# Patient Record
Sex: Female | Born: 1980 | Race: Black or African American | Hispanic: No | Marital: Single | Smoking: Never smoker
Health system: Southern US, Community
[De-identification: ages and names within clinical notes are randomized; demographics above are authoritative.]

## PROBLEM LIST (undated history)

## (undated) DIAGNOSIS — Z0389 Encounter for observation for other suspected diseases and conditions ruled out: Secondary | ICD-10-CM

## (undated) DIAGNOSIS — IMO0001 Reserved for inherently not codable concepts without codable children: Secondary | ICD-10-CM

## (undated) DIAGNOSIS — J42 Unspecified chronic bronchitis: Secondary | ICD-10-CM

## (undated) DIAGNOSIS — E785 Hyperlipidemia, unspecified: Secondary | ICD-10-CM

## (undated) DIAGNOSIS — J45909 Unspecified asthma, uncomplicated: Secondary | ICD-10-CM

## (undated) DIAGNOSIS — M1990 Unspecified osteoarthritis, unspecified site: Secondary | ICD-10-CM

## (undated) DIAGNOSIS — D649 Anemia, unspecified: Secondary | ICD-10-CM

## (undated) DIAGNOSIS — J449 Chronic obstructive pulmonary disease, unspecified: Secondary | ICD-10-CM

## (undated) DIAGNOSIS — G2581 Restless legs syndrome: Secondary | ICD-10-CM

## (undated) DIAGNOSIS — Z9889 Other specified postprocedural states: Secondary | ICD-10-CM

## (undated) DIAGNOSIS — I219 Acute myocardial infarction, unspecified: Secondary | ICD-10-CM

## (undated) DIAGNOSIS — R112 Nausea with vomiting, unspecified: Secondary | ICD-10-CM

## (undated) DIAGNOSIS — I10 Essential (primary) hypertension: Secondary | ICD-10-CM

## (undated) DIAGNOSIS — I739 Peripheral vascular disease, unspecified: Secondary | ICD-10-CM

## (undated) DIAGNOSIS — E109 Type 1 diabetes mellitus without complications: Secondary | ICD-10-CM

## (undated) DIAGNOSIS — G43909 Migraine, unspecified, not intractable, without status migrainosus: Secondary | ICD-10-CM

## (undated) DIAGNOSIS — I509 Heart failure, unspecified: Secondary | ICD-10-CM

## (undated) DIAGNOSIS — N189 Chronic kidney disease, unspecified: Secondary | ICD-10-CM

## (undated) DIAGNOSIS — I2510 Atherosclerotic heart disease of native coronary artery without angina pectoris: Secondary | ICD-10-CM

## (undated) DIAGNOSIS — J189 Pneumonia, unspecified organism: Secondary | ICD-10-CM

## (undated) DIAGNOSIS — R0602 Shortness of breath: Secondary | ICD-10-CM

## (undated) HISTORY — DX: Encounter for observation for other suspected diseases and conditions ruled out: Z03.89

## (undated) HISTORY — DX: Reserved for inherently not codable concepts without codable children: IMO0001

---
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
## 2004-09-29 HISTORY — PX: TUBAL LIGATION: SHX77

## 2013-05-25 ENCOUNTER — Emergency Department (HOSPITAL_COMMUNITY)
Admission: EM | Admit: 2013-05-25 | Discharge: 2013-05-25 | Disposition: A | Discharge status: Home / Self Care | Payer: Medicaid Other | Source: Home / Self Care

## 2013-05-25 ENCOUNTER — Encounter (HOSPITAL_COMMUNITY): Payer: Self-pay | Admitting: Emergency Medicine

## 2013-05-25 DIAGNOSIS — E119 Type 2 diabetes mellitus without complications: Secondary | ICD-10-CM

## 2013-05-25 DIAGNOSIS — L723 Sebaceous cyst: Secondary | ICD-10-CM

## 2013-05-25 DIAGNOSIS — L089 Local infection of the skin and subcutaneous tissue, unspecified: Secondary | ICD-10-CM

## 2013-05-25 DIAGNOSIS — I10 Essential (primary) hypertension: Secondary | ICD-10-CM

## 2013-05-25 HISTORY — DX: Unspecified asthma, uncomplicated: J45.909

## 2013-05-25 HISTORY — DX: Essential (primary) hypertension: I10

## 2013-05-25 LAB — GLUCOSE, CAPILLARY: Glucose-Capillary: 385 mg/dL — ABNORMAL HIGH (ref 70–99)

## 2013-05-25 MED ORDER — INSULIN ASPART 100 UNIT/ML FLEXPEN: 45.0000 [IU] | PEN_INJECTOR | Freq: Three times a day (TID) | SUBCUTANEOUS | Status: DC

## 2013-05-25 MED ORDER — LISINOPRIL 10 MG PO TABS: 20.0000 mg | ORAL_TABLET | Freq: Every day | ORAL | Status: DC

## 2013-05-25 MED ORDER — CLINDAMYCIN HCL 300 MG PO CAPS: 300.0000 mg | ORAL_CAPSULE | Freq: Three times a day (TID) | ORAL | Status: DC

## 2013-05-25 NOTE — ED Notes (Signed)
Visible abscess to left cheek.  White top on bump.  Bump painful.  Patient reports being new to area and out of medicine.  Out of insulin for over a month, out of lisinopril for 2 days.  New to [place], arrived in town this week.

## 2013-05-25 NOTE — ED Provider Notes (Addendum)
CSN: [id number]     Arrival date & time 05/25/13  1537 History   None    Chief Complaint  Patient presents with  . Abscess  . Medication Refill   (Consider location/radiation/quality/duration/timing/severity/associated sxs/prior Treatment) HPI Comments: 32 year old morbidly obese female recently moved to the area presents with a abscess-like structure to the left cheek. She complains of pain and tenderness locally. Denies systemic symptoms. She states that it drained some last night. She also has been out of her insulin and hypertension medications for approximately one month. She is requesting a refill on these. She does not have a physician yet and is waiting on Medicaid for the county to come through.   Past Medical History  Diagnosis Date  . Hypertension   . Diabetes mellitus without complication   . Asthma    Past Surgical History  Procedure Laterality Date  . Tonsillectomy    . Cesarean section    . Finger surgery     No family history on file. History  Substance Use Topics  . Smoking status: Never Smoker   . Smokeless tobacco: Not on file  . Alcohol Use: No   OB History   Grav Para Term Preterm Abortions TAB SAB Ect Mult Living                 Review of Systems  Constitutional: Negative.   Respiratory: Negative.   Cardiovascular: Negative.   Skin: Positive for wound.  Neurological: Negative.     Allergies  Aspirin; Sulfur; and Tramadol  Home Medications   Current Outpatient Rx  Name  Route  Sig  Dispense  Refill  . lisinopril (PRINIVIL,ZESTRIL) 20 MG tablet   Oral   Take 20 mg by mouth 2 (two) times daily.         . clindamycin (CLEOCIN) 300 MG capsule   Oral   Take 1 capsule (300 mg total) by mouth 3 (three) times daily.   21 capsule   0   . insulin aspart (NOVOLOG FLEXPEN) 100 UNIT/ML SOPN FlexPen   Subcutaneous   Inject 45 Units into the skin 3 (three) times daily with meals.   5 pen   0   . insulin aspart (NOVOLOG) 100 UNIT/ML  injection   Subcutaneous   Inject 45 Units into the skin 3 (three) times daily with meals.         Marland Kitchen lisinopril (PRINIVIL,ZESTRIL) 10 MG tablet   Oral   Take 2 tablets (20 mg total) by mouth daily.   60 tablet   0    BP 179/120  Pulse 134  Temp(Src) 98.1 F (36.7 C) (Oral)  Resp 20  SpO2 98%  LMP 04/30/2013 Physical Exam  Nursing note and vitals reviewed. Constitutional: She is oriented to person, place, and time. She appears well-developed and well-nourished. No distress.  HENT:  Head: Normocephalic and atraumatic.  Eyes: EOM are normal.  Neck: Normal range of motion. Neck supple.  Cardiovascular:  Sinus tachycardia  Pulmonary/Chest: Effort normal and breath sounds normal.  Musculoskeletal: She exhibits no edema.  Neurological: She is alert and oriented to person, place, and time. She exhibits normal muscle tone.  Skin: Skin is warm and dry.  There is a 2-1/2-3 cm raised red tender lesion to the left cheek. There is a central white annular crater approximately 3 mm in diameter; no current drainage.  Psychiatric: She has a normal mood and affect.    ED Course  INCISION AND DRAINAGE Date/Time: 05/25/2013 4:40 PM Performed by:  Tylon Kemmerling Authorized by: Phineas Real, Elvera Almario Consent: Verbal consent obtained. Risks and benefits: risks, benefits and alternatives were discussed Consent given by: patient Patient understanding: patient states understanding of the procedure being performed Patient identity confirmed: verbally with patient Type: cyst Body area: head/neck Location details: face Anesthesia: local infiltration Local anesthetic: lidocaine 2% with epinephrine Anesthetic total: 2 ml Patient sedated: no Scalpel size: 11 Incision type: single straight Complexity: simple Drainage: purulent and bloody Drainage amount: scant Wound treatment: wound left open Patient tolerance: Patient tolerated the procedure well with no immediate complications. Comments: A 0.5 cm  incision was made across central aspect of the raised lesion with a #11 blade. Manual expression allowed approximately 1/2 mm of purulent material with a small amount of blood.No further exploration made. The tissue expelled was consistent with cystic wall.   (including critical care time) Labs Review Labs Reviewed  GLUCOSE, CAPILLARY - Abnormal; Notable for the following:    Glucose-Capillary 385 (*)    All other components within normal limits  CULTURE, ROUTINE-ABSCESS   Imaging Review No results found.  MDM   1. Infected cyst of skin   2. T2DM (type 2 diabetes mellitus)   3. HTN (hypertension)    Refill insulin and lisinopril clinamycin for the facial infection Have the wound rechecked in 2 days for any problems or if not improving Warm compresses frequently  Hayden Rasmussen, NP 05/25/13 1703  Hayden Rasmussen, NP 05/25/13 1711  Hayden Rasmussen, NP 05/25/13 1713  Hayden Rasmussen, NP 05/29/13 1945

## 2013-05-28 LAB — CULTURE, ROUTINE-ABSCESS

## 2013-05-28 NOTE — ED Provider Notes (Signed)
Medical screening examination/treatment/procedure(s) were performed by a resident physician or non-physician practitioner and as the supervising physician I was immediately available for consultation/collaboration.  Clementeen Graham, MD   Rodolph Bong, MD 05/28/13 [phone]

## 2013-05-29 ENCOUNTER — Telehealth (HOSPITAL_COMMUNITY): Payer: Self-pay | Admitting: *Deleted

## 2013-05-29 MED ORDER — DOXYCYCLINE HYCLATE 100 MG PO CAPS: 100.0000 mg | ORAL_CAPSULE | Freq: Two times a day (BID) | ORAL | Status: DC

## 2013-05-29 NOTE — ED Notes (Signed)
Abscess culture L face: Mod. MRSA.  Pt. treated with Cleocin-resistant.  Lab shown to Hayden Rasmussen NP and order obtained for Doxycycline 100 mg. BID x 7 days. I called pt.  Pt. verified x 2 and given results. Pt. told to stop Cleocin and take all of Doxycycline. I reviewed the [facility] MRSA instructions with her. She voiced understanding.  Pt. wants Rx. called to [facility]. [place], [place].  I called Rx. to the pharmacist @ [phone]. Vassie Moselle 05/29/2013

## 2013-05-31 NOTE — ED Provider Notes (Signed)
Medical screening examination/treatment/procedure(s) were performed by a resident physician or non-physician practitioner and as the supervising physician I was immediately available for consultation/collaboration.  Meilyn Heindl, MD   Kendyl Bissonnette S Mariette Cowley, MD 05/31/13 0754 

## 2013-09-06 ENCOUNTER — Emergency Department (HOSPITAL_COMMUNITY): Payer: Medicaid Other

## 2013-09-06 ENCOUNTER — Emergency Department (HOSPITAL_COMMUNITY)
Admission: EM | Admit: 2013-09-06 | Discharge: 2013-09-06 | Disposition: A | Discharge status: Home / Self Care | Payer: Medicaid Other | Attending: Emergency Medicine | Admitting: Emergency Medicine

## 2013-09-06 ENCOUNTER — Encounter (HOSPITAL_COMMUNITY): Payer: Self-pay | Admitting: Emergency Medicine

## 2013-09-06 DIAGNOSIS — R6883 Chills (without fever): Secondary | ICD-10-CM | POA: Insufficient documentation

## 2013-09-06 DIAGNOSIS — Z79899 Other long term (current) drug therapy: Secondary | ICD-10-CM | POA: Insufficient documentation

## 2013-09-06 DIAGNOSIS — Z794 Long term (current) use of insulin: Secondary | ICD-10-CM | POA: Insufficient documentation

## 2013-09-06 DIAGNOSIS — D649 Anemia, unspecified: Secondary | ICD-10-CM

## 2013-09-06 DIAGNOSIS — J45901 Unspecified asthma with (acute) exacerbation: Secondary | ICD-10-CM | POA: Insufficient documentation

## 2013-09-06 DIAGNOSIS — J3489 Other specified disorders of nose and nasal sinuses: Secondary | ICD-10-CM | POA: Insufficient documentation

## 2013-09-06 DIAGNOSIS — I10 Essential (primary) hypertension: Secondary | ICD-10-CM | POA: Insufficient documentation

## 2013-09-06 DIAGNOSIS — Z792 Long term (current) use of antibiotics: Secondary | ICD-10-CM | POA: Insufficient documentation

## 2013-09-06 DIAGNOSIS — E119 Type 2 diabetes mellitus without complications: Secondary | ICD-10-CM | POA: Insufficient documentation

## 2013-09-06 DIAGNOSIS — R000 Tachycardia, unspecified: Secondary | ICD-10-CM

## 2013-09-06 DIAGNOSIS — N289 Disorder of kidney and ureter, unspecified: Secondary | ICD-10-CM

## 2013-09-06 DIAGNOSIS — J40 Bronchitis, not specified as acute or chronic: Secondary | ICD-10-CM

## 2013-09-06 LAB — BASIC METABOLIC PANEL
BUN: 15 mg/dL (ref 6–23)
Chloride: 102 mEq/L (ref 96–112)
Creatinine, Ser: 2.07 mg/dL — ABNORMAL HIGH (ref 0.50–1.10)
GFR calc Af Amer: 35 mL/min — ABNORMAL LOW (ref >90)
GFR calc non Af Amer: 30 mL/min — ABNORMAL LOW (ref >90)
Glucose, Bld: 165 mg/dL — ABNORMAL HIGH (ref 70–99)

## 2013-09-06 LAB — CBC
HCT: 30.7 % — ABNORMAL LOW (ref 36.0–46.0)
Hemoglobin: 9.9 g/dL — ABNORMAL LOW (ref 12.0–15.0)
MCHC: 32.2 g/dL (ref 30.0–36.0)
MCV: 85 fL (ref 78.0–100.0)
RDW: 13.4 % (ref 11.5–15.5)

## 2013-09-06 MED ORDER — ATORVASTATIN CALCIUM 40 MG PO TABS: 40.0000 mg | ORAL_TABLET | Freq: Every day | ORAL | Status: DC

## 2013-09-06 MED ORDER — INSULIN ASPART 100 UNIT/ML FLEXPEN: 45.0000 [IU] | PEN_INJECTOR | Freq: Three times a day (TID) | SUBCUTANEOUS | Status: DC

## 2013-09-06 MED ORDER — HYDROCHLOROTHIAZIDE 25 MG PO TABS: 25.0000 mg | ORAL_TABLET | Freq: Every day | ORAL | Status: DC

## 2013-09-06 MED ORDER — PREDNISONE 20 MG PO TABS: 40.0000 mg | ORAL_TABLET | Freq: Every day | ORAL | Status: DC

## 2013-09-06 MED ORDER — AMLODIPINE BESYLATE 10 MG PO TABS: 10.0000 mg | ORAL_TABLET | Freq: Every day | ORAL | Status: DC

## 2013-09-06 MED ORDER — ALBUTEROL SULFATE HFA 108 (90 BASE) MCG/ACT IN AERS
2.0000 | INHALATION_SPRAY | Freq: Once | RESPIRATORY_TRACT | Status: AC
  Administered 2013-09-06: 2 via RESPIRATORY_TRACT
  Filled 2013-09-06: qty 6.7

## 2013-09-06 MED ORDER — PREDNISONE 20 MG PO TABS
40.0000 mg | ORAL_TABLET | Freq: Once | ORAL | Status: AC
  Administered 2013-09-06: 40 mg via ORAL
  Filled 2013-09-06: qty 2

## 2013-09-06 MED ORDER — GUAIFENESIN-CODEINE 100-10 MG/5ML PO SOLN: 10.0000 mL | Freq: Four times a day (QID) | ORAL | Status: DC | PRN

## 2013-09-06 MED ORDER — SPIRONOLACTONE 25 MG PO TABS: 25.0000 mg | ORAL_TABLET | Freq: Every day | ORAL | Status: DC

## 2013-09-06 NOTE — ED Notes (Signed)
NOTIFIED DR. I. KNAPP OF PATIENTS LAB RESULTS OF CG4 LACTIC ACID = 1.90mmoI/L ,11:30 AM, 09/06/2013.

## 2013-09-06 NOTE — ED Provider Notes (Signed)
CSN: [id number]     Arrival date & time 09/06/13  0910 History   First MD Initiated Contact with Patient 09/06/13 1117     Chief Complaint  Patient presents with  . Cough   (Consider location/radiation/quality/duration/timing/severity/associated sxs/prior Treatment) HPI Yeva Bissette is a 32 y.o. female who presents to emergency department complaining of cough and congestion. States symptoms began 2 weeks ago. 5 days ago she went to an urgent care and was told she may have pneumonia. Patient did not have a chest x-ray done at that time. She was placed on levofloxacin which she has been taking for 4 days now. States that her cough is not improving. States that she has a cough attacks every few minutes and unable to stop. States this cough is making her uncomfortable and she's unable to sleep at night. Patient states today she has been feeling short of breath. Patient states that she has not been taking her blood pressure medications and is running out of her insulin is requesting refills. Patient denies smoking. She states that she has asthma. She states that she does nebulize treatments with her daughter's machine at home. States she had one treatment yesterday and none today. States her daughter is sick with the same. Patient admits to chills, had fever of 4 days ago but none at present. Patient denies any sore throat or chest pain. States she does have nasal congestion. Pt is not taking any other medications for her symptoms.   Past Medical History  Diagnosis Date  . Hypertension   . Diabetes mellitus without complication   . Asthma    Past Surgical History  Procedure Laterality Date  . Tonsillectomy    . Cesarean section    . Finger surgery     History reviewed. No pertinent family history. History  Substance Use Topics  . Smoking status: Never Smoker   . Smokeless tobacco: Not on file  . Alcohol Use: No   OB History   Grav Para Term Preterm Abortions TAB SAB Ect Mult Living                  Review of Systems  Constitutional: Positive for chills. Negative for fever.  HENT: Positive for congestion. Negative for ear pain, sinus pressure, sneezing, sore throat, trouble swallowing and voice change.   Respiratory: Positive for cough, chest tightness and shortness of breath. Negative for wheezing.   Cardiovascular: Negative for chest pain, palpitations and leg swelling.  Gastrointestinal: Negative for nausea, vomiting, abdominal pain and diarrhea.  Genitourinary: Negative for dysuria and flank pain.  Musculoskeletal: Negative for arthralgias, myalgias, neck pain and neck stiffness.  Skin: Negative for rash.  Neurological: Negative for dizziness, weakness and headaches.  All other systems reviewed and are negative.    Allergies  Aspirin; Sulfur; and Tramadol  Home Medications   Current Outpatient Rx  Name  Route  Sig  Dispense  Refill  . doxycycline (VIBRAMYCIN) 100 MG capsule   Oral   Take 1 capsule (100 mg total) by mouth 2 (two) times daily. Call pt when Rx ready   14 capsule   0   . doxycycline (VIBRAMYCIN) 100 MG capsule   Oral   Take 1 capsule (100 mg total) by mouth 2 (two) times daily.   20 capsule   0   . insulin aspart (NOVOLOG FLEXPEN) 100 UNIT/ML SOPN FlexPen   Subcutaneous   Inject 45 Units into the skin 3 (three) times daily with meals.   5 pen  0   . insulin aspart (NOVOLOG) 100 UNIT/ML injection   Subcutaneous   Inject 45 Units into the skin 3 (three) times daily with meals.         Marland Kitchen lisinopril (PRINIVIL,ZESTRIL) 10 MG tablet   Oral   Take 2 tablets (20 mg total) by mouth daily.   60 tablet   0   . lisinopril (PRINIVIL,ZESTRIL) 20 MG tablet   Oral   Take 20 mg by mouth 2 (two) times daily.          BP 190/105  Pulse 120  Temp(Src) 97.7 F (36.5 C) (Oral)  Resp 18  SpO2 100%  LMP 07/30/2013 Physical Exam  Nursing note and vitals reviewed. Constitutional: She appears well-developed and well-nourished. No distress.   HENT:  Head: Normocephalic.  Right Ear: External ear normal.  Left Ear: External ear normal.  Nose: Nose normal.  Mouth/Throat: Oropharynx is clear and moist. No oropharyngeal exudate.  Eyes: Conjunctivae are normal.  Neck: Neck supple.  Cardiovascular: Normal rate, regular rhythm and normal heart sounds.   Pulmonary/Chest: Effort normal and breath sounds normal. No respiratory distress. She has no rales.  Abdominal: Soft. Bowel sounds are normal. She exhibits no distension. There is no tenderness. There is no rebound.  Musculoskeletal: She exhibits no edema.  Neurological: She is alert.  Skin: Skin is warm and dry.  Psychiatric: She has a normal mood and affect. Her behavior is normal.    ED Course  Procedures (including critical care time) Labs Review Labs Reviewed  CBC - Abnormal; Notable for the following:    RBC 3.61 (*)    Hemoglobin 9.9 (*)    HCT 30.7 (*)    Platelets 494 (*)    All other components within normal limits  BASIC METABOLIC PANEL - Abnormal; Notable for the following:    Sodium 133 (*)    Glucose, Bld 165 (*)    Creatinine, Ser 2.07 (*)    GFR calc non Af Amer 30 (*)    GFR calc Af Amer 35 (*)    All other components within normal limits  CG4 I-STAT (LACTIC ACID)   Imaging Review Dg Chest 2 View  09/06/2013   CLINICAL DATA:  Cough, fever.  EXAM: CHEST  2 VIEW  COMPARISON:  None.  FINDINGS: The heart size and mediastinal contours are within normal limits. No pneumothorax or pleural effusion is noted. Bilateral peribronchial thickening is noted concerning for bronchitis or possibly perihilar pneumonitis. The visualized skeletal structures are unremarkable.  IMPRESSION: Findings consistent with either bilateral bronchitis or perihilar pneumonitis.   Electronically Signed   By: Roque Lias M.D.   On: 09/06/2013 11:07    EKG Interpretation   None       MDM   1. Bronchitis   2. Tachycardia   3. Anemia   4. Renal insufficiency     Patient with  persistent cough for 2 weeks, worsening. She is already on antibiotics, states cough is not improving. Patient persistently coughing in the room. Ordered an inhaler, 2 puffs. Patient's chest x-ray is as described above. Her lab work shows elevated creatinine and hemoglobin of 9.9. Patient is aware, states she has history of the same. Patient just moved to the area does not have a primary care Dr. She has not been taking any of her medications. Ran out of her blood pressure and insulin medications. Will refill. Will give numbers to try find a primary care Dr. At this time I do not think  patient needs admission. Her blood pressure came down with recheck, she will need to continue to take her blood pressure medications. Her HR still elevated, most likely due to dehydration and persistent coughing. I ordered her prednisone, explained may elevate her blood sugars. I also considered PE, given tachycardic and SOB, however, she has no risk factors, oxygen sat 100% on RA, and at this time, most likely diagnosis is bronchitis. She does have hx of asthma.    Lottie Mussel, PA-C 09/06/13 1310

## 2013-09-06 NOTE — ED Provider Notes (Signed)
Medical screening examination/treatment/procedure(s) were performed by non-physician practitioner and as supervising physician I was immediately available for consultation/collaboration.  EKG Interpretation   None       Devoria Albe, MD, Armando Gang   Ward Givens, MD 09/06/13 1320

## 2013-09-06 NOTE — ED Notes (Addendum)
Pt sts continued cough with pain with cough x 5 days; pt sts taking antibiotics x 4 days for PNA but not helping; pt sts some SOB today; pt sts not taking BP meds x months

## 2013-09-15 ENCOUNTER — Emergency Department (HOSPITAL_COMMUNITY): Payer: Medicaid Other

## 2013-09-15 ENCOUNTER — Encounter (HOSPITAL_COMMUNITY): Payer: Self-pay | Admitting: Emergency Medicine

## 2013-09-15 ENCOUNTER — Inpatient Hospital Stay (HOSPITAL_COMMUNITY)
Admission: EM | Admit: 2013-09-15 | Discharge: 2013-09-22 | DRG: 193 | Disposition: A | Discharge status: Home / Self Care | Payer: Medicaid Other | Attending: Internal Medicine | Admitting: Internal Medicine

## 2013-09-15 DIAGNOSIS — I5043 Acute on chronic combined systolic (congestive) and diastolic (congestive) heart failure: Secondary | ICD-10-CM

## 2013-09-15 DIAGNOSIS — E1129 Type 2 diabetes mellitus with other diabetic kidney complication: Secondary | ICD-10-CM | POA: Diagnosis present

## 2013-09-15 DIAGNOSIS — I509 Heart failure, unspecified: Secondary | ICD-10-CM | POA: Diagnosis not present

## 2013-09-15 DIAGNOSIS — T380X5A Adverse effect of glucocorticoids and synthetic analogues, initial encounter: Secondary | ICD-10-CM | POA: Diagnosis present

## 2013-09-15 DIAGNOSIS — D649 Anemia, unspecified: Secondary | ICD-10-CM | POA: Diagnosis present

## 2013-09-15 DIAGNOSIS — R600 Localized edema: Secondary | ICD-10-CM

## 2013-09-15 DIAGNOSIS — IMO0002 Reserved for concepts with insufficient information to code with codable children: Secondary | ICD-10-CM | POA: Diagnosis present

## 2013-09-15 DIAGNOSIS — R0602 Shortness of breath: Secondary | ICD-10-CM

## 2013-09-15 DIAGNOSIS — D638 Anemia in other chronic diseases classified elsewhere: Secondary | ICD-10-CM | POA: Diagnosis present

## 2013-09-15 DIAGNOSIS — I428 Other cardiomyopathies: Secondary | ICD-10-CM | POA: Diagnosis present

## 2013-09-15 DIAGNOSIS — I429 Cardiomyopathy, unspecified: Secondary | ICD-10-CM

## 2013-09-15 DIAGNOSIS — R9439 Abnormal result of other cardiovascular function study: Secondary | ICD-10-CM

## 2013-09-15 DIAGNOSIS — I129 Hypertensive chronic kidney disease with stage 1 through stage 4 chronic kidney disease, or unspecified chronic kidney disease: Secondary | ICD-10-CM | POA: Diagnosis present

## 2013-09-15 DIAGNOSIS — R059 Cough, unspecified: Secondary | ICD-10-CM

## 2013-09-15 DIAGNOSIS — J441 Chronic obstructive pulmonary disease with (acute) exacerbation: Secondary | ICD-10-CM | POA: Diagnosis present

## 2013-09-15 DIAGNOSIS — E860 Dehydration: Secondary | ICD-10-CM | POA: Diagnosis present

## 2013-09-15 DIAGNOSIS — R000 Tachycardia, unspecified: Secondary | ICD-10-CM | POA: Diagnosis present

## 2013-09-15 DIAGNOSIS — R112 Nausea with vomiting, unspecified: Secondary | ICD-10-CM | POA: Diagnosis present

## 2013-09-15 DIAGNOSIS — I10 Essential (primary) hypertension: Secondary | ICD-10-CM | POA: Diagnosis present

## 2013-09-15 DIAGNOSIS — Z6841 Body Mass Index (BMI) 40.0 and over, adult: Secondary | ICD-10-CM

## 2013-09-15 DIAGNOSIS — M19049 Primary osteoarthritis, unspecified hand: Secondary | ICD-10-CM | POA: Diagnosis present

## 2013-09-15 DIAGNOSIS — N189 Chronic kidney disease, unspecified: Secondary | ICD-10-CM

## 2013-09-15 DIAGNOSIS — E785 Hyperlipidemia, unspecified: Secondary | ICD-10-CM | POA: Diagnosis present

## 2013-09-15 DIAGNOSIS — E1029 Type 1 diabetes mellitus with other diabetic kidney complication: Secondary | ICD-10-CM | POA: Diagnosis present

## 2013-09-15 DIAGNOSIS — R609 Edema, unspecified: Secondary | ICD-10-CM

## 2013-09-15 DIAGNOSIS — I519 Heart disease, unspecified: Secondary | ICD-10-CM

## 2013-09-15 DIAGNOSIS — I5041 Acute combined systolic (congestive) and diastolic (congestive) heart failure: Secondary | ICD-10-CM | POA: Diagnosis present

## 2013-09-15 DIAGNOSIS — R0902 Hypoxemia: Secondary | ICD-10-CM

## 2013-09-15 DIAGNOSIS — N049 Nephrotic syndrome with unspecified morphologic changes: Secondary | ICD-10-CM | POA: Diagnosis present

## 2013-09-15 DIAGNOSIS — E119 Type 2 diabetes mellitus without complications: Secondary | ICD-10-CM

## 2013-09-15 DIAGNOSIS — E1065 Type 1 diabetes mellitus with hyperglycemia: Secondary | ICD-10-CM | POA: Diagnosis present

## 2013-09-15 DIAGNOSIS — N179 Acute kidney failure, unspecified: Secondary | ICD-10-CM

## 2013-09-15 DIAGNOSIS — I2510 Atherosclerotic heart disease of native coronary artery without angina pectoris: Secondary | ICD-10-CM | POA: Diagnosis present

## 2013-09-15 DIAGNOSIS — R05 Cough: Secondary | ICD-10-CM | POA: Insufficient documentation

## 2013-09-15 DIAGNOSIS — N184 Chronic kidney disease, stage 4 (severe): Secondary | ICD-10-CM | POA: Diagnosis present

## 2013-09-15 DIAGNOSIS — J189 Pneumonia, unspecified organism: Principal | ICD-10-CM

## 2013-09-15 DIAGNOSIS — J45901 Unspecified asthma with (acute) exacerbation: Secondary | ICD-10-CM

## 2013-09-15 DIAGNOSIS — N058 Unspecified nephritic syndrome with other morphologic changes: Secondary | ICD-10-CM | POA: Diagnosis present

## 2013-09-15 DIAGNOSIS — J9600 Acute respiratory failure, unspecified whether with hypoxia or hypercapnia: Secondary | ICD-10-CM | POA: Diagnosis present

## 2013-09-15 HISTORY — DX: Unspecified chronic bronchitis: J42

## 2013-09-15 HISTORY — DX: Chronic kidney disease, unspecified: N18.9

## 2013-09-15 HISTORY — DX: Anemia, unspecified: D64.9

## 2013-09-15 HISTORY — DX: Shortness of breath: R06.02

## 2013-09-15 HISTORY — DX: Hyperlipidemia, unspecified: E78.5

## 2013-09-15 HISTORY — DX: Unspecified osteoarthritis, unspecified site: M19.90

## 2013-09-15 HISTORY — DX: Pneumonia, unspecified organism: J18.9

## 2013-09-15 HISTORY — DX: Migraine, unspecified, not intractable, without status migrainosus: G43.909

## 2013-09-15 LAB — CBC WITH DIFFERENTIAL/PLATELET
Basophils Absolute: 0 K/uL (ref 0.0–0.1)
Basophils Relative: 1 % (ref 0–1)
Eosinophils Absolute: 0 10*3/uL (ref 0.0–0.7)
Eosinophils Relative: 0 % (ref 0–5)
HCT: 30 % — ABNORMAL LOW (ref 36.0–46.0)
Hemoglobin: 9.9 g/dL — ABNORMAL LOW (ref 12.0–15.0)
Lymphocytes Relative: 44 % (ref 12–46)
Lymphs Abs: 1.8 10*3/uL (ref 0.7–4.0)
MCH: 27.2 pg (ref 26.0–34.0)
MCHC: 33 g/dL (ref 30.0–36.0)
MCV: 82.4 fL (ref 78.0–100.0)
Monocytes Absolute: 0.3 K/uL (ref 0.1–1.0)
Monocytes Relative: 7 % (ref 3–12)
Neutro Abs: 2 K/uL (ref 1.7–7.7)
Neutrophils Relative %: 49 % (ref 43–77)
Platelets: 312 K/uL (ref 150–400)
RBC: 3.64 MIL/uL — ABNORMAL LOW (ref 3.87–5.11)
RDW: 13.6 % (ref 11.5–15.5)
WBC: 4 K/uL (ref 4.0–10.5)

## 2013-09-15 LAB — CBC
Hemoglobin: 9 g/dL — ABNORMAL LOW (ref 12.0–15.0)
MCH: 27.5 pg (ref 26.0–34.0)
MCV: 82 fL (ref 78.0–100.0)
Platelets: 263 10*3/uL (ref 150–400)
RBC: 3.27 MIL/uL — ABNORMAL LOW (ref 3.87–5.11)
RDW: 13.6 % (ref 11.5–15.5)
WBC: 7.7 10*3/uL (ref 4.0–10.5)

## 2013-09-15 LAB — URINE MICROSCOPIC-ADD ON

## 2013-09-15 LAB — URINALYSIS, ROUTINE W REFLEX MICROSCOPIC
Glucose, UA: 250 mg/dL — AB
Ketones, ur: NEGATIVE mg/dL
Leukocytes, UA: NEGATIVE
Nitrite: NEGATIVE
Specific Gravity, Urine: 1.015 (ref 1.005–1.030)
pH: 6 (ref 5.0–8.0)

## 2013-09-15 LAB — STREP PNEUMONIAE URINARY ANTIGEN: Strep Pneumo Urinary Antigen: NEGATIVE

## 2013-09-15 LAB — COMPREHENSIVE METABOLIC PANEL WITH GFR
ALT: 13 U/L (ref 0–35)
AST: 20 U/L (ref 0–37)
Alkaline Phosphatase: 76 U/L (ref 39–117)
CO2: 21 meq/L (ref 19–32)
Chloride: 99 meq/L (ref 96–112)
GFR calc non Af Amer: 22 mL/min — ABNORMAL LOW (ref >90)
Sodium: 131 meq/L — ABNORMAL LOW (ref 135–145)
Total Bilirubin: 0.2 mg/dL — ABNORMAL LOW (ref 0.3–1.2)

## 2013-09-15 LAB — COMPREHENSIVE METABOLIC PANEL
Albumin: 1.4 g/dL — ABNORMAL LOW (ref 3.5–5.2)
BUN: 39 mg/dL — ABNORMAL HIGH (ref 6–23)
Calcium: 8.1 mg/dL — ABNORMAL LOW (ref 8.4–10.5)
Creatinine, Ser: 2.72 mg/dL — ABNORMAL HIGH (ref 0.50–1.10)
GFR calc Af Amer: 25 mL/min — ABNORMAL LOW (ref >90)
Glucose, Bld: 184 mg/dL — ABNORMAL HIGH (ref 70–99)
Potassium: 3.6 mEq/L (ref 3.5–5.1)
Total Protein: 6.1 g/dL (ref 6.0–8.3)

## 2013-09-15 LAB — CREATININE, SERUM
Creatinine, Ser: 2.66 mg/dL — ABNORMAL HIGH (ref 0.50–1.10)
GFR calc Af Amer: 26 mL/min — ABNORMAL LOW (ref >90)
GFR calc non Af Amer: 22 mL/min — ABNORMAL LOW (ref >90)

## 2013-09-15 LAB — GLUCOSE, CAPILLARY: Glucose-Capillary: 179 mg/dL — ABNORMAL HIGH (ref 70–99)

## 2013-09-15 LAB — PRO B NATRIURETIC PEPTIDE: Pro B Natriuretic peptide (BNP): 7879 pg/mL — ABNORMAL HIGH (ref 0–125)

## 2013-09-15 MED ORDER — IPRATROPIUM BROMIDE 0.02 % IN SOLN
1.0000 mg | Freq: Once | RESPIRATORY_TRACT | Status: AC
  Administered 2013-09-15: 1 mg via RESPIRATORY_TRACT
  Filled 2013-09-15: qty 5

## 2013-09-15 MED ORDER — ALBUTEROL SULFATE (5 MG/ML) 0.5% IN NEBU
2.5000 mg | INHALATION_SOLUTION | RESPIRATORY_TRACT | Status: DC
  Administered 2013-09-15: 21:00:00 2.5 mg via RESPIRATORY_TRACT

## 2013-09-15 MED ORDER — ALBUTEROL SULFATE (5 MG/ML) 0.5% IN NEBU
5.0000 mg | INHALATION_SOLUTION | Freq: Once | RESPIRATORY_TRACT | Status: DC
  Filled 2013-09-15: qty 1

## 2013-09-15 MED ORDER — SODIUM CHLORIDE 0.9 % IV SOLN
INTRAVENOUS | Status: DC
  Administered 2013-09-16: 12:00:00 75 mL via INTRAVENOUS

## 2013-09-15 MED ORDER — ALBUTEROL SULFATE (5 MG/ML) 0.5% IN NEBU
2.5000 mg | INHALATION_SOLUTION | Freq: Three times a day (TID) | RESPIRATORY_TRACT | Status: DC
  Administered 2013-09-16: 09:00:00 2.5 mg via RESPIRATORY_TRACT
  Filled 2013-09-15: qty 0.5

## 2013-09-15 MED ORDER — IPRATROPIUM BROMIDE 0.02 % IN SOLN
0.5000 mg | Freq: Three times a day (TID) | RESPIRATORY_TRACT | Status: DC
  Administered 2013-09-16: 09:00:00 0.5 mg via RESPIRATORY_TRACT
  Filled 2013-09-15: qty 2.5

## 2013-09-15 MED ORDER — DEXTROSE 5 % IV SOLN: 1.0000 g | INTRAVENOUS | Status: DC

## 2013-09-15 MED ORDER — DEXTROSE 5 % IV SOLN
1.0000 g | Freq: Once | INTRAVENOUS | Status: AC
  Administered 2013-09-15: 1 g via INTRAVENOUS
  Filled 2013-09-15: qty 10

## 2013-09-15 MED ORDER — HYDROCOD POLST-CHLORPHEN POLST 10-8 MG/5ML PO LQCR
5.0000 mL | Freq: Two times a day (BID) | ORAL | Status: DC
  Administered 2013-09-15 – 2013-09-22 (×13): 5 mL via ORAL
  Filled 2013-09-15 (×14): qty 5

## 2013-09-15 MED ORDER — ATORVASTATIN CALCIUM 40 MG PO TABS
40.0000 mg | ORAL_TABLET | Freq: Every day | ORAL | Status: DC
  Administered 2013-09-15 – 2013-09-22 (×7): 40 mg via ORAL
  Filled 2013-09-15 (×8): qty 1

## 2013-09-15 MED ORDER — SODIUM CHLORIDE 0.9 % IV SOLN
1000.0000 mL | INTRAVENOUS | Status: DC
  Administered 2013-09-15: 1000 mL via INTRAVENOUS

## 2013-09-15 MED ORDER — INSULIN ASPART 100 UNIT/ML ~~LOC~~ SOLN
0.0000 [IU] | Freq: Every day | SUBCUTANEOUS | Status: DC
  Administered 2013-09-16 – 2013-09-18 (×2): 2 [IU] via SUBCUTANEOUS
  Administered 2013-09-19 – 2013-09-20 (×2): 4 [IU] via SUBCUTANEOUS

## 2013-09-15 MED ORDER — INSULIN ASPART 100 UNIT/ML ~~LOC~~ SOLN
6.0000 [IU] | Freq: Three times a day (TID) | SUBCUTANEOUS | Status: DC
  Administered 2013-09-16: 08:00:00 6 [IU] via SUBCUTANEOUS

## 2013-09-15 MED ORDER — DEXTROSE 5 % IV SOLN
1.0000 g | INTRAVENOUS | Status: DC
  Administered 2013-09-16 – 2013-09-19 (×4): 1 g via INTRAVENOUS
  Filled 2013-09-15 (×5): qty 10

## 2013-09-15 MED ORDER — ONDANSETRON HCL 4 MG/2ML IJ SOLN: 4.0000 mg | Freq: Four times a day (QID) | INTRAMUSCULAR | Status: DC | PRN

## 2013-09-15 MED ORDER — ENOXAPARIN SODIUM 40 MG/0.4ML ~~LOC~~ SOLN
40.0000 mg | SUBCUTANEOUS | Status: DC
  Administered 2013-09-15 – 2013-09-17 (×3): 40 mg via SUBCUTANEOUS
  Filled 2013-09-15 (×8): qty 0.4

## 2013-09-15 MED ORDER — DEXTROSE 5 % IV SOLN
500.0000 mg | INTRAVENOUS | Status: DC
  Filled 2013-09-15: qty 500

## 2013-09-15 MED ORDER — DEXTROSE 5 % IV SOLN: 500.0000 mg | Freq: Once | INTRAVENOUS | Status: DC

## 2013-09-15 MED ORDER — AMLODIPINE BESYLATE 10 MG PO TABS
10.0000 mg | ORAL_TABLET | Freq: Every day | ORAL | Status: DC
  Administered 2013-09-16 – 2013-09-17 (×2): 10 mg via ORAL
  Filled 2013-09-15 (×2): qty 1

## 2013-09-15 MED ORDER — ALBUTEROL SULFATE HFA 108 (90 BASE) MCG/ACT IN AERS: 2.0000 | INHALATION_SPRAY | RESPIRATORY_TRACT | Status: DC | PRN

## 2013-09-15 MED ORDER — ALBUTEROL SULFATE (5 MG/ML) 0.5% IN NEBU
2.5000 mg | INHALATION_SOLUTION | RESPIRATORY_TRACT | Status: DC
  Filled 2013-09-15: qty 0.5

## 2013-09-15 MED ORDER — INSULIN ASPART 100 UNIT/ML ~~LOC~~ SOLN
0.0000 [IU] | Freq: Three times a day (TID) | SUBCUTANEOUS | Status: DC
  Administered 2013-09-16: 18:00:00 15 [IU] via SUBCUTANEOUS
  Administered 2013-09-16: 20 [IU] via SUBCUTANEOUS
  Administered 2013-09-16: 15 [IU] via SUBCUTANEOUS
  Administered 2013-09-17: 14:00:00 7 [IU] via SUBCUTANEOUS
  Administered 2013-09-17: 11 [IU] via SUBCUTANEOUS
  Administered 2013-09-17: 4 [IU] via SUBCUTANEOUS
  Administered 2013-09-18 (×2): 7 [IU] via SUBCUTANEOUS
  Administered 2013-09-18: 11 [IU] via SUBCUTANEOUS
  Administered 2013-09-19: 09:00:00 4 [IU] via SUBCUTANEOUS
  Administered 2013-09-19: 11 [IU] via SUBCUTANEOUS
  Administered 2013-09-20 – 2013-09-21 (×3): 4 [IU] via SUBCUTANEOUS
  Administered 2013-09-22: 7 [IU] via SUBCUTANEOUS
  Administered 2013-09-22: 11:00:00 4 [IU] via SUBCUTANEOUS

## 2013-09-15 MED ORDER — SODIUM CHLORIDE 0.9 % IV SOLN
1000.0000 mL | Freq: Once | INTRAVENOUS | Status: AC
  Administered 2013-09-15: 1000 mL via INTRAVENOUS

## 2013-09-15 MED ORDER — METHYLPREDNISOLONE SODIUM SUCC 40 MG IJ SOLR
40.0000 mg | Freq: Two times a day (BID) | INTRAMUSCULAR | Status: DC
  Administered 2013-09-15 – 2013-09-17 (×4): 40 mg via INTRAVENOUS
  Filled 2013-09-15 (×6): qty 1

## 2013-09-15 MED ORDER — IPRATROPIUM BROMIDE 0.02 % IN SOLN
0.5000 mg | RESPIRATORY_TRACT | Status: DC
  Administered 2013-09-15: 0.5 mg via RESPIRATORY_TRACT
  Filled 2013-09-15: qty 2.5

## 2013-09-15 MED ORDER — ALBUTEROL (5 MG/ML) CONTINUOUS INHALATION SOLN
10.0000 mg/h | INHALATION_SOLUTION | RESPIRATORY_TRACT | Status: AC
  Administered 2013-09-15: 10 mg/h via RESPIRATORY_TRACT
  Filled 2013-09-15: qty 20

## 2013-09-15 NOTE — H&P (Signed)
[facility] History and Physical  [name] ZOX:[id number] DOB: October 21, 1980 DOA: 09/15/2013  Referring physician: Dr. Quita Skye PCP: Pcp Not In System   Chief Complaint: Cough, vomiting, dyspnea, wheezing   History of Present Illness: [name] is an 32 y.o. female with a PMH of asthma, HTN, DM who originally presented to [facility] on 09/02/13 with dyspnea and cough and then presented to the ER on 09/06/13 with persistent symptoms.  CXR done on that day showed bronchitis versus a perihilar pneumonitis.  She was started on prednisone and instructed to complete her previously prescribed course of Levaquin.  She now returns after completing her Levaquin with complaints of persistent symptoms including nausea, vomiting, diarrhea, lightheadedness, persistent shortness of breath and cough. She has used her home MDI and nebulizer with mild improvement in her shortness of breath, but no change in the cough. She is having posttussive emesis with coughing spells. She has had decreased oral intake. Upon evaluation in the ED, the patient was noted to have acute renal failure with creatinine of 2.72. Chest x-ray reveals mild worsening of the patchy bilateral airspace process, worse over the left base, likely due to infection.  She has not had a flu shot this year, and denies sick contacts.  Review of Systems: Constitutional: + fever, + chills;  Appetite decreased; No weight loss, no weight gain, + fatigue.  HEENT: No blurry vision, no diplopia, no pharyngitis, no dysphagia CV: No chest pain, no palpitations, no PND.  Resp: + SOB, + cough productive of thick brown sputum, + pleuritic pain. GI: + nausea, + vomiting, + diarrhea, no melena, no hematochezia, no constipation.  GU: No dysuria, no hematuria, no frequency, no urgency. MSK: no myalgias, no arthralgias.  Neuro:  No headache, no focal neurological deficits, no history of seizures.  Psych: No depression, no anxiety.  Endo: No heat intolerance, no cold  intolerance, no polyuria, no polydipsia, LMP 1 week ago Skin: No rashes, no skin lesions.  Heme: No easy bruising.  Travel history: No recent travel.  Past Medical History Past Medical History  Diagnosis Date  . Hypertension   . Diabetes mellitus without complication   . Asthma   . Hyperlipidemia   . Migraine      Past Surgical History Past Surgical History  Procedure Laterality Date  . Tonsillectomy    . Cesarean section    . Finger surgery       Social History: History   Social History  . Marital Status: Single    Spouse Name: N/A    Number of Children: 2  . Years of Education: N/A   Occupational History  . Student    Social History Main Topics  . Smoking status: Never Smoker   . Smokeless tobacco: Not on file  . Alcohol Use: Yes     Comment: Occasional wine.  . Drug Use: No  . Sexual Activity: Yes    Partners: Male    Birth Control/ Protection: Other-see comments     Comment: S/P tubal ligation   Other Topics Concern  . Not on file   Social History Narrative   Single.  Moved here from [place].  Lives with 2 children.      Family History:  Family History  Problem Relation Age of Onset  . Diabetes Mother   . Diabetes Maternal Grandmother   . Cancer Paternal Grandmother   . Hypertension Father   . Hyperlipidemia Father     Allergies: Aspirin; Sulfur; and Tramadol  Meds: Prior  to Admission medications   Medication Sig Start Date End Date Taking? Authorizing Provider  albuterol (PROVENTIL HFA;VENTOLIN HFA) 108 (90 BASE) MCG/ACT inhaler Inhale into the lungs every 6 (six) hours as needed for wheezing or shortness of breath.   Yes Historical Provider, MD  albuterol (PROVENTIL) (2.5 MG/3ML) 0.083% nebulizer solution Take 2.5 mg by nebulization every 6 (six) hours as needed for wheezing or shortness of breath.   Yes Historical Provider, MD  amLODipine (NORVASC) 10 MG tablet Take 1 tablet (10 mg total) by mouth daily. 09/06/13  Yes Tatyana A Kirichenko,  PA-C  atorvastatin (LIPITOR) 40 MG tablet Take 40 mg by mouth daily.   Yes Historical Provider, MD  DM-Doxylamine-Acetaminophen 15-6.25-325 MG/15ML LIQD Take 15 mLs by mouth every 6 (six) hours as needed (for cold symptoms).   Yes Historical Provider, MD  hydrochlorothiazide (HYDRODIURIL) 25 MG tablet Take 25 mg by mouth daily.   Yes Historical Provider, MD  insulin aspart (NOVOLOG FLEXPEN) 100 UNIT/ML SOPN FlexPen Inject 35 Units into the skin 3 (three) times daily with meals.    Yes Historical Provider, MD  spironolactone (ALDACTONE) 25 MG tablet Take 25 mg by mouth daily.   Yes Historical Provider, MD  predniSONE (DELTASONE) 20 MG tablet Take 2 tablets (40 mg total) by mouth daily. 09/06/13   Lottie Mussel, PA-C    Physical Exam: Filed Vitals:   09/15/13 1443 09/15/13 1445 09/15/13 1506 09/15/13 1530  BP:  123/74 118/78 124/76  Pulse:  93  100  Temp:      TempSrc:      Resp:   24 23  Weight:      SpO2: 93% 92% 97% 94%     Physical Exam: Blood pressure 124/76, pulse 100, temperature 97.3 F (36.3 C), temperature source Oral, resp. rate 23, weight 142.747 kg (314 lb 11.2 oz), last menstrual period 09/08/2013, SpO2 94.00%. Gen: Mild distress secondary to paroxysms of cough. Head: Normocephalic, atraumatic. Eyes: PERRL, EOMI, sclerae nonicteric. Mouth: Oropharynx shows white coating to the tongue, postnasal drainage noted. Neck: Supple, no thyromegaly, no lymphadenopathy, no jugular venous distention. Chest: Lungs diminished with a few scattered crackles/rhonchi. CV: Heart sounds are tachycardic but regular. No murmurs, rubs, or gallops. Abdomen: Soft, nontender, nondistended with normal active bowel sounds. Extremities: Extremities are with 2-3+ edema bilaterally. Skin: Warm and dry. Neuro: Alert and oriented times 3; cranial nerves II through XII grossly intact. Psych: Mood and affect normal.  Labs on Admission:  Basic Metabolic Panel:  Recent Labs Lab 09/15/13 1244   NA 131*  K 3.6  CL 99  CO2 21  GLUCOSE 184*  BUN 39*  CREATININE 2.72*  CALCIUM 8.1*   Liver Function Tests:  Recent Labs Lab 09/15/13 1244  AST 20  ALT 13  ALKPHOS 76  BILITOT 0.2*  PROT 6.1  ALBUMIN 1.4*   CBC:  Recent Labs Lab 09/15/13 1244  WBC 4.0  NEUTROABS 2.0  HGB 9.9*  HCT 30.0*  MCV 82.4  PLT 312   Radiological Exams on Admission: Dg Chest 2 View  09/15/2013   CLINICAL DATA:  Cough for 2-1/2 weeks with shortness of breath and fever at times.  EXAM: CHEST  2 VIEW  COMPARISON:  09/16/2013  FINDINGS: Lungs are adequately inflated with bilateral patchy airspace opacification most prominent over the left base with mild overall worsening. Findings are likely due to infection. No evidence of effusion. Mild stable cardiomegaly. Remainder of the exam is unchanged.  IMPRESSION: Mild worsening of a patchy bilateral  airspace process worse over the left base likely due to infection.  Mild stable cardiomegaly.   Electronically Signed   By: Elberta Fortis M.D.   On: 09/15/2013 15:58    EKG: Ordered.  Assessment/Plan Principal Problem:   Community acquired pneumonia The patient has failed outpatient therapy with Levaquin. She has been started on Rocephin/azithromycin, which we will continue for now. No evidence of healthcare associated pneumonia. Given recent bouts of nausea/vomiting, aspiration pneumonia is a possibility. We'll check HIV antibody, strep pneumonia antigen, urinary Legionella antigen, blood cultures, and sputum culture. Tussionex ordered for cough. Active Problems:   Lower extremity edema Check pro BNP and consider two-dimensional echocardiography given evidence of poorly controlled diabetes, hypertension and chronic kidney disease. Check 12-lead EKG to evaluate for hypertensive heart disease/LVH.   Nausea and vomiting Antinausea medication ordered as needed. Hydrate.   Diabetes mellitus Check hemoglobin A1c and start on insulin resistant sliding scale  with 6 units of NovoLog meal coverage.   Normocytic anemia Likely from menstrual losses.   Acute renal failure / dehydration Hold hydrochlorothiazide and spironolactone. Hydrate. Suspect underlying chronic kidney disease complicated by dehydration. Creatinine was 2.07 on 09/06/2013. Albumin levels low, followup urinalysis to evaluate for proteinuria, rule out nephrotic syndrome.   Hyperlipidemia LFTs okay, continue statin.   Hypertension Continue Norvasc but hold hydrochlorothiazide and spironolactone.   Cough Tussionex 5 cc every 12 hours ordered.   Asthma exacerbation Continue nebulized bronchodilator therapy. Has completed a course of prednisone. We'll start on Solu-Medrol 40 mg every 12 hours.  Code Status: Full. Family Communication: No family at bedside. Disposition Plan: Home when stable.   Time spent: 1 hour.  Dustie Brittle [facility] Pager [phone]  If 7PM-7AM, please contact night-coverage [email or web address] Password [facility] 09/15/2013, 5:36 PM

## 2013-09-15 NOTE — ED Notes (Signed)
Per pt sts she was recently treated with PNA and has gotten worse. sts that she is still coughing, and now is having vomiting and diarrhea. sts weak when she stands up.

## 2013-09-15 NOTE — ED Notes (Signed)
IV team unsuccessful at starting IV. 

## 2013-09-15 NOTE — ED Notes (Signed)
Spoke with Admitting MD, pt to be discontinued continouse neb treatment for admission.

## 2013-09-15 NOTE — ED Provider Notes (Signed)
CSN: [id number]     Arrival date & time 09/15/13  1158 History   First MD Initiated Contact with Patient 09/15/13 1504     Chief Complaint  Patient presents with  . Cough  . Vomiting   (Consider location/radiation/quality/duration/timing/severity/associated sxs/prior Treatment) Patient is a 32 y.o. female presenting with cough. The history is provided by the patient and medical records. No language interpreter was used.  Cough Associated symptoms: headaches, shortness of breath and wheezing   Associated symptoms: no chest pain, no diaphoresis, no fever and no rash     Teirra Carapia is a 32 y.o. female  with a hx of asthma, HTN, IDDM, migraine headaches presents to the Emergency Department complaining of gradual, persistent, progressively worsening N/V/D with associated lightheaded feeling and persistent SOB and cough onset yesterday morning.  She reports she finished her levaquin as directed without relief.  She reports home use of MDI and nebulizer with mild improvement in her SOB but no change in her cough.  She reports severe coughing with laying flat.  She reports post-tussive emesis after most of her coughing spells.  This is not the only time patient vomits.  She reports decreased PO intake with most of the vomiting containing mucus, but emesis is nonbloody and nonbilious.  Pt with headache directly associated with coughing.  Pt denies fever, neck pain, neck stiffness, abd pain, dysuria, hematuria, rash, syncope.  Pt with multiple bouts of diarrhea in the AM until treated with Immodium and only 2 bouts of emesis. Patient denies sick contacts.  Pt was seen on 09/02/13 at [facility] for SOB, coughing, chest pain with cough only.  She returned to the ED on 09/06/13 with the same symptoms including persistent SOB. CXR on that day showed bronchitis vs perihilar pneumonitis.  At that time with prednisone and instructed to finish her Levaquin.  Past Medical History  Diagnosis Date  . Hypertension   .  Diabetes mellitus without complication   . Asthma    Past Surgical History  Procedure Laterality Date  . Tonsillectomy    . Cesarean section    . Finger surgery     History reviewed. No pertinent family history. History  Substance Use Topics  . Smoking status: Never Smoker   . Smokeless tobacco: Not on file  . Alcohol Use: No   OB History   Grav Para Term Preterm Abortions TAB SAB Ect Mult Living                 Review of Systems  Constitutional: Positive for appetite change and fatigue. Negative for fever, diaphoresis and unexpected weight change.  HENT: Negative for mouth sores.   Eyes: Negative for visual disturbance.  Respiratory: Positive for cough, chest tightness, shortness of breath and wheezing.   Cardiovascular: Negative for chest pain.  Gastrointestinal: Positive for nausea, vomiting and diarrhea. Negative for abdominal pain and constipation.  Endocrine: Negative for polydipsia, polyphagia and polyuria.  Genitourinary: Negative for dysuria, urgency, frequency and hematuria.  Musculoskeletal: Negative for back pain and neck stiffness.  Skin: Negative for rash.  Allergic/Immunologic: Negative for immunocompromised state.  Neurological: Positive for headaches. Negative for syncope and light-headedness.  Hematological: Does not bruise/bleed easily.  Psychiatric/Behavioral: Negative for sleep disturbance. The patient is not nervous/anxious.     Allergies  Aspirin; Sulfur; and Tramadol  Home Medications   Current Outpatient Rx  Name  Route  Sig  Dispense  Refill  . albuterol (PROVENTIL HFA;VENTOLIN HFA) 108 (90 BASE) MCG/ACT inhaler   Inhalation  Inhale into the lungs every 6 (six) hours as needed for wheezing or shortness of breath.         Marland Kitchen albuterol (PROVENTIL) (2.5 MG/3ML) 0.083% nebulizer solution   Nebulization   Take 2.5 mg by nebulization every 6 (six) hours as needed for wheezing or shortness of breath.         Marland Kitchen amLODipine (NORVASC) 10 MG  tablet   Oral   Take 1 tablet (10 mg total) by mouth daily.   30 tablet   1   . atorvastatin (LIPITOR) 40 MG tablet   Oral   Take 40 mg by mouth daily.         Marland Kitchen DM-Doxylamine-Acetaminophen 15-6.25-325 MG/15ML LIQD   Oral   Take 15 mLs by mouth every 6 (six) hours as needed (for cold symptoms).         . hydrochlorothiazide (HYDRODIURIL) 25 MG tablet   Oral   Take 25 mg by mouth daily.         . insulin aspart (NOVOLOG FLEXPEN) 100 UNIT/ML SOPN FlexPen   Subcutaneous   Inject 35 Units into the skin 3 (three) times daily with meals.          Marland Kitchen spironolactone (ALDACTONE) 25 MG tablet   Oral   Take 25 mg by mouth daily.         . predniSONE (DELTASONE) 20 MG tablet   Oral   Take 2 tablets (40 mg total) by mouth daily.   10 tablet   0    BP 124/76  Pulse 100  Temp(Src) 97.3 F (36.3 C) (Oral)  Resp 23  Wt 314 lb 11.2 oz (142.747 kg)  SpO2 94%  LMP 09/08/2013 Physical Exam  Nursing note and vitals reviewed. Constitutional: She is oriented to person, place, and time. She appears well-developed and well-nourished. No distress.  Awake, alert, nontoxic appearance  HENT:  Head: Normocephalic and atraumatic.  Right Ear: Tympanic membrane, external ear and ear canal normal.  Left Ear: Tympanic membrane, external ear and ear canal normal.  Nose: Nose normal. No mucosal edema or rhinorrhea. No epistaxis. Right sinus exhibits no maxillary sinus tenderness and no frontal sinus tenderness. Left sinus exhibits no maxillary sinus tenderness and no frontal sinus tenderness.  Mouth/Throat: Uvula is midline, oropharynx is clear and moist and mucous membranes are normal. Mucous membranes are not pale and not cyanotic. No uvula swelling. No oropharyngeal exudate, posterior oropharyngeal edema, posterior oropharyngeal erythema or tonsillar abscesses.  Eyes: Conjunctivae are normal. Pupils are equal, round, and reactive to light. No scleral icterus.  Neck: Normal range of motion  and full passive range of motion without pain. Neck supple.  Cardiovascular: Normal rate, regular rhythm, normal heart sounds and intact distal pulses.   No murmur heard. Pulmonary/Chest: No stridor. Tachypnea noted. No respiratory distress. She has decreased breath sounds. She has no wheezes. She has rhonchi.  Increased respiratory effort Rhonchi throughout, no wheezes;  ? Possible rales in the bases  Abdominal: Soft. Bowel sounds are normal. She exhibits no distension and no mass. There is no tenderness. There is no rebound and no guarding.  Musculoskeletal: Normal range of motion. She exhibits no edema.  Lymphadenopathy:    She has no cervical adenopathy.  Neurological: She is alert and oriented to person, place, and time. She exhibits normal muscle tone. Coordination normal.  Speech is clear and goal oriented Moves extremities without ataxia  Skin: Skin is warm and dry. No rash noted. She is not diaphoretic. No  erythema.  Psychiatric: She has a normal mood and affect. Her behavior is normal.    ED Course  Procedures (including critical care time) Labs Review Labs Reviewed  CBC WITH DIFFERENTIAL - Abnormal; Notable for the following:    RBC 3.64 (*)    Hemoglobin 9.9 (*)    HCT 30.0 (*)    All other components within normal limits  COMPREHENSIVE METABOLIC PANEL - Abnormal; Notable for the following:    Sodium 131 (*)    Glucose, Bld 184 (*)    BUN 39 (*)    Creatinine, Ser 2.72 (*)    Calcium 8.1 (*)    Albumin 1.4 (*)    Total Bilirubin 0.2 (*)    GFR calc non Af Amer 22 (*)    GFR calc Af Amer 25 (*)    All other components within normal limits  URINALYSIS, ROUTINE W REFLEX MICROSCOPIC   Imaging Review Dg Chest 2 View  09/15/2013   CLINICAL DATA:  Cough for 2-1/2 weeks with shortness of breath and fever at times.  EXAM: CHEST  2 VIEW  COMPARISON:  09/16/2013  FINDINGS: Lungs are adequately inflated with bilateral patchy airspace opacification most prominent over the  left base with mild overall worsening. Findings are likely due to infection. No evidence of effusion. Mild stable cardiomegaly. Remainder of the exam is unchanged.  IMPRESSION: Mild worsening of a patchy bilateral airspace process worse over the left base likely due to infection.  Mild stable cardiomegaly.   Electronically Signed   By: Elberta Fortis M.D.   On: 09/15/2013 15:58    EKG Interpretation   None       MDM   1. CAP (community acquired pneumonia)   2. SOB (shortness of breath)   3. Cough   4. Hypoxia   5. Acute on chronic renal insufficiency      Olesya Wike reports with Hx of asthma and possibly CAP with persistent SOB, N/V/D and generalized weakness.  She reports low function of her kidneys from poor control of her diabetes; but today's lab values are worse than when she was evaluated 9 days ago.  She does not know her baseline creatinine.    4:42 PM Chest x-ray with worsening left lower consolidation. Patient oxygen saturation 92% on room air but when ambulating patient becomes lightheaded and oxygen saturations drop to 87%.  Pt currently on 2L via [place]. Patient also with elevated BUN and creatinine above baseline. Patient reports initial elevation secondary to uncontrolled hyperglycemia.  Anion gap 11 glucose 184.  We'll give fluid bolus and initiate Rocephin and azithromycin as patient has failed outpatient Levaquin. We'll proceed with admission.  PCP: none  5:19 PM Discussed with hospitalist who admit.    Dahlia Client Stachia Slutsky, PA-C 09/15/13 1719

## 2013-09-15 NOTE — ED Notes (Signed)
IV team at bedside 

## 2013-09-15 NOTE — ED Notes (Signed)
Patient states she is unable to void at this time.  

## 2013-09-16 DIAGNOSIS — I428 Other cardiomyopathies: Secondary | ICD-10-CM | POA: Diagnosis present

## 2013-09-16 DIAGNOSIS — E860 Dehydration: Secondary | ICD-10-CM

## 2013-09-16 DIAGNOSIS — I319 Disease of pericardium, unspecified: Secondary | ICD-10-CM

## 2013-09-16 DIAGNOSIS — E119 Type 2 diabetes mellitus without complications: Secondary | ICD-10-CM

## 2013-09-16 DIAGNOSIS — J9600 Acute respiratory failure, unspecified whether with hypoxia or hypercapnia: Secondary | ICD-10-CM

## 2013-09-16 DIAGNOSIS — E785 Hyperlipidemia, unspecified: Secondary | ICD-10-CM

## 2013-09-16 DIAGNOSIS — R112 Nausea with vomiting, unspecified: Secondary | ICD-10-CM

## 2013-09-16 DIAGNOSIS — R0602 Shortness of breath: Secondary | ICD-10-CM

## 2013-09-16 DIAGNOSIS — I10 Essential (primary) hypertension: Secondary | ICD-10-CM

## 2013-09-16 LAB — GLUCOSE, CAPILLARY
Glucose-Capillary: 382 mg/dL — ABNORMAL HIGH (ref 70–99)
Glucose-Capillary: 341 mg/dL — ABNORMAL HIGH (ref 70–99)
Glucose-Capillary: 315 mg/dL — ABNORMAL HIGH (ref 70–99)
Glucose-Capillary: 217 mg/dL — ABNORMAL HIGH (ref 70–99)

## 2013-09-16 LAB — BASIC METABOLIC PANEL
BUN: 37 mg/dL — ABNORMAL HIGH (ref 6–23)
CO2: 17 mEq/L — ABNORMAL LOW (ref 19–32)
Calcium: 8.1 mg/dL — ABNORMAL LOW (ref 8.4–10.5)
Creatinine, Ser: 2.59 mg/dL — ABNORMAL HIGH (ref 0.50–1.10)
GFR calc Af Amer: 27 mL/min — ABNORMAL LOW (ref >90)
GFR calc non Af Amer: 23 mL/min — ABNORMAL LOW (ref >90)
Glucose, Bld: 392 mg/dL — ABNORMAL HIGH (ref 70–99)
Potassium: 4.2 mEq/L (ref 3.5–5.1)
Sodium: 127 mEq/L — ABNORMAL LOW (ref 135–145)

## 2013-09-16 LAB — HIV ANTIBODY (ROUTINE TESTING W REFLEX): HIV: NONREACTIVE

## 2013-09-16 LAB — INFLUENZA PANEL BY PCR (TYPE A & B)
H1N1 flu by pcr: NOT DETECTED
Influenza A By PCR: NEGATIVE

## 2013-09-16 LAB — LEGIONELLA ANTIGEN, URINE

## 2013-09-16 LAB — HEMOGLOBIN A1C
Hgb A1c MFr Bld: 10 % — ABNORMAL HIGH (ref <5.7)
Mean Plasma Glucose: 240 mg/dL — ABNORMAL HIGH (ref <117)

## 2013-09-16 LAB — EXPECTORATED SPUTUM ASSESSMENT W GRAM STAIN, RFLX TO RESP C

## 2013-09-16 MED ORDER — METOPROLOL TARTRATE 12.5 MG HALF TABLET
12.5000 mg | ORAL_TABLET | Freq: Two times a day (BID) | ORAL | Status: DC
  Administered 2013-09-16 – 2013-09-19 (×7): 12.5 mg via ORAL
  Filled 2013-09-16 (×10): qty 1

## 2013-09-16 MED ORDER — ALBUTEROL SULFATE (5 MG/ML) 0.5% IN NEBU
2.5000 mg | INHALATION_SOLUTION | Freq: Four times a day (QID) | RESPIRATORY_TRACT | Status: DC
  Administered 2013-09-16 (×2): 2.5 mg via RESPIRATORY_TRACT
  Filled 2013-09-16 (×3): qty 0.5

## 2013-09-16 MED ORDER — ALBUTEROL SULFATE (5 MG/ML) 0.5% IN NEBU: 2.5000 mg | INHALATION_SOLUTION | RESPIRATORY_TRACT | Status: DC | PRN

## 2013-09-16 MED ORDER — IPRATROPIUM BROMIDE 0.02 % IN SOLN
0.5000 mg | Freq: Four times a day (QID) | RESPIRATORY_TRACT | Status: DC
  Administered 2013-09-16 (×2): 0.5 mg via RESPIRATORY_TRACT
  Filled 2013-09-16 (×3): qty 2.5

## 2013-09-16 MED ORDER — INSULIN GLARGINE 100 UNIT/ML ~~LOC~~ SOLN
15.0000 [IU] | Freq: Every day | SUBCUTANEOUS | Status: DC
  Administered 2013-09-16: 15 [IU] via SUBCUTANEOUS
  Filled 2013-09-16 (×2): qty 0.15

## 2013-09-16 MED ORDER — AZITHROMYCIN 500 MG PO TABS
500.0000 mg | ORAL_TABLET | Freq: Every day | ORAL | Status: AC
  Administered 2013-09-16 – 2013-09-19 (×4): 500 mg via ORAL
  Filled 2013-09-16 (×4): qty 1

## 2013-09-16 MED ORDER — INSULIN ASPART 100 UNIT/ML ~~LOC~~ SOLN
10.0000 [IU] | Freq: Three times a day (TID) | SUBCUTANEOUS | Status: DC
  Administered 2013-09-16 – 2013-09-21 (×10): 10 [IU] via SUBCUTANEOUS
  Administered 2013-09-22: 13:00:00 5 [IU] via SUBCUTANEOUS
  Administered 2013-09-22: 10 [IU] via SUBCUTANEOUS

## 2013-09-16 MED ORDER — BUDESONIDE 0.25 MG/2ML IN SUSP
0.2500 mg | Freq: Two times a day (BID) | RESPIRATORY_TRACT | Status: DC
Start: 2013-09-16 — End: 2013-09-22
  Administered 2013-09-16 – 2013-09-21 (×11): 0.25 mg via RESPIRATORY_TRACT
  Filled 2013-09-16 (×16): qty 2

## 2013-09-16 NOTE — Progress Notes (Addendum)
Unable to complete admission hall pass as pt was admitted to unit during shift change. Pt alert and oriented. Skin is intact. Pt oriented to room, call bell placed within reach. Pt understands how to use call bell and to call when assistance is needed. Will continue to monitor pt per MD orders.

## 2013-09-16 NOTE — Progress Notes (Signed)
Nutrition Brief Note  Patient identified on the Malnutrition Screening Tool (MST) Report. Pt reports 5 lb wt loss in the "past few days." Pt is currently Obese Class III and eating 100% of her meals.  Wt Readings from Last 15 Encounters:  09/15/13 316 lb 2.2 oz (143.4 kg)    Body mass index is 48.08 kg/(m^2). Patient meets criteria for Obese Class III based on current BMI.   Current diet order is Carbohydrate Modified Medium, patient is consuming approximately 100% of meals at this time. Labs and medications reviewed.   No nutrition interventions warranted at this time. If nutrition issues arise, please consult RD.   Jarold Motto MS, RD, LDN Pager: [phone] After-hours pager: [phone]

## 2013-09-16 NOTE — Progress Notes (Signed)
Pt on continuous pulse ox. Oxygen sats have been ranging between 87-94% on 2L of oxygen. Pt is asymptomatic, no complaints of wheezing or shortness of breath. Spoke with NP on call. Will increase to 3L of oxygen and continue to monitor pt.

## 2013-09-16 NOTE — Progress Notes (Signed)
PATIENT DETAILS Name: [name] Age: 32 y.o. Sex: female Date of Birth: 1981/08/15 Admit Date: 09/15/2013 Admitting Physician Maryruth Bun Rama, MD PCP:No PCP Per Patient  Subjective: Feels better than yesterday. Still short of breath and wheezing.  Assessment/Plan: Principal Problem:   Community acquired pneumonia - Afebrile, no leukocytosis. Somewhat improved clinically. - Continue with Rocephin and Zithromax-day 2. - Blood cultures 12/18- pending - Influenza PCR negative - HIV negative - Urine strep pneumo antigen negative, Legionella antigen pending.  Active Problems: Acute hypoxic respiratory failure - Secondary to above and from acute asthmatic bronchitis/exacerbation - Continue oxygen via nasal cannula, continue scheduled nebulized bronchodilators and intravenous steroids. Continue IV Levaquin. - Taper off oxygen as tolerated  Asthma with exacerbation - Continue nebulized bronchodilators, intravenous steroids, antibiotics as above. - Add incentive spirometry and flutter valve.  Lower extremity edema - Has proteinuria with hypoalbuminemia, could be secondary to nephrotic syndrome from diabetes. LFTs within normal limits with the exception of albumin. - However BNP also elevated, we'll check 2-D echocardiogram. Suspect shortness of breath is probably from pneumonia and asthma exacerbation rather than CHF decompensation.  Acute renal failure - Mild improvement in creatinine. - Urinalysis does show proteinuria- however she has a history of diabetes. Could have some underlying chronic kidney disease. - Continue to gently hydrate, suspect renal failure to be prerenal from pneumonia, diuretics also contributing. - If no improvement in creatinine, well do a renal ultrasound.  Diabetes - CBGs reviewed, sugars uncontrolled likely secondary to steroids. Hemoglobin A1c 10.0 - Will start Lantus 15 units each bedtime, increased premium NovoLog 10 units 3 times a day.  Continue with SSI. We'll continue to just Lantus/NovoLog depending on CBG readings.  Hypertension - Continue with amlodipine. - We'll continue to monitor blood pressure trend and adjust accordingly.  Nausea with vomiting - Results, continue with supportive care.  Dyslipidemia - Continue with statins  Disposition: Remain inpatient  DVT Prophylaxis: Prophylactic Lovenox   Code Status: Full code or  Family Communication None at bedside  Procedures:  None  CONSULTS:  None  Time spent 40 minutes-which includes 50% of the time with face-to-face with patient .  MEDICATIONS: Scheduled Meds: . ipratropium  0.5 mg Nebulization Q6H   And  . albuterol  2.5 mg Nebulization Q6H  . albuterol  5 mg Nebulization Once  . amLODipine  10 mg Oral Daily  . atorvastatin  40 mg Oral Daily  . azithromycin  500 mg Oral Daily  . budesonide (PULMICORT) nebulizer solution  0.25 mg Nebulization BID  . cefTRIAXone (ROCEPHIN)  IV  1 g Intravenous Q24H  . chlorpheniramine-HYDROcodone  5 mL Oral Q12H  . enoxaparin (LOVENOX) injection  40 mg Subcutaneous Q24H  . insulin aspart  0-20 Units Subcutaneous TID WC  . insulin aspart  0-5 Units Subcutaneous QHS  . insulin aspart  6 Units Subcutaneous TID WC  . methylPREDNISolone (SOLU-MEDROL) injection  40 mg Intravenous Q12H   Continuous Infusions: . sodium chloride     PRN Meds:.albuterol, ondansetron (ZOFRAN) IV  Antibiotics: Anti-infectives   Start     Dose/Rate Route Frequency Ordered Stop   09/16/13 1000  azithromycin (ZITHROMAX) tablet 500 mg     500 mg Oral Daily 09/16/13 0946 09/23/13 0959   09/16/13 0600  cefTRIAXone (ROCEPHIN) 1 g in dextrose 5 % 50 mL IVPB     1 g 100 mL/hr over 30 Minutes Intravenous Every 24 hours 09/15/13 2000 09/23/13 0559   09/15/13 1815  cefTRIAXone (ROCEPHIN) 1 g in  dextrose 5 % 50 mL IVPB  Status:  Discontinued     1 g 100 mL/hr over 30 Minutes Intravenous Every 24 hours 09/15/13 1813 09/15/13 1959    09/15/13 1815  azithromycin (ZITHROMAX) 500 mg in dextrose 5 % 250 mL IVPB  Status:  Discontinued     500 mg 250 mL/hr over 60 Minutes Intravenous Every 24 hours 09/15/13 1813 09/16/13 0945   09/15/13 1645  cefTRIAXone (ROCEPHIN) 1 g in dextrose 5 % 50 mL IVPB     1 g 100 mL/hr over 30 Minutes Intravenous  Once 09/15/13 1642 09/15/13 1844   09/15/13 1645  azithromycin (ZITHROMAX) 500 mg in dextrose 5 % 250 mL IVPB  Status:  Discontinued     500 mg 250 mL/hr over 60 Minutes Intravenous  Once 09/15/13 1642 09/16/13 1020       PHYSICAL EXAM: Vital signs in last 24 hours: Filed Vitals:   09/16/13 0212 09/16/13 0611 09/16/13 0911 09/16/13 1048  BP:  138/90  155/91  Pulse:  105    Temp:  98.3 F (36.8 C)    TempSrc:  Oral    Resp:  18    Height:      Weight:      SpO2: 90% 90% 93%     Weight change:  Filed Weights   09/15/13 1210 09/15/13 2003  Weight: 142.747 kg (314 lb 11.2 oz) 143.4 kg (316 lb 2.2 oz)   Body mass index is 48.08 kg/(m^2).   Gen Exam: Awake and alert with clear speech.  Not in acute distress. Neck: Supple, No JVD.  Chest: Good air entry bilaterally coarse rhonchi heard in all lung zones. CVS: S1 S2 Regular, no murmurs.  Abdomen: soft, BS +, non tender, non distended.  Extremities: 1+ edema, lower extremities warm to touch. Neurologic: Non Focal.   Skin: No Rash.   Wounds: N/A.   Intake/Output from previous day:  Intake/Output Summary (Last 24 hours) at 09/16/13 1111 Last data filed at 09/16/13 0908  Gross per 24 hour  Intake    240 ml  Output   1200 ml  Net   -960 ml     LAB RESULTS: CBC  Recent Labs Lab 09/15/13 1244 09/15/13 2020  WBC 4.0 7.7  HGB 9.9* 9.0*  HCT 30.0* 26.8*  PLT 312 263  MCV 82.4 82.0  MCH 27.2 27.5  MCHC 33.0 33.6  RDW 13.6 13.6  LYMPHSABS 1.8  --   MONOABS 0.3  --   EOSABS 0.0  --   BASOSABS 0.0  --     Chemistries   Recent Labs Lab 09/15/13 1244 09/15/13 2020 09/16/13 0608  NA 131*  --  127*  K 3.6   --  4.2  CL 99  --  96  CO2 21  --  17*  GLUCOSE 184*  --  392*  BUN 39*  --  37*  CREATININE 2.72* 2.66* 2.59*  CALCIUM 8.1*  --  8.1*    CBG:  Recent Labs Lab 09/15/13 2139 09/16/13 0803  GLUCAP 179* 382*    GFR Estimated Creatinine Clearance: 46.2 ml/min (by C-G formula based on Cr of 2.59).  Coagulation profile No results found for this basename: INR, PROTIME,  in the last 168 hours  Cardiac Enzymes No results found for this basename: CK, CKMB, TROPONINI, MYOGLOBIN,  in the last 168 hours  No components found with this basename: POCBNP,  No results found for this basename: DDIMER,  in the last 72 hours  Recent Labs  09/15/13 1244  HGBA1C 10.0*   No results found for this basename: CHOL, HDL, LDLCALC, TRIG, CHOLHDL, LDLDIRECT,  in the last 72 hours No results found for this basename: TSH, T4TOTAL, FREET3, T3FREE, THYROIDAB,  in the last 72 hours No results found for this basename: VITAMINB12, FOLATE, FERRITIN, TIBC, IRON, RETICCTPCT,  in the last 72 hours No results found for this basename: LIPASE, AMYLASE,  in the last 72 hours  Urine Studies No results found for this basename: UACOL, UAPR, USPG, UPH, UTP, UGL, UKET, UBIL, UHGB, UNIT, UROB, ULEU, UEPI, UWBC, URBC, UBAC, CAST, CRYS, UCOM, BILUA,  in the last 72 hours  MICROBIOLOGY: Recent Results (from the past 240 hour(s))  CULTURE, EXPECTORATED SPUTUM-ASSESSMENT     Status: None   Collection Time    09/15/13 11:06 PM      Result Value Range Status   Specimen Description SPUTUM   Final   Special Requests NONE   Final   Sputum evaluation     Final   Value: MICROSCOPIC FINDINGS SUGGEST THAT THIS SPECIMEN IS NOT REPRESENTATIVE OF LOWER RESPIRATORY SECRETIONS. PLEASE RECOLLECT.     CALLED TO A.THOMPSON,RN 0020 09/16/13 M.CAMPBELL   Report Status 09/16/2013 FINAL   Final    RADIOLOGY STUDIES/RESULTS: Dg Chest 2 View  09/15/2013   CLINICAL DATA:  Cough for 2-1/2 weeks with shortness of breath and fever at  times.  EXAM: CHEST  2 VIEW  COMPARISON:  09/16/2013  FINDINGS: Lungs are adequately inflated with bilateral patchy airspace opacification most prominent over the left base with mild overall worsening. Findings are likely due to infection. No evidence of effusion. Mild stable cardiomegaly. Remainder of the exam is unchanged.  IMPRESSION: Mild worsening of a patchy bilateral airspace process worse over the left base likely due to infection.  Mild stable cardiomegaly.   Electronically Signed   By: Elberta Fortis M.D.   On: 09/15/2013 15:58   Dg Chest 2 View  09/06/2013   CLINICAL DATA:  Cough, fever.  EXAM: CHEST  2 VIEW  COMPARISON:  None.  FINDINGS: The heart size and mediastinal contours are within normal limits. No pneumothorax or pleural effusion is noted. Bilateral peribronchial thickening is noted concerning for bronchitis or possibly perihilar pneumonitis. The visualized skeletal structures are unremarkable.  IMPRESSION: Findings consistent with either bilateral bronchitis or perihilar pneumonitis.   Electronically Signed   By: Roque Lias M.D.   On: 09/06/2013 11:07    Jeoffrey Massed, MD  [facility] Pager:[phone]  If 7PM-7AM, please contact night-coverage [email or web address] Password [email or web address] 09/16/2013, 11:11 AM   LOS: 1 day

## 2013-09-16 NOTE — Consult Note (Signed)
Reason for Consult: Abnormal echo  Requesting Physician: [facility]  HPI: This is a 32 y.o. female with a past medical history significant for HTN, DM, asthmatic COPD, and morbid obesity. She just moved here from [place]. She actually was followed by a cardiologist in [place] for HTN. She has never had a cath but she has had prior echos. She moved here in Sept. She says she has been taking her medications. She was admitted 09/15/13 feeling poorly. She had SOB, edema in her legs, fatigue, and nausea and vomiting. Since admission she has been found to have a BNP of 7879, a SCr of 2.7, and possible LLL pneumonia on CXR. Echo showed her EF to be 35-40%.   PMHx:  Past Medical History  Diagnosis Date  . Hypertension   . Asthma   . Hyperlipidemia   . Pneumonia     "couple times; have it now" (09/15/2013)  . Chronic bronchitis     "just about q yr" (09/15/2013)  . Shortness of breath     "just recently; related to the pneumonia" (09/15/2013)  . Type II diabetes mellitus   . Anemia   . Migraine     "get them alot" (09/15/2013)  . Arthritis     "left hand" (09/15/2013)  . Chronic kidney disease     "low kidney function" (09/15/2013)   Past Surgical History  Procedure Laterality Date  . Cesarean section  1999; 2006  . 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 Tonsillectomy  1997  . Tubal ligation  2006    FAMHx: Her father had some type of heart proceedure  SOCHx:  reports that she has never smoked. She has never used smokeless tobacco. She reports that she drinks alcohol. She reports that she does not use illicit drugs.Not married, two children, currently not working, "in school".  ALLERGIES: Allergies  Allergen Reactions  . Aspirin Anaphylaxis  . Sulfur Hives  . Tramadol Other (See Comments)    Pt states she feels weird     ROS: Pertinent items are noted in HPI. see adm H&P for complete details  HOME MEDICATIONS: Prescriptions  prior to admission  Medication Sig Dispense Refill  . albuterol (PROVENTIL HFA;VENTOLIN HFA) 108 (90 BASE) MCG/ACT inhaler Inhale into the lungs every 6 (six) hours as needed for wheezing or shortness of breath.      Marland Kitchen albuterol (PROVENTIL) (2.5 MG/3ML) 0.083% nebulizer solution Take 2.5 mg by nebulization every 6 (six) hours as needed for wheezing or shortness of breath.      Marland Kitchen amLODipine (NORVASC) 10 MG tablet Take 1 tablet (10 mg total) by mouth daily.  30 tablet  1  . atorvastatin (LIPITOR) 40 MG tablet Take 40 mg by mouth daily.      Marland Kitchen DM-Doxylamine-Acetaminophen 15-6.25-325 MG/15ML LIQD Take 15 mLs by mouth every 6 (six) hours as needed (for cold symptoms).      . hydrochlorothiazide (HYDRODIURIL) 25 MG tablet Take 25 mg by mouth daily.      . insulin aspart (NOVOLOG FLEXPEN) 100 UNIT/ML SOPN FlexPen Inject 35 Units into the skin 3 (three) times daily with meals.       Marland Kitchen spironolactone (ALDACTONE) 25 MG tablet Take 25 mg by mouth daily.      . predniSONE (DELTASONE) 20 MG tablet Take 2 tablets (40 mg total) by mouth daily.  10 tablet  0    HOSPITAL MEDICATIONS: I have reviewed the patient's current medications.  VITALS: Blood pressure 134/75, pulse 109, temperature 98 F (36.7 C), temperature source Oral, resp. rate 18, height 5\' 8"  (1.727 m), weight 316 lb 2.2 oz (143.4 kg), last menstrual period 09/08/2013, SpO2 93.00%.  PHYSICAL EXAM: General appearance: alert, cooperative, no distress and morbidly obese Neck: no carotid bruit and no JVD Lungs: decreased breath sounds some scattered wheezing Heart: regular rate and rhythm Abdomen: obese Extremities: 1+ edema Pulses: 2+ and symmetric Skin: pale, cool, dry Neurologic: Grossly normal  LABS: Results for orders placed during the hospital encounter of 09/15/13 (from the past 48 hour(s))  CBC WITH DIFFERENTIAL     Status: Abnormal   Collection Time    09/15/13 12:44 PM      Result Value Range   WBC 4.0  4.0 - 10.5 K/uL   RBC  3.64 (*) 3.87 - 5.11 MIL/uL   Hemoglobin 9.9 (*) 12.0 - 15.0 g/dL   HCT 46.9 (*) 62.9 - 52.8 %   MCV 82.4  78.0 - 100.0 fL   MCH 27.2  26.0 - 34.0 pg   MCHC 33.0  30.0 - 36.0 g/dL   RDW 41.3  24.4 - 01.0 %   Platelets 312  150 - 400 K/uL   Neutrophils Relative % 49  43 - 77 %   Neutro Abs 2.0  1.7 - 7.7 K/uL   Lymphocytes Relative 44  12 - 46 %   Lymphs Abs 1.8  0.7 - 4.0 K/uL   Monocytes Relative 7  3 - 12 %   Monocytes Absolute 0.3  0.1 - 1.0 K/uL   Eosinophils Relative 0  0 - 5 %   Eosinophils Absolute 0.0  0.0 - 0.7 K/uL   Basophils Relative 1  0 - 1 %   Basophils Absolute 0.0  0.0 - 0.1 K/uL  COMPREHENSIVE METABOLIC PANEL     Status: Abnormal   Collection Time    09/15/13 12:44 PM      Result Value Range   Sodium 131 (*) 135 - 145 mEq/L   Potassium 3.6  3.5 - 5.1 mEq/L   Chloride 99  96 - 112 mEq/L   CO2 21  19 - 32 mEq/L   Glucose, Bld 184 (*) 70 - 99 mg/dL   BUN 39 (*) 6 - 23 mg/dL   Creatinine, Ser 2.72 (*) 0.50 - 1.10 mg/dL   Calcium 8.1 (*) 8.4 - 10.5 mg/dL   Total Protein 6.1  6.0 - 8.3 g/dL   Albumin 1.4 (*) 3.5 - 5.2 g/dL   AST 20  0 - 37 U/L   ALT 13  0 - 35 U/L   Alkaline Phosphatase 76  39 - 117 U/L   Total Bilirubin 0.2 (*) 0.3 - 1.2 mg/dL   GFR calc non Af Amer 22 (*) >90 mL/min   GFR calc Af Amer 25 (*) >90 mL/min   Comment: (NOTE)     The eGFR has been calculated using the CKD EPI equation.     This calculation has not been validated in all clinical situations.     eGFR's persistently <90 mL/min signify possible Chronic Kidney     Disease.  HEMOGLOBIN A1C     Status: Abnormal   Collection Time    09/15/13 12:44 PM      Result Value Range   Hemoglobin A1C 10.0 (*) <5.7 %   Comment: (NOTE)  According to the ADA Clinical Practice Recommendations for 2011, when     HbA1c is used as a screening test:      >=6.5%   Diagnostic of Diabetes Mellitus               (if abnormal  result is confirmed)     5.7-6.4%   Increased risk of developing Diabetes Mellitus     References:Diagnosis and Classification of Diabetes Mellitus,Diabetes     Care,2011,34(Suppl 1):S62-S69 and Standards of Medical Care in             Diabetes - 2011,Diabetes Care,2011,34 (Suppl 1):S11-S61.   Mean Plasma Glucose 240 (*) <117 mg/dL   Comment: Performed at [facility]  HIV ANTIBODY (ROUTINE TESTING)     Status: None   Collection Time    09/15/13  8:20 PM      Result Value Range   HIV NON REACTIVE  NON REACTIVE   Comment: Performed at [facility]  CBC     Status: Abnormal   Collection Time    09/15/13  8:20 PM      Result Value Range   WBC 7.7  4.0 - 10.5 K/uL   RBC 3.27 (*) 3.87 - 5.11 MIL/uL   Hemoglobin 9.0 (*) 12.0 - 15.0 g/dL   HCT 09.8 (*) 11.9 - 14.7 %   MCV 82.0  78.0 - 100.0 fL   MCH 27.5  26.0 - 34.0 pg   MCHC 33.6  30.0 - 36.0 g/dL   RDW 82.9  56.2 - 13.0 %   Platelets 263  150 - 400 K/uL  CREATININE, SERUM     Status: Abnormal   Collection Time    09/15/13  8:20 PM      Result Value Range   Creatinine, Ser 2.66 (*) 0.50 - 1.10 mg/dL   GFR calc non Af Amer 22 (*) >90 mL/min   GFR calc Af Amer 26 (*) >90 mL/min   Comment: (NOTE)     The eGFR has been calculated using the CKD EPI equation.     This calculation has not been validated in all clinical situations.     eGFR's persistently <90 mL/min signify possible Chronic Kidney     Disease.  PRO B NATRIURETIC PEPTIDE     Status: Abnormal   Collection Time    09/15/13  8:20 PM      Result Value Range   Pro B Natriuretic peptide (BNP) 7879.0 (*) 0 - 125 pg/mL  GLUCOSE, CAPILLARY     Status: Abnormal   Collection Time    09/15/13  9:39 PM      Result Value Range   Glucose-Capillary 179 (*) 70 - 99 mg/dL   Comment 1 Documented in Chart     Comment 2 Notify RN    URINALYSIS, ROUTINE W REFLEX MICROSCOPIC     Status: Abnormal   Collection Time    09/15/13 11:06 PM      Result Value Range   Color,  Urine YELLOW  YELLOW   APPearance CLEAR  CLEAR   Specific Gravity, Urine 1.015  1.005 - 1.030   pH 6.0  5.0 - 8.0   Glucose, UA 250 (*) NEGATIVE mg/dL   Hgb urine dipstick MODERATE (*) NEGATIVE   Bilirubin Urine NEGATIVE  NEGATIVE   Ketones, ur NEGATIVE  NEGATIVE mg/dL   Protein, ur >865 (*) NEGATIVE mg/dL   Urobilinogen, UA 0.2  0.0 - 1.0 mg/dL   Nitrite NEGATIVE  NEGATIVE  Leukocytes, UA NEGATIVE  NEGATIVE  INFLUENZA PANEL BY PCR     Status: None   Collection Time    09/15/13 11:06 PM      Result Value Range   Influenza A By PCR NEGATIVE  NEGATIVE   Influenza B By PCR NEGATIVE  NEGATIVE   H1N1 flu by pcr NOT DETECTED  NOT DETECTED   Comment:            The Xpert Flu assay (FDA approved for     nasal aspirates or washes and     nasopharyngeal swab specimens), is     intended as an aid in the diagnosis of     influenza and should not be used as     a sole basis for treatment.  LEGIONELLA ANTIGEN, URINE     Status: None   Collection Time    09/15/13 11:06 PM      Result Value Range   Specimen Description URINE, RANDOM     Special Requests NONE     Legionella Antigen, Urine       Value: Negative for Legionella pneumophilia serogroup 1     Performed at [facility]   Report Status 09/16/2013 FINAL    STREP PNEUMONIAE URINARY ANTIGEN     Status: None   Collection Time    09/15/13 11:06 PM      Result Value Range   Strep Pneumo Urinary Antigen NEGATIVE  NEGATIVE   Comment:            Infection due to S. pneumoniae     cannot be absolutely ruled out     since the antigen present     may be below the detection limit     of the test.  URINE MICROSCOPIC-ADD ON     Status: None   Collection Time    09/15/13 11:06 PM      Result Value Range   Squamous Epithelial / LPF RARE  RARE   WBC, UA 0-2  <3 WBC/hpf   RBC / HPF 3-6  <3 RBC/hpf   Bacteria, UA RARE  RARE  CULTURE, EXPECTORATED SPUTUM-ASSESSMENT     Status: None   Collection Time    09/15/13 11:06 PM       Result Value Range   Specimen Description SPUTUM     Special Requests NONE     Sputum evaluation       Value: MICROSCOPIC FINDINGS SUGGEST THAT THIS SPECIMEN IS NOT REPRESENTATIVE OF LOWER RESPIRATORY SECRETIONS. PLEASE RECOLLECT.     CALLED TO A.THOMPSON,RN 0020 09/16/13 M.CAMPBELL   Report Status 09/16/2013 FINAL    BASIC METABOLIC PANEL     Status: Abnormal   Collection Time    09/16/13  6:08 AM      Result Value Range   Sodium 127 (*) 135 - 145 mEq/L   Potassium 4.2  3.5 - 5.1 mEq/L   Chloride 96  96 - 112 mEq/L   CO2 17 (*) 19 - 32 mEq/L   Glucose, Bld 392 (*) 70 - 99 mg/dL   BUN 37 (*) 6 - 23 mg/dL   Creatinine, Ser 0.10 (*) 0.50 - 1.10 mg/dL   Calcium 8.1 (*) 8.4 - 10.5 mg/dL   GFR calc non Af Amer 23 (*) >90 mL/min   GFR calc Af Amer 27 (*) >90 mL/min   Comment: (NOTE)     The eGFR has been calculated using the CKD EPI equation.     This calculation has not  been validated in all clinical situations.     eGFR's persistently <90 mL/min signify possible Chronic Kidney     Disease.  GLUCOSE, CAPILLARY     Status: Abnormal   Collection Time    09/16/13  8:03 AM      Result Value Range   Glucose-Capillary 382 (*) 70 - 99 mg/dL  GLUCOSE, CAPILLARY     Status: Abnormal   Collection Time    09/16/13 12:30 PM      Result Value Range   Glucose-Capillary 341 (*) 70 - 99 mg/dL  GLUCOSE, CAPILLARY     Status: Abnormal   Collection Time    09/16/13  4:56 PM      Result Value Range   Glucose-Capillary 315 (*) 70 - 99 mg/dL    EKG:   IMAGING: Dg Chest 2 View  09/15/2013   CLINICAL DATA:  Cough for 2-1/2 weeks with shortness of breath and fever at times.  EXAM: CHEST  2 VIEW  COMPARISON:  09/16/2013  FINDINGS: Lungs are adequately inflated with bilateral patchy airspace opacification most prominent over the left base with mild overall worsening. Findings are likely due to infection. No evidence of effusion. Mild stable cardiomegaly. Remainder of the exam is unchanged.   IMPRESSION: Mild worsening of a patchy bilateral airspace process worse over the left base likely due to infection.  Mild stable cardiomegaly.   Electronically Signed   By: Elberta Fortis M.D.   On: 09/15/2013 15:58    IMPRESSION: Principal Problem:   Community acquired pneumonia Active Problems:   Asthma exacerbation   Cardiomyopathy- etiol undetermined- EF 35-40%   Diabetes mellitus- uncontrolled   Acute renal insuficency   Dehydration   Hypertension   Nausea and vomiting   Normocytic anemia   Hyperlipidemia   Morbid obesity- BMI 48   RECOMMENDATION: Cautious hydration. MD to see, consider low dose beta blocker, ischemic work up when she is stable- OP Myoview.  Time Spent Directly with Patient: 45 minutes  [name] [phone] beeper 09/16/2013, 6:34 PM   I have seen and evaluated the patient this PM along with Corine Shelter, PA. I agree with his findings, examination as well as impression recommendations.  Difficult scenario with a young woman who has severe DM, previously poorly controlled HTN & morbid obesity p/w A on C RF & likely PNA.  Noted to be hypoalbuminemic with edema -- ?nephrotic.   ProBNP was elevated, despite not JVP to orthopnea.  Denies ever having CP ro signficant palpitations. Echo ordered to f/u BNP & edema revealed EF ~30-35% with what amounts to be global HK (echo personally reviewed along with Dr. Royann Shivers -- regional WMA not truly noted).  It would appear that she is indeed intravascularly depleted / dehydrated -- she reports poor overall PO intake over several days.   She does have diffuse mild rales / rhonchi on exam, with ~1+ pitting edema.  I did not see JVD.    It would seem that the finding of reduced EF may simply be coincidental, as she does not really seem to be in decompensated HF.    She is tachycardic - will add low dose BB.  No ACE-I  Or Spironolactone due to CKD. Wll probably need some type of diuretic on d/c.  No plan for invasive  work-up @ this time due to worsened renal function & lack of ischemic Sx.    Echo findings are most c/w "burned-out" HTN.  Will continue to follow & arrange OP f/u.  Marykay Lex, M.D., M.S. [facility] [address] [place], [place]  [address]  [phone] Pager # [phone] 09/16/2013 8:16 PM

## 2013-09-16 NOTE — Progress Notes (Signed)
  Echocardiogram 2D Echocardiogram has been performed.  Nestor Ramp M 09/16/2013, 11:40 AM

## 2013-09-16 NOTE — Progress Notes (Signed)
Inpatient Diabetes Program Recommendations  AACE/ADA: New Consensus Statement on Inpatient Glycemic Control (2013)  Target Ranges:  Prepandial:   less than 140 mg/dL      Peak postprandial:   less than 180 mg/dL (1-2 hours)      Critically ill patients:  140 - 180 mg/dL   Reason for Visit: Results for CAMBREE, HENDRIX (MRN [id number]) as of 09/16/2013 16:14  Ref. Range 09/15/2013 21:39 09/16/2013 08:03 09/16/2013 12:30  Glucose-Capillary Latest Range: 70-99 mg/dL 469 (H) 629 (H) 528 (H)  Results for ACHSAH, MCQUADE (MRN [id number]) as of 09/16/2013 16:14  Ref. Range 09/15/2013 12:44  Hemoglobin A1C Latest Range: <5.7 % 10.0 (H)   Spoke to patient regarding home diabetes management.  She states that she takes Novolog 35 units tid with meals at home.  She states that she used to take Lantus but stopped several years ago.  Discussed elevated A1C.  She states that she need MD to manage diabetes.  She also admits to not checking CBG's.  States that she takes insulin based on how she feels. Will likely need basal insulin at discharge.  Consider increasing Lantus to 42 units daily (0.3 units/kg while) in the hospital.    Thanks, Beryl Meager, RN, BC-ADM Inpatient Diabetes Coordinator Pager [phone]

## 2013-09-17 ENCOUNTER — Inpatient Hospital Stay (HOSPITAL_COMMUNITY): Payer: Medicaid Other

## 2013-09-17 DIAGNOSIS — N289 Disorder of kidney and ureter, unspecified: Secondary | ICD-10-CM

## 2013-09-17 DIAGNOSIS — D649 Anemia, unspecified: Secondary | ICD-10-CM

## 2013-09-17 DIAGNOSIS — N189 Chronic kidney disease, unspecified: Secondary | ICD-10-CM

## 2013-09-17 DIAGNOSIS — I5043 Acute on chronic combined systolic (congestive) and diastolic (congestive) heart failure: Secondary | ICD-10-CM

## 2013-09-17 DIAGNOSIS — I509 Heart failure, unspecified: Secondary | ICD-10-CM

## 2013-09-17 LAB — GLUCOSE, CAPILLARY
Glucose-Capillary: 255 mg/dL — ABNORMAL HIGH (ref 70–99)
Glucose-Capillary: 245 mg/dL — ABNORMAL HIGH (ref 70–99)
Glucose-Capillary: 181 mg/dL — ABNORMAL HIGH (ref 70–99)
Glucose-Capillary: 169 mg/dL — ABNORMAL HIGH (ref 70–99)

## 2013-09-17 LAB — BASIC METABOLIC PANEL
CO2: 18 mEq/L — ABNORMAL LOW (ref 19–32)
Calcium: 8.1 mg/dL — ABNORMAL LOW (ref 8.4–10.5)
GFR calc Af Amer: 23 mL/min — ABNORMAL LOW (ref >90)
GFR calc non Af Amer: 20 mL/min — ABNORMAL LOW (ref >90)
Sodium: 130 mEq/L — ABNORMAL LOW (ref 135–145)

## 2013-09-17 LAB — CBC
Platelets: 374 10*3/uL (ref 150–400)
RBC: 3.67 MIL/uL — ABNORMAL LOW (ref 3.87–5.11)
RDW: 13.6 % (ref 11.5–15.5)
WBC: 13 10*3/uL — ABNORMAL HIGH (ref 4.0–10.5)

## 2013-09-17 LAB — EXPECTORATED SPUTUM ASSESSMENT W GRAM STAIN, RFLX TO RESP C

## 2013-09-17 LAB — PROTEIN / CREATININE RATIO, URINE
Creatinine, Urine: 98.54 mg/dL
Total Protein, Urine: 55.2 mg/dL

## 2013-09-17 MED ORDER — INSULIN GLARGINE 100 UNIT/ML ~~LOC~~ SOLN
20.0000 [IU] | Freq: Every day | SUBCUTANEOUS | Status: DC
  Administered 2013-09-17 – 2013-09-21 (×5): 20 [IU] via SUBCUTANEOUS
  Filled 2013-09-17 (×6): qty 0.2

## 2013-09-17 MED ORDER — HYDRALAZINE HCL 25 MG PO TABS
25.0000 mg | ORAL_TABLET | Freq: Three times a day (TID) | ORAL | Status: DC
  Administered 2013-09-17 – 2013-09-18 (×3): 25 mg via ORAL
  Filled 2013-09-17 (×6): qty 1

## 2013-09-17 MED ORDER — ALBUTEROL SULFATE (5 MG/ML) 0.5% IN NEBU
2.5000 mg | INHALATION_SOLUTION | Freq: Three times a day (TID) | RESPIRATORY_TRACT | Status: DC
  Administered 2013-09-17 – 2013-09-18 (×4): 2.5 mg via RESPIRATORY_TRACT
  Filled 2013-09-17 (×5): qty 0.5

## 2013-09-17 MED ORDER — IPRATROPIUM BROMIDE 0.02 % IN SOLN
0.5000 mg | Freq: Three times a day (TID) | RESPIRATORY_TRACT | Status: DC
  Administered 2013-09-17 – 2013-09-18 (×4): 0.5 mg via RESPIRATORY_TRACT
  Filled 2013-09-17 (×5): qty 2.5

## 2013-09-17 MED ORDER — FUROSEMIDE 10 MG/ML IJ SOLN
120.0000 mg | Freq: Three times a day (TID) | INTRAVENOUS | Status: DC
  Administered 2013-09-17 – 2013-09-22 (×14): 120 mg via INTRAVENOUS
  Filled 2013-09-17 (×23): qty 12

## 2013-09-17 MED ORDER — ISOSORBIDE DINITRATE 20 MG PO TABS
20.0000 mg | ORAL_TABLET | Freq: Three times a day (TID) | ORAL | Status: DC
  Administered 2013-09-17 – 2013-09-22 (×13): 20 mg via ORAL
  Filled 2013-09-17 (×17): qty 1

## 2013-09-17 MED ORDER — FUROSEMIDE 10 MG/ML IJ SOLN: 60.0000 mg | Freq: Two times a day (BID) | INTRAMUSCULAR | Status: DC

## 2013-09-17 MED ORDER — ACETAMINOPHEN 325 MG PO TABS
650.0000 mg | ORAL_TABLET | Freq: Four times a day (QID) | ORAL | Status: DC | PRN
  Administered 2013-09-17 (×2): 650 mg via ORAL
  Filled 2013-09-17 (×2): qty 2

## 2013-09-17 MED ORDER — METHYLPREDNISOLONE SODIUM SUCC 40 MG IJ SOLR
20.0000 mg | Freq: Two times a day (BID) | INTRAMUSCULAR | Status: DC
  Administered 2013-09-17 – 2013-09-18 (×2): 20 mg via INTRAVENOUS
  Filled 2013-09-17 (×4): qty 0.5

## 2013-09-17 NOTE — Progress Notes (Signed)
PATIENT DETAILS Name: [name] Age: 32 y.o. Sex: female Date of Birth: Feb 11, 1981 Admit Date: 09/15/2013 Admitting Physician Maryruth Bun Rama, MD PCP:No PCP Per Patient  Subjective: No major complaints-breathing essentially unchanged.  Assessment/Plan: Principal Problem:   Community acquired pneumonia - Afebrile, no leukocytosis. Somewhat improved clinically compared to admission - Continue with Rocephin and Zithromax-day 3. - Blood cultures 12/18- pending - Sputum culture 12/19 pending - Influenza PCR negative - HIV negative - Urine strep pneumo antigen negative, Legionella antigen negative.  Active Problems: Acute hypoxic respiratory failure - Secondary to above and from acute asthmatic bronchitis/exacerbation and acute systolic heart failure - Continue oxygen via nasal cannula, continue scheduled nebulized bronchodilators,intravenous steroids. Continue IV Levaquin.Stat IV lasix - Taper off oxygen as tolerated  Asthma with exacerbation - Continue nebulized bronchodilators, intravenous steroids-but taper, antibiotics as above. - Add incentive spirometry and flutter valve.  Acute Systolic CHF -Chest XRay this morning showing pul edema -stop IVF -start Lasix -c/w Metoprolol -No ACEI/ARB given CKD  -daily weights -strict I&O's  Lower extremity edema -2/2 Nephrotic syndrome and CHF -diurese as tolerated  Suspect CKD Stage 4 -suspect diabetic neprhopathy- Urinalysis does show proteinuria- -initially thought that patient could have acute on chronic renal failure, however no improvement with hydration, known pulmonary edema. Suspect patient to have chronic kidney disease stage IV. -Check a renal ultrasound -check spot urine protein/creatinine ratio, SPEP, UPEP -HIV negative - Nephrology evaluation  Diabetes - CBGs reviewed, sugars uncontrolled likely secondary to steroids. Hemoglobin A1c 10.0 - Increase Lantus 20 units each bedtime, c/w NovoLog 10 units 3  times a day. Continue with SSI. We'll continue to adjust Lantus/NovoLog depending on CBG readings.  Hypertension - stop amlodipine-start IV lasix, if BP tolerates-will add Hydralazine/Imdur -c/w Metoprolol - We'll continue to monitor blood pressure trend and adjust accordingly.  Nausea with vomiting - Resolved, continue with supportive care.  Anemia -suspect secondary to CKD -await anemia w/u  Dyslipidemia - Continue with statins  Disposition: Remain inpatient  DVT Prophylaxis: Prophylactic Lovenox   Code Status: Full code or  Family Communication None at bedside  Procedures:  None  CONSULTS:  None  Time spent 45 minutes-which includes 50% of the time with face-to-face with patient  MEDICATIONS: Scheduled Meds: . ipratropium  0.5 mg Nebulization TID   And  . albuterol  2.5 mg Nebulization TID  . albuterol  5 mg Nebulization Once  . amLODipine  10 mg Oral Daily  . atorvastatin  40 mg Oral Daily  . azithromycin  500 mg Oral Daily  . budesonide (PULMICORT) nebulizer solution  0.25 mg Nebulization BID  . cefTRIAXone (ROCEPHIN)  IV  1 g Intravenous Q24H  . chlorpheniramine-HYDROcodone  5 mL Oral Q12H  . enoxaparin (LOVENOX) injection  40 mg Subcutaneous Q24H  . furosemide  60 mg Intravenous BID  . hydrALAZINE  25 mg Oral Q8H  . insulin aspart  0-20 Units Subcutaneous TID WC  . insulin aspart  0-5 Units Subcutaneous QHS  . insulin aspart  10 Units Subcutaneous TID WC  . insulin glargine  15 Units Subcutaneous QHS  . isosorbide dinitrate  20 mg Oral TID  . methylPREDNISolone (SOLU-MEDROL) injection  40 mg Intravenous Q12H  . metoprolol tartrate  12.5 mg Oral BID   Continuous Infusions:   PRN Meds:.albuterol, ondansetron (ZOFRAN) IV  Antibiotics: Anti-infectives   Start     Dose/Rate Route Frequency Ordered Stop   09/16/13 1000  azithromycin (ZITHROMAX) tablet 500 mg     500 mg Oral  Daily 09/16/13 0946 09/23/13 0959   09/16/13 0600  cefTRIAXone  (ROCEPHIN) 1 g in dextrose 5 % 50 mL IVPB     1 g 100 mL/hr over 30 Minutes Intravenous Every 24 hours 09/15/13 2000 09/23/13 0559   09/15/13 1815  cefTRIAXone (ROCEPHIN) 1 g in dextrose 5 % 50 mL IVPB  Status:  Discontinued     1 g 100 mL/hr over 30 Minutes Intravenous Every 24 hours 09/15/13 1813 09/15/13 1959   09/15/13 1815  azithromycin (ZITHROMAX) 500 mg in dextrose 5 % 250 mL IVPB  Status:  Discontinued     500 mg 250 mL/hr over 60 Minutes Intravenous Every 24 hours 09/15/13 1813 09/16/13 0945   09/15/13 1645  cefTRIAXone (ROCEPHIN) 1 g in dextrose 5 % 50 mL IVPB     1 g 100 mL/hr over 30 Minutes Intravenous  Once 09/15/13 1642 09/15/13 1844   09/15/13 1645  azithromycin (ZITHROMAX) 500 mg in dextrose 5 % 250 mL IVPB  Status:  Discontinued     500 mg 250 mL/hr over 60 Minutes Intravenous  Once 09/15/13 1642 09/16/13 1020       PHYSICAL EXAM: Vital signs in last 24 hours: Filed Vitals:   09/16/13 1432 09/16/13 2123 09/16/13 2153 09/17/13 0459  BP: 134/75  130/74 142/88  Pulse: 109 106 112 93  Temp: 98 F (36.7 C)  97.7 F (36.5 C) 97.7 F (36.5 C)  TempSrc: Oral  Oral Oral  Resp: 18 20 20 20   Height:      Weight:      SpO2: 93% 96% 95% 92%    Weight change:  Filed Weights   09/15/13 1210 09/15/13 2003  Weight: 142.747 kg (314 lb 11.2 oz) 143.4 kg (316 lb 2.2 oz)   Body mass index is 48.08 kg/(m^2).   Gen Exam: Awake and alert with clear speech.  Not in acute distress. Neck: Supple, No JVD.  Chest: Good air entry bilaterally coarse rhonchi and rales heard in all lung zones. CVS: S1 S2 Regular, no murmurs.  Abdomen: soft, BS +, non tender, non distended.  Extremities: 2+ edema, lower extremities warm to touch. Neurologic: Non Focal.   Skin: No Rash.   Wounds: N/A.   Intake/Output from previous day:  Intake/Output Summary (Last 24 hours) at 09/17/13 1345 Last data filed at 09/16/13 1800  Gross per 24 hour  Intake  427.5 ml  Output      0 ml  Net  427.5  ml     LAB RESULTS: CBC  Recent Labs Lab 09/15/13 1244 09/15/13 2020 09/17/13 0850  WBC 4.0 7.7 13.0*  HGB 9.9* 9.0* 9.9*  HCT 30.0* 26.8* 30.5*  PLT 312 263 374  MCV 82.4 82.0 83.1  MCH 27.2 27.5 27.0  MCHC 33.0 33.6 32.5  RDW 13.6 13.6 13.6  LYMPHSABS 1.8  --   --   MONOABS 0.3  --   --   EOSABS 0.0  --   --   BASOSABS 0.0  --   --     Chemistries   Recent Labs Lab 09/15/13 1244 09/15/13 2020 09/16/13 0608 09/17/13 0850  NA 131*  --  127* 130*  K 3.6  --  4.2 4.3  CL 99  --  96 99  CO2 21  --  17* 18*  GLUCOSE 184*  --  392* 321*  BUN 39*  --  37* 43*  CREATININE 2.72* 2.66* 2.59* 2.93*  CALCIUM 8.1*  --  8.1* 8.1*  CBG:  Recent Labs Lab 09/16/13 1230 09/16/13 1656 09/16/13 2159 09/17/13 0747 09/17/13 1205  GLUCAP 341* 315* 217* 255* 245*    GFR Estimated Creatinine Clearance: 40.9 ml/min (by C-G formula based on Cr of 2.93).  Coagulation profile No results found for this basename: INR, PROTIME,  in the last 168 hours  Cardiac Enzymes No results found for this basename: CK, CKMB, TROPONINI, MYOGLOBIN,  in the last 168 hours  No components found with this basename: POCBNP,  No results found for this basename: DDIMER,  in the last 72 hours  Recent Labs  09/15/13 1244  HGBA1C 10.0*   No results found for this basename: CHOL, HDL, LDLCALC, TRIG, CHOLHDL, LDLDIRECT,  in the last 72 hours No results found for this basename: TSH, T4TOTAL, FREET3, T3FREE, THYROIDAB,  in the last 72 hours No results found for this basename: VITAMINB12, FOLATE, FERRITIN, TIBC, IRON, RETICCTPCT,  in the last 72 hours No results found for this basename: LIPASE, AMYLASE,  in the last 72 hours  Urine Studies No results found for this basename: UACOL, UAPR, USPG, UPH, UTP, UGL, UKET, UBIL, UHGB, UNIT, UROB, ULEU, UEPI, UWBC, URBC, UBAC, CAST, CRYS, UCOM, BILUA,  in the last 72 hours  MICROBIOLOGY: Recent Results (from the past 240 hour(s))  CULTURE, EXPECTORATED  SPUTUM-ASSESSMENT     Status: None   Collection Time    09/15/13 11:06 PM      Result Value Range Status   Specimen Description SPUTUM   Final   Special Requests NONE   Final   Sputum evaluation     Final   Value: MICROSCOPIC FINDINGS SUGGEST THAT THIS SPECIMEN IS NOT REPRESENTATIVE OF LOWER RESPIRATORY SECRETIONS. PLEASE RECOLLECT.     CALLED TO A.THOMPSON,RN 0020 09/16/13 M.CAMPBELL   Report Status 09/16/2013 FINAL   Final  CULTURE, EXPECTORATED SPUTUM-ASSESSMENT     Status: None   Collection Time    09/16/13 10:20 PM      Result Value Range Status   Specimen Description SPUTUM   Final   Special Requests NONE   Final   Sputum evaluation     Final   Value: THIS SPECIMEN IS ACCEPTABLE. RESPIRATORY CULTURE REPORT TO FOLLOW.   Report Status 09/17/2013 FINAL   Final  CULTURE, RESPIRATORY (NON-EXPECTORATED)     Status: None   Collection Time    09/16/13 10:20 PM      Result Value Range Status   Specimen Description SPUTUM   Final   Special Requests NONE   Final   Gram Stain     Final   Value: MODERATE WBC PRESENT,BOTH PMN AND MONONUCLEAR     NO SQUAMOUS EPITHELIAL CELLS SEEN     NO ORGANISMS SEEN     Performed at [facility]   Culture PENDING   Incomplete   Report Status PENDING   Incomplete    RADIOLOGY STUDIES/RESULTS: Dg Chest 2 View  09/15/2013   CLINICAL DATA:  Cough for 2-1/2 weeks with shortness of breath and fever at times.  EXAM: CHEST  2 VIEW  COMPARISON:  09/16/2013  FINDINGS: Lungs are adequately inflated with bilateral patchy airspace opacification most prominent over the left base with mild overall worsening. Findings are likely due to infection. No evidence of effusion. Mild stable cardiomegaly. Remainder of the exam is unchanged.  IMPRESSION: Mild worsening of a patchy bilateral airspace process worse over the left base likely due to infection.  Mild stable cardiomegaly.   Electronically Signed   By: Elberta Fortis  M.D.   On: 09/15/2013 15:58   Dg Chest 2  View  09/06/2013   CLINICAL DATA:  Cough, fever.  EXAM: CHEST  2 VIEW  COMPARISON:  None.  FINDINGS: The heart size and mediastinal contours are within normal limits. No pneumothorax or pleural effusion is noted. Bilateral peribronchial thickening is noted concerning for bronchitis or possibly perihilar pneumonitis. The visualized skeletal structures are unremarkable.  IMPRESSION: Findings consistent with either bilateral bronchitis or perihilar pneumonitis.   Electronically Signed   By: Roque Lias M.D.   On: 09/06/2013 11:07    Jeoffrey Massed, MD  [facility] Pager:[phone]  If 7PM-7AM, please contact night-coverage [email or web address] Password [email or web address] 09/17/2013, 1:45 PM   LOS: 2 days

## 2013-09-17 NOTE — Progress Notes (Signed)
Pharmacist Heart Failure Core Measure Documentation  Assessment: [name] has an EF documented as 35-40%  on 09/16/13 by ECHO.  Rationale: Heart failure patients with left ventricular systolic dysfunction (LVSD) and an EF < 40% should be prescribed an angiotensin converting enzyme inhibitor (ACEI) or angiotensin receptor blocker (ARB) at discharge unless a contraindication is documented in the medical record.  This patient is not currently on an ACEI or ARB for HF.  This note is being placed in the record in order to provide documentation that a contraindication to the use of these agents is present for this encounter.  ACE Inhibitor or Angiotensin Receptor Blocker is contraindicated (specify all that apply)  []   ACEI allergy AND ARB allergy []   Angioedema []   Moderate or severe aortic stenosis []   Hyperkalemia []   Hypotension []   Renal artery stenosis [x]   Worsening renal function, preexisting renal disease or dysfunction   Jenavive Lamboy C. Makale Pindell, PharmD Clinical Pharmacist-Resident Pager: [phone] Pharmacy: [phone] 09/17/2013 2:32 PM

## 2013-09-17 NOTE — Progress Notes (Addendum)
Subjective:  Grossly edematous, SOB controlled while wearing oxygen at rest Lost IV access and needs a central line On my review of her echo there is global hypokinesis, without clear regional differences  Objective:  Temp:  [97.7 F (36.5 C)-98 F (36.7 C)] 97.7 F (36.5 C) (12/20 0459) Pulse Rate:  [93-112] 93 (12/20 0459) Resp:  [18-20] 20 (12/20 0459) BP: (130-142)/(74-88) 142/88 mmHg (12/20 0459) SpO2:  [92 %-96 %] 92 % (12/20 0459) Weight change:   Intake/Output from previous day: 12/19 0701 - 12/20 0700 In: 667.5 [P.O.:240; I.V.:377.5; IV Piggyback:50] Out: -   Intake/Output from this shift:    Medications: Current Facility-Administered Medications  Medication Dose Route Frequency Provider Last Rate Last Dose  . albuterol (PROVENTIL) (5 MG/ML) 0.5% nebulizer solution 2.5 mg  2.5 mg Nebulization Q2H PRN Shanker Levora Dredge, MD      . ipratropium (ATROVENT) nebulizer solution 0.5 mg  0.5 mg Nebulization TID Maretta Bees, MD   0.5 mg at 09/17/13 0830   And  . albuterol (PROVENTIL) (5 MG/ML) 0.5% nebulizer solution 2.5 mg  2.5 mg Nebulization TID Maretta Bees, MD   2.5 mg at 09/17/13 0830  . albuterol (PROVENTIL) (5 MG/ML) 0.5% nebulizer solution 5 mg  5 mg Nebulization Once Gavin Pound. Ghim, MD      . amLODipine (NORVASC) tablet 10 mg  10 mg Oral Daily Maryruth Bun Rama, MD   10 mg at 09/17/13 1109  . atorvastatin (LIPITOR) tablet 40 mg  40 mg Oral Daily Maryruth Bun Rama, MD   40 mg at 09/17/13 1109  . azithromycin (ZITHROMAX) tablet 500 mg  500 mg Oral Daily Maretta Bees, MD   500 mg at 09/17/13 1109  . budesonide (PULMICORT) nebulizer solution 0.25 mg  0.25 mg Nebulization BID Maretta Bees, MD   0.25 mg at 09/16/13 2121  . cefTRIAXone (ROCEPHIN) 1 g in dextrose 5 % 50 mL IVPB  1 g Intravenous Q24H Maryruth Bun Rama, MD   1 g at 09/17/13 4098  . chlorpheniramine-HYDROcodone (TUSSIONEX) 10-8 MG/5ML suspension 5 mL  5 mL Oral Q12H Maryruth Bun Rama, MD   5  mL at 09/17/13 1109  . enoxaparin (LOVENOX) injection 40 mg  40 mg Subcutaneous Q24H Maryruth Bun Rama, MD   40 mg at 09/16/13 2128  . insulin aspart (novoLOG) injection 0-20 Units  0-20 Units Subcutaneous TID WC Maryruth Bun Rama, MD   11 Units at 09/17/13 0825  . insulin aspart (novoLOG) injection 0-5 Units  0-5 Units Subcutaneous QHS Maryruth Bun Rama, MD   2 Units at 09/16/13 2213  . insulin aspart (novoLOG) injection 10 Units  10 Units Subcutaneous TID WC Maretta Bees, MD   10 Units at 09/17/13 0825  . insulin glargine (LANTUS) injection 15 Units  15 Units Subcutaneous QHS Maretta Bees, MD   15 Units at 09/16/13 2214  . methylPREDNISolone sodium succinate (SOLU-MEDROL) 40 mg/mL injection 40 mg  40 mg Intravenous Q12H Maryruth Bun Rama, MD   40 mg at 09/17/13 0615  . metoprolol tartrate (LOPRESSOR) tablet 12.5 mg  12.5 mg Oral BID Eda Paschal Kilroy, PA-C   12.5 mg at 09/17/13 1109  . ondansetron (ZOFRAN) injection 4 mg  4 mg Intravenous Q6H PRN Maryruth Bun Rama, MD        Physical Exam: General appearance: alert, cooperative and no distress Neck: no adenopathy, no carotid bruit, supple, symmetrical, trachea midline, thyroid not enlarged, symmetric, no tenderness/mass/nodules and hard to  see JVP due to obesity Lungs: clear to auscultation bilaterally Heart: regular rate and rhythm, S1, S2 normal, S3 present, no click and no rub Abdomen: soft, non-tender; bowel sounds normal; no masses,  no organomegaly Extremities: edema 3+ pitting pretibially bilaterally, swelling of hands and face Pulses: 2+ and symmetric Skin:  pale Neurologic: Alert and oriented X 3, normal strength and tone. Normal symmetric reflexes. Normal coordination and gait  Lab Results: Results for orders placed during the hospital encounter of 09/15/13 (from the past 48 hour(s))  HIV ANTIBODY (ROUTINE TESTING)     Status: None   Collection Time    09/15/13  8:20 PM      Result Value Range   HIV NON REACTIVE  NON REACTIVE    Comment: Performed at [facility]  CBC     Status: Abnormal   Collection Time    09/15/13  8:20 PM      Result Value Range   WBC 7.7  4.0 - 10.5 K/uL   RBC 3.27 (*) 3.87 - 5.11 MIL/uL   Hemoglobin 9.0 (*) 12.0 - 15.0 g/dL   HCT 16.1 (*) 09.6 - 04.5 %   MCV 82.0  78.0 - 100.0 fL   MCH 27.5  26.0 - 34.0 pg   MCHC 33.6  30.0 - 36.0 g/dL   RDW 40.9  81.1 - 91.4 %   Platelets 263  150 - 400 K/uL  CREATININE, SERUM     Status: Abnormal   Collection Time    09/15/13  8:20 PM      Result Value Range   Creatinine, Ser 2.66 (*) 0.50 - 1.10 mg/dL   GFR calc non Af Amer 22 (*) >90 mL/min   GFR calc Af Amer 26 (*) >90 mL/min   Comment: (NOTE)     The eGFR has been calculated using the CKD EPI equation.     This calculation has not been validated in all clinical situations.     eGFR's persistently <90 mL/min signify possible Chronic Kidney     Disease.  PRO B NATRIURETIC PEPTIDE     Status: Abnormal   Collection Time    09/15/13  8:20 PM      Result Value Range   Pro B Natriuretic peptide (BNP) 7879.0 (*) 0 - 125 pg/mL  GLUCOSE, CAPILLARY     Status: Abnormal   Collection Time    09/15/13  9:39 PM      Result Value Range   Glucose-Capillary 179 (*) 70 - 99 mg/dL   Comment 1 Documented in Chart     Comment 2 Notify RN    URINALYSIS, ROUTINE W REFLEX MICROSCOPIC     Status: Abnormal   Collection Time    09/15/13 11:06 PM      Result Value Range   Color, Urine YELLOW  YELLOW   APPearance CLEAR  CLEAR   Specific Gravity, Urine 1.015  1.005 - 1.030   pH 6.0  5.0 - 8.0   Glucose, UA 250 (*) NEGATIVE mg/dL   Hgb urine dipstick MODERATE (*) NEGATIVE   Bilirubin Urine NEGATIVE  NEGATIVE   Ketones, ur NEGATIVE  NEGATIVE mg/dL   Protein, ur >782 (*) NEGATIVE mg/dL   Urobilinogen, UA 0.2  0.0 - 1.0 mg/dL   Nitrite NEGATIVE  NEGATIVE   Leukocytes, UA NEGATIVE  NEGATIVE  INFLUENZA PANEL BY PCR     Status: None   Collection Time    09/15/13 11:06 PM      Result Value  Range    Influenza A By PCR NEGATIVE  NEGATIVE   Influenza B By PCR NEGATIVE  NEGATIVE   H1N1 flu by pcr NOT DETECTED  NOT DETECTED   Comment:            The Xpert Flu assay (FDA approved for     nasal aspirates or washes and     nasopharyngeal swab specimens), is     intended as an aid in the diagnosis of     influenza and should not be used as     a sole basis for treatment.  LEGIONELLA ANTIGEN, URINE     Status: None   Collection Time    09/15/13 11:06 PM      Result Value Range   Specimen Description URINE, RANDOM     Special Requests NONE     Legionella Antigen, Urine       Value: Negative for Legionella pneumophilia serogroup 1     Performed at [facility]   Report Status 09/16/2013 FINAL    STREP PNEUMONIAE URINARY ANTIGEN     Status: None   Collection Time    09/15/13 11:06 PM      Result Value Range   Strep Pneumo Urinary Antigen NEGATIVE  NEGATIVE   Comment:            Infection due to S. pneumoniae     cannot be absolutely ruled out     since the antigen present     may be below the detection limit     of the test.  URINE MICROSCOPIC-ADD ON     Status: None   Collection Time    09/15/13 11:06 PM      Result Value Range   Squamous Epithelial / LPF RARE  RARE   WBC, UA 0-2  <3 WBC/hpf   RBC / HPF 3-6  <3 RBC/hpf   Bacteria, UA RARE  RARE  CULTURE, EXPECTORATED SPUTUM-ASSESSMENT     Status: None   Collection Time    09/15/13 11:06 PM      Result Value Range   Specimen Description SPUTUM     Special Requests NONE     Sputum evaluation       Value: MICROSCOPIC FINDINGS SUGGEST THAT THIS SPECIMEN IS NOT REPRESENTATIVE OF LOWER RESPIRATORY SECRETIONS. PLEASE RECOLLECT.     CALLED TO A.THOMPSON,RN 0020 09/16/13 M.CAMPBELL   Report Status 09/16/2013 FINAL    BASIC METABOLIC PANEL     Status: Abnormal   Collection Time    09/16/13  6:08 AM      Result Value Range   Sodium 127 (*) 135 - 145 mEq/L   Potassium 4.2  3.5 - 5.1 mEq/L   Chloride 96  96 - 112 mEq/L    CO2 17 (*) 19 - 32 mEq/L   Glucose, Bld 392 (*) 70 - 99 mg/dL   BUN 37 (*) 6 - 23 mg/dL   Creatinine, Ser 4.09 (*) 0.50 - 1.10 mg/dL   Calcium 8.1 (*) 8.4 - 10.5 mg/dL   GFR calc non Af Amer 23 (*) >90 mL/min   GFR calc Af Amer 27 (*) >90 mL/min   Comment: (NOTE)     The eGFR has been calculated using the CKD EPI equation.     This calculation has not been validated in all clinical situations.     eGFR's persistently <90 mL/min signify possible Chronic Kidney     Disease.  GLUCOSE, CAPILLARY     Status: Abnormal  Collection Time    09/16/13  8:03 AM      Result Value Range   Glucose-Capillary 382 (*) 70 - 99 mg/dL  GLUCOSE, CAPILLARY     Status: Abnormal   Collection Time    09/16/13 12:30 PM      Result Value Range   Glucose-Capillary 341 (*) 70 - 99 mg/dL  GLUCOSE, CAPILLARY     Status: Abnormal   Collection Time    09/16/13  4:56 PM      Result Value Range   Glucose-Capillary 315 (*) 70 - 99 mg/dL  GLUCOSE, CAPILLARY     Status: Abnormal   Collection Time    09/16/13  9:59 PM      Result Value Range   Glucose-Capillary 217 (*) 70 - 99 mg/dL  CULTURE, EXPECTORATED SPUTUM-ASSESSMENT     Status: None   Collection Time    09/16/13 10:20 PM      Result Value Range   Specimen Description SPUTUM     Special Requests NONE     Sputum evaluation       Value: THIS SPECIMEN IS ACCEPTABLE. RESPIRATORY CULTURE REPORT TO FOLLOW.   Report Status 09/17/2013 FINAL    CULTURE, RESPIRATORY (NON-EXPECTORATED)     Status: None   Collection Time    09/16/13 10:20 PM      Result Value Range   Specimen Description SPUTUM     Special Requests NONE     Gram Stain       Value: MODERATE WBC PRESENT,BOTH PMN AND MONONUCLEAR     NO SQUAMOUS EPITHELIAL CELLS SEEN     NO ORGANISMS SEEN     Performed at [facility]   Culture PENDING     Report Status PENDING    GLUCOSE, CAPILLARY     Status: Abnormal   Collection Time    09/17/13  7:47 AM      Result Value Range    Glucose-Capillary 255 (*) 70 - 99 mg/dL  BASIC METABOLIC PANEL     Status: Abnormal   Collection Time    09/17/13  8:50 AM      Result Value Range   Sodium 130 (*) 135 - 145 mEq/L   Potassium 4.3  3.5 - 5.1 mEq/L   Chloride 99  96 - 112 mEq/L   CO2 18 (*) 19 - 32 mEq/L   Glucose, Bld 321 (*) 70 - 99 mg/dL   BUN 43 (*) 6 - 23 mg/dL   Creatinine, Ser 1.61 (*) 0.50 - 1.10 mg/dL   Calcium 8.1 (*) 8.4 - 10.5 mg/dL   GFR calc non Af Amer 20 (*) >90 mL/min   GFR calc Af Amer 23 (*) >90 mL/min   Comment: (NOTE)     The eGFR has been calculated using the CKD EPI equation.     This calculation has not been validated in all clinical situations.     eGFR's persistently <90 mL/min signify possible Chronic Kidney     Disease.  CBC     Status: Abnormal   Collection Time    09/17/13  8:50 AM      Result Value Range   WBC 13.0 (*) 4.0 - 10.5 K/uL   RBC 3.67 (*) 3.87 - 5.11 MIL/uL   Hemoglobin 9.9 (*) 12.0 - 15.0 g/dL   HCT 09.6 (*) 04.5 - 40.9 %   MCV 83.1  78.0 - 100.0 fL   MCH 27.0  26.0 - 34.0 pg   MCHC 32.5  30.0 - 36.0 g/dL   RDW 40.9  81.1 - 91.4 %   Platelets 374  150 - 400 K/uL  GLUCOSE, CAPILLARY     Status: Abnormal   Collection Time    09/17/13 12:05 PM      Result Value Range   Glucose-Capillary 245 (*) 70 - 99 mg/dL    Imaging: Imaging results have been reviewed and Dg Chest 2 View  09/15/2013   CLINICAL DATA:  Cough for 2-1/2 weeks with shortness of breath and fever at times.  EXAM: CHEST  2 VIEW  COMPARISON:  09/16/2013  FINDINGS: Lungs are adequately inflated with bilateral patchy airspace opacification most prominent over the left base with mild overall worsening. Findings are likely due to infection. No evidence of effusion. Mild stable cardiomegaly. Remainder of the exam is unchanged.  IMPRESSION: Mild worsening of a patchy bilateral airspace process worse over the left base likely due to infection.  Mild stable cardiomegaly.   Electronically Signed   By: Elberta Fortis  M.D.   On: 09/15/2013 15:58   Dg Chest Port 1 View  09/17/2013   CLINICAL DATA:  Short of breath.  EXAM: PORTABLE CHEST - 1 VIEW  COMPARISON:  09/15/2013.  FINDINGS: Lung volumes are lower than on prior. The cardiopericardial silhouette remains enlarged. Diffuse airspace opacity is present with basilar predominant compatible with CHF and pulmonary edema.  IMPRESSION: Worsening CHF, now moderate.   Electronically Signed   By: Andreas Newport M.D.   On: 09/17/2013 07:44    Assessment:  1. Principal Problem: 2.   Community acquired pneumonia 3. Active Problems: 4.   Nausea and vomiting 5.   Diabetes mellitus- uncontrolled 6.   Normocytic anemia 7.   Acute renal insuficency 8.   Dehydration 9.   Hyperlipidemia 10.   Hypertension 11.   Asthma exacerbation 12.   Cardiomyopathy- etiol undetermined- EF 35-40% 13.   Morbid obesity- BMI 48 14.   Plan:  1. She is severely hypervolemic with evidence of biventricular failure. Suspect she has nephrotic syndrome.  2. Cardiac diagnostic procedures and therapy are limited by severe renal insufficiency. Ideally, she would undergo coronary angio - despite her young age she is at higher risk of CAD due to >20 years IDDM. ACEi are relatively contraindicated unless renal function improves. 3. Nuclear cardiac scintigram on Monday (Lexiscan) 4. Start Hydralazine/nitrates 5. Needs diuresis once IV access is available  Time Spent Directly with Patient:  45 minutes  Length of Stay:  LOS: 2 days    Royce Stegman 09/17/2013, 1:08 PM

## 2013-09-17 NOTE — Consult Note (Signed)
Renal Service Consult Note [facility]  Neetu Carrozza 09/17/2013 Maree Krabbe Requesting Physician:  Dr Jerral Ralph  Reason for Consult:  Renal failure HPI: The patient is a 32 y.o. year-old with hx of HTN, DM, COPD and morbid obesity.  Recently moved here from [place] in September.  Admitted on 09/15/13 for feeling poorly.  Creatinine was 2.7, BNP high at 7879, CXR read LLL infiltrate and EF 35-40% by ECHO. UA shows >300 proteinuria and 3-6 rbc/hpf. She was admitted with dx of PNA and is getting empiric abx with rocephin and zithromax.   The patient had been seeing a kidney specialist in [place] prior to moving here.  They talked about monitoring the kidney function mostly, they did not talk about needing dialysis.  Denies ever having kidney biopsy, they said her kidney problems were likely due to diabetes.  The patient says she couldn't get her prescriptions filled after moving here so was without her medications for 2-3 months and just recently restarted them.  Home meds included aldactone and HCTZ, no lasix.  Also on norvasc, statin, insulin and prednisone.   Repeat CXR shows worsening CHF.   ROS  no f/c/d  +prod cough, PNA   No abd pain  no n/v/d  no jerking   no confusion  no nsaids or other OTC meds   Past Medical History  Past Medical History  Diagnosis Date  . Hypertension   . Asthma   . Hyperlipidemia   . Pneumonia     "couple times; have it now" (09/15/2013)  . Chronic bronchitis     "just about q yr" (09/15/2013)  . Shortness of breath     "just recently; related to the pneumonia" (09/15/2013)  . Type II diabetes mellitus   . Anemia   . Migraine     "get them alot" (09/15/2013)  . Arthritis     "left hand" (09/15/2013)  . Chronic kidney disease     "low kidney function" (09/15/2013)   Past Surgical History  Past Surgical History  Procedure Laterality Date  . Cesarean section  1999; 2006  . 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 Tonsillectomy  1997  . Tubal ligation  2006   Family History  Family History  Problem Relation Age of Onset  . Diabetes Mother   . Diabetes Maternal Grandmother   . Cancer Paternal Grandmother   . Hypertension Father   . Hyperlipidemia Father    Social History  reports that she has never smoked. She has never used smokeless tobacco. She reports that she drinks alcohol. She reports that she does not use illicit drugs. Allergies  Allergies  Allergen Reactions  . Aspirin Anaphylaxis  . Sulfur Hives  . Tramadol Other (See Comments)    Pt states she feels weird    Home medications Prior to Admission medications   Medication Sig Start Date End Date Taking? Authorizing Provider  albuterol (PROVENTIL HFA;VENTOLIN HFA) 108 (90 BASE) MCG/ACT inhaler Inhale into the lungs every 6 (six) hours as needed for wheezing or shortness of breath.   Yes Historical Provider, MD  albuterol (PROVENTIL) (2.5 MG/3ML) 0.083% nebulizer solution Take 2.5 mg by nebulization every 6 (six) hours as needed for wheezing or shortness of breath.   Yes Historical Provider, MD  amLODipine (NORVASC) 10 MG tablet Take 1 tablet (10 mg total) by mouth daily. 09/06/13  Yes Tatyana A Kirichenko, PA-C  atorvastatin (LIPITOR) 40 MG tablet Take  40 mg by mouth daily.   Yes Historical Provider, MD  DM-Doxylamine-Acetaminophen 15-6.25-325 MG/15ML LIQD Take 15 mLs by mouth every 6 (six) hours as needed (for cold symptoms).   Yes Historical Provider, MD  hydrochlorothiazide (HYDRODIURIL) 25 MG tablet Take 25 mg by mouth daily.   Yes Historical Provider, MD  insulin aspart (NOVOLOG FLEXPEN) 100 UNIT/ML SOPN FlexPen Inject 35 Units into the skin 3 (three) times daily with meals.    Yes Historical Provider, MD  spironolactone (ALDACTONE) 25 MG tablet Take 25 mg by mouth daily.   Yes Historical Provider, MD  predniSONE (DELTASONE) 20 MG tablet Take 2 tablets (40 mg total) by mouth daily.  09/06/13   Myriam Jacobson Kirichenko, PA-C   Liver Function Tests  Recent Labs Lab 09/15/13 1244  AST 20  ALT 13  ALKPHOS 76  BILITOT 0.2*  PROT 6.1  ALBUMIN 1.4*   No results found for this basename: LIPASE, AMYLASE,  in the last 168 hours CBC  Recent Labs Lab 09/15/13 1244 09/15/13 2020 09/17/13 0850  WBC 4.0 7.7 13.0*  NEUTROABS 2.0  --   --   HGB 9.9* 9.0* 9.9*  HCT 30.0* 26.8* 30.5*  MCV 82.4 82.0 83.1  PLT 312 263 374   Basic Metabolic Panel  Recent Labs Lab 09/15/13 1244 09/15/13 2020 09/16/13 0608 09/17/13 0850  NA 131*  --  127* 130*  K 3.6  --  4.2 4.3  CL 99  --  96 99  CO2 21  --  17* 18*  GLUCOSE 184*  --  392* 321*  BUN 39*  --  37* 43*  CREATININE 2.72* 2.66* 2.59* 2.93*  CALCIUM 8.1*  --  8.1* 8.1*    Exam  Blood pressure 142/88, pulse 93, temperature 97.7 F (36.5 C), temperature source Oral, resp. rate 20, height 5\' 8"  (1.727 m), weight 143.4 kg (316 lb 2.2 oz), last menstrual period 09/08/2013, SpO2 92.00%.  gen: alert, no distress  skin: no rash, cyanosis  heent: eomi, sclera anicteric, throat clear  neck: + jvd, R ij cath in place  chest: bilateral rales 1/3 up the post lung fields  cor: regular, no gallop or rub, no M, pedal pulses intact  abd: soft, obese, nontender, no HSM  ext: 2+ diffuse pitting edema of face, hands, legs, no joint effusion, no gangrene/ulcers  neuro: alert, ox3, nonfocal  Assessment/Plan: 1. CKD stage IV: will need to get records sometime but this is not new, she was seeing a nephrologist in [place] before moving here. 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  She has had DM for 23 years and HTN for about 10.  Check UPC ratio 2. Volume overload / pulm edema: very wet, increased Lasix 120 mg iv every 8 hrs 3. 2HPTH: check phos and PTH, corr Ca 10.1 4. Anemia / CKD: Hb 9.9, check fe stores, consider esa 5. DM2- onset age 65 , 23 yrs duration, hx retinopathy  Will follow.    Vinson Moselle MD (pgr) [phone]    [phone] 09/17/2013, 1:01 PM

## 2013-09-17 NOTE — ED Provider Notes (Signed)
Medical screening examination/treatment/procedure(s) were performed by non-physician practitioner and as supervising physician I was immediately available for consultation/collaboration.  Zyair Russi Y. Jedrek Dinovo, MD 09/17/13 1638 

## 2013-09-17 NOTE — Procedures (Signed)
Central Venous Catheter Insertion Procedure Note [name] [id number] October 07, 1980  Procedure: Insertion of Central Venous Catheter Indications: Drug and/or fluid administration  Procedure Details Consent: Risks of procedure as well as the alternatives and risks of each were explained to the (patient/caregiver).  Consent for procedure obtained. Time Out: Verified patient identification, verified procedure, site/side was marked, verified correct patient position, special equipment/implants available, medications/allergies/relevent history reviewed, required imaging and test results available.  Performed  Maximum sterile technique was used including antiseptics, cap, gloves, gown, hand hygiene, mask and sheet. Skin prep: Chlorhexidine; local anesthetic administered A antimicrobial bonded/coated triple lumen catheter was placed in the right internal jugular vein using the Seldinger technique.  Evaluation Blood flow good Complications: No apparent complications Patient did tolerate procedure well. Chest X-ray ordered to verify placement.  CXR: pending.  [name] 09/17/2013, 1:11 PM  [name]. Beth Alias, MD, FACP Pgr: [phone] [facility]'

## 2013-09-18 ENCOUNTER — Inpatient Hospital Stay (HOSPITAL_COMMUNITY): Payer: Medicaid Other

## 2013-09-18 LAB — CBC
HCT: 29.3 % — ABNORMAL LOW (ref 36.0–46.0)
MCH: 27.2 pg (ref 26.0–34.0)
Platelets: 428 10*3/uL — ABNORMAL HIGH (ref 150–400)
RBC: 3.49 MIL/uL — ABNORMAL LOW (ref 3.87–5.11)
RDW: 13.9 % (ref 11.5–15.5)
WBC: 11.9 10*3/uL — ABNORMAL HIGH (ref 4.0–10.5)

## 2013-09-18 LAB — BASIC METABOLIC PANEL
CO2: 21 mEq/L (ref 19–32)
Chloride: 100 mEq/L (ref 96–112)
GFR calc non Af Amer: 18 mL/min — ABNORMAL LOW (ref >90)
Potassium: 4.4 mEq/L (ref 3.5–5.1)
Sodium: 133 mEq/L — ABNORMAL LOW (ref 135–145)

## 2013-09-18 LAB — GLUCOSE, CAPILLARY: Glucose-Capillary: 228 mg/dL — ABNORMAL HIGH (ref 70–99)

## 2013-09-18 LAB — PRO B NATRIURETIC PEPTIDE: Pro B Natriuretic peptide (BNP): 11984 pg/mL — ABNORMAL HIGH (ref 0–125)

## 2013-09-18 MED ORDER — REGADENOSON 0.4 MG/5ML IV SOLN
0.4000 mg | Freq: Once | INTRAVENOUS | Status: AC
  Administered 2013-09-18: 0.4 mg via INTRAVENOUS
  Filled 2013-09-18: qty 5

## 2013-09-18 MED ORDER — TECHNETIUM TC 99M SESTAMIBI - CARDIOLITE
20.0000 | Freq: Once | INTRAVENOUS | Status: AC | PRN
  Administered 2013-09-18: 30 via INTRAVENOUS

## 2013-09-18 MED ORDER — SODIUM CHLORIDE 0.9 % IJ SOLN
10.0000 mL | INTRAMUSCULAR | Status: DC | PRN
  Administered 2013-09-18 – 2013-09-20 (×5): 20 mL
  Administered 2013-09-21 – 2013-09-22 (×2): 30 mL

## 2013-09-18 MED ORDER — PREDNISONE 20 MG PO TABS
40.0000 mg | ORAL_TABLET | Freq: Every day | ORAL | Status: DC
  Administered 2013-09-19: 11:00:00 40 mg via ORAL
  Filled 2013-09-18 (×2): qty 2

## 2013-09-18 MED ORDER — HYDRALAZINE HCL 25 MG PO TABS
37.5000 mg | ORAL_TABLET | Freq: Three times a day (TID) | ORAL | Status: DC
  Administered 2013-09-18 – 2013-09-22 (×11): 37.5 mg via ORAL
  Filled 2013-09-18 (×16): qty 1.5

## 2013-09-18 MED ORDER — ALBUTEROL SULFATE (5 MG/ML) 0.5% IN NEBU
2.5000 mg | INHALATION_SOLUTION | Freq: Two times a day (BID) | RESPIRATORY_TRACT | Status: DC
  Administered 2013-09-18 – 2013-09-21 (×6): 2.5 mg via RESPIRATORY_TRACT
  Filled 2013-09-18 (×5): qty 0.5

## 2013-09-18 MED ORDER — REGADENOSON 0.4 MG/5ML IV SOLN
INTRAVENOUS | Status: AC
  Filled 2013-09-18: qty 5

## 2013-09-18 MED ORDER — IPRATROPIUM BROMIDE 0.02 % IN SOLN
0.5000 mg | Freq: Two times a day (BID) | RESPIRATORY_TRACT | Status: DC
  Administered 2013-09-18 – 2013-09-21 (×6): 0.5 mg via RESPIRATORY_TRACT
  Filled 2013-09-18 (×5): qty 2.5

## 2013-09-18 NOTE — Progress Notes (Signed)
  [facility] Progress Note    Subjective: Breathing a little better, filling up "hat" but not a lot of urine recorded. Wt down 2kg   Exam  Blood pressure 168/90, pulse 109, temperature 97.6 F (36.4 C), temperature source Oral, resp. rate 20, height 5\' 8"  (1.727 m), weight 141.25 kg (311 lb 6.4 oz), last menstrual period 09/08/2013, SpO2 96.00%. gen: alert, no distress neck: + jvd, R ij cath in place  chest: bilateral rales 1/3 up the post lung fields  cor: regular, no gallop or rub, no M, pedal pulses intact  abd: soft, obese, nontender, no HSM  ext: 2+ edema bilat LE's neuro: alert, ox3, nonfocal   CXR > LLL infiltrate  EF 35-40% by ECHO UA  >300 proteinuria and 3-6 rbc/hpf Renal US > 10.9/11.1 cm kidneys, no hydro  Assessment/Plan:  1. CKD stage IV: pt was seeing a nephrologist in [place] before moving here. Prob due to DM and HTN. She has had DM for 23 years and HTN for about 10. eGFR 20 mL/min. Save L arm 2. Volume overload / pulm edema: continue IV lasix 120 q 8 3. 2HPTH: check phos and PTH, corr Ca 10.1 4. Anemia / CKD: Hb 9.9, check fe stores, consider esa 5. DM2- onset age 62 , 23 yrs duration, hx retinopathy 6. Cardiomyopathy, EF 35-40%: cardiology following, WMA by echo   Vinson Moselle MD  pager [phone]    cell [phone]  09/18/2013, 12:51 PM   Recent Labs Lab 09/16/13 0608 09/17/13 0850 09/18/13 0422  NA 127* 130* 133*  K 4.2 4.3 4.4  CL 96 99 100  CO2 17* 18* 21  GLUCOSE 392* 321* 248*  BUN 37* 43* 49*  CREATININE 2.59* 2.93* 3.10*  CALCIUM 8.1* 8.1* 7.8*    Recent Labs Lab 09/15/13 1244  AST 20  ALT 13  ALKPHOS 76  BILITOT 0.2*  PROT 6.1  ALBUMIN 1.4*    Recent Labs Lab 09/15/13 1244 09/15/13 2020 09/17/13 0850 09/18/13 0422  WBC 4.0 7.7 13.0* 11.9*  NEUTROABS 2.0  --   --   --   HGB 9.9* 9.0* 9.9* 9.5*  HCT 30.0* 26.8* 30.5* 29.3*  MCV 82.4 82.0 83.1 84.0  PLT 312 263 374 428*   . ipratropium  0.5 mg Nebulization  TID   And  . albuterol  2.5 mg Nebulization TID  . albuterol  5 mg Nebulization Once  . atorvastatin  40 mg Oral Daily  . azithromycin  500 mg Oral Daily  . budesonide (PULMICORT) nebulizer solution  0.25 mg Nebulization BID  . cefTRIAXone (ROCEPHIN)  IV  1 g Intravenous Q24H  . chlorpheniramine-HYDROcodone  5 mL Oral Q12H  . enoxaparin (LOVENOX) injection  40 mg Subcutaneous Q24H  . furosemide  120 mg Intravenous Q8H  . hydrALAZINE  37.5 mg Oral Q8H  . insulin aspart  0-20 Units Subcutaneous TID WC  . insulin aspart  0-5 Units Subcutaneous QHS  . insulin aspart  10 Units Subcutaneous TID WC  . insulin glargine  20 Units Subcutaneous QHS  . isosorbide dinitrate  20 mg Oral TID  . metoprolol tartrate  12.5 mg Oral BID  . [START ON 09/19/2013] predniSONE  40 mg Oral Q breakfast  . regadenoson         acetaminophen, albuterol, ondansetron (ZOFRAN) IV, sodium chloride

## 2013-09-18 NOTE — Progress Notes (Signed)
Subjective:  Denies SOB but I have bnot seen out of bed yet.  Objective:  Vital Signs in the last 24 hours: Temp:  [97.6 F (36.4 C)-97.8 F (36.6 C)] 97.6 F (36.4 C) (12/21 0514) Pulse Rate:  [95-105] 99 (12/21 0641) Resp:  [18] 18 (12/21 0514) BP: (122-158)/(77-102) 141/92 mmHg (12/21 0641) SpO2:  [94 %-96 %] 96 % (12/21 0514)  Intake/Output from previous day:  Intake/Output Summary (Last 24 hours) at 09/18/13 0757 Last data filed at 09/17/13 2137  Gross per 24 hour  Intake      0 ml  Output    900 ml  Net   -900 ml    Physical Exam: General appearance: alert, cooperative, no distress and morbidly obese Lungs: bilat rales 1/3 up Heart: regular rate and rhythm   Rate: 98  Rhythm: normal sinus rhythm  Lab Results:  Recent Labs  09/17/13 0850 09/18/13 0422  WBC 13.0* 11.9*  HGB 9.9* 9.5*  PLT 374 428*    Recent Labs  09/17/13 0850 09/18/13 0422  NA 130* 133*  K 4.3 4.4  CL 99 100  CO2 18* 21  GLUCOSE 321* 248*  BUN 43* 49*  CREATININE 2.93* 3.10*   No results found for this basename: TROPONINI, CK, MB,  in the last 72 hours No results found for this basename: INR,  in the last 72 hours  Imaging: Imaging results have been reviewed  Cardiac Studies:  Assessment/Plan:   Principal Problem:   Community acquired pneumonia Active Problems:   Asthma exacerbation   Cardiomyopathy- etiol undetermined- EF 35-40%   Diabetes mellitus- uncontrolled   Acute renal insuficency   Dehydration   Hypertension   Nausea and vomiting   Normocytic anemia   Hyperlipidemia   Morbid obesity- BMI 48    PLAN: 2 day protocol Myoview. Diuretics started.   Corine Shelter PA-C Beeper [phone] 09/18/2013, 7:57 AM   I have seen and examined the patient along with Corine Shelter PA-C.  I have reviewed the chart, notes and new data.  I agree with PA's note.  Key new complaints: no dyspnea at rest, not wearing O2 anymore Key examination changes: gross hypervolemia Key  new findings / data: creat marginally worse, BNP higher  PLAN: If renal function deteriorates with diuretics, best option is ultrafiltration. Threshold for coronary angio is high due to risk of progression to ESRD. However, this will be necessary if there are large perfusion abnormalities, whether or not they appear to be reversible by SPECT. If HD is started, she should have a LHC/coronary angio.  Thurmon Fair, MD, [facility] [phone] 09/18/2013, 11:11 AM

## 2013-09-18 NOTE — Progress Notes (Signed)
PATIENT DETAILS Name: [name] Age: 32 y.o. Sex: female Date of Birth: 1980/11/16 Admit Date: 09/15/2013 Admitting Physician Maryruth Bun Rama, MD PCP:No PCP Per Patient  Subjective: No major complaints-breathing much better  Assessment/Plan: Principal Problem:   Community acquired pneumonia - Afebrile, no leukocytosis. Improved clinically compared to admission - Continue with Rocephin and Zithromax-day 4 - Blood cultures 12/18- negative - Sputum culture 12/19-negative - Influenza PCR negative - HIV negative - Urine strep pneumo antigen negative, Legionella antigen negative.  Active Problems: Acute hypoxic respiratory failure - Secondary to above and from acute asthmatic bronchitis/exacerbation and acute systolic heart failure - Continue oxygen via nasal cannula, continue scheduled nebulized bronchodilators,intravenous steroids. Continue IV Levaquin and IV Lasix - Taper off oxygen as tolerated  Asthma with exacerbation - Continue nebulized bronchodilators,change solumedrol to prednisone,antibiotics as above. - Add incentive spirometry and flutter valve.  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 Suspect patient to have chronic kidney disease stage IV. -renal ultrasound on 12/20-no evidence of hydronephrosis  -spot urine protein/creatinine ratio of 0.5 , SPEP and UPEP pending -HIV negative - Nephrology evaluation appreciated  Diabetes - CBGs reviewed, sugars uncontrolled likely secondary to steroids. Hemoglobin A1c 10.0 - c/w Lantus 20 units each  bedtime, c/w NovoLog 10 units 3 times a day. Continue with SSI. We'll continue to adjust Lantus/NovoLog depending on CBG readings.  Hypertension - Controlled with IV lasix,Hydralazine/Imdur/Metoprolol -c/w Metoprolol - We'll continue to monitor blood pressure trend and adjust accordingly.  Nausea with vomiting - Resolved, continue with supportive care.  Anemia -suspect secondary to CKD -await anemia w/u  Dyslipidemia - Continue with statins  Disposition: Remain inpatient  DVT Prophylaxis: Prophylactic Lovenox   Code Status: Full code   Family Communication None at bedside  Procedures:  None  CONSULTS:  None  MEDICATIONS: Scheduled Meds: . ipratropium  0.5 mg Nebulization TID   And  . albuterol  2.5 mg Nebulization TID  . albuterol  5 mg Nebulization Once  . atorvastatin  40 mg Oral Daily  . azithromycin  500 mg Oral Daily  . budesonide (PULMICORT) nebulizer solution  0.25 mg Nebulization BID  . cefTRIAXone (ROCEPHIN)  IV  1 g Intravenous Q24H  . chlorpheniramine-HYDROcodone  5 mL Oral Q12H  . enoxaparin (LOVENOX) injection  40 mg Subcutaneous Q24H  . furosemide  120 mg Intravenous Q8H  . hydrALAZINE  25 mg Oral Q8H  . insulin aspart  0-20 Units Subcutaneous TID WC  . insulin aspart  0-5 Units Subcutaneous QHS  . insulin aspart  10 Units Subcutaneous TID WC  . insulin glargine  20 Units Subcutaneous QHS  . isosorbide dinitrate  20 mg Oral TID  . metoprolol tartrate  12.5 mg Oral BID  . [START ON 09/19/2013] predniSONE  40 mg Oral Q breakfast   Continuous Infusions:   PRN Meds:.acetaminophen, albuterol, ondansetron (ZOFRAN) IV, sodium chloride  Antibiotics: Anti-infectives   Start     Dose/Rate Route Frequency Ordered Stop   09/16/13 1000  azithromycin (ZITHROMAX) tablet 500 mg     500 mg Oral Daily 09/16/13 0946 09/23/13 0959   09/16/13 0600  cefTRIAXone (ROCEPHIN) 1 g in dextrose  5 % 50 mL IVPB     1 g 100 mL/hr over 30 Minutes Intravenous Every 24  hours 09/15/13 2000 09/23/13 0559   09/15/13 1815  cefTRIAXone (ROCEPHIN) 1 g in dextrose 5 % 50 mL IVPB  Status:  Discontinued     1 g 100 mL/hr over 30 Minutes Intravenous Every 24 hours 09/15/13 1813 09/15/13 1959   09/15/13 1815  azithromycin (ZITHROMAX) 500 mg in dextrose 5 % 250 mL IVPB  Status:  Discontinued     500 mg 250 mL/hr over 60 Minutes Intravenous Every 24 hours 09/15/13 1813 09/16/13 0945   09/15/13 1645  cefTRIAXone (ROCEPHIN) 1 g in dextrose 5 % 50 mL IVPB     1 g 100 mL/hr over 30 Minutes Intravenous  Once 09/15/13 1642 09/15/13 1844   09/15/13 1645  azithromycin (ZITHROMAX) 500 mg in dextrose 5 % 250 mL IVPB  Status:  Discontinued     500 mg 250 mL/hr over 60 Minutes Intravenous  Once 09/15/13 1642 09/16/13 1020       PHYSICAL EXAM: Vital signs in last 24 hours: Filed Vitals:   09/17/13 2137 09/18/13 0514 09/18/13 0641 09/18/13 0810  BP: 122/77 158/102 141/92   Pulse: 105 98 99   Temp: 97.6 F (36.4 C) 97.6 F (36.4 C)    TempSrc: Oral Oral    Resp: 18 18    Height:      Weight:    141.25 kg (311 lb 6.4 oz)  SpO2: 94% 96%      Weight change:  Filed Weights   09/15/13 1210 09/15/13 2003 09/18/13 0810  Weight: 142.747 kg (314 lb 11.2 oz) 143.4 kg (316 lb 2.2 oz) 141.25 kg (311 lb 6.4 oz)   Body mass index is 47.36 kg/(m^2).   Gen Exam: Awake and alert with clear speech.  Not in acute distress. Neck: Supple, No JVD.  Chest: Good air entry bilaterally, bibasilar rales CVS: S1 S2 Regular, no murmurs.  Abdomen: soft, BS +, non tender, non distended.  Extremities: 2+ edema, lower extremities warm to touch. Neurologic: Non Focal.   Skin: No Rash.   Wounds: N/A.   Intake/Output from previous day:  Intake/Output Summary (Last 24 hours) at 09/18/13 0927 Last data filed at 09/17/13 2137  Gross per 24 hour  Intake      0 ml  Output    900 ml  Net   -900 ml     LAB RESULTS: CBC  Recent Labs Lab 09/15/13 1244 09/15/13 2020 09/17/13 0850  09/18/13 0422  WBC 4.0 7.7 13.0* 11.9*  HGB 9.9* 9.0* 9.9* 9.5*  HCT 30.0* 26.8* 30.5* 29.3*  PLT 312 263 374 428*  MCV 82.4 82.0 83.1 84.0  MCH 27.2 27.5 27.0 27.2  MCHC 33.0 33.6 32.5 32.4  RDW 13.6 13.6 13.6 13.9  LYMPHSABS 1.8  --   --   --   MONOABS 0.3  --   --   --   EOSABS 0.0  --   --   --   BASOSABS 0.0  --   --   --     Chemistries   Recent Labs Lab 09/15/13 1244 09/15/13 2020 09/16/13 0608 09/17/13 0850 09/18/13 0422  NA 131*  --  127* 130* 133*  K 3.6  --  4.2 4.3 4.4  CL 99  --  96 99 100  CO2 21  --  17* 18* 21  GLUCOSE 184*  --  392* 321* 248*  BUN 39*  --  37* 43* 49*  CREATININE 2.72* 2.66* 2.59* 2.93* 3.10*  CALCIUM 8.1*  --  8.1* 8.1* 7.8*    CBG:  Recent Labs Lab 09/17/13 0747 09/17/13 1205 09/17/13 1726 09/17/13 2139 09/18/13 0811  GLUCAP 255* 245* 181* 169* 276*    GFR Estimated Creatinine Clearance: 38.3 ml/min (by C-G formula based on Cr of 3.1).  Coagulation profile No results found for this basename: INR, PROTIME,  in the last 168 hours  Cardiac Enzymes No results found for this basename: CK, CKMB, TROPONINI, MYOGLOBIN,  in the last 168 hours  No components found with this basename: POCBNP,  No results found for this basename: DDIMER,  in the last 72 hours  Recent Labs  09/15/13 1244  HGBA1C 10.0*   No results found for this basename: CHOL, HDL, LDLCALC, TRIG, CHOLHDL, LDLDIRECT,  in the last 72 hours No results found for this basename: TSH, T4TOTAL, FREET3, T3FREE, THYROIDAB,  in the last 72 hours No results found for this basename: VITAMINB12, FOLATE, FERRITIN, TIBC, IRON, RETICCTPCT,  in the last 72 hours No results found for this basename: LIPASE, AMYLASE,  in the last 72 hours  Urine Studies No results found for this basename: UACOL, UAPR, USPG, UPH, UTP, UGL, UKET, UBIL, UHGB, UNIT, UROB, ULEU, UEPI, UWBC, URBC, UBAC, CAST, CRYS, UCOM, BILUA,  in the last 72 hours  MICROBIOLOGY: Recent Results (from the past 240  hour(s))  CULTURE, BLOOD (ROUTINE X 2)     Status: None   Collection Time    09/15/13  8:20 PM      Result Value Range Status   Specimen Description BLOOD ARM LEFT   Final   Special Requests BOTTLES DRAWN AEROBIC ONLY 5CC   Final   Culture  Setup Time     Final   Value: 09/16/2013 02:08     Performed at [facility]   Culture     Final   Value:        BLOOD CULTURE RECEIVED NO GROWTH TO DATE CULTURE WILL BE HELD FOR 5 DAYS BEFORE ISSUING A FINAL NEGATIVE REPORT     Performed at [facility]   Report Status PENDING   Incomplete  CULTURE, BLOOD (ROUTINE X 2)     Status: None   Collection Time    09/15/13  8:30 PM      Result Value Range Status   Specimen Description BLOOD ARM LEFT   Final   Special Requests BOTTLES DRAWN AEROBIC ONLY 2CC   Final   Culture  Setup Time     Final   Value: 09/16/2013 02:08     Performed at [facility]   Culture     Final   Value:        BLOOD CULTURE RECEIVED NO GROWTH TO DATE CULTURE WILL BE HELD FOR 5 DAYS BEFORE ISSUING A FINAL NEGATIVE REPORT     Performed at [facility]   Report Status PENDING   Incomplete  CULTURE, EXPECTORATED SPUTUM-ASSESSMENT     Status: None   Collection Time    09/15/13 11:06 PM      Result Value Range Status   Specimen Description SPUTUM   Final   Special Requests NONE   Final   Sputum evaluation     Final   Value: MICROSCOPIC FINDINGS SUGGEST THAT THIS SPECIMEN IS NOT REPRESENTATIVE OF LOWER RESPIRATORY SECRETIONS. PLEASE RECOLLECT.     CALLED TO A.THOMPSON,RN 0020 09/16/13 M.CAMPBELL   Report Status 09/16/2013 FINAL  Final  CULTURE, EXPECTORATED SPUTUM-ASSESSMENT     Status: None   Collection Time    09/16/13 10:20 PM      Result Value Range Status   Specimen Description SPUTUM   Final   Special Requests NONE   Final   Sputum evaluation     Final   Value: THIS SPECIMEN IS ACCEPTABLE. RESPIRATORY CULTURE REPORT TO FOLLOW.   Report Status 09/17/2013 FINAL   Final  CULTURE,  RESPIRATORY (NON-EXPECTORATED)     Status: None   Collection Time    09/16/13 10:20 PM      Result Value Range Status   Specimen Description SPUTUM   Final   Special Requests NONE   Final   Gram Stain     Final   Value: MODERATE WBC PRESENT,BOTH PMN AND MONONUCLEAR     NO SQUAMOUS EPITHELIAL CELLS SEEN     NO ORGANISMS SEEN     Performed at [facility]   Culture PENDING   Incomplete   Report Status PENDING   Incomplete    RADIOLOGY STUDIES/RESULTS: Dg Chest 2 View  09/15/2013   CLINICAL DATA:  Cough for 2-1/2 weeks with shortness of breath and fever at times.  EXAM: CHEST  2 VIEW  COMPARISON:  09/16/2013  FINDINGS: Lungs are adequately inflated with bilateral patchy airspace opacification most prominent over the left base with mild overall worsening. Findings are likely due to infection. No evidence of effusion. Mild stable cardiomegaly. Remainder of the exam is unchanged.  IMPRESSION: Mild worsening of a patchy bilateral airspace process worse over the left base likely due to infection.  Mild stable cardiomegaly.   Electronically Signed   By: Elberta Fortis M.D.   On: 09/15/2013 15:58   Dg Chest 2 View  09/06/2013   CLINICAL DATA:  Cough, fever.  EXAM: CHEST  2 VIEW  COMPARISON:  None.  FINDINGS: The heart size and mediastinal contours are within normal limits. No pneumothorax or pleural effusion is noted. Bilateral peribronchial thickening is noted concerning for bronchitis or possibly perihilar pneumonitis. The visualized skeletal structures are unremarkable.  IMPRESSION: Findings consistent with either bilateral bronchitis or perihilar pneumonitis.   Electronically Signed   By: Roque Lias M.D.   On: 09/06/2013 11:07    Jeoffrey Massed, MD  [facility] Pager:[phone]  If 7PM-7AM, please contact night-coverage [email or web address] Password [facility] 09/18/2013, 9:27 AM   LOS: 3 days

## 2013-09-19 ENCOUNTER — Inpatient Hospital Stay (HOSPITAL_COMMUNITY): Payer: Medicaid Other

## 2013-09-19 DIAGNOSIS — R079 Chest pain, unspecified: Secondary | ICD-10-CM

## 2013-09-19 DIAGNOSIS — I5043 Acute on chronic combined systolic (congestive) and diastolic (congestive) heart failure: Secondary | ICD-10-CM

## 2013-09-19 DIAGNOSIS — I428 Other cardiomyopathies: Secondary | ICD-10-CM

## 2013-09-19 LAB — RENAL FUNCTION PANEL
Albumin: 1.5 g/dL — ABNORMAL LOW (ref 3.5–5.2)
BUN: 47 mg/dL — ABNORMAL HIGH (ref 6–23)
CO2: 23 mEq/L (ref 19–32)
Calcium: 8.3 mg/dL — ABNORMAL LOW (ref 8.4–10.5)
Creatinine, Ser: 2.86 mg/dL — ABNORMAL HIGH (ref 0.50–1.10)
Glucose, Bld: 177 mg/dL — ABNORMAL HIGH (ref 70–99)
Phosphorus: 5.2 mg/dL — ABNORMAL HIGH (ref 2.3–4.6)
Potassium: 3.6 mEq/L (ref 3.5–5.1)

## 2013-09-19 LAB — GLUCOSE, CAPILLARY
Glucose-Capillary: 153 mg/dL — ABNORMAL HIGH (ref 70–99)
Glucose-Capillary: 109 mg/dL — ABNORMAL HIGH (ref 70–99)
Glucose-Capillary: 289 mg/dL — ABNORMAL HIGH (ref 70–99)
Glucose-Capillary: 318 mg/dL — ABNORMAL HIGH (ref 70–99)

## 2013-09-19 LAB — HEPATITIS PANEL, ACUTE
HCV Ab: NEGATIVE
Hep A IgM: NONREACTIVE
Hep B C IgM: NONREACTIVE

## 2013-09-19 LAB — IRON AND TIBC
Iron: 64 ug/dL (ref 42–135)
Saturation Ratios: 37 % (ref 20–55)
TIBC: 172 ug/dL — ABNORMAL LOW (ref 250–470)
UIBC: 108 ug/dL — ABNORMAL LOW (ref 125–400)

## 2013-09-19 LAB — KAPPA/LAMBDA LIGHT CHAINS
Kappa free light chain: 3.8 mg/dL — ABNORMAL HIGH (ref 0.33–1.94)
Lambda free light chains: 3.19 mg/dL — ABNORMAL HIGH (ref 0.57–2.63)

## 2013-09-19 LAB — CULTURE, RESPIRATORY W GRAM STAIN: Culture: NORMAL

## 2013-09-19 LAB — CULTURE, RESPIRATORY

## 2013-09-19 LAB — ANA: Anti Nuclear Antibody(ANA): NEGATIVE

## 2013-09-19 LAB — FERRITIN: Ferritin: 630 ng/mL — ABNORMAL HIGH (ref 10–291)

## 2013-09-19 LAB — PARATHYROID HORMONE, INTACT (NO CA): PTH: 172.7 pg/mL — ABNORMAL HIGH (ref 14.0–72.0)

## 2013-09-19 MED ORDER — TECHNETIUM TC 99M SESTAMIBI GENERIC - CARDIOLITE
30.0000 | Freq: Once | INTRAVENOUS | Status: AC | PRN
  Administered 2013-09-19: 30 via INTRAVENOUS

## 2013-09-19 MED ORDER — POTASSIUM CHLORIDE 20 MEQ/15ML (10%) PO LIQD
20.0000 meq | Freq: Two times a day (BID) | ORAL | Status: DC
  Administered 2013-09-19 – 2013-09-21 (×5): 20 meq via ORAL
  Filled 2013-09-19 (×8): qty 15

## 2013-09-19 MED ORDER — PREDNISONE 20 MG PO TABS
30.0000 mg | ORAL_TABLET | Freq: Every day | ORAL | Status: DC
  Administered 2013-09-20 – 2013-09-22 (×3): 30 mg via ORAL
  Filled 2013-09-19 (×4): qty 1

## 2013-09-19 MED ORDER — DARBEPOETIN ALFA-POLYSORBATE 100 MCG/0.5ML IJ SOLN
100.0000 ug | INTRAMUSCULAR | Status: DC
  Filled 2013-09-19: qty 0.5

## 2013-09-19 MED ORDER — TECHNETIUM TC 99M SESTAMIBI GENERIC - CARDIOLITE
30.0000 | Freq: Once | INTRAVENOUS | Status: AC | PRN
  Administered 2013-09-19: 14:00:00 30 via INTRAVENOUS

## 2013-09-19 NOTE — Progress Notes (Signed)
PATIENT DETAILS Name: [name] Age: 32 y.o. Sex: female Date of Birth: [birth year] Admit Date: 09/15/2013 Admitting Physician Maryruth Bun Rama, MD PCP:No PCP Per Patient  Subjective: No major complaints, less edema in legs. No SOB  Assessment/Plan: Principal Problem:   Community acquired pneumonia - Afebrile, no leukocytosis. Improved significantly - Continue with Rocephin and Zithromax-day 5-will stop after today's dose - Blood cultures 12/18- negative - Sputum culture 12/19-negative - Influenza PCR negative - HIV negative - Urine strep pneumo antigen negative, Legionella antigen negative.  Active Problems: Acute hypoxic respiratory failure - Secondary to above and from acute asthmatic bronchitis/exacerbation and acute systolic heart failure - Continue oxygen via nasal cannula, continue scheduled nebulized bronchodilators,intravenous steroids. Continue IV Levaquin and IV Lasix - Taper off oxygen as tolerated  Asthma with exacerbation - Continue nebulized bronchodilators,initially on solumedrol now transitioned to prednisone-will taper,antibiotics as above. - Add incentive spirometry and flutter valve.  Acute Systolic CHF -On IV Lasix-per Nephrology. Significant improvement. 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 Hemoglobin A1c 10.0 - c/w Lantus 20 units each bedtime, c/w NovoLog 10 units 3 times a day. Continue with SSI. We'll continue to adjust Lantus/NovoLog depending on CBG readings.  Hypertension - Controlled with IV lasix,Hydralazine/Imdur/Metoprolol -c/w Metoprolol - We'll continue to monitor blood pressure trend and adjust accordingly.  Nausea with vomiting - Resolved, continue with supportive care.  Anemia -suspect secondary to CKD -await anemia w/u  Dyslipidemia - Continue with statins  Disposition: Remain inpatient-home in 1-2 days  DVT Prophylaxis: Prophylactic Lovenox   Code Status: Full code   Family Communication None at bedside  Procedures:  None  CONSULTS:  None  MEDICATIONS: Scheduled Meds: . albuterol  2.5 mg Nebulization BID  . albuterol  5 mg Nebulization Once  . atorvastatin  40 mg Oral Daily  . azithromycin  500 mg Oral Daily  . budesonide (PULMICORT) nebulizer solution  0.25 mg Nebulization BID  . cefTRIAXone (ROCEPHIN)  IV  1 g Intravenous Q24H  . chlorpheniramine-HYDROcodone  5 mL Oral Q12H  . enoxaparin (LOVENOX) injection  40 mg Subcutaneous Q24H  . furosemide  120 mg Intravenous Q8H  . hydrALAZINE  37.5 mg Oral Q8H  . insulin aspart  0-20 Units Subcutaneous TID WC  . insulin aspart  0-5 Units Subcutaneous QHS  . insulin aspart  10 Units Subcutaneous TID WC  . insulin glargine  20 Units Subcutaneous QHS  . ipratropium  0.5 mg Nebulization BID  . isosorbide dinitrate  20 mg Oral TID  . metoprolol tartrate  12.5 mg Oral BID  . predniSONE  40 mg Oral Q breakfast   Continuous Infusions:   PRN Meds:.acetaminophen, albuterol, ondansetron (ZOFRAN) IV, sodium chloride  Antibiotics: Anti-infectives   Start     Dose/Rate Route Frequency Ordered Stop   09/16/13 1000  azithromycin (ZITHROMAX)  tablet 500  mg     500 mg Oral Daily 09/16/13 0946 09/23/13 0959   09/16/13 0600  cefTRIAXone (ROCEPHIN) 1 g in dextrose 5 % 50 mL IVPB     1 g 100 mL/hr over 30 Minutes Intravenous Every 24 hours 09/15/13 2000 09/23/13 0559   09/15/13 1815  cefTRIAXone (ROCEPHIN) 1 g in dextrose 5 % 50 mL IVPB  Status:  Discontinued     1 g 100 mL/hr over 30 Minutes Intravenous Every 24 hours 09/15/13 1813 09/15/13 1959   09/15/13 1815  azithromycin (ZITHROMAX) 500 mg in dextrose 5 % 250 mL IVPB  Status:  Discontinued     500 mg 250 mL/hr over 60 Minutes Intravenous Every 24 hours 09/15/13 1813 09/16/13 0945   09/15/13 1645  cefTRIAXone (ROCEPHIN) 1 g in dextrose 5 % 50 mL IVPB     1 g 100 mL/hr over 30 Minutes Intravenous  Once 09/15/13 1642 09/15/13 1844   09/15/13 1645  azithromycin (ZITHROMAX) 500 mg in dextrose 5 % 250 mL IVPB  Status:  Discontinued     500 mg 250 mL/hr over 60 Minutes Intravenous  Once 09/15/13 1642 09/16/13 1020       PHYSICAL EXAM: Vital signs in last 24 hours: Filed Vitals:   09/18/13 1515 09/18/13 2126 09/19/13 0512 09/19/13 0744  BP:  138/88 163/98   Pulse:  104 91 94  Temp:  97.5 F (36.4 C) 97.5 F (36.4 C)   TempSrc:  Oral Oral   Resp:  17 17 16   Height:      Weight:      SpO2: 95% 96% 92% 95%    Weight change:  Filed Weights   09/15/13 1210 09/15/13 2003 09/18/13 0810  Weight: 142.747 kg (314 lb 11.2 oz) 143.4 kg (316 lb 2.2 oz) 141.25 kg (311 lb 6.4 oz)   Body mass index is 47.36 kg/(m^2).   Gen Exam: Awake and alert with clear speech.  Not in acute distress. Neck: Supple, No JVD.  Chest: Good air entry bilaterally, clear lungs today CVS: S1 S2 Regular, no murmurs.  Abdomen: soft, BS +, non tender, non distended.  Extremities: 1+ edema, lower extremities warm to touch. Neurologic: Non Focal.   Skin: No Rash.   Wounds: N/A.   Intake/Output from previous day:  Intake/Output Summary (Last 24 hours) at 09/19/13 0852 Last data filed at 09/19/13 0539  Gross  per 24 hour  Intake    180 ml  Output   2200 ml  Net  -2020 ml     LAB RESULTS: CBC  Recent Labs Lab 09/15/13 1244 09/15/13 2020 09/17/13 0850 09/18/13 0422  WBC 4.0 7.7 13.0* 11.9*  HGB 9.9* 9.0* 9.9* 9.5*  HCT 30.0* 26.8* 30.5* 29.3*  PLT 312 263 374 428*  MCV 82.4 82.0 83.1 84.0  MCH 27.2 27.5 27.0 27.2  MCHC 33.0 33.6 32.5 32.4  RDW 13.6 13.6 13.6 13.9  LYMPHSABS 1.8  --   --   --   MONOABS 0.3  --   --   --   EOSABS 0.0  --   --   --   BASOSABS 0.0  --   --   --     Chemistries   Recent Labs Lab 09/15/13 1244 09/15/13 2020 09/16/13 0608 09/17/13 0850 09/18/13 0422 09/19/13 0506  NA 131*  --  127* 130* 133* 133*  K 3.6  --  4.2 4.3 4.4 3.6  CL 99  --  96 99 100 99  CO2 21  --  17* 18* 21 23  GLUCOSE 184*  --  392* 321* 248* 177*  BUN 39*  --  37* 43* 49* 47*  CREATININE 2.72* 2.66* 2.59* 2.93* 3.10* 2.86*  CALCIUM 8.1*  --  8.1* 8.1* 7.8* 8.3*    CBG:  Recent Labs Lab 09/18/13 0811 09/18/13 1219 09/18/13 1735 09/18/13 2124 09/19/13 0757  GLUCAP 276* 228* 231* 219* 153*    GFR Estimated Creatinine Clearance: 41.5 ml/min (by C-G formula based on Cr of 2.86).  Coagulation profile No results found for this basename: INR, PROTIME,  in the last 168 hours  Cardiac Enzymes No results found for this basename: CK, CKMB, TROPONINI, MYOGLOBIN,  in the last 168 hours  No components found with this basename: POCBNP,  No results found for this basename: DDIMER,  in the last 72 hours No results found for this basename: HGBA1C,  in the last 72 hours No results found for this basename: CHOL, HDL, LDLCALC, TRIG, CHOLHDL, LDLDIRECT,  in the last 72 hours No results found for this basename: TSH, T4TOTAL, FREET3, T3FREE, THYROIDAB,  in the last 72 hours  Recent Labs  09/18/13 1525  FERRITIN 630*  TIBC 172*  IRON 64   No results found for this basename: LIPASE, AMYLASE,  in the last 72 hours  Urine Studies No results found for this basename: UACOL,  UAPR, USPG, UPH, UTP, UGL, UKET, UBIL, UHGB, UNIT, UROB, ULEU, UEPI, UWBC, URBC, UBAC, CAST, CRYS, UCOM, BILUA,  in the last 72 hours  MICROBIOLOGY: Recent Results (from the past 240 hour(s))  CULTURE, BLOOD (ROUTINE X 2)     Status: None   Collection Time    09/15/13  8:20 PM      Result Value Range Status   Specimen Description BLOOD ARM LEFT   Final   Special Requests BOTTLES DRAWN AEROBIC ONLY 5CC   Final   Culture  Setup Time     Final   Value: 09/16/2013 02:08     Performed at [facility]   Culture     Final   Value:        BLOOD CULTURE RECEIVED NO GROWTH TO DATE CULTURE WILL BE HELD FOR 5 DAYS BEFORE ISSUING A FINAL NEGATIVE REPORT     Performed at [facility]   Report Status PENDING   Incomplete  CULTURE, BLOOD (ROUTINE X 2)     Status: None   Collection Time    09/15/13  8:30 PM      Result Value Range Status   Specimen Description BLOOD ARM LEFT   Final   Special Requests BOTTLES DRAWN AEROBIC ONLY 2CC   Final   Culture  Setup Time     Final   Value: 09/16/2013 02:08     Performed at [facility]   Culture     Final   Value:        BLOOD CULTURE RECEIVED NO GROWTH TO DATE CULTURE WILL BE HELD FOR 5 DAYS BEFORE ISSUING A FINAL NEGATIVE REPORT     Performed at [facility]   Report Status PENDING   Incomplete  CULTURE, EXPECTORATED SPUTUM-ASSESSMENT     Status: None   Collection Time    09/15/13 11:06 PM      Result Value Range Status   Specimen Description SPUTUM   Final   Special Requests NONE   Final   Sputum evaluation     Final   Value: MICROSCOPIC FINDINGS SUGGEST THAT  THIS SPECIMEN IS NOT REPRESENTATIVE OF LOWER RESPIRATORY SECRETIONS. PLEASE RECOLLECT.     CALLED TO A.THOMPSON,RN 0020 09/16/13 M.CAMPBELL   Report Status 09/16/2013 FINAL   Final  CULTURE, EXPECTORATED SPUTUM-ASSESSMENT     Status: None   Collection Time    09/16/13 10:20 PM      Result Value Range Status   Specimen Description SPUTUM   Final   Special  Requests NONE   Final   Sputum evaluation     Final   Value: THIS SPECIMEN IS ACCEPTABLE. RESPIRATORY CULTURE REPORT TO FOLLOW.   Report Status 09/17/2013 FINAL   Final  CULTURE, RESPIRATORY (NON-EXPECTORATED)     Status: None   Collection Time    09/16/13 10:20 PM      Result Value Range Status   Specimen Description SPUTUM   Final   Special Requests NONE   Final   Gram Stain     Final   Value: MODERATE WBC PRESENT,BOTH PMN AND MONONUCLEAR     NO SQUAMOUS EPITHELIAL CELLS SEEN     NO ORGANISMS SEEN     Performed at [facility]   Culture     Final   Value: NORMAL OROPHARYNGEAL FLORA     Performed at [facility]   Report Status PENDING   Incomplete    RADIOLOGY STUDIES/RESULTS: Dg Chest 2 View  09/15/2013   CLINICAL DATA:  Cough for 2-1/2 weeks with shortness of breath and fever at times.  EXAM: CHEST  2 VIEW  COMPARISON:  09/16/2013  FINDINGS: Lungs are adequately inflated with bilateral patchy airspace opacification most prominent over the left base with mild overall worsening. Findings are likely due to infection. No evidence of effusion. Mild stable cardiomegaly. Remainder of the exam is unchanged.  IMPRESSION: Mild worsening of a patchy bilateral airspace process worse over the left base likely due to infection.  Mild stable cardiomegaly.   Electronically Signed   By: Elberta Fortis M.D.   On: 09/15/2013 15:58   Dg Chest 2 View  09/06/2013   CLINICAL DATA:  Cough, fever.  EXAM: CHEST  2 VIEW  COMPARISON:  None.  FINDINGS: The heart size and mediastinal contours are within normal limits. No pneumothorax or pleural effusion is noted. Bilateral peribronchial thickening is noted concerning for bronchitis or possibly perihilar pneumonitis. The visualized skeletal structures are unremarkable.  IMPRESSION: Findings consistent with either bilateral bronchitis or perihilar pneumonitis.   Electronically Signed   By: Roque Lias M.D.   On: 09/06/2013 11:07    Jeoffrey Massed,  MD  [facility] Pager:[phone]  If 7PM-7AM, please contact night-coverage [email or web address] Password [facility] 09/19/2013, 8:52 AM   LOS: 4 days

## 2013-09-19 NOTE — Care Management Note (Unsigned)
    Page 1 of 1   09/20/2013     3:56:33 PM   CARE MANAGEMENT NOTE 09/20/2013  Patient:  [name], [name]   Account Number:  [id number]  Date Initiated:  09/19/2013  Documentation initiated by:  [name]  Subjective/Objective Assessment:   dx CAP, renal failure, nephrotic syndrom, dm , copd, chf  admit- lives with children.     Action/Plan:   Anticipated DC Date:  09/23/2013   Anticipated DC Plan:  HOME/SELF CARE      DC Planning Services  CM consult  Follow-up appt scheduled      Choice offered to / List presented to:             Status of service:  In process, will continue to follow Medicare Important Message given?   (If response is "NO", the following Medicare IM given date fields will be blank) Date Medicare IM given:   Date Additional Medicare IM given:    Discharge Disposition:    Per UR Regulation:    If discussed at Long Length of Stay Meetings, dates discussed:   09/20/2013    Comments:  09/20/13 15:55 [name] RN, BSN [phone] patient with pulmonary edema, conts on iv lasix,  patient had positive stress test and she is for cath today.  09/19/13 12:12 [name] RN, BSN [phone] patient children lives with her at home, pta indep. Patient has medicaid card from [facility], [place]. NCM called [facility] , scheduled patient for an apt, patient will need to get her medicaid card switched from the [facility] in [place] to [facility].  [facility] will see this patient on 1/27 at 12.

## 2013-09-19 NOTE — Progress Notes (Signed)
[facility] Progress Note    Subjective: Breathing a little better, she continues to diurese well. Creatinine if anything is down a little bit, and she is not uremic    Exam  Blood pressure 163/98, pulse 94, temperature 97.5 F (36.4 C), temperature source Oral, resp. rate 16, height 5\' 8"  (1.727 m), weight 141.25 kg (311 lb 6.4 oz), last menstrual period 09/08/2013, SpO2 95.00%. gen: alert, no distress neck: + jvd, R ij line in place  chest: bilateral rales 1/3 up the post lung fields  cor: regular, no gallop or rub, no M, pedal pulses intact  abd: soft, obese, nontender, no HSM  ext: 2+ edema bilat LE's neuro: alert, ox3, nonfocal   CXR > LLL infiltrate  EF 35-40% by ECHO UA  >300 proteinuria and 3-6 rbc/hpf Renal US > 10.9/11.1 cm kidneys, no hydro  Assessment/Plan:  1. CKD stage IV: pt was seeing a nephrologist in [place] before moving here. Prob due to DM and HTN. She has had DM for 23 years and HTN for about 10. eGFR 20 mL/min. Save L arm. No immediate indications for dialysis. Likely we'll not pursue access  this hospitalization.   We'll arrange nephrology outpatient followup here at time of discharge . 2. Volume overload / pulm edema: continue IV lasix 120 q 8- diuresing well but not yet at dry weight. Blood pressure still up but could be some do to steroids. No changes to medicines today.  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 Anemia / CKD: Hb 9.9. Iron sat is good. I will give 1 dose of Aranesp 5. DM2- onset age 4 , 23 yrs duration, hx retinopathy 6. Cardiomyopathy, EF 35-40%: cardiology following, WMA by echo. Likely skewed in the setting of acute heart failure.   Zigmond Trela A   09/19/2013, 11:51 AM   Recent Labs Lab 09/17/13 0850 09/18/13 0422 09/18/13 1525 09/19/13 0506  NA 130* 133*  --  133*  K 4.3 4.4  --  3.6  CL 99 100  --  99  CO2 18* 21  --  23  GLUCOSE 321*  248*  --  177*  BUN 43* 49*  --  47*  CREATININE 2.93* 3.10*  --  2.86*  CALCIUM 8.1* 7.8*  --  8.3*  PHOS  --   --  5.2* 5.2*    Recent Labs Lab 09/15/13 1244 09/19/13 0506  AST 20  --   ALT 13  --   ALKPHOS 76  --   BILITOT 0.2*  --   PROT 6.1  --   ALBUMIN 1.4* 1.5*    Recent Labs Lab 09/15/13 1244 09/15/13 2020 09/17/13 0850 09/18/13 0422  WBC 4.0 7.7 13.0* 11.9*  NEUTROABS 2.0  --   --   --   HGB 9.9* 9.0* 9.9* 9.5*  HCT 30.0* 26.8* 30.5* 29.3*  MCV 82.4 82.0 83.1 84.0  PLT 312 263 374 428*   . albuterol  2.5 mg Nebulization BID  . albuterol  5 mg Nebulization Once  . atorvastatin  40 mg Oral Daily  . budesonide (PULMICORT) nebulizer solution  0.25 mg Nebulization BID  . chlorpheniramine-HYDROcodone  5 mL Oral Q12H  . enoxaparin (LOVENOX) injection  40 mg Subcutaneous Q24H  . furosemide  120 mg Intravenous Q8H  . hydrALAZINE  37.5 mg Oral Q8H  . insulin aspart  0-20 Units Subcutaneous TID WC  . insulin aspart  0-5 Units Subcutaneous QHS  . insulin aspart  10 Units Subcutaneous TID WC  . insulin glargine  20 Units Subcutaneous QHS  . ipratropium  0.5 mg Nebulization BID  . isosorbide dinitrate  20 mg Oral TID  . metoprolol tartrate  12.5 mg Oral BID  . predniSONE  40 mg Oral Q breakfast     acetaminophen, albuterol, ondansetron (ZOFRAN) IV, sodium chloride

## 2013-09-19 NOTE — Progress Notes (Signed)
Subjective:  No CP/SOB  Objective:  Temp:  [97.2 F (36.2 C)-97.5 F (36.4 C)] 97.2 F (36.2 C) (12/22 1442) Pulse Rate:  [91-104] 103 (12/22 1442) Resp:  [16-20] 20 (12/22 1442) BP: (127-163)/(83-98) 127/83 mmHg (12/22 1442) SpO2:  [92 %-96 %] 96 % (12/22 1442) Weight:  [305 lb 4.8 oz (138.483 kg)] 305 lb 4.8 oz (138.483 kg) (12/22 1442) Weight change:   Intake/Output from previous day: 12/21 0701 - 12/22 0700 In: 552 [P.O.:180; IV Piggyback:372] Out: 2200 [Urine:2200]  Intake/Output from this shift: Total I/O In: 120 [P.O.:120] Out: -   Physical Exam: General appearance: alert and no distress Neck: no adenopathy, no carotid bruit, no JVD, supple, symmetrical, trachea midline and thyroid not enlarged, symmetric, no tenderness/mass/nodules Lungs: Decreased BS at base bilaterally Heart: regular rate and rhythm, S1, S2 normal, no murmur, click, rub or gallop Extremities: extremities normal, atraumatic, no cyanosis or edema  Lab Results: Results for orders placed during the hospital encounter of 09/15/13 (from the past 48 hour(s))  GLUCOSE, CAPILLARY     Status: Abnormal   Collection Time    09/17/13  9:39 PM      Result Value Range   Glucose-Capillary 169 (*) 70 - 99 mg/dL   Comment 1 Notify RN    CBC     Status: Abnormal   Collection Time    09/18/13  4:22 AM      Result Value Range   WBC 11.9 (*) 4.0 - 10.5 K/uL   RBC 3.49 (*) 3.87 - 5.11 MIL/uL   Hemoglobin 9.5 (*) 12.0 - 15.0 g/dL   HCT 16.1 (*) 09.6 - 04.5 %   MCV 84.0  78.0 - 100.0 fL   MCH 27.2  26.0 - 34.0 pg   MCHC 32.4  30.0 - 36.0 g/dL   RDW 40.9  81.1 - 91.4 %   Platelets 428 (*) 150 - 400 K/uL  BASIC METABOLIC PANEL     Status: Abnormal   Collection Time    09/18/13  4:22 AM      Result Value Range   Sodium 133 (*) 135 - 145 mEq/L   Potassium 4.4  3.5 - 5.1 mEq/L   Chloride 100  96 - 112 mEq/L   CO2 21  19 - 32 mEq/L   Glucose, Bld 248 (*) 70 - 99 mg/dL   BUN 49 (*) 6 - 23 mg/dL   Creatinine, Ser 7.82 (*) 0.50 - 1.10 mg/dL   Calcium 7.8 (*) 8.4 - 10.5 mg/dL   GFR calc non Af Amer 18 (*) >90 mL/min   GFR calc Af Amer 21 (*) >90 mL/min   Comment: (NOTE)     The eGFR has been calculated using the CKD EPI equation.     This calculation has not been validated in all clinical situations.     eGFR's persistently <90 mL/min signify possible Chronic Kidney     Disease.  PRO B NATRIURETIC PEPTIDE     Status: Abnormal   Collection Time    09/18/13  4:22 AM      Result Value Range   Pro B Natriuretic peptide (BNP) 11984.0 (*) 0 - 125 pg/mL  GLUCOSE, CAPILLARY     Status: Abnormal   Collection Time    09/18/13  8:11 AM      Result Value Range   Glucose-Capillary 276 (*) 70 - 99 mg/dL  GLUCOSE, CAPILLARY     Status: Abnormal   Collection Time    09/18/13  12:19 PM      Result Value Range   Glucose-Capillary 228 (*) 70 - 99 mg/dL  PHOSPHORUS     Status: Abnormal   Collection Time    09/18/13  3:25 PM      Result Value Range   Phosphorus 5.2 (*) 2.3 - 4.6 mg/dL  IRON AND TIBC     Status: Abnormal   Collection Time    09/18/13  3:25 PM      Result Value Range   Iron 64  42 - 135 ug/dL   TIBC 161 (*) 096 - 045 ug/dL   Saturation Ratios 37  20 - 55 %   UIBC 108 (*) 125 - 400 ug/dL   Comment: Performed at [facility]  FERRITIN     Status: Abnormal   Collection Time    09/18/13  3:25 PM      Result Value Range   Ferritin 630 (*) 10 - 291 ng/mL   Comment: Performed at [facility]  KAPPA/LAMBDA LIGHT CHAINS     Status: Abnormal   Collection Time    09/18/13  3:25 PM      Result Value Range   Kappa free light chain 3.80 (*) 0.33 - 1.94 mg/dL   Lamda free light chains 3.19 (*) 0.57 - 2.63 mg/dL   Kappa, lamda light chain ratio 1.19  0.26 - 1.65   Comment: Performed at [facility]  HEPATITIS PANEL, ACUTE     Status: None   Collection Time    09/18/13  3:25 PM      Result Value Range   Hepatitis B Surface Ag NEGATIVE  NEGATIVE   HCV Ab  NEGATIVE  NEGATIVE   Hep A IgM NON REACTIVE  NON REACTIVE   Hep B C IgM NON REACTIVE  NON REACTIVE   Comment: (NOTE)     High levels of Hepatitis B Core IgM antibody are detectable     during the acute stage of Hepatitis B. This antibody is used     to differentiate current from past HBV infection.     Performed at [facility]  ANA     Status: None   Collection Time    09/18/13  3:25 PM      Result Value Range   ANA NEGATIVE  NEGATIVE   Comment: Performed at [facility]  PARATHYROID HORMONE, INTACT (NO CA)     Status: Abnormal   Collection Time    09/18/13  3:25 PM      Result Value Range   PTH 172.7 (*) 14.0 - 72.0 pg/mL   Comment: Performed at [facility]  GLUCOSE, CAPILLARY     Status: Abnormal   Collection Time    09/18/13  5:35 PM      Result Value Range   Glucose-Capillary 231 (*) 70 - 99 mg/dL  GLUCOSE, CAPILLARY     Status: Abnormal   Collection Time    09/18/13  9:24 PM      Result Value Range   Glucose-Capillary 219 (*) 70 - 99 mg/dL   Comment 1 Notify RN    RENAL FUNCTION PANEL     Status: Abnormal   Collection Time    09/19/13  5:06 AM      Result Value Range   Sodium 133 (*) 135 - 145 mEq/L   Potassium 3.6  3.5 - 5.1 mEq/L   Comment: DELTA CHECK NOTED   Chloride 99  96 - 112 mEq/L  CO2 23  19 - 32 mEq/L   Glucose, Bld 177 (*) 70 - 99 mg/dL   BUN 47 (*) 6 - 23 mg/dL   Creatinine, Ser 1.61 (*) 0.50 - 1.10 mg/dL   Calcium 8.3 (*) 8.4 - 10.5 mg/dL   Phosphorus 5.2 (*) 2.3 - 4.6 mg/dL   Albumin 1.5 (*) 3.5 - 5.2 g/dL   GFR calc non Af Amer 20 (*) >90 mL/min   GFR calc Af Amer 24 (*) >90 mL/min   Comment: (NOTE)     The eGFR has been calculated using the CKD EPI equation.     This calculation has not been validated in all clinical situations.     eGFR's persistently <90 mL/min signify possible Chronic Kidney     Disease.  GLUCOSE, CAPILLARY     Status: Abnormal   Collection Time    09/19/13  7:57 AM      Result Value Range    Glucose-Capillary 153 (*) 70 - 99 mg/dL  GLUCOSE, CAPILLARY     Status: Abnormal   Collection Time    09/19/13 11:38 AM      Result Value Range   Glucose-Capillary 109 (*) 70 - 99 mg/dL  GLUCOSE, CAPILLARY     Status: Abnormal   Collection Time    09/19/13  5:09 PM      Result Value Range   Glucose-Capillary 289 (*) 70 - 99 mg/dL    Imaging: Imaging results have been reviewed  Assessment/Plan:   1. Principal Problem: 2.   Community acquired pneumonia 3. Active Problems: 4.   Nausea and vomiting 5.   Diabetes mellitus- uncontrolled 6.   Normocytic anemia 7.   Acute renal insuficency 8.   Dehydration 9.   Hyperlipidemia 10.   Hypertension 11.   Asthma exacerbation 12.   Cardiomyopathy- etiol undetermined- EF 35-40% 13.   Morbid obesity- BMI 48 14.   Time Spent Directly with Patient:  20 minutes  Length of Stay:  LOS: 4 days  Type 1 diabetic with Moderately severe LV dysfunction and a high risk myoview. She has stage 4 CRI , nephrotic syndrome , and will require cardiac cath (radial). Will discuss with Nephrologists tomorrow.  Exam benign.  Runell Gess 09/19/2013, 6:31 PM

## 2013-09-20 ENCOUNTER — Encounter (HOSPITAL_COMMUNITY)
Admission: EM | Disposition: A | Discharge status: Home / Self Care | Payer: Self-pay | Source: Home / Self Care | Attending: Internal Medicine

## 2013-09-20 DIAGNOSIS — R9439 Abnormal result of other cardiovascular function study: Secondary | ICD-10-CM

## 2013-09-20 HISTORY — PX: CORONARY ANGIOGRAM: SHX5466

## 2013-09-20 LAB — RENAL FUNCTION PANEL
Albumin: 1.6 g/dL — ABNORMAL LOW (ref 3.5–5.2)
BUN: 51 mg/dL — ABNORMAL HIGH (ref 6–23)
Chloride: 101 mEq/L (ref 96–112)
Creatinine, Ser: 2.82 mg/dL — ABNORMAL HIGH (ref 0.50–1.10)
GFR calc Af Amer: 24 mL/min — ABNORMAL LOW (ref >90)
GFR calc non Af Amer: 21 mL/min — ABNORMAL LOW (ref >90)
Phosphorus: 5.5 mg/dL — ABNORMAL HIGH (ref 2.3–4.6)
Potassium: 4.4 mEq/L (ref 3.5–5.1)
Sodium: 135 mEq/L (ref 135–145)

## 2013-09-20 LAB — GLUCOSE, CAPILLARY

## 2013-09-20 LAB — PROTEIN ELECTROPHORESIS, SERUM
Alpha-1-Globulin: 20 % — ABNORMAL HIGH (ref 2.9–4.9)
Alpha-2-Globulin: 28.2 % — ABNORMAL HIGH (ref 7.1–11.8)
Beta 2: 6.4 % (ref 3.2–6.5)
Beta Globulin: 4.2 % — ABNORMAL LOW (ref 4.7–7.2)
Gamma Globulin: 9.6 % — ABNORMAL LOW (ref 11.1–18.8)
M-Spike, %: NOT DETECTED g/dL
Total Protein ELP: 5.2 g/dL — ABNORMAL LOW (ref 6.0–8.3)

## 2013-09-20 LAB — UIFE/LIGHT CHAINS/TP QN, 24-HR UR
Albumin, U: DETECTED
Alpha 1, Urine: DETECTED — AB
Beta, Urine: DETECTED — AB
Free Kappa Lt Chains,Ur: 28.3 mg/dL — ABNORMAL HIGH (ref 0.14–2.42)
Free Lambda Lt Chains,Ur: 7.7 mg/dL — ABNORMAL HIGH (ref 0.02–0.67)
Gamma Globulin, Urine: DETECTED — AB
Total Protein, Urine: >600 mg/dL

## 2013-09-20 LAB — PROTIME-INR
INR: 1.01 (ref 0.00–1.49)
Prothrombin Time: 13.1 seconds (ref 11.6–15.2)

## 2013-09-20 SURGERY — CORONARY ANGIOGRAM

## 2013-09-20 MED ORDER — HEPARIN SODIUM (PORCINE) 1000 UNIT/ML IJ SOLN
INTRAMUSCULAR | Status: AC
  Filled 2013-09-20: qty 1

## 2013-09-20 MED ORDER — SODIUM CHLORIDE 0.9 % IV SOLN: 250.0000 mL | INTRAVENOUS | Status: DC | PRN

## 2013-09-20 MED ORDER — SODIUM CHLORIDE 0.9 % IJ SOLN: 3.0000 mL | INTRAMUSCULAR | Status: DC | PRN

## 2013-09-20 MED ORDER — SODIUM CHLORIDE 0.9 % IV SOLN: INTRAVENOUS | Status: AC

## 2013-09-20 MED ORDER — MORPHINE SULFATE 2 MG/ML IJ SOLN: 1.0000 mg | INTRAMUSCULAR | Status: DC | PRN

## 2013-09-20 MED ORDER — LIVING WELL WITH DIABETES BOOK
Freq: Once | Status: AC
  Administered 2013-09-21: 05:00:00
  Filled 2013-09-20: qty 1

## 2013-09-20 MED ORDER — FENTANYL CITRATE 0.05 MG/ML IJ SOLN
INTRAMUSCULAR | Status: AC
  Filled 2013-09-20: qty 2

## 2013-09-20 MED ORDER — SODIUM CHLORIDE 0.9 % IJ SOLN: 3.0000 mL | Freq: Two times a day (BID) | INTRAMUSCULAR | Status: DC

## 2013-09-20 MED ORDER — NITROGLYCERIN 0.2 MG/ML ON CALL CATH LAB
INTRAVENOUS | Status: AC
  Filled 2013-09-20: qty 1

## 2013-09-20 MED ORDER — HEPARIN (PORCINE) IN NACL 2-0.9 UNIT/ML-% IJ SOLN
INTRAMUSCULAR | Status: AC
  Filled 2013-09-20: qty 1000

## 2013-09-20 MED ORDER — MIDAZOLAM HCL 2 MG/2ML IJ SOLN
INTRAMUSCULAR | Status: AC
  Filled 2013-09-20: qty 2

## 2013-09-20 MED ORDER — ONDANSETRON HCL 4 MG/2ML IJ SOLN: 4.0000 mg | Freq: Four times a day (QID) | INTRAMUSCULAR | Status: DC | PRN

## 2013-09-20 MED ORDER — ASPIRIN 81 MG PO CHEW
81.0000 mg | CHEWABLE_TABLET | Freq: Every day | ORAL | Status: DC
  Filled 2013-09-20 (×2): qty 1

## 2013-09-20 MED ORDER — ACETAMINOPHEN 325 MG PO TABS: 650.0000 mg | ORAL_TABLET | ORAL | Status: DC | PRN

## 2013-09-20 MED ORDER — METOPROLOL TARTRATE 50 MG PO TABS
50.0000 mg | ORAL_TABLET | Freq: Two times a day (BID) | ORAL | Status: DC
  Administered 2013-09-20: 21:00:00 50 mg via ORAL
  Filled 2013-09-20 (×3): qty 1

## 2013-09-20 MED ORDER — SODIUM CHLORIDE 0.9 % IV SOLN
INTRAVENOUS | Status: DC
  Administered 2013-09-20: 300 mL via INTRAVENOUS

## 2013-09-20 MED ORDER — VERAPAMIL HCL 2.5 MG/ML IV SOLN
INTRAVENOUS | Status: AC
  Filled 2013-09-20: qty 2

## 2013-09-20 MED ORDER — HYDRALAZINE HCL 20 MG/ML IJ SOLN: 10.0000 mg | INTRAMUSCULAR | Status: DC

## 2013-09-20 MED ORDER — LIDOCAINE HCL (PF) 1 % IJ SOLN
INTRAMUSCULAR | Status: AC
  Filled 2013-09-20: qty 30

## 2013-09-20 NOTE — Progress Notes (Signed)
[name] Progress Note    Subjective: Ended up needing a cardiac catheterization today for a positive stress test. I spoke with Dr. Gery Pray pre procedure and we agreed that he needed to be done despite the risks. Fortunately, only minimal disease was found and no intervention was needed and every precaution was taken. Patient is currently complaining of her hand after using a brachial approach but otherwise is without complaints. Precatheterization her creatinine was stable at around 2.8.   Exam  Blood pressure 170/103, pulse 110, temperature 97.7 F (36.5 C), temperature source Oral, resp. rate 18, height 5\' 8"  (1.727 m), weight 140.8 kg (310 lb 6.5 oz), last menstrual period 09/08/2013, SpO2 96.00%. gen: alert, no distress neck: + jvd, R ij line in place  chest: bilateral rales 1/3 up the post lung fields  cor: regular, no gallop or rub, no M, pedal pulses intact  abd: soft, obese, nontender, no HSM  ext: 2+ edema bilat LE's neuro: alert, ox3, nonfocal   CXR > LLL infiltrate  EF 35-40% by ECHO UA  >300 proteinuria and 3-6 rbc/hpf Renal US > 10.9/11.1 cm kidneys, no hydro  Assessment/Plan:  1. CKD stage IV: pt was seeing a nephrologist in [place] before moving here. Prob due to DM and HTN. She has had DM for 23 years and HTN for about 10. eGFR 20 mL/min. Save L arm. No immediate indications for dialysis. Likely we'll not pursue access  this hospitalization.   We'll arrange nephrology outpatient followup here at time of discharge. Hopefully there will not be significant insult given contrast exposure. We'll continue to monitor labs and urine output. 2. Volume overload / pulm edema: continue IV lasix 120 q 8- diuresing well but not yet at dry weight. Blood pressure still up but could be some do to steroids. Will increase metoprolol today.  Will continue  low dose potassium replacement. 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 Anemia / CKD: Hb 9.9. Iron sat is  good. Have given 1 dose of Aranesp 5. DM2- onset age 29 , 23 yrs duration, hx retinopathy 6. Cardiomyopathy, EF 35-40%: cardiology following, WMA by echo. Likely skewed in the setting of acute heart failure. Cardiac dysfunction likely secondary to long-standing and poorly controlled blood pressure. I stressed her the importance of good blood pressure control moving forward. This has not yet been achieved.   [name] A   09/20/2013, 1:24 PM   Recent Labs Lab 09/18/13 0422 09/18/13 1525 09/19/13 0506 09/20/13 0508  NA 133*  --  133* 135  K 4.4  --  3.6 4.4  CL 100  --  99 101  CO2 21  --  23 24  GLUCOSE 248*  --  177* 214*  BUN 49*  --  47* 51*  CREATININE 3.10*  --  2.86* 2.82*  CALCIUM 7.8*  --  8.3* 8.4  PHOS  --  5.2* 5.2* 5.5*    Recent Labs Lab 09/15/13 1244 09/19/13 0506 09/20/13 0508  AST 20  --   --   ALT 13  --   --   ALKPHOS 76  --   --   BILITOT 0.2*  --   --   PROT 6.1  --   --   ALBUMIN 1.4* 1.5* 1.6*    Recent Labs Lab 09/15/13 1244 09/15/13 2020 09/17/13 0850 09/18/13 0422  WBC 4.0 7.7 13.0* 11.9*  NEUTROABS 2.0  --   --   --  HGB 9.9* 9.0* 9.9* 9.5*  HCT 30.0* 26.8* 30.5* 29.3*  MCV 82.4 82.0 83.1 84.0  PLT 312 263 374 428*   . albuterol  2.5 mg Nebulization BID  . albuterol  5 mg Nebulization Once  . aspirin  81 mg Oral Daily  . atorvastatin  40 mg Oral Daily  . budesonide (PULMICORT) nebulizer solution  0.25 mg Nebulization BID  . chlorpheniramine-HYDROcodone  5 mL Oral Q12H  . darbepoetin (ARANESP) injection - NON-DIALYSIS  100 mcg Subcutaneous Q Tue-1800  . enoxaparin (LOVENOX) injection  40 mg Subcutaneous Q24H  . furosemide  120 mg Intravenous Q8H  . hydrALAZINE  10 mg Intravenous UD  . hydrALAZINE  37.5 mg Oral Q8H  . insulin aspart  0-20 Units Subcutaneous TID WC  . insulin aspart  0-5 Units Subcutaneous QHS  . insulin aspart  10 Units Subcutaneous TID WC  . insulin glargine  20 Units Subcutaneous QHS  . ipratropium   0.5 mg Nebulization BID  . isosorbide dinitrate  20 mg Oral TID  . metoprolol tartrate  12.5 mg Oral BID  . potassium chloride  20 mEq Oral BID  . predniSONE  30 mg Oral Q breakfast   . sodium chloride     acetaminophen, acetaminophen, albuterol, morphine injection, ondansetron (ZOFRAN) IV, ondansetron (ZOFRAN) IV, sodium chloride

## 2013-09-20 NOTE — Interval H&P Note (Signed)
Cath Lab Visit (complete for each Cath Lab visit)  Clinical Evaluation Leading to the Procedure:   ACS: no  Non-ACS:    Anginal Classification: No Symptoms  Anti-ischemic medical therapy: No Therapy  Non-Invasive Test Results: High-risk stress test findings: cardiac mortality >3%/year  Prior CABG: No previous CABG      History and Physical Interval Note:  09/20/2013 10:44 AM  [name]  has presented today for surgery, with the diagnosis of cp  The various methods of treatment have been discussed with the patient and family. After consideration of risks, benefits and other options for treatment, the patient has consented to  Procedure(s): LEFT HEART CATHETERIZATION WITH CORONARY ANGIOGRAM (N/A) as a surgical intervention .  The patient's history has been reviewed, patient examined, no change in status, stable for surgery.  I have reviewed the patient's chart and labs.  Questions were answered to the patient's satisfaction.     Runell Gess

## 2013-09-20 NOTE — CV Procedure (Signed)
[name] is a 32 y.o. female    [id number] LOCATION:  FACILITY: [facility]  PHYSICIAN: Nanetta Batty, M.D. 1981-03-20   DATE OF PROCEDURE:  09/20/2013  DATE OF DISCHARGE:     CARDIAC CATHETERIZATION     History obtained from chart review. The patient is 32 year old African American  female with pneumonia. A 2-D echo D.O. ejection fraction in the 30% range. A. Myoview stress test showed ischemia in the LAD territory. She is a type I diabetics and she was a teenager. She does have stage IV chronic renal insufficiency with creatinines in the 3 range and nephrotic range proteinuria. She presents today for diagnostic coronary arteriography via the right radial approach to rule out ischemic etiology to her LV dysfunction especially in light of her markedly positive Myoview stress test   PROCEDURE DESCRIPTION:   The patient was brought to the second floor [facility] cath lab in the postabsorptive state. She was premedicated with Valium 5 mg by mouth, IV Versed and fentanyl. Her right wristwas prepped and shaved in usual sterile fashion. Xylocaine 1% was used for local anesthesia. A 5 French sheath was inserted into the right radial artery using standard Seldinger technique. The patient received 5000 units  of heparin  intravenously.  A 5 [place] TIG catheter along with a JL 3.5 diagnostic catheter was used for selective coronary angiography. Left ventriculography was not performed to conserve contrast. A total of 60 cc of contrast was administered to the patient. Visipaque dye was used for the entirety of the case. retrograde aortic pressure was monitored during the case.    HEMODYNAMICS:    AO SYSTOLIC/AO DIASTOLIC: 129/95     ANGIOGRAPHIC RESULTS:   1. Left main; normal  2. LAD; minor regularities 3. Left circumflex; nondominant with minor irregularities.  4. Right coronary artery; dominant with minor irregularities 5. 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 I believe her Myoview was false positive probably related to breast attenuation artifact. I do not know the etiology of her LV dysfunction but I suspect is related to her diabetes and/or idiopathic. Continue medical therapy will be recommended. Given the degree of LV dysfunction she may be a candidate for a "LifeVest " as a bridge to potential ICD therapy for primary prevention  Runell Gess. MD, [facility] 09/20/2013 11:17 AM

## 2013-09-20 NOTE — H&P (View-Only) (Signed)
Subjective:  No CP/SOB  Objective:  Temp:  [97.2 F (36.2 C)-98.7 F (37.1 C)] 97.7 F (36.5 C) (12/23 0520) Pulse Rate:  [98-112] 98 (12/23 0745) Resp:  [18-20] 18 (12/23 0745) BP: (127-154)/(83-97) 154/95 mmHg (12/23 0520) SpO2:  [95 %-97 %] 96 % (12/23 0745) Weight:  [305 lb 4.8 oz (138.483 kg)-310 lb 6.5 oz (140.8 kg)] 310 lb 6.5 oz (140.8 kg) (12/23 0520) Weight change: -6 lb 1.6 oz (-2.767 kg)  Intake/Output from previous day: 12/22 0701 - 12/23 0700 In: 593 [P.O.:593] Out: 900 [Urine:900]  Intake/Output from this shift:    Physical Exam: General appearance: alert and no distress Neck: no adenopathy, no carotid bruit, no JVD, supple, symmetrical, trachea midline and thyroid not enlarged, symmetric, no tenderness/mass/nodules Lungs: clear to auscultation bilaterally Heart: regular rate and rhythm, S1, S2 normal, no murmur, click, rub or gallop Extremities: extremities normal, atraumatic, no cyanosis or edema  Lab Results: Results for orders placed during the hospital encounter of 09/15/13 (from the past 48 hour(s))  GLUCOSE, CAPILLARY     Status: Abnormal   Collection Time    09/18/13 12:19 PM      Result Value Range   Glucose-Capillary 228 (*) 70 - 99 mg/dL  PHOSPHORUS     Status: Abnormal   Collection Time    09/18/13  3:25 PM      Result Value Range   Phosphorus 5.2 (*) 2.3 - 4.6 mg/dL  IRON AND TIBC     Status: Abnormal   Collection Time    09/18/13  3:25 PM      Result Value Range   Iron 64  42 - 135 ug/dL   TIBC 161 (*) 096 - 045 ug/dL   Saturation Ratios 37  20 - 55 %   UIBC 108 (*) 125 - 400 ug/dL   Comment: Performed at [facility]  FERRITIN     Status: Abnormal   Collection Time    09/18/13  3:25 PM      Result Value Range   Ferritin 630 (*) 10 - 291 ng/mL   Comment: Performed at [facility]  KAPPA/LAMBDA LIGHT CHAINS     Status: Abnormal   Collection Time    09/18/13  3:25 PM      Result Value Range   Kappa free  light chain 3.80 (*) 0.33 - 1.94 mg/dL   Lamda free light chains 3.19 (*) 0.57 - 2.63 mg/dL   Kappa, lamda light chain ratio 1.19  0.26 - 1.65   Comment: Performed at [facility]  HEPATITIS PANEL, ACUTE     Status: None   Collection Time    09/18/13  3:25 PM      Result Value Range   Hepatitis B Surface Ag NEGATIVE  NEGATIVE   HCV Ab NEGATIVE  NEGATIVE   Hep A IgM NON REACTIVE  NON REACTIVE   Hep B C IgM NON REACTIVE  NON REACTIVE   Comment: (NOTE)     High levels of Hepatitis B Core IgM antibody are detectable     during the acute stage of Hepatitis B. This antibody is used     to differentiate current from past HBV infection.     Performed at [facility]  ANA     Status: None   Collection Time    09/18/13  3:25 PM      Result Value Range   ANA NEGATIVE  NEGATIVE   Comment: Performed at [facility]  C3 COMPLEMENT     Status: Abnormal   Collection Time    09/18/13  3:25 PM      Result Value Range   C3 Complement 195 (*) 90 - 180 mg/dL   Comment: Performed at [facility]  C4 COMPLEMENT     Status: Abnormal   Collection Time    09/18/13  3:25 PM      Result Value Range   Complement C4, Body Fluid 67 (*) 10 - 40 mg/dL   Comment: Performed at [facility]  PARATHYROID HORMONE, INTACT (NO CA)     Status: Abnormal   Collection Time    09/18/13  3:25 PM      Result Value Range   PTH 172.7 (*) 14.0 - 72.0 pg/mL   Comment: Performed at [facility]  GLUCOSE, CAPILLARY     Status: Abnormal   Collection Time    09/18/13  5:35 PM      Result Value Range   Glucose-Capillary 231 (*) 70 - 99 mg/dL  GLUCOSE, CAPILLARY     Status: Abnormal   Collection Time    09/18/13  9:24 PM      Result Value Range   Glucose-Capillary 219 (*) 70 - 99 mg/dL   Comment 1 Notify RN    RENAL FUNCTION PANEL     Status: Abnormal   Collection Time    09/19/13  5:06 AM      Result Value Range   Sodium 133 (*) 135 - 145 mEq/L   Potassium 3.6  3.5 -  5.1 mEq/L   Comment: DELTA CHECK NOTED   Chloride 99  96 - 112 mEq/L   CO2 23  19 - 32 mEq/L   Glucose, Bld 177 (*) 70 - 99 mg/dL   BUN 47 (*) 6 - 23 mg/dL   Creatinine, Ser 1.61 (*) 0.50 - 1.10 mg/dL   Calcium 8.3 (*) 8.4 - 10.5 mg/dL   Phosphorus 5.2 (*) 2.3 - 4.6 mg/dL   Albumin 1.5 (*) 3.5 - 5.2 g/dL   GFR calc non Af Amer 20 (*) >90 mL/min   GFR calc Af Amer 24 (*) >90 mL/min   Comment: (NOTE)     The eGFR has been calculated using the CKD EPI equation.     This calculation has not been validated in all clinical situations.     eGFR's persistently <90 mL/min signify possible Chronic Kidney     Disease.  GLUCOSE, CAPILLARY     Status: Abnormal   Collection Time    09/19/13  7:57 AM      Result Value Range   Glucose-Capillary 153 (*) 70 - 99 mg/dL  GLUCOSE, CAPILLARY     Status: Abnormal   Collection Time    09/19/13 11:38 AM      Result Value Range   Glucose-Capillary 109 (*) 70 - 99 mg/dL  GLUCOSE, CAPILLARY     Status: Abnormal   Collection Time    09/19/13  5:09 PM      Result Value Range   Glucose-Capillary 289 (*) 70 - 99 mg/dL  GLUCOSE, CAPILLARY     Status: Abnormal   Collection Time    09/19/13  9:50 PM      Result Value Range   Glucose-Capillary 318 (*) 70 - 99 mg/dL   Comment 1 Documented in Chart     Comment 2 Notify RN    RENAL FUNCTION PANEL     Status: Abnormal  Collection Time    09/20/13  5:08 AM      Result Value Range   Sodium 135  135 - 145 mEq/L   Potassium 4.4  3.5 - 5.1 mEq/L   Chloride 101  96 - 112 mEq/L   CO2 24  19 - 32 mEq/L   Glucose, Bld 214 (*) 70 - 99 mg/dL   BUN 51 (*) 6 - 23 mg/dL   Creatinine, Ser 9.60 (*) 0.50 - 1.10 mg/dL   Calcium 8.4  8.4 - 45.4 mg/dL   Phosphorus 5.5 (*) 2.3 - 4.6 mg/dL   Albumin 1.6 (*) 3.5 - 5.2 g/dL   GFR calc non Af Amer 21 (*) >90 mL/min   GFR calc Af Amer 24 (*) >90 mL/min   Comment: (NOTE)     The eGFR has been calculated using the CKD EPI equation.     This calculation has not been validated  in all clinical situations.     eGFR's persistently <90 mL/min signify possible Chronic Kidney     Disease.  GLUCOSE, CAPILLARY     Status: Abnormal   Collection Time    09/20/13  7:56 AM      Result Value Range   Glucose-Capillary 168 (*) 70 - 99 mg/dL   Comment 1 Documented in Chart     Comment 2 Notify RN      Imaging: Imaging results have been reviewed  Assessment/Plan:   1. Principal Problem: 2.   Community acquired pneumonia 3. Active Problems: 4.   Nausea and vomiting 5.   Diabetes mellitus- uncontrolled 6.   Normocytic anemia 7.   Acute renal insuficency 8.   Dehydration 9.   Hyperlipidemia 10.   Hypertension 11.   Asthma exacerbation 12.   Cardiomyopathy- etiol undetermined- EF 35-40% 13.   Morbid obesity- BMI 48 14.   Time Spent Directly with Patient:  20 minutes  Length of Stay:  LOS: 5 days   For radial cath today with limited contrast to define anatomy in setting of LV dysfunction and + myoview. Discussed with pt, Drs Lucrezia Starch and Kathrene Bongo. Exam benign. SCr 2.86.   Runell Gess 09/20/2013, 8:46 AM

## 2013-09-20 NOTE — Progress Notes (Signed)
   Subjective:  No CP/SOB  Objective:  Temp:  [97.2 F (36.2 C)-98.7 F (37.1 C)] 97.7 F (36.5 C) (12/23 0520) Pulse Rate:  [98-112] 98 (12/23 0745) Resp:  [18-20] 18 (12/23 0745) BP: (127-154)/(83-97) 154/95 mmHg (12/23 0520) SpO2:  [95 %-97 %] 96 % (12/23 0745) Weight:  [305 lb 4.8 oz (138.483 kg)-310 lb 6.5 oz (140.8 kg)] 310 lb 6.5 oz (140.8 kg) (12/23 0520) Weight change: -6 lb 1.6 oz (-2.767 kg)  Intake/Output from previous day: 12/22 0701 - 12/23 0700 In: 593 [P.O.:593] Out: 900 [Urine:900]  Intake/Output from this shift:    Physical Exam: General appearance: alert and no distress Neck: no adenopathy, no carotid bruit, no JVD, supple, symmetrical, trachea midline and thyroid not enlarged, symmetric, no tenderness/mass/nodules Lungs: clear to auscultation bilaterally Heart: regular rate and rhythm, S1, S2 normal, no murmur, click, rub or gallop Extremities: extremities normal, atraumatic, no cyanosis or edema  Lab Results: Results for orders placed during the hospital encounter of 09/15/13 (from the past 48 hour(s))  GLUCOSE, CAPILLARY     Status: Abnormal   Collection Time    09/18/13 12:19 PM      Result Value Range   Glucose-Capillary 228 (*) 70 - 99 mg/dL  PHOSPHORUS     Status: Abnormal   Collection Time    09/18/13  3:25 PM      Result Value Range   Phosphorus 5.2 (*) 2.3 - 4.6 mg/dL  IRON AND TIBC     Status: Abnormal   Collection Time    09/18/13  3:25 PM      Result Value Range   Iron 64  42 - 135 ug/dL   TIBC 172 (*) 250 - 470 ug/dL   Saturation Ratios 37  20 - 55 %   UIBC 108 (*) 125 - 400 ug/dL   Comment: Performed at [facility]  FERRITIN     Status: Abnormal   Collection Time    09/18/13  3:25 PM      Result Value Range   Ferritin 630 (*) 10 - 291 ng/mL   Comment: Performed at [facility]  KAPPA/LAMBDA LIGHT CHAINS     Status: Abnormal   Collection Time    09/18/13  3:25 PM      Result Value Range   Kappa free  light chain 3.80 (*) 0.33 - 1.94 mg/dL   Lamda free light chains 3.19 (*) 0.57 - 2.63 mg/dL   Kappa, lamda light chain ratio 1.19  0.26 - 1.65   Comment: Performed at [facility]  HEPATITIS PANEL, ACUTE     Status: None   Collection Time    09/18/13  3:25 PM      Result Value Range   Hepatitis B Surface Ag NEGATIVE  NEGATIVE   HCV Ab NEGATIVE  NEGATIVE   Hep A IgM NON REACTIVE  NON REACTIVE   Hep B C IgM NON REACTIVE  NON REACTIVE   Comment: (NOTE)     High levels of Hepatitis B Core IgM antibody are detectable     during the acute stage of Hepatitis B. This antibody is used     to differentiate current from past HBV infection.     Performed at [facility]  ANA     Status: None   Collection Time    09/18/13  3:25 PM      Result Value Range   ANA NEGATIVE  NEGATIVE   Comment: Performed at [facility]  C3 COMPLEMENT     Status: Abnormal   Collection Time    09/18/13  3:25 PM      Result Value Range   C3 Complement 195 (*) 90 - 180 mg/dL   Comment: Performed at [facility]  C4 COMPLEMENT     Status: Abnormal   Collection Time    09/18/13  3:25 PM      Result Value Range   Complement C4, Body Fluid 67 (*) 10 - 40 mg/dL   Comment: Performed at [facility]  PARATHYROID HORMONE, INTACT (NO CA)     Status: Abnormal   Collection Time    09/18/13  3:25 PM      Result Value Range   PTH 172.7 (*) 14.0 - 72.0 pg/mL   Comment: Performed at [facility]  GLUCOSE, CAPILLARY     Status: Abnormal   Collection Time    09/18/13  5:35 PM      Result Value Range   Glucose-Capillary 231 (*) 70 - 99 mg/dL  GLUCOSE, CAPILLARY     Status: Abnormal   Collection Time    09/18/13  9:24 PM      Result Value Range   Glucose-Capillary 219 (*) 70 - 99 mg/dL   Comment 1 Notify RN    RENAL FUNCTION PANEL     Status: Abnormal   Collection Time    09/19/13  5:06 AM      Result Value Range   Sodium 133 (*) 135 - 145 mEq/L   Potassium 3.6  3.5 -  5.1 mEq/L   Comment: DELTA CHECK NOTED   Chloride 99  96 - 112 mEq/L   CO2 23  19 - 32 mEq/L   Glucose, Bld 177 (*) 70 - 99 mg/dL   BUN 47 (*) 6 - 23 mg/dL   Creatinine, Ser 2.86 (*) 0.50 - 1.10 mg/dL   Calcium 8.3 (*) 8.4 - 10.5 mg/dL   Phosphorus 5.2 (*) 2.3 - 4.6 mg/dL   Albumin 1.5 (*) 3.5 - 5.2 g/dL   GFR calc non Af Amer 20 (*) >90 mL/min   GFR calc Af Amer 24 (*) >90 mL/min   Comment: (NOTE)     The eGFR has been calculated using the CKD EPI equation.     This calculation has not been validated in all clinical situations.     eGFR's persistently <90 mL/min signify possible Chronic Kidney     Disease.  GLUCOSE, CAPILLARY     Status: Abnormal   Collection Time    09/19/13  7:57 AM      Result Value Range   Glucose-Capillary 153 (*) 70 - 99 mg/dL  GLUCOSE, CAPILLARY     Status: Abnormal   Collection Time    09/19/13 11:38 AM      Result Value Range   Glucose-Capillary 109 (*) 70 - 99 mg/dL  GLUCOSE, CAPILLARY     Status: Abnormal   Collection Time    09/19/13  5:09 PM      Result Value Range   Glucose-Capillary 289 (*) 70 - 99 mg/dL  GLUCOSE, CAPILLARY     Status: Abnormal   Collection Time    09/19/13  9:50 PM      Result Value Range   Glucose-Capillary 318 (*) 70 - 99 mg/dL   Comment 1 Documented in Chart     Comment 2 Notify RN    RENAL FUNCTION PANEL     Status: Abnormal     Collection Time    09/20/13  5:08 AM      Result Value Range   Sodium 135  135 - 145 mEq/L   Potassium 4.4  3.5 - 5.1 mEq/L   Chloride 101  96 - 112 mEq/L   CO2 24  19 - 32 mEq/L   Glucose, Bld 214 (*) 70 - 99 mg/dL   BUN 51 (*) 6 - 23 mg/dL   Creatinine, Ser 2.82 (*) 0.50 - 1.10 mg/dL   Calcium 8.4  8.4 - 10.5 mg/dL   Phosphorus 5.5 (*) 2.3 - 4.6 mg/dL   Albumin 1.6 (*) 3.5 - 5.2 g/dL   GFR calc non Af Amer 21 (*) >90 mL/min   GFR calc Af Amer 24 (*) >90 mL/min   Comment: (NOTE)     The eGFR has been calculated using the CKD EPI equation.     This calculation has not been validated  in all clinical situations.     eGFR's persistently <90 mL/min signify possible Chronic Kidney     Disease.  GLUCOSE, CAPILLARY     Status: Abnormal   Collection Time    09/20/13  7:56 AM      Result Value Range   Glucose-Capillary 168 (*) 70 - 99 mg/dL   Comment 1 Documented in Chart     Comment 2 Notify RN      Imaging: Imaging results have been reviewed  Assessment/Plan:   1. Principal Problem: 2.   Community acquired pneumonia 3. Active Problems: 4.   Nausea and vomiting 5.   Diabetes mellitus- uncontrolled 6.   Normocytic anemia 7.   Acute renal insuficency 8.   Dehydration 9.   Hyperlipidemia 10.   Hypertension 11.   Asthma exacerbation 12.   Cardiomyopathy- etiol undetermined- EF 35-40% 13.   Morbid obesity- BMI 48 14.   Time Spent Directly with Patient:  20 minutes  Length of Stay:  LOS: 5 days   For radial cath today with limited contrast to define anatomy in setting of LV dysfunction and + myoview. Discussed with pt, Drs Ghimre and [place]. Exam benign. SCr 2.86.   Berdine Rasmusson J 09/20/2013, 8:46 AM 

## 2013-09-20 NOTE — Progress Notes (Signed)
RN attempt to call report to [facility] no one answers phone on unit. Will attempt to call again.

## 2013-09-20 NOTE — Progress Notes (Addendum)
PATIENT DETAILS Name: [name] Age: 32 y.o. Sex: female Date of Birth: 1980/12/21 Admit Date: 09/15/2013 Admitting Physician Maryruth Bun Rama, MD PCP:No PCP Per Patient  Brief summary Patient is a 32 year old African American female with a history of chronic kidney disease stage III-fall, diabetes, hypertension who presented to the ED on 5/18 with cough, wheezing along with shortness of breath. She was also vomiting. Chest x-ray done on admission showed patchy bilateral airspace disease In the ED she was found to have an elevated creatinine of 2.72, she was then admitted for further evaluation and treatment. Her hospitalization has been complicated by development of pulmonary edema secondary to acute systolic heart failure, fluid overload, acute on chronic kidney disease. A nuclear stress test done during this hospitalization was positive for anterior wall ischemia. Currently after IV Lasix, patient is much more compensated, she has completed a course of intravenous antibiotics. Her creatinine has stabilized. She has improved significantly. Cardiology is planning a cardiac catheterization 12/23. Nephrology and cardiology have been consulted during this hospital stay.   Subjective: No major complaints- feels better  Assessment/Plan: Principal Problem:   Community acquired pneumonia - Afebrile, no leukocytosis. Improved significantly - Has completed a five-day course o Rocephin and Zithromax- not currently on any antibiotics - Blood cultures 12/18- negative - Sputum culture 12/19-negative - Influenza PCR negative - HIV negative - Urine strep pneumo antigen negative, Legionella antigen negative.  Active Problems: Acute hypoxic respiratory failure - Secondary to above and from acute asthmatic bronchitis/exacerbation and acute systolic heart failure - Continue oxygen via nasal cannula, continue scheduled nebulized bronchodilators,intravenous steroids. Continue IV Levaquin and IV  Lasix - Resolved  Asthma with exacerbation - Continue nebulized bronchodilators,initially on solumedrol now transitioned to prednisone-will taper,antibiotics as above. - Add incentive spirometry and flutter valve.  Acute Systolic CHF -On IV Lasix-per Nephrology. Significant improvement.  -c/w Metoprolol -No ACEI/ARB given CKD  -daily weights -strict I&O's -cardiology following-2 day Nuc Stress underway  Positive nuclear stress test - Per cardiology high-risk study, given involvement of anterior wall- will benefit from cardiac catheterization. Had a long discussion with patient this morning, she does know risk of contrast-induced nephropathy leading to worsening renal failure that she could potentially be dialysis dependent. She understands the risks, is willing to accept the risk of worsening renal failure and would like to proceed with cardiac catheterization. I offered to talk to her parents, however she politely declined, ande said she would talk to them herself.  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 Hemoglobin A1c 10.0 - c/w Lantus 20 units each bedtime, c/w NovoLog 10 units 3 times a day. Continue with SSI. We'll continue to adjust Lantus/NovoLog depending on CBG readings.  Hypertension - Controlled  with IV lasix,Hydralazine/Imdur/Metoprolol -c/w Metoprolol - We'll continue to monitor blood pressure trend and adjust accordingly.  Nausea with vomiting - Resolved, continue with supportive care.  Anemia -suspect secondary to CKD - Anemia workup consistent with anemia  of chronic disease Dyslipidemia - Continue with statins  Disposition: Remain inpatient-home in 1-2 days  DVT Prophylaxis: Prophylactic Lovenox   Code Status: Full code   Family Communication None at bedside  Procedures:  None  CONSULTS:  None  MEDICATIONS: Scheduled Meds: . albuterol  2.5 mg Nebulization BID  . albuterol  5 mg Nebulization Once  . atorvastatin  40 mg Oral Daily  . budesonide (PULMICORT) nebulizer solution  0.25 mg Nebulization BID  . chlorpheniramine-HYDROcodone  5 mL Oral Q12H  . darbepoetin (ARANESP) injection - NON-DIALYSIS  100 mcg Subcutaneous Q Tue-1800  . enoxaparin (LOVENOX) injection  40 mg Subcutaneous Q24H  . furosemide  120 mg Intravenous Q8H  . hydrALAZINE  37.5 mg Oral Q8H  . insulin aspart  0-20 Units Subcutaneous TID WC  . insulin aspart  0-5 Units Subcutaneous QHS  . insulin aspart  10 Units Subcutaneous TID WC  . insulin glargine  20 Units Subcutaneous QHS  . ipratropium  0.5 mg Nebulization BID  . isosorbide dinitrate  20 mg Oral TID  . metoprolol tartrate  12.5 mg Oral BID  . potassium chloride  20 mEq Oral BID  . predniSONE  30 mg Oral Q breakfast  . sodium chloride  3 mL Intravenous Q12H   Continuous Infusions: . sodium chloride 300 mL (09/20/13 0931)   PRN Meds:.sodium chloride, acetaminophen, albuterol, ondansetron (ZOFRAN) IV, sodium chloride, sodium chloride  Antibiotics: Anti-infectives   Start     Dose/Rate Route Frequency Ordered Stop   09/16/13 1000  azithromycin (ZITHROMAX) tablet 500 mg     500 mg Oral Daily 09/16/13 0946 09/19/13 1122   09/16/13 0600  cefTRIAXone (ROCEPHIN) 1 g in dextrose 5 % 50 mL IVPB  Status:  Discontinued     1  g 100 mL/hr over 30 Minutes Intravenous Every 24 hours 09/15/13 2000 09/19/13 0910   09/15/13 1815  cefTRIAXone (ROCEPHIN) 1 g in dextrose 5 % 50 mL IVPB  Status:  Discontinued     1 g 100 mL/hr over 30 Minutes Intravenous Every 24 hours 09/15/13 1813 09/15/13 1959   09/15/13 1815  azithromycin (ZITHROMAX) 500 mg in dextrose 5 % 250 mL IVPB  Status:  Discontinued     500 mg 250 mL/hr over 60 Minutes Intravenous Every 24 hours 09/15/13 1813 09/16/13 0945   09/15/13 1645  cefTRIAXone (ROCEPHIN) 1 g in dextrose 5 % 50 mL IVPB     1 g 100 mL/hr over 30 Minutes Intravenous  Once 09/15/13 1642 09/15/13 1844   09/15/13 1645  azithromycin (ZITHROMAX) 500 mg in dextrose 5 % 250 mL IVPB  Status:  Discontinued     500 mg 250 mL/hr over 60 Minutes Intravenous  Once 09/15/13 1642 09/16/13 1020       PHYSICAL EXAM: Vital signs in last 24 hours: Filed Vitals:   09/19/13 2148 09/20/13 0520 09/20/13 0745 09/20/13 1012  BP: 138/97 154/95  161/106  Pulse: 112 100 98 113  Temp: 98.7 F (37.1 C) 97.7 F (36.5 C)  97.9 F (36.6 C)  TempSrc: Oral Oral  Oral  Resp: 18 18 18 20   Height:      Weight:  140.8 kg (310 lb 6.5 oz)    SpO2: 97% 95% 96% 98%    Weight change: -2.767 kg (-6 lb 1.6 oz) Filed Weights   09/18/13 0810 09/19/13 1442 09/20/13 0520  Weight: 141.25 kg (311 lb 6.4 oz) 138.483 kg (305 lb 4.8 oz) 140.8 kg (310 lb 6.5 oz)  Body mass index is 47.21 kg/(m^2).   Gen Exam: Awake and alert with clear speech.  Not in acute distress. Neck: Supple, No JVD.  Chest: Good air entry bilaterally, clear lungs today CVS: S1 S2 Regular, no murmurs.  Abdomen: soft, BS +, non tender, non distended.  Extremities: 1+ edema, lower extremities warm to touch. Neurologic: Non Focal.   Skin: No Rash.   Wounds: N/A.   Intake/Output from previous day:  Intake/Output Summary (Last 24 hours) at 09/20/13 1032 Last data filed at 09/20/13 0530  Gross per 24 hour  Intake    593 ml  Output    900 ml   Net   -307 ml     LAB RESULTS: CBC  Recent Labs Lab 09/15/13 1244 09/15/13 2020 09/17/13 0850 09/18/13 0422  WBC 4.0 7.7 13.0* 11.9*  HGB 9.9* 9.0* 9.9* 9.5*  HCT 30.0* 26.8* 30.5* 29.3*  PLT 312 263 374 428*  MCV 82.4 82.0 83.1 84.0  MCH 27.2 27.5 27.0 27.2  MCHC 33.0 33.6 32.5 32.4  RDW 13.6 13.6 13.6 13.9  LYMPHSABS 1.8  --   --   --   MONOABS 0.3  --   --   --   EOSABS 0.0  --   --   --   BASOSABS 0.0  --   --   --     Chemistries   Recent Labs Lab 09/16/13 0608 09/17/13 0850 09/18/13 0422 09/19/13 0506 09/20/13 0508  NA 127* 130* 133* 133* 135  K 4.2 4.3 4.4 3.6 4.4  CL 96 99 100 99 101  CO2 17* 18* 21 23 24   GLUCOSE 392* 321* 248* 177* 214*  BUN 37* 43* 49* 47* 51*  CREATININE 2.59* 2.93* 3.10* 2.86* 2.82*  CALCIUM 8.1* 8.1* 7.8* 8.3* 8.4    CBG:  Recent Labs Lab 09/19/13 1138 09/19/13 1709 09/19/13 2150 09/20/13 0756 09/20/13 1008  GLUCAP 109* 289* 318* 168* 161*    GFR Estimated Creatinine Clearance: 42 ml/min (by C-G formula based on Cr of 2.82).  Coagulation profile No results found for this basename: INR, PROTIME,  in the last 168 hours  Cardiac Enzymes No results found for this basename: CK, CKMB, TROPONINI, MYOGLOBIN,  in the last 168 hours  No components found with this basename: POCBNP,  No results found for this basename: DDIMER,  in the last 72 hours No results found for this basename: HGBA1C,  in the last 72 hours No results found for this basename: CHOL, HDL, LDLCALC, TRIG, CHOLHDL, LDLDIRECT,  in the last 72 hours No results found for this basename: TSH, T4TOTAL, FREET3, T3FREE, THYROIDAB,  in the last 72 hours  Recent Labs  09/18/13 1525  FERRITIN 630*  TIBC 172*  IRON 64   No results found for this basename: LIPASE, AMYLASE,  in the last 72 hours  Urine Studies No results found for this basename: UACOL, UAPR, USPG, UPH, UTP, UGL, UKET, UBIL, UHGB, UNIT, UROB, ULEU, UEPI, UWBC, URBC, UBAC, CAST, CRYS, UCOM,  BILUA,  in the last 72 hours  MICROBIOLOGY: Recent Results (from the past 240 hour(s))  CULTURE, BLOOD (ROUTINE X 2)     Status: None   Collection Time    09/15/13  8:20 PM      Result Value Range Status   Specimen Description BLOOD ARM LEFT   Final   Special Requests BOTTLES DRAWN AEROBIC ONLY 5CC   Final   Culture  Setup Time     Final   Value: 09/16/2013  02:08     Performed at [facility]     Final   Value:        BLOOD CULTURE RECEIVED NO GROWTH TO DATE CULTURE WILL BE HELD FOR 5 DAYS BEFORE ISSUING A FINAL NEGATIVE REPORT     Performed at [facility]   Report Status PENDING   Incomplete  CULTURE, BLOOD (ROUTINE X 2)     Status: None   Collection Time    09/15/13  8:30 PM      Result Value Range Status   Specimen Description BLOOD ARM LEFT   Final   Special Requests BOTTLES DRAWN AEROBIC ONLY 2CC   Final   Culture  Setup Time     Final   Value: 09/16/2013 02:08     Performed at [facility]   Culture     Final   Value:        BLOOD CULTURE RECEIVED NO GROWTH TO DATE CULTURE WILL BE HELD FOR 5 DAYS BEFORE ISSUING A FINAL NEGATIVE REPORT     Performed at [facility]   Report Status PENDING   Incomplete  CULTURE, EXPECTORATED SPUTUM-ASSESSMENT     Status: None   Collection Time    09/15/13 11:06 PM      Result Value Range Status   Specimen Description SPUTUM   Final   Special Requests NONE   Final   Sputum evaluation     Final   Value: MICROSCOPIC FINDINGS SUGGEST THAT THIS SPECIMEN IS NOT REPRESENTATIVE OF LOWER RESPIRATORY SECRETIONS. PLEASE RECOLLECT.     CALLED TO A.THOMPSON,RN 0020 09/16/13 M.CAMPBELL   Report Status 09/16/2013 FINAL   Final  CULTURE, EXPECTORATED SPUTUM-ASSESSMENT     Status: None   Collection Time    09/16/13 10:20 PM      Result Value Range Status   Specimen Description SPUTUM   Final   Special Requests NONE   Final   Sputum evaluation     Final   Value: THIS SPECIMEN IS ACCEPTABLE. RESPIRATORY  CULTURE REPORT TO FOLLOW.   Report Status 09/17/2013 FINAL   Final  CULTURE, RESPIRATORY (NON-EXPECTORATED)     Status: None   Collection Time    09/16/13 10:20 PM      Result Value Range Status   Specimen Description SPUTUM   Final   Special Requests NONE   Final   Gram Stain     Final   Value: MODERATE WBC PRESENT,BOTH PMN AND MONONUCLEAR     NO SQUAMOUS EPITHELIAL CELLS SEEN     NO ORGANISMS SEEN     Performed at [facility]   Culture     Final   Value: NORMAL OROPHARYNGEAL FLORA     Performed at [facility]   Report Status 09/19/2013 FINAL   Final    RADIOLOGY STUDIES/RESULTS: Dg Chest 2 View  09/15/2013   CLINICAL DATA:  Cough for 2-1/2 weeks with shortness of breath and fever at times.  EXAM: CHEST  2 VIEW  COMPARISON:  09/16/2013  FINDINGS: Lungs are adequately inflated with bilateral patchy airspace opacification most prominent over the left base with mild overall worsening. Findings are likely due to infection. No evidence of effusion. Mild stable cardiomegaly. Remainder of the exam is unchanged.  IMPRESSION: Mild worsening of a patchy bilateral airspace process worse over the left base likely due to infection.  Mild stable cardiomegaly.   Electronically Signed   By: Elberta Fortis M.D.  On: 09/15/2013 15:58   Dg Chest 2 View  09/06/2013   CLINICAL DATA:  Cough, fever.  EXAM: CHEST  2 VIEW  COMPARISON:  None.  FINDINGS: The heart size and mediastinal contours are within normal limits. No pneumothorax or pleural effusion is noted. Bilateral peribronchial thickening is noted concerning for bronchitis or possibly perihilar pneumonitis. The visualized skeletal structures are unremarkable.  IMPRESSION: Findings consistent with either bilateral bronchitis or perihilar pneumonitis.   Electronically Signed   By: Roque Lias M.D.   On: 09/06/2013 11:07    Jeoffrey Massed, MD  [facility] Pager:[phone]  If 7PM-7AM, please contact  night-coverage [email or web address] Password [facility] 09/20/2013, 10:32 AM   LOS: 5 days

## 2013-09-21 DIAGNOSIS — I519 Heart disease, unspecified: Secondary | ICD-10-CM

## 2013-09-21 DIAGNOSIS — I5041 Acute combined systolic (congestive) and diastolic (congestive) heart failure: Secondary | ICD-10-CM

## 2013-09-21 LAB — RENAL FUNCTION PANEL
BUN: 52 mg/dL — ABNORMAL HIGH (ref 6–23)
CO2: 23 mEq/L (ref 19–32)
Calcium: 8.3 mg/dL — ABNORMAL LOW (ref 8.4–10.5)
Chloride: 99 mEq/L (ref 96–112)
Creatinine, Ser: 2.75 mg/dL — ABNORMAL HIGH (ref 0.50–1.10)
GFR calc non Af Amer: 21 mL/min — ABNORMAL LOW (ref >90)
Phosphorus: 5.3 mg/dL — ABNORMAL HIGH (ref 2.3–4.6)

## 2013-09-21 LAB — GLUCOSE, CAPILLARY
Glucose-Capillary: 165 mg/dL — ABNORMAL HIGH (ref 70–99)
Glucose-Capillary: 105 mg/dL — ABNORMAL HIGH (ref 70–99)
Glucose-Capillary: 188 mg/dL — ABNORMAL HIGH (ref 70–99)
Glucose-Capillary: 200 mg/dL — ABNORMAL HIGH (ref 70–99)

## 2013-09-21 MED ORDER — CARVEDILOL 12.5 MG PO TABS
12.5000 mg | ORAL_TABLET | Freq: Two times a day (BID) | ORAL | Status: DC
  Administered 2013-09-21 – 2013-09-22 (×3): 12.5 mg via ORAL
  Filled 2013-09-21 (×5): qty 1

## 2013-09-21 NOTE — Progress Notes (Signed)
[facility] Progress Note    Subjective: No acute events overnight. Urine output has been good and creatinine has remained stable. Blood pressures are little bit on the high side but is not requiring any oxygen.   Exam  Blood pressure 146/97, pulse 98, temperature 97.8 F (36.6 C), temperature source Oral, resp. rate 20, height 5\' 8"  (1.727 m), weight 141.2 kg (311 lb 4.6 oz), last menstrual period 09/08/2013, SpO2 96.00%. gen: alert, no distress neck: + jvd, R ij line in place  chest: bilateral rales 1/3 up the post lung fields  cor: regular, no gallop or rub, no M, pedal pulses intact  abd: soft, obese, nontender, no HSM  ext: 2+ edema bilat LE's neuro: alert, ox3, nonfocal   CXR > LLL infiltrate  EF 35-40% by ECHO UA  >300 proteinuria and 3-6 rbc/hpf Renal US > 10.9/11.1 cm kidneys, no hydro  Assessment/Plan:  1. CKD stage IV: pt was seeing a nephrologist in [place] before moving here. Prob due to DM and HTN. She has had DM for 23 years and HTN for about 10. eGFR 20 mL/min. Saving L arm. No immediate indications for dialysis. Likely we'll not pursue access  this hospitalization.   We'll arrange nephrology outpatient followup here at time of discharge. Hopefully there will not be significant insult given contrast exposure. So far so good with good urine output and no change in creatinine. 2. Volume overload / pulm edema: continue IV lasix 120 q 8- diuresing well but not yet at dry weight. Blood pressure still up but could be some do to steroids. Increased metoprolol yesterday.  Will continue  low dose potassium replacement. We'll be able to change Lasix to by mouth at the time of discharge but will continue IV for now. 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 Anemia / CKD: Hb 9.9. Iron sat is good. Have given 1 dose of Aranesp 5. DM2- onset age 1 , 23 yrs duration, hx retinopathy 6. Cardiomyopathy, EF 35-40%: cardiology following, WMA by echo. Likely skewed  in the setting of acute heart failure. Cardiac dysfunction likely secondary to long-standing and poorly controlled blood pressure. I stressed her the importance of good blood pressure control moving forward. This has not yet been achieved. 7. dispo- from a renal standpoint, his creatinine stays stable tomorrow  she would be safe for discharge from my standpoint. Outpatient nephrology followup will be arranged.   Terreon Ekholm A   09/21/2013, 9:42 AM   Recent Labs Lab 09/19/13 0506 09/20/13 0508 09/21/13 0440  NA 133* 135 133*  K 3.6 4.4 3.9  CL 99 101 99  CO2 23 24 23   GLUCOSE 177* 214* 187*  BUN 47* 51* 52*  CREATININE 2.86* 2.82* 2.75*  CALCIUM 8.3* 8.4 8.3*  PHOS 5.2* 5.5* 5.3*    Recent Labs Lab 09/15/13 1244 09/19/13 0506 09/20/13 0508 09/21/13 0440  AST 20  --   --   --   ALT 13  --   --   --   ALKPHOS 76  --   --   --   BILITOT 0.2*  --   --   --   PROT 6.1  --   --   --   ALBUMIN 1.4* 1.5* 1.6* 1.6*    Recent Labs Lab 09/15/13 1244 09/15/13 2020 09/17/13 0850 09/18/13 0422  WBC 4.0 7.7 13.0* 11.9*  NEUTROABS 2.0  --   --   --   HGB 9.9* 9.0*  9.9* 9.5*  HCT 30.0* 26.8* 30.5* 29.3*  MCV 82.4 82.0 83.1 84.0  PLT 312 263 374 428*   . albuterol  2.5 mg Nebulization BID  . albuterol  5 mg Nebulization Once  . aspirin  81 mg Oral Daily  . atorvastatin  40 mg Oral Daily  . budesonide (PULMICORT) nebulizer solution  0.25 mg Nebulization BID  . carvedilol  12.5 mg Oral BID WC  . chlorpheniramine-HYDROcodone  5 mL Oral Q12H  . darbepoetin (ARANESP) injection - NON-DIALYSIS  100 mcg Subcutaneous Q Tue-1800  . enoxaparin (LOVENOX) injection  40 mg Subcutaneous Q24H  . furosemide  120 mg Intravenous Q8H  . hydrALAZINE  10 mg Intravenous UD  . hydrALAZINE  37.5 mg Oral Q8H  . insulin aspart  0-20 Units Subcutaneous TID WC  . insulin aspart  0-5 Units Subcutaneous QHS  . insulin aspart  10 Units Subcutaneous TID WC  . insulin glargine  20 Units  Subcutaneous QHS  . ipratropium  0.5 mg Nebulization BID  . isosorbide dinitrate  20 mg Oral TID  . potassium chloride  20 mEq Oral BID  . predniSONE  30 mg Oral Q breakfast     acetaminophen, acetaminophen, albuterol, morphine injection, ondansetron (ZOFRAN) IV, ondansetron (ZOFRAN) IV, sodium chloride

## 2013-09-21 NOTE — Progress Notes (Signed)
Subjective:  No SOB at rest.  Objective:  Vital Signs in the last 24 hours: Temp:  [97.5 F (36.4 C)-98 F (36.7 C)] 98 F (36.7 C) (12/24 0447) Pulse Rate:  [86-115] 86 (12/24 0447) Resp:  [16-20] 20 (12/24 0447) BP: (137-170)/(88-106) 155/99 mmHg (12/24 0447) SpO2:  [96 %-98 %] 96 % (12/24 0447) Weight:  [311 lb 4.6 oz (141.2 kg)] 311 lb 4.6 oz (141.2 kg) (12/24 0500)  Intake/Output from previous day:  Intake/Output Summary (Last 24 hours) at 09/21/13 0753 Last data filed at 09/21/13 0600  Gross per 24 hour  Intake    360 ml  Output   1300 ml  Net   -940 ml    Physical Exam: General appearance: alert, cooperative and no distress Lungs: clear to auscultation bilaterally Heart: regular rate and rhythm Extremities: Rt wrist OK   Rate: 90  Rhythm: normal sinus rhythm  Lab Results: No results found for this basename: WBC, HGB, PLT,  in the last 72 hours  Recent Labs  09/20/13 0508 09/21/13 0440  NA 135 133*  K 4.4 3.9  CL 101 99  CO2 24 23  GLUCOSE 214* 187*  BUN 51* 52*  CREATININE 2.82* 2.75*   No results found for this basename: TROPONINI, CK, MB,  in the last 72 hours  Recent Labs  09/20/13 1020  INR 1.01    Imaging: Imaging results have been reviewed  Cardiac Studies:  Assessment/Plan:   Principal Problem:   Acute combined systolic and diastolic heart failure Active Problems:   Non-ischemic cardiomyopathy - false positive Myoview- EF 45-40%   Community acquired pneumonia   Diabetes mellitus- uncontrolled   Acute renal insuficency   Dehydration   Hypertension   Asthma exacerbation   Nausea and vomiting   Normocytic anemia   Hyperlipidemia   Morbid obesity- BMI 48    PLAN: Diuretics per renal, SCr stable today. Challenge her with Coreg, watch for increased wheezing. We will see again Friday.   Corine Shelter PA-C Beeper [phone] 09/21/2013, 7:53 AM   Agree with note written by Corine Shelter [facility]  S/P radial cath by myself yesterday  with minimal CAD, NISCM prob secondary to HTN/DM. BNP still elevated. Getting IV diuretics. Exam benign. Plan will be to optimize med Rx. Close OP follow up with [place].   Runell Gess 09/21/2013 8:09 AM

## 2013-09-21 NOTE — Plan of Care (Signed)
Problem: Food- and Nutrition-Related Knowledge Deficit (NB-1.1) Goal: Nutrition education Formal process to instruct or train a patient/client in a skill or to impart knowledge to help patients/clients voluntarily manage or modify food choices and eating behavior to maintain or improve health. Outcome: Completed/Met Date Met:  09/21/13 Nutrition Education Note  RD consulted for nutrition education regarding new onset CHF.  RD provided "Low Sodium Nutrition Therapy" handout from the Academy of [employer]. Reviewed patient's dietary recall. Provided examples on ways to decrease sodium intake in diet. Discouraged intake of processed foods and use of salt shaker. Encouraged fresh fruits and vegetables as well as whole grain sources of carbohydrates to maximize fiber intake.   RD discussed why it is important for patient to adhere to diet recommendations, and emphasized the role of fluids, foods to avoid, and importance of weighing self daily. Teach back method used.  Pt with two children and reports poor eating habits. Pt does not seem interested in education. Pt did listen but states she does not use salt and does not like salty foods. Pt with no questions and declined any additional education for her uncontrolled diabetes.  Expect poor-fair compliance.  Body mass index is 47.34 kg/(m^2). Pt meets criteria for obesity class III based on current BMI. Lab Results  Component Value Date    HGBA1C 10.0* 09/15/2013   Current diet order is CHO Modified, patient is consuming approximately 100% of meals at this time. Labs and medications reviewed. No further nutrition interventions warranted at this time. RD contact information provided. If additional nutrition issues arise, please re-consult RD.   Kendell Bane RD, LDN, CNSC [phone] Pager [phone] After Hours Pager

## 2013-09-21 NOTE — Progress Notes (Signed)
PATIENT DETAILS Name: [name] Age: 31 y.o. Sex: female Date of Birth: [birth year] Admit Date: 09/15/2013 Admitting Physician Maryruth Bun Rama, MD PCP:No PCP Per Patient  Brief summary Patient is a 32 year old African American female with a history of chronic kidney disease stage III-fall, diabetes, hypertension who presented to the ED on 5/18 with cough, wheezing along with shortness of breath. She was also vomiting. Chest x-ray done on admission showed patchy bilateral airspace disease In the ED she was found to have an elevated creatinine of 2.72, she was then admitted for further evaluation and treatment. Her hospitalization has been complicated by development of pulmonary edema secondary to acute systolic heart failure, fluid overload, acute on chronic kidney disease. A nuclear stress test done during this hospitalization was positive for anterior wall ischemia. Currently after IV Lasix, patient is much more compensated, she has completed a course of intravenous antibiotics. Her creatinine has stabilized. She has improved significantly. Patient S/P cardiac cath which show minimal CAD. Plan is for medical management.   Subjective: Breathing better. Denies chest pain.   Assessment/Plan: Acute hypoxic respiratory failure - Secondary to  acute asthmatic bronchitis/exacerbation and acute systolic heart failure, PNA.  - Continue oxygen via nasal cannula. Continue IV Lasix - Resolved   Community acquired pneumonia:  - Afebrile, no leukocytosis.  - Has completed a five-day course o Rocephin and Zithromax- not currently on any antibiotics - Blood cultures 12/18- negative - Sputum culture 12/19-negative - Influenza PCR negative - HIV negative - Urine strep pneumo antigen negative, Legionella antigen negative.  Asthma with exacerbation -Continue nebulized bronchodilators,initially on solumedrol now transitioned to prednisone-will taper. -incentive spirometry and flutter  valve.  Acute Systolic CHF -On IV Lasix-per Nephrology. Significant improvement.  -Metoprolol change to coreg.  -No ACEI/ARB given CKD  -daily weights -strict I&O's -Cath with no significant CAD.    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 Will follow renal recommendation.  -HIV negative, acute hepatitis panel negative - ANA negative - Nephrology evaluation appreciated  Diabetes - CBGs reviewed, sugars uncontrolled likely secondary to steroids. Hemoglobin A1c 10.0 - c/w Lantus 20 units each bedtime, c/w NovoLog 10 units 3 times a day. Continue with SSI.   Hypertension - Controlled with IV lasix,Hydralazine/Imdur/Metoprolol -c/w coreg.    Nausea with vomiting - Resolved, continue with supportive care.  Anemia -suspect secondary to CKD - Anemia workup consistent with anemia of chronic disease Dyslipidemia - Continue with statins  Disposition: Remain inpatient-  DVT Prophylaxis: Prophylactic Lovenox   Code Status: Full code   Family Communication None at bedside  Procedures:  None  CONSULTS: Cardiology, D Berry.  Nephrology.   MEDICATIONS: Scheduled Meds: . albuterol  2.5 mg Nebulization BID  . albuterol  5 mg Nebulization Once  . aspirin  81 mg Oral Daily  . atorvastatin  40 mg Oral Daily  . budesonide (PULMICORT) nebulizer solution  0.25 mg Nebulization BID  . carvedilol  12.5 mg Oral BID WC  . chlorpheniramine-HYDROcodone  5 mL Oral Q12H  .  darbepoetin (ARANESP) injection - NON-DIALYSIS  100  mcg Subcutaneous Q Tue-1800  . enoxaparin (LOVENOX) injection  40 mg Subcutaneous Q24H  . furosemide  120 mg Intravenous Q8H  . hydrALAZINE  10 mg Intravenous UD  . hydrALAZINE  37.5 mg Oral Q8H  . insulin aspart  0-20 Units Subcutaneous TID WC  . insulin aspart  0-5 Units Subcutaneous QHS  . insulin aspart  10 Units Subcutaneous TID WC  . insulin glargine  20 Units Subcutaneous QHS  . ipratropium  0.5 mg Nebulization BID  . isosorbide dinitrate  20 mg Oral TID  . potassium chloride  20 mEq Oral BID  . predniSONE  30 mg Oral Q breakfast   Continuous Infusions:   PRN Meds:.acetaminophen, acetaminophen, albuterol, morphine injection, ondansetron (ZOFRAN) IV, ondansetron (ZOFRAN) IV, sodium chloride  Antibiotics: Anti-infectives   Start     Dose/Rate Route Frequency Ordered Stop   09/16/13 1000  azithromycin (ZITHROMAX) tablet 500 mg     500 mg Oral Daily 09/16/13 0946 09/19/13 1122   09/16/13 0600  cefTRIAXone (ROCEPHIN) 1 g in dextrose 5 % 50 mL IVPB  Status:  Discontinued     1 g 100 mL/hr over 30 Minutes Intravenous Every 24 hours 09/15/13 2000 09/19/13 0910   09/15/13 1815  cefTRIAXone (ROCEPHIN) 1 g in dextrose 5 % 50 mL IVPB  Status:  Discontinued     1 g 100 mL/hr over 30 Minutes Intravenous Every 24 hours 09/15/13 1813 09/15/13 1959   09/15/13 1815  azithromycin (ZITHROMAX) 500 mg in dextrose 5 % 250 mL IVPB  Status:  Discontinued     500 mg 250 mL/hr over 60 Minutes Intravenous Every 24 hours 09/15/13 1813 09/16/13 0945   09/15/13 1645  cefTRIAXone (ROCEPHIN) 1 g in dextrose 5 % 50 mL IVPB     1 g 100 mL/hr over 30 Minutes Intravenous  Once 09/15/13 1642 09/15/13 1844   09/15/13 1645  azithromycin (ZITHROMAX) 500 mg in dextrose 5 % 250 mL IVPB  Status:  Discontinued     500 mg 250 mL/hr over 60 Minutes Intravenous  Once 09/15/13 1642 09/16/13 1020       PHYSICAL EXAM: Vital signs in last 24 hours: Filed Vitals:    09/20/13 2040 09/21/13 0056 09/21/13 0447 09/21/13 0500  BP: 159/96 137/88 155/99   Pulse: 114 91 86   Temp: 97.5 F (36.4 C) 97.7 F (36.5 C) 98 F (36.7 C)   TempSrc: Oral Oral Oral   Resp: 16 18 20    Height:      Weight:  141.2 kg (311 lb 4.6 oz)  141.2 kg (311 lb 4.6 oz)  SpO2: 96% 96% 96%     Weight change: 2.717 kg (5 lb 15.8 oz) Filed Weights   09/20/13 0520 09/21/13 0056 09/21/13 0500  Weight: 140.8 kg (310 lb 6.5 oz) 141.2 kg (311 lb 4.6 oz) 141.2 kg (311 lb 4.6 oz)   Body mass index is 47.34 kg/(m^2).   Gen Exam: Awake and alert with clear speech.  Not in acute distress. Neck: Supple, No JVD.  Chest: Good air entry bilaterally, clear lungs today CVS: S1 S2 Regular, no murmurs.  Abdomen: soft, BS +, non tender, non distended.  Extremities: 1+ edema, lower extremities warm to touch. Neurologic: Non Focal.   Skin: No Rash.   Wounds: N/A.   Intake/Output from previous day:  Intake/Output Summary (Last 24 hours) at 09/21/13 0805 Last data filed at 09/21/13 0600  Gross per 24 hour  Intake    360 ml  Output  1300 ml  Net   -940 ml     LAB RESULTS: CBC  Recent Labs Lab 09/15/13 1244 09/15/13 2020 09/17/13 0850 09/18/13 0422  WBC 4.0 7.7 13.0* 11.9*  HGB 9.9* 9.0* 9.9* 9.5*  HCT 30.0* 26.8* 30.5* 29.3*  PLT 312 263 374 428*  MCV 82.4 82.0 83.1 84.0  MCH 27.2 27.5 27.0 27.2  MCHC 33.0 33.6 32.5 32.4  RDW 13.6 13.6 13.6 13.9  LYMPHSABS 1.8  --   --   --   MONOABS 0.3  --   --   --   EOSABS 0.0  --   --   --   BASOSABS 0.0  --   --   --     Chemistries   Recent Labs Lab 09/17/13 0850 09/18/13 0422 09/19/13 0506 09/20/13 0508 09/21/13 0440  NA 130* 133* 133* 135 133*  K 4.3 4.4 3.6 4.4 3.9  CL 99 100 99 101 99  CO2 18* 21 23 24 23   GLUCOSE 321* 248* 177* 214* 187*  BUN 43* 49* 47* 51* 52*  CREATININE 2.93* 3.10* 2.86* 2.82* 2.75*  CALCIUM 8.1* 7.8* 8.3* 8.4 8.3*    CBG:  Recent Labs Lab 09/19/13 1709 09/19/13 2150 09/20/13 0756  09/20/13 1008 09/20/13 2147  GLUCAP 289* 318* 168* 161* 314*    GFR Estimated Creatinine Clearance: 43.1 ml/min (by C-G formula based on Cr of 2.75).  Coagulation profile  Recent Labs Lab 09/20/13 1020  INR 1.01    Cardiac Enzymes No results found for this basename: CK, CKMB, TROPONINI, MYOGLOBIN,  in the last 168 hours  No components found with this basename: POCBNP,  No results found for this basename: DDIMER,  in the last 72 hours No results found for this basename: HGBA1C,  in the last 72 hours No results found for this basename: CHOL, HDL, LDLCALC, TRIG, CHOLHDL, LDLDIRECT,  in the last 72 hours No results found for this basename: TSH, T4TOTAL, FREET3, T3FREE, THYROIDAB,  in the last 72 hours  Recent Labs  09/18/13 1525  FERRITIN 630*  TIBC 172*  IRON 64   No results found for this basename: LIPASE, AMYLASE,  in the last 72 hours  Urine Studies No results found for this basename: UACOL, UAPR, USPG, UPH, UTP, UGL, UKET, UBIL, UHGB, UNIT, UROB, ULEU, UEPI, UWBC, URBC, UBAC, CAST, CRYS, UCOM, BILUA,  in the last 72 hours  MICROBIOLOGY: Recent Results (from the past 240 hour(s))  CULTURE, BLOOD (ROUTINE X 2)     Status: None   Collection Time    09/15/13  8:20 PM      Result Value Range Status   Specimen Description BLOOD ARM LEFT   Final   Special Requests BOTTLES DRAWN AEROBIC ONLY 5CC   Final   Culture  Setup Time     Final   Value: 09/16/2013 02:08     Performed at [facility]   Culture     Final   Value:        BLOOD CULTURE RECEIVED NO GROWTH TO DATE CULTURE WILL BE HELD FOR 5 DAYS BEFORE ISSUING A FINAL NEGATIVE REPORT     Performed at [facility]   Report Status PENDING   Incomplete  CULTURE, BLOOD (ROUTINE X 2)     Status: None   Collection Time    09/15/13  8:30 PM      Result Value Range Status   Specimen Description BLOOD ARM LEFT   Final   Special Requests BOTTLES  DRAWN AEROBIC ONLY 2CC   Final   Culture  Setup Time     Final    Value: 09/16/2013 02:08     Performed at [facility]   Culture     Final   Value:        BLOOD CULTURE RECEIVED NO GROWTH TO DATE CULTURE WILL BE HELD FOR 5 DAYS BEFORE ISSUING A FINAL NEGATIVE REPORT     Performed at [facility]   Report Status PENDING   Incomplete  CULTURE, EXPECTORATED SPUTUM-ASSESSMENT     Status: None   Collection Time    09/15/13 11:06 PM      Result Value Range Status   Specimen Description SPUTUM   Final   Special Requests NONE   Final   Sputum evaluation     Final   Value: MICROSCOPIC FINDINGS SUGGEST THAT THIS SPECIMEN IS NOT REPRESENTATIVE OF LOWER RESPIRATORY SECRETIONS. PLEASE RECOLLECT.     CALLED TO A.THOMPSON,RN 0020 09/16/13 M.CAMPBELL   Report Status 09/16/2013 FINAL   Final  CULTURE, EXPECTORATED SPUTUM-ASSESSMENT     Status: None   Collection Time    09/16/13 10:20 PM      Result Value Range Status   Specimen Description SPUTUM   Final   Special Requests NONE   Final   Sputum evaluation     Final   Value: THIS SPECIMEN IS ACCEPTABLE. RESPIRATORY CULTURE REPORT TO FOLLOW.   Report Status 09/17/2013 FINAL   Final  CULTURE, RESPIRATORY (NON-EXPECTORATED)     Status: None   Collection Time    09/16/13 10:20 PM      Result Value Range Status   Specimen Description SPUTUM   Final   Special Requests NONE   Final   Gram Stain     Final   Value: MODERATE WBC PRESENT,BOTH PMN AND MONONUCLEAR     NO SQUAMOUS EPITHELIAL CELLS SEEN     NO ORGANISMS SEEN     Performed at [facility]   Culture     Final   Value: NORMAL OROPHARYNGEAL FLORA     Performed at [facility]   Report Status 09/19/2013 FINAL   Final    RADIOLOGY STUDIES/RESULTS: Dg Chest 2 View  09/15/2013   CLINICAL DATA:  Cough for 2-1/2 weeks with shortness of breath and fever at times.  EXAM: CHEST  2 VIEW  COMPARISON:  09/16/2013  FINDINGS: Lungs are adequately inflated with bilateral patchy airspace opacification most prominent over the left base  with mild overall worsening. Findings are likely due to infection. No evidence of effusion. Mild stable cardiomegaly. Remainder of the exam is unchanged.  IMPRESSION: Mild worsening of a patchy bilateral airspace process worse over the left base likely due to infection.  Mild stable cardiomegaly.   Electronically Signed   By: Elberta Fortis M.D.   On: 09/15/2013 15:58   Dg Chest 2 View  09/06/2013   CLINICAL DATA:  Cough, fever.  EXAM: CHEST  2 VIEW  COMPARISON:  None.  FINDINGS: The heart size and mediastinal contours are within normal limits. No pneumothorax or pleural effusion is noted. Bilateral peribronchial thickening is noted concerning for bronchitis or possibly perihilar pneumonitis. The visualized skeletal structures are unremarkable.  IMPRESSION: Findings consistent with either bilateral bronchitis or perihilar pneumonitis.   Electronically Signed   By: Roque Lias M.D.   On: 09/06/2013 11:07    May Ozment, MD  [facility] Pager:[phone]  If 7PM-7AM, please contact night-coverage [email or web address] Password [email or web address] 09/21/2013,  8:05 AM   LOS: 6 days

## 2013-09-22 LAB — CBC
HCT: 28.6 % — ABNORMAL LOW (ref 36.0–46.0)
Hemoglobin: 9.1 g/dL — ABNORMAL LOW (ref 12.0–15.0)
MCH: 26.7 pg (ref 26.0–34.0)
Platelets: 446 10*3/uL — ABNORMAL HIGH (ref 150–400)
RBC: 3.41 MIL/uL — ABNORMAL LOW (ref 3.87–5.11)
RDW: 14.5 % (ref 11.5–15.5)
WBC: 12.3 10*3/uL — ABNORMAL HIGH (ref 4.0–10.5)

## 2013-09-22 LAB — RENAL FUNCTION PANEL
Albumin: 1.7 g/dL — ABNORMAL LOW (ref 3.5–5.2)
BUN: 57 mg/dL — ABNORMAL HIGH (ref 6–23)
CO2: 23 mEq/L (ref 19–32)
Chloride: 99 mEq/L (ref 96–112)
GFR calc Af Amer: 25 mL/min — ABNORMAL LOW (ref >90)
Glucose, Bld: 219 mg/dL — ABNORMAL HIGH (ref 70–99)
Phosphorus: 5.5 mg/dL — ABNORMAL HIGH (ref 2.3–4.6)
Potassium: 4.4 mEq/L (ref 3.5–5.1)
Sodium: 132 mEq/L — ABNORMAL LOW (ref 135–145)

## 2013-09-22 LAB — CULTURE, BLOOD (ROUTINE X 2)
Culture: NO GROWTH
Culture: NO GROWTH

## 2013-09-22 LAB — GLUCOSE, CAPILLARY: Glucose-Capillary: 198 mg/dL — ABNORMAL HIGH (ref 70–99)

## 2013-09-22 MED ORDER — INSULIN ASPART 100 UNIT/ML ~~LOC~~ SOLN: 10.0000 [IU] | Freq: Three times a day (TID) | SUBCUTANEOUS | Status: DC

## 2013-09-22 MED ORDER — POTASSIUM CHLORIDE 20 MEQ/15ML (10%) PO LIQD
20.0000 meq | Freq: Every day | ORAL | Status: DC
  Administered 2013-09-22: 20 meq via ORAL
  Filled 2013-09-22: qty 15

## 2013-09-22 MED ORDER — INSULIN GLARGINE 100 UNIT/ML ~~LOC~~ SOLN: 20.0000 [IU] | Freq: Every day | SUBCUTANEOUS | Status: DC

## 2013-09-22 MED ORDER — HYDRALAZINE HCL 25 MG PO TABS: 37.5000 mg | ORAL_TABLET | Freq: Three times a day (TID) | ORAL | Status: DC

## 2013-09-22 MED ORDER — BUDESONIDE 0.25 MG/2ML IN SUSP: 0.2500 mg | Freq: Two times a day (BID) | RESPIRATORY_TRACT | Status: DC

## 2013-09-22 MED ORDER — PREDNISONE 10 MG PO TABS: ORAL_TABLET | ORAL | Status: DC

## 2013-09-22 MED ORDER — CARVEDILOL 25 MG PO TABS: ORAL_TABLET | ORAL | Status: DC

## 2013-09-22 MED ORDER — POTASSIUM CHLORIDE 20 MEQ/15ML (10%) PO LIQD: 20.0000 meq | Freq: Every day | ORAL | Status: DC

## 2013-09-22 MED ORDER — ISOSORBIDE DINITRATE 20 MG PO TABS: 20.0000 mg | ORAL_TABLET | Freq: Three times a day (TID) | ORAL | Status: DC

## 2013-09-22 MED ORDER — FUROSEMIDE 80 MG PO TABS: 80.0000 mg | ORAL_TABLET | Freq: Two times a day (BID) | ORAL | Status: DC

## 2013-09-22 MED ORDER — FUROSEMIDE 80 MG PO TABS
80.0000 mg | ORAL_TABLET | Freq: Two times a day (BID) | ORAL | Status: DC
  Filled 2013-09-22 (×3): qty 1

## 2013-09-22 MED ORDER — CARVEDILOL 12.5 MG PO TABS: 12.5000 mg | ORAL_TABLET | Freq: Two times a day (BID) | ORAL | Status: DC

## 2013-09-22 NOTE — Discharge Summary (Signed)
Physician Discharge Summary  [name] NGE:[id number] DOB: 11/14/1980 DOA: 09/15/2013  PCP: No PCP Per Patient  Admit date: 09/15/2013 Discharge date: 09/22/2013  Time spent: 35 minutes  Recommendations for Outpatient Follow-up:  1. Follow with nephrologist, need B-met to follow renal function.  2. Needs to follow with cardio for further adjustment of medications. Need repeat ECHO probably in 3 months.   Discharge Diagnoses:    Acute combined systolic and diastolic heart failure   Community acquired pneumonia   Nausea and vomiting   Diabetes mellitus- uncontrolled   Normocytic anemia   Acute renal insuficency   Dehydration   Hyperlipidemia   Hypertension   Asthma exacerbation   Non-ischemic cardiomyopathy - false positive Myoview- EF 45-40%   Morbid obesity- BMI 48   Discharge Condition: stable.   Diet recommendation: Heart healthy.   Filed Weights   09/21/13 0056 09/21/13 0500 09/22/13 0024  Weight: 141.2 kg (311 lb 4.6 oz) 141.2 kg (311 lb 4.6 oz) 141.8 kg (312 lb 9.8 oz)    History of present illness:  [name] is an 32 y.o. female with a PMH of asthma, HTN, DM who originally presented to [facility] on 09/02/13 with dyspnea and cough and then presented to the ER on 09/06/13 with persistent symptoms. CXR done on that day showed bronchitis versus a perihilar pneumonitis. She was started on prednisone and instructed to complete her previously prescribed course of Levaquin. She now returns after completing her Levaquin with complaints of persistent symptoms including nausea, vomiting, diarrhea, lightheadedness, persistent shortness of breath and cough. She has used her home MDI and nebulizer with mild improvement in her shortness of breath, but no change in the cough. She is having posttussive emesis with coughing spells. She has had decreased oral intake. Upon evaluation in the ED, the patient was noted to have acute renal failure with creatinine of 2.72. Chest x-ray reveals mild  worsening of the patchy bilateral airspace process, worse over the left base, likely due to infection. She has not had a flu shot this year, and denies sick contacts   Hospital Course:  Patient is a 32 year old African American female with a history of chronic kidney disease stage III-, diabetes, hypertension who presented to the ED on 5/18 with cough, wheezing along with shortness of breath. She was also vomiting. Chest x-ray done on admission showed patchy bilateral airspace disease In the ED she was found to have an elevated creatinine of 2.72, she was then admitted for further evaluation and treatment. Her hospitalization has been complicated by development of pulmonary edema secondary to acute systolic heart failure, fluid overload, acute on chronic kidney disease. A nuclear stress test done during this hospitalization was positive for anterior wall ischemia. She was treated with  IV Lasix, patient is much more compensated, she has completed a course of intravenous antibiotics. Her creatinine has stabilized. She has improved significantly. Patient S/P cardiac cath which show minimal CAD. Plan is for medical management. She is stable for discharge. She will be discharge on lasix 80 mg BID and potasium supplement. Her coreg dose was increase to 25 mg BID. She will be discharge on prednisone taper dose.   Acute hypoxic respiratory failure  - Secondary to acute asthmatic bronchitis/exacerbation and acute systolic heart failure, PNA.  - Continue oxygen via nasal cannula. Resolved with IV lasix, prednisone and antibiotics.   Community acquired pneumonia:  - Afebrile, no leukocytosis.  - Has completed a five-day course o Rocephin and Zithromax- not currently on any antibiotics  -  Blood cultures 12/18- negative  - Sputum culture 12/19-negative  - Influenza PCR negative  - HIV negative  - Urine strep pneumo antigen negative, Legionella antigen negative.   Asthma with exacerbation  -Continue nebulized  bronchodilators,initially on solumedrol now transitioned to prednisone-will taper.  -incentive spirometry and flutter valve.   Acute Systolic CHF : Ef 35 to 40 % by ECHO.  -She was treated with  IV Lasix-per Nephrology. Significant improvement.  -Metoprolol change to coreg.  -No ACEI/ARB given CKD  -daily weights  -strict I&O's  -Cath with no significant CAD.   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 Will follow renal recommendation.  -HIV negative, acute hepatitis panel negative  - ANA negative  - Nephrology evaluation appreciated  Diabetes  - CBGs reviewed, sugars uncontrolled likely secondary to steroids. Hemoglobin A1c 10.0  - c/w Lantus 20 units each bedtime, c/w NovoLog 10 units 3 times a day. Continue with SSI.  Hypertension  - Controlled with IV lasix,Hydralazine/Imdur/Metoprolol  -c/w coreg.  Nausea with vomiting  - Resolved, continue with supportive care.  Anemia  -suspect secondary to CKD  - Anemia workup consistent with anemia of chronic disease  Dyslipidemia  - Continue with statins   Procedures: cardiac cath which show minimal CAD ECHO; - Left ventricle: Akinesis base/mid inferior segments. Other walls hypo. Apex moves the best. The cavity size was mildly to moderately dilated. Wall thickness was increased in a pattern of mild LVH. Systolic function was moderately reduced. The estimated ejection fraction was in the range of 35% to 40%. - Left atrium:  The atrium was mildly dilated. - Right ventricle: The cavity size was normal. Systolic function was normal. - Pericardium, extracardiac: A small pericardial effusion  Consultations:  Cardiology  nephrology  Discharge Exam: Filed Vitals:   09/22/13 0528  BP: 166/95  Pulse: 102  Temp: 98.1 F (36.7 C)  Resp: 20    General: No distress.  Cardiovascular: S 1, S 2 RRR Respiratory: CTA  Discharge Instructions  Discharge Orders   Future Appointments Provider Department Dept Phone   10/25/2013 12:00 PM Doris Cheadle, MD [facility] [phone]   Future Orders Complete By Expires   Diet - low sodium heart healthy  As directed    Increase activity slowly  As directed        Medication List    STOP taking these medications       amLODipine 10 MG tablet  Commonly known as:  NORVASC     DM-Doxylamine-Acetaminophen 15-6.25-325 MG/15ML Liqd     hydrochlorothiazide 25 MG tablet  Commonly known as:  HYDRODIURIL     NOVOLOG FLEXPEN 100 UNIT/ML Sopn FlexPen  Generic drug:  insulin aspart  Replaced by:  insulin aspart 100 UNIT/ML injection     spironolactone 25 MG tablet  Commonly known as:  ALDACTONE      TAKE these medications       albuterol 108 (90 BASE) MCG/ACT inhaler  Commonly known as:  PROVENTIL HFA;VENTOLIN HFA  Inhale into the lungs  every 6 (six) hours as needed for wheezing or shortness of breath.     albuterol (2.5 MG/3ML) 0.083% nebulizer solution  Commonly known as:  PROVENTIL  Take 2.5 mg by nebulization every 6 (six) hours as needed for wheezing or shortness of breath.     atorvastatin 40 MG tablet  Commonly known as:  LIPITOR  Take 40 mg by mouth daily.     budesonide 0.25 MG/2ML nebulizer solution  Commonly known as:  PULMICORT  Take 2 mLs (0.25 mg total) by nebulization 2 (two) times daily.     carvedilol 25 MG tablet  Commonly known as:  COREG  1 tablet PO BID.     furosemide 80 MG tablet  Commonly known as:   LASIX  Take 1 tablet (80 mg total) by mouth 2 (two) times daily.     hydrALAZINE 25 MG tablet  Commonly known as:  APRESOLINE  Take 1.5 tablets (37.5 mg total) by mouth every 8 (eight) hours.     insulin aspart 100 UNIT/ML injection  Commonly known as:  novoLOG  Inject 10 Units into the skin 3 (three) times daily with meals.     insulin glargine 100 UNIT/ML injection  Commonly known as:  LANTUS  Inject 0.2 mLs (20 Units total) into the skin at bedtime.     isosorbide dinitrate 20 MG tablet  Commonly known as:  ISORDIL  Take 1 tablet (20 mg total) by mouth 3 (three) times daily.     potassium chloride 20 MEQ/15ML (10%) solution  Take 15 mLs (20 mEq total) by mouth daily.     predniSONE 10 MG tablet  Commonly known as:  DELTASONE  Take 3 tablet for one day the 2 tablet for one day then stop.       Allergies  Allergen Reactions  . Aspirin Anaphylaxis  . Sulfur Hives  . Tramadol Other (See Comments)    Pt states she feels weird        Follow-up Information   Follow up with [facility] On 10/25/2013. (12, they will give you a form to change your medicaid card to [place])    Contact information:   [address] [place] [address] [phone]      Follow up with Runell Gess, MD In 1 week.   Specialty:  Cardiology   Contact information:   [address] [place] [address] [phone]       Follow up with Annie Sable A, MD In 1 week.   Specialty:  Nephrology   Contact information:   [address] [place] [address] [phone]        The results of significant diagnostics from this hospitalization (including imaging, microbiology, ancillary and laboratory) are listed below for reference.    Significant Diagnostic Studies: Dg Chest 2 View  09/15/2013   CLINICAL DATA:  Cough for 2-1/2 weeks with shortness of breath and fever at times.  EXAM: CHEST  2 VIEW  COMPARISON:  09/16/2013  FINDINGS:  Lungs are adequately inflated with bilateral patchy airspace opacification most prominent over the left base with mild overall worsening. Findings are likely due to infection. No evidence of effusion. Mild stable cardiomegaly. Remainder of the exam is unchanged.  IMPRESSION: Mild worsening of a patchy bilateral airspace process worse over the left base likely due to infection.  Mild stable cardiomegaly.   Electronically Signed   By: Elberta Fortis M.D.   On: 09/15/2013 15:58   Dg Chest  2 View  09/06/2013   CLINICAL DATA:  Cough, fever.  EXAM: CHEST  2 VIEW  COMPARISON:  None.  FINDINGS: The heart size and mediastinal contours are within normal limits. No pneumothorax or pleural effusion is noted. Bilateral peribronchial thickening is noted concerning for bronchitis or possibly perihilar pneumonitis. The visualized skeletal structures are unremarkable.  IMPRESSION: Findings consistent with either bilateral bronchitis or perihilar pneumonitis.   Electronically Signed   By: Roque Lias M.D.   On: 09/06/2013 11:07   US Renal  09/17/2013   CLINICAL DATA:  Chronic renal failure now with acute deterioration.  EXAM: RENAL/URINARY TRACT ULTRASOUND COMPLETE  COMPARISON:  None.  FINDINGS: Right Kidney:  Length: 11.1 cm. Echogenicity within normal limits. No mass or hydronephrosis visualized.  Left Kidney:  Length: 10.9 cm. Echogenicity within normal limits. No mass or hydronephrosis visualized.  Bladder:  Appears normal for degree of bladder distention.  IMPRESSION: There is no evidence of hydronephrosis nor of renal parenchymal masses. The echotexture of the renal cortex on the right remains lower than that of the adjacent liver.   Electronically Signed   By: David  [place]   On: 09/17/2013 16:01   Nm Myocar Multi W/spect W/wall Motion / Ef  09/19/2013   CLINICAL DATA:  Chest pain, diabetes, hypertension  EXAM: NUCLEAR MEDICINE MYOCARDIAL PERFUSION IMAGING  NUCLEAR MEDICINE LEFT VENTRICULAR WALL MOTION ANALYSIS   NUCLEAR MEDICINE LEFT VENTRICULAR EJECTION FRACTION CALCULATION  TECHNIQUE: Standard 2day myocardial SPECT imaging was performed after resting intravenous injection of Tc-70m sestamibi. After intravenous infusion of Lexiscan (regadenoson) under supervision of cardiology staff, sestamibiwas injected intravenously and standard myocardial SPECT imaging was performed. Quantitative gated imaging was also performed to evaluate left ventricular wall motion and estimate left ventricular ejection fraction.  Radiopharmaceutical: 30+30 mCi Tc35m sestamibiIV.  COMPARISON:  None  FINDINGS: The stress SPECT images demonstrate left ventricular dilatation. There is a large area of moderately decreased activity involving the anterior wall of the left ventricle extending to involve anterolateral and anteroseptal regions.  Rest images demonstrate relatively improved anterior wall activity, no new perfusion defects.  The gated stress SPECT images demonstrate global hypokinesis. Calculated left ventricular end-diastolic volume , end-systolic volume 142 ml, ejection fraction of 31%.  IMPRESSION: 1. Anterior wall ischemia. 2. Left ventricular dilatation with global hypokinesis. 3. Left ventricular ejection fraction 31%.   Electronically Signed   By: Oley Balm M.D.   On: 09/19/2013 14:32   Dg Chest Port 1 View  09/17/2013   CLINICAL DATA:  Status post right internal jugular venous catheter placement.  EXAM: PORTABLE CHEST - 1 VIEW  COMPARISON:  Portable chest x-ray of earlier today  FINDINGS: The right internal jugular venous catheter tip lies in the region of the proximal to mid SVC. There is no evidence of a postprocedure pneumothorax or hemothorax. The cardiopericardial silhouette remains enlarged. The pulmonary vascularity remains indistinct. Confluent interstitial and alveolar densities are present in both lungs. The left hemidiaphragm remains obscured.  IMPRESSION: There is no evidence of a postprocedure complication  following placement of a right internal jugular venous catheter.   Electronically Signed   By: David  [place]   On: 09/17/2013 14:52   Dg Chest Port 1 View  09/17/2013   CLINICAL DATA:  Short of breath.  EXAM: PORTABLE CHEST - 1 VIEW  COMPARISON:  09/15/2013.  FINDINGS: Lung volumes are lower than on prior. The cardiopericardial silhouette remains enlarged. Diffuse airspace opacity is present with basilar predominant compatible with CHF and pulmonary edema.  IMPRESSION: Worsening CHF, now moderate.   Electronically Signed   By: Andreas Newport M.D.   On: 09/17/2013 07:44    Microbiology: Recent Results (from the past 240 hour(s))  CULTURE, BLOOD (ROUTINE X 2)     Status: None   Collection Time    09/15/13  8:20 PM      Result Value Range Status   Specimen Description BLOOD ARM LEFT   Final   Special Requests BOTTLES DRAWN AEROBIC ONLY 5CC   Final   Culture  Setup Time     Final   Value: 09/16/2013 02:08     Performed at [facility]   Culture     Final   Value: NO GROWTH 5 DAYS     Performed at [facility]   Report Status 09/22/2013 FINAL   Final  CULTURE, BLOOD (ROUTINE X 2)     Status: None   Collection Time    09/15/13  8:30 PM      Result Value Range Status   Specimen Description BLOOD ARM LEFT   Final   Special Requests BOTTLES DRAWN AEROBIC ONLY 2CC   Final   Culture  Setup Time     Final   Value: 09/16/2013 02:08     Performed at [facility]   Culture     Final   Value: NO GROWTH 5 DAYS     Performed at [facility]   Report Status 09/22/2013 FINAL   Final  CULTURE, EXPECTORATED SPUTUM-ASSESSMENT     Status: None   Collection Time    09/15/13 11:06 PM      Result Value Range Status   Specimen Description SPUTUM   Final   Special Requests NONE   Final   Sputum evaluation     Final   Value: MICROSCOPIC FINDINGS SUGGEST THAT THIS SPECIMEN IS NOT REPRESENTATIVE OF LOWER RESPIRATORY SECRETIONS. PLEASE RECOLLECT.     CALLED TO A.THOMPSON,RN  0020 09/16/13 M.CAMPBELL   Report Status 09/16/2013 FINAL   Final  CULTURE, EXPECTORATED SPUTUM-ASSESSMENT     Status: None   Collection Time    09/16/13 10:20 PM      Result Value Range Status   Specimen Description SPUTUM   Final   Special Requests NONE   Final   Sputum evaluation     Final   Value: THIS SPECIMEN IS ACCEPTABLE. RESPIRATORY CULTURE REPORT TO FOLLOW.   Report Status 09/17/2013 FINAL   Final  CULTURE, RESPIRATORY (NON-EXPECTORATED)     Status: None   Collection Time    09/16/13 10:20 PM      Result Value Range Status   Specimen Description SPUTUM   Final   Special Requests NONE   Final   Gram Stain     Final   Value: MODERATE WBC PRESENT,BOTH PMN AND MONONUCLEAR     NO SQUAMOUS EPITHELIAL CELLS SEEN     NO ORGANISMS SEEN     Performed at [facility]   Culture     Final   Value: NORMAL OROPHARYNGEAL FLORA     Performed at [facility]   Report Status 09/19/2013 FINAL   Final     Labs: Basic Metabolic Panel:  Recent Labs Lab 09/18/13 0422 09/18/13 1525 09/19/13 0506 09/20/13 0508 09/21/13 0440 09/22/13 0520  NA 133*  --  133* 135 133* 132*  K 4.4  --  3.6 4.4 3.9 4.4  CL 100  --  99 101 99 99  CO2 21  --  23 24 23 23   GLUCOSE 248*  --  177* 214* 187* 219*  BUN 49*  --  47* 51* 52* 57*  CREATININE 3.10*  --  2.86* 2.82* 2.75* 2.76*  CALCIUM 7.8*  --  8.3* 8.4 8.3* 7.9*  PHOS  --  5.2* 5.2* 5.5* 5.3* 5.5*   Liver Function Tests:  Recent Labs Lab 09/15/13 1244 09/19/13 0506 09/20/13 0508 09/21/13 0440 09/22/13 0520  AST 20  --   --   --   --   ALT 13  --   --   --   --   ALKPHOS 76  --   --   --   --   BILITOT 0.2*  --   --   --   --   PROT 6.1  --   --   --   --   ALBUMIN 1.4* 1.5* 1.6* 1.6* 1.7*   No results found for this basename: LIPASE, AMYLASE,  in the last 168 hours No results found for this basename: AMMONIA,  in the last 168 hours CBC:  Recent Labs Lab 09/15/13 1244 09/15/13 2020 09/17/13 0850  09/18/13 0422 09/22/13 0519  WBC 4.0 7.7 13.0* 11.9* 12.3*  NEUTROABS 2.0  --   --   --   --   HGB 9.9* 9.0* 9.9* 9.5* 9.1*  HCT 30.0* 26.8* 30.5* 29.3* 28.6*  MCV 82.4 82.0 83.1 84.0 83.9  PLT 312 263 374 428* 446*   Cardiac Enzymes: No results found for this basename: CKTOTAL, CKMB, CKMBINDEX, TROPONINI,  in the last 168 hours BNP: BNP (last 3 results)  Recent Labs  09/15/13 2020 09/18/13 0422  PROBNP 7879.0* 11984.0*   CBG:  Recent Labs Lab 09/21/13 0806 09/21/13 1252 09/21/13 1724 09/21/13 2151 09/22/13 0806  GLUCAP 165* 105* 188* 200* 198*       Signed:  Kosisochukwu Burningham  [facility] 09/22/2013, 11:19 AM

## 2013-09-22 NOTE — Plan of Care (Signed)
Problem: Consults Goal: Heart Failure Patient Education (See Patient Education module for education specifics.)  Outcome: Not Met (add Reason) Patient not receptive; in denial.

## 2013-09-22 NOTE — Progress Notes (Signed)
Patient denies CHF.  Will consult with MD before proceeding further with education.  It would be helpful if MD can address this with patient as well.

## 2013-09-22 NOTE — Progress Notes (Signed)
[name] Progress Note    Subjective: Pt doing well this AM, hoping to be discharged.  Creatinine has been stable and is still diuresing.     Exam  Blood pressure 166/95, pulse 102, temperature 98.1 F (36.7 C), temperature source Oral, resp. rate 20, height 5\' 8"  (1.727 m), weight 141.8 kg (312 lb 9.8 oz), last menstrual period 09/08/2013, SpO2 96.00%. gen: alert, no distress neck: + jvd, R ij line in place  chest: bilateral rales 1/3 up the post lung fields  cor: regular, no gallop or rub, no M, pedal pulses intact  abd: soft, obese, nontender, no HSM  ext: 2+ edema bilat LE's neuro: alert, ox3, nonfocal   CXR > LLL infiltrate  EF 35-40% by ECHO UA  >300 proteinuria and 3-6 rbc/hpf Renal US > 10.9/11.1 cm kidneys, no hydro  Assessment/Plan:  1. CKD stage IV: pt was seeing a nephrologist in [place] before moving here. Prob due to DM and HTN. She has had DM for 23 years and HTN for about 10. eGFR 20 mL/min. Save L arm. No immediate indications for dialysis. Likely we'll not pursue access  this hospitalization.   We'll arrange nephrology outpatient followup here at time of discharge. She tolerated cath well.  2. Volume overload / pulm edema: continue IV lasix 120 q 8- diuresing well but not yet at dry weight. Blood pressure still up but could be some do to steroids. Have increased metoprolol.  Will continue  low dose potassium replacement. Will change lasix to 80 BID and change K to 20 daily- this should be her d/c meds 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 Anemia / CKD: Hb 9.9. Iron sat is good. Have given 1 dose of Aranesp 5. DM2- onset age 39 , 23 yrs duration, hx retinopathy 6. Cardiomyopathy, EF 35-40%: cardiology following, WMA by echo. Likely skewed in the setting of acute heart failure. Cardiac dysfunction likely secondary to long-standing and poorly controlled blood pressure. I stressed her the importance of good blood pressure control moving  forward. This has not yet been achieved.  I will arrange renal follow up and sign off, call with any questions    Konya Fauble A   09/22/2013, 9:24 AM   Recent Labs Lab 09/20/13 0508 09/21/13 0440 09/22/13 0520  NA 135 133* 132*  K 4.4 3.9 4.4  CL 101 99 99  CO2 24 23 23   GLUCOSE 214* 187* 219*  BUN 51* 52* 57*  CREATININE 2.82* 2.75* 2.76*  CALCIUM 8.4 8.3* 7.9*  PHOS 5.5* 5.3* 5.5*    Recent Labs Lab 09/15/13 1244  09/20/13 0508 09/21/13 0440 09/22/13 0520  AST 20  --   --   --   --   ALT 13  --   --   --   --   ALKPHOS 76  --   --   --   --   BILITOT 0.2*  --   --   --   --   PROT 6.1  --   --   --   --   ALBUMIN 1.4*  < > 1.6* 1.6* 1.7*  < > = values in this interval not displayed.  Recent Labs Lab 09/15/13 1244  09/17/13 0850 09/18/13 0422 09/22/13 0519  WBC 4.0  < > 13.0* 11.9* 12.3*  NEUTROABS 2.0  --   --   --   --   HGB 9.9*  < > 9.9* 9.5* 9.1*  HCT  30.0*  < > 30.5* 29.3* 28.6*  MCV 82.4  < > 83.1 84.0 83.9  PLT 312  < > 374 428* 446*  < > = values in this interval not displayed. Marland Kitchen albuterol  5 mg Nebulization Once  . aspirin  81 mg Oral Daily  . atorvastatin  40 mg Oral Daily  . budesonide (PULMICORT) nebulizer solution  0.25 mg Nebulization BID  . carvedilol  12.5 mg Oral BID WC  . chlorpheniramine-HYDROcodone  5 mL Oral Q12H  . darbepoetin (ARANESP) injection - NON-DIALYSIS  100 mcg Subcutaneous Q Tue-1800  . enoxaparin (LOVENOX) injection  40 mg Subcutaneous Q24H  . furosemide  120 mg Intravenous Q8H  . hydrALAZINE  10 mg Intravenous UD  . hydrALAZINE  37.5 mg Oral Q8H  . insulin aspart  0-20 Units Subcutaneous TID WC  . insulin aspart  0-5 Units Subcutaneous QHS  . insulin aspart  10 Units Subcutaneous TID WC  . insulin glargine  20 Units Subcutaneous QHS  . isosorbide dinitrate  20 mg Oral TID  . potassium chloride  20 mEq Oral BID  . predniSONE  30 mg Oral Q breakfast     acetaminophen, acetaminophen, albuterol, morphine  injection, ondansetron (ZOFRAN) IV, ondansetron (ZOFRAN) IV, sodium chloride

## 2013-09-22 NOTE — Progress Notes (Signed)
Subjective:  Breathing better   Objective:  Temp:  [97.7 F (36.5 C)-98.1 F (36.7 C)] 98.1 F (36.7 C) (12/25 0528) Pulse Rate:  [92-108] 102 (12/25 0528) Resp:  [18-20] 20 (12/25 0528) BP: (118-166)/(69-95) 166/95 mmHg (12/25 0528) SpO2:  [96 %-98 %] 96 % (12/25 0528) Weight:  [312 lb 9.8 oz (141.8 kg)] 312 lb 9.8 oz (141.8 kg) (12/25 0024) Weight change: 1 lb 5.2 oz (0.6 kg)  Intake/Output from previous day: 12/24 0701 - 12/25 0700 In: 720 [P.O.:720] Out: 2100 [Urine:2100]  Intake/Output from this shift:    Physical Exam: General appearance: alert and no distress Neck: no adenopathy, no carotid bruit, no JVD, supple, symmetrical, trachea midline and thyroid not enlarged, symmetric, no tenderness/mass/nodules Lungs: clear to auscultation bilaterally Heart: regular rate and rhythm, S1, S2 normal, no murmur, click, rub or gallop Extremities: extremities normal, atraumatic, no cyanosis or edema  Lab Results: Results for orders placed during the hospital encounter of 09/15/13 (from the past 48 hour(s))  GLUCOSE, CAPILLARY     Status: Abnormal   Collection Time    09/20/13 10:08 AM      Result Value Range   Glucose-Capillary 161 (*) 70 - 99 mg/dL   Comment 1 Documented in Chart     Comment 2 Notify RN    PROTIME-INR     Status: None   Collection Time    09/20/13 10:20 AM      Result Value Range   Prothrombin Time 13.1  11.6 - 15.2 seconds   INR 1.01  0.00 - 1.49  GLUCOSE, CAPILLARY     Status: Abnormal   Collection Time    09/20/13  9:47 PM      Result Value Range   Glucose-Capillary 314 (*) 70 - 99 mg/dL   Comment 1 Documented in Chart     Comment 2 TERESA RN    RENAL FUNCTION PANEL     Status: Abnormal   Collection Time    09/21/13  4:40 AM      Result Value Range   Sodium 133 (*) 135 - 145 mEq/L   Potassium 3.9  3.5 - 5.1 mEq/L   Chloride 99  96 - 112 mEq/L   CO2 23  19 - 32 mEq/L   Glucose, Bld 187 (*) 70 - 99 mg/dL   BUN 52 (*) 6 - 23 mg/dL   Creatinine, Ser 1.61 (*) 0.50 - 1.10 mg/dL   Calcium 8.3 (*) 8.4 - 10.5 mg/dL   Phosphorus 5.3 (*) 2.3 - 4.6 mg/dL   Albumin 1.6 (*) 3.5 - 5.2 g/dL   GFR calc non Af Amer 21 (*) >90 mL/min   GFR calc Af Amer 25 (*) >90 mL/min   Comment: (NOTE)     The eGFR has been calculated using the CKD EPI equation.     This calculation has not been validated in all clinical situations.     eGFR's persistently <90 mL/min signify possible Chronic Kidney     Disease.  GLUCOSE, CAPILLARY     Status: Abnormal   Collection Time    09/21/13  8:06 AM      Result Value Range   Glucose-Capillary 165 (*) 70 - 99 mg/dL  GLUCOSE, CAPILLARY     Status: Abnormal   Collection Time    09/21/13 12:52 PM      Result Value Range   Glucose-Capillary 105 (*) 70 - 99 mg/dL  GLUCOSE, CAPILLARY     Status: Abnormal  Collection Time    09/21/13  5:24 PM      Result Value Range   Glucose-Capillary 188 (*) 70 - 99 mg/dL  GLUCOSE, CAPILLARY     Status: Abnormal   Collection Time    09/21/13  9:51 PM      Result Value Range   Glucose-Capillary 200 (*) 70 - 99 mg/dL   Comment 1 Documented in Chart     Comment 2 TERESA RN    CBC     Status: Abnormal   Collection Time    09/22/13  5:19 AM      Result Value Range   WBC 12.3 (*) 4.0 - 10.5 K/uL   RBC 3.41 (*) 3.87 - 5.11 MIL/uL   Hemoglobin 9.1 (*) 12.0 - 15.0 g/dL   HCT 82.9 (*) 56.2 - 13.0 %   MCV 83.9  78.0 - 100.0 fL   MCH 26.7  26.0 - 34.0 pg   MCHC 31.8  30.0 - 36.0 g/dL   RDW 86.5  78.4 - 69.6 %   Platelets 446 (*) 150 - 400 K/uL  RENAL FUNCTION PANEL     Status: Abnormal   Collection Time    09/22/13  5:20 AM      Result Value Range   Sodium 132 (*) 135 - 145 mEq/L   Potassium 4.4  3.5 - 5.1 mEq/L   Chloride 99  96 - 112 mEq/L   CO2 23  19 - 32 mEq/L   Glucose, Bld 219 (*) 70 - 99 mg/dL   BUN 57 (*) 6 - 23 mg/dL   Creatinine, Ser 2.95 (*) 0.50 - 1.10 mg/dL   Calcium 7.9 (*) 8.4 - 10.5 mg/dL   Phosphorus 5.5 (*) 2.3 - 4.6 mg/dL   Albumin 1.7 (*)  3.5 - 5.2 g/dL   GFR calc non Af Amer 21 (*) >90 mL/min   GFR calc Af Amer 25 (*) >90 mL/min   Comment: (NOTE)     The eGFR has been calculated using the CKD EPI equation.     This calculation has not been validated in all clinical situations.     eGFR's persistently <90 mL/min signify possible Chronic Kidney     Disease.  GLUCOSE, CAPILLARY     Status: Abnormal   Collection Time    09/22/13  8:06 AM      Result Value Range   Glucose-Capillary 198 (*) 70 - 99 mg/dL    Imaging: Imaging results have been reviewed  Assessment/Plan:   1. Principal Problem: 2.   Acute combined systolic and diastolic heart failure 3. Active Problems: 4.   Community acquired pneumonia 5.   Nausea and vomiting 6.   Diabetes mellitus- uncontrolled 7.   Normocytic anemia 8.   Acute renal insuficency 9.   Dehydration 10.   Hyperlipidemia 11.   Hypertension 12.   Asthma exacerbation 13.   Non-ischemic cardiomyopathy - false positive Myoview- EF 45-40% 14.   Morbid obesity- BMI 48 15.   Time Spent Directly with Patient:  20 minutes  Length of Stay:  LOS: 7 days   Looks much better. On high dose lasix per Renal. SCr hasn't increased. Breathing better. BP better. Mod LV dysfunction prob secondary to poorly controlled HTN. Prob OK to go home from our perspective on Coreg (can titrate up given increased BP and HR). Close OP office follow up with [place] (we will arrange) as well as OP 2D to eval improved LV fxn in our office  Roselinda Bahena J  09/22/2013, 9:56 AM

## 2013-10-25 ENCOUNTER — Encounter: Payer: Self-pay | Admitting: Internal Medicine

## 2013-10-25 ENCOUNTER — Ambulatory Visit: Payer: Medicaid Other | Attending: Internal Medicine | Admitting: Internal Medicine

## 2013-10-25 VITALS — BP 152/96 | HR 96 | Temp 98.5°F | Resp 17 | Wt 304.0 lb

## 2013-10-25 DIAGNOSIS — IMO0001 Reserved for inherently not codable concepts without codable children: Secondary | ICD-10-CM | POA: Insufficient documentation

## 2013-10-25 DIAGNOSIS — E1165 Type 2 diabetes mellitus with hyperglycemia: Secondary | ICD-10-CM

## 2013-10-25 DIAGNOSIS — J45909 Unspecified asthma, uncomplicated: Secondary | ICD-10-CM | POA: Insufficient documentation

## 2013-10-25 DIAGNOSIS — J189 Pneumonia, unspecified organism: Secondary | ICD-10-CM

## 2013-10-25 DIAGNOSIS — Z139 Encounter for screening, unspecified: Secondary | ICD-10-CM

## 2013-10-25 DIAGNOSIS — I10 Essential (primary) hypertension: Secondary | ICD-10-CM | POA: Insufficient documentation

## 2013-10-25 DIAGNOSIS — I5041 Acute combined systolic (congestive) and diastolic (congestive) heart failure: Secondary | ICD-10-CM

## 2013-10-25 DIAGNOSIS — E119 Type 2 diabetes mellitus without complications: Secondary | ICD-10-CM

## 2013-10-25 LAB — CBC WITH DIFFERENTIAL/PLATELET
BASOS PCT: 0 % (ref 0–1)
Basophils Absolute: 0 10*3/uL (ref 0.0–0.1)
Eosinophils Absolute: 0.1 10*3/uL (ref 0.0–0.7)
Eosinophils Relative: 3 % (ref 0–5)
HCT: 27.5 % — ABNORMAL LOW (ref 36.0–46.0)
HEMOGLOBIN: 8.7 g/dL — AB (ref 12.0–15.0)
LYMPHS ABS: 1.5 10*3/uL (ref 0.7–4.0)
Lymphocytes Relative: 27 % (ref 12–46)
MCH: 26.9 pg (ref 26.0–34.0)
MCHC: 31.6 g/dL (ref 30.0–36.0)
MCV: 85.1 fL (ref 78.0–100.0)
Monocytes Absolute: 0.6 10*3/uL (ref 0.1–1.0)
Monocytes Relative: 10 % (ref 3–12)
NEUTROS ABS: 3.4 10*3/uL (ref 1.7–7.7)
NEUTROS PCT: 60 % (ref 43–77)
Platelets: 403 10*3/uL — ABNORMAL HIGH (ref 150–400)
RBC: 3.23 MIL/uL — AB (ref 3.87–5.11)
RDW: 16.6 % — ABNORMAL HIGH (ref 11.5–15.5)
WBC: 5.6 10*3/uL (ref 4.0–10.5)

## 2013-10-25 MED ORDER — HYDRALAZINE HCL 25 MG PO TABS: 37.5000 mg | ORAL_TABLET | Freq: Three times a day (TID) | ORAL | Status: DC

## 2013-10-25 MED ORDER — ATORVASTATIN CALCIUM 40 MG PO TABS: 40.0000 mg | ORAL_TABLET | Freq: Every day | ORAL | Status: DC

## 2013-10-25 MED ORDER — INSULIN ASPART 100 UNIT/ML ~~LOC~~ SOLN: 10.0000 [IU] | Freq: Three times a day (TID) | SUBCUTANEOUS | Status: DC

## 2013-10-25 MED ORDER — FUROSEMIDE 80 MG PO TABS: 80.0000 mg | ORAL_TABLET | Freq: Two times a day (BID) | ORAL | Status: DC

## 2013-10-25 MED ORDER — INSULIN GLARGINE 100 UNIT/ML SOLOSTAR PEN: 20.0000 [IU] | PEN_INJECTOR | Freq: Every day | SUBCUTANEOUS | Status: DC

## 2013-10-25 MED ORDER — CARVEDILOL 25 MG PO TABS: ORAL_TABLET | ORAL | Status: DC

## 2013-10-25 MED ORDER — INSULIN ASPART 100 UNIT/ML CARTRIDGE (PENFILL): 10.0000 [IU] | Freq: Three times a day (TID) | SUBCUTANEOUS | Status: DC

## 2013-10-25 MED ORDER — ISOSORBIDE DINITRATE 20 MG PO TABS
20.0000 mg | ORAL_TABLET | Freq: Three times a day (TID) | ORAL | Status: DC
Start: 2013-10-25 — End: 2013-11-05

## 2013-10-25 NOTE — Progress Notes (Signed)
Patient is follow up from hospital DX-pneumonia Has history of DM HTN

## 2013-10-25 NOTE — Progress Notes (Signed)
Patient Demographics  [name], is a 33 y.o. female  RUE:[id number]:[id number]  [id number]  DOB - Aug 09, 1981  CC:  Chief Complaint  Patient presents with  . Hospitalization Follow-up       HPI: Beth Douglasesha Butch is a 33 y.o. female here today to establish medical care. she has history of asthma, hypertension diabetes, she was recently hospitalized with symptoms of dyspnea cough, EMR reviewed she was found to have bronchitis/pneumonia and was treated with antibiotics, she was found to have renal insufficiency also had acute hypoxic respiratory failure secondary to asthmatic bronchitis and found to have acute systolic CHF, patient was treated with IV Lasix, patient also had cardiac cath done reported to have minimal CAD, echocardiogram reported EF of 35% to 40%, cardiology and nephrology was on board, patient has been advised for low-salt diet, she is requesting refill on her medications denies any chest pain or shortness of breath has some leg swelling, as per patient she is scheduled to follow with her cardiologist and nephrologist next month Patient has No headache, No chest pain, No abdominal pain - No Nausea, No new weakness tingling or numbness, No Cough - SOB.  Allergies  Allergen Reactions  . Aspirin Anaphylaxis  . Sulfur Hives  . Tramadol Other (See Comments)    Pt states she feels weird    Past Medical History  Diagnosis Date  . Hypertension   . Asthma   . Hyperlipidemia   . Pneumonia     "couple times; have it now" (09/15/2013)  . Chronic bronchitis     "just about q yr" (09/15/2013)  . Shortness of breath     "just recently; related to the pneumonia" (09/15/2013)  . Type II diabetes mellitus   . Anemia   . Migraine     "get them alot" (09/15/2013)  . Arthritis     "left hand" (09/15/2013)  . Chronic kidney disease     "low kidney function" (09/15/2013)   Current Outpatient Prescriptions on File Prior to Visit  Medication Sig Dispense Refill  . albuterol (PROVENTIL  HFA;VENTOLIN HFA) 108 (90 BASE) MCG/ACT inhaler Inhale into the lungs every 6 (six) hours as needed for wheezing or shortness of breath.      Marland Kitchen. albuterol (PROVENTIL) (2.5 MG/3ML) 0.083% nebulizer solution Take 2.5 mg by nebulization every 6 (six) hours as needed for wheezing or shortness of breath.      . budesonide (PULMICORT) 0.25 MG/2ML nebulizer solution Take 2 mLs (0.25 mg total) by nebulization 2 (two) times daily.  60 mL  12  . insulin glargine (LANTUS) 100 UNIT/ML injection Inject 0.2 mLs (20 Units total) into the skin at bedtime.  10 mL  12  . potassium chloride 20 MEQ/15ML (10%) solution Take 15 mLs (20 mEq total) by mouth daily.  500 mL  0  . predniSONE (DELTASONE) 10 MG tablet Take 3 tablet for one day the 2 tablet for one day then stop.  5 tablet  0   No current facility-administered medications on file prior to visit.   Family History  Problem Relation Age of Onset  . Diabetes Mother   . Stroke Mother   . Diabetes Maternal Grandmother   . Cancer Paternal Grandmother   . Hypertension Father   . Hyperlipidemia Father    History   Social History  . Marital Status: Single    Spouse Name: N/A    Number of Children: 2  . Years of Education: N/A   Occupational History  . Student  Social History Main Topics  . Smoking status: Never Smoker   . Smokeless tobacco: Never Used  . Alcohol Use: Yes     Comment: 09/15/2013 "glass of wine maybe twice/month"  . Drug Use: No  . Sexual Activity: Not Currently    Partners: Male    Birth Control/ Protection: Other-see comments     Comment: S/P tubal ligation   Other Topics Concern  . Not on file   Social History Narrative   Single.  Moved here from [place].  Lives with 2 children.      Review of Systems: Constitutional: Negative for fever, chills, diaphoresis, activity change, appetite change and fatigue. HENT: Negative for ear pain, nosebleeds, congestion, facial swelling, rhinorrhea, neck pain, neck stiffness and ear  discharge.  Eyes: Negative for pain, discharge, redness, itching and visual disturbance. Respiratory: Negative for cough, choking, chest tightness, shortness of breath, wheezing and stridor.  Cardiovascular: Negative for chest pain, palpitations and leg swelling. Gastrointestinal: Negative for abdominal distention. Genitourinary: Negative for dysuria, urgency, frequency, hematuria, flank pain, decreased urine volume, difficulty urinating and dyspareunia.  Musculoskeletal: Negative for back pain, joint swelling, arthralgia and gait problem. Neurological: Negative for dizziness, tremors, seizures, syncope, facial asymmetry, speech difficulty, weakness, light-headedness, numbness and headaches.  Hematological: Negative for adenopathy. Does not bruise/bleed easily. Psychiatric/Behavioral: Negative for hallucinations, behavioral problems, confusion, dysphoric mood, decreased concentration and agitation.    Objective:   Filed Vitals:   10/25/13 1210  BP: 152/96  Pulse: 96  Temp: 98.5 F (36.9 C)  Resp: 17    Physical Exam: Constitutional: Obese female sitting comfortably not in acute distress. HENT: Normocephalic, atraumatic, External right and left ear normal. Oropharynx is clear and moist.  Eyes: Conjunctivae and EOM are normal. PERRLA, no scleral icterus. Neck: Normal ROM. Neck supple. No JVD. No tracheal deviation. No thyromegaly. CVS: RRR, S1/S2 +, no murmurs, no gallops, no carotid bruit.  Pulmonary: Effort and breath sounds normal, no stridor, rhonchi, wheezes, rales.  Abdominal: Soft. BS +, no distension, tenderness, rebound or guarding.  Musculoskeletal: 1+ pedal edema  Neuro: Alert. Normal reflexes, muscle tone coordination. No cranial nerve deficit. Skin: Skin is warm and dry. No rash noted. Not diaphoretic. No erythema. No pallor.  Lab Results  Component Value Date   WBC 12.3* 09/22/2013   HGB 9.1* 09/22/2013   HCT 28.6* 09/22/2013   MCV 83.9 09/22/2013   PLT 446*  09/22/2013   Lab Results  Component Value Date   CREATININE 2.76* 09/22/2013   BUN 57* 09/22/2013   NA 132* 09/22/2013   K 4.4 09/22/2013   CL 99 09/22/2013   CO2 23 09/22/2013    Lab Results  Component Value Date   HGBA1C 10.0* 09/15/2013   Lipid Panel  No results found for this basename: chol, trig, hdl, cholhdl, vldl, ldlcalc       Assessment and plan:   1. Hypertension  - isosorbide dinitrate (ISORDIL) 20 MG tablet; Take 1 tablet (20 mg total) by mouth 3 (three) times daily.  Dispense: 30 tablet; Refill: 0 - hydrALAZINE (APRESOLINE) 25 MG tablet; Take 1.5 tablets (37.5 mg total) by mouth every 8 (eight) hours.  Dispense: 120 tablet; Refill: 0 - carvedilol (COREG) 25 MG tablet; 1 tablet PO BID.  Dispense: 60 tablet; Refill: 0  2. Diabetes mellitus- uncontrolled Last hemoglobin A1c was 10%, will do refill on Lantus and NovoLog, she is doing sliding scale, advised to monitor her blood sugar at home.  3. Unspecified asthma(493.90) Controlled  continue with her  Lipitor when necessary.   4. Morbid obesity- BMI 48 Diet and exercise  5. Community acquired pneumonia Already treated with antibiotics.  6. Acute combined systolic and diastolic heart failure Patient follow with her cardiologist. - carvedilol (COREG) 25 MG tablet; 1 tablet PO BID.  Dispense: 60 tablet; Refill: 0 - atorvastatin (LIPITOR) 40 MG tablet; Take 1 tablet (40 mg total) by mouth daily.  Dispense: 30 tablet; Refill: 3 - furosemide (LASIX) 80 MG tablet; Take 1 tablet (80 mg total) by mouth 2 (two) times daily.  Dispense: 60 tablet; Refill: 0  7. Screening We'll do baseline blood work. - CBC with Differential - COMPLETE METABOLIC PANEL WITH GFR - Lipid panel - TSH - Vit D  25 hydroxy (rtn osteoporosis monitoring)  Return in about 6 weeks (around 12/06/2013).   Doris Cheadle, MD

## 2013-10-26 ENCOUNTER — Telehealth: Payer: Self-pay

## 2013-10-26 DIAGNOSIS — R899 Unspecified abnormal finding in specimens from other organs, systems and tissues: Secondary | ICD-10-CM

## 2013-10-26 LAB — COMPLETE METABOLIC PANEL WITH GFR
ALK PHOS: 56 U/L (ref 39–117)
AST: 11 U/L (ref 0–37)
Albumin: 2.9 g/dL — ABNORMAL LOW (ref 3.5–5.2)
BUN: 21 mg/dL (ref 6–23)
CO2: 23 mEq/L (ref 19–32)
Calcium: 8.8 mg/dL (ref 8.4–10.5)
Chloride: 104 mEq/L (ref 96–112)
Creat: 2.14 mg/dL — ABNORMAL HIGH (ref 0.50–1.10)
GFR, Est African American: 34 mL/min — ABNORMAL LOW
GFR, Est Non African American: 29 mL/min — ABNORMAL LOW
GLUCOSE: 124 mg/dL — AB (ref 70–99)
Potassium: 4.9 mEq/L (ref 3.5–5.3)
SODIUM: 135 meq/L (ref 135–145)
TOTAL PROTEIN: 5.6 g/dL — AB (ref 6.0–8.3)
Total Bilirubin: 0.3 mg/dL (ref 0.3–1.2)

## 2013-10-26 LAB — VITAMIN D 25 HYDROXY (VIT D DEFICIENCY, FRACTURES): VIT D 25 HYDROXY: 14 ng/mL — AB (ref 30–89)

## 2013-10-26 LAB — LIPID PANEL
CHOL/HDL RATIO: 4.4 ratio
Cholesterol: 274 mg/dL — ABNORMAL HIGH (ref 0–200)
HDL: 62 mg/dL (ref >39)
LDL CALC: 176 mg/dL — AB (ref 0–99)
Triglycerides: 180 mg/dL — ABNORMAL HIGH (ref <150)
VLDL: 36 mg/dL (ref 0–40)

## 2013-10-26 LAB — TSH: TSH: 4.723 u[IU]/mL — AB (ref 0.350–4.500)

## 2013-10-26 MED ORDER — VITAMIN D (ERGOCALCIFEROL) 1.25 MG (50000 UNIT) PO CAPS: 50000.0000 [IU] | ORAL_CAPSULE | ORAL | Status: DC

## 2013-10-26 NOTE — Telephone Encounter (Signed)
Patient is aware of her lab results Prescription sent to pharmacy on file 

## 2013-10-26 NOTE — Telephone Encounter (Signed)
Message copied by Lestine Mount on Wed Oct 26, 2013  2:08 PM ------      Message from: Doris Cheadle      Created: Wed Oct 26, 2013 10:15 AM       Blood work reviewed patient still has anemia, advise patient take iron supplements one pill 3 times a day      Cholesterol is elevated advised patient to take Lipitor regularly.       noticed low vitamin D, call patient advise to start ergocalciferol 50,000 units once a week for the duration of  12 weeks.      Borderline abnormal TSH will repeat on the next visit ------

## 2013-11-05 ENCOUNTER — Other Ambulatory Visit: Payer: Self-pay | Admitting: Internal Medicine

## 2013-11-28 ENCOUNTER — Other Ambulatory Visit: Payer: Self-pay | Admitting: Internal Medicine

## 2013-12-01 ENCOUNTER — Other Ambulatory Visit: Payer: Self-pay | Admitting: Internal Medicine

## 2013-12-12 ENCOUNTER — Ambulatory Visit: Payer: Medicaid Other | Admitting: Internal Medicine

## 2013-12-25 ENCOUNTER — Other Ambulatory Visit: Payer: Self-pay | Admitting: Internal Medicine

## 2014-01-05 ENCOUNTER — Other Ambulatory Visit: Payer: Self-pay | Admitting: Internal Medicine

## 2014-01-06 ENCOUNTER — Other Ambulatory Visit: Payer: Self-pay | Admitting: Emergency Medicine

## 2014-01-06 DIAGNOSIS — I5041 Acute combined systolic (congestive) and diastolic (congestive) heart failure: Secondary | ICD-10-CM

## 2014-01-06 MED ORDER — FUROSEMIDE 80 MG PO TABS: 80.0000 mg | ORAL_TABLET | Freq: Two times a day (BID) | ORAL | Status: DC

## 2014-02-11 ENCOUNTER — Other Ambulatory Visit: Payer: Self-pay | Admitting: Internal Medicine

## 2014-02-11 DIAGNOSIS — I5041 Acute combined systolic (congestive) and diastolic (congestive) heart failure: Secondary | ICD-10-CM

## 2014-02-13 ENCOUNTER — Other Ambulatory Visit: Payer: Self-pay | Admitting: Internal Medicine

## 2014-02-13 DIAGNOSIS — I10 Essential (primary) hypertension: Secondary | ICD-10-CM

## 2014-02-13 MED ORDER — FUROSEMIDE 80 MG PO TABS: 80.0000 mg | ORAL_TABLET | Freq: Two times a day (BID) | ORAL | Status: DC

## 2014-02-17 ENCOUNTER — Emergency Department (HOSPITAL_COMMUNITY)
Admission: EM | Admit: 2014-02-17 | Discharge: 2014-02-17 | Disposition: A | Discharge status: Home / Self Care | Payer: Medicaid Other | Source: Home / Self Care | Attending: Emergency Medicine | Admitting: Emergency Medicine

## 2014-02-17 ENCOUNTER — Encounter (HOSPITAL_COMMUNITY): Payer: Self-pay | Admitting: Emergency Medicine

## 2014-02-17 ENCOUNTER — Emergency Department (INDEPENDENT_AMBULATORY_CARE_PROVIDER_SITE_OTHER): Payer: Medicaid Other

## 2014-02-17 DIAGNOSIS — S40019A Contusion of unspecified shoulder, initial encounter: Secondary | ICD-10-CM

## 2014-02-17 DIAGNOSIS — Y9383 Activity, rough housing and horseplay: Secondary | ICD-10-CM

## 2014-02-17 DIAGNOSIS — S40011A Contusion of right shoulder, initial encounter: Secondary | ICD-10-CM

## 2014-02-17 DIAGNOSIS — W06XXXA Fall from bed, initial encounter: Secondary | ICD-10-CM

## 2014-02-17 MED ORDER — HYDROCODONE-ACETAMINOPHEN 5-325 MG PO TABS: ORAL_TABLET | ORAL | Status: DC

## 2014-02-17 MED ORDER — ACETAMINOPHEN 325 MG PO TABS
ORAL_TABLET | ORAL | Status: AC
Start: 2014-02-17 — End: 2014-02-17
  Filled 2014-02-17: qty 2

## 2014-02-17 MED ORDER — ACETAMINOPHEN 325 MG PO TABS
650.0000 mg | ORAL_TABLET | Freq: Once | ORAL | Status: AC
  Administered 2014-02-17: 650 mg via ORAL

## 2014-02-17 NOTE — Discharge Instructions (Signed)
Most shoulder pain is caused by soft tissue problems rather than arthritis.  Rotator cuff tendonitis or tendonosis, rotator cuff tears, impingement syndrome and cartilege (labrum tears) are a few of the common causes of shoulder pain.  Fortunately, most of these can be treated with conservative measures as outlined below. ° °Do not do the following: °· Doing any work with the arms above shoulder level (especially lifting) until the pain has subsided. °· Sleeping on the affected side.  Especially avoid sleeping with your arm under your head or your pillow.  This is a habit that is hard to break.  Some people have to pin the arm of their pajamas to the chest area to prevent this. ° °Do the following: °· Do the shoulder exercises below twice daily followed by ice for 10 minutes. °· If no better in 1 month, follow up here, with your primary care doctor, or with an orthopedist. °· Use of over the counter pain meds can be of help.  Tylenol (or acetaminophen) is the safest to use.  It often helps to take this regularly.  You can take up to 2 325 mg tablets 5 times daily, but it best to start out much lower that that, perhaps 2 325 mg tablets twice daily, then increase from there. People who are on the blood thinner warfarin have to be careful about taking high doses of Tylenol.  For people who are able to tolerate them, ibuprofen and Aleve can also help with the pain.  You should discuss these agents with your physician before taking them.  People with chronic kidney disease, hypertension, peptic ulcer disease, and reflux can suffer adverse side effects. They should not be taken with warfarin. The maximum dosage of ibuprofen is 800 mg 3 times daily with meals.  The maximum dosage of Aleve is 2 and 1/2 tablets twice daily with food, but again, start out low and gradually increase the dose until adequate pain relief is achieved. Ibuprofen and Aleve should always be taken with food. ° ° ° ° ° ° °

## 2014-02-17 NOTE — ED Notes (Signed)
Right shoulder pain, onset this am. Reports playing around this am, arm hyper extended.  Since then shoulder has been painful.  Shoulder hurts too much to lift right arm

## 2014-02-17 NOTE — ED Provider Notes (Signed)
Chief Complaint   Chief Complaint  Patient presents with  . Shoulder Pain    History of Present Illness   [name] is a 33 year old female who was wrestling with her boyfriend in bed this morning, she fell out of bed, landing on the floor on her right shoulder. She did not hit her head and there was no loss of consciousness. Ever since then she's had pain over the shoulder and pain with abduction. She denies any numbness or tingling in the arm or muscle weakness. She's not able to move the shoulder at all. She's had no neck pain, collar bone pain, or chest pain. She denies any headache or neurological symptoms.  Review of Systems   Other than as noted above, the patient denies any of the following symptoms: Systemic:  No fevers or chills. Musculoskeletal:  No joint pain, arthritis, swelling, back pain, or neck pain. No history of arthritis.  Neurological:  No muscular weakness or paresthesia.  PMFSH   Past medical history, family history, social history, meds, and allergies were reviewed.  She has diabetes, hypertension, and asthma. She's allergic to sulfa, aspirin, and tramadol. Current meds include propranolol, Lipitor, Pulmicort, carvedilol, furosemide, a Priscoline, NovoLog, Lantus, Isordil, and potassium chloride.  Physical Examination     Vital signs:  BP 161/105  Pulse 86  Temp(Src) 98.3 F (36.8 C) (Oral)  Resp 18  SpO2 100%  LMP 02/16/2014 Gen:  Alert and oriented times 3.  In no distress. Musculoskeletal: She has pain to palpation over the distal humerus both anteriorly and posteriorly. There is no swelling, bruising, or deformity. The shoulder has a markedly diminished range of motion with pain on abduction, flexion, and internal and external rotation. Neer test was positive.  Hawkins test was positive.  Empty cans test was positive. Otherwise, all joints had a full a ROM with no swelling, bruising or deformity.  No edema, pulses full. Extremities were warm and pink.   Capillary refill was brisk.  Skin:  Clear, warm and dry.  No rash. Neuro:  Alert and oriented times 3.  Muscle strength was normal.  Sensation was intact to light touch.   Radiology   Dg Shoulder Right  02/17/2014   CLINICAL DATA:  Right shoulder injury.  Pain.  EXAM: RIGHT SHOULDER - 2+ VIEW  COMPARISON:  None.  FINDINGS: No fracture. No dislocation. No degenerative changes. Underlying ribs are intact. Normal soft tissues.  IMPRESSION: Negative.   Electronically Signed   By: Amie Portland M.D.   On: 02/17/2014 21:27   I reviewed the images independently and personally and concur with the radiologist's findings.  [facility]   Placed in a sling.  Assessment   The encounter diagnosis was Contusion of right shoulder.  No evidence of fracture. She has suffered a contusion to her rotator cuff. Suggested sling immobilization for about 3 days, then gradual remobilization. If no improvement at the end of 2 weeks, suggested followup with orthopedics.  Plan     1.  Meds:  The following meds were prescribed:   Discharge Medication List as of 02/17/2014  9:48 PM    START taking these medications   Details  HYDROcodone-acetaminophen (NORCO/VICODIN) 5-325 MG per tablet 1 to 2 tabs every 4 to 6 hours as needed for pain., Print        2.  Patient Education/Counseling:  The patient was given appropriate handouts, self care instructions, and instructed in symptomatic relief.  She was given exercises  to start doing in about 3 days.  3.  Follow up:  The patient was told to follow up here if no better in 3 to 4 days, or sooner if becoming worse in any way, and given some red flag symptoms such as worsening pain or new neurological symptoms which would prompt immediate return.  Follow up with Dr. Annell GreeningMark Yates if no improvement in 2 weeks.     Reuben Likesavid C Alizae Bechtel, MD 02/17/14 2219

## 2014-03-23 ENCOUNTER — Other Ambulatory Visit: Payer: Self-pay | Admitting: Internal Medicine

## 2014-03-25 ENCOUNTER — Other Ambulatory Visit: Payer: Self-pay | Admitting: Internal Medicine

## 2014-03-25 DIAGNOSIS — I5041 Acute combined systolic (congestive) and diastolic (congestive) heart failure: Secondary | ICD-10-CM

## 2014-03-28 ENCOUNTER — Other Ambulatory Visit: Payer: Self-pay | Admitting: Internal Medicine

## 2014-03-28 DIAGNOSIS — I10 Essential (primary) hypertension: Secondary | ICD-10-CM

## 2014-03-28 DIAGNOSIS — I5041 Acute combined systolic (congestive) and diastolic (congestive) heart failure: Secondary | ICD-10-CM

## 2014-03-30 ENCOUNTER — Other Ambulatory Visit: Payer: Self-pay | Admitting: Internal Medicine

## 2014-04-03 MED ORDER — ISOSORBIDE DINITRATE 20 MG PO TABS: ORAL_TABLET | ORAL | Status: DC

## 2014-04-03 MED ORDER — CARVEDILOL 25 MG PO TABS: ORAL_TABLET | ORAL | Status: DC

## 2014-04-03 MED ORDER — HYDRALAZINE HCL 25 MG PO TABS
37.5000 mg | ORAL_TABLET | Freq: Three times a day (TID) | ORAL | Status: DC
Start: 2014-04-03 — End: 2016-06-20

## 2014-04-04 MED ORDER — FUROSEMIDE 80 MG PO TABS: 80.0000 mg | ORAL_TABLET | Freq: Two times a day (BID) | ORAL | Status: DC

## 2014-04-04 NOTE — Telephone Encounter (Signed)
Patient requesting refill on lasix 80mg  Can this be refilled?

## 2014-05-26 ENCOUNTER — Encounter: Payer: Self-pay | Admitting: *Deleted

## 2014-05-26 NOTE — Progress Notes (Unsigned)
Progress notes received form [facility]. Progress notes placed in Dr. Ricki Rodriguez box for review.

## 2014-06-01 ENCOUNTER — Other Ambulatory Visit (HOSPITAL_COMMUNITY): Payer: Self-pay | Admitting: *Deleted

## 2014-06-02 ENCOUNTER — Encounter (HOSPITAL_COMMUNITY)
Admission: RE | Admit: 2014-06-02 | Discharge: 2014-06-02 | Disposition: A | Discharge status: Home / Self Care | Payer: Medicaid Other | Source: Ambulatory Visit | Attending: Nephrology | Admitting: Nephrology

## 2014-06-02 DIAGNOSIS — D509 Iron deficiency anemia, unspecified: Secondary | ICD-10-CM | POA: Insufficient documentation

## 2014-06-02 LAB — POCT HEMOGLOBIN-HEMACUE: Hemoglobin: 8.8 g/dL — ABNORMAL LOW (ref 12.0–15.0)

## 2014-06-02 MED ORDER — SODIUM CHLORIDE 0.9 % IV SOLN
1020.0000 mg | Freq: Once | INTRAVENOUS | Status: AC
  Administered 2014-06-02: 1020 mg via INTRAVENOUS
  Filled 2014-06-02: qty 34

## 2014-06-02 MED ORDER — EPOETIN ALFA 20000 UNIT/ML IJ SOLN
INTRAMUSCULAR | Status: AC
  Administered 2014-06-02: 20000 [IU] via SUBCUTANEOUS
  Filled 2014-06-02: qty 1

## 2014-06-02 MED ORDER — EPOETIN ALFA 20000 UNIT/ML IJ SOLN
20000.0000 [IU] | INTRAMUSCULAR | Status: DC
  Administered 2014-06-02: 20000 [IU] via SUBCUTANEOUS

## 2014-06-02 NOTE — Discharge Instructions (Signed)

## 2014-06-14 ENCOUNTER — Telehealth: Payer: Self-pay | Admitting: General Practice

## 2014-06-14 NOTE — Telephone Encounter (Signed)
Pt. Called to schedule appt. In regards to her shoulder pain....pt. Was offered an appt. For next Wednesday with her PCP...pt. Declined and was advised to call back when she returned back into town

## 2014-06-15 ENCOUNTER — Other Ambulatory Visit (HOSPITAL_COMMUNITY): Payer: Self-pay | Admitting: *Deleted

## 2014-06-16 ENCOUNTER — Encounter (HOSPITAL_COMMUNITY)
Admission: RE | Admit: 2014-06-16 | Discharge: 2014-06-16 | Disposition: A | Discharge status: Home / Self Care | Payer: Medicaid Other | Source: Ambulatory Visit | Attending: Nephrology | Admitting: Nephrology

## 2014-06-16 DIAGNOSIS — D509 Iron deficiency anemia, unspecified: Secondary | ICD-10-CM | POA: Diagnosis not present

## 2014-06-16 LAB — POCT HEMOGLOBIN-HEMACUE: Hemoglobin: 9.9 g/dL — ABNORMAL LOW (ref 12.0–15.0)

## 2014-06-16 MED ORDER — EPOETIN ALFA 20000 UNIT/ML IJ SOLN
INTRAMUSCULAR | Status: AC
  Administered 2014-06-16: 20000 [IU] via SUBCUTANEOUS
  Filled 2014-06-16: qty 1

## 2014-06-16 MED ORDER — EPOETIN ALFA 20000 UNIT/ML IJ SOLN: 20000.0000 [IU] | INTRAMUSCULAR | Status: DC

## 2014-06-20 ENCOUNTER — Emergency Department (HOSPITAL_COMMUNITY)
Admission: EM | Admit: 2014-06-20 | Discharge: 2014-06-20 | Disposition: A | Discharge status: Home / Self Care | Payer: Medicaid Other | Source: Home / Self Care | Attending: Family Medicine | Admitting: Family Medicine

## 2014-06-20 ENCOUNTER — Encounter (HOSPITAL_COMMUNITY): Payer: Self-pay | Admitting: Emergency Medicine

## 2014-06-20 DIAGNOSIS — M719 Bursopathy, unspecified: Secondary | ICD-10-CM

## 2014-06-20 DIAGNOSIS — M7551 Bursitis of right shoulder: Secondary | ICD-10-CM

## 2014-06-20 DIAGNOSIS — M67919 Unspecified disorder of synovium and tendon, unspecified shoulder: Secondary | ICD-10-CM

## 2014-06-20 MED ORDER — METHYLPREDNISOLONE 4 MG PO KIT: PACK | ORAL | Status: DC

## 2014-06-20 NOTE — Discharge Instructions (Signed)
Ice pack and medicine as prescribed, see orthopedist for further care, medicine may increase your sugar so insulin may need to be adjusted up temporarily.

## 2014-06-20 NOTE — ED Provider Notes (Signed)
CSN: [id number]     Arrival date & time 06/20/14  1239 History   First MD Initiated Contact with Patient 06/20/14 1357     Chief Complaint  Patient presents with  . Shoulder Pain   (Consider location/radiation/quality/duration/timing/severity/associated sxs/prior Treatment) Patient is a 33 y.o. female presenting with shoulder pain. The history is provided by the patient.  Shoulder Pain This is a recurrent problem. The current episode started more than 1 week ago (sx since may, seen and treated but no relief). The problem has not changed since onset.Pertinent negatives include no chest pain and no abdominal pain.    Past Medical History  Diagnosis Date  . Hypertension   . Asthma   . Hyperlipidemia   . Pneumonia     "couple times; have it now" (09/15/2013)  . Chronic bronchitis     "just about q yr" (09/15/2013)  . Shortness of breath     "just recently; related to the pneumonia" (09/15/2013)  . Type II diabetes mellitus   . Anemia   . Migraine     "get them alot" (09/15/2013)  . Arthritis     "left hand" (09/15/2013)  . Chronic kidney disease     "low kidney function" (09/15/2013)   Past Surgical History  Procedure Laterality Date  . Cesarean section  1999; 2006  . 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 Tonsillectomy  1997  . Tubal ligation  2006   Family History  Problem Relation Age of Onset  . Diabetes Mother   . Stroke Mother   . Diabetes Maternal Grandmother   . Cancer Paternal Grandmother   . Hypertension Father   . Hyperlipidemia Father    History  Substance Use Topics  . Smoking status: Never Smoker   . Smokeless tobacco: Never Used  . Alcohol Use: Yes     Comment: 09/15/2013 "glass of wine maybe twice/month"   OB History   Grav Para Term Preterm Abortions TAB SAB Ect Mult Living                 Review of Systems  Constitutional: Negative.   Cardiovascular: Negative for chest pain.   Gastrointestinal: Negative for abdominal pain.  Musculoskeletal: Positive for myalgias. Negative for back pain, gait problem, joint swelling, neck pain and neck stiffness.    Allergies  Aspirin; Sulfur; and Tramadol  Home Medications   Prior to Admission medications   Medication Sig Start Date End Date Taking? Authorizing Provider  albuterol (PROVENTIL HFA;VENTOLIN HFA) 108 (90 BASE) MCG/ACT inhaler Inhale into the lungs every 6 (six) hours as needed for wheezing or shortness of breath.    Historical Provider, MD  albuterol (PROVENTIL) (2.5 MG/3ML) 0.083% nebulizer solution Take 2.5 mg by nebulization every 6 (six) hours as needed for wheezing or shortness of breath.    Historical Provider, MD  atorvastatin (LIPITOR) 40 MG tablet Take 1 tablet (40 mg total) by mouth daily. 10/25/13   Doris Cheadle, MD  budesonide (PULMICORT) 0.25 MG/2ML nebulizer solution Take 2 mLs (0.25 mg total) by nebulization 2 (two) times daily. 09/22/13   Belkys A Regalado, MD  carvedilol (COREG) 25 MG tablet 1 tablet PO BID. 04/03/14   Doris Cheadle, MD  furosemide (LASIX) 80 MG tablet Take 80 mg by mouth 2 (two) times daily. 1 1/2 tablet 2 x daily 04/04/14   Quentin Angst, MD  hydrALAZINE (APRESOLINE) 25 MG tablet Take 1.5 tablets (37.5 mg total) by  mouth every 8 (eight) hours. 04/03/14   Doris Cheadle, MD  HYDROcodone-acetaminophen (NORCO/VICODIN) 5-325 MG per tablet 1 to 2 tabs every 4 to 6 hours as needed for pain. 02/17/14   Reuben Likes, MD  insulin aspart (NOVOLOG PENFILL) cartridge Inject 10 Units into the skin 3 (three) times daily with meals. 10/25/13   Doris Cheadle, MD  insulin aspart (NOVOLOG) 100 UNIT/ML injection Inject 10 Units into the skin 3 (three) times daily with meals. 10/25/13   Doris Cheadle, MD  insulin glargine (LANTUS) 100 UNIT/ML injection Inject 0.2 mLs (20 Units total) into the skin at bedtime. 09/22/13   Belkys A Regalado, MD  Insulin Glargine (LANTUS) 100 UNIT/ML Solostar Pen Inject 20  Units into the skin daily at 10 pm. 10/25/13   Doris Cheadle, MD  isosorbide dinitrate (ISORDIL) 20 MG tablet TAKE 1 TABLET BY MOUTH THREE TIMES DAILY 04/03/14   Doris Cheadle, MD  methylPREDNISolone (MEDROL DOSEPAK) 4 MG tablet follow package directions, start on wed, take all of pills. 06/20/14   Linna Hoff, MD  potassium chloride 20 MEQ/15ML (10%) solution Take 15 mLs (20 mEq total) by mouth daily. 09/22/13   Belkys A Regalado, MD  predniSONE (DELTASONE) 10 MG tablet Take 3 tablet for one day the 2 tablet for one day then stop. 09/22/13   Belkys A Regalado, MD  Vitamin D, Ergocalciferol, (DRISDOL) 50000 UNITS CAPS capsule Take 1 capsule (50,000 Units total) by mouth every 7 (seven) days. 10/26/13   Doris Cheadle, MD   BP 162/77  Pulse 87  Temp(Src) 98.1 F (36.7 C) (Oral)  Resp 16  SpO2 97%  LMP 05/23/2014 Physical Exam  Nursing note and vitals reviewed. Constitutional: She is oriented to person, place, and time. She appears well-developed and well-nourished.  Musculoskeletal: She exhibits tenderness.       Right shoulder: She exhibits decreased range of motion, tenderness, bony tenderness and decreased strength. She exhibits no swelling, no effusion, no crepitus, no deformity and normal pulse.  Neurological: She is alert and oriented to person, place, and time.  Skin: Skin is warm and dry.    ED Course  Procedures (including critical care time) Labs Review Labs Reviewed - No data to display  Imaging Review No results found.   MDM   1. Bursitis of shoulder, right        Linna Hoff, MD 06/20/14 1434

## 2014-06-20 NOTE — ED Notes (Signed)
Pt  Reports    Pain     r  Shoulder for  About  5  Months  With  P[ain  On  rom        Pt  denys  Any  specefic  Recent  Injury       Pt  Is  Requesting a  referell  To  An  Orthopedist

## 2014-06-20 NOTE — ED Notes (Signed)
Pt  Called      Stating     That        She  Attempted     To   Make  An  Appointment  With  Dr  Charlann Boxer  And  They  Needed    A  referell      Dr  Boneta Lucks   Office  Notified   H  AND P    And  referell    Faxed  To  Office

## 2014-06-23 ENCOUNTER — Encounter (HOSPITAL_COMMUNITY): Payer: Medicaid Other

## 2014-06-29 ENCOUNTER — Other Ambulatory Visit (HOSPITAL_COMMUNITY): Payer: Self-pay

## 2014-06-30 ENCOUNTER — Encounter (HOSPITAL_COMMUNITY)
Admission: RE | Admit: 2014-06-30 | Discharge: 2014-06-30 | Disposition: A | Discharge status: Home / Self Care | Payer: Medicaid Other | Source: Ambulatory Visit | Attending: Nephrology | Admitting: Nephrology

## 2014-06-30 DIAGNOSIS — D509 Iron deficiency anemia, unspecified: Secondary | ICD-10-CM | POA: Insufficient documentation

## 2014-06-30 LAB — POCT HEMOGLOBIN-HEMACUE: Hemoglobin: 11 g/dL — ABNORMAL LOW (ref 12.0–15.0)

## 2014-06-30 MED ORDER — EPOETIN ALFA 20000 UNIT/ML IJ SOLN: 20000.0000 [IU] | INTRAMUSCULAR | Status: DC

## 2014-06-30 MED ORDER — EPOETIN ALFA 20000 UNIT/ML IJ SOLN
INTRAMUSCULAR | Status: AC
Start: 2014-06-30 — End: 2014-06-30
  Administered 2014-06-30: 20000 [IU] via SUBCUTANEOUS
  Filled 2014-06-30: qty 1

## 2014-07-14 ENCOUNTER — Encounter (HOSPITAL_COMMUNITY)
Admission: RE | Admit: 2014-07-14 | Discharge: 2014-07-14 | Disposition: A | Discharge status: Home / Self Care | Payer: Medicaid Other | Source: Ambulatory Visit | Attending: Nephrology | Admitting: Nephrology

## 2014-07-14 DIAGNOSIS — D509 Iron deficiency anemia, unspecified: Secondary | ICD-10-CM | POA: Diagnosis not present

## 2014-07-14 LAB — POCT HEMOGLOBIN-HEMACUE: Hemoglobin: 10.8 g/dL — ABNORMAL LOW (ref 12.0–15.0)

## 2014-07-14 LAB — RENAL FUNCTION PANEL
ANION GAP: 14 (ref 5–15)
Albumin: 2.4 g/dL — ABNORMAL LOW (ref 3.5–5.2)
BUN: 50 mg/dL — ABNORMAL HIGH (ref 6–23)
CHLORIDE: 99 meq/L (ref 96–112)
CO2: 19 mEq/L (ref 19–32)
CREATININE: 3.69 mg/dL — AB (ref 0.50–1.10)
Calcium: 8.3 mg/dL — ABNORMAL LOW (ref 8.4–10.5)
GFR, EST AFRICAN AMERICAN: 17 mL/min — AB (ref >90)
GFR, EST NON AFRICAN AMERICAN: 15 mL/min — AB (ref >90)
Glucose, Bld: 249 mg/dL — ABNORMAL HIGH (ref 70–99)
Phosphorus: 5.2 mg/dL — ABNORMAL HIGH (ref 2.3–4.6)
Potassium: 4.3 mEq/L (ref 3.7–5.3)
SODIUM: 132 meq/L — AB (ref 137–147)

## 2014-07-14 LAB — IRON AND TIBC
Iron: 69 ug/dL (ref 42–135)
Saturation Ratios: 36 % (ref 20–55)
TIBC: 193 ug/dL — AB (ref 250–470)
UIBC: 124 ug/dL — ABNORMAL LOW (ref 125–400)

## 2014-07-14 LAB — FERRITIN: Ferritin: 250 ng/mL (ref 10–291)

## 2014-07-14 MED ORDER — EPOETIN ALFA 20000 UNIT/ML IJ SOLN
INTRAMUSCULAR | Status: AC
  Administered 2014-07-14: 20000 [IU] via SUBCUTANEOUS
  Filled 2014-07-14: qty 1

## 2014-07-14 MED ORDER — EPOETIN ALFA 20000 UNIT/ML IJ SOLN: 20000.0000 [IU] | INTRAMUSCULAR | Status: DC

## 2014-07-28 ENCOUNTER — Encounter (HOSPITAL_COMMUNITY)
Admission: RE | Admit: 2014-07-28 | Discharge: 2014-07-28 | Disposition: A | Discharge status: Home / Self Care | Payer: Medicaid Other | Source: Ambulatory Visit | Attending: Nephrology | Admitting: Nephrology

## 2014-07-28 DIAGNOSIS — D509 Iron deficiency anemia, unspecified: Secondary | ICD-10-CM | POA: Diagnosis not present

## 2014-07-28 LAB — POCT HEMOGLOBIN-HEMACUE: Hemoglobin: 10.7 g/dL — ABNORMAL LOW (ref 12.0–15.0)

## 2014-07-28 MED ORDER — EPOETIN ALFA 20000 UNIT/ML IJ SOLN
INTRAMUSCULAR | Status: AC
  Filled 2014-07-28: qty 1

## 2014-07-28 MED ORDER — EPOETIN ALFA 20000 UNIT/ML IJ SOLN
20000.0000 [IU] | INTRAMUSCULAR | Status: DC
  Administered 2014-07-28: 20000 [IU] via SUBCUTANEOUS

## 2014-08-10 ENCOUNTER — Other Ambulatory Visit (HOSPITAL_COMMUNITY): Payer: Self-pay

## 2014-08-11 ENCOUNTER — Encounter (HOSPITAL_COMMUNITY): Payer: Medicaid Other

## 2014-08-23 ENCOUNTER — Ambulatory Visit: Payer: Medicaid Other | Admitting: Internal Medicine

## 2014-08-28 ENCOUNTER — Inpatient Hospital Stay (HOSPITAL_COMMUNITY): Admission: RE | Admit: 2014-08-28 | Payer: Medicaid Other | Source: Ambulatory Visit

## 2014-08-30 ENCOUNTER — Inpatient Hospital Stay (HOSPITAL_COMMUNITY): Admission: RE | Admit: 2014-08-30 | Payer: Medicaid Other | Source: Ambulatory Visit

## 2014-09-07 ENCOUNTER — Encounter (HOSPITAL_COMMUNITY): Payer: Self-pay | Admitting: Cardiovascular Disease

## 2014-09-21 ENCOUNTER — Observation Stay (HOSPITAL_COMMUNITY)
Admission: EM | Admit: 2014-09-21 | Discharge: 2014-09-22 | Disposition: A | Discharge status: Home / Self Care | Payer: Medicaid Other | Attending: Internal Medicine | Admitting: Internal Medicine

## 2014-09-21 ENCOUNTER — Encounter (HOSPITAL_COMMUNITY): Payer: Self-pay

## 2014-09-21 DIAGNOSIS — Z794 Long term (current) use of insulin: Secondary | ICD-10-CM | POA: Diagnosis not present

## 2014-09-21 DIAGNOSIS — B9689 Other specified bacterial agents as the cause of diseases classified elsewhere: Secondary | ICD-10-CM

## 2014-09-21 DIAGNOSIS — Z884 Allergy status to anesthetic agent status: Secondary | ICD-10-CM | POA: Diagnosis not present

## 2014-09-21 DIAGNOSIS — L03115 Cellulitis of right lower limb: Secondary | ICD-10-CM | POA: Diagnosis not present

## 2014-09-21 DIAGNOSIS — I10 Essential (primary) hypertension: Secondary | ICD-10-CM | POA: Diagnosis present

## 2014-09-21 DIAGNOSIS — Z888 Allergy status to other drugs, medicaments and biological substances status: Secondary | ICD-10-CM | POA: Insufficient documentation

## 2014-09-21 DIAGNOSIS — I129 Hypertensive chronic kidney disease with stage 1 through stage 4 chronic kidney disease, or unspecified chronic kidney disease: Secondary | ICD-10-CM | POA: Insufficient documentation

## 2014-09-21 DIAGNOSIS — I5042 Chronic combined systolic (congestive) and diastolic (congestive) heart failure: Secondary | ICD-10-CM | POA: Insufficient documentation

## 2014-09-21 DIAGNOSIS — E785 Hyperlipidemia, unspecified: Secondary | ICD-10-CM | POA: Insufficient documentation

## 2014-09-21 DIAGNOSIS — J45909 Unspecified asthma, uncomplicated: Secondary | ICD-10-CM | POA: Diagnosis not present

## 2014-09-21 DIAGNOSIS — G43909 Migraine, unspecified, not intractable, without status migrainosus: Secondary | ICD-10-CM | POA: Insufficient documentation

## 2014-09-21 DIAGNOSIS — M7989 Other specified soft tissue disorders: Secondary | ICD-10-CM

## 2014-09-21 DIAGNOSIS — E669 Obesity, unspecified: Secondary | ICD-10-CM | POA: Insufficient documentation

## 2014-09-21 DIAGNOSIS — E1165 Type 2 diabetes mellitus with hyperglycemia: Secondary | ICD-10-CM

## 2014-09-21 DIAGNOSIS — I428 Other cardiomyopathies: Secondary | ICD-10-CM | POA: Diagnosis not present

## 2014-09-21 DIAGNOSIS — M1990 Unspecified osteoarthritis, unspecified site: Secondary | ICD-10-CM | POA: Insufficient documentation

## 2014-09-21 DIAGNOSIS — E1129 Type 2 diabetes mellitus with other diabetic kidney complication: Secondary | ICD-10-CM | POA: Diagnosis not present

## 2014-09-21 DIAGNOSIS — E1122 Type 2 diabetes mellitus with diabetic chronic kidney disease: Secondary | ICD-10-CM

## 2014-09-21 DIAGNOSIS — D649 Anemia, unspecified: Secondary | ICD-10-CM | POA: Diagnosis not present

## 2014-09-21 DIAGNOSIS — Z882 Allergy status to sulfonamides status: Secondary | ICD-10-CM | POA: Insufficient documentation

## 2014-09-21 DIAGNOSIS — L02415 Cutaneous abscess of right lower limb: Secondary | ICD-10-CM

## 2014-09-21 DIAGNOSIS — N185 Chronic kidney disease, stage 5: Secondary | ICD-10-CM | POA: Diagnosis present

## 2014-09-21 DIAGNOSIS — N184 Chronic kidney disease, stage 4 (severe): Secondary | ICD-10-CM | POA: Diagnosis not present

## 2014-09-21 DIAGNOSIS — I130 Hypertensive heart and chronic kidney disease with heart failure and stage 1 through stage 4 chronic kidney disease, or unspecified chronic kidney disease: Secondary | ICD-10-CM

## 2014-09-21 DIAGNOSIS — IMO0002 Reserved for concepts with insufficient information to code with codable children: Secondary | ICD-10-CM

## 2014-09-21 LAB — CBC WITH DIFFERENTIAL/PLATELET
Basophils Absolute: 0.1 10*3/uL (ref 0.0–0.1)
Basophils Relative: 1 % (ref 0–1)
EOS PCT: 2 % (ref 0–5)
Eosinophils Absolute: 0.2 10*3/uL (ref 0.0–0.7)
HEMATOCRIT: 29.5 % — AB (ref 36.0–46.0)
HEMOGLOBIN: 9.5 g/dL — AB (ref 12.0–15.0)
LYMPHS ABS: 1.5 10*3/uL (ref 0.7–4.0)
Lymphocytes Relative: 16 % (ref 12–46)
MCH: 27.5 pg (ref 26.0–34.0)
MCHC: 32.2 g/dL (ref 30.0–36.0)
MCV: 85.3 fL (ref 78.0–100.0)
MONOS PCT: 8 % (ref 3–12)
Monocytes Absolute: 0.8 10*3/uL (ref 0.1–1.0)
Neutro Abs: 7.2 10*3/uL (ref 1.7–7.7)
Neutrophils Relative %: 73 % (ref 43–77)
Platelets: 257 10*3/uL (ref 150–400)
RBC: 3.46 MIL/uL — AB (ref 3.87–5.11)
RDW: 13.8 % (ref 11.5–15.5)
WBC: 9.7 10*3/uL (ref 4.0–10.5)

## 2014-09-21 LAB — GLUCOSE, CAPILLARY: Glucose-Capillary: 232 mg/dL — ABNORMAL HIGH (ref 70–99)

## 2014-09-21 LAB — URINALYSIS, ROUTINE W REFLEX MICROSCOPIC
BILIRUBIN URINE: NEGATIVE
Glucose, UA: 250 mg/dL — AB
Ketones, ur: NEGATIVE mg/dL
Leukocytes, UA: NEGATIVE
Nitrite: NEGATIVE
SPECIFIC GRAVITY, URINE: 1.012 (ref 1.005–1.030)
UROBILINOGEN UA: 0.2 mg/dL (ref 0.0–1.0)
pH: 6.5 (ref 5.0–8.0)

## 2014-09-21 LAB — URINE MICROSCOPIC-ADD ON

## 2014-09-21 LAB — COMPREHENSIVE METABOLIC PANEL
ALK PHOS: 73 U/L (ref 39–117)
ALT: 19 U/L (ref 0–35)
ANION GAP: 9 (ref 5–15)
AST: 19 U/L (ref 0–37)
Albumin: 2 g/dL — ABNORMAL LOW (ref 3.5–5.2)
BUN: 56 mg/dL — AB (ref 6–23)
CO2: 22 mmol/L (ref 19–32)
Calcium: 7.6 mg/dL — ABNORMAL LOW (ref 8.4–10.5)
Chloride: 104 mEq/L (ref 96–112)
Creatinine, Ser: 3.88 mg/dL — ABNORMAL HIGH (ref 0.50–1.10)
GFR calc non Af Amer: 14 mL/min — ABNORMAL LOW (ref >90)
GFR, EST AFRICAN AMERICAN: 16 mL/min — AB (ref >90)
GLUCOSE: 244 mg/dL — AB (ref 70–99)
POTASSIUM: 4.7 mmol/L (ref 3.5–5.1)
Sodium: 135 mmol/L (ref 135–145)
Total Bilirubin: 0.6 mg/dL (ref 0.3–1.2)
Total Protein: 5.4 g/dL — ABNORMAL LOW (ref 6.0–8.3)

## 2014-09-21 LAB — CBG MONITORING, ED: Glucose-Capillary: 259 mg/dL — ABNORMAL HIGH (ref 70–99)

## 2014-09-21 MED ORDER — HYDROCODONE-ACETAMINOPHEN 5-325 MG PO TABS
1.0000 | ORAL_TABLET | ORAL | Status: DC | PRN
Start: 2014-09-21 — End: 2014-09-22
  Administered 2014-09-22: 1 via ORAL
  Filled 2014-09-21: qty 1

## 2014-09-21 MED ORDER — FUROSEMIDE 80 MG PO TABS
160.0000 mg | ORAL_TABLET | Freq: Two times a day (BID) | ORAL | Status: DC
  Administered 2014-09-22: 160 mg via ORAL
  Filled 2014-09-21 (×3): qty 2

## 2014-09-21 MED ORDER — INSULIN GLARGINE 100 UNIT/ML ~~LOC~~ SOLN
20.0000 [IU] | Freq: Every day | SUBCUTANEOUS | Status: DC
  Administered 2014-09-22: 20 [IU] via SUBCUTANEOUS
  Filled 2014-09-21 (×2): qty 0.2

## 2014-09-21 MED ORDER — INSULIN ASPART 100 UNIT/ML ~~LOC~~ SOLN
6.0000 [IU] | Freq: Three times a day (TID) | SUBCUTANEOUS | Status: DC
  Administered 2014-09-22: 6 [IU] via SUBCUTANEOUS

## 2014-09-21 MED ORDER — HEPARIN SODIUM (PORCINE) 5000 UNIT/ML IJ SOLN
5000.0000 [IU] | Freq: Three times a day (TID) | INTRAMUSCULAR | Status: DC
  Filled 2014-09-21 (×3): qty 1

## 2014-09-21 MED ORDER — CLINDAMYCIN PHOSPHATE 600 MG/50ML IV SOLN
600.0000 mg | Freq: Once | INTRAVENOUS | Status: AC
  Administered 2014-09-21: 600 mg via INTRAVENOUS
  Filled 2014-09-21: qty 50

## 2014-09-21 MED ORDER — ISOSORBIDE DINITRATE 20 MG PO TABS
20.0000 mg | ORAL_TABLET | Freq: Three times a day (TID) | ORAL | Status: DC
  Administered 2014-09-22 (×2): 20 mg via ORAL
  Filled 2014-09-21 (×4): qty 1

## 2014-09-21 MED ORDER — HYDRALAZINE HCL 25 MG PO TABS
37.5000 mg | ORAL_TABLET | Freq: Three times a day (TID) | ORAL | Status: DC
  Administered 2014-09-22: 37.5 mg via ORAL
  Filled 2014-09-21 (×5): qty 1.5

## 2014-09-21 MED ORDER — CARVEDILOL 25 MG PO TABS
25.0000 mg | ORAL_TABLET | Freq: Two times a day (BID) | ORAL | Status: DC
  Administered 2014-09-22: 25 mg via ORAL
  Filled 2014-09-21 (×3): qty 1

## 2014-09-21 MED ORDER — INSULIN ASPART 100 UNIT/ML ~~LOC~~ SOLN
0.0000 [IU] | Freq: Three times a day (TID) | SUBCUTANEOUS | Status: DC
  Administered 2014-09-22: 3 [IU] via SUBCUTANEOUS

## 2014-09-21 MED ORDER — HYDROCODONE-ACETAMINOPHEN 5-325 MG PO TABS
1.0000 | ORAL_TABLET | ORAL | Status: DC | PRN
  Administered 2014-09-21: 1 via ORAL
  Filled 2014-09-21: qty 1

## 2014-09-21 MED ORDER — INSULIN ASPART 100 UNIT/ML ~~LOC~~ SOLN
0.0000 [IU] | Freq: Every day | SUBCUTANEOUS | Status: DC
  Administered 2014-09-22: 2 [IU] via SUBCUTANEOUS

## 2014-09-21 MED ORDER — LIDOCAINE HCL (PF) 2 % IJ SOLN
0.0000 mL | Freq: Once | INTRAMUSCULAR | Status: AC | PRN
Start: 2014-09-21 — End: 2014-09-21
  Administered 2014-09-21: 10 mL via INTRADERMAL
  Filled 2014-09-21: qty 20

## 2014-09-21 MED ORDER — BUDESONIDE 0.25 MG/2ML IN SUSP
0.2500 mg | Freq: Two times a day (BID) | RESPIRATORY_TRACT | Status: DC
  Administered 2014-09-22: 0.25 mg via RESPIRATORY_TRACT
  Filled 2014-09-21 (×4): qty 2

## 2014-09-21 MED ORDER — DOXYCYCLINE HYCLATE 100 MG PO TABS
100.0000 mg | ORAL_TABLET | Freq: Two times a day (BID) | ORAL | Status: DC
  Administered 2014-09-22 (×2): 100 mg via ORAL
  Filled 2014-09-21 (×3): qty 1

## 2014-09-21 MED ORDER — ATORVASTATIN CALCIUM 40 MG PO TABS
40.0000 mg | ORAL_TABLET | Freq: Every day | ORAL | Status: DC
  Administered 2014-09-22: 40 mg via ORAL
  Filled 2014-09-21: qty 1

## 2014-09-21 MED ORDER — ALBUTEROL SULFATE (2.5 MG/3ML) 0.083% IN NEBU: 2.5000 mg | INHALATION_SOLUTION | Freq: Four times a day (QID) | RESPIRATORY_TRACT | Status: DC | PRN

## 2014-09-21 NOTE — ED Notes (Signed)
Attempted report 

## 2014-09-21 NOTE — Progress Notes (Signed)
*  Preliminary Results* Right lower extremity venous duplex completed. Study was very technically limited due to edema and patient body habitus. Unable to visualize most of the right lower extremity veins with the exception of the right popliteal vein which was patent. Due to technical limitations, unable to determine if there is deep vein thrombosis in the veins that were not visualized.  09/21/2014 4:51 PM  Gertie Fey, RVT, RDCS, RDMS

## 2014-09-21 NOTE — H&P (Signed)
Date: 09/21/2014               Patient Name:  [name] MRN: [id number]  DOB: 09-24-1981 Age / Sex: 33 y.o., female   PCP: No Pcp Per Patient         Medical Service: Internal Medicine Teaching Service         Attending Physician: Dr. Earl Lagos, MD    First Contact: Dr. Danella Penton Pager: [phone]  Second Contact: Dr. 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  Right foot started hurting/swelling 2 days ago, and turned red 1 day ago. Denies any injury, bug bites, rashes, open wounds anywhere else. Denies any fever/chills. Denies any calf pain, hx of blood clots. Has some dry cough and nasal congestion. No SOB, CP, n/v. Was doing well prior to this incident. Has chronic lower extremity swelling at baseline with no changes recently in the baseline swelling or her weight. Is on lasix 160 BID for chronic LE edema. EF 35-40% on ECHO 08/2013. Follows up with Dr. Lacy Duverney nephrology for most of her primary care. Has been compliant with all of her meds. Denies any dysuria.  Doesn't smoke, drink, or use any drugs. Doing well otherwise except the foot pain.  Doppler study was done in the ED and it was limited due to edema and body habitus.    Meds: Current Facility-Administered Medications  Medication Dose Route Frequency Provider Last Rate Last Dose  . HYDROcodone-acetaminophen (NORCO/VICODIN) 5-325 MG per tablet 1 tablet  1 tablet Oral Q4H PRN Gust Rung, DO       Current Outpatient Prescriptions  Medication Sig Dispense Refill  . albuterol (PROVENTIL HFA;VENTOLIN HFA) 108 (90 BASE) MCG/ACT inhaler Inhale into the lungs every 6 (six) hours as needed for wheezing or shortness of breath.      Marland Kitchen albuterol (PROVENTIL) (2.5 MG/3ML) 0.083% nebulizer solution Take 2.5 mg by nebulization every 6 (six) hours as needed for wheezing or shortness of breath.    Marland Kitchen atorvastatin (LIPITOR) 40 MG tablet Take 1 tablet (40 mg total) by mouth daily. 30 tablet 3  . budesonide (PULMICORT) 0.25 MG/2ML nebulizer solution Take 2 mLs (0.25 mg total) by nebulization 2 (two) times daily. 60 mL 12  . carvedilol (COREG) 25 MG tablet 1 tablet PO BID. (Patient taking differently: Take 25 mg by mouth 2 (two) times daily with a meal. 1 tablet PO BID.) 60 tablet 0  . furosemide (LASIX) 80 MG tablet Take 160 mg by mouth 2 (two) times daily. 1 1/2 tablet 2 x daily    . hydrALAZINE (APRESOLINE) 25 MG tablet Take 1.5 tablets (37.5 mg total) by mouth every 8 (eight) hours. 120 tablet 0  . HYDROcodone-acetaminophen (NORCO/VICODIN) 5-325 MG per tablet 1 to 2 tabs every 4 to 6 hours as needed for pain. 20 tablet 0  . insulin aspart (NOVOLOG PENFILL) cartridge Inject 10 Units into the skin 3 (three) times daily with meals. (Patient taking differently: Inject 15 Units into the skin 3 (three) times daily with meals. ) 15 mL 11  . insulin glargine (LANTUS) 100 UNIT/ML injection Inject 0.2 mLs (20 Units total) into the skin at bedtime.  10 mL 12  . isosorbide dinitrate (ISORDIL) 20 MG tablet TAKE 1 TABLET BY MOUTH THREE TIMES DAILY 270 tablet 0    Allergies: Allergies as of 09/21/2014 - Review Complete 09/21/2014  Allergen Reaction Noted  . Aspirin Anaphylaxis 05/25/2013  . Sulfur Hives 05/25/2013  . Tramadol Other (See Comments) 05/25/2013   Past Medical History  Diagnosis Date  . Hypertension   . Asthma   . Hyperlipidemia   . Pneumonia     "couple times; have it now" (09/15/2013)  . Chronic bronchitis     "just about q yr" (09/15/2013)  . Shortness of breath     "just recently; related to the pneumonia" (09/15/2013)  . Type II diabetes mellitus   . Anemia   . Migraine     "get them alot" (09/15/2013)  . Arthritis      "left hand" (09/15/2013)  . Chronic kidney disease     "low kidney function" (09/15/2013)   Past Surgical History  Procedure Laterality Date  . Cesarean section  1999; 2006  . 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 Tonsillectomy  1997  . Tubal ligation  2006  . Coronary angiogram  09/20/2013    Procedure: CORONARY ANGIOGRAM;  Surgeon: Runell GessJonathan J Berry, MD;  [facility];  Service: Cardiovascular;;   Family History  Problem Relation Age of Onset  . Diabetes Mother   . Stroke Mother   . Diabetes Maternal Grandmother   . Cancer Paternal Grandmother   . Hypertension Father   . Hyperlipidemia Father    History   Social History  . Marital Status: Single    Spouse Name: N/A    Number of Children: 2  . Years of Education: N/A   Occupational History  . Student    Social History Main Topics  . Smoking status: Never Smoker   . Smokeless tobacco: Never Used  . Alcohol Use: No  . Drug Use: No  . Sexual Activity:    Partners: Male    Birth Control/ Protection: Other-see comments     Comment: S/P tubal ligation   Other Topics Concern  . Not on file   Social History Narrative   Single.  [place]oved [place] from [place]  Lives with 2 children.      Review of Systems: Review of Systems  Constitutional: Negative for fever, chills, weight loss, malaise/fatigue and diaphoresis.  HENT: Positive for congestion. Negative for ear discharge, ear pain, hearing loss, nosebleeds, sore throat and tinnitus.   Eyes: Negative for blurred vision, double vision, photophobia, pain, discharge and redness.  Respiratory: Positive for cough. Negative for hemoptysis, sputum production, shortness of breath, wheezing and stridor.   Cardiovascular: Positive for leg swelling. Negative for chest pain, palpitations, orthopnea, claudication and PND.  Gastrointestinal: Negative.   Genitourinary: Negative for dysuria.  Musculoskeletal: Negative for  myalgias, back pain, joint pain, falls and neck pain.       Right dorsal foot pain and swelling.  Skin: Negative.   Neurological: Negative.  Negative for weakness and headaches.  Endo/Heme/Allergies: Negative.   Psychiatric/Behavioral: Negative.      Physical Exam: Blood pressure 176/94, pulse 107, temperature 98.7 F (37.1 C), temperature source Oral, resp. rate 18, height 5\' 9"  (1.753 m), weight 310 lb (140.615 kg), last menstrual period 08/28/2014, SpO2 98 %. Physical Exam  Constitutional: She is oriented to person, place, and time. She appears well-developed and well-nourished.  Obese female. Initially not  in distress but in appropriate pain during the I&D of her abscess.   HENT:  Head: Normocephalic and atraumatic.  Right Ear: External ear normal.  Left Ear: External ear normal.  Nose: Nose normal.  Mouth/Throat: Oropharynx is clear and moist. No oropharyngeal exudate.  Eyes: Conjunctivae and EOM are normal. Pupils are equal, round, and reactive to light. Right eye exhibits no discharge. Left eye exhibits no discharge. No scleral icterus.  Neck: Normal range of motion. Neck supple. No JVD present. No tracheal deviation present. No thyromegaly present.  Cardiovascular: Regular rhythm.  Tachycardia present.  Exam reveals no gallop and no friction rub.   No murmur heard. Respiratory: Effort normal and breath sounds normal. No stridor. No respiratory distress. She has no wheezes. She has no rales. She exhibits no tenderness.  GI: Soft. Bowel sounds are normal. She exhibits no distension and no mass. There is no tenderness. There is no rebound and no guarding.  Musculoskeletal: Normal range of motion. She exhibits edema and tenderness.  Has 2+ non pitting edema.  There is a ~1.5 cm rounded skin lesion with surround erythema and severe TTP to palpation on the right dorsal foot, likely abscess. See picture. Otherwise leg is normal. No open wounds visible.  Abscess was drained and  expressed yellow purulent material.  Lymphadenopathy:    She has no cervical adenopathy.  Neurological: She is alert and oriented to person, place, and time. She displays normal reflexes. No cranial nerve deficit. She exhibits normal muscle tone. Coordination normal.  Skin: Skin is warm. She is not diaphoretic.  Psychiatric: She has a normal mood and affect.       Lab results: Basic Metabolic Panel:  Recent Labs  14/78/29 1709  NA 135  K 4.7  CL 104  CO2 22  GLUCOSE 244*  BUN 56*  CREATININE 3.88*  CALCIUM 7.6*   Liver Function Tests:  Recent Labs  09/21/14 1709  AST 19  ALT 19  ALKPHOS 73  BILITOT 0.6  PROT 5.4*  ALBUMIN 2.0*   No results for input(s): LIPASE, AMYLASE in the last 72 hours. No results for input(s): AMMONIA in the last 72 hours. CBC:  Recent Labs  09/21/14 1709  WBC 9.7  NEUTROABS 7.2  HGB 9.5*  HCT 29.5*  MCV 85.3  PLT 257   Cardiac Enzymes: No results for input(s): CKTOTAL, CKMB, CKMBINDEX, TROPONINI in the last 72 hours. BNP: No results for input(s): PROBNP in the last 72 hours. D-Dimer: No results for input(s): DDIMER in the last 72 hours. CBG:  Recent Labs  09/21/14 1604  GLUCAP 259*   Hemoglobin A1C: No results for input(s): HGBA1C in the last 72 hours. Fasting Lipid Panel: No results for input(s): CHOL, HDL, LDLCALC, TRIG, CHOLHDL, LDLDIRECT in the last 72 hours. Thyroid Function Tests: No results for input(s): TSH, T4TOTAL, FREET4, T3FREE, THYROIDAB in the last 72 hours. Anemia Panel: No results for input(s): VITAMINB12, FOLATE, FERRITIN, TIBC, IRON, RETICCTPCT in the last 72 hours. Coagulation: No results for input(s): LABPROT, INR in the last 72 hours. Urine Drug Screen: Drugs of Abuse  No results found for: LABOPIA, COCAINSCRNUR, LABBENZ, AMPHETMU, THCU, LABBARB  Alcohol Level: No results for input(s): ETH in the last 72 hours. Urinalysis:  Recent Labs  09/21/14 1908  COLORURINE YELLOW  LABSPEC 1.012    PHURINE 6.5  GLUCOSEU 250*  HGBUR MODERATE*  BILIRUBINUR NEGATIVE  KETONESUR NEGATIVE  PROTEINUR >300*  UROBILINOGEN 0.2  NITRITE NEGATIVE  LEUKOCYTESUR NEGATIVE   Misc. Labs:  Imaging results:  No results found.  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  Cellulitis of right foot with abscess without systemic signs- has cellulitis of the dorsum of right foot with abscess that was I&D'ed draining purulent material. Already got dose of clinda in the ED. Just I&D might have been curative for her cellulitis but given she her other comorbidities including DM II and CHF and the purulence, we will treat her with abx. Has sulfa allergy. Will treat with doxycycline.  - sent wound culture of the purulent material from the I&D. F/up culture. - takes norco at home for right shoulder pain which we will continue here for her foot pain, expecting it to already improve significantly after removing the pressure with the I&D. - checked HIV. F/up. - will not repeat lab. She is very stable.  - cont doxycyline, likely needs for ~5-7 days.   Chronic combined CHF - last EF 35-40% 08/2013 - volume stable on exam. - continue home meds: coreg, hydralazine, imdur. - continue lasix 160mg  BID  DM II - uncontrolled. Last hgba1c 10 one year ago. Takes lantus 20 qhs and 15 units TID with meals at home. - will do 20 u lantus + 6 TID with meals + SSI here. Cam resume home dose on d/c.  HTN - in 160-170's. Here.  - will resume home regimen and monitor. - cont hydralazine + imdur TID, Coreg BID.    CKD stage IV - GFR around baseline. Crt 3.88, baseline ~ 3.69.  - f/up Dr. Oliver Hum outpatient.   Anemia, normocytic - likely 2/2 to CKD. hgb overall stable around 9.5, baseline around 10-11.  asymptomatic bacteruria - will not treat.   HLD - cont lipitor.   Dispo: Disposition is deferred at this time, awaiting improvement of current medical problems. Anticipated discharge in approximately 1 day(s).   The patient does not have a current PCP (No Pcp Per Patient) and does need an [facility] hospital follow-up appointment after discharge.  The patient does have transportation limitations that hinder transportation to clinic appointments.  Signed: Hyacinth Meeker, MD 09/21/2014, 9:51 PM

## 2014-09-21 NOTE — ED Provider Notes (Signed)
CSN: [id number]     Arrival date & time 09/21/14  1549 History   First MD Initiated Contact with Patient 09/21/14 1553     Chief Complaint  Patient presents with  . Foot Swelling  . Foot Pain     (Consider location/radiation/quality/duration/timing/severity/associated sxs/prior Treatment) Patient is a 33 y.o. female presenting with lower extremity pain.  Foot Pain This is a new problem. The current episode started 2 days ago. The problem occurs constantly. The problem has been gradually worsening. Pertinent negatives include no chest pain, no abdominal pain, no headaches and no shortness of breath. The symptoms are aggravated by walking, twisting and standing. Nothing relieves the symptoms. She has tried nothing for the symptoms.    Past Medical History  Diagnosis Date  . Hypertension   . Asthma   . Hyperlipidemia   . Pneumonia     "couple times; have it now" (09/15/2013)  . Chronic bronchitis     "just about q yr" (09/15/2013)  . Shortness of breath     "just recently; related to the pneumonia" (09/15/2013)  . Type II diabetes mellitus   . Anemia   . Migraine     "get them alot" (09/15/2013)  . Arthritis     "left hand" (09/15/2013)  . Chronic kidney disease     "low kidney function" (09/15/2013)   Past Surgical History  Procedure Laterality Date  . Cesarean section  1999; 2006  . 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 Tonsillectomy  1997  . Tubal ligation  2006  . Coronary angiogram  09/20/2013    Procedure: CORONARY ANGIOGRAM;  Surgeon: Runell GessJonathan J Berry, MD;  [facility];  Service: Cardiovascular;;   Family History  Problem Relation Age of Onset  . Diabetes Mother   . Stroke Mother   . Diabetes Maternal Grandmother   . Cancer Paternal Grandmother   . Hypertension Father   . Hyperlipidemia Father    History  Substance Use Topics  . Smoking status: Never Smoker   . Smokeless tobacco: Never Used   . Alcohol Use: No   OB History    No data available     Review of Systems  Respiratory: Negative for shortness of breath.   Cardiovascular: Negative for chest pain.  Gastrointestinal: Negative for abdominal pain.  Neurological: Negative for headaches.  All other systems reviewed and are negative.     Allergies  Aspirin; Sulfur; and Tramadol  Home Medications   Prior to Admission medications   Medication Sig Start Date End Date Taking? Authorizing Provider  albuterol (PROVENTIL HFA;VENTOLIN HFA) 108 (90 BASE) MCG/ACT inhaler Inhale into the lungs every 6 (six) hours as needed for wheezing or shortness of breath.   Yes Historical Provider, MD  albuterol (PROVENTIL) (2.5 MG/3ML) 0.083% nebulizer solution Take 2.5 mg by nebulization every 6 (six) hours as needed for wheezing or shortness of breath.   Yes Historical Provider, MD  atorvastatin (LIPITOR) 40 MG tablet Take 1 tablet (40 mg total) by mouth daily. 10/25/13  Yes Deepak Advani, MD  budesonide (PULMICORT) 0.25 MG/2ML nebulizer solution Take 2 mLs (0.25 mg total) by nebulization 2 (two) times daily. 09/22/13  Yes Belkys A Regalado, MD  carvedilol (COREG) 25 MG tablet 1 tablet PO BID. Patient taking differently: Take 25 mg by mouth 2 (two) times daily with a meal. 1 tablet PO BID. 04/03/14  Yes Deepak Advani, MD  furosemide (LASIX) 80 MG tablet  Take 160 mg by mouth 2 (two) times daily. 1 1/2 tablet 2 x daily 04/04/14  Yes Olugbemiga E Hyman Hopes, MD  hydrALAZINE (APRESOLINE) 25 MG tablet Take 1.5 tablets (37.5 mg total) by mouth every 8 (eight) hours. 04/03/14  Yes Doris Cheadle, MD  HYDROcodone-acetaminophen (NORCO/VICODIN) 5-325 MG per tablet 1 to 2 tabs every 4 to 6 hours as needed for pain. 02/17/14  Yes Reuben Likes, MD  insulin aspart (NOVOLOG PENFILL) cartridge Inject 10 Units into the skin 3 (three) times daily with meals. Patient taking differently: Inject 15 Units into the skin 3 (three) times daily with meals.  10/25/13  Yes  Deepak Advani, MD  insulin glargine (LANTUS) 100 UNIT/ML injection Inject 0.2 mLs (20 Units total) into the skin at bedtime. 09/22/13  Yes Belkys A Regalado, MD  isosorbide dinitrate (ISORDIL) 20 MG tablet TAKE 1 TABLET BY MOUTH THREE TIMES DAILY 04/03/14  Yes Doris Cheadle, MD  doxycycline (VIBRA-TABS) 100 MG tablet Take 1 tablet (100 mg total) by mouth 2 (two) times daily. 09/22/14   Gara Kroner, MD   BP 145/85 mmHg  Pulse 99  Temp(Src) 98.5 F (36.9 C) (Oral)  Resp 16  Ht 5\' 9"  (1.753 m)  Wt 312 lb 8 oz (141.75 kg)  BMI 46.13 kg/m2  SpO2 97%  LMP 08/28/2014 Physical Exam  Constitutional: She is oriented to person, place, and time. She appears well-developed and well-nourished.  HENT:  Head: Normocephalic and atraumatic.  Right Ear: External ear normal.  Left Ear: External ear normal.  Eyes: Conjunctivae and EOM are normal. Pupils are equal, round, and reactive to light.  Neck: Normal range of motion. Neck supple.  Cardiovascular: Normal rate, regular rhythm, normal heart sounds and intact distal pulses.   Pulmonary/Chest: Effort normal and breath sounds normal.  Abdominal: Soft. Bowel sounds are normal. There is no tenderness.  Musculoskeletal: Normal range of motion.  Neurological: She is alert and oriented to person, place, and time.  Skin: Skin is warm and dry. Rash (erythema over R dorsum of foot extending laterally around ankle) noted.  Vitals reviewed.   ED Course  Procedures (including critical care time) Labs Review Labs Reviewed  CBC WITH DIFFERENTIAL - Abnormal; Notable for the following:    RBC 3.46 (*)    Hemoglobin 9.5 (*)    HCT 29.5 (*)    All other components within normal limits  COMPREHENSIVE METABOLIC PANEL - Abnormal; Notable for the following:    Glucose, Bld 244 (*)    BUN 56 (*)    Creatinine, Ser 3.88 (*)    Calcium 7.6 (*)    Total Protein 5.4 (*)    Albumin 2.0 (*)    GFR calc non Af Amer 14 (*)    GFR calc Af Amer 16 (*)    All other  components within normal limits  URINALYSIS, ROUTINE W REFLEX MICROSCOPIC - Abnormal; Notable for the following:    APPearance CLOUDY (*)    Glucose, UA 250 (*)    Hgb urine dipstick MODERATE (*)    Protein, ur >300 (*)    All other components within normal limits  URINE MICROSCOPIC-ADD ON - Abnormal; Notable for the following:    Squamous Epithelial / LPF FEW (*)    Bacteria, UA MANY (*)    All other components within normal limits  GLUCOSE, CAPILLARY - Abnormal; Notable for the following:    Glucose-Capillary 232 (*)    All other components within normal limits  GLUCOSE, CAPILLARY - Abnormal;  Notable for the following:    Glucose-Capillary 154 (*)    All other components within normal limits  CBG MONITORING, ED - Abnormal; Notable for the following:    Glucose-Capillary 259 (*)    All other components within normal limits  WOUND CULTURE  HIV ANTIBODY (ROUTINE TESTING)    Imaging Review No results found.   EKG Interpretation None      MDM   Final diagnoses:  None    33 y.o. female with pertinent PMH of DM, CKD, chronic LE edema presents with R leg swelling and erythema without systemic symptoms.  On arrival vitals and physical exam as above concerning for cellulitis, however calf significant swollen on same side, so will obtain DVT study.    DVT study negative with significant limitations.  Likely cellulitis.  Given DM and severity of swelling, admitted for abx and monitoring.   I have reviewed all laboratory and imaging studies if ordered as above  No diagnosis found.      Mirian Mo, MD 09/22/14 [phone]

## 2014-09-21 NOTE — ED Provider Notes (Signed)
MSE was initiated and I personally evaluated the patient and placed orders (if any) at  4:06 PM on September 21, 2014.  Pt is a 33yo female with hx of CHF, DM, HTN, hyperlipidemia, CKD, and acute combined systolic and diastolic congestive heart failure, presenting to ED with c/o right foot pain and swelling that started yesterday, associated with increased redness and swelling of right foot and leg. Pt denies chest pain or SOB.    On exam, Right leg edema is 4+ from foot to knee with erythema and warmth of right foot and lower leg.  Centralized circular area of erythema 2 x 2cm on dorsum of right foot, likely source of cellulitis. No edema or erythema of left foot or leg.   Due to pt hx and extensive edema of right leg, concern for cellulitis vs CHF vs DVT.  Believe pt would benefit from additional workup.  Discussed pt with Dr. Littie Deeds who agreed to assume care of pt, will place orders and f/u with pt.   The patient appears stable so that the remainder of the MSE may be completed by another provider.  Junius Finner, PA-C 09/21/14 [phone]

## 2014-09-21 NOTE — ED Notes (Addendum)
Pt states that 2 days ago her right foot started hurting/swelling. Foot began to turn red x 1 day ago. Pt denies injury. Pt is diabetic and has high blood pressure and takes insulin and antihypertensives. Pt stated she is on lasix and also had a recent cold/illness.

## 2014-09-21 NOTE — Procedures (Signed)
Incision and Drainage Procedure Note  Pre-operative Diagnosis: right foot cellulitis with abscess  Post-operative Diagnosis: same  Indications: abscess  Anesthesia: 2% plain lidocaine, 10 cc.  Procedure Details  The procedure, risks and complications have been discussed in detail (including, but not limited to airway compromise, infection, bleeding) with the patient, and the patient has signed consent to the procedure.  The skin was sterilely prepped and draped over the affected area in the usual fashion. After adequate local anesthesia, I&D with a #11 blade was performed on the right dorsal foot. Purulent drainage: present, a culture of the purulence material was obtained. The patient was observed until stable. Sterile dressings were applied.  Findings: Purulent abscess drained and culture sent.   EBL: <10 cc's  Drains: none  Condition: Tolerated procedure well   Complications: pain.   Procedure was completed under direct supervision of Dr. Littie Deeds.

## 2014-09-22 ENCOUNTER — Encounter (HOSPITAL_COMMUNITY): Payer: Self-pay | Admitting: *Deleted

## 2014-09-22 LAB — GLUCOSE, CAPILLARY: GLUCOSE-CAPILLARY: 154 mg/dL — AB (ref 70–99)

## 2014-09-22 LAB — HIV ANTIBODY (ROUTINE TESTING W REFLEX): HIV 1&2 Ab, 4th Generation: NONREACTIVE

## 2014-09-22 MED ORDER — DOXYCYCLINE HYCLATE 100 MG PO TABS: 100.0000 mg | ORAL_TABLET | Freq: Two times a day (BID) | ORAL | Status: DC

## 2014-09-22 NOTE — Discharge Instructions (Signed)
Take doxycycline 100 mg once tonight and then twice a day for 9 more days.   Call your PCP or go to the ED if your wound worsens.   Cellulitis Cellulitis is an infection of the skin and the tissue under the skin. The infected area is usually red and tender. This happens most often in the arms and lower legs. HOME CARE   Take your antibiotic medicine as told. Finish the medicine even if you start to feel better.  Keep the infected arm or leg raised (elevated).  Put a warm cloth on the area up to 4 times per day.  Only take medicines as told by your doctor.  Keep all doctor visits as told. GET HELP IF:  You see red streaks on the skin coming from the infected area.  Your red area gets bigger or turns a dark color.  Your bone or joint under the infected area is painful after the skin heals.  Your infection comes back in the same area or different area.  You have a puffy (swollen) bump in the infected area.  You have new symptoms.  You have a fever. GET HELP RIGHT AWAY IF:   You feel very sleepy.  You throw up (vomit) or have watery poop (diarrhea).  You feel sick and have muscle aches and pains. MAKE SURE YOU:   Understand these instructions.  Will watch your condition.  Will get help right away if you are not doing well or get worse. Document Released: 03/03/2008 Document Revised: 01/30/2014 Document Reviewed: 12/01/2011 [facility] Patient Information 2015 [place], [place]. This information is not intended to replace advice given to you by your health care provider. Make sure you discuss any questions you have with your health care provider.

## 2014-09-22 NOTE — Progress Notes (Signed)
UR completed 

## 2014-09-22 NOTE — Progress Notes (Signed)
Patient discharge teaching given, including activity, diet, follow-up appoints, and medications. Patient verbalized understanding of all discharge instructions. IV access was d/c'd. Vitals are stable. Skin is intact except as charted in most recent assessments. Pt to be escorted out by NT, to be driven home by family.  Samai Corea, MBA, BS, RN 

## 2014-09-22 NOTE — Progress Notes (Signed)
Subjective: Has no complaints today. Feels better than yesterday.   Objective: Vital signs in last 24 hours: Filed Vitals:   09/21/14 2230 09/21/14 2308 09/22/14 0531 09/22/14 0906  BP: 168/81 199/97 145/85   Pulse: 109 116 99   Temp:  99.1 F (37.3 C) 98.5 F (36.9 C)   TempSrc:  Oral Oral   Resp:  17 16   Height:  5\' 9"  (1.753 m)    Weight:  312 lb 8 oz (141.75 kg)    SpO2: 99% 97% 98% 97%   General- NAD Lungs- CTAB Cardiac- RRR, no murmurs GI- active bowel sounds, soft, obese Ext- able to wiggle toes on rt foot, rt dorsal wound clean with bloody drainage on dressing.  Neuro- CN 2-12 grossly intact  Lab Results: Basic Metabolic Panel:  Recent Labs Lab 09/21/14 1709  NA 135  K 4.7  CL 104  CO2 22  GLUCOSE 244*  BUN 56*  CREATININE 3.88*  CALCIUM 7.6*   Liver Function Tests:  Recent Labs Lab 09/21/14 1709  AST 19  ALT 19  ALKPHOS 73  BILITOT 0.6  PROT 5.4*  ALBUMIN 2.0*   No results for input(s): LIPASE, AMYLASE in the last 168 hours. No results for input(s): AMMONIA in the last 168 hours. CBC:  Recent Labs Lab 09/21/14 1709  WBC 9.7  NEUTROABS 7.2  HGB 9.5*  HCT 29.5*  MCV 85.3  PLT 257  CBG:  Recent Labs Lab 09/21/14 1604 09/21/14 2311 09/22/14 0801  GLUCAP 259* 232* 154*   Urinalysis:  Recent Labs Lab 09/21/14 1908  COLORURINE YELLOW  LABSPEC 1.012  PHURINE 6.5  GLUCOSEU 250*  HGBUR MODERATE*  BILIRUBINUR NEGATIVE  KETONESUR NEGATIVE  PROTEINUR >300*  UROBILINOGEN 0.2  NITRITE NEGATIVE  LEUKOCYTESUR NEGATIVE   Medications: I have reviewed the patient's current medications. Scheduled Meds: . atorvastatin  40 mg Oral Daily  . budesonide  0.25 mg Nebulization BID  . carvedilol  25 mg Oral BID WC  . doxycycline  100 mg Oral Q12H  . furosemide  160 mg Oral BID  . heparin  5,000 Units Subcutaneous 3 times per day  . hydrALAZINE  37.5 mg Oral 3 times per day  . insulin aspart  0-15 Units Subcutaneous TID WC  .  insulin aspart  0-5 Units Subcutaneous QHS  . insulin aspart  6 Units Subcutaneous TID WC  . insulin glargine  20 Units Subcutaneous QHS  . 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 - continue home meds: coreg, hydralazine, imdur. - continue lasix 160mg  BID  DM II - uncontrolled.  Takes lantus 20 qhs and 15 units TID with meals at home. - will do 20 u lantus + 6 TID with meals + SSI here. resume home dose on d/c.  HTN -  - cont hydralazine + imdur TID, Coreg BID.   CKD stage IV - GFR around baseline. Crt 3.88, baseline ~ 3.69.  -  [place].  Anemia, normocytic - likely 2/2 to CKD. hgb overall stable around 9.5, baseline around 10-11.  HLD - cont lipitor.  Dispo: d/c home today  The patient does have a current PCP (No Pcp Per Patient) and does not [facility] after discharge. Follows w/ [facility]  The patient does not have transportation limitations that hinder transportation to clinic appointments.  .Services Needed at time of discharge: Y = Yes, Blank = No PT:   OT:   RN:   Equipment:   Other:     LOS: 1 day   Beth Kroneriana Evander Macaraeg, MD 09/22/2014, 9:14 AM

## 2014-09-22 NOTE — Discharge Summary (Signed)
Name: [name] MRN: [id number] DOB: 10/08/1980 33 y.o. 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     albuterol (2.5 MG/3ML) 0.083% nebulizer solution  Commonly known as:  PROVENTIL  Take 2.5 mg by nebulization every 6 (six) hours as needed for wheezing or shortness of breath.     atorvastatin 40 MG tablet  Commonly known as:  LIPITOR  Take 1 tablet (40 mg total) by mouth daily.     budesonide 0.25 MG/2ML nebulizer solution  Commonly known as:  PULMICORT  Take 2 mLs (0.25 mg total) by nebulization 2 (two) times daily.     carvedilol 25 MG tablet  Commonly known as:  COREG  1 tablet PO BID.     doxycycline 100 MG tablet  Commonly known as:  VIBRA-TABS  Take 1 tablet (100 mg total) by mouth 2 (two) times daily.     furosemide 80 MG tablet  Commonly known as:  LASIX  Take 160 mg by mouth 2 (two) times daily. 1 1/2 tablet 2 x daily     hydrALAZINE 25 MG tablet  Commonly known as:  APRESOLINE  Take 1.5 tablets (37.5 mg total) by mouth every 8 (eight) hours.     HYDROcodone-acetaminophen 5-325 MG per tablet  Commonly known as:  NORCO/VICODIN  1 to 2 tabs every 4 to 6 hours as needed for pain.     insulin aspart cartridge  Commonly known as:  NOVOLOG PENFILL  Inject 10 Units into the skin 3 (three) times  daily with meals.     insulin glargine 100 UNIT/ML injection  Commonly known as:  LANTUS  Inject 0.2 mLs (20 Units total) into the skin at bedtime.     isosorbide dinitrate 20 MG tablet  Commonly known as:  ISORDIL  TAKE 1 TABLET BY MOUTH THREE TIMES DAILY        Disposition and follow-up:   [facility].  At the hospital follow up visit please address:  1.  Please ensure rt lower extremity cellulitis is resolving.   2.  Labs / imaging needed at time of follow-up: none  3.  Pending labs/ test needing follow-up: wound culture     Procedures Performed:  I&D of rt foot abscess.   Admission HPI: 33 yo female with DM II, HTN, asthma, HLD, combined CHF EF 35-40%, CKD stage IV, chronic LE edema here with R leg swelling.  Right foot started hurting/swelling 2 days ago, and turned red 1 day ago. Denies any injury, bug bites, rashes, open wounds anywhere else. Denies any fever/chills. Denies any calf pain, hx of blood clots. Has some dry cough and nasal congestion. No SOB, CP, n/v. Was doing well prior to this incident. Has chronic lower extremity swelling at baseline  with no changes recently in the baseline swelling or her weight. Is on lasix 160 BID for chronic LE edema. EF 35-40% on ECHO 08/2013. Follows up with Dr. Lacy Duverney nephrology for most of her primary care. Has been compliant with all of her meds. Denies any dysuria.  Doesn't smoke, drink, or use any drugs. Doing well otherwise except the foot pain.  Doppler study was done in the ED and it was limited due to edema and body habitus.   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 RLE cellulitis-- I&D of abscess on dorsum of rt foot performed by Dr. Tasia Catchings in the ED. She also got a dose of clindamycin in the ED. On exam the next day wound appeared clean with no active draining. Pt was given doxy (sulfa allergy) 100mg  BID x 9 days for a total of 10 days of abx coverage. Pt takes norco at home for rt shoulder pain which should control foot pain as well. On day of d/c pt felt cellulitis had improved from admission.    Discharge Vitals:   BP 145/85 mmHg  Pulse 99  Temp(Src) 98.5 F (36.9 C) (Oral)  Resp 16  Ht 5\' 9"  (1.753 m)  Wt 312 lb 8 oz (141.75 kg)  BMI 46.13 kg/m2  SpO2 97%  LMP 08/28/2014  Discharge Labs:  Results for orders placed or performed during the hospital encounter of 09/21/14 (from the past 24 hour(s))  CBG monitoring, ED     Status: Abnormal   Collection Time: 09/21/14  4:04 PM  Result Value Ref Range   Glucose-Capillary 259 (H) 70 - 99 mg/dL  CBC with Differential     Status: Abnormal   Collection Time: 09/21/14  5:09 PM  Result Value Ref Range   WBC 9.7 4.0 - 10.5 K/uL   RBC 3.46 (L) 3.87 - 5.11 MIL/uL   Hemoglobin 9.5 (L) 12.0 - 15.0 g/dL   HCT 16.1 (L) 09.6 - 04.5 %   MCV 85.3 78.0 - 100.0 fL   MCH 27.5 26.0 - 34.0 pg   MCHC 32.2 30.0 - 36.0 g/dL   RDW 40.9 81.1 - 91.4 %   Platelets 257 150 - 400 K/uL   Neutrophils Relative % 73 43 - 77 %   Neutro Abs 7.2 1.7 - 7.7 K/uL   Lymphocytes Relative 16 12 - 46 %   Lymphs Abs 1.5 0.7 - 4.0 K/uL   Monocytes Relative 8 3 - 12 %   Monocytes Absolute 0.8 0.1 - 1.0 K/uL   Eosinophils Relative 2 0 - 5 %   Eosinophils Absolute 0.2 0.0 - 0.7 K/uL   Basophils Relative 1 0 - 1 %   Basophils Absolute 0.1 0.0 - 0.1 K/uL  Comprehensive metabolic panel     Status: Abnormal   Collection Time: 09/21/14  5:09 PM  Result Value Ref Range   Sodium 135 135 - 145 mmol/L   Potassium 4.7 3.5 - 5.1 mmol/L   Chloride 104 96 - 112 mEq/L   CO2 22 19 - 32 mmol/L   Glucose, Bld 244 (H) 70 - 99 mg/dL    BUN 56 (H) 6 - 23 mg/dL   Creatinine, Ser 7.82 (H) 0.50 -  1.10 mg/dL   Calcium 7.6 (L) 8.4 - 10.5 mg/dL   Total Protein 5.4 (L) 6.0 - 8.3 g/dL   Albumin 2.0 (L) 3.5 - 5.2 g/dL   AST 19 0 - 37 U/L   ALT 19 0 - 35 U/L   Alkaline Phosphatase 73 39 - 117 U/L   Total Bilirubin 0.6 0.3 - 1.2 mg/dL   GFR calc non Af Amer 14 (L) >90 mL/min   GFR calc Af Amer 16 (L) >90 mL/min   Anion gap 9 5 - 15  Urinalysis, Routine w reflex microscopic     Status: Abnormal   Collection Time: 09/21/14  7:08 PM  Result Value Ref Range   Color, Urine YELLOW YELLOW   APPearance CLOUDY (A) CLEAR   Specific Gravity, Urine 1.012 1.005 - 1.030   pH 6.5 5.0 - 8.0   Glucose, UA 250 (A) NEGATIVE mg/dL   Hgb urine dipstick MODERATE (A) NEGATIVE   Bilirubin Urine NEGATIVE NEGATIVE   Ketones, ur NEGATIVE NEGATIVE mg/dL   Protein, ur >734 (A) NEGATIVE mg/dL   Urobilinogen, UA 0.2 0.0 - 1.0 mg/dL   Nitrite NEGATIVE NEGATIVE   Leukocytes, UA NEGATIVE NEGATIVE  Urine microscopic-add on     Status: Abnormal   Collection Time: 09/21/14  7:08 PM  Result Value Ref Range   Squamous Epithelial / LPF FEW (A) RARE   WBC, UA 0-2 <3 WBC/hpf   RBC / HPF 3-6 <3 RBC/hpf   Bacteria, UA MANY (A) RARE  Glucose, capillary     Status: Abnormal   Collection Time: 09/21/14 11:11 PM  Result Value Ref Range   Glucose-Capillary 232 (H) 70 - 99 mg/dL  Glucose, capillary     Status: Abnormal   Collection Time: 09/22/14  8:01 AM  Result Value Ref Range   Glucose-Capillary 154 (H) 70 - 99 mg/dL   Comment 1 Notify RN    Comment 2 Documented in Chart     Signed: Gara Kroner, MD 09/22/2014, 9:19 AM    Services Ordered on Discharge: none Equipment Ordered on Discharge: none

## 2014-09-24 LAB — WOUND CULTURE

## 2014-11-07 ENCOUNTER — Other Ambulatory Visit (HOSPITAL_COMMUNITY): Payer: Self-pay | Admitting: *Deleted

## 2014-11-08 ENCOUNTER — Ambulatory Visit (HOSPITAL_COMMUNITY)
Admission: RE | Admit: 2014-11-08 | Discharge: 2014-11-08 | Disposition: A | Discharge status: Home / Self Care | Payer: Medicaid Other | Source: Ambulatory Visit | Attending: Nephrology | Admitting: Nephrology

## 2014-11-08 DIAGNOSIS — D509 Iron deficiency anemia, unspecified: Secondary | ICD-10-CM | POA: Diagnosis not present

## 2014-11-08 MED ORDER — SODIUM CHLORIDE 0.9 % IV SOLN
1020.0000 mg | Freq: Once | INTRAVENOUS | Status: AC
  Administered 2014-11-08: 1020 mg via INTRAVENOUS
  Filled 2014-11-08: qty 34

## 2014-11-16 ENCOUNTER — Other Ambulatory Visit: Payer: Self-pay | Admitting: Internal Medicine

## 2015-01-29 ENCOUNTER — Other Ambulatory Visit (HOSPITAL_COMMUNITY): Payer: Self-pay | Admitting: *Deleted

## 2015-01-30 ENCOUNTER — Encounter (HOSPITAL_COMMUNITY)
Admission: RE | Admit: 2015-01-30 | Discharge: 2015-01-30 | Disposition: A | Discharge status: Home / Self Care | Payer: Medicaid Other | Source: Ambulatory Visit | Attending: Nephrology | Admitting: Nephrology

## 2015-01-30 DIAGNOSIS — Z79899 Other long term (current) drug therapy: Secondary | ICD-10-CM | POA: Insufficient documentation

## 2015-01-30 DIAGNOSIS — D509 Iron deficiency anemia, unspecified: Secondary | ICD-10-CM | POA: Diagnosis not present

## 2015-01-30 DIAGNOSIS — N183 Chronic kidney disease, stage 3 (moderate): Secondary | ICD-10-CM | POA: Diagnosis not present

## 2015-01-30 DIAGNOSIS — D631 Anemia in chronic kidney disease: Secondary | ICD-10-CM | POA: Insufficient documentation

## 2015-01-30 DIAGNOSIS — Z5181 Encounter for therapeutic drug level monitoring: Secondary | ICD-10-CM | POA: Diagnosis not present

## 2015-01-30 LAB — POCT HEMOGLOBIN-HEMACUE: HEMOGLOBIN: 8.6 g/dL — AB (ref 12.0–15.0)

## 2015-01-30 MED ORDER — EPOETIN ALFA 20000 UNIT/ML IJ SOLN
20000.0000 [IU] | INTRAMUSCULAR | Status: DC
  Administered 2015-01-30: 20000 [IU] via SUBCUTANEOUS

## 2015-01-30 MED ORDER — EPOETIN ALFA 20000 UNIT/ML IJ SOLN
INTRAMUSCULAR | Status: AC
  Filled 2015-01-30: qty 1

## 2015-01-30 MED ORDER — SODIUM CHLORIDE 0.9 % IV SOLN
510.0000 mg | INTRAVENOUS | Status: DC
  Administered 2015-01-30: 510 mg via INTRAVENOUS
  Filled 2015-01-30: qty 17

## 2015-01-31 ENCOUNTER — Other Ambulatory Visit: Payer: Self-pay | Admitting: *Deleted

## 2015-01-31 DIAGNOSIS — N183 Chronic kidney disease, stage 3 unspecified: Secondary | ICD-10-CM

## 2015-01-31 DIAGNOSIS — Z01810 Encounter for preprocedural cardiovascular examination: Secondary | ICD-10-CM

## 2015-02-06 ENCOUNTER — Encounter (HOSPITAL_COMMUNITY)
Admission: RE | Admit: 2015-02-06 | Discharge: 2015-02-06 | Disposition: A | Discharge status: Home / Self Care | Payer: Medicaid Other | Source: Ambulatory Visit | Attending: Nephrology | Admitting: Nephrology

## 2015-02-06 DIAGNOSIS — N183 Chronic kidney disease, stage 3 (moderate): Secondary | ICD-10-CM | POA: Diagnosis not present

## 2015-02-06 MED ORDER — SODIUM CHLORIDE 0.9 % IV SOLN
510.0000 mg | INTRAVENOUS | Status: AC
  Administered 2015-02-06: 510 mg via INTRAVENOUS
  Filled 2015-02-06: qty 17

## 2015-02-13 ENCOUNTER — Encounter (HOSPITAL_COMMUNITY)
Admission: RE | Admit: 2015-02-13 | Discharge: 2015-02-13 | Disposition: A | Discharge status: Home / Self Care | Payer: Medicaid Other | Source: Ambulatory Visit | Attending: Nephrology | Admitting: Nephrology

## 2015-02-13 DIAGNOSIS — N183 Chronic kidney disease, stage 3 (moderate): Secondary | ICD-10-CM | POA: Diagnosis not present

## 2015-02-13 LAB — POCT HEMOGLOBIN-HEMACUE: HEMOGLOBIN: 9.2 g/dL — AB (ref 12.0–15.0)

## 2015-02-13 MED ORDER — EPOETIN ALFA 20000 UNIT/ML IJ SOLN
20000.0000 [IU] | INTRAMUSCULAR | Status: DC
  Administered 2015-02-13: 20000 [IU] via SUBCUTANEOUS

## 2015-02-13 MED ORDER — EPOETIN ALFA 20000 UNIT/ML IJ SOLN
INTRAMUSCULAR | Status: AC
Start: 2015-02-13 — End: 2015-02-13
  Administered 2015-02-13: 20000 [IU] via SUBCUTANEOUS
  Filled 2015-02-13: qty 1

## 2015-02-23 ENCOUNTER — Encounter: Payer: Self-pay | Admitting: Vascular Surgery

## 2015-02-27 ENCOUNTER — Encounter (HOSPITAL_COMMUNITY): Payer: Medicaid Other

## 2015-02-27 ENCOUNTER — Other Ambulatory Visit: Payer: Self-pay | Admitting: Internal Medicine

## 2015-02-28 ENCOUNTER — Ambulatory Visit (HOSPITAL_COMMUNITY)
Admission: RE | Admit: 2015-02-28 | Discharge: 2015-02-28 | Disposition: A | Discharge status: Home / Self Care | Payer: Medicaid Other | Source: Ambulatory Visit | Attending: Vascular Surgery | Admitting: Vascular Surgery

## 2015-02-28 ENCOUNTER — Ambulatory Visit (INDEPENDENT_AMBULATORY_CARE_PROVIDER_SITE_OTHER): Payer: Medicaid Other | Admitting: Vascular Surgery

## 2015-02-28 ENCOUNTER — Ambulatory Visit (INDEPENDENT_AMBULATORY_CARE_PROVIDER_SITE_OTHER)
Admission: RE | Admit: 2015-02-28 | Discharge: 2015-02-28 | Disposition: A | Discharge status: Home / Self Care | Payer: Medicaid Other | Source: Ambulatory Visit | Attending: Vascular Surgery | Admitting: Vascular Surgery

## 2015-02-28 VITALS — BP 152/96 | HR 92 | Temp 98.0°F | Resp 20 | Ht 68.0 in | Wt 330.0 lb

## 2015-02-28 DIAGNOSIS — Z01810 Encounter for preprocedural cardiovascular examination: Secondary | ICD-10-CM

## 2015-02-28 DIAGNOSIS — Z01818 Encounter for other preprocedural examination: Secondary | ICD-10-CM | POA: Insufficient documentation

## 2015-02-28 DIAGNOSIS — N183 Chronic kidney disease, stage 3 unspecified: Secondary | ICD-10-CM

## 2015-02-28 DIAGNOSIS — N184 Chronic kidney disease, stage 4 (severe): Secondary | ICD-10-CM

## 2015-02-28 NOTE — Progress Notes (Signed)
 [facility] [place]  Patient name: [name] MRN: [id number] DOB: 03/08/1981 Sex: female  REASON FOR CONSULT: Evaluate for hemodialysis access. Referred by Dr. Goldsborough.   HPI: [name] is a 33 y.o. female who is not yet on dialysis. She was referred for evaluation for hemodialysis access. She is right-handed.  I have reviewed her records that were sent from [facility]. She does have a history of hypertension, hyperlipidemia, anemia of chronic disease, diabetes, and chronic kidney disease. BMI is 51. GFR on 01/22/2015 was 12.  She denies any recent uremic symptoms. Specifically she denies nausea, vomiting, fatigue, anorexia, or palpitations.   Past Medical History  Diagnosis Date  . Hypertension   . Asthma   . Hyperlipidemia   . Pneumonia     "couple times; have it now" (09/15/2013)  . Chronic bronchitis     "just about q yr" (09/15/2013)  . Shortness of breath     "just recently; related to the pneumonia" (09/15/2013)  . Type II diabetes mellitus   . Anemia   . Migraine     "get them alot" (09/15/2013)  . Arthritis     "left hand" (09/15/2013)  . Chronic kidney disease     "low kidney function" (09/15/2013)   Family History  Problem Relation Age of Onset  . Diabetes Mother   . Stroke Mother   . Diabetes Maternal Grandmother   . Cancer Paternal Grandmother   . Hypertension Father   . Hyperlipidemia Father    SOCIAL HISTORY: History  Substance Use Topics  . Smoking status: Never Smoker   . Smokeless tobacco: Never Used  . Alcohol Use: No   Allergies  Allergen Reactions  . Aspirin Anaphylaxis  . Sulfur Hives  . Tramadol Other (See Comments)    Pt states she feels weird    Current Outpatient Prescriptions  Medication Sig Dispense Refill  . albuterol (PROVENTIL HFA;VENTOLIN HFA) 108 (90 BASE) MCG/ACT inhaler Inhale into the lungs every 6 (six) hours as needed for wheezing or shortness of breath.    . albuterol  (PROVENTIL) (2.5 MG/3ML) 0.083% nebulizer solution Take 2.5 mg by nebulization every 6 (six) hours as needed for wheezing or shortness of breath.    . atorvastatin (LIPITOR) 40 MG tablet Take 1 tablet (40 mg total) by mouth daily. 30 tablet 3  . budesonide (PULMICORT) 0.25 MG/2ML nebulizer solution Take 2 mLs (0.25 mg total) by nebulization 2 (two) times daily. 60 mL 12  . carvedilol (COREG) 25 MG tablet 1 tablet PO BID. (Patient taking differently: Take 25 mg by mouth 2 (two) times daily with a meal. 1 tablet PO BID.) 60 tablet 0  . doxycycline (VIBRA-TABS) 100 MG tablet Take 1 tablet (100 mg total) by mouth 2 (two) times daily. 19 tablet 0  . furosemide (LASIX) 80 MG tablet Take 160 mg by mouth 2 (two) times daily. 1 1/2 tablet 2 x daily    . hydrALAZINE (APRESOLINE) 25 MG tablet Take 1.5 tablets (37.5 mg total) by mouth every 8 (eight) hours. 120 tablet 0  . HYDROcodone-acetaminophen (NORCO/VICODIN) 5-325 MG per tablet 1 to 2 tabs every 4 to 6 hours as needed for pain. 20 tablet 0  . insulin aspart (NOVOLOG PENFILL) cartridge Inject 10 Units into the skin 3 (three) times daily with meals. (Patient taking differently: Inject 15 Units into the skin 3 (three) times daily with meals. ) 15 mL 11  . insulin glargine (LANTUS) 100 UNIT/ML injection Inject 0.2 mLs (  20 Units total) into the skin at bedtime. 10 mL 12  . isosorbide dinitrate (ISORDIL) 20 MG tablet TAKE 1 TABLET BY MOUTH THREE TIMES DAILY 270 tablet 0   No current facility-administered medications for this visit.   REVIEW OF SYSTEMS: Arly.Keller ] denotes positive finding; [  ] denotes negative finding  CARDIOVASCULAR:  [ ]  chest pain   [ ]  chest pressure   [ ]  palpitations   [ ]  orthopnea   [ ]  dyspnea on exertion   [ ]  claudication   [ ]  rest pain   [ ]  DVT   [ ]  phlebitis PULMONARY:   [ ]  productive cough   Arly.Keller ] asthma   [ ]  wheezing NEUROLOGIC:   [ ]  weakness  [ ]  paresthesias  [ ]  aphasia  [ ]  amaurosis  [ ]  dizziness HEMATOLOGIC:   [ ]   bleeding problems   [ ]  clotting disorders MUSCULOSKELETAL:  [ ]  joint pain   [ ]  joint swelling [ ]  leg swelling GASTROINTESTINAL: [ ]   blood in stool  [ ]   hematemesis GENITOURINARY:  [ ]   dysuria  [ ]   hematuria PSYCHIATRIC:  [ ]  history of major depression INTEGUMENTARY:  [ ]  rashes  [ ]  ulcers CONSTITUTIONAL:  [ ]  fever   [ ]  chills  PHYSICAL EXAM: There were no vitals filed for this visit. GENERAL: The patient is a well-nourished female, in no acute distress. The vital signs are documented above. CARDIOVASCULAR: There is a regular rate and rhythm. Do not detect carotid bruits. She has palpable radial pulses bilaterally. PULMONARY: There is good air exchange bilaterally without wheezing or rales. ABDOMEN: Soft and non-tender with normal pitched bowel sounds.  MUSCULOSKELETAL: There are no major deformities or cyanosis. NEUROLOGIC: No focal weakness or paresthesias are detected. SKIN: There are no ulcers or rashes noted. PSYCHIATRIC: The patient has a normal affect.  DATA:  I have independently interpreted her arterial Doppler study today which shows triphasic Doppler signals in the radial and ulnar positions bilaterally.  I have independently interpreted her upper extremity vein mapping which shows that the forearm cephalic vein and basilic vein look reasonable in size all of the basilic vein is fairly short. In the left arm the forearm cephalic vein and upper arm basilic vein look reasonable in size. The upper arm cephalic vein on the left is not visualized.  Her GFR in December was 16.  MEDICAL ISSUES:  STAGE IV CHRONIC KIDNEY DISEASE: She appears to be a reasonable candidate for a left radiocephalic AV fistula or left basilic vein transposition. If neither are adequate she would require placement of an AV graft. I have explained the indications for placement of an AV fistula or AV graft. I've explained that if at all possible we will place an AV fistula.  I have reviewed the risks  of placement of an AV fistula including but not limited to: failure of the fistula to mature, need for subsequent interventions, and thrombosis. In addition I have reviewed the potential complications of placement of an AV graft. These risks include, but are not limited to, graft thrombosis, graft infection, wound healing problems, bleeding, arm swelling, and steal syndrome. All the patient's questions were answered and they are agreeable to proceed with surgery. Her surgery is scheduled for 03/27/2015.   Waverly Ferrari [facility] [place]: [phone]

## 2015-03-01 ENCOUNTER — Other Ambulatory Visit: Payer: Self-pay

## 2015-03-05 ENCOUNTER — Encounter (HOSPITAL_COMMUNITY)
Admission: RE | Admit: 2015-03-05 | Discharge: 2015-03-05 | Disposition: A | Discharge status: Home / Self Care | Payer: Medicaid Other | Source: Ambulatory Visit | Attending: Nephrology | Admitting: Nephrology

## 2015-03-05 DIAGNOSIS — D509 Iron deficiency anemia, unspecified: Secondary | ICD-10-CM | POA: Insufficient documentation

## 2015-03-05 DIAGNOSIS — D631 Anemia in chronic kidney disease: Secondary | ICD-10-CM | POA: Insufficient documentation

## 2015-03-05 DIAGNOSIS — Z79899 Other long term (current) drug therapy: Secondary | ICD-10-CM | POA: Insufficient documentation

## 2015-03-05 DIAGNOSIS — N183 Chronic kidney disease, stage 3 (moderate): Secondary | ICD-10-CM | POA: Diagnosis present

## 2015-03-05 DIAGNOSIS — Z5181 Encounter for therapeutic drug level monitoring: Secondary | ICD-10-CM | POA: Insufficient documentation

## 2015-03-05 LAB — RENAL FUNCTION PANEL
ALBUMIN: 2.8 g/dL — AB (ref 3.5–5.0)
Anion gap: 9 (ref 5–15)
BUN: 55 mg/dL — ABNORMAL HIGH (ref 6–20)
CALCIUM: 7.6 mg/dL — AB (ref 8.9–10.3)
CO2: 20 mmol/L — ABNORMAL LOW (ref 22–32)
CREATININE: 5.43 mg/dL — AB (ref 0.44–1.00)
Chloride: 107 mmol/L (ref 101–111)
GFR calc Af Amer: 11 mL/min — ABNORMAL LOW (ref >60)
GFR calc non Af Amer: 9 mL/min — ABNORMAL LOW (ref >60)
Glucose, Bld: 136 mg/dL — ABNORMAL HIGH (ref 65–99)
PHOSPHORUS: 6.7 mg/dL — AB (ref 2.5–4.6)
POTASSIUM: 4.2 mmol/L (ref 3.5–5.1)
Sodium: 136 mmol/L (ref 135–145)

## 2015-03-05 LAB — IRON AND TIBC
IRON: 60 ug/dL (ref 28–170)
SATURATION RATIOS: 28 % (ref 10.4–31.8)
TIBC: 216 ug/dL — ABNORMAL LOW (ref 250–450)
UIBC: 156 ug/dL

## 2015-03-05 LAB — POCT HEMOGLOBIN-HEMACUE: Hemoglobin: 9.6 g/dL — ABNORMAL LOW (ref 12.0–15.0)

## 2015-03-05 LAB — FERRITIN: FERRITIN: 371 ng/mL — AB (ref 11–307)

## 2015-03-05 MED ORDER — EPOETIN ALFA 20000 UNIT/ML IJ SOLN
20000.0000 [IU] | INTRAMUSCULAR | Status: DC
  Administered 2015-03-05: 20000 [IU] via SUBCUTANEOUS

## 2015-03-06 LAB — PTH, INTACT AND CALCIUM
CALCIUM TOTAL (PTH): 7.5 mg/dL — AB (ref 8.7–10.2)
PTH: 212 pg/mL — ABNORMAL HIGH (ref 15–65)

## 2015-03-19 ENCOUNTER — Encounter (HOSPITAL_COMMUNITY)
Admission: RE | Admit: 2015-03-19 | Discharge: 2015-03-19 | Disposition: A | Discharge status: Home / Self Care | Payer: Medicaid Other | Source: Ambulatory Visit | Attending: Vascular Surgery | Admitting: Vascular Surgery

## 2015-03-19 ENCOUNTER — Encounter (HOSPITAL_COMMUNITY): Payer: Self-pay

## 2015-03-19 ENCOUNTER — Encounter (HOSPITAL_COMMUNITY)
Admission: RE | Admit: 2015-03-19 | Discharge: 2015-03-19 | Disposition: A | Discharge status: Home / Self Care | Payer: Medicaid Other | Source: Ambulatory Visit | Attending: Nephrology | Admitting: Nephrology

## 2015-03-19 DIAGNOSIS — R9431 Abnormal electrocardiogram [ECG] [EKG]: Secondary | ICD-10-CM | POA: Insufficient documentation

## 2015-03-19 DIAGNOSIS — E785 Hyperlipidemia, unspecified: Secondary | ICD-10-CM | POA: Insufficient documentation

## 2015-03-19 DIAGNOSIS — Z01812 Encounter for preprocedural laboratory examination: Secondary | ICD-10-CM | POA: Diagnosis not present

## 2015-03-19 DIAGNOSIS — J45909 Unspecified asthma, uncomplicated: Secondary | ICD-10-CM | POA: Insufficient documentation

## 2015-03-19 DIAGNOSIS — I2510 Atherosclerotic heart disease of native coronary artery without angina pectoris: Secondary | ICD-10-CM | POA: Diagnosis not present

## 2015-03-19 DIAGNOSIS — J449 Chronic obstructive pulmonary disease, unspecified: Secondary | ICD-10-CM | POA: Insufficient documentation

## 2015-03-19 DIAGNOSIS — I129 Hypertensive chronic kidney disease with stage 1 through stage 4 chronic kidney disease, or unspecified chronic kidney disease: Secondary | ICD-10-CM | POA: Insufficient documentation

## 2015-03-19 DIAGNOSIS — E1122 Type 2 diabetes mellitus with diabetic chronic kidney disease: Secondary | ICD-10-CM | POA: Insufficient documentation

## 2015-03-19 DIAGNOSIS — N189 Chronic kidney disease, unspecified: Secondary | ICD-10-CM | POA: Insufficient documentation

## 2015-03-19 DIAGNOSIS — N183 Chronic kidney disease, stage 3 (moderate): Secondary | ICD-10-CM | POA: Diagnosis not present

## 2015-03-19 DIAGNOSIS — Z01818 Encounter for other preprocedural examination: Secondary | ICD-10-CM | POA: Diagnosis not present

## 2015-03-19 HISTORY — DX: Chronic obstructive pulmonary disease, unspecified: J44.9

## 2015-03-19 LAB — BASIC METABOLIC PANEL
ANION GAP: 10 (ref 5–15)
BUN: 61 mg/dL — ABNORMAL HIGH (ref 6–20)
CO2: 19 mmol/L — ABNORMAL LOW (ref 22–32)
CREATININE: 5.05 mg/dL — AB (ref 0.44–1.00)
Calcium: 7.1 mg/dL — ABNORMAL LOW (ref 8.9–10.3)
Chloride: 103 mmol/L (ref 101–111)
GFR calc Af Amer: 12 mL/min — ABNORMAL LOW (ref >60)
GFR, EST NON AFRICAN AMERICAN: 10 mL/min — AB (ref >60)
Glucose, Bld: 270 mg/dL — ABNORMAL HIGH (ref 65–99)
Potassium: 4.4 mmol/L (ref 3.5–5.1)
SODIUM: 132 mmol/L — AB (ref 135–145)

## 2015-03-19 LAB — GLUCOSE, CAPILLARY: Glucose-Capillary: 265 mg/dL — ABNORMAL HIGH (ref 65–99)

## 2015-03-19 LAB — CBC
HCT: 29.1 % — ABNORMAL LOW (ref 36.0–46.0)
Hemoglobin: 9.4 g/dL — ABNORMAL LOW (ref 12.0–15.0)
MCH: 28.1 pg (ref 26.0–34.0)
MCHC: 32.3 g/dL (ref 30.0–36.0)
MCV: 87.1 fL (ref 78.0–100.0)
PLATELETS: 201 10*3/uL (ref 150–400)
RBC: 3.34 MIL/uL — ABNORMAL LOW (ref 3.87–5.11)
RDW: 13.6 % (ref 11.5–15.5)
WBC: 5.8 10*3/uL (ref 4.0–10.5)

## 2015-03-19 LAB — POCT HEMOGLOBIN-HEMACUE
HEMOGLOBIN: 11.4 g/dL — AB (ref 12.0–15.0)
Hemoglobin: 9.5 g/dL — ABNORMAL LOW (ref 12.0–15.0)

## 2015-03-19 LAB — HCG, SERUM, QUALITATIVE: Preg, Serum: NEGATIVE

## 2015-03-19 MED ORDER — EPOETIN ALFA 20000 UNIT/ML IJ SOLN
20000.0000 [IU] | INTRAMUSCULAR | Status: DC
  Administered 2015-03-19: 20000 [IU] via SUBCUTANEOUS

## 2015-03-19 MED ORDER — EPOETIN ALFA 20000 UNIT/ML IJ SOLN
INTRAMUSCULAR | Status: AC
  Administered 2015-03-19: 20000 [IU] via SUBCUTANEOUS
  Filled 2015-03-19: qty 1

## 2015-03-19 NOTE — Pre-Procedure Instructions (Addendum)
[name]  03/19/2015      [facility] DRUG [facility]ORE [address] [place], [place] - [address] AT [facility] OF [address] [place] [address] Phone: [phone] Fax: [phone]  [facility] [address] - [place], [place] - [address] [place] [address] Phone: [phone] Fax: [phone]  [facility]ORE [address] - [place], [place] - [address] AT [facility] OF [address] [place] [address] Phone: [phone] Fax: [phone]    Your procedure is scheduled on June 28.  Report to [facility] Admitting at 830 A.M.  Call this number if you have problems the morning of surgery:  [phone]   Remember:  Do not eat food or drink liquids after midnight.  Take these medicines the morning of surgery with A SIP OF WATER albuterol nebulizer and  Inhaler if needed-bring with you on the day of surgery, Pulmicort nebulizer if needed, coreg, Norco for pain if needed, Isosobide dinitrate (Isordil)  Stop taking aspirin, Ibuprofen Aleve, BC's, Goody's, Herbal medications, Fish Oil   Do not wear jewelry, make-up or nail polish.  Do not wear lotions, powders, or perfumes.  You may wear deodorant.  Do not shave 48 hours prior to surgery.  Men may shave face and neck.  Do not bring valuables to the hospital.  [facility] is not responsible for any belongings or valuables.  Contacts, dentures or bridgework may not be worn into surgery.  Leave your suitcase in the car.  After surgery it may be brought to your room.  For patients admitted to the hospital, discharge time will be determined by your treatment team.  Patients discharged the day of surgery will not be allowed to drive home.    Special instructions:   - Preparing for Surgery  Before surgery, you can play an important role.  Because skin is not sterile, your skin needs to be as free of  germs as possible.  You can reduce the number of germs on you skin by washing with CHG (chlorahexidine gluconate) soap before surgery.  CHG is an antiseptic cleaner which kills germs and bonds with the skin to continue killing germs even after washing.  Please DO NOT use if you have an allergy to CHG or antibacterial soaps.  If your skin becomes reddened/irritated stop using the CHG and inform your nurse when you arrive at Short Stay.  Do not shave (including legs and underarms) for at least 48 hours prior to the first CHG shower.  You may shave your face.  Please follow these instructions carefully:   1.  Shower with CHG Soap the night before surgery and the  morning of Surgery.  2.  If you choose to wash your hair, wash your hair first as usual with your normal shampoo.  3.  After you shampoo, rinse your hair and body thoroughly to remove the   Shampoo.  4.  Use CHG as you would any other liquid soap.  You can apply chg directly  to the skin and wash gently with scrungie or a clean washcloth.  5.  Apply the CHG Soap to your body ONLY FROM THE NECK DOWN.   Do not use on open wounds or open sores.  Avoid contact with your eyes,  ears, mouth and genitals (private parts).  Wash genitals (private parts)  with your normal soap.  6.  Wash thoroughly, paying special attention to the area where your surgery  will be performed.  7.  Thoroughly rinse your body with warm water from the neck down.  8.  DO NOT shower/wash with your normal soap after using and rinsing off  the CHG Soap.  9.  Pat yourself dry with a clean towel.            10.  Wear clean pajamas.            11.  Place clean sheets on your bed the night of your first shower and do not   sleep with pets.  Day of Surgery  Do not apply any lotions/deoderants the morning of surgery.  Please wear clean clothes to the hospital/surgery center.     Please read over the following fact sheets that you were given. Pain Booklet, Coughing and Deep  Breathing and Surgical Site Infection Prevention

## 2015-03-19 NOTE — Progress Notes (Signed)
PCP is Dr.[place] Sees Beth Palau FNP for her DM Pt states she sees Dr. Allyson Sabal, but it has been 2 years since she has been to see him. Reports her fasting CBG's run in the low 100's today is was 265, but she states she did not take her medications  today and she just ate.

## 2015-03-20 LAB — HEMOGLOBIN A1C
HEMOGLOBIN A1C: 7.4 % — AB (ref 4.8–5.6)
Mean Plasma Glucose: 166 mg/dL

## 2015-03-20 NOTE — Progress Notes (Signed)
Anesthesia Chart Review:  Pt is 34 year old female scheduled for L AV fistula vs graft insertion on 03/27/2015 with Dr. Edilia Bo.   PMH includes: HTN, DM, CKD (not yet on dialysis), astham, COPD, anemia, hyperlipidemia. Never smoker. BMI 51.  Medications include: albuterol, lipitor, pulmicort, carvedilol, lasix, insulin, isordil  Preoperative labs reviewed.  H/H 9.4/29.1. Cr 5.05. Glucose 270, but pt reports she did not take her meds that day. HgbA1c 7.4.   EKG 03/19/2015: NSR. Possible Left atrial enlargement. ST & T wave abnormality, consider inferolateral ischemia.Poor anterior R wave progression. Prolonged QT  Cardiac cath 09/20/2013: 1. Left main; normal  2. LAD; minor regularities 3. Left circumflex; nondominant with minor irregularities.  4. Right coronary artery; dominant with minor irregularities 5. 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 I believe her Myoview was false positive probably related to breast attenuation artifact. I do not know the etiology of her LV dysfunction but I suspect is related to her diabetes and/or idiopathic. Continue medical therapy will be recommended. Given the degree of LV dysfunction she may be a candidate for a "LifeVest " as a bridge to potential ICD therapy for primary prevention  Echo 09/16/2013: - Left ventricle: Akinesis base/mid inferior segments. Other walls hypo. Apex moves the best. The cavity size was mildly to moderately dilated. Wall thickness was increased in a pattern of mild LVH. Systolic function was moderately reduced. The estimated ejection fraction was in the range of 35% to 40%. - Left atrium: The atrium was mildly dilated. - Right ventricle: The cavity size was normal. Systolic function was normal. - Pericardium, extracardiac: A small pericardial effusion was identified posterior to the heart.  Pt does not  see cardiologist and does not appear to have had follow up since 08/2013 hospitalization.  Discussed case with Dr. Katrinka Blazing.   If no changes, I anticipate pt can proceed with surgery as scheduled.   Rica Mast, FNP-BC [facility] Stay [facility] Center/Anesthesiology Phone: [phone] 03/20/2015 11:35 AM

## 2015-03-26 MED ORDER — DEXTROSE 5 % IV SOLN
1.5000 g | INTRAVENOUS | Status: AC
  Administered 2015-03-27: 1.5 g via INTRAVENOUS
  Filled 2015-03-26: qty 1.5

## 2015-03-26 MED ORDER — SODIUM CHLORIDE 0.9 % IV SOLN: INTRAVENOUS | Status: DC

## 2015-03-27 ENCOUNTER — Ambulatory Visit (HOSPITAL_COMMUNITY): Payer: Medicaid Other | Admitting: Certified Registered"

## 2015-03-27 ENCOUNTER — Other Ambulatory Visit: Payer: Self-pay | Admitting: *Deleted

## 2015-03-27 ENCOUNTER — Ambulatory Visit (HOSPITAL_COMMUNITY): Payer: Medicaid Other | Admitting: Emergency Medicine

## 2015-03-27 ENCOUNTER — Ambulatory Visit (HOSPITAL_COMMUNITY)
Admission: RE | Admit: 2015-03-27 | Discharge: 2015-03-27 | Disposition: A | Discharge status: Home / Self Care | Payer: Medicaid Other | Source: Ambulatory Visit | Attending: Vascular Surgery | Admitting: Vascular Surgery

## 2015-03-27 ENCOUNTER — Encounter (HOSPITAL_COMMUNITY)
Admission: RE | Disposition: A | Discharge status: Home / Self Care | Payer: Self-pay | Source: Ambulatory Visit | Attending: Vascular Surgery

## 2015-03-27 ENCOUNTER — Encounter (HOSPITAL_COMMUNITY): Payer: Self-pay | Admitting: *Deleted

## 2015-03-27 DIAGNOSIS — Z882 Allergy status to sulfonamides status: Secondary | ICD-10-CM | POA: Insufficient documentation

## 2015-03-27 DIAGNOSIS — E785 Hyperlipidemia, unspecified: Secondary | ICD-10-CM | POA: Diagnosis not present

## 2015-03-27 DIAGNOSIS — Z4931 Encounter for adequacy testing for hemodialysis: Secondary | ICD-10-CM

## 2015-03-27 DIAGNOSIS — Z992 Dependence on renal dialysis: Secondary | ICD-10-CM | POA: Diagnosis not present

## 2015-03-27 DIAGNOSIS — E119 Type 2 diabetes mellitus without complications: Secondary | ICD-10-CM | POA: Insufficient documentation

## 2015-03-27 DIAGNOSIS — N184 Chronic kidney disease, stage 4 (severe): Secondary | ICD-10-CM | POA: Insufficient documentation

## 2015-03-27 DIAGNOSIS — N185 Chronic kidney disease, stage 5: Secondary | ICD-10-CM | POA: Diagnosis not present

## 2015-03-27 DIAGNOSIS — D649 Anemia, unspecified: Secondary | ICD-10-CM | POA: Insufficient documentation

## 2015-03-27 DIAGNOSIS — Z886 Allergy status to analgesic agent status: Secondary | ICD-10-CM | POA: Insufficient documentation

## 2015-03-27 DIAGNOSIS — G43909 Migraine, unspecified, not intractable, without status migrainosus: Secondary | ICD-10-CM | POA: Insufficient documentation

## 2015-03-27 DIAGNOSIS — M19042 Primary osteoarthritis, left hand: Secondary | ICD-10-CM | POA: Diagnosis not present

## 2015-03-27 DIAGNOSIS — I129 Hypertensive chronic kidney disease with stage 1 through stage 4 chronic kidney disease, or unspecified chronic kidney disease: Secondary | ICD-10-CM | POA: Insufficient documentation

## 2015-03-27 DIAGNOSIS — J45909 Unspecified asthma, uncomplicated: Secondary | ICD-10-CM | POA: Diagnosis not present

## 2015-03-27 DIAGNOSIS — N186 End stage renal disease: Secondary | ICD-10-CM

## 2015-03-27 HISTORY — PX: AV FISTULA PLACEMENT: SHX1204

## 2015-03-27 LAB — POCT I-STAT 4, (NA,K, GLUC, HGB,HCT)
GLUCOSE: 193 mg/dL — AB (ref 65–99)
HCT: 33 % — ABNORMAL LOW (ref 36.0–46.0)
Hemoglobin: 11.2 g/dL — ABNORMAL LOW (ref 12.0–15.0)
Potassium: 4.1 mmol/L (ref 3.5–5.1)
SODIUM: 135 mmol/L (ref 135–145)

## 2015-03-27 LAB — GLUCOSE, CAPILLARY: GLUCOSE-CAPILLARY: 205 mg/dL — AB (ref 65–99)

## 2015-03-27 SURGERY — ARTERIOVENOUS (AV) FISTULA CREATION
Anesthesia: Monitor Anesthesia Care | Site: Arm Lower | Laterality: Left

## 2015-03-27 MED ORDER — MIDAZOLAM HCL 5 MG/5ML IJ SOLN
INTRAMUSCULAR | Status: DC | PRN
  Administered 2015-03-27: 2 mg via INTRAVENOUS

## 2015-03-27 MED ORDER — HEPARIN SODIUM (PORCINE) 1000 UNIT/ML IJ SOLN
INTRAMUSCULAR | Status: AC
  Filled 2015-03-27: qty 1

## 2015-03-27 MED ORDER — LIDOCAINE HCL (CARDIAC) 20 MG/ML IV SOLN
INTRAVENOUS | Status: AC
  Filled 2015-03-27: qty 5

## 2015-03-27 MED ORDER — ONDANSETRON HCL 4 MG/2ML IJ SOLN
INTRAMUSCULAR | Status: AC
  Filled 2015-03-27: qty 2

## 2015-03-27 MED ORDER — HEPARIN SODIUM (PORCINE) 1000 UNIT/ML IJ SOLN
INTRAMUSCULAR | Status: DC | PRN
  Administered 2015-03-27: 10000 [IU] via INTRAVENOUS

## 2015-03-27 MED ORDER — CHLORHEXIDINE GLUCONATE CLOTH 2 % EX PADS: 6.0000 | MEDICATED_PAD | Freq: Once | CUTANEOUS | Status: DC

## 2015-03-27 MED ORDER — LIDOCAINE HCL (PF) 1 % IJ SOLN
INTRAMUSCULAR | Status: AC
  Filled 2015-03-27: qty 30

## 2015-03-27 MED ORDER — PROPOFOL INFUSION 10 MG/ML OPTIME
INTRAVENOUS | Status: DC | PRN
  Administered 2015-03-27: 120 ug/kg/min via INTRAVENOUS

## 2015-03-27 MED ORDER — LIDOCAINE HCL (PF) 1 % IJ SOLN
INTRAMUSCULAR | Status: DC | PRN
  Administered 2015-03-27: 23 mL

## 2015-03-27 MED ORDER — OXYCODONE HCL 5 MG PO TABS: 5.0000 mg | ORAL_TABLET | Freq: Four times a day (QID) | ORAL | Status: DC | PRN

## 2015-03-27 MED ORDER — GLYCOPYRROLATE 0.2 MG/ML IJ SOLN
INTRAMUSCULAR | Status: AC
  Filled 2015-03-27: qty 2

## 2015-03-27 MED ORDER — SODIUM CHLORIDE 0.9 % IR SOLN
Status: DC | PRN
  Administered 2015-03-27: 11:00:00

## 2015-03-27 MED ORDER — LIDOCAINE-EPINEPHRINE (PF) 1 %-1:200000 IJ SOLN
INTRAMUSCULAR | Status: AC
  Filled 2015-03-27: qty 10

## 2015-03-27 MED ORDER — PROTAMINE SULFATE 10 MG/ML IV SOLN
INTRAVENOUS | Status: DC | PRN
  Administered 2015-03-27: 60 mg via INTRAVENOUS

## 2015-03-27 MED ORDER — FENTANYL CITRATE (PF) 100 MCG/2ML IJ SOLN
INTRAMUSCULAR | Status: DC | PRN
  Administered 2015-03-27: 50 ug via INTRAVENOUS
  Administered 2015-03-27: 100 ug via INTRAVENOUS

## 2015-03-27 MED ORDER — FENTANYL CITRATE (PF) 100 MCG/2ML IJ SOLN
INTRAMUSCULAR | Status: AC
  Administered 2015-03-27: 50 ug via INTRAVENOUS
  Filled 2015-03-27: qty 2

## 2015-03-27 MED ORDER — NEOSTIGMINE METHYLSULFATE 10 MG/10ML IV SOLN
INTRAVENOUS | Status: AC
  Filled 2015-03-27: qty 1

## 2015-03-27 MED ORDER — LIDOCAINE HCL (CARDIAC) 20 MG/ML IV SOLN
INTRAVENOUS | Status: DC | PRN
  Administered 2015-03-27: 100 mg via INTRAVENOUS

## 2015-03-27 MED ORDER — PROPOFOL 10 MG/ML IV BOLUS
INTRAVENOUS | Status: AC
  Filled 2015-03-27: qty 20

## 2015-03-27 MED ORDER — SODIUM CHLORIDE 0.9 % IV SOLN
INTRAVENOUS | Status: DC
  Administered 2015-03-27: 09:00:00 via INTRAVENOUS

## 2015-03-27 MED ORDER — FENTANYL CITRATE (PF) 250 MCG/5ML IJ SOLN
INTRAMUSCULAR | Status: AC
  Filled 2015-03-27: qty 5

## 2015-03-27 MED ORDER — OXYCODONE HCL 5 MG PO TABS: 5.0000 mg | ORAL_TABLET | Freq: Once | ORAL | Status: DC | PRN

## 2015-03-27 MED ORDER — PROTAMINE SULFATE 10 MG/ML IV SOLN
INTRAVENOUS | Status: AC
  Filled 2015-03-27: qty 5

## 2015-03-27 MED ORDER — MIDAZOLAM HCL 2 MG/2ML IJ SOLN
INTRAMUSCULAR | Status: AC
  Filled 2015-03-27: qty 2

## 2015-03-27 MED ORDER — OXYCODONE HCL 5 MG/5ML PO SOLN: 5.0000 mg | Freq: Once | ORAL | Status: DC | PRN

## 2015-03-27 MED ORDER — 0.9 % SODIUM CHLORIDE (POUR BTL) OPTIME
TOPICAL | Status: DC | PRN
  Administered 2015-03-27: 1000 mL

## 2015-03-27 MED ORDER — FENTANYL CITRATE (PF) 100 MCG/2ML IJ SOLN
25.0000 ug | INTRAMUSCULAR | Status: DC | PRN
  Administered 2015-03-27 (×2): 50 ug via INTRAVENOUS

## 2015-03-27 SURGICAL SUPPLY — 34 items
ARMBAND PINK RESTRICT EXTREMIT (MISCELLANEOUS) ×3 IMPLANT
CANISTER SUCTION 2500CC (MISCELLANEOUS) ×3 IMPLANT
CANNULA VESSEL 3MM 2 BLNT TIP (CANNULA) ×3 IMPLANT
CLIP TI MEDIUM 6 (CLIP) ×3 IMPLANT
CLIP TI WIDE RED SMALL 6 (CLIP) ×3 IMPLANT
COVER PROBE W GEL 5X96 (DRAPES) IMPLANT
DECANTER SPIKE VIAL GLASS SM (MISCELLANEOUS) IMPLANT
ELECT REM PT RETURN 9FT ADLT (ELECTROSURGICAL) ×3
ELECTRODE REM PT RTRN 9FT ADLT (ELECTROSURGICAL) ×1 IMPLANT
GLOVE BIO SURGEON STRL SZ 6.5 (GLOVE) ×2 IMPLANT
GLOVE BIO SURGEON STRL SZ7.5 (GLOVE) ×3 IMPLANT
GLOVE BIO SURGEONS STRL SZ 6.5 (GLOVE) ×1
GLOVE BIOGEL PI IND STRL 6.5 (GLOVE) ×3 IMPLANT
GLOVE BIOGEL PI IND STRL 8 (GLOVE) ×2 IMPLANT
GLOVE BIOGEL PI INDICATOR 6.5 (GLOVE) ×6
GLOVE BIOGEL PI INDICATOR 8 (GLOVE) ×4
GOWN STRL REUS W/ TWL LRG LVL3 (GOWN DISPOSABLE) ×3 IMPLANT
GOWN STRL REUS W/ TWL XL LVL3 (GOWN DISPOSABLE) ×1 IMPLANT
GOWN STRL REUS W/TWL LRG LVL3 (GOWN DISPOSABLE) ×6
GOWN STRL REUS W/TWL XL LVL3 (GOWN DISPOSABLE) ×2
KIT BASIN OR (CUSTOM PROCEDURE TRAY) ×3 IMPLANT
KIT ROOM TURNOVER OR (KITS) ×3 IMPLANT
LIQUID BAND (GAUZE/BANDAGES/DRESSINGS) ×6 IMPLANT
MARKER SKIN DUAL TIP RULER LAB (MISCELLANEOUS) ×3 IMPLANT
NS IRRIG 1000ML POUR BTL (IV SOLUTION) ×3 IMPLANT
PACK CV ACCESS (CUSTOM PROCEDURE TRAY) ×3 IMPLANT
PAD ARMBOARD 7.5X6 YLW CONV (MISCELLANEOUS) ×6 IMPLANT
SPONGE SURGIFOAM ABS GEL 100 (HEMOSTASIS) IMPLANT
SUT PROLENE 6 0 BV (SUTURE) ×3 IMPLANT
SUT VIC AB 3-0 SH 27 (SUTURE) ×2
SUT VIC AB 3-0 SH 27X BRD (SUTURE) ×1 IMPLANT
SUT VICRYL 4-0 PS2 18IN ABS (SUTURE) ×3 IMPLANT
UNDERPAD 30X30 INCONTINENT (UNDERPADS AND DIAPERS) ×3 IMPLANT
WATER STERILE IRR 1000ML POUR (IV SOLUTION) IMPLANT

## 2015-03-27 NOTE — Op Note (Signed)
    NAME: [name]    MRN: [id number] DOB: 1981/02/17    DATE OF OPERATION: 03/27/2015  PREOP DIAGNOSIS: Stage 4 CKD  POSTOP DIAGNOSIS: Same   PROCEDURE: left radiocephalic AV fistula  SURGEON: Di Kindle. Edilia Bo, MD, FACS  ASSIST: Karsten Ro, [facility]  ANESTHESIA: local with sedation   EBL: minimal  INDICATIONS: [name] is a 34 y.o. female who is not yet on dialysis. She presents for new access.  FINDINGS: 2.5 mm radial artery. 3.5 mm cephalic vein  TECHNIQUE: The patient was taken to the operative and sedated by anesthesia. The left upper extremity was prepped and draped in usual sterile fashion. The vein was fairly far lateral and therefore elected to make a separate incision over the vein and a separate incision over the radial artery. After the skin was anesthetized 1% lidocaine, an incision was made over the cephalic vein. This was dissected free and ligated distally and irrigated up with heparinized saline. It was a 3-3.5 mm vein. After the skin was anesthetized, separate longitudinal incision was made over the radial artery. This was dissected free beneath the fascia. It was a 2.5 mm artery. A tunnel was created between the 2 incisions and the patient was heparinized. The radial artery was clamped proximally and distally and longitudinal arteriotomy was made. The vein was sewn end-to-side to the artery using continuous 60 proline suture. The patient was a good thrill in the fistula and good signal with the Doppler. The incision laterally was closed with 4-0 subcuticular stitch. The incision over the radial artery was closed with a deep layer of 3-0 Vicryl and the skin closed with 4-0 Vicryl. Liquiband was applied. The patient tolerated the procedure well and transferred to the recovery room in stable condition. All needle and sponge counts were correct.  Waverly Ferrari, MD, FACS [facility]  DATE OF DICTATION:   03/27/2015

## 2015-03-27 NOTE — Interval H&P Note (Signed)
History and Physical Interval Note:  03/27/2015 10:08 AM  [name]  has presented today for surgery, with the diagnosis of Stage IV Chronic Kidney Disease N18.4  The various methods of treatment have been discussed with the patient and family. After consideration of risks, benefits and other options for treatment, the patient has consented to  Procedure(s): ARTERIOVENOUS (AV) FISTULA CREATION VERSUS GRAFT INSERTION (Left) as a surgical intervention .  The patient's history has been reviewed, patient examined, no change in status, stable for surgery.  I have reviewed the patient's chart and labs.  Questions were answered to the patient's satisfaction.     Waverly Ferrari

## 2015-03-27 NOTE — Anesthesia Preprocedure Evaluation (Signed)
Anesthesia Evaluation  Patient identified by MRN, date of birth, ID band Patient awake    Reviewed: Allergy & Precautions, NPO status , Patient's Chart, lab work & pertinent test results  History of Anesthesia Complications Negative for: history of anesthetic complications  Airway Mallampati: III  TM Distance: >3 FB Neck ROM: Full    Dental  (+) Teeth Intact   Pulmonary shortness of breath, asthma , neg sleep apnea, neg recent URI, neg PE breath sounds clear to auscultation        Cardiovascular hypertension, Pt. on medications - angina- Past MI and - CHF - dysrhythmias Rhythm:Regular     Neuro/Psych  Headaches, negative psych ROS   GI/Hepatic negative GI ROS, Neg liver ROS,   Endo/Other  diabetes, Type 2, Insulin DependentMorbid obesity  Renal/GU CRFRenal disease     Musculoskeletal  (+) Arthritis -,   Abdominal   Peds  Hematology  (+) anemia ,   Anesthesia Other Findings   Reproductive/Obstetrics                             Anesthesia Physical Anesthesia Plan  ASA: III  Anesthesia Plan: MAC   Post-op Pain Management:    Induction: Intravenous  Airway Management Planned: Nasal Cannula  Additional Equipment: None  Intra-op Plan:   Post-operative Plan: Extubation in OR  Informed Consent: I have reviewed the patients History and Physical, chart, labs and discussed the procedure including the risks, benefits and alternatives for the proposed anesthesia with the patient or authorized representative who has indicated his/her understanding and acceptance.   Dental advisory given  Plan Discussed with: CRNA and Surgeon  Anesthesia Plan Comments:         Anesthesia Quick Evaluation

## 2015-03-27 NOTE — Anesthesia Postprocedure Evaluation (Signed)
  Anesthesia Post-op Note  Patient: [name]  Procedure(s) Performed: Procedure(s): CREATION RADIAL CEPHALIC ARTERIOVENOUS FISTULA (Left)  Patient Location: PACU  Anesthesia Type:MAC  Level of Consciousness: awake  Airway and Oxygen Therapy: Patient Spontanous Breathing  Post-op Pain: mild  Post-op Assessment: Post-op Vital signs reviewed, Patient's Cardiovascular Status Stable, Respiratory Function Stable, Patent Airway, No signs of Nausea or vomiting and Pain level controlled LLE Motor Response: Purposeful movement, Responds to commands LLE Sensation: Full sensation          Post-op Vital Signs: Reviewed and stable  Last Vitals:  Filed Vitals:   03/27/15 1251  BP: 138/72  Pulse:   Temp:   Resp:     Complications: No apparent anesthesia complications

## 2015-03-27 NOTE — Transfer of Care (Signed)
Immediate Anesthesia Transfer of Care Note  Patient: [name]  Procedure(s) Performed: Procedure(s): CREATION RADIAL CEPHALIC ARTERIOVENOUS FISTULA (Left)  Patient Location: PACU  Anesthesia Type:MAC  Level of Consciousness: awake, alert , oriented and patient cooperative  Airway & Oxygen Therapy: Patient Spontanous Breathing and Patient connected to face mask oxygen  Post-op Assessment: Report given to RN, Post -op Vital signs reviewed and stable and Patient moving all extremities  Post vital signs: Reviewed and stable  Last Vitals:  Filed Vitals:   03/27/15 1204  BP:   Pulse:   Temp: 36.8 C  Resp:     Complications: No apparent anesthesia complications

## 2015-03-27 NOTE — H&P (View-Only) (Signed)
[facility] and [facility]in Specialist of [facility]  Patient name: [name] MRN: [id number] DOB: 1981/03/05 Sex: female  REASON FOR CONSULT: Evaluate for hemodialysis access. Referred by Dr. Kathrene Bongo.   HPI: [name] is a 34 y.o. female who is not yet on dialysis. She was referred for evaluation for hemodialysis access. She is right-handed.  I have reviewed her records that were sent from [facility]. She does have a history of hypertension, hyperlipidemia, anemia of chronic disease, diabetes, and chronic kidney disease. BMI is 51. GFR on 01/22/2015 was 12.  She denies any recent uremic symptoms. Specifically she denies nausea, vomiting, fatigue, anorexia, or palpitations.   Past Medical History  Diagnosis Date  . Hypertension   . Asthma   . Hyperlipidemia   . Pneumonia     "couple times; have it now" (09/15/2013)  . Chronic bronchitis     "just about q yr" (09/15/2013)  . Shortness of breath     "just recently; related to the pneumonia" (09/15/2013)  . Type II diabetes mellitus   . Anemia   . Migraine     "get them alot" (09/15/2013)  . Arthritis     "left hand" (09/15/2013)  . Chronic kidney disease     "low kidney function" (09/15/2013)   Family History  Problem Relation Age of Onset  . Diabetes Mother   . Stroke Mother   . Diabetes Maternal Grandmother   . Cancer Paternal Grandmother   . Hypertension Father   . Hyperlipidemia Father    SOCIAL HISTORY: History  Substance Use Topics  . Smoking status: Never Smoker   . Smokeless tobacco: Never Used  . Alcohol Use: No   Allergies  Allergen Reactions  . Aspirin Anaphylaxis  . Sulfur Hives  . Tramadol Other (See Comments)    Pt states she feels weird    Current Outpatient Prescriptions  Medication Sig Dispense Refill  . albuterol (PROVENTIL HFA;VENTOLIN HFA) 108 (90 BASE) MCG/ACT inhaler Inhale into the lungs every 6 (six) hours as needed for wheezing or shortness of breath.    Marland Kitchen albuterol  (PROVENTIL) (2.5 MG/3ML) 0.083% nebulizer solution Take 2.5 mg by nebulization every 6 (six) hours as needed for wheezing or shortness of breath.    Marland Kitchen atorvastatin (LIPITOR) 40 MG tablet Take 1 tablet (40 mg total) by mouth daily. 30 tablet 3  . budesonide (PULMICORT) 0.25 MG/2ML nebulizer solution Take 2 mLs (0.25 mg total) by nebulization 2 (two) times daily. 60 mL 12  . carvedilol (COREG) 25 MG tablet 1 tablet PO BID. (Patient taking differently: Take 25 mg by mouth 2 (two) times daily with a meal. 1 tablet PO BID.) 60 tablet 0  . doxycycline (VIBRA-TABS) 100 MG tablet Take 1 tablet (100 mg total) by mouth 2 (two) times daily. 19 tablet 0  . furosemide (LASIX) 80 MG tablet Take 160 mg by mouth 2 (two) times daily. 1 1/2 tablet 2 x daily    . hydrALAZINE (APRESOLINE) 25 MG tablet Take 1.5 tablets (37.5 mg total) by mouth every 8 (eight) hours. 120 tablet 0  . HYDROcodone-acetaminophen (NORCO/VICODIN) 5-325 MG per tablet 1 to 2 tabs every 4 to 6 hours as needed for pain. 20 tablet 0  . insulin aspart (NOVOLOG PENFILL) cartridge Inject 10 Units into the skin 3 (three) times daily with meals. (Patient taking differently: Inject 15 Units into the skin 3 (three) times daily with meals. ) 15 mL 11  . insulin glargine (LANTUS) 100 UNIT/ML injection Inject 0.2 mLs (  20 Units total) into the skin at bedtime. 10 mL 12  . isosorbide dinitrate (ISORDIL) 20 MG tablet TAKE 1 TABLET BY MOUTH THREE TIMES DAILY 270 tablet 0   No current facility-administered medications for this visit.   REVIEW OF SYSTEMS: Arly.Keller ] denotes positive finding; [  ] denotes negative finding  CARDIOVASCULAR:  [ ]  chest pain   [ ]  chest pressure   [ ]  palpitations   [ ]  orthopnea   [ ]  dyspnea on exertion   [ ]  claudication   [ ]  rest pain   [ ]  DVT   [ ]  phlebitis PULMONARY:   [ ]  productive cough   Arly.Keller ] asthma   [ ]  wheezing NEUROLOGIC:   [ ]  weakness  [ ]  paresthesias  [ ]  aphasia  [ ]  amaurosis  [ ]  dizziness HEMATOLOGIC:   [ ]   bleeding problems   [ ]  clotting disorders MUSCULOSKELETAL:  [ ]  joint pain   [ ]  joint swelling [ ]  leg swelling GASTROINTESTINAL: [ ]   blood in stool  [ ]   hematemesis GENITOURINARY:  [ ]   dysuria  [ ]   hematuria PSYCHIATRIC:  [ ]  history of major depression INTEGUMENTARY:  [ ]  rashes  [ ]  ulcers CONSTITUTIONAL:  [ ]  fever   [ ]  chills  PHYSICAL EXAM: There were no vitals filed for this visit. GENERAL: The patient is a well-nourished female, in no acute distress. The vital signs are documented above. CARDIOVASCULAR: There is a regular rate and rhythm. Do not detect carotid bruits. She has palpable radial pulses bilaterally. PULMONARY: There is good air exchange bilaterally without wheezing or rales. ABDOMEN: Soft and non-tender with normal pitched bowel sounds.  MUSCULOSKELETAL: There are no major deformities or cyanosis. NEUROLOGIC: No focal weakness or paresthesias are detected. SKIN: There are no ulcers or rashes noted. PSYCHIATRIC: The patient has a normal affect.  DATA:  I have independently interpreted her arterial Doppler study today which shows triphasic Doppler signals in the radial and ulnar positions bilaterally.  I have independently interpreted her upper extremity vein mapping which shows that the forearm cephalic vein and basilic vein look reasonable in size all of the basilic vein is fairly short. In the left arm the forearm cephalic vein and upper arm basilic vein look reasonable in size. The upper arm cephalic vein on the left is not visualized.  Her GFR in December was 16.  MEDICAL ISSUES:  STAGE IV CHRONIC KIDNEY DISEASE: She appears to be a reasonable candidate for a left radiocephalic AV fistula or left basilic vein transposition. If neither are adequate she would require placement of an AV graft. I have explained the indications for placement of an AV fistula or AV graft. I've explained that if at all possible we will place an AV fistula.  I have reviewed the risks  of placement of an AV fistula including but not limited to: failure of the fistula to mature, need for subsequent interventions, and thrombosis. In addition I have reviewed the potential complications of placement of an AV graft. These risks include, but are not limited to, graft thrombosis, graft infection, wound healing problems, bleeding, arm swelling, and steal syndrome. All the patient's questions were answered and they are agreeable to proceed with surgery. Her surgery is scheduled for 03/27/2015.   Waverly Ferrari [facility] [place]: [phone]

## 2015-03-28 ENCOUNTER — Encounter (HOSPITAL_COMMUNITY): Payer: Self-pay | Admitting: Vascular Surgery

## 2015-03-29 ENCOUNTER — Telehealth: Payer: Self-pay | Admitting: Vascular Surgery

## 2015-03-29 NOTE — Telephone Encounter (Signed)
Spoke with pt to schedule appt, dpm °

## 2015-03-29 NOTE — Telephone Encounter (Signed)
-----   Message from Sharee Pimple, RN sent at 03/27/2015 12:53 PM EDT ----- Regarding: Schedule   ----- Message -----    From: Raymond Gurney, PA-C    Sent: 03/27/2015  11:49 AM      To: Vvs Charge Pool  S/p left radiocephalic AVF 03/27/15  F/u with Dr. Edilia Bo in 6 weeks with duplex.   Thanks Selena Batten

## 2015-04-05 ENCOUNTER — Encounter (HOSPITAL_COMMUNITY)
Admission: RE | Admit: 2015-04-05 | Discharge: 2015-04-05 | Disposition: A | Discharge status: Home / Self Care | Payer: Medicaid Other | Source: Ambulatory Visit | Attending: Nephrology | Admitting: Nephrology

## 2015-04-05 DIAGNOSIS — Z79899 Other long term (current) drug therapy: Secondary | ICD-10-CM | POA: Insufficient documentation

## 2015-04-05 DIAGNOSIS — D509 Iron deficiency anemia, unspecified: Secondary | ICD-10-CM | POA: Diagnosis not present

## 2015-04-05 DIAGNOSIS — N183 Chronic kidney disease, stage 3 (moderate): Secondary | ICD-10-CM | POA: Insufficient documentation

## 2015-04-05 DIAGNOSIS — Z5181 Encounter for therapeutic drug level monitoring: Secondary | ICD-10-CM | POA: Insufficient documentation

## 2015-04-05 DIAGNOSIS — D631 Anemia in chronic kidney disease: Secondary | ICD-10-CM | POA: Diagnosis not present

## 2015-04-05 LAB — RENAL FUNCTION PANEL
Albumin: 3 g/dL — ABNORMAL LOW (ref 3.5–5.0)
Anion gap: 11 (ref 5–15)
BUN: 54 mg/dL — ABNORMAL HIGH (ref 6–20)
CHLORIDE: 103 mmol/L (ref 101–111)
CO2: 21 mmol/L — AB (ref 22–32)
CREATININE: 5.44 mg/dL — AB (ref 0.44–1.00)
Calcium: 8.1 mg/dL — ABNORMAL LOW (ref 8.9–10.3)
GFR calc Af Amer: 11 mL/min — ABNORMAL LOW (ref >60)
GFR calc non Af Amer: 9 mL/min — ABNORMAL LOW (ref >60)
GLUCOSE: 212 mg/dL — AB (ref 65–99)
POTASSIUM: 4.4 mmol/L (ref 3.5–5.1)
Phosphorus: 5.9 mg/dL — ABNORMAL HIGH (ref 2.5–4.6)
Sodium: 135 mmol/L (ref 135–145)

## 2015-04-05 LAB — IRON AND TIBC
IRON: 67 ug/dL (ref 28–170)
SATURATION RATIOS: 30 % (ref 10.4–31.8)
TIBC: 227 ug/dL — ABNORMAL LOW (ref 250–450)
UIBC: 160 ug/dL

## 2015-04-05 LAB — POCT HEMOGLOBIN-HEMACUE: Hemoglobin: 10.6 g/dL — ABNORMAL LOW (ref 12.0–15.0)

## 2015-04-05 LAB — FERRITIN: Ferritin: 313 ng/mL — ABNORMAL HIGH (ref 11–307)

## 2015-04-05 MED ORDER — EPOETIN ALFA 20000 UNIT/ML IJ SOLN
20000.0000 [IU] | INTRAMUSCULAR | Status: DC
  Administered 2015-04-05: 20000 [IU] via SUBCUTANEOUS

## 2015-04-05 MED ORDER — EPOETIN ALFA 20000 UNIT/ML IJ SOLN
INTRAMUSCULAR | Status: AC
  Filled 2015-04-05: qty 1

## 2015-04-06 LAB — PTH, INTACT AND CALCIUM
Calcium, Total (PTH): 7.9 mg/dL — ABNORMAL LOW (ref 8.7–10.2)
PTH: 283 pg/mL — ABNORMAL HIGH (ref 15–65)

## 2015-04-19 ENCOUNTER — Encounter (HOSPITAL_COMMUNITY)
Admission: RE | Admit: 2015-04-19 | Discharge: 2015-04-19 | Disposition: A | Discharge status: Home / Self Care | Payer: Medicaid Other | Source: Ambulatory Visit | Attending: Nephrology | Admitting: Nephrology

## 2015-04-19 DIAGNOSIS — N183 Chronic kidney disease, stage 3 (moderate): Secondary | ICD-10-CM | POA: Diagnosis not present

## 2015-04-19 LAB — POCT HEMOGLOBIN-HEMACUE: Hemoglobin: 10.1 g/dL — ABNORMAL LOW (ref 12.0–15.0)

## 2015-04-19 MED ORDER — EPOETIN ALFA 20000 UNIT/ML IJ SOLN
20000.0000 [IU] | INTRAMUSCULAR | Status: DC
  Administered 2015-04-19: 20000 [IU] via SUBCUTANEOUS

## 2015-04-19 MED ORDER — EPOETIN ALFA 20000 UNIT/ML IJ SOLN
INTRAMUSCULAR | Status: AC
  Filled 2015-04-19: qty 1

## 2015-04-27 ENCOUNTER — Other Ambulatory Visit: Payer: Self-pay | Admitting: Orthopedic Surgery

## 2015-04-30 ENCOUNTER — Encounter (HOSPITAL_COMMUNITY): Payer: Self-pay | Admitting: Vascular Surgery

## 2015-04-30 DIAGNOSIS — I219 Acute myocardial infarction, unspecified: Secondary | ICD-10-CM

## 2015-04-30 HISTORY — DX: Acute myocardial infarction, unspecified: I21.9

## 2015-04-30 MED ORDER — CEFAZOLIN SODIUM 10 G IJ SOLR
3.0000 g | INTRAMUSCULAR | Status: DC
  Filled 2015-04-30: qty 3000

## 2015-04-30 NOTE — Progress Notes (Signed)
Anesthesia Chart Review: SAME DAY WORK-UP  Pt is 34 year old female scheduled for right shoulder arthroscopy biceps tenotomy, debridement labral tear tomorrow by Dr. Ave Filter.  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 Never smoker. Last BMI 51, consistent with morbid obesity. PCP is listed as Beth Palau, FNP. Nephrologist is with [facility]. Was seen by cardiologist Dr. Nanetta Batty in 08/2013 with plans for out-patient echo follow-up, but I don't see that she has had cardiology follow-up since.  Medications include: albuterol, lipitor, pulmicort, carvedilol, lasix, insulin (Novolog, Lantus), isordil, oxycodone  EKG 03/19/2015: NSR. Possible Left atrial enlargement. ST & T wave abnormality, consider inferolateral ischemia.Poor anterior R wave progression. Prolonged QT. High lateral T wave abnormality is more prominent when compared to EKG from 09/18/13 stress test (see Muse).   Cardiac cath 09/20/2013: 1. Left main; normal  2. LAD; minor regularities 3. Left circumflex; nondominant with minor irregularities.  4. Right coronary artery; dominant with minor irregularities 5. Left ventriculography; not performed today to conserve contrast IMPRESSION: Ms. [name] has noncritical CAD with moderate minor irregularities in all 3 coronary arteries. I believe her Myoview was false positive probably related to breast attenuation artifact. I do not know the etiology of her LV dysfunction but I suspect is related to her diabetes and/or idiopathic. Continue medical therapy will be recommended. Given the degree of LV dysfunction she may be a candidate for a "LifeVest " as a bridge to potential ICD therapy for primary prevention (Dr. Nanetta Batty).  09/19/13 Nuclear stress test:  IMPRESSION: 1. Anterior  wall ischemia. 2. Left ventricular dilatation with global hypokinesis. 3. Left ventricular ejection fraction 31%.  Echo 09/16/2013: - Left ventricle: Akinesis base/mid inferior segments. Other walls hypo. Apex moves the best. The cavity size was mildly to moderately dilated. Wall thickness was increased in a pattern of mild LVH. Systolic function was moderately reduced. The estimated ejection fraction was in the range of 35% to 40%. - Left atrium: The atrium was mildly dilated. - Right ventricle: The cavity size was normal. Systolic function was normal. - Pericardium, extracardiac: A small pericardial effusion was identified posterior to the heart.  She is scheduled for same day labs.  Reviewed above with anesthesiologist Dr. Michelle Piper. Recommend pre-operative cardiology clearance since it appears she has not had cardiology follow-up for her non-ischemic CM with LVEF 35-40% since 2014. Darl Pikes at Dr. Veda Canning office notified.  Velna Ochs [facility] Short Stay Center/Anesthesiology Phone [phone] 04/30/2015 1:42 PM

## 2015-05-01 ENCOUNTER — Encounter (HOSPITAL_COMMUNITY): Admission: RE | Payer: Self-pay | Source: Ambulatory Visit

## 2015-05-01 ENCOUNTER — Ambulatory Visit (HOSPITAL_COMMUNITY): Admission: RE | Admit: 2015-05-01 | Payer: Medicaid Other | Source: Ambulatory Visit | Admitting: Orthopedic Surgery

## 2015-05-01 SURGERY — SHOULDER ARTHROSCOPY WITH BICEPS TENOTOMY
Anesthesia: Choice | Laterality: Right

## 2015-05-02 ENCOUNTER — Other Ambulatory Visit (HOSPITAL_COMMUNITY): Payer: Self-pay | Admitting: *Deleted

## 2015-05-03 ENCOUNTER — Other Ambulatory Visit: Payer: Self-pay | Admitting: Orthopedic Surgery

## 2015-05-03 ENCOUNTER — Inpatient Hospital Stay (HOSPITAL_COMMUNITY): Admission: RE | Admit: 2015-05-03 | Payer: Medicaid Other | Source: Ambulatory Visit

## 2015-05-04 ENCOUNTER — Ambulatory Visit (INDEPENDENT_AMBULATORY_CARE_PROVIDER_SITE_OTHER): Payer: Medicaid Other | Admitting: Cardiovascular Disease

## 2015-05-04 ENCOUNTER — Encounter: Payer: Self-pay | Admitting: Cardiovascular Disease

## 2015-05-04 VITALS — BP 190/100 | HR 103 | Ht 68.0 in | Wt 338.0 lb

## 2015-05-04 DIAGNOSIS — Z01818 Encounter for other preprocedural examination: Secondary | ICD-10-CM

## 2015-05-04 DIAGNOSIS — I10 Essential (primary) hypertension: Secondary | ICD-10-CM

## 2015-05-04 DIAGNOSIS — E785 Hyperlipidemia, unspecified: Secondary | ICD-10-CM

## 2015-05-04 DIAGNOSIS — I428 Other cardiomyopathies: Secondary | ICD-10-CM

## 2015-05-04 DIAGNOSIS — I429 Cardiomyopathy, unspecified: Secondary | ICD-10-CM

## 2015-05-04 DIAGNOSIS — I519 Heart disease, unspecified: Secondary | ICD-10-CM

## 2015-05-04 NOTE — Assessment & Plan Note (Signed)
History of hypertension with blood pressure measured today at 190/100. She is on carvedilol, hydralazine and isosorbide which she did not take today.

## 2015-05-04 NOTE — Assessment & Plan Note (Signed)
History of hyperlipidemia on atorvastatin 40 mg a day with recent lipid profile performed 10/25/13 revealed a total cholesterol 274, LDL 176 and HDL of 62. We will repeat a lipid and liver profile

## 2015-05-04 NOTE — Progress Notes (Signed)
05/04/2015 [name]   1981-07-22  [id number]  Primary Physician ANDERSON,TERESA, FNP Primary Cardiologist: Runell Gess MD Roseanne Reno   HPI:  [name] is a 34 year old severely overweight single African-American female mother of 2 children and does not work and was referred for cardiac clearance before elective right shoulder outpatient procedure to be performing Dr. Polo Riley next Thursday. She has a history of type 1 diabetes since adolescence, treated hypertension and hyperlipidemia. There is no family history. She does not smoke. She's never had a heart attack or stroke and gets occasionally short of breath. She recently had an AV fistula placed by Dr. Edilia Bo in anticipation of dialysis. Her primary care physician is Dr. Cherylann Ratel and nephrologist Dr. Lacy Duverney. She apparently had a Myoview stress test performed in December 2014 that was high risk with ischemia in the LAD territory which led to a diagnostic heart cath which I performed radially 09/20/13 revealing essentially normal coronary arteries suggesting she had a nonischemic cardio myopathy. Her creatinines were in the 3 range at that point and currently they are in the 5 range.   Current Outpatient Prescriptions  Medication Sig Dispense Refill  . albuterol (PROVENTIL HFA;VENTOLIN HFA) 108 (90 BASE) MCG/ACT inhaler Inhale 2 puffs into the lungs every 6 (six) hours as needed for wheezing or shortness of breath.     Marland Kitchen albuterol (PROVENTIL) (2.5 MG/3ML) 0.083% nebulizer solution Take 2.5 mg by nebulization every 6 (six) hours as needed for wheezing or shortness of breath.    Marland Kitchen atorvastatin (LIPITOR) 40 MG tablet Take 1 tablet (40 mg total) by mouth daily. (Patient taking differently: Take 40 mg by mouth at bedtime. ) 30 tablet 3  . budesonide (PULMICORT) 0.25 MG/2ML nebulizer solution Take 2 mLs (0.25 mg total) by nebulization 2 (two) times daily. (Patient taking differently: Take 0.25 mg by nebulization  2 (two) times daily as needed (wheezing/shortness of breath). ) 60 mL 12  . calcitRIOL (ROCALTROL) 0.25 MCG capsule Take 0.25 mcg by mouth daily.    . carvedilol (COREG) 25 MG tablet 1 tablet PO BID. (Patient taking differently: Take 25 mg by mouth 2 (two) times daily with a meal. ) 60 tablet 0  . diphenhydrAMINE (BENADRYL) 25 MG tablet Take 50 mg by mouth at bedtime as needed for allergies.    . furosemide (LASIX) 80 MG tablet Take 160 mg by mouth 2 (two) times daily.     . hydrALAZINE (APRESOLINE) 25 MG tablet Take 1.5 tablets (37.5 mg total) by mouth every 8 (eight) hours. 120 tablet 0  . insulin aspart (NOVOLOG FLEXPEN) 100 UNIT/ML FlexPen Inject 12-20 Units into the skin 3 (three) times daily with meals. Per sliding scale - based on carb count and CBG    . Insulin Glargine (LANTUS SOLOSTAR) 100 UNIT/ML Solostar Pen Inject 20 Units into the skin at bedtime.    . isosorbide dinitrate (ISORDIL) 20 MG tablet TAKE 1 TABLET BY MOUTH THREE TIMES DAILY (Patient taking differently: Take 20 mg by mouth 3 (three) times daily. ) 270 tablet 0  . oxyCODONE (ROXICODONE) 5 MG immediate release tablet Take 1 tablet (5 mg total) by mouth every 6 (six) hours as needed for severe pain. 20 tablet 0   No current facility-administered medications for this visit.    Allergies  Allergen Reactions  . Aspirin Anaphylaxis  . Sulfur Hives  . Tramadol Other (See Comments)    Pt states she feels weird     History  Social History  . Marital Status: Single    Spouse Name: N/A  . Number of Children: 2  . Years of Education: N/A   Occupational History  . Student    Social History Main Topics  . Smoking status: Never Smoker   . Smokeless tobacco: Never Used  . Alcohol Use: No  . Drug Use: No  . Sexual Activity:    Partners: Male    Birth Control/ Protection: Other-see comments     Comment: S/P tubal ligation   Other Topics Concern  . Not on file   Social History Narrative   Single.  Moved here from  [place].  Lives with 2 children.       Review of Systems: General: negative for chills, fever, night sweats or weight changes.  Cardiovascular: negative for chest pain, dyspnea on exertion, edema, orthopnea, palpitations, paroxysmal nocturnal dyspnea or shortness of breath Dermatological: negative for rash Respiratory: negative for cough or wheezing Urologic: negative for hematuria Abdominal: negative for nausea, vomiting, diarrhea, bright red blood per rectum, melena, or hematemesis Neurologic: negative for visual changes, syncope, or dizziness All other systems reviewed and are otherwise negative except as noted above.    Blood pressure 190/100, pulse 103, height  (1.727 m), weight 338 lb (153.316 kg).  General appearance: alert and no distress Neck: no adenopathy, no carotid bruit, no JVD, supple, symmetrical, trachea midline and thyroid not enlarged, symmetric, no tenderness/mass/nodules Lungs: clear to auscultation bilaterally Heart: regular rate and rhythm, S1, S2 normal, no murmur, click, rub or gallop Extremities: extremities normal, atraumatic, no cyanosis or edema  EKG sinus tachycardia 103 with evidence of LVH with repolarization changes. I personally reviewed this EKG  ASSESSMENT AND PLAN:   Non-ischemic cardiomyopathy - false positive Myoview- EF 45-40% [name] had a cardiac catheterization performed by myself radially 09/20/13 because of markedly abnormal Myoview stress test suggesting revealing ONLY minimal CAD suggesting her Myoview was false positive probably because of attenuation artifact and that her LV dysfunction was related to her diabetes and/or hypertension. She gets occasional dyspnea.  Hypertension History of hypertension with blood pressure measured today at 190/100. She is on carvedilol, hydralazine and isosorbide which she did not take today.  Hyperlipidemia History of hyperlipidemia on atorvastatin 40 mg a day with recent lipid profile performed  10/25/13 revealed a total cholesterol 274, LDL 176 and HDL of 62. We will repeat a lipid and liver profile      Runell Gess MD [facility] 05/04/2015 4:31 PM

## 2015-05-04 NOTE — Patient Instructions (Addendum)
  We will see you back in follow up in 1 year.   Dr Allyson Sabal has ordered:  Echocardiogram. Echocardiography is a painless test that uses sound waves to create images of your heart. It provides your doctor with information about the size and shape of your heart and how well your heart's chambers and valves are working. This procedure takes approximately one hour. There are no restrictions for this procedure. This test is performed at [address]- 3rd floor.

## 2015-05-04 NOTE — Assessment & Plan Note (Addendum)
[name] had a cardiac catheterization performed by myself radially 09/20/13 because of markedly abnormal Myoview stress test suggesting revealing ONLY minimal CAD suggesting her Myoview was false positive probably because of attenuation artifact and that her LV dysfunction was related to her diabetes and/or hypertension. She gets occasional dyspnea.

## 2015-05-07 ENCOUNTER — Other Ambulatory Visit (HOSPITAL_COMMUNITY): Payer: Self-pay | Admitting: *Deleted

## 2015-05-07 ENCOUNTER — Encounter (HOSPITAL_COMMUNITY): Payer: Self-pay

## 2015-05-07 ENCOUNTER — Ambulatory Visit (HOSPITAL_COMMUNITY)
Admission: RE | Admit: 2015-05-07 | Discharge: 2015-05-07 | Disposition: A | Discharge status: Home / Self Care | Payer: Medicaid Other | Source: Ambulatory Visit | Attending: Cardiovascular Disease | Admitting: Cardiovascular Disease

## 2015-05-07 ENCOUNTER — Encounter (HOSPITAL_COMMUNITY)
Admission: RE | Admit: 2015-05-07 | Discharge: 2015-05-07 | Disposition: A | Discharge status: Home / Self Care | Payer: Medicaid Other | Source: Ambulatory Visit | Attending: Orthopedic Surgery | Admitting: Orthopedic Surgery

## 2015-05-07 DIAGNOSIS — I071 Rheumatic tricuspid insufficiency: Secondary | ICD-10-CM | POA: Diagnosis not present

## 2015-05-07 DIAGNOSIS — I519 Heart disease, unspecified: Secondary | ICD-10-CM

## 2015-05-07 DIAGNOSIS — I340 Nonrheumatic mitral (valve) insufficiency: Secondary | ICD-10-CM | POA: Diagnosis not present

## 2015-05-07 DIAGNOSIS — Z01818 Encounter for other preprocedural examination: Secondary | ICD-10-CM

## 2015-05-07 DIAGNOSIS — I313 Pericardial effusion (noninflammatory): Secondary | ICD-10-CM | POA: Insufficient documentation

## 2015-05-07 DIAGNOSIS — Z01810 Encounter for preprocedural cardiovascular examination: Secondary | ICD-10-CM | POA: Diagnosis present

## 2015-05-07 HISTORY — DX: Type 1 diabetes mellitus without complications: E10.9

## 2015-05-07 LAB — CBC
HCT: 30.2 % — ABNORMAL LOW (ref 36.0–46.0)
HEMOGLOBIN: 9.7 g/dL — AB (ref 12.0–15.0)
MCH: 28.2 pg (ref 26.0–34.0)
MCHC: 32.1 g/dL (ref 30.0–36.0)
MCV: 87.8 fL (ref 78.0–100.0)
PLATELETS: 227 10*3/uL (ref 150–400)
RBC: 3.44 MIL/uL — ABNORMAL LOW (ref 3.87–5.11)
RDW: 14.3 % (ref 11.5–15.5)
WBC: 4.8 10*3/uL (ref 4.0–10.5)

## 2015-05-07 LAB — BASIC METABOLIC PANEL
Anion gap: 11 (ref 5–15)
BUN: 55 mg/dL — ABNORMAL HIGH (ref 6–20)
CO2: 18 mmol/L — AB (ref 22–32)
CREATININE: 5.83 mg/dL — AB (ref 0.44–1.00)
Calcium: 7.4 mg/dL — ABNORMAL LOW (ref 8.9–10.3)
Chloride: 108 mmol/L (ref 101–111)
GFR calc Af Amer: 10 mL/min — ABNORMAL LOW (ref >60)
GFR calc non Af Amer: 9 mL/min — ABNORMAL LOW (ref >60)
GLUCOSE: 172 mg/dL — AB (ref 65–99)
Potassium: 4.7 mmol/L (ref 3.5–5.1)
Sodium: 137 mmol/L (ref 135–145)

## 2015-05-07 LAB — HCG, SERUM, QUALITATIVE: Preg, Serum: NEGATIVE

## 2015-05-07 LAB — SURGICAL PCR SCREEN
MRSA, PCR: NEGATIVE
STAPHYLOCOCCUS AUREUS: NEGATIVE

## 2015-05-07 LAB — GLUCOSE, CAPILLARY: Glucose-Capillary: 148 mg/dL — ABNORMAL HIGH (ref 65–99)

## 2015-05-07 NOTE — Progress Notes (Addendum)
Pt has an appt today at 3:30 for an Echocardiogram. Pt denies any recent chest pain or sob.

## 2015-05-07 NOTE — Progress Notes (Signed)
Called Medical Records at Dr. Hazle Coca office for a copy of EKG that was done 05/04/15. Spoke with Larita Fife and she will fax it to [place].

## 2015-05-07 NOTE — Pre-Procedure Instructions (Signed)
[name]  05/07/2015     Your procedure is scheduled on Thursday, May 10, 2015 at 11:00 AM.   Report to [facility] Entrance "A" Admitting Office at 9:00 AM.   Call this number if you have problems the morning of surgery [phone]   Any questions prior to day of surgery, please call [phone] between 8 & 4 PM.   Remember:  Do not eat food or drink liquids after midnight Wednesday, 05/09/15.  Take these medicines the morning of surgery with A SIP OF WATER: Carvedilol (Coreg), Hydralazine (Apresoline), Isosorbide Dinitrate (Isordil), Oxycodone - if needed, Albuterol nebulizer - if needed, Albuterol inhaler - if needed, Pulmicort inhaler - if needed.  See Below for diabetic medications instructions.   How to Manage Your Diabetes Before Surgery   Why is it important to control my blood sugar before and after surgery?   Improving blood sugar levels before and after surgery helps healing and can limit problems.   A way of improving blood sugar control is eating a healthy diet by:  - Eating less sugar and carbohydrates  - Increasing activity/exercise  - Talk with your doctor about reaching your blood sugar goals   High blood sugars (greater than 180 mg/dL) can raise your risk of infections and slow down your recovery so you will need to focus on controlling your diabetes during the weeks before surgery.   Make sure that the doctor who takes care of your diabetes knows about your planned surgery including the date and location.  How do I manage my blood sugars before surgery?   Check your blood sugar at least 4 times a day, 2 days before surgery to make sure that they are not too high or low.    Check your blood sugar the morning of your surgery when you wake up and every 2 hours until you get to the Short-Stay unit.   Treat a low blood sugar (less than 70 mg/dL) with 1/2 cup of clear juice (cranberry or apple), 4 glucose tablets, OR glucose gel.   Recheck  blood sugar in 15 minutes after treatment (to make sure it is greater than 70 mg/dL).  If blood sugar is not greater than 70 mg/dL on re-check, call [phone] for further instructions.    Report your blood sugar to the Short-Stay nurse when you get to Short-Stay.  References:  [facility], 2007 "How to Manage your Diabetes Before and After Surgery".  What do I do about my diabetes medications?    THE NIGHT BEFORE SURGERY, take 16 units of Lantus Insulin. Use your Novolog as usual   THE MORNING of SURGERY, if your CBG is greater than 220 mg/dL, you may take 1/2 of your sliding scale (correction) dose of insulin (Novolog)   Do not wear jewelry, make-up or nail polish.  Do not wear lotions, powders, or perfumes.  You may NOT wear deodorant.  Do not shave 48 hours prior to surgery.    Do not bring valuables to the hospital.  [facility] is not responsible for any belongings or valuables.  Contacts, dentures or bridgework may not be worn into surgery.  Leave your suitcase in the car.  After surgery it may be brought to your room.  For patients admitted to the hospital, discharge time will be determined by your treatment team.  Patients discharged the day of surgery will not be allowed to drive home.   Special instructions:  [place] - Preparing  for Surgery  Before surgery, you can play an important role.  Because skin is not sterile, your skin needs to be as free of germs as possible.  You can reduce the number of germs on you skin by washing with CHG (chlorahexidine gluconate) soap before surgery.  CHG is an antiseptic cleaner which kills germs and bonds with the skin to continue killing germs even after washing.  Please DO NOT use if you have an allergy to CHG or antibacterial soaps.  If your skin becomes reddened/irritated stop using the CHG and inform your nurse when you arrive at Short Stay.  Do not shave (including legs and underarms) for at least  48 hours prior to the first CHG shower.  You may shave your face.  Please follow these instructions carefully:   1.  Shower with CHG Soap the night before surgery and the                                morning of Surgery.  2.  If you choose to wash your hair, wash your hair first as usual with your       normal shampoo.  3.  After you shampoo, rinse your hair and body thoroughly to remove the                      Shampoo.  4.  Use CHG as you would any other liquid soap.  You can apply chg directly       to the skin and wash gently with scrungie or a clean washcloth.  5.  Apply the CHG Soap to your body ONLY FROM THE NECK DOWN.        Do not use on open wounds or open sores.  Avoid contact with your eyes, ears, mouth and genitals (private parts).  Wash genitals (private parts) with your normal soap.  6.  Wash thoroughly, paying special attention to the area where your surgery        will be performed.  7.  Thoroughly rinse your body with warm water from the neck down.  8.  DO NOT shower/wash with your normal soap after using and rinsing off       the CHG Soap.  9.  Pat yourself dry with a clean towel.            10.  Wear clean pajamas.            11.  Place clean sheets on your bed the night of your first shower and do not        sleep with pets.  Day of Surgery  Do not apply any lotions/deodorants the morning of surgery.  Please wear clean clothes to the hospital.  Please read over the following fact sheets that you were given. Pain Booklet, Coughing and Deep Breathing, MRSA Information and Surgical Site Infection Prevention

## 2015-05-08 ENCOUNTER — Encounter: Payer: Self-pay | Admitting: Vascular Surgery

## 2015-05-09 ENCOUNTER — Ambulatory Visit (INDEPENDENT_AMBULATORY_CARE_PROVIDER_SITE_OTHER): Payer: Self-pay | Admitting: Vascular Surgery

## 2015-05-09 ENCOUNTER — Ambulatory Visit (HOSPITAL_COMMUNITY)
Admission: RE | Admit: 2015-05-09 | Discharge: 2015-05-09 | Disposition: A | Discharge status: Home / Self Care | Payer: Medicaid Other | Source: Ambulatory Visit | Attending: Vascular Surgery | Admitting: Vascular Surgery

## 2015-05-09 ENCOUNTER — Encounter: Payer: Self-pay | Admitting: Vascular Surgery

## 2015-05-09 ENCOUNTER — Telehealth: Payer: Self-pay | Admitting: Cardiovascular Disease

## 2015-05-09 VITALS — BP 184/87 | HR 85 | Ht 68.0 in | Wt 337.0 lb

## 2015-05-09 DIAGNOSIS — N186 End stage renal disease: Secondary | ICD-10-CM | POA: Diagnosis not present

## 2015-05-09 DIAGNOSIS — Z4931 Encounter for adequacy testing for hemodialysis: Secondary | ICD-10-CM

## 2015-05-09 DIAGNOSIS — N184 Chronic kidney disease, stage 4 (severe): Secondary | ICD-10-CM

## 2015-05-09 MED ORDER — DEXTROSE 5 % IV SOLN
3.0000 g | INTRAVENOUS | Status: AC
  Administered 2015-05-10: 3 g via INTRAVENOUS
  Filled 2015-05-09 (×2): qty 3000

## 2015-05-09 MED ORDER — POVIDONE-IODINE 7.5 % EX SOLN
CUTANEOUS | Status: DC
  Filled 2015-05-09: qty 118

## 2015-05-09 NOTE — Telephone Encounter (Signed)
She needs to know if patient have been cleared for surgery for tomorrow.Please call asap,surgery is scheduled for tomorrow.

## 2015-05-09 NOTE — Progress Notes (Signed)
Anesthesia Follow-up: See my note from 04/30/15. Since then patient was re-evaluated by cardiologist Dr. Allyson Sabal on 05/04/15.  See his note for details. He ordered an echocardiogram (see below).  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 Cleared at moderately increased CV peri op risk for Surg tomorrow."  05/07/15 Echo: Study Conclusions - Left ventricle: There is akinesis of the basal and mid inferior, inferoseptal, anteroseptal walls. The cavity size was mildly dilated. There was mild concentric hypertrophy. Systolic function was moderately reduced. The estimated ejection fraction was in the range of 35% to 40%. Features are consistent with a pseudonormal left ventricular filling pattern, with concomitantabnormal relaxation and increased filling pressure (grade 2 diastolic dysfunction). Doppler parameters are consistent with elevated ventricular end-diastolic filling pressure. - Aortic valve: Trileaflet; normal thickness leaflets. Transvalvular velocity was within the normal range. There was no stenosis. There was no regurgitation. - Aortic root: The aortic root was normal in size. - Mitral valve: Structurally normal valve. There was mild regurgitation. - Left atrium: The atrium was mildly dilated. - Right ventricle: The cavity size was normal. Wall thickness was normal. Systolic function was normal. - Tricuspid valve: There was mild regurgitation. - Pulmonary arteries: Systolic pressure was severely increased. PA peak pressure: 62 mm Hg (S). - Pericardium, extracardiac: A mild pericardial effusion was identified posterior to the heart. Features were not consistent with tamponade physiology. Impressions: LV endocardium is poorly visualized. There appears to be no significant difference in LVEF since the last study on 09/14/2015. Echocontrast should be considered for further evaluation. There is severe pulmonary hypertension.  Right shoulder arthroscopy biceps  tenotomy, debridement labral tear has been rescheduled for tomorrow with Dr. Ave Filter. Further evaluation by her assigned anesthesiologist on the day of surgery.  [name] [facility] Short Stay Center/Anesthesiology Phone [phone] 05/09/2015 4:37 PM

## 2015-05-09 NOTE — Progress Notes (Signed)
Patient name: [name] MRN: [id number] DOB: 1980-10-23 Sex: female  REASON FOR VISIT: Follow up after left radial cephalic AV fistula  HPI: [name] is a 34 y.o. female who I performed a left radial cephalic AV fistula on 03/27/2015. It was a 2.5 mm radial artery and a 3.5 mm forearm cephalic vein. She comes in for a follow up visit. She is not on dialysis. She denies pain or paresthesias in her left upper extremity.  Current Outpatient Prescriptions  Medication Sig Dispense Refill  . albuterol (PROVENTIL HFA;VENTOLIN HFA) 108 (90 BASE) MCG/ACT inhaler Inhale 2 puffs into the lungs every 6 (six) hours as needed for wheezing or shortness of breath.     Marland Kitchen albuterol (PROVENTIL) (2.5 MG/3ML) 0.083% nebulizer solution Take 2.5 mg by nebulization every 6 (six) hours as needed for wheezing or shortness of breath.    Marland Kitchen atorvastatin (LIPITOR) 40 MG tablet Take 1 tablet (40 mg total) by mouth daily. (Patient taking differently: Take 40 mg by mouth at bedtime. ) 30 tablet 3  . budesonide (PULMICORT) 0.25 MG/2ML nebulizer solution Take 2 mLs (0.25 mg total) by nebulization 2 (two) times daily. (Patient taking differently: Take 0.25 mg by nebulization 2 (two) times daily as needed (wheezing/shortness of breath). ) 60 mL 12  . calcitRIOL (ROCALTROL) 0.25 MCG capsule Take 0.25 mcg by mouth daily.    . carvedilol (COREG) 25 MG tablet 1 tablet PO BID. (Patient taking differently: Take 25 mg by mouth 2 (two) times daily with a meal. ) 60 tablet 0  . diphenhydrAMINE (BENADRYL) 25 MG tablet Take 50 mg by mouth at bedtime as needed for allergies.    . furosemide (LASIX) 80 MG tablet Take 160 mg by mouth 2 (two) times daily.     . hydrALAZINE (APRESOLINE) 25 MG tablet Take 1.5 tablets (37.5 mg total) by mouth every 8 (eight) hours. 120 tablet 0  . insulin aspart (NOVOLOG FLEXPEN) 100 UNIT/ML FlexPen Inject 12-20 Units into the skin 3 (three) times daily with meals. Per sliding scale - based on carb count and CBG      . Insulin Glargine (LANTUS SOLOSTAR) 100 UNIT/ML Solostar Pen Inject 20 Units into the skin at bedtime.    . isosorbide dinitrate (ISORDIL) 20 MG tablet TAKE 1 TABLET BY MOUTH THREE TIMES DAILY (Patient taking differently: Take 20 mg by mouth 3 (three) times daily. ) 270 tablet 0  . oxyCODONE (ROXICODONE) 5 MG immediate release tablet Take 1 tablet (5 mg total) by mouth every 6 (six) hours as needed for severe pain. 20 tablet 0   No current facility-administered medications for this visit.   REVIEW OF SYSTEMS: Arly.[email or web address] ] denotes positive finding; [  ] denotes negative finding  CARDIOVASCULAR:  [ ]  chest pain   [ ]  dyspnea on exertion    CONSTITUTIONAL:  [ ]  fever   [ ]  chills  PHYSICAL EXAM: Filed Vitals:   05/09/15 1154 05/09/15 1157  BP: 182/87 184/87  Pulse: 85   Height: 5\' 8"  (1.727 m)   Weight: 337 lb (152.862 kg)   SpO2: 100%    GENERAL: The patient is a well-nourished female, in no acute distress. The vital signs are documented above. CARDIOVASCULAR: There is a regular rate and rhythm. PULMONARY: There is good air exchange bilaterally without wheezing or rales. Her left radiocephalic fistula has a good thrill and appears to be gradually maturing. Her incisions are healing nicely.  I have independently interpreted her duplex scan today that shows some  elevated velocities in the proximal fistula. The diameters of the fistula range from 0.39 cm-0.62 cm.   MEDICAL ISSUES:  STATUS POST LEFT RADIOCEPHALIC AV FISTULA: Although the duplex suggests some elevated velocities in the proximal fistula, the thrill in the fistula is actually quite good. Diameters are improving and I think this has a reasonable chance of success. I have recommended a follow up duplex scan in 2 months and I'll see her back at that time. She knows to call sooner if she has problems.  Waverly [facility]: [phone]

## 2015-05-09 NOTE — Telephone Encounter (Signed)
Spoke with Albin Felling, patient is cleared for surgery.  (see echo results)

## 2015-05-09 NOTE — Addendum Note (Signed)
Addended by: Adria Dill L on: 05/09/2015 01:07 PM   Modules accepted: Orders

## 2015-05-10 ENCOUNTER — Encounter (HOSPITAL_COMMUNITY): Payer: Self-pay | Admitting: Anesthesiology

## 2015-05-10 ENCOUNTER — Inpatient Hospital Stay (HOSPITAL_COMMUNITY)
Admission: RE | Admit: 2015-05-10 | Discharge: 2015-05-13 | DRG: 500 | Disposition: A | Discharge status: Home / Self Care | Payer: Medicaid Other | Source: Ambulatory Visit | Attending: Orthopedic Surgery | Admitting: Orthopedic Surgery

## 2015-05-10 ENCOUNTER — Encounter (HOSPITAL_COMMUNITY)
Admission: RE | Disposition: A | Discharge status: Home / Self Care | Payer: Self-pay | Source: Ambulatory Visit | Attending: Orthopedic Surgery

## 2015-05-10 ENCOUNTER — Ambulatory Visit (HOSPITAL_COMMUNITY): Payer: Medicaid Other | Admitting: Vascular Surgery

## 2015-05-10 ENCOUNTER — Ambulatory Visit (HOSPITAL_COMMUNITY): Payer: Medicaid Other

## 2015-05-10 DIAGNOSIS — IMO0002 Reserved for concepts with insufficient information to code with codable children: Secondary | ICD-10-CM | POA: Diagnosis present

## 2015-05-10 DIAGNOSIS — Z6841 Body Mass Index (BMI) 40.0 and over, adult: Secondary | ICD-10-CM

## 2015-05-10 DIAGNOSIS — Z79899 Other long term (current) drug therapy: Secondary | ICD-10-CM

## 2015-05-10 DIAGNOSIS — I5041 Acute combined systolic (congestive) and diastolic (congestive) heart failure: Secondary | ICD-10-CM | POA: Diagnosis present

## 2015-05-10 DIAGNOSIS — J9691 Respiratory failure, unspecified with hypoxia: Secondary | ICD-10-CM | POA: Diagnosis not present

## 2015-05-10 DIAGNOSIS — E785 Hyperlipidemia, unspecified: Secondary | ICD-10-CM | POA: Diagnosis present

## 2015-05-10 DIAGNOSIS — N185 Chronic kidney disease, stage 5: Secondary | ICD-10-CM | POA: Diagnosis present

## 2015-05-10 DIAGNOSIS — I428 Other cardiomyopathies: Secondary | ICD-10-CM

## 2015-05-10 DIAGNOSIS — I429 Cardiomyopathy, unspecified: Secondary | ICD-10-CM | POA: Diagnosis present

## 2015-05-10 DIAGNOSIS — J449 Chronic obstructive pulmonary disease, unspecified: Secondary | ICD-10-CM | POA: Diagnosis present

## 2015-05-10 DIAGNOSIS — Z9889 Other specified postprocedural states: Secondary | ICD-10-CM

## 2015-05-10 DIAGNOSIS — E1129 Type 2 diabetes mellitus with other diabetic kidney complication: Secondary | ICD-10-CM | POA: Diagnosis present

## 2015-05-10 DIAGNOSIS — M659 Synovitis and tenosynovitis, unspecified: Secondary | ICD-10-CM | POA: Diagnosis present

## 2015-05-10 DIAGNOSIS — S43431A Superior glenoid labrum lesion of right shoulder, initial encounter: Principal | ICD-10-CM | POA: Diagnosis present

## 2015-05-10 DIAGNOSIS — I120 Hypertensive chronic kidney disease with stage 5 chronic kidney disease or end stage renal disease: Secondary | ICD-10-CM | POA: Diagnosis present

## 2015-05-10 DIAGNOSIS — Z794 Long term (current) use of insulin: Secondary | ICD-10-CM

## 2015-05-10 DIAGNOSIS — Z01811 Encounter for preprocedural respiratory examination: Secondary | ICD-10-CM

## 2015-05-10 DIAGNOSIS — I10 Essential (primary) hypertension: Secondary | ICD-10-CM | POA: Diagnosis present

## 2015-05-10 DIAGNOSIS — I2510 Atherosclerotic heart disease of native coronary artery without angina pectoris: Secondary | ICD-10-CM | POA: Diagnosis present

## 2015-05-10 DIAGNOSIS — E1165 Type 2 diabetes mellitus with hyperglycemia: Secondary | ICD-10-CM | POA: Diagnosis present

## 2015-05-10 DIAGNOSIS — I272 Other secondary pulmonary hypertension: Secondary | ICD-10-CM | POA: Diagnosis present

## 2015-05-10 DIAGNOSIS — E1122 Type 2 diabetes mellitus with diabetic chronic kidney disease: Secondary | ICD-10-CM | POA: Diagnosis present

## 2015-05-10 DIAGNOSIS — I214 Non-ST elevation (NSTEMI) myocardial infarction: Secondary | ICD-10-CM | POA: Diagnosis present

## 2015-05-10 DIAGNOSIS — X58XXXA Exposure to other specified factors, initial encounter: Secondary | ICD-10-CM | POA: Diagnosis present

## 2015-05-10 DIAGNOSIS — J9600 Acute respiratory failure, unspecified whether with hypoxia or hypercapnia: Secondary | ICD-10-CM | POA: Diagnosis present

## 2015-05-10 HISTORY — PX: SHOULDER ARTHROSCOPY WITH BICEPSTENOTOMY: SHX6204

## 2015-05-10 LAB — POCT I-STAT 4, (NA,K, GLUC, HGB,HCT)
GLUCOSE: 144 mg/dL — AB (ref 65–99)
HCT: 27 % — ABNORMAL LOW (ref 36.0–46.0)
HEMOGLOBIN: 9.2 g/dL — AB (ref 12.0–15.0)
POTASSIUM: 4.3 mmol/L (ref 3.5–5.1)
Sodium: 137 mmol/L (ref 135–145)

## 2015-05-10 LAB — GLUCOSE, CAPILLARY
Glucose-Capillary: 140 mg/dL — ABNORMAL HIGH (ref 65–99)
Glucose-Capillary: 212 mg/dL — ABNORMAL HIGH (ref 65–99)
Glucose-Capillary: 211 mg/dL — ABNORMAL HIGH (ref 65–99)
Glucose-Capillary: 133 mg/dL — ABNORMAL HIGH (ref 65–99)

## 2015-05-10 SURGERY — SHOULDER ARTHROSCOPY WITH BICEPS TENOTOMY
Anesthesia: General | Site: Shoulder | Laterality: Right

## 2015-05-10 MED ORDER — FUROSEMIDE 10 MG/ML IJ SOLN
INTRAMUSCULAR | Status: DC | PRN
  Administered 2015-05-10: 20 mg via INTRAMUSCULAR
  Administered 2015-05-10: 60 mg via INTRAMUSCULAR

## 2015-05-10 MED ORDER — SODIUM CHLORIDE 0.9 % IV SOLN
10.0000 mg | INTRAVENOUS | Status: DC | PRN
  Administered 2015-05-10: 25 ug/min via INTRAVENOUS

## 2015-05-10 MED ORDER — METOCLOPRAMIDE HCL 5 MG/ML IJ SOLN
5.0000 mg | Freq: Three times a day (TID) | INTRAMUSCULAR | Status: DC | PRN
  Administered 2015-05-10 – 2015-05-13 (×5): 10 mg via INTRAVENOUS
  Filled 2015-05-10 (×5): qty 2

## 2015-05-10 MED ORDER — MORPHINE SULFATE 2 MG/ML IJ SOLN
2.0000 mg | INTRAMUSCULAR | Status: DC | PRN
  Administered 2015-05-11 – 2015-05-12 (×6): 2 mg via INTRAVENOUS
  Filled 2015-05-10 (×6): qty 1

## 2015-05-10 MED ORDER — INSULIN ASPART 100 UNIT/ML FLEXPEN: 12.0000 [IU] | PEN_INJECTOR | Freq: Three times a day (TID) | SUBCUTANEOUS | Status: DC

## 2015-05-10 MED ORDER — DIPHENHYDRAMINE HCL 12.5 MG/5ML PO ELIX: 12.5000 mg | ORAL_SOLUTION | ORAL | Status: DC | PRN

## 2015-05-10 MED ORDER — EPHEDRINE SULFATE 50 MG/ML IJ SOLN
INTRAMUSCULAR | Status: DC | PRN
  Administered 2015-05-10: 15 mg via INTRAVENOUS
  Administered 2015-05-10: 5 mg via INTRAVENOUS
  Administered 2015-05-10: 10 mg via INTRAVENOUS

## 2015-05-10 MED ORDER — FUROSEMIDE 40 MG PO TABS
160.0000 mg | ORAL_TABLET | Freq: Two times a day (BID) | ORAL | Status: DC
  Administered 2015-05-10 – 2015-05-13 (×5): 160 mg via ORAL
  Filled 2015-05-10 (×5): qty 4

## 2015-05-10 MED ORDER — FENTANYL CITRATE (PF) 100 MCG/2ML IJ SOLN
INTRAMUSCULAR | Status: AC
  Filled 2015-05-10: qty 2

## 2015-05-10 MED ORDER — PROPOFOL 10 MG/ML IV BOLUS
INTRAVENOUS | Status: DC | PRN
  Administered 2015-05-10 (×2): 100 mg via INTRAVENOUS

## 2015-05-10 MED ORDER — ACETAMINOPHEN 650 MG RE SUPP: 650.0000 mg | Freq: Four times a day (QID) | RECTAL | Status: DC | PRN

## 2015-05-10 MED ORDER — HYDROMORPHONE HCL 1 MG/ML IJ SOLN: 0.2500 mg | INTRAMUSCULAR | Status: DC | PRN

## 2015-05-10 MED ORDER — MIDAZOLAM HCL 2 MG/2ML IJ SOLN
INTRAMUSCULAR | Status: AC
  Filled 2015-05-10: qty 2

## 2015-05-10 MED ORDER — ONDANSETRON HCL 4 MG/2ML IJ SOLN: 4.0000 mg | Freq: Four times a day (QID) | INTRAMUSCULAR | Status: DC | PRN

## 2015-05-10 MED ORDER — ALBUTEROL SULFATE HFA 108 (90 BASE) MCG/ACT IN AERS: 2.0000 | INHALATION_SPRAY | Freq: Four times a day (QID) | RESPIRATORY_TRACT | Status: DC | PRN

## 2015-05-10 MED ORDER — PHENOL 1.4 % MT LIQD: 1.0000 | OROMUCOSAL | Status: DC | PRN

## 2015-05-10 MED ORDER — HYDRALAZINE HCL 25 MG PO TABS
37.5000 mg | ORAL_TABLET | Freq: Three times a day (TID) | ORAL | Status: DC
  Administered 2015-05-10 – 2015-05-11 (×3): 37.5 mg via ORAL
  Filled 2015-05-10 (×3): qty 2

## 2015-05-10 MED ORDER — INSULIN GLARGINE 100 UNIT/ML ~~LOC~~ SOLN
20.0000 [IU] | Freq: Every day | SUBCUTANEOUS | Status: DC
  Administered 2015-05-10 – 2015-05-11 (×2): 20 [IU] via SUBCUTANEOUS
  Filled 2015-05-10 (×4): qty 0.2

## 2015-05-10 MED ORDER — SODIUM CHLORIDE 0.9 % IV SOLN
INTRAVENOUS | Status: DC
  Administered 2015-05-10: 10:00:00 via INTRAVENOUS

## 2015-05-10 MED ORDER — ALBUTEROL SULFATE (2.5 MG/3ML) 0.083% IN NEBU: 2.5000 mg | INHALATION_SOLUTION | Freq: Four times a day (QID) | RESPIRATORY_TRACT | Status: DC | PRN

## 2015-05-10 MED ORDER — METOCLOPRAMIDE HCL 5 MG PO TABS
5.0000 mg | ORAL_TABLET | Freq: Three times a day (TID) | ORAL | Status: DC | PRN
  Filled 2015-05-10: qty 2

## 2015-05-10 MED ORDER — SUCCINYLCHOLINE CHLORIDE 20 MG/ML IJ SOLN
INTRAMUSCULAR | Status: DC | PRN
  Administered 2015-05-10: 100 mg via INTRAVENOUS

## 2015-05-10 MED ORDER — ONDANSETRON HCL 4 MG PO TABS
4.0000 mg | ORAL_TABLET | Freq: Four times a day (QID) | ORAL | Status: DC | PRN
  Administered 2015-05-10: 4 mg via ORAL
  Filled 2015-05-10: qty 1

## 2015-05-10 MED ORDER — ALBUTEROL SULFATE (2.5 MG/3ML) 0.083% IN NEBU
2.5000 mg | INHALATION_SOLUTION | Freq: Four times a day (QID) | RESPIRATORY_TRACT | Status: DC | PRN
  Administered 2015-05-11: 2.5 mg via RESPIRATORY_TRACT
  Filled 2015-05-10: qty 3

## 2015-05-10 MED ORDER — DEXMEDETOMIDINE HCL IN NACL 200 MCG/50ML IV SOLN
INTRAVENOUS | Status: AC
  Filled 2015-05-10: qty 50

## 2015-05-10 MED ORDER — INSULIN ASPART 100 UNIT/ML ~~LOC~~ SOLN
0.0000 [IU] | Freq: Three times a day (TID) | SUBCUTANEOUS | Status: DC
Start: 2015-05-10 — End: 2015-05-13
  Administered 2015-05-10: 7 [IU] via SUBCUTANEOUS
  Administered 2015-05-11: 3 [IU] via SUBCUTANEOUS
  Administered 2015-05-11: 4 [IU] via SUBCUTANEOUS
  Administered 2015-05-11 – 2015-05-13 (×3): 3 [IU] via SUBCUTANEOUS

## 2015-05-10 MED ORDER — BUPIVACAINE-EPINEPHRINE (PF) 0.5% -1:200000 IJ SOLN
INTRAMUSCULAR | Status: DC | PRN
  Administered 2015-05-10: 30 mL via PERINEURAL

## 2015-05-10 MED ORDER — SODIUM CHLORIDE 0.9 % IV SOLN
INTRAVENOUS | Status: DC
  Administered 2015-05-10: 100 mL/h via INTRAVENOUS
  Administered 2015-05-10 – 2015-05-12 (×2): via INTRAVENOUS

## 2015-05-10 MED ORDER — ISOSORBIDE DINITRATE 20 MG PO TABS
20.0000 mg | ORAL_TABLET | Freq: Three times a day (TID) | ORAL | Status: DC
Start: 2015-05-10 — End: 2015-05-11
  Administered 2015-05-10 (×2): 20 mg via ORAL
  Filled 2015-05-10 (×5): qty 1

## 2015-05-10 MED ORDER — ACETAMINOPHEN 325 MG PO TABS: 650.0000 mg | ORAL_TABLET | Freq: Four times a day (QID) | ORAL | Status: DC | PRN

## 2015-05-10 MED ORDER — ALUMINUM HYDROXIDE GEL 320 MG/5ML PO SUSP
15.0000 mL | ORAL | Status: DC | PRN
  Filled 2015-05-10: qty 30

## 2015-05-10 MED ORDER — BUDESONIDE 0.25 MG/2ML IN SUSP: 0.2500 mg | Freq: Two times a day (BID) | RESPIRATORY_TRACT | Status: DC | PRN

## 2015-05-10 MED ORDER — PHENYLEPHRINE HCL 10 MG/ML IJ SOLN
INTRAMUSCULAR | Status: DC | PRN
  Administered 2015-05-10: 80 ug via INTRAVENOUS

## 2015-05-10 MED ORDER — PROMETHAZINE HCL 25 MG/ML IJ SOLN: 6.2500 mg | INTRAMUSCULAR | Status: DC | PRN

## 2015-05-10 MED ORDER — ONDANSETRON HCL 4 MG/2ML IJ SOLN
INTRAMUSCULAR | Status: DC | PRN
  Administered 2015-05-10: 4 mg via INTRAVENOUS

## 2015-05-10 MED ORDER — OXYCODONE HCL 5 MG PO TABS
5.0000 mg | ORAL_TABLET | ORAL | Status: DC | PRN
  Administered 2015-05-11 – 2015-05-12 (×7): 10 mg via ORAL
  Filled 2015-05-10 (×7): qty 2

## 2015-05-10 MED ORDER — MENTHOL 3 MG MT LOZG: 1.0000 | LOZENGE | OROMUCOSAL | Status: DC | PRN

## 2015-05-10 MED ORDER — ATORVASTATIN CALCIUM 40 MG PO TABS
40.0000 mg | ORAL_TABLET | Freq: Every day | ORAL | Status: DC
  Administered 2015-05-10 – 2015-05-12 (×3): 40 mg via ORAL
  Filled 2015-05-10 (×3): qty 1

## 2015-05-10 MED ORDER — MIDAZOLAM HCL 5 MG/5ML IJ SOLN
INTRAMUSCULAR | Status: DC | PRN
  Administered 2015-05-10 (×2): 2 mg via INTRAVENOUS

## 2015-05-10 MED ORDER — FENTANYL CITRATE (PF) 250 MCG/5ML IJ SOLN
INTRAMUSCULAR | Status: AC
  Filled 2015-05-10: qty 5

## 2015-05-10 MED ORDER — SODIUM CHLORIDE 0.9 % IR SOLN
Status: DC | PRN
  Administered 2015-05-10 (×2): 3000 mL

## 2015-05-10 MED ORDER — CEFAZOLIN SODIUM-DEXTROSE 2-3 GM-% IV SOLR
2.0000 g | Freq: Four times a day (QID) | INTRAVENOUS | Status: AC
  Administered 2015-05-10 – 2015-05-11 (×3): 2 g via INTRAVENOUS
  Filled 2015-05-10 (×3): qty 50

## 2015-05-10 MED ORDER — FENTANYL CITRATE (PF) 100 MCG/2ML IJ SOLN
INTRAMUSCULAR | Status: DC | PRN
  Administered 2015-05-10: 100 ug via INTRAVENOUS
  Administered 2015-05-10: 50 ug via INTRAVENOUS
  Administered 2015-05-10 (×2): 100 ug via INTRAVENOUS

## 2015-05-10 MED ORDER — DEXMEDETOMIDINE HCL IN NACL 400 MCG/100ML IV SOLN
INTRAVENOUS | Status: DC | PRN
  Administered 2015-05-10: .7 ug/kg/h via INTRAVENOUS

## 2015-05-10 MED ORDER — MEPERIDINE HCL 25 MG/ML IJ SOLN: 6.2500 mg | INTRAMUSCULAR | Status: DC | PRN

## 2015-05-10 MED ORDER — CARVEDILOL 25 MG PO TABS
25.0000 mg | ORAL_TABLET | Freq: Two times a day (BID) | ORAL | Status: DC
  Administered 2015-05-10 – 2015-05-13 (×6): 25 mg via ORAL
  Filled 2015-05-10 (×6): qty 1

## 2015-05-10 MED ORDER — MIDAZOLAM HCL 2 MG/2ML IJ SOLN: 0.5000 mg | Freq: Once | INTRAMUSCULAR | Status: DC | PRN

## 2015-05-10 MED ORDER — ARTIFICIAL TEARS OP OINT
TOPICAL_OINTMENT | OPHTHALMIC | Status: DC | PRN
  Administered 2015-05-10: 1 via OPHTHALMIC

## 2015-05-10 SURGICAL SUPPLY — 58 items
BLADE LONG MED 31MMX9MM (MISCELLANEOUS)
BLADE LONG MED 31X9 (MISCELLANEOUS) IMPLANT
BLADE SURG 11 STRL SS (BLADE) ×3 IMPLANT
BUR OVAL 4.0 (BURR) ×3 IMPLANT
CANISTER OMNI JUG 16 LITER (MISCELLANEOUS) ×3 IMPLANT
CANNULA 5.75X71 LONG (CANNULA) ×3 IMPLANT
CHLORAPREP W/TINT 26ML (MISCELLANEOUS) ×3 IMPLANT
COVER SURGICAL LIGHT HANDLE (MISCELLANEOUS) ×3 IMPLANT
DRAPE INCISE IOBAN 66X45 STRL (DRAPES) ×3 IMPLANT
DRAPE STERI 35X30 U-POUCH (DRAPES) ×3 IMPLANT
DRAPE SURG 17X23 STRL (DRAPES) ×3 IMPLANT
DRAPE U-SHAPE 47X51 STRL (DRAPES) ×3 IMPLANT
DRSG EMULSION OIL 3X3 NADH (GAUZE/BANDAGES/DRESSINGS) IMPLANT
DRSG PAD ABDOMINAL 8X10 ST (GAUZE/BANDAGES/DRESSINGS) ×3 IMPLANT
ELECT REM PT RETURN 9FT ADLT (ELECTROSURGICAL) ×3
ELECTRODE REM PT RTRN 9FT ADLT (ELECTROSURGICAL) ×1 IMPLANT
GAUZE SPONGE 4X4 12PLY STRL (GAUZE/BANDAGES/DRESSINGS) ×3 IMPLANT
GAUZE XEROFORM 1X8 LF (GAUZE/BANDAGES/DRESSINGS) ×3 IMPLANT
GLOVE BIO SURGEON STRL SZ7 (GLOVE) ×3 IMPLANT
GLOVE BIO SURGEON STRL SZ7.5 (GLOVE) ×3 IMPLANT
GLOVE BIOGEL PI IND STRL 6.5 (GLOVE) ×1 IMPLANT
GLOVE BIOGEL PI IND STRL 7.0 (GLOVE) ×1 IMPLANT
GLOVE BIOGEL PI IND STRL 8 (GLOVE) ×1 IMPLANT
GLOVE BIOGEL PI INDICATOR 6.5 (GLOVE) ×2
GLOVE BIOGEL PI INDICATOR 7.0 (GLOVE) ×2
GLOVE BIOGEL PI INDICATOR 8 (GLOVE) ×2
GLOVE INDICATOR 6.5 STRL GRN (GLOVE) ×3 IMPLANT
GLOVE SURG SS PI 6.0 STRL IVOR (GLOVE) ×3 IMPLANT
GLOVE SURG SS PI 8.0 STRL IVOR (GLOVE) ×3 IMPLANT
GOWN STRL REUS W/ TWL LRG LVL3 (GOWN DISPOSABLE) ×3 IMPLANT
GOWN STRL REUS W/TWL LRG LVL3 (GOWN DISPOSABLE) ×6
KIT BASIN OR (CUSTOM PROCEDURE TRAY) ×3 IMPLANT
KIT ROOM TURNOVER OR (KITS) ×3 IMPLANT
MANIFOLD NEPTUNE II (INSTRUMENTS) ×3 IMPLANT
NEEDLE HYPO 25GX1X1/2 BEV (NEEDLE) ×3 IMPLANT
NEEDLE SPNL 18GX3.5 QUINCKE PK (NEEDLE) ×3 IMPLANT
NS IRRIG 1000ML POUR BTL (IV SOLUTION) ×3 IMPLANT
PACK SHOULDER (CUSTOM PROCEDURE TRAY) ×3 IMPLANT
PAD ARMBOARD 7.5X6 YLW CONV (MISCELLANEOUS) ×6 IMPLANT
RESECTOR FULL RADIUS 4.2MM (BLADE) ×3 IMPLANT
SLING ARM LRG ADULT FOAM STRAP (SOFTGOODS) ×3 IMPLANT
SLING ARM MED ADULT FOAM STRAP (SOFTGOODS) IMPLANT
SPONGE GAUZE 4X4 12PLY STER LF (GAUZE/BANDAGES/DRESSINGS) ×3 IMPLANT
SPONGE LAP 4X18 X RAY DECT (DISPOSABLE) IMPLANT
SUPPORT WRAP ARM LG (MISCELLANEOUS) ×3 IMPLANT
SUT ETHILON 3 0 PS 1 (SUTURE) IMPLANT
SUT FIBERWIRE #2 38 T-5 BLUE (SUTURE)
SUTURE FIBERWR #2 38 T-5 BLUE (SUTURE) IMPLANT
SYR CONTROL 10ML LL (SYRINGE) ×3 IMPLANT
TAPE CLOTH SURG 6X10 WHT LF (GAUZE/BANDAGES/DRESSINGS) ×3 IMPLANT
TOWEL OR 17X24 6PK STRL BLUE (TOWEL DISPOSABLE) ×3 IMPLANT
TOWEL OR 17X26 10 PK STRL BLUE (TOWEL DISPOSABLE) ×3 IMPLANT
TRAY FOLEY CATH 16FRSI W/METER (SET/KITS/TRAYS/PACK) ×3 IMPLANT
TUBE CONNECTING 12'X1/4 (SUCTIONS) ×1
TUBE CONNECTING 12X1/4 (SUCTIONS) ×2 IMPLANT
TUBING ARTHROSCOPY IRRIG 16FT (MISCELLANEOUS) ×3 IMPLANT
WAND HAND CNTRL MULTIVAC 90 (MISCELLANEOUS) ×3 IMPLANT
WATER STERILE IRR 1000ML POUR (IV SOLUTION) ×3 IMPLANT

## 2015-05-10 NOTE — Progress Notes (Signed)
Pt awake in PACU, VSS, strong grip, head lift, 550cc TV.  Suctioned oropharynx and extubated to [place] O2.  VSS, resp even and unlabored.  Pt understands her resp issue pre-op was related to hemidiaphragm weakness, which has now resolved.  Sandford Craze, MD

## 2015-05-10 NOTE — Transfer of Care (Signed)
Immediate Anesthesia Transfer of Care Note  Patient: [name]  Procedure(s) Performed: Procedure(s) with comments: RIGHT SHOULDER ARTHROSCOPY WITH BICEPS TENOTOMY, DEBRIDEMENT LABRAL TEAR (Right) - Right shoulder arthroscopy biceps tenotomy, debridement labral tear  Patient Location: PACU  Anesthesia Type:GA combined with regional for post-op pain  Level of Consciousness: sedated and Patient remains intubated per anesthesia plan  Airway & Oxygen Therapy: Patient remains intubated per anesthesia plan and Patient placed on Ventilator (see vital sign flow sheet for setting)  Post-op Assessment: Report given to RN and Post -op Vital signs reviewed and stable  Post vital signs: Reviewed and stable  Last Vitals:  Filed Vitals:   05/10/15 1048  BP:   Pulse:   Temp:   Resp: 19    Complications: respiratory complications

## 2015-05-10 NOTE — Anesthesia Postprocedure Evaluation (Signed)
  Anesthesia Post-op Note  Patient: [name]  Procedure(s) Performed: Procedure(s) with comments: RIGHT SHOULDER ARTHROSCOPY WITH BICEPS TENOTOMY, DEBRIDEMENT LABRAL TEAR (Right) - Right shoulder arthroscopy biceps tenotomy, debridement labral tear  Patient Location: PACU  Anesthesia Type:GA combined with regional for post-op pain  Level of Consciousness: awake, alert , oriented and patient cooperative  Airway and Oxygen Therapy: Patient Spontanous Breathing and Patient connected to nasal cannula oxygen  Post-op Pain: none  Post-op Assessment: Post-op Vital signs reviewed, Patient's Cardiovascular Status Stable, Respiratory Function Stable, Patent Airway, No signs of Nausea or vomiting and Pain level controlled              Post-op Vital Signs: Reviewed and stable  Last Vitals:  Filed Vitals:   05/10/15 1339  BP: 140/75  Pulse: 71  Temp:   Resp: 18    Complications: unexpected post-op hospitalization, pt extubated now that 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 Will watch overnight, given severity of her resp distress preop, and her complex history of cardiomyopathy, CRF, obesity. Discussed with patient, sig other, Dr. Ave Filter

## 2015-05-10 NOTE — Anesthesia Preprocedure Evaluation (Addendum)
Anesthesia Evaluation  Patient identified by MRN, date of birth, ID band Patient awake    Reviewed: Allergy & Precautions, NPO status , Patient's Chart, lab work & pertinent test results, reviewed documented beta blocker date and time   History of Anesthesia Complications Negative for: history of anesthetic complications  Airway Mallampati: II  TM Distance: >3 FB Neck ROM: Full    Dental  (+) Dental Advisory Given   Pulmonary shortness of breath, asthma , COPD COPD inhaler,  breath sounds clear to auscultation        Cardiovascular hypertension, Pt. on medications and Pt. on home beta blockers - anginaRhythm:Regular Rate:Normal  05/07/15 ECHO: Systolic function moderately reduced. The estimated ejection fraction 35% to 40%. 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)     Musculoskeletal  (+) Arthritis -,   Abdominal (+) + obese,   Peds  Hematology  (+) Blood dyscrasia (Hb 9.7), ,   Anesthesia Other Findings   Reproductive/Obstetrics 05/07/15 preg test: NEG                         Anesthesia Physical Anesthesia Plan  ASA: III  Anesthesia Plan: General   Post-op Pain Management: GA combined w/ Regional for post-op pain   Induction: Intravenous  Airway Management Planned: Oral ETT  Additional Equipment: CVP  Intra-op Plan:   Post-operative Plan: Extubation in OR  Informed Consent: I have reviewed the patients History and Physical, chart, labs and discussed the procedure including the risks, benefits and alternatives for the proposed anesthesia with the patient or authorized representative who has indicated his/her understanding and acceptance.   Dental advisory given  Plan Discussed with: CRNA and Surgeon  Anesthesia Plan Comments: (Plan routine  monitors, central line for access, GETA with interscalene block for post op analgesia)        Anesthesia Quick Evaluation

## 2015-05-10 NOTE — Anesthesia Procedure Notes (Addendum)
Anesthesia Regional Block:  Interscalene brachial plexus block  Pre-Anesthetic Checklist: ,, timeout performed, Correct Patient, Correct Site, Correct Laterality, Correct Procedure, Correct Position, site marked, Risks and benefits discussed,  Surgical consent,  Pre-op evaluation,  At surgeon's request and post-op pain management  Laterality: Right and Upper  Prep: chloraprep       Needles:  Injection technique: Single-shot     Needle Length: 5cm 5 cm Needle Gauge: 22 and 22 G    Additional Needles:  Procedures: nerve stimulator Interscalene brachial plexus block  Nerve Stimulator or Paresthesia:  Response: forearm twitch, 0.45 mA, 0.1 ms,   Additional Responses:   Narrative:  Start time: 05/10/2015 10:09 AM End time: 05/10/2015 10:13 AM Injection made incrementally with aspirations every 5 mL.  Performed by: Personally  Anesthesiologist: Glennon Mac, C[facility]  Additional Notes: Pt identified in Holding room.  Monitors applied. Working IV access confirmed. Sterile prep R neck.  #22ga PNS to forearm twitch at 0.66m threshold.  30cc 0.5% Bupivacaine with 1:200k epi injected incrementally after negative test dose.  Patient asymptomatic, VSS, no heme aspirated, tolerated well.    CJenita Seashore MD     Procedure Name: Intubation Date/Time: 05/10/2015 10:32 AM Performed by: FJacquiline DoeA Pre-anesthesia Checklist: Patient identified, Emergency Drugs available, Suction available, Patient being monitored and Timeout performed Oxygen Delivery Method: Ambu bag Preoxygenation: Pre-oxygenation with 100% oxygen Intubation Type: IV induction, Rapid sequence and Cricoid Pressure applied Laryngoscope Size: Mac and 4 Grade View: Grade II Tube type: Oral Tube size: 7.5 mm Number of attempts: 1 Airway Equipment and Method: Stylet Placement Confirmation: ETT inserted through vocal cords under direct vision,  CO2 detector and breath sounds checked- equal and bilateral Secured at: 24 cm Tube  secured with: Tape Dental Injury: Teeth and Oropharynx as per pre-operative assessment  Comments: Intubation per Dr. JGlennon Mac.    Anesthesia Procedure Note CVP: Timeout, sterile prep, drape, FBP L neck.  Trendelenburg position.  1% lido local, finder and trocar LIJ 1st pass with UKoreaguidance.  2 lumen placed over J wire. Biopatch and sterile dressing on.  Patient tolerated well.  VSS.  CJenita Seashore MD  09:55-10:09

## 2015-05-10 NOTE — Progress Notes (Signed)
Fentenyl and Versed 2 mg pulled out of PIXIX was handed over to [employer] for use durning nerve block.

## 2015-05-10 NOTE — OR Nursing (Addendum)
Patient came in the OR with endotracheal tube  from holding area @ 1051 am

## 2015-05-10 NOTE — Op Note (Signed)
Procedure(s):  Procedure Note  [name] female 34 y.o. 05/10/2015  Procedure(s) and Anesthesia Type:    #1 right shoulder arthroscopic debridement extensive SLAP tear, anterior and posterior labral tears with biceps tenotomy    #2 right shoulder arthroscopic bursectomy and subacromial decompression  Surgeon(s) and Role:    * Jones Broom, MD - Primary     Surgeon: Mable Paris   Assistants: Damita Lack PA-C (Danielle was present and scrubbed throughout the procedure and was essential in positioning, assisting with the camera and instrumentation,, and closure)  Anesthesia: General endotracheal anesthesia    Procedure Detail    Estimated Blood Loss: Min         Drains: none  Blood Given: none         Specimens: none        Complications:  * No complications entered in OR log *         Disposition: PACU - hemodynamically stable.         Condition: stable    Procedure:   INDICATIONS FOR SURGERY: The patient is 34 y.o. female who has had a long history of right shoulder pain. She has had dislocation events in the past. She no longer has any instability but has chronic pain crepitance in the shoulder. She failed conservative management with activity modification, medications, injections and wished to go forward with surgical management. She was found on MRI to have extensive labral tear involving the biceps anchor. Ultimately she was to go forward with surgical treatment to try and decrease pain and restore function. She understood risks benefits and alternatives to surgery.  OPERATIVE FINDINGS: Examination under anesthesia: No significant stiffness or instability.   DESCRIPTION OF PROCEDURE: The patient was identified in preoperative  holding area where I personally marked the operative site after  verifying site, side, and procedure with the patient. An interscalene block was given by the attending anesthesiologist the holding area.  She also had a  central line placed due to difficult venous access. While she was in the holding area she was noted to have significant increased respiratory distress and required intubation in the holding area. After discussion with the attending anesthesiologist Jairo Ben it was felt that this was likely due to partial paralysis of the hemidiaphragm. Chest x-ray was done which showed only mild pulmonary edema. She felt there is no other concerning signs and felt that surgery could be safely performed. Given the fact that she had already undergone interscalene block and intubated for surgery I felt that it was best to proceed with the surgery at this time and get her shoulder better, rather than canceling and rescheduling.  The patient was taken back to the operating room where she was placed in the beach-chair position with the back  elevated about 60 degrees and all extremities and head and neck carefully padded and  positioned. She did have a Foley catheter placed for fluid management.  The right upper extremity was then prepped and  draped in a standard sterile fashion. The appropriate time-out  procedure was carried out. The patient did receive IV antibiotics  within 30 minutes of incision.   A small posterior portal incision was made and the arthroscope was introduced into the joint. An anterior portal was then established above the subscapularis using needle localization. Small cannula was placed anteriorly. Diagnostic arthroscopy was then carried out.  She was noted to have a moderate amount of synovitis in the joint. Subscapularis was intact. She was noted have extensive  tearing of the circumferential labrum with complete detachment of the biceps anchor subluxating into the joint. She had a large flap tears of anterior and posterior labrum as well. The biceps tendon was pulled into the joint and was noted to have severe tenosynovitis. Collene Mares was used to extensively debride the labral tears. Combination  of ArthroCare and large biter were then used to release the biceps tendon off of the superior labrum. This was allowed to retract out of the joint. The shaver was then again used to debride the superior labrum such that none was subluxing into the joint. Glenohumeral joint surfaces were intact without significant chondromalacia. The supraspinatus was intact. The camera was moved to an anterior portal and shaver through the posterior portal to more extensively debride the posterior labrum. Once the significant degenerative flap tears were debrided posteriorly. The remaining posterior labrum was intact and there is no need for formal repair.  The arthroscope was then introduced into the subacromial space a standard lateral portal was established with needle localization. The shaver was used through the lateral portal to perform extensive bursectomy. She was noted to have a fairly significant hypertrophied bursitis. The bursal surface rotator cuff was carefully examined and found to be completely intact. Coracoacromial ligament was examined and found to be frayed indicating impingement.  The coracoacromial ligament was taken down off the anterior acromion with the ArthroCare exposing a small downsloping anterior acromial spur. A high-speed bur was then used through the lateral portal to take down the anterior acromial spur from lateral to medial in a standard acromioplasty.  The acromioplasty was also viewed from the lateral portal and the bur was used as necessary to ensure that the acromion was completely flat from posterior to anterior.  The arthroscopic equipment was removed from the joint and the portals were closed with 3-0 nylon in an interrupted fashion. Sterile dressings were then applied including Xeroform 4 x 4's ABDs and tape. The patient was then allowed to awaken from general anesthesia, placed in a sling, transferred to the stretcher and taken to the recovery room in stable  condition.   POSTOPERATIVE PLAN: She will be transferred to the recovery room and observed carefully. It will be up to the anesthesia team whether she is acutely extubated are observed on the ventilator. She will be observed overnight in a stepdown unit. Critical care will be involved. Once she is discharged she can follow-up in my office in one week at which point we'll get her into physical therapy for her shoulder.

## 2015-05-10 NOTE — H&P (Signed)
[name] is an 34 y.o. female.   Chief Complaint: R shoulder pain HPI: R chronic shoulder pain with labral tears, SLAP tear, failed nonop tx.   Past Medical History  Diagnosis Date  . Hypertension   . Asthma   . Hyperlipidemia   . Pneumonia     "couple times; have it now" (09/15/2013)  . Chronic bronchitis     "just about q yr" (09/15/2013)  . Shortness of breath     "just recently; related to the pneumonia" (09/15/2013)  . Anemia   . Migraine     "get them alot" (09/15/2013)  . Arthritis     "left hand" (09/15/2013)  . Chronic kidney disease     "low kidney function" (09/15/2013)  . COPD (chronic obstructive pulmonary disease)   . Normal coronary arteries     by cardiac catheterization 09/20/13  . Type 1 diabetes mellitus     Past Surgical History  Procedure Laterality Date  . Cesarean section  1999; 2006  . 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 Tonsillectomy  1997  . Tubal ligation  2006  . Coronary angiogram  09/20/2013    Procedure: CORONARY ANGIOGRAM;  Surgeon: Runell Gess, MD;  Location: [facility] CATH LAB;  Service: Cardiovascular;;  . Av fistula placement Left 03/27/2015    Procedure: CREATION RADIAL CEPHALIC ARTERIOVENOUS FISTULA;  Surgeon: Chuck Hint, MD;  Location: [facility] OR;  Service: Vascular;  Laterality: Left;    Family History  Problem Relation Age of Onset  . Diabetes Mother   . Stroke Mother   . Diabetes Maternal Grandmother   . Cancer Paternal Grandmother   . Hypertension Father   . Hyperlipidemia Father   . Cancer - Lung Father    Social History:  reports that she has never smoked. She has never used smokeless tobacco. She reports that she does not drink alcohol or use illicit drugs.  Allergies:  Allergies  Allergen Reactions  . Aspirin Anaphylaxis  . Sulfur Hives  . Tramadol Other (See Comments)    Pt states she feels weird     Medications Prior to Admission  Medication Sig  Dispense Refill  . atorvastatin (LIPITOR) 40 MG tablet Take 1 tablet (40 mg total) by mouth daily. (Patient taking differently: Take 40 mg by mouth at bedtime. ) 30 tablet 3  . calcitRIOL (ROCALTROL) 0.25 MCG capsule Take 0.25 mcg by mouth daily.    . carvedilol (COREG) 25 MG tablet 1 tablet PO BID. (Patient taking differently: Take 25 mg by mouth 2 (two) times daily with a meal. ) 60 tablet 0  . diphenhydrAMINE (BENADRYL) 25 MG tablet Take 50 mg by mouth at bedtime as needed for allergies.    . furosemide (LASIX) 80 MG tablet Take 160 mg by mouth 2 (two) times daily.     . hydrALAZINE (APRESOLINE) 25 MG tablet Take 1.5 tablets (37.5 mg total) by mouth every 8 (eight) hours. 120 tablet 0  . insulin aspart (NOVOLOG FLEXPEN) 100 UNIT/ML FlexPen Inject 12-20 Units into the skin 3 (three) times daily with meals. Per sliding scale - based on carb count and CBG    . Insulin Glargine (LANTUS SOLOSTAR) 100 UNIT/ML Solostar Pen Inject 20 Units into the skin at bedtime.    . isosorbide dinitrate (ISORDIL) 20 MG tablet TAKE 1 TABLET BY MOUTH THREE TIMES DAILY (Patient taking differently: Take 20 mg by mouth 3 (three) times daily. ) 270  tablet 0  . oxyCODONE (ROXICODONE) 5 MG immediate release tablet Take 1 tablet (5 mg total) by mouth every 6 (six) hours as needed for severe pain. 20 tablet 0  . albuterol (PROVENTIL HFA;VENTOLIN HFA) 108 (90 BASE) MCG/ACT inhaler Inhale 2 puffs into the lungs every 6 (six) hours as needed for wheezing or shortness of breath.     Marland Kitchen albuterol (PROVENTIL) (2.5 MG/3ML) 0.083% nebulizer solution Take 2.5 mg by nebulization every 6 (six) hours as needed for wheezing or shortness of breath.    . budesonide (PULMICORT) 0.25 MG/2ML nebulizer solution Take 2 mLs (0.25 mg total) by nebulization 2 (two) times daily. (Patient taking differently: Take 0.25 mg by nebulization 2 (two) times daily as needed (wheezing/shortness of breath). ) 60 mL 12    Results for orders placed or performed  during the hospital encounter of 05/10/15 (from the past 48 hour(s))  Glucose, capillary     Status: Abnormal   Collection Time: 05/10/15  9:23 AM  Result Value Ref Range   Glucose-Capillary 140 (H) 65 - 99 mg/dL  I-STAT 4, (NA,K, GLUC, HGB,HCT)     Status: Abnormal   Collection Time: 05/10/15  9:36 AM  Result Value Ref Range   Sodium 137 135 - 145 mmol/L   Potassium 4.3 3.5 - 5.1 mmol/L   Glucose, Bld 144 (H) 65 - 99 mg/dL   HCT 40.9 (L) 81.1 - 91.4 %   Hemoglobin 9.2 (L) 12.0 - 15.0 g/dL   No results found.  Review of Systems  All other systems reviewed and are negative.   Blood pressure 162/94, pulse 94, temperature 97.8 F (36.6 C), temperature source Oral, resp. rate 18, height  (1.727 m), weight 152.862 kg (337 lb), SpO2 99 %. Physical Exam  Constitutional: She is oriented to person, place, and time. She appears well-developed and well-nourished.  HENT:  Head: Atraumatic.  Eyes: EOM are normal.  Cardiovascular: Intact distal pulses.   Respiratory: Effort normal.  Musculoskeletal:  R shoulder pain with ROM, NVID  Neurological: She is alert and oriented to person, place, and time.  Skin: Skin is warm and dry.  Psychiatric: She has a normal mood and affect.     Assessment/Plan R shoulder labral tears, SLAP tear Plan R arth debridement, tenotomy Risks / benefits of surgery discussed Consent on chart  NPO for OR Preop antibiotics    Bionca Mckey WILLIAM 05/10/2015, 9:44 AM

## 2015-05-10 NOTE — Progress Notes (Signed)
Paged MD about sliding scale insulin. Pt takes a Flex pen at home. Awaiting for orders

## 2015-05-11 ENCOUNTER — Other Ambulatory Visit: Payer: Medicaid Other

## 2015-05-11 ENCOUNTER — Encounter (HOSPITAL_COMMUNITY): Payer: Self-pay | Admitting: Orthopedic Surgery

## 2015-05-11 DIAGNOSIS — I120 Hypertensive chronic kidney disease with stage 5 chronic kidney disease or end stage renal disease: Secondary | ICD-10-CM | POA: Diagnosis present

## 2015-05-11 DIAGNOSIS — Z79899 Other long term (current) drug therapy: Secondary | ICD-10-CM | POA: Diagnosis not present

## 2015-05-11 DIAGNOSIS — J449 Chronic obstructive pulmonary disease, unspecified: Secondary | ICD-10-CM | POA: Diagnosis present

## 2015-05-11 DIAGNOSIS — E1122 Type 2 diabetes mellitus with diabetic chronic kidney disease: Secondary | ICD-10-CM | POA: Diagnosis present

## 2015-05-11 DIAGNOSIS — Z794 Long term (current) use of insulin: Secondary | ICD-10-CM | POA: Diagnosis not present

## 2015-05-11 DIAGNOSIS — Z9889 Other specified postprocedural states: Secondary | ICD-10-CM | POA: Diagnosis not present

## 2015-05-11 DIAGNOSIS — J9691 Respiratory failure, unspecified with hypoxia: Secondary | ICD-10-CM | POA: Diagnosis not present

## 2015-05-11 DIAGNOSIS — X58XXXA Exposure to other specified factors, initial encounter: Secondary | ICD-10-CM | POA: Diagnosis present

## 2015-05-11 DIAGNOSIS — I214 Non-ST elevation (NSTEMI) myocardial infarction: Secondary | ICD-10-CM | POA: Diagnosis present

## 2015-05-11 DIAGNOSIS — E1165 Type 2 diabetes mellitus with hyperglycemia: Secondary | ICD-10-CM | POA: Diagnosis present

## 2015-05-11 DIAGNOSIS — M659 Synovitis and tenosynovitis, unspecified: Secondary | ICD-10-CM | POA: Diagnosis present

## 2015-05-11 DIAGNOSIS — R0789 Other chest pain: Secondary | ICD-10-CM

## 2015-05-11 DIAGNOSIS — I420 Dilated cardiomyopathy: Secondary | ICD-10-CM

## 2015-05-11 DIAGNOSIS — I5041 Acute combined systolic (congestive) and diastolic (congestive) heart failure: Secondary | ICD-10-CM | POA: Diagnosis not present

## 2015-05-11 DIAGNOSIS — I429 Cardiomyopathy, unspecified: Secondary | ICD-10-CM | POA: Diagnosis not present

## 2015-05-11 DIAGNOSIS — N185 Chronic kidney disease, stage 5: Secondary | ICD-10-CM | POA: Diagnosis not present

## 2015-05-11 DIAGNOSIS — Z6841 Body Mass Index (BMI) 40.0 and over, adult: Secondary | ICD-10-CM | POA: Diagnosis not present

## 2015-05-11 DIAGNOSIS — I272 Other secondary pulmonary hypertension: Secondary | ICD-10-CM | POA: Diagnosis present

## 2015-05-11 DIAGNOSIS — E785 Hyperlipidemia, unspecified: Secondary | ICD-10-CM | POA: Diagnosis present

## 2015-05-11 DIAGNOSIS — S43431A Superior glenoid labrum lesion of right shoulder, initial encounter: Secondary | ICD-10-CM | POA: Diagnosis present

## 2015-05-11 DIAGNOSIS — I2510 Atherosclerotic heart disease of native coronary artery without angina pectoris: Secondary | ICD-10-CM | POA: Diagnosis present

## 2015-05-11 DIAGNOSIS — M25511 Pain in right shoulder: Secondary | ICD-10-CM | POA: Diagnosis present

## 2015-05-11 LAB — BASIC METABOLIC PANEL
Anion gap: 13 (ref 5–15)
BUN: 60 mg/dL — ABNORMAL HIGH (ref 6–20)
CO2: 18 mmol/L — ABNORMAL LOW (ref 22–32)
Calcium: 7.4 mg/dL — ABNORMAL LOW (ref 8.9–10.3)
Chloride: 105 mmol/L (ref 101–111)
Creatinine, Ser: 6.18 mg/dL — ABNORMAL HIGH (ref 0.44–1.00)
GFR calc Af Amer: 9 mL/min — ABNORMAL LOW (ref >60)
GFR, EST NON AFRICAN AMERICAN: 8 mL/min — AB (ref >60)
GLUCOSE: 116 mg/dL — AB (ref 65–99)
POTASSIUM: 4.4 mmol/L (ref 3.5–5.1)
SODIUM: 136 mmol/L (ref 135–145)

## 2015-05-11 LAB — TROPONIN I
TROPONIN I: 0.53 ng/mL — AB (ref <0.031)
Troponin I: 5.42 ng/mL (ref <0.031)
Troponin I: 11.22 ng/mL (ref <0.031)

## 2015-05-11 LAB — GLUCOSE, CAPILLARY
GLUCOSE-CAPILLARY: 145 mg/dL — AB (ref 65–99)
Glucose-Capillary: 178 mg/dL — ABNORMAL HIGH (ref 65–99)
Glucose-Capillary: 138 mg/dL — ABNORMAL HIGH (ref 65–99)
Glucose-Capillary: 107 mg/dL — ABNORMAL HIGH (ref 65–99)

## 2015-05-11 MED ORDER — NITROGLYCERIN 2 % TD OINT
1.0000 [in_us] | TOPICAL_OINTMENT | Freq: Four times a day (QID) | TRANSDERMAL | Status: DC
  Filled 2015-05-11: qty 30

## 2015-05-11 MED ORDER — NITROGLYCERIN 2 % TD OINT
1.0000 [in_us] | TOPICAL_OINTMENT | Freq: Four times a day (QID) | TRANSDERMAL | Status: DC
  Administered 2015-05-11 – 2015-05-12 (×6): 1 [in_us] via TOPICAL

## 2015-05-11 MED ORDER — ISOSORBIDE DINITRATE 20 MG PO TABS
40.0000 mg | ORAL_TABLET | Freq: Three times a day (TID) | ORAL | Status: DC
  Administered 2015-05-11 – 2015-05-13 (×8): 40 mg via ORAL
  Filled 2015-05-11 (×9): qty 2

## 2015-05-11 MED ORDER — MORPHINE SULFATE 4 MG/ML IJ SOLN
4.0000 mg | Freq: Once | INTRAMUSCULAR | Status: AC
  Administered 2015-05-11: 4 mg via INTRAVENOUS
  Filled 2015-05-11: qty 1

## 2015-05-11 MED ORDER — HEPARIN (PORCINE) IN NACL 100-0.45 UNIT/ML-% IJ SOLN
2300.0000 [IU]/h | INTRAMUSCULAR | Status: DC
  Administered 2015-05-11: 1500 [IU]/h via INTRAVENOUS
  Administered 2015-05-12: 2250 [IU]/h via INTRAVENOUS
  Administered 2015-05-12: 1900 [IU]/h via INTRAVENOUS
  Administered 2015-05-13: 2300 [IU]/h via INTRAVENOUS
  Filled 2015-05-11 (×6): qty 250

## 2015-05-11 MED ORDER — NITROGLYCERIN 0.4 MG SL SUBL
SUBLINGUAL_TABLET | SUBLINGUAL | Status: AC
  Administered 2015-05-11: 0.4 mg
  Filled 2015-05-11: qty 1

## 2015-05-11 MED ORDER — FUROSEMIDE 10 MG/ML IJ SOLN
120.0000 mg | Freq: Once | INTRAVENOUS | Status: AC
  Administered 2015-05-11: 120 mg via INTRAVENOUS
  Filled 2015-05-11: qty 12

## 2015-05-11 MED ORDER — HYDRALAZINE HCL 25 MG PO TABS
37.5000 mg | ORAL_TABLET | Freq: Once | ORAL | Status: AC
  Administered 2015-05-11: 37.5 mg via ORAL
  Filled 2015-05-11: qty 2

## 2015-05-11 MED ORDER — HYDRALAZINE HCL 50 MG PO TABS
50.0000 mg | ORAL_TABLET | Freq: Three times a day (TID) | ORAL | Status: DC
  Administered 2015-05-11 – 2015-05-13 (×7): 50 mg via ORAL
  Filled 2015-05-11 (×8): qty 1

## 2015-05-11 MED ORDER — LABETALOL HCL 5 MG/ML IV SOLN
20.0000 mg | Freq: Once | INTRAVENOUS | Status: AC
  Administered 2015-05-11: 20 mg via INTRAVENOUS
  Filled 2015-05-11: qty 4

## 2015-05-11 MED ORDER — OXYCODONE-ACETAMINOPHEN 5-325 MG PO TABS: 1.0000 | ORAL_TABLET | ORAL | Status: DC | PRN

## 2015-05-11 MED FILL — Medication: Qty: 1 | Status: AC

## 2015-05-11 NOTE — Progress Notes (Addendum)
Received call from [employer]. Pt had a 7 beat run of V-Tach. Paged Cardiology PA to make them aware, no new orders given at this time.  Will continue to monitor

## 2015-05-11 NOTE — Progress Notes (Signed)
ANTICOAGULATION CONSULT NOTE - Initial Consult  Pharmacy Consult for Heparin Indication: chest pain/ACS  Allergies  Allergen Reactions  . Aspirin Anaphylaxis  . Sulfur Hives  . Tramadol Other (See Comments)    Pt states she feels weird    Patient Measurements: Height: 5\' 8"  (172.7 cm) Weight: (!) 337 lb (152.862 kg) IBW/kg (Calculated) : 63.9 Heparin Dosing Weight: 101.8kg  Vital Signs: Temp: 98.1 F (36.7 C) (08/12 1300) BP: 155/88 mmHg (08/12 1524) Pulse Rate: 89 (08/12 1300)  Labs:  Recent Labs  05/10/15 0936 05/11/15 0743 05/11/15 1323  HGB 9.2*  --   --   HCT 27.0*  --   --   TROPONINI  --  0.53* 5.42*   Estimated Creatinine Clearance: 21.2 mL/min (by C-G formula based on Cr of 5.83).  Medical History: Past Medical History  Diagnosis Date  . Hypertension   . Asthma   . Hyperlipidemia   . Pneumonia     "couple times; have it now" (09/15/2013)  . Chronic bronchitis     "just about q yr" (09/15/2013)  . Shortness of breath     "just recently; related to the pneumonia" (09/15/2013)  . Anemia   . Migraine     "get them alot" (09/15/2013)  . Arthritis     "left hand" (09/15/2013)  . Chronic kidney disease     "low kidney function" (09/15/2013)  . COPD (chronic obstructive pulmonary disease)   . Normal coronary arteries     by cardiac catheterization 09/20/13  . Type 1 diabetes mellitus    Medications:  Infusions:  . sodium chloride 100 mL/hr at 05/10/15 2238    Assessment: 34 yo female now with chest pain and an elevated troponin.  She recently had surgery and is at risk for bleeding and we have been asked to start IV heparin without a bolus.  Her last CBC was on 8/8 and she had a slight drop in H/H (9.7/30.2) and her platelets were wnl at 227K.  She is currently without noted bleeding complications.  She is morbidly obese and we will use a weight adjusted approach for her therapy of 101kg.  Goal of Therapy:  Heparin level 0.3-0.7  units/ml Monitor platelets by anticoagulation protocol: Yes   Plan:  - Begin IV heparin at 1500 units/hr - Check Heparin level in 6 hours and then CBC and Heparin level daily - Monitor for bleeding complications  Nadara Mustard, PharmD., MS Clinical Pharmacist Pager:  [phone] Thank you for allowing pharmacy to be part of this patients care team. 05/11/2015,3:37 PM

## 2015-05-11 NOTE — Plan of Care (Signed)
Problem: Consults Goal: Diagnosis - Shoulder Surgery Outcome: Completed/Met Date Met:  05/11/15 Total Shoulder Arthroplasty Right

## 2015-05-11 NOTE — Progress Notes (Signed)
CRITICAL VALUE ALERT  Critical value received:  Troponin 0.53  Date of notification:  05/11/15  Time of notification:  0905  Critical value read back:Yes.    Nurse who received alert:  Caprice Beaver    MD notified (1st page):  Bishop Limbo  Time of first page:  0915  MD notified (2nd page):  Time of second page:  Responding MD:  PA for md kelly,   Time MD responded:  0930

## 2015-05-11 NOTE — Discharge Instructions (Signed)
Discharge Instructions after Arthroscopic Shoulder Surgery   A sling has been provided for you. You may remove the sling after 72 hours. The sling may be worn for your protection, if you are in a crowd.  Use ice on the shoulder intermittently over the first 48 hours after surgery.  Pain medication has been prescribed for you.  Use your medication liberally over the first 48 hours, and then begin to taper your use. You may take Extra Strength Tylenol or Tylenol only in place of the pain pills. DO NOT take ANY nonsteroidal anti-inflammatory pain medications: Advil, Motrin, Ibuprofen, Aleve, Naproxen, or Naprosyn.  You may remove your dressing after two days.  You may shower 5 days after surgery. The incision CANNOT get wet prior to 5 days. Simply allow the water to wash over the site and then pat dry. Do not rub the incision. Make sure your axilla (armpit) is completely dry after showering.  Take one aspirin a day for 2 weeks after surgery, unless you have an aspirin sensitivity/allergy or asthma.  Three to 5 times each day you should perform assisted overhead reaching and external rotation (outward turning) exercises with the operative arm. Both exercises should be done with the non-operative arm used as the "therapist arm" while the operative arm remains relaxed. Ten of each exercise should be done three to five times each day.    Overhead reach is helping to lift your stiff arm up as high as it will go. To stretch your overhead reach, lie flat on your back, relax, and grasp the wrist of the tight shoulder with your opposite hand. Using the power in your opposite arm, bring the stiff arm up as far as it is comfortable. Start holding it for ten seconds and then work up to where you can hold it for a count of 30. Breathe slowly and deeply while the arm is moved. Repeat this stretch ten times, trying to help the arm up a little higher each time.       External rotation is turning the arm out to  the side while your elbow stays close to your body. External rotation is best stretched while you are lying on your back. Hold a cane, yardstick, broom handle, or dowel in both hands. Bend both elbows to a right angle. Use steady, gentle force from your normal arm to rotate the hand of the stiff shoulder out away from your body. Continue the rotation as far as it will go comfortably, holding it there for a count of 10. Repeat this exercise ten times.     Please call [phone] during normal business hours or [phone] after hours for any problems. Including the following:  - excessive redness of the incisions - drainage for more than 4 days - fever of more than 101.5 F  *Please note that pain medications will not be refilled after hours or on weekends.    

## 2015-05-11 NOTE — Progress Notes (Addendum)
Received call from [employer] that the patient had an 8 beat run of V tach. Paged Cardiology. Pt is alert and VSS. No complaints of chest pain. Will continue to monitor

## 2015-05-11 NOTE — Progress Notes (Signed)
Called Cardiology PA to inform him of the troponin level of 5.42. PA stated that he was going to contact Dr. Ave Filter for possible intervention. No new orders at this time. Will continue to monitor

## 2015-05-11 NOTE — Consult Note (Signed)
CARDIOLOGY CONSULT NOTE   Patient ID: [name] MRN: [id number], DOB/AGE: 36/36/1982   Admit date: 05/10/2015 Date of Consult: 05/11/2015   Primary Physician: [name], FNP Primary Cardiologist: Nanetta Batty, MD  Pt. Profile  34 year old morbidly obese female with hypertension, hyperlipidemia, diabetes mellitus type 1, asthma, anemia, combined systolic and diastolic congestive heart failure (EF of 35-40% with grade 2 diastolic dysfunction by 2-D echo on 05/07/15), pulmonary HTN (RVSP )  CKD-IV, who was admitted due to right shoulder labral tears that failed medical management.  Cardiology is consulted for chest pain and hypertension.   Problem List  Past Medical History  Diagnosis Date  . Hypertension   . Asthma   . Hyperlipidemia   . Pneumonia     "couple times; have it now" (09/15/2013)  . Chronic bronchitis     "just about q yr" (09/15/2013)  . Shortness of breath     "just recently; related to the pneumonia" (09/15/2013)  . Anemia   . Migraine     "get them alot" (09/15/2013)  . Arthritis     "left hand" (09/15/2013)  . Chronic kidney disease     "low kidney function" (09/15/2013)  . COPD (chronic obstructive pulmonary disease)   . Normal coronary arteries     by cardiac catheterization 09/20/13  . Type 1 diabetes mellitus     Past Surgical History  Procedure Laterality Date  . Cesarean section  1999; 2006  . 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 Tonsillectomy  1997  . Tubal ligation  2006  . Coronary angiogram  09/20/2013    Procedure: CORONARY ANGIOGRAM;  Surgeon: Runell Gess, MD;  Location: [facility] CATH LAB;  Service: Cardiovascular;;  . Av fistula placement Left 03/27/2015    Procedure: CREATION RADIAL CEPHALIC ARTERIOVENOUS FISTULA;  Surgeon: Chuck Hint, MD;  Location: [facility] OR;  Service: Vascular;  Laterality: Left;     Allergies  Allergies  Allergen Reactions  . Aspirin  Anaphylaxis  . Sulfur Hives  . Tramadol Other (See Comments)    Pt states she feels weird     HPI  34 year old morbidly obese female with hypertension, hyperlipidemia, diabetes mellitus type 1, asthma, anemia, combined systolic and diastolic congestive heart failure (EF of 35-40% with grade 2 diastolic dysfunction by 2-D echo on 05/07/15), pulmonary HTN (RVSP )  CKD-IV, who was admitted due to right shoulder labral tears that failed medical management.  Cardiology is consulted for chest pain and hypertension.   On 05/10/15, she underwent right shoulder arthroscopic debridement extensive SLAP tear, anterior and posterior labral tears with biceps tenototomy and right shoulder arthroscopic bursectomy and subacromial decompression with Dr. Ave Filter. Minimal blood loss. 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   At around 1:15am, [name] developed 8/10 CP which she described as a central chest "tightness."  BP 172/115, P 110. She noted difficulty taking a deep breath but did not feel dyspneic per se. No other associated symptoms. Symptoms are better with sitting up; no change with inspiration, or palpation. She did have an episode of emesis around 10:30pm. ECG was obtained at 1:18am and it demonstrated ST. STD and biphasic TW in inferior and anterolateral leads. Unchanged compared to 05/04/15  On my exam, she was having ongoing chest tightness. She was given a total of 3  SL NTG with improvement in her CP to 5/10. BP remained elevated.   She denies missing any BP meds today.   Inpatient Medications  . atorvastatin  40 mg Oral QHS  . carvedilol  25 mg Oral BID WC  .  ceFAZolin (ANCEF) IV  2 g Intravenous Q6H  . furosemide  160 mg Oral BID  . hydrALAZINE  37.5 mg Oral 3 times per day  . insulin aspart  0-20 Units Subcutaneous TID WC  . insulin glargine  20  Units Subcutaneous QHS  . isosorbide dinitrate  20 mg Oral TID  . nitroGLYCERIN      . nitroGLYCERIN        Family History Family History  Problem Relation Age of Onset  . Diabetes Mother   . Stroke Mother   . Diabetes Maternal Grandmother   . Cancer Paternal Grandmother   . Hypertension Father   . Hyperlipidemia Father   . Cancer - Lung Father      Social History Social History   Social History  . Marital Status: Single    Spouse Name: N/A  . Number of Children: 2  . Years of Education: N/A   Occupational History  . Student    Social History Main Topics  . Smoking status: Never Smoker   . Smokeless tobacco: Never Used  . Alcohol Use: No  . Drug Use: No  . Sexual Activity:    Partners: Male    Birth Control/ Protection: Other-see comments     Comment: S/P tubal ligation   Other Topics Concern  . Not on file   Social History Narrative   Single.  Moved here from [place].  Lives with 2 children.       Review of Systems  General:  No chills, fever, night sweats or weight changes.  Cardiovascular:  + chest pain. No dyspnea on exertion, edema, orthopnea, palpitations, paroxysmal nocturnal dyspnea. Dermatological: No rash, lesions/masses Respiratory: No cough, dyspnea Urologic: No hematuria, dysuria Abdominal:   + recent nausea, vomiting. No diarrhea, bright red blood per rectum, melena, or hematemesis Neurologic:  No visual changes, wkns, changes in mental status. All other systems reviewed and are otherwise negative except as noted above.  Physical Exam  Blood pressure 172/115, pulse 110, temperature 97.9 F (36.6 C), temperature source Oral, resp. rate 20, height 5\' 8"  (1.727 m), weight 152.862 kg (337 lb), SpO2 100 %.  General: Pleasant, morbidly obese, mild distress Psych: Normal affect. Neuro: Alert and oriented X 3. Moves all extremities spontaneously. HEENT: Normal  Neck: Supple without bruits or JVD. Lungs:  Resp regular and unlabored, CTA. On 2L  [place], saturating low to mid 90s Heart: RRR no s3, s4, or murmurs. Abdomen: Soft, non-tender, non-distended, BS + x 4.  Extremities: No clubbing, cyanosis or edema. DP/PT/Radials 2+ and equal bilaterally.  Distal LUE fistula with strong pulse; no thrill  Labs  No results for input(s): CKTOTAL, CKMB, TROPONINI in the last 72 hours. Lab Results  Component Value Date   WBC 4.8 05/07/2015   HGB 9.2* 05/10/2015   HCT 27.0* 05/10/2015   MCV 87.8 05/07/2015   PLT 227 05/07/2015    Recent Labs Lab 05/07/15 1432 05/10/15 0936  NA 137 137  K 4.7 4.3  CL 108  --   CO2 18*  --   BUN 55*  --   CREATININE 5.83*  --   CALCIUM 7.4*  --   GLUCOSE 172* 144*   Lab Results  Component Value Date  CHOL 274* 10/25/2013   HDL 62 10/25/2013   LDLCALC 176* 10/25/2013   TRIG 180* 10/25/2013   No results found for: DDIMER  Radiology/Studies  Dg Chest Port 1 View  05/10/2015   CLINICAL DATA:  Endotracheal tube placement  EXAM: PORTABLE CHEST - 1 VIEW  COMPARISON:  09/17/2013  FINDINGS: Cardiomegaly is noted. There is mild interstitial prominence bilaterally suspicious for pulmonary edema. No segmental infiltrate. Endotracheal tube in place with tip 1.4 cm above the carina. There is left IJ central line with tip in upper SVC. No pneumothorax.  IMPRESSION: Cardiomegaly. Endotracheal tube in place. Mild interstitial prominence bilateral suspicious for mild pulmonary edema. No segmental infiltrate   Electronically Signed   By: Natasha Mead M.D.   On: 05/10/2015 11:06     09/18/13 [facility] CLINICAL DATA: Chest pain, diabetes, hypertension  EXAM: NUCLEAR MEDICINE MYOCARDIAL PERFUSION IMAGING  NUCLEAR MEDICINE LEFT VENTRICULAR WALL MOTION ANALYSIS  NUCLEAR MEDICINE LEFT VENTRICULAR EJECTION FRACTION CALCULATION  TECHNIQUE: Standard 2day myocardial SPECT imaging was performed after resting intravenous injection of Tc-68m sestamibi. After intravenous infusion of Lexiscan (regadenoson) under  supervision of cardiology staff, sestamibiwas injected intravenously and standard myocardial SPECT imaging was performed. Quantitative gated imaging was also performed to evaluate left ventricular wall motion and estimate left ventricular ejection fraction.  Radiopharmaceutical: 30+30 mCi Tc81m sestamibiIV.  COMPARISON: None  FINDINGS: The stress SPECT images demonstrate left ventricular dilatation. There is a large area of moderately decreased activity involving the anterior wall of the left ventricle extending to involve anterolateral and anteroseptal regions.  Rest images demonstrate relatively improved anterior wall activity, no new perfusion defects.  The gated stress SPECT images demonstrate global hypokinesis. Calculated left ventricular end-diastolic volume , end-systolic volume 142 ml, ejection fraction of 31%.  IMPRESSION: 1. Anterior wall ischemia. 2. Left ventricular dilatation with global hypokinesis. 3. Left ventricular ejection fraction 31%.   05/07/15 TTE - Left ventricle: There is akinesis of the basal and mid inferior, inferoseptal, anteroseptal walls. The cavity size was mildly dilated. There was mild concentric hypertrophy. Systolic function was moderately reduced. The estimated ejection fraction was in the range of 35% to 40%. Features are consistent with a pseudonormal left ventricular filling pattern, with concomitant abnormal relaxation and increased filling pressure (grade 2 diastolic dysfunction). Doppler parameters are consistent with elevated ventricular end-diastolic filling pressure. - Aortic valve: Trileaflet; normal thickness leaflets. Transvalvular velocity was within the normal range. There was no stenosis. There was no regurgitation. - Aortic root: The aortic root was normal in size. - Mitral valve: Structurally normal valve. There was mild regurgitation. - Left atrium: The atrium was mildly dilated. -  Right ventricle: The cavity size was normal. Wall thickness was normal. Systolic function was normal. - Tricuspid valve: There was mild regurgitation. - Pulmonary arteries: Systolic pressure was severely increased. PA peak pressure: 62 mm Hg (S). - Pericardium, extracardiac: A mild pericardial effusion was identified posterior to the heart. Features were not consistent with tamponade physiology.  Impressions:  - LV endocardium is poorly visualized. There appears to be no significant difference in LVEF since the last study on 09/14/2015. Echocontrast should be considered for further evaluation. There is severe pulmonary hypertension.  ECG  05/11/15 @ 1:18. ST. STD and biphasic TW in inferior and anterolateral leads. Unchanged compared to 05/04/15  ASSESSMENT AND PLAN  34 year old morbidly obese female with hypertension, hyperlipidemia, diabetes mellitus type 1, asthma, anemia, combined systolic and diastolic congestive heart failure (EF of 35-40% with grade 2 diastolic dysfunction by  2-D echo on 05/07/15), pulmonary HTN (RVSP )  CKD-IV, who was admitted due to right shoulder labral tears that failed medical management.  Cardiology is consulted for chest pain and hypertension.   [name] has chest pain, hypoxemia, and very poorly controlled HTN in the post operative setting. Her ECG is unchanged. She has a history of CAD risk factors but no epicardial disease. I suspect that the symptoms are most likely related to her poorly controlled hypertension rather than ACS but it is impossible to know for sure at this point. She has persistent hypoxemia which may be related to her hemidiaphragm palsy but could also suggest mild CHF in setting of intra-op fluid administration and poorly controlled HTN.  At this point, will focus on BP control, pain control, and mild diuresis. Will need to cycle troponins to rule out MI.   1. 1inch nitropaste 2. 120mg  IV lasix 3. 20mg  IV labetolol 4.  No ASA due to history of anaphylaxis 5. Continue Aatorvastatin 40mg  6. Continue coreg 25mg  BID 7. Continue hydral 37.5mg  TID 8. Increase isordil from 20mg  TID to isordil 40mg  TID 9. Cycle troponins 10. 4mg  IV morphine  Signed, Glori Luis, MD 05/11/2015, 3:19 AM

## 2015-05-11 NOTE — Progress Notes (Signed)
Patient complained of chest pain and 12  Lead EKG was done. Patient BP was also elevated.  Dr. Geni Bers PA was notified. PA gave an order of Hydralazine 37.5 mg.PA had to consult with [facility] and Dr. Zachery Conch was notified to see patient.  Dr. Alphonsus Sias orderd  3 nitro pills on assessment.  Further assessment was done and Lasix and Nitro gel was ordered. Received an order to put patient on telemetry from PA. PA updated after patient was seen by Dr. Zachery Conch. Dr. Ave Filter to see patient AM

## 2015-05-11 NOTE — Progress Notes (Signed)
   PATIENT ID: [name]   1 Day Post-Op Procedure(s) (LRB): RIGHT SHOULDER ARTHROSCOPY WITH BICEPS TENOTOMY, DEBRIDEMENT LABRAL TEAR (Right)  Subjective: Patients reports right shoulder pain getting under control with IV morphine. Denies CP/SOB. Has symptoms of chest tightness last night with abnormal EKG with ST changes. Cardiology was consulted.   Objective:  Filed Vitals:   05/11/15 0754  BP: 172/95  Pulse:   Temp:   Resp:     Right shoulder dressing c/d/i In sling Wiggles fingers and distally NVI Comfortable, no SOB   Labs:   Recent Labs  05/10/15 0936  HGB 9.2*   Recent Labs  05/10/15 0936  HCT 27.0*   Recent Labs  05/10/15 0936  NA 137  K 4.3  GLUCOSE 144*    Assessment and Plan: -1 day s/p right shoulder arthroscopy for labral tears, with preoperative respiratory failure requiring intubation -Overnight chest pain- cardiology consulted and suggests CP secondary to poorly controlled HTN rather than ACS as EKG is unchaged compared to previous- cycling troponins, medications adjusted, added nitro -continued uncontrolled HTN- 170 systolic this am, will defer to cardiology and appreciate their suggestions for treatment -Okay to be discharged today when BP under control and asymptomatic with CP, percocet for pain control -We greatly appreciate Dr. Zachery Conch and cardiology's help in treating this complex patient  VTE proph: SCDs, ASA allergy

## 2015-05-11 NOTE — Progress Notes (Signed)
Interval coverage note: (please see full H&P by overnight cardiology fellow)  34 year old morbidly obese female with hypertension, hyperlipidemia, diabetes mellitus type 1, asthma, anemia, combined systolic and diastolic congestive heart failure (EF of 35-40% with grade 2 diastolic dysfunction by 2-D echo on 05/07/15), pulmonary HTN (RVSP ) CKD-IV, who was admitted due to right shoulder labral tears that failed medical management.S/p R shoulder arthroscopic debridement extensive SLAP tear, anterior and posterior labral tears with biceps tenototomy and R shoulder arthroscopic bursectomy and subacromial decompression with Dr. Bradd Burner. Cardiology is consulted for chest pain and hypertension during admission.  Subjective  Not feeling good due to nausea with pain med. No more CP. CP last 2 hrs. No significant SOB now.  Physical exam:  Lung CTA Heart: RRR General: NAD, however appears to be slightly uncomfortable Extremities: no significant edema  Plan:  1. Chest pain with elevated trop in the setting HTN urgency and Cr >5  - note last lexiscan myoview 07/2013 showed anterior ischemia, EF 31%  - Cath 09/20/2013 normal LM, minor irregularities in LAD, LCx and RCA. No significant CAD identified. Myoview was felt to be falsely positive  - Echo 05/07/2015 EF 35-40%, grade 2 diastolic dysfunction, akinesis of basal, mid inferior, inferoseptal, anteroseptal wall, mild LVH, PA peak pressure 62, mild pericardial effusion. (note previous echo in 08/2013 showed akinesis of base/mid inferior segment, other walls hypokinetic, apex move the best, EF 35-40%, small pericardial effusion)  - her CP maybe related to poorly controlled HTN, although ACS cannot be r/o, will continue to trend trop. Unclear significance of EKG change (baseline TWI in lateral leads, now has ST depression in inferolateral leads as well, these changes is same as preop EKG on 8/5, new when compare to EKG on 03/19/2015, however if they are true  ST depression, would expect to see Q waves after 7 days which is not the case here)   - First trop 0.53 (which is likely related to HTN and Cr >5). Noted she had falsely positive myoview in 2014, therefore no point of doing myoview in this case. Fortunately, her cath in 08/2013 only shows mild irregularities without significant CAD. Would like to avoid cath as any amount of contrast has high likelihood of pushing her into ESRD.   - Not currently on heparin due to recent surgery, start IV heparin when ok with Surgery.  - will focus on controlling BP, increase hydralazine to 50mg  TID.   2. SOB: given 1 dose of 120mg  IV lasix last night. She is euvolemic on exam, will instruct nurse to hold tonight dose of PO lasix to avoid renal injury from over diuresing  3. Combined systolic and diastolic HF/NICM  4. S/p R shoulder arthroscopic debridement extensive SLAP tear, anterior and posterior labral tears with biceps tenototomy and R shoulder arthroscopic bursectomy and subacromial decompression with Dr. Bradd Burner  - per ortho, stable for discharge from their perspective. However obviously with ongoing CP and elevated trop, patient will need to stay for the time being for observation  5. Stage IV-V CKD  6. HTN 7. HLD 8. DM type 1 9. Pulm HTN  [name], [name]  Agree with assessment and plan as noted. No chest pain; BP better at 131/75. Doubt ACS. Cycle troponin.  Lennette Bihari, MD  05/11/2015  11:47 AM

## 2015-05-11 NOTE — Progress Notes (Addendum)
Brief progressive note  34 year old lady with past medical history of hypertension, hyperlipidemia, diabetes mellitus type 1, COPD, asthma, anemia, combined systolic and diastolic congestive heart failure (EF of 35-40% with grade 2 diastolic dysfunction by 2-D echo on 8//2014), CKD-IV, who was admitted due to right shoulder labral tears. She is s/p of surgery by Dr. Ave Filter on 05/10/15.  I was called by PA Gus Puma for consult on this pt due to new onset chest pain and worsening ST segment depression and T-wave inversion at 2:20 AM. I suggested Gus Puma to consult to Cardiology due to worsening ST depression and TWI. He agreed to do so. I gave my cell phone number to Gus Puma in case they need my help again.  Lorretta Harp, MD  [facility] Pager [phone]  If 7PM-7AM, please contact night-coverage [email or web address] Password [facility] 05/11/2015, 2:46 AM

## 2015-05-11 NOTE — Progress Notes (Signed)
Contacted the orthopedic service Dr. Veda Canning PA, who is ok with starting IV heparin since she is 24 hours out from her surgery. Discussed with MD, will do IV heparin without bolus to decrease bleeding risk.  Note, patient has anaphylaxis with ASA, may need to consider plavix single therapy.  Ramond Dial PA Pager: [phone]

## 2015-05-12 DIAGNOSIS — Z9889 Other specified postprocedural states: Secondary | ICD-10-CM

## 2015-05-12 DIAGNOSIS — N185 Chronic kidney disease, stage 5: Secondary | ICD-10-CM

## 2015-05-12 DIAGNOSIS — I429 Cardiomyopathy, unspecified: Secondary | ICD-10-CM

## 2015-05-12 DIAGNOSIS — I214 Non-ST elevation (NSTEMI) myocardial infarction: Secondary | ICD-10-CM

## 2015-05-12 LAB — HEPARIN LEVEL (UNFRACTIONATED)
HEPARIN UNFRACTIONATED: 0.19 [IU]/mL — AB (ref 0.30–0.70)
Heparin Unfractionated: <0.1 IU/mL — ABNORMAL LOW (ref 0.30–0.70)
Heparin Unfractionated: 0.7 IU/mL (ref 0.30–0.70)

## 2015-05-12 LAB — GLUCOSE, CAPILLARY
GLUCOSE-CAPILLARY: 106 mg/dL — AB (ref 65–99)
GLUCOSE-CAPILLARY: 121 mg/dL — AB (ref 65–99)
GLUCOSE-CAPILLARY: 87 mg/dL (ref 65–99)
Glucose-Capillary: 101 mg/dL — ABNORMAL HIGH (ref 65–99)

## 2015-05-12 LAB — CBC
HCT: 28.1 % — ABNORMAL LOW (ref 36.0–46.0)
HEMOGLOBIN: 8.5 g/dL — AB (ref 12.0–15.0)
MCH: 26.9 pg (ref 26.0–34.0)
MCHC: 30.2 g/dL (ref 30.0–36.0)
MCV: 88.9 fL (ref 78.0–100.0)
PLATELETS: 241 10*3/uL (ref 150–400)
RBC: 3.16 MIL/uL — AB (ref 3.87–5.11)
RDW: 14.5 % (ref 11.5–15.5)
WBC: 7.3 10*3/uL (ref 4.0–10.5)

## 2015-05-12 LAB — TROPONIN I
Troponin I: 8.82 ng/mL (ref <0.031)
Troponin I: 7.92 ng/mL (ref <0.031)
Troponin I: 7.89 ng/mL (ref <0.031)

## 2015-05-12 MED ORDER — CLOPIDOGREL BISULFATE 75 MG PO TABS
75.0000 mg | ORAL_TABLET | Freq: Every day | ORAL | Status: DC
  Administered 2015-05-12 – 2015-05-13 (×2): 75 mg via ORAL
  Filled 2015-05-12 (×2): qty 1

## 2015-05-12 NOTE — Progress Notes (Signed)
ANTICOAGULATION CONSULT NOTE - Follow-up Consult  Pharmacy Consult for Heparin Indication: chest pain/ACS  Allergies  Allergen Reactions  . Aspirin Anaphylaxis  . Sulfur Hives  . Tramadol Other (See Comments)    Pt states she feels weird    Patient Measurements: Height: 5\' 8"  (172.7 cm) Weight: (!) 337 lb (152.862 kg) IBW/kg (Calculated) : 63.9 Heparin Dosing Weight: 101.8kg  Vital Signs: Temp: 97.9 F (36.6 C) (08/12 1933) Temp Source: Oral (08/12 1933) BP: 130/72 mmHg (08/12 1933) Pulse Rate: 80 (08/12 1933)  Labs:  Recent Labs  05/10/15 0936 05/11/15 0743 05/11/15 1323 05/11/15 1902 05/11/15 2225  HGB 9.2*  --   --   --   --   HCT 27.0*  --   --   --   --   HEPARINUNFRC  --   --   --   --  <0.10*  CREATININE  --   --   --  6.18*  --   TROPONINI  --  0.53* 5.42* 11.22*  --    Estimated Creatinine Clearance: 20 mL/min (by C-G formula based on Cr of 6.18).  Assessment: 34 yo female now with chest pain and an elevated troponin.  She recently had surgery and is at risk for bleeding so heparin started with no bolus. Troponin up to 11.2. Heparin level undetectable on 1500 units/hr. No issues with line or bleeding per RN.  Goal of Therapy:  Heparin level 0.3-0.7 units/ml Monitor platelets by anticoagulation protocol: Yes   Plan:  - Increase IV heparin to 1900 units/hr - Check heparin level in 6 hours  Christoper Fabian, PharmD, BCPS Clinical pharmacist, pager [phone] 05/12/2015,12:49 AM

## 2015-05-12 NOTE — Progress Notes (Signed)
ANTICOAGULATION CONSULT NOTE - Follow-up Consult  Pharmacy Consult for Heparin Indication: chest pain/ACS  Allergies  Allergen Reactions  . Aspirin Anaphylaxis  . Sulfur Hives  . Tramadol Other (See Comments)    Pt states she feels weird    Patient Measurements: Height: 5\' 8"  (172.7 cm) Weight: (!) 337 lb (152.862 kg) IBW/kg (Calculated) : 63.9 Heparin Dosing Weight: 101.8kg  Vital Signs: Temp: 99 F (37.2 C) (08/13 0543) Temp Source: Oral (08/13 0543) BP: 143/83 mmHg (08/13 0543) Pulse Rate: 114 (08/13 0543)  Labs:  Recent Labs  05/10/15 0936 05/11/15 0743 05/11/15 1323 05/11/15 1902 05/11/15 2225 05/12/15 0630  HGB 9.2*  --   --   --   --  8.5*  HCT 27.0*  --   --   --   --  28.1*  PLT  --   --   --   --   --  241  HEPARINUNFRC  --   --   --   --  <0.10* 0.19*  CREATININE  --   --   --  6.18*  --   --   TROPONINI  --  0.53* 5.42* 11.22*  --   --    Estimated Creatinine Clearance: 20 mL/min (by C-G formula based on Cr of 6.18).  Assessment: 34 yo female now with chest pain and an elevated troponin.  She recently had surgery and is at risk for bleeding so heparin started with no bolus. Troponin up to 11.2. Heparin level undetectable on 1500 units/hr. Increased over night to 1900 units/hr - no bolus again with recent surgery. HL subtherapeutic 0.19. No issues with line or bleeding per RN.  Goal of Therapy:  Heparin level 0.3-0.7 units/ml Monitor platelets by anticoagulation protocol: Yes   Plan:  - Increase IV heparin to 2350 units/hr - Check heparin level in 6 hours  Sherron Monday, PharmD Clinical Pharmacy Resident Pager: [phone] 05/12/2015 9:08 AM

## 2015-05-12 NOTE — Progress Notes (Signed)
PATIENT ID: [name]  MRN: [id number]  DOB/AGE:  02-08-1981 / 34 y.o.  2 Days Post-Op Procedure(s) (LRB): RIGHT SHOULDER ARTHROSCOPY WITH BICEPS TENOTOMY, DEBRIDEMENT LABRAL TEAR (Right)    PROGRESS NOTE Subjective:   Patient is alert, oriented, no Nausea, no Vomiting, yes passing gas, no Bowel Movement. Taking PO well. Denies  Calf Pain. Using Incentive Spirometer, PAS in place.  Patient reports pain as 5 on 0-10 scale,    Objective: Vital signs in last 24 hours: Temp:  [97.9 F (36.6 C)-99 F (37.2 C)] 99 F (37.2 C) (08/13 0543) Pulse Rate:  [80-114] 114 (08/13 0543) Resp:  [18-20] 18 (08/13 0543) BP: (130-177)/(69-99) 143/83 mmHg (08/13 0543) SpO2:  [98 %-100 %] 98 % (08/13 0543)    Intake/Output from previous day: I/O last 3 completed shifts: In: 360 [P.O.:360] Out: -    Intake/Output this shift:     LABORATORY DATA:  Recent Labs  05/10/15 0936  05/11/15 1611 05/11/15 1902 05/11/15 2133 05/12/15 0630 05/12/15 0641  WBC  --   --   --   --   --  7.3  --   HGB 9.2*  --   --   --   --  8.5*  --   HCT 27.0*  --   --   --   --  28.1*  --   PLT  --   --   --   --   --  241  --   NA 137  --   --  136  --   --   --   K 4.3  --   --  4.4  --   --   --   CL  --   --   --  105  --   --   --   CO2  --   --   --  18*  --   --   --   BUN  --   --   --  60*  --   --   --   CREATININE  --   --   --  6.18*  --   --   --   GLUCOSE 144*  --   --  116*  --   --   --   GLUCAP  --   < > 138*  --  107*  --  106*  CALCIUM  --   --   --  7.4*  --   --   --   < > = values in this interval not displayed.  Examination: Neurologically intact Neurovascular intact Sensation intact distally Intact pulses distally Incision: dressing C/D/I Compartment soft} Pt able to freely move elbow and wrist Assessment:   2 Days Post-Op Procedure(s) (LRB): RIGHT SHOULDER ARTHROSCOPY WITH BICEPS TENOTOMY, DEBRIDEMENT LABRAL TEAR (Right) ADDITIONAL DIAGNOSIS:  Chest pain with elevated trop,  hypertension, hyperlipidemia, diabetes type 1 Pt is currently followed by cardiology for chest pain and SOB.  Plan:  -2 day s/p right shoulder arthroscopy for labral tears, with preoperative respiratory failure requiring intubation -Overnight chest pain- cardiology consulted and suggests CP secondary to poorly controlled HTN rather than ACS as EKG is unchaged compared to previous- cycling troponins, medications adjusted, added nitro -continued uncontrolled HTN- 170 systolic this am, will defer to cardiology and appreciate their suggestions for treatment -Okay to be discharged today when BP under control and asymptomatic with CP, percocet for pain control -We greatly appreciate cardiology's help in treating this complex  patient  VTE proph: SCDs, ASA allergy, it looks like cardiology has started heparin and may consider plavix for out patient.      [name] R 05/12/2015, 8:18 AM

## 2015-05-12 NOTE — Progress Notes (Signed)
Primary cardiologist: Dr. Nanetta Batty  Seen for followup: NSTEMI, cardiomyopathy  Subjective:    No complaints of chest pain. Some shoulder discomfort following surgery. Eager to go home.  Objective:   Temp:  [97.9 F (36.6 C)-99 F (37.2 C)] 99 F (37.2 C) (08/13 0543) Pulse Rate:  [80-114] 114 (08/13 0543) Resp:  [18-20] 18 (08/13 0543) BP: (130-155)/(72-88) 143/83 mmHg (08/13 0543) SpO2:  [98 %-99 %] 98 % (08/13 0543) Last BM Date: 05/09/15  Filed Weights   05/10/15 0919  Weight: 337 lb (152.862 kg)    Intake/Output Summary (Last 24 hours) at 05/12/15 1351 Last data filed at 05/12/15 0900  Gross per 24 hour  Intake    120 ml  Output      0 ml  Net    120 ml    Telemetry: Sinus rhythm.  Exam:  General: Morbidly obese, no distress.  Lungs: Clear with decreased breath sounds.  Cardiac: Indistinct PMI, RRR without gallop.  Abdomen: NABS.  Extremities: No pitting edema.  Lab Results:  Basic Metabolic Panel:  Recent Labs Lab 05/07/15 1432 05/10/15 0936 05/11/15 1902  NA 137 137 136  K 4.7 4.3 4.4  CL 108  --  105  CO2 18*  --  18*  GLUCOSE 172* 144* 116*  BUN 55*  --  60*  CREATININE 5.83*  --  6.18*  CALCIUM 7.4*  --  7.4*    CBC:  Recent Labs Lab 05/07/15 1432 05/10/15 0936 05/12/15 0630  WBC 4.8  --  7.3  HGB 9.7* 9.2* 8.5*  HCT 30.2* 27.0* 28.1*  MCV 87.8  --  88.9  PLT 227  --  241    Cardiac Enzymes:  Recent Labs Lab 05/11/15 1323 05/11/15 1902 05/12/15 0805  TROPONINI 5.42* 11.22* 8.82*     Medications:   Scheduled Medications: . atorvastatin  40 mg Oral QHS  . carvedilol  25 mg Oral BID WC  . furosemide  160 mg Oral BID  . hydrALAZINE  50 mg Oral 3 times per day  . insulin aspart  0-20 Units Subcutaneous TID WC  . insulin glargine  20 Units Subcutaneous QHS  . isosorbide dinitrate  40 mg Oral TID  . nitroGLYCERIN  1 inch Topical 4 times per day     Infusions: . sodium chloride 100 mL/hr at 05/10/15  2238  . heparin 2,350 Units/hr (05/12/15 0908)     PRN Medications:  acetaminophen **OR** acetaminophen, albuterol, aluminum hydroxide, budesonide, diphenhydrAMINE, menthol-cetylpyridinium **OR** phenol, metoCLOPramide **OR** metoCLOPramide (REGLAN) injection, morphine injection, ondansetron **OR** ondansetron (ZOFRAN) IV, oxyCODONE   Assessment:   1. NSTEMI, type 2 event suspected with elevated blood pressure and myocardial strain in the setting of known cardiomyopathy and previously documented mild coronary atherosclerosis as of 2014. Peak troponin I was however 11 and heparin drip initiated yesterday in case true plaque rupture event. At this point plan is medical management, deferring repeat coronary angiography with high risk of ESRD requiring hemodialysis.  2. CKD stage 5. Creatinine 6.1.  3. Nonischemic cardiomyopathy based on assessment 2014, LVEF 35-40% by recent follow-up echocardiogram.  4. Morbid obesity.  5. Type 1 diabetes mellitus.  6. Status post right shoulder arthroscopic debridement extensive SLAP tear, anterior and posterior labral tears with biceps tenototomy and R shoulder arthroscopic bursectomy and subacromial decompression with Dr. Bradd Burner on August 11.   Plan/Discussion:    Discussed with patient, would not discharge home today. Anticipate heparin drip for 48 hours. Start Plavix, not using  aspirin with history of allergy. Otherwise continue Coreg, Lipitor, hydralazine, Imdur, stop nitroglycerin paste. She is on high-dose Lasix, currently at home dose. Follow-up ECG a.m.   Jonelle Sidle, M.D., F.A.C.C.

## 2015-05-12 NOTE — Progress Notes (Signed)
ANTICOAGULATION CONSULT NOTE - Follow Up Consult  Pharmacy Consult for heparin Indication: chest pain/ACS  Allergies  Allergen Reactions  . Aspirin Anaphylaxis  . Sulfur Hives  . Tramadol Other (See Comments)    Pt states she feels weird     Patient Measurements: Height: 5\' 8"  (172.7 cm) Weight: (!) 337 lb (152.862 kg) IBW/kg (Calculated) : 63.9 Heparin Dosing Weight: 101.8 kg  Vital Signs: Temp: 99 F (37.2 C) (08/13 0543) Temp Source: Oral (08/13 0543) BP: 143/83 mmHg (08/13 0543) Pulse Rate: 114 (08/13 0543)  Labs:  Recent Labs  05/10/15 0936  05/11/15 1323 05/11/15 1902 05/11/15 2225 05/12/15 0630 05/12/15 0805 05/12/15 1545  HGB 9.2*  --   --   --   --  8.5*  --   --   HCT 27.0*  --   --   --   --  28.1*  --   --   PLT  --   --   --   --   --  241  --   --   HEPARINUNFRC  --   --   --   --  <0.10* 0.19*  --  0.70  CREATININE  --   --   --  6.18*  --   --   --   --   TROPONINI  --   < > 5.42* 11.22*  --   --  8.82*  --   < > = values in this interval not displayed.  Estimated Creatinine Clearance: 20 mL/min (by C-G formula based on Cr of 6.18).   Medications:  Scheduled:  . atorvastatin  40 mg Oral QHS  . carvedilol  25 mg Oral BID WC  . clopidogrel  75 mg Oral Daily  . furosemide  160 mg Oral BID  . hydrALAZINE  50 mg Oral 3 times per day  . insulin aspart  0-20 Units Subcutaneous TID WC  . insulin glargine  20 Units Subcutaneous QHS  . isosorbide dinitrate  40 mg Oral TID   Infusions:  . sodium chloride 100 mL/hr at 05/10/15 2238  . heparin 2,350 Units/hr (05/12/15 0908)    Assessment: 34 yo female with chest pain is currently on therapeutic heparin.  Heparin level is 0.7.   Goal of Therapy:  Heparin level 0.3-0.7 units/ml Monitor platelets by anticoagulation protocol: Yes   Plan:  - since level is on higher side, will empirically reduce drip to 2250 units/hr and recheck heparin level in 8 hr  Darcee Dekker, Tsz-Yin 05/12/2015,4:44 PM

## 2015-05-13 LAB — CBC
HEMATOCRIT: 27.2 % — AB (ref 36.0–46.0)
Hemoglobin: 8.4 g/dL — ABNORMAL LOW (ref 12.0–15.0)
MCH: 27.8 pg (ref 26.0–34.0)
MCHC: 30.9 g/dL (ref 30.0–36.0)
MCV: 90.1 fL (ref 78.0–100.0)
Platelets: 215 10*3/uL (ref 150–400)
RBC: 3.02 MIL/uL — ABNORMAL LOW (ref 3.87–5.11)
RDW: 14.6 % (ref 11.5–15.5)
WBC: 7.1 10*3/uL (ref 4.0–10.5)

## 2015-05-13 LAB — HEPARIN LEVEL (UNFRACTIONATED): HEPARIN UNFRACTIONATED: 0.27 [IU]/mL — AB (ref 0.30–0.70)

## 2015-05-13 LAB — GLUCOSE, CAPILLARY
GLUCOSE-CAPILLARY: 138 mg/dL — AB (ref 65–99)
Glucose-Capillary: 100 mg/dL — ABNORMAL HIGH (ref 65–99)

## 2015-05-13 NOTE — Progress Notes (Signed)
ANTICOAGULATION CONSULT NOTE - Follow Up Consult  Pharmacy Consult for heparin Indication: chest pain/ACS  Allergies  Allergen Reactions  . Aspirin Anaphylaxis  . Sulfur Hives  . Tramadol Other (See Comments)    Pt states she feels weird     Patient Measurements: Height: 5\' 8"  (172.7 cm) Weight: (!) 337 lb (152.862 kg) IBW/kg (Calculated) : 63.9 Heparin Dosing Weight: 101.8 kg  Vital Signs: Temp: 99 F (37.2 C) (08/13 2046) Temp Source: Oral (08/13 2046) BP: 123/77 mmHg (08/13 2046) Pulse Rate: 101 (08/13 2046)  Labs:  Recent Labs  05/10/15 0936  05/11/15 1902  05/12/15 0630 05/12/15 0805 05/12/15 1545 05/12/15 1835 05/13/15 0157  HGB 9.2*  --   --   --  8.5*  --   --   --   --   HCT 27.0*  --   --   --  28.1*  --   --   --   --   PLT  --   --   --   --  241  --   --   --   --   HEPARINUNFRC  --   --   --   < > 0.19*  --  0.70  --  0.27*  CREATININE  --   --  6.18*  --   --   --   --   --   --   TROPONINI  --   < > 11.22*  --   --  8.82* 7.92* 7.89*  --   < > = values in this interval not displayed.  Estimated Creatinine Clearance: 20 mL/min (by C-G formula based on Cr of 6.18).   Medications:  Scheduled:  . atorvastatin  40 mg Oral QHS  . carvedilol  25 mg Oral BID WC  . clopidogrel  75 mg Oral Daily  . furosemide  160 mg Oral BID  . hydrALAZINE  50 mg Oral 3 times per day  . insulin aspart  0-20 Units Subcutaneous TID WC  . insulin glargine  20 Units Subcutaneous QHS  . isosorbide dinitrate  40 mg Oral TID   Infusions:  . sodium chloride 100 mL/hr at 05/12/15 2316  . heparin 2,250 Units/hr (05/12/15 1804)    Assessment: 34 yo female with chest pain is now sub-therapeutic on IV heparin at 2250 units/hr.  Heparin level is 0.27. RN reports no interruptions in infusion and s/s of bleeding   Goal of Therapy:  Heparin level 0.3-0.7 units/ml Monitor platelets by anticoagulation protocol: Yes   Plan:  - Increase heparin infusion to 2300 units/hr -  F/u 8 hr HL  Vinnie Level, PharmD., BCPS Clinical Pharmacist Pager [phone]

## 2015-05-13 NOTE — Progress Notes (Addendum)
PATIENT ID: [name]  MRN: [id number]  DOB/AGE:  36-Jun-1982 / 36 y.o.  3 Days Post-Op Procedure(s) (LRB): RIGHT SHOULDER ARTHROSCOPY WITH BICEPS TENOTOMY, DEBRIDEMENT LABRAL TEAR (Right)    PROGRESS NOTE Subjective:   Patient is alert, oriented, no Nausea, no Vomiting, yes passing gas, no Bowel Movement. Taking PO well. Denies SOB, Chest or Calf Pain. Using Incentive Spirometer, PAS in place. Ambulate WBAT, Patient reports pain as 5 on 0-10 scale,     Objective: Vital signs in last 24 hours: Temp:  [99 F (37.2 C)-99.1 F (37.3 C)] 99.1 F (37.3 C) (08/14 0508) Pulse Rate:  [101-108] 108 (08/14 0508) Resp:  [18] 18 (08/14 0508) BP: (123-150)/(77-86) 150/86 mmHg (08/14 0508) SpO2:  [90 %-94 %] 94 % (08/14 0508)    Intake/Output from previous day: I/O last 3 completed shifts: In: 120 [P.O.:120] Out: -    Intake/Output this shift:     LABORATORY DATA:  Recent Labs  05/10/15 0936  05/11/15 1902  05/12/15 0630  05/12/15 1642 05/12/15 2114 05/13/15 0157 05/13/15 0617  WBC  --   --   --   --  7.3  --   --   --  7.1  --   HGB 9.2*  --   --   --  8.5*  --   --   --  8.4*  --   HCT 27.0*  --   --   --  28.1*  --   --   --  27.2*  --   PLT  --   --   --   --  241  --   --   --  215  --   NA 137  --  136  --   --   --   --   --   --   --   K 4.3  --  4.4  --   --   --   --   --   --   --   CL  --   --  105  --   --   --   --   --   --   --   CO2  --   --  18*  --   --   --   --   --   --   --   BUN  --   --  60*  --   --   --   --   --   --   --   CREATININE  --   --  6.18*  --   --   --   --   --   --   --   GLUCOSE 144*  --  116*  --   --   --   --   --   --   --   GLUCAP  --   < >  --   < >  --   < > 101* 87  --  100*  CALCIUM  --   --  7.4*  --   --   --   --   --   --   --   < > = values in this interval not displayed.  Examination: Neurologically intact Neurovascular intact Sensation intact distally Intact pulses distally Incision: dressing C/D/I No cellulitis  present Compartment soft}  Assessment:   3 Days Post-Op Procedure(s) (LRB): RIGHT SHOULDER ARTHROSCOPY WITH BICEPS TENOTOMY, DEBRIDEMENT LABRAL TEAR (Right) ADDITIONAL  DIAGNOSIS:  Diabetes, Hypertension and Renal Insufficiency Chronic  Followed by cardiology for NSTEMI type 2 event.  Continue to follow cardiology recommendations and will await approval for D/C.  Plan: -3 days s/p right shoulder arthroscopy for labral tears, with preoperative respiratory failure requiring intubation -Okay to be discharged today when BP under control and asymptomatic with CP, percocet for pain control -We greatly appreciate cardiology's help in treating this complex patient  VTE proph: SCDs, ASA allergy, it looks like cardiology has started heparin and may consider plavix for out patient.  3:10 Pm.  I just spoke with Corine Shelter, PA-C with Cardiology.  He has confirmed that it is Ok for the pt to be discharged from a cardiology standpoint.  The do recommend that the pt stay another night to further adjust medications, but the pt is insistent on leaving.  They have arranged for post discharge follow up with Dr. Allyson Sabal.    [name] R 05/13/2015, 8:56 AM

## 2015-05-13 NOTE — Progress Notes (Signed)
Subjective:  No chest pain. Pt is insistent on discharge today.  Objective:  Vital Signs in the last 24 hours: Temp:  [99 F (37.2 C)-99.1 F (37.3 C)] 99.1 F (37.3 C) (08/14 0508) Pulse Rate:  [101-108] 108 (08/14 0508) Resp:  [18] 18 (08/14 0508) BP: (123-150)/(77-86) 150/86 mmHg (08/14 0508) SpO2:  [90 %-94 %] 94 % (08/14 0508)  Physical Exam: General appearance: alert, cooperative, no distress and morbidly obese Lungs: clear to auscultation bilaterally Heart: regular rate and rhythm  Rate: 108 Rhythm: sinus tachycardia  Lab Results:  Recent Labs  05/12/15 0630 05/13/15 0157  WBC 7.3 7.1  HGB 8.5* 8.4*  PLT 241 215    Recent Labs  05/11/15 1902  NA 136  K 4.4  CL 105  CO2 18*  GLUCOSE 116*  BUN 60*  CREATININE 6.18*    Recent Labs  05/12/15 1545 05/12/15 1835  TROPONINI 7.92* 7.89*   No results for input(s): INR in the last 72 hours.  Scheduled Meds: . atorvastatin  40 mg Oral QHS  . carvedilol  25 mg Oral BID WC  . clopidogrel  75 mg Oral Daily  . furosemide  160 mg Oral BID  . hydrALAZINE  50 mg Oral 3 times per day  . insulin aspart  0-20 Units Subcutaneous TID WC  . insulin glargine  20 Units Subcutaneous QHS  . isosorbide dinitrate  40 mg Oral TID   Continuous Infusions: . sodium chloride 100 mL/hr at 05/12/15 2316  . heparin 2,300 Units/hr (05/13/15 0524)   PRN Meds:.acetaminophen **OR** acetaminophen, albuterol, aluminum hydroxide, budesonide, diphenhydrAMINE, menthol-cetylpyridinium **OR** phenol, metoCLOPramide **OR** metoCLOPramide (REGLAN) injection, morphine injection, ondansetron **OR** ondansetron (ZOFRAN) IV, oxyCODONE   Imaging: Imaging results have been reviewed  Cardiac Studies: Echo 05/07/15 LV EF: 35% -  40%  ------------------------------------------------------------------- History:  PMH: Acquired from the patient and from the patient&'s chart. Pre-op work-up prior to shoulder surgery.  Dyspnea.  ------------------------------------------------------------------- Study Conclusions  - Left ventricle: There is akinesis of the basal and mid inferior, inferoseptal, anteroseptal walls. The cavity size was mildly dilated. There was mild concentric hypertrophy. Systolic function was moderately reduced. The estimated ejection fraction was in the range of 35% to 40%. Features are consistent with a pseudonormal left ventricular filling pattern, with concomitant abnormal relaxation and increased filling pressure (grade 2 diastolic dysfunction). Doppler parameters are consistent with elevated ventricular end-diastolic filling pressure. - Aortic valve: Trileaflet; normal thickness leaflets. Transvalvular velocity was within the normal range. There was no stenosis. There was no regurgitation. - Aortic root: The aortic root was normal in size. - Mitral valve: Structurally normal valve. There was mild regurgitation. - Left atrium: The atrium was mildly dilated. - Right ventricle: The cavity size was normal. Wall thickness was normal. Systolic function was normal. - Tricuspid valve: There was mild regurgitation. - Pulmonary arteries: Systolic pressure was severely increased. PA peak pressure: 62 mm Hg (S). - Pericardium, extracardiac: A mild pericardial effusion was identified posterior to the heart. Features were not consistent with tamponade physiology.  Impressions:  - LV endocardium is poorly visualized. There appears to be no significant difference in LVEF since the last study on 09/14/2015. Echocontrast should be considered for further evaluation. There is severe pulmonary hypertension.  Assessment/Plan:   34 year old morbidly obese AA female with hypertension, false positive Myoview 2014 with mild CAD then, hyperlipidemia, diabetes mellitus type 1, asthma, anemia, combined systolic and diastolic congestive heart failure (EF of  35-40% with grade 2 diastolic  dysfunction by 2-D echo on 05/07/15), pulmonary HTN (RVSP ) CKD-V, who was admitted 05/10/15 due to right shoulder labral tears that failed medical management. She underwent surgery 05/10/15. Post op she had chest pain. Subsequent Troponin peaked at 11. Plan is medical Rx and she has been on Heparin 36 hrs.   Active Problems:   CKD (chronic kidney disease) stage 5, GFR less than 15 ml/min   NSTEMI- type 2, Troponin 11.2   Uncontrolled type 2 diabetes with renal manifestation   Hypertension   Non-ischemic cardiomyopathy - false positive Myoview- EF 45-40%   Acute combined systolic and diastolic heart failure   History of arthroscopy of right shoulder-05/10/15   Hyperlipidemia   Morbid obesity- BMI 51   Respiratory failure requiring intubation   PLAN: She is not well beta blocked. Consider increasing Coreg to 37.5 mg BID. I will arrange TOC  f/u with Dr Allyson Sabal.   Corine Shelter PA-C 05/13/2015, 11:10 AM [phone]   Attending note:  Patient seen and examined. Discussed case with Mr. Lenn Cal. Please refer to my note from yesterday for details. Ms. Secrest insists on going home today, has already taken off her telemetry. She denies having any chest pain or shortness of breath. Heart rate has varied from the 70s to low 100 range and in sinus rhythm, systolic blood pressure in the 140s to 150s most recently. Lungs are clear without crackles, cardiac exam with distant RRR and no obvious gallop. My recommendation would be to continue hospital observation through untill tomorrow, make additional medication adjustments as able. She however wants to go home today instead, and we will therefore make a transition of care visit next week with Dr. Allyson Sabal or assistant. I have recommended that she return to the hospital if she begins to experience chest pain or increasing shortness of breath. Heparin infusion is being stopped at this point. Would continue Coreg, Lipitor, Plavix,  home dose of Lasix, hydralazine, and Imdur. She has a known nonischemic cardiomyopathy, LVEF 35-40% range at this time. With increased troponin I levels, still could be due to myocardial strain with uncontrolled hypertension and in the perioperative setting. With history of diabetes however, a type 1 NSTEMI is still possibility. Cardiac catheterization was not pursued in light of advanced renal failure, CKD stage V with creatinine 6.1. She is at exceedingly high risk for contrast nephropathy hastening onset of hemodialysis.  Jonelle Sidle, M.D., F.A.C.C.

## 2015-05-13 NOTE — Discharge Summary (Signed)
Patient ID: [name] MRN: [id number] DOB/AGE: Mar 30, 1981 34 y.o.  Admit date: 05/10/2015 Discharge date: 05/13/2015  Admission Diagnoses:  Active Problems:   Uncontrolled type 2 diabetes with renal manifestation   Hyperlipidemia   Hypertension   Non-ischemic cardiomyopathy - false positive Myoview- EF 45-40%   Morbid obesity- BMI 51   Acute combined systolic and diastolic heart failure   CKD (chronic kidney disease) stage 5, GFR less than 15 ml/min   Respiratory failure requiring intubation   History of arthroscopy of right shoulder-05/10/15   NSTEMI- type 2, Troponin 11.2   Discharge Diagnoses:  Same  Past Medical History  Diagnosis Date  . Hypertension   . Asthma   . Hyperlipidemia   . Pneumonia     "couple times; have it now" (09/15/2013)  . Chronic bronchitis     "just about q yr" (09/15/2013)  . Shortness of breath     "just recently; related to the pneumonia" (09/15/2013)  . Anemia   . Migraine     "get them alot" (09/15/2013)  . Arthritis     "left hand" (09/15/2013)  . Chronic kidney disease     "low kidney function" (09/15/2013)  . COPD (chronic obstructive pulmonary disease)   . Normal coronary arteries     by cardiac catheterization 09/20/13  . Type 1 diabetes mellitus     Surgeries: Procedure(s): RIGHT SHOULDER ARTHROSCOPY WITH BICEPS TENOTOMY, DEBRIDEMENT LABRAL TEAR on 05/10/2015   Consultants: Treatment Team:  Rounding Lbcardiology, MD  Discharged Condition: Improved  Hospital Course: [name] is an 34 y.o. female who was admitted 05/10/2015 for operative treatment of<principal problem not specified>. Patient has severe unremitting pain that affects sleep, daily activities, and work/hobbies. After pre-op clearance the patient was taken to the operating room on 05/10/2015 and underwent  Procedure(s): RIGHT SHOULDER ARTHROSCOPY WITH BICEPS TENOTOMY, DEBRIDEMENT LABRAL TEAR.    Patient was given perioperative antibiotics: Anti-infectives    Start      Dose/Rate Route Frequency Ordered Stop   05/10/15 1700  ceFAZolin (ANCEF) IVPB 2 g/50 mL premix     2 g 100 mL/hr over 30 Minutes Intravenous Every 6 hours 05/10/15 1514 05/11/15 0638   05/10/15 1030  ceFAZolin (ANCEF) 3 g in dextrose 5 % 50 mL IVPB     3 g 160 mL/hr over 30 Minutes Intravenous To Surgery 05/09/15 1337 05/10/15 1115       Patient was given sequential compression devices, early ambulation, and chemoprophylaxis to prevent DVT.  Patient benefited maximally from hospital stay and there were no complications.    Recent vital signs: Patient Vitals for the past 24 hrs:  BP Temp Temp src Pulse Resp SpO2  05/13/15 0508 (!) 150/86 mmHg 99.1 F (37.3 C) Oral (!) 108 18 94 %  05/12/15 2046 123/77 mmHg 99 F (37.2 C) Oral (!) 101 18 90 %     Recent laboratory studies:  Recent Labs  05/11/15 1902 05/12/15 0630 05/13/15 0157  WBC  --  7.3 7.1  HGB  --  8.5* 8.4*  HCT  --  28.1* 27.2*  PLT  --  241 215  NA 136  --   --   K 4.4  --   --   CL 105  --   --   CO2 18*  --   --   BUN 60*  --   --   CREATININE 6.18*  --   --   GLUCOSE 116*  --   --   CALCIUM 7.4*  --   --  Discharge Medications:     Medication List    TAKE these medications        albuterol 108 (90 BASE) MCG/ACT inhaler  Commonly known as:  PROVENTIL HFA;VENTOLIN HFA  Inhale 2 puffs into the lungs every 6 (six) hours as needed for wheezing or shortness of breath.     albuterol (2.5 MG/3ML) 0.083% nebulizer solution  Commonly known as:  PROVENTIL  Take 2.5 mg by nebulization every 6 (six) hours as needed for wheezing or shortness of breath.     atorvastatin 40 MG tablet  Commonly known as:  LIPITOR  Take 1 tablet (40 mg total) by mouth daily.     budesonide 0.25 MG/2ML nebulizer solution  Commonly known as:  PULMICORT  Take 2 mLs (0.25 mg total) by nebulization 2 (two) times daily.     calcitRIOL 0.25 MCG capsule  Commonly known as:  ROCALTROL  Take 0.25 mcg by mouth daily.      carvedilol 25 MG tablet  Commonly known as:  COREG  1 tablet PO BID.     diphenhydrAMINE 25 MG tablet  Commonly known as:  BENADRYL  Take 50 mg by mouth at bedtime as needed for allergies.     furosemide 80 MG tablet  Commonly known as:  LASIX  Take 160 mg by mouth 2 (two) times daily.     hydrALAZINE 25 MG tablet  Commonly known as:  APRESOLINE  Take 1.5 tablets (37.5 mg total) by mouth every 8 (eight) hours.     isosorbide dinitrate 20 MG tablet  Commonly known as:  ISORDIL  TAKE 1 TABLET BY MOUTH THREE TIMES DAILY     LANTUS SOLOSTAR 100 UNIT/ML Solostar Pen  Generic drug:  Insulin Glargine  Inject 20 Units into the skin at bedtime.     NOVOLOG FLEXPEN 100 UNIT/ML FlexPen  Generic drug:  insulin aspart  Inject 12-20 Units into the skin 3 (three) times daily with meals. Per sliding scale - based on carb count and CBG     oxyCODONE 5 MG immediate release tablet  Commonly known as:  ROXICODONE  Take 1 tablet (5 mg total) by mouth every 6 (six) hours as needed for severe pain.     oxyCODONE-acetaminophen 5-325 MG per tablet  Commonly known as:  ROXICET  Take 1-2 tablets by mouth every 4 (four) hours as needed for severe pain.        Diagnostic Studies: Dg Chest Port 1 View  05/10/2015   CLINICAL DATA:  Endotracheal tube placement  EXAM: PORTABLE CHEST - 1 VIEW  COMPARISON:  09/17/2013  FINDINGS: Cardiomegaly is noted. There is mild interstitial prominence bilaterally suspicious for pulmonary edema. No segmental infiltrate. Endotracheal tube in place with tip 1.4 cm above the carina. There is left IJ central line with tip in upper SVC. No pneumothorax.  IMPRESSION: Cardiomegaly. Endotracheal tube in place. Mild interstitial prominence bilateral suspicious for mild pulmonary edema. No segmental infiltrate   Electronically Signed   By: Natasha Mead M.D.   On: 05/10/2015 11:06    Disposition: 01-Home or Self Care      Discharge Instructions    Call MD / Call 911    Complete  by:  As directed   If you experience chest pain or shortness of breath, CALL 911 and be transported to the hospital emergency room.  If you develope a fever above 101 F, pus (white drainage) or increased drainage or redness at the wound, or calf pain, call your surgeon's  office.     Change dressing    Complete by:  As directed   You may change your dressing on day 5, then change the dressing daily with sterile 4 x 4 inch gauze dressing and paper tape.  You may clean the incision with alcohol prior to redressing     Constipation Prevention    Complete by:  As directed   Drink plenty of fluids.  Prune juice may be helpful.  You may use a stool softener, such as Colace (over the counter) 100 mg twice a day.  Use MiraLax (over the counter) for constipation as needed.     Diet - low sodium heart healthy    Complete by:  As directed      Discharge instructions    Complete by:  As directed   Follow up with Dr. Ave Filter as previously discussed.     Driving restrictions    Complete by:  As directed   No driving for 2 weeks     Increase activity slowly as tolerated    Complete by:  As directed      Patient may shower    Complete by:  As directed   You may shower without a dressing once there is no drainage.  Do not wash over the wound.  If drainage remains, cover wound with plastic wrap and then shower.           Follow-up Information    Follow up with Mable Paris, MD. Schedule an appointment as soon as possible for a visit in 1 week.   Specialty:  Orthopedic Surgery   Contact information:   [address] [place] [address] [phone]        Signed: Vear Clock, Teckla Christiansen R 05/13/2015, 3:16 PM

## 2015-05-13 NOTE — Progress Notes (Signed)
Discharge instructions gave to pt and all questions answered. Patient is ready to discharge.

## 2015-05-13 NOTE — Progress Notes (Signed)
Patient  Stated that she need to talk with the cardiologist and she want to go home. Notified the on call cardiologist, The PA Corine Shelter spoke to pt and D/C her tele monitor. The PA said he would let patient go home and sked me to contact the Ortho MD as well.

## 2015-05-29 ENCOUNTER — Ambulatory Visit: Payer: Medicaid Other | Admitting: Cardiovascular Disease

## 2015-06-02 ENCOUNTER — Other Ambulatory Visit: Payer: Self-pay | Admitting: Internal Medicine

## 2015-06-06 ENCOUNTER — Other Ambulatory Visit (HOSPITAL_COMMUNITY): Payer: Self-pay | Admitting: *Deleted

## 2015-06-06 ENCOUNTER — Ambulatory Visit: Payer: Medicaid Other

## 2015-06-06 ENCOUNTER — Other Ambulatory Visit: Payer: Self-pay | Admitting: *Deleted

## 2015-06-06 DIAGNOSIS — Z01810 Encounter for preprocedural cardiovascular examination: Secondary | ICD-10-CM

## 2015-06-06 DIAGNOSIS — T82510D Breakdown (mechanical) of surgically created arteriovenous fistula, subsequent encounter: Secondary | ICD-10-CM

## 2015-06-07 ENCOUNTER — Inpatient Hospital Stay (HOSPITAL_COMMUNITY): Admission: RE | Admit: 2015-06-07 | Payer: Medicaid Other | Source: Ambulatory Visit

## 2015-06-12 ENCOUNTER — Ambulatory Visit (INDEPENDENT_AMBULATORY_CARE_PROVIDER_SITE_OTHER)
Admission: RE | Admit: 2015-06-12 | Discharge: 2015-06-12 | Disposition: A | Discharge status: Home / Self Care | Payer: Medicaid Other | Source: Ambulatory Visit | Attending: Vascular Surgery | Admitting: Vascular Surgery

## 2015-06-12 ENCOUNTER — Ambulatory Visit (HOSPITAL_COMMUNITY)
Admission: RE | Admit: 2015-06-12 | Discharge: 2015-06-12 | Disposition: A | Discharge status: Home / Self Care | Payer: Medicaid Other | Source: Ambulatory Visit | Attending: Vascular Surgery | Admitting: Vascular Surgery

## 2015-06-12 DIAGNOSIS — T82510D Breakdown (mechanical) of surgically created arteriovenous fistula, subsequent encounter: Secondary | ICD-10-CM

## 2015-06-12 DIAGNOSIS — Z01810 Encounter for preprocedural cardiovascular examination: Secondary | ICD-10-CM

## 2015-06-15 ENCOUNTER — Encounter (HOSPITAL_COMMUNITY): Payer: Medicaid Other

## 2015-06-15 ENCOUNTER — Other Ambulatory Visit (HOSPITAL_COMMUNITY): Payer: Medicaid Other

## 2015-06-18 ENCOUNTER — Encounter: Payer: Self-pay | Admitting: Vascular Surgery

## 2015-06-20 ENCOUNTER — Ambulatory Visit: Payer: Medicaid Other | Admitting: Vascular Surgery

## 2015-06-29 ENCOUNTER — Ambulatory Visit: Payer: Medicaid Other | Admitting: Cardiovascular Disease

## 2015-07-04 ENCOUNTER — Encounter (HOSPITAL_COMMUNITY): Payer: Medicaid Other

## 2015-07-04 ENCOUNTER — Ambulatory Visit: Payer: Medicaid Other | Admitting: Vascular Surgery

## 2015-07-20 ENCOUNTER — Encounter: Payer: Self-pay | Admitting: Vascular Surgery

## 2015-07-25 ENCOUNTER — Encounter: Payer: Self-pay | Admitting: Vascular Surgery

## 2015-07-25 ENCOUNTER — Ambulatory Visit (INDEPENDENT_AMBULATORY_CARE_PROVIDER_SITE_OTHER): Payer: Medicaid Other | Admitting: Vascular Surgery

## 2015-07-25 VITALS — BP 148/85 | HR 95 | Ht 68.0 in | Wt 332.6 lb

## 2015-07-25 DIAGNOSIS — N185 Chronic kidney disease, stage 5: Secondary | ICD-10-CM | POA: Diagnosis not present

## 2015-07-25 NOTE — Progress Notes (Signed)
[facility]  Patient name: [name] MRN: [id number] DOB: 1980/12/03 Sex: female  REASON FOR VISIT: Evaluate for new access.  HPI: [name] is a 34 y.o. female who had a left radiocephalic AV fistula placed on 40/98/1191. She had a small 2.5 mm radial artery and a 3.5 mm cephalic vein. This has occluded and she presents for evaluation for new access. She is right-handed. She is not yet on dialysis.he denies any recent uremic symptoms. Specifically she denies nausea, vomiting, fatigue, anorexia, or palpitations.  She is scheduled to see Dr. Eliott Nine this Friday.  Past Medical History  Diagnosis Date  . Hypertension   . Asthma   . Hyperlipidemia   . Pneumonia     "couple times; have it now" (09/15/2013)  . Chronic bronchitis (HCC)     "just about q yr" (09/15/2013)  . Shortness of breath     "just recently; related to the pneumonia" (09/15/2013)  . Anemia   . Migraine     "get them alot" (09/15/2013)  . Arthritis     "left hand" (09/15/2013)  . Chronic kidney disease     "low kidney function" (09/15/2013)  . COPD (chronic obstructive pulmonary disease) (HCC)   . Normal coronary arteries     by cardiac catheterization 09/20/13  . Type 1 diabetes mellitus (HCC)     Family History  Problem Relation Age of Onset  . Diabetes Mother   . Stroke Mother   . Diabetes Maternal Grandmother   . Cancer Paternal Grandmother   . Hypertension Father   . Hyperlipidemia Father   . Cancer - Lung Father     SOCIAL HISTORY: Social History  Substance Use Topics  . Smoking status: Never Smoker   . Smokeless tobacco: Never Used  . Alcohol Use: No    Allergies  Allergen Reactions  . Aspirin Anaphylaxis  . Sulfur Hives  . Tramadol Other (See Comments)    Pt states she feels weird     Current Outpatient Prescriptions  Medication Sig Dispense Refill  . albuterol (PROVENTIL HFA;VENTOLIN HFA) 108 (90 BASE) MCG/ACT inhaler Inhale 2 puffs into the lungs  every 6 (six) hours as needed for wheezing or shortness of breath.     Marland Kitchen albuterol (PROVENTIL) (2.5 MG/3ML) 0.083% nebulizer solution Take 2.5 mg by nebulization every 6 (six) hours as needed for wheezing or shortness of breath.    Marland Kitchen atorvastatin (LIPITOR) 40 MG tablet Take 1 tablet (40 mg total) by mouth daily. (Patient taking differently: Take 40 mg by mouth at bedtime. ) 30 tablet 3  . budesonide (PULMICORT) 0.25 MG/2ML nebulizer solution Take 2 mLs (0.25 mg total) by nebulization 2 (two) times daily. (Patient taking differently: Take 0.25 mg by nebulization 2 (two) times daily as needed (wheezing/shortness of breath). ) 60 mL 12  . calcitRIOL (ROCALTROL) 0.25 MCG capsule Take 0.25 mcg by mouth daily.    . carvedilol (COREG) 25 MG tablet 1 tablet PO BID. (Patient taking differently: Take 25 mg by mouth 2 (two) times daily with a meal. ) 60 tablet 0  . diphenhydrAMINE (BENADRYL) 25 MG tablet Take 50 mg by mouth at bedtime as needed for allergies.    . furosemide (LASIX) 80 MG tablet Take 160 mg by mouth 2 (two) times daily.     . hydrALAZINE (APRESOLINE) 25 MG tablet Take 1.5 tablets (37.5 mg total) by mouth every 8 (eight) hours. 120 tablet 0  . insulin aspart (NOVOLOG FLEXPEN) 100 UNIT/ML FlexPen  Inject 12-20 Units into the skin 3 (three) times daily with meals. Per sliding scale - based on carb count and CBG    . Insulin Glargine (LANTUS SOLOSTAR) 100 UNIT/ML Solostar Pen Inject 20 Units into the skin at bedtime.    . isosorbide dinitrate (ISORDIL) 20 MG tablet TAKE 1 TABLET BY MOUTH THREE TIMES DAILY (Patient taking differently: Take 20 mg by mouth 3 (three) times daily. ) 270 tablet 0  . oxyCODONE (ROXICODONE) 5 MG immediate release tablet Take 1 tablet (5 mg total) by mouth every 6 (six) hours as needed for severe pain. 20 tablet 0  . oxyCODONE-acetaminophen (ROXICET) 5-325 MG per tablet Take 1-2 tablets by mouth every 4 (four) hours as needed for severe pain. 60 tablet 0   No current  facility-administered medications for this visit.    REVIEW OF SYSTEMS:  [X]  denotes positive finding, [ ]  denotes negative finding Cardiac  Comments:  Chest pain or chest pressure:    Shortness of breath upon exertion:    Short of breath when lying flat:    Irregular heart rhythm:        Vascular    Pain in calf, thigh, or hip brought on by ambulation:    Pain in feet at night that wakes you up from your sleep:     Blood clot in your veins:    Leg swelling:         Pulmonary    Oxygen at home:    Productive cough:     Wheezing:         Neurologic    Sudden weakness in arms or legs:     Sudden numbness in arms or legs:     Sudden onset of difficulty speaking or slurred speech:    Temporary loss of vision in one eye:     Problems with dizziness:         Gastrointestinal    Blood in stool:     Vomited blood:         Genitourinary    Burning when urinating:     Blood in urine:        Psychiatric    Major depression:         Hematologic    Bleeding problems:    Problems with blood clotting too easily:        Skin    Rashes or ulcers:        Constitutional    Fever or chills:      PHYSICAL EXAM: Filed Vitals:   07/25/15 1019 07/25/15 1021  BP: 151/79 148/85  Pulse: 95   Height: 5\' 8"  (1.727 m)   Weight: 332 lb 9.6 oz (150.866 kg)   SpO2: 95%     GENERAL: The patient is a well-nourished female, in no acute distress. The vital signs are documented above. CARDIAC: There is a regular rate and rhythm.  VASCULAR: she has a palpable radial pulse on the right. He has a diminished radial pulse on the left. Her left radiocephalic fistula does not have a bruit or thrill. PULMONARY: There is good air exchange bilaterally without wheezing or rales. ABDOMEN: Soft and non-tender with normal pitched bowel sounds.  MUSCULOSKELETAL: There are no major deformities or cyanosis. NEUROLOGIC: No focal weakness or paresthesias are detected. SKIN: There are no ulcers or rashes  noted. PSYCHIATRIC: The patient has a normal affect.  DATA:  I have reviewed her previous vein map studies. Her only remaining option on the left  for a fistula would be a basilic vein transposition. This vein is marginal. The veins in the right arm do not look any better.  MEDICAL ISSUES:  STAGE IV CHRONIC KIDNEY DISEASE:   STAGE IV CHRONIC KIDNEY DISEASE: I have recommended that we explore her basilic vein on the left and if this is adequate perform a basilic vein transposition. If this is not adequately place an AV graft. I do not think the veins in the right arm look any better. She does not want to schedule her surgery today as she needs to speak to her husband about the schedule. In addition she scheduled to see Dr. Eliott Nine on Friday. Knowning that she may require an AV graft she may want [place] to hold off for now. However the last GFR IC is 16 so if the basilic vein is not adequate I would likely place an AV graft.  HYPERTENSION: The patient's initial blood pressure today was elevated. We repeated this and this was still elevated. We have encouraged the patient to follow up with their primary care physician for management of their blood pressure.   Waverly Ferrari [facility] [place] Beeper: [phone]

## 2015-07-31 ENCOUNTER — Ambulatory Visit: Payer: Medicaid Other | Admitting: Cardiovascular Disease

## 2015-08-03 ENCOUNTER — Encounter (HOSPITAL_COMMUNITY): Payer: Medicaid Other

## 2015-09-10 ENCOUNTER — Other Ambulatory Visit: Payer: Self-pay

## 2015-10-03 ENCOUNTER — Encounter (HOSPITAL_COMMUNITY): Payer: Self-pay | Admitting: Emergency Medicine

## 2015-10-03 MED ORDER — CHLORHEXIDINE GLUCONATE CLOTH 2 % EX PADS: 6.0000 | MEDICATED_PAD | Freq: Once | CUTANEOUS | Status: DC

## 2015-10-03 MED ORDER — DEXTROSE 5 % IV SOLN: 1.5000 g | INTRAVENOUS | Status: DC

## 2015-10-03 MED ORDER — SODIUM CHLORIDE 0.9 % IV SOLN: INTRAVENOUS | Status: DC

## 2015-10-03 NOTE — Progress Notes (Signed)
Anesthesia Chart Review:  Pt is 35 year old female scheduled for L AV fistula creation vs basilic vein transposition vs graft insertion on 10/04/2015 with Dr. Edilia Bo.   Pt is a same day work up.   PMH includes:  Post-op NSTEMI (04/2015), non-ischemic cardiomyopathy, moderate to severe pulmonary HTN, combined systolic and diastolic HF, HTN, hyperlipidemia, anemia, CKD (stage V), COPD, type 1 DM, asthma. Never smoker. BMI 50.5. S/p R shoulder arthroscopy 05/10/15.   Pt first diagnosed with non-ischemic cardiomyopathy in 08/2013 hospitalization. Did not follow up again with cardiology until 04/2015 when we (anesthesiology) required a pre-op cardiology eval prior to shoulder arthroscopy. She was cleared by Dr. Allyson Sabal at increased CV risk perioperatively after repeat echo (see results below) demonstrated EF 35-40% and moderate to severe pulmonary hypertension. Peri-op 05/10/15 pt had unexpected complication of hemidiaphragm palsy and pt was kept overnight. Pt then developed chest pain some time in the early morning hours of 05/11/15 associated with elevated troponin and HTN urgency. Peak troponin was 11.2, NSTEMI type 2 suspected. Cath deferred due to CKD. She was to follow up with Dr. Allyson Sabal with cardiology within one week of discharge but she has not followed up with cardiology. She was also discharged on plavix but this is not currently listed on her medication list.   Medications include: albuterol, lipitor, pulmicort, carvedilol, lasix, hydralazine, novolog, isordil.   Pt will need labs DOS.   1 view CXR 05/10/15: Cardiomegaly. Endotracheal tube in place. Mild interstitial prominence bilateral suspicious for mild pulmonary edema. No segmental infiltrate  EKG 05/13/15: Sinus tachycardia (112 bpm). Marked ST abnormality, possible inferior subendocardial injury.   Echo 05/07/15:  - Left ventricle: There is akinesis of the basal and mid inferior, inferoseptal, anteroseptal walls. The cavity size was mildly  dilated. There was mild concentric hypertrophy. Systolic function was moderately reduced. The estimated ejection fraction was in the range of 35% to 40%. Features are consistent with a pseudonormal left ventricular filling pattern, with concomitant abnormal relaxation and increased filling pressure (grade 2 diastolic dysfunction). Doppler parameters are consistent with elevated ventricular end-diastolic filling pressure. - Aortic valve: Trileaflet; normal thickness leaflets. Transvalvular velocity was within the normal range. There was no stenosis. There was no regurgitation. - Aortic root: The aortic root was normal in size. - Mitral valve: Structurally normal valve. There was mild regurgitation. - Left atrium: The atrium was mildly dilated. - Right ventricle: The cavity size was normal. Wall thickness was normal. Systolic function was normal. - Tricuspid valve: There was mild regurgitation. - Pulmonary arteries: Systolic pressure was severely increased. PA peak pressure: 62 mm Hg (S). - Pericardium, extracardiac: A mild pericardial effusion was identified posterior to the heart. Features were not consistent with tamponade physiology. - Impressions: LV endocardium is poorly visualized. There appears to be nosignificant difference in LVEF since the last study on 09/16/2013. Echocontrast should be considered for further evaluation. There is severe pulmonary hypertension.  Cardiac cath 09/20/2013: 1. Left main; normal  2. LAD; minor regularities 3. Left circumflex; nondominant with minor irregularities.  4. Right coronary artery; dominant with minor irregularities 5. 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 I believe her Myoview was false positive probably related to breast attenuation artifact. I do not know the etiology of her LV dysfunction but I suspect is related to her  diabetes and/or idiopathic. Continue medical therapy will be recommended. Given the degree of LV dysfunction she  may be a candidate for a "LifeVest " as a bridge to potential ICD therapy for primary prevention  Reviewed case with Dr. Hart Rochester. Given that pt had NSTEMI post-op 04/2015 and has not followed up with cardiology since, pt will need cardiac eval prior to surgery. Notified Stephanie in Dr. Adele Dan office.   Rica Mast, FNP-BC [facility]/Anesthesiology Phone: [phone] 10/03/2015 2:02 PM

## 2015-10-04 ENCOUNTER — Ambulatory Visit (HOSPITAL_COMMUNITY): Admission: RE | Admit: 2015-10-04 | Payer: Medicaid Other | Source: Ambulatory Visit | Admitting: Vascular Surgery

## 2015-10-04 ENCOUNTER — Encounter (HOSPITAL_COMMUNITY): Admission: RE | Payer: Self-pay | Source: Ambulatory Visit

## 2015-10-04 SURGERY — ARTERIOVENOUS (AV) FISTULA CREATION
Anesthesia: Monitor Anesthesia Care | Laterality: Left

## 2015-10-10 ENCOUNTER — Encounter: Payer: Medicaid Other | Admitting: Physician Assistant

## 2015-10-10 NOTE — Progress Notes (Signed)
    Cardiology Office Note   Date:  10/10/2015   ID:  Jeannemarie Kapur, DOB [birth year], MRN [id number]  PCP:  [name], FNP  Cardiologist:  Dr William Hamburger, PA-C   No chief complaint on file.   History of Present Illness: Genavie Louck is a 35 y.o. female with a history of CKD V (fistula created 01/05), HTN, HL, DM1, S-D-CHF (EF of 35-40% with grade 2 dd by echo on 05/07/15), pulmonary HTN (RVSP ), COPD, nl cors 2014.     This encounter was created in error - please disregard.

## 2015-10-12 ENCOUNTER — Encounter: Payer: Self-pay | Admitting: Cardiology

## 2015-10-12 ENCOUNTER — Ambulatory Visit (INDEPENDENT_AMBULATORY_CARE_PROVIDER_SITE_OTHER): Payer: Medicaid Other | Admitting: Cardiology

## 2015-10-12 VITALS — BP 138/78 | HR 93 | Ht 68.0 in | Wt 330.4 lb

## 2015-10-12 DIAGNOSIS — Z01818 Encounter for other preprocedural examination: Secondary | ICD-10-CM | POA: Diagnosis not present

## 2015-10-12 DIAGNOSIS — I10 Essential (primary) hypertension: Secondary | ICD-10-CM

## 2015-10-12 DIAGNOSIS — N185 Chronic kidney disease, stage 5: Secondary | ICD-10-CM

## 2015-10-12 DIAGNOSIS — IMO0001 Reserved for inherently not codable concepts without codable children: Secondary | ICD-10-CM

## 2015-10-12 DIAGNOSIS — Z0389 Encounter for observation for other suspected diseases and conditions ruled out: Secondary | ICD-10-CM | POA: Diagnosis not present

## 2015-10-12 DIAGNOSIS — I429 Cardiomyopathy, unspecified: Secondary | ICD-10-CM

## 2015-10-12 NOTE — Assessment & Plan Note (Signed)
Followed by Dr Kathrene Bongo

## 2015-10-12 NOTE — Progress Notes (Signed)
10/12/2015 [name]   [birth year]  [id number]  Primary Physician ANDERSON,TERESA, FNP Primary Cardiologist: Dr Allyson Sabal  HPI:  35 year old morbidly obese AA female with hypertension, DM, stage 5 CRI and h/o false positive Myoview 2014 with mild CAD then. She was admitted 05/10/15 due to right shoulder labral tears that failed medical management. She underwent surgery 05/10/15. Post op she had chest pain. Subsequent Troponin peaked at 11, EF of 35-40% with grade 2 diastolic dysfunction by 2-D echo on 05/07/15, pulmonary HTN (RVSP ). She never followed up with cardiology. She was to have a n AVF placed and anesthesia suggested she be cleared from a cardiology standpoint. The pt denies any chest pain or dyspnea.     Current Outpatient Prescriptions  Medication Sig Dispense Refill  . albuterol (PROVENTIL HFA;VENTOLIN HFA) 108 (90 BASE) MCG/ACT inhaler Inhale 2 puffs into the lungs every 6 (six) hours as needed for wheezing or shortness of breath.     Marland Kitchen albuterol (PROVENTIL) (2.5 MG/3ML) 0.083% nebulizer solution Take 2.5 mg by nebulization every 6 (six) hours as needed for wheezing or shortness of breath.    Marland Kitchen atorvastatin (LIPITOR) 40 MG tablet Take 40 mg by mouth at bedtime.    . budesonide (PULMICORT) 0.25 MG/2ML nebulizer solution Take 0.25 mg by nebulization 2 (two) times daily as needed (shortness of breath or wheezing).    . calcitRIOL (ROCALTROL) 0.25 MCG capsule Take 0.25 mcg by mouth daily.    . carvedilol (COREG) 25 MG tablet Take 25 mg by mouth 2 (two) times daily.    . diphenhydrAMINE (BENADRYL) 25 MG tablet Take 50 mg by mouth at bedtime as needed for allergies.    . furosemide (LASIX) 80 MG tablet Take 240 mg by mouth 2 (two) times daily.     . hydrALAZINE (APRESOLINE) 25 MG tablet Take 1.5 tablets (37.5 mg total) by mouth every 8 (eight) hours. 120 tablet 0  . insulin aspart (NOVOLOG FLEXPEN) 100 UNIT/ML FlexPen Inject 12-20 Units into the skin 3 (three) times daily with meals.  Per sliding scale - based on carb count and CBG    . Insulin Glargine (LANTUS SOLOSTAR) 100 UNIT/ML Solostar Pen Inject 20 Units into the skin at bedtime.    . isosorbide dinitrate (ISORDIL) 20 MG tablet Take 20 mg by mouth 3 (three) times daily.     No current facility-administered medications for this visit.    Allergies  Allergen Reactions  . Aspirin Anaphylaxis  . Sulfur Hives  . Tramadol Other (See Comments)    Pt states she feels weird     Social History   Social History  . Marital Status: Single    Spouse Name: N/A  . Number of Children: 2  . Years of Education: N/A   Occupational History  . Student    Social History Main Topics  . Smoking status: Never Smoker   . Smokeless tobacco: Never Used  . Alcohol Use: No  . Drug Use: No  . Sexual Activity:    Partners: Male    Birth Control/ Protection: Other-see comments     Comment: S/P tubal ligation   Other Topics Concern  . Not on file   Social History Narrative   Single.  Moved here from [place].  Lives with 2 children.       Review of Systems: General: negative for chills, fever, night sweats or weight changes.  Cardiovascular: negative for chest pain, dyspnea on exertion, edema, orthopnea, palpitations, paroxysmal nocturnal dyspnea or  shortness of breath Dermatological: negative for rash Respiratory: negative for cough or wheezing Urologic: negative for hematuria Abdominal: negative for nausea, vomiting, diarrhea, bright red blood per rectum, melena, or hematemesis Neurologic: negative for visual changes, syncope, or dizziness All other systems reviewed and are otherwise negative except as noted above.    Blood pressure 138/78, pulse 93, height 5\' 8"  (1.727 m), weight 330 lb 6.4 oz (149.868 kg).  General appearance: alert, cooperative, no distress and moderately obese Neck: no carotid bruit and no JVD Lungs: clear to auscultation bilaterally Heart: regular rate and rhythm Skin: pale, cool,  dry Neurologic: Grossly normal  EKG NSR, TWI 1,2,3,F, V4-V6  ASSESSMENT AND PLAN:   Pre-operative clearance Referred by anesthesia for pre op clearance prior to AVF placement  NSTEMI- type 2, Troponin 11.01 May 2015 after shoulder surgery  Normal coronary arteries 2014 after false positive Myoview  Hypertension Controlled  CKD (chronic kidney disease) stage 5, GFR less than 15 ml/min (HCC) Followed by Dr Kathrene Bongo  Morbid obesity-  BMI 51   PLAN  Discussed with Dr Jens Som, will get Lexiscan and repeat echo prior to granting clearnce.   Corine Shelter K PA-C 10/12/2015 2:14 PM

## 2015-10-12 NOTE — Assessment & Plan Note (Signed)
Aug 2016 after shoulder surgery

## 2015-10-12 NOTE — Patient Instructions (Signed)
Medication Instructions:  Please continue your current medications  Labwork: NONE  Testing/Procedures: 1. Echocardiogram - Your physician has requested that you have an echocardiogram. Echocardiography is a painless test that uses sound waves to create images of your heart. It provides your doctor with information about the size and shape of your heart and how well your heart's chambers and valves are working. This procedure takes approximately one hour. There are no restrictions for this procedure.  2. Eugenie Birks - Your physician has requested that you have a lexiscan myoview. For further information please visit [email or web address] Please follow instruction sheet, as given.  Follow-Up: Corine Shelter, [place], recommends that you schedule a follow-up appointment in 3 months with Dr Allyson Sabal. You will receive a reminder letter in the mail two months in advance. If you don't receive a letter, please call our office to schedule the follow-up appointment.  If you need a refill on your cardiac medications before your next appointment, please call your pharmacy.

## 2015-10-12 NOTE — Assessment & Plan Note (Signed)
Controlled.  

## 2015-10-12 NOTE — Assessment & Plan Note (Signed)
2014 after false positive Myoview

## 2015-10-12 NOTE — Assessment & Plan Note (Signed)
Referred by anesthesia for pre op clearance prior to AVF placement

## 2015-10-12 NOTE — Assessment & Plan Note (Signed)
BMI 51 

## 2015-10-23 ENCOUNTER — Other Ambulatory Visit: Payer: Self-pay

## 2015-10-23 ENCOUNTER — Ambulatory Visit (HOSPITAL_COMMUNITY): Payer: Medicaid Other | Attending: Cardiovascular Disease

## 2015-10-23 DIAGNOSIS — I071 Rheumatic tricuspid insufficiency: Secondary | ICD-10-CM | POA: Insufficient documentation

## 2015-10-23 DIAGNOSIS — E119 Type 2 diabetes mellitus without complications: Secondary | ICD-10-CM | POA: Diagnosis not present

## 2015-10-23 DIAGNOSIS — R29898 Other symptoms and signs involving the musculoskeletal system: Secondary | ICD-10-CM | POA: Diagnosis not present

## 2015-10-23 DIAGNOSIS — I429 Cardiomyopathy, unspecified: Secondary | ICD-10-CM | POA: Diagnosis not present

## 2015-10-23 DIAGNOSIS — I517 Cardiomegaly: Secondary | ICD-10-CM | POA: Diagnosis not present

## 2015-10-23 DIAGNOSIS — Z01818 Encounter for other preprocedural examination: Secondary | ICD-10-CM | POA: Insufficient documentation

## 2015-10-23 DIAGNOSIS — I371 Nonrheumatic pulmonary valve insufficiency: Secondary | ICD-10-CM | POA: Diagnosis not present

## 2015-10-23 DIAGNOSIS — I10 Essential (primary) hypertension: Secondary | ICD-10-CM | POA: Insufficient documentation

## 2015-10-23 DIAGNOSIS — E785 Hyperlipidemia, unspecified: Secondary | ICD-10-CM | POA: Diagnosis not present

## 2015-10-23 DIAGNOSIS — I313 Pericardial effusion (noninflammatory): Secondary | ICD-10-CM | POA: Insufficient documentation

## 2015-10-25 ENCOUNTER — Telehealth: Payer: Self-pay | Admitting: Cardiovascular Disease

## 2015-10-25 NOTE — Telephone Encounter (Signed)
New message  ° ° °Patient calling back to speak with nurse  °

## 2015-10-25 NOTE — Telephone Encounter (Signed)
Patient called echo results given.Stated she needed to reschedule myoview.Advised schedulers will call back to schedule.

## 2015-10-26 ENCOUNTER — Encounter (HOSPITAL_COMMUNITY)
Admission: RE | Admit: 2015-10-26 | Discharge: 2015-10-26 | Disposition: A | Discharge status: Home / Self Care | Payer: Medicaid Other | Source: Ambulatory Visit | Attending: Nephrology | Admitting: Nephrology

## 2015-10-26 ENCOUNTER — Encounter: Payer: Self-pay | Admitting: Nurse Practitioner

## 2015-10-26 DIAGNOSIS — N183 Chronic kidney disease, stage 3 (moderate): Secondary | ICD-10-CM | POA: Insufficient documentation

## 2015-10-26 DIAGNOSIS — D631 Anemia in chronic kidney disease: Secondary | ICD-10-CM | POA: Diagnosis not present

## 2015-10-26 DIAGNOSIS — Z5181 Encounter for therapeutic drug level monitoring: Secondary | ICD-10-CM | POA: Diagnosis not present

## 2015-10-26 DIAGNOSIS — Z79899 Other long term (current) drug therapy: Secondary | ICD-10-CM | POA: Insufficient documentation

## 2015-10-26 DIAGNOSIS — D509 Iron deficiency anemia, unspecified: Secondary | ICD-10-CM | POA: Diagnosis not present

## 2015-10-26 LAB — RENAL FUNCTION PANEL
ALBUMIN: 2.8 g/dL — AB (ref 3.5–5.0)
ANION GAP: 13 (ref 5–15)
BUN: 68 mg/dL — ABNORMAL HIGH (ref 6–20)
CHLORIDE: 105 mmol/L (ref 101–111)
CO2: 20 mmol/L — ABNORMAL LOW (ref 22–32)
Calcium: 7.4 mg/dL — ABNORMAL LOW (ref 8.9–10.3)
Creatinine, Ser: 8.81 mg/dL — ABNORMAL HIGH (ref 0.44–1.00)
GFR calc Af Amer: 6 mL/min — ABNORMAL LOW (ref >60)
GFR calc non Af Amer: 5 mL/min — ABNORMAL LOW (ref >60)
GLUCOSE: 167 mg/dL — AB (ref 65–99)
PHOSPHORUS: 7.7 mg/dL — AB (ref 2.5–4.6)
POTASSIUM: 4.4 mmol/L (ref 3.5–5.1)
Sodium: 138 mmol/L (ref 135–145)

## 2015-10-26 LAB — FERRITIN: Ferritin: 347 ng/mL — ABNORMAL HIGH (ref 11–307)

## 2015-10-26 LAB — POCT HEMOGLOBIN-HEMACUE: Hemoglobin: 8.2 g/dL — ABNORMAL LOW (ref 12.0–15.0)

## 2015-10-26 LAB — IRON AND TIBC
Iron: 53 ug/dL (ref 28–170)
SATURATION RATIOS: 22 % (ref 10.4–31.8)
TIBC: 245 ug/dL — ABNORMAL LOW (ref 250–450)
UIBC: 192 ug/dL

## 2015-10-26 MED ORDER — EPOETIN ALFA 20000 UNIT/ML IJ SOLN
INTRAMUSCULAR | Status: AC
  Administered 2015-10-26: 20000 [IU] via SUBCUTANEOUS
  Filled 2015-10-26: qty 1

## 2015-10-26 MED ORDER — SODIUM CHLORIDE 0.9 % IV SOLN
510.0000 mg | INTRAVENOUS | Status: DC
  Administered 2015-10-26: 510 mg via INTRAVENOUS
  Filled 2015-10-26: qty 17

## 2015-10-26 MED ORDER — EPOETIN ALFA 20000 UNIT/ML IJ SOLN
20000.0000 [IU] | INTRAMUSCULAR | Status: DC
  Administered 2015-10-26: 20000 [IU] via SUBCUTANEOUS

## 2015-10-27 LAB — PTH, INTACT AND CALCIUM
Calcium, Total (PTH): 7 mg/dL — ABNORMAL LOW (ref 8.7–10.2)
PTH: 245 pg/mL — AB (ref 15–65)

## 2015-10-30 ENCOUNTER — Inpatient Hospital Stay (HOSPITAL_COMMUNITY): Admission: RE | Admit: 2015-10-30 | Payer: Medicaid Other | Source: Ambulatory Visit

## 2015-10-31 ENCOUNTER — Ambulatory Visit (HOSPITAL_COMMUNITY): Payer: Medicaid Other

## 2015-11-02 ENCOUNTER — Telehealth (HOSPITAL_COMMUNITY): Payer: Self-pay

## 2015-11-02 ENCOUNTER — Encounter (HOSPITAL_COMMUNITY)
Admission: RE | Admit: 2015-11-02 | Discharge: 2015-11-02 | Disposition: A | Discharge status: Home / Self Care | Payer: Medicaid Other | Source: Ambulatory Visit | Attending: Nephrology | Admitting: Nephrology

## 2015-11-02 DIAGNOSIS — N183 Chronic kidney disease, stage 3 (moderate): Secondary | ICD-10-CM | POA: Insufficient documentation

## 2015-11-02 DIAGNOSIS — D631 Anemia in chronic kidney disease: Secondary | ICD-10-CM | POA: Diagnosis not present

## 2015-11-02 DIAGNOSIS — Z5181 Encounter for therapeutic drug level monitoring: Secondary | ICD-10-CM | POA: Diagnosis not present

## 2015-11-02 DIAGNOSIS — Z79899 Other long term (current) drug therapy: Secondary | ICD-10-CM | POA: Insufficient documentation

## 2015-11-02 MED ORDER — SODIUM CHLORIDE 0.9 % IV SOLN
510.0000 mg | INTRAVENOUS | Status: DC
  Administered 2015-11-02: 510 mg via INTRAVENOUS
  Filled 2015-11-02: qty 17

## 2015-11-02 NOTE — Telephone Encounter (Signed)
Encounter complete. 

## 2015-11-07 ENCOUNTER — Ambulatory Visit (HOSPITAL_COMMUNITY)
Admission: RE | Admit: 2015-11-07 | Discharge: 2015-11-07 | Disposition: A | Discharge status: Home / Self Care | Payer: Medicaid Other | Source: Ambulatory Visit | Attending: Internal Medicine | Admitting: Internal Medicine

## 2015-11-07 DIAGNOSIS — Z6841 Body Mass Index (BMI) 40.0 and over, adult: Secondary | ICD-10-CM | POA: Diagnosis not present

## 2015-11-07 DIAGNOSIS — R9439 Abnormal result of other cardiovascular function study: Secondary | ICD-10-CM | POA: Insufficient documentation

## 2015-11-07 DIAGNOSIS — Z8249 Family history of ischemic heart disease and other diseases of the circulatory system: Secondary | ICD-10-CM | POA: Insufficient documentation

## 2015-11-07 DIAGNOSIS — I10 Essential (primary) hypertension: Secondary | ICD-10-CM | POA: Insufficient documentation

## 2015-11-07 DIAGNOSIS — E669 Obesity, unspecified: Secondary | ICD-10-CM | POA: Insufficient documentation

## 2015-11-07 DIAGNOSIS — I517 Cardiomegaly: Secondary | ICD-10-CM | POA: Insufficient documentation

## 2015-11-07 DIAGNOSIS — E119 Type 2 diabetes mellitus without complications: Secondary | ICD-10-CM | POA: Insufficient documentation

## 2015-11-07 DIAGNOSIS — Z01818 Encounter for other preprocedural examination: Secondary | ICD-10-CM | POA: Insufficient documentation

## 2015-11-07 DIAGNOSIS — I429 Cardiomyopathy, unspecified: Secondary | ICD-10-CM | POA: Insufficient documentation

## 2015-11-07 MED ORDER — TECHNETIUM TC 99M SESTAMIBI GENERIC - CARDIOLITE
29.0000 | Freq: Once | INTRAVENOUS | Status: AC | PRN
  Administered 2015-11-07: 29 via INTRAVENOUS

## 2015-11-07 MED ORDER — REGADENOSON 0.4 MG/5ML IV SOLN
0.4000 mg | Freq: Once | INTRAVENOUS | Status: AC
  Administered 2015-11-07: 0.4 mg via INTRAVENOUS

## 2015-11-07 MED ORDER — AMINOPHYLLINE 25 MG/ML IV SOLN
75.0000 mg | Freq: Once | INTRAVENOUS | Status: AC
  Administered 2015-11-07: 75 mg via INTRAVENOUS

## 2015-11-08 ENCOUNTER — Ambulatory Visit (HOSPITAL_COMMUNITY)
Admission: RE | Admit: 2015-11-08 | Discharge: 2015-11-08 | Disposition: A | Discharge status: Home / Self Care | Payer: Medicaid Other | Source: Ambulatory Visit | Attending: Cardiovascular Disease | Admitting: Cardiovascular Disease

## 2015-11-08 DIAGNOSIS — E119 Type 2 diabetes mellitus without complications: Secondary | ICD-10-CM | POA: Insufficient documentation

## 2015-11-08 DIAGNOSIS — Z6841 Body Mass Index (BMI) 40.0 and over, adult: Secondary | ICD-10-CM | POA: Diagnosis not present

## 2015-11-08 DIAGNOSIS — I10 Essential (primary) hypertension: Secondary | ICD-10-CM | POA: Insufficient documentation

## 2015-11-08 DIAGNOSIS — Z8249 Family history of ischemic heart disease and other diseases of the circulatory system: Secondary | ICD-10-CM | POA: Insufficient documentation

## 2015-11-08 DIAGNOSIS — R9439 Abnormal result of other cardiovascular function study: Secondary | ICD-10-CM | POA: Insufficient documentation

## 2015-11-08 DIAGNOSIS — I429 Cardiomyopathy, unspecified: Secondary | ICD-10-CM | POA: Diagnosis not present

## 2015-11-08 DIAGNOSIS — Z01818 Encounter for other preprocedural examination: Secondary | ICD-10-CM | POA: Insufficient documentation

## 2015-11-08 DIAGNOSIS — E669 Obesity, unspecified: Secondary | ICD-10-CM | POA: Insufficient documentation

## 2015-11-08 DIAGNOSIS — I517 Cardiomegaly: Secondary | ICD-10-CM | POA: Diagnosis not present

## 2015-11-08 LAB — MYOCARDIAL PERFUSION IMAGING
LV dias vol: 221 mL
LV sys vol: 170 mL
Peak HR: 99 {beats}/min
Rest HR: 90 {beats}/min
SDS: 1
SRS: 0
SSS: 1
TID: 1.03

## 2015-11-08 MED ORDER — TECHNETIUM TC 99M SESTAMIBI GENERIC - CARDIOLITE
30.1000 | Freq: Once | INTRAVENOUS | Status: AC | PRN
  Administered 2015-11-08: 30.1 via INTRAVENOUS

## 2015-11-09 ENCOUNTER — Inpatient Hospital Stay (HOSPITAL_COMMUNITY): Admission: RE | Admit: 2015-11-09 | Payer: Medicaid Other | Source: Ambulatory Visit

## 2015-11-09 NOTE — Progress Notes (Signed)
Her Myoview, despite being high risk, is not significantly different than the one performed in 2014 which led to heart cath that revealed only minor irregularities in her coronary circulation with diminished LV function consistent with nonischemic cardiomyopathy. Her EF currently is in the 35% range. I believe she should be cleared for her upcoming shoulder surgery at moderate risk due to LV dysfunction but should be allowed to have this done.

## 2015-11-14 ENCOUNTER — Other Ambulatory Visit: Payer: Self-pay

## 2015-11-16 ENCOUNTER — Encounter (HOSPITAL_COMMUNITY)
Admission: RE | Admit: 2015-11-16 | Discharge: 2015-11-16 | Disposition: A | Discharge status: Home / Self Care | Payer: Medicaid Other | Source: Ambulatory Visit | Attending: Nephrology | Admitting: Nephrology

## 2015-11-16 DIAGNOSIS — N183 Chronic kidney disease, stage 3 (moderate): Secondary | ICD-10-CM | POA: Diagnosis not present

## 2015-11-16 LAB — POCT HEMOGLOBIN-HEMACUE: Hemoglobin: 8.2 g/dL — ABNORMAL LOW (ref 12.0–15.0)

## 2015-11-16 MED ORDER — EPOETIN ALFA 20000 UNIT/ML IJ SOLN
INTRAMUSCULAR | Status: AC
  Filled 2015-11-16: qty 1

## 2015-11-16 MED ORDER — EPOETIN ALFA 20000 UNIT/ML IJ SOLN
20000.0000 [IU] | INTRAMUSCULAR | Status: DC
  Administered 2015-11-16: 20000 [IU] via SUBCUTANEOUS

## 2015-11-21 ENCOUNTER — Encounter (HOSPITAL_COMMUNITY): Payer: Self-pay | Admitting: *Deleted

## 2015-11-21 NOTE — Progress Notes (Signed)
Anesthesia Chart Review:  Pt is 35 year old female scheduled for L AV fistula creation vs basilic vein transposition vs graft insertion on 11/23/15 with Dr. Edilia Bo.   Pt is a same day work up.   PMH includes: Post-op NSTEMI (04/2015), non-ischemic cardiomyopathy, moderate to severe pulmonary HTN, combined systolic and diastolic HF, HTN, hyperlipidemia, anemia, CKD (stage V), COPD, type 1 DM, asthma. Never smoker. BMI 50.5. S/p R shoulder arthroscopy 05/10/15.   Pt first diagnosed with non-ischemic cardiomyopathy in 08/2013 hospitalization. Did not follow up again with cardiology until 04/2015 when we (anesthesiology) required a pre-op cardiology eval prior to shoulder arthroscopy. She was cleared by Dr. Allyson Sabal at increased CV risk perioperatively after repeat echo (see results below) demonstrated EF 35-40% and moderate to severe pulmonary hypertension. Peri-op 05/10/15 pt had unexpected complication of hemidiaphragm palsy and pt was kept overnight. Pt then developed chest pain some time in the early morning hours of 05/11/15 associated with elevated troponin and HTN urgency. Peak troponin was 11.2, NSTEMI type 2 suspected. Cath deferred due to CKD. She was to follow up with Dr. Allyson Sabal with cardiology within one week of discharge but she has not followed up with cardiology. She was also discharged on plavix but this is not currently listed on her medication list.   Medications include: albuterol, lipitor, pulmicort, carvedilol, lasix, hydralazine, novolog, lantus, isordil.   Pt will need labs DOS.   1 view CXR 05/10/15: Cardiomegaly. Endotracheal tube in place. Mild interstitial prominence bilateral suspicious for mild pulmonary edema. No segmental infiltrate  EKG 10/12/15: NSR. ST & T abnormality, consider inferolateral ischemia. Prolonged QT.   Nuclear stress test 11/08/15:   Nuclear stress EF: 23%.  The left ventricular ejection fraction is severely decreased (<30%).  There was no ST segment  deviation noted during stress.  No T wave inversion was noted during stress.  Defect 1: There is a medium defect of mild severity present in the basal anterior, mid anterior and apex location.  This is a high risk study. High risk study due to severe global LV dysfunction. Ischemia is not seen. Poor quality images due to body habitus. Patchy anterior wall defects likely represent breast attenuation artifact. Findings suggest nonischemic cardiomyopathy.   Echo 10/23/15:  - Left ventricle: The cavity size was moderately dilated. There was moderate hypertrophy of the posterior wall. Systolic function was moderately reduced. The estimated ejection fraction was in the range of 35% to 40%. Diffuse hypokinesis with akinesis of the inferior and inferolateral walls. Features are consistent with a pseudonormal left ventricular filling pattern, with concomitant abnormal relaxation and increased filling pressure (grade 2 diastolic dysfunction). Doppler parameters are consistent with high ventricular filling pressure. - Aortic valve: Transvalvular velocity was within the normal range. There was no stenosis. There was no regurgitation. - Right ventricle: The cavity size was mildly dilated. Wall thickness was normal. Systolic function was normal. - Tricuspid valve: There was mild regurgitation. - Pulmonic valve: There was mild regurgitation. - Inferior vena cava: The vessel was dilated. The respirophasic diameter changes were blunted (< 50%), consistent with elevated central venous pressure. - Pericardium, extracardiac: A moderate pericardial effusion was identified circumferential to the heart. Hemodynamic compromise is unlikely but cannot be ruled out, as pulmonary pressures are elevated. There was no chamber collapse. There was evidence for mildly increased RV-LV interaction demonstrated by respirophasic changes in tricuspid velocities.  Cardiac cath 09/20/2013: 1. Left main; normal  2. LAD; minor  regularities 3. Left circumflex; nondominant with minor irregularities.  4. Right coronary artery; dominant with minor irregularities 5. Left ventriculography; not performed today to conserve contrast IMPRESSION: Ms. Tuss has noncritical CAD with moderate minor irregularities in all 3 coronary arteries. I believe her Myoview was false positive probably related to breast attenuation artifact. I do not know the etiology of her LV dysfunction but I suspect is related to her diabetes and/or idiopathic. Continue medical therapy will be recommended. Given the degree of LV dysfunction she may be a candidate for a "LifeVest " as a bridge to potential ICD therapy for primary prevention  Pt was originally scheduled for this procedure 10/04/15 but it was cancelled so pt could be evaluated by cardiology s/p NSTEMI following shoulder surgery last August. Stress test and echo performed as above. Pt is cleared for surgery at moderate risk by Dr. Allyson Sabal in Epic note dated 11/07/15. Dr. Allyson Sabal notes "her Celine Ahr, despite being high risk, is not significantly different than the one performed in 2014 which led to heart cath that revealed only minor irregularities in her coronary circulation with diminished LV function consistent with nonischemic cardiomyopathy. Her EF currently is in the 35% range. I believe she should be cleared for her upcoming shoulder surgery at moderate risk due to LV dysfunction but should be allowed to have this done".   If labs acceptable DOS, I anticipate pt can proceed as scheduled.   Rica Mast, FNP-BC [facility] Short Stay Surgical Center/Anesthesiology Phone: [phone] 11/21/2015 12:14 PM

## 2015-11-21 NOTE — Progress Notes (Signed)
Pt states she had a heart attack after her shoulder surgery in August, 2016. Dr. Ival Bible notes state it was a NSTEMI. Cardiac clearance has been received from Dr. Allyson Sabal. Pt denies any recent chest pain or sob.  Cath 09/20/13 - in EPIC Echo 10/23/15 - in EPIC Stress 11/07/15 - in EPIC EKG 10/12/15 -in EPIC  Pt is diabetic. States fasting blood sugar is running between 75-120. Pt doesn't remember when her last A1C was. Pt instructed to take 16 units (80%) of her Lantus Insulin Thursday PM. Instructed her to check her blood sugar in the AM and if greater than 220, instructed her to use 1/2 of correction dose of Novolog insulin. Also instructed her if blood sugar is 70 or less that she needs to treat it with 1/2 cup (4 oz) of clear (apple or cranberry) juice and then recheck her blood sugar 15 minutes after drinking juice. If it is still 70 or below, instructed pt to call Short Stay and ask to speak to a nurse, phone # given to pt. These instructions were given per Diabetes Medication Adjustment Guidelines Prior to Procedure and Surgery. Pt voiced understanding.

## 2015-11-22 MED ORDER — DEXTROSE 5 % IV SOLN
1.5000 g | INTRAVENOUS | Status: AC
  Administered 2015-11-23: 1.5 g via INTRAVENOUS
  Filled 2015-11-22 (×2): qty 1.5

## 2015-11-23 ENCOUNTER — Ambulatory Visit (HOSPITAL_COMMUNITY): Payer: Medicaid Other | Admitting: Emergency Medicine

## 2015-11-23 ENCOUNTER — Other Ambulatory Visit: Payer: Self-pay | Admitting: *Deleted

## 2015-11-23 ENCOUNTER — Encounter (HOSPITAL_COMMUNITY)
Admission: RE | Disposition: A | Discharge status: Home / Self Care | Payer: Self-pay | Source: Ambulatory Visit | Attending: Vascular Surgery

## 2015-11-23 ENCOUNTER — Ambulatory Visit (HOSPITAL_COMMUNITY)
Admission: RE | Admit: 2015-11-23 | Discharge: 2015-11-23 | Disposition: A | Discharge status: Home / Self Care | Payer: Medicaid Other | Source: Ambulatory Visit | Attending: Vascular Surgery | Admitting: Vascular Surgery

## 2015-11-23 ENCOUNTER — Encounter (HOSPITAL_COMMUNITY): Payer: Self-pay | Admitting: *Deleted

## 2015-11-23 DIAGNOSIS — Z794 Long term (current) use of insulin: Secondary | ICD-10-CM | POA: Insufficient documentation

## 2015-11-23 DIAGNOSIS — I252 Old myocardial infarction: Secondary | ICD-10-CM | POA: Insufficient documentation

## 2015-11-23 DIAGNOSIS — J449 Chronic obstructive pulmonary disease, unspecified: Secondary | ICD-10-CM | POA: Insufficient documentation

## 2015-11-23 DIAGNOSIS — I2510 Atherosclerotic heart disease of native coronary artery without angina pectoris: Secondary | ICD-10-CM | POA: Insufficient documentation

## 2015-11-23 DIAGNOSIS — Z4931 Encounter for adequacy testing for hemodialysis: Secondary | ICD-10-CM

## 2015-11-23 DIAGNOSIS — I5042 Chronic combined systolic (congestive) and diastolic (congestive) heart failure: Secondary | ICD-10-CM | POA: Insufficient documentation

## 2015-11-23 DIAGNOSIS — E119 Type 2 diabetes mellitus without complications: Secondary | ICD-10-CM | POA: Diagnosis not present

## 2015-11-23 DIAGNOSIS — N186 End stage renal disease: Secondary | ICD-10-CM

## 2015-11-23 DIAGNOSIS — E785 Hyperlipidemia, unspecified: Secondary | ICD-10-CM | POA: Diagnosis not present

## 2015-11-23 DIAGNOSIS — I129 Hypertensive chronic kidney disease with stage 1 through stage 4 chronic kidney disease, or unspecified chronic kidney disease: Secondary | ICD-10-CM | POA: Diagnosis present

## 2015-11-23 DIAGNOSIS — N184 Chronic kidney disease, stage 4 (severe): Secondary | ICD-10-CM | POA: Diagnosis not present

## 2015-11-23 DIAGNOSIS — I429 Cardiomyopathy, unspecified: Secondary | ICD-10-CM | POA: Diagnosis not present

## 2015-11-23 DIAGNOSIS — Z6841 Body Mass Index (BMI) 40.0 and over, adult: Secondary | ICD-10-CM | POA: Insufficient documentation

## 2015-11-23 HISTORY — DX: Acute myocardial infarction, unspecified: I21.9

## 2015-11-23 HISTORY — PX: AV FISTULA PLACEMENT: SHX1204

## 2015-11-23 HISTORY — DX: Atherosclerotic heart disease of native coronary artery without angina pectoris: I25.10

## 2015-11-23 LAB — POCT I-STAT 4, (NA,K, GLUC, HGB,HCT)
Glucose, Bld: 91 mg/dL (ref 65–99)
HCT: 27 % — ABNORMAL LOW (ref 36.0–46.0)
HEMOGLOBIN: 9.2 g/dL — AB (ref 12.0–15.0)
Potassium: 4.1 mmol/L (ref 3.5–5.1)
SODIUM: 136 mmol/L (ref 135–145)

## 2015-11-23 LAB — HCG, SERUM, QUALITATIVE: Preg, Serum: NEGATIVE

## 2015-11-23 LAB — GLUCOSE, CAPILLARY
GLUCOSE-CAPILLARY: 98 mg/dL (ref 65–99)
GLUCOSE-CAPILLARY: 80 mg/dL (ref 65–99)
Glucose-Capillary: 91 mg/dL (ref 65–99)

## 2015-11-23 SURGERY — ARTERIOVENOUS (AV) FISTULA CREATION
Anesthesia: Monitor Anesthesia Care | Site: Arm Upper | Laterality: Left

## 2015-11-23 MED ORDER — ONDANSETRON HCL 4 MG/2ML IJ SOLN
INTRAMUSCULAR | Status: AC
  Filled 2015-11-23: qty 2

## 2015-11-23 MED ORDER — SODIUM CHLORIDE 0.9 % IV SOLN
INTRAVENOUS | Status: DC | PRN
  Administered 2015-11-23: 500 mL

## 2015-11-23 MED ORDER — LIDOCAINE HCL (PF) 1 % IJ SOLN
INTRAMUSCULAR | Status: DC | PRN
  Administered 2015-11-23: 30 mL

## 2015-11-23 MED ORDER — 0.9 % SODIUM CHLORIDE (POUR BTL) OPTIME
TOPICAL | Status: DC | PRN
  Administered 2015-11-23: 1000 mL

## 2015-11-23 MED ORDER — LIDOCAINE HCL (CARDIAC) 20 MG/ML IV SOLN
INTRAVENOUS | Status: AC
  Filled 2015-11-23: qty 5

## 2015-11-23 MED ORDER — SODIUM CHLORIDE 0.9 % IV SOLN
INTRAVENOUS | Status: DC
  Administered 2015-11-23 (×2): via INTRAVENOUS

## 2015-11-23 MED ORDER — LIDOCAINE HCL (PF) 1 % IJ SOLN
INTRAMUSCULAR | Status: AC
  Filled 2015-11-23: qty 30

## 2015-11-23 MED ORDER — EPHEDRINE SULFATE 50 MG/ML IJ SOLN
INTRAMUSCULAR | Status: DC | PRN
  Administered 2015-11-23: 15 mg via INTRAVENOUS

## 2015-11-23 MED ORDER — GLYCOPYRROLATE 0.2 MG/ML IJ SOLN
INTRAMUSCULAR | Status: DC | PRN
  Administered 2015-11-23: 0.2 mg via INTRAVENOUS

## 2015-11-23 MED ORDER — CHLORHEXIDINE GLUCONATE CLOTH 2 % EX PADS: 6.0000 | MEDICATED_PAD | Freq: Once | CUTANEOUS | Status: DC

## 2015-11-23 MED ORDER — PROTAMINE SULFATE 10 MG/ML IV SOLN
INTRAVENOUS | Status: DC | PRN
  Administered 2015-11-23: 50 mg via INTRAVENOUS

## 2015-11-23 MED ORDER — FENTANYL CITRATE (PF) 100 MCG/2ML IJ SOLN
INTRAMUSCULAR | Status: AC
  Filled 2015-11-23: qty 2

## 2015-11-23 MED ORDER — MIDAZOLAM HCL 2 MG/2ML IJ SOLN
INTRAMUSCULAR | Status: AC
  Filled 2015-11-23: qty 2

## 2015-11-23 MED ORDER — PROPOFOL 10 MG/ML IV BOLUS
INTRAVENOUS | Status: DC | PRN
  Administered 2015-11-23 (×2): 20 mg via INTRAVENOUS

## 2015-11-23 MED ORDER — ALBUMIN HUMAN 5 % IV SOLN
INTRAVENOUS | Status: DC | PRN
  Administered 2015-11-23 (×2): via INTRAVENOUS

## 2015-11-23 MED ORDER — LIDOCAINE HCL (CARDIAC) 20 MG/ML IV SOLN
INTRAVENOUS | Status: DC | PRN
  Administered 2015-11-23: 40 mg via INTRATRACHEAL
  Administered 2015-11-23: 60 mg via INTRATRACHEAL

## 2015-11-23 MED ORDER — MIDAZOLAM HCL 5 MG/5ML IJ SOLN
INTRAMUSCULAR | Status: DC | PRN
  Administered 2015-11-23: 2 mg via INTRAVENOUS

## 2015-11-23 MED ORDER — HEPARIN SODIUM (PORCINE) 1000 UNIT/ML IJ SOLN
INTRAMUSCULAR | Status: DC | PRN
  Administered 2015-11-23: 9000 [IU] via INTRAVENOUS

## 2015-11-23 MED ORDER — ONDANSETRON HCL 4 MG/2ML IJ SOLN
INTRAMUSCULAR | Status: DC | PRN
  Administered 2015-11-23: 4 mg via INTRAVENOUS

## 2015-11-23 MED ORDER — ONDANSETRON HCL 4 MG/2ML IJ SOLN
4.0000 mg | Freq: Once | INTRAMUSCULAR | Status: AC | PRN
  Administered 2015-11-23: 4 mg via INTRAVENOUS

## 2015-11-23 MED ORDER — OXYCODONE-ACETAMINOPHEN 5-325 MG PO TABS: 1.0000 | ORAL_TABLET | Freq: Four times a day (QID) | ORAL | Status: DC | PRN

## 2015-11-23 MED ORDER — PHENYLEPHRINE HCL 10 MG/ML IJ SOLN
INTRAMUSCULAR | Status: DC | PRN
  Administered 2015-11-23: 80 ug via INTRAVENOUS
  Administered 2015-11-23: 40 ug via INTRAVENOUS
  Administered 2015-11-23: 80 ug via INTRAVENOUS
  Administered 2015-11-23: 40 ug via INTRAVENOUS
  Administered 2015-11-23: 80 ug via INTRAVENOUS

## 2015-11-23 MED ORDER — PROPOFOL 500 MG/50ML IV EMUL
INTRAVENOUS | Status: DC | PRN
  Administered 2015-11-23: 50 ug/kg/min via INTRAVENOUS

## 2015-11-23 MED ORDER — PROPOFOL 10 MG/ML IV BOLUS
INTRAVENOUS | Status: AC
  Filled 2015-11-23: qty 40

## 2015-11-23 MED ORDER — FENTANYL CITRATE (PF) 100 MCG/2ML IJ SOLN
INTRAMUSCULAR | Status: DC | PRN
  Administered 2015-11-23 (×3): 50 ug via INTRAVENOUS

## 2015-11-23 MED ORDER — FENTANYL CITRATE (PF) 100 MCG/2ML IJ SOLN
25.0000 ug | INTRAMUSCULAR | Status: DC | PRN
  Administered 2015-11-23: 25 ug via INTRAVENOUS
  Administered 2015-11-23: 50 ug via INTRAVENOUS

## 2015-11-23 MED ORDER — FENTANYL CITRATE (PF) 250 MCG/5ML IJ SOLN
INTRAMUSCULAR | Status: AC
  Filled 2015-11-23: qty 5

## 2015-11-23 SURGICAL SUPPLY — 42 items
ARMBAND PINK RESTRICT EXTREMIT (MISCELLANEOUS) ×3 IMPLANT
CANISTER SUCTION 2500CC (MISCELLANEOUS) ×3 IMPLANT
CANNULA VESSEL 3MM 2 BLNT TIP (CANNULA) ×3 IMPLANT
CLIP TI MEDIUM 24 (CLIP) ×3 IMPLANT
CLIP TI MEDIUM 6 (CLIP) ×3 IMPLANT
CLIP TI WIDE RED SMALL 24 (CLIP) ×3 IMPLANT
CLIP TI WIDE RED SMALL 6 (CLIP) ×3 IMPLANT
COVER PROBE W GEL 5X96 (DRAPES) IMPLANT
DECANTER SPIKE VIAL GLASS SM (MISCELLANEOUS) ×3 IMPLANT
DRAPE PROXIMA HALF (DRAPES) ×3 IMPLANT
ELECT REM PT RETURN 9FT ADLT (ELECTROSURGICAL) ×3
ELECTRODE REM PT RTRN 9FT ADLT (ELECTROSURGICAL) ×1 IMPLANT
GAUZE SPONGE 4X4 16PLY XRAY LF (GAUZE/BANDAGES/DRESSINGS) ×3 IMPLANT
GLOVE BIO SURGEON STRL SZ7.5 (GLOVE) ×3 IMPLANT
GLOVE BIOGEL PI IND STRL 6.5 (GLOVE) ×4 IMPLANT
GLOVE BIOGEL PI IND STRL 7.5 (GLOVE) ×2 IMPLANT
GLOVE BIOGEL PI IND STRL 8 (GLOVE) ×1 IMPLANT
GLOVE BIOGEL PI INDICATOR 6.5 (GLOVE) ×8
GLOVE BIOGEL PI INDICATOR 7.5 (GLOVE) ×4
GLOVE BIOGEL PI INDICATOR 8 (GLOVE) ×2
GLOVE ECLIPSE 7.0 STRL STRAW (GLOVE) ×3 IMPLANT
GLOVE SS BIOGEL STRL SZ 6.5 (GLOVE) ×1 IMPLANT
GLOVE SUPERSENSE BIOGEL SZ 6.5 (GLOVE) ×2
GLOVE SURG SS PI 6.5 STRL IVOR (GLOVE) ×3 IMPLANT
GLOVE SURG SS PI 7.5 STRL IVOR (GLOVE) ×3 IMPLANT
GOWN STRL REUS W/ TWL LRG LVL3 (GOWN DISPOSABLE) ×6 IMPLANT
GOWN STRL REUS W/TWL LRG LVL3 (GOWN DISPOSABLE) ×12
KIT BASIN OR (CUSTOM PROCEDURE TRAY) ×3 IMPLANT
KIT ROOM TURNOVER OR (KITS) ×3 IMPLANT
LIQUID BAND (GAUZE/BANDAGES/DRESSINGS) ×3 IMPLANT
NS IRRIG 1000ML POUR BTL (IV SOLUTION) ×3 IMPLANT
PACK CV ACCESS (CUSTOM PROCEDURE TRAY) ×3 IMPLANT
PAD ARMBOARD 7.5X6 YLW CONV (MISCELLANEOUS) ×6 IMPLANT
SLEEVE SURGEON STRL (DRAPES) ×3 IMPLANT
SPONGE SURGIFOAM ABS GEL 100 (HEMOSTASIS) IMPLANT
SUT PROLENE 6 0 BV (SUTURE) ×3 IMPLANT
SUT SILK 2 0 SH (SUTURE) ×3 IMPLANT
SUT VIC AB 3-0 SH 27 (SUTURE) ×4
SUT VIC AB 3-0 SH 27X BRD (SUTURE) ×2 IMPLANT
SUT VICRYL 4-0 PS2 18IN ABS (SUTURE) ×6 IMPLANT
UNDERPAD 30X30 INCONTINENT (UNDERPADS AND DIAPERS) ×3 IMPLANT
WATER STERILE IRR 1000ML POUR (IV SOLUTION) ×3 IMPLANT

## 2015-11-23 NOTE — Anesthesia Preprocedure Evaluation (Addendum)
Anesthesia Evaluation  Patient identified by MRN, date of birth, ID band Patient awake    Reviewed: Allergy & Precautions, NPO status , Patient's Chart, lab work & pertinent test results, reviewed documented beta blocker date and time   History of Anesthesia Complications Negative for: history of anesthetic complications  Airway Mallampati: II  TM Distance: >3 FB Neck ROM: Full    Dental  (+) Teeth Intact, Dental Advisory Given   Pulmonary shortness of breath and with exertion, COPD,  COPD inhaler,    Pulmonary exam normal breath sounds clear to auscultation       Cardiovascular hypertension, Pt. on medications and Pt. on home beta blockers (-) angina+ Past MI and +CHF  Normal cardiovascular exam Rhythm:Regular Rate:Normal  Non-ischemic cardiomyopathy - by echo 8/16- EF 35-40%   Neuro/Psych  Headaches, negative psych ROS   GI/Hepatic negative GI ROS, Neg liver ROS, neg GERD  ,  Endo/Other  diabetes, Type 2, Insulin DependentMorbid obesity  Renal/GU ESRFRenal diseaseK+ 4.1     Musculoskeletal  (+) Arthritis , Osteoarthritis,    Abdominal (+) + obese,   Peds  Hematology  (+) Blood dyscrasia, anemia ,   Anesthesia Other Findings Day of surgery medications reviewed with the patient.  Reproductive/Obstetrics                         Anesthesia Physical Anesthesia Plan  ASA: III  Anesthesia Plan: MAC   Post-op Pain Management:    Induction: Intravenous  Airway Management Planned: Nasal Cannula  Additional Equipment:   Intra-op Plan:   Post-operative Plan:   Informed Consent: I have reviewed the patients History and Physical, chart, labs and discussed the procedure including the risks, benefits and alternatives for the proposed anesthesia with the patient or authorized representative who has indicated his/her understanding and acceptance.   Dental advisory given  Plan Discussed with:  CRNA and Anesthesiologist  Anesthesia Plan Comments: (Discussed risks/benefits/alternatives to MAC sedation including need for ventilatory support, hypotension, need for conversion to general anesthesia.  All patient questions answered.  Patient wished to proceed.)       Anesthesia Quick Evaluation                                  Anesthesia Evaluation  Patient identified by MRN, date of birth, ID band Patient awake    Reviewed: Allergy & Precautions, NPO status , Patient's Chart, lab work & pertinent test results, reviewed documented beta blocker date and time   History of Anesthesia Complications Negative for: history of anesthetic complications  Airway Mallampati: II  TM Distance: >3 FB Neck ROM: Full    Dental  (+) Dental Advisory Given   Pulmonary shortness of breath, asthma , COPD COPD inhaler,  breath sounds clear to auscultation        Cardiovascular hypertension, Pt. on medications and Pt. on home beta blockers - anginaRhythm:Regular Rate:Normal  05/07/15 ECHO: Systolic function moderately reduced. The estimated ejection fraction 35% to 40%. 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)     Musculoskeletal  (+) Arthritis -,   Abdominal (+) + obese,   Peds  Hematology  (+) Blood dyscrasia (Hb 9.7), ,   Anesthesia Other Findings   Reproductive/Obstetrics 05/07/15 preg test: NEG  Anesthesia Physical Anesthesia Plan  ASA: III  Anesthesia Plan: General   Post-op Pain Management: GA combined w/ Regional for post-op pain   Induction: Intravenous  Airway Management Planned: Oral ETT  Additional Equipment: CVP  Intra-op Plan:   Post-operative Plan: Extubation in OR  Informed Consent: I have reviewed the patients History and Physical, chart, labs and  discussed the procedure including the risks, benefits and alternatives for the proposed anesthesia with the patient or authorized representative who has indicated his/her understanding and acceptance.   Dental advisory given  Plan Discussed with: CRNA and Surgeon  Anesthesia Plan Comments: (Plan routine monitors, central line for access, GETA with interscalene block for post op analgesia)        Anesthesia Quick Evaluation

## 2015-11-23 NOTE — H&P (Signed)
[facility] of [facility]  Patient nameCarnita [name] [facility] [name]: [id number] DOB: 1982/08/18Sex: female  REASON FOR VISIT: For HD access.   HPI: [name] is a 35 y.o. female who had a left radiocephalic AV fistula placed on 89/16/9450. She had a small 2.5 mm radial artery and a 3.5 mm cephalic vein. This has occluded and she presents for evaluation for new access. She is right-handed. She is not yet on dialysis.he denies any recent uremic symptoms. Specifically she denies nausea, vomiting, fatigue, anorexia, or palpitations.   Past Medical History  Diagnosis Date  . Hypertension   . Asthma   . Hyperlipidemia   . Pneumonia     "couple times; have it now" (09/15/2013)  . Chronic bronchitis (HCC)     "just about q yr" (09/15/2013)  . Shortness of breath     "just recently; related to the pneumonia" (09/15/2013)  . Anemia   . Migraine     "get them alot" (09/15/2013)  . Arthritis     "left hand" (09/15/2013)  . Chronic kidney disease     "low kidney function" (09/15/2013)  . COPD (chronic obstructive pulmonary disease) (HCC)   . Normal coronary arteries     by cardiac catheterization 09/20/13  . Type 1 diabetes mellitus (HCC)     Family History  Problem Relation Age of Onset  . Diabetes Mother   . Stroke Mother   . Diabetes Maternal Grandmother   . Cancer Paternal Grandmother   . Hypertension Father   . Hyperlipidemia Father   . Cancer - Lung Father     SOCIAL HISTORY: Social History  Substance Use Topics  . Smoking status: Never Smoker   . Smokeless tobacco: Never Used  . Alcohol Use: No    Allergies  Allergen Reactions  . Aspirin Anaphylaxis  . Sulfur Hives  . Tramadol Other (See Comments)    Pt states she feels weird     Current Outpatient Prescriptions  Medication Sig Dispense Refill  .  albuterol (PROVENTIL HFA;VENTOLIN HFA) 108 (90 BASE) MCG/ACT inhaler Inhale 2 puffs into the lungs every 6 (six) hours as needed for wheezing or shortness of breath.     Marland Kitchen albuterol (PROVENTIL) (2.5 MG/3ML) 0.083% nebulizer solution Take 2.5 mg by nebulization every 6 (six) hours as needed for wheezing or shortness of breath.    Marland Kitchen atorvastatin (LIPITOR) 40 MG tablet Take 1 tablet (40 mg total) by mouth daily. (Patient taking differently: Take 40 mg by mouth at bedtime. ) 30 tablet 3  . budesonide (PULMICORT) 0.25 MG/2ML nebulizer solution Take 2 mLs (0.25 mg total) by nebulization 2 (two) times daily. (Patient taking differently: Take 0.25 mg by nebulization 2 (two) times daily as needed (wheezing/shortness of breath). ) 60 mL 12  . calcitRIOL (ROCALTROL) 0.25 MCG capsule Take 0.25 mcg by mouth daily.    . carvedilol (COREG) 25 MG tablet 1 tablet PO BID. (Patient taking differently: Take 25 mg by mouth 2 (two) times daily with a meal. ) 60 tablet 0  . diphenhydrAMINE (BENADRYL) 25 MG tablet Take 50 mg by mouth at bedtime as needed for allergies.    . furosemide (LASIX) 80 MG tablet Take 160 mg by mouth 2 (two) times daily.     . hydrALAZINE (APRESOLINE) 25 MG tablet Take 1.5 tablets (37.5 mg total) by mouth every 8 (eight) hours. 120 tablet 0  . insulin aspart (NOVOLOG FLEXPEN) 100 UNIT/ML FlexPen Inject 12-20 Units into the skin 3 (three) times daily with  meals. Per sliding scale - based on carb count and CBG    . Insulin Glargine (LANTUS SOLOSTAR) 100 UNIT/ML Solostar Pen Inject 20 Units into the skin at bedtime.    . isosorbide dinitrate (ISORDIL) 20 MG tablet TAKE 1 TABLET BY MOUTH THREE TIMES DAILY (Patient taking differently: Take 20 mg by mouth 3 (three) times daily. ) 270 tablet 0  . oxyCODONE (ROXICODONE) 5 MG immediate release tablet Take 1 tablet (5 mg total) by mouth every 6 (six) hours as needed for severe pain. 20 tablet 0  .  oxyCODONE-acetaminophen (ROXICET) 5-325 MG per tablet Take 1-2 tablets by mouth every 4 (four) hours as needed for severe pain. 60 tablet 0   No current facility-administered medications for this visit.    REVIEW OF SYSTEMS:   denotes positive finding,  denotes negative finding Cardiac  Comments:  Chest pain or chest pressure:    Shortness of breath upon exertion:    Short of breath when lying flat:    Irregular heart rhythm:        Vascular    Pain in calf, thigh, or hip brought on by ambulation:    Pain in feet at night that wakes you up from your sleep:     Blood clot in your veins:    Leg swelling:         Pulmonary    Oxygen at home:    Productive cough:     Wheezing:         Neurologic    Sudden weakness in arms or legs:     Sudden numbness in arms or legs:     Sudden onset of difficulty speaking or slurred speech:    Temporary loss of vision in one eye:     Problems with dizziness:         Gastrointestinal    Blood in stool:     Vomited blood:         Genitourinary    Burning when urinating:     Blood in urine:        Psychiatric    Major depression:         Hematologic    Bleeding problems:    Problems with blood clotting too easily:        Skin    Rashes or ulcers:        Constitutional    Fever or chills:      PHYSICAL EXAM: Filed Vitals:   07/25/15 1019 07/25/15 1021  BP: 151/79 148/85  Pulse: 95   Height:  (1.727 m)   Weight: 332 lb 9.6 oz (150.866 kg)   SpO2: 95%     GENERAL: The patient is a well-nourished female, in no acute distress. The vital signs are documented above. CARDIAC: There is a regular rate and rhythm.  VASCULAR: she has a palpable radial pulse on the right. He has a diminished radial pulse on the left. Her left radiocephalic fistula does not have a bruit  or thrill. PULMONARY: There is good air exchange bilaterally without wheezing or rales. ABDOMEN: Soft and non-tender with normal pitched bowel sounds.  MUSCULOSKELETAL: There are no major deformities or cyanosis. NEUROLOGIC: No focal weakness or paresthesias are detected. SKIN: There are no ulcers or rashes noted. PSYCHIATRIC: The patient has a normal affect.  DATA:  I have reviewed her previous vein map studies. Her only remaining option on the left for a fistula would be a basilic vein transposition. This vein  is marginal. The veins in the right arm do not look any better.  MEDICAL ISSUES:  STAGE IV CHRONIC KIDNEY DISEASE:   STAGE IV CHRONIC KIDNEY DISEASE: I have recommended that we explore her basilic vein on the left and if this is adequate perform a basilic vein transposition. If this is not adequately place an AV graft. I do not think the veins in the right arm look any better. She does not want to schedule her surgery today as she needs to speak to her husband about the schedule. In addition she scheduled to see Dr. Eliott Nine on Friday. Knowning that she may require an AV graft she may want [place] to hold off for now. However the last GFR IC is 16 so if the basilic vein is not adequate I would likely place an AV graft.  HYPERTENSION: The patient's initial blood pressure today was elevated. We repeated this and this was still elevated. We have encouraged the patient to follow up with their primary care physician for management of their blood pressure.   Waverly Ferrari [facility] [place] [facility]: [phone]

## 2015-11-23 NOTE — OR Nursing (Addendum)
Pt and pts significant other were given d/c instructions about wound care and what to expect pain wise. They were told when to call the doctor and given a number to reach incase they had any questions after being discharged. Both pt and significant other were asked if they had any further questions and they denied any questions. Pts O2sat are now 100% on RA. Prescriptions were given to pts significant other. Staff assisted pt out of facility by wheelchair.

## 2015-11-23 NOTE — Transfer of Care (Signed)
Immediate Anesthesia Transfer of Care Note  Patient: [name]  Procedure(s) Performed: Procedure(s):  LEFT ARM BASILIC VEIN TRANSPOSITION   (Left)  Patient Location: PACU  Anesthesia Type:General  Level of Consciousness: awake, alert , oriented and sedated  Airway & Oxygen Therapy: Patient Spontanous Breathing and Patient connected to face mask oxygen  Post-op Assessment: Report given to RN, Post -op Vital signs reviewed and stable and Patient moving all extremities  Post vital signs: Reviewed and stable  Last Vitals:  Filed Vitals:   11/23/15 0800  BP: 171/95  Pulse: 83  Temp: 36.7 C  Resp: 18    Complications: No apparent anesthesia complications

## 2015-11-23 NOTE — Op Note (Signed)
    NAME: [name]    MRN: [id number] DOB: [birth year]    DATE OF OPERATION: 11/23/2015  PREOP DIAGNOSIS: Stage IV chronic kidney disease  POSTOP DIAGNOSIS: Same  PROCEDURE: Left basilic vein transposition  SURGEON: Di Kindle. Edilia Bo, MD, FACS  ASSIST: Cyndy Freeze PA  ANESTHESIA: local with sedation converted to general   EBL: minimal  INDICATIONS: [name] is a 35 y.o. female presents for new access. She is not on dialysis yet.  FINDINGS: 4 mm upper arm basilic vein. Small brachial artery  TECHNIQUE: The patient was taken to the operative room and sedated by anesthesia. The left upper extremity was prepped and draped in usual sterile fashion. After the skin was anesthetized with LIDOCAINE and a longitudinal incision was made over the basilic vein just above the antecubital level. The vein here was dissected free. This was done under local anesthetic. Using 2 additional incisions along the medial aspect of the left upper arm the basilic vein was harvested up to the level of the axilla. Branches were divided between clips and 3-0 silk ties. The patient had problems with saturation and ultimately was converted to a general anesthetic. A separate longitudinal incision was made over the brachial artery and here the artery was dissected free. It was somewhat small. The vein was ligated distally and then removed from its tunnel. It was distended up with heparinized saline. It was marked to prevent twisting. A curved tunnel was used to tunnel between the incision of the brachial artery to the axillary incision and the vein was brought to the tunnel. The patient was heparinized. The brachial artery was clamped proximally and distally and a longitudinal arteriotomy was made. The vein was spatulated and sewn end-to-side to the artery using continuous 6-0 Prolene suture. At the completion was a good thrill in the fistula and a good radial and ulnar signal with the Doppler. The heparin was  partially reversed with protamine. Each incisions was closed with 2 deep layers of 3-0 Vicryl and the skin closed with 4-0 Vicryl. Liquid bandage was applied. The patient tolerated the procedure well and transferred to the recovery room in stable condition. All needle and sponge counts were correct.  Waverly Ferrari, MD, FACS [facility]  DATE OF DICTATION:   11/23/2015

## 2015-11-23 NOTE — Anesthesia Postprocedure Evaluation (Signed)
Anesthesia Post Note  Patient: [name]  Procedure(s) Performed: Procedure(s) (LRB):  LEFT ARM BASILIC VEIN TRANSPOSITION   (Left)  Patient location during evaluation: PACU Anesthesia Type: General Level of consciousness: awake and alert Pain management: pain level controlled Vital Signs Assessment: post-procedure vital signs reviewed and stable Respiratory status: spontaneous breathing, nonlabored ventilation, respiratory function stable and patient connected to nasal cannula oxygen Cardiovascular status: blood pressure returned to baseline and stable Postop Assessment: no signs of nausea or vomiting Anesthetic complications: no    Last Vitals:  Filed Vitals:   11/23/15 0800 11/23/15 1340  BP: 171/95   Pulse: 83   Temp: 36.7 C 37.1 C  Resp: 18     Last Pain:  Filed Vitals:   11/23/15 1350  PainSc: 0-No pain                 Cecile Hearing

## 2015-11-26 ENCOUNTER — Telehealth: Payer: Self-pay | Admitting: Vascular Surgery

## 2015-11-26 ENCOUNTER — Encounter (HOSPITAL_COMMUNITY): Payer: Self-pay | Admitting: Vascular Surgery

## 2015-11-26 NOTE — Telephone Encounter (Signed)
-----   Message from Sharee Pimple, RN sent at 11/23/2015  1:34 PM EST ----- Regarding: schedule   ----- Message -----    From: Chuck Hint, MD    Sent: 11/23/2015   1:24 PM      To: Vvs Charge Pool Subject: charge                                         PROCEDURE: Left basilic vein transposition  SURGEON: Di Kindle. Edilia Bo, MD, FACS  ASSIST: Cyndy Freeze PA  She will need a follow up visit in 6 weeks with a duplex to check on the maturation of her fistula. Thank you. CD

## 2015-11-26 NOTE — Telephone Encounter (Signed)
Spoke with pt to schedule, dpm °

## 2015-11-30 ENCOUNTER — Inpatient Hospital Stay (HOSPITAL_COMMUNITY): Admission: RE | Admit: 2015-11-30 | Payer: Medicaid Other | Source: Ambulatory Visit

## 2016-01-01 ENCOUNTER — Encounter: Payer: Self-pay | Admitting: Vascular Surgery

## 2016-01-02 ENCOUNTER — Ambulatory Visit (HOSPITAL_COMMUNITY)
Admission: RE | Admit: 2016-01-02 | Discharge: 2016-01-02 | Disposition: A | Discharge status: Home / Self Care | Payer: Medicaid Other | Source: Ambulatory Visit | Attending: Vascular Surgery | Admitting: Vascular Surgery

## 2016-01-02 DIAGNOSIS — Z4931 Encounter for adequacy testing for hemodialysis: Secondary | ICD-10-CM | POA: Insufficient documentation

## 2016-01-02 DIAGNOSIS — N186 End stage renal disease: Secondary | ICD-10-CM | POA: Insufficient documentation

## 2016-01-09 ENCOUNTER — Encounter: Payer: Medicaid Other | Admitting: Vascular Surgery

## 2016-01-21 ENCOUNTER — Encounter: Payer: Self-pay | Admitting: *Deleted

## 2016-01-21 ENCOUNTER — Other Ambulatory Visit: Payer: Self-pay | Admitting: Physician Assistant

## 2016-01-21 NOTE — Progress Notes (Signed)
Please call our office to discuss your need for a refill.   ===View-only below this line===   ----- Message -----    From: [name]    Sent: 01/21/2016 11:23 AM EDT      To: Mosetta Pigeon, PA-C Subject: Medication Renewal Request  Original authorizing provider: Mosetta Pigeon, PA-C  [name] would like a refill of the following medications: oxyCODONE-acetaminophen (PERCOCET/ROXICET) 5-325 MG tablet [COLLINS, EMMA MAUREEN, PA-C]  Preferred pharmacy: [id number] - [place], [place] - [address] AT [address]  Comment:    Responded to patient's [facility] refill request. Pt had appt with Dr. Edilia Bo on 01-09-16 but No Showed.

## 2016-01-24 DIAGNOSIS — Z0279 Encounter for issue of other medical certificate: Secondary | ICD-10-CM

## 2016-02-01 ENCOUNTER — Encounter: Payer: Self-pay | Admitting: Vascular Surgery

## 2016-02-05 ENCOUNTER — Encounter (HOSPITAL_COMMUNITY)
Admission: RE | Admit: 2016-02-05 | Discharge: 2016-02-05 | Disposition: A | Discharge status: Home / Self Care | Payer: Medicaid Other | Source: Ambulatory Visit | Attending: Nephrology | Admitting: Nephrology

## 2016-02-05 DIAGNOSIS — Z5181 Encounter for therapeutic drug level monitoring: Secondary | ICD-10-CM | POA: Diagnosis not present

## 2016-02-05 DIAGNOSIS — D631 Anemia in chronic kidney disease: Secondary | ICD-10-CM | POA: Insufficient documentation

## 2016-02-05 DIAGNOSIS — N183 Chronic kidney disease, stage 3 (moderate): Secondary | ICD-10-CM | POA: Insufficient documentation

## 2016-02-05 DIAGNOSIS — Z79899 Other long term (current) drug therapy: Secondary | ICD-10-CM | POA: Insufficient documentation

## 2016-02-05 LAB — IRON AND TIBC
Iron: 52 ug/dL (ref 28–170)
SATURATION RATIOS: 17 % (ref 10.4–31.8)
TIBC: 305 ug/dL (ref 250–450)
UIBC: 253 ug/dL

## 2016-02-05 LAB — RENAL FUNCTION PANEL
ANION GAP: 24 — AB (ref 5–15)
Albumin: 3.4 g/dL — ABNORMAL LOW (ref 3.5–5.0)
BUN: 132 mg/dL — AB (ref 6–20)
CHLORIDE: 88 mmol/L — AB (ref 101–111)
CO2: 22 mmol/L (ref 22–32)
Calcium: 7.2 mg/dL — ABNORMAL LOW (ref 8.9–10.3)
Creatinine, Ser: 14.59 mg/dL — ABNORMAL HIGH (ref 0.44–1.00)
GFR calc Af Amer: 3 mL/min — ABNORMAL LOW (ref >60)
GFR calc non Af Amer: 3 mL/min — ABNORMAL LOW (ref >60)
GLUCOSE: 114 mg/dL — AB (ref 65–99)
POTASSIUM: 3.4 mmol/L — AB (ref 3.5–5.1)
Phosphorus: 11.5 mg/dL — ABNORMAL HIGH (ref 2.5–4.6)
Sodium: 134 mmol/L — ABNORMAL LOW (ref 135–145)

## 2016-02-05 LAB — FERRITIN: FERRITIN: 392 ng/mL — AB (ref 11–307)

## 2016-02-05 LAB — POCT HEMOGLOBIN-HEMACUE: HEMOGLOBIN: 10.5 g/dL — AB (ref 12.0–15.0)

## 2016-02-05 MED ORDER — EPOETIN ALFA 20000 UNIT/ML IJ SOLN
INTRAMUSCULAR | Status: AC
  Administered 2016-02-05: 20000 [IU] via SUBCUTANEOUS
  Filled 2016-02-05: qty 1

## 2016-02-05 MED ORDER — EPOETIN ALFA 20000 UNIT/ML IJ SOLN
20000.0000 [IU] | INTRAMUSCULAR | Status: DC
  Administered 2016-02-05: 20000 [IU] via SUBCUTANEOUS

## 2016-02-06 LAB — PTH, INTACT AND CALCIUM
CALCIUM TOTAL (PTH): 7 mg/dL — AB (ref 8.7–10.2)
PTH: 291 pg/mL — AB (ref 15–65)

## 2016-02-08 ENCOUNTER — Encounter: Payer: Self-pay | Admitting: Vascular Surgery

## 2016-02-08 ENCOUNTER — Ambulatory Visit (INDEPENDENT_AMBULATORY_CARE_PROVIDER_SITE_OTHER): Payer: Self-pay | Admitting: Vascular Surgery

## 2016-02-08 VITALS — BP 112/76 | HR 85 | Temp 98.0°F | Resp 16 | Ht 68.0 in | Wt 292.0 lb

## 2016-02-08 DIAGNOSIS — N184 Chronic kidney disease, stage 4 (severe): Secondary | ICD-10-CM

## 2016-02-08 NOTE — Progress Notes (Signed)
Patient name: [name] MRN: [id number] DOB: 26-Aug-1981 Sex: female  REASON FOR VISIT: Follow up after left basilic vein transposition.  HPI: [name] is a 35 y.o. female who is not yet on dialysis. She underwent a left basilic vein transposition on 11/23/2015. She returns for a routine follow up visit. She has no specific complaints. She denies pain or paresthesias in her left arm.  Current Outpatient Prescriptions  Medication Sig Dispense Refill  . albuterol (PROVENTIL HFA;VENTOLIN HFA) 108 (90 BASE) MCG/ACT inhaler Inhale 2 puffs into the lungs every 6 (six) hours as needed for wheezing or shortness of breath.     Marland Kitchen albuterol (PROVENTIL) (2.5 MG/3ML) 0.083% nebulizer solution Take 2.5 mg by nebulization every 6 (six) hours as needed for wheezing or shortness of breath.    Marland Kitchen atorvastatin (LIPITOR) 40 MG tablet Take 40 mg by mouth at bedtime.    . budesonide (PULMICORT) 0.25 MG/2ML nebulizer solution Take 0.25 mg by nebulization 2 (two) times daily as needed (shortness of breath or wheezing).    . calcitRIOL (ROCALTROL) 0.25 MCG capsule Take 0.25 mcg by mouth daily.    . carvedilol (COREG) 25 MG tablet Take 25 mg by mouth 2 (two) times daily.    . diphenhydrAMINE (BENADRYL) 25 MG tablet Take 50 mg by mouth at bedtime as needed for allergies.    . furosemide (LASIX) 80 MG tablet Take 240 mg by mouth 2 (two) times daily.     . hydrALAZINE (APRESOLINE) 25 MG tablet Take 1.5 tablets (37.5 mg total) by mouth every 8 (eight) hours. 120 tablet 0  . insulin aspart (NOVOLOG FLEXPEN) 100 UNIT/ML FlexPen Inject 12-20 Units into the skin 3 (three) times daily with meals. Per sliding scale - based on carb count and CBG    . Insulin Glargine (LANTUS SOLOSTAR) 100 UNIT/ML Solostar Pen Inject 20 Units into the skin at bedtime.    . isosorbide dinitrate (ISORDIL) 20 MG tablet Take 20 mg by mouth 3 (three) times daily.    Marland Kitchen oxyCODONE-acetaminophen (PERCOCET/ROXICET) 5-325 MG tablet Take 1 tablet by mouth  every 6 (six) hours as needed. (Patient not taking: Reported on 02/08/2016) 30 tablet 0   No current facility-administered medications for this visit.    REVIEW OF SYSTEMS:   denotes positive finding,  denotes negative finding Cardiac  Comments:  Chest pain or chest pressure:    Shortness of breath upon exertion:    Short of breath when lying flat:    Irregular heart rhythm:    Constitutional    Fever or chills:      PHYSICAL EXAM: Filed Vitals:   02/08/16 1338  BP: 112/76  Pulse: 85  Temp: 98 F (36.7 C)  Resp: 16  Height:  (1.727 m)  Weight: 292 lb (132.45 kg)  SpO2: 98%    GENERAL: The patient is a well-nourished female, in no acute distress. The vital signs are documented above. CARDIOVASCULAR: There is a regular rate and rhythm. PULMONARY: There is good air exchange bilaterally without wheezing or rales. There is an excellent thrill in her left basilic vein transposition. The left hand is warm and well-perfused.   DUPLEX OF LEFT BASILIC VEIN TRANSPOSITION: the diameters of the fistula range from 0.64-0.86 cm. Depths ranged from 0.55-0.63 cm. There are some elevated velocities in the proximal fistula at the site of the valve.  MEDICAL ISSUES:  STATUS POST LEFT BASILIC VEIN TRANSPOSITION: Her basilic vein transposition appears to be maturing adequately. I think this should  be ready for use if it was needed and a proximally 1 month. Certainly if there are any problems with the fistula we could obtain a fistulogram to evaluate the areas of increased velocity near valve site in the proximal fistula. However currently the fistula has an excellent thrill and I do not think this is necessary and therefore do not want to risk given her any contrast.  Waverly Ferrari [facility]: [phone]

## 2016-02-18 ENCOUNTER — Other Ambulatory Visit (HOSPITAL_COMMUNITY): Payer: Self-pay | Admitting: *Deleted

## 2016-02-19 ENCOUNTER — Encounter (HOSPITAL_COMMUNITY): Payer: PRIVATE HEALTH INSURANCE

## 2016-02-24 DIAGNOSIS — G894 Chronic pain syndrome: Secondary | ICD-10-CM | POA: Diagnosis present

## 2016-02-27 ENCOUNTER — Encounter (HOSPITAL_COMMUNITY)
Admission: RE | Admit: 2016-02-27 | Discharge: 2016-02-27 | Disposition: A | Discharge status: Home / Self Care | Payer: Medicaid Other | Source: Ambulatory Visit | Attending: Nephrology | Admitting: Nephrology

## 2016-02-27 DIAGNOSIS — N183 Chronic kidney disease, stage 3 (moderate): Secondary | ICD-10-CM | POA: Diagnosis not present

## 2016-02-27 LAB — POCT HEMOGLOBIN-HEMACUE: HEMOGLOBIN: 8.7 g/dL — AB (ref 12.0–15.0)

## 2016-02-27 MED ORDER — EPOETIN ALFA 20000 UNIT/ML IJ SOLN
INTRAMUSCULAR | Status: AC
  Filled 2016-02-27: qty 1

## 2016-02-27 MED ORDER — EPOETIN ALFA 20000 UNIT/ML IJ SOLN
20000.0000 [IU] | INTRAMUSCULAR | Status: DC
  Administered 2016-02-27: 20000 [IU] via SUBCUTANEOUS

## 2016-03-12 ENCOUNTER — Encounter (HOSPITAL_COMMUNITY): Payer: PRIVATE HEALTH INSURANCE

## 2016-03-30 ENCOUNTER — Emergency Department (HOSPITAL_COMMUNITY)
Admission: EM | Admit: 2016-03-30 | Discharge: 2016-03-30 | Disposition: A | Discharge status: Home / Self Care | Payer: Medicaid Other | Attending: Emergency Medicine | Admitting: Emergency Medicine

## 2016-03-30 ENCOUNTER — Encounter (HOSPITAL_COMMUNITY): Payer: Self-pay | Admitting: Emergency Medicine

## 2016-03-30 DIAGNOSIS — L42 Pityriasis rosea: Secondary | ICD-10-CM | POA: Diagnosis not present

## 2016-03-30 DIAGNOSIS — Z79899 Other long term (current) drug therapy: Secondary | ICD-10-CM | POA: Diagnosis not present

## 2016-03-30 DIAGNOSIS — I129 Hypertensive chronic kidney disease with stage 1 through stage 4 chronic kidney disease, or unspecified chronic kidney disease: Secondary | ICD-10-CM | POA: Insufficient documentation

## 2016-03-30 DIAGNOSIS — N189 Chronic kidney disease, unspecified: Secondary | ICD-10-CM | POA: Insufficient documentation

## 2016-03-30 DIAGNOSIS — E1022 Type 1 diabetes mellitus with diabetic chronic kidney disease: Secondary | ICD-10-CM | POA: Diagnosis not present

## 2016-03-30 DIAGNOSIS — J449 Chronic obstructive pulmonary disease, unspecified: Secondary | ICD-10-CM | POA: Insufficient documentation

## 2016-03-30 DIAGNOSIS — I2510 Atherosclerotic heart disease of native coronary artery without angina pectoris: Secondary | ICD-10-CM | POA: Insufficient documentation

## 2016-03-30 DIAGNOSIS — I252 Old myocardial infarction: Secondary | ICD-10-CM | POA: Diagnosis not present

## 2016-03-30 DIAGNOSIS — R21 Rash and other nonspecific skin eruption: Secondary | ICD-10-CM | POA: Diagnosis present

## 2016-03-30 MED ORDER — HYDROXYZINE HCL 25 MG PO TABS: 25.0000 mg | ORAL_TABLET | Freq: Four times a day (QID) | ORAL | Status: DC

## 2016-03-30 MED ORDER — HYDROCODONE-ACETAMINOPHEN 5-325 MG PO TABS: 2.0000 | ORAL_TABLET | ORAL | Status: DC | PRN

## 2016-03-30 MED ORDER — PREDNISONE 20 MG PO TABS
60.0000 mg | ORAL_TABLET | Freq: Once | ORAL | Status: AC
  Administered 2016-03-30: 60 mg via ORAL

## 2016-03-30 MED ORDER — PREDNISONE 20 MG PO TABS: 20.0000 mg | ORAL_TABLET | Freq: Two times a day (BID) | ORAL | Status: DC

## 2016-03-30 MED ORDER — PREDNISONE 20 MG PO TABS
60.0000 mg | ORAL_TABLET | Freq: Once | ORAL | Status: DC
  Filled 2016-03-30: qty 3

## 2016-03-30 MED ORDER — HYDROXYZINE HCL 25 MG PO TABS
25.0000 mg | ORAL_TABLET | Freq: Once | ORAL | Status: AC
  Administered 2016-03-30: 25 mg via ORAL
  Filled 2016-03-30: qty 1

## 2016-03-30 MED ORDER — METHYLPREDNISOLONE 4 MG PO TBPK: ORAL_TABLET | ORAL | Status: DC

## 2016-03-30 NOTE — ED Provider Notes (Signed)
CSN: [id number]     Arrival date & time 03/30/16  0100 History  By signing my name below, I, [name], attest that this documentation has been prepared under the direction and in the presence of Rolland Porter, MD. Electronically Signed: Bethel [name], ED Scribe. 03/30/2016. 3:40 AM   Chief Complaint  Patient presents with  . Insect Bite   The history is provided by the patient. No language interpreter was used.   [name] is a 35 y.o. female who presents to the Emergency Department complaining of a new pruritic rash at the arms, back, legs, and abdomen with onset 1 week ago. The rash started at the back and she believes that it is related to insect bites despite not being able to recall being bitten. She has been outside but denies any new exposures. She is not currently on an abx.  Pt denies SOB or difficulty breathing.  Past Medical History  Diagnosis Date  . Hypertension   . Asthma   . Hyperlipidemia   . Pneumonia     "couple times; have it now" (09/15/2013)  . Chronic bronchitis (HCC)     "just about q yr" (09/15/2013)  . Shortness of breath     "just recently; related to the pneumonia" (09/15/2013)  . Anemia   . Migraine     "get them alot" (09/15/2013)  . Arthritis     "left hand" (09/15/2013)  . Chronic kidney disease     "low kidney function" (09/15/2013)  . COPD (chronic obstructive pulmonary disease) (HCC)   . Normal coronary arteries     by cardiac catheterization 09/20/13  . Type 1 diabetes mellitus (HCC)     type 2  . Coronary artery disease   . Myocardial infarction [facility]) 04/2015    NSTEMI   Past Surgical History  Procedure Laterality Date  . Cesarean section  1999; 2006  . 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 Tonsillectomy  1997  . Tubal ligation  2006  . Coronary angiogram  09/20/2013    Procedure: CORONARY ANGIOGRAM;  Surgeon: Runell Gess, MD;  Location: [facility] CATH LAB;  Service:  Cardiovascular;;  . Av fistula placement Left 03/27/2015    Procedure: CREATION RADIAL CEPHALIC ARTERIOVENOUS FISTULA;  Surgeon: Chuck Hint, MD;  Location: [facility] OR;  Service: Vascular;  Laterality: Left;  . Shoulder arthroscopy with bicepstenotomy Right 05/10/2015    Procedure: RIGHT SHOULDER ARTHROSCOPY WITH BICEPS TENOTOMY, DEBRIDEMENT LABRAL TEAR;  Surgeon: Jones Broom, MD;  Location: [facility] OR;  Service: Orthopedics;  Laterality: Right;  Right shoulder arthroscopy biceps tenotomy, debridement labral tear  . Av fistula placement Left 11/23/2015    Procedure:  LEFT ARM BASILIC VEIN TRANSPOSITION  ;  Surgeon: Chuck Hint, MD;  Location: [facility] OR;  Service: Vascular;  Laterality: Left;   Family History  Problem Relation Age of Onset  . Diabetes Mother   . Stroke Mother   . Diabetes Maternal Grandmother   . Cancer Paternal Grandmother   . Hypertension Father   . Hyperlipidemia Father   . Cancer - Lung Father    Social History  Substance Use Topics  . Smoking status: Never Smoker   . Smokeless tobacco: Never Used  . Alcohol Use: No   OB History    No data available     Review of Systems  Constitutional: Negative for fever, chills, diaphoresis, appetite change and fatigue.  HENT: Negative for mouth sores,  sore throat and trouble swallowing.   Eyes: Negative for visual disturbance.  Respiratory: Negative for cough, chest tightness, shortness of breath and wheezing.   Cardiovascular: Negative for chest pain.  Gastrointestinal: Negative for nausea, vomiting, abdominal pain, diarrhea and abdominal distention.  Endocrine: Negative for polydipsia, polyphagia and polyuria.  Genitourinary: Negative for dysuria, frequency and hematuria.  Musculoskeletal: Negative for gait problem.  Skin: Positive for rash. Negative for color change and pallor.  Neurological: Negative for dizziness, syncope, light-headedness and headaches.  Hematological: Does not bruise/bleed easily.   Psychiatric/Behavioral: Negative for behavioral problems and confusion.      Allergies  Aspirin; Sulfur; and Tramadol  Home Medications   Prior to Admission medications   Medication Sig Start Date End Date Taking? Authorizing Provider  albuterol (PROVENTIL HFA;VENTOLIN HFA) 108 (90 BASE) MCG/ACT inhaler Inhale 2 puffs into the lungs every 6 (six) hours as needed for wheezing or shortness of breath.     Historical Provider, MD  albuterol (PROVENTIL) (2.5 MG/3ML) 0.083% nebulizer solution Take 2.5 mg by nebulization every 6 (six) hours as needed for wheezing or shortness of breath.    Historical Provider, MD  atorvastatin (LIPITOR) 40 MG tablet Take 40 mg by mouth at bedtime.    Historical Provider, MD  budesonide (PULMICORT) 0.25 MG/2ML nebulizer solution Take 0.25 mg by nebulization 2 (two) times daily as needed (shortness of breath or wheezing).    Historical Provider, MD  calcitRIOL (ROCALTROL) 0.25 MCG capsule Take 0.25 mcg by mouth daily.    Historical Provider, MD  carvedilol (COREG) 25 MG tablet Take 25 mg by mouth 2 (two) times daily.    Historical Provider, MD  diphenhydrAMINE (BENADRYL) 25 MG tablet Take 50 mg by mouth at bedtime as needed for allergies.    Historical Provider, MD  furosemide (LASIX) 80 MG tablet Take 240 mg by mouth 2 (two) times daily.  04/04/14   Quentin Angst, MD  hydrALAZINE (APRESOLINE) 25 MG tablet Take 1.5 tablets (37.5 mg total) by mouth every 8 (eight) hours. 04/03/14   Doris Cheadle, MD  insulin aspart (NOVOLOG FLEXPEN) 100 UNIT/ML FlexPen Inject 12-20 Units into the skin 3 (three) times daily with meals. Per sliding scale - based on carb count and CBG    Historical Provider, MD  Insulin Glargine (LANTUS SOLOSTAR) 100 UNIT/ML Solostar Pen Inject 20 Units into the skin at bedtime.    Historical Provider, MD  isosorbide dinitrate (ISORDIL) 20 MG tablet Take 20 mg by mouth 3 (three) times daily.    Historical Provider, MD  oxyCODONE-acetaminophen  (PERCOCET/ROXICET) 5-325 MG tablet Take 1 tablet by mouth every 6 (six) hours as needed. Patient not taking: Reported on 02/08/2016 11/23/15   Lars Mage, PA-C   BP 161/89 mmHg  Pulse 96  Temp(Src) 97.9 F (36.6 C) (Oral)  Resp 18  SpO2 98% Physical Exam  Constitutional: She is oriented to person, place, and time. She appears well-developed and well-nourished. No distress.  HENT:  Head: Normocephalic.  Eyes: Conjunctivae are normal. Pupils are equal, round, and reactive to light. No scleral icterus.  Neck: Normal range of motion. Neck supple. No thyromegaly present.  Cardiovascular: Normal rate and regular rhythm.  Exam reveals no gallop and no friction rub.   No murmur heard. Pulmonary/Chest: Effort normal and breath sounds normal. No respiratory distress. She has no wheezes. She has no rales.  Abdominal: Soft. Bowel sounds are normal. She exhibits no distension. There is no tenderness. There is no rebound.  Musculoskeletal: Normal range  of motion.  Neurological: She is alert and oriented to person, place, and time.  Skin: Skin is warm and dry. Rash noted.  Multiples papules and macules in a ferning pattern on the back Multiple macules and papules with excoriation noted at the upper and lower extremities   Psychiatric: She has a normal mood and affect. Her behavior is normal.    ED Course  Procedures (including critical care time) DIAGNOSTIC STUDIES: Oxygen Saturation is 98% on RA,  normal by my interpretation.    COORDINATION OF CARE: 3:38 AM Discussed treatment plan which includes steroids and an antihistamine  with pt at bedside and pt agreed to plan.  Labs Review Labs Reviewed - No data to display  Imaging Review No results found.  EKG Interpretation None      MDM   Final diagnoses:  Pityriasis rosea    I personally performed the services described in this documentation, which was scribed in my presence. The recorded information has been reviewed and is  accurate.    Rolland Porter, MD 03/30/16 [phone]

## 2016-03-30 NOTE — Discharge Instructions (Signed)
Pityriasis Rosea  Pityriasis rosea is a rash that usually appears on the trunk of the body. It may also appear on the upper arms and upper legs. It usually begins as a single patch, and then more patches begin to develop. The rash may cause mild itching, but it normally does not cause other problems. It usually goes away without treatment. However, it may take weeks or months for the rash to go away completely.  CAUSES  The cause of this condition is not known. The condition does not spread from person to person (is noncontagious).  RISK FACTORS  This condition is more likely to develop in young adults and children. It is most common in the spring and fall.  SYMPTOMS  The main symptom of this condition is a rash.  · The rash usually begins with a single oval patch that is larger than the ones that follow. This is called a herald patch. It generally appears a week or more before the rest of the rash appears.  · When more patches start to develop, they spread quickly on the trunk, back, and arms. These patches are smaller than the first one.  · The patches that make up the rash are usually oval-shaped and pink or red in color. They are usually flat, but they may sometimes be raised so that they can be felt with a finger. They may also be finely crinkled and have a scaly ring around the edge.  · The rash does not typically appear on areas of the skin that are exposed to the sun.  Most people who have this condition do not have other symptoms, but some have mild itching. In a few cases, a mild headache or body aches may occur before the rash appears and then go away.  DIAGNOSIS  Your health care provider may diagnose this condition by doing a physical exam and taking your medical history. To rule out other possible causes for the rash, the health care provider may order blood tests or take a skin sample from the rash to be looked at under a microscope.  TREATMENT  Usually, treatment is not needed for this condition. The  rash will probably go away on its own in 4-8 weeks. In some cases, a health care provider may recommend or prescribe medicine to reduce itching.  HOME CARE INSTRUCTIONS  · Take medicines only as directed by your health care provider.  · Avoid scratching the affected areas of skin.  · Do not take hot baths or use a sauna. Use only warm water when bathing or showering. Heat can increase itching.  SEEK MEDICAL CARE IF:  · Your rash does not go away in 8 weeks.  · Your rash gets much worse.  · You have a fever.  · You have swelling or pain in the rash area.  · You have fluid, blood, or pus coming from the rash area.     This information is not intended to replace advice given to you by your health care provider. Make sure you discuss any questions you have with your health care provider.     Document Released: 10/22/2001 Document Revised: 01/30/2015 Document Reviewed: 08/23/2014  Elsevier Interactive Patient Education ©2016 [employer]

## 2016-03-30 NOTE — ED Notes (Signed)
Pt. reports multiple insect bites / itchy rashes at arms /legs/back and abdomen for several weeks .

## 2016-04-22 ENCOUNTER — Other Ambulatory Visit: Payer: Self-pay | Admitting: Nephrology

## 2016-04-22 ENCOUNTER — Ambulatory Visit
Admission: RE | Admit: 2016-04-22 | Discharge: 2016-04-22 | Disposition: A | Discharge status: Home / Self Care | Payer: Medicaid Other | Source: Ambulatory Visit | Attending: Nephrology | Admitting: Nephrology

## 2016-04-22 DIAGNOSIS — R0602 Shortness of breath: Secondary | ICD-10-CM

## 2016-04-22 DIAGNOSIS — R05 Cough: Secondary | ICD-10-CM

## 2016-04-22 DIAGNOSIS — R059 Cough, unspecified: Secondary | ICD-10-CM

## 2016-04-25 ENCOUNTER — Other Ambulatory Visit (HOSPITAL_COMMUNITY): Payer: Self-pay | Admitting: Physician Assistant

## 2016-06-19 ENCOUNTER — Encounter (HOSPITAL_COMMUNITY): Payer: Self-pay | Admitting: *Deleted

## 2016-06-19 DIAGNOSIS — Z992 Dependence on renal dialysis: Secondary | ICD-10-CM

## 2016-06-19 DIAGNOSIS — M869 Osteomyelitis, unspecified: Secondary | ICD-10-CM | POA: Diagnosis present

## 2016-06-19 DIAGNOSIS — E1152 Type 2 diabetes mellitus with diabetic peripheral angiopathy with gangrene: Principal | ICD-10-CM | POA: Diagnosis present

## 2016-06-19 DIAGNOSIS — E1165 Type 2 diabetes mellitus with hyperglycemia: Secondary | ICD-10-CM | POA: Diagnosis present

## 2016-06-19 DIAGNOSIS — Z886 Allergy status to analgesic agent status: Secondary | ICD-10-CM

## 2016-06-19 DIAGNOSIS — Z888 Allergy status to other drugs, medicaments and biological substances status: Secondary | ICD-10-CM

## 2016-06-19 DIAGNOSIS — Z833 Family history of diabetes mellitus: Secondary | ICD-10-CM

## 2016-06-19 DIAGNOSIS — E785 Hyperlipidemia, unspecified: Secondary | ICD-10-CM | POA: Diagnosis present

## 2016-06-19 DIAGNOSIS — N2581 Secondary hyperparathyroidism of renal origin: Secondary | ICD-10-CM | POA: Diagnosis present

## 2016-06-19 DIAGNOSIS — E11628 Type 2 diabetes mellitus with other skin complications: Secondary | ICD-10-CM | POA: Diagnosis present

## 2016-06-19 DIAGNOSIS — E1122 Type 2 diabetes mellitus with diabetic chronic kidney disease: Secondary | ICD-10-CM | POA: Diagnosis present

## 2016-06-19 DIAGNOSIS — Z794 Long term (current) use of insulin: Secondary | ICD-10-CM

## 2016-06-19 DIAGNOSIS — I5042 Chronic combined systolic (congestive) and diastolic (congestive) heart failure: Secondary | ICD-10-CM | POA: Diagnosis present

## 2016-06-19 DIAGNOSIS — I132 Hypertensive heart and chronic kidney disease with heart failure and with stage 5 chronic kidney disease, or end stage renal disease: Secondary | ICD-10-CM | POA: Diagnosis present

## 2016-06-19 DIAGNOSIS — N186 End stage renal disease: Secondary | ICD-10-CM | POA: Diagnosis present

## 2016-06-19 DIAGNOSIS — J449 Chronic obstructive pulmonary disease, unspecified: Secondary | ICD-10-CM | POA: Diagnosis present

## 2016-06-19 DIAGNOSIS — I2510 Atherosclerotic heart disease of native coronary artery without angina pectoris: Secondary | ICD-10-CM | POA: Diagnosis present

## 2016-06-19 DIAGNOSIS — Z7951 Long term (current) use of inhaled steroids: Secondary | ICD-10-CM

## 2016-06-19 DIAGNOSIS — L03031 Cellulitis of right toe: Secondary | ICD-10-CM | POA: Diagnosis present

## 2016-06-19 DIAGNOSIS — I252 Old myocardial infarction: Secondary | ICD-10-CM

## 2016-06-19 DIAGNOSIS — Z79899 Other long term (current) drug therapy: Secondary | ICD-10-CM

## 2016-06-19 DIAGNOSIS — E11621 Type 2 diabetes mellitus with foot ulcer: Secondary | ICD-10-CM | POA: Diagnosis present

## 2016-06-19 DIAGNOSIS — Z7952 Long term (current) use of systemic steroids: Secondary | ICD-10-CM

## 2016-06-19 DIAGNOSIS — Z8249 Family history of ischemic heart disease and other diseases of the circulatory system: Secondary | ICD-10-CM

## 2016-06-19 DIAGNOSIS — Z6841 Body Mass Index (BMI) 40.0 and over, adult: Secondary | ICD-10-CM

## 2016-06-19 LAB — CBG MONITORING, ED: GLUCOSE-CAPILLARY: 185 mg/dL — AB (ref 65–99)

## 2016-06-19 MED ORDER — OXYCODONE-ACETAMINOPHEN 5-325 MG PO TABS
ORAL_TABLET | ORAL | Status: AC
  Filled 2016-06-19: qty 1

## 2016-06-19 MED ORDER — OXYCODONE-ACETAMINOPHEN 5-325 MG PO TABS
1.0000 | ORAL_TABLET | ORAL | Status: DC | PRN
  Administered 2016-06-19: 1 via ORAL

## 2016-06-19 NOTE — ED Triage Notes (Signed)
Pt was seen at [facility] a week ago for ulcer to R great toe, was referred to a podiatrist, who pt seen today Pt c/o redness, swelling, and ulcer to toe. Pt has been taking clindamycin and cephalexin without improvement

## 2016-06-20 ENCOUNTER — Inpatient Hospital Stay (HOSPITAL_COMMUNITY)
Admission: EM | Admit: 2016-06-20 | Discharge: 2016-06-26 | DRG: 255 | Disposition: A | Discharge status: Home / Self Care | Payer: Medicaid Other | Attending: Internal Medicine | Admitting: Internal Medicine

## 2016-06-20 ENCOUNTER — Emergency Department (HOSPITAL_COMMUNITY): Payer: Medicaid Other

## 2016-06-20 DIAGNOSIS — B9689 Other specified bacterial agents as the cause of diseases classified elsewhere: Secondary | ICD-10-CM | POA: Diagnosis not present

## 2016-06-20 DIAGNOSIS — E11621 Type 2 diabetes mellitus with foot ulcer: Secondary | ICD-10-CM | POA: Diagnosis present

## 2016-06-20 DIAGNOSIS — J449 Chronic obstructive pulmonary disease, unspecified: Secondary | ICD-10-CM | POA: Diagnosis present

## 2016-06-20 DIAGNOSIS — M869 Osteomyelitis, unspecified: Secondary | ICD-10-CM

## 2016-06-20 DIAGNOSIS — I5042 Chronic combined systolic (congestive) and diastolic (congestive) heart failure: Secondary | ICD-10-CM | POA: Diagnosis present

## 2016-06-20 DIAGNOSIS — E1022 Type 1 diabetes mellitus with diabetic chronic kidney disease: Secondary | ICD-10-CM | POA: Diagnosis not present

## 2016-06-20 DIAGNOSIS — I2510 Atherosclerotic heart disease of native coronary artery without angina pectoris: Secondary | ICD-10-CM | POA: Diagnosis present

## 2016-06-20 DIAGNOSIS — L03031 Cellulitis of right toe: Secondary | ICD-10-CM | POA: Diagnosis present

## 2016-06-20 DIAGNOSIS — I10 Essential (primary) hypertension: Secondary | ICD-10-CM | POA: Diagnosis present

## 2016-06-20 DIAGNOSIS — E11628 Type 2 diabetes mellitus with other skin complications: Secondary | ICD-10-CM | POA: Diagnosis present

## 2016-06-20 DIAGNOSIS — Z7952 Long term (current) use of systemic steroids: Secondary | ICD-10-CM | POA: Diagnosis not present

## 2016-06-20 DIAGNOSIS — E1169 Type 2 diabetes mellitus with other specified complication: Secondary | ICD-10-CM | POA: Diagnosis present

## 2016-06-20 DIAGNOSIS — Z992 Dependence on renal dialysis: Secondary | ICD-10-CM | POA: Diagnosis not present

## 2016-06-20 DIAGNOSIS — L98499 Non-pressure chronic ulcer of skin of other sites with unspecified severity: Secondary | ICD-10-CM | POA: Diagnosis not present

## 2016-06-20 DIAGNOSIS — E10621 Type 1 diabetes mellitus with foot ulcer: Secondary | ICD-10-CM | POA: Diagnosis not present

## 2016-06-20 DIAGNOSIS — L089 Local infection of the skin and subcutaneous tissue, unspecified: Secondary | ICD-10-CM | POA: Diagnosis present

## 2016-06-20 DIAGNOSIS — Z794 Long term (current) use of insulin: Secondary | ICD-10-CM | POA: Diagnosis not present

## 2016-06-20 DIAGNOSIS — Z833 Family history of diabetes mellitus: Secondary | ICD-10-CM | POA: Diagnosis not present

## 2016-06-20 DIAGNOSIS — E1069 Type 1 diabetes mellitus with other specified complication: Secondary | ICD-10-CM | POA: Diagnosis not present

## 2016-06-20 DIAGNOSIS — E1021 Type 1 diabetes mellitus with diabetic nephropathy: Secondary | ICD-10-CM

## 2016-06-20 DIAGNOSIS — I96 Gangrene, not elsewhere classified: Secondary | ICD-10-CM

## 2016-06-20 DIAGNOSIS — E1165 Type 2 diabetes mellitus with hyperglycemia: Secondary | ICD-10-CM | POA: Diagnosis present

## 2016-06-20 DIAGNOSIS — N186 End stage renal disease: Secondary | ICD-10-CM | POA: Diagnosis present

## 2016-06-20 DIAGNOSIS — I252 Old myocardial infarction: Secondary | ICD-10-CM | POA: Diagnosis not present

## 2016-06-20 DIAGNOSIS — I132 Hypertensive heart and chronic kidney disease with heart failure and with stage 5 chronic kidney disease, or end stage renal disease: Secondary | ICD-10-CM | POA: Diagnosis present

## 2016-06-20 DIAGNOSIS — E1129 Type 2 diabetes mellitus with other diabetic kidney complication: Secondary | ICD-10-CM | POA: Diagnosis present

## 2016-06-20 DIAGNOSIS — L0889 Other specified local infections of the skin and subcutaneous tissue: Secondary | ICD-10-CM | POA: Diagnosis not present

## 2016-06-20 DIAGNOSIS — B999 Unspecified infectious disease: Secondary | ICD-10-CM

## 2016-06-20 DIAGNOSIS — E785 Hyperlipidemia, unspecified: Secondary | ICD-10-CM | POA: Diagnosis present

## 2016-06-20 DIAGNOSIS — E1152 Type 2 diabetes mellitus with diabetic peripheral angiopathy with gangrene: Secondary | ICD-10-CM | POA: Diagnosis present

## 2016-06-20 DIAGNOSIS — N185 Chronic kidney disease, stage 5: Secondary | ICD-10-CM | POA: Diagnosis not present

## 2016-06-20 DIAGNOSIS — IMO0002 Reserved for concepts with insufficient information to code with codable children: Secondary | ICD-10-CM | POA: Diagnosis present

## 2016-06-20 DIAGNOSIS — Z79899 Other long term (current) drug therapy: Secondary | ICD-10-CM | POA: Diagnosis not present

## 2016-06-20 DIAGNOSIS — M868X7 Other osteomyelitis, ankle and foot: Secondary | ICD-10-CM | POA: Diagnosis not present

## 2016-06-20 DIAGNOSIS — M86171 Other acute osteomyelitis, right ankle and foot: Secondary | ICD-10-CM | POA: Diagnosis not present

## 2016-06-20 DIAGNOSIS — Z7951 Long term (current) use of inhaled steroids: Secondary | ICD-10-CM | POA: Diagnosis not present

## 2016-06-20 DIAGNOSIS — E1122 Type 2 diabetes mellitus with diabetic chronic kidney disease: Secondary | ICD-10-CM | POA: Diagnosis present

## 2016-06-20 DIAGNOSIS — L97509 Non-pressure chronic ulcer of other part of unspecified foot with unspecified severity: Secondary | ICD-10-CM

## 2016-06-20 DIAGNOSIS — Z8249 Family history of ischemic heart disease and other diseases of the circulatory system: Secondary | ICD-10-CM | POA: Diagnosis not present

## 2016-06-20 DIAGNOSIS — N2581 Secondary hyperparathyroidism of renal origin: Secondary | ICD-10-CM | POA: Diagnosis present

## 2016-06-20 DIAGNOSIS — Z6841 Body Mass Index (BMI) 40.0 and over, adult: Secondary | ICD-10-CM | POA: Diagnosis not present

## 2016-06-20 LAB — GLUCOSE, CAPILLARY
GLUCOSE-CAPILLARY: 124 mg/dL — AB (ref 65–99)
Glucose-Capillary: 146 mg/dL — ABNORMAL HIGH (ref 65–99)
Glucose-Capillary: 84 mg/dL (ref 65–99)
Glucose-Capillary: 124 mg/dL — ABNORMAL HIGH (ref 65–99)

## 2016-06-20 LAB — CBC WITH DIFFERENTIAL/PLATELET
Basophils Absolute: 0.1 10*3/uL (ref 0.0–0.1)
Basophils Relative: 1 %
Eosinophils Absolute: 0.3 10*3/uL (ref 0.0–0.7)
Eosinophils Relative: 6 %
HEMATOCRIT: 33.9 % — AB (ref 36.0–46.0)
HEMOGLOBIN: 10.2 g/dL — AB (ref 12.0–15.0)
LYMPHS ABS: 0.9 10*3/uL (ref 0.7–4.0)
LYMPHS PCT: 17 %
MCH: 31.5 pg (ref 26.0–34.0)
MCHC: 30.1 g/dL (ref 30.0–36.0)
MCV: 104.6 fL — AB (ref 78.0–100.0)
MONO ABS: 0.5 10*3/uL (ref 0.1–1.0)
MONOS PCT: 9 %
NEUTROS ABS: 3.5 10*3/uL (ref 1.7–7.7)
NEUTROS PCT: 67 %
Platelets: 213 10*3/uL (ref 150–400)
RBC: 3.24 MIL/uL — ABNORMAL LOW (ref 3.87–5.11)
RDW: 18.1 % — AB (ref 11.5–15.5)
WBC: 5.2 10*3/uL (ref 4.0–10.5)

## 2016-06-20 LAB — I-STAT CHEM 8, ED
BUN: 26 mg/dL — AB (ref 6–20)
CALCIUM ION: 1.03 mmol/L — AB (ref 1.15–1.40)
CHLORIDE: 92 mmol/L — AB (ref 101–111)
CREATININE: 3.7 mg/dL — AB (ref 0.44–1.00)
GLUCOSE: 193 mg/dL — AB (ref 65–99)
HCT: 38 % (ref 36.0–46.0)
Hemoglobin: 12.9 g/dL (ref 12.0–15.0)
POTASSIUM: 3.8 mmol/L (ref 3.5–5.1)
Sodium: 135 mmol/L (ref 135–145)
TCO2: 32 mmol/L (ref 0–100)

## 2016-06-20 LAB — MRSA PCR SCREENING: MRSA by PCR: NEGATIVE

## 2016-06-20 LAB — SEDIMENTATION RATE: Sed Rate: 28 mm/hr — ABNORMAL HIGH (ref 0–22)

## 2016-06-20 LAB — PREALBUMIN: PREALBUMIN: 21.8 mg/dL (ref 18–38)

## 2016-06-20 LAB — HIV ANTIBODY (ROUTINE TESTING W REFLEX): HIV Screen 4th Generation wRfx: NONREACTIVE

## 2016-06-20 LAB — C-REACTIVE PROTEIN: CRP: 0.8 mg/dL (ref <1.0)

## 2016-06-20 MED ORDER — CARVEDILOL 25 MG PO TABS
25.0000 mg | ORAL_TABLET | Freq: Two times a day (BID) | ORAL | Status: DC
  Administered 2016-06-20 – 2016-06-26 (×12): 25 mg via ORAL
  Filled 2016-06-20 (×6): qty 1
  Filled 2016-06-20: qty 2
  Filled 2016-06-20 (×5): qty 1

## 2016-06-20 MED ORDER — ALBUTEROL SULFATE (2.5 MG/3ML) 0.083% IN NEBU: 2.5000 mg | INHALATION_SOLUTION | Freq: Four times a day (QID) | RESPIRATORY_TRACT | Status: DC | PRN

## 2016-06-20 MED ORDER — MUPIROCIN CALCIUM 2 % EX CREA
TOPICAL_CREAM | Freq: Every day | CUTANEOUS | Status: DC
  Administered 2016-06-20 – 2016-06-23 (×4): via TOPICAL
  Filled 2016-06-20 (×2): qty 15

## 2016-06-20 MED ORDER — METRONIDAZOLE IN NACL 5-0.79 MG/ML-% IV SOLN
500.0000 mg | Freq: Three times a day (TID) | INTRAVENOUS | Status: DC
  Administered 2016-06-20 – 2016-06-21 (×4): 500 mg via INTRAVENOUS
  Filled 2016-06-20 (×8): qty 100

## 2016-06-20 MED ORDER — INSULIN ASPART 100 UNIT/ML ~~LOC~~ SOLN
4.0000 [IU] | Freq: Three times a day (TID) | SUBCUTANEOUS | Status: DC
  Administered 2016-06-20 – 2016-06-22 (×6): 4 [IU] via SUBCUTANEOUS

## 2016-06-20 MED ORDER — INSULIN GLARGINE 100 UNIT/ML ~~LOC~~ SOLN
20.0000 [IU] | Freq: Every day | SUBCUTANEOUS | Status: DC
  Administered 2016-06-20 – 2016-06-21 (×2): 20 [IU] via SUBCUTANEOUS
  Filled 2016-06-20 (×4): qty 0.2

## 2016-06-20 MED ORDER — HYDROXYZINE HCL 25 MG PO TABS
25.0000 mg | ORAL_TABLET | Freq: Four times a day (QID) | ORAL | Status: DC
  Administered 2016-06-20 – 2016-06-26 (×23): 25 mg via ORAL
  Filled 2016-06-20 (×23): qty 1

## 2016-06-20 MED ORDER — ISOSORBIDE DINITRATE 20 MG PO TABS
20.0000 mg | ORAL_TABLET | Freq: Three times a day (TID) | ORAL | Status: DC
  Administered 2016-06-20 – 2016-06-26 (×15): 20 mg via ORAL
  Filled 2016-06-20 (×22): qty 1

## 2016-06-20 MED ORDER — VANCOMYCIN HCL 10 G IV SOLR
2000.0000 mg | Freq: Once | INTRAVENOUS | Status: AC
  Administered 2016-06-20: 2000 mg via INTRAVENOUS
  Filled 2016-06-20: qty 2000

## 2016-06-20 MED ORDER — INSULIN ASPART 100 UNIT/ML ~~LOC~~ SOLN
0.0000 [IU] | Freq: Three times a day (TID) | SUBCUTANEOUS | Status: DC
  Administered 2016-06-20 – 2016-06-23 (×7): 1 [IU] via SUBCUTANEOUS

## 2016-06-20 MED ORDER — ALBUTEROL SULFATE HFA 108 (90 BASE) MCG/ACT IN AERS
2.0000 | INHALATION_SPRAY | Freq: Four times a day (QID) | RESPIRATORY_TRACT | Status: DC | PRN
Start: 2016-06-20 — End: 2016-06-20

## 2016-06-20 MED ORDER — ENSURE ENLIVE PO LIQD: 237.0000 mL | Freq: Two times a day (BID) | ORAL | Status: DC

## 2016-06-20 MED ORDER — OXYCODONE HCL 5 MG PO TABS
10.0000 mg | ORAL_TABLET | Freq: Once | ORAL | Status: AC
  Administered 2016-06-20: 10 mg via ORAL
  Filled 2016-06-20: qty 2

## 2016-06-20 MED ORDER — ATORVASTATIN CALCIUM 40 MG PO TABS
40.0000 mg | ORAL_TABLET | Freq: Every day | ORAL | Status: DC
  Administered 2016-06-20 – 2016-06-25 (×6): 40 mg via ORAL
  Filled 2016-06-20 (×7): qty 1

## 2016-06-20 MED ORDER — VANCOMYCIN HCL IN DEXTROSE 1-5 GM/200ML-% IV SOLN
1000.0000 mg | INTRAVENOUS | Status: DC
  Administered 2016-06-21: 1 g via INTRAVENOUS
  Administered 2016-06-24: 1000 mg via INTRAVENOUS
  Filled 2016-06-20 (×3): qty 200

## 2016-06-20 MED ORDER — HEPARIN SODIUM (PORCINE) 5000 UNIT/ML IJ SOLN
5000.0000 [IU] | Freq: Three times a day (TID) | INTRAMUSCULAR | Status: DC
  Administered 2016-06-20 – 2016-06-24 (×11): 5000 [IU] via SUBCUTANEOUS
  Filled 2016-06-20 (×15): qty 1

## 2016-06-20 MED ORDER — BUDESONIDE 0.25 MG/2ML IN SUSP: 0.2500 mg | Freq: Two times a day (BID) | RESPIRATORY_TRACT | Status: DC | PRN

## 2016-06-20 MED ORDER — SEVELAMER CARBONATE 800 MG PO TABS
800.0000 mg | ORAL_TABLET | Freq: Three times a day (TID) | ORAL | Status: DC
  Administered 2016-06-20 – 2016-06-21 (×5): 800 mg via ORAL
  Filled 2016-06-20 (×5): qty 1

## 2016-06-20 MED ORDER — CALCITRIOL 0.25 MCG PO CAPS
0.2500 ug | ORAL_CAPSULE | Freq: Every day | ORAL | Status: DC
  Administered 2016-06-20: 0.25 ug via ORAL
  Filled 2016-06-20: qty 1

## 2016-06-20 MED ORDER — INSULIN ASPART 100 UNIT/ML FLEXPEN: 12.0000 [IU] | PEN_INJECTOR | Freq: Three times a day (TID) | SUBCUTANEOUS | Status: DC

## 2016-06-20 MED ORDER — DEXTROSE 5 % IV SOLN
2.0000 g | INTRAVENOUS | Status: DC
  Administered 2016-06-20 – 2016-06-21 (×2): 2 g via INTRAVENOUS
  Filled 2016-06-20 (×4): qty 2

## 2016-06-20 MED ORDER — NEPRO/CARBSTEADY PO LIQD
237.0000 mL | Freq: Every day | ORAL | Status: DC
  Administered 2016-06-20 – 2016-06-26 (×5): 237 mL via ORAL
  Filled 2016-06-20 (×8): qty 237

## 2016-06-20 NOTE — Progress Notes (Signed)
Interval Progress Note  [facility]   Brief summary:  20 mL-year-old female with past medical history of diabetes, ESRD on HD, admitted for infected wound and cellulitis of the right great toe. Placed on IV antibiotics Rocephin, vancomycin, Flagyl. Wound care tolerated the patient and recommended no further interventions.  Patient seen and examined at bedside. Patient feels some improvement, but right great toe continues to be painful. Denies chest pain, shortness of breath, nausea, vomiting, and dizziness. Patient is able to ambulate.  Vital signs stable Right great toe with hematoma around the nail, small wound at the medial site with no with no pus noted. Very tender to touch. Erythematosus and swollen.  A/P Right great toe cellulitis and infected wound in a diabetic setting. We'll continue with current plan changes at the moment. For further details see H&P.   Latrelle Dodrill , MD  [facility] Pager [phone]

## 2016-06-20 NOTE — Progress Notes (Signed)
Initial Nutrition Assessment  DOCUMENTATION CODES:   Morbid obesity  INTERVENTION:   Provide Nepro oral nutrition supplement qd. Each supplement provides 425 kcal and 19.1 grams protein.  Monitor PO intake and adjust supplementation as needed.  NUTRITION DIAGNOSIS:   Increased nutrient needs related to wound healing as evidenced by estimated needs.  GOAL:   Patient will meet greater than or equal to 90% of their needs  MONITOR:   PO intake, Supplement acceptance, Labs, Weight trends, Skin  REASON FOR ASSESSMENT:   Consult  ASSESSMENT:   35 y.o. female with medical history significant of DM1, ESRD with dialysis TTS, type 2 MI, normal coronaries on cath in 2014. Patient presents to the ED with c/o gradual onset, persistent, and progressively worsening R great toe ulcer and infection. She has been seeing a podiatrist as an outpatient for this multiple times over the past week. Clinda and Keflex have resulted in no improvement. Wound debrided several days ago, continues to worsen.  Spoke with pt and significant other at bedside. Pt consuming lunch meal at time of visit. Per chart, pt has been consuming 100% of meals. At time of visit, pt had consumed about 75% of meal.  Pt reports moderate appetite during admission and PTA. Pt states she has been eating more in the hospital than PTA because "people have been telling [her] to eat more." Pt reports consuming 2 meals PTA (breakfast: 2 pieces of toast with strawberry jelly, lunch/dinner: fish or other seafood, salad/broccoli/spinach, and baked potato). Pt reveals consuming a few snacks throughout the day and at bedtime to prevent hypoglycemia such as crackers or half of a sandwich. Pt reveals a recent hypoglycemic episode in which her 35 year old daughter had to call 911. Pt reports blood sugar was under control PTA with the exception of hypoglycemic episodes.  Discussed with pt the importance of adequate protein and kcal in the diet for  wound healing. Pt receptive. Spoke with pt about adding supplements to her diet while in the hospital. Pt agreeable and states she has tried and tolerated Nepro PTA. Pt states she will also try Pro-stat if needed.  Pt reports losing weight in the past few months. Pt reports that weight loss was desirable but not intentional. Pt reveals that her physicians have been praising this weight loss.  Medications reviewed and include heparin, novoLOG, Lantus Labs reviewed and include elevated BUN (26), elevated creatinine (3.70), low ionized calcium (1.03), low chloride (92) CBGs: 84-185 NFPE: Exam performed. No fat depletion, mild muscle depletion, and mild edema noted.  Diet Order:  Diet Heart Room service appropriate? Yes; Fluid consistency: Thin  Skin:  Wound (see comment) (Rt great toe ulcer and infection)  Last BM:  Unknown  Height:   Ht Readings from Last 1 Encounters:  06/19/16 5\' 8"  (1.727 m)    Weight:   Wt Readings from Last 1 Encounters:  06/19/16 263 lb (119.3 kg)    Ideal Body Weight:  63.6 kg  BMI:  Body mass index is 39.99 kg/m.  Estimated Nutritional Needs:   Kcal:  2000-2200  Protein:  110-125 grams  Fluid:  per MD  EDUCATION NEEDS:   No education needs identified at this time  Rosemarie Ax Dietetic Intern Pager Number: [phone]

## 2016-06-20 NOTE — ED Notes (Signed)
Attempted to draw second set of blood cultures at this time without difficulty.  Attempt to draw cultures unsuccessful.

## 2016-06-20 NOTE — ED Notes (Signed)
RN attempted several times for second cultures and Hemoglobin A1C  with no success; Antibiotics will be sent with pt to floor; 1st cultures set and other labs drawn by ED RN and sent

## 2016-06-20 NOTE — ED Provider Notes (Signed)
[facility]-EMERGENCY DEPT Provider Note   CSN: [id number] Arrival date & time: 06/19/16  2054     History   Chief Complaint Chief Complaint  Patient presents with  . Toe Injury    HPI [name] is a 35 y.o. female with a hx of Anemia, arthritis, asthma, COPD, CAD, ESRD on hemodialysis Tuesday very states Saturday, hypertension, MI, insulin-dependent diabetes presents to the Emergency Department complaining of gradual, persistent, progressively worsening right great toe infection. The patient has been seen by her podiatrist multiple times over the last week. She has been taking clindamycin and Keflex without improvement. She reports that the wound was debrided several days ago and has continued to worsen. Patient also has several small wounds to the left fourth and fifth toes as well. She denies systemic symptoms. She reports compliance with her dialysis.  The history is provided by the patient and medical records. No language interpreter was used.    Past Medical History:  Diagnosis Date  . Anemia   . Arthritis    "left hand" (09/15/2013)  . Asthma   . Chronic bronchitis (HCC)    "just about q yr" (09/15/2013)  . Chronic kidney disease    "low kidney function" (09/15/2013)  . COPD (chronic obstructive pulmonary disease) (HCC)   . Coronary artery disease   . Hyperlipidemia   . Hypertension   . Migraine    "get them alot" (09/15/2013)  . Myocardial infarction [facility]) 04/2015   NSTEMI  . Normal coronary arteries    by cardiac catheterization 09/20/13  . Pneumonia    "couple times; have it now" (09/15/2013)  . Shortness of breath    "just recently; related to the pneumonia" (09/15/2013)  . Type 1 diabetes mellitus (HCC)    type 2    Patient Active Problem List   Diagnosis Date Noted  . Diabetic foot infection (HCC) 06/20/2016  . Pre-operative clearance 10/12/2015  . Normal coronary arteries 10/12/2015  . NSTEMI- type 2, Troponin 11.2 05/11/2015  . Respiratory failure  requiring intubation (HCC) 05/10/2015  . History of arthroscopy of right shoulder-05/10/15 05/10/2015  . Cellulitis of right lower extremity 09/21/2014  . CKD (chronic kidney disease) stage 5, GFR less than 15 ml/min (HCC) 09/21/2014  . Cellulitis of foot, right 09/21/2014  . Unspecified asthma(493.90) 10/25/2013  . Acute combined systolic and diastolic heart failure (HCC) 09/21/2013  . Non-ischemic cardiomyopathy - by echo 8/16- EF 35-40% 09/16/2013  . Morbid obesity-  09/16/2013  . 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) 09/15/2013  . Normocytic anemia 09/15/2013  . Lower extremity edema 09/15/2013  . Hyperlipidemia   . Hypertension     Past Surgical History:  Procedure Laterality Date  . AV FISTULA PLACEMENT Left 03/27/2015   Procedure: CREATION RADIAL CEPHALIC ARTERIOVENOUS FISTULA;  Surgeon: Chuck Hint, MD;  Location: [facility] OR;  Service: Vascular;  Laterality: Left;  . AV FISTULA PLACEMENT Left 11/23/2015   Procedure:  LEFT ARM BASILIC VEIN TRANSPOSITION  ;  Surgeon: Chuck Hint, MD;  Location: [facility] OR;  Service: Vascular;  Laterality: Left;  . CESAREAN SECTION  1999; 2006  . CORONARY ANGIOGRAM  09/20/2013   Procedure: CORONARY ANGIOGRAM;  Surgeon: Runell Gess, MD;  Location: [facility] CATH LAB;  Service: Cardiovascular;;  . FINGER SURGERY Left 1985   3rd and 4th digits reconstructed after cut off" (09/15/2013)  . SHOULDER ARTHROSCOPY WITH BICEPSTENOTOMY Right 05/10/2015   Procedure: RIGHT SHOULDER ARTHROSCOPY WITH BICEPS TENOTOMY, DEBRIDEMENT LABRAL TEAR;  Surgeon: Jones Broom,  MD;  Location: [facility] OR;  Service: Orthopedics;  Laterality: Right;  Right shoulder arthroscopy biceps tenotomy, debridement labral tear  . TONSILLECTOMY  1997  . TUBAL LIGATION  2006    OB History    No data available       Home Medications    Prior to Admission medications   Medication Sig Start Date End Date Taking? Authorizing Provider  albuterol (PROVENTIL  HFA;VENTOLIN HFA) 108 (90 BASE) MCG/ACT inhaler Inhale 2 puffs into the lungs every 6 (six) hours as needed for wheezing or shortness of breath.    Yes Historical Provider, MD  albuterol (PROVENTIL) (2.5 MG/3ML) 0.083% nebulizer solution Take 2.5 mg by nebulization every 6 (six) hours as needed for wheezing or shortness of breath.   Yes Historical Provider, MD  atorvastatin (LIPITOR) 40 MG tablet Take 40 mg by mouth at bedtime.   Yes Historical Provider, MD  budesonide (PULMICORT) 0.25 MG/2ML nebulizer solution Take 0.25 mg by nebulization 2 (two) times daily as needed (shortness of breath or wheezing).   Yes Historical Provider, MD  calcitRIOL (ROCALTROL) 0.25 MCG capsule Take 0.25 mcg by mouth daily.   Yes Historical Provider, MD  carvedilol (COREG) 25 MG tablet Take 25 mg by mouth 2 (two) times daily.   Yes Historical Provider, MD  hydrOXYzine (ATARAX/VISTARIL) 25 MG tablet Take 1 tablet (25 mg total) by mouth every 6 (six) hours. 03/30/16  Yes Rolland Porter, MD  insulin aspart (NOVOLOG FLEXPEN) 100 UNIT/ML FlexPen Inject 12-20 Units into the skin 3 (three) times daily with meals. Per sliding scale - based on carb count and CBG   Yes Historical Provider, MD  Insulin Glargine (LANTUS SOLOSTAR) 100 UNIT/ML Solostar Pen Inject 20 Units into the skin at bedtime.   Yes Historical Provider, MD  isosorbide dinitrate (ISORDIL) 20 MG tablet Take 20 mg by mouth 3 (three) times daily.   Yes Historical Provider, MD  sevelamer (RENAGEL) 800 MG tablet Take 800 mg by mouth daily.   Yes Historical Provider, MD  hydrALAZINE (APRESOLINE) 25 MG tablet Take 1.5 tablets (37.5 mg total) by mouth every 8 (eight) hours. Patient not taking: Reported on 06/20/2016 04/03/14   Doris Cheadle, MD  oxyCODONE-acetaminophen (PERCOCET/ROXICET) 5-325 MG tablet Take 1 tablet by mouth every 6 (six) hours as needed. Patient not taking: Reported on 06/20/2016 11/23/15   Lars Mage, PA-C  predniSONE (DELTASONE) 20 MG tablet Take 1 tablet (20  mg total) by mouth 2 (two) times daily with a meal. Patient not taking: Reported on 06/20/2016 03/30/16   Rolland Porter, MD    Family History Family History  Problem Relation Age of Onset  . Diabetes Mother   . Stroke Mother   . Hypertension Father   . Hyperlipidemia Father   . Cancer - Lung Father   . Diabetes Maternal Grandmother   . Cancer Paternal Grandmother     Social History Social History  Substance Use Topics  . Smoking status: Never Smoker  . Smokeless tobacco: Never Used  . Alcohol use No     Allergies   Aspirin; Sulfur; and Tramadol   Review of Systems Review of Systems  Skin: Positive for wound ( Right great toe, left fourth and fifth toes).  All other systems reviewed and are negative.    Physical Exam Updated Vital Signs BP 142/93   Pulse 86   Temp 98.3 F (36.8 C)   Resp 18   Ht 5\' 8"  (1.727 m)   Wt 119.3 kg   LMP  06/08/2016   SpO2 94%   BMI 39.99 kg/m   Physical Exam  Constitutional: She appears well-developed and well-nourished. No distress.  Awake, alert, nontoxic appearance  HENT:  Head: Normocephalic and atraumatic.  Mouth/Throat: Oropharynx is clear and moist. No oropharyngeal exudate.  Eyes: Conjunctivae are normal. No scleral icterus.  Neck: Normal range of motion. Neck supple.  Cardiovascular: Normal rate, regular rhythm and intact distal pulses.   Pulmonary/Chest: Effort normal and breath sounds normal. No respiratory distress. She has no wheezes.  Equal chest expansion  Abdominal: Soft. Bowel sounds are normal. She exhibits no mass. There is no tenderness. There is no rebound and no guarding.  Musculoskeletal: Normal range of motion. She exhibits no edema.  Left upper extremity with hemodialysis fistula, palpable thrill  Right foot: Right great toe with multiple open wounds, purulent drainage, erythema and maceration of the skin.  Erythema is tracking up into the distal forefoot.  Left foot: Several superficial wounds to the  fourth and fifth toes without surrounding erythema, induration or purulent drainage.  Neurological: She is alert.  Speech is clear and goal oriented Moves extremities without ataxia  Skin: Skin is warm and dry. She is not diaphoretic.  Psychiatric: She has a normal mood and affect.  Nursing note and vitals reviewed.    ED Treatments / Results  Labs (all labs ordered are listed, but only abnormal results are displayed) Labs Reviewed  CBC WITH DIFFERENTIAL/PLATELET - Abnormal; Notable for the following:       Result Value   RBC 3.24 (*)    Hemoglobin 10.2 (*)    HCT 33.9 (*)    MCV 104.6 (*)    RDW 18.1 (*)    All other components within normal limits  SEDIMENTATION RATE - Abnormal; Notable for the following:    Sed Rate 28 (*)    All other components within normal limits  CBG MONITORING, ED - Abnormal; Notable for the following:    Glucose-Capillary 185 (*)    All other components within normal limits  I-STAT CHEM 8, ED - Abnormal; Notable for the following:    Chloride 92 (*)    BUN 26 (*)    Creatinine, Ser 3.70 (*)    Glucose, Bld 193 (*)    Calcium, Ion 1.03 (*)    All other components within normal limits  CULTURE, BLOOD (ROUTINE X 2)  CULTURE, BLOOD (ROUTINE X 2)  C-REACTIVE PROTEIN  HEMOGLOBIN A1C  HIV ANTIBODY (ROUTINE TESTING)  PREALBUMIN  BASIC METABOLIC PANEL    Radiology No results found.  Procedures Procedures (including critical care time)  Medications Ordered in ED Medications  oxyCODONE (Oxy IR/ROXICODONE) immediate release tablet 10 mg (not administered)  cefTRIAXone (ROCEPHIN) 2 g in dextrose 5 % 50 mL IVPB (not administered)    And  metroNIDAZOLE (FLAGYL) IVPB 500 mg (not administered)     Initial Impression / Assessment and Plan / ED Course  I have reviewed the triage vital signs and the nursing notes.  Pertinent labs & imaging results that were available during my care of the patient were reviewed by me and considered in my medical  decision making (see chart for details).  Clinical Course  Value Comment By Time  Sed Rate: (!) 28 elevated Dierdre Forth, PA-C 09/22 0321  Creatinine: (!) 3.70 Elevated - ESRD on hemodialysis Dierdre Forth, PA-C 09/22 0321  WBC: 5.2 No leukocytosis Nazia Rhines, PA-C 09/22 0322  MAP (mmHg): 106 Vital signs stable. No tachycardia or fever. Dahlia Client Jonisha Kindig,  PA-C 09/22 0322   Discussed with Dr. Julian ReilGardner who will admit.   Dahlia ClientHannah Fardowsa Authier, PA-C 09/22 [phone]   Pt with insulin dependent diabetes, ESRD and persistent infection that has failed outpatient treatment. Patient will need IV antibiotics. Her labs are generally baseline. Elevated sedimentation rate at 28.  X-ray pending.    Final Clinical Impressions(s) / ED Diagnoses   Final diagnoses:  Diabetic foot infection [facility](HCC)    New Prescriptions New Prescriptions   No medications on file     Dierdre ForthHannah Catalina Salasar, PA-C 06/20/16 0337    Layla MawKristen N Ward, DO 06/20/16 [phone]

## 2016-06-20 NOTE — H&P (Signed)
History and Physical    Beth Douglasesha Righter NWG:[id number] DOB: Aug 02, 1981 DOA: 06/20/2016   PCP: Elizabeth PalauANDERSON,TERESA, FNP Chief Complaint:  Chief Complaint  Patient presents with  . Toe Injury    HPI: [name] is a 35 y.o. female with medical history significant of DM1, ESRD with dialysis TTS, type 2 MI, normal coronaries on cath in 2014.  Patient presents to the ED with c/o gradual onset, persistent, and progressively worsening R great toe ulcer and infection.  She has been seeing a podiatrist as an outpatient for this multiple times over the past week.  Clinda and Keflex have resulted in no improvement.  Wound debrided several days ago, continues to worsen.  ED Course: Put on rocephin and flagyl.  X ray not really remarkable.  Review of Systems: As per HPI otherwise 10 point review of systems negative.    Past Medical History:  Diagnosis Date  . Anemia   . Arthritis    "left hand" (09/15/2013)  . Asthma   . Chronic bronchitis (HCC)    "just about q yr" (09/15/2013)  . Chronic kidney disease    "low kidney function" (09/15/2013)  . COPD (chronic obstructive pulmonary disease) (HCC)   . Coronary artery disease   . Hyperlipidemia   . Hypertension   . Migraine    "get them alot" (09/15/2013)  . Myocardial infarction [facility](HCC) 04/2015   NSTEMI  . Normal coronary arteries    by cardiac catheterization 09/20/13  . Pneumonia    "couple times; have it now" (09/15/2013)  . Shortness of breath    "just recently; related to the pneumonia" (09/15/2013)  . Type 1 diabetes mellitus (HCC)    type 2    Past Surgical History:  Procedure Laterality Date  . AV FISTULA PLACEMENT Left 03/27/2015   Procedure: CREATION RADIAL CEPHALIC ARTERIOVENOUS FISTULA;  Surgeon: Chuck Hinthristopher S Dickson, MD;  [facility];  Service: Vascular;  Laterality: Left;  . AV FISTULA PLACEMENT Left 11/23/2015   Procedure:  LEFT ARM BASILIC VEIN TRANSPOSITION  ;  Surgeon: Chuck Hinthristopher S Dickson, MD;  [facility];  Service:  Vascular;  Laterality: Left;  . CESAREAN SECTION  1999; 2006  . CORONARY ANGIOGRAM  09/20/2013   Procedure: CORONARY ANGIOGRAM;  Surgeon: Runell GessJonathan J Berry, MD;  [facility];  Service: Cardiovascular;;  . FINGER SURGERY Left 1985   3rd and 4th digits reconstructed after cut off" (09/15/2013)  . SHOULDER ARTHROSCOPY WITH BICEPSTENOTOMY Right 05/10/2015   Procedure: RIGHT SHOULDER ARTHROSCOPY WITH BICEPS TENOTOMY, DEBRIDEMENT LABRAL TEAR;  Surgeon: Jones BroomJustin Chandler, MD;  Location: [facility] OR;  Service: Orthopedics;  Laterality: Right;  Right shoulder arthroscopy biceps tenotomy, debridement labral tear  . TONSILLECTOMY  1997  . TUBAL LIGATION  2006     reports that she has never smoked. She has never used smokeless tobacco. She reports that she does not drink alcohol or use drugs.  Allergies  Allergen Reactions  . Aspirin Anaphylaxis  . Sulfur Hives  . Tramadol Other (See Comments)    Pt states she feels weird     Family History  Problem Relation Age of Onset  . Diabetes Mother   . Stroke Mother   . Hypertension Father   . Hyperlipidemia Father   . Cancer - Lung Father   . Diabetes Maternal Grandmother   . Cancer Paternal Grandmother       Prior to Admission medications   Medication Sig Start Date End Date Taking? Authorizing Provider  albuterol (PROVENTIL HFA;VENTOLIN HFA) 108 (  90 BASE) MCG/ACT inhaler Inhale 2 puffs into the lungs every 6 (six) hours as needed for wheezing or shortness of breath.    Yes Historical Provider, MD  albuterol (PROVENTIL) (2.5 MG/3ML) 0.083% nebulizer solution Take 2.5 mg by nebulization every 6 (six) hours as needed for wheezing or shortness of breath.   Yes Historical Provider, MD  atorvastatin (LIPITOR) 40 MG tablet Take 40 mg by mouth at bedtime.   Yes Historical Provider, MD  budesonide (PULMICORT) 0.25 MG/2ML nebulizer solution Take 0.25 mg by nebulization 2 (two) times daily as needed (shortness of breath or wheezing).   Yes Historical  Provider, MD  calcitRIOL (ROCALTROL) 0.25 MCG capsule Take 0.25 mcg by mouth daily.   Yes Historical Provider, MD  carvedilol (COREG) 25 MG tablet Take 25 mg by mouth 2 (two) times daily.   Yes Historical Provider, MD  hydrOXYzine (ATARAX/VISTARIL) 25 MG tablet Take 1 tablet (25 mg total) by mouth every 6 (six) hours. 03/30/16  Yes Rolland Porter, MD  insulin aspart (NOVOLOG FLEXPEN) 100 UNIT/ML FlexPen Inject 12-20 Units into the skin 3 (three) times daily with meals. Per sliding scale - based on carb count and CBG   Yes Historical Provider, MD  Insulin Glargine (LANTUS SOLOSTAR) 100 UNIT/ML Solostar Pen Inject 20 Units into the skin at bedtime.   Yes Historical Provider, MD  isosorbide dinitrate (ISORDIL) 20 MG tablet Take 20 mg by mouth 3 (three) times daily.   Yes Historical Provider, MD  sevelamer (RENAGEL) 800 MG tablet Take 800 mg by mouth daily.   Yes Historical Provider, MD    Physical Exam: Vitals:   06/20/16 0215 06/20/16 0230 06/20/16 0245 06/20/16 0421  BP: 133/94 143/86 142/93 151/96  Pulse: 81 85 86   Resp:      Temp:      TempSrc:      SpO2: 96% 94% 94%   Weight:      Height:          Constitutional: NAD, calm, comfortable Eyes: PERRL, lids and conjunctivae normal ENMT: Mucous membranes are moist. Posterior pharynx clear of any exudate or lesions.Normal dentition.  Neck: normal, supple, no masses, no thyromegaly Respiratory: clear to auscultation bilaterally, no wheezing, no crackles. Normal respiratory effort. No accessory muscle use.  Cardiovascular: Regular rate and rhythm, no murmurs / rubs / gallops. No extremity edema. 2+ pedal pulses. No carotid bruits.  Abdomen: no tenderness, no masses palpated. No hepatosplenomegaly. Bowel sounds positive.  Musculoskeletal: no clubbing / cyanosis. No joint deformity upper and lower extremities. Good ROM, no contractures. Normal muscle tone.  Skin: no rashes, lesions, ulcers. No induration Neurologic: CN 2-12 grossly intact.  Sensation intact, DTR normal. Strength 5/5 in all 4.  Psychiatric: Normal judgment and insight. Alert and oriented x 3. Normal mood.    Labs on Admission: I have personally reviewed following labs and imaging studies  CBC:  Recent Labs Lab 06/20/16 0147 06/20/16 0205  WBC 5.2  --   NEUTROABS 3.5  --   HGB 10.2* 12.9  HCT 33.9* 38.0  MCV 104.6*  --   PLT 213  --    Basic Metabolic Panel:  Recent Labs Lab 06/20/16 0205  NA 135  K 3.8  CL 92*  GLUCOSE 193*  BUN 26*  CREATININE 3.70*   GFR: Estimated Creatinine Clearance: 28.6 mL/min (by C-G formula based on SCr of 3.7 mg/dL (H)). Liver Function Tests: No results for input(s): AST, ALT, ALKPHOS, BILITOT, PROT, ALBUMIN in the last 168 hours. No  results for input(s): LIPASE, AMYLASE in the last 168 hours. No results for input(s): AMMONIA in the last 168 hours. Coagulation Profile: No results for input(s): INR, PROTIME in the last 168 hours. Cardiac Enzymes: No results for input(s): CKTOTAL, CKMB, CKMBINDEX, TROPONINI in the last 168 hours. BNP (last 3 results) No results for input(s): PROBNP in the last 8760 hours. HbA1C: No results for input(s): HGBA1C in the last 72 hours. CBG:  Recent Labs Lab 06/19/16 2122  GLUCAP 185*   Lipid Profile: No results for input(s): CHOL, HDL, LDLCALC, TRIG, CHOLHDL, LDLDIRECT in the last 72 hours. Thyroid Function Tests: No results for input(s): TSH, T4TOTAL, FREET4, T3FREE, THYROIDAB in the last 72 hours. Anemia Panel: No results for input(s): VITAMINB12, FOLATE, FERRITIN, TIBC, IRON, RETICCTPCT in the last 72 hours. Urine analysis:    Component Value Date/Time   COLORURINE YELLOW 09/21/2014 1908   APPEARANCEUR CLOUDY (A) 09/21/2014 1908   LABSPEC 1.012 09/21/2014 1908   PHURINE 6.5 09/21/2014 1908   GLUCOSEU 250 (A) 09/21/2014 1908   HGBUR MODERATE (A) 09/21/2014 1908   BILIRUBINUR NEGATIVE 09/21/2014 1908   KETONESUR NEGATIVE 09/21/2014 1908   PROTEINUR >300 (A)  09/21/2014 1908   UROBILINOGEN 0.2 09/21/2014 1908   NITRITE NEGATIVE 09/21/2014 1908   LEUKOCYTESUR NEGATIVE 09/21/2014 1908   Sepsis Labs: @LABRCNTIP (procalcitonin:4,lacticidven:4) )No results found for this or any previous visit (from the past 240 hour(s)).   Radiological Exams on Admission: Dg Foot Complete Right  Result Date: 06/20/2016 CLINICAL DATA:  Subacute onset of ulceration at the right great toe. Initial encounter. EXAM: RIGHT FOOT COMPLETE - 3+ VIEW COMPARISON:  None. FINDINGS: There is no evidence of fracture or dislocation. There is no evidence of osseous erosion. The joint spaces are preserved. There is no evidence of talar subluxation; the subtalar joint is unremarkable in appearance. Scattered vascular calcifications are seen. The known soft tissue ulceration at the right great toe is not well characterized on radiograph. No radiopaque foreign bodies are seen. No significant soft tissue air is noted. Incidental note is made of a bipartite medial sesamoid of the great toe. An os peroneum is noted. IMPRESSION: 1. No evidence of fracture or dislocation. No evidence of osseous erosion. 2. Known ulceration not well characterized. No radiopaque foreign bodies seen. No significant soft tissue air seen. 3. Scattered vascular calcifications noted. 4. Bipartite medial sesamoid of the great toe. 5. Os peroneum noted. Electronically Signed   By: Roanna Raider M.D.   On: 06/20/2016 04:45    EKG: Independently reviewed.  Assessment/Plan Principal Problem:   Diabetic foot infection (HCC) Active Problems:   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)   Hypertension   CKD (chronic kidney disease) stage 5, GFR less than 15 ml/min (HCC)   ESRD (end stage renal disease) on dialysis [facility])   Diabetic foot ulcer (HCC)    1. Diabetic foot ulcer of R great toe, infected - 1. 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 2. Diabetic foot pathway 3. Wound care to eval 4. May wish to get ortho involved, but not clear that it needs further debridement at the moment. 2. IDDM - 1. Continue home lantus, mealtime, and SSI with carb counting. 2. Getting A1C, but of note patients last A1C earlier this year in May was actually good at 5.8, so presumably this regimen may be working well for her. 3. HTN - continue home  meds 4. ESRD - left voicemail with nephrology consult line for routine dialysis next on Sat if she remains inpatient   DVT prophylaxis: Heparin Dillon Code Status: Full Family Communication: None Consults called: None other than ESRD consult line voicemail Admission status: Admit to inpatient   [name] DO [facility] Pager [phone] from 7PM-7AM  If 7AM-7PM, please contact the day physician for the patient [email or web address] Password [facility]  06/20/2016, 5:06 AM

## 2016-06-20 NOTE — Progress Notes (Signed)
Pharmacy Antibiotic Note  [name] is a 35 y.o. female admitted on 06/20/2016 with R foot infection.  Pharmacy has been consulted for Vancomycin dosing.  Plan: Vancomycin 2 g IV now, then 1 g IV after each HD  Height: 5\' 8"  (172.7 cm) Weight: 263 lb (119.3 kg) IBW/kg (Calculated) : 63.9  Temp (24hrs), Avg:98.3 F (36.8 C), Min:98.2 F (36.8 C), Max:98.3 F (36.8 C)   Recent Labs Lab 06/20/16 0147 06/20/16 0205  WBC 5.2  --   CREATININE  --  3.70*    Estimated Creatinine Clearance: 28.6 mL/min (by C-G formula based on SCr of 3.7 mg/dL (H)).    Allergies  Allergen Reactions  . Aspirin Anaphylaxis  . Sulfur Hives  . Tramadol Other (See Comments)    Pt states she feels weird     Eddie Candle 06/20/2016 5:03 AM

## 2016-06-20 NOTE — Progress Notes (Signed)
Inpatient Diabetes Program Recommendations  AACE/ADA: New Consensus Statement on Inpatient Glycemic Control (2015)  Target Ranges:  Prepandial:   less than 140 mg/dL      Peak postprandial:   less than 180 mg/dL (1-2 hours)      Critically ill patients:  140 - 180 mg/dL   Lab Results  Component Value Date   GLUCAP 146 (H) 06/20/2016   HGBA1C 7.4 (H) 03/19/2015    Review of Glycemic Control  Results for ALYZAH, TREVINO (MRN [id number]) as of 06/20/2016 07:57  Ref. Range 06/19/2016 21:22 06/20/2016 07:25  Glucose-Capillary Latest Ref Range: 65 - 99 mg/dL 093 (H) 235 (H)    Diabetes history: noted Outpatient Diabetes medications: Lantus 20 units qhs, Novolog 12-20 units tid Current orders for Inpatient glycemic control: Lantus 20 units qhs, Novolog 4 units tid, Novolog correction 0-9 units tid  Inpatient Diabetes Program Recommendations: A1C in progress.   Please change diet to heart healthy/carb modified.  Susette Racer, RN, BA, MHA, CDE Diabetes Coordinator Inpatient Diabetes Program  [phone] (Team Pager) [phone] [facility] Office) 06/20/2016 7:59 AM

## 2016-06-20 NOTE — Consult Note (Signed)
WOC Nurse wound consult note Reason for Consult: Neuropathic ulcer to right great toe.  Has been unsuccessful on oral antibiotics, podiatry outpatient intervention.  Wound type:Infectious, neuropathic Left fifth metatarsal with abrasions, patient is unsure how this started Pressure Ulcer POA: Yes Measurement:Left fifth toe: 0.5 cm abrasion Right great toe induration and deep maroon discoloration to inner aspect 2 cm x 0.5 cm intact Right lateral toe 0.5 cm x 1 cm x 0.2 cm scabbed Wound BJS:[id number] Drainage (amount, consistency, odor) Minimal serosanguinous  No odor.  Periwound:Erythema and edema Dressing procedure/placement/frequency:Cleanse right great toe and left fifth toe with NS and pat gently dry.  APply Bactroban to wound bed.  Cover with 2x2 and tape.  Change daily.  Will not follow at this time.  Please re-consult if needed.  Maple Hudson RN BSN CWON Pager [phone]

## 2016-06-21 ENCOUNTER — Inpatient Hospital Stay (HOSPITAL_COMMUNITY): Payer: Medicaid Other

## 2016-06-21 DIAGNOSIS — E1069 Type 1 diabetes mellitus with other specified complication: Secondary | ICD-10-CM

## 2016-06-21 DIAGNOSIS — N186 End stage renal disease: Secondary | ICD-10-CM

## 2016-06-21 DIAGNOSIS — Z992 Dependence on renal dialysis: Secondary | ICD-10-CM

## 2016-06-21 DIAGNOSIS — B9689 Other specified bacterial agents as the cause of diseases classified elsewhere: Secondary | ICD-10-CM

## 2016-06-21 DIAGNOSIS — E1021 Type 1 diabetes mellitus with diabetic nephropathy: Secondary | ICD-10-CM

## 2016-06-21 DIAGNOSIS — L0889 Other specified local infections of the skin and subcutaneous tissue: Secondary | ICD-10-CM

## 2016-06-21 DIAGNOSIS — E1022 Type 1 diabetes mellitus with diabetic chronic kidney disease: Secondary | ICD-10-CM

## 2016-06-21 DIAGNOSIS — L97519 Non-pressure chronic ulcer of other part of right foot with unspecified severity: Secondary | ICD-10-CM

## 2016-06-21 DIAGNOSIS — L98499 Non-pressure chronic ulcer of skin of other sites with unspecified severity: Secondary | ICD-10-CM

## 2016-06-21 DIAGNOSIS — E10621 Type 1 diabetes mellitus with foot ulcer: Secondary | ICD-10-CM

## 2016-06-21 LAB — RENAL FUNCTION PANEL
Albumin: 3.3 g/dL — ABNORMAL LOW (ref 3.5–5.0)
Anion gap: 12 (ref 5–15)
BUN: 25 mg/dL — ABNORMAL HIGH (ref 6–20)
CO2: 27 mmol/L (ref 22–32)
Calcium: 9.2 mg/dL (ref 8.9–10.3)
Chloride: 99 mmol/L — ABNORMAL LOW (ref 101–111)
Creatinine, Ser: 3.69 mg/dL — ABNORMAL HIGH (ref 0.44–1.00)
GFR calc Af Amer: 17 mL/min — ABNORMAL LOW (ref >60)
GFR calc non Af Amer: 15 mL/min — ABNORMAL LOW (ref >60)
Glucose, Bld: 114 mg/dL — ABNORMAL HIGH (ref 65–99)
Phosphorus: 3.9 mg/dL (ref 2.5–4.6)
Potassium: 4.2 mmol/L (ref 3.5–5.1)
Sodium: 138 mmol/L (ref 135–145)

## 2016-06-21 LAB — GLUCOSE, CAPILLARY
GLUCOSE-CAPILLARY: 191 mg/dL — AB (ref 65–99)
Glucose-Capillary: 104 mg/dL — ABNORMAL HIGH (ref 65–99)
Glucose-Capillary: 138 mg/dL — ABNORMAL HIGH (ref 65–99)

## 2016-06-21 LAB — HEMOGLOBIN A1C
HEMOGLOBIN A1C: 5.1 % (ref 4.8–5.6)
Mean Plasma Glucose: 100 mg/dL

## 2016-06-21 MED ORDER — ALTEPLASE 2 MG IJ SOLR: 2.0000 mg | Freq: Once | INTRAMUSCULAR | Status: DC | PRN

## 2016-06-21 MED ORDER — LIDOCAINE-PRILOCAINE 2.5-2.5 % EX CREA: 1.0000 "application " | TOPICAL_CREAM | CUTANEOUS | Status: DC | PRN

## 2016-06-21 MED ORDER — HEPARIN SODIUM (PORCINE) 1000 UNIT/ML DIALYSIS
100.0000 [IU]/kg | INTRAMUSCULAR | Status: DC | PRN
  Filled 2016-06-21: qty 12

## 2016-06-21 MED ORDER — DEXTROSE 5 % IV SOLN
2.0000 g | INTRAVENOUS | Status: DC
  Administered 2016-06-21 – 2016-06-24 (×2): 2 g via INTRAVENOUS
  Filled 2016-06-21 (×3): qty 2

## 2016-06-21 MED ORDER — OXYCODONE HCL 5 MG PO TABS
10.0000 mg | ORAL_TABLET | Freq: Once | ORAL | Status: AC
  Administered 2016-06-21: 10 mg via ORAL
  Filled 2016-06-21: qty 2

## 2016-06-21 MED ORDER — CALCITRIOL 0.5 MCG PO CAPS
1.0000 ug | ORAL_CAPSULE | ORAL | Status: DC
  Administered 2016-06-21 – 2016-06-26 (×3): 1 ug via ORAL
  Filled 2016-06-21: qty 2

## 2016-06-21 MED ORDER — SODIUM CHLORIDE 0.9 % IV SOLN: 100.0000 mL | INTRAVENOUS | Status: DC | PRN

## 2016-06-21 MED ORDER — HEPARIN SODIUM (PORCINE) 1000 UNIT/ML DIALYSIS: 1000.0000 [IU] | INTRAMUSCULAR | Status: DC | PRN

## 2016-06-21 MED ORDER — OXYCODONE HCL 5 MG PO TABS
5.0000 mg | ORAL_TABLET | Freq: Four times a day (QID) | ORAL | Status: DC | PRN
  Administered 2016-06-21 – 2016-06-23 (×6): 5 mg via ORAL
  Filled 2016-06-21 (×6): qty 1

## 2016-06-21 MED ORDER — LIDOCAINE HCL (PF) 1 % IJ SOLN: 5.0000 mL | INTRAMUSCULAR | Status: DC | PRN

## 2016-06-21 MED ORDER — PENTAFLUOROPROP-TETRAFLUOROETH EX AERO: 1.0000 "application " | INHALATION_SPRAY | CUTANEOUS | Status: DC | PRN

## 2016-06-21 MED ORDER — VANCOMYCIN HCL IN DEXTROSE 1-5 GM/200ML-% IV SOLN
INTRAVENOUS | Status: AC
  Administered 2016-06-21: 1 g via INTRAVENOUS
  Filled 2016-06-21: qty 200

## 2016-06-21 MED ORDER — SEVELAMER CARBONATE 800 MG PO TABS
3200.0000 mg | ORAL_TABLET | Freq: Three times a day (TID) | ORAL | Status: DC
  Administered 2016-06-22 – 2016-06-26 (×8): 3200 mg via ORAL
  Filled 2016-06-21 (×9): qty 4

## 2016-06-21 NOTE — Progress Notes (Addendum)
PROGRESS NOTE [facility]   Deonna Krummel   ZOX:[id number] DOB: October 23, 1980  DOA: 06/20/2016 PCP: [name], FNP   Brief Narrative:   79 mL-year-old female with past medical history of diabetes type 1, ESRD on HD TTS,. Presented to the ED with progressively worsening of R great toe ulcer an infection, which failed abx therapy x 2 and had a procedure done by podiatry.  Admitted for infected wound and cellulitis of the right great toe. Placed on IV antibiotics Rocephin, vancomycin, Flagyl. Wound care tolerated the patient and recommended no further interventions  Subjective:  Patient seen and examined today with relatives at bedside. Continues to c/o toe pain, although report some improvement in swelling and erythema. For HD today. No acute events overnight.    Assessment & Plan:   Diabetic foot infection R great toe infected/cellulitis - Minimal improvement No WBC or fever  - Continue Abx tx  - ID consult  - Vanc trough  - Consider consult ortho if no improvement by tomorrow  - ABI - Bilateral ABI non-compressible - F/u blood cultures   DM type 1 -  CBG's during hospital stay well controlled - Continue current management  - Monitor CBG's   Morbid Obesity - Weight loss discussed  - Dietary consult  Hypertension - Stable  - Continue home medication s  ESRD (end stage renal disease) on dialysis [facility]) - Renal consulted for routine dialysis. - for HD today      DVT prophylaxis: Heparin sq Code Status: Full  Family Communication: family at bedside, plan discussed and explained in details  Disposition Plan: Anticipated discharge home when medically impropved  Consultants:   ID   Procedures:   ABI - Bilateral ABI non-compressible  Antimicrobials:  Rocephin 06/20/16   Flagyl 06/20/16  Vanco 06/20/16   Objective: Vitals:   06/20/16 1300 06/20/16 2012 06/21/16 0519 06/21/16 1207  BP: 125/80 (!) 150/90 124/74 (!) 130/94  Pulse: 85 88 89 86  Resp: 18 18  18 19   Temp: 97.6 F (36.4 C) 97.9 F (36.6 C) 97.6 F (36.4 C) 97.3 F (36.3 C)  TempSrc: Oral Oral Oral Oral  SpO2: 96% 99% 92% 94%  Weight:      Height:        Intake/Output Summary (Last 24 hours) at 06/21/16 1422 Last data filed at 06/20/16 1512  Gross per 24 hour  Intake              900 ml  Output                0 ml  Net              900 ml   Filed Weights   06/19/16 2059  Weight: 119.3 kg (263 lb)    Examination:  General exam: Appears calm and comfortable  Respiratory system: Clear to auscultation. No wheezes,crackle or rhonchi Cardiovascular system: S1 & S2 heard, RRR. No JVD, murmurs, rubs or gallops, pulses hard to feel. Gastrointestinal system: Abdomen is nondistended, soft and nontender. Central nervous system: Alert and oriented. No focal neurological deficits. Extremities: No pedal edema.Right great toe with hematoma around the nail, small wound at the medial site with no with no pus noted. Erythematous and mild swelling  Skin: Multiple scabs in all 4 extremities  Psychiatry: Judgement and insight appear normal. Mood & affect appropriate.    Data Reviewed: I have personally reviewed following labs and imaging studies  CBC:  Recent Labs Lab 06/20/16 0147 06/20/16 0205  WBC  5.2  --   NEUTROABS 3.5  --   HGB 10.2* 12.9  HCT 33.9* 38.0  MCV 104.6*  --   PLT 213  --    Basic Metabolic Panel:  Recent Labs Lab 06/20/16 0205  NA 135  K 3.8  CL 92*  GLUCOSE 193*  BUN 26*  CREATININE 3.70*   GFR: Estimated Creatinine Clearance: 28.6 mL/min (by C-G formula based on SCr of 3.7 mg/dL (H)). Liver Function Tests: No results for input(s): AST, ALT, ALKPHOS, BILITOT, PROT, ALBUMIN in the last 168 hours. No results for input(s): LIPASE, AMYLASE in the last 168 hours. No results for input(s): AMMONIA in the last 168 hours. Coagulation Profile: No results for input(s): INR, PROTIME in the last 168 hours. Cardiac Enzymes: No results for input(s):  CKTOTAL, CKMB, CKMBINDEX, TROPONINI in the last 168 hours. BNP (last 3 results) No results for input(s): PROBNP in the last 8760 hours. HbA1C:  Recent Labs  06/20/16 0627  HGBA1C 5.1   CBG:  Recent Labs Lab 06/20/16 1129 06/20/16 1636 06/20/16 2134 06/21/16 0558 06/21/16 1135  GLUCAP 84 124* 124* 104* 138*   Lipid Profile: No results for input(s): CHOL, HDL, LDLCALC, TRIG, CHOLHDL, LDLDIRECT in the last 72 hours. Thyroid Function Tests: No results for input(s): TSH, T4TOTAL, FREET4, T3FREE, THYROIDAB in the last 72 hours. Anemia Panel: No results for input(s): VITAMINB12, FOLATE, FERRITIN, TIBC, IRON, RETICCTPCT in the last 72 hours. Sepsis Labs: No results for input(s): PROCALCITON, LATICACIDVEN in the last 168 hours.  Recent Results (from the past 240 hour(s))  MRSA PCR Screening     Status: None   Collection Time: 06/20/16  6:31 AM  Result Value Ref Range Status   MRSA by PCR NEGATIVE NEGATIVE Final    Comment:        The GeneXpert MRSA Assay (FDA approved for NASAL specimens only), is one component of a comprehensive MRSA colonization surveillance program. It is not intended to diagnose MRSA infection nor to guide or monitor treatment for MRSA infections.       Radiology Studies: Dg Foot Complete Right  Result Date: 06/20/2016 CLINICAL DATA:  Subacute onset of ulceration at the right great toe. Initial encounter. EXAM: RIGHT FOOT COMPLETE - 3+ VIEW COMPARISON:  None. FINDINGS: There is no evidence of fracture or dislocation. There is no evidence of osseous erosion. The joint spaces are preserved. There is no evidence of talar subluxation; the subtalar joint is unremarkable in appearance. Scattered vascular calcifications are seen. The known soft tissue ulceration at the right great toe is not well characterized on radiograph. No radiopaque foreign bodies are seen. No significant soft tissue air is noted. Incidental note is made of a bipartite medial sesamoid of  the great toe. An os peroneum is noted. IMPRESSION: 1. No evidence of fracture or dislocation. No evidence of osseous erosion. 2. Known ulceration not well characterized. No radiopaque foreign bodies seen. No significant soft tissue air seen. 3. Scattered vascular calcifications noted. 4. Bipartite medial sesamoid of the great toe. 5. Os peroneum noted. Electronically Signed   By: Roanna RaiderJeffery  Chang M.D.   On: 06/20/2016 04:45     Scheduled Meds: . atorvastatin  40 mg Oral QHS  . calcitRIOL  1 mcg Oral Q T,Th,Sa-HD  . carvedilol  25 mg Oral BID WC  . cefTRIAXone (ROCEPHIN)  IV  2 g Intravenous Q24H   And  . metronidazole  500 mg Intravenous Q8H  . feeding supplement (NEPRO CARB STEADY)  237 mL Oral  QPC lunch  . heparin  5,000 Units Subcutaneous Q8H  . hydrOXYzine  25 mg Oral Q6H  . insulin aspart  0-9 Units Subcutaneous TID WC  . insulin aspart  4 Units Subcutaneous TID WC  . insulin glargine  20 Units Subcutaneous QHS  . isosorbide dinitrate  20 mg Oral TID  . mupirocin cream   Topical Daily  . sevelamer carbonate  3,200 mg Oral TID WC  . vancomycin  1,000 mg Intravenous Q T,Th,Sa-HD   Continuous Infusions:    LOS: 1 day    Latrelle Dodrill, MD [facility] Pager [phone]  If 7PM-7AM, please contact night-coverage [email or web address] Password [email or web address] 06/21/2016, 2:22 PM

## 2016-06-21 NOTE — Consult Note (Signed)
[facility]  Date of Admission:  06/20/2016  Date of Consult:  06/21/2016  Reason for Consult: Diabetic Foot Infection Referring Physician: Sharion Dove  Impression/Recommendation Diabetic Foot Infection DM1 ESRD  Would consider MRI Change her anbx to ceftaz and vanco with HD Suspect she will need vascular and ortho eval but would wait til she has MRI.  A1C 5.1%  HIV (-) 9-22  Thank you so much for this interesting consult,   Johny Sax (pager) [phone] [email or web address]  [name] is an 35 y.o. female.  HPI: 35 yo F with hx of DM1 (since 35 yo), ESRD (since June 2017), comes to ED on 9-22 with worsening R great toe ulceration. She developed a sore on her R great toe after a door scraped over the top of the toe. She was seen and started on clinda 2 weeks ago. After 3 days she was seen again and was not felt to be improving. She has had wound d/c and pain. Mild erythema.  She was then eval by podiatry and underwent I + D (9-18). She was started on keflex. She presented to the ED on 9-22 after her wound was worsening.  She was started on flagyl/ceftriaxone/vanco on adm.  She has been afebrile. Her WBC is normal. A plain film did not show osteomyelitis.  She had ABI that showed non-compressible vessels.   Past Medical History:  Diagnosis Date  . Anemia   . Arthritis    "left hand" (09/15/2013)  . Asthma   . Chronic bronchitis (HCC)    "just about q yr" (09/15/2013)  . Chronic kidney disease    "low kidney function" (09/15/2013)  . COPD (chronic obstructive pulmonary disease) (HCC)   . Coronary artery disease   . Hyperlipidemia   . Hypertension   . Migraine    "get them alot" (09/15/2013)  . Myocardial infarction [facility] 04/2015   NSTEMI  . Normal coronary arteries    by cardiac catheterization 09/20/13  . Pneumonia    "couple times; have it now" (09/15/2013)  . Shortness of breath    "just recently; related to the pneumonia"  (09/15/2013)  . Type 1 diabetes mellitus (HCC)    type 2    Past Surgical History:  Procedure Laterality Date  . AV FISTULA PLACEMENT Left 03/27/2015   Procedure: CREATION RADIAL CEPHALIC ARTERIOVENOUS FISTULA;  Surgeon: Chuck Hint, MD;  Location: [facility] OR;  Service: Vascular;  Laterality: Left;  . AV FISTULA PLACEMENT Left 11/23/2015   Procedure:  LEFT ARM BASILIC VEIN TRANSPOSITION  ;  Surgeon: Chuck Hint, MD;  Location: [facility] OR;  Service: Vascular;  Laterality: Left;  . CESAREAN SECTION  1999; 2006  . CORONARY ANGIOGRAM  09/20/2013   Procedure: CORONARY ANGIOGRAM;  Surgeon: Runell Gess, MD;  Location: [facility] CATH LAB;  Service: Cardiovascular;;  . FINGER SURGERY Left 1985   3rd and 4th digits reconstructed after cut off" (09/15/2013)  . SHOULDER ARTHROSCOPY WITH BICEPSTENOTOMY Right 05/10/2015   Procedure: RIGHT SHOULDER ARTHROSCOPY WITH BICEPS TENOTOMY, DEBRIDEMENT LABRAL TEAR;  Surgeon: Jones Broom, MD;  Location: [facility] OR;  Service: Orthopedics;  Laterality: Right;  Right shoulder arthroscopy biceps tenotomy, debridement labral tear  . TONSILLECTOMY  1997  . TUBAL LIGATION  2006     Allergies  Allergen Reactions  . Aspirin Anaphylaxis  . Sulfur Hives  . Tramadol Other (See Comments)    Pt states she feels weird     Medications:  Scheduled: . atorvastatin  40  mg Oral QHS  . calcitRIOL  1 mcg Oral Q T,Th,Sa-HD  . carvedilol  25 mg Oral BID WC  . cefTRIAXone (ROCEPHIN)  IV  2 g Intravenous Q24H   And  . metronidazole  500 mg Intravenous Q8H  . feeding supplement (NEPRO CARB STEADY)  237 mL Oral QPC lunch  . heparin  5,000 Units Subcutaneous Q8H  . hydrOXYzine  25 mg Oral Q6H  . insulin aspart  0-9 Units Subcutaneous TID WC  . insulin aspart  4 Units Subcutaneous TID WC  . insulin glargine  20 Units Subcutaneous QHS  . isosorbide dinitrate  20 mg Oral TID  . mupirocin cream   Topical Daily  . sevelamer carbonate  3,200 mg Oral TID WC  . vancomycin   1,000 mg Intravenous Q T,Th,Sa-HD    Abtx:  Anti-infectives    Start     Dose/Rate Route Frequency Ordered Stop   06/21/16 1200  vancomycin (VANCOCIN) IVPB 1000 mg/200 mL premix     1,000 mg 200 mL/hr over 60 Minutes Intravenous Every T-Th-Sa (Hemodialysis) 06/20/16 0507     06/20/16 0515  vancomycin (VANCOCIN) 2,000 mg in sodium chloride 0.9 % 500 mL IVPB     2,000 mg 250 mL/hr over 120 Minutes Intravenous  Once 06/20/16 0507 06/20/16 0800   06/20/16 0330  cefTRIAXone (ROCEPHIN) 2 g in dextrose 5 % 50 mL IVPB     2 g 100 mL/hr over 30 Minutes Intravenous Every 24 hours 06/20/16 0320     06/20/16 0330  metroNIDAZOLE (FLAGYL) IVPB 500 mg     500 mg 100 mL/hr over 60 Minutes Intravenous Every 8 hours 06/20/16 0320        Total days of antibiotics: 1 vanco/ceftriaxone/flagyl          Social History:  reports that she has never smoked. She has never used smokeless tobacco. She reports that she does not drink alcohol or use drugs.  Family History  Problem Relation Age of Onset  . Diabetes Mother   . Stroke Mother   . Hypertension Father   . Hyperlipidemia Father   . Cancer - Lung Father   . Diabetes Maternal Grandmother   . Cancer Paternal Grandmother     General ROS: little u/o, no problems with fistula, normal BM, normal vision, no nueropathy, no f/c, see HPI.   Blood pressure (!) 130/94, pulse 86, temperature 97.3 F (36.3 C), temperature source Oral, resp. rate 19, height 5\' 8"  (1.727 m), weight 119.3 kg (263 lb), last menstrual period 06/08/2016, SpO2 94 %. General appearance: alert, cooperative and no distress Eyes: negative findings: conjunctivae and sclerae normal and pupils equal, round, reactive to light and accomodation Throat: lips, mucosa, and tongue normal; teeth and gums normal Neck: no adenopathy and supple, symmetrical, trachea midline Lungs: clear to auscultation bilaterally Heart: regular rate and rhythm Abdomen: normal findings: bowel sounds normal and  soft, non-tender Extremities: edema minimal and R great toe with dressed wounds. tender. erythema. no purulence.    Results for orders placed or performed during the hospital encounter of 06/20/16 (from the past 48 hour(s))  CBG monitoring, ED     Status: Abnormal   Collection Time: 06/19/16  9:22 PM  Result Value Ref Range   Glucose-Capillary 185 (H) 65 - 99 mg/dL   Comment 1 Notify RN    Comment 2 Document in Chart   CBC with Differential/Platelet     Status: Abnormal   Collection Time: 06/20/16  1:47 AM  Result  Value Ref Range   WBC 5.2 4.0 - 10.5 K/uL   RBC 3.24 (L) 3.87 - 5.11 MIL/uL   Hemoglobin 10.2 (L) 12.0 - 15.0 g/dL   HCT 52.8 (L) 41.3 - 24.4 %   MCV 104.6 (H) 78.0 - 100.0 fL   MCH 31.5 26.0 - 34.0 pg   MCHC 30.1 30.0 - 36.0 g/dL   RDW 01.0 (H) 27.2 - 53.6 %   Platelets 213 150 - 400 K/uL   Neutrophils Relative % 67 %   Neutro Abs 3.5 1.7 - 7.7 K/uL   Lymphocytes Relative 17 %   Lymphs Abs 0.9 0.7 - 4.0 K/uL   Monocytes Relative 9 %   Monocytes Absolute 0.5 0.1 - 1.0 K/uL   Eosinophils Relative 6 %   Eosinophils Absolute 0.3 0.0 - 0.7 K/uL   Basophils Relative 1 %   Basophils Absolute 0.1 0.0 - 0.1 K/uL  Sedimentation rate     Status: Abnormal   Collection Time: 06/20/16  1:47 AM  Result Value Ref Range   Sed Rate 28 (H) 0 - 22 mm/hr  C-reactive protein     Status: None   Collection Time: 06/20/16  1:47 AM  Result Value Ref Range   CRP 0.8 <1.0 mg/dL  I-stat chem 8, ed     Status: Abnormal   Collection Time: 06/20/16  2:05 AM  Result Value Ref Range   Sodium 135 135 - 145 mmol/L   Potassium 3.8 3.5 - 5.1 mmol/L   Chloride 92 (L) 101 - 111 mmol/L   BUN 26 (H) 6 - 20 mg/dL   Creatinine, Ser 6.44 (H) 0.44 - 1.00 mg/dL   Glucose, Bld 034 (H) 65 - 99 mg/dL   Calcium, Ion 7.42 (L) 1.15 - 1.40 mmol/L   TCO2 32 0 - 100 mmol/L   Hemoglobin 12.9 12.0 - 15.0 g/dL   HCT 59.5 63.8 - 75.6 %  HIV antibody     Status: None   Collection Time: 06/20/16  4:25 AM    Result Value Ref Range   HIV Screen 4th Generation wRfx Non Reactive Non Reactive    Comment: (NOTE) Performed At: [facility] [address] [place], [place] [id number] Mila Homer MD [id number]   Prealbumin     Status: None   Collection Time: 06/20/16  4:25 AM  Result Value Ref Range   Prealbumin 21.8 18 - 38 mg/dL  Hemoglobin U9N     Status: None   Collection Time: 06/20/16  6:27 AM  Result Value Ref Range   Hgb A1c MFr Bld 5.1 4.8 - 5.6 %    Comment: (NOTE)         Pre-diabetes: 5.7 - 6.4         Diabetes: >6.4         Glycemic control for adults with diabetes: <7.0    Mean Plasma Glucose 100 mg/dL    Comment: (NOTE) Performed At: [facility] [address] [place], [place] [id number] Mila Homer MD [id number]   MRSA PCR Screening     Status: None   Collection Time: 06/20/16  6:31 AM  Result Value Ref Range   MRSA by PCR NEGATIVE NEGATIVE    Comment:        The GeneXpert MRSA Assay (FDA approved for NASAL specimens only), is one component of a comprehensive MRSA colonization surveillance program. It is not intended to diagnose MRSA infection nor to guide or monitor treatment for MRSA infections.   Glucose,  capillary     Status: Abnormal   Collection Time: 06/20/16  7:25 AM  Result Value Ref Range   Glucose-Capillary 146 (H) 65 - 99 mg/dL  Glucose, capillary     Status: None   Collection Time: 06/20/16 11:29 AM  Result Value Ref Range   Glucose-Capillary 84 65 - 99 mg/dL  Glucose, capillary     Status: Abnormal   Collection Time: 06/20/16  4:36 PM  Result Value Ref Range   Glucose-Capillary 124 (H) 65 - 99 mg/dL  Glucose, capillary     Status: Abnormal   Collection Time: 06/20/16  9:34 PM  Result Value Ref Range   Glucose-Capillary 124 (H) 65 - 99 mg/dL  Glucose, capillary     Status: Abnormal   Collection Time: 06/21/16  5:58 AM  Result Value Ref Range   Glucose-Capillary 104 (H) 65 - 99 mg/dL  Glucose, capillary      Status: Abnormal   Collection Time: 06/21/16 11:35 AM  Result Value Ref Range   Glucose-Capillary 138 (H) 65 - 99 mg/dL      Component Value Date/Time   SDES WOUND RIGHT ANKLE 09/21/2014 2133   SPECREQUEST NONE 09/21/2014 2133   CULT  09/21/2014 2133    MODERATE METHICILLIN RESISTANT STAPHYLOCOCCUS AUREUS Note: RIFAMPIN AND GENTAMICIN SHOULD NOT BE USED AS SINGLE DRUGS FOR TREATMENT OF STAPH INFECTIONS. Performed at [facility]    REPTSTATUS 09/24/2014 FINAL 09/21/2014 2133   Dg Foot Complete Right  Result Date: 06/20/2016 CLINICAL DATA:  Subacute onset of ulceration at the right great toe. Initial encounter. EXAM: RIGHT FOOT COMPLETE - 3+ VIEW COMPARISON:  None. FINDINGS: There is no evidence of fracture or dislocation. There is no evidence of osseous erosion. The joint spaces are preserved. There is no evidence of talar subluxation; the subtalar joint is unremarkable in appearance. Scattered vascular calcifications are seen. The known soft tissue ulceration at the right great toe is not well characterized on radiograph. No radiopaque foreign bodies are seen. No significant soft tissue air is noted. Incidental note is made of a bipartite medial sesamoid of the great toe. An os peroneum is noted. IMPRESSION: 1. No evidence of fracture or dislocation. No evidence of osseous erosion. 2. Known ulceration not well characterized. No radiopaque foreign bodies seen. No significant soft tissue air seen. 3. Scattered vascular calcifications noted. 4. Bipartite medial sesamoid of the great toe. 5. Os peroneum noted. Electronically Signed   By: Roanna Raider M.D.   On: 06/20/2016 04:45   Recent Results (from the past 240 hour(s))  MRSA PCR Screening     Status: None   Collection Time: 06/20/16  6:31 AM  Result Value Ref Range Status   MRSA by PCR NEGATIVE NEGATIVE Final    Comment:        The GeneXpert MRSA Assay (FDA approved for NASAL specimens only), is one component of a comprehensive  MRSA colonization surveillance program. It is not intended to diagnose MRSA infection nor to guide or monitor treatment for MRSA infections.       06/21/2016, 2:53 PM     LOS: 1 day    Records and images were personally reviewed where available.

## 2016-06-21 NOTE — Progress Notes (Signed)
Pharmacy Antibiotic Note  [name] is a 35 y.o. female admitted on 06/20/2016 with R foot infection.  Pharmacy has been consulted for Vancomycin dosing and now changing ceftriaxone to ceftazidime.   Plan: Ceftaz 2gm post-HD  F/u renal plans, C&S, clinical status and ID recs  Height: 5\' 8"  (172.7 cm) Weight: 263 lb (119.3 kg) IBW/kg (Calculated) : 63.9  Temp (24hrs), Avg:97.6 F (36.4 C), Min:97.3 F (36.3 C), Max:97.9 F (36.6 C)   Recent Labs Lab 06/20/16 0147 06/20/16 0205  WBC 5.2  --   CREATININE  --  3.70*    Estimated Creatinine Clearance: 28.6 mL/min (by C-G formula based on SCr of 3.7 mg/dL (H)).    Allergies  Allergen Reactions  . Aspirin Anaphylaxis  . Sulfur Hives  . Tramadol Other (See Comments)    Pt states she feels weird     Gianny Sabino, Drake Leach 06/21/2016 3:33 PM

## 2016-06-21 NOTE — Progress Notes (Signed)
VASCULAR LAB PRELIMINARY  ARTERIAL  ABI completed: Bilateral ABI non-compressible. Waveforms appear abnormal on the right.    RIGHT    LEFT    PRESSURE WAVEFORM  PRESSURE WAVEFORM  BRACHIAL 157 Tri BRACHIAL N/A -AVF    DP   DP    AT 255 [place] AT 255 Tri  PT 255 Bi PT 255 Tri  PER   PER    GREAT TOE  NA GREAT TOE  NA    RIGHT LEFT  ABI  [place]    Farrel Demark, RDMS, RVT  06/21/2016, 11:12 AM

## 2016-06-21 NOTE — Progress Notes (Signed)
Returned from hemodialysis.

## 2016-06-21 NOTE — Consult Note (Signed)
[facility] Renal Consultation Note    Indication for Consultation:  Management of ESRD/hemodialysis; anemia, hypertension/volume and secondary hyperparathyroidism PCP: [name], FNP  HPI: [name] is a 35 y.o. female with ESRD on TTS HD at [facility] since mid June 2017, DM, morbid obesity, who presented to the ED with worsening right great toe ulcer and infection.  She has taken keflex and clinida in the past without improvement. Xray showed no evidence of fx, erosion. WBC is not elevated. She is afebrile.  She had ABIs this am there were bilaterally non-compressible with abnormal waveforms on the right.  At present she is very drowsy - barely staying awake during conversation- she says due to sleeping poorly last night. She denies SOB, fever, chills, diarrhea.  She states the scars on her legs are due to scratching when her phosphorus was high. She has indigestion at times and takes prn tums. Her right great toe remains painful.  She has no real issues with her HD treatments except occasional BP drops.  Past Medical History:  Diagnosis Date  . Anemia   . Arthritis    "left hand" (09/15/2013)  . Asthma   . Chronic bronchitis (HCC)    "just about q yr" (09/15/2013)  . Chronic kidney disease    "low kidney function" (09/15/2013)  . COPD (chronic obstructive pulmonary disease) (HCC)   . Coronary artery disease   . Hyperlipidemia   . Hypertension   . Migraine    "get them alot" (09/15/2013)  . Myocardial infarction [facility]) 04/2015   NSTEMI  . Normal coronary arteries    by cardiac catheterization 09/20/13  . Pneumonia    "couple times; have it now" (09/15/2013)  . Shortness of breath    "just recently; related to the pneumonia" (09/15/2013)  . Type 1 diabetes mellitus (HCC)    type 2   Past Surgical History:  Procedure Laterality Date  . AV FISTULA PLACEMENT Left 03/27/2015   Procedure: CREATION RADIAL CEPHALIC ARTERIOVENOUS FISTULA;  Surgeon: Chuck Hint,  MD;  Location: [facility] OR;  Service: Vascular;  Laterality: Left;  . AV FISTULA PLACEMENT Left 11/23/2015   Procedure:  LEFT ARM BASILIC VEIN TRANSPOSITION  ;  Surgeon: Chuck Hint, MD;  Location: [facility] OR;  Service: Vascular;  Laterality: Left;  . CESAREAN SECTION  1999; 2006  . CORONARY ANGIOGRAM  09/20/2013   Procedure: CORONARY ANGIOGRAM;  Surgeon: Runell Gess, MD;  Location: [facility] CATH LAB;  Service: Cardiovascular;;  . FINGER SURGERY Left 1985   3rd and 4th digits reconstructed after cut off" (09/15/2013)  . SHOULDER ARTHROSCOPY WITH BICEPSTENOTOMY Right 05/10/2015   Procedure: RIGHT SHOULDER ARTHROSCOPY WITH BICEPS TENOTOMY, DEBRIDEMENT LABRAL TEAR;  Surgeon: Jones Broom, MD;  Location: [facility] OR;  Service: Orthopedics;  Laterality: Right;  Right shoulder arthroscopy biceps tenotomy, debridement labral tear  . TONSILLECTOMY  1997  . TUBAL LIGATION  2006   Family History  Problem Relation Age of Onset  . Diabetes Mother   . Stroke Mother   . Hypertension Father   . Hyperlipidemia Father   . Cancer - Lung Father   . Diabetes Maternal Grandmother   . Cancer Paternal Grandmother    Social History:  reports that she has never smoked. She has never used smokeless tobacco. She reports that she does not drink alcohol or use drugs. Allergies  Allergen Reactions  . Aspirin Anaphylaxis  . Sulfur Hives  . Tramadol Other (See Comments)    Pt states she feels weird  Prior to Admission medications   Medication Sig Start Date End Date Taking? Authorizing Provider  albuterol (PROVENTIL HFA;VENTOLIN HFA) 108 (90 BASE) MCG/ACT inhaler Inhale 2 puffs into the lungs every 6 (six) hours as needed for wheezing or shortness of breath.    Yes Historical Provider, MD  albuterol (PROVENTIL) (2.5 MG/3ML) 0.083% nebulizer solution Take 2.5 mg by nebulization every 6 (six) hours as needed for wheezing or shortness of breath.   Yes Historical Provider, MD  atorvastatin (LIPITOR) 40 MG tablet Take 40  mg by mouth at bedtime.   Yes Historical Provider, MD  budesonide (PULMICORT) 0.25 MG/2ML nebulizer solution Take 0.25 mg by nebulization 2 (two) times daily as needed (shortness of breath or wheezing).   Yes Historical Provider, MD  calcitRIOL (ROCALTROL) 0.25 MCG capsule Take 0.25 mcg by mouth daily.   Yes Historical Provider, MD  carvedilol (COREG) 25 MG tablet Take 25 mg by mouth 2 (two) times daily.   Yes Historical Provider, MD  hydrOXYzine (ATARAX/VISTARIL) 25 MG tablet Take 1 tablet (25 mg total) by mouth every 6 (six) hours. 03/30/16  Yes Rolland Porter, MD  insulin aspart (NOVOLOG FLEXPEN) 100 UNIT/ML FlexPen Inject 12-20 Units into the skin 3 (three) times daily with meals. Per sliding scale - based on carb count and CBG   Yes Historical Provider, MD  Insulin Glargine (LANTUS SOLOSTAR) 100 UNIT/ML Solostar Pen Inject 20 Units into the skin at bedtime.   Yes Historical Provider, MD  isosorbide dinitrate (ISORDIL) 20 MG tablet Take 20 mg by mouth 3 (three) times daily.   Yes Historical Provider, MD  sevelamer (RENAGEL) 800 MG tablet Take 800 mg by mouth daily.   Yes Historical Provider, MD   Current Facility-Administered Medications  Medication Dose Route Frequency Provider Last Rate Last Dose  . albuterol (PROVENTIL) (2.5 MG/3ML) 0.083% nebulizer solution 2.5 mg  2.5 mg Nebulization Q6H PRN [name], DO      . atorvastatin (LIPITOR) tablet 40 mg  40 mg Oral QHS [name], DO   40 mg at 06/20/16 2229  . budesonide (PULMICORT) nebulizer solution 0.25 mg  0.25 mg Nebulization BID PRN [name], DO      . calcitRIOL (ROCALTROL) capsule 1 mcg  1 mcg Oral Q T,Th,Sa-HD Weston Settle, PA-C      . carvedilol (COREG) tablet 25 mg  25 mg Oral BID WC [name], DO   25 mg at 06/21/16 0946  . cefTRIAXone (ROCEPHIN) 2 g in dextrose 5 % 50 mL IVPB  2 g Intravenous Q24H Hannah Muthersbaugh, PA-C   2 g at 06/21/16 0955   And  . metroNIDAZOLE (FLAGYL) IVPB 500 mg  500 mg Intravenous  Q8H Hannah Muthersbaugh, PA-C   500 mg at 06/21/16 0526  . feeding supplement (NEPRO CARB STEADY) liquid 237 mL  237 mL Oral QPC lunch Marinell Blight, RD   237 mL at 06/20/16 1512  . heparin injection 5,000 Units  5,000 Units Subcutaneous Q8H [name], DO   5,000 Units at 06/20/16 2207  . hydrOXYzine (ATARAX/VISTARIL) tablet 25 mg  25 mg Oral Q6H [name], DO   25 mg at 06/21/16 0537  . insulin aspart (novoLOG) injection 0-9 Units  0-9 Units Subcutaneous TID WC [name], DO   1 Units at 06/20/16 1700  . insulin aspart (novoLOG) injection 4 Units  4 Units Subcutaneous TID WC [name], DO   4 Units at 06/20/16 1701  . insulin  glargine (LANTUS) injection 20 Units  20 Units Subcutaneous QHS Hillary BowJared M Gardner, DO   20 Units at 06/20/16 2211  . isosorbide dinitrate (ISORDIL) tablet 20 mg  20 mg Oral TID Hillary BowJared M Gardner, DO   20 mg at 06/21/16 0947  . mupirocin cream (BACTROBAN) 2 %   Topical Daily Lenox PondsEdwin Silva Zapata, MD      . oxyCODONE (Oxy IR/ROXICODONE) immediate release tablet 5 mg  5 mg Oral Q6H PRN Lenox PondsEdwin Silva Zapata, MD      . sevelamer carbonate (RENVELA) tablet 800 mg  800 mg Oral TID WC Hillary BowJared M Gardner, DO   800 mg at 06/21/16 0946  . vancomycin (VANCOCIN) IVPB 1000 mg/200 mL premix  1,000 mg Intravenous Q T,Th,Sa-HD Tomasita CrumbleAdeleke Oni, MD       Labs: Basic Metabolic Panel:  Recent Labs Lab 06/20/16 0205  NA 135  K 3.8  CL 92*  GLUCOSE 193*  BUN 26*  CREATININE 3.70*   Liver Function Tests: No results for input(s): AST, ALT, ALKPHOS, BILITOT, PROT, ALBUMIN in the last 168 hours. No results for input(s): LIPASE, AMYLASE in the last 168 hours. No results for input(s): AMMONIA in the last 168 hours. CBC:  Recent Labs Lab 06/20/16 0147 06/20/16 0205  WBC 5.2  --   NEUTROABS 3.5  --   HGB 10.2* 12.9  HCT 33.9* 38.0  MCV 104.6*  --   PLT 213  --    Cardiac Enzymes: No results for input(s): CKTOTAL, CKMB, CKMBINDEX, TROPONINI in the last 168  hours. CBG:  Recent Labs Lab 06/20/16 0725 06/20/16 1129 06/20/16 1636 06/20/16 2134 06/21/16 0558  GLUCAP 146* 84 124* 124* 104*   Iron Studies: No results for input(s): IRON, TIBC, TRANSFERRIN, FERRITIN in the last 72 hours. Studies/Results: Dg Foot Complete Right  Result Date: 06/20/2016 CLINICAL DATA:  Subacute onset of ulceration at the right great toe. Initial encounter. EXAM: RIGHT FOOT COMPLETE - 3+ VIEW COMPARISON:  None. FINDINGS: There is no evidence of fracture or dislocation. There is no evidence of osseous erosion. The joint spaces are preserved. There is no evidence of talar subluxation; the subtalar joint is unremarkable in appearance. Scattered vascular calcifications are seen. The known soft tissue ulceration at the right great toe is not well characterized on radiograph. No radiopaque foreign bodies are seen. No significant soft tissue air is noted. Incidental note is made of a bipartite medial sesamoid of the great toe. An os peroneum is noted. IMPRESSION: 1. No evidence of fracture or dislocation. No evidence of osseous erosion. 2. Known ulceration not well characterized. No radiopaque foreign bodies seen. No significant soft tissue air seen. 3. Scattered vascular calcifications noted. 4. Bipartite medial sesamoid of the great toe. 5. Os peroneum noted. Electronically Signed   By: Roanna RaiderJeffery  Chang M.D.   On: 06/20/2016 04:45    ROS: As per HPI otherwise negative.  Physical Exam: Vitals:   06/20/16 0542 06/20/16 1300 06/20/16 2012 06/21/16 0519  BP: (!) 149/90 125/80 (!) 150/90 124/74  Pulse: 88 85 88 89  Resp:  18 18 18   Temp: 97.7 F (36.5 C) 97.6 F (36.4 C) 97.9 F (36.6 C) 97.6 F (36.4 C)  TempSrc: Axillary Oral Oral Oral  SpO2:  96% 99% 92%  Weight:      Height:         General: WDWN chronically ill appearing AAF NAD Head: NCAT sclera not icteric MMM Neck: Supple.  Lungs: CTA bilaterally without wheezes, rales, or rhonchi. Breathing is  unlabored. Heart: RRR with S1 S2.  Abdomen: soft  With very large pannus, NT + BS Lower extremities: obese, trace + edemaLE edema right great toe - erythematous tender,small scabbed wound; multiple scars on legs Neuro: A & O  X 3. Moves all extremities spontaneously. Psych:  Responds to questions appropriately with a normal affect. Dialysis Access: left upper AVF + bruit  Dialysis Orders: TTS [facility] 4.25 hours 200 Optiflux 450/800 EDW 123.5 2 K 2 Ca heparin 12K left upper AVF calcitriol 1 venofer 50 q thur and Aranesp 200 q Thursday - both last given 9/21 Recent labs: hgb 9.6 iPTH 90- calcitriol just lowered Ca/P ok hgb A1c 5.5 July  Assessment/Plan: 1. Right great toe cellulitis/ulcer - seems localized = empiric antibiotics - probably needs LE arteriogram 2. ESRD -  TTS -for HD today istat K - check labs pre HD 3. Hypertension/volume  - gets to EDW/gains variable 4. Anemia  - hgb per CBC 10.2 - stable on weekly ESA and Fe 5. Metabolic bone disease -  Check labs with HD - on VDRA 6. Nutrition - renal carb mod diet- ok with lowish K/fluid resstriction 7. DM - per primary  Sheffield Slider, PA-C [facility] Beeper [phone] 06/21/2016, 10:52 AM   Pt seen, examined and agree w A/P as above.  Vinson Moselle MD [employer] pager [phone]    cell [phone] 06/21/2016, 1:42 PM

## 2016-06-22 ENCOUNTER — Inpatient Hospital Stay (HOSPITAL_COMMUNITY): Payer: Medicaid Other

## 2016-06-22 DIAGNOSIS — M86171 Other acute osteomyelitis, right ankle and foot: Secondary | ICD-10-CM

## 2016-06-22 LAB — GLUCOSE, CAPILLARY
GLUCOSE-CAPILLARY: 121 mg/dL — AB (ref 65–99)
GLUCOSE-CAPILLARY: 97 mg/dL (ref 65–99)
GLUCOSE-CAPILLARY: 62 mg/dL — AB (ref 65–99)
Glucose-Capillary: 133 mg/dL — ABNORMAL HIGH (ref 65–99)
Glucose-Capillary: 109 mg/dL — ABNORMAL HIGH (ref 65–99)

## 2016-06-22 LAB — HEPATITIS B SURFACE ANTIGEN: Hepatitis B Surface Ag: POSITIVE — AB

## 2016-06-22 NOTE — Progress Notes (Signed)
[facility] Progress Note   Dialysis Orders: TTS [facility] 4.25 hours 200 Optiflux 450/800 EDW 123.5 2 K 2 Ca heparin 12K left upper AVF calcitriol 1 venofer 50 q thur and Aranesp 200 q Thursday - both last given 9/21 Recent labs: hgb 9.6 iPTH 90- calcitriol just lowered Ca/P ok hgb A1c 5.5 July  Assessment/Plan: 1. Right great toe cellulitis/ulcer - seems localized  probably needs LE arteriogram; MRI pending - Vanc and Fortaz per ID 2. ESRD -  TTS K 4.2 Sat pre HD - no issues with treatment- next HD Tuesday - prob lower edw at d/c 3. Hypertension/volume  - gets to EDW/gains variable; net UF 4.6 Saturday with post weight 122.5. BP 130 - 140s during HD treatment. EDW can maybe come down a little more..  4. Anemia  - hgb per CBC 10.2 - stable on weekly ESA and Fe- hgb not drawn pre HD - 5. Metabolic bone disease -  Ca/P ok- on VDRA/ binders 6. Nutrition - alb 3.3 VERY unhappy with restricted diet.  Labs/good diabetes control indicate that she is able to make appropriate selections in food choices as an outpatient.  Have liberalized diet to regular to provide her more variety in selections 7. DM - per primary - BS ok hgb A1c 5.1  Sheffield Slider, PA-C [facility] Beeper [phone] 06/22/2016,8:13 AM  LOS: 2 days   Pt seen, examined and agree w A/P as above.  Vinson Moselle MD [facility] pager [phone]    cell [phone] 06/22/2016, 1:33 PM    Subjective:   Does not remember me visiting her yesterday. Some cannulation issues yesterday but a different RN was able to cannulate- no other problems during treatment.  VERY UNHAPPY about restrictive diet and expressed why she should be able to choose what she wants to eat. Toe continues to hurt.  Objective Vitals:   06/21/16 1830 06/21/16 1850 06/21/16 2021 06/22/16 0502  BP: (!) 136/109 (!) 146/91 (!) 155/90 133/80  Pulse: 92 91 96 86  Resp:  18 18 18   Temp:  98 F (36.7 C) 98.6 F (37 C) 98.7 F  (37.1 C)  TempSrc:  Oral Oral Oral  SpO2:  95% 100% 95%  Weight:  122.5 kg (270 lb 1 oz)    Height:       Physical Exam General: NAD just irritable Heart: RRR Lungs: no rales Abdomen: obese/large pannus, soft NT Extremities: maybe tr LE edema Dialysis Access:  Left AVF + bruit   Additional Objective Labs: Basic Metabolic Panel:  Recent Labs Lab 06/20/16 0205 06/21/16 1720  NA 135 138  K 3.8 4.2  CL 92* 99*  CO2  --  27  GLUCOSE 193* 114*  BUN 26* 25*  CREATININE 3.70* 3.69*  CALCIUM  --  9.2  PHOS  --  3.9   Liver Function Tests:  Recent Labs Lab 06/21/16 1720  ALBUMIN 3.3*  CBC:  Recent Labs Lab 06/20/16 0147 06/20/16 0205  WBC 5.2  --   NEUTROABS 3.5  --   HGB 10.2* 12.9  HCT 33.9* 38.0  MCV 104.6*  --   PLT 213  --    Blood Culture    Component Value Date/Time   SDES BLOOD RIGHT HAND 06/20/2016 0630   SPECREQUEST IN PEDIATRIC BOTTLE 3CC 06/20/2016 0630   CULT NO GROWTH 1 DAY 06/20/2016 0630   REPTSTATUS PENDING 06/20/2016 0630    CBG:  Recent Labs Lab 06/20/16 2134 06/21/16 0558 06/21/16 1135 06/21/16  2049 06/22/16 0608  GLUCAP 124* 104* 138* 191* 121*   Studies/Results: No results found. Medications:   . atorvastatin  40 mg Oral QHS  . calcitRIOL  1 mcg Oral Q T,Th,Sa-HD  . carvedilol  25 mg Oral BID WC  . cefTAZidime (FORTAZ)  IV  2 g Intravenous Q T,Th,Sat-1800  . feeding supplement (NEPRO CARB STEADY)  237 mL Oral QPC lunch  . heparin  5,000 Units Subcutaneous Q8H  . hydrOXYzine  25 mg Oral Q6H  . insulin aspart  0-9 Units Subcutaneous TID WC  . insulin aspart  4 Units Subcutaneous TID WC  . insulin glargine  20 Units Subcutaneous QHS  . isosorbide dinitrate  20 mg Oral TID  . mupirocin cream   Topical Daily  . sevelamer carbonate  3,200 mg Oral TID WC  . vancomycin  1,000 mg Intravenous Q T,Th,Sa-HD

## 2016-06-22 NOTE — Progress Notes (Signed)
PROGRESS NOTE [facility]   [name]   ZOX:[id number] DOB: 12/28/1980  DOA: 06/20/2016 PCP: Elizabeth PalauANDERSON,TERESA, FNP   Brief Narrative:   [age] mL-year-old female with past medical history of diabetes type 1, ESRD on HD TTS,. Presented to the ED with progressively worsening of R great toe ulcer an infection, which failed abx therapy x 2 and had a procedure done by podiatry.  Admitted for infected wound and cellulitis of the right great toe. Placed on IV abx, ID consulted, MRI ordered suspicious for osteo. Orthopedic consulted. Patient had an ABI that showed no compressible vessels.   Subjective:  Patient seen and examined today with relatives at bedside. Continues to c/o toe pain, although report some improvement in swelling and erythema. For HD today. No acute events overnight.    Assessment & Plan:   Diabetic foot infection R great toe infected/cellulitis w/ osteomyelitis  - Minimal improvement No WBC or fever - MRI show suspicious for osteo ABI showed no compressible vessels - pulses on bilateral feet heart feel but palpable, good capillary refill and warm to touch. Patient would benefit to see vascular surgery as an outpatient.  - Continue Abx per ID  - ID recommendations appreciated - Ortho consulted - Called  - ABI - Bilateral ABI non-compressible - F/u blood cultures no growth UTD  DM type 1 -  CBG's during hospital stay well controlled - Continue current management  - Monitor CBG's   Morbid Obesity - Weight loss discussed  - Dietary consult  Hypertension - Stable  - Continue home medication s  ESRD (end stage renal disease) on dialysis [facility](HCC) - Renal consulted for routine dialysis. - for HD today     DVT prophylaxis: Heparin sq Code Status: Full  Family Communication: family at bedside, plan discussed and explained in details  Disposition Plan: Anticipated discharge home when medically impropved  Consultants:   ID   Procedures:   ABI - Bilateral ABI  non-compressible  Antimicrobials:  Rocephin 06/20/16 -06/21/16  Flagyl 06/20/16 - 06/21/16  Vanco 06/20/16  Ceftaz 06/21/16   Objective: Vitals:   06/21/16 1830 06/21/16 1850 06/21/16 2021 06/22/16 0502  BP: (!) 136/109 (!) 146/91 (!) 155/90 133/80  Pulse: 92 91 96 86  Resp:  18 18 18   Temp:  98 F (36.7 C) 98.6 F (37 C) 98.7 F (37.1 C)  TempSrc:  Oral Oral Oral  SpO2:  95% 100% 95%  Weight:  122.5 kg (270 lb 1 oz)    Height:        Intake/Output Summary (Last 24 hours) at 06/22/16 1326 Last data filed at 06/21/16 2053  Gross per 24 hour  Intake              410 ml  Output             4600 ml  Net            -4190 ml   Filed Weights   06/19/16 2059 06/21/16 1430 06/21/16 1850  Weight: 119.3 kg (263 lb) 126.6 kg (279 lb 1.6 oz) 122.5 kg (270 lb 1 oz)    Examination:  General exam: Appears calm and comfortable  Respiratory system: Clear to auscultation. No wheezes,crackle or rhonchi Cardiovascular system: S1 & S2 heard, RRR. No JVD, murmurs, rubs or gallops, pulses hard to feel. Toes cap refill < 2 seconds, foot warm to touch Gastrointestinal system: Abdomen is nondistended, soft and nontender. Central nervous system: Alert and oriented. No focal neurological deficits. Extremities: No pedal  edema.Right great toe with hematoma around the nail, small wound at the medial site with no with no pus noted. Erythematous and mild swelling No changes Skin: Multiple scabs in all 4 extremities  Psychiatry: Judgement and insight appear normal. Mood & affect appropriate.    Data Reviewed: I have personally reviewed following labs and imaging studies  CBC:  Recent Labs Lab 06/20/16 0147 06/20/16 0205  WBC 5.2  --   NEUTROABS 3.5  --   HGB 10.2* 12.9  HCT 33.9* 38.0  MCV 104.6*  --   PLT 213  --    Basic Metabolic Panel:  Recent Labs Lab 06/20/16 0205 06/21/16 1720  NA 135 138  K 3.8 4.2  CL 92* 99*  CO2  --  27  GLUCOSE 193* 114*  BUN 26* 25*  CREATININE  3.70* 3.69*  CALCIUM  --  9.2  PHOS  --  3.9   GFR: Estimated Creatinine Clearance: 29 mL/min (by C-G formula based on SCr of 3.69 mg/dL (H)). Liver Function Tests:  Recent Labs Lab 06/21/16 1720  ALBUMIN 3.3*   No results for input(s): LIPASE, AMYLASE in the last 168 hours. No results for input(s): AMMONIA in the last 168 hours. Coagulation Profile: No results for input(s): INR, PROTIME in the last 168 hours. Cardiac Enzymes: No results for input(s): CKTOTAL, CKMB, CKMBINDEX, TROPONINI in the last 168 hours. BNP (last 3 results) No results for input(s): PROBNP in the last 8760 hours. HbA1C:  Recent Labs  06/20/16 0627  HGBA1C 5.1   CBG:  Recent Labs Lab 06/21/16 0558 06/21/16 1135 06/21/16 2049 06/22/16 0608 06/22/16 1149  GLUCAP 104* 138* 191* 121* 133*   Lipid Profile: No results for input(s): CHOL, HDL, LDLCALC, TRIG, CHOLHDL, LDLDIRECT in the last 72 hours. Thyroid Function Tests: No results for input(s): TSH, T4TOTAL, FREET4, T3FREE, THYROIDAB in the last 72 hours. Anemia Panel: No results for input(s): VITAMINB12, FOLATE, FERRITIN, TIBC, IRON, RETICCTPCT in the last 72 hours. Sepsis Labs: No results for input(s): PROCALCITON, LATICACIDVEN in the last 168 hours.  Recent Results (from the past 240 hour(s))  Blood Cultures x 2 sites     Status: None (Preliminary result)   Collection Time: 06/20/16  4:25 AM  Result Value Ref Range Status   Specimen Description BLOOD RIGHT FOREARM  Final   Special Requests BOTTLES DRAWN AEROBIC AND ANAEROBIC  Final   Culture NO GROWTH 2 DAYS  Final   Report Status PENDING  Incomplete  Blood Cultures x 2 sites     Status: None (Preliminary result)   Collection Time: 06/20/16  6:30 AM  Result Value Ref Range Status   Specimen Description BLOOD RIGHT HAND  Final   Special Requests IN PEDIATRIC BOTTLE 3CC  Final   Culture NO GROWTH 2 DAYS  Final   Report Status PENDING  Incomplete  MRSA PCR Screening     Status: None     Collection Time: 06/20/16  6:31 AM  Result Value Ref Range Status   MRSA by PCR NEGATIVE NEGATIVE Final    Comment:        The GeneXpert MRSA Assay (FDA approved for NASAL specimens only), is one component of a comprehensive MRSA colonization surveillance program. It is not intended to diagnose MRSA infection nor to guide or monitor treatment for MRSA infections.       Radiology Studies: Mr Toes Right W/o Cm  Result Date: 06/22/2016 CLINICAL DATA:  Diabetic patient with an ulceration on the right great toe. Question  osteomyelitis or abscess. EXAM: MRI OF THE RIGHT TOES WITHOUT CONTRAST TECHNIQUE: Multiplanar, multisequence MR imaging was performed. No intravenous contrast was administered. COMPARISON:  Plain films the right foot 06/20/2016. FINDINGS: The skin surface appears irregular along the medial margin of the right great toe at the level of the distal phalanx. Mild marrow edema is seen in the tuft of the distal phalanx. Bone marrow signal is otherwise unremarkable. No joint effusion is identified. No abscess is seen. Mild subcutaneous edema over the dorsum of the foot is noted. IMPRESSION: Ulceration on the distal great toe. Mild marrow edema in the tuft of the distal phalanx is worrisome for osteomyelitis. Negative for abscess or septic joint. Electronically Signed   By: Drusilla Kanner M.D.   On: 06/22/2016 12:38     Scheduled Meds: . atorvastatin  40 mg Oral QHS  . calcitRIOL  1 mcg Oral Q T,Th,Sa-HD  . carvedilol  25 mg Oral BID WC  . cefTAZidime (FORTAZ)  IV  2 g Intravenous Q T,Th,Sat-1800  . feeding supplement (NEPRO CARB STEADY)  237 mL Oral QPC lunch  . heparin  5,000 Units Subcutaneous Q8H  . hydrOXYzine  25 mg Oral Q6H  . insulin aspart  0-9 Units Subcutaneous TID WC  . insulin aspart  4 Units Subcutaneous TID WC  . insulin glargine  20 Units Subcutaneous QHS  . isosorbide dinitrate  20 mg Oral TID  . mupirocin cream   Topical Daily  . sevelamer carbonate   3,200 mg Oral TID WC  . vancomycin  1,000 mg Intravenous Q T,Th,Sa-HD   Continuous Infusions:    LOS: 2 days    Latrelle Dodrill, MD [facility] Pager [phone]  If 7PM-7AM, please contact night-coverage [email or web address] Password [email or web address] 06/22/2016, 1:26 PM

## 2016-06-22 NOTE — Progress Notes (Signed)
INFECTIOUS DISEASE PROGRESS NOTE  ID: [name] is a 35 y.o. 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)  Subjective: Without complaints  Abtx:  Anti-infectives    Start     Dose/Rate Route Frequency Ordered Stop   06/21/16 1800  cefTAZidime (FORTAZ) 2 g in dextrose 5 % 50 mL IVPB     2 g 100 mL/hr over 30 Minutes Intravenous Every T-Th-Sa (1800) 06/21/16 1533     06/21/16 1200  vancomycin (VANCOCIN) IVPB 1000 mg/200 mL premix     1,000 mg 200 mL/hr over 60 Minutes Intravenous Every T-Th-Sa (Hemodialysis) 06/20/16 0507     06/20/16 0515  vancomycin (VANCOCIN) 2,000 mg in sodium chloride 0.9 % 500 mL IVPB     2,000 mg 250 mL/hr over 120 Minutes Intravenous  Once 06/20/16 0507 06/20/16 0800   06/20/16 0330  cefTRIAXone (ROCEPHIN) 2 g in dextrose 5 % 50 mL IVPB  Status:  Discontinued     2 g 100 mL/hr over 30 Minutes Intravenous Every 24 hours 06/20/16 0320 06/21/16 1530   06/20/16 0330  metroNIDAZOLE (FLAGYL) IVPB 500 mg  Status:  Discontinued     500 mg 100 mL/hr over 60 Minutes Intravenous Every 8 hours 06/20/16 0320 06/21/16 1530      Medications:  Scheduled: . atorvastatin  40 mg Oral QHS  . calcitRIOL  1 mcg Oral Q T,Th,Sa-HD  . carvedilol  25 mg Oral BID WC  . cefTAZidime (FORTAZ)  IV  2 g Intravenous Q T,Th,Sat-1800  . feeding supplement (NEPRO CARB STEADY)  237 mL Oral QPC lunch  . heparin  5,000 Units Subcutaneous Q8H  . hydrOXYzine  25 mg Oral Q6H  . insulin aspart  0-9 Units Subcutaneous TID WC  . insulin aspart  4 Units Subcutaneous TID WC  . insulin glargine  20 Units Subcutaneous QHS  . isosorbide dinitrate  20 mg Oral TID  . mupirocin cream    Topical Daily  . sevelamer carbonate  3,200 mg Oral TID WC  . vancomycin  1,000 mg Intravenous Q T,Th,Sa-HD    Objective: Vital signs in last 24 hours: Temp:  [98 F (36.7 C)-98.7 F (37.1 C)] 98.7 F (37.1 C) (09/24 1401) Pulse Rate:  [82-96] 82 (09/24 1401) Resp:  [18] 18 (09/24 0502) BP: (128-155)/(77-109) 139/82 (09/24 1401) SpO2:  [95 %-100 %] 98 % (09/24 1401) Weight:  [122.5 kg (270 lb 1 oz)] 122.5 kg (270 lb 1 oz) (09/23 1850)   General appearance: alert, cooperative and no distress Incision/Wound: decreased erythema, decreased tenderness.   Lab Results  Recent Labs  06/20/16 0147 06/20/16 0205 06/21/16 1720  WBC 5.2  --   --   HGB 10.2* 12.9  --   HCT 33.9* 38.0  --   NA  --  135 138  K  --  3.8 4.2  CL  --  92* 99*  CO2  --   --  27  BUN  --  26* 25*  CREATININE  --  3.70* 3.69*   Liver Panel  Recent Labs  06/21/16 1720  ALBUMIN 3.3*   Sedimentation Rate  Recent Labs  06/20/16 0147  ESRSEDRATE  28*   C-Reactive Protein  Recent Labs  06/20/16 0147  CRP 0.8    Microbiology: Recent Results (from the past 240 hour(s))  Blood Cultures x 2 sites     Status: None (Preliminary result)   Collection Time: 06/20/16  4:25 AM  Result Value Ref Range Status   Specimen Description BLOOD RIGHT FOREARM  Final   Special Requests BOTTLES DRAWN AEROBIC AND ANAEROBIC  Final   Culture NO GROWTH 2 DAYS  Final   Report Status PENDING  Incomplete  Blood Cultures x 2 sites     Status: None (Preliminary result)   Collection Time: 06/20/16  6:30 AM  Result Value Ref Range Status   Specimen Description BLOOD RIGHT HAND  Final   Special Requests IN PEDIATRIC BOTTLE 3CC  Final   Culture NO GROWTH 2 DAYS  Final   Report Status PENDING  Incomplete  MRSA PCR Screening     Status: None   Collection Time: 06/20/16  6:31 AM  Result Value Ref Range Status   MRSA by PCR NEGATIVE NEGATIVE Final    Comment:        The GeneXpert MRSA Assay (FDA approved for NASAL  specimens only), is one component of a comprehensive MRSA colonization surveillance program. It is not intended to diagnose MRSA infection nor to guide or monitor treatment for MRSA infections.     Studies/Results: Mr Toes Right W/o Cm  Result Date: 06/22/2016 CLINICAL DATA:  Diabetic patient with an ulceration on the right great toe. Question osteomyelitis or abscess. EXAM: MRI OF THE RIGHT TOES WITHOUT CONTRAST TECHNIQUE: Multiplanar, multisequence MR imaging was performed. No intravenous contrast was administered. COMPARISON:  Plain films the right foot 06/20/2016. FINDINGS: The skin surface appears irregular along the medial margin of the right great toe at the level of the distal phalanx. Mild marrow edema is seen in the tuft of the distal phalanx. Bone marrow signal is otherwise unremarkable. No joint effusion is identified. No abscess is seen. Mild subcutaneous edema over the dorsum of the foot is noted. IMPRESSION: Ulceration on the distal great toe. Mild marrow edema in the tuft of the distal phalanx is worrisome for osteomyelitis. Negative for abscess or septic joint. Electronically Signed   By: Drusilla Kanner M.D.   On: 06/22/2016 12:38     Assessment/Plan: Diabetic Foot Infection/osteo DM1 ESRD  Total days of antibiotics: 2 vanco/ceftaz  Vascular and ortho evals in AM Would aim for 6 weeks of anbx with HD unless she has amputation (would then change rec to 2 weeks of anbx post amputation).          Johny Sax Infectious Diseases (pager) [phone] [email or web address] 06/22/2016, 3:35 PM  LOS: 2 days

## 2016-06-23 ENCOUNTER — Other Ambulatory Visit (HOSPITAL_COMMUNITY): Payer: Self-pay | Admitting: Family

## 2016-06-23 DIAGNOSIS — I96 Gangrene, not elsewhere classified: Secondary | ICD-10-CM

## 2016-06-23 DIAGNOSIS — M869 Osteomyelitis, unspecified: Secondary | ICD-10-CM

## 2016-06-23 DIAGNOSIS — E1169 Type 2 diabetes mellitus with other specified complication: Secondary | ICD-10-CM

## 2016-06-23 LAB — BASIC METABOLIC PANEL
Anion gap: 12 (ref 5–15)
BUN: 31 mg/dL — AB (ref 6–20)
CO2: 28 mmol/L (ref 22–32)
Calcium: 9.6 mg/dL (ref 8.9–10.3)
Chloride: 96 mmol/L — ABNORMAL LOW (ref 101–111)
Creatinine, Ser: 5.21 mg/dL — ABNORMAL HIGH (ref 0.44–1.00)
GFR, EST AFRICAN AMERICAN: 11 mL/min — AB (ref >60)
GFR, EST NON AFRICAN AMERICAN: 10 mL/min — AB (ref >60)
Glucose, Bld: 117 mg/dL — ABNORMAL HIGH (ref 65–99)
POTASSIUM: 4.4 mmol/L (ref 3.5–5.1)
SODIUM: 136 mmol/L (ref 135–145)

## 2016-06-23 LAB — SEDIMENTATION RATE: SED RATE: 28 mm/h — AB (ref 0–22)

## 2016-06-23 LAB — GLUCOSE, CAPILLARY
GLUCOSE-CAPILLARY: 130 mg/dL — AB (ref 65–99)
GLUCOSE-CAPILLARY: 146 mg/dL — AB (ref 65–99)
GLUCOSE-CAPILLARY: 136 mg/dL — AB (ref 65–99)
Glucose-Capillary: 110 mg/dL — ABNORMAL HIGH (ref 65–99)

## 2016-06-23 LAB — C-REACTIVE PROTEIN: CRP: 0.6 mg/dL (ref <1.0)

## 2016-06-23 LAB — CBC WITH DIFFERENTIAL/PLATELET
BASOS ABS: 0.1 10*3/uL (ref 0.0–0.1)
BASOS PCT: 1 %
EOS ABS: 0.4 10*3/uL (ref 0.0–0.7)
EOS PCT: 8 %
HCT: 32.4 % — ABNORMAL LOW (ref 36.0–46.0)
Hemoglobin: 9.6 g/dL — ABNORMAL LOW (ref 12.0–15.0)
LYMPHS ABS: 1.3 10*3/uL (ref 0.7–4.0)
Lymphocytes Relative: 23 %
MCH: 31.2 pg (ref 26.0–34.0)
MCHC: 29.6 g/dL — ABNORMAL LOW (ref 30.0–36.0)
MCV: 105.2 fL — ABNORMAL HIGH (ref 78.0–100.0)
Monocytes Absolute: 0.4 10*3/uL (ref 0.1–1.0)
Monocytes Relative: 7 %
Neutro Abs: 3.3 10*3/uL (ref 1.7–7.7)
Neutrophils Relative %: 61 %
PLATELETS: 201 10*3/uL (ref 150–400)
RBC: 3.08 MIL/uL — AB (ref 3.87–5.11)
RDW: 18.2 % — ABNORMAL HIGH (ref 11.5–15.5)
WBC: 5.3 10*3/uL (ref 4.0–10.5)

## 2016-06-23 MED ORDER — OXYCODONE HCL 5 MG PO TABS
5.0000 mg | ORAL_TABLET | ORAL | Status: DC | PRN
  Administered 2016-06-23 – 2016-06-25 (×9): 10 mg via ORAL
  Filled 2016-06-23 (×9): qty 2

## 2016-06-23 MED ORDER — INSULIN ASPART 100 UNIT/ML ~~LOC~~ SOLN
3.0000 [IU] | Freq: Three times a day (TID) | SUBCUTANEOUS | Status: DC
  Administered 2016-06-23: 3 [IU] via SUBCUTANEOUS

## 2016-06-23 MED ORDER — INSULIN GLARGINE 100 UNIT/ML ~~LOC~~ SOLN
16.0000 [IU] | Freq: Every day | SUBCUTANEOUS | Status: DC
  Administered 2016-06-23 – 2016-06-24 (×2): 16 [IU] via SUBCUTANEOUS
  Filled 2016-06-23 (×3): qty 0.16

## 2016-06-23 NOTE — Progress Notes (Signed)
[facility] Progress Note   Dialysis Orders: TTS [facility] 4.25 hours 200 Optiflux 450/800 EDW 123.5 2 K 2 Ca heparin 12K left upper AVF calcitriol 1 venofer 50 q thur and Aranesp 200 q Thursday - both last given 9/21 Recent labs: hgb 9.6 iPTH 90- calcitriol just lowered Ca/P ok hgb A1c 5.5 July  Assessment/Plan: 1. Right great toe Osteomyelitis - likely needs amputation, ortho (dr . [name] following) - Vanc and Elita Quick per ID 2. ESRD -  TTS K 4.2 Sat pre HD - no issues with treatment- next HD Tuesday - prob lower edw at d/c 3. Hypertension/volume  - gets to EDW/gains variable; net UF 4.6 Saturday with post weight 122.5. BP 130 - 140s during HD treatment. EDW can maybe come down a little more..  4. Anemia  - hgb per CBC 10.2 - stable on weekly ESA and Fe- hgb not drawn pre HD - 5. Metabolic bone disease -  Ca/P ok- on VDRA/ binders 6. Nutrition - alb 3.3 VERY unhappy with restricted diet.  Labs/good diabetes control indicate that she is able to make appropriate selections in food choices as an outpatient.  Have liberalized diet to regular to provide her more variety in selections 7. DM - per primary - BS ok hgb A1c 5.1   Vinson Moselle MD [facility] pager [phone]    cell [phone] 06/23/2016, 9:35 AM    Subjective:   Sleepy this AM .  R great toe pain.  Objective Vitals:   06/22/16 0502 06/22/16 1401 06/22/16 2105 06/23/16 0737  BP: 133/80 139/82 (!) 149/84 138/87  Pulse: 86 82 91 94  Resp: 18   16  Temp: 98.7 F (37.1 C) 98.7 F (37.1 C) 97.8 F (36.6 C) 97.6 F (36.4 C)  TempSrc: Oral Oral Oral Oral  SpO2: 95% 98% 99% 98%  Weight:      Height:       Physical Exam General: NAD sleepy but arousable Heart: RRR Lungs: no rales Abdomen: obese/large pannus, soft NT Extremities: maybe tr LE edema, R great toe with necrotic area at tip and some ulcerations. Dialysis Access:  Left AVF + bruit   Additional Objective Labs: Basic Metabolic  Panel:  Recent Labs Lab 06/20/16 0205 06/21/16 1720 06/23/16 0502  NA 135 138 136  K 3.8 4.2 4.4  CL 92* 99* 96*  CO2  --  27 28  GLUCOSE 193* 114* 117*  BUN 26* 25* 31*  CREATININE 3.70* 3.69* 5.21*  CALCIUM  --  9.2 9.6  PHOS  --  3.9  --    Liver Function Tests:  Recent Labs Lab 06/21/16 1720  ALBUMIN 3.3*  CBC:  Recent Labs Lab 06/20/16 0147 06/20/16 0205 06/23/16 0502  WBC 5.2  --  5.3  NEUTROABS 3.5  --  3.3  HGB 10.2* 12.9 9.6*  HCT 33.9* 38.0 32.4*  MCV 104.6*  --  105.2*  PLT 213  --  201   Blood Culture    Component Value Date/Time   SDES BLOOD RIGHT HAND 06/20/2016 0630   SPECREQUEST IN PEDIATRIC BOTTLE 3CC 06/20/2016 0630   CULT NO GROWTH 2 DAYS 06/20/2016 0630   REPTSTATUS PENDING 06/20/2016 0630    CBG:  Recent Labs Lab 06/22/16 1149 06/22/16 1639 06/22/16 2126 06/22/16 2208 06/23/16 0711  GLUCAP 133* 97 62* 109* 110*   Studies/Results: Mr Toes Right W/o Cm  Result Date: 06/22/2016 CLINICAL DATA:  Diabetic patient with an ulceration on the right great toe. Question osteomyelitis  or abscess. EXAM: MRI OF THE RIGHT TOES WITHOUT CONTRAST TECHNIQUE: Multiplanar, multisequence MR imaging was performed. No intravenous contrast was administered. COMPARISON:  Plain films the right foot 06/20/2016. FINDINGS: The skin surface appears irregular along the medial margin of the right great toe at the level of the distal phalanx. Mild marrow edema is seen in the tuft of the distal phalanx. Bone marrow signal is otherwise unremarkable. No joint effusion is identified. No abscess is seen. Mild subcutaneous edema over the dorsum of the foot is noted. IMPRESSION: Ulceration on the distal great toe. Mild marrow edema in the tuft of the distal phalanx is worrisome for osteomyelitis. Negative for abscess or septic joint. Electronically Signed   By: Drusilla Kannerhomas  Dalessio M.D.   On: 06/22/2016 12:38   Medications:   . atorvastatin  40 mg Oral QHS  . calcitRIOL  1 mcg  Oral Q T,Th,Sa-HD  . carvedilol  25 mg Oral BID WC  . cefTAZidime (FORTAZ)  IV  2 g Intravenous Q T,Th,Sat-1800  . feeding supplement (NEPRO CARB STEADY)  237 mL Oral QPC lunch  . heparin  5,000 Units Subcutaneous Q8H  . hydrOXYzine  25 mg Oral Q6H  . insulin aspart  0-9 Units Subcutaneous TID WC  . insulin aspart  4 Units Subcutaneous TID WC  . insulin glargine  20 Units Subcutaneous QHS  . isosorbide dinitrate  20 mg Oral TID  . mupirocin cream   Topical Daily  . sevelamer carbonate  3,200 mg Oral TID WC  . vancomycin  1,000 mg Intravenous Q T,Th,Sa-HD

## 2016-06-23 NOTE — Progress Notes (Signed)
PROGRESS NOTE [facility]   [name]   ZOX:[id number] DOB: June 26, 1981  DOA: 06/20/2016 PCP: [name], FNP   Brief Narrative:   32 mL-year-old female with past medical history of diabetes type 1, ESRD on HD TTS,. Presented to the ED with progressively worsening of R great toe ulcer an infection, which failed abx therapy x 2 and had a procedure done by podiatry.  Admitted for infected wound and cellulitis of the right great toe. Placed on IV abx, ID consulted, MRI ordered suspicious for osteo. Orthopedic consulted. Patient had an ABI that showed no compressible vessels. Necrosis of the tip of the toe, ortho recommended amputation at the MTP joint.   Subjective:  Patient seen and examined today with relatives at bedside. Continues to c/o toe pain, seen by ortho today and recommended amputation of the great toe. No acute events overnight   Assessment & Plan:   Diabetic foot infection R great toe cellulitis w/ osteomyelitis and necrosis of the tip of the toe  - Minimal improvement, No WBC or fever - MRI show suspicious for osteo ABI showed no compressible vessels.  - Continue Abx per ID  - ID recommendations appreciated - Ortho consulted - recommendation is for amputation of the R great toe - possible on Wednesday - ABI -  No need for revascularization at this point, pulses are palpable. - F/u blood cultures no growth UTD  DM type 1 -  CBG's during hospital stay well controlled - Continue current management  - Monitor CBG's   Morbid Obesity - Weight loss discussed  - Dietary consult  Hypertension - Stable  - Continue home medications  ESRD (end stage renal disease) on dialysis [facility]) - Renal consulted for routine dialysis.  DVT prophylaxis: Heparin sq Code Status: Full  Family Communication: family at bedside, plan discussed and explained in details  Disposition Plan: Anticipated discharge home when medically impropved  Consultants:   ID   Procedures:    ABI - Bilateral ABI non-compressible  Antimicrobials:  Rocephin 06/20/16 -06/21/16  Flagyl 06/20/16 - 06/21/16  Vanco 06/20/16  Ceftaz 06/21/16   Objective: Vitals:   06/22/16 0502 06/22/16 1401 06/22/16 2105 06/23/16 0737  BP: 133/80 139/82 (!) 149/84 138/87  Pulse: 86 82 91 94  Resp: 18   16  Temp: 98.7 F (37.1 C) 98.7 F (37.1 C) 97.8 F (36.6 C) 97.6 F (36.4 C)  TempSrc: Oral Oral Oral Oral  SpO2: 95% 98% 99% 98%  Weight:      Height:       No intake or output data in the 24 hours ending 06/23/16 1056 Filed Weights   06/19/16 2059 06/21/16 1430 06/21/16 1850  Weight: 119.3 kg (263 lb) 126.6 kg (279 lb 1.6 oz) 122.5 kg (270 lb 1 oz)    Examination:  General exam: Appears calm and comfortable  Respiratory system: Clear to auscultation. No wheezes,crackle or rhonchi Cardiovascular system: S1 & S2 heard, RRR. No JVD, murmurs, rubs or gallops, DP and PT pulses palpables 1+. Toes cap refill < 2 seconds Gastrointestinal system: Abdomen is nondistended, soft and nontender. Central nervous system: Alert and oriented. No focal neurological deficits. Extremities: No pedal edema. ischemic ulcer over the IP joint of the right great toe with necrotic changes to the tip of the toe. Redness and cellulitis of the entire toe. l. Skin: Multiple scabs and excoriations in all 4 extremities  Psychiatry: Judgement and insight appear normal. Mood & affect appropriate.    Data Reviewed: I have personally reviewed  following labs and imaging studies  CBC:  Recent Labs Lab 06/20/16 0147 06/20/16 0205 06/23/16 0502  WBC 5.2  --  5.3  NEUTROABS 3.5  --  3.3  HGB 10.2* 12.9 9.6*  HCT 33.9* 38.0 32.4*  MCV 104.6*  --  105.2*  PLT 213  --  201   Basic Metabolic Panel:  Recent Labs Lab 06/20/16 0205 06/21/16 1720 06/23/16 0502  NA 135 138 136  K 3.8 4.2 4.4  CL 92* 99* 96*  CO2  --  27 28  GLUCOSE 193* 114* 117*  BUN 26* 25* 31*  CREATININE 3.70* 3.69* 5.21*  CALCIUM   --  9.2 9.6  PHOS  --  3.9  --    GFR: Estimated Creatinine Clearance: 20.6 mL/min (by C-G formula based on SCr of 5.21 mg/dL (H)). Liver Function Tests:  Recent Labs Lab 06/21/16 1720  ALBUMIN 3.3*   No results for input(s): LIPASE, AMYLASE in the last 168 hours. No results for input(s): AMMONIA in the last 168 hours. Coagulation Profile: No results for input(s): INR, PROTIME in the last 168 hours. Cardiac Enzymes: No results for input(s): CKTOTAL, CKMB, CKMBINDEX, TROPONINI in the last 168 hours. BNP (last 3 results) No results for input(s): PROBNP in the last 8760 hours. HbA1C: No results for input(s): HGBA1C in the last 72 hours. CBG:  Recent Labs Lab 06/22/16 1149 06/22/16 1639 06/22/16 2126 06/22/16 2208 06/23/16 0711  GLUCAP 133* 97 62* 109* 110*   Lipid Profile: No results for input(s): CHOL, HDL, LDLCALC, TRIG, CHOLHDL, LDLDIRECT in the last 72 hours. Thyroid Function Tests: No results for input(s): TSH, T4TOTAL, FREET4, T3FREE, THYROIDAB in the last 72 hours. Anemia Panel: No results for input(s): VITAMINB12, FOLATE, FERRITIN, TIBC, IRON, RETICCTPCT in the last 72 hours. Sepsis Labs: No results for input(s): PROCALCITON, LATICACIDVEN in the last 168 hours.  Recent Results (from the past 240 hour(s))  Blood Cultures x 2 sites     Status: None (Preliminary result)   Collection Time: 06/20/16  4:25 AM  Result Value Ref Range Status   Specimen Description BLOOD RIGHT FOREARM  Final   Special Requests BOTTLES DRAWN AEROBIC AND ANAEROBIC  Final   Culture NO GROWTH 2 DAYS  Final   Report Status PENDING  Incomplete  Blood Cultures x 2 sites     Status: None (Preliminary result)   Collection Time: 06/20/16  6:30 AM  Result Value Ref Range Status   Specimen Description BLOOD RIGHT HAND  Final   Special Requests IN PEDIATRIC BOTTLE 3CC  Final   Culture NO GROWTH 2 DAYS  Final   Report Status PENDING  Incomplete  MRSA PCR Screening     Status: None    Collection Time: 06/20/16  6:31 AM  Result Value Ref Range Status   MRSA by PCR NEGATIVE NEGATIVE Final    Comment:        The GeneXpert MRSA Assay (FDA approved for NASAL specimens only), is one component of a comprehensive MRSA colonization surveillance program. It is not intended to diagnose MRSA infection nor to guide or monitor treatment for MRSA infections.      Radiology Studies: Mr Toes Right W/o Cm  Result Date: 06/22/2016 CLINICAL DATA:  Diabetic patient with an ulceration on the right great toe. Question osteomyelitis or abscess. EXAM: MRI OF THE RIGHT TOES WITHOUT CONTRAST TECHNIQUE: Multiplanar, multisequence MR imaging was performed. No intravenous contrast was administered. COMPARISON:  Plain films the right foot 06/20/2016. FINDINGS: The skin surface  appears irregular along the medial margin of the right great toe at the level of the distal phalanx. Mild marrow edema is seen in the tuft of the distal phalanx. Bone marrow signal is otherwise unremarkable. No joint effusion is identified. No abscess is seen. Mild subcutaneous edema over the dorsum of the foot is noted. IMPRESSION: Ulceration on the distal great toe. Mild marrow edema in the tuft of the distal phalanx is worrisome for osteomyelitis. Negative for abscess or septic joint. Electronically Signed   By: Drusilla Kannerhomas  Dalessio M.D.   On: 06/22/2016 12:38    Scheduled Meds: . atorvastatin  40 mg Oral QHS  . calcitRIOL  1 mcg Oral Q T,Th,Sa-HD  . carvedilol  25 mg Oral BID WC  . cefTAZidime (FORTAZ)  IV  2 g Intravenous Q T,Th,Sat-1800  . feeding supplement (NEPRO CARB STEADY)  237 mL Oral QPC lunch  . heparin  5,000 Units Subcutaneous Q8H  . hydrOXYzine  25 mg Oral Q6H  . insulin aspart  0-9 Units Subcutaneous TID WC  . insulin aspart  4 Units Subcutaneous TID WC  . insulin glargine  20 Units Subcutaneous QHS  . isosorbide dinitrate  20 mg Oral TID  . mupirocin cream   Topical Daily  . sevelamer carbonate  3,200 mg  Oral TID WC  . vancomycin  1,000 mg Intravenous Q T,Th,Sa-HD   Continuous Infusions:    LOS: 3 days    Latrelle DodrillEdwin Silva, MD [facility] Pager [phone]  If 7PM-7AM, please contact night-coverage [email or web address] Password [email or web address] 06/23/2016, 10:56 AM

## 2016-06-23 NOTE — Progress Notes (Signed)
Inpatient Diabetes Program Recommendations  AACE/ADA: New Consensus Statement on Inpatient Glycemic Control (2015)  Target Ranges:  Prepandial:   less than 140 mg/dL      Peak postprandial:   less than 180 mg/dL (1-2 hours)      Critically ill patients:  140 - 180 mg/dL   Lab Results  Component Value Date   GLUCAP 130 (H) 06/23/2016   HGBA1C 5.1 06/20/2016    Review of Glycemic Control:  Results for Beth, [name] (MRN [id number]) as of 06/23/2016 11:39  Ref. Range 06/22/2016 06:08 06/22/2016 11:49 06/22/2016 16:39 06/22/2016 21:26 06/22/2016 22:08 06/23/2016 07:11 06/23/2016 10:56  Glucose-Capillary Latest Ref Range: 65 - 99 mg/dL 101 (H) 751 (H) 97 62 (L) 109 (H) 110 (H) 130 (H)   Diabetes history: Type 1 diabetes-ESRD Outpatient Diabetes medications: Lantus 20 units q HS, Novolog 12-20 units tid with meals Current orders for Inpatient glycemic control:  Lantus 20 units daily, Novolog sensitive tid with meals, Novolog 4 units tid with meals  Inpatient Diabetes Program Recommendations:    Please consider reducing Lantus to 16 units daily and decrease Novolog meal coverage to 3 units tid with meals.  Called and discussed with MD.   Thanks, Beryl Meager, RN, BC-ADM Inpatient Diabetes Coordinator Pager [phone] (8a-5p)

## 2016-06-23 NOTE — Consult Note (Signed)
ORTHOPAEDIC CONSULTATION  REQUESTING PHYSICIAN: Lenox Ponds, MD  Chief Complaint: Ischemic gangrenous pain right great toe  HPI: [name] is a 35 y.o. female who presents with a two-week history of ischemic ulcer of the IP joint of the right great toe. Patient states she has been going to podiatry for debridement of the ulcer. 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  Past Medical History:  Diagnosis Date  . Anemia   . Arthritis    "left hand" (09/15/2013)  . Asthma   . Chronic bronchitis (HCC)    "just about q yr" (09/15/2013)  . Chronic kidney disease    "low kidney function" (09/15/2013)  . COPD (chronic obstructive pulmonary disease) (HCC)   . Coronary artery disease   . Hyperlipidemia   . Hypertension   . Migraine    "get them alot" (09/15/2013)  . Myocardial infarction [facility]) 04/2015   NSTEMI  . Normal coronary arteries    by cardiac catheterization 09/20/13  . Pneumonia    "couple times; have it now" (09/15/2013)  . Shortness of breath    "just recently; related to the pneumonia" (09/15/2013)  . Type 1 diabetes mellitus (HCC)    type 2   Past Surgical History:  Procedure Laterality Date  . AV FISTULA PLACEMENT Left 03/27/2015   Procedure: CREATION RADIAL CEPHALIC ARTERIOVENOUS FISTULA;  Surgeon: Chuck Hint, MD;  Location: [facility] OR;  Service: Vascular;  Laterality: Left;  . AV FISTULA PLACEMENT Left 11/23/2015   Procedure:  LEFT ARM BASILIC VEIN TRANSPOSITION  ;  Surgeon: Chuck Hint, MD;  Location: [facility] OR;  Service: Vascular;  Laterality: Left;  . CESAREAN SECTION  1999; 2006  . CORONARY ANGIOGRAM  09/20/2013   Procedure: CORONARY ANGIOGRAM;  Surgeon: Runell Gess, MD;  Location: [facility] CATH LAB;  Service: Cardiovascular;;  . FINGER SURGERY Left 1985   3rd and 4th digits  reconstructed after cut off" (09/15/2013)  . SHOULDER ARTHROSCOPY WITH BICEPSTENOTOMY Right 05/10/2015   Procedure: RIGHT SHOULDER ARTHROSCOPY WITH BICEPS TENOTOMY, DEBRIDEMENT LABRAL TEAR;  Surgeon: Jones Broom, MD;  Location: [facility] OR;  Service: Orthopedics;  Laterality: Right;  Right shoulder arthroscopy biceps tenotomy, debridement labral tear  . TONSILLECTOMY  1997  . TUBAL LIGATION  2006   Social History   Social History  . Marital status: Single    Spouse name: N/A  . Number of children: 2  . Years of education: N/A   Occupational History  . Student    Social History Main Topics  . Smoking status: Never Smoker  . Smokeless tobacco: Never Used  . Alcohol use No  . Drug use: No  . Sexual activity: Not Currently    Partners: Male    Birth control/ protection: Other-see comments     Comment: S/P tubal ligation   Other Topics Concern  . None   Social History Narrative   Single.  Moved here from [place].  Lives with 2 children.     Family History  Problem Relation Age of Onset  . Diabetes Mother   . Stroke Mother   . Hypertension Father   . Hyperlipidemia Father   . Cancer - Lung Father   . Diabetes Maternal Grandmother   . Cancer Paternal Grandmother    - negative except otherwise stated in the family history section Allergies  Allergen Reactions  .  Aspirin Anaphylaxis  . Sulfur Hives  . Tramadol Other (See Comments)    Pt states she feels weird    Prior to Admission medications   Medication Sig Start Date End Date Taking? Authorizing Provider  albuterol (PROVENTIL HFA;VENTOLIN HFA) 108 (90 BASE) MCG/ACT inhaler Inhale 2 puffs into the lungs every 6 (six) hours as needed for wheezing or shortness of breath.    Yes Historical Provider, MD  albuterol (PROVENTIL) (2.5 MG/3ML) 0.083% nebulizer solution Take 2.5 mg by nebulization every 6 (six) hours as needed for wheezing or shortness of breath.   Yes Historical Provider, MD  atorvastatin (LIPITOR) 40 MG tablet  Take 40 mg by mouth at bedtime.   Yes Historical Provider, MD  budesonide (PULMICORT) 0.25 MG/2ML nebulizer solution Take 0.25 mg by nebulization 2 (two) times daily as needed (shortness of breath or wheezing).   Yes Historical Provider, MD  calcitRIOL (ROCALTROL) 0.25 MCG capsule Take 0.25 mcg by mouth daily.   Yes Historical Provider, MD  carvedilol (COREG) 25 MG tablet Take 25 mg by mouth 2 (two) times daily.   Yes Historical Provider, MD  hydrOXYzine (ATARAX/VISTARIL) 25 MG tablet Take 1 tablet (25 mg total) by mouth every 6 (six) hours. 03/30/16  Yes Rolland PorterMark James, MD  insulin aspart (NOVOLOG FLEXPEN) 100 UNIT/ML FlexPen Inject 12-20 Units into the skin 3 (three) times daily with meals. Per sliding scale - based on carb count and CBG   Yes Historical Provider, MD  Insulin Glargine (LANTUS SOLOSTAR) 100 UNIT/ML Solostar Pen Inject 20 Units into the skin at bedtime.   Yes Historical Provider, MD  isosorbide dinitrate (ISORDIL) 20 MG tablet Take 20 mg by mouth 3 (three) times daily.   Yes Historical Provider, MD  sevelamer (RENAGEL) 800 MG tablet Take 800 mg by mouth daily.   Yes Historical Provider, MD   Mr Toes Right W/o Cm  Result Date: 06/22/2016 CLINICAL DATA:  Diabetic patient with an ulceration on the right great toe. Question osteomyelitis or abscess. EXAM: MRI OF THE RIGHT TOES WITHOUT CONTRAST TECHNIQUE: Multiplanar, multisequence MR imaging was performed. No intravenous contrast was administered. COMPARISON:  Plain films the right foot 06/20/2016. FINDINGS: The skin surface appears irregular along the medial margin of the right great toe at the level of the distal phalanx. Mild marrow edema is seen in the tuft of the distal phalanx. Bone marrow signal is otherwise unremarkable. No joint effusion is identified. No abscess is seen. Mild subcutaneous edema over the dorsum of the foot is noted. IMPRESSION: Ulceration on the distal great toe. Mild marrow edema in the tuft of the distal phalanx is  worrisome for osteomyelitis. Negative for abscess or septic joint. Electronically Signed   By: Drusilla Kannerhomas  Dalessio M.D.   On: 06/22/2016 12:38   - pertinent xrays, CT, MRI studies were reviewed and independently interpreted  Positive ROS: All other systems have been reviewed and were otherwise negative with the exception of those mentioned in the HPI and as above.  Physical Exam: General: Alert, no acute distress Psychiatric: Patient is competent for consent with normal mood and affect Lymphatic: No axillary or cervical lymphadenopathy Cardiovascular: No pedal edema Respiratory: No cyanosis, no use of accessory musculature GI: No organomegaly, abdomen is soft and non-tender  Skin: Patient has ischemic ulcer over the IP joint of the right great toe she has black gangrenous changes to the tip of the toe. Patient has redness and cellulitis of the entire toe with some mild ischemic changes to the MTP joint  region plantarly. Patient has multiple excoriations on her upper and lower extremities patient states this is from her high phosphorus level.   Neurologic: Patient does not have protective sensation bilateral lower extremities.   MUSCULOSKELETAL:  On examination patient does have a palpable dorsalis pedis pulse. There is no ascending cellulitis proximal to the MTP joint. Review of the radiographs shows no destructive bony changes however review of the MRI scan shows osteomyelitis of the tuft of the right great toe. Review of the ankle-brachial indices shows severe peripheral vascular disease with noncompressible vessels at the ankle with monophasic and biphasic flow. Patient does have a strong palpable dorsalis pedis pulse.  Assessment: Assessment: Diabetic insensate neuropathy end-stage renal disease on dialysis with gangrenous changes with osteomyelitis of the right great toe.  Plan: Plan: Discussed with the patient her only option would be to proceed with an amputation of the great toe at  the MTP joint. Discussed with her severe microcirculatory disease that she is at risk of the wound not healing. With a strong dorsalis pedis pulse at this point I do not feel that evaluation for revascularization is necessary. Patient is concerned that a ulcer that she is been undergoing debridement for several weeks now requires amputation. I will reevaluate Tuesday morning and we will discuss possibility of surgery on Wednesday.  Thank you for the consult and the opportunity to see Ms. Kandace Blitz, MD [facility] [phone] 6:51 AM

## 2016-06-24 DIAGNOSIS — M869 Osteomyelitis, unspecified: Secondary | ICD-10-CM

## 2016-06-24 DIAGNOSIS — M868X7 Other osteomyelitis, ankle and foot: Secondary | ICD-10-CM

## 2016-06-24 DIAGNOSIS — E1021 Type 1 diabetes mellitus with diabetic nephropathy: Secondary | ICD-10-CM

## 2016-06-24 LAB — GLUCOSE, CAPILLARY
GLUCOSE-CAPILLARY: 77 mg/dL (ref 65–99)
GLUCOSE-CAPILLARY: 157 mg/dL — AB (ref 65–99)
Glucose-Capillary: 119 mg/dL — ABNORMAL HIGH (ref 65–99)

## 2016-06-24 LAB — COMPREHENSIVE METABOLIC PANEL
ALT: 24 U/L (ref 14–54)
AST: 22 U/L (ref 15–41)
Albumin: 3.1 g/dL — ABNORMAL LOW (ref 3.5–5.0)
Alkaline Phosphatase: 141 U/L — ABNORMAL HIGH (ref 38–126)
Anion gap: 13 (ref 5–15)
BUN: 40 mg/dL — AB (ref 6–20)
CHLORIDE: 98 mmol/L — AB (ref 101–111)
CO2: 27 mmol/L (ref 22–32)
CREATININE: 6.54 mg/dL — AB (ref 0.44–1.00)
Calcium: 9.9 mg/dL (ref 8.9–10.3)
GFR calc non Af Amer: 7 mL/min — ABNORMAL LOW (ref >60)
GFR, EST AFRICAN AMERICAN: 9 mL/min — AB (ref >60)
Glucose, Bld: 73 mg/dL (ref 65–99)
POTASSIUM: 4.8 mmol/L (ref 3.5–5.1)
SODIUM: 138 mmol/L (ref 135–145)
Total Bilirubin: 1.7 mg/dL — ABNORMAL HIGH (ref 0.3–1.2)
Total Protein: 6.3 g/dL — ABNORMAL LOW (ref 6.5–8.1)

## 2016-06-24 LAB — HEPATITIS B SURFACE ANTIBODY,QUALITATIVE: HEP B S AB: NONREACTIVE

## 2016-06-24 LAB — HEPATITIS B CORE ANTIBODY, TOTAL: Hep B Core Total Ab: NEGATIVE

## 2016-06-24 LAB — MAGNESIUM: Magnesium: 2.5 mg/dL — ABNORMAL HIGH (ref 1.7–2.4)

## 2016-06-24 LAB — CBC
HEMATOCRIT: 31.2 % — AB (ref 36.0–46.0)
HEMOGLOBIN: 9.3 g/dL — AB (ref 12.0–15.0)
MCH: 31.3 pg (ref 26.0–34.0)
MCHC: 29.8 g/dL — ABNORMAL LOW (ref 30.0–36.0)
MCV: 105.1 fL — AB (ref 78.0–100.0)
Platelets: 214 10*3/uL (ref 150–400)
RBC: 2.97 MIL/uL — ABNORMAL LOW (ref 3.87–5.11)
RDW: 17.8 % — AB (ref 11.5–15.5)
WBC: 5 10*3/uL (ref 4.0–10.5)

## 2016-06-24 MED ORDER — DEXTROSE 5 % IV SOLN
3.0000 g | INTRAVENOUS | Status: DC
  Filled 2016-06-24 (×2): qty 3000

## 2016-06-24 MED ORDER — CALCITRIOL 0.5 MCG PO CAPS
ORAL_CAPSULE | ORAL | Status: AC
  Administered 2016-06-24: 1 ug via ORAL
  Filled 2016-06-24: qty 2

## 2016-06-24 MED ORDER — VANCOMYCIN HCL IN DEXTROSE 1-5 GM/200ML-% IV SOLN
INTRAVENOUS | Status: AC
  Administered 2016-06-24: 1000 mg via INTRAVENOUS
  Filled 2016-06-24: qty 200

## 2016-06-24 NOTE — Progress Notes (Signed)
INFECTIOUS DISEASE PROGRESS NOTE  ID: [name] is a 35 y.o. 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  Continued foot pain  Abtx:  Anti-infectives    Start     Dose/Rate Route Frequency Ordered Stop   06/25/16 1145  ceFAZolin (ANCEF) 3 g in dextrose 5 % 50 mL IVPB     3 g 130 mL/hr over 30 Minutes Intravenous On call to O.R. 06/24/16 1018 06/26/16 0559   06/21/16 1800  cefTAZidime (FORTAZ) 2 g in dextrose 5 % 50 mL IVPB     2 g 100 mL/hr over 30 Minutes Intravenous Every T-Th-Sa (1800) 06/21/16 1533     06/21/16 1200  vancomycin (VANCOCIN) IVPB 1000 mg/200 mL premix     1,000 mg 200 mL/hr over 60 Minutes Intravenous Every T-Th-Sa (Hemodialysis) 06/20/16 0507     06/20/16 0515  vancomycin (VANCOCIN) 2,000 mg in sodium chloride 0.9 % 500 mL IVPB     2,000 mg 250 mL/hr over 120 Minutes Intravenous  Once 06/20/16 0507 06/20/16 0800   06/20/16 0330  cefTRIAXone (ROCEPHIN) 2 g in dextrose 5 % 50 mL IVPB  Status:  Discontinued     2 g 100 mL/hr over 30 Minutes Intravenous Every 24 hours 06/20/16 0320 06/21/16 1530   06/20/16 0330  metroNIDAZOLE (FLAGYL) IVPB 500 mg  Status:  Discontinued     500 mg 100 mL/hr over 60 Minutes Intravenous Every 8 hours 06/20/16 0320 06/21/16 1530      Medications:  Scheduled: . atorvastatin  40 mg Oral QHS  . calcitRIOL  1 mcg Oral Q T,Th,Sa-HD  . carvedilol  25 mg Oral BID WC  . [START ON 06/25/2016]  ceFAZolin (ANCEF) IV  3 g Intravenous On Call to OR  . cefTAZidime (FORTAZ)  IV  2 g Intravenous Q T,Th,Sat-1800  . feeding supplement (NEPRO CARB  STEADY)  237 mL Oral QPC lunch  . heparin  5,000 Units Subcutaneous Q8H  . hydrOXYzine  25 mg Oral Q6H  . insulin aspart  0-9 Units Subcutaneous TID WC  . insulin aspart  3 Units Subcutaneous TID WC  . insulin glargine  16 Units Subcutaneous QHS  . isosorbide dinitrate  20 mg Oral TID  . mupirocin cream   Topical Daily  . sevelamer carbonate  3,200 mg Oral TID WC  . vancomycin  1,000 mg Intravenous Q T,Th,Sa-HD    Objective: Vital signs in last 24 hours: Temp:  [97.6 F (36.4 C)-97.8 F (36.6 C)] 97.8 F (36.6 C) (09/26 0826) Pulse Rate:  [78-97] 95 (09/26 1230) Resp:  [16] 16 (09/26 0826) BP: (115-153)/(67-104) 153/85 (09/26 1230) SpO2:  [94 %-96 %] 94 % (09/26 0507) Weight:  [123.7 kg (272 lb 11.3 oz)] 123.7 kg (272 lb 11.3 oz) (09/26 0826)   General appearance: alert, cooperative and no distress Skin: toe dressed.   Lab Results  Recent Labs  06/23/16 0502 06/24/16 0831 06/24/16 0842  WBC 5.3  --  5.0  HGB 9.6*  --  9.3*  HCT 32.4*  --  31.2*  NA 136 138  --  K 4.4 4.8  --   CL 96* 98*  --   CO2 28 27  --   BUN 31* 40*  --   CREATININE 5.21* 6.54*  --    Liver Panel  Recent Labs  06/21/16 1720 06/24/16 0831  PROT  --  6.3*  ALBUMIN 3.3* 3.1*  AST  --  22  ALT  --  24  ALKPHOS  --  141*  BILITOT  --  1.7*   Sedimentation Rate  Recent Labs  06/23/16 0502  ESRSEDRATE 28*   C-Reactive Protein  Recent Labs  06/23/16 0502  CRP 0.6    Microbiology: Recent Results (from the past 240 hour(s))  Blood Cultures x 2 sites     Status: None (Preliminary result)   Collection Time: 06/20/16  4:25 AM  Result Value Ref Range Status   Specimen Description BLOOD RIGHT FOREARM  Final   Special Requests BOTTLES DRAWN AEROBIC AND ANAEROBIC  Final   Culture NO GROWTH 3 DAYS  Final   Report Status PENDING  Incomplete  Blood Cultures x 2 sites     Status: None (Preliminary result)   Collection Time: 06/20/16  6:30 AM  Result Value Ref Range Status    Specimen Description BLOOD RIGHT HAND  Final   Special Requests IN PEDIATRIC BOTTLE 3CC  Final   Culture NO GROWTH 3 DAYS  Final   Report Status PENDING  Incomplete  MRSA PCR Screening     Status: None   Collection Time: 06/20/16  6:31 AM  Result Value Ref Range Status   MRSA by PCR NEGATIVE NEGATIVE Final    Comment:        The GeneXpert MRSA Assay (FDA approved for NASAL specimens only), is one component of a comprehensive MRSA colonization surveillance program. It is not intended to diagnose MRSA infection nor to guide or monitor treatment for MRSA infections.     Studies/Results: No results found.   Assessment/Plan: Diabetic Foot Infection/osteo DM1 ESRD  Total days of antibiotics: 3 vanco/ceftaz  Amputation tomorrow Would give her 2 weeks of ceftaz/vanco with HD afterwards.  End date 07-08-16 Available as needed.       Johny Sax Infectious Diseases (pager) [phone] [email or web address] 06/24/2016, 1:07 PM  LOS: 4 days

## 2016-06-24 NOTE — Progress Notes (Addendum)
Pharmacy Antibiotic Note  [name] is a 35 y.o. female with ESRD on HD TTS was admitted on 06/20/2016 with right toe infection/osteo.  Pharmacy has been consulted for vancomycin and ceftazidime dosing.  Amputation planned for tomorrow, 9/27. Per ID, antibiotics are to be continued for two weeks following procedure with end date of 07/08/16. Patient tolerated HD session today well.  Plan: Vancomycin 1g IV every TTS w/ HD Ceftazidime 2g IV every TTS at 1800 Monitor clinical progress, HD tolerance, and pre-HD VR as indicated   Height: 5\' 8"  (172.7 cm) Weight: 268 lb 4.8 oz (121.7 kg) IBW/kg (Calculated) : 63.9  Temp (24hrs), Avg:97.8 F (36.6 C), Min:97.6 F (36.4 C), Max:98.1 F (36.7 C)   Recent Labs Lab 06/20/16 0147 06/20/16 0205 06/21/16 1720 06/23/16 0502 06/24/16 0831 06/24/16 0842  WBC 5.2  --   --  5.3  --  5.0  CREATININE  --  3.70* 3.69* 5.21* 6.54*  --     Estimated Creatinine Clearance: 16.3 mL/min (by C-G formula based on SCr of 6.54 mg/dL (H)).    Allergies  Allergen Reactions  . Aspirin Anaphylaxis  . Sulfur Hives  . Tramadol Other (See Comments)    Pt states she feels weird     Antimicrobials this admission: 9/22 Ceftriaxone >> 9/23 9/22 Flagyl >> 9/23 9/23 Ceftaz >> (10/10) 9/22 Vancomycin >> (10/10)  Dose adjustments this admission: N/A  Microbiology results: 9/22 BCx: NGx3d 9/22 MRSA PCR: negative  Thank you for allowing pharmacy to be a part of this patient's care.  Carylon Perches, PharmD Acute Care Pharmacy Resident  Pager: [phone] 06/24/2016

## 2016-06-24 NOTE — Plan of Care (Signed)
Problem: Bowel/Gastric: Goal: Will not experience complications related to bowel motility Outcome: Progressing No bowel issues reported

## 2016-06-24 NOTE — Progress Notes (Signed)
Responded to pt call to find pt upset that she ordered lunch and called twice to follow up on it and menu office stated it left kitchen at 2:37 and arrived on floor at 2:38.  Pt stated she was going to order a pizza. RN offered frozen meal from unit freezer and was told by pt to just get the hell out and discharge her from this place .  Lunch meal just arrived on floor at now 1455 after initial order being placed at 1330.

## 2016-06-24 NOTE — Plan of Care (Signed)
Problem: Skin Integrity: Goal: Risk for impaired skin integrity will decrease Outcome: Progressing Continue wound care to right great toe as ordered

## 2016-06-24 NOTE — Progress Notes (Signed)
[facility] Progress Note   Dialysis Orders: TTS [facility] 4.25 hours 200 Optiflux 450/800 EDW 123.5 2 K 2 Ca heparin 12K left upper AVF calcitriol 1 venofer 50 q thur and Aranesp 200 q Thursday - both last given 9/21 Recent labs: hgb 9.6 iPTH 90- calcitriol just lowered Ca/P ok hgb A1c 5.5 July  Assessment/Plan: 1. Right great toe Osteomyelitis - Amputation tomorrow 9/27- Vanc and Elita QuickFortaz per ID 2. ESRD -  TTS K 4.2 Sat pre HD - no issues with treatment- next HD Tuesday 9/26 - prob lower edw at d/c, will get standing post-wt 3. Hypertension/volume  - gets to EDW/gains variable; net UF 4.6 Saturday with post weight 122.5. BP 130 - 140s during HD treatment. EDW can maybe come down a little more..  As above 4. Anemia  - hgb per CBC 10.2 - stable on weekly ESA and Fe 5. Metabolic bone disease -  Ca/P ok- on VDRA/ binders 6. Nutrition - alb 3.3 VERY unhappy with restricted diet.  Labs/good diabetes control indicate that she is able to make appropriate selections in food choices as an outpatient.  Have liberalized diet to regular to provide her more variety in selections 7. DM - per primary - BS ok hgb A1c 5.1   Bufford ButtnerElizabeth Rakiya Krawczyk MD [facility] pager [phone]    cell [phone] 06/24/2016, 8:57 AM    Subjective:   Sleepy this AM .  R great toe pain.  Objective Vitals:   06/24/16 0507 06/24/16 0826 06/24/16 0835 06/24/16 0840  BP: 138/85 140/84 (!) 150/86 (!) 147/87  Pulse: 85 93 88 88  Resp: 16 16    Temp: 97.7 F (36.5 C) 97.8 F (36.6 C)    TempSrc: Oral Oral    SpO2: 94%     Weight:  123.7 kg (272 lb 11.3 oz)    Height:       Physical Exam General: NAD sleepy but arousable Heart: RRR Lungs: no rales Abdomen: obese/large pannus, soft NT Extremities: maybe tr LE edema, R great toe with necrotic area at tip and some ulcerations. Dialysis Access:  Left AVF + bruit   Additional Objective Labs: Basic Metabolic Panel:  Recent Labs Lab  06/20/16 0205 06/21/16 1720 06/23/16 0502  NA 135 138 136  K 3.8 4.2 4.4  CL 92* 99* 96*  CO2  --  27 28  GLUCOSE 193* 114* 117*  BUN 26* 25* 31*  CREATININE 3.70* 3.69* 5.21*  CALCIUM  --  9.2 9.6  PHOS  --  3.9  --    Liver Function Tests:  Recent Labs Lab 06/21/16 1720  ALBUMIN 3.3*  CBC:  Recent Labs Lab 06/20/16 0147 06/20/16 0205 06/23/16 0502  WBC 5.2  --  5.3  NEUTROABS 3.5  --  3.3  HGB 10.2* 12.9 9.6*  HCT 33.9* 38.0 32.4*  MCV 104.6*  --  105.2*  PLT 213  --  201   Blood Culture    Component Value Date/Time   SDES BLOOD RIGHT HAND 06/20/2016 0630   SPECREQUEST IN PEDIATRIC BOTTLE 3CC 06/20/2016 0630   CULT NO GROWTH 3 DAYS 06/20/2016 0630   REPTSTATUS PENDING 06/20/2016 0630    CBG:  Recent Labs Lab 06/23/16 0711 06/23/16 1056 06/23/16 1634 06/23/16 2102 06/24/16 0623  GLUCAP 110* 130* 146* 136* 77   Studies/Results: No results found. Medications:   . atorvastatin  40 mg Oral QHS  . calcitRIOL  1 mcg Oral Q T,Th,Sa-HD  . carvedilol  25 mg Oral  BID WC  . cefTAZidime (FORTAZ)  IV  2 g Intravenous Q T,Th,Sat-1800  . feeding supplement (NEPRO CARB STEADY)  237 mL Oral QPC lunch  . heparin  5,000 Units Subcutaneous Q8H  . hydrOXYzine  25 mg Oral Q6H  . insulin aspart  0-9 Units Subcutaneous TID WC  . insulin aspart  3 Units Subcutaneous TID WC  . insulin glargine  16 Units Subcutaneous QHS  . isosorbide dinitrate  20 mg Oral TID  . mupirocin cream   Topical Daily  . sevelamer carbonate  3,200 mg Oral TID WC  . vancomycin  1,000 mg Intravenous Q T,Th,Sa-HD

## 2016-06-24 NOTE — Procedures (Signed)
Patient seen on Hemodialysis. QB 400 UF goal 2.5L.  Tolerating HD well, will need to decrease EDW again. Treatment adjusted as needed.  Bufford Buttner MD [facility]. Cell [phone] Pager #[phone] 8:59 AM

## 2016-06-24 NOTE — Progress Notes (Signed)
Patient ID: [name], female   DOB: [birth year], 34 y.o.   MRN: [id number] Plan for dialysis today and amputation of right great toe tomorrow Wednesday. Patient is in agreement to proceed with surgery all questions were answered.

## 2016-06-24 NOTE — Progress Notes (Signed)
PROGRESS NOTE    [name]  TAV:[id number] DOB: 08/28/1981 DOA: 06/20/2016 PCP: [name], FNP     Brief Narrative:  35 yo BF PMHx Migraine, DM Type 1 controlled with complications,COPD, HLD, HTN,NSTEMI, Chronic Systolic and Diastolic CHF, ESRD on HD T/Th/Sat,.    Presented to the ED with progressively worsening of R great toe ulcer an infection, which failed abx therapy x 2 and had a procedure done by podiatry.  Admitted for infected wound and cellulitis of the right great toe. Placed on IV abx, ID consulted, MRI ordered suspicious for osteo. Orthopedic consulted. Patient had an ABI that showed no compressible vessels. Necrosis of the tip of the toe, ortho recommended amputation at the MTP joint.    Subjective: 9/26 A/O 4, NAD. Patient extremely upset per patient about multiple things, feels she has been treated very poorly. When pressed for specifics only specific patient could give was administered breakfast and lunch secondary to being in HD.        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  - Continue Abx per ID  - Ortho consulted; recommendation is for amputation of the R great toe on Wednesday - F/u blood cultures no growth UTD  DM type 1 uncontrolled with, complications - 9/22 Hemoglobin A1c = 5.1   - Lantus 16 units daily -NovoLog 3 units QAC -Sensitive SSI  Morbid Obesity - Weight loss discussed  - Dietary consult  Chronic Systolic and Diastolic  CHF -10/23/2015 echocardiogram EF 35-40%, grade 2 diastolic dysfunction with moderate pericardial effusion -Obtain repeat echocardiogram prior to surgery  Hypertension   - Coreg 25 mg BID  -Imdur 20 mg TID  ESRD (end stage renal disease) on HD T/Th/Sat, - Renal consulted for routine dialysis.     DVT prophylaxis: Subcutaneous heparin Code Status: Full Family Communication: None Disposition Plan: Per surgery   Consultants:  Dr.Jeffrey C Hatcher ID Dr.Elizabeth Signe Colt Nephrology Dr.Marcus Kandis Mannan Orthopedic surgery   Procedures/Significant Events:  9/24 MRI right toe,:-Ulceration  distal great toe. Mild marrow edema in the tuft of the distal phalanx is worrisome for osteomyelitis    Cultures 9/22 blood right forearm/hand NGTD 9/22 MRSA by PCR negative   Antimicrobials: Fortaz 9/23>> Ceftriaxone 9/22>> 9/23 Flagyl 9/22>> 9/23 Vancomycin 9/22>>   Devices None   LINES / TUBES:  None    Continuous Infusions:    Objective: Vitals:   06/23/16 1300 06/23/16 2011 06/23/16 2015 06/24/16 0507  BP: (!) 137/95 (!) 149/104 (!) 146/90 138/85  Pulse: 84 92  85  Resp:    16  Temp: 97.5 F (36.4 C) 97.6 F (36.4 C)  97.7 F (36.5 C)  TempSrc: Oral Oral  Oral  SpO2: 98% 96%  94%  Weight:      Height:       No intake or output data in the 24 hours ending 06/24/16 0821 Filed Weights   06/19/16 2059 06/21/16 1430 06/21/16 1850  Weight: 119.3 kg (263 lb) 126.6  kg (279 lb 1.6 oz) 122.5 kg (270 lb 1 oz)    Examination:  General: A/O 4, NAD, No acute respiratory distress Eyes: negative scleral hemorrhage, negative anisocoria, negative icterus ENT: Negative Runny nose, negative gingival bleeding, Neck:  Negative scars, masses, torticollis, lymphadenopathy, JVD Lungs: Clear to auscultation bilaterally without wheezes or crackles Cardiovascular: Regular rate and rhythm without murmur gallop or rub normal S1 and S2 Abdomen: MORBIDLY OBESE, negative abdominal pain,  nondistended, positive soft, bowel sounds, no rebound, no ascites, no appreciable mass Extremities: positive right great toe cyanosis with swelling (did not take bandage down). DP/PT pulse 2+ bilateral  Skin: Negative rashes, lesions, ulcers Psychiatric:  Negative depression, negative anxiety, negative fatigue, negative mania  Central nervous system:  Cranial nerves II through XII intact, tongue/uvula midline, all extremities muscle strength 5/5, sensation intact throughout, negative dysarthria, negative expressive aphasia, negative receptive aphasia.  .     Data Reviewed: Care during the described time interval was provided by me .  I have reviewed this patient's available data, including medical history, events of note, physical examination, and all test results as part of my evaluation. I have personally reviewed and interpreted all radiology studies.  BC:  Recent Labs Lab 06/20/16 0147 06/20/16 0205 06/23/16 0502  WBC 5.2  --  5.3  NEUTROABS 3.5  --  3.3  HGB 10.2* 12.9 9.6*  HCT 33.9* 38.0 32.4*  MCV 104.6*  --  105.2*  PLT 213  --  201   Basic Metabolic Panel:  Recent Labs Lab 06/20/16 0205 06/21/16 1720 06/23/16 0502  NA 135 138 136  K 3.8 4.2 4.4  CL 92* 99* 96*  CO2  --  27 28  GLUCOSE 193* 114* 117*  BUN 26* 25* 31*  CREATININE 3.70* 3.69* 5.21*  CALCIUM  --  9.2 9.6  PHOS  --  3.9  --    GFR: Estimated Creatinine Clearance: 20.6 mL/min (by C-G formula based on SCr of 5.21 mg/dL (H)). Liver Function Tests:  Recent Labs Lab 06/21/16 1720  ALBUMIN 3.3*   No results for input(s): LIPASE, AMYLASE in the last 168 hours. No results for input(s): AMMONIA in the last 168 hours. Coagulation Profile: No results for input(s): INR, PROTIME in the last 168 hours. Cardiac Enzymes: No results for input(s): CKTOTAL, CKMB, CKMBINDEX, TROPONINI in the last 168 hours. BNP (last 3 results) No results for input(s): PROBNP in the last 8760 hours. HbA1C: No results for  input(s): HGBA1C in the last 72 hours. CBG:  Recent Labs Lab 06/23/16 0711 06/23/16 1056 06/23/16 1634 06/23/16 2102 06/24/16 0623  GLUCAP 110* 130* 146* 136* 77   Lipid Profile: No results for input(s): CHOL, HDL, LDLCALC, TRIG, CHOLHDL, LDLDIRECT in the last 72 hours. Thyroid Function Tests: No results for input(s): TSH, T4TOTAL, FREET4, T3FREE, THYROIDAB in the last 72 hours. Anemia Panel: No results for input(s): VITAMINB12, FOLATE, FERRITIN, TIBC, IRON, RETICCTPCT in the last 72 hours. Sepsis Labs: No results for input(s): PROCALCITON, LATICACIDVEN in the last 168 hours.  Recent Results (from the past 240 hour(s))  Blood Cultures x 2 sites     Status: None (Preliminary result)   Collection Time: 06/20/16  4:25 AM  Result Value Ref Range Status   Specimen Description BLOOD RIGHT FOREARM  Final   Special Requests BOTTLES DRAWN AEROBIC AND ANAEROBIC 5ML  Final   Culture NO GROWTH 3 DAYS  Final   Report Status PENDING  Incomplete  Blood Cultures x 2 sites  Status: None (Preliminary result)   Collection Time: 06/20/16  6:30 AM  Result Value Ref Range Status   Specimen Description BLOOD RIGHT HAND  Final   Special Requests IN PEDIATRIC BOTTLE 3CC  Final   Culture NO GROWTH 3 DAYS  Final   Report Status PENDING  Incomplete  MRSA PCR Screening     Status: None   Collection Time: 06/20/16  6:31 AM  Result Value Ref Range Status   MRSA by PCR NEGATIVE NEGATIVE Final    Comment:        The GeneXpert MRSA Assay (FDA approved for NASAL specimens only), is one component of a comprehensive MRSA colonization surveillance program. It is not intended to diagnose MRSA infection nor to guide or monitor treatment for MRSA infections.          Radiology Studies: No results found.      Scheduled Meds: . atorvastatin  40 mg Oral QHS  . calcitRIOL  1 mcg Oral Q T,Th,Sa-HD  . carvedilol  25 mg Oral BID WC  . cefTAZidime (FORTAZ)  IV  2 g Intravenous Q T,Th,Sat-1800    . feeding supplement (NEPRO CARB STEADY)  237 mL Oral QPC lunch  . heparin  5,000 Units Subcutaneous Q8H  . hydrOXYzine  25 mg Oral Q6H  . insulin aspart  0-9 Units Subcutaneous TID WC  . insulin aspart  3 Units Subcutaneous TID WC  . insulin glargine  16 Units Subcutaneous QHS  . isosorbide dinitrate  20 mg Oral TID  . mupirocin cream   Topical Daily  . sevelamer carbonate  3,200 mg Oral TID WC  . vancomycin  1,000 mg Intravenous Q T,Th,Sa-HD   Continuous Infusions:    LOS: 4 days    Time spent:40 min    WOODS, Roselind Messier, MD [facility] Pager [phone]  If 7PM-7AM, please contact night-coverage [email or web address] Password [email or web address] 06/24/2016, 8:21 AM

## 2016-06-25 ENCOUNTER — Inpatient Hospital Stay (HOSPITAL_COMMUNITY): Payer: Medicaid Other | Admitting: Anesthesiology

## 2016-06-25 ENCOUNTER — Encounter (HOSPITAL_COMMUNITY)
Admission: EM | Disposition: A | Discharge status: Home / Self Care | Payer: Self-pay | Source: Home / Self Care | Attending: Family Medicine

## 2016-06-25 ENCOUNTER — Encounter (HOSPITAL_COMMUNITY): Payer: Self-pay | Admitting: Anesthesiology

## 2016-06-25 HISTORY — PX: AMPUTATION: SHX166

## 2016-06-25 LAB — GLUCOSE, CAPILLARY
GLUCOSE-CAPILLARY: 87 mg/dL (ref 65–99)
GLUCOSE-CAPILLARY: 91 mg/dL (ref 65–99)
GLUCOSE-CAPILLARY: 99 mg/dL (ref 65–99)
Glucose-Capillary: 72 mg/dL (ref 65–99)
Glucose-Capillary: 81 mg/dL (ref 65–99)

## 2016-06-25 LAB — CBC
HCT: 34.9 % — ABNORMAL LOW (ref 36.0–46.0)
Hemoglobin: 10.3 g/dL — ABNORMAL LOW (ref 12.0–15.0)
MCH: 31.5 pg (ref 26.0–34.0)
MCHC: 29.5 g/dL — AB (ref 30.0–36.0)
MCV: 106.7 fL — AB (ref 78.0–100.0)
Platelets: 208 10*3/uL (ref 150–400)
RBC: 3.27 MIL/uL — ABNORMAL LOW (ref 3.87–5.11)
RDW: 17.6 % — AB (ref 11.5–15.5)
WBC: 4.5 10*3/uL (ref 4.0–10.5)

## 2016-06-25 LAB — CULTURE, BLOOD (ROUTINE X 2)
Culture: NO GROWTH
Culture: NO GROWTH

## 2016-06-25 LAB — RENAL FUNCTION PANEL
ALBUMIN: 3.3 g/dL — AB (ref 3.5–5.0)
Anion gap: 11 (ref 5–15)
BUN: 23 mg/dL — AB (ref 6–20)
CHLORIDE: 100 mmol/L — AB (ref 101–111)
CO2: 27 mmol/L (ref 22–32)
CREATININE: 5 mg/dL — AB (ref 0.44–1.00)
Calcium: 9.9 mg/dL (ref 8.9–10.3)
GFR calc Af Amer: 12 mL/min — ABNORMAL LOW (ref >60)
GFR, EST NON AFRICAN AMERICAN: 10 mL/min — AB (ref >60)
Glucose, Bld: 94 mg/dL (ref 65–99)
PHOSPHORUS: 5.2 mg/dL — AB (ref 2.5–4.6)
Potassium: 5.1 mmol/L (ref 3.5–5.1)
Sodium: 138 mmol/L (ref 135–145)

## 2016-06-25 SURGERY — AMPUTATION DIGIT
Anesthesia: Monitor Anesthesia Care | Site: Toe | Laterality: Right

## 2016-06-25 MED ORDER — ONDANSETRON HCL 4 MG PO TABS
4.0000 mg | ORAL_TABLET | Freq: Four times a day (QID) | ORAL | Status: DC | PRN
  Administered 2016-06-25: 4 mg via ORAL

## 2016-06-25 MED ORDER — CARVEDILOL 25 MG PO TABS: 25.0000 mg | ORAL_TABLET | Freq: Once | ORAL | Status: DC

## 2016-06-25 MED ORDER — HEPARIN SODIUM (PORCINE) 1000 UNIT/ML DIALYSIS: 1000.0000 [IU] | INTRAMUSCULAR | Status: DC | PRN

## 2016-06-25 MED ORDER — MIDAZOLAM HCL 2 MG/2ML IJ SOLN
INTRAMUSCULAR | Status: AC
  Filled 2016-06-25: qty 2

## 2016-06-25 MED ORDER — KETAMINE HCL-SODIUM CHLORIDE 100-0.9 MG/10ML-% IV SOSY
PREFILLED_SYRINGE | INTRAVENOUS | Status: AC
  Filled 2016-06-25: qty 10

## 2016-06-25 MED ORDER — LIDOCAINE HCL 1 % IJ SOLN
INTRAMUSCULAR | Status: DC | PRN
  Administered 2016-06-25: 10 mL

## 2016-06-25 MED ORDER — OXYCODONE HCL 5 MG PO TABS
5.0000 mg | ORAL_TABLET | ORAL | Status: DC | PRN
  Administered 2016-06-26 (×2): 10 mg via ORAL
  Filled 2016-06-25: qty 2

## 2016-06-25 MED ORDER — INSULIN GLARGINE 100 UNIT/ML ~~LOC~~ SOLN
12.0000 [IU] | Freq: Every day | SUBCUTANEOUS | Status: DC
  Filled 2016-06-25 (×2): qty 0.12

## 2016-06-25 MED ORDER — MIDAZOLAM HCL 5 MG/5ML IJ SOLN
INTRAMUSCULAR | Status: DC | PRN
  Administered 2016-06-25 (×2): 1 mg via INTRAVENOUS

## 2016-06-25 MED ORDER — HYDROMORPHONE HCL 1 MG/ML IJ SOLN
1.0000 mg | INTRAMUSCULAR | Status: DC | PRN
  Administered 2016-06-26: 1 mg via INTRAVENOUS
  Filled 2016-06-25: qty 1

## 2016-06-25 MED ORDER — FENTANYL CITRATE (PF) 100 MCG/2ML IJ SOLN
INTRAMUSCULAR | Status: AC
  Filled 2016-06-25: qty 2

## 2016-06-25 MED ORDER — ALTEPLASE 2 MG IJ SOLR: 2.0000 mg | Freq: Once | INTRAMUSCULAR | Status: DC | PRN

## 2016-06-25 MED ORDER — FENTANYL CITRATE (PF) 100 MCG/2ML IJ SOLN
INTRAMUSCULAR | Status: DC | PRN
  Administered 2016-06-25 (×2): 50 ug via INTRAVENOUS

## 2016-06-25 MED ORDER — ONDANSETRON HCL 4 MG/2ML IJ SOLN: 4.0000 mg | Freq: Four times a day (QID) | INTRAMUSCULAR | Status: DC | PRN

## 2016-06-25 MED ORDER — METHOCARBAMOL 500 MG PO TABS: 500.0000 mg | ORAL_TABLET | Freq: Four times a day (QID) | ORAL | Status: DC | PRN

## 2016-06-25 MED ORDER — METOCLOPRAMIDE HCL 5 MG PO TABS: 5.0000 mg | ORAL_TABLET | Freq: Three times a day (TID) | ORAL | Status: DC | PRN

## 2016-06-25 MED ORDER — METOCLOPRAMIDE HCL 5 MG/ML IJ SOLN
5.0000 mg | Freq: Three times a day (TID) | INTRAMUSCULAR | Status: DC | PRN
Start: 2016-06-25 — End: 2016-06-27

## 2016-06-25 MED ORDER — DARBEPOETIN ALFA 200 MCG/0.4ML IJ SOSY
200.0000 ug | PREFILLED_SYRINGE | INTRAMUSCULAR | Status: DC
Start: 2016-06-26 — End: 2016-06-27
  Administered 2016-06-26: 200 ug via INTRAVENOUS
  Filled 2016-06-25: qty 0.4

## 2016-06-25 MED ORDER — CHLORHEXIDINE GLUCONATE 4 % EX LIQD
60.0000 mL | Freq: Once | CUTANEOUS | Status: AC
  Administered 2016-06-25: 4 via TOPICAL
  Filled 2016-06-25: qty 60

## 2016-06-25 MED ORDER — PENTAFLUOROPROP-TETRAFLUOROETH EX AERO: 1.0000 "application " | INHALATION_SPRAY | CUTANEOUS | Status: DC | PRN

## 2016-06-25 MED ORDER — METHOCARBAMOL 1000 MG/10ML IJ SOLN
500.0000 mg | Freq: Four times a day (QID) | INTRAMUSCULAR | Status: DC | PRN
  Filled 2016-06-25: qty 5

## 2016-06-25 MED ORDER — LIDOCAINE HCL (PF) 1 % IJ SOLN
INTRAMUSCULAR | Status: AC
  Filled 2016-06-25: qty 30

## 2016-06-25 MED ORDER — LIDOCAINE HCL (PF) 1 % IJ SOLN: 5.0000 mL | INTRAMUSCULAR | Status: DC | PRN

## 2016-06-25 MED ORDER — ACETAMINOPHEN 325 MG PO TABS: 650.0000 mg | ORAL_TABLET | Freq: Four times a day (QID) | ORAL | Status: DC | PRN

## 2016-06-25 MED ORDER — LIDOCAINE-PRILOCAINE 2.5-2.5 % EX CREA
1.0000 "application " | TOPICAL_CREAM | CUTANEOUS | Status: DC | PRN
  Filled 2016-06-25: qty 5

## 2016-06-25 MED ORDER — SODIUM CHLORIDE 0.9 % IV SOLN
INTRAVENOUS | Status: DC
  Administered 2016-06-25: 13:00:00 via INTRAVENOUS

## 2016-06-25 MED ORDER — ACETAMINOPHEN 650 MG RE SUPP
650.0000 mg | Freq: Four times a day (QID) | RECTAL | Status: DC | PRN
Start: 2016-06-25 — End: 2016-06-27

## 2016-06-25 MED ORDER — SODIUM CHLORIDE 0.9 % IV SOLN: 100.0000 mL | INTRAVENOUS | Status: DC | PRN

## 2016-06-25 MED ORDER — HEPARIN SODIUM (PORCINE) 1000 UNIT/ML DIALYSIS
5000.0000 [IU] | INTRAMUSCULAR | Status: DC | PRN
  Filled 2016-06-25: qty 5

## 2016-06-25 MED ORDER — ROPIVACAINE HCL 5 MG/ML IJ SOLN
INTRAMUSCULAR | Status: DC | PRN
  Administered 2016-06-25: 40 mL via PERINEURAL

## 2016-06-25 MED ORDER — 0.9 % SODIUM CHLORIDE (POUR BTL) OPTIME
TOPICAL | Status: DC | PRN
  Administered 2016-06-25: 300 mL

## 2016-06-25 MED ORDER — KETAMINE HCL 10 MG/ML IJ SOLN
INTRAMUSCULAR | Status: DC | PRN
  Administered 2016-06-25: 50 mg via INTRAVENOUS
  Administered 2016-06-25: 20 mg via INTRAVENOUS

## 2016-06-25 MED ORDER — SODIUM CHLORIDE 0.9 % IV SOLN
INTRAVENOUS | Status: DC
Start: 2016-06-25 — End: 2016-06-27
  Administered 2016-06-25 (×2): via INTRAVENOUS

## 2016-06-25 SURGICAL SUPPLY — 31 items
BLADE SURG 21 STRL SS (BLADE) ×3 IMPLANT
BNDG COHESIVE 3X5 TAN STRL LF (GAUZE/BANDAGES/DRESSINGS) ×3 IMPLANT
BNDG COHESIVE 4X5 TAN STRL (GAUZE/BANDAGES/DRESSINGS) ×3 IMPLANT
BNDG ESMARK 4X9 LF (GAUZE/BANDAGES/DRESSINGS) IMPLANT
BNDG GAUZE ELAST 4 BULKY (GAUZE/BANDAGES/DRESSINGS) ×3 IMPLANT
COVER SURGICAL LIGHT HANDLE (MISCELLANEOUS) ×6 IMPLANT
DRAPE U-SHAPE 47X51 STRL (DRAPES) ×3 IMPLANT
DRSG ADAPTIC 3X8 NADH LF (GAUZE/BANDAGES/DRESSINGS) ×3 IMPLANT
DRSG PAD ABDOMINAL 8X10 ST (GAUZE/BANDAGES/DRESSINGS) ×3 IMPLANT
DURAPREP 26ML APPLICATOR (WOUND CARE) ×3 IMPLANT
ELECT REM PT RETURN 9FT ADLT (ELECTROSURGICAL) ×3
ELECTRODE REM PT RTRN 9FT ADLT (ELECTROSURGICAL) ×1 IMPLANT
GAUZE SPONGE 4X4 12PLY STRL (GAUZE/BANDAGES/DRESSINGS) ×3 IMPLANT
GLOVE BIOGEL PI IND STRL 9 (GLOVE) ×1 IMPLANT
GLOVE BIOGEL PI INDICATOR 9 (GLOVE) ×2
GLOVE SURG ORTHO 9.0 STRL STRW (GLOVE) ×3 IMPLANT
GOWN STRL REUS W/ TWL XL LVL3 (GOWN DISPOSABLE) ×2 IMPLANT
GOWN STRL REUS W/TWL XL LVL3 (GOWN DISPOSABLE) ×4
KIT BASIN OR (CUSTOM PROCEDURE TRAY) ×3 IMPLANT
KIT ROOM TURNOVER OR (KITS) ×3 IMPLANT
MANIFOLD NEPTUNE II (INSTRUMENTS) ×3 IMPLANT
NEEDLE 22X1 1/2 (OR ONLY) (NEEDLE) IMPLANT
NS IRRIG 1000ML POUR BTL (IV SOLUTION) ×3 IMPLANT
PACK ORTHO EXTREMITY (CUSTOM PROCEDURE TRAY) ×3 IMPLANT
PAD ARMBOARD 7.5X6 YLW CONV (MISCELLANEOUS) ×6 IMPLANT
SUCTION FRAZIER HANDLE 10FR (MISCELLANEOUS)
SUCTION TUBE FRAZIER 10FR DISP (MISCELLANEOUS) IMPLANT
SUT ETHILON 2 0 PSLX (SUTURE) ×6 IMPLANT
SYR CONTROL 10ML LL (SYRINGE) ×3 IMPLANT
TOWEL OR 17X24 6PK STRL BLUE (TOWEL DISPOSABLE) ×3 IMPLANT
TOWEL OR 17X26 10 PK STRL BLUE (TOWEL DISPOSABLE) ×3 IMPLANT

## 2016-06-25 NOTE — H&P (View-Only) (Signed)
Patient ID: [name], female   DOB: 05/31/1981, 34 y.o.   MRN: [id number] Plan for dialysis today and amputation of right great toe tomorrow Wednesday. Patient is in agreement to proceed with surgery all questions were answered. 

## 2016-06-25 NOTE — Op Note (Signed)
06/20/2016 - 06/25/2016  12:30 PM  PATIENT:  [name]    PRE-OPERATIVE DIAGNOSIS:  Gangrene, Osteomyelitis Right Great Toe  POST-OPERATIVE DIAGNOSIS:  Same  PROCEDURE:  Amputation Right Great and partial amputation of the first metatarsal including the sesamoids  SURGEON:  Nadara Mustard, MD  PHYSICIAN ASSISTANT:None ANESTHESIA:   General  PREOPERATIVE INDICATIONS:  [name] is a  35 y.o. female with a diagnosis of Gangrene, Osteomyelitis Right Great Toe who failed conservative measures and elected for surgical management.    The risks benefits and alternatives were discussed with the patient preoperatively including but not limited to the risks of infection, bleeding, nerve injury, cardiopulmonary complications, the need for revision surgery, among others, and the patient was willing to proceed.  OPERATIVE IMPLANTS: None  OPERATIVE FINDINGS: Patient's skin edges were still ischemic and nonviable at the amputation site of the MTP joint amputation which necessitated amputating through the first metatarsal neck and excision of the sesamoids to allow for wound closure.  OPERATIVE PROCEDURE: He was brought to the operating room after undergoing a popliteal block. After adequate levels anesthesia were obtained patient's right lower extremity was prepped using DuraPrep draped into a sterile field patient had a little bit of sensation and she received reinforcement with a digital block with 10 mL 1% lidocaine plain. A timeout was called. A fishmouth incision was first made just distal to the MTP joint. The toe was amputated through the MTP joint the wound edges were ischemic gangrenous and nonviable. This necessitated amputation at the metatarsal neck with partial amputation of first ray and amputation of the sesamoids. This provided skin margins which were healthy viable and bleeding. The wound was closed using 2-0 nylon a sterile compressive dressing was applied patient was taken to the PACU in  stable condition.

## 2016-06-25 NOTE — Anesthesia Preprocedure Evaluation (Addendum)
Anesthesia Evaluation  Patient identified by MRN, date of birth, ID band Patient awake    Reviewed: Allergy & Precautions, NPO status , Patient's Chart, lab work & pertinent test results, reviewed documented beta blocker date and time   Airway Mallampati: II  TM Distance: >3 FB Neck ROM: Full    Dental  (+) Teeth Intact, Dental Advisory Given   Pulmonary asthma ,    Pulmonary exam normal breath sounds clear to auscultation       Cardiovascular hypertension, Pt. on home beta blockers + CAD and + Past MI  Normal cardiovascular exam Rhythm:Regular Rate:Normal     Neuro/Psych  Headaches, negative psych ROS   GI/Hepatic negative GI ROS, Neg liver ROS,   Endo/Other  diabetes, Poorly Controlled, Type 1, Insulin DependentMorbid obesity  Renal/GU ESRF and DialysisRenal disease     Musculoskeletal  (+) Arthritis , osteomyelitis   Abdominal   Peds  Hematology  (+) Blood dyscrasia, anemia ,   Anesthesia Other Findings Day of surgery medications reviewed with the patient.  Reproductive/Obstetrics                            Anesthesia Physical Anesthesia Plan  ASA: III  Anesthesia Plan: Regional and MAC   Post-op Pain Management:  Regional for Post-op pain   Induction: Intravenous  Airway Management Planned: Nasal Cannula  Additional Equipment:   Intra-op Plan:   Post-operative Plan:   Informed Consent: I have reviewed the patients History and Physical, chart, labs and discussed the procedure including the risks, benefits and alternatives for the proposed anesthesia with the patient or authorized representative who has indicated his/her understanding and acceptance.   Dental advisory given  Plan Discussed with:   Anesthesia Plan Comments: (Risks/benefits of regional block discussed with patient including risk of bleeding, infection, nerve damage, and possibility of failed block.  Also  discussed backup plan of general anesthesia and associated risks.  Patient wishes to proceed.)        Anesthesia Quick Evaluation

## 2016-06-25 NOTE — Anesthesia Postprocedure Evaluation (Signed)
Anesthesia Post Note  Patient: [name]  Procedure(s) Performed: Procedure(s) (LRB): Amputation Right Great Toe at the Metatarsophalangeal Joint (Right)  Patient location during evaluation: PACU Anesthesia Type: MAC and Regional Level of consciousness: awake and alert Pain management: pain level controlled Vital Signs Assessment: post-procedure vital signs reviewed and stable Respiratory status: spontaneous breathing, nonlabored ventilation, respiratory function stable and patient connected to nasal cannula oxygen Cardiovascular status: stable and blood pressure returned to baseline Anesthetic complications: no    Last Vitals:  Vitals:   06/25/16 1310 06/25/16 1324  BP: (!) 149/80 (!) 151/84  Pulse: 87 88  Resp: 19 16  Temp:  36.4 C    Last Pain:  Vitals:   06/25/16 1324  TempSrc:   PainSc: Asleep                 Cecile Hearing

## 2016-06-25 NOTE — Interval H&P Note (Signed)
History and Physical Interval Note:  06/25/2016 6:46 AM  [name]  has presented today for surgery, with the diagnosis of Gangrene, Osteomyelitis Right Great Toe  The various methods of treatment have been discussed with the patient and family. After consideration of risks, benefits and other options for treatment, the patient has consented to  Procedure(s): Amputation Right Great Toe at the Metatarsophalangeal Joint (Right) as a surgical intervention .  The patient's history has been reviewed, patient examined, no change in status, stable for surgery.  I have reviewed the patient's chart and labs.  Questions were answered to the patient's satisfaction.     Nadara Mustard

## 2016-06-25 NOTE — Progress Notes (Signed)
Called short stay to give report, pt notified OR is coming up to take her to surgery

## 2016-06-25 NOTE — Evaluation (Signed)
Physical Therapy Evaluation Patient Details Name: [name] MRN: [id number] DOB: [birth year] Today's Date: 06/25/2016   History of Present Illness  Pt is a 35 y/o female s/p R great toe and partial 1st metatarsal amputation. PMH including but not limited to DM, ESRD, hx of MI, COPD, CAD and HTN.  Clinical Impression  Pt presented supine in bed with HOB elevated, awake and willing to participate in therapy session. Prior to admission, pt stated that she was independent with all functional mobility. Pt was very limited during evaluation secondary to nausea with movement and multiple bouts of emesis. Pt's nurse was notified. Pt would continue to benefit from skilled physical therapy services at this time while admitted and after d/c to address her below listed limitations in order to improve her overall safety and independence with functional mobility. PT planning to stair train at next session. PT and pt discussed ascending and descending stairs in seated position as likely the safest option at this time.     Follow Up Recommendations Home health PT;Supervision/Assistance - 24 hour    Equipment Recommendations  Rolling walker with 5" wheels;3in1 (PT);Wheelchair (measurements PT);Wheelchair cushion (measurements PT);Other (comment) (w/c with elevating leg rests)    Recommendations for Other Services       Precautions / Restrictions Precautions Precautions: Fall Restrictions Weight Bearing Restrictions: Yes RLE Weight Bearing: Non weight bearing Other Position/Activity Restrictions: post-op shoe      Mobility  Bed Mobility Overal bed mobility: Needs Assistance Bed Mobility: Supine to Sit     Supine to sit: Supervision;HOB elevated     General bed mobility comments: pt required increased time to complete (secondary to nausea and emesis)  Transfers Overall transfer level: Needs assistance Equipment used: Rolling walker (2 wheeled) Transfers: Sit to/from [employer] CorporationStand;Stand Pivot  Transfers Sit to Stand: Min guard Stand pivot transfers: Min assist       General transfer comment: pt required increased time, VC'ing for bilateral hand placement and min A with SPT for safety as pt demonstrated mild instability requiring min A to maintain upright standing balance. Pt able to maintain NWB R LE during transfers.  Ambulation/Gait                Stairs            Wheelchair Mobility    Modified Rankin (Stroke Patients Only)       Balance Overall balance assessment: Needs assistance Sitting-balance support: Feet supported;No upper extremity supported Sitting balance-Leahy Scale: Good     Standing balance support: During functional activity;Bilateral upper extremity supported Standing balance-Leahy Scale: Poor Standing balance comment: pt reliant on bilateral UEs on RW                             Pertinent Vitals/Pain Pain Assessment: No/denies pain    Home Living Family/patient expects to be discharged to:: Private residence Living Arrangements: Spouse/significant other;Children;Parent Available Help at Discharge: Family;Available 24 hours/day Type of Home: Apartment Home Access: Stairs to enter Entrance Stairs-Rails: Right Entrance Stairs-Number of Steps: full flight Home Layout: One level Home Equipment: None      Prior Function Level of Independence: Independent               Hand Dominance        Extremity/Trunk Assessment   Upper Extremity Assessment: Overall WFL for tasks assessed           Lower Extremity Assessment: RLE deficits/detail RLE Deficits / Details:  Pt with decreased strength and impaired sensation secondary to post-op.     Cervical / Trunk Assessment: Normal  Communication   Communication: No difficulties  Cognition Arousal/Alertness: Awake/alert Behavior During Therapy: WFL for tasks assessed/performed Overall Cognitive Status: Within Functional Limits for tasks assessed                       General Comments      Exercises     Assessment/Plan    PT Assessment Patient needs continued PT services  PT Problem List Decreased strength;Decreased range of motion;Decreased activity tolerance;Decreased balance;Decreased mobility;Decreased coordination;Decreased knowledge of use of DME          PT Treatment Interventions DME instruction;Gait training;Stair training;Functional mobility training;Therapeutic activities;Balance training;Neuromuscular re-education;Therapeutic exercise;Patient/family education    PT Goals (Current goals can be found in the Care Plan section)  Acute Rehab PT Goals Patient Stated Goal: return home PT Goal Formulation: With patient Time For Goal Achievement: 07/02/16 Potential to Achieve Goals: Good    Frequency Min 5X/week   Barriers to discharge        Co-evaluation               End of Session Equipment Utilized During Treatment: Gait belt;Other (comment) (post-op shoe on R LE) Activity Tolerance: Other (comment) (pt limited secondary to nausea and emesis (multiple bouts)) Patient left: in chair;with call bell/phone within reach Nurse Communication: Mobility status         Time: 3825-0539 PT Time Calculation (min) (ACUTE ONLY): 44 min   Charges:   PT Evaluation $PT Eval Moderate Complexity: 1 Procedure PT Treatments $Therapeutic Activity: 23-37 mins   PT G CodesAlessandra Bevels Kendarious Gudino 06/25/2016, 5:42 PM Deborah Chalk, PT, DPT [phone]

## 2016-06-25 NOTE — Progress Notes (Signed)
Orthopedic Tech Progress Note Patient Details:  [name] 1981-09-29 [id number]  Ortho Devices Type of Ortho Device: Postop shoe/boot Ortho Device/Splint Location: rle Ortho Device/Splint Interventions: Application   Nikki Dom 06/25/2016, 4:13 PM

## 2016-06-25 NOTE — Progress Notes (Addendum)
PROGRESS NOTE    [name]  JYN:[id number] DOB: Feb 24, 1981 DOA: 06/20/2016 PCP: Elizabeth PalauANDERSON,TERESA, FNP     Brief Narrative:  35 yo BF PMHx Migraine, DM Type 1 controlled with complications,COPD, HLD, HTN,NSTEMI, Chronic Systolic and Diastolic CHF, ESRD on HD T/Th/Sat,.    Presented to the ED with progressively worsening of R great toe ulcer an infection, which failed abx therapy x 2 and had a procedure done by podiatry.  Admitted for infected wound and cellulitis of the right great toe. Placed on IV abx, ID consulted, MRI ordered suspicious for osteo. Orthopedic consulted. Patient had an ABI that showed no compressible vessels. Necrosis of the tip of the toe, ortho recommended amputation at the MTP joint.    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  - Continue Abx per ID  Total days of antibiotics: 4 vanco/ceftaz - Ortho consulted; recommendation is for amputation of the R great toe on Wednesday - F/u blood cultures no growth UTD Would give her 2 weeks of ceftaz/vanco with HD afterwards,End date 07-08-16  DM type 1 uncontrolled with, complications - 9/22 Hemoglobin A1c = 5.1   - Lantus 12 units daily, dose reduced due to low cbg this am  -NovoLog 3 units QAC -Sensitive SSI  Morbid Obesity - Weight loss discussed  - Dietary consult  Chronic Systolic and Diastolic CHF -10/23/2015 echocardiogram EF 35-40%, grade 2 diastolic  dysfunction with moderate pericardial effusion -Obtain repeat echocardiogram prior to surgery  Hypertension   - Coreg 25 mg BID  -Imdur 20 mg TID  ESRD (end stage renal disease) on HD T/Th/Sat, - Renal consulted for routine dialysis.TTS    DVT prophylaxis: Subcutaneous heparin Code Status: Full Family Communication: None Disposition Plan: dc per ortho    Consultants:  Dr.Jeffrey C Hatcher ID Dr.Elizabeth Signe [facility] Dr.Marcus Kandis MannanV Duda Orthopedic surgery   Procedures/Significant Events:  9/24 MRI right toe,:-Ulceration  distal great toe. Mild marrow edema in the tuft of the distal phalanx is worrisome for osteomyelitis    Cultures 9/22 blood right forearm/hand NGTD 9/22 MRSA by PCR negative   Antimicrobials: Fortaz 9/23>> Ceftriaxone 9/22>> 9/23 Flagyl 9/22>> 9/23 Vancomycin 9/22>>   Devices None   LINES / TUBES:  None    Continuous Infusions:    Objective: Vitals:   06/24/16 1326 06/24/16 2106 06/25/16 0526 06/25/16 1106  BP: (!) 153/88 (!) 139/99 (!) 141/85 (!) 154/103  Pulse: 95 98 87 89  Resp: 14 15 15 16   Temp: 97.9 F (36.6 C)  98.2 F (36.8 C) 97.6 F (36.4 C)  TempSrc: Oral  Oral Oral  SpO2: 96% 98% 98%   Weight:      Height:        Intake/Output Summary (Last 24 hours) at 06/25/16 1117 Last data filed at 06/25/16 0900  Gross per 24 hour  Intake              490 ml  Output             1661 ml  Net            -1171 ml   Filed Weights   06/21/16 1850 06/24/16 0826 06/24/16 1250  Weight: 122.5 kg (270 lb 1 oz) 123.7 kg (272 lb 11.3 oz) 121.7 kg (268 lb 4.8 oz)    Examination:  General: A/O 4, NAD, No acute respiratory distress Eyes: negative scleral hemorrhage, negative anisocoria, negative icterus ENT: Negative Runny nose, negative gingival bleeding, Neck:  Negative scars, masses, torticollis, lymphadenopathy, JVD Lungs: Clear to auscultation bilaterally without wheezes or crackles Cardiovascular: Regular rate and  rhythm without murmur gallop or rub normal S1 and S2 Abdomen: MORBIDLY OBESE, negative abdominal pain, nondistended, positive soft, bowel sounds, no rebound, no ascites, no appreciable mass Extremities: positive right great toe cyanosis with swelling (did not take bandage down). DP/PT pulse 2+ bilateral  Skin: Negative rashes, lesions, ulcers Psychiatric:  Negative depression, negative anxiety, negative fatigue, negative mania  Central nervous system:  Cranial nerves II through XII intact, tongue/uvula midline, all extremities muscle strength 5/5, sensation intact throughout, negative dysarthria, negative expressive aphasia, negative receptive aphasia.  .     Data Reviewed: Care during the described time interval was provided by me .  I have reviewed this patient's available data, including medical history, events of note, physical examination, and all test results as part of my evaluation. I have personally reviewed and interpreted all radiology studies.  BC:  Recent Labs Lab 06/20/16 0147 06/20/16 0205 06/23/16 0502 06/24/16 0842  WBC 5.2  --  5.3 5.0  NEUTROABS 3.5  --  3.3  --   HGB 10.2* 12.9 9.6* 9.3*  HCT 33.9* 38.0 32.4* 31.2*  MCV 104.6*  --  105.2* 105.1*  PLT 213  --  201 214   Basic Metabolic Panel:  Recent Labs Lab 06/20/16 0205 06/21/16 1720 06/23/16 0502 06/24/16 0831  NA 135 138 136 138  K 3.8 4.2 4.4 4.8  CL 92* 99* 96* 98*  CO2  --  27 28 27   GLUCOSE 193* 114* 117* 73  BUN 26* 25* 31* 40*  CREATININE 3.70* 3.69* 5.21* 6.54*  CALCIUM  --  9.2 9.6 9.9  MG  --   --   --  2.5*  PHOS  --  3.9  --   --    GFR: Estimated Creatinine Clearance: 16.3 mL/min (by C-G formula based on SCr of 6.54 mg/dL (H)). Liver Function Tests:  Recent Labs Lab 06/21/16 1720 06/24/16 0831  AST  --  22  ALT  --  24  ALKPHOS  --  141*  BILITOT  --  1.7*  PROT  --  6.3*  ALBUMIN 3.3* 3.1*   No results for input(s): LIPASE, AMYLASE in the last 168 hours. No results  for input(s): AMMONIA in the last 168 hours. Coagulation Profile: No results for input(s): INR, PROTIME in the last 168 hours. Cardiac Enzymes: No results for input(s): CKTOTAL, CKMB, CKMBINDEX, TROPONINI in the last 168 hours. BNP (last 3 results) No results for input(s): PROBNP in the last 8760 hours. HbA1C: No results for input(s): HGBA1C in the last 72 hours. CBG:  Recent Labs Lab 06/24/16 0623 06/24/16 1632 06/24/16 2141 06/25/16 0621 06/25/16 1104  GLUCAP 77 119* 157* 72 87   Lipid Profile: No results for input(s): CHOL, HDL, LDLCALC, TRIG, CHOLHDL, LDLDIRECT in the last 72 hours. Thyroid Function Tests: No results for input(s): TSH, T4TOTAL, FREET4, T3FREE,  THYROIDAB in the last 72 hours. Anemia Panel: No results for input(s): VITAMINB12, FOLATE, FERRITIN, TIBC, IRON, RETICCTPCT in the last 72 hours. Sepsis Labs: No results for input(s): PROCALCITON, LATICACIDVEN in the last 168 hours.  Recent Results (from the past 240 hour(s))  Blood Cultures x 2 sites     Status: None (Preliminary result)   Collection Time: 06/20/16  4:25 AM  Result Value Ref Range Status   Specimen Description BLOOD RIGHT FOREARM  Final   Special Requests BOTTLES DRAWN AEROBIC AND ANAEROBIC  Final   Culture NO GROWTH 4 DAYS  Final   Report Status PENDING  Incomplete  Blood Cultures x 2 sites     Status: None (Preliminary result)   Collection Time: 06/20/16  6:30 AM  Result Value Ref Range Status   Specimen Description BLOOD RIGHT HAND  Final   Special Requests IN PEDIATRIC BOTTLE 3CC  Final   Culture NO GROWTH 4 DAYS  Final   Report Status PENDING  Incomplete  MRSA PCR Screening     Status: None   Collection Time: 06/20/16  6:31 AM  Result Value Ref Range Status   MRSA by PCR NEGATIVE NEGATIVE Final    Comment:        The GeneXpert MRSA Assay (FDA approved for NASAL specimens only), is one component of a comprehensive MRSA colonization surveillance program. It is not intended to  diagnose MRSA infection nor to guide or monitor treatment for MRSA infections.          Radiology Studies: No results found.      Scheduled Meds: . atorvastatin  40 mg Oral QHS  . calcitRIOL  1 mcg Oral Q T,Th,Sa-HD  . carvedilol  25 mg Oral BID WC  .  ceFAZolin (ANCEF) IV  3 g Intravenous On Call to OR  . cefTAZidime (FORTAZ)  IV  2 g Intravenous Q T,Th,Sat-1800  . feeding supplement (NEPRO CARB STEADY)  237 mL Oral QPC lunch  . heparin  5,000 Units Subcutaneous Q8H  . hydrOXYzine  25 mg Oral Q6H  . insulin aspart  0-9 Units Subcutaneous TID WC  . insulin aspart  3 Units Subcutaneous TID WC  . insulin glargine  16 Units Subcutaneous QHS  . isosorbide dinitrate  20 mg Oral TID  . mupirocin cream   Topical Daily  . sevelamer carbonate  3,200 mg Oral TID WC  . vancomycin  1,000 mg Intravenous Q T,Th,Sa-HD   Continuous Infusions:    LOS: 5 days    Time spent:40 min    Richarda Overlie, MD [facility] Pager [phone]  If 7PM-7AM, please contact night-coverage [email or web address] Password [facility] 06/25/2016, 11:17 AM

## 2016-06-25 NOTE — Progress Notes (Signed)
[facility] Progress Note   Dialysis Orders: TTS [facility] 4.25 hours 200 Optiflux 450/800 EDW 123.5 2 K 2 Ca heparin 12K left upper AVF calcitriol 1 venofer 50 q thur and Aranesp 200 q Thursday - both last given 9/21 Recent labs: hgb 9.6 iPTH 90- calcitriol just lowered Ca/P ok hgb A1c 5.5 July  Assessment/Plan: 1. Right great toe Osteomyelitis - s/p amputation of R great toe 9/27- Vanc and Fortaz per ID 2. ESRD -  TTS K 4.2 Sat pre HD - no issues with treatment- next HD Tuesday 9/28 - prob lower edw at d/c, will get standing post-wt if able tomorrow. 3. Hypertension/volume  - gets to EDW/gains variable; net UF 4.6 Saturday with post weight 122.5. BP 130 - 140s during HD treatment. EDW can maybe come down a little more..  As above 4. Anemia  - hgb per CBC 10.2 - stable on weekly ESA and Fe 5. Metabolic bone disease -  Ca/P ok- on VDRA/ binders 6. Nutrition - alb 3.3 VERY unhappy with restricted diet.  Labs/good diabetes control indicate that she is able to make appropriate selections in food choices as an outpatient.  Have liberalized diet to regular to provide her more variety in selections 7. DM - per primary - BS ok hgb A1c 5.1   Bufford Buttner MD [facility] pager [phone]    cell [phone] 06/25/2016, 7:02 PM    Subjective:   S/p R great toe amputation.  Objective Vitals:   06/25/16 1305 06/25/16 1310 06/25/16 1324 06/25/16 1459  BP:  (!) 149/80 (!) 151/84 (!) 148/58  Pulse: 88 87 88 85  Resp: 16 19 16 18   Temp:   97.6 F (36.4 C) 97.6 F (36.4 C)  TempSrc:    Oral  SpO2: 100% 100% 100% 98%  Weight:      Height:       Physical Exam General: NAD Heart: RRR Lungs: no rales Abdomen: obese/large pannus, soft NT Extremities: maybe tr LE edema, R foot dressed with boot in place Dialysis Access:  Left AVF + bruit   Additional Objective Labs: Basic Metabolic Panel:  Recent Labs Lab 06/21/16 1720 06/23/16 0502 06/24/16 0831  NA 138  136 138  K 4.2 4.4 4.8  CL 99* 96* 98*  CO2 27 28 27   GLUCOSE 114* 117* 73  BUN 25* 31* 40*  CREATININE 3.69* 5.21* 6.54*  CALCIUM 9.2 9.6 9.9  PHOS 3.9  --   --    Liver Function Tests:  Recent Labs Lab 06/21/16 1720 06/24/16 0831  AST  --  22  ALT  --  24  ALKPHOS  --  141*  BILITOT  --  1.7*  PROT  --  6.3*  ALBUMIN 3.3* 3.1*  CBC:  Recent Labs Lab 06/20/16 0147 06/20/16 0205 06/23/16 0502 06/24/16 0842  WBC 5.2  --  5.3 5.0  NEUTROABS 3.5  --  3.3  --   HGB 10.2* 12.9 9.6* 9.3*  HCT 33.9* 38.0 32.4* 31.2*  MCV 104.6*  --  105.2* 105.1*  PLT 213  --  201 214   Blood Culture    Component Value Date/Time   SDES BLOOD RIGHT HAND 06/20/2016 0630   SPECREQUEST IN PEDIATRIC BOTTLE 3CC 06/20/2016 0630   CULT NO GROWTH 5 DAYS 06/20/2016 0630   REPTSTATUS 06/25/2016 FINAL 06/20/2016 0630    CBG:  Recent Labs Lab 06/24/16 2141 06/25/16 0621 06/25/16 1104 06/25/16 1244 06/25/16 1457  GLUCAP 157* 72 87 99 81  Studies/Results: No results found. Medications: . sodium chloride 10 mL/hr at 06/25/16 1201  . sodium chloride 10 mL/hr at 06/25/16 1245   . atorvastatin  40 mg Oral QHS  . calcitRIOL  1 mcg Oral Q T,Th,Sa-HD  . carvedilol  25 mg Oral BID WC  . cefTAZidime (FORTAZ)  IV  2 g Intravenous Q T,Th,Sat-1800  . [START ON 06/26/2016] darbepoetin (ARANESP) injection - DIALYSIS  200 mcg Intravenous Q Thu-HD  . feeding supplement (NEPRO CARB STEADY)  237 mL Oral QPC lunch  . heparin  5,000 Units Subcutaneous Q8H  . hydrOXYzine  25 mg Oral Q6H  . insulin aspart  0-9 Units Subcutaneous TID WC  . insulin aspart  3 Units Subcutaneous TID WC  . insulin glargine  12 Units Subcutaneous QHS  . isosorbide dinitrate  20 mg Oral TID  . mupirocin cream   Topical Daily  . sevelamer carbonate  3,200 mg Oral TID WC  . vancomycin  1,000 mg Intravenous Q T,Th,Sa-HD

## 2016-06-25 NOTE — Progress Notes (Signed)
Pt states she can not be pregnant.  Dr Desmond Lope informed states he is ok with not doing a preg test.

## 2016-06-25 NOTE — Transfer of Care (Signed)
Immediate Anesthesia Transfer of Care Note  Patient: [name]  Procedure(s) Performed: Procedure(s): Amputation Right Great Toe at the Metatarsophalangeal Joint (Right)  Patient Location: PACU  Anesthesia Type:MAC and MAC combined with regional for post-op pain  Level of Consciousness: awake, sedated, patient cooperative and responds to stimulation  Airway & Oxygen Therapy: Patient Spontanous Breathing and Patient connected to nasal cannula oxygen  Post-op Assessment: Report given to RN and Post -op Vital signs reviewed and stable  Post vital signs: Reviewed and stable  Last Vitals:  Vitals:   06/25/16 1106 06/25/16 1242  BP: (!) 154/103   Pulse: 89   Resp: 16   Temp: 36.4 C (P) 36.4 C    Last Pain:  Vitals:   06/25/16 1242  TempSrc:   PainSc: (P) 0-No pain      Patients Stated Pain Goal: 2 (06/24/16 1705)  Complications: No apparent anesthesia complications

## 2016-06-25 NOTE — Anesthesia Procedure Notes (Addendum)
Anesthesia Regional Block:  Popliteal block  Pre-Anesthetic Checklist: ,, timeout performed, Correct Patient, Correct Site, Correct Laterality, Correct Procedure, Correct Position, site marked, Risks and benefits discussed,  Surgical consent,  Pre-op evaluation,  At surgeon's request and post-op pain management  Laterality: Right  Prep: chloraprep       Needles:  Injection technique: Single-shot  Needle Type: Echogenic Needle     Needle Length: 9cm 9 cm Needle Gauge: 21 and 21 G    Additional Needles:  Procedures: ultrasound guided (picture in chart) Popliteal block Narrative:  Injection made incrementally with aspirations every 5 mL.  Performed by: Personally  Anesthesiologist: Cecile Hearing  Additional Notes: No pain on injection. No increased resistance to injection. Injection made in 5cc increments.  Good needle visualization.  Patient tolerated procedure well.  Combined popliteal/saphenous nerve block.

## 2016-06-26 ENCOUNTER — Encounter (HOSPITAL_COMMUNITY): Payer: Self-pay | Admitting: Orthopedic Surgery

## 2016-06-26 LAB — COMPREHENSIVE METABOLIC PANEL
ALBUMIN: 3.2 g/dL — AB (ref 3.5–5.0)
ALT: 16 U/L (ref 14–54)
ANION GAP: 11 (ref 5–15)
AST: 21 U/L (ref 15–41)
Alkaline Phosphatase: 137 U/L — ABNORMAL HIGH (ref 38–126)
BILIRUBIN TOTAL: 1.4 mg/dL — AB (ref 0.3–1.2)
BUN: 27 mg/dL — ABNORMAL HIGH (ref 6–20)
CALCIUM: 9.8 mg/dL (ref 8.9–10.3)
CO2: 28 mmol/L (ref 22–32)
Chloride: 98 mmol/L — ABNORMAL LOW (ref 101–111)
Creatinine, Ser: 5.62 mg/dL — ABNORMAL HIGH (ref 0.44–1.00)
GFR calc non Af Amer: 9 mL/min — ABNORMAL LOW (ref >60)
GFR, EST AFRICAN AMERICAN: 10 mL/min — AB (ref >60)
Glucose, Bld: 70 mg/dL (ref 65–99)
POTASSIUM: 5.2 mmol/L — AB (ref 3.5–5.1)
Sodium: 137 mmol/L (ref 135–145)
TOTAL PROTEIN: 6.2 g/dL — AB (ref 6.5–8.1)

## 2016-06-26 LAB — CBC
HEMATOCRIT: 33.8 % — AB (ref 36.0–46.0)
HEMOGLOBIN: 10 g/dL — AB (ref 12.0–15.0)
MCH: 31.6 pg (ref 26.0–34.0)
MCHC: 29.6 g/dL — ABNORMAL LOW (ref 30.0–36.0)
MCV: 107 fL — ABNORMAL HIGH (ref 78.0–100.0)
Platelets: 204 10*3/uL (ref 150–400)
RBC: 3.16 MIL/uL — ABNORMAL LOW (ref 3.87–5.11)
RDW: 17.4 % — ABNORMAL HIGH (ref 11.5–15.5)
WBC: 5.2 10*3/uL (ref 4.0–10.5)

## 2016-06-26 LAB — GLUCOSE, CAPILLARY
GLUCOSE-CAPILLARY: 56 mg/dL — AB (ref 65–99)
GLUCOSE-CAPILLARY: 83 mg/dL (ref 65–99)
Glucose-Capillary: 119 mg/dL — ABNORMAL HIGH (ref 65–99)

## 2016-06-26 LAB — VANCOMYCIN, RANDOM: VANCOMYCIN RM: 13

## 2016-06-26 MED ORDER — OXYCODONE HCL 5 MG PO TABS
ORAL_TABLET | ORAL | Status: AC
  Filled 2016-06-26: qty 2

## 2016-06-26 MED ORDER — HYDROMORPHONE HCL 1 MG/ML IJ SOLN
INTRAMUSCULAR | Status: AC
  Filled 2016-06-26: qty 1

## 2016-06-26 MED ORDER — OXYCODONE HCL 5 MG PO TABS: 5.0000 mg | ORAL_TABLET | Freq: Four times a day (QID) | ORAL | 0 refills | Status: DC | PRN

## 2016-06-26 MED ORDER — SODIUM CHLORIDE 0.9 % IV SOLN
1250.0000 mg | INTRAVENOUS | Status: DC
  Administered 2016-06-26: 1250 mg via INTRAVENOUS
  Filled 2016-06-26: qty 1250

## 2016-06-26 MED ORDER — OXYCODONE HCL 5 MG PO TABS: 5.0000 mg | ORAL_TABLET | ORAL | 0 refills | Status: DC | PRN

## 2016-06-26 MED ORDER — VANCOMYCIN HCL IN DEXTROSE 1-5 GM/200ML-% IV SOLN: 1000.0000 mg | INTRAVENOUS | 0 refills | Status: DC

## 2016-06-26 MED ORDER — DEXTROSE 5 % IV SOLN: 2.0000 g | INTRAVENOUS | 0 refills | Status: AC

## 2016-06-26 MED ORDER — VANCOMYCIN HCL 1000 MG IV SOLR: 1250.0000 mg | INTRAVENOUS | 0 refills | Status: AC

## 2016-06-26 MED ORDER — DARBEPOETIN ALFA 200 MCG/0.4ML IJ SOSY
PREFILLED_SYRINGE | INTRAMUSCULAR | Status: AC
  Administered 2016-06-26: 200 ug via INTRAVENOUS
  Filled 2016-06-26: qty 0.4

## 2016-06-26 MED ORDER — METHOCARBAMOL 500 MG PO TABS: 500.0000 mg | ORAL_TABLET | Freq: Three times a day (TID) | ORAL | 0 refills | Status: DC | PRN

## 2016-06-26 MED ORDER — CALCITRIOL 0.5 MCG PO CAPS
ORAL_CAPSULE | ORAL | Status: AC
  Filled 2016-06-26: qty 2

## 2016-06-26 MED ORDER — INSULIN GLARGINE 100 UNIT/ML SOLOSTAR PEN: 10.0000 [IU] | PEN_INJECTOR | Freq: Every day | SUBCUTANEOUS | 11 refills | Status: DC

## 2016-06-26 NOTE — Progress Notes (Signed)
Spoke with Dr [name] regarding Ms Clugston discharge and 3 in 1 and pain medication states he did not discharge her.  Dr Susie Cassette did called Dr regarding discharge and she stated she would put [facility] and equipment in discharge paper work.  Called case mgmt regarding equipment every thing (walker, BSC were delivered and tomorrow pt will pick up wheel chair at medical store.  Then Called Dr Craige Cotta for pain medication for the pt to take home with left message.  AVS paper work needed to be reprinted with [facility] and medications. Explained to pt and she understood.

## 2016-06-26 NOTE — Progress Notes (Signed)
Physical Therapy Treatment Patient Details Name: [name] MRN: [id number] DOB: [birth year] Today's Date: 06/26/2016    History of Present Illness Pt is a 35 y/o female s/p R great toe and partial 1st metatarsal amputation. PMH including but not limited to DM, ESRD, hx of MI, COPD, CAD and HTN.    PT Comments    Pt presented supine in bed with HOB elevated, awake and willing to participate in therapy session. Pt reported no nausea today; however, she did have dialysis this morning and fatigue was a limiting factor throughout session. Pt making slow progress with gait and ambulation goals. Pt declined stair training at this time and PT verbally discussed ascending and descending stairs in seated position. Pt stated that she feels comfortable with that and has had to ascend and descend stairs in that manner in the past. PT answered all of pt's questions and she stated that she is ready to d/c home today. Pt would continue to benefit from skilled physical therapy services at this time while admitted and after d/c to address her limitations in order to improve her overall safety and independence with functional mobility.   Follow Up Recommendations  Home health PT;Supervision/Assistance - 24 hour     Equipment Recommendations  Rolling walker with 5" wheels;Wheelchair (measurements PT);Wheelchair cushion (measurements PT);Other (comment) (w/c with elevating leg rests; pt refused 3-in-1)    Recommendations for Other Services       Precautions / Restrictions Precautions Precautions: Fall Restrictions Weight Bearing Restrictions: Yes RLE Weight Bearing: Non weight bearing    Mobility  Bed Mobility Overal bed mobility: Needs Assistance Bed Mobility: Supine to Sit     Supine to sit: HOB elevated;Modified independent (Device/Increase time)        Transfers Overall transfer level: Needs assistance Equipment used: Rolling walker (2 wheeled) Transfers: Sit to/from Stand Sit to Stand:  Min guard         General transfer comment: pt required increased time and VC'ing for bilateral hand placement  Ambulation/Gait Ambulation/Gait assistance: Min guard Ambulation Distance (Feet): 3 Feet Assistive device: Rolling walker (2 wheeled) Gait Pattern/deviations: Step-to pattern (hop-to pattern on L LE to maintain NWB R LE with VC's) Gait velocity: decreased Gait velocity interpretation: Below normal speed for age/gender General Gait Details: pt required VC'ing to maintain NWB R LE throughout. pt very limited secondary to fatigue   Stairs            Wheelchair Mobility    Modified Rankin (Stroke Patients Only)       Balance Overall balance assessment: Needs assistance Sitting-balance support: Feet supported;No upper extremity supported Sitting balance-Leahy Scale: Good     Standing balance support: During functional activity;Bilateral upper extremity supported Standing balance-Leahy Scale: Poor Standing balance comment: pt reliant on bilateral UEs on RW                    Cognition Arousal/Alertness: Awake/alert Behavior During Therapy: WFL for tasks assessed/performed Overall Cognitive Status: Within Functional Limits for tasks assessed                      Exercises      General Comments        Pertinent Vitals/Pain Pain Assessment: No/denies pain    Home Living                      Prior Function            PT Goals (  current goals can now be found in the care plan section) Acute Rehab PT Goals Patient Stated Goal: return home PT Goal Formulation: With patient Time For Goal Achievement: 07/02/16 Potential to Achieve Goals: Good Progress towards PT goals: Progressing toward goals    Frequency    Min 5X/week      PT Plan Current plan remains appropriate    Co-evaluation             End of Session Equipment Utilized During Treatment: Gait belt;Other (comment) (post-op shoe on R LE) Activity  Tolerance: Patient limited by fatigue Patient left: in chair;with call bell/phone within reach;with family/visitor present     Time: 2336-1224 PT Time Calculation (min) (ACUTE ONLY): 20 min  Charges:  $Gait Training: 8-22 mins                    G CodesAlessandra [name] [name] 2016/07/23, 5:28 PM Deborah Chalk, PT, DPT [phone]

## 2016-06-26 NOTE — Progress Notes (Signed)
[facility] Progress Note   Dialysis Orders: TTS [facility] 4.25 hours 200 Optiflux 450/800 EDW 123.5 2 K 2 Ca heparin 12K left upper AVF calcitriol 1 venofer 50 q thur and Aranesp 200 q Thursday - both last given 9/21 Recent labs: hgb 9.6 iPTH 90- calcitriol just lowered Ca/P ok hgb A1c 5.5 July  Assessment/Plan: 1. Right great toe Osteomyelitis - s/p amputation of R great toe 9/27- Vanc and Fortaz per ID for a total of 2 weeks, ending 10/10.   2. ESRD -  TTS - no issues with treatment- next HD 9/28 - prob lower edw at d/c, will get standing post-wt if able.  UF 3.5L 3. Hypertension/volume  - gets to EDW/gains variable;. BP 130 - 140s during HD treatment. EDW can maybe come down a little more..  As above 4. Anemia  - hgb per CBC 10.2 - stable on weekly ESA and Fe, Aranesp 200 mcg to be given 9/28. 5. Metabolic bone disease -  Ca/P ok- on VDRA/ binders 6. Nutrition - alb 3.3 VERY unhappy with restricted diet.  Labs/good diabetes control indicate that she is able to make appropriate selections in food choices as an outpatient.  Have liberalized diet to regular to provide her more variety in selections 7. DM - per primary - BS ok hgb A1c 5.1   Bufford ButtnerElizabeth Charlene Detter MD [facility] pager [phone]    cell [phone] 06/26/2016, 8:27 AM    Subjective:   Feeling well this AM .  One episode of mild hypoglycemia which resolved.    Objective Vitals:   06/25/16 2259 06/26/16 0740 06/26/16 0748 06/26/16 0753  BP: 118/75 130/83 122/77 139/84  Pulse: 84 86 87 90  Resp:  16    Temp: 97.8 F (36.6 C) 97.3 F (36.3 C)    TempSrc: Oral Oral    SpO2:      Weight:  122.2 kg (269 lb 6.4 oz)    Height:       Physical Exam General: NAD Heart: RRR Lungs: CTAB no c/w/r Abdomen: obese/large pannus, soft NT Extremities: maybe tr LE edema, R foot dressed with boot in place Dialysis Access:  Left AVF + bruit   Additional Objective Labs: Basic Metabolic Panel:  Recent  Labs Lab 06/21/16 1720  06/24/16 0831 06/25/16 1846 06/26/16 0414  NA 138  < > 138 138 137  K 4.2  < > 4.8 5.1 5.2*  CL 99*  < > 98* 100* 98*  CO2 27  < > 27 27 28   GLUCOSE 114*  < > 73 94 70  BUN 25*  < > 40* 23* 27*  CREATININE 3.69*  < > 6.54* 5.00* 5.62*  CALCIUM 9.2  < > 9.9 9.9 9.8  PHOS 3.9  --   --  5.2*  --   < > = values in this interval not displayed. Liver Function Tests:  Recent Labs Lab 06/24/16 0831 06/25/16 1846 06/26/16 0414  AST 22  --  21  ALT 24  --  16  ALKPHOS 141*  --  137*  BILITOT 1.7*  --  1.4*  PROT 6.3*  --  6.2*  ALBUMIN 3.1* 3.3* 3.2*  CBC:  Recent Labs Lab 06/20/16 0147  06/23/16 0502 06/24/16 0842 06/25/16 1846 06/26/16 0414  WBC 5.2  --  5.3 5.0 4.5 5.2  NEUTROABS 3.5  --  3.3  --   --   --   HGB 10.2*  < > 9.6* 9.3* 10.3* 10.0*  HCT  33.9*  < > 32.4* 31.2* 34.9* 33.8*  MCV 104.6*  --  105.2* 105.1* 106.7* 107.0*  PLT 213  --  201 214 208 204  < > = values in this interval not displayed. Blood Culture    Component Value Date/Time   SDES BLOOD RIGHT HAND 06/20/2016 0630   SPECREQUEST IN PEDIATRIC BOTTLE 3CC 06/20/2016 0630   CULT NO GROWTH 5 DAYS 06/20/2016 0630   REPTSTATUS 06/25/2016 FINAL 06/20/2016 0630    CBG:  Recent Labs Lab 06/25/16 1244 06/25/16 1457 06/25/16 2334 06/26/16 0656 06/26/16 0805  GLUCAP 99 81 91 56* 83   Studies/Results: No results found. Medications: . sodium chloride 10 mL/hr at 06/25/16 1201  . sodium chloride 10 mL/hr at 06/25/16 1245   . atorvastatin  40 mg Oral QHS  . calcitRIOL  1 mcg Oral Q T,Th,Sa-HD  . carvedilol  25 mg Oral BID WC  . cefTAZidime (FORTAZ)  IV  2 g Intravenous Q T,Th,Sat-1800  . darbepoetin (ARANESP) injection - DIALYSIS  200 mcg Intravenous Q Thu-HD  . feeding supplement (NEPRO CARB STEADY)  237 mL Oral QPC lunch  . heparin  5,000 Units Subcutaneous Q8H  . hydrOXYzine  25 mg Oral Q6H  . insulin aspart  0-9 Units Subcutaneous TID WC  . insulin aspart  3 Units  Subcutaneous TID WC  . insulin glargine  12 Units Subcutaneous QHS  . isosorbide dinitrate  20 mg Oral TID  . mupirocin cream   Topical Daily  . sevelamer carbonate  3,200 mg Oral TID WC  . vancomycin  1,000 mg Intravenous Q T,Th,Sa-HD

## 2016-06-26 NOTE — Progress Notes (Signed)
[facility] consulted for home health needs and equipment.  EDCM spoke to Dr. Susie Cassette who reports patient will not need a home health RN for IV antibiotics that the patient will be receiving Vancomycin with dialysis.  EDCM spoke to patient briefly via phone with Selena Batten RN assist.  Patient reports she lives with her boyfriend, mother and her daughter.  Patient reports she does not need a 3 in 1 commode.  She is unsure if she needs a wheelchair or a walker.  She reports the physical therapist stated that patient will basically "be hopping" with the walker.  She reports family can go to store to pick up equipment for her tomorrow if needed.  EDCM received phone call from Crystal CM at [facility] who will see the patient.  No further EDCM needs at this time.

## 2016-06-26 NOTE — Progress Notes (Signed)
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 A walker will not resolve  issue with performing activities of daily living. A wheelchair will allow patient to safely perform daily activities. Patient is not able to propel themselves in the home using a standard weight wheelchair due to weakness, endurance. Patient can self propel in the lightweight wheelchair.  Accessories: elevating leg rests (ELRs), wheel locks, extensions and anti-tippers.

## 2016-06-26 NOTE — Progress Notes (Signed)
Pharmacy Antibiotic Note  [name] is a 35 y.o. female with ESRD on HD TTS was admitted on 06/20/2016 with right toe infection/osteo.  Pharmacy has been consulted for vancomycin and ceftazidime dosing.  Amputation took place 9/27. Per ID, antibiotics are to be continued for two weeks following procedure with end date of 07/08/16. Patient has been tolerating HD well.   Pre-HD vancomycin random level was 7.3. With an increased vancomycin dose of 1250mg , level is estimated to be 18.7, which is within goal range of 15-20.   Plan: Increase to vancomycin 1250mg  IV every TTS w/ HD Ceftazidime 2g IV every TTS at 1800 Monitor clinical progress, HD tolerance, and pre-HD VR as indicated   Height: 5\' 8"  (172.7 cm) Weight: 269 lb 6.4 oz (122.2 kg) IBW/kg (Calculated) : 63.9  Temp (24hrs), Avg:97.6 F (36.4 C), Min:97.3 F (36.3 C), Max:97.8 F (36.6 C)   Recent Labs Lab 06/20/16 0147  06/21/16 1720 06/23/16 0502 06/24/16 0831 06/24/16 0842 06/25/16 1846 06/26/16 0414 06/26/16 0805  WBC 5.2  --   --  5.3  --  5.0 4.5 5.2  --   CREATININE  --   < > 3.69* 5.21* 6.54*  --  5.00* 5.62*  --   VANCORANDOM  --   --   --   --   --   --   --   --  13  < > = values in this interval not displayed.  Estimated Creatinine Clearance: 19.1 mL/min (by C-G formula based on SCr of 5.62 mg/dL (H)).    Allergies  Allergen Reactions  . Aspirin Anaphylaxis  . Sulfur Hives  . Tramadol Other (See Comments)    Pt states she feels weird     Antimicrobials this admission: 9/22 Ceftriaxone >> 9/23 9/22 Flagyl >> 9/23 9/23 Ceftaz >> (10/10) 9/22 Vancomycin >> (10/10)  Dose adjustments this admission: N/A  Microbiology results: 9/22 BCx: NG 9/22 MRSA PCR: negative  Thank you for allowing pharmacy to be a part of this patient's care.  Carylon Perches, PharmD Acute Care Pharmacy Resident  Pager: [phone] 06/26/2016

## 2016-06-26 NOTE — Discharge Summary (Addendum)
Physician Discharge Summary  [name] MRN: [id number] DOB/AGE: 02/07/1981 35 y.o.  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 Sharol Given      Current Discharge Medication List    START taking these medications   Details  cefTAZidime 2 g in dextrose 5 % 50 mL Inject 2 g into the vein every Tuesday, Thursday, and Saturday at 6 PM. Qty: 12 ampule, Refills: 0    methocarbamol (ROBAXIN) 500 MG tablet Take 1 tablet (500 mg total) by mouth every 8 (eight) hours as needed for muscle spasms. Qty: 30 tablet, Refills: 0    oxyCODONE (OXY IR/ROXICODONE) 5 MG immediate release tablet Take 1 tablet (5 mg total) by mouth every 6 (six) hours as needed for breakthrough pain. Qty: 20 tablet, Refills: 0    vancomycin 1,250 mg in sodium chloride 0.9 % 250 mL Inject 1,250 mg into the vein Every Tuesday,Thursday,and Saturday with dialysis. Qty: 12 ampule, Refills: 0      CONTINUE these medications which have CHANGED   Details  Insulin Glargine (LANTUS SOLOSTAR) 100 UNIT/ML Solostar Pen Inject 10 Units into the skin at bedtime. Qty: 15 mL, Refills: 11      CONTINUE these medications which have NOT CHANGED   Details  albuterol (PROVENTIL HFA;VENTOLIN HFA) 108 (90 BASE) MCG/ACT inhaler Inhale 2 puffs into the lungs every 6 (six) hours as  needed for wheezing or shortness of breath.     albuterol (PROVENTIL) (2.5 MG/3ML) 0.083% nebulizer solution Take 2.5 mg by nebulization every 6 (six) hours as needed for wheezing or shortness of breath.    atorvastatin (LIPITOR) 40 MG tablet Take 40 mg by mouth at bedtime.    budesonide (PULMICORT) 0.25 MG/2ML nebulizer solution Take 0.25 mg by nebulization 2 (two) times daily as needed (shortness of breath or wheezing).    calcitRIOL (ROCALTROL) 0.25 MCG capsule Take 0.25 mcg by mouth daily.    carvedilol (COREG) 25 MG tablet Take 25 mg by mouth 2 (two) times daily.    hydrOXYzine (ATARAX/VISTARIL) 25 MG tablet Take 1 tablet (25 mg total) by mouth every 6 (six) hours. Qty: 24 tablet, Refills: 0    insulin aspart (NOVOLOG FLEXPEN) 100 UNIT/ML FlexPen Inject 12-20 Units into the skin 3 (three) times daily with meals. Per sliding scale - based on carb count and CBG    isosorbide dinitrate (ISORDIL) 20 MG tablet Take 20 mg by mouth 3 (three) times daily.    sevelamer (RENAGEL) 800 MG tablet Take 800 mg by mouth daily.          Discharge Condition:Stable    Discharge Instructions Get Medicines reviewed and adjusted: Please take all your medications with you for your next visit with your Primary MD  Please request your Primary MD to go over all hospital tests and procedure/radiological results at the follow up, please  ask your Primary MD to get all Hospital records sent to his/her office.  If you experience worsening of your admission symptoms, develop shortness of breath, life threatening emergency, suicidal or homicidal thoughts you must seek medical attention immediately by calling 911 or calling your MD immediately if symptoms less severe.  You must read complete instructions/literature along with all the possible adverse reactions/side effects for all the Medicines you take and that have been prescribed to you. Take any new Medicines after you have completely understood and  accpet all the possible adverse reactions/side effects.   Do not drive when taking Pain medications.   Do not take more than prescribed Pain, Sleep and Anxiety Medications  Special Instructions: If you have smoked or chewed Tobacco in the last 2 yrs please stop smoking, stop any regular Alcohol and or any Recreational drug use.  Wear Seat belts while driving.  Please note  You were cared for by a hospitalist during your hospital stay. Once you are discharged, your primary care physician will handle any further medical issues. Please note that NO REFILLS for any discharge medications will be authorized once you are discharged, as it is imperative that you return to your primary care physician (or establish a relationship with a primary care physician if you do not have one) for your aftercare needs so that they can reassess your need for medications and monitor your lab values.     Allergies  Allergen Reactions  . Aspirin Anaphylaxis  . Sulfur Hives  . Tramadol Other (See Comments)    Pt states she feels weird       Disposition: Home with home health   Consults:  Orthopedics Nephrology     Significant Diagnostic Studies:  Mr Toes Right W/o Cm  Result Date: 06/22/2016 CLINICAL DATA:  Diabetic patient with an ulceration on the right great toe. Question osteomyelitis or abscess. EXAM: MRI OF THE RIGHT TOES WITHOUT CONTRAST TECHNIQUE: Multiplanar, multisequence MR imaging was performed. No intravenous contrast was administered. COMPARISON:  Plain films the right foot 06/20/2016. FINDINGS: The skin surface appears irregular along the medial margin of the right great toe at the level of the distal phalanx. Mild marrow edema is seen in the tuft of the distal phalanx. Bone marrow signal is otherwise unremarkable. No joint effusion is identified. No abscess is seen. Mild subcutaneous edema over the dorsum of the foot is noted. IMPRESSION: Ulceration on the distal great toe. Mild  marrow edema in the tuft of the distal phalanx is worrisome for osteomyelitis. Negative for abscess or septic joint. Electronically Signed   By: Inge Rise M.D.   On: 06/22/2016 12:38   Dg Foot Complete Right  Result Date: 06/20/2016 CLINICAL DATA:  Subacute onset of ulceration at the right great toe. Initial encounter. EXAM: RIGHT FOOT COMPLETE - 3+ VIEW COMPARISON:  None. FINDINGS: There is no evidence of fracture or dislocation. There is no evidence of osseous erosion. The joint spaces are preserved. There is no evidence of talar subluxation; the subtalar joint is unremarkable in appearance. Scattered vascular calcifications are seen. The known soft tissue ulceration at the right great toe is not well characterized on radiograph. No radiopaque foreign bodies are seen. No significant soft tissue air is noted. Incidental note is made of a bipartite medial sesamoid of the great toe. An os peroneum is noted. IMPRESSION: 1. No evidence of fracture or dislocation. No evidence of osseous erosion. 2. Known ulceration not well characterized. No radiopaque foreign bodies seen. No significant  soft tissue air seen. 3. Scattered vascular calcifications noted. 4. Bipartite medial sesamoid of the great toe. 5. Os peroneum noted. Electronically Signed   By: Garald Balding M.D.   On: 06/20/2016 04:45      Filed Weights   06/21/16 1850 06/24/16 0826 06/24/16 1250  Weight: 122.5 kg (270 lb 1 oz) 123.7 kg (272 lb 11.3 oz) 121.7 kg (268 lb 4.8 oz)     Microbiology: Recent Results (from the past 240 hour(s))  Blood Cultures x 2 sites     Status: None   Collection Time: 06/20/16  4:25 AM  Result Value Ref Range Status   Specimen Description BLOOD RIGHT FOREARM  Final   Special Requests BOTTLES DRAWN AEROBIC AND ANAEROBIC 5ML  Final   Culture NO GROWTH 5 DAYS  Final   Report Status 06/25/2016 FINAL  Final  Blood Cultures x 2 sites     Status: None   Collection Time: 06/20/16  6:30 AM  Result Value Ref  Range Status   Specimen Description BLOOD RIGHT HAND  Final   Special Requests IN PEDIATRIC BOTTLE 3CC  Final   Culture NO GROWTH 5 DAYS  Final   Report Status 06/25/2016 FINAL  Final  MRSA PCR Screening     Status: None   Collection Time: 06/20/16  6:31 AM  Result Value Ref Range Status   MRSA by PCR NEGATIVE NEGATIVE Final    Comment:        The GeneXpert MRSA Assay (FDA approved for NASAL specimens only), is one component of a comprehensive MRSA colonization surveillance program. It is not intended to diagnose MRSA infection nor to guide or monitor treatment for MRSA infections.        Blood Culture    Component Value Date/Time   SDES BLOOD RIGHT HAND 06/20/2016 0630   SPECREQUEST IN PEDIATRIC BOTTLE 3CC 06/20/2016 0630   CULT NO GROWTH 5 DAYS 06/20/2016 0630   REPTSTATUS 06/25/2016 FINAL 06/20/2016 0630      Labs: Results for orders placed or performed during the hospital encounter of 06/20/16 (from the past 48 hour(s))  Comprehensive metabolic panel     Status: Abnormal   Collection Time: 06/24/16  8:31 AM  Result Value Ref Range   Sodium 138 135 - 145 mmol/L   Potassium 4.8 3.5 - 5.1 mmol/L   Chloride 98 (L) 101 - 111 mmol/L   CO2 27 22 - 32 mmol/L   Glucose, Bld 73 65 - 99 mg/dL   BUN 40 (H) 6 - 20 mg/dL   Creatinine, Ser 6.54 (H) 0.44 - 1.00 mg/dL   Calcium 9.9 8.9 - 10.3 mg/dL   Total Protein 6.3 (L) 6.5 - 8.1 g/dL   Albumin 3.1 (L) 3.5 - 5.0 g/dL   AST 22 15 - 41 U/L   ALT 24 14 - 54 U/L   Alkaline Phosphatase 141 (H) 38 - 126 U/L   Total Bilirubin 1.7 (H) 0.3 - 1.2 mg/dL   GFR calc non Af Amer 7 (L) >60 mL/min   GFR calc Af Amer 9 (L) >60 mL/min    Comment: (NOTE) The eGFR has been calculated using the CKD EPI equation. This calculation has not been validated in all clinical situations. eGFR's persistently <60 mL/min signify possible Chronic Kidney Disease.    Anion gap 13 5 - 15  Magnesium     Status: Abnormal   Collection Time: 06/24/16   8:31 AM  Result Value Ref Range   Magnesium 2.5 (H) 1.7 -  2.4 mg/dL  CBC     Status: Abnormal   Collection Time: 06/24/16  8:42 AM  Result Value Ref Range   WBC 5.0 4.0 - 10.5 K/uL   RBC 2.97 (L) 3.87 - 5.11 MIL/uL   Hemoglobin 9.3 (L) 12.0 - 15.0 g/dL   HCT 31.2 (L) 36.0 - 46.0 %   MCV 105.1 (H) 78.0 - 100.0 fL   MCH 31.3 26.0 - 34.0 pg   MCHC 29.8 (L) 30.0 - 36.0 g/dL   RDW 17.8 (H) 11.5 - 15.5 %   Platelets 214 150 - 400 K/uL  Glucose, capillary     Status: Abnormal   Collection Time: 06/24/16  4:32 PM  Result Value Ref Range   Glucose-Capillary 119 (H) 65 - 99 mg/dL   Comment 1 Notify RN    Comment 2 Document in Chart   Glucose, capillary     Status: Abnormal   Collection Time: 06/24/16  9:41 PM  Result Value Ref Range   Glucose-Capillary 157 (H) 65 - 99 mg/dL   Comment 1 Notify RN    Comment 2 Document in Chart   Glucose, capillary     Status: None   Collection Time: 06/25/16  6:21 AM  Result Value Ref Range   Glucose-Capillary 72 65 - 99 mg/dL  Glucose, capillary     Status: None   Collection Time: 06/25/16 11:04 AM  Result Value Ref Range   Glucose-Capillary 87 65 - 99 mg/dL   Comment 1 Notify RN    Comment 2 Document in Chart   Glucose, capillary     Status: None   Collection Time: 06/25/16 12:44 PM  Result Value Ref Range   Glucose-Capillary 99 65 - 99 mg/dL   Comment 1 Notify RN    Comment 2 Document in Chart   Glucose, capillary     Status: None   Collection Time: 06/25/16  2:57 PM  Result Value Ref Range   Glucose-Capillary 81 65 - 99 mg/dL   Comment 1 Notify RN    Comment 2 Document in Chart   Renal function panel     Status: Abnormal   Collection Time: 06/25/16  6:46 PM  Result Value Ref Range   Sodium 138 135 - 145 mmol/L   Potassium 5.1 3.5 - 5.1 mmol/L   Chloride 100 (L) 101 - 111 mmol/L   CO2 27 22 - 32 mmol/L   Glucose, Bld 94 65 - 99 mg/dL   BUN 23 (H) 6 - 20 mg/dL   Creatinine, Ser 5.00 (H) 0.44 - 1.00 mg/dL   Calcium 9.9 8.9 - 10.3  mg/dL   Phosphorus 5.2 (H) 2.5 - 4.6 mg/dL   Albumin 3.3 (L) 3.5 - 5.0 g/dL   GFR calc non Af Amer 10 (L) >60 mL/min   GFR calc Af Amer 12 (L) >60 mL/min    Comment: (NOTE) The eGFR has been calculated using the CKD EPI equation. This calculation has not been validated in all clinical situations. eGFR's persistently <60 mL/min signify possible Chronic Kidney Disease.    Anion gap 11 5 - 15  CBC     Status: Abnormal   Collection Time: 06/25/16  6:46 PM  Result Value Ref Range   WBC 4.5 4.0 - 10.5 K/uL   RBC 3.27 (L) 3.87 - 5.11 MIL/uL   Hemoglobin 10.3 (L) 12.0 - 15.0 g/dL   HCT 34.9 (L) 36.0 - 46.0 %   MCV 106.7 (H) 78.0 - 100.0 fL   MCH 31.5  26.0 - 34.0 pg   MCHC 29.5 (L) 30.0 - 36.0 g/dL   RDW 17.6 (H) 11.5 - 15.5 %   Platelets 208 150 - 400 K/uL  Glucose, capillary     Status: None   Collection Time: 06/25/16 11:34 PM  Result Value Ref Range   Glucose-Capillary 91 65 - 99 mg/dL  Comprehensive metabolic panel     Status: Abnormal   Collection Time: 06/26/16  4:14 AM  Result Value Ref Range   Sodium 137 135 - 145 mmol/L   Potassium 5.2 (H) 3.5 - 5.1 mmol/L   Chloride 98 (L) 101 - 111 mmol/L   CO2 28 22 - 32 mmol/L   Glucose, Bld 70 65 - 99 mg/dL   BUN 27 (H) 6 - 20 mg/dL   Creatinine, Ser 5.62 (H) 0.44 - 1.00 mg/dL   Calcium 9.8 8.9 - 10.3 mg/dL   Total Protein 6.2 (L) 6.5 - 8.1 g/dL   Albumin 3.2 (L) 3.5 - 5.0 g/dL   AST 21 15 - 41 U/L   ALT 16 14 - 54 U/L   Alkaline Phosphatase 137 (H) 38 - 126 U/L   Total Bilirubin 1.4 (H) 0.3 - 1.2 mg/dL   GFR calc non Af Amer 9 (L) >60 mL/min   GFR calc Af Amer 10 (L) >60 mL/min    Comment: (NOTE) The eGFR has been calculated using the CKD EPI equation. This calculation has not been validated in all clinical situations. eGFR's persistently <60 mL/min signify possible Chronic Kidney Disease.    Anion gap 11 5 - 15  CBC     Status: Abnormal   Collection Time: 06/26/16  4:14 AM  Result Value Ref Range   WBC 5.2 4.0 - 10.5  K/uL   RBC 3.16 (L) 3.87 - 5.11 MIL/uL   Hemoglobin 10.0 (L) 12.0 - 15.0 g/dL   HCT 33.8 (L) 36.0 - 46.0 %   MCV 107.0 (H) 78.0 - 100.0 fL   MCH 31.6 26.0 - 34.0 pg   MCHC 29.6 (L) 30.0 - 36.0 g/dL   RDW 17.4 (H) 11.5 - 15.5 %   Platelets 204 150 - 400 K/uL  Glucose, capillary     Status: Abnormal   Collection Time: 06/26/16  6:56 AM  Result Value Ref Range   Glucose-Capillary 56 (L) 65 - 99 mg/dL     Lipid Panel     Component Value Date/Time   CHOL 274 (H) 10/25/2013 1257   TRIG 180 (H) 10/25/2013 1257   HDL 62 10/25/2013 1257   CHOLHDL 4.4 10/25/2013 1257   VLDL 36 10/25/2013 1257   LDLCALC 176 (H) 10/25/2013 1257     Lab Results  Component Value Date   HGBA1C 5.1 06/20/2016   HGBA1C 7.4 (H) 03/19/2015   HGBA1C 10.0 (H) 09/15/2013        HPI :  35 yo F with hx of DM1 (since 35 yo), ESRD (since June 2017), comes to ED on 9-22 with worsening R great toe ulceration. She developed a sore on her R great toe after a door scraped over the top of the toe. She was seen and started on clinda 2 weeks ago. After 3 days she was seen again and was not felt to be improving. She has had wound d/c and pain. Mild erythema.  She was then eval by podiatry and underwent I + D (9-18). She was started on keflex. She presented to the ED on 9-22 after her wound was worsening.  She  was started on flagyl/ceftriaxone/vanco on adm.  She has been afebrile. Her WBC is normal. A plain film did not show osteomyelitis.  She had ABI that showed non-compressible vessels.   HOSPITAL COURSE:    Diabetic foot infection Rt great toe Osteomyelitis + cellulitis  - MRI show suspicious for osteo -ABI showed no compressible vessels. -  Infectious disease was consulted, Abx per ID , she received IV antibiotics: Day #5 of vanco/ceftaz - Ortho consulted; status post amputation of the R great toe  9/27 - F/u blood cultures no growth so far According to Dr. Johnnye Sima, give 2 weeks of ceftaz/vanco with HD  afterwards,End date 07-08-16    DM type 1 uncontrolled with, complications, recurrent hypoglycemia - 9/22 Hemoglobin A1c = 5.1   - Lantus 10 units daily, dose reduced due to low cbg this admission -NovoLog 3 units QAC -Sensitive SSI  Morbid Obesity Body mass index is 40.79 kg/m. - Dietary consult  Chronic Systolic and Diastolic CHF -0/31/2811 echocardiogram EF 88-67%, grade 2 diastolic dysfunction with moderate pericardial effusion -Obtain repeat echocardiogram prior to surgery  Hypertension   - Coreg 25 mg BID  -Imdur 20 mg TID  ESRD (end stage renal disease) on HD T/Th/Sat, - Renal consulted for routine dialysis.TTS   Discharge Exam:   Blood pressure 118/75, pulse 84, temperature 97.8 F (36.6 C), temperature source Oral, resp. rate 18, height 5' 8"  (1.727 m), weight 121.7 kg (268 lb 4.8 oz), last menstrual period 06/08/2016, SpO2 98 %.   Eyes: negative findings: conjunctivae and sclerae normal and pupils equal, round, reactive to light and accomodation Throat: lips, mucosa, and tongue normal; teeth and gums normal Neck: no adenopathy and supple, symmetrical, trachea midline Lungs: clear to auscultation bilaterally Heart: regular rate and rhythm Abdomen: normal findings: bowel sounds normal and soft, non-tender Extremities: edema minimal and R great toe with dressed wounds. tender. erythema. no purulence.    Follow-up Information    Newt Minion, MD In 1 week.   Specialty:  Orthopedic Surgery Contact information: [place] [address] [phone]           Signed: Reyne Dumas 06/26/2016, 8:05 AM        Time spent >45 mins

## 2016-06-26 NOTE — Progress Notes (Signed)
Reviewed discharge papers with pt and husband

## 2016-06-26 NOTE — Progress Notes (Signed)
While going over discharge papers with [name] she stated that Dr [name] was going to do Home health. There were no ordered noted in pts chart. Called Dr [name] to confirm this and he stated that after her first visit we would determine weather or not she would need home health. Charlynn Grimes

## 2016-06-26 NOTE — Progress Notes (Signed)
Dr Susie Cassette, called for repeat CBG on [name] called dialysis it is 10

## 2016-06-26 NOTE — Care Management Note (Addendum)
Case Management Note  Patient Details  Name: [name] MRN: [id number] Date of Birth: 1981/07/07  Subjective/Objective:                  R great toe and partial 1st metatarsal amputation  Action/Plan: CM spoke with patient at the bedside. Delivered a RW and 3N1. Patient selects [facility] for [facility] services. Discussed the option of purchasing a wheelchair at a discount store if unable to obtain thru Medicaid. Patient is agreeable. Orders faxed to [facility].   Expected Discharge Date:    06/26/16             Expected Discharge Plan:  Home w Home Health Services  In-House Referral:     Discharge planning Services  CM Consult  Post Acute Care Choice:  Home Health Choice offered to:  Patient  DME Arranged:  3-N-1, Lightweight manual wheelchair with seat cushion, Walker rolling DME Agency:  [facility]  HH Arranged:  RN, PT, OT, Social Work, Nurse's Aide HH Agency:  [facility]  Status of Service:  Completed, signed off  If discussed at [employer], dates discussed:    Additional Comments:  Antony Haste, RN 06/26/2016, 8:08 PM

## 2016-06-26 NOTE — Procedures (Signed)
Patient seen on Hemodialysis. QB 400, UF goal 3.5L.  Feeling well, will challenge a little bit more. Treatment adjusted as needed.  Bufford Buttner [employer]. Cell [phone] pgr [phone] 8:31 AM

## 2016-06-27 NOTE — Progress Notes (Signed)
06/27/2016 A. Lula Kolton RNCM 1606pm EDCM spoke to Clydie Braun of [facility].  She reports she has received the referral for home health services, but since patient only has Medicaid, she is only eligible for RN, aide and social work.  EDCM informed Clydie Braun that patient will be needing RN for possible wound care to right foot due to amputation.  No further EDCM needs at this time.

## 2016-06-27 NOTE — Progress Notes (Signed)
06/27/2016 1945pm [name] [facility] called patient for follow up.  Patient reports she is doing okay.  She reports [facility] is coming to see her on Sunday.  She reports they were going to come out on Saturday but she is going to dialysis.  Patient reports she receives dialysis at [facility].  She is very aware that she is to receive IV antibiotics with her dialysis. She reports the prescriptions for the IV abx are at her personal pharmacy.  She reports if she needs them for her appointment, she will have her boyfriend pick them up for her.  [facility] encouraged patient to call her dialysis center in the am to see if she needs to bring her prescriptions, or if her pharmacy can fax the prescriptions to her dialysis center.  [place] [facility] encouraged patient to bring her hospital discharge paperwork with her to her dialysis appointment tomorrow.  Patient very thankful for follow up phone call.  No further EDCM needs at this time.

## 2016-07-07 ENCOUNTER — Inpatient Hospital Stay (INDEPENDENT_AMBULATORY_CARE_PROVIDER_SITE_OTHER): Payer: Medicaid Other | Admitting: Orthopedic Surgery

## 2016-07-07 DIAGNOSIS — M86271 Subacute osteomyelitis, right ankle and foot: Secondary | ICD-10-CM

## 2016-07-21 ENCOUNTER — Telehealth (INDEPENDENT_AMBULATORY_CARE_PROVIDER_SITE_OTHER): Payer: Self-pay | Admitting: Radiology

## 2016-07-21 ENCOUNTER — Other Ambulatory Visit (INDEPENDENT_AMBULATORY_CARE_PROVIDER_SITE_OTHER): Payer: Self-pay | Admitting: Family

## 2016-07-21 ENCOUNTER — Telehealth (INDEPENDENT_AMBULATORY_CARE_PROVIDER_SITE_OTHER): Payer: Self-pay

## 2016-07-21 ENCOUNTER — Encounter (INDEPENDENT_AMBULATORY_CARE_PROVIDER_SITE_OTHER): Payer: Self-pay | Admitting: Orthopedic Surgery

## 2016-07-21 ENCOUNTER — Ambulatory Visit (INDEPENDENT_AMBULATORY_CARE_PROVIDER_SITE_OTHER): Payer: Medicaid Other | Admitting: Orthopedic Surgery

## 2016-07-21 ENCOUNTER — Other Ambulatory Visit (INDEPENDENT_AMBULATORY_CARE_PROVIDER_SITE_OTHER): Payer: Self-pay | Admitting: Orthopedic Surgery

## 2016-07-21 VITALS — Ht 67.0 in | Wt 254.0 lb

## 2016-07-21 DIAGNOSIS — Z89411 Acquired absence of right great toe: Secondary | ICD-10-CM

## 2016-07-21 MED ORDER — PREGABALIN 50 MG PO CAPS: 50.0000 mg | ORAL_CAPSULE | Freq: Three times a day (TID) | ORAL | 1 refills | Status: DC

## 2016-07-21 MED ORDER — DULOXETINE HCL 30 MG PO CPEP: 30.0000 mg | ORAL_CAPSULE | Freq: Every day | ORAL | 3 refills | Status: AC

## 2016-07-21 NOTE — Telephone Encounter (Signed)
Patient called triage LMVM, IC her back and she is having bleeding through her dressing, she has busted a stitch per the [facility], who last saw her Friday.  Workin for today made as patient does not have appt til next week.

## 2016-07-21 NOTE — Telephone Encounter (Signed)
Prescription sent for Cymbalta. If she does not tolerate Cymbalta and Neurontin then we can try Lyrica as per her insurance guidelines

## 2016-07-21 NOTE — Progress Notes (Deleted)
Patient is alert oriented no adenopathy well-dressed normal affect normal respiratory effort.  Patient lacks protective sensation and does not have the dexterity to reach their toes. After informed consent, thickened discolored onychomycotic nails were trimmed, less than 6 nails.  Silver nitrate was used for hemostasis, patient tolerated the procedure without complications. Dr. Roda Shutters  Dr. Deno Etienne  Dr. Colon Flattery. Roda Shutters  1:44 PM  PATIENT:  [name]    PRE-OPERATIVE DIAGNOSIS:  * No surgery found *  POST-OPERATIVE DIAGNOSIS:  Same  PROCEDURE:  * Surgery not found *  SURGEON:  Nadara Mustard, MD  PHYSICIAN ASSISTANT:None ANESTHESIA:   General  PREOPERATIVE INDICATIONS:  [name] is a  35 y.o. female with a diagnosis of * No surgery found * who failed conservative measures and elected for surgical management.    The risks benefits and alternatives were discussed with the patient preoperatively including but not limited to the risks of infection, bleeding, nerve injury, cardiopulmonary complications, the need for revision surgery, among others, and the patient was willing to proceed.  OPERATIVE IMPLANTS: ***  OPERATIVE FINDINGS: ***  OPERATIVE PROCEDURE: ***

## 2016-07-21 NOTE — Progress Notes (Signed)
Wound Care Note   Patient: [name]           Date of Birth: 01-21-1981           MRN: [id number]             PCP: Elizabeth PalauANDERSON,TERESA, FNP Visit Date: 07/21/2016   Assessment & Plan: Visit Diagnoses: No diagnosis found.  Plan: Continue with daily wound cleansing. Dry dressings. Will minimize weight bearing. May touch down weight bearing through the heel for transfers. Will follow up in office in 2 weeks.  Follow-Up Instructions: No Follow-up on file.  Orders:  No orders of the defined types were placed in this encounter.  No orders of the defined types were placed in this encounter.     Procedures: No notes on file   Clinical Data: No additional findings.   No images are attached to the encounter.   Subjective: Chief Complaint  Patient presents with  . Right Foot - Routine Post Op    Right foot GT amp on 06/25/16    06/25/16 Right GT amp. Pt is " trying to be non weight bearing" in post op shoe today and in wheelchair. Stitches are intact and there is some swelling and maceration around incision site. Spot amt bloody drainage. She is not on an ABX and using a dry dressing change daily. Waiting on insurance prior auth for Lyrica approval currently 800$ out of pocket. Robaxin is not helping with the spasms and pt is not taking this medication. Percocet was last filled on 07/07/16 #60 and states that she is using this PRN. " tries to tolerate the pain as much as she can" so she can function during the day.    Review of Systems  Constitutional: Negative for chills and fever.    Miscellaneous:  -Home Health Care: no   -Physical Therapy:no  -Out of Work?: yes  -Worker's Compensation Case?: no    Objective: Vital Signs: Ht 5\' 7"  (1.702 m)   Wt 254 lb (115.2 kg)   LMP 06/08/2016   BMI 39.78 kg/m   Physical Exam: Moderate swelling right foot.  Incision is healing well. Sutures were harvested. There is an area that has yet to heal distally. This ulcer is 3 mm in  diameter. Some dried blood. Wound bed filled 50% with fibrinous exudative tissue. No surrounding erythema or sign of infection.   Specialty Comments: No specialty comments available.   PMFS History: Patient Active Problem List   Diagnosis Date Noted  . Diabetic osteomyelitis (HCC)   . Gangrene (HCC)   . 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 Diabetic foot infection (HCC) 06/20/2016  . ESRD (end stage renal disease) on dialysis (HCC) 06/20/2016  . Diabetic foot ulcer (HCC) 06/20/2016  . Pre-operative clearance 10/12/2015  . Normal coronary arteries 10/12/2015  . NSTEMI- type 2, Troponin 11.2 05/11/2015  . Respiratory failure requiring intubation (HCC) 05/10/2015  . History of arthroscopy of right shoulder-05/10/15 05/10/2015  . Cellulitis of right lower extremity 09/21/2014  . CKD (chronic kidney disease) stage 5, GFR less than 15 ml/min (HCC) 09/21/2014  . Cellulitis of foot, right 09/21/2014  . Unspecified asthma(493.90) 10/25/2013  . Acute combined systolic and diastolic heart failure (HCC) 09/21/2013  . Non-ischemic cardiomyopathy - by echo 8/16- EF 35-40% 09/16/2013  . Morbid obesity-  09/16/2013  . 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) 09/15/2013  . Normocytic anemia 09/15/2013  . Lower extremity edema 09/15/2013  . Hyperlipidemia   .  Hypertension    Past Medical History:  Diagnosis Date  . Anemia   . Arthritis    "left hand" (09/15/2013)  . Asthma   . Chronic bronchitis (HCC)    "just about q yr" (09/15/2013)  . Chronic kidney disease    "low kidney function" (09/15/2013)  . COPD (chronic obstructive pulmonary disease) (HCC)   . Coronary artery disease   . Hyperlipidemia   . Hypertension   . Migraine    "get them alot" (09/15/2013)  . Myocardial infarction 04/2015   NSTEMI  . Normal coronary arteries    by cardiac catheterization 09/20/13  . Pneumonia    "couple times; have it now" (09/15/2013)  . Shortness of breath     "just recently; related to the pneumonia" (09/15/2013)  . Type 1 diabetes mellitus (HCC)    type 2    Family History  Problem Relation Age of Onset  . Diabetes Mother   . Stroke Mother   . Hypertension Father   . Hyperlipidemia Father   . Cancer - Lung Father   . Diabetes Maternal Grandmother   . Cancer Paternal Grandmother    Past Surgical History:  Procedure Laterality Date  . AMPUTATION Right 06/25/2016   Procedure: Amputation Right Great Toe at the Metatarsophalangeal Joint;  Surgeon: Nadara Mustard, MD;  Location: [facility] OR;  Service: Orthopedics;  Laterality: Right;  . AV FISTULA PLACEMENT Left 03/27/2015   Procedure: CREATION RADIAL CEPHALIC ARTERIOVENOUS FISTULA;  Surgeon: Chuck Hint, MD;  Location: [facility] OR;  Service: Vascular;  Laterality: Left;  . AV FISTULA PLACEMENT Left 11/23/2015   Procedure:  LEFT ARM BASILIC VEIN TRANSPOSITION  ;  Surgeon: Chuck Hint, MD;  Location: [facility] OR;  Service: Vascular;  Laterality: Left;  . CESAREAN SECTION  1999; 2006  . CORONARY ANGIOGRAM  09/20/2013   Procedure: CORONARY ANGIOGRAM;  Surgeon: Runell Gess, MD;  Location: [facility] CATH LAB;  Service: Cardiovascular;;  . FINGER SURGERY Left 1985   3rd and 4th digits reconstructed after cut off" (09/15/2013)  . SHOULDER ARTHROSCOPY WITH BICEPSTENOTOMY Right 05/10/2015   Procedure: RIGHT SHOULDER ARTHROSCOPY WITH BICEPS TENOTOMY, DEBRIDEMENT LABRAL TEAR;  Surgeon: Jones Broom, MD;  Location: [facility] OR;  Service: Orthopedics;  Laterality: Right;  Right shoulder arthroscopy biceps tenotomy, debridement labral tear  . TONSILLECTOMY  1997  . TUBAL LIGATION  2006   Social History   Occupational History  . Student    Social History Main Topics  . Smoking status: Never Smoker  . Smokeless tobacco: Never Used  . Alcohol use No  . Drug use: No  . Sexual activity: Not Currently    Partners: Male    Birth control/ protection: Other-see comments     Comment: S/P tubal ligation

## 2016-07-21 NOTE — Telephone Encounter (Deleted)
Pharm faxed and advised that this pt requires prior auth for Lyrica. This is not on the preferred list of medications for the pt. Neurontin and Cymbalta are preferred medications. Please advise what to do.

## 2016-07-22 ENCOUNTER — Ambulatory Visit (INDEPENDENT_AMBULATORY_CARE_PROVIDER_SITE_OTHER): Payer: Medicaid Other | Admitting: Orthopedic Surgery

## 2016-07-22 DIAGNOSIS — S91301D Unspecified open wound, right foot, subsequent encounter: Secondary | ICD-10-CM

## 2016-07-22 MED ORDER — NITROGLYCERIN 0.2 MG/HR TD PT24: 0.2000 mg | MEDICATED_PATCH | Freq: Every day | TRANSDERMAL | 3 refills | Status: DC

## 2016-07-22 NOTE — Progress Notes (Signed)
Wound Care Note   Patient: [name]           Date of Birth: [birth year]           MRN: [id number]             PCP: Elizabeth PalauANDERSON,TERESA, FNP Visit Date: 07/22/2016   Assessment & Plan: Visit Diagnoses: No diagnosis found.  Plan: Start nitroglycerin patch changes daily Dial soap cleansing daily nonweightbearing right foot follow-up as scheduled in 2 weeks   Follow-Up Instructions: Return in about 2 weeks (around 08/05/2016).  Orders:  No orders of the defined types were placed in this encounter.  No orders of the defined types were placed in this encounter.     Procedures: No notes on file   Clinical Data: No additional findings.   No images are attached to the encounter.   Subjective: Chief Complaint  Patient presents with  . Right Great Toe - Open Wound    Pt states that last night after her shower she went to change her dressing and noticed that the insicion at her great toe had opened up.  Patient states that she called the on call provider which was Dr. Cleophas DunkerWhitfield , he advised to put a clean dressing and bacitracin on the open wound and to call the office this morning. She does have an open wound that does have a odor with discharge.    Review of Systems  Miscellaneous:  -Home Health Care: N/A  -Physical Therapy: N/A  -Out of Work?: N/A  -Worker's Compensation Case?: N/A  -Additional Information: N/A   Objective: Vital Signs: LMP 06/08/2016   Physical Exam: Patient has had wound dehiscence. There is no cellulitis no odor no drainage no signs of infection.  Specialty Comments: No specialty comments available.   PMFS History: Patient Active Problem List   Diagnosis Date Noted  . Diabetic osteomyelitis (HCC)   . Gangrene (HCC)   . 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 Diabetic foot infection (HCC) 06/20/2016  . ESRD (end stage renal disease) on dialysis (HCC) 06/20/2016  . Diabetic foot ulcer (HCC) 06/20/2016  . Pre-operative clearance  10/12/2015  . Normal coronary arteries 10/12/2015  . NSTEMI- type 2, Troponin 11.2 05/11/2015  . Respiratory failure requiring intubation (HCC) 05/10/2015  . History of arthroscopy of right shoulder-05/10/15 05/10/2015  . Cellulitis of right lower extremity 09/21/2014  . CKD (chronic kidney disease) stage 5, GFR less than 15 ml/min (HCC) 09/21/2014  . Cellulitis of foot, right 09/21/2014  . Unspecified asthma(493.90) 10/25/2013  . Acute combined systolic and diastolic heart failure (HCC) 09/21/2013  . Non-ischemic cardiomyopathy - by echo 8/16- EF 35-40% 09/16/2013  . Morbid obesity-  09/16/2013  . 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) 09/15/2013  . Normocytic anemia 09/15/2013  . Lower extremity edema 09/15/2013  . Hyperlipidemia   . Hypertension    Past Medical History:  Diagnosis Date  . Anemia   . Arthritis    "left hand" (09/15/2013)  . Asthma   . Chronic bronchitis (HCC)    "just about q yr" (09/15/2013)  . Chronic kidney disease    "low kidney function" (09/15/2013)  . COPD (chronic obstructive pulmonary disease) (HCC)   . Coronary artery disease   . Hyperlipidemia   . Hypertension   . Migraine    "get them alot" (09/15/2013)  . Myocardial infarction 04/2015   NSTEMI  . Normal coronary arteries    by cardiac catheterization 09/20/13  . Pneumonia    "  couple times; have it now" (09/15/2013)  . Shortness of breath    "just recently; related to the pneumonia" (09/15/2013)  . Type 1 diabetes mellitus (HCC)    type 2    Family History  Problem Relation Age of Onset  . Diabetes Mother   . Stroke Mother   . Hypertension Father   . Hyperlipidemia Father   . Cancer - Lung Father   . Diabetes Maternal Grandmother   . Cancer Paternal Grandmother    Past Surgical History:  Procedure Laterality Date  . AMPUTATION Right 06/25/2016   Procedure: Amputation Right Great Toe at the Metatarsophalangeal Joint;  Surgeon: Nadara Mustard, MD;  Location: [facility]  OR;  Service: Orthopedics;  Laterality: Right;  . AV FISTULA PLACEMENT Left 03/27/2015   Procedure: CREATION RADIAL CEPHALIC ARTERIOVENOUS FISTULA;  Surgeon: Chuck Hint, MD;  Location: [facility] OR;  Service: Vascular;  Laterality: Left;  . AV FISTULA PLACEMENT Left 11/23/2015   Procedure:  LEFT ARM BASILIC VEIN TRANSPOSITION  ;  Surgeon: Chuck Hint, MD;  Location: [facility] OR;  Service: Vascular;  Laterality: Left;  . CESAREAN SECTION  1999; 2006  . CORONARY ANGIOGRAM  09/20/2013   Procedure: CORONARY ANGIOGRAM;  Surgeon: Runell Gess, MD;  Location: [facility] CATH LAB;  Service: Cardiovascular;;  . FINGER SURGERY Left 1985   3rd and 4th digits reconstructed after cut off" (09/15/2013)  . SHOULDER ARTHROSCOPY WITH BICEPSTENOTOMY Right 05/10/2015   Procedure: RIGHT SHOULDER ARTHROSCOPY WITH BICEPS TENOTOMY, DEBRIDEMENT LABRAL TEAR;  Surgeon: Jones Broom, MD;  Location: [facility] OR;  Service: Orthopedics;  Laterality: Right;  Right shoulder arthroscopy biceps tenotomy, debridement labral tear  . TONSILLECTOMY  1997  . TUBAL LIGATION  2006   Social History   Occupational History  . Student    Social History Main Topics  . Smoking status: Never Smoker  . Smokeless tobacco: Never Used  . Alcohol use No  . Drug use: No  . Sexual activity: Not Currently    Partners: Male    Birth control/ protection: Other-see comments     Comment: S/P tubal ligation

## 2016-07-22 NOTE — Telephone Encounter (Signed)
Pt is in office today and have advised of the rx for cymbalta to try this and let me know if this does not work for her.

## 2016-07-23 ENCOUNTER — Telehealth (INDEPENDENT_AMBULATORY_CARE_PROVIDER_SITE_OTHER): Payer: Self-pay | Admitting: *Deleted

## 2016-07-23 ENCOUNTER — Telehealth (INDEPENDENT_AMBULATORY_CARE_PROVIDER_SITE_OTHER): Payer: Self-pay | Admitting: Radiology

## 2016-07-23 NOTE — Telephone Encounter (Signed)
error 

## 2016-07-23 NOTE — Telephone Encounter (Signed)
Marisue Ivan [facility] from [facility] calling 11:01pm leaving a vm on triage line. She went out to see patient and states there is maceration at surgical site, wanting to know if MD would consider betadine swab cleaning. Advised per MD who was contacted by phone while at the hospital that he does not want betadine cleaning. To continue with daily dry dressing changes to increase frequency of dressing changes if drainage is causing maceration and begin nitroglycerin patches as directed yesterday evening while patient was in the office last night.

## 2016-07-25 ENCOUNTER — Telehealth (INDEPENDENT_AMBULATORY_CARE_PROVIDER_SITE_OTHER): Payer: Self-pay | Admitting: Radiology

## 2016-07-25 ENCOUNTER — Ambulatory Visit (INDEPENDENT_AMBULATORY_CARE_PROVIDER_SITE_OTHER): Payer: Medicaid Other | Admitting: Family

## 2016-07-25 ENCOUNTER — Encounter (INDEPENDENT_AMBULATORY_CARE_PROVIDER_SITE_OTHER): Payer: Self-pay | Admitting: Family

## 2016-07-25 DIAGNOSIS — Z89411 Acquired absence of right great toe: Secondary | ICD-10-CM

## 2016-07-25 DIAGNOSIS — T8131XA Disruption of external operation (surgical) wound, not elsewhere classified, initial encounter: Secondary | ICD-10-CM

## 2016-07-25 DIAGNOSIS — M86171 Other acute osteomyelitis, right ankle and foot: Secondary | ICD-10-CM

## 2016-07-25 MED ORDER — DOXYCYCLINE HYCLATE 100 MG PO TABS: 100.0000 mg | ORAL_TABLET | Freq: Two times a day (BID) | ORAL | 0 refills | Status: DC

## 2016-07-25 MED ORDER — COLLAGENASE 250 UNIT/GM EX OINT: 1.0000 "application " | TOPICAL_OINTMENT | Freq: Every day | CUTANEOUS | 0 refills | Status: DC

## 2016-07-25 MED ORDER — COLLAGENASE 250 UNIT/GM EX OINT: TOPICAL_OINTMENT | Freq: Every day | CUTANEOUS | Status: DC

## 2016-07-25 NOTE — Addendum Note (Signed)
Addended by: Barnie Del R on: 07/25/2016 04:43 PM   Modules accepted: Orders

## 2016-07-25 NOTE — Addendum Note (Signed)
Addended by: Barnie Del R on: 07/25/2016 04:36 PM   Modules accepted: Orders

## 2016-07-25 NOTE — Telephone Encounter (Signed)
Referral faxed to [facility], and rx submitted to pharmacy.

## 2016-07-25 NOTE — Telephone Encounter (Signed)
Patient called stating that the pharmacy has not rec'd her meds yet. Please call pt once this has been done.

## 2016-07-25 NOTE — Telephone Encounter (Signed)
HAVE SENT IN RXS FOR SANTYL AND DOXY  I'LL PUT IN REFERRAL FOR HH TO START COMING 3 TIMES WEEKLY FOR DRESSING CHANGES

## 2016-07-25 NOTE — Progress Notes (Signed)
Wound Care Note   Patient: [name]           Date of Birth: 12/13/1980           MRN: [id number]             PCP: Elizabeth PalauANDERSON,TERESA, FNP Visit Date: 07/25/2016   Assessment & Plan: Visit Diagnoses:  1. Disruption or dehiscence of closure of skin, initial encounter   2. Acute osteomyelitis of toe, right (HCC)   3. Status post amputation of right great toe [facility](HCC)     Plan: Patient will pack wound open with Santyl dressings. Change dressings daily. We will have [facility] began coming out to the house 2-3 times a week to assist with wound care. Have called in a prescription for doxycycline as well as the Santyl ointment. Emphasized the importance of elevation as well as compression.  Follow-Up Instructions: Return in about 2 weeks (around 08/08/2016).  Orders:  No orders of the defined types were placed in this encounter.  Meds ordered this encounter  Medications  . collagenase (SANTYL) ointment  . doxycycline (VIBRA-TABS) 100 MG tablet    Sig: Take 1 tablet (100 mg total) by mouth 2 (two) times daily.    Dispense:  60 tablet    Refill:  0      Procedures: No notes on file   Clinical Data: No additional findings.   No images are attached to the encounter.   Subjective: Chief Complaint  Patient presents with  . Right Foot - Open Wound  . Open Wound    status post amputation of right great toe 07/22/16    Patient presents for work in appointment for right great toe amputation performed on 06/25/16. She is currently one month post op. Patient was seen in the office earlier this week, when sutures were removed. She presents in office today for wound dehiscence of surgical site. There is redness, swelling and increase in pain. She states the pain is unbearable. She has begun nitrogylcerin patches since last office visit for microcirculation. There is a foul smelling odor coming from wound with fibronous exudate and maceration. Patient is extremely tender to touch  when dressing was removed in office. She is doing dial soap cleansing with dry dressing changes daily. She is currently in wheelchair and states she is only weightbearing for transfers.    Review of Systems  Constitutional: Negative for chills and fever.    Miscellaneous:  -Home Health Care: Home health nursing coming out once a week  -Physical Therapy: no, patient is nonweightbearing with right lower extremity    Objective: Vital Signs: LMP 06/08/2016   Physical Exam: The incision has gaped. This does not probe. Wound is 25 mm long. And 5 mm deep. There is fibrinous exudative tissue in the wound bed, 100%. No drainage. There is surrounding maceration and erythema. No ascending cellulitis  Specialty Comments: No specialty comments available.   PMFS History: Patient Active Problem List   Diagnosis Date Noted  . Diabetic osteomyelitis (HCC)   . Gangrene (HCC)   . 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 Diabetic foot infection (HCC) 06/20/2016  . ESRD (end stage renal disease) on dialysis (HCC) 06/20/2016  . Diabetic foot ulcer (HCC) 06/20/2016  . Pre-operative clearance 10/12/2015  . Normal coronary arteries 10/12/2015  . NSTEMI- type 2, Troponin 11.2 05/11/2015  . Respiratory failure requiring intubation (HCC) 05/10/2015  . History of arthroscopy of right shoulder-05/10/15 05/10/2015  . Cellulitis of right lower  extremity 09/21/2014  . CKD (chronic kidney disease) stage 5, GFR less than 15 ml/min (HCC) 09/21/2014  . Cellulitis of foot, right 09/21/2014  . Unspecified asthma(493.90) 10/25/2013  . Acute combined systolic and diastolic heart failure (HCC) 09/21/2013  . Non-ischemic cardiomyopathy - by echo 8/16- EF 35-40% 09/16/2013  . Morbid obesity-  09/16/2013  . 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) 09/15/2013  . Normocytic anemia 09/15/2013  . Lower extremity edema 09/15/2013  . Hyperlipidemia   . Hypertension    Past Medical History:    Diagnosis Date  . Anemia   . Arthritis    "left hand" (09/15/2013)  . Asthma   . Chronic bronchitis (HCC)    "just about q yr" (09/15/2013)  . Chronic kidney disease    "low kidney function" (09/15/2013)  . COPD (chronic obstructive pulmonary disease) (HCC)   . Coronary artery disease   . Hyperlipidemia   . Hypertension   . Migraine    "get them alot" (09/15/2013)  . Myocardial infarction 04/2015   NSTEMI  . Normal coronary arteries    by cardiac catheterization 09/20/13  . Pneumonia    "couple times; have it now" (09/15/2013)  . Shortness of breath    "just recently; related to the pneumonia" (09/15/2013)  . Type 1 diabetes mellitus (HCC)    type 2    Family History  Problem Relation Age of Onset  . Diabetes Mother   . Stroke Mother   . Hypertension Father   . Hyperlipidemia Father   . Cancer - Lung Father   . Diabetes Maternal Grandmother   . Cancer Paternal Grandmother    Past Surgical History:  Procedure Laterality Date  . AMPUTATION Right 06/25/2016   Procedure: Amputation Right Great Toe at the Metatarsophalangeal Joint;  Surgeon: Nadara Mustard, MD;  Location[facility] OR;  Service: Orthopedics;  Laterality: Right;  . AV FISTULA PLACEMENT Left 03/27/2015   Procedure: CREATION RADIAL CEPHALIC ARTERIOVENOUS FISTULA;  Surgeon: Chuck Hint, MD;  Location: [facility] OR;  Service: Vascular;  Laterality: Left;  . AV FISTULA PLACEMENT Left 11/23/2015   Procedure:  LEFT ARM BASILIC VEIN TRANSPOSITION  ;  Surgeon: Chuck Hint, MD;  Location: [facility] OR;  Service: Vascular;  Laterality: Left;  . CESAREAN SECTION  1999; 2006  . CORONARY ANGIOGRAM  09/20/2013   Procedure: CORONARY ANGIOGRAM;  Surgeon: Runell Gess, MD;  Location: [facility] CATH LAB;  Service: Cardiovascular;;  . FINGER SURGERY Left 1985   3rd and 4th digits reconstructed after cut off" (09/15/2013)  . SHOULDER ARTHROSCOPY WITH BICEPSTENOTOMY Right 05/10/2015   Procedure: RIGHT SHOULDER ARTHROSCOPY WITH BICEPS  TENOTOMY, DEBRIDEMENT LABRAL TEAR;  Surgeon: Jones Broom, MD;  Location: [facility] OR;  Service: Orthopedics;  Laterality: Right;  Right shoulder arthroscopy biceps tenotomy, debridement labral tear  . TONSILLECTOMY  1997  . TUBAL LIGATION  2006   Social History   Occupational History  . Student    Social History Main Topics  . Smoking status: Never Smoker  . Smokeless tobacco: Never Used  . Alcohol use No  . Drug use: No  . Sexual activity: Not Currently    Partners: Male    Birth control/ protection: Other-see comments     Comment: S/P tubal ligation

## 2016-07-28 ENCOUNTER — Ambulatory Visit (INDEPENDENT_AMBULATORY_CARE_PROVIDER_SITE_OTHER): Payer: Medicaid Other | Admitting: Orthopedic Surgery

## 2016-07-28 ENCOUNTER — Encounter: Payer: Medicaid Other | Attending: Surgery | Admitting: Surgery

## 2016-07-28 DIAGNOSIS — I429 Cardiomyopathy, unspecified: Secondary | ICD-10-CM | POA: Diagnosis not present

## 2016-07-28 DIAGNOSIS — E10621 Type 1 diabetes mellitus with foot ulcer: Secondary | ICD-10-CM | POA: Insufficient documentation

## 2016-07-28 DIAGNOSIS — Z992 Dependence on renal dialysis: Secondary | ICD-10-CM | POA: Insufficient documentation

## 2016-07-28 DIAGNOSIS — E1022 Type 1 diabetes mellitus with diabetic chronic kidney disease: Secondary | ICD-10-CM | POA: Diagnosis not present

## 2016-07-28 DIAGNOSIS — E1040 Type 1 diabetes mellitus with diabetic neuropathy, unspecified: Secondary | ICD-10-CM | POA: Diagnosis not present

## 2016-07-28 DIAGNOSIS — T8753 Necrosis of amputation stump, right lower extremity: Secondary | ICD-10-CM | POA: Insufficient documentation

## 2016-07-28 DIAGNOSIS — Y839 Surgical procedure, unspecified as the cause of abnormal reaction of the patient, or of later complication, without mention of misadventure at the time of the procedure: Secondary | ICD-10-CM | POA: Diagnosis not present

## 2016-07-28 DIAGNOSIS — Z794 Long term (current) use of insulin: Secondary | ICD-10-CM | POA: Insufficient documentation

## 2016-07-28 DIAGNOSIS — I132 Hypertensive heart and chronic kidney disease with heart failure and with stage 5 chronic kidney disease, or end stage renal disease: Secondary | ICD-10-CM | POA: Insufficient documentation

## 2016-07-28 DIAGNOSIS — D649 Anemia, unspecified: Secondary | ICD-10-CM | POA: Diagnosis not present

## 2016-07-28 DIAGNOSIS — Z6841 Body Mass Index (BMI) 40.0 and over, adult: Secondary | ICD-10-CM | POA: Insufficient documentation

## 2016-07-28 DIAGNOSIS — N186 End stage renal disease: Secondary | ICD-10-CM | POA: Insufficient documentation

## 2016-07-28 DIAGNOSIS — I70235 Atherosclerosis of native arteries of right leg with ulceration of other part of foot: Secondary | ICD-10-CM | POA: Insufficient documentation

## 2016-07-28 DIAGNOSIS — I509 Heart failure, unspecified: Secondary | ICD-10-CM | POA: Diagnosis not present

## 2016-07-28 DIAGNOSIS — J449 Chronic obstructive pulmonary disease, unspecified: Secondary | ICD-10-CM | POA: Diagnosis not present

## 2016-07-28 DIAGNOSIS — M1990 Unspecified osteoarthritis, unspecified site: Secondary | ICD-10-CM | POA: Diagnosis not present

## 2016-07-28 DIAGNOSIS — E785 Hyperlipidemia, unspecified: Secondary | ICD-10-CM | POA: Insufficient documentation

## 2016-07-28 DIAGNOSIS — L97513 Non-pressure chronic ulcer of other part of right foot with necrosis of muscle: Secondary | ICD-10-CM | POA: Insufficient documentation

## 2016-07-28 DIAGNOSIS — L97521 Non-pressure chronic ulcer of other part of left foot limited to breakdown of skin: Secondary | ICD-10-CM | POA: Insufficient documentation

## 2016-07-28 NOTE — Progress Notes (Addendum)
ZULEY, LUTTER ([id number]) Visit Report for 07/28/2016 Chief Complaint Document Details Patient Name: [name], [name] 07/28/2016 9:30 Date of Service: AM Medical Record [id number] Number: Patient Account Number: [id number] 05/26/1981 (35 y.o. Treating RN: Phillis Haggis Date of Birth/Sex: Female) Other Clinician: Primary Care Physician: [name] Treating Joie Reamer Referring Physician: Aldean Baker Physician/Extender: Tania Ade in Treatment: 0 Information Obtained from: Patient Chief Complaint Patients presents for treatment of an open diabetic ulcer to the medial part of her right forefoot where an amputation was done recently and ulcerated area on the left third and fourth toe for about 6 weeks Electronic Signature(s) Signed: 07/28/2016 10:48:13 AM By: Evlyn Kanner MD, FACS Entered By: Evlyn Kanner on 07/28/2016 10:48:13 [name] ([id number]) -------------------------------------------------------------------------------- HPI Details Patient Name: Beth, [name] 07/28/2016 9:30 Date of Service: AM Medical Record [id number] Number: Patient Account Number: [id number] May 13, 1981 (34 y.o. Treating RN: Phillis Haggis Date of Birth/Sex: Female) Other Clinician: Primary Care Physician: [name] Treating Gid Schoffstall Referring Physician: Aldean Baker Physician/Extender: Tania Ade in Treatment: 0 History of Present Illness Location: open wound right medial foot where recent amputation was done and skin ulcers on the left third and fourth toe Quality: Patient reports experiencing a sharp pain to affected area(s). Severity: Patient states wound are getting worse. Duration: Patient has had the wound for < 2 weeks prior to presenting for treatment Timing: Pain in wound is constant (hurts all the time) Context: The wound occurred when the patient had gangrene of the right first toe and was admitted to the hospital Modifying Factors: Other treatment(s) tried include:doxycycline  and Santyl ointment Associated Signs and Symptoms: Patient reports having foul odor. 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 She also is on hemodialysis Recently seen by Dr. Aldean Baker on 07/22/2016 a right great toe open wound and he had treated her for this with nitroglycerin patch changes daily and dressing for the foot with nonweightbearing and has asked her to return in 2 weeks. he had also put her on central dressings and doxycycline was prescribed for 2 weeks past surgical history is significant for amputation of the right great toe at the MTP joint on 06/25/2016 by Dr. [name]. Also status AV fistula placement, cesarean section, coronary angiogram, left third and fourth digit reconstruction surgery of her fingers, shoulder arthroscopy and tonsillectomy. She has never been a smoker. her MRI done on 06/22/2016 showed -- IMPRESSION: Ulceration on the distal great toe. Mild marrow edema in the tuft of the distal phalanx is worrisome for osteomyelitis. Negative for abscess or septic joint. as per Dr. Audrie Lia notes she had an amputation of the right great toe and partial amputation of the first metatarsal including the sesamoids as she had a diagnosis of gangrene, osteomyelitis and had failed conservative measures and elected for surgical management. Arterial duplex studies done on 06/21/2016 showed bilateral noncompressible arteries with the right pedal artery waveform appears abnormal with monophasic flow and the left lower extremity flow was triphasic. hemoglobin A1c on September 22 was 5.1% O St. recently was 4.6% a week ago Psychologist, [facility]) Signed: 07/28/2016 10:49:45 AM By: Evlyn Kanner MD, FACS Previous Signature: 07/28/2016 10:45:10 AM Version By: Evlyn Kanner MD, FACS Previous Signature: 07/28/2016 10:03:18 AM Version By: Evlyn Kanner MD, FACS Previous Signature: 07/28/2016 9:56:45 AM Version By: Evlyn Kanner  MD, Pearson Grippe, [name] ([id number]) Previous Signature: 07/28/2016 9:52:41 AM Version By: Evlyn Kanner MD, FACS Entered By: Evlyn Kanner on 07/28/2016 10:49:45 [name] ([id number]) -------------------------------------------------------------------------------- Physical Exam Details Patient Name: Beth, [name] 07/28/2016 9:30 Date of Service: AM Medical Record [id number] Number: Patient Account Number: [id number] 1981-05-18 (34 y.o. Treating RN: Phillis Haggis Date of Birth/Sex: Female) Other Clinician: Primary Care Physician: [name] Treating Evlyn Kanner Referring Physician: Aldean Baker Physician/Extender: Weeks in Treatment: 0 Constitutional . Pulse regular. Respirations normal and unlabored. Afebrile. . Eyes Nonicteric. Reactive to light. Ears, Nose, Mouth, and Throat Lips, teeth, and gums WNL.Marland Kitchen Moist mucosa without lesions. Neck supple and nontender. No palpable supraclavicular or cervical adenopathy. Normal sized without goiter. Respiratory WNL. No retractions.. Breath sounds WNL, No rubs, rales, rhonchi, or wheeze.. Cardiovascular Pedal Pulses WNL. non compressible blood vessels and recent arterial study reviewed.. No clubbing, cyanosis , has lymphedema of both lower extremities. Chest Breasts symmetical and no nipple discharge.. Breast tissue WNL, no masses, lumps, or tenderness.. Gastrointestinal (GI) Abdomen without masses or tenderness.. No liver or spleen enlargement or tenderness.. Lymphatic No adneopathy. No adenopathy. No adenopathy. Musculoskeletal Adexa without tenderness or enlargement.. Digits and nails w/o clubbing, cyanosis, infection, petechiae, ischemia, or inflammatory conditions.. Integumentary (Hair,  Skin) No suspicious lesions. No crepitus or fluctuance. No peri-wound warmth or erythema. No masses.Marland Kitchen Psychiatric Judgement and insight Intact.. No evidence of depression, anxiety, or agitation.. Notes the right medial foot in the region of her recent surgery has a open wound which has necrotic debris at the base and is extremely tender and minimal surrounding cellulitis. On the left third and fourth toes she has superficial abrasions on the dorsum with areas suggestive of superficial skin necrosis. Electronic Signature(s) CATHI, HAZAN ([id number]) Signed: 07/28/2016 10:52:25 AM By: Evlyn Kanner MD, FACS Entered By: Evlyn Kanner on 07/28/2016 10:52:25 [name] ([id number]) -------------------------------------------------------------------------------- Physician Orders Details Patient Name: SAMADHI, MAHURIN 07/28/2016 9:30 Date of Service: AM Medical Record [id number] Number: Patient Account Number: [id number] 11-06-1980 (34 y.o. Treating RN: Phillis Haggis Date of Birth/Sex: Female) Other Clinician: Primary Care Physician: [name] Treating Evlyn Kanner Referring Physician: Aldean Baker Physician/Extender: Tania Ade in Treatment: 0 Verbal / Phone Orders: Yes ClinicianAshok Cordia, Debi Read Back and Verified: Yes Diagnosis Coding Wound Cleansing Wound #1 Left,Dorsal Toe Fourth o Clean wound with Normal Saline. Wound #2 Left,Dorsal Toe Fifth o Clean wound with Normal Saline. Wound #3 Right Metatarsal head first o Clean wound with Normal Saline. Anesthetic Wound #1 Left,Dorsal Toe Fourth o Topical Lidocaine 4% cream applied to wound bed prior to debridement - for clinic use only Wound #2 Left,Dorsal Toe Fifth o Topical Lidocaine 4% cream applied to wound bed prior to debridement - for clinic use only Wound #3 Right Metatarsal head first o Topical Lidocaine 4% cream applied to wound bed prior to debridement - for clinic use only Primary Wound Dressing Wound #1  Left,Dorsal Toe Fourth o Other: - paint with betadine Wound #2 Left,Dorsal Toe Fifth o Other: - paint with betadine Wound #3 Right Metatarsal head first o Santyl Ointment Secondary Dressing Wound #3 Right Metatarsal head first o Gauze, ABD and Kerlix/Conform - tape, stretch netting #4 Lessner, Yitta ([id number]) Dressing Change Frequency Wound #1 Left,Dorsal Toe Fourth o Change dressing every day. Wound #2 Left,Dorsal Toe Fifth o Change dressing every day. Wound #3 Right Metatarsal head first o Change dressing every day. Follow-up Appointments Wound #1 Left,Dorsal Toe Fourth o Return Appointment in 1 week. Wound #2 Left,Dorsal Toe Fifth o Return Appointment in  1 week. Wound #3 Right Metatarsal head first o Return Appointment in 1 week. Edema Control Wound #1 Left,Dorsal Toe Fourth o Elevate legs to the level of the heart and pump ankles as often as possible Wound #2 Left,Dorsal Toe Fifth o Elevate legs to the level of the heart and pump ankles as often as possible Wound #3 Right Metatarsal head first o Elevate legs to the level of the heart and pump ankles as often as possible Off-Loading Wound #3 Right Metatarsal head first o Other: - Keep foot elevated as much as possible. Additional Orders / Instructions Wound #1 Left,Dorsal Toe Fourth o Increase protein intake. Wound #2 Left,Dorsal Toe Fifth o Increase protein intake. Wound #3 Right Metatarsal head first o Increase protein intake. Home Health Wound #1 Left,Dorsal Toe Fourth o Continue Home Health Visits - [facility] SOPHRONIA, VARNEY ([id number]) o Home Health Nurse may visit PRN to address patientos wound care needs. o FACE TO FACE ENCOUNTER: MEDICARE and MEDICAID PATIENTS: I certify that this patient is under my care and that I had a face-to-face encounter that meets the physician face-to-face encounter requirements with this patient on this date. The encounter with the patient was in whole  or in part for the following MEDICAL CONDITION: (primary reason for Home Healthcare) MEDICAL NECESSITY: I certify, that based on my findings, NURSING services are a medically necessary home health service. HOME BOUND STATUS: I certify that my clinical findings support that this patient is homebound (i.e., Due to illness or injury, pt requires aid of supportive devices such as crutches, cane, wheelchairs, walkers, the use of special transportation or the assistance of another person to leave their place of residence. There is a normal inability to leave the home and doing so requires considerable and taxing effort. Other absences are for medical reasons / religious services and are infrequent or of short duration when for other reasons). o If current dressing causes regression in wound condition, may D/C ordered dressing product/s and apply Normal Saline Moist Dressing daily until next Wound Healing Center / Other MD appointment. Notify Wound Healing Center of regression in wound condition at [phone]. o Please direct any NON-WOUND related issues/requests for orders to patient's Primary Care Physician Wound #2 Left,Dorsal Toe Fifth o Continue Home Health Visits - [facility] o Home Health Nurse may visit PRN to address patientos wound care needs. o FACE TO FACE ENCOUNTER: MEDICARE and MEDICAID PATIENTS: I certify that this patient is under my care and that I had a face-to-face encounter that meets the physician face-to-face encounter requirements with this patient on this date. The encounter with the patient was in whole or in part for the following MEDICAL CONDITION: (primary reason for Home Healthcare) MEDICAL NECESSITY: I certify, that based on my findings, NURSING services are a medically necessary home health service. HOME BOUND STATUS: I certify that my clinical findings support that this patient is homebound (i.e., Due to illness or injury, pt requires aid of supportive devices  such as crutches, cane, wheelchairs, walkers, the use of special transportation or the assistance of another person to leave their place of residence. There is a normal inability to leave the home and doing so requires considerable and taxing effort. Other absences are for medical reasons / religious services and are infrequent or of short duration when for other reasons). o If current dressing causes regression in wound condition, may D/C ordered dressing product/s and apply Normal Saline Moist Dressing daily until next Wound Healing Center / Other MD appointment. Notify  Wound Healing Center of regression in wound condition at [phone]. o Please direct any NON-WOUND related issues/requests for orders to patient's Primary Care Physician Wound #3 Right Metatarsal head first o Continue Home Health Visits - [facility] o Home Health Nurse may visit PRN to address patientos wound care needs. o FACE TO FACE ENCOUNTER: MEDICARE and MEDICAID PATIENTS: I certify that this patient is under my care and that I had a face-to-face encounter that meets the physician face-to-face encounter requirements with this patient on this date. The encounter with the patient was in whole or in part for the following MEDICAL CONDITION: (primary reason for Home Healthcare) MEDICAL NECESSITY: I certify, that based on my findings, NURSING services are a medically necessary home health service. HOME BOUND STATUS: I certify that my clinical findings support that this patient is homebound (i.e., Due to illness or injury, pt requires aid of MAAHI, BAYHA ([id number]) supportive devices such as crutches, cane, wheelchairs, walkers, the use of special transportation or the assistance of another person to leave their place of residence. There is a normal inability to leave the home and doing so requires considerable and taxing effort. Other absences are for medical reasons / religious services and are infrequent or of short  duration when for other reasons). o If current dressing causes regression in wound condition, may D/C ordered dressing product/s and apply Normal Saline Moist Dressing daily until next Wound Healing Center / Other MD appointment. Notify Wound Healing Center of regression in wound condition at [phone]. o Please direct any NON-WOUND related issues/requests for orders to patient's Primary Care Physician Medications-please add to medication list. Wound #1 Left,Dorsal Toe Fourth o Santyl Enzymatic Ointment o Other: - Vitamin A, Vitamin C, Zinc Wound #2 Left,Dorsal Toe Fifth o Santyl Enzymatic Ointment Wound #3 Right Metatarsal head first o Santyl Enzymatic Ointment Consults o Vascular - Dr. Durwin Nora in [place] o Infectious Disease - [facility] o X-ray, foot - Bilateral feet Electronic Signature(s) Signed: 07/28/2016 3:58:21 PM By: Evlyn Kanner MD, FACS Signed: 07/28/2016 4:46:11 PM By: Alejandro Mulling Entered By: Alejandro Mulling on 07/28/2016 12:11:42 [name] ([id number]) -------------------------------------------------------------------------------- Problem List Details Patient Name: MAKYRA, LEIDING 07/28/2016 9:30 Date of Service: AM Medical Record [id number] Number: Patient Account Number: [id number] 01/02/1981 (34 y.o. 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 L97.521 Non-pressure chronic ulcer of other part of left foot  limited 07/28/2016 Yes to breakdown of skin Z99.2 Dependence on renal dialysis 07/28/2016 Yes T87.53 Necrosis of amputation stump, right lower extremity 07/28/2016 Yes Inactive Problems Resolved Problems Electronic Signature(s) Signed: 07/28/2016 10:53:37 AM By: Evlyn Kanner MD, FACS Previous Signature: 07/28/2016 10:47:29 AM Version By: Evlyn Kanner MD, FACS Entered By: Evlyn Kanner on 07/28/2016 10:53:37 [name] ([id number]) -------------------------------------------------------------------------------- Progress Note Details Patient Name: TENIKA, ARCARI 07/28/2016 9:30 Date of Service: AM Medical Record [id number] Number: Patient Account Number: [id number] 09/12/1981 (34 y.o. Treating RN: Phillis Haggis Date of Birth/Sex: Female) Other Clinician: Primary Care Physician: [name] Treating Evlyn Kanner Referring Physician: Aldean Baker Physician/Extender: Tania Ade in Treatment: 0 Subjective Chief Complaint Information obtained from Patient Patients presents for treatment  of an open diabetic ulcer to the medial part of her right forefoot where an amputation was done recently and ulcerated area on the left third and fourth toe for about 6 weeks History of Present Illness (HPI) The following HPI elements were documented for the patient's wound: Location: open wound right medial foot where recent amputation was done and skin ulcers on the left third and fourth toe Quality: Patient reports experiencing a sharp pain to affected area(s). Severity: Patient states wound are getting worse. Duration: Patient has had the wound for < 2 weeks prior to presenting for treatment Timing: Pain in wound is constant (hurts all the time) Context: The wound occurred when the patient had gangrene of the right first toe and was admitted to the hospital Modifying Factors: Other treatment(s) tried include:doxycycline and Santyl ointment Associated Signs and Symptoms: Patient reports having  foul odor. 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 She also is on hemodialysis Recently seen by Dr. Aldean BakerMarcus Duda on 07/22/2016 a right great toe open wound and he had treated her for this with nitroglycerin patch changes daily and dressing for the foot with nonweightbearing and has asked her to return in 2 weeks. he had also put her on central dressings and doxycycline was prescribed for 2 weeks past surgical history is significant for amputation of the right great toe at the MTP joint on 06/25/2016 by Dr. Lajoyce Cornersuda. Also status AV fistula placement, cesarean section, coronary angiogram, left third and fourth digit reconstruction surgery of her fingers, shoulder arthroscopy and tonsillectomy. She has never been a smoker. her MRI done on 06/22/2016 showed -- IMPRESSION: Ulceration on the distal great toe. Mild marrow edema in the tuft of the distal phalanx is worrisome for osteomyelitis. Negative for abscess or septic joint. as per Dr. Audrie Liauda's notes she had an amputation of the right great toe and partial amputation of the first metatarsal including the sesamoids as she had a diagnosis of gangrene, osteomyelitis and had failed conservative measures and elected for surgical management. Sharolyn DouglasGAY, Ryann ([id number]) Arterial duplex studies done on 06/21/2016 showed bilateral noncompressible arteries with the right pedal artery waveform appears abnormal with monophasic flow and the left lower extremity flow was triphasic. hemoglobin A1c on September 22 was 5.1% O St. recently was 4.6% a week ago Wound History Patient presents with 2 open wounds that have been present for approximately 05/30/16. Patient has been treating wounds in  the following manner: bacitracin, santyl. Laboratory tests have not been performed in the last month. Patient reportedly has not tested positive for an antibiotic resistant organism. Patient reportedly has tested positive for osteomyelitis. Patient reportedly has not had testing performed to evaluate circulation in the legs. Patient experiences the following problems associated with their wounds: infection, swelling. Patient History Information obtained from Patient. Allergies sulfur, asprin, tramadol Family History Cancer - Father, Diabetes - Mother, Maternal Grandparents, Hypertension - Father, Lung Disease - Father, Stroke - Mother, Thyroid Problems - Mother, No family history of Heart Disease, Hereditary Spherocytosis, Kidney Disease, Seizures, Tuberculosis. Social History Never smoker, Marital Status - Single, Alcohol Use - Never, Drug Use - No History, Caffeine Use - Never. Medical History Hematologic/Lymphatic Patient has history of Anemia Respiratory Patient has history of Asthma, Chronic Obstructive Pulmonary Disease (COPD) Cardiovascular Patient has history of Congestive Heart  Failure, Hypertension, Myocardial Infarction Endocrine Patient has history of Type I Diabetes Denies history of Type II Diabetes Genitourinary Patient has history of End Stage Renal Disease Musculoskeletal Patient has history of Osteoarthritis Neurologic Patient has history of Neuropathy Patient is treated with Insulin. Blood sugar is tested. Review of Systems (ROS) Constitutional Symptoms (General Health) The patient has no complaints or symptoms. Eyes The patient has no complaints or symptoms. MAILYN, STEICHEN ([id number]) Ear/Nose/Mouth/Throat The patient has no complaints or symptoms. Hematologic/Lymphatic lower extremity edema Respiratory hx respiratory failure Cardiovascular cardiomyopathy morbid obesirty hyperlipidemia Gastrointestinal The patient has no complaints or  symptoms. Genitourinary dialysis pt Immunological The patient has no complaints or symptoms. Integumentary (Skin) Complains or has symptoms of Wounds - diabetic foot infection, cellulitis right lower extremity cellulitis right foot Oncologic The patient has no complaints or symptoms. Psychiatric The patient has no complaints or symptoms. Medications carvedilol 25 mg tablet oral 1 1 tablet oral BID Lyrica 50 mg capsule oral 1 1 capsule oral TID Renagel 800 mg tablet oral 1 1 tablet oral with meals and snacks Novolog 100 unit/mL subcutaneous solution subcutaneous 1 10 units SQ with meals duloxetine 30 mg capsule,delayed release oral 1 1 capsule,delayed release(DR/EC) oral doxycycline hyclate 100 mg tablet oral 1 1 tablet oral TID Objective Constitutional Pulse regular. Respirations normal and unlabored. Afebrile. Vitals Time Taken: 9:40 AM, Height: 67 in, Source: Stated, Weight: 262 lbs, Source: Stated, BMI: 41, Temperature: 97.6 F, Pulse: 83 bpm, Respiratory Rate: 20 breaths/min, Blood Pressure: 121/77 mmHg. Eyes Nonicteric. Reactive to light. EARLE, BURSON ([id number]) Ears, Nose, Mouth, and Throat Lips, teeth, and gums WNL.Marland Kitchen Moist mucosa without lesions. Neck supple and nontender. No palpable supraclavicular or cervical adenopathy. Normal sized without goiter. Respiratory WNL. No retractions.. Breath sounds WNL, No rubs, rales, rhonchi, or wheeze.. Cardiovascular Pedal Pulses WNL. non compressible blood vessels and recent arterial study reviewed.. No clubbing, cyanosis , has lymphedema of both lower extremities. Chest Breasts symmetical and no nipple discharge.. Breast tissue WNL, no masses, lumps, or tenderness.. Gastrointestinal (GI) Abdomen without masses or tenderness.. No liver or spleen enlargement or tenderness.. Lymphatic No adneopathy. No adenopathy. No adenopathy. Musculoskeletal Adexa without tenderness or enlargement.. Digits and nails w/o clubbing, cyanosis,  infection, petechiae, ischemia, or inflammatory conditions.Marland Kitchen Psychiatric Judgement and insight Intact.. No evidence of depression, anxiety, or agitation.. General Notes: the right medial foot in the region of her recent surgery has a open wound which has necrotic debris at the base and is extremely tender and minimal surrounding cellulitis. On the left third and fourth toes she has superficial abrasions on the dorsum with areas suggestive of superficial skin necrosis. Integumentary (Hair, Skin) No suspicious lesions. No crepitus or fluctuance. No peri-wound warmth or erythema. No masses.. Wound #1 status is Open. Original cause of wound was Gradually Appeared. The wound is located on the Left,Dorsal Toe Fourth. The wound measures 0.5cm length x 0.9cm width x 0.1cm depth; 0.353cm^2 area and 0.035cm^3 volume. The wound is limited to skin breakdown. There is no tunneling or undermining noted. There is a small amount of serous drainage noted. The wound margin is distinct with the outline attached to the wound base. There is no granulation within the wound bed. There is a large (67-100%) amount of necrotic tissue within the wound bed including Eschar. The periwound skin appearance exhibited: Erythema. The surrounding wound skin color is noted with erythema which is circumferential. Periwound temperature was noted as No Abnormality. Wound #2 status is Open. Original cause of wound was Gradually Appeared.  The wound is located on the Left,Dorsal Toe Fifth. The wound measures 1.6cm length x 1.1cm width x 0.1cm depth; 1.382cm^2 area and 0.138cm^3 volume. The wound is limited to skin breakdown. There is no tunneling or undermining noted. There is a small amount of serous drainage noted. The wound margin is distinct with the outline attached to the wound base. There is no granulation within the wound bed. There is a large (67-100%) Hebenstreit, Jaydon ([id number]) amount of necrotic tissue within the wound bed  including Eschar. The periwound skin appearance exhibited: Erythema. The surrounding wound skin color is noted with erythema which is circumferential. Periwound temperature was noted as No Abnormality. The periwound has tenderness on palpation. Wound #3 status is Open. Original cause of wound was Surgical Injury. The wound is located on the Right Metatarsal head first. The wound measures 1.3cm length x 5cm width x 1cm depth; 5.105cm^2 area and 5.105cm^3 volume. There is fat and fascia exposed. There is no tunneling or undermining noted. There is a large amount of purulent drainage noted. There is no granulation within the wound bed. There is a large (67-100%) amount of necrotic tissue within the wound bed including Eschar and Adherent Slough. The periwound skin appearance exhibited: Localized Edema, Maceration, Moist, Erythema. The surrounding wound skin color is noted with erythema which is circumferential. Periwound temperature was noted as No Abnormality. The periwound has tenderness on palpation. 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 She is still under the care of the orthopedic surgeon Dr. [name] and I have asked her to see him in follow-up regarding possible revision debridement and application of a  wound VAC. After review of also recommended: 1. Vascular surgery consult with Dr. Edilia Bo, for possible surgical options 2. Infectious disease consult for possible IV antibiotics and appropriate care 3. Continue with Santyl ointment locally to be changed on a daily basis 4. She is already on oral doxycycline -- continue till she see's infectious disease 5. x-ray of left and right feet. 6. After complete optimization if she continues to have problems with her diabetic foot ulcer she may benefit from hyperbaric oxygen therapy and have briefly discussed this with her. Plan Wound Cleansing: TAIYA, NUTTING ([id number]) Wound #1 Left,Dorsal Toe Fourth: Clean wound with Normal Saline. Wound #2 Left,Dorsal Toe Fifth: Clean wound with Normal Saline. Wound #3 Right Metatarsal head first: Clean wound with Normal Saline. Anesthetic: Wound #1 Left,Dorsal Toe Fourth: Topical Lidocaine 4% cream applied to wound bed prior to debridement - for clinic use only Wound #2 Left,Dorsal Toe Fifth: Topical Lidocaine 4% cream applied to wound bed prior to debridement - for clinic use only Wound #3 Right Metatarsal head first: Topical Lidocaine 4% cream applied to wound bed prior to debridement - for clinic use only Primary Wound Dressing: Wound #1 Left,Dorsal Toe Fourth: Other: - paint with betadine Wound #  2 Left,Dorsal Toe Fifth: Other: - paint with betadine Wound #3 Right Metatarsal head first: Santyl Ointment Secondary Dressing: Wound #3 Right Metatarsal head first: Gauze, ABD and Kerlix/Conform - tape, stretch netting #4 Dressing Change Frequency: Wound #1 Left,Dorsal Toe Fourth: Change dressing every day. Wound #2 Left,Dorsal Toe Fifth: Change dressing every day. Wound #3 Right Metatarsal head first: Change dressing every day. Follow-up Appointments: Wound #1 Left,Dorsal Toe Fourth: Return Appointment in 1 week. Wound #2 Left,Dorsal Toe Fifth: Return Appointment in 1 week. Wound #3 Right  Metatarsal head first: Return Appointment in 1 week. Edema Control: Wound #1 Left,Dorsal Toe Fourth: Elevate legs to the level of the heart and pump ankles as often as possible Wound #2 Left,Dorsal Toe Fifth: Elevate legs to the level of the heart and pump ankles as often as possible Wound #3 Right Metatarsal head first: Elevate legs to the level of the heart and pump ankles as often as possible Off-Loading: Wound #3 Right Metatarsal head first: Other: - Keep foot elevated as much as possible. Additional Orders / Instructions: Wound #1 Left,Dorsal Toe Fourth: Increase protein intake. Wound #2 Left,Dorsal Toe FifthKENNADIE, BRENNER ([id number]) Increase protein intake. Wound #3 Right Metatarsal head first: Increase protein intake. Home Health: Wound #1 Left,Dorsal Toe Fourth: Continue Home Health Visits - [facility] Home Health Nurse may visit PRN to address patient s wound care needs. FACE TO FACE ENCOUNTER: MEDICARE and MEDICAID PATIENTS: I certify that this patient is under my care and that I had a face-to-face encounter that meets the physician face-to-face encounter requirements with this patient on this date. The encounter with the patient was in whole or in part for the following MEDICAL CONDITION: (primary reason for Home Healthcare) MEDICAL NECESSITY: I certify, that based on my findings, NURSING services are a medically necessary home health service. HOME BOUND STATUS: I certify that my clinical findings support that this patient is homebound (i.e., Due to illness or injury, pt requires aid of supportive devices such as crutches, cane, wheelchairs, walkers, the use of special transportation or the assistance of another person to leave their place of residence. There is a normal inability to leave the home and doing so requires considerable and taxing effort. Other absences are for medical reasons / religious services and are infrequent or of short duration when for other reasons). If  current dressing causes regression in wound condition, may D/C ordered dressing product/s and apply Normal Saline Moist Dressing daily until next Wound Healing Center / Other MD appointment. Notify Wound Healing Center of regression in wound condition at [phone]. Please direct any NON-WOUND related issues/requests for orders to patient's Primary Care Physician Wound #2 Left,Dorsal Toe Fifth: Continue Home Health Visits - [facility] Home Health Nurse may visit PRN to address patient s wound care needs. FACE TO FACE ENCOUNTER: MEDICARE and MEDICAID PATIENTS: I certify that this patient is under my care and that I had a face-to-face encounter that meets the physician face-to-face encounter requirements with this patient on this date. The encounter with the patient was in whole or in part for the following MEDICAL CONDITION: (primary reason for Home Healthcare) MEDICAL NECESSITY: I certify, that based on my findings, NURSING services are a medically necessary home health service. HOME BOUND STATUS: I certify that my clinical findings support that this patient is homebound (i.e., Due to illness or injury, pt requires aid of supportive devices such as crutches, cane, wheelchairs, walkers, the use of special transportation or the assistance of another person to leave their  place of residence. There is a normal inability to leave the home and doing so requires considerable and taxing effort. Other absences are for medical reasons / religious services and are infrequent or of short duration when for other reasons). If current dressing causes regression in wound condition, may D/C ordered dressing product/s and apply Normal Saline Moist Dressing daily until next Wound Healing Center / Other MD appointment. Notify Wound Healing Center of regression in wound condition at [phone]. Please direct any NON-WOUND related issues/requests for orders to patient's Primary Care Physician Wound #3 Right Metatarsal  head first: Continue Home Health Visits - [facility] Home Health Nurse may visit PRN to address patient s wound care needs. FACE TO FACE ENCOUNTER: MEDICARE and MEDICAID PATIENTS: I certify that this patient is under my care and that I had a face-to-face encounter that meets the physician face-to-face encounter requirements with this patient on this date. The encounter with the patient was in whole or in part for the following MEDICAL CONDITION: (primary reason for Home Healthcare) MEDICAL NECESSITY: I certify, that based on my findings, NURSING services are a medically necessary home health service. HOME BOUND STATUS: I certify that my clinical findings support that this patient is homebound (i.e., Due to illness or injury, pt requires aid of supportive devices such as crutches, cane, wheelchairs, walkers, the use of special transportation or the assistance of another person to leave their place of residence. There is a normal inability to leave the home and doing so requires considerable and taxing effort. Other absences are for medical reasons / religious services and are infrequent or of short duration when for other reasons). DASIE, CHANCELLOR ([id number]) If current dressing causes regression in wound condition, may D/C ordered dressing product/s and apply Normal Saline Moist Dressing daily until next Wound Healing Center / Other MD appointment. Notify Wound Healing Center of regression in wound condition at [phone]. Please direct any NON-WOUND related issues/requests for orders to patient's Primary Care Physician Medications-please add to medication list.: Wound #1 Left,Dorsal Toe Fourth: Santyl Enzymatic Ointment Other: - Vitamin A, Vitamin C, Zinc Wound #2 Left,Dorsal Toe Fifth: Santyl Enzymatic Ointment Wound #3 Right Metatarsal head first: Santyl Enzymatic Ointment Consults ordered were: Vascular - Dr. 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 She is still under the care of the orthopedic surgeon Dr. [name] and I have asked her to see him in follow-up regarding possible revision debridement and application of a wound VAC. After review of also recommended: 1. Vascular surgery consult with Dr. Edilia Bo, for possible surgical options 2. Infectious disease consult for possible IV antibiotics and appropriate care 3. Continue with Santyl ointment locally to be changed on a daily basis 4. She is already on oral doxycycline -- continue till she see's infectious disease 5. x-ray of left and right feet. 6. After complete optimization if she continues to have problems with her diabetic foot ulcer she may benefit from hyperbaric oxygen therapy and have briefly discussed this with her. Electronic Signature(s) Signed: 07/28/2016 4:03:42 PM By: Evlyn Kanner MD, FACS Previous Signature: 07/28/2016 11:00:24 AM Version By: Evlyn Kanner MD, FACS Entered By: Evlyn Kanner on 07/28/2016 16:03:42 [name] ([id number]) -------------------------------------------------------------------------------- ROS/PFSH Details  Patient Name: ANASIA, AGRO 07/28/2016 9:30 Date of Service: AM Medical Record [id number] Number: Patient Account Number: [id number] 24-Jul-1981 (35 y.o. Treating RN: Phillis Haggis Date of Birth/Sex: Female) Other Clinician: Primary Care Physician: [name] Treating Evlyn Kanner Referring Physician: Aldean Baker Physician/Extender: Tania Ade in Treatment: 0 Information Obtained From Patient Wound History Do you currently have one or more open woundso Yes How many open wounds do you currently haveo 2 Approximately how long have you  had your woundso 05/30/16 How have you been treating your wound(s) until nowo bacitracin, santyl Has your wound(s) ever healed and then re-openedo No Have you had any lab work done in the past montho No Have you tested positive for an antibiotic resistant organism (MRSA, VRE)o No Have you tested positive for osteomyelitis (bone infection)o Yes Have you had any tests for circulation on your legso No Have you had other problems associated with your woundso Infection, Swelling Integumentary (Skin) Complaints and Symptoms: Positive for: Wounds - diabetic foot infection Review of System Notes: cellulitis right lower extremity cellulitis right foot Constitutional Symptoms (General Health) Complaints and Symptoms: No Complaints or Symptoms Eyes Complaints and Symptoms: No Complaints or Symptoms Ear/Nose/Mouth/Throat Complaints and Symptoms: No Complaints or Symptoms Hematologic/Lymphatic ADEAN, MILOSEVIC ([id number]) Complaints and Symptoms: Review of System Notes: lower extremity edema Medical History: Positive for: Anemia Respiratory Complaints and Symptoms: Review of System Notes: hx respiratory failure Medical History: Positive for: Asthma; Chronic Obstructive Pulmonary Disease (COPD) Cardiovascular Complaints and Symptoms: Review of System Notes: cardiomyopathy morbid obesirty hyperlipidemia Medical History: Positive for: Congestive Heart Failure; Hypertension; Myocardial Infarction Gastrointestinal Complaints and Symptoms: No Complaints or Symptoms Endocrine Medical History: Positive for: Type I Diabetes Negative for: Type II Diabetes Time with diabetes: since she was 9 Treated with: Insulin Blood sugar tested every day: Yes Tested : 3x a day with meals Genitourinary Complaints and Symptoms: Review of System Notes: dialysis pt Medical History: Positive for: End Stage Renal Disease LILIE, VEZINA ([id number]) Immunological Complaints and Symptoms: No Complaints or  Symptoms Musculoskeletal Medical History: Positive for: Osteoarthritis Neurologic Medical History: Positive for: Neuropathy Oncologic Complaints and Symptoms: No Complaints or Symptoms Psychiatric Complaints and Symptoms: No Complaints or Symptoms Immunizations Immunization Notes: pt states that she does not get the PNA or flu shots per pt Family and Social History Cancer: Yes - Father; Diabetes: Yes - Mother, Maternal Grandparents; Heart Disease: No; Hereditary Spherocytosis: No; Hypertension: Yes - Father; Kidney Disease: No; Lung Disease: Yes - Father; Seizures: No; Stroke: Yes - Mother; Thyroid Problems: Yes - Mother; Tuberculosis: No; Never smoker; Marital Status - Single; Alcohol Use: Never; Drug Use: No History; Caffeine Use: Never; Financial Concerns: No; Food, Clothing or Shelter Needs: No; Support System Lacking: No; Transportation Concerns: No; Advanced Directives: No; Patient does not want information on Advanced Directives; Do not resuscitate: No; Living Will: No; Medical Power of Attorney: No Physician Affirmation I have reviewed and agree with the above information. Electronic Signature(s) Signed: 07/28/2016 3:58:21 PM By: Evlyn Kanner MD, FACS Signed: 07/28/2016 4:46:11 PM By: Alejandro Mulling Entered By: Evlyn Kanner on 07/28/2016 10:45:34 [name] ([id number]) -------------------------------------------------------------------------------- SuperBill Details Patient Name: [name] Date of Service: 07/28/2016 Medical Record Number: [id number] Patient Account Number: [id number] Date of Birth/Sex: [birth year] (34 y.o. Female) Treating RN: Phillis Haggis Primary Care Physician: [name] Other Clinician: Referring Physician: DUDA, Berna Spare Treating Physician/Extender: Rudene Re in Treatment: 0 Diagnosis Coding ICD-10 Codes Code Description E10.621 Type 1 diabetes mellitus with foot ulcer I70.235 Atherosclerosis of native arteries of right  leg  with ulceration of other part of foot L97.513 Non-pressure chronic ulcer of other part of right foot with necrosis of muscle L97.521 Non-pressure chronic ulcer of other part of left foot limited to breakdown of skin Z99.2 Dependence on renal dialysis T87.53 Necrosis of amputation stump, right lower extremity Facility Procedures CPT4 Code: [id number] Description: 30865 - WOUND CARE VISIT-LEV 5 EST PT Modifier: Quantity: 1 Physician Procedures CPT4: Description Modifier Quantity Code [id number] - WC PHYS LEVEL 4 - NEW PT 1 ICD-10 Description Diagnosis E10.621 Type 1 diabetes mellitus with foot ulcer I70.235 Atherosclerosis of native arteries of right leg with ulceration of other part of  foot L97.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

## 2016-07-29 ENCOUNTER — Other Ambulatory Visit: Payer: Self-pay

## 2016-07-29 DIAGNOSIS — L98499 Non-pressure chronic ulcer of skin of other sites with unspecified severity: Secondary | ICD-10-CM

## 2016-07-29 NOTE — Progress Notes (Signed)
RANDY, RITA ([id number]) Visit Report for 07/28/2016 Abuse/Suicide Risk Screen Details Patient Name: [name], [name] 07/28/2016 9:30 Date of Service: AM Medical Record [id number] Number: Patient Account Number: [id number] 1981-07-04 (35 y.o. Treating RN: Phillis Haggis Date of Birth/Sex: Female) Other Clinician: Primary Care Physician: [name] Treating Britto, Errol Referring Physician: Aldean Baker Physician/Extender: Tania Ade in Treatment: 0 Abuse/Suicide Risk Screen Items Answer ABUSE/SUICIDE RISK SCREEN: Has anyone close to you tried to hurt or harm you recentlyo No Do you feel uncomfortable with anyone in your familyo No Has anyone forced you do things that you didnot want to doo No Do you have any thoughts of harming yourselfo No Patient displays signs or symptoms of abuse and/or neglect. No Electronic Signature(s) Signed: 07/28/2016 4:46:11 PM By: Alejandro Mulling Entered By: Alejandro Mulling on 07/28/2016 09:58:13 [name] ([id number]) -------------------------------------------------------------------------------- Activities of Daily Living Details Patient Name: [name], [name] 07/28/2016 9:30 Date of Service: AM Medical Record [id number] Number: Patient Account Number: [id number] 04-Mar-1981 (35 y.o. Treating RN: Phillis Haggis Date of Birth/Sex: Female) Other Clinician: Primary Care Physician: [name] Treating Evlyn Kanner Referring Physician: Aldean Baker Physician/Extender: Tania Ade in Treatment: 0 Activities of Daily Living Items Answer Activities of Daily Living (Please select one for each item) Drive Automobile Not Able Take Medications Completely Able Use Telephone Completely Able Care for Appearance Completely Able Use Toilet Completely Able Bath / Shower Completely Able Dress Self Completely Able Feed Self Completely Able Walk Completely Able Get In / Out Bed Completely Able Housework Need Assistance Prepare Meals Need Assistance Handle  Money Completely Able Shop for Self Need Assistance Electronic Signature(s) Signed: 07/28/2016 4:46:11 PM By: Alejandro Mulling Entered By: Alejandro Mulling on 07/28/2016 09:58:54 [name] ([id number]) -------------------------------------------------------------------------------- Education Assessment Details Patient Name: [name], [name] 07/28/2016 9:30 Date of Service: AM Medical Record [id number] Number: Patient Account Number: [id number] August 21, 1981 (34 y.o. Treating RN: Phillis Haggis Date of Birth/Sex: Female) Other Clinician: Primary Care Physician: [name] Treating Evlyn Kanner Referring Physician: Aldean Baker Physician/Extender: Tania Ade in Treatment: 0 Learning Preferences/Education Level/Primary Language Learning Preference: Explanation, Printed Material Highest Education Level: College or Above Preferred Language: English Cognitive Barrier Assessment/Beliefs Language Barrier: No Translator Needed: No Memory Deficit: No Emotional Barrier: No Cultural/Religious Beliefs Affecting Medical No Care: Physical Barrier Assessment Impaired Vision: No Impaired Hearing: No Decreased Hand dexterity: No Knowledge/Comprehension Assessment Knowledge Level: High Comprehension Level: High Ability to understand written High instructions: Ability to understand verbal High instructions: Motivation Assessment Anxiety Level: Calm Cooperation: Cooperative Education Importance: Acknowledges Need Interest in Health Problems: Asks Questions Perception: Coherent Willingness to Engage in Self- High Management Activities: Readiness to Engage in Self- High Management Activities: [name], [name] ([id number]) Electronic Signature(s) Signed: 07/28/2016 4:46:11 PM By: Alejandro Mulling Entered By: Alejandro Mulling on 07/28/2016 09:59:16 [name] ([id number]) -------------------------------------------------------------------------------- Fall Risk Assessment Details Patient  Name: [name], [name] 07/28/2016 9:30 Date of Service: AM Medical Record [id number] Number: Patient Account Number: [id number] 02/11/1981 (34 y.o. Treating RN: Phillis Haggis Date of Birth/Sex: Female) Other Clinician: Primary Care Physician: [name] Treating Evlyn Kanner Referring Physician: Aldean Baker Physician/Extender: Tania Ade in Treatment: 0 Fall Risk Assessment Items Have you had 2 or more falls in the last 12 monthso 0 Yes Have you had any fall that resulted in injury in the last 12 monthso 0 Yes FALL RISK ASSESSMENT: History of falling - immediate or within 3 months 25 Yes Secondary diagnosis 15 Yes Ambulatory aid None/bed rest/wheelchair/nurse 0 Yes Crutches/cane/walker 15 Yes Furniture 0 No IV Access/Saline Lock 0 No Gait/Training Normal/bed rest/immobile 0 No  Weak 10 Yes Impaired 20 Yes Mental Status Oriented to own ability 0 Yes Electronic Signature(s) Signed: 07/28/2016 4:46:11 PM By: Alejandro MullingPinkerton, Debra Entered By: Alejandro MullingPinkerton, Debra on 07/28/2016 10:00:00 [name], [name] ([id number]) -------------------------------------------------------------------------------- Foot Assessment Details Patient Name: [name], [name] 07/28/2016 9:30 Date of Service: AM Medical Record [id number] Number: Patient Account Number: [id number] Aug 28, 1981 (34 y.o. Treating RN: Phillis HaggisPinkerton, Debi Date of Birth/Sex: Female) Other Clinician: Primary Care Physician: Elizabeth PalauANDERSON, TERESA Treating Britto, Errol Referring Physician: Aldean BakerUDA, MARCUS Physician/Extender: Tania AdeWeeks in Treatment: 0 Foot Assessment Items Site Locations + = Sensation present, - = Sensation absent, C = Callus, U = Ulcer R = Redness, W = Warmth, M = Maceration, PU = Pre-ulcerative lesion F = Fissure, S = Swelling, D = Dryness Assessment Right: Left: Other Deformity: No No Prior Foot Ulcer: No No Prior Amputation: No No Charcot Joint: No No Ambulatory Status: Ambulatory With Help Assistance Device: Walker Gait:  Steady Electronic Signature(s) Signed: 07/28/2016 4:46:11 PM By: Alejandro MullingPinkerton, Debra Entered By: Alejandro MullingPinkerton, Debra on 07/28/2016 10:04:28 [name], Indea ([id number] [name]) [name], [name] ([id number]) -------------------------------------------------------------------------------- Nutrition Risk Assessment Details Patient Name: [name], [name] 07/28/2016 9:30 Date of Service: AM Medical Record [id number] Number: Patient Account Number: [id number] Aug 28, 1981 (34 y.o. Treating RN: Phillis HaggisPinkerton, Debi Date of Birth/Sex: Female) Other Clinician: Primary Care Physician: Elizabeth PalauANDERSON, TERESA Treating Britto, Errol Referring Physician: DUDA, MARCUS Physician/Extender: Tania AdeWeeks in Treatment: 0 Height (in): 67 Weight (lbs): 262 Body Mass Index (BMI): 41 Nutrition Risk Assessment Items NUTRITION RISK SCREEN: I have an illness or condition that made me change the kind and/or 2 Yes amount of food I eat I eat fewer than two meals per day 0 No I eat few fruits and vegetables, or milk products 0 No I have three or more drinks of beer, liquor or wine almost every day 0 No I have tooth or mouth problems that make it hard for me to eat 0 No I don't always have enough money to buy the food I need 0 No I eat alone most of the time 0 No I take three or more different prescribed or over-the-counter drugs a 1 Yes day Without wanting to, I have lost or gained 10 pounds in the last six 0 No months I am not always physically able to shop, cook and/or feed myself 0 No Nutrition Protocols Good Risk Protocol Moderate Risk Protocol Electronic Signature(s) Signed: 07/28/2016 4:46:11 PM By: Alejandro MullingPinkerton, Debra Entered By: Alejandro MullingPinkerton, Debra on 07/28/2016 10:00:19

## 2016-07-29 NOTE — Progress Notes (Signed)
[name] ([id number]) Visit Report for 07/28/2016 Allergy List Details Patient Name: [name], [name] Date of Service: 07/28/2016 9:30 AM Medical Record Number: [id number] Patient Account Number: [id number] Date of Birth/Sex: [birth year] (34 y.o. Female) Treating RN: Ahmed Prima Primary Care Physician: Vicenta Aly Other Clinician: Referring Physician: DUDA, MARCUS Treating Physician/Extender: Frann Rider in Treatment: 0 Allergies Active Allergies sulfur asprin tramadol Allergy Notes Electronic Signature(s) Signed: 07/28/2016 4:46:11 PM By: Alric Quan Entered By: Alric Quan on 07/28/2016 09:44:44 [name] ([id number]) -------------------------------------------------------------------------------- Arrival Information Details Patient Name: [name] Date of Service: 07/28/2016 9:30 AM Medical Record Number: [id number] Patient Account Number: [id number] Date of Birth/Sex: 1980-12-26 (34 y.o. Female) Treating RN: Carolyne Fiscal, Debi Primary Care Physician: Vicenta Aly Other Clinician: Referring Physician: DUDA, MARCUS Treating Physician/Extender: Frann Rider in Treatment: 0 Visit Information Patient Arrived: Wheel Chair Arrival Time: 09:38 Accompanied By: self Transfer Assistance: EasyPivot Patient Lift Patient Identification Verified: Yes Secondary Verification Process Yes Completed: Patient Requires Transmission- No Based Precautions: Patient Has Alerts: Yes Patient Alerts: DM II R leg non- compressible L leg non- compressible Electronic Signature(s) Signed: 07/28/2016 4:46:11 PM By: Alric Quan Entered By: Alric Quan on 07/28/2016 10:15:21 [name] ([id number]) -------------------------------------------------------------------------------- Clinic Level of Care Assessment Details Patient Name: [name] Date of Service: 07/28/2016 9:30 AM Medical Record Number: [id number] Patient Account Number: [id number] Date of  Birth/Sex: March 18, 1981 (34 y.o. Female) Treating RN: Ahmed Prima Primary Care Physician: Vicenta Aly Other Clinician: Referring Physician: DUDA, MARCUS Treating Physician/Extender: Frann Rider in Treatment: 0 Clinic Level of Care Assessment Items TOOL 2 Quantity Score X - Use when only an EandM is performed on the INITIAL visit 1 0 ASSESSMENTS - Nursing Assessment / Reassessment X - General Physical Exam (combine w/ comprehensive assessment (listed just 1 20 below) when performed on new pt. evals) X - Comprehensive Assessment (HX, ROS, Risk Assessments, Wounds Hx, etc.) 1 25 ASSESSMENTS - Wound and Skin Assessment / Reassessment []  - Simple Wound Assessment / Reassessment - one wound 0 X - Complex Wound Assessment / Reassessment - multiple wounds 3 5 []  - Dermatologic / Skin Assessment (not related to wound area) 0 ASSESSMENTS - Ostomy and/or Continence Assessment and Care []  - Incontinence Assessment and Management 0 []  - Ostomy Care Assessment and Management (repouching, etc.) 0 PROCESS - Coordination of Care []  - Simple Patient / Family Education for ongoing care 0 X - Complex (extensive) Patient / Family Education for ongoing care 1 20 X - Staff obtains Programmer, systems, Records, Test Results / Process Orders 1 10 X - Staff telephones HHA, Nursing Homes / Clarify orders / etc 1 10 []  - Routine Transfer to another Facility (non-emergent condition) 0 []  - Routine Hospital Admission (non-emergent condition) 0 X - New Admissions / Biomedical engineer / Ordering NPWT, Apligraf, etc. 1 15 []  - Emergency Hospital Admission (emergent condition) 0 X - Simple Discharge Coordination 1 10 [name], Beth ([id number]) []  - Complex (extensive) Discharge Coordination 0 PROCESS - Special Needs []  - Pediatric / Minor Patient Management 0 []  - Isolation Patient Management 0 []  - Hearing / Language / Visual special needs 0 []  - Assessment of Community assistance (transportation, D/C  planning, etc.) 0 []  - Additional assistance / Altered mentation 0 []  - Support Surface(s) Assessment (bed, cushion, seat, etc.) 0 INTERVENTIONS - Wound Cleansing / Measurement X - Wound Imaging (photographs - any number of wounds) 1 5 []  - Wound Tracing (instead of photographs) 0 []  - Simple Wound Measurement - one wound 0 X -  Complex Wound Measurement - multiple wounds 3 5 []  - Simple Wound Cleansing - one wound 0 X - Complex Wound Cleansing - multiple wounds 3 5 INTERVENTIONS - Wound Dressings []  - Small Wound Dressing one or multiple wounds 0 X - Medium Wound Dressing one or multiple wounds 1 15 []  - Large Wound Dressing one or multiple wounds 0 []  - Application of Medications - injection 0 INTERVENTIONS - Miscellaneous []  - External ear exam 0 []  - Specimen Collection (cultures, biopsies, blood, body fluids, etc.) 0 []  - Specimen(s) / Culture(s) sent or taken to Lab for analysis 0 []  - Patient Transfer (multiple staff / Harrel Lemon Lift / Similar devices) 0 []  - Simple Staple / Suture removal (25 or less) 0 []  - Complex Staple / Suture removal (26 or more) 0 [name], Beth ([id number]) []  - Hypo / Hyperglycemic Management (close monitor of Blood Glucose) 0 X - Ankle / Brachial Index (ABI) - do not check if billed separately 1 15 Has the patient been seen at the hospital within the last three years: Yes Total Score: 190 Level Of Care: New/Established - Level 5 Electronic Signature(s) Signed: 07/28/2016 4:46:11 PM By: Alric Quan Entered By: Alric Quan on 07/28/2016 15:53:15 [name] ([id number]) -------------------------------------------------------------------------------- Encounter Discharge Information Details Patient Name: [name] Date of Service: 07/28/2016 9:30 AM Medical Record Number: [id number] Patient Account Number: [id number] Date of Birth/Sex: Oct 05, 1980 (34 y.o. Female) Treating RN: Carolyne Fiscal, Debi Primary Care Physician: Vicenta Aly Other  Clinician: Referring Physician: DUDA, MARCUS Treating Physician/Extender: Frann Rider in Treatment: 0 Encounter Discharge Information Items Discharge Pain Level: 0 Discharge Condition: Stable Ambulatory Status: Wheelchair Discharge Destination: Home Transportation: Private Auto Accompanied By: boyfriend Schedule Follow-up Appointment: Yes Medication Reconciliation completed and provided to Patient/Care No Konnor Jorden: Provided on Clinical Summary of Care: 07/28/2016 Form Type Recipient Paper Patient IG Electronic Signature(s) Signed: 07/28/2016 4:46:11 PM By: Alric Quan Previous Signature: 07/28/2016 11:01:57 AM Version By: Ruthine Dose Entered By: Alric Quan on 07/28/2016 12:46:18 [name] ([id number]) -------------------------------------------------------------------------------- Lower Extremity Assessment Details Patient Name: [name] Date of Service: 07/28/2016 9:30 AM Medical Record Number: [id number] Patient Account Number: [id number] Date of Birth/Sex: 10/04/1980 (34 y.o. Female) Treating RN: Carolyne Fiscal, Debi Primary Care Physician: Vicenta Aly Other Clinician: Referring Physician: DUDA, MARCUS Treating Physician/Extender: Frann Rider in Treatment: 0 Edema Assessment Assessed: [Left: No] [Right: No] E[Left: dema] [Right: :] Calf Left: Right: Point of Measurement: 26 cm From Medial Instep 47.6 cm 48 cm Ankle Left: Right: Point of Measurement: 10 cm From Medial Instep 27 cm 27.4 cm Vascular Assessment Pulses: Posterior Tibial Palpable: [Left:No] [Right:No] Doppler: [Left:Monophasic] [Right:Monophasic] Dorsalis Pedis Palpable: [Left:No] [Right:No] Doppler: [Left:Monophasic] [Right:Monophasic] Extremity colors, hair growth, and conditions: Extremity Color: [Left:Hyperpigmented] [Right:Hyperpigmented] Hair Growth on Extremity: [Left:Yes] [Right:Yes] Temperature of Extremity: [Left:Warm] Capillary Refill: [Left:< 3 seconds]  [Right:< 3 seconds] Toe Nail Assessment Left: Right: Thick: No No Discolored: No No Deformed: No No Improper Length and Hygiene: No No Notes left leg non-compressible right leg non-compressible [name], [name] ([id number]) Electronic Signature(s) Signed: 07/28/2016 4:46:11 PM By: Alric Quan Entered By: Alric Quan on 07/28/2016 10:18:03 [name] ([id number]) -------------------------------------------------------------------------------- Multi Wound Chart Details Patient Name: [name] Date of Service: 07/28/2016 9:30 AM Medical Record Number: [id number] Patient Account Number: [id number] Date of Birth/Sex: 01-26-1981 (34 y.o. 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] [2:the Lower Extremity] [3:the Lower Extremity] Secondary Etiology: [1:N/A] [2:N/A] [3:Open Surgical Wound] Comorbid History: [1:Anemia, Asthma, Chronic Anemia, Asthma, Chronic Anemia, Asthma, Chronic Obstructive Pulmonary Disease (COPD), Congestive Heart Failure, Congestive Heart Failure, Congestive Heart Failure, Hypertension, Myocardial Hypertension,  Myocardial Hypertension, Myocardial Infarction, Type I Diabetes, End Stage Renal Disease, Osteoarthritis, Neuropathy Osteoarthritis, Neuropathy Osteoarthritis, Neuropathy] [2:Obstructive  Pulmonary Disease (COPD), Infarction, Type I Diabetes, End Stage  Renal Disease,] [3:Obstructive Pulmonary Disease (COPD), Infarction, Type I Diabetes, End Stage Renal Disease,] Date Acquired: [1:05/30/2016] [2:05/30/2016] [3:06/25/2016] Weeks of Treatment: [1:0] [2:0] [3:0] Wound Status: [1:Open] [2:Open] [3:Open] Measurements L x W x D 0.5x0.9x0.1 [2:1.6x1.1x0.1] [3:1.3x5x1] (cm) Area (cm) : [1:0.353] [2:1.382] [3:5.105] Volume (cm) : [1:0.035] [2:0.138] [3:5.105] Classification: [1:Grade 1] [2:Grade 1] [3:Grade 3] Exudate Amount: [1:Small] [2:Small] [3:Large] Exudate Type: [1:Serous] [2:Serous] [3:Purulent] Exudate Color: [1:amber] [2:amber] [3:yellow, brown, green] Wound Margin: [1:Distinct, outline attached Distinct, outline attached N/A] Granulation Amount: [1:None Present (0%)] [2:None Present (0%)] [3:None Present (0%)] Necrotic Amount: [1:Large (67-100%)] [2:Large (67-100%)] [3:Large (67-100%)] Necrotic Tissue: [1:Eschar] [2:Eschar] [3:Eschar, Adherent Slough] Exposed Structures: Fascia: No Fascia: No Fascia: Yes Fat: No Fat: No Fat: Yes Tendon: No Tendon: No Tendon: No Muscle: No Muscle: No Muscle: No Joint: No Joint: No Joint: No Bone: No Bone: No Bone: No Limited to Skin Limited to Skin Breakdown Breakdown Epithelialization: None None None Periwound Skin Texture: No Abnormalities Noted No Abnormalities Noted Edema: Yes Periwound Skin No Abnormalities Noted No Abnormalities Noted Maceration: Yes Moisture: Moist: Yes Periwound Skin Color: Erythema: Yes Erythema: Yes Erythema: Yes Erythema Location: Circumferential Circumferential Circumferential Temperature: No Abnormality No Abnormality No Abnormality Tenderness on No Yes Yes Palpation: Wound Preparation: Ulcer Cleansing: Ulcer Cleansing: Ulcer Cleansing: Rinsed/Irrigated with Rinsed/Irrigated with Rinsed/Irrigated with Saline Saline Saline Topical Anesthetic Topical Anesthetic Topical  Anesthetic Applied: Other: lidocaine Applied: Other: lidocaine Applied: Other: lidocaine 4% 4% 4% Treatment Notes Wound #1 (Left, Dorsal Toe Fourth) 1. Cleansed with: Clean wound with Normal Saline 2. Anesthetic Topical Lidocaine 4% cream to wound bed prior to debridement Notes betadine paint Wound #2 (Left, Dorsal Toe Fifth) 1. Cleansed with: Clean wound with Normal Saline 2. Anesthetic Topical Lidocaine 4% cream to wound bed prior to debridement Notes betadine paint Wound #3 (Right Metatarsal head first) 1. Cleansed with: Clean wound with Normal Saline 2. Anesthetic Topical Lidocaine 4% cream to wound bed prior to debridement [name], [name] ([id number]) 4. Dressing Applied: Santyl Ointment 5. Secondary Dressing Applied Guaze, ABD and kerlix/Conform 7. Secured with Tape Notes netting Electronic Signature(s) Signed: 07/28/2016 4:46:11 PM By: Alric Quan Entered By: Alric Quan on 07/28/2016 12:44:31 [name] ([id number]) -------------------------------------------------------------------------------- Multi-Disciplinary Care Plan Details Patient Name: [name] Date of Service: 07/28/2016 9:30 AM Medical Record Number: [id number] Patient Account Number: [id number] Date of Birth/Sex: 06-Mar-1981 (34 y.o. Female) Treating RN: Ahmed Prima Primary Care Physician: Vicenta Aly Other Clinician: Referring Physician: DUDA, MARCUS Treating Physician/Extender: Frann Rider in Treatment: 0 Active Inactive Abuse / Safety / Falls / Self Care Management Nursing Diagnoses: Potential for falls Goals: Patient will remain injury free Date Initiated: 07/28/2016 Goal Status: Active Interventions: Assess fall risk on admission and as needed Assess impairment of mobility on admission and as needed per policy  Notes: Nutrition Nursing Diagnoses: Imbalanced nutrition Impaired glucose control: actual or potential Goals: Patient/caregiver agrees to and  verbalizes understanding of need to use nutritional supplements and/or vitamins as prescribed Date Initiated: 07/28/2016 Goal Status: Active Patient/caregiver verbalizes understanding of need to maintain therapeutic glucose control per primary care physician Date Initiated: 07/28/2016 Goal Status: Active Patient/caregiver will maintain therapeutic glucose control Date Initiated: 07/28/2016 Goal Status: Active Interventions: Assess HgA1c results as ordered upon admission and as needed [name], [name] ([id number]) Assess patient nutrition upon admission and as needed per policy Provide education on elevated blood sugars and impact on wound healing Notes: Orientation to the Wound Care Program Nursing Diagnoses: Knowledge deficit related to the wound healing center program Goals: Patient/caregiver will verbalize understanding of the [place] Program Date Initiated: 07/28/2016 Goal Status: Active Interventions: Provide education on orientation to the wound center Notes: Pain, Acute or Chronic Nursing Diagnoses: Pain, acute or chronic: actual or potential Potential alteration in comfort, pain Goals: Patient will verbalize adequate pain control and receive pain control interventions during procedures as needed Date Initiated: 07/28/2016 Goal Status: Active Patient/caregiver will verbalize adequate pain control between visits Date Initiated: 07/28/2016 Goal Status: Active Patient/caregiver will verbalize comfort level met Date Initiated: 07/28/2016 Goal Status: Active Interventions: Assess comfort goal upon admission Complete pain assessment as per visit requirements Notes: Soft Tissue Infection Nursing Diagnoses: [name], [name] ([id number]) Impaired tissue integrity Knowledge deficit related to disease process and management Knowledge deficit related to home infection control: handwashing, handling of soiled dressings, supply storage Goals: Patient will remain free of  wound infection Date Initiated: 07/28/2016 Goal Status: Active Signs and symptoms of infection will be recognized early to allow for prompt treatment Date Initiated: 07/28/2016 Goal Status: Active Interventions: Assess signs and symptoms of infection every visit Notes: Wound/Skin Impairment Nursing Diagnoses: Impaired tissue integrity Goals: Ulcer/skin breakdown will have a volume reduction of 30% by week 4 Date Initiated: 07/28/2016 Goal Status: Active Ulcer/skin breakdown will have a volume reduction of 50% by week 8 Date Initiated: 07/28/2016 Goal Status: Active Ulcer/skin breakdown will have a volume reduction of 80% by week 12 Date Initiated: 07/28/2016 Goal Status: Active Interventions: Assess patient/caregiver ability to perform ulcer/skin care regimen upon admission and as needed Assess ulceration(s) every visit Notes: Electronic Signature(s) Signed: 07/28/2016 4:46:11 PM By: Alric Quan Entered By: Alric Quan on 07/28/2016 12:44:11 [name] ([id number]) -------------------------------------------------------------------------------- Pain Assessment Details Patient Name: [name] Date of Service: 07/28/2016 9:30 AM Medical Record Number: [id number] Patient Account Number: [id number] Date of Birth/Sex: 12-Mar-1981 (34 y.o. Female) Treating RN: Ahmed Prima Primary Care Physician: Vicenta Aly Other Clinician: Referring Physician: DUDA, MARCUS Treating Physician/Extender: Frann Rider in Treatment: 0 Active Problems Location of Pain Severity and Description of Pain Patient Has Paino Yes Site Locations Pain Location: Pain in Ulcers With Dressing Change: Yes Duration of the Pain. Constant / Intermittento Constant Rate the pain. Current Pain Level: 9 Worst Pain Level: 10 Least Pain Level: 6 Character of Pain Describe the Pain: Shooting, Stabbing, Tender, Throbbing Pain Management and Medication Current Pain Management: Electronic  Signature(s) Signed: 07/28/2016 4:46:11 PM By: Alric Quan Entered By: Alric Quan on 07/28/2016 09:40:56 [name] ([id number]) -------------------------------------------------------------------------------- Patient/Caregiver Education Details Patient Name: [name] Date of Service: 07/28/2016 9:30 AM Medical Record Number: [id number] Patient Account Number: [id number] Date of Birth/Gender: 10/01/1980 (34 y.o. Female) Treating RN: Ahmed Prima Primary Care Physician: Vicenta Aly Other Clinician: Referring Physician: DUDA, MARCUS Treating Physician/Extender: Frann Rider in Treatment: 0 Education Assessment Education Provided To:  Patient Education Topics Provided Elevated Blood Sugar/ Impact on Healing: Handouts: Elevated Blood Sugars: How Do They Affect Wound Healing Methods: Explain/Verbal Responses: State content correctly Welcome To [facility] [place]: Handouts: Welcome To [facility] [place] Methods: Explain/Verbal Responses: State content correctly Wound/Skin Impairment: Handouts: Other: change dressing as ordered Methods: Demonstration, Explain/Verbal Responses: State content correctly Electronic Signature(s) Signed: 07/28/2016 4:46:11 PM By: Alric Quan Entered By: Alric Quan on 07/28/2016 12:46:42 [name] ([id number]) -------------------------------------------------------------------------------- Wound Assessment Details Patient Name: [name] Date of Service: 07/28/2016 9:30 AM Medical Record Number: [id number] Patient Account Number: [id number] Date of Birth/Sex: 05/07/1981 (34 y.o. Female) Treating RN: Carolyne Fiscal, Debi Primary Care Physician: Vicenta Aly Other Clinician: Referring Physician: DUDA, MARCUS Treating Physician/Extender: Frann Rider in Treatment: 0 Wound Status Wound Number: 1 Primary Diabetic Wound/Ulcer of the Lower Etiology: Extremity Wound Location: Left Toe Fourth -  Dorsal Wound Open Wounding Event: Gradually Appeared Status: Date Acquired: 05/30/2016 Comorbid Anemia, Asthma, Chronic Obstructive Weeks Of Treatment: 0 History: Pulmonary Disease (COPD), Congestive Clustered Wound: No Heart Failure, Hypertension, Myocardial Infarction, Type I Diabetes, End Stage Renal Disease, Osteoarthritis, Neuropathy Photos Photo Uploaded By: Alric Quan on 07/28/2016 13:24:05 Wound Measurements Length: (cm) 0.5 % Reduction in Width: (cm) 0.9 % Reduction in Depth: (cm) 0.1 Epithelializat Area: (cm) 0.353 Tunneling: Volume: (cm) 0.035 Undermining: Area: Volume: ion: None No No Wound Description Classification: Grade 1 Wound Margin: Distinct, outline attached Exudate Amount: Small Exudate Type: Serous Exudate Color: amber Foul Odor After Cleansing: No Wound Bed Granulation Amount: None Present (0%) Exposed Structure AMBERLE, LYTER ([id number]) Necrotic Amount: Large (67-100%) Fascia Exposed: No Necrotic Quality: Eschar Fat Layer Exposed: No Tendon Exposed: No Muscle Exposed: No Joint Exposed: No Bone Exposed: No Limited to Skin Breakdown Periwound Skin Texture Texture Color No Abnormalities Noted: No No Abnormalities Noted: No Erythema: Yes Moisture Erythema Location: Circumferential No Abnormalities Noted: No Temperature / Pain Temperature: No Abnormality Wound Preparation Ulcer Cleansing: Rinsed/Irrigated with Saline Topical Anesthetic Applied: Other: lidocaine 4%, Treatment Notes Wound #1 (Left, Dorsal Toe Fourth) 1. Cleansed with: Clean wound with Normal Saline 2. Anesthetic Topical Lidocaine 4% cream to wound bed prior to debridement Notes betadine paint Electronic Signature(s) Signed: 07/28/2016 4:46:11 PM By: Alric Quan Entered By: Alric Quan on 07/28/2016 10:20:02 [name] ([id number]) -------------------------------------------------------------------------------- Wound Assessment Details Patient  Name: [name] Date of Service: 07/28/2016 9:30 AM Medical Record Number: [id number] Patient Account Number: [id number] Date of Birth/Sex: 19-Sep-1981 (34 y.o. Female) Treating RN: Carolyne Fiscal, Debi Primary Care Physician: Vicenta Aly Other Clinician: Referring Physician: DUDA, MARCUS Treating Physician/Extender: Frann Rider in Treatment: 0 Wound Status Wound Number: 2 Primary Diabetic Wound/Ulcer of the Lower Etiology: Extremity Wound Location: Left Toe Fifth - Dorsal Wound Open Wounding Event: Gradually Appeared Status: Date Acquired: 05/30/2016 Comorbid Anemia, Asthma, Chronic Obstructive Weeks Of Treatment: 0 History: Pulmonary Disease (COPD), Congestive Clustered Wound: No Heart Failure, Hypertension, Myocardial Infarction, Type I Diabetes, End Stage Renal Disease, Osteoarthritis, Neuropathy Photos Photo Uploaded By: Alric Quan on 07/28/2016 13:24:06 Wound Measurements Length: (cm) 1.6 % Reduction in Width: (cm) 1.1 % Reduction in Depth: (cm) 0.1 Epithelializat Area: (cm) 1.382 Tunneling: Volume: (cm) 0.138 Undermining: Area: Volume: ion: None No No Wound Description Classification: Grade 1 Wound Margin: Distinct, outline attached Exudate Amount: Small Exudate Type: Serous Exudate Color: amber Foul Odor After Cleansing: No Wound Bed Granulation Amount: None Present (0%) Exposed Structure Biehl, Kanija ([id number]) Necrotic Amount: Large (67-100%) Fascia Exposed: No Necrotic Quality: Eschar Fat Layer Exposed: No Tendon Exposed: No  Muscle Exposed: No Joint Exposed: No Bone Exposed: No Limited to Skin Breakdown Periwound Skin Texture Texture Color No Abnormalities Noted: No No Abnormalities Noted: No Erythema: Yes Moisture Erythema Location: Circumferential No Abnormalities Noted: No Temperature / Pain Temperature: No Abnormality Tenderness on Palpation: Yes Wound Preparation Ulcer Cleansing: Rinsed/Irrigated with  Saline Topical Anesthetic Applied: Other: lidocaine 4%, Treatment Notes Wound #2 (Left, Dorsal Toe Fifth) 1. Cleansed with: Clean wound with Normal Saline 2. Anesthetic Topical Lidocaine 4% cream to wound bed prior to debridement Notes betadine paint Electronic Signature(s) Signed: 07/28/2016 4:46:11 PM By: Alric Quan Entered By: Alric Quan on 07/28/2016 10:21:21 [name] ([id number]) -------------------------------------------------------------------------------- Wound Assessment Details Patient Name: [name] Date of Service: 07/28/2016 9:30 AM Medical Record Number: [id number] Patient Account Number: [id number] Date of Birth/Sex: 1981-09-03 (34 y.o. Female) Treating RN: Carolyne Fiscal, Debi Primary Care Physician: Vicenta Aly Other Clinician: Referring Physician: DUDA, MARCUS Treating Physician/Extender: Frann Rider in Treatment: 0 Wound Status Wound Number: 3 Primary Diabetic Wound/Ulcer of the Lower Etiology: Extremity Wound Location: Right Metatarsal head first Secondary Open Surgical Wound Wounding Event: Surgical Injury Etiology: Date Acquired: 06/25/2016 Wound Open Weeks Of Treatment: 0 Status: Clustered Wound: No Comorbid Anemia, Asthma, Chronic Obstructive History: Pulmonary Disease (COPD), Congestive Heart Failure, Hypertension, Myocardial Infarction, Type I Diabetes, End Stage Renal Disease, Osteoarthritis, Neuropathy Photos Photo Uploaded By: Alric Quan on 07/28/2016 13:24:27 Wound Measurements Length: (cm) 1.3 % Reduction in Width: (cm) 5 % Reduction in Depth: (cm) 1 Epithelializati Area: (cm) 5.105 Tunneling: Volume: (cm) 5.105 Undermining: Area: Volume: on: None No No Wound Description Classification: Grade 3 Exudate Amount: Large Exudate Type: Purulent Exudate Color: yellow, brown, green Foul Odor After Cleansing: No Wound Bed TKAI, LARGE ([id number]) Granulation Amount: None Present (0%) Exposed  Structure Necrotic Amount: Large (67-100%) Fascia Exposed: Yes Necrotic Quality: Eschar, Adherent Slough Fat Layer Exposed: Yes Tendon Exposed: No Muscle Exposed: No Joint Exposed: No Bone Exposed: No Periwound Skin Texture Texture Color No Abnormalities Noted: No No Abnormalities Noted: No Localized Edema: Yes Erythema: Yes Erythema Location: Circumferential Moisture No Abnormalities Noted: No Temperature / Pain Maceration: Yes Temperature: No Abnormality Moist: Yes Tenderness on Palpation: Yes Wound Preparation Ulcer Cleansing: Rinsed/Irrigated with Saline Topical Anesthetic Applied: Other: lidocaine 4%, Electronic Signature(s) Signed: 07/28/2016 4:46:11 PM By: Alric Quan Entered By: Alric Quan on 07/28/2016 10:26:41 [name] ([id number]) -------------------------------------------------------------------------------- Vitals Details Patient Name: [name] Date of Service: 07/28/2016 9:30 AM Medical Record Number: [id number] Patient Account Number: [id number] Date of Birth/Sex: 1981/08/24 (34 y.o. Female) Treating RN: Carolyne Fiscal, Debi Primary Care Physician: Vicenta Aly Other Clinician: Referring Physician: DUDA, MARCUS Treating Physician/Extender: Frann Rider in Treatment: 0 Vital Signs Time Taken: 09:40 Temperature (F): 97.6 Height (in): 67 Pulse (bpm): 83 Source: Stated Respiratory Rate (breaths/min): 20 Weight (lbs): 262 Blood Pressure (mmHg): 121/77 Source: Stated Reference Range: 80 - 120 mg / dl Body Mass Index (BMI): 41 Electronic Signature(s) Signed: 07/28/2016 4:46:11 PM By: Alric Quan Entered By: Alric Quan on 07/28/2016 09:43:22

## 2016-07-30 ENCOUNTER — Telehealth (INDEPENDENT_AMBULATORY_CARE_PROVIDER_SITE_OTHER): Payer: Self-pay | Admitting: Radiology

## 2016-07-30 NOTE — Telephone Encounter (Signed)
Received call from Marisue Ivan, RN with [facility]. She states patient's wound has gotten deeper and it looks like it is getting worse. She felt patient should be seen prior to next week. I called patient to work in and she has dialysis on Thursday and could not get a ride today. She is on schedule for Friday morning at 9am.

## 2016-08-01 ENCOUNTER — Encounter (INDEPENDENT_AMBULATORY_CARE_PROVIDER_SITE_OTHER): Payer: Self-pay | Admitting: Orthopedic Surgery

## 2016-08-01 ENCOUNTER — Ambulatory Visit (INDEPENDENT_AMBULATORY_CARE_PROVIDER_SITE_OTHER): Payer: Medicaid Other | Admitting: Orthopedic Surgery

## 2016-08-01 DIAGNOSIS — IMO0002 Reserved for concepts with insufficient information to code with codable children: Secondary | ICD-10-CM

## 2016-08-01 DIAGNOSIS — Z89411 Acquired absence of right great toe: Secondary | ICD-10-CM

## 2016-08-01 MED ORDER — OXYCODONE HCL 5 MG PO TABS: 5.0000 mg | ORAL_TABLET | ORAL | 0 refills | Status: DC | PRN

## 2016-08-01 NOTE — Progress Notes (Signed)
Wound Care Note   Patient: [name]           Date of Birth: [birth year]           MRN: [id number]             PCP: Elizabeth PalauANDERSON,TERESA, FNP Visit Date: 08/01/2016   Assessment & Plan: Visit Diagnoses:  1. Great toe amputation status, right [facility](HCC)     Plan: Follow-up in the office in 3 weeks. Patient does complain of increased pain she does have some dermatitis with ischemic changes to the wound bed. The patient is currently on wound care with Santyl dressing changes 3 times a week she is on doxycycline she has been using [facility] but there is not one on at this time. Doppler was used to has a strong dorsalis pedis pipe phasic pulse. No signs of major arterial insufficiency.   Follow-Up Instructions: Return in about 3 weeks (around 08/22/2016).  Orders:  No orders of the defined types were placed in this encounter.  Meds ordered this encounter  Medications  . oxyCODONE (OXY IR/ROXICODONE) 5 MG immediate release tablet    Sig: Take 1 tablet (5 mg total) by mouth every 4 (four) hours as needed for severe pain.    Dispense:  60 tablet    Refill:  0      Procedures: No notes on file   Clinical Data: No additional findings.   No images are attached to the encounter.   Subjective: Chief Complaint  Patient presents with  . Right Foot - Wound Check    Patient right great toe amputation 06/25/2016. She was seen in office last week and advised to pack wound with santyl dressing changes daily. She has increased redness and pain. There is wound dehiscence, there is no odor, she denies fever or nausea. She has been weightbearing on right foot and states she is trying her best to stay off it. She ambulates with wheelchair today. [facility] who advised her she should have her home health nursing increased to three times a week. She is currently taking doxycycline 100mg  1 po bid.    Review of Systems  Miscellaneous:  -Home Health Care:  once weekly   -Physical Therapy: no  -Out of Work?: yes     Objective: Vital Signs: There were no vitals taken for this visit.  Physical Exam: Examination patient has dermatitis around the wound but there is no purulence no drainage no signs of abscess. The wound has dehisced and its currently about 4 x 2 cm and 1 cm deep there is ischemic changes at the base of the wound. She has some dermatitis in the second toe as well. She is a continue with wound care with Santyl dressing changes she is to use a nitroglycerin patch daily she is to continue with her doxycycline.  Specialty Comments: No specialty comments available.   PMFS History: Patient Active Problem List   Diagnosis Date Noted  . Great toe amputation status, right (HCC) 08/01/2016  . Diabetic osteomyelitis (HCC)   . Gangrene (HCC)   . 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 Diabetic foot infection (HCC) 06/20/2016  . ESRD (end stage renal disease) on dialysis (HCC) 06/20/2016  . Diabetic foot ulcer (HCC) 06/20/2016  . Pre-operative clearance 10/12/2015  . Normal coronary arteries 10/12/2015  . NSTEMI- type 2, Troponin 11.2 05/11/2015  . Respiratory failure requiring intubation (HCC) 05/10/2015  . History of  arthroscopy of right shoulder-05/10/15 05/10/2015  . Cellulitis of right lower extremity 09/21/2014  . CKD (chronic kidney disease) stage 5, GFR less than 15 ml/min (HCC) 09/21/2014  . Cellulitis of foot, right 09/21/2014  . Unspecified asthma(493.90) 10/25/2013  . Acute combined systolic and diastolic heart failure (HCC) 09/21/2013  . Non-ischemic cardiomyopathy - by echo 8/16- EF 35-40% 09/16/2013  . Morbid obesity-  09/16/2013  . 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) 09/15/2013  . Normocytic anemia 09/15/2013  . Lower extremity edema 09/15/2013  . Hyperlipidemia   . Hypertension    Past Medical History:  Diagnosis Date  . Anemia   . Arthritis    "left hand" (09/15/2013)  .  Asthma   . Chronic bronchitis (HCC)    "just about q yr" (09/15/2013)  . Chronic kidney disease    "low kidney function" (09/15/2013)  . COPD (chronic obstructive pulmonary disease) (HCC)   . Coronary artery disease   . Hyperlipidemia   . Hypertension   . Migraine    "get them alot" (09/15/2013)  . Myocardial infarction 04/2015   NSTEMI  . Normal coronary arteries    by cardiac catheterization 09/20/13  . Pneumonia    "couple times; have it now" (09/15/2013)  . Shortness of breath    "just recently; related to the pneumonia" (09/15/2013)  . Type 1 diabetes mellitus (HCC)    type 2    Family History  Problem Relation Age of Onset  . Diabetes Mother   . Stroke Mother   . Hypertension Father   . Hyperlipidemia Father   . Cancer - Lung Father   . Diabetes Maternal Grandmother   . Cancer Paternal Grandmother    Past Surgical History:  Procedure Laterality Date  . AMPUTATION Right 06/25/2016   Procedure: Amputation Right Great Toe at the Metatarsophalangeal Joint;  Surgeon: Nadara Mustard, MD;  Location: [facility] OR;  Service: Orthopedics;  Laterality: Right;  . AV FISTULA PLACEMENT Left 03/27/2015   Procedure: CREATION RADIAL CEPHALIC ARTERIOVENOUS FISTULA;  Surgeon: Chuck Hint, MD;  Location: [facility] OR;  Service: Vascular;  Laterality: Left;  . AV FISTULA PLACEMENT Left 11/23/2015   Procedure:  LEFT ARM BASILIC VEIN TRANSPOSITION  ;  Surgeon: Chuck Hint, MD;  Location: [facility] OR;  Service: Vascular;  Laterality: Left;  . CESAREAN SECTION  1999; 2006  . CORONARY ANGIOGRAM  09/20/2013   Procedure: CORONARY ANGIOGRAM;  Surgeon: Runell Gess, MD;  Location: [facility] CATH LAB;  Service: Cardiovascular;;  . FINGER SURGERY Left 1985   3rd and 4th digits reconstructed after cut off" (09/15/2013)  . SHOULDER ARTHROSCOPY WITH BICEPSTENOTOMY Right 05/10/2015   Procedure: RIGHT SHOULDER ARTHROSCOPY WITH BICEPS TENOTOMY, DEBRIDEMENT LABRAL TEAR;  Surgeon: Jones Broom, MD;  Location:  [facility] OR;  Service: Orthopedics;  Laterality: Right;  Right shoulder arthroscopy biceps tenotomy, debridement labral tear  . TONSILLECTOMY  1997  . TUBAL LIGATION  2006   Social History   Occupational History  . Student    Social History Main Topics  . Smoking status: Never Smoker  . Smokeless tobacco: Never Used  . Alcohol use No  . Drug use: No  . Sexual activity: Not Currently    Partners: Male    Birth control/ protection: Other-see comments     Comment: S/P tubal ligation

## 2016-08-04 ENCOUNTER — Ambulatory Visit (INDEPENDENT_AMBULATORY_CARE_PROVIDER_SITE_OTHER): Payer: Medicaid Other | Admitting: Orthopedic Surgery

## 2016-08-08 ENCOUNTER — Telehealth (INDEPENDENT_AMBULATORY_CARE_PROVIDER_SITE_OTHER): Payer: Self-pay | Admitting: *Deleted

## 2016-08-08 ENCOUNTER — Encounter: Payer: Medicaid Other | Attending: Surgery | Admitting: Surgery

## 2016-08-08 DIAGNOSIS — T8753 Necrosis of amputation stump, right lower extremity: Secondary | ICD-10-CM | POA: Insufficient documentation

## 2016-08-08 DIAGNOSIS — E10621 Type 1 diabetes mellitus with foot ulcer: Secondary | ICD-10-CM | POA: Insufficient documentation

## 2016-08-08 DIAGNOSIS — I509 Heart failure, unspecified: Secondary | ICD-10-CM | POA: Insufficient documentation

## 2016-08-08 DIAGNOSIS — I132 Hypertensive heart and chronic kidney disease with heart failure and with stage 5 chronic kidney disease, or end stage renal disease: Secondary | ICD-10-CM | POA: Diagnosis not present

## 2016-08-08 DIAGNOSIS — I252 Old myocardial infarction: Secondary | ICD-10-CM | POA: Insufficient documentation

## 2016-08-08 DIAGNOSIS — L97513 Non-pressure chronic ulcer of other part of right foot with necrosis of muscle: Secondary | ICD-10-CM | POA: Diagnosis not present

## 2016-08-08 DIAGNOSIS — N186 End stage renal disease: Secondary | ICD-10-CM | POA: Diagnosis not present

## 2016-08-08 DIAGNOSIS — I70235 Atherosclerosis of native arteries of right leg with ulceration of other part of foot: Secondary | ICD-10-CM | POA: Diagnosis not present

## 2016-08-08 DIAGNOSIS — L97521 Non-pressure chronic ulcer of other part of left foot limited to breakdown of skin: Secondary | ICD-10-CM | POA: Diagnosis not present

## 2016-08-08 DIAGNOSIS — Z992 Dependence on renal dialysis: Secondary | ICD-10-CM | POA: Diagnosis not present

## 2016-08-08 DIAGNOSIS — Y839 Surgical procedure, unspecified as the cause of abnormal reaction of the patient, or of later complication, without mention of misadventure at the time of the procedure: Secondary | ICD-10-CM | POA: Insufficient documentation

## 2016-08-08 DIAGNOSIS — D649 Anemia, unspecified: Secondary | ICD-10-CM | POA: Insufficient documentation

## 2016-08-08 DIAGNOSIS — Z794 Long term (current) use of insulin: Secondary | ICD-10-CM | POA: Diagnosis not present

## 2016-08-08 DIAGNOSIS — E1022 Type 1 diabetes mellitus with diabetic chronic kidney disease: Secondary | ICD-10-CM | POA: Diagnosis not present

## 2016-08-08 DIAGNOSIS — Z6841 Body Mass Index (BMI) 40.0 and over, adult: Secondary | ICD-10-CM | POA: Insufficient documentation

## 2016-08-08 DIAGNOSIS — E1051 Type 1 diabetes mellitus with diabetic peripheral angiopathy without gangrene: Secondary | ICD-10-CM | POA: Insufficient documentation

## 2016-08-08 DIAGNOSIS — J449 Chronic obstructive pulmonary disease, unspecified: Secondary | ICD-10-CM | POA: Diagnosis not present

## 2016-08-08 NOTE — Telephone Encounter (Signed)
ERROR

## 2016-08-09 NOTE — Progress Notes (Signed)
[name], [name] ([id number]) Visit Report for 08/08/2016 Chief Complaint Document Details Patient Name: [name], [name] 08/08/2016 12:45 Date of Service: PM Medical Record [id number] Number: Patient Account Number: [id number] 1980/10/09 (35 y.o. Treating RN: Beth CoventryWoody, Beth Date of Birth/Sex: Female) Other Clinician: Primary Care Treating Beth LinerANDERSON, Beth Shay Jhaveri Physician: Physician/Extender: Referring Physician: Evangeline DakinANDERSON, Beth Weeks in Treatment: 1 Information Obtained from: Patient Chief Complaint Patients presents for treatment of an open diabetic ulcer to the medial part of her right forefoot where an amputation was done recently and ulcerated area on the left third and fourth toe for about 6 weeks Electronic Signature(s) Signed: 08/08/2016 1:22:51 PM By: Beth KannerBritto, Dejean Tribby MD, FACS Entered By: Beth KannerBritto, [name] on 08/08/2016 13:22:51 [name], [name] ([id number]) -------------------------------------------------------------------------------- HPI Details Patient Name: [name], [name] 08/08/2016 12:45 Date of Service: PM Medical Record [id number] Number: Patient Account Number: [id number] 1980/10/09 (34 y.o. Treating RN: Beth CoventryWoody, Beth Date of Birth/Sex: Female) Other Clinician: Primary Care Treating Beth LinerANDERSON, Beth Drexler Maland Physician: Physician/Extender: Referring Physician: Evangeline DakinANDERSON, Beth Weeks in Treatment: 1 History of Present Illness Location: open wound right medial foot where recent amputation was done and skin ulcers on the left third and fourth toe Quality: Patient reports experiencing a sharp pain to affected area(s). Severity: Patient states wound are getting worse. Duration: Patient has had the wound for < 2 weeks prior to presenting for treatment Timing: Pain in wound is constant (hurts all the time) Context: The wound occurred when the patient had gangrene of the right first toe and was admitted to the hospital Modifying Factors: Other treatment(s) tried  include:doxycycline and Santyl ointment Associated Signs and Symptoms: Patient reports having foul odor. 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 She also is on hemodialysis Recently seen by [name] on 07/22/2016 a right great toe open wound and he had treated her for this with nitroglycerin patch changes daily and dressing for the foot with nonweightbearing and has asked her to return in 2 weeks. he had also put her on central dressings and doxycycline was prescribed for 2 weeks past surgical history is significant for amputation of the right great toe at the MTP joint on 06/25/2016 by Dr. Lajoyce [name]. Also status AV fistula placement, cesarean section, coronary angiogram, left third and fourth digit reconstruction surgery of her fingers, shoulder arthroscopy and tonsillectomy. She has never been a smoker. her MRI done on 06/22/2016 showed -- IMPRESSION: Ulceration on the distal great toe. Mild marrow edema in the tuft of the distal phalanx is worrisome for osteomyelitis. Negative for abscess or septic joint. as per Dr. Audrie Liauda's notes she had an amputation of the right great toe and partial amputation of the first metatarsal including the sesamoids as she had a diagnosis of gangrene, osteomyelitis and had failed conservative measures and elected for surgical management. Arterial duplex studies done on 06/21/2016 showed bilateral noncompressible arteries with the right pedal artery waveform appears abnormal with monophasic flow and the left lower extremity flow was triphasic. hemoglobin A1c on September 22 was 5.1% O St. recently was 4.6% a week ago. 08/08/2016 -- the patient has seen Dr. Lajoyce [name] who  did not recommend any further surgical intervention and asked her to continue with local care and see him back in 2 weeks. She has not seen the infectious disease doctor. She has not  got her x-rays done. The patient continues to be his most nonchalant about her care and she does not seem to be very compliant. [name] ([id number]) Electronic Signature(s) Signed: 08/08/2016 1:23:47 PM By: Beth Kanner MD, FACS Entered By: [name] on 08/08/2016 13:23:47 [name] ([id number]) -------------------------------------------------------------------------------- Physical Exam Details Patient Name: [name], [name] 08/08/2016 12:45 Date of Service: PM Medical Record [id number] Number: Patient Account Number: [id number] [birth year] (34 y.o. Treating RN: [name] Date of Birth/Sex: Female) Other Clinician: Primary Care Treating [name] Physician: Physician/Extender: Referring Physician: Elizabeth [name] Weeks in Treatment: 1 Constitutional . Pulse regular. Respirations normal and unlabored. Afebrile. . Eyes Nonicteric. Reactive to light. Ears, Nose, Mouth, and Throat Lips, teeth, and gums WNL.Marland Kitchen Moist mucosa without lesions. Neck supple and nontender. No palpable supraclavicular or cervical adenopathy. Normal sized without goiter. Respiratory WNL. No retractions.. Breath sounds WNL, No rubs, rales, rhonchi, or wheeze.. Cardiovascular Heart rhythm and rate regular, no murmur or gallop.. Pedal Pulses WNL. No clubbing, cyanosis or edema. Chest Breasts symmetical and no nipple discharge.. Breast tissue WNL, no masses, lumps, or tenderness.. Lymphatic No adneopathy. No adenopathy. No adenopathy. Musculoskeletal Adexa without tenderness or enlargement.. Digits and nails w/o clubbing, cyanosis, infection, petechiae, ischemia, or inflammatory conditions.. Integumentary (Hair, Skin) No suspicious lesions. No crepitus or fluctuance. No peri-wound warmth or erythema. No  masses.Marland Kitchen Psychiatric Judgement and insight Intact.. No evidence of depression, anxiety, or agitation.. Notes the left third and fourth toes show superficial abrasions which did not show any signs of progressive cellulitis or necrosis. The wound on the right medial foot continues to have excessive amount of necrotic debris and minimal surrounding cellulitis and is extremely tender so debridement in the office is not possible. Electronic Signature(s) Signed: 08/08/2016 1:24:32 PM By: Beth Kanner MD, FACS Entered By: [name] on 08/08/2016 13:24:32 [name], [name] ([id number]) [name], [name] ([id number]) -------------------------------------------------------------------------------- Physician Orders Details Patient Name: [name], [name] 08/08/2016 12:45 Date of Service: PM Medical Record [id number] Number: Patient Account Number: [id number] 01-Apr-1981 (34 y.o. Treating RN: [name] Date of Birth/Sex: Female) Other Clinician: Primary Care Treating [name] Physician: Physician/Extender: Referring Physician: Evangeline Dakin in Treatment: 1 Verbal / Phone Orders: Yes Clinician: Huel [name] Read Back and Verified: Yes Diagnosis Coding Wound Cleansing Wound #1 Left,Dorsal Toe Fourth o Clean wound with Normal Saline. Wound #2 Left,Dorsal Toe Fifth o Clean wound with Normal Saline. Wound #3 Right Metatarsal head first o Clean wound with Normal Saline. Anesthetic Wound #1 Left,Dorsal Toe Fourth o Topical Lidocaine 4% cream applied to wound bed prior to debridement - for clinic use only Wound #2 Left,Dorsal Toe Fifth o Topical Lidocaine 4% cream applied to wound bed prior to debridement - for clinic use only Wound #3 Right Metatarsal head first o Topical Lidocaine 4% cream applied to wound bed prior to debridement - for clinic use only Primary Wound Dressing Wound #1 Left,Dorsal Toe Fourth o Other: - paint with betadine Wound #2 Left,Dorsal  Toe Fifth o Other: - paint with betadine Wound #3 Right Metatarsal head first o Santyl Ointment Secondary Dressing Wound #3 Right Metatarsal head first o Gauze, ABD and Kerlix/Conform - tape, stretch netting #4 Coldren, Anntionette ([id number]) Dressing Change Frequency Wound #1 Left,Dorsal Toe Fourth o Change dressing every day. Wound #2 Left,Dorsal Toe Fifth o Change dressing every day. Wound #3 Right Metatarsal head first o Change dressing every day. Follow-up Appointments Wound #1 Left,Dorsal Toe Fourth o Return Appointment in 1 week. Wound #2 Left,Dorsal Toe Fifth o  Return Appointment in 1 week. Wound #3 Right Metatarsal head first o Return Appointment in 1 week. Edema Control Wound #1 Left,Dorsal Toe Fourth o Elevate legs to the level of the heart and pump ankles as often as possible Wound #2 Left,Dorsal Toe Fifth o Elevate legs to the level of the heart and pump ankles as often as possible Wound #3 Right Metatarsal head first o Elevate legs to the level of the heart and pump ankles as often as possible Off-Loading Wound #3 Right Metatarsal head first o Other: - Keep foot elevated as much as possible. Additional Orders / Instructions Wound #1 Left,Dorsal Toe Fourth o Increase protein intake. Wound #2 Left,Dorsal Toe Fifth o Increase protein intake. Wound #3 Right Metatarsal head first o Increase protein intake. Home Health Wound #1 Left,Dorsal Toe Fourth o Continue Home Health Visits - [facility] BEATA, BEASON ([id number]) o Home Health Nurse may visit PRN to address patientos wound care needs. o FACE TO FACE ENCOUNTER: MEDICARE and MEDICAID PATIENTS: I certify that this patient is under my care and that I had a face-to-face encounter that meets the physician face-to-face encounter requirements with this patient on this date. The encounter with the patient was in whole or in part for the following MEDICAL CONDITION: (primary reason  for Home Healthcare) MEDICAL NECESSITY: I certify, that based on my findings, NURSING services are a medically necessary home health service. HOME BOUND STATUS: I certify that my clinical findings support that this patient is homebound (i.e., Due to illness or injury, pt requires aid of supportive devices such as crutches, cane, wheelchairs, walkers, the use of special transportation or the assistance of another person to leave their place of residence. There is a normal inability to leave the home and doing so requires considerable and taxing effort. Other absences are for medical reasons / religious services and are infrequent or of short duration when for other reasons). o If current dressing causes regression in wound condition, may D/C ordered dressing product/s and apply Normal Saline Moist Dressing daily until next Wound Healing Center / Other MD appointment. Notify Wound Healing Center of regression in wound condition at [phone]. o Please direct any NON-WOUND related issues/requests for orders to patient's Primary Care Physician Wound #2 Left,Dorsal Toe Fifth o Continue Home Health Visits - [facility] o Home Health Nurse may visit PRN to address patientos wound care needs. o FACE TO FACE ENCOUNTER: MEDICARE and MEDICAID PATIENTS: I certify that this patient is under my care and that I had a face-to-face encounter that meets the physician face-to-face encounter requirements with this patient on this date. The encounter with the patient was in whole or in part for the following MEDICAL CONDITION: (primary reason for Home Healthcare) MEDICAL NECESSITY: I certify, that based on my findings, NURSING services are a medically necessary home health service. HOME BOUND STATUS: I certify that my clinical findings support that this patient is homebound (i.e., Due to illness or injury, pt requires aid of supportive devices such as crutches, cane, wheelchairs, walkers, the  use of special transportation or the assistance of another person to leave their place of residence. There is a normal inability to leave the home and doing so requires considerable and taxing effort. Other absences are for medical reasons / religious services and are infrequent or of short duration when for other reasons). o If current dressing causes regression in wound condition, may D/C ordered dressing product/s and apply Normal Saline Moist Dressing daily until next Wound  Healing Center / Other MD appointment. Notify Wound Healing Center of regression in wound condition at [phone]. o Please direct any NON-WOUND related issues/requests for orders to patient's Primary Care Physician Wound #3 Right Metatarsal head first o Continue Home Health Visits - [facility] o Home Health Nurse may visit PRN to address patientos wound care needs. o FACE TO FACE ENCOUNTER: MEDICARE and MEDICAID PATIENTS: I certify that this patient is under my care and that I had a face-to-face encounter that meets the physician face-to-face encounter requirements with this patient on this date. The encounter with the patient was in whole or in part for the following MEDICAL CONDITION: (primary reason for Home Healthcare) MEDICAL NECESSITY: I certify, that based on my findings, NURSING services are a medically necessary home health service. HOME BOUND STATUS: I certify that my clinical findings support that this patient is homebound (i.e., Due to illness or injury, pt requires aid of MANAMI, TUTOR ([id number]) supportive devices such as crutches, cane, wheelchairs, walkers, the use of special transportation or the assistance of another person to leave their place of residence. There is a normal inability to leave the home and doing so requires considerable and taxing effort. Other absences are for medical reasons / religious services and are infrequent or of short duration when for other  reasons). o If current dressing causes regression in wound condition, may D/C ordered dressing product/s and apply Normal Saline Moist Dressing daily until next Wound Healing Center / Other MD appointment. Notify Wound Healing Center of regression in wound condition at [phone]. o Please direct any NON-WOUND related issues/requests for orders to patient's Primary Care Physician Medications-please add to medication list. Wound #1 Left,Dorsal Toe Fourth o Santyl Enzymatic Ointment o Other: - Vitamin A, Vitamin C, Zinc Wound #2 Left,Dorsal Toe Fifth o Santyl Enzymatic Ointment o Other: - Vitamin A, Vitamin C, Zinc Wound #3 Right Metatarsal head first o Santyl Enzymatic Ointment o Other: - Vitamin A, Vitamin C, Zinc Electronic Signature(s) Signed: 08/08/2016 4:13:44 PM By: Beth Kanner MD, FACS Signed: 08/08/2016 4:48:58 PM By: Elliot Gurney, RN, BSN, Kim RN, BSN Entered By: Elliot Gurney, RN, BSN, Beth on 08/08/2016 13:11:50 [name] ([id number]) -------------------------------------------------------------------------------- Problem List Details Patient Name: [name], [name] 08/08/2016 12:45 Date of Service: PM Medical Record [id number] Number: Patient Account Number: [id number] 1981/02/11 (34 y.o. 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 L97.521 Non-pressure chronic ulcer of other part of left foot limited 07/28/2016 Yes to breakdown of skin Z99.2 Dependence on  renal dialysis 07/28/2016 Yes T87.53 Necrosis of amputation stump, right lower extremity 07/28/2016 Yes Inactive Problems Resolved Problems Electronic Signature(s) Signed: 08/08/2016 1:22:43 PM By: Beth Kanner MD, FACS Entered By: [name] on 08/08/2016 13:22:43 [name] ([id number]) -------------------------------------------------------------------------------- Progress Note Details Patient Name: [name], [name] 08/08/2016 12:45 Date of Service: PM Medical Record [id number] Number: Patient Account Number: [id number] 07-17-1981 (34 y.o. Treating RN: [name] Date of Birth/Sex: Female) Other Clinician: Primary Care Treating [name] Physician: Physician/Extender: Referring Physician: Evangeline Dakin in Treatment: 1 Subjective Chief Complaint Information obtained from Patient Patients presents for treatment  of an open diabetic ulcer to the medial part of her right forefoot where an amputation was done recently and ulcerated area on the left third and fourth toe for about 6 weeks History of Present Illness (HPI) The following HPI elements were documented for the patient's wound: Location: open wound right medial foot where recent amputation was done and skin ulcers on the left third and fourth toe Quality: Patient reports experiencing a sharp pain to affected area(s). Severity: Patient states wound are getting worse. Duration: Patient has had the wound for < 2 weeks prior to presenting for treatment Timing: Pain in wound is constant (hurts all the time) Context: The wound occurred when the patient had gangrene of the right first toe and was admitted to the hospital Modifying Factors: Other treatment(s) tried include:doxycycline and Santyl ointment Associated Signs and Symptoms: Patient reports having foul odor. 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 She also is on hemodialysis Recently seen by Dr. Aldean Baker on 07/22/2016 a right great toe open wound and he had treated her for this with nitroglycerin patch changes daily and dressing for the foot with nonweightbearing and has asked her to return in 2 weeks. he had also put her on central dressings and doxycycline was prescribed for 2 weeks past surgical history is significant for amputation of the right great toe at the MTP joint on 06/25/2016 by Dr. [name]. Also status AV fistula placement, cesarean section, coronary angiogram, left third and fourth digit reconstruction surgery of her fingers, shoulder arthroscopy and tonsillectomy. She has never been a smoker. her MRI done on 06/22/2016 showed -- IMPRESSION: Ulceration on the distal great toe. Mild marrow edema in the tuft of the distal phalanx is worrisome for osteomyelitis. Negative for abscess or septic joint. as per Dr. Audrie Lia notes she had an amputation of the right great toe and partial amputation of the first metatarsal including the sesamoids as she had a diagnosis of gangrene, osteomyelitis and had failed [name], [name] ([id number]) conservative measures and elected for surgical management. Arterial duplex studies done on 06/21/2016 showed bilateral noncompressible arteries with the right pedal artery waveform appears abnormal with monophasic flow and the left lower extremity flow was triphasic. hemoglobin A1c on September 22 was 5.1% O St. recently was 4.6% a week ago. 08/08/2016 -- the patient has seen Dr. [name] who did not recommend any further surgical intervention and asked her to continue with local care and see him back in 2 weeks. She has not seen the infectious disease doctor. She has not got her x-rays done. The patient  continues to be his most nonchalant about her care and she does not seem to be very compliant. Objective Constitutional Pulse regular. Respirations normal and unlabored. Afebrile. Vitals Time Taken: 12:45 PM, Height: 67 in, Weight: 262 lbs, BMI: 41, Temperature: 97.6 F, Pulse: 98 bpm, Respiratory Rate: 18 breaths/min, Blood Pressure: 136/79 mmHg. Eyes Nonicteric. Reactive to light. Ears, Nose, Mouth, and Throat Lips, teeth, and gums WNL.Marland Kitchen Moist mucosa without lesions. Neck supple and nontender. No palpable supraclavicular or cervical adenopathy. Normal sized without goiter. Respiratory WNL. No retractions.. Breath sounds WNL, No rubs, rales, rhonchi, or wheeze.. Cardiovascular Heart rhythm and rate regular, no murmur or gallop.. Pedal Pulses WNL. No clubbing, cyanosis or edema. Chest Breasts symmetical and no  nipple discharge.. Breast tissue WNL, no masses, lumps, or tenderness.. Lymphatic No adneopathy. No adenopathy. No adenopathy. Musculoskeletal Adexa without tenderness or enlargement.. Digits and nails w/o clubbing, cyanosis, infection, petechiae, ischemia, or inflammatory conditions.Marland Kitchen Psychiatric STEPHINE, BRIZZOLARA ([id number]) Judgement and insight Intact.. No evidence of depression, anxiety, or agitation.. General Notes: the left third and fourth toes show superficial abrasions which did not show any signs of progressive cellulitis or necrosis. The wound on the right medial foot continues to have excessive amount of necrotic debris and minimal surrounding cellulitis and is extremely tender so debridement in the office is not possible. Integumentary (Hair, Skin) No suspicious lesions. No crepitus or fluctuance. No peri-wound warmth or erythema. No masses.. Wound #1 status is Open. Original cause of wound was Gradually Appeared. The wound is located on the Left,Dorsal Toe Fourth. The wound measures 1.5cm length x 1.1cm width x 0.1cm depth; 1.296cm^2 area and 0.13cm^3 volume. The  wound is limited to skin breakdown. There is no tunneling or undermining noted. There is a small amount of serous drainage noted. The wound margin is distinct with the outline attached to the wound base. There is no granulation within the wound bed. There is a large (67-100%) amount of necrotic tissue within the wound bed including Eschar. The periwound skin appearance exhibited: Erythema. The surrounding wound skin color is noted with erythema which is circumferential. Periwound temperature was noted as No Abnormality. Wound #2 status is Open. Original cause of wound was Gradually Appeared. The wound is located on the Left,Dorsal Toe Fifth. The wound measures 0.6cm length x 0.9cm width x 0.1cm depth; 0.424cm^2 area and 0.042cm^3 volume. The wound is limited to skin breakdown. There is no tunneling or undermining noted. There is a small amount of serous drainage noted. The wound margin is distinct with the outline attached to the wound base. There is no granulation within the wound bed. There is a large (67-100%) amount of necrotic tissue within the wound bed including Eschar. The periwound skin appearance exhibited: Erythema. The surrounding wound skin color is noted with erythema which is circumferential. Periwound temperature was noted as No Abnormality. The periwound has tenderness on palpation. Wound #3 status is Open. Original cause of wound was Surgical Injury. The wound is located on the Right Metatarsal head first. The wound measures 2cm length x 1.5cm width x 0.6cm depth; 2.356cm^2 area and 1.414cm^3 volume. There is fat and fascia exposed. There is no tunneling or undermining noted. There is a large amount of purulent drainage noted. The wound margin is flat and intact. There is no granulation within the wound bed. There is a large (67-100%) amount of necrotic tissue within the wound bed including Eschar and Adherent Slough. The periwound skin appearance exhibited: Localized Edema,  Moist, Erythema. The periwound skin appearance did not exhibit: Maceration. The surrounding wound skin color is noted with erythema which is circumferential. Periwound temperature was noted as No Abnormality. The periwound has tenderness on palpation. 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 Wound #2 Left,Dorsal Toe  Fifth: Clean wound with Normal Saline. Wound #3 Right Metatarsal head first: Clean wound with Normal Saline. Anesthetic: Wound #1 Left,Dorsal Toe Fourth: Topical Lidocaine 4% cream applied to wound bed prior to debridement - for clinic use only Wound #2 Left,Dorsal Toe Fifth: Topical Lidocaine 4% cream applied to wound bed prior to debridement - for clinic use only Wound #3 Right Metatarsal head first: Topical Lidocaine 4% cream applied to wound bed prior to debridement - for clinic use only Primary Wound Dressing: Wound #1 Left,Dorsal Toe Fourth: Other: - paint with betadine Wound #2 Left,Dorsal Toe Fifth: Other: - paint with betadine Wound #3 Right Metatarsal head first: Santyl Ointment Secondary Dressing: Wound #3 Right Metatarsal head first: Gauze, ABD and Kerlix/Conform - tape, stretch netting #4 Dressing Change Frequency: Wound #1 Left,Dorsal Toe Fourth: Change dressing every day. Wound #2 Left,Dorsal Toe Fifth: Change dressing every day. Wound #3 Right Metatarsal head first: Change dressing every day. Follow-up Appointments: Wound  #1 Left,Dorsal Toe Fourth: Return Appointment in 1 week. Wound #2 Left,Dorsal Toe Fifth: Return Appointment in 1 week. Wound #3 Right Metatarsal head first: Return Appointment in 1 week. Edema Control: [name], [name] ([id number]) Wound #1 Left,Dorsal Toe Fourth: Elevate legs to the level of the heart and pump ankles as often as possible Wound #2 Left,Dorsal Toe Fifth: Elevate legs to the level of the heart and pump ankles as often as possible Wound #3 Right Metatarsal head first: Elevate legs to the level of the heart and pump ankles as often as possible Off-Loading: Wound #3 Right Metatarsal head first: Other: - Keep foot elevated as much as possible. Additional Orders / Instructions: Wound #1 Left,Dorsal Toe Fourth: Increase protein intake. Wound #2 Left,Dorsal Toe Fifth: Increase protein intake. Wound #3 Right Metatarsal head first: Increase protein intake. Home Health: Wound #1 Left,Dorsal Toe Fourth: Continue Home Health Visits - [facility] Nurse may visit PRN to address patient s wound care needs. FACE TO FACE ENCOUNTER: MEDICARE and MEDICAID PATIENTS: I certify that this patient is under my care and that I had a face-to-face encounter that meets the physician face-to-face encounter requirements with this patient on this date. The encounter with the patient was in whole or in part for the following MEDICAL CONDITION: (primary reason for Home Healthcare) MEDICAL NECESSITY: I certify, that based on my findings, NURSING services are a medically necessary home health service. HOME BOUND STATUS: I certify that my clinical findings support that this patient is homebound (i.e., Due to illness or injury, pt requires aid of supportive devices such as crutches, cane, wheelchairs, walkers, the use of special transportation or the assistance of another person to leave their place of residence. There is a normal inability to leave the home and doing so requires  considerable and taxing effort. Other absences are for medical reasons / religious services and are infrequent or of short duration when for other reasons). If current dressing causes regression in wound condition, may D/C ordered dressing product/s and apply Normal Saline Moist Dressing daily until next Wound Healing Center / Other MD appointment. Notify Wound Healing Center of regression in wound condition at [phone]. Please direct any NON-WOUND related issues/requests for orders to patient's Primary Care Physician Wound #2 Left,Dorsal Toe Fifth: Continue Home Health Visits - [facility] Nurse may visit PRN to address patient s wound care needs. FACE TO FACE ENCOUNTER: MEDICARE and MEDICAID PATIENTS: I certify that this patient is under my care and that I had a face-to-face  encounter that meets the physician face-to-face encounter requirements with this patient on this date. The encounter with the patient was in whole or in part for the following MEDICAL CONDITION: (primary reason for Home Healthcare) MEDICAL NECESSITY: I certify, that based on my findings, NURSING services are a medically necessary home health service. HOME BOUND STATUS: I certify that my clinical findings support that this patient is homebound (i.e., Due to illness or injury, pt requires aid of supportive devices such as crutches, cane, wheelchairs, walkers, the use of special transportation or the assistance of another person to leave their place of residence. There is a normal inability to leave the home and doing so requires considerable and taxing effort. Other absences are for medical reasons / religious services and are infrequent or of short duration when for other reasons). If current dressing causes regression in wound condition, may D/C ordered dressing product/s and apply Normal Saline Moist Dressing daily until next Wound Healing Center / Other MD appointment. Notify Wound Healing Center  of regression in wound condition at [phone]. Please direct any NON-WOUND related issues/requests for orders to patient's Primary Care Physician [name], LAINEZ ([id number]) Wound #3 Right Metatarsal head first: Continue Home Health Visits - [facility] Nurse may visit PRN to address patient s wound care needs. FACE TO FACE ENCOUNTER: MEDICARE and MEDICAID PATIENTS: I certify that this patient is under my care and that I had a face-to-face encounter that meets the physician face-to-face encounter requirements with this patient on this date. The encounter with the patient was in whole or in part for the following MEDICAL CONDITION: (primary reason for Home Healthcare) MEDICAL NECESSITY: I certify, that based on my findings, NURSING services are a medically necessary home health service. HOME BOUND STATUS: I certify that my clinical findings support that this patient is homebound (i.e., Due to illness or injury, pt requires aid of supportive devices such as crutches, cane, wheelchairs, walkers, the use of special transportation or the assistance of another person to leave their place of residence. There is a normal inability to leave the home and doing so requires considerable and taxing effort. Other absences are for medical reasons / religious services and are infrequent or of short duration when for other reasons). If current dressing causes regression in wound condition, may D/C ordered dressing product/s and apply Normal Saline Moist Dressing daily until next Wound Healing Center / Other MD appointment. Notify Wound Healing Center of regression in wound condition at [phone]. 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 She is still under the care of the orthopedic surgeon Dr. [name] and I have asked her to see him in follow-up regarding possible revision debridement and application of a wound VAC. He has seen her recently but not recommended any surgical intervention After review of also recommended: 1. Vascular surgery consult with Dr. Edilia Bo, for possible surgical options -- Appointment pending  November 20 2. Infectious disease consult for possible IV antibiotics and appropriate care -- appointment still pending 3. Continue with Santyl ointment locally to be changed on a daily basis 4. She is already on oral doxycycline -- continue till she see's infectious disease 5. x-ray of left and right feet. she has been unable to do this and will do it sometime soon KERON, KOFFMAN ([id number]) 6. After complete optimization if she continues to have problems with her diabetic foot ulcer she may benefit from hyperbaric oxygen therapy and have briefly discussed this with her. I have had a discussion with the patient regarding her noncompliance and I have asked her to take charge of her health and be more involved with getting appointments. I have also told her that if she has any signs of sepsis, ascending cellulitis or significant change in her wound she should report to the ER immediately and get further care. Electronic Signature(s) Signed: 08/08/2016 1:26:51 PM By: Beth Kanner MD, FACS Entered By: [name] on 08/08/2016 13:26:51 [name]  ([id number]) -------------------------------------------------------------------------------- SuperBill Details Patient Name: [name] Date of Service: 08/08/2016 Medical Record Number: [id number] Patient Account Number: [id number] Date of Birth/Sex: Jun 17, 1981 (34 y.o. Female) Treating RN: [name] Primary Care Physician: [name] Other Clinician: Referring Physician: Elizabeth [name] Treating Physician/Extender: Rudene Re in Treatment: 1 Diagnosis Coding ICD-10 Codes Code Description E10.621 Type 1 diabetes mellitus with foot ulcer I70.235 Atherosclerosis of native arteries of right leg with ulceration of other part of foot L97.513 Non-pressure chronic ulcer of other part of right foot with necrosis of muscle L97.521 Non-pressure chronic ulcer of other part of left foot limited to breakdown of skin Z99.2 Dependence on renal dialysis T87.53 Necrosis of amputation stump, right lower extremity Facility Procedures CPT4 Code: [id number] Description: [phone] - WOUND CARE VISIT-LEV 2 EST PT Modifier: Quantity: 1 Physician Procedures CPT4: Description Modifier Quantity Code [id number] - WC PHYS LEVEL 3 - EST PT 1 ICD-10 Description Diagnosis E10.621 Type 1 diabetes mellitus with foot ulcer L97.513 Non-pressure chronic ulcer of other part of right foot with necrosis of muscle  I70.235 Atherosclerosis of native arteries of right leg with ulceration of other part of foot Electronic Signature(s) Signed: 08/08/2016 1:27:07 PM By: Beth Kanner MD, FACS Entered By: [name] on 08/08/2016 13:27:07

## 2016-08-09 NOTE — Progress Notes (Addendum)
WAYNA, MORILLO ([id number]) Visit Report for 08/08/2016 Arrival Information Details Patient Name: [name], [name] 08/08/2016 12:45 Date of Service: PM Medical Record [id number] Number: Patient Account Number: [id number] 1981/08/10 (36 y.o. Treating RN: Huel Coventry Date of Birth/Sex: Female) Other Clinician: Primary Care Physician: [name] Treating Evlyn Kanner Referring Physician: [name] Physician/Extender: Tania Ade in Treatment: 1 Visit Information History Since Last Visit Added or deleted any medications: No Patient Arrived: Wheel Chair Any new allergies or adverse reactions: No Arrival Time: 12:44 Had a fall or experienced change in No Accompanied By: daughter activities of daily living that may affect Transfer Assistance: None risk of falls: Patient Identification Verified: Yes Signs or symptoms of abuse/neglect No Secondary Verification Process Yes since last visito Completed: Hospitalized since last visit: No Patient Requires Transmission- No Has Dressing in Place as Prescribed: Yes Based Precautions: Has Footwear/Offloading in Place as Yes Patient Has Alerts: Yes Prescribed: Patient Alerts: DM II Right: Surgical Shoe with R leg non- Pressure Relief compressible Insole L leg non- Pain Present Now: Yes compressible Electronic Signature(s) Signed: 08/08/2016 4:48:58 PM By: Elliot Gurney, RN, BSN, Kim RN, BSN Entered By: Elliot Gurney, RN, BSN, Kim on 08/08/2016 12:44:36 Beth Douglas ([id number]) -------------------------------------------------------------------------------- Clinic Level of Care Assessment Details Patient Name: [name], [name] 08/08/2016 12:45 Date of Service: PM Medical Record [id number] Number: Patient Account Number: [id number] [birth year] (34 y.o. Treating RN: Huel Coventry Date of Birth/Sex: Female) Other Clinician: Primary Care Physician: [name] Treating Evlyn Kanner Referring Physician: [name] Physician/Extender: Tania Ade in  Treatment: 1 Clinic Level of Care Assessment Items TOOL 4 Quantity Score []  - Use when only an EandM is performed on FOLLOW-UP visit 0 ASSESSMENTS - Nursing Assessment / Reassessment []  - Reassessment of Co-morbidities (includes updates in patient status) 0 []  - Reassessment of Adherence to Treatment Plan 0 ASSESSMENTS - Wound and Skin Assessment / Reassessment []  - Simple Wound Assessment / Reassessment - one wound 0 X - Complex Wound Assessment / Reassessment - multiple wounds 1 5 []  - Dermatologic / Skin Assessment (not related to wound area) 0 ASSESSMENTS - Focused Assessment []  - Circumferential Edema Measurements - multi extremities 0 []  - Nutritional Assessment / Counseling / Intervention 0 []  - Lower Extremity Assessment (monofilament, tuning fork, pulses) 0 []  - Peripheral Arterial Disease Assessment (using hand held doppler) 0 ASSESSMENTS - Ostomy and/or Continence Assessment and Care []  - Incontinence Assessment and Management 0 []  - Ostomy Care Assessment and Management (repouching, etc.) 0 PROCESS - Coordination of Care []  - Simple Patient / Family Education for ongoing care 0 X - Complex (extensive) Patient / Family Education for ongoing care 1 20 []  - Staff obtains Chiropractor, Records, Test Results / Process Orders 0 []  - Staff telephones HHA, Nursing Homes / Clarify orders / etc 0 LAKYNN, BRIETZKE ([id number]) []  - Routine Transfer to another Facility (non-emergent condition) 0 []  - Routine Hospital Admission (non-emergent condition) 0 []  - New Admissions / Manufacturing engineer / Ordering NPWT, Apligraf, etc. 0 []  - Emergency Hospital Admission (emergent condition) 0 []  - Simple Discharge Coordination 0 X - Complex (extensive) Discharge Coordination 1 15 PROCESS - Special Needs []  - Pediatric / Minor Patient Management 0 []  - Isolation Patient Management 0 []  - Hearing / Language / Visual special needs 0 []  - Assessment of Community assistance (transportation, D/C  planning, etc.) 0 []  - Additional assistance / Altered mentation 0 []  - Support Surface(s) Assessment (bed, cushion, seat, etc.) 0 INTERVENTIONS - Wound Cleansing / Measurement X - Simple Wound Cleansing - one  wound 1 5 []  - Complex Wound Cleansing - multiple wounds 0 X - Wound Imaging (photographs - any number of wounds) 1 5 []  - Wound Tracing (instead of photographs) 0 X - Simple Wound Measurement - one wound 1 5 []  - Complex Wound Measurement - multiple wounds 0 INTERVENTIONS - Wound Dressings X - Small Wound Dressing one or multiple wounds 1 10 []  - Medium Wound Dressing one or multiple wounds 0 []  - Large Wound Dressing one or multiple wounds 0 []  - Application of Medications - topical 0 []  - Application of Medications - injection 0 Dzikowski, November ([id number]) INTERVENTIONS - Miscellaneous []  - External ear exam 0 []  - Specimen Collection (cultures, biopsies, blood, body fluids, etc.) 0 []  - Specimen(s) / Culture(s) sent or taken to Lab for analysis 0 []  - Patient Transfer (multiple staff / Michiel Sites Lift / Similar devices) 0 []  - Simple Staple / Suture removal (25 or less) 0 []  - Complex Staple / Suture removal (26 or more) 0 []  - Hypo / Hyperglycemic Management (close monitor of Blood Glucose) 0 []  - Ankle / Brachial Index (ABI) - do not check if billed separately 0 X - Vital Signs 1 5 Has the patient been seen at the hospital within the last three years: Yes Total Score: 70 Level Of Care: New/Established - Level 2 Electronic Signature(s) Signed: 08/08/2016 4:48:58 PM By: Elliot Gurney, RN, BSN, Kim RN, BSN Entered By: Elliot Gurney, RN, BSN, Kim on 08/08/2016 13:20:59 Beth Douglas ([id number]) -------------------------------------------------------------------------------- Encounter Discharge Information Details Patient Name: [name], [name] 08/08/2016 12:45 Date of Service: PM Medical Record [id number] Number: Patient Account Number: [id number] [birth year] (34 y.o. Treating RN: Huel Coventry Date of  Birth/Sex: Female) Other Clinician: Primary Care Physician: [name] Treating Evlyn Kanner Referring Physician: [name] Physician/Extender: Tania Ade in Treatment: 1 Encounter Discharge Information Items Discharge Pain Level: 0 Discharge Condition: Stable Ambulatory Status: Wheelchair Discharge Destination: Home Transportation: Private Auto Accompanied By: daughter Schedule Follow-up Appointment: Yes Medication Reconciliation completed and provided to Patient/Care Yes Purvi Ruehl: Provided on Clinical Summary of Care: 08/08/2016 Form Type Recipient Paper Patient IG Electronic Signature(s) Signed: 08/08/2016 4:48:58 PM By: Elliot Gurney RN, BSN, Kim RN, BSN Previous Signature: 08/08/2016 1:20:38 PM Version By: Gwenlyn Perking Entered By: Elliot Gurney RN, BSN, Kim on 08/08/2016 13:23:09 Beth Douglas ([id number]) -------------------------------------------------------------------------------- Lower Extremity Assessment Details Patient Name: [name], [name] 08/08/2016 12:45 Date of Service: PM Medical Record [id number] Number: Patient Account Number: [id number] 11/22/1980 (34 y.o. Treating RN: Huel Coventry Date of Birth/Sex: Female) Other Clinician: Primary Care Physician: [name] Treating Evlyn Kanner Referring Physician: [name] Physician/Extender: Tania Ade in Treatment: 1 Edema Assessment Assessed: [Left: No] [Right: No] E[Left: dema] [Right: :] Calf Left: Right: Point of Measurement: 26 cm From Medial Instep 49.6 cm 48.5 cm Ankle Left: Right: Point of Measurement: 10 cm From Medial Instep 28.5 cm 29 cm Vascular Assessment Pulses: Posterior Tibial Dorsalis Pedis Palpable: [Left:Yes] [Right:Yes] Extremity colors, hair growth, and conditions: Extremity Color: [Left:Hyperpigmented] [Right:Hyperpigmented] Hair Growth on Extremity: [Left:No] [Right:No] Temperature of Extremity: [Left:Warm] [Right:Warm] Capillary Refill: [Left:< 3 seconds] [Right:< 3  seconds] Dependent Rubor: [Left:No] [Right:No] Blanched when Elevated: [Left:No] [Right:No] Lipodermatosclerosis: [Left:No] [Right:No] Toe Nail Assessment Left: Right: Thick: No No Discolored: No No Deformed: No No Improper Length and Hygiene: No No Electronic VERNE, COVE ([id number]) Signed: 08/08/2016 4:48:58 PM By: Elliot Gurney, RN, BSN, Kim RN, BSN Entered By: Elliot Gurney, RN, BSN, Kim on 08/08/2016 12:54:00 Beth Douglas ([id number]) -------------------------------------------------------------------------------- Multi Wound Chart Details Patient Name: [name], [name] 08/08/2016 12:45 Date of Service:  PM Medical Record [id number] Number: Patient Account Number: [id number] 12/13/1980 (34 y.o. 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 the Lower Extremity Secondary Etiology: N/A N/A Open Surgical Wound Comorbid History: Anemia, Asthma, Chronic Anemia, Asthma, Chronic Anemia, Asthma, Chronic Obstructive Pulmonary Obstructive Pulmonary Obstructive Pulmonary Disease (COPD), Disease (COPD), Disease (COPD), Congestive Heart Failure, Congestive Heart Failure, Congestive Heart Failure, Hypertension, Myocardial Hypertension, Myocardial Hypertension, Myocardial Infarction, Type I Infarction, Type I Infarction, Type I Diabetes, End Stage Diabetes, End  Stage Diabetes, End Stage Renal Disease, Renal Disease, Renal Disease, Osteoarthritis, Neuropathy Osteoarthritis, Neuropathy Osteoarthritis, Neuropathy Date Acquired: 05/30/2016 05/30/2016 06/25/2016 Weeks of Treatment: 1 1 1  Wound Status: Open Open Open Pending Amputation on No No Yes Presentation: Measurements L x W x D 1.5x1.1x0.1 0.6x0.9x0.1 2x1.5x0.6 (cm) Area (cm) : 1.296 0.424 2.356 Volume (cm) : 0.13 0.042 1.414 Kalman, Jamecia ([id number]) % Reduction in Area: -267.10% 69.30% 53.80% % Reduction in Volume: -271.40% 69.60% 72.30% Classification: Grade 1 Grade 1 Grade 3 Exudate Amount: Small Small Large Exudate Type: Serous Serous Purulent Exudate Color: amber amber yellow, brown, green Wound Margin: Distinct, outline attached Distinct, outline attached Flat and Intact Granulation Amount: None Present (0%) None Present (0%) None Present (0%) Necrotic Amount: Large (67-100%) Large (67-100%) Large (67-100%) Necrotic Tissue: Eschar Eschar Eschar, Adherent Slough Exposed Structures: Fascia: No Fascia: No Fascia: Yes Fat: No Fat: No Fat: Yes Tendon: No Tendon: No Tendon: No Muscle: No Muscle: No Muscle: No Joint: No Joint: No Joint: No Bone: No Bone: No Bone: No Limited to Skin Limited to Skin Breakdown Breakdown Epithelialization: None None None Periwound Skin Texture: No Abnormalities Noted No Abnormalities Noted Edema: Yes Periwound Skin No Abnormalities Noted No Abnormalities Noted Moist: Yes Moisture: Maceration: No Periwound Skin Color: Erythema: Yes Erythema: Yes Erythema: Yes Erythema Location: Circumferential Circumferential Circumferential Temperature: No Abnormality No Abnormality No Abnormality Tenderness on No Yes Yes Palpation: Wound Preparation: Ulcer Cleansing: Ulcer Cleansing: Ulcer Cleansing: Rinsed/Irrigated with Rinsed/Irrigated with Rinsed/Irrigated with Saline Saline Saline Topical Anesthetic Topical Anesthetic Topical Anesthetic Applied:  Other: lidocaine Applied: Other: lidocaine Applied: Other: lidocaine 4% 4% 4% Treatment Notes Electronic Signature(s) Signed: 08/08/2016 4:48:58 PM By: Elliot Gurney, RN, BSN, Kim RN, BSN Entered By: Elliot Gurney, RN, BSN, Kim on 08/08/2016 12:58:01 Beth Douglas ([id number]) -------------------------------------------------------------------------------- Multi-Disciplinary Care Plan Details Patient Name: [name], [name] 08/08/2016 12:45 Date of Service: PM Medical Record [id number] Number: Patient Account Number: [id number] Sep 18, 1981 (34 y.o. Treating RN: Huel Coventry Date of Birth/Sex: Female) Other Clinician: Primary Care Physician: [name] Treating Evlyn Kanner Referring Physician: [name] Physician/Extender: Tania Ade in Treatment: 1 Active Inactive Electronic Signature(s) Signed: 09/16/2016 2:53:38 PM By: Elliot Gurney RN, BSN, Kim RN, BSN Previous Signature: 08/08/2016 4:48:58 PM Version By: Elliot Gurney RN, BSN, Kim RN, BSN Entered By: Elliot Gurney, RN, BSN, Kim on 09/16/2016 14:53:38 Beth Douglas ([id number]) -------------------------------------------------------------------------------- Pain Assessment Details Patient Name: [name], [name] 08/08/2016 12:45 Date of Service: PM Medical Record [id number] Number: Patient Account Number: [id number] 01/28/1981 (34 y.o. Treating RN: Huel Coventry Date of Birth/Sex: Female) Other Clinician: Primary Care Physician:  Dareen Piano, TERESA Treating Evlyn Kanner Referring Physician: [name] Physician/Extender: Tania Ade in Treatment: 1 Active Problems Location of Pain Severity and Description of Pain Patient Has Paino No Site Locations With Dressing Change: Yes Duration of the Pain. Constant / Intermittento Intermittent Rate the pain. Current Pain Level: 7 Pain Management and Medication Current Pain Management: Electronic Signature(s) Signed: 08/08/2016 4:48:58 PM By: Elliot Gurney, RN, BSN, Kim RN, BSN Entered By: Elliot Gurney, RN, BSN, Kim on 08/08/2016  12:45:10 Beth Douglas ([id number]) -------------------------------------------------------------------------------- Patient/Caregiver Education Details Patient Name: [name], [name] 08/08/2016 12:45 Date of Service: PM Medical Record [id number] Number: Patient Account Number: [id number] [birth year] (34 y.o. Treating RN: Huel Coventry Date of Birth/Gender: Female) Other Clinician: Primary Care Physician: [name] Treating Evlyn Kanner Referring Physician: [name] Physician/Extender: Tania Ade in Treatment: 1 Education Assessment Education Provided To: Caregiver Education Topics Provided [facility]: Wound/Skin Impairment: Handouts: Caring for Your Ulcer Methods: Demonstration Responses: State content correctly Electronic Signature(s) Signed: 08/08/2016 4:48:58 PM By: Elliot Gurney, RN, BSN, Kim RN, BSN Entered By: Elliot Gurney, RN, BSN, Kim on 08/08/2016 13:24:00 Beth Douglas ([id number]) -------------------------------------------------------------------------------- Wound Assessment Details Patient Name: [name], [name] 08/08/2016 12:45 Date of Service: PM Medical Record [id number] Number: Patient Account Number: [id number] 1981/06/15 (34 y.o. Treating RN: Huel Coventry Date of Birth/Sex: Female) Other Clinician: Primary Care Physician: [name] Treating Evlyn Kanner Referring Physician: [name] Physician/Extender: Tania Ade in Treatment: 1 Wound Status Wound Number: 1 Primary Diabetic Wound/Ulcer of the Lower Etiology: Extremity Wound Location: Left Toe Fourth - Dorsal Wound Open Wounding Event: Gradually Appeared Status: Date Acquired: 05/30/2016 Comorbid Anemia, Asthma, Chronic Obstructive Weeks Of Treatment: 1 History: Pulmonary Disease (COPD), Congestive Clustered Wound: No Heart Failure, Hypertension, Myocardial Infarction, Type I Diabetes, End Stage Renal Disease, Osteoarthritis, Neuropathy Photos Wound Measurements Length: (cm)  1.5 Width: (cm) 1.1 Depth: (cm) 0.1 Area: (cm) 1.296 Volume: (cm) 0.13 % Reduction in Area: -267.1% % Reduction in Volume: -271.4% Epithelialization: None Tunneling: No Undermining: No Wound Description Classification: Grade 1 Foul Odor Afte Wound Margin: Distinct, outline attached Exudate Amount: Small Exudate Type: Serous Exudate Color: amber r Cleansing: No Wound Bed Granulation Amount: None Present (0%) Exposed Structure Necrotic Amount: Large (67-100%) Fascia Exposed: No Bergin, Beth Lambert ([id number]) Necrotic Quality: Eschar Fat Layer Exposed: No Tendon Exposed: No Muscle Exposed: No Joint Exposed: No Bone Exposed: No Limited to Skin Breakdown Periwound Skin Texture Texture Color No Abnormalities Noted: No No Abnormalities Noted: No Erythema: Yes Moisture Erythema Location: Circumferential No Abnormalities Noted: No Temperature / Pain Temperature: No Abnormality Wound Preparation Ulcer Cleansing: Rinsed/Irrigated with Saline Topical Anesthetic Applied: Other: lidocaine 4%, Electronic Signature(s) Signed: 08/08/2016 4:48:58 PM By: Elliot Gurney, RN, BSN, Kim RN, BSN Entered By: Elliot Gurney, RN, BSN, Kim on 08/08/2016 12:55:32 Beth Douglas ([id number]) -------------------------------------------------------------------------------- Wound Assessment Details Patient Name: [name], [name] 08/08/2016 12:45 Date of Service: PM Medical Record [id number] Number: Patient Account Number: [id number] 1981/01/25 (34 y.o. Treating RN: Huel Coventry Date of Birth/Sex: Female) Other Clinician: Primary Care Physician: [name] Treating Evlyn Kanner Referring Physician: [name] Physician/Extender: Tania Ade in Treatment: 1 Wound Status Wound Number: 2 Primary Diabetic Wound/Ulcer of the Lower Etiology: Extremity Wound Location: Left Toe Fifth - Dorsal Wound Open Wounding Event: Gradually Appeared Status: Date Acquired: 05/30/2016 Comorbid Anemia, Asthma, Chronic  Obstructive Weeks Of Treatment: 1 History: Pulmonary Disease (COPD), Congestive Clustered Wound: No Heart Failure, Hypertension, Myocardial Infarction, Type I Diabetes, End Stage Renal Disease, Osteoarthritis, Neuropathy Photos Wound Measurements Length: (cm) 0.6 Width: (cm) 0.9 Depth: (cm) 0.1 Area: (cm) 0.424 Volume: (cm)  0.042 % Reduction in Area: 69.3% % Reduction in Volume: 69.6% Epithelialization: None Tunneling: No Undermining: No Wound Description Classification: Grade 1 Foul Odor Afte Wound Margin: Distinct, outline attached Exudate Amount: Small Exudate Type: Serous Exudate Color: amber r Cleansing: No Wound Bed Granulation Amount: None Present (0%) Exposed Structure Necrotic Amount: Large (67-100%) Fascia Exposed: No [name], [name] ([id number]) Necrotic Quality: Eschar Fat Layer Exposed: No Tendon Exposed: No Muscle Exposed: No Joint Exposed: No Bone Exposed: No Limited to Skin Breakdown Periwound Skin Texture Texture Color No Abnormalities Noted: No No Abnormalities Noted: No Erythema: Yes Moisture Erythema Location: Circumferential No Abnormalities Noted: No Temperature / Pain Temperature: No Abnormality Tenderness on Palpation: Yes Wound Preparation Ulcer Cleansing: Rinsed/Irrigated with Saline Topical Anesthetic Applied: Other: lidocaine 4%, Electronic Signature(s) Signed: 08/08/2016 4:48:58 PM By: Elliot GurneyWoody, RN, BSN, Kim RN, BSN Entered By: Elliot GurneyWoody, RN, BSN, Kim on 08/08/2016 12:56:28 [name], Daniyah ([id number]) -------------------------------------------------------------------------------- Wound Assessment Details Patient Name: [name], Nikelle 08/08/2016 12:45 Date of Service: PM Medical Record [id number] Number: Patient Account Number: [id number] [birth year] (34 y.o. Treating RN: Huel CoventryWoody, Kim Date of Birth/Sex: Female) Other Clinician: Primary Care Physician: Elizabeth PalauANDERSON, TERESA Treating Evlyn KannerBritto, Errol Referring Physician: Elizabeth PalauANDERSON,  TERESA Physician/Extender: Tania AdeWeeks in Treatment: 1 Wound Status Wound Number: 3 Primary Diabetic Wound/Ulcer of the Lower Etiology: Extremity Wound Location: Right Metatarsal head first Secondary Open Surgical Wound Wounding Event: Surgical Injury Etiology: Date Acquired: 06/25/2016 Wound Open Weeks Of Treatment: 1 Status: Clustered Wound: No Comorbid Anemia, Asthma, Chronic Obstructive Pending Amputation On Presentation History: Pulmonary Disease (COPD), Congestive Heart Failure, Hypertension, Myocardial Infarction, Type I Diabetes, End Stage Renal Disease, Osteoarthritis, Neuropathy Photos Wound Measurements Length: (cm) 2 Width: (cm) 1.5 Depth: (cm) 0.6 Area: (cm) 2.356 Volume: (cm) 1.414 % Reduction in Area: 53.8% % Reduction in Volume: 72.3% Epithelialization: None Tunneling: No Undermining: No Wound Description Classification: Grade 3 Foul Odor After Wound Margin: Flat and Intact Exudate Amount: Large Exudate Type: Purulent Exudate Color: yellow, brown, green Cleansing: No Wound Bed [name], Shenise ([id number]) Granulation Amount: None Present (0%) Exposed Structure Necrotic Amount: Large (67-100%) Fascia Exposed: Yes Necrotic Quality: Eschar, Adherent Slough Fat Layer Exposed: Yes Tendon Exposed: No Muscle Exposed: No Joint Exposed: No Bone Exposed: No Periwound Skin Texture Texture Color No Abnormalities Noted: No No Abnormalities Noted: No Localized Edema: Yes Erythema: Yes Erythema Location: Circumferential Moisture No Abnormalities Noted: No Temperature / Pain Maceration: No Temperature: No Abnormality Moist: Yes Tenderness on Palpation: Yes Wound Preparation Ulcer Cleansing: Rinsed/Irrigated with Saline Topical Anesthetic Applied: Other: lidocaine 4%, Electronic Signature(s) Signed: 08/08/2016 4:48:58 PM By: Elliot GurneyWoody, RN, BSN, Kim RN, BSN Entered By: Elliot GurneyWoody, RN, BSN, Kim on 08/08/2016 12:57:19 [name], Jermiah  ([id number]) -------------------------------------------------------------------------------- Vitals Details Patient Name: [name], Javonna 08/08/2016 12:45 Date of Service: PM Medical Record [id number] Number: Patient Account Number: [id number] [birth year] (34 y.o. Treating RN: Huel CoventryWoody, Kim Date of Birth/Sex: Female) Other Clinician: Primary Care Physician: Elizabeth PalauANDERSON, TERESA Treating Evlyn KannerBritto, Errol Referring Physician: Elizabeth PalauANDERSON, TERESA Physician/Extender: Tania AdeWeeks in Treatment: 1 Vital Signs Time Taken: 12:45 Temperature (F): 97.6 Height (in): 67 Pulse (bpm): 98 Weight (lbs): 262 Respiratory Rate (breaths/min): 18 Body Mass Index (BMI): 41 Blood Pressure (mmHg): 136/79 Reference Range: 80 - 120 mg / dl Electronic Signature(s) Signed: 08/08/2016 4:48:58 PM By: Elliot GurneyWoody, RN, BSN, Kim RN, BSN Entered By: Elliot GurneyWoody, RN, BSN, Kim on 08/08/2016 12:45:35

## 2016-08-11 ENCOUNTER — Encounter (INDEPENDENT_AMBULATORY_CARE_PROVIDER_SITE_OTHER): Payer: Self-pay | Admitting: Orthopedic Surgery

## 2016-08-11 ENCOUNTER — Ambulatory Visit (INDEPENDENT_AMBULATORY_CARE_PROVIDER_SITE_OTHER): Payer: Medicaid Other | Admitting: Family

## 2016-08-11 VITALS — Ht 67.0 in | Wt 254.0 lb

## 2016-08-11 DIAGNOSIS — IMO0002 Reserved for concepts with insufficient information to code with codable children: Secondary | ICD-10-CM

## 2016-08-11 DIAGNOSIS — I70261 Atherosclerosis of native arteries of extremities with gangrene, right leg: Secondary | ICD-10-CM

## 2016-08-11 DIAGNOSIS — I70263 Atherosclerosis of native arteries of extremities with gangrene, bilateral legs: Secondary | ICD-10-CM | POA: Insufficient documentation

## 2016-08-11 DIAGNOSIS — Z89411 Acquired absence of right great toe: Secondary | ICD-10-CM

## 2016-08-11 NOTE — Progress Notes (Signed)
Wound Care Note   Patient: [name]           Date of Birth: 1981-05-16           MRN: [id number]             PCP: Elizabeth PalauANDERSON,TERESA, FNP Visit Date: 08/11/2016   Assessment & Plan: Visit Diagnoses:  1. Great toe amputation status, right (HCC)   2. 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 Santyl dressings. Pack wound open with santyl. We will get her over to vascular for their input. Has seen Dr. Edilia Boickson in the past. Return in 2 weeks. Sooner should any concerns arise.   Follow-Up Instructions: Return in about 2 weeks (around 08/25/2016).  Orders:  Orders Placed This Encounter  Procedures  . Ambulatory referral to Vascular Surgery   No orders of the defined types were placed in this encounter.     Procedures: No notes on file   Clinical Data: No additional findings.   No images are attached to the encounter.   Subjective: Chief Complaint  Patient presents with  . Right Foot - Routine Post Op    06/25/16 right great toe amputation    Patient is s/p a right great toe amputation. Santyl dressing changes 3 times a week. Pt is currently on Doxycycline BID. The patient is non weight bearing in a wheelchair and post op shoe. She states that her blood sugars have been in good control this am reading at 128 and that she does not smoke. Feels the wound is getting no better.   States is seeing wound center as well. States was sent back to us for wound debridement.   Review of Systems  Constitutional: Negative for chills and fever.  Skin: Positive for wound.  All other systems reviewed and are negative.   Miscellaneous:  -Home Health Care: [facility] 3 x q week Santyl dressing changes   Objective: Vital Signs: Ht 5\' 7"  (1.702 m)   Wt 254 lb (115.2 kg)   BMI 39.78 kg/m   Physical Exam: 25 mm x 6mm open ulceration at distal site of GT amputation. This probes 5 mm deep. Does not probe to bone.  Wound bed 100% filled in with fibrinous exudative tissue. Patient declined debridement today. No drainage. No cellulitis. No sign of infection.   Specialty Comments: No specialty comments available.   PMFS History: Patient Active Problem List   Diagnosis Date Noted  . 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 Great toe amputation status, right (HCC) 08/01/2016  . Diabetic osteomyelitis (HCC)   . Gangrene (HCC)   . 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 Diabetic foot infection (HCC) 06/20/2016  . ESRD (end stage renal disease) on dialysis (HCC) 06/20/2016  . Diabetic foot ulcer (HCC) 06/20/2016  . Pre-operative clearance 10/12/2015  . Normal coronary arteries 10/12/2015  . NSTEMI- type 2, Troponin 11.2 05/11/2015  . Respiratory failure requiring intubation (HCC) 05/10/2015  . History of arthroscopy of right shoulder-05/10/15 05/10/2015  . Cellulitis of right lower extremity 09/21/2014  . CKD (chronic kidney disease) stage 5, GFR less than 15 ml/min (HCC) 09/21/2014  . Cellulitis of foot, right 09/21/2014  . Unspecified asthma(493.90) 10/25/2013  . Acute combined systolic and diastolic heart failure (HCC) 09/21/2013  . Non-ischemic cardiomyopathy - by echo 8/16- EF 35-40% 09/16/2013  . Morbid obesity-  09/16/2013  .  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) 09/15/2013  . Normocytic anemia 09/15/2013  . Lower extremity edema 09/15/2013  . Hyperlipidemia   . Hypertension    Past Medical History:  Diagnosis Date  . Anemia   . Arthritis    "left hand" (09/15/2013)  . Asthma   . Chronic bronchitis (HCC)    "just about q yr" (09/15/2013)  . Chronic kidney disease    "low kidney function" (09/15/2013)  . COPD (chronic obstructive pulmonary disease) (HCC)   . Coronary artery disease   . Hyperlipidemia   . Hypertension   . Migraine    "get them alot" (09/15/2013)  . Myocardial infarction 04/2015    NSTEMI  . Normal coronary arteries    by cardiac catheterization 09/20/13  . Pneumonia    "couple times; have it now" (09/15/2013)  . Shortness of breath    "just recently; related to the pneumonia" (09/15/2013)  . Type 1 diabetes mellitus (HCC)    type 2    Family History  Problem Relation Age of Onset  . Diabetes Mother   . Stroke Mother   . Hypertension Father   . Hyperlipidemia Father   . Cancer - Lung Father   . Diabetes Maternal Grandmother   . Cancer Paternal Grandmother    Past Surgical History:  Procedure Laterality Date  . AMPUTATION Right 06/25/2016   Procedure: Amputation Right Great Toe at the Metatarsophalangeal Joint;  Surgeon: Nadara Mustard, MD;  Location: [facility] OR;  Service: Orthopedics;  Laterality: Right;  . AV FISTULA PLACEMENT Left 03/27/2015   Procedure: CREATION RADIAL CEPHALIC ARTERIOVENOUS FISTULA;  Surgeon: Chuck Hint, MD;  Location: [facility] OR;  Service: Vascular;  Laterality: Left;  . AV FISTULA PLACEMENT Left 11/23/2015   Procedure:  LEFT ARM BASILIC VEIN TRANSPOSITION  ;  Surgeon: Chuck Hint, MD;  Location: [facility] OR;  Service: Vascular;  Laterality: Left;  . CESAREAN SECTION  1999; 2006  . CORONARY ANGIOGRAM  09/20/2013   Procedure: CORONARY ANGIOGRAM;  Surgeon: Runell Gess, MD;  Location: [facility] CATH LAB;  Service: Cardiovascular;;  . FINGER SURGERY Left 1985   3rd and 4th digits reconstructed after cut off" (09/15/2013)  . SHOULDER ARTHROSCOPY WITH BICEPSTENOTOMY Right 05/10/2015   Procedure: RIGHT SHOULDER ARTHROSCOPY WITH BICEPS TENOTOMY, DEBRIDEMENT LABRAL TEAR;  Surgeon: Jones Broom, MD;  Location: [facility] OR;  Service: Orthopedics;  Laterality: Right;  Right shoulder arthroscopy biceps tenotomy, debridement labral tear  . TONSILLECTOMY  1997  . TUBAL LIGATION  2006   Social History   Occupational History  . Student    Social History Main Topics  . Smoking status: Never Smoker  . Smokeless tobacco: Never Used  . Alcohol use No    . Drug use: No  . Sexual activity: Not Currently    Partners: Male    Birth control/ protection: Other-see comments     Comment: S/P tubal ligation

## 2016-08-13 ENCOUNTER — Encounter: Payer: Self-pay | Admitting: Vascular Surgery

## 2016-08-15 ENCOUNTER — Ambulatory Visit: Payer: Medicaid Other | Admitting: Surgery

## 2016-08-18 ENCOUNTER — Ambulatory Visit (HOSPITAL_COMMUNITY)
Admission: RE | Admit: 2016-08-18 | Discharge: 2016-08-18 | Disposition: A | Discharge status: Home / Self Care | Payer: Medicaid Other | Source: Ambulatory Visit | Attending: Vascular Surgery | Admitting: Vascular Surgery

## 2016-08-18 DIAGNOSIS — I70209 Unspecified atherosclerosis of native arteries of extremities, unspecified extremity: Secondary | ICD-10-CM | POA: Insufficient documentation

## 2016-08-18 DIAGNOSIS — L98499 Non-pressure chronic ulcer of skin of other sites with unspecified severity: Secondary | ICD-10-CM | POA: Insufficient documentation

## 2016-08-20 ENCOUNTER — Encounter: Payer: Medicaid Other | Admitting: Vascular Surgery

## 2016-08-25 ENCOUNTER — Other Ambulatory Visit: Payer: Self-pay | Admitting: Surgery

## 2016-08-25 ENCOUNTER — Ambulatory Visit
Admission: RE | Admit: 2016-08-25 | Discharge: 2016-08-25 | Disposition: A | Discharge status: Home / Self Care | Payer: Medicaid Other | Source: Ambulatory Visit | Attending: Surgery | Admitting: Surgery

## 2016-08-25 ENCOUNTER — Ambulatory Visit (INDEPENDENT_AMBULATORY_CARE_PROVIDER_SITE_OTHER): Payer: Medicaid Other | Admitting: Orthopedic Surgery

## 2016-08-25 DIAGNOSIS — X58XXXD Exposure to other specified factors, subsequent encounter: Secondary | ICD-10-CM | POA: Insufficient documentation

## 2016-08-25 DIAGNOSIS — B999 Unspecified infectious disease: Secondary | ICD-10-CM

## 2016-08-25 DIAGNOSIS — T8131XA Disruption of external operation (surgical) wound, not elsewhere classified, initial encounter: Secondary | ICD-10-CM

## 2016-08-25 DIAGNOSIS — S91302D Unspecified open wound, left foot, subsequent encounter: Secondary | ICD-10-CM | POA: Insufficient documentation

## 2016-08-25 DIAGNOSIS — S91301D Unspecified open wound, right foot, subsequent encounter: Secondary | ICD-10-CM | POA: Diagnosis not present

## 2016-08-25 DIAGNOSIS — I70208 Unspecified atherosclerosis of native arteries of extremities, other extremity: Secondary | ICD-10-CM | POA: Insufficient documentation

## 2016-08-25 DIAGNOSIS — M869 Osteomyelitis, unspecified: Secondary | ICD-10-CM

## 2016-08-25 DIAGNOSIS — I70263 Atherosclerosis of native arteries of extremities with gangrene, bilateral legs: Secondary | ICD-10-CM

## 2016-08-25 MED ORDER — OXYCODONE-ACETAMINOPHEN 5-325 MG PO TABS: 1.0000 | ORAL_TABLET | ORAL | 0 refills | Status: DC | PRN

## 2016-08-25 MED ORDER — DOXYCYCLINE HYCLATE 100 MG PO TABS: 100.0000 mg | ORAL_TABLET | Freq: Two times a day (BID) | ORAL | 0 refills | Status: DC

## 2016-08-25 MED ORDER — SILVER SULFADIAZINE 1 % EX CREA: 1.0000 "application " | TOPICAL_CREAM | Freq: Every day | CUTANEOUS | 3 refills | Status: DC

## 2016-08-25 NOTE — Progress Notes (Addendum)
Office Visit Note   Patient: [name]           Date of Birth: [birth year]           MRN: [id number] Visit Date: 08/25/2016              Requested by: [name], FNP [address] [place], [place] [address] PCP: [name], FNP   Assessment & Plan: Visit Diagnoses:  1. 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 Disruption or dehiscence of closure of skin, initial encounter     Plan: Patient's ischemic changes on both lower extremities is getting worse. Her ischemic open wound from the great toe amputation is larger with necrotic tissue there is no ascending cellulitis. The ischemic changes over the fourth and fifth toe of the left foot are also larger with a increased ischemic area. Patient does have a follow-up with vascular vein surgery she has had her veins mapped and hopefully she is really candidate for revascularization. Prescription was called in to renew her doxycycline prescription called in for Silvadene will have home health nursing work with her weekly. Patient is also use nitroglycerin patches on both feet without any improvement of her microcirculation.  Follow-Up Instructions: Return in about 2 weeks (around 09/08/2016).   Orders:  No orders of the defined types were placed in this encounter.  Meds ordered this encounter  Medications  . oxyCODONE-acetaminophen (PERCOCET/ROXICET) 5-325 MG tablet    Sig: Take 1 tablet by mouth every 4 (four) hours as needed for severe pain.    Dispense:  60 tablet    Refill:  0  . silver sulfADIAZINE (SILVADENE) 1 % cream    Sig: Apply 1 application topically daily. Apply to affected area daily plus dry dressing    Dispense:  400 g    Refill:  3  . doxycycline (VIBRA-TABS) 100 MG tablet    Sig: Take 1 tablet (100 mg total) by mouth 2 (two) times daily.    Dispense:  60 tablet    Refill:  0      Procedures: No procedures performed   Clinical Data: No  additional findings.   Subjective: Chief Complaint  Patient presents with  . Right Foot - Wound Check    Right Great toe amputation ulceration  . Left Foot - Wound Check    Left foot 4th and 5th toe ulceration.    Patient presents today for follow up right foot. She is status post amputation of right great toe at MTP joint on 06/25/16. She is 2 months post op. She had wound dehiscence. There is 100 percent fibrinous tissue. She feels the wound is continuing to worsen. She is being seen at the wound center. Santyl dressing changes applied daily. There is no odor.  There is depth. The patient also has multiple toe ulcerations of left foot. There is an increase in redness. Patients states wounds were present when she was in the hospital. Dry dressing changes daily.    Wound Check     Review of Systems   Objective: Vital Signs: LMP 08/06/2016   Physical Exam patient's is seen in follow-up for right great toe amputation. Examination she has increased ischemic changes around the ulcer. There is no ascending cellulitis. The left foot shows ischemic changes over the fourth and fifth toes which is also worsening on the left foot. She does have a palpable dorsalis pedis pulse bilaterally but definitely has severe microcirculatory disease  most likely consistent with microcirculatory disease which show requires her to be on dialysis at this time. She will follow up with vascular vein surgery we will set up home health nursing for wound care  Ortho Exam  Specialty Comments:  No specialty comments available.  Imaging: Dg Foot Complete Left  Addendum Date: 08/25/2016   ADDENDUM REPORT: 08/25/2016 12:39 ADDENDUM: In the Impression #2 above I stated "right foot" when in fact the impression should read "no acute bony or soft tissue abnormality of the left foot". Electronically Signed   By: David  [place] M.D.   On: 08/25/2016 12:39   Result Date: 08/25/2016 CLINICAL DATA:  Nonhealing wound on the  right foot. Reopening of the amputation site of the right great toe. History of diabetes. EXAM: LEFT FOOT - COMPLETE 3+ VIEW; RIGHT FOOT COMPLETE - 3+ VIEW COMPARISON:  Right foot series of June 20, 2016. FINDINGS: Right foot: The patient has undergone amputation of the great toe as well as of the head of the first metatarsal. There is an open wound at the amputation site. The second through fifth digits appear intact as do the metatarsals. There are vascular calcifications noted. The bones of the hindfoot exhibit no acute abnormalities. There is soft tissue swelling medially and dorsally over the midfoot and forefoot. Left foot: The bones of the left foot are subjectively adequately mineralized. No loss of the cortical margins of the digits or metatarsals is observed. The soft tissues are not abnormally swollen. There are vascular calcifications. The bones of the hindfoot exhibit no significant abnormalities. There is no significant soft tissue swelling. IMPRESSION: 1. Open wound over the amputation site of the right great toe. No significant soft tissue gas more proximal than the actual cutaneous wound. No objective evidence of osteomyelitis involving the second through fifth digits. 2. No acute bony or soft tissue abnormality of the right foot. 3. Arterial calcifications in both feet. Electronically Signed: By: David  [place] M.D. On: 08/25/2016 09:33   Dg Foot Complete Right  Addendum Date: 08/25/2016   ADDENDUM REPORT: 08/25/2016 12:39 ADDENDUM: In the Impression #2 above I stated "right foot" when in fact the impression should read "no acute bony or soft tissue abnormality of the left foot". Electronically Signed   By: David  [place] M.D.   On: 08/25/2016 12:39   Result Date: 08/25/2016 CLINICAL DATA:  Nonhealing wound on the right foot. Reopening of the amputation site of the right great toe. History of diabetes. EXAM: LEFT FOOT - COMPLETE 3+ VIEW; RIGHT FOOT COMPLETE - 3+ VIEW COMPARISON:  Right  foot series of June 20, 2016. FINDINGS: Right foot: The patient has undergone amputation of the great toe as well as of the head of the first metatarsal. There is an open wound at the amputation site. The second through fifth digits appear intact as do the metatarsals. There are vascular calcifications noted. The bones of the hindfoot exhibit no acute abnormalities. There is soft tissue swelling medially and dorsally over the midfoot and forefoot. Left foot: The bones of the left foot are subjectively adequately mineralized. No loss of the cortical margins of the digits or metatarsals is observed. The soft tissues are not abnormally swollen. There are vascular calcifications. The bones of the hindfoot exhibit no significant abnormalities. There is no significant soft tissue swelling. IMPRESSION: 1. Open wound over the amputation site of the right great toe. No significant soft tissue gas more proximal than the actual cutaneous wound. No objective evidence of osteomyelitis involving the  second through fifth digits. 2. No acute bony or soft tissue abnormality of the right foot. 3. Arterial calcifications in both feet. Electronically Signed: By: David  [place] M.D. On: 08/25/2016 09:33     PMFS History: Patient Active Problem List   Diagnosis Date Noted  . 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 Great toe amputation status, right (HCC) 08/01/2016  . Diabetic osteomyelitis (HCC)   . Gangrene (HCC)   . 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 Diabetic foot infection (HCC) 06/20/2016  . ESRD (end stage renal disease) on dialysis (HCC) 06/20/2016  . Diabetic foot ulcer (HCC) 06/20/2016  . Pre-operative clearance 10/12/2015  . Normal coronary arteries 10/12/2015  . NSTEMI- type 2, Troponin 11.2 05/11/2015  . Respiratory failure requiring intubation (HCC) 05/10/2015  . History of arthroscopy of right shoulder-05/10/15 05/10/2015  .  Cellulitis of right lower extremity 09/21/2014  . CKD (chronic kidney disease) stage 5, GFR less than 15 ml/min (HCC) 09/21/2014  . Cellulitis of foot, right 09/21/2014  . Unspecified asthma(493.90) 10/25/2013  . Acute combined systolic and diastolic heart failure (HCC) 09/21/2013  . Non-ischemic cardiomyopathy - by echo 8/16- EF 35-40% 09/16/2013  . Morbid obesity-  09/16/2013  . 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) 09/15/2013  . Normocytic anemia 09/15/2013  . Lower extremity edema 09/15/2013  . Hyperlipidemia   . Hypertension    Past Medical History:  Diagnosis Date  . Anemia   . Arthritis    "left hand" (09/15/2013)  . Asthma   . Chronic bronchitis (HCC)    "just about q yr" (09/15/2013)  . Chronic kidney disease    "low kidney function" (09/15/2013)  . COPD (chronic obstructive pulmonary disease) (HCC)   . Coronary artery disease   . Hyperlipidemia   . Hypertension   . Migraine    "get them alot" (09/15/2013)  . Myocardial infarction 04/2015   NSTEMI  . Normal coronary arteries    by cardiac catheterization 09/20/13  . Pneumonia    "couple times; have it now" (09/15/2013)  . Shortness of breath    "just recently; related to the pneumonia" (09/15/2013)  . Type 1 diabetes mellitus (HCC)    type 2    Family History  Problem Relation Age of Onset  . Diabetes Mother   . Stroke Mother   . Hypertension Father   . Hyperlipidemia Father   . Cancer - Lung Father   . Diabetes Maternal Grandmother   . Cancer Paternal Grandmother     Past Surgical History:  Procedure Laterality Date  . AMPUTATION Right 06/25/2016   Procedure: Amputation Right Great Toe at the Metatarsophalangeal Joint;  Surgeon: Nadara MustardMarcus V Duda, MD;  [facility];  Service: Orthopedics;  Laterality: Right;  . AV FISTULA PLACEMENT Left 03/27/2015   Procedure: CREATION RADIAL CEPHALIC ARTERIOVENOUS FISTULA;  Surgeon: Chuck Hinthristopher S Dickson, MD;  [facility];  Service: Vascular;   Laterality: Left;  . AV FISTULA PLACEMENT Left 11/23/2015   Procedure:  LEFT ARM BASILIC VEIN TRANSPOSITION  ;  Surgeon: Chuck Hinthristopher S Dickson, MD;  [facility];  Service: Vascular;  Laterality: Left;  . CESAREAN SECTION  1999; 2006  . CORONARY ANGIOGRAM  09/20/2013   Procedure: CORONARY ANGIOGRAM;  Surgeon: Runell GessJonathan J Berry, MD;  [facility];  Service: Cardiovascular;;  . FINGER SURGERY Left 1985   3rd and 4th digits reconstructed after cut off" (09/15/2013)  . SHOULDER ARTHROSCOPY WITH  BICEPSTENOTOMY Right 05/10/2015   Procedure: RIGHT SHOULDER ARTHROSCOPY WITH BICEPS TENOTOMY, DEBRIDEMENT LABRAL TEAR;  Surgeon: Jones Broom, MD;  Location: [facility] OR;  Service: Orthopedics;  Laterality: Right;  Right shoulder arthroscopy biceps tenotomy, debridement labral tear  . TONSILLECTOMY  1997  . TUBAL LIGATION  2006   Social History   Occupational History  . Student    Social History Main Topics  . Smoking status: Never Smoker  . Smokeless tobacco: Never Used  . Alcohol use No  . Drug use: No  . Sexual activity: Not Currently    Partners: Male    Birth control/ protection: Other-see comments     Comment: S/P tubal ligation

## 2016-08-26 ENCOUNTER — Other Ambulatory Visit (INDEPENDENT_AMBULATORY_CARE_PROVIDER_SITE_OTHER): Payer: Self-pay | Admitting: Radiology

## 2016-08-26 DIAGNOSIS — I70261 Atherosclerosis of native arteries of extremities with gangrene, right leg: Secondary | ICD-10-CM

## 2016-08-27 ENCOUNTER — Encounter: Payer: Self-pay | Admitting: Vascular Surgery

## 2016-08-28 ENCOUNTER — Encounter: Payer: Self-pay | Admitting: Vascular Surgery

## 2016-08-28 ENCOUNTER — Ambulatory Visit (INDEPENDENT_AMBULATORY_CARE_PROVIDER_SITE_OTHER): Payer: Medicaid Other | Admitting: Vascular Surgery

## 2016-08-28 VITALS — BP 118/77 | HR 70 | Temp 97.3°F | Resp 18 | Ht 68.0 in | Wt 247.0 lb

## 2016-08-28 DIAGNOSIS — I739 Peripheral vascular disease, unspecified: Secondary | ICD-10-CM

## 2016-08-28 NOTE — Progress Notes (Signed)
History of Present Illness:  Patient is a 35 y.o. year old female who presents for evaluation of bilateral ischemic foot wounds.  Dr. 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  The amputation site has not healed.  The patient currently describes a painful sensation in the bilateral lower extremity with rest and with weight bearing activity. Her primary mode of mobility is a WC, prior to her toe amputation in September she was ambulatory.   Atherosclerotic risk factors and other medical problems include Juvenile DM, ESRD on HD, NSTEMI, HTN.  She is currently medically managed with a beta blocker and Statin daily.    Past Medical History:  Diagnosis Date  . Anemia   . Arthritis    "left hand" (09/15/2013)  . Asthma   . Chronic bronchitis (HCC)    "just about q yr" (09/15/2013)  . Chronic kidney disease    "low kidney function" (09/15/2013)  . COPD (chronic obstructive pulmonary disease) (HCC)   . Coronary artery disease   . Hyperlipidemia   . Hypertension   . Migraine    "get them alot" (09/15/2013)  . Myocardial infarction 04/2015   NSTEMI  . Normal coronary arteries    by cardiac catheterization 09/20/13  . Pneumonia    "couple times; have it now" (09/15/2013)  . Shortness of breath    "just recently; related to the pneumonia" (09/15/2013)  . Type 1 diabetes mellitus (HCC)    type 2    Past Surgical History:  Procedure Laterality Date  . AMPUTATION Right 06/25/2016   Procedure: Amputation Right Great Toe at the Metatarsophalangeal Joint;  Surgeon: Nadara Mustard, MD;  Location: [facility] OR;  Service: Orthopedics;  Laterality: Right;  . AV FISTULA PLACEMENT Left 03/27/2015   Procedure: CREATION RADIAL CEPHALIC ARTERIOVENOUS FISTULA;  Surgeon: Chuck Hint, MD;  Location: [facility] OR;  Service: Vascular;  Laterality: Left;  . AV FISTULA PLACEMENT Left 11/23/2015   Procedure:  LEFT ARM BASILIC VEIN TRANSPOSITION  ;   Surgeon: Chuck Hint, MD;  Location: [facility] OR;  Service: Vascular;  Laterality: Left;  . CESAREAN SECTION  1999; 2006  . CORONARY ANGIOGRAM  09/20/2013   Procedure: CORONARY ANGIOGRAM;  Surgeon: Runell Gess, MD;  Location: [facility] CATH LAB;  Service: Cardiovascular;;  . FINGER SURGERY Left 1985   3rd and 4th digits reconstructed after cut off" (09/15/2013)  . SHOULDER ARTHROSCOPY WITH BICEPSTENOTOMY Right 05/10/2015   Procedure: RIGHT SHOULDER ARTHROSCOPY WITH BICEPS TENOTOMY, DEBRIDEMENT LABRAL TEAR;  Surgeon: Jones Broom, MD;  Location: [facility] OR;  Service: Orthopedics;  Laterality: Right;  Right shoulder arthroscopy biceps tenotomy, debridement labral tear  . TONSILLECTOMY  1997  . TUBAL LIGATION  2006    ROS:   General:  No weight loss, Fever, chills  HEENT: No recent headaches, no nasal bleeding, no visual changes, no sore throat  Neurologic: No dizziness, blackouts, seizures. No recent symptoms of stroke or mini- stroke. No recent episodes of slurred speech, or temporary blindness.  Cardiac: No recent episodes of chest pain/pressure, no shortness of breath at rest.  No shortness of breath with exertion.  Denies history of atrial fibrillation or irregular heartbeat  Vascular: No history of rest pain in feet.  No history of claudication.  No history of non-healing ulcer, No history of DVT   Pulmonary: No home oxygen, no productive cough, no hemoptysis,  No asthma or wheezing  Musculoskeletal:  [ ]  Arthritis, [ ]  Low  back pain,  [ ]  Joint pain  Hematologic:No history of hypercoagulable state.  No history of easy bleeding.  No history of anemia  Gastrointestinal: No hematochezia or melena,  No gastroesophageal reflux, no trouble swallowing  Urinary: [ ]  chronic Kidney disease, [ ]  on HD - [ ]  MWF or [ ]  TTHS, [ ]  Burning with urination, [ ]  Frequent urination, [ ]  Difficulty urinating;   Skin: No rashes  Psychological: No history of anxiety,  No history of  depression  Social History Social History  Substance Use Topics  . Smoking status: Never Smoker  . Smokeless tobacco: Never Used  . Alcohol use No    Family History Family History  Problem Relation Age of Onset  . Diabetes Mother   . Stroke Mother   . Hypertension Father   . Hyperlipidemia Father   . Cancer - Lung Father   . Diabetes Maternal Grandmother   . Cancer Paternal Grandmother     Allergies  Allergies  Allergen Reactions  . Aspirin Anaphylaxis  . Sulfur Hives  . Tramadol Hives and Other (See Comments)    Pt states she feels weird      Current Outpatient Prescriptions  Medication Sig Dispense Refill  . albuterol (PROVENTIL HFA;VENTOLIN HFA) 108 (90 BASE) MCG/ACT inhaler Inhale 2 puffs into the lungs every 6 (six) hours as needed for wheezing or shortness of breath.     Marland Kitchen albuterol (PROVENTIL) (2.5 MG/3ML) 0.083% nebulizer solution Take 2.5 mg by nebulization every 6 (six) hours as needed for wheezing or shortness of breath.    Marland Kitchen atorvastatin (LIPITOR) 40 MG tablet Take 40 mg by mouth at bedtime.    . budesonide (PULMICORT) 0.25 MG/2ML nebulizer solution Take 0.25 mg by nebulization 2 (two) times daily as needed (shortness of breath or wheezing).    . calcitRIOL (ROCALTROL) 0.25 MCG capsule Take 0.25 mcg by mouth daily.    . carvedilol (COREG) 25 MG tablet Take 25 mg by mouth 2 (two) times daily.    . collagenase (SANTYL) ointment Apply 1 application topically daily. 15 g 0  . doxycycline (VIBRA-TABS) 100 MG tablet Take 1 tablet (100 mg total) by mouth 2 (two) times daily. 60 tablet 0  . DULoxetine (CYMBALTA) 30 MG capsule Take 1 capsule (30 mg total) by mouth daily. Qam 30 capsule 3  . hydrOXYzine (ATARAX/VISTARIL) 25 MG tablet Take 1 tablet (25 mg total) by mouth every 6 (six) hours. 24 tablet 0  . insulin aspart (NOVOLOG FLEXPEN) 100 UNIT/ML FlexPen Inject 12-20 Units into the skin 3 (three) times daily with meals. Per sliding scale - based on carb count and  CBG    . Insulin Glargine (LANTUS SOLOSTAR) 100 UNIT/ML Solostar Pen Inject 10 Units into the skin at bedtime. 15 mL 11  . isosorbide dinitrate (ISORDIL) 20 MG tablet Take 20 mg by mouth 3 (three) times daily.    . methocarbamol (ROBAXIN) 500 MG tablet Take 1 tablet (500 mg total) by mouth every 8 (eight) hours as needed for muscle spasms. 30 tablet 0  . nitroGLYCERIN (NITRODUR - DOSED IN MG/24 HR) 0.2 mg/hr patch Place 1 patch (0.2 mg total) onto the skin daily. 30 patch 3  . oxyCODONE (OXY IR/ROXICODONE) 5 MG immediate release tablet Take 1 tablet (5 mg total) by mouth every 6 (six) hours as needed for breakthrough pain. 20 tablet 0  . oxyCODONE (OXY IR/ROXICODONE) 5 MG immediate release tablet Take 1 tablet (5 mg total) by mouth every 4 (four) hours  as needed for severe pain. 60 tablet 0  . oxyCODONE (ROXICODONE) 5 MG immediate release tablet Take 1 tablet (5 mg total) by mouth every 4 (four) hours as needed for severe pain. 20 tablet 0  . oxyCODONE-acetaminophen (PERCOCET/ROXICET) 5-325 MG tablet Take 1 tablet by mouth every 4 (four) hours as needed for severe pain. 60 tablet 0  . pregabalin (LYRICA) 50 MG capsule Take 1 capsule (50 mg total) by mouth 3 (three) times daily. 90 capsule 1  . sevelamer (RENAGEL) 800 MG tablet Take 800 mg by mouth daily.    . silver sulfADIAZINE (SILVADENE) 1 % cream Apply 1 application topically daily. Apply to affected area daily plus dry dressing 400 g 3   No current facility-administered medications for this visit.     Physical Examination  Vitals:   08/28/16 1253  BP: 118/77  Pulse: 70  Resp: 18  Temp: 97.3 F (36.3 C)  TempSrc: Oral  SpO2: 95%  Weight: 247 lb (112 kg)  Height: 5\' 8"  (1.727 m)    Body mass index is 37.56 kg/m.  General:  Alert and oriented, no acute distress HEENT: Normal Neck: No bruit or JVD Pulmonary: Clear to auscultation bilaterally Cardiac: Regular Rate and Rhythm without murmur Abdomen: Soft, non-tender,  non-distended, no mass, no scars Skin: No rash, healed abdominal wound, left foot 4/5 digits with ischemic changes Gangrenous tips of toes and right 1 st ray amputation site non healing wound wound is 4 x 5 cm with fibrinous exudate at the base  Extremity Pulses:  2+ radial, non palpable femoral, dorsalis pedis, posterior tibial  bilaterally Musculoskeletal: No deformity or edema  Neurologic: Upper and lower extremity motor 5/5 and symmetric  DATA:  08/18/2016 Arterial duplex Right Common femoral to SFA Biphasic to monophasic demonstrates SFA stenosis Left Proximal SFA biphasic to monophasic als demonstrates SFA stenosis.   06/21/2016 Arterial pressure indices:  +-----------------+---------+--------------+----------+ Location         Pressure Brachial indexWaveform   +-----------------+---------+--------------+----------+ Right ant tibial 255 mm Hg1.62          Monophasic +-----------------+---------+--------------+----------+ Right post tibial255 mm Hg1.62          Biphasic   +-----------------+---------+--------------+----------+ Left ant tibial  255 mm Hg1.62          Triphasic  +-----------------+---------+--------------+----------+ Left post tibial 255 mm Hg1.62          Triphasic  +-----------------+---------+--------------+----------+  ------------------------------------------------------------------- Summary: Bilateral ABI non-compressible. Right pedal artery waveforms appear abnormal.  ASSESSMENT:  PAD with bilateral feet ischemic changes   PLAN:  She does not have palpable femoral or pedal pulses.  Her arterial duplex demonstrates states SFA stenosis.  She has ESRD and is on HD.  She reports no contrast dye allergy.  We will plan angiogram with bilateral run off and possible intervention Dec. 8 th 2017 by Dr. Darrick PennaFields.   Thomasena EdisOLLINS, EMMA MAUREEN PA-C [facility]  The patient was seen in conjunction with Dr.  Darrick PennaFields  History and exam findings as above. 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 Arteriogram intervention scheduled for 09/06/2015. Patient did not have time to have the arteriogram performed tomorrow. Risks benefits possible complications and procedure details including but not limited to bleeding infection vessel injury were explained the patient today. She understands and agrees to proceed.  Fabienne Brunsharles Fields, MD [facility] Office: [phone] Pager: [phone]

## 2016-08-29 ENCOUNTER — Other Ambulatory Visit: Payer: Self-pay

## 2016-09-02 ENCOUNTER — Telehealth (INDEPENDENT_AMBULATORY_CARE_PROVIDER_SITE_OTHER): Payer: Self-pay | Admitting: Orthopedic Surgery

## 2016-09-02 NOTE — Telephone Encounter (Signed)
[name] w/[facility] calling in ref to referral for Home Health.  Patient has been discharged... on 08/27/16 (with Medicaid, visits are limited when patient is independent with wound care)

## 2016-09-02 NOTE — Telephone Encounter (Signed)
noted 

## 2016-09-03 ENCOUNTER — Ambulatory Visit (INDEPENDENT_AMBULATORY_CARE_PROVIDER_SITE_OTHER): Payer: Medicaid Other | Admitting: Internal Medicine

## 2016-09-03 ENCOUNTER — Encounter: Payer: Self-pay | Admitting: Internal Medicine

## 2016-09-03 DIAGNOSIS — L089 Local infection of the skin and subcutaneous tissue, unspecified: Secondary | ICD-10-CM

## 2016-09-03 DIAGNOSIS — E1169 Type 2 diabetes mellitus with other specified complication: Secondary | ICD-10-CM | POA: Diagnosis present

## 2016-09-03 DIAGNOSIS — E11628 Type 2 diabetes mellitus with other skin complications: Secondary | ICD-10-CM

## 2016-09-03 MED ORDER — VANCOMYCIN HCL IN DEXTROSE 1-5 GM/200ML-% IV SOLN: 1000.0000 mg | INTRAVENOUS | Status: DC

## 2016-09-03 MED ORDER — METRONIDAZOLE 500 MG PO TABS: 500.0000 mg | ORAL_TABLET | Freq: Two times a day (BID) | ORAL | 1 refills | Status: DC

## 2016-09-03 MED ORDER — CIPROFLOXACIN HCL 500 MG PO TABS: 500.0000 mg | ORAL_TABLET | Freq: Every day | ORAL | 1 refills | Status: DC

## 2016-09-03 NOTE — Progress Notes (Signed)
[facility]  Reason for Consult: Diabetic foot infection Referring Physician: Dr. Aldean BakerMarcus Duda  Patient Active Problem List   Diagnosis Date Noted  . Great toe amputation status, right (HCC) 08/01/2016    Priority: High  . Diabetic osteomyelitis (HCC)     Priority: High  . Diabetic foot infection (HCC) 06/20/2016    Priority: High  . 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 Gangrene (HCC)   . 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 ESRD (end stage renal disease) on dialysis (HCC) 06/20/2016  . Diabetic foot ulcer (HCC) 06/20/2016  . Pre-operative clearance 10/12/2015  . Normal coronary arteries 10/12/2015  . NSTEMI- type 2, Troponin 11.2 05/11/2015  . Respiratory failure requiring intubation (HCC) 05/10/2015  . History of arthroscopy of right shoulder-05/10/15 05/10/2015  . CKD (chronic kidney disease) stage 5, GFR less than 15 ml/min (HCC) 09/21/2014  . Unspecified asthma(493.90) 10/25/2013  . Acute combined systolic and diastolic heart failure (HCC) 09/21/2013  . Non-ischemic cardiomyopathy - by echo 8/16- EF 35-40% 09/16/2013  . Morbid obesity-  09/16/2013  . 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) 09/15/2013  . Normocytic anemia 09/15/2013  . Lower extremity edema 09/15/2013  . Hyperlipidemia   . Hypertension     Patient's Medications  New Prescriptions   CIPROFLOXACIN (CIPRO) 500 MG TABLET    Take 1 tablet (500 mg total) by mouth at bedtime.   METRONIDAZOLE (FLAGYL) 500 MG TABLET    Take 1 tablet (500 mg total) by mouth 2 (two) times daily.   VANCOMYCIN (VANCOCIN) 1-5 GM/200ML-% SOLN    Inject 200 mLs (1,000 mg total) into the vein daily.  Previous Medications   ALBUTEROL (PROVENTIL HFA;VENTOLIN HFA) 108 (90 BASE) MCG/ACT INHALER    Inhale 2 puffs into the lungs every 6 (six) hours as needed for wheezing or shortness of breath.    ALBUTEROL  (PROVENTIL) (2.5 MG/3ML) 0.083% NEBULIZER SOLUTION    Take 2.5 mg by nebulization every 6 (six) hours as needed for wheezing or shortness of breath.   ATORVASTATIN (LIPITOR) 40 MG TABLET    Take 40 mg by mouth at bedtime.   BUDESONIDE (PULMICORT) 0.25 MG/2ML NEBULIZER SOLUTION    Take 0.25 mg by nebulization 2 (two) times daily as needed (shortness of breath or wheezing).   CALCITRIOL (ROCALTROL) 0.25 MCG CAPSULE    Take 0.25 mcg by mouth daily.   CARVEDILOL (COREG) 25 MG TABLET    Take 25 mg by mouth 2 (two) times daily.   DULOXETINE (CYMBALTA) 30 MG CAPSULE    Take 1 capsule (30 mg total) by mouth daily. Qam   HYDROXYZINE (ATARAX/VISTARIL) 25 MG TABLET    Take 1 tablet (25 mg total) by mouth every 6 (six) hours.   INSULIN ASPART (NOVOLOG FLEXPEN) 100 UNIT/ML FLEXPEN    Inject 12-20 Units into the skin 3 (three) times daily with meals. Per sliding scale - based on carb count and CBG   INSULIN GLARGINE (LANTUS SOLOSTAR) 100 UNIT/ML SOLOSTAR PEN    Inject 10 Units into the skin at bedtime.   ISOSORBIDE DINITRATE (ISORDIL) 20 MG TABLET    Take 20 mg by mouth 3 (three) times daily.   NITROGLYCERIN (NITRODUR - DOSED IN MG/24 HR) 0.2 MG/HR PATCH    Place 1 patch (0.2 mg total) onto the skin daily.   OXYCODONE (OXY IR/ROXICODONE) 5 MG IMMEDIATE RELEASE TABLET  Take 1 tablet (5 mg total) by mouth every 4 (four) hours as needed for severe pain.   OXYCODONE (ROXICODONE) 5 MG IMMEDIATE RELEASE TABLET    Take 1 tablet (5 mg total) by mouth every 4 (four) hours as needed for severe pain.   OXYCODONE-ACETAMINOPHEN (PERCOCET/ROXICET) 5-325 MG TABLET    Take 1 tablet by mouth every 4 (four) hours as needed for severe pain.   PREGABALIN (LYRICA) 50 MG CAPSULE    Take 1 capsule (50 mg total) by mouth 3 (three) times daily.   SEVELAMER (RENAGEL) 800 MG TABLET    Take 800 mg by mouth daily.   SILVER SULFADIAZINE (SILVADENE) 1 % CREAM    Apply 1 application topically daily. Apply to affected area daily plus dry  dressing  Modified Medications   No medications on file  Discontinued Medications   COLLAGENASE (SANTYL) OINTMENT    Apply 1 application topically daily.   DOXYCYCLINE (VIBRA-TABS) 100 MG TABLET    Take 1 tablet (100 mg total) by mouth 2 (two) times daily.   METHOCARBAMOL (ROBAXIN) 500 MG TABLET    Take 1 tablet (500 mg total) by mouth every 8 (eight) hours as needed for muscle spasms.   OXYCODONE (OXY IR/ROXICODONE) 5 MG IMMEDIATE RELEASE TABLET    Take 1 tablet (5 mg total) by mouth every 6 (six) hours as needed for breakthrough pain.    Recommendations: 1. Start IV vancomycin and oral ciprofloxacin and metronidazole 2. Discontinue doxycycline 3. Follow-up in 2 weeks   Assessment: I suspect that the major problem affecting wound healing is arterial insufficiency but she also probably has a component of polymicrobial infection. I will stop doxycycline and start broad empiric antibody therapy with IV vancomycin, ciprofloxacin and metronidazole. She will follow-up with me in 2 weeks.   HPI: [name] is a 35 y.o. female with juvenile onset diabetes, end-stage renal disease on hemodialysis and atherosclerotic peripheral vascular disease. She developed right great toe osteomyelitis and required amputation of her toe along with partial first metatarsal and sesamoids in September. No operative cultures were obtained. She was treated with IV vancomycin and ceftazidime for about 10 days postoperatively. In October her wound dehisced and she has had difficulty with it ever since. She has been followed at the [facility] wound Center as well as by Dr. [name] and her vascular surgeon, Dr. Fabienne Bruns. She has been on doxycycline recently without obvious benefit. She is scheduled for lower extremity arteriogram with possible intervention in 2 days. She states that she has been up on her feet walking much more than normal. She is just moved into a new apartment that has required much more activity. Recently, she  has had more pain in her feet and she has noted increase in yellow-green drainage that has some foul odor.  Review of Systems: Review of Systems  Constitutional: Positive for malaise/fatigue. Negative for chills, diaphoresis, fever and weight loss.  HENT: Negative for sore throat.   Respiratory: Negative for cough, sputum production and shortness of breath.   Cardiovascular: Negative for chest pain.  Gastrointestinal: Negative for abdominal pain, diarrhea, nausea and vomiting.  Genitourinary: Negative for dysuria and frequency.  Musculoskeletal: Positive for joint pain. Negative for myalgias.  Skin: Negative for rash.  Neurological: Negative for dizziness and headaches.  Psychiatric/Behavioral: Negative for depression and substance abuse. The patient is not nervous/anxious.       Past Medical History:  Diagnosis Date  . Anemia   . Arthritis    "left hand" (  09/15/2013)  . Asthma   . Chronic bronchitis (HCC)    "just about q yr" (09/15/2013)  . Chronic kidney disease    "low kidney function" (09/15/2013)  . COPD (chronic obstructive pulmonary disease) (HCC)   . Coronary artery disease   . Hyperlipidemia   . Hypertension   . Migraine    "get them alot" (09/15/2013)  . Myocardial infarction 04/2015   NSTEMI  . Normal coronary arteries    by cardiac catheterization 09/20/13  . Pneumonia    "couple times; have it now" (09/15/2013)  . Shortness of breath    "just recently; related to the pneumonia" (09/15/2013)  . Type 1 diabetes mellitus (HCC)    type 2    Social History  Substance Use Topics  . Smoking status: Never Smoker  . Smokeless tobacco: Never Used  . Alcohol use No    Family History  Problem Relation Age of Onset  . Diabetes Mother   . Stroke Mother   . Hypertension Father   . Hyperlipidemia Father   . Cancer - Lung Father   . Diabetes Maternal Grandmother   . Cancer Paternal Grandmother    Allergies  Allergen Reactions  . Aspirin Anaphylaxis  .  Sulfur Hives  . Tramadol Hives and Other (See Comments)    Pt states she feels weird     OBJECTIVE: Vitals:   09/03/16 1344  BP: 127/81  Pulse: 91  Temp: 97.6 F (36.4 C)  TempSrc: Oral  Weight: 245 lb (111.1 kg)  Height: 5\' 8"  (1.727 m)   Body mass index is 37.25 kg/m.   Physical Exam  Constitutional: She is oriented to person, place, and time.  She is seated in a wheelchair. She is accompanied by her mother and daughter.  Cardiovascular: Regular rhythm.   No murmur heard. Pulmonary/Chest: Effort normal and breath sounds normal.  Abdominal: Soft. There is no tenderness.  Musculoskeletal:  Left upper arm fistula site appears normal.  She has a large open wound at the site of her right great toe amputation. There is little to no good granulation tissue. There is yellow-green drainage on her dressing. I do not see any exposed bone but it is palpable. She has ischemic changes of her left fourth and fifth toes. She does have palpable Dorsalis pedis pulses bilaterally.  Neurological: She is alert and oriented to person, place, and time.  Skin: No rash noted.  Psychiatric: Mood and affect normal.    Microbiology: No results found for this or any previous visit (from the past 240 hour(s)).  Cliffton Asters, MD [facility] for Infectious Disease [facility] [phone] pager   [phone] cell 09/03/2016, 3:04 PM

## 2016-09-05 ENCOUNTER — Ambulatory Visit (HOSPITAL_COMMUNITY)
Admission: RE | Admit: 2016-09-05 | Discharge: 2016-09-05 | Disposition: A | Discharge status: Home / Self Care | Payer: Medicaid Other | Source: Ambulatory Visit | Attending: Vascular Surgery | Admitting: Vascular Surgery

## 2016-09-05 ENCOUNTER — Encounter (HOSPITAL_COMMUNITY)
Admission: RE | Disposition: A | Discharge status: Home / Self Care | Payer: Self-pay | Source: Ambulatory Visit | Attending: Vascular Surgery

## 2016-09-05 ENCOUNTER — Ambulatory Visit: Payer: Medicaid Other | Admitting: Surgery

## 2016-09-05 DIAGNOSIS — Z79899 Other long term (current) drug therapy: Secondary | ICD-10-CM | POA: Diagnosis not present

## 2016-09-05 DIAGNOSIS — Z794 Long term (current) use of insulin: Secondary | ICD-10-CM | POA: Insufficient documentation

## 2016-09-05 DIAGNOSIS — I70261 Atherosclerosis of native arteries of extremities with gangrene, right leg: Secondary | ICD-10-CM | POA: Diagnosis not present

## 2016-09-05 DIAGNOSIS — I2510 Atherosclerotic heart disease of native coronary artery without angina pectoris: Secondary | ICD-10-CM | POA: Insufficient documentation

## 2016-09-05 DIAGNOSIS — I120 Hypertensive chronic kidney disease with stage 5 chronic kidney disease or end stage renal disease: Secondary | ICD-10-CM | POA: Diagnosis not present

## 2016-09-05 DIAGNOSIS — Z833 Family history of diabetes mellitus: Secondary | ICD-10-CM | POA: Diagnosis not present

## 2016-09-05 DIAGNOSIS — I252 Old myocardial infarction: Secondary | ICD-10-CM | POA: Insufficient documentation

## 2016-09-05 DIAGNOSIS — I70235 Atherosclerosis of native arteries of right leg with ulceration of other part of foot: Secondary | ICD-10-CM | POA: Diagnosis not present

## 2016-09-05 DIAGNOSIS — E1122 Type 2 diabetes mellitus with diabetic chronic kidney disease: Secondary | ICD-10-CM | POA: Insufficient documentation

## 2016-09-05 DIAGNOSIS — I739 Peripheral vascular disease, unspecified: Secondary | ICD-10-CM | POA: Diagnosis present

## 2016-09-05 DIAGNOSIS — Z8249 Family history of ischemic heart disease and other diseases of the circulatory system: Secondary | ICD-10-CM | POA: Insufficient documentation

## 2016-09-05 DIAGNOSIS — E785 Hyperlipidemia, unspecified: Secondary | ICD-10-CM | POA: Diagnosis not present

## 2016-09-05 DIAGNOSIS — N186 End stage renal disease: Secondary | ICD-10-CM | POA: Diagnosis not present

## 2016-09-05 DIAGNOSIS — J449 Chronic obstructive pulmonary disease, unspecified: Secondary | ICD-10-CM | POA: Insufficient documentation

## 2016-09-05 DIAGNOSIS — Z992 Dependence on renal dialysis: Secondary | ICD-10-CM | POA: Insufficient documentation

## 2016-09-05 DIAGNOSIS — Z89411 Acquired absence of right great toe: Secondary | ICD-10-CM | POA: Insufficient documentation

## 2016-09-05 DIAGNOSIS — I70202 Unspecified atherosclerosis of native arteries of extremities, left leg: Secondary | ICD-10-CM | POA: Insufficient documentation

## 2016-09-05 HISTORY — PX: PERIPHERAL VASCULAR CATHETERIZATION: SHX172C

## 2016-09-05 LAB — POCT I-STAT, CHEM 8
BUN: 45 mg/dL — ABNORMAL HIGH (ref 6–20)
CHLORIDE: 98 mmol/L — AB (ref 101–111)
CREATININE: 4.5 mg/dL — AB (ref 0.44–1.00)
Calcium, Ion: 1.04 mmol/L — ABNORMAL LOW (ref 1.15–1.40)
GLUCOSE: 45 mg/dL — AB (ref 65–99)
HEMATOCRIT: 38 % (ref 36.0–46.0)
HEMOGLOBIN: 12.9 g/dL (ref 12.0–15.0)
POTASSIUM: 5.6 mmol/L — AB (ref 3.5–5.1)
Sodium: 135 mmol/L (ref 135–145)
TCO2: 34 mmol/L (ref 0–100)

## 2016-09-05 LAB — POCT ACTIVATED CLOTTING TIME
ACTIVATED CLOTTING TIME: 164 s
ACTIVATED CLOTTING TIME: 378 s
ACTIVATED CLOTTING TIME: 213 s
Activated Clotting Time: 169 seconds

## 2016-09-05 LAB — GLUCOSE, CAPILLARY
GLUCOSE-CAPILLARY: 44 mg/dL — AB (ref 65–99)
GLUCOSE-CAPILLARY: 90 mg/dL (ref 65–99)
GLUCOSE-CAPILLARY: 74 mg/dL (ref 65–99)
GLUCOSE-CAPILLARY: 72 mg/dL (ref 65–99)
GLUCOSE-CAPILLARY: 72 mg/dL (ref 65–99)

## 2016-09-05 SURGERY — ABDOMINAL AORTOGRAM
Anesthesia: LOCAL | Laterality: Right

## 2016-09-05 MED ORDER — OXYCODONE HCL 5 MG PO TABS: 5.0000 mg | ORAL_TABLET | ORAL | Status: DC | PRN

## 2016-09-05 MED ORDER — ACETAMINOPHEN 325 MG RE SUPP: 325.0000 mg | RECTAL | Status: DC | PRN

## 2016-09-05 MED ORDER — HEPARIN SODIUM (PORCINE) 1000 UNIT/ML IJ SOLN
INTRAMUSCULAR | Status: DC | PRN
  Administered 2016-09-05: 7000 [IU] via INTRAVENOUS
  Administered 2016-09-05: 10000 [IU] via INTRAVENOUS

## 2016-09-05 MED ORDER — LIDOCAINE HCL (PF) 1 % IJ SOLN
INTRAMUSCULAR | Status: AC
  Filled 2016-09-05: qty 30

## 2016-09-05 MED ORDER — ACETAMINOPHEN 325 MG PO TABS
325.0000 mg | ORAL_TABLET | ORAL | Status: DC | PRN
  Administered 2016-09-05: 650 mg via ORAL

## 2016-09-05 MED ORDER — METOPROLOL TARTRATE 5 MG/5ML IV SOLN: 2.0000 mg | INTRAVENOUS | Status: DC | PRN

## 2016-09-05 MED ORDER — IODIXANOL 320 MG/ML IV SOLN
INTRAVENOUS | Status: DC | PRN
  Administered 2016-09-05: 155 mL via INTRAVENOUS

## 2016-09-05 MED ORDER — CLOPIDOGREL BISULFATE 75 MG PO TABS: 75.0000 mg | ORAL_TABLET | Freq: Every day | ORAL | 11 refills | Status: DC

## 2016-09-05 MED ORDER — LIDOCAINE HCL (PF) 1 % IJ SOLN
INTRAMUSCULAR | Status: DC | PRN
  Administered 2016-09-05: 15 mL

## 2016-09-05 MED ORDER — CLOPIDOGREL BISULFATE 75 MG PO TABS: 75.0000 mg | ORAL_TABLET | Freq: Every day | ORAL | Status: DC

## 2016-09-05 MED ORDER — DEXTROSE 50 % IV SOLN
INTRAVENOUS | Status: AC
  Administered 2016-09-05: 25 mL
  Filled 2016-09-05: qty 50

## 2016-09-05 MED ORDER — ACETAMINOPHEN 325 MG PO TABS
ORAL_TABLET | ORAL | Status: AC
  Filled 2016-09-05: qty 2

## 2016-09-05 MED ORDER — DEXTROSE 50 % IV SOLN: 25.0000 mL | Freq: Once | INTRAVENOUS | Status: DC

## 2016-09-05 MED ORDER — HEPARIN SODIUM (PORCINE) 1000 UNIT/ML IJ SOLN
INTRAMUSCULAR | Status: AC
  Filled 2016-09-05: qty 1

## 2016-09-05 MED ORDER — ONDANSETRON HCL 4 MG/2ML IJ SOLN: 4.0000 mg | Freq: Four times a day (QID) | INTRAMUSCULAR | Status: DC | PRN

## 2016-09-05 MED ORDER — CLOPIDOGREL BISULFATE 300 MG PO TABS
ORAL_TABLET | ORAL | Status: AC
  Filled 2016-09-05: qty 1

## 2016-09-05 MED ORDER — HEPARIN (PORCINE) IN NACL 2-0.9 UNIT/ML-% IJ SOLN
INTRAMUSCULAR | Status: DC | PRN
  Administered 2016-09-05: 1000 mL

## 2016-09-05 MED ORDER — SODIUM CHLORIDE 0.9% FLUSH: 3.0000 mL | INTRAVENOUS | Status: DC | PRN

## 2016-09-05 MED ORDER — HEPARIN (PORCINE) IN NACL 2-0.9 UNIT/ML-% IJ SOLN
INTRAMUSCULAR | Status: AC
  Filled 2016-09-05: qty 1000

## 2016-09-05 MED ORDER — HYDRALAZINE HCL 20 MG/ML IJ SOLN: 5.0000 mg | INTRAMUSCULAR | Status: DC | PRN

## 2016-09-05 MED ORDER — LABETALOL HCL 5 MG/ML IV SOLN: 10.0000 mg | INTRAVENOUS | Status: DC | PRN

## 2016-09-05 MED ORDER — MORPHINE SULFATE (PF) 10 MG/ML IV SOLN: 2.0000 mg | INTRAVENOUS | Status: DC | PRN

## 2016-09-05 MED ORDER — GUAIFENESIN-DM 100-10 MG/5ML PO SYRP: 15.0000 mL | ORAL_SOLUTION | ORAL | Status: DC | PRN

## 2016-09-05 MED ORDER — NYSTATIN 100000 UNIT/GM EX POWD: Freq: Four times a day (QID) | CUTANEOUS | 0 refills | Status: DC

## 2016-09-05 MED ORDER — CLOPIDOGREL BISULFATE 300 MG PO TABS
300.0000 mg | ORAL_TABLET | Freq: Once | ORAL | Status: AC
  Administered 2016-09-05: 300 mg via ORAL

## 2016-09-05 SURGICAL SUPPLY — 16 items
BALLN COYOTE OTW 3X100X150 (BALLOONS) ×4
BALLOON COYOTE OTW 3X100X150 (BALLOONS) ×3 IMPLANT
CATH CXI SUPP ANG 2.6FR 150CM (MICROCATHETER) ×4 IMPLANT
CATH OMNI FLUSH 5F 65CM (CATHETERS) ×8 IMPLANT
CATH QUICKCROSS .018X135CM (MICROCATHETER) ×4 IMPLANT
COVER PRB 48X5XTLSCP FOLD TPE (BAG) ×3 IMPLANT
COVER PROBE 5X48 (BAG) ×1
KIT ENCORE 26 ADVANTAGE (KITS) ×4 IMPLANT
KIT PV (KITS) ×4 IMPLANT
SHEATH PINNACLE 5F 10CM (SHEATH) ×4 IMPLANT
SHEATH PINNACLE MP 7F 45CM (SHEATH) ×4 IMPLANT
SYR MEDRAD MARK V 150ML (SYRINGE) ×4 IMPLANT
TRANSDUCER W/STOPCOCK (MISCELLANEOUS) ×4 IMPLANT
TRAY PV CATH (CUSTOM PROCEDURE TRAY) ×4 IMPLANT
WIRE HITORQ VERSACORE ST 145CM (WIRE) ×4 IMPLANT
WIRE SPARTACORE .014X300CM (WIRE) ×4 IMPLANT

## 2016-09-05 NOTE — Progress Notes (Signed)
Site area: Left groin a 7 french arterial sheath was removed  Site Prior to Removal:  Level 0  Pressure Applied For 20 MINUTES    Bedrest Beginning at 1310p  Manual:   Yes.    Patient Status During Pull:  stable  Post Pull Groin Site:  Level 0  Post Pull Instructions Given:  Yes.    Post Pull Pulses Present:  Yes.    Dressing Applied:  Yes.    Comments:  VS remain stable during sheath pull

## 2016-09-05 NOTE — Interval H&P Note (Signed)
History and Physical Interval Note:  09/05/2016 7:47 AM  [name]  has presented today for surgery, with the diagnosis of pvd  The various methods of treatment have been discussed with the patient and family. After consideration of risks, benefits and other options for treatment, the patient has consented to  Procedure(s): Abdominal Aortogram (N/A) as a surgical intervention .  The patient's history has been reviewed, patient examined, no change in status, stable for surgery.  I have reviewed the patient's chart and labs.  Questions were answered to the patient's satisfaction.     Fabienne Bruns

## 2016-09-05 NOTE — Op Note (Addendum)
Procedure: Abdominal aortogram with bilateral lower extremity runoff, angioplasty right anterior tibial artery, angioplasty right peroneal artery (3 x 100)  Preoperative diagnosis: Nonhealing wound right foot new proposed operative diagnosis: Same  Anesthesia: Local  Operative findings: #1 subtotal occlusion origin right anterior tibial artery #2 subtotal occlusion proximal right peroneal artery #3 bilateral posterior tibial artery occlusion #4 subtotal occlusion over lengthy segment left peroneal artery #5 patent left anterior tibial artery  Operative details: After obtaining informed consent, the patient was taken to the LAD. The patient was placed in supine position the Angio table. Both groins were prepped and draped in usual sterile fashion. Local anesthesia was infiltrated over the left common femoral artery. Ultrasound was used to identify the left common femoral artery and femoral bifurcation. An introducer needle was then used to cannulate the left common femoral artery without difficulty. An 035 versacore wires and threaded up in the abdominal aorta under fluoroscopic guidance. Next a 5 French sheath was placed over the guidewire and the left common femoral artery. This was thoroughly flushed with heparin saline. A 5 French Omni Flush catheter was advanced up into the abdominal aorta. Abdominal aortogram was obtained in AP projection. The left and right renal arteries are patent at their origin. The infrarenal abdominal aorta is widely patent. The left and right common external and internal iliac arteries are all widely patent.  At this point the Omni Flush catheter was pulled down to selectively catheterize the right common iliac and the guidewire then advanced down into the distal right external iliac and the catheter advanced over this and right lower extremity runoff views were obtained.  In the right lower extremity, the right common femoral profunda femoris and superficial femoral  arteries are widely patent. The popliteal artery is widely patent. Air is subtotal occlusion at the origin of the right anterior tibial artery. The vessel distally otherwise is fairly healthy in appearance and patent. There is some small vessel disease in the foot distally. The posterior tibial artery is occluded. The peroneal artery has a diffuse segment of subtotal occlusion extending over 3-4 cm at it's proximal portion. The anterior posterior communicating branches are patent but small.  At this point it was decided to intervene on the areas of narrowing in the anterior tibial and peroneal arteries on the right leg. The patient was given 10,000 units of intravenous heparin. She was then given an additional 7000 units of heparin when the initial ACT was less than 200. Final ACT was 378. The versacore wire was advanced through the Omni Flush catheter down into the right superficial femoral artery. The 5 French sheath was removed over the guidewire. A 7 French Terumo sheath was then advanced over the guidewire into the proximal right superficial femoral artery. An 014 quick cross and 014 Spartacore wire was then brought up and advanced as a unit down into the right superficial femoral artery and then down to the level of the peroneal artery. Using a quick cross catheter for support I was able to easily advance the Spartacore wire across the peroneal stenosis and down into the distal peroneal artery. A 3 x 100 angioplasty balloon was then advanced and centered over the proximal stenosis in the peroneal artery and inflated to 10 atm for 1 minute. There was still some residual stenosis at the proximal aspect of the artery. So the balloon was again inflated to 10 atm for 1 minute. Completion arteriogram showed wide patency of the peroneal artery 0 residual stenosis. There was no  evidence of dissection or distal embolization. At this point a guidewire was pulled back into the popliteal artery. A CXI  catheter was then  shaped on its tip to have a slight curve and this was used to direct the Spartacore wire towards the origin of the anterior tibial artery. With some manipulation I was able to get the wire to cross the origin of the anterior tibial artery and then advanced this easily down in the distal anterior tibial artery. The same 3 x 100 Angio plasty balloon was then advanced over the wire into the anterior tibial artery. It was then inflated to 10 atm for 1 minute. Completion arteriogram again showed no evidence of distal dissection 0 residual stenosis and no evidence of distal embolization.  At this point the guidewire and catheter were removed. The sheath was then pulled back into the left hemipelvis. A left lower extremity runoff view was then obtained through the sheath. The left common femoral superficial femoral profunda femoris and popliteal arteries are all widely patent. The left posterior tibial artery is occluded. The anterior tibial artery is patent although it at the foot. The peroneal artery is patent at its proximal 2-3 cm. Then there is a long segment occlusion over about 7 cm. The anterior posterior communicating branches then filled distally via collaterals. At this point the procedure was concluded. The patient was taken to the holding area with the sheath in place to be pulled after the ACT is less than 175. The patient tolerated the procedure well and there were no complications.  Ruta Hinds, MD [facility] Office: [phone] Pager: [phone]

## 2016-09-05 NOTE — Discharge Instructions (Signed)
Pick up plavix 75 mg at pharmacy and take next dose 09/06/16  Angiogram, Care After Refer to this sheet in the next few weeks. These instructions provide you with information about caring for yourself after your procedure. Your health care provider may also give you more specific instructions. Your treatment has been planned according to current medical practices, but problems sometimes occur. Call your health care provider if you have any problems or questions after your procedure. What can I expect after the procedure? After your procedure, it is typical to have the following:  Bruising at the catheter insertion site that usually fades within 1-2 weeks.  Blood collecting in the tissue (hematoma) that may be painful to the touch. It should usually decrease in size and tenderness within 1-2 weeks. Follow these instructions at home:  Take medicines only as directed by your health care provider.  You may shower 24-48 hours after the procedure or as directed by your health care provider. Remove the bandage (dressing) and gently wash the site with plain soap and water. Pat the area dry with a clean towel. Do not rub the site, because this may cause bleeding.  Do not take baths, swim, or use a hot tub until your health care provider approves.  Check your insertion site every day for redness, swelling, or drainage.  Do not apply powder or lotion to the site.  Do not lift over 10 lb (4.5 kg) for 5 days after your procedure or as directed by your health care provider.  Ask your health care provider when it is okay to:  Return to work or school.  Resume usual physical activities or sports.  Resume sexual activity.  Do not drive home if you are discharged the same day as the procedure. Have someone else drive you.  You may drive 24 hours after the procedure unless otherwise instructed by your health care provider.  Do not operate machinery or power tools for 24 hours after the procedure or as  directed by your health care provider.  If your procedure was done as an outpatient procedure, which means that you went home the same day as your procedure, a responsible adult should be with you for the first 24 hours after you arrive home.  Keep all follow-up visits as directed by your health care provider. This is important. Contact a health care provider if:  You have a fever.  You have chills.  You have increased bleeding from the catheter insertion site. Hold pressure on the site. CALL 911 Get help right away if:  You have unusual pain at the catheter insertion site.  You have redness, warmth, or swelling at the catheter insertion site.  You have drainage (other than a small amount of blood on the dressing) from the catheter insertion site.  The catheter insertion site is bleeding, and the bleeding does not stop after 30 minutes of holding steady pressure on the site.  The area near or just beyond the catheter insertion site becomes pale, cool, tingly, or numb. This information is not intended to replace advice given to you by your health care provider. Make sure you discuss any questions you have with your health care provider. Document Released: 04/03/2005 Document Revised: 02/21/2016 Document Reviewed: 02/16/2013 Elsevier Interactive Patient Education  2017 [employer].

## 2016-09-05 NOTE — H&P (View-Only) (Signed)
History of Present Illness:  Patient is a 35 y.o. year old female who presents for evaluation of bilateral ischemic foot wounds.  Dr. 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  The amputation site has not healed.  The patient currently describes a painful sensation in the bilateral lower extremity with rest and with weight bearing activity. Her primary mode of mobility is a WC, prior to her toe amputation in September she was ambulatory.   Atherosclerotic risk factors and other medical problems include Juvenile DM, ESRD on HD, NSTEMI, HTN.  She is currently medically managed with a beta blocker and Statin daily.    Past Medical History:  Diagnosis Date  . Anemia   . Arthritis    "left hand" (09/15/2013)  . Asthma   . Chronic bronchitis (HCC)    "just about q yr" (09/15/2013)  . Chronic kidney disease    "low kidney function" (09/15/2013)  . COPD (chronic obstructive pulmonary disease) (HCC)   . Coronary artery disease   . Hyperlipidemia   . Hypertension   . Migraine    "get them alot" (09/15/2013)  . Myocardial infarction 04/2015   NSTEMI  . Normal coronary arteries    by cardiac catheterization 09/20/13  . Pneumonia    "couple times; have it now" (09/15/2013)  . Shortness of breath    "just recently; related to the pneumonia" (09/15/2013)  . Type 1 diabetes mellitus (HCC)    type 2    Past Surgical History:  Procedure Laterality Date  . AMPUTATION Right 06/25/2016   Procedure: Amputation Right Great Toe at the Metatarsophalangeal Joint;  Surgeon: Nadara Mustard, MD;  Location: [facility] OR;  Service: Orthopedics;  Laterality: Right;  . AV FISTULA PLACEMENT Left 03/27/2015   Procedure: CREATION RADIAL CEPHALIC ARTERIOVENOUS FISTULA;  Surgeon: Chuck Hint, MD;  Location: [facility] OR;  Service: Vascular;  Laterality: Left;  . AV FISTULA PLACEMENT Left 11/23/2015   Procedure:  LEFT ARM BASILIC VEIN TRANSPOSITION  ;   Surgeon: Chuck Hint, MD;  Location: [facility] OR;  Service: Vascular;  Laterality: Left;  . CESAREAN SECTION  1999; 2006  . CORONARY ANGIOGRAM  09/20/2013   Procedure: CORONARY ANGIOGRAM;  Surgeon: Runell Gess, MD;  Location[facility] CATH LAB;  Service: Cardiovascular;;  . FINGER SURGERY Left 1985   3rd and 4th digits reconstructed after cut off" (09/15/2013)  . SHOULDER ARTHROSCOPY WITH BICEPSTENOTOMY Right 05/10/2015   Procedure: RIGHT SHOULDER ARTHROSCOPY WITH BICEPS TENOTOMY, DEBRIDEMENT LABRAL TEAR;  Surgeon: Jones Broom, MD;  Location: [facility] OR;  Service: Orthopedics;  Laterality: Right;  Right shoulder arthroscopy biceps tenotomy, debridement labral tear  . TONSILLECTOMY  1997  . TUBAL LIGATION  2006    ROS:   General:  No weight loss, Fever, chills  HEENT: No recent headaches, no nasal bleeding, no visual changes, no sore throat  Neurologic: No dizziness, blackouts, seizures. No recent symptoms of stroke or mini- stroke. No recent episodes of slurred speech, or temporary blindness.  Cardiac: No recent episodes of chest pain/pressure, no shortness of breath at rest.  No shortness of breath with exertion.  Denies history of atrial fibrillation or irregular heartbeat  Vascular: No history of rest pain in feet.  No history of claudication.  No history of non-healing ulcer, No history of DVT   Pulmonary: No home oxygen, no productive cough, no hemoptysis,  No asthma or wheezing  Musculoskeletal:  [ ]  Arthritis, [ ]  Low  back pain,  [ ]  Joint pain  Hematologic:No history of hypercoagulable state.  No history of easy bleeding.  No history of anemia  Gastrointestinal: No hematochezia or melena,  No gastroesophageal reflux, no trouble swallowing  Urinary: [ ]  chronic Kidney disease, [ ]  on HD - [ ]  MWF or [ ]  TTHS, [ ]  Burning with urination, [ ]  Frequent urination, [ ]  Difficulty urinating;   Skin: No rashes  Psychological: No history of anxiety,  No history of  depression  Social History Social History  Substance Use Topics  . Smoking status: Never Smoker  . Smokeless tobacco: Never Used  . Alcohol use No    Family History Family History  Problem Relation Age of Onset  . Diabetes Mother   . Stroke Mother   . Hypertension Father   . Hyperlipidemia Father   . Cancer - Lung Father   . Diabetes Maternal Grandmother   . Cancer Paternal Grandmother     Allergies  Allergies  Allergen Reactions  . Aspirin Anaphylaxis  . Sulfur Hives  . Tramadol Hives and Other (See Comments)    Pt states she feels weird      Current Outpatient Prescriptions  Medication Sig Dispense Refill  . albuterol (PROVENTIL HFA;VENTOLIN HFA) 108 (90 BASE) MCG/ACT inhaler Inhale 2 puffs into the lungs every 6 (six) hours as needed for wheezing or shortness of breath.     Marland Kitchen albuterol (PROVENTIL) (2.5 MG/3ML) 0.083% nebulizer solution Take 2.5 mg by nebulization every 6 (six) hours as needed for wheezing or shortness of breath.    Marland Kitchen atorvastatin (LIPITOR) 40 MG tablet Take 40 mg by mouth at bedtime.    . budesonide (PULMICORT) 0.25 MG/2ML nebulizer solution Take 0.25 mg by nebulization 2 (two) times daily as needed (shortness of breath or wheezing).    . calcitRIOL (ROCALTROL) 0.25 MCG capsule Take 0.25 mcg by mouth daily.    . carvedilol (COREG) 25 MG tablet Take 25 mg by mouth 2 (two) times daily.    . collagenase (SANTYL) ointment Apply 1 application topically daily. 15 g 0  . doxycycline (VIBRA-TABS) 100 MG tablet Take 1 tablet (100 mg total) by mouth 2 (two) times daily. 60 tablet 0  . DULoxetine (CYMBALTA) 30 MG capsule Take 1 capsule (30 mg total) by mouth daily. Qam 30 capsule 3  . hydrOXYzine (ATARAX/VISTARIL) 25 MG tablet Take 1 tablet (25 mg total) by mouth every 6 (six) hours. 24 tablet 0  . insulin aspart (NOVOLOG FLEXPEN) 100 UNIT/ML FlexPen Inject 12-20 Units into the skin 3 (three) times daily with meals. Per sliding scale - based on carb count and  CBG    . Insulin Glargine (LANTUS SOLOSTAR) 100 UNIT/ML Solostar Pen Inject 10 Units into the skin at bedtime. 15 mL 11  . isosorbide dinitrate (ISORDIL) 20 MG tablet Take 20 mg by mouth 3 (three) times daily.    . methocarbamol (ROBAXIN) 500 MG tablet Take 1 tablet (500 mg total) by mouth every 8 (eight) hours as needed for muscle spasms. 30 tablet 0  . nitroGLYCERIN (NITRODUR - DOSED IN MG/24 HR) 0.2 mg/hr patch Place 1 patch (0.2 mg total) onto the skin daily. 30 patch 3  . oxyCODONE (OXY IR/ROXICODONE) 5 MG immediate release tablet Take 1 tablet (5 mg total) by mouth every 6 (six) hours as needed for breakthrough pain. 20 tablet 0  . oxyCODONE (OXY IR/ROXICODONE) 5 MG immediate release tablet Take 1 tablet (5 mg total) by mouth every 4 (four) hours  as needed for severe pain. 60 tablet 0  . oxyCODONE (ROXICODONE) 5 MG immediate release tablet Take 1 tablet (5 mg total) by mouth every 4 (four) hours as needed for severe pain. 20 tablet 0  . oxyCODONE-acetaminophen (PERCOCET/ROXICET) 5-325 MG tablet Take 1 tablet by mouth every 4 (four) hours as needed for severe pain. 60 tablet 0  . pregabalin (LYRICA) 50 MG capsule Take 1 capsule (50 mg total) by mouth 3 (three) times daily. 90 capsule 1  . sevelamer (RENAGEL) 800 MG tablet Take 800 mg by mouth daily.    . silver sulfADIAZINE (SILVADENE) 1 % cream Apply 1 application topically daily. Apply to affected area daily plus dry dressing 400 g 3   No current facility-administered medications for this visit.     Physical Examination  Vitals:   08/28/16 1253  BP: 118/77  Pulse: 70  Resp: 18  Temp: 97.3 F (36.3 C)  TempSrc: Oral  SpO2: 95%  Weight: 247 lb (112 kg)  Height: 5\' 8"  (1.727 m)    Body mass index is 37.56 kg/m.  General:  Alert and oriented, no acute distress HEENT: Normal Neck: No bruit or JVD Pulmonary: Clear to auscultation bilaterally Cardiac: Regular Rate and Rhythm without murmur Abdomen: Soft, non-tender,  non-distended, no mass, no scars Skin: No rash, healed abdominal wound, left foot 4/5 digits with ischemic changes Gangrenous tips of toes and right 1 st ray amputation site non healing wound wound is 4 x 5 cm with fibrinous exudate at the base  Extremity Pulses:  2+ radial, non palpable femoral, dorsalis pedis, posterior tibial  bilaterally Musculoskeletal: No deformity or edema  Neurologic: Upper and lower extremity motor 5/5 and symmetric  DATA:  08/18/2016 Arterial duplex Right Common femoral to SFA Biphasic to monophasic demonstrates SFA stenosis Left Proximal SFA biphasic to monophasic als demonstrates SFA stenosis.   06/21/2016 Arterial pressure indices:  +-----------------+---------+--------------+----------+ Location         Pressure Brachial indexWaveform   +-----------------+---------+--------------+----------+ Right ant tibial 255 mm Hg1.62          Monophasic +-----------------+---------+--------------+----------+ Right post tibial255 mm Hg1.62          Biphasic   +-----------------+---------+--------------+----------+ Left ant tibial  255 mm Hg1.62          Triphasic  +-----------------+---------+--------------+----------+ Left post tibial 255 mm Hg1.62          Triphasic  +-----------------+---------+--------------+----------+  ------------------------------------------------------------------- Summary: Bilateral ABI non-compressible. Right pedal artery waveforms appear abnormal.  ASSESSMENT:  PAD with bilateral feet ischemic changes   PLAN:  She does not have palpable femoral or pedal pulses.  Her arterial duplex demonstrates states SFA stenosis.  She has ESRD and is on HD.  She reports no contrast dye allergy.  We will plan angiogram with bilateral run off and possible intervention Dec. 8 th 2017 by Dr. Darrick PennaFields.   Thomasena EdisOLLINS, EMMA MAUREEN PA-C [facility]  The patient was seen in conjunction with Dr.  Darrick PennaFields  History and exam findings as above. 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 Arteriogram intervention scheduled for 09/06/2015. Patient did not have time to have the arteriogram performed tomorrow. Risks benefits possible complications and procedure details including but not limited to bleeding infection vessel injury were explained the patient today. She understands and agrees to proceed.  Fabienne Brunsharles Fields, MD [facility] Office: [phone] Pager: [phone]

## 2016-09-08 ENCOUNTER — Other Ambulatory Visit: Payer: Self-pay | Admitting: *Deleted

## 2016-09-08 ENCOUNTER — Ambulatory Visit (INDEPENDENT_AMBULATORY_CARE_PROVIDER_SITE_OTHER): Payer: Medicaid Other | Admitting: Orthopedic Surgery

## 2016-09-08 ENCOUNTER — Encounter (HOSPITAL_COMMUNITY): Payer: Self-pay | Admitting: Vascular Surgery

## 2016-09-08 VITALS — Ht 68.0 in | Wt 252.0 lb

## 2016-09-08 DIAGNOSIS — I70263 Atherosclerosis of native arteries of extremities with gangrene, bilateral legs: Secondary | ICD-10-CM

## 2016-09-08 DIAGNOSIS — I739 Peripheral vascular disease, unspecified: Secondary | ICD-10-CM

## 2016-09-08 DIAGNOSIS — Z9862 Peripheral vascular angioplasty status: Secondary | ICD-10-CM

## 2016-09-08 NOTE — Progress Notes (Signed)
Office Visit Note   Patient: [name]           Date of Birth: 1981-04-01           MRN: [id number] Visit Date: 09/08/2016              Requested by: [name], FNP [address] [place], [place] [address] PCP: [name], FNP   Assessment & Plan: Visit Diagnoses:  1. 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 Patient will continue Silvadene dressing changes to the right great toe amputation there is improved granulation tissue at the base and we will set up as compared to come out with wound care. Left foot and ischemic ulcers are improving she has new venous stasis ulcers left leg with the weeping edema and she is given a prescription to go to go for medical supply to wear a 15-20 mm compression stocking around-the-clock on the left leg change daily  Follow-Up Instructions: Return in about 3 weeks (around 09/29/2016).   Orders:  No orders of the defined types were placed in this encounter.  No orders of the defined types were placed in this encounter.     Procedures: No procedures performed   Clinical Data: No additional findings.   Subjective: Chief Complaint  Patient presents with  . Left Foot - Follow-up    06/25/16 right great toe amputation     09/05/16 s/p an abdominal aortogram with Dr. Darrick Penna and had an appt with infectious disease last week.  She states that she is on two oral meds and 1 at dialysis. Amputation has dehis and she is using a dry dressing. She is non weight bearing with a post op shoe. Nonviable tissue without odor and small amount of bloody drainage.    Status post balloon angioplasty with vascular vein surgery  Of note patient states that she is undergoing a sleep study her O2 sats dropped to 70% at night.  Review of Systems   Objective: Vital Signs: Ht 5\' 8"  (1.727 m)   Wt 252  lb (114.3 kg)   LMP 08/06/2016   BMI 38.32 kg/m   Physical Exam examination patient relates a wheelchair. Examination both feet are warm but I cannot palpate a pulse she has ischemic changes to the great toe fourth toe and fifth toe and heel on the left with weeping venous stasis edema from the massive venous stasis swelling of the left leg. Right foot great toe amputation shows the wound to by 3 cm and 5 mm deep which has improved granulation tissue. There is no ascending cellulitis in either lower extremity no purulence no drainage no odor.  Ortho Exam  Specialty Comments:  No specialty comments available.  Imaging: No results found.   PMFS History: Patient Active Problem List   Diagnosis Date Noted  . 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 Great toe amputation status, right (HCC) 08/01/2016  . Diabetic osteomyelitis (HCC)   . Gangrene (HCC)   . 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 Diabetic foot infection (HCC) 06/20/2016  . ESRD (end stage renal disease) on dialysis (HCC) 06/20/2016  . Diabetic foot ulcer (HCC) 06/20/2016  . Pre-operative clearance 10/12/2015  . Normal coronary arteries 10/12/2015  . NSTEMI- type 2, Troponin 11.2 05/11/2015  . Respiratory failure requiring intubation (  HCC) 05/10/2015  . History of arthroscopy of right shoulder-05/10/15 05/10/2015  . CKD (chronic kidney disease) stage 5, GFR less than 15 ml/min (HCC) 09/21/2014  . Unspecified asthma(493.90) 10/25/2013  . Acute combined systolic and diastolic heart failure (HCC) 09/21/2013  . Non-ischemic cardiomyopathy - by echo 8/16- EF 35-40% 09/16/2013  . Morbid obesity-  09/16/2013  . 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) 09/15/2013  . Normocytic anemia 09/15/2013  . Lower extremity edema 09/15/2013  . Hyperlipidemia   . Hypertension    Past Medical History:  Diagnosis Date  . Anemia   . Arthritis    "left  hand" (09/15/2013)  . Asthma   . Chronic bronchitis (HCC)    "just about q yr" (09/15/2013)  . Chronic kidney disease    "low kidney function" (09/15/2013)  . COPD (chronic obstructive pulmonary disease) (HCC)   . Coronary artery disease   . Hyperlipidemia   . Hypertension   . Migraine    "get them alot" (09/15/2013)  . Myocardial infarction 04/2015   NSTEMI  . Normal coronary arteries    by cardiac catheterization 09/20/13  . Pneumonia    "couple times; have it now" (09/15/2013)  . Shortness of breath    "just recently; related to the pneumonia" (09/15/2013)  . Type 1 diabetes mellitus (HCC)    type 2    Family History  Problem Relation Age of Onset  . Diabetes Mother   . Stroke Mother   . Hypertension Father   . Hyperlipidemia Father   . Cancer - Lung Father   . Diabetes Maternal Grandmother   . Cancer Paternal Grandmother     Past Surgical History:  Procedure Laterality Date  . AMPUTATION Right 06/25/2016   Procedure: Amputation Right Great Toe at the Metatarsophalangeal Joint;  Surgeon: Nadara MustardMarcus V Duda, MD;  [facility];  Service: Orthopedics;  Laterality: Right;  . AV FISTULA PLACEMENT Left 03/27/2015   Procedure: CREATION RADIAL CEPHALIC ARTERIOVENOUS FISTULA;  Surgeon: Chuck Hinthristopher S Dickson, MD;  [facility];  Service: Vascular;  Laterality: Left;  . AV FISTULA PLACEMENT Left 11/23/2015   Procedure:  LEFT ARM BASILIC VEIN TRANSPOSITION  ;  Surgeon: Chuck Hinthristopher S Dickson, MD;  [facility];  Service: Vascular;  Laterality: Left;  . CESAREAN SECTION  1999; 2006  . CORONARY ANGIOGRAM  09/20/2013   Procedure: CORONARY ANGIOGRAM;  Surgeon: Runell GessJonathan J Berry, MD;  [facility];  Service: Cardiovascular;;  . FINGER SURGERY Left 1985   3rd and 4th digits reconstructed after cut off" (09/15/2013)  . PERIPHERAL VASCULAR CATHETERIZATION N/A 09/05/2016   Procedure: Abdominal Aortogram;  Surgeon: Sherren Kernsharles E Fields, MD;  [facility];  Service:  Cardiovascular;  Laterality: N/A;  . PERIPHERAL VASCULAR CATHETERIZATION Bilateral 09/05/2016   Procedure: Lower Extremity Angiography;  Surgeon: Sherren Kernsharles E Fields, MD;  [facility];  Service: Cardiovascular;  Laterality: Bilateral;  . PERIPHERAL VASCULAR CATHETERIZATION Right 09/05/2016   Procedure: Peripheral Vascular Balloon Angioplasty;  Surgeon: Sherren Kernsharles E Fields, MD;  [facility];  Service: Cardiovascular;  Laterality: Right;  peroneal and AT  . SHOULDER ARTHROSCOPY WITH BICEPSTENOTOMY Right 05/10/2015   Procedure: RIGHT SHOULDER ARTHROSCOPY WITH BICEPS TENOTOMY, DEBRIDEMENT LABRAL TEAR;  Surgeon: Jones BroomJustin Chandler, MD;  Location: [facility] OR;  Service: Orthopedics;  Laterality: Right;  Right shoulder arthroscopy biceps tenotomy, debridement labral tear  . TONSILLECTOMY  1997  . TUBAL LIGATION  2006   Social History   Occupational History  .  Student    Social History Main Topics  . Smoking status: Never Smoker  . Smokeless tobacco: Never Used  . Alcohol use No  . Drug use: No  . Sexual activity: Not Currently    Partners: Male    Birth control/ protection: Other-see comments     Comment: S/P tubal ligation

## 2016-09-09 ENCOUNTER — Telehealth (INDEPENDENT_AMBULATORY_CARE_PROVIDER_SITE_OTHER): Payer: Self-pay

## 2016-09-09 NOTE — Telephone Encounter (Signed)
I called pt to advise that the silvadene dressing that she is wanting a home health nurse to come out and do is a teachable skill and not something that nursing will go out and do three times a week. She is apply the dressing now and she should continue with that. Pt also asked about a medication refill for "pain meds" the pt last had an rx for percocet filled on 08/25/16 for 60 and lyrica filled on 09/04/16 for 90 pt to continue with this and can discuss with Dr. [name] at next office visit.

## 2016-09-10 ENCOUNTER — Encounter (HOSPITAL_COMMUNITY): Payer: Self-pay | Admitting: *Deleted

## 2016-09-10 ENCOUNTER — Emergency Department (HOSPITAL_COMMUNITY)
Admission: EM | Admit: 2016-09-10 | Discharge: 2016-09-11 | Disposition: A | Discharge status: Home / Self Care | Payer: Medicaid Other | Attending: Emergency Medicine | Admitting: Emergency Medicine

## 2016-09-10 DIAGNOSIS — E1022 Type 1 diabetes mellitus with diabetic chronic kidney disease: Secondary | ICD-10-CM | POA: Diagnosis not present

## 2016-09-10 DIAGNOSIS — Y939 Activity, unspecified: Secondary | ICD-10-CM | POA: Insufficient documentation

## 2016-09-10 DIAGNOSIS — S301XXA Contusion of abdominal wall, initial encounter: Secondary | ICD-10-CM

## 2016-09-10 DIAGNOSIS — X58XXXA Exposure to other specified factors, initial encounter: Secondary | ICD-10-CM | POA: Insufficient documentation

## 2016-09-10 DIAGNOSIS — Y999 Unspecified external cause status: Secondary | ICD-10-CM | POA: Insufficient documentation

## 2016-09-10 DIAGNOSIS — I252 Old myocardial infarction: Secondary | ICD-10-CM | POA: Diagnosis not present

## 2016-09-10 DIAGNOSIS — I5041 Acute combined systolic (congestive) and diastolic (congestive) heart failure: Secondary | ICD-10-CM | POA: Insufficient documentation

## 2016-09-10 DIAGNOSIS — J449 Chronic obstructive pulmonary disease, unspecified: Secondary | ICD-10-CM | POA: Diagnosis not present

## 2016-09-10 DIAGNOSIS — Y929 Unspecified place or not applicable: Secondary | ICD-10-CM | POA: Diagnosis not present

## 2016-09-10 DIAGNOSIS — I2510 Atherosclerotic heart disease of native coronary artery without angina pectoris: Secondary | ICD-10-CM | POA: Insufficient documentation

## 2016-09-10 DIAGNOSIS — R109 Unspecified abdominal pain: Secondary | ICD-10-CM | POA: Insufficient documentation

## 2016-09-10 DIAGNOSIS — G8918 Other acute postprocedural pain: Secondary | ICD-10-CM | POA: Diagnosis not present

## 2016-09-10 DIAGNOSIS — Z992 Dependence on renal dialysis: Secondary | ICD-10-CM | POA: Insufficient documentation

## 2016-09-10 DIAGNOSIS — I132 Hypertensive heart and chronic kidney disease with heart failure and with stage 5 chronic kidney disease, or end stage renal disease: Secondary | ICD-10-CM | POA: Diagnosis not present

## 2016-09-10 DIAGNOSIS — N186 End stage renal disease: Secondary | ICD-10-CM | POA: Diagnosis not present

## 2016-09-10 DIAGNOSIS — R52 Pain, unspecified: Secondary | ICD-10-CM

## 2016-09-10 LAB — CBC
HCT: 33.1 % — ABNORMAL LOW (ref 36.0–46.0)
Hemoglobin: 10.4 g/dL — ABNORMAL LOW (ref 12.0–15.0)
MCH: 30.2 pg (ref 26.0–34.0)
MCHC: 31.4 g/dL (ref 30.0–36.0)
MCV: 96.2 fL (ref 78.0–100.0)
Platelets: 214 10*3/uL (ref 150–400)
RBC: 3.44 MIL/uL — ABNORMAL LOW (ref 3.87–5.11)
RDW: 16.4 % — ABNORMAL HIGH (ref 11.5–15.5)
WBC: 7.3 10*3/uL (ref 4.0–10.5)

## 2016-09-10 LAB — BASIC METABOLIC PANEL
Anion gap: 16 — ABNORMAL HIGH (ref 5–15)
BUN: 36 mg/dL — ABNORMAL HIGH (ref 6–20)
CO2: 25 mmol/L (ref 22–32)
Calcium: 9 mg/dL (ref 8.9–10.3)
Chloride: 97 mmol/L — ABNORMAL LOW (ref 101–111)
Creatinine, Ser: 6.14 mg/dL — ABNORMAL HIGH (ref 0.44–1.00)
GFR calc Af Amer: 9 mL/min — ABNORMAL LOW (ref >60)
GFR calc non Af Amer: 8 mL/min — ABNORMAL LOW (ref >60)
Glucose, Bld: 135 mg/dL — ABNORMAL HIGH (ref 65–99)
Potassium: 5.8 mmol/L — ABNORMAL HIGH (ref 3.5–5.1)
Sodium: 138 mmol/L (ref 135–145)

## 2016-09-10 MED ORDER — OXYCODONE-ACETAMINOPHEN 5-325 MG PO TABS
1.0000 | ORAL_TABLET | Freq: Once | ORAL | Status: AC
  Administered 2016-09-10: 1 via ORAL
  Filled 2016-09-10: qty 1

## 2016-09-10 NOTE — ED Triage Notes (Signed)
Pt arrived by [facility]. Had angiogram left groin on Friday. Increase in pain on Saturday. Pt bent down to pick something up today and had increase in pain, reports having a knot in her groin and moderate bruising noted to groin and thigh.

## 2016-09-10 NOTE — ED Provider Notes (Signed)
[facility] Provider Note   CSN: [id number] Arrival date & time: 09/10/16  [id number]  By signing my name below, I, Majel HomerPeyton Lee, attest that this documentation has been prepared under the direction and in the presence of Mancel BaleElliott Rubee Vega, MD . Electronically Signed: Majel HomerPeyton Lee, Scribe. 09/10/2016. 11:17 PM.  History   Chief Complaint Chief Complaint  Patient presents with  . Post-op Problem  . Leg Pain   The history is provided by the patient. No language interpreter was used.   HPI Comments: [name] is a 35 y.o. female with PMHx of CKD and Type I DM, brought in by EMS to the Emergency Department complaining of intermittent, pain to her left groin that began 5 days ago. Pt reports she had an angiogram performed 4 days ago and woke up with pain to her left groin the next morning. She states she "bent over to pick something up" today and experienced worsening pain to the area. She reports she now has a "knot" on her left groin with associated bruising and "warmth." She states she last dialyzed yesterday. Pt denies appetite change and fever.   Past Medical History:  Diagnosis Date  . Anemia   . Arthritis    "left hand" (09/15/2013)  . Asthma   . Chronic bronchitis (HCC)    "just about q yr" (09/15/2013)  . Chronic kidney disease    "low kidney function" (09/15/2013)  . COPD (chronic obstructive pulmonary disease) (HCC)   . Coronary artery disease   . Hyperlipidemia   . Hypertension   . Migraine    "get them alot" (09/15/2013)  . Myocardial infarction 04/2015   NSTEMI  . Normal coronary arteries    by cardiac catheterization 09/20/13  . Pneumonia    "couple times; have it now" (09/15/2013)  . Shortness of breath    "just recently; related to the pneumonia" (09/15/2013)  . Type 1 diabetes mellitus (HCC)    type 2    Patient Active Problem List   Diagnosis Date Noted  . 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 Great toe  amputation status, right (HCC) 08/01/2016  . Diabetic osteomyelitis (HCC)   . Gangrene (HCC)   . 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 Diabetic foot infection (HCC) 06/20/2016  . ESRD (end stage renal disease) on dialysis (HCC) 06/20/2016  . Diabetic foot ulcer (HCC) 06/20/2016  . Pre-operative clearance 10/12/2015  . Normal coronary arteries 10/12/2015  . NSTEMI- type 2, Troponin 11.2 05/11/2015  . Respiratory failure requiring intubation (HCC) 05/10/2015  . History of arthroscopy of right shoulder-05/10/15 05/10/2015  . CKD (chronic kidney disease) stage 5, GFR less than 15 ml/min (HCC) 09/21/2014  . Unspecified asthma(493.90) 10/25/2013  . Acute combined systolic and diastolic heart failure (HCC) 09/21/2013  . Non-ischemic cardiomyopathy - by echo 8/16- EF 35-40% 09/16/2013  . Morbid obesity-  09/16/2013  . 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) 09/15/2013  . Normocytic anemia 09/15/2013  . Lower extremity edema 09/15/2013  . Hyperlipidemia   . Hypertension     Past Surgical History:  Procedure Laterality Date  . AMPUTATION Right 06/25/2016   Procedure: Amputation Right Great Toe at the Metatarsophalangeal Joint;  Surgeon: Nadara MustardMarcus V Duda, MD;  [facility];  Service: Orthopedics;  Laterality: Right;  . AV FISTULA PLACEMENT Left 03/27/2015   Procedure: CREATION RADIAL CEPHALIC ARTERIOVENOUS FISTULA;  Surgeon: Chuck Hinthristopher S Dickson, MD;  [facility];  Service: Vascular;  Laterality: Left;  . AV FISTULA PLACEMENT Left 11/23/2015   Procedure:  LEFT ARM BASILIC VEIN TRANSPOSITION  ;  Surgeon: Chuck Hint, MD;  [facility];  Service: Vascular;  Laterality: Left;  . CESAREAN SECTION  1999; 2006  . CORONARY ANGIOGRAM  09/20/2013   Procedure: CORONARY ANGIOGRAM;  Surgeon: Runell Gess, MD;  [facility] LAB;  Service: Cardiovascular;;  . FINGER SURGERY Left 1985   3rd and 4th digits reconstructed after cut off" (09/15/2013)  .  PERIPHERAL VASCULAR CATHETERIZATION N/A 09/05/2016   Procedure: Abdominal Aortogram;  Surgeon: Sherren Kerns, MD;  Location: [facility] INVAS[facility] CV LAB;  Service: Cardiovascular;  Laterality: N/A;  . PERIPHERAL VASCULAR CATHETERIZATION Bilateral 09/05/2016   Procedure: Lower Extremity Angiography;  Surgeon: Sherren Kerns, MD;  [facility] CV [facility];  Service: Cardiovascular;  Laterality: Bilateral;  . PERIPHERAL VASCULAR CATHETERIZATION Right 09/05/2016   Procedure: Peripheral Vascular Balloon Angioplasty;  Surgeon: Sherren Kerns, MD;  Location: [facility] INVASIVE CV LAB;  Service: Cardiovascular;  Laterality: Right;  peroneal and AT  . SHOULDER ARTHROSCOPY WITH BICEPSTENOTOMY Right 05/10/2015   Procedure: RIGHT SHOULDER ARTHROSCOPY WITH BICEPS TENOTOMY, DEBRIDEMENT LABRAL TEAR;  Surgeon: Jones Broom, MD;  Location: [facility] OR;  Service: Orthopedics;  Laterality: Right;  Right shoulder arthroscopy biceps tenotomy, debridement labral tear  . TONSILLECTOMY  1997  . TUBAL LIGATION  2006    OB History    No data available       Home Medications    Prior to Admission medications   Medication Sig Start Date End Date Taking? Authorizing Provider  albuterol (PROVENTIL HFA;VENTOLIN HFA) 108 (90 BASE) MCG/ACT inhaler Inhale 2 puffs into the lungs every 6 (six) hours as needed for wheezing or shortness of breath.    Yes Historical Provider, MD  albuterol (PROVENTIL) (2.5 MG/3ML) 0.083% nebulizer solution Take 2.5 mg by nebulization every 6 (six) hours as needed for wheezing or shortness of breath.   Yes Historical Provider, MD  atorvastatin (LIPITOR) 40 MG tablet Take 40 mg by mouth at bedtime.   Yes Historical Provider, MD  BIOTIN PO Take 1 tablet by mouth daily.   Yes Historical Provider, MD  budesonide (PULMICORT) 0.25 MG/2ML nebulizer solution Take 0.25 mg by nebulization 2 (two) times daily as needed (shortness of breath or wheezing).   Yes Historical Provider, MD  carvedilol (COREG) 25 MG tablet  Take 25 mg by mouth 2 (two) times daily.   Yes Historical Provider, MD  ciprofloxacin (CIPRO) 500 MG tablet Take 1 tablet (500 mg total) by mouth at bedtime. 09/03/16  Yes Cliffton Asters, MD  clopidogrel (PLAVIX) 75 MG tablet Take 1 tablet (75 mg total) by mouth daily. 09/05/16  Yes Sherren Kerns, MD  DULoxetine (CYMBALTA) 30 MG capsule Take 1 capsule (30 mg total) by mouth daily. Qam 07/21/16  Yes Nadara Mustard, MD  hydrOXYzine (ATARAX/VISTARIL) 25 MG tablet Take 1 tablet (25 mg total) by mouth every 6 (six) hours. 03/30/16  Yes Rolland Porter, MD  insulin aspart (NOVOLOG FLEXPEN) 100 UNIT/ML FlexPen Inject 4-12 Units into the skin 3 (three) times daily with meals. Per sliding scale - based on carb count and CBG    Yes Historical Provider, MD  Insulin Glargine (LANTUS SOLOSTAR) 100 UNIT/ML Solostar Pen Inject 10 Units into the skin at bedtime. Patient taking differently: Inject 15 Units into the skin at bedtime.  06/26/16  Yes Richarda Overlie, MD  isosorbide dinitrate (ISORDIL) 20 MG tablet Take 20 mg by  mouth 3 (three) times daily.   Yes Historical Provider, MD  Melatonin 5 MG TABS Take 5 mg by mouth at bedtime as needed (sleep).   Yes Historical Provider, MD  metroNIDAZOLE (FLAGYL) 500 MG tablet Take 1 tablet (500 mg total) by mouth 2 (two) times daily. 09/03/16  Yes Cliffton Asters, MD  nitroGLYCERIN (NITRODUR - DOSED IN MG/24 HR) 0.2 mg/hr patch Place 1 patch (0.2 mg total) onto the skin daily. 07/22/16  Yes Nadara Mustard, MD  nystatin (MYCOSTATIN/NYSTOP) powder Apply topically 4 (four) times daily. To each groin area 09/05/16  Yes Sherren Kerns, MD  oxyCODONE (OXY IR/ROXICODONE) 5 MG immediate release tablet Take 1 tablet (5 mg total) by mouth every 4 (four) hours as needed for severe pain. 08/01/16  Yes Nadara Mustard, MD  pregabalin (LYRICA) 50 MG capsule Take 1 capsule (50 mg total) by mouth 3 (three) times daily. 07/21/16  Yes Berton Lan, NP  sevelamer carbonate (RENVELA) 800 MG tablet Take 3,200  mg by mouth 3 (three) times daily with meals.   Yes Historical Provider, MD  silver sulfADIAZINE (SILVADENE) 1 % cream Apply 1 application topically daily. Apply to affected area daily plus dry dressing 08/25/16  Yes Nadara Mustard, MD  oxyCODONE-acetaminophen (PERCOCET) 5-325 MG tablet Take 1 tablet by mouth every 4 (four) hours as needed for severe pain. 09/11/16   Mancel Bale, MD    Family History Family History  Problem Relation Age of Onset  . Diabetes Mother   . Stroke Mother   . Hypertension Father   . Hyperlipidemia Father   . Cancer - Lung Father   . Diabetes Maternal Grandmother   . Cancer Paternal Grandmother     Social History Social History  Substance Use Topics  . Smoking status: Never Smoker  . Smokeless tobacco: Never Used  . Alcohol use No     Allergies   Aspirin; Sulfur; and Tramadol   Review of Systems Review of Systems  Constitutional: Negative for appetite change and fever.  Musculoskeletal: Positive for myalgias (left groin).  Skin: Positive for color change.   Physical Exam Updated Vital Signs BP 148/87   Pulse 101   Temp 97.9 F (36.6 C) (Oral)   Resp 16   Ht 5\' 8"  (1.727 m)   Wt 243 lb (110.2 kg)   LMP 08/06/2016   SpO2 91%   BMI 36.95 kg/m   Physical Exam  Constitutional: She appears well-developed and well-nourished. No distress.  HENT:  Head: Normocephalic and atraumatic.  Neck: Neck supple.  Cardiovascular:  Tachycardic  Pulmonary/Chest: Effort normal.  Musculoskeletal:  Left groin with moderate subacute ecchymotic changes, and localized tenderness with mass in area of evident puncture over the femoral artery. This mass is not pulsatile.  Neurological: She is alert.  Skin: She is not diaphoretic.  Psychiatric: She has a normal mood and affect. Her behavior is normal. Thought content normal.  Nursing note and vitals reviewed.  ED Treatments / Results  Labs (all labs ordered are listed, but only abnormal results are  displayed) Labs Reviewed  CBC - Abnormal; Notable for the following:       Result Value   RBC 3.44 (*)    Hemoglobin 10.4 (*)    HCT 33.1 (*)    RDW 16.4 (*)    All other components within normal limits  BASIC METABOLIC PANEL - Abnormal; Notable for the following:    Potassium 5.8 (*)    Chloride 97 (*)  Glucose, Bld 135 (*)    BUN 36 (*)    Creatinine, Ser 6.14 (*)    GFR calc non Af Amer 8 (*)    GFR calc Af Amer 9 (*)    Anion gap 16 (*)    All other components within normal limits    EKG  EKG Interpretation None       Radiology Ct Pelvis Wo Contrast  Result Date: 09/11/2016 CLINICAL DATA:  35 y/o F; post angiogram with mass in the left groin. EXAM: CT PELVIS WITHOUT CONTRAST TECHNIQUE: Multidetector CT imaging of the pelvis was performed following the standard protocol without intravenous contrast. COMPARISON:  None. FINDINGS: Urinary Tract:  No abnormality visualized. Bowel:  Unremarkable visualized pelvic bowel loops. Vascular/Lymphatic: No pathologically enlarged lymph node. Extensive vascular calcifications of the iliac and femoral arteries. Reproductive:  No mass or other significant abnormality Other: Ill-defined nodularity within the left groin subcutaneous fat measuring approximately 18 x 67 x 65 mm (AP x ML x CC) series 201, image 41 and series 203, image 57. Given recent catheterizations probably represents a hematoma. There is stranding of fat extending throughout the left groin compatible with reactive edema. No retroperitoneal extension of the hematoma is present. Musculoskeletal: No suspicious bone lesions identified. IMPRESSION: Ill-defined nodularity within the left groin subcutaneous fat is probably a hematoma in the setting of catheterization spanning up to the 6.7 cm, approximately 40 cc. There is surrounding edema/infiltration in fat. No retroperitoneal extension. Electronically Signed   By: Mitzi Hansen M.D.   On: 09/11/2016 00:49     Procedures Procedures (including critical care time)  Medications Ordered in ED Medications  oxyCODONE-acetaminophen (PERCOCET/ROXICET) 5-325 MG per tablet 1 tablet (1 tablet Oral Given 09/10/16 2318)    DIAGNOSTIC STUDIES:  Oxygen Saturation is 91% on RA, low by my interpretation.   COORDINATION OF CARE:  11:10 PM Discussed treatment plan with pt at bedside and pt agreed to plan.  Initial Impression / Assessment and Plan / ED Course  I have reviewed the triage vital signs and the nursing notes.  Pertinent labs & imaging results that were available during my care of the patient were reviewed by me and considered in my medical decision making (see chart for details).  Clinical Course as of Sep 12 111  Thu Sep 11, 2016  0106 40 cc hematoma left groin CT PELVIS WO CONTRAST [EW]    Clinical Course User Index [EW] Mancel Bale, MD    Medications  oxyCODONE-acetaminophen (PERCOCET/ROXICET) 5-325 MG per tablet 1 tablet (1 tablet Oral Given 09/10/16 2318)    Patient Vitals for the past 24 hrs:  BP Temp Temp src Pulse Resp SpO2 Height Weight  09/11/16 0000 - - - 105 - 97 % - -  09/10/16 2330 155/86 - - 106 - 93 % - -  09/10/16 2245 154/75 - - 102 - 94 % - -  09/10/16 2215 148/87 - - 101 - 91 % - -  09/10/16 2200 132/83 - - 103 - 91 % - -  09/10/16 2139 146/74 - - 100 16 95 % - -  09/10/16 1835 - - - - - - 5\' 8"  (1.727 m) 243 lb (110.2 kg)  09/10/16 1833 146/81 97.9 F (36.6 C) Oral 102 16 97 % - -    1:12 AM Reevaluation with update and discussion. After initial assessment and treatment, an updated evaluation reveals She feels better at this time. Left groin mass is unchanged in size. Findings discussed with patient  and husband, all questions answered. Dennice Tindol L    Final Clinical Impressions(s) / ED Diagnoses   Final diagnoses:  Pain  Post-operative pain  Hematoma of groin, initial encounter   Left groin hematoma without suspicion or evidence for aneurysmal  changes. Doubt infection, or impending vascular collapse. Incidental mild hyperkalemia is likely related to hemolysis during blood sampling. Patient is scheduled for dialysis later this morning.   Nursing Notes Reviewed/ Care Coordinated Applicable Imaging Reviewed Interpretation of Laboratory Data incorporated into ED treatment  The patient appears reasonably screened and/or stabilized for discharge and I doubt any other medical condition or other [facility] requiring further screening, evaluation, or treatment in the ED at this time prior to discharge.  Plan: Home Medications- usual; Home Treatments- rest, ice TID; return here if the recommended treatment, does not improve the symptoms; Recommended follow up- PCP and Vascular prn   New Prescriptions New Prescriptions   OXYCODONE-ACETAMINOPHEN (PERCOCET) 5-325 MG TABLET    Take 1 tablet by mouth every 4 (four) hours as needed for severe pain.   I personally performed the services described in this documentation, which was scribed in my presence. The recorded information has been reviewed and is accurate.     Mancel Bale, MD 09/11/16 [phone]

## 2016-09-11 ENCOUNTER — Emergency Department (HOSPITAL_COMMUNITY): Payer: Medicaid Other

## 2016-09-11 MED ORDER — OXYCODONE-ACETAMINOPHEN 5-325 MG PO TABS: 1.0000 | ORAL_TABLET | ORAL | 0 refills | Status: DC | PRN

## 2016-09-11 NOTE — Discharge Instructions (Signed)
Use ice on the sore area 3 or 4 times a day.

## 2016-09-11 NOTE — ED Notes (Signed)
Pt at CT

## 2016-09-17 ENCOUNTER — Ambulatory Visit: Payer: Medicaid Other | Admitting: Internal Medicine

## 2016-09-19 ENCOUNTER — Ambulatory Visit: Payer: Medicaid Other | Admitting: Nurse Practitioner

## 2016-09-21 ENCOUNTER — Emergency Department (HOSPITAL_COMMUNITY): Payer: Medicaid Other

## 2016-09-21 ENCOUNTER — Emergency Department (HOSPITAL_COMMUNITY)
Admission: EM | Admit: 2016-09-21 | Discharge: 2016-09-21 | Disposition: A | Discharge status: Home / Self Care | Payer: Medicaid Other | Attending: Emergency Medicine | Admitting: Emergency Medicine

## 2016-09-21 ENCOUNTER — Encounter (HOSPITAL_COMMUNITY): Payer: Self-pay | Admitting: Emergency Medicine

## 2016-09-21 DIAGNOSIS — L97909 Non-pressure chronic ulcer of unspecified part of unspecified lower leg with unspecified severity: Secondary | ICD-10-CM

## 2016-09-21 DIAGNOSIS — I5041 Acute combined systolic (congestive) and diastolic (congestive) heart failure: Secondary | ICD-10-CM | POA: Diagnosis not present

## 2016-09-21 DIAGNOSIS — I70245 Atherosclerosis of native arteries of left leg with ulceration of other part of foot: Secondary | ICD-10-CM | POA: Diagnosis not present

## 2016-09-21 DIAGNOSIS — I252 Old myocardial infarction: Secondary | ICD-10-CM | POA: Diagnosis not present

## 2016-09-21 DIAGNOSIS — L97521 Non-pressure chronic ulcer of other part of left foot limited to breakdown of skin: Secondary | ICD-10-CM | POA: Insufficient documentation

## 2016-09-21 DIAGNOSIS — N186 End stage renal disease: Secondary | ICD-10-CM | POA: Insufficient documentation

## 2016-09-21 DIAGNOSIS — E1021 Type 1 diabetes mellitus with diabetic nephropathy: Secondary | ICD-10-CM | POA: Diagnosis not present

## 2016-09-21 DIAGNOSIS — Z992 Dependence on renal dialysis: Secondary | ICD-10-CM | POA: Diagnosis not present

## 2016-09-21 DIAGNOSIS — J449 Chronic obstructive pulmonary disease, unspecified: Secondary | ICD-10-CM | POA: Diagnosis not present

## 2016-09-21 DIAGNOSIS — E10621 Type 1 diabetes mellitus with foot ulcer: Secondary | ICD-10-CM | POA: Insufficient documentation

## 2016-09-21 DIAGNOSIS — I132 Hypertensive heart and chronic kidney disease with heart failure and with stage 5 chronic kidney disease, or end stage renal disease: Secondary | ICD-10-CM | POA: Insufficient documentation

## 2016-09-21 DIAGNOSIS — E08621 Diabetes mellitus due to underlying condition with foot ulcer: Secondary | ICD-10-CM

## 2016-09-21 DIAGNOSIS — I998 Other disorder of circulatory system: Secondary | ICD-10-CM

## 2016-09-21 DIAGNOSIS — M79675 Pain in left toe(s): Secondary | ICD-10-CM | POA: Diagnosis present

## 2016-09-21 DIAGNOSIS — Z79899 Other long term (current) drug therapy: Secondary | ICD-10-CM | POA: Diagnosis not present

## 2016-09-21 DIAGNOSIS — I739 Peripheral vascular disease, unspecified: Secondary | ICD-10-CM

## 2016-09-21 LAB — BASIC METABOLIC PANEL
ANION GAP: 11 (ref 5–15)
BUN: 18 mg/dL (ref 6–20)
CALCIUM: 8.9 mg/dL (ref 8.9–10.3)
CO2: 31 mmol/L (ref 22–32)
Chloride: 93 mmol/L — ABNORMAL LOW (ref 101–111)
Creatinine, Ser: 4.78 mg/dL — ABNORMAL HIGH (ref 0.44–1.00)
GFR calc non Af Amer: 11 mL/min — ABNORMAL LOW (ref >60)
GFR, EST AFRICAN AMERICAN: 12 mL/min — AB (ref >60)
Glucose, Bld: 156 mg/dL — ABNORMAL HIGH (ref 65–99)
POTASSIUM: 3.9 mmol/L (ref 3.5–5.1)
SODIUM: 135 mmol/L (ref 135–145)

## 2016-09-21 LAB — CBC WITH DIFFERENTIAL/PLATELET
BASOS ABS: 0.1 10*3/uL (ref 0.0–0.1)
BASOS PCT: 1 %
EOS ABS: 0.5 10*3/uL (ref 0.0–0.7)
EOS PCT: 7 %
HCT: 31 % — ABNORMAL LOW (ref 36.0–46.0)
Hemoglobin: 9.4 g/dL — ABNORMAL LOW (ref 12.0–15.0)
LYMPHS PCT: 17 %
Lymphs Abs: 1.2 10*3/uL (ref 0.7–4.0)
MCH: 29.8 pg (ref 26.0–34.0)
MCHC: 30.3 g/dL (ref 30.0–36.0)
MCV: 98.4 fL (ref 78.0–100.0)
MONO ABS: 0.4 10*3/uL (ref 0.1–1.0)
Monocytes Relative: 5 %
Neutro Abs: 4.8 10*3/uL (ref 1.7–7.7)
Neutrophils Relative %: 70 %
PLATELETS: 262 10*3/uL (ref 150–400)
RBC: 3.15 MIL/uL — AB (ref 3.87–5.11)
RDW: 17.7 % — AB (ref 11.5–15.5)
WBC: 6.8 10*3/uL (ref 4.0–10.5)

## 2016-09-21 MED ORDER — HYDROMORPHONE HCL 2 MG/ML IJ SOLN
1.0000 mg | Freq: Once | INTRAMUSCULAR | Status: AC
  Administered 2016-09-21: 1 mg via INTRAVENOUS
  Filled 2016-09-21: qty 1

## 2016-09-21 MED ORDER — OXYCODONE-ACETAMINOPHEN 5-325 MG PO TABS: 1.0000 | ORAL_TABLET | Freq: Four times a day (QID) | ORAL | 0 refills | Status: DC | PRN

## 2016-09-21 MED ORDER — CLINDAMYCIN HCL 150 MG PO CAPS: 300.0000 mg | ORAL_CAPSULE | Freq: Four times a day (QID) | ORAL | 0 refills | Status: DC

## 2016-09-21 NOTE — ED Provider Notes (Signed)
Patient sign out from Sabino Dick, NP.  "This is a 35 year old female with a history of diabetes, poor circulation in her extremities.  He states that she scraped the skin off the left fourth and fifth toe in September and has had poor healing.  Since that time she's noticed that she's had some discoloration for the past 3 days.  It is increasing in intensity.  The fifth toe is now black and the nail fell off this morning.  The fourth toe is purplish and extremely painful.  The third toe is starting to become discolored.  She also has noticed that there is some erythema up into the foot.  She states that her blood sugar has been under control for the past 3 months with the highest level of 190. She had right great toe amputation that had poor healing and required a arteriogram and balloon angioplasty to the leg to restore circulation so that healing could occur.  She states at that time they also looked at her left leg and, per her report.  There was some strictures, but fair to moderate circulation to the left foot.  She was instructed to put a nitroglycerin patch on each foot.  2.  Increased circulation.  She dose this  nightly" - per Sheilah Mins HPI  The patients xray does not show osteo, there is concern that the patients small toe is gangrenous and the 4th, 3rd and 2nd toe are also concerning for poor circulation. The nurse was able to palpate pulses manually and appreciated pulses through the doppler.   I spoke with Dr. Maryfrances Bunnell with [facility] and says the patient has Dr. [name] the orthopedic doctor, an I&D doctor and vascular surgeon (Dr. Darrick Penna) and that she would not benefit from admission unless Ortho plans to intervene within the next few days. I spoke with Dr. Ophelia Charter with [facility], who voiced that he understood the patients that the patients is saying her toes are worse and that the 5th toe which was not black is now black today. I discussed this with my attending, we verified that the patient  is on cipro and flagyl. We are adding Clindamycin and Percocet. Dr. Verdie Mosher discussed the plan with the patient, did the discharge paperwork and wrote prescriptions.   Marlon Pel, PA-C 09/21/16 2321    Lavera Guise, MD 09/23/16 [phone]

## 2016-09-21 NOTE — ED Triage Notes (Addendum)
Pt states she scraped L 4th and 5th toes in September and they have not healed and now the toes are discolored and the nail has come off.

## 2016-09-21 NOTE — Discharge Instructions (Signed)
Continue to take antibiotics prescribed by Dr. Orvan Falconer but take clindamycin as well.  Please call Dr. Audrie Lia office after the holidays to see if you could potentially move up your appointment before Jan 3.   Return to ED if you are experiencing fevers, confusion, or any other symptoms concerning to you.

## 2016-09-21 NOTE — ED Provider Notes (Signed)
[facility]-EMERGENCY DEPT Provider Note   CSN: [id number] Arrival date & time: 09/21/16  1812     History   Chief Complaint Chief Complaint  Patient presents with  . Toe Pain    HPI [name] is a 35 y.o. female.  This is a 35 year old female with a history of diabetes, poor circulation in her extremities.  He states that she scraped the skin off the left fourth and fifth toe in September and has had poor healing.  Since that time she's noticed that she's had some discoloration for the past 3 days.  It is increasing in intensity.  The fifth toe is now black and the nail fell off this morning.  The fourth toe is purplish and extremely painful.  The third toe is starting to become discolored.  She also has noticed that there is some erythema up into the foot.  She states that her blood sugar has been under control for the past 3 months with the highest level of 190. She had right great toe amputation that had poor healing and required a arteriogram and balloon angioplasty to the leg to restore circulation so that healing could occur.  She states at that time they also looked at her left leg and, per her report.  There was some strictures, but fair to moderate circulation to the left foot.  She was instructed to put a nitroglycerin patch on each foot.  2.  Increased circulation.  She dose this  nightly      Past Medical History:  Diagnosis Date  . Anemia   . Arthritis    "left hand" (09/15/2013)  . Asthma   . Chronic bronchitis (HCC)    "just about q yr" (09/15/2013)  . Chronic kidney disease    "low kidney function" (09/15/2013)  . COPD (chronic obstructive pulmonary disease) (HCC)   . Coronary artery disease   . Hyperlipidemia   . Hypertension   . Migraine    "get them alot" (09/15/2013)  . Myocardial infarction 04/2015   NSTEMI  . Normal coronary arteries    by cardiac catheterization 09/20/13  . Pneumonia    "couple times; have it now" (09/15/2013)  . Shortness of breath    "just recently; related to the pneumonia" (09/15/2013)  . Type 1 diabetes mellitus (HCC)    type 2    Patient Active Problem List   Diagnosis Date Noted  . 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 Great toe amputation status, right (HCC) 08/01/2016  . Diabetic osteomyelitis (HCC)   . Gangrene (HCC)   . 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 Diabetic foot infection (HCC) 06/20/2016  . ESRD (end stage renal disease) on dialysis (HCC) 06/20/2016  . Diabetic foot ulcer (HCC) 06/20/2016  . Pre-operative clearance 10/12/2015  . Normal coronary arteries 10/12/2015  . NSTEMI- type 2, Troponin 11.2 05/11/2015  . Respiratory failure requiring intubation (HCC) 05/10/2015  . History of arthroscopy of right shoulder-05/10/15 05/10/2015  . CKD (chronic kidney disease) stage 5, GFR less than 15 ml/min (HCC) 09/21/2014  . Unspecified asthma(493.90) 10/25/2013  . Acute combined systolic and diastolic heart failure (HCC) 09/21/2013  . Non-ischemic cardiomyopathy - by echo 8/16- EF 35-40% 09/16/2013  . Morbid obesity-  09/16/2013  . 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) 09/15/2013  . Normocytic anemia 09/15/2013  . Lower extremity edema 09/15/2013  . Hyperlipidemia   . Hypertension     Past Surgical  History:  Procedure Laterality Date  . AMPUTATION Right 06/25/2016   Procedure: Amputation Right Great Toe at the Metatarsophalangeal Joint;  Surgeon: Nadara Mustard, MD;  Location: [facility] OR;  Service: Orthopedics;  Laterality: Right;  . AV FISTULA PLACEMENT Left 03/27/2015   Procedure: CREATION RADIAL CEPHALIC ARTERIOVENOUS FISTULA;  Surgeon: Chuck Hint, MD;  Location: [facility] OR;  Service: Vascular;  Laterality: Left;  . AV FISTULA PLACEMENT Left 11/23/2015   Procedure:  LEFT ARM BASILIC VEIN TRANSPOSITION  ;  Surgeon: Chuck Hint, MD;  Location: [facility] OR;  Service: Vascular;  Laterality: Left;  . CESAREAN  SECTION  1999; 2006  . CORONARY ANGIOGRAM  09/20/2013   Procedure: CORONARY ANGIOGRAM;  Surgeon: Runell Gess, MD;  Location: [facility] CATH LAB;  Service: Cardiovascular;;  . FINGER SURGERY Left 1985   3rd and 4th digits reconstructed after cut off" (09/15/2013)  . PERIPHERAL VASCULAR CATHETERIZATION N/A 09/05/2016   Procedure: Abdominal Aortogram;  Surgeon: Sherren Kerns, MD;  Location: [facility] INVASIVE CV LAB;  Service: Cardiovascular;  Laterality: N/A;  . PERIPHERAL VASCULAR CATHETERIZATION Bilateral 09/05/2016   Procedure: Lower Extremity Angiography;  Surgeon: Sherren Kerns, MD;  Location: [facility] INVASIVE CV LAB;  Service: Cardiovascular;  Laterality: Bilateral;  . PERIPHERAL VASCULAR CATHETERIZATION Right 09/05/2016   Procedure: Peripheral Vascular Balloon Angioplasty;  Surgeon: Sherren Kerns, MD;  Location: [facility] INVASIVE CV LAB;  Service: Cardiovascular;  Laterality: Right;  peroneal and AT  . SHOULDER ARTHROSCOPY WITH BICEPSTENOTOMY Right 05/10/2015   Procedure: RIGHT SHOULDER ARTHROSCOPY WITH BICEPS TENOTOMY, DEBRIDEMENT LABRAL TEAR;  Surgeon: Jones Broom, MD;  Location: [facility] OR;  Service: Orthopedics;  Laterality: Right;  Right shoulder arthroscopy biceps tenotomy, debridement labral tear  . TONSILLECTOMY  1997  . TUBAL LIGATION  2006    OB History    No data available       Home Medications    Prior to Admission medications   Medication Sig Start Date End Date Taking? Authorizing Provider  albuterol (PROVENTIL HFA;VENTOLIN HFA) 108 (90 BASE) MCG/ACT inhaler Inhale 2 puffs into the lungs every 6 (six) hours as needed for wheezing or shortness of breath.    Yes Historical Provider, MD  albuterol (PROVENTIL) (2.5 MG/3ML) 0.083% nebulizer solution Take 2.5 mg by nebulization every 6 (six) hours as needed for wheezing or shortness of breath.   Yes Historical Provider, MD  atorvastatin (LIPITOR) 40 MG tablet Take 40 mg by mouth at bedtime.   Yes Historical Provider, MD  BIOTIN PO Take 1  tablet by mouth daily.   Yes Historical Provider, MD  budesonide (PULMICORT) 0.25 MG/2ML nebulizer solution Take 0.25 mg by nebulization 2 (two) times daily as needed (shortness of breath or wheezing).   Yes Historical Provider, MD  carvedilol (COREG) 25 MG tablet Take 25 mg by mouth 2 (two) times daily.   Yes Historical Provider, MD  clopidogrel (PLAVIX) 75 MG tablet Take 1 tablet (75 mg total) by mouth daily. 09/05/16  Yes Sherren Kerns, MD  DULoxetine (CYMBALTA) 30 MG capsule Take 1 capsule (30 mg total) by mouth daily. Qam 07/21/16  Yes Nadara Mustard, MD  hydrOXYzine (ATARAX/VISTARIL) 25 MG tablet Take 1 tablet (25 mg total) by mouth every 6 (six) hours. 03/30/16  Yes Rolland Porter, MD  insulin aspart (NOVOLOG FLEXPEN) 100 UNIT/ML FlexPen Inject 4-12 Units into the skin 3 (three) times daily with meals. Per sliding scale - based on carb count and CBG    Yes Historical Provider, MD  Insulin  Glargine (LANTUS SOLOSTAR) 100 UNIT/ML Solostar Pen Inject 10 Units into the skin at bedtime. Patient taking differently: Inject 15 Units into the skin at bedtime.  06/26/16  Yes Richarda Overlie, MD  isosorbide dinitrate (ISORDIL) 20 MG tablet Take 20 mg by mouth 3 (three) times daily.   Yes Historical Provider, MD  Melatonin 5 MG TABS Take 5 mg by mouth at bedtime as needed (sleep).   Yes Historical Provider, MD  metroNIDAZOLE (FLAGYL) 500 MG tablet Take 1 tablet (500 mg total) by mouth 2 (two) times daily. 09/03/16  Yes Cliffton Asters, MD  nitroGLYCERIN (NITRODUR - DOSED IN MG/24 HR) 0.2 mg/hr patch Place 1 patch (0.2 mg total) onto the skin daily. 07/22/16  Yes Nadara Mustard, MD  nystatin (MYCOSTATIN/NYSTOP) powder Apply topically 4 (four) times daily. To each groin area 09/05/16  Yes Sherren Kerns, MD  oxyCODONE (OXY IR/ROXICODONE) 5 MG immediate release tablet Take 1 tablet (5 mg total) by mouth every 4 (four) hours as needed for severe pain. 08/01/16  Yes Nadara Mustard, MD  oxyCODONE-acetaminophen (PERCOCET) 5-325  MG tablet Take 1 tablet by mouth every 4 (four) hours as needed for severe pain. 09/11/16  Yes Mancel Bale, MD  pregabalin (LYRICA) 50 MG capsule Take 1 capsule (50 mg total) by mouth 3 (three) times daily. 07/21/16  Yes Adonis Huguenin, NP  sevelamer carbonate (RENVELA) 800 MG tablet Take 3,200 mg by mouth 3 (three) times daily with meals.   Yes Historical Provider, MD  silver sulfADIAZINE (SILVADENE) 1 % cream Apply 1 application topically daily. Apply to affected area daily plus dry dressing 08/25/16  Yes Nadara Mustard, MD  ciprofloxacin (CIPRO) 500 MG tablet Take 1 tablet (500 mg total) by mouth at bedtime. Patient not taking: Reported on 09/21/2016 09/03/16   Cliffton Asters, MD  clindamycin (CLEOCIN) 150 MG capsule Take 2 capsules (300 mg total) by mouth 4 (four) times daily. 09/21/16   Lavera Guise, MD  oxyCODONE-acetaminophen (PERCOCET/ROXICET) 5-325 MG tablet Take 1-2 tablets by mouth every 6 (six) hours as needed for moderate pain or severe pain. 09/21/16   Lavera Guise, MD    Family History Family History  Problem Relation Age of Onset  . Diabetes Mother   . Stroke Mother   . Hypertension Father   . Hyperlipidemia Father   . Cancer - Lung Father   . Diabetes Maternal Grandmother   . Cancer Paternal Grandmother     Social History Social History  Substance Use Topics  . Smoking status: Never Smoker  . Smokeless tobacco: Never Used  . Alcohol use No     Allergies   Aspirin; Sulfur; and Tramadol   Review of Systems Review of Systems  Constitutional: Negative for fever.  Respiratory: Negative for shortness of breath.   Musculoskeletal: Negative for joint swelling.  Skin: Positive for wound.  Neurological: Negative for dizziness and headaches.  All other systems reviewed and are negative.    Physical Exam Updated Vital Signs BP 146/90   Pulse 93   Temp 97.8 F (36.6 C) (Oral)   Resp 18   Ht 5\' 8"  (1.727 m)   Wt 110.2 kg   LMP 08/29/2016   SpO2 94%   BMI  36.95 kg/m   Physical Exam  Constitutional: She appears well-developed and well-nourished.  Eyes: Pupils are equal, round, and reactive to light.  Neck: Normal range of motion.  Cardiovascular: Normal rate.   Pulmonary/Chest: Effort normal.  Abdominal: Soft.  Musculoskeletal:  Feet:  Neurological: She is alert.  Skin: Skin is warm.  Psychiatric: She has a normal mood and affect.  Nursing note and vitals reviewed.    ED Treatments / Results  Labs (all labs ordered are listed, but only abnormal results are displayed) Labs Reviewed  CBC WITH DIFFERENTIAL/PLATELET - Abnormal; Notable for the following:       Result Value   RBC 3.15 (*)    Hemoglobin 9.4 (*)    HCT 31.0 (*)    RDW 17.7 (*)    All other components within normal limits  BASIC METABOLIC PANEL - Abnormal; Notable for the following:    Chloride 93 (*)    Glucose, Bld 156 (*)    Creatinine, Ser 4.78 (*)    GFR calc non Af Amer 11 (*)    GFR calc Af Amer 12 (*)    All other components within normal limits  CULTURE, BLOOD (ROUTINE X 2)  CULTURE, BLOOD (ROUTINE X 2)    EKG  EKG Interpretation None       Radiology Dg Foot Complete Left  Result Date: 09/21/2016 CLINICAL DATA:  Foot pain for several months with increasing pain over the last few days, discolored toes, initial encounter EXAM: LEFT FOOT - COMPLETE 3+ VIEW COMPARISON:  08/25/2016 FINDINGS: No acute fracture or dislocation is noted. Diffuse vascular calcifications are noted. No bony erosion to suggest osteomyelitis is seen. Mild soft tissue swelling is noted along the distal foot. IMPRESSION: Soft tissue swelling without acute bony abnormality. Electronically Signed   By: Alcide CleverMark  Lukens M.D.   On: 09/21/2016 21:16    Procedures Procedures (including critical care time)  Medications Ordered in ED Medications  HYDROmorphone (DILAUDID) injection 1 mg (1 mg Intravenous Given 09/21/16 2116)     Initial Impression / Assessment and Plan / ED  Course  I have reviewed the triage vital signs and the nursing notes.  Pertinent labs & imaging results that were available during my care of the patient were reviewed by me and considered in my medical decision making (see chart for details).  Clinical Course   Very concerned for her toes 4 and 5, will obtain labs, x-ray, Doppler pulses     Final Clinical Impressions(s) / ED Diagnoses   Final diagnoses:  Ischemic toe  Peripheral vascular disease of lower extremity with ulceration (HCC)  Diabetic ulcer of toe of left foot associated with diabetes mellitus due to underlying condition, limited to breakdown of skin [facility](HCC)    New Prescriptions Discharge Medication List as of 09/21/2016 11:23 PM    START taking these medications   Details  clindamycin (CLEOCIN) 150 MG capsule Take 2 capsules (300 mg total) by mouth 4 (four) times daily., Starting Sun 09/21/2016, Print    !! oxyCODONE-acetaminophen (PERCOCET/ROXICET) 5-325 MG tablet Take 1-2 tablets by mouth every 6 (six) hours as needed for moderate pain or severe pain., Starting Sun 09/21/2016, Print     !! - Potential duplicate medications found. Please discuss with provider.       Earley FavorGail Elin Fenley, NP 09/22/16 0740    Earley FavorGail Easter Schinke, NP 09/22/16 [phone]    Lavera Guiseana Duo Liu, MD 09/23/16 [phone]

## 2016-09-21 NOTE — ED Provider Notes (Signed)
Medical screening examination/treatment/procedure(s) were conducted as a shared visit with non-physician practitioner(s) and myself.  I personally evaluated the patient during the encounter.   EKG Interpretation None      35 year old female with history of DM, ESRD on HD, PVD and CAD who presents with ischemic left toe. Has had chronic non-healing superficial wound to the left foot. Over past 3 days, left 4th and 5th toes have been black in nature. The 1st through 3rd toes have been red and discolored. Increased pain in these areas. No fever or chills. Concern for need for amputation and came to ED for evaluation.  On chart review, patient admitted earlier this year to diabetic ulcer infection with osteomyelitis of right foot requiring amputation of part of her toes. She underwent angiogram of lower extremities 09/05/2016 requiring bypass of large vessels of the right leg. Run-off on the left leg showing only small vessel disease, not amenable to bypass.   Palpable DP pulses on exam. 4th and 5th right toes black and dry. First through 3rd toes erythematous with skin break down. No drainage.  Blood work reassuring w/o significantly leukocytosis and vitals normal in ED. Dr Ophelia Charter was consulted regarding management and question admission for abx and early amputation. He felt that this could be managed as outpatient by Dr. [name] 10/01/2016 as scheduled. He did not think admission for operative management vs amputation in January would change outcome for her. She is still taking cipro and flagyl for diabetic ulcer infection per Dr. Orvan Falconer who she saw last week. Is not septic.  No longer on IV vancomycin at dialysis, so we will also prescribe her clindamycin. Strict return and follow-up instructions reviewed. Patient expressed understanding of all discharge instructions and felt comfortable with the plan of care.    Lavera Guise, MD 09/21/16 [phone]

## 2016-09-26 LAB — CULTURE, BLOOD (ROUTINE X 2)
Culture: NO GROWTH
Culture: NO GROWTH

## 2016-09-27 ENCOUNTER — Emergency Department (HOSPITAL_COMMUNITY)
Admission: EM | Admit: 2016-09-27 | Discharge: 2016-09-28 | Disposition: A | Discharge status: Home / Self Care | Payer: Medicaid Other | Attending: Emergency Medicine | Admitting: Emergency Medicine

## 2016-09-27 ENCOUNTER — Encounter (HOSPITAL_COMMUNITY): Payer: Self-pay | Admitting: Emergency Medicine

## 2016-09-27 ENCOUNTER — Emergency Department (HOSPITAL_COMMUNITY): Payer: Medicaid Other

## 2016-09-27 DIAGNOSIS — M79672 Pain in left foot: Secondary | ICD-10-CM

## 2016-09-27 DIAGNOSIS — E1040 Type 1 diabetes mellitus with diabetic neuropathy, unspecified: Secondary | ICD-10-CM | POA: Insufficient documentation

## 2016-09-27 DIAGNOSIS — I5041 Acute combined systolic (congestive) and diastolic (congestive) heart failure: Secondary | ICD-10-CM | POA: Insufficient documentation

## 2016-09-27 DIAGNOSIS — N185 Chronic kidney disease, stage 5: Secondary | ICD-10-CM | POA: Insufficient documentation

## 2016-09-27 DIAGNOSIS — S91102A Unspecified open wound of left great toe without damage to nail, initial encounter: Secondary | ICD-10-CM | POA: Diagnosis not present

## 2016-09-27 DIAGNOSIS — X58XXXA Exposure to other specified factors, initial encounter: Secondary | ICD-10-CM | POA: Insufficient documentation

## 2016-09-27 DIAGNOSIS — I252 Old myocardial infarction: Secondary | ICD-10-CM | POA: Insufficient documentation

## 2016-09-27 DIAGNOSIS — Y939 Activity, unspecified: Secondary | ICD-10-CM | POA: Diagnosis not present

## 2016-09-27 DIAGNOSIS — J449 Chronic obstructive pulmonary disease, unspecified: Secondary | ICD-10-CM | POA: Insufficient documentation

## 2016-09-27 DIAGNOSIS — Y929 Unspecified place or not applicable: Secondary | ICD-10-CM | POA: Diagnosis not present

## 2016-09-27 DIAGNOSIS — I132 Hypertensive heart and chronic kidney disease with heart failure and with stage 5 chronic kidney disease, or end stage renal disease: Secondary | ICD-10-CM | POA: Diagnosis not present

## 2016-09-27 DIAGNOSIS — I2510 Atherosclerotic heart disease of native coronary artery without angina pectoris: Secondary | ICD-10-CM | POA: Diagnosis not present

## 2016-09-27 DIAGNOSIS — Y999 Unspecified external cause status: Secondary | ICD-10-CM | POA: Diagnosis not present

## 2016-09-27 DIAGNOSIS — E1022 Type 1 diabetes mellitus with diabetic chronic kidney disease: Secondary | ICD-10-CM | POA: Diagnosis not present

## 2016-09-27 DIAGNOSIS — T148XXA Other injury of unspecified body region, initial encounter: Secondary | ICD-10-CM

## 2016-09-27 LAB — CBC WITH DIFFERENTIAL/PLATELET
BASOS PCT: 1 %
Basophils Absolute: 0 10*3/uL (ref 0.0–0.1)
EOS ABS: 0.2 10*3/uL (ref 0.0–0.7)
EOS PCT: 4 %
HCT: 35.1 % — ABNORMAL LOW (ref 36.0–46.0)
HEMOGLOBIN: 10.7 g/dL — AB (ref 12.0–15.0)
LYMPHS ABS: 1.1 10*3/uL (ref 0.7–4.0)
Lymphocytes Relative: 18 %
MCH: 30.6 pg (ref 26.0–34.0)
MCHC: 30.5 g/dL (ref 30.0–36.0)
MCV: 100.3 fL — ABNORMAL HIGH (ref 78.0–100.0)
Monocytes Absolute: 0.4 10*3/uL (ref 0.1–1.0)
Monocytes Relative: 7 %
NEUTROS PCT: 70 %
Neutro Abs: 4.2 10*3/uL (ref 1.7–7.7)
PLATELETS: 228 10*3/uL (ref 150–400)
RBC: 3.5 MIL/uL — AB (ref 3.87–5.11)
RDW: 18.9 % — ABNORMAL HIGH (ref 11.5–15.5)
WBC: 6 10*3/uL (ref 4.0–10.5)

## 2016-09-27 LAB — COMPREHENSIVE METABOLIC PANEL
ALBUMIN: 3.2 g/dL — AB (ref 3.5–5.0)
ALK PHOS: 265 U/L — AB (ref 38–126)
ALT: 19 U/L (ref 14–54)
AST: 29 U/L (ref 15–41)
Anion gap: 13 (ref 5–15)
BUN: 10 mg/dL (ref 6–20)
CHLORIDE: 95 mmol/L — AB (ref 101–111)
CO2: 30 mmol/L (ref 22–32)
CREATININE: 3.57 mg/dL — AB (ref 0.44–1.00)
Calcium: 9.1 mg/dL (ref 8.9–10.3)
GFR calc Af Amer: 18 mL/min — ABNORMAL LOW (ref >60)
GFR calc non Af Amer: 15 mL/min — ABNORMAL LOW (ref >60)
GLUCOSE: 119 mg/dL — AB (ref 65–99)
Potassium: 3.6 mmol/L (ref 3.5–5.1)
SODIUM: 138 mmol/L (ref 135–145)
Total Bilirubin: 2.9 mg/dL — ABNORMAL HIGH (ref 0.3–1.2)
Total Protein: 6.5 g/dL (ref 6.5–8.1)

## 2016-09-27 MED ORDER — HYDROMORPHONE HCL 2 MG/ML IJ SOLN
2.0000 mg | Freq: Once | INTRAMUSCULAR | Status: AC
Start: 2016-09-28 — End: 2016-09-28
  Administered 2016-09-28: 2 mg via INTRAMUSCULAR
  Filled 2016-09-27: qty 1

## 2016-09-27 NOTE — ED Notes (Signed)
Pt refused to change into gown, Pt stated it is to cold to change and im not changing. Pt was offered a warm blanket.

## 2016-09-27 NOTE — ED Notes (Signed)
Pt refused XR and states "why do I need another one? I just had one Christmas Eve.". Spoke to pt and pt agrees to speak to PA about imaging test. Per Dahlia Client, [place], will speak to pt regarding XR and changing in to gown for appropriate work up.

## 2016-09-27 NOTE — ED Triage Notes (Signed)
Patient reports chronic left foot pain with swelling and necrotic left 5th/4th toes unrelieved by precription Percocet . Denies fever or chills .

## 2016-09-27 NOTE — ED Provider Notes (Signed)
[facility] DEPT Provider Note   CSN: [id number] Arrival date & time: 09/27/16  2121     History   Chief Complaint Chief Complaint  Patient presents with  . Foot Pain    HPI [name] is a 35 y.o. female with a hx of DM, ESRD on HD, PVD and CAD who presents to the Emergency Department complaining of gradual, persistent, progressively worsening non-healing superficial wound to the left foot onset Sept 2017 now with ischemic left toe.  pt presented to the ED on 09/21/16 with 3 days of black 4th and 5th toes with increasing pain despite regular percocet usage.  Pt had imaging and bloodwork at that time.  Ortho (Dr. Lorin Mercy) consulted who recommended d/c home.  Pt has an appointment on 10/01/16 with Dr. Sharol Given.  She denies fever, chills, abd pain, N/V/D.  She reports her primary concern at this time is her uncontrolled pain.  She does not feel the necrosis or erythema has extended or worsened.  Pt reports planned amputation at the mid forefoot.  No known aggravating factors.  She reports she was receiving IV Vancomycin at dialysis but this has stopped.  She has been compliant with her Cipro and flagyl.  Clindamycin was added at the last ED visit and she has been compliant with this as well.    On chart review, patient admitted earlier this year to diabetic ulcer infection with osteomyelitis of right foot requiring amputation of part of her toes. She underwent angiogram of lower extremities 09/05/2016 requiring bypass of large vessels of the right leg. Run-off on the left leg showing only small vessel disease, not amenable to bypass.   The history is provided by the patient and medical records. No language interpreter was used.    Past Medical History:  Diagnosis Date  . Anemia   . Arthritis    "left hand" (09/15/2013)  . Asthma   . Chronic bronchitis ([place])    "just about q yr" (09/15/2013)  . Chronic kidney disease    "low kidney function" (09/15/2013)  . COPD (chronic obstructive pulmonary  disease) ([place])   . Coronary artery disease   . Hyperlipidemia   . Hypertension   . Migraine    "get them alot" (09/15/2013)  . Myocardial infarction 04/2015   NSTEMI  . Normal coronary arteries    by cardiac catheterization 09/20/13  . Pneumonia    "couple times; have it now" (09/15/2013)  . Shortness of breath    "just recently; related to the pneumonia" (09/15/2013)  . Type 1 diabetes mellitus ([place])    type 2    Patient Active Problem List   Diagnosis Date Noted  . 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 Great toe amputation status, right ([place]) 08/01/2016  . Diabetic osteomyelitis ([place])   . Gangrene ([place])   . Type 1 diabetes mellitus with nephropathy ([place])   . Diabetic foot infection ([place]) 06/20/2016  . ESRD (end stage renal disease) on dialysis ([place]) 06/20/2016  . Diabetic foot ulcer ([place]) 06/20/2016  . Pre-operative clearance 10/12/2015  . Normal coronary arteries 10/12/2015  . NSTEMI- type 2, Troponin 11.2 05/11/2015  . Respiratory failure requiring intubation ([place]) 05/10/2015  . History of arthroscopy of right shoulder-05/10/15 05/10/2015  . CKD (chronic kidney disease) stage 5, GFR less than 15 ml/min (HCC) 09/21/2014  . Unspecified asthma(493.90) 10/25/2013  . Acute combined systolic and diastolic heart failure ([place]) 09/21/2013  . Non-ischemic cardiomyopathy - by echo 8/16- EF  35-40% 09/16/2013  . Morbid obesity-  09/16/2013  . Uncontrolled type 2 diabetes with renal manifestation ([place]) 09/15/2013  . Normocytic anemia 09/15/2013  . Lower extremity edema 09/15/2013  . Hyperlipidemia   . Hypertension     Past Surgical History:  Procedure Laterality Date  . AMPUTATION Right 06/25/2016   Procedure: Amputation Right Great Toe at the Metatarsophalangeal Joint;  Surgeon: Newt Minion, MD;  Location: [facility];  Service: Orthopedics;  Laterality: Right;  . AV FISTULA PLACEMENT Left 03/27/2015   Procedure: CREATION RADIAL  CEPHALIC ARTERIOVENOUS FISTULA;  Surgeon: Angelia Mould, MD;  Location: [facility];  Service: Vascular;  Laterality: Left;  . AV FISTULA PLACEMENT Left 11/23/2015   Procedure:  LEFT ARM BASILIC VEIN TRANSPOSITION  ;  Surgeon: Angelia Mould, MD;  Location: [facility];  Service: Vascular;  Laterality: Left;  . CESAREAN SECTION  1999; 2006  . CORONARY ANGIOGRAM  09/20/2013   Procedure: CORONARY ANGIOGRAM;  Surgeon: Lorretta Harp, MD;  Location[facility] C[facility] LAB;  Service: Cardiovascular;;  . FINGER SURGERY Left 1985   3rd and 4th digits reconstructed after cut off" (09/15/2013)  . PERIPHERAL VASCULAR CATHETERIZATION N/A 09/05/2016   Procedure: Abdominal Aortogram;  Surgeon: Elam Dutch, MD;  Location: [facility] CV LAB;  Service: Cardiovascular;  Laterality: N/A;  . PERIPHERAL VASCULAR CATHETERIZATION Bilateral 09/05/2016   Procedure: Lower Extremity Angiography;  Surgeon: Elam Dutch, MD;  Location: [facility] CV LAB;  Service: Cardiovascular;  Laterality: Bilateral;  . PERIPHERAL VASCULAR CATHETERIZATION Right 09/05/2016   Procedure: Peripheral Vascular Balloon Angioplasty;  Surgeon: Elam Dutch, MD;  Location: [facility] CV LAB;  Service: Cardiovascular;  Laterality: Right;  peroneal and AT  . SHOULDER ARTHROSCOPY WITH BICEPSTENOTOMY Right 05/10/2015   Procedure: RIGHT SHOULDER ARTHROSCOPY WITH BICEPS TENOTOMY, DEBRIDEMENT LABRAL TEAR;  Surgeon: Tania Ade, MD;  Location: [facility];  Service: Orthopedics;  Laterality: Right;  Right shoulder arthroscopy biceps tenotomy, debridement labral tear  . TONSILLECTOMY  1997  . TUBAL LIGATION  2006    OB History    No data available       Home Medications    Prior to Admission medications   Medication Sig Start Date End Date Taking? Authorizing Provider  albuterol (PROVENTIL HFA;VENTOLIN HFA) 108 (90 BASE) MCG/ACT inhaler Inhale 2 puffs into the lungs every 6 (six) hours as needed for wheezing or shortness of breath.    Yes  Historical Provider, MD  albuterol (PROVENTIL) (2.5 MG/3ML) 0.083% nebulizer solution Take 2.5 mg by nebulization every 6 (six) hours as needed for wheezing or shortness of breath.   Yes Historical Provider, MD  atorvastatin (LIPITOR) 40 MG tablet Take 40 mg by mouth at bedtime.   Yes Historical Provider, MD  BIOTIN PO Take 1 tablet by mouth daily.   Yes Historical Provider, MD  budesonide (PULMICORT) 0.25 MG/2ML nebulizer solution Take 0.25 mg by nebulization 2 (two) times daily as needed (shortness of breath or wheezing).   Yes Historical Provider, MD  carvedilol (COREG) 25 MG tablet Take 25 mg by mouth 2 (two) times daily.   Yes Historical Provider, MD  clindamycin (CLEOCIN) 150 MG capsule Take 2 capsules (300 mg total) by mouth 4 (four) times daily. 09/21/16  Yes Forde Dandy, MD  clopidogrel (PLAVIX) 75 MG tablet Take 1 tablet (75 mg total) by mouth daily. 09/05/16  Yes Elam Dutch, MD  DULoxetine (CYMBALTA) 30 MG capsule Take 1 capsule (30 mg total) by mouth daily. Qam 07/21/16  Yes Newt Minion, MD  hydrOXYzine (ATARAX/VISTARIL) 25 MG tablet Take 1 tablet (25 mg total) by mouth every 6 (six) hours. 03/30/16  Yes Tanna Furry, MD  insulin aspart (NOVOLOG FLEXPEN) 100 UNIT/ML FlexPen Inject 4-12 Units into the skin 3 (three) times daily with meals. Per sliding scale - based on carb count and CBG    Yes Historical Provider, MD  Insulin Glargine (LANTUS SOLOSTAR) 100 UNIT/ML Solostar Pen Inject 10 Units into the skin at bedtime. Patient taking differently: Inject 15 Units into the skin at bedtime.  06/26/16  Yes Reyne Dumas, MD  isosorbide dinitrate (ISORDIL) 20 MG tablet Take 20 mg by mouth 3 (three) times daily.   Yes Historical Provider, MD  Melatonin 5 MG TABS Take 5 mg by mouth at bedtime as needed (sleep).   Yes Historical Provider, MD  metroNIDAZOLE (FLAGYL) 500 MG tablet Take 1 tablet (500 mg total) by mouth 2 (two) times daily. 09/03/16  Yes Michel Bickers, MD  nitroGLYCERIN (NITRODUR -  DOSED IN MG/24 HR) 0.2 mg/hr patch Place 1 patch (0.2 mg total) onto the skin daily. 07/22/16  Yes Newt Minion, MD  nystatin (MYCOSTATIN/NYSTOP) powder Apply topically 4 (four) times daily. To each groin area 09/05/16  Yes Elam Dutch, MD  pregabalin (LYRICA) 50 MG capsule Take 1 capsule (50 mg total) by mouth 3 (three) times daily. 07/21/16  Yes Suzan Slick, NP  sevelamer carbonate (RENVELA) 800 MG tablet Take 3,200 mg by mouth 3 (three) times daily with meals.   Yes Historical Provider, MD  silver sulfADIAZINE (SILVADENE) 1 % cream Apply 1 application topically daily. Apply to affected area daily plus dry dressing 08/25/16  Yes Newt Minion, MD  ciprofloxacin (CIPRO) 500 MG tablet Take 1 tablet (500 mg total) by mouth at bedtime. Patient not taking: Reported on 09/27/2016 09/03/16   Michel Bickers, MD  oxyCODONE (ROXICODONE) 5 MG immediate release tablet Take 1 tablet (5 mg total) by mouth every 6 (six) hours as needed for breakthrough pain. 09/28/16   Lucita Montoya, PA-C  oxyCODONE-acetaminophen (PERCOCET/ROXICET) 5-325 MG tablet Take 2 tablets by mouth every 8 (eight) hours as needed for severe pain. 09/28/16   Jarrett Soho Miana Politte, PA-C    Family History Family History  Problem Relation Age of Onset  . Diabetes Mother   . Stroke Mother   . Hypertension Father   . Hyperlipidemia Father   . Cancer - Lung Father   . Diabetes Maternal Grandmother   . Cancer Paternal Grandmother     Social History Social History  Substance Use Topics  . Smoking status: Never Smoker  . Smokeless tobacco: Never Used  . Alcohol use No     Allergies   Aspirin; Sulfur; and Tramadol   Review of Systems Review of Systems  Musculoskeletal: Positive for arthralgias.  Skin: Positive for wound.  All other systems reviewed and are negative.    Physical Exam Updated Vital Signs BP 152/89   Pulse 87   Temp 97.8 F (36.6 C) (Oral)   Resp 18   Ht 5' 8"  (1.727 m)   Wt 109.8 kg   LMP  08/29/2016   SpO2 100%   BMI 36.80 kg/m   Physical Exam  Constitutional: She appears well-developed and well-nourished. No distress.  Awake, alert, nontoxic appearance Pt tearful   HENT:  Head: Normocephalic and atraumatic.  Mouth/Throat: Oropharynx is clear and moist. No oropharyngeal exudate.  Eyes: Conjunctivae are normal. No scleral icterus.  Neck: Normal range of motion.  Neck supple.  Cardiovascular: Normal rate, regular rhythm and intact distal pulses.   Pulmonary/Chest: Effort normal and breath sounds normal. No respiratory distress. She has no wheezes.  Equal chest expansion  Abdominal: Soft. Bowel sounds are normal. She exhibits no mass. There is no tenderness. There is no rebound and no guarding.  Musculoskeletal: Normal range of motion. She exhibits no edema.       Feet:  Neurological: She is alert.  Speech is clear and goal oriented Moves extremities without ataxia  Skin: Skin is warm and dry. She is not diaphoretic.  Psychiatric: She has a normal mood and affect.  Nursing note and vitals reviewed.          ED Treatments / Results  Labs (all labs ordered are listed, but only abnormal results are displayed) Labs Reviewed  CBC WITH DIFFERENTIAL/PLATELET - Abnormal; Notable for the following:       Result Value   RBC 3.50 (*)    Hemoglobin 10.7 (*)    HCT 35.1 (*)    MCV 100.3 (*)    RDW 18.9 (*)    All other components within normal limits  COMPREHENSIVE METABOLIC PANEL - Abnormal; Notable for the following:    Chloride 95 (*)    Glucose, Bld 119 (*)    Creatinine, Ser 3.57 (*)    Albumin 3.2 (*)    Alkaline Phosphatase 265 (*)    Total Bilirubin 2.9 (*)    GFR calc non Af Amer 15 (*)    GFR calc Af Amer 18 (*)    All other components within normal limits  SEDIMENTATION RATE - Abnormal; Notable for the following:    Sed Rate 38 (*)    All other components within normal limits    Procedures Procedures (including critical care  time)  Medications Ordered in ED Medications  oxyCODONE-acetaminophen (PERCOCET/ROXICET) 5-325 MG per tablet 2 tablet (not administered)  HYDROmorphone (DILAUDID) injection 2 mg (2 mg Intramuscular Given 09/28/16 0003)     Initial Impression / Assessment and Plan / ED Course  I have reviewed the triage vital signs and the nursing notes.  Pertinent labs & imaging results that were available during my care of the patient were reviewed by me and considered in my medical decision making (see chart for details).  Clinical Course    Patient presents with a worsening pain in her left foot along with worsening infection. Patient with long-standing, nonhealing chronic wound. She has an appointment scheduled on 10/02/2015 with orthopedics for further evaluation and discussion of an amputation. Patient with large workup on 09/21/2016 which did not show a sepsis or osteomyelitis. Patient refusing repeat x-ray today. ESR is elevated from September however patient is well appearing. Labs are otherwise unchanged from evaluation last week. She is without tachycardia or fever.  Pulses in the left foot are palpable.  Discussed with patient admission for pain control versus attempting to continue pain control outpatient. She wishes for discharge home.  Patient given strict return precautions including fevers, spreading of redness, worsening pain or other concerns. She states understanding and is in agreement with the plan.  Final Clinical Impressions(s) / ED Diagnoses   Final diagnoses:  Foot pain, left  Nonhealing nonsurgical wound    New Prescriptions New Prescriptions   OXYCODONE (ROXICODONE) 5 MG IMMEDIATE RELEASE TABLET    Take 1 tablet (5 mg total) by mouth every 6 (six) hours as needed for breakthrough pain.   OXYCODONE-ACETAMINOPHEN (PERCOCET/ROXICET) 5-325 MG TABLET    Take  2 tablets by mouth every 8 (eight) hours as needed for severe pain.     Jarrett Soho Mckynzi Cammon, PA-C 09/28/16 [id number]     Varney Biles, MD 09/28/16 2303

## 2016-09-28 LAB — SEDIMENTATION RATE: Sed Rate: 38 mm/hr — ABNORMAL HIGH (ref 0–22)

## 2016-09-28 MED ORDER — OXYCODONE-ACETAMINOPHEN 5-325 MG PO TABS: 2.0000 | ORAL_TABLET | Freq: Three times a day (TID) | ORAL | 0 refills | Status: DC | PRN

## 2016-09-28 MED ORDER — OXYCODONE-ACETAMINOPHEN 5-325 MG PO TABS
2.0000 | ORAL_TABLET | Freq: Once | ORAL | Status: AC
  Administered 2016-09-28: 2 via ORAL
  Filled 2016-09-28: qty 2

## 2016-09-28 MED ORDER — OXYCODONE HCL 5 MG PO TABS: 5.0000 mg | ORAL_TABLET | Freq: Four times a day (QID) | ORAL | 0 refills | Status: DC | PRN

## 2016-09-28 NOTE — Discharge Instructions (Signed)
1. Medications: Take Percocet as prescribed, use oxycodone for breakthrough pain only, continue antibiotics, usual home medications 2. Treatment: rest, keep wounds clean and dry, drink plenty of fluids,  3. Follow Up: Please followup with orthopedics on 1/3 as scheduled; Please return to the ER for worsening symptoms, IF you develop fever, redness spreads or you have other concerns

## 2016-09-28 NOTE — ED Notes (Signed)
ED Provider at bedside. 

## 2016-10-01 ENCOUNTER — Encounter: Payer: Medicaid Other | Admitting: Vascular Surgery

## 2016-10-01 ENCOUNTER — Encounter (INDEPENDENT_AMBULATORY_CARE_PROVIDER_SITE_OTHER): Payer: Self-pay | Admitting: Orthopedic Surgery

## 2016-10-01 ENCOUNTER — Ambulatory Visit (INDEPENDENT_AMBULATORY_CARE_PROVIDER_SITE_OTHER): Payer: Medicaid Other | Admitting: Orthopedic Surgery

## 2016-10-01 VITALS — Ht 68.0 in | Wt 242.0 lb

## 2016-10-01 DIAGNOSIS — Z794 Long term (current) use of insulin: Secondary | ICD-10-CM | POA: Diagnosis not present

## 2016-10-01 DIAGNOSIS — E1122 Type 2 diabetes mellitus with diabetic chronic kidney disease: Secondary | ICD-10-CM

## 2016-10-01 DIAGNOSIS — E1165 Type 2 diabetes mellitus with hyperglycemia: Secondary | ICD-10-CM | POA: Diagnosis not present

## 2016-10-01 DIAGNOSIS — N185 Chronic kidney disease, stage 5: Secondary | ICD-10-CM | POA: Diagnosis not present

## 2016-10-01 DIAGNOSIS — I70263 Atherosclerosis of native arteries of extremities with gangrene, bilateral legs: Secondary | ICD-10-CM

## 2016-10-01 DIAGNOSIS — IMO0002 Reserved for concepts with insufficient information to code with codable children: Secondary | ICD-10-CM

## 2016-10-01 MED ORDER — OXYCODONE-ACETAMINOPHEN 5-325 MG PO TABS: 1.0000 | ORAL_TABLET | ORAL | 0 refills | Status: DC | PRN

## 2016-10-01 NOTE — Progress Notes (Signed)
Office Visit Note   Patient: [name]           Date of Birth: [birth year]           MRN: [id number] Visit Date: 10/01/2016              Requested by: [name], FNP [address] [place], [place] [address] PCP: [name], FNP   Assessment & Plan: Visit Diagnoses:  1. 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 Uncontrolled type 2 diabetes mellitus with stage 5 chronic kidney disease not on chronic dialysis, with long-term current use of insulin (HCC)     Plan: Patient has diabetic insensate neuropathy end-stage renal disease on dialysis she is status post angioplasty to the right lower extremity with excellent palpable dorsalis pedis pulse but still with ischemic forefoot changes due to her microcirculatory disease and patient has progressive dry gangrenous changes of all the toes in the left foot also due to advanced microcirculatory disease. We will plan for a bilateral transmetatarsal amputation. Patient states she needs to wait until next week. We'll set this up for Wednesday. Anticipate she will need to go to skilled nursing. Dialysis Tuesday Thursday Saturday. Risk and benefits were discussed including risk of the wound not healing.  Follow-Up Instructions: Return in about 2 weeks (around 10/15/2016).   Orders:  No orders of the defined types were placed in this encounter.  No orders of the defined types were placed in this encounter.     Procedures: No procedures performed   Clinical Data: No additional findings.   Subjective: Chief Complaint  Patient presents with  . Right Foot - Routine Post Op    R GT amputation at MTP joint 06/25/16. 98 days po. S/p RLE Angioplasty w/ Dr. Darrick Penna 09/05/16. There is fibrinous exudative tissue. There is no odor.   . Left Foot - Open Wound    Ischemic left foot. She has been to ER on 09/21/16 and again on 23/30/17. Xrays negative for osteomyelitis. She was put on  clindamycin, cipro, and flagyl. LLE angiography 09/05/16. Patient complains of increased ischemic pain.     HPI patient complains of increased ischemic pain in the forefoot bilaterally. She has been nonweightbearing in a wheelchair. She denies any odor or purulent drainage. Most recently patient was in the emergency room on 1224 due to left ischemic toe pain. She is currently on clindamycin and Cipro and Flagyl. Radiographs are negative for osteomyelitis. She has black gangrenous changes worse to the little toe on the left foot.  Review of Systems   Objective: Vital Signs: Ht 5\' 8"  (1.727 m)   Wt 242 lb (109.8 kg)   LMP 08/29/2016   BMI 36.80 kg/m   Physical Exam on examination patient is alert oriented no adenopathy well-dressed, affect over S2 effort. Examination she has excellent palpable dorsalis pedis pulse on the right status post revascularization on the right. Doppler was used and she has had good dorsalis pedis biphasic pulse on the left. She has ischemic gangrenous changes of all the toes on the left foot. She has exposed bone on the right foot beneath the second toe and an open ischemic wound status post the great toe amputation on the right. There is no ascending cellulitis no purulent abscess on either foot.  Ortho Exam  Specialty Comments:  No specialty comments available.  Imaging: No results found.   PMFS History: Patient Active Problem List   Diagnosis Date Noted  .  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 Great toe amputation status, right (HCC) 08/01/2016  . Diabetic osteomyelitis (HCC)   . Gangrene (HCC)   . 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 Diabetic foot infection (HCC) 06/20/2016  . ESRD (end stage renal disease) on dialysis (HCC) 06/20/2016  . Diabetic foot ulcer (HCC) 06/20/2016  . Pre-operative clearance 10/12/2015  . Normal coronary arteries 10/12/2015  . NSTEMI- type 2, Troponin 11.2  05/11/2015  . Respiratory failure requiring intubation (HCC) 05/10/2015  . History of arthroscopy of right shoulder-05/10/15 05/10/2015  . CKD (chronic kidney disease) stage 5, GFR less than 15 ml/min (HCC) 09/21/2014  . Unspecified asthma(493.90) 10/25/2013  . Acute combined systolic and diastolic heart failure (HCC) 09/21/2013  . Non-ischemic cardiomyopathy - by echo 8/16- EF 35-40% 09/16/2013  . Morbid obesity-  09/16/2013  . 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) 09/15/2013  . Normocytic anemia 09/15/2013  . Lower extremity edema 09/15/2013  . Hyperlipidemia   . Hypertension    Past Medical History:  Diagnosis Date  . Anemia   . Arthritis    "left hand" (09/15/2013)  . Asthma   . Chronic bronchitis (HCC)    "just about q yr" (09/15/2013)  . Chronic kidney disease    "low kidney function" (09/15/2013)  . COPD (chronic obstructive pulmonary disease) (HCC)   . Coronary artery disease   . Hyperlipidemia   . Hypertension   . Migraine    "get them alot" (09/15/2013)  . Myocardial infarction 04/2015   NSTEMI  . Normal coronary arteries    by cardiac catheterization 09/20/13  . Pneumonia    "couple times; have it now" (09/15/2013)  . Shortness of breath    "just recently; related to the pneumonia" (09/15/2013)  . Type 1 diabetes mellitus (HCC)    type 2    Family History  Problem Relation Age of Onset  . Diabetes Mother   . Stroke Mother   . Hypertension Father   . Hyperlipidemia Father   . Cancer - Lung Father   . Diabetes Maternal Grandmother   . Cancer Paternal Grandmother     Past Surgical History:  Procedure Laterality Date  . AMPUTATION Right 06/25/2016   Procedure: Amputation Right Great Toe at the Metatarsophalangeal Joint;  Surgeon: Nadara Mustard, MD;  Location: [facility] OR;  Service: Orthopedics;  Laterality: Right;  . AV FISTULA PLACEMENT Left 03/27/2015   Procedure: CREATION RADIAL CEPHALIC ARTERIOVENOUS FISTULA;  Surgeon: Chuck Hint, MD;  Location: [facility] OR;  Service: Vascular;  Laterality: Left;  . AV FISTULA PLACEMENT Left 11/23/2015   Procedure:  LEFT ARM BASILIC VEIN TRANSPOSITION  ;  Surgeon: Chuck Hint, MD;  Location: [facility] OR;  Service: Vascular;  Laterality: Left;  . CESAREAN SECTION  1999; 2006  . CORONARY ANGIOGRAM  09/20/2013   Procedure: CORONARY ANGIOGRAM;  Surgeon: Runell Gess, MD;  Location: [facility] CATH LAB;  Service: Cardiovascular;;  . FINGER SURGERY Left 1985   3rd and 4th digits reconstructed after cut off" (09/15/2013)  . PERIPHERAL VASCULAR CATHETERIZATION N/A 09/05/2016   Procedure: Abdominal Aortogram;  Surgeon: Sherren Kerns, MD;  Location: [facility] INVASIVE CV LAB;  Service: Cardiovascular;  Laterality: N/A;  . PERIPHERAL VASCULAR CATHETERIZATION Bilateral 09/05/2016   Procedure: Lower Extremity Angiography;  Surgeon: Sherren Kerns, MD;  Location: [facility] INVASIVE CV LAB;  Service: Cardiovascular;  Laterality: Bilateral;  . PERIPHERAL VASCULAR CATHETERIZATION Right 09/05/2016  Procedure: Peripheral Vascular Balloon Angioplasty;  Surgeon: Sherren Kerns, MD;  Location: [facility] INVASIVE CV LAB;  Service: Cardiovascular;  Laterality: Right;  peroneal and AT  . SHOULDER ARTHROSCOPY WITH BICEPSTENOTOMY Right 05/10/2015   Procedure: RIGHT SHOULDER ARTHROSCOPY WITH BICEPS TENOTOMY, DEBRIDEMENT LABRAL TEAR;  Surgeon: Jones Broom, MD;  Location: [facility] OR;  Service: Orthopedics;  Laterality: Right;  Right shoulder arthroscopy biceps tenotomy, debridement labral tear  . TONSILLECTOMY  1997  . TUBAL LIGATION  2006   Social History   Occupational History  . Student    Social History Main Topics  . Smoking status: Never Smoker  . Smokeless tobacco: Never Used  . Alcohol use No  . Drug use: No  . Sexual activity: Not Currently    Partners: Male    Birth control/ protection: Other-see comments     Comment: S/P tubal ligation

## 2016-10-01 NOTE — Addendum Note (Signed)
Addended by: Aldean Baker on: 10/01/2016 03:59 PM   Modules accepted: Orders

## 2016-10-02 ENCOUNTER — Telehealth (INDEPENDENT_AMBULATORY_CARE_PROVIDER_SITE_OTHER): Payer: Self-pay | Admitting: Orthopedic Surgery

## 2016-10-02 NOTE — Telephone Encounter (Signed)
Pulled narc report for pt and this was reviewed by Dr. [name]. I will call in the morning for auth for pt to have this filled.

## 2016-10-02 NOTE — Telephone Encounter (Signed)
Pt stated her oxycodone she got yesterday needed a prior auth, pt wanted to see if this was completed. Pt number Is [phone]

## 2016-10-03 ENCOUNTER — Telehealth (INDEPENDENT_AMBULATORY_CARE_PROVIDER_SITE_OTHER): Payer: Self-pay | Admitting: Orthopedic Surgery

## 2016-10-03 ENCOUNTER — Ambulatory Visit: Payer: Medicaid Other | Admitting: Surgery

## 2016-10-03 ENCOUNTER — Other Ambulatory Visit (INDEPENDENT_AMBULATORY_CARE_PROVIDER_SITE_OTHER): Payer: Self-pay

## 2016-10-03 MED ORDER — TRAMADOL HCL 50 MG PO TABS: 50.0000 mg | ORAL_TABLET | Freq: Four times a day (QID) | ORAL | 0 refills | Status: DC | PRN

## 2016-10-03 NOTE — Telephone Encounter (Signed)
Pt called asking if there was a lesser dose she could get an RX while waiting for insurance. CB: [phone]

## 2016-10-03 NOTE — Telephone Encounter (Signed)
Pt is sch for bilat transmet amps and she is pending approval for her percocet from medicaid is there anything else that she can have pending this approval for the pain.

## 2016-10-03 NOTE — Telephone Encounter (Signed)
I advised Dr. Roda Shutters that we could not give rx for norco because she would have to pick up the rx. Dr. Roda Shutters advised ok to give rx for tramadol and this was sent to the pharm. Called the pt to advise and she states that she can not tolerate tramadol and I advised that there is not another rx that we can call in for her everything else requires an original signature but that we have submit her request to her insurance and we are just pending the approva;[name] Pt voiced understanding and states " thanks any way"

## 2016-10-03 NOTE — Telephone Encounter (Signed)
Norco 5.  #[phone]

## 2016-10-03 NOTE — Progress Notes (Signed)
BHAVNA, LOMAX ([id number]) Visit Report for 10/03/2016 Allergy List Details Patient Name: [name], [name] Date of Service: 10/03/2016 9:00 AM Medical Record Patient Account Number: [id number] Number: Afful, RN, BSN, Treating RN: Dec 17, 1980 (35 y.o. [name] Date of Birth/Sex: Female) Other Clinician: Primary Care Physician: [name] Treating Evlyn Kanner Referring Physician: [name] Physician/Extender: Tania Ade in Treatment: 0 Allergies Active Allergies sulfur asprin tramadol Allergy Notes Electronic Signature(s) Signed: 10/03/2016 8:39:25 AM By: Elpidio Eric BSN, RN Entered By: Elpidio Eric on 10/03/2016 38:10:17

## 2016-10-03 NOTE — Telephone Encounter (Signed)
Patient calling for Rx authorization for Percocet...because she has medicaid precert is necessary. She is using the [facility]  [email or web address].

## 2016-10-03 NOTE — Progress Notes (Signed)
[name], [name] ([id number]) Visit Report for 10/03/2016 Education Assessment Details Patient Name: [name], [name] Date of Service: 10/03/2016 9:00 AM Medical Record Patient Account Number: [id number] Number: Afful, RN, BSN, Treating RN: September 10, 1981 (35 y.o. [place] Date of Birth/Sex: Female) Other Clinician: Primary Care Physician: [name] Treating Evlyn Kanner Referring Physician: [name] Physician/Extender: Tania Ade in Treatment: 0 Primary Learner Assessed: Patient Learning Preferences/Education Level/Primary Language Learning Preference: Explanation Highest Education Level: College or Above Preferred Language: English Cognitive Barrier Assessment/Beliefs Language Barrier: No Physical Barrier Assessment Impaired Vision: No Impaired Hearing: No Decreased Hand dexterity: No Knowledge/Comprehension Assessment Knowledge Level: High Comprehension Level: High Ability to understand written High instructions: Ability to understand verbal High instructions: Motivation Assessment Anxiety Level: Calm Cooperation: Cooperative Education Importance: Acknowledges Need Interest in Health Problems: Asks Questions Perception: Coherent Willingness to Engage in Self- High Management Activities: Readiness to Engage in Self- High Management Activities: Electronic Signature(s) JALIA, ZIELINSKI ([id number]) Signed: 10/03/2016 8:40:32 AM By: Elpidio Eric BSN, RN Entered By: Elpidio Eric on 10/03/2016 08:40:32

## 2016-10-03 NOTE — Telephone Encounter (Signed)
[name] has been faxed for [facility] approval of medication and will contact pt pending approval.

## 2016-10-06 ENCOUNTER — Telehealth (INDEPENDENT_AMBULATORY_CARE_PROVIDER_SITE_OTHER): Payer: Self-pay | Admitting: Orthopedic Surgery

## 2016-10-06 ENCOUNTER — Other Ambulatory Visit (INDEPENDENT_AMBULATORY_CARE_PROVIDER_SITE_OTHER): Payer: Self-pay | Admitting: Family

## 2016-10-06 NOTE — Telephone Encounter (Signed)
[name] called me back to confirm her surgery information. She is scheduled for bilateral transmet amp on Wed 10/08/2016.  She asked that she get a refill on the Percocet she was taking.  States she was only able to get 6 or 7 tabs last time due to insurance approval.

## 2016-10-06 NOTE — Telephone Encounter (Signed)
Called  tracks that they did not receive prior auth yet, advised I faxed on Friday, nothing was in their system, this was resent. And will need to recheck tomorrow or Wednesday. Patients surgery is on Wednesday.

## 2016-10-07 ENCOUNTER — Encounter (HOSPITAL_COMMUNITY): Payer: Self-pay | Admitting: *Deleted

## 2016-10-07 NOTE — Telephone Encounter (Signed)
I called and spoke with Toniann Fail with [place] [facility] advised me form was not to be completed with on narcotic analgesic form..It is suppose to be on Short acting opioid analgesic. Advised we refax on correct form. Explained to Toniann Fail I just did one and it was approved, they can no longer process forms. This was refaxed on correct form. [phone]

## 2016-10-07 NOTE — Progress Notes (Signed)
Pt has hx of non ischemic cardiomyopathy. Dr. Allyson Sabal is her cardiologist. She denies any recent chest pain or sob. Pt is diabetic. She's not sure when her last A1C was or what the result was. Pt states her fasting blood sugar usually is between 80-110. Instructed pt to take 1/2 of her regular dose of Lantus tonight ( will take 7 units). Instructed her to check her blood sugar in the AM and every 2 hours prior to leaving for the hospital. If blood sugar is >220 take 1/2 of usual correction dose of Novolog insulin. If blood sugar is 70 or below, treat with 1/2 cup of clear juice (apple or cranberry) and recheck blood sugar 15 minutes after drinking juice. If blood sugar continues to be 70 or below, call the Short Stay department and ask to speak to a nurse.

## 2016-10-08 ENCOUNTER — Inpatient Hospital Stay (HOSPITAL_COMMUNITY): Payer: Medicaid Other | Admitting: Certified Registered"

## 2016-10-08 ENCOUNTER — Encounter (HOSPITAL_COMMUNITY)
Admission: RE | Disposition: A | Discharge status: Skilled Nursing Facility | Payer: Self-pay | Source: Ambulatory Visit | Attending: Orthopedic Surgery

## 2016-10-08 ENCOUNTER — Inpatient Hospital Stay (HOSPITAL_COMMUNITY)
Admission: RE | Admit: 2016-10-08 | Discharge: 2016-10-13 | DRG: 239 | Disposition: A | Discharge status: Skilled Nursing Facility | Payer: Medicaid Other | Source: Ambulatory Visit | Attending: Orthopedic Surgery | Admitting: Orthopedic Surgery

## 2016-10-08 DIAGNOSIS — Z888 Allergy status to other drugs, medicaments and biological substances status: Secondary | ICD-10-CM

## 2016-10-08 DIAGNOSIS — E1152 Type 2 diabetes mellitus with diabetic peripheral angiopathy with gangrene: Secondary | ICD-10-CM | POA: Diagnosis not present

## 2016-10-08 DIAGNOSIS — N186 End stage renal disease: Secondary | ICD-10-CM | POA: Diagnosis present

## 2016-10-08 DIAGNOSIS — Z882 Allergy status to sulfonamides status: Secondary | ICD-10-CM | POA: Diagnosis not present

## 2016-10-08 DIAGNOSIS — E1040 Type 1 diabetes mellitus with diabetic neuropathy, unspecified: Secondary | ICD-10-CM | POA: Diagnosis present

## 2016-10-08 DIAGNOSIS — Z794 Long term (current) use of insulin: Secondary | ICD-10-CM

## 2016-10-08 DIAGNOSIS — M898X9 Other specified disorders of bone, unspecified site: Secondary | ICD-10-CM | POA: Diagnosis present

## 2016-10-08 DIAGNOSIS — E785 Hyperlipidemia, unspecified: Secondary | ICD-10-CM | POA: Diagnosis present

## 2016-10-08 DIAGNOSIS — R110 Nausea: Secondary | ICD-10-CM | POA: Diagnosis not present

## 2016-10-08 DIAGNOSIS — E1022 Type 1 diabetes mellitus with diabetic chronic kidney disease: Secondary | ICD-10-CM | POA: Diagnosis present

## 2016-10-08 DIAGNOSIS — D649 Anemia, unspecified: Secondary | ICD-10-CM | POA: Diagnosis present

## 2016-10-08 DIAGNOSIS — Z7951 Long term (current) use of inhaled steroids: Secondary | ICD-10-CM

## 2016-10-08 DIAGNOSIS — Z79899 Other long term (current) drug therapy: Secondary | ICD-10-CM

## 2016-10-08 DIAGNOSIS — I120 Hypertensive chronic kidney disease with stage 5 chronic kidney disease or end stage renal disease: Secondary | ICD-10-CM | POA: Diagnosis present

## 2016-10-08 DIAGNOSIS — Z886 Allergy status to analgesic agent status: Secondary | ICD-10-CM

## 2016-10-08 DIAGNOSIS — J449 Chronic obstructive pulmonary disease, unspecified: Secondary | ICD-10-CM | POA: Diagnosis present

## 2016-10-08 DIAGNOSIS — E1052 Type 1 diabetes mellitus with diabetic peripheral angiopathy with gangrene: Secondary | ICD-10-CM | POA: Diagnosis present

## 2016-10-08 DIAGNOSIS — I2510 Atherosclerotic heart disease of native coronary artery without angina pectoris: Secondary | ICD-10-CM | POA: Diagnosis present

## 2016-10-08 DIAGNOSIS — I252 Old myocardial infarction: Secondary | ICD-10-CM

## 2016-10-08 DIAGNOSIS — Z89431 Acquired absence of right foot: Secondary | ICD-10-CM

## 2016-10-08 DIAGNOSIS — N2581 Secondary hyperparathyroidism of renal origin: Secondary | ICD-10-CM | POA: Diagnosis present

## 2016-10-08 DIAGNOSIS — Z79891 Long term (current) use of opiate analgesic: Secondary | ICD-10-CM | POA: Diagnosis not present

## 2016-10-08 DIAGNOSIS — Z992 Dependence on renal dialysis: Secondary | ICD-10-CM

## 2016-10-08 DIAGNOSIS — I959 Hypotension, unspecified: Secondary | ICD-10-CM | POA: Diagnosis not present

## 2016-10-08 HISTORY — DX: Peripheral vascular disease, unspecified: I73.9

## 2016-10-08 HISTORY — DX: Other specified postprocedural states: R11.2

## 2016-10-08 HISTORY — DX: Nausea with vomiting, unspecified: Z98.890

## 2016-10-08 HISTORY — PX: AMPUTATION: SHX166

## 2016-10-08 HISTORY — DX: Restless legs syndrome: G25.81

## 2016-10-08 LAB — GLUCOSE, CAPILLARY
GLUCOSE-CAPILLARY: 88 mg/dL (ref 65–99)
GLUCOSE-CAPILLARY: 124 mg/dL — AB (ref 65–99)
Glucose-Capillary: 90 mg/dL (ref 65–99)
Glucose-Capillary: 98 mg/dL (ref 65–99)

## 2016-10-08 LAB — CBC
HCT: 32.1 % — ABNORMAL LOW (ref 36.0–46.0)
Hemoglobin: 10.2 g/dL — ABNORMAL LOW (ref 12.0–15.0)
MCH: 31.1 pg (ref 26.0–34.0)
MCHC: 31.8 g/dL (ref 30.0–36.0)
MCV: 97.9 fL (ref 78.0–100.0)
PLATELETS: 193 10*3/uL (ref 150–400)
RBC: 3.28 MIL/uL — AB (ref 3.87–5.11)
RDW: 18.6 % — AB (ref 11.5–15.5)
WBC: 5.4 10*3/uL (ref 4.0–10.5)

## 2016-10-08 LAB — MRSA PCR SCREENING: MRSA by PCR: NEGATIVE

## 2016-10-08 LAB — BASIC METABOLIC PANEL
Anion gap: 13 (ref 5–15)
BUN: 22 mg/dL — ABNORMAL HIGH (ref 6–20)
CALCIUM: 8.5 mg/dL — AB (ref 8.9–10.3)
CO2: 28 mmol/L (ref 22–32)
CREATININE: 4.63 mg/dL — AB (ref 0.44–1.00)
Chloride: 97 mmol/L — ABNORMAL LOW (ref 101–111)
GFR calc non Af Amer: 11 mL/min — ABNORMAL LOW (ref >60)
GFR, EST AFRICAN AMERICAN: 13 mL/min — AB (ref >60)
Glucose, Bld: 85 mg/dL (ref 65–99)
Potassium: 3.6 mmol/L (ref 3.5–5.1)
SODIUM: 138 mmol/L (ref 135–145)

## 2016-10-08 LAB — HCG, SERUM, QUALITATIVE: PREG SERUM: NEGATIVE

## 2016-10-08 SURGERY — AMPUTATION, FOOT, PARTIAL
Anesthesia: General | Site: Foot | Laterality: Bilateral

## 2016-10-08 MED ORDER — SODIUM CHLORIDE 0.9 % IV SOLN
INTRAVENOUS | Status: DC
  Administered 2016-10-08: 14:00:00 via INTRAVENOUS

## 2016-10-08 MED ORDER — METHOCARBAMOL 1000 MG/10ML IJ SOLN: 500.0000 mg | Freq: Four times a day (QID) | INTRAVENOUS | Status: DC | PRN

## 2016-10-08 MED ORDER — HYDROMORPHONE HCL 1 MG/ML IJ SOLN
INTRAMUSCULAR | Status: AC
  Administered 2016-10-08: 0.5 mg via INTRAVENOUS
  Filled 2016-10-08: qty 1

## 2016-10-08 MED ORDER — CALCITRIOL 0.5 MCG PO CAPS
0.5000 ug | ORAL_CAPSULE | ORAL | Status: DC
  Administered 2016-10-09 – 2016-10-11 (×3): 0.5 ug via ORAL
  Filled 2016-10-08: qty 1

## 2016-10-08 MED ORDER — ACETAMINOPHEN 325 MG PO TABS
650.0000 mg | ORAL_TABLET | Freq: Four times a day (QID) | ORAL | Status: DC | PRN
  Administered 2016-10-12: 650 mg via ORAL
  Filled 2016-10-08: qty 2

## 2016-10-08 MED ORDER — PROPOFOL 10 MG/ML IV BOLUS
INTRAVENOUS | Status: AC
  Filled 2016-10-08: qty 20

## 2016-10-08 MED ORDER — ALBUTEROL SULFATE (2.5 MG/3ML) 0.083% IN NEBU: 3.0000 mL | INHALATION_SOLUTION | Freq: Four times a day (QID) | RESPIRATORY_TRACT | Status: DC | PRN

## 2016-10-08 MED ORDER — ACETAMINOPHEN 650 MG RE SUPP: 650.0000 mg | Freq: Four times a day (QID) | RECTAL | Status: DC | PRN

## 2016-10-08 MED ORDER — FENTANYL CITRATE (PF) 100 MCG/2ML IJ SOLN
INTRAMUSCULAR | Status: AC
  Filled 2016-10-08: qty 2

## 2016-10-08 MED ORDER — ONDANSETRON HCL 4 MG/2ML IJ SOLN
INTRAMUSCULAR | Status: AC
  Filled 2016-10-08: qty 2

## 2016-10-08 MED ORDER — SODIUM CHLORIDE 0.9 % IV SOLN
INTRAVENOUS | Status: DC
  Administered 2016-10-08: 10 mL/h via INTRAVENOUS

## 2016-10-08 MED ORDER — SODIUM CHLORIDE 0.9 % IV SOLN
62.5000 mg | INTRAVENOUS | Status: DC
  Administered 2016-10-09 (×2): 62.5 mg via INTRAVENOUS
  Filled 2016-10-08 (×2): qty 5

## 2016-10-08 MED ORDER — METHOCARBAMOL 500 MG PO TABS
ORAL_TABLET | ORAL | Status: AC
  Filled 2016-10-08: qty 1

## 2016-10-08 MED ORDER — OXYCODONE HCL 5 MG PO TABS
ORAL_TABLET | ORAL | Status: AC
  Filled 2016-10-08: qty 2

## 2016-10-08 MED ORDER — ONDANSETRON HCL 4 MG/2ML IJ SOLN
4.0000 mg | Freq: Four times a day (QID) | INTRAMUSCULAR | Status: DC | PRN
  Administered 2016-10-08: 4 mg via INTRAVENOUS
  Filled 2016-10-08: qty 2

## 2016-10-08 MED ORDER — METHOCARBAMOL 500 MG PO TABS
500.0000 mg | ORAL_TABLET | Freq: Four times a day (QID) | ORAL | Status: DC | PRN
  Administered 2016-10-08 – 2016-10-11 (×6): 500 mg via ORAL
  Filled 2016-10-08 (×5): qty 1

## 2016-10-08 MED ORDER — CHLORHEXIDINE GLUCONATE 4 % EX LIQD: 60.0000 mL | Freq: Once | CUTANEOUS | Status: DC

## 2016-10-08 MED ORDER — ACETAMINOPHEN 10 MG/ML IV SOLN
1000.0000 mg | Freq: Once | INTRAVENOUS | Status: AC
  Administered 2016-10-08: 1000 mg via INTRAVENOUS

## 2016-10-08 MED ORDER — BUDESONIDE 0.25 MG/2ML IN SUSP: 0.2500 mg | Freq: Two times a day (BID) | RESPIRATORY_TRACT | Status: DC | PRN

## 2016-10-08 MED ORDER — TRANEXAMIC ACID 1000 MG/10ML IV SOLN
2000.0000 mg | Freq: Once | INTRAVENOUS | Status: DC
  Filled 2016-10-08: qty 20

## 2016-10-08 MED ORDER — HYDROMORPHONE HCL 1 MG/ML IJ SOLN
0.2500 mg | INTRAMUSCULAR | Status: DC | PRN
  Administered 2016-10-08 (×4): 0.5 mg via INTRAVENOUS

## 2016-10-08 MED ORDER — SEVELAMER CARBONATE 800 MG PO TABS
3200.0000 mg | ORAL_TABLET | Freq: Three times a day (TID) | ORAL | Status: DC
  Administered 2016-10-08 – 2016-10-13 (×10): 3200 mg via ORAL
  Filled 2016-10-08 (×10): qty 4

## 2016-10-08 MED ORDER — LIDOCAINE 2% (20 MG/ML) 5 ML SYRINGE
INTRAMUSCULAR | Status: AC
  Filled 2016-10-08: qty 5

## 2016-10-08 MED ORDER — TRANEXAMIC ACID 1000 MG/10ML IV SOLN
INTRAVENOUS | Status: DC | PRN
  Administered 2016-10-08: 2000 mg via TOPICAL

## 2016-10-08 MED ORDER — ATORVASTATIN CALCIUM 40 MG PO TABS
40.0000 mg | ORAL_TABLET | Freq: Every day | ORAL | Status: DC
  Administered 2016-10-08 – 2016-10-12 (×5): 40 mg via ORAL
  Filled 2016-10-08 (×5): qty 1

## 2016-10-08 MED ORDER — MIDAZOLAM HCL 2 MG/2ML IJ SOLN
INTRAMUSCULAR | Status: DC | PRN
  Administered 2016-10-08: 2 mg via INTRAVENOUS

## 2016-10-08 MED ORDER — TRANEXAMIC ACID 1000 MG/10ML IV SOLN
1000.0000 mg | INTRAVENOUS | Status: AC
  Administered 2016-10-08: 1000 mg via INTRAVENOUS
  Filled 2016-10-08: qty 10

## 2016-10-08 MED ORDER — PRO-STAT SUGAR FREE PO LIQD
30.0000 mL | Freq: Two times a day (BID) | ORAL | Status: DC
  Administered 2016-10-09 – 2016-10-13 (×2): 30 mL via ORAL
  Filled 2016-10-08 (×7): qty 30

## 2016-10-08 MED ORDER — OXYCODONE HCL 5 MG PO TABS
5.0000 mg | ORAL_TABLET | ORAL | Status: DC | PRN
  Administered 2016-10-08 – 2016-10-10 (×6): 10 mg via ORAL
  Administered 2016-10-10: 5 mg via ORAL
  Administered 2016-10-10 (×2): 10 mg via ORAL
  Administered 2016-10-11 – 2016-10-13 (×2): 5 mg via ORAL
  Filled 2016-10-08 (×4): qty 2
  Filled 2016-10-08 (×2): qty 1
  Filled 2016-10-08 (×3): qty 2
  Filled 2016-10-08: qty 1

## 2016-10-08 MED ORDER — INSULIN GLARGINE 100 UNIT/ML ~~LOC~~ SOLN
10.0000 [IU] | Freq: Every day | SUBCUTANEOUS | Status: DC
  Administered 2016-10-08 – 2016-10-12 (×4): 10 [IU] via SUBCUTANEOUS
  Filled 2016-10-08 (×6): qty 0.1

## 2016-10-08 MED ORDER — HYDROXYZINE HCL 25 MG PO TABS
25.0000 mg | ORAL_TABLET | Freq: Four times a day (QID) | ORAL | Status: DC
  Administered 2016-10-08 – 2016-10-13 (×14): 25 mg via ORAL
  Filled 2016-10-08 (×14): qty 1

## 2016-10-08 MED ORDER — FENTANYL CITRATE (PF) 100 MCG/2ML IJ SOLN
INTRAMUSCULAR | Status: AC
Start: 2016-10-08 — End: 2016-10-08
  Filled 2016-10-08: qty 2

## 2016-10-08 MED ORDER — DULOXETINE HCL 30 MG PO CPEP
30.0000 mg | ORAL_CAPSULE | Freq: Every day | ORAL | Status: DC
  Administered 2016-10-09 – 2016-10-13 (×4): 30 mg via ORAL
  Filled 2016-10-08 (×4): qty 1

## 2016-10-08 MED ORDER — CARVEDILOL 25 MG PO TABS
25.0000 mg | ORAL_TABLET | Freq: Two times a day (BID) | ORAL | Status: DC
  Administered 2016-10-08 – 2016-10-10 (×5): 25 mg via ORAL
  Filled 2016-10-08 (×5): qty 1

## 2016-10-08 MED ORDER — ONDANSETRON HCL 4 MG/2ML IJ SOLN
INTRAMUSCULAR | Status: DC | PRN
  Administered 2016-10-08: 4 mg via INTRAVENOUS

## 2016-10-08 MED ORDER — 0.9 % SODIUM CHLORIDE (POUR BTL) OPTIME
TOPICAL | Status: DC | PRN
  Administered 2016-10-08: 1000 mL

## 2016-10-08 MED ORDER — PROPOFOL 10 MG/ML IV BOLUS
INTRAVENOUS | Status: DC | PRN
  Administered 2016-10-08: 100 mg via INTRAVENOUS

## 2016-10-08 MED ORDER — METOCLOPRAMIDE HCL 5 MG/ML IJ SOLN
5.0000 mg | Freq: Three times a day (TID) | INTRAMUSCULAR | Status: DC | PRN
  Administered 2016-10-08: 10 mg via INTRAVENOUS
  Filled 2016-10-08: qty 2

## 2016-10-08 MED ORDER — ALBUTEROL SULFATE (2.5 MG/3ML) 0.083% IN NEBU: 2.5000 mg | INHALATION_SOLUTION | Freq: Four times a day (QID) | RESPIRATORY_TRACT | Status: DC | PRN

## 2016-10-08 MED ORDER — RENA-VITE PO TABS
1.0000 | ORAL_TABLET | Freq: Every day | ORAL | Status: DC
  Administered 2016-10-08 – 2016-10-12 (×5): 1 via ORAL
  Filled 2016-10-08 (×5): qty 1

## 2016-10-08 MED ORDER — ONDANSETRON HCL 4 MG PO TABS
4.0000 mg | ORAL_TABLET | Freq: Four times a day (QID) | ORAL | Status: DC | PRN
Start: 2016-10-08 — End: 2016-10-13

## 2016-10-08 MED ORDER — CLOPIDOGREL BISULFATE 75 MG PO TABS
75.0000 mg | ORAL_TABLET | Freq: Every day | ORAL | Status: DC
  Administered 2016-10-10 – 2016-10-13 (×4): 75 mg via ORAL
  Filled 2016-10-08 (×4): qty 1

## 2016-10-08 MED ORDER — METOCLOPRAMIDE HCL 5 MG PO TABS: 5.0000 mg | ORAL_TABLET | Freq: Three times a day (TID) | ORAL | Status: DC | PRN

## 2016-10-08 MED ORDER — HYDROMORPHONE HCL 1 MG/ML IJ SOLN
0.5000 mg | INTRAMUSCULAR | Status: AC | PRN
  Administered 2016-10-08 (×2): 0.5 mg via INTRAVENOUS

## 2016-10-08 MED ORDER — INSULIN ASPART 100 UNIT/ML ~~LOC~~ SOLN
0.0000 [IU] | Freq: Three times a day (TID) | SUBCUTANEOUS | Status: DC
Start: 2016-10-08 — End: 2016-10-13
  Administered 2016-10-10 – 2016-10-13 (×3): 1 [IU] via SUBCUTANEOUS

## 2016-10-08 MED ORDER — FENTANYL CITRATE (PF) 100 MCG/2ML IJ SOLN
INTRAMUSCULAR | Status: DC | PRN
  Administered 2016-10-08: 100 ug via INTRAVENOUS
  Administered 2016-10-08 (×3): 50 ug via INTRAVENOUS
  Administered 2016-10-08: 100 ug via INTRAVENOUS
  Administered 2016-10-08: 50 ug via INTRAVENOUS

## 2016-10-08 MED ORDER — CEFAZOLIN SODIUM-DEXTROSE 2-4 GM/100ML-% IV SOLN
2.0000 g | Freq: Two times a day (BID) | INTRAVENOUS | Status: AC
  Administered 2016-10-08 – 2016-10-09 (×2): 2 g via INTRAVENOUS
  Filled 2016-10-08 (×2): qty 100

## 2016-10-08 MED ORDER — PREGABALIN 50 MG PO CAPS
50.0000 mg | ORAL_CAPSULE | Freq: Three times a day (TID) | ORAL | Status: DC
  Administered 2016-10-08 – 2016-10-13 (×11): 50 mg via ORAL
  Filled 2016-10-08 (×12): qty 1

## 2016-10-08 MED ORDER — ISOSORBIDE DINITRATE 20 MG PO TABS
20.0000 mg | ORAL_TABLET | Freq: Three times a day (TID) | ORAL | Status: DC
  Administered 2016-10-08 – 2016-10-10 (×7): 20 mg via ORAL
  Filled 2016-10-08 (×9): qty 1

## 2016-10-08 MED ORDER — CEFAZOLIN SODIUM-DEXTROSE 2-4 GM/100ML-% IV SOLN
INTRAVENOUS | Status: AC
  Filled 2016-10-08: qty 100

## 2016-10-08 MED ORDER — ACETAMINOPHEN 10 MG/ML IV SOLN
INTRAVENOUS | Status: AC
  Administered 2016-10-08: 1000 mg via INTRAVENOUS
  Filled 2016-10-08: qty 100

## 2016-10-08 MED ORDER — HYDROMORPHONE HCL 2 MG/ML IJ SOLN
1.0000 mg | INTRAMUSCULAR | Status: DC | PRN
  Administered 2016-10-08 – 2016-10-09 (×3): 1 mg via INTRAVENOUS
  Filled 2016-10-08 (×3): qty 1

## 2016-10-08 MED ORDER — LIDOCAINE 2% (20 MG/ML) 5 ML SYRINGE
INTRAMUSCULAR | Status: DC | PRN
  Administered 2016-10-08: 60 mg via INTRAVENOUS

## 2016-10-08 MED ORDER — DARBEPOETIN ALFA 200 MCG/0.4ML IJ SOSY
200.0000 ug | PREFILLED_SYRINGE | INTRAMUSCULAR | Status: DC
  Administered 2016-10-09: 200 ug via INTRAVENOUS

## 2016-10-08 MED ORDER — CEFAZOLIN SODIUM-DEXTROSE 2-4 GM/100ML-% IV SOLN
2.0000 g | INTRAVENOUS | Status: AC
  Administered 2016-10-08: 2 g via INTRAVENOUS

## 2016-10-08 MED ORDER — MIDAZOLAM HCL 2 MG/2ML IJ SOLN
INTRAMUSCULAR | Status: AC
  Filled 2016-10-08: qty 2

## 2016-10-08 MED ORDER — INSULIN ASPART 100 UNIT/ML ~~LOC~~ SOLN
3.0000 [IU] | Freq: Three times a day (TID) | SUBCUTANEOUS | Status: DC
  Administered 2016-10-09 – 2016-10-13 (×6): 3 [IU] via SUBCUTANEOUS

## 2016-10-08 SURGICAL SUPPLY — 37 items
BAG DECANTER FOR FLEXI CONT (MISCELLANEOUS) ×3 IMPLANT
BENZOIN TINCTURE PRP APPL 2/3 (GAUZE/BANDAGES/DRESSINGS) ×3 IMPLANT
BLADE SAW SGTL HD 18.5X60.5X1. (BLADE) ×3 IMPLANT
BLADE SURG 21 STRL SS (BLADE) ×6 IMPLANT
BNDG COHESIVE 4X5 TAN STRL (GAUZE/BANDAGES/DRESSINGS) IMPLANT
BNDG GAUZE ELAST 4 BULKY (GAUZE/BANDAGES/DRESSINGS) IMPLANT
COVER SURGICAL LIGHT HANDLE (MISCELLANEOUS) ×3 IMPLANT
DRAPE INCISE IOBAN 66X45 STRL (DRAPES) ×15 IMPLANT
DRAPE U-SHAPE 47X51 STRL (DRAPES) ×3 IMPLANT
DRSG ADAPTIC 3X8 NADH LF (GAUZE/BANDAGES/DRESSINGS) IMPLANT
DRSG PAD ABDOMINAL 8X10 ST (GAUZE/BANDAGES/DRESSINGS) IMPLANT
DURAPREP 26ML APPLICATOR (WOUND CARE) ×3 IMPLANT
ELECT REM PT RETURN 9FT ADLT (ELECTROSURGICAL) ×3
ELECTRODE REM PT RTRN 9FT ADLT (ELECTROSURGICAL) ×1 IMPLANT
GAUZE SPONGE 4X4 12PLY STRL (GAUZE/BANDAGES/DRESSINGS) IMPLANT
GLOVE BIOGEL PI IND STRL 9 (GLOVE) ×1 IMPLANT
GLOVE BIOGEL PI INDICATOR 9 (GLOVE) ×2
GLOVE SURG ORTHO 9.0 STRL STRW (GLOVE) ×3 IMPLANT
GOWN STRL REUS W/ TWL XL LVL3 (GOWN DISPOSABLE) ×3 IMPLANT
GOWN STRL REUS W/TWL XL LVL3 (GOWN DISPOSABLE) ×6
KIT BASIN OR (CUSTOM PROCEDURE TRAY) ×3 IMPLANT
KIT ROOM TURNOVER OR (KITS) ×3 IMPLANT
NEEDLE SPNL 18GX3.5 QUINCKE PK (NEEDLE) ×3 IMPLANT
NS IRRIG 1000ML POUR BTL (IV SOLUTION) ×3 IMPLANT
PACK ORTHO EXTREMITY (CUSTOM PROCEDURE TRAY) ×3 IMPLANT
PAD ARMBOARD 7.5X6 YLW CONV (MISCELLANEOUS) ×6 IMPLANT
PREVENA INCISION MGT 90 150 (MISCELLANEOUS) ×3 IMPLANT
SPONGE LAP 18X18 X RAY DECT (DISPOSABLE) IMPLANT
STOCKINETTE IMPERVIOUS 9X36 MD (GAUZE/BANDAGES/DRESSINGS) ×3 IMPLANT
SUT ETHILON 2 0 PSLX (SUTURE) ×9 IMPLANT
SUT VIC AB 2-0 CTB1 (SUTURE) IMPLANT
TOWEL OR 17X24 6PK STRL BLUE (TOWEL DISPOSABLE) ×3 IMPLANT
TOWEL OR 17X26 10 PK STRL BLUE (TOWEL DISPOSABLE) ×3 IMPLANT
TUBE CONNECTING 12'X1/4 (SUCTIONS) ×1
TUBE CONNECTING 12X1/4 (SUCTIONS) ×2 IMPLANT
WATER STERILE IRR 1000ML POUR (IV SOLUTION) ×3 IMPLANT
YANKAUER SUCT BULB TIP NO VENT (SUCTIONS) ×3 IMPLANT

## 2016-10-08 NOTE — Consult Note (Signed)
[facility] Renal Consultation Note    Indication for Consultation:  Management of ESRD/hemodialysis; anemia, hypertension/volume and secondary hyperparathyroidism  HPI: [name] is a 36 y.o. female with ESRD on hemodialysis TTS at [facility]. PMH also includes DM with neuropathy,  HTN, severe PVD. 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 Pt with bilateral ischemic foot wounds, poorly healing.  She underwent bilateral transmetatarsal amputation by Dr. [name] this am.  Seen at bedside currently. Feeling a little nauseated following surgery with foot pain, but not acute distress, seems relatively comfortable. Denies fever, chills, chest pain, dyspnea, abdominal pain.  Last HD was 10/07/16. She has been complaint with HD treatments of late and meeting EDW   Past Medical History:  Diagnosis Date  . Anemia   . Arthritis    "left hand" (09/15/2013)  . Asthma   . Chronic bronchitis (HCC)    "just about q yr" (09/15/2013)  . Chronic kidney disease    "low kidney function" (09/15/2013) ,  T/Th/Sa  . COPD (chronic obstructive pulmonary disease) (HCC)   . Coronary artery disease   . Hyperlipidemia   . Hypertension   . Migraine    "get them alot" (09/15/2013)  . Myocardial infarction 04/2015   NSTEMI  . Normal coronary arteries    by cardiac catheterization 09/20/13  . Peripheral vascular disease (HCC)   . Pneumonia    "couple times; have it now" (09/15/2013)  . PONV (postoperative nausea and vomiting)   . Restless legs   . Shortness of breath    "just recently; related to the pneumonia" (09/15/2013)  . Type 1 diabetes mellitus (HCC)    type 2   Past Surgical History:  Procedure Laterality Date  . AMPUTATION Right 06/25/2016   Procedure: Amputation Right Great Toe at the Metatarsophalangeal Joint;  Surgeon: Nadara Mustard, MD;  Location: [facility] OR;  Service: Orthopedics;  Laterality: Right;  . AV FISTULA PLACEMENT Left  03/27/2015   Procedure: CREATION RADIAL CEPHALIC ARTERIOVENOUS FISTULA;  Surgeon: Chuck Hint, MD;  Location: [facility] OR;  Service: Vascular;  Laterality: Left;  . AV FISTULA PLACEMENT Left 11/23/2015   Procedure:  LEFT ARM BASILIC VEIN TRANSPOSITION  ;  Surgeon: Chuck Hint, MD;  Location: [facility] OR;  Service: Vascular;  Laterality: Left;  . CESAREAN SECTION  1999; 2006  . CORONARY ANGIOGRAM  09/20/2013   Procedure: CORONARY ANGIOGRAM;  Surgeon: Runell Gess, MD;  Location: [facility] CATH LAB;  Service: Cardiovascular;;  . FINGER SURGERY Left 1985   3rd and 4th digits reconstructed after cut off" (09/15/2013)  . PERIPHERAL VASCULAR CATHETERIZATION N/A 09/05/2016   Procedure: Abdominal Aortogram;  Surgeon: Sherren Kerns, MD;  Location: [facility] INVASIVE CV LAB;  Service: Cardiovascular;  Laterality: N/A;  . PERIPHERAL VASCULAR CATHETERIZATION Bilateral 09/05/2016   Procedure: Lower Extremity Angiography;  Surgeon: Sherren Kerns, MD;  Location: [facility] INVASIVE CV LAB;  Service: Cardiovascular;  Laterality: Bilateral;  . PERIPHERAL VASCULAR CATHETERIZATION Right 09/05/2016   Procedure: Peripheral Vascular Balloon Angioplasty;  Surgeon: Sherren Kerns, MD;  Location: [facility] INVASIVE CV LAB;  Service: Cardiovascular;  Laterality: Right;  peroneal and AT  . SHOULDER ARTHROSCOPY WITH BICEPSTENOTOMY Right 05/10/2015   Procedure: RIGHT SHOULDER ARTHROSCOPY WITH BICEPS TENOTOMY, DEBRIDEMENT LABRAL TEAR;  Surgeon: Jones Broom, MD;  Location: [facility] OR;  Service: Orthopedics;  Laterality: Right;  Right shoulder arthroscopy biceps tenotomy, debridement labral tear  . TONSILLECTOMY  1997  . TUBAL LIGATION  2006  Family History  Problem Relation Age of Onset  . Diabetes Mother   . Stroke Mother   . Hypertension Father   . Hyperlipidemia Father   . Cancer - Lung Father   . Diabetes Maternal Grandmother   . Cancer Paternal Grandmother    Social History:  reports that she has never smoked. She has never  used smokeless tobacco. She reports that she does not drink alcohol or use drugs. Allergies  Allergen Reactions  . Aspirin Anaphylaxis  . Sulfur Hives  . Tramadol Hives and Other (See Comments)    Pt states she feels weird    Prior to Admission medications   Medication Sig Start Date End Date Taking? Authorizing Provider  atorvastatin (LIPITOR) 40 MG tablet Take 40 mg by mouth at bedtime.   Yes Historical Provider, MD  carvedilol (COREG) 25 MG tablet Take 25 mg by mouth 2 (two) times daily.   Yes Historical Provider, MD  ciprofloxacin (CIPRO) 500 MG tablet Take 1 tablet (500 mg total) by mouth at bedtime. 09/03/16  Yes Cliffton Asters, MD  clindamycin (CLEOCIN) 150 MG capsule Take 2 capsules (300 mg total) by mouth 4 (four) times daily. 09/21/16  Yes Lavera Guise, MD  clopidogrel (PLAVIX) 75 MG tablet Take 1 tablet (75 mg total) by mouth daily. 09/05/16  Yes Sherren Kerns, MD  diphenhydrAMINE (BENADRYL) 25 MG tablet Take 25 mg by mouth every 6 (six) hours as needed for itching.   Yes Historical Provider, MD  DULoxetine (CYMBALTA) 30 MG capsule Take 1 capsule (30 mg total) by mouth daily. Qam 07/21/16  Yes Nadara Mustard, MD  hydrOXYzine (ATARAX/VISTARIL) 25 MG tablet Take 1 tablet (25 mg total) by mouth every 6 (six) hours. 03/30/16  Yes Rolland Porter, MD  insulin aspart (NOVOLOG FLEXPEN) 100 UNIT/ML FlexPen Inject 2-15 Units into the skin 3 (three) times daily with meals. Per sliding scale - based on carb count and CBG    Yes Historical Provider, MD  Insulin Glargine (LANTUS SOLOSTAR) 100 UNIT/ML Solostar Pen Inject 10 Units into the skin at bedtime. Patient taking differently: Inject 14 Units into the skin at bedtime.  06/26/16  Yes Richarda Overlie, MD  isosorbide dinitrate (ISORDIL) 20 MG tablet Take 20 mg by mouth 3 (three) times daily.   Yes Historical Provider, MD  metroNIDAZOLE (FLAGYL) 500 MG tablet Take 1 tablet (500 mg total) by mouth 2 (two) times daily. 09/03/16  Yes Cliffton Asters, MD   nitroGLYCERIN (NITRODUR - DOSED IN MG/24 HR) 0.2 mg/hr patch Place 1 patch (0.2 mg total) onto the skin daily. 07/22/16  Yes Nadara Mustard, MD  oxyCODONE-acetaminophen (PERCOCET/ROXICET) 5-325 MG tablet Take 2 tablets by mouth every 8 (eight) hours as needed for severe pain. 09/28/16  Yes Hannah Muthersbaugh, PA-C  pregabalin (LYRICA) 50 MG capsule Take 1 capsule (50 mg total) by mouth 3 (three) times daily. 07/21/16  Yes Adonis Huguenin, NP  sevelamer carbonate (RENVELA) 800 MG tablet Take 3,200 mg by mouth 3 (three) times daily with meals.   Yes Historical Provider, MD  silver sulfADIAZINE (SILVADENE) 1 % cream Apply 1 application topically daily. Apply to affected area daily plus dry dressing 08/25/16  Yes Nadara Mustard, MD  albuterol (PROVENTIL HFA;VENTOLIN HFA) 108 (90 BASE) MCG/ACT inhaler Inhale 2 puffs into the lungs every 6 (six) hours as needed for wheezing or shortness of breath.     Historical Provider, MD  albuterol (PROVENTIL) (2.5 MG/3ML) 0.083% nebulizer solution Take 2.5 mg by nebulization  every 6 (six) hours as needed for wheezing or shortness of breath.    Historical Provider, MD  budesonide (PULMICORT) 0.25 MG/2ML nebulizer solution Take 0.25 mg by nebulization 2 (two) times daily as needed (shortness of breath or wheezing).    Historical Provider, MD  nystatin (MYCOSTATIN/NYSTOP) powder Apply topically 4 (four) times daily. To each groin area Patient not taking: Reported on 10/03/2016 09/05/16   Sherren Kerns, MD  oxyCODONE (ROXICODONE) 5 MG immediate release tablet Take 1 tablet (5 mg total) by mouth every 6 (six) hours as needed for breakthrough pain. Patient not taking: Reported on 10/03/2016 09/28/16   Dahlia Client Muthersbaugh, PA-C  traMADol (ULTRAM) 50 MG tablet Take 1 tablet (50 mg total) by mouth every 6 (six) hours as needed. 10/03/16   Tarry Kos, MD   Current Facility-Administered Medications  Medication Dose Route Frequency Provider Last Rate Last Dose  . 0.9 %  sodium  chloride infusion   Intravenous Continuous Nadara Mustard, MD      . acetaminophen (TYLENOL) tablet 650 mg  650 mg Oral Q6H PRN Nadara Mustard, MD       Or  . acetaminophen (TYLENOL) suppository 650 mg  650 mg Rectal Q6H PRN Nadara Mustard, MD      . albuterol (PROVENTIL) (2.5 MG/3ML) 0.083% nebulizer solution 3 mL  3 mL Inhalation Q6H PRN Nadara Mustard, MD      . atorvastatin (LIPITOR) tablet 40 mg  40 mg Oral QHS Nadara Mustard, MD      . budesonide (PULMICORT) nebulizer solution 0.25 mg  0.25 mg Nebulization BID PRN Nadara Mustard, MD      . carvedilol (COREG) tablet 25 mg  25 mg Oral BID Nadara Mustard, MD      . Melene Muller ON 10/09/2016] ceFAZolin (ANCEF) IVPB 2g/100 mL premix  2 g Intravenous Q12H Nadara Mustard, MD      . clopidogrel (PLAVIX) tablet 75 mg  75 mg Oral Daily Nadara Mustard, MD      . Melene Muller ON 10/09/2016] DULoxetine (CYMBALTA) DR capsule 30 mg  30 mg Oral Daily Nadara Mustard, MD      . HYDROmorphone (DILAUDID) injection 1 mg  1 mg Intravenous Q2H PRN Nadara Mustard, MD      . hydrOXYzine (ATARAX/VISTARIL) tablet 25 mg  25 mg Oral Q6H Nadara Mustard, MD      . insulin aspart (novoLOG) injection 0-9 Units  0-9 Units Subcutaneous TID WC Nadara Mustard, MD      . insulin aspart (novoLOG) injection 3 Units  3 Units Subcutaneous TID WC Nadara Mustard, MD      . insulin glargine (LANTUS) injection 10 Units  10 Units Subcutaneous QHS Nadara Mustard, MD      . isosorbide dinitrate (ISORDIL) tablet 20 mg  20 mg Oral TID Nadara Mustard, MD      . methocarbamol (ROBAXIN) tablet 500 mg  500 mg Oral Q6H PRN Nadara Mustard, MD   500 mg at 10/08/16 1324   Or  . methocarbamol (ROBAXIN) 500 mg in dextrose 5 % 50 mL IVPB  500 mg Intravenous Q6H PRN Nadara Mustard, MD      . methocarbamol (ROBAXIN) 500 MG tablet           . metoCLOPramide (REGLAN) tablet 5-10 mg  5-10 mg Oral Q8H PRN Nadara Mustard, MD       Or  . metoCLOPramide (REGLAN) injection  5-10 mg  5-10 mg Intravenous Q8H PRN Nadara Mustard, MD   10 mg at  10/08/16 1533  . ondansetron (ZOFRAN) tablet 4 mg  4 mg Oral Q6H PRN Nadara Mustard, MD       Or  . ondansetron [facility]) injection 4 mg  4 mg Intravenous Q6H PRN Nadara Mustard, MD   4 mg at 10/08/16 1500  . oxyCODONE (Oxy IR/ROXICODONE) 5 MG immediate release tablet           . oxyCODONE (Oxy IR/ROXICODONE) immediate release tablet 5-10 mg  5-10 mg Oral Q4H PRN Nadara Mustard, MD   10 mg at 10/08/16 1324  . pregabalin (LYRICA) capsule 50 mg  50 mg Oral TID Nadara Mustard, MD      . sevelamer carbonate (RENVELA) tablet 3,200 mg  3,200 mg Oral TID WC Nadara Mustard, MD       Labs: Basic Metabolic Panel:  Recent Labs Lab 10/08/16 1033  NA 138  K 3.6  CL 97*  CO2 28  GLUCOSE 85  BUN 22*  CREATININE 4.63*  CALCIUM 8.5*   Liver Function Tests: No results for input(s): AST, ALT, ALKPHOS, BILITOT, PROT, ALBUMIN in the last 168 hours. No results for input(s): LIPASE, AMYLASE in the last 168 hours. No results for input(s): AMMONIA in the last 168 hours. CBC:  Recent Labs Lab 10/08/16 1033  WBC 5.4  HGB 10.2*  HCT 32.1*  MCV 97.9  PLT 193   Cardiac Enzymes: No results for input(s): CKTOTAL, CKMB, CKMBINDEX, TROPONINI in the last 168 hours. CBG:  Recent Labs Lab 10/08/16 1027 10/08/16 1222  GLUCAP 90 88   Iron Studies: No results for input(s): IRON, TIBC, TRANSFERRIN, FERRITIN in the last 72 hours. Studies/Results: No results found.  ROS: As per HPI otherwise negative.  Physical Exam: Vitals:   10/08/16 1319 10/08/16 1334 10/08/16 1412 10/08/16 1500  BP: (!) 147/89 (!) 149/98 (!) 144/93 (!) 141/81  Pulse: 81 78 81 86  Resp: 17 15 14 16   Temp:   97.5 F (36.4 C) 97.7 F (36.5 C)  TempSrc:   Oral Oral  SpO2: 100% 100% 99% 90%     General: WDWN AAF NAD Head: NCAT sclera not icteric MMM Neck: Supple.  Lungs: CTA bilaterally without wheezes, rales, or rhonchi. Breathing is unlabored. Heart: RRR with S1 S2.  Abdomen: soft NT + BS Lower extremities: bilat transmet  amputation dressed with wound VAC Neuro: A & O  X 3. Moves all extremities spontaneously. Psych:  Responds to questions appropriately with a normal affect. Dialysis Access: LUE AVF +thrill/bruit   Dialysis Orders:  [facility] TTS 4 h 15 mins 200F BFR 500/800 2K/2Ca EDW 120 kg Heparin bolus 3600 U IV q HD Calcitriol 0.5 mcg PO 3x q week  Aranesp 200 mcg IV q week (last 1/4) Venofer 50 mg IV q week (last 1/4) OP Labs: Hgb 9.9 Tsat 28% Ca 9.2 Ph 7.1 PTH 193   Assessment/Plan: 1.  Bilat foot gangrene  S/p bilateral transmetatarsal amputation this am  - per admit  2.  ESRD -  TTS Cont on schedule - K+ 3.6 Use 4K bath  3.  Hypertension/volume  -  BP slightly elevated, home meds Coreg/Isordil / no volume excess by exam HD tomorrow UF to EDW  4.  Anemia  - Hgb 10.2 Cont OP ESA/Fe 5.  Metabolic bone disease -  Cont VDRA/binders - follow P with renal panel  6.  Nutrition - renal  diet/vitamins/protein supp  7. DM - per primary   Tomasa Blase PA-C [facility] Pager [phone] 10/08/2016, 4:10 PM   Pt seen, examined and agree w A/P as above.  Vinson Moselle MD [employer] pager [phone]   10/09/2016, 7:33 AM

## 2016-10-08 NOTE — Anesthesia Postprocedure Evaluation (Signed)
Anesthesia Post Note  Patient: [name]  Procedure(s) Performed: Procedure(s) (LRB): Bilateral Transmetatarsal Amputation (Bilateral)  Patient location during evaluation: PACU Anesthesia Type: General Level of consciousness: awake and alert Pain management: pain level controlled Vital Signs Assessment: post-procedure vital signs reviewed and stable Respiratory status: spontaneous breathing, nonlabored ventilation and respiratory function stable Cardiovascular status: blood pressure returned to baseline and stable Postop Assessment: no signs of nausea or vomiting Anesthetic complications: no       Last Vitals:  Vitals:   10/08/16 1319 10/08/16 1334  BP: (!) 147/89 (!) 149/98  Pulse: 81 78  Resp: 17 15  Temp:      Last Pain:  Vitals:   10/08/16 1320  PainSc: 10-Worst pain ever                 Genever Hentges,[name]

## 2016-10-08 NOTE — Telephone Encounter (Signed)
I called [place] [facility] today and medication was approved, approval number [id number]

## 2016-10-08 NOTE — Anesthesia Preprocedure Evaluation (Addendum)
Anesthesia Evaluation  Patient identified by MRN, date of birth, ID band Patient awake    Reviewed: Allergy & Precautions, H&P , NPO status , Patient's Chart, lab work & pertinent test results, reviewed documented beta blocker date and time   History of Anesthesia Complications (+) PONV  Airway Mallampati: II  TM Distance: >3 FB Neck ROM: Full    Dental no notable dental hx. (+) Teeth Intact, Dental Advisory Given   Pulmonary asthma , COPD,  COPD inhaler,    Pulmonary exam normal breath sounds clear to auscultation       Cardiovascular hypertension, Pt. on medications and Pt. on home beta blockers + CAD, + Past MI and + Peripheral Vascular Disease  negative cardio ROS   Rhythm:Regular Rate:Normal     Neuro/Psych  Headaches, negative neurological ROS  negative psych ROS   GI/Hepatic negative GI ROS, Neg liver ROS,   Endo/Other  diabetes  Renal/GU ESRF and DialysisRenal disease  negative genitourinary   Musculoskeletal  (+) Arthritis , Osteoarthritis,    Abdominal   Peds  Hematology negative hematology ROS (+) anemia ,   Anesthesia Other Findings   Reproductive/Obstetrics negative OB ROS                            Anesthesia Physical Anesthesia Plan  ASA: III  Anesthesia Plan: General   Post-op Pain Management:    Induction: Intravenous  Airway Management Planned: LMA  Additional Equipment:   Intra-op Plan:   Post-operative Plan: Extubation in OR  Informed Consent: I have reviewed the patients History and Physical, chart, labs and discussed the procedure including the risks, benefits and alternatives for the proposed anesthesia with the patient or authorized representative who has indicated his/her understanding and acceptance.   Dental advisory given  Plan Discussed with: CRNA  Anesthesia Plan Comments:         Anesthesia Quick Evaluation

## 2016-10-08 NOTE — Op Note (Signed)
     Date of Surgery: 10/08/2016  INDICATIONS: Ms. [name] is a 36 y.o.-year-old female who has severe peripheral microsurgery tori vascular disease with gangrene of the forefoot bilaterally.Marland Kitchen  PREOPERATIVE DIAGNOSIS: Diabetic insensate neuropathy with peripheral vascular disease end stage renal disease on dialysis with bilateral forefoot gangrene.  POSTOPERATIVE DIAGNOSIS: Same.  PROCEDURE: Transmetatarsal amputation bilateral Application of Prevena wound VAC bilateral  SURGEON: [name], M.D.  ANESTHESIA:  general  IV FLUIDS AND URINE: See anesthesia.  ESTIMATED BLOOD LOSS: Minimal mL.  COMPLICATIONS: None.  DESCRIPTION OF PROCEDURE: The patient was brought to the operating room and underwent a general anesthetic. After adequate levels of anesthesia were obtained patient's bilateral lower extremity were prepped using DuraPrep draped into a sterile field. A timeout was called.  A fishmouth incision was made just proximal to the ulcerative nonviable tissue. This was carried sharply down to bone. A oscillating saw was used to perform a transmetatarsal amputation with a gentle cascade of the metatarsals and beveled plantarly. Electrocautery was used for hemostasis. The wound was irrigated with normal saline. The incision was closed using 2-0 nylon. A Prevena wound VAC was applied. This had a good suction fit. Patient was taken to the PACU in stable condition. Patient underwent the same procedure on both the left and right foot. The wounds were injected with a total of 2 g of topical TXA for 1 g on each foot. Patient received 1 g IV TXA preoperatively.  Aldean Baker, MD [facility] 12:24 PM

## 2016-10-08 NOTE — Anesthesia Procedure Notes (Signed)
Procedure Name: LMA Insertion Date/Time: 10/08/2016 11:38 AM Performed by: Lucinda Dell Pre-anesthesia Checklist: Patient identified, Emergency Drugs available, Suction available and Patient being monitored Patient Re-evaluated:Patient Re-evaluated prior to inductionOxygen Delivery Method: Circle system utilized Preoxygenation: Pre-oxygenation with 100% oxygen Intubation Type: IV induction Ventilation: Mask ventilation without difficulty LMA: LMA inserted LMA Size: 4.0 Tube type: Oral Number of attempts: 1 Placement Confirmation: positive ETCO2 and breath sounds checked- equal and bilateral Tube secured with: Tape Dental Injury: Teeth and Oropharynx as per pre-operative assessment

## 2016-10-08 NOTE — Transfer of Care (Signed)
Immediate Anesthesia Transfer of Care Note  Patient: [name]  Procedure(s) Performed: Procedure(s): Bilateral Transmetatarsal Amputation (Bilateral)  Patient Location: PACU  Anesthesia Type:General  Level of Consciousness: awake, alert , oriented and patient cooperative  Airway & Oxygen Therapy: Patient Spontanous Breathing and Patient connected to nasal cannula oxygen  Post-op Assessment: Report given to RN, Post -op Vital signs reviewed and stable and Patient moving all extremities  Post vital signs: Reviewed and stable  Last Vitals: There were no vitals filed for this visit.  Last Pain:  Vitals:   10/08/16 1041  PainSc: 8       Patients Stated Pain Goal: 3 (10/08/16 1041)  Complications: No apparent anesthesia complications

## 2016-10-08 NOTE — H&P (Signed)
[name] is an 36 y.o. female.   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  Past Medical History:  Diagnosis Date  . Anemia   . Arthritis    "left hand" (09/15/2013)  . Asthma   . Chronic bronchitis (HCC)    "just about q yr" (09/15/2013)  . Chronic kidney disease    "low kidney function" (09/15/2013) ,  T/Th/Sa  . COPD (chronic obstructive pulmonary disease) (HCC)   . Coronary artery disease   . Hyperlipidemia   . Hypertension   . Migraine    "get them alot" (09/15/2013)  . Myocardial infarction 04/2015   NSTEMI  . Normal coronary arteries    by cardiac catheterization 09/20/13  . Peripheral vascular disease (HCC)   . Pneumonia    "couple times; have it now" (09/15/2013)  . PONV (postoperative nausea and vomiting)   . Restless legs   . Shortness of breath    "just recently; related to the pneumonia" (09/15/2013)  . Type 1 diabetes mellitus (HCC)    type 2    Past Surgical History:  Procedure Laterality Date  . AMPUTATION Right 06/25/2016   Procedure: Amputation Right Great Toe at the Metatarsophalangeal Joint;  Surgeon: Nadara Mustard, MD;  Location: [facility] OR;  Service: Orthopedics;  Laterality: Right;  . AV FISTULA PLACEMENT Left 03/27/2015   Procedure: CREATION RADIAL CEPHALIC ARTERIOVENOUS FISTULA;  Surgeon: Chuck Hint, MD;  Location: [facility] OR;  Service: Vascular;  Laterality: Left;  . AV FISTULA PLACEMENT Left 11/23/2015   Procedure:  LEFT ARM BASILIC VEIN TRANSPOSITION  ;  Surgeon: Chuck Hint, MD;  Location: [facility] OR;  Service: Vascular;  Laterality: Left;  . CESAREAN SECTION  1999; 2006  . CORONARY ANGIOGRAM  09/20/2013   Procedure: CORONARY ANGIOGRAM;  Surgeon: Runell Gess, MD;  Location: [facility] CATH LAB;   Service: Cardiovascular;;  . FINGER SURGERY Left 1985   3rd and 4th digits reconstructed after cut off" (09/15/2013)  . PERIPHERAL VASCULAR CATHETERIZATION N/A 09/05/2016   Procedure: Abdominal Aortogram;  Surgeon: Sherren Kerns, MD;  Location: [facility] INVASIVE CV LAB;  Service: Cardiovascular;  Laterality: N/A;  . PERIPHERAL VASCULAR CATHETERIZATION Bilateral 09/05/2016   Procedure: Lower Extremity Angiography;  Surgeon: Sherren Kerns, MD;  Location: [facility] INVASIVE CV LAB;  Service: Cardiovascular;  Laterality: Bilateral;  . PERIPHERAL VASCULAR CATHETERIZATION Right 09/05/2016   Procedure: Peripheral Vascular Balloon Angioplasty;  Surgeon: Sherren Kerns, MD;  Location: [facility] INVASIVE CV LAB;  Service: Cardiovascular;  Laterality: Right;  peroneal and AT  . SHOULDER ARTHROSCOPY WITH BICEPSTENOTOMY Right 05/10/2015   Procedure: RIGHT SHOULDER ARTHROSCOPY WITH BICEPS TENOTOMY, DEBRIDEMENT LABRAL TEAR;  Surgeon: Jones Broom, MD;  Location: [facility] OR;  Service: Orthopedics;  Laterality: Right;  Right shoulder arthroscopy biceps tenotomy, debridement labral tear  . TONSILLECTOMY  1997  . TUBAL LIGATION  2006    Family History  Problem Relation Age of Onset  . Diabetes Mother   . Stroke Mother   . Hypertension Father   . Hyperlipidemia Father   . Cancer - Lung Father   . Diabetes Maternal Grandmother   . Cancer Paternal Grandmother    Social History:  reports that she has never smoked. She has never used smokeless tobacco. She reports that she does not drink alcohol or use drugs.  Allergies:  Allergies  Allergen Reactions  . Aspirin Anaphylaxis  . Sulfur Hives  . Tramadol Hives and Other (See Comments)    Pt states she feels weird     No prescriptions prior to admission.    No results found for this or any previous visit (from the past 48 hour(s)). No results found.  ROS  Last menstrual period 08/29/2016. Physical Exam  Examination patient is alert point no adenopathy well-dressed  normal affect cholesterol effort. Patient has good circulation to the ankle however she has painful gangrenous changes of the forefoot bilaterally due to microcirculatory disease there is no ascending cellulitis. Assessment/Plan Assessment: Diabetic insensate neuropathy end-stage renal disease on dialysis with peripheral vascular disease with gangrene bilateral feet.  Plan: We'll plan for bilateral transmetatarsal amputation. Risk and benefits were discussed including the risk of the wound not healing. Patient states she understands wish to proceed at this time. Plan for discharge to skilled nursing plan for dialysis in the hospital.  Nadara Mustard, MD 10/08/2016, 7:26 AM

## 2016-10-09 ENCOUNTER — Encounter (HOSPITAL_COMMUNITY): Payer: Self-pay | Admitting: Orthopedic Surgery

## 2016-10-09 LAB — RENAL FUNCTION PANEL
ALBUMIN: 3 g/dL — AB (ref 3.5–5.0)
ANION GAP: 15 (ref 5–15)
BUN: 30 mg/dL — ABNORMAL HIGH (ref 6–20)
CALCIUM: 8.7 mg/dL — AB (ref 8.9–10.3)
CO2: 26 mmol/L (ref 22–32)
Chloride: 97 mmol/L — ABNORMAL LOW (ref 101–111)
Creatinine, Ser: 5.68 mg/dL — ABNORMAL HIGH (ref 0.44–1.00)
GFR calc Af Amer: 10 mL/min — ABNORMAL LOW (ref >60)
GFR calc non Af Amer: 9 mL/min — ABNORMAL LOW (ref >60)
GLUCOSE: 126 mg/dL — AB (ref 65–99)
PHOSPHORUS: 8.1 mg/dL — AB (ref 2.5–4.6)
Potassium: 4.5 mmol/L (ref 3.5–5.1)
SODIUM: 138 mmol/L (ref 135–145)

## 2016-10-09 LAB — CBC
HCT: 32.2 % — ABNORMAL LOW (ref 36.0–46.0)
HEMOGLOBIN: 9.8 g/dL — AB (ref 12.0–15.0)
MCH: 30.1 pg (ref 26.0–34.0)
MCHC: 30.4 g/dL (ref 30.0–36.0)
MCV: 98.8 fL (ref 78.0–100.0)
PLATELETS: 168 10*3/uL (ref 150–400)
RBC: 3.26 MIL/uL — ABNORMAL LOW (ref 3.87–5.11)
RDW: 18.3 % — ABNORMAL HIGH (ref 11.5–15.5)
WBC: 5.4 10*3/uL (ref 4.0–10.5)

## 2016-10-09 LAB — GLUCOSE, CAPILLARY
GLUCOSE-CAPILLARY: 97 mg/dL (ref 65–99)
Glucose-Capillary: 116 mg/dL — ABNORMAL HIGH (ref 65–99)
Glucose-Capillary: 93 mg/dL (ref 65–99)
Glucose-Capillary: 88 mg/dL (ref 65–99)

## 2016-10-09 LAB — HEMOGLOBIN A1C
Hgb A1c MFr Bld: 5.2 % (ref 4.8–5.6)
Mean Plasma Glucose: 103 mg/dL

## 2016-10-09 MED ORDER — NEPRO/CARBSTEADY PO LIQD
237.0000 mL | Freq: Two times a day (BID) | ORAL | Status: DC
  Administered 2016-10-09 – 2016-10-13 (×3): 237 mL via ORAL
  Filled 2016-10-09 (×11): qty 237

## 2016-10-09 MED ORDER — LIDOCAINE HCL (PF) 1 % IJ SOLN: 5.0000 mL | INTRAMUSCULAR | Status: DC | PRN

## 2016-10-09 MED ORDER — CALCITRIOL 0.5 MCG PO CAPS
ORAL_CAPSULE | ORAL | Status: AC
  Administered 2016-10-09: 0.5 ug via ORAL
  Filled 2016-10-09: qty 1

## 2016-10-09 MED ORDER — DARBEPOETIN ALFA 200 MCG/0.4ML IJ SOSY
PREFILLED_SYRINGE | INTRAMUSCULAR | Status: AC
  Administered 2016-10-09: 200 ug via INTRAVENOUS
  Filled 2016-10-09: qty 0.4

## 2016-10-09 MED ORDER — SODIUM CHLORIDE 0.9 % IV SOLN: 100.0000 mL | INTRAVENOUS | Status: DC | PRN

## 2016-10-09 MED ORDER — LIDOCAINE-PRILOCAINE 2.5-2.5 % EX CREA
1.0000 "application " | TOPICAL_CREAM | CUTANEOUS | Status: DC | PRN
  Filled 2016-10-09: qty 5

## 2016-10-09 MED ORDER — HEPARIN SODIUM (PORCINE) 1000 UNIT/ML DIALYSIS
20.0000 [IU]/kg | INTRAMUSCULAR | Status: DC | PRN
  Filled 2016-10-09: qty 3

## 2016-10-09 MED ORDER — PENTAFLUOROPROP-TETRAFLUOROETH EX AERO: 1.0000 "application " | INHALATION_SPRAY | CUTANEOUS | Status: DC | PRN

## 2016-10-09 MED ORDER — HYDROMORPHONE HCL 1 MG/ML IJ SOLN
INTRAMUSCULAR | Status: AC
Start: 2016-10-09 — End: 2016-10-09
  Administered 2016-10-09: 1 mg via ARTERIOVENOUS_FISTULA
  Filled 2016-10-09: qty 1

## 2016-10-09 MED ORDER — HEPARIN SODIUM (PORCINE) 1000 UNIT/ML DIALYSIS
1000.0000 [IU] | INTRAMUSCULAR | Status: DC | PRN
  Filled 2016-10-09: qty 1

## 2016-10-09 NOTE — Procedures (Signed)
STable on HD today, no c/o's.  Has wound VAC's on bilat TMA sites.  Is 10 kg under OP dry wt.  No vol excess on exam.  Will be going to SNF.    I was present at this dialysis session, have reviewed the session itself and made  appropriate changes Vinson Moselle MD [facility] pager [phone]   10/09/2016, 10:46 AM

## 2016-10-09 NOTE — Progress Notes (Signed)
Initial Nutrition Assessment  DOCUMENTATION CODES:   Not applicable  INTERVENTION:  Provide Nepro Shake po BID, each supplement provides 425 kcal and 19 grams protein.  Continue 30 ml Prostat po BID, each supplement provides 100 kcal and 15 grams of protein.   Encourage adequate PO intake.   Recommend obtaining new weight to fully assess weight trends.  NUTRITION DIAGNOSIS:   Increased nutrient needs related to chronic illness, wound healing as evidenced by estimated needs.  GOAL:   Patient will meet greater than or equal to 90% of their needs  MONITOR:   PO intake, Supplement acceptance, Labs, Weight trends, Skin, I & O's  REASON FOR ASSESSMENT:   Malnutrition Screening Tool    ASSESSMENT:   36 y.o. female with ESRD on hemodialysis TTS at [facility]. PMH also includes DM with neuropathy,  HTN, severe PVD. 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 Pt with bilateral ischemic foot wounds, poorly healing.  She underwent bilateral transmetatarsal amputation.  Procedure (1/10): Bilateral Transmetatarsal Amputation   RD seen pt during HD procedure. Pt was moaning and seemed to be in pain during time of visit. Pt wanted to rest. RD unable to obtain most recent nutrition history. Noted no new weight recorded. Recommend obtaining new weight to fully assess weight trends. Pt reports no po intake today. RD to order Nepro Shake to aid in caloric and protein needs as well as in wound healing. Noted Prostat has been ordered. RD to continue with current orders. Unable to complete Nutrition-Focused physical exam at this time. RD to perform physical exam at next visit.  Labs and medications reviewed. Phosphorous elevated at 8.1.  Diet Order:  Diet renal with fluid restriction Fluid restriction: 1200 mL Fluid; Room service appropriate? Yes; Fluid consistency: Thin  Skin:  Wound (see comment) (Incision bilateral foot with wound  VAC)  Last BM:  1/10  Height:   Ht Readings from Last 1 Encounters:  10/01/16 5\' 8"  (1.727 m)    Weight:   Wt Readings from Last 1 Encounters:  10/01/16 242 lb (109.8 kg)    Ideal Body Weight:  60 kg (adjusted for bilat transmetatarsal foot amp)  BMI:  There is no height or weight on file to calculate BMI.  Estimated Nutritional Needs:   Kcal:  1900-2100  Protein:  105-120 grams  Fluid:  1.2 L/day  EDUCATION NEEDS:   No education needs identified at this time  Roslyn Smiling, MS, RD, LDN Pager # [phone] After hours/ weekend pager # [phone]

## 2016-10-09 NOTE — Progress Notes (Signed)
PT Cancellation Note  Patient Details Name: [name] MRN: [id number] DOB: Dec 15, 1980   Cancelled Treatment:    Reason Eval/Treat Not Completed: Patient at procedure or test/unavailable. Pt at HD, will follow as able.    Christiane Ha, PT, CSCS Pager [phone] Office [phone]  10/09/2016, 10:51 AM

## 2016-10-09 NOTE — Evaluation (Signed)
Physical Therapy Evaluation Patient Details Name: [name] MRN: [id number] DOB: 1981/09/02 Today's Date: 10/09/2016   History of Present Illness  Pt is a 36 y.o. 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 Pt now s/p transmetatarsal amputationbilaterally 10/08/16. PMH: diabetes, PVD, MI, HTN, CAD, COPD, CKD.  Clinical Impression  Pt reporting pain as her primary concern and activity limitation. During session pt able to sit EOB from supine and back with supervision. Anticipate that the pt will D/C to SNF for further rehabilitation before returning home. Pt verbalizes agreement. PT to follow acutely to progress mobility as tolerated.     Follow Up Recommendations SNF    Equipment Recommendations   (to be addressed at next venue)    Recommendations for Other Services       Precautions / Restrictions Precautions Precautions: Fall Restrictions Weight Bearing Restrictions: Yes RLE Weight Bearing: Non weight bearing LLE Weight Bearing: Non weight bearing      Mobility  Bed Mobility Overal bed mobility: Needs Assistance Bed Mobility: Supine to Sit;Sit to Supine     Supine to sit: Supervision Sit to supine: Supervision   General bed mobility comments: Pt with good trunk control, sitting straight up to long sitting from supine.   Transfers                    Ambulation/Gait                Stairs            Wheelchair Mobility    Modified Rankin (Stroke Patients Only)       Balance Overall balance assessment: Needs assistance Sitting-balance support: No upper extremity supported Sitting balance-Leahy Scale: Good Sitting balance - Comments: good stability sitting EOB                                     Pertinent Vitals/Pain Pain Assessment: 0-10 Pain Score:  (2000) Pain Location: both feet Pain Descriptors / Indicators:  Aching;Sharp Pain Intervention(s): Limited activity within patient's tolerance;Monitored during session (pt reports nursing notified)    Home Living Family/patient expects to be discharged to:: Skilled nursing facility                 Additional Comments: Pt lives with her boyfriend.     Prior Function Level of Independence: Independent         Comments: reports ambulating and using her w/c around the house.      Hand Dominance        Extremity/Trunk Assessment   Upper Extremity Assessment Upper Extremity Assessment: Overall WFL for tasks assessed    Lower Extremity Assessment Lower Extremity Assessment: Overall WFL for tasks assessed (able to move bilateral LEs without assistance)       Communication   Communication: No difficulties  Cognition Arousal/Alertness: Awake/alert Behavior During Therapy: Anxious (anxious related to pain) Overall Cognitive Status: Within Functional Limits for tasks assessed                      General Comments      Exercises     Assessment/Plan    PT Assessment Patient needs continued PT services  PT Problem List Decreased strength;Decreased range of motion;Decreased activity tolerance;Decreased balance;Decreased mobility          PT Treatment Interventions DME instruction;Functional mobility  training;Therapeutic activities;Therapeutic exercise;Patient/family education;Wheelchair mobility training    PT Goals (Current goals can be found in the Care Plan section)  Acute Rehab PT Goals Patient Stated Goal: get rid of pain PT Goal Formulation: With patient Time For Goal Achievement: 10/23/16 Potential to Achieve Goals: Good    Frequency Min 2X/week   Barriers to discharge        Co-evaluation               End of Session   Activity Tolerance: Patient limited by pain Patient left: in bed;with call bell/phone within reach;with family/visitor present Nurse Communication: Mobility status          Time: 7902-4097 PT Time Calculation (min) (ACUTE ONLY): 17 min   Charges:   PT Evaluation $PT Eval Moderate Complexity: 1 Procedure     PT G Codes:        Christiane Ha, PT, CSCS Pager [phone] Office [phone]  10/09/2016, 4:18 PM

## 2016-10-09 NOTE — Progress Notes (Signed)
Patient ID: [name], female   DOB: [birth year], 36 y.o.   MRN: [id number] Postoperative day 1 bilateral transmetatarsal amputation. Patient is in dialysis this morning. Plan for discharge to skilled nursing.

## 2016-10-10 LAB — GLUCOSE, CAPILLARY
GLUCOSE-CAPILLARY: 150 mg/dL — AB (ref 65–99)
Glucose-Capillary: 97 mg/dL (ref 65–99)
Glucose-Capillary: 146 mg/dL — ABNORMAL HIGH (ref 65–99)

## 2016-10-10 MED ORDER — OXYCODONE HCL 5 MG PO TABS: 5.0000 mg | ORAL_TABLET | ORAL | 0 refills | Status: DC | PRN

## 2016-10-10 NOTE — Progress Notes (Signed)
Physical Therapy Treatment Patient Details Name: Chauntell Credit MRN: [id number] DOB: [birth year] Today's Date: 10/10/2016    History of Present Illness Pt is a 36 y.o. 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 Pt now s/p transmetatarsal amputationbilaterally 10/08/16. PMH: diabetes, PVD, MI, HTN, CAD, COPD, CKD.    PT Comments    Patient tolerated AP transfer EOB to recliner with min guard/min A. Continue to progress as tolerated.   Follow Up Recommendations  SNF     Equipment Recommendations   (to be addressed at next venue)    Recommendations for Other Services       Precautions / Restrictions Precautions Precautions: Fall Restrictions Weight Bearing Restrictions: Yes RLE Weight Bearing: Non weight bearing LLE Weight Bearing: Non weight bearing    Mobility  Bed Mobility Overal bed mobility: Modified Independent Bed Mobility: Supine to Sit           General bed mobility comments: increased time and effort  Transfers Overall transfer level: Needs assistance   Transfers: Licensed conveyancer transfers: Min guard;Min assist   General transfer comment: grossly min guard for safety mainly due to bilat wound vacs; cues for sequencing and technique; no physical assistance required  Ambulation/Gait                 Stairs            Wheelchair Mobility    Modified Rankin (Stroke Patients Only)       Balance Overall balance assessment: Needs assistance Sitting-balance support: No upper extremity supported Sitting balance-Leahy Scale: Good Sitting balance - Comments: good stability sitting EOB                            Cognition Arousal/Alertness: Awake/alert (drowsy but easily arousable) Behavior During Therapy: WFL for tasks assessed/performed Overall Cognitive Status: Within Functional Limits for  tasks assessed                      Exercises      General Comments General comments (skin integrity, edema, etc.): pt sleeping with O2 doffed upon arrival; 2L O2 via [place] reapplied during session due to O2 desat in 70s; SpO2 100% end of session       Pertinent Vitals/Pain Pain Assessment: Faces Faces Pain Scale: Hurts even more Pain Location: both feet with mobility Pain Descriptors / Indicators: Aching;Sore;Grimacing;Guarding Pain Intervention(s): Limited activity within patient's tolerance;Repositioned;Premedicated before session;Monitored during session    Home Living                      Prior Function            PT Goals (current goals can now be found in the care plan section) Acute Rehab PT Goals Patient Stated Goal: none stated Progress towards PT goals: Progressing toward goals    Frequency    Min 2X/week      PT Plan Current plan remains appropriate    Co-evaluation             End of Session   Activity Tolerance: Patient tolerated treatment well Patient left: with call bell/phone within reach;with family/visitor present;in chair     Time: 1962-2297 PT Time Calculation (min) (ACUTE ONLY): 27 min  Charges:  $Therapeutic Activity: 23-37 mins  G Codes:      Derek Mound, PTA Pager: [phone]   10/10/2016, 2:46 PM

## 2016-10-10 NOTE — NC FL2 (Signed)
[place] MEDICAID FL2 LEVEL OF CARE SCREENING TOOL     IDENTIFICATION  Patient Name: [name] Birthdate: [birth year] Sex: female Admission Date (Current Location): 10/08/2016  [facility] and [place] Number:  Producer, television/film/video and Address:  [facility] [place], [address], [place], [place] [address]      Provider Number: [id number]  Attending Physician Name and Address:  Nadara Mustard, MD  Relative Name and Phone Number:       Current Level of Care: Hospital Recommended Level of Care: Skilled Nursing Facility Prior Approval Number:    Date Approved/Denied: 10/10/16 PASRR Number: [id number]  Discharge Plan: SNF    Current Diagnoses: Patient Active Problem List   Diagnosis Date Noted  . Status post transmetatarsal amputation of right foot (HCC) 10/08/2016  . 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 Great toe amputation status, right (HCC) 08/01/2016  . Diabetic osteomyelitis (HCC)   . Gangrene (HCC)   . 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 Diabetic foot infection (HCC) 06/20/2016  . ESRD (end stage renal disease) on dialysis (HCC) 06/20/2016  . Diabetic foot ulcer (HCC) 06/20/2016  . Pre-operative clearance 10/12/2015  . Normal coronary arteries 10/12/2015  . NSTEMI- type 2, Troponin 11.2 05/11/2015  . Respiratory failure requiring intubation (HCC) 05/10/2015  . History of arthroscopy of right shoulder-05/10/15 05/10/2015  . CKD (chronic kidney disease) stage 5, GFR less than 15 ml/min (HCC) 09/21/2014  . Unspecified asthma(493.90) 10/25/2013  . Acute combined systolic and diastolic heart failure (HCC) 09/21/2013  . Non-ischemic cardiomyopathy - by echo 8/16- EF 35-40% 09/16/2013  . Morbid obesity-  09/16/2013  . 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) 09/15/2013  . Normocytic anemia 09/15/2013  . Lower extremity edema 09/15/2013  . Hyperlipidemia   .  Hypertension     Orientation RESPIRATION BLADDER Height & Weight     Self, Time, Situation, Place  O2 (Nasal Cannula; 2L) Continent Weight:   Height:     BEHAVIORAL SYMPTOMS/MOOD NEUROLOGICAL BOWEL NUTRITION STATUS      Continent  (Please see discharge summary)  AMBULATORY STATUS COMMUNICATION OF NEEDS Skin   Extensive Assist Verbally Wound Vac, Surgical wounds (Foot bilateral, negative pressure wound therapy, target pressure .Closed inicision left arm. Loss of skin 4th and 5th toe.)                       Personal Care Assistance Level of Assistance  Bathing, Feeding, Dressing Bathing Assistance: Maximum assistance Feeding assistance: Independent Dressing Assistance: Maximum assistance     Functional Limitations Info  Sight, Hearing, Speech Sight Info: Adequate Hearing Info: Adequate Speech Info: Adequate    SPECIAL CARE FACTORS FREQUENCY  PT (By licensed PT), OT (By licensed OT)     PT Frequency: 2x week OT Frequency: 2x week            Contractures Contractures Info: Not present    Additional Factors Info  Code Status, Allergies Code Status Info: Full Allergies Info: Aspirin, Sulfur, Tramadol           Current Medications (10/10/2016):  This is the current hospital active medication list Current Facility-Administered Medications  Medication Dose Route Frequency Provider Last Rate Last Dose  . 0.9 %  sodium chloride infusion   Intravenous Continuous Nadara Mustard, MD 10 mL/hr at 10/08/16 1415    . acetaminophen (TYLENOL) tablet 650 mg  650 mg  Oral Q6H PRN Nadara Mustard, MD       Or  . acetaminophen (TYLENOL) suppository 650 mg  650 mg Rectal Q6H PRN Nadara Mustard, MD      . albuterol (PROVENTIL) (2.5 MG/3ML) 0.083% nebulizer solution 3 mL  3 mL Inhalation Q6H PRN Nadara Mustard, MD      . atorvastatin (LIPITOR) tablet 40 mg  40 mg Oral QHS Nadara Mustard, MD   40 mg at 10/09/16 2142  . budesonide (PULMICORT) nebulizer solution 0.25 mg  0.25 mg  Nebulization BID PRN Nadara Mustard, MD      . calcitRIOL (ROCALTROL) capsule 0.5 mcg  0.5 mcg Oral Q T,Th,Sa-HD Tomasa Blase, PA-C   0.5 mcg at 10/09/16 1151  . carvedilol (COREG) tablet 25 mg  25 mg Oral BID Nadara Mustard, MD   25 mg at 10/09/16 2142  . clopidogrel (PLAVIX) tablet 75 mg  75 mg Oral Daily Nadara Mustard, MD      . Darbepoetin Alfa (ARANESP) injection 200 mcg  200 mcg Intravenous Q Thu-HD Tomasa Blase, PA-C   200 mcg at 10/09/16 0943  . DULoxetine (CYMBALTA) DR capsule 30 mg  30 mg Oral Daily Nadara Mustard, MD   30 mg at 10/09/16 1333  . feeding supplement (NEPRO CARB STEADY) liquid 237 mL  237 mL Oral BID BM Nadara Mustard, MD   237 mL at 10/09/16 1600  . feeding supplement (PRO-STAT SUGAR FREE 64) liquid 30 mL  30 mL Oral BID Megan Mans Ejigiri, PA-C   30 mL at 10/09/16 1336  . ferric gluconate (NULECIT) 62.5 mg in sodium chloride 0.9 % 100 mL IVPB  62.5 mg Intravenous Q Thu-HD Tomasa Blase, PA-C   62.5 mg at 10/09/16 1421  . HYDROmorphone (DILAUDID) injection 1 mg  1 mg Intravenous Q2H PRN Nadara Mustard, MD   1 mg at 10/09/16 2143  . hydrOXYzine (ATARAX/VISTARIL) tablet 25 mg  25 mg Oral Q6H Nadara Mustard, MD   25 mg at 10/10/16 0354  . insulin aspart (novoLOG) injection 0-9 Units  0-9 Units Subcutaneous TID WC Nadara Mustard, MD      . insulin aspart (novoLOG) injection 3 Units  3 Units Subcutaneous TID WC Nadara Mustard, MD   3 Units at 10/09/16 1730  . insulin glargine (LANTUS) injection 10 Units  10 Units Subcutaneous QHS Nadara Mustard, MD   10 Units at 10/09/16 2143  . isosorbide dinitrate (ISORDIL) tablet 20 mg  20 mg Oral TID Nadara Mustard, MD   20 mg at 10/09/16 2142  . methocarbamol (ROBAXIN) tablet 500 mg  500 mg Oral Q6H PRN Nadara Mustard, MD   500 mg at 10/10/16 0112   Or  . methocarbamol (ROBAXIN) 500 mg in dextrose 5 % 50 mL IVPB  500 mg Intravenous Q6H PRN Nadara Mustard, MD      . metoCLOPramide (REGLAN) tablet 5-10 mg  5-10 mg Oral Q8H PRN  Nadara Mustard, MD       Or  . metoCLOPramide (REGLAN) injection 5-10 mg  5-10 mg Intravenous Q8H PRN Nadara Mustard, MD   10 mg at 10/08/16 1533  . multivitamin (RENA-VIT) tablet 1 tablet  1 tablet Oral QHS Tomasa Blase, PA-C   1 tablet at 10/09/16 2143  . ondansetron (ZOFRAN) tablet 4 mg  4 mg Oral Q6H PRN Nadara Mustard, MD       Or  .  ondansetron (ZOFRAN) injection 4 mg  4 mg Intravenous Q6H PRN Nadara Mustard, MD   4 mg at 10/08/16 1500  . oxyCODONE (Oxy IR/ROXICODONE) immediate release tablet 5-10 mg  5-10 mg Oral Q4H PRN Nadara Mustard, MD   10 mg at 10/10/16 0615  . pregabalin (LYRICA) capsule 50 mg  50 mg Oral TID Nadara Mustard, MD   50 mg at 10/09/16 2142  . sevelamer carbonate (RENVELA) tablet 3,200 mg  3,200 mg Oral TID WC Nadara Mustard, MD   3,200 mg at 10/09/16 1621     Discharge Medications: Please see discharge summary for a list of discharge medications.  Relevant Imaging Results:  Relevant Lab Results:   Additional Information SSN: [id number]  Ht: 5\' 8"  (1.727 m)  Wt: 242 lb (109.8 kg  Pt will be LOG   [employer], LCSW

## 2016-10-10 NOTE — Clinical Social Work Note (Signed)
Per pt's support at home pt will not be a candidate for a LOG. CSW shared this information with pt's husband as pt was asleep and would not arouse to CSW voice. RNCM notified. Clinical Social Worker will sign off for now as social work intervention is no longer needed. Please consult [place] again if new need arises.  [address] [place] [phone]

## 2016-10-10 NOTE — Clinical Social Work Note (Deleted)
CSW staffed case with Warehouse manager. Pt will be a LOG. CSW will follow up with pt and fax out the referral to seek placement.   [address], [place] [phone]

## 2016-10-10 NOTE — Progress Notes (Signed)
[facility] Progress Note   Subjective: feeling ok, no c/o's at this time  Vitals:   10/09/16 1118 10/09/16 1500 10/09/16 2200 10/10/16 0704  BP: 97/67 95/61 (!) 109/57 94/60  Pulse: 87 71 81 65  Resp: 16 16 18 16   Temp: 98.5 F (36.9 C) 98.9 F (37.2 C) 97.7 F (36.5 C) 98.2 F (36.8 C)  TempSrc: Oral Oral Oral Oral  SpO2: 98%  98% 92%    Inpatient medications: . atorvastatin  40 mg Oral QHS  . calcitRIOL  0.5 mcg Oral Q T,Th,Sa-HD  . carvedilol  25 mg Oral BID  . clopidogrel  75 mg Oral Daily  . darbepoetin (ARANESP) injection - DIALYSIS  200 mcg Intravenous Q Thu-HD  . DULoxetine  30 mg Oral Daily  . feeding supplement (NEPRO CARB STEADY)  237 mL Oral BID BM  . feeding supplement (PRO-STAT SUGAR FREE 64)  30 mL Oral BID  . ferric gluconate (FERRLECIT/NULECIT) IV  62.5 mg Intravenous Q Thu-HD  . hydrOXYzine  25 mg Oral Q6H  . insulin aspart  0-9 Units Subcutaneous TID WC  . insulin aspart  3 Units Subcutaneous TID WC  . insulin glargine  10 Units Subcutaneous QHS  . isosorbide dinitrate  20 mg Oral TID  . multivitamin  1 tablet Oral QHS  . pregabalin  50 mg Oral TID  . sevelamer carbonate  3,200 mg Oral TID WC   . sodium chloride 10 mL/hr at 10/08/16 1415   acetaminophen **OR** acetaminophen, albuterol, budesonide, HYDROmorphone (DILAUDID) injection, methocarbamol **OR** methocarbamol (ROBAXIN)  IV, metoCLOPramide **OR** metoCLOPramide (REGLAN) injection, ondansetron **OR** ondansetron (ZOFRAN) IV, oxyCODONE  Exam: General: WDWN AAF NAD Lungs: CTA bilaterally  Heart: RRR with S1 S2.  Abdomen: soft NT + BS Lower extremities: bilat transmet amputation dressed with wound VAC Neuro: A & O  X 3.  Dialysis Access: LUE AVF +thrill/bruit   Dialysis Orders:  [facility] TTS 4h  120kg   2/2 bath  Hep 3600   LUE AVF Calcitriol 0.5 mcg PO 3x q week  Aranesp 200 mcg IV q week (last 1/4) Venofer 50 mg IV q week (last 1/4) OP Labs: Hgb 9.9 Tsat 28% Ca 9.2  Ph 7.1 PTH 193   Assessment: 1.  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  ESRD -  TTS HD 3.  Hypertension/volume  -  BP slightly elevated, home meds Coreg/Isordil / no volume excess on exam 4.  Anemia  - Hgb 10.2 Cont OP ESA/Fe 5.  Metabolic bone disease -  Cont VDRA/binders - follow P with renal panel  6.  Nutrition - renal diet/vitamins/protein supp  7. DM - per primary   Plan - HD Saturday   Vinson Moselle MD [facility] pager [phone]   10/10/2016, 1:44 PM    Recent Labs Lab 10/08/16 1033 10/09/16 0709  NA 138 138  K 3.6 4.5  CL 97* 97*  CO2 28 26  GLUCOSE 85 126*  BUN 22* 30*  CREATININE 4.63* 5.68*  CALCIUM 8.5* 8.7*  PHOS  --  8.1*    Recent Labs Lab 10/09/16 0709  ALBUMIN 3.0*    Recent Labs Lab 10/08/16 1033 10/09/16 0709  WBC 5.4 5.4  HGB 10.2* 9.8*  HCT 32.1* 32.2*  MCV 97.9 98.8  PLT 193 168   Iron/TIBC/Ferritin/ %Sat    Component Value Date/Time   IRON 52 02/05/2016 1032   TIBC 305 02/05/2016 1032   FERRITIN 392 (H) 02/05/2016 1032  IRONPCTSAT 17 02/05/2016 1032

## 2016-10-10 NOTE — Discharge Summary (Signed)
Discharge Diagnoses:  Active Problems:   Status post transmetatarsal amputation of right foot [facility]) Status post transmetatarsal amputation of left foot  Surgeries: Procedure(s): Bilateral Transmetatarsal Amputation on 10/08/2016    Consultants: Treatment Team:  Delano Metz, MD  Discharged Condition: Improved  Hospital Course: [name] is an 36 y.o. female who was admitted 10/08/2016 with a chief complaint of gangrenous changes bilateral forefoot, with a final diagnosis of Gangrene Bilateral Forefoot.  Patient was brought to the operating room on 10/08/2016 and underwent Procedure(s): Bilateral Transmetatarsal Amputation.    Patient was given perioperative antibiotics: Anti-infectives    Start     Dose/Rate Route Frequency Ordered Stop   10/09/16 0000  ceFAZolin (ANCEF) IVPB 2g/100 mL premix     2 g 200 mL/hr over 30 Minutes Intravenous Every 12 hours 10/08/16 1404 10/09/16 1230   10/08/16 1230  ceFAZolin (ANCEF) IVPB 2g/100 mL premix     2 g 200 mL/hr over 30 Minutes Intravenous On call to O.R. 10/08/16 1021 10/08/16 1141   10/08/16 1023  ceFAZolin (ANCEF) 2-4 GM/100ML-% IVPB    Comments:  Ray Church   : cabinet override      10/08/16 1023 10/08/16 1141    .  Patient was given sequential compression devices, early ambulation, and aspirin for DVT prophylaxis.  Recent vital signs: Patient Vitals for the past 24 hrs:  BP Temp Temp src Pulse Resp SpO2  10/09/16 2200 (!) 109/57 97.7 F (36.5 C) Oral 81 18 98 %  10/09/16 1500 95/61 98.9 F (37.2 C) Oral 71 16 -  10/09/16 1118 97/67 98.5 F (36.9 C) Oral 87 16 98 %  10/09/16 1100 (!) 85/43 - - 88 - -  10/09/16 1030 (!) 88/52 - - 94 - -  10/09/16 1000 (!) 89/65 - - 97 - -  10/09/16 0930 93/73 - - (!) 101 - -  10/09/16 0900 (!) 89/56 - - 99 - -  10/09/16 0830 108/73 - - - - -  10/09/16 0800 (!) 110/58 - - 98 - -  10/09/16 0730 116/76 - - 98 - -  10/09/16 0702 114/76 - - 95 - -  10/09/16 0646 (!) 140/45 98.4 F (36.9 C)  Oral 89 - -  .  Recent laboratory studies: No results found.  Discharge Medications:   Allergies as of 10/10/2016      Reactions   Aspirin Anaphylaxis   Sulfur Hives   Tramadol Hives, Other (See Comments)   Pt states she feels weird      Medication List    STOP taking these medications   ciprofloxacin 500 MG tablet Commonly known as:  CIPRO   clindamycin 150 MG capsule Commonly known as:  CLEOCIN   metroNIDAZOLE 500 MG tablet Commonly known as:  FLAGYL   nitroGLYCERIN 0.2 mg/hr patch Commonly known as:  NITRODUR - Dosed in mg/24 hr   oxyCODONE-acetaminophen 5-325 MG tablet Commonly known as:  PERCOCET/ROXICET   silver sulfADIAZINE 1 % cream Commonly known as:  SILVADENE   traMADol 50 MG tablet Commonly known as:  ULTRAM     TAKE these medications   albuterol 108 (90 Base) MCG/ACT inhaler Commonly known as:  PROVENTIL HFA;VENTOLIN HFA Inhale 2 puffs into the lungs every 6 (six) hours as needed for wheezing or shortness of breath.   albuterol (2.5 MG/3ML) 0.083% nebulizer solution Commonly known as:  PROVENTIL Take 2.5 mg by nebulization every 6 (six) hours as needed for wheezing or shortness of breath.   atorvastatin 40 MG  tablet Commonly known as:  LIPITOR Take 40 mg by mouth at bedtime.   budesonide 0.25 MG/2ML nebulizer solution Commonly known as:  PULMICORT Take 0.25 mg by nebulization 2 (two) times daily as needed (shortness of breath or wheezing).   carvedilol 25 MG tablet Commonly known as:  COREG Take 25 mg by mouth 2 (two) times daily.   clopidogrel 75 MG tablet Commonly known as:  PLAVIX Take 1 tablet (75 mg total) by mouth daily.   diphenhydrAMINE 25 MG tablet Commonly known as:  BENADRYL Take 25 mg by mouth every 6 (six) hours as needed for itching.   DULoxetine 30 MG capsule Commonly known as:  CYMBALTA Take 1 capsule (30 mg total) by mouth daily. Qam   hydrOXYzine 25 MG tablet Commonly known as:  ATARAX/VISTARIL Take 1 tablet (25  mg total) by mouth every 6 (six) hours.   Insulin Glargine 100 UNIT/ML Solostar Pen Commonly known as:  LANTUS SOLOSTAR Inject 10 Units into the skin at bedtime. What changed:  how much to take   isosorbide dinitrate 20 MG tablet Commonly known as:  ISORDIL Take 20 mg by mouth 3 (three) times daily.   NOVOLOG FLEXPEN 100 UNIT/ML FlexPen Generic drug:  insulin aspart Inject 2-15 Units into the skin 3 (three) times daily with meals. Per sliding scale - based on carb count and CBG   nystatin powder Commonly known as:  MYCOSTATIN/NYSTOP Apply topically 4 (four) times daily. To each groin area   oxyCODONE 5 MG immediate release tablet Commonly known as:  ROXICODONE Take 1 tablet (5 mg total) by mouth every 4 (four) hours as needed for breakthrough pain. What changed:  when to take this   pregabalin 50 MG capsule Commonly known as:  LYRICA Take 1 capsule (50 mg total) by mouth 3 (three) times daily.   sevelamer carbonate 800 MG tablet Commonly known as:  RENVELA Take 3,200 mg by mouth 3 (three) times daily with meals.       Diagnostic Studies: Ct Pelvis Wo Contrast  Result Date: 09/11/2016 CLINICAL DATA:  36 y/o F; post angiogram with mass in the left groin. EXAM: CT PELVIS WITHOUT CONTRAST TECHNIQUE: Multidetector CT imaging of the pelvis was performed following the standard protocol without intravenous contrast. COMPARISON:  None. FINDINGS: Urinary Tract:  No abnormality visualized. Bowel:  Unremarkable visualized pelvic bowel loops. Vascular/Lymphatic: No pathologically enlarged lymph node. Extensive vascular calcifications of the iliac and femoral arteries. Reproductive:  No mass or other significant abnormality Other: Ill-defined nodularity within the left groin subcutaneous fat measuring approximately 18 x 67 x 65 mm (AP x ML x CC) series 201, image 41 and series 203, image 57. Given recent catheterizations probably represents a hematoma. There is stranding of fat extending  throughout the left groin compatible with reactive edema. No retroperitoneal extension of the hematoma is present. Musculoskeletal: No suspicious bone lesions identified. IMPRESSION: Ill-defined nodularity within the left groin subcutaneous fat is probably a hematoma in the setting of catheterization spanning up to the 6.7 cm, approximately 40 cc. There is surrounding edema/infiltration in fat. No retroperitoneal extension. Electronically Signed   By: Mitzi Hansen M.D.   On: 09/11/2016 00:49   Dg Foot Complete Left  Result Date: 09/21/2016 CLINICAL DATA:  Foot pain for several months with increasing pain over the last few days, discolored toes, initial encounter EXAM: LEFT FOOT - COMPLETE 3+ VIEW COMPARISON:  08/25/2016 FINDINGS: No acute fracture or dislocation is noted. Diffuse vascular calcifications are noted. No bony erosion  to suggest osteomyelitis is seen. Mild soft tissue swelling is noted along the distal foot. IMPRESSION: Soft tissue swelling without acute bony abnormality. Electronically Signed   By: Alcide Clever M.D.   On: 09/21/2016 21:16    Patient benefited maximally from their hospital stay and there were no complications.     Disposition: 01-Home or Self Care Discharge Instructions    Call MD / Call 911    Complete by:  As directed    If you experience chest pain or shortness of breath, CALL 911 and be transported to the hospital emergency room.  If you develope a fever above 101 F, pus (white drainage) or increased drainage or redness at the wound, or calf pain, call your surgeon's office.   Constipation Prevention    Complete by:  As directed    Drink plenty of fluids.  Prune juice may be helpful.  You may use a stool softener, such as Colace (over the counter) 100 mg twice a day.  Use MiraLax (over the counter) for constipation as needed.   Diet - low sodium heart healthy    Complete by:  As directed    Increase activity slowly as tolerated    Complete by:  As  directed    Non weight bearing    Complete by:  As directed    Laterality:  bilateral   Extremity:  Lower     Follow-up Information    Nadara Mustard, MD Follow up in 2 week(s).   Specialty:  Orthopedic Surgery Contact information: [address] [place] [address] [phone]            Signed: Nadara Mustard 10/10/2016, 6:39 AM

## 2016-10-10 NOTE — Care Management Note (Signed)
Case Management Note  Patient Details  Name: [name] MRN: [id number] Date of Birth: August 24, 1981  Subjective/Objective:    36 yr old female s/p transmetatarsal amputations right and left feet.                  Action/Plan: Case manager spoke with patient's husband. Unfortunately patient will not be able to receive Home Health therapy under medicaid. CM has requested wheelchair and husband will contact [employer] concerning a ramp for their house.    Expected Discharge Date:  10/10/16               Expected Discharge Plan:     In-House Referral:  NA  Discharge planning Services  CM Consult  Post Acute Care Choice:  Durable Medical Equipment Choice offered to:  Spouse  DME Arranged:  Wheelchair manual DME Agency:  [facility]  HH Arranged:  NA HH Agency:  NA  Status of Service:  Completed, signed off  If discussed at Long Length of Stay Meetings, dates discussed:    Additional Comments:  Durenda Guthrie, RN 10/10/2016, 5:25 PM

## 2016-10-11 LAB — CBC
HEMATOCRIT: 33.1 % — AB (ref 36.0–46.0)
Hemoglobin: 10.3 g/dL — ABNORMAL LOW (ref 12.0–15.0)
MCH: 30 pg (ref 26.0–34.0)
MCHC: 31.1 g/dL (ref 30.0–36.0)
MCV: 96.5 fL (ref 78.0–100.0)
PLATELETS: 165 10*3/uL (ref 150–400)
RBC: 3.43 MIL/uL — ABNORMAL LOW (ref 3.87–5.11)
RDW: 17.6 % — AB (ref 11.5–15.5)
WBC: 8.3 10*3/uL (ref 4.0–10.5)

## 2016-10-11 LAB — RENAL FUNCTION PANEL
Albumin: 2.9 g/dL — ABNORMAL LOW (ref 3.5–5.0)
Anion gap: 15 (ref 5–15)
BUN: 32 mg/dL — AB (ref 6–20)
CHLORIDE: 94 mmol/L — AB (ref 101–111)
CO2: 27 mmol/L (ref 22–32)
CREATININE: 6.46 mg/dL — AB (ref 0.44–1.00)
Calcium: 8.9 mg/dL (ref 8.9–10.3)
GFR calc Af Amer: 9 mL/min — ABNORMAL LOW (ref >60)
GFR, EST NON AFRICAN AMERICAN: 7 mL/min — AB (ref >60)
Glucose, Bld: 97 mg/dL (ref 65–99)
POTASSIUM: 4.5 mmol/L (ref 3.5–5.1)
Phosphorus: 7.2 mg/dL — ABNORMAL HIGH (ref 2.5–4.6)
Sodium: 136 mmol/L (ref 135–145)

## 2016-10-11 LAB — GLUCOSE, CAPILLARY
GLUCOSE-CAPILLARY: 109 mg/dL — AB (ref 65–99)
GLUCOSE-CAPILLARY: 98 mg/dL (ref 65–99)
Glucose-Capillary: 104 mg/dL — ABNORMAL HIGH (ref 65–99)
Glucose-Capillary: 81 mg/dL (ref 65–99)

## 2016-10-11 MED ORDER — SODIUM CHLORIDE 0.9 % IV SOLN: 100.0000 mL | INTRAVENOUS | Status: DC | PRN

## 2016-10-11 MED ORDER — ALBUMIN HUMAN 25 % IV SOLN
12.5000 g | Freq: Once | INTRAVENOUS | Status: AC
  Administered 2016-10-11: 12.5 g via INTRAVENOUS

## 2016-10-11 MED ORDER — METHOCARBAMOL 500 MG PO TABS
500.0000 mg | ORAL_TABLET | Freq: Three times a day (TID) | ORAL | Status: DC | PRN
  Administered 2016-10-11 – 2016-10-13 (×3): 500 mg via ORAL
  Filled 2016-10-11 (×3): qty 1

## 2016-10-11 MED ORDER — LIDOCAINE HCL (PF) 1 % IJ SOLN: 5.0000 mL | INTRAMUSCULAR | Status: DC | PRN

## 2016-10-11 MED ORDER — HEPARIN SODIUM (PORCINE) 1000 UNIT/ML DIALYSIS: 2000.0000 [IU] | Freq: Once | INTRAMUSCULAR | Status: DC

## 2016-10-11 MED ORDER — CALCITRIOL 0.5 MCG PO CAPS
ORAL_CAPSULE | ORAL | Status: AC
  Filled 2016-10-11: qty 1

## 2016-10-11 MED ORDER — PENTAFLUOROPROP-TETRAFLUOROETH EX AERO: 1.0000 "application " | INHALATION_SPRAY | CUTANEOUS | Status: DC | PRN

## 2016-10-11 MED ORDER — ALBUMIN HUMAN 25 % IV SOLN
INTRAVENOUS | Status: AC
  Filled 2016-10-11: qty 100

## 2016-10-11 MED ORDER — METHOCARBAMOL 1000 MG/10ML IJ SOLN: 500.0000 mg | Freq: Four times a day (QID) | INTRAVENOUS | Status: DC | PRN

## 2016-10-11 MED ORDER — HEPARIN SODIUM (PORCINE) 1000 UNIT/ML DIALYSIS: 1000.0000 [IU] | INTRAMUSCULAR | Status: DC | PRN

## 2016-10-11 MED ORDER — LIDOCAINE-PRILOCAINE 2.5-2.5 % EX CREA: 1.0000 "application " | TOPICAL_CREAM | CUTANEOUS | Status: DC | PRN

## 2016-10-11 MED ORDER — ALTEPLASE 2 MG IJ SOLR: 2.0000 mg | Freq: Once | INTRAMUSCULAR | Status: DC | PRN

## 2016-10-11 NOTE — Progress Notes (Signed)
Pt aoX4 during shift change with RN Zahra at bedside . She denied pain . But was made aware of her pain management for tonight to prevent oversedation . Pt understood .

## 2016-10-11 NOTE — Progress Notes (Signed)
Post dialysis, patient is able to answer questions but drifts back to sleep almost immediately. She is unable to hold conversation. Per MAR pt received lots of po narcotics overnight. BP is 91/55, will hold narcotics for now. Pt's boyfriend is uncomfortable of taking her home today. Dr. August Saucer made aware and ordered to keep patient till fully awake then discharge. Will continue to monitor pt.

## 2016-10-11 NOTE — Progress Notes (Signed)
Give report to dialysis rn - ss

## 2016-10-11 NOTE — Progress Notes (Signed)
Pt teaching provided about over sedation and pain management . Pt advised she could get robaxin - for pain / muscle spasm and would be given tylenol for additional pain . Pt given  Ice packs for relief and advised to call prior to pain becoming a level 5 or greater . Pt verbalized understanding .

## 2016-10-11 NOTE — Progress Notes (Signed)
[facility] Progress Note   Subjective: on HD, BP's low , pt groggy.  Getting lots of po narcotics, muscle relaxants. Will decrease interval.   Vitals:   10/11/16 0900 10/11/16 0929 10/11/16 1000 10/11/16 1019  BP: (!) 86/52 (!) 86/53 (!) 68/40 (!) 102/33  Pulse: 78 81 63 94  Resp:      Temp:      TempSrc:      SpO2:      Weight:        Inpatient medications: . atorvastatin  40 mg Oral QHS  . calcitRIOL  0.5 mcg Oral Q T,Th,Sa-HD  . carvedilol  25 mg Oral BID  . clopidogrel  75 mg Oral Daily  . darbepoetin (ARANESP) injection - DIALYSIS  200 mcg Intravenous Q Thu-HD  . DULoxetine  30 mg Oral Daily  . feeding supplement (NEPRO CARB STEADY)  237 mL Oral BID BM  . feeding supplement (PRO-STAT SUGAR FREE 64)  30 mL Oral BID  . ferric gluconate (FERRLECIT/NULECIT) IV  62.5 mg Intravenous Q Thu-HD  . [START ON 10/12/2016] heparin  2,000 Units Dialysis Once in dialysis  . hydrOXYzine  25 mg Oral Q6H  . insulin aspart  0-9 Units Subcutaneous TID WC  . insulin aspart  3 Units Subcutaneous TID WC  . insulin glargine  10 Units Subcutaneous QHS  . isosorbide dinitrate  20 mg Oral TID  . multivitamin  1 tablet Oral QHS  . pregabalin  50 mg Oral TID  . sevelamer carbonate  3,200 mg Oral TID WC   . sodium chloride 10 mL/hr at 10/08/16 1415   sodium chloride, sodium chloride, acetaminophen **OR** acetaminophen, albuterol, alteplase, budesonide, heparin, HYDROmorphone (DILAUDID) injection, lidocaine (PF), lidocaine-prilocaine, methocarbamol **OR** methocarbamol (ROBAXIN)  IV, metoCLOPramide **OR** metoCLOPramide (REGLAN) injection, ondansetron **OR** ondansetron (ZOFRAN) IV, oxyCODONE, pentafluoroprop-tetrafluoroeth  Exam: General: WDWN AAF NAD Lungs: CTA bilaterally  Heart: RRR with S1 S2.  Abdomen: soft NT + BS Lower extremities: bilat transmet amputation dressed with wound VAC Neuro: A & O  X 3.  Dialysis Access: LUE AVF +thrill/bruit   Dialysis Orders:  [facility]  TTS 4h  120kg   2/2 bath  Hep 3600   LUE AVF Calcitriol 0.5 mcg PO 3x q week  Aranesp 200 mcg IV q week (last 1/4) Venofer 50 mg IV q week (last 1/4) OP Labs: Hgb 9.9 Tsat 28% Ca 9.2 Ph 7.1 PTH 193   Assessment: 1.  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  Hypotension - have dc'd isordil and coreg for now, and ^'d Robaxin interval 3.  ESRD -  TTS HD 4.  Volume - is up 3-4 kg but BP's too low for UF 5.  Anemia  - Hgb 10.2 Cont OP ESA/Fe 6.  Metabolic bone disease -  Cont VDRA/binders - follow P with renal panel  7.  Nutrition - renal diet/vitamins/protein supp  8. DM - per primary   Plan - as above   Vinson Moselle MD [facility] pager [phone]   10/11/2016, 10:32 AM    Recent Labs Lab 10/08/16 1033 10/09/16 0709 10/11/16 0800  NA 138 138 136  K 3.6 4.5 4.5  CL 97* 97* 94*  CO2 28 26 27   GLUCOSE 85 126* 97  BUN 22* 30* 32*  CREATININE 4.63* 5.68* 6.46*  CALCIUM 8.5* 8.7* 8.9  PHOS  --  8.1* 7.2*    Recent Labs Lab 10/09/16 0709 10/11/16 0800  ALBUMIN 3.0* 2.9*  Recent Labs Lab 10/08/16 1033 10/09/16 0709 10/11/16 0800  WBC 5.4 5.4 8.3  HGB 10.2* 9.8* 10.3*  HCT 32.1* 32.2* 33.1*  MCV 97.9 98.8 96.5  PLT 193 168 165   Iron/TIBC/Ferritin/ %Sat    Component Value Date/Time   IRON 52 02/05/2016 1032   TIBC 305 02/05/2016 1032   FERRITIN 392 (H) 02/05/2016 1032   IRONPCTSAT 17 02/05/2016 1032

## 2016-10-12 LAB — GLUCOSE, CAPILLARY
GLUCOSE-CAPILLARY: 125 mg/dL — AB (ref 65–99)
Glucose-Capillary: 78 mg/dL (ref 65–99)
Glucose-Capillary: 113 mg/dL — ABNORMAL HIGH (ref 65–99)
Glucose-Capillary: 96 mg/dL (ref 65–99)

## 2016-10-12 LAB — HEPATITIS B SURFACE ANTIGEN: Hepatitis B Surface Ag: NEGATIVE

## 2016-10-12 NOTE — Progress Notes (Signed)
Subjective: Patient stable and awake this morning.  Bilateral wound vacs on both feet   Objective: Vital signs in last 24 hours: Temp:  [97.3 F (36.3 C)-100.4 F (38 C)] 100.4 F (38 C) (01/14 0626) Pulse Rate:  [67-94] 90 (01/14 0626) Resp:  [14-16] 16 (01/13 1300) BP: (68-126)/(33-73) 118/72 (01/14 0626) SpO2:  [95 %-100 %] 99 % (01/14 0626) Weight:  [274 lb 7.6 oz (124.5 kg)] 274 lb 7.6 oz (124.5 kg) (01/13 1220)  Intake/Output from previous day: 01/13 0701 - 01/14 0700 In: 240 [P.O.:240] Out: 0  Intake/Output this shift: No intake/output data recorded.  Exam:  No cellulitis present  Labs:  Recent Labs  10/11/16 0800  HGB 10.3*    Recent Labs  10/11/16 0800  WBC 8.3  RBC 3.43*  HCT 33.1*  PLT 165    Recent Labs  10/11/16 0800  NA 136  K 4.5  CL 94*  CO2 27  BUN 32*  CREATININE 6.46*  GLUCOSE 97  CALCIUM 8.9   No results for input(s): LABPT, INR in the last 72 hours.  Assessment/Plan: Plan is to keep patient here today.  Placement issues are currently in play.  Medically her somnolence has improved somewhat compared to yesterday.  We will try to get a disposition on her tomorrow   [name] 10/12/2016, 10:16 AM

## 2016-10-12 NOTE — Progress Notes (Signed)
[facility] Progress Note   Subjective: More alert today. Making jokes No c/os   Vitals:   10/11/16 1220 10/11/16 1300 10/11/16 2018 10/12/16 0626  BP: (!) 95/53 (!) 91/55 126/73 118/72  Pulse: 71 76 77 90  Resp: 14 16    Temp: 99 F (37.2 C) 97.3 F (36.3 C) 99.4 F (37.4 C) (!) 100.4 F (38 C)  TempSrc: Oral Oral Oral Oral  SpO2: 95% 98% 100% 99%  Weight: 124.5 kg (274 lb 7.6 oz)       Inpatient medications: . atorvastatin  40 mg Oral QHS  . calcitRIOL  0.5 mcg Oral Q T,Th,Sa-HD  . clopidogrel  75 mg Oral Daily  . darbepoetin (ARANESP) injection - DIALYSIS  200 mcg Intravenous Q Thu-HD  . DULoxetine  30 mg Oral Daily  . feeding supplement (NEPRO CARB STEADY)  237 mL Oral BID BM  . feeding supplement (PRO-STAT SUGAR FREE 64)  30 mL Oral BID  . ferric gluconate (FERRLECIT/NULECIT) IV  62.5 mg Intravenous Q Thu-HD  . hydrOXYzine  25 mg Oral Q6H  . insulin aspart  0-9 Units Subcutaneous TID WC  . insulin aspart  3 Units Subcutaneous TID WC  . insulin glargine  10 Units Subcutaneous QHS  . multivitamin  1 tablet Oral QHS  . pregabalin  50 mg Oral TID  . sevelamer carbonate  3,200 mg Oral TID WC    acetaminophen **OR** acetaminophen, albuterol, budesonide, methocarbamol **OR** methocarbamol (ROBAXIN)  IV, metoCLOPramide **OR** metoCLOPramide (REGLAN) injection, ondansetron **OR** ondansetron (ZOFRAN) IV, oxyCODONE  Exam: General: WDWN AAF NAD Lungs: CTA bilaterally  Heart: RRR with S1 S2.  Abdomen: soft NT + BS Lower extremities: bilat transmet amputation dressed with wound VAC Neuro: A & O  X 3.  Dialysis Access: LUE AVF +thrill/bruit   Dialysis Orders:  [facility] TTS 4h  120kg   2/2 bath  Hep 3600   LUE AVF Calcitriol 0.5 mcg PO 3x q week  Aranesp 200 mcg IV q week (last 1/4) Venofer 50 mg IV q week (last 1/4) OP Labs: Hgb 9.9 Tsat 28% Ca 9.2 Ph 7.1 PTH 193   Assessment: 1.  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  Hypotension - low BP  during admit -  dc'd isordil and coreg for now, and ^'d Robaxin interval 3.  ESRD -  TTS HD 4.  Volume - is up 3-4 kg but BP's too low for UF on 1/13 - follow  5.  Anemia  - Hgb 10.3 Cont OP ESA/Fe 6.  Metabolic bone disease -  Cont VDRA/binders - P^ 7.  Nutrition - renal diet/vitamins/protein supp  8. DM - per primary   Plan - as above   Tomasa Blase PA-C [facility] Pager [phone] 10/12/2016,10:57 AM  Pt seen, examined and agree w A/P as above.  Vinson Moselle MD [facility] pager [phone]   10/12/2016, 11:41 AM      Recent Labs Lab 10/08/16 1033 10/09/16 0709 10/11/16 0800  NA 138 138 136  K 3.6 4.5 4.5  CL 97* 97* 94*  CO2 28 26 27   GLUCOSE 85 126* 97  BUN 22* 30* 32*  CREATININE 4.63* 5.68* 6.46*  CALCIUM 8.5* 8.7* 8.9  PHOS  --  8.1* 7.2*    Recent Labs Lab 10/09/16 0709 10/11/16 0800  ALBUMIN 3.0* 2.9*    Recent Labs Lab 10/08/16 1033 10/09/16 0709 10/11/16 0800  WBC 5.4 5.4 8.3  HGB 10.2* 9.8*  10.3*  HCT 32.1* 32.2* 33.1*  MCV 97.9 98.8 96.5  PLT 193 168 165   Iron/TIBC/Ferritin/ %Sat    Component Value Date/Time   IRON 52 02/05/2016 1032   TIBC 305 02/05/2016 1032   FERRITIN 392 (H) 02/05/2016 1032   IRONPCTSAT 17 02/05/2016 1032

## 2016-10-12 NOTE — Progress Notes (Signed)
Pt and I HAD AN EXTENSIVE CONVERSATION REGARDING HER D/C . SHE IS CONCERNED SHE IS NOT PREPARED TO D/C HOME . SHE FEELS SHE CAN BENEFIT FROM REHAB . SHE STATED MD TOLD HER SHE WOULD BE D/C'C TO REHAB SO THAT WAS EXPECTATION . However she realizes this is a financial concern as she does not have the insurance cover to cover rehab . She feels her independence would be very limited and she end up back hospitalized . We discussed getting cm involve to speak about other options or possible resources . I will pass this concern down to am rn and concult cm /ss if they are not already consulted .

## 2016-10-13 ENCOUNTER — Ambulatory Visit (HOSPITAL_COMMUNITY): Payer: Medicaid Other

## 2016-10-13 LAB — GLUCOSE, CAPILLARY
GLUCOSE-CAPILLARY: 95 mg/dL (ref 65–99)
GLUCOSE-CAPILLARY: 122 mg/dL — AB (ref 65–99)

## 2016-10-13 NOTE — Discharge Summary (Signed)
No change in discharge summary Dr. [name] 10/10/16.

## 2016-10-13 NOTE — Progress Notes (Signed)
Subjective: "  I want a different  Diet ",  Dc pending ? Rehab /nh  Objective Vital signs in last 24 hours: Vitals:   10/12/16 2023 10/13/16 0607 10/13/16 0700 10/13/16 1032  BP: 136/79 124/69    Pulse: 77 73    Resp:      Temp: 98.6 F (37 C) 99.1 F (37.3 C)    TempSrc: Oral Oral    SpO2: 96% 93%  100%  Weight:   124 kg (273 lb 5.9 oz)    Weight change: -3.1 kg (-6 lb 13.4 oz)  Physical Exam:  General: Obese AAF NAD Lungs: CTA bilaterally  Heart:RRR with S1 S2.  Abdomen: Obese  .soft NT + BS Lower extremities:bilat transmet amputation  with wound VACs Dialysis Access: LUE AVF +bruit   OP Dialysis Orders:  [facility] TTS 4h  120kg   2/2 bath  Hep 3600   LUE AVF Calcitriol 0.5 mcg PO 3x q week  Aranesp 200 mcg IV q week (last 1/4) Venofer 50 mg IV q week (last 1/4) OP Labs: Hgb 9.9 Tsat 28% Ca 9.2 Ph 7.1 PTH 193    Problem/Plan 1. Gangrene bilat LE's - sp bilat TMA 1/11 DC per admit team  2.  Hypotension - low BP during admit -  dc'd isordil and coreg for now / 124/69 this am  3. ESRD- TTS HD on schedule  4.  Volume - is up 4 kg  (  BUT with bed scale  Hard to have accuracy to  eval volume )  ok  bp now and noted  BP's too low for UF on 1/13  5.  Anemia- Hgb 10.3 Cont OP ESA/Fe 6. Metabolic bone disease- Cont VDRA/binders - P^ missing doses in [place].( op noncompliance also noted )  7. Nutrition-  Alb 2.9 renal diet carb mod /vitamins/protein supp  8. DM - per primary   Beth Pastel, PA-C [facility] Beeper [phone] 10/13/2016,11:30 AM  LOS: 5 days   Labs: Basic Metabolic Panel:  Recent Labs Lab 10/08/16 1033 10/09/16 0709 10/11/16 0800  NA 138 138 136  K 3.6 4.5 4.5  CL 97* 97* 94*  CO2 28 26 27   GLUCOSE 85 126* 97  BUN 22* 30* 32*  CREATININE 4.63* 5.68* 6.46*  CALCIUM 8.5* 8.7* 8.9  PHOS  --  8.1* 7.2*   Liver Function Tests:  Recent Labs Lab 10/09/16 0709 10/11/16 0800  ALBUMIN 3.0* 2.9*   No results for  input(s): LIPASE, AMYLASE in the last 168 hours. No results for input(s): AMMONIA in the last 168 hours. CBC:  Recent Labs Lab 10/08/16 1033 10/09/16 0709 10/11/16 0800  WBC 5.4 5.4 8.3  HGB 10.2* 9.8* 10.3*  HCT 32.1* 32.2* 33.1*  MCV 97.9 98.8 96.5  PLT 193 168 165   Cardiac Enzymes: No results for input(s): CKTOTAL, CKMB, CKMBINDEX, TROPONINI in the last 168 hours. CBG:  Recent Labs Lab 10/12/16 0632 10/12/16 1146 10/12/16 1630 10/12/16 2031 10/13/16 0610  GLUCAP 78 113* 125* 96 95    Studies/Results: No results found. Medications:  . atorvastatin  40 mg Oral QHS  . calcitRIOL  0.5 mcg Oral Q T,Th,Sa-HD  . clopidogrel  75 mg Oral Daily  . darbepoetin (ARANESP) injection - DIALYSIS  200 mcg Intravenous Q Thu-HD  . DULoxetine  30 mg Oral Daily  . feeding supplement (NEPRO CARB STEADY)  237 mL Oral BID BM  . feeding supplement (PRO-STAT SUGAR FREE 64)  30 mL Oral BID  .  ferric gluconate (FERRLECIT/NULECIT) IV  62.5 mg Intravenous Q Thu-HD  . hydrOXYzine  25 mg Oral Q6H  . insulin aspart  0-9 Units Subcutaneous TID WC  . insulin aspart  3 Units Subcutaneous TID WC  . insulin glargine  10 Units Subcutaneous QHS  . multivitamin  1 tablet Oral QHS  . pregabalin  50 mg Oral TID  . sevelamer carbonate  3,200 mg Oral TID WC

## 2016-10-13 NOTE — Progress Notes (Signed)
Called Dr. Audrie Lia office about patient's discharge.  I was informed he is out of town until Thursday, however [name] will come see patient today.

## 2016-10-13 NOTE — Progress Notes (Signed)
Physical Therapy Treatment Patient Details Name: [name] MRN: [id number] DOB: 07-Feb-1981 Today's Date: 10/13/2016    History of Present Illness (P) Pt is a 36 y.o. 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 Pt now s/p transmetatarsal amputationbilaterally 10/08/16. PMH: diabetes, PVD, MI, HTN, CAD, COPD, CKD.    PT Comments    Patient continues to progress toward mobility goals and was min guard/supervision overall this session. Session focused on OOB transfers and w/c management. Continue to progress as tolerated with anticipated d/c home with family assistance.   Follow Up Recommendations  (P) Home health PT;Supervision/Assistance - 24 hour     Equipment Recommendations  (P) 3in1 (PT);Wheelchair (measurements PT);Wheelchair cushion (measurements PT);Other (comment) (slide board)    Recommendations for Other Services       Precautions / Restrictions Precautions Precautions: (P) Fall Restrictions Weight Bearing Restrictions: (P) Yes RLE Weight Bearing: Non weight bearing LLE Weight Bearing: Non weight bearing    Mobility  Bed Mobility Overal bed mobility: (P) Independent Bed Mobility: (P) Supine to Sit;Sit to Supine              Transfers Overall transfer level: (P) Needs assistance Equipment used: (P) None (sliding board) Transfers: (P) Lateral/Scoot Transfers          Lateral/Scoot Transfers: (P) Min guard;Supervision General transfer comment: (P) lateral transfer bed to w/c without slide board with supervision; w/c to bed with slide board and min guard for safety; cues for hand placement, technique, and safety each trial  Ambulation/Gait                 Administrator mobility: (P) Yes Wheelchair propulsion: (P) Both upper extremities Wheelchair parts: (P) Needs assistance Distance:  (P) 100 Wheelchair Assistance Details (indicate cue type and reason): (P) reviewed w/c parts, safe use, and propulsion with pt and then family present end of session in room; cues for turning and energy conservation but no physical assistance required in mobilizing w/c  Modified Rankin (Stroke Patients Only)       Balance Overall balance assessment: (P) Needs assistance Sitting-balance support: (P) No upper extremity supported Sitting balance-Leahy Scale: (P) Good Sitting balance - Comments: (P) good stability sitting EOB                            Cognition Arousal/Alertness: (P) Awake/alert Behavior During Therapy: (P) WFL for tasks assessed/performed Overall Cognitive Status: (P) Within Functional Limits for tasks assessed                      Exercises      General Comments        Pertinent Vitals/Pain Pain Assessment: (P) Faces Faces Pain Scale: (P) Hurts even more Pain Location: (P) bilat LE when in dependent position Pain Descriptors / Indicators: (P) Aching;Grimacing;Throbbing Pain Intervention(s): (P) Limited activity within patient's tolerance;Monitored during session;Premedicated before session;Repositioned    Home Living                      Prior Function            PT Goals (current goals can now be found in the care plan section) Acute Rehab PT Goals Patient Stated Goal: (P) none stated Progress towards PT goals: (P) Progressing toward goals  Frequency    (P) Min 2X/week      PT Plan (P) Current plan remains appropriate    Co-evaluation             End of Session Equipment Utilized During Treatment: (P) Gait belt Activity Tolerance: (P) Patient tolerated treatment well Patient left: (P) in bed;with call bell/phone within reach;with family/visitor present     Time: (P) 1042-(P) 1119 PT Time Calculation (min) (ACUTE ONLY): (P) 37 min  Charges:  $Therapeutic Activity: (P) 8-22 mins $Wheel Chair  Management: (P) 8-22 mins                    G Codes:      Derek Mound, PTA Pager: [phone]   10/13/2016, 12:57 PM

## 2016-10-13 NOTE — Care Management (Signed)
Case manager spoke with patient concerning discharge plan. Explained that information had been provided to her significant other on last week concerning getting a ramp for home. Patient very irate that she is being discharged with"no way to get in and out of the house". Case manager did contact Mr. Archie Patten at the [employer] to get information on ramp rental and informed the patient that it could be $160.00/ month depending on size. Patient stated she had a steep hill to access to get into her home, CM asked that she send picture so that it could be forwarded to [employer]. Patient has boyfriend, mom and daughter to assist at discharge. CM has arranged for [facility] to transport home. She will not receive Home Health therapy, CM discussed this with hospital therapist who states patient should do well independently, transfer board has been ordered and delivered to patient's room, wheel chair was delivered last week.  Vance Peper, RN BSN Case Manager [phone]

## 2016-10-13 NOTE — Progress Notes (Signed)
Reviewed discharge instructions/medications with patient.  Case Manager provider her with information about getting a ramp placed at her home.  Removed patient's IV. Patient is being picked up by [facility].

## 2016-10-13 NOTE — Progress Notes (Signed)
Subjective: 5 Days Post-Op Procedure(s) (LRB): Bilateral Transmetatarsal Amputation (Bilateral) Patient reports pain as mild.  Concerns about getting into her home in wheelchair.   Objective: Vital signs in last 24 hours: Temp:  [98 F (36.7 C)-99.1 F (37.3 C)] 99.1 F (37.3 C) (01/15 0607) Pulse Rate:  [73-77] 73 (01/15 0607) Resp:  [16] 16 (01/14 1324) BP: (124-136)/(69-79) 124/69 (01/15 0607) SpO2:  [93 %-100 %] 100 % (01/15 1032) Weight:  [273 lb 5.9 oz (124 kg)] 273 lb 5.9 oz (124 kg) (01/15 0700)  Intake/Output from previous day: 01/14 0701 - 01/15 0700 In: 720 [P.O.:720] Out: 0  Intake/Output this shift: No intake/output data recorded.   Recent Labs  10/11/16 0800  HGB 10.3*    Recent Labs  10/11/16 0800  WBC 8.3  RBC 3.43*  HCT 33.1*  PLT 165    Recent Labs  10/11/16 0800  NA 136  K 4.5  CL 94*  CO2 27  BUN 32*  CREATININE 6.46*  GLUCOSE 97  CALCIUM 8.9   No results for input(s): LABPT, INR in the last 72 hours. Alert and oriented.  Bilateral feet wound vacs in place .   Assessment/Plan: 5 Days Post-Op Procedure(s) (LRB): Bilateral Transmetatarsal Amputation (Bilateral) Discharge home once home DME needs met.  Non weight bearing bilateral lower extremities.   GILBERT CLARK 10/13/2016, 12:39 PM

## 2016-10-13 NOTE — Progress Notes (Signed)
Nutrition Follow-up  DOCUMENTATION CODES:   Not applicable  INTERVENTION:  Continue 30 ml Prostat po BID, each supplement provides 100 kcal and 15 grams of protein.   Continue Nepro Shake po BID, each supplement provides 425 kcal and 19 grams protein.  Plans for discharge today.   NUTRITION DIAGNOSIS:   Increased nutrient needs related to chronic illness, wound healing as evidenced by estimated needs; ongoing  GOAL:   Patient will meet greater than or equal to 90% of their needs; met  MONITOR:   PO intake, Supplement acceptance, Labs, Weight trends, Skin, I & O's  REASON FOR ASSESSMENT:   Malnutrition Screening Tool    ASSESSMENT:   36 y.o. female with ESRD on hemodialysis TTS at [facility]. PMH also includes DM with neuropathy,  HTN, severe PVD. 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 Pt with bilateral ischemic foot wounds, poorly healing.  She underwent bilateral transmetatarsal amputation.   Pt reports having a good appetite and eating well (noted pt was consuming bojangles during time of visit). Meal completion has been 50-100%. Pt reports her meal intake has been varied as she dislikes the renal diet she is on. Pt reports watching what she eats at home in moderation and limited sugar sweetened beverages and snacks. Pt currently has Nepro shake and Prostat orderd with varied consumption. RD to continue with current orders to aid in wound healing. Pt educated on the importance of adequate protein intake. Plans for discharge home today.  Pt with no observed significant fat or muscle mass loss.   Diet Order:  Diet - low sodium heart healthy Diet - low sodium heart healthy Diet renal/carb modified with fluid restriction Diet-HS Snack? Nothing; Room service appropriate? Yes; Fluid consistency: Thin  Skin:  Wound (see comment) (Incision bilateral foot with wound VAC)  Last BM:  1/14  Height:   Ht Readings from Last 1  Encounters:  10/01/16 _0  (1.727 m)    Weight:   Wt Readings from Last 1 Encounters:  10/13/16 273 lb 5.9 oz (124 kg)    Ideal Body Weight:  60 kg (adjusted for bilat transmetatarsal foot amp)  BMI:  Body mass index is 41.57 kg/m.  Estimated Nutritional Needs:   Kcal:  1900-2100  Protein:  105-120 grams  Fluid:  1.2 L/day  EDUCATION NEEDS:   No education needs identified at this time  Corrin Parker, MS, RD, LDN Pager # [phone] After hours/ weekend pager # [phone]

## 2016-10-14 ENCOUNTER — Encounter (HOSPITAL_COMMUNITY): Payer: Medicaid Other

## 2016-10-16 ENCOUNTER — Encounter: Payer: Medicaid Other | Admitting: Vascular Surgery

## 2016-10-17 ENCOUNTER — Telehealth (INDEPENDENT_AMBULATORY_CARE_PROVIDER_SITE_OTHER): Payer: Self-pay | Admitting: *Deleted

## 2016-10-17 NOTE — Telephone Encounter (Signed)
Pt called to make HFU and stated her wound vac is not working, just beeping.

## 2016-10-20 ENCOUNTER — Ambulatory Visit (INDEPENDENT_AMBULATORY_CARE_PROVIDER_SITE_OTHER): Payer: Medicaid Other | Admitting: Orthopedic Surgery

## 2016-10-20 ENCOUNTER — Telehealth (INDEPENDENT_AMBULATORY_CARE_PROVIDER_SITE_OTHER): Payer: Self-pay | Admitting: Orthopedic Surgery

## 2016-10-20 NOTE — Telephone Encounter (Signed)
Yisroel Ramming from [facility] called stating pt stated she needed PT and OT at home. Also called pt and got her scheduled for today at 3:45.

## 2016-10-20 NOTE — Telephone Encounter (Signed)
I called left voicemail. I was not in office when she called. Asked she please call office back so we can get her an appointment as soon as possible. Wound vac malfunction could cause breakdown of skin, having drainage sit on skin for several days. Needs this evaluated as soon as possible.

## 2016-10-20 NOTE — Telephone Encounter (Signed)
Patient states wound vac not working, just beeping.

## 2016-10-20 NOTE — Telephone Encounter (Signed)
Will follow up with them after patients appt tomorrow, unable to come in office today due to patient transportation.

## 2016-10-21 ENCOUNTER — Ambulatory Visit (INDEPENDENT_AMBULATORY_CARE_PROVIDER_SITE_OTHER): Payer: Medicaid Other | Admitting: Orthopedic Surgery

## 2016-10-21 VITALS — Ht 68.0 in | Wt 273.0 lb

## 2016-10-21 DIAGNOSIS — Z89432 Acquired absence of left foot: Secondary | ICD-10-CM

## 2016-10-21 DIAGNOSIS — Z89431 Acquired absence of right foot: Secondary | ICD-10-CM

## 2016-10-21 NOTE — Progress Notes (Signed)
Office Visit Note   Patient: [name]           Date of Birth: 13-Feb-1981           MRN: [id number] Visit Date: 10/21/2016              Requested by: [name], FNP [address] [place], [place] [address] PCP: [name], FNP  Chief Complaint  Patient presents with  . Left Foot - Routine Post Op    10/08/16 bilateral transmetatarsal amputations with wound vac placement.   . Right Foot - Routine Post Op    HPI: Patient is s/p bilateral transmet amputations. She had wound vac application and states that the vac stopped working last week and that she removed the pump but left the sponge on bilaterally. She is non weight bearing in a wheelchair. There is slight maceration and the stitches are intact. There is some swelling  And the incision is pulling slightly. Small amount of bloody drainage. [name], RMA    Assessment & Plan: Visit Diagnoses:  1. Status post transmetatarsal amputation of right foot (HCC)   2. Status post transmetatarsal amputation of left foot (HCC)     Plan: Start dry dressing changes daily continue protected weightbearing start Dial soap cleansing daily follow-up in 2 weeks to remove the sutures.  Follow-Up Instructions: Return in about 2 weeks (around 11/04/2016).   Ortho Exam On examination the wound vacs are removed. Patient has had excellent interval healing of the transmetatarsal amputations there is no cellulitis no odor no drainage no maceration no signs of infection.  Imaging: No results found.  Orders:  No orders of the defined types were placed in this encounter.  No orders of the defined types were placed in this encounter.    Procedures: No procedures performed  Clinical Data: No additional findings.  Subjective: Review of Systems  Objective: Vital Signs: Ht 5\' 8"  (1.727 m)   Wt 273 lb (123.8 kg)   BMI 41.51 kg/m   Specialty Comments:  No specialty comments available.  PMFS History: Patient  Active Problem List   Diagnosis Date Noted  . Status post transmetatarsal amputation of left foot (HCC) 10/21/2016  . Status post transmetatarsal amputation of right foot (HCC) 10/08/2016  . 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 Great toe amputation status, right (HCC) 08/01/2016  . Diabetic osteomyelitis (HCC)   . Gangrene (HCC)   . 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 Diabetic foot infection (HCC) 06/20/2016  . ESRD (end stage renal disease) on dialysis (HCC) 06/20/2016  . Diabetic foot ulcer (HCC) 06/20/2016  . Pre-operative clearance 10/12/2015  . Normal coronary arteries 10/12/2015  . NSTEMI- type 2, Troponin 11.2 05/11/2015  . Respiratory failure requiring intubation (HCC) 05/10/2015  . History of arthroscopy of right shoulder-05/10/15 05/10/2015  . CKD (chronic kidney disease) stage 5, GFR less than 15 ml/min (HCC) 09/21/2014  . Unspecified asthma(493.90) 10/25/2013  . Acute combined systolic and diastolic heart failure (HCC) 09/21/2013  . Non-ischemic cardiomyopathy - by echo 8/16- EF 35-40% 09/16/2013  . Morbid obesity-  09/16/2013  . 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) 09/15/2013  . Normocytic anemia 09/15/2013  . Lower extremity edema 09/15/2013  . Hyperlipidemia   . Hypertension    Past Medical History:  Diagnosis Date  . Anemia   . Arthritis    "left hand" (09/15/2013)  . Asthma   .  Chronic bronchitis (HCC)    "just about q yr" (09/15/2013)  . Chronic kidney disease    "low kidney function" (09/15/2013) ,  T/Th/Sa  . COPD (chronic obstructive pulmonary disease) (HCC)   . Coronary artery disease   . Hyperlipidemia   . Hypertension   . Migraine    "get them alot" (09/15/2013)  . Myocardial infarction 04/2015   NSTEMI  . Normal coronary arteries    by cardiac catheterization 09/20/13  . Peripheral vascular disease (HCC)   . Pneumonia    "couple times; have it now"  (09/15/2013)  . PONV (postoperative nausea and vomiting)   . Restless legs   . Shortness of breath    "just recently; related to the pneumonia" (09/15/2013)  . Type 1 diabetes mellitus (HCC)    type 2    Family History  Problem Relation Age of Onset  . Diabetes Mother   . Stroke Mother   . Hypertension Father   . Hyperlipidemia Father   . Cancer - Lung Father   . Diabetes Maternal Grandmother   . Cancer Paternal Grandmother     Past Surgical History:  Procedure Laterality Date  . AMPUTATION Right 06/25/2016   Procedure: Amputation Right Great Toe at the Metatarsophalangeal Joint;  Surgeon: Nadara Mustard, MD;  Location[facility];  Service: Orthopedics;  Laterality: Right;  . AMPUTATION Bilateral 10/08/2016   Procedure: Bilateral Transmetatarsal Amputation;  Surgeon: Nadara Mustard, MD;  Location: [facility] OR;  Service: Orthopedics;  Laterality: Bilateral;  . AV FISTULA PLACEMENT Left 03/27/2015   Procedure: CREATION RADIAL CEPHALIC ARTERIOVENOUS FISTULA;  Surgeon: Chuck Hint, MD;  Location: [facility] OR;  Service: Vascular;  Laterality: Left;  . AV FISTULA PLACEMENT Left 11/23/2015   Procedure:  LEFT ARM BASILIC VEIN TRANSPOSITION  ;  Surgeon: Chuck Hint, MD;  Location: [facility] OR;  Service: Vascular;  Laterality: Left;  . CESAREAN SECTION  1999; 2006  . CORONARY ANGIOGRAM  09/20/2013   Procedure: CORONARY ANGIOGRAM;  Surgeon: Runell Gess, MD;  Location: [facility] CATH LAB;  Service: Cardiovascular;;  . FINGER SURGERY Left 1985   3rd and 4th digits reconstructed after cut off" (09/15/2013)  . PERIPHERAL VASCULAR CATHETERIZATION N/A 09/05/2016   Procedure: Abdominal Aortogram;  Surgeon: Sherren Kerns, MD;  Location: [facility] INVASIVE CV LAB;  Service: Cardiovascular;  Laterality: N/A;  . PERIPHERAL VASCULAR CATHETERIZATION Bilateral 09/05/2016   Procedure: Lower Extremity Angiography;  Surgeon: Sherren Kerns, MD;  Location: [facility] INVASIVE CV LAB;  Service: Cardiovascular;  Laterality:  Bilateral;  . PERIPHERAL VASCULAR CATHETERIZATION Right 09/05/2016   Procedure: Peripheral Vascular Balloon Angioplasty;  Surgeon: Sherren Kerns, MD;  Location: [facility] INVASIVE CV LAB;  Service: Cardiovascular;  Laterality: Right;  peroneal and AT  . SHOULDER ARTHROSCOPY WITH BICEPSTENOTOMY Right 05/10/2015   Procedure: RIGHT SHOULDER ARTHROSCOPY WITH BICEPS TENOTOMY, DEBRIDEMENT LABRAL TEAR;  Surgeon: Jones Broom, MD;  Location: [facility] OR;  Service: Orthopedics;  Laterality: Right;  Right shoulder arthroscopy biceps tenotomy, debridement labral tear  . TONSILLECTOMY  1997  . TUBAL LIGATION  2006   Social History   Occupational History  . Student    Social History Main Topics  . Smoking status: Never Smoker  . Smokeless tobacco: Never Used  . Alcohol use No  . Drug use: No  . Sexual activity: Not Currently    Partners: Male    Birth control/ protection: Other-see comments     Comment: S/P tubal ligation

## 2016-10-23 ENCOUNTER — Encounter: Payer: Medicaid Other | Admitting: Vascular Surgery

## 2016-10-23 NOTE — Telephone Encounter (Signed)
noted 

## 2016-10-27 ENCOUNTER — Telehealth (INDEPENDENT_AMBULATORY_CARE_PROVIDER_SITE_OTHER): Payer: Self-pay | Admitting: *Deleted

## 2016-10-27 NOTE — Telephone Encounter (Signed)
Pt requesting refill last refills were oxycodone 5 mg  #42 on 10/13/16, oxycodone 5/325 #20 10/04/16, oxycodone 5 mg #8 09/30/16, oxycodone 5/325 #16 09/28/16,

## 2016-10-27 NOTE — Telephone Encounter (Signed)
Pt requesting oxycodone refill

## 2016-10-28 ENCOUNTER — Inpatient Hospital Stay (INDEPENDENT_AMBULATORY_CARE_PROVIDER_SITE_OTHER): Payer: Medicaid Other | Admitting: Orthopedic Surgery

## 2016-10-28 MED ORDER — OXYCODONE HCL 5 MG PO TABS: 5.0000 mg | ORAL_TABLET | Freq: Four times a day (QID) | ORAL | 0 refills | Status: DC | PRN

## 2016-10-28 NOTE — Telephone Encounter (Signed)
Ok, reduced to q6h from [employer]

## 2016-10-28 NOTE — Telephone Encounter (Signed)
Pt has an appt today. rx is written and will give to pt at appt today.

## 2016-10-31 ENCOUNTER — Telehealth (INDEPENDENT_AMBULATORY_CARE_PROVIDER_SITE_OTHER): Payer: Self-pay | Admitting: Orthopedic Surgery

## 2016-10-31 NOTE — Telephone Encounter (Signed)
Tiffany with [facility] called regarding Marbeth Rahming SCAT and personal care forms.  She is wanting to know if Dr. [name] has signed them.  AG#[phone].  Thank you.

## 2016-11-03 NOTE — Telephone Encounter (Signed)
Do you have paperwork for this pt?

## 2016-11-05 ENCOUNTER — Ambulatory Visit (INDEPENDENT_AMBULATORY_CARE_PROVIDER_SITE_OTHER): Payer: Medicaid Other | Admitting: Orthopedic Surgery

## 2016-11-06 ENCOUNTER — Encounter (INDEPENDENT_AMBULATORY_CARE_PROVIDER_SITE_OTHER): Payer: Self-pay | Admitting: Orthopedic Surgery

## 2016-11-06 ENCOUNTER — Ambulatory Visit (INDEPENDENT_AMBULATORY_CARE_PROVIDER_SITE_OTHER): Payer: Medicaid Other | Admitting: Family

## 2016-11-06 VITALS — Ht 68.0 in | Wt 273.0 lb

## 2016-11-06 DIAGNOSIS — Z89432 Acquired absence of left foot: Secondary | ICD-10-CM

## 2016-11-06 DIAGNOSIS — Z89431 Acquired absence of right foot: Secondary | ICD-10-CM

## 2016-11-06 DIAGNOSIS — M62838 Other muscle spasm: Secondary | ICD-10-CM

## 2016-11-06 MED ORDER — METHOCARBAMOL 500 MG PO TABS: 500.0000 mg | ORAL_TABLET | Freq: Four times a day (QID) | ORAL | 0 refills | Status: DC

## 2016-11-06 MED ORDER — OXYCODONE HCL 5 MG PO TABS: 5.0000 mg | ORAL_TABLET | Freq: Three times a day (TID) | ORAL | 0 refills | Status: DC | PRN

## 2016-11-06 NOTE — Progress Notes (Signed)
Office Visit Note   Patient: [name]           Date of Birth: [birth year]           MRN: [id number] Visit Date: 11/06/2016              Requested by: [name], FNP [address] [place], [place] [address] PCP: [name], FNP  Chief Complaint  Patient presents with  . Right Foot - Routine Post Op    10/08/16 bilateral transmetatarsal amputations with wound vac placement.    . Left Foot - Routine Post Op    10/08/16 bilateral transmetatarsal amputations with wound vac placement.      HPI: Patient is 36 y.o female presenting for one week follow up bilateral transmetatarsal amputations. She is approximately four weeks post op. There is no drainage sutures are intact. Patient has been having increased pain due to weather. She complains of muscle spasms at the bottom of her feet. She is taking oxycodone 5mg  for her pain every 6 hours. Donalee Citrin, RT  The patient is a 36 year old woman who presents today for weeks status post bilateral transmetatarsal amputations. She's been nonweightbearing in a wheelchair. Complaining of new muscle spasms and the bilateral feet. Plantar aspect. States the left foot is worse than the right has seen this gentleman from time to time she is taking 5 mg of oxycodone every 6 hours. States this takes the edge off but does not totally curative for pain. Has had a bad experience with Neurontin. 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    Assessment & Plan: Visit Diagnoses:  1. Status post transmetatarsal amputation of right foot (HCC)   2. Status post transmetatarsal amputation of left foot (HCC)   3. Muscle spasm     Plan: She will follow up in office in 2 more weeks. We have harvested the sutures today without incident. She'll continue with dry dressings daily following wound cleansing. Will him call [facility] and call him orders for physical therapy to begin working with her for ambulation training. May begin  touchdown weightbearing.  Follow-Up Instructions: Return in about 2 weeks (around 11/20/2016).   Ortho Exam Physical Exam  Constitutional: Appears well-developed.  Head: Normocephalic.  Eyes: EOM are normal.  Neck: Normal range of motion.  Cardiovascular: Normal rate.   Pulmonary/Chest: Effort normal.  Neurological: Is alert.  Skin: Skin is warm.  Psychiatric: Has a normal mood and affect. Bilateral transmetatarsal amputation incisions are well approximated with sutures are healing well. There is a small 1 cm length of the scar laterally on the right foot. Laterally on the left foot there are 2 little open areas both a centimeter long that have granulation tissue no depths. No drainage. No surrounding erythema no odor no sign of infection either feet.  Imaging: No results found.  Orders:  No orders of the defined types were placed in this encounter.  Meds ordered this encounter  Medications  . oxyCODONE (ROXICODONE) 5 MG immediate release tablet    Sig: Take 1 tablet (5 mg total) by mouth 3 (three) times daily as needed for breakthrough pain.    Dispense:  30 tablet    Refill:  0  . methocarbamol (ROBAXIN) 500 MG tablet    Sig: Take 1 tablet (500 mg total) by mouth 4 (four) times daily.    Dispense:  30 tablet    Refill:  0     Procedures: No procedures performed  Clinical Data:  No additional findings.  Subjective: Review of Systems  Constitutional: Negative for chills and fever.  Cardiovascular: Negative for leg swelling.  Skin: Negative for wound.    Objective: Vital Signs: Ht 5\' 8"  (1.727 m)   Wt 273 lb (123.8 kg)   BMI 41.51 kg/m   Specialty Comments:  No specialty comments available.  PMFS History: Patient Active Problem List   Diagnosis Date Noted  . Status post transmetatarsal amputation of left foot (HCC) 10/21/2016  . Status post transmetatarsal amputation of right foot (HCC) 10/08/2016  . 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 Great toe amputation status, right (HCC) 08/01/2016  . Diabetic osteomyelitis (HCC)   . Gangrene (HCC)   . 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 Diabetic foot infection (HCC) 06/20/2016  . ESRD (end stage renal disease) on dialysis (HCC) 06/20/2016  . Diabetic foot ulcer (HCC) 06/20/2016  . Pre-operative clearance 10/12/2015  . Normal coronary arteries 10/12/2015  . NSTEMI- type 2, Troponin 11.2 05/11/2015  . Respiratory failure requiring intubation (HCC) 05/10/2015  . History of arthroscopy of right shoulder-05/10/15 05/10/2015  . CKD (chronic kidney disease) stage 5, GFR less than 15 ml/min (HCC) 09/21/2014  . Unspecified asthma(493.90) 10/25/2013  . Acute combined systolic and diastolic heart failure (HCC) 09/21/2013  . Non-ischemic cardiomyopathy - by echo 8/16- EF 35-40% 09/16/2013  . Morbid obesity-  09/16/2013  . 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) 09/15/2013  . Normocytic anemia 09/15/2013  . Lower extremity edema 09/15/2013  . Hyperlipidemia   . Hypertension    Past Medical History:  Diagnosis Date  . Anemia   . Arthritis    "left hand" (09/15/2013)  . Asthma   . Chronic bronchitis (HCC)    "just about q yr" (09/15/2013)  . Chronic kidney disease    "low kidney function" (09/15/2013) ,  T/Th/Sa  . COPD (chronic obstructive pulmonary disease) (HCC)   . Coronary artery disease   . Hyperlipidemia   . Hypertension   . Migraine    "get them alot" (09/15/2013)  . Myocardial infarction 04/2015   NSTEMI  . Normal coronary arteries    by cardiac catheterization 09/20/13  . Peripheral vascular disease (HCC)   . Pneumonia    "couple times; have it now" (09/15/2013)  . PONV (postoperative nausea and vomiting)   . Restless legs   . Shortness of breath    "just recently; related to the pneumonia" (09/15/2013)  . Type 1 diabetes mellitus (HCC)    type 2    Family History  Problem Relation Age of  Onset  . Diabetes Mother   . Stroke Mother   . Hypertension Father   . Hyperlipidemia Father   . Cancer - Lung Father   . Diabetes Maternal Grandmother   . Cancer Paternal Grandmother     Past Surgical History:  Procedure Laterality Date  . AMPUTATION Right 06/25/2016   Procedure: Amputation Right Great Toe at the Metatarsophalangeal Joint;  Surgeon: Nadara Mustard, MD;  [facility] OR;  Service: Orthopedics;  Laterality: Right;  . AMPUTATION Bilateral 10/08/2016   Procedure: Bilateral Transmetatarsal Amputation;  Surgeon: Nadara Mustard, MD;  Location: [facility] OR;  Service: Orthopedics;  Laterality: Bilateral;  . AV FISTULA PLACEMENT Left 03/27/2015   Procedure: CREATION RADIAL CEPHALIC ARTERIOVENOUS FISTULA;  Surgeon: Chuck Hint, MD;  Location: [facility] OR;  Service: Vascular;  Laterality: Left;  . AV FISTULA PLACEMENT Left 11/23/2015  Procedure:  LEFT ARM BASILIC VEIN TRANSPOSITION  ;  Surgeon: Chuck Hint, MD;  Location: [facility] OR;  Service: Vascular;  Laterality: Left;  . CESAREAN SECTION  1999; 2006  . CORONARY ANGIOGRAM  09/20/2013   Procedure: CORONARY ANGIOGRAM;  Surgeon: Runell Gess, MD;  Location: [facility] CATH LAB;  Service: Cardiovascular;;  . FINGER SURGERY Left 1985   3rd and 4th digits reconstructed after cut off" (09/15/2013)  . PERIPHERAL VASCULAR CATHETERIZATION N/A 09/05/2016   Procedure: Abdominal Aortogram;  Surgeon: Sherren Kerns, MD;  Location: [facility] INVASIVE CV LAB;  Service: Cardiovascular;  Laterality: N/A;  . PERIPHERAL VASCULAR CATHETERIZATION Bilateral 09/05/2016   Procedure: Lower Extremity Angiography;  Surgeon: Sherren Kerns, MD;  Location: [facility] INVASIVE CV LAB;  Service: Cardiovascular;  Laterality: Bilateral;  . PERIPHERAL VASCULAR CATHETERIZATION Right 09/05/2016   Procedure: Peripheral Vascular Balloon Angioplasty;  Surgeon: Sherren Kerns, MD;  Location: [facility] INVASIVE CV LAB;  Service: Cardiovascular;  Laterality: Right;  peroneal and AT  . SHOULDER  ARTHROSCOPY WITH BICEPSTENOTOMY Right 05/10/2015   Procedure: RIGHT SHOULDER ARTHROSCOPY WITH BICEPS TENOTOMY, DEBRIDEMENT LABRAL TEAR;  Surgeon: Jones Broom, MD;  Location: [facility] OR;  Service: Orthopedics;  Laterality: Right;  Right shoulder arthroscopy biceps tenotomy, debridement labral tear  . TONSILLECTOMY  1997  . TUBAL LIGATION  2006   Social History   Occupational History  . Student    Social History Main Topics  . Smoking status: Never Smoker  . Smokeless tobacco: Never Used  . Alcohol use No  . Drug use: No  . Sexual activity: Not Currently    Partners: Male    Birth control/ protection: Other-see comments     Comment: S/P tubal ligation

## 2016-11-07 ENCOUNTER — Telehealth (INDEPENDENT_AMBULATORY_CARE_PROVIDER_SITE_OTHER): Payer: Self-pay | Admitting: *Deleted

## 2016-11-07 NOTE — Telephone Encounter (Signed)
Pt needs PA for pain medication.

## 2016-11-11 NOTE — Telephone Encounter (Signed)
Pt called back about this °

## 2016-11-11 NOTE — Telephone Encounter (Signed)
Sent information for recert for prior authorization for patients medication for oxycodone to [place] [facility], pending their approval. They unfortunately do not expedite cases.

## 2016-11-13 ENCOUNTER — Telehealth (INDEPENDENT_AMBULATORY_CARE_PROVIDER_SITE_OTHER): Payer: Self-pay | Admitting: Radiology

## 2016-11-13 DIAGNOSIS — Z89431 Acquired absence of right foot: Secondary | ICD-10-CM

## 2016-11-13 DIAGNOSIS — Z89432 Acquired absence of left foot: Secondary | ICD-10-CM

## 2016-11-13 NOTE — Telephone Encounter (Signed)
I called and spoke with [place] [facility], oxycodone medication was approved through 12/11/16. I called pharmacy to make them aware and she has already picked up at [facility] on [address].

## 2016-11-14 NOTE — Telephone Encounter (Signed)
Referral sent to [facility] 

## 2016-11-21 ENCOUNTER — Ambulatory Visit (INDEPENDENT_AMBULATORY_CARE_PROVIDER_SITE_OTHER): Payer: Medicaid Other | Admitting: Family

## 2016-11-21 ENCOUNTER — Encounter (INDEPENDENT_AMBULATORY_CARE_PROVIDER_SITE_OTHER): Payer: Self-pay | Admitting: Family

## 2016-11-21 VITALS — Ht 68.0 in | Wt 273.0 lb

## 2016-11-21 DIAGNOSIS — Z89431 Acquired absence of right foot: Secondary | ICD-10-CM

## 2016-11-21 DIAGNOSIS — L97411 Non-pressure chronic ulcer of right heel and midfoot limited to breakdown of skin: Secondary | ICD-10-CM

## 2016-11-21 DIAGNOSIS — E11621 Type 2 diabetes mellitus with foot ulcer: Secondary | ICD-10-CM

## 2016-11-21 DIAGNOSIS — L03116 Cellulitis of left lower limb: Secondary | ICD-10-CM

## 2016-11-21 DIAGNOSIS — Z89432 Acquired absence of left foot: Secondary | ICD-10-CM

## 2016-11-21 MED ORDER — COLLAGENASE 250 UNIT/GM EX OINT: 1.0000 "application " | TOPICAL_OINTMENT | Freq: Every day | CUTANEOUS | 0 refills | Status: DC

## 2016-11-21 MED ORDER — DOXYCYCLINE HYCLATE 100 MG PO TABS: 100.0000 mg | ORAL_TABLET | Freq: Two times a day (BID) | ORAL | 0 refills | Status: DC

## 2016-11-21 MED ORDER — OXYCODONE HCL 5 MG PO TABS: 5.0000 mg | ORAL_TABLET | Freq: Two times a day (BID) | ORAL | 0 refills | Status: DC | PRN

## 2016-11-21 NOTE — Progress Notes (Signed)
Office Visit Note   Patient: [name]           Date of Birth: 11/21/1980           MRN: [id number] Visit Date: 11/21/2016              Requested by: [name], FNP [address] [place], [place] [address] PCP: [name], FNP  Chief Complaint  Patient presents with  . Left Foot - Routine Post Op    Bilateral transmet amputations on 10/08/16  . Right Foot - Routine Post Op    HPI: Pt is a 37 year old woman seen s/p bilateral transmet amputations on 10/08/16. She in non weight bearing in a wheelchair and post op shoes. She states that the left foot is very painful. The foot is swollen and appears slightly pink. The medial side of the incision is open and there is spot amount of bloody drainage on the dressing. She is using silvadene dressing daily and is not on any ABX. She denies fever, chills or sweats and states that her PO intake has been normal. She is complaining of a significant increase in pain.     Assessment & Plan: Visit Diagnoses:  1. Cellulitis of left lower extremity   2. Diabetic ulcer of right heel associated with type 2 diabetes mellitus, limited to breakdown of skin (HCC)   3. Status post transmetatarsal amputation of right foot (HCC)   4. Status post transmetatarsal amputation of left foot (HCC)     Plan: Will apply santyl to left foot wounds daily following wound cleansing. Have provided prescriptions for Doxycycline and pain medication as well. Follow up in 2 weeks. Return precautions discussed.   Follow-Up Instructions: Return in about 2 weeks (around 12/05/2016).   Physical Exam  Constitutional: Appears well-developed.  Head: Normocephalic.  Eyes: EOM are normal.  Neck: Normal range of motion.  Cardiovascular: Normal rate.   Pulmonary/Chest: Effort normal.  Neurological: Is alert.  Skin: Skin is warm.  Psychiatric: Has a normal mood and affect. Right foot: transmetatarsal amputation is well healed. No erythema. No open areas.  No sign of infection. Left foot: Lateral aspect of transmetatarsal amputation has yet to heal. This had dehisced. Wound filled in with fibrinous exudative tissue. There is some eschar medially. Erythema and tenderness surrounding lateral wound and foot. No ascending cellulitis. Scant clear drainage. Ortho Exam   Imaging: No results found.  Orders:  No orders of the defined types were placed in this encounter.  Meds ordered this encounter  Medications  . oxyCODONE (ROXICODONE) 5 MG immediate release tablet    Sig: Take 1 tablet (5 mg total) by mouth 2 (two) times daily as needed for breakthrough pain.    Dispense:  20 tablet    Refill:  0  . doxycycline (VIBRA-TABS) 100 MG tablet    Sig: Take 1 tablet (100 mg total) by mouth 2 (two) times daily.    Dispense:  60 tablet    Refill:  0  . collagenase (SANTYL) ointment    Sig: Apply 1 application topically daily.    Dispense:  15 g    Refill:  0     Procedures: No procedures performed  Clinical Data: No additional findings.  Subjective: Review of Systems  Constitutional: Negative for chills and fever.  Cardiovascular: Negative for leg swelling.  Musculoskeletal: Positive for gait problem.  Skin: Positive for color change and wound.    Objective: Vital Signs: Ht 5\' 8"  (1.727  m)   Wt 273 lb (123.8 kg)   BMI 41.51 kg/m   Specialty Comments:  No specialty comments available.  PMFS History: Patient Active Problem List   Diagnosis Date Noted  . Status post transmetatarsal amputation of left foot (HCC) 10/21/2016  . Status post transmetatarsal amputation of right foot (HCC) 10/08/2016  . 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 Diabetic osteomyelitis (HCC)   . 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 Diabetic foot infection (HCC) 06/20/2016  . ESRD (end stage renal disease) on dialysis (HCC) 06/20/2016  . Diabetic foot ulcer (HCC) 06/20/2016  .  Pre-operative clearance 10/12/2015  . Normal coronary arteries 10/12/2015  . NSTEMI- type 2, Troponin 11.2 05/11/2015  . Respiratory failure requiring intubation (HCC) 05/10/2015  . History of arthroscopy of right shoulder-05/10/15 05/10/2015  . CKD (chronic kidney disease) stage 5, GFR less than 15 ml/min (HCC) 09/21/2014  . Unspecified asthma(493.90) 10/25/2013  . Acute combined systolic and diastolic heart failure (HCC) 09/21/2013  . Non-ischemic cardiomyopathy - by echo 8/16- EF 35-40% 09/16/2013  . Morbid obesity-  09/16/2013  . 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) 09/15/2013  . Normocytic anemia 09/15/2013  . Lower extremity edema 09/15/2013  . Hyperlipidemia   . Hypertension    Past Medical History:  Diagnosis Date  . Anemia   . Arthritis    "left hand" (09/15/2013)  . Asthma   . Chronic bronchitis (HCC)    "just about q yr" (09/15/2013)  . Chronic kidney disease    "low kidney function" (09/15/2013) ,  T/Th/Sa  . COPD (chronic obstructive pulmonary disease) (HCC)   . Coronary artery disease   . Hyperlipidemia   . Hypertension   . Migraine    "get them alot" (09/15/2013)  . Myocardial infarction 04/2015   NSTEMI  . Normal coronary arteries    by cardiac catheterization 09/20/13  . Peripheral vascular disease (HCC)   . Pneumonia    "couple times; have it now" (09/15/2013)  . PONV (postoperative nausea and vomiting)   . Restless legs   . Shortness of breath    "just recently; related to the pneumonia" (09/15/2013)  . Type 1 diabetes mellitus (HCC)    type 2    Family History  Problem Relation Age of Onset  . Diabetes Mother   . Stroke Mother   . Hypertension Father   . Hyperlipidemia Father   . Cancer - Lung Father   . Diabetes Maternal Grandmother   . Cancer Paternal Grandmother     Past Surgical History:  Procedure Laterality Date  . AMPUTATION Right 06/25/2016   Procedure: Amputation Right Great Toe at the Metatarsophalangeal  Joint;  Surgeon: Nadara Mustard, MD;  Location: [facility] OR;  Service: Orthopedics;  Laterality: Right;  . AMPUTATION Bilateral 10/08/2016   Procedure: Bilateral Transmetatarsal Amputation;  Surgeon: Nadara Mustard, MD;  Location: [facility] OR;  Service: Orthopedics;  Laterality: Bilateral;  . AV FISTULA PLACEMENT Left 03/27/2015   Procedure: CREATION RADIAL CEPHALIC ARTERIOVENOUS FISTULA;  Surgeon: Chuck Hint, MD;  Location: [facility] OR;  Service: Vascular;  Laterality: Left;  . AV FISTULA PLACEMENT Left 11/23/2015   Procedure:  LEFT ARM BASILIC VEIN TRANSPOSITION  ;  Surgeon: Chuck Hint, MD;  Location: [facility] OR;  Service: Vascular;  Laterality: Left;  . CESAREAN SECTION  1999; 2006  . CORONARY ANGIOGRAM  09/20/2013   Procedure: CORONARY ANGIOGRAM;  Surgeon: Runell Gess, MD;  Location: [facility] CATH LAB;  Service: Cardiovascular;;  . FINGER SURGERY Left 1985   3rd and 4th digits reconstructed after cut off" (09/15/2013)  . PERIPHERAL VASCULAR CATHETERIZATION N/A 09/05/2016   Procedure: Abdominal Aortogram;  Surgeon: Sherren Kerns, MD;  Location: [facility] INVASIVE CV LAB;  Service: Cardiovascular;  Laterality: N/A;  . PERIPHERAL VASCULAR CATHETERIZATION Bilateral 09/05/2016   Procedure: Lower Extremity Angiography;  Surgeon: Sherren Kerns, MD;  Location: [facility] INVASIVE CV LAB;  Service: Cardiovascular;  Laterality: Bilateral;  . PERIPHERAL VASCULAR CATHETERIZATION Right 09/05/2016   Procedure: Peripheral Vascular Balloon Angioplasty;  Surgeon: Sherren Kerns, MD;  Location: [facility] INVASIVE CV LAB;  Service: Cardiovascular;  Laterality: Right;  peroneal and AT  . SHOULDER ARTHROSCOPY WITH BICEPSTENOTOMY Right 05/10/2015   Procedure: RIGHT SHOULDER ARTHROSCOPY WITH BICEPS TENOTOMY, DEBRIDEMENT LABRAL TEAR;  Surgeon: Jones Broom, MD;  Location: [facility] OR;  Service: Orthopedics;  Laterality: Right;  Right shoulder arthroscopy biceps tenotomy, debridement labral tear  . TONSILLECTOMY  1997  . TUBAL LIGATION  2006     Social History   Occupational History  . Student    Social History Main Topics  . Smoking status: Never Smoker  . Smokeless tobacco: Never Used  . Alcohol use No  . Drug use: No  . Sexual activity: Not Currently    Partners: Male    Birth control/ protection: Other-see comments     Comment: S/P tubal ligation

## 2016-11-27 ENCOUNTER — Other Ambulatory Visit (INDEPENDENT_AMBULATORY_CARE_PROVIDER_SITE_OTHER): Payer: Self-pay | Admitting: Orthopedic Surgery

## 2016-11-27 ENCOUNTER — Telehealth (INDEPENDENT_AMBULATORY_CARE_PROVIDER_SITE_OTHER): Payer: Self-pay | Admitting: Orthopedic Surgery

## 2016-11-27 MED ORDER — OXYCODONE HCL 5 MG PO TABS: 5.0000 mg | ORAL_TABLET | Freq: Two times a day (BID) | ORAL | 0 refills | Status: DC | PRN

## 2016-11-27 NOTE — Telephone Encounter (Signed)
I called and spoke with patient, she is having it picked up in approximately 20 minutes.

## 2016-11-27 NOTE — Telephone Encounter (Signed)
Pt is requesting a refill on oxycodone last refills were 11/21/16 #20 ans 11/12/16 #30 pt is s/p bilateral transmet amputations.

## 2016-11-27 NOTE — Telephone Encounter (Signed)
Patient called to get a refill on oxycodone. Will be back up by [facility]. CB# [phone]

## 2016-11-27 NOTE — Telephone Encounter (Signed)
rx written

## 2016-12-03 ENCOUNTER — Telehealth (INDEPENDENT_AMBULATORY_CARE_PROVIDER_SITE_OTHER): Payer: Self-pay | Admitting: *Deleted

## 2016-12-03 NOTE — Telephone Encounter (Signed)
Von called about SCAT forms that were sent to our office to be filled out for pt. They need to be faxed back to [facility] FAX: [phone], or [phone], or [phone]. Von stated this original message was sent over a month ago.

## 2016-12-04 NOTE — Telephone Encounter (Signed)
Faxed to all numbers provided below

## 2016-12-04 NOTE — Telephone Encounter (Signed)
I don't have a form or a request for her. I don't think I would be the one to fill out a SCAT form. I don't know what they look like.

## 2016-12-05 ENCOUNTER — Ambulatory Visit (INDEPENDENT_AMBULATORY_CARE_PROVIDER_SITE_OTHER): Payer: Medicaid Other | Admitting: Orthopedic Surgery

## 2016-12-05 ENCOUNTER — Encounter (INDEPENDENT_AMBULATORY_CARE_PROVIDER_SITE_OTHER): Payer: Self-pay | Admitting: Orthopedic Surgery

## 2016-12-05 VITALS — Ht 68.0 in | Wt 273.0 lb

## 2016-12-05 DIAGNOSIS — Z89431 Acquired absence of right foot: Secondary | ICD-10-CM

## 2016-12-05 DIAGNOSIS — Z89432 Acquired absence of left foot: Secondary | ICD-10-CM

## 2016-12-05 DIAGNOSIS — L97521 Non-pressure chronic ulcer of other part of left foot limited to breakdown of skin: Secondary | ICD-10-CM

## 2016-12-05 MED ORDER — OXYCODONE HCL 5 MG PO TABS: 5.0000 mg | ORAL_TABLET | Freq: Two times a day (BID) | ORAL | 0 refills | Status: DC | PRN

## 2016-12-05 NOTE — Progress Notes (Signed)
Office Visit Note   Patient: [name]           Date of Birth: September 18, 1981           MRN: [id number] Visit Date: 12/05/2016              Requested by: [name], FNP [address] [place], [place] [address] PCP: [name], FNP  Chief Complaint  Patient presents with  . Left Foot - Routine Post Op    10/08/16 transmet amputation  . Right Foot - Routine Post Op    10/08/16 transmet amputation    HPI: Patient is 2 months s/p bilateral transmet amputations.  The left transmet incision is open a more medially and there is 100% fibrinous tissue. There is spot amount of yellow drainage and the pt complains of pain. She is non weight bearing with a wheelchiar and post op shoes. The right side is well healed. The pt was applying santyl dressing changes but then decided to "dial it back" and stop using these about 1 week ago. She applies a nitro patch bilateraly daily. [name], RMA    Assessment & Plan: Visit Diagnoses:  1. Status post transmetatarsal amputation of right foot (HCC)   2. Status post transmetatarsal amputation of left foot (HCC)   3. Non-pressure chronic ulcer of other part of left foot limited to breakdown of skin (HCC)     Plan: We'll plan his wound dressing today to the left foot she'll continue nitroglycerin patch to the left foot she has persistent femoris ulcers the left foot continue Santyl dressing changes continue with her doxycycline prescription for oxycodone  Follow-Up Instructions: Return in about 2 weeks (around 12/19/2016).   Ortho Exam On examination the incision the right foot is completely healed she will discontinue nitroglycerin glycerin patch on the right transmetatarsal amputation. Examination the left foot she has a persistent fibrinous ulcer over the medial and lateral border of the transmetatarsal amputation and a heel ulcers will resolve the risks tissue these are tender to palpation there is no cellulitis no odor no  drainage. ROS: Negative for fever or chills. Imaging: No results found.  Labs: Lab Results  Component Value Date   HGBA1C 5.2 10/08/2016   HGBA1C 5.1 06/20/2016   HGBA1C 7.4 (H) 03/19/2015   ESRSEDRATE 38 (H) 09/27/2016   ESRSEDRATE 28 (H) 06/23/2016   ESRSEDRATE 28 (H) 06/20/2016   CRP 0.6 06/23/2016   CRP 0.8 06/20/2016   REPTSTATUS 09/26/2016 FINAL 09/21/2016   GRAMSTAIN  09/21/2014    RARE WBC NO SQUAMOUS EPITHELIAL CELLS SEEN FEW GRAM POSITIVE COCCI IN CLUSTERS Performed at [facility]    CULT NO GROWTH 5 DAYS 09/21/2016   LABORGA METHICILLIN RESISTANT STAPHYLOCOCCUS AUREUS 09/21/2014    Orders:  No orders of the defined types were placed in this encounter.  Meds ordered this encounter  Medications  . oxyCODONE (ROXICODONE) 5 MG immediate release tablet    Sig: Take 1 tablet (5 mg total) by mouth 2 (two) times daily as needed for breakthrough pain.    Dispense:  20 tablet    Refill:  0     Procedures: No procedures performed  Clinical Data: No additional findings.  Subjective: Review of Systems  Objective: Vital Signs: Ht 5\' 8"  (1.727 m)   Wt 273 lb (123.8 kg)   BMI 41.51 kg/m   Specialty Comments:  No specialty comments available.  PMFS History: Patient Active Problem List   Diagnosis Date Noted  .  Status post transmetatarsal amputation of left foot (HCC) 10/21/2016  . Status post transmetatarsal amputation of right foot (HCC) 10/08/2016  . 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 Diabetic osteomyelitis (HCC)   . 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 Diabetic foot infection (HCC) 06/20/2016  . ESRD (end stage renal disease) on dialysis (HCC) 06/20/2016  . Diabetic foot ulcer (HCC) 06/20/2016  . Pre-operative clearance 10/12/2015  . Normal coronary arteries 10/12/2015  . NSTEMI- type 2, Troponin 11.2 05/11/2015  . Respiratory failure requiring intubation (HCC)  05/10/2015  . History of arthroscopy of right shoulder-05/10/15 05/10/2015  . CKD (chronic kidney disease) stage 5, GFR less than 15 ml/min (HCC) 09/21/2014  . Unspecified asthma(493.90) 10/25/2013  . Acute combined systolic and diastolic heart failure (HCC) 09/21/2013  . Non-ischemic cardiomyopathy - by echo 8/16- EF 35-40% 09/16/2013  . Morbid obesity-  09/16/2013  . 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) 09/15/2013  . Normocytic anemia 09/15/2013  . Lower extremity edema 09/15/2013  . Hyperlipidemia   . Hypertension    Past Medical History:  Diagnosis Date  . Anemia   . Arthritis    "left hand" (09/15/2013)  . Asthma   . Chronic bronchitis (HCC)    "just about q yr" (09/15/2013)  . Chronic kidney disease    "low kidney function" (09/15/2013) ,  T/Th/Sa  . COPD (chronic obstructive pulmonary disease) (HCC)   . Coronary artery disease   . Hyperlipidemia   . Hypertension   . Migraine    "get them alot" (09/15/2013)  . Myocardial infarction 04/2015   NSTEMI  . Normal coronary arteries    by cardiac catheterization 09/20/13  . Peripheral vascular disease (HCC)   . Pneumonia    "couple times; have it now" (09/15/2013)  . PONV (postoperative nausea and vomiting)   . Restless legs   . Shortness of breath    "just recently; related to the pneumonia" (09/15/2013)  . Type 1 diabetes mellitus (HCC)    type 2    Family History  Problem Relation Age of Onset  . Diabetes Mother   . Stroke Mother   . Hypertension Father   . Hyperlipidemia Father   . Cancer - Lung Father   . Diabetes Maternal Grandmother   . Cancer Paternal Grandmother     Past Surgical History:  Procedure Laterality Date  . AMPUTATION Right 06/25/2016   Procedure: Amputation Right Great Toe at the Metatarsophalangeal Joint;  Surgeon: Nadara Mustard, MD;  Location[facility] OR;  Service: Orthopedics;  Laterality: Right;  . AMPUTATION Bilateral 10/08/2016   Procedure: Bilateral Transmetatarsal  Amputation;  Surgeon: Nadara Mustard, MD;  Location: [facility] OR;  Service: Orthopedics;  Laterality: Bilateral;  . AV FISTULA PLACEMENT Left 03/27/2015   Procedure: CREATION RADIAL CEPHALIC ARTERIOVENOUS FISTULA;  Surgeon: Chuck Hint, MD;  Location: [facility] OR;  Service: Vascular;  Laterality: Left;  . AV FISTULA PLACEMENT Left 11/23/2015   Procedure:  LEFT ARM BASILIC VEIN TRANSPOSITION  ;  Surgeon: Chuck Hint, MD;  Location: [facility] OR;  Service: Vascular;  Laterality: Left;  . CESAREAN SECTION  1999; 2006  . CORONARY ANGIOGRAM  09/20/2013   Procedure: CORONARY ANGIOGRAM;  Surgeon: Runell Gess, MD;  Location: [facility] CATH LAB;  Service: Cardiovascular;;  . FINGER SURGERY Left 1985   3rd and 4th digits reconstructed after cut off" (09/15/2013)  . PERIPHERAL VASCULAR CATHETERIZATION N/A 09/05/2016   Procedure: Abdominal  Aortogram;  Surgeon: Sherren Kerns, MD;  Location: [facility] INVASIVE CV LAB;  Service: Cardiovascular;  Laterality: N/A;  . PERIPHERAL VASCULAR CATHETERIZATION Bilateral 09/05/2016   Procedure: Lower Extremity Angiography;  Surgeon: Sherren Kerns, MD;  Location: [facility] INVASIVE CV LAB;  Service: Cardiovascular;  Laterality: Bilateral;  . PERIPHERAL VASCULAR CATHETERIZATION Right 09/05/2016   Procedure: Peripheral Vascular Balloon Angioplasty;  Surgeon: Sherren Kerns, MD;  Location: [facility] INVASIVE CV LAB;  Service: Cardiovascular;  Laterality: Right;  peroneal and AT  . SHOULDER ARTHROSCOPY WITH BICEPSTENOTOMY Right 05/10/2015   Procedure: RIGHT SHOULDER ARTHROSCOPY WITH BICEPS TENOTOMY, DEBRIDEMENT LABRAL TEAR;  Surgeon: Jones Broom, MD;  Location: [facility] OR;  Service: Orthopedics;  Laterality: Right;  Right shoulder arthroscopy biceps tenotomy, debridement labral tear  . TONSILLECTOMY  1997  . TUBAL LIGATION  2006   Social History   Occupational History  . Student    Social History Main Topics  . Smoking status: Never Smoker  . Smokeless tobacco: Never Used  . Alcohol use No   . Drug use: No  . Sexual activity: Not Currently    Partners: Male    Birth control/ protection: Other-see comments     Comment: S/P tubal ligation

## 2016-12-08 ENCOUNTER — Emergency Department (HOSPITAL_COMMUNITY)
Admission: EM | Admit: 2016-12-08 | Discharge: 2016-12-08 | Disposition: A | Discharge status: Home / Self Care | Payer: Medicaid Other | Attending: Emergency Medicine | Admitting: Emergency Medicine

## 2016-12-08 ENCOUNTER — Encounter (HOSPITAL_COMMUNITY): Payer: Self-pay | Admitting: *Deleted

## 2016-12-08 DIAGNOSIS — Z79899 Other long term (current) drug therapy: Secondary | ICD-10-CM | POA: Insufficient documentation

## 2016-12-08 DIAGNOSIS — I5041 Acute combined systolic (congestive) and diastolic (congestive) heart failure: Secondary | ICD-10-CM | POA: Insufficient documentation

## 2016-12-08 DIAGNOSIS — I132 Hypertensive heart and chronic kidney disease with heart failure and with stage 5 chronic kidney disease, or end stage renal disease: Secondary | ICD-10-CM | POA: Insufficient documentation

## 2016-12-08 DIAGNOSIS — G8918 Other acute postprocedural pain: Secondary | ICD-10-CM | POA: Diagnosis not present

## 2016-12-08 DIAGNOSIS — E1022 Type 1 diabetes mellitus with diabetic chronic kidney disease: Secondary | ICD-10-CM | POA: Diagnosis not present

## 2016-12-08 DIAGNOSIS — J449 Chronic obstructive pulmonary disease, unspecified: Secondary | ICD-10-CM | POA: Insufficient documentation

## 2016-12-08 DIAGNOSIS — N185 Chronic kidney disease, stage 5: Secondary | ICD-10-CM | POA: Insufficient documentation

## 2016-12-08 DIAGNOSIS — I2510 Atherosclerotic heart disease of native coronary artery without angina pectoris: Secondary | ICD-10-CM | POA: Diagnosis not present

## 2016-12-08 DIAGNOSIS — M79672 Pain in left foot: Secondary | ICD-10-CM | POA: Diagnosis present

## 2016-12-08 DIAGNOSIS — I252 Old myocardial infarction: Secondary | ICD-10-CM | POA: Insufficient documentation

## 2016-12-08 MED ORDER — OXYCODONE-ACETAMINOPHEN 5-325 MG PO TABS
2.0000 | ORAL_TABLET | Freq: Once | ORAL | Status: AC
  Administered 2016-12-08: 2 via ORAL
  Filled 2016-12-08: qty 2

## 2016-12-08 MED ORDER — OXYCODONE-ACETAMINOPHEN 5-325 MG PO TABS
1.0000 | ORAL_TABLET | Freq: Once | ORAL | Status: DC
  Filled 2016-12-08: qty 1

## 2016-12-08 MED ORDER — OXYCODONE-ACETAMINOPHEN 5-325 MG PO TABS: 1.0000 | ORAL_TABLET | Freq: Four times a day (QID) | ORAL | 0 refills | Status: DC | PRN

## 2016-12-08 NOTE — ED Provider Notes (Addendum)
[facility]-EMERGENCY DEPT Provider Note   CSN: [id number] Arrival date & time: 12/08/16  1608   By signing my name below, I, Clovis Pu, attest that this documentation has been prepared under the direction and in the presence of Mancel Bale, MD  Electronically Signed: Clovis Pu, ED Scribe. 12/08/16. 5:56 PM.   History   Chief Complaint Chief Complaint  Patient presents with  . Foot Pain   The history is provided by the patient. No language interpreter was used.   HPI Comments:  [name] is a 36 y.o. female, with a PSHx of bilateral transmetatarsal amputations, who presents to the Emergency Department complaining of persistent, moderate left foot pain which worsened 3 days ago. She also reports associated swelling, redness and minimal drainage. Pt has been applying santyl and changing her dressing daily. She has also been taking doxycycline since 11/21/2016. Pt states she was dialyzed today for 3 hours. She notes Dr. [name] gave her a pain medication prescription but notes the pharmacy will not fill her prescriptions until tomorrow due insurance issues. Pt denies fevers, vomiting, dizziness, weakness, pain to her lower left leg or any other associated symptoms. No other complaints noted.   Past Medical History:  Diagnosis Date  . Anemia   . Arthritis    "left hand" (09/15/2013)  . Asthma   . Chronic bronchitis (HCC)    "just about q yr" (09/15/2013)  . Chronic kidney disease    "low kidney function" (09/15/2013) ,  T/Th/Sa  . COPD (chronic obstructive pulmonary disease) (HCC)   . Coronary artery disease   . Hyperlipidemia   . Hypertension   . Migraine    "get them alot" (09/15/2013)  . Myocardial infarction 04/2015   NSTEMI  . Normal coronary arteries    by cardiac catheterization 09/20/13  . Peripheral vascular disease (HCC)   . Pneumonia    "couple times; have it now" (09/15/2013)  . PONV (postoperative nausea and vomiting)   . Restless legs   . Shortness of breath    "just recently; related to the pneumonia" (09/15/2013)  . Type 1 diabetes mellitus (HCC)    type 2    Patient Active Problem List   Diagnosis Date Noted  . Status post transmetatarsal amputation of left foot (HCC) 10/21/2016  . Status post transmetatarsal amputation of right foot (HCC) 10/08/2016  . 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 Diabetic osteomyelitis (HCC)   . 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 Diabetic foot infection (HCC) 06/20/2016  . ESRD (end stage renal disease) on dialysis (HCC) 06/20/2016  . Diabetic foot ulcer (HCC) 06/20/2016  . Pre-operative clearance 10/12/2015  . Normal coronary arteries 10/12/2015  . NSTEMI- type 2, Troponin 11.2 05/11/2015  . Respiratory failure requiring intubation (HCC) 05/10/2015  . History of arthroscopy of right shoulder-05/10/15 05/10/2015  . CKD (chronic kidney disease) stage 5, GFR less than 15 ml/min (HCC) 09/21/2014  . Unspecified asthma(493.90) 10/25/2013  . Acute combined systolic and diastolic heart failure (HCC) 09/21/2013  . Non-ischemic cardiomyopathy - by echo 8/16- EF 35-40% 09/16/2013  . Morbid obesity-  09/16/2013  . 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) 09/15/2013  . Normocytic anemia 09/15/2013  . Lower extremity edema 09/15/2013  . Hyperlipidemia   . Hypertension     Past Surgical History:  Procedure Laterality Date  . AMPUTATION Right 06/25/2016   Procedure: Amputation Right Great Toe at the Metatarsophalangeal Joint;  Surgeon: Randa Evens  [name], MD;  Location: [facility] OR;  Service: Orthopedics;  Laterality: Right;  . AMPUTATION Bilateral 10/08/2016   Procedure: Bilateral Transmetatarsal Amputation;  Surgeon: Nadara Mustard, MD;  Location: [facility] OR;  Service: Orthopedics;  Laterality: Bilateral;  . AV FISTULA PLACEMENT Left 03/27/2015   Procedure: CREATION RADIAL CEPHALIC ARTERIOVENOUS FISTULA;  Surgeon: Chuck Hint, MD;   Location: [facility] OR;  Service: Vascular;  Laterality: Left;  . AV FISTULA PLACEMENT Left 11/23/2015   Procedure:  LEFT ARM BASILIC VEIN TRANSPOSITION  ;  Surgeon: Chuck Hint, MD;  Location: [facility] OR;  Service: Vascular;  Laterality: Left;  . CESAREAN SECTION  1999; 2006  . CORONARY ANGIOGRAM  09/20/2013   Procedure: CORONARY ANGIOGRAM;  Surgeon: Runell Gess, MD;  Location: [facility] CATH LAB;  Service: Cardiovascular;;  . FINGER SURGERY Left 1985   3rd and 4th digits reconstructed after cut off" (09/15/2013)  . PERIPHERAL VASCULAR CATHETERIZATION N/A 09/05/2016   Procedure: Abdominal Aortogram;  Surgeon: Sherren Kerns, MD;  Location: [facility] INVASIVE CV LAB;  Service: Cardiovascular;  Laterality: N/A;  . PERIPHERAL VASCULAR CATHETERIZATION Bilateral 09/05/2016   Procedure: Lower Extremity Angiography;  Surgeon: Sherren Kerns, MD;  Location: [facility] INVASIVE CV LAB;  Service: Cardiovascular;  Laterality: Bilateral;  . PERIPHERAL VASCULAR CATHETERIZATION Right 09/05/2016   Procedure: Peripheral Vascular Balloon Angioplasty;  Surgeon: Sherren Kerns, MD;  Location: [facility] INVASIVE CV LAB;  Service: Cardiovascular;  Laterality: Right;  peroneal and AT  . SHOULDER ARTHROSCOPY WITH BICEPSTENOTOMY Right 05/10/2015   Procedure: RIGHT SHOULDER ARTHROSCOPY WITH BICEPS TENOTOMY, DEBRIDEMENT LABRAL TEAR;  Surgeon: Jones Broom, MD;  Location: [facility] OR;  Service: Orthopedics;  Laterality: Right;  Right shoulder arthroscopy biceps tenotomy, debridement labral tear  . TONSILLECTOMY  1997  . TUBAL LIGATION  2006    OB History    No data available       Home Medications    Prior to Admission medications   Medication Sig Start Date End Date Taking? Authorizing Provider  albuterol (PROVENTIL HFA;VENTOLIN HFA) 108 (90 BASE) MCG/ACT inhaler Inhale 2 puffs into the lungs every 6 (six) hours as needed for wheezing or shortness of breath.     Historical Provider, MD  albuterol (PROVENTIL) (2.5 MG/3ML) 0.083% nebulizer  solution Take 2.5 mg by nebulization every 6 (six) hours as needed for wheezing or shortness of breath.    Historical Provider, MD  atorvastatin (LIPITOR) 40 MG tablet Take 40 mg by mouth at bedtime.    Historical Provider, MD  budesonide (PULMICORT) 0.25 MG/2ML nebulizer solution Take 0.25 mg by nebulization 2 (two) times daily as needed (shortness of breath or wheezing).    Historical Provider, MD  carvedilol (COREG) 25 MG tablet Take 25 mg by mouth 2 (two) times daily.    Historical Provider, MD  clopidogrel (PLAVIX) 75 MG tablet Take 1 tablet (75 mg total) by mouth daily. 09/05/16   Sherren Kerns, MD  collagenase (SANTYL) ointment Apply 1 application topically daily. 11/21/16   Adonis Huguenin, NP  diphenhydrAMINE (BENADRYL) 25 MG tablet Take 25 mg by mouth every 6 (six) hours as needed for itching.    Historical Provider, MD  doxycycline (VIBRA-TABS) 100 MG tablet Take 1 tablet (100 mg total) by mouth 2 (two) times daily. 11/21/16   Adonis Huguenin, NP  DULoxetine (CYMBALTA) 30 MG capsule Take 1 capsule (30 mg total) by mouth daily. Qam 07/21/16   Nadara Mustard, MD  hydrOXYzine (ATARAX/VISTARIL) 25 MG tablet Take 1 tablet (25  mg total) by mouth every 6 (six) hours. 03/30/16   Rolland Porter, MD  insulin aspart (NOVOLOG FLEXPEN) 100 UNIT/ML FlexPen Inject 2-15 Units into the skin 3 (three) times daily with meals. Per sliding scale - based on carb count and CBG     Historical Provider, MD  insulin aspart (NOVOLOG FLEXPEN) 100 UNIT/ML FlexPen ADMINISTER 14 UNITS UNDER THE SKIN 15 MINUTES BEFORE MEALS 11/03/16   Historical Provider, MD  Insulin Glargine (LANTUS SOLOSTAR) 100 UNIT/ML Solostar Pen Inject 10 Units into the skin at bedtime. Patient taking differently: Inject 14 Units into the skin at bedtime.  06/26/16   Richarda Overlie, MD  isosorbide dinitrate (ISORDIL) 20 MG tablet Take 20 mg by mouth 3 (three) times daily.    Historical Provider, MD  methocarbamol (ROBAXIN) 500 MG tablet Take 1 tablet (500 mg total)  by mouth 4 (four) times daily. 11/06/16   Adonis Huguenin, NP  nystatin (MYCOSTATIN/NYSTOP) powder Apply topically 4 (four) times daily. To each groin area 09/05/16   Sherren Kerns, MD  oxyCODONE (ROXICODONE) 5 MG immediate release tablet Take 1 tablet (5 mg total) by mouth 2 (two) times daily as needed for breakthrough pain. 12/05/16   Nadara Mustard, MD  pregabalin (LYRICA) 50 MG capsule Take 1 capsule (50 mg total) by mouth 3 (three) times daily. 07/21/16   Adonis Huguenin, NP  sevelamer carbonate (RENVELA) 800 MG tablet Take 3,200 mg by mouth 3 (three) times daily with meals.    Historical Provider, MD    Family History Family History  Problem Relation Age of Onset  . Diabetes Mother   . Stroke Mother   . Hypertension Father   . Hyperlipidemia Father   . Cancer - Lung Father   . Diabetes Maternal Grandmother   . Cancer Paternal Grandmother     Social History Social History  Substance Use Topics  . Smoking status: Never Smoker  . Smokeless tobacco: Never Used  . Alcohol use No     Allergies   Aspirin; Sulfur; and Tramadol   Review of Systems Review of Systems  Constitutional: Negative for fever.  Gastrointestinal: Negative for vomiting.  Musculoskeletal: Positive for myalgias.  Skin: Positive for wound.  Neurological: Negative for dizziness and weakness.  All other systems reviewed and are negative.   Physical Exam Updated Vital Signs BP 160/93   Pulse 90   Temp 97.7 F (36.5 C)   Resp 18   Ht 5\' 8"  (1.727 m)   Wt 243 lb (110.2 kg)   LMP 11/30/2016   SpO2 97%   BMI 36.95 kg/m   Physical Exam  Constitutional: She is oriented to person, place, and time. She appears well-developed and well-nourished.  HENT:  Head: Normocephalic and atraumatic.  Eyes: Conjunctivae and EOM are normal. Pupils are equal, round, and reactive to light.  Neck: Normal range of motion and phonation normal. Neck supple.  Cardiovascular: Normal rate and regular rhythm.   Pulmonary/Chest:  Effort normal and breath sounds normal. She exhibits no tenderness.  Abdominal: Soft. She exhibits no distension. There is no tenderness. There is no guarding.  Musculoskeletal: Normal range of motion. She exhibits edema and tenderness.  2 open wounds medial and lateral at the wound closure site which are open and drainaing a small amout of yellow discharge. Mild associated redness and tenderness around wound edges. No proximal foot or left lower leg tendereness. 2+ edema of left lower leg.   Neurological: She is alert and oriented to person, place,  and time. She exhibits normal muscle tone.  Skin: Skin is warm and dry.  Psychiatric: She has a normal mood and affect. Her behavior is normal. Judgment and thought content normal.  Nursing note and vitals reviewed.    ED Treatments / Results  DIAGNOSTIC STUDIES:  Oxygen Saturation is 97% on RA, normal by my interpretation.    COORDINATION OF CARE:  5:46 PM Discussed treatment plan with pt at bedside and pt agreed to plan.  Labs (all labs ordered are listed, but only abnormal results are displayed) Labs Reviewed - No data to display  EKG  EKG Interpretation None               Radiology No results found.  Procedures Procedures (including critical care time)  Medications Ordered in ED Medications - No data to display   Initial Impression / Assessment and Plan / ED Course  I have reviewed the triage vital signs and the nursing notes.  Pertinent labs & imaging results that were available during my care of the patient were reviewed by me and considered in my medical decision making (see chart for details).     Medications  oxyCODONE-acetaminophen (PERCOCET/ROXICET) 5-325 MG per tablet 2 tablet (not administered)    Patient Vitals for the past 24 hrs:  BP Temp Pulse Resp SpO2 Height Weight  12/08/16 1618 - - - - - 5\' 8"  (1.727 m) 243 lb (110.2 kg)  12/08/16 1617 160/93 97.7 F (36.5 C) 90 18 97 % - -   I  discussed the patient's difficulty getting her oxycodone prescription, from the pharmacy, [facility], on [address].  Her current prescription, which she is trying to get filled, was written for twice a day, as well as her preceding prescription.  There have not been enough elapsed days, for her to fill another twice daily prescription.  The pharmacist states that if I write a prescription for 4 times daily, then her insurance provider will pay for the prescription.   6:01 PM Reevaluation with update and discussion. After initial assessment and treatment, an updated evaluation reveals no change in clinical status.  Findings discussed with patient and all questions answered. Ashton Belote L    Final Clinical Impressions(s) / ED Diagnoses   Final diagnoses:  Post-operative pain    Postoperative foot pain, with likely mild wound infection, and increasing pain, requiring advancement of her narcotic dosing.  Doubt a sending infection, or sepsis.  Nursing Notes Reviewed/ Care Coordinated Applicable Imaging Reviewed Interpretation of Laboratory Data incorporated into ED treatment  The patient appears reasonably screened and/or stabilized for discharge and I doubt any other medical condition or other [facility] requiring further screening, evaluation, or treatment in the ED at this time prior to discharge.  Plan: Home Medications-increase dosage, to 4 times daily oxycodone; Home Treatments-rest, foot elevation, heat; return here if the recommended treatment, does not improve the symptoms; Recommended follow up-orthopedics, as scheduled, sooner if needed.   New Prescriptions New Prescriptions   No medications on file  I personally performed the services described in this documentation, which was scribed in my presence. The recorded information has been reviewed and is accurate.      Mancel Bale, MD 12/08/16 [id number]    Mancel Bale, MD 12/08/16 [phone]

## 2016-12-08 NOTE — ED Notes (Signed)
Pt c/o right foot pain. Amputation of 9 toes on 1/27. Open wound with yellow drainage coming from open incision on right foot. C/o 10/10 pain at site.

## 2016-12-08 NOTE — ED Notes (Signed)
New bandage applied to foot

## 2016-12-08 NOTE — ED Triage Notes (Signed)
The pt is dialysis pt that was dialyzed  Today  She has a fistula in her lt arm.  She was given tylenol for pain there before she came here  She reports that the tylenol is not helping her pain

## 2016-12-08 NOTE — Discharge Instructions (Signed)
Continue to change the bandage on your foot wound, every day.  To help decrease the pain, try using heat on the sore area and elevating the foot above your heart.  Follow-up with your orthopedic doctor as scheduled, sooner if needed.  To avoid using the pain medicine, oxycodone, and instead use Tylenol if you are able, to help control the pain.

## 2016-12-08 NOTE — ED Triage Notes (Signed)
The pt is here for pain medicine  She had all her toes amputated in January  And she saw dr duda on Friday  Who gave her a rx for pain  She has not gotten it filled and cannot get it until tomorrow  lmp march 1st

## 2016-12-10 ENCOUNTER — Telehealth (INDEPENDENT_AMBULATORY_CARE_PROVIDER_SITE_OTHER): Payer: Self-pay | Admitting: *Deleted

## 2016-12-10 ENCOUNTER — Emergency Department (HOSPITAL_COMMUNITY)
Admission: EM | Admit: 2016-12-10 | Discharge: 2016-12-10 | Disposition: A | Discharge status: Home / Self Care | Payer: Medicaid Other | Attending: Emergency Medicine | Admitting: Emergency Medicine

## 2016-12-10 ENCOUNTER — Encounter (HOSPITAL_COMMUNITY): Payer: Self-pay | Admitting: *Deleted

## 2016-12-10 DIAGNOSIS — I252 Old myocardial infarction: Secondary | ICD-10-CM | POA: Diagnosis not present

## 2016-12-10 DIAGNOSIS — J449 Chronic obstructive pulmonary disease, unspecified: Secondary | ICD-10-CM | POA: Insufficient documentation

## 2016-12-10 DIAGNOSIS — Z452 Encounter for adjustment and management of vascular access device: Secondary | ICD-10-CM

## 2016-12-10 DIAGNOSIS — I2510 Atherosclerotic heart disease of native coronary artery without angina pectoris: Secondary | ICD-10-CM | POA: Diagnosis not present

## 2016-12-10 DIAGNOSIS — Z466 Encounter for fitting and adjustment of urinary device: Secondary | ICD-10-CM | POA: Insufficient documentation

## 2016-12-10 DIAGNOSIS — Z79899 Other long term (current) drug therapy: Secondary | ICD-10-CM | POA: Diagnosis not present

## 2016-12-10 DIAGNOSIS — I120 Hypertensive chronic kidney disease with stage 5 chronic kidney disease or end stage renal disease: Secondary | ICD-10-CM | POA: Diagnosis not present

## 2016-12-10 DIAGNOSIS — N185 Chronic kidney disease, stage 5: Secondary | ICD-10-CM | POA: Insufficient documentation

## 2016-12-10 DIAGNOSIS — E1022 Type 1 diabetes mellitus with diabetic chronic kidney disease: Secondary | ICD-10-CM | POA: Diagnosis not present

## 2016-12-10 NOTE — ED Notes (Signed)
Pt is refusing gown

## 2016-12-10 NOTE — Telephone Encounter (Signed)
This was faxed last week to all three provided numbers and will be refaxed again tomorrow. Please look at previous messages.

## 2016-12-10 NOTE — ED Notes (Signed)
Pt her for bleeding acces on L arm. Pt states her graft was accessed for dialysis on Monday and has been bleeding since. Pt has gauze placed over access, slight bleeding noted.

## 2016-12-10 NOTE — ED Triage Notes (Signed)
Pt reports having dialysis on Monday, access is fistula in left arm. Reports having steady bleeding from site since Monday. New dressing applied at triage, minimal bleeding noted at this time.

## 2016-12-10 NOTE — Discharge Instructions (Signed)
The bleeding from your access may be due to the accidental dose of heparin that you received at dialysis on Monday.  It is a good sign that the bleeding has slowed down and basically stopped at this point.  Continue applying pressure to the site and go to your planned dialysis session tomorrow.    Return to the ED if your bleeding worsens, you are unable to feel a palpable thrill to your acces site or have numbness, tingling to your left hand.

## 2016-12-10 NOTE — ED Provider Notes (Signed)
[facility]-EMERGENCY DEPT Provider Note   CSN: [id number] Arrival date & time: 12/10/16  1657     History   Chief Complaint Chief Complaint  Patient presents with  . Vascular Access Problem    HPI [name] is a 36 y.o. female who presents to ED with concerns with LUE Fistula. Patient states she gets hemodialysis on Tuesday/Thursday and Saturday typically however she went Monday (3 days ago) because she was not able to go on Tuesday this week. Patient states that the dialysis tech accidentally gave her heparin during her dialysis session, patient states that she typically doesn't get heparin because she is on Plavix. Patient states that her usual dialysis tech knows this and doesn't give her heparin. Patient states that it was an accident and she was not paying attention when the dialysis tech give her heparin. Patient states that that night she had "bleeding down to my hand" and had to change her dressing multiple times. Patient denies associated pain, numbness, tingling. Patient states she feels a strong thrill at her fistula site. Patient states that over the last 2 days since her dialysis session the bleeding has slowed down, yesterday she had to change her dressing twice.  HPI  Past Medical History:  Diagnosis Date  . Anemia   . Arthritis    "left hand" (09/15/2013)  . Asthma   . Chronic bronchitis (HCC)    "just about q yr" (09/15/2013)  . Chronic kidney disease    "low kidney function" (09/15/2013) ,  T/Th/Sa  . COPD (chronic obstructive pulmonary disease) (HCC)   . Coronary artery disease   . Hyperlipidemia   . Hypertension   . Migraine    "get them alot" (09/15/2013)  . Myocardial infarction 04/2015   NSTEMI  . Normal coronary arteries    by cardiac catheterization 09/20/13  . Peripheral vascular disease (HCC)   . Pneumonia    "couple times; have it now" (09/15/2013)  . PONV (postoperative nausea and vomiting)   . Restless legs   . Shortness of breath    "just  recently; related to the pneumonia" (09/15/2013)  . Type 1 diabetes mellitus (HCC)    type 2    Patient Active Problem List   Diagnosis Date Noted  . Status post transmetatarsal amputation of left foot (HCC) 10/21/2016  . Status post transmetatarsal amputation of right foot (HCC) 10/08/2016  . 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 Diabetic osteomyelitis (HCC)   . 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 Diabetic foot infection (HCC) 06/20/2016  . ESRD (end stage renal disease) on dialysis (HCC) 06/20/2016  . Diabetic foot ulcer (HCC) 06/20/2016  . Pre-operative clearance 10/12/2015  . Normal coronary arteries 10/12/2015  . NSTEMI- type 2, Troponin 11.2 05/11/2015  . Respiratory failure requiring intubation (HCC) 05/10/2015  . History of arthroscopy of right shoulder-05/10/15 05/10/2015  . CKD (chronic kidney disease) stage 5, GFR less than 15 ml/min (HCC) 09/21/2014  . Unspecified asthma(493.90) 10/25/2013  . Acute combined systolic and diastolic heart failure (HCC) 09/21/2013  . Non-ischemic cardiomyopathy - by echo 8/16- EF 35-40% 09/16/2013  . Morbid obesity-  09/16/2013  . 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) 09/15/2013  . Normocytic anemia 09/15/2013  . Lower extremity edema 09/15/2013  . Hyperlipidemia   . Hypertension     Past Surgical History:  Procedure Laterality Date  . AMPUTATION Right 06/25/2016   Procedure: Amputation Right Great Toe at the  Metatarsophalangeal Joint;  Surgeon: Nadara Mustard, MD;  Location: [facility] OR;  Service: Orthopedics;  Laterality: Right;  . AMPUTATION Bilateral 10/08/2016   Procedure: Bilateral Transmetatarsal Amputation;  Surgeon: Nadara Mustard, MD;  Location: [facility] OR;  Service: Orthopedics;  Laterality: Bilateral;  . AV FISTULA PLACEMENT Left 03/27/2015   Procedure: CREATION RADIAL CEPHALIC ARTERIOVENOUS FISTULA;  Surgeon: Chuck Hint, MD;  Location: [facility]  OR;  Service: Vascular;  Laterality: Left;  . AV FISTULA PLACEMENT Left 11/23/2015   Procedure:  LEFT ARM BASILIC VEIN TRANSPOSITION  ;  Surgeon: Chuck Hint, MD;  Location: [facility] OR;  Service: Vascular;  Laterality: Left;  . CESAREAN SECTION  1999; 2006  . CORONARY ANGIOGRAM  09/20/2013   Procedure: CORONARY ANGIOGRAM;  Surgeon: Runell Gess, MD;  Location: [facility] CATH LAB;  Service: Cardiovascular;;  . FINGER SURGERY Left 1985   3rd and 4th digits reconstructed after cut off" (09/15/2013)  . PERIPHERAL VASCULAR CATHETERIZATION N/A 09/05/2016   Procedure: Abdominal Aortogram;  Surgeon: Sherren Kerns, MD;  Location: [facility] INVASIVE CV LAB;  Service: Cardiovascular;  Laterality: N/A;  . PERIPHERAL VASCULAR CATHETERIZATION Bilateral 09/05/2016   Procedure: Lower Extremity Angiography;  Surgeon: Sherren Kerns, MD;  Location: [facility] INVASIVE CV LAB;  Service: Cardiovascular;  Laterality: Bilateral;  . PERIPHERAL VASCULAR CATHETERIZATION Right 09/05/2016   Procedure: Peripheral Vascular Balloon Angioplasty;  Surgeon: Sherren Kerns, MD;  Location: [facility] INVASIVE CV LAB;  Service: Cardiovascular;  Laterality: Right;  peroneal and AT  . SHOULDER ARTHROSCOPY WITH BICEPSTENOTOMY Right 05/10/2015   Procedure: RIGHT SHOULDER ARTHROSCOPY WITH BICEPS TENOTOMY, DEBRIDEMENT LABRAL TEAR;  Surgeon: Jones Broom, MD;  Location: [facility] OR;  Service: Orthopedics;  Laterality: Right;  Right shoulder arthroscopy biceps tenotomy, debridement labral tear  . TONSILLECTOMY  1997  . TUBAL LIGATION  2006    OB History    No data available       Home Medications    Prior to Admission medications   Medication Sig Start Date End Date Taking? Authorizing Provider  albuterol (PROVENTIL HFA;VENTOLIN HFA) 108 (90 BASE) MCG/ACT inhaler Inhale 2 puffs into the lungs every 6 (six) hours as needed for wheezing or shortness of breath.     Historical Provider, MD  albuterol (PROVENTIL) (2.5 MG/3ML) 0.083% nebulizer solution  Take 2.5 mg by nebulization every 6 (six) hours as needed for wheezing or shortness of breath.    Historical Provider, MD  atorvastatin (LIPITOR) 40 MG tablet Take 40 mg by mouth at bedtime.    Historical Provider, MD  budesonide (PULMICORT) 0.25 MG/2ML nebulizer solution Take 0.25 mg by nebulization 2 (two) times daily as needed (shortness of breath or wheezing).    Historical Provider, MD  carvedilol (COREG) 25 MG tablet Take 25 mg by mouth 2 (two) times daily.    Historical Provider, MD  clopidogrel (PLAVIX) 75 MG tablet Take 1 tablet (75 mg total) by mouth daily. 09/05/16   Sherren Kerns, MD  collagenase (SANTYL) ointment Apply 1 application topically daily. 11/21/16   Adonis Huguenin, NP  diphenhydrAMINE (BENADRYL) 25 MG tablet Take 25 mg by mouth every 6 (six) hours as needed for itching.    Historical Provider, MD  doxycycline (VIBRA-TABS) 100 MG tablet Take 1 tablet (100 mg total) by mouth 2 (two) times daily. 11/21/16   Adonis Huguenin, NP  DULoxetine (CYMBALTA) 30 MG capsule Take 1 capsule (30 mg total) by mouth daily. Qam 07/21/16   Nadara Mustard, MD  hydrOXYzine (ATARAX/VISTARIL) 25  MG tablet Take 1 tablet (25 mg total) by mouth every 6 (six) hours. 03/30/16   Rolland Porter, MD  insulin aspart (NOVOLOG FLEXPEN) 100 UNIT/ML FlexPen Inject 2-15 Units into the skin 3 (three) times daily with meals. Per sliding scale - based on carb count and CBG     Historical Provider, MD  insulin aspart (NOVOLOG FLEXPEN) 100 UNIT/ML FlexPen ADMINISTER 14 UNITS UNDER THE SKIN 15 MINUTES BEFORE MEALS 11/03/16   Historical Provider, MD  Insulin Glargine (LANTUS SOLOSTAR) 100 UNIT/ML Solostar Pen Inject 10 Units into the skin at bedtime. Patient taking differently: Inject 14 Units into the skin at bedtime.  06/26/16   Richarda Overlie, MD  isosorbide dinitrate (ISORDIL) 20 MG tablet Take 20 mg by mouth 3 (three) times daily.    Historical Provider, MD  methocarbamol (ROBAXIN) 500 MG tablet Take 1 tablet (500 mg total) by mouth  4 (four) times daily. 11/06/16   Adonis Huguenin, NP  nystatin (MYCOSTATIN/NYSTOP) powder Apply topically 4 (four) times daily. To each groin area 09/05/16   Sherren Kerns, MD  oxyCODONE (ROXICODONE) 5 MG immediate release tablet Take 1 tablet (5 mg total) by mouth 2 (two) times daily as needed for breakthrough pain. 12/05/16   Nadara Mustard, MD  oxyCODONE-acetaminophen (PERCOCET/ROXICET) 5-325 MG tablet Take 1 tablet by mouth every 6 (six) hours as needed for moderate pain or severe pain. 12/08/16   Mancel Bale, MD  pregabalin (LYRICA) 50 MG capsule Take 1 capsule (50 mg total) by mouth 3 (three) times daily. 07/21/16   Adonis Huguenin, NP  sevelamer carbonate (RENVELA) 800 MG tablet Take 3,200 mg by mouth 3 (three) times daily with meals.    Historical Provider, MD    Family History Family History  Problem Relation Age of Onset  . Diabetes Mother   . Stroke Mother   . Hypertension Father   . Hyperlipidemia Father   . Cancer - Lung Father   . Diabetes Maternal Grandmother   . Cancer Paternal Grandmother     Social History Social History  Substance Use Topics  . Smoking status: Never Smoker  . Smokeless tobacco: Never Used  . Alcohol use No     Allergies   Aspirin; Sulfur; and Tramadol   Review of Systems Review of Systems  Constitutional: Negative for chills and fever.  Respiratory: Negative for shortness of breath.   Cardiovascular: Negative for chest pain.  Gastrointestinal: Negative for constipation, diarrhea, nausea and vomiting.  Musculoskeletal: Negative for myalgias.  Neurological: Negative for dizziness.  Hematological: Bruises/bleeds easily.     Physical Exam Updated Vital Signs BP (!) 156/101   Pulse 91   Temp 98.7 F (37.1 C) (Oral)   Resp 18   LMP 11/30/2016   SpO2 96%   Physical Exam  Constitutional: She is oriented to person, place, and time. She appears well-developed and well-nourished.  HENT:  Head: Normocephalic and atraumatic.  Nose: Nose  normal.  Mouth/Throat: Oropharynx is clear and moist. No oropharyngeal exudate.  Eyes: Conjunctivae and EOM are normal. Pupils are equal, round, and reactive to light.  Neck: Normal range of motion. Neck supple. No JVD present.  Cardiovascular: Normal rate, regular rhythm, normal heart sounds and intact distal pulses.   No murmur heard. LUE AV fistula with good palpable thrill, easily ascultated.  No active bleeding without bandage.   No bony tenderness over left elbow, wrist or hand joints.  Full ROM of LUE without pain.   Pulmonary/Chest: Effort normal and breath  sounds normal. No respiratory distress. She has no wheezes. She has no rales. She exhibits no tenderness.  Abdominal: Soft. Bowel sounds are normal. She exhibits no distension and no mass. There is no tenderness. There is no rebound and no guarding.  Musculoskeletal: Normal range of motion. She exhibits no deformity.  Lymphadenopathy:    She has no cervical adenopathy.  Neurological: She is alert and oriented to person, place, and time. No sensory deficit.  Sensation to light touch in the medial/radial/ulnar distribution to LUE intact Good grip strength bilaterally 5/5 strength with left elbow flexion and extension  Skin: Skin is warm and dry. Capillary refill takes less than 2 seconds.  No surrounding erythema, edema, tenderness, fluctuance at LUE AV fistula site. <2 capillary refill at L digits LUE skin warm  Psychiatric: She has a normal mood and affect. Her behavior is normal. Judgment and thought content normal.  Nursing note and vitals reviewed.    ED Treatments / Results  Labs (all labs ordered are listed, but only abnormal results are displayed) Labs Reviewed - No data to display  EKG  EKG Interpretation None       Radiology No results found.  Procedures Procedures (including critical care time)  Medications Ordered in ED Medications - No data to display   Initial Impression / Assessment and Plan /  ED Course  I have reviewed the triage vital signs and the nursing notes.  Pertinent labs & imaging results that were available during my care of the patient were reviewed by me and considered in my medical decision making (see chart for details).    No signs or symptoms of left upper extremity fistula compromise. It appears that patient received an accidental dose of heparin 2 days ago during her dialysis session which preceded the onset of bleeding. Per patient she usually does not get heparin during dialysis, states that she is on Plavix and has not been getting heparin. Per patient there is no pain, numbness, tingling or decreased in range of motion in the left upper extremity. Patient reports the bleeding has actually stopped in the emergency department, over the last day the bleeding has also been slowing down. On exam there is strong palpable thrill, easily auscultated, good capillary refill, left upper extremity is warm. No neurological deficits in left upper extremity. Full range of motion at joints in the left upper extremity. I do not think that further emergent imaging or lab work is indicated at this time as it appears that the bleeding has been stopped. Patient has planned hemodialysis tomorrow morning, advised to follow-up as planned and to be reevaluated at hemodialysis to ensure her vascular access is functioning well. Patient verbalized understanding, is agreeable to discharge, plans to go to hemodialysis tomorrow. Strict ED return precautions, patient is aware of red flag symptoms that would warrant return to the emergency department.  Final Clinical Impressions(s) / ED Diagnoses   Final diagnoses:  Encounter for care related to vascular access port    New Prescriptions New Prescriptions   No medications on file     Jerrell Mylar 12/10/16 1911    Bethann Berkshire, MD 12/10/16 2207

## 2016-12-10 NOTE — Telephone Encounter (Signed)
[place] called about SCAT forms that were sent to our office to be filled out for pt. They need to be faxed back to [facility] FAX: [phone], or [phone], or [phone]. Jacinto Halim stated this original message was sent over a month ago.

## 2016-12-11 NOTE — Telephone Encounter (Signed)
Faxed again to all numbers below

## 2016-12-19 ENCOUNTER — Telehealth (INDEPENDENT_AMBULATORY_CARE_PROVIDER_SITE_OTHER): Payer: Self-pay | Admitting: Orthopedic Surgery

## 2016-12-19 ENCOUNTER — Encounter (INDEPENDENT_AMBULATORY_CARE_PROVIDER_SITE_OTHER): Payer: Self-pay | Admitting: Orthopedic Surgery

## 2016-12-19 ENCOUNTER — Ambulatory Visit (INDEPENDENT_AMBULATORY_CARE_PROVIDER_SITE_OTHER): Payer: Medicaid Other | Admitting: Orthopedic Surgery

## 2016-12-19 DIAGNOSIS — Z89432 Acquired absence of left foot: Secondary | ICD-10-CM

## 2016-12-19 MED ORDER — OXYCODONE-ACETAMINOPHEN 5-325 MG PO TABS: 1.0000 | ORAL_TABLET | Freq: Two times a day (BID) | ORAL | 0 refills | Status: DC | PRN

## 2016-12-19 NOTE — Progress Notes (Signed)
Office Visit Note   Patient: [name]           Date of Birth: 1981-03-31           MRN: [id number] Visit Date: 12/19/2016              Requested by: [name], FNP [address] [place], [place] [address] PCP: [name], FNP  Chief Complaint  Patient presents with  . Left Foot - Follow-up    Bilateral Transmetatarsal Amputation 10/08/16  . Right Foot - Follow-up    Bilateral Transmetatarsal Amputation 10/08/16    HPI: The patient is a 36 year old woman who presents today for evaluation of her left transmetatarsal amputation. The surgery was initially performed January 10 of this year. The amputation has not healed medially and laterally. Continued intense pain.  Has been a request today including scapular paperwork that has been faxed 6 or 7 times. Is provided today to her in a hard copy. Also requesting a smaller shoe for her left transmetatarsal amputation. This is provided. Does state shoe is unable to wear the nitroglycerin patches as these irritated her skin. Complains of continued shooting pain and difficulty getting her pain medicine occasion prescribed as she's had difficulty getting approved for Medicaid.  Assessment & Plan: Visit Diagnoses:  1. Status post transmetatarsal amputation of left foot (HCC)     Plan: Advised she may continue using Santyl over the lateral ulcer however the medial ulcer we will transition to Silvadene. continue protected weightbearing.  Follow-Up Instructions: Return in about 4 weeks (around 01/16/2017).   Ortho Exam  Patient is alert, oriented, no adenopathy, well-dressed, normal affect, normal respiratory effort. There is ulceration laterally this appears to be healing and is now just 1 cm in diameter filled in with fibrinous exudative tissue. There is no drainage today. Medially over the first metatarsal there is open ulceration this is 2 cm in diameter this has a depth of 5 mm there is necrotic tissue in the wound  bed. There is no exposed tendon or bone. This does not probe to bone. There is no eschar. Minimal serous drainage. No odor no cellulitis.   Imaging: No results found.  Labs: Lab Results  Component Value Date   HGBA1C 5.2 10/08/2016   HGBA1C 5.1 06/20/2016   HGBA1C 7.4 (H) 03/19/2015   ESRSEDRATE 38 (H) 09/27/2016   ESRSEDRATE 28 (H) 06/23/2016   ESRSEDRATE 28 (H) 06/20/2016   CRP 0.6 06/23/2016   CRP 0.8 06/20/2016   REPTSTATUS 09/26/2016 FINAL 09/21/2016   GRAMSTAIN  09/21/2014    RARE WBC NO SQUAMOUS EPITHELIAL CELLS SEEN FEW GRAM POSITIVE COCCI IN CLUSTERS Performed at [facility]    CULT NO GROWTH 5 DAYS 09/21/2016   LABORGA METHICILLIN RESISTANT STAPHYLOCOCCUS AUREUS 09/21/2014    Orders:  No orders of the defined types were placed in this encounter.  Meds ordered this encounter  Medications  . oxyCODONE-acetaminophen (PERCOCET/ROXICET) 5-325 MG tablet    Sig: Take 1 tablet by mouth 2 (two) times daily as needed for severe pain.    Dispense:  60 tablet    Refill:  0     Procedures: No procedures performed  Clinical Data: No additional findings.  ROS: Review of Systems  Constitutional: Negative for chills and fever.  Skin: Positive for wound. Negative for color change.    Objective: Vital Signs: LMP 11/30/2016   Specialty Comments:  No specialty comments available.  PMFS History: Patient Active Problem List   Diagnosis  Date Noted  . Status post transmetatarsal amputation of left foot (HCC) 10/21/2016  . Status post transmetatarsal amputation of right foot (HCC) 10/08/2016  . 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 Diabetic osteomyelitis (HCC)   . 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 Diabetic foot infection (HCC) 06/20/2016  . ESRD (end stage renal disease) on dialysis (HCC) 06/20/2016  . Diabetic foot ulcer (HCC) 06/20/2016  . Pre-operative clearance 10/12/2015  .  Normal coronary arteries 10/12/2015  . NSTEMI- type 2, Troponin 11.2 05/11/2015  . Respiratory failure requiring intubation (HCC) 05/10/2015  . History of arthroscopy of right shoulder-05/10/15 05/10/2015  . CKD (chronic kidney disease) stage 5, GFR less than 15 ml/min (HCC) 09/21/2014  . Unspecified asthma(493.90) 10/25/2013  . Acute combined systolic and diastolic heart failure (HCC) 09/21/2013  . Non-ischemic cardiomyopathy - by echo 8/16- EF 35-40% 09/16/2013  . Morbid obesity-  09/16/2013  . 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) 09/15/2013  . Normocytic anemia 09/15/2013  . Lower extremity edema 09/15/2013  . Hyperlipidemia   . Hypertension    Past Medical History:  Diagnosis Date  . Anemia   . Arthritis    "left hand" (09/15/2013)  . Asthma   . Chronic bronchitis (HCC)    "just about q yr" (09/15/2013)  . Chronic kidney disease    "low kidney function" (09/15/2013) ,  T/Th/Sa  . COPD (chronic obstructive pulmonary disease) (HCC)   . Coronary artery disease   . Hyperlipidemia   . Hypertension   . Migraine    "get them alot" (09/15/2013)  . Myocardial infarction 04/2015   NSTEMI  . Normal coronary arteries    by cardiac catheterization 09/20/13  . Peripheral vascular disease (HCC)   . Pneumonia    "couple times; have it now" (09/15/2013)  . PONV (postoperative nausea and vomiting)   . Restless legs   . Shortness of breath    "just recently; related to the pneumonia" (09/15/2013)  . Type 1 diabetes mellitus (HCC)    type 2    Family History  Problem Relation Age of Onset  . Diabetes Mother   . Stroke Mother   . Hypertension Father   . Hyperlipidemia Father   . Cancer - Lung Father   . Diabetes Maternal Grandmother   . Cancer Paternal Grandmother     Past Surgical History:  Procedure Laterality Date  . AMPUTATION Right 06/25/2016   Procedure: Amputation Right Great Toe at the Metatarsophalangeal Joint;  Surgeon: Nadara Mustard, MD;   Location: [facility] OR;  Service: Orthopedics;  Laterality: Right;  . AMPUTATION Bilateral 10/08/2016   Procedure: Bilateral Transmetatarsal Amputation;  Surgeon: Nadara Mustard, MD;  Location: [facility] OR;  Service: Orthopedics;  Laterality: Bilateral;  . AV FISTULA PLACEMENT Left 03/27/2015   Procedure: CREATION RADIAL CEPHALIC ARTERIOVENOUS FISTULA;  Surgeon: Chuck Hint, MD;  Location: [facility] OR;  Service: Vascular;  Laterality: Left;  . AV FISTULA PLACEMENT Left 11/23/2015   Procedure:  LEFT ARM BASILIC VEIN TRANSPOSITION  ;  Surgeon: Chuck Hint, MD;  Location: [facility] OR;  Service: Vascular;  Laterality: Left;  . CESAREAN SECTION  1999; 2006  . CORONARY ANGIOGRAM  09/20/2013   Procedure: CORONARY ANGIOGRAM;  Surgeon: Runell Gess, MD;  Location: [facility] CATH LAB;  Service: Cardiovascular;;  . FINGER SURGERY Left 1985   3rd and 4th digits reconstructed after cut off" (09/15/2013)  . PERIPHERAL VASCULAR CATHETERIZATION N/A 09/05/2016  Procedure: Abdominal Aortogram;  Surgeon: Sherren Kerns, MD;  Location: [facility] INVASIVE CV LAB;  Service: Cardiovascular;  Laterality: N/A;  . PERIPHERAL VASCULAR CATHETERIZATION Bilateral 09/05/2016   Procedure: Lower Extremity Angiography;  Surgeon: Sherren Kerns, MD;  Location: [facility] INVASIVE CV LAB;  Service: Cardiovascular;  Laterality: Bilateral;  . PERIPHERAL VASCULAR CATHETERIZATION Right 09/05/2016   Procedure: Peripheral Vascular Balloon Angioplasty;  Surgeon: Sherren Kerns, MD;  Location: [facility] INVASIVE CV LAB;  Service: Cardiovascular;  Laterality: Right;  peroneal and AT  . SHOULDER ARTHROSCOPY WITH BICEPSTENOTOMY Right 05/10/2015   Procedure: RIGHT SHOULDER ARTHROSCOPY WITH BICEPS TENOTOMY, DEBRIDEMENT LABRAL TEAR;  Surgeon: Jones Broom, MD;  Location: [facility] OR;  Service: Orthopedics;  Laterality: Right;  Right shoulder arthroscopy biceps tenotomy, debridement labral tear  . TONSILLECTOMY  1997  . TUBAL LIGATION  2006   Social History   Occupational  History  . Student    Social History Main Topics  . Smoking status: Never Smoker  . Smokeless tobacco: Never Used  . Alcohol use No  . Drug use: No  . Sexual activity: Not Currently    Partners: Male    Birth control/ protection: Other-see comments     Comment: S/P tubal ligation

## 2016-12-19 NOTE — Telephone Encounter (Signed)
PT STATED SHE NEEDS THE MEDICATION THAT WAS PRESCRIBED TODAY AND WANTED [place] TO KNOW THAT IF YOU SEND THE PRIOR AUTH ELECTRONICALLY IT WOULD BE APPROVED.Marland KitchenMarland Kitchen

## 2016-12-19 NOTE — Telephone Encounter (Signed)
This was sent for prior auth original form on desk.

## 2016-12-22 ENCOUNTER — Telehealth (INDEPENDENT_AMBULATORY_CARE_PROVIDER_SITE_OTHER): Payer: Self-pay | Admitting: Orthopedic Surgery

## 2016-12-22 ENCOUNTER — Telehealth (INDEPENDENT_AMBULATORY_CARE_PROVIDER_SITE_OTHER): Payer: Self-pay | Admitting: *Deleted

## 2016-12-22 NOTE — Telephone Encounter (Signed)
Called as this was resubmitted on Friday and insurance company states it has not been received. Will refax.

## 2016-12-22 NOTE — Telephone Encounter (Signed)
Patient called asking if the pre authorization has been faxed to the pharmacy. CB # [phone]

## 2016-12-22 NOTE — Telephone Encounter (Signed)
Pt calling asking for a call back about PA for her medication

## 2016-12-22 NOTE — Telephone Encounter (Signed)
I called [place] [facility] and it was not received, this was faxed on Friday. This was resubmitted again today. We will recheck tomorrow.

## 2016-12-23 NOTE — Telephone Encounter (Signed)
I called [place] [facility] line and her pain medication has been approved.  The prior auth # is [id number] and the medication has been approved from 12/22/16 through 01/21/17. I called the pt and lm on vm to advise of the approval. To call with questions.

## 2016-12-23 NOTE — Telephone Encounter (Signed)
This was approved today, Autumn left voicemail for patient advising this.

## 2016-12-24 ENCOUNTER — Telehealth (INDEPENDENT_AMBULATORY_CARE_PROVIDER_SITE_OTHER): Payer: Self-pay

## 2016-12-24 ENCOUNTER — Other Ambulatory Visit (INDEPENDENT_AMBULATORY_CARE_PROVIDER_SITE_OTHER): Payer: Self-pay | Admitting: Family

## 2016-12-24 MED ORDER — OXYCODONE HCL 5 MG PO TABS: 5.0000 mg | ORAL_TABLET | Freq: Two times a day (BID) | ORAL | 0 refills | Status: DC | PRN

## 2016-12-24 NOTE — Telephone Encounter (Signed)
Pt called and wanted to know if her medication had been authorized. I advised that we received approval yesterday and should be able to run at the pharm. I called the pharm and they advised that the pt had just picked up an rx for oxycodone 5mg  on Monday and refill would not be possible until Tuesday. The pt also had an rx for percocet #60 pending and I advised the pharm not to fill this. I advised the pt that she will have to hold filling rx until Tuesday and that the it will be for the oxycodone only. Can not have both rx. Pt will come to the office to pick up new rx and this has been approved by her insurance.

## 2016-12-25 ENCOUNTER — Telehealth (INDEPENDENT_AMBULATORY_CARE_PROVIDER_SITE_OTHER): Payer: Self-pay | Admitting: Orthopedic Surgery

## 2016-12-25 NOTE — Telephone Encounter (Signed)
I called [name], advised this was faxed to number provided on [phone]

## 2016-12-25 NOTE — Telephone Encounter (Signed)
Raquel Sarna [facility]) called to check progress on the application she dropped off for [facility]. The number to contact Dorothy Puffer is [phone]

## 2016-12-29 ENCOUNTER — Ambulatory Visit
Admission: RE | Admit: 2016-12-29 | Discharge: 2016-12-29 | Disposition: A | Discharge status: Home / Self Care | Payer: Medicaid Other | Source: Ambulatory Visit | Attending: Surgery | Admitting: Surgery

## 2016-12-29 ENCOUNTER — Other Ambulatory Visit: Payer: Self-pay | Admitting: Surgery

## 2016-12-29 ENCOUNTER — Encounter: Payer: Medicaid Other | Attending: Surgery | Admitting: Surgery

## 2016-12-29 DIAGNOSIS — T8131XA Disruption of external operation (surgical) wound, not elsewhere classified, initial encounter: Secondary | ICD-10-CM | POA: Diagnosis not present

## 2016-12-29 DIAGNOSIS — T8744 Infection of amputation stump, left lower extremity: Secondary | ICD-10-CM | POA: Insufficient documentation

## 2016-12-29 DIAGNOSIS — Z992 Dependence on renal dialysis: Secondary | ICD-10-CM | POA: Diagnosis not present

## 2016-12-29 DIAGNOSIS — Z6836 Body mass index (BMI) 36.0-36.9, adult: Secondary | ICD-10-CM | POA: Diagnosis not present

## 2016-12-29 DIAGNOSIS — G4730 Sleep apnea, unspecified: Secondary | ICD-10-CM | POA: Insufficient documentation

## 2016-12-29 DIAGNOSIS — S81809A Unspecified open wound, unspecified lower leg, initial encounter: Secondary | ICD-10-CM

## 2016-12-29 DIAGNOSIS — S91302A Unspecified open wound, left foot, initial encounter: Secondary | ICD-10-CM | POA: Diagnosis present

## 2016-12-29 DIAGNOSIS — T8789 Other complications of amputation stump: Secondary | ICD-10-CM | POA: Diagnosis not present

## 2016-12-29 DIAGNOSIS — X58XXXA Exposure to other specified factors, initial encounter: Secondary | ICD-10-CM | POA: Diagnosis not present

## 2016-12-29 DIAGNOSIS — E7800 Pure hypercholesterolemia, unspecified: Secondary | ICD-10-CM | POA: Diagnosis not present

## 2016-12-29 DIAGNOSIS — N186 End stage renal disease: Secondary | ICD-10-CM | POA: Diagnosis not present

## 2016-12-29 DIAGNOSIS — I70245 Atherosclerosis of native arteries of left leg with ulceration of other part of foot: Secondary | ICD-10-CM | POA: Diagnosis not present

## 2016-12-29 DIAGNOSIS — Z79899 Other long term (current) drug therapy: Secondary | ICD-10-CM | POA: Insufficient documentation

## 2016-12-29 DIAGNOSIS — M19042 Primary osteoarthritis, left hand: Secondary | ICD-10-CM | POA: Diagnosis not present

## 2016-12-29 DIAGNOSIS — I509 Heart failure, unspecified: Secondary | ICD-10-CM | POA: Diagnosis not present

## 2016-12-29 DIAGNOSIS — I429 Cardiomyopathy, unspecified: Secondary | ICD-10-CM | POA: Insufficient documentation

## 2016-12-29 DIAGNOSIS — J449 Chronic obstructive pulmonary disease, unspecified: Secondary | ICD-10-CM | POA: Diagnosis not present

## 2016-12-29 DIAGNOSIS — D649 Anemia, unspecified: Secondary | ICD-10-CM | POA: Diagnosis not present

## 2016-12-29 DIAGNOSIS — I132 Hypertensive heart and chronic kidney disease with heart failure and with stage 5 chronic kidney disease, or end stage renal disease: Secondary | ICD-10-CM | POA: Diagnosis not present

## 2016-12-29 DIAGNOSIS — E10621 Type 1 diabetes mellitus with foot ulcer: Secondary | ICD-10-CM | POA: Insufficient documentation

## 2016-12-29 DIAGNOSIS — Y839 Surgical procedure, unspecified as the cause of abnormal reaction of the patient, or of later complication, without mention of misadventure at the time of the procedure: Secondary | ICD-10-CM | POA: Diagnosis not present

## 2016-12-29 DIAGNOSIS — E1022 Type 1 diabetes mellitus with diabetic chronic kidney disease: Secondary | ICD-10-CM | POA: Insufficient documentation

## 2016-12-29 DIAGNOSIS — Z794 Long term (current) use of insulin: Secondary | ICD-10-CM | POA: Insufficient documentation

## 2016-12-29 DIAGNOSIS — L97522 Non-pressure chronic ulcer of other part of left foot with fat layer exposed: Secondary | ICD-10-CM | POA: Diagnosis not present

## 2016-12-30 NOTE — Progress Notes (Signed)
[name], [name] ([id number]) Visit Report for 12/29/2016 Allergy List Details Patient Name: [name] Date of Service: 12/29/2016 12:45 PM Medical Record Number: [id number] Patient Account Number: [id number] Date of Birth/Sex: [birth year] (35 y.o. Female) Treating RN: Huel Coventry Primary Care Eris Breck: [name] Other Clinician: Referring Khyle Goodell: [name] Treating Dietrich Ke/Extender: Rudene Re in Treatment: 0 Allergies Active Allergies sulfur asprin tramadol Allergy Notes Electronic Signature(s) Signed: 12/30/2016 1:43:54 PM By: Elliot Gurney, RN, BSN, Kim RN, BSN Entered By: Elliot Gurney, RN, BSN, Kim on 12/29/2016 13:17:09 [name] ([id number]) -------------------------------------------------------------------------------- Arrival Information Details Patient Name: [name] Date of Service: 12/29/2016 12:45 PM Medical Record Number: [id number] Patient Account Number: [id number] Date of Birth/Sex: 01-23-1981 (35 y.o. Female) Treating RN: Huel Coventry Primary Care Loyd Marhefka: [name] Other Clinician: Referring Haddy Mullinax: [name] Treating Shacora Zynda/Extender: Rudene Re in Treatment: 0 Visit Information Patient Arrived: Wheel Chair Arrival Time: 13:01 Accompanied By: mom Transfer Assistance: None Patient Identification Verified: Yes Secondary Verification Process Yes Completed: Patient Has Alerts: Yes Patient Alerts: Patient on Blood Thinner Plavix History Since Last Visit Added or deleted any medications: No Any new allergies or adverse reactions: No Hospitalized since last visit: Yes Electronic Signature(s) Signed: 12/30/2016 1:43:54 PM By: Elliot Gurney, RN, BSN, Kim RN, BSN Entered By: Elliot Gurney, RN, BSN, Kim on 12/29/2016 13:02:45 [name] ([id number]) -------------------------------------------------------------------------------- Clinic Level of Care Assessment Details Patient Name: [name] Date of Service: 12/29/2016 12:45 PM Medical Record  Number: [id number] Patient Account Number: [id number] Date of Birth/Sex: Nov 29, 1980 (35 y.o. Female) Treating RN: Huel Coventry Primary Care Torrence Branagan: [name] Other Clinician: Referring Anesia Blackwell: [name] Treating Haani Bakula/Extender: Rudene Re in Treatment: 0 Clinic Level of Care Assessment Items TOOL 1 Quantity Score []  - Use when EandM and Procedure is performed on INITIAL visit 0 ASSESSMENTS - Nursing Assessment / Reassessment X - General Physical Exam (combine w/ comprehensive assessment (listed just 1 20 below) when performed on new pt. evals) X - Comprehensive Assessment (HX, ROS, Risk Assessments, Wounds Hx, etc.) 1 25 ASSESSMENTS - Wound and Skin Assessment / Reassessment []  - Dermatologic / Skin Assessment (not related to wound area) 0 ASSESSMENTS - Ostomy and/or Continence Assessment and Care []  - Incontinence Assessment and Management 0 []  - Ostomy Care Assessment and Management (repouching, etc.) 0 PROCESS - Coordination of Care X - Simple Patient / Family Education for ongoing care 1 15 []  - Complex (extensive) Patient / Family Education for ongoing care 0 X - Staff obtains Consents, Records, Test Results / Process Orders 1 10 []  - Staff telephones HHA, Nursing Homes / Clarify orders / etc 0 []  - Routine Transfer to another Facility (non-emergent condition) 0 []  - Routine Hospital Admission (non-emergent condition) 0 X - New Admissions / Manufacturing engineer / Ordering NPWT, Apligraf, etc. 1 15 []  - Emergency Hospital Admission (emergent condition) 0 PROCESS - Special Needs []  - Pediatric / Minor Patient Management 0 []  - Isolation Patient Management 0 Beth, [name] ([id number]) []  - Hearing / Language / Visual special needs 0 []  - Assessment of Community assistance (transportation, D/C planning, etc.) 0 []  - Additional assistance / Altered mentation 0 []  - Support Surface(s) Assessment (bed, cushion, seat, etc.) 0 INTERVENTIONS -  Miscellaneous []  - External ear exam 0 []  - Patient Transfer (multiple staff / Nurse, adult / Similar devices) 0 []  - Simple Staple / Suture removal (25 or less) 0 []  - Complex Staple / Suture removal (26 or more) 0 []  - Hypo/Hyperglycemic Management (do not check if billed separately) 0 []  -  Ankle / Brachial Index (ABI) - do not check if billed separately 0 Has the patient been seen at the hospital within the last three years: Yes Total Score: 85 Level Of Care: New/Established - Level 3 Electronic Signature(s) Signed: 12/30/2016 1:43:54 PM By: Elliot Gurney, RN, BSN, Kim RN, BSN Entered By: Elliot Gurney, RN, BSN, Kim on 12/29/2016 16:24:43 [name] ([id number]) -------------------------------------------------------------------------------- Encounter Discharge Information Details Patient Name: [name] Date of Service: 12/29/2016 12:45 PM Medical Record Number: [id number] Patient Account Number: [id number] Date of Birth/Sex: 09/10/1981 (35 y.o. Female) Treating RN: Huel Coventry Primary Care Eliah Ozawa: [name] Other Clinician: Referring Kiah Vanalstine: [name] Treating Leisl Spurrier/Extender: Rudene Re in Treatment: 0 Encounter Discharge Information Items Discharge Pain Level: 0 Discharge Condition: Stable Ambulatory Status: Wheelchair Discharge Destination: Home Transportation: Private Auto Accompanied By: mom Schedule Follow-up Appointment: Yes Medication Reconciliation completed Yes and provided to Patient/Care Linsie Lupo: Provided on Clinical Summary of Care: 12/29/2016 Form Type Recipient Paper Patient IG Electronic Signature(s) Signed: 12/30/2016 1:43:54 PM By: Elliot Gurney RN, BSN, Kim RN, BSN Previous Signature: 12/29/2016 1:59:54 PM Version By: Gwenlyn Perking Entered By: Elliot Gurney RN, BSN, Kim on 12/29/2016 16:25:59 [name] ([id number]) -------------------------------------------------------------------------------- Lower Extremity Assessment Details Patient Name: [name] Date of Service: 12/29/2016 12:45 PM Medical Record Number: [id number] Patient Account Number: [id number] Date of Birth/Sex: 04/21/1981 (35 y.o. Female) Treating RN: Huel Coventry Primary Care Falon Flinchum: [name] Other Clinician: Referring Tobias Avitabile: [name] Treating Carolyna Yerian/Extender: Rudene Re in Treatment: 0 Edema Assessment Assessed: [Left: No] [Right: No] Edema: [Left: N] [Right: o] Vascular Assessment Pulses: Dorsalis Pedis Palpable: [Left:No] Doppler Audible: [Left:Yes] Posterior Tibial Palpable: [Left:No] Doppler Audible: [Left:Inaudible] Extremity colors, hair growth, and conditions: Extremity Color: [Left:Normal] Hair Growth on Extremity: [Left:No] Temperature of Extremity: [Left:Warm] Dependent Rubor: [Left:No] Blanched when Elevated: [Left:No] Lipodermatosclerosis: [Left:No] Notes bilateral transmet amputations Electronic Signature(s) Signed: 12/30/2016 1:43:54 PM By: Elliot Gurney, RN, BSN, Kim RN, BSN Entered By: Elliot Gurney, RN, BSN, Kim on 12/29/2016 13:16:56 [name] ([id number]) -------------------------------------------------------------------------------- Multi Wound Chart Details Patient Name: [name] Date of Service: 12/29/2016 12:45 PM Medical Record Number: [id number] Patient Account Number: [id number] Date of Birth/Sex: 21-Sep-1981 (35 y.o. Female) Treating RN: Huel Coventry Primary Care Charley Lafrance: [name] Other Clinician: Referring Dawsyn Zurn: [name] Treating Arrington Bencomo/Extender: Rudene Re in Treatment: 0 Vital Signs Height(in): 68 Pulse(bpm): 68 Weight(lbs): 240 Blood Pressure 110/88 (mmHg): Body Mass Index(BMI): 36 Temperature(F): 98.7 Respiratory Rate 16 (breaths/min): Photos: Wound Location: Left Amputation Site - Left Malleolus Left Amputation Site - Transmetatarsal Transmetatarsal - Lateral Wounding Event: Surgical Injury Gradually Appeared Surgical Injury Primary Etiology: Refractory  Osteomyelitis Pressure Ulcer Dehisced Wound Date Acquired: 11/25/2016 08/27/2016 12/16/2016 Weeks of Treatment: 0 0 0 Wound Status: Open Open Open Measurements L x W x D 1.5x2.4x0.5 2.5x2x0.1 1.5x2.6x0.2 (cm) Area (cm) : 2.827 3.927 3.063 Volume (cm) : 1.414 0.393 0.613 % Reduction in Area: 0.00% 0.00% N/A % Reduction in Volume: 0.00% 0.00% N/A Classification: Full Thickness Without Unstageable/Unclassified Full Thickness Without Exposed Support Exposed Support Structures Structures Exudate Amount: Medium None Present Large Exudate Type: Serous N/A Serous Exudate Color: amber N/A amber Wound Margin: Flat and Intact Flat and Intact Flat and Intact Granulation Amount: None Present (0%) N/A None Present (0%) Necrotic Amount: Large (67-100%) N/A Large (67-100%) Necrotic Tissue: Adherent [employer], Adherent Slough Exposed Structures: Oakman, [name] ([id number]) Fat Layer (Subcutaneous Fascia: No Fat Layer (Subcutaneous Tissue) Exposed: Yes Fat Layer (Subcutaneous Tissue) Exposed: Yes Fascia: No Tissue) Exposed: No Fascia: No Tendon: No Tendon: No Tendon: No Muscle: No  Muscle: No Muscle: No Joint: No Joint: No Joint: No Bone: No Bone: No Bone: No Limited to Skin Breakdown Epithelialization: None None None Periwound Skin Texture: Excoriation: No No Abnormalities Noted Scarring: Yes Induration: No Excoriation: No Callus: No Induration: No Crepitus: No Callus: No Rash: No Crepitus: No Scarring: No Rash: No Periwound Skin Maceration: Yes No Abnormalities Noted Maceration: No Moisture: Dry/Scaly: No Dry/Scaly: No Periwound Skin Color: Erythema: Yes No Abnormalities Noted Atrophie Blanche: No Atrophie Blanche: No Cyanosis: No Cyanosis: No Ecchymosis: No Ecchymosis: No Erythema: No Hemosiderin Staining: No Hemosiderin Staining: No Mottled: No Mottled: No Pallor: No Pallor: No Rubor: No Rubor: No Tenderness on No No No Palpation: Wound  Preparation: Ulcer Cleansing: Ulcer Cleansing: Ulcer Cleansing: Rinsed/Irrigated with Rinsed/Irrigated with Rinsed/Irrigated with Saline Saline Saline Topical Anesthetic Topical Anesthetic Topical Anesthetic Applied: Other: llidocaine Applied: None Applied: None 4% Treatment Notes Electronic Signature(s) Signed: 12/29/2016 1:58:26 PM By: Evlyn Kanner MD, FACS Entered By: Evlyn Kanner on 12/29/2016 13:58:25 [name] ([id number]) -------------------------------------------------------------------------------- Multi-Disciplinary Care Plan Details Patient Name: [name] Date of Service: 12/29/2016 12:45 PM Medical Record Number: [id number] Patient Account Number: [id number] Date of Birth/Sex: 1981/01/02 (35 y.o. Female) Treating RN: Huel Coventry Primary Care Kimberlyann Hollar: [name] Other Clinician: Referring Jashawna Reever: [name] Treating Viraaj Vorndran/Extender: Rudene Re in Treatment: 0 Active Inactive ` Necrotic Tissue Nursing Diagnoses: Impaired tissue integrity related to necrotic/devitalized tissue Goals: Necrotic/devitalized tissue will be minimized in the wound bed Date Initiated: 12/29/2016 Target Resolution Date: 03/30/2017 Goal Status: Active Interventions: Assess patient pain level pre-, during and post procedure and prior to discharge Treatment Activities: Apply topical anesthetic as ordered : 12/29/2016 Enzymatic debridement : 12/29/2016 Notes: ` Orientation to the Wound Care Program Nursing Diagnoses: Knowledge deficit related to the wound healing center program Goals: Patient/caregiver will verbalize understanding of the Wound Healing Center Program Date Initiated: 12/29/2016 Target Resolution Date: 03/30/2017 Goal Status: Active Interventions: Provide education on orientation to the wound center Notes: ` Soft Tissue Infection [name], [name] ([id number]) Nursing Diagnoses: Impaired tissue integrity Potential for infection: soft tissue Goals: Patient  will remain free of wound infection Date Initiated: 12/29/2016 Target Resolution Date: 03/30/2017 Goal Status: Active Patient's soft tissue infection will resolve Date Initiated: 12/29/2016 Target Resolution Date: 03/30/2017 Goal Status: Active Interventions: Assess signs and symptoms of infection every visit Treatment Activities: Culture and sensitivity : 12/29/2016 Systemic antibiotics : 12/29/2016 Notes: Electronic Signature(s) Signed: 12/30/2016 1:43:54 PM By: Elliot Gurney, RN, BSN, Kim RN, BSN Entered By: Elliot Gurney, RN, BSN, Kim on 12/29/2016 13:45:17 [name] ([id number]) -------------------------------------------------------------------------------- Pain Assessment Details Patient Name: [name] Date of Service: 12/29/2016 12:45 PM Medical Record Number: [id number] Patient Account Number: [id number] Date of Birth/Sex: 02-Mar-1981 (35 y.o. Female) Treating RN: Huel Coventry Primary Care Jaquelinne Glendening: [name] Other Clinician: Referring Rhealynn Myhre: [name] Treating Rami Waddle/Extender: Rudene Re in Treatment: 0 Active Problems Location of Pain Severity and Description of Pain Patient Has Paino Yes Site Locations Pain Location: Pain in Ulcers With Dressing Change: Yes Duration of the Pain. Constant / Intermittento Constant Rate the pain. Current Pain Level: 9 Character of Pain Describe the Pain: Heavy, Sharp, Tender, Throbbing Pain Management and Medication Current Pain Management: Medication: Yes Notes Topical or injectable lidocaine is offered to patient for acute pain when surgical debridement is performed. If needed, Patient is instructed to use over the counter pain medication for the following 24-48 hours after debridement. Wound care MDs do not prescribed pain medications. Patient has chronic pain or uncontrolled pain. Patient has been instructed to  make an appointment with their Primary Care Physician for pain management. Electronic Signature(s) Signed:  12/30/2016 1:43:54 PM By: Elliot Gurney, RN, BSN, Kim RN, BSN Entered By: Elliot Gurney, RN, BSN, Kim on 12/29/2016 13:04:01 [name] ([id number]) -------------------------------------------------------------------------------- Patient/Caregiver Education Details Patient Name: [name] Date of Service: 12/29/2016 12:45 PM Medical Record Number: [id number] Patient Account Number: [id number] Date of Birth/Gender: [birth year] (35 y.o. Female) Treating RN: Huel Coventry Primary Care Physician: [name] Other Clinician: Referring Physician: [name] Treating Physician/Extender: Rudene Re in Treatment: 0 Education Assessment Education Provided To: Patient Education Topics Provided Welcome To The Wound Care Center: Handouts: Welcome To The Wound Care Center Methods: Demonstration, Explain/Verbal Responses: State content correctly Wound/Skin Impairment: Handouts: Caring for Your Ulcer Methods: Demonstration, Explain/Verbal Responses: State content correctly Electronic Signature(s) Signed: 12/30/2016 1:43:54 PM By: Elliot Gurney, RN, BSN, Kim RN, BSN Entered By: Elliot Gurney, RN, BSN, Kim on 12/29/2016 16:26:27 [name] ([id number]) -------------------------------------------------------------------------------- Wound Assessment Details Patient Name: [name] Date of Service: 12/29/2016 12:45 PM Medical Record Number: [id number] Patient Account Number: [id number] Date of Birth/Sex: [birth year] (35 y.o. Female) Treating RN: Huel Coventry Primary Care Delila Kuklinski: [name] Other Clinician: Referring Mohit Zirbes: [name] Treating Hallel Denherder/Extender: Rudene Re in Treatment: 0 Wound Status Wound Number: 4 Primary Etiology: Refractory Osteomyelitis Wound Location: Left Amputation Site - Wound Status: Open Transmetatarsal Wounding Event: Surgical Injury Date Acquired: 11/25/2016 Weeks Of Treatment: 0 Clustered Wound: No Photos Wound Measurements Length: (cm) 1.5 % Reduction  in A Width: (cm) 2.4 % Reduction in V Depth: (cm) 0.5 Epithelializatio Area: (cm) 2.827 Tunneling: Volume: (cm) 1.414 Undermining: rea: 0% olume: 0% n: None No No Wound Description Full Thickness Without Exposed Foul Odor After Classification: Support Structures Slough/Fibrino Wound Margin: Flat and Intact Exudate Medium Amount: Exudate Type: Serous Exudate Color: amber Cleansing: No Yes Wound Bed Granulation Amount: None Present (0%) Exposed Structure Necrotic Amount: Large (67-100%) Fascia Exposed: No Necrotic Quality: Adherent Slough Fat Layer (Subcutaneous Tissue) Exposed: Yes Tendon Exposed: No Muscle Exposed: No Crounse, [name] ([id number]) Joint Exposed: No Bone Exposed: No Periwound Skin Texture Texture Color No Abnormalities Noted: No No Abnormalities Noted: No Callus: No Atrophie Blanche: No Crepitus: No Cyanosis: No Excoriation: No Ecchymosis: No Induration: No Erythema: Yes Rash: No Hemosiderin Staining: No Scarring: No Mottled: No Pallor: No Moisture Rubor: No No Abnormalities Noted: No Dry / Scaly: No Maceration: Yes Wound Preparation Ulcer Cleansing: Rinsed/Irrigated with Saline Topical Anesthetic Applied: Other: llidocaine 4%, Treatment Notes Wound #4 (Left Amputation Site - Transmetatarsal) 1. Cleansed with: Clean wound with Normal Saline 2. Anesthetic Topical Lidocaine 4% cream to wound bed prior to debridement 4. Dressing Applied: Santyl Ointment 5. Secondary Dressing Applied Gauze and Kerlix/Conform 7. Secured with Tape Notes heel cup Electronic Signature(s) Signed: 12/30/2016 1:43:54 PM By: Elliot Gurney, RN, BSN, Kim RN, BSN Entered By: Elliot Gurney, RN, BSN, Kim on 12/29/2016 13:15:46 [name] ([id number]) -------------------------------------------------------------------------------- Wound Assessment Details Patient Name: [name] Date of Service: 12/29/2016 12:45 PM Medical Record Number: [id number] Patient Account Number:  [id number] Date of Birth/Sex: 1981-03-11 (35 y.o. Female) Treating RN: Huel Coventry Primary Care Raejean Swinford: [name] Other Clinician: Referring Dowell Hoon: [name] Treating Yuval Nolet/Extender: Rudene Re in Treatment: 0 Wound Status Wound Number: 5 Primary Etiology: Pressure Ulcer Wound Location: Left Malleolus Wound Status: Open Wounding Event: Gradually Appeared Date Acquired: 08/27/2016 Weeks Of Treatment: 0 Clustered Wound: No Photos Wound Measurements Length: (cm) 2.5 % Reduction in A Width: (cm) 2 % Reduction in V Depth: (cm) 0.1 Epithelializatio Area: (cm) 3.927 Tunneling: Volume: (  cm) 0.393 Undermining: rea: 0% olume: 0% n: None No No Wound Description Classification: Unstageable/Unclassified Wound Margin: Flat and Intact Exudate Amount: None Present Foul Odor After Cleansing: No Slough/Fibrino No Wound Bed Necrotic Amount: Large (67-100%) Exposed Structure Necrotic Quality: Eschar Fascia Exposed: No Fat Layer (Subcutaneous Tissue) Exposed: No Tendon Exposed: No Muscle Exposed: No Joint Exposed: No Bone Exposed: No Limited to Skin Breakdown Periwound Skin Texture [name], Beth ([id number]) Texture Color No Abnormalities Noted: No No Abnormalities Noted: No Moisture No Abnormalities Noted: No Wound Preparation Ulcer Cleansing: Rinsed/Irrigated with Saline Topical Anesthetic Applied: None Treatment Notes Wound #5 (Left Malleolus) 1. Cleansed with: Clean wound with Normal Saline 2. Anesthetic Topical Lidocaine 4% cream to wound bed prior to debridement 4. Dressing Applied: Santyl Ointment 5. Secondary Dressing Applied Gauze and Kerlix/Conform 7. Secured with Tape Notes heel cup Electronic Signature(s) Signed: 12/30/2016 1:43:54 PM By: Elliot Gurney, RN, BSN, Kim RN, BSN Entered By: Elliot Gurney, RN, BSN, Kim on 12/29/2016 13:14:32 [name]  ([id number]) -------------------------------------------------------------------------------- Wound Assessment Details Patient Name: [name] Date of Service: 12/29/2016 12:45 PM Medical Record Number: [id number] Patient Account Number: [id number] Date of Birth/Sex: 05-10-1981 (35 y.o. Female) Treating RN: Huel Coventry Primary Care Kaia Depaolis: [name] Other Clinician: Referring Hiawatha Dressel: [name] Treating Marita Burnsed/Extender: Rudene Re in Treatment: 0 Wound Status Wound Number: 6 Primary Etiology: Dehisced Wound Wound Location: Left Amputation Site - Wound Status: Open Transmetatarsal - Lateral Wounding Event: Surgical Injury Date Acquired: 12/16/2016 Weeks Of Treatment: 0 Clustered Wound: No Photos Wound Measurements Length: (cm) 1.5 Width: (cm) 2.6 Depth: (cm) 0.2 Area: (cm) 3.063 Volume: (cm) 0.613 % Reduction in Area: % Reduction in Volume: Epithelialization: None Tunneling: No Undermining: No Wound Description Full Thickness Without Exposed Foul Odor After Classification: Support Structures Slough/Fibrino Wound Margin: Flat and Intact Exudate Large Amount: Exudate Type: Serous Exudate Color: amber Cleansing: No Yes Wound Bed Granulation Amount: None Present (0%) Exposed Structure Necrotic Amount: Large (67-100%) Fascia Exposed: No Necrotic Quality: Eschar, Adherent Slough Fat Layer (Subcutaneous Tissue) Exposed: Yes Tendon Exposed: No Muscle Exposed: No [name], [name] ([id number]) Joint Exposed: No Bone Exposed: No Periwound Skin Texture Texture Color No Abnormalities Noted: No No Abnormalities Noted: No Callus: No Atrophie Blanche: No Crepitus: No Cyanosis: No Excoriation: No Ecchymosis: No Induration: No Erythema: No Rash: No Hemosiderin Staining: No Scarring: Yes Mottled: No Pallor: No Moisture Rubor: No No Abnormalities Noted: No Dry / Scaly: No Maceration: No Wound Preparation Ulcer Cleansing: Rinsed/Irrigated  with Saline Topical Anesthetic Applied: None Treatment Notes Wound #6 (Left, Lateral Amputation Site - Transmetatarsal) 1. Cleansed with: Clean wound with Normal Saline 2. Anesthetic Topical Lidocaine 4% cream to wound bed prior to debridement 4. Dressing Applied: Santyl Ointment 5. Secondary Dressing Applied Gauze and Kerlix/Conform 7. Secured with Tape Notes heel cup Electronic Signature(s) Signed: 12/30/2016 1:43:54 PM By: Elliot Gurney, RN, BSN, Kim RN, BSN Entered By: Elliot Gurney, RN, BSN, Kim on 12/29/2016 13:14:02 [name] ([id number]) -------------------------------------------------------------------------------- Vitals Details Patient Name: [name] Date of Service: 12/29/2016 12:45 PM Medical Record Number: [id number] Patient Account Number: [id number] Date of Birth/Sex: [birth year] (35 y.o. Female) Treating RN: Huel Coventry Primary Care Lott Seelbach: [name] Other Clinician: Referring Chico Cawood: [name] Treating Yulisa Chirico/Extender: Rudene Re in Treatment: 0 Vital Signs Time Taken: 12:55 Temperature (F): 98.7 Height (in): 68 Pulse (bpm): 68 Weight (lbs): 240 Respiratory Rate (breaths/min): 16 Body Mass Index (BMI): 36.5 Blood Pressure (mmHg): 110/88 Reference Range: 80 - 120 mg / dl Electronic Signature(s) Signed: 12/30/2016 1:43:54 PM By: Elliot Gurney, RN, BSN,  Selena Batten RN, BSN Entered By: Elliot Gurney, RN, BSN, Kim on 12/29/2016 13:04:31

## 2016-12-30 NOTE — Progress Notes (Signed)
[name] ([id number]) Visit Report for 12/29/2016 Abuse/Suicide Risk Screen Details Patient Name: [name] Date of Service: 12/29/2016 12:45 PM Medical Record Number: [id number] Patient Account Number: [id number] Date of Birth/Sex: June 14, 1981 (35 y.o. Female) Treating RN: Huel Coventry Primary Care Karyn Brull: [name] Other Clinician: Referring Conchita Truxillo: [name] Treating Alani Lacivita/Extender: Rudene Re in Treatment: 0 Abuse/Suicide Risk Screen Items Answer ABUSE/SUICIDE RISK SCREEN: Has anyone close to you tried to hurt or harm you recentlyo No Do you feel uncomfortable with anyone in your familyo No Has anyone forced you do things that you didnot want to doo No Do you have any thoughts of harming yourselfo No Patient displays signs or symptoms of abuse and/or neglect. No Electronic Signature(s) Signed: 12/30/2016 1:43:54 PM By: Elliot Gurney, RN, BSN, Kim RN, BSN Entered By: Elliot Gurney, RN, BSN, Kim on 12/29/2016 13:24:18 [name] ([id number]) -------------------------------------------------------------------------------- Activities of Daily Living Details Patient Name: [name] Date of Service: 12/29/2016 12:45 PM Medical Record Number: [id number] Patient Account Number: [id number] Date of Birth/Sex: November 01, 1980 (35 y.o. Female) Treating RN: Huel Coventry Primary Care Brenn Deziel: [name] Other Clinician: Referring Tanesha Arambula: [name] Treating Aleeyah Bensen/Extender: Rudene Re in Treatment: 0 Activities of Daily Living Items Answer Activities of Daily Living (Please select one for each item) Drive Automobile Need Assistance Take Medications Need Assistance Use Telephone Need Assistance Care for Appearance Need Assistance Use Toilet Need Assistance Bath / Shower Completely Able Dress Self Completely Able Feed Self Completely Able Walk Completely Able Get In / Out Bed Completely Able Housework Completely Able Prepare Meals Completely Able Handle  Money Completely Able Shop for Self Completely Able Electronic Signature(s) Signed: 12/30/2016 1:43:54 PM By: Elliot Gurney, RN, BSN, Kim RN, BSN Entered By: Elliot Gurney, RN, BSN, Kim on 12/29/2016 13:24:52 [name] ([id number]) -------------------------------------------------------------------------------- Education Assessment Details Patient Name: [name] Date of Service: 12/29/2016 12:45 PM Medical Record Number: [id number] Patient Account Number: [id number] Date of Birth/Sex: 08-15-1981 (35 y.o. Female) Treating RN: Huel Coventry Primary Care Stepfon Rawles: [name] Other Clinician: Referring Ellanora Rayborn: [name] Treating Loreta Blouch/Extender: Rudene Re in Treatment: 0 Primary Learner Assessed: Patient Learning Preferences/Education Level/Primary Language Learning Preference: Explanation, Demonstration Highest Education Level: College or Above Preferred Language: English Cognitive Barrier Assessment/Beliefs Language Barrier: No Translator Needed: No Memory Deficit: No Emotional Barrier: No Cultural/Religious Beliefs Affecting Medical No Care: Physical Barrier Assessment Impaired Vision: No Impaired Hearing: No Decreased Hand dexterity: No Knowledge/Comprehension Assessment Knowledge Level: High Comprehension Level: High Ability to understand written High instructions: Ability to understand verbal High instructions: Motivation Assessment Anxiety Level: Calm Cooperation: Cooperative Education Importance: Acknowledges Need Interest in Health Problems: Asks Questions Perception: Coherent Willingness to Engage in Self- High Management Activities: Readiness to Engage in Self- High Management Activities: Electronic Signature(s) [name] ([id number]) Signed: 12/30/2016 1:43:54 PM By: Elliot Gurney, RN, BSN, Kim RN, BSN Entered By: Elliot Gurney, RN, BSN, Kim on 12/29/2016 13:25:45 [name]  ([id number]) -------------------------------------------------------------------------------- Fall Risk Assessment Details Patient Name: [name] Date of Service: 12/29/2016 12:45 PM Medical Record Number: [id number] Patient Account Number: [id number] Date of Birth/Sex: 02-08-1981 (35 y.o. Female) Treating RN: Huel Coventry Primary Care Brizeida Mcmurry: [name] Other Clinician: Referring Cristan Hout: [name] Treating Jessejames Steelman/Extender: Rudene Re in Treatment: 0 Fall Risk Assessment Items Have you had 2 or more falls in the last 12 monthso 0 No Have you had any fall that resulted in injury in the last 12 monthso 0 No FALL RISK ASSESSMENT: History of falling - immediate or within 3 months 0 No Secondary diagnosis 0 No Ambulatory  aid None/bed rest/wheelchair/nurse 0 Yes Crutches/cane/walker 0 No Furniture 0 No IV Access/Saline Lock 0 No Gait/Training Normal/bed rest/immobile 0 Yes Weak 0 No Impaired 0 No Mental Status Oriented to own ability 0 Yes Electronic Signature(s) Signed: 12/30/2016 1:43:54 PM By: Elliot Gurney, RN, BSN, Kim RN, BSN Entered By: Elliot Gurney, RN, BSN, Kim on 12/29/2016 13:25:56 [name] ([id number]) -------------------------------------------------------------------------------- Foot Assessment Details Patient Name: [name] Date of Service: 12/29/2016 12:45 PM Medical Record Number: [id number] Patient Account Number: [id number] Date of Birth/Sex: [birth year] (35 y.o. Female) Treating RN: Huel Coventry Primary Care Burman Bruington: [name] Other Clinician: Referring Eswin Worrell: [name] Treating Orvil Faraone/Extender: Rudene Re in Treatment: 0 Foot Assessment Items Site Locations + = Sensation present, - = Sensation absent, C = Callus, U = Ulcer R = Redness, W = Warmth, M = Maceration, PU = Pre-ulcerative lesion F = Fissure, S = Swelling, D = Dryness Assessment Right: Left: Other Deformity: No Yes Prior Foot Ulcer: No Yes Prior  Amputation: No Yes Charcot Joint: No No Ambulatory Status: Non-ambulatory Assistance Device: Wheelchair Gait: Surveyor, mining) Signed: 12/30/2016 1:43:54 PM By: Elliot Gurney, RN, BSN, Kim RN, BSN Entered By: Elliot Gurney, RN, BSN, Kim on 12/29/2016 13:28:02 [name] ([id number]) -------------------------------------------------------------------------------- Nutrition Risk Assessment Details Patient Name: [name] Date of Service: 12/29/2016 12:45 PM Medical Record Number: [id number] Patient Account Number: [id number] Date of Birth/Sex: [birth year] (35 y.o. Female) Treating RN: Huel Coventry Primary Care Torii Royse: [name] Other Clinician: Referring Kimiah Hibner: [name] Treating Masaru Chamberlin/Extender: Rudene Re in Treatment: 0 Height (in): 68 Weight (lbs): 240 Body Mass Index (BMI): 36.5 Nutrition Risk Assessment Items NUTRITION RISK SCREEN: I have an illness or condition that made me change the kind and/or 0 No amount of food I eat I eat fewer than two meals per day 0 No I eat few fruits and vegetables, or milk products 0 No I have three or more drinks of beer, liquor or wine almost every day 0 No I have tooth or mouth problems that make it hard for me to eat 0 No I don't always have enough money to buy the food I need 0 No I eat alone most of the time 0 No I take three or more different prescribed or over-the-counter drugs a 1 Yes day Without wanting to, I have lost or gained 10 pounds in the last six 0 No months I am not always physically able to shop, cook and/or feed myself 0 No Nutrition Protocols Good Risk Protocol 0 No interventions needed Moderate Risk Protocol Electronic Signature(s) Signed: 12/30/2016 1:43:54 PM By: Elliot Gurney, RN, BSN, Kim RN, BSN Entered By: Elliot Gurney, RN, BSN, Kim on 12/29/2016 13:26:44

## 2017-01-01 NOTE — Progress Notes (Signed)
RIM, THATCH ([id number]) Visit Report for 12/29/2016 Chief Complaint Document Details Patient Name: [name] Date of Service: 12/29/2016 12:45 PM Medical Record Number: [id number] Patient Account Number: [id number] Date of Birth/Sex: 01/27/1981 (35 y.o. Female) Treating RN: Huel Coventry Primary Care Provider: [name] Other Clinician: Referring Provider: [name] Treating Provider/Extender: Rudene Re in Treatment: 0 Information Obtained from: Patient Chief Complaint Patients presents for treatment of an open diabetic ulcer to the left transmetatarsal amputation site and this has been there since 10/08/2016 and has a left lateral heel decubitus ulcer since November 2017 Electronic Signature(s) Signed: 12/29/2016 1:59:17 PM By: Evlyn Kanner MD, FACS Previous Signature: 12/29/2016 1:58:33 PM Version By: Evlyn Kanner MD, FACS Entered By: Evlyn Kanner on 12/29/2016 13:59:17 [name] ([id number]) -------------------------------------------------------------------------------- Debridement Details Patient Name: [name] Date of Service: 12/29/2016 12:45 PM Medical Record Number: [id number] Patient Account Number: [id number] Date of Birth/Sex: 05-15-1981 (35 y.o. Female) Treating RN: Huel Coventry Primary Care Provider: [name] Other Clinician: Referring Provider: [name] Treating Provider/Extender: Rudene Re in Treatment: 0 Debridement Performed for Wound #4 Left Amputation Site - Transmetatarsal Assessment: Performed By: Cletus Gash, RN Debridement: Chemical/enzymatic Debridement Non-Selective Description: Pre-procedure No Verification/Time Out Taken: Procedural Pain: 0 Post Procedural Pain: 0 Response to Treatment: Procedure was tolerated well Post Debridement Measurements of Total Wound Length: (cm) 1.5 Width: (cm) 2.4 Depth: (cm) 0.5 Volume: (cm) 1.414 Character of Wound/Ulcer Post Requires Further  Debridement Debridement: Severity of Tissue Post Debridement: Fat layer exposed Post Procedure Diagnosis Same as Pre-procedure Electronic Signature(s) Signed: 12/30/2016 1:43:54 PM By: Elliot Gurney, RN, BSN, Kim RN, BSN Signed: 12/31/2016 7:34:44 AM By: Evlyn Kanner MD, FACS Entered By: Elliot Gurney RN, BSN, Kim on 12/30/2016 09:05:31 [name] ([id number]) -------------------------------------------------------------------------------- HPI Details Patient Name: [name] Date of Service: 12/29/2016 12:45 PM Medical Record Number: [id number] Patient Account Number: [id number] Date of Birth/Sex: 1981-07-24 (35 y.o. Female) Treating RN: Huel Coventry Primary Care Provider: [name] Other Clinician: Referring Provider: [name] Treating Provider/Extender: Rudene Re in Treatment: 0 History of Present Illness Location: open wound right medial foot where recent amputation was done and skin ulcers on the left third and fourth toe Quality: Patient reports experiencing a sharp pain to affected area(s). Severity: Patient states wound are getting worse. Duration: Patient has had the wound for < 2 weeks prior to presenting for treatment Timing: Pain in wound is constant (hurts all the time) Context: The wound occurred when the patient had gangrene of the right first toe and was admitted to the hospital Modifying Factors: Other treatment(s) tried include:doxycycline and Santyl ointment Associated Signs and Symptoms: Patient reports having foul odor. HPI Description: 36 year old patient who was seen by me on 2 different occasions in October/November 2017, has now come back to the wound center. 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 She also has a ulcer on the left heel which has been there since November 2017. Review of her electronic medical records revealed that she had a angiogram done by Dr. Fabienne Bruns  on 09/05/2016 --he did an abdominal aortogram with bilateral lower extremity runoffs, angioplasty of the right anterior tibial artery, angioplasty of the right peroneal artery. 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 At that  time she was also seen by infectious disease was started on IV vancomycin and oral ciprofloxacin and metronidazole. patient says at the present time she is on Flagyl as per Dr. [name] and has not been seeing infectious disease doctors. Her last hemoglobin A1c was 5.9. 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 She also is on hemodialysis Recently seen by Dr. Aldean Baker on 07/22/2016 a right great toe open wound and he had treated her for this with nitroglycerin patch changes daily and dressing for the foot with nonweightbearing and has asked her to return in 2 weeks. he had also put her on central dressings and doxycycline was prescribed for 2 weeks past surgical history is significant for amputation of the right great toe at the MTP joint on 06/25/2016 by Dr. [name]. Also status AV fistula placement, cesarean section, coronary angiogram, left third and fourth digit reconstruction surgery of her fingers, shoulder arthroscopy and tonsillectomy. She has never been a smoker. [name], [name] ([id number]) her MRI done on 06/22/2016 showed -- IMPRESSION: Ulceration on the distal great toe. Mild marrow edema in the tuft of the distal phalanx is  worrisome for osteomyelitis. Negative for abscess or septic joint. as per Dr. Audrie Lia notes she had an amputation of the right great toe and partial amputation of the first metatarsal including the sesamoids as she had a diagnosis of gangrene, osteomyelitis and had failed conservative measures and elected for surgical management. Arterial duplex studies done on 06/21/2016 showed bilateral noncompressible arteries with the right pedal artery waveform appears abnormal with monophasic flow and the left lower extremity flow was triphasic. hemoglobin A1c on September 22 was 5.1% O St. recently was 4.6% a week ago. 08/08/2016 -- the patient has seen Dr. [name] who did not recommend any further surgical intervention and asked her to continue with local care and see him back in 2 weeks. She has not seen the infectious disease doctor. She has not got her x-rays done. The patient continues to be his most nonchalant about her care and she does not seem to be very compliant. ======= Electronic Signature(s) Signed: 12/29/2016 2:06:11 PM By: Evlyn Kanner MD, FACS Previous Signature: 12/29/2016 1:34:51 PM Version By: Evlyn Kanner MD, FACS Entered By: Evlyn Kanner on 12/29/2016 14:06:11 [name] ([id number]) -------------------------------------------------------------------------------- Physical Exam Details Patient Name: [name] Date of Service: 12/29/2016 12:45 PM Medical Record Number: [id number] Patient Account Number: [id number] Date of Birth/Sex: 1981-08-27 (35 y.o. Female) Treating RN: Huel Coventry Primary Care Provider: [name] Other Clinician: Referring Provider: [name] Treating Provider/Extender: Rudene Re in Treatment: 0 Constitutional . Pulse regular. Respirations normal and unlabored. Afebrile. . Eyes Nonicteric. Reactive to light. Ears, Nose, Mouth, and Throat Lips, teeth, and gums WNL.Marland Kitchen Moist mucosa without lesions. Neck supple and nontender. No palpable  supraclavicular or cervical adenopathy. Normal sized without goiter. Respiratory WNL. No retractions.. Breath sounds WNL, No rubs, rales, rhonchi, or wheeze.. Cardiovascular Pedal Pulses WNL. she had Doppler signals on her left ankle pulses but could not measure her ABI. No clubbing, cyanosis or edema. Chest Breasts symmetical and no nipple discharge.. Breast tissue WNL, no masses, lumps, or tenderness.. Gastrointestinal (GI) Abdomen without masses or tenderness.. No liver or spleen enlargement or tenderness.. Lymphatic No adneopathy. No adenopathy. No adenopathy. Musculoskeletal Adexa without tenderness or enlargement.Marland Kitchen  Digits and nails w/o clubbing, cyanosis, infection, petechiae, ischemia, or inflammatory conditions.. Integumentary (Hair, Skin) No suspicious lesions. No crepitus or fluctuance. No peri-wound warmth or erythema. No masses.Marland Kitchen Psychiatric Judgement and insight Intact.. No evidence of depression, anxiety, or agitation.. Notes a left transmetatarsal amputation site both medially and laterally has thick eschar with necrotic debris at the base and this is extremely tender medially. On the left lateral calcaneal area she has got a pressure injury which is unstageable and this does not show any surrounding cellulitis or evidence of inflammation. Electronic Signature(s) Signed: 12/29/2016 2:07:33 PM By: Evlyn Kanner MD, FACS Entered By: Evlyn Kanner on 12/29/2016 14:07:32 [name] ([id number]) [name], [name] ([id number]) -------------------------------------------------------------------------------- Physician Orders Details Patient Name: [name] Date of Service: 12/29/2016 12:45 PM Medical Record Number: [id number] Patient Account Number: [id number] Date of Birth/Sex: October 20, 1980 (35 y.o. Female) Treating RN: Huel Coventry Primary Care Provider: [name] Other Clinician: Referring Provider: [name] Treating Provider/Extender: Rudene Re in Treatment:  0 Verbal / Phone Orders: No Diagnosis Coding Wound Cleansing Wound #4 Left Amputation Site - Transmetatarsal o Cleanse wound with mild soap and water Wound #5 Left Malleolus o Cleanse wound with mild soap and water Wound #6 Left,Lateral Amputation Site - Transmetatarsal o Cleanse wound with mild soap and water Anesthetic Wound #4 Left Amputation Site - Transmetatarsal o Topical Lidocaine 4% cream applied to wound bed prior to debridement Wound #6 Left,Lateral Amputation Site - Transmetatarsal o Topical Lidocaine 4% cream applied to wound bed prior to debridement Primary Wound Dressing Wound #4 Left Amputation Site - Transmetatarsal o Santyl Ointment Wound #6 Left,Lateral Amputation Site - Transmetatarsal o Santyl Ointment Wound #5 Left Malleolus o Other: - Betadine Secondary Dressing Wound #4 Left Amputation Site - Transmetatarsal o Gauze, ABD and Kerlix/Conform Wound #5 Left Malleolus o Gauze, ABD and Kerlix/Conform Wound #6 Left,Lateral Amputation Site - Transmetatarsal o Gauze, ABD and Kerlix/Conform [name], [name] ([id number]) Dressing Change Frequency Wound #4 Left Amputation Site - Transmetatarsal o Change dressing every day. Wound #5 Left Malleolus o Change dressing every day. Wound #6 Left,Lateral Amputation Site - Transmetatarsal o Change dressing every day. Follow-up Appointments Wound #4 Left Amputation Site - Transmetatarsal o Return Appointment in 1 week. Wound #5 Left Malleolus o Return Appointment in 1 week. Wound #6 Left,Lateral Amputation Site - Transmetatarsal o Return Appointment in 1 week. Off-Loading Wound #5 Left Malleolus o Other: - Keep pressure off heel Home Health Wound #4 Left Amputation Site - Transmetatarsal o Initiate Home Health for Skilled Nursing o Home Health Nurse may visit PRN to address patientos wound care needs. o FACE TO FACE ENCOUNTER: MEDICARE and MEDICAID PATIENTS: I certify that this  patient is under my care and that I had a face-to-face encounter that meets the physician face-to-face encounter requirements with this patient on this date. The encounter with the patient was in whole or in part for the following MEDICAL CONDITION: (primary reason for Home Healthcare) MEDICAL NECESSITY: I certify, that based on my findings, NURSING services are a medically necessary home health service. HOME BOUND STATUS: I certify that my clinical findings support that this patient is homebound (i.e., Due to illness or injury, pt requires aid of supportive devices such as crutches, cane, wheelchairs, walkers, the use of special transportation or the assistance of another person to leave their place of residence. There is a normal inability to leave the home and doing so requires considerable and taxing effort. Other absences are for medical reasons / religious services and are  infrequent or of short duration when for other reasons). o If current dressing causes regression in wound condition, may D/C ordered dressing product/s and apply Normal Saline Moist Dressing daily until next Wound Healing Center / Other MD appointment. Notify Wound Healing Center of regression in wound condition at [phone]. o Please direct any NON-WOUND related issues/requests for orders to patient's Primary Care Physician Wound #5 Left Malleolus o [facility] [place], [facility]ECKSEL ([id number]) o Home Health Nurse may visit PRN to address patientos wound care needs. o FACE TO FACE ENCOUNTER: MEDICARE and MEDICAID PATIENTS: I certify that this patient is under my care and that I had a face-to-face encounter that meets the physician face-to-face encounter requirements with this patient on this date. The encounter with the patient was in whole or in part for the following MEDICAL CONDITION: (primary reason for Home Healthcare) MEDICAL NECESSITY: I certify, that based on my findings,  NURSING services are a medically necessary home health service. HOME BOUND STATUS: I certify that my clinical findings support that this patient is homebound (i.e., Due to illness or injury, pt requires aid of supportive devices such as crutches, cane, wheelchairs, walkers, the use of special transportation or the assistance of another person to leave their place of residence. There is a normal inability to leave the home and doing so requires considerable and taxing effort. Other absences are for medical reasons / religious services and are infrequent or of short duration when for other reasons). o If current dressing causes regression in wound condition, may D/C ordered dressing product/s and apply Normal Saline Moist Dressing daily until next Wound Healing Center / Other MD appointment. Notify Wound Healing Center of regression in wound condition at [phone]. o Please direct any NON-WOUND related issues/requests for orders to patient's Primary Care Physician Wound #6 Left,Lateral Amputation Site - Transmetatarsal o Initiate Home Health for Skilled Nursing o Home Health Nurse may visit PRN to address patientos wound care needs. o FACE TO FACE ENCOUNTER: MEDICARE and MEDICAID PATIENTS: I certify that this patient is under my care and that I had a face-to-face encounter that meets the physician face-to-face encounter requirements with this patient on this date. The encounter with the patient was in whole or in part for the following MEDICAL CONDITION: (primary reason for Home Healthcare) MEDICAL NECESSITY: I certify, that based on my findings, NURSING services are a medically necessary home health service. HOME BOUND STATUS: I certify that my clinical findings support that this patient is homebound (i.e., Due to illness or injury, pt requires aid of supportive devices such as crutches, cane, wheelchairs, walkers, the use of special transportation or the assistance of another  person to leave their place of residence. There is a normal inability to leave the home and doing so requires considerable and taxing effort. Other absences are for medical reasons / religious services and are infrequent or of short duration when for other reasons). o If current dressing causes regression in wound condition, may D/C ordered dressing product/s and apply Normal Saline Moist Dressing daily until next Wound Healing Center / Other MD appointment. Notify Wound Healing Center of regression in wound condition at [phone]. o Please direct any NON-WOUND related issues/requests for orders to patient's Primary Care Physician Electronic Signature(s) Signed: 12/29/2016 4:17:13 PM By: Evlyn Kanner MD, FACS Signed: 12/30/2016 1:43:54 PM By: Elliot Gurney RN, BSN, Kim RN, BSN Entered By: Elliot Gurney, RN, BSN, Kim on 12/29/2016 13:48:12 [name] ([id number]) -------------------------------------------------------------------------------- Problem List Details Patient Name: [name]  Date of Service: 12/29/2016 12:45 PM Medical Record Number: [id number] Patient Account Number: [id number] Date of Birth/Sex: [birth year] (35 y.o. Female) Treating RN: Huel Coventry Primary Care Provider: [name] Other Clinician: Referring Provider: [name] Treating Provider/Extender: Rudene Re in Treatment: 0 Active Problems ICD-10 Encounter Code Description Active Date Diagnosis E10.621 Type 1 diabetes mellitus with foot ulcer 12/29/2016 Yes I70.245 Atherosclerosis of native arteries of left leg with ulceration 12/29/2016 Yes of other part of foot L97.522 Non-pressure chronic ulcer of other part of left foot with fat 12/29/2016 Yes layer exposed T87.44 Infection of amputation stump, left lower extremity 12/29/2016 Yes Z99.2 Dependence on renal dialysis 12/29/2016 Yes T81.31XA Disruption of external operation (surgical) wound, not 12/29/2016 Yes elsewhere classified, initial encounter L89.620  Pressure ulcer of left heel, unstageable 12/29/2016 Yes Inactive Problems Resolved Problems Electronic Signature(s) Signed: 12/29/2016 1:58:20 PM By: Evlyn Kanner MD, FACS Previous Signature: 12/29/2016 1:57:11 PM Version By: Evlyn Kanner MD, FACS Entered By: Evlyn Kanner on 12/29/2016 13:58:20 [name] ([id number] [name] ([id number]) -------------------------------------------------------------------------------- Progress Note Details Patient Name: [name] Date of Service: 12/29/2016 12:45 PM Medical Record Number: [id number] Patient Account Number: [id number] Date of Birth/Sex: 09/30/1980 (35 y.o. Female) Treating RN: Huel Coventry Primary Care Provider: [name] Other Clinician: Referring Provider: [name] Treating Provider/Extender: Rudene Re in Treatment: 0 Subjective Chief Complaint Information obtained from Patient Patients presents for treatment of an open diabetic ulcer to the left transmetatarsal amputation site and this has been there since 10/08/2016 and has a left lateral heel decubitus ulcer since November 2017 History of Present Illness (HPI) The following HPI elements were documented for the patient's wound: Location: open wound right medial foot where recent amputation was done and skin ulcers on the left third and fourth toe Quality: Patient reports experiencing a sharp pain to affected area(s). Severity: Patient states wound are getting worse. Duration: Patient has had the wound for < 2 weeks prior to presenting for treatment Timing: Pain in wound is constant (hurts all the time) Context: The wound occurred when the patient had gangrene of the right first toe and was admitted to the hospital Modifying Factors: Other treatment(s) tried include:doxycycline and Santyl ointment Associated Signs and Symptoms: Patient reports having foul odor. 36 year old patient who was seen by me on 2 different occasions in October/November 2017, has  now come back to the wound center. 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 She also has a ulcer on the left heel which has been there since November 2017. Review of her electronic medical records revealed that she had a angiogram done by Dr. Fabienne Bruns on 09/05/2016 --he did an abdominal aortogram with bilateral lower extremity runoffs, angioplasty of the right anterior tibial artery, angioplasty of the right peroneal artery. 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 At that time she was also seen by infectious disease was started on IV vancomycin and oral ciprofloxacin and metronidazole. patient says at the present time she is on Flagyl as per Dr. [name] and has not been seeing infectious disease doctors. Her last hemoglobin A1c was 5.9. 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 ZAIDA, REILAND ([id number]) She also is on hemodialysis Recently seen by Dr. Aldean Baker on 07/22/2016 a right great toe open wound and he had treated her for this with nitroglycerin patch changes daily and dressing for the foot with nonweightbearing and has asked her to return in 2 weeks. he had also put her on central dressings and doxycycline was prescribed for 2 weeks past surgical history is significant for amputation of the right great toe at the MTP joint on 06/25/2016 by Dr. [name]. Also status AV  fistula placement, cesarean section, coronary angiogram, left third and fourth digit reconstruction surgery of her fingers, shoulder arthroscopy and tonsillectomy. She has never been a smoker. her MRI done on 06/22/2016 showed -- IMPRESSION: Ulceration on the distal great toe. Mild marrow edema in the tuft of the distal phalanx is worrisome for osteomyelitis. Negative for abscess or septic joint. as per Dr. Audrie Lia notes she had an amputation of the right great toe and partial amputation of the first metatarsal including the sesamoids as she had a diagnosis of gangrene, osteomyelitis and had failed conservative measures and elected for surgical management. Arterial duplex studies done on 06/21/2016 showed bilateral noncompressible arteries with the right pedal artery waveform appears abnormal with monophasic flow and the left lower extremity flow was triphasic. hemoglobin A1c on September 22 was 5.1% O St. recently was 4.6% a week ago. 08/08/2016 -- the patient has seen Dr. [name] who did not recommend any further surgical intervention and asked her to continue with local care and see him back in 2 weeks. She has not seen the infectious disease doctor. She has not got her x-rays done. The patient continues to be his most nonchalant about her care and she does not seem to be very compliant. ======= Wound History Patient presents with 2 open wounds that have been present for approximately 2 month. Patient has been treating wounds in the following manner: silver sulfadine. Laboratory tests have not been performed in the last month. Patient reportedly has not tested positive for an antibiotic resistant organism. Patient reportedly has tested positive for osteomyelitis. Patient reportedly has had testing performed to evaluate circulation in the legs. Patient History Information obtained from Patient. Allergies sulfur, asprin, tramadol Family History Cancer - Father, Diabetes - Mother, Maternal  Grandparents, Hypertension - Father, Lung Disease - Father, Stroke - Mother, Thyroid Problems - Mother, No family history of Heart Disease, Hereditary Spherocytosis, Kidney Disease, Seizures, Tuberculosis. Social History Never smoker, Marital Status - Single, Alcohol Use - Never, Drug Use - No History, Caffeine Use - Never. Medical History Eyes Denies history of Cataracts, Glaucoma, Optic Neuritis [name], [name] ([id number]) Ear/Nose/Mouth/Throat Denies history of Chronic sinus problems/congestion Hematologic/Lymphatic Denies history of Hemophilia, Human Immunodeficiency Virus, Lymphedema, Sickle Cell Disease Respiratory Patient has history of Sleep Apnea Denies history of Aspiration, Pneumothorax, Tuberculosis Cardiovascular Denies history of Angina, Arrhythmia, Coronary Artery Disease, Deep Vein Thrombosis, Hypotension, Peripheral Arterial Disease, Peripheral Venous Disease, Phlebitis, Vasculitis Gastrointestinal Denies history of Cirrhosis , Colitis, Crohn s, Hepatitis A, Hepatitis B, Hepatitis C Endocrine Patient has history of Type I Diabetes Denies history of Type II Diabetes Immunological Denies history of Lupus Erythematosus, Raynaud s, Scleroderma Integumentary (Skin) Denies history of History of Burn, History of pressure wounds Musculoskeletal Patient has history of Osteoarthritis - left hand Denies history of Gout, Rheumatoid Arthritis, Osteomyelitis Neurologic Denies history of Dementia, Neuropathy, Quadriplegia, Paraplegia, Seizure Disorder Oncologic  Denies history of Received Chemotherapy, Received Radiation Psychiatric Denies history of Anorexia/bulimia, Confinement Anxiety Patient is treated with Insulin. Blood sugar is tested. Blood sugar results noted at the following times: Breakfast - 96. Hospitalization/Surgery History - 10/25/2016, [place], Bilateral transmet amputation. Medical And Surgical History Notes Constitutional Symptoms (General Health) Type II  DM; Dialysis Tues, Thurs, Sat; HTN, High Cholesterol; ESRD; asthma Review of Systems (ROS) Constitutional Symptoms (General Health) The patient has no complaints or symptoms. Eyes The patient has no complaints or symptoms. Ear/Nose/Mouth/Throat The patient has no complaints or symptoms. Hematologic/Lymphatic The patient has no complaints or symptoms. Respiratory The patient has no complaints or symptoms. Cardiovascular The patient has no complaints or symptoms. [name], [name] ([id number]) Gastrointestinal The patient has no complaints or symptoms. Endocrine Denies complaints or symptoms of Hepatitis, Thyroid disease, Polydypsia (Excessive Thirst). Genitourinary The patient has no complaints or symptoms. Immunological The patient has no complaints or symptoms. Integumentary (Skin) Complains or has symptoms of Wounds, Bleeding or bruising tendency. Denies complaints or symptoms of Breakdown, Swelling. Musculoskeletal The patient has no complaints or symptoms. Neurologic The patient has no complaints or symptoms. Oncologic The patient has no complaints or symptoms. Psychiatric The patient has no complaints or symptoms. Medications carvedilol 25 mg tablet oral tablet oral metronidazole 500 mg tablet oral 1 1 tablet oral three times dily oxycodone 5 mg tablet,oral ONLY (not feeding tubes) oral tablet, oral only oral Lyrica 50 mg capsule oral capsule oral diphenhydramine 25 mg tablet oral tablet oral hydroxyzine HCl 25 mg tablet oral tablet oral atorvastatin 40 mg tablet oral tablet oral albuterol sulfate 2.5 mg/3 mL (0.083 %) solution for nebulization inhalation solution for nebulization inhalation albuterol sulfate HFA 90 mcg/actuation aerosol inhaler inhalation HFA aerosol inhaler inhalation sevelamer carbonate 800 mg tablet oral tablet oral budesonide 0.25 mg/2 mL suspension for nebulization inhalation suspension for nebulization inhalation Novolog Flexpen U-100 Insulin aspart  100 unit/mL subcutaneous subcutaneous insulin pen subcutaneous oxycodone-acetaminophen 5 mg-325 mg tablet oral tablet oral clopidogrel 75 mg tablet oral tablet oral duloxetine 30 mg capsule,delayed release oral capsule,delayed release(DR/EC) oral methocarbamol 500 mg tablet oral tablet oral Santyl 250 unit/gram topical ointment topical ointment topical isosorbide dinitrate 20 mg tablet oral tablet oral Objective [name], [name] ([id number]) Constitutional Pulse regular. Respirations normal and unlabored. Afebrile. Vitals Time Taken: 12:55 PM, Height: 68 in, Weight: 240 lbs, BMI: 36.5, Temperature: 98.7 F, Pulse: 68 bpm, Respiratory Rate: 16 breaths/min, Blood Pressure: 110/88 mmHg. Eyes Nonicteric. Reactive to light. Ears, Nose, Mouth, and Throat Lips, teeth, and gums WNL.Marland Kitchen Moist mucosa without lesions. Neck supple and nontender. No palpable supraclavicular or cervical adenopathy. Normal sized without goiter. Respiratory WNL. No retractions.. Breath sounds WNL, No rubs, rales, rhonchi, or wheeze.. Cardiovascular Pedal Pulses WNL. she had Doppler signals on her left ankle pulses but could not measure her ABI. No clubbing, cyanosis or edema. Chest Breasts symmetical and no nipple discharge.. Breast tissue WNL, no masses, lumps, or tenderness.. Gastrointestinal (GI) Abdomen without masses or tenderness.. No liver or spleen enlargement or tenderness.. Lymphatic No adneopathy. No adenopathy. No adenopathy. Musculoskeletal Adexa without tenderness or enlargement.. Digits and nails w/o clubbing, cyanosis, infection, petechiae, ischemia, or inflammatory conditions.Marland Kitchen Psychiatric Judgement and insight Intact.. No evidence of depression, anxiety, or agitation.. General Notes: a left transmetatarsal amputation site both medially and laterally has thick eschar with necrotic debris at the base and this is extremely tender medially. On the left lateral calcaneal area she has got a pressure injury  which is unstageable and this does not  show any surrounding cellulitis or evidence of inflammation. Integumentary (Hair, Skin) No suspicious lesions. No crepitus or fluctuance. No peri-wound warmth or erythema. No masses.. Wound #4 status is Open. Original cause of wound was Surgical Injury. The wound is located on the Left Amputation Site - Transmetatarsal. The wound measures 1.5cm length x 2.4cm width x 0.5cm depth; Shipes, Kayda ([id number]) 2.827cm^2 area and 1.414cm^3 volume. There is Fat Layer (Subcutaneous Tissue) Exposed exposed. There is no tunneling or undermining noted. There is a medium amount of serous drainage noted. The wound margin is flat and intact. There is no granulation within the wound bed. There is a large (67-100%) amount of necrotic tissue within the wound bed including Adherent Slough. The periwound skin appearance exhibited: Maceration, Erythema. The periwound skin appearance did not exhibit: Callus, Crepitus, Excoriation, Induration, Rash, Scarring, Dry/Scaly, Atrophie Blanche, Cyanosis, Ecchymosis, Hemosiderin Staining, Mottled, Pallor, Rubor. The surrounding wound skin color is noted with erythema. Wound #5 status is Open. Original cause of wound was Gradually Appeared. The wound is located on the Left Malleolus. The wound measures 2.5cm length x 2cm width x 0.1cm depth; 3.927cm^2 area and 0.393cm^3 volume. The wound is limited to skin breakdown. There is no tunneling or undermining noted. There is a none present amount of drainage noted. The wound margin is flat and intact. There is a large (67-100%) amount of necrotic tissue within the wound bed including Eschar. Wound #6 status is Open. Original cause of wound was Surgical Injury. The wound is located on the Left,Lateral Amputation Site - Transmetatarsal. The wound measures 1.5cm length x 2.6cm width x 0.2cm depth; 3.063cm^2 area and 0.613cm^3 volume. There is Fat Layer (Subcutaneous Tissue) Exposed exposed. There  is no tunneling or undermining noted. There is a large amount of serous drainage noted. The wound margin is flat and intact. There is no granulation within the wound bed. There is a large (67- 100%) amount of necrotic tissue within the wound bed including Eschar and Adherent Slough. The periwound skin appearance exhibited: Scarring. The periwound skin appearance did not exhibit: Callus, Crepitus, Excoriation, Induration, Rash, Dry/Scaly, Maceration, Atrophie Blanche, Cyanosis, Ecchymosis, Hemosiderin Staining, Mottled, Pallor, Rubor, Erythema. 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 She is still under the care of the orthopedic surgeon Dr. [name] and I have asked her to see him in follow-up regarding possible revision debridement and application of a wound VAC. After review I have also recommended: ARIYAN, [name] ([id number]) 1. Vascular surgery consult with Dr. Darrick Penna, for possible surgical options to the left lower extremity 2. Continue with Santyl ointment locally to be changed on a daily basis 3. She is already on oral Flagyl-- continue till she see's infectious disease -- if a referral is pending 4. x-ray of left foot 5. After complete optimization if she continues to have problems  with her diabetic foot ulcer she may benefit from hyperbaric  oxygen therapy.I have not yet discussed this with her. 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 There was a Non-Selective Chemical/enzymatic debridement (non-viable tissue was removed) performed by Huel Coventry, RN.Marland Kitchen A time out was not conducted prior to the start of the procedure. The procedure was tolerated well with a pain level of 0 throughout and a pain level of 0 following the procedure. Post Debridement Measurements: 1.5cm length x 2.4cm width x 0.5cm depth; 1.414cm^3 volume. Character of Wound/Ulcer Post Debridement requires further debridement. Severity of Tissue Post Debridement is: Fat layer exposed. Post procedure Diagnosis Wound #4: Same as Pre-Procedure Plan Wound Cleansing: Wound #4 Left Amputation Site - Transmetatarsal: Cleanse wound with mild soap and water Wound #5 Left Malleolus: Cleanse wound with mild soap and water Wound #6 Left,Lateral Amputation Site - Transmetatarsal: Cleanse wound with mild soap and water Anesthetic: Wound #4 Left Amputation Site - Transmetatarsal: Topical Lidocaine 4% cream applied to wound bed prior to debridement Wound #6 Left,Lateral Amputation Site - Transmetatarsal: Topical Lidocaine 4% cream applied to wound bed prior to debridement Primary Wound Dressing: Wound #4 Left Amputation Site - Transmetatarsal: Santyl Ointment Wound #6 Left,Lateral Amputation Site - Transmetatarsal: Santyl Ointment Wound #5 Left Malleolus: Other: - Betadine Secondary Dressing: Wound #4 Left Amputation Site - Transmetatarsal: Gauze, ABD and Kerlix/Conform Wound #5 Left MalleolusARWA, [name] ([id number]) Gauze, ABD and Kerlix/Conform Wound #6 Left,Lateral Amputation Site - Transmetatarsal: Gauze, ABD and Kerlix/Conform Dressing Change Frequency: Wound #4 Left Amputation Site - Transmetatarsal: Change dressing every  day. Wound #5 Left Malleolus: Change dressing every day. Wound #6 Left,Lateral Amputation Site - Transmetatarsal: Change dressing every day. Follow-up Appointments: Wound #4 Left Amputation Site - Transmetatarsal: Return Appointment in 1 week. Wound #5 Left Malleolus: Return Appointment in 1 week. Wound #6 Left,Lateral Amputation Site - Transmetatarsal: Return Appointment in 1 week. Off-Loading: Wound #5 Left Malleolus: Other: - Keep pressure off heel Home Health: Wound #4 Left Amputation Site - Transmetatarsal: Initiate Home Health for Skilled Nursing Home Health Nurse may visit PRN to address patient s wound care needs. FACE TO FACE ENCOUNTER: MEDICARE and MEDICAID PATIENTS: I certify that this patient is under my care and that I had a face-to-face encounter that meets the physician face-to-face encounter requirements with this patient on this date. The encounter with the patient was in whole or in part for the following MEDICAL CONDITION: (primary reason for Home Healthcare) MEDICAL NECESSITY: I certify, that based on my findings, NURSING services are a medically necessary home health service. HOME BOUND STATUS: I certify that my clinical findings support that this patient is homebound (i.e., Due to illness or injury, pt requires aid of supportive devices such as crutches, cane, wheelchairs, walkers, the use of special transportation or the assistance of another person to leave their place of residence. There is a normal inability to leave the home and doing so requires considerable and taxing effort. Other absences are for medical reasons / religious services and are infrequent or of short duration when for other reasons). If current dressing causes regression in wound condition, may D/C ordered dressing product/s and apply Normal Saline Moist Dressing daily until next Wound Healing Center / Other MD appointment. Notify Wound Healing Center of regression in wound condition at  [phone]. Please direct any NON-WOUND related issues/requests for orders to patient's Primary Care Physician Wound #5 Left Malleolus: Initiate Home Health for Skilled Nursing Home Health Nurse may visit PRN to address patient  s wound care needs. FACE TO FACE ENCOUNTER: MEDICARE and MEDICAID PATIENTS: I certify that this patient is under my care and that I had a face-to-face encounter that meets the physician face-to-face encounter requirements with this patient on this date. The encounter with the patient was in whole or in part for the following MEDICAL CONDITION: (primary reason for Home Healthcare) MEDICAL NECESSITY: I certify, that based on my findings, NURSING services are a medically necessary home health service. HOME BOUND STATUS: I certify that my clinical findings support that this patient is homebound (i.e., Due to illness or injury, pt requires aid of supportive devices such as crutches, cane, wheelchairs, walkers, the use of special transportation or the assistance of another person to leave their place of residence. There is a normal inability to leave the home and doing so requires considerable and taxing effort. Other absences are for medical reasons / religious services and are infrequent or of short duration when for other reasons). [name], [name] ([id number]) If current dressing causes regression in wound condition, may D/C ordered dressing product/s and apply Normal Saline Moist Dressing daily until next Wound Healing Center / Other MD appointment. Notify Wound Healing Center of regression in wound condition at [phone]. Please direct any NON-WOUND related issues/requests for orders to patient's Primary Care Physician Wound #6 Left,Lateral Amputation Site - Transmetatarsal: Initiate Home Health for Skilled Nursing Home Health Nurse may visit PRN to address patient s wound care needs. FACE TO FACE ENCOUNTER: MEDICARE and MEDICAID PATIENTS: I certify that this patient is  under my care and that I had a face-to-face encounter that meets the physician face-to-face encounter requirements with this patient on this date. The encounter with the patient was in whole or in part for the following MEDICAL CONDITION: (primary reason for Home Healthcare) MEDICAL NECESSITY: I certify, that based on my findings, NURSING services are a medically necessary home health service. HOME BOUND STATUS: I certify that my clinical findings support that this patient is homebound (i.e., Due to illness or injury, pt requires aid of supportive devices such as crutches, cane, wheelchairs, walkers, the use of special transportation or the assistance of another person to leave their place of residence. There is a normal inability to leave the home and doing so requires considerable and taxing effort. Other absences are for medical reasons / religious services and are infrequent or of short duration when for other reasons). If current dressing causes regression in wound condition, may D/C ordered dressing product/s and apply Normal Saline Moist Dressing daily until next Wound Healing Center / Other MD appointment. Notify Wound Healing Center of regression in wound condition at [phone]. 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 She is still under the care of the orthopedic surgeon Dr. [name] and I have asked her to see him in follow-up regarding possible revision debridement and application of a wound VAC. After review I have also recommended: 1. Vascular surgery consult with Dr. Darrick Penna, for possible surgical options to the left lower extremity 2. Continue with Santyl ointment locally to be changed on  a daily basis 3. She is already on oral Flagyl-- continue till she see's infectious disease -- if a referral is pending 4. x-ray of left foot 5. After complete optimization if she continues  to have problems with her diabetic foot ulcer she may benefit from hyperbaric oxygen therapy.I have not yet discussed this with her. Electronic Signature(s) Signed: 12/31/2016 7:35:26 AM By: Evlyn Kanner MD, FACS Previous Signature: 12/29/2016 2:11:46 PM Version By: Evlyn Kanner MD, FACS Entered By: Evlyn Kanner on 12/31/2016 07:35:26 [name] ([id number]) -------------------------------------------------------------------------------- ROS/PFSH Details Patient Name: [name] Date of Service: 12/29/2016 12:45 PM Medical Record Number: [id number] Patient Account Number: [id number] Date of Birth/Sex: October 25, 1980 (35 y.o. Female) Treating RN: Huel Coventry Primary Care Provider: [name] Other Clinician: Referring Provider: [name] Treating Provider/Extender: Rudene Re in Treatment: 0 Information Obtained From Patient Wound History Do you currently have one or more open woundso Yes How many open wounds do you currently haveo 2 Approximately how long have you had your woundso 2 month How have you been treating your wound(s) until nowo silver sulfadine Has your wound(s) ever healed and then re-openedo No Have you had any lab work done in the past montho No Have you tested positive for an antibiotic resistant organism (MRSA, VRE)o No Have you tested positive for osteomyelitis (bone infection)o Yes Have you had any tests for circulation on your legso Yes Endocrine Complaints and Symptoms: Negative for: Hepatitis; Thyroid disease; Polydypsia (Excessive Thirst) Medical History: Positive for: Type I Diabetes Negative for: Type II Diabetes Time with diabetes: since she was 9 Treated with: Insulin Blood sugar tested every day: Yes Tested : 3x a day with meals Blood sugar  testing results: Breakfast: 96 Integumentary (Skin) Complaints and Symptoms: Positive for: Wounds; Bleeding or bruising tendency Negative for: Breakdown; Swelling Medical History: Negative for: History of Burn; History of pressure wounds Constitutional Symptoms (General Health) Complaints and Symptoms: No Complaints or Symptoms [name], [name] ([id number]) Medical History: Past Medical History Notes: Type II DM; Dialysis Tues, Thurs, Sat; HTN, High Cholesterol; ESRD; asthma Eyes Complaints and Symptoms: No Complaints or Symptoms Medical History: Negative for: Cataracts; Glaucoma; Optic Neuritis Ear/Nose/Mouth/Throat Complaints and Symptoms: No Complaints or Symptoms Medical History: Negative for: Chronic sinus problems/congestion Hematologic/Lymphatic Complaints and Symptoms: No Complaints or Symptoms Medical History: Positive for: Anemia Negative for: Hemophilia; Human Immunodeficiency Virus; Lymphedema; Sickle Cell Disease Respiratory Complaints and Symptoms: No Complaints or Symptoms Medical History: Positive for: Asthma; Chronic Obstructive Pulmonary Disease (COPD); Sleep Apnea Negative for: Aspiration; Pneumothorax; Tuberculosis Cardiovascular Complaints and Symptoms: No Complaints or Symptoms Medical History: Positive for: Congestive Heart Failure; Hypertension; Myocardial Infarction Negative for: Angina; Arrhythmia; Coronary Artery Disease; Deep Vein Thrombosis; Hypotension; Peripheral Arterial Disease; Peripheral Venous Disease; Phlebitis; Vasculitis Gastrointestinal Complaints and Symptoms: No Complaints or Symptoms [name], [name] ([id number]) Medical History: Negative for: Cirrhosis ; Colitis; Crohnos; Hepatitis A; Hepatitis B; Hepatitis C Genitourinary Complaints and Symptoms: No Complaints or Symptoms Medical History: Positive for: End Stage Renal Disease Immunological Complaints and Symptoms: No Complaints or Symptoms Medical History: Negative for: Lupus  Erythematosus; Raynaudos; Scleroderma Musculoskeletal Complaints and Symptoms: No Complaints or Symptoms Medical History: Positive for: Osteoarthritis - left hand Negative for: Gout; Rheumatoid Arthritis; Osteomyelitis Neurologic Complaints and Symptoms: No Complaints or Symptoms Medical History: Negative for: Dementia; Neuropathy; Quadriplegia; Paraplegia; Seizure Disorder Oncologic Complaints and Symptoms: No Complaints or Symptoms Medical History: Negative for: Received Chemotherapy; Received Radiation Psychiatric Complaints and Symptoms: No Complaints or Symptoms Medical HistoryKLOEY, [name] ([id number]) Negative for: Anorexia/bulimia; Confinement Anxiety Immunizations Pneumococcal Vaccine: Received Pneumococcal Vaccination: No Immunization Notes: pt states that she does not get the PNA or flu shots per pt Hospitalization / Surgery History Name of Hospital Purpose of Hospitalization/Surgery Date [facility] Bilateral transmet amputation 10/25/2016 Family and Social History Cancer: Yes - Father; Diabetes: Yes - Mother, Maternal Grandparents; Heart Disease: No; Hereditary Spherocytosis: No; Hypertension: Yes - Father; Kidney Disease: No; Lung Disease: Yes - Father; Seizures: No; Stroke: Yes - Mother; Thyroid Problems: Yes - Mother; Tuberculosis: No; Never smoker; Marital Status - Single; Alcohol Use: Never; Drug Use: No History; Caffeine Use: Never; Financial Concerns: No; Food, Clothing or Shelter Needs: No; Support System Lacking: No; Transportation Concerns: No; Advanced Directives: No; Patient does not want information on Advanced Directives; Do not resuscitate: No; Living Will: No; Medical Power of Attorney: No Physician Affirmation I have reviewed and agree with the above information. Electronic Signature(s) Signed: 12/29/2016 4:17:13 PM By: Evlyn Kanner MD, FACS Signed: 12/30/2016 1:43:54 PM By: Elliot Gurney RN, BSN, Kim RN, BSN Entered By: Evlyn Kanner on 12/29/2016  13:50:58 [name] ([id number]) -------------------------------------------------------------------------------- SuperBill Details Patient Name: [name] Date of Service: 12/29/2016 Medical Record Number: [id number] Patient Account Number: [id number] Date of Birth/Sex: 12-23-1980 (35 y.o. Female) Treating RN: Huel Coventry Primary Care Provider: [name] Other Clinician: Referring Provider: [name] Treating Provider/Extender: Rudene Re in Treatment: 0 Diagnosis Coding ICD-10 Codes Code Description E10.621 Type 1 diabetes mellitus with foot ulcer I70.245 Atherosclerosis of native arteries of left leg with ulceration of other part of foot L97.522 Non-pressure chronic ulcer of other part of left foot with fat layer exposed T87.44 Infection of amputation stump, left lower extremity Z99.2 Dependence on renal dialysis Disruption of external operation (surgical) wound, not elsewhere classified, initial T81.31XA encounter 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, unstageable Facility Procedures CPT4 Code: 40102725 Description: 99213 - WOUND CARE VISIT-LEV 3 EST PT Modifier: Quantity: 1 CPT4 Code: [id number] Description: 74259 - DEBRIDE W/O ANES NON SELECT Modifier: Quantity: 1 Physician Procedures CPT4: Description Modifier Quantity Code [id number] 99214 - WC PHYS LEVEL 4 - EST PT 1 ICD-10 Description Diagnosis E10.621 Type 1 diabetes mellitus with foot ulcer I70.245 Atherosclerosis of native arteries of left leg with ulceration of other part of foot  L97.522 Non-pressure chronic ulcer of other part of left foot with fat layer exposed T81.31XA Disruption of external operation (surgical) wound, not elsewhere classified, initial encounter Electronic Signature(s) Signed: 12/30/2016 1:43:54 PM By: Elliot Gurney RN, BSN, Kim RN, BSN Signed: 12/31/2016 7:34:44 AM By: Evlyn Kanner MD, FACS Previous Signature: 12/29/2016 2:12:06 PM Version By: Evlyn Kanner MD, FACS SHARIFA, BUCHOLZ  ([id number]) Entered By: Elliot Gurney RN, BSN, Kim on 12/29/2016 16:33:55

## 2017-01-05 ENCOUNTER — Ambulatory Visit (INDEPENDENT_AMBULATORY_CARE_PROVIDER_SITE_OTHER): Payer: Medicaid Other | Admitting: Orthopedic Surgery

## 2017-01-05 NOTE — Telephone Encounter (Signed)
[name] from [facility] calling asking if home health forms have been faxed to [facility], after being corrected. CB: [phone]

## 2017-01-06 ENCOUNTER — Telehealth (INDEPENDENT_AMBULATORY_CARE_PROVIDER_SITE_OTHER): Payer: Self-pay | Admitting: Orthopedic Surgery

## 2017-01-06 NOTE — Telephone Encounter (Signed)
[name] with [facility] called advised she received a personal care form from Dr [name]. She advised the form was signed on 11/30/16 before the patient was seen on 12/19/16. [name] advised the form will need to be signed again and faxed back to them. The phone # is [phone]   The fax# is [phone] or [phone]

## 2017-01-06 NOTE — Telephone Encounter (Signed)
This has been done.

## 2017-01-09 ENCOUNTER — Ambulatory Visit: Payer: Medicaid Other | Admitting: Surgery

## 2017-01-12 ENCOUNTER — Other Ambulatory Visit: Payer: Self-pay | Admitting: Vascular Surgery

## 2017-01-12 DIAGNOSIS — I70263 Atherosclerosis of native arteries of extremities with gangrene, bilateral legs: Secondary | ICD-10-CM

## 2017-01-12 NOTE — Telephone Encounter (Signed)
Will have him resign tomorrow and refax.

## 2017-01-14 ENCOUNTER — Encounter (INDEPENDENT_AMBULATORY_CARE_PROVIDER_SITE_OTHER): Payer: Self-pay | Admitting: Family

## 2017-01-14 ENCOUNTER — Ambulatory Visit (INDEPENDENT_AMBULATORY_CARE_PROVIDER_SITE_OTHER): Payer: Medicaid Other | Admitting: Family

## 2017-01-14 ENCOUNTER — Encounter: Payer: Self-pay | Admitting: Vascular Surgery

## 2017-01-14 ENCOUNTER — Telehealth (INDEPENDENT_AMBULATORY_CARE_PROVIDER_SITE_OTHER): Payer: Self-pay | Admitting: Orthopedic Surgery

## 2017-01-14 ENCOUNTER — Ambulatory Visit (INDEPENDENT_AMBULATORY_CARE_PROVIDER_SITE_OTHER): Payer: Medicaid Other | Admitting: Orthopedic Surgery

## 2017-01-14 VITALS — Ht 68.0 in | Wt 243.0 lb

## 2017-01-14 DIAGNOSIS — I70262 Atherosclerosis of native arteries of extremities with gangrene, left leg: Secondary | ICD-10-CM

## 2017-01-14 DIAGNOSIS — Z89432 Acquired absence of left foot: Secondary | ICD-10-CM | POA: Diagnosis not present

## 2017-01-14 NOTE — Telephone Encounter (Signed)
Already faxed and faxed again. And faxed with addended date. And I have called [facility] and they believe Dr. [name] signed on March the 4th. When I advised that it was not the 4th it was 12/23/16 and I am looking at the physical copy in front of me. She states that no it says 11-30-16. Regardless Dr. [name] resigned again on original form and this was refaxed with 12/19/16. If looking at previous messages you can see numerous times this form has been faxed and email and refaxed again for the patient. That this is not from lack of effort our on end. Hopefully [facility] will review new fax and contact back the patient.

## 2017-01-14 NOTE — Telephone Encounter (Signed)
Faxed to [facility] with [facility] with addendum of day.

## 2017-01-14 NOTE — Telephone Encounter (Signed)
Patient called stating [facility] faxed over Home Health papers today for Dr [name] to complete. The number to contact patient is [phone]

## 2017-01-14 NOTE — Progress Notes (Signed)
Office Visit Note   Patient: [name]           Date of Birth: [birth year]           MRN: [id number] Visit Date: 01/14/2017              Requested by: [name], FNP [address] [place], [place] [address] PCP: [name], FNP  Chief Complaint  Patient presents with  . Left Foot - Follow-up    Bilateral transmet amputations   . Right Foot - Follow-up      HPI: The patient is a 36 year old woman who presents today in follow-up for transmetatarsal amputation left foot. The amputation has been very slow to heal there is ulceration medially and laterally. Has recently been using Santyl dressing changes seen little improvement. She's been nonweightbearing in a postop shoe. Is in a wheelchair today. The patient states she has been following a vascular surgery they were unable to revascularize the left lower extremity in December of last year.  Chart review reveals she does have an appointment with them on Friday. Appreciate their input.  Assessment & Plan: Visit Diagnoses:  1. Status post transmetatarsal amputation of left foot (HCC)   2. Atherosclerosis of native artery of left lower extremity with gangrene [facility])     Plan: We'll plan for him irrigation and debridement left transmetatarsal amputation versus revision left transmetatarsal amputation pending results of vascular surgery visit on Friday. Continue with daily Santyl dressing changes. Continue nonweightbearing.  Follow-Up Instructions: Return for post op.   Ortho Exam  Patient is alert, oriented, no adenopathy, well-dressed, normal affect, normal respiratory effort. Him left transmetatarsal amputation with continued ulceration medially and laterally. The medial ulceration is 22 mm x 1 cm this is 5 mm deep. There is necrotic tissue and exudative tissue in the wound bed. There are 2 spots of granulation tissue. This probes 6 mm deep with scant clear drainage. No purulence. The lateral ulcer is 1 cm  in diameter and filled in with exudative tissue. This is 2 mm deep. Does not probe to bone. Surrounding tissue tender. No purulence. No palpable abscess.   Imaging: No results found.  Labs: Lab Results  Component Value Date   HGBA1C 5.2 10/08/2016   HGBA1C 5.1 06/20/2016   HGBA1C 7.4 (H) 03/19/2015   ESRSEDRATE 38 (H) 09/27/2016   ESRSEDRATE 28 (H) 06/23/2016   ESRSEDRATE 28 (H) 06/20/2016   CRP 0.6 06/23/2016   CRP 0.8 06/20/2016   REPTSTATUS 09/26/2016 FINAL 09/21/2016   GRAMSTAIN  09/21/2014    RARE WBC NO SQUAMOUS EPITHELIAL CELLS SEEN FEW GRAM POSITIVE COCCI IN CLUSTERS Performed at [facility]    CULT NO GROWTH 5 DAYS 09/21/2016   LABORGA METHICILLIN RESISTANT STAPHYLOCOCCUS AUREUS 09/21/2014    Orders:  No orders of the defined types were placed in this encounter.  No orders of the defined types were placed in this encounter.    Procedures: No procedures performed  Clinical Data: No additional findings.  ROS: Review of Systems  Constitutional: Negative for chills and fever.  Skin: Positive for wound.    Objective: Vital Signs: Ht 5\' 8"  (1.727 m)   Wt 243 lb (110.2 kg)   BMI 36.95 kg/m   Specialty Comments:  No specialty comments available.  PMFS History: Patient Active Problem List   Diagnosis Date Noted  . Status post transmetatarsal amputation of left foot (HCC) 10/21/2016  . Status post transmetatarsal amputation of right foot (HCC) 10/08/2016  .  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 Diabetic osteomyelitis (HCC)   . 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 Diabetic foot infection (HCC) 06/20/2016  . ESRD (end stage renal disease) on dialysis (HCC) 06/20/2016  . Diabetic foot ulcer (HCC) 06/20/2016  . Pre-operative clearance 10/12/2015  . Normal coronary arteries 10/12/2015  . NSTEMI- type 2, Troponin 11.2 05/11/2015  . Respiratory failure requiring intubation (HCC)  05/10/2015  . History of arthroscopy of right shoulder-05/10/15 05/10/2015  . CKD (chronic kidney disease) stage 5, GFR less than 15 ml/min (HCC) 09/21/2014  . Unspecified asthma(493.90) 10/25/2013  . Acute combined systolic and diastolic heart failure (HCC) 09/21/2013  . Non-ischemic cardiomyopathy - by echo 8/16- EF 35-40% 09/16/2013  . Morbid obesity-  09/16/2013  . 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) 09/15/2013  . Normocytic anemia 09/15/2013  . Lower extremity edema 09/15/2013  . Hyperlipidemia   . Hypertension    Past Medical History:  Diagnosis Date  . Anemia   . Arthritis    "left hand" (09/15/2013)  . Asthma   . Chronic bronchitis (HCC)    "just about q yr" (09/15/2013)  . Chronic kidney disease    "low kidney function" (09/15/2013) ,  T/Th/Sa  . COPD (chronic obstructive pulmonary disease) (HCC)   . Coronary artery disease   . Hyperlipidemia   . Hypertension   . Migraine    "get them alot" (09/15/2013)  . Myocardial infarction [facility]) 04/2015   NSTEMI  . Normal coronary arteries    by cardiac catheterization 09/20/13  . Peripheral vascular disease (HCC)   . Pneumonia    "couple times; have it now" (09/15/2013)  . PONV (postoperative nausea and vomiting)   . Restless legs   . Shortness of breath    "just recently; related to the pneumonia" (09/15/2013)  . Type 1 diabetes mellitus (HCC)    type 2    Family History  Problem Relation Age of Onset  . Diabetes Mother   . Stroke Mother   . Hypertension Father   . Hyperlipidemia Father   . Cancer - Lung Father   . Diabetes Maternal Grandmother   . Cancer Paternal Grandmother     Past Surgical History:  Procedure Laterality Date  . AMPUTATION Right 06/25/2016   Procedure: Amputation Right Great Toe at the Metatarsophalangeal Joint;  Surgeon: Nadara Mustard, MD;  [facility];  Service: Orthopedics;  Laterality: Right;  . AMPUTATION Bilateral 10/08/2016   Procedure: Bilateral  Transmetatarsal Amputation;  Surgeon: Nadara Mustard, MD;  Location: [facility] OR;  Service: Orthopedics;  Laterality: Bilateral;  . AV FISTULA PLACEMENT Left 03/27/2015   Procedure: CREATION RADIAL CEPHALIC ARTERIOVENOUS FISTULA;  Surgeon: Chuck Hint, MD;  Location: [facility] OR;  Service: Vascular;  Laterality: Left;  . AV FISTULA PLACEMENT Left 11/23/2015   Procedure:  LEFT ARM BASILIC VEIN TRANSPOSITION  ;  Surgeon: Chuck Hint, MD;  Location: [facility] OR;  Service: Vascular;  Laterality: Left;  . CESAREAN SECTION  1999; 2006  . CORONARY ANGIOGRAM  09/20/2013   Procedure: CORONARY ANGIOGRAM;  Surgeon: Runell Gess, MD;  Location: [facility] CATH LAB;  Service: Cardiovascular;;  . FINGER SURGERY Left 1985   3rd and 4th digits reconstructed after cut off" (09/15/2013)  . PERIPHERAL VASCULAR CATHETERIZATION N/A 09/05/2016   Procedure: Abdominal Aortogram;  Surgeon: Sherren Kerns, MD;  Location: [facility] INVASIVE CV LAB;  Service: Cardiovascular;  Laterality: N/A;  .  PERIPHERAL VASCULAR CATHETERIZATION Bilateral 09/05/2016   Procedure: Lower Extremity Angiography;  Surgeon: Sherren Kerns, MD;  Location: [facility] INVASIVE CV LAB;  Service: Cardiovascular;  Laterality: Bilateral;  . PERIPHERAL VASCULAR CATHETERIZATION Right 09/05/2016   Procedure: Peripheral Vascular Balloon Angioplasty;  Surgeon: Sherren Kerns, MD;  Location: [facility] INVASIVE CV LAB;  Service: Cardiovascular;  Laterality: Right;  peroneal and AT  . SHOULDER ARTHROSCOPY WITH BICEPSTENOTOMY Right 05/10/2015   Procedure: RIGHT SHOULDER ARTHROSCOPY WITH BICEPS TENOTOMY, DEBRIDEMENT LABRAL TEAR;  Surgeon: Jones Broom, MD;  Location: [facility] OR;  Service: Orthopedics;  Laterality: Right;  Right shoulder arthroscopy biceps tenotomy, debridement labral tear  . TONSILLECTOMY  1997  . TUBAL LIGATION  2006   Social History   Occupational History  . Student    Social History Main Topics  . Smoking status: Never Smoker  . Smokeless tobacco: Never Used    . Alcohol use No  . Drug use: No  . Sexual activity: Not Currently    Partners: Male    Birth control/ protection: Other-see comments     Comment: S/P tubal ligation

## 2017-01-16 ENCOUNTER — Encounter: Payer: Medicaid Other | Admitting: Nurse Practitioner

## 2017-01-16 ENCOUNTER — Ambulatory Visit (HOSPITAL_COMMUNITY)
Admission: RE | Admit: 2017-01-16 | Discharge: 2017-01-16 | Disposition: A | Discharge status: Home / Self Care | Payer: Medicaid Other | Source: Ambulatory Visit | Attending: Vascular Surgery | Admitting: Vascular Surgery

## 2017-01-16 ENCOUNTER — Ambulatory Visit (HOSPITAL_COMMUNITY): Payer: Medicaid Other

## 2017-01-16 DIAGNOSIS — E10621 Type 1 diabetes mellitus with foot ulcer: Secondary | ICD-10-CM | POA: Diagnosis not present

## 2017-01-16 DIAGNOSIS — I70263 Atherosclerosis of native arteries of extremities with gangrene, bilateral legs: Secondary | ICD-10-CM

## 2017-01-18 NOTE — Progress Notes (Signed)
[name], [name] ([id number]) Visit Report for 01/16/2017 Arrival Information Details Patient Name: Beth, [name] Date of Service: 01/16/2017 11:15 AM Medical Record Number: [id number] Patient Account Number: [id number] Date of Birth/Sex: 1981-02-16 (35 y.o. Female) Treating RN: [name], [name] [name]: [name] Other Clinician: Referring [name]: [name] Beth [name]/Extender: [name] in Treatment: 2 Visit Information History Since Last Visit All ordered tests and consults were completed: No Patient Arrived: Wheel Chair Added or deleted any medications: No Arrival Time: 11:04 Any new allergies or adverse reactions: No Accompanied By: family Had a fall or experienced change in No Transfer Assistance: None activities of daily living that may affect Patient Identification Verified: Yes risk of falls: Secondary Verification Process Yes Signs or symptoms of abuse/neglect since last No Completed: visito Patient Has Alerts: Yes Hospitalized since last visit: No Patient Alerts: Patient on Blood Has Dressing in Place as Prescribed: Yes Thinner Pain Present Now: Yes Plavix Electronic Signature(s) Signed: 01/16/2017 4:17:26 PM By: [name] BSN, RN Entered By: [name] on 01/16/2017 11:05:26 [name] ([id number]) -------------------------------------------------------------------------------- Clinic Level of Care Assessment Details Patient Name: [name] Date of Service: 01/16/2017 11:15 AM Medical Record Number: [id number] Patient Account Number: [id number] Date of Birth/Sex: 11/06/1980 (35 y.o. Female) Treating RN: [name], Rita Beth Care Nakyiah Kuck: [name] Other Clinician: Referring [place] Dowd: [name] [name]: [name] in Treatment: 2 Clinic Level of Care Assessment Items TOOL 4 Quantity Score []  - Use when only an EandM is performed on FOLLOW-UP visit 0 ASSESSMENTS -  Nursing Assessment / Reassessment X - Reassessment of Co-morbidities (includes updates in patient status) 1 10 X - Reassessment of Adherence to Treatment Plan 1 5 ASSESSMENTS - Wound and Skin Assessment / Reassessment []  - Simple Wound Assessment / Reassessment - one wound 0 X - Complex Wound Assessment / Reassessment - multiple wounds 3 5 []  - Dermatologic / Skin Assessment (not related to wound area) 0 ASSESSMENTS - Focused Assessment []  - Circumferential Edema Measurements - multi extremities 0 []  - Nutritional Assessment / Counseling / Intervention 0 X - Lower Extremity Assessment (monofilament, tuning fork, pulses) 1 5 []  - Peripheral Arterial Disease Assessment (using hand held doppler) 0 ASSESSMENTS - Ostomy and/or Continence Assessment and Care []  - Incontinence Assessment and Management 0 []  - Ostomy Care Assessment and Management (repouching, etc.) 0 PROCESS - Coordination of Care X - Simple Patient / Family Education for ongoing care 1 15 []  - Complex (extensive) Patient / Family Education for ongoing care 0 []  - Staff obtains Chiropractor, Records, Test Results / Process Orders 0 []  - Staff telephones HHA, Nursing Homes / Clarify orders / etc 0 []  - Routine Transfer to another Facility (non-emergent condition) 0 Beth, [name] ([id number]) []  - Routine Hospital Admission (non-emergent condition) 0 []  - New Admissions / Manufacturing engineer / Ordering NPWT, Apligraf, etc. 0 []  - Emergency Hospital Admission (emergent condition) 0 []  - Simple Discharge Coordination 0 []  - Complex (extensive) Discharge Coordination 0 PROCESS - Special Needs []  - Pediatric / Minor Patient Management 0 []  - Isolation Patient Management 0 []  - Hearing / Language / Visual special needs 0 []  - Assessment of Community assistance (transportation, D/C planning, etc.) 0 []  - Additional assistance / Altered mentation 0 []  - Support Surface(s) Assessment (bed, cushion, seat, etc.) 0 INTERVENTIONS - Wound  Cleansing / Measurement []  - Simple Wound Cleansing - one wound 0 X - Complex Wound Cleansing - multiple wounds 3 5 X - Wound  Imaging (photographs - any number of wounds) 1 5 []  - Wound Tracing (instead of photographs) 0 []  - Simple Wound Measurement - one wound 0 X - Complex Wound Measurement - multiple wounds 3 5 INTERVENTIONS - Wound Dressings X - Small Wound Dressing one or multiple wounds 3 10 []  - Medium Wound Dressing one or multiple wounds 0 []  - Large Wound Dressing one or multiple wounds 0 []  - Application of Medications - topical 0 []  - Application of Medications - injection 0 INTERVENTIONS - Miscellaneous []  - External ear exam 0 [name] ([id number]) []  - Specimen Collection (cultures, biopsies, blood, body fluids, etc.) 0 []  - Specimen(s) / Culture(s) sent or taken to Lab for analysis 0 []  - Patient Transfer (multiple staff / Michiel Sites Lift / Similar devices) 0 []  - Simple Staple / Suture removal (25 or less) 0 []  - Complex Staple / Suture removal (26 or more) 0 []  - Hypo / Hyperglycemic Management (close monitor of Blood Glucose) 0 []  - Ankle / Brachial Index (ABI) - do not check if billed separately 0 X - Vital Signs 1 5 Has the patient been seen at the hospital within the last three years: Yes Total Score: 120 Level Of Care: New/Established - Level 4 Electronic Signature(s) Signed: 01/16/2017 4:17:26 PM By: [name] BSN, RN Entered By: [name] on 01/16/2017 11:22:31 [name] ([id number]) -------------------------------------------------------------------------------- Encounter Discharge Information Details Patient Name: [name] Date of Service: 01/16/2017 11:15 AM Medical Record Number: [id number] Patient Account Number: [id number] Date of Birth/Sex: 11-07-1980 (35 y.o. Female) Treating RN: Clover Mealy, RN, [name], [place] [name] Beth Care Kydan Shanholtzer: [name] Other Clinician: Referring Tyshia Fenter: [name] Beth Noble Cicalese/Extender: [name] in  Treatment: 2 Encounter Discharge Information Items Discharge Pain Level: 0 Discharge Condition: Stable Ambulatory Status: Wheelchair Discharge Destination: Home Transportation: Private Auto Accompanied By: family Schedule Follow-up Appointment: No Medication Reconciliation completed and provided to Patient/Care No Neeraj Housand: Provided on Clinical Summary of Care: 01/16/2017 Form Type Recipient Paper Patient IG Electronic Signature(s) Signed: 01/16/2017 4:12:59 PM By: [name] BSN, RN Previous Signature: 01/16/2017 11:31:25 AM Version By: Gwenlyn Perking Entered By: [name] on 01/16/2017 16:12:59 [name] ([id number]) -------------------------------------------------------------------------------- Lower Extremity Assessment Details Patient Name: [name] Date of Service: 01/16/2017 11:15 AM Medical Record Number: [id number] Patient Account Number: [id number] Date of Birth/Sex: Dec 20, 1980 (35 y.o. Female) Treating RN: [name], [place] [name] Beth Care [place] Valli: [name] Other Clinician: Referring Kamiya Acord: [name] Beth Jibran Crookshanks/Extender: [name] in Treatment: 2 Vascular Assessment Pulses: Dorsalis Pedis Palpable: [Left:Yes] Posterior Tibial Extremity colors, hair growth, and conditions: Extremity Color: [Left:Normal] Hair Growth on Extremity: [Left:No] Temperature of Extremity: [Left:Warm] Capillary Refill: [Left:< 3 seconds] Electronic Signature(s) Signed: 01/16/2017 4:17:26 PM By: [name] BSN, RN Entered By: [name] on 01/16/2017 11:10:00 [name] ([id number]) -------------------------------------------------------------------------------- Multi Wound Chart Details Patient Name: [name] Date of Service: 01/16/2017 11:15 AM Medical Record Number: [id number] Patient Account Number: [id number] Date of Birth/Sex: [birth year] (35 y.o. Female) Treating RN: Clover Mealy, RN, [name], [place] [name] Beth Care [place] Gudiel: [name] Other  Clinician: Referring Keyarra Rendall: [name] Beth Joeangel Jeanpaul/Extender: [name] in Treatment: 2 Vital Signs Height(in): 68 Pulse(bpm): 88 Weight(lbs): 240 Blood Pressure 167/94 (mmHg): Body Mass Index(BMI): 36 Temperature(F): 97.7 Respiratory Rate 17 (breaths/min): Photos: [4:No Photos] [5:No Photos] [6:No Photos] Wound Location: [4:Left Amputation Site - Transmetatarsal] [5:Left Malleolus] [6:Left Amputation Site - Transmetatarsal - Lateral] Wounding Event: [4:Surgical Injury] [5:Gradually Appeared] [6:Surgical Injury] Beth 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] [5:Pressure Ulcer] [6:Dehisced Wound] Comorbid History: [4:Anemia, Asthma,  Chronic Obstructive Pulmonary Disease (COPD), Sleep Apnea, Congestive Heart Failure, Hypertension, Myocardial Infarction, Type I Diabetes, End Stage Renal Disease, Osteoarthritis] [5:Anemia, Asthma, Chronic Anemia,  Asthma, Chronic Obstructive Pulmonary Disease (COPD), Sleep Disease (COPD), Sleep Apnea, Congestive Heart Apnea, Congestive Heart Failure, Hypertension, Failure, Hypertension, Myocardial Infarction, Myocardial Infarction, Type I Diabetes, End Stage Renal  Disease, Osteoarthritis] [6:Obstructive Pulmonary Type I Diabetes, End Stage Renal Disease, Osteoarthritis] Date Acquired: [4:11/25/2016] [5:08/27/2016] [6:12/16/2016] Weeks of Treatment: [4:2] [5:2] [6:2] Wound Status: [4:Open] [5:Open] [6:Open] Measurements L x W x D 1.4x2.4x0.8 [5:2.5x2x0.1] [6:1.4x2x0.2] (cm) Area (cm) : [4:2.639] [5:3.927] [6:2.199] Volume (cm) : [4:2.111] [5:0.393] [6:0.44] % Reduction in Area: [4:6.70%] [5:0.00%] [6:28.20%] % Reduction in Volume: -49.30% [5:0.00%] [6:28.20%] Classification: [4:Full Thickness Without Exposed Support Structures] [5:Unstageable/Unclassified] [6:Full Thickness Without Exposed Support Structures] HBO Classification: [4:Grade 2] [5:Grade 1] [6:Grade 2] Exudate Amount: [4:Medium] [5:None Present] [6:Large] Exudate  Type: [4:Serous] [5:N/A] [6:Serous] Exudate Color: [4:amber] [5:N/A] [6:amber] Wound Margin: Flat and Intact Flat and Intact Flat and Intact Granulation Amount: None Present (0%) N/A None Present (0%) Necrotic Amount: Large (67-100%) N/A Large (67-100%) Necrotic Tissue: Adherent Slough Eschar Eschar, Adherent Slough Exposed Structures: Fat Layer (Subcutaneous Fascia: No Fat Layer (Subcutaneous Tissue) Exposed: Yes Fat Layer (Subcutaneous Tissue) Exposed: Yes Fascia: No Tissue) Exposed: No Fascia: No Tendon: No Tendon: No Tendon: No Muscle: No Muscle: No Muscle: No Joint: No Joint: No Joint: No Bone: No Bone: No Bone: No Limited to Skin Breakdown Epithelialization: None None None Periwound Skin Texture: Excoriation: No No Abnormalities Noted Scarring: Yes Induration: No Excoriation: No Callus: No Induration: No Crepitus: No Callus: No Rash: No Crepitus: No Scarring: No Rash: No Periwound Skin Maceration: Yes Dry/Scaly: Yes Maceration: No Moisture: Dry/Scaly: No Dry/Scaly: No Periwound Skin Color: Erythema: Yes No Abnormalities Noted Atrophie Blanche: No Atrophie Blanche: No Cyanosis: No Cyanosis: No Ecchymosis: No Ecchymosis: No Erythema: No Hemosiderin Staining: No Hemosiderin Staining: No Mottled: No Mottled: No Pallor: No Pallor: No Rubor: No Rubor: No Temperature: N/A No Abnormality No Abnormality Tenderness on Yes Yes Yes Palpation: Wound Preparation: Ulcer Cleansing: Ulcer Cleansing: Ulcer Cleansing: Rinsed/Irrigated with Rinsed/Irrigated with Rinsed/Irrigated with Saline Saline Saline Topical Anesthetic Topical Anesthetic Topical Anesthetic Applied: Other: llidocaine Applied: None Applied: Other: lidocaine 4% 4% Treatment Notes Electronic Signature(s) Signed: 01/16/2017 12:08:27 PM By: Bonnell Public Entered By: Bonnell Public on 01/16/2017 12:08:20 [name]  ([id number]) -------------------------------------------------------------------------------- Multi-Disciplinary Care Plan Details Patient Name: [name] Date of Service: 01/16/2017 11:15 AM Medical Record Number: [id number] Patient Account Number: [id number] Date of Birth/Sex: 06/14/1981 (35 y.o. Female) Treating RN: Clover Mealy, RN, [name], [place] [name] Beth Care Mikaila Grunert: [name] Other Clinician: Referring Katianna Mcclenney: [name] Beth Corban Kistler/Extender: [name] in Treatment: 2 Active Inactive ` Necrotic Tissue Nursing Diagnoses: Impaired tissue integrity related to necrotic/devitalized tissue Goals: Necrotic/devitalized tissue will be minimized in the wound bed Date Initiated: 12/29/2016 Target Resolution Date: 03/30/2017 Goal Status: Active Interventions: Assess patient pain level pre-, during and post procedure and prior to discharge Treatment Activities: Apply topical anesthetic as ordered : 12/29/2016 Enzymatic debridement : 12/29/2016 Notes: ` Orientation to the Wound Care Program Nursing Diagnoses: Knowledge deficit related to the wound healing center program Goals: Patient/caregiver will verbalize understanding of the Wound Healing Center Program Date Initiated: 12/29/2016 Target Resolution Date: 03/30/2017 Goal Status: Active Interventions: Provide education on orientation to the wound center Notes: ` Soft Tissue Infection DAYANA, DALPORTO ([id number]) Nursing Diagnoses: Impaired tissue integrity Potential for infection: soft tissue Goals: Patient will remain free of wound infection Date Initiated: 12/29/2016 Target  Resolution Date: 03/30/2017 Goal Status: Active Patient's soft tissue infection will resolve Date Initiated: 12/29/2016 Target Resolution Date: 03/30/2017 Goal Status: Active Interventions: Assess signs and symptoms of infection every visit Treatment Activities: Culture and sensitivity : 12/29/2016 Systemic antibiotics : 12/29/2016 Notes: Electronic  Signature(s) Signed: 01/16/2017 4:17:26 PM By: [name] BSN, RN Entered By: [name] on 01/16/2017 11:21:21 [name] ([id number]) -------------------------------------------------------------------------------- Pain Assessment Details Patient Name: [name] Date of Service: 01/16/2017 11:15 AM Medical Record Number: [id number] Patient Account Number: [id number] Date of Birth/Sex: 08/05/1981 (35 y.o. Female) Treating RN: Clover Mealy, RN, [name], [place] [name] Beth Care Rosalie Buenaventura: [name] Other Clinician: Referring Kamia Insalaco: [name] Beth Axiel Fjeld/Extender: [name] in Treatment: 2 Active Problems Location of Pain Severity and Description of Pain Patient Has Paino Yes Site Locations Pain Location: Pain in Ulcers Pain Management and Medication Current Pain Management: Electronic Signature(s) Signed: 01/16/2017 4:17:26 PM By: [name] BSN, RN Entered By: [name] on 01/16/2017 11:05:42 [name] ([id number]) -------------------------------------------------------------------------------- Patient/Caregiver Education Details Patient Name: [name] Date of Service: 01/16/2017 11:15 AM Medical Record Number: [id number] Patient Account Number: [id number] Date of Birth/Gender: [birth year] (35 y.o. Female) Treating RN: Clover Mealy, RN, [name],  [name] Beth Care Physician: [name] Other Clinician: Referring Physician: Elizabeth [name] Beth Physician/Extender: [name] in Treatment: 2 Education Assessment Education Provided To: Patient Education Topics Provided Welcome To The Wound Care Center: Methods: Explain/Verbal Responses: State content correctly Wound/Skin Impairment: Methods: Explain/Verbal Responses: State content correctly Electronic Signature(s) Signed: 01/16/2017 4:17:26 PM By: [name] BSN, RN Entered By: [name] on 01/16/2017 16:13:17 [name]  ([id number]) -------------------------------------------------------------------------------- Wound Assessment Details Patient Name: [name] Date of Service: 01/16/2017 11:15 AM Medical Record Number: [id number] Patient Account Number: [id number] Date of Birth/Sex: May 10, 1981 (35 y.o. Female) Treating RN: [name], Rita Beth Care Kamryn Gauthier: [name] Other Clinician: Referring Kali Ambler: [name] Beth Tamaira Ciriello/Extender: Bonnell Public Weeks in Treatment: 2 Wound Status Wound Number: 4 Beth Refractory Osteomyelitis Etiology: Wound Location: Left Amputation Site - Transmetatarsal Wound Open Status: Wounding Event: Surgical Injury Comorbid Anemia, Asthma, Chronic Obstructive Date Acquired: 11/25/2016 History: Pulmonary Disease (COPD), Sleep Weeks Of Treatment: 2 Apnea, Congestive Heart Failure, Clustered Wound: No Hypertension, Myocardial Infarction, Type I Diabetes, End Stage Renal Disease, Osteoarthritis Photos Photo Uploaded By: [name] on 01/16/2017 13:30:24 Wound Measurements Length: (cm) 1.4 Width: (cm) 2.4 Depth: (cm) 0.8 Area: (cm) 2.639 Volume: (cm) 2.111 % Reduction in Area: 6.7% % Reduction in Volume: -49.3% Epithelialization: None Tunneling: No Undermining: No Wound Description ClassificationTAILA, BASINSKI ([id number]) Foul Odor After Cleansing: No Full Thickness Without Slough/Fibrino Yes Exposed Support Structures Diabetic Severity Grade 2 (Wagner): Wound Margin: Flat and Intact Exudate Amount: Medium Exudate Type: Serous Exudate Color: amber Wound Bed Granulation Amount: None Present (0%) Exposed Structure Necrotic Amount: Large (67-100%) Fascia Exposed: No Necrotic Quality: Adherent Slough Fat Layer (Subcutaneous Tissue) Exposed: Yes Tendon Exposed: No Muscle Exposed: No Joint Exposed: No Bone Exposed: No Periwound Skin Texture Texture Color No Abnormalities Noted: No No Abnormalities Noted: No Callus:  No Atrophie Blanche: No Crepitus: No Cyanosis: No Excoriation: No Ecchymosis: No Induration: No Erythema: Yes Rash: No Hemosiderin Staining: No Scarring: No Mottled: No Pallor: No Moisture Rubor: No No Abnormalities Noted: No Dry / Scaly: No Temperature / Pain Maceration: Yes Tenderness on Palpation: Yes Wound Preparation Ulcer Cleansing: Rinsed/Irrigated with Saline Topical Anesthetic Applied: Other: llidocaine 4%, Treatment Notes Wound #4 (Left Amputation Site - Transmetatarsal) 1. Cleansed with: Clean wound with Normal Saline 2. Anesthetic Topical Lidocaine 4%  cream to wound bed prior to debridement 4. Dressing Applied: Santyl Ointment 5. Secondary Dressing Applied Gauze and Kerlix/Conform MAZAL, SEDENO ([id number]) 7. Secured with Tape Notes heel cup Electronic Signature(s) Signed: 01/16/2017 4:17:26 PM By: [name] BSN, RN Entered By: [name] on 01/16/2017 11:18:49 [name] ([id number]) -------------------------------------------------------------------------------- Wound Assessment Details Patient Name: [name] Date of Service: 01/16/2017 11:15 AM Medical Record Number: [id number] Patient Account Number: [id number] Date of Birth/Sex: 09-21-1981 (35 y.o. Female) Treating RN: [name], Rita Beth Care Ronneisha Jett: [name] Other Clinician: Referring Jizel Cheeks: [name] Beth Maleiyah Releford/Extender: Bonnell Public Weeks in Treatment: 2 Wound Status Wound Number: 5 Beth Pressure Ulcer Etiology: Wound Location: Left Malleolus Wound Open Wounding Event: Gradually Appeared Status: Date Acquired: 08/27/2016 Comorbid Anemia, Asthma, Chronic Obstructive Weeks Of Treatment: 2 History: Pulmonary Disease (COPD), Sleep Clustered Wound: No Apnea, Congestive Heart Failure, Hypertension, Myocardial Infarction, Type I Diabetes, End Stage Renal Disease, Osteoarthritis Photos Photo Uploaded By: [name] on 01/16/2017 13:30:25 Wound  Measurements Length: (cm) 2.5 Width: (cm) 2 Depth: (cm) 0.1 Area: (cm) 3.927 Volume: (cm) 0.393 % Reduction in Area: 0% % Reduction in Volume: 0% Epithelialization: None Tunneling: No Undermining: No Wound Description Classification: Unstageable/Unclassified Foul Odor SHONTAVIA, MOSELEY ([id number]) After Cleansing: No Diabetic Severity [address]): Grade 1 Slough/Fibrino No Wound Margin: Flat and Intact Exudate Amount: None Present Wound Bed Necrotic Amount: Large (67-100%) Exposed Structure Necrotic Quality: Eschar Fascia Exposed: No Fat Layer (Subcutaneous Tissue) Exposed: No Tendon Exposed: No Muscle Exposed: No Joint Exposed: No Bone Exposed: No Limited to Skin Breakdown Periwound Skin Texture Texture Color No Abnormalities Noted: No No Abnormalities Noted: No Moisture Temperature / Pain No Abnormalities Noted: No Temperature: No Abnormality Dry / Scaly: Yes Tenderness on Palpation: Yes Wound Preparation Ulcer Cleansing: Rinsed/Irrigated with Saline Topical Anesthetic Applied: None Treatment Notes Wound #5 (Left Malleolus) 1. Cleansed with: Clean wound with Normal Saline 2. Anesthetic Topical Lidocaine 4% cream to wound bed prior to debridement 4. Dressing Applied: Santyl Ointment 5. Secondary Dressing Applied Gauze and Kerlix/Conform 7. Secured with Tape Notes heel cup Electronic Signature(s) Signed: 01/16/2017 4:17:26 PM By: [name] BSN, RN Entered By: [name] on 01/16/2017 11:19:30 [name] ([id number]) -------------------------------------------------------------------------------- Wound Assessment Details Patient Name: [name] Date of Service: 01/16/2017 11:15 AM Medical Record Number: [id number] Patient Account Number: [id number] Date of Birth/Sex: 05/19/1981 (35 y.o. Female) Treating RN: [name], Rita Beth Care Maralyn Witherell: [name] Other Clinician: Referring Bashar Milam: [name] Beth Becca Bayne/Extender: Bonnell Public Weeks in Treatment: 2 Wound Status Wound Number: 6 Beth Dehisced Wound Etiology: Wound Location: Left Amputation Site - Transmetatarsal - Lateral Wound Open Status: Wounding Event: Surgical Injury Comorbid Anemia, Asthma, Chronic Obstructive Date Acquired: 12/16/2016 History: Pulmonary Disease (COPD), Sleep Weeks Of Treatment: 2 Apnea, Congestive Heart Failure, Clustered Wound: No Hypertension, Myocardial Infarction, Type I Diabetes, End Stage Renal Disease, Osteoarthritis Photos Photo Uploaded By: [name] on 01/16/2017 13:30:50 Wound Measurements Length: (cm) 1.4 Width: (cm) 2 Depth: (cm) 0.2 Area: (cm) 2.199 Volume: (cm) 0.44 % Reduction in Area: 28.2% % Reduction in Volume: 28.2% Epithelialization: None Tunneling: No Undermining: No Wound Description ClassificationKAMEA, ALLINSON ([id number]) Foul Odor After Cleansing: No Full Thickness Without Slough/Fibrino Yes Exposed Support Structures Diabetic Severity Grade 2 (Wagner): Wound Margin: Flat and Intact Exudate Amount: Large Exudate Type: Serous Exudate Color: amber Wound Bed Granulation Amount: None Present (0%) Exposed Structure Necrotic Amount: Large (67-100%) Fascia Exposed: No Necrotic Quality: Eschar, Adherent Slough Fat Layer (Subcutaneous Tissue) Exposed: Yes Tendon Exposed: No  Muscle Exposed: No Joint Exposed: No Bone Exposed: No Periwound Skin Texture Texture Color No Abnormalities Noted: No No Abnormalities Noted: No Callus: No Atrophie Blanche: No Crepitus: No Cyanosis: No Excoriation: No Ecchymosis: No Induration: No Erythema: No Rash: No Hemosiderin Staining: No Scarring: Yes Mottled: No Pallor: No Moisture Rubor: No No Abnormalities Noted: No Dry / Scaly: No Temperature / Pain Maceration: No Temperature: No Abnormality Tenderness on Palpation: Yes Wound Preparation Ulcer Cleansing: Rinsed/Irrigated with Saline Topical Anesthetic Applied: Other: lidocaine  4%, Treatment Notes Wound #6 (Left, Lateral Amputation Site - Transmetatarsal) 1. Cleansed with: Clean wound with Normal Saline 2. Anesthetic Topical Lidocaine 4% cream to wound bed prior to debridement 4. Dressing Applied: Santyl Ointment 5. Secondary Dressing Applied BERENIZE, GATLIN ([id number]) Gauze and Kerlix/Conform 7. Secured with Tape Notes heel cup Electronic Signature(s) Signed: 01/16/2017 4:17:26 PM By: [name] BSN, RN Entered By: [name] on 01/16/2017 11:21:06 [name] ([id number]) -------------------------------------------------------------------------------- Vitals Details Patient Name: [name] Date of Service: 01/16/2017 11:15 AM Medical Record Number: [id number] Patient Account Number: [id number] Date of Birth/Sex: 02/21/1981 (35 y.o. Female) Treating RN: [name], Rita Beth Care Henretta Quist: [name] Other Clinician: Referring Regnia Mathwig: [name] Beth Che Rachal/Extender: [name] in Treatment: 2 Vital Signs Time Taken: 11:08 Temperature (F): 97.7 Height (in): 68 Pulse (bpm): 88 Weight (lbs): 240 Respiratory Rate (breaths/min): 17 Body Mass Index (BMI): 36.5 Blood Pressure (mmHg): 167/94 Reference Range: 80 - 120 mg / dl Electronic Signature(s) Signed: 01/16/2017 4:17:26 PM By: [name] BSN, RN Entered By: [name] on 01/16/2017 11:08:51

## 2017-01-18 NOTE — Progress Notes (Signed)
SATIN, BOAL ([id number]) Visit Report for 01/16/2017 Chief Complaint Document Details Patient Name: [name], [name] 01/16/2017 11:15 Date of Service: AM Medical Record [id number] Number: Patient Account Number: [id number] 1981-09-27 (35 y.o. Treating RN: Clover Mealy, RN, BSN, [place] Sink Date of Birth/Sex: Female) Other Clinician: Primary Care Provider: [name] Treating Jahshua Bonito Referring Provider: [name] Provider/Extender: Tania Ade in Treatment: 2 Information Obtained from: Patient Chief Complaint Patients presents for treatment of an open diabetic ulcer to the left transmetatarsal amputation site and this has been there since 10/08/2016 and has a left lateral heel decubitus ulcer since November 2017 Electronic Signature(s) Signed: 01/16/2017 12:09:15 PM By: Bonnell Public Entered By: Bonnell Public on 01/16/2017 12:09:15 [name] ([id number]) -------------------------------------------------------------------------------- HPI Details Patient Name: Beth, [name] 01/16/2017 11:15 Date of Service: AM Medical Record [id number] Number: Patient Account Number: [id number] 04-14-1981 (35 y.o. Treating RN: Clover Mealy, RN, BSN, [place] [employer] Date of Birth/Sex: Female) Other Clinician: Primary Care Provider: [name] Treating Shaquanda Graves Referring Provider: [name] Provider/Extender: Tania Ade in Treatment: 2 History of Present Illness Location: open wound right medial foot where recent amputation was done and skin ulcers on the left third and fourth toe Quality: Patient reports experiencing a sharp pain to affected area(s). Severity: Patient states wound are getting worse. Duration: Patient has had the wound for < 2 weeks prior to presenting for treatment Timing: Pain in wound is constant (hurts all the time) Context: The wound occurred when the patient had gangrene of the right first toe and was admitted to the hospital Modifying Factors: Other treatment(s) tried  include:doxycycline and Santyl ointment Associated Signs and Symptoms: Patient reports having foul odor. HPI Description: 36 year old patient who was seen by me on 2 different occasions in October/November 2017, has now come back to the wound center. 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 She also has a ulcer on the left heel which has been there since November 2017. Review of her electronic medical records revealed that she had a angiogram done by Dr. Fabienne Bruns on 09/05/2016 --he did an abdominal aortogram with bilateral lower extremity runoffs, angioplasty of the right anterior tibial artery, angioplasty of the right peroneal artery. 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 At that time she was also seen by infectious disease was started on IV vancomycin and oral ciprofloxacin and metronidazole. patient says at the present time she is on Flagyl as per Dr. [name] and has not been seeing infectious disease doctors. Her last hemoglobin A1c was 5.9. 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 She also is on hemodialysis Recently seen by Dr. Aldean Baker on 07/22/2016 a right great toe open wound and he had treated her for this with nitroglycerin patch changes daily and dressing for the foot with nonweightbearing and has asked her to return in 2 weeks. he had also put her on central  dressings and doxycycline was prescribed for 2 weeks past surgical history is significant for amputation of the right great toe at the MTP joint on 06/25/2016 by Dr. [name]. Also status  AV fistula placement, cesarean section, coronary angiogram, left third and fourth digit [place], [name] ([id number]) reconstruction surgery of her fingers, shoulder arthroscopy and tonsillectomy. She has never been a smoker. her MRI done on 06/22/2016 showed -- IMPRESSION: Ulceration on the distal great toe. Mild marrow edema in the tuft of the distal phalanx is worrisome for osteomyelitis. Negative for abscess or septic joint. as per Dr. Audrie Lia notes she had an amputation of the right great toe and partial amputation of the first metatarsal including the sesamoids as she had a diagnosis of gangrene, osteomyelitis and had failed conservative measures and elected for surgical management. Arterial duplex studies done on 06/21/2016 showed bilateral noncompressible arteries with the right pedal artery waveform appears abnormal with monophasic flow and the left lower extremity flow was triphasic. hemoglobin A1c on September 22 was 5.1% O St. recently was 4.6% a week ago. 08/08/2016 -- the patient has seen Dr. [name] who did not recommend any further surgical intervention and asked her to continue with local care and see him back in 2 weeks. She has not seen the infectious disease doctor. She has not got her x-rays done. The patient continues to be his most nonchalant about her care and she does not seem to be very compliant. ======= 01/16/17- she is here for follow-up evaluation of her left TMA site. She had a follow-up appointment with Dr. [name] on 4/18, the plan is for a surgical debridement versus revision. That has not been scheduled. She has an appointment with vascular medicine on 5/2. She has no appointment pending with infectious disease. She is currently on doxycycline for ear infection, not on Flagyl. She continues  to use Santyl to her wounds. She had an A1c obtained in dialysis last week, result 5.2. She had an x-ray of the left foot on 4/2 which revealed no lytic destruction seen to suggest acute osteoarthritis; soft tissue defects are seen distal to first and fifth residual metatarsals which may represent unhealed surgical wounds or ulcerations. Electronic Signature(s) Signed: 01/16/2017 12:51:59 PM By: Bonnell Public Previous Signature: 01/16/2017 12:51:20 PM Version By: Bonnell Public Entered By: Bonnell Public on 01/16/2017 12:51:59 [name] ([id number]) -------------------------------------------------------------------------------- Physical Exam Details Patient Name: RYELEIGH, SANTORE 01/16/2017 11:15 Date of Service: AM Medical Record [id number] Number: Patient Account Number: [id number] 1980/11/29 (35 y.o. Treating RN: Clover Mealy, RN, BSN, [place] Sink Date of Birth/Sex: Female) Other Clinician: Primary Care Provider: [name] Treating Lailoni Baquera Referring Provider: [name] Provider/Extender: Tania Ade in Treatment: 2 Electronic Signature(s) Signed: 01/16/2017 12:52:06 PM By: Bonnell Public Previous Signature: 01/16/2017 12:51:29 PM Version By: Bonnell Public Entered By: Bonnell Public on 01/16/2017 12:52:06 [name] ([id number]) -------------------------------------------------------------------------------- Physician Orders Details Patient Name: SHANNA, UN 01/16/2017 11:15 Date of Service: AM Medical Record [id number] Number: Patient Account Number: [id number] 1981/07/06 (35 y.o. Treating RN: Afful, RN, BSN, [place] Sink Date of Birth/Sex: Female) Other Clinician: Primary Care Provider: [name] Treating Tamsen Reist Referring Provider: [name] Provider/Extender: Tania Ade in Treatment: 2 Verbal / Phone Orders: No Diagnosis Coding Wound Cleansing Wound #4 Left Amputation Site - Transmetatarsal o Cleanse wound with mild soap and water Wound #5 Left Malleolus o Cleanse  wound with mild soap and water Wound #6 Left,Lateral Amputation Site - Transmetatarsal o Cleanse wound with mild soap and water Anesthetic Wound #4 Left Amputation Site - Transmetatarsal o Topical Lidocaine 4% cream applied to wound bed prior to debridement Wound #6 Left,Lateral Amputation Site - Transmetatarsal o Topical Lidocaine 4% cream applied to wound bed prior to debridement Primary Wound Dressing Wound #  4 Left Amputation Site - Transmetatarsal o Santyl Ointment Wound #5 Left Malleolus o Other: - Betadine Wound #6 Left,Lateral Amputation Site - Transmetatarsal o Santyl Ointment Secondary Dressing Wound #4 Left Amputation Site - Transmetatarsal o Gauze, ABD and Kerlix/Conform Wound #5 Left Malleolus o Gauze, ABD and Kerlix/Conform Wound #6 Left,Lateral Amputation Site - Transmetatarsal Wigley, Nataliah ([id number]) o Gauze, ABD and Kerlix/Conform Dressing Change Frequency Wound #4 Left Amputation Site - Transmetatarsal o Change dressing every day. Wound #5 Left Malleolus o Change dressing every day. Wound #6 Left,Lateral Amputation Site - Transmetatarsal o Change dressing every day. Follow-up Appointments Wound #4 Left Amputation Site - Transmetatarsal o Return Appointment in 1 week. Wound #5 Left Malleolus o Return Appointment in 1 week. Wound #6 Left,Lateral Amputation Site - Transmetatarsal o Return Appointment in 1 week. Off-Loading Wound #5 Left Malleolus o Other: - Keep pressure off heel Home Health Wound #4 Left Amputation Site - Transmetatarsal o Initiate Home Health for Skilled Nursing - [place] to see patient on 01/22/17 o Home Health Nurse may visit PRN to address patientos wound care needs. o FACE TO FACE ENCOUNTER: MEDICARE and MEDICAID PATIENTS: I certify that this patient is under my care and that I had a face-to-face encounter that meets the physician face-to-face encounter requirements with this patient on this date.  The encounter with the patient was in whole or in part for the following MEDICAL CONDITION: (primary reason for Home Healthcare) MEDICAL NECESSITY: I certify, that based on my findings, NURSING services are a medically necessary home health service. HOME BOUND STATUS: I certify that my clinical findings support that this patient is homebound (i.e., Due to illness or injury, pt requires aid of supportive devices such as crutches, cane, wheelchairs, walkers, the use of special transportation or the assistance of another person to leave their place of residence. There is a normal inability to leave the home and doing so requires considerable and taxing effort. Other absences are for medical reasons / religious services and are infrequent or of short duration when for other reasons). o If current dressing causes regression in wound condition, may D/C ordered dressing product/s and apply Normal Saline Moist Dressing daily until next Wound Healing Center / Other MD appointment. Notify Wound Healing Center of regression in wound condition at [phone]. o Please direct any NON-WOUND related issues/requests for orders to patient's Primary Care Physician DANAYSHA, KIRN ([id number]) Wound #5 Left Malleolus o Home Health Nurse may visit PRN to address patientos wound care needs. o FACE TO FACE ENCOUNTER: MEDICARE and MEDICAID PATIENTS: I certify that this patient is under my care and that I had a face-to-face encounter that meets the physician face-to-face encounter requirements with this patient on this date. The encounter with the patient was in whole or in part for the following MEDICAL CONDITION: (primary reason for Home Healthcare) MEDICAL NECESSITY: I certify, that based on my findings, NURSING services are a medically necessary home health service. HOME BOUND STATUS: I certify that my clinical findings support that this patient is homebound (i.e., Due to illness or injury, pt requires aid  of supportive devices such as crutches, cane, wheelchairs, walkers, the use of special transportation or the assistance of another person to leave their place of residence. There is a normal inability to leave the home and doing so requires considerable and taxing effort. Other absences are for medical reasons / religious services and are infrequent or of short duration when for other reasons). o If current dressing causes regression in wound  condition, may D/C ordered dressing product/s and apply Normal Saline Moist Dressing daily until next Wound Healing Center / Other MD appointment. Notify Wound Healing Center of regression in wound condition at [phone]. o Please direct any NON-WOUND related issues/requests for orders to patient's Primary Care Physician Wound #6 Left,Lateral Amputation Site - Transmetatarsal o Home Health Nurse may visit PRN to address patientos wound care needs. o FACE TO FACE ENCOUNTER: MEDICARE and MEDICAID PATIENTS: I certify that this patient is under my care and that I had a face-to-face encounter that meets the physician face-to-face encounter requirements with this patient on this date. The encounter with the patient was in whole or in part for the following MEDICAL CONDITION: (primary reason for Home Healthcare) MEDICAL NECESSITY: I certify, that based on my findings, NURSING services are a medically necessary home health service. HOME BOUND STATUS: I certify that my clinical findings support that this patient is homebound (i.e., Due to illness or injury, pt requires aid of supportive devices such as crutches, cane, wheelchairs, walkers, the use of special transportation or the assistance of another person to leave their place of residence. There is a normal inability to leave the home and doing so requires considerable and taxing effort. Other absences are for medical reasons / religious services and are infrequent or of short duration when for  other reasons). o If current dressing causes regression in wound condition, may D/C ordered dressing product/s and apply Normal Saline Moist Dressing daily until next Wound Healing Center / Other MD appointment. Notify Wound Healing Center of regression in wound condition at [phone]. o Please direct any NON-WOUND related issues/requests for orders to patient's Primary Care Physician Electronic Signature(s) Signed: 01/16/2017 2:28:52 PM By: Bonnell Public Signed: 01/16/2017 4:17:26 PM By: Elpidio Eric BSN, RN Entered By: Elpidio Eric on 01/16/2017 11:26:22 [name] ([id number]) -------------------------------------------------------------------------------- Problem List Details Patient Name: NOBUKO, GSELL 01/16/2017 11:15 Date of Service: AM Medical Record [id number] Number: Patient Account Number: [id number] Apr 14, 1981 (35 y.o. 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 12/29/2016 Yes of other part of foot L97.522 Non-pressure chronic ulcer of other part of left foot with fat 12/29/2016 Yes layer exposed T87.44 Infection of amputation stump, left lower extremity 12/29/2016 Yes Z99.2 Dependence on renal dialysis 12/29/2016 Yes T81.31XA Disruption of external operation (surgical) wound, not 12/29/2016 Yes elsewhere classified, initial encounter 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, unstageable 12/29/2016 Yes Inactive Problems Resolved Problems Electronic Signature(s) Signed: 01/16/2017 12:08:13 PM By: Bonnell Public Entered By: Bonnell Public on 01/16/2017 12:08:13 [name] ([name]  ([id number]) -------------------------------------------------------------------------------- Progress Note Details Patient Name: RHIANN, BOUCHER 01/16/2017 11:15 Date of Service: AM Medical Record [id number] Number: Patient Account Number: [id number] 1980-11-28 (35 y.o. Treating RN: Clover Mealy, RN, BSN, [place] Sink Date of Birth/Sex: Female) Other Clinician: Primary Care Provider: [name] Treating Lazlo Tunney Referring Provider: [name] Provider/Extender: Tania Ade in Treatment: 2 Subjective Chief Complaint Information obtained from Patient Patients presents for treatment of an open diabetic ulcer to the left transmetatarsal amputation site and this has been there since 10/08/2016 and has a left lateral heel decubitus ulcer since November 2017 History of Present Illness (HPI) The following HPI elements were documented for the patient's wound: Location: open wound  right medial foot where recent amputation was done and skin ulcers on the left third and fourth toe Quality: Patient reports experiencing a sharp pain to affected area(s). Severity: Patient states wound are getting worse. Duration: Patient has had the wound for < 2 weeks prior to presenting for treatment Timing: Pain in wound is constant (hurts all the time) Context: The wound occurred when the patient had gangrene of the right first toe and was admitted to the hospital Modifying Factors: Other treatment(s) tried include:doxycycline and Santyl ointment Associated Signs and Symptoms: Patient reports having foul odor. 36 year old patient who was seen by me on 2 different occasions in October/November 2017, has now come back to the wound center. 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 She also has a ulcer on the left heel which has been there since November 2017. Review of her electronic medical records revealed that she had a angiogram done by Dr.  Fabienne Bruns on 09/05/2016 --he did an abdominal aortogram with bilateral lower extremity runoffs, angioplasty of the right anterior tibial artery, angioplasty of the right peroneal artery. 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 At that time she was also seen by infectious disease was started on IV vancomycin and oral ciprofloxacin and metronidazole. patient says at the present time she is on Flagyl as per Dr. [name] and has not been seeing infectious disease doctors. Her last hemoglobin A1c was 5.9. ========== 36 year old patient who has a past medical history of diabetes mellitus type 1 with hyperglycemia and renal TUN, Vernee ([id number]) manifestations, hypertension, nonischemic cardiomyopathy, diabetic osteomyelitis, cellulitis of the foot, morbid obesity, lower extremity edema, cellulitis of the right lower extremity and history of having gangrene. She also is on hemodialysis Recently seen by Dr. Aldean Baker on 07/22/2016 a right great toe open wound and he had treated her for this with nitroglycerin patch changes daily and dressing for the foot with nonweightbearing and has asked her to return in 2 weeks. he had also put her on central dressings and doxycycline was prescribed for 2 weeks past surgical history is significant for amputation of the right great toe at the MTP joint on 06/25/2016 by Dr. [name]. Also status AV fistula placement, cesarean section, coronary angiogram, left third and fourth digit reconstruction surgery of her fingers, shoulder arthroscopy and tonsillectomy. She has never been a smoker. her MRI done on 06/22/2016 showed -- IMPRESSION: Ulceration on the distal great toe. Mild marrow edema in the tuft of the  distal phalanx is worrisome for osteomyelitis. Negative for abscess or septic joint. as per Dr. Audrie Lia notes she had an amputation of the right great toe and partial amputation of the first metatarsal including the sesamoids as she had a diagnosis of gangrene, osteomyelitis and had failed conservative measures and elected for surgical management. Arterial duplex studies done on 06/21/2016 showed bilateral noncompressible arteries with the right pedal artery waveform appears abnormal with monophasic flow and the left lower extremity flow was triphasic. hemoglobin A1c on September 22 was 5.1% O St. recently was 4.6% a week ago. 08/08/2016 -- the patient has seen Dr. [name] who did not recommend any further surgical intervention and asked her to continue with local care and see him back in 2 weeks.  She has not seen the infectious disease doctor. She has not got her x-rays done. The patient continues to be his most nonchalant about her care and she does not seem to be very compliant. ======= 01/16/17- she is here for follow-up evaluation of her left TMA site. She had a follow-up appointment with Dr. [name] on 4/18, the plan is for a surgical debridement versus revision. That has not been scheduled. She has an appointment with vascular medicine on 5/2. She has no appointment pending with infectious disease. She is currently on doxycycline for ear infection, not on Flagyl. She continues to use Santyl to her wounds. She had an A1c obtained in dialysis last week, result 5.2. She had an x-ray of the left foot on 4/2 which revealed no lytic destruction seen to suggest acute osteoarthritis; soft tissue defects are seen distal to first and fifth residual metatarsals which may represent unhealed surgical wounds or ulcerations. Objective Constitutional Vitals Time Taken: 11:08 AM, Height: 68 in, Weight: 240 lbs, BMI: 36.5, Temperature: 97.7 F, Pulse: 88 bpm, Respiratory Rate: 17 breaths/min, Blood Pressure:  167/94 mmHg. Integumentary (Hair, Skin) Wound #4 status is Open. Original cause of wound was Surgical Injury. The wound is located on the Left Amputation Site - Transmetatarsal. The wound measures 1.4cm length x 2.4cm width x 0.8cm depth; 2.639cm^2 area and 2.111cm^3 volume. There is Fat Layer (Subcutaneous Tissue) Exposed exposed. FAVOR, KREH ([id number]) There is no tunneling or undermining noted. There is a medium amount of serous drainage noted. The wound margin is flat and intact. There is no granulation within the wound bed. There is a large (67-100%) amount of necrotic tissue within the wound bed including Adherent Slough. The periwound skin appearance exhibited: Maceration, Erythema. The periwound skin appearance did not exhibit: Callus, Crepitus, Excoriation, Induration, Rash, Scarring, Dry/Scaly, Atrophie Blanche, Cyanosis, Ecchymosis, Hemosiderin Staining, Mottled, Pallor, Rubor. The surrounding wound skin color is noted with erythema. The periwound has tenderness on palpation. Wound #5 status is Open. Original cause of wound was Gradually Appeared. The wound is located on the Left Malleolus. The wound measures 2.5cm length x 2cm width x 0.1cm depth; 3.927cm^2 area and 0.393cm^3 volume. The wound is limited to skin breakdown. There is no tunneling or undermining noted. There is a none present amount of drainage noted. The wound margin is flat and intact. There is a large (67-100%) amount of necrotic tissue within the wound bed including Eschar. The periwound skin appearance exhibited: Dry/Scaly. Periwound temperature was noted as No Abnormality. The periwound has tenderness on palpation. Wound #6 status is Open. Original cause of wound was Surgical Injury. The wound is located on the Left,Lateral Amputation Site - Transmetatarsal. The wound measures 1.4cm length x 2cm width x 0.2cm depth; 2.199cm^2 area and 0.44cm^3 volume. There is Fat Layer (Subcutaneous Tissue) Exposed exposed.  There is no tunneling or undermining noted. There is a large amount of serous drainage noted. The wound margin is flat and intact. There is no granulation within the wound bed. There is a large (67- 100%) amount of necrotic tissue within the wound bed including Eschar and Adherent Slough. The periwound skin appearance exhibited: Scarring. The periwound skin appearance did not exhibit: Callus, Crepitus, Excoriation, Induration, Rash, Dry/Scaly, Maceration, Atrophie Blanche, Cyanosis, Ecchymosis, Hemosiderin Staining, Mottled, Pallor, Rubor, Erythema. Periwound temperature was noted as No Abnormality. The periwound has tenderness on palpation. 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 She anticipates to be notified next week of a surgical date. According to her, the surgical plan is to debride and close. she was encouraged to contact the wound care center with the surgical date Plan Wound Cleansing: Wound #4 Left Amputation Site - Transmetatarsal: Cleanse wound with mild soap and water Wound #5 Left Malleolus: Cleanse wound with mild soap and water Wound #6 Left,Lateral Amputation Site - Transmetatarsal: Cleanse wound with mild soap and water Anesthetic: Wound #4 Left Amputation Site - Transmetatarsal: Topical Lidocaine 4% cream applied to wound bed prior to debridement Wound  #6 Left,Lateral Amputation Site - Transmetatarsal: Topical Lidocaine 4% cream applied to wound bed prior to debridement Primary Wound Dressing: Wound #4 Left Amputation Site - Transmetatarsal: Santyl Ointment Wound #5 Left Malleolus: Other: - Betadine Wound #6 Left,Lateral Amputation Site - Transmetatarsal: Santyl Ointment Secondary Dressing: Wound #4 Left Amputation Site - Transmetatarsal: Gauze, ABD and Kerlix/Conform Wound #5 Left Malleolus: Gauze, ABD and Kerlix/Conform Wound #6 Left,Lateral Amputation Site - Transmetatarsal: Gauze, ABD and Kerlix/Conform Dressing Change Frequency: Wound #4 Left Amputation Site - Transmetatarsal: Change dressing every day. Wound #5 Left Malleolus: Change dressing every day. Wound #6 Left,Lateral Amputation Site - Transmetatarsal: Change dressing every day. Follow-up Appointments: Wound #4 Left Amputation Site - Transmetatarsal: Return Appointment in 1 week. Wound #5 Left Malleolus: Return Appointment in 1 week. Wound #6 Left,Lateral Amputation Site - Transmetatarsal: Return Appointment in 1 week. Off-Loading: Wound #5 Left Malleolus: Other: - Keep pressure off heel KRISTYE, BRUHL ([id number]) Home Health: Wound #4 Left Amputation Site - Transmetatarsal: Initiate Home Health for Skilled Nursing - [place] to see patient on 01/22/17 Home Health Nurse may visit PRN to address patient s wound care needs. FACE TO FACE ENCOUNTER: MEDICARE and MEDICAID PATIENTS: I certify that this patient is under my care and that I had a face-to-face encounter that meets the physician face-to-face encounter requirements with this patient on this date. The encounter with the patient was in whole or in part for the following MEDICAL CONDITION: (primary reason for Home Healthcare) MEDICAL NECESSITY: I certify, that based on my findings, NURSING services are a medically necessary home health service. HOME BOUND STATUS: I certify that my clinical findings support that  this patient is homebound (i.e., Due to illness or injury, pt requires aid of supportive devices such as crutches, cane, wheelchairs, walkers, the use of special transportation or the assistance of another person to leave their place of residence. There is a normal inability to leave the home and doing so requires considerable and taxing effort. Other absences are for medical reasons / religious services and are infrequent or of short duration when for other reasons). If current dressing causes regression in wound condition, may D/C ordered dressing product/s and apply Normal Saline Moist Dressing daily until next Wound Healing Center / Other MD appointment. Notify Wound Healing Center of regression in wound condition at [phone]. Please direct any NON-WOUND related issues/requests for orders to patient's Primary Care Physician Wound #5 Left Malleolus: Home Health Nurse may visit PRN to address  patient s wound care needs. FACE TO FACE ENCOUNTER: MEDICARE and MEDICAID PATIENTS: I certify that this patient is under my care and that I had a face-to-face encounter that meets the physician face-to-face encounter requirements with this patient on this date. The encounter with the patient was in whole or in part for the following MEDICAL CONDITION: (primary reason for Home Healthcare) MEDICAL NECESSITY: I certify, that based on my findings, NURSING services are a medically necessary home health service. HOME BOUND STATUS: I certify that my clinical findings support that this patient is homebound (i.e., Due to illness or injury, pt requires aid of supportive devices such as crutches, cane, wheelchairs, walkers, the use of special transportation or the assistance of another person to leave their place of residence. There is a normal inability to leave the home and doing so requires considerable and taxing effort. Other absences are for medical reasons / religious services and are infrequent or of  short duration when for other reasons). If current dressing causes regression in wound condition, may D/C ordered dressing product/s and apply Normal Saline Moist Dressing daily until next Wound Healing Center / Other MD appointment. Notify Wound Healing Center of regression in wound condition at [phone]. Please direct any NON-WOUND related issues/requests for orders to patient's Primary Care Physician Wound #6 Left,Lateral Amputation Site - Transmetatarsal: Home Health Nurse may visit PRN to address patient s wound care needs. FACE TO FACE ENCOUNTER: MEDICARE and MEDICAID PATIENTS: I certify that this patient is under my care and that I had a face-to-face encounter that meets the physician face-to-face encounter requirements with this patient on this date. The encounter with the patient was in whole or in part for the following MEDICAL CONDITION: (primary reason for Home Healthcare) MEDICAL NECESSITY: I certify, that based on my findings, NURSING services are a medically necessary home health service. HOME BOUND STATUS: I certify that my clinical findings support that this patient is homebound (i.e., Due to illness or injury, pt requires aid of supportive devices such as crutches, cane, wheelchairs, walkers, the use of special transportation or the assistance of another person to leave their place of residence. There is a normal inability to leave the home and doing so requires considerable and taxing effort. Other absences are for medical reasons / religious services and are infrequent or of short duration when for other reasons). If current dressing causes regression in wound condition, may D/C ordered dressing product/s and apply Normal Saline Moist Dressing daily until next Wound Healing Center / Other MD appointment. Notify Wound Healing Center of regression in wound condition at [phone]. Please direct any NON-WOUND related issues/requests for orders to patient's Primary Care  Physician NERIYAH, CERCONE ([id number]) 1. Continue with Santyl, daily 2. Continue with surgical intervention per Dr. [name] 3. Follow up next week Electronic Signature(s) Signed: 01/16/2017 12:54:18 PM By: Bonnell Public Entered By: Bonnell Public on 01/16/2017 12:54:18 [name] ([id number]) -------------------------------------------------------------------------------- SuperBill Details Patient Name: [name] Date of Service: 01/16/2017 Medical Record Patient Account Number: [id number] Number: Afful, RN, BSN, Treating RN: 09-Nov-1980 (35 y.o. [place] Date of Birth/Sex: Female) Other Clinician: Primary Care Provider: [name] Treating Marria Mathison Referring Provider: [name] Provider/Extender: Tania Ade in Treatment: 2 Diagnosis Coding ICD-10 Codes Code Description E10.621 Type 1 diabetes mellitus with foot ulcer I70.245 Atherosclerosis of native arteries of left leg with ulceration of other part of foot L97.522 Non-pressure chronic ulcer of other part of left foot with fat layer exposed T87.44 Infection of amputation stump, left lower  extremity Z99.2 Dependence on renal dialysis Disruption of external operation (surgical) wound, not elsewhere classified, initial T81.31XA encounter 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, unstageable Facility Procedures CPT4 Code: 16109604 Description: 99214 - WOUND CARE VISIT-LEV 4 EST PT Modifier: Quantity: 1 Physician Procedures CPT4 Code: 5409811 Description: 99213 - WC PHYS LEVEL 3 - EST PT ICD-10 Description Diagnosis E10.621 Type 1 diabetes mellitus with foot ulcer Modifier: Quantity: 1 Electronic Signature(s) Signed: 01/16/2017 12:54:28 PM By: Bonnell Public Entered By: Bonnell Public on 01/16/2017 12:54:28

## 2017-01-19 ENCOUNTER — Encounter (HOSPITAL_COMMUNITY): Payer: Self-pay | Admitting: *Deleted

## 2017-01-19 NOTE — Progress Notes (Signed)
Pt stated that she is usually instructed to hold Lantus Insulin the night before procedure. Pt stated " the last time I was there, my sugar was 44." Pt stated that her fasting BG is usually between 70-90. Pt made aware to check BG every 2 hours prior to arrival, treat BG< 70 with 4 ounces of apple juice, wait 15 minutes after drinking juice and recheck BG, if BG still remains  < 70, call SS unit and speak with a nurse. If BG > 220 take half correction dose of Novolog insulin. Pt verbalized understanding of all pre-op instructions.

## 2017-01-19 NOTE — Progress Notes (Signed)
Pt denies SOB and chest pain but is under the care of Dr. Allyson Sabal, Cardiology. Pt denies having an EKG within the last year. Pt made aware to stop taking vitamins, fish oil and herbal medications. Do not take any NSAIDs ie: Ibuprofen, Advil, Naproxen, BC and Goody Powder. Pt verbalized understanding of all pre-op instructions.

## 2017-01-20 ENCOUNTER — Other Ambulatory Visit (INDEPENDENT_AMBULATORY_CARE_PROVIDER_SITE_OTHER): Payer: Self-pay | Admitting: Orthopedic Surgery

## 2017-01-21 ENCOUNTER — Ambulatory Visit: Payer: Medicaid Other | Admitting: Vascular Surgery

## 2017-01-21 ENCOUNTER — Encounter (HOSPITAL_COMMUNITY)
Admission: RE | Disposition: A | Discharge status: Home / Self Care | Payer: Self-pay | Source: Ambulatory Visit | Attending: Orthopedic Surgery

## 2017-01-21 ENCOUNTER — Ambulatory Visit (HOSPITAL_COMMUNITY): Payer: Medicaid Other | Admitting: Certified Registered Nurse Anesthetist

## 2017-01-21 ENCOUNTER — Other Ambulatory Visit (INDEPENDENT_AMBULATORY_CARE_PROVIDER_SITE_OTHER): Payer: Self-pay | Admitting: Family

## 2017-01-21 ENCOUNTER — Encounter (HOSPITAL_COMMUNITY): Payer: Self-pay | Admitting: Urology

## 2017-01-21 ENCOUNTER — Telehealth (INDEPENDENT_AMBULATORY_CARE_PROVIDER_SITE_OTHER): Payer: Self-pay | Admitting: Orthopedic Surgery

## 2017-01-21 ENCOUNTER — Ambulatory Visit (HOSPITAL_COMMUNITY)
Admission: RE | Admit: 2017-01-21 | Discharge: 2017-01-21 | Disposition: A | Discharge status: Home / Self Care | Payer: Medicaid Other | Source: Ambulatory Visit | Attending: Orthopedic Surgery | Admitting: Orthopedic Surgery

## 2017-01-21 ENCOUNTER — Encounter: Payer: Self-pay | Admitting: Vascular Surgery

## 2017-01-21 DIAGNOSIS — Z888 Allergy status to other drugs, medicaments and biological substances status: Secondary | ICD-10-CM | POA: Insufficient documentation

## 2017-01-21 DIAGNOSIS — I252 Old myocardial infarction: Secondary | ICD-10-CM | POA: Insufficient documentation

## 2017-01-21 DIAGNOSIS — I130 Hypertensive heart and chronic kidney disease with heart failure and stage 1 through stage 4 chronic kidney disease, or unspecified chronic kidney disease: Secondary | ICD-10-CM | POA: Insufficient documentation

## 2017-01-21 DIAGNOSIS — Y838 Other surgical procedures as the cause of abnormal reaction of the patient, or of later complication, without mention of misadventure at the time of the procedure: Secondary | ICD-10-CM | POA: Diagnosis not present

## 2017-01-21 DIAGNOSIS — E1151 Type 2 diabetes mellitus with diabetic peripheral angiopathy without gangrene: Secondary | ICD-10-CM | POA: Insufficient documentation

## 2017-01-21 DIAGNOSIS — G43909 Migraine, unspecified, not intractable, without status migrainosus: Secondary | ICD-10-CM | POA: Insufficient documentation

## 2017-01-21 DIAGNOSIS — E1140 Type 2 diabetes mellitus with diabetic neuropathy, unspecified: Secondary | ICD-10-CM | POA: Insufficient documentation

## 2017-01-21 DIAGNOSIS — I509 Heart failure, unspecified: Secondary | ICD-10-CM | POA: Diagnosis not present

## 2017-01-21 DIAGNOSIS — J449 Chronic obstructive pulmonary disease, unspecified: Secondary | ICD-10-CM | POA: Insufficient documentation

## 2017-01-21 DIAGNOSIS — T8781 Dehiscence of amputation stump: Secondary | ICD-10-CM | POA: Diagnosis present

## 2017-01-21 DIAGNOSIS — M9689 Other intraoperative and postprocedural complications and disorders of the musculoskeletal system: Secondary | ICD-10-CM | POA: Insufficient documentation

## 2017-01-21 DIAGNOSIS — G2581 Restless legs syndrome: Secondary | ICD-10-CM | POA: Insufficient documentation

## 2017-01-21 DIAGNOSIS — Z886 Allergy status to analgesic agent status: Secondary | ICD-10-CM | POA: Diagnosis not present

## 2017-01-21 DIAGNOSIS — E785 Hyperlipidemia, unspecified: Secondary | ICD-10-CM | POA: Diagnosis not present

## 2017-01-21 DIAGNOSIS — M19042 Primary osteoarthritis, left hand: Secondary | ICD-10-CM | POA: Insufficient documentation

## 2017-01-21 DIAGNOSIS — E1122 Type 2 diabetes mellitus with diabetic chronic kidney disease: Secondary | ICD-10-CM | POA: Insufficient documentation

## 2017-01-21 DIAGNOSIS — N189 Chronic kidney disease, unspecified: Secondary | ICD-10-CM | POA: Diagnosis not present

## 2017-01-21 DIAGNOSIS — I2510 Atherosclerotic heart disease of native coronary artery without angina pectoris: Secondary | ICD-10-CM | POA: Insufficient documentation

## 2017-01-21 DIAGNOSIS — Z794 Long term (current) use of insulin: Secondary | ICD-10-CM | POA: Diagnosis not present

## 2017-01-21 DIAGNOSIS — Z882 Allergy status to sulfonamides status: Secondary | ICD-10-CM | POA: Diagnosis not present

## 2017-01-21 DIAGNOSIS — D649 Anemia, unspecified: Secondary | ICD-10-CM | POA: Diagnosis not present

## 2017-01-21 HISTORY — DX: Heart failure, unspecified: I50.9

## 2017-01-21 HISTORY — PX: STUMP REVISION: SHX6102

## 2017-01-21 LAB — GLUCOSE, CAPILLARY
Glucose-Capillary: 88 mg/dL (ref 65–99)
Glucose-Capillary: 93 mg/dL (ref 65–99)

## 2017-01-21 LAB — HCG, SERUM, QUALITATIVE: PREG SERUM: NEGATIVE

## 2017-01-21 SURGERY — REVISION, AMPUTATION SITE
Anesthesia: General | Site: Leg Lower | Laterality: Left

## 2017-01-21 MED ORDER — OXYCODONE-ACETAMINOPHEN 5-325 MG PO TABS: 1.0000 | ORAL_TABLET | ORAL | 0 refills | Status: DC | PRN

## 2017-01-21 MED ORDER — FENTANYL CITRATE (PF) 100 MCG/2ML IJ SOLN
INTRAMUSCULAR | Status: AC
  Filled 2017-01-21: qty 2

## 2017-01-21 MED ORDER — OXYCODONE-ACETAMINOPHEN 5-325 MG PO TABS
ORAL_TABLET | ORAL | Status: AC
  Administered 2017-01-21: 1
  Filled 2017-01-21: qty 1

## 2017-01-21 MED ORDER — OXYCODONE-ACETAMINOPHEN 5-325 MG PO TABS: 1.0000 | ORAL_TABLET | Freq: Once | ORAL | Status: DC

## 2017-01-21 MED ORDER — FENTANYL CITRATE (PF) 100 MCG/2ML IJ SOLN
25.0000 ug | INTRAMUSCULAR | Status: DC | PRN
  Administered 2017-01-21: 25 ug via INTRAVENOUS
  Administered 2017-01-21 (×2): 50 ug via INTRAVENOUS
  Administered 2017-01-21: 25 ug via INTRAVENOUS

## 2017-01-21 MED ORDER — SODIUM CHLORIDE 0.9 % IV SOLN
INTRAVENOUS | Status: DC
  Administered 2017-01-21: 12:00:00 via INTRAVENOUS

## 2017-01-21 MED ORDER — CEFAZOLIN SODIUM-DEXTROSE 2-4 GM/100ML-% IV SOLN
2.0000 g | INTRAVENOUS | Status: AC
  Administered 2017-01-21: 2 g via INTRAVENOUS
  Filled 2017-01-21: qty 100

## 2017-01-21 MED ORDER — CHLORHEXIDINE GLUCONATE 4 % EX LIQD: 60.0000 mL | Freq: Once | CUTANEOUS | Status: DC

## 2017-01-21 MED ORDER — ONDANSETRON HCL 4 MG/2ML IJ SOLN: 4.0000 mg | Freq: Once | INTRAMUSCULAR | Status: DC | PRN

## 2017-01-21 MED ORDER — LIDOCAINE HCL (CARDIAC) 20 MG/ML IV SOLN
INTRAVENOUS | Status: DC | PRN
  Administered 2017-01-21: 40 mg via INTRATRACHEAL

## 2017-01-21 MED ORDER — 0.9 % SODIUM CHLORIDE (POUR BTL) OPTIME
TOPICAL | Status: DC | PRN
  Administered 2017-01-21: 1000 mL

## 2017-01-21 MED ORDER — MIDAZOLAM HCL 2 MG/2ML IJ SOLN
INTRAMUSCULAR | Status: AC
  Filled 2017-01-21: qty 2

## 2017-01-21 MED ORDER — MIDAZOLAM HCL 2 MG/2ML IJ SOLN
INTRAMUSCULAR | Status: DC | PRN
  Administered 2017-01-21: 2 mg via INTRAVENOUS

## 2017-01-21 MED ORDER — FENTANYL CITRATE (PF) 250 MCG/5ML IJ SOLN
INTRAMUSCULAR | Status: AC
  Filled 2017-01-21: qty 5

## 2017-01-21 MED ORDER — PROPOFOL 10 MG/ML IV BOLUS
INTRAVENOUS | Status: DC | PRN
  Administered 2017-01-21: 30 mg via INTRAVENOUS
  Administered 2017-01-21: 150 mg via INTRAVENOUS

## 2017-01-21 MED ORDER — FENTANYL CITRATE (PF) 250 MCG/5ML IJ SOLN
INTRAMUSCULAR | Status: DC | PRN
  Administered 2017-01-21 (×2): 50 ug via INTRAVENOUS

## 2017-01-21 MED ORDER — ONDANSETRON HCL 4 MG/2ML IJ SOLN
INTRAMUSCULAR | Status: DC | PRN
  Administered 2017-01-21: 4 mg via INTRAVENOUS

## 2017-01-21 SURGICAL SUPPLY — 37 items
BLADE SAW RECIP 87.9 MT (BLADE) IMPLANT
BLADE SURG 21 STRL SS (BLADE) ×3 IMPLANT
COVER SURGICAL LIGHT HANDLE (MISCELLANEOUS) ×3 IMPLANT
DRAPE EXTREMITY T 121X128X90 (DRAPE) ×3 IMPLANT
DRAPE HALF SHEET 40X57 (DRAPES) ×3 IMPLANT
DRAPE INCISE IOBAN 66X45 STRL (DRAPES) ×6 IMPLANT
DRAPE U-SHAPE 47X51 STRL (DRAPES) ×6 IMPLANT
DRSG VAC ATS MED SENSATRAC (GAUZE/BANDAGES/DRESSINGS) ×3 IMPLANT
DURAPREP 26ML APPLICATOR (WOUND CARE) ×3 IMPLANT
ELECT REM PT RETURN 9FT ADLT (ELECTROSURGICAL) ×3
ELECTRODE REM PT RTRN 9FT ADLT (ELECTROSURGICAL) ×1 IMPLANT
GLOVE BIO SURGEON STRL SZ 6.5 (GLOVE) ×2 IMPLANT
GLOVE BIO SURGEONS STRL SZ 6.5 (GLOVE) ×1
GLOVE BIOGEL PI IND STRL 7.5 (GLOVE) ×1 IMPLANT
GLOVE BIOGEL PI IND STRL 9 (GLOVE) ×1 IMPLANT
GLOVE BIOGEL PI INDICATOR 7.5 (GLOVE) ×2
GLOVE BIOGEL PI INDICATOR 9 (GLOVE) ×2
GLOVE SKINSENSE NS SZ7.0 (GLOVE) ×4
GLOVE SKINSENSE STRL SZ7.0 (GLOVE) ×2 IMPLANT
GLOVE SURG ORTHO 9.0 STRL STRW (GLOVE) ×3 IMPLANT
GOWN STRL REUS W/ TWL LRG LVL3 (GOWN DISPOSABLE) ×1 IMPLANT
GOWN STRL REUS W/ TWL XL LVL3 (GOWN DISPOSABLE) ×2 IMPLANT
GOWN STRL REUS W/TWL LRG LVL3 (GOWN DISPOSABLE) ×2
GOWN STRL REUS W/TWL XL LVL3 (GOWN DISPOSABLE) ×4
KIT BASIN OR (CUSTOM PROCEDURE TRAY) ×3 IMPLANT
KIT ROOM TURNOVER OR (KITS) ×3 IMPLANT
MANIFOLD NEPTUNE II (INSTRUMENTS) ×3 IMPLANT
NS IRRIG 1000ML POUR BTL (IV SOLUTION) ×3 IMPLANT
PACK GENERAL/GYN (CUSTOM PROCEDURE TRAY) ×3 IMPLANT
PAD ARMBOARD 7.5X6 YLW CONV (MISCELLANEOUS) ×3 IMPLANT
PAD NEG PRESSURE SENSATRAC (MISCELLANEOUS) IMPLANT
PREVENA INCISION MGT 90 150 (MISCELLANEOUS) ×3 IMPLANT
STAPLER VISISTAT 35W (STAPLE) IMPLANT
SUT ETHILON 2 0 PSLX (SUTURE) ×6 IMPLANT
SUT SILK 2 0 (SUTURE)
SUT SILK 2-0 18XBRD TIE 12 (SUTURE) IMPLANT
TOWEL OR 17X26 10 PK STRL BLUE (TOWEL DISPOSABLE) ×3 IMPLANT

## 2017-01-21 NOTE — Transfer of Care (Signed)
Immediate Anesthesia Transfer of Care Note  Patient: [name]  Procedure(s) Performed: Procedure(s): . Revision Left Transmetatarsal Amputation (Left)  Patient Location: PACU  Anesthesia Type:General  Level of Consciousness: drowsy and patient cooperative  Airway & Oxygen Therapy: Patient Spontanous Breathing and Patient connected to nasal cannula oxygen  Post-op Assessment: Report given to RN, Post -op Vital signs reviewed and stable and Patient moving all extremities X 4  Post vital signs: Reviewed and stable  Last Vitals:  Vitals:   01/21/17 1312 01/21/17 1315  BP:  135/84  Pulse: 74 75  Resp: 11 20  Temp: 36.5 C     Last Pain:  Vitals:   01/21/17 1002  TempSrc: Oral  PainSc:       Patients Stated Pain Goal: 3 (01/21/17 0955)  Complications: No apparent anesthesia complications

## 2017-01-21 NOTE — Telephone Encounter (Signed)
I called and spoke with patient over the phone advised that prior authorization request was submitted and pending insurance approval. Since this is a Charity fundraiser must review document that was attached which is patient's operative note from today. Advised her unfortunately they will not expedite her request. I have called and asked [employer] myself about that for her. Can you please go onto [employer] and review PA request status. Her confirmation number is [id number] W. Her recipient ID is [id number] K. Thank you. I just checked on one that Autumn and I had done yesterday and response was generated.

## 2017-01-21 NOTE — Telephone Encounter (Signed)
Submitted online with patients post op notes. Pending authorization from insurance company for review.

## 2017-01-21 NOTE — Op Note (Signed)
01/21/2017  1:03 PM  PATIENT:  [name]    PRE-OPERATIVE DIAGNOSIS:  Dehiscence Left Transmetatarsal Amputation  POST-OPERATIVE DIAGNOSIS:  Same  PROCEDURE:  . Revision Left Transmetatarsal Amputation  SURGEON:  Nadara Mustard, MD  PHYSICIAN ASSISTANT:None ANESTHESIA:   General  PREOPERATIVE INDICATIONS:  [name] is a  36 y.o. female with a diagnosis of Dehiscence Left Transmetatarsal Amputation who failed conservative measures and elected for surgical management.    The risks benefits and alternatives were discussed with the patient preoperatively including but not limited to the risks of infection, bleeding, nerve injury, cardiopulmonary complications, the need for revision surgery, among others, and the patient was willing to proceed.  OPERATIVE IMPLANTS: Prevena wound VAC  OPERATIVE FINDINGS: No abscess minimal petechial bleeding muscle had good contractility and color  OPERATIVE PROCEDURE: Patient was brought to the operating room and underwent a general anesthetic. After adequate levels anesthesia were obtained patient's left lower extremity was prepped using DuraPrep draped into a sterile field a timeout was called. Examination of the wounds these had tunnels that went all the way down to all the metatarsal heads. It was determined at this time to proceed with revision of the transmetatarsal amputation. A fishmouth incision was used to encompass both ulcers. This was carried sharply down to bone a reciprocating saw was used to perform a transmetatarsal amputation through the base of the metatarsals. The wound was irrigated with normal saline electrocautery was used for hemostasis. Incision was closed using 2-0 nylon. A Prevena wound VAC was applied this had a good suction fit patient was extubated taken to the PACU in stable condition.

## 2017-01-21 NOTE — Telephone Encounter (Signed)
Pt calling to see if we can get authorization for her pain meds for Medicaid. She just had surgery and cannot get them until authorized.   [phone]

## 2017-01-21 NOTE — Telephone Encounter (Signed)
Oxycodone 5/325 medication was approved by insurance. I called patient to make her aware to have pharmacy  rerun.

## 2017-01-21 NOTE — H&P (Signed)
[name] is an 36 y.o. female.   Chief Complaint: Dehiscence left transmetatarsal amputation. HPI: Patient is a 36 year old woman diabetic insensate neuropathy peripheral vascular disease who is status post limb salvage intervention with a transmetatarsal amputation. Patient has had progressive dehiscence and presents at this time for revision transmetatarsal amputation.  Past Medical History:  Diagnosis Date  . Anemia   . Arthritis    "left hand" (09/15/2013)  . Asthma   . CHF (congestive heart failure) (HCC)   . Chronic bronchitis (HCC)    "just about q yr" (09/15/2013)  . Chronic kidney disease    "low kidney function" (09/15/2013) ,  T/Th/Sa  . COPD (chronic obstructive pulmonary disease) (HCC)   . Coronary artery disease   . Hyperlipidemia   . Hypertension   . Migraine    "get them alot" (09/15/2013)  . Myocardial infarction [facility]) 04/2015   NSTEMI  . Normal coronary arteries    by cardiac catheterization 09/20/13  . Peripheral vascular disease (HCC)   . Pneumonia    "couple times; have it now" (09/15/2013)  . PONV (postoperative nausea and vomiting)   . Restless legs   . Shortness of breath    "just recently; related to the pneumonia" (09/15/2013)  . Type 1 diabetes mellitus (HCC)    type 2    Past Surgical History:  Procedure Laterality Date  . AMPUTATION Right 06/25/2016   Procedure: Amputation Right Great Toe at the Metatarsophalangeal Joint;  Surgeon: Beth Mustard, MD;  Location: [facility] OR;  Service: Orthopedics;  Laterality: Right;  . AMPUTATION Bilateral 10/08/2016   Procedure: Bilateral Transmetatarsal Amputation;  Surgeon: Beth Mustard, MD;  Location: [facility] OR;  Service: Orthopedics;  Laterality: Bilateral;  . AV FISTULA PLACEMENT Left 03/27/2015   Procedure: CREATION RADIAL CEPHALIC ARTERIOVENOUS FISTULA;  Surgeon: Chuck Hint, MD;  Location: [facility] OR;  Service: Vascular;  Laterality: Left;  . AV FISTULA PLACEMENT Left 11/23/2015   Procedure:  LEFT ARM BASILIC  VEIN TRANSPOSITION  ;  Surgeon: Chuck Hint, MD;  Location: [facility] OR;  Service: Vascular;  Laterality: Left;  . CESAREAN SECTION  1999; 2006  . CORONARY ANGIOGRAM  09/20/2013   Procedure: CORONARY ANGIOGRAM;  Surgeon: Runell Gess, MD;  Location: [facility] CATH LAB;  Service: Cardiovascular;;  . FINGER SURGERY Left 1985   3rd and 4th digits reconstructed after cut off" (09/15/2013)  . PERIPHERAL VASCULAR CATHETERIZATION N/A 09/05/2016   Procedure: Abdominal Aortogram;  Surgeon: Sherren Kerns, MD;  Location: [facility] INVASIVE CV LAB;  Service: Cardiovascular;  Laterality: N/A;  . PERIPHERAL VASCULAR CATHETERIZATION Bilateral 09/05/2016   Procedure: Lower Extremity Angiography;  Surgeon: Sherren Kerns, MD;  Location: [facility] INVASIVE CV LAB;  Service: Cardiovascular;  Laterality: Bilateral;  . PERIPHERAL VASCULAR CATHETERIZATION Right 09/05/2016   Procedure: Peripheral Vascular Balloon Angioplasty;  Surgeon: Sherren Kerns, MD;  Location: [facility] INVASIVE CV LAB;  Service: Cardiovascular;  Laterality: Right;  peroneal and AT  . SHOULDER ARTHROSCOPY WITH BICEPSTENOTOMY Right 05/10/2015   Procedure: RIGHT SHOULDER ARTHROSCOPY WITH BICEPS TENOTOMY, DEBRIDEMENT LABRAL TEAR;  Surgeon: Jones Broom, MD;  Location: [facility] OR;  Service: Orthopedics;  Laterality: Right;  Right shoulder arthroscopy biceps tenotomy, debridement labral tear  . TONSILLECTOMY  1997  . TUBAL LIGATION  2006    Family History  Problem Relation Age of Onset  . Diabetes Mother   . Stroke Mother   . Hypertension Father   . Hyperlipidemia Father   . Cancer - Lung Father   .  Diabetes Maternal Grandmother   . Cancer Paternal Grandmother    Social History:  reports that she has never smoked. She has never used smokeless tobacco. She reports that she does not drink alcohol or use drugs.  Allergies:  Allergies  Allergen Reactions  . Aspirin Anaphylaxis  . Sulfur Hives  . Tramadol Hives and Other (See Comments)    Pt states she feels  weird     No prescriptions prior to admission.    No results found for this or any previous visit (from the past 48 hour(s)). No results found.  Review of Systems  All other systems reviewed and are negative.   Last menstrual period 01/03/2017. Physical Exam  On examination patient is alert oriented no adenopathy well-dressed of left wrist referable she has dehiscence of the transmetatarsal amputation. She has a palpable pulse. 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  Plan: We will plan for transmetatarsal amputation revision. Risks and benefits were discussed including need for higher level amputation. Patient states she understands wish to proceed at this time.  Beth Mustard, MD 01/21/2017, 6:13 AM

## 2017-01-21 NOTE — Anesthesia Procedure Notes (Signed)
Procedure Name: LMA Insertion Date/Time: 01/21/2017 12:36 PM Performed by: Burt Ek Pre-anesthesia Checklist: Patient identified, Emergency Drugs available, Suction available and Patient being monitored Patient Re-evaluated:Patient Re-evaluated prior to inductionOxygen Delivery Method: Circle system utilized Preoxygenation: Pre-oxygenation with 100% oxygen Intubation Type: IV induction Ventilation: Mask ventilation without difficulty LMA: LMA inserted LMA Size: 4.0 Number of attempts: 1 Placement Confirmation: positive ETCO2 and breath sounds checked- equal and bilateral Tube secured with: Tape Dental Injury: Teeth and Oropharynx as per pre-operative assessment

## 2017-01-21 NOTE — Anesthesia Postprocedure Evaluation (Addendum)
Anesthesia Post Note  Patient: [name]  Procedure(s) Performed: Procedure(s) (LRB): . Revision Left Transmetatarsal Amputation (Left)  Patient location during evaluation: PACU Anesthesia Type: General Level of consciousness: awake, awake and alert and oriented Pain management: pain level controlled Vital Signs Assessment: post-procedure vital signs reviewed and stable Respiratory status: spontaneous breathing, nonlabored ventilation and respiratory function stable       Last Vitals:  Vitals:   01/21/17 1422 01/21/17 1435  BP:  (!) 156/98  Pulse: 75 74  Resp: 12 16  Temp: 36.1 C     Last Pain:  Vitals:   01/21/17 1408  TempSrc:   PainSc: 8                  [name]

## 2017-01-21 NOTE — Anesthesia Preprocedure Evaluation (Signed)

## 2017-01-22 ENCOUNTER — Encounter (HOSPITAL_COMMUNITY): Payer: Self-pay | Admitting: Orthopedic Surgery

## 2017-01-22 LAB — POCT I-STAT 4, (NA,K, GLUC, HGB,HCT)
GLUCOSE: 89 mg/dL (ref 65–99)
HEMATOCRIT: 40 % (ref 36.0–46.0)
Hemoglobin: 13.6 g/dL (ref 12.0–15.0)
Potassium: 4 mmol/L (ref 3.5–5.1)
SODIUM: 140 mmol/L (ref 135–145)

## 2017-01-26 ENCOUNTER — Ambulatory Visit: Payer: Medicaid Other | Admitting: Surgery

## 2017-01-28 ENCOUNTER — Telehealth (INDEPENDENT_AMBULATORY_CARE_PROVIDER_SITE_OTHER): Payer: Self-pay

## 2017-01-28 ENCOUNTER — Ambulatory Visit (INDEPENDENT_AMBULATORY_CARE_PROVIDER_SITE_OTHER): Payer: Medicaid Other | Admitting: Orthopedic Surgery

## 2017-01-28 ENCOUNTER — Encounter (INDEPENDENT_AMBULATORY_CARE_PROVIDER_SITE_OTHER): Payer: Self-pay | Admitting: Orthopedic Surgery

## 2017-01-28 ENCOUNTER — Ambulatory Visit: Payer: Medicaid Other | Admitting: Vascular Surgery

## 2017-01-28 ENCOUNTER — Telehealth (INDEPENDENT_AMBULATORY_CARE_PROVIDER_SITE_OTHER): Payer: Self-pay | Admitting: Orthopedic Surgery

## 2017-01-28 VITALS — Ht 68.0 in | Wt 243.0 lb

## 2017-01-28 DIAGNOSIS — Z89432 Acquired absence of left foot: Secondary | ICD-10-CM

## 2017-01-28 NOTE — Progress Notes (Signed)
Office Visit Note   Patient: [name]           Date of Birth: 12-Feb-1981           MRN: [id number] Visit Date: 01/28/2017              Requested by: [name], FNP [address] [place], [place] [address] PCP: [name], FNP  Chief Complaint  Patient presents with  . Left Foot - Routine Post Op    01/21/17 Revision left transmetatarsal amputation 1 week post op       HPI: Patient is a 36 year old woman who presents status post revision left transmetatarsal amputation 1 week out from surgery. Patient has the Prevena wound VAC removed today there is some mild maceration.  Assessment & Plan: Visit Diagnoses:  1. Status post transmetatarsal amputation of left foot (HCC)     Plan: Start Dial soap cleansing 4 x 4 gauze plus Ace wrap continue nonweightbearing continue to keep pressure off the heel work on dorsiflexion of the ankle.  Follow-Up Instructions: Return in about 1 week (around 02/04/2017).   Ortho Exam  Patient is alert, oriented, no adenopathy, well-dressed, normal affect, normal respiratory effort. Patient is ambulating in a wheelchair. Examination she has a decubitus heel ulcer which is stable this is about 1 cm in diameter there is good healthy tissue at the base no cellulitis no signs of infection. Her transmetatarsal amputation as well approximated there is no cellulitis no odor is a very small amount of serosanguineous drainage. The wound VAC canisters fall and the wound VAC last that the entire week.  Imaging: No results found.  Labs: Lab Results  Component Value Date   HGBA1C 5.2 10/08/2016   HGBA1C 5.1 06/20/2016   HGBA1C 7.4 (H) 03/19/2015   ESRSEDRATE 38 (H) 09/27/2016   ESRSEDRATE 28 (H) 06/23/2016   ESRSEDRATE 28 (H) 06/20/2016   CRP 0.6 06/23/2016   CRP 0.8 06/20/2016   REPTSTATUS 09/26/2016 FINAL 09/21/2016   GRAMSTAIN  09/21/2014    RARE WBC NO SQUAMOUS EPITHELIAL CELLS SEEN FEW GRAM POSITIVE COCCI IN  CLUSTERS Performed at [facility]    CULT NO GROWTH 5 DAYS 09/21/2016   LABORGA METHICILLIN RESISTANT STAPHYLOCOCCUS AUREUS 09/21/2014    Orders:  No orders of the defined types were placed in this encounter.  No orders of the defined types were placed in this encounter.    Procedures: No procedures performed  Clinical Data: No additional findings.  ROS:  All other systems negative, except as noted in the HPI. Review of Systems  Objective: Vital Signs: Ht 5\' 8"  (1.727 m)   Wt 243 lb (110.2 kg)   LMP 01/03/2017 (Exact Date)   BMI 36.95 kg/m   Specialty Comments:  No specialty comments available.  PMFS History: Patient Active Problem List   Diagnosis Date Noted  . Dehiscence of amputation stump (HCC)   . Status post transmetatarsal amputation of left foot (HCC) 10/21/2016  . Status post transmetatarsal amputation of right foot (HCC) 10/08/2016  . 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 Diabetic osteomyelitis (HCC)   . 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 Diabetic foot infection (HCC) 06/20/2016  . ESRD (end stage renal disease) on dialysis (HCC) 06/20/2016  . Diabetic foot ulcer (HCC) 06/20/2016  . Pre-operative clearance 10/12/2015  . Normal coronary arteries 10/12/2015  . NSTEMI- type 2, Troponin 11.2 05/11/2015  . Respiratory failure requiring intubation (  HCC) 05/10/2015  . History of arthroscopy of right shoulder-05/10/15 05/10/2015  . CKD (chronic kidney disease) stage 5, GFR less than 15 ml/min (HCC) 09/21/2014  . Unspecified asthma(493.90) 10/25/2013  . Acute combined systolic and diastolic heart failure (HCC) 09/21/2013  . Non-ischemic cardiomyopathy - by echo 8/16- EF 35-40% 09/16/2013  . Morbid obesity-  09/16/2013  . 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) 09/15/2013  . Normocytic anemia 09/15/2013  . Lower extremity edema 09/15/2013  . Hyperlipidemia    . Hypertension    Past Medical History:  Diagnosis Date  . Anemia   . Arthritis    "left hand" (09/15/2013)  . Asthma   . CHF (congestive heart failure) (HCC)   . Chronic bronchitis (HCC)    "just about q yr" (09/15/2013)  . Chronic kidney disease    "low kidney function" (09/15/2013) ,  T/Th/Sa  . COPD (chronic obstructive pulmonary disease) (HCC)   . Coronary artery disease   . Hyperlipidemia   . Hypertension   . Migraine    "get them alot" (09/15/2013)  . Myocardial infarction [facility]) 04/2015   NSTEMI  . Normal coronary arteries    by cardiac catheterization 09/20/13  . Peripheral vascular disease (HCC)   . Pneumonia    "couple times; have it now" (09/15/2013)  . PONV (postoperative nausea and vomiting)   . Restless legs   . Shortness of breath    "just recently; related to the pneumonia" (09/15/2013)  . Type 1 diabetes mellitus (HCC)    type 2    Family History  Problem Relation Age of Onset  . Diabetes Mother   . Stroke Mother   . Hypertension Father   . Hyperlipidemia Father   . Cancer - Lung Father   . Diabetes Maternal Grandmother   . Cancer Paternal Grandmother     Past Surgical History:  Procedure Laterality Date  . AMPUTATION Right 06/25/2016   Procedure: Amputation Right Great Toe at the Metatarsophalangeal Joint;  Surgeon: Nadara Mustard, MD;  Location: [facility] OR;  Service: Orthopedics;  Laterality: Right;  . AMPUTATION Bilateral 10/08/2016   Procedure: Bilateral Transmetatarsal Amputation;  Surgeon: Nadara Mustard, MD;  Location: [facility] OR;  Service: Orthopedics;  Laterality: Bilateral;  . AV FISTULA PLACEMENT Left 03/27/2015   Procedure: CREATION RADIAL CEPHALIC ARTERIOVENOUS FISTULA;  Surgeon: Chuck Hint, MD;  Location[facility] OR;  Service: Vascular;  Laterality: Left;  . AV FISTULA PLACEMENT Left 11/23/2015   Procedure:  LEFT ARM BASILIC VEIN TRANSPOSITION  ;  Surgeon: Chuck Hint, MD;  Location: [facility] OR;  Service: Vascular;  Laterality: Left;  .  CESAREAN SECTION  1999; 2006  . CORONARY ANGIOGRAM  09/20/2013   Procedure: CORONARY ANGIOGRAM;  Surgeon: Runell Gess, MD;  Location: [facility] CATH LAB;  Service: Cardiovascular;;  . FINGER SURGERY Left 1985   3rd and 4th digits reconstructed after cut off" (09/15/2013)  . PERIPHERAL VASCULAR CATHETERIZATION N/A 09/05/2016   Procedure: Abdominal Aortogram;  Surgeon: Sherren Kerns, MD;  Location: [facility] INVASIVE CV LAB;  Service: Cardiovascular;  Laterality: N/A;  . PERIPHERAL VASCULAR CATHETERIZATION Bilateral 09/05/2016   Procedure: Lower Extremity Angiography;  Surgeon: Sherren Kerns, MD;  Location: [facility] INVASIVE CV LAB;  Service: Cardiovascular;  Laterality: Bilateral;  . PERIPHERAL VASCULAR CATHETERIZATION Right 09/05/2016   Procedure: Peripheral Vascular Balloon Angioplasty;  Surgeon: Sherren Kerns, MD;  Location: [facility] INVASIVE CV LAB;  Service: Cardiovascular;  Laterality: Right;  peroneal and AT  . SHOULDER ARTHROSCOPY WITH  BICEPSTENOTOMY Right 05/10/2015   Procedure: RIGHT SHOULDER ARTHROSCOPY WITH BICEPS TENOTOMY, DEBRIDEMENT LABRAL TEAR;  Surgeon: Jones Broom, MD;  Location: [facility] OR;  Service: Orthopedics;  Laterality: Right;  Right shoulder arthroscopy biceps tenotomy, debridement labral tear  . STUMP REVISION Left 01/21/2017   Procedure: . Revision Left Transmetatarsal Amputation;  Surgeon: Nadara Mustard, MD;  Location: [facility] OR;  Service: Orthopedics;  Laterality: Left;  . TONSILLECTOMY  1997  . TUBAL LIGATION  2006   Social History   Occupational History  . Student    Social History Main Topics  . Smoking status: Never Smoker  . Smokeless tobacco: Never Used  . Alcohol use No  . Drug use: No  . Sexual activity: Not Currently    Partners: Male    Birth control/ protection: Other-see comments     Comment: S/P tubal ligation

## 2017-01-28 NOTE — Telephone Encounter (Signed)
Faxed over last office dictation and and advised that the pt had revision surgery on 01/21/17 so that [facility] will extend the use of her wheelchair

## 2017-01-28 NOTE — Telephone Encounter (Signed)
Patient is wanting to get an order for the home health nurse to change the dressing on her wounds.  CB#[phone].  Thank you.

## 2017-01-29 ENCOUNTER — Ambulatory Visit (INDEPENDENT_AMBULATORY_CARE_PROVIDER_SITE_OTHER): Payer: Medicaid Other | Admitting: Orthopedic Surgery

## 2017-01-29 ENCOUNTER — Telehealth (INDEPENDENT_AMBULATORY_CARE_PROVIDER_SITE_OTHER): Payer: Self-pay | Admitting: Radiology

## 2017-01-29 ENCOUNTER — Encounter: Payer: Medicaid Other | Admitting: Vascular Surgery

## 2017-01-29 DIAGNOSIS — Z89432 Acquired absence of left foot: Secondary | ICD-10-CM

## 2017-01-29 NOTE — Telephone Encounter (Signed)
Patient requests having dressing supplies sent to home. Can you please set this up?

## 2017-01-29 NOTE — Telephone Encounter (Signed)
I called and advised pt that home health care supplies are not covered with surgical wounds. If it was a wound with measurements it would be something we could do. Home health will not come out for dry dressing change. There is not a skilled need for nursing. Pt can apply gauze and an ace bandage to the surgical site daily.

## 2017-01-29 NOTE — Progress Notes (Signed)
Office Visit Note   Patient: [name]           Date of Birth: 1981/02/28           MRN: [id number] Visit Date: 01/29/2017              Requested by: [name], FNP [address] [place], [place] [address] PCP: [name], FNP  No chief complaint on file.     HPI: Patient was seen yesterday she states that there was bleeding through the dressing was concerned there may be a problem.  Assessment & Plan: Visit Diagnoses:  1. Status post transmetatarsal amputation of left foot (HCC)     Plan: We will try to set her up with dressing supplies to be delivered to home she will continue dry dressing changes as needed continue with washing with soap and water the importance of elevating her foot was discussed.  Follow-Up Instructions: Return in about 2 weeks (around 02/12/2017).   Ortho Exam  Patient is alert, oriented, no adenopathy, well-dressed, normal affect, normal respiratory effort. Examination of wound edges are well approximated there is a very small amount of serosanguineous drainage there is no cellulitis no odor no signs of infection. Patient's leg is dependent. He points of elevation was discussed.  Imaging: No results found.  Labs: Lab Results  Component Value Date   HGBA1C 5.2 10/08/2016   HGBA1C 5.1 06/20/2016   HGBA1C 7.4 (H) 03/19/2015   ESRSEDRATE 38 (H) 09/27/2016   ESRSEDRATE 28 (H) 06/23/2016   ESRSEDRATE 28 (H) 06/20/2016   CRP 0.6 06/23/2016   CRP 0.8 06/20/2016   REPTSTATUS 09/26/2016 FINAL 09/21/2016   GRAMSTAIN  09/21/2014    RARE WBC NO SQUAMOUS EPITHELIAL CELLS SEEN FEW GRAM POSITIVE COCCI IN CLUSTERS Performed at [facility]    CULT NO GROWTH 5 DAYS 09/21/2016   LABORGA METHICILLIN RESISTANT STAPHYLOCOCCUS AUREUS 09/21/2014    Orders:  No orders of the defined types were placed in this encounter.  No orders of the defined types were placed in this encounter.    Procedures: No procedures  performed  Clinical Data: No additional findings.  ROS:  All other systems negative, except as noted in the HPI. Review of Systems  Objective: Vital Signs: LMP 01/03/2017 (Exact Date)   Specialty Comments:  No specialty comments available.  PMFS History: Patient Active Problem List   Diagnosis Date Noted  . Dehiscence of amputation stump (HCC)   . Status post transmetatarsal amputation of left foot (HCC) 10/21/2016  . Status post transmetatarsal amputation of right foot (HCC) 10/08/2016  . 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 Diabetic osteomyelitis (HCC)   . 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 Diabetic foot infection (HCC) 06/20/2016  . ESRD (end stage renal disease) on dialysis (HCC) 06/20/2016  . Diabetic foot ulcer (HCC) 06/20/2016  . Pre-operative clearance 10/12/2015  . Normal coronary arteries 10/12/2015  . NSTEMI- type 2, Troponin 11.2 05/11/2015  . Respiratory failure requiring intubation (HCC) 05/10/2015  . History of arthroscopy of right shoulder-05/10/15 05/10/2015  . CKD (chronic kidney disease) stage 5, GFR less than 15 ml/min (HCC) 09/21/2014  . Unspecified asthma(493.90) 10/25/2013  . Acute combined systolic and diastolic heart failure (HCC) 09/21/2013  . Non-ischemic cardiomyopathy - by echo 8/16- EF 35-40% 09/16/2013  . Morbid obesity-  09/16/2013  . 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) 09/15/2013  . Normocytic anemia 09/15/2013  .  Lower extremity edema 09/15/2013  . Hyperlipidemia   . Hypertension    Past Medical History:  Diagnosis Date  . Anemia   . Arthritis    "left hand" (09/15/2013)  . Asthma   . CHF (congestive heart failure) (HCC)   . Chronic bronchitis (HCC)    "just about q yr" (09/15/2013)  . Chronic kidney disease    "low kidney function" (09/15/2013) ,  T/Th/Sa  . COPD (chronic obstructive pulmonary disease) (HCC)   . Coronary artery  disease   . Hyperlipidemia   . Hypertension   . Migraine    "get them alot" (09/15/2013)  . Myocardial infarction [facility]) 04/2015   NSTEMI  . Normal coronary arteries    by cardiac catheterization 09/20/13  . Peripheral vascular disease (HCC)   . Pneumonia    "couple times; have it now" (09/15/2013)  . PONV (postoperative nausea and vomiting)   . Restless legs   . Shortness of breath    "just recently; related to the pneumonia" (09/15/2013)  . Type 1 diabetes mellitus (HCC)    type 2    Family History  Problem Relation Age of Onset  . Diabetes Mother   . Stroke Mother   . Hypertension Father   . Hyperlipidemia Father   . Cancer - Lung Father   . Diabetes Maternal Grandmother   . Cancer Paternal Grandmother     Past Surgical History:  Procedure Laterality Date  . AMPUTATION Right 06/25/2016   Procedure: Amputation Right Great Toe at the Metatarsophalangeal Joint;  Surgeon: Nadara Mustard, MD;  Location: [facility] OR;  Service: Orthopedics;  Laterality: Right;  . AMPUTATION Bilateral 10/08/2016   Procedure: Bilateral Transmetatarsal Amputation;  Surgeon: Nadara Mustard, MD;  Location: [facility] OR;  Service: Orthopedics;  Laterality: Bilateral;  . AV FISTULA PLACEMENT Left 03/27/2015   Procedure: CREATION RADIAL CEPHALIC ARTERIOVENOUS FISTULA;  Surgeon: Chuck Hint, MD;  Location: [facility] OR;  Service: Vascular;  Laterality: Left;  . AV FISTULA PLACEMENT Left 11/23/2015   Procedure:  LEFT ARM BASILIC VEIN TRANSPOSITION  ;  Surgeon: Chuck Hint, MD;  Location: [facility] OR;  Service: Vascular;  Laterality: Left;  . CESAREAN SECTION  1999; 2006  . CORONARY ANGIOGRAM  09/20/2013   Procedure: CORONARY ANGIOGRAM;  Surgeon: Runell Gess, MD;  Location: [facility] CATH LAB;  Service: Cardiovascular;;  . FINGER SURGERY Left 1985   3rd and 4th digits reconstructed after cut off" (09/15/2013)  . PERIPHERAL VASCULAR CATHETERIZATION N/A 09/05/2016   Procedure: Abdominal Aortogram;  Surgeon: Sherren Kerns, MD;  Location[facility] INVASIVE CV LAB;  Service: Cardiovascular;  Laterality: N/A;  . PERIPHERAL VASCULAR CATHETERIZATION Bilateral 09/05/2016   Procedure: Lower Extremity Angiography;  Surgeon: Sherren Kerns, MD;  Location: [facility] INVASIVE CV LAB;  Service: Cardiovascular;  Laterality: Bilateral;  . PERIPHERAL VASCULAR CATHETERIZATION Right 09/05/2016   Procedure: Peripheral Vascular Balloon Angioplasty;  Surgeon: Sherren Kerns, MD;  Location: [facility] INVASIVE CV LAB;  Service: Cardiovascular;  Laterality: Right;  peroneal and AT  . SHOULDER ARTHROSCOPY WITH BICEPSTENOTOMY Right 05/10/2015   Procedure: RIGHT SHOULDER ARTHROSCOPY WITH BICEPS TENOTOMY, DEBRIDEMENT LABRAL TEAR;  Surgeon: Jones Broom, MD;  Location: [facility] OR;  Service: Orthopedics;  Laterality: Right;  Right shoulder arthroscopy biceps tenotomy, debridement labral tear  . STUMP REVISION Left 01/21/2017   Procedure: . Revision Left Transmetatarsal Amputation;  Surgeon: Nadara Mustard, MD;  Location: [facility] OR;  Service: Orthopedics;  Laterality: Left;  . TONSILLECTOMY  1997  . TUBAL  LIGATION  2006   Social History   Occupational History  . Student    Social History Main Topics  . Smoking status: Never Smoker  . Smokeless tobacco: Never Used  . Alcohol use No  . Drug use: No  . Sexual activity: Not Currently    Partners: Male    Birth control/ protection: Other-see comments     Comment: S/P tubal ligation

## 2017-01-30 DIAGNOSIS — H81319 Aural vertigo, unspecified ear: Secondary | ICD-10-CM | POA: Insufficient documentation

## 2017-02-02 ENCOUNTER — Ambulatory Visit: Payer: Medicaid Other | Admitting: Surgery

## 2017-02-04 ENCOUNTER — Ambulatory Visit (INDEPENDENT_AMBULATORY_CARE_PROVIDER_SITE_OTHER): Payer: Medicaid Other | Admitting: Orthopedic Surgery

## 2017-02-06 ENCOUNTER — Ambulatory Visit (INDEPENDENT_AMBULATORY_CARE_PROVIDER_SITE_OTHER): Payer: Medicaid Other | Admitting: Orthopedic Surgery

## 2017-02-09 ENCOUNTER — Encounter: Payer: Medicaid Other | Attending: Surgery | Admitting: Surgery

## 2017-02-09 DIAGNOSIS — E10621 Type 1 diabetes mellitus with foot ulcer: Secondary | ICD-10-CM | POA: Insufficient documentation

## 2017-02-09 DIAGNOSIS — J449 Chronic obstructive pulmonary disease, unspecified: Secondary | ICD-10-CM | POA: Diagnosis not present

## 2017-02-09 DIAGNOSIS — L97522 Non-pressure chronic ulcer of other part of left foot with fat layer exposed: Secondary | ICD-10-CM | POA: Insufficient documentation

## 2017-02-09 DIAGNOSIS — T8744 Infection of amputation stump, left lower extremity: Secondary | ICD-10-CM | POA: Insufficient documentation

## 2017-02-09 DIAGNOSIS — G4730 Sleep apnea, unspecified: Secondary | ICD-10-CM | POA: Diagnosis not present

## 2017-02-09 DIAGNOSIS — I132 Hypertensive heart and chronic kidney disease with heart failure and with stage 5 chronic kidney disease, or end stage renal disease: Secondary | ICD-10-CM | POA: Diagnosis not present

## 2017-02-09 DIAGNOSIS — I509 Heart failure, unspecified: Secondary | ICD-10-CM | POA: Diagnosis not present

## 2017-02-09 DIAGNOSIS — T8131XA Disruption of external operation (surgical) wound, not elsewhere classified, initial encounter: Secondary | ICD-10-CM | POA: Insufficient documentation

## 2017-02-09 DIAGNOSIS — I429 Cardiomyopathy, unspecified: Secondary | ICD-10-CM | POA: Insufficient documentation

## 2017-02-09 DIAGNOSIS — D649 Anemia, unspecified: Secondary | ICD-10-CM | POA: Diagnosis not present

## 2017-02-09 DIAGNOSIS — Z992 Dependence on renal dialysis: Secondary | ICD-10-CM | POA: Insufficient documentation

## 2017-02-09 DIAGNOSIS — M19042 Primary osteoarthritis, left hand: Secondary | ICD-10-CM | POA: Insufficient documentation

## 2017-02-09 DIAGNOSIS — L89620 Pressure ulcer of left heel, unstageable: Secondary | ICD-10-CM | POA: Insufficient documentation

## 2017-02-09 DIAGNOSIS — I70245 Atherosclerosis of native arteries of left leg with ulceration of other part of foot: Secondary | ICD-10-CM | POA: Insufficient documentation

## 2017-02-09 DIAGNOSIS — Z6836 Body mass index (BMI) 36.0-36.9, adult: Secondary | ICD-10-CM | POA: Diagnosis not present

## 2017-02-09 DIAGNOSIS — N186 End stage renal disease: Secondary | ICD-10-CM | POA: Insufficient documentation

## 2017-02-09 DIAGNOSIS — Z79899 Other long term (current) drug therapy: Secondary | ICD-10-CM | POA: Insufficient documentation

## 2017-02-09 DIAGNOSIS — E1022 Type 1 diabetes mellitus with diabetic chronic kidney disease: Secondary | ICD-10-CM | POA: Diagnosis not present

## 2017-02-09 DIAGNOSIS — Z794 Long term (current) use of insulin: Secondary | ICD-10-CM | POA: Diagnosis not present

## 2017-02-09 DIAGNOSIS — E7800 Pure hypercholesterolemia, unspecified: Secondary | ICD-10-CM | POA: Insufficient documentation

## 2017-02-09 DIAGNOSIS — Y839 Surgical procedure, unspecified as the cause of abnormal reaction of the patient, or of later complication, without mention of misadventure at the time of the procedure: Secondary | ICD-10-CM | POA: Diagnosis not present

## 2017-02-09 NOTE — Progress Notes (Addendum)
ORLA, SUTCLIFFE ([id number]) Visit Report for 02/09/2017 Chief Complaint Document Details Patient Name: [name], [name] 02/09/2017 11:00 Date of Service: AM Medical Record [id number] Number: Patient Account Number: [id number] 1981/02/21 (35 y.o. Treating RN: Huel Coventry Date of Birth/Sex: Female) Other Clinician: Primary Care Provider: [name] Treating Danay Mckellar Referring Provider: [name] Provider/Extender: Tania Ade in Treatment: 6 Information Obtained from: Patient Chief Complaint Patients presents for treatment of an open diabetic ulcer to the left transmetatarsal amputation site and this has been there since 10/08/2016 and has a left lateral heel decubitus ulcer since November 2017 Electronic Signature(s) Signed: 02/09/2017 12:29:30 PM By: Evlyn Kanner MD, FACS Entered By: Evlyn Kanner on 02/09/2017 12:29:30 [name] ([id number]) -------------------------------------------------------------------------------- HPI Details Patient Name: [name], [name] 02/09/2017 11:00 Date of Service: AM Medical Record [id number] Number: Patient Account Number: [id number] 1981/02/23 (35 y.o. Treating RN: Huel Coventry Date of Birth/Sex: Female) Other Clinician: Primary Care Provider: [name] Treating Isabellamarie Randa Referring Provider: [name] Provider/Extender: Tania Ade in Treatment: 6 History of Present Illness Location: open wound right medial foot where recent amputation was done and skin ulcers on the left third and fourth toe Quality: Patient reports experiencing a sharp pain to affected area(s). Severity: Patient states wound are getting worse. Duration: Patient has had the wound for < 2 weeks prior to presenting for treatment Timing: Pain in wound is constant (hurts all the time) Context: The wound occurred when the patient had gangrene of the right first toe and was admitted to the hospital Modifying Factors: Other treatment(s) tried include:doxycycline and  Santyl ointment Associated Signs and Symptoms: Patient reports having foul odor. HPI Description: 36 year old patient who was seen by me on 2 different occasions in October/November 2017, has now come back to the wound center. 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 She also has a ulcer on the left heel which has been there since November 2017. Review of her electronic medical records revealed that she had a angiogram done by Dr. Fabienne Bruns on 09/05/2016 --he did an abdominal aortogram with bilateral lower extremity runoffs, angioplasty of the right anterior tibial artery, angioplasty of the right peroneal artery. 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 At that time she was also seen by infectious disease was started on IV vancomycin and oral ciprofloxacin and metronidazole. patient says at the present time she is on Flagyl as per Dr. [name] and has not been seeing infectious disease doctors. Her last hemoglobin A1c was 5.9. 01/16/17- she is here for follow-up evaluation of her left TMA site. She had a follow-up appointment with Dr. [name] on 4/18, the plan is for a surgical debridement versus revision. That has not been scheduled. She has an appointment with vascular medicine on 5/2. She has no appointment pending with infectious disease. She is currently on doxycycline for ear infection, not on Flagyl. She continues to use Santyl to her wounds. She had an A1c obtained in dialysis last week, result 5.2. She had an x-ray of the left foot on 4/2 which revealed no lytic destruction seen to suggest acute osteoarthritis; soft tissue defects are seen distal to first and fifth  residual metatarsals which may represent unhealed surgical wounds or ulcerations. 02/09/2017 -- she returns after almost a month  and was taken up for surgical debridement on 01/21/2017 by Dr. [name] for revision of the left transmetatarsal amputation. He did a revision of this surgery, and placed a Prevena wound VAC postoperatively. She was last seen by him in the office on 01/29/2017 -- wound edges were well approximated and there Stucker, Shanecia ([id number]) was no cellulitis or infection. he is to follow-up with her next week. 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 She also is on hemodialysis Recently seen by Dr. Aldean Baker on 07/22/2016 a right great toe open wound and he had treated her for this with nitroglycerin patch changes daily and dressing for the foot with nonweightbearing and has asked her to return in 2 weeks. he had also put her on central dressings and doxycycline was prescribed for 2 weeks past surgical history is significant for amputation of the right great toe at the MTP joint on 06/25/2016 by Dr. [name]. Also status AV fistula placement, cesarean section, coronary angiogram, left third and fourth digit reconstruction surgery of her fingers, shoulder arthroscopy and tonsillectomy. She has never been a smoker. her MRI done on 06/22/2016 showed -- IMPRESSION: Ulceration on the distal great toe. Mild marrow edema in the tuft of the distal phalanx is worrisome for osteomyelitis. Negative for abscess or septic joint. as per Dr. Audrie Lia notes she had an amputation of the right great toe and  partial amputation of the first metatarsal including the sesamoids as she had a diagnosis of gangrene, osteomyelitis and had failed conservative measures and elected for surgical management. Arterial duplex studies done on 06/21/2016 showed bilateral noncompressible arteries with the right pedal artery waveform appears abnormal with monophasic flow and the left lower extremity flow was triphasic. hemoglobin A1c on September 22 was 5.1% O St. recently was 4.6% a week ago. 08/08/2016 -- the patient has seen Dr. [name] who did not recommend any further surgical intervention and asked her to continue with local care and see him back in 2 weeks. She has not seen the infectious disease doctor. She has not got her x-rays done. The patient continues to be his most nonchalant about her care and she does not seem to be very compliant. ======= Electronic Signature(s) Signed: 02/09/2017 12:30:57 PM By: Evlyn Kanner MD, FACS Previous Signature: 02/09/2017 12:14:13 PM Version By: Evlyn Kanner MD, FACS Entered By: Evlyn Kanner on 02/09/2017 12:30:57 [name] ([id number]) -------------------------------------------------------------------------------- Physical Exam Details Patient Name: [name], [name] 02/09/2017 11:00 Date of Service: AM Medical Record [id number] Number: Patient Account Number: [id number] 05-09-1981 (35 y.o. Treating RN: Huel Coventry Date of Birth/Sex: Female) Other Clinician: Primary Care Provider: [name] Treating Evlyn Kanner Referring Provider: [name] Provider/Extender: Tania Ade in Treatment: 6 Constitutional . Pulse regular. Respirations normal and unlabored. Afebrile. . Eyes Nonicteric. Reactive to light. Ears, Nose, Mouth, and Throat Lips, teeth, and gums WNL.Marland Kitchen Moist mucosa without lesions. Neck supple and nontender. No palpable supraclavicular or cervical adenopathy. Normal sized without goiter. Respiratory WNL. No retractions.. Cardiovascular Pedal Pulses  WNL. No clubbing, cyanosis or edema. Lymphatic No adneopathy. No adenopathy. No adenopathy. Musculoskeletal Adexa without tenderness or enlargement.. Digits and nails w/o clubbing, cyanosis, infection, petechiae, ischemia, or inflammatory conditions.. Integumentary (Hair,  Skin) No suspicious lesions. No crepitus or fluctuance. No peri-wound warmth or erythema. No masses.Marland Kitchen Psychiatric Judgement and insight Intact.. No evidence of depression, anxiety, or agitation.. Notes in the left foot transmetatarsal amputation site is still with sutures and there is minimal dehiscence of the wound but no evidence of purulent drainage or cellulitis. The posterior lateral heel dry eschar is still intact. Electronic Signature(s) Signed: 02/09/2017 12:31:55 PM By: Evlyn Kanner MD, FACS Entered By: Evlyn Kanner on 02/09/2017 12:31:54 [name] ([id number]) -------------------------------------------------------------------------------- Physician Orders Details Patient Name: THRESIA, RAMANATHAN 02/09/2017 11:00 Date of Service: AM Medical Record [id number] Number: Patient Account Number: [id number] 1980-10-26 (35 y.o. Treating RN: Huel Coventry Date of Birth/Sex: Female) Other Clinician: Primary Care Provider: [name] Treating Geovanni Rahming Referring Provider: [name] Provider/Extender: Tania Ade in Treatment: 6 Verbal / Phone Orders: No Diagnosis Coding Wound Cleansing Wound #4 Left Amputation Site - Transmetatarsal o Cleanse wound with mild soap and water Wound #5 Left Malleolus o Cleanse wound with mild soap and water Primary Wound Dressing Wound #4 Left Amputation Site - Transmetatarsal o petroleum gauze Wound #5 Left Malleolus o Other: - betadine paint Secondary Dressing Wound #4 Left Amputation Site - Transmetatarsal o ABD and Kerlix/Conform Wound #5 Left Malleolus o ABD and Kerlix/Conform Dressing Change Frequency Wound #4 Left Amputation Site - Transmetatarsal o  Change dressing every day. Wound #5 Left Malleolus o Change dressing every day. Follow-up Appointments Wound #4 Left Amputation Site - Transmetatarsal o Return Appointment in 1 week. Wound #5 Left Malleolus o Return Appointment in 1 week. WANETA, FITTING ([id number]) Off-Loading Wound #5 Left Malleolus o Other: - Keep pressure off Notes Patient is still under care of Dr. Leone Haven. Electronic Signature(s) Signed: 02/09/2017 2:37:11 PM By: Elliot Gurney RN, BSN, Kim RN, BSN Signed: 02/09/2017 4:48:28 PM By: Evlyn Kanner MD, FACS Entered By: Elliot Gurney RN, BSN, Kim on 02/09/2017 12:30:27 [name] ([id number]) -------------------------------------------------------------------------------- Problem List Details Patient Name: NIMO, VERASTEGUI 02/09/2017 11:00 Date of Service: AM Medical Record [id number] Number: Patient Account Number: [id number] 08/11/1981 (35 y.o. 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/29/2016 Yes layer exposed T87.44 Infection of amputation stump, left lower extremity 12/29/2016 Yes Z99.2 Dependence on renal dialysis 12/29/2016 Yes T81.31XA Disruption of external operation (surgical) wound, not 12/29/2016 Yes elsewhere classified, initial encounter 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, unstageable 12/29/2016 Yes Inactive Problems Resolved Problems Electronic Signature(s) Signed: 02/09/2017 12:29:11 PM By: Evlyn Kanner MD, FACS Entered By: Evlyn Kanner on 02/09/2017 12:29:10 [name] ([name] ([id number]) -------------------------------------------------------------------------------- Progress Note Details Patient Name: ALIN, HUTCHINS 02/09/2017 11:00 Date of Service: AM Medical Record [id number] Number: Patient Account Number: [id number] 04-Apr-1981 (35 y.o. Treating RN: Huel Coventry Date of Birth/Sex: Female) Other Clinician: Primary Care Provider: [name] Treating Ervie Mccard Referring Provider: [name] Provider/Extender: Tania Ade in Treatment: 6 Subjective Chief Complaint Information obtained from Patient Patients presents for treatment of an open diabetic ulcer to the left transmetatarsal amputation site and this has been there since 10/08/2016 and has a left lateral heel decubitus ulcer since November 2017 History of Present Illness (HPI) The following HPI elements were documented for  the patient's wound: Location: open wound right medial foot where recent amputation was done and skin ulcers on the left third and fourth toe Quality: Patient reports experiencing a sharp pain to affected area(s). Severity: Patient states wound are getting worse. Duration: Patient has had the wound for < 2 weeks prior to presenting for treatment Timing: Pain in wound is constant (hurts all the time) Context: The wound occurred when the patient had gangrene of the right first toe and was admitted to the hospital Modifying Factors: Other treatment(s) tried include:doxycycline and Santyl ointment Associated Signs and Symptoms: Patient reports having foul odor. 36 year old patient who was seen by me on 2 different occasions in October/November 2017, has now come back to the wound center. 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 She also has a ulcer on the left heel which has been there since November 2017. Review of her electronic medical records revealed that she had a angiogram done by Dr. Fabienne Bruns on 09/05/2016 --he did an abdominal aortogram with bilateral lower extremity runoffs, angioplasty of the right anterior tibial artery, angioplasty of the right peroneal artery. 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 At that time she was also seen by infectious disease was started on IV vancomycin and oral ciprofloxacin and metronidazole. patient says at the present time she is on Flagyl as per Dr. [name] and has not been seeing infectious disease doctors. Her last hemoglobin A1c was 5.9. 01/16/17- she is here for follow-up evaluation of her left TMA site. She had a follow-up appointment with Dr. [name] on 4/18, the plan is for a surgical debridement versus revision. That has not been scheduled. She has an appointment with vascular medicine on 5/2. She has no appointment pending with infectious disease. HAZLEY, DEZEEUW ([id number]) She is currently on doxycycline for ear infection, not on Flagyl. She continues to use Santyl to her wounds. She had an A1c obtained in dialysis last week, result 5.2. She had an x-ray of the left foot on 4/2 which revealed no lytic destruction seen to suggest acute osteoarthritis; soft tissue defects are seen distal to first and fifth residual metatarsals which may represent unhealed surgical wounds or ulcerations. 02/09/2017 -- she returns after almost a month and was taken up for surgical debridement on 01/21/2017 by Dr. [name] for revision of the left transmetatarsal amputation. He did a revision of this surgery, and placed a Prevena wound VAC postoperatively. She was last seen by him in the office on 01/29/2017 -- wound edges were well approximated and there was no cellulitis or infection. he is to follow-up with  her next week. 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 She also is on hemodialysis Recently seen by Dr. Aldean Baker on 07/22/2016 a right great toe open wound and he had treated her for this with nitroglycerin patch changes daily and dressing for the foot with nonweightbearing and has asked  her to return in 2 weeks. he had also put her on central dressings and doxycycline was prescribed for 2 weeks past surgical history is significant for amputation of the right great toe at the MTP joint on 06/25/2016 by Dr. [name]. Also status AV fistula placement, cesarean section, coronary angiogram, left third and fourth digit reconstruction surgery of her fingers, shoulder arthroscopy and tonsillectomy. She has never been a smoker. her MRI done on 06/22/2016 showed -- IMPRESSION: Ulceration on the distal great toe. Mild marrow edema in the tuft of the distal phalanx is worrisome for osteomyelitis. Negative for abscess or septic joint. as per Dr. Audrie Lia notes she had an amputation of the right great toe and partial amputation of the first metatarsal including the sesamoids as she had a diagnosis of gangrene, osteomyelitis and had failed conservative measures and elected for surgical management. Arterial duplex studies done on 06/21/2016 showed bilateral noncompressible arteries with the right pedal artery waveform appears abnormal with monophasic flow and the left lower extremity flow was triphasic. hemoglobin A1c on September 22 was 5.1% O St. recently was 4.6% a week ago. 08/08/2016 -- the patient has seen  Dr. [name] who did not recommend any further surgical intervention and asked her to continue with local care and see him back in 2 weeks. She has not seen the infectious disease doctor. She has not got her x-rays done. The patient continues to be his most nonchalant about her care and she does not seem to be very compliant. ======= Objective Constitutional Breit, Charniece ([id number]) Pulse regular. Respirations normal and unlabored. Afebrile. Vitals Time Taken: 12:11 PM, Height: 68 in, Weight: 240 lbs, BMI: 36.5, Temperature: 97.7 F, Pulse: 75 bpm, Respiratory Rate: 16 breaths/min, Blood Pressure: 114/94 mmHg. Eyes Nonicteric. Reactive to light. Ears, Nose, Mouth, and Throat Lips, teeth, and gums WNL.Marland Kitchen Moist mucosa without lesions. Neck supple and nontender. No palpable supraclavicular or cervical adenopathy. Normal sized without goiter. Respiratory WNL. No retractions.. Cardiovascular Pedal Pulses WNL. No clubbing, cyanosis or edema. Lymphatic No adneopathy. No adenopathy. No adenopathy. Musculoskeletal Adexa without tenderness or enlargement.. Digits and nails w/o clubbing, cyanosis, infection, petechiae, ischemia, or inflammatory conditions.Marland Kitchen Psychiatric Judgement and insight Intact.. No evidence of depression, anxiety, or agitation.. General Notes: in the left foot transmetatarsal amputation site is still with sutures and there is minimal dehiscence of the wound but no evidence of purulent drainage or cellulitis. The posterior lateral heel dry eschar is still intact. Integumentary (Hair, Skin) No suspicious lesions. No crepitus or fluctuance. No peri-wound warmth or erythema. No masses.. Wound #4 status is Open. Original cause of wound was Surgical Injury. The wound is located on the Left Amputation Site - Transmetatarsal. The wound measures 0.5cm length x 13.5cm width x 0.1cm depth; 5.301cm^2 area and 0.53cm^3 volume. The wound is limited to skin breakdown. There is a medium  amount of serous drainage noted. The wound margin is flat and intact. There is no granulation within the wound bed. There is no necrotic tissue within the wound bed. The periwound skin appearance exhibited: Callus, Crepitus, Excoriation, Induration, Rash, Scarring, Dry/Scaly, Maceration, Atrophie Blanche, Cyanosis, Ecchymosis, Hemosiderin Staining, Mottled, Pallor, Rubor, Erythema. The surrounding wound skin color is noted with erythema. The periwound has tenderness on palpation. General Notes: Patient had surgical debridement and revision of wound on 4/25, 2018 by Dr. Leone Haven. Wound #5 status is Open. Original cause of wound was Gradually Appeared. The wound is located on the Left Malleolus. The wound measures 2.4cm length x 2.1cm width x 0.1cm  depth; 3.958cm^2 area and Lyster, Beatrix ([id number]) 0.396cm^3 volume. The wound is limited to skin breakdown. There is no tunneling or undermining noted. There is a none present amount of drainage noted. The wound margin is flat and intact. There is a large (67-100%) amount of necrotic tissue within the wound bed including Eschar. The periwound skin appearance exhibited: Dry/Scaly. Periwound temperature was noted as No Abnormality. The periwound has tenderness on palpation. Wound #6 status is Converted. Original cause of wound was Surgical Injury. The wound is located on the Left,Lateral Amputation Site - Transmetatarsal. Assessment Active Problems ICD-10 E10.621 - Type 1 diabetes mellitus with foot ulcer I70.245 - Atherosclerosis of native arteries of left leg with ulceration of other part of foot L97.522 - Non-pressure chronic ulcer of other part of left foot with fat layer exposed T87.44 - Infection of amputation stump, left lower extremity Z99.2 - Dependence on renal dialysis T81.31XA - Disruption of external operation (surgical) wound, not elsewhere classified, initial encounter L89.620 - Pressure ulcer of left heel, unstageable Plan Wound  Cleansing: Wound #4 Left Amputation Site - Transmetatarsal: Cleanse wound with mild soap and water Wound #5 Left Malleolus: Cleanse wound with mild soap and water Primary Wound Dressing: Wound #4 Left Amputation Site - Transmetatarsal: petroleum gauze Wound #5 Left Malleolus: Other: - betadine paint Secondary Dressing: Wound #4 Left Amputation Site - Transmetatarsal: ABD and Kerlix/Conform Wound #5 Left Malleolus: ABD and Kerlix/Conform Dressing Change Frequency: Wound #4 Left Amputation Site - Transmetatarsal: Change dressing every day. ROXIE, KREEGER ([id number]) Wound #5 Left Malleolus: Change dressing every day. Follow-up Appointments: Wound #4 Left Amputation Site - Transmetatarsal: Return Appointment in 1 week. Wound #5 Left Malleolus: Return Appointment in 1 week. Off-Loading: Wound #5 Left Malleolus: Other: - Keep pressure off General Notes: Patient is still under care of Dr. Leone Haven. This 36 year old, has type 1 diabetes mellitus with arteriosclerosis of her lower extremity blood vessels bilaterally and also had recent surgery with a revision of the amputation stump at the left transmetatarsal amputation site. She is still under the care of the orthopedic surgeon Dr. [name] and I have asked her to see him in follow-up. After review I have also recommended: 1. Vascular surgery consult with Dr. Darrick Penna, for possible surgical options to the left lower extremity 2. Continue with local care as per Ortho 3. After complete optimization if she continues to have problems with her diabetic foot ulcer she may benefit from return to our wound clinic. Electronic Signature(s) Signed: 02/09/2017 12:33:56 PM By: Evlyn Kanner MD, FACS Entered By: Evlyn Kanner on 02/09/2017 12:33:56 [name] ([id number]) -------------------------------------------------------------------------------- SuperBill Details Patient Name: [name] Date of Service: 02/09/2017 Medical Record Number:  [id number] Patient Account Number: [id number] Date of Birth/Sex: 09/11/1981 (35 y.o. Female) Treating RN: Huel Coventry Primary Care Provider: [name] Other Clinician: Referring Provider: [name] Treating Provider/Extender: Rudene Re in Treatment: 6 Diagnosis Coding ICD-10 Codes Code Description E10.621 Type 1 diabetes mellitus with foot ulcer I70.245 Atherosclerosis of native arteries of left leg with ulceration of other part of foot L97.522 Non-pressure chronic ulcer of other part of left foot with fat layer exposed T87.44 Infection of amputation stump, left lower extremity Z99.2 Dependence on renal dialysis Disruption of external operation (surgical) wound, not elsewhere classified, initial T81.31XA encounter 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, unstageable Facility Procedures CPT4 Code: [id number] Description: 99213 - WOUND CARE VISIT-LEV 3 EST PT Modifier: Quantity: 1 Physician Procedures CPT4: Description Modifier Quantity Code 4034742 99213 - WC PHYS LEVEL 3 -  EST PT 1 ICD-10 Description Diagnosis E10.621 Type 1 diabetes mellitus with foot ulcer I70.245 Atherosclerosis of native arteries of left leg with ulceration of other part of foot  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, unstageable T81.31XA Disruption of external operation (surgical) wound, not elsewhere classified, initial encounter Electronic Signature(s) Signed: 02/09/2017 12:34:18 PM By: Evlyn Kanner MD, FACS Entered By: Evlyn Kanner on 02/09/2017 12:34:17

## 2017-02-10 ENCOUNTER — Encounter: Payer: Self-pay | Admitting: Vascular Surgery

## 2017-02-10 NOTE — Progress Notes (Addendum)
NISHAT, LIVINGSTON ([id number]) Visit Report for 02/09/2017 Arrival Information Details Patient Name: [name], [name] Date of Service: 02/09/2017 11:00 AM Medical Record Number: [id number] Patient Account Number: [id number] Date of Birth/Sex: 1981/03/22 (36 y.o. Female) Treating RN: Huel Coventry Primary Care Tayleigh Wetherell: [name] Other Clinician: Referring Nain Rudd: [name] Treating Cyndee Giammarco/Extender: Rudene Re in Treatment: 6 Visit Information History Since Last Visit Added or deleted any medications: No Patient Arrived: Wheel Chair Any new allergies or adverse reactions: No Arrival Time: 12:08 Had a fall or experienced change in No Accompanied By: self activities of daily living that may affect Transfer Assistance: None risk of falls: Patient Identification Verified: Yes Signs or symptoms of abuse/neglect since last No Secondary Verification Process Yes visito Completed: Hospitalized since last visit: No Patient Has Alerts: Yes Pain Present Now: Yes Patient Alerts: Patient on Blood Thinner Electronic Signature(s) Signed: 02/09/2017 2:37:11 PM By: Elliot Gurney, RN, BSN, Kim RN, BSN Entered By: Elliot Gurney, RN, BSN, Kim on 02/09/2017 12:10:48 [name] ([id number]) -------------------------------------------------------------------------------- Clinic Level of Care Assessment Details Patient Name: [name] Date of Service: 02/09/2017 11:00 AM Medical Record Number: [id number] Patient Account Number: [id number] Date of Birth/Sex: 1981-02-15 (36 y.o. Female) Treating RN: Huel Coventry Primary Care Jemarion Roycroft: [name] Other Clinician: Referring Jasneet Schobert: [name] Treating Eleny Cortez/Extender: Rudene Re in Treatment: 6 Clinic Level of Care Assessment Items TOOL 4 Quantity Score []  - Use when only an EandM is performed on FOLLOW-UP visit 0 ASSESSMENTS - Nursing Assessment / Reassessment []  - Reassessment of Co-morbidities (includes updates in patient status)  0 X - Reassessment of Adherence to Treatment Plan 1 5 ASSESSMENTS - Wound and Skin Assessment / Reassessment []  - Simple Wound Assessment / Reassessment - one wound 0 X - Complex Wound Assessment / Reassessment - multiple wounds 1 5 []  - Dermatologic / Skin Assessment (not related to wound area) 0 ASSESSMENTS - Focused Assessment []  - Circumferential Edema Measurements - multi extremities 0 []  - Nutritional Assessment / Counseling / Intervention 0 []  - Lower Extremity Assessment (monofilament, tuning fork, pulses) 0 []  - Peripheral Arterial Disease Assessment (using hand held doppler) 0 ASSESSMENTS - Ostomy and/or Continence Assessment and Care []  - Incontinence Assessment and Management 0 []  - Ostomy Care Assessment and Management (repouching, etc.) 0 PROCESS - Coordination of Care X - Simple Patient / Family Education for ongoing care 1 15 []  - Complex (extensive) Patient / Family Education for ongoing care 0 []  - Staff obtains Chiropractor, Records, Test Results / Process Orders 0 []  - Staff telephones HHA, Nursing Homes / Clarify orders / etc 0 []  - Routine Transfer to another Facility (non-emergent condition) 0 ASIAH, BROWDER ([id number]) []  - Routine Hospital Admission (non-emergent condition) 0 []  - New Admissions / Manufacturing engineer / Ordering NPWT, Apligraf, etc. 0 []  - Emergency Hospital Admission (emergent condition) 0 X - Simple Discharge Coordination 1 10 []  - Complex (extensive) Discharge Coordination 0 PROCESS - Special Needs []  - Pediatric / Minor Patient Management 0 []  - Isolation Patient Management 0 []  - Hearing / Language / Visual special needs 0 []  - Assessment of Community assistance (transportation, D/C planning, etc.) 0 []  - Additional assistance / Altered mentation 0 []  - Support Surface(s) Assessment (bed, cushion, seat, etc.) 0 INTERVENTIONS - Wound Cleansing / Measurement []  - Simple Wound Cleansing - one wound 0 X - Complex Wound Cleansing - multiple  wounds 2 5 []  - Wound Imaging (photographs - any number of wounds) 0 X - Wound Tracing (instead of photographs) 1 5 []  -  Simple Wound Measurement - one wound 0 X - Complex Wound Measurement - multiple wounds 2 5 INTERVENTIONS - Wound Dressings []  - Small Wound Dressing one or multiple wounds 0 X - Medium Wound Dressing one or multiple wounds 1 15 []  - Large Wound Dressing one or multiple wounds 0 []  - Application of Medications - topical 0 []  - Application of Medications - injection 0 INTERVENTIONS - Miscellaneous []  - External ear exam 0 ADAMARIE, IZZO ([id number]) []  - Specimen Collection (cultures, biopsies, blood, body fluids, etc.) 0 []  - Specimen(s) / Culture(s) sent or taken to Lab for analysis 0 []  - Patient Transfer (multiple staff / Michiel Sites Lift / Similar devices) 0 []  - Simple Staple / Suture removal (25 or less) 0 []  - Complex Staple / Suture removal (26 or more) 0 []  - Hypo / Hyperglycemic Management (close monitor of Blood Glucose) 0 []  - Ankle / Brachial Index (ABI) - do not check if billed separately 0 X - Vital Signs 1 5 Has the patient been seen at the hospital within the last three years: Yes Total Score: 80 Level Of Care: New/Established - Level 3 Electronic Signature(s) Signed: 02/09/2017 2:37:11 PM By: Elliot Gurney, RN, BSN, Kim RN, BSN Entered By: Elliot Gurney, RN, BSN, Kim on 02/09/2017 12:31:13 [name] ([id number]) -------------------------------------------------------------------------------- Encounter Discharge Information Details Patient Name: [name] Date of Service: 02/09/2017 11:00 AM Medical Record Number: [id number] Patient Account Number: [id number] Date of Birth/Sex: Feb 09, 1981 (36 y.o. Female) Treating RN: Huel Coventry Primary Care Porche Steinberger: [name] Other Clinician: Referring Arley Salamone: [name] Treating Ziyan Schoon/Extender: Rudene Re in Treatment: 6 Encounter Discharge Information Items Discharge Pain Level: 0 Discharge Condition:  Stable Ambulatory Status: Wheelchair Discharge Destination: Home Transportation: Private Auto Accompanied By: friend Schedule Follow-up Appointment: Yes Medication Reconciliation completed Yes and provided to Patient/Care Cheyenne Bordeaux: Patient Clinical Summary of Care: Declined Electronic Signature(s) Signed: 02/09/2017 12:48:39 PM By: Gwenlyn Perking Entered By: Gwenlyn Perking on 02/09/2017 12:48:39 [name] ([id number]) -------------------------------------------------------------------------------- Lower Extremity Assessment Details Patient Name: [name] Date of Service: 02/09/2017 11:00 AM Medical Record Number: [id number] Patient Account Number: [id number] Date of Birth/Sex: 09-25-1981 (36 y.o. Female) Treating RN: Huel Coventry Primary Care Yashas Camilli: [name] Other Clinician: Referring Daquawn Seelman: [name] Treating Coston Mandato/Extender: Rudene Re in Treatment: 6 Vascular Assessment Pulses: Dorsalis Pedis Palpable: [Left:Yes] Posterior Tibial Palpable: [Left:Yes] Extremity colors, hair growth, and conditions: Extremity Color: [Left:Normal] Hair Growth on Extremity: [Left:No] Temperature of Extremity: [Left:Warm] Notes Transmet on left Electronic Signature(s) Signed: 02/09/2017 2:37:11 PM By: Elliot Gurney, RN, BSN, Kim RN, BSN Entered By: Elliot Gurney, RN, BSN, Kim on 02/09/2017 12:18:34 [name] ([id number]) -------------------------------------------------------------------------------- Multi Wound Chart Details Patient Name: [name] Date of Service: 02/09/2017 11:00 AM Medical Record Number: [id number] Patient Account Number: [id number] Date of Birth/Sex: 08-06-1981 (36 y.o. Female) Treating RN: Huel Coventry Primary Care Dynastee Brummell: [name] Other Clinician: Referring Rosine Solecki: [name] Treating Tal Kempker/Extender: Rudene Re in Treatment: 6 Vital Signs Height(in): 68 Pulse(bpm): 75 Weight(lbs): 240 Blood  Pressure 114/94 (mmHg): Body Mass Index(BMI): 36 Temperature(F): 97.7 Respiratory Rate 16 (breaths/min): Photos: [6:No Photos] Wound Location: Left Amputation Site - Left Malleolus Left, Lateral Amputation Transmetatarsal Site - Transmetatarsal Wounding Event: Surgical Injury Gradually Appeared Surgical Injury Primary Etiology: Refractory Osteomyelitis Pressure Ulcer Dehisced Wound Comorbid History: Anemia, Asthma, Chronic Anemia, Asthma, Chronic N/A Obstructive Pulmonary Obstructive Pulmonary Disease (COPD), Sleep Disease (COPD), Sleep Apnea, Congestive Heart Apnea, Congestive Heart Failure, Hypertension, Failure, Hypertension, Myocardial Infarction, Myocardial Infarction, Type I Diabetes, End Type I Diabetes, End Stage Renal  Disease, Stage Renal Disease, Osteoarthritis Osteoarthritis Date Acquired: 11/25/2016 08/27/2016 12/16/2016 Weeks of Treatment: 6 6 6  Wound Status: Open Open Converted Measurements L x W x D 0.5x13.5x0.1 2.4x2.1x0.1 N/A (cm) Area (cm) : 5.301 3.958 N/A Volume (cm) : 0.53 0.396 N/A % Reduction in Area: -87.50% -0.80% N/A % Reduction in Volume: 62.50% -0.80% N/A Classification: Unclassifiable Unstageable/Unclassified Full Thickness Without Exposed Support Structures DREYAH, MINKLER ([id number]) HBO Classification: Grade 0 Grade 1 N/A Exudate Amount: Medium None Present N/A Exudate Type: Serous N/A N/A Exudate Color: amber N/A N/A Wound Margin: Flat and Intact Flat and Intact N/A Granulation Amount: None Present (0%) N/A N/A Necrotic Amount: None Present (0%) N/A N/A Necrotic Tissue: N/A Eschar N/A Exposed Structures: Fascia: No Fascia: No N/A Fat Layer (Subcutaneous Fat Layer (Subcutaneous Tissue) Exposed: No Tissue) Exposed: No Tendon: No Tendon: No Muscle: No Muscle: No Joint: No Joint: No Bone: No Bone: No Limited to Skin Limited to Skin Breakdown Breakdown Epithelialization: None None N/A Periwound Skin Texture: Excoriation: Yes No  Abnormalities Noted No Abnormalities Noted Induration: Yes Callus: Yes Crepitus: Yes Rash: Yes Scarring: Yes Periwound Skin Maceration: Yes Dry/Scaly: Yes No Abnormalities Noted Moisture: Dry/Scaly: Yes Periwound Skin Color: Atrophie Blanche: Yes No Abnormalities Noted No Abnormalities Noted Cyanosis: Yes Ecchymosis: Yes Erythema: Yes Hemosiderin Staining: Yes Mottled: Yes Pallor: Yes Rubor: Yes Temperature: N/A No Abnormality N/A Tenderness on Yes Yes No Palpation: Wound Preparation: Ulcer Cleansing: Ulcer Cleansing: N/A Rinsed/Irrigated with Rinsed/Irrigated with Saline Saline Topical Anesthetic Topical Anesthetic Applied: Other: llidocaine Applied: None 4% Assessment Notes: Patient had surgical N/A N/A debridement and revision of wound on 4/25, 2018 by Dr. Leone Haven. Treatment Notes NIALA, CABBAGESTALK ([id number]) Electronic Signature(s) Signed: 02/09/2017 12:29:22 PM By: Evlyn Kanner MD, FACS Entered By: Evlyn Kanner on 02/09/2017 12:29:22 [name] ([id number]) -------------------------------------------------------------------------------- Multi-Disciplinary Care Plan Details Patient Name: [name] Date of Service: 02/09/2017 11:00 AM Medical Record Number: [id number] Patient Account Number: [id number] Date of Birth/Sex: [birth year] (36 y.o. Female) Treating RN: Huel Coventry Primary Care Xcaret Morad: [name] Other Clinician: Referring Mashell Sieben: [name] Treating Altus Zaino/Extender: Rudene Re in Treatment: 6 Active Inactive Electronic Signature(s) Signed: 02/12/2017 9:24:36 AM By: Elliot Gurney RN, BSN, Kim RN, BSN Previous Signature: 02/09/2017 2:37:11 PM Version By: Elliot Gurney RN, BSN, Kim RN, BSN Entered By: Elliot Gurney, RN, BSN, Kim on 02/11/2017 08:41:07 [name] ([id number]) -------------------------------------------------------------------------------- Pain Assessment Details Patient Name: [name] Date of Service: 02/09/2017 11:00 AM Medical Record  Number: [id number] Patient Account Number: [id number] Date of Birth/Sex: 1981/03/31 (36 y.o. Female) Treating RN: Huel Coventry Primary Care Ehtan Delfavero: [name] Other Clinician: Referring Elwanda Moger: [name] Treating Chiyeko Ferre/Extender: Rudene Re in Treatment: 6 Active Problems Location of Pain Severity and Description of Pain Patient Has Paino Yes Site Locations With Dressing Change: Yes Pain Management and Medication Current Pain Management: Electronic Signature(s) Signed: 02/09/2017 2:37:11 PM By: Elliot Gurney, RN, BSN, Kim RN, BSN Entered By: Elliot Gurney, RN, BSN, Kim on 02/09/2017 12:10:55 [name] ([id number]) -------------------------------------------------------------------------------- Patient/Caregiver Education Details Patient Name: [name] Date of Service: 02/09/2017 11:00 AM Medical Record Number: [id number] Patient Account Number: [id number] Date of Birth/Gender: [birth year] (36 y.o. Female) Treating RN: Huel Coventry Primary Care Physician: [name] Other Clinician: Referring Physician: [name] Treating Physician/Extender: Rudene Re in Treatment: 6 Education Assessment Education Provided To: Patient Education Topics Provided Wound/Skin Impairment: Handouts: Caring for Your Ulcer, Other: Follow up with Dr. Leone Haven Methods: Demonstration Responses: State content correctly Electronic Signature(s) Signed: 02/09/2017 2:37:11 PM By: Elliot Gurney, RN, BSN, Kim RN, BSN Entered By:  Elliot Gurney, RN, BSN, Kim on 02/09/2017 12:32:41 [name] ([id number]) -------------------------------------------------------------------------------- Wound Assessment Details Patient Name: HONESTII, MARTON Date of Service: 02/09/2017 11:00 AM Medical Record Number: [id number] Patient Account Number: [id number] Date of Birth/Sex: [birth year] (36 y.o. Female) Treating RN: Huel Coventry Primary Care Zowie Lundahl: [name] Other Clinician: Referring Fatou Dunnigan: [name] Treating Harlon Kutner/Extender: Rudene Re in Treatment: 6 Wound Status Wound Number: 4 Primary Refractory Osteomyelitis Etiology: Wound Location: Left Amputation Site - Transmetatarsal Wound Open Status: Wounding Event: Surgical Injury Comorbid Anemia, Asthma, Chronic Obstructive Date Acquired: 11/25/2016 History: Pulmonary Disease (COPD), Sleep Weeks Of Treatment: 6 Apnea, Congestive Heart Failure, Clustered Wound: No Hypertension, Myocardial Infarction, Type I Diabetes, End Stage Renal Disease, Osteoarthritis Photos Wound Measurements Length: (cm) 0.5 Width: (cm) 13.5 Depth: (cm) 0.1 Area: (cm) 5.301 Volume: (cm) 0.53 % Reduction in Area: -87.5% % Reduction in Volume: 62.5% Epithelialization: None Wound Description Classification: Unclassifiable Foul Odor A Diabetic Severity (Wagner): Grade 0 Slough/Fibr Wound Margin: Flat and Intact Exudate Amount: Medium Exudate Type: Serous Exudate Color: amber fter Cleansing: No ino Yes Wound Bed Granulation Amount: None Present (0%) Exposed Structure Necrotic Amount: None Present (0%) Fascia Exposed: No Fat Layer (Subcutaneous Tissue) Exposed: No Eid, Belina ([id number]) Tendon Exposed: No Muscle Exposed: No Joint Exposed: No Bone Exposed: No Limited to Skin Breakdown Periwound Skin Texture Texture Color No Abnormalities Noted: No No Abnormalities Noted: No Callus: Yes Atrophie Blanche: Yes Crepitus: Yes Cyanosis: Yes Excoriation: Yes Ecchymosis: Yes Induration: Yes Erythema: Yes Rash: Yes Hemosiderin Staining: Yes Scarring: Yes Mottled: Yes Pallor: Yes Moisture Rubor: Yes No Abnormalities Noted: No Dry / Scaly: Yes Temperature / Pain Maceration: Yes Tenderness on Palpation: Yes Wound Preparation Ulcer Cleansing: Rinsed/Irrigated with Saline Topical Anesthetic Applied: Other: llidocaine 4%, Assessment Notes Patient had surgical debridement and revision of wound on 4/25, 2018 by Dr.  Leone Haven. Electronic Signature(s) Signed: 02/09/2017 2:37:11 PM By: Elliot Gurney RN, BSN, Kim RN, BSN Entered By: Elliot Gurney, RN, BSN, Kim on 02/09/2017 12:16:37 [name] ([id number]) -------------------------------------------------------------------------------- Wound Assessment Details Patient Name: [name] Date of Service: 02/09/2017 11:00 AM Medical Record Number: [id number] Patient Account Number: [id number] Date of Birth/Sex: April 20, 1981 (36 y.o. Female) Treating RN: Huel Coventry Primary Care Brannen Koppen: [name] Other Clinician: Referring Elizandro Laura: [name] Treating Cherity Blickenstaff/Extender: Rudene Re in Treatment: 6 Wound Status Wound Number: 5 Primary Pressure Ulcer Etiology: Wound Location: Left Malleolus Wound Open Wounding Event: Gradually Appeared Status: Date Acquired: 08/27/2016 Comorbid Anemia, Asthma, Chronic Obstructive Weeks Of Treatment: 6 History: Pulmonary Disease (COPD), Sleep Clustered Wound: No Apnea, Congestive Heart Failure, Hypertension, Myocardial Infarction, Type I Diabetes, End Stage Renal Disease, Osteoarthritis Photos Wound Measurements Length: (cm) 2.4 Width: (cm) 2.1 Depth: (cm) 0.1 Area: (cm) 3.958 Volume: (cm) 0.396 % Reduction in Area: -0.8% % Reduction in Volume: -0.8% Epithelialization: None Tunneling: No Undermining: No Wound Description Classification: Unstageable/Unclassified Foul Od Diabetic Severity (Wagner): Grade 1 Slough/ Wound Margin: Flat and Intact Exudate Amount: None Present or After Cleansing: No Fibrino No Wound Bed Necrotic Amount: Large (67-100%) Exposed Structure Necrotic Quality: Eschar Fascia Exposed: No Fat Layer (Subcutaneous Tissue) Exposed: No Tendon Exposed: No Muscle Exposed: No AYELEN, SCIORTINO ([id number]) Joint Exposed: No Bone Exposed: No Limited to Skin Breakdown Periwound Skin Texture Texture Color No Abnormalities Noted: No No Abnormalities Noted: No Moisture Temperature /  Pain No Abnormalities Noted: No Temperature: No Abnormality Dry / Scaly: Yes Tenderness on Palpation: Yes Wound Preparation Ulcer Cleansing: Rinsed/Irrigated with Saline Topical Anesthetic Applied: None Electronic Signature(s) Signed: 02/09/2017 2:37:11 PM By: Elliot Gurney,  RN, BSN, Kim RN, BSN Entered By: Elliot Gurney, RN, BSN, Kim on 02/09/2017 12:17:16 [name] ([id number]) -------------------------------------------------------------------------------- Wound Assessment Details Patient Name: [name] Date of Service: 02/09/2017 11:00 AM Medical Record Number: [id number] Patient Account Number: [id number] Date of Birth/Sex: 02/24/1981 (36 y.o. Female) Treating RN: Huel Coventry Primary Care Netta Fodge: [name] Other Clinician: Referring Ajayla Iglesias: [name] Treating Angeliah Wisdom/Extender: Rudene Re in Treatment: 6 Wound Status Wound Number: 6 Primary Etiology: Dehisced Wound Wound Location: Left, Lateral Amputation Site - Wound Status: Converted Transmetatarsal Wounding Event: Surgical Injury Date Acquired: 12/16/2016 Weeks Of Treatment: 6 Clustered Wound: No Wound Description Full Thickness Without Exposed Classification: Support Structures Periwound Skin Texture Texture Color No Abnormalities Noted: No No Abnormalities Noted: No Moisture No Abnormalities Noted: No Electronic Signature(s) Signed: 02/09/2017 2:37:11 PM By: Elliot Gurney, RN, BSN, Kim RN, BSN Entered By: Elliot Gurney, RN, BSN, Kim on 02/09/2017 12:17:30 [name] ([id number]) -------------------------------------------------------------------------------- Vitals Details Patient Name: [name] Date of Service: 02/09/2017 11:00 AM Medical Record Number: [id number] Patient Account Number: [id number] Date of Birth/Sex: 1980-12-17 (36 y.o. Female) Treating RN: Huel Coventry Primary Care Alazne Quant: [name] Other Clinician: Referring Keshav Winegar: [name] Treating Andrey Mccaskill/Extender: Rudene Re  in Treatment: 6 Vital Signs Time Taken: 12:11 Temperature (F): 97.7 Height (in): 68 Pulse (bpm): 75 Weight (lbs): 240 Respiratory Rate (breaths/min): 16 Body Mass Index (BMI): 36.5 Blood Pressure (mmHg): 114/94 Reference Range: 80 - 120 mg / dl Electronic Signature(s) Signed: 02/09/2017 2:37:11 PM By: Elliot Gurney, RN, BSN, Kim RN, BSN Entered By: Elliot Gurney, RN, BSN, Kim on 02/09/2017 12:11:56

## 2017-02-12 ENCOUNTER — Encounter (INDEPENDENT_AMBULATORY_CARE_PROVIDER_SITE_OTHER): Payer: Self-pay | Admitting: Family

## 2017-02-12 ENCOUNTER — Ambulatory Visit (INDEPENDENT_AMBULATORY_CARE_PROVIDER_SITE_OTHER): Payer: Medicaid Other | Admitting: Family

## 2017-02-12 VITALS — Ht 68.0 in | Wt 243.0 lb

## 2017-02-12 DIAGNOSIS — N185 Chronic kidney disease, stage 5: Secondary | ICD-10-CM

## 2017-02-12 DIAGNOSIS — Z794 Long term (current) use of insulin: Secondary | ICD-10-CM

## 2017-02-12 DIAGNOSIS — Z89432 Acquired absence of left foot: Secondary | ICD-10-CM

## 2017-02-12 DIAGNOSIS — I70263 Atherosclerosis of native arteries of extremities with gangrene, bilateral legs: Secondary | ICD-10-CM

## 2017-02-12 DIAGNOSIS — T8781 Dehiscence of amputation stump: Secondary | ICD-10-CM

## 2017-02-12 DIAGNOSIS — IMO0002 Reserved for concepts with insufficient information to code with codable children: Secondary | ICD-10-CM

## 2017-02-12 DIAGNOSIS — E1122 Type 2 diabetes mellitus with diabetic chronic kidney disease: Secondary | ICD-10-CM

## 2017-02-12 DIAGNOSIS — E1165 Type 2 diabetes mellitus with hyperglycemia: Secondary | ICD-10-CM

## 2017-02-12 DIAGNOSIS — M7989 Other specified soft tissue disorders: Secondary | ICD-10-CM

## 2017-02-12 MED ORDER — SULFAMETHOXAZOLE-TRIMETHOPRIM 800-160 MG PO TABS: 1.0000 | ORAL_TABLET | Freq: Two times a day (BID) | ORAL | 0 refills | Status: DC

## 2017-02-12 NOTE — Progress Notes (Signed)
Post-Op Visit Note   Patient: [name]           Date of Birth: 1981-07-27           MRN: [id number] Visit Date: 02/12/2017 PCP: [name], FNP  Chief Complaint:  Chief Complaint  Patient presents with  . Left Foot - Follow-up    01/21/17 left foot revision transmet    HPI:  Patient is a 36 year old woman who presents 2 weeks status post revision of left transmetatarsal amputation. Is concerned about incision opening up, some darkness over her anterior ankle and calf pain. Has been immobile for months recovering from LE surgeries. Is not taking any anticoagulation or ASA daily.     Ortho Exam Incision is approximated with sutures. Gaping centrally and laterally. This is gaped open a cm and is 1 cm deep. No exposed bone. There is surrounding erythema. No drainage. No odor. Does have maceration laterally. Massive swelling to LE and foot. 3+ pitting. Calf tenderness. No palpable cords.   Visit Diagnoses:  1. Dehiscence of amputation stump (HCC)   2. Status post transmetatarsal amputation of left foot (HCC)   3. 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 Calf swelling     Plan: Cleanse incision daily. Apply dry dressing. Have provided an order for Bactrim. Will also order Doppler to r/o DVT left calf. Would like to keep her appointment for this coming Monday. Discussed importance of elevation and compression.   Follow-Up Instructions: Return in about 2 weeks (around 02/26/2017).   Imaging: No results found.  Orders:  No orders of the defined types were placed in this encounter.  Meds ordered this encounter  Medications  . DISCONTD: sulfamethoxazole-trimethoprim (BACTRIM DS,SEPTRA DS) 800-160 MG tablet    Sig: Take 1 tablet by mouth 2 (two) times daily.    Dispense:  20 tablet   Refill:  0  . sulfamethoxazole-trimethoprim (BACTRIM DS,SEPTRA DS) 800-160 MG tablet    Sig: Take 1 tablet by mouth 2 (two) times daily.    Dispense:  20 tablet    Refill:  0     PMFS History: Patient Active Problem List   Diagnosis Date Noted  . Dehiscence of amputation stump (HCC)   . Status post transmetatarsal amputation of left foot (HCC) 10/21/2016  . Status post transmetatarsal amputation of right foot (HCC) 10/08/2016  . 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 Diabetic osteomyelitis (HCC)   . 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 Diabetic foot infection (HCC) 06/20/2016  . ESRD (end stage renal disease) on dialysis (HCC) 06/20/2016  . Diabetic foot ulcer (HCC) 06/20/2016  . Pre-operative clearance 10/12/2015  . Normal coronary arteries 10/12/2015  . NSTEMI- type 2, Troponin 11.2 05/11/2015  . Respiratory failure requiring intubation (HCC) 05/10/2015  . History of arthroscopy of right shoulder-05/10/15 05/10/2015  . CKD (chronic kidney disease) stage 5, GFR less than 15 ml/min (HCC) 09/21/2014  . Unspecified asthma(493.90) 10/25/2013  . Acute combined systolic and diastolic heart failure (HCC) 09/21/2013  . Non-ischemic cardiomyopathy - by echo 8/16- EF 35-40% 09/16/2013  . Morbid obesity-  09/16/2013  . 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) 09/15/2013  . Normocytic anemia 09/15/2013  . Lower extremity edema 09/15/2013  . Hyperlipidemia   .  Hypertension    Past Medical History:  Diagnosis Date  . Anemia   . Arthritis    "left hand" (09/15/2013)  . Asthma   . CHF (congestive heart failure) (HCC)   . Chronic bronchitis (HCC)    "just about q yr" (09/15/2013)  . Chronic kidney disease    "low kidney function" (09/15/2013) ,  T/Th/Sa  . COPD (chronic obstructive pulmonary disease) (HCC)   . Coronary artery disease   . Hyperlipidemia   . Hypertension   . Migraine    "get them  alot" (09/15/2013)  . Myocardial infarction [facility]) 04/2015   NSTEMI  . Normal coronary arteries    by cardiac catheterization 09/20/13  . Peripheral vascular disease (HCC)   . Pneumonia    "couple times; have it now" (09/15/2013)  . PONV (postoperative nausea and vomiting)   . Restless legs   . Shortness of breath    "just recently; related to the pneumonia" (09/15/2013)  . Type 1 diabetes mellitus (HCC)    type 2    Family History  Problem Relation Age of Onset  . Diabetes Mother   . Stroke Mother   . Hypertension Father   . Hyperlipidemia Father   . Cancer - Lung Father   . Diabetes Maternal Grandmother   . Cancer Paternal Grandmother     Past Surgical History:  Procedure Laterality Date  . AMPUTATION Right 06/25/2016   Procedure: Amputation Right Great Toe at the Metatarsophalangeal Joint;  Surgeon: Nadara Mustard, MD;  Location: [facility] OR;  Service: Orthopedics;  Laterality: Right;  . AMPUTATION Bilateral 10/08/2016   Procedure: Bilateral Transmetatarsal Amputation;  Surgeon: Nadara Mustard, MD;  Location: [facility] OR;  Service: Orthopedics;  Laterality: Bilateral;  . AV FISTULA PLACEMENT Left 03/27/2015   Procedure: CREATION RADIAL CEPHALIC ARTERIOVENOUS FISTULA;  Surgeon: Chuck Hint, MD;  Location: [facility] OR;  Service: Vascular;  Laterality: Left;  . AV FISTULA PLACEMENT Left 11/23/2015   Procedure:  LEFT ARM BASILIC VEIN TRANSPOSITION  ;  Surgeon: Chuck Hint, MD;  Location: [facility] OR;  Service: Vascular;  Laterality: Left;  . CESAREAN SECTION  1999; 2006  . CORONARY ANGIOGRAM  09/20/2013   Procedure: CORONARY ANGIOGRAM;  Surgeon: Runell Gess, MD;  Location: [facility] CATH LAB;  Service: Cardiovascular;;  . FINGER SURGERY Left 1985   3rd and 4th digits reconstructed after cut off" (09/15/2013)  . PERIPHERAL VASCULAR CATHETERIZATION N/A 09/05/2016   Procedure: Abdominal Aortogram;  Surgeon: Sherren Kerns, MD;  Location: [facility] INVASIVE CV LAB;  Service: Cardiovascular;   Laterality: N/A;  . PERIPHERAL VASCULAR CATHETERIZATION Bilateral 09/05/2016   Procedure: Lower Extremity Angiography;  Surgeon: Sherren Kerns, MD;  Location: [facility] INVASIVE CV LAB;  Service: Cardiovascular;  Laterality: Bilateral;  . PERIPHERAL VASCULAR CATHETERIZATION Right 09/05/2016   Procedure: Peripheral Vascular Balloon Angioplasty;  Surgeon: Sherren Kerns, MD;  Location: [facility] INVASIVE CV LAB;  Service: Cardiovascular;  Laterality: Right;  peroneal and AT  . SHOULDER ARTHROSCOPY WITH BICEPSTENOTOMY Right 05/10/2015   Procedure: RIGHT SHOULDER ARTHROSCOPY WITH BICEPS TENOTOMY, DEBRIDEMENT LABRAL TEAR;  Surgeon: Jones Broom, MD;  Location: [facility] OR;  Service: Orthopedics;  Laterality: Right;  Right shoulder arthroscopy biceps tenotomy, debridement labral tear  . STUMP REVISION Left 01/21/2017   Procedure: . Revision Left Transmetatarsal Amputation;  Surgeon: Nadara Mustard, MD;  Location: [facility] OR;  Service: Orthopedics;  Laterality: Left;  . TONSILLECTOMY  1997  . TUBAL LIGATION  2006   Social History   Occupational  History  . Student    Social History Main Topics  . Smoking status: Never Smoker  . Smokeless tobacco: Never Used  . Alcohol use No  . Drug use: No  . Sexual activity: Not Currently    Partners: Male    Birth control/ protection: Other-see comments     Comment: S/P tubal ligation

## 2017-02-13 ENCOUNTER — Telehealth (INDEPENDENT_AMBULATORY_CARE_PROVIDER_SITE_OTHER): Payer: Self-pay

## 2017-02-13 ENCOUNTER — Ambulatory Visit (HOSPITAL_BASED_OUTPATIENT_CLINIC_OR_DEPARTMENT_OTHER)
Admission: RE | Admit: 2017-02-13 | Discharge: 2017-02-13 | Disposition: A | Discharge status: Home / Self Care | Payer: Medicaid Other | Source: Ambulatory Visit | Attending: Family | Admitting: Family

## 2017-02-13 DIAGNOSIS — M7122 Synovial cyst of popliteal space [Baker], left knee: Secondary | ICD-10-CM

## 2017-02-13 DIAGNOSIS — M7989 Other specified soft tissue disorders: Secondary | ICD-10-CM

## 2017-02-13 DIAGNOSIS — Z89432 Acquired absence of left foot: Secondary | ICD-10-CM

## 2017-02-13 NOTE — Progress Notes (Signed)
Preliminary results by tech - Negative for Deep and superficial vein thrombosis in the left leg. Results left with the office's triage nurse. Marilynne Halsted, BS, RDMS, RVT

## 2017-02-13 NOTE — Telephone Encounter (Signed)
Called pt to advise that her U/S was negative for DVT and that she has an appt with Dr. [name] on Monday to continue with treatment plan and will re eval at that appt. Pt voiced understanding and that she will call with questions.

## 2017-02-14 ENCOUNTER — Inpatient Hospital Stay (HOSPITAL_COMMUNITY): Payer: Medicaid Other

## 2017-02-14 ENCOUNTER — Encounter: Payer: Self-pay | Admitting: *Deleted

## 2017-02-14 ENCOUNTER — Emergency Department (HOSPITAL_COMMUNITY): Payer: Medicaid Other

## 2017-02-14 ENCOUNTER — Inpatient Hospital Stay (HOSPITAL_COMMUNITY): Payer: Medicaid Other | Admitting: Certified Registered Nurse Anesthetist

## 2017-02-14 ENCOUNTER — Inpatient Hospital Stay (HOSPITAL_COMMUNITY)
Admission: EM | Admit: 2017-02-14 | Discharge: 2017-04-30 | DRG: 003 | Disposition: A | Discharge status: Skilled Nursing Facility | Payer: Medicaid Other | Attending: Internal Medicine | Admitting: Internal Medicine

## 2017-02-14 ENCOUNTER — Encounter (HOSPITAL_COMMUNITY): Payer: Self-pay | Admitting: Emergency Medicine

## 2017-02-14 DIAGNOSIS — R4020 Unspecified coma: Secondary | ICD-10-CM

## 2017-02-14 DIAGNOSIS — G894 Chronic pain syndrome: Secondary | ICD-10-CM | POA: Diagnosis present

## 2017-02-14 DIAGNOSIS — R1312 Dysphagia, oropharyngeal phase: Secondary | ICD-10-CM | POA: Diagnosis not present

## 2017-02-14 DIAGNOSIS — I96 Gangrene, not elsewhere classified: Secondary | ICD-10-CM | POA: Diagnosis present

## 2017-02-14 DIAGNOSIS — L89610 Pressure ulcer of right heel, unstageable: Secondary | ICD-10-CM | POA: Diagnosis not present

## 2017-02-14 DIAGNOSIS — L02416 Cutaneous abscess of left lower limb: Secondary | ICD-10-CM | POA: Diagnosis present

## 2017-02-14 DIAGNOSIS — I950 Idiopathic hypotension: Secondary | ICD-10-CM | POA: Diagnosis not present

## 2017-02-14 DIAGNOSIS — Z794 Long term (current) use of insulin: Secondary | ICD-10-CM

## 2017-02-14 DIAGNOSIS — Z930 Tracheostomy status: Secondary | ICD-10-CM

## 2017-02-14 DIAGNOSIS — G2581 Restless legs syndrome: Secondary | ICD-10-CM | POA: Diagnosis present

## 2017-02-14 DIAGNOSIS — Z978 Presence of other specified devices: Secondary | ICD-10-CM

## 2017-02-14 DIAGNOSIS — K7200 Acute and subacute hepatic failure without coma: Secondary | ICD-10-CM | POA: Diagnosis not present

## 2017-02-14 DIAGNOSIS — IMO0002 Reserved for concepts with insufficient information to code with codable children: Secondary | ICD-10-CM | POA: Diagnosis present

## 2017-02-14 DIAGNOSIS — E1140 Type 2 diabetes mellitus with diabetic neuropathy, unspecified: Secondary | ICD-10-CM | POA: Diagnosis present

## 2017-02-14 DIAGNOSIS — Z9911 Dependence on respirator [ventilator] status: Secondary | ICD-10-CM | POA: Diagnosis not present

## 2017-02-14 DIAGNOSIS — R748 Abnormal levels of other serum enzymes: Secondary | ICD-10-CM

## 2017-02-14 DIAGNOSIS — R509 Fever, unspecified: Secondary | ICD-10-CM

## 2017-02-14 DIAGNOSIS — E1165 Type 2 diabetes mellitus with hyperglycemia: Secondary | ICD-10-CM | POA: Diagnosis not present

## 2017-02-14 DIAGNOSIS — R000 Tachycardia, unspecified: Secondary | ICD-10-CM | POA: Diagnosis not present

## 2017-02-14 DIAGNOSIS — I5043 Acute on chronic combined systolic (congestive) and diastolic (congestive) heart failure: Secondary | ICD-10-CM | POA: Diagnosis present

## 2017-02-14 DIAGNOSIS — L02612 Cutaneous abscess of left foot: Secondary | ICD-10-CM | POA: Diagnosis present

## 2017-02-14 DIAGNOSIS — Z4659 Encounter for fitting and adjustment of other gastrointestinal appliance and device: Secondary | ICD-10-CM

## 2017-02-14 DIAGNOSIS — Z888 Allergy status to other drugs, medicaments and biological substances status: Secondary | ICD-10-CM

## 2017-02-14 DIAGNOSIS — I132 Hypertensive heart and chronic kidney disease with heart failure and with stage 5 chronic kidney disease, or end stage renal disease: Secondary | ICD-10-CM | POA: Diagnosis present

## 2017-02-14 DIAGNOSIS — Z7189 Other specified counseling: Secondary | ICD-10-CM

## 2017-02-14 DIAGNOSIS — E876 Hypokalemia: Secondary | ICD-10-CM | POA: Diagnosis not present

## 2017-02-14 DIAGNOSIS — A419 Sepsis, unspecified organism: Secondary | ICD-10-CM | POA: Diagnosis not present

## 2017-02-14 DIAGNOSIS — I10 Essential (primary) hypertension: Secondary | ICD-10-CM | POA: Diagnosis not present

## 2017-02-14 DIAGNOSIS — J9621 Acute and chronic respiratory failure with hypoxia: Secondary | ICD-10-CM | POA: Diagnosis not present

## 2017-02-14 DIAGNOSIS — J9600 Acute respiratory failure, unspecified whether with hypoxia or hypercapnia: Secondary | ICD-10-CM | POA: Diagnosis not present

## 2017-02-14 DIAGNOSIS — D649 Anemia, unspecified: Secondary | ICD-10-CM | POA: Diagnosis not present

## 2017-02-14 DIAGNOSIS — J449 Chronic obstructive pulmonary disease, unspecified: Secondary | ICD-10-CM | POA: Diagnosis present

## 2017-02-14 DIAGNOSIS — I5041 Acute combined systolic (congestive) and diastolic (congestive) heart failure: Secondary | ICD-10-CM | POA: Diagnosis not present

## 2017-02-14 DIAGNOSIS — J690 Pneumonitis due to inhalation of food and vomit: Secondary | ICD-10-CM | POA: Diagnosis not present

## 2017-02-14 DIAGNOSIS — T8781 Dehiscence of amputation stump: Secondary | ICD-10-CM | POA: Diagnosis present

## 2017-02-14 DIAGNOSIS — R531 Weakness: Secondary | ICD-10-CM | POA: Diagnosis not present

## 2017-02-14 DIAGNOSIS — L089 Local infection of the skin and subcutaneous tissue, unspecified: Secondary | ICD-10-CM

## 2017-02-14 DIAGNOSIS — Z8249 Family history of ischemic heart disease and other diseases of the circulatory system: Secondary | ICD-10-CM

## 2017-02-14 DIAGNOSIS — J9601 Acute respiratory failure with hypoxia: Secondary | ICD-10-CM | POA: Diagnosis not present

## 2017-02-14 DIAGNOSIS — E873 Alkalosis: Secondary | ICD-10-CM | POA: Diagnosis not present

## 2017-02-14 DIAGNOSIS — Z515 Encounter for palliative care: Secondary | ICD-10-CM | POA: Diagnosis not present

## 2017-02-14 DIAGNOSIS — E440 Moderate protein-calorie malnutrition: Secondary | ICD-10-CM | POA: Diagnosis present

## 2017-02-14 DIAGNOSIS — Z431 Encounter for attention to gastrostomy: Secondary | ICD-10-CM | POA: Diagnosis not present

## 2017-02-14 DIAGNOSIS — E1129 Type 2 diabetes mellitus with other diabetic kidney complication: Secondary | ICD-10-CM | POA: Diagnosis present

## 2017-02-14 DIAGNOSIS — J9690 Respiratory failure, unspecified, unspecified whether with hypoxia or hypercapnia: Secondary | ICD-10-CM

## 2017-02-14 DIAGNOSIS — Z886 Allergy status to analgesic agent status: Secondary | ICD-10-CM

## 2017-02-14 DIAGNOSIS — B379 Candidiasis, unspecified: Secondary | ICD-10-CM | POA: Diagnosis not present

## 2017-02-14 DIAGNOSIS — E785 Hyperlipidemia, unspecified: Secondary | ICD-10-CM | POA: Diagnosis present

## 2017-02-14 DIAGNOSIS — I2510 Atherosclerotic heart disease of native coronary artery without angina pectoris: Secondary | ICD-10-CM | POA: Diagnosis present

## 2017-02-14 DIAGNOSIS — R011 Cardiac murmur, unspecified: Secondary | ICD-10-CM | POA: Diagnosis not present

## 2017-02-14 DIAGNOSIS — N2581 Secondary hyperparathyroidism of renal origin: Secondary | ICD-10-CM | POA: Diagnosis present

## 2017-02-14 DIAGNOSIS — L8990 Pressure ulcer of unspecified site, unspecified stage: Secondary | ICD-10-CM | POA: Insufficient documentation

## 2017-02-14 DIAGNOSIS — G92 Toxic encephalopathy: Secondary | ICD-10-CM | POA: Diagnosis not present

## 2017-02-14 DIAGNOSIS — R609 Edema, unspecified: Secondary | ICD-10-CM

## 2017-02-14 DIAGNOSIS — M86172 Other acute osteomyelitis, left ankle and foot: Secondary | ICD-10-CM | POA: Diagnosis not present

## 2017-02-14 DIAGNOSIS — E1169 Type 2 diabetes mellitus with other specified complication: Principal | ICD-10-CM | POA: Diagnosis present

## 2017-02-14 DIAGNOSIS — E874 Mixed disorder of acid-base balance: Secondary | ICD-10-CM | POA: Diagnosis not present

## 2017-02-14 DIAGNOSIS — E1152 Type 2 diabetes mellitus with diabetic peripheral angiopathy with gangrene: Secondary | ICD-10-CM | POA: Diagnosis present

## 2017-02-14 DIAGNOSIS — Z809 Family history of malignant neoplasm, unspecified: Secondary | ICD-10-CM

## 2017-02-14 DIAGNOSIS — G931 Anoxic brain damage, not elsewhere classified: Secondary | ICD-10-CM

## 2017-02-14 DIAGNOSIS — R0602 Shortness of breath: Secondary | ICD-10-CM

## 2017-02-14 DIAGNOSIS — Z833 Family history of diabetes mellitus: Secondary | ICD-10-CM

## 2017-02-14 DIAGNOSIS — Z09 Encounter for follow-up examination after completed treatment for conditions other than malignant neoplasm: Secondary | ICD-10-CM

## 2017-02-14 DIAGNOSIS — Z992 Dependence on renal dialysis: Secondary | ICD-10-CM | POA: Diagnosis not present

## 2017-02-14 DIAGNOSIS — T17908S Unspecified foreign body in respiratory tract, part unspecified causing other injury, sequela: Secondary | ICD-10-CM | POA: Diagnosis not present

## 2017-02-14 DIAGNOSIS — N186 End stage renal disease: Secondary | ICD-10-CM | POA: Diagnosis not present

## 2017-02-14 DIAGNOSIS — Z6824 Body mass index (BMI) 24.0-24.9, adult: Secondary | ICD-10-CM

## 2017-02-14 DIAGNOSIS — E43 Unspecified severe protein-calorie malnutrition: Secondary | ICD-10-CM | POA: Diagnosis not present

## 2017-02-14 DIAGNOSIS — N185 Chronic kidney disease, stage 5: Secondary | ICD-10-CM | POA: Diagnosis not present

## 2017-02-14 DIAGNOSIS — I252 Old myocardial infarction: Secondary | ICD-10-CM

## 2017-02-14 DIAGNOSIS — T8130XA Disruption of wound, unspecified, initial encounter: Secondary | ICD-10-CM

## 2017-02-14 DIAGNOSIS — M79605 Pain in left leg: Secondary | ICD-10-CM | POA: Diagnosis present

## 2017-02-14 DIAGNOSIS — Y835 Amputation of limb(s) as the cause of abnormal reaction of the patient, or of later complication, without mention of misadventure at the time of the procedure: Secondary | ICD-10-CM | POA: Diagnosis present

## 2017-02-14 DIAGNOSIS — E1021 Type 1 diabetes mellitus with diabetic nephropathy: Secondary | ICD-10-CM | POA: Diagnosis not present

## 2017-02-14 DIAGNOSIS — R0902 Hypoxemia: Secondary | ICD-10-CM

## 2017-02-14 DIAGNOSIS — Z452 Encounter for adjustment and management of vascular access device: Secondary | ICD-10-CM | POA: Diagnosis not present

## 2017-02-14 DIAGNOSIS — E1122 Type 2 diabetes mellitus with diabetic chronic kidney disease: Secondary | ICD-10-CM | POA: Diagnosis present

## 2017-02-14 DIAGNOSIS — I428 Other cardiomyopathies: Secondary | ICD-10-CM | POA: Diagnosis present

## 2017-02-14 DIAGNOSIS — Z823 Family history of stroke: Secondary | ICD-10-CM

## 2017-02-14 DIAGNOSIS — Z0189 Encounter for other specified special examinations: Secondary | ICD-10-CM

## 2017-02-14 DIAGNOSIS — R6510 Systemic inflammatory response syndrome (SIRS) of non-infectious origin without acute organ dysfunction: Secondary | ICD-10-CM | POA: Diagnosis not present

## 2017-02-14 DIAGNOSIS — D72829 Elevated white blood cell count, unspecified: Secondary | ICD-10-CM | POA: Diagnosis not present

## 2017-02-14 DIAGNOSIS — R17 Unspecified jaundice: Secondary | ICD-10-CM

## 2017-02-14 DIAGNOSIS — M869 Osteomyelitis, unspecified: Secondary | ICD-10-CM | POA: Diagnosis present

## 2017-02-14 DIAGNOSIS — T17908A Unspecified foreign body in respiratory tract, part unspecified causing other injury, initial encounter: Secondary | ICD-10-CM | POA: Diagnosis not present

## 2017-02-14 DIAGNOSIS — D696 Thrombocytopenia, unspecified: Secondary | ICD-10-CM | POA: Diagnosis not present

## 2017-02-14 DIAGNOSIS — Z7951 Long term (current) use of inhaled steroids: Secondary | ICD-10-CM

## 2017-02-14 DIAGNOSIS — D631 Anemia in chronic kidney disease: Secondary | ICD-10-CM | POA: Diagnosis present

## 2017-02-14 DIAGNOSIS — Z882 Allergy status to sulfonamides status: Secondary | ICD-10-CM

## 2017-02-14 DIAGNOSIS — L89619 Pressure ulcer of right heel, unspecified stage: Secondary | ICD-10-CM | POA: Diagnosis not present

## 2017-02-14 DIAGNOSIS — J811 Chronic pulmonary edema: Secondary | ICD-10-CM

## 2017-02-14 DIAGNOSIS — R0682 Tachypnea, not elsewhere classified: Secondary | ICD-10-CM

## 2017-02-14 DIAGNOSIS — I2699 Other pulmonary embolism without acute cor pulmonale: Secondary | ICD-10-CM

## 2017-02-14 DIAGNOSIS — I469 Cardiac arrest, cause unspecified: Secondary | ICD-10-CM

## 2017-02-14 DIAGNOSIS — I340 Nonrheumatic mitral (valve) insufficiency: Secondary | ICD-10-CM | POA: Diagnosis not present

## 2017-02-14 DIAGNOSIS — I360 Nonrheumatic tricuspid (valve) stenosis: Secondary | ICD-10-CM | POA: Diagnosis not present

## 2017-02-14 DIAGNOSIS — R1313 Dysphagia, pharyngeal phase: Secondary | ICD-10-CM | POA: Diagnosis not present

## 2017-02-14 DIAGNOSIS — E46 Unspecified protein-calorie malnutrition: Secondary | ICD-10-CM

## 2017-02-14 DIAGNOSIS — J9500 Unspecified tracheostomy complication: Secondary | ICD-10-CM | POA: Diagnosis not present

## 2017-02-14 DIAGNOSIS — F329 Major depressive disorder, single episode, unspecified: Secondary | ICD-10-CM | POA: Diagnosis present

## 2017-02-14 DIAGNOSIS — L89159 Pressure ulcer of sacral region, unspecified stage: Secondary | ICD-10-CM | POA: Diagnosis not present

## 2017-02-14 LAB — COMPREHENSIVE METABOLIC PANEL
ALK PHOS: 213 U/L — AB (ref 38–126)
ALK PHOS: 181 U/L — AB (ref 38–126)
ALT: 21 U/L (ref 14–54)
ALT: 17 U/L (ref 14–54)
AST: 17 U/L (ref 15–41)
AST: 16 U/L (ref 15–41)
Albumin: 2.6 g/dL — ABNORMAL LOW (ref 3.5–5.0)
Albumin: 2.5 g/dL — ABNORMAL LOW (ref 3.5–5.0)
Anion gap: 21 — ABNORMAL HIGH (ref 5–15)
Anion gap: 19 — ABNORMAL HIGH (ref 5–15)
BILIRUBIN TOTAL: 4.1 mg/dL — AB (ref 0.3–1.2)
BUN: 94 mg/dL — ABNORMAL HIGH (ref 6–20)
BUN: 97 mg/dL — ABNORMAL HIGH (ref 6–20)
CALCIUM: 7.3 mg/dL — AB (ref 8.9–10.3)
CO2: 22 mmol/L (ref 22–32)
CO2: 19 mmol/L — ABNORMAL LOW (ref 22–32)
CREATININE: 5.98 mg/dL — AB (ref 0.44–1.00)
Calcium: 7.7 mg/dL — ABNORMAL LOW (ref 8.9–10.3)
Chloride: 94 mmol/L — ABNORMAL LOW (ref 101–111)
Chloride: 96 mmol/L — ABNORMAL LOW (ref 101–111)
Creatinine, Ser: 6.23 mg/dL — ABNORMAL HIGH (ref 0.44–1.00)
GFR calc non Af Amer: 8 mL/min — ABNORMAL LOW (ref >60)
GFR, EST AFRICAN AMERICAN: 9 mL/min — AB (ref >60)
GFR, EST AFRICAN AMERICAN: 9 mL/min — AB (ref >60)
GFR, EST NON AFRICAN AMERICAN: 8 mL/min — AB (ref >60)
GLUCOSE: 79 mg/dL (ref 65–99)
Glucose, Bld: 98 mg/dL (ref 65–99)
Potassium: 4.2 mmol/L (ref 3.5–5.1)
Potassium: 4.4 mmol/L (ref 3.5–5.1)
SODIUM: 137 mmol/L (ref 135–145)
Sodium: 134 mmol/L — ABNORMAL LOW (ref 135–145)
Total Bilirubin: 3.8 mg/dL — ABNORMAL HIGH (ref 0.3–1.2)
Total Protein: 6.6 g/dL (ref 6.5–8.1)
Total Protein: 5.8 g/dL — ABNORMAL LOW (ref 6.5–8.1)

## 2017-02-14 LAB — BASIC METABOLIC PANEL
ANION GAP: 23 — AB (ref 5–15)
BUN: 101 mg/dL — AB (ref 6–20)
CHLORIDE: 97 mmol/L — AB (ref 101–111)
CO2: 17 mmol/L — ABNORMAL LOW (ref 22–32)
Calcium: 7.2 mg/dL — ABNORMAL LOW (ref 8.9–10.3)
Creatinine, Ser: 6.49 mg/dL — ABNORMAL HIGH (ref 0.44–1.00)
GFR, EST AFRICAN AMERICAN: 9 mL/min — AB (ref >60)
GFR, EST NON AFRICAN AMERICAN: 7 mL/min — AB (ref >60)
Glucose, Bld: 80 mg/dL (ref 65–99)
POTASSIUM: 5.1 mmol/L (ref 3.5–5.1)
SODIUM: 137 mmol/L (ref 135–145)

## 2017-02-14 LAB — POCT I-STAT 3, ART BLOOD GAS (G3+)
ACID-BASE DEFICIT: 7 mmol/L — AB (ref 0.0–2.0)
BICARBONATE: 19.6 mmol/L — AB (ref 20.0–28.0)
O2 Saturation: 86 %
PH ART: 7.28 — AB (ref 7.350–7.450)
TCO2: 21 mmol/L (ref 0–100)
pCO2 arterial: 40.8 mmHg (ref 32.0–48.0)
pO2, Arterial: 52 mmHg — ABNORMAL LOW (ref 83.0–108.0)

## 2017-02-14 LAB — URINALYSIS, COMPLETE (UACMP) WITH MICROSCOPIC
BILIRUBIN URINE: NEGATIVE
GLUCOSE, UA: NEGATIVE mg/dL
KETONES UR: NEGATIVE mg/dL
NITRITE: NEGATIVE
PH: 5 (ref 5.0–8.0)
Protein, ur: 30 mg/dL — AB
Specific Gravity, Urine: 1.015 (ref 1.005–1.030)

## 2017-02-14 LAB — CBC WITH DIFFERENTIAL/PLATELET
BASOS PCT: 0 %
Basophils Absolute: 0 10*3/uL (ref 0.0–0.1)
EOS ABS: 0.2 10*3/uL (ref 0.0–0.7)
EOS PCT: 1 %
HCT: 30.8 % — ABNORMAL LOW (ref 36.0–46.0)
Hemoglobin: 10 g/dL — ABNORMAL LOW (ref 12.0–15.0)
Lymphocytes Relative: 5 %
Lymphs Abs: 0.9 10*3/uL (ref 0.7–4.0)
MCH: 30 pg (ref 26.0–34.0)
MCHC: 32.5 g/dL (ref 30.0–36.0)
MCV: 92.5 fL (ref 78.0–100.0)
MONO ABS: 0.5 10*3/uL (ref 0.1–1.0)
MONOS PCT: 3 %
NEUTROS PCT: 91 %
Neutro Abs: 17.2 10*3/uL — ABNORMAL HIGH (ref 1.7–7.7)
PLATELETS: 167 10*3/uL (ref 150–400)
RBC: 3.33 MIL/uL — ABNORMAL LOW (ref 3.87–5.11)
RDW: 16.6 % — AB (ref 11.5–15.5)
WBC: 18.9 10*3/uL — ABNORMAL HIGH (ref 4.0–10.5)

## 2017-02-14 LAB — GLUCOSE, CAPILLARY
GLUCOSE-CAPILLARY: 136 mg/dL — AB (ref 65–99)
GLUCOSE-CAPILLARY: 79 mg/dL (ref 65–99)
Glucose-Capillary: 97 mg/dL (ref 65–99)
Glucose-Capillary: 79 mg/dL (ref 65–99)

## 2017-02-14 LAB — LACTIC ACID, PLASMA
LACTIC ACID, VENOUS: 4.6 mmol/L — AB (ref 0.5–1.9)
Lactic Acid, Venous: 1.1 mmol/L (ref 0.5–1.9)
Lactic Acid, Venous: 3.4 mmol/L (ref 0.5–1.9)

## 2017-02-14 LAB — PROTIME-INR
INR: 1.46
PROTHROMBIN TIME: 17.9 s — AB (ref 11.4–15.2)

## 2017-02-14 LAB — MRSA PCR SCREENING: MRSA BY PCR: NEGATIVE

## 2017-02-14 LAB — APTT: APTT: 39 s — AB (ref 24–36)

## 2017-02-14 LAB — TROPONIN I: TROPONIN I: 0.06 ng/mL — AB (ref <0.03)

## 2017-02-14 LAB — HCG, QUANTITATIVE, PREGNANCY: HCG, BETA CHAIN, QUANT, S: 3 m[IU]/mL (ref <5)

## 2017-02-14 LAB — I-STAT CG4 LACTIC ACID, ED: LACTIC ACID, VENOUS: 1.45 mmol/L (ref 0.5–1.9)

## 2017-02-14 LAB — LIPASE, BLOOD: LIPASE: 15 U/L (ref 11–51)

## 2017-02-14 MED ORDER — SODIUM CHLORIDE 0.9 % IV SOLN
INTRAVENOUS | Status: DC
  Administered 2017-02-14 – 2017-02-17 (×2): via INTRAVENOUS
  Administered 2017-02-20: 10 mL via INTRAVENOUS

## 2017-02-14 MED ORDER — HEPARIN SODIUM (PORCINE) 5000 UNIT/ML IJ SOLN
5000.0000 [IU] | Freq: Three times a day (TID) | INTRAMUSCULAR | Status: DC
  Administered 2017-02-14 – 2017-04-30 (×203): 5000 [IU] via SUBCUTANEOUS
  Filled 2017-02-14 (×200): qty 1

## 2017-02-14 MED ORDER — PREGABALIN 50 MG PO CAPS: 50.0000 mg | ORAL_CAPSULE | Freq: Three times a day (TID) | ORAL | Status: DC

## 2017-02-14 MED ORDER — HYDROMORPHONE HCL 1 MG/ML IJ SOLN
1.0000 mg | Freq: Once | INTRAMUSCULAR | Status: AC
  Administered 2017-02-14: 1 mg via INTRAVENOUS
  Filled 2017-02-14: qty 1

## 2017-02-14 MED ORDER — NOREPINEPHRINE BITARTRATE 1 MG/ML IV SOLN
0.0000 ug/min | INTRAVENOUS | Status: DC
  Administered 2017-02-14: 20 ug/min via INTRAVENOUS
  Administered 2017-02-18: 5 ug/min via INTRAVENOUS
  Filled 2017-02-14 (×2): qty 16

## 2017-02-14 MED ORDER — INSULIN ASPART 100 UNIT/ML ~~LOC~~ SOLN
0.0000 [IU] | SUBCUTANEOUS | Status: DC
  Administered 2017-02-14 – 2017-02-18 (×4): 1 [IU] via SUBCUTANEOUS
  Administered 2017-02-19: 2 [IU] via SUBCUTANEOUS
  Administered 2017-02-19 (×4): 1 [IU] via SUBCUTANEOUS
  Administered 2017-02-20 (×2): 2 [IU] via SUBCUTANEOUS
  Administered 2017-02-20: 1 [IU] via SUBCUTANEOUS
  Administered 2017-02-20 – 2017-02-22 (×11): 2 [IU] via SUBCUTANEOUS
  Administered 2017-02-22: 3 [IU] via SUBCUTANEOUS

## 2017-02-14 MED ORDER — ALBUTEROL SULFATE (2.5 MG/3ML) 0.083% IN NEBU: 2.5000 mg | INHALATION_SOLUTION | Freq: Four times a day (QID) | RESPIRATORY_TRACT | Status: DC | PRN

## 2017-02-14 MED ORDER — CLINDAMYCIN PHOSPHATE 900 MG/50ML IV SOLN
900.0000 mg | Freq: Once | INTRAVENOUS | Status: DC
  Administered 2017-02-14: 900 mg via INTRAVENOUS
  Filled 2017-02-14: qty 50

## 2017-02-14 MED ORDER — ONDANSETRON HCL 4 MG/2ML IJ SOLN
4.0000 mg | Freq: Once | INTRAMUSCULAR | Status: AC
  Administered 2017-02-14: 4 mg via INTRAVENOUS
  Filled 2017-02-14: qty 2

## 2017-02-14 MED ORDER — CALCITRIOL 0.25 MCG PO CAPS: 0.7500 ug | ORAL_CAPSULE | ORAL | Status: DC

## 2017-02-14 MED ORDER — CLINDAMYCIN PHOSPHATE 900 MG/50ML IV SOLN
900.0000 mg | Freq: Three times a day (TID) | INTRAVENOUS | Status: DC
  Filled 2017-02-14 (×2): qty 50

## 2017-02-14 MED ORDER — INSULIN GLARGINE 100 UNIT/ML ~~LOC~~ SOLN
7.0000 [IU] | Freq: Every day | SUBCUTANEOUS | Status: DC
  Filled 2017-02-14: qty 0.07

## 2017-02-14 MED ORDER — FAMOTIDINE IN NACL 20-0.9 MG/50ML-% IV SOLN
20.0000 mg | Freq: Every day | INTRAVENOUS | Status: DC
  Administered 2017-02-14 – 2017-02-20 (×7): 20 mg via INTRAVENOUS
  Filled 2017-02-14 (×7): qty 50

## 2017-02-14 MED ORDER — FENTANYL 2500MCG IN NS 250ML (10MCG/ML) PREMIX INFUSION
25.0000 ug/h | INTRAVENOUS | Status: DC
  Administered 2017-02-14: 50 ug/h via INTRAVENOUS
  Administered 2017-02-15: 150 ug/h via INTRAVENOUS
  Filled 2017-02-14 (×2): qty 250

## 2017-02-14 MED ORDER — FENTANYL CITRATE (PF) 100 MCG/2ML IJ SOLN
50.0000 ug | INTRAMUSCULAR | Status: DC | PRN
  Administered 2017-02-14: 50 ug via NASAL

## 2017-02-14 MED ORDER — ONDANSETRON HCL 4 MG PO TABS: 4.0000 mg | ORAL_TABLET | Freq: Four times a day (QID) | ORAL | Status: DC | PRN

## 2017-02-14 MED ORDER — ATORVASTATIN CALCIUM 40 MG PO TABS: 40.0000 mg | ORAL_TABLET | Freq: Every day | ORAL | Status: DC

## 2017-02-14 MED ORDER — FENTANYL BOLUS VIA INFUSION
25.0000 ug | INTRAVENOUS | Status: DC | PRN
  Filled 2017-02-14: qty 25

## 2017-02-14 MED ORDER — SEVELAMER CARBONATE 800 MG PO TABS
3200.0000 mg | ORAL_TABLET | Freq: Three times a day (TID) | ORAL | Status: DC
  Filled 2017-02-14: qty 4

## 2017-02-14 MED ORDER — HYDROMORPHONE HCL 1 MG/ML IJ SOLN
1.0000 mg | INTRAMUSCULAR | Status: DC | PRN
  Administered 2017-02-14: 1 mg via INTRAVENOUS
  Filled 2017-02-14: qty 1

## 2017-02-14 MED ORDER — INSULIN ASPART 100 UNIT/ML ~~LOC~~ SOLN
0.0000 [IU] | Freq: Three times a day (TID) | SUBCUTANEOUS | Status: DC
Start: 2017-02-14 — End: 2017-02-14

## 2017-02-14 MED ORDER — PROPOFOL 1000 MG/100ML IV EMUL
5.0000 ug/kg/min | INTRAVENOUS | Status: DC
  Administered 2017-02-15 (×2): 25 ug/kg/min via INTRAVENOUS
  Administered 2017-02-15: 5 ug/kg/min via INTRAVENOUS
  Administered 2017-02-15: 25 ug/kg/min via INTRAVENOUS
  Administered 2017-02-16: 20 ug/kg/min via INTRAVENOUS
  Administered 2017-02-16: 25 ug/kg/min via INTRAVENOUS
  Filled 2017-02-14 (×6): qty 100

## 2017-02-14 MED ORDER — DARBEPOETIN ALFA 100 MCG/0.5ML IJ SOSY: 100.0000 ug | PREFILLED_SYRINGE | INTRAMUSCULAR | Status: DC

## 2017-02-14 MED ORDER — DULOXETINE HCL 30 MG PO CPEP: 30.0000 mg | ORAL_CAPSULE | Freq: Every day | ORAL | Status: DC

## 2017-02-14 MED ORDER — BUDESONIDE 0.25 MG/2ML IN SUSP: 0.2500 mg | Freq: Two times a day (BID) | RESPIRATORY_TRACT | Status: DC | PRN

## 2017-02-14 MED ORDER — OXYCODONE HCL 5 MG PO TABS: 5.0000 mg | ORAL_TABLET | Freq: Two times a day (BID) | ORAL | Status: DC | PRN

## 2017-02-14 MED ORDER — MIDAZOLAM HCL 2 MG/2ML IJ SOLN
1.0000 mg | INTRAMUSCULAR | Status: DC | PRN
  Administered 2017-02-14 – 2017-02-18 (×2): 2 mg via INTRAVENOUS
  Administered 2017-02-20 (×2): 1 mg via INTRAVENOUS
  Filled 2017-02-14 (×3): qty 2

## 2017-02-14 MED ORDER — HYDROXYZINE HCL 25 MG PO TABS
25.0000 mg | ORAL_TABLET | Freq: Four times a day (QID) | ORAL | Status: DC
  Administered 2017-02-14: 25 mg via ORAL
  Filled 2017-02-14: qty 1

## 2017-02-14 MED ORDER — CLINDAMYCIN PHOSPHATE 600 MG/50ML IV SOLN
600.0000 mg | Freq: Once | INTRAVENOUS | Status: AC
  Administered 2017-02-14: 600 mg via INTRAVENOUS
  Filled 2017-02-14: qty 50

## 2017-02-14 MED ORDER — VANCOMYCIN HCL IN DEXTROSE 1-5 GM/200ML-% IV SOLN
1000.0000 mg | INTRAVENOUS | Status: AC
  Administered 2017-02-17 – 2017-02-19 (×2): 1000 mg via INTRAVENOUS
  Filled 2017-02-14 (×3): qty 200

## 2017-02-14 MED ORDER — CARVEDILOL 25 MG PO TABS: 25.0000 mg | ORAL_TABLET | Freq: Two times a day (BID) | ORAL | Status: DC

## 2017-02-14 MED ORDER — NOREPINEPHRINE BITARTRATE 1 MG/ML IV SOLN
0.0000 ug/min | INTRAVENOUS | Status: DC
  Administered 2017-02-14: 5 ug/min via INTRAVENOUS
  Filled 2017-02-14: qty 4

## 2017-02-14 MED ORDER — CLINDAMYCIN PHOSPHATE 900 MG/50ML IV SOLN
900.0000 mg | Freq: Three times a day (TID) | INTRAVENOUS | Status: DC
  Administered 2017-02-15 – 2017-02-16 (×4): 900 mg via INTRAVENOUS
  Filled 2017-02-14 (×6): qty 50

## 2017-02-14 MED ORDER — VANCOMYCIN HCL 10 G IV SOLR
2000.0000 mg | Freq: Once | INTRAVENOUS | Status: AC
  Administered 2017-02-14: 2000 mg via INTRAVENOUS
  Filled 2017-02-14: qty 2000

## 2017-02-14 MED ORDER — SEVELAMER CARBONATE 800 MG PO TABS
3200.0000 mg | ORAL_TABLET | Freq: Three times a day (TID) | ORAL | Status: DC
  Administered 2017-02-14: 3200 mg via ORAL
  Filled 2017-02-14: qty 4

## 2017-02-14 MED ORDER — FENTANYL CITRATE (PF) 100 MCG/2ML IJ SOLN
INTRAMUSCULAR | Status: AC
  Filled 2017-02-14: qty 2

## 2017-02-14 MED ORDER — ISOSORBIDE MONONITRATE 20 MG PO TABS
20.0000 mg | ORAL_TABLET | Freq: Three times a day (TID) | ORAL | Status: DC
  Filled 2017-02-14 (×3): qty 1

## 2017-02-14 MED ORDER — OXYCODONE-ACETAMINOPHEN 5-325 MG PO TABS
1.0000 | ORAL_TABLET | ORAL | Status: DC | PRN
  Administered 2017-02-14: 1 via ORAL
  Filled 2017-02-14: qty 1

## 2017-02-14 MED ORDER — FENTANYL CITRATE (PF) 100 MCG/2ML IJ SOLN: 50.0000 ug | Freq: Once | INTRAMUSCULAR | Status: DC

## 2017-02-14 MED ORDER — ORAL CARE MOUTH RINSE
15.0000 mL | OROMUCOSAL | Status: DC
  Administered 2017-02-14 – 2017-02-27 (×120): 15 mL via OROMUCOSAL

## 2017-02-14 MED ORDER — ALBUTEROL SULFATE (2.5 MG/3ML) 0.083% IN NEBU
2.5000 mg | INHALATION_SOLUTION | Freq: Four times a day (QID) | RESPIRATORY_TRACT | Status: DC | PRN
  Administered 2017-02-27 – 2017-03-16 (×2): 2.5 mg via RESPIRATORY_TRACT
  Filled 2017-02-14 (×2): qty 3

## 2017-02-14 MED ORDER — MIDAZOLAM HCL 2 MG/2ML IJ SOLN
1.0000 mg | INTRAMUSCULAR | Status: DC | PRN
  Administered 2017-02-14: 1 mg via INTRAVENOUS
  Filled 2017-02-14 (×2): qty 2

## 2017-02-14 MED ORDER — CHLORHEXIDINE GLUCONATE 0.12% ORAL RINSE (MEDLINE KIT)
15.0000 mL | Freq: Two times a day (BID) | OROMUCOSAL | Status: DC
  Administered 2017-02-14 – 2017-02-27 (×26): 15 mL via OROMUCOSAL

## 2017-02-14 MED ORDER — PIPERACILLIN-TAZOBACTAM 3.375 G IVPB 30 MIN
3.3750 g | Freq: Two times a day (BID) | INTRAVENOUS | Status: DC
  Administered 2017-02-14 – 2017-02-17 (×6): 3.375 g via INTRAVENOUS
  Filled 2017-02-14 (×6): qty 50

## 2017-02-14 MED ORDER — SODIUM CHLORIDE 0.9 % IV SOLN
INTRAVENOUS | Status: DC
  Administered 2017-02-14 – 2017-03-16 (×6): via INTRAVENOUS

## 2017-02-14 MED ORDER — ONDANSETRON HCL 4 MG/2ML IJ SOLN: 4.0000 mg | Freq: Four times a day (QID) | INTRAMUSCULAR | Status: DC | PRN

## 2017-02-14 MED ORDER — HEPARIN SODIUM (PORCINE) 5000 UNIT/ML IJ SOLN: 5000.0000 [IU] | Freq: Three times a day (TID) | INTRAMUSCULAR | Status: DC

## 2017-02-14 MED ORDER — RENA-VITE PO TABS: 1.0000 | ORAL_TABLET | Freq: Every day | ORAL | Status: DC

## 2017-02-14 NOTE — Progress Notes (Signed)
Patient was found unresponsive in room. Pulse checked at 1646, compressions started at 1646.  Epinephrine given at 1647. Fluids started at 999. Compressions continued.  Bicarb pushed at 1649. Intubated at 1651. Pulse check at 1651. Compressions continued.  1653 has pulse. Bi carb pushed at 1655.  BP 155/82, HR 100.

## 2017-02-14 NOTE — Progress Notes (Signed)
eLink Physician-Brief Progress Note Patient Name: [name] DOB: 1980/11/25 MRN: [id number]   Date of Service  02/14/2017  HPI/Events of Note  ABG  On 100%/PRVC 24/TV 520/P 5 = 7.28/40.8/52. 0/21.  eICU Interventions  Will order: 1. Increase PRVC rate to 30 and incrase PEEP to 8  2. Repeat ABG at 11 PM.      Intervention Category Major Interventions: Acid-Base disturbance - evaluation and management;Respiratory failure - evaluation and management  Lenell Antu 02/14/2017, 9:42 PM

## 2017-02-14 NOTE — ED Notes (Signed)
Patient transported to X-ray 

## 2017-02-14 NOTE — ED Triage Notes (Signed)
Pt reports increased left leg pain starting last night.  She has a partial amputation in Jan of this year and had further surgery on her foot in April.  Her leg is red, swollen and causing her great pain.  She also reports diarrhea starting today.  She was scheduled for Dialysis today but came here instead due to the pain.

## 2017-02-14 NOTE — ED Notes (Signed)
Pt given water to drink per Jon Gills, RN

## 2017-02-14 NOTE — Significant Event (Signed)
Rapid Response Event Note  Overview:  Code Blue page Time Called: 1646 Arrival Time: 1650 Event Type: Other (Comment)  Initial Focused Assessment:  Responded to Code [employer].  On my arrival to patients room ACLS in process.  As per Rn patient was found to be unresponsive and when placed on zoll was noted to be in asystole.     Interventions:  See Code Blue sheet  Plan of Care (if not transferred):  Primary Md updated  Event Summary:  Patient transferred to 2 h 20 via bed on zoll and 100% oxygen via ambu bag   at      at          [facility], Maryagnes Amos

## 2017-02-14 NOTE — Progress Notes (Signed)
Patient with an active order to cardiac monitoring. Secretary called around looking for lead sets for the tele box because the unit was short of lead sets.  Another patient's cardiac monitoring order was discontinued so I decided to use the tele set from that room. I came into the room and talked with CCMD to place the patient on cardiac monitoring.  As I was attempting to place the electrodes on the patient, I realized that she was unresponsive.  I am not certain how long the patient had been unresponsive as the lab tech had just drawn blood from the patient about 20 mins prior to my arrival in the room. I asked the patient's boyfriend how long she had been asleep and he said "about 2 hours". Code team activated and CPR started.

## 2017-02-14 NOTE — Progress Notes (Signed)
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  EEG is already ordered.   eICU Interventions  Will order: 1. Versed 1 mg IV Q 1 hour PRN seizures or anoxic myoclonus.      Intervention Category Major Interventions: Seizures - evaluation and management  Lenell Antu 02/14/2017, 7:33 PM

## 2017-02-14 NOTE — ED Provider Notes (Signed)
    Face-to-face evaluation   History: Patient presents for painful left foot, being managed for infection, as an outpatient.  Physical exam: Obese alert female in moderate pain.  Left foot with sutures in mid metatarsal region wound, wound dehiscence, moderate swelling and slight drainage, from the wound.  Mild erythema of the foot, without apparent ascending lymphangitis.  Medical screening examination/treatment/procedure(s) were conducted as a shared visit with non-physician practitioner(s) and myself.  I personally evaluated the patient during the encounter     Mancel Bale, MD 02/14/17 [phone]

## 2017-02-14 NOTE — Consult Note (Addendum)
[facility] Renal Consultation Note  Indication for Consultation:  Management of ESRD/hemodialysis; anemia, hypertension/volume and secondary hyperparathyroidism  HPI: [name] is a 36 y.o. female. ESRD 2/2  DM, started HD 03/11/16( OP TTS [facility] with some tx time noncompliance recently )Obesity, HTN, DM, COPD, NICM,  [facility] admit 9/22-9/28/17 osetomyelitis/ gangrene sp amputation R great/1st metatarsal ,S/p B toe amputations d/t ischemic toes (09/2016) now admitted with  painful Left foot with Osteomyelitis.Came  to ER secondary to pian in Left foot not relieved with  Percocet at home , and worsening foot swelling, increased drainage at the stump, and chills. No fever. She had been seen by Ortho this week and placed on Clindamycin. At the time, she had a LE [place] neg for DVT. Her last HD was Thursday on schedule but stayed just 2hr and 2 hr 40 min last tues 2/2  foot issue. IN ER Left foot  x-ray. Interval postop changes. Indistinctness of residual third and fifth metatarsal fragments, suggesting osteomyelitis . Plans for HD today as dw her and attempt 4.5 l uf with her significant bilat swelling      Past Medical History:  Diagnosis Date  . Anemia   . Arthritis    "left hand" (09/15/2013)  . Asthma   . CHF (congestive heart failure) ([place])   . Chronic bronchitis ([place])    "just about q yr" (09/15/2013)  . Chronic kidney disease    "low kidney function" (09/15/2013) ,  T/Th/Sa  . COPD (chronic obstructive pulmonary disease) ([place])   . Coronary artery disease   . Hyperlipidemia   . Hypertension   . Migraine    "get them alot" (09/15/2013)  . Myocardial infarction [facility]) 04/2015   NSTEMI  . Normal coronary arteries    by cardiac catheterization 09/20/13  . Peripheral vascular disease ([place])   . Pneumonia    "couple times; have it now" (09/15/2013)  . PONV (postoperative nausea and vomiting)   . Restless legs   . Shortness of breath    "just recently; related to the pneumonia"  (09/15/2013)  . Type 1 diabetes mellitus ([place])    type 2    Past Surgical History:  Procedure Laterality Date  . AMPUTATION Right 06/25/2016   Procedure: Amputation Right Great Toe at the Metatarsophalangeal Joint;  Surgeon: Newt Minion, MD;  Location: [place];  Service: Orthopedics;  Laterality: Right;  . AMPUTATION Bilateral 10/08/2016   Procedure: Bilateral Transmetatarsal Amputation;  Surgeon: Newt Minion, MD;  Location: [place];  Service: Orthopedics;  Laterality: Bilateral;  . AV FISTULA PLACEMENT Left 03/27/2015   Procedure: CREATION RADIAL CEPHALIC ARTERIOVENOUS FISTULA;  Surgeon: Angelia Mould, MD;  Location: [place];  Service: Vascular;  Laterality: Left;  . AV FISTULA PLACEMENT Left 11/23/2015   Procedure:  LEFT ARM BASILIC VEIN TRANSPOSITION  ;  Surgeon: Angelia Mould, MD;  Location: [place];  Service: Vascular;  Laterality: Left;  . CESAREAN SECTION  1999; 2006  . CORONARY ANGIOGRAM  09/20/2013   Procedure: CORONARY ANGIOGRAM;  Surgeon: Lorretta Harp, MD;  Location: [facility] CATH LAB;  Service: Cardiovascular;;  . FINGER SURGERY Left 1985   3rd and 4th digits reconstructed after cut off" (09/15/2013)  . PERIPHERAL VASCULAR CATHETERIZATION N/A 09/05/2016   Procedure: Abdominal Aortogram;  Surgeon: Elam Dutch, MD;  Location: [place] CV LAB;  Service: Cardiovascular;  Laterality: N/A;  . PERIPHERAL VASCULAR CATHETERIZATION Bilateral 09/05/2016   Procedure: Lower Extremity Angiography;  Surgeon: Elam Dutch, MD;  Location: [place] CV LAB;  Service: Cardiovascular;  Laterality: Bilateral;  . PERIPHERAL VASCULAR CATHETERIZATION Right 09/05/2016   Procedure: Peripheral Vascular Balloon Angioplasty;  Surgeon: Elam Dutch, MD;  Location: [place] CV LAB;  Service: Cardiovascular;  Laterality: Right;  peroneal and AT  . SHOULDER ARTHROSCOPY WITH BICEPSTENOTOMY Right 05/10/2015   Procedure: RIGHT SHOULDER ARTHROSCOPY WITH BICEPS TENOTOMY, DEBRIDEMENT LABRAL  TEAR;  Surgeon: Tania Ade, MD;  Location: [place];  Service: Orthopedics;  Laterality: Right;  Right shoulder arthroscopy biceps tenotomy, debridement labral tear  . STUMP REVISION Left 01/21/2017   Procedure: . Revision Left Transmetatarsal Amputation;  Surgeon: Newt Minion, MD;  Location: [place];  Service: Orthopedics;  Laterality: Left;  . TONSILLECTOMY  1997  . TUBAL LIGATION  2006      Family History  Problem Relation Age of Onset  . Diabetes Mother   . Stroke Mother   . Hypertension Father   . Hyperlipidemia Father   . Cancer - Lung Father   . Diabetes Maternal Grandmother   . Cancer Paternal Grandmother       reports that she has never smoked. She has never used smokeless tobacco. She reports that she does not drink alcohol or use drugs.   Allergies  Allergen Reactions  . Aspirin Anaphylaxis  . Sulfur Hives  . Tramadol Hives and Other (See Comments)    Pt states she feels weird   . Gabapentin Other (See Comments)    Lethargic     Prior to Admission medications   Medication Sig Start Date End Date Taking? Authorizing Provider  albuterol (PROVENTIL HFA;VENTOLIN HFA) 108 (90 BASE) MCG/ACT inhaler Inhale 2 puffs into the lungs every 6 (six) hours as needed for wheezing or shortness of breath.    Yes [provider]  albuterol (PROVENTIL) (2.5 MG/3ML) 0.083% nebulizer solution Take 2.5 mg by nebulization every 6 (six) hours as needed for wheezing or shortness of breath.   Yes [provider]  atorvastatin (LIPITOR) 40 MG tablet Take 40 mg by mouth at bedtime.   Yes [provider]  budesonide (PULMICORT) 0.25 MG/2ML nebulizer solution Take 0.25 mg by nebulization 2 (two) times daily as needed (shortness of breath or wheezing).   Yes [provider]  carvedilol (COREG) 25 MG tablet Take 25 mg by mouth 2 (two) times daily.   Yes [provider]  clindamycin (CLEOCIN) 300 MG capsule Take 600 mg by mouth 3 (three) times daily.    Yes [provider]  collagenase (SANTYL) ointment Apply 1 application topically daily. Patient taking differently: Apply 1 application topically daily as needed (itching).  11/21/16  Yes Suzan Slick, NP  diphenhydrAMINE (BENADRYL) 25 MG tablet Take 25 mg by mouth every 6 (six) hours as needed for itching.   Yes [provider]  DULoxetine (CYMBALTA) 30 MG capsule Take 1 capsule (30 mg total) by mouth daily. Qam 07/21/16  Yes Newt Minion, MD  hydrOXYzine (ATARAX/VISTARIL) 25 MG tablet Take 1 tablet (25 mg total) by mouth every 6 (six) hours. 03/30/16  Yes Tanna Furry, MD  insulin aspart (NOVOLOG FLEXPEN) 100 UNIT/ML FlexPen Inject 2-15 Units into the skin 3 (three) times daily with meals. Per sliding scale - based on carb count and CBG    Yes [provider]  Insulin Glargine (LANTUS SOLOSTAR) 100 UNIT/ML Solostar Pen Inject 10 Units into the skin at bedtime. Patient taking differently: Inject 15 Units into the skin at bedtime.  06/26/16  Yes Abrol,  Ascencion Dike, MD  isosorbide mononitrate (ISMO,MONOKET) 20 MG tablet Take 20 mg by mouth 3 (three) times daily.   Yes [provider]  meclizine (ANTIVERT) 25 MG tablet Take 25 mg by mouth 3 (three) times daily as needed for dizziness or nausea.   Yes [provider]  multivitamin (RENA-VIT) TABS tablet Take 1 tablet by mouth daily.   Yes [provider]  oxyCODONE (ROXICODONE) 5 MG immediate release tablet Take 1 tablet (5 mg total) by mouth 2 (two) times daily as needed for breakthrough pain. 12/24/16  Yes Suzan Slick, NP  oxyCODONE-acetaminophen (ROXICET) 5-325 MG tablet Take 1 tablet by mouth every 4 (four) hours as needed for severe pain. 01/21/17  Yes Newt Minion, MD  pregabalin (LYRICA) 50 MG capsule Take 1 capsule (50 mg total) by mouth 3 (three) times daily. 07/21/16  Yes Suzan Slick, NP  sevelamer carbonate (RENVELA) 800 MG tablet Take 3,200-4,000 mg by mouth 3 (three) times daily with  meals.    Yes [provider]  tiZANidine (ZANAFLEX) 4 MG tablet Take 4 mg by mouth 3 (three) times daily as needed for muscle spasms.   Yes [provider]     Anti-infectives    Start     Dose/Rate Route Frequency Ordered Stop   02/14/17 2200  clindamycin (CLEOCIN) IVPB 900 mg     900 mg 100 mL/hr over 30 Minutes Intravenous Every 8 hours 02/14/17 1106     02/14/17 1115  clindamycin (CLEOCIN) IVPB 900 mg     900 mg 100 mL/hr over 30 Minutes Intravenous  Once 02/14/17 1106     02/14/17 0945  clindamycin (CLEOCIN) IVPB 600 mg     600 mg 100 mL/hr over 30 Minutes Intravenous  Once 02/14/17 0931 02/14/17 1013       Results for orders placed or performed during the hospital encounter of 02/14/17 (from the past 48 hour(s))  Comprehensive metabolic panel     Status: Abnormal   Collection Time: 02/14/17  5:26 AM  Result Value Ref Range   Sodium 137 135 - 145 mmol/L   Potassium 4.2 3.5 - 5.1 mmol/L   Chloride 94 (L) 101 - 111 mmol/L   CO2 22 22 - 32 mmol/L   Glucose, Bld 79 65 - 99 mg/dL   BUN 94 (H) 6 - 20 mg/dL   Creatinine, Ser 5.98 (H) 0.44 - 1.00 mg/dL   Calcium 7.7 (L) 8.9 - 10.3 mg/dL   Total Protein 6.6 6.5 - 8.1 g/dL   Albumin 2.6 (L) 3.5 - 5.0 g/dL   AST 17 15 - 41 U/L   ALT 21 14 - 54 U/L   Alkaline Phosphatase 213 (H) 38 - 126 U/L   Total Bilirubin 3.8 (H) 0.3 - 1.2 mg/dL   GFR calc non Af Amer 8 (L) >60 mL/min   GFR calc Af Amer 9 (L) >60 mL/min    Comment: (NOTE) The eGFR has been calculated using the CKD EPI equation. This calculation has not been validated in all clinical situations. eGFR's persistently <60 mL/min signify possible Chronic Kidney Disease.    Anion gap 21 (H) 5 - 15  CBC with Differential     Status: Abnormal   Collection Time: 02/14/17  5:26 AM  Result Value Ref Range   WBC 18.9 (H) 4.0 - 10.5 K/uL   RBC 3.33 (L) 3.87 - 5.11 MIL/uL   Hemoglobin 10.0 (L) 12.0 - 15.0 g/dL   HCT 30.8 (L) 36.0 - 46.0 %  MCV 92.5 78.0 - 100.0  fL   MCH 30.0 26.0 - 34.0 pg   MCHC 32.5 30.0 - 36.0 g/dL   RDW 16.6 (H) 11.5 - 15.5 %   Platelets 167 150 - 400 K/uL   Neutrophils Relative % 91 %   Neutro Abs 17.2 (H) 1.7 - 7.7 K/uL   Lymphocytes Relative 5 %   Lymphs Abs 0.9 0.7 - 4.0 K/uL   Monocytes Relative 3 %   Monocytes Absolute 0.5 0.1 - 1.0 K/uL   Eosinophils Relative 1 %   Eosinophils Absolute 0.2 0.0 - 0.7 K/uL   Basophils Relative 0 %   Basophils Absolute 0.0 0.0 - 0.1 K/uL  Lipase, blood     Status: None   Collection Time: 02/14/17  5:26 AM  Result Value Ref Range   Lipase 15 11 - 51 U/L  hCG, quantitative, pregnancy     Status: None   Collection Time: 02/14/17  5:26 AM  Result Value Ref Range   hCG, Beta Chain, Quant, S 3 <5 mIU/mL    Comment:          GEST. AGE      CONC.  (mIU/mL)   <=1 WEEK        5 - 50     2 WEEKS       50 - 500     3 WEEKS       100 - 10,000     4 WEEKS     1,000 - 30,000     5 WEEKS     3,500 - 115,000   6-8 WEEKS     12,000 - 270,000    12 WEEKS     15,000 - 220,000        FEMALE AND NON-PREGNANT FEMALE:     LESS THAN 5 mIU/mL   I-Stat CG4 Lactic Acid, ED     Status: None   Collection Time: 02/14/17  8:39 AM  Result Value Ref Range   Lactic Acid, Venous 1.45 0.5 - 1.9 mmol/L     ROS: see hpi  For positives   Physical Exam: Vitals:   02/14/17 0945 02/14/17 1100  BP: 111/69 102/69  Pulse: 97 100  Resp: 15 18  Temp:       General: Morbid obese AAF  chronically ill appearing co l foot discomfort NAD HEENT: [place] , EOMI, MMM Neck: supple Heart: RRR distant no m, r,g appreciated  Lungs: Decr bs base but clear, Unlabored Breathing Abdomen: morbid obese ,soft , NT, ND Extremities: Bilat pedal edema  3+ L>R 2+, L foot bandaged not unwrapped /R  Skin: L foot erythema surrounding bandage /chronic pock marks/ R foot  amputation metatarsal  Neuro: alert Ox3, moves all extrem , no overt acute deficits  Dialysis Access:LUA AVF pos bruit   Dialysis Orders: Center: [facility]  On TueThuSat,  4 hrs 15 min, Optiflux 200 EDW 114 (kg), 2.0 K, 2.0 Ca, AVF LUA  Iron Sucrose (Venofer) 50 mg IVP During Dialysis 1X Week  Mircera 75 mcg IV q  2 weeks ( last on 02/05/17) Vitamin D (Calcitriol) Oral 0.75 mcg  Po q hd    Assessment/Plan 1. L Foot Osteomyelitis = per admit TEAM / noted Orth Dr. Durward Fortes to consult 2. ESRD -  Hd TTS / HD today  3. Hypertension/volume  - Bp Lowish  2/2 pain meds ?infec/ Coreg 75m bid decr to 12.5 mg hold pre hd / Excess vol by Exam  Did not get  full HD tX  Time past 2 txs /attempt 4.5 l uf today ( avg 4.5 - 5 l uf q HD  With large  Vol gains with little insight.) 4. Anemia of ESRD= HGB 10.0 Next ESA due 02/19/17 /hold iron with infection this week 5. Metabolic bone disease -  Po vit d and Renvela as binder 6. Non-ischemic cardiomyopathy / History of combined systolic and diastolic heart failure/ history of NSTEMI - by echo 8/16- EF 35-40%= wt 108.2 kg below her edw BUT  BED WT. 7. DM type 2=-per admit 8. COPD= On nebs/ inhaler O2 prn at home  9. Morbid obesity =  Ernest Haber, PA-C [place] [phone] 02/14/2017, 11:42 AM   Pt seen, examined and agree w A/P as above.  ESRD pt admitted with drainage/ pain / swelling from L foot, xrays showed signs suggesting osteomyelitis. Pt admitted, given abx, then had asystole cardiac arrest around 5 pm.  Resuscitated, on vent.  Some small muscle facial twitching is noted.  On levo gtt at 20 ug/min.  Is 6 kg under dry wt but sig LE / pretib edema bilat, and CVP is 15 which is normal.  Plan is for gentle HD tonight to keep her on schedule.  If shock worsens may need CRRT but not needed now.  No fluid off, reduce IVF's to 40/hr.  Will follow.    Kelly Splinter MD [employer] pager [phone]   02/14/2017, 8:15 PM

## 2017-02-14 NOTE — Anesthesia Procedure Notes (Signed)
Procedure Name: Intubation Date/Time: 02/14/2017 5:00 PM Performed by: Little Ishikawa L Pre-anesthesia Checklist: Patient identified, Emergency Drugs available, Suction available, Patient being monitored and Timeout performed Patient Re-evaluated:Patient Re-evaluated prior to inductionOxygen Delivery Method: Ambu bag Preoxygenation: Pre-oxygenation with 100% oxygen Intubation Type: IV induction, Rapid sequence and Cricoid Pressure applied Ventilation: Mask ventilation without difficulty Laryngoscope Size: Glidescope and 4 Grade View: Grade I Tube type: Subglottic suction tube Tube size: 8.0 mm Number of attempts: 1 Airway Equipment and Method: Stylet and Video-laryngoscopy Placement Confirmation: ETT inserted through vocal cords under direct vision,  breath sounds checked- equal and bilateral and CO2 detector Secured at: 23 cm Tube secured with: Tape Dental Injury: Teeth and Oropharynx as per pre-operative assessment

## 2017-02-14 NOTE — Progress Notes (Signed)
Nurse and charge nurse attempted to do a skin assessment on the patient. Patient refused to take off her clothing.  States "I'm in too much pain". Nursing to continue to monitor.

## 2017-02-14 NOTE — Consult Note (Signed)
PULMONARY / CRITICAL CARE MEDICINE   Name: [name] MRN: [id number] DOB: [birth year]    ADMISSION DATE:  02/14/2017 CONSULTATION DATE:  02/14/17  REFERRING MD:  Feliz Beam MD  CHIEF COMPLAINT:  Sepsis, cardiac arrest  HISTORY OF PRESENT ILLNESS:   36 year old female w/ ESRD d/t poorly controlled DM, bilateral transmetatarsal foot amputations, obesity, HTN, medical non-compliance as well as NICM. Admitted 5/19 with complaints of left leg pain, found to have radiological evidence of osteomyelitis with wound nonhealing, dehiscence, purulent drainage from the wound. Admitted for IV antibiotics, hemodialysis and possible surgery.  Today afternoon she was found unresponsive in the room. She had chest compressions, epi bicarb given. She was intubated by anesthesia and had ROSC in approximately 9 minutes. Downtime prior to code is unknown. She got dilaudid in the afternoon for leg pain.  PAST MEDICAL HISTORY :  She  has a past medical history of Anemia; Arthritis; Asthma; CHF (congestive heart failure) (HCC); Chronic bronchitis (HCC); Chronic kidney disease; COPD (chronic obstructive pulmonary disease) (HCC); Coronary artery disease; Hyperlipidemia; Hypertension; Migraine; Myocardial infarction (HCC) (04/2015); Normal coronary arteries; Peripheral vascular disease (HCC); Pneumonia; PONV (postoperative nausea and vomiting); Restless legs; Shortness of breath; and Type 1 diabetes mellitus (HCC).  PAST SURGICAL HISTORY: She  has a past surgical history that includes Cesarean section (1999; 2006); Finger surgery (Left, 1985); Tonsillectomy (1997); Tubal ligation (2006); coronary angiogram (09/20/2013); AV fistula placement (Left, 03/27/2015); Shoulder arthroscopy with bicepstenotomy (Right, 05/10/2015); AV fistula placement (Left, 11/23/2015); Amputation (Right, 06/25/2016); Cardiac catheterization (N/A, 09/05/2016); Cardiac catheterization (Bilateral, 09/05/2016); Cardiac catheterization (Right, 09/05/2016);  Amputation (Bilateral, 10/08/2016); and Stump revision (Left, 01/21/2017).  Allergies  Allergen Reactions  . Aspirin Anaphylaxis  . Sulfur Hives  . Tramadol Hives and Other (See Comments)    Pt states she feels weird   . Gabapentin Other (See Comments)    Lethargic     No current facility-administered medications on file prior to encounter.    Current Outpatient Prescriptions on File Prior to Encounter  Medication Sig  . albuterol (PROVENTIL HFA;VENTOLIN HFA) 108 (90 BASE) MCG/ACT inhaler Inhale 2 puffs into the lungs every 6 (six) hours as needed for wheezing or shortness of breath.   Marland Kitchen albuterol (PROVENTIL) (2.5 MG/3ML) 0.083% nebulizer solution Take 2.5 mg by nebulization every 6 (six) hours as needed for wheezing or shortness of breath.  Marland Kitchen atorvastatin (LIPITOR) 40 MG tablet Take 40 mg by mouth at bedtime.  . budesonide (PULMICORT) 0.25 MG/2ML nebulizer solution Take 0.25 mg by nebulization 2 (two) times daily as needed (shortness of breath or wheezing).  . carvedilol (COREG) 25 MG tablet Take 25 mg by mouth 2 (two) times daily.  . collagenase (SANTYL) ointment Apply 1 application topically daily. (Patient taking differently: Apply 1 application topically daily as needed (itching). )  . diphenhydrAMINE (BENADRYL) 25 MG tablet Take 25 mg by mouth every 6 (six) hours as needed for itching.  . DULoxetine (CYMBALTA) 30 MG capsule Take 1 capsule (30 mg total) by mouth daily. Qam  . hydrOXYzine (ATARAX/VISTARIL) 25 MG tablet Take 1 tablet (25 mg total) by mouth every 6 (six) hours.  . insulin aspart (NOVOLOG FLEXPEN) 100 UNIT/ML FlexPen Inject 2-15 Units into the skin 3 (three) times daily with meals. Per sliding scale - based on carb count and CBG   . Insulin Glargine (LANTUS SOLOSTAR) 100 UNIT/ML Solostar Pen Inject 10 Units into the skin at bedtime. (Patient taking differently: Inject 15 Units into the skin at bedtime. )  .  isosorbide mononitrate (ISMO,MONOKET) 20 MG tablet Take 20 mg by  mouth 3 (three) times daily.  . multivitamin (RENA-VIT) TABS tablet Take 1 tablet by mouth daily.  Marland Kitchen oxyCODONE (ROXICODONE) 5 MG immediate release tablet Take 1 tablet (5 mg total) by mouth 2 (two) times daily as needed for breakthrough pain.  Marland Kitchen oxyCODONE-acetaminophen (ROXICET) 5-325 MG tablet Take 1 tablet by mouth every 4 (four) hours as needed for severe pain.  . pregabalin (LYRICA) 50 MG capsule Take 1 capsule (50 mg total) by mouth 3 (three) times daily.  . sevelamer carbonate (RENVELA) 800 MG tablet Take 3,200-4,000 mg by mouth 3 (three) times daily with meals.   Marland Kitchen tiZANidine (ZANAFLEX) 4 MG tablet Take 4 mg by mouth 3 (three) times daily as needed for muscle spasms.    FAMILY HISTORY:  Her indicated that her mother is alive. She indicated that her father is deceased. She indicated that her sister is alive. She indicated that both of her brothers are alive. She indicated that the status of her maternal grandmother is unknown. She indicated that the status of her paternal grandmother is unknown.    SOCIAL HISTORY: She  reports that she has never smoked. She has never used smokeless tobacco. She reports that she does not drink alcohol or use drugs.  REVIEW OF SYSTEMS:   Unable to obtain as patient is intubated  SUBJECTIVE:    VITAL SIGNS: BP (!) 102/52 (BP Location: Right Arm)   Pulse 97   Temp 97.7 F (36.5 C) (Oral)   Resp 20   Ht 5' 8.5" (1.74 m)   Wt 238 lb (108 kg)   SpO2 90%   BMI 35.66 kg/m   HEMODYNAMICS:    VENTILATOR SETTINGS:    INTAKE / OUTPUT: No intake/output data recorded.  PHYSICAL EXAMINATION: Gen:      No acute distress, obese, appears older than stated age HEENT:  EOMI, sclera anicteric, ET tube in place Neck:     No masses; no thyromegaly Lungs:    Clear to auscultation bilaterally; tachypneic CV:         Regular rate and rhythm; no murmurs Abd:      + bowel sounds; soft, non-tender; no palpable masses, no distension Ext:    2+ edema;  adequate peripheral perfusion, bilateral feet in bandages Skin:      Warm and dry; no rash Neuro: Sedated, unresponsive  LABS:  BMET  Recent Labs Lab 02/14/17 0526 02/14/17 1141  NA 137 134*  K 4.2 4.4  CL 94* 96*  CO2 22 19*  BUN 94* 97*  CREATININE 5.98* 6.23*  GLUCOSE 79 98    Electrolytes  Recent Labs Lab 02/14/17 0526 02/14/17 1141  CALCIUM 7.7* 7.3*    CBC  Recent Labs Lab 02/14/17 0526  WBC 18.9*  HGB 10.0*  HCT 30.8*  PLT 167    Coag's No results for input(s): APTT, INR in the last 168 hours.  Sepsis Markers  Recent Labs Lab 02/14/17 0839 02/14/17 1141  LATICACIDVEN 1.45 1.1    ABG No results for input(s): PHART, PCO2ART, PO2ART in the last 168 hours.  Liver Enzymes  Recent Labs Lab 02/14/17 0526 02/14/17 1141  AST 17 16  ALT 21 17  ALKPHOS 213* 181*  BILITOT 3.8* 4.1*  ALBUMIN 2.6* 2.5*    Cardiac Enzymes No results for input(s): TROPONINI, PROBNP in the last 168 hours.  Glucose  Recent Labs Lab 02/14/17 1242  GLUCAP 97    Imaging Dg Chest 2  View  Result Date: 02/14/2017 CLINICAL DATA:  Hypoxia. Pt came to ED with left foot pain post amputation of toes. Hx diabetes, PNA, COPD, HTN, MI, CAD, CKD, CHF. EXAM: CHEST  2 VIEW COMPARISON:  04/22/2016 FINDINGS: Moderate enlargement of the cardiopericardial silhouette, stable. No mediastinal or hilar masses. No evidence of adenopathy. Clear lungs.  No pleural effusion.  No pneumothorax. Skeletal structures are unremarkable. IMPRESSION: 1. No acute cardiopulmonary disease. 2. Stable moderate cardiomegaly. Electronically Signed   By: Amie Portland M.D.   On: 02/14/2017 09:26   Dg Foot Complete Left  Result Date: 02/14/2017 CLINICAL DATA:  Pt c/o severe pain and inflammation in left foot. 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 Weeping evident at site of amputation. Pt reports nausea today. Hx diabetes, HTN, CKD. EXAM: LEFT FOOT - COMPLETE 3+ VIEW COMPARISON:   12/29/2016 FINDINGS: Postop changes including resection of the proximal first and fourth metatarsals since prior exam. Small proximal fragments of the second third and fifth metatarsals are identified. There is some indistinctness of the resection margin of the third and fifth metatarsal fragments suggesting osteomyelitis. There is a soft tissue defect distal to the resection margin seen best on the lateral projection. IMPRESSION: 1. Interval postop changes. Indistinctness of residual third and fifth metatarsal fragments, suggesting osteomyelitis. Electronically Signed   By: Corlis Leak M.D.   On: 02/14/2017 08:15     STUDIES:    CULTURES: Bcx 5/19 X 2 >   ANTIBIOTICS: Clinidamycin 5/19 > Vanco 5/19 > Zosyn 5/19 >  SIGNIFICANT EVENTS: 5/19 - Admit, cardiac arrest, transfer to ICU  LINES/TUBES: Oett 5/19>> CVL 5/19>>  DISCUSSION: 36 year old with significant comorbidities admitted with left foot pain, osteomyelitis Status post cardiac arrest probably from sepsis. Need to rule out acute coronary syndrome. PE is possible but less likely. She had lower extremity Dopplers on 5/18 which are negative for DVT.  ASSESSMENT / PLAN:  PULMONARY A: Acute respiratory failure P:   Full vent support Check chest x-ray, ABG CT scan PE protocol when stable  CARDIOVASCULAR A:  Asystolic cardiac arrest P:  Echocardiogram, check troponins, EKG Fluid resuscitation. 1 L normal saline  RENAL A:   ESRD on HD P:   Nephrology on board We will likely need HD today  GASTROINTESTINAL A:   Stable, no issues P:   Keep nothing by mouth for now Pepcid for ulcer prophylaxis  HEMATOLOGIC A:   Leukocytosis from sepsis P:  Monitor CBC  INFECTIOUS A:   Left foot osteomyelitis P:   Continue clindamycin Start Vanco, Zosyn Follow cultures, pro calcitonin  ENDOCRINE A:   Diabetes mellitus P:   SSI coverage Hold lantus  NEUROLOGIC A:   Acute encephalopathy after cardiac arrest P:    RASS goal: 0 Fentanyl for sedation CT head Normothermia protocol  FAMILY  - Updates: Mother updated over phone. She is in [place] currently - Inter-disciplinary family meet or Palliative Care meeting due by: 02/22/16  The patient is critically ill with multiple organ system failure and requires high complexity decision making for assessment and support, frequent evaluation and titration of therapies, advanced monitoring, review of radiographic studies and interpretation of complex data.   Critical Care Time devoted to patient care services, exclusive of separately billable procedures, described in this note is 35 minutes.   Chilton Greathouse MD [facility] Pager [phone] If no answer or after 3pm call: [phone] 02/14/2017, 6:01 PM

## 2017-02-14 NOTE — Procedures (Signed)
Central Venous Catheter Insertion Procedure Note [name] [id number] [birth year]  Procedure: Insertion of Central Venous Catheter Indications: Assessment of intravascular volume, Drug and/or fluid administration and Frequent blood sampling  Procedure Details Consent: Risks of procedure as well as the alternatives and risks of each were explained to the (patient/caregiver).  Consent for procedure obtained. Time Out: Verified patient identification, verified procedure, site/side was marked, verified correct patient position, special equipment/implants available, medications/allergies/relevent history reviewed, required imaging and test results available.  Performed Real time [place] used to ID and cannulate vessel   Maximum sterile technique was used including antiseptics, cap, gloves, gown, hand hygiene, mask and sheet. Skin prep: Chlorhexidine; local anesthetic administered A antimicrobial bonded/coated triple lumen catheter was placed in the left internal jugular vein using the Seldinger technique.  Evaluation Blood flow good Complications: No apparent complications Patient did tolerate procedure well. Chest X-ray ordered to verify placement.  CXR: pending.  [name] 02/14/2017, 6:03 PM Simonne Martinet ACNP-BC [facility] Pulmonary/Critical Care Pager # [phone] OR # [phone] if no answer

## 2017-02-14 NOTE — Progress Notes (Addendum)
   Met w/ family outside patient room.  Chaplain available for follow up.  - Rev. Chaplain Carnella [place]

## 2017-02-14 NOTE — Progress Notes (Signed)
Pharmacy Antibiotic Note  [name] is a 36 y.o. female admitted on 02/14/2017 with diarrhea, chills and LLE drainage.  Pharmacy has been consulted to continue clindamycin dosing from PTA for foot infection.  Patient reported being on clindamycin for a couple of days and only had one episode of diarrhea this AM.   Plan: - Clindamycin 900mg  IV Q8H - Pharmacy will sign off as dosage adjustment is unnecessary.  Thank you for the consult! - Recommend monitoring closely for C.diff as patient presented with c/o of diarrhea and on clindamycin PTA.   Height: 5' 8.5" (174 cm) Weight: 238 lb (108 kg) IBW/kg (Calculated) : 65.05  Temp (24hrs), Avg:97.6 F (36.4 C), Min:97.4 F (36.3 C), Max:97.7 F (36.5 C)   Recent Labs Lab 02/14/17 0526 02/14/17 0839  WBC 18.9*  --   CREATININE 5.98*  --   LATICACIDVEN  --  1.45    Estimated Creatinine Clearance: 16.7 mL/min (A) (by C-G formula based on SCr of 5.98 mg/dL (H)).    Allergies  Allergen Reactions  . Aspirin Anaphylaxis  . Sulfur Hives  . Tramadol Hives and Other (See Comments)    Pt states she feels weird   . Gabapentin Other (See Comments)    Lethargic      Jentzen Minasyan D. Laney Potash, PharmD, BCPS Pager:  [phone] 02/14/2017, 10:59 AM

## 2017-02-14 NOTE — ED Notes (Signed)
Pt returned to room from xray.

## 2017-02-14 NOTE — H&P (Signed)
History and Physical    Steffie Waggoner RUE:[id number] DOB: 1981/02/03 DOA: 02/14/2017   PCP: [name], FNP   Patient coming from:  Home    Chief Complaint: L leg pain   HPI: Riti Rollyson is a 36 y.o. female  with a history of ESRD on HD T TH S (through a LUE AVF), COPD/ Asthma , CAD s/p MI 04/2015, HLD,  HTN, CHF, chronic anemia, DM,  and a history of L foot partial amputation by Dr., [name] in 09/2016 with revision in 12/2016 for dehiscence, presenting to the ED with worsening LLE pain, swelling, increased drainage at the stump, and chills. No fever. She had been seen by Ortho this week and placed on Clindamycin. At the time, she had a LE [place] neg for DVT. PAin is not well controled. Denies any pain on the right lower extremity. Symptoms are worse with palpation. Days no alleviating factors. She has been taking Percocet at home without relief. Denies shortness of breath or chest pain. Denies any abdominal pain nausea or vomiting. She had a couple of episodes of loose stools, after initiating clindamycin. No headaches or dizziness. No confusion is reported.   ED Course:  BP 111/69   Pulse 97   Temp 97.7 F (36.5 C) (Oral)   Resp 15   Ht 5' 8.5" (1.74 m)   Wt 108 kg (238 lb)   SpO2 100%   BMI 35.66 kg/m   Sodium  137 potassium 4.2 glucose 79, BUN 94 creatinine 5.98 alkaline phosphatase 213 lipase 15 is 17 ALT 21 protein 6.6 total bilirubin 3.8 (has chronic hyperbilirubinemia) GFR 7  lactic acid 1.45 WBC 18.9 hemoglobin 10 platelets 167 culture pending hCG negative Left foot  x-ray. Interval postop changes. Indistinctness of residual third and fifth metatarsal fragments, suggesting osteomyelitis  Review of Systems:  As per HPI otherwise all other systems reviewed and are negative  Past Medical History:  Diagnosis Date  . Anemia   . Arthritis    "left hand" (09/15/2013)  . Asthma   . CHF (congestive heart failure) (HCC)   . Chronic bronchitis (HCC)    "just about q yr" (09/15/2013)   . Chronic kidney disease    "low kidney function" (09/15/2013) ,  T/Th/Sa  . COPD (chronic obstructive pulmonary disease) (HCC)   . Coronary artery disease   . Hyperlipidemia   . Hypertension   . Migraine    "get them alot" (09/15/2013)  . Myocardial infarction [facility]) 04/2015   NSTEMI  . Normal coronary arteries    by cardiac catheterization 09/20/13  . Peripheral vascular disease (HCC)   . Pneumonia    "couple times; have it now" (09/15/2013)  . PONV (postoperative nausea and vomiting)   . Restless legs   . Shortness of breath    "just recently; related to the pneumonia" (09/15/2013)  . Type 1 diabetes mellitus (HCC)    type 2    Past Surgical History:  Procedure Laterality Date  . AMPUTATION Right 06/25/2016   Procedure: Amputation Right Great Toe at the Metatarsophalangeal Joint;  Surgeon: Nadara Mustard, MD;  Location: [facility] OR;  Service: Orthopedics;  Laterality: Right;  . AMPUTATION Bilateral 10/08/2016   Procedure: Bilateral Transmetatarsal Amputation;  Surgeon: Nadara Mustard, MD;  Location: [facility] OR;  Service: Orthopedics;  Laterality: Bilateral;  . AV FISTULA PLACEMENT Left 03/27/2015   Procedure: CREATION RADIAL CEPHALIC ARTERIOVENOUS FISTULA;  Surgeon: Chuck Hint, MD;  Location: [facility] OR;  Service: Vascular;  Laterality: Left;  .  AV FISTULA PLACEMENT Left 11/23/2015   Procedure:  LEFT ARM BASILIC VEIN TRANSPOSITION  ;  Surgeon: Chuck Hint, MD;  Location[facility] OR;  Service: Vascular;  Laterality: Left;  . CESAREAN SECTION  1999; 2006  . CORONARY ANGIOGRAM  09/20/2013   Procedure: CORONARY ANGIOGRAM;  Surgeon: Runell Gess, MD;  Location: [facility] CATH LAB;  Service: Cardiovascular;;  . FINGER SURGERY Left 1985   3rd and 4th digits reconstructed after cut off" (09/15/2013)  . PERIPHERAL VASCULAR CATHETERIZATION N/A 09/05/2016   Procedure: Abdominal Aortogram;  Surgeon: Sherren Kerns, MD;  Location: [facility] INVASIVE CV LAB;  Service: Cardiovascular;  Laterality: N/A;    . PERIPHERAL VASCULAR CATHETERIZATION Bilateral 09/05/2016   Procedure: Lower Extremity Angiography;  Surgeon: Sherren Kerns, MD;  Location: [facility] INVASIVE CV LAB;  Service: Cardiovascular;  Laterality: Bilateral;  . PERIPHERAL VASCULAR CATHETERIZATION Right 09/05/2016   Procedure: Peripheral Vascular Balloon Angioplasty;  Surgeon: Sherren Kerns, MD;  Location: [facility] INVASIVE CV LAB;  Service: Cardiovascular;  Laterality: Right;  peroneal and AT  . SHOULDER ARTHROSCOPY WITH BICEPSTENOTOMY Right 05/10/2015   Procedure: RIGHT SHOULDER ARTHROSCOPY WITH BICEPS TENOTOMY, DEBRIDEMENT LABRAL TEAR;  Surgeon: Jones Broom, MD;  Location: [facility] OR;  Service: Orthopedics;  Laterality: Right;  Right shoulder arthroscopy biceps tenotomy, debridement labral tear  . STUMP REVISION Left 01/21/2017   Procedure: . Revision Left Transmetatarsal Amputation;  Surgeon: Nadara Mustard, MD;  Location: [facility] OR;  Service: Orthopedics;  Laterality: Left;  . TONSILLECTOMY  1997  . TUBAL LIGATION  2006    Social History Social History   Social History  . Marital status: Single    Spouse name: N/A  . Number of children: 2  . Years of education: N/A   Occupational History  . Student    Social History Main Topics  . Smoking status: Never Smoker  . Smokeless tobacco: Never Used  . Alcohol use No  . Drug use: No  . Sexual activity: Not Currently    Partners: Male    Birth control/ protection: Other-see comments     Comment: S/P tubal ligation   Other Topics Concern  . Not on file   Social History Narrative   Single.  Moved here from [place].  Lives with 2 children.       Allergies  Allergen Reactions  . Aspirin Anaphylaxis  . Sulfur Hives  . Tramadol Hives and Other (See Comments)    Pt states she feels weird   . Gabapentin Other (See Comments)    Lethargic     Family History  Problem Relation Age of Onset  . Diabetes Mother   . Stroke Mother   . Hypertension Father   . Hyperlipidemia Father   .  Cancer - Lung Father   . Diabetes Maternal Grandmother   . Cancer Paternal Grandmother       Prior to Admission medications   Medication Sig Start Date End Date Taking? Authorizing Provider  albuterol (PROVENTIL HFA;VENTOLIN HFA) 108 (90 BASE) MCG/ACT inhaler Inhale 2 puffs into the lungs every 6 (six) hours as needed for wheezing or shortness of breath.     [provider]  albuterol (PROVENTIL) (2.5 MG/3ML) 0.083% nebulizer solution Take 2.5 mg by nebulization every 6 (six) hours as needed for wheezing or shortness of breath.    [provider]  atorvastatin (LIPITOR) 40 MG tablet Take 40 mg by mouth at bedtime.    [provider]  budesonide (PULMICORT) 0.25 MG/2ML nebulizer solution Take  0.25 mg by nebulization 2 (two) times daily as needed (shortness of breath or wheezing).    [provider]  carvedilol (COREG) 25 MG tablet Take 25 mg by mouth 2 (two) times daily.    [provider]  collagenase (SANTYL) ointment Apply 1 application topically daily. 11/21/16   Adonis Huguenin, NP  diphenhydrAMINE (BENADRYL) 25 MG tablet Take 25 mg by mouth every 6 (six) hours as needed for itching.    [provider]  DULoxetine (CYMBALTA) 30 MG capsule Take 1 capsule (30 mg total) by mouth daily. Qam 07/21/16   Nadara Mustard, MD  hydrOXYzine (ATARAX/VISTARIL) 25 MG tablet Take 1 tablet (25 mg total) by mouth every 6 (six) hours. 03/30/16   Rolland Porter, MD  insulin aspart (NOVOLOG FLEXPEN) 100 UNIT/ML FlexPen Inject 2-15 Units into the skin 3 (three) times daily with meals. Per sliding scale - based on carb count and CBG     [provider]  Insulin Glargine (LANTUS SOLOSTAR) 100 UNIT/ML Solostar Pen Inject 10 Units into the skin at bedtime. Patient taking differently: Inject 15 Units into the skin at bedtime.  06/26/16   Richarda Overlie, MD  isosorbide mononitrate (ISMO,MONOKET) 20 MG tablet Take 20 mg by mouth 3 (three) times daily.    [provider]  multivitamin (RENA-VIT) TABS tablet Take 1 tablet by mouth daily.    [provider]  oxyCODONE (ROXICODONE) 5 MG immediate release tablet Take 1 tablet (5 mg total) by mouth 2 (two) times daily as needed for breakthrough pain. 12/24/16   Adonis Huguenin, NP  oxyCODONE-acetaminophen (ROXICET) 5-325 MG tablet Take 1 tablet by mouth every 4 (four) hours as needed for severe pain. 01/21/17   Nadara Mustard, MD  pregabalin (LYRICA) 50 MG capsule Take 1 capsule (50 mg total) by mouth 3 (three) times daily. 07/21/16   Adonis Huguenin, NP  sevelamer carbonate (RENVELA) 800 MG tablet Take 3,200-4,000 mg by mouth 3 (three) times daily with meals.     [provider]  sulfamethoxazole-trimethoprim (BACTRIM DS,SEPTRA DS) 800-160 MG tablet Take 1 tablet by mouth 2 (two) times daily. 02/12/17   Adonis Huguenin, NP  tiZANidine (ZANAFLEX) 4 MG tablet Take 4 mg by mouth 3 (three) times daily as needed for muscle spasms.    [provider]    Physical Exam:  Vitals:   02/14/17 0748 02/14/17 0830 02/14/17 0900 02/14/17 0945  BP: 122/72 114/76 115/68 111/69  Pulse: 95 98 (!) 53 97  Resp: (!) 21 (!) 21 (!) 22 15  Temp:      TempSrc:      SpO2: 100% 100% 96% 100%  Weight:      Height:       Constitutional: NAD, calm, comfortable  Eyes: PERRL, lids and conjunctivae normal ENMT: Mucous membranes are moist, without exudate or lesions  Neck: normal, supple, no masses, no thyromegaly Respiratory: clear to auscultation bilaterally, no wheezing, no crackles. Normal respiratory effort  Cardiovascular: Regular rate and rhythm, no murmurs, rubs or gallops.1+ B LE edema. Left upper extremity AV fistula with thrill.. No carotid bruits.  Abdomen: Soft, non tender, No hepatosplenomegaly. Bowel sounds positive.  Musculoskeletal:Left foot with sutures in place at the stump area, with mild drainage, swelling of the forefoot to the knee is noted, erythema at the stump with central  necrosis. Without erythema Right food is well-healed  Skin: no jaundice,  remarkable for chronic pock marks, L foot erythema as above  Neurologic:  Sensation intact  Strength equal in all extremities Psychiatric:   Alert and oriented x 3.Flat affect     Labs on Admission: I have personally reviewed following labs and imaging studies  CBC:  Recent Labs Lab 02/14/17 0526  WBC 18.9*  NEUTROABS 17.2*  HGB 10.0*  HCT 30.8*  MCV 92.5  PLT 167    Basic Metabolic Panel:  Recent Labs Lab 02/14/17 0526  NA 137  K 4.2  CL 94*  CO2 22  GLUCOSE 79  BUN 94*  CREATININE 5.98*  CALCIUM 7.7*    GFR: Estimated Creatinine Clearance: 16.7 mL/min (A) (by C-G formula based on SCr of 5.98 mg/dL (H)).  Liver Function Tests:  Recent Labs Lab 02/14/17 0526  AST 17  ALT 21  ALKPHOS 213*  BILITOT 3.8*  PROT 6.6  ALBUMIN 2.6*    Recent Labs Lab 02/14/17 0526  LIPASE 15   No results for input(s): AMMONIA in the last 168 hours.  Coagulation Profile: No results for input(s): INR, PROTIME in the last 168 hours.  Cardiac Enzymes: No results for input(s): CKTOTAL, CKMB, CKMBINDEX, TROPONINI in the last 168 hours.  BNP (last 3 results) No results for input(s): PROBNP in the last 8760 hours.  HbA1C: No results for input(s): HGBA1C in the last 72 hours.  CBG: No results for input(s): GLUCAP in the last 168 hours.  Lipid Profile: No results for input(s): CHOL, HDL, LDLCALC, TRIG, CHOLHDL, LDLDIRECT in the last 72 hours.  Thyroid Function Tests: No results for input(s): TSH, T4TOTAL, FREET4, T3FREE, THYROIDAB in the last 72 hours.  Anemia Panel: No results for input(s): VITAMINB12, FOLATE, FERRITIN, TIBC, IRON, RETICCTPCT in the last 72 hours.  Urine analysis:    Component Value Date/Time   COLORURINE YELLOW 09/21/2014 1908   APPEARANCEUR CLOUDY (A) 09/21/2014 1908   LABSPEC 1.012 09/21/2014 1908   PHURINE 6.5 09/21/2014 1908   GLUCOSEU 250 (A) 09/21/2014 1908    HGBUR MODERATE (A) 09/21/2014 1908   BILIRUBINUR NEGATIVE 09/21/2014 1908   KETONESUR NEGATIVE 09/21/2014 1908   PROTEINUR >300 (A) 09/21/2014 1908   UROBILINOGEN 0.2 09/21/2014 1908   NITRITE NEGATIVE 09/21/2014 1908   LEUKOCYTESUR NEGATIVE 09/21/2014 1908    Sepsis Labs: @LABRCNTIP (procalcitonin:4,lacticidven:4) )No results found for this or any previous visit (from the past 240 hour(s)).   Radiological Exams on Admission: Dg Chest 2 View  Result Date: 02/14/2017 CLINICAL DATA:  Hypoxia. Pt came to ED with left foot pain post amputation of toes. Hx diabetes, PNA, COPD, HTN, MI, CAD, CKD, CHF. EXAM: CHEST  2 VIEW COMPARISON:  04/22/2016 FINDINGS: Moderate enlargement of the cardiopericardial silhouette, stable. No mediastinal or hilar masses. No evidence of adenopathy. Clear lungs.  No pleural effusion.  No pneumothorax. Skeletal structures are unremarkable. IMPRESSION: 1. No acute cardiopulmonary disease. 2. Stable moderate cardiomegaly. Electronically Signed   By: Amie Portland M.D.   On: 02/14/2017 09:26   Dg Foot Complete Left  Result Date: 02/14/2017 CLINICAL DATA:  Pt c/o severe pain and inflammation in left foot. 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 Weeping evident at site of amputation. Pt reports nausea today. Hx diabetes, HTN, CKD. EXAM: LEFT FOOT - COMPLETE 3+ VIEW COMPARISON:  12/29/2016 FINDINGS: Postop changes including resection of the proximal first and fourth metatarsals since prior exam. Small proximal fragments of the second third and fifth metatarsals are identified. There is some indistinctness of the resection margin of the third and fifth metatarsal fragments suggesting osteomyelitis. There is a soft tissue defect distal  to the resection margin seen best on the lateral projection. IMPRESSION: 1. Interval postop changes. Indistinctness of residual third and fifth metatarsal fragments, suggesting osteomyelitis. Electronically Signed   By: Corlis Leak M.D.    On: 02/14/2017 08:15    EKG: Independently reviewed.  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 Patient s/p L TM amputation in Jan 2018, with revision 12/2016, now presenting with left foot drainage, pain and erythema despite oral Clindamycin . Does have leukocytosis without fever Initial lactic acid . Left foot XR posittive for osteo. Dr. Cleophas Dunker, Ortho, to consult Afebrile, VSS,  and nontoxic appearing. WBC 18 Lactic acid 1.45  Admit to Inpatient medsurg  Admit to Med Surg  Continue Clindamycin per Pharmacy as recommended by Ortho. Other plans to follow  Follow Blood cultures  Lactic acid  WBC in am  Dilaudid IV and oral agents for pain    ESRD on HD TThS, Cr 5.98  Baseline 3-4.  Nephrology involved, possible HD today  Renal Diet. Other plans as per Nephrology Check CMET in am  Type II Diabetes Current blood sugar level is 79, of note, anion gap 21 Lab Results  Component Value Date   HGBA1C 5.2 10/08/2016  Repeat labs to monitor AG has closed  Lantus half dose at 7 units at bedtime , SSI Heart healthy carb modified diet.    Hypertension BP 111/69   Pulse 97   Controlled Continue home anti-hypertensive medications after dialysis  Hyperlipidemia Continue home statins   Non-ischemic cardiomyopathy / History of combined systolic and diastolic heart failure/ history of NSTEMI - by echo 8/16- EF 35-40%. EKG SR without acute findings . Weight 238 lbs  Continue meds including Coreg after HD   Anemia of chronic disease and in the setting of infection  Hemoglobin on admission 10 at baseline    Repeat CBC in am  No transfusion is indicated at this time Continue Iron supplements  COPD without exacerbation Osats normal  CXR NAD  WBC  18.9 Continue nebs, inhalers and O2 prn    Depression Continue Cymbalta  Restless leg syndrome  Continue Lyrica    DVT prophylaxis: Heparin after HD  Code Status:   Full      Family Communication:  Discussed with patient Disposition Plan: Expect patient to be discharged to home after condition improves Consults called:    Orthopedics and Renal were requested by EDP  Admission status: Medsurg Inpatient      [facility] E, PA-C [facility]   02/14/2017, 10:19 AM

## 2017-02-14 NOTE — Consult Note (Signed)
Norlene Campbell, MD   Jacqualine Code, PA-C [address] [place], [place]  [address]                             [phone]   ORTHOPAEDIC CONSULTATION  [name]            MRN:  [id number] DOB/SEX:  Nov 05, 1980/female    REQUESTING PHYSICIAN:  Dr Willette Pa  CHIEF COMPLAINT:  Painful and draining left transmetatarsal amputation  HISTORY: [name] a 36 y.o. female with a history of end stage renal disease and diabetes on dialysis.  Prior transmet amputation by Dr [name] in January with revision in April 2018, presented to ER with wound dehiscence and drainange. Admitted for IV antibiotics per medical service.    PAST MEDICAL HISTORY: Patient Active Problem List   Diagnosis Date Noted  . Osteomyelitis (HCC) 02/14/2017  . Otogenic vertigo 01/30/2017  . Dehiscence of amputation stump (HCC)   . Status post transmetatarsal amputation of left foot (HCC) 10/21/2016  . Status post transmetatarsal amputation of right foot (HCC) 10/08/2016  . 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 Diabetic osteomyelitis (HCC)   . 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 Diabetic foot infection (HCC) 06/20/2016  . ESRD (end stage renal disease) on dialysis (HCC) 06/20/2016  . Diabetic foot ulcer (HCC) 06/20/2016  . Chronic pain syndrome 02/24/2016  . Pre-operative clearance 10/12/2015  . Normal coronary arteries 10/12/2015  . NSTEMI- type 2, Troponin 11.2 05/11/2015  . Respiratory failure requiring intubation (HCC) 05/10/2015  . History of arthroscopy of right shoulder-05/10/15 05/10/2015  . CKD (chronic kidney disease) stage 5, GFR less than 15 ml/min (HCC) 09/21/2014  . Unspecified asthma(493.90) 10/25/2013  . Acute combined systolic and diastolic heart failure (HCC) 09/21/2013  . Non-ischemic cardiomyopathy - by echo 8/16- EF 35-40% 09/16/2013  . Morbid obesity-  09/16/2013  . Uncontrolled type 2 diabetes with renal manifestation  (HCC) 09/15/2013  . Normocytic anemia 09/15/2013  . Lower extremity edema 09/15/2013  . Hyperlipidemia   . Hypertension    Past Medical History:  Diagnosis Date  . Anemia   . Arthritis    "left hand" (09/15/2013)  . Asthma   . CHF (congestive heart failure) (HCC)   . Chronic bronchitis (HCC)    "just about q yr" (09/15/2013)  . Chronic kidney disease    "low kidney function" (09/15/2013) ,  T/Th/Sa  . COPD (chronic obstructive pulmonary disease) (HCC)   . Coronary artery disease   . Hyperlipidemia   . Hypertension   . Migraine    "get them alot" (09/15/2013)  . Myocardial infarction [facility]) 04/2015   NSTEMI  . Normal coronary arteries    by cardiac catheterization 09/20/13  . Peripheral vascular disease (HCC)   . Pneumonia    "couple times; have it now" (09/15/2013)  . PONV (postoperative nausea and vomiting)   . Restless legs   . Shortness of breath    "just recently; related to the pneumonia" (09/15/2013)  . Type 1 diabetes mellitus (HCC)    type 2   Past Surgical History:  Procedure Laterality Date  . AMPUTATION Right 06/25/2016   Procedure: Amputation Right Great Toe at the Metatarsophalangeal Joint;  Surgeon: Nadara Mustard, MD;  Location: [facility] OR;  Service: Orthopedics;  Laterality: Right;  . AMPUTATION Bilateral 10/08/2016   Procedure: Bilateral Transmetatarsal Amputation;  Surgeon: Nadara Mustard, MD;  Location: [facility] OR;  Service: Orthopedics;  Laterality: Bilateral;  . AV FISTULA PLACEMENT Left 03/27/2015   Procedure: CREATION RADIAL CEPHALIC ARTERIOVENOUS FISTULA;  Surgeon: Chuck Hint, MD;  Location: [facility] OR;  Service: Vascular;  Laterality: Left;  . AV FISTULA PLACEMENT Left 11/23/2015   Procedure:  LEFT ARM BASILIC VEIN TRANSPOSITION  ;  Surgeon: Chuck Hint, MD;  Location: [facility] OR;  Service: Vascular;  Laterality: Left;  . CESAREAN SECTION  1999; 2006  . CORONARY ANGIOGRAM  09/20/2013   Procedure: CORONARY ANGIOGRAM;  Surgeon: Runell Gess, MD;   Location: [facility] CATH LAB;  Service: Cardiovascular;;  . FINGER SURGERY Left 1985   3rd and 4th digits reconstructed after cut off" (09/15/2013)  . PERIPHERAL VASCULAR CATHETERIZATION N/A 09/05/2016   Procedure: Abdominal Aortogram;  Surgeon: Sherren Kerns, MD;  Location: [facility] INVASIVE CV LAB;  Service: Cardiovascular;  Laterality: N/A;  . PERIPHERAL VASCULAR CATHETERIZATION Bilateral 09/05/2016   Procedure: Lower Extremity Angiography;  Surgeon: Sherren Kerns, MD;  Location: [facility] INVASIVE CV LAB;  Service: Cardiovascular;  Laterality: Bilateral;  . PERIPHERAL VASCULAR CATHETERIZATION Right 09/05/2016   Procedure: Peripheral Vascular Balloon Angioplasty;  Surgeon: Sherren Kerns, MD;  Location: [facility] INVASIVE CV LAB;  Service: Cardiovascular;  Laterality: Right;  peroneal and AT  . SHOULDER ARTHROSCOPY WITH BICEPSTENOTOMY Right 05/10/2015   Procedure: RIGHT SHOULDER ARTHROSCOPY WITH BICEPS TENOTOMY, DEBRIDEMENT LABRAL TEAR;  Surgeon: Jones Broom, MD;  Location: [facility] OR;  Service: Orthopedics;  Laterality: Right;  Right shoulder arthroscopy biceps tenotomy, debridement labral tear  . STUMP REVISION Left 01/21/2017   Procedure: . Revision Left Transmetatarsal Amputation;  Surgeon: Nadara Mustard, MD;  Location: [facility] OR;  Service: Orthopedics;  Laterality: Left;  . TONSILLECTOMY  1997  . TUBAL LIGATION  2006    MEDICATIONS:   Current Facility-Administered Medications:  .  albuterol (PROVENTIL HFA;VENTOLIN HFA) 108 (90 Base) MCG/ACT inhaler 2 puff, 2 puff, Inhalation, Q6H PRN, Gwynneth Munson, Sara E, PA-C .  albuterol (PROVENTIL) (2.5 MG/3ML) 0.083% nebulizer solution 2.5 mg, 2.5 mg, Nebulization, Q6H PRN, Wertman, Sara E, PA-C .  atorvastatin (LIPITOR) tablet 40 mg, 40 mg, Oral, QHS, Wertman, Sara E, PA-C .  budesonide (PULMICORT) nebulizer solution 0.25 mg, 0.25 mg, Nebulization, BID PRN, Wertman, Sara E, PA-C .  carvedilol (COREG) tablet 25 mg, 25 mg, Oral, BID, Wertman, Sara E, PA-C .  clindamycin (CLEOCIN)  IVPB 900 mg, 900 mg, Intravenous, Once, Dang, Thuy D, RPH .  clindamycin (CLEOCIN) IVPB 900 mg, 900 mg, Intravenous, Q8H, Dang, Thuy D, RPH .  DULoxetine (CYMBALTA) DR capsule 30 mg, 30 mg, Oral, Daily, Wertman, Sara E, PA-C .  fentaNYL (SUBLIMAZE) 100 MCG/2ML injection, , , ,  .  fentaNYL (SUBLIMAZE) injection 50 mcg, 50 mcg, Nasal, Q20 Min PRN, Mancel Bale, MD, 50 mcg at 02/14/17 0540 .  heparin injection 5,000 Units, 5,000 Units, Subcutaneous, Q8H, Wertman, Sung Amabile, PA-C .  HYDROmorphone (DILAUDID) injection 1 mg, 1 mg, Intravenous, Q4H PRN, Marcos Eke, PA-C .  hydrOXYzine (ATARAX/VISTARIL) tablet 25 mg, 25 mg, Oral, Q6H, Wertman, Sara E, PA-C .  insulin aspart (novoLOG) injection 0-9 Units, 0-9 Units, Subcutaneous, TID WC, Wertman, Sung Amabile, PA-C .  Insulin Glargine (LANTUS) Solostar Pen 7 Units, 7 Units, Subcutaneous, QHS, Wertman, Sara E, PA-C .  isosorbide mononitrate (ISMO,MONOKET) tablet 20 mg, 20 mg, Oral, TID, Wertman, Sara E, PA-C .  multivitamin (RENA-VIT) tablet 1 tablet, 1 tablet, Oral, Daily, Wertman, Sara E, PA-C .  ondansetron (ZOFRAN) tablet 4  mg, 4 mg, Oral, Q6H PRN **OR** ondansetron (ZOFRAN) injection 4 mg, 4 mg, Intravenous, Q6H PRN, Gwynneth Munson, Sara E, PA-C .  oxyCODONE (Oxy IR/ROXICODONE) immediate release tablet 5 mg, 5 mg, Oral, BID PRN, Gwynneth Munson, Sara E, PA-C .  oxyCODONE-acetaminophen (PERCOCET/ROXICET) 5-325 MG per tablet 1 tablet, 1 tablet, Oral, Q4H PRN, Gwynneth Munson, Sara E, PA-C .  pregabalin (LYRICA) capsule 50 mg, 50 mg, Oral, TID, Wertman, Sara E, PA-C .  sevelamer carbonate (RENVELA) tablet 3,200-4,000 mg, 3,200-4,000 mg, Oral, TID WC, Wertman, Sung Amabile, PA-C  ALLERGIES:   Allergies  Allergen Reactions  . Aspirin Anaphylaxis  . Sulfur Hives  . Tramadol Hives and Other (See Comments)    Pt states she feels weird   . Gabapentin Other (See Comments)    Lethargic     REVIEW OF SYSTEMS: REVIEWED IN DETAIL IN CHART  FAMILY HISTORY:   Family History  Problem  Relation Age of Onset  . Diabetes Mother   . Stroke Mother   . Hypertension Father   . Hyperlipidemia Father   . Cancer - Lung Father   . Diabetes Maternal Grandmother   . Cancer Paternal Grandmother     SOCIAL HISTORY:   reports that she has never smoked. She has never used smokeless tobacco. She reports that she does not drink alcohol or use drugs.   EXAMINATION: Vital signs in last 24 hours: Temp:  [97.4 F (36.3 C)-97.7 F (36.5 C)] 97.7 F (36.5 C) (05/19 0650) Pulse Rate:  [53-105] 100 (05/19 1100) Resp:  [15-22] 18 (05/19 1100) BP: (102-128)/(56-79) 102/69 (05/19 1100) SpO2:  [84 %-100 %] 97 % (05/19 1100) Weight:  [238 lb (108 kg)] 238 lb (108 kg) (05/19 0444)  Musculoskeletal Exam  :LLE reveals partial dehiscence of lateral one-half of the wound.  Stitches remain in place with serous drainage. Mild erythema about incision. Altered sensibility related to diabetes.  DIAGNOSTIC STUDIES: Recent laboratory studies:  Recent Labs  02/14/17 0526  WBC 18.9*  HGB 10.0*  HCT 30.8*  PLT 167    Recent Labs  02/14/17 0526  NA 137  K 4.2  CL 94*  CO2 22  BUN 94*  CREATININE 5.98*  GLUCOSE 79  CALCIUM 7.7*   Lab Results  Component Value Date   INR 1.01 09/20/2013     Recent Radiographic Studies :  Dg Chest 2 View  Result Date: 02/14/2017 CLINICAL DATA:  Hypoxia. Pt came to ED with left foot pain post amputation of toes. Hx diabetes, PNA, COPD, HTN, MI, CAD, CKD, CHF. EXAM: CHEST  2 VIEW COMPARISON:  04/22/2016 FINDINGS: Moderate enlargement of the cardiopericardial silhouette, stable. No mediastinal or hilar masses. No evidence of adenopathy. Clear lungs.  No pleural effusion.  No pneumothorax. Skeletal structures are unremarkable. IMPRESSION: 1. No acute cardiopulmonary disease. 2. Stable moderate cardiomegaly. Electronically Signed   By: Amie Portland M.D.   On: 02/14/2017 09:26   Dg Foot Complete Left  Result Date: 02/14/2017 CLINICAL DATA:  Pt c/o severe pain  and inflammation in left foot. 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 Weeping evident at site of amputation. Pt reports nausea today. Hx diabetes, HTN, CKD. EXAM: LEFT FOOT - COMPLETE 3+ VIEW COMPARISON:  12/29/2016 FINDINGS: Postop changes including resection of the proximal first and fourth metatarsals since prior exam. Small proximal fragments of the second third and fifth metatarsals are identified. There is some indistinctness of the resection margin of the third and fifth metatarsal fragments suggesting osteomyelitis. There is a soft tissue defect  distal to the resection margin seen best on the lateral projection. IMPRESSION: 1. Interval postop changes. Indistinctness of residual third and fifth metatarsal fragments, suggesting osteomyelitis. Electronically Signed   By: Corlis Leak M.D.   On: 02/14/2017 08:15    ASSESSMENT: possible osteo left foot with wound dehiscence   PLAN: IV antibiotics per medicine  Probably will need revision vs BKA  Dr [name] to evaluate first of week   Jacqualine Code 02/14/2017, 11:38 AM

## 2017-02-14 NOTE — Progress Notes (Signed)
CODE BLUE NOTE  Patient Name: [name]   MRN: [id number]   Date of Birth/ Sex: [birth year] , female      Admission Date: 02/14/2017  Attending Provider: Lahoma Crocker, MD  Primary Diagnosis: <principal problem not specified>    Indication: Pt was in her usual state of health until this PM, when she was noted to be in asystole. Code blue was subsequently called. At the time of arrival on scene, ACLS protocol was underway.    Technical Description:  - CPR performance duration:  8 minutes  - Was defibrillation or cardioversion used? No   - Was external pacer placed? Yes  - Was patient intubated pre/post CPR? Yes    Medications Administered: Y = Yes; Blank = No Amiodarone    Atropine    Calcium    Epinephrine  2  Lidocaine    Magnesium    Norepinephrine    Phenylephrine    Sodium bicarbonate  2  Vasopressin      Post CPR evaluation:  - Final Status - Was patient successfully resuscitated ? Yes - What is current rhythm? Sinus rythm - What is current hemodynamic status? Stable    Miscellaneous Information:  - Labs sent, including:   - Primary team notified?  Yes  - Family Notified? Yes  - Additional notes/ transfer status: Patient transfer to CCM        Lovena Neighbours, MD  02/14/2017, 5:43 PM

## 2017-02-14 NOTE — ED Provider Notes (Signed)
[facility]-EMERGENCY DEPT Provider Note   CSN: [id number] Arrival date & time: 02/14/17  0440     History   Chief Complaint Chief Complaint  Patient presents with  . Leg Pain  . Diarrhea    HPI [name] is a 36 y.o. female.  Patient with history of end-stage renal disease on dialysis (dialyzes through a left upper extremity fistula on [facility], T/R/S), diabetes, partial amputation L foot 1/18 complicated by dehiscence -- presents with worsening pain and swelling of her left lower extremity with increased drainage and chills. Patient has been seen by her orthopedist several times this week. She was placed on clindamycin. She also had a left lower extremity ultrasound which she states did not show any blood clots. Symptoms are progressing despite this treatment. She could not take the pain last night so she opted to come to the emergency department for treatment. She has been taking Percocet at home without relief. She denies any chest pain, cough, shortness of breath. The onset of this condition was chronic. Aggravating factors: palpation. Alleviating factors: none.        Past Medical History:  Diagnosis Date  . Anemia   . Arthritis    "left hand" (09/15/2013)  . Asthma   . CHF (congestive heart failure) (HCC)   . Chronic bronchitis (HCC)    "just about q yr" (09/15/2013)  . Chronic kidney disease    "low kidney function" (09/15/2013) ,  T/Th/Sa  . COPD (chronic obstructive pulmonary disease) (HCC)   . Coronary artery disease   . Hyperlipidemia   . Hypertension   . Migraine    "get them alot" (09/15/2013)  . Myocardial infarction [facility]) 04/2015   NSTEMI  . Normal coronary arteries    by cardiac catheterization 09/20/13  . Peripheral vascular disease (HCC)   . Pneumonia    "couple times; have it now" (09/15/2013)  . PONV (postoperative nausea and vomiting)   . Restless legs   . Shortness of breath    "just recently; related to the pneumonia" (09/15/2013)  . Type 1  diabetes mellitus (HCC)    type 2    Patient Active Problem List   Diagnosis Date Noted  . Dehiscence of amputation stump (HCC)   . Status post transmetatarsal amputation of left foot (HCC) 10/21/2016  . Status post transmetatarsal amputation of right foot (HCC) 10/08/2016  . 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 Diabetic osteomyelitis (HCC)   . 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 Diabetic foot infection (HCC) 06/20/2016  . ESRD (end stage renal disease) on dialysis (HCC) 06/20/2016  . Diabetic foot ulcer (HCC) 06/20/2016  . Pre-operative clearance 10/12/2015  . Normal coronary arteries 10/12/2015  . NSTEMI- type 2, Troponin 11.2 05/11/2015  . Respiratory failure requiring intubation (HCC) 05/10/2015  . History of arthroscopy of right shoulder-05/10/15 05/10/2015  . CKD (chronic kidney disease) stage 5, GFR less than 15 ml/min (HCC) 09/21/2014  . Unspecified asthma(493.90) 10/25/2013  . Acute combined systolic and diastolic heart failure (HCC) 09/21/2013  . Non-ischemic cardiomyopathy - by echo 8/16- EF 35-40% 09/16/2013  . Morbid obesity-  09/16/2013  . 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) 09/15/2013  . Normocytic anemia 09/15/2013  . Lower extremity edema 09/15/2013  . Hyperlipidemia   . Hypertension     Past Surgical History:  Procedure Laterality Date  . AMPUTATION Right 06/25/2016   Procedure: Amputation Right Great Toe at the Metatarsophalangeal  Joint;  Surgeon: Nadara Mustard, MD;  Location[facility] OR;  Service: Orthopedics;  Laterality: Right;  . AMPUTATION Bilateral 10/08/2016   Procedure: Bilateral Transmetatarsal Amputation;  Surgeon: Nadara Mustard, MD;  Location: [facility] OR;  Service: Orthopedics;  Laterality: Bilateral;  . AV FISTULA PLACEMENT Left 03/27/2015   Procedure: CREATION RADIAL CEPHALIC ARTERIOVENOUS FISTULA;  Surgeon: Chuck Hint, MD;  Location: [facility] OR;  Service:  Vascular;  Laterality: Left;  . AV FISTULA PLACEMENT Left 11/23/2015   Procedure:  LEFT ARM BASILIC VEIN TRANSPOSITION  ;  Surgeon: Chuck Hint, MD;  Location: [facility] OR;  Service: Vascular;  Laterality: Left;  . CESAREAN SECTION  1999; 2006  . CORONARY ANGIOGRAM  09/20/2013   Procedure: CORONARY ANGIOGRAM;  Surgeon: Runell Gess, MD;  Location: [facility] CATH LAB;  Service: Cardiovascular;;  . FINGER SURGERY Left 1985   3rd and 4th digits reconstructed after cut off" (09/15/2013)  . PERIPHERAL VASCULAR CATHETERIZATION N/A 09/05/2016   Procedure: Abdominal Aortogram;  Surgeon: Sherren Kerns, MD;  Location: [facility] INVASIVE CV LAB;  Service: Cardiovascular;  Laterality: N/A;  . PERIPHERAL VASCULAR CATHETERIZATION Bilateral 09/05/2016   Procedure: Lower Extremity Angiography;  Surgeon: Sherren Kerns, MD;  Location: [facility] INVASIVE CV LAB;  Service: Cardiovascular;  Laterality: Bilateral;  . PERIPHERAL VASCULAR CATHETERIZATION Right 09/05/2016   Procedure: Peripheral Vascular Balloon Angioplasty;  Surgeon: Sherren Kerns, MD;  Location: [facility] INVASIVE CV LAB;  Service: Cardiovascular;  Laterality: Right;  peroneal and AT  . SHOULDER ARTHROSCOPY WITH BICEPSTENOTOMY Right 05/10/2015   Procedure: RIGHT SHOULDER ARTHROSCOPY WITH BICEPS TENOTOMY, DEBRIDEMENT LABRAL TEAR;  Surgeon: Jones Broom, MD;  Location: [facility] OR;  Service: Orthopedics;  Laterality: Right;  Right shoulder arthroscopy biceps tenotomy, debridement labral tear  . STUMP REVISION Left 01/21/2017   Procedure: . Revision Left Transmetatarsal Amputation;  Surgeon: Nadara Mustard, MD;  Location: [facility] OR;  Service: Orthopedics;  Laterality: Left;  . TONSILLECTOMY  1997  . TUBAL LIGATION  2006    OB History    No data available       Home Medications    Prior to Admission medications   Medication Sig Start Date End Date Taking? Authorizing Provider  albuterol (PROVENTIL HFA;VENTOLIN HFA) 108 (90 BASE) MCG/ACT inhaler Inhale 2 puffs into the  lungs every 6 (six) hours as needed for wheezing or shortness of breath.     [provider]  albuterol (PROVENTIL) (2.5 MG/3ML) 0.083% nebulizer solution Take 2.5 mg by nebulization every 6 (six) hours as needed for wheezing or shortness of breath.    [provider]  atorvastatin (LIPITOR) 40 MG tablet Take 40 mg by mouth at bedtime.    [provider]  budesonide (PULMICORT) 0.25 MG/2ML nebulizer solution Take 0.25 mg by nebulization 2 (two) times daily as needed (shortness of breath or wheezing).    [provider]  carvedilol (COREG) 25 MG tablet Take 25 mg by mouth 2 (two) times daily.    [provider]  collagenase (SANTYL) ointment Apply 1 application topically daily. 11/21/16   Adonis Huguenin, NP  diphenhydrAMINE (BENADRYL) 25 MG tablet Take 25 mg by mouth every 6 (six) hours as needed for itching.    [provider]  DULoxetine (CYMBALTA) 30 MG capsule Take 1 capsule (30 mg total) by mouth daily. Qam 07/21/16   Nadara Mustard, MD  hydrOXYzine (ATARAX/VISTARIL) 25 MG tablet Take 1 tablet (25 mg total) by mouth every 6 (six) hours. 03/30/16   Fayrene Fearing,  Loraine Leriche, MD  insulin aspart (NOVOLOG FLEXPEN) 100 UNIT/ML FlexPen Inject 2-15 Units into the skin 3 (three) times daily with meals. Per sliding scale - based on carb count and CBG     [provider]  Insulin Glargine (LANTUS SOLOSTAR) 100 UNIT/ML Solostar Pen Inject 10 Units into the skin at bedtime. Patient taking differently: Inject 15 Units into the skin at bedtime.  06/26/16   Richarda Overlie, MD  isosorbide mononitrate (ISMO,MONOKET) 20 MG tablet Take 20 mg by mouth 3 (three) times daily.    [provider]  multivitamin (RENA-VIT) TABS tablet Take 1 tablet by mouth daily.    [provider]  oxyCODONE (ROXICODONE) 5 MG immediate release tablet Take 1 tablet (5 mg total) by mouth 2 (two) times daily as needed for breakthrough pain. 12/24/16   Adonis Huguenin, NP    oxyCODONE-acetaminophen (ROXICET) 5-325 MG tablet Take 1 tablet by mouth every 4 (four) hours as needed for severe pain. 01/21/17   Nadara Mustard, MD  pregabalin (LYRICA) 50 MG capsule Take 1 capsule (50 mg total) by mouth 3 (three) times daily. 07/21/16   Adonis Huguenin, NP  sevelamer carbonate (RENVELA) 800 MG tablet Take 3,200-4,000 mg by mouth 3 (three) times daily with meals.     [provider]  sulfamethoxazole-trimethoprim (BACTRIM DS,SEPTRA DS) 800-160 MG tablet Take 1 tablet by mouth 2 (two) times daily. 02/12/17   Adonis Huguenin, NP  tiZANidine (ZANAFLEX) 4 MG tablet Take 4 mg by mouth 3 (three) times daily as needed for muscle spasms.    [provider]    Family History Family History  Problem Relation Age of Onset  . Diabetes Mother   . Stroke Mother   . Hypertension Father   . Hyperlipidemia Father   . Cancer - Lung Father   . Diabetes Maternal Grandmother   . Cancer Paternal Grandmother     Social History Social History  Substance Use Topics  . Smoking status: Never Smoker  . Smokeless tobacco: Never Used  . Alcohol use No     Allergies   Aspirin; Sulfur; and Tramadol   Review of Systems Review of Systems  Constitutional: Positive for chills. Negative for fever.  HENT: Negative for rhinorrhea and sore throat.   Eyes: Negative for redness.  Respiratory: Negative for cough.   Cardiovascular: Positive for leg swelling. Negative for chest pain.  Gastrointestinal: Negative for abdominal pain, diarrhea, nausea and vomiting.  Genitourinary: Negative for dysuria.  Musculoskeletal: Positive for joint swelling and myalgias.  Skin: Negative for color change and rash.       Positive for abscess.  Neurological: Negative for headaches.  Hematological: Negative for adenopathy.     Physical Exam Updated Vital Signs BP (!) 109/56 (BP Location: Right Arm)   Pulse (!) 58   Temp 97.7 F (36.5 C) (Oral)   Resp 20   Ht 5' 8.5" (1.74 m)   Wt 238 lb  (108 kg)   SpO2 (!) 84%   BMI 35.66 kg/m   Physical Exam  Constitutional: She appears well-developed and well-nourished.  HENT:  Head: Normocephalic and atraumatic.  Mouth/Throat: Oropharynx is clear and moist.  Eyes: Conjunctivae are normal. Right eye exhibits no discharge. Left eye exhibits no discharge.  Neck: Normal range of motion. Neck supple.  Cardiovascular: Normal rate, regular rhythm and normal heart sounds.   Pulmonary/Chest: Effort normal and breath sounds normal.  Abdominal: Soft. There is no tenderness.  Musculoskeletal: She exhibits edema.  Open  wound L foot with sutures in place, mild drainage. There is swelling of the forefoot to the knee.   R foot well-healed.   L UE AV fistula with thrill.   Neurological: She is alert.  Skin: Skin is warm and dry.  Psychiatric: She has a normal mood and affect.  Nursing note and vitals reviewed.    ED Treatments / Results  Labs (all labs ordered are listed, but only abnormal results are displayed) Labs Reviewed  COMPREHENSIVE METABOLIC PANEL - Abnormal; Notable for the following:       Result Value   Chloride 94 (*)    BUN 94 (*)    Creatinine, Ser 5.98 (*)    Calcium 7.7 (*)    Albumin 2.6 (*)    Alkaline Phosphatase 213 (*)    Total Bilirubin 3.8 (*)    GFR calc non Af Amer 8 (*)    GFR calc Af Amer 9 (*)    Anion gap 21 (*)    All other components within normal limits  CBC WITH DIFFERENTIAL/PLATELET - Abnormal; Notable for the following:    WBC 18.9 (*)    RBC 3.33 (*)    Hemoglobin 10.0 (*)    HCT 30.8 (*)    RDW 16.6 (*)    Neutro Abs 17.2 (*)    All other components within normal limits  CULTURE, BLOOD (ROUTINE X 2)  CULTURE, BLOOD (ROUTINE X 2)  LIPASE, BLOOD  HCG, QUANTITATIVE, PREGNANCY  I-STAT CG4 LACTIC ACID, ED    EKG  EKG Interpretation  Date/Time:  Saturday Feb 14 2017 07:46:27 EDT Ventricular Rate:  96 PR Interval:    QRS Duration: 83 QT Interval:  366 QTC Calculation: 463 R  Axis:   76 Text Interpretation:  Sinus rhythm Borderline repolarization abnormality Baseline wander in lead(s) I aVR aVL since last tracing no significant change Confirmed by Mancel Bale [phone]) on 02/14/2017 7:51:19 AM       Radiology Dg Chest 2 View  Result Date: 02/14/2017 CLINICAL DATA:  Hypoxia. Pt came to ED with left foot pain post amputation of toes. Hx diabetes, PNA, COPD, HTN, MI, CAD, CKD, CHF. EXAM: CHEST  2 VIEW COMPARISON:  04/22/2016 FINDINGS: Moderate enlargement of the cardiopericardial silhouette, stable. No mediastinal or hilar masses. No evidence of adenopathy. Clear lungs.  No pleural effusion.  No pneumothorax. Skeletal structures are unremarkable. IMPRESSION: 1. No acute cardiopulmonary disease. 2. Stable moderate cardiomegaly. Electronically Signed   By: Amie Portland M.D.   On: 02/14/2017 09:26   Dg Foot Complete Left  Result Date: 02/14/2017 CLINICAL DATA:  Pt c/o severe pain and inflammation in left foot. 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 Weeping evident at site of amputation. Pt reports nausea today. Hx diabetes, HTN, CKD. EXAM: LEFT FOOT - COMPLETE 3+ VIEW COMPARISON:  12/29/2016 FINDINGS: Postop changes including resection of the proximal first and fourth metatarsals since prior exam. Small proximal fragments of the second third and fifth metatarsals are identified. There is some indistinctness of the resection margin of the third and fifth metatarsal fragments suggesting osteomyelitis. There is a soft tissue defect distal to the resection margin seen best on the lateral projection. IMPRESSION: 1. Interval postop changes. Indistinctness of residual third and fifth metatarsal fragments, suggesting osteomyelitis. Electronically Signed   By: Corlis Leak M.D.   On: 02/14/2017 08:15    Procedures Procedures (including critical care time)  Medications Ordered in ED Medications  fentaNYL (SUBLIMAZE) injection 50 mcg (50 mcg Nasal Given  02/14/17 0540)   fentaNYL (SUBLIMAZE) 100 MCG/2ML injection (not administered)  clindamycin (CLEOCIN) IVPB 600 mg (not administered)  HYDROmorphone (DILAUDID) injection 1 mg (1 mg Intravenous Given 02/14/17 0745)  ondansetron (ZOFRAN) injection 4 mg (4 mg Intravenous Given 02/14/17 0745)  HYDROmorphone (DILAUDID) injection 1 mg (1 mg Intravenous Given 02/14/17 0909)     Initial Impression / Assessment and Plan / ED Course  I have reviewed the triage vital signs and the nursing notes.  Pertinent labs & imaging results that were available during my care of the patient were reviewed by me and considered in my medical decision making (see chart for details).     Patient seen and examined. Work-up initiated.   Vital signs reviewed and are as follows: BP (!) 109/56 (BP Location: Right Arm)   Pulse (!) 58   Temp 97.7 F (36.5 C) (Oral)   Resp 20   Ht 5' 8.5" (1.74 m)   Wt 238 lb (108 kg)   SpO2 (!) 84%   BMI 35.66 kg/m   Pt discussed with and seen by Dr. Effie Shy. Pt to be admitted.   I spoke with Dr. Cleophas Dunker of [facility] ortho who will see inpatient. Will admit to medicine. Discussed abx regimen, requests continuing clindamycin at this time. Pt reports pain controlled.   9:47 AM Spoke with Huntley Dec NP [facility] who will see/admit patient.    10:46 AM Spoke with Dr. Arlean Hopping who will arrange for inpatient dialysis.   Final Clinical Impressions(s) / ED Diagnoses   Final diagnoses:  Left foot infection  Wound dehiscence  ESRD (end stage renal disease) (HCC)   Admit.   New Prescriptions New Prescriptions   No medications on file     Renne Crigler, Cordelia Poche 02/14/17 1048    Mancel Bale, MD 02/14/17 Andres Labrum    Mancel Bale, MD 02/14/17 1850

## 2017-02-14 NOTE — Progress Notes (Signed)
Pharmacy Antibiotic Note  [name] is a 36 y.o. female admitted on 02/14/2017 with chief complaint of l leg pain and admitted with diabetic osteomyelitis  She has history of ESRD on HD every T TH Sat. HD today.  Pharmacy has been consulted for Vancomycin and Zosyn dosing for osteomyelitis.  Plan: Zosyn 3.375 g IV q12h - 4 hour infusion of each dose Vancomycin 2000 mg loading dose x1 (after HD today if HD planned)  then 1000 mg IV after each HD qTTS.  Vancomycin pre HD levels prn.  Height: 5' 8.5" (174 cm) Weight: 238 lb (108 kg) IBW/kg (Calculated) : 65.05  Temp (24hrs), Avg:97.6 F (36.4 C), Min:97.4 F (36.3 C), Max:97.7 F (36.5 C)   Recent Labs Lab 02/14/17 0526 02/14/17 0839 02/14/17 1141 02/14/17 1624  WBC 18.9*  --   --   --   CREATININE 5.98*  --  6.23*  --   LATICACIDVEN  --  1.45 1.1 3.4*    Estimated Creatinine Clearance: 16.1 mL/min (A) (by C-G formula based on SCr of 6.23 mg/dL (H)).    Allergies  Allergen Reactions  . Aspirin Anaphylaxis  . Sulfur Hives  . Tramadol Hives and Other (See Comments)    Pt states she feels weird   . Gabapentin Other (See Comments)    Lethargic     Antimicrobials this admission: Vanc 5/19>> Zosyn 5/19>> Clindamycin 5/19>>   Microbiology results: 5/19 BXU:XYBF 5/19:  MRSA PCR: sent  Thank you for allowing pharmacy to be a part of this patient's care. Noah Delaine, RPh Clinical Pharmacist Pager: [phone] 02/14/2017 5:35 PM

## 2017-02-15 ENCOUNTER — Inpatient Hospital Stay (HOSPITAL_COMMUNITY): Payer: Medicaid Other

## 2017-02-15 DIAGNOSIS — M869 Osteomyelitis, unspecified: Secondary | ICD-10-CM

## 2017-02-15 DIAGNOSIS — I360 Nonrheumatic tricuspid (valve) stenosis: Secondary | ICD-10-CM

## 2017-02-15 DIAGNOSIS — I469 Cardiac arrest, cause unspecified: Secondary | ICD-10-CM

## 2017-02-15 DIAGNOSIS — E1169 Type 2 diabetes mellitus with other specified complication: Principal | ICD-10-CM

## 2017-02-15 LAB — GLUCOSE, CAPILLARY
GLUCOSE-CAPILLARY: 109 mg/dL — AB (ref 65–99)
GLUCOSE-CAPILLARY: 75 mg/dL (ref 65–99)
GLUCOSE-CAPILLARY: 77 mg/dL (ref 65–99)
GLUCOSE-CAPILLARY: 83 mg/dL (ref 65–99)
Glucose-Capillary: 81 mg/dL (ref 65–99)
Glucose-Capillary: 83 mg/dL (ref 65–99)

## 2017-02-15 LAB — BLOOD GAS, ARTERIAL
ACID-BASE EXCESS: 0.4 mmol/L (ref 0.0–2.0)
BICARBONATE: 24.1 mmol/L (ref 20.0–28.0)
DRAWN BY: 23604
FIO2: 80
LHR: 30 {breaths}/min
MECHVT: 520 mL
O2 Saturation: 99.8 %
PEEP/CPAP: 8 cmH2O
Patient temperature: 98.6
pCO2 arterial: 36 mmHg (ref 32.0–48.0)
pH, Arterial: 7.441 (ref 7.350–7.450)
pO2, Arterial: 402 mmHg — ABNORMAL HIGH (ref 83.0–108.0)

## 2017-02-15 LAB — COMPREHENSIVE METABOLIC PANEL
ALBUMIN: 2.1 g/dL — AB (ref 3.5–5.0)
ALT: 69 U/L — ABNORMAL HIGH (ref 14–54)
ANION GAP: 19 — AB (ref 5–15)
AST: 106 U/L — ABNORMAL HIGH (ref 15–41)
Alkaline Phosphatase: 184 U/L — ABNORMAL HIGH (ref 38–126)
BILIRUBIN TOTAL: 5.6 mg/dL — AB (ref 0.3–1.2)
BUN: 60 mg/dL — ABNORMAL HIGH (ref 6–20)
CO2: 23 mmol/L (ref 22–32)
Calcium: 7.2 mg/dL — ABNORMAL LOW (ref 8.9–10.3)
Chloride: 95 mmol/L — ABNORMAL LOW (ref 101–111)
Creatinine, Ser: 4.29 mg/dL — ABNORMAL HIGH (ref 0.44–1.00)
GFR, EST AFRICAN AMERICAN: 14 mL/min — AB (ref >60)
GFR, EST NON AFRICAN AMERICAN: 12 mL/min — AB (ref >60)
GLUCOSE: 107 mg/dL — AB (ref 65–99)
POTASSIUM: 4.4 mmol/L (ref 3.5–5.1)
Sodium: 137 mmol/L (ref 135–145)
TOTAL PROTEIN: 5.3 g/dL — AB (ref 6.5–8.1)

## 2017-02-15 LAB — RAPID URINE DRUG SCREEN, HOSP PERFORMED
AMPHETAMINES: NOT DETECTED
BARBITURATES: NOT DETECTED
BENZODIAZEPINES: NOT DETECTED
Cocaine: NOT DETECTED
Opiates: NOT DETECTED
TETRAHYDROCANNABINOL: NOT DETECTED

## 2017-02-15 LAB — ECHOCARDIOGRAM COMPLETE
HEIGHTINCHES: 68.5 in
WEIGHTICAEL: 4342.18 [oz_av]

## 2017-02-15 LAB — CBC
HEMATOCRIT: 26.3 % — AB (ref 36.0–46.0)
HEMOGLOBIN: 8.4 g/dL — AB (ref 12.0–15.0)
MCH: 29.3 pg (ref 26.0–34.0)
MCHC: 31.9 g/dL (ref 30.0–36.0)
MCV: 91.6 fL (ref 78.0–100.0)
Platelets: 143 10*3/uL — ABNORMAL LOW (ref 150–400)
RBC: 2.87 MIL/uL — AB (ref 3.87–5.11)
RDW: 16.5 % — ABNORMAL HIGH (ref 11.5–15.5)
WBC: 27.6 10*3/uL — AB (ref 4.0–10.5)

## 2017-02-15 LAB — PROTIME-INR
INR: 1.52
Prothrombin Time: 18.4 s — ABNORMAL HIGH (ref 11.4–15.2)

## 2017-02-15 LAB — TROPONIN I
TROPONIN I: 0.22 ng/mL — AB (ref <0.03)
TROPONIN I: 0.16 ng/mL — AB (ref <0.03)
Troponin I: 0.23 ng/mL (ref <0.03)

## 2017-02-15 MED ORDER — CHLORHEXIDINE GLUCONATE CLOTH 2 % EX PADS
6.0000 | MEDICATED_PAD | Freq: Every day | CUTANEOUS | Status: DC
  Administered 2017-02-15 – 2017-02-28 (×14): 6 via TOPICAL

## 2017-02-15 MED FILL — Medication: Qty: 1 | Status: AC

## 2017-02-15 NOTE — Progress Notes (Signed)
Referral made to [facility], no request made by donor services.

## 2017-02-15 NOTE — Progress Notes (Signed)
eLink Physician-Brief Progress Note Patient Name: Ivianna Cornely DOB: December 07, 1980 MRN: [id number]   Date of Service  02/15/2017  HPI/Events of Note  Flexion of both upper extremities with up rolling of eyeballs noted on camera -posturing versus seizure ? Head CT negative   eICU Interventions  Versed 2 mg IV 1 then propofol drip -titrate to absence of movements EEG when able Load with Keppra if persists      Intervention Category Major Interventions: Seizures - evaluation and management  Desirae Mancusi V. 02/15/2017, 12:01 AM

## 2017-02-15 NOTE — Progress Notes (Signed)
[facility] Progress Note   Subjective: unresponsive, on vent  Vitals:   02/15/17 1015 02/15/17 1100 02/15/17 1200 02/15/17 1212  BP: (!) 118/51 (!) 114/47 (!) 115/57 (!) 115/57  Pulse:    79  Resp: (!) 30 15 (!) 9 (!) 30  Temp:  (!) 96.8 F (36 C) 97 F (36.1 C)   TempSrc:  Core (Comment) Core (Comment)   SpO2:    100%  Weight:      Height:        Inpatient medications: . [START ON 02/17/2017] calcitRIOL  0.75 mcg Per Tube Q T,Th,Sa-HD  . chlorhexidine gluconate (MEDLINE KIT)  15 mL Mouth Rinse BID  . Chlorhexidine Gluconate Cloth  6 each Topical Q0600  . [START ON 02/19/2017] darbepoetin (ARANESP) injection - DIALYSIS  100 mcg Intravenous Q Thu-HD  . fentaNYL (SUBLIMAZE) injection  50 mcg Intravenous Once  . heparin  5,000 Units Subcutaneous Q8H  . insulin aspart  0-9 Units Subcutaneous Q4H  . mouth rinse  15 mL Mouth Rinse 10 times per day  . sevelamer carbonate  3,200 mg Per Tube TID WC   . sodium chloride 40 mL/hr at 02/14/17 2103  . sodium chloride 10 mL/hr at 02/14/17 2103  . clindamycin (CLEOCIN) IV Stopped (02/15/17 0451)  . famotidine (PEPCID) IV Stopped (02/14/17 2318)  . fentaNYL infusion INTRAVENOUS 150 mcg/hr (02/15/17 0800)  . norepinephrine (LEVOPHED) Adult infusion 1 mcg/min (02/15/17 1000)  . piperacillin-tazobactam Stopped (02/15/17 1007)  . propofol (DIPRIVAN) infusion 25 mcg/kg/min (02/15/17 0800)  . 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 8  Anemia of esrd - follow 9  Volume - below dry wt, +LE edema, clear CXR, follow  Plan - HD TTS   Beth Moselle MD [facility] pager [phone]   02/15/2017, 12:41 PM    Recent Labs Lab 02/14/17 1141 02/14/17 1806 02/15/17 0530  NA 134* 137 137  K 4.4 5.1 4.4  CL 96* 97* 95*  CO2 19* 17* 23  GLUCOSE 98 80 107*  BUN 97* 101* 60*  CREATININE 6.23* 6.49* 4.29*  CALCIUM 7.3* 7.2* 7.2*    Recent Labs Lab 02/14/17 0526 02/14/17 1141 02/15/17 0530  AST 17 16 106*  ALT 21 17 69*  ALKPHOS 213* 181* 184*  BILITOT 3.8* 4.1* 5.6*  PROT 6.6 5.8* 5.3*  ALBUMIN 2.6* 2.5* 2.1*    Recent Labs Lab 02/14/17 0526 02/15/17 0530  WBC 18.9* 27.6*  NEUTROABS 17.2*  --   HGB 10.0* 8.4*  HCT 30.8* 26.3*  MCV 92.5 91.6  PLT 167 143*   Iron/TIBC/Ferritin/ %Sat    Component Value Date/Time   IRON 52 02/05/2016 1032   TIBC 305 02/05/2016 1032   FERRITIN 392 (H)  02/05/2016 1032   IRONPCTSAT 17 02/05/2016 1032

## 2017-02-15 NOTE — Progress Notes (Signed)
EEG Completed; Results Pending  

## 2017-02-15 NOTE — Procedures (Signed)
Electroencephalogram (EEG) Report  Date of study: 02/15/17  Requesting clinician: Marshell Garfinkel, MD  Reason for study: Evaluate for seizure  Brief clinical history: This is a 36 year old woman who suffered a cardiac arrest with unknown down time prior to being discovered. She is now intubated in the ICU, sedated and hypothermic. She has been observed to have abnormal movements concerning for myoclonus versus seizure. EEG is now requested for further evaluation.  Medications:  Current Facility-Administered Medications:  .  0.9 %  sodium chloride infusion, , Intravenous, Continuous, Roney Jaffe, MD, Last Rate: 40 mL/hr at 02/14/17 2103 .  0.9 %  sodium chloride infusion, , Intravenous, Continuous, Anders Simmonds, MD, Last Rate: 10 mL/hr at 02/14/17 2103 .  albuterol (PROVENTIL) (2.5 MG/3ML) 0.083% nebulizer solution 2.5 mg, 2.5 mg, Inhalation, Q6H PRN, Wertman, Sara E, PA-C .  budesonide (PULMICORT) nebulizer solution 0.25 mg, 0.25 mg, Nebulization, BID PRN, Wertman, Sara E, PA-C .  [START ON 02/17/2017] calcitRIOL (ROCALTROL) capsule 0.75 mcg, 0.75 mcg, Per Tube, Q T,Th,Sa-HD, Erick Colace, NP .  chlorhexidine gluconate (MEDLINE KIT) (PERIDEX) 0.12 % solution 15 mL, 15 mL, Mouth Rinse, BID, Evangeline Gula, Wyatt Haste, MD, 15 mL at 02/15/17 0800 .  Chlorhexidine Gluconate Cloth 2 % PADS 6 each, 6 each, Topical, Q0600, Rigoberto Noel, MD, 6 each at 02/15/17 0800 .  clindamycin (CLEOCIN) IVPB 900 mg, 900 mg, Intravenous, Q8H, Lady Deutscher, MD, Stopped at 02/15/17 1318 .  [START ON 02/19/2017] Darbepoetin Alfa (ARANESP) injection 100 mcg, 100 mcg, Intravenous, Q Thu-HD, Zeyfang, David, PA-C .  famotidine (PEPCID) IVPB 20 mg premix, 20 mg, Intravenous, Daily, Erick Colace, NP, Stopped at 02/14/17 2318 .  fentaNYL (SUBLIMAZE) bolus via infusion 25 mcg, 25 mcg, Intravenous, Q1H PRN, Erick Colace, NP .  fentaNYL (SUBLIMAZE) injection 50 mcg, 50 mcg, Intravenous, Once, Salvadore Dom E,  NP .  fentaNYL 2533mg in NS 2518m(1058mml) infusion-PREMIX, 25-400 mcg/hr, Intravenous, Continuous, BabErick ColaceP, Last Rate: 15 mL/hr at 02/15/17 1249, 150 mcg/hr at 02/15/17 1249 .  heparin injection 5,000 Units, 5,000 Units, Subcutaneous, Q8H, WerRondel JumboA-C, 5,000 Units at 02/15/17 1334 .  insulin aspart (novoLOG) injection 0-9 Units, 0-9 Units, Subcutaneous, Q4H, BabErick ColaceP, 1 Units at 02/14/17 2337 .  MEDLINE mouth rinse, 15 mL, Mouth Rinse, 10 times per day, SheLady DeutscherD, 15 mL at 02/15/17 1302 .  midazolam (VERSED) injection 1-2 mg, 1-2 mg, Intravenous, Q1H PRN, AlvRigoberto NoelD, 2 mg at 02/14/17 2353 .  norepinephrine (LEVOPHED) 16 mg in dextrose 5 % 250 mL (0.064 mg/mL) infusion, 0-50 mcg/min, Intravenous, Titrated, SheEvangeline GulaheWyatt HasteD, Last Rate: 1.9 mL/hr at 02/15/17 1300, 2 mcg/min at 02/15/17 1300 .  piperacillin-tazobactam (ZOSYN) IVPB 3.375 g, 3.375 g, Intravenous, Q12H, SheLady DeutscherD, Stopped at 02/15/17 1007 .  propofol (DIPRIVAN) 1000 MG/100ML infusion, 5-70 mcg/kg/min, Intravenous, Titrated, AlvRigoberto NoelD, Last Rate: 16.2 mL/hr at 02/15/17 1248, 25 mcg/kg/min at 02/15/17 1248 .  sevelamer carbonate (RENVELA) tablet 3,200 mg, 3,200 mg, Per Tube, TID WC, BabErick ColaceP .  [STDerrill Memo 02/17/2017] vancomycin (VANCOCIN) IVPB 1000 mg/200 mL premix, 1,000 mg, Intravenous, Q T,Th,Sa-HD, SheLady DeutscherD  Description: This is a routine EEG performed using standard international 10-20 electrode placement. A total of 18 channels are recorded, including one for the EKG. She is intubated and unresponsive in the intensive care unit. She is sedated with propofol 25 mcg/kg/m and fentanyl 150 mcg/h  during this recording. Temperature was also recorded at 35.9C during the study.   Activating Maneuvers: None  Findings:  The EKG channel demonstrates a regular rhythm with a rate of 70 beats per minute.   This recording demonstrates  diffuse background slowing. This consisted largely of theta activity with average frequency of 5 Hz. This is not reactive to external stimuli. There is no spontaneous reactivity observed. Throughout the recording are brief periods of background suppression, most of which last approximately one second. However, some are a bit longer and last up to 2-3 seconds.   There are no focal asymmetries. No epileptiform discharges are present. No seizures are recorded.   The technologist noted some occasional tremor versus shivering during the recording. This is associated with muscle artifact on the EEG. There is no associated seizure activity.   Impression:  This is an abnormal EEG due to moderate diffuse slowing with periods of suppression and poor reactivity. No seizures.  Clinical correlation: This EEG is consistent with moderate to severe global cerebral dysfunction but is nonspecific as to the etiology. In this case, this would be consistent with significant anoxic brain injury. This study should be interpreted with caution given that the patient is hypothermic and sedated during the recording. If clinically warranted, consider repeating this study was the patient is reached normothermia and is on less sedation.  Melba Coon, MD [facility]

## 2017-02-15 NOTE — Progress Notes (Signed)
PULMONARY / CRITICAL CARE MEDICINE   Name: [name] MRN: [id number] DOB: August 30, 1981    ADMISSION DATE:  02/14/2017 CONSULTATION DATE:  02/14/17  REFERRING MD:  Feliz Beam MD  CHIEF COMPLAINT:  Sepsis, cardiac arrest  HISTORY OF PRESENT ILLNESS:   36 year old female w/ ESRD d/t poorly controlled DM, bilateral transmetatarsal foot amputations, obesity, HTN, medical non-compliance as well as NICM. Admitted 5/19 with complaints of left leg pain, found to have radiological evidence of osteomyelitis with wound nonhealing, dehiscence, purulent drainage from the wound. Admitted for IV antibiotics, hemodialysis and possible surgery.  Today afternoon she was found unresponsive in the room. She had chest compressions, epi bicarb given. She was intubated by anesthesia and had ROSC in approximately 9 minutes. Downtime prior to code is unknown. She got dilaudid in the afternoon for leg pain.  PAST MEDICAL HISTORY :  She  has a past medical history of Anemia; Arthritis; Asthma; CHF (congestive heart failure) (HCC); Chronic bronchitis (HCC); Chronic kidney disease; COPD (chronic obstructive pulmonary disease) (HCC); Coronary artery disease; Hyperlipidemia; Hypertension; Migraine; Myocardial infarction (HCC) (04/2015); Normal coronary arteries; Peripheral vascular disease (HCC); Pneumonia; PONV (postoperative nausea and vomiting); Restless legs; Shortness of breath; and Type 1 diabetes mellitus (HCC).  PAST SURGICAL HISTORY: She  has a past surgical history that includes Cesarean section (1999; 2006); Finger surgery (Left, 1985); Tonsillectomy (1997); Tubal ligation (2006); coronary angiogram (09/20/2013); AV fistula placement (Left, 03/27/2015); Shoulder arthroscopy with bicepstenotomy (Right, 05/10/2015); AV fistula placement (Left, 11/23/2015); Amputation (Right, 06/25/2016); Cardiac catheterization (N/A, 09/05/2016); Cardiac catheterization (Bilateral, 09/05/2016); Cardiac catheterization (Right, 09/05/2016);  Amputation (Bilateral, 10/08/2016); and Stump revision (Left, 01/21/2017).  Allergies  Allergen Reactions  . Aspirin Anaphylaxis  . Sulfur Hives  . Tramadol Hives and Other (See Comments)    Pt states she feels weird   . Gabapentin Other (See Comments)    Lethargic     No current facility-administered medications on file prior to encounter.    Current Outpatient Prescriptions on File Prior to Encounter  Medication Sig  . albuterol (PROVENTIL HFA;VENTOLIN HFA) 108 (90 BASE) MCG/ACT inhaler Inhale 2 puffs into the lungs every 6 (six) hours as needed for wheezing or shortness of breath.   Marland Kitchen albuterol (PROVENTIL) (2.5 MG/3ML) 0.083% nebulizer solution Take 2.5 mg by nebulization every 6 (six) hours as needed for wheezing or shortness of breath.  Marland Kitchen atorvastatin (LIPITOR) 40 MG tablet Take 40 mg by mouth at bedtime.  . budesonide (PULMICORT) 0.25 MG/2ML nebulizer solution Take 0.25 mg by nebulization 2 (two) times daily as needed (shortness of breath or wheezing).  . carvedilol (COREG) 25 MG tablet Take 25 mg by mouth 2 (two) times daily.  . collagenase (SANTYL) ointment Apply 1 application topically daily. (Patient taking differently: Apply 1 application topically daily as needed (itching). )  . diphenhydrAMINE (BENADRYL) 25 MG tablet Take 25 mg by mouth every 6 (six) hours as needed for itching.  . DULoxetine (CYMBALTA) 30 MG capsule Take 1 capsule (30 mg total) by mouth daily. Qam  . hydrOXYzine (ATARAX/VISTARIL) 25 MG tablet Take 1 tablet (25 mg total) by mouth every 6 (six) hours.  . insulin aspart (NOVOLOG FLEXPEN) 100 UNIT/ML FlexPen Inject 2-15 Units into the skin 3 (three) times daily with meals. Per sliding scale - based on carb count and CBG   . Insulin Glargine (LANTUS SOLOSTAR) 100 UNIT/ML Solostar Pen Inject 10 Units into the skin at bedtime. (Patient taking differently: Inject 15 Units into the skin at bedtime. )  .  isosorbide mononitrate (ISMO,MONOKET) 20 MG tablet Take 20 mg by  mouth 3 (three) times daily.  . multivitamin (RENA-VIT) TABS tablet Take 1 tablet by mouth daily.  Marland Kitchen oxyCODONE (ROXICODONE) 5 MG immediate release tablet Take 1 tablet (5 mg total) by mouth 2 (two) times daily as needed for breakthrough pain.  Marland Kitchen oxyCODONE-acetaminophen (ROXICET) 5-325 MG tablet Take 1 tablet by mouth every 4 (four) hours as needed for severe pain.  . pregabalin (LYRICA) 50 MG capsule Take 1 capsule (50 mg total) by mouth 3 (three) times daily.  . sevelamer carbonate (RENVELA) 800 MG tablet Take 3,200-4,000 mg by mouth 3 (three) times daily with meals.   Marland Kitchen tiZANidine (ZANAFLEX) 4 MG tablet Take 4 mg by mouth 3 (three) times daily as needed for muscle spasms.    FAMILY HISTORY:  Her indicated that her mother is alive. She indicated that her father is deceased. She indicated that her sister is alive. She indicated that both of her brothers are alive. She indicated that the status of her maternal grandmother is unknown. She indicated that the status of her paternal grandmother is unknown.    SOCIAL HISTORY: She  reports that she has never smoked. She has never used smokeless tobacco. She reports that she does not drink alcohol or use drugs.  REVIEW OF SYSTEMS:   Unable to obtain as patient is intubated  SUBJECTIVE:  Noted to have posturing versus seizure activity Now on propofol drip and Versed PRN Off Levophed  VITAL SIGNS: BP 117/74   Pulse 74   Temp (!) 96.3 F (35.7 C)   Resp (!) 22   Ht 5' 8.5" (1.74 m)   Wt 271 lb 6.2 oz (123.1 kg) Comment: subtracted pad weight with water  LMP  (LMP Unknown)   SpO2 100%   BMI 40.66 kg/m   HEMODYNAMICS: CVP:  [10 mmHg-18 mmHg] 10 mmHg  VENTILATOR SETTINGS: Vent Mode: PRVC FiO2 (%):  [80 %-100 %] 80 % Set Rate:  [24 bmp-30 bmp] 30 bmp Vt Set:  [520 mL] 520 mL PEEP:  [5 cmH20-8 cmH20] 8 cmH20 Plateau Pressure:  [20 cmH20-28 cmH20] 28 cmH20  INTAKE / OUTPUT: I/O last 3 completed shifts: In: 1821 [I.V.:1121; IV  Piggyback:700] Out: 200 [Urine:200]  PHYSICAL EXAMINATION: Gen:      No acute distress, obese, appears older than stated age HEENT:  EOMI, sclera anicteric Neck:     No masses; no thyromegaly Lungs:    Clear to auscultation bilaterally; normal respiratory effort CV:         Regular rate and rhythm; no murmurs Abd:      + bowel sounds; soft, non-tender; no palpable masses, no distension Ext:    2+ edema; adequate peripheral perfusion, bilateral foot amputations in bandages Skin:      Warm and dry; no rash Neuro: Sedated, unresponsive   LABS:  BMET  Recent Labs Lab 02/14/17 1141 02/14/17 1806 02/15/17 0530  NA 134* 137 137  K 4.4 5.1 4.4  CL 96* 97* 95*  CO2 19* 17* 23  BUN 97* 101* 60*  CREATININE 6.23* 6.49* 4.29*  GLUCOSE 98 80 107*    Electrolytes  Recent Labs Lab 02/14/17 1141 02/14/17 1806 02/15/17 0530  CALCIUM 7.3* 7.2* 7.2*    CBC  Recent Labs Lab 02/14/17 0526 02/15/17 0530  WBC 18.9* 27.6*  HGB 10.0* 8.4*  HCT 30.8* 26.3*  PLT 167 143*    Coag's  Recent Labs Lab 02/14/17 1806 02/15/17 0530  APTT 39*  --  INR 1.46 1.52    Sepsis Markers  Recent Labs Lab 02/14/17 1141 02/14/17 1624 02/14/17 1807  LATICACIDVEN 1.1 3.4* 4.6*    ABG  Recent Labs Lab 02/14/17 1835 02/15/17 0415  PHART 7.280* 7.441  PCO2ART 40.8 36.0  PO2ART 52.0* 402*    Liver Enzymes  Recent Labs Lab 02/14/17 0526 02/14/17 1141 02/15/17 0530  AST 17 16 106*  ALT 21 17 69*  ALKPHOS 213* 181* 184*  BILITOT 3.8* 4.1* 5.6*  ALBUMIN 2.6* 2.5* 2.1*    Cardiac Enzymes  Recent Labs Lab 02/14/17 1806 02/14/17 2327 02/15/17 0530  TROPONINI 0.06* 0.23* 0.22*    Glucose  Recent Labs Lab 02/14/17 1242 02/14/17 1814 02/14/17 1941 02/14/17 2334 02/15/17 0408  GLUCAP 97 79 79 136* 109*   STUDIES:  Lower extremity ultrasound 02/13/17> no evidence of DVT CT head 02/14/17> normal exam Chest x-ray 02/14/17> cardiomegaly, ETT, left IJ in place. I  have reviewed all images personally Echo>  CULTURES: Bcx 5/19 X 2 >   ANTIBIOTICS: Clinidamycin 5/19 > Vanco 5/19 > Zosyn 5/19 >  SIGNIFICANT EVENTS: 5/19 - Admit, cardiac arrest, transfer to ICU  LINES/TUBES: Oett 5/19>> CVL 5/19>>  DISCUSSION: 36 year old with significant comorbidities admitted with left foot pain, osteomyelitis Status post cardiac arrest probably from sepsis. Need to rule out acute coronary syndrome. Suspicion for PE is low. She had lower extremity Dopplers on 5/18 which are negative for DVT.  ASSESSMENT / PLAN:  PULMONARY A: Acute respiratory failure P:   Continue full vent support Follow chest x-ray, ABG Hold on getting CTA as we are unable to get peripheral access If echo shows RV dysfunction then will empirically treat with heparin.  CARDIOVASCULAR A:  Asystolic cardiac arrest P:  Follow troponin, echo Repeat LA  RENAL A:   ESRD on HD P:   HD as per nephrology  GASTROINTESTINAL A:   Stable, no issues P:   Keep NPO for now Pepcid for ulcer prophylaxis  HEMATOLOGIC A:   Leukocytosis from sepsis P:  Follow CBC  INFECTIOUS A:   Left foot osteomyelitis P:   Continue abx with clindamycin, zosyn, vanco Follow cultures, Pct  ENDOCRINE A:   Diabetes mellitus P:   SSI coverage Hold lantus  NEUROLOGIC A:   Acute encephalopathy after cardiac arrest ? myoclonic activity vs seizures.  P:   RASS goal: 0 Propofol for sedation. Use benzos as needed EEG ordered  FAMILY  - Updates: Mom and sister updated at bedside. Prognosis is gaurded - Inter-disciplinary family meet or Palliative Care meeting due by: 02/22/16  The patient is critically ill with multiple organ system failure and requires high complexity decision making for assessment and support, frequent evaluation and titration of therapies, advanced monitoring, review of radiographic studies and interpretation of complex data.   Critical Care Time devoted to patient care  services, exclusive of separately billable procedures, described in this note is 40 minutes.   Chilton Greathouse MD  Pulmonary and Critical Care Pager [phone] If no answer or after 3pm call: [phone] 02/15/2017, 7:37 AM

## 2017-02-16 ENCOUNTER — Ambulatory Visit (INDEPENDENT_AMBULATORY_CARE_PROVIDER_SITE_OTHER): Payer: Medicaid Other | Admitting: Orthopedic Surgery

## 2017-02-16 ENCOUNTER — Inpatient Hospital Stay (HOSPITAL_COMMUNITY): Payer: Medicaid Other

## 2017-02-16 LAB — CBC
HEMATOCRIT: 24.5 % — AB (ref 36.0–46.0)
Hemoglobin: 8.1 g/dL — ABNORMAL LOW (ref 12.0–15.0)
MCH: 30.2 pg (ref 26.0–34.0)
MCHC: 33.1 g/dL (ref 30.0–36.0)
MCV: 91.4 fL (ref 78.0–100.0)
Platelets: 145 10*3/uL — ABNORMAL LOW (ref 150–400)
RBC: 2.68 MIL/uL — ABNORMAL LOW (ref 3.87–5.11)
RDW: 16.7 % — ABNORMAL HIGH (ref 11.5–15.5)
WBC: 30.2 10*3/uL — AB (ref 4.0–10.5)

## 2017-02-16 LAB — BASIC METABOLIC PANEL
Anion gap: 19 — ABNORMAL HIGH (ref 5–15)
BUN: 70 mg/dL — ABNORMAL HIGH (ref 6–20)
CHLORIDE: 94 mmol/L — AB (ref 101–111)
CO2: 22 mmol/L (ref 22–32)
Calcium: 6.8 mg/dL — ABNORMAL LOW (ref 8.9–10.3)
Creatinine, Ser: 4.73 mg/dL — ABNORMAL HIGH (ref 0.44–1.00)
GFR calc non Af Amer: 11 mL/min — ABNORMAL LOW (ref >60)
GFR, EST AFRICAN AMERICAN: 13 mL/min — AB (ref >60)
Glucose, Bld: 65 mg/dL (ref 65–99)
Potassium: 4.5 mmol/L (ref 3.5–5.1)
Sodium: 135 mmol/L (ref 135–145)

## 2017-02-16 LAB — GLUCOSE, CAPILLARY
GLUCOSE-CAPILLARY: 67 mg/dL (ref 65–99)
GLUCOSE-CAPILLARY: 79 mg/dL (ref 65–99)
GLUCOSE-CAPILLARY: 81 mg/dL (ref 65–99)
GLUCOSE-CAPILLARY: 85 mg/dL (ref 65–99)
GLUCOSE-CAPILLARY: 90 mg/dL (ref 65–99)
Glucose-Capillary: 65 mg/dL (ref 65–99)
Glucose-Capillary: 133 mg/dL — ABNORMAL HIGH (ref 65–99)
Glucose-Capillary: 89 mg/dL (ref 65–99)

## 2017-02-16 LAB — BLOOD GAS, ARTERIAL
ACID-BASE DEFICIT: 1 mmol/L (ref 0.0–2.0)
Bicarbonate: 22.3 mmol/L (ref 20.0–28.0)
FIO2: 40
O2 SAT: 98.7 %
PATIENT TEMPERATURE: 98.6
pCO2 arterial: 31.8 mmHg — ABNORMAL LOW (ref 32.0–48.0)
pH, Arterial: 7.461 — ABNORMAL HIGH (ref 7.350–7.450)
pO2, Arterial: 154 mmHg — ABNORMAL HIGH (ref 83.0–108.0)

## 2017-02-16 LAB — MAGNESIUM
MAGNESIUM: 1.9 mg/dL (ref 1.7–2.4)
Magnesium: 1.9 mg/dL (ref 1.7–2.4)
Magnesium: 2.1 mg/dL (ref 1.7–2.4)

## 2017-02-16 LAB — HEPATITIS B SURFACE ANTIGEN: HEP B S AG: NEGATIVE

## 2017-02-16 LAB — PHOSPHORUS
PHOSPHORUS: 6.8 mg/dL — AB (ref 2.5–4.6)
PHOSPHORUS: 7.6 mg/dL — AB (ref 2.5–4.6)
PHOSPHORUS: 8.3 mg/dL — AB (ref 2.5–4.6)

## 2017-02-16 MED ORDER — VITAL HIGH PROTEIN PO LIQD: 1000.0000 mL | ORAL | Status: DC

## 2017-02-16 MED ORDER — FENTANYL CITRATE (PF) 100 MCG/2ML IJ SOLN: 12.5000 ug | INTRAMUSCULAR | Status: DC | PRN

## 2017-02-16 MED ORDER — CALCIUM CARBONATE ANTACID 1250 MG/5ML PO SUSP
2000.0000 mg | Freq: Three times a day (TID) | ORAL | Status: DC
  Administered 2017-02-16 – 2017-03-09 (×52): 2000 mg via ORAL
  Filled 2017-02-16 (×4): qty 20
  Filled 2017-02-16: qty 10
  Filled 2017-02-16 (×2): qty 20
  Filled 2017-02-16: qty 10
  Filled 2017-02-16 (×2): qty 20
  Filled 2017-02-16: qty 10
  Filled 2017-02-16 (×11): qty 20
  Filled 2017-02-16: qty 10
  Filled 2017-02-16 (×6): qty 20
  Filled 2017-02-16: qty 10
  Filled 2017-02-16 (×13): qty 20
  Filled 2017-02-16: qty 10
  Filled 2017-02-16 (×9): qty 20
  Filled 2017-02-16: qty 10
  Filled 2017-02-16 (×5): qty 20
  Filled 2017-02-16: qty 10
  Filled 2017-02-16 (×7): qty 20

## 2017-02-16 MED ORDER — PRO-STAT SUGAR FREE PO LIQD: 30.0000 mL | Freq: Two times a day (BID) | ORAL | Status: DC

## 2017-02-16 MED ORDER — PRO-STAT SUGAR FREE PO LIQD
60.0000 mL | Freq: Four times a day (QID) | ORAL | Status: DC
  Administered 2017-02-16 – 2017-03-26 (×119): 60 mL
  Filled 2017-02-16 (×121): qty 60

## 2017-02-16 MED ORDER — NEPRO/CARBSTEADY PO LIQD
1000.0000 mL | ORAL | Status: DC
  Administered 2017-02-16 – 2017-03-03 (×13): 1000 mL
  Filled 2017-02-16 (×6): qty 1000

## 2017-02-16 MED ORDER — DEXTROSE 50 % IV SOLN
25.0000 mL | Freq: Once | INTRAVENOUS | Status: AC
  Administered 2017-02-16: 25 mL via INTRAVENOUS

## 2017-02-16 MED ORDER — DEXTROSE 50 % IV SOLN
INTRAVENOUS | Status: AC
  Administered 2017-02-16: 25 mL
  Filled 2017-02-16: qty 50

## 2017-02-16 MED ORDER — DARBEPOETIN ALFA 100 MCG/0.5ML IJ SOSY
100.0000 ug | PREFILLED_SYRINGE | INTRAMUSCULAR | Status: DC
  Administered 2017-02-24: 100 ug via INTRAVENOUS
  Filled 2017-02-16 (×2): qty 0.5

## 2017-02-16 NOTE — Telephone Encounter (Signed)
I called and lm on vm for case manager to advise that this pt should not be set for discharge. That Dr. [name] had advised that she was on a vent this morning with minimal brain activity and that she was not discharging home. Advised to please call me if there was anything that I could do.

## 2017-02-16 NOTE — Progress Notes (Signed)
eLink Physician-Brief Progress Note Patient Name: [name] DOB: 03/26/1981 MRN: [id number]   Date of Service  02/16/2017  HPI/Events of Note  Water bath temperature = 32 F to maintain normothermia. Nurse requests Tylenol. AST and ALT both elevated, therefore, can't use Tylenol. Patient is allergic to ASA (anaphylaxis), therefore, can't use Motrin.   eICU Interventions  Continue present management.      Intervention Category Intermediate Interventions: Other:  Lenell Antu 02/16/2017, 11:47 PM

## 2017-02-16 NOTE — Telephone Encounter (Signed)
Patient's assistant case manager came in this afternoon in regards to wanting to know if we could possibly have a nurse sent out for her dressing changes? She recently had a second surgery on her foot recently and she does not know to to take care of these dressing changes. CB # [phone]) [phone]. Thank you if you need to reach [place] the case manager her CB is [phone].

## 2017-02-16 NOTE — Care Management Note (Signed)
Case Management Note Donn Pierini RN, BSN Unit 2W-Case Manager-- 2H coverage [phone]  Patient Details  Name: [name] MRN: [id number] Date of Birth: 1981/04/24  Subjective/Objective:  Pt admitted- 5/19 with complaints of left leg pain, found to have evidence of osteomyelitis with wound nonhealing, dehiscence, purulent drainage from the wound. Admitted for IV antibiotics, hemodialysis and possible surgery. Found unresponsive in the room on 5/19 - cardiac arrest- intubated               Action/Plan: PTA pt lived at home- Hx - ESRD- HD-T/T/S- CM to follow- pt currently on vent-02/16/17  Expected Discharge Date:                  Expected Discharge Plan:     In-House Referral:  Clinical Social Work  Discharge planning Services  CM Consult  Post Acute Care Choice:    Choice offered to:     DME Arranged:    DME Agency:     HH Arranged:    HH Agency:     Status of Service:  In process, will continue to follow  If discussed at Long Length of Stay Meetings, dates discussed:    Discharge Disposition:   Additional Comments:  Darrold Span, RN 02/16/2017, 12:23 PM

## 2017-02-16 NOTE — Progress Notes (Signed)
Patient ID: [name], female   DOB: [birth year], 36 y.o.   MRN: [id number] Events noted. No intervention planned for the dehiscence of the transmetatarsal amputation.

## 2017-02-16 NOTE — Consult Note (Addendum)
WOC Nurse wound consult note Reason for Consult: Consult requested for left stump.  Pt has a recent surgical site with sutures in place.  Ortho has assessed site and does not plan for further interventions at this time, according to the EMR; requested to provide topical treatment orders. Site is approx 2X10cm. Wound type: Left heel with unstageable pressure injury; 4X3cm, 100% dry eschar without odor, drainage, or fluctuance.  Left anterior heel with scattered areas of deep tissue injury; affected area approx 2X2cm dark purple-red Pressure Injury POA: Yes Dressing procedure/placement/frequency: Xeroform to suture line.  Float heel to reduce pressure.  It is best practice to leave dry stable eschar in place.  No family members present to discuss plan of care. X-ray indicates: Interval postop changes. Indistinctness of residual third and fifth metatarsal fragments, suggesting osteomyelitis. This complex medical condition is beyond WOC scope of practice; Please refer to ortho service for further questions regarding post-op site. Please re-consult if further assistance is needed.  Thank-you,  Cammie Mcgee MSN, RN, CWOCN, [place], CNS [phone]

## 2017-02-16 NOTE — Progress Notes (Signed)
[facility] Progress Note   Subjective:  Remains unresponsive Family in the room EEG done diffuse slowing Off sedation since 8AM Still on small dose norepi  Vitals:   02/16/17 0745 02/16/17 0800 02/16/17 0900 02/16/17 0930  BP:  (!) 102/54 (!) 93/50 (!) 94/53  Pulse: 80 80 75 79  Resp: (!) 30 (!) 30 (!) 30 (!) 30  Temp:  98.6 F (37 C)    TempSrc:  Core (Comment)    SpO2: 100% 100% 100% 100%  Weight:      Height:        Inpatient medications: . [START ON 02/17/2017] calcitRIOL  0.75 mcg Per Tube Q T,Th,Sa-HD  . chlorhexidine gluconate (MEDLINE KIT)  15 mL Mouth Rinse BID  . Chlorhexidine Gluconate Cloth  6 each Topical Q0600  . [START ON 02/19/2017] darbepoetin (ARANESP) injection - DIALYSIS  100 mcg Intravenous Q Thu-HD  . feeding supplement (PRO-STAT SUGAR FREE 64)  30 mL Per Tube BID  . feeding supplement (VITAL HIGH PROTEIN)  1,000 mL Per Tube Q24H  . heparin  5,000 Units Subcutaneous Q8H  . insulin aspart  0-9 Units Subcutaneous Q4H  . mouth rinse  15 mL Mouth Rinse 10 times per day  . sevelamer carbonate  3,200 mg Per Tube TID WC   . sodium chloride 40 mL/hr at 02/14/17 2103  . sodium chloride 10 mL/hr at 02/14/17 2103  . famotidine (PEPCID) IV Stopped (02/15/17 2153)  . norepinephrine (LEVOPHED) Adult infusion 3 mcg/min (02/16/17 0900)  . piperacillin-tazobactam Stopped (02/16/17 1012)  . [START ON 02/17/2017] vancomycin     albuterol, budesonide, fentaNYL (SUBLIMAZE) injection, midazolam  Exam: On vent, not responsive x maybe flickers eyelids with sternal rub Sedation off L IJ CVL Orally intubated/OG tube also No jvd Lungs clear anteriorly fairly clear Regular rhythm  Abd obese soft  L foot bandaged, R foot metatarsal amp Chronic woody changes both LE's with pitting thighs and calves LUA AVF +bruit  CXR - no active disease 5/19 am CXR 5/21 Stable CM ECHO 5/20 30-35%, Grade 2 DD   Recent Labs Lab 02/14/17 1806 02/15/17 0530  02/16/17 0337  NA 137 137 135  K 5.1 4.4 4.5  CL 97* 95* 94*  CO2 17* 23 22  GLUCOSE 80 107* 65  BUN 101* 60* 70*  CREATININE 6.49* 4.29* 4.73*  CALCIUM 7.2* 7.2* 6.8*  PHOS  --   --  6.8*    Recent Labs Lab 02/14/17 0526 02/14/17 1141 02/15/17 0530  AST 17 16 106*  ALT 21 17 69*  ALKPHOS 213* 181* 184*  BILITOT 3.8* 4.1* 5.6*  PROT 6.6 5.8* 5.3*  ALBUMIN 2.6* 2.5* 2.1*    Recent Labs Lab 02/14/17 0526 02/15/17 0530 02/16/17 0337  WBC 18.9* 27.6* 30.2*  NEUTROABS 17.2*  --   --   HGB 10.0* 8.4* 8.1*  HCT 30.8* 26.3* 24.5*  MCV 92.5 91.6 91.4  PLT 167 143* 145*   Iron/TIBC/Ferritin/ %Sat    Component Value Date/Time   IRON 52 02/05/2016 1032   TIBC 305 02/05/2016 1032   FERRITIN 392 (H) 02/05/2016 1032   IRONPCTSAT 17 02/05/2016 1032   Dialysis: TTS [facility] 4h 19mn   114kg   2/2 bath LUA AVF - venofer 50/wk - mircera 75 every 2 wks, last 5/10 - calcitriol 0.75 ug tiw  Summary: 36year old female w/ ESRD d/t poorly controlled DM, HD bilateral TMA's, obesity, HTN, medical non-compliance, NICM. Admitted 5/19 w/left leg pain, found to have radiological  evidence of osteomyelitis with wound nonhealing, dehiscence, purulent drainage from the wound. Admitted for IV antibiotics, hemodialysis and possible surgery. Found unresponsive in the room on 5/19. Got chest compressions, epi bicarb; intubate; cooled. WE are providing HD.    Assessment: 1. L foot osteo - s/p revision L TMA 4/25, now dehisced. on IV Abx. WBC up to 30K 2. Acute resp failure; on vent. Sedation off now.  3. Cardiac arrest (5/19, asystole). Small decline EF (now 30-35%) w/Gr 2 DD. Not sure etiology. S/p cooling protocol 4. ESRD TTS HD. Next HD 5/22 5. NICM EF 35-40% 6. Shock liver - persistent elevation bili, alk phos 7. DM2 8. COPD 9. CKD-MBD - change Renvela to CaCO3 susp down tube and hold off on change in rocaltrol to IV for now 10. Anemia - Last ESA was Mircera 75 mcg 5/10. Start Darbe  5/22. 11. Volume - EDW 114. Weights varying from 108->125. CXR no edema. Still on small dose norepi. Mod woody and pitting edema LE's. Try for 1-2 liters.  Jamal Maes, MD [facility] [phone] Pager 02/16/2017, 11:07 AM

## 2017-02-16 NOTE — Progress Notes (Signed)
Initial Nutrition Assessment  DOCUMENTATION CODES:   Morbid obesity  INTERVENTION:    Initiate Nepro at goal rate of 20 ml/h (480 ml per day) and Prostat 60 ml QID   TF regimen to provide 1664 kcals, 159 gm protein, 349 ml free water daily  NUTRITION DIAGNOSIS:   Inadequate oral intake related to inability to eat as evidenced by NPO status  GOAL:   Provide needs based on ASPEN/SCCM guidelines  MONITOR:   Vent status, TF tolerance, Labs, Weight trends, Skin, I & O's  REASON FOR ASSESSMENT:   Consult Enteral/tube feeding initiation and management  ASSESSMENT:   36 yo Female w/ ESRD d/t poorly controlled DM, bilateral transmetatarsal foot amputations, obesity, HTN, medical non-compliance as well as NICM. Admitted with complaints of left leg pain, found to have radiological evidence of osteomyelitis with wound nonhealing, dehiscence, purulent drainage from the wound. Admitted for IV antibiotics, hemodialysis and possible surgery.  Patient is currently intubated on ventilator support Temp (24hrs), Avg:98 F (36.7 C), Min:96.6 F (35.9 C), Max:98.8 F (37.1 C)  OGT in place  Pt initially presented for painful L foot, being management for infection as an outpatient. Unfortunately suffered a cardiac arrest with unknown down time on 5N-Orthopedics.  Transferred to ICU. EEG showed moderate to severe global cerebral dysfunction.  Consistent with anoxic brain injury. Medications reviewed.  Labs reviewed.  Phosphorus 7.6 (H). CBG's 133-67-90.  Nutrition focused physical exam completed.  No muscle or subcutaneous fat depletion noticed.  Diet Order:  Diet NPO time specified  Skin:     Left heel with unstageable pressure injury Left anterior heel with scattered areas of deep tissue injury  Last BM:  5/21  Height:   Ht Readings from Last 1 Encounters:  02/14/17 5' 8.5" (1.74 m)   Weight:   Wt Readings from Last 1 Encounters:  02/16/17 275 lb 9.2 oz (125 kg)   Ideal  Body Weight:  63.6 kg  BMI:  Body mass index is 41.29 kg/m.  Estimated Nutritional Needs:   Kcal:  1375-1750  Protein:  >/= 159 gm  Fluid:  per MD  EDUCATION NEEDS:   No education needs identified at this time  Maureen Chatters, RD, LDN Pager #: [phone] After-Hours Pager #: [phone]

## 2017-02-16 NOTE — Progress Notes (Signed)
PULMONARY / CRITICAL CARE MEDICINE   Name: [name] MRN: [id number] DOB: 12-21-1980    ADMISSION DATE:  02/14/2017 CONSULTATION DATE:  02/14/17  REFERRING MD:  Feliz Beam MD  CHIEF COMPLAINT:  Sepsis, cardiac arrest  HISTORY OF PRESENT ILLNESS:   36 year old female w/ ESRD d/t poorly controlled DM, bilateral transmetatarsal foot amputations, obesity, HTN, medical non-compliance as well as NICM. Admitted 5/19 with complaints of left leg pain, found to have radiological evidence of osteomyelitis with wound nonhealing, dehiscence, purulent drainage from the wound. Admitted for IV antibiotics, hemodialysis and possible surgery.  Found unresponsive in the room on 5/19. She had chest compressions, epi bicarb given. She was intubated by anesthesia and had ROSC in approximately 9 minutes. Downtime prior to code is unknown. She got dilaudid in the afternoon for leg pain.  SUBJECTIVE:  Off sedation Will grimace when she is suctioned.  VITAL SIGNS: BP (!) 94/53   Pulse 79   Temp 98.6 F (37 C) (Core (Comment))   Resp (!) 30   Ht 5' 8.5" (1.74 m)   Wt 275 lb 9.2 oz (125 kg) Comment: subtracted pads with water  LMP  (LMP Unknown)   SpO2 100%   BMI 41.29 kg/m   HEMODYNAMICS: CVP:  [10 mmHg-14 mmHg] 14 mmHg  VENTILATOR SETTINGS: Vent Mode: PRVC FiO2 (%):  [40 %] 40 % Set Rate:  [30 bmp] 30 bmp Vt Set:  [520 mL] 520 mL PEEP:  [8 cmH20] 8 cmH20 Plateau Pressure:  [24 cmH20-29 cmH20] 26 cmH20  INTAKE / OUTPUT: I/O last 3 completed shifts: In: 3787.2 [I.V.:2857.2; NG/GT:30; IV Piggyback:900] Out: 800 [Urine:200; Emesis/NG output:200; Other:400]  PHYSICAL EXAMINATION: General appearance:  36 Year old  Currently minimally responsive on full vent support.  Eyes: anicteric sclerae, moist conjunctivae; PERRL, EOMI bilaterally. Mouth:  membranes and no mucosal ulcerations; normal hard and soft palate, orally intubated  Neck: Trachea midline; neck supple, no JVD, left IJ CVL in good  position  Lungs/chest: scattered rhonchi, with normal respiratory effort and no intercostal retractions CV: RRR, no MRGs  Abdomen: Soft, non-tender; no masses or HSM Extremities: No peripheral edema or extremity lymphadenopathy Skin: Normal temperature, turgor and texture; no rash, ulcers or subcutaneous nodules Neuro/Psych: grimaces to pain. Does not localize   LABS:  BMET  Recent Labs Lab 02/14/17 1806 02/15/17 0530 02/16/17 0337  NA 137 137 135  K 5.1 4.4 4.5  CL 97* 95* 94*  CO2 17* 23 22  BUN 101* 60* 70*  CREATININE 6.49* 4.29* 4.73*  GLUCOSE 80 107* 65    Electrolytes  Recent Labs Lab 02/14/17 1806 02/15/17 0530 02/16/17 0337  CALCIUM 7.2* 7.2* 6.8*  MG  --   --  1.9  PHOS  --   --  6.8*    CBC  Recent Labs Lab 02/14/17 0526 02/15/17 0530 02/16/17 0337  WBC 18.9* 27.6* 30.2*  HGB 10.0* 8.4* 8.1*  HCT 30.8* 26.3* 24.5*  PLT 167 143* 145*    Coag's  Recent Labs Lab 02/14/17 1806 02/15/17 0530  APTT 39*  --   INR 1.46 1.52    Sepsis Markers  Recent Labs Lab 02/14/17 1141 02/14/17 1624 02/14/17 1807  LATICACIDVEN 1.1 3.4* 4.6*    ABG  Recent Labs Lab 02/14/17 1835 02/15/17 0415 02/16/17 0355  PHART 7.280* 7.441 7.461*  PCO2ART 40.8 36.0 31.8*  PO2ART 52.0* 402* 154*    Liver Enzymes  Recent Labs Lab 02/14/17 0526 02/14/17 1141 02/15/17 0530  AST 17 16  106*  ALT 21 17 69*  ALKPHOS 213* 181* 184*  BILITOT 3.8* 4.1* 5.6*  ALBUMIN 2.6* 2.5* 2.1*    Cardiac Enzymes  Recent Labs Lab 02/14/17 2327 02/15/17 0530 02/15/17 1259  TROPONINI 0.23* 0.22* 0.16*    Glucose  Recent Labs Lab 02/15/17 1955 02/15/17 2335 02/16/17 0338 02/16/17 0439 02/16/17 0807 02/16/17 0842  GLUCAP 75 77 65 133* 67 90   STUDIES:  Lower extremity ultrasound 02/13/17> no evidence of DVT CT head 02/14/17> normal exam Chest x-ray 02/14/17> cardiomegaly, ETT, left IJ in place. I have reviewed all images personally Echo> EF 30-35%,  increased LVF. Diffuse hypokinesis, akinesis of basilar mid-inferior myocardium, grade 2 diastolic dysfxn  EEG 5/20: finding c/w mod to severe global cerebral dysfxn. C/w anoxic injury given clinical course  LE [place] 5/21>>>  CULTURES: Bcx 5/19 X 2 >   ANTIBIOTICS: Clinidamycin 5/19 >5/21 Vanco 5/19 > Zosyn 5/19 >  SIGNIFICANT EVENTS: 5/19 - Admit, cardiac arrest, transfer to ICU  LINES/TUBES: Oett 5/19>> CVL 5/19>>  DISCUSSION:  ASSESSMENT / PLAN:  PULMONARY A: Acute respiratory failure Iatrogenic respiratory alkalosis  -CXR personally reviewed. Rotated film. ETT and CVL good. Aeration on right w increased haziness which could reflect asymmetric edema vs possibly aspiration event (doubt) -main barrier to extubation is mental status P:   Decrease Ve (rate from 30 down to 24) Cont full vent support Volume removal w/ HD on 5/22 Repeat CXR 5/22 RAS goal 0 CARDIOVASCULAR A:  Asystolic cardiac arrest Acute systolic CM: EF has declined from 35-40%-->30-35%.  Grade II DDysfxn Troponin elevation -etiology unclear of arrest.  P:  Cont tele KVO IVF Levophed for MAP > 65 Holding home: coreg, lipitor and isosorbide Get LE [place] ro DVT  RENAL A:   ESRD on HD Fluid and electrolyte disturbance  P:   HD planned for 5/22 Trend chemistry  Replace lytes as needed   GASTROINTESTINAL A:   Shock liver  Protein calorie malnutrition  P:   Trend LFTs Start tubefeeds  HEMATOLOGIC A:   Leukocytosis from sepsis Anemia of critical illness Mild thrombocytopenia  P:  Trend cbc Transfuse per protocol  Cont  heparin    INFECTIOUS A:   Left foot osteomyelitis -wbc remains elevated. ? Etiology  P:   Cont vanc/zosyn day # 3; dc clinda  ENDOCRINE A:   Diabetes mellitus P: ssi      NEUROLOGIC A:   Acute encephalopathy after cardiac arrest-->favor anoxia but metabolic derangements still on ddx  ? myoclonic activity vs seizures.  -EEG c/w diffuse slowing and  possibly anoxic injury P:   Supportive care  Dc sedation Plan for repeat EEG and CT head vs MRI on 5/23 to better prognosticate.   FAMILY  - Updates: Mom and sister updated at bedside. Prognosis is gaurded - Inter-disciplinary family meet or Palliative Care meeting due by: 02/21/37  Discussion 36 yof w/ ESRD. S/p cardiac arrest 5/19 in setting of septic limb/osteo. Time to ROSC estimated at 9 minutes. Down time leading up to this could be up to 2 hours. She has completed hypothermia protocol. Remains in COMA. Will continue supportive care including: Treat osteo Start tube feeds Cont mechanical ventilation Trend and treat electrolyte imbalance  Plan for HD 5/22 -on 5/23 if no clinical improvement in mental status will need to repeat EEG and get either repeat CT head OR MRI.  I updated family at length.   My ccm time 40 minutes.,   Simonne Martinet ACNP-BC [facility] Pulmonary/Critical Care Pager #  [phone] OR # [phone] if no answer

## 2017-02-17 ENCOUNTER — Inpatient Hospital Stay (HOSPITAL_COMMUNITY): Payer: Medicaid Other

## 2017-02-17 ENCOUNTER — Telehealth (INDEPENDENT_AMBULATORY_CARE_PROVIDER_SITE_OTHER): Payer: Self-pay | Admitting: Orthopedic Surgery

## 2017-02-17 DIAGNOSIS — L089 Local infection of the skin and subcutaneous tissue, unspecified: Secondary | ICD-10-CM

## 2017-02-17 DIAGNOSIS — R4020 Unspecified coma: Secondary | ICD-10-CM

## 2017-02-17 DIAGNOSIS — T8130XA Disruption of wound, unspecified, initial encounter: Secondary | ICD-10-CM

## 2017-02-17 DIAGNOSIS — R609 Edema, unspecified: Secondary | ICD-10-CM

## 2017-02-17 LAB — GLUCOSE, CAPILLARY
GLUCOSE-CAPILLARY: 146 mg/dL — AB (ref 65–99)
Glucose-Capillary: 101 mg/dL — ABNORMAL HIGH (ref 65–99)
Glucose-Capillary: 103 mg/dL — ABNORMAL HIGH (ref 65–99)
Glucose-Capillary: 106 mg/dL — ABNORMAL HIGH (ref 65–99)
Glucose-Capillary: 108 mg/dL — ABNORMAL HIGH (ref 65–99)
Glucose-Capillary: 129 mg/dL — ABNORMAL HIGH (ref 65–99)

## 2017-02-17 LAB — COMPREHENSIVE METABOLIC PANEL
ALT: 42 U/L (ref 14–54)
AST: 32 U/L (ref 15–41)
Albumin: 1.8 g/dL — ABNORMAL LOW (ref 3.5–5.0)
Alkaline Phosphatase: 172 U/L — ABNORMAL HIGH (ref 38–126)
Anion gap: 16 — ABNORMAL HIGH (ref 5–15)
BILIRUBIN TOTAL: 11.6 mg/dL — AB (ref 0.3–1.2)
BUN: 81 mg/dL — ABNORMAL HIGH (ref 6–20)
CALCIUM: 7 mg/dL — AB (ref 8.9–10.3)
CHLORIDE: 95 mmol/L — AB (ref 101–111)
CO2: 23 mmol/L (ref 22–32)
CREATININE: 5.37 mg/dL — AB (ref 0.44–1.00)
GFR calc non Af Amer: 9 mL/min — ABNORMAL LOW (ref >60)
GFR, EST AFRICAN AMERICAN: 11 mL/min — AB (ref >60)
GLUCOSE: 108 mg/dL — AB (ref 65–99)
Potassium: 5.8 mmol/L — ABNORMAL HIGH (ref 3.5–5.1)
Sodium: 134 mmol/L — ABNORMAL LOW (ref 135–145)
Total Protein: 5.3 g/dL — ABNORMAL LOW (ref 6.5–8.1)

## 2017-02-17 LAB — BILIRUBIN, FRACTIONATED(TOT/DIR/INDIR)
BILIRUBIN INDIRECT: 4.7 mg/dL — AB (ref 0.3–0.9)
Bilirubin, Direct: 7.3 mg/dL — ABNORMAL HIGH (ref 0.1–0.5)
Total Bilirubin: 12 mg/dL — ABNORMAL HIGH (ref 0.3–1.2)

## 2017-02-17 LAB — BLOOD GAS, ARTERIAL
ACID-BASE DEFICIT: 1.7 mmol/L (ref 0.0–2.0)
Bicarbonate: 23 mmol/L (ref 20.0–28.0)
Drawn by: 29017
FIO2: 40
LHR: 18 {breaths}/min
O2 SAT: 98.7 %
PEEP/CPAP: 5 cmH2O
Patient temperature: 98.6
VT: 520 mL
pCO2 arterial: 42.6 mmHg (ref 32.0–48.0)
pH, Arterial: 7.352 (ref 7.350–7.450)
pO2, Arterial: 148 mmHg — ABNORMAL HIGH (ref 83.0–108.0)

## 2017-02-17 LAB — PHOSPHORUS
Phosphorus: 9.5 mg/dL — ABNORMAL HIGH (ref 2.5–4.6)
Phosphorus: 5 mg/dL — ABNORMAL HIGH (ref 2.5–4.6)

## 2017-02-17 LAB — CBC
HCT: 23.2 % — ABNORMAL LOW (ref 36.0–46.0)
HEMOGLOBIN: 7.5 g/dL — AB (ref 12.0–15.0)
MCH: 29.9 pg (ref 26.0–34.0)
MCHC: 32.3 g/dL (ref 30.0–36.0)
MCV: 92.4 fL (ref 78.0–100.0)
PLATELETS: 146 10*3/uL — AB (ref 150–400)
RBC: 2.51 MIL/uL — AB (ref 3.87–5.11)
RDW: 16.8 % — ABNORMAL HIGH (ref 11.5–15.5)
WBC: 36.7 10*3/uL — ABNORMAL HIGH (ref 4.0–10.5)

## 2017-02-17 LAB — MAGNESIUM
MAGNESIUM: 2.1 mg/dL (ref 1.7–2.4)
Magnesium: 2 mg/dL (ref 1.7–2.4)

## 2017-02-17 MED ORDER — SODIUM POLYSTYRENE SULFONATE 15 GM/60ML PO SUSP
15.0000 g | Freq: Once | ORAL | Status: AC
  Administered 2017-02-17: 15 g
  Filled 2017-02-17: qty 60

## 2017-02-17 MED ORDER — DARBEPOETIN ALFA 100 MCG/0.5ML IJ SOSY
PREFILLED_SYRINGE | INTRAMUSCULAR | Status: AC
  Administered 2017-02-17: 100 ug
  Filled 2017-02-17: qty 0.5

## 2017-02-17 MED ORDER — SODIUM CHLORIDE 0.9 % IV SOLN
500.0000 mg | Freq: Every day | INTRAVENOUS | Status: DC
  Administered 2017-02-17 – 2017-02-24 (×8): 500 mg via INTRAVENOUS
  Filled 2017-02-17 (×8): qty 0.5

## 2017-02-17 NOTE — Telephone Encounter (Signed)
Clinton Quant (Patient Advocate) returned call to Autumn advised she has a question about the message that was left for her this morning concerning patient. The number to contact Charlotte Sanes is [phone]

## 2017-02-17 NOTE — Progress Notes (Signed)
PULMONARY / CRITICAL CARE MEDICINE   Name: Kamarea Feland MRN: [id number] DOB: Sep 27, 1981    ADMISSION DATE:  02/14/2017 CONSULTATION DATE:  02/14/17  REFERRING MD:  Feliz Beam MD  CHIEF COMPLAINT:  Sepsis, cardiac arrest  HISTORY OF PRESENT ILLNESS:   36 year old female w/ ESRD d/t poorly controlled DM, bilateral transmetatarsal foot amputations, obesity, HTN, medical non-compliance as well as NICM. Admitted 5/19 with complaints of left leg pain, found to have radiological evidence of osteomyelitis with wound nonhealing, dehiscence, purulent drainage from the wound. Admitted for IV antibiotics, hemodialysis and possible surgery.  Found unresponsive in the room on 5/19. She had chest compressions, epi bicarb given. She was intubated by anesthesia and had ROSC in approximately 9 minutes. Downtime prior to code is unknown. She got dilaudid in the afternoon for leg pain.  SUBJECTIVE:  Off sedation. No follows commands VITAL SIGNS: BP (!) 106/59   Pulse 99   Temp 98.8 F (37.1 C) (Core (Comment))   Resp 19   Ht 5' 8.5" (1.74 m)   Wt 278 lb (126.1 kg) Comment: subtracted pads with water  LMP  (LMP Unknown)   SpO2 99%   BMI 41.65 kg/m   HEMODYNAMICS: CVP:  [10 mmHg-15 mmHg] 14 mmHg  VENTILATOR SETTINGS: Vent Mode: CPAP;PSV FiO2 (%):  [40 %] 40 % Set Rate:  [18 bmp] 18 bmp Vt Set:  [520 mL] 520 mL PEEP:  [5 cmH20] 5 cmH20 Pressure Support:  [5 cmH20] 5 cmH20 Plateau Pressure:  [16 cmH20-22 cmH20] 21 cmH20  INTAKE / OUTPUT: I/O last 3 completed shifts: In: 2511.3 [I.V.:1631.3; NG/GT:480; IV Piggyback:400] Out: 200 [Emesis/NG output:200]  PHYSICAL EXAMINATION: General:  MO female on vent HEENT: MM pink/moist, OTT -> vent PSY: Non interactive Neuro: +doll's eyes, wd's to noxious stimulus  CV: s1s2 rrr, no m/r/g PULM: Mild rhonchi BU:LAGT, non-tender, bsx4 active  Extremities: Rt old transmetarsal amd left foot dressing in tact Skin: no rashes or  lesions   LABS:  BMET  Recent Labs Lab 02/15/17 0530 02/16/17 0337 02/17/17 0345  NA 137 135 134*  K 4.4 4.5 5.8*  CL 95* 94* 95*  CO2 23 22 23   BUN 60* 70* 81*  CREATININE 4.29* 4.73* 5.37*  GLUCOSE 107* 65 108*    Electrolytes  Recent Labs Lab 02/15/17 0530  02/16/17 0337 02/16/17 1201 02/16/17 1622 02/17/17 0345  CALCIUM 7.2*  --  6.8*  --   --  7.0*  MG  --   < > 1.9 1.9 2.1 2.1  PHOS  --   < > 6.8* 7.6* 8.3* 9.5*  < > = values in this interval not displayed.  CBC  Recent Labs Lab 02/15/17 0530 02/16/17 0337 02/17/17 0345  WBC 27.6* 30.2* 36.7*  HGB 8.4* 8.1* 7.5*  HCT 26.3* 24.5* 23.2*  PLT 143* 145* 146*    Coag's  Recent Labs Lab 02/14/17 1806 02/15/17 0530  APTT 39*  --   INR 1.46 1.52    Sepsis Markers  Recent Labs Lab 02/14/17 1141 02/14/17 1624 02/14/17 1807  LATICACIDVEN 1.1 3.4* 4.6*    ABG  Recent Labs Lab 02/15/17 0415 02/16/17 0355 02/17/17 0416  PHART 7.441 7.461* 7.352  PCO2ART 36.0 31.8* 42.6  PO2ART 402* 154* 148*    Liver Enzymes  Recent Labs Lab 02/14/17 1141 02/15/17 0530 02/17/17 0345  AST 16 106* 32  ALT 17 69* 42  ALKPHOS 181* 184* 172*  BILITOT 4.1* 5.6* 11.6*  ALBUMIN 2.5* 2.1* 1.8*  Cardiac Enzymes  Recent Labs Lab 02/14/17 2327 02/15/17 0530 02/15/17 1259  TROPONINI 0.23* 0.22* 0.16*    Glucose  Recent Labs Lab 02/16/17 1211 02/16/17 1635 02/16/17 2022 02/16/17 2333 02/17/17 0342 02/17/17 0730  GLUCAP 89 81 79 85 106* 108*   STUDIES:  Lower extremity ultrasound 02/13/17> no evidence of DVT CT head 02/14/17> normal exam Chest x-ray 02/14/17> cardiomegaly, ETT, left IJ in place. I have reviewed all images personally Echo> EF 30-35%, increased LVF. Diffuse hypokinesis, akinesis of basilar mid-inferior myocardium, grade 2 diastolic dysfxn  EEG 5/20: finding c/w mod to severe global cerebral dysfxn. C/w anoxic injury given clinical course  LE [place]  5/21>>>neg  CULTURES: Bcx 5/19 X 2 >   ANTIBIOTICS: Clinidamycin 5/19 >5/21 Vanco 5/19 > Zosyn 5/19 >   SIGNIFICANT EVENTS: 5/19 - Admit, cardiac arrest, transfer to ICU  LINES/TUBES: Oett 5/19>> CVL 5/19>>  DISCUSSION:  ASSESSMENT / PLAN:  PULMONARY A: Acute respiratory failure Iatrogenic respiratory alkalosis  -CXR personally reviewed. Rotated film. ETT and CVL good. Aeration on right w increased haziness which could reflect asymmetric edema vs possibly aspiration event (doubt) -main barrier to extubation is mental status P:   Cont full vent support Volume removal w/ HD on 5/22 Repeat CXR 5/22 RAS goal 0  CARDIOVASCULAR A:  Asystolic cardiac arrest Acute systolic CM: EF has declined from 35-40%-->30-35%.  Grade II DDysfxn Troponin elevation -etiology unclear of arrest.  P:  Cont tele KVO IVF Levophed for MAP > 65 Holding home: coreg, lipitor and isosorbide Get LE [place] ro DVT, neg 5/21  RENAL A:   ESRD on HD Fluid and electrolyte disturbance  P:   HD planned for 5/22 Trend chemistry  Replace lytes as needed   GASTROINTESTINAL A:   Shock liver  Protein calorie malnutrition  P:   Trend LFTs, improved 5/22 Started tube feeds 5/21  HEMATOLOGIC  Recent Labs  02/16/17 0337 02/17/17 0345  HGB 8.1* 7.5*    A:   Leukocytosis from sepsis Anemia of critical illness Mild thrombocytopenia  P:  Trend cbc Transfuse per protocol  Cont [place] heparin    INFECTIOUS A:   Left foot osteomyelitis -wbc remains elevated. ? Etiology  P:   Cont vanc/zosyn day # 4;   ENDOCRINE A:   Diabetes mellitus P: ssi     NEUROLOGIC A:   Acute encephalopathy after cardiac arrest-->favor anoxia but metabolic derangements still on ddx  ? myoclonic activity vs seizures.  -EEG c/w diffuse slowing and possibly anoxic injury P:   Supportive care  Dc sedation Plan for repeat EEG and CT head vs MRI on 5/23 to better prognosticate.   FAMILY  - Updates: Mom and  sister updated at bedside. Prognosis is gaurded - Inter-disciplinary family meet or Palliative Care meeting due by: 02/22/16  Discussion 35 yof w/ ESRD. S/p cardiac arrest 5/19 in setting of septic limb/osteo. Time to ROSC estimated at 9 minutes. Down time leading up to this could be up to 2 hours. She has completed hypothermia protocol. Remains in COMA. Will continue supportive care including: Treat osteo Start tube feeds Cont mechanical ventilation Trend and treat electrolyte imbalance  Plan for HD 5/22 -on 5/23 if no clinical improvement in mental status will need to repeat EEG and get either repeat CT head OR MRI.  Family updated 5/22  My ccm time 30 minutes.Brett Canales Minor ACNP [facility] PCC[facility] Pager [phone] till 3 pm If no answer page [phone] 02/17/2017, 9:49 AM   STAFF  NOTE: I, Rory Percy, MD FACP have personally reviewed patient's available data, including medical history, events of note, physical examination and test results as part of my evaluation. I have discussed with resident/NP and other care providers such as pharmacist, RN and RRT. In addition, I personally evaluated patient and elicited key findings of: not awake, openes eyes, pos dolls, WD to pain, coarse BS, clinical c/w prolonged down time post arrest in setting of MUltiple organ failures and osteom, eeg on 5/20 I reviewed concerning, for repeat CT and eeg, but appearing to have a poor outcome, bili noted, etiology NOT clear to me, steatosis? Sepsis?, assess fractionated bili, consider [place] RUQ, for HD today, WBC also noted, assess pCT further, remains culture neg, are we missing the organism, consider change zosyn to meropenem, may need repeat imaging in this circumstance, poor prognosis, will need family meeting wed, attempts at weaning ps 15-20  The patient is critically ill with multiple organ systems failure and requires high complexity decision making for assessment and support, frequent evaluation and titration  of therapies, application of advanced monitoring technologies and extensive interpretation of multiple databases.   Critical Care Time devoted to patient care services described in this note is 35 Minutes. This time reflects time of care of this signee: Rory Percy, MD FACP. This critical care time does not reflect procedure time, or teaching time or supervisory time of PA/NP/Med student/Med Resident etc but could involve care discussion time. Rest per NP/medical resident whose note is outlined above and that I agree with   Mcarthur Rossetti. Tyson Alias, MD, FACP Pgr: [phone] [place] [facility] 02/17/2017 10:17 AM

## 2017-02-17 NOTE — Progress Notes (Signed)
Pharmacy Antibiotic Note  [name] is a 36 y.o. female admitted on 02/14/2017 with diarrhea, chills and LLE drainage 2/2 osteomyelitis. She is s/p revision of L TMA 4/25, now with wound dehiscence.  Pharmacy has been consulted to dose vancomycin and change zosyn to meropenem due to worsening clinical status. Patient is ESRD on HD TTS.  Patients WBC increased to 36.7 and temperatures normothermic. Blood cultures remain negative.   Plan: - Meropenem 500mg  IV every 24 hours at 1800 - Continue vancomycin 1000mg  IV with HD every TTS - Monitor HD tolerance, clinical progress, VR as indicated   Height: 5' 8.5" (174 cm) Weight: 278 lb (126.1 kg) (subtracted pads with water) IBW/kg (Calculated) : 65.05  Temp (24hrs), Avg:98.5 F (36.9 C), Min:97.5 F (36.4 C), Max:98.8 F (37.1 C)   Recent Labs Lab 02/14/17 0526 02/14/17 0839 02/14/17 1141 02/14/17 1624 02/14/17 1806 02/14/17 1807 02/15/17 0530 02/16/17 0337 02/17/17 0345  WBC 18.9*  --   --   --   --   --  27.6* 30.2* 36.7*  CREATININE 5.98*  --  6.23*  --  6.49*  --  4.29* 4.73* 5.37*  LATICACIDVEN  --  1.45 1.1 3.4*  --  4.6*  --   --   --     Estimated Creatinine Clearance: 20.3 mL/min (A) (by C-G formula based on SCr of 5.37 mg/dL (H)).    Allergies  Allergen Reactions  . Aspirin Anaphylaxis  . Sulfur Hives  . Tramadol Hives and Other (See Comments)    Pt states she feels weird   . Gabapentin Other (See Comments)    Lethargic     Antimicrobials this admission: Vanc 5/19>>  Zosyn 5/19>> 5/22 Clindamycin 5/19>> 5/21 Meropenem 5/22 >>   Microbiology results: 5/19 BCx: ngtd 5/19:  MRSA PCR: neg  Carylon Perches, PharmD Acute Care Pharmacy Resident  Pager: [phone] 02/17/2017

## 2017-02-17 NOTE — Telephone Encounter (Signed)
I called and sw [name] and advised that the pt is in the ICU. She states that she looked in Epic and did not realize that she was in the hospital. Services will be on hold pending hospital course.

## 2017-02-17 NOTE — Progress Notes (Signed)
[facility] Progress Note   Subjective:  Remains unresponsive on ventslowing Off sedation since 8AM yesterday Some response to painful stimuli but nothing purposeful Off norepi  Vitals:   02/17/17 0830 02/17/17 0833 02/17/17 0834 02/17/17 0900  BP: (!) 110/59   (!) 106/59  Pulse: 98 100  99  Resp: 20 (!) 21  19  Temp:      TempSrc:      SpO2: 100% 100% 99% 99%  Weight:      Height:       Exam: On vent, grimace to painful stimuli (Sedation off since 8AM 5/21) L IJ CVL Orally intubated/OG tube  No jvd Lungs clear anteriorly fairly clear Regular rhythm  Cooling device in place chest/abd L foot bandaged, somewhat malodorous R foot metatarsal amp Chronic woody changes both LE's with pitting thighs and calves LUA AVF +bruit  Inpatient medications: . calcium carbonate (dosed in mg elemental calcium)  2,000 mg of elemental calcium Oral TID  . chlorhexidine gluconate (MEDLINE KIT)  15 mL Mouth Rinse BID  . Chlorhexidine Gluconate Cloth  6 each Topical Q0600  . darbepoetin (ARANESP) injection - DIALYSIS  100 mcg Intravenous Q Tue-HD  . feeding supplement (NEPRO CARB STEADY)  1,000 mL Per Tube Q24H  . feeding supplement (PRO-STAT SUGAR FREE 64)  60 mL Per Tube QID  . heparin  5,000 Units Subcutaneous Q8H  . insulin aspart  0-9 Units Subcutaneous Q4H  . mouth rinse  15 mL Mouth Rinse 10 times per day  . sodium polystyrene  15 g Per Tube Once   . sodium chloride 10 mL/hr at 02/17/17 0900  . sodium chloride 10 mL/hr at 02/17/17 0900  . famotidine (PEPCID) IV Stopped (02/16/17 2242)  . norepinephrine (LEVOPHED) Adult infusion Stopped (02/17/17 0920)  . piperacillin-tazobactam 3.375 g (02/17/17 0907)  . vancomycin     albuterol, budesonide, fentaNYL (SUBLIMAZE) injection, midazolam  CXR - no active disease 5/19 am CXR 5/21 Stable CM ECHO 5/20 30-35%, Grade 2  CXR 5/22 mild vasc congestion   ABG    Component Value Date/Time   PHART 7.352 02/17/2017 0416    PCO2ART 42.6 02/17/2017 0416   PO2ART 148 (H) 02/17/2017 0416   HCO3 23.0 02/17/2017 0416   TCO2 21 02/14/2017 1835   ACIDBASEDEF 1.7 02/17/2017 0416   O2SAT 98.7 02/17/2017 0416    Recent Labs Lab 02/15/17 0530  02/16/17 0337 02/16/17 1201 02/16/17 1622 02/17/17 0345  NA 137  --  135  --   --  134*  K 4.4  --  4.5  --   --  5.8*  CL 95*  --  94*  --   --  95*  CO2 23  --  22  --   --  23  GLUCOSE 107*  --  65  --   --  108*  BUN 60*  --  70*  --   --  81*  CREATININE 4.29*  --  4.73*  --   --  5.37*  CALCIUM 7.2*  --  6.8*  --   --  7.0*  PHOS  --   < > 6.8* 7.6* 8.3* 9.5*  < > = values in this interval not displayed.  Recent Labs Lab 02/14/17 1141 02/15/17 0530 02/17/17 0345  AST 16 106* 32  ALT 17 69* 42  ALKPHOS 181* 184* 172*  BILITOT 4.1* 5.6* 11.6*  PROT 5.8* 5.3* 5.3*  ALBUMIN 2.5* 2.1* 1.8*    Recent Labs Lab 02/14/17  5848 02/15/17 0530 02/16/17 0337 02/17/17 0345  WBC 18.9* 27.6* 30.2* 36.7*  NEUTROABS 17.2*  --   --   --   HGB 10.0* 8.4* 8.1* 7.5*  HCT 30.8* 26.3* 24.5* 23.2*  MCV 92.5 91.6 91.4 92.4  PLT 167 143* 145* 146*   Dialysis: TTS [facility] 4h 42mn   114kg   2/2 bath LUA AVF - venofer 50/wk - mircera 75 every 2 wks, last 5/10 - calcitriol 0.75 ug tiw  Summary: 36year old female w/ ESRD d/t poorly controlled DM, HD bilateral TMA's, obesity, HTN, medical non-compliance, NICM. Admitted 5/19 w/left leg pain, found to have radiological evidence of osteomyelitis with wound nonhealing, dehiscence, purulent drainage. Admitted for IV antibiotics, hemodialysis and possible surgery. Found unresponsive in the room on 5/19. Rec'd chest compressions, epi bicarb; intubate; cooled. We are providing HD.    Assessment: 1. L foot osteo - s/p revision L TMA 4/25, now dehisced. on IV Abx (V/Z). WBC up to 36K.  2. Acute resp failure; on vent. Sedation off now. Pain response only 3. Cardiac arrest (5/19, asystole). Small decline EF (now 30-35%) w/Gr 2 DD.  Not sure etiology. S/p cooling protocol 4. ESRD TTS HD. Next HD today. K 5.8. May be a little while before can get her on HD d/t staffing issues. Rx small dose kayexalate 15 gm for K 5.8.  5. NICM EF 35-40% 6. Shock liver - persistent elevation bili (now up to almost 12), alk phos 7. DM2 8. COPD 9. CKD-MBD - changed Renvela to CaCO3 susp down tube and hold off on change in rocaltrol to IV for now 10. Anemia - Last ESA was Mircera 75 mcg 5/10. Start Darbe 5/22. 11. Volume - EDW 114. Weights varying from 108->126 (?). CXR "stable vascular congestion". Off NE now. Mod woody and pitting edema LE's. Try for 1-2 liters. 12. Disposition - unclear degree of anoxic brain injury but certainly not waking up off sedation. I am told that re EEG and re-imaging planned for tomorrow. Prognosis looks rather grim.  CJamal Maes MD [facility] [phone] 02/17/2017, 9:37 AM

## 2017-02-17 NOTE — Progress Notes (Addendum)
**  Preliminary report by tech**  Bilateral lower extremity venous duplex completed. There is no obvious evidence of deep or superficial vein thrombosis involving the right and left lower extremities. All clearly visualized vessels appear patent and compressible. There is no evidence of Baker's cysts bilaterally. Results were given to the patient's nurse, Heather.  02/17/17 2:11 PM Olen Cordial RVT

## 2017-02-18 ENCOUNTER — Ambulatory Visit: Payer: Medicaid Other | Admitting: Vascular Surgery

## 2017-02-18 ENCOUNTER — Inpatient Hospital Stay (HOSPITAL_COMMUNITY): Payer: Medicaid Other

## 2017-02-18 LAB — GLUCOSE, CAPILLARY
GLUCOSE-CAPILLARY: 101 mg/dL — AB (ref 65–99)
GLUCOSE-CAPILLARY: 115 mg/dL — AB (ref 65–99)
GLUCOSE-CAPILLARY: 129 mg/dL — AB (ref 65–99)
Glucose-Capillary: 100 mg/dL — ABNORMAL HIGH (ref 65–99)
Glucose-Capillary: 108 mg/dL — ABNORMAL HIGH (ref 65–99)
Glucose-Capillary: 117 mg/dL — ABNORMAL HIGH (ref 65–99)

## 2017-02-18 LAB — CBC
HCT: 21 % — ABNORMAL LOW (ref 36.0–46.0)
HEMATOCRIT: 23 % — AB (ref 36.0–46.0)
HEMOGLOBIN: 6.8 g/dL — AB (ref 12.0–15.0)
HEMOGLOBIN: 7.4 g/dL — AB (ref 12.0–15.0)
MCH: 30 pg (ref 26.0–34.0)
MCH: 29.6 pg (ref 26.0–34.0)
MCHC: 32.4 g/dL (ref 30.0–36.0)
MCHC: 32.2 g/dL (ref 30.0–36.0)
MCV: 92.5 fL (ref 78.0–100.0)
MCV: 92 fL (ref 78.0–100.0)
Platelets: 135 10*3/uL — ABNORMAL LOW (ref 150–400)
Platelets: 144 10*3/uL — ABNORMAL LOW (ref 150–400)
RBC: 2.27 MIL/uL — AB (ref 3.87–5.11)
RBC: 2.5 MIL/uL — ABNORMAL LOW (ref 3.87–5.11)
RDW: 17.1 % — ABNORMAL HIGH (ref 11.5–15.5)
RDW: 16.7 % — AB (ref 11.5–15.5)
WBC: 32.3 10*3/uL — ABNORMAL HIGH (ref 4.0–10.5)
WBC: 30.3 10*3/uL — ABNORMAL HIGH (ref 4.0–10.5)

## 2017-02-18 LAB — DIC (DISSEMINATED INTRAVASCULAR COAGULATION) PANEL
APTT: 42 s — AB (ref 24–36)
D DIMER QUANT: 3.11 ug{FEU}/mL — AB (ref 0.00–0.50)
SMEAR REVIEW: NONE SEEN

## 2017-02-18 LAB — MAGNESIUM: MAGNESIUM: 1.9 mg/dL (ref 1.7–2.4)

## 2017-02-18 LAB — BASIC METABOLIC PANEL
Anion gap: 15 (ref 5–15)
BUN: 43 mg/dL — ABNORMAL HIGH (ref 6–20)
CALCIUM: 7.5 mg/dL — AB (ref 8.9–10.3)
CHLORIDE: 96 mmol/L — AB (ref 101–111)
CO2: 26 mmol/L (ref 22–32)
Creatinine, Ser: 3.37 mg/dL — ABNORMAL HIGH (ref 0.44–1.00)
GFR calc Af Amer: 19 mL/min — ABNORMAL LOW (ref >60)
GFR calc non Af Amer: 16 mL/min — ABNORMAL LOW (ref >60)
GLUCOSE: 98 mg/dL (ref 65–99)
Potassium: 4.8 mmol/L (ref 3.5–5.1)
Sodium: 137 mmol/L (ref 135–145)

## 2017-02-18 LAB — IRON AND TIBC
Iron: 17 ug/dL — ABNORMAL LOW (ref 28–170)
Saturation Ratios: 17 % (ref 10.4–31.8)
TIBC: 102 ug/dL — ABNORMAL LOW (ref 250–450)
UIBC: 85 ug/dL

## 2017-02-18 LAB — LACTATE DEHYDROGENASE: LDH: 159 U/L (ref 98–192)

## 2017-02-18 LAB — RETICULOCYTES
RBC.: 2.48 MIL/uL — AB (ref 3.87–5.11)
RETIC COUNT ABSOLUTE: 47.1 10*3/uL (ref 19.0–186.0)
RETIC CT PCT: 1.9 % (ref 0.4–3.1)

## 2017-02-18 LAB — DIC (DISSEMINATED INTRAVASCULAR COAGULATION)PANEL
INR: 1.38
Platelets: 132 10*3/uL — ABNORMAL LOW (ref 150–400)
Prothrombin Time: 17 seconds — ABNORMAL HIGH (ref 11.4–15.2)

## 2017-02-18 LAB — FOLATE: Folate: 12.8 ng/mL (ref >5.9)

## 2017-02-18 LAB — SAVE SMEAR

## 2017-02-18 LAB — FERRITIN: Ferritin: 466 ng/mL — ABNORMAL HIGH (ref 11–307)

## 2017-02-18 LAB — PHOSPHORUS: Phosphorus: 6.1 mg/dL — ABNORMAL HIGH (ref 2.5–4.6)

## 2017-02-18 LAB — VITAMIN B12: VITAMIN B 12: 2988 pg/mL — AB (ref 180–914)

## 2017-02-18 LAB — PREPARE RBC (CROSSMATCH)

## 2017-02-18 LAB — ABO/RH: ABO/RH(D): O POS

## 2017-02-18 MED ORDER — CHLORHEXIDINE GLUCONATE 0.12 % MT SOLN
OROMUCOSAL | Status: AC
  Filled 2017-02-18: qty 15

## 2017-02-18 NOTE — Progress Notes (Signed)
Called to see if pt was available. Pt went to MRI then Dialysis started. Will attempt EEG in the morning as schedule permits.

## 2017-02-18 NOTE — Progress Notes (Signed)
Returned from MRI 

## 2017-02-18 NOTE — Progress Notes (Signed)
eLink Physician-Brief Progress Note Patient Name: [name] DOB: [birth year] MRN: [id number]   Date of Service  02/18/2017  HPI/Events of Note   Recent Labs Lab 02/16/17 0337 02/17/17 0345 02/18/17 0300  HGB 8.1* 7.5* 6.8*  HCT 24.5* 23.2* 21.0*  WBC 30.2* 36.7* 32.3*  PLT 145* 146* 135*     eICU Interventions  1u PRBC given     Intervention Category Intermediate Interventions: Other:  Jabier Deese S. 02/18/2017, 3:41 AM

## 2017-02-18 NOTE — Progress Notes (Signed)
CRITICAL VALUE ALERT  Critical Value:  Hb 6.8  Date & Time Notied:  02/18/17 3244  Provider Notified: Dr. Delton Coombes  Orders Received/Actions taken: awaiting order

## 2017-02-18 NOTE — Progress Notes (Signed)
PULMONARY / CRITICAL CARE MEDICINE   Name: [name] MRN: [id number] DOB: 1981-01-14    ADMISSION DATE:  02/14/2017 CONSULTATION DATE:  02/14/17  REFERRING MD:  Feliz Beam MD  CHIEF COMPLAINT:  Sepsis, cardiac arrest  HISTORY OF PRESENT ILLNESS:   36 year old female w/ ESRD d/t poorly controlled DM, bilateral transmetatarsal foot amputations, obesity, HTN, medical non-compliance as well as NICM. Admitted 5/19 with complaints of left leg pain, found to have radiological evidence of osteomyelitis with wound nonhealing, dehiscence, purulent drainage from the wound. Admitted for IV antibiotics, hemodialysis and possible surgery.  Found unresponsive in the room on 5/19. She had chest compressions, epi bicarb given. She was intubated by anesthesia and had ROSC in approximately 9 minutes. Downtime prior to code is unknown. She got dilaudid in the afternoon for leg pain.  SUBJECTIVE:  Will open eyes to voice does not f/c  VITAL SIGNS: BP (!) 106/58 (BP Location: Right Arm)   Pulse 96   Temp 99.7 F (37.6 C) (Oral)   Resp (!) 25   Ht 5' 8.5" (1.74 m)   Wt 281 lb 15.5 oz (127.9 kg)   LMP  (LMP Unknown)   SpO2 99%   BMI 42.25 kg/m   HEMODYNAMICS: CVP:  [12 mmHg-16 mmHg] 16 mmHg  VENTILATOR SETTINGS: Vent Mode: CPAP;PSV FiO2 (%):  [40 %] 40 % Set Rate:  [18 bmp] 18 bmp Vt Set:  [520 mL] 520 mL PEEP:  [5 cmH20] 5 cmH20 Pressure Support:  [5 cmH20-8 cmH20] 8 cmH20 Plateau Pressure:  [16 cmH20-24 cmH20] 23 cmH20  INTAKE / OUTPUT:  Intake/Output Summary (Last 24 hours) at 02/18/17 5456 Last data filed at 02/18/17 0741  Gross per 24 hour  Intake          1306.67 ml  Output             1000 ml  Net           306.67 ml     General appearance:  Chronically ill appearing 36 Year old  female, remains on full support, now will open eyes to voice  Eyes: sclerae icteric , moist conjunctivae; PERRL, EOMI bilaterally. Mouth:  membranes and no mucosal ulcerations; normal hard and soft  palate, orally intubated  Neck: Trachea midline; neck supple, no JVD, Left IJ unremarkable  Lungs/chest: scattered rhonchi/ decreased on left, with normal respiratory effort and no intercostal retractions CV: RRR, no MRGs  Abdomen: Soft, non-tender; no masses or HSM Extremities: No peripheral edema or extremity lymphadenopathy Skin: Normal temperature, turgor and texture; no rash, ulcers or subcutaneous nodules, left foot dressing intact  Neuro/ Psych: opens eyes to voice. Moves lower extremities only w/ painful stim. + cough     LABS:  BMET  Recent Labs Lab 02/16/17 0337 02/17/17 0345 02/18/17 0300  NA 135 134* 137  K 4.5 5.8* 4.8  CL 94* 95* 96*  CO2 22 23 26   BUN 70* 81* 43*  CREATININE 4.73* 5.37* 3.37*  GLUCOSE 65 108* 98    Electrolytes  Recent Labs Lab 02/16/17 0337  02/17/17 0345 02/17/17 1739 02/18/17 0300  CALCIUM 6.8*  --  7.0*  --  7.5*  MG 1.9  < > 2.1 2.0 1.9  PHOS 6.8*  < > 9.5* 5.0* 6.1*  < > = values in this interval not displayed.  CBC  Recent Labs Lab 02/16/17 0337 02/17/17 0345 02/18/17 0300  WBC 30.2* 36.7* 32.3*  HGB 8.1* 7.5* 6.8*  HCT 24.5* 23.2* 21.0*  PLT 145* 146* 135*    Coag's  Recent Labs Lab 02/14/17 1806 02/15/17 0530  APTT 39*  --   INR 1.46 1.52    Sepsis Markers  Recent Labs Lab 02/14/17 1141 02/14/17 1624 02/14/17 1807  LATICACIDVEN 1.1 3.4* 4.6*    ABG  Recent Labs Lab 02/15/17 0415 02/16/17 0355 02/17/17 0416  PHART 7.441 7.461* 7.352  PCO2ART 36.0 31.8* 42.6  PO2ART 402* 154* 148*    Liver Enzymes  Recent Labs Lab 02/14/17 1141 02/15/17 0530 02/17/17 0345 02/17/17 1107  AST 16 106* 32  --   ALT 17 69* 42  --   ALKPHOS 181* 184* 172*  --   BILITOT 4.1* 5.6* 11.6* 12.0*  ALBUMIN 2.5* 2.1* 1.8*  --     Cardiac Enzymes  Recent Labs Lab 02/14/17 2327 02/15/17 0530 02/15/17 1259  TROPONINI 0.23* 0.22* 0.16*    Glucose  Recent Labs Lab 02/17/17 1155 02/17/17 1633  02/17/17 1923 02/17/17 2332 02/18/17 0347 02/18/17 0759  GLUCAP 146* 129* 103* 101* 100* 108*   STUDIES:  Lower extremity ultrasound 02/13/17> no evidence of DVT CT head 02/14/17> normal exam Chest x-ray 02/14/17> cardiomegaly, ETT, left IJ in place. I have reviewed all images personally Echo> EF 30-35%, increased LVF. Diffuse hypokinesis, akinesis of basilar mid-inferior myocardium, grade 2 diastolic dysfxn  EEG 5/20: finding c/w mod to severe global cerebral dysfxn. C/w anoxic injury given clinical course  LE [place] 5/21>>>neg  CULTURES: Bcx 5/19 X 2 >   ANTIBIOTICS: Clinidamycin 5/19 >5/21 Vanco 5/19 > Zosyn 5/19 >5/22 Meropenem 5/22>>>  SIGNIFICANT EVENTS: 5/19 - Admit, cardiac arrest, transfer to ICU  LINES/TUBES: Oett 5/19>> CVL 5/19>>   ASSESSMENT / PLAN:  PULMONARY A: Acute respiratory failure  Ventilator dependence s/p cardiac arrest  Pulmonary edema -CXR personally reviewed: marked increase in pulmonary edema c/w 5/21  -MS barrier to extubation P Cont full vent support  Daily SBT Holding sedating meds.  Will need more volume removed from HD  CARDIOVASCULAR A:  Asystolic arrest Acute systolic CM w/ EF decline from 35-40% down to 30-35% Grade II DD Troponin elevation -shock resolved. Off pressors.  P Cont tele Holding coreg, lipitor and isosorbide KVO IVF (keep even)  RENAL A:   ESRD Fluid and electrolyte d/o P: HD per renal Trend fluids and lytes; replace and recheck as indicated  GASTROINTESTINAL A:   Shock liver  Hyperbilirubinemia: ->abd [place] neg for stones of evidence of cholecystitis or obstruction; there is mention of hypoechoic lesion on liver.  Protein calorie malnutrition  ->total bilirubin cont to rise. Etiology unclear? Intrahepatic? Hemolytic?  P:   Cont tube feeds Ck anemia panel  If remains elevated will need GI consult  spoke to radiology. Will f/u LFTs in am. In Short term might do CT w/ & without contrast if WBCs remain  elevated and LFTs/bili w/out change. Level of suspicion for abscess low. MRI would be ideal but has to be able to hold breath   HEMATOLOGIC A:   Anemia of critical illness.  -transfused last night for hgb 6.8.  P:  Hemoccult stool Trend CBC Cont [place] heparin  Checking for evidence of hemolysis   INFECTIOUS A:   Left foot Osteo Leukocytosis persists -culture to date neg P: abx day #5  Meropenem day 2/vanc #5  ENDOCRINE A:   DM P: ssi  NEUROLOGIC A:   Acute Encephalopathy. Presumed hypoxic/ischemic +/- metabolic given -initial EEG-->diffuse slowing; CT head neg  P:   MRI brain Repeat EEG RAS  goal 0  DISCUSSION: S/p cardiac arrest. Presume that this was mix of sedating meds & sepsis superimposed on top of ESRD. She had 9 minutes to ROSC and unknown time of hypoperfusion prior to being found.  Major issues at this point Encephalopathy-->MRI brain repeat EEG resp failure-->weaning as tol/will need more volume removal w/ HD Jaundice and rising total bili-->US neg; checking for hemolysis  Anemia->f/u cbc after transfusion  Osteo-->no change in abx.   My critical care time 78 minute  FAMILY  Sister and mother updated at length on plan of care Goals of care pending neuro-diagnositics  Simonne Martinet ACNP-BC [facility] Pulmonary/Critical Care Pager # [phone] OR # [phone] if no answer  02/18/2017 8:11 AM

## 2017-02-18 NOTE — Progress Notes (Signed)
Recheck of BP in MRI.

## 2017-02-18 NOTE — Progress Notes (Signed)
In MRI - 2mg  versed given for sedation

## 2017-02-18 NOTE — Progress Notes (Signed)
[facility] Progress Note   Subjective:  Opening eyes to voice for others (not for me) Had HD yesterday but low BP limited UF (and vol xs impediment to weaning)  Vitals:   02/18/17 0711 02/18/17 0736 02/18/17 0741 02/18/17 0800  BP: 111/67  (!) 106/58 (!) 101/58  Pulse: (!) 102 100 96 96  Resp: 20 (!) 26 (!) 25 (!) 24  Temp: 98.5 F (36.9 C)  99.7 F (37.6 C)   TempSrc: Oral  Oral   SpO2: 100% 99% 99% 100%  Weight:      Height:       Exam: (Sedation off since 8AM 5/21) L IJ CVL Orally intubated/OG tube  No jvd Lungs coarse BS Regular rhythm  L foot bandaged, somewhat malodorous R foot metatarsal amp Chronic woody changes both LE's with pitting thighs and calves LUA AVF +bruit  Inpatient medications: . calcium carbonate (dosed in mg elemental calcium)  2,000 mg of elemental calcium Oral TID  . chlorhexidine gluconate (MEDLINE KIT)  15 mL Mouth Rinse BID  . Chlorhexidine Gluconate Cloth  6 each Topical Q0600  . darbepoetin (ARANESP) injection - DIALYSIS  100 mcg Intravenous Q Tue-HD  . feeding supplement (NEPRO CARB STEADY)  1,000 mL Per Tube Q24H  . feeding supplement (PRO-STAT SUGAR FREE 64)  60 mL Per Tube QID  . heparin  5,000 Units Subcutaneous Q8H  . insulin aspart  0-9 Units Subcutaneous Q4H  . mouth rinse  15 mL Mouth Rinse 10 times per day   . sodium chloride 10 mL/hr at 02/18/17 0741  . sodium chloride 10 mL/hr at 02/17/17 1900  . famotidine (PEPCID) IV Stopped (02/17/17 2154)  . meropenem (MERREM) IV 500 mg (02/17/17 1825)  . norepinephrine (LEVOPHED) Adult infusion Stopped (02/17/17 0920)  . vancomycin Stopped (02/17/17 1650)   albuterol, budesonide, fentaNYL (SUBLIMAZE) injection, midazolam  CXR - no active disease 5/19 am CXR 5/21 Stable CM ECHO 5/20 30-35%, Grade 2  CXR 5/22 mild vasc congestion CXR 5/23 worsening vascular congestion   ABG    Component Value Date/Time   PHART 7.352 02/17/2017 0416   PCO2ART 42.6 02/17/2017  0416   PO2ART 148 (H) 02/17/2017 0416   HCO3 23.0 02/17/2017 0416   TCO2 21 02/14/2017 1835   ACIDBASEDEF 1.7 02/17/2017 0416   O2SAT 98.7 02/17/2017 0416    Recent Labs Lab 02/16/17 0337  02/17/17 0345 02/17/17 1739 02/18/17 0300  NA 135  --  134*  --  137  K 4.5  --  5.8*  --  4.8  CL 94*  --  95*  --  96*  CO2 22  --  23  --  26  GLUCOSE 65  --  108*  --  98  BUN 70*  --  81*  --  43*  CREATININE 4.73*  --  5.37*  --  3.37*  CALCIUM 6.8*  --  7.0*  --  7.5*  PHOS 6.8*  < > 9.5* 5.0* 6.1*  < > = values in this interval not displayed.  Recent Labs Lab 02/14/17 1141 02/15/17 0530 02/17/17 0345 02/17/17 1107  AST 16 106* 32  --   ALT 17 69* 42  --   ALKPHOS 181* 184* 172*  --   BILITOT 4.1* 5.6* 11.6* 12.0*  PROT 5.8* 5.3* 5.3*  --   ALBUMIN 2.5* 2.1* 1.8*  --     Recent Labs Lab 02/14/17 0526  02/17/17 0345 02/18/17 0300 02/18/17 0802  WBC 18.9*  < >  36.7* 32.3* 30.3*  NEUTROABS 17.2*  --   --   --   --   HGB 10.0*  < > 7.5* 6.8* 7.4*  HCT 30.8*  < > 23.2* 21.0* 23.0*  MCV 92.5  < > 92.4 92.5 92.0  PLT 167  < > 146* 135* 144*  < > = values in this interval not displayed.  Dialysis: TTS [facility] 4h 50mn    114kg    2/2 bath  LUA AVF - venofer 50/wk - mircera 75 every 2 wks, last 5/10 - calcitriol 0.75 ug tiw  Summary: 36year old female w/ ESRD d/t poorly controlled DM, HD bilateral TMA's, obesity, HTN, medical non-compliance, NICM. Admitted 5/19 w/left leg pain, found to have radiological evidence of osteomyelitis with wound nonhealing, dehiscence, purulent drainage. Admitted for IV antibiotics, hemodialysis and possible surgery. Found unresponsive in the room on 5/19. Rec'd chest compressions, epi bicarb; intubate; cooled. We are providing HD.    Assessment: 1. L foot osteo - s/p revision L TMA 4/25, now dehisced. on IV Abx (V/Z). WBC 30's 2. Acute resp failure; on vent. Sedation off now. Some improvement in responsiveness 3. Cardiac arrest (5/19,  asystole). Small decline EF (now 30-35%) w/Gr 2 DD. Not sure etiology. S/p cooling protocol 4. ESRD TTS HD. HD 5/22. Low BP's were prohibitive of fluid removal. Have spoken to CCM - will re-add levophed to facilitate UF (if not effective will need to move to CRRT for volume management).   5. NICM EF 35-40% 6. Shock liver - persistent elevation bili (now up to almost 12), alk phos 7. DM2 8. COPD 9. CKD-MBD - changed Renvela to CaCO3 susp down tube and hold off on change in rocaltrol to IV for now 10. Anemia - Last ESA was Mircera 75 mcg 5/10. Started Darbe 100 given 5/22. S/p PRBC's X 1 for Hb 6.8 this AM. 11. Volume - EDW 114. Weights varying from 108->126 (?). CXR "worsening vascular congestion". Mod woody and pitting edema LE's. Will need to start back levophed as above to get volume off with HD (and may need to continue it after) 12. Disposition - unclear degree of anoxic brain injury. Possibly some improvement. For repeat MRI today.    CJamal Maes MD [facility] [phone] 02/18/2017, 10:24 AM

## 2017-02-19 ENCOUNTER — Inpatient Hospital Stay (HOSPITAL_COMMUNITY): Payer: Medicaid Other

## 2017-02-19 ENCOUNTER — Encounter: Payer: Self-pay | Admitting: *Deleted

## 2017-02-19 ENCOUNTER — Encounter (HOSPITAL_COMMUNITY): Payer: Self-pay | Admitting: Gastroenterology

## 2017-02-19 DIAGNOSIS — J9600 Acute respiratory failure, unspecified whether with hypoxia or hypercapnia: Secondary | ICD-10-CM

## 2017-02-19 LAB — CULTURE, BLOOD (ROUTINE X 2)
CULTURE: NO GROWTH
Culture: NO GROWTH
SPECIAL REQUESTS: ADEQUATE
SPECIAL REQUESTS: ADEQUATE

## 2017-02-19 LAB — CBC
HEMATOCRIT: 22.6 % — AB (ref 36.0–46.0)
Hemoglobin: 7.2 g/dL — ABNORMAL LOW (ref 12.0–15.0)
MCH: 29.4 pg (ref 26.0–34.0)
MCHC: 31.9 g/dL (ref 30.0–36.0)
MCV: 92.2 fL (ref 78.0–100.0)
Platelets: 151 10*3/uL (ref 150–400)
RBC: 2.45 MIL/uL — ABNORMAL LOW (ref 3.87–5.11)
RDW: 16.9 % — ABNORMAL HIGH (ref 11.5–15.5)
WBC: 24.3 10*3/uL — ABNORMAL HIGH (ref 4.0–10.5)

## 2017-02-19 LAB — COMPREHENSIVE METABOLIC PANEL
ALBUMIN: 1.5 g/dL — AB (ref 3.5–5.0)
ALK PHOS: 382 U/L — AB (ref 38–126)
ALT: 22 U/L (ref 14–54)
ANION GAP: 12 (ref 5–15)
AST: 15 U/L (ref 15–41)
BUN: 36 mg/dL — ABNORMAL HIGH (ref 6–20)
CALCIUM: 8.3 mg/dL — AB (ref 8.9–10.3)
CO2: 28 mmol/L (ref 22–32)
CREATININE: 3.19 mg/dL — AB (ref 0.44–1.00)
Chloride: 96 mmol/L — ABNORMAL LOW (ref 101–111)
GFR calc Af Amer: 20 mL/min — ABNORMAL LOW (ref >60)
GFR calc non Af Amer: 17 mL/min — ABNORMAL LOW (ref >60)
GLUCOSE: 132 mg/dL — AB (ref 65–99)
Potassium: 3.4 mmol/L — ABNORMAL LOW (ref 3.5–5.1)
Sodium: 136 mmol/L (ref 135–145)
TOTAL PROTEIN: 5 g/dL — AB (ref 6.5–8.1)
Total Bilirubin: 12.4 mg/dL — ABNORMAL HIGH (ref 0.3–1.2)

## 2017-02-19 LAB — GLUCOSE, CAPILLARY
GLUCOSE-CAPILLARY: 150 mg/dL — AB (ref 65–99)
GLUCOSE-CAPILLARY: 163 mg/dL — AB (ref 65–99)
Glucose-Capillary: 125 mg/dL — ABNORMAL HIGH (ref 65–99)
Glucose-Capillary: 135 mg/dL — ABNORMAL HIGH (ref 65–99)
Glucose-Capillary: 143 mg/dL — ABNORMAL HIGH (ref 65–99)
Glucose-Capillary: 154 mg/dL — ABNORMAL HIGH (ref 65–99)

## 2017-02-19 LAB — AMMONIA: AMMONIA: 24 umol/L (ref 9–35)

## 2017-02-19 MED ORDER — VANCOMYCIN HCL IN DEXTROSE 1-5 GM/200ML-% IV SOLN
1000.0000 mg | INTRAVENOUS | Status: DC
  Administered 2017-02-20: 1000 mg via INTRAVENOUS
  Filled 2017-02-19 (×2): qty 200

## 2017-02-19 MED ORDER — SODIUM CHLORIDE 0.9% FLUSH: 10.0000 mL | INTRAVENOUS | Status: DC | PRN

## 2017-02-19 MED ORDER — SODIUM CHLORIDE 0.9% FLUSH
10.0000 mL | Freq: Two times a day (BID) | INTRAVENOUS | Status: DC
  Administered 2017-02-19 – 2017-02-28 (×17): 10 mL

## 2017-02-19 NOTE — Progress Notes (Signed)
EEG Completed; Results Pending  

## 2017-02-19 NOTE — Progress Notes (Signed)
PULMONARY / CRITICAL CARE MEDICINE   Name: [name] MRN: [id number] DOB: 1980/10/13    ADMISSION DATE:  02/14/2017 CONSULTATION DATE:  02/14/17  REFERRING MD:  Legrand Pitts MD  CHIEF COMPLAINT:  Sepsis, cardiac arrest  HISTORY OF PRESENT ILLNESS:   36 year old female w/ ESRD d/t poorly controlled DM, bilateral transmetatarsal foot amputations, obesity, HTN, medical non-compliance as well as NICM. Admitted 5/19 with complaints of left leg pain, found to have radiological evidence of osteomyelitis with wound nonhealing, dehiscence, purulent drainage from the wound. Admitted for IV antibiotics, hemodialysis and possible surgery.  Found unresponsive in the room on 5/19. She had chest compressions, epi bicarb given. She was intubated by anesthesia and had ROSC in approximately 9 minutes. Downtime prior to code is unknown. She got dilaudid in the afternoon for leg pain.  SUBJECTIVE:  No acute events over the evening hours.  VITAL SIGNS: BP 124/68   Pulse 100   Temp 99.5 F (37.5 C) (Oral)   Resp 19   Ht 5' 8.5" (1.74 m)   Wt 263 lb 10.7 oz (119.6 kg)   LMP  (LMP Unknown)   SpO2 99%   BMI 39.51 kg/m   HEMODYNAMICS: CVP:  [12 mmHg-15 mmHg] 14 mmHg  VENTILATOR SETTINGS: Vent Mode: PRVC FiO2 (%):  [40 %] 40 % Set Rate:  [18 bmp] 18 bmp Vt Set:  [520 mL] 520 mL PEEP:  [5 cmH20] 5 cmH20 Pressure Support:  [8 cmH20] 8 cmH20 Plateau Pressure:  [21 cmH20-22 cmH20] 22 cmH20  INTAKE / OUTPUT:  Intake/Output Summary (Last 24 hours) at 02/19/17 0825 Last data filed at 02/19/17 0600  Gross per 24 hour  Intake           549.98 ml  Output             4000 ml  Net         -3450.02 ml   General appearance:  36 Year old chronically ill appearing female, well nourished, still minimally responsive but has small incremental improvements daily Eyes: sclerae icteric , moist conjunctivae; PERRL, EOMI bilaterally. Mouth:  membranes and no mucosal ulcerations; normal hard and soft palate, orally  intubated.  Neck: Trachea midline; neck supple, no JVD Lungs/chest: clear, with normal respiratory effort and no intercostal retractions CV: RRR, no MRGs  Abdomen: Soft, non-tender; no masses or HSM Extremities: chronic edema left foot dressing intact Skin: Normal temperature, turgor and texture; no rash, ulcers or subcutaneous nodules Neuro/Psych: still minimally responsive.  LABS:  BMET  Recent Labs Lab 02/17/17 0345 02/18/17 0300 02/19/17 0355  NA 134* 137 136  K 5.8* 4.8 3.4*  CL 95* 96* 96*  CO2 23 26 28   BUN 81* 43* 36*  CREATININE 5.37* 3.37* 3.19*  GLUCOSE 108* 98 132*    Electrolytes  Recent Labs Lab 02/17/17 0345 02/17/17 1739 02/18/17 0300 02/19/17 0355  CALCIUM 7.0*  --  7.5* 8.3*  MG 2.1 2.0 1.9  --   PHOS 9.5* 5.0* 6.1*  --     CBC  Recent Labs Lab 02/18/17 0300 02/18/17 0802 02/18/17 1134 02/19/17 0355  WBC 32.3* 30.3*  --  24.3*  HGB 6.8* 7.4*  --  7.2*  HCT 21.0* 23.0*  --  22.6*  PLT 135* 144* 132* 151    Coag's  Recent Labs Lab 02/14/17 1806 02/15/17 0530 02/18/17 1134  APTT 39*  --  42*  INR 1.46 1.52 1.38    Sepsis Markers  Recent Labs Lab 02/14/17 1141  02/14/17 1624 02/14/17 1807  LATICACIDVEN 1.1 3.4* 4.6*    ABG  Recent Labs Lab 02/15/17 0415 02/16/17 0355 02/17/17 0416  PHART 7.441 7.461* 7.352  PCO2ART 36.0 31.8* 42.6  PO2ART 402* 154* 148*    Liver Enzymes  Recent Labs Lab 02/15/17 0530 02/17/17 0345 02/17/17 1107 02/19/17 0355  AST 106* 32  --  15  ALT 69* 42  --  22  ALKPHOS 184* 172*  --  382*  BILITOT 5.6* 11.6* 12.0* 12.4*  ALBUMIN 2.1* 1.8*  --  1.5*    Cardiac Enzymes  Recent Labs Lab 02/14/17 2327 02/15/17 0530 02/15/17 1259  TROPONINI 0.23* 0.22* 0.16*    Glucose  Recent Labs Lab 02/18/17 1136 02/18/17 1629 02/18/17 1926 02/18/17 2335 02/19/17 0338 02/19/17 0744  GLUCAP 101* 115* 117* 129* 125* 135*   STUDIES:  Lower extremity ultrasound 02/13/17> no evidence  of DVT CT head 02/14/17> normal exam Chest x-ray 02/14/17> cardiomegaly, ETT, left IJ in place. I have reviewed all images personally Echo> EF 30-35%, increased LVF. Diffuse hypokinesis, akinesis of basilar mid-inferior myocardium, grade 2 diastolic dysfxn  EEG 0/34: finding c/w mod to severe global cerebral dysfxn. C/w anoxic injury given clinical course  LE [place] 5/21>>>neg MRI brain 5/24: 1. Focus of susceptibility in the left insula may indicate a thrombosed cortical vein. The location does not correspond to a large artery. 2. No associated ischemia or hemorrhage.  Otherwise normal brain.  CULTURES: Bcx 5/19 X 2 >   ANTIBIOTICS: Clinidamycin 5/19 >5/21 Vanco 5/19 > Zosyn 5/19 >5/22 Meropenem 5/22>>>  SIGNIFICANT EVENTS: 5/19 - Admit, cardiac arrest, transfer to ICU; hypothermia protocol started 5/24 - maybe a little more awake. Out of shock. MRI w/out evidence of ischemia  5/25:   LINES/TUBES: Oett 5/19>> CVL 5/19>>   ASSESSMENT / PLAN:  PULMONARY A: Acute respiratory failure  Ventilator dependence s/p cardiac arrest  Pulmonary edema pcxr (5/25) personally reviewed. Still w/ diffuse edema BUT, remarkably improved aeration when c/w film on prior day.  Excellent Vts on PSV 10 MS remains barrier to extubation  P Cont PSV as tolerated No extubation today  PAD goal ->0   CARDIOVASCULAR A:  Asystolic arrest Acute systolic CM w/ EF decline from 35-40% down to 30-35% Hypotension (likely sedation related at this point) Grade II DD Troponin elevation-->trended down  -off pressors again  P Cont KVO IVFs Cont tele  Holding coreg, isosorbide and lipitor   RENAL A:   ESRD Fluid and electrolyte d/o: hypokalemia  -got over 3 liters off yesterday; still +1.3 liters  P: Replace lytes Trend I&O HD per renal  GASTROINTESTINAL A:   Shock liver  Hyperbilirubinemia: ->abd [place] neg for stones of evidence of cholecystitis or obstruction; there is mention of hypoechoic lesion  on liver.  T-bili still climbing.  LDH nml DIC panel w/ no schistocytes Can't do MRI if can't hold breath. (per GI) Protein calorie malnutrition  P:   Will ask GI to see and assist w/ progressive jaundice  Cont tube feeds Cont SUP  HEMATOLOGIC A:   Anemia of critical illness.  -got 1 unit blood yesterday. DIC neg, no evidence of hemolysis. hgb holding P:  Trend cbc Transfuse per protocol   INFECTIOUS A:   Left foot Osteo Wbc trending down P: abx day #6 Meropenem day 3/vanc 6  ENDOCRINE A:   DM P: ssi   NEUROLOGIC A:   Acute Encephalopathy. Presumed hypoxic/ischemic +/- metabolic given -initial EEG-->diffuse slowing; CT head neg  ->MRI neg  P:   F/u EEG today Send ammonia level in light of hepatic dysfxn Cont supportive care.   DISCUSSION: Remains encephalopathic but MRI w/ out structural injury to suggest ischemic injury. Getting f/u EEG now. Has been off sedation for several days. She has a little more movement every day. Need to be sure no seizure. Main things for today: -check ammonia level-->if elevated start lactulose as this could be a significant factor in her not waking up -ask GI to see her in light of her rising alk PO4 and elevated bilirubin. Her [place] did not suggest obstruction. I also looked for evidence of hemolysis which was neg. Talked w/ radiology; they think MRI next test to eval liver lesion but need to be able to hold breath. Not sure where next diagnostic focus needs to be.   My ccm time 45 minutes Erick Colace ACNP-BC [place] Pager # [phone] OR # [phone] if no answer  02/19/2017 8:25 AM

## 2017-02-19 NOTE — Consult Note (Signed)
Reason for Consult: Jaundice Referring Physician: Hospital team  [name] is an 36 y.o. female.  HPI: Patient seen and examined and her hospital computer chart reviewed and her case discussed with her mother and another relative and she has no history of previous liver disease and no obvious previous GI complaints and her bilirubin has been increasing since this hospital stay and her alkaline phosphatase has to a lesser degree and no problems run in the family and she is currently intubated in the ICU and she's been tolerating her tube feeds  Past Medical History:  Diagnosis Date  . Anemia   . Arthritis    "left hand" (09/15/2013)  . Asthma   . CHF (congestive heart failure) ([place])   . Chronic bronchitis ([place])    "just about q yr" (09/15/2013)  . Chronic kidney disease    "low kidney function" (09/15/2013) ,  T[place]Th/Sa  . COPD (chronic obstructive pulmonary disease) ([place])   . Coronary artery disease   . Hyperlipidemia   . Hypertension   . Migraine    "get them alot" (09/15/2013)  . Myocardial infarction [facility]) 04/2015   NSTEMI  . Normal coronary arteries    by cardiac catheterization 09/20/13  . Peripheral vascular disease ([place])   . Pneumonia    "couple times; have it now" (09/15/2013)  . PONV (postoperative nausea and vomiting)   . Restless legs   . Shortness of breath    "just recently; related to the pneumonia" (09/15/2013)  . Type 1 diabetes mellitus ([place])    type 2    Past Surgical History:  Procedure Laterality Date  . AMPUTATION Right 06/25/2016   Procedure: Amputation Right Great Toe at the Metatarsophalangeal Joint;  Surgeon: Newt Minion, MD;  Location: [place];  Service: Orthopedics;  Laterality: Right;  . AMPUTATION Bilateral 10/08/2016   Procedure: Bilateral Transmetatarsal Amputation;  Surgeon: Newt Minion, MD;  Location: [place];  Service: Orthopedics;  Laterality: Bilateral;  . AV FISTULA PLACEMENT Left 03/27/2015   Procedure: CREATION RADIAL CEPHALIC  ARTERIOVENOUS FISTULA;  Surgeon: Angelia Mould, MD;  Location: [facility];  Service: Vascular;  Laterality: Left;  . AV FISTULA PLACEMENT Left 11/23/2015   Procedure:  LEFT ARM BASILIC VEIN TRANSPOSITION  ;  Surgeon: Angelia Mould, MD;  Location: [facility];  Service: Vascular;  Laterality: Left;  . CESAREAN SECTION  1999; 2006  . CORONARY ANGIOGRAM  09/20/2013   Procedure: CORONARY ANGIOGRAM;  Surgeon: Lorretta Harp, MD;  Location: [facility] CATH LAB;  Service: Cardiovascular;;  . FINGER SURGERY Left 1985   3rd and 4th digits reconstructed after cut off" (09/15/2013)  . PERIPHERAL VASCULAR CATHETERIZATION N/A 09/05/2016   Procedure: Abdominal Aortogram;  Surgeon: Elam Dutch, MD;  Location: [place] CV LAB;  Service: Cardiovascular;  Laterality: N/A;  . PERIPHERAL VASCULAR CATHETERIZATION Bilateral 09/05/2016   Procedure: Lower Extremity Angiography;  Surgeon: Elam Dutch, MD;  Location: [facility] CV LAB;  Service: Cardiovascular;  Laterality: Bilateral;  . PERIPHERAL VASCULAR CATHETERIZATION Right 09/05/2016   Procedure: Peripheral Vascular Balloon Angioplasty;  Surgeon: Elam Dutch, MD;  Location: [place] CV LAB;  Service: Cardiovascular;  Laterality: Right;  peroneal and AT  . SHOULDER ARTHROSCOPY WITH BICEPSTENOTOMY Right 05/10/2015   Procedure: RIGHT SHOULDER ARTHROSCOPY WITH BICEPS TENOTOMY, DEBRIDEMENT LABRAL TEAR;  Surgeon: Tania Ade, MD;  Location: [facility];  Service: Orthopedics;  Laterality: Right;  Right shoulder arthroscopy biceps tenotomy, debridement labral tear  . STUMP REVISION Left 01/21/2017  Procedure: . Revision Left Transmetatarsal Amputation;  Surgeon: Newt Minion, MD;  Location: [facility];  Service: Orthopedics;  Laterality: Left;  . TONSILLECTOMY  1997  . TUBAL LIGATION  2006    Family History  Problem Relation Age of Onset  . Diabetes Mother   . Stroke Mother   . Hypertension Father   . Hyperlipidemia Father   . Cancer - Lung Father   .  Diabetes Maternal Grandmother   . Cancer Paternal Grandmother     Social History:  reports that she has never smoked. She has never used smokeless tobacco. She reports that she does not drink alcohol or use drugs.  Allergies:  Allergies  Allergen Reactions  . Aspirin Anaphylaxis  . Sulfur Hives  . Tramadol Hives and Other (See Comments)    Pt states she feels weird   . Gabapentin Other (See Comments)    Lethargic     Medications: I have reviewed the patient's current medications.  Results for orders placed or performed during the hospital encounter of 02/14/17 (from the past 48 hour(s))  Glucose, capillary     Status: Abnormal   Collection Time: 02/17/17  4:33 PM  Result Value Ref Range   Glucose-Capillary 129 (H) 65 - 99 mg/dL   Comment 1 Arterial Specimen    Comment 2 Notify RN   Magnesium     Status: None   Collection Time: 02/17/17  5:39 PM  Result Value Ref Range   Magnesium 2.0 1.7 - 2.4 mg/dL  Phosphorus     Status: Abnormal   Collection Time: 02/17/17  5:39 PM  Result Value Ref Range   Phosphorus 5.0 (H) 2.5 - 4.6 mg/dL  Glucose, capillary     Status: Abnormal   Collection Time: 02/17/17  7:23 PM  Result Value Ref Range   Glucose-Capillary 103 (H) 65 - 99 mg/dL   Comment 1 Capillary Specimen    Comment 2 Notify RN    Comment 3 Document in Chart   Glucose, capillary     Status: Abnormal   Collection Time: 02/17/17 11:32 PM  Result Value Ref Range   Glucose-Capillary 101 (H) 65 - 99 mg/dL   Comment 1 Capillary Specimen    Comment 2 Notify RN    Comment 3 Document in Chart   Basic metabolic panel     Status: Abnormal   Collection Time: 02/18/17  3:00 AM  Result Value Ref Range   Sodium 137 135 - 145 mmol/L   Potassium 4.8 3.5 - 5.1 mmol/L    Comment: DELTA CHECK NOTED   Chloride 96 (L) 101 - 111 mmol/L   CO2 26 22 - 32 mmol/L   Glucose, Bld 98 65 - 99 mg/dL   BUN 43 (H) 6 - 20 mg/dL   Creatinine, Ser 3.37 (H) 0.44 - 1.00 mg/dL    Comment: DELTA CHECK  NOTED   Calcium 7.5 (L) 8.9 - 10.3 mg/dL   GFR calc non Af Amer 16 (L) >60 mL/min   GFR calc Af Amer 19 (L) >60 mL/min    Comment: (NOTE) The eGFR has been calculated using the CKD EPI equation. This calculation has not been validated in all clinical situations. eGFR's persistently <60 mL/min signify possible Chronic Kidney Disease.    Anion gap 15 5 - 15  CBC     Status: Abnormal   Collection Time: 02/18/17  3:00 AM  Result Value Ref Range   WBC 32.3 (H) 4.0 - 10.5 K/uL   RBC 2.27 (  L) 3.87 - 5.11 MIL/uL   Hemoglobin 6.8 (LL) 12.0 - 15.0 g/dL    Comment: REPEATED TO VERIFY CRITICAL RESULT CALLED TO, READ BACK BY AND VERIFIED WITH: B.WOODSON,RN 0330 02/18/17 G.MCADOO    HCT 21.0 (L) 36.0 - 46.0 %   MCV 92.5 78.0 - 100.0 fL   MCH 30.0 26.0 - 34.0 pg   MCHC 32.4 30.0 - 36.0 g/dL   RDW 17.1 (H) 11.5 - 15.5 %   Platelets 135 (L) 150 - 400 K/uL  Magnesium     Status: None   Collection Time: 02/18/17  3:00 AM  Result Value Ref Range   Magnesium 1.9 1.7 - 2.4 mg/dL  Phosphorus     Status: Abnormal   Collection Time: 02/18/17  3:00 AM  Result Value Ref Range   Phosphorus 6.1 (H) 2.5 - 4.6 mg/dL  Prepare RBC     Status: None   Collection Time: 02/18/17  3:38 AM  Result Value Ref Range   Order Confirmation ORDER PROCESSED BY BLOOD BANK   Glucose, capillary     Status: Abnormal   Collection Time: 02/18/17  3:47 AM  Result Value Ref Range   Glucose-Capillary 100 (H) 65 - 99 mg/dL   Comment 1 Capillary Specimen    Comment 2 Notify RN    Comment 3 Document in Chart   Type and screen [place]     Status: None   Collection Time: 02/18/17  4:14 AM  Result Value Ref Range   ABO/RH(D) O POS    Antibody Screen NEG    Sample Expiration 02/21/2017    Unit Number [id number]    Blood Component Type RBC LR PHER2    Unit division 00    Status of Unit ISSUED,FINAL    Transfusion Status OK TO TRANSFUSE    Crossmatch Result Compatible   ABO/Rh     Status: None    Collection Time: 02/18/17  4:14 AM  Result Value Ref Range   ABO/RH(D) O POS   Glucose, capillary     Status: Abnormal   Collection Time: 02/18/17  7:59 AM  Result Value Ref Range   Glucose-Capillary 108 (H) 65 - 99 mg/dL   Comment 1 Notify RN   CBC     Status: Abnormal   Collection Time: 02/18/17  8:02 AM  Result Value Ref Range   WBC 30.3 (H) 4.0 - 10.5 K/uL   RBC 2.50 (L) 3.87 - 5.11 MIL/uL   Hemoglobin 7.4 (L) 12.0 - 15.0 g/dL   HCT 23.0 (L) 36.0 - 46.0 %   MCV 92.0 78.0 - 100.0 fL   MCH 29.6 26.0 - 34.0 pg   MCHC 32.2 30.0 - 36.0 g/dL   RDW 16.7 (H) 11.5 - 15.5 %   Platelets 144 (L) 150 - 400 K/uL  Save smear     Status: None   Collection Time: 02/18/17 10:51 AM  Result Value Ref Range   Smear Review SMEAR STAINED AND AVAILABLE FOR REVIEW   Folate     Status: None   Collection Time: 02/18/17 10:51 AM  Result Value Ref Range   Folate 12.8 >5.9 ng/mL  Ferritin     Status: Abnormal   Collection Time: 02/18/17 10:51 AM  Result Value Ref Range   Ferritin 466 (H) 11 - 307 ng/mL  Reticulocytes     Status: Abnormal   Collection Time: 02/18/17 10:51 AM  Result Value Ref Range   Retic Ct Pct 1.9 0.4 - 3.1 %  RBC. 2.48 (L) 3.87 - 5.11 MIL/uL   Retic Count, Manual 47.1 19.0 - 186.0 K/uL  Lactate dehydrogenase     Status: None   Collection Time: 02/18/17 10:51 AM  Result Value Ref Range   LDH 159 98 - 192 U/L  Vitamin B12     Status: Abnormal   Collection Time: 02/18/17 10:51 AM  Result Value Ref Range   Vitamin B-12 2,988 (H) 180 - 914 pg/mL    Comment: (NOTE) This assay is not validated for testing neonatal or myeloproliferative syndrome specimens for Vitamin B12 levels.   Iron and TIBC     Status: Abnormal   Collection Time: 02/18/17 10:51 AM  Result Value Ref Range   Iron 17 (L) 28 - 170 ug/dL   TIBC 102 (L) 250 - 450 ug/dL   Saturation Ratios 17 10.4 - 31.8 %   UIBC 85 ug/dL  DIC (disseminated intravasc coag) panel     Status: Abnormal   Collection Time:  02/18/17 11:34 AM  Result Value Ref Range   Prothrombin Time 17.0 (H) 11.4 - 15.2 seconds   INR 1.38    aPTT 42 (H) 24 - 36 seconds    Comment:        IF BASELINE aPTT IS ELEVATED, SUGGEST PATIENT RISK ASSESSMENT BE USED TO DETERMINE APPROPRIATE ANTICOAGULANT THERAPY.    Fibrinogen >800 (H) 210 - 475 mg/dL   D-Dimer, Quant 3.11 (H) 0.00 - 0.50 ug/mL-FEU    Comment: (NOTE) At the manufacturer cut-off of 0.50 ug/mL FEU, this assay has been documented to exclude PE with a sensitivity and negative predictive value of 97 to 99%.  At this time, this assay has not been approved by the FDA to exclude DVT/VTE. Results should be correlated with clinical presentation.    Platelets 132 (L) 150 - 400 K/uL   Smear Review NO SCHISTOCYTES SEEN   Glucose, capillary     Status: Abnormal   Collection Time: 02/18/17 11:36 AM  Result Value Ref Range   Glucose-Capillary 101 (H) 65 - 99 mg/dL   Comment 1 Notify RN   Glucose, capillary     Status: Abnormal   Collection Time: 02/18/17  4:29 PM  Result Value Ref Range   Glucose-Capillary 115 (H) 65 - 99 mg/dL   Comment 1 Notify RN   Glucose, capillary     Status: Abnormal   Collection Time: 02/18/17  7:26 PM  Result Value Ref Range   Glucose-Capillary 117 (H) 65 - 99 mg/dL   Comment 1 Capillary Specimen    Comment 2 Notify RN    Comment 3 Document in Chart   Glucose, capillary     Status: Abnormal   Collection Time: 02/18/17 11:35 PM  Result Value Ref Range   Glucose-Capillary 129 (H) 65 - 99 mg/dL   Comment 1 Capillary Specimen    Comment 2 Notify RN    Comment 3 Document in Chart   Glucose, capillary     Status: Abnormal   Collection Time: 02/19/17  3:38 AM  Result Value Ref Range   Glucose-Capillary 125 (H) 65 - 99 mg/dL   Comment 1 Capillary Specimen    Comment 2 Notify RN    Comment 3 Document in Chart   CBC     Status: Abnormal   Collection Time: 02/19/17  3:55 AM  Result Value Ref Range   WBC 24.3 (H) 4.0 - 10.5 K/uL   RBC 2.45  (L) 3.87 - 5.11 MIL/uL   Hemoglobin 7.2 (L)  12.0 - 15.0 g/dL   HCT 22.6 (L) 36.0 - 46.0 %   MCV 92.2 78.0 - 100.0 fL   MCH 29.4 26.0 - 34.0 pg   MCHC 31.9 30.0 - 36.0 g/dL   RDW 16.9 (H) 11.5 - 15.5 %   Platelets 151 150 - 400 K/uL  Comprehensive metabolic panel     Status: Abnormal   Collection Time: 02/19/17  3:55 AM  Result Value Ref Range   Sodium 136 135 - 145 mmol/L   Potassium 3.4 (L) 3.5 - 5.1 mmol/L    Comment: DELTA CHECK NOTED   Chloride 96 (L) 101 - 111 mmol/L   CO2 28 22 - 32 mmol/L   Glucose, Bld 132 (H) 65 - 99 mg/dL   BUN 36 (H) 6 - 20 mg/dL   Creatinine, Ser 3.19 (H) 0.44 - 1.00 mg/dL   Calcium 8.3 (L) 8.9 - 10.3 mg/dL   Total Protein 5.0 (L) 6.5 - 8.1 g/dL   Albumin 1.5 (L) 3.5 - 5.0 g/dL   AST 15 15 - 41 U/L   ALT 22 14 - 54 U/L   Alkaline Phosphatase 382 (H) 38 - 126 U/L   Total Bilirubin 12.4 (H) 0.3 - 1.2 mg/dL   GFR calc non Af Amer 17 (L) >60 mL/min   GFR calc Af Amer 20 (L) >60 mL/min    Comment: (NOTE) The eGFR has been calculated using the CKD EPI equation. This calculation has not been validated in all clinical situations. eGFR's persistently <60 mL/min signify possible Chronic Kidney Disease.    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 IV contrast could not be administered due to the patient's poor renal function. COMPARISON:  Head CT 02/14/2017 FINDINGS: Brain: The midline structures are normal. There is no focal diffusion restriction to indicate acute infarct. The brain parenchymal signal is normal and there is no mass lesion. There is a focal susceptibility abnormality at the left insula (series 12, image 55). Brain volume is normal for age without age-advanced or lobar predominant atrophy. The dura is normal and there is no extra-axial collection. Vascular: Major intracranial arterial and venous sinus flow voids are preserved. Skull and upper cervical spine: The visualized skull base, calvarium, upper cervical spine and extracranial soft tissues are normal. Sinuses/Orbits: No fluid levels or advanced mucosal thickening. No mastoid effusion. Normal orbits. IMPRESSION: 1. Focus of susceptibility in the left insula may indicate a thrombosed cortical vein. The location does not correspond to a large artery. 2. No associated ischemia or hemorrhage.  Otherwise normal brain. Electronically Signed   By: Ulyses Jarred M.D.   On: 02/18/2017 14:55   Dg Chest [facility]  1 View  Result Date: 02/19/2017 CLINICAL DATA:  36 year old female with respiratory failure. EXAM: PORTABLE CHEST 1 VIEW COMPARISON:  02/18/2017 and prior radiograph FINDINGS: An endotracheal tube with tip 3.5 cm above the carina, left IJ central venous catheter with tip overlying the lower SVC, and NG tube entering the stomach with tip off the field of view again noted. Pulmonary edema has decreased from the prior study. Continued left lower lung atelectasis/ consolidation again noted. There is no evidence of pneumothorax. No other changes are noted. IMPRESSION: Decreased pulmonary edema, otherwise unchanged appearance of the chest. Electronically Signed   By: Margarette Canada M.D.   On: 02/19/2017 08:03   Dg Chest Port 1 View  Result Date: 02/18/2017 CLINICAL DATA:  Respiratory  failure EXAM: PORTABLE CHEST 1 VIEW COMPARISON:  02/17/2017 FINDINGS: Cardiac shadow remains enlarged. Endotracheal tube, nasogastric catheter and esophageal probe are again seen and stable. Left jugular central line is again noted and stable. No pneumothorax is seen. Diffuse vascular congestion and increased parenchymal opacity is noted consistent with progressive CHF. No focal confluent infiltrate is noted. IMPRESSION: Progressive changes of CHF. Electronically Signed   By: Inez Catalina M.D.   On: 02/18/2017 08:15   US Abdomen Limited Ruq  Result Date: 02/18/2017 CLINICAL DATA:  Elevated liver function studies. EXAM: US ABDOMEN LIMITED - RIGHT UPPER QUADRANT COMPARISON:  None. FINDINGS: Gallbladder: No gallstones or wall thickening visualized. No sonographic Murphy sign noted by sonographer. Common bile duct: Diameter: 7 mm, normal Liver: Circumscribed hyperechoic lesion with central hypoechoic appearance demonstrated along the dome of the liver measuring 2.8 cm maximal diameter. This is likely to represent a cavernous hemangioma, but the appearance on ultrasound is somewhat atypical. Consider follow-up with elective MRI of the liver for better characterization. IMPRESSION: No evidence of cholelithiasis or cholecystitis. Circumscribed hyperechoic lesion in the dome of the liver measuring 2.8 cm maximal diameter. This is probably a cavernous hemangioma but appearance is somewhat atypical. Suggest elective MRI liver in follow-up for better characterization. Electronically Signed   By: Lucienne Capers M.D.   On: 02/18/2017 02:10    ROS unable to obtain Blood pressure 119/66, pulse (!) 105, temperature 99.5 F (37.5 C), temperature source Oral, resp. rate (!) 27, height 5' 8.5" (1.74 m), weight 119.6 kg (263 lb 10.7 oz), SpO2 99 %. Physical Exam vital signs as above seemingly comfortable on the ventilator although not responsive abdomen is soft occasional bowel sounds no obvious tenderness labs reviewed  ultrasound reviewed and above  Assessment/Plan: Jaundice probably multifactorial but probably related to liver congestion and hypotension and sepsis although medicines like her antibiotics could be playing a role Plan: Ask pharmacy or  ID which medicines or antibiotics could be playing a role and consider changing them otherwise continue medical management and will ask  my partner this weekend to look in on patient and please call him sooner if needed  [facility] E 02/19/2017, 12:55 PM

## 2017-02-19 NOTE — Procedures (Signed)
ELECTROENCEPHALOGRAM REPORT  Date of Study: 02/19/2017  Patient's Name: [name] MRN: [id number] Date of Birth: August 03, 1981  Referring Provider: Erick Colace, NP  Clinical History: 36 year old woman rewarmed following hypothermia protocol for cardiac arrest.  She is currently not on sedation.  Medications: albuterol (PROVENTIL) (2.5 MG/3ML) 0.083% nebulizer solution 2.5 mg  budesonide (PULMICORT) nebulizer solution 0.25 mgcalcium carbonate (dosed in mg elemental calcium) suspension 2,000 mg of elemental calcium  chlorhexidine gluconate (MEDLINE KIT) (PERIDEX) 0.12 % solution 15 mL  Chlorhexidine Gluconate Cloth 2 % PADS 6 each  Darbepoetin Alfa (ARANESP) injection 100 mcg  famotidine (PEPCID) IVPB 20 mg premix  fentaNYL (SUBLIMAZE) injection 12.5-25 mcg  heparin injection 5,000 Units  inmeropenem (MERREM) 500 mg in sodium chloride 0.9 % 50 mL IVPB  midazolam (VERSED) injection 1-2 mg  norepinephrine (LEVOPHED) 16 mg in dextrose 5 % 250 mL (0.064 mg/mL) infusion  vancomycin (VANCOCIN) IVPB 1000 mg/200 mL premix  Technical Summary: A multichannel digital EEG recording measured by the international 10-20 system with electrodes applied with paste and impedances below 5000 ohms performed as portable with EKG monitoring in an intubated and poorly responsive patient off of sedation.  Hyperventilation and photic stimulation were not performed.  The digital EEG was referentially recorded, reformatted, and digitally filtered in a variety of bipolar and referential montages for optimal display.   Description: The patient is poorly responsive during the recording.  There is no posterior dominant rhythm.  There is diffuse 4-5 Hz theta and 2-3 Hz delta slowing of the waking background.  She is poorly responsive to noxious stimuli.  Stage 2 sleep is not seen.  There were no epileptiform discharges or electrographic seizures seen.    EKG lead was unremarkable.  Impression: This EEG is abnormal  due to diffuse slowing of the waking background.  Clinical Correlation of the above findings indicates diffuse cerebral dysfunction that is non-specific in etiology and can be seen with hypoxic/ischemic injury, toxic/metabolic encephalopathies, neurodegenerative disorders, or medication effect.  Clinical correlation is advised.  Metta Clines, DO

## 2017-02-19 NOTE — Progress Notes (Signed)
eLink Physician-Brief Progress Note Patient Name: [name] DOB: [birth year] MRN: [id number]   Date of Service  02/19/2017  HPI/Events of Note  Sent sharepoint d/t K+ =3.4 and Creatinine = 3.19. Hx of ESRD.  eICU Interventions  Will NOT replace K+ at this time      Intervention Category Major Interventions: Electrolyte abnormality - evaluation and management  Gevorg Brum Dennard Nip 02/19/2017, 5:44 AM

## 2017-02-19 NOTE — Progress Notes (Signed)
[facility] Progress Note   Subjective:  Having EEG presently Has been doing more spontaneous movement Had HD yesterday with norepi support for BP and able to remove net of 4 liters of fluid with weight down to 119.6 kg from 123.6 kg  Vitals:   02/19/17 0600 02/19/17 0700 02/19/17 0800 02/19/17 0831  BP: 124/68 113/68 (!) 103/52   Pulse: 100 (!) 102 85   Resp: 19 (!) 21 20   Temp:  99.5 F (37.5 C)    TempSrc:  Oral    SpO2: 99% 100% 100% 100%  Weight:      Height:       Exam: (Sedation off since 8AM 5/21) L IJ CVL Orally intubated/OG tube  No jvd Lungs coarse BS Regular rhythm  L foot bandaged R foot metatarsal amp Chronic woody changes both LE's with pitting thighs and calves LUA AVF +bruit  Inpatient medications: . calcium carbonate (dosed in mg elemental calcium)  2,000 mg of elemental calcium Oral TID  . chlorhexidine gluconate (MEDLINE KIT)  15 mL Mouth Rinse BID  . Chlorhexidine Gluconate Cloth  6 each Topical Q0600  . darbepoetin (ARANESP) injection - DIALYSIS  100 mcg Intravenous Q Tue-HD  . feeding supplement (NEPRO CARB STEADY)  1,000 mL Per Tube Q24H  . feeding supplement (PRO-STAT SUGAR FREE 64)  60 mL Per Tube QID  . heparin  5,000 Units Subcutaneous Q8H  . insulin aspart  0-9 Units Subcutaneous Q4H  . mouth rinse  15 mL Mouth Rinse 10 times per day  . sodium chloride flush  10-40 mL Intracatheter Q12H   . sodium chloride 10 mL/hr at 02/19/17 0800  . sodium chloride 10 mL/hr at 02/17/17 1900  . famotidine (PEPCID) IV Stopped (02/18/17 2233)  . meropenem (MERREM) IV Stopped (02/18/17 2019)  . norepinephrine (LEVOPHED) Adult infusion Stopped (02/18/17 2045)  . vancomycin Stopped (02/17/17 1650)   albuterol, budesonide, fentaNYL (SUBLIMAZE) injection, midazolam, sodium chloride flush  CXR - no active disease 5/19 am CXR 5/21 Stable CM ECHO 5/20 30-35%, Grade 2  CXR 5/22 mild vasc congestion CXR 5/23 worsening vascular  congestion CXR 5/24 decreased pulmonary edema MRI 02/18/17 Focus of susceptibility in the left insula may indicate a thrombosed cortical vein. The location does not correspond to a large artery. 2. No associated ischemia or hemorrhage. Otherwise normal brain   Recent Labs Lab 02/17/17 0345 02/17/17 1739 02/18/17 0300 02/19/17 0355  NA 134*  --  137 136  K 5.8*  --  4.8 3.4*  CL 95*  --  96* 96*  CO2 23  --  26 28  GLUCOSE 108*  --  98 132*  BUN 81*  --  43* 36*  CREATININE 5.37*  --  3.37* 3.19*  CALCIUM 7.0*  --  7.5* 8.3*  PHOS 9.5* 5.0* 6.1*  --     Recent Labs Lab 02/15/17 0530 02/17/17 0345 02/17/17 1107 02/19/17 0355  AST 106* 32  --  15  ALT 69* 42  --  22  ALKPHOS 184* 172*  --  382*  BILITOT 5.6* 11.6* 12.0* 12.4*  PROT 5.3* 5.3*  --  5.0*  ALBUMIN 2.1* 1.8*  --  1.5*    Recent Labs Lab 02/14/17 0526  02/18/17 0300 02/18/17 0802 02/18/17 1134 02/19/17 0355  WBC 18.9*  < > 32.3* 30.3*  --  24.3*  NEUTROABS 17.2*  --   --   --   --   --   HGB 10.0*  < >  6.8* 7.4*  --  7.2*  HCT 30.8*  < > 21.0* 23.0*  --  22.6*  MCV 92.5  < > 92.5 92.0  --  92.2  PLT 167  < > 135* 144* 132* 151  < > = values in this interval not displayed.  Dialysis: TTS [facility] 4h 54mn    114kg    2/2 bath  LUA AVF - venofer 50/wk - mircera 75 every 2 wks, last 5/10 - calcitriol 0.75 ug tiw  Summary: 36year old female w/ ESRD d/t poorly controlled DM, HD bilateral TMA's, obesity, HTN, medical non-compliance, NICM. Admitted 5/19 w/left leg pain, + radiological evidence of osteomyelitis w/wound nonhealing, dehiscence, purulent drainage. Admitted for IV antibiotics, hemodialysis and possible surgery. Found unresponsive in the room on 5/19. Rec'd chest compressions, epi bicarb; intubate; cooled. We are providing HD.    Assessment: 1. Cardiac arrest (5/19, asystole). Small decline EF (now 30-35%) w/Gr 2 DD. Not sure etiology. S/p cooling protocol. Unclear what degree of anoxic brain  injury. Possibly some improvement. MRI not impressive. EEG today.  2. L foot osteo - s/p revision L TMA 4/25, dehisced. on IV Abx (V/Z). No imminent surgical plans 3. ESRD TTS HD. HD 5/22. Low BP's were prohibitive of fluid removal. HD 5/23 with net 4 liters off using levophed for BP support and CXR improved today.   4. Acute resp failure; on vent. Sedation off now. Some improvement in responsiveness 5. NICM EF 35-40% 6. Shock liver - persistent elevation bili (now up to almost 12). Ammonia level pending 7. DM2 8. COPD 9. CKD-MBD - changed Renvela to CaCO3 susp down tube and holding off on change in rocaltrol to IV for now 10. Anemia - Last ESA was Mircera 75 mcg 5/10. Started Darbe 100 given 5/22. S/p PRBC's X 1 for Hb 6.8 on 5/23. Will transfuse again tomorrow on HD.  Disposition -   CJamal Maes MD [facility] [phone] 02/19/2017, 9:31 AM

## 2017-02-19 NOTE — Progress Notes (Signed)
Nutrition Follow-up  DOCUMENTATION CODES:   Morbid obesity  INTERVENTION:   Continue:  Nepro at goal rate of 20 ml/h (480 ml per day) and Prostat 60 ml QID   TF regimen to provide 1664 kcals, 159 gm protein, 349 ml free water daily  NUTRITION DIAGNOSIS:   Inadequate oral intake related to inability to eat as evidenced by NPO status Ongoing.   GOAL:   Provide needs based on ASPEN/SCCM guidelines Met.   MONITOR:   Vent status, TF tolerance, Labs, Weight trends, Skin, I & O's  ASSESSMENT:   36 yo Female w/ ESRD d/t poorly controlled DM, bilateral transmetatarsal foot amputations, obesity, HTN, medical non-compliance as well as NICM. Admitted with complaints of left leg pain, found to have radiological evidence of osteomyelitis with wound nonhealing, dehiscence, purulent drainage from the wound. Admitted for IV antibiotics, hemodialysis and possible surgery.  5/19 Cardiac arrest  Patient is currently intubated on ventilator support Temp (24hrs), Avg:99.4 F (37.4 C), Min:98.7 F (37.1 C), Max:100.3 F (37.9 C)  OGT in place Medications reviewed.  Labs reviewed.  Phosphorus 6.1 (H). CBG's 135-150. HD removed 4 L 5/23  Diet Order:  Diet NPO time specified  Skin:     Left heel with unstageable pressure injury Left anterior heel with scattered areas of deep tissue injury  Last BM:  5/24  Height:   Ht Readings from Last 1 Encounters:  02/14/17 5' 8.5" (1.74 m)   Weight:   Wt Readings from Last 1 Encounters:  02/19/17 263 lb 10.7 oz (119.6 kg)   Ideal Body Weight:  63.6 kg  BMI:  Body mass index is 39.51 kg/m.  Estimated Nutritional Needs:   Kcal:  1375-1750  Protein:  >/= 159 gm  Fluid:  per MD  EDUCATION NEEDS:   No education needs identified at this time  Arthur Holms, RD, LDN Pager #: [phone] After-Hours Pager #: [phone]

## 2017-02-20 DIAGNOSIS — R748 Abnormal levels of other serum enzymes: Secondary | ICD-10-CM | POA: Diagnosis present

## 2017-02-20 DIAGNOSIS — R17 Unspecified jaundice: Secondary | ICD-10-CM

## 2017-02-20 LAB — COMPREHENSIVE METABOLIC PANEL
ALBUMIN: 1.5 g/dL — AB (ref 3.5–5.0)
ALT: 20 U/L (ref 14–54)
AST: 17 U/L (ref 15–41)
Alkaline Phosphatase: 459 U/L — ABNORMAL HIGH (ref 38–126)
Anion gap: 10 (ref 5–15)
BUN: 60 mg/dL — AB (ref 6–20)
CHLORIDE: 96 mmol/L — AB (ref 101–111)
CO2: 29 mmol/L (ref 22–32)
CREATININE: 4.23 mg/dL — AB (ref 0.44–1.00)
Calcium: 8.8 mg/dL — ABNORMAL LOW (ref 8.9–10.3)
GFR calc Af Amer: 14 mL/min — ABNORMAL LOW (ref >60)
GFR calc non Af Amer: 12 mL/min — ABNORMAL LOW (ref >60)
GLUCOSE: 189 mg/dL — AB (ref 65–99)
POTASSIUM: 3.4 mmol/L — AB (ref 3.5–5.1)
SODIUM: 135 mmol/L (ref 135–145)
Total Bilirubin: 10.8 mg/dL — ABNORMAL HIGH (ref 0.3–1.2)
Total Protein: 5.1 g/dL — ABNORMAL LOW (ref 6.5–8.1)

## 2017-02-20 LAB — GLUCOSE, CAPILLARY
GLUCOSE-CAPILLARY: 181 mg/dL — AB (ref 65–99)
GLUCOSE-CAPILLARY: 139 mg/dL — AB (ref 65–99)
GLUCOSE-CAPILLARY: 148 mg/dL — AB (ref 65–99)
GLUCOSE-CAPILLARY: 162 mg/dL — AB (ref 65–99)
Glucose-Capillary: 164 mg/dL — ABNORMAL HIGH (ref 65–99)

## 2017-02-20 LAB — CBC
HCT: 22.6 % — ABNORMAL LOW (ref 36.0–46.0)
Hemoglobin: 7.2 g/dL — ABNORMAL LOW (ref 12.0–15.0)
MCH: 29.6 pg (ref 26.0–34.0)
MCHC: 31.9 g/dL (ref 30.0–36.0)
MCV: 93 fL (ref 78.0–100.0)
PLATELETS: 169 10*3/uL (ref 150–400)
RBC: 2.43 MIL/uL — AB (ref 3.87–5.11)
RDW: 16.8 % — ABNORMAL HIGH (ref 11.5–15.5)
WBC: 20.5 10*3/uL — AB (ref 4.0–10.5)

## 2017-02-20 LAB — VANCOMYCIN, RANDOM: VANCOMYCIN RM: 23

## 2017-02-20 LAB — PREPARE RBC (CROSSMATCH)

## 2017-02-20 MED ORDER — GERHARDT'S BUTT CREAM
TOPICAL_CREAM | Freq: Two times a day (BID) | CUTANEOUS | Status: DC
  Administered 2017-02-20 (×2): via TOPICAL
  Administered 2017-02-21: 1 via TOPICAL
  Administered 2017-02-21: 23:00:00 via TOPICAL
  Administered 2017-02-22: 1 via TOPICAL
  Administered 2017-02-22: 22:00:00 via TOPICAL
  Administered 2017-02-23: 1 via TOPICAL
  Administered 2017-02-24: 09:00:00 via TOPICAL
  Administered 2017-02-24: 1 via TOPICAL
  Administered 2017-02-24: via TOPICAL
  Administered 2017-02-25 (×2): 1 via TOPICAL
  Administered 2017-02-26 – 2017-03-01 (×7): via TOPICAL
  Administered 2017-03-02: 1 via TOPICAL
  Administered 2017-03-02 – 2017-03-03 (×2): via TOPICAL
  Administered 2017-03-03 – 2017-03-06 (×6): 1 via TOPICAL
  Administered 2017-03-06: 15:00:00 via TOPICAL
  Administered 2017-03-07: 1 via TOPICAL
  Administered 2017-03-07 – 2017-03-08 (×3): via TOPICAL
  Administered 2017-03-09: 1 via TOPICAL
  Filled 2017-02-20 (×6): qty 1

## 2017-02-20 MED ORDER — SODIUM CHLORIDE 0.9 % IV SOLN: Freq: Once | INTRAVENOUS | Status: DC

## 2017-02-20 MED ORDER — FENTANYL CITRATE (PF) 100 MCG/2ML IJ SOLN
25.0000 ug | INTRAMUSCULAR | Status: DC | PRN
  Administered 2017-02-20 – 2017-02-21 (×2): 100 ug via INTRAVENOUS
  Administered 2017-02-23 – 2017-02-25 (×2): 50 ug via INTRAVENOUS
  Administered 2017-03-02: 100 ug via INTRAVENOUS
  Administered 2017-03-09 (×2): 50 ug via INTRAVENOUS
  Administered 2017-03-20: 100 ug via INTRAVENOUS
  Filled 2017-02-20 (×9): qty 2

## 2017-02-20 NOTE — Progress Notes (Signed)
[facility] Progress Note   HD NOTE:  I HAVE PERSONALLY SUPERVISED PT'S HD TREATMENT UF GOAL 4 LITERS W/LEVOPHED SUPPORT AS  NEEDED (have not had to start yet) L AVF 400 PRE WEIGHT 122.6 KG (edw 114 KG) FOR 2 UNITS OF BLOOD WITH HD 4k BATH  Jamal Maes, MD [facility] [phone] Pager 02/20/2017, 9:12 AM  Subjective:  Remains off sedation Mom says moving around more but not following commands  Vitals:   02/20/17 0745 02/20/17 0800 02/20/17 0807 02/20/17 0815  BP: 121/66 122/69 121/67 116/62  Pulse: (!) 104 (!) 105 (!) 105 (!) 101  Resp: 16 (!) 21    Temp: 100.3 F (37.9 C)     TempSrc: Oral     SpO2: 100% 98%    Weight: 122.6 kg (270 lb 4.5 oz)     Height:       Exam: (Sedation off since 8AM 5/21) L IJ CVL Orally intubated/OG tube  Scleral icterus No jvd Lungs coarse BS Regular rhythm tachy 110  L foot bandaged R foot metatarsal amp Chronic woody changes both LE's with pitting thighs and calves unchanged Withdraws and grimaces when LLE examined LUA AVF +bruit  Inpatient medications: . calcium carbonate (dosed in mg elemental calcium)  2,000 mg of elemental calcium Oral TID  . chlorhexidine gluconate (MEDLINE KIT)  15 mL Mouth Rinse BID  . Chlorhexidine Gluconate Cloth  6 each Topical Q0600  . darbepoetin (ARANESP) injection - DIALYSIS  100 mcg Intravenous Q Tue-HD  . feeding supplement (NEPRO CARB STEADY)  1,000 mL Per Tube Q24H  . feeding supplement (PRO-STAT SUGAR FREE 64)  60 mL Per Tube QID  . heparin  5,000 Units Subcutaneous Q8H  . insulin aspart  0-9 Units Subcutaneous Q4H  . mouth rinse  15 mL Mouth Rinse 10 times per day  . sodium chloride flush  10-40 mL Intracatheter Q12H   . sodium chloride 10 mL/hr at 02/20/17 0800  . sodium chloride 10 mL/hr at 02/17/17 1900  . sodium chloride    . famotidine (PEPCID) IV Stopped (02/19/17 2217)  . meropenem (MERREM) IV 500 mg (02/19/17 1828)  . norepinephrine (LEVOPHED) Adult  infusion Stopped (02/18/17 2045)  . vancomycin     albuterol, budesonide, fentaNYL (SUBLIMAZE) injection, midazolam, sodium chloride flush  CXR - no active disease 5/19 am CXR 5/21 Stable CM ECHO 5/20 30-35%, Grade 2  CXR 5/22 mild vasc congestion CXR 5/23 worsening vascular congestion CXR 5/24 decreased pulmonary edema MRI 02/18/17 Focus of susceptibility in the left insula may indicate a thrombosed cortical vein. The location does not correspond to a large artery. 2. No associated ischemia or hemorrhage. Otherwise normal brain   Recent Labs Lab 02/17/17 0345 02/17/17 1739 02/18/17 0300 02/19/17 0355 02/20/17 0415  NA 134*  --  137 136 135  K 5.8*  --  4.8 3.4* 3.4*  CL 95*  --  96* 96* 96*  CO2 23  --  _0 GLUCOSE 108*  --  98 132* 189*  BUN 81*  --  43* 36* 60*  CREATININE 5.37*  --  3.37* 3.19* 4.23*  CALCIUM 7.0*  --  7.5* 8.3* 8.8*  PHOS 9.5* 5.0* 6.1*  --   --     Recent Labs Lab 02/17/17 0345 02/17/17 1107 02/19/17 0355 02/20/17 0415  AST 32  --  15 17  ALT 42  --  22 20  ALKPHOS 172*  --  382* 459*  BILITOT 11.6* 12.0*  12.4* 10.8*  PROT 5.3*  --  5.0* 5.1*  ALBUMIN 1.8*  --  1.5* 1.5*    Recent Labs Lab 02/14/17 0526  02/18/17 0802 02/18/17 1134 02/19/17 0355 02/20/17 0415  WBC 18.9*  < > 30.3*  --  24.3* 20.5*  NEUTROABS 17.2*  --   --   --   --   --   HGB 10.0*  < > 7.4*  --  7.2* 7.2*  HCT 30.8*  < > 23.0*  --  22.6* 22.6*  MCV 92.5  < > 92.0  --  92.2 93.0  PLT 167  < > 144* 132* 151 169  < > = values in this interval not displayed.  Dialysis: TTS [facility] 4h 16mn    114kg    2/2 bath  LUA AVF - venofer 50/wk - mircera 75 every 2 wks, last 5/10 - calcitriol 0.75 ug tiw  Summary: 36year old female w/ ESRD d/t poorly controlled DM, HD bilateral TMA's, obesity, HTN, medical non-compliance, NICM. Admitted 5/19 w/left leg pain, + radiological evidence of osteomyelitis w/wound nonhealing, dehiscence, purulent drainage. Admitted for IV  antibiotics, hemodialysis and possible surgery. Found unresponsive in the room on 5/19. Rec'd chest compressions, epi bicarb; intubate; cooled. We are providing HD.    Assessment: 1. Cardiac arrest (5/19, asystole). Small decline EF (now 30-35%) w/Gr 2 DD. Not sure etiology. S/p cooling protocol. Unclear what degree of anoxic brain injury. Possibly some improvement. MRI not impressive. EEG non specific diffuse slowing  2. L foot osteo - s/p revision L TMA 4/25, dehisced. on IV Abx (V/Z). No imminent surgical plans 3. ESRD TTS HD. HD 5/22. Low BP's were prohibitive of fluid removal. HD 5/23 with net 4 liters off using levophed for BP support and CXR improved. HD today with 4-4.5 liter goal and can use levophed if needed  4. Acute resp failure; on vent. Sedation off now. Last CXR improved edema 5. NICM EF 35-40% 6. Shock liver - (presumed). GI following 7. DM2 8. COPD 9. CKD-MBD - changed Renvela to CaCO3 susp down tube and holding off on change in rocaltrol to IV for now 10. Anemia - Last ESA was Mircera 75 mcg 5/10. Started Darbe 100 given 5/22. S/p PRBC's X 1 for Hb 6.8 on 5/23. Transfuse 2 u with HD today.  CJamal Maes MD [facility] [phone] 02/20/2017, 9:13 AM

## 2017-02-20 NOTE — Progress Notes (Signed)
PULMONARY / CRITICAL CARE MEDICINE   Name: [name] MRN: [id number] DOB: 1980/11/19    ADMISSION DATE:  02/14/2017 CONSULTATION DATE:  02/14/17  REFERRING MD:  Feliz Beam MD  CHIEF COMPLAINT:  Sepsis, cardiac arrest  BRIEF  36 year old female w/ ESRD d/t poorly controlled DM, bilateral transmetatarsal foot amputations, obesity, HTN, medical non-compliance as well as NICM. Admitted 5/19 with complaints of left leg pain, found to have radiological evidence of osteomyelitis with wound nonhealing, dehiscence, purulent drainage from the wound. Admitted for IV antibiotics, hemodialysis and possible surgery. Found unresponsive in the room on 5/19. She had chest compressions, epi bicarb given. She was intubated by anesthesia and had ROSC in approximately 9 minutes. Downtime prior to code is unknown. She got dilaudid in the afternoon for leg pain.    STUDIES:   CULTURES: Bcx 5/19 X 2 >   ANTIBIOTICS: Clinidamycin 5/19 >5/21 Vanco 5/19 > Zosyn 5/19 >5/22 Meropenem 5/22>>>   SIGNIFICANT EVENTS: 5/18 - Lower extremity ultrasound 02/13/17> no evidence of DVT 5/19 - Admit, cardiac arrest, transfer to ICU; hypothermia protocol started. INTUBATD>. CVL PLACED  CT head 02/14/17> normal exam Chest x-ray 02/14/17> cardiomegaly, ETT, left IJ in place. I have reviewed all images personally Echo> EF 30-35%, increased LVF. Diffuse hypokinesis, akinesis of basilar mid-inferior myocardium, grade 2 diastolic dysfxn  EEG 5/20: finding c/w mod to severe global cerebral dysfxn. C/w anoxic injury given clinical course  LE [place] 5/21>>>neg 5/22 - Merropenem >>MRI brain 5/24: 1. Focus of susceptibility in the left insula may indicate a thrombosed cortical vein. The location does not correspond to a large artery. 2. No associated ischemia or hemorrhage.  Otherwise normal brain. 5/24 - maybe a little more awake. Out of shock. MRI w/out evidence of ischemia    SUBJECTIVE/OVERNIGHT/INTERVAL HX 5/25 - 101.45F  yesterday . Worst fever since admit though WBC better. EEG yesterday - diffuse slowing c/w toxi metabolic v hypoxemic v medication. GI scx for jaundice -> felt to be sepsis v meds v multifactorial. Jaundice some better. Sister and RN feels patient mental status much beter past 2-3 days. Ammonia level 24 and normal. On flexiseal    VITAL SIGNS: BP 122/67   Pulse 95   Temp 98.5 F (36.9 C) (Oral)   Resp (!) 21   Ht 5' 8.5" (1.74 m)   Wt 122.6 kg (270 lb 4.5 oz)   LMP  (LMP Unknown)   SpO2 100%   BMI 40.50 kg/m   HEMODYNAMICS: CVP:  [11 mmHg-16 mmHg] 16 mmHg  VENTILATOR SETTINGS: Vent Mode: PRVC FiO2 (%):  [30 %-40 %] 30 % Set Rate:  [18 bmp] 18 bmp Vt Set:  [520 mL] 520 mL PEEP:  [5 cmH20] 5 cmH20 Pressure Support:  [10 cmH20] 10 cmH20 Plateau Pressure:  [20 cmH20-22 cmH20] 22 cmH20  INTAKE / OUTPUT:  Intake/Output Summary (Last 24 hours) at 02/20/17 1034 Last data filed at 02/20/17 1015  Gross per 24 hour  Intake             1930 ml  Output                0 ml  Net             1930 ml     General Appearance:    Looks criticall ill OBESE - yes  Head:    Normocephalic, without obvious abnormality, atraumatic  Eyes:    PERRL - yes, conjunctiva/corneas - JAUNDICE +      Ears:  Normal external ear canals, both ears  Nose:   NG tube - no  Throat:  ETT TUBE - yes , OG tube - YES  Neck:   Supple,  No enlargement/tenderness/nodules     Lungs:     Clear to auscultation bilaterally, Ventilator   Synchrony - YES  Chest wall:    No deformity  Heart:    S1 and S2 normal, no murmur, CVP - 7.  Pressors - no  Abdomen:     Soft, no masses, no organomegaly  Genitalia:    Not done  Rectal:   not done  Extremities:   Extremities- left metatasal amputation with eschar. Rt amputee     Skin:   Intact in exposed areas . Sacral area - decub stage 2     Neurologic:   Sedation - prn versed -> RASS - -3 (was +1 per RN to +2) . Moves all 4s - yes. CAM-ICU - + for delirium . Orientation -  not oriented      LABS: PULMONARY  Recent Labs Lab 02/14/17 1835 02/15/17 0415 02/16/17 0355 02/17/17 0416  PHART 7.280* 7.441 7.461* 7.352  PCO2ART 40.8 36.0 31.8* 42.6  PO2ART 52.0* 402* 154* 148*  HCO3 19.6* 24.1 22.3 23.0  TCO2 21  --   --   --   O2SAT 86.0 99.8 98.7 98.7    CBC  Recent Labs Lab 02/18/17 0802 02/18/17 1134 02/19/17 0355 02/20/17 0415  HGB 7.4*  --  7.2* 7.2*  HCT 23.0*  --  22.6* 22.6*  WBC 30.3*  --  24.3* 20.5*  PLT 144* 132* 151 169    COAGULATION  Recent Labs Lab 02/14/17 1806 02/15/17 0530 02/18/17 1134  INR 1.46 1.52 1.38    CARDIAC   Recent Labs Lab 02/14/17 1806 02/14/17 2327 02/15/17 0530 02/15/17 1259  TROPONINI 0.06* 0.23* 0.22* 0.16*   No results for input(s): PROBNP in the last 168 hours.   CHEMISTRY  Recent Labs Lab 02/16/17 0337 02/16/17 1201 02/16/17 1622 02/17/17 0345 02/17/17 1739 02/18/17 0300 02/19/17 0355 02/20/17 0415  NA 135  --   --  134*  --  137 136 135  K 4.5  --   --  5.8*  --  4.8 3.4* 3.4*  CL 94*  --   --  95*  --  96* 96* 96*  CO2 22  --   --  23  --  26 28 29   GLUCOSE 65  --   --  108*  --  98 132* 189*  BUN 70*  --   --  81*  --  43* 36* 60*  CREATININE 4.73*  --   --  5.37*  --  3.37* 3.19* 4.23*  CALCIUM 6.8*  --   --  7.0*  --  7.5* 8.3* 8.8*  MG 1.9 1.9 2.1 2.1 2.0 1.9  --   --   PHOS 6.8* 7.6* 8.3* 9.5* 5.0* 6.1*  --   --    Estimated Creatinine Clearance: 25.3 mL/min (A) (by C-G formula based on SCr of 4.23 mg/dL (H)).   LIVER  Recent Labs Lab 02/14/17 1141 02/14/17 1806 02/15/17 0530 02/17/17 0345 02/17/17 1107 02/18/17 1134 02/19/17 0355 02/20/17 0415  AST 16  --  106* 32  --   --  15 17  ALT 17  --  69* 42  --   --  22 20  ALKPHOS 181*  --  184* 172*  --   --  382* 459*  BILITOT 4.1*  --  5.6* 11.6* 12.0*  --  12.4* 10.8*  PROT 5.8*  --  5.3* 5.3*  --   --  5.0* 5.1*  ALBUMIN 2.5*  --  2.1* 1.8*  --   --  1.5* 1.5*  INR  --  1.46 1.52  --   --  1.38   --   --      INFECTIOUS  Recent Labs Lab 02/14/17 1141 02/14/17 1624 02/14/17 1807  LATICACIDVEN 1.1 3.4* 4.6*     ENDOCRINE CBG (last 3)   Recent Labs  02/19/17 2352 02/20/17 0349 02/20/17 0754  GLUCAP 154* 181* 164*         IMAGING x48h  - image(s) personally visualized  -   highlighted in bold Mr Brain Wo Contrast  Result Date: 02/18/2017 CLINICAL DATA:  Anoxic brain injury status post cardiac arrest EXAM: MRI HEAD WITHOUT CONTRAST TECHNIQUE: Multiplanar, multiecho pulse sequences of the brain and surrounding structures were obtained without intravenous contrast. IV contrast could not be administered due to the patient's poor renal function. COMPARISON:  Head CT 02/14/2017 FINDINGS: Brain: The midline structures are normal. There is no focal diffusion restriction to indicate acute infarct. The brain parenchymal signal is normal and there is no mass lesion. There is a focal susceptibility abnormality at the left insula (series 12, image 55). Brain volume is normal for age without age-advanced or lobar predominant atrophy. The dura is normal and there is no extra-axial collection. Vascular: Major intracranial arterial and venous sinus flow voids are preserved. Skull and upper cervical spine: The visualized skull base, calvarium, upper cervical spine and extracranial soft tissues are normal. Sinuses/Orbits: No fluid levels or advanced mucosal thickening. No mastoid effusion. Normal orbits. IMPRESSION: 1. Focus of susceptibility in the left insula may indicate a thrombosed cortical vein. The location does not correspond to a large artery. 2. No associated ischemia or hemorrhage.  Otherwise normal brain. Electronically Signed   By: Deatra Robinson M.D.   On: 02/18/2017 14:55   Dg Chest Port 1 View  Result Date: 02/19/2017 CLINICAL DATA:  36 year old female with respiratory failure. EXAM: PORTABLE CHEST 1 VIEW COMPARISON:  02/18/2017 and prior radiograph FINDINGS: An endotracheal  tube with tip 3.5 cm above the carina, left IJ central venous catheter with tip overlying the lower SVC, and NG tube entering the stomach with tip off the field of view again noted. Pulmonary edema has decreased from the prior study. Continued left lower lung atelectasis/ consolidation again noted. There is no evidence of pneumothorax. No other changes are noted. IMPRESSION: Decreased pulmonary edema, otherwise unchanged appearance of the chest. Electronically Signed   By: Harmon Pier M.D.   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)   Diabetic osteomyelitis (HCC)   Osteomyelitis (HCC)   Cardiac arrest, cause unspecified (HCC)   Pressure injury of skin   Wound dehiscence   Coma (HCC)      ASSESSMENT / PLAN:  PULMONARY A: Acute respiratory failure  - Ventilator dependence s/p cardiac arrest   5/25 - mental status barrier to extubation  P Cont PSV as tolerated No extubation 02/20/2017 Full vent support 02/20/2017    CARDIOVASCULAR A:  Asystolic  arrest Acute systolic CM w/ EF decline from 35-40% down to 30-35% with GR2 DD  5/25 - off pressors  P Cont KVO IVFs Cont tele  Holding coreg, isosorbide and lipitor   RENAL A:   ESRD  P: HD per renal  GASTROINTESTINAL A:   Shock liver with Hyperbilirubinemia: ->abd [place] neg for stones of evidence of cholecystitis or obstruction; there is mention of hypoechoic lesion on liver. LDH nml. DIC panel w/ no schistocytes. Can't do MRI if can't hold breath. (per GI)   5/25 - improving jaundice. Normal ammonia  P Monitor closely   Protein calorie malnutrition   525 - tolerating TF P:   Cont tube feeds Cont SUP  HEMATOLOGIC A:   Anemia of critical  illness.  S/p 1 u prbc 5/23 and again 02/20/17 by renal. DIC neg, no evidence of hemolysis. hgb holding P:  - PRBC for hgb </= 6.9gm%    - exceptions are   -  if ACS susepcted/confirmed then transfuse for hgb </= 8.0gm%,  or    -  active bleeding with hemodynamic instability, then transfuse regardless of hemoglobin value   At at all times try to transfuse 1 unit prbc as possible with exception of active hemorrhage   INFECTIOUS A:   Left foot Osteo  5/25 - Wbc trending down but still febrile P: Meropenem day 4/vanc 7 DC vanc later 02/20/2017  ENDOCRINE A:   DM P: ssi   NEUROLOGIC A:   Acute Encephalopathy. Presumed hypoxic/ischemic +/- metabolic given -initial EEG-->diffuse slowing; CT head neg . MRI neg but for suspected clot  02/20/17 - ? Slowly imprioving . Likely toxic encephalopathy as opposed to hypoxemic P:   Cont supportive care.  Change versed prn to fent prn Neuro consult if not impriving  DISCUSSION: contiue broad supportive care  FAMILY 5/25 - sister updated    The patient is critically ill with multiple organ systems failure and requires high complexity decision making for assessment and support, frequent evaluation and titration of therapies, application of advanced monitoring technologies and extensive interpretation of multiple databases.   Critical Care Time devoted to patient care services described in this note is  32  Minutes. This time reflects time of care of this signee Dr Kalman Shan. This critical care time does not reflect procedure time, or teaching time or supervisory time of PA/NP/Med student/Med Resident etc but could involve care discussion time    Dr. Kalman Shan, M.D., [facility].C.P Pulmonary and Critical Care Medicine Staff Physician [facility] Pager: [phone], If no answer or between  15:00h - 7:00h: call [phone]  02/20/2017 10:35 AM

## 2017-02-20 NOTE — Progress Notes (Signed)
Pharmacy Antibiotic Note  [name] is a 36 y.o. female admitted on 02/14/2017 with diarrhea, chills and LLE drainage 2/2 osteomyelitis. She is s/p revision of L TMA 4/25, now with wound dehiscence.  Pharmacy has been consulted to dose meropenem. Patient is ESRD on HD TTS but has gotten dialyzed off schedule. Cultures remain negative.   Patient's WBC is decreasing and Tmax/24h 101.4. Vancomycin was stopped after her dose with HD today (of note, VR was therapeutic on 1gm IV w/ HD).   Plan: - Continue meropenem 500mg  IV every 24 hours at 1800 - Monitor clinical progress off vancomycin - Monitor HD tolerance  Height: 5' 8.5" (174 cm) Weight: 270 lb 4.5 oz (122.6 kg) IBW/kg (Calculated) : 65.05  Temp (24hrs), Avg:99.4 F (37.4 C), Min:98.2 F (36.8 C), Max:101.4 F (38.6 C)   Recent Labs Lab 02/14/17 0839 02/14/17 1141 02/14/17 1624  02/14/17 1807  02/16/17 0337 02/17/17 0345 02/18/17 0300 02/18/17 0802 02/19/17 0355 02/20/17 0415 02/20/17 0812  WBC  --   --   --   --   --   < > 30.2* 36.7* 32.3* 30.3* 24.3* 20.5*  --   CREATININE  --  6.23*  --   < >  --   < > 4.73* 5.37* 3.37*  --  3.19* 4.23*  --   LATICACIDVEN 1.45 1.1 3.4*  --  4.6*  --   --   --   --   --   --   --   --   VANCORANDOM  --   --   --   --   --   --   --   --   --   --   --   --  23  < > = values in this interval not displayed.  Estimated Creatinine Clearance: 25.3 mL/min (A) (by C-G formula based on SCr of 4.23 mg/dL (H)).    Allergies  Allergen Reactions  . Aspirin Anaphylaxis  . Sulfur Hives  . Tramadol Hives and Other (See Comments)    Pt states she feels weird   . Gabapentin Other (See Comments)    Lethargic     Antimicrobials this admission: Vanc 5/19>> 5/25 Zosyn 5/19>> 5/22 Clindamycin 5/19>> 5/21 Meropenem 5/22 >>   Microbiology results: 5/19 BCx: ngtd 5/19:  MRSA PCR: neg  Carylon Perches, PharmD Acute Care Pharmacy Resident  Pager: [phone] 02/20/2017

## 2017-02-21 ENCOUNTER — Inpatient Hospital Stay (HOSPITAL_COMMUNITY): Payer: Medicaid Other

## 2017-02-21 DIAGNOSIS — I428 Other cardiomyopathies: Secondary | ICD-10-CM

## 2017-02-21 LAB — BPAM RBC
BLOOD PRODUCT EXPIRATION DATE: 201806142359
Blood Product Expiration Date: 201806142359
Blood Product Expiration Date: 201806182359
ISSUE DATE / TIME: 201805230528
ISSUE DATE / TIME: 201805250857
ISSUE DATE / TIME: 201805250857
UNIT TYPE AND RH: 5100
UNIT TYPE AND RH: 5100
Unit Type and Rh: 5100

## 2017-02-21 LAB — TYPE AND SCREEN
ABO/RH(D): O POS
ANTIBODY SCREEN: NEGATIVE
UNIT DIVISION: 0
UNIT DIVISION: 0
Unit division: 0

## 2017-02-21 LAB — GLUCOSE, CAPILLARY
GLUCOSE-CAPILLARY: 172 mg/dL — AB (ref 65–99)
GLUCOSE-CAPILLARY: 174 mg/dL — AB (ref 65–99)
GLUCOSE-CAPILLARY: 179 mg/dL — AB (ref 65–99)
Glucose-Capillary: 156 mg/dL — ABNORMAL HIGH (ref 65–99)
Glucose-Capillary: 174 mg/dL — ABNORMAL HIGH (ref 65–99)
Glucose-Capillary: 179 mg/dL — ABNORMAL HIGH (ref 65–99)
Glucose-Capillary: 195 mg/dL — ABNORMAL HIGH (ref 65–99)

## 2017-02-21 LAB — RENAL FUNCTION PANEL
ALBUMIN: 1.6 g/dL — AB (ref 3.5–5.0)
ANION GAP: 11 (ref 5–15)
BUN: 51 mg/dL — ABNORMAL HIGH (ref 6–20)
CHLORIDE: 98 mmol/L — AB (ref 101–111)
CO2: 28 mmol/L (ref 22–32)
Calcium: 9 mg/dL (ref 8.9–10.3)
Creatinine, Ser: 3.35 mg/dL — ABNORMAL HIGH (ref 0.44–1.00)
GFR calc Af Amer: 19 mL/min — ABNORMAL LOW (ref >60)
GFR, EST NON AFRICAN AMERICAN: 16 mL/min — AB (ref >60)
GLUCOSE: 190 mg/dL — AB (ref 65–99)
PHOSPHORUS: 2.6 mg/dL (ref 2.5–4.6)
POTASSIUM: 3.9 mmol/L (ref 3.5–5.1)
Sodium: 137 mmol/L (ref 135–145)

## 2017-02-21 LAB — CBC
HEMATOCRIT: 28 % — AB (ref 36.0–46.0)
HEMOGLOBIN: 9 g/dL — AB (ref 12.0–15.0)
MCH: 29.8 pg (ref 26.0–34.0)
MCHC: 32.1 g/dL (ref 30.0–36.0)
MCV: 92.7 fL (ref 78.0–100.0)
Platelets: 222 10*3/uL (ref 150–400)
RBC: 3.02 MIL/uL — ABNORMAL LOW (ref 3.87–5.11)
RDW: 17.4 % — ABNORMAL HIGH (ref 11.5–15.5)
WBC: 18.8 10*3/uL — AB (ref 4.0–10.5)

## 2017-02-21 LAB — HEPATIC FUNCTION PANEL
ALBUMIN: 1.5 g/dL — AB (ref 3.5–5.0)
ALK PHOS: 516 U/L — AB (ref 38–126)
ALT: 21 U/L (ref 14–54)
AST: 34 U/L (ref 15–41)
BILIRUBIN TOTAL: 10.6 mg/dL — AB (ref 0.3–1.2)
Bilirubin, Direct: 6.4 mg/dL — ABNORMAL HIGH (ref 0.1–0.5)
Indirect Bilirubin: 4.2 mg/dL — ABNORMAL HIGH (ref 0.3–0.9)
Total Protein: 5.5 g/dL — ABNORMAL LOW (ref 6.5–8.1)

## 2017-02-21 LAB — PROTIME-INR
INR: 1.27
Prothrombin Time: 16 seconds — ABNORMAL HIGH (ref 11.4–15.2)

## 2017-02-21 MED ORDER — PANTOPRAZOLE SODIUM 40 MG PO PACK
40.0000 mg | PACK | ORAL | Status: DC
  Administered 2017-02-21 – 2017-04-07 (×40): 40 mg
  Filled 2017-02-21 (×43): qty 20

## 2017-02-21 MED ORDER — CHLORHEXIDINE GLUCONATE 0.12 % MT SOLN
OROMUCOSAL | Status: AC
  Filled 2017-02-21: qty 15

## 2017-02-21 NOTE — Progress Notes (Signed)
PULMONARY / CRITICAL CARE MEDICINE   Name: Reginia Slight MRN: [id number] DOB: 02-06-1981    ADMISSION DATE:  02/14/2017 CONSULTATION DATE:  02/14/2017  REFERRING MD:  Feliz Beam MD  CHIEF COMPLAINT:  Sepsis, cardiac arrest  HISTORY OF PRESENT ILLNESS:   36 year old female w/ ESRD d/t poorly controlled DM, bilateral transmetatarsal foot amputations, obesity, HTN, medical non-compliance as well as NICM. Admitted 5/19 with complaints of left leg pain, found to have radiological evidence of osteomyelitis with wound nonhealing, dehiscence, purulent drainage from the wound. Admitted for IV antibiotics, hemodialysis and possible surgery.  Found unresponsive in the room on 5/19. She had chest compressions, epi bicarb given. She was intubated by anesthesia and had ROSC in approximately 9 minutes. Downtime prior to code is unknown. She got dilaudid in the afternoon for leg pain.  SUBJECTIVE:  Tolerating pressure support 12/5.  VITAL SIGNS: BP 137/86   Pulse (!) 115   Temp 100.1 F (37.8 C) (Axillary)   Resp (!) 21   Ht 5' 8.5" (1.74 m)   Wt 263 lb 7.2 oz (119.5 kg)   LMP  (LMP Unknown)   SpO2 99%   BMI 39.47 kg/m   VENTILATOR SETTINGS: Vent Mode: CPAP;PSV FiO2 (%):  [30 %] 30 % Set Rate:  [18 bmp] 18 bmp Vt Set:  [520 mL] 520 mL PEEP:  [5 cmH20] 5 cmH20 Pressure Support:  [10 cmH20] 10 cmH20 Plateau Pressure:  [21 cmH20-25 cmH20] 22 cmH20  INTAKE / OUTPUT: I/O last 3 completed shifts: In: 2200 [I.V.:370; Blood:670; NG/GT:810; IV Piggyback:350] Out: 5350 [Other:5000; Stool:350]  General - unresponsive Eyes - pupils reactive ENT - ETT in place Cardiac - regular, no murmur Chest - basilar crackles Abd - soft, non tender Ext - 1+ edema Skin - no rashes Neuro - not following commands  LABS:  BMET  Recent Labs Lab 02/19/17 0355 02/20/17 0415 02/21/17 0423  NA 136 135 137  K 3.4* 3.4* 3.9  CL 96* 96* 98*  CO2 28 29 28   BUN 36* 60* 51*  CREATININE 3.19* 4.23* 3.35*   GLUCOSE 132* 189* 190*    Electrolytes  Recent Labs Lab 02/17/17 0345 02/17/17 1739 02/18/17 0300 02/19/17 0355 02/20/17 0415 02/21/17 0423  CALCIUM 7.0*  --  7.5* 8.3* 8.8* 9.0  MG 2.1 2.0 1.9  --   --   --   PHOS 9.5* 5.0* 6.1*  --   --  2.6    CBC  Recent Labs Lab 02/19/17 0355 02/20/17 0415 02/21/17 0423  WBC 24.3* 20.5* 18.8*  HGB 7.2* 7.2* 9.0*  HCT 22.6* 22.6* 28.0*  PLT 151 169 222    Coag's  Recent Labs Lab 02/14/17 1806 02/15/17 0530 02/18/17 1134 02/21/17 0423  APTT 39*  --  42*  --   INR 1.46 1.52 1.38 1.27    Sepsis Markers  Recent Labs Lab 02/14/17 1141 02/14/17 1624 02/14/17 1807  LATICACIDVEN 1.1 3.4* 4.6*    ABG  Recent Labs Lab 02/15/17 0415 02/16/17 0355 02/17/17 0416  PHART 7.441 7.461* 7.352  PCO2ART 36.0 31.8* 42.6  PO2ART 402* 154* 148*    Liver Enzymes  Recent Labs Lab 02/19/17 0355 02/20/17 0415 02/21/17 0423  AST 15 17 34  ALT 22 20 21   ALKPHOS 382* 459* 516*  BILITOT 12.4* 10.8* 10.6*  ALBUMIN 1.5* 1.5* 1.5*  1.6*    Cardiac Enzymes  Recent Labs Lab 02/14/17 2327 02/15/17 0530 02/15/17 1259  TROPONINI 0.23* 0.22* 0.16*    Glucose  Recent Labs Lab 02/20/17 1127 02/20/17 1534 02/20/17 2031 02/20/17 2358 02/21/17 0318 02/21/17 0846  GLUCAP 139* 148* 162* 179* 156* 172*   STUDIES:  Lower extremity ultrasound 02/13/17 >> no evidence of DVT CT head 02/14/17 >> normal exam Echo >> EF 30-35%, increased LVF. Diffuse hypokinesis, akinesis of basilar mid-inferior myocardium, grade 2 diastolic dysfxn  EEG 5/20 >> finding c/w mod to severe global cerebral dysfxn. C/w anoxic injury given clinical course  LE [place] 5/21 >> negative MRI brain 5/24 >> ?thrombosed cortical vein, otherwise normal  CULTURES: Bcx 5/19 X 2 >> negative  ANTIBIOTICS: Clinidamycin 5/19 >> 5/21 Vanco 5/19 >> Zosyn 5/19 >> 5/22 Meropenem 5/22 >>  SIGNIFICANT EVENTS: 5/19 Admit, cardiac arrest, transfer to ICU;  hypothermia protocol started 5/24 off pressors.  MRI w/out evidence of ischemia   LINES/TUBES: ETT 5/19 >> Lt IJ CVL 5/19 >>  ASSESSMENT / PLAN:  Acute respiratory failure after cardiac arrest with pulmonary edema. - pressure support wean >> mental status precludes extubation attempt - f/u CXR   Asystolic cardiac arrest. 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 Hx of HLD. - hold coreg, isosorbide - hold lipitor in setting of elevated LFTs  ESRD. Hypokalemia. - f/u BMET - HD per renal  Jaundice from shock liver with elevated LFTs. - f/u LFTs  Moderate protein calorie malnutrition. - tube feeds  Anemia of critical illness and chronic disease. - f/u CBC - aranesp  Lt foot osteomyelitis. - continue Abx  DM type II. - SSI  Acute metabolic encephalopathy. Anoxic encephalopathy after cardiac arrest. Hx of DM neuropathy. - monitor mental status - hold outpt cymbalta, lyrica  Resolved problems >> hypotension from sedation  DVT prophylaxis - SQ heparin SUP - protonix Nutrition - tube feeds Goals of care - full code  Updated pt's mother at bedside  CC time 32 minutes  Coralyn Helling, MD [facility] Pulmonary/Critical Care 02/21/2017, 10:48 AM Pager:  [phone] After 3pm call: [phone]

## 2017-02-21 NOTE — Progress Notes (Signed)
    No charge note -------------------------  - Patient remains intubated. Discussed with the nursing staff. No new active GI issues. - LFTs  relatively stable. Monitor LFTs. - Recommend MRI-MRCP as an outpatient basis once acute issues are resolved or as an inpatient basis if she has worsening LFTs. - GI will follow from distance    Kathi Der MD, FACP 02/21/2017, 12:56 PM  Pager [phone]  If no answer or after 5 PM call [phone]

## 2017-02-21 NOTE — Progress Notes (Signed)
[facility] Progress Note    Subjective:   Had HD again yesterday for volume Were able to get off 5 liters of fluid Weight down to 118.9 post HD from 122.6 EDW 114 kg so making some headway Got fentanyl during the night for agitation Mom says she followed finger with her eyes yesterday - not doing anything for me x pain response   Vitals:   02/21/17 0600 02/21/17 0700 02/21/17 0849 02/21/17 0908  BP: (!) 141/83 (!) 153/85  137/86  Pulse: (!) 105 (!) 115    Resp: 11 (!) 21    Temp:   100.1 F (37.8 C)   TempSrc:   Axillary   SpO2: 99% 99%    Weight:      Height:       Exam: (Sedation off since 8AM 5/21) L IJ CVL Orally intubated/OG tube  Scleral icterus No jvd Lungs coarse BS Regular rhythm tachy 110  L foot bandaged R foot metatarsal amp Chronic woody changes both LE's  Pitting edema of thighs and LE's unchanged Withdraws and grimaces when LLE examined LUA AVF +bruit  Inpatient medications: . calcium carbonate (dosed in mg elemental calcium)  2,000 mg of elemental calcium Oral TID  . chlorhexidine gluconate (MEDLINE KIT)  15 mL Mouth Rinse BID  . Chlorhexidine Gluconate Cloth  6 each Topical Q0600  . darbepoetin (ARANESP) injection - DIALYSIS  100 mcg Intravenous Q Tue-HD  . feeding supplement (NEPRO CARB STEADY)  1,000 mL Per Tube Q24H  . feeding supplement (PRO-STAT SUGAR FREE 64)  60 mL Per Tube QID  . [name]'s butt cream   Topical BID  . heparin  5,000 Units Subcutaneous Q8H  . insulin aspart  0-9 Units Subcutaneous Q4H  . mouth rinse  15 mL Mouth Rinse 10 times per day  . sodium chloride flush  10-40 mL Intracatheter Q12H   . sodium chloride 10 mL/hr at 02/20/17 0800  . sodium chloride 10 mL (02/20/17 2030)  . sodium chloride    . famotidine (PEPCID) IV Stopped (02/20/17 2300)  . meropenem (MERREM) IV Stopped (02/20/17 1828)   albuterol, budesonide, fentaNYL (SUBLIMAZE) injection, sodium chloride flush  CXR - no active disease 5/19  am CXR 5/21 Stable CM ECHO 5/20 30-35%, Grade 2  CXR 5/22 mild vasc congestion CXR 5/23 worsening vascular congestion CXR 5/24 decreased pulmonary edema MRI 02/18/17 Focus of susceptibility in the left insula may indicate a thrombosed cortical vein. The location does not correspond to a large artery. 2. No associated ischemia or hemorrhage. Otherwise normal brain   Recent Labs Lab 02/17/17 1739 02/18/17 0300 02/19/17 0355 02/20/17 0415 02/21/17 0423  NA  --  137 136 135 137  K  --  4.8 3.4* 3.4* 3.9  CL  --  96* 96* 96* 98*  CO2  --  26 28 29 28   GLUCOSE  --  98 132* 189* 190*  BUN  --  43* 36* 60* 51*  CREATININE  --  3.37* 3.19* 4.23* 3.35*  CALCIUM  --  7.5* 8.3* 8.8* 9.0  PHOS 5.0* 6.1*  --   --  2.6    Recent Labs Lab 02/19/17 0355 02/20/17 0415 02/21/17 0423  AST 15 17 34  ALT 22 20 21   ALKPHOS 382* 459* 516*  BILITOT 12.4* 10.8* 10.6*  PROT 5.0* 5.1* 5.5*  ALBUMIN 1.5* 1.5* 1.5*  1.6*    Recent Labs Lab 02/19/17 0355 02/20/17 0415 02/21/17 0423  WBC 24.3* 20.5* 18.8*  HGB 7.2*  7.2* 9.0*  HCT 22.6* 22.6* 28.0*  MCV 92.2 93.0 92.7  PLT 151 169 222    Dialysis: TTS [facility] 4h 3mn    114kg    2/2 bath  LUA AVF - venofer 50/wk - mircera 75 every 2 wks, last 5/10 - calcitriol 0.75 ug tiw  Summary: 36year old female w/ ESRD d/t poorly controlled DM, HD bilateral TMA's, obesity, HTN, medical non-compliance, NICM. Admitted 5/19 w/left leg pain, + radiological evidence of osteomyelitis w/wound nonhealing, dehiscence, purulent drainage. Admitted for IV antibiotics, hemodialysis and possible surgery. Found unresponsive in the room on 5/19. Rec'd chest compressions, epi bicarb; intubated; cooled. We are providing HD.    Assessment: 1. Cardiac arrest (5/19, asystole). CPR in house. Small decline EF (to 30-35%) w/Gr 2 DD. Not sure etiology. S/p cooling protocol. 2. AMS/anoxic brain injury - Unclear what degree of recovery to expect. Possibly some improvement  since initial event. MRI not impressive. EEG non specific diffuse slowing. Watchful waiting. Per neuro 3. L foot osteo - s/p revision L TMA 4/25, dehisced. on IV Abx (vanco/meropenem). No imminent surgical plans . WBC rising. 4. ESRD TTS HD. HD 5/22, 5/24, 5/25 (using levophed prn for BP support)  CXR improved. HD again today with 4-4.5 liter goal and then can try to go back to TTS schedule  5. Acute resp failure; on vent. Sedation off now. 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 Anemia - Last ESA was Mircera 75 mcg 5/10. Started Darbe 100 given 5/22. S/p PRBC's X 1 for Hb 6.8 on 5/23. Transfused 2 u with HD 5/25. Hb up to 9. 8. NICM EF 35-40% 9. Shock liver - (presumed). Persistent elevation. 10. DM2    CJamal Maes MD CLaconaPager 02/21/2017, 9:29 AM

## 2017-02-22 ENCOUNTER — Inpatient Hospital Stay (HOSPITAL_COMMUNITY): Payer: Medicaid Other

## 2017-02-22 DIAGNOSIS — J9601 Acute respiratory failure with hypoxia: Secondary | ICD-10-CM

## 2017-02-22 LAB — GLUCOSE, CAPILLARY
GLUCOSE-CAPILLARY: 212 mg/dL — AB (ref 65–99)
GLUCOSE-CAPILLARY: 198 mg/dL — AB (ref 65–99)
GLUCOSE-CAPILLARY: 163 mg/dL — AB (ref 65–99)
Glucose-Capillary: 224 mg/dL — ABNORMAL HIGH (ref 65–99)

## 2017-02-22 LAB — HEPATIC FUNCTION PANEL
ALBUMIN: 1.6 g/dL — AB (ref 3.5–5.0)
ALK PHOS: 541 U/L — AB (ref 38–126)
ALT: 19 U/L (ref 14–54)
AST: 39 U/L (ref 15–41)
BILIRUBIN INDIRECT: 3.9 mg/dL — AB (ref 0.3–0.9)
BILIRUBIN TOTAL: 8.3 mg/dL — AB (ref 0.3–1.2)
Bilirubin, Direct: 4.4 mg/dL — ABNORMAL HIGH (ref 0.1–0.5)
Total Protein: 5.6 g/dL — ABNORMAL LOW (ref 6.5–8.1)

## 2017-02-22 LAB — CBC
HEMATOCRIT: 28.8 % — AB (ref 36.0–46.0)
HEMOGLOBIN: 9.1 g/dL — AB (ref 12.0–15.0)
MCH: 29.4 pg (ref 26.0–34.0)
MCHC: 31.6 g/dL (ref 30.0–36.0)
MCV: 93.2 fL (ref 78.0–100.0)
Platelets: 312 10*3/uL (ref 150–400)
RBC: 3.09 MIL/uL — ABNORMAL LOW (ref 3.87–5.11)
RDW: 17.3 % — ABNORMAL HIGH (ref 11.5–15.5)
WBC: 18.9 10*3/uL — ABNORMAL HIGH (ref 4.0–10.5)

## 2017-02-22 LAB — RENAL FUNCTION PANEL
ANION GAP: 11 (ref 5–15)
Albumin: 1.7 g/dL — ABNORMAL LOW (ref 3.5–5.0)
BUN: 70 mg/dL — ABNORMAL HIGH (ref 6–20)
CO2: 27 mmol/L (ref 22–32)
Calcium: 9.2 mg/dL (ref 8.9–10.3)
Chloride: 98 mmol/L — ABNORMAL LOW (ref 101–111)
Creatinine, Ser: 4.27 mg/dL — ABNORMAL HIGH (ref 0.44–1.00)
GFR calc Af Amer: 14 mL/min — ABNORMAL LOW (ref >60)
GFR calc non Af Amer: 12 mL/min — ABNORMAL LOW (ref >60)
GLUCOSE: 195 mg/dL — AB (ref 65–99)
POTASSIUM: 5.3 mmol/L — AB (ref 3.5–5.1)
Phosphorus: 2.7 mg/dL (ref 2.5–4.6)
Sodium: 136 mmol/L (ref 135–145)

## 2017-02-22 MED ORDER — INSULIN ASPART 100 UNIT/ML ~~LOC~~ SOLN
0.0000 [IU] | SUBCUTANEOUS | Status: DC
  Administered 2017-02-22 (×2): 3 [IU] via SUBCUTANEOUS
  Administered 2017-02-22: 2 [IU] via SUBCUTANEOUS
  Administered 2017-02-23 (×3): 3 [IU] via SUBCUTANEOUS
  Administered 2017-02-23: 5 [IU] via SUBCUTANEOUS
  Administered 2017-02-23: 3 [IU] via SUBCUTANEOUS
  Administered 2017-02-23: 5 [IU] via SUBCUTANEOUS
  Administered 2017-02-24: 2 [IU] via SUBCUTANEOUS
  Administered 2017-02-24: 5 [IU] via SUBCUTANEOUS
  Administered 2017-02-24 – 2017-02-25 (×4): 3 [IU] via SUBCUTANEOUS
  Administered 2017-02-25: 2 [IU] via SUBCUTANEOUS
  Administered 2017-02-25: 8 [IU] via SUBCUTANEOUS
  Administered 2017-02-25: 3 [IU] via SUBCUTANEOUS
  Administered 2017-02-25 (×2): 2 [IU] via SUBCUTANEOUS
  Administered 2017-02-26 – 2017-02-27 (×5): 3 [IU] via SUBCUTANEOUS
  Administered 2017-02-27 – 2017-02-28 (×7): 2 [IU] via SUBCUTANEOUS
  Administered 2017-03-01 (×4): 3 [IU] via SUBCUTANEOUS
  Administered 2017-03-01 – 2017-03-02 (×5): 2 [IU] via SUBCUTANEOUS
  Administered 2017-03-02 – 2017-03-03 (×4): 3 [IU] via SUBCUTANEOUS
  Administered 2017-03-03: 2 [IU] via SUBCUTANEOUS
  Administered 2017-03-03 (×2): 3 [IU] via SUBCUTANEOUS
  Administered 2017-03-03: 2 [IU] via SUBCUTANEOUS
  Administered 2017-03-04 – 2017-03-06 (×12): 3 [IU] via SUBCUTANEOUS
  Administered 2017-03-06: 5 [IU] via SUBCUTANEOUS
  Administered 2017-03-06: 2 [IU] via SUBCUTANEOUS
  Administered 2017-03-06: 3 [IU] via SUBCUTANEOUS
  Administered 2017-03-06: 2 [IU] via SUBCUTANEOUS
  Administered 2017-03-07 (×3): 3 [IU] via SUBCUTANEOUS
  Administered 2017-03-07 (×2): 2 [IU] via SUBCUTANEOUS
  Administered 2017-03-07 – 2017-03-08 (×6): 3 [IU] via SUBCUTANEOUS
  Administered 2017-03-09: 5 [IU] via SUBCUTANEOUS
  Administered 2017-03-09 (×6): 3 [IU] via SUBCUTANEOUS
  Administered 2017-03-10 (×2): 2 [IU] via SUBCUTANEOUS
  Administered 2017-03-10: 3 [IU] via SUBCUTANEOUS
  Administered 2017-03-10: 5 [IU] via SUBCUTANEOUS
  Administered 2017-03-10 – 2017-03-11 (×3): 3 [IU] via SUBCUTANEOUS
  Administered 2017-03-11: 5 [IU] via SUBCUTANEOUS
  Administered 2017-03-11 – 2017-03-12 (×5): 3 [IU] via SUBCUTANEOUS
  Administered 2017-03-13: 2 [IU] via SUBCUTANEOUS
  Administered 2017-03-13 (×3): 3 [IU] via SUBCUTANEOUS
  Administered 2017-03-14 (×2): 2 [IU] via SUBCUTANEOUS
  Administered 2017-03-14 – 2017-03-15 (×2): 3 [IU] via SUBCUTANEOUS
  Administered 2017-03-15: 2 [IU] via SUBCUTANEOUS
  Administered 2017-03-15 (×3): 3 [IU] via SUBCUTANEOUS
  Administered 2017-03-15: 1 [IU] via SUBCUTANEOUS
  Administered 2017-03-15: 2 [IU] via SUBCUTANEOUS
  Administered 2017-03-16 (×2): 3 [IU] via SUBCUTANEOUS
  Administered 2017-03-16 – 2017-03-17 (×5): 2 [IU] via SUBCUTANEOUS
  Administered 2017-03-17 – 2017-03-18 (×4): 3 [IU] via SUBCUTANEOUS
  Administered 2017-03-18: 5 [IU] via SUBCUTANEOUS
  Administered 2017-03-18 – 2017-03-19 (×3): 3 [IU] via SUBCUTANEOUS
  Administered 2017-03-19: 2 [IU] via SUBCUTANEOUS
  Administered 2017-03-19: 3 [IU] via SUBCUTANEOUS
  Administered 2017-03-19: 2 [IU] via SUBCUTANEOUS
  Administered 2017-03-19: 3 [IU] via SUBCUTANEOUS
  Administered 2017-03-19: 2 [IU] via SUBCUTANEOUS
  Administered 2017-03-20: 3 [IU] via SUBCUTANEOUS
  Administered 2017-03-20: 2 [IU] via SUBCUTANEOUS
  Administered 2017-03-20: 3 [IU] via SUBCUTANEOUS
  Administered 2017-03-20: 2 [IU] via SUBCUTANEOUS
  Administered 2017-03-21 (×3): 3 [IU] via SUBCUTANEOUS
  Administered 2017-03-21: 2 [IU] via SUBCUTANEOUS
  Administered 2017-03-21: 5 [IU] via SUBCUTANEOUS
  Administered 2017-03-21 (×2): 3 [IU] via SUBCUTANEOUS
  Administered 2017-03-22: 5 [IU] via SUBCUTANEOUS
  Administered 2017-03-22 (×2): 3 [IU] via SUBCUTANEOUS
  Administered 2017-03-22: 5 [IU] via SUBCUTANEOUS
  Administered 2017-03-22: 2 [IU] via SUBCUTANEOUS
  Administered 2017-03-22: 5 [IU] via SUBCUTANEOUS
  Administered 2017-03-23: 2 [IU] via SUBCUTANEOUS
  Administered 2017-03-23: 3 [IU] via SUBCUTANEOUS
  Administered 2017-03-23: 2 [IU] via SUBCUTANEOUS
  Administered 2017-03-23: 3 [IU] via SUBCUTANEOUS
  Administered 2017-03-24: 5 [IU] via SUBCUTANEOUS
  Administered 2017-03-24: 2 [IU] via SUBCUTANEOUS
  Administered 2017-03-24 (×2): 3 [IU] via SUBCUTANEOUS
  Administered 2017-03-24: 5 [IU] via SUBCUTANEOUS
  Administered 2017-03-25: 3 [IU] via SUBCUTANEOUS
  Administered 2017-03-25: 5 [IU] via SUBCUTANEOUS
  Administered 2017-03-25: 3 [IU] via SUBCUTANEOUS
  Administered 2017-03-25: 2 [IU] via SUBCUTANEOUS
  Administered 2017-03-25 – 2017-03-26 (×3): 3 [IU] via SUBCUTANEOUS
  Administered 2017-03-26 (×2): 8 [IU] via SUBCUTANEOUS
  Administered 2017-03-26 – 2017-03-27 (×4): 5 [IU] via SUBCUTANEOUS
  Administered 2017-03-27: 15 [IU] via SUBCUTANEOUS
  Administered 2017-03-27: 5 [IU] via SUBCUTANEOUS
  Administered 2017-03-27: 3 [IU] via SUBCUTANEOUS
  Administered 2017-03-27: 5 [IU] via SUBCUTANEOUS
  Administered 2017-03-27: 8 [IU] via SUBCUTANEOUS
  Administered 2017-03-28 (×5): 3 [IU] via SUBCUTANEOUS
  Administered 2017-03-28 – 2017-03-29 (×3): 2 [IU] via SUBCUTANEOUS
  Administered 2017-03-29: 5 [IU] via SUBCUTANEOUS
  Administered 2017-03-30 (×2): 2 [IU] via SUBCUTANEOUS
  Administered 2017-03-31 – 2017-04-01 (×2): 3 [IU] via SUBCUTANEOUS
  Administered 2017-04-01: 2 [IU] via SUBCUTANEOUS
  Administered 2017-04-02: 5 [IU] via SUBCUTANEOUS
  Administered 2017-04-02: 3 [IU] via SUBCUTANEOUS
  Administered 2017-04-02: 2 [IU] via SUBCUTANEOUS
  Administered 2017-04-04: 3 [IU] via SUBCUTANEOUS
  Administered 2017-04-04: 2 [IU] via SUBCUTANEOUS
  Administered 2017-04-04: 5 [IU] via SUBCUTANEOUS
  Administered 2017-04-04: 3 [IU] via SUBCUTANEOUS
  Administered 2017-04-05: 5 [IU] via SUBCUTANEOUS
  Administered 2017-04-05 (×2): 2 [IU] via SUBCUTANEOUS
  Administered 2017-04-05: 8 [IU] via SUBCUTANEOUS
  Administered 2017-04-06: 2 [IU] via SUBCUTANEOUS
  Administered 2017-04-06 (×2): 5 [IU] via SUBCUTANEOUS
  Administered 2017-04-06: 2 [IU] via SUBCUTANEOUS
  Administered 2017-04-07: 3 [IU] via SUBCUTANEOUS

## 2017-02-22 MED ORDER — ALBUMIN HUMAN 25 % IV SOLN
75.0000 g | Freq: Once | INTRAVENOUS | Status: AC
Start: 2017-02-22 — End: 2017-02-22
  Administered 2017-02-22: 25 g via INTRAVENOUS
  Filled 2017-02-22: qty 300

## 2017-02-22 MED ORDER — ALBUMIN HUMAN 25 % IV SOLN
INTRAVENOUS | Status: AC
  Administered 2017-02-22: 25 g via INTRAVENOUS
  Filled 2017-02-22: qty 200

## 2017-02-22 MED ORDER — ACETAMINOPHEN 160 MG/5ML PO SOLN
650.0000 mg | Freq: Four times a day (QID) | ORAL | Status: DC | PRN
  Administered 2017-02-23 – 2017-03-26 (×23): 650 mg
  Filled 2017-02-22 (×24): qty 20.3

## 2017-02-22 MED ORDER — NOREPINEPHRINE BITARTRATE 1 MG/ML IV SOLN
0.0000 ug/min | INTRAVENOUS | Status: DC
  Filled 2017-02-22: qty 4

## 2017-02-22 MED ORDER — ALBUMIN HUMAN 25 % IV SOLN
INTRAVENOUS | Status: AC
  Filled 2017-02-22: qty 50

## 2017-02-22 NOTE — Progress Notes (Signed)
PULMONARY / CRITICAL CARE MEDICINE   Name: [name] MRN: [id number] DOB: [birth year]    ADMISSION DATE:  02/14/2017 CONSULTATION DATE:  02/14/2017  REFERRING MD:  Feliz Beam MD  CHIEF COMPLAINT:  Sepsis, cardiac arrest  HISTORY OF PRESENT ILLNESS:   36 year old female w/ ESRD d/t poorly controlled DM, bilateral transmetatarsal foot amputations, obesity, HTN, medical non-compliance as well as NICM. Admitted 5/19 with complaints of left leg pain, found to have radiological evidence of osteomyelitis with wound nonhealing, dehiscence, purulent drainage from the wound. Admitted for IV antibiotics, hemodialysis and possible surgery.  Found unresponsive in the room on 5/19. She had chest compressions, epi bicarb given. She was intubated by anesthesia and had ROSC in approximately 9 minutes. Downtime prior to code is unknown. She got dilaudid in the afternoon for leg pain.  SUBJECTIVE:  Pt w/ minimal responsiveness, opens eyes to voice ?tracking . No sedation. Not following commands.  For HD today .   Mother at bedside and updated.  Fever overnight    VITAL SIGNS: BP 138/82   Pulse (!) 117   Temp 99.1 F (37.3 C) (Oral)   Resp (!) 23   Ht 5' 8.5" (1.74 m)   Wt 263 lb 14.3 oz (119.7 kg)   LMP  (LMP Unknown)   SpO2 100%   BMI 39.54 kg/m   VENTILATOR SETTINGS: Vent Mode: PRVC FiO2 (%):  [30 %] 30 % Set Rate:  [18 bmp] 18 bmp Vt Set:  [520 mL] 520 mL PEEP:  [5 cmH20] 5 cmH20 Pressure Support:  [12 cmH20] 12 cmH20 Plateau Pressure:  [22 cmH20-27 cmH20] 27 cmH20  INTAKE / OUTPUT: I/O last 3 completed shifts: In: 1460 [I.V.:360; NG/GT:1050; IV Piggyback:50] Out: 350 [Stool:350]  General  Critically ill appearing female on vent  Eyes - pupils reactive  ENT -ETT in place  Cardiac - RRR no mr//g Chest - BB crackles  Abd -soft obese , +BS  Ext - 1+edema  Skin - no rashes  Neuro - opens eyes to voice, not following commands.   LABS:  BMET  Recent Labs Lab 02/20/17 0415  02/21/17 0423 02/22/17 0430  NA 135 137 136  K 3.4* 3.9 5.3*  CL 96* 98* 98*  CO2 29 28 27   BUN 60* 51* 70*  CREATININE 4.23* 3.35* 4.27*  GLUCOSE 189* 190* 195*    Electrolytes  Recent Labs Lab 02/17/17 0345 02/17/17 1739 02/18/17 0300  02/20/17 0415 02/21/17 0423 02/22/17 0430  CALCIUM 7.0*  --  7.5*  < > 8.8* 9.0 9.2  MG 2.1 2.0 1.9  --   --   --   --   PHOS 9.5* 5.0* 6.1*  --   --  2.6 2.7  < > = values in this interval not displayed.  CBC  Recent Labs Lab 02/20/17 0415 02/21/17 0423 02/22/17 0430  WBC 20.5* 18.8* 18.9*  HGB 7.2* 9.0* 9.1*  HCT 22.6* 28.0* 28.8*  PLT 169 222 312    Coag's  Recent Labs Lab 02/18/17 1134 02/21/17 0423  APTT 42*  --   INR 1.38 1.27    Sepsis Markers No results for input(s): LATICACIDVEN, PROCALCITON, O2SATVEN in the last 168 hours.  ABG  Recent Labs Lab 02/16/17 0355 02/17/17 0416  PHART 7.461* 7.352  PCO2ART 31.8* 42.6  PO2ART 154* 148*    Liver Enzymes  Recent Labs Lab 02/20/17 0415 02/21/17 0423 02/22/17 0430  AST 17 34 39  ALT 20 21 19   ALKPHOS 459* 516* 541*  BILITOT 10.8* 10.6* 8.3*  ALBUMIN 1.5* 1.5*  1.6* 1.6*  1.7*    Cardiac Enzymes  Recent Labs Lab 02/15/17 1259  TROPONINI 0.16*    Glucose  Recent Labs Lab 02/21/17 1123 02/21/17 1658 02/21/17 1939 02/21/17 2340 02/22/17 0345 02/22/17 0852  GLUCAP 174* 174* 195* 179* 212* 224*   STUDIES:  Lower extremity ultrasound 02/13/17 >> no evidence of DVT CT head 02/14/17 >> normal exam Echo >> EF 30-35%, increased LVF. Diffuse hypokinesis, akinesis of basilar mid-inferior myocardium, grade 2 diastolic dysfxn  EEG 5/20 >> finding c/w mod to severe global cerebral dysfxn. C/w anoxic injury given clinical course  LE [place] 5/21 >> negative MRI brain 5/24 >> ?thrombosed cortical vein, otherwise normal  CULTURES: Bcx 5/19 X 2 >> negative  ANTIBIOTICS: Clinidamycin 5/19 >> 5/21 Vanco 5/19 >> Zosyn 5/19 >> 5/22 Meropenem 5/22  >>  SIGNIFICANT EVENTS: 5/19 Admit, cardiac arrest, transfer to ICU; hypothermia protocol started 5/24 off pressors.  MRI w/out evidence of ischemia   LINES/TUBES: ETT 5/19 >> Lt IJ CVL 5/19 >>  ASSESSMENT / PLAN:  Acute respiratory failure after cardiac arrest with pulmonary edema. - cont to assess for wean , mental status is major barrier for extubation.  - f/u CXR   Asystolic cardiac arrest. 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 Hx of HLD. - hold coreg, isosorbide - hold lipitor in setting of elevated LFTs  ESRD. Hypokalemia. - f/u BMET - HD per renal  Jaundice from shock liver with elevated LFTs. - f/u LFTs  Moderate protein calorie malnutrition. - tube feeds  Anemia of critical illness and chronic disease. - f/u CBC - aranesp  Lt foot osteomyelitis. - continue Abx  DM type II.>BS tr up  - SSI change to mod scale    Acute metabolic encephalopathy. Anoxic encephalopathy after cardiac arrest. Hx of DM neuropathy. - monitor mental status - hold outpt cymbalta, lyrica  Resolved problems >> hypotension from sedation  DVT prophylaxis - SQ heparin SUP - protonix Nutrition - tube feeds Goals of care - full code  Updated pt's mother at bedside 5/27    Tammy Parrett NP-C  [place] [facility]  [phone]   02/22/2017

## 2017-02-22 NOTE — Progress Notes (Signed)
[facility] Progress Note    Subjective:   Appears HD was pushed to today d/t emergencies Should be done today Last HD was 5/25 and able to get off 5 liters of fluid AS of yesterday still 5+ kg over EDW/VERY EDEMATOUS  Febrile to as high as 101 (on meropenem; vanco was stopped 5/25) No change in level of responsiveness  Vitals:   02/22/17 0200 02/22/17 0300 02/22/17 0400 02/22/17 0427  BP: (!) 141/80 (!) 155/98    Pulse: (!) 110 (!) 125 (!) 115   Resp: (!) 21 (!) 21 (!) 21   Temp:  (!) 100.6 F (38.1 C)    TempSrc:  Axillary    SpO2: 100% 98% 100%   Weight:    119.7 kg (263 lb 14.3 oz)  Height:       Exam: (Consistent sedation off since 8AM 5/21; has had couple doses of fentanyl) L IJ CVL VS as noted Weight 119.7 kg Orally intubated/OG tube  Shaking head, biting on tub but does not open eyes for me or follow commands Scleral icterus No jvd Lungs coarse BS Regular rhythm tachy 110  S1S2S3 L foot bandaged R foot metatarsal amp Chronic woody changes both LE's  Pitting edema of thighs and LE's unchanged Withdraws and grimaces when LLE examined LUA AVF +bruit  Inpatient medications: . calcium carbonate (dosed in mg elemental calcium)  2,000 mg of elemental calcium Oral TID  . chlorhexidine gluconate (MEDLINE KIT)  15 mL Mouth Rinse BID  . Chlorhexidine Gluconate Cloth  6 each Topical Q0600  . darbepoetin (ARANESP) injection - DIALYSIS  100 mcg Intravenous Q Tue-HD  . feeding supplement (NEPRO CARB STEADY)  1,000 mL Per Tube Q24H  . feeding supplement (PRO-STAT SUGAR FREE 64)  60 mL Per Tube QID  . [name]'s butt cream   Topical BID  . heparin  5,000 Units Subcutaneous Q8H  . insulin aspart  0-9 Units Subcutaneous Q4H  . mouth rinse  15 mL Mouth Rinse 10 times per day  . pantoprazole sodium  40 mg Per Tube Q24H  . sodium chloride flush  10-40 mL Intracatheter Q12H   . sodium chloride 10 mL/hr at 02/20/17 0800  . meropenem (MERREM) IV Stopped  (02/21/17 1821)   albuterol, fentaNYL (SUBLIMAZE) injection, sodium chloride flush  CXR - no active disease 5/19 am CXR 5/21 Stable CM ECHO 5/20 30-35%, Grade 2  CXR 5/22 mild vasc congestion CXR 5/23 worsening vascular congestion CXR 5/24 decreased pulmonary edema MRI 02/18/17 Focus of susceptibility in the left insula may indicate a thrombosed cortical vein. The location does not correspond to a large artery. 2. No associated ischemia or hemorrhage. Otherwise normal brain CXR 5/27 Huge heart, interstitial edema. ? Retrocardiac opacity   Recent Labs Lab 02/18/17 0300  02/20/17 0415 02/21/17 0423 02/22/17 0430  NA 137  < > 135 137 136  K 4.8  < > 3.4* 3.9 5.3*  CL 96*  < > 96* 98* 98*  CO2 26  < > 29 28 27   GLUCOSE 98  < > 189* 190* 195*  BUN 43*  < > 60* 51* 70*  CREATININE 3.37*  < > 4.23* 3.35* 4.27*  CALCIUM 7.5*  < > 8.8* 9.0 9.2  PHOS 6.1*  --   --  2.6 2.7  < > = values in this interval not displayed.  Recent Labs Lab 02/20/17 0415 02/21/17 0423 02/22/17 0430  AST 17 34 39  ALT 20 21 19   ALKPHOS 459* 516*  541*  BILITOT 10.8* 10.6* 8.3*  PROT 5.1* 5.5* 5.6*  ALBUMIN 1.5* 1.5*  1.6* 1.6*  1.7*    Recent Labs Lab 02/20/17 0415 02/21/17 0423 02/22/17 0430  WBC 20.5* 18.8* 18.9*  HGB 7.2* 9.0* 9.1*  HCT 22.6* 28.0* 28.8*  MCV 93.0 92.7 93.2  PLT 169 222 312    Outpt Dialysis:  TTS [facility] 4h 57mn    114kg    2/2 bath  LUA AVF - venofer 50/wk - mircera 75 every 2 wks, last 5/10 - calcitriol 0.75 ug tiw  Summary: 36year old female w/ ESRD 22/DM, HD bilateral TMA's, obesity, HTN, medical non-compliance, NICM. Admitted 5/19 w/left leg pain, + radiological evidence of osteomyelitis w/wound dehiscence. Admitted for IV antibiotics, possible surgery. Found unresponsive in the room on 5/19. Rec'd chest compressions, epi bicarb; intubated; cooled. We are providing HD.    Assessment: 1. Asystolic in hospital cardiac arrest (5/19).  CPR in house. Small  decline EF (to 30-35%) w/Gr 2 DD. Unclear etiology for arrest. Time down not clear. S/p cooling protocol. 2. AMS/anoxic brain injury - Minimal  improvement since initial event. MRI not impressive. EEG non specific diffuse slowing. Unclear what degree of recovery to expect. Need neuro to reassess 3. L foot osteo - s/p revision L TMA 4/25, dehisced on adm. Meropenem (vanco stopped 5/25) No imminent surgical plans.  WBC rising.  4. Fever, leukocytosis - possible sources foot, pulmonary. Blood cultures. CCM managing ATB's.  5. ESRD TTS HD. HD 5/22, 5/24, 5/25 (levophed prn for BP support)  HD was pushed from 5/26 to today. Try again for 4-5 liter goal, levophed prn during HD. (Once we get closer to EDW should not be needed). Try to get back to TTS schedule after today.  6. Acute resp failure; on vent. CCM 7. CKD-MBD - current binder CaCO3 down tube. Once back on usual TTS add back calcitriol 8. Anemia - Last outpt ESA  Mircera 75 mcg (5/10). Darbe 100/wk here. Last  given 5/22. PRBC's X 1 on 5/23, X2 on 5/25.  9. NICM EF 35-40% 10. Shock liver - (presumed). Persistent elevation. 11. DM2   CJamal Maes MD [phone] 02/22/2017, 8:05 AM

## 2017-02-23 LAB — GLUCOSE, CAPILLARY
GLUCOSE-CAPILLARY: 202 mg/dL — AB (ref 65–99)
Glucose-Capillary: 151 mg/dL — ABNORMAL HIGH (ref 65–99)
Glucose-Capillary: 154 mg/dL — ABNORMAL HIGH (ref 65–99)
Glucose-Capillary: 173 mg/dL — ABNORMAL HIGH (ref 65–99)
Glucose-Capillary: 194 mg/dL — ABNORMAL HIGH (ref 65–99)
Glucose-Capillary: 203 mg/dL — ABNORMAL HIGH (ref 65–99)

## 2017-02-23 LAB — COMPREHENSIVE METABOLIC PANEL
ALBUMIN: 1.9 g/dL — AB (ref 3.5–5.0)
ALT: 20 U/L (ref 14–54)
AST: 35 U/L (ref 15–41)
Alkaline Phosphatase: 574 U/L — ABNORMAL HIGH (ref 38–126)
Anion gap: 11 (ref 5–15)
BUN: 48 mg/dL — AB (ref 6–20)
CHLORIDE: 97 mmol/L — AB (ref 101–111)
CO2: 29 mmol/L (ref 22–32)
CREATININE: 3.34 mg/dL — AB (ref 0.44–1.00)
Calcium: 9 mg/dL (ref 8.9–10.3)
GFR calc Af Amer: 19 mL/min — ABNORMAL LOW (ref >60)
GFR calc non Af Amer: 17 mL/min — ABNORMAL LOW (ref >60)
Glucose, Bld: 193 mg/dL — ABNORMAL HIGH (ref 65–99)
Potassium: 3.9 mmol/L (ref 3.5–5.1)
SODIUM: 137 mmol/L (ref 135–145)
Total Bilirubin: 6.5 mg/dL — ABNORMAL HIGH (ref 0.3–1.2)
Total Protein: 5.7 g/dL — ABNORMAL LOW (ref 6.5–8.1)

## 2017-02-23 MED ORDER — CARVEDILOL 6.25 MG PO TABS
6.2500 mg | ORAL_TABLET | Freq: Two times a day (BID) | ORAL | Status: DC
  Administered 2017-02-23 – 2017-02-25 (×5): 6.25 mg
  Filled 2017-02-23 (×6): qty 1

## 2017-02-23 NOTE — Progress Notes (Signed)
Pharmacy Antibiotic Note  [name] is a 36 y.o. female admitted on 02/14/2017 with diarrhea, chills and LLE drainage 2/2 osteomyelitis. She is s/p revision of L TMA 4/25, now with wound dehiscence.  Pharmacy has been consulted to dose meropenem. Patient is ESRD on HD TTS but has gotten dialyzed off schedule. Cultures remain negative.   Patient's WBC is decreasing and Tmax/24h 102.2.   Plan: - Continue meropenem 500mg  IV every 24 hours at 1800 - Planned length of antibiotic therapy? - Monitor HD tolerance  Height: 5' 8.5" (174 cm) Weight: 247 lb 9.2 oz (112.3 kg) IBW/kg (Calculated) : 65.05  Temp (24hrs), Avg:99.5 F (37.5 C), Min:98.3 F (36.8 C), Max:102.2 F (39 C)   Recent Labs Lab 02/18/17 0802 02/19/17 0355 02/20/17 0415 02/20/17 0812 02/21/17 0423 02/22/17 0430 02/23/17 0325  WBC 30.3* 24.3* 20.5*  --  18.8* 18.9*  --   CREATININE  --  3.19* 4.23*  --  3.35* 4.27* 3.34*  VANCORANDOM  --   --   --  23  --   --   --     Estimated Creatinine Clearance: 30.6 mL/min (A) (by C-G formula based on SCr of 3.34 mg/dL (H)).    Allergies  Allergen Reactions  . Aspirin Anaphylaxis  . Sulfur Hives  . Tramadol Hives and Other (See Comments)    Pt states she feels weird   . Gabapentin Other (See Comments)    Lethargic     Antimicrobials this admission: Vanc 5/19>> 5/25 Zosyn 5/19>> 5/22 Clindamycin 5/19>> 5/21 Meropenem 5/22 >>  Levels: 5/25 pre-HD VR: 23 (on 1gm w/ HD)  Microbiology results: 5/19 BCx: neg 5/19:  MRSA PCR: neg  Tad Moore, BCPS  Clinical Pharmacist Pager [phone]  02/23/2017 9:39 AM

## 2017-02-23 NOTE — Progress Notes (Signed)
Assessment: 1. Asystolic in hospital cardiac arrest (5/19).  CPR in house. Small decline EF (to 30-35%) w/Gr 2 DD. Unclear etiology for arrest. Time down not clear. S/p cooling protocol. 2. AMS/anoxic brain injury -  MRI not impressive. EEG non specific diffuse slowing. Unclear degree of recovery to expect 3. L foot osteo - s/p  L TMA 4/25, dehisced on adm. Meropenem (vanco stopped 5/25) No imminent surgical plans.    4. ESRD TTS HD. HD 5/22, 5/24, 5/25 (levophed prn for BP support)  HD was pushed from 5/26 to 2/27. Try again for 4-5 liter goal, levophed prn during HD. (Once we get closer to EDW should not be needed). Try to get back to TTS. 5. Acute resp failure; on vent. CCM 6. CKD-MBD - current binder CaCO3 down tube. Once back on usual TTS add back calcitriol  7. Anemia - Mircera 75 mcg (5/10). Darbe 100/wk here. Last  given 5/22. PRBC's X 1 on 5/23, X2 on 5/25.  8. NICM EF 35-40% 9. Shock liver - (presumed). Persistent elevation.    Objective: Vital signs in last 24 hours: Temp:  [98.2 F (36.8 [name])-102.2 F (39 [name])] 98.2 F (36.8 [name]) (05/28 1100) Pulse Rate:  [93-111] 109 (05/28 1137) Resp:  [13-33] 26 (05/28 1137) BP: (111-181)/(55-90) 137/70 (05/28 1137) SpO2:  [98 %-100 %] 100 % (05/28 1137) FiO2 (%):  [30 %] 30 % (05/28 1137) Weight:  [112.3 kg (247 lb 9.2 oz)] 112.3 kg (247 lb 9.2 oz) (05/28 0400) Weight change: 0 kg (0 lb)  Intake/Output from previous day: 05/27 0701 - 05/28 0700 In: 1270 [I.V.:240; NG/GT:930; IV Piggyback:100] Out: 5000  Intake/Output this shift: Total I/O In: 270 [I.V.:10; NG/GT:260] Out: 100 [Stool:100]  General appearance: not res[ponsive Resp: clear to auscultation bilaterally Cardio: regular rate and rhythm, S1, S2 normal, no murmur, click, rub or gallop Extremities: edema 2+ LE  Moves head  Lab Results:  Recent Labs  02/21/17 0423 02/22/17 0430  WBC 18.8* 18.9*  HGB 9.0* 9.1*  HCT 28.0* 28.8*  PLT 222 312   BMET:  Recent Labs  02/22/17 0430 02/23/17 0325  NA 136 137  K 5.3* 3.9  CL 98* 97*  CO2 27 29  GLUCOSE 195* 193*  BUN 70* 48*  CREATININE 4.27* 3.34*  CALCIUM 9.2 9.0   No results for input(s): PTH in the last 72 hours. Iron Studies: No results for input(s): IRON, TIBC, TRANSFERRIN, FERRITIN in the last 72 hours. Studies/Results: Dg Chest Port 1 View  Result Date: 02/22/2017 CLINICAL DATA:  Respiratory failure EXAM: PORTABLE CHEST 1 VIEW COMPARISON:  02/21/2017 FINDINGS: Endotracheal tube terminates 5 cm above the carina. Cardiomegaly with mild interstitial edema. Retrocardiac opacity, possibly reflecting a combination of atelectasis and pleural effusion, pneumonia not excluded. No pneumothorax. Left IJ venous catheter terminates at the cavoatrial junction. Enteric tube courses into the stomach. IMPRESSION: Endotracheal tube terminates 5 cm above the carina. Cardiomegaly with mild interstitial edema. Retrocardiac opacity, possibly atelectasis and pleural effusion, pneumonia not excluded. Electronically Signed   By: Julian Hy M.D.   On: 02/22/2017 07:30    Scheduled: . calcium carbonate (dosed in mg elemental calcium)  2,000 mg of elemental calcium Oral TID  . carvedilol  6.25 mg Per Tube BID WC  . chlorhexidine gluconate (MEDLINE KIT)  15 mL Mouth Rinse BID  . Chlorhexidine Gluconate Cloth  6 each Topical Q0600  . darbepoetin (ARANESP) injection - DIALYSIS  100 mcg Intravenous Q Tue-HD  . feeding supplement (NEPRO CARB STEADY)  1,000 mL Per Tube Q24H  . feeding supplement (PRO-STAT SUGAR FREE 64)  60 mL Per Tube QID  . [name]'s butt cream   Topical BID  . heparin  5,000 Units Subcutaneous Q8H  . insulin aspart  0-15 Units Subcutaneous Q4H  . mouth rinse  15 mL Mouth Rinse 10 times per day  . pantoprazole sodium  40 mg Per Tube Q24H  . sodium chloride flush  10-40 mL Intracatheter Q12H     LOS: 9 days   [name] 02/23/2017,1:17 PM

## 2017-02-23 NOTE — Progress Notes (Signed)
PULMONARY / CRITICAL CARE MEDICINE   Name: [name] MRN: [id number] DOB: 1980/12/11    ADMISSION DATE:  02/14/2017 CONSULTATION DATE:  02/14/2017  REFERRING MD:  Feliz Beam MD  CHIEF COMPLAINT:  Sepsis, cardiac arrest  HISTORY OF PRESENT ILLNESS:   36 year old female w/ ESRD d/t poorly controlled DM, bilateral transmetatarsal foot amputations, obesity, HTN, medical non-compliance as well as NICM. Admitted 5/19 with complaints of left leg pain, found to have radiological evidence of osteomyelitis with wound nonhealing, dehiscence, purulent drainage from the wound. Admitted for IV antibiotics, hemodialysis and possible surgery.  Found unresponsive in the room on 5/19. She had chest compressions, epi bicarb given. She was intubated by anesthesia and had ROSC in approximately 9 minutes. Downtime prior to code is unknown. She got dilaudid in the afternoon for leg pain.  SUBJECTIVE:  Mother reports that she is opening eyes and looking around more.  Tolerating pressure support 15/5.  VITAL SIGNS: BP 139/73   Pulse (!) 103   Temp 98.8 F (37.1 C) (Oral)   Resp 19   Ht 5' 8.5" (1.74 m)   Wt 247 lb 9.2 oz (112.3 kg)   LMP  (LMP Unknown)   SpO2 98%   BMI 37.10 kg/m   VENTILATOR SETTINGS: Vent Mode: PRVC FiO2 (%):  [30 %] 30 % Set Rate:  [18 bmp] 18 bmp Vt Set:  [520 mL] 520 mL PEEP:  [5 cmH20] 5 cmH20 Plateau Pressure:  [17 cmH20-27 cmH20] 22 cmH20  INTAKE / OUTPUT: I/O last 3 completed shifts: In: 1720 [I.V.:360; NG/GT:1260; IV Piggyback:100] Out: 5050 [Other:5000; Stool:50]  General - on vent ENT - ETT in place Cardiac - regular, no murmur Chest - no wheeze, rales Abd - soft, non tender Ext - 1+ edema Skin - no rashes Neuro - normal strength Psych - normal mood   LABS:  BMET  Recent Labs Lab 02/21/17 0423 02/22/17 0430 02/23/17 0325  NA 137 136 137  K 3.9 5.3* 3.9  CL 98* 98* 97*  CO2 28 27 29   BUN 51* 70* 48*  CREATININE 3.35* 4.27* 3.34*  GLUCOSE 190*  195* 193*    Electrolytes  Recent Labs Lab 02/17/17 0345 02/17/17 1739 02/18/17 0300  02/21/17 0423 02/22/17 0430 02/23/17 0325  CALCIUM 7.0*  --  7.5*  < > 9.0 9.2 9.0  MG 2.1 2.0 1.9  --   --   --   --   PHOS 9.5* 5.0* 6.1*  --  2.6 2.7  --   < > = values in this interval not displayed.  CBC  Recent Labs Lab 02/20/17 0415 02/21/17 0423 02/22/17 0430  WBC 20.5* 18.8* 18.9*  HGB 7.2* 9.0* 9.1*  HCT 22.6* 28.0* 28.8*  PLT 169 222 312    Coag's  Recent Labs Lab 02/18/17 1134 02/21/17 0423  APTT 42*  --   INR 1.38 1.27    Sepsis Markers No results for input(s): LATICACIDVEN, PROCALCITON, O2SATVEN in the last 168 hours.  ABG  Recent Labs Lab 02/17/17 0416  PHART 7.352  PCO2ART 42.6  PO2ART 148*    Liver Enzymes  Recent Labs Lab 02/21/17 0423 02/22/17 0430 02/23/17 0325  AST 34 39 35  ALT 21 19 20   ALKPHOS 516* 541* 574*  BILITOT 10.6* 8.3* 6.5*  ALBUMIN 1.5*  1.6* 1.6*  1.7* 1.9*    Cardiac Enzymes No results for input(s): TROPONINI, PROBNP in the last 168 hours.  Glucose  Recent Labs Lab 02/22/17 1158 02/22/17 2010 02/22/17  2355 02/23/17 0326 02/23/17 0407 02/23/17 0738  GLUCAP 198* 163* 154* 194* 173* 202*   STUDIES:  Lower extremity ultrasound 5/18 >> no evidence of DVT CT head 5/19 >> normal exam Echo 5/20 >> EF 30-35%, increased LVF. Diffuse hypokinesis, akinesis of basilar mid-inferior myocardium, grade 2 diastolic dysfxn  EEG 5/20 >> finding c/w mod to severe global cerebral dysfxn. C/w anoxic injury given clinical course  LE [place] 5/21 >> negative MRI brain 5/24 >> ?thrombosed cortical vein, otherwise normal  CULTURES: Bcx 5/19 X 2 >> negative  ANTIBIOTICS: Clinidamycin 5/19 >> 5/21 Vancomycin 5/19 >> Zosyn 5/19 >> 5/22 Meropenem 5/22 >>  SIGNIFICANT EVENTS: 5/19 Admit, cardiac arrest, transfer to ICU; hypothermia protocol started 5/24 off pressors.  MRI w/out evidence of ischemia   LINES/TUBES: ETT 5/19  >> Lt IJ CVL 5/19 >>  ASSESSMENT / PLAN:  Acute respiratory failure after cardiac arrest with pulmonary edema. - pressure support wean as able - mental status barrier to extubation trial - mother considering tracheostomy - f/u CXR intermittently  Asystolic cardiac arrest. 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 Hx of HLD. - restart coreg - hold isosorbide - hold lipitor in setting of elevated LFTs  ESRD. Hypokalemia. - HD per renal  Jaundice from shock liver with elevated LFTs. - f/u LFTs intermittently  Moderate protein calorie malnutrition. - tube feeds  Anemia of critical illness and chronic disease. - f/u CBC - aranesp  Lt foot osteomyelitis. - continue Abx  DM type II. - SSI  Acute metabolic encephalopathy. Anoxic encephalopathy after cardiac arrest. Hx of DM neuropathy. - monitor mental status - hold outpt cymbalta, lyrica  Resolved problems >> hypotension from sedation  DVT prophylaxis - SQ heparin SUP - protonix Nutrition - tube feeds Goals of care - full code  Updated pt's mother at bedside  Coralyn Helling, MD [facility] Pulmonary/Critical Care 02/23/2017, 9:00 AM Pager:  [phone] After 3pm call: [phone]

## 2017-02-24 ENCOUNTER — Inpatient Hospital Stay (HOSPITAL_COMMUNITY): Payer: Medicaid Other

## 2017-02-24 LAB — RENAL FUNCTION PANEL
ALBUMIN: 1.8 g/dL — AB (ref 3.5–5.0)
Albumin: 1.8 g/dL — ABNORMAL LOW (ref 3.5–5.0)
Anion gap: 11 (ref 5–15)
Anion gap: 12 (ref 5–15)
BUN: 85 mg/dL — ABNORMAL HIGH (ref 6–20)
BUN: 87 mg/dL — AB (ref 6–20)
CALCIUM: 9.4 mg/dL (ref 8.9–10.3)
CHLORIDE: 97 mmol/L — AB (ref 101–111)
CO2: 28 mmol/L (ref 22–32)
CO2: 27 mmol/L (ref 22–32)
CREATININE: 4.51 mg/dL — AB (ref 0.44–1.00)
CREATININE: 4.65 mg/dL — AB (ref 0.44–1.00)
Calcium: 9.2 mg/dL (ref 8.9–10.3)
Chloride: 97 mmol/L — ABNORMAL LOW (ref 101–111)
GFR, EST AFRICAN AMERICAN: 13 mL/min — AB (ref >60)
GFR, EST AFRICAN AMERICAN: 13 mL/min — AB (ref >60)
GFR, EST NON AFRICAN AMERICAN: 11 mL/min — AB (ref >60)
GFR, EST NON AFRICAN AMERICAN: 11 mL/min — AB (ref >60)
Glucose, Bld: 164 mg/dL — ABNORMAL HIGH (ref 65–99)
Glucose, Bld: 176 mg/dL — ABNORMAL HIGH (ref 65–99)
PHOSPHORUS: 2.8 mg/dL (ref 2.5–4.6)
POTASSIUM: 4 mmol/L (ref 3.5–5.1)
Phosphorus: 2.6 mg/dL (ref 2.5–4.6)
Potassium: 3.8 mmol/L (ref 3.5–5.1)
SODIUM: 136 mmol/L (ref 135–145)
Sodium: 136 mmol/L (ref 135–145)

## 2017-02-24 LAB — CBC
HCT: 28.5 % — ABNORMAL LOW (ref 36.0–46.0)
HEMATOCRIT: 28 % — AB (ref 36.0–46.0)
HEMOGLOBIN: 8.6 g/dL — AB (ref 12.0–15.0)
Hemoglobin: 8.6 g/dL — ABNORMAL LOW (ref 12.0–15.0)
MCH: 29.4 pg (ref 26.0–34.0)
MCH: 29.2 pg (ref 26.0–34.0)
MCHC: 30.7 g/dL (ref 30.0–36.0)
MCHC: 30.2 g/dL (ref 30.0–36.0)
MCV: 95.6 fL (ref 78.0–100.0)
MCV: 96.6 fL (ref 78.0–100.0)
PLATELETS: 424 10*3/uL — AB (ref 150–400)
PLATELETS: 436 10*3/uL — AB (ref 150–400)
RBC: 2.93 MIL/uL — AB (ref 3.87–5.11)
RBC: 2.95 MIL/uL — AB (ref 3.87–5.11)
RDW: 17.1 % — ABNORMAL HIGH (ref 11.5–15.5)
RDW: 17.3 % — AB (ref 11.5–15.5)
WBC: 20.9 10*3/uL — AB (ref 4.0–10.5)
WBC: 20.6 10*3/uL — AB (ref 4.0–10.5)

## 2017-02-24 LAB — GLUCOSE, CAPILLARY
GLUCOSE-CAPILLARY: 147 mg/dL — AB (ref 65–99)
GLUCOSE-CAPILLARY: 151 mg/dL — AB (ref 65–99)
GLUCOSE-CAPILLARY: 167 mg/dL — AB (ref 65–99)
GLUCOSE-CAPILLARY: 163 mg/dL — AB (ref 65–99)
Glucose-Capillary: 126 mg/dL — ABNORMAL HIGH (ref 65–99)
Glucose-Capillary: 178 mg/dL — ABNORMAL HIGH (ref 65–99)
Glucose-Capillary: 221 mg/dL — ABNORMAL HIGH (ref 65–99)
Glucose-Capillary: 97 mg/dL (ref 65–99)

## 2017-02-24 MED ORDER — DARBEPOETIN ALFA 100 MCG/0.5ML IJ SOSY
PREFILLED_SYRINGE | INTRAMUSCULAR | Status: AC
  Administered 2017-02-24: 100 ug via INTRAVENOUS
  Filled 2017-02-24: qty 0.5

## 2017-02-24 MED ORDER — HEPARIN SODIUM (PORCINE) 1000 UNIT/ML DIALYSIS
20.0000 [IU]/kg | INTRAMUSCULAR | Status: DC | PRN
  Administered 2017-02-24: 2200 [IU] via INTRAVENOUS_CENTRAL
  Filled 2017-02-24: qty 3

## 2017-02-24 MED ORDER — LIDOCAINE HCL (PF) 1 % IJ SOLN: 5.0000 mL | INTRAMUSCULAR | Status: DC | PRN

## 2017-02-24 MED ORDER — ALTEPLASE 2 MG IJ SOLR
2.0000 mg | Freq: Once | INTRAMUSCULAR | Status: DC | PRN
  Filled 2017-02-24: qty 2

## 2017-02-24 MED ORDER — PENTAFLUOROPROP-TETRAFLUOROETH EX AERO: 1.0000 "application " | INHALATION_SPRAY | CUTANEOUS | Status: DC | PRN

## 2017-02-24 MED ORDER — HEPARIN SODIUM (PORCINE) 1000 UNIT/ML DIALYSIS: 1000.0000 [IU] | INTRAMUSCULAR | Status: DC | PRN

## 2017-02-24 MED ORDER — SODIUM CHLORIDE 0.9 % IV SOLN: 100.0000 mL | INTRAVENOUS | Status: DC | PRN

## 2017-02-24 MED ORDER — LIDOCAINE-PRILOCAINE 2.5-2.5 % EX CREA: 1.0000 "application " | TOPICAL_CREAM | CUTANEOUS | Status: DC | PRN

## 2017-02-24 NOTE — Progress Notes (Signed)
Dialysis treatment completed.  5000 mL ultrafiltrated and net fluid removal 4500 mL.    Patient status unchnaged. Lung sounds diminished to ausculation in all fields. Generalized edema. Cardiac: ST to NSR.  Disconnected lines and removed needles.  Pressure held for 10 minutes and band aid/gauze dressing applied.  Report given to bedside RN, Maralyn Sago.

## 2017-02-24 NOTE — Progress Notes (Signed)
Arrived to patient room 2H-20 at 1138.  Reviewed treatment plan and this RN agrees.  Report received from bedside RN, Maralyn Sago.  Consent verified.  Patient AMS, responds to pain. Lung sounds with expiratory wheezes to ausculation in all fields. Generalized +1 pitting edema. Cardiac: ST.  Prepped LUAVF with alcohol and cannulated with two 16 gauge needles.  Pulsation of blood noted.  Flushed access well with saline per protocol.  Connected and secured lines and initiated tx at 1153.  UF goal of 5000 mL and net fluid removal of 4500 mL.  Will continue to monitor.

## 2017-02-24 NOTE — Consult Note (Signed)
NEURO HOSPITALIST CONSULT NOTE   Requestig physician: Dr. Vaughan Browner   Reason for Consult: prognostication   History obtained from: Chart    HPI:                                                                                                                                          [name] is an 36 y.o. female with a history of ESRD on HD T TH S (through a LUE AVF), COPD/ Asthma , CAD s/p MI 04/2015, HLD,  HTN, CHF, chronic anemia, DM,  and a history of L foot partial amputation by Dr., Sharol Given in 09/2016 with revision in 12/2016 for dehiscence, presenting to the ED with worsening LLE pain, swelling, increased drainage at the stump, and chills. No fever. She had been seen by Ortho this week and placed on Clindamycin. At the time, she had a LE [place] neg for DVT. PAin is not well controled. Orthopedics started IV antibiotics for possible osteomyelitis. 5/19/ she had a code blue with unknown down time prior to code blue called. ROSC in ~9 minutes. EEG obtained 5.2.2018 without epileptiform activity. Was taken off sedation since 5/21 with pupils, corneals, gag and some motor movement. Second EEG obtained on 5/24 and showed diffuse slowing without seizure activity.   Patient was rewarmed from cooling protocol on 5/20 thus she is now 8 days out of being rewarmed.   She has been off propofol since 5/21.   Past Medical History:  Diagnosis Date  . Anemia   . Arthritis    "left hand" (09/15/2013)  . Asthma   . CHF (congestive heart failure) ([place])   . Chronic bronchitis ([place])    "just about q yr" (09/15/2013)  . Chronic kidney disease    "low kidney function" (09/15/2013) ,  T[place]  . COPD (chronic obstructive pulmonary disease) ([place])   . Coronary artery disease   . Hyperlipidemia   . Hypertension   . Migraine    "get them alot" (09/15/2013)  . Myocardial infarction [facility]) 04/2015   NSTEMI  . Normal coronary arteries    by cardiac catheterization 09/20/13  . Peripheral vascular  disease ([place])   . Pneumonia    "couple times; have it now" (09/15/2013)  . PONV (postoperative nausea and vomiting)   . Restless legs   . Shortness of breath    "just recently; related to the pneumonia" (09/15/2013)  . Type 1 diabetes mellitus ([place])    type 2    Past Surgical History:  Procedure Laterality Date  . AMPUTATION Right 06/25/2016   Procedure: Amputation Right Great Toe at the Metatarsophalangeal Joint;  Surgeon: Newt Minion, MD;  Location: [place];  Service: Orthopedics;  Laterality: Right;  . AMPUTATION Bilateral 10/08/2016   Procedure: Bilateral Transmetatarsal Amputation;  Surgeon: Newt Minion, MD;  Location: [place];  Service: Orthopedics;  Laterality: Bilateral;  . AV FISTULA PLACEMENT Left 03/27/2015   Procedure: CREATION RADIAL CEPHALIC ARTERIOVENOUS FISTULA;  Surgeon: Angelia Mould, MD;  Location: [place];  Service: Vascular;  Laterality: Left;  . AV FISTULA PLACEMENT Left 11/23/2015   Procedure:  LEFT ARM BASILIC VEIN TRANSPOSITION  ;  Surgeon: Angelia Mould, MD;  Location: [place];  Service: Vascular;  Laterality: Left;  . CESAREAN SECTION  1999; 2006  . CORONARY ANGIOGRAM  09/20/2013   Procedure: CORONARY ANGIOGRAM;  Surgeon: Lorretta Harp, MD;  Location[facility] C[facility] LAB;  Service: Cardiovascular;;  . FINGER SURGERY Left 1985   3rd and 4th digits reconstructed after cut off" (09/15/2013)  . PERIPHERAL VASCULAR CATHETERIZATION N/A 09/05/2016   Procedure: Abdominal Aortogram;  Surgeon: Elam Dutch, MD;  Location: [place] CV LAB;  Service: Cardiovascular;  Laterality: N/A;  . PERIPHERAL VASCULAR CATHETERIZATION Bilateral 09/05/2016   Procedure: Lower Extremity Angiography;  Surgeon: Elam Dutch, MD;  Location: [place] CV LAB;  Service: Cardiovascular;  Laterality: Bilateral;  . PERIPHERAL VASCULAR CATHETERIZATION Right 09/05/2016   Procedure: Peripheral Vascular Balloon Angioplasty;  Surgeon: Elam Dutch, MD;  Location: [place] CV LAB;   Service: Cardiovascular;  Laterality: Right;  peroneal and AT  . SHOULDER ARTHROSCOPY WITH BICEPSTENOTOMY Right 05/10/2015   Procedure: RIGHT SHOULDER ARTHROSCOPY WITH BICEPS TENOTOMY, DEBRIDEMENT LABRAL TEAR;  Surgeon: Tania Ade, MD;  Location: [place];  Service: Orthopedics;  Laterality: Right;  Right shoulder arthroscopy biceps tenotomy, debridement labral tear  . STUMP REVISION Left 01/21/2017   Procedure: . Revision Left Transmetatarsal Amputation;  Surgeon: Newt Minion, MD;  Location: [place];  Service: Orthopedics;  Laterality: Left;  . TONSILLECTOMY  1997  . TUBAL LIGATION  2006    Family History  Problem Relation Age of Onset  . Diabetes Mother   . Stroke Mother   . Hypertension Father   . Hyperlipidemia Father   . Cancer - Lung Father   . Diabetes Maternal Grandmother   . Cancer Paternal Grandmother       Social History:  reports that she has never smoked. She has never used smokeless tobacco. She reports that she does not drink alcohol or use drugs.  Allergies  Allergen Reactions  . Aspirin Anaphylaxis  . Sulfur Hives  . Tramadol Hives and Other (See Comments)    Pt states she feels weird   . Gabapentin Other (See Comments)    Lethargic     MEDICATIONS:                                                                                                                     Scheduled: . calcium carbonate (dosed in mg elemental calcium)  2,000 mg of elemental calcium Oral TID  . carvedilol  6.25 mg Per Tube BID WC  . chlorhexidine gluconate (MEDLINE KIT)  15 mL Mouth Rinse BID  . Chlorhexidine Gluconate Cloth  6  each Topical [id number]  . darbepoetin (ARANESP) injection - DIALYSIS  100 mcg Intravenous Q Tue-HD  . feeding supplement (NEPRO CARB STEADY)  1,000 mL Per Tube Q24H  . feeding supplement (PRO-STAT SUGAR FREE 64)  60 mL Per Tube QID  . [name]'s butt cream   Topical BID  . heparin  5,000 Units Subcutaneous Q8H  . insulin aspart  0-15 Units Subcutaneous Q4H  .  mouth rinse  15 mL Mouth Rinse 10 times per day  . pantoprazole sodium  40 mg Per Tube Q24H  . sodium chloride flush  10-40 mL Intracatheter Q12H     ROS:                                                                                                                                       History obtained from unobtainable from patient due to mental status    Blood pressure 122/66, pulse (!) 102, temperature (!) 100.6 F (38.1 C), temperature source Oral, resp. rate (!) 21, height 5' 8.5" (1.74 m), weight 114 kg (251 lb 5.2 oz), SpO2 100 %.   Neurologic Examination:                                                                                                      HEENT-  Normocephalic, no lesions, without obvious abnormality.  Normal external eye and conjunctiva.  Normal TM's bilaterally.  Normal auditory canals and external ears. Normal external nose, mucus membranes and septum.  Normal pharynx. Cardiovascular- S1, S2 normal, pulses palpable throughout   Lungs- chest clear, no wheezing, rales, normal symmetric air entry Abdomen- normal findings: bowel sounds normal Extremities- less then 2 second capillary refill Lymph-no adenopathy palpable Musculoskeletal-no joint tenderness, deformity or swelling Skin-warm and dry, no hyperpigmentation, vitiligo, or suspicious lesions  Neurological Examination Mental Status: Patient does not respond to verbal stimuli.  Does not respond to deep sternal rub.  Does not follow commands.  No verbalizations noted.  Cranial Nerves: II: patient does not respond confrontation bilaterally, pupils right 2 mm, left 2 mm,and reactive bilaterally III,IV,VI: doll's response present bilaterally.  V,VII: corneal reflex present bilaterally  VIII: patient does not respond to verbal stimuli IX,X: gag reflex present, XI: trapezius strength unable to test bilaterally XII: tongue strength unable to test Motor: Extremities flaccid throughout UE with triple reflex to  stimuli in bilateral LE.  No spontaneous movement noted.  No purposeful movements noted. Sensory: Does not respond to noxious stimuli  Other than triple reflex  Deep Tendon Reflexes:  1+ Plantars: Unable due to no toes Cerebellar: Unable to perform      Lab Results: Basic Metabolic Panel:  Recent Labs Lab 02/17/17 1739 02/18/17 0300  02/21/17 0423 02/22/17 0430 02/23/17 0325 02/24/17 0545 02/24/17 0904  NA  --  137  < > 137 136 137 136 136  K  --  4.8  < > 3.9 5.3* 3.9 4.0 3.8  CL  --  96*  < > 98* 98* 97* 97* 97*  CO2  --  26  < > 28 27 29 28 27   GLUCOSE  --  98  < > 190* 195* 193* 164* 176*  BUN  --  43*  < > 51* 70* 48* 85* 87*  CREATININE  --  3.37*  < > 3.35* 4.27* 3.34* 4.51* 4.65*  CALCIUM  --  7.5*  < > 9.0 9.2 9.0 9.2 9.4  MG 2.0 1.9  --   --   --   --   --   --   PHOS 5.0* 6.1*  --  2.6 2.7  --  2.6 2.8  < > = values in this interval not displayed.  Liver Function Tests:  Recent Labs Lab 02/19/17 0355 02/20/17 0415 02/21/17 0423 02/22/17 0430 02/23/17 0325 02/24/17 0545 02/24/17 0904  AST 15 17 34 39 35  --   --   ALT 22 20 21 19 20   --   --   ALKPHOS 382* 459* 516* 541* 574*  --   --   BILITOT 12.4* 10.8* 10.6* 8.3* 6.5*  --   --   PROT 5.0* 5.1* 5.5* 5.6* 5.7*  --   --   ALBUMIN 1.5* 1.5* 1.5*  1.6* 1.6*  1.7* 1.9* 1.8* 1.8*   No results for input(s): LIPASE, AMYLASE in the last 168 hours.  Recent Labs Lab 02/19/17 1000  AMMONIA 24    CBC:  Recent Labs Lab 02/20/17 0415 02/21/17 0423 02/22/17 0430 02/24/17 0540 02/24/17 0904  WBC 20.5* 18.8* 18.9* 20.9* 20.6*  HGB 7.2* 9.0* 9.1* 8.6* 8.6*  HCT 22.6* 28.0* 28.8* 28.0* 28.5*  MCV 93.0 92.7 93.2 95.6 96.6  PLT 169 222 312 424* 436*    Cardiac Enzymes: No results for input(s): CKTOTAL, CKMB, CKMBINDEX, TROPONINI in the last 168 hours.  Lipid Panel: No results for input(s): CHOL, TRIG, HDL, CHOLHDL, VLDL, LDLCALC in the last 168 hours.  CBG:  Recent Labs Lab  02/23/17 1540 02/23/17 1959 02/23/17 2330 02/24/17 0407 02/24/17 0817  GLUCAP 203* 178* 221* 151* 167*    Microbiology: Results for orders placed or performed during the hospital encounter of 02/14/17  Culture, blood (Routine X 2) w Reflex to ID Panel     Status: None   Collection Time: 02/14/17  8:08 AM  Result Value Ref Range Status   Specimen Description BLOOD RIGHT ANTECUBITAL  Final   Special Requests IN PEDIATRIC BOTTLE Blood Culture adequate volume  Final   Culture NO GROWTH 5 DAYS  Final   Report Status 02/19/2017 FINAL  Final  Culture, blood (Routine X 2) w Reflex to ID Panel     Status: None   Collection Time: 02/14/17  8:12 AM  Result Value Ref Range Status   Specimen Description BLOOD RIGHT HAND  Final   Special Requests IN PEDIATRIC BOTTLE Blood Culture adequate volume  Final   Culture NO GROWTH 5 DAYS  Final   Report Status 02/19/2017 FINAL  Final  MRSA PCR Screening  Status: None   Collection Time: 02/14/17  5:44 PM  Result Value Ref Range Status   MRSA by PCR NEGATIVE NEGATIVE Final    Comment:        The GeneXpert MRSA Assay (FDA approved for NASAL specimens only), is one component of a comprehensive MRSA colonization surveillance program. It is not intended to diagnose MRSA infection nor to guide or monitor treatment for MRSA infections.     Coagulation Studies: No results for input(s): LABPROT, INR in the last 72 hours.  Imaging: Dg Chest Port 1 View  Result Date: 02/24/2017 CLINICAL DATA:  Respiratory failure EXAM: PORTABLE CHEST 1 VIEW COMPARISON:  Two days ago FINDINGS: Endotracheal tube tip at the clavicular heads. An orogastric tube reaches the stomach. Esophageal thermistor. Left IJ central line with tip at the SVC level. Cardiomegaly and diffuse hazy opacity with cephalized blood flow. No definitive effusion. No pneumothorax. IMPRESSION: 1. Stable positioning of tubes and central line. 2. CHF pattern. Electronically Signed   By: Monte Fantasia M.D.   On: 02/24/2017 07:31   History and examination documented by Etta Quill PA-C, [facility], [phone] 02/24/2017, 12:23 PM   Assessment/Plan:  This is an unfortunate 36 year old female who had a 9+ minute asystole cardiac arrest, now status post cooling and rewarming protocol. Patient is now approximately 8 days past rewarming with no significant neurological improvement. On exam she does show brainstem reflexes such as gag, corneal reflex, pupillary reflex, doll's eyes. Patient shows little to no higher cortical function such as following commands, spontaneous movement or purposeful movement. Given the length of time patient has been off of sedation and out of cooling protocol prognosis for a meaningful neurological recovery over the long term is dismal. Palliative care consult is recommended. Discussed with family.   Please call Neurology if there are additional questions.   Electronically signed: Dr. Kerney Elbe

## 2017-02-24 NOTE — Progress Notes (Signed)
PULMONARY / CRITICAL CARE MEDICINE   Name: [name] MRN: [id number] DOB: 1981/06/30    ADMISSION DATE:  02/14/2017 CONSULTATION DATE:  02/14/2017  REFERRING MD:  Feliz Beam MD  CHIEF COMPLAINT:  Sepsis, cardiac arrest  HISTORY OF PRESENT ILLNESS:   36 year old female w/ ESRD d/t poorly controlled DM, bilateral transmetatarsal foot amputations, obesity, HTN, medical non-compliance as well as NICM. Admitted 5/19 with complaints of left leg pain, found to have radiological evidence of osteomyelitis with wound nonhealing, dehiscence, purulent drainage from the wound. Admitted for IV antibiotics, hemodialysis and possible surgery.  Found unresponsive in the room on 5/19. She had chest compressions, epi bicarb given. She was intubated by anesthesia and had ROSC in approximately 9 minutes. Downtime prior to code is unknown. She got dilaudid in the afternoon for leg pain.  SUBJECTIVE:  Febrile overnight.  VITAL SIGNS: BP 140/84   Pulse (!) 107   Temp 98.9 F (37.2 C) (Oral)   Resp 18   Ht 5' 8.5" (1.74 m)   Wt 251 lb 8.7 oz (114.1 kg)   LMP  (LMP Unknown)   SpO2 100%   BMI 37.69 kg/m   VENTILATOR SETTINGS: Vent Mode: PRVC FiO2 (%):  [30 %] 30 % Set Rate:  [18 bmp] 18 bmp Vt Set:  [520 mL] 520 mL PEEP:  [5 cmH20] 5 cmH20 Pressure Support:  [15 cmH20] 15 cmH20 Plateau Pressure:  [20 cmH20-23 cmH20] 20 cmH20  INTAKE / OUTPUT: I/O last 3 completed shifts: In: 1980 [I.V.:350; NG/GT:1530; IV Piggyback:100] Out: 200 [Stool:200]  General - on vent ENT - ETT in place Cardiac - regular, no murmur Chest - no wheeze, rales Abd - soft, non tender Ext - 1+ edema, left foot amputation, right metatarsal amputation Skin - no rashes Neuro - opens eyes spontaneously   LABS:  BMET  Recent Labs Lab 02/22/17 0430 02/23/17 0325 02/24/17 0545  NA 136 137 136  K 5.3* 3.9 4.0  CL 98* 97* 97*  CO2 27 29 28   BUN 70* 48* 85*  CREATININE 4.27* 3.34* 4.51*  GLUCOSE 195* 193* 164*     Electrolytes  Recent Labs Lab 02/17/17 1739 02/18/17 0300  02/21/17 0423 02/22/17 0430 02/23/17 0325 02/24/17 0545  CALCIUM  --  7.5*  < > 9.0 9.2 9.0 9.2  MG 2.0 1.9  --   --   --   --   --   PHOS 5.0* 6.1*  --  2.6 2.7  --  2.6  < > = values in this interval not displayed.  CBC  Recent Labs Lab 02/21/17 0423 02/22/17 0430 02/24/17 0540  WBC 18.8* 18.9* 20.9*  HGB 9.0* 9.1* 8.6*  HCT 28.0* 28.8* 28.0*  PLT 222 312 424*    Coag's  Recent Labs Lab 02/18/17 1134 02/21/17 0423  APTT 42*  --   INR 1.38 1.27    Sepsis Markers No results for input(s): LATICACIDVEN, PROCALCITON, O2SATVEN in the last 168 hours.  ABG No results for input(s): PHART, PCO2ART, PO2ART in the last 168 hours.  Liver Enzymes  Recent Labs Lab 02/21/17 0423 02/22/17 0430 02/23/17 0325 02/24/17 0545  AST 34 39 35  --   ALT 21 19 20   --   ALKPHOS 516* 541* 574*  --   BILITOT 10.6* 8.3* 6.5*  --   ALBUMIN 1.5*  1.6* 1.6*  1.7* 1.9* 1.8*    Cardiac Enzymes No results for input(s): TROPONINI, PROBNP in the last 168 hours.  Glucose  Recent  Labs Lab 02/23/17 0407 02/23/17 0738 02/23/17 1127 02/23/17 1540 02/23/17 2330 02/24/17 0407  GLUCAP 173* 202* 151* 203* 221* 151*   STUDIES:  Lower extremity ultrasound 5/18 >> no evidence of DVT CT head 5/19 >> normal exam Echo 5/20 >> EF 30-35%, increased LVF. Diffuse hypokinesis, akinesis of basilar mid-inferior myocardium, grade 2 diastolic dysfxn  EEG 5/20 >> finding c/w mod to severe global cerebral dysfxn. C/w anoxic injury given clinical course  LE [place] 5/21 >> negative MRI brain 5/24 >> ?thrombosed cortical vein, otherwise normal  CULTURES: Bcx 5/19 X 2 >> negative  ANTIBIOTICS: Clinidamycin 5/19 >> 5/21 Vancomycin 5/19 >> 5/25 Zosyn 5/19 >> 5/22 Meropenem 5/22 >>  SIGNIFICANT EVENTS: 5/19 Admit, cardiac arrest, transfer to ICU; hypothermia protocol started 5/24 off pressors.  MRI w/out evidence of ischemia    LINES/TUBES: ETT 5/19 >> Lt IJ CVL 5/19 >>  ASSESSMENT / PLAN: Pulmonary: Acute respiratory failure after cardiac arrest with pulmonary edema. - pressure support wean as able - mental status barrier to extubation trial - mother considering tracheostomy - f/u CXR intermittently  Cardiac: Asystolic cardiac arrest. 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 Hx of HLD. - coreg - hold isosorbide  Renal: ESRD Hypokalemia - resolved  - HD per renal  GI: Jaundice from shock liver with elevated LFTs. - resolved Moderate protein calorie malnutrition. - tube feeds  Hematology: Anemia of critical illness and chronic disease. - f/u CBC - aranesp  Infectious: Lt foot osteomyelitis. - continue Abx  Endocrine: DM type II. - SSI  Neurological: Acute metabolic encephalopathy. Anoxic encephalopathy after cardiac arrest. Hx of DM neuropathy. - monitor mental status - hold outpt cymbalta, lyrica   DVT prophylaxis - SQ heparin SUP - protonix Nutrition - tube feeds Goals of care - full code  -[name] PGY-1  STAFF NOTE: I, Rory Percy, MD FACP have personally reviewed patient's available data, including medical history, events of note, physical examination and test results as part of my evaluation. I have discussed with resident/NP and other care providers such as pharmacist, RN and RRT. In addition, I personally evaluated patient and elicited key findings of: not following commands, trach with eyes, not with purpose, coarse BS bilateral, edema, abdo soft, pcxr noted I reviewed with int changes volume likely, eeg I reviewed with slowing, she has made very little neuro progress, MRi without anoxia may not be indicative of prognosis, will call back neuro one last time to assess FINAL prognosis, likely we need to move to trach this week, will d/w MOM her wishes, will call ID for recs for osto ABX treatment, for HD today, contiuned TF, will need PEG if trach  placed, I updated mom in room myself in full, glu are controlled The patient is critically ill with multiple organ systems failure and requires high complexity decision making for assessment and support, frequent evaluation and titration of therapies, application of advanced monitoring technologies and extensive interpretation of multiple databases.   Critical Care Time devoted to patient care services described in this note is35 Minutes. This time reflects time of care of this signee: Rory Percy, MD FACP. This critical care time does not reflect procedure time, or teaching time or supervisory time of PA/NP/Med student/Med Resident etc but could involve care discussion time. Rest per NP/medical resident whose note is outlined above and that I agree with   Mcarthur Rossetti. Tyson Alias, MD, FACP Pgr: [phone] [facility] 02/24/2017 9:52 AM

## 2017-02-24 NOTE — Progress Notes (Signed)
Acid bath switched from 2K to 4K for potassium of 3.8 at 0904 - 02/24/17.  Will continue to monitor.

## 2017-02-24 NOTE — Consult Note (Signed)
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 female with IDDM, ESRD on HD, hx of diabetic foot ulcer s/p right TM amputation and most recently L TM amputation in Jan 2018, and revision of amputation site on 4/25. She was following up routinely with dr duda and erin zamora after her surgery, but it was noted that in the week prior to this admission she had wound dehiscence where her leg was painful, erythematous and had marked pitting edema.she was referred to wound care as well as ruled out DVT by [place] given her physical exam and started on clindamycin to treat cellulitis. Her symptoms continued to worsen thus she was admitted on 5/19. During her hospitalization she had cardiac arrest requiring 9 min of resuscitation. She remains intubated with little higher cortical function per neurology who recommended palliiative care. The patient has remained on meropenem for open diabetic foot ulcer/wound dehiscence. In the last 3 days, her wbc is flat at 20K but having eradict fever pattern in the last 3 days. PCCM asked ID for treatment recs should patient's mother want to continue with current care. tmax of 103F yesterday, wbc unchanged at 20K Past Medical History:  Diagnosis Date  . Anemia   . Arthritis    "left hand" (09/15/2013)  . Asthma   . CHF  (congestive heart failure) ([place])   . Chronic bronchitis ([place])    "just about q yr" (09/15/2013)  . Chronic kidney disease    "low kidney function" (09/15/2013) ,  T/Th/Sa  . COPD (chronic obstructive pulmonary disease) ([place])   . Coronary artery disease   . Hyperlipidemia   . Hypertension   . Migraine    "get them alot" (09/15/2013)  . Myocardial infarction [facility]) 04/2015   NSTEMI  . Normal coronary arteries    by cardiac catheterization 09/20/13  . Peripheral vascular disease ([place])   . Pneumonia    "couple times; have it now" (09/15/2013)  . PONV (postoperative nausea and vomiting)   . Restless legs   . Shortness of breath    "just recently; related to the pneumonia" (09/15/2013)  . Type 1 diabetes mellitus (HCC)    type 2    Allergies:  Allergies  Allergen Reactions  . Aspirin Anaphylaxis  . Sulfur Hives  . Tramadol Hives and Other (See Comments)    Pt states she feels weird   . Gabapentin Other (See Comments)    Lethargic     MEDICATIONS: . calcium carbonate (dosed in mg elemental calcium)  2,000 mg of elemental calcium Oral TID  . carvedilol  6.25 mg Per Tube BID WC  . chlorhexidine gluconate (MEDLINE KIT)  15 mL Mouth Rinse BID  . Chlorhexidine Gluconate Cloth  6 each  Topical Q0600  . darbepoetin (ARANESP) injection - DIALYSIS  100 mcg Intravenous Q Tue-HD  . feeding supplement (NEPRO CARB STEADY)  1,000 mL Per Tube Q24H  . feeding supplement (PRO-STAT SUGAR FREE 64)  60 mL Per Tube QID  . [name]'s butt cream   Topical BID  . heparin  5,000 Units Subcutaneous Q8H  . insulin aspart  0-15 Units Subcutaneous Q4H  . mouth rinse  15 mL Mouth Rinse 10 times per day  . pantoprazole sodium  40 mg Per Tube Q24H  . sodium chloride flush  10-40 mL Intracatheter Q12H    Social History  Substance Use Topics  . Smoking status: Never Smoker  . Smokeless tobacco: Never Used  . Alcohol use No    Family History  Problem Relation Age of Onset  . Diabetes Mother   .  Stroke Mother   . Hypertension Father   . Hyperlipidemia Father   . Cancer - Lung Father   . Diabetes Maternal Grandmother   . Cancer Paternal Grandmother     Review of Systems -  Unable to obtain due to being obtunded  OBJECTIVE: Temp:  [98.9 F (37.2 C)-102.1 F (38.9 C)] 100.6 F (38.1 C) (05/29 1933) Pulse Rate:  [87-118] 96 (05/29 2100) Resp:  [0-26] 18 (05/29 2100) BP: (94-145)/(54-84) 127/68 (05/29 2100) SpO2:  [97 %-100 %] 99 % (05/29 2100) FiO2 (%):  [30 %] 30 % (05/29 2100) Weight:  [241 lb 6.5 oz (109.5 kg)-251 lb 8.7 oz (114.1 kg)] 241 lb 6.5 oz (109.5 kg) (05/29 1553) Physical Exam  Constitutional:  responsive to verbal stimuli. appears well-developed and well-nourished. No distress.  HENT: OETT in placeNC/AT, PERRLA, no scleral icterus Mouth/Throat: Oropharynx is clear and moist. No oropharyngeal exudate.  Cardiovascular: Normal rate, regular rhythm and normal heart sounds. Exam reveals no gallop and no friction rub.  No murmur heard.  Pulmonary/Chest: Effort normal and breath sounds normal. No respiratory distress.  has no wheezes.  Neck = supple, no nuchal rigidity Abdominal: Soft. Bowel sounds are normal.  exhibits no distension. There is no tenderness.  Lymphadenopathy: no cervical adenopathy. No axillary adenopathy Neurological: alert and oriented to person, place, and time.  Skin: Skin is warm and dry. Right TM is clean.Lft- TM site shows dehiscence in the central aspect of wound bed with darkened/blackened areas. Foul drainage  LABS: Results for orders placed or performed during the hospital encounter of 02/14/17 (from the past 48 hour(s))  Glucose, capillary     Status: Abnormal   Collection Time: 02/22/17 11:55 PM  Result Value Ref Range   Glucose-Capillary 154 (H) 65 - 99 mg/dL   Comment 1 Capillary Specimen   Comprehensive metabolic panel     Status: Abnormal   Collection Time: 02/23/17  3:25 AM  Result Value Ref Range   Sodium 137 135 - 145  mmol/L   Potassium 3.9 3.5 - 5.1 mmol/L    Comment: DELTA CHECK NOTED   Chloride 97 (L) 101 - 111 mmol/L   CO2 29 22 - 32 mmol/L   Glucose, Bld 193 (H) 65 - 99 mg/dL   BUN 48 (H) 6 - 20 mg/dL   Creatinine, Ser 3.34 (H) 0.44 - 1.00 mg/dL   Calcium 9.0 8.9 - 10.3 mg/dL   Total Protein 5.7 (L) 6.5 - 8.1 g/dL   Albumin 1.9 (L) 3.5 - 5.0 g/dL   AST 35 15 - 41 U/L   ALT 20 14 - 54 U/L   Alkaline Phosphatase 574 (H)  38 - 126 U/L   Total Bilirubin 6.5 (H) 0.3 - 1.2 mg/dL   GFR calc non Af Amer 17 (L) >60 mL/min   GFR calc Af Amer 19 (L) >60 mL/min    Comment: (NOTE) The eGFR has been calculated using the CKD EPI equation. This calculation has not been validated in all clinical situations. eGFR's persistently <60 mL/min signify possible Chronic Kidney Disease.    Anion gap 11 5 - 15  Glucose, capillary     Status: Abnormal   Collection Time: 02/23/17  3:26 AM  Result Value Ref Range   Glucose-Capillary 194 (H) 65 - 99 mg/dL  Glucose, capillary     Status: Abnormal   Collection Time: 02/23/17  4:07 AM  Result Value Ref Range   Glucose-Capillary 173 (H) 65 - 99 mg/dL  Glucose, capillary     Status: Abnormal   Collection Time: 02/23/17  7:38 AM  Result Value Ref Range   Glucose-Capillary 202 (H) 65 - 99 mg/dL   Comment 1 Capillary Specimen   Glucose, capillary     Status: Abnormal   Collection Time: 02/23/17 11:27 AM  Result Value Ref Range   Glucose-Capillary 151 (H) 65 - 99 mg/dL   Comment 1 Capillary Specimen   Glucose, capillary     Status: Abnormal   Collection Time: 02/23/17  3:40 PM  Result Value Ref Range   Glucose-Capillary 203 (H) 65 - 99 mg/dL   Comment 1 Capillary Specimen   Glucose, capillary     Status: Abnormal   Collection Time: 02/23/17  7:59 PM  Result Value Ref Range   Glucose-Capillary 178 (H) 65 - 99 mg/dL   Comment 1 Capillary Specimen   Glucose, capillary     Status: Abnormal   Collection Time: 02/23/17 11:30 PM  Result Value Ref Range    Glucose-Capillary 221 (H) 65 - 99 mg/dL   Comment 1 Capillary Specimen   Glucose, capillary     Status: Abnormal   Collection Time: 02/24/17  4:07 AM  Result Value Ref Range   Glucose-Capillary 151 (H) 65 - 99 mg/dL   Comment 1 Capillary Specimen   CBC     Status: Abnormal   Collection Time: 02/24/17  5:40 AM  Result Value Ref Range   WBC 20.9 (H) 4.0 - 10.5 K/uL   RBC 2.93 (L) 3.87 - 5.11 MIL/uL   Hemoglobin 8.6 (L) 12.0 - 15.0 g/dL   HCT 28.0 (L) 36.0 - 46.0 %   MCV 95.6 78.0 - 100.0 fL   MCH 29.4 26.0 - 34.0 pg   MCHC 30.7 30.0 - 36.0 g/dL   RDW 17.1 (H) 11.5 - 15.5 %   Platelets 424 (H) 150 - 400 K/uL  Renal function panel     Status: Abnormal   Collection Time: 02/24/17  5:45 AM  Result Value Ref Range   Sodium 136 135 - 145 mmol/L   Potassium 4.0 3.5 - 5.1 mmol/L   Chloride 97 (L) 101 - 111 mmol/L   CO2 28 22 - 32 mmol/L   Glucose, Bld 164 (H) 65 - 99 mg/dL   BUN 85 (H) 6 - 20 mg/dL   Creatinine, Ser 4.51 (H) 0.44 - 1.00 mg/dL   Calcium 9.2 8.9 - 10.3 mg/dL   Phosphorus 2.6 2.5 - 4.6 mg/dL   Albumin 1.8 (L) 3.5 - 5.0 g/dL   GFR calc non Af Amer 11 (L) >60 mL/min   GFR calc Af Amer 13 (L) >60 mL/min  Comment: (NOTE) The eGFR has been calculated using the CKD EPI equation. This calculation has not been validated in all clinical situations. eGFR's persistently <60 mL/min signify possible Chronic Kidney Disease.    Anion gap 11 5 - 15  Glucose, capillary     Status: Abnormal   Collection Time: 02/24/17  8:17 AM  Result Value Ref Range   Glucose-Capillary 167 (H) 65 - 99 mg/dL   Comment 1 Notify RN   Renal function panel     Status: Abnormal   Collection Time: 02/24/17  9:04 AM  Result Value Ref Range   Sodium 136 135 - 145 mmol/L   Potassium 3.8 3.5 - 5.1 mmol/L   Chloride 97 (L) 101 - 111 mmol/L   CO2 27 22 - 32 mmol/L   Glucose, Bld 176 (H) 65 - 99 mg/dL   BUN 87 (H) 6 - 20 mg/dL   Creatinine, Ser 4.65 (H) 0.44 - 1.00 mg/dL   Calcium 9.4 8.9 - 10.3 mg/dL     Phosphorus 2.8 2.5 - 4.6 mg/dL   Albumin 1.8 (L) 3.5 - 5.0 g/dL   GFR calc non Af Amer 11 (L) >60 mL/min   GFR calc Af Amer 13 (L) >60 mL/min    Comment: (NOTE) The eGFR has been calculated using the CKD EPI equation. This calculation has not been validated in all clinical situations. eGFR's persistently <60 mL/min signify possible Chronic Kidney Disease.    Anion gap 12 5 - 15  CBC     Status: Abnormal   Collection Time: 02/24/17  9:04 AM  Result Value Ref Range   WBC 20.6 (H) 4.0 - 10.5 K/uL   RBC 2.95 (L) 3.87 - 5.11 MIL/uL   Hemoglobin 8.6 (L) 12.0 - 15.0 g/dL   HCT 28.5 (L) 36.0 - 46.0 %   MCV 96.6 78.0 - 100.0 fL   MCH 29.2 26.0 - 34.0 pg   MCHC 30.2 30.0 - 36.0 g/dL   RDW 17.3 (H) 11.5 - 15.5 %   Platelets 436 (H) 150 - 400 K/uL  Glucose, capillary     Status: Abnormal   Collection Time: 02/24/17 12:09 PM  Result Value Ref Range   Glucose-Capillary 126 (H) 65 - 99 mg/dL   Comment 1 Notify RN   Glucose, capillary     Status: None   Collection Time: 02/24/17  4:03 PM  Result Value Ref Range   Glucose-Capillary 97 65 - 99 mg/dL   Comment 1 Notify RN   Glucose, capillary     Status: Abnormal   Collection Time: 02/24/17  7:20 PM  Result Value Ref Range   Glucose-Capillary 163 (H) 65 - 99 mg/dL   Comment 1 Capillary Specimen    Comment 2 Notify RN    Comment 3 Document in Chart     MICRO: 5/19 blood cx ngtd IMAGING: Dg Chest Port 1 View  Result Date: 02/24/2017 CLINICAL DATA:  Respiratory failure EXAM: PORTABLE CHEST 1 VIEW COMPARISON:  Two days ago FINDINGS: Endotracheal tube tip at the clavicular heads. An orogastric tube reaches the stomach. Esophageal thermistor. Left IJ central line with tip at the SVC level. Cardiomegaly and diffuse hazy opacity with cephalized blood flow. No definitive effusion. No pneumothorax. IMPRESSION: 1. Stable positioning of tubes and central line. 2. CHF pattern. Electronically Signed   By: Monte Fantasia M.D.   On: 02/24/2017 07:31    Assessment/Plan:  36 yo F with IDDM, ESRD on HD, cardiac arrest with anoxic brain injury with residual  soft tissue infection of left TM revision wound dehiscence.   - recommend to de-escalate abtx to ceftriaxone, flagyl ,plus vancomycin.  - still concerned that her leg is warm and red. If de-escalaates and worsening drainage, may need to change to pseudomonal   Fevers = suspect they are central

## 2017-02-24 NOTE — Progress Notes (Signed)
Assessment: 1. Asystolic in hospital cardiac arrest (5/19). CPR in house. Unclear etiology. S/p cooling protocol. 2. AMS/anoxic brain injury -  MRI not impressive. EEG non specific diffuse slowing.  3. L foot osteo - s/p  L TMA 4/25, dehisced on adm. Meropenem.No imminent surgical plans.   4. ESRD TTS HD.(levophed prn for BP support)  Try again for large goal, levophed prn during HD. HD today. 5. Acute resp failure; on vent. 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 Once back on usual TTS add back calcitriol  7. Anemia - Mircera 75 mcg (5/10). Darbe 100/wk here. Last given 5/22. PRBC's X 1 on 5/23, X2 on 5/25.  8. NICM EF 35-40% 9. Shock liver - improving  Subjective: Interval History: No new issue, fever to 100.5 yesterday  Objective: Vital signs in last 24 hours: Temp:  [98.2 F (36.8 C)-102.9 F (39.4 C)] 100.5 F (38.1 C) (05/29 0700) Pulse Rate:  [102-124] 105 (05/29 0900) Resp:  [18-37] 21 (05/29 0735) BP: (128-148)/(65-85) 140/76 (05/29 0900) SpO2:  [99 %-100 %] 99 % (05/29 0735) FiO2 (%):  [30 %] 30 % (05/29 0735) Weight:  [114.1 kg (251 lb 8.7 oz)] 114.1 kg (251 lb 8.7 oz) (05/29 0400) Weight change: -5.6 kg (-12 lb 5.5 oz)  Intake/Output from previous day: 05/28 0701 - 05/29 0700 In: 1430 [I.V.:230; NG/GT:1100; IV Piggyback:100] Out: 200 [Stool:200] Intake/Output this shift: No intake/output data recorded.  General appearance: eyes open, turning head, looking nonpurposeful Resp: clear to auscultation bilaterally Chest wall: no tenderness Cardio: regular rate and rhythm, S1, S2 normal, no murmur, click, rub or gallop  AVF LUE  Lab Results:  Recent Labs  02/22/17 0430 02/24/17 0540  WBC 18.9* 20.9*  HGB 9.1* 8.6*  HCT 28.8* 28.0*  PLT 312 424*   BMET:  Recent Labs  02/23/17 0325 02/24/17 0545  NA 137 136  K 3.9 4.0  CL 97* 97*  CO2 29 28  GLUCOSE 193* 164*  BUN 48* 85*  CREATININE 3.34* 4.51*  CALCIUM  9.0 9.2   No results for input(s): PTH in the last 72 hours. Iron Studies: No results for input(s): IRON, TIBC, TRANSFERRIN, FERRITIN in the last 72 hours. Studies/Results: Dg Chest Port 1 View  Result Date: 02/24/2017 CLINICAL DATA:  Respiratory failure EXAM: PORTABLE CHEST 1 VIEW COMPARISON:  Two days ago FINDINGS: Endotracheal tube tip at the clavicular heads. An orogastric tube reaches the stomach. Esophageal thermistor. Left IJ central line with tip at the SVC level. Cardiomegaly and diffuse hazy opacity with cephalized blood flow. No definitive effusion. No pneumothorax. IMPRESSION: 1. Stable positioning of tubes and central line. 2. CHF pattern. Electronically Signed   By: Monte Fantasia M.D.   On: 02/24/2017 07:31    Scheduled: . calcium carbonate (dosed in mg elemental calcium)  2,000 mg of elemental calcium Oral TID  . carvedilol  6.25 mg Per Tube BID WC  . chlorhexidine gluconate (MEDLINE KIT)  15 mL Mouth Rinse BID  . Chlorhexidine Gluconate Cloth  6 each Topical Q0600  . darbepoetin (ARANESP) injection - DIALYSIS  100 mcg Intravenous Q Tue-HD  . feeding supplement (NEPRO CARB STEADY)  1,000 mL Per Tube Q24H  . feeding supplement (PRO-STAT SUGAR FREE 64)  60 mL Per Tube QID  . [name]'s butt cream   Topical BID  . heparin  5,000 Units Subcutaneous Q8H  . insulin aspart  0-15 Units Subcutaneous Q4H  . mouth rinse  15  mL Mouth Rinse 10 times per day  . pantoprazole sodium  40 mg Per Tube Q24H  . sodium chloride flush  10-40 mL Intracatheter Q12H     LOS: 10 days   Kammi Hechler C 02/24/2017,9:30 AM

## 2017-02-25 ENCOUNTER — Inpatient Hospital Stay (HOSPITAL_COMMUNITY): Payer: Medicaid Other

## 2017-02-25 DIAGNOSIS — Z431 Encounter for attention to gastrostomy: Secondary | ICD-10-CM

## 2017-02-25 DIAGNOSIS — Z452 Encounter for adjustment and management of vascular access device: Secondary | ICD-10-CM

## 2017-02-25 LAB — GLUCOSE, CAPILLARY
GLUCOSE-CAPILLARY: 127 mg/dL — AB (ref 65–99)
GLUCOSE-CAPILLARY: 152 mg/dL — AB (ref 65–99)
GLUCOSE-CAPILLARY: 156 mg/dL — AB (ref 65–99)
Glucose-Capillary: 134 mg/dL — ABNORMAL HIGH (ref 65–99)
Glucose-Capillary: 148 mg/dL — ABNORMAL HIGH (ref 65–99)
Glucose-Capillary: 167 mg/dL — ABNORMAL HIGH (ref 65–99)
Glucose-Capillary: 177 mg/dL — ABNORMAL HIGH (ref 65–99)

## 2017-02-25 LAB — BASIC METABOLIC PANEL
ANION GAP: 11 (ref 5–15)
BUN: 56 mg/dL — ABNORMAL HIGH (ref 6–20)
CALCIUM: 8.8 mg/dL — AB (ref 8.9–10.3)
CHLORIDE: 98 mmol/L — AB (ref 101–111)
CO2: 28 mmol/L (ref 22–32)
CREATININE: 3.53 mg/dL — AB (ref 0.44–1.00)
GFR calc non Af Amer: 15 mL/min — ABNORMAL LOW (ref >60)
GFR, EST AFRICAN AMERICAN: 18 mL/min — AB (ref >60)
GLUCOSE: 137 mg/dL — AB (ref 65–99)
Potassium: 4.2 mmol/L (ref 3.5–5.1)
Sodium: 137 mmol/L (ref 135–145)

## 2017-02-25 LAB — CBC
HCT: 28.3 % — ABNORMAL LOW (ref 36.0–46.0)
HEMOGLOBIN: 8.6 g/dL — AB (ref 12.0–15.0)
MCH: 29.6 pg (ref 26.0–34.0)
MCHC: 30.4 g/dL (ref 30.0–36.0)
MCV: 97.3 fL (ref 78.0–100.0)
Platelets: 511 10*3/uL — ABNORMAL HIGH (ref 150–400)
RBC: 2.91 MIL/uL — ABNORMAL LOW (ref 3.87–5.11)
RDW: 17.3 % — AB (ref 11.5–15.5)
WBC: 20.2 10*3/uL — ABNORMAL HIGH (ref 4.0–10.5)

## 2017-02-25 MED ORDER — VANCOMYCIN HCL 10 G IV SOLR
2000.0000 mg | Freq: Once | INTRAVENOUS | Status: AC
  Administered 2017-02-25: 2000 mg via INTRAVENOUS
  Filled 2017-02-25: qty 2000

## 2017-02-25 MED ORDER — METRONIDAZOLE IN NACL 5-0.79 MG/ML-% IV SOLN
500.0000 mg | Freq: Three times a day (TID) | INTRAVENOUS | Status: DC
  Administered 2017-02-25 – 2017-03-02 (×15): 500 mg via INTRAVENOUS
  Filled 2017-02-25 (×16): qty 100

## 2017-02-25 MED ORDER — FENTANYL CITRATE (PF) 100 MCG/2ML IJ SOLN
200.0000 ug | Freq: Once | INTRAMUSCULAR | Status: AC
  Administered 2017-02-26: 200 ug via INTRAVENOUS
  Filled 2017-02-25: qty 4

## 2017-02-25 MED ORDER — ETOMIDATE 2 MG/ML IV SOLN
40.0000 mg | Freq: Once | INTRAVENOUS | Status: AC
  Administered 2017-02-26: 20 mg via INTRAVENOUS

## 2017-02-25 MED ORDER — VECURONIUM BROMIDE 10 MG IV SOLR: 10.0000 mg | Freq: Once | INTRAVENOUS | Status: AC

## 2017-02-25 MED ORDER — VANCOMYCIN HCL IN DEXTROSE 1-5 GM/200ML-% IV SOLN
1000.0000 mg | INTRAVENOUS | Status: AC
  Administered 2017-02-26 – 2017-03-05 (×4): 1000 mg via INTRAVENOUS
  Filled 2017-02-25 (×5): qty 200

## 2017-02-25 MED ORDER — FENTANYL CITRATE (PF) 100 MCG/2ML IJ SOLN: 200.0000 ug | Freq: Once | INTRAMUSCULAR | Status: DC

## 2017-02-25 MED ORDER — DEXTROSE 5 % IV SOLN
2.0000 g | INTRAVENOUS | Status: DC
  Administered 2017-02-25 – 2017-03-01 (×5): 2 g via INTRAVENOUS
  Filled 2017-02-25 (×7): qty 2

## 2017-02-25 MED ORDER — MIDAZOLAM HCL 2 MG/2ML IJ SOLN: 4.0000 mg | Freq: Once | INTRAMUSCULAR | Status: DC

## 2017-02-25 MED ORDER — MIDAZOLAM HCL 2 MG/2ML IJ SOLN
4.0000 mg | Freq: Once | INTRAMUSCULAR | Status: AC
  Administered 2017-02-26: 4 mg via INTRAVENOUS
  Filled 2017-02-25: qty 4

## 2017-02-25 MED ORDER — PROPOFOL 500 MG/50ML IV EMUL
5.0000 ug/kg/min | Freq: Once | INTRAVENOUS | Status: AC
  Administered 2017-02-26: 50 ug/kg/min via INTRAVENOUS
  Filled 2017-02-25: qty 50

## 2017-02-25 NOTE — Progress Notes (Signed)
CSW consulted to assist with concerns about patient mom's safety and fact that pt mom does not have access to a glucose monitor at this time.  CSW met with patient mom Kendrick Fries) to discuss concerns.  Pt mom confirms that her glucose monitor is broken but states she has access to all of her medications/insulin that she needs.  She states that she has not attempted to get another one at this time or talked to a family member or friend about helping her obtain one.  Kendrick Fries states that pt boyfriend, Jeanell Sparrow, is coming to the hospital this afternoon.  CSW inquired whether or not he would be able to assist with getting new glucose monitor- Kendrick Fries states she will call him to see if he can go by a pharmacy to obtain on way to hospital- does not want CSW to call him.  Kendrick Fries states that she has a stable housing situation but does not have consistent transportation to get back and forth so has been staying in the hospital. Kendrick Fries reports she has no issues obtaining food at this time.  CSW spoke with diabetes coordinators concerns Yvonne's situation without glucose monitor and they were able to provide with glucose monitor and 10 strips.  CSW provided to patient mom and encouraged to ask her pharmacy about coverage for that brand of strip.  No further CSW needs reported at this time- please reconsult if further assistance is needed.  Jorge Ny, LCSW Clinical Social Worker [phone]

## 2017-02-25 NOTE — Progress Notes (Signed)
Nutrition Follow-up  DOCUMENTATION CODES:   Morbid obesity  INTERVENTION:   Continue:  Nepro at goal rate of 20 ml/h (480 ml per day) and Prostat 60 ml QID   TF regimen to provide 1664 kcals, 159 gm protein, 349 ml free water daily  NUTRITION DIAGNOSIS:   Inadequate oral intake related to inability to eat as evidenced by NPO status Ongoing.   GOAL:   Provide needs based on ASPEN/SCCM guidelines Met.   MONITOR:   Vent status, TF tolerance, Labs, Weight trends, Skin, I & O's  ASSESSMENT:   36 yo Female w/ ESRD d/t poorly controlled DM, bilateral transmetatarsal foot amputations, obesity, HTN, medical non-compliance as well as NICM. Admitted with complaints of left leg pain, found to have radiological evidence of osteomyelitis with wound nonhealing, dehiscence, purulent drainage from the wound. Admitted for IV antibiotics, hemodialysis and possible surgery.  5/19 Cardiac arrest Possible plans for trach/PEG Patient is currently intubated on ventilator support Temp (24hrs), Avg:100.4 F (38 C), Min:99.8 F (37.7 C), Max:101.3 F (38.5 C)  OGT in place Medications reviewed and includes calcium carbonate.  Labs reviewed.  Phosphorus 2.8 CBG's 127-177. HD removed 4.5 L 5/29 Weight stable  Diet Order:  Diet NPO time specified Diet NPO time specified  Skin:     Left heel DM foot ulcer unstageable to lower heel, DTI to upper heel Stage II anus  Last BM:  5/28 200 ml via rectal tube  Height:   Ht Readings from Last 1 Encounters:  02/14/17 5' 8.5" (1.74 m)   Weight:   Wt Readings from Last 1 Encounters:  02/25/17 239 lb 3.2 oz (108.5 kg)   Ideal Body Weight:  63.6 kg  BMI:  Body mass index is 35.84 kg/m.  Estimated Nutritional Needs:   Kcal:  1375-1750  Protein:  >/= 159 gm  Fluid:  per MD  EDUCATION NEEDS:   No education needs identified at this time  Arthur Holms, RD, LDN Pager #: [phone] After-Hours Pager #: [phone]

## 2017-02-25 NOTE — Progress Notes (Signed)
Assessment: 1. Asystolic in hospital cardiac arrest (5/19). CPR in house. Unclear etiology. S/p cooling protocol. 2. AMS/anoxic brain injury - poor prognosis  3. 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  4. ESRD TTS HD.(levophed prn for BP support)  -4500cc yesterday! 5. Acute resp failure; on vent. CXR CHF but stable on 30% oxygen CCM  Subjective: Interval History: No change, -4500cc off with HD, CXR less congestion  Objective: Vital signs in last 24 hours: Temp:  [99.8 F (37.7 C)-101.3 F (38.5 C)] 101.3 F (38.5 C) (05/30 0700) Pulse Rate:  [87-111] 102 (05/30 0859) Resp:  [18-30] 18 (05/30 0742) BP: (94-127)/(54-72) 117/60 (05/30 0859) SpO2:  [96 %-100 %] 100 % (05/30 0742) FiO2 (%):  [30 %] 30 % (05/30 0742) Weight:  [108.5 kg (239 lb 3.2 oz)-114 kg (251 lb 5.2 oz)] 108.5 kg (239 lb 3.2 oz) (05/30 0500) Weight change: -0.1 kg (-3.5 oz)  Intake/Output from previous day: 05/29 0701 - 05/30 0700 In: 910 [I.V.:280; NG/GT:580; IV Piggyback:50] Out: 4700 [Stool:200] Intake/Output this shift: No intake/output data recorded.  No change  Lab Results:  Recent Labs  02/24/17 0904 02/25/17 0504  WBC 20.6* 20.2*  HGB 8.6* 8.6*  HCT 28.5* 28.3*  PLT 436* 511*   BMET:  Recent Labs  02/24/17 0904 02/25/17 0504  NA 136 137  K 3.8 4.2  CL 97* 98*  CO2 27 28  GLUCOSE 176* 137*  BUN 87* 56*  CREATININE 4.65* 3.53*  CALCIUM 9.4 8.8*   No results for input(s): PTH in the last 72 hours. Iron Studies: No results for input(s): IRON, TIBC, TRANSFERRIN, FERRITIN in the last 72 hours. Studies/Results: Dg Chest Port 1 View  Result Date: 02/25/2017 CLINICAL DATA:  Respiratory failure, intubated patient. Diabetes, cardiomyopathy, morbid obesity, end-stage renal disease. EXAM: PORTABLE CHEST 1 VIEW COMPARISON:  Portable chest x-ray of Feb 24, 2017 FINDINGS: The lungs are adequately inflated. The pulmonary interstitial markings are slightly less prominent today. The  pulmonary vascularity remains engorged. The cardiac silhouette remains enlarged. Retrocardiac region on the left is slightly less dense. The endotracheal tube tip lies 5.7 cm above the carina. The esophagogastric tube tip projects below the inferior margin of the image. The left internal jugular venous catheter tip projects over the proximal SVC. IMPRESSION: Slight interval improvement in pulmonary interstitial edema. Stable cardiomegaly. Persistent left lower lobe atelectasis or pneumonia. The support tubes are in reasonable position. Electronically Signed   By: David  Martinique M.D.   On: 02/25/2017 07:57   Dg Chest Port 1 View  Result Date: 02/24/2017 CLINICAL DATA:  Respiratory failure EXAM: PORTABLE CHEST 1 VIEW COMPARISON:  Two days ago FINDINGS: Endotracheal tube tip at the clavicular heads. An orogastric tube reaches the stomach. Esophageal thermistor. Left IJ central line with tip at the SVC level. Cardiomegaly and diffuse hazy opacity with cephalized blood flow. No definitive effusion. No pneumothorax. IMPRESSION: 1. Stable positioning of tubes and central line. 2. CHF pattern. Electronically Signed   By: Monte Fantasia M.D.   On: 02/24/2017 07:31    Scheduled: . calcium carbonate (dosed in mg elemental calcium)  2,000 mg of elemental calcium Oral TID  . carvedilol  6.25 mg Per Tube BID WC  . chlorhexidine gluconate (MEDLINE KIT)  15 mL Mouth Rinse BID  . Chlorhexidine Gluconate Cloth  6 each Topical Q0600  . darbepoetin (ARANESP) injection - DIALYSIS  100 mcg Intravenous Q Tue-HD  . etomidate  40 mg Intravenous Once  . feeding  supplement (NEPRO CARB STEADY)  1,000 mL Per Tube Q24H  . feeding supplement (PRO-STAT SUGAR FREE 64)  60 mL Per Tube QID  . [START ON 02/26/2017] fentaNYL (SUBLIMAZE) injection  200 mcg Intravenous Once  . [name]'s butt cream   Topical BID  . heparin  5,000 Units Subcutaneous Q8H  . insulin aspart  0-15 Units Subcutaneous Q4H  . mouth rinse  15 mL Mouth Rinse 10  times per day  . [START ON 02/26/2017] midazolam  4 mg Intravenous Once  . pantoprazole sodium  40 mg Per Tube Q24H  . sodium chloride flush  10-40 mL Intracatheter Q12H  . vecuronium  10 mg Intravenous Once     LOS: 11 days   Meleni Delahunt C 02/25/2017,11:02 AM

## 2017-02-25 NOTE — Progress Notes (Signed)
Pharmacy Antibiotic Note  [name] is a 36 y.o. female admitted on 02/14/2017 with diarrhea, chills and LLE drainage 2/2 osteomyelitis. She is s/p revision of L TMA 4/25, now with wound dehiscence.  Patient is ESRD on HD TTS . Cultures remain negative. Tm spike back to 101.3 WBC remains elevated 20.  ID recommended ABX changes will discuss LOT   Plan: Restart Vancomycin 2gm x1 load then 1gm TTS with HD Metronidazole 500mg  IV q8h Rocephin 2gm IV q24h   Height: 5' 8.5" (174 cm) Weight: 239 lb 3.2 oz (108.5 kg) IBW/kg (Calculated) : 65.05  Temp (24hrs), Avg:100.5 F (38.1 C), Min:99.8 F (37.7 C), Max:101.3 F (38.5 C)   Recent Labs Lab 02/20/17 4235 02/21/17 0423 02/22/17 0430 02/23/17 0325 02/24/17 0540 02/24/17 0545 02/24/17 0904 02/25/17 0504  WBC  --  18.8* 18.9*  --  20.9*  --  20.6* 20.2*  CREATININE  --  3.35* 4.27* 3.34*  --  4.51* 4.65* 3.53*  VANCORANDOM 23  --   --   --   --   --   --   --     Estimated Creatinine Clearance: 28.4 mL/min (A) (by C-G formula based on SCr of 3.53 mg/dL (H)).    Allergies  Allergen Reactions  . Aspirin Anaphylaxis  . Sulfur Hives  . Tramadol Hives and Other (See Comments)    Pt states she feels weird   . Gabapentin Other (See Comments)    Lethargic     Antimicrobials this admission: Vanc 5/19>> 5/25   5/30 >  Zosyn 5/19>> 5/22 Clindamycin 5/19>> 5/21 Meropenem 5/22 >> 5/29 Metronidazole 5/30> Ceftriaxone 5/30>  Levels: 5/25 pre-HD VR: 23 (on 1gm w/ HD)  Microbiology results: 5/19 BCx: neg 5/19:  MRSA PCR: neg  Leota Sauers Pharm.D. CPP, BCPS Clinical Pharmacist [phone] 02/25/2017 11:57 AM

## 2017-02-25 NOTE — Progress Notes (Signed)
[facility] for Infectious Disease    Date of Admission:  02/14/2017   Total days of antibiotics 10        Day 1 Ceftriaxone + metronidazole + vancomycin          ID: [name] is a 36 y.o. female with IDDM, ESRD on HD, hx of diabetic foot ulcer s/p right TM amputation and most recently L TM amputation in Jan 2018, and revision of amputation site on 4/25.  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 Eyes open and moving spontaneously today during assessment. Mom is at bedside.   Medications:  . calcium carbonate (dosed in mg elemental calcium)  2,000 mg of elemental calcium Oral TID  . carvedilol  6.25 mg Per Tube BID WC  . chlorhexidine gluconate (MEDLINE KIT)  15 mL Mouth Rinse BID  . Chlorhexidine Gluconate Cloth  6 each Topical Q0600  . darbepoetin (ARANESP) injection - DIALYSIS  100 mcg Intravenous Q Tue-HD  . etomidate  40 mg Intravenous Once  . feeding supplement (NEPRO CARB STEADY)  1,000 mL Per Tube Q24H  . feeding supplement (PRO-STAT SUGAR FREE 64)  60 mL Per Tube QID  . [START ON 02/26/2017] fentaNYL (SUBLIMAZE) injection  200 mcg Intravenous Once  . [name]'s butt cream   Topical BID  . heparin  5,000 Units Subcutaneous Q8H  . insulin aspart  0-15 Units Subcutaneous Q4H  . mouth rinse  15 mL Mouth Rinse 10 times per day  . [START ON 02/26/2017] midazolam  4 mg Intravenous Once  . pantoprazole sodium  40 mg Per Tube Q24H  . sodium chloride flush  10-40 mL Intracatheter Q12H  . vecuronium  10 mg  Intravenous Once    Objective: Vital signs in last 24 hours: Temp:  [99.8 F (37.7 C)-101.4 F (38.6 C)] 101.4 F (38.6 C) (05/30 1118) Pulse Rate:  [87-111] 101 (05/30 1300) Resp:  [13-30] 19 (05/30 1300) BP: (94-130)/(54-73) 123/66 (05/30 1300) SpO2:  [96 %-100 %] 99 % (05/30 1300) FiO2 (%):  [30 %] 30 % (05/30 1118) Weight:  [239 lb 3.2 oz (108.5 kg)-241 lb 6.5 oz (109.5 kg)] 239 lb 3.2 oz (108.5 kg) (05/30 0500)  Constitutional:  responsive to verbal stimuli. appears well-developed and well-nourished. No distress.  HENT: OETT in place [place]/AT, PERRLA, no scleral icterus Mouth/Throat: Oropharynx is clear and moist. No oropharyngeal exudate.  Cardiovascular: Normal rate, regular rhythm and normal heart sounds. Exam reveals no gallop and no friction rub.  No murmur heard.  Pulmonary/Chest: Effort normal and breath sounds normal. No respiratory distress.  has no wheezes.  Neck = supple, no nuchal rigidity Abdominal: Soft. Bowel sounds are normal.  exhibits no distension. There is no tenderness.  Lymphadenopathy: no cervical adenopathy. No axillary adenopathy Neurological: alert and oriented to person, place, and time.  Skin: Skin is warm and dry. Right TM is clean.Lft- TM site shows dehiscence in the central aspect of wound bed with darkened/blackened areas. Foul  drainage         Lab Results  Recent Labs  02/24/17 0904 02/25/17 0504  WBC 20.6* 20.2*  HGB 8.6* 8.6*  HCT 28.5* 28.3*  NA 136 137  K 3.8 4.2  CL 97* 98*  CO2 27 28  BUN 87* 56*  CREATININE 4.65* 3.53*   Liver Panel  Recent Labs  02/23/17 0325 02/24/17 0545 02/24/17 0904  PROT 5.7*  --   --   ALBUMIN 1.9* 1.8* 1.8*  AST 35  --   --   ALT 20  --   --   ALKPHOS 574*  --   --   BILITOT 6.5*  --   --    Studies/Results: Dg Chest Port 1 View  Result Date: 02/25/2017 CLINICAL DATA:  Respiratory failure, intubated patient. Diabetes, cardiomyopathy, morbid obesity, end-stage renal disease. EXAM:  PORTABLE CHEST 1 VIEW COMPARISON:  Portable chest x-ray of Feb 24, 2017 FINDINGS: The lungs are adequately inflated. The pulmonary interstitial markings are slightly less prominent today. The pulmonary vascularity remains engorged. The cardiac silhouette remains enlarged. Retrocardiac region on the left is slightly less dense. The endotracheal tube tip lies 5.7 cm above the carina. The esophagogastric tube tip projects below the inferior margin of the image. The left internal jugular venous catheter tip projects over the proximal SVC. IMPRESSION: Slight interval improvement in pulmonary interstitial edema. Stable cardiomegaly. Persistent left lower lobe atelectasis or pneumonia. The support tubes are in reasonable position. Electronically Signed   By: David  Martinique M.D.   On: 02/25/2017 07:57   Dg Chest Port 1 View  Result Date: 02/24/2017 CLINICAL DATA:  Respiratory failure EXAM: PORTABLE CHEST 1 VIEW COMPARISON:  Two days ago FINDINGS: Endotracheal tube tip at the clavicular heads. An orogastric tube reaches the stomach. Esophageal thermistor. Left IJ central line with tip at the SVC level. Cardiomegaly and diffuse hazy opacity with cephalized blood flow. No definitive effusion. No pneumothorax. IMPRESSION: 1. Stable positioning of tubes and central line. 2. CHF pattern. Electronically Signed   By: Monte Fantasia M.D.   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  - Would recommend for Dr. Sharol Given to assess wound for consideration of debridement vs amputation with clinical appearance and what was found on xrays  recently. Of note she now has other areas on her heel and achilles with eschar. Sutures remain in place but does not appear to be doing much with degree of dehiscence --> can we remove these? Role for santyl?  - WOC consult for recs on other areas?   Fevers =  - Still febrile today 101.4, WBC 20.9 >> 20.2  - Likely source is foot infection with evidence of bone involvement on xrays  Ventilatory Failure = - planning trach tomorrow afternoon    For now would continue Metronidazole + Vanco + Ceftriaxone. Regarding LOT would continue until determination from ortho team as we feel that debridement is needed.   Janene Madeira, MSN, NP-C [facility] for Infectious [place] Cell: [phone] Pager: [phone]  02/25/2017, 2:27 PM

## 2017-02-25 NOTE — Progress Notes (Signed)
PULMONARY / CRITICAL CARE MEDICINE   Name: [name] MRN: [id number] DOB: 1981/06/10    ADMISSION DATE:  02/14/2017 CONSULTATION DATE:  02/14/2017  REFERRING MD:  Feliz Beam MD  CHIEF COMPLAINT:  Sepsis, cardiac arrest  HISTORY OF PRESENT ILLNESS:   36 year old female w/ ESRD d/t poorly controlled DM, bilateral transmetatarsal foot amputations, obesity, HTN, medical non-compliance as well as NICM. Admitted 5/19 with complaints of left leg pain, found to have radiological evidence of osteomyelitis with wound nonhealing, dehiscence, purulent drainage from the wound. Admitted for IV antibiotics, hemodialysis and possible surgery.  Found unresponsive in the room on 5/19. She had chest compressions, epi bicarb given. She was intubated by anesthesia and had ROSC in approximately 9 minutes. Downtime prior to code is unknown. She got dilaudid in the afternoon for leg pain.  SUBJECTIVE:  Febrile overnight. Failed weaning attempt this morning.  VITAL SIGNS: BP (!) 112/56   Pulse (!) 105   Temp 100.2 F (37.9 C) (Oral)   Resp 19   Ht 5' 8.5" (1.74 m)   Wt 239 lb 3.2 oz (108.5 kg)   LMP  (LMP Unknown)   SpO2 99%   BMI 35.84 kg/m   VENTILATOR SETTINGS: Vent Mode: PRVC FiO2 (%):  [30 %] 30 % Set Rate:  [18 bmp] 18 bmp Vt Set:  [520 mL] 520 mL PEEP:  [5 cmH20] 5 cmH20 Pressure Support:  [15 cmH20] 15 cmH20 Plateau Pressure:  [17 cmH20-22 cmH20] 22 cmH20  INTAKE / OUTPUT: I/O last 3 completed shifts: In: 1330 [I.V.:380; NG/GT:900; IV Piggyback:50] Out: 4700 [Other:4500; Stool:200]  General - on vent ENT - ETT in place Cardiac - regular, no murmur, tachycardic Chest - no wheeze, rales Abd - soft, non tender Ext - 1+ edema, left foot amputation, right metatarsal amputation Skin - no rashes Neuro - opens eyes on command   LABS:  BMET  Recent Labs Lab 02/24/17 0545 02/24/17 0904 02/25/17 0504  NA 136 136 137  K 4.0 3.8 4.2  CL 97* 97* 98*  CO2 28 27 28   BUN 85* 87* 56*   CREATININE 4.51* 4.65* 3.53*  GLUCOSE 164* 176* 137*    Electrolytes  Recent Labs Lab 02/22/17 0430  02/24/17 0545 02/24/17 0904 02/25/17 0504  CALCIUM 9.2  < > 9.2 9.4 8.8*  PHOS 2.7  --  2.6 2.8  --   < > = values in this interval not displayed.  CBC  Recent Labs Lab 02/24/17 0540 02/24/17 0904 02/25/17 0504  WBC 20.9* 20.6* 20.2*  HGB 8.6* 8.6* 8.6*  HCT 28.0* 28.5* 28.3*  PLT 424* 436* 511*    Coag's  Recent Labs Lab 02/18/17 1134 02/21/17 0423  APTT 42*  --   INR 1.38 1.27    Sepsis Markers No results for input(s): LATICACIDVEN, PROCALCITON, O2SATVEN in the last 168 hours.  ABG No results for input(s): PHART, PCO2ART, PO2ART in the last 168 hours.  Liver Enzymes  Recent Labs Lab 02/21/17 0423 02/22/17 0430 02/23/17 0325 02/24/17 0545 02/24/17 0904  AST 34 39 35  --   --   ALT 21 19 20   --   --   ALKPHOS 516* 541* 574*  --   --   BILITOT 10.6* 8.3* 6.5*  --   --   ALBUMIN 1.5*  1.6* 1.6*  1.7* 1.9* 1.8* 1.8*    Cardiac Enzymes No results for input(s): TROPONINI, PROBNP in the last 168 hours.  Glucose  Recent Labs Lab 02/24/17 0407 02/24/17  1610 02/24/17 1209 02/24/17 1603 02/24/17 1920 02/24/17 2352  GLUCAP 151* 167* 126* 97 163* 177*   STUDIES:  Lower extremity ultrasound 5/18 >> no evidence of DVT CT head 5/19 >> normal exam Echo 5/20 >> EF 30-35%, increased LVF. Diffuse hypokinesis, akinesis of basilar mid-inferior myocardium, grade 2 diastolic dysfxn  EEG 5/20 >> finding c/w mod to severe global cerebral dysfxn. C/w anoxic injury given clinical course  LE [place] 5/21 >> negative MRI brain 5/24 >> ?thrombosed cortical vein, otherwise normal  CULTURES: Bcx 5/19 X 2 >> negative  ANTIBIOTICS: Clinidamycin 5/19 >> 5/21 Vancomycin 5/19 >> 5/25 >> restart 5/30 Zosyn 5/19 >> 5/22 Meropenem 5/22 >> 5/30 Flagyl 5/30>> Ceftriaxone 5/30 >>  SIGNIFICANT EVENTS: 5/19 Admit, cardiac arrest, transfer to ICU; hypothermia protocol  started 5/24 off pressors.  MRI w/out evidence of ischemia   LINES/TUBES: ETT 5/19 >> Lt IJ CVL 5/19 >>  ASSESSMENT / PLAN: Pulmonary: Acute respiratory failure after cardiac arrest with pulmonary edema. - pressure support wean as able - mental status barrier to extubation trial - mother considering tracheostomy for today - f/u CXR intermittently  Cardiac: Asystolic cardiac arrest. 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 Hx of HLD. - coreg - hold isosorbide  Renal: ESRD Hypokalemia - resolved  - HD per renal  GI: Jaundice from shock liver with elevated LFTs. - resolved Moderate protein calorie malnutrition. - tube feeds  Hematology: Anemia of critical illness and chronic disease. - f/u CBC - aranesp  Infectious: Lt foot osteomyelitis. Per ID recommend total 10 days of antibiotics.  De-escalate to ceftriaxone, flagyl plus vancomycin. - change antibiotics per ID recommendation   Endocrine: DM type II. - SSI  Neurological: Acute metabolic encephalopathy. Anoxic encephalopathy after cardiac arrest. - Neurology consulted and prognosis is dismal, recommended palliative care. Hx of DM neuropathy. - monitor mental status - hold outpt cymbalta, lyrica - family meeting about continued aggressive treatment vs palliative care   DVT prophylaxis - SQ heparin SUP - protonix Nutrition - tube feeds Goals of care - full code  -[name] PGY-1  STAFF NOTE: I, Rory Percy, MD FACP have personally reviewed patient's available data, including medical history, events of note, physical examination and test results as part of my evaluation. I have discussed with resident/NP and other care providers such as pharmacist, RN and RRT. In addition, I personally evaluated patient and elicited key findings of: maybe slight more awake, NOT fc, perrl, tracks eyes, ronchi imrpoved, abdo soft, she had HD and was neg 3.8 liters, pcxr I reviewed revelaed slight improved  int changes after meg balance and ett sligh thigh ( will advance ), neuro prognosis noted as dismal, will again discuss with mom wishes for trach, if wished then for thur at 3 pm likely, plat wnl, ID recs appreciated, coags wnl on 26th, NPO just prior to trach, weaning again today cpap 5 ps5-12, mayneed higher to 20, wil need peg also likely The patient is critically ill with multiple organ systems failure and requires high complexity decision making for assessment and support, frequent evaluation and titration of therapies, application of advanced monitoring technologies and extensive interpretation of multiple databases.   Critical Care Time devoted to patient care services described in this note is 35 Minutes. This time reflects time of care of this signee: Rory Percy, MD FACP. This critical care time does not reflect procedure time, or teaching time or supervisory time of PA/NP/Med student/Med Resident etc but could involve care discussion time.  Rest per NP/medical resident whose note is outlined above and that I agree with   Mcarthur Rossetti. Tyson Alias, MD, FACP Pgr: [phone] [facility] 02/25/2017 10:28 AM

## 2017-02-26 ENCOUNTER — Telehealth (INDEPENDENT_AMBULATORY_CARE_PROVIDER_SITE_OTHER): Payer: Self-pay

## 2017-02-26 ENCOUNTER — Inpatient Hospital Stay (HOSPITAL_COMMUNITY): Payer: Medicaid Other

## 2017-02-26 LAB — CBC
HCT: 27.6 % — ABNORMAL LOW (ref 36.0–46.0)
HEMOGLOBIN: 8.3 g/dL — AB (ref 12.0–15.0)
MCH: 29 pg (ref 26.0–34.0)
MCHC: 30.1 g/dL (ref 30.0–36.0)
MCV: 96.5 fL (ref 78.0–100.0)
Platelets: 566 10*3/uL — ABNORMAL HIGH (ref 150–400)
RBC: 2.86 MIL/uL — ABNORMAL LOW (ref 3.87–5.11)
RDW: 16.9 % — ABNORMAL HIGH (ref 11.5–15.5)
WBC: 22.2 10*3/uL — AB (ref 4.0–10.5)

## 2017-02-26 LAB — BASIC METABOLIC PANEL
Anion gap: 14 (ref 5–15)
BUN: 71 mg/dL — ABNORMAL HIGH (ref 6–20)
CALCIUM: 8.9 mg/dL (ref 8.9–10.3)
CHLORIDE: 100 mmol/L — AB (ref 101–111)
CO2: 26 mmol/L (ref 22–32)
CREATININE: 4.88 mg/dL — AB (ref 0.44–1.00)
GFR calc non Af Amer: 10 mL/min — ABNORMAL LOW (ref >60)
GFR, EST AFRICAN AMERICAN: 12 mL/min — AB (ref >60)
Glucose, Bld: 149 mg/dL — ABNORMAL HIGH (ref 65–99)
Potassium: 3.9 mmol/L (ref 3.5–5.1)
SODIUM: 140 mmol/L (ref 135–145)

## 2017-02-26 LAB — GLUCOSE, CAPILLARY
GLUCOSE-CAPILLARY: 103 mg/dL — AB (ref 65–99)
GLUCOSE-CAPILLARY: 154 mg/dL — AB (ref 65–99)
GLUCOSE-CAPILLARY: 166 mg/dL — AB (ref 65–99)
GLUCOSE-CAPILLARY: 92 mg/dL (ref 65–99)
GLUCOSE-CAPILLARY: 97 mg/dL (ref 65–99)
Glucose-Capillary: 169 mg/dL — ABNORMAL HIGH (ref 65–99)
Glucose-Capillary: 116 mg/dL — ABNORMAL HIGH (ref 65–99)

## 2017-02-26 LAB — HEPATIC FUNCTION PANEL
ALBUMIN: 1.7 g/dL — AB (ref 3.5–5.0)
ALK PHOS: 558 U/L — AB (ref 38–126)
ALT: 12 U/L — ABNORMAL LOW (ref 14–54)
AST: 25 U/L (ref 15–41)
Bilirubin, Direct: 1.8 mg/dL — ABNORMAL HIGH (ref 0.1–0.5)
Indirect Bilirubin: 1.7 mg/dL — ABNORMAL HIGH (ref 0.3–0.9)
TOTAL PROTEIN: 5.7 g/dL — AB (ref 6.5–8.1)
Total Bilirubin: 3.5 mg/dL — ABNORMAL HIGH (ref 0.3–1.2)

## 2017-02-26 MED ORDER — FENTANYL CITRATE (PF) 100 MCG/2ML IJ SOLN: 25.0000 ug | INTRAMUSCULAR | Status: AC | PRN

## 2017-02-26 NOTE — Procedures (Signed)
Bronchoscopy Procedure Note [name] [id number] [birth year]  Procedure: Bronchoscopy Indications: To facilitate perc trach placement. Please refer also to Dr Gwendolyn Grant procedure note.   Procedure Details Consent: Risks of procedure as well as the alternatives and risks of each were explained to the (patient/caregiver).  Consent for procedure obtained. Time Out: Verified patient identification, verified procedure, site/side was marked, verified correct patient position, special equipment/implants available, medications/allergies/relevent history reviewed, required imaging and test results available.  Performed  FOB performed via ETT to facilitate trach placement. Needle and guidewire visualized, no evidence posterior tracheal wall puncture or injury. Serial dilations performed without complication and trach then placed. FOB through the trach showed good position 4cm above the carina and no evidence active bleeding. .   No sample taken.   Evaluation Hemodynamic Status: BP stable throughout; O2 sats: stable throughout Patient's Current Condition: stable Specimens:  None Complications: No apparent complications Patient did tolerate procedure well.   [name] S. 02/26/2017

## 2017-02-26 NOTE — Progress Notes (Signed)
Assessment: 1. Asystolic in hospital cardiac arrest (5/19). CPR in house. Unclear etiology.S/p cooling protocol. 2. AMS/anoxic brain injury - poor prognosis  3. 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  4. ESRD TTS HD.(levophed prn for BP support) - 5. Acute resp failure; on vent. CXR CHF but stable on 30% oxygen  Subjective: Interval History: More responsive reportedly  Objective: Vital signs in last 24 hours: Temp:  [99.9 F (37.7 C)-101.4 F (38.6 C)] 99.9 F (37.7 C) (05/31 0404) Pulse Rate:  [93-110] 109 (05/31 0700) Resp:  [13-29] 23 (05/31 0700) BP: (116-131)/(60-106) 130/74 (05/31 0806) SpO2:  [91 %-100 %] 100 % (05/31 0806) FiO2 (%):  [30 %] 30 % (05/31 0806) Weight:  [108.9 kg (240 lb 1.3 oz)] 108.9 kg (240 lb 1.3 oz) (05/31 0600) Weight change: -5.1 kg (-11 lb 3.9 oz)  Intake/Output from previous day: 05/30 0701 - 05/31 0700 In: 1320 [I.V.:250; NG/GT:320; IV Piggyback:750] Out: 500 [Stool:500] Intake/Output this shift: No intake/output data recorded.  General appearance: turned head to my voice Head: Normocephalic, without obvious abnormality, atraumatic Eyes:  look at me initially then did not follow Resp: clear to auscultation bilaterally Extremities: edema 2-3+  Lab Results:  Recent Labs  02/25/17 0504 02/26/17 0515  WBC 20.2* 22.2*  HGB 8.6* 8.3*  HCT 28.3* 27.6*  PLT 511* 566*   BMET:  Recent Labs  02/25/17 0504 02/26/17 0515  NA 137 140  K 4.2 3.9  CL 98* 100*  CO2 28 26  GLUCOSE 137* 149*  BUN 56* 71*  CREATININE 3.53* 4.88*  CALCIUM 8.8* 8.9   No results for input(s): PTH in the last 72 hours. Iron Studies: No results for input(s): IRON, TIBC, TRANSFERRIN, FERRITIN in the last 72 hours. Studies/Results: Dg Chest Port 1 View  Result Date: 02/26/2017 CLINICAL DATA:  Hypoxia.  Shortness of breath. EXAM: PORTABLE CHEST 1 VIEW COMPARISON:  02/25/2017. FINDINGS: Endotracheal tube, NG tube, left IJ line esophageal probe in  stable position. Cardiomegaly with bilateral pulmonary interstitial infiltrates consistent with CHF. Left base atelectasis . Small left pleural effusion. Similar findings noted on prior exam. No pneumothorax. IMPRESSION: 1. Lines and tubes in stable position. 2. Cardiomegaly with bilateral pulmonary interstitial prominence and small left pleural effusion noted consistent CHF. Left base atelectasis. Similar findings noted on prior exam. Electronically Signed   By: [place]   On: 02/26/2017 07:18   Dg Chest Port 1 View  Result Date: 02/25/2017 CLINICAL DATA:  Respiratory failure, intubated patient. Diabetes, cardiomyopathy, morbid obesity, end-stage renal disease. EXAM: PORTABLE CHEST 1 VIEW COMPARISON:  Portable chest x-ray of Feb 24, 2017 FINDINGS: The lungs are adequately inflated. The pulmonary interstitial markings are slightly less prominent today. The pulmonary vascularity remains engorged. The cardiac silhouette remains enlarged. Retrocardiac region on the left is slightly less dense. The endotracheal tube tip lies 5.7 cm above the carina. The esophagogastric tube tip projects below the inferior margin of the image. The left internal jugular venous catheter tip projects over the proximal SVC. IMPRESSION: Slight interval improvement in pulmonary interstitial edema. Stable cardiomegaly. Persistent left lower lobe atelectasis or pneumonia. The support tubes are in reasonable position. Electronically Signed   By: David  Martinique M.D.   On: 02/25/2017 07:57    Scheduled: . calcium carbonate (dosed in mg elemental calcium)  2,000 mg of elemental calcium Oral TID  . carvedilol  6.25 mg Per Tube BID WC  . chlorhexidine gluconate (MEDLINE KIT)  15 mL Mouth Rinse BID  .  Chlorhexidine Gluconate Cloth  6 each Topical Q0600  . darbepoetin (ARANESP) injection - DIALYSIS  100 mcg Intravenous Q Tue-HD  . etomidate  40 mg Intravenous Once  . feeding supplement (NEPRO CARB STEADY)  1,000 mL Per Tube Q24H  .  feeding supplement (PRO-STAT SUGAR FREE 64)  60 mL Per Tube QID  . fentaNYL (SUBLIMAZE) injection  200 mcg Intravenous Once  . [name]'s butt cream   Topical BID  . heparin  5,000 Units Subcutaneous Q8H  . insulin aspart  0-15 Units Subcutaneous Q4H  . mouth rinse  15 mL Mouth Rinse 10 times per day  . midazolam  4 mg Intravenous Once  . pantoprazole sodium  40 mg Per Tube Q24H  . sodium chloride flush  10-40 mL Intracatheter Q12H  . vecuronium  10 mg Intravenous Once     LOS: 12 days   Layman Gully C 02/26/2017,8:39 AM

## 2017-02-26 NOTE — Care Management Note (Signed)
Case Management Note  Patient Details  Name: [name] MRN: [id number] Date of Birth: 07/05/1981  Subjective/Objective:  Pt admitted- 5/19 with complaints of left leg pain, found to have evidence of osteomyelitis with wound nonhealing, dehiscence, purulent drainage from the wound. Admitted for IV antibiotics, hemodialysis and possible surgery. Found unresponsive in the room on 5/19 - cardiac arrest- intubated      Patient currently on ventilator via trach with significantly lower mental status since code. Patient unable to care for herself and will require long term care at discharge. Patient from home and independent pta. Main support since hospitalized has been disabled mother who has stayed at bedside in ICU. Mother in [facility] with limited resources to care for self. Per nursing staff has been showering at hospital and washing her undergarments in sink, and unable to secure food for herself while here. CM will continue to follow for LTAC needs as they arise and will place consult for CSW to assist in SNF placement.            Action/Plan: PTA pt lived at home- Hx - ESRD- HD-T/T/S- CM to follow-  Expected Discharge Date:                  Expected Discharge Plan:     In-House Referral:  Clinical Social Work  Discharge planning Services  CM Consult  Post Acute Care Choice:    Choice offered to:     DME Arranged:    DME Agency:     HH Arranged:    HH Agency:     Status of Service:  In process, will continue to follow  If discussed at Long Length of Stay Meetings, dates discussed:    Additional Comments:  Lawerance Sabal, RN 02/26/2017, 4:12 PM

## 2017-02-26 NOTE — Procedures (Addendum)
Name:  [name] MRN:  [id number] DOB:  [birth year]  OPERATIVE NOTE  Procedure:  Percutaneous tracheostomy.  Indications:  Ventilator-dependent respiratory failure.  Consent:  Procedure, alternatives, risks and benefits discussed with medical POA.  Questions answered.  Consent obtained.  Anesthesia:  Roc, prop, versed, fent  Procedure summary:  Appropriate equipment was assembled.  The patient was identified as [name] and safety timeout was performed. The patient was placed in supine position with a towel roll behind shoulder blades and neck extended.  Sterile technique was used. The patient's neck and upper chest were prepped using chlorhexidine / alcohol scrub and the field was draped in usual sterile fashion with full body drape. After the adequate sedation / anesthesia was achieved, attention was directed at the midline trachea, where the cricothyroid membrane was palpated. Approximately two fingerbreadths above the sternal notch, a verticle incision was created with a scalpel after local infiltration with 0.2% Lidocaine. Then, using Seldinger technique and a percutaneous tracheostomy set, the trachea was entered with a 14 gauge needle with an overlying sheath. This was all confirmed under direct visualization of a fiberoptic flexible bronchoscope. Entrance into the trachea was identified through the third tracheal ring interspace. Following this, a guidewire was inserted. The needle was removed, leaving the sheath and the guidewire intact. Next, the sheath was removed and a small dilator was inserted. The tracheal rings were then dilated. A #6 Shiley was then opened. The balloon was checked. It was placed over a tracheal dilator, which was then advanced over the guidewire and through the previously dilated tract. The Shiley tracheostomy tube was noted to pass in the trachea with little resistance. The guidewire and dilator tubes were removed from the trachea. An inner cannula was placed through  the tracheostomy tube. The tracheostomy was then secured at the anterior neck with 4 monofilament sutures. The oral endotracheal tube was removed and the ventilator was attached to the newly placed tracheostomy tube. Adequate tidal volumes were noted. The cuff was inflated and no evidence of air leak was noted. No evidence of bleeding was noted. At this point, the procedure was concluded. Post-procedure chest x-ray was ordered.  Complications:  No immediate complications were noted.  Hemodynamic parameters and oxygenation remained stable throughout the procedure.  Estimated blood loss:  Less then 1 mL.  [name]., MD Pulmonary and Critical Care Medicine [facility] Pager: [phone]  02/26/2017, 2:50 PM   Should follow up in trach clinic Uncle pete [phone]

## 2017-02-26 NOTE — Progress Notes (Signed)
Patient transported on vent from 2H-20 to 61M-11 without complication.

## 2017-02-26 NOTE — Telephone Encounter (Signed)
Judeth Cornfield, NP with Infectious Disease at [facility] would like to know if Dr. [name] can come by to see patient for an opinion on the status of her left foot.  CB# is [phone].  Thank You.

## 2017-02-26 NOTE — Telephone Encounter (Signed)
See message below °

## 2017-02-26 NOTE — Progress Notes (Signed)
[facility] for Infectious Disease    Date of Admission:  02/14/2017   Total days of antibiotics 11                                                                                     Day 2 Ceftriaxone + metronidazole + vancomycin    ID: [name] is a 36 y.o. 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 Mother is present at the bedside. Able to respond to simple commands at times.   Medications:  . calcium carbonate (dosed in mg elemental calcium)  2,000 mg of elemental calcium Oral TID  . carvedilol  6.25 mg Per Tube BID WC  . chlorhexidine gluconate (MEDLINE KIT)  15 mL Mouth Rinse BID  . Chlorhexidine Gluconate Cloth  6 each Topical Q0600  . darbepoetin (ARANESP) injection - DIALYSIS  100 mcg Intravenous Q Tue-HD  . etomidate  40 mg Intravenous Once  . feeding supplement (NEPRO CARB STEADY)  1,000 mL Per Tube Q24H  . feeding supplement (PRO-STAT SUGAR FREE 64)  60 mL Per Tube QID  . fentaNYL (SUBLIMAZE) injection  200 mcg Intravenous Once  . [name]'s butt cream   Topical BID  . heparin  5,000 Units Subcutaneous Q8H  . insulin aspart  0-15 Units Subcutaneous Q4H  . mouth rinse  15 mL Mouth Rinse 10 times per day  . midazolam  4 mg Intravenous Once  . pantoprazole sodium  40 mg Per Tube  Q24H  . sodium chloride flush  10-40 mL Intracatheter Q12H  . vecuronium  10 mg Intravenous Once    Objective: Vital signs in last 24 hours: Temp:  [99.9 F (37.7 C)-101.4 F (38.6 C)] 99.9 F (37.7 C) (05/31 0404) Pulse Rate:  [93-110] 109 (05/31 0700) Resp:  [13-29] 23 (05/31 0700) BP: (116-131)/(64-106) 130/74 (05/31 0806) SpO2:  [91 %-100 %] 100 % (05/31 0806) FiO2 (%):  [30 %] 30 % (05/31 0806) Weight:  [240 lb 1.3 oz (108.9 kg)] 240 lb 1.3 oz (108.9 kg) (05/31 0600)   Constitutional: Awake with eyes open upon walking into room. Able to move legs on command at times. Appears uncomfortable on ventilator.   HENT: OETT in place [place]/AT, PERRLA, no scleral icterus Mouth/Throat: Oropharynx is clear and moist. No oropharyngeal exudate.  Cardiovascular: Normal rate, regular rhythm and normal heart sounds. Exam reveals no gallop and no friction  rub.  No murmur heard.  Pulmonary/Chest: Tachypneic during exam. Bilateral rhonchi. Minimal secretions with suctioning down ETT.   Neck = supple, no nuchal rigidity Abdominal: Soft. Bowel sounds are normal. exhibits no distension. There is no tenderness.  Lymphadenopathy: no cervical adenopathy. No axillary adenopathy Neurologic: MAE x 4. Eyes open and awake.  Skin: Skin is warm and dry. Scattered fluid blisters on posterior right thigh/calf. Right TM is clean and well healed. Left TM site shows dehiscence in the central aspect of wound bed with darkened/blackened areas. Foul purulent/brown drainage persists. Dressings are being done once a day.    Lab Results  Recent Labs  02/25/17 0504 02/26/17 0515  WBC 20.2* 22.2*  HGB 8.6* 8.3*  HCT 28.3* 27.6*  NA 137 140  K 4.2 3.9  CL 98* 100*  CO2 28 26  BUN 56* 71*  CREATININE 3.53* 4.88*   Liver Panel  Recent Labs  02/24/17 0904 02/26/17 0515  PROT  --  5.7*  ALBUMIN 1.8* 1.7*  AST  --  25  ALT  --  12*  ALKPHOS  --  558*  BILITOT  --  3.5*  BILIDIR  --  1.8*  IBILI  --  1.7*     Sedimentation Rate No results for input(s): ESRSEDRATE in the last 72 hours. C-Reactive Protein No results for input(s): CRP in the last 72 hours.  Microbiology:  Studies/Results: Dg Chest Port 1 View  Result Date: 02/26/2017 CLINICAL DATA:  Hypoxia.  Shortness of breath. EXAM: PORTABLE CHEST 1 VIEW COMPARISON:  02/25/2017. FINDINGS: Endotracheal tube, NG tube, left IJ line esophageal probe in stable position. Cardiomegaly with bilateral pulmonary interstitial infiltrates consistent with CHF. Left base atelectasis . Small left pleural effusion. Similar findings noted on prior exam. No pneumothorax. IMPRESSION: 1. Lines and tubes in stable position. 2. Cardiomegaly with bilateral pulmonary interstitial prominence and small left pleural effusion noted consistent CHF. Left base atelectasis. Similar findings noted on prior exam. Electronically Signed   By: [place]   On: 02/26/2017 07:18   Dg Chest Port 1 View  Result Date: 02/25/2017 CLINICAL DATA:  Respiratory failure, intubated patient. Diabetes, cardiomyopathy, morbid obesity, end-stage renal disease. EXAM: PORTABLE CHEST 1 VIEW COMPARISON:  Portable chest x-ray of Feb 24, 2017 FINDINGS: The lungs are adequately inflated. The pulmonary interstitial markings are slightly less prominent today. The pulmonary vascularity remains engorged. The cardiac silhouette remains enlarged. Retrocardiac region on the left is slightly less dense. The endotracheal tube tip lies 5.7 cm above the carina. The esophagogastric tube tip projects below the inferior margin of the image. The left internal jugular venous catheter tip projects over the proximal SVC. IMPRESSION: Slight interval improvement in pulmonary interstitial edema. Stable cardiomegaly. Persistent left lower lobe atelectasis or pneumonia. The support tubes are in reasonable position. Electronically Signed   By: David  Martinique M.D.   On: 02/25/2017 07:57     Assessment/Plan: Ms Both is a 36yo F  with IDDM, ESRD on HD, hx of L TM amputation revision, admitted for worsening drainage and erythema of left TM. Unfortunately she sustained cardiac arrest with anoxic brain injury, underwent cooling protocol. She is still ventilator dependent. Following some commands today.   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  - I contacted [facility] today to request Dr. Sharol Given or his PA to reassess her wound. She was last seen in the office early May. At that time she had some serosanguineous drainage but wound edges were well approximated w/o cellulitis or odor  Fevers =  - Temp , WBC 20.9 >> 20.2 >> 22.2 - Central fevers possibly with brain injury, but WBC continues to be elevated. Foot infection with evidence of bone involvement on xrays so could be the ongoing cause.   Ventilatory Failure = - Planning trach this afternoon - Weaning now but tachypneic . 30% FiO2   No improvement in status of fevers or leukocytosis. For now would continue Metronidazole + Vanco + Ceftriaxone Regarding LOT - pending assessment from ortho team as we feel that debridement is needed  Janene Madeira, MSN, NP-C [facility] [place]: [phone] Pager: [phone]  02/26/2017, 10:36 AM

## 2017-02-26 NOTE — Progress Notes (Signed)
PULMONARY / CRITICAL CARE MEDICINE   Name: [name] MRN: [id number] DOB: May 24, 1981    ADMISSION DATE:  02/14/2017 CONSULTATION DATE:  02/14/2017  REFERRING MD:  Feliz Beam MD  CHIEF COMPLAINT:  Sepsis, cardiac arrest  HISTORY OF PRESENT ILLNESS:   36 year old female w/ ESRD d/t poorly controlled DM, bilateral transmetatarsal foot amputations, obesity, HTN, medical non-compliance as well as NICM. Admitted 5/19 with complaints of left leg pain, found to have radiological evidence of osteomyelitis with wound nonhealing, dehiscence, purulent drainage from the wound. Admitted for IV antibiotics, hemodialysis and possible surgery.  Found unresponsive in the room on 5/19. She had chest compressions, epi bicarb given. She was intubated by anesthesia and had ROSC in approximately 9 minutes. Downtime prior to code is unknown. She got dilaudid in the afternoon for leg pain.  SUBJECTIVE:  Weaning this morning. Following commands   VITAL SIGNS: BP 130/74   Pulse (!) 109   Temp 99.9 F (37.7 C) (Oral)   Resp (!) 23   Ht 5' 8.5" (1.74 m)   Wt 240 lb 1.3 oz (108.9 kg)   LMP  (LMP Unknown)   SpO2 100%   BMI 35.97 kg/m   VENTILATOR SETTINGS: Vent Mode: PSV;CPAP FiO2 (%):  [30 %] 30 % Set Rate:  [18 bmp] 18 bmp Vt Set:  [520 mL] 520 mL PEEP:  [5 cmH20] 5 cmH20 Pressure Support:  [15 cmH20] 15 cmH20 Plateau Pressure:  [13 cmH20-20 cmH20] 20 cmH20  INTAKE / OUTPUT: I/O last 3 completed shifts: In: 1700 [I.V.:390; NG/GT:560; IV Piggyback:750] Out: 500 [Stool:500]  General - on vent ENT - ETT in place Cardiac - regular, no murmur, tachycardic Chest - no wheeze, rales Abd - soft, non tender Ext - 1+ edema, left foot amputation, right metatarsal amputation Skin - no rashes Neuro - opens eyes on command, moves lower extremity (L) on command   LABS:  BMET  Recent Labs Lab 02/24/17 0904 02/25/17 0504 02/26/17 0515  NA 136 137 140  K 3.8 4.2 3.9  CL 97* 98* 100*  CO2 27 28 26    BUN 87* 56* 71*  CREATININE 4.65* 3.53* 4.88*  GLUCOSE 176* 137* 149*    Electrolytes  Recent Labs Lab 02/22/17 0430  02/24/17 0545 02/24/17 0904 02/25/17 0504 02/26/17 0515  CALCIUM 9.2  < > 9.2 9.4 8.8* 8.9  PHOS 2.7  --  2.6 2.8  --   --   < > = values in this interval not displayed.  CBC  Recent Labs Lab 02/24/17 0904 02/25/17 0504 02/26/17 0515  WBC 20.6* 20.2* 22.2*  HGB 8.6* 8.6* 8.3*  HCT 28.5* 28.3* 27.6*  PLT 436* 511* 566*    Coag's  Recent Labs Lab 02/21/17 0423  INR 1.27    Sepsis Markers No results for input(s): LATICACIDVEN, PROCALCITON, O2SATVEN in the last 168 hours.  ABG No results for input(s): PHART, PCO2ART, PO2ART in the last 168 hours.  Liver Enzymes  Recent Labs Lab 02/22/17 0430 02/23/17 0325 02/24/17 0545 02/24/17 0904 02/26/17 0515  AST 39 35  --   --  25  ALT 19 20  --   --  12*  ALKPHOS 541* 574*  --   --  558*  BILITOT 8.3* 6.5*  --   --  3.5*  ALBUMIN 1.6*  1.7* 1.9* 1.8* 1.8* 1.7*    Cardiac Enzymes No results for input(s): TROPONINI, PROBNP in the last 168 hours.  Glucose  Recent Labs Lab 02/25/17 0854 02/25/17 1233  02/25/17 1641 02/25/17 1924 02/25/17 2347 02/26/17 0349  GLUCAP 156* 148* 167* 134* 152* 154*   STUDIES:  Lower extremity ultrasound 5/18 >> no evidence of DVT CT head 5/19 >> normal exam Echo 5/20 >> EF 30-35%, increased LVF. Diffuse hypokinesis, akinesis of basilar mid-inferior myocardium, grade 2 diastolic dysfxn  EEG 5/20 >> finding c/w mod to severe global cerebral dysfxn. C/w anoxic injury given clinical course  LE [place] 5/21 >> negative MRI brain 5/24 >> ?thrombosed cortical vein, otherwise normal  CULTURES: Bcx 5/19 X 2 >> negative  ANTIBIOTICS: Clinidamycin 5/19 >> 5/21 Vancomycin 5/19 >> 5/25 >> restart 5/30 Zosyn 5/19 >> 5/22 Meropenem 5/22 >> 5/30 Flagyl 5/30>> Ceftriaxone 5/30 >>  SIGNIFICANT EVENTS: 5/19 Admit, cardiac arrest, transfer to ICU; hypothermia protocol  started 5/24 off pressors.  MRI w/out evidence of ischemia   LINES/TUBES: ETT 5/19 >> Lt IJ CVL 5/19 >>  ASSESSMENT / PLAN: Pulmonary: Acute respiratory failure after cardiac arrest with pulmonary edema. - mental status barrier to extubation trial - plan for tracheostomy today   Cardiac: Asystolic cardiac arrest. 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 Hx of HLD. - coreg - hold isosorbide  Renal: ESRD Hypokalemia - resolved  - HD per renal  GI: Jaundice from shock liver with elevated LFTs. - resolved Moderate protein calorie malnutrition. - tube feeds  Hematology: Anemia of critical illness and chronic disease. - f/u CBC - aranesp  Infectious: Lt foot osteomyelitis. Per ID recommend total 10 days of antibiotics.  De-escalate to ceftriaxone, flagyl plus vancomycin. - antibiotics as above with stop date of 6/9  Endocrine: DM type II. - SSI  Neurological: Acute metabolic encephalopathy. Anoxic encephalopathy after cardiac arrest. - prognosis is dismal, patient's mental status is improving slowly  Hx of DM neuropathy. - monitor mental status - hold outpt cymbalta, lyrica  DVT prophylaxis - SQ heparin SUP - protonix Nutrition - tube feeds Goals of care - full code  -[name] PGY-1  STAFF NOTE: Cindi Carbon, MD FACP have personally reviewed patient's available data, including medical history, events of note, physical examination and test results as part of my evaluation. I have discussed with resident/NP and other care providers such as pharmacist, RN and RRT. In addition, I personally evaluated patient and elicited key findings of: awake, not following commands ot me, but resident reports wiggled toes likely, tracks eyes,she is clearly more awake fullness compared to early in weak, crackles lungs, pcxr with int edema, pos baalnce, will need HD today, for trach given neuro progress makes sense, continued flagyl / ctx for osteo, she is weak,  will need Beth, weanign this am cpap 5 ps 15 required - trach needed, I updated mom in full The patient is critically ill with multiple organ systems failure and requires high complexity decision making for assessment and support, frequent evaluation and titration of therapies, application of advanced monitoring technologies and extensive interpretation of multiple databases.   Critical Care Time devoted to patient care services described in this note is30 Minutes. This time reflects time of care of this signee: Rory Percy, MD FACP. This critical care time does not reflect procedure time, or teaching time or supervisory time of PA/NP/Med student/Med Resident etc but could involve care discussion time. Rest per NP/medical resident whose note is outlined above and that I agree with   Mcarthur Rossetti. Tyson Alias, MD, FACP Pgr: [phone] [facility] 02/26/2017 9:19 AM

## 2017-02-27 ENCOUNTER — Inpatient Hospital Stay (HOSPITAL_COMMUNITY): Payer: Medicaid Other

## 2017-02-27 ENCOUNTER — Telehealth (INDEPENDENT_AMBULATORY_CARE_PROVIDER_SITE_OTHER): Payer: Self-pay | Admitting: Orthopedic Surgery

## 2017-02-27 DIAGNOSIS — R011 Cardiac murmur, unspecified: Secondary | ICD-10-CM

## 2017-02-27 LAB — GLUCOSE, CAPILLARY
GLUCOSE-CAPILLARY: 112 mg/dL — AB (ref 65–99)
GLUCOSE-CAPILLARY: 128 mg/dL — AB (ref 65–99)
GLUCOSE-CAPILLARY: 140 mg/dL — AB (ref 65–99)
GLUCOSE-CAPILLARY: 162 mg/dL — AB (ref 65–99)
Glucose-Capillary: 100 mg/dL — ABNORMAL HIGH (ref 65–99)
Glucose-Capillary: 140 mg/dL — ABNORMAL HIGH (ref 65–99)

## 2017-02-27 LAB — BASIC METABOLIC PANEL
ANION GAP: 14 (ref 5–15)
BUN: 39 mg/dL — ABNORMAL HIGH (ref 6–20)
CO2: 27 mmol/L (ref 22–32)
Calcium: 8.2 mg/dL — ABNORMAL LOW (ref 8.9–10.3)
Chloride: 97 mmol/L — ABNORMAL LOW (ref 101–111)
Creatinine, Ser: 3.59 mg/dL — ABNORMAL HIGH (ref 0.44–1.00)
GFR calc Af Amer: 18 mL/min — ABNORMAL LOW (ref >60)
GFR calc non Af Amer: 15 mL/min — ABNORMAL LOW (ref >60)
GLUCOSE: 108 mg/dL — AB (ref 65–99)
POTASSIUM: 3.2 mmol/L — AB (ref 3.5–5.1)
Sodium: 138 mmol/L (ref 135–145)

## 2017-02-27 LAB — C DIFFICILE QUICK SCREEN W PCR REFLEX
C DIFFICILE (CDIFF) TOXIN: NEGATIVE
C Diff antigen: NEGATIVE
C Diff interpretation: NOT DETECTED

## 2017-02-27 LAB — CBC
HEMATOCRIT: 27.6 % — AB (ref 36.0–46.0)
Hemoglobin: 8.3 g/dL — ABNORMAL LOW (ref 12.0–15.0)
MCH: 28.9 pg (ref 26.0–34.0)
MCHC: 30.1 g/dL (ref 30.0–36.0)
MCV: 96.2 fL (ref 78.0–100.0)
Platelets: 593 10*3/uL — ABNORMAL HIGH (ref 150–400)
RBC: 2.87 MIL/uL — AB (ref 3.87–5.11)
RDW: 16.7 % — ABNORMAL HIGH (ref 11.5–15.5)
WBC: 21.1 10*3/uL — AB (ref 4.0–10.5)

## 2017-02-27 MED ORDER — ACETAMINOPHEN 10 MG/ML IV SOLN
1000.0000 mg | Freq: Once | INTRAVENOUS | Status: AC
  Administered 2017-02-27: 1000 mg via INTRAVENOUS
  Filled 2017-02-27: qty 100

## 2017-02-27 MED ORDER — POTASSIUM CHLORIDE 20 MEQ/15ML (10%) PO SOLN
20.0000 meq | Freq: Once | ORAL | Status: AC
  Administered 2017-02-27: 20 meq via ORAL
  Filled 2017-02-27: qty 15

## 2017-02-27 MED ORDER — METOPROLOL SUCCINATE ER 25 MG PO TB24
12.5000 mg | ORAL_TABLET | Freq: Every day | ORAL | Status: DC
  Administered 2017-02-28 – 2017-03-07 (×5): 12.5 mg via ORAL
  Filled 2017-02-27 (×9): qty 1

## 2017-02-27 MED ORDER — DARBEPOETIN ALFA 200 MCG/0.4ML IJ SOSY
200.0000 ug | PREFILLED_SYRINGE | INTRAMUSCULAR | Status: DC
  Administered 2017-03-03 – 2017-03-17 (×3): 200 ug via INTRAVENOUS
  Filled 2017-02-27 (×4): qty 0.4

## 2017-02-27 MED ORDER — CHLORHEXIDINE GLUCONATE 0.12% ORAL RINSE (MEDLINE KIT)
15.0000 mL | Freq: Two times a day (BID) | OROMUCOSAL | Status: DC
  Administered 2017-02-27 – 2017-04-13 (×81): 15 mL via OROMUCOSAL

## 2017-02-27 MED ORDER — RENA-VITE PO TABS
1.0000 | ORAL_TABLET | Freq: Every day | ORAL | Status: DC
  Administered 2017-02-27 – 2017-04-29 (×61): 1 via ORAL
  Filled 2017-02-27 (×62): qty 1

## 2017-02-27 MED ORDER — ORAL CARE MOUTH RINSE
15.0000 mL | Freq: Four times a day (QID) | OROMUCOSAL | Status: DC
  Administered 2017-02-27 – 2017-04-13 (×147): 15 mL via OROMUCOSAL

## 2017-02-27 NOTE — Progress Notes (Signed)
[facility] for Infectious Disease    Date of Admission:  02/14/2017   Total days of antibiotics 12 Day 3 Ceftriaxone + metronidazole + vancomycin    ID: [name] is a 36 y.o. female with IDDM, ESRD on HD, hx of diabetic foot ulcer s/p right TM amputation and most recently L TM amputation in Jan 2018, and revision of amputation site on 4/25. Trach placed 5/31.   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 Not following commands today. Making eye contact. Mom is at the bedside.   Medications:  . calcium carbonate (dosed in mg elemental calcium)  2,000 mg of elemental calcium Oral TID  . chlorhexidine gluconate (MEDLINE KIT)  15 mL Mouth Rinse BID  . Chlorhexidine Gluconate Cloth  6 each Topical Q0600  . [START ON 03/03/2017] darbepoetin (ARANESP) injection - DIALYSIS  200 mcg Intravenous Q Tue-HD  . feeding supplement (NEPRO CARB STEADY)  1,000 mL Per Tube Q24H  . feeding supplement (PRO-STAT SUGAR FREE 64)  60 mL Per Tube QID  . [name]'s butt cream   Topical BID  . heparin  5,000 Units Subcutaneous Q8H  . insulin aspart  0-15 Units Subcutaneous Q4H  . mouth rinse  15 mL Mouth Rinse QID  . metoprolol succinate  12.5 mg Oral QHS  . multivitamin  1 tablet Oral QHS  . pantoprazole sodium  40 mg Per Tube Q24H  . potassium chloride  20 mEq Oral  Once  . sodium chloride flush  10-40 mL Intracatheter Q12H  . vecuronium  10 mg Intravenous Once    Objective: Vital signs in last 24 hours: Temp:  [98.5 F (36.9 C)-103.1 F (39.5 C)] 100.3 F (37.9 C) (06/01 0809) Pulse Rate:  [79-117] 103 (06/01 0800) Resp:  [7-34] 11 (06/01 0800) BP: (102-141)/(63-106) 120/71 (06/01 0800) SpO2:  [89 %-100 %] 98 % (06/01 0813) FiO2 (%):  [30 %-70 %] 30 % (06/01 0813) Weight:  [233 lb 7.5 oz (105.9 kg)-240 lb 11.9 oz (109.2 kg)] 233 lb 7.5 oz (105.9 kg) (06/01 0500)  Constitutional: Awake with eyes open upon walking into room. Appears more comfortable with trach in place.  HENT: PERRLA, no scleral icterus Mouth/Throat: Oropharynx is clear and moist. No oropharyngeal exudate.  Cardiovascular: S1/S2, tachycardic. New systolic/diastolic murmur LSB to [place].  Pulmonary/Chest: Tachypneic during exam. Bilateral rhonchi.  Neck = supple, no nuchal rigidity, good ROM Abdominal: Soft. Bowel sounds are normal. No distension. There is no tenderness. Diarrhea noted.  Lymphadenopathy: no cervical adenopathy. No axillary adenopathy Neurologic: Eyes open and awake. Not following commands today.  Skin: Skin is warm and dry. Scattered fluid blisters on posterior right thigh/calf.   Right TM is clean and well healed.   Left TM dressing with small drainage. Dressings are being done once a day.   Lab Results  Recent Labs  02/26/17 0515 02/27/17 0349  WBC 22.2*  21.1*  HGB 8.3* 8.3*  HCT 27.6* 27.6*  NA 140 138  K 3.9 3.2*  CL 100* 97*  CO2 26 27  BUN 71* 39*  CREATININE 4.88* 3.59*   Liver Panel  Recent Labs  02/26/17 0515  PROT 5.7*  ALBUMIN 1.7*  AST 25  ALT 12*  ALKPHOS 558*  BILITOT 3.5*  BILIDIR 1.8*  IBILI 1.7*   Sedimentation Rate No results for input(s): ESRSEDRATE in the last 72 hours. C-Reactive Protein No results for input(s): CRP in the last 72 hours.  Microbiology:  Studies/Results: Dg Chest Port 1 View  Result Date:  02/27/2017 CLINICAL DATA:  Hypoxia. EXAM: PORTABLE CHEST 1 VIEW COMPARISON:  02/26/2017. FINDINGS: Tracheostomy to in stable position. Stable cardiomegaly. Low lung volumes. Progressive left lower lobe atelectasis and infiltrate. Small left pleural effusion cannot be excluded . IMPRESSION: 1. Tracheostomy tube in stable position. 2. Progressive left lower lobe atelectasis and infiltrate. Small left pleural effusion cannot be excluded . Electronically Signed   By: Marcello Moores  Register   On: 02/27/2017 07:01   Dg Chest Port 1 View  Result Date: 02/26/2017 CLINICAL DATA:  Status post tracheostomy placement. EXAM: PORTABLE CHEST 1 VIEW COMPARISON:  Earlier today. FINDINGS: The endotracheal tube has been removed and replaced with a tracheostomy tube in satisfactory position. The nasogastric tube and esophageal probe have been removed. The left jugular catheter has been removed. No pneumothorax. Grossly stable enlarged cardiac silhouette. Left lower lobe opacity with improvement laterally since 02/25/2017. Clear right lung. Unremarkable bones. IMPRESSION: 1. Tracheostomy tube in satisfactory position. 2. Left lower lobe atelectasis or pneumonia with mild improvement since 2 days ago. 3. Stable cardiomegaly. Electronically Signed   By: Claudie Revering M.D.   On: 02/26/2017 16:02   Dg Chest Port 1 View  Result Date: 02/26/2017 CLINICAL DATA:  Hypoxia.  Shortness of breath. EXAM: PORTABLE CHEST 1 VIEW COMPARISON:  02/25/2017. FINDINGS: Endotracheal tube, NG tube, left IJ line esophageal probe in stable position. Cardiomegaly with bilateral pulmonary interstitial infiltrates consistent with CHF. Left base atelectasis . Small left pleural effusion. Similar findings noted on prior exam. No pneumothorax. IMPRESSION: 1. Lines and tubes in stable position. 2. Cardiomegaly with bilateral pulmonary interstitial prominence and small left pleural effusion noted consistent CHF. Left base atelectasis. Similar findings noted on prior  exam. 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 Still purulent, brown foul drainage. From what I understand Dr. Sharol Given has apparently been recommending amputation for some time now but patient has been resistant. He can come see her for reassessment next week.   Fevers =  - Tmax 103.1 last night, WBC 20.2 >> 22.2 >> 21 - Central fevers possibly with brain injury, but WBC continues to be elevated.  - Foot infection with evidence of bone involvement on xrays so could be the ongoing cause - TEE to r/o IE  - Repeat BCx today   Ventilatory Failure = - Trach in place - Weaning now, 30% FiO2 - Progressive LLL atx and infiltrate on CXR today. Possible effusion with new onset murmur  Heart Murmur, New =  - TEE ordered with persistent leukocytosis and fevers   No improvement in status of fevers or leukocytosis. New murmur noted today. She does not have PV; await TEE results for now. Dr. Lake Bells spoke with Dr. Sharol Given today - apparently he has been recommending amputation for a while but Ms. Eisenhuth has been adamantly resistant in the past. Difficult situation now that her neurocognitive status is impaired regarding further consent. He will see her over the weekend.  LOT - pending assessment from ortho team and TEE results  Janene Madeira, MSN, NP-C [facility] for Infectious [place] Cell: [phone] Pager: [phone]  02/27/2017, 9:42 AM

## 2017-02-27 NOTE — Progress Notes (Signed)
eLink Physician-Brief Progress Note Patient Name: [name] DOB: 03-Jun-1981 MRN: [id number]   Date of Service  02/27/2017  HPI/Events of Note  Patient febrile to 101.7 F. No gastric tube. Scheduled for CorTrak in AM. Patient has Flexiseal, therefore, can't get rectal Tylenol.   eICU Interventions  Will order: 1. Tylenol 1000 mg IV X 1.   Resume Tylenol per tube post CorTrak placement in AM.     Intervention Category Major Interventions: Infection - evaluation and management  Sommer,Steven Eugene 02/27/2017, 12:45 AM

## 2017-02-27 NOTE — Progress Notes (Signed)
    [facility] has been requested to perform a transesophageal echocardiogram on 03/02/17 for bacteremia.  After careful review of history and examination, the risks and benefits of transesophageal echocardiogram have been explained including risks of esophageal damage, perforation (1:10,000 risk), bleeding, pharyngeal hematoma as well as other potential complications associated with conscious sedation including aspiration, arrhythmia, respiratory failure and death. Alternatives to treatment were discussed, questions were answered for the patient's mother. Patient mother [name] is willing to proceed.   Vital signs are stable without the use of pressers. Hgb stable at 8.3, of note platelets 593,000.  Laverda Page, NP-C 02/27/2017 3:44 PM

## 2017-02-27 NOTE — Clinical Social Work Note (Addendum)
CSW got a consult for Vent SNF placement. CSW will begin Vent SNF search. CSW following for final dispo plan. Per RN in progression Ortho to see on Sunday.  [place], [place] [phone]

## 2017-02-27 NOTE — Progress Notes (Signed)
PULMONARY / CRITICAL CARE MEDICINE   Name: [name] MRN: [id number] DOB: 12-19-1980    ADMISSION DATE:  02/14/2017 CONSULTATION DATE:  02/14/2017  REFERRING MD:  Feliz Beam MD  CHIEF COMPLAINT:  Sepsis, cardiac arrest  HISTORY OF PRESENT ILLNESS:   36 year old female w/ ESRD d/t poorly controlled DM, bilateral transmetatarsal foot amputations, obesity, HTN, medical non-compliance as well as NICM. Admitted 5/19 with complaints of left leg pain, found to have radiological evidence of osteomyelitis with wound nonhealing, dehiscence, purulent drainage from the wound. Admitted for IV antibiotics, hemodialysis and possible surgery.  Found unresponsive in the room on 5/19. She had chest compressions, epi bicarb given. She was intubated by anesthesia and had ROSC in approximately 9 minutes. Downtime prior to code is unknown. She got dilaudid in the afternoon for leg pain.  SUBJECTIVE:  Febrile to 103, remains on broad spectrum antibiotics Renal notes new murmur Nursing notes loose stools  VITAL SIGNS: BP 120/71 (BP Location: Right Arm)   Pulse (!) 103   Temp 100.3 F (37.9 C) (Oral)   Resp 11   Ht 5' 8.5" (1.74 m)   Wt 105.9 kg (233 lb 7.5 oz)   LMP  (LMP Unknown) Comment: Patient trached  SpO2 98%   BMI 34.98 kg/m   VENTILATOR SETTINGS: Vent Mode: PRVC FiO2 (%):  [30 %-70 %] 30 % Set Rate:  [16 bmp-18 bmp] 16 bmp Vt Set:  [520 mL] 520 mL PEEP:  [5 cmH20] 5 cmH20 Plateau Pressure:  [19 cmH20-27 cmH20] 22 cmH20  INTAKE / OUTPUT: I/O last 3 completed shifts: In: 1000 [I.V.:470; NG/GT:80; IV Piggyback:450] Out: 3550 [Other:3000; Stool:550]  General:  Resting comfortably in bed HENT: NCAT Trach in place PULM: Rhonchi on left B, ventilator supported breathing CV: RRR, loud systolic murmur GI: BS+, soft, nontender MSK: normal bulk and tone Neuro: awake, alert, no distress, MAEW   LABS:  BMET  Recent Labs Lab 02/25/17 0504 02/26/17 0515 02/27/17 0349  NA 137 140 138   K 4.2 3.9 3.2*  CL 98* 100* 97*  CO2 28 26 27   BUN 56* 71* 39*  CREATININE 3.53* 4.88* 3.59*  GLUCOSE 137* 149* 108*    Electrolytes  Recent Labs Lab 02/22/17 0430  02/24/17 0545 02/24/17 0904 02/25/17 0504 02/26/17 0515 02/27/17 0349  CALCIUM 9.2  < > 9.2 9.4 8.8* 8.9 8.2*  PHOS 2.7  --  2.6 2.8  --   --   --   < > = values in this interval not displayed.  CBC  Recent Labs Lab 02/25/17 0504 02/26/17 0515 02/27/17 0349  WBC 20.2* 22.2* 21.1*  HGB 8.6* 8.3* 8.3*  HCT 28.3* 27.6* 27.6*  PLT 511* 566* 593*    Coag's  Recent Labs Lab 02/21/17 0423  INR 1.27    Sepsis Markers No results for input(s): LATICACIDVEN, PROCALCITON, O2SATVEN in the last 168 hours.  ABG No results for input(s): PHART, PCO2ART, PO2ART in the last 168 hours.  Liver Enzymes  Recent Labs Lab 02/22/17 0430 02/23/17 0325 02/24/17 0545 02/24/17 0904 02/26/17 0515  AST 39 35  --   --  25  ALT 19 20  --   --  12*  ALKPHOS 541* 574*  --   --  558*  BILITOT 8.3* 6.5*  --   --  3.5*  ALBUMIN 1.6*  1.7* 1.9* 1.8* 1.8* 1.7*    Cardiac Enzymes No results for input(s): TROPONINI, PROBNP in the last 168 hours.  Glucose  Recent Labs  Lab 02/26/17 1658 02/26/17 1938 02/26/17 2107 02/27/17 0001 02/27/17 0341 02/27/17 0806  GLUCAP 116* 97 92 103* 100* 112*   STUDIES:  Lower extremity ultrasound 5/18 >> no evidence of DVT CT head 5/19 >> normal exam Echo 5/20 >> EF 30-35%, increased LVF. Diffuse hypokinesis, akinesis of basilar mid-inferior myocardium, grade 2 diastolic dysfxn  EEG 5/20 >> finding c/w mod to severe global cerebral dysfxn. C/w anoxic injury given clinical course  LE [place] 5/21 >> negative MRI brain 5/24 >> ?thrombosed cortical vein, otherwise normal  CULTURES: Bcx 5/19 X 2 >> negative Blood cultures 6/1 >   ANTIBIOTICS: Clinidamycin 5/19 >> 5/21 Vancomycin 5/19 >> 5/25 >> restart 5/30 Zosyn 5/19 >> 5/22 Meropenem 5/22 >> 5/30 Flagyl 5/30>> Ceftriaxone  5/30 >>  SIGNIFICANT EVENTS: 5/19 Admit, cardiac arrest, transfer to ICU; hypothermia protocol started 5/24 off pressors.  MRI w/out evidence of ischemia   LINES/TUBES: ETT 5/19 >> Lt IJ CVL 5/19 >>  ASSESSMENT / PLAN: Pulmonary: Acute respiratory failure after cardiac arrest with pulmonary edema. S/P tracheostomy - continue tracheostomy care - ATC trial today   Cardiac: Asystolic cardiac arrest. 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 Hx of HLD. New murmur 6/1 - request TEE given rising WBC - coreg - hold isosorbide  Renal: ESRD Hypokalemia - repleated today - HD per renal  GI: Moderate protein calorie malnutrition. Diarrhea - tube feeds - check c diff today  Hematology Anemia of critical illness and chronic disease. - f/u CBC - aranesp  Infectious: Lt foot osteomyelitis. Per ID recommend total 10 days of antibiotics.  Continue ceftriaxone, flagyl plus vancomycin. New Murmur Diarrhea - check c diff - check TEE - ortho willing to perform BKA if patient gives consent   Endocrine: DM type II. 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  Hx of DM neuropathy. - monitor mental status - hold outpt cymbalta, lyrica - hold sedatives as able  DVT prophylaxis - SQ heparin SUP - protonix Nutrition - tube feeds Goals of care - full code  My cc time 33 minutes  Mother updated bedside  Heber Cayuco, MD [facility] Pager: [phone] Cell: [phone] After 3pm or if no response, call [phone]

## 2017-02-27 NOTE — Telephone Encounter (Signed)
02/12/2017 OV NOTE FAXED TO [facility] IN RESPONSE TO A FAX FROM 12/31/2016 TO SUPPORT CONTINUED NEED FOR A WHEELCHAIR, PER DR DUDA

## 2017-02-27 NOTE — Progress Notes (Addendum)
2120: Spoke with pharmacist regarding Metoprolol XL ordered with instructions not to crush medication. Pt is NPO with Cortrak in place. Pharmacist recommended contacting physician for possible lower dose Metoprolol IR solution given throughout the day.  2130: Dr Deterding paged and informed of medication not to be crushed. Pharmacist recommendations relayed to Md. He stated there was no lower dose than what is ordered and does not want to give multiple IR doses throughout day. When asked what he would like to do with this medication he replied "just stop it". Will hold  Medication until further notice.

## 2017-02-27 NOTE — Progress Notes (Signed)
Cortrak Tube Team Note:  Consult received to place a Cortrak feeding tube.   A 10 F Cortrak tube was placed in the R nare and secured with a nasal bridle at 83 cm. Per the Cortrak monitor reading the tube tip is post-pyloric.   No x-ray is required. RN may begin using tube.    If the tube becomes dislodged please keep the tube and contact the Cortrak team at [email or web address] (password [email or web address]) for replacement.  If after hours and replacement cannot be delayed, place a NG tube and confirm placement with an abdominal x-ray.      Trenton Gammon, MS, RD, LDN, [facility] Inpatient Clinical Dietitian Pager # [phone] After hours/weekend pager # [phone]

## 2017-02-27 NOTE — NC FL2 (Signed)
[place] MEDICAID FL2 LEVEL OF CARE SCREENING TOOL     IDENTIFICATION  Patient Name: [name] Birthdate: Apr 23, 1981 Sex: female Admission Date (Current Location): 02/14/2017  [facility] and [place] Number:  Herbalist and Address:  [facility] [place] [place] [address], [place], [place] [address]      Provider Number: [id number]  Attending Physician Name and Address:  Marshell Garfinkel, MD  Relative Name and Phone Number:       Current Level of Care: Hospital Recommended Level of Care: [place] Prior Approval Number:    Date Approved/Denied:   PASRR Number:  [id number]   Discharge Plan: SNF    Current Diagnoses: Patient Active Problem List   Diagnosis Date Noted  . Encounter for central line placement   . Elevated liver enzymes   . Jaundice   . Wound dehiscence   . Coma ([place])   . Osteomyelitis ([place]) 02/14/2017  . Pressure injury of skin 02/14/2017  . Cardiac arrest, cause unspecified ([place])   . Otogenic vertigo 01/30/2017  . Dehiscence of amputation stump ([place])   . Status post transmetatarsal amputation of left foot ([place]) 10/21/2016  . Status post transmetatarsal amputation of right foot ([place]) 10/08/2016  . 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 Diabetic osteomyelitis ([place])   . Type 1 diabetes mellitus with nephropathy ([place])   . Left foot infection 06/20/2016  . ESRD (end stage renal disease) ([place]) 06/20/2016  . Diabetic foot ulcer ([place]) 06/20/2016  . Chronic pain syndrome 02/24/2016  . Pre-operative clearance 10/12/2015  . Normal coronary arteries 10/12/2015  . NSTEMI- type 2, Troponin 11.2 05/11/2015  . Acute respiratory failure ([place]) 05/10/2015  . History of arthroscopy of right shoulder-05/10/15 05/10/2015  . CKD (chronic kidney disease) stage 5, GFR less than 15 ml/min (HCC) 09/21/2014  . Unspecified asthma(493.90) 10/25/2013  . Acute combined systolic and  diastolic heart failure ([place]) 09/21/2013  . Non-ischemic cardiomyopathy - by echo 8/16- EF 35-40% 09/16/2013  . Morbid obesity-  09/16/2013  . Uncontrolled type 2 diabetes with renal manifestation ([place]) 09/15/2013  . Normocytic anemia 09/15/2013  . Lower extremity edema 09/15/2013  . Hyperlipidemia   . Hypertension     Orientation RESPIRATION BLADDER Height & Weight      (Intubated/Tracheostomy)  Vent (30%) Incontinent Weight: 233 lb 7.5 oz (105.9 kg) Height:  5' 8.5" (174 cm)  BEHAVIORAL SYMPTOMS/MOOD NEUROLOGICAL BOWEL NUTRITION STATUS      Incontinent (Rectal tube placed 5/25) Feeding tube (Cortrak feeding tube )  AMBULATORY STATUS COMMUNICATION OF NEEDS Skin   Total Care   PU Stage and Appropriate Care, Surgical wounds, Skin abrasions (Closed incision, diabetic ulcer, left foot/heel, guaze dressing, change daily. )   PU Stage 2 Dressing:  (Anus, Medial; Lateral; Circumferential, Foam dressing, PRN)                   Personal Care Assistance Level of Assistance  Bathing, Feeding, Dressing (Intubated/Tracheostomy) Bathing Assistance: Maximum assistance Feeding assistance: Maximum assistance Dressing Assistance: Maximum assistance     Functional Limitations Info  Sight, Hearing, Speech Sight Info: Adequate Hearing Info: Adequate Speech Info:  (Intubated/Tracheostomy)    SPECIAL CARE FACTORS FREQUENCY                       Contractures Contractures Info: Not present    Additional Factors Info  Code Status, Allergies Code Status Info: Full Code Allergies Info: Aspirin,  Sulfur, Tramadol, Gabapentin           Current Medications (02/27/2017):  This is the current hospital active medication list Current Facility-Administered Medications  Medication Dose Route Frequency Provider Last Rate Last Dose  . 0.9 %  sodium chloride infusion   Intravenous Continuous Erick Colace, NP 10 mL/hr at 02/27/17 0700    . 0.9 %  sodium chloride infusion  100 mL Intravenous  PRN Estanislado Emms, MD      . 0.9 %  sodium chloride infusion  100 mL Intravenous PRN Estanislado Emms, MD      . acetaminophen (TYLENOL) solution 650 mg  650 mg Per Tube Q6H PRN Jamal Maes, MD   650 mg at 02/26/17 0044  . albuterol (PROVENTIL) (2.5 MG/3ML) 0.083% nebulizer solution 2.5 mg  2.5 mg Inhalation Q6H PRN Sharene Butters E, PA-C   2.5 mg at 02/27/17 0815  . alteplase (CATHFLO ACTIVASE) injection 2 mg  2 mg Intracatheter Once PRN Estanislado Emms, MD      . calcium carbonate (dosed in mg elemental calcium) suspension 2,000 mg of elemental calcium  2,000 mg of elemental calcium Oral TID Jamal Maes, MD   2,000 mg of elemental calcium at 02/25/17 1648  . cefTRIAXone (ROCEPHIN) 2 g in dextrose 5 % 50 mL IVPB  2 g Intravenous Q24H Valinda Party, DO   Stopped at 02/26/17 [phone]  . chlorhexidine gluconate (MEDLINE KIT) (PERIDEX) 0.12 % solution 15 mL  15 mL Mouth Rinse BID Mannam, Praveen, MD      . Chlorhexidine Gluconate Cloth 2 % PADS 6 each  6 each Topical Q0600 Rigoberto Noel, MD   6 each at 02/26/17 0600  . [START ON 03/03/2017] Darbepoetin Alfa (ARANESP) injection 200 mcg  200 mcg Intravenous Q Tue-HD Deterding, Jeneen Rinks, MD      . feeding supplement (NEPRO CARB STEADY) liquid 1,000 mL  1,000 mL Per Tube Q24H Mannam, Praveen, MD   Stopped at 02/26/17 0200  . feeding supplement (PRO-STAT SUGAR FREE 64) liquid 60 mL  60 mL Per Tube QID Mannam, Praveen, MD   60 mL at 02/25/17 1400  . fentaNYL (SUBLIMAZE) injection 25-100 mcg  25-100 mcg Intravenous Q2H PRN Brand Males, MD   50 mcg at 02/25/17 0130  . fentaNYL (SUBLIMAZE) injection 25-50 mcg  25-50 mcg Intravenous Q2H PRN Raylene Miyamoto, MD      . [name]'s butt cream   Topical BID Brand Males, MD      . heparin injection 1,000 Units  1,000 Units Dialysis PRN Estanislado Emms, MD      . heparin injection 2,200 Units  20 Units/kg Dialysis PRN Estanislado Emms, MD   2,200 Units at 02/24/17 1213  . heparin injection  5,000 Units  5,000 Units Subcutaneous Q8H Rondel Jumbo, PA-C   5,000 Units at 02/27/17 0546  . insulin aspart (novoLOG) injection 0-15 Units  0-15 Units Subcutaneous Q4H Parrett, Tammy S, NP   3 Units at 02/26/17 1241  . lidocaine (PF) (XYLOCAINE) 1 % injection 5 mL  5 mL Intradermal PRN Estanislado Emms, MD      . lidocaine-prilocaine (EMLA) cream 1 application  1 application Topical PRN Estanislado Emms, MD      . MEDLINE mouth rinse  15 mL Mouth Rinse QID Mannam, Praveen, MD      . metoprolol succinate (TOPROL-XL) 24 hr tablet 12.5 mg  12.5 mg Oral QHS Mauricia Area, MD      .  metroNIDAZOLE (FLAGYL) IVPB 500 mg  500 mg Intravenous Q8H Hoffman, Jessica Ratliff, DO   Stopped at 02/27/17 0126  . multivitamin (RENA-VIT) tablet 1 tablet  1 tablet Oral QHS Deterding, Jeneen Rinks, MD      . pantoprazole sodium (PROTONIX) 40 mg/20 mL oral suspension 40 mg  40 mg Per Tube Q24H Chesley Mires, MD   40 mg at 02/25/17 1139  . pentafluoroprop-tetrafluoroeth (GEBAUERS) aerosol 1 application  1 application Topical PRN Estanislado Emms, MD      . potassium chloride 20 MEQ/15ML (10%) solution 20 mEq  20 mEq Oral Once Deterding, Jeneen Rinks, MD      . sodium chloride flush (NS) 0.9 % injection 10-40 mL  10-40 mL Intracatheter Q12H Mannam, Praveen, MD   10 mL at 02/26/17 2122  . sodium chloride flush (NS) 0.9 % injection 10-40 mL  10-40 mL Intracatheter PRN Mannam, Praveen, MD      . vancomycin (VANCOCIN) IVPB 1000 mg/200 mL premix  1,000 mg Intravenous Q T,Th,Sa-HD Marshell Garfinkel, MD   Stopped at 02/26/17 1300  . vecuronium (NORCURON) injection 10 mg  10 mg Intravenous Once Valinda Party, DO         Discharge Medications: Please see discharge summary for a list of discharge medications.  Relevant Imaging Results:  Relevant Lab Results:   Additional Information SSN: [id number]  Eileen Stanford, LCSW

## 2017-02-27 NOTE — Progress Notes (Signed)
Subjective: Interval History: opens eyes, and tracks , trach on vent  Objective: Vital signs in last 24 hours: Temp:  [98.5 F (36.9 C)-103.1 F (39.5 C)] 100.2 F (37.9 C) (06/01 0343) Pulse Rate:  [79-117] 85 (06/01 0600) Resp:  [13-34] 18 (06/01 0600) BP: (102-141)/(63-106) 114/63 (06/01 0600) SpO2:  [89 %-100 %] 98 % (06/01 0600) FiO2 (%):  [30 %-70 %] 30 % (06/01 0600) Weight:  [105.9 kg (233 lb 7.5 oz)-109.2 kg (240 lb 11.9 oz)] 105.9 kg (233 lb 7.5 oz) (06/01 0500) Weight change: 0.3 kg (10.6 oz)  Intake/Output from previous day: 05/31 0701 - 06/01 0700 In: 660 [I.V.:210; IV Piggyback:450] Out: 3050 [Stool:50] Intake/Output this shift: No intake/output data recorded.  General appearance: opens eyes, tracks me, , obese, not coop Neck: trach Resp: rales bibasilar and none Cardio: S1, S2 normal, systolic murmur: holosystolic 3/6, blowing at apex, diastolic murmur: holodiastolic 3/6, blowing at apex and NEW MURMUR GI: obese, striae, pos bs Extremities: AVF LUA, boots on feet , dressing on R  Lab Results:  Recent Labs  02/26/17 0515 02/27/17 0349  WBC 22.2* 21.1*  HGB 8.3* 8.3*  HCT 27.6* 27.6*  PLT 566* 593*   BMET:  Recent Labs  02/26/17 0515 02/27/17 0349  NA 140 138  K 3.9 3.2*  CL 100* 97*  CO2 26 27  GLUCOSE 149* 108*  BUN 71* 39*  CREATININE 4.88* 3.59*  CALCIUM 8.9 8.2*   No results for input(s): PTH in the last 72 hours. Iron Studies: No results for input(s): IRON, TIBC, TRANSFERRIN, FERRITIN in the last 72 hours.  Studies/Results: Dg Chest Port 1 View  Result Date: 02/26/2017 CLINICAL DATA:  Status post tracheostomy placement. EXAM: PORTABLE CHEST 1 VIEW COMPARISON:  Earlier today. FINDINGS: The endotracheal tube has been removed and replaced with a tracheostomy tube in satisfactory position. The nasogastric tube and esophageal probe have been removed. The left jugular catheter has been removed. No pneumothorax. Grossly stable enlarged cardiac  silhouette. Left lower lobe opacity with improvement laterally since 02/25/2017. Clear right lung. Unremarkable bones. IMPRESSION: 1. Tracheostomy tube in satisfactory position. 2. Left lower lobe atelectasis or pneumonia with mild improvement since 2 days ago. 3. Stable cardiomegaly. Electronically Signed   By: Beckie Salts M.D.   On: 02/26/2017 16:02   Dg Chest Port 1 View  Result Date: 02/26/2017 CLINICAL DATA:  Hypoxia.  Shortness of breath. EXAM: PORTABLE CHEST 1 VIEW COMPARISON:  02/25/2017. FINDINGS: Endotracheal tube, NG tube, left IJ line esophageal probe in stable position. Cardiomegaly with bilateral pulmonary interstitial infiltrates consistent with CHF. Left base atelectasis . Small left pleural effusion. Similar findings noted on prior exam. No pneumothorax. IMPRESSION: 1. Lines and tubes in stable position. 2. Cardiomegaly with bilateral pulmonary interstitial prominence and small left pleural effusion noted consistent CHF. Left base atelectasis. Similar findings noted on prior exam. Electronically Signed   By: Maisie Fus  Register   On: 02/26/2017 07:18    I have reviewed the patient's current medications.  Assessment/Plan: 1 ESRD for HD TTS 2 Fevers foot vs endocarditis 3 New Murmur  ??infx 4 Cardiac arrest 5 VDRF Neuro cause 6 Obesity 7 DM controlled 8 Foot infection  Needs off 9 anoxic encephalopathy P AB, ??AMP, needs TEE    LOS: 13 days   Latavius Capizzi L 02/27/2017,7:01 AM

## 2017-02-28 LAB — CBC
HCT: 29.9 % — ABNORMAL LOW (ref 36.0–46.0)
Hemoglobin: 9.1 g/dL — ABNORMAL LOW (ref 12.0–15.0)
MCH: 29.4 pg (ref 26.0–34.0)
MCHC: 30.4 g/dL (ref 30.0–36.0)
MCV: 96.5 fL (ref 78.0–100.0)
Platelets: 614 10*3/uL — ABNORMAL HIGH (ref 150–400)
RBC: 3.1 MIL/uL — ABNORMAL LOW (ref 3.87–5.11)
RDW: 16.9 % — ABNORMAL HIGH (ref 11.5–15.5)
WBC: 24.1 10*3/uL — ABNORMAL HIGH (ref 4.0–10.5)

## 2017-02-28 LAB — GLUCOSE, CAPILLARY
GLUCOSE-CAPILLARY: 115 mg/dL — AB (ref 65–99)
GLUCOSE-CAPILLARY: 129 mg/dL — AB (ref 65–99)
GLUCOSE-CAPILLARY: 149 mg/dL — AB (ref 65–99)
GLUCOSE-CAPILLARY: 158 mg/dL — AB (ref 65–99)
Glucose-Capillary: 141 mg/dL — ABNORMAL HIGH (ref 65–99)
Glucose-Capillary: 133 mg/dL — ABNORMAL HIGH (ref 65–99)

## 2017-02-28 LAB — BASIC METABOLIC PANEL
Anion gap: 14 (ref 5–15)
BUN: 60 mg/dL — AB (ref 6–20)
CO2: 24 mmol/L (ref 22–32)
CREATININE: 4.88 mg/dL — AB (ref 0.44–1.00)
Calcium: 8.1 mg/dL — ABNORMAL LOW (ref 8.9–10.3)
Chloride: 95 mmol/L — ABNORMAL LOW (ref 101–111)
GFR calc Af Amer: 12 mL/min — ABNORMAL LOW (ref >60)
GFR calc non Af Amer: 10 mL/min — ABNORMAL LOW (ref >60)
GLUCOSE: 158 mg/dL — AB (ref 65–99)
POTASSIUM: 3.2 mmol/L — AB (ref 3.5–5.1)
SODIUM: 133 mmol/L — AB (ref 135–145)

## 2017-02-28 MED ORDER — ALTEPLASE 2 MG IJ SOLR: 2.0000 mg | Freq: Once | INTRAMUSCULAR | Status: DC | PRN

## 2017-02-28 MED ORDER — POTASSIUM CHLORIDE 20 MEQ/15ML (10%) PO SOLN
20.0000 meq | Freq: Once | ORAL | Status: AC
  Administered 2017-02-28: 20 meq
  Filled 2017-02-28: qty 15

## 2017-02-28 MED ORDER — LIDOCAINE HCL (PF) 1 % IJ SOLN: 5.0000 mL | INTRAMUSCULAR | Status: DC | PRN

## 2017-02-28 MED ORDER — PENTAFLUOROPROP-TETRAFLUOROETH EX AERO: 1.0000 "application " | INHALATION_SPRAY | CUTANEOUS | Status: DC | PRN

## 2017-02-28 MED ORDER — SODIUM CHLORIDE 0.9 % IV SOLN: 100.0000 mL | INTRAVENOUS | Status: DC | PRN

## 2017-02-28 MED ORDER — HEPARIN SODIUM (PORCINE) 1000 UNIT/ML DIALYSIS
100.0000 [IU]/kg | INTRAMUSCULAR | Status: DC | PRN
  Administered 2017-02-28: 10600 [IU] via INTRAVENOUS_CENTRAL
  Filled 2017-02-28 (×2): qty 11

## 2017-02-28 MED ORDER — LIDOCAINE-PRILOCAINE 2.5-2.5 % EX CREA: 1.0000 "application " | TOPICAL_CREAM | CUTANEOUS | Status: DC | PRN

## 2017-02-28 MED ORDER — HEPARIN SODIUM (PORCINE) 1000 UNIT/ML DIALYSIS: 1000.0000 [IU] | INTRAMUSCULAR | Status: DC | PRN

## 2017-02-28 NOTE — Progress Notes (Signed)
Pharmacy Antibiotic Note  [name] is a 36 y.o. female admitted on 02/14/2017 with diabetic foot ulcer s/p right transmetatarsal amputation with revision of site on 4/25.  Pharmacy has been consulted for vancomycin dosing. Patient is also on ceftriaxone and metronidazole. Patient has ESRD on HD (TTS), currently getting HD today. Patient with persistent fevers and elevated WBC, repeat blood cultures collected yesterday. New murmur heard and TEE has been ordered.  Plan:  - Vancomycin 1g IV qTTS post HD - Ceftriaxone 2g IV q24h - Metronidazole 500 mg IV q8h - Monitor renal plans, C&S and duration of therapy - Check vancomycin random as needed  Height: 5' 8.5" (174 cm) Weight: 231 lb 11.3 oz (105.1 kg) IBW/kg (Calculated) : 65.05  Temp (24hrs), Avg:99.7 F (37.6 C), Min:97.1 F (36.2 C), Max:102.6 F (39.2 C)   Recent Labs Lab 02/24/17 0904 02/25/17 0504 02/26/17 0515 02/27/17 0349 02/28/17 0309  WBC 20.6* 20.2* 22.2* 21.1* 24.1*  CREATININE 4.65* 3.53* 4.88* 3.59* 4.88*    Estimated Creatinine Clearance: 20.2 mL/min (A) (by C-G formula based on SCr of 4.88 mg/dL (H)).    Allergies  Allergen Reactions  . Aspirin Anaphylaxis  . Sulfur Hives  . Tramadol Hives and Other (See Comments)    Pt states she feels weird   . Gabapentin Other (See Comments)    Lethargic     Antimicrobials this admission: Vanc 5/19>> 5/25;  5/30 > Zosyn 5/19>> 5/22 Clindamycin 5/19>> 5/21 Meropenem 5/22 >> 5/30 Ceftriax 5/30> Metronid 5/30>  Dose adjustments this admission: 5/25 pre-HD VR: 23 (on 1 g w/ HD)  Microbiology results: 5/19 BCx: neg 5/19:  MRSA PCR: neg 6/1 BCx:  6/1 CDiff: neg  Thank you for allowing pharmacy to be a part of this patient's care.  Casilda Carls, PharmD, BCPS PGY-2 Infectious Diseases Pharmacy Resident Pager: [phone] 02/28/2017 10:42 AM

## 2017-02-28 NOTE — Progress Notes (Signed)
[facility] ADULT ICU REPLACEMENT PROTOCOL FOR AM LAB REPLACEMENT ONLY  The patient does not apply for the [facility] Adult ICU Electrolyte Replacment Protocol based on the criteria listed below:   1. Is GFR >/= 40 ml/min? No.  Patient's GFR today is 12 2. Is urine output >/= 0.5 ml/kg/hr for the last 6 hours? No. Patient's UOP is Anuric ml/kg/hr 3. Is BUN < 60 mg/dL? No.  Patient's BUN today is 60  4. Abnormal electrolyte(s): K - 3.2  eMD notified.  Kailiana Granquist P 02/28/2017 6:20 AM

## 2017-02-28 NOTE — Procedures (Signed)
I was present at this session.  I have reviewed the session itself and made appropriate changes.  Getting water hookup for room.  Use 4 K bath, lower 2.5 L  Kathlynn Swofford L 6/2/20187:11 AM

## 2017-02-28 NOTE — Progress Notes (Signed)
PULMONARY / CRITICAL CARE MEDICINE   Name: [name] MRN: [id number] DOB: [birth year]    ADMISSION DATE:  02/14/2017 CONSULTATION DATE:  02/14/2017  REFERRING MD:  Feliz Beam MD  CHIEF COMPLAINT:  Sepsis, cardiac arrest  HISTORY OF PRESENT ILLNESS:   36 year old female w/ ESRD d/t poorly controlled DM, bilateral transmetatarsal foot amputations, obesity, HTN, medical non-compliance as well as NICM. Admitted 5/19 with complaints of left leg pain, found to have radiological evidence of osteomyelitis with wound nonhealing, dehiscence, purulent drainage from the wound. Admitted for IV antibiotics, hemodialysis and possible surgery.  Found unresponsive in the room on 5/19. She had chest compressions, epi bicarb given. She was intubated by anesthesia and had ROSC in approximately 9 minutes. Downtime prior to code is unknown. She got dilaudid in the afternoon for leg pain.  SUBJECTIVE:  Fever curve better No distress Failed SBT this morning  VITAL SIGNS: BP 116/72 (BP Location: Right Arm)   Pulse (!) 109   Temp 97.4 F (36.3 C) (Oral)   Resp (!) 25   Ht 5' 8.5" (1.74 m)   Wt 102.7 kg (226 lb 6.6 oz)   LMP  (LMP Unknown) Comment: Patient trached  SpO2 100%   BMI 33.93 kg/m   VENTILATOR SETTINGS: Vent Mode: PRVC FiO2 (%):  [30 %] 30 % Set Rate:  [16 bmp] 16 bmp Vt Set:  [520 mL] 520 mL PEEP:  [5 cmH20] 5 cmH20 Pressure Support:  [12 cmH20] 12 cmH20 Plateau Pressure:  [22 cmH20-25 cmH20] 22 cmH20  INTAKE / OUTPUT: I/O last 3 completed shifts: In: 1985.3 [I.V.:330; Other:20; NG/GT:1185.3; IV Piggyback:450] Out: 4350 [Other:3000; Stool:1350]  General:  Resting comfortably in bed HENT: NCAT ETT in place PULM: CTA B, vent supported breathing CV: RRR, no mgr GI: BS+, soft, nontender MSK: normal bulk and tone Neuro: awake, doesn't follow commands    LABS:  BMET  Recent Labs Lab 02/26/17 0515 02/27/17 0349 02/28/17 0309  NA 140 138 133*  K 3.9 3.2* 3.2*  CL 100* 97*  95*  CO2 26 27 24   BUN 71* 39* 60*  CREATININE 4.88* 3.59* 4.88*  GLUCOSE 149* 108* 158*    Electrolytes  Recent Labs Lab 02/22/17 0430  02/24/17 0545 02/24/17 0904  02/26/17 0515 02/27/17 0349 02/28/17 0309  CALCIUM 9.2  < > 9.2 9.4  < > 8.9 8.2* 8.1*  PHOS 2.7  --  2.6 2.8  --   --   --   --   < > = values in this interval not displayed.  CBC  Recent Labs Lab 02/26/17 0515 02/27/17 0349 02/28/17 0309  WBC 22.2* 21.1* 24.1*  HGB 8.3* 8.3* 9.1*  HCT 27.6* 27.6* 29.9*  PLT 566* 593* 614*    Coag's No results for input(s): APTT, INR in the last 168 hours.  Sepsis Markers No results for input(s): LATICACIDVEN, PROCALCITON, O2SATVEN in the last 168 hours.  ABG No results for input(s): PHART, PCO2ART, PO2ART in the last 168 hours.  Liver Enzymes  Recent Labs Lab 02/22/17 0430 02/23/17 0325 02/24/17 0545 02/24/17 0904 02/26/17 0515  AST 39 35  --   --  25  ALT 19 20  --   --  12*  ALKPHOS 541* 574*  --   --  558*  BILITOT 8.3* 6.5*  --   --  3.5*  ALBUMIN 1.6*  1.7* 1.9* 1.8* 1.8* 1.7*    Cardiac Enzymes No results for input(s): TROPONINI, PROBNP in the last 168 hours.  Glucose  Recent Labs Lab 02/27/17 1537 02/27/17 2048 02/27/17 2350 02/28/17 0357 02/28/17 0842 02/28/17 1209  GLUCAP 140* 140* 162* 149* 129* 115*   STUDIES:  Lower extremity ultrasound 5/18 >> no evidence of DVT CT head 5/19 >> normal exam Echo 5/20 >> EF 30-35%, increased LVF. Diffuse hypokinesis, akinesis of basilar mid-inferior myocardium, grade 2 diastolic dysfxn  EEG 5/20 >> finding c/w mod to severe global cerebral dysfxn. C/w anoxic injury given clinical course  LE [place] 5/21 >> negative MRI brain 5/24 >> ?thrombosed cortical vein, otherwise normal  CULTURES: Bcx 5/19 X 2 >> negative Blood cultures 6/1 >  c diff 6/1 > negative  ANTIBIOTICS: Clinidamycin 5/19 >> 5/21 Vancomycin 5/19 >> 5/25 >> restart 5/30 Zosyn 5/19 >> 5/22 Meropenem 5/22 >> 5/30 Flagyl  5/30>> Ceftriaxone 5/30 >>  SIGNIFICANT EVENTS: 5/19 Admit, cardiac arrest, transfer to ICU; hypothermia protocol started 5/24 off pressors.  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 Hx of HLD New murmur 6/1 - request TEE given rising WBC - coreg - hold isosorbide  Renal: ESRD Hypokalemia - repleated today - HD per renal  GI: Moderate protein calorie malnutrition Diarrhea c diff negative - tube feeds  Hematology Anemia of critical illness and chronic disease. - f/u CBC - aranesp  Infectious: Lt foot osteomyelitis. Per ID recommend total 10 days of antibiotics.  Continue ceftriaxone, flagyl plus vancomycin. New Murmur Diarrhea - check TEE - ortho willing to perform BKA if patient gives consent   Endocrine: DM type II. 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  Hx of DM neuropathy. - monitor mental status - hold outpt cymbalta, lyrica - hold sedatives as able  DVT prophylaxis - SQ heparin SUP - protonix Nutrition - tube feeds Goals of care - full code  My cc time 32 minutes  Mother updated bedside  Heber Nunda, MD [place] [facility] Pager: [phone] Cell: [phone] After 3pm or if no response, call [phone]

## 2017-02-28 NOTE — Progress Notes (Signed)
Subjective: Interval History: TEE pending, Still fevers,  Vanc/Merum  Objective: Vital signs in last 24 hours: Temp:  [97.1 F (36.2 C)-102.6 F (39.2 C)] 97.1 F (36.2 C) (06/02 0402) Pulse Rate:  [101-122] 101 (06/02 0600) Resp:  [0-29] 10 (06/02 0600) BP: (116-134)/(66-102) 122/70 (06/02 0600) SpO2:  [91 %-100 %] 100 % (06/02 0600) FiO2 (%):  [30 %-40 %] 30 % (06/02 0600) Weight:  [105.6 kg (232 lb 12.9 oz)] 105.6 kg (232 lb 12.9 oz) (06/02 0600) Weight change: -3.6 kg (-7 lb 15 oz)  Intake/Output from previous day: 06/01 0701 - 06/02 0700 In: 1745.3 [I.V.:210; NG/GT:1165.3; IV Piggyback:350] Out: 1300 [Stool:1300] Intake/Output this shift: Total I/O In: 590 [I.V.:110; NG/GT:380; IV Piggyback:100] Out: 750 [Stool:750]  General appearance: moderately obese, pale and eyes follow, no purposeful response,  Neck: PCL Resp: diminished breath sounds bilaterally and rhonchi bibasilar Cardio: S1, S2 normal, S4 present, diastolic murmur: holodiastolic 3/6, blowing at apex and cont M radiates to ULSB GI: obese, striae, soft.liver down 5 cm Extremities: dressings both feet, , AVF LUA  Lab Results:  Recent Labs  02/27/17 0349 02/28/17 0309  WBC 21.1* 24.1*  HGB 8.3* 9.1*  HCT 27.6* 29.9*  PLT 593* 614*   BMET:  Recent Labs  02/27/17 0349 02/28/17 0309  NA 138 133*  K 3.2* 3.2*  CL 97* 95*  CO2 27 24  GLUCOSE 108* 158*  BUN 39* 60*  CREATININE 3.59* 4.88*  CALCIUM 8.2* 8.1*   No results for input(s): PTH in the last 72 hours. Iron Studies: No results for input(s): IRON, TIBC, TRANSFERRIN, FERRITIN in the last 72 hours.  Studies/Results: Dg Chest Port 1 View  Result Date: 02/27/2017 CLINICAL DATA:  Hypoxia. EXAM: PORTABLE CHEST 1 VIEW COMPARISON:  02/26/2017. FINDINGS: Tracheostomy to in stable position. Stable cardiomegaly. Low lung volumes. Progressive left lower lobe atelectasis and infiltrate. Small left pleural effusion cannot be excluded . IMPRESSION: 1.  Tracheostomy tube in stable position. 2. Progressive left lower lobe atelectasis and infiltrate. Small left pleural effusion cannot be excluded . Electronically Signed   By: Maisie Fus  Register   On: 02/27/2017 07:01   Dg Chest Port 1 View  Result Date: 02/26/2017 CLINICAL DATA:  Status post tracheostomy placement. EXAM: PORTABLE CHEST 1 VIEW COMPARISON:  Earlier today. FINDINGS: The endotracheal tube has been removed and replaced with a tracheostomy tube in satisfactory position. The nasogastric tube and esophageal probe have been removed. The left jugular catheter has been removed. No pneumothorax. Grossly stable enlarged cardiac silhouette. Left lower lobe opacity with improvement laterally since 02/25/2017. Clear right lung. Unremarkable bones. IMPRESSION: 1. Tracheostomy tube in satisfactory position. 2. Left lower lobe atelectasis or pneumonia with mild improvement since 2 days ago. 3. Stable cardiomegaly. Electronically Signed   By: Beckie Salts M.D.   On: 02/26/2017 16:02   Dg Abd Portable 1v  Result Date: 02/27/2017 CLINICAL DATA:  Feeding tube placement EXAM: PORTABLE ABDOMEN - 1 VIEW COMPARISON:  None. FINDINGS: Feeding tube with the tip projecting over the antrum of the stomach. There is no bowel dilatation to suggest obstruction. There is no evidence of pneumoperitoneum, portal venous gas or pneumatosis. There are no pathologic calcifications along the expected course of the ureters. The osseous structures are unremarkable. IMPRESSION: Feeding tube with the tip projecting over the antrum of the stomach. Electronically Signed   By: Elige Ko   On: 02/27/2017 11:05    I have reviewed the patient's current medications.  Assessment/Plan: 1 ESRD for HD, some  vol xs 2 Anemia esa, stable 3 HPTH meds 4 DM controlled 5 Nutrition TF 6 Fevers foot, prob endocarditis, ? Other 7 Obesity 8 Anoxic enceph post arrest 9 PVD per Ortho needs Amp 10 New M, could be very loud rub also P AB, TF, HD,  f/u echo,   Outlook grim    LOS: 14 days   [name] L 02/28/2017,6:57 AM

## 2017-03-01 ENCOUNTER — Inpatient Hospital Stay (HOSPITAL_COMMUNITY): Payer: Medicaid Other

## 2017-03-01 LAB — RENAL FUNCTION PANEL
Albumin: 2 g/dL — ABNORMAL LOW (ref 3.5–5.0)
Anion gap: 13 (ref 5–15)
BUN: 45 mg/dL — AB (ref 6–20)
CO2: 25 mmol/L (ref 22–32)
CREATININE: 3.61 mg/dL — AB (ref 0.44–1.00)
Calcium: 8.5 mg/dL — ABNORMAL LOW (ref 8.9–10.3)
Chloride: 98 mmol/L — ABNORMAL LOW (ref 101–111)
GFR calc Af Amer: 17 mL/min — ABNORMAL LOW (ref >60)
GFR calc non Af Amer: 15 mL/min — ABNORMAL LOW (ref >60)
GLUCOSE: 179 mg/dL — AB (ref 65–99)
Phosphorus: 2.8 mg/dL (ref 2.5–4.6)
Potassium: 3.7 mmol/L (ref 3.5–5.1)
Sodium: 136 mmol/L (ref 135–145)

## 2017-03-01 LAB — GLUCOSE, CAPILLARY
GLUCOSE-CAPILLARY: 141 mg/dL — AB (ref 65–99)
GLUCOSE-CAPILLARY: 162 mg/dL — AB (ref 65–99)
Glucose-Capillary: 162 mg/dL — ABNORMAL HIGH (ref 65–99)
Glucose-Capillary: 161 mg/dL — ABNORMAL HIGH (ref 65–99)
Glucose-Capillary: 148 mg/dL — ABNORMAL HIGH (ref 65–99)
Glucose-Capillary: 127 mg/dL — ABNORMAL HIGH (ref 65–99)

## 2017-03-01 LAB — BASIC METABOLIC PANEL
ANION GAP: 15 (ref 5–15)
BUN: 36 mg/dL — ABNORMAL HIGH (ref 6–20)
CALCIUM: 8.2 mg/dL — AB (ref 8.9–10.3)
CO2: 26 mmol/L (ref 22–32)
Chloride: 95 mmol/L — ABNORMAL LOW (ref 101–111)
Creatinine, Ser: 3.3 mg/dL — ABNORMAL HIGH (ref 0.44–1.00)
GFR, EST AFRICAN AMERICAN: 19 mL/min — AB (ref >60)
GFR, EST NON AFRICAN AMERICAN: 17 mL/min — AB (ref >60)
Glucose, Bld: 177 mg/dL — ABNORMAL HIGH (ref 65–99)
Potassium: 3.8 mmol/L (ref 3.5–5.1)
Sodium: 136 mmol/L (ref 135–145)

## 2017-03-01 NOTE — Progress Notes (Signed)
Patient did approximately 15 minutes of PSV at 15/5 this morning. Patient returned back to full support as RR approached 40. Will reassess later in the day.

## 2017-03-01 NOTE — Progress Notes (Signed)
[facility] for Infectious Disease    Date of Admission:  02/14/2017   Total days of antibiotics 14        Day 5 ctx, metro, vanco           ID: [name] is a 36 y.o. female with  IDDM, ESRD on HD, hx of diabetic foot ulcer s/p bilateral TM amputation and most recently L TM amputation in Jan 2018, and revision of amputation site on 4/25. On 5/19 day of admit had ROSC x 9 min, unclear how long downtime to suggests degree of anoxic brain injury. She underwent cooling protocol.Trach placed 5/31.   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 Afebrile x 24hr. RT attempting trach trial. RN reports increasing drainage froom L foot wound  This morning cxr by my read has pulmonary congestion Medications:  . calcium carbonate (dosed in mg elemental calcium)  2,000 mg of elemental calcium Oral TID  . chlorhexidine gluconate (MEDLINE KIT)  15 mL Mouth Rinse BID  . [START ON 03/03/2017] darbepoetin (ARANESP) injection - DIALYSIS  200 mcg Intravenous Q Tue-HD  . feeding supplement (NEPRO CARB STEADY)  1,000 mL Per Tube Q24H  . feeding supplement (PRO-STAT SUGAR FREE 64)  60 mL Per Tube QID  . [name]'s butt cream   Topical BID  . heparin  5,000 Units Subcutaneous Q8H  . insulin aspart  0-15 Units Subcutaneous Q4H  . mouth rinse  15 mL Mouth Rinse QID  . metoprolol succinate  12.5 mg Oral QHS  .  multivitamin  1 tablet Oral QHS  . pantoprazole sodium  40 mg Per Tube Q24H    Objective: Vital signs in last 24 hours: Temp:  [98.1 F (36.7 C)-100.3 F (37.9 C)] 99 F (37.2 C) (06/03 1224) Pulse Rate:  [109-122] 119 (06/03 1000) Resp:  [0-30] 17 (06/03 1000) BP: (92-145)/(67-91) 119/87 (06/03 1000) SpO2:  [99 %-100 %] 100 % (06/03 1224) FiO2 (%):  [30 %-50 %] 50 % (06/03 1224) Weight:  [222 lb 10.6 oz (101 kg)] 222 lb 10.6 oz (101 kg) (06/03 0438) Physical Exam  Constitutional:  appears  chronically and well-nourished. No distress.  HENT: [place]/AT, PERRLA, mild scleral icterus Mouth/Throat: Oropharynx is clear and moist. No oropharyngeal exudate.  Neck: trach in place Cardiovascular: tachy, murmur difficult to auscultate due to course rhonchi Pulmonary/Chest: course bilateral rhonchi, acc. M. Use. No wheezing. tachypnic Neck = supple, no nuchal rigidity Abdominal: Soft. Bowel sounds are normal.  exhibits no distension. There is no tenderness.  Neurological: eyes open, tracks, moves extremities arms> legs.  Skin: Left foot wrapped, serous drainage on dressing   Lab Results  Recent Labs  02/27/17 0349 02/28/17 0309 03/01/17 0243 03/01/17 0956  WBC 21.1* 24.1*  --   --   HGB 8.3* 9.1*  --   --   HCT 27.6* 29.9*  --   --  NA 138 133* 136 136  K 3.2* 3.2* 3.8 3.7  CL 97* 95* 95* 98*  CO2 27 24 26 25   BUN 39* 60* 36* 45*  CREATININE 3.59* 4.88* 3.30* 3.61*   Liver Panel  Recent Labs  03/01/17 0956  ALBUMIN 2.0*    Microbiology: 5/19 blood cx ngtd 6/1 blood cx ngtd Studies/Results: Dg Chest Port 1 View  Result Date: 03/01/2017 CLINICAL DATA:  Respiratory failure EXAM: PORTABLE CHEST 1 VIEW COMPARISON:  02/27/2017 FINDINGS: 0546 hours. Patient rotated to the left. Tracheostomy tube remains in place. A feeding tube passes into the stomach although the distal tip position is not included on the film. The cardio pericardial silhouette is enlarged. Left base  collapse/consolidation again noted. There is pulmonary vascular congestion without overt pulmonary edema. IMPRESSION: Rotated film with cardiomegaly and vascular congestion. Persistent left base collapse/ consolidation. Electronically Signed   By: Misty Stanley M.D.   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  Dr. Sharol Given to reassess today per report from Ortho team on Friday. - would like to know if it would make sense to do bedside debridement at least remove sutures so that can do wound care with debridement agents  Fevers =  - afebrile x 24hr. No new cx results though concern about left foot wound vs. New murmur, recommend TEE - Central fevers possibly with brain injury, but WBC continues to be elevated.    Ventilatory Failure= - Trach collar trial  Heart Murmur, New =  - TEE ordered with persistent leukocytosis and fevers   Dr Linus Salmons to see tomorrow  Baxter Flattery, [facility] for Infectious Diseases Cell: [phone] Pager: [phone]  03/01/2017, 12:37 PM

## 2017-03-01 NOTE — Progress Notes (Signed)
Subjective: Interval History: no signif fevers past 24 h..  Objective: Vital signs in last 24 hours: Temp:  [97.1 F (36.2 C)-99.8 F (37.7 C)] 98.9 F (37.2 C) (06/03 0437) Pulse Rate:  [95-122] 121 (06/03 0600) Resp:  [0-30] 12 (06/03 0600) BP: (92-130)/(67-91) 130/85 (06/03 0600) SpO2:  [99 %-100 %] 99 % (06/03 0600) FiO2 (%):  [30 %] 30 % (06/03 0340) Weight:  [101 kg (222 lb 10.6 oz)-105.1 kg (231 lb 11.3 oz)] 101 kg (222 lb 10.6 oz) (06/03 0438) Weight change: -0.5 kg (-1 lb 1.6 oz)  Intake/Output from previous day: 06/02 0701 - 06/03 0700 In: 1200 [I.V.:240; NG/GT:610; IV Piggyback:350] Out: 2850 [Stool:350] Intake/Output this shift: No intake/output data recorded.  General appearance: tracks, no meaningful response, NG tube Resp: diminished breath sounds bilaterally and rales bibasilar Cardio: S1, S2 normal, systolic murmur: holosystolic 2/6, blowing at apex and diastolic murmur: holodiastolic 2/6, blowing at apex GI: obese, striae,pos bs, soft Extremities: edema 1+, AVF LUA  Lab Results:  Recent Labs  02/27/17 0349 02/28/17 0309  WBC 21.1* 24.1*  HGB 8.3* 9.1*  HCT 27.6* 29.9*  PLT 593* 614*   BMET:  Recent Labs  02/28/17 0309 03/01/17 0243  NA 133* 136  K 3.2* 3.8  CL 95* 95*  CO2 24 26  GLUCOSE 158* 177*  BUN 60* 36*  CREATININE 4.88* 3.30*  CALCIUM 8.1* 8.2*   No results for input(s): PTH in the last 72 hours. Iron Studies: No results for input(s): IRON, TIBC, TRANSFERRIN, FERRITIN in the last 72 hours.  Studies/Results: Dg Abd Portable 1v  Result Date: 02/27/2017 CLINICAL DATA:  Feeding tube placement EXAM: PORTABLE ABDOMEN - 1 VIEW COMPARISON:  None. FINDINGS: Feeding tube with the tip projecting over the antrum of the stomach. There is no bowel dilatation to suggest obstruction. There is no evidence of pneumoperitoneum, portal venous gas or pneumatosis. There are no pathologic calcifications along the expected course of the ureters. The  osseous structures are unremarkable. IMPRESSION: Feeding tube with the tip projecting over the antrum of the stomach. Electronically Signed   By: Elige Ko   On: 02/27/2017 11:05    I have reviewed the patient's current medications.  Assessment/Plan: 1 ESRD vol xs on CXR , will do extra HD tomorrow 2 Anemia stable 3 INfx vanc/mero  Less fever, still ^ WBC. Foot vs endocarditis 4 Cont Murmur less loud, ? Rub 5 Enceph no change 6 HPTH need P and alb 7 obesity 8 PVD needs foot off 9 Nutrition tf 10 VDRF per CCM P HD extra in am, F/U TEE, Ab, TF. vent     LOS: 15 days   Baelyn Doring L 03/01/2017,7:11 AM

## 2017-03-01 NOTE — Progress Notes (Addendum)
PULMONARY / CRITICAL CARE MEDICINE   Name: [name] MRN: [id number] DOB: [birth year]    ADMISSION DATE:  02/14/2017 CONSULTATION DATE:  02/14/2017  REFERRING MD:  Feliz Beam MD  CHIEF COMPLAINT:  Sepsis, cardiac arrest  HISTORY OF PRESENT ILLNESS:   36 year old female w/ ESRD d/t poorly controlled DM, bilateral transmetatarsal foot amputations, obesity, HTN, medical non-compliance as well as NICM. Admitted 5/19 with complaints of left leg pain, found to have radiological evidence of osteomyelitis with wound nonhealing, dehiscence, purulent drainage from the wound. Admitted for IV antibiotics, hemodialysis and possible surgery.  Found unresponsive in the room on 5/19. She had chest compressions, epi bicarb given. She was intubated by anesthesia and had ROSC in approximately 9 minutes. Downtime prior to code is unknown. She got dilaudid in the afternoon for leg pain.  SUBJECTIVE:  No acute events Not following commands Awake Remains on mechanical ventilator  VITAL SIGNS: BP 119/87   Pulse (!) 119   Temp 99 F (37.2 C) (Oral)   Resp 17   Ht 5' 8.5" (1.74 m)   Wt 101 kg (222 lb 10.6 oz)   LMP  (LMP Unknown) Comment: Patient trached  SpO2 100%   BMI 33.36 kg/m   VENTILATOR SETTINGS: Vent Mode: PRVC FiO2 (%):  [30 %-50 %] 50 % Set Rate:  [16 bmp] 16 bmp Vt Set:  [520 mL] 520 mL PEEP:  [5 cmH20] 5 cmH20 Pressure Support:  [14 cmH20] 14 cmH20 Plateau Pressure:  [22 cmH20-25 cmH20] 22 cmH20  INTAKE / OUTPUT: I/O last 3 completed shifts: In: 1840 [I.V.:360; NG/GT:1030; IV Piggyback:450] Out: 3600 [Other:2500; Stool:1100]  General:  Resting comfortably in bed HENT: NCAT trach in place PULM: CTA B, normal effort CV: RRR, systolic murmur noted GI: BS+, soft, nontender MSK: normal bulk and tone Neuro: awake, doesn't follow commands   LABS:  BMET  Recent Labs Lab 02/28/17 0309 03/01/17 0243 03/01/17 0956  NA 133* 136 136  K 3.2* 3.8 3.7  CL 95* 95* 98*  CO2 24 26  25   BUN 60* 36* 45*  CREATININE 4.88* 3.30* 3.61*  GLUCOSE 158* 177* 179*    Electrolytes  Recent Labs Lab 02/24/17 0545 02/24/17 0904  02/28/17 0309 03/01/17 0243 03/01/17 0956  CALCIUM 9.2 9.4  < > 8.1* 8.2* 8.5*  PHOS 2.6 2.8  --   --   --  2.8  < > = values in this interval not displayed.  CBC  Recent Labs Lab 02/26/17 0515 02/27/17 0349 02/28/17 0309  WBC 22.2* 21.1* 24.1*  HGB 8.3* 8.3* 9.1*  HCT 27.6* 27.6* 29.9*  PLT 566* 593* 614*    Coag's No results for input(s): APTT, INR in the last 168 hours.  Sepsis Markers No results for input(s): LATICACIDVEN, PROCALCITON, O2SATVEN in the last 168 hours.  ABG No results for input(s): PHART, PCO2ART, PO2ART in the last 168 hours.  Liver Enzymes  Recent Labs Lab 02/23/17 0325  02/24/17 0904 02/26/17 0515 03/01/17 0956  AST 35  --   --  25  --   ALT 20  --   --  12*  --   ALKPHOS 574*  --   --  558*  --   BILITOT 6.5*  --   --  3.5*  --   ALBUMIN 1.9*  < > 1.8* 1.7* 2.0*  < > = values in this interval not displayed.  Cardiac Enzymes No results for input(s): TROPONINI, PROBNP in the last 168 hours.  Glucose  Recent Labs Lab 02/28/17 1625 02/28/17 2044 02/28/17 2353 03/01/17 0435 03/01/17 0840 03/01/17 1222  GLUCAP 141* 133* 158* 162* 161* 148*   STUDIES:  Lower extremity ultrasound 5/18 >> no evidence of DVT CT head 5/19 >> normal exam Echo 5/20 >> EF 30-35%, increased LVF. Diffuse hypokinesis, akinesis of basilar mid-inferior myocardium, grade 2 diastolic dysfxn  EEG 5/20 >> finding c/w mod to severe global cerebral dysfxn. C/w anoxic injury given clinical course  LE [place] 5/21 >> negative MRI brain 5/24 >> ?thrombosed cortical vein, otherwise normal  CULTURES: Bcx 5/19 X 2 >> negative Blood cultures 6/1 >  c diff 6/1 > negative  ANTIBIOTICS: Clinidamycin 5/19 >> 5/21 Vancomycin 5/19 >> 5/25 >> restart 5/30 Zosyn 5/19 >> 5/22 Meropenem 5/22 >> 5/30 Flagyl 5/30>> Ceftriaxone 5/30  >>  SIGNIFICANT EVENTS: 5/19 Admit, cardiac arrest, transfer to ICU; hypothermia protocol started 5/24 off pressors.  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 Hx of HLD New murmur 6/1 - awaiting TEE - coreg - hold isosorbide  Renal: ESRD - HD per renal  GI: Moderate protein calorie malnutrition Diarrhea c diff negative - tube feeds - IR consult for PEG tube  Hematology Anemia of critical illness and chronic disease. - f/u CBC - aranesp  Infectious: Lt foot osteomyelitis. Per ID recommend total 10 days of antibiotics.  Continue ceftriaxone, flagyl plus vancomycin. 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  Hx of DM neuropathy. - monitor mental status - hold outpt cymbalta, lyrica - hold sedatives as able  DVT prophylaxis - SQ heparin SUP - protonix Nutrition - tube feeds Goals of care - full code  My cc time 31 minutes  Mother updated bedside 6/3  [place], MD [place] Pager: [phone] Cell: [phone] After 3pm or if no response, call [phone]

## 2017-03-02 ENCOUNTER — Inpatient Hospital Stay (HOSPITAL_COMMUNITY): Payer: Medicaid Other

## 2017-03-02 DIAGNOSIS — I96 Gangrene, not elsewhere classified: Secondary | ICD-10-CM

## 2017-03-02 DIAGNOSIS — Z9911 Dependence on respirator [ventilator] status: Secondary | ICD-10-CM

## 2017-03-02 DIAGNOSIS — D72829 Elevated white blood cell count, unspecified: Secondary | ICD-10-CM

## 2017-03-02 DIAGNOSIS — R7881 Bacteremia: Secondary | ICD-10-CM | POA: Insufficient documentation

## 2017-03-02 DIAGNOSIS — I340 Nonrheumatic mitral (valve) insufficiency: Secondary | ICD-10-CM

## 2017-03-02 DIAGNOSIS — J9621 Acute and chronic respiratory failure with hypoxia: Secondary | ICD-10-CM

## 2017-03-02 LAB — CBC
HEMATOCRIT: 28.2 % — AB (ref 36.0–46.0)
HEMOGLOBIN: 8.5 g/dL — AB (ref 12.0–15.0)
MCH: 29 pg (ref 26.0–34.0)
MCHC: 30.1 g/dL (ref 30.0–36.0)
MCV: 96.2 fL (ref 78.0–100.0)
Platelets: 732 10*3/uL — ABNORMAL HIGH (ref 150–400)
RBC: 2.93 MIL/uL — AB (ref 3.87–5.11)
RDW: 17 % — AB (ref 11.5–15.5)
WBC: 22.4 10*3/uL — AB (ref 4.0–10.5)

## 2017-03-02 LAB — RENAL FUNCTION PANEL
ALBUMIN: 1.9 g/dL — AB (ref 3.5–5.0)
Anion gap: 13 (ref 5–15)
BUN: 67 mg/dL — ABNORMAL HIGH (ref 6–20)
CHLORIDE: 98 mmol/L — AB (ref 101–111)
CO2: 26 mmol/L (ref 22–32)
Calcium: 8.6 mg/dL — ABNORMAL LOW (ref 8.9–10.3)
Creatinine, Ser: 4.57 mg/dL — ABNORMAL HIGH (ref 0.44–1.00)
GFR, EST AFRICAN AMERICAN: 13 mL/min — AB (ref >60)
GFR, EST NON AFRICAN AMERICAN: 11 mL/min — AB (ref >60)
Glucose, Bld: 155 mg/dL — ABNORMAL HIGH (ref 65–99)
PHOSPHORUS: 3.5 mg/dL (ref 2.5–4.6)
POTASSIUM: 3.6 mmol/L (ref 3.5–5.1)
Sodium: 137 mmol/L (ref 135–145)

## 2017-03-02 LAB — GLUCOSE, CAPILLARY
GLUCOSE-CAPILLARY: 117 mg/dL — AB (ref 65–99)
GLUCOSE-CAPILLARY: 148 mg/dL — AB (ref 65–99)
GLUCOSE-CAPILLARY: 178 mg/dL — AB (ref 65–99)
Glucose-Capillary: 139 mg/dL — ABNORMAL HIGH (ref 65–99)
Glucose-Capillary: 151 mg/dL — ABNORMAL HIGH (ref 65–99)
Glucose-Capillary: 152 mg/dL — ABNORMAL HIGH (ref 65–99)

## 2017-03-02 MED ORDER — PENTAFLUOROPROP-TETRAFLUOROETH EX AERO: 1.0000 "application " | INHALATION_SPRAY | CUTANEOUS | Status: DC | PRN

## 2017-03-02 MED ORDER — LIDOCAINE-PRILOCAINE 2.5-2.5 % EX CREA: 1.0000 "application " | TOPICAL_CREAM | CUTANEOUS | Status: DC | PRN

## 2017-03-02 MED ORDER — LIDOCAINE HCL (PF) 1 % IJ SOLN: 5.0000 mL | INTRAMUSCULAR | Status: DC | PRN

## 2017-03-02 MED ORDER — VANCOMYCIN HCL IN DEXTROSE 1-5 GM/200ML-% IV SOLN
1000.0000 mg | INTRAVENOUS | Status: AC
  Administered 2017-03-02: 1000 mg via INTRAVENOUS
  Filled 2017-03-02: qty 200

## 2017-03-02 MED ORDER — METRONIDAZOLE IN NACL 5-0.79 MG/ML-% IV SOLN
500.0000 mg | Freq: Three times a day (TID) | INTRAVENOUS | Status: AC
  Administered 2017-03-02 – 2017-03-06 (×14): 500 mg via INTRAVENOUS
  Filled 2017-03-02 (×15): qty 100

## 2017-03-02 MED ORDER — DEXTROSE 5 % IV SOLN
2.0000 g | INTRAVENOUS | Status: AC
  Administered 2017-03-02 – 2017-03-06 (×5): 2 g via INTRAVENOUS
  Filled 2017-03-02 (×6): qty 2

## 2017-03-02 MED ORDER — HEPARIN SODIUM (PORCINE) 1000 UNIT/ML DIALYSIS
1000.0000 [IU] | INTRAMUSCULAR | Status: DC | PRN
  Filled 2017-03-02: qty 1

## 2017-03-02 MED ORDER — SODIUM CHLORIDE 0.9 % IV SOLN: 100.0000 mL | INTRAVENOUS | Status: DC | PRN

## 2017-03-02 MED ORDER — MIDAZOLAM HCL 2 MG/2ML IJ SOLN
INTRAMUSCULAR | Status: AC
  Filled 2017-03-02: qty 4

## 2017-03-02 MED ORDER — MIDAZOLAM HCL 2 MG/2ML IJ SOLN
6.0000 mg | Freq: Once | INTRAMUSCULAR | Status: AC
  Administered 2017-03-02: 6 mg via INTRAVENOUS

## 2017-03-02 MED ORDER — ALTEPLASE 2 MG IJ SOLR: 2.0000 mg | Freq: Once | INTRAMUSCULAR | Status: DC | PRN

## 2017-03-02 MED ORDER — HEPARIN SODIUM (PORCINE) 1000 UNIT/ML DIALYSIS
100.0000 [IU]/kg | INTRAMUSCULAR | Status: DC | PRN
  Administered 2017-03-02: 10100 [IU] via INTRAVENOUS_CENTRAL
  Filled 2017-03-02 (×2): qty 11

## 2017-03-02 MED ORDER — MIDAZOLAM HCL 2 MG/2ML IJ SOLN
INTRAMUSCULAR | Status: AC
  Administered 2017-03-02: 6 mg via INTRAVENOUS
  Filled 2017-03-02: qty 4

## 2017-03-02 NOTE — Progress Notes (Signed)
Patient ID: [name], female   DOB: 1981-02-18, 35 y.o.   MRN: [id number] Patient is seen for evaluation of both lower extremities. She is currently intubated through a trach. While I was examining the patient the ventilator became disconnected from the trach collar due to the patient shaking her head. This was reattached.  Examination of the left foot she has dry gangrene involving the heel and Achilles. She has approximately 50% of her surgical incision is dry gangrenous with exposed bone. There is no cellulitis no abscess no signs of infection. Examination of the right foot patient has a stable transmetatarsal amputation with no ulcers no abscess no cellulitis no gangrene. Patient has a spastic flexion contracture of the left lower extremity.  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  Plan: I discussed with the patient and her mother the recommendation to proceed with a transtibial amputation. This was also discussed multiple times in the office, prior to this admission. The patient seems to shake her head no, that she does not want to proceed with a transtibial amputation. The mother again reinforces the patient's wishes that she did not want to proceed with a transtibial amputation. Discussed risk of infection discussed continued pain from the gangrenous changes. Patient's mother states she understands.

## 2017-03-02 NOTE — Progress Notes (Signed)
TEE: During this procedure the patient is administered a total of Versed 4 mg and Fentanyl 100 mg to achieve and maintain moderate conscious sedation.  The patient's heart rate, blood pressure, and oxygen saturation are monitored continuously during the procedure. The period of conscious sedation is 30 minutes, of which I was present face-to-face 100% of this time.  Done at bedside in MICU  Mild MR Normal AV Mild TR Mild PR EF 30-35% diffuse hypokinesis Normal aorta No LAA thrombus No PFO Normal RV Moderate pericardial effusion with synechia suggesting some chronicity  No SBE or vegetations Patient appears to have a rub on exam  Charlton Haws

## 2017-03-02 NOTE — Procedures (Signed)
I was present at this dialysis session. I have reviewed the session itself and made appropriate changes.   Tolerating HD, on 4K bath.  Pt awake and alert, able to show me 2 fingers with both hands on command.     CT A/P with large pericardial effusion and recent new identified murmur. Echo pending today.  Will cont on THS schedule, today is extra Tx.  Hold Bolus hep tomorrow with effusion, await echo eval.   Filed Weights   03/01/17 0438 03/02/17 0500 03/02/17 0745  Weight: 101 kg (222 lb 10.6 oz) 103.3 kg (227 lb 11.8 oz) 104.1 kg (229 lb 8 oz)     Recent Labs Lab 03/02/17 0239  NA 137  K 3.6  CL 98*  CO2 26  GLUCOSE 155*  BUN 67*  CREATININE 4.57*  CALCIUM 8.6*  PHOS 3.5     Recent Labs Lab 02/27/17 0349 02/28/17 0309 03/02/17 0239  WBC 21.1* 24.1* 22.4*  HGB 8.3* 9.1* 8.5*  HCT 27.6* 29.9* 28.2*  MCV 96.2 96.5 96.2  PLT 593* 614* 732*    Scheduled Meds: . calcium carbonate (dosed in mg elemental calcium)  2,000 mg of elemental calcium Oral TID  . chlorhexidine gluconate (MEDLINE KIT)  15 mL Mouth Rinse BID  . [START ON 03/03/2017] darbepoetin (ARANESP) injection - DIALYSIS  200 mcg Intravenous Q Tue-HD  . feeding supplement (NEPRO CARB STEADY)  1,000 mL Per Tube Q24H  . feeding supplement (PRO-STAT SUGAR FREE 64)  60 mL Per Tube QID  . [name]'s butt cream   Topical BID  . heparin  5,000 Units Subcutaneous Q8H  . insulin aspart  0-15 Units Subcutaneous Q4H  . mouth rinse  15 mL Mouth Rinse QID  . metoprolol succinate  12.5 mg Oral QHS  . multivitamin  1 tablet Oral QHS  . pantoprazole sodium  40 mg Per Tube Q24H   Continuous Infusions: . sodium chloride 10 mL/hr at 03/02/17 0800  . sodium chloride    . sodium chloride    . sodium chloride    . sodium chloride    . cefTRIAXone (ROCEPHIN)  IV    . metronidazole Stopped (03/02/17 0103)  . vancomycin Stopped (02/28/17 1139)  . vancomycin     PRN Meds:.sodium chloride, sodium chloride, sodium chloride,  sodium chloride, acetaminophen (TYLENOL) oral liquid 160 mg/5 mL, albuterol, alteplase, fentaNYL (SUBLIMAZE) injection, heparin, heparin, heparin, lidocaine (PF), lidocaine-prilocaine, pentafluoroprop-tetrafluoroeth   Pearson Grippe  MD 03/02/2017, 9:06 AM

## 2017-03-02 NOTE — Progress Notes (Signed)
    [facility]   Reason for visit: Follow up on fever, leukocytosis  Interval History: has gangrene of foot, no local infection, now afebrile 3 days, leukocytosis persists.  Dr. [name] discussed care with mom and patient and recommended amputation, which has been refused.  TEE not yet done.    Physical Exam: Constitutional:  Vitals:   03/02/17 0845 03/02/17 0900  BP: (!) 131/91 (!) 138/93  Pulse: (!) 109 (!) 111  Resp: 18 18  Temp:     patient appears in NAD Eyes: anicteric HENT: + trach, on vent Respiratory: diffuse rhonchi Cardiovascular: tachy RR GI: soft, nt, nd  Review of Systems: Unable to be assessed due to patient factors  Lab Results  Component Value Date   WBC 22.4 (H) 03/02/2017   HGB 8.5 (L) 03/02/2017   HCT 28.2 (L) 03/02/2017   MCV 96.2 03/02/2017   PLT 732 (H) 03/02/2017    Lab Results  Component Value Date   CREATININE 4.57 (H) 03/02/2017   BUN 67 (H) 03/02/2017   NA 137 03/02/2017   K 3.6 03/02/2017   CL 98 (L) 03/02/2017   CO2 26 03/02/2017    Lab Results  Component Value Date   ALT 12 (L) 02/26/2017   AST 25 02/26/2017   ALKPHOS 558 (H) 02/26/2017     Microbiology: Recent Results (from the past 240 hour(s))  C difficile quick scan w PCR reflex     Status: None   Collection Time: 02/27/17  9:41 AM  Result Value Ref Range Status   C Diff antigen NEGATIVE NEGATIVE Final   C Diff toxin NEGATIVE NEGATIVE Final   C Diff interpretation No C. difficile detected.  Final  Culture, blood (Routine X 2) w Reflex to ID Panel     Status: None (Preliminary result)   Collection Time: 02/27/17 10:39 AM  Result Value Ref Range Status   Specimen Description BLOOD RIGHT HAND  Final   Special Requests IN PEDIATRIC BOTTLE Blood Culture adequate volume  Final   Culture NO GROWTH 2 DAYS  Final   Report Status PENDING  Incomplete  Culture, blood (Routine X 2) w Reflex to ID Panel     Status: None (Preliminary result)   Collection  Time: 02/27/17 10:49 AM  Result Value Ref Range Status   Specimen Description BLOOD RIGHT HAND  Final   Special Requests IN PEDIATRIC BOTTLE Blood Culture adequate volume  Final   Culture NO GROWTH 2 DAYS  Final   Report Status PENDING  Incomplete    Impression/Plan:  1. Fever - this has resolved for now.  Certainly will be at risk of further fever with dry gangrene and continued necrosis.  Can continue antibiotics to complete the 10 days, now day 6/10 and stop.   2. Leukocytosis - likely from ongoing gangrene.    3.  Dry gangrene - of left foot, noted on xray.  Site of gangrene.  Refused amputation.  No indication for long term antibiotics since treatment is only surgical amputation.  Most consistent with vascular dry gangrene rather than infectious.    4.  New murmur - can do TEE but with no treatment for gangrene, will not change outcome so ok from ID standpoint to hold for now.   5.  Disposition - will need palliative care due to above.    I am available for follow up if needed or situation changes, otherwise I will sign off. thanks

## 2017-03-02 NOTE — Progress Notes (Signed)
Pharmacy Antibiotic Note  [name] is a 36 y.o. female admitted on 02/14/2017 with diabetic foot ulcer s/p right transmetatarsal amputation with revision of site on 4/25.  Pharmacy has been consulted for vancomycin dosing. Patient is also on ceftriaxone and metronidazole. Patient has ESRD on HD (TTS), extra HD session scheduled for today. Patient with persistent fevers and elevated WBC. New murmur heard pending TEE likely today.  Plan:  - Vancomycin 1g IV today post HD, then resume Vancomycin 1g IV qTTS post HD - Ceftriaxone 2g IV q24h - Metronidazole 500 mg IV q8h - Monitor renal plans, C&S and duration of therapy - Check vancomycin random as needed  Height: 5' 8.5" (174 cm) Weight: 229 lb 8 oz (104.1 kg) IBW/kg (Calculated) : 65.05  Temp (24hrs), Avg:99.4 F (37.4 C), Min:98.8 F (37.1 C), Max:100.3 F (37.9 C)   Recent Labs Lab 02/25/17 0504 02/26/17 0515 02/27/17 0349 02/28/17 0309 03/01/17 0243 03/01/17 0956 03/02/17 0239  WBC 20.2* 22.2* 21.1* 24.1*  --   --  22.4*  CREATININE 3.53* 4.88* 3.59* 4.88* 3.30* 3.61* 4.57*    Estimated Creatinine Clearance: 21.5 mL/min (A) (by C-G formula based on SCr of 4.57 mg/dL (H)).    Allergies  Allergen Reactions  . Aspirin Anaphylaxis  . Sulfur Hives  . Tramadol Hives and Other (See Comments)    Pt states she feels weird   . Gabapentin Other (See Comments)    Lethargic     Antimicrobials this admission: Vanc 5/19>> 5/25;  5/30 > Zosyn 5/19>> 5/22 Clindamycin 5/19>> 5/21 Meropenem 5/22 >> 5/30 Ceftriax 5/30> Metronid 5/30>  Dose adjustments this admission: 5/25 pre-HD VR: 23 (on 1 g w/ HD)  Microbiology results: 5/19 BCx: neg 5/19:  MRSA PCR: neg 6/1 BCx: ngtd 6/1 CDiff: neg  Thank you for allowing pharmacy to be a part of this patient's care.  Sherron Monday, PharmD Clinical Pharmacy Resident Pager: [phone] 03/02/17 8:36 AM

## 2017-03-02 NOTE — Progress Notes (Signed)
MD notified of 13 beat run of Vtach. New orders for lab work placed, nursing will continue to monitor.

## 2017-03-02 NOTE — Progress Notes (Signed)
Patient placed on 40% trach collar.  Currently tolerating well.  Will continue to monitor.  

## 2017-03-02 NOTE — Progress Notes (Signed)
eLink Physician-Brief Progress Note Patient Name: [name] DOB: 09-10-1981 MRN: [id number]   Date of Service  03/02/2017  HPI/Events of Note  Short VT run  eICU Interventions  Self resolved  Get lytes     Intervention Category Intermediate Interventions: Arrhythmia - evaluation and management  [name]. 03/02/2017, 11:42 PM

## 2017-03-02 NOTE — Progress Notes (Signed)
Request orders for PMV evaluation as patient attempting trach collar trials. MD please order if agree.    [name] MA, CCC-SLP [phone]

## 2017-03-02 NOTE — Progress Notes (Signed)
  Echocardiogram Echocardiogram Transesophageal has been performed.  [name] 03/02/2017, 1:56 PM

## 2017-03-02 NOTE — Progress Notes (Signed)
PULMONARY / CRITICAL CARE MEDICINE   Name: [name] MRN: [id number] DOB: 08/13/1981    ADMISSION DATE:  02/14/2017 CONSULTATION DATE:  02/14/2017  REFERRING MD:  Feliz Beam MD  CHIEF COMPLAINT:  Sepsis, cardiac arrest  BRIEF SUMMARY:   36 year old female w/ ESRD d/t poorly controlled DM, bilateral transmetatarsal foot amputations, obesity, HTN, medical non-compliance as well as NICM. Admitted 5/19 with complaints of left leg pain, found to have radiological evidence of osteomyelitis with wound nonhealing, dehiscence, purulent drainage from the wound. Admitted for IV antibiotics, hemodialysis and possible surgery.  Found unresponsive in the room on 5/19. She had chest compressions, epi bicarb given. She was intubated by anesthesia and had ROSC in approximately 9 minutes. Downtime prior to code is unknown. She got dilaudid in the afternoon for leg pain.  SUBJECTIVE:  RN reports pt intermittently alert, completed HD this am.  Afebrile.  WBC 22.4 (down from 24).  Weaned on ATC ~ 1.5 hours  VITAL SIGNS: BP 124/89   Pulse (!) 107   Temp 98.7 F (37.1 C) (Oral)   Resp (!) 23   Ht 5' 8.5" (1.74 m)   Wt 229 lb 8 oz (104.1 kg)   LMP  (LMP Unknown) Comment: Patient trached  SpO2 100%   BMI 34.39 kg/m   VENTILATOR SETTINGS: Vent Mode: PRVC FiO2 (%):  [30 %-50 %] 40 % Set Rate:  [16 bmp] 16 bmp Vt Set:  [520 mL] 520 mL PEEP:  [5 cmH20] 5 cmH20 Plateau Pressure:  [22 cmH20] 22 cmH20  INTAKE / OUTPUT: I/O last 3 completed shifts: In: 1790 [I.V.:370; NG/GT:570; IV Piggyback:850] Out: 300 [Stool:300]  General: chronically ill appearing female in NAD on ATC, appears older than stated age   HEENT: MM pink/moist, trach midline c/d/i Neuro: drowsy, opens eyes to voice, intermittently follows simple commands  CV: s1s2 with rub PULM: even/non-labored, lungs bilaterally coarse TJ:QZES, non-tender, bsx4 active  Extremities: warm/dry, no edema  Skin: no rashes or lesions,  tattoos  LABS:  BMET  Recent Labs Lab 03/01/17 0243 03/01/17 0956 03/02/17 0239  NA 136 136 137  K 3.8 3.7 3.6  CL 95* 98* 98*  CO2 26 25 26   BUN 36* 45* 67*  CREATININE 3.30* 3.61* 4.57*  GLUCOSE 177* 179* 155*    Electrolytes  Recent Labs Lab 02/24/17 0904  03/01/17 0243 03/01/17 0956 03/02/17 0239  CALCIUM 9.4  < > 8.2* 8.5* 8.6*  PHOS 2.8  --   --  2.8 3.5  < > = values in this interval not displayed.  CBC  Recent Labs Lab 02/27/17 0349 02/28/17 0309 03/02/17 0239  WBC 21.1* 24.1* 22.4*  HGB 8.3* 9.1* 8.5*  HCT 27.6* 29.9* 28.2*  PLT 593* 614* 732*    Coag's No results for input(s): APTT, INR in the last 168 hours.  Sepsis Markers No results for input(s): LATICACIDVEN, PROCALCITON, O2SATVEN in the last 168 hours.  ABG No results for input(s): PHART, PCO2ART, PO2ART in the last 168 hours.  Liver Enzymes  Recent Labs Lab 02/26/17 0515 03/01/17 0956 03/02/17 0239  AST 25  --   --   ALT 12*  --   --   ALKPHOS 558*  --   --   BILITOT 3.5*  --   --   ALBUMIN 1.7* 2.0* 1.9*    Cardiac Enzymes No results for input(s): TROPONINI, PROBNP in the last 168 hours.  Glucose  Recent Labs Lab 03/01/17 1545 03/01/17 1939 03/01/17 2345 03/02/17 0358 03/02/17  6962 03/02/17 1136  GLUCAP 162* 127* 141* 139* 151* 117*   STUDIES:  Lower extremity ultrasound 5/18 >> no evidence of DVT CT head 5/19 >> normal exam Echo 5/20 >> EF 30-35%, increased LVF. Diffuse hypokinesis, akinesis of basilar mid-inferior myocardium, grade 2 diastolic dysfxn  EEG 5/20 >> finding c/w mod to severe global cerebral dysfxn. C/w anoxic injury given clinical course  LE [place] 5/21 >> negative MRI brain 5/24 >> ?thrombosed cortical vein, otherwise normal TEE 6/4 >>   CULTURES: Bcx 5/19 X 2 >> negative Blood cultures 6/1 >> c diff 6/1 >> negative  ANTIBIOTICS: Clinidamycin 5/19 >> 5/21 Vancomycin 5/19 >> 5/25 >> restart 5/30 Zosyn 5/19 >> 5/22 Meropenem 5/22 >>  5/30 Flagyl 5/30 >> Ceftriaxone 5/30 >>  SIGNIFICANT EVENTS: 5/19  Admit, cardiac arrest, transfer to ICU; hypothermia protocol started 5/24  Off pressors.  MRI w/out evidence of ischemia  6/03  Tolerated ATC couple hours, then back to vent, HD   LINES/TUBES: ETT 5/19 >> Lt IJ CVL 5/19 >> out Trach 5/31 >>   ASSESSMENT / PLAN:  Discussion:  36 y/o F with PMH of poorly controlled DM, ESRD on HD and known gangrene of L achilles heel, admitted with non-healing wounds.  Admitted per TRH.  Received dilaudid for pain and subsequently suffered a cardiac arrest. Tx to ICU.   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 Hx of HLD Pericardial Rub - endocarditis ruled out  P: Appreciate Cardiology assistance with TEE Continue coreg  Hold isosorbide TEE prelim with reduced LVEF, pericardial effusion and no endocarditis, await final reading  Renal: ESRD HD per renal   GI: Moderate protein calorie malnutrition Diarrhea c diff negative P: Continue TF IR for PEG tube placement > waiting reduction of WBC with concern for seeding  Hematology Anemia of critical illness and chronic disease P:  Trend CBC  Aranesp   Infectious: Lt foot osteomyelitis. Per ID recommend total 10 days of antibiotics.  Continue ceftriaxone, flagyl plus vancomycin. Endocarditis Ruled Out 6/4 on TEE Diarrhea P:  Ortho following, appreciate Dr. [name].  Pt has refused amputation for years.    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 PCCM will follow for trach care.   CC Time: 30 minutes   Canary Brim, NP-C [facility] Pgr: [phone] or if no answer [phone] 03/02/2017, 1:44 PM

## 2017-03-02 NOTE — Progress Notes (Signed)
IR aware of request for g-tube.  The patient's WBC is currently in the low 20s and likely coming from the gangrene and necrosis of her foot.  This case was reviewed with Dr. Bonnielee Haff.  He would not recommend pursuing g-tube placement until her WBC began to decrease due to risk for seeding infection.  She also has a new cardiac murmur and leukocytosis could be from this as well.  We will follow her WBC and can plan for attempted g-tube placement once her leukocytosis improves.  She will need at least 24hrs prior to procedure of barium.  Her liver covers much of her stomach.  We will follow.  [name] E 2:01 PM 03/02/2017

## 2017-03-03 ENCOUNTER — Inpatient Hospital Stay (HOSPITAL_COMMUNITY): Payer: Medicaid Other

## 2017-03-03 DIAGNOSIS — I10 Essential (primary) hypertension: Secondary | ICD-10-CM

## 2017-03-03 DIAGNOSIS — E440 Moderate protein-calorie malnutrition: Secondary | ICD-10-CM

## 2017-03-03 DIAGNOSIS — M86172 Other acute osteomyelitis, left ankle and foot: Secondary | ICD-10-CM

## 2017-03-03 DIAGNOSIS — E1021 Type 1 diabetes mellitus with diabetic nephropathy: Secondary | ICD-10-CM

## 2017-03-03 DIAGNOSIS — E46 Unspecified protein-calorie malnutrition: Secondary | ICD-10-CM

## 2017-03-03 LAB — BASIC METABOLIC PANEL
ANION GAP: 12 (ref 5–15)
ANION GAP: 18 — AB (ref 5–15)
BUN: 42 mg/dL — AB (ref 6–20)
BUN: 42 mg/dL — ABNORMAL HIGH (ref 6–20)
CHLORIDE: 96 mmol/L — AB (ref 101–111)
CO2: 23 mmol/L (ref 22–32)
CO2: 26 mmol/L (ref 22–32)
Calcium: 8.3 mg/dL — ABNORMAL LOW (ref 8.9–10.3)
Calcium: 8.4 mg/dL — ABNORMAL LOW (ref 8.9–10.3)
Chloride: 95 mmol/L — ABNORMAL LOW (ref 101–111)
Creatinine, Ser: 3.11 mg/dL — ABNORMAL HIGH (ref 0.44–1.00)
Creatinine, Ser: 3.12 mg/dL — ABNORMAL HIGH (ref 0.44–1.00)
GFR calc Af Amer: 21 mL/min — ABNORMAL LOW (ref >60)
GFR calc non Af Amer: 18 mL/min — ABNORMAL LOW (ref >60)
GFR, EST AFRICAN AMERICAN: 21 mL/min — AB (ref >60)
GFR, EST NON AFRICAN AMERICAN: 18 mL/min — AB (ref >60)
GLUCOSE: 157 mg/dL — AB (ref 65–99)
Glucose, Bld: 191 mg/dL — ABNORMAL HIGH (ref 65–99)
POTASSIUM: 4 mmol/L (ref 3.5–5.1)
POTASSIUM: 4.5 mmol/L (ref 3.5–5.1)
SODIUM: 134 mmol/L — AB (ref 135–145)
Sodium: 136 mmol/L (ref 135–145)

## 2017-03-03 LAB — CBC
HEMATOCRIT: 28.3 % — AB (ref 36.0–46.0)
Hemoglobin: 8.5 g/dL — ABNORMAL LOW (ref 12.0–15.0)
MCH: 29.3 pg (ref 26.0–34.0)
MCHC: 30 g/dL (ref 30.0–36.0)
MCV: 97.6 fL (ref 78.0–100.0)
Platelets: 672 10*3/uL — ABNORMAL HIGH (ref 150–400)
RBC: 2.9 MIL/uL — AB (ref 3.87–5.11)
RDW: 17 % — ABNORMAL HIGH (ref 11.5–15.5)
WBC: 21.7 10*3/uL — AB (ref 4.0–10.5)

## 2017-03-03 LAB — GLUCOSE, CAPILLARY
GLUCOSE-CAPILLARY: 153 mg/dL — AB (ref 65–99)
GLUCOSE-CAPILLARY: 163 mg/dL — AB (ref 65–99)
GLUCOSE-CAPILLARY: 198 mg/dL — AB (ref 65–99)
Glucose-Capillary: 145 mg/dL — ABNORMAL HIGH (ref 65–99)
Glucose-Capillary: 134 mg/dL — ABNORMAL HIGH (ref 65–99)
Glucose-Capillary: 181 mg/dL — ABNORMAL HIGH (ref 65–99)

## 2017-03-03 LAB — PHOSPHORUS
PHOSPHORUS: 2.6 mg/dL (ref 2.5–4.6)
Phosphorus: 2.7 mg/dL (ref 2.5–4.6)

## 2017-03-03 LAB — MAGNESIUM
Magnesium: 1.9 mg/dL (ref 1.7–2.4)
Magnesium: 1.9 mg/dL (ref 1.7–2.4)

## 2017-03-03 LAB — ALBUMIN: Albumin: 2 g/dL — ABNORMAL LOW (ref 3.5–5.0)

## 2017-03-03 MED ORDER — MAGNESIUM SULFATE IN D5W 1-5 GM/100ML-% IV SOLN
1.0000 g | Freq: Once | INTRAVENOUS | Status: AC
  Administered 2017-03-03: 1 g via INTRAVENOUS
  Filled 2017-03-03: qty 100

## 2017-03-03 MED ORDER — DARBEPOETIN ALFA 200 MCG/0.4ML IJ SOSY
PREFILLED_SYRINGE | INTRAMUSCULAR | Status: AC
  Filled 2017-03-03: qty 0.4

## 2017-03-03 MED ORDER — VANCOMYCIN HCL IN DEXTROSE 1-5 GM/200ML-% IV SOLN
INTRAVENOUS | Status: AC
  Filled 2017-03-03: qty 200

## 2017-03-03 NOTE — Progress Notes (Signed)
PULMONARY / CRITICAL CARE MEDICINE   Name: [name] MRN: [id number] DOB: [birth year]    ADMISSION DATE:  02/14/2017 CONSULTATION DATE:  02/14/2017  REFERRING MD:  Feliz Beam MD  CHIEF COMPLAINT:  Sepsis, cardiac arrest  BRIEF SUMMARY:   36 year old female w/ ESRD d/t poorly controlled DM, bilateral transmetatarsal foot amputations, obesity, HTN, medical non-compliance as well as NICM. Admitted 5/19 with complaints of left leg pain, found to have radiological evidence of osteomyelitis with wound nonhealing, dehiscence, purulent drainage from the wound. Admitted for IV antibiotics, hemodialysis and possible surgery.  Found unresponsive in the room on 5/19. She had chest compressions, epi bicarb given. She was intubated by anesthesia and had ROSC in approximately 9 minutes. Downtime prior to code is unknown. She got dilaudid in the afternoon for leg pain.  SUBJECTIVE:   RN reports no acute events. HD completed this am.  Pending ATC wean.  Mother at bedside > reports distrust for surgery as they were told the last "would take care of everything".  They feel she will continue to lose her legs "a little bit a time"   VITAL SIGNS: BP 104/78 (BP Location: Right Arm)   Pulse (!) 103   Temp 98.1 F (36.7 C) (Oral)   Resp (!) 21   Ht 5' 8.5" (1.74 m)   Wt 218 lb 0.6 oz (98.9 kg)   LMP  (LMP Unknown) Comment: Patient trached  SpO2 100%   BMI 32.67 kg/m   VENTILATOR SETTINGS: Vent Mode: PSV;CPAP FiO2 (%):  [30 %-40 %] 30 % Set Rate:  [16 bmp] 16 bmp Vt Set:  [520 mL] 520 mL PEEP:  [5 cmH20] 5 cmH20 Pressure Support:  [8 cmH20] 8 cmH20 Plateau Pressure:  [7 cmH20-22 cmH20] 22 cmH20  INTAKE / OUTPUT: I/O last 3 completed shifts: In: 1978 [I.V.:360; NG/GT:568; IV Piggyback:1050] Out: 0   General: chronically ill appearing female in NAD on vent, weaning on PSV 8/5 HEENT: MM pink/moist, trach midline c/d/i Neuro: lethargic, moves intermittently spontaneously, intermittently will follow  commands CV: s1s2 rrr, no m/r/g PULM: even/non-labored, lungs bilaterally clear anterior, diminished lower  KD:XIPJ, non-tender, bsx4 active  Extremities: warm/dry, trace generalized edema  Skin: no rashes or lesions, multiple tattoos    LABS:  BMET  Recent Labs Lab 03/02/17 0239 03/02/17 2357 03/03/17 0252  NA 137 134* 136  K 3.6 4.0 4.5  CL 98* 96* 95*  CO2 26 26 23   BUN 67* 42* 42*  CREATININE 4.57* 3.12* 3.11*  GLUCOSE 155* 191* 157*    Electrolytes  Recent Labs Lab 03/02/17 0239 03/02/17 2357 03/03/17 0252  CALCIUM 8.6* 8.3* 8.4*  MG  --  1.9 1.9  PHOS 3.5 2.6 2.7    CBC  Recent Labs Lab 02/28/17 0309 03/02/17 0239 03/03/17 0252  WBC 24.1* 22.4* 21.7*  HGB 9.1* 8.5* 8.5*  HCT 29.9* 28.2* 28.3*  PLT 614* 732* 672*    Coag's No results for input(s): APTT, INR in the last 168 hours.  Sepsis Markers No results for input(s): LATICACIDVEN, PROCALCITON, O2SATVEN in the last 168 hours.  ABG No results for input(s): PHART, PCO2ART, PO2ART in the last 168 hours.  Liver Enzymes  Recent Labs Lab 02/26/17 0515 03/01/17 0956 03/02/17 0239 03/03/17 0252  AST 25  --   --   --   ALT 12*  --   --   --   ALKPHOS 558*  --   --   --   BILITOT 3.5*  --   --   --  ALBUMIN 1.7* 2.0* 1.9* 2.0*    Cardiac Enzymes No results for input(s): TROPONINI, PROBNP in the last 168 hours.  Glucose  Recent Labs Lab 03/02/17 1136 03/02/17 1531 03/02/17 2023 03/02/17 2347 03/03/17 0357 03/03/17 0817  GLUCAP 117* 152* 148* 178* 163* 153*   STUDIES:  Lower extremity ultrasound 5/18 >> no evidence of DVT CT head 5/19 >> normal exam Echo 5/20 >> EF 30-35%, increased LVF. Diffuse hypokinesis, akinesis of basilar mid-inferior myocardium, grade 2 diastolic dysfxn  EEG 5/20 >> finding c/w mod to severe global cerebral dysfxn. C/w anoxic injury given clinical course  LE [place] 5/21 >> negative MRI brain 5/24 >> ?thrombosed cortical vein, otherwise normal TEE 6/4 >>  mild MR, normal AV, mild TR, mild PR, EF 30-35%, diffuse hypokinesis, no thrombus, no PFO, normal RV, moderate pericardial effusion with synechia suggesting some chronicity, no vegetations   CULTURES: Bcx 5/19 X 2 >> negative Blood cultures 6/1 >> c diff 6/1 >> negative  ANTIBIOTICS: Clinidamycin 5/19 >> 5/21 Vancomycin 5/19 >> 5/25 >> restart 5/30 Zosyn 5/19 >> 5/22 Meropenem 5/22 >> 5/30 Flagyl 5/30 >> Ceftriaxone 5/30 >>  SIGNIFICANT EVENTS: 5/19  Admit, cardiac arrest, transfer to ICU; hypothermia protocol started 5/24  Off pressors.  MRI w/out evidence of ischemia  6/03  Tolerated ATC couple hours, then back to vent, HD   LINES/TUBES: ETT 5/19 >> Lt IJ CVL 5/19 >> out Trach 5/31 >>   ASSESSMENT / PLAN:  Discussion:  36 y/o F with PMH of poorly controlled DM, ESRD on HD and known gangrene of L achilles heel, admitted with non-healing wounds.  Admitted per [facility].  Received dilaudid for pain and subsequently suffered a cardiac arrest. Tx to ICU.   Pulmonary: Acute respiratory failure - in setting of cardiac arrest, chronic critical illness, requiring ventilator for support, making progress with weaning P: ATC as tolerated  Trach care per protocol  Clip trach sutures 6/9 PRVC as rest mode  Push PT efforts  Intermittent CXR   All other medical issues below per TRH, appreciate assistance.  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 Per ID recommend total 10 days of antibiotics.  Continue ceftriaxone, flagyl plus vancomycin. 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 PCCM will follow for trach care.      Canary Brim, NP-C [facility] Pgr: [phone] or if no answer [phone] 03/03/2017, 11:26 AM

## 2017-03-03 NOTE — Procedures (Signed)
I was present at this dialysis session. I have reviewed the session itself and made appropriate changes.   HD today on schedule.  2K bath with K =4.5.  Using AVF.  UF goal 4L. TEE report reviewed, has rub, mod pericardial effusion; likely chronic.  RCID notes reviewed.   Low grade fevers; WBC stable around 21.    Next HD planned for 6/7 on [facility] schedule.   Filed Weights   03/02/17 0500 03/02/17 0745 03/03/17 0450  Weight: 103.3 kg (227 lb 11.8 oz) 104.1 kg (229 lb 8 oz) 98.9 kg (218 lb 0.6 oz)     Recent Labs Lab 03/03/17 0252  NA 136  K 4.5  CL 95*  CO2 23  GLUCOSE 157*  BUN 42*  CREATININE 3.11*  CALCIUM 8.4*  PHOS 2.7     Recent Labs Lab 02/28/17 0309 03/02/17 0239 03/03/17 0252  WBC 24.1* 22.4* 21.7*  HGB 9.1* 8.5* 8.5*  HCT 29.9* 28.2* 28.3*  MCV 96.5 96.2 97.6  PLT 614* 732* 672*    Scheduled Meds: . calcium carbonate (dosed in mg elemental calcium)  2,000 mg of elemental calcium Oral TID  . chlorhexidine gluconate (MEDLINE KIT)  15 mL Mouth Rinse BID  . Darbepoetin Alfa      . darbepoetin (ARANESP) injection - DIALYSIS  200 mcg Intravenous Q Tue-HD  . feeding supplement (NEPRO CARB STEADY)  1,000 mL Per Tube Q24H  . feeding supplement (PRO-STAT SUGAR FREE 64)  60 mL Per Tube QID  . [name]'s butt cream   Topical BID  . heparin  5,000 Units Subcutaneous Q8H  . insulin aspart  0-15 Units Subcutaneous Q4H  . mouth rinse  15 mL Mouth Rinse QID  . metoprolol succinate  12.5 mg Oral QHS  . multivitamin  1 tablet Oral QHS  . pantoprazole sodium  40 mg Per Tube Q24H   Continuous Infusions: . sodium chloride 10 mL/hr at 03/02/17 1700  . sodium chloride    . sodium chloride    . sodium chloride    . sodium chloride    . cefTRIAXone (ROCEPHIN)  IV Stopped (03/02/17 1337)  . metronidazole Stopped (03/03/17 0514)  . vancomycin    . vancomycin Stopped (02/28/17 1139)   PRN Meds:.sodium chloride, sodium chloride, sodium chloride, sodium chloride,  acetaminophen (TYLENOL) oral liquid 160 mg/5 mL, albuterol, alteplase, fentaNYL (SUBLIMAZE) injection, heparin, heparin, heparin, lidocaine (PF), lidocaine-prilocaine, pentafluoroprop-tetrafluoroeth   Pearson Grippe  MD 03/03/2017, 8:53 AM

## 2017-03-03 NOTE — Progress Notes (Signed)
PROGRESS NOTE    [name]  PIR:[id number] DOB: Sep 25, 1981 DOA: 02/14/2017 PCP: Vicenta Aly, FNP   Brief Narrative:  Patient is a 36 year old female end-stage renal disease on hemodialysis secondary to poorly controlled diabetes mellitus, bilateral transmetatarsal foot amputations, obesity, hypertension, medical noncompliance, nonischemic cardiomyopathy who was admitted 02/14/2017 with left leg pain and found to have osteomyelitis with wound nonhealing, dehiscence, purulent drainage from the wound. Patient placed empirically on IV antibiotics, orthopedics and nephrology consulted. Patient was found unresponsive in the room on 02/14/2017 after getting IV Dilaudid in the afternoon for leg pain. Patient had a 9+ minute asystole cardiac arrest was intubated and transferred to the ICU and underwent colon and rewarming. Patient with no significant neurological improvement and was assessed by neurology who felt that given patient's current neurological status and length of time she's been off sedation and: Protocol prognosis for meaningful neurological recovery was very dismal. Patient also seen in consultation by ID and placed empirically on IV antibiotics which have been D escalated to IV Rocephin, Flagyl and vancomycin. Patient has been followed by nephrology and receiving hemodialysis during the hospitalization. Orthopedics consulted and patient seen by Dr. Sharol Given and is noted that patient has a dry gangrene involving the heel and Achilles of her left foot. It is noted that patient has a dry gangrene S with exposed bone. Recommendations from orthopedics was to proceed with a transtibial amputation which has been discussed multiple times in the office prior to admission. Orthopedics patient does not want to proceed with transtibial amputation and mother reinforcing patient's wishes at this time. Patient currently on empiric IV antibiotics.   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 Continue trach care protocol. Per critical care medicine.  #2 asystolic cardiac arrest/acute on chronic combined CHF EF of 30-35%/moderate pericardial effusion Patient status post asystolic cardiac arrest currently extubated, with gangrenous left foot. Patient status post transesophageal echocardiogram with a reduced ejection fraction of my moderate pericardial effusion and negative for vegetation/endocarditis. Patient on hemodialysis. Continue Toprol-XL.  #3 end-stage renal disease on hemodialysis Patient currently on hemodialysis. Per renal  #4 diarrhea C. difficile PCR negative.  #5 moderate protein calorie malnutrition Patient currently getting tube feeds. IR was consulted for PEG tube placement however due to patient's leukocytosis and intermittent fevers IR holding off on PEG tube secondary to concern for seeding.  #6 anemia of chronic disease H&H stable. Follow.  #7 left foot osteomyelitis/gangrene  Patient has been seen in consultation by orthopedics, Dr. Sharol Given who has recommended amputation however patient refusing amputation wishes done for several years now. Patient currently  empirically on IV Rocephin, Flagyl and vancomycin with recommendations from  ID to continue for a total of 10 days. TEE negative for endocarditis. Per orthopedics. Follow.  #8 acute metabolic encephalopathy/anoxic encephalopathy post cardiac arrest Patient opens eyes with some tracking and occasionally following some commands. Patient had 9+ asystole EEG done consistent with moderate to severe global dysfunction. Patient also with a left foot osteomyelitis and gangrene. Continue empiric IV antibiotics.  #9 diabetes mellitus type 2 with neuropathy Sliding scale insulin.  #10 leukocytosis Likely secondary to left foot osteomyelitis/gangrene. Patient blood cultures have been drawn with no growth to date. Patient spiking fevers intermittently which were ID likely central in nature. Continue empiric IV antibiotics to complete a 10 course of regimen per ID.     DVT prophylaxis: Heparin Code Status: Full Family Communication: No family at bedside. Disposition Plan: Remain in the ICU/stepdown.   Consultants:   Infectious disease: Dr. Graylon Good 02/24/2017  Neurology Dr.Lindzen 02/24/2017  Gastroenterology: Dr. Watt Climes 02/19/2017  Wound care  PCCM: Dr Vaughan Browner 02/14/2017  Nephrology: Dr.Schertz 02/14/2017  Orthopedics: Dr. Durward Fortes 02/14/2017  Orthopedics: Dr. Sharol Given  Procedures:   CT head 02/14/2017  CT abdomen and pelvis 03/01/2017  Right upper quadrant ultrasound 02/18/2017  TEE 03/02/2017  MRI brain 02/18/2017  Lower extremity Dopplers 02/13/2017 negative for DVT  EEG 02/15/2017>>>> findings consistent with moderate to severe global cerebral dysfunction. Consistent with anoxic injury given clinical course  Antimicrobials:  Clinidamycin 5/19 >> 5/21 Vancomycin 5/19 >> 5/25 >> restart 5/30 Zosyn 5/19 >> 5/22 Meropenem 5/22 >> 5/30 Flagyl 5/30 >> Ceftriaxone 5/30 >>  CULTURES: Bcx 5/19 X 2 >> negative Blood cultures 6/1 >> c diff 6/1 >> negative  SIGNIFICANT  EVENTS: 5/19  Admit, cardiac arrest, transfer to ICU; hypothermia protocol started 5/24  Off pressors.  MRI w/out evidence of ischemia  6/03  Tolerated ATC couple hours, then back to vent, HD     Subjective: Patient alert. Following some commands. Nods head to answer some questions. Nonverbal at this point in time.  Objective: Vitals:   03/03/17 0826 03/03/17 0830 03/03/17 0900 03/03/17 0930  BP: 117/77 109/81 113/80 111/78  Pulse: (!) 103 100 (!) 105 (!) 105  Resp: (!) 27 (!) 23 (!) 32 (!) 23  Temp:      TempSrc:      SpO2:  100% 100% 100%  Weight:      Height:        Intake/Output Summary (Last 24 hours) at 03/03/17 1002 Last data filed at 03/03/17 0800  Gross per 24 hour  Intake             1198 ml  Output                0 ml  Net             1198 ml   Filed Weights   03/02/17 0500 03/02/17 0745 03/03/17 0450  Weight: 103.3 kg (227 lb 11.8 oz) 104.1 kg (229 lb 8 oz) 98.9 kg (218 lb 0.6 oz)    Examination:  General exam: Alert Respiratory system: Clear to auscultation anterior lung fields. Respiratory effort normal. Cardiovascular system: S1 & S2 heard, RRR. No JVD, murmurs, rubs, gallops or clicks. No pedal edema. Gastrointestinal system: Abdomen is nondistended, soft and nontender. No organomegaly or masses felt. Normal bowel sounds heard. Central nervous system: Alert. Moving extremities spontaneously. No focal neurological deficits. Extremities: BLE in heel protectors. L  foot bandaged Skin: No rashes, lesions or ulcers Psychiatry: Judgement and insight appear poor. Mood & affect flat.  Data Reviewed: I have personally reviewed following labs and imaging studies  CBC:  Recent Labs Lab 02/26/17 0515 02/27/17 0349 02/28/17 0309 03/02/17 0239 03/03/17 0252  WBC 22.2* 21.1* 24.1* 22.4* 21.7*  HGB 8.3* 8.3* 9.1* 8.5* 8.5*  HCT 27.6* 27.6* 29.9* 28.2* 28.3*  MCV 96.5 96.2 96.5 96.2 97.6  PLT 566* 593* 614* 732* 446*   Basic Metabolic  Panel:  Recent Labs Lab 03/01/17 0243 03/01/17 0956 03/02/17 0239 03/02/17 2357 03/03/17 0252  NA 136 136 137 134* 136  K 3.8 3.7 3.6 4.0 4.5  CL 95* 98* 98* 96* 95*  CO2 _0 GLUCOSE 177* 179* 155* 191* 157*  BUN 36* 45* 67* 42* 42*  CREATININE 3.30* 3.61* 4.57* 3.12* 3.11*  CALCIUM 8.2* 8.5* 8.6* 8.3* 8.4*  MG  --   --   --  1.9 1.9  PHOS  --  2.8 3.5 2.6 2.7   GFR: Estimated Creatinine Clearance: 30.7 mL/min (A) (by C-G formula based on SCr of 3.11 mg/dL (H)). Liver Function Tests:  Recent Labs Lab 02/26/17 0515 03/01/17 0956 03/02/17 0239 03/03/17 0252  AST 25  --   --   --   ALT 12*  --   --   --   ALKPHOS 558*  --   --   --   BILITOT 3.5*  --   --   --   PROT 5.7*  --   --   --   ALBUMIN 1.7* 2.0* 1.9* 2.0*   No results for input(s): LIPASE, AMYLASE in the last 168 hours. No results for input(s): AMMONIA in the last 168 hours. Coagulation Profile: No results for input(s): INR, PROTIME in the last 168 hours. Cardiac Enzymes: No results for input(s): CKTOTAL, CKMB, CKMBINDEX, TROPONINI in the last 168 hours. BNP (last 3 results) No results for input(s): PROBNP in the last 8760 hours. HbA1C: No results for input(s): HGBA1C in the last 72 hours. CBG:  Recent Labs Lab 03/02/17 1531 03/02/17 2023 03/02/17 2347 03/03/17 0357 03/03/17 0817  GLUCAP 152* 148* 178* 163* 153*   Lipid Profile: No results for input(s): CHOL, HDL, LDLCALC, TRIG, CHOLHDL, LDLDIRECT in the last 72 hours. Thyroid Function Tests: No results for input(s): TSH, T4TOTAL, FREET4, T3FREE, THYROIDAB in the last 72 hours. Anemia Panel: No results for input(s): VITAMINB12, FOLATE, FERRITIN, TIBC, IRON, RETICCTPCT in the last 72 hours. Sepsis Labs: No results for input(s): PROCALCITON, LATICACIDVEN in the last 168 hours.  Recent Results (from the past 240 hour(s))  C difficile quick scan w PCR reflex     Status: None   Collection Time: 02/27/17  9:41 AM  Result Value Ref  Range Status   C Diff antigen NEGATIVE NEGATIVE Final   C Diff toxin NEGATIVE NEGATIVE Final   C Diff interpretation No C. difficile detected.  Final  Culture, blood (Routine X 2) w Reflex to ID Panel     Status: None (Preliminary result)   Collection Time: 02/27/17 10:39 AM  Result Value Ref Range Status   Specimen Description BLOOD RIGHT HAND  Final   Special Requests IN PEDIATRIC BOTTLE Blood Culture adequate volume  Final   Culture NO GROWTH 3 DAYS  Final   Report Status PENDING  Incomplete  Culture, blood (Routine X 2) w Reflex to ID Panel     Status: None (Preliminary result)   Collection Time: 02/27/17 10:49 AM  Result Value Ref Range Status   Specimen Description BLOOD RIGHT HAND  Final  Special Requests IN PEDIATRIC BOTTLE Blood Culture adequate volume  Final   Culture NO GROWTH 3 DAYS  Final   Report Status PENDING  Incomplete         Radiology Studies: Ct Abdomen Wo Contrast  Result Date: 03/02/2017 CLINICAL DATA:  Preop for gastrostomy to EXAM: CT ABDOMEN WITHOUT CONTRAST TECHNIQUE: Multidetector CT imaging of the abdomen was performed following the standard protocol without IV contrast. COMPARISON:  None. FINDINGS: Lower chest: Bibasilar dependent and medial consolidation. Large pericardial effusion. Hepatobiliary: The left lobe of the liver is prominent and anterior to the stoma. The gallbladder is not clearly visualized. It may either be decompressed or absent due to cholecystectomy. Pancreas: Unremarkable Spleen: Unremarkable Adrenals/Urinary Tract: Kidneys and adrenal glands are within normal limits. Stomach/Bowel: The stomach is positioned deep to the liver and colon. Gastrostomy tube placement may be problematic. No evidence of small-bowel obstruction. Feeding tube tip is in the proximal duodenum. Vascular/Lymphatic: Small para-aortic lymph nodes. Atherosclerotic vascular calcifications are noted. No evidence of aortic aneurysm. Other: No free-fluid. Musculoskeletal: No  vertebral compression deformity. IMPRESSION: The stomach is positioned deep to a prominent left lobe of the liver as well as the transverse colon. Gas is distension of the stomach and barium opacification of the transverse colon will be essential during the procedure. Bibasilar pulmonary consolidation. Large pericardial effusion. Electronically Signed   By: Marybelle Killings M.D.   On: 03/02/2017 07:18   Dg Chest Port 1 View  Result Date: 03/03/2017 CLINICAL DATA:  Respiratory failure. EXAM: PORTABLE CHEST 1 VIEW COMPARISON:  03/01/2017 . FINDINGS: Tracheostomy tube and feeding tube in stable position. Cardiomegaly with mild diffuse interstitial prominence suggesting mild CHF. Persistent atelectasis and consolidation left lower lobe. Small left pleural effusion cannot be excluded. No pneumothorax. IMPRESSION: 1. Tracheostomy tube and feeding tube in stable position. 2. Cardiomegaly with diffuse mild interstitial prominence suggesting mild CHF. 3. Persistent left lower lobe atelectasis and consolidation. Small left pleural effusion cannot be excluded . Electronically Signed   By: Marcello Moores  Register   On: 03/03/2017 07:09   Dg Abd Portable 1v  Result Date: 03/02/2017 CLINICAL DATA:  Encounter for feeding tube placement EXAM: PORTABLE ABDOMEN - 1 VIEW COMPARISON:  Portable exam 1712 hours compared to CT abdomen and pelvis of 03/01/2017 FINDINGS: Feeding tube traverses abdomen with tip projecting over distal antrum near pylorus. Cardiac silhouette appears enlarged. Visualized bowel gas pattern normal. Osseous structures unremarkable. IMPRESSION: Tip of feeding tube projects over distal gastric antrum near pylorus. Electronically Signed   By: Lavonia Dana M.D.   On: 03/02/2017 17:21        Scheduled Meds: . calcium carbonate (dosed in mg elemental calcium)  2,000 mg of elemental calcium Oral TID  . chlorhexidine gluconate (MEDLINE KIT)  15 mL Mouth Rinse BID  . darbepoetin (ARANESP) injection - DIALYSIS  200 mcg  Intravenous Q Tue-HD  . feeding supplement (NEPRO CARB STEADY)  1,000 mL Per Tube Q24H  . feeding supplement (PRO-STAT SUGAR FREE 64)  60 mL Per Tube QID  . [name]'s butt cream   Topical BID  . heparin  5,000 Units Subcutaneous Q8H  . insulin aspart  0-15 Units Subcutaneous Q4H  . mouth rinse  15 mL Mouth Rinse QID  . metoprolol succinate  12.5 mg Oral QHS  . multivitamin  1 tablet Oral QHS  . pantoprazole sodium  40 mg Per Tube Q24H   Continuous Infusions: . sodium chloride 10 mL/hr at 03/03/17 0800  . sodium chloride    .  sodium chloride    . sodium chloride    . sodium chloride    . cefTRIAXone (ROCEPHIN)  IV Stopped (03/02/17 1337)  . metronidazole Stopped (03/03/17 0514)  . vancomycin 1,000 mg (03/03/17 0936)     LOS: 17 days    Time spent: 25 mins    THOMPSON,DANIEL, MD [facility] Pager [phone]  If 7PM-7AM, please contact night-coverage [email or web address] Password [email or web address] 03/03/2017, 10:02 AM

## 2017-03-03 NOTE — Progress Notes (Signed)
Patient placed on 40% trach collar.  Currently tolerating well.  Will continue to monitor.  

## 2017-03-03 NOTE — Care Management Note (Signed)
Case Management Note Original Note Created by Lawerance Sabal  Patient Details  Name: [name] MRN: [id number] Date of Birth: 1981-07-07  Subjective/Objective:  Pt admitted- 5/19 with complaints of left leg pain, found to have evidence of osteomyelitis with wound nonhealing, dehiscence, purulent drainage from the wound. Admitted for IV antibiotics, hemodialysis and possible surgery. Found unresponsive in the room on 5/19 - cardiac arrest- intubated      Patient currently on ventilator via trach with significantly lower mental status since code. Patient unable to care for herself and will require long term care at discharge. Patient from home and independent pta. Main support since hospitalized has been disabled mother who has stayed at bedside in ICU. Mother in [facility] with limited resources to care for self. Per nursing staff has been showering at hospital and washing her undergarments in sink, and unable to secure food for herself while here. CM will continue to follow for LTAC needs as they arise and will place consult for CSW to assist in SNF placement.            Action/Plan: PTA pt lived at home- Hx - ESRD- HD-T/T/S- CM to follow-  Expected Discharge Date:                  Expected Discharge Plan:     In-House Referral:  Clinical Social Work  Discharge planning Services  CM Consult  Post Acute Care Choice:    Choice offered to:     DME Arranged:    DME Agency:     HH Arranged:    HH Agency:     Status of Service:  In process, will continue to follow  If discussed at Long Length of Stay Meetings, dates discussed:    Additional Comments: Pt remains on ventilator in ICU via trach.  Pt does not have LTACH benefits with current insurance - plan is vent SNF.  CSW actively seeking placement Cherylann Parr, RN 03/03/2017, 10:16 AM

## 2017-03-04 DIAGNOSIS — Z515 Encounter for palliative care: Secondary | ICD-10-CM

## 2017-03-04 DIAGNOSIS — Z7189 Other specified counseling: Secondary | ICD-10-CM

## 2017-03-04 LAB — CBC WITH DIFFERENTIAL/PLATELET
BASOS ABS: 0.2 10*3/uL — AB (ref 0.0–0.1)
Basophils Relative: 1 %
EOS ABS: 0.2 10*3/uL (ref 0.0–0.7)
Eosinophils Relative: 1 %
HCT: 29.7 % — ABNORMAL LOW (ref 36.0–46.0)
Hemoglobin: 8.9 g/dL — ABNORMAL LOW (ref 12.0–15.0)
LYMPHS PCT: 6 %
Lymphs Abs: 1.5 10*3/uL (ref 0.7–4.0)
MCH: 28.8 pg (ref 26.0–34.0)
MCHC: 30 g/dL (ref 30.0–36.0)
MCV: 96.1 fL (ref 78.0–100.0)
Monocytes Absolute: 2.7 10*3/uL — ABNORMAL HIGH (ref 0.1–1.0)
Monocytes Relative: 11 %
NEUTROS ABS: 20 10*3/uL — AB (ref 1.7–7.7)
NEUTROS PCT: 81 %
PLATELETS: 705 10*3/uL — AB (ref 150–400)
RBC: 3.09 MIL/uL — AB (ref 3.87–5.11)
RDW: 16.8 % — ABNORMAL HIGH (ref 11.5–15.5)
WBC: 24.6 10*3/uL — AB (ref 4.0–10.5)

## 2017-03-04 LAB — GLUCOSE, CAPILLARY
GLUCOSE-CAPILLARY: 189 mg/dL — AB (ref 65–99)
GLUCOSE-CAPILLARY: 181 mg/dL — AB (ref 65–99)
Glucose-Capillary: 189 mg/dL — ABNORMAL HIGH (ref 65–99)
Glucose-Capillary: 194 mg/dL — ABNORMAL HIGH (ref 65–99)
Glucose-Capillary: 197 mg/dL — ABNORMAL HIGH (ref 65–99)
Glucose-Capillary: 200 mg/dL — ABNORMAL HIGH (ref 65–99)

## 2017-03-04 LAB — CULTURE, BLOOD (ROUTINE X 2)
CULTURE: NO GROWTH
Culture: NO GROWTH
SPECIAL REQUESTS: ADEQUATE
SPECIAL REQUESTS: ADEQUATE

## 2017-03-04 LAB — RENAL FUNCTION PANEL
ALBUMIN: 2.1 g/dL — AB (ref 3.5–5.0)
ANION GAP: 14 (ref 5–15)
BUN: 56 mg/dL — ABNORMAL HIGH (ref 6–20)
CALCIUM: 8.9 mg/dL (ref 8.9–10.3)
CO2: 27 mmol/L (ref 22–32)
Chloride: 92 mmol/L — ABNORMAL LOW (ref 101–111)
Creatinine, Ser: 3.52 mg/dL — ABNORMAL HIGH (ref 0.44–1.00)
GFR calc non Af Amer: 16 mL/min — ABNORMAL LOW (ref >60)
GFR, EST AFRICAN AMERICAN: 18 mL/min — AB (ref >60)
Glucose, Bld: 215 mg/dL — ABNORMAL HIGH (ref 65–99)
PHOSPHORUS: 2.8 mg/dL (ref 2.5–4.6)
Potassium: 3.4 mmol/L — ABNORMAL LOW (ref 3.5–5.1)
SODIUM: 133 mmol/L — AB (ref 135–145)

## 2017-03-04 LAB — C DIFFICILE QUICK SCREEN W PCR REFLEX
C Diff antigen: NEGATIVE
C Diff interpretation: NOT DETECTED
C Diff toxin: NEGATIVE

## 2017-03-04 MED ORDER — METOPROLOL TARTRATE 5 MG/5ML IV SOLN: 2.5000 mg | INTRAVENOUS | Status: DC | PRN

## 2017-03-04 MED ORDER — NEPRO/CARBSTEADY PO LIQD
1000.0000 mL | ORAL | Status: DC
  Administered 2017-03-04 – 2017-03-25 (×17): 1000 mL
  Filled 2017-03-04 (×18): qty 1000

## 2017-03-04 NOTE — Progress Notes (Signed)
Admit: 02/14/2017 LOS: 27  84F ESRD [place] admitted with nonhealing L leg wound and s/p cardiac arrest 02/14/17  Subjective:  HD yesterday, 3.5L UF,  Now on TRH service Cont to spike fevers, on ceftriaxone / flagyl / vancomycin Mother says contniues to engage, follow commands  06/05 0701 - 06/06 0700 In: 1310 [I.V.:240; NG/GT:620; IV Piggyback:450] Out: 3500   Filed Weights   03/02/17 0745 03/03/17 0450 03/04/17 0500  Weight: 104.1 kg (229 lb 8 oz) 98.9 kg (218 lb 0.6 oz) 96.7 kg (213 lb 3 oz)    Scheduled Meds: . calcium carbonate (dosed in mg elemental calcium)  2,000 mg of elemental calcium Oral TID  . chlorhexidine gluconate (MEDLINE KIT)  15 mL Mouth Rinse BID  . darbepoetin (ARANESP) injection - DIALYSIS  200 mcg Intravenous Q Tue-HD  . feeding supplement (NEPRO CARB STEADY)  1,000 mL Per Tube Q24H  . feeding supplement (PRO-STAT SUGAR FREE 64)  60 mL Per Tube QID  . [name]'s butt cream   Topical BID  . heparin  5,000 Units Subcutaneous Q8H  . insulin aspart  0-15 Units Subcutaneous Q4H  . mouth rinse  15 mL Mouth Rinse QID  . metoprolol succinate  12.5 mg Oral QHS  . multivitamin  1 tablet Oral QHS  . pantoprazole sodium  40 mg Per Tube Q24H   Continuous Infusions: . sodium chloride 10 mL/hr at 03/03/17 1700  . sodium chloride    . sodium chloride    . sodium chloride    . sodium chloride    . cefTRIAXone (ROCEPHIN)  IV Stopped (03/03/17 1344)  . metronidazole Stopped (03/04/17 0520)  . vancomycin Stopped (03/03/17 1036)   PRN Meds:.sodium chloride, sodium chloride, sodium chloride, sodium chloride, acetaminophen (TYLENOL) oral liquid 160 mg/5 mL, albuterol, alteplase, fentaNYL (SUBLIMAZE) injection, heparin, heparin, heparin, lidocaine (PF), lidocaine-prilocaine, pentafluoroprop-tetrafluoroeth  Current Labs: reviewed    Physical Exam:  Blood pressure (!) 142/94, pulse (!) 120, temperature 98.4 F (36.9 C), temperature source Oral, resp. rate (!) 28, height 5'  8.5" (1.74 m), weight 96.7 kg (213 lb 3 oz), SpO2 98 %. Awake, alert; tracks Follows commands; shows me 2 fingers with both hands Tachy, regular, nl s1s2 Coarse bs b/l Trach with ATC NGT in place Feet in boots AVF +B/T  Dialysis Orders: Center: [facility]  On TueThuSat, 4 hrs 15 min, Optiflux 200 EDW 114 (kg), 2.0 K, 2.0 Ca, AVF LUA  Iron Sucrose (Venofer) 50 mg IVP During Dialysis 1X Week  Mircera 75 mcg IV q  2 weeks ( last on 02/05/17) Vitamin D (Calcitriol) Oral 0.75 mcg  Po q hd   A 1.  ESRD THS [facility] 2. Cardiac Arrest 5/19 3. VDRF s/p trach, weaning 4. Nonhealing L foot wound, amputation recommended, ortho and [facility] have followied 5. HTN/Vol 6. DM2 7. NICM, hx/o S+D CHF 8. Anemia, max aranesp qTues; avoid Fe with infection 9. CKD BMD: P and Ca ok currently  P 1. HD tomorrow, cont on THS Schedue, start with 3K bath; 3L UF, SBP > 90, tight heparin   Pearson Grippe MD 03/04/2017, 9:05 AM   Recent Labs Lab 03/02/17 2357 03/03/17 0252 03/04/17 0430  NA 134* 136 133*  K 4.0 4.5 3.4*  CL 96* 95* 92*  CO2 _0 GLUCOSE 191* 157* 215*  BUN 42* 42* 56*  CREATININE 3.12* 3.11* 3.52*  CALCIUM 8.3* 8.4* 8.9  PHOS 2.6 2.7 2.8    Recent Labs Lab 03/02/17 0239 03/03/17 0252 03/04/17 0430  WBC 22.4* 21.7* 24.6*  NEUTROABS  --   --  20.0*  HGB 8.5* 8.5* 8.9*  HCT 28.2* 28.3* 29.7*  MCV 96.2 97.6 96.1  PLT 732* 672* 705*

## 2017-03-04 NOTE — Consult Note (Signed)
Consultation Note Date: 03/04/2017   Patient Name: [name]  DOB: 24-May-1981  MRN: [id number]  Age / Sex: 36 y.o., female  PCP: Vicenta Aly, [place] Referring Physician: Rosita Fire, MD  Reason for Consultation: Establishing goals of care and Psychosocial/spiritual support  HPI/Patient Profile: 36 y.o. female admitted on 02/14/2017  w/ ESRD,  poorly controlled DM, bilateral transmetatarsal foot amputations, obesity, HTN.   Admitted 5/19 with complaints of left leg pain, found to have radiological evidence of osteomyelitis with wound nonhealing, dehiscence, purulent drainage from the wound. Admitted for IV antibiotics, hemodialysis and possible surgery.  On May 19 th she was  found unresponsive in the room. She had chest compressions,  was intubated by anesthesia and had ROSC in approximately 9 minutes. Downtime prior to code is unknown.   Currently she has a trach, is requiring ventilator for support, is weaning and following some simple commands intermittently.  Continues with HD.     Outstanding questions remains regarding recommended amputation; family is asking for a second opinion   Family face treatment option decisions, advanced directive decisions, and anticipatory care needs  PMT will continue to support holistically   PAST MEDICAL HISTORY : per EMR She  has a past medical history of Anemia; Arthritis; Asthma; CHF (congestive heart failure) ([place]); Chronic bronchitis ([place]); Chronic kidney disease; COPD (chronic obstructive pulmonary disease) ([place]); Coronary artery disease; Hyperlipidemia; Hypertension; Migraine; Myocardial infarction ([place]) (04/2015); Normal coronary arteries; Peripheral vascular disease ([place]); Pneumonia; PONV (postoperative nausea and vomiting); Restless legs; Shortness of breath; and Type 1 diabetes mellitus ([place]).  PAST SURGICAL HISTORY: per EMR She  has a past  surgical history that includes Cesarean section (1999; 2006); Finger surgery (Left, 1985); Tonsillectomy (1997); Tubal ligation (2006); coronary angiogram (09/20/2013); AV fistula placement (Left, 03/27/2015); Shoulder arthroscopy with bicepstenotomy (Right, 05/10/2015); AV fistula placement (Left, 11/23/2015); Amputation (Right, 06/25/2016); Cardiac catheterization (N/A, 09/05/2016); Cardiac catheterization (Bilateral, 09/05/2016); Cardiac catheterization (Right, 09/05/2016); Amputation (Bilateral, 10/08/2016); and Stump revision (Left, 01/21/2017).   Clinical Assessment and Goals of Care:  This NP Wadie Lessen reviewed medical records, received report from team, assessed the patient and then meet at the patient's bedside along with her mother  to introduce the role of palliative medicine in a holistic treatment plan.  Values and goals of care important to patient and family were attempted to be elicited.  Patient's mother remain hopeful for improvement, she verbalizes confusion regarding recommendation for  further amputation.  She is requesting to have another surgeon evaluate her daughter and make recommendations.  She verbalizes to me that if amputation is necessary to  "save her life than that is what I will have to do"   Questions and concerns addressed.   Family encouraged to call with questions or concerns.  PMT will continue to support holistically.   NEXT OF KIN/ mother is main decsion maker at this time, patient does not have capacity to make her own decsions    SUMMARY OF RECOMMENDATIONS    Code Status/Advance Care Planning:  Full code  Palliative Prophylaxis:   Frequent Pain Assessment and Oral Care  Additional Recommendations (Limitations, Scope, Preferences):  Full Scope Treatment  Psycho-social/Spiritual:    Created space and opportunity for patient's mother to share her thoughts and feelings regarding her daughter's medical situation.  Emotional support  offered  Prognosis:   Unable to determine  Discharge Planning: Discussed with mother the likelihood that it will be necessary for SNF placement when patient is ready for discharge, she verbalizes an understanding of the nursing needs   To Be Determined      Primary Diagnoses: Present on Admission: . Osteomyelitis ([place]) . Uncontrolled type 2 diabetes with renal manifestation ([place]) . Hyperlipidemia . Hypertension . Morbid obesity-  . Normocytic anemia . 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 Diabetic osteomyelitis ([place]) . Elevated liver enzymes . Acute combined systolic and diastolic heart failure ([place]) . Chronic pain syndrome . Dehiscence of amputation stump ([place])   I have reviewed the medical record, interviewed the patient and family, and examined the patient. The following aspects are pertinent.  Past Medical History:  Diagnosis Date  . Anemia   . Arthritis    "left hand" (09/15/2013)  . Asthma   . CHF (congestive heart failure) ([place])   . Chronic bronchitis ([place])    "just about q yr" (09/15/2013)  . Chronic kidney disease    "low kidney function" (09/15/2013) ,  T/Th/Sa  . COPD (chronic obstructive pulmonary disease) ([place])   . Coronary artery disease   . Hyperlipidemia   . Hypertension   . Migraine    "get them alot" (09/15/2013)  . Myocardial infarction [facility]) 04/2015   NSTEMI  . Normal coronary arteries    by cardiac catheterization 09/20/13  . Peripheral vascular disease ([place])   . Pneumonia    "couple times; have it now" (09/15/2013)  . PONV (postoperative nausea and vomiting)   . Restless legs   . Shortness of breath    "just recently; related to the pneumonia" (09/15/2013)  . Type 1 diabetes mellitus ([place])    type 2   Social History   Social History  . Marital status: Single    Spouse name: N/A  . Number of children: 2  . Years of education: N/A   Occupational History  . Student    Social History Main Topics  . Smoking status:  Never Smoker  . Smokeless tobacco: Never Used  . Alcohol use No  . Drug use: No  . Sexual activity: Not Currently    Partners: Male    Birth control/ protection: Other-see comments     Comment: S/P tubal ligation   Other Topics Concern  . None   Social History Narrative   Single.  Moved here from [place].  Lives with 2 children.     Family History  Problem Relation Age of Onset  . Diabetes Mother   . Stroke Mother   . Hypertension Father   . Hyperlipidemia Father   . Cancer - Lung Father   . Diabetes Maternal Grandmother   . Cancer Paternal Grandmother    Scheduled Meds: . calcium carbonate (dosed in mg elemental calcium)  2,000 mg of elemental calcium Oral TID  . chlorhexidine gluconate (MEDLINE KIT)  15 mL Mouth Rinse BID  . darbepoetin (ARANESP) injection - DIALYSIS  200 mcg Intravenous Q Tue-HD  . feeding supplement (NEPRO CARB STEADY)  1,000 mL Per Tube Q24H  . feeding supplement (PRO-STAT SUGAR FREE 64)  60 mL Per Tube QID  . [name]'s  butt cream   Topical BID  . heparin  5,000 Units Subcutaneous Q8H  . insulin aspart  0-15 Units Subcutaneous Q4H  . mouth rinse  15 mL Mouth Rinse QID  . metoprolol succinate  12.5 mg Oral QHS  . multivitamin  1 tablet Oral QHS  . pantoprazole sodium  40 mg Per Tube Q24H   Continuous Infusions: . sodium chloride 10 mL/hr at 03/03/17 1700  . sodium chloride    . sodium chloride    . sodium chloride    . sodium chloride    . cefTRIAXone (ROCEPHIN)  IV Stopped (03/04/17 1130)  . metronidazole Stopped (03/04/17 1227)  . vancomycin Stopped (03/03/17 1036)   PRN Meds:.sodium chloride, sodium chloride, sodium chloride, sodium chloride, acetaminophen (TYLENOL) oral liquid 160 mg/5 mL, albuterol, alteplase, fentaNYL (SUBLIMAZE) injection, heparin, heparin, heparin, lidocaine (PF), lidocaine-prilocaine, pentafluoroprop-tetrafluoroeth Medications Prior to Admission:  Prior to Admission medications   Medication Sig Start Date End Date  Taking? Authorizing Provider  albuterol (PROVENTIL HFA;VENTOLIN HFA) 108 (90 BASE) MCG/ACT inhaler Inhale 2 puffs into the lungs every 6 (six) hours as needed for wheezing or shortness of breath.    Yes [provider]  albuterol (PROVENTIL) (2.5 MG/3ML) 0.083% nebulizer solution Take 2.5 mg by nebulization every 6 (six) hours as needed for wheezing or shortness of breath.   Yes [provider]  atorvastatin (LIPITOR) 40 MG tablet Take 40 mg by mouth at bedtime.   Yes [provider]  budesonide (PULMICORT) 0.25 MG/2ML nebulizer solution Take 0.25 mg by nebulization 2 (two) times daily as needed (shortness of breath or wheezing).   Yes [provider]  carvedilol (COREG) 25 MG tablet Take 25 mg by mouth 2 (two) times daily.   Yes [provider]  collagenase (SANTYL) ointment Apply 1 application topically daily. Patient taking differently: Apply 1 application topically daily as needed (itching).  11/21/16  Yes Suzan Slick, NP  diphenhydrAMINE (BENADRYL) 25 MG tablet Take 25 mg by mouth every 6 (six) hours as needed for itching.   Yes [provider]  DULoxetine (CYMBALTA) 30 MG capsule Take 1 capsule (30 mg total) by mouth daily. Qam 07/21/16  Yes Newt Minion, MD  hydrOXYzine (ATARAX/VISTARIL) 25 MG tablet Take 1 tablet (25 mg total) by mouth every 6 (six) hours. 03/30/16  Yes Tanna Furry, MD  insulin aspart (NOVOLOG FLEXPEN) 100 UNIT/ML FlexPen Inject 2-15 Units into the skin 3 (three) times daily with meals. Per sliding scale - based on carb count and CBG    Yes [provider]  Insulin Glargine (LANTUS SOLOSTAR) 100 UNIT/ML Solostar Pen Inject 10 Units into the skin at bedtime. Patient taking differently: Inject 15 Units into the skin at bedtime.  06/26/16  Yes Reyne Dumas, MD  isosorbide mononitrate (ISMO,MONOKET) 20 MG tablet Take 20 mg by mouth 3 (three) times daily.   Yes [provider]  meclizine (ANTIVERT) 25 MG  tablet Take 25 mg by mouth 3 (three) times daily as needed for dizziness or nausea.   Yes [provider]  multivitamin (RENA-VIT) TABS tablet Take 1 tablet by mouth daily.   Yes [provider]  oxyCODONE (ROXICODONE) 5 MG immediate release tablet Take 1 tablet (5 mg total) by mouth 2 (two) times daily as needed for breakthrough pain. 12/24/16  Yes Suzan Slick, NP  oxyCODONE-acetaminophen (ROXICET) 5-325 MG tablet Take 1 tablet by mouth every 4 (four) hours as needed for severe pain. 01/21/17  Yes Newt Minion,  MD  pregabalin (LYRICA) 50 MG capsule Take 1 capsule (50 mg total) by mouth 3 (three) times daily. 07/21/16  Yes Suzan Slick, NP  sevelamer carbonate (RENVELA) 800 MG tablet Take 3,200-4,000 mg by mouth 3 (three) times daily with meals.    Yes [provider]  tiZANidine (ZANAFLEX) 4 MG tablet Take 4 mg by mouth 3 (three) times daily as needed for muscle spasms.   Yes [provider]   Allergies  Allergen Reactions  . Aspirin Anaphylaxis  . Sulfur Hives  . Tramadol Hives and Other (See Comments)    Pt states she feels weird   . Gabapentin Other (See Comments)    Lethargic    Review of Systems  Unable to perform ROS: Acuity of condition    Physical Exam  Constitutional: She appears ill.  Neck:  Trach noted with trach collar, site is CDI  Pulmonary/Chest: She has decreased breath sounds.  Neurological: She is alert.  Skin: Skin is warm and dry.    Vital Signs: BP 119/73 (BP Location: Right Arm)   Pulse (!) 117   Temp 98.7 F (37.1 C) (Oral)   Resp (!) 24   Ht 5' 8.5" (1.74 m)   Wt 96.7 kg (213 lb 3 oz)   LMP  (LMP Unknown) Comment: Patient trached  SpO2 100%   BMI 31.94 kg/m  Pain Assessment: No/denies pain   Pain Score: 0-No pain   SpO2: SpO2: 100 % O2 Device:SpO2: 100 % O2 Flow Rate: .O2 Flow Rate (L/min): 8 L/min  IO: Intake/output summary:  Intake/Output Summary (Last 24 hours) at 03/04/17 1307 Last data filed at  03/04/17 1200  Gross per 24 hour  Intake          1353.75 ml  Output                0 ml  Net          1353.75 ml    LBM: Last BM Date: 03/03/17 Baseline Weight: Weight: 108 kg (238 lb) Most recent weight: Weight: 96.7 kg (213 lb 3 oz)       Discussed with Dr Carolin Sicks and Junie Panning PA-C with Dr Jess Barters office, I am told patient will be seen today by another surgeon for a second opinion.   Time In: 1015 Time Out: 1130 Time Total: 75 min Greater than 50%  of this time was spent counseling and coordinating care related to the above assessment and plan.  Signed by: Wadie Lessen, NP   Please contact Palliative Medicine Team phone at [phone] for questions and concerns.  For individual provider: See Shea Evans

## 2017-03-04 NOTE — Progress Notes (Signed)
   HR 135 sinus  Plan Change po to IV lopressor prn  Dr. Kalman Shan, M.D., [facility].C.P Pulmonary and Critical Care Medicine Staff Physician [facility] [place] [facility] Pager: [phone], If no answer or between  15:00h - 7:00h: call [phone]  03/04/2017 11:25 PM

## 2017-03-04 NOTE — Consult Note (Signed)
The patient is a 36 year old female who is currently in the intensive care unit with multiple medical issues. I was asked to see her as a second opinion to evaluate her left lower extremity and the possibility of the need for an amputation. This is certainly a complicated situation. The patient's mother who is at the bedside has made the request for second opinion as it relates to her daughter's left leg. Currently, the patient has recently undergone left foot surgery which included what appears to be a midfoot amputation. She has been unable to heal her wound from the surgery and at this point has exposed bone. Dr. [name] who has seen her before both as an outpatient and in the hospital as recommended at the minimum a below-knee amputation. According to the notes the the patient has been adamant that she did not want such a surgery. She then unfortunately had a significant decline in her medical status is now on intensive care unit and has a tracheostomy. Palliative Care has also been consult it.  On examination of her left lower extremity there is a large area of eschar where the midfoot amputation occurred and there is exposed bone and necrotic tissue. There is no drainage that is significant at all and no gross purulence. There are a small amount of superficial wounds on the leg near the knee. I actually showed the patient's mother what the end of the patient's foot looks like tissue could see the extent of what is involved with the patient's foot. At this point, I would recommend at the minimum a below-knee amputation but I told her mother she could still and up with an above-knee amputation. This all depends on the quality of the tissue and her healing potential.  After speaking with her mother in length, she would like need to come back to the bedside this Saturday when other family would be available to discuss this further. I certainly can do this however I will be out of town all next week and will be  unable to perform any surgery and would have to potentially passed the soft tissue even at third individual if an amputation is decided upon. Again it is been against the patient's wishes to have an amputation, but it sounds like the family may be reconsidering based on the patient's current medical status. I did reiterate that having an amputation at this point can still not guarantee full recovery for the patient given her current severe health situation.

## 2017-03-04 NOTE — Progress Notes (Signed)
Dr. David Stall and I went to [facility] today at 12 noon to discuss with the patient's mother Ms. [place] that in reviewing the code blue event on May 19, the patient was not placed on the cardiac monitor as ordered prior to the event. Dr. Robb Matar spoke to the medical concerns of the patient. Dr. Robb Matar and I asked the mother if she had any concerns / questions and she stated no and that her daughter was improving with a long road ahead. I gave the mom my card and instructed her to call me if she had any concerns or questions and she verbalized understanding. Ferne Reus, Department Director-[facility]

## 2017-03-04 NOTE — Progress Notes (Signed)
Nutrition Follow-up  DOCUMENTATION CODES:   Obesity unspecified  INTERVENTION:   Decrease Nepro to goal rate of 15 ml/h (360 ml per day)  Continue 60 ml Prostat QID   TF regimen to provide 1464 kcals, 128 gm protein, 349 ml free water daily   NUTRITION DIAGNOSIS:   Inadequate oral intake related to inability to eat as evidenced by NPO status. Ongoing.   GOAL:   Provide needs based on ASPEN/SCCM guidelines Met.   MONITOR:   Vent status, TF tolerance, Labs, Weight trends, Skin, I & O's  ASSESSMENT:   36 yo Female w/ ESRD d/t poorly controlled DM, bilateral transmetatarsal foot amputations, obesity, HTN, medical non-compliance as well as NICM. Admitted with complaints of left leg pain, found to have radiological evidence of osteomyelitis with wound nonhealing, dehiscence, purulent drainage from the wound. Admitted for IV antibiotics, hemodialysis and possible surgery. Pt with nonhealing L foot wound, amputation recommended. 5/19 cardiac arrest.   5/31 trach placed, pt on vent support but has trials of TC during the day 6/1 Cortrak placed  Pt now following commands per nephrology.  Weight is decreased by 25 lb, pt is negative 13 L - adjusted needs.  Current TF: Nepro @ 20 ml/hr with 60 ml Prostat QID= 1664 kcal and 159 grams protein  Medications reviewed and include: calcium carbonate suspension, rena-vit  Labs reviewed: K+ 3.4, PO4 2.8 CBG's: [phone]   Diet Order:    NPO  Skin:  Left heel DM foot ulcer unstageable to lower heel, DTI to upper heel, MASD groin and sacrum Stage II anus  Last BM:  6/5 small   Height:   Ht Readings from Last 1 Encounters:  02/14/17 5' 8.5" (1.74 m)    Weight:   Wt Readings from Last 1 Encounters:  03/04/17 213 lb 3 oz (96.7 kg)    Ideal Body Weight:  63.6 kg  BMI:  Body mass index is 31.94 kg/m.  Estimated Nutritional Needs:   Kcal:  1100-1400  Protein:  >/= 127 grams  Fluid:  per MD  EDUCATION NEEDS:   No  education needs identified at this time  [place], [place], [place] Pager [phone] After Hours Pager

## 2017-03-04 NOTE — Progress Notes (Addendum)
PROGRESS NOTE    [name]  EXM:[id number] DOB: [birth year] DOA: 02/14/2017 PCP: Vicenta Aly, FNP   Brief Narrative: 36 year old female end-stage renal disease on hemodialysis secondary to poorly controlled diabetes mellitus, bilateral transmetatarsal foot amputations, obesity, hypertension, medical noncompliance, nonischemic cardiomyopathy who was admitted 02/14/2017 with left leg pain and found to have osteomyelitis with wound nonhealing, dehiscence, purulent drainage from the wound. Patient placed empirically on IV antibiotics, orthopedics and nephrology consulted. Patient was found unresponsive in the room on 02/14/2017 after getting IV Dilaudid in the afternoon for leg pain. Patient had a 9+ minute asystole cardiac arrest was intubated and transferred to the ICU and underwent cooling and rewarming. Patient with no significant neurological improvement and was assessed by neurology who felt that given patient's current neurological status the meaningful neurological recovery was very dismal.  Patient also seen in consultation by ID and placed empirically on IV antibiotics which have been D escalated to IV Rocephin, Flagyl and vancomycin. Patient has been followed by nephrology and receiving hemodialysis during the hospitalization. Orthopedics consulted and patient seen by Dr. Sharol Given and is noted that patient has a dry gangrene involving the heel and Achilles of her left foot. It is noted that patient has a dry gangrene S with exposed bone. Recommendations from orthopedics was to proceed with a transtibial amputation which has been discussed multiple times in the office prior to admission. As per, orthopedics patient does not want to proceed with transtibial amputation and mother reinforcing patient's wishes at this time. Patient currently on empiric IV antibiotics. Today (6/62018) is first day for me with the patient.  Assessment & Plan:  # acute respiratory failure/vent dependent -Status post  ATC.  -Continue trach care protocol. 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  -Continue Toprol-XL.  # end-stage renal disease on hemodialysis -Hemodialysis as per nephrologist. Monitor labs and blood pressure.  # moderate protein calorie malnutrition -Patient currently getting tube feeds. IR was consulted for PEG tube placement however due to patient's leukocytosis and intermittent fevers IR holding off on PEG tube secondary to concern for seeding. -Follow up on INR and palliative care.  # anemia of chronic disease and chronic kidney disease: Monitor CBC. No sign of bleeding.  # left foot osteomyelitis/gangrene  -Patient has been seen in consultation by orthopedics, Dr. Sharol Given who has recommended amputation however patient refusing amputation.  -Patient currently empirically on IV Rocephin, Flagyl and vancomycin with recommendations from ID to continue for a total of 10 days. Last dose 03/07/2017. -TEE negative for endocarditis.  -Follow-up orthopedics for further plan. Discussed with the palliative care team today who will discuss with Dr. Sharol Given today. ? Second ortho opinion as per family's request.  # acute metabolic encephalopathy/anoxic encephalopathy post cardiac arrest -opens eyes only. As per patient's mother mental status is improving.  # diabetes mellitus type 2 with neuropathy -Continue sliding scale. Monitor blood sugar level.  # leukocytosis and intermittent fever:  due to dry gangrene. Evaluated by infectious disease. Recommended no long-term antibiotics since the treatment his only surgical amputation.  #Goals of care: Discussion ongoing with palliative care team.  Patient has poor prognosis.  # Diarrhea: check stool c diff. The c diff checked on 6/1 was negative.  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    Consultants:   Infectious disease: Dr. Graylon Good 02/24/2017  Neurology Dr.Lindzen 02/24/2017  Gastroenterology: Dr. Watt Climes 02/19/2017  Wound care  PCCM: Dr Vaughan Browner 02/14/2017  Nephrology: Dr.Schertz 02/14/2017  Orthopedics: Dr. Durward Fortes 02/14/2017  Orthopedics: Dr. Sharol Given  Procedures:  CT head 02/14/2017  CT abdomen and pelvis 03/01/2017  Right upper quadrant ultrasound 02/18/2017  TEE 03/02/2017  MRI brain 02/18/2017  Lower extremity Dopplers 02/13/2017 negative for DVT  EEG 02/15/2017>>>> findings consistent with moderate to severe global cerebral dysfunction. Consistent with anoxic injury given clinical course Antimicrobials: Clinidamycin 5/19 >> 5/21 Vancomycin 5/19 >> 5/25 >> restart 5/30.Marland Kitchen Zosyn 5/19 >> 5/22 Meropenem 5/22 >> 5/30 Flagyl 5/30 >> Ceftriaxone 5/30 >> Subjective: Seen and examined at bedside. Patient only  opening her eyes. Unable to obtain review of system. Mother at bedside.  Objective: Vitals:   03/04/17 0823 03/04/17 0830 03/04/17 0900 03/04/17 1000  BP:  (!) 142/94 (!) 138/91 (!) 141/100  Pulse:  (!) 120 (!) 117 (!) 119  Resp:  (!) 28 17 (!) 9  Temp: 98.4 F (36.9 C)     TempSrc: Oral     SpO2:  98% 98% 97%  Weight:      Height:        Intake/Output Summary (Last 24 hours) at 03/04/17 1114 Last data filed at 03/04/17 1000  Gross per 24 hour  Intake             1280 ml  Output                0 ml  Net             1280 ml   Filed Weights   03/02/17 0745 03/03/17 0450 03/04/17 0500  Weight: 104.1 kg (229 lb 8 oz) 98.9 kg (218 lb 0.6 oz) 96.7 kg (213 lb 3 oz)    Examination:  General exam: Ill-looking female with a tracheostomy and nasal feeding tube, not in distress Neck: Tracheostomy site looks clean Respiratory system: Clear bilaterally, no wheezing Cardiovascular system: S1 & S2 heard, RRR.  No pedal edema. Gastrointestinal system: Abdomen is nondistended, soft and nontender. Normal bowel sounds heard. Central nervous system: opens her eyes, and not following commands and oriented Extremities: Unable to assess. Skin: No rashes, lesions or ulcers Psychiatry: Unable to assess.     Data Reviewed: I have personally reviewed following labs and imaging studies  CBC:  Recent Labs Lab 02/27/17 0349 02/28/17 0309 03/02/17 0239 03/03/17 0252 03/04/17 0430  WBC 21.1* 24.1* 22.4* 21.7* 24.6*  NEUTROABS  --   --   --   --  20.0*  HGB 8.3* 9.1* 8.5* 8.5* 8.9*  HCT 27.6* 29.9* 28.2* 28.3* 29.7*  MCV 96.2 96.5 96.2 97.6 96.1  PLT 593* 614* 732* 672* 423*   Basic Metabolic Panel:  Recent Labs Lab 03/01/17 0956 03/02/17 0239 03/02/17 2357 03/03/17 0252 03/04/17 0430  NA 136 137 134* 136 133*  K 3.7 3.6 4.0 4.5 3.4*  CL 98* 98* 96* 95* 92*  CO2 25 26 26 23 27   GLUCOSE 179* 155* 191* 157* 215*  BUN 45* 67* 42* 42* 56*  CREATININE 3.61* 4.57* 3.12* 3.11* 3.52*   CALCIUM 8.5* 8.6* 8.3* 8.4* 8.9  MG  --   --  1.9 1.9  --   PHOS 2.8 3.5 2.6 2.7 2.8   GFR: Estimated Creatinine Clearance: 26.8 mL/min (A) (by C-G formula based on SCr of 3.52 mg/dL (H)). Liver Function Tests:  Recent Labs Lab 02/26/17 0515 03/01/17 0956 03/02/17 0239 03/03/17 0252 03/04/17 0430  AST 25  --   --   --   --   ALT 12*  --   --   --   --   ALKPHOS 558*  --   --   --   --   BILITOT 3.5*  --   --   --   --   PROT 5.7*  --   --   --   --   ALBUMIN 1.7* 2.0* 1.9* 2.0* 2.1*   No results for input(s): LIPASE, AMYLASE in the last 168 hours. No results for input(s): AMMONIA in the last 168 hours. Coagulation Profile: No results for input(s): INR, PROTIME in the last 168 hours. Cardiac Enzymes: No results for input(s): CKTOTAL, CKMB, CKMBINDEX, TROPONINI in the last 168 hours. BNP (last 3 results) No results for input(s): PROBNP in the last 8760 hours. HbA1C: No results for input(s): HGBA1C in the last 72 hours. CBG:  Recent Labs Lab 03/03/17 1536 03/03/17 2012 03/03/17 2356 03/04/17 0432 03/04/17 0822  GLUCAP 134* 181* 198* 189* 197*   Lipid Profile: No results for input(s): CHOL, HDL, LDLCALC, TRIG, CHOLHDL, LDLDIRECT in the last 72 hours. Thyroid Function Tests: No results for input(s): TSH, T4TOTAL, FREET4, T3FREE, THYROIDAB in the last 72 hours. Anemia Panel: No results for input(s): VITAMINB12, FOLATE, FERRITIN, TIBC, IRON, RETICCTPCT in the last 72 hours. Sepsis Labs: No results for input(s): PROCALCITON, LATICACIDVEN in the last 168 hours.  Recent Results (from the past 240 hour(s))  C difficile quick scan w PCR reflex     Status: None   Collection Time: 02/27/17  9:41 AM  Result Value Ref Range Status   C Diff antigen NEGATIVE NEGATIVE Final   C Diff toxin NEGATIVE NEGATIVE Final   C Diff interpretation No C. difficile detected.  Final  Culture, blood (Routine X 2) w Reflex to ID Panel     Status: None (Preliminary result)   Collection Time:  02/27/17 10:39 AM  Result Value Ref Range Status   Specimen Description BLOOD RIGHT HAND  Final   Special Requests IN PEDIATRIC BOTTLE Blood Culture adequate volume  Final   Culture NO GROWTH 4 DAYS  Final   Report Status PENDING  Incomplete  Culture, blood (Routine X 2) w Reflex to ID Panel     Status: None (Preliminary result)   Collection Time: 02/27/17 10:49 AM  Result Value Ref Range Status   Specimen Description BLOOD RIGHT HAND  Final   Special Requests IN PEDIATRIC BOTTLE Blood Culture adequate volume  Final   Culture NO GROWTH  4 DAYS  Final   Report Status PENDING  Incomplete         Radiology Studies: Dg Chest Port 1 View  Result Date: 03/03/2017 CLINICAL DATA:  Respiratory failure. EXAM: PORTABLE CHEST 1 VIEW COMPARISON:  03/01/2017 . FINDINGS: Tracheostomy tube and feeding tube in stable position. Cardiomegaly with mild diffuse interstitial prominence suggesting mild CHF. Persistent atelectasis and consolidation left lower lobe. Small left pleural effusion cannot be excluded. No pneumothorax. IMPRESSION: 1. Tracheostomy tube and feeding tube in stable position. 2. Cardiomegaly with diffuse mild interstitial prominence suggesting mild CHF. 3. Persistent left lower lobe atelectasis and consolidation. Small left pleural effusion cannot be excluded . Electronically Signed   By: Marcello Moores  Register   On: 03/03/2017 07:09   Dg Abd Portable 1v  Result Date: 03/02/2017 CLINICAL DATA:  Encounter for feeding tube placement EXAM: PORTABLE ABDOMEN - 1 VIEW COMPARISON:  Portable exam 1712 hours compared to CT abdomen and pelvis of 03/01/2017 FINDINGS: Feeding tube traverses abdomen with tip projecting over distal antrum near pylorus. Cardiac silhouette appears enlarged. Visualized bowel gas pattern normal. Osseous structures unremarkable. IMPRESSION: Tip of feeding tube projects over distal gastric antrum near pylorus. Electronically Signed   By: Lavonia Dana M.D.   On: 03/02/2017 17:21         Scheduled Meds: . calcium carbonate (dosed in mg elemental calcium)  2,000 mg of elemental calcium Oral TID  . chlorhexidine gluconate (MEDLINE KIT)  15 mL Mouth Rinse BID  . darbepoetin (ARANESP) injection - DIALYSIS  200 mcg Intravenous Q Tue-HD  . feeding supplement (NEPRO CARB STEADY)  1,000 mL Per Tube Q24H  . feeding supplement (PRO-STAT SUGAR FREE 64)  60 mL Per Tube QID  . [name]'s butt cream   Topical BID  . heparin  5,000 Units Subcutaneous Q8H  . insulin aspart  0-15 Units Subcutaneous Q4H  . mouth rinse  15 mL Mouth Rinse QID  . metoprolol succinate  12.5 mg Oral QHS  . multivitamin  1 tablet Oral QHS  . pantoprazole sodium  40 mg Per Tube Q24H   Continuous Infusions: . sodium chloride 10 mL/hr at 03/03/17 1700  . sodium chloride    . sodium chloride    . sodium chloride    . sodium chloride    . cefTRIAXone (ROCEPHIN)  IV 2 g (03/04/17 1100)  . metronidazole Stopped (03/04/17 0520)  . vancomycin Stopped (03/03/17 1036)     LOS: 18 days    Dron Tanna Furry, MD [facility] Pager [phone]  If 7PM-7AM, please contact night-coverage [email or web address] Password [email or web address] 03/04/2017, 11:14 AM

## 2017-03-05 DIAGNOSIS — R000 Tachycardia, unspecified: Secondary | ICD-10-CM

## 2017-03-05 LAB — RENAL FUNCTION PANEL
Albumin: 2.1 g/dL — ABNORMAL LOW (ref 3.5–5.0)
Anion gap: 18 — ABNORMAL HIGH (ref 5–15)
BUN: 91 mg/dL — ABNORMAL HIGH (ref 6–20)
CALCIUM: 9.4 mg/dL (ref 8.9–10.3)
CO2: 25 mmol/L (ref 22–32)
CREATININE: 4.75 mg/dL — AB (ref 0.44–1.00)
Chloride: 95 mmol/L — ABNORMAL LOW (ref 101–111)
GFR, EST AFRICAN AMERICAN: 13 mL/min — AB (ref >60)
GFR, EST NON AFRICAN AMERICAN: 11 mL/min — AB (ref >60)
Glucose, Bld: 173 mg/dL — ABNORMAL HIGH (ref 65–99)
PHOSPHORUS: 3.3 mg/dL (ref 2.5–4.6)
Potassium: 3.5 mmol/L (ref 3.5–5.1)
SODIUM: 138 mmol/L (ref 135–145)

## 2017-03-05 LAB — GLUCOSE, CAPILLARY
GLUCOSE-CAPILLARY: 173 mg/dL — AB (ref 65–99)
GLUCOSE-CAPILLARY: 173 mg/dL — AB (ref 65–99)
GLUCOSE-CAPILLARY: 160 mg/dL — AB (ref 65–99)
Glucose-Capillary: 142 mg/dL — ABNORMAL HIGH (ref 65–99)
Glucose-Capillary: 181 mg/dL — ABNORMAL HIGH (ref 65–99)

## 2017-03-05 LAB — CBC
HCT: 30.2 % — ABNORMAL LOW (ref 36.0–46.0)
HEMOGLOBIN: 9.1 g/dL — AB (ref 12.0–15.0)
MCH: 29.3 pg (ref 26.0–34.0)
MCHC: 30.1 g/dL (ref 30.0–36.0)
MCV: 97.1 fL (ref 78.0–100.0)
PLATELETS: 693 10*3/uL — AB (ref 150–400)
RBC: 3.11 MIL/uL — AB (ref 3.87–5.11)
RDW: 17.2 % — ABNORMAL HIGH (ref 11.5–15.5)
WBC: 26.7 10*3/uL — AB (ref 4.0–10.5)

## 2017-03-05 MED ORDER — ALBUMIN HUMAN 25 % IV SOLN
25.0000 g | Freq: Once | INTRAVENOUS | Status: AC
  Administered 2017-03-05: 25 g via INTRAVENOUS

## 2017-03-05 MED ORDER — VANCOMYCIN HCL IN DEXTROSE 1-5 GM/200ML-% IV SOLN
INTRAVENOUS | Status: AC
  Administered 2017-03-05: 1000 mg via INTRAVENOUS
  Filled 2017-03-05: qty 200

## 2017-03-05 MED ORDER — ALBUMIN HUMAN 25 % IV SOLN
INTRAVENOUS | Status: AC
  Administered 2017-03-05: 25 g via INTRAVENOUS
  Filled 2017-03-05: qty 100

## 2017-03-05 MED ORDER — SODIUM CHLORIDE 0.9 % IV SOLN: 100.0000 mL | INTRAVENOUS | Status: DC | PRN

## 2017-03-05 MED ORDER — HEPARIN SODIUM (PORCINE) 1000 UNIT/ML DIALYSIS
20.0000 [IU]/kg | INTRAMUSCULAR | Status: DC | PRN
  Administered 2017-03-05: 1900 [IU] via INTRAVENOUS_CENTRAL
  Filled 2017-03-05 (×2): qty 2

## 2017-03-05 MED ORDER — SODIUM CHLORIDE 0.9 % IV SOLN
INTRAVENOUS | Status: DC
  Administered 2017-03-07: 04:00:00 via INTRAVENOUS

## 2017-03-05 NOTE — Progress Notes (Signed)
Patient ID: [name], female   DOB: 11-Nov-1980, 36 y.o.   MRN: [id number]  This NP visited patient and her mother at the bedside as a follow up to  yesterday's GOCs meeting.  Although patient is intermittently following simple commands she is unable at this time to make medical decisions for her self.  Her mother [name] is her main support person.  Mother expresses appreciation for Dr Eliberto Ivory visit and input.   Plan is for another meeting with orthopedics on Saturday with other family members to be present,  Goal is to facilitate  clear and concise delivery of healthcare information, helping family to make responsible medical decisions for the patient in this complex medical situation.  Discussed with patient the importance of continued conversation with family and their  medical providers regarding overall plan of care and treatment options,  ensuring decisions are within the context of the patients values and GOCs.  Mother of the patient verbalizes that even though her daughter had expressed no desire for amputation in the past,  she believes that if she understood that without surgery it would hastens death, she would move forward with surgery.  Questions and concerns addressed.  PMT will continue to support holistically  Time in  0745      Time out    0830  Total time spent on the unit was 45 min  Greater than 50% of the time was spent in counseling and coordination of care  [name] NP  Palliative Medicine Team Team Phone # [phone] Pager [phone]

## 2017-03-05 NOTE — Progress Notes (Signed)
CSW continuing to follow for possible vent SNF placement  Very limited options if pt is requiring vent and/or trach at DC due to patient requiring dialysis and only payor source is Medicaid.  Burna Sis, LCSW Clinical Social Worker [phone]

## 2017-03-05 NOTE — Progress Notes (Signed)
Transferred the patient to 3M06 in bed on trach vent, after giving report to Redelda,RN. Hd aware, no concerns.  [name]

## 2017-03-05 NOTE — Progress Notes (Signed)
HD tx initiated via 15G x2 w/o problem, pull/push/flush well, will cont to monitor while on HD tx 

## 2017-03-05 NOTE — Progress Notes (Signed)
PROGRESS NOTE    [name]  MPN:[id number] DOB: Nov 12, 1980 DOA: 02/14/2017 PCP: Vicenta Aly, FNP   Brief Narrative: 36 year old female end-stage renal disease on hemodialysis secondary to poorly controlled diabetes mellitus, bilateral transmetatarsal foot amputations, obesity, hypertension, medical noncompliance, nonischemic cardiomyopathy who was admitted 02/14/2017 with left leg pain and found to have osteomyelitis with wound nonhealing, dehiscence, purulent drainage from the wound. Patient placed empirically on IV antibiotics, orthopedics and nephrology consulted. Patient was found unresponsive in the room on 02/14/2017 after getting IV Dilaudid in the afternoon for leg pain. Patient had a 9+ minute asystole cardiac arrest was intubated and transferred to the ICU and underwent cooling and rewarming. Patient with no significant neurological improvement and was assessed by neurology who felt that given patient's current neurological status the meaningful neurological recovery was very dismal.  Patient also seen in consultation by ID and placed empirically on IV antibiotics which have been D escalated to IV Rocephin, Flagyl and vancomycin. Patient has been followed by nephrology and receiving hemodialysis during the hospitalization. Orthopedics consulted and patient seen by Dr. Sharol Given and is noted that patient has a dry gangrene involving the heel and Achilles of her left foot. It is noted that patient has a dry gangrene S with exposed bone. Recommendations from orthopedics was to proceed with a transtibial amputation which has been discussed multiple times in the office prior to admission. As per, orthopedics patient does not want to proceed with transtibial amputation and mother reinforcing patient's wishes at this time. Patient currently on empiric IV antibiotics. I assume patient's care since 03/04/2017.  Second Orthopedic opinion on 03/04/2017.  Assessment & Plan:  # acute respiratory  failure/vent dependent -Status post ATC.  -Continue trach care protocol. -PCCM following. 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  -Continue Toprol-XL. -added Iv metoprolol as needed for sinus tachycardia.  # end-stage renal disease on hemodialysis -Hemodialysis as per nephrologist. Monitor labs and blood pressure. Plan for dialysis today.  # moderate protein calorie malnutrition -Patient currently getting tube feeds. IR was consulted for PEG tube placement however due to patient's leukocytosis and intermittent fevers IR holding off on PEG tube secondary to concern for seeding. -Follow up on IR and palliative care.  # anemia of chronic disease and chronic kidney disease: Monitor CBC. No sign of bleeding.  # left foot osteomyelitis/gangrene  -Patient has been seen in consultation by orthopedics, Dr. Sharol Given who has recommended amputation however patient refusing amputation.  -Patient currently empirically on IV Rocephin, Flagyl and vancomycin with recommendations from ID to continue for a total of 10 days. Last dose on 03/07/2017. Patient continues to having fever and have worsening leukocytosis. Surgery is the only definitive treatment for the patient as per infectious disease. -TEE negative for endocarditis.  -Had a second opinion from orthopedics on 03/04/2017. As per Orthopedics note from 6/6, plan for meeting with the family on Saturday to discuss further intervention including possible surgery/amputation.  # acute metabolic encephalopathy/anoxic encephalopathy post cardiac arrest -opens eyes and following simple commands by moving her head. As per patient's  mother mental status is improving.  # diabetes mellitus type 2 with neuropathy -Continue sliding scale. Monitor blood sugar level.  # leukocytosis and intermittent fever:  due to dry gangrene. Evaluated by infectious disease.  Recommended no long-term antibiotics since the treatment his only surgical amputation.   #Goals of care: Discussion ongoing with palliative care team. Patient has poor prognosis.  # Diarrhea: check stool c diff. The stool c diff negative. Patient has a rectal tube. Loses stool likely  due to tube feeding as well. 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)   Diabetic osteomyelitis ([place])   Dehiscence of amputation stump (HCC)   Osteomyelitis (HCC)   Chronic pain syndrome   Cardiac arrest, cause unspecified ([place])   Pressure injury of skin   Wound dehiscence   Coma ([place])   Elevated liver enzymes   Jaundice   Encounter for central line placement   Protein calorie malnutrition ([place])   DNR (do not resuscitate) discussion   Palliative care by specialist  DVT prophylaxis: Heparin subcutaneous Code Status: Full code Family Communication: Discussed with the patient's mother at bedside Disposition Plan: Transfer to stepdown unit..    Consultants:   Infectious disease: Dr. Graylon Good 02/24/2017  Neurology Dr.Lindzen 02/24/2017  Gastroenterology: Dr. Watt Climes 02/19/2017  Wound care  PCCM: Dr Vaughan Browner 02/14/2017  Nephrology: Dr.Schertz 02/14/2017  Orthopedics: Dr. Durward Fortes 02/14/2017  Orthopedics: Dr. Sharol Given  Orthopedics second opinion consult by Dr Ninfa Linden on  03/04/2017  Procedures:  CT head 02/14/2017  CT abdomen and pelvis 03/01/2017  Right upper quadrant ultrasound 02/18/2017  TEE 03/02/2017  MRI brain 02/18/2017  Lower extremity Dopplers 02/13/2017 negative for DVT  EEG 02/15/2017>>>> findings consistent with moderate to severe global cerebral dysfunction. Consistent with anoxic injury given clinical course Antimicrobials: Clinidamycin 5/19 >> 5/21 Vancomycin 5/19 >> 5/25 >> restart 5/30.Marland Kitchen Zosyn 5/19 >> 5/22 Meropenem 5/22 >> 5/30 Flagyl 5/30 >> Ceftriaxone 5/30 >> Subjective: Seen and examined at bedside. Patient is opening her eyes and moving her head with the question. Unable to obtain review of system detail. Mother at bedside. Objective: Vitals:   03/05/17 0841 03/05/17 0932 03/05/17 0950 03/05/17 1000  BP:  111/79 111/74 105/77  Pulse:  (!) 121 (!) 115 (!) 115  Resp:  17 20 (!) 21  Temp: 97.9 F (36.6 C) 97.8 F (36.6 C)    TempSrc: Oral Oral    SpO2:  100% 100% 100%  Weight:  97.9 kg (215 lb 13.3 oz)    Height:        Intake/Output Summary (Last 24 hours) at 03/05/17 1022 Last data filed at 03/05/17 0700  Gross per 24 hour  Intake           868.75 ml  Output              300 ml  Net           568.75 ml   Filed Weights   03/04/17 0500 03/05/17 0330 03/05/17 0932  Weight: 96.7 kg (213 lb 3 oz) 96.7 kg (213 lb 3 oz) 97.9 kg (215 lb 13.3 oz)    Examination:  General exam: Ill-looking female with NG tube and tracheostomy lying on bed.  Neck: Tracheostomy site looks clean Respiratory system: Bilateral good air entry, no wheezing Cardiovascular system: S1 & S2 heard, regular tachycardic.  No pedal edema. Gastrointestinal system: Abdomen is nondistended, soft and nontender. Normal bowel sounds heard. Central nervous system: Opening her eyes and following simple commands by moving her head Extremities: Unable to assess. Skin: No rashes, lesions or ulcers Psychiatry: Unable to assess.     Data Reviewed: I  have personally reviewed following labs and imaging studies  CBC:  Recent Labs Lab 02/28/17 0309 03/02/17 0239 03/03/17 0252 03/04/17 0430 03/05/17 0242  WBC 24.1* 22.4* 21.7* 24.6* 26.7*  NEUTROABS  --   --   --  20.0*  --   HGB 9.1* 8.5* 8.5* 8.9* 9.1*  HCT 29.9* 28.2* 28.3* 29.7* 30.2*  MCV 96.5 96.2 97.6 96.1 97.1  PLT 614* 732* 672* 705* 017*   Basic Metabolic Panel:  Recent Labs Lab 03/02/17 0239 03/02/17 2357 03/03/17 0252 03/04/17 0430 03/05/17 0242  NA 137 134* 136 133* 138  K 3.6 4.0 4.5 3.4* 3.5  CL 98* 96* 95* 92* 95*  CO2 26 26 23 27 25   GLUCOSE 155* 191* 157* 215* 173*  BUN 67* 42* 42* 56* 91*  CREATININE 4.57* 3.12* 3.11* 3.52* 4.75*  CALCIUM 8.6* 8.3* 8.4* 8.9 9.4  MG  --  1.9 1.9  --   --   PHOS 3.5 2.6 2.7 2.8 3.3   GFR: Estimated Creatinine Clearance: 20 mL/min (A) (by C-G formula based on SCr of 4.75 mg/dL (H)). Liver Function Tests:  Recent Labs Lab 03/01/17 0956 03/02/17 0239 03/03/17 0252 03/04/17 0430 03/05/17 0242  ALBUMIN 2.0* 1.9* 2.0* 2.1* 2.1*   No results for input(s): LIPASE, AMYLASE in the last 168 hours. No results for input(s): AMMONIA in the last 168 hours. Coagulation Profile: No results for input(s): INR, PROTIME in the last 168 hours. Cardiac Enzymes: No results for input(s): CKTOTAL, CKMB, CKMBINDEX, TROPONINI in the last 168 hours. BNP (last 3 results) No results for input(s): PROBNP in the last 8760 hours. HbA1C: No results for input(s): HGBA1C in the last 72 hours. CBG:  Recent Labs Lab 03/04/17 1536 03/04/17 1948 03/04/17 2345 03/05/17 0339 03/05/17 0813  GLUCAP 200* 194* 189* 173* 173*   Lipid Profile: No results for input(s): CHOL, HDL, LDLCALC, TRIG, CHOLHDL, LDLDIRECT in the last 72 hours. Thyroid Function Tests: No results for input(s): TSH, T4TOTAL, FREET4, T3FREE, THYROIDAB in the last 72 hours. Anemia Panel: No results for input(s): VITAMINB12, FOLATE, FERRITIN, TIBC, IRON, RETICCTPCT in  the last 72 hours. Sepsis Labs: No results for input(s): PROCALCITON, LATICACIDVEN in the last 168 hours.  Recent Results (from the past 240 hour(s))  C difficile quick scan w PCR reflex     Status: None   Collection Time: 02/27/17  9:41 AM  Result Value Ref Range Status   C Diff antigen NEGATIVE NEGATIVE Final   C Diff toxin NEGATIVE NEGATIVE Final   C Diff interpretation No C. difficile detected.  Final  Culture, blood (Routine X 2) w Reflex to ID Panel     Status: None   Collection Time: 02/27/17 10:39 AM  Result Value Ref Range Status   Specimen Description BLOOD RIGHT HAND  Final   Special Requests IN PEDIATRIC BOTTLE Blood Culture adequate volume  Final   Culture NO GROWTH 5 DAYS  Final   Report Status 03/04/2017 FINAL  Final  Culture, blood (Routine X 2) w Reflex to ID Panel     Status: None   Collection Time: 02/27/17 10:49 AM  Result Value Ref Range Status   Specimen Description BLOOD RIGHT HAND  Final  Special Requests IN PEDIATRIC BOTTLE Blood Culture adequate volume  Final   Culture NO GROWTH 5 DAYS  Final   Report Status 03/04/2017 FINAL  Final  C difficile quick scan w PCR reflex     Status: None   Collection Time: 03/04/17  2:17 PM  Result Value Ref Range Status   C Diff antigen NEGATIVE NEGATIVE Final   C Diff toxin NEGATIVE NEGATIVE Final   C Diff interpretation No C. difficile detected.  Final         Radiology Studies: No results found.      Scheduled Meds: . calcium carbonate (dosed in mg elemental calcium)  2,000 mg of elemental calcium Oral TID  . chlorhexidine gluconate (MEDLINE KIT)  15 mL Mouth Rinse BID  . darbepoetin (ARANESP) injection - DIALYSIS  200 mcg Intravenous Q Tue-HD  . feeding supplement (NEPRO CARB STEADY)  1,000 mL Per Tube Q24H  . feeding supplement (PRO-STAT SUGAR FREE 64)  60 mL Per Tube QID  . [name]'s butt cream   Topical BID  . heparin  5,000 Units Subcutaneous Q8H  . insulin aspart  0-15 Units Subcutaneous Q4H  .  mouth rinse  15 mL Mouth Rinse QID  . metoprolol succinate  12.5 mg Oral QHS  . multivitamin  1 tablet Oral QHS  . pantoprazole sodium  40 mg Per Tube Q24H   Continuous Infusions: . sodium chloride 10 mL/hr at 03/03/17 1700  . sodium chloride    . sodium chloride    . sodium chloride    . sodium chloride    . sodium chloride    . sodium chloride    . cefTRIAXone (ROCEPHIN)  IV Stopped (03/04/17 1130)  . metronidazole Stopped (03/05/17 0505)  . vancomycin    . vancomycin Stopped (03/03/17 1036)     LOS: 19 days    Willie Plain Tanna Furry, MD [facility] Pager [phone]  If 7PM-7AM, please contact night-coverage [email or web address] Password [email or web address] 03/05/2017, 10:22 AM

## 2017-03-05 NOTE — Care Management Note (Addendum)
Case Management Note  Patient Details  Name: [name] MRN: [id number] Date of Birth: 1981/08/28  Subjective/Objective:     Pt admitted- 5/19 with complaints of left leg pain, found to have evidence of osteomyelitis with wound nonhealing, dehiscence, purulent drainage from the wound. Admitted for IV antibiotics, hemodialysis and possible surgery. Found unresponsive in the room on 5/19- cardiac arrest- intubated Patient currently on ventilator via trach with significantly lower mental status since code. Patient unable to care for herself and will require long term care at discharge. Patient from home and independent pta. Main support since hospitalized has been disabled mother who has stayed at bedside in ICU. Mother in [facility] with limited resources to care for self. Per nursing staff has been showering at hospital and washing her undergarments in sink, and unable to secure food for herself while here  6/7 1429 Beth Cape RN, BSN - patient transferred to SDU, Patient with [employer], lives with mother, who has been at bedside. She has a number 6 shiley trach 30%, palliative consulted, wbc elevated, plan for peg,she has cortrak right now.  MD recommendation is fro  Amputation.  CSW following for SNF placement.       6/11 1531 Beth Cape RN, BSN -  Patient was on trach collar, now back on vent, with non healing osteo/gangreene left leg wound s/p cardiac arrest, which patient does not want an amputation per ortho, conts on iv abx, receiving dialysis, tube feeds per cortrak, IR holding off on peg secondary to fevers and leuokocytosis.  CSW  Looking for placement   For Vent/Trach? Dialysis.    6/12 1510 Beth Cape RN, BSN- NCM and CSW spoke with mother Myrene Buddy at beside and spoke with Wandra Mannan, the other daughter on the phone  We discussed that patient was living with boyfriend  Before she was admitted her at hospital and she was indep.  She had decided previously before she was unable to  make decisions that she did not want an amputation.  Myrene Buddy , her mother states she is the spokes person for patient now.  We informed them that we know palliative has been speaking with them and wanted to let them know there options, she is on the vent, and the available snf for her needs right now is [facility]  in [place] and if they do decide to go with hospice that is located here in [place].  The sister , Wandra Mannan states the MD stated that the amputation is not an option now.  Myrene Buddy and Wandra Mannan will think about the options that the NCM and CSW told them about so they can make an appropriate  decision for their loved one.  NCM and CSW will check back with mother.   6/14 1246 Beth Cape RN, BSN -Move forward with amputation of left leg and subsequent placement of PEG. Per palliative note for 6/14, and they still plan a meeting with family on Friday.   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   7/9 1827 Beth Cape RN, BSN-  6/20 last night on vent, on trach collar for several days, changed to #4 cuffless,s/p fees 6/27, on dysph 1 diet,  AKA was done on 6/15, wound vac removed 6/20, revision of AKA on 7/5 with wound vac in place. CSW following for placement for HD, trach.  7/10 Beth Cape RN, BSN- discussed in LOS, appropriate for continued stay.   7/12 Beth Cape RN, BSN- Trach Dependent resp failure, ESRD, anoxic brain injury secondary to cardiac arrest, l aka, chf, using passy-muir valve during the day.  Trach capped on Wed for trial, conts on iv abx, trach 28%, plan is SNF.  CSW following for placement. Discussed in LOS , appropriate for continued stay.   7/16 1729 Beth Cape RN, BSN - decannulated today.  7/17 1544 Beth Cape RN,BSN- decannulated yesterday, sit up in chair today for HD, she will need to be clipped for Diaylsis.  CSW faxed out  to SNF.   Discussed in LOS appropriate for continued stay.  PCP [name]      Action/Plan: NCM will follow along with CSW for dc needs.   Expected Discharge Date:                  Expected Discharge Plan:  Skilled Nursing Facility  In-House Referral:  Clinical Social Work  Discharge planning Services  CM Consult  Post Acute Care Choice:    Choice offered to:     DME Arranged:    DME Agency:     HH Arranged:    HH Agency:     Status of Service:  In process, will continue to follow  If discussed at Long Length of Stay Meetings, dates discussed:    Additional Comments:  Leone Haven, RN 03/05/2017, 2:29 PM

## 2017-03-05 NOTE — Progress Notes (Signed)
HD tx completed @ 1320 w/o problem, only one bp issue that responded to Albumin admin, UF goal met, blood rinsed back, report given to primary nurse

## 2017-03-05 NOTE — Progress Notes (Signed)
PULMONARY / CRITICAL CARE MEDICINE   Name: [name] MRN: [id number] DOB: 1981-07-08    ADMISSION DATE:  02/14/2017 CONSULTATION DATE:  02/14/2017  REFERRING MD:  Feliz Beam MD  CHIEF COMPLAINT:  Sepsis, cardiac arrest  BRIEF SUMMARY:   36 year old female w/ ESRD d/t poorly controlled DM, bilateral transmetatarsal foot amputations, obesity, HTN, medical non-compliance as well as NICM. Admitted 5/19 with complaints of left leg pain, found to have radiological evidence of osteomyelitis with wound nonhealing, dehiscence, purulent drainage from the wound. Admitted for IV antibiotics, hemodialysis and possible surgery.  Found unresponsive in the room on 5/19. She had chest compressions, epi bicarb given. She was intubated by anesthesia and had ROSC in approximately 9 minutes. Downtime prior to code is unknown. She got dilaudid in the afternoon for leg pain.  SUBJECTIVE:  RN reports pt more alert, has not weaned yet due to HD.    VITAL SIGNS: BP 103/76 (BP Location: Right Arm)   Pulse (!) 122   Temp 97.8 F (36.6 C) (Oral)   Resp (!) 24   Ht 5' 8.5" (1.74 m)   Wt 215 lb 13.3 oz (97.9 kg) Comment: w/ bilat boots on  LMP  (LMP Unknown) Comment: Patient trached  SpO2 100%   BMI 32.34 kg/m   VENTILATOR SETTINGS: Vent Mode: PRVC FiO2 (%):  [30 %-35 %] 30 % Set Rate:  [16 bmp] 16 bmp Vt Set:  [520 mL] 520 mL PEEP:  [5 cmH20] 5 cmH20 Plateau Pressure:  [19 cmH20-25 cmH20] 19 cmH20  INTAKE / OUTPUT: I/O last 3 completed shifts: In: 1768.8 [I.V.:360; NG/GT:608.8; IV Piggyback:800] Out: 300 [Stool:300]  General: chronically ill appearing female in NAD on vent  HEENT: MM pink/moist, trach midline c/d/i  Neuro: opens eyes, follows commands CV: s1s2 rrr, no m/r/g PULM: even/non-labored, lungs bilaterally coarse, scattered rhonchi  JX:BJYN, non-tender, bsx4 active  Extremities: warm/dry, no edema Skin: no rashes or lesions, multiple tattoos    LABS:  BMET  Recent Labs Lab  03/03/17 0252 03/04/17 0430 03/05/17 0242  NA 136 133* 138  K 4.5 3.4* 3.5  CL 95* 92* 95*  CO2 23 27 25   BUN 42* 56* 91*  CREATININE 3.11* 3.52* 4.75*  GLUCOSE 157* 215* 173*    Electrolytes  Recent Labs Lab 03/02/17 2357 03/03/17 0252 03/04/17 0430 03/05/17 0242  CALCIUM 8.3* 8.4* 8.9 9.4  MG 1.9 1.9  --   --   PHOS 2.6 2.7 2.8 3.3    CBC  Recent Labs Lab 03/03/17 0252 03/04/17 0430 03/05/17 0242  WBC 21.7* 24.6* 26.7*  HGB 8.5* 8.9* 9.1*  HCT 28.3* 29.7* 30.2*  PLT 672* 705* 693*    No results for input(s): PHART, PCO2ART, PO2ART in the last 168 hours.  Liver Enzymes  Recent Labs Lab 03/03/17 0252 03/04/17 0430 03/05/17 0242  ALBUMIN 2.0* 2.1* 2.1*   Glucose  Recent Labs Lab 03/04/17 1221 03/04/17 1536 03/04/17 1948 03/04/17 2345 03/05/17 0339 03/05/17 0813  GLUCAP 181* 200* 194* 189* 173* 173*   STUDIES:  Lower extremity ultrasound 5/18 >> no evidence of DVT CT head 5/19 >> normal exam Echo 5/20 >> EF 30-35%, increased LVF. Diffuse hypokinesis, akinesis of basilar mid-inferior myocardium, grade 2 diastolic dysfxn  EEG 5/20 >> finding c/w mod to severe global cerebral dysfxn. C/w anoxic injury given clinical course  LE [place] 5/21 >> negative MRI brain 5/24 >> ?thrombosed cortical vein, otherwise normal TEE 6/4 >> mild MR, normal AV, mild TR, mild PR, EF  30-35%, diffuse hypokinesis, no thrombus, no PFO, normal RV, moderate pericardial effusion with synechia suggesting some chronicity, no vegetations   CULTURES: Bcx 5/19 X 2 >> negative Blood cultures 6/1 >> negative c diff 6/1 >> negative  ANTIBIOTICS: Clinidamycin 5/19 >> 5/21 Vancomycin 5/19 >> 5/25 >> restart 5/30 Zosyn 5/19 >> 5/22 Meropenem 5/22 >> 5/30 Flagyl 5/30 >> Ceftriaxone 5/30 >>  SIGNIFICANT EVENTS: 5/19  Admit, cardiac arrest, transfer to ICU; hypothermia protocol started 5/24  Off pressors.  MRI w/out evidence of ischemia  6/03  Tolerated ATC couple hours, then back  to vent, HD   LINES/TUBES: ETT 5/19 >> 5/31 Lt IJ CVL 5/19 >> out Trach 5/31 >>   ASSESSMENT / PLAN:  Discussion:  36 y/o F with PMH of poorly controlled DM, ESRD on HD and known gangrene of L achilles heel, admitted with non-healing wounds.  Admitted per TRH.  Received dilaudid for pain and subsequently suffered a cardiac arrest. Tx to ICU.   Pulmonary: Acute respiratory failure - in setting of cardiac arrest, chronic critical illness, requiring ventilator for support, making progress with weaning P: PRVC as rest mode  PSV to ATC as tolerated  Trach care per protocol  Clip trach sutures n 6/9 Push PT, nutrition efforts  Intermittent CXR Primary working on source control for foot > second opinion obtained, family wanting to discuss on Sat.   All other medical issues below per [facility], appreciate assistance.  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 Per ID recommend total 10 days of antibiotics.  Continue ceftriaxone, flagyl plus vancomycin. Endocarditis Ruled Out 6/4 on TEE Diarrhea  DM type II Acute metabolic encephalopathy > by report improved yesterday Anoxic encephalopathy after cardiac arrest Hx of DM neuropathy    DVT prophylaxis - SQ heparin SUP - protonix Nutrition - tube feeds Goals of care - full code  Global:  TRH for primary medical care, appreciate assistance. PCCM will continue to follow for trach care.  Will follow up 6/11 unless new needs arise.    Canary Brim, NP-C [facility] Pgr: [phone] or if no answer [phone] 03/05/2017, 12:35 PM  STAFF NOTE: I, Rory Percy, MD FACP have personally reviewed patient's available data, including medical history, events of note, physical  examination and test results as part of my evaluation. I have discussed with resident/NP and other care providers such as pharmacist, RN and RRT. In addition, I personally evaluated patient and elicited key findings of: well known to me, opens eyes, lethargic, lungs clear overall anterior, wound trach clean, dry, neuro status about the same, just had HD, my understanding is she did trach collar x 12 hours but none today, for trach collar this afternoon, then goals in am should be for 18-24 hours, we should dc sutures in am , I updated mother myself  Mcarthur Rossetti. Tyson Alias, MD, FACP Pgr: [phone] [facility] 03/05/2017 4:13 PM

## 2017-03-05 NOTE — Progress Notes (Signed)
Pt discussed in [facility]- CSW following for possible vent SNF placement  Burna Sis, LCSW Clinical Social Worker [phone]

## 2017-03-05 NOTE — Progress Notes (Signed)
Admit: 02/14/2017 LOS: 60  69F ESRD [place] admitted with nonhealing L leg wound and s/p cardiac arrest 02/14/17  Subjective:  Orthopedics 2nd opinion yesterday, note reveiwed Palliatie care eval yesterday For HD today Cont to spike fevers Mother says contniues to engage, follow commands  06/06 0701 - 06/07 0700 In: 958.8 [I.V.:240; NG/GT:368.8; IV Piggyback:350] Out: 300 [Stool:300]  Filed Weights   03/03/17 0450 03/04/17 0500 03/05/17 0330  Weight: 98.9 kg (218 lb 0.6 oz) 96.7 kg (213 lb 3 oz) 96.7 kg (213 lb 3 oz)    Scheduled Meds: . calcium carbonate (dosed in mg elemental calcium)  2,000 mg of elemental calcium Oral TID  . chlorhexidine gluconate (MEDLINE KIT)  15 mL Mouth Rinse BID  . darbepoetin (ARANESP) injection - DIALYSIS  200 mcg Intravenous Q Tue-HD  . feeding supplement (NEPRO CARB STEADY)  1,000 mL Per Tube Q24H  . feeding supplement (PRO-STAT SUGAR FREE 64)  60 mL Per Tube QID  . [name]'s butt cream   Topical BID  . heparin  5,000 Units Subcutaneous Q8H  . insulin aspart  0-15 Units Subcutaneous Q4H  . mouth rinse  15 mL Mouth Rinse QID  . metoprolol succinate  12.5 mg Oral QHS  . multivitamin  1 tablet Oral QHS  . pantoprazole sodium  40 mg Per Tube Q24H   Continuous Infusions: . sodium chloride 10 mL/hr at 03/03/17 1700  . sodium chloride    . sodium chloride    . sodium chloride    . sodium chloride    . sodium chloride    . sodium chloride    . cefTRIAXone (ROCEPHIN)  IV Stopped (03/04/17 1130)  . metronidazole Stopped (03/05/17 0505)  . vancomycin Stopped (03/03/17 1036)   PRN Meds:.sodium chloride, sodium chloride, sodium chloride, sodium chloride, sodium chloride, sodium chloride, acetaminophen (TYLENOL) oral liquid 160 mg/5 mL, albuterol, alteplase, fentaNYL (SUBLIMAZE) injection, heparin, heparin, heparin, heparin, lidocaine (PF), lidocaine-prilocaine, metoprolol tartrate, pentafluoroprop-tetrafluoroeth  Current Labs: reviewed    Physical  Exam:  Blood pressure 111/87, pulse (!) 113, temperature 100.1 F (37.8 C), temperature source Axillary, resp. rate (!) 22, height 5' 8.5" (1.74 m), weight 96.7 kg (213 lb 3 oz), SpO2 100 %. Awake, alert; tracks Follows commands; shows me 2 fingers with both hands Tachy, regular, nl s1s2 Coarse bs b/l Trach with ATC NGT in place Feet in boots AVF +B/T  Dialysis Orders: Center: [facility]  On TueThuSat, 4 hrs 15 min, Optiflux 200 EDW 114 (kg), 2.0 K, 2.0 Ca, AVF LUA  Iron Sucrose (Venofer) 50 mg IVP During Dialysis 1X Week  Mircera 75 mcg IV q  2 weeks ( last on 02/05/17) Vitamin D (Calcitriol) Oral 0.75 mcg  Po q hd   A 1.  ESRD THS [facility] 2. Cardiac Arrest 5/19 3. VDRF s/p trach, weaning 4. Nonhealing L foot wound, amputation recommended, ortho and [facility] have followed 5. HTN/Vol 6. DM2 7. NICM, hx/o S+D CHF 8. Anemia, max aranesp qTues; avoid Fe with infection 9. CKD BMD: P and Ca ok currently  P 1. HD today on THS Schedue, 4K bath; 3L UF, SBP > 90, tight heparin   Pearson Grippe MD 03/05/2017, 8:28 AM   Recent Labs Lab 03/03/17 0252 03/04/17 0430 03/05/17 0242  NA 136 133* 138  K 4.5 3.4* 3.5  CL 95* 92* 95*  CO2 _0 GLUCOSE 157* 215* 173*  BUN 42* 56* 91*  CREATININE 3.11* 3.52* 4.75*  CALCIUM 8.4* 8.9 9.4  PHOS 2.7 2.8  3.3    Recent Labs Lab 03/03/17 0252 03/04/17 0430 03/05/17 0242  WBC 21.7* 24.6* 26.7*  NEUTROABS  --  20.0*  --   HGB 8.5* 8.9* 9.1*  HCT 28.3* 29.7* 30.2*  MCV 97.6 96.1 97.1  PLT 672* 705* 693*

## 2017-03-05 NOTE — Progress Notes (Signed)
SLP Cancellation Note  Patient Details Name: Shiloe Blancett MRN: [id number] DOB: 1980-11-16   Cancelled treatment:       Reason Eval/Treat Not Completed: Patient at procedure or test/unavailable. Currently having dialysis. Will f/u for PMSV.    Juanitta Earnhardt, [name] 03/05/2017, 11:19 AM

## 2017-03-06 ENCOUNTER — Other Ambulatory Visit (HOSPITAL_COMMUNITY): Payer: Medicaid Other

## 2017-03-06 LAB — GLUCOSE, CAPILLARY
GLUCOSE-CAPILLARY: 150 mg/dL — AB (ref 65–99)
GLUCOSE-CAPILLARY: 141 mg/dL — AB (ref 65–99)
GLUCOSE-CAPILLARY: 150 mg/dL — AB (ref 65–99)
Glucose-Capillary: 165 mg/dL — ABNORMAL HIGH (ref 65–99)
Glucose-Capillary: 169 mg/dL — ABNORMAL HIGH (ref 65–99)
Glucose-Capillary: 221 mg/dL — ABNORMAL HIGH (ref 65–99)

## 2017-03-06 NOTE — Progress Notes (Signed)
Patient placed on 35% trach collar.  Currently tolerating well.  Will continue to monitor.  

## 2017-03-06 NOTE — Progress Notes (Signed)
PROGRESS NOTE    [name]  EKC:[id number] DOB: Nov 19, 1980 DOA: 02/14/2017 PCP: Vicenta Aly, FNP   Brief Narrative: 36 year old female end-stage renal disease on hemodialysis secondary to poorly controlled diabetes mellitus, bilateral transmetatarsal foot amputations, obesity, hypertension, medical noncompliance, nonischemic cardiomyopathy who was admitted 02/14/2017 with left leg pain and found to have osteomyelitis with wound nonhealing, dehiscence, purulent drainage from the wound. Patient placed empirically on IV antibiotics, orthopedics and nephrology consulted. Patient was found unresponsive in the room on 02/14/2017 after getting IV Dilaudid in the afternoon for leg pain. Patient had a 9+ minute asystole cardiac arrest was intubated and transferred to the ICU and underwent cooling and rewarming. Patient with no significant neurological improvement and was assessed by neurology who felt that given patient's current neurological status the meaningful neurological recovery was very dismal.  Patient also seen in consultation by ID and placed empirically on IV antibiotics which have been D escalated to IV Rocephin, Flagyl and vancomycin. Patient has been followed by nephrology and receiving hemodialysis during the hospitalization. Orthopedics consulted and patient seen by Dr. Sharol Given and is noted that patient has a dry gangrene involving the heel and Achilles of her left foot. It is noted that patient has a dry gangrene S with exposed bone. Recommendations from orthopedics was to proceed with a transtibial amputation which has been discussed multiple times in the office prior to admission. As per, orthopedics patient does not want to proceed with transtibial amputation and mother reinforcing patient's wishes at this time. Patient currently on empiric IV antibiotics. I assume patient's care since 03/04/2017.  Second Orthopedic opinion on 03/04/2017.  Assessment & Plan:  # acute respiratory  failure/vent dependent -Status post ATC.  -Continue trach care protocol. -PCCM following. Now on trach collar.  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  -Continue Toprol-XL. -on metoprolol for sinus tachycardia. Monitor HR  # end-stage renal disease on hemodialysis -Hemodialysis as per nephrologist. Monitor labs and blood pressure.   # moderate protein calorie malnutrition -Patient currently getting tube feeds. IR was consulted for PEG tube placement however due to patient's leukocytosis and intermittent fevers IR holding off on PEG tube secondary to concern for seeding. -Follow up on IR and palliative care.  # anemia of chronic disease and chronic kidney disease: Monitor CBC. No sign of bleeding.  # left foot osteomyelitis/gangrene  -Patient has been seen in consultation by orthopedics, Dr. Sharol Given who has recommended amputation however patient refusing amputation.  -Patient currently empirically on IV Rocephin, Flagyl and vancomycin with recommendations from ID to continue for a total of 10 days. Last dose on 03/07/2017. Patient continues to having fever and have worsening leukocytosis. Surgery is the only definitive treatment for the patient as per infectious disease. I discussed with Dr. Linus Salmons from infectious disease today who recommended to stop antibiotics after tomorrow's dose since this is not the curative treatment. -TEE negative for endocarditis.  -Had a second opinion from orthopedics on 03/04/2017. As per Orthopedics note from 6/6, plan for meeting with the family on Saturday to discuss further intervention including possible surgery/amputation. Will follow ortho's plan.  # acute metabolic  encephalopathy/anoxic encephalopathy post cardiac arrest -opens eyes and following simple commands by moving her head. Mentally status is stable.  # diabetes mellitus type 2 with neuropathy -Continue sliding scale. Monitor blood  sugar level.  # leukocytosis and intermittent fever:  due to dry gangrene. Evaluated by infectious disease. Recommended no long-term antibiotics since the treatment his only surgical amputation.  -Minimize blood draws for this critically ill patient.  #Goals of care: Discussion ongoing with palliative care team. Patient has poor prognosis. Palliative care appreciated.  # Diarrhea: check stool c diff. The stool c diff negative. Patient has a rectal tube. Loses stool likely  due to tube feeding as well. 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)   Diabetic osteomyelitis ([place])   Dehiscence of amputation stump (HCC)   Osteomyelitis (HCC)   Chronic pain syndrome   Cardiac arrest, cause unspecified ([place])   Pressure injury of skin   Wound dehiscence   Coma ([place])   Elevated liver enzymes   Jaundice   Encounter for central line placement   Protein calorie malnutrition ([place])   DNR (do not resuscitate) discussion   Palliative care by specialist   Tachycardia  DVT prophylaxis: Heparin subcutaneous Code Status: Full code Family Communication: Discussed with the patient's mother at bedside Disposition Plan: Transfer to stepdown unit..    Consultants:   Infectious disease: Dr. Graylon Good 02/24/2017  Neurology Dr.Lindzen 02/24/2017  Gastroenterology: Dr. Watt Climes  02/19/2017  Wound care  PCCM: Dr Vaughan Browner 02/14/2017  Nephrology: Dr.Schertz 02/14/2017  Orthopedics: Dr. Durward Fortes 02/14/2017  Orthopedics: Dr. Sharol Given  Orthopedics second opinion consult by Dr Ninfa Linden on 03/04/2017   Procedures:  CT head 02/14/2017  CT abdomen and pelvis 03/01/2017  Right upper quadrant ultrasound 02/18/2017  TEE 03/02/2017  MRI brain 02/18/2017  Lower extremity Dopplers 02/13/2017 negative for DVT  EEG 02/15/2017>>>> findings consistent with moderate to severe global cerebral dysfunction. Consistent with anoxic injury given clinical course Antimicrobials: Clinidamycin 5/19 >> 5/21 Vancomycin 5/19 >> 5/25 >> restart 5/30.Marland Kitchen Zosyn 5/19 >> 5/22 Meropenem 5/22 >> 5/30 Flagyl 5/30 >> Ceftriaxone 5/30 >> Subjective: Seen and examined at bedside. No new event. Review of systems Limited. Mother at bedside. Updated current plan of care. Objective: Vitals:   03/06/17 0406 03/06/17 0457 03/06/17 0809 03/06/17 0900  BP:    105/70  Pulse: (!) 113 (!) 113  (!) 123  Resp: 20 12 (!) 38 (!) 35  Temp:  98.7 F (37.1 C)  98.7 F (37.1 C)  TempSrc:  Oral  Oral  SpO2: 100% 100%  99%  Weight:      Height:        Intake/Output Summary (Last 24 hours) at 03/06/17 1127 Last data filed at 03/06/17 0900  Gross per 24 hour  Intake          1211.75 ml  Output             3200 ml  Net         -1988.25 ml   Filed Weights   03/05/17 0932 03/05/17 1345 03/06/17 0328  Weight: 97.9 kg (215 lb 13.3 oz) 94.9 kg (209 lb 3.5 oz) 95.6 kg (210 lb 12.2 oz)    Examination:  General exam:Ill-looking female with NG tube and tracheostomy lying on bed Neck: Tracheostomy site looks clean Respiratory  system: Coarse breath sounds bilateral, no wheezing Cardiovascular system: S1 & S2 heard, regular tachycardic.  No pedal edema. Gastrointestinal system: Abdomen is nondistended, soft and nontender. Normal bowel sounds heard. Central nervous system: Opening her eyes and following  simple commands by moving her head, unchanged Extremities: Unable to assess. Skin: No rashes, lesions or ulcers Psychiatry: Unable to assess.     Data Reviewed: I have personally reviewed following labs and imaging studies  CBC:  Recent Labs Lab 02/28/17 0309 03/02/17 0239 03/03/17 0252 03/04/17 0430 03/05/17 0242  WBC 24.1* 22.4* 21.7* 24.6* 26.7*  NEUTROABS  --   --   --  20.0*  --   HGB 9.1* 8.5* 8.5* 8.9* 9.1*  HCT 29.9* 28.2* 28.3* 29.7* 30.2*  MCV 96.5 96.2 97.6 96.1 97.1  PLT 614* 732* 672* 705* 024*   Basic Metabolic Panel:  Recent Labs Lab 03/02/17 0239 03/02/17 2357 03/03/17 0252 03/04/17 0430 03/05/17 0242  NA 137 134* 136 133* 138  K 3.6 4.0 4.5 3.4* 3.5  CL 98* 96* 95* 92* 95*  CO2 26 26 23 27 25   GLUCOSE 155* 191* 157* 215* 173*  BUN 67* 42* 42* 56* 91*  CREATININE 4.57* 3.12* 3.11* 3.52* 4.75*  CALCIUM 8.6* 8.3* 8.4* 8.9 9.4  MG  --  1.9 1.9  --   --   PHOS 3.5 2.6 2.7 2.8 3.3   GFR: Estimated Creatinine Clearance: 19.8 mL/min (A) (by C-G formula based on SCr of 4.75 mg/dL (H)). Liver Function Tests:  Recent Labs Lab 03/01/17 0956 03/02/17 0239 03/03/17 0252 03/04/17 0430 03/05/17 0242  ALBUMIN 2.0* 1.9* 2.0* 2.1* 2.1*   No results for input(s): LIPASE, AMYLASE in the last 168 hours. No results for input(s): AMMONIA in the last 168 hours. Coagulation Profile: No results for input(s): INR, PROTIME in the last 168 hours. Cardiac Enzymes: No results for input(s): CKTOTAL, CKMB, CKMBINDEX, TROPONINI in the last 168 hours. BNP (last 3 results) No results for input(s): PROBNP in the last 8760 hours. HbA1C: No results for input(s): HGBA1C in the last 72 hours. CBG:  Recent Labs Lab 03/05/17 1603 03/05/17 2048 03/06/17 0003 03/06/17 0438 03/06/17 0903  GLUCAP 160* 181* 221* 150* 141*   Lipid Profile: No results for input(s): CHOL, HDL, LDLCALC, TRIG, CHOLHDL, LDLDIRECT in the last 72 hours. Thyroid Function Tests: No results for  input(s): TSH, T4TOTAL, FREET4, T3FREE, THYROIDAB in the last 72 hours. Anemia Panel: No results for input(s): VITAMINB12, FOLATE, FERRITIN, TIBC, IRON, RETICCTPCT in the last 72 hours. Sepsis Labs: No results for input(s): PROCALCITON, LATICACIDVEN in the last 168 hours.  Recent Results (from the past 240 hour(s))  C difficile quick scan w PCR reflex     Status: None   Collection Time: 02/27/17  9:41 AM  Result Value Ref Range Status   C Diff antigen NEGATIVE NEGATIVE Final   C Diff toxin NEGATIVE NEGATIVE Final   C Diff interpretation No C. difficile detected.  Final  Culture, blood (Routine X 2) w Reflex to ID Panel     Status: None   Collection Time: 02/27/17 10:39 AM  Result Value Ref Range Status   Specimen Description BLOOD RIGHT HAND  Final   Special Requests IN PEDIATRIC BOTTLE Blood Culture adequate volume  Final   Culture NO GROWTH 5 DAYS  Final   Report Status 03/04/2017 FINAL  Final  Culture, blood (Routine X 2) w Reflex to ID Panel     Status: None   Collection Time: 02/27/17 10:49  AM  Result Value Ref Range Status   Specimen Description BLOOD RIGHT HAND  Final   Special Requests IN PEDIATRIC BOTTLE Blood Culture adequate volume  Final   Culture NO GROWTH 5 DAYS  Final   Report Status 03/04/2017 FINAL  Final  C difficile quick scan w PCR reflex     Status: None   Collection Time: 03/04/17  2:17 PM  Result Value Ref Range Status   C Diff antigen NEGATIVE NEGATIVE Final   C Diff toxin NEGATIVE NEGATIVE Final   C Diff interpretation No C. difficile detected.  Final         Radiology Studies: No results found.      Scheduled Meds: . calcium carbonate (dosed in mg elemental calcium)  2,000 mg of elemental calcium Oral TID  . chlorhexidine gluconate (MEDLINE KIT)  15 mL Mouth Rinse BID  . darbepoetin (ARANESP) injection - DIALYSIS  200 mcg Intravenous Q Tue-HD  . feeding supplement (NEPRO CARB STEADY)  1,000 mL Per Tube Q24H  . feeding supplement (PRO-STAT  SUGAR FREE 64)  60 mL Per Tube QID  . [name]'s butt cream   Topical BID  . heparin  5,000 Units Subcutaneous Q8H  . insulin aspart  0-15 Units Subcutaneous Q4H  . mouth rinse  15 mL Mouth Rinse QID  . metoprolol succinate  12.5 mg Oral QHS  . multivitamin  1 tablet Oral QHS  . pantoprazole sodium  40 mg Per Tube Q24H   Continuous Infusions: . sodium chloride 10 mL/hr at 03/03/17 1700  . sodium chloride 20 mL/hr at 03/05/17 2107  . sodium chloride    . sodium chloride    . sodium chloride    . sodium chloride    . cefTRIAXone (ROCEPHIN)  IV Stopped (03/05/17 1554)  . metronidazole Stopped (03/06/17 0546)     LOS: 20 days    Dron Tanna Furry, MD [facility] Pager [phone]  If 7PM-7AM, please contact night-coverage [email or web address] Password [facility] 03/06/2017, 11:27 AM

## 2017-03-06 NOTE — Progress Notes (Addendum)
     Called by Gloriajean Dell RN on 9M about a bedside TEE scheduled for today by Laverda Page NP. Per review of notes and discussion with Dr. Ronalee Belts, she already had a TEE on 6/4 and there is no plan for repeat TEE. I also called Laverda Page who was unaware that there were orders placed under her name. This is very confusing and we are unsure how and why these orders were placed. Dr. Lisbeth Renshaw and I have told Gloriajean Dell to DISCONTINUE these orders and feed patient and administer medications. Will do a safety zone portal about this.  Cline Crock PA-C  [facility]

## 2017-03-06 NOTE — Evaluation (Signed)
Passy-Muir Speaking Valve - Evaluation Patient Details  Name: Georgenia Dunaway MRN: [id number] Date of Birth: 02-28-1981  Today's Date: 03/06/2017 Time: 1441-1511 SLP Time Calculation (min) (ACUTE ONLY): 30 min  Past Medical History:  Past Medical History:  Diagnosis Date  . Anemia   . Arthritis    "left hand" (09/15/2013)  . Asthma   . CHF (congestive heart failure) (HCC)   . Chronic bronchitis (HCC)    "just about q yr" (09/15/2013)  . Chronic kidney disease    "low kidney function" (09/15/2013) ,  T/Th/Sa  . COPD (chronic obstructive pulmonary disease) (HCC)   . Coronary artery disease   . Hyperlipidemia   . Hypertension   . Migraine    "get them alot" (09/15/2013)  . Myocardial infarction [facility]) 04/2015   NSTEMI  . Normal coronary arteries    by cardiac catheterization 09/20/13  . Peripheral vascular disease (HCC)   . Pneumonia    "couple times; have it now" (09/15/2013)  . PONV (postoperative nausea and vomiting)   . Restless legs   . Shortness of breath    "just recently; related to the pneumonia" (09/15/2013)  . Type 1 diabetes mellitus (HCC)    type 2   Past Surgical History:  Past Surgical History:  Procedure Laterality Date  . AMPUTATION Right 06/25/2016   Procedure: Amputation Right Great Toe at the Metatarsophalangeal Joint;  Surgeon: Nadara Mustard, MD;  [facility] OR;  Service: Orthopedics;  Laterality: Right;  . AMPUTATION Bilateral 10/08/2016   Procedure: Bilateral Transmetatarsal Amputation;  Surgeon: Nadara Mustard, MD;  Location: [facility] OR;  Service: Orthopedics;  Laterality: Bilateral;  . AV FISTULA PLACEMENT Left 03/27/2015   Procedure: CREATION RADIAL CEPHALIC ARTERIOVENOUS FISTULA;  Surgeon: Chuck Hint, MD;  Location: [facility] OR;  Service: Vascular;  Laterality: Left;  . AV FISTULA PLACEMENT Left 11/23/2015   Procedure:  LEFT ARM BASILIC VEIN TRANSPOSITION  ;  Surgeon: Chuck Hint, MD;  Location: [facility] OR;  Service: Vascular;  Laterality: Left;  .  CESAREAN SECTION  1999; 2006  . CORONARY ANGIOGRAM  09/20/2013   Procedure: CORONARY ANGIOGRAM;  Surgeon: Runell Gess, MD;  Location: [facility] CATH LAB;  Service: Cardiovascular;;  . FINGER SURGERY Left 1985   3rd and 4th digits reconstructed after cut off" (09/15/2013)  . PERIPHERAL VASCULAR CATHETERIZATION N/A 09/05/2016   Procedure: Abdominal Aortogram;  Surgeon: Sherren Kerns, MD;  Location: [facility] INVASIVE CV LAB;  Service: Cardiovascular;  Laterality: N/A;  . PERIPHERAL VASCULAR CATHETERIZATION Bilateral 09/05/2016   Procedure: Lower Extremity Angiography;  Surgeon: Sherren Kerns, MD;  Location: [facility] INVASIVE CV LAB;  Service: Cardiovascular;  Laterality: Bilateral;  . PERIPHERAL VASCULAR CATHETERIZATION Right 09/05/2016   Procedure: Peripheral Vascular Balloon Angioplasty;  Surgeon: Sherren Kerns, MD;  Location[facility] INVASIVE CV LAB;  Service: Cardiovascular;  Laterality: Right;  peroneal and AT  . SHOULDER ARTHROSCOPY WITH BICEPSTENOTOMY Right 05/10/2015   Procedure: RIGHT SHOULDER ARTHROSCOPY WITH BICEPS TENOTOMY, DEBRIDEMENT LABRAL TEAR;  Surgeon: Jones Broom, MD;  Location: [facility] OR;  Service: Orthopedics;  Laterality: Right;  Right shoulder arthroscopy biceps tenotomy, debridement labral tear  . STUMP REVISION Left 01/21/2017   Procedure: . Revision Left Transmetatarsal Amputation;  Surgeon: Nadara Mustard, MD;  Location: [facility] OR;  Service: Orthopedics;  Laterality: Left;  . TONSILLECTOMY  1997  . TUBAL LIGATION  2006   HPI:  Sequoya Moes a 36 y.o.femalewith a hist20219ory of ESRD on HD T TH S, COPD/ Asthma , D s/p  MI 04/2015, HLD, HTN, CHF, chronic anemia, DM, and a history of L foot partial amputation 09/2016 with revision in 12/2016 for dehiscence, presenting to the ED with worsening LLE pain, swelling, increased drainage at the stump, and chills. ETT 5/19 and trach'd 5/31. Hospital course included cardiac arrest, transfer to ICU   Assessment / Plan / Recommendation Clinical  Impression  Pt with weak reflexive cough during deep suctioning via RN and no attempts to cough on command (some degree of cognitive impairment). Tolerated deflation of cuff with vitals in the upper range of normal. Exhaled air consistently blew speaking valve off trach hub frequently after 1 second post placement and reaching 10 seconds at end of assessment. No vocalizations audible and no attempts when requested. Appeared to mouth word x 1 (mom reports inconsistent mouthing recently). PMSV trials/intervention will continue with ST only.  SLP Visit Diagnosis: Aphonia (R49.1)    SLP Assessment  Patient needs continued Speech Lanaguage Pathology Services    Follow Up Recommendations   (TBD)    Frequency and Duration min 2x/week  2 weeks    PMSV Trial PMSV was placed for: intervals up to 10- 12 seconds Able to redirect subglottic air through upper airway: Yes Able to Attain Phonation: No attempt to phonate Voice Quality:  (n/a) Able to Expectorate Secretions: No Breath Support for Phonation: Moderately decreased Intelligibility: Unable to assess (comment) Respirations During Trial:  (25-30) SpO2 During Trial: 97 % Pulse During Trial: 111 Behavior: Alert   Tracheostomy Tube       Vent Dependency  FiO2 (%): 35 %    Cuff Deflation Trial  GO Tolerated Cuff Deflation: Yes Length of Time for Cuff Deflation Trial: 28 min Behavior: Alert (needed cues)        [name] 03/06/2017, 4:36 PM   [name] Luisantonio Adinolfi M.Ed [employer] [phone]

## 2017-03-06 NOTE — Progress Notes (Signed)
Admit: 02/14/2017 LOS: 70  66F ESRD [facility] admitted with nonhealing L leg wound and s/p cardiac arrest 02/14/17  Subjective:  HD Yesterday: 3L UF, post weight on bed scale 94.9kg No new events, mother in room, updated  06/07 0701 - 06/08 0700 In: 889.3 [I.V.:279.5; NG/GT:259.8; IV Piggyback:350] Out: 3200 [Stool:200]  Filed Weights   03/05/17 0932 03/05/17 1345 03/06/17 0328  Weight: 97.9 kg (215 lb 13.3 oz) 94.9 kg (209 lb 3.5 oz) 95.6 kg (210 lb 12.2 oz)    Scheduled Meds: . calcium carbonate (dosed in mg elemental calcium)  2,000 mg of elemental calcium Oral TID  . chlorhexidine gluconate (MEDLINE KIT)  15 mL Mouth Rinse BID  . darbepoetin (ARANESP) injection - DIALYSIS  200 mcg Intravenous Q Tue-HD  . feeding supplement (NEPRO CARB STEADY)  1,000 mL Per Tube Q24H  . feeding supplement (PRO-STAT SUGAR FREE 64)  60 mL Per Tube QID  . [name]'s butt cream   Topical BID  . heparin  5,000 Units Subcutaneous Q8H  . insulin aspart  0-15 Units Subcutaneous Q4H  . mouth rinse  15 mL Mouth Rinse QID  . metoprolol succinate  12.5 mg Oral QHS  . multivitamin  1 tablet Oral QHS  . pantoprazole sodium  40 mg Per Tube Q24H   Continuous Infusions: . sodium chloride 10 mL/hr at 03/03/17 1700  . sodium chloride 20 mL/hr at 03/05/17 2107  . sodium chloride    . sodium chloride    . sodium chloride    . sodium chloride    . cefTRIAXone (ROCEPHIN)  IV Stopped (03/05/17 1554)  . metronidazole Stopped (03/06/17 0546)   PRN Meds:.sodium chloride, sodium chloride, sodium chloride, sodium chloride, acetaminophen (TYLENOL) oral liquid 160 mg/5 mL, albuterol, alteplase, fentaNYL (SUBLIMAZE) injection, heparin, heparin, lidocaine (PF), lidocaine-prilocaine, metoprolol tartrate, pentafluoroprop-tetrafluoroeth  Current Labs: reviewed    Physical Exam:  Blood pressure 105/76, pulse (!) 113, temperature 98.7 F (37.1 C), temperature source Oral, resp. rate (!) 38, height 5' 8.5" (1.74 m), weight 95.6  kg (210 lb 12.2 oz), SpO2 100 %. Awake, alert; tracks Follows commands; shows me 2 fingers with both hands Tachy, regular, nl s1s2 Coarse bs b/l Trach with ATC NGT in place Feet in boots AVF +B/T  Dialysis Orders: Center: [facility]  On TueThuSat, 4 hrs 15 min, Optiflux 200 EDW 114 (kg), 2.0 K, 2.0 Ca, AVF LUA  Iron Sucrose (Venofer) 50 mg IVP During Dialysis 1X Week  Mircera 75 mcg IV q  2 weeks ( last on 02/05/17) Vitamin D (Calcitriol) Oral 0.75 mcg  Po q hd   A 1.  ESRD [facility] 2. Cardiac Arrest 5/19 with anoxic brain injury 3. VDRF s/p trach, weaning 4. Nonhealing L foot wound, amputation recommended, ortho and [facility] have followed; Sat meeting planned 5. HTN/Vol 6. DM2 7. NICM, hx/o S+D CHF 8. Anemia, max aranesp qTues; avoid Fe with infection 9. CKD BMD: P and Ca ok currently  P 1. Cont HD on THS Schedue, 4K bath; 3L UF, SBP > 90, tight heparin   Pearson Grippe MD 03/06/2017, 8:48 AM   Recent Labs Lab 03/03/17 0252 03/04/17 0430 03/05/17 0242  NA 136 133* 138  K 4.5 3.4* 3.5  CL 95* 92* 95*  CO2 23 27 25   GLUCOSE 157* 215* 173*  BUN 42* 56* 91*  CREATININE 3.11* 3.52* 4.75*  CALCIUM 8.4* 8.9 9.4  PHOS 2.7 2.8 3.3    Recent Labs Lab 03/03/17 0252 03/04/17 0430 03/05/17 0242  WBC  21.7* 24.6* 26.7*  NEUTROABS  --  20.0*  --   HGB 8.5* 8.9* 9.1*  HCT 28.3* 29.7* 30.2*  MCV 97.6 96.1 97.1  PLT 672* 705* 693*

## 2017-03-07 LAB — CBC
HEMATOCRIT: 28.5 % — AB (ref 36.0–46.0)
HEMOGLOBIN: 8.4 g/dL — AB (ref 12.0–15.0)
MCH: 28.7 pg (ref 26.0–34.0)
MCHC: 29.5 g/dL — AB (ref 30.0–36.0)
MCV: 97.3 fL (ref 78.0–100.0)
Platelets: 559 10*3/uL — ABNORMAL HIGH (ref 150–400)
RBC: 2.93 MIL/uL — AB (ref 3.87–5.11)
RDW: 17.9 % — ABNORMAL HIGH (ref 11.5–15.5)
WBC: 24.4 10*3/uL — AB (ref 4.0–10.5)

## 2017-03-07 LAB — GLUCOSE, CAPILLARY
GLUCOSE-CAPILLARY: 137 mg/dL — AB (ref 65–99)
GLUCOSE-CAPILLARY: 142 mg/dL — AB (ref 65–99)
GLUCOSE-CAPILLARY: 160 mg/dL — AB (ref 65–99)
GLUCOSE-CAPILLARY: 180 mg/dL — AB (ref 65–99)
Glucose-Capillary: 143 mg/dL — ABNORMAL HIGH (ref 65–99)
Glucose-Capillary: 155 mg/dL — ABNORMAL HIGH (ref 65–99)
Glucose-Capillary: 164 mg/dL — ABNORMAL HIGH (ref 65–99)

## 2017-03-07 LAB — RENAL FUNCTION PANEL
ALBUMIN: 2.2 g/dL — AB (ref 3.5–5.0)
ANION GAP: 16 — AB (ref 5–15)
BUN: 82 mg/dL — ABNORMAL HIGH (ref 6–20)
CALCIUM: 9.2 mg/dL (ref 8.9–10.3)
CO2: 26 mmol/L (ref 22–32)
Chloride: 98 mmol/L — ABNORMAL LOW (ref 101–111)
Creatinine, Ser: 5.14 mg/dL — ABNORMAL HIGH (ref 0.44–1.00)
GFR, EST AFRICAN AMERICAN: 11 mL/min — AB (ref >60)
GFR, EST NON AFRICAN AMERICAN: 10 mL/min — AB (ref >60)
GLUCOSE: 144 mg/dL — AB (ref 65–99)
Phosphorus: 3.9 mg/dL (ref 2.5–4.6)
Potassium: 3.1 mmol/L — ABNORMAL LOW (ref 3.5–5.1)
SODIUM: 140 mmol/L (ref 135–145)

## 2017-03-07 NOTE — Progress Notes (Addendum)
Admit: 02/14/2017 LOS: 21  49F ESRD [place] admitted with nonhealing L leg wound and s/p cardiac arrest 02/14/17  Subjective: on HD  06/08 0701 - 06/09 0700 In: 1150 [P.O.:360; I.V.:480; NG/GT:210; IV Piggyback:100] Out: 550 [Urine:150; Stool:400]  Filed Weights   03/06/17 0328 03/07/17 0344 03/07/17 0700  Weight: 95.6 kg (210 lb 12.2 oz) 92.8 kg (204 lb 9.4 oz) 92.8 kg (204 lb 9.4 oz)    Scheduled Meds: . calcium carbonate (dosed in mg elemental calcium)  2,000 mg of elemental calcium Oral TID  . chlorhexidine gluconate (MEDLINE KIT)  15 mL Mouth Rinse BID  . darbepoetin (ARANESP) injection - DIALYSIS  200 mcg Intravenous Q Tue-HD  . feeding supplement (NEPRO CARB STEADY)  1,000 mL Per Tube Q24H  . feeding supplement (PRO-STAT SUGAR FREE 64)  60 mL Per Tube QID  . [name]'s butt cream   Topical BID  . heparin  5,000 Units Subcutaneous Q8H  . insulin aspart  0-15 Units Subcutaneous Q4H  . mouth rinse  15 mL Mouth Rinse QID  . metoprolol succinate  12.5 mg Oral QHS  . multivitamin  1 tablet Oral QHS  . pantoprazole sodium  40 mg Per Tube Q24H   Continuous Infusions: . sodium chloride 10 mL/hr at 03/03/17 1700  . sodium chloride 20 mL/hr at 03/07/17 0413  . sodium chloride    . sodium chloride    . sodium chloride    . sodium chloride     PRN Meds:.sodium chloride, sodium chloride, sodium chloride, sodium chloride, acetaminophen (TYLENOL) oral liquid 160 mg/5 mL, albuterol, alteplase, fentaNYL (SUBLIMAZE) injection, heparin, heparin, lidocaine (PF), lidocaine-prilocaine, metoprolol tartrate, pentafluoroprop-tetrafluoroeth  Current Labs: reviewed    Physical Exam:  Blood pressure 97/71, pulse (!) 126, temperature 100 F (37.8 C), temperature source Axillary, resp. rate (!) 27, height 5' 8.5" (1.74 m), weight 92.8 kg (204 lb 9.4 oz), SpO2 100 %. Awake, alert; tracks Follows simple commands Tachy, regular, nl s1s2 Coarse bs b/l Trach with ATC NGT in place Feet in boots AVF  +B/T  Dialysis: [facility] TTS   4h 68mn  F200   114kg  2/2 bath  LUA AVF  Hep none - Iron Sucrose (Venofer) 50 mg IVP During Dialysis 1X Week  - Mircera 75 mcg IV q  2 weeks ( last on 02/05/17) - Vitamin D (Calcitriol) Oral 0.75 mcg  Po q hd   Assess 1. ESRD TTS [facility] 2. Cardiac Arrest 5/19 with anoxic brain injury 3. VDRF s/p trach, weaning 4. Nonhealing L foot wound, amputation recommended, seen by ortho and ID; Sat meeting planned 5. HTN/ vol - getting low dose MTP, has lost 20kg here; BP's good no vol excess on exam 6. DM2 7. NICM, hx/o S+D CHF 8. Anemia, max aranesp qTues; avoid Fe with infection 9. CKD BMD: P and Ca ok currently  P - HD today in ICU  RKelly SplinterMD CThe [facility] LLCpgr [phone]  03/07/2017, 10:48 AM       Recent Labs Lab 03/04/17 0430 03/05/17 0242 03/07/17 0726  NA 133* 138 140  K 3.4* 3.5 3.1*  CL 92* 95* 98*  CO2 27 25 26   GLUCOSE 215* 173* 144*  BUN 56* 91* 82*  CREATININE 3.52* 4.75* 5.14*  CALCIUM 8.9 9.4 9.2  PHOS 2.8 3.3 3.9    Recent Labs Lab 03/04/17 0430 03/05/17 0242 03/07/17 0725  WBC 24.6* 26.7* 24.4*  NEUTROABS 20.0*  --   --   HGB 8.9* 9.1* 8.4*  HCT 29.7* 30.2* 28.5*  MCV 96.1 97.1 97.3  PLT 705* 693* 559*

## 2017-03-07 NOTE — Progress Notes (Addendum)
PROGRESS NOTE    [name]  MRN:[id number] DOB: 11/13/1980 DOA: 02/14/2017 PCP: Anderson, Teresa, FNP   Brief Narrative: 36-year-old female end-stage renal disease on hemodialysis secondary to poorly controlled diabetes mellitus, bilateral transmetatarsal foot amputations, obesity, hypertension, medical noncompliance, nonischemic cardiomyopathy who was admitted 02/14/2017 with left leg pain and found to have osteomyelitis with wound nonhealing, dehiscence, purulent drainage from the wound. Patient placed empirically on IV antibiotics, orthopedics and nephrology consulted. Patient was found unresponsive in the room on 02/14/2017 after getting IV Dilaudid in the afternoon for leg pain. Patient had a 9+ minute asystole cardiac arrest was intubated and transferred to the ICU and underwent cooling and rewarming. Patient with no significant neurological improvement and was assessed by neurology who felt that given patient's current neurological status the meaningful neurological recovery was very dismal.  Patient also seen in consultation by ID and placed empirically on IV antibiotics which have been D escalated to IV Rocephin, Flagyl and vancomycin. Patient has been followed by nephrology and receiving hemodialysis during the hospitalization. Orthopedics consulted and patient seen by Dr. 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 It is noted that patient has a dry gangrene S with exposed bone. Recommendations from orthopedics was to proceed with a transtibial amputation which has been discussed multiple times in the office prior to admission. As per, orthopedics patient does not want to proceed with transtibial amputation and mother reinforcing patient's wishes at this time. Patient currently on empiric IV antibiotics. I assume patient's care since 03/04/2017.  Second Orthopedic opinion on 03/04/2017.  Assessment & Plan:  # acute respiratory  failure/vent dependent -Status post ATC.  -Continue trach care protocol. -PCCM following. Now on trach collar.  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  -Continue Toprol-XL. -on metoprolol for sinus tachycardia. Monitor HR  # end-stage renal disease on hemodialysis -Hemodialysis as per nephrologist. Monitor labs and blood pressure. Dialysis today.  # moderate protein calorie malnutrition -Patient currently getting tube feeds. IR was consulted for PEG tube placement however due to patient's leukocytosis and intermittent fevers IR holding off on PEG tube secondary to concern for seeding. -Follow up on IR and palliative care.  # anemia of chronic disease and chronic kidney disease: Monitor CBC. No sign of bleeding.  # left foot osteomyelitis/gangrene  -Patient has been seen in consultation by orthopedics, Dr. Duda who has recommended amputation however patient refusing amputation.  -Patient completed 10 days of IV antibiotics including Flagyl, vancomycin and ceftriaxone on 03/06/2017. Patient continued to have intermittent fever and has leukocytosis. As per infectious disease no need for long-term antibiotics since the definitive treatment is to do surgery. -TEE negative for endocarditis.  -Had a second opinion from orthopedics on 03/04/2017. As per Orthopedics note from 6/6, plan for meeting with the family on Saturday which is today to discuss further intervention including possible surgery/amputation. Will follow up orthopedic's plan.   # acute metabolic encephalopathy/anoxic encephalopathy post cardiac arrest -opens eyes and following simple commands by moving her head. Mentally status is unchanged  #  diabetes mellitus type 2 with neuropathy -Continue sliding scale. Monitor blood sugar level.  # leukocytosis and intermittent fever:  due to dry gangrene. Evaluated by infectious disease. Recommended no long-term antibiotics since the treatment his   only surgical amputation.  -Minimize blood draws for this critically ill patient.  #Goals of care: Discussion ongoing with palliative care team. Patient has poor prognosis. Palliative care appreciated.  # Diarrhea: check stool c diff. The stool c diff negative. Patient has a rectal tube. Loses stool likely  due to tube feeding as well. 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    Consultants:   Infectious disease: Dr. Snyder 02/24/2017  Neurology Dr.Lindzen 02/24/2017  Gastroenterology: Dr. Magod 02/19/2017  Wound care  PCCM: Dr Mannam 02/14/2017  Nephrology: Dr.Schertz 02/14/2017  Orthopedics: Dr. Whitfield 02/14/2017  Orthopedics: Dr.  Duda  Orthopedics second opinion consult by Dr Blackman on 03/04/2017   Procedures:  CT head 02/14/2017  CT abdomen and pelvis 03/01/2017  Right upper quadrant ultrasound 02/18/2017  TEE 03/02/2017  MRI brain 02/18/2017  Lower extremity Dopplers 02/13/2017 negative for DVT  EEG 02/15/2017>>>> findings consistent with moderate to severe global cerebral dysfunction. Consistent with anoxic injury given clinical course Antimicrobials: Clinidamycin 5/19 >> 5/21 Vancomycin 5/19 >> 5/25 >> restart 5/30....6/8 Zosyn 5/19 >> 5/22 Meropenem 5/22 >> 5/30 Flagyl 5/30 >>6/8 Ceftriaxone 5/30 >>6/8 Subjective: Seen and examined at bedside. Mother at bedside. No new event. Receiving bedside hemodialysis treatment. Objective: Vitals:   03/07/17 0915 03/07/17 0930 03/07/17 1000 03/07/17 1015  BP: 106/79 107/76 106/82 109/73  Pulse: (!) 124 (!) 122 (!) 120 (!) 125  Resp: (!) 29 (!) 26 (!) 22 (!) 26  Temp:      TempSrc:      SpO2: 100% 100% 100% 100%  Weight:      Height:        Intake/Output Summary (Last 24 hours) at 03/07/17 1035 Last data filed at 03/07/17 0600  Gross per 24 hour  Intake              790 ml  Output              550 ml  Net              240 ml   Filed Weights   03/06/17 0328 03/07/17 0344 03/07/17 0700  Weight: 95.6 kg (210 lb 12.2 oz) 92.8 kg (204 lb 9.4 oz) 92.8 kg (204 lb 9.4 oz)    Examination:  General exam:Ill-looking female with tracheostomy lying in bed, not in distress. Critical condition unchanged from yesterday Neck: Tracheostomy site looks clean Respiratory system: Coarse breath sound bilateral, no wheezing Cardiovascular system: S1 & S2 heard, regular tachycardic.  No pedal   edema. Gastrointestinal system: Abdomen is nondistended, soft and nontender. Normal bowel sounds heard. Central nervous system: Opening her eyes and following simple commands by moving her head, unchanged Extremities: Unable to assess. Skin: No rashes, lesions or  ulcers Psychiatry: Unable to assess.     Data Reviewed: I have personally reviewed following labs and imaging studies  CBC:  Recent Labs Lab 03/02/17 0239 03/03/17 0252 03/04/17 0430 03/05/17 0242 03/07/17 0725  WBC 22.4* 21.7* 24.6* 26.7* 24.4*  NEUTROABS  --   --  20.0*  --   --   HGB 8.5* 8.5* 8.9* 9.1* 8.4*  HCT 28.2* 28.3* 29.7* 30.2* 28.5*  MCV 96.2 97.6 96.1 97.1 97.3  PLT 732* 672* 705* 693* 559*   Basic Metabolic Panel:  Recent Labs Lab 03/02/17 2357 03/03/17 0252 03/04/17 0430 03/05/17 0242 03/07/17 0726  NA 134* 136 133* 138 140  K 4.0 4.5 3.4* 3.5 3.1*  CL 96* 95* 92* 95* 98*  CO2 26 23 27 25 26  GLUCOSE 191* 157* 215* 173* 144*  BUN 42* 42* 56* 91* 82*  CREATININE 3.12* 3.11* 3.52* 4.75* 5.14*  CALCIUM 8.3* 8.4* 8.9 9.4 9.2  MG 1.9 1.9  --   --   --   PHOS 2.6 2.7 2.8 3.3 3.9   GFR: Estimated Creatinine Clearance: 18 mL/min (A) (by C-G formula based on SCr of 5.14 mg/dL (H)). Liver Function Tests:  Recent Labs Lab 03/02/17 0239 03/03/17 0252 03/04/17 0430 03/05/17 0242 03/07/17 0726  ALBUMIN 1.9* 2.0* 2.1* 2.1* 2.2*   No results for input(s): LIPASE, AMYLASE in the last 168 hours. No results for input(s): AMMONIA in the last 168 hours. Coagulation Profile: No results for input(s): INR, PROTIME in the last 168 hours. Cardiac Enzymes: No results for input(s): CKTOTAL, CKMB, CKMBINDEX, TROPONINI in the last 168 hours. BNP (last 3 results) No results for input(s): PROBNP in the last 8760 hours. HbA1C: No results for input(s): HGBA1C in the last 72 hours. CBG:  Recent Labs Lab 03/06/17 1655 03/06/17 2007 03/07/17 0012 03/07/17 0342 03/07/17 0813  GLUCAP 165* 169* 180* 137* 142*   Lipid Profile: No results for input(s): CHOL, HDL, LDLCALC, TRIG, CHOLHDL, LDLDIRECT in the last 72 hours. Thyroid Function Tests: No results for input(s): TSH, T4TOTAL, FREET4, T3FREE, THYROIDAB in the last 72 hours. Anemia Panel: No results for  input(s): VITAMINB12, FOLATE, FERRITIN, TIBC, IRON, RETICCTPCT in the last 72 hours. Sepsis Labs: No results for input(s): PROCALCITON, LATICACIDVEN in the last 168 hours.  Recent Results (from the past 240 hour(s))  C difficile quick scan w PCR reflex     Status: None   Collection Time: 02/27/17  9:41 AM  Result Value Ref Range Status   C Diff antigen NEGATIVE NEGATIVE Final   C Diff toxin NEGATIVE NEGATIVE Final   C Diff interpretation No C. difficile detected.  Final  Culture, blood (Routine X 2) w Reflex to ID Panel     Status: None   Collection Time: 02/27/17 10:39 AM  Result Value Ref Range Status   Specimen Description BLOOD RIGHT HAND  Final   Special Requests IN PEDIATRIC BOTTLE Blood Culture adequate volume  Final   Culture NO GROWTH 5 DAYS  Final   Report Status 03/04/2017 FINAL  Final  Culture, blood (Routine X 2) w Reflex to ID Panel     Status: None   Collection Time: 02/27/17 10:49 AM  Result Value Ref Range Status   Specimen Description BLOOD RIGHT HAND  Final     Special Requests IN PEDIATRIC BOTTLE Blood Culture adequate volume  Final   Culture NO GROWTH 5 DAYS  Final   Report Status 03/04/2017 FINAL  Final  C difficile quick scan w PCR reflex     Status: None   Collection Time: 03/04/17  2:17 PM  Result Value Ref Range Status   C Diff antigen NEGATIVE NEGATIVE Final   C Diff toxin NEGATIVE NEGATIVE Final   C Diff interpretation No C. difficile detected.  Final         Radiology Studies: No results found.      Scheduled Meds: . calcium carbonate (dosed in mg elemental calcium)  2,000 mg of elemental calcium Oral TID  . chlorhexidine gluconate (MEDLINE KIT)  15 mL Mouth Rinse BID  . darbepoetin (ARANESP) injection - DIALYSIS  200 mcg Intravenous Q Tue-HD  . feeding supplement (NEPRO CARB STEADY)  1,000 mL Per Tube Q24H  . feeding supplement (PRO-STAT SUGAR FREE 64)  60 mL Per Tube QID  . [name]'s butt cream   Topical BID  . heparin  5,000 Units  Subcutaneous Q8H  . insulin aspart  0-15 Units Subcutaneous Q4H  . mouth rinse  15 mL Mouth Rinse QID  . metoprolol succinate  12.5 mg Oral QHS  . multivitamin  1 tablet Oral QHS  . pantoprazole sodium  40 mg Per Tube Q24H   Continuous Infusions: . sodium chloride 10 mL/hr at 03/03/17 1700  . sodium chloride 20 mL/hr at 03/07/17 0413  . sodium chloride    . sodium chloride    . sodium chloride    . sodium chloride       LOS: 21 days    Dron Prasad Bhandari, MD [facility] Pager [phone]  If 7PM-7AM, please contact night-coverage [email or web address] Password [email or web address] 03/07/2017, 10:35 AM   

## 2017-03-07 NOTE — Progress Notes (Signed)
Removed sutures to trach

## 2017-03-08 LAB — GLUCOSE, CAPILLARY
GLUCOSE-CAPILLARY: 166 mg/dL — AB (ref 65–99)
GLUCOSE-CAPILLARY: 175 mg/dL — AB (ref 65–99)
GLUCOSE-CAPILLARY: 157 mg/dL — AB (ref 65–99)
GLUCOSE-CAPILLARY: 174 mg/dL — AB (ref 65–99)
Glucose-Capillary: 177 mg/dL — ABNORMAL HIGH (ref 65–99)

## 2017-03-08 MED ORDER — METOPROLOL SUCCINATE ER 25 MG PO TB24
25.0000 mg | ORAL_TABLET | Freq: Every day | ORAL | Status: DC
  Administered 2017-03-08 – 2017-03-11 (×4): 25 mg via ORAL
  Filled 2017-03-08 (×4): qty 1

## 2017-03-08 MED ORDER — METOPROLOL TARTRATE 12.5 MG HALF TABLET
12.5000 mg | ORAL_TABLET | ORAL | Status: AC
  Administered 2017-03-08: 12.5 mg via ORAL
  Filled 2017-03-08: qty 1

## 2017-03-08 NOTE — Progress Notes (Signed)
Admit: 02/14/2017 LOS: 47  65F ESRD [place] admitted with nonhealing L leg wound and s/p cardiac arrest 02/14/17  Subjective: awake but not responding  06/09 0701 - 06/10 0700 In: 735 [I.V.:390; NG/GT:345] Out: 2675   Filed Weights   03/07/17 0344 03/07/17 0700 03/08/17 0452  Weight: 92.8 kg (204 lb 9.4 oz) 92.8 kg (204 lb 9.4 oz) 95.7 kg (211 lb)    Scheduled Meds: . calcium carbonate (dosed in mg elemental calcium)  2,000 mg of elemental calcium Oral TID  . chlorhexidine gluconate (MEDLINE KIT)  15 mL Mouth Rinse BID  . darbepoetin (ARANESP) injection - DIALYSIS  200 mcg Intravenous Q Tue-HD  . feeding supplement (NEPRO CARB STEADY)  1,000 mL Per Tube Q24H  . feeding supplement (PRO-STAT SUGAR FREE 64)  60 mL Per Tube QID  . [name]'s butt cream   Topical BID  . heparin  5,000 Units Subcutaneous Q8H  . insulin aspart  0-15 Units Subcutaneous Q4H  . mouth rinse  15 mL Mouth Rinse QID  . metoprolol succinate  12.5 mg Oral QHS  . multivitamin  1 tablet Oral QHS  . pantoprazole sodium  40 mg Per Tube Q24H   Continuous Infusions: . sodium chloride 10 mL/hr at 03/03/17 1700  . sodium chloride 20 mL/hr at 03/07/17 0413  . sodium chloride    . sodium chloride    . sodium chloride    . sodium chloride     PRN Meds:.sodium chloride, sodium chloride, sodium chloride, sodium chloride, acetaminophen (TYLENOL) oral liquid 160 mg/5 mL, albuterol, alteplase, fentaNYL (SUBLIMAZE) injection, heparin, heparin, lidocaine (PF), lidocaine-prilocaine, metoprolol tartrate, pentafluoroprop-tetrafluoroeth  Current Labs: reviewed    Physical Exam:  Blood pressure (!) 139/91, pulse (!) 124, temperature 100.1 F (37.8 C), temperature source Axillary, resp. rate (!) 32, height 5' 8.5" (1.74 m), weight 95.7 kg (211 lb), SpO2 100 %. Awake, not following commands Tachy, regular, nl s1s2 Coarse bs b/l Trach with ATC NGT in place Feet in boots No LEE AVF +B/T  Dialysis: [facility] TTS   4h 30mn  F200    114kg  2/2 bath  LUA AVF  Hep none - Iron Sucrose (Venofer) 50 mg IVP During Dialysis 1X Week  - Mircera 75 mcg IV q  2 weeks ( last on 02/05/17) - Vitamin D (Calcitriol) Oral 0.75 mcg  Po q hd   Assess 1. ESRD TTS [facility] 2. Cardiac Arrest 5/19 with anoxic brain injury 3. VDRF s/p trach, weaning 4. Nonhealing L foot wound - amputation recommended, seen by ortho and ID 5. HTN/ vol - getting low dose MTP, has lost 20kg here; BP's good and no vol excess on exam 6. DM2 7. NICM, hx/o S+D CHF 8. Anemia, max aranesp qTues; avoid Fe with infection 9. CKD MBD: P and Ca ok currently  P - HD tuesday  RKelly SplinterMD CIntracoastal Surgery Center LLCpgr [phone]  03/08/2017, 10:33 AM       Recent Labs Lab 03/04/17 0430 03/05/17 0242 03/07/17 0726  NA 133* 138 140  K 3.4* 3.5 3.1*  CL 92* 95* 98*  CO2 27 25 26   GLUCOSE 215* 173* 144*  BUN 56* 91* 82*  CREATININE 3.52* 4.75* 5.14*  CALCIUM 8.9 9.4 9.2  PHOS 2.8 3.3 3.9    Recent Labs Lab 03/04/17 0430 03/05/17 0242 03/07/17 0725  WBC 24.6* 26.7* 24.4*  NEUTROABS 20.0*  --   --   HGB 8.9* 9.1* 8.4*  HCT 29.7* 30.2* 28.5*  MCV 96.1  97.1 97.3  PLT 705* 693* 559*

## 2017-03-08 NOTE — Progress Notes (Signed)
PROGRESS NOTE    [name]  EQA:[id number] DOB: November 29, 1980 DOA: 02/14/2017 PCP: Vicenta Aly, FNP   Brief Narrative: 36 year old female end-stage renal disease on hemodialysis secondary to poorly controlled diabetes mellitus, bilateral transmetatarsal foot amputations, obesity, hypertension, medical noncompliance, nonischemic cardiomyopathy who was admitted 02/14/2017 with left leg pain and found to have osteomyelitis with wound nonhealing, dehiscence, purulent drainage from the wound. Patient placed empirically on IV antibiotics, orthopedics and nephrology consulted. Patient was found unresponsive in the room on 02/14/2017 after getting IV Dilaudid in the afternoon for leg pain. Patient had a 9+ minute asystole cardiac arrest was intubated and transferred to the ICU and underwent cooling and rewarming. Patient with no significant neurological improvement and was assessed by neurology who felt that given patient's current neurological status the meaningful neurological recovery was very dismal.  Patient also seen in consultation by ID and placed empirically on IV antibiotics which have been D escalated to IV Rocephin, Flagyl and vancomycin. Patient has been followed by nephrology and receiving hemodialysis during the hospitalization. Orthopedics consulted and patient seen by Dr. Sharol Given and is noted that patient has a dry gangrene involving the heel and Achilles of her left foot. It is noted that patient has a dry gangrene S with exposed bone. Recommendations from orthopedics was to proceed with a transtibial amputation which has been discussed multiple times in the office prior to admission. As per, orthopedics patient does not want to proceed with transtibial amputation and mother reinforcing patient's wishes at this time. Patient currently on empiric IV antibiotics. I assume patient's care since 03/04/2017.  Second Orthopedic opinion on 03/04/2017.  Assessment & Plan:  # acute respiratory  failure/vent dependent -Status post ATC.  -Continue trach care protocol. -PCCM following. Now on trach collar.  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  -Continue Toprol-XL. -I increased the dose of metoprolol to 25 mg because of tachycardia. Monitor HR and blood pressure  # end-stage renal disease on hemodialysis -Hemodialysis as per nephrologist. Monitor labs and blood pressure.   # moderate protein calorie malnutrition -Patient currently getting tube feeds. IR was consulted for PEG tube placement however due to patient's leukocytosis and intermittent fevers IR holding off on PEG tube secondary to concern for seeding. -Follow up on IR and palliative care.  # anemia of chronic disease and chronic kidney disease: Monitor CBC. No sign of bleeding.  # left foot osteomyelitis/gangrene  -Patient has been seen in consultation by orthopedics, Dr. Sharol Given who has recommended amputation however patient refusing amputation.  -Patient completed 10 days of IV antibiotics including Flagyl, vancomycin and ceftriaxone on 03/06/2017. Patient continued to have intermittent fever and has leukocytosis. As per infectious disease no need for long-term antibiotics since the definitive treatment is to do surgery. -TEE negative for endocarditis.  -Had a second opinion from orthopedics on 03/04/2017. As per Orthopedics note from 6/6, plan for meeting with the family on Saturday. As per family member, they did not discuss with orthopedics yesterday. I discussed with orthopedics on-call doctor Dr. Louanne Skye and explained the situation. Follow-up final plan from orthopedics regarding left foot gangrene. For now continue to provide supportive  care.  # acute metabolic encephalopathy/anoxic encephalopathy post cardiac arrest -opens eyes and following simple commands by moving her head. Mentally status is unchanged  # diabetes mellitus type 2  with neuropathy -Continue sliding scale. Monitor blood sugar level.  # leukocytosis and intermittent fever:  due to dry gangrene. Evaluated by infectious disease. Recommended no long-term antibiotics since the treatment his only surgical amputation.  -Minimize blood draws for this critically ill patient.  #Goals of care: Discussion ongoing with palliative care team. Patient has poor prognosis. Palliative care appreciated.  # Diarrhea: check stool c diff. The stool c diff negative. Patient has a rectal tube. Loses stool likely  due to tube feeding as well. 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)   Diabetic osteomyelitis ([place])   Dehiscence of amputation stump (HCC)   Osteomyelitis (HCC)   Chronic pain syndrome   Cardiac arrest, cause unspecified ([place])   Pressure injury of skin   Wound dehiscence   Coma ([place])   Elevated liver enzymes   Jaundice   Encounter for central line placement   Protein calorie malnutrition ([place])   DNR (do not resuscitate) discussion   Palliative care by specialist   Tachycardia  DVT prophylaxis: Heparin subcutaneous Code Status: Full code Family Communication: Discussed with the patient's mother at bedside Disposition Plan: Transfer to stepdown unit..    Consultants:   Infectious disease: Dr. Graylon Good 02/24/2017  Neurology Dr.Lindzen  02/24/2017  Gastroenterology: Dr. Watt Climes 02/19/2017  Wound care  PCCM: Dr Vaughan Browner 02/14/2017  Nephrology: Dr.Schertz 02/14/2017  Orthopedics: Dr. Durward Fortes 02/14/2017  Orthopedics: Dr. Sharol Given  Orthopedics second opinion consult by Dr Ninfa Linden on 03/04/2017   Procedures:  CT head 02/14/2017  CT abdomen and pelvis 03/01/2017  Right upper quadrant ultrasound 02/18/2017  TEE 03/02/2017  MRI brain 02/18/2017  Lower extremity Dopplers 02/13/2017 negative for DVT  EEG 02/15/2017>>>> findings consistent with moderate to severe global cerebral dysfunction. Consistent with anoxic injury given clinical course Antimicrobials: Clinidamycin 5/19 >> 5/21 Vancomycin 5/19 >> 5/25 >> restart 5/30.Marland KitchenMarland KitchenMarland Kitchen6/8 Zosyn 5/19 >> 5/22 Meropenem 5/22 >> 5/30 Flagyl 5/30 >>6/8 Ceftriaxone 5/30 >>6/8 Subjective: Seen and examined at bedside. No change in clinical status. The patient's sister at bedside. Discussed with the plan of care. Also discussed with the patient's nurse. Objective: Vitals:   03/08/17 0452 03/08/17 0749 03/08/17 0751 03/08/17 0808  BP:  (!) 132/94  (!) 139/91  Pulse:  (!) 124    Resp:  (!) 32    Temp:    100.1 F (37.8 C)  TempSrc:    Axillary  SpO2:  99% 100%   Weight: 95.7 kg (211 lb)     Height:        Intake/Output Summary (Last 24 hours) at 03/08/17 1045 Last data filed at 03/08/17 0600  Gross per 24 hour  Intake              630 ml  Output             2675 ml  Net            -2045 ml   Filed Weights   03/07/17 0344 03/07/17 0700 03/08/17 0452  Weight: 92.8 kg (204 lb 9.4 oz) 92.8 kg (204 lb 9.4 oz) 95.7 kg (211 lb)    Examination:  General exam:Ill-looking female with tracheostomy lying in bed, not in distress. The condition unchanged from yesterday Neck: Tracheostomy site looks clean Respiratory system: Good bilateral,nowheezing Cardiovascular system: Regular tachycardic S1 and S2 heard.  No pedal edema. Gastrointestinal system: Abdomen is nondistended,  soft and nontender. Normal bowel sounds heard. Central nervous system: Opening her eyes and following simple commands by moving her head, unchanged  Extremities: Unable to assess. Skin: No rashes, lesions or ulcers Psychiatry: Unable to assess.     Data Reviewed: I have personally reviewed following labs and imaging studies  CBC:  Recent Labs Lab 03/02/17 0239 03/03/17 0252 03/04/17 0430 03/05/17 0242 03/07/17 0725  WBC 22.4* 21.7* 24.6* 26.7* 24.4*  NEUTROABS  --   --  20.0*  --   --   HGB 8.5* 8.5* 8.9* 9.1* 8.4*  HCT 28.2* 28.3* 29.7* 30.2* 28.5*  MCV 96.2 97.6 96.1 97.1 97.3  PLT 732* 672* 705* 693* 786*   Basic Metabolic Panel:  Recent Labs Lab 03/02/17 2357 03/03/17 0252 03/04/17 0430 03/05/17 0242 03/07/17 0726  NA 134* 136 133* 138 140  K 4.0 4.5 3.4* 3.5 3.1*  CL 96* 95* 92* 95* 98*  CO2 _0 GLUCOSE 191* 157* 215* 173* 144*  BUN 42* 42* 56* 91* 82*  CREATININE 3.12* 3.11* 3.52* 4.75* 5.14*  CALCIUM 8.3* 8.4* 8.9 9.4 9.2  MG 1.9 1.9  --   --   --   PHOS 2.6 2.7 2.8 3.3 3.9   GFR: Estimated Creatinine Clearance: 18.3 mL/min (A) (by C-G formula based on SCr of 5.14 mg/dL (H)). Liver Function Tests:  Recent Labs Lab 03/02/17 0239 03/03/17 0252 03/04/17 0430 03/05/17 0242 03/07/17 0726  ALBUMIN 1.9* 2.0* 2.1* 2.1* 2.2*   No results for input(s): LIPASE, AMYLASE in the last 168 hours. No results for input(s): AMMONIA in the last 168 hours. Coagulation Profile: No results for input(s): INR, PROTIME in the last 168 hours. Cardiac Enzymes: No results for input(s): CKTOTAL, CKMB, CKMBINDEX, TROPONINI in the last 168 hours. BNP (last 3 results) No results for input(s): PROBNP in the last 8760 hours. HbA1C: No results for input(s): HGBA1C in the last 72 hours. CBG:  Recent Labs Lab 03/07/17 1648 03/07/17 1959 03/07/17 2336 03/08/17 0419 03/08/17 0815  GLUCAP 160* 155* 164* 177* 166*   Lipid Profile: No results for input(s):  CHOL, HDL, LDLCALC, TRIG, CHOLHDL, LDLDIRECT in the last 72 hours. Thyroid Function Tests: No results for input(s): TSH, T4TOTAL, FREET4, T3FREE, THYROIDAB in the last 72 hours. Anemia Panel: No results for input(s): VITAMINB12, FOLATE, FERRITIN, TIBC, IRON, RETICCTPCT in the last 72 hours. Sepsis Labs: No results for input(s): PROCALCITON, LATICACIDVEN in the last 168 hours.  Recent Results (from the past 240 hour(s))  C difficile quick scan w PCR reflex     Status: None   Collection Time: 02/27/17  9:41 AM  Result Value Ref Range Status   C Diff antigen NEGATIVE NEGATIVE Final   C Diff toxin NEGATIVE NEGATIVE Final   C Diff interpretation No C. difficile detected.  Final  Culture, blood (Routine X 2) w Reflex to ID Panel     Status: None   Collection Time: 02/27/17 10:39 AM  Result Value Ref Range Status   Specimen Description BLOOD RIGHT HAND  Final   Special Requests IN PEDIATRIC BOTTLE Blood Culture adequate volume  Final   Culture NO GROWTH 5 DAYS  Final   Report Status 03/04/2017 FINAL  Final  Culture, blood (Routine  X 2) w Reflex to ID Panel     Status: None   Collection Time: 02/27/17 10:49 AM  Result Value Ref Range Status   Specimen Description BLOOD RIGHT HAND  Final   Special Requests IN PEDIATRIC BOTTLE Blood Culture adequate volume  Final   Culture NO GROWTH 5 DAYS  Final   Report Status 03/04/2017 FINAL  Final  C difficile quick scan w PCR reflex     Status: None   Collection Time: 03/04/17  2:17 PM  Result Value Ref Range Status   C Diff antigen NEGATIVE NEGATIVE Final   C Diff toxin NEGATIVE NEGATIVE Final   C Diff interpretation No C. difficile detected.  Final         Radiology Studies: No results found.      Scheduled Meds: . calcium carbonate (dosed in mg elemental calcium)  2,000 mg of elemental calcium Oral TID  . chlorhexidine gluconate (MEDLINE KIT)  15 mL Mouth Rinse BID  . darbepoetin (ARANESP) injection - DIALYSIS  200 mcg Intravenous Q  Tue-HD  . feeding supplement (NEPRO CARB STEADY)  1,000 mL Per Tube Q24H  . feeding supplement (PRO-STAT SUGAR FREE 64)  60 mL Per Tube QID  . [name]'s butt cream   Topical BID  . heparin  5,000 Units Subcutaneous Q8H  . insulin aspart  0-15 Units Subcutaneous Q4H  . mouth rinse  15 mL Mouth Rinse QID  . metoprolol succinate  25 mg Oral QHS  . multivitamin  1 tablet Oral QHS  . pantoprazole sodium  40 mg Per Tube Q24H   Continuous Infusions: . sodium chloride 10 mL/hr at 03/03/17 1700  . sodium chloride 20 mL/hr at 03/07/17 0413  . sodium chloride    . sodium chloride    . sodium chloride    . sodium chloride       LOS: 22 days    Dron Tanna Furry, MD [facility] Pager [phone]  If 7PM-7AM, please contact night-coverage [email or web address] Password [email or web address] 03/08/2017, 10:45 AM

## 2017-03-09 LAB — GLUCOSE, CAPILLARY
GLUCOSE-CAPILLARY: 182 mg/dL — AB (ref 65–99)
GLUCOSE-CAPILLARY: 188 mg/dL — AB (ref 65–99)
GLUCOSE-CAPILLARY: 191 mg/dL — AB (ref 65–99)
GLUCOSE-CAPILLARY: 232 mg/dL — AB (ref 65–99)
Glucose-Capillary: 183 mg/dL — ABNORMAL HIGH (ref 65–99)
Glucose-Capillary: 180 mg/dL — ABNORMAL HIGH (ref 65–99)

## 2017-03-09 MED ORDER — CALCIUM CARBONATE ANTACID 1250 MG/5ML PO SUSP
2000.0000 mg | Freq: Three times a day (TID) | ORAL | Status: DC
  Administered 2017-03-09 – 2017-03-11 (×5): 2000 mg via ORAL
  Filled 2017-03-09: qty 10
  Filled 2017-03-09 (×2): qty 20
  Filled 2017-03-09: qty 10
  Filled 2017-03-09: qty 20
  Filled 2017-03-09: qty 10

## 2017-03-09 MED ORDER — METOPROLOL TARTRATE 5 MG/5ML IV SOLN: 5.0000 mg | INTRAVENOUS | Status: DC | PRN

## 2017-03-09 MED ORDER — GERHARDT'S BUTT CREAM
TOPICAL_CREAM | Freq: Four times a day (QID) | CUTANEOUS | Status: DC
  Administered 2017-03-09 (×2): 1 via TOPICAL
  Administered 2017-03-09 – 2017-03-11 (×5): via TOPICAL
  Administered 2017-03-11 – 2017-03-13 (×3): 1 via TOPICAL
  Administered 2017-03-13: 19:00:00 via TOPICAL
  Administered 2017-03-13: 1 via TOPICAL
  Administered 2017-03-13: via TOPICAL
  Administered 2017-03-13 – 2017-03-15 (×5): 1 via TOPICAL
  Administered 2017-03-15: 14:00:00 via TOPICAL
  Administered 2017-03-15 (×2): 1 via TOPICAL
  Administered 2017-03-16: 14:00:00 via TOPICAL
  Administered 2017-03-16: 1 via TOPICAL
  Administered 2017-03-16 – 2017-03-21 (×18): via TOPICAL
  Administered 2017-03-21 – 2017-03-22 (×2): 1 via TOPICAL
  Administered 2017-03-22 (×4): via TOPICAL
  Administered 2017-03-23: 1 via TOPICAL
  Administered 2017-03-23 (×2): via TOPICAL
  Administered 2017-03-23: 1 via TOPICAL
  Administered 2017-03-24 – 2017-03-25 (×5): via TOPICAL
  Administered 2017-03-25: 1 via TOPICAL
  Administered 2017-03-25 – 2017-03-26 (×2): via TOPICAL
  Administered 2017-03-26: 1 via TOPICAL
  Administered 2017-03-26 – 2017-03-29 (×13): via TOPICAL
  Administered 2017-03-29: 1 via TOPICAL
  Administered 2017-03-30: 21:00:00 via TOPICAL
  Administered 2017-03-30: 1 via TOPICAL
  Administered 2017-03-30: 16:00:00 via TOPICAL
  Administered 2017-04-01: 1 via TOPICAL
  Administered 2017-04-01 – 2017-04-04 (×11): via TOPICAL
  Administered 2017-04-05: 1 via TOPICAL
  Administered 2017-04-05: 22:00:00 via TOPICAL
  Administered 2017-04-05: 1 via TOPICAL
  Administered 2017-04-06 – 2017-04-08 (×9): via TOPICAL
  Administered 2017-04-08: 1 via TOPICAL
  Administered 2017-04-09 – 2017-04-11 (×8): via TOPICAL
  Administered 2017-04-11: 1 via TOPICAL
  Administered 2017-04-12 (×3): via TOPICAL
  Administered 2017-04-12: 1 via TOPICAL
  Administered 2017-04-13 (×2): via TOPICAL
  Administered 2017-04-13: 1 via TOPICAL
  Administered 2017-04-13 – 2017-04-14 (×3): via TOPICAL
  Administered 2017-04-14: 1 via TOPICAL
  Administered 2017-04-15 (×3): via TOPICAL
  Administered 2017-04-16: 1 via TOPICAL
  Administered 2017-04-16: 21:00:00 via TOPICAL
  Administered 2017-04-16: 1 via TOPICAL
  Administered 2017-04-17 – 2017-04-18 (×4): via TOPICAL
  Administered 2017-04-18 (×2): 1 via TOPICAL
  Administered 2017-04-19: 13:00:00 via TOPICAL
  Administered 2017-04-19: 1 via TOPICAL
  Administered 2017-04-20 – 2017-04-21 (×5): via TOPICAL
  Administered 2017-04-21: 1 via TOPICAL
  Administered 2017-04-21: 22:00:00 via TOPICAL
  Administered 2017-04-22 (×2): 1 via TOPICAL
  Administered 2017-04-22: 22:00:00 via TOPICAL
  Administered 2017-04-22 – 2017-04-23 (×2): 1 via TOPICAL
  Administered 2017-04-23 – 2017-04-24 (×2): via TOPICAL
  Administered 2017-04-24: 1 via TOPICAL
  Administered 2017-04-25 – 2017-04-26 (×4): via TOPICAL
  Administered 2017-04-27: 1 via TOPICAL
  Administered 2017-04-27: 21:00:00 via TOPICAL
  Administered 2017-04-27: 1 via TOPICAL
  Administered 2017-04-27: 11:00:00 via TOPICAL
  Administered 2017-04-28 (×2): 1 via TOPICAL
  Administered 2017-04-28: 22:00:00 via TOPICAL
  Administered 2017-04-28: 1 via TOPICAL
  Administered 2017-04-29 (×4): via TOPICAL
  Administered 2017-04-30: 1 via TOPICAL
  Administered 2017-04-30: 14:00:00 via TOPICAL
  Filled 2017-03-09 (×11): qty 1

## 2017-03-09 MED ORDER — METOPROLOL TARTRATE 5 MG/5ML IV SOLN
5.0000 mg | INTRAVENOUS | Status: DC | PRN
  Administered 2017-03-09 – 2017-03-18 (×6): 5 mg via INTRAVENOUS
  Filled 2017-03-09 (×6): qty 5

## 2017-03-09 NOTE — Progress Notes (Signed)
SLP Cancellation Note  Patient Details Name: [name] MRN: [id number] DOB: [birth year]   Cancelled treatment:       Reason Eval/Treat Not Completed: Medical issues which prohibited therapy. Pt on the ventilator.    Elijiah Mickley, [name] 03/09/2017, 1:00 PM

## 2017-03-09 NOTE — Progress Notes (Signed)
CSW continuing to follow and look into SNF placement options if appropriate  Call made to [facility] concerning vent/trach/dialysis unit-left message  [facility]- their vent dialysis unit will not be open for 1-6months   Burna Sis, LCSW Clinical Social Worker [phone]

## 2017-03-09 NOTE — Progress Notes (Signed)
[facility] Progress Note   Subjective: Awake on vent. Not following commands. Mother at bedside.   Objective Vitals:   03/09/17 0412 03/09/17 0418 03/09/17 0500 03/09/17 0727  BP: 125/84   117/78  Pulse: (!) 127   (!) 126  Resp: (!) 21   (!) 21  Temp: 98.5 F (36.9 C)     TempSrc: Oral     SpO2: 98% 99%  100%  Weight:   95.7 kg (210 lb 15.7 oz)   Height:       Physical Exam General: chronically ill appearing female in NAD. Eyes open, not tracking objects.  Heart: N4,B0, 2/^ systolic M. No JVD. HR 120s Lungs: trach to vent bilateral breath sounds-few coarse upper airway BS.  Abdomen: active BS Feeding tube present. Extremities: Bilateral heel protectors. No LE edema.  Dialysis Access: LUA AVF + bruit faint in arterial portion of AVF   Additional Objective Labs: Basic Metabolic Panel:  Recent Labs Lab 03/04/17 0430 03/05/17 0242 03/07/17 0726  NA 133* 138 140  K 3.4* 3.5 3.1*  CL 92* 95* 98*  CO2 _0 GLUCOSE 215* 173* 144*  BUN 56* 91* 82*  CREATININE 3.52* 4.75* 5.14*  CALCIUM 8.9 9.4 9.2  PHOS 2.8 3.3 3.9   Liver Function Tests:  Recent Labs Lab 03/04/17 0430 03/05/17 0242 03/07/17 0726  ALBUMIN 2.1* 2.1* 2.2*   No results for input(s): LIPASE, AMYLASE in the last 168 hours. CBC:  Recent Labs Lab 03/03/17 0252 03/04/17 0430 03/05/17 0242 03/07/17 0725  WBC 21.7* 24.6* 26.7* 24.4*  NEUTROABS  --  20.0*  --   --   HGB 8.5* 8.9* 9.1* 8.4*  HCT 28.3* 29.7* 30.2* 28.5*  MCV 97.6 96.1 97.1 97.3  PLT 672* 705* 693* 559*   Blood Culture    Component Value Date/Time   SDES BLOOD RIGHT HAND 02/27/2017 1049   SPECREQUEST IN PEDIATRIC BOTTLE Blood Culture adequate volume 02/27/2017 1049   CULT NO GROWTH 5 DAYS 02/27/2017 1049   REPTSTATUS 03/04/2017 FINAL 02/27/2017 1049    Cardiac Enzymes: No results for input(s): CKTOTAL, CKMB, CKMBINDEX, TROPONINI in the last 168 hours. CBG:  Recent Labs Lab 03/08/17 1710  03/08/17 2000 03/09/17 0012 03/09/17 0411 03/09/17 0800  GLUCAP 157* 174* 191* 183* 180*   Iron Studies: No results for input(s): IRON, TIBC, TRANSFERRIN, FERRITIN in the last 72 hours. _1 @ Studies/Results: No results found. Medications: . sodium chloride 10 mL/hr at 03/03/17 1700  . sodium chloride 20 mL/hr at 03/07/17 0413  . sodium chloride    . sodium chloride    . sodium chloride    . sodium chloride     . calcium carbonate (dosed in mg elemental calcium)  2,000 mg of elemental calcium Oral TID  . chlorhexidine gluconate (MEDLINE KIT)  15 mL Mouth Rinse BID  . darbepoetin (ARANESP) injection - DIALYSIS  200 mcg Intravenous Q Tue-HD  . feeding supplement (NEPRO CARB STEADY)  1,000 mL Per Tube Q24H  . feeding supplement (PRO-STAT SUGAR FREE 64)  60 mL Per Tube QID  . [name]'s butt cream   Topical BID  . heparin  5,000 Units Subcutaneous Q8H  . insulin aspart  0-15 Units Subcutaneous Q4H  . mouth rinse  15 mL Mouth Rinse QID  . metoprolol succinate  25 mg Oral QHS  . multivitamin  1 tablet Oral QHS  . pantoprazole sodium  40 mg Per Tube Q24H   Dialysis: [facility] TTS   4h 39mn  F200   114kg  2/2 bath  LUA AVF  Hep none - Iron Sucrose (Venofer) 50 mg IVP During Dialysis 1X Week  - Mircera 75 mcg IV q 2 weeks ( last on 02/05/17) - Vitamin D (Calcitriol) Oral0.75 mcg Po q hd    Assessment/Plan: 1. Cardiac Arrest 05/19 with anoxic brain injury. TEE negative for endocarditis.  2. VDRF: Trach to vent, full support this AM Fio2 30%.  3. L foot Osteomyelitis: Recommended amputation but pt refusing. Being followed per ID/Ortho.  4. ESRD -T,Th, S. Last HD 03/07/17. Next HD tomorrow on schedule. Last K+ 3.1 Use 4.0 K bath.  5. Anemia - HGB 8.4. Cont ESA. No Fe D/T osteo.  6. Secondary hyperparathyroidism - Ca 9.2 Phos 3.9. Cont Ca Carbonate.  7. HTN/volume - BP controlled, doesn't appear volume overloaded. HD 03/09/17 Net UF 2675 Post Wt 95.7. Attempt 1.5-2 liters  tomorrow as tolerated. 8. Nutrition: Albumin 2.2 On tube feeding prostat/nepro  9. DM: per primary 10. NICM H/O diastolic/systolic HF. Volume controlled.   Rita H. Brown NP-C 03/09/2017, 9:43 AM  [facility] [phone]  Pt seen, examined and agree w A/P as above.  Kelly Splinter MD [employer] pager [phone]   03/09/2017, 10:18 AM

## 2017-03-09 NOTE — Progress Notes (Signed)
PROGRESS NOTE    [name]  MRN:[id number] DOB: 11/14/1980 DOA: 02/14/2017 PCP: Anderson, Teresa, FNP   Brief Narrative: 36-year-old female end-stage renal disease on hemodialysis secondary to poorly controlled diabetes mellitus, bilateral transmetatarsal foot amputations, obesity, hypertension, medical noncompliance, nonischemic cardiomyopathy who was admitted 02/14/2017 with left leg pain and found to have osteomyelitis with wound nonhealing, dehiscence, purulent drainage from the wound. Patient placed empirically on IV antibiotics, orthopedics and nephrology consulted. Patient was found unresponsive in the room on 02/14/2017 after getting IV Dilaudid in the afternoon for leg pain. Patient had a 9+ minute asystole cardiac arrest was intubated and transferred to the ICU and underwent cooling and rewarming. Patient with no significant neurological improvement and was assessed by neurology who felt that given patient's current neurological status the meaningful neurological recovery was very dismal.  Patient also seen in consultation by ID and placed empirically on IV antibiotics which have been D escalated to IV Rocephin, Flagyl and vancomycin. Patient has been followed by nephrology and receiving hemodialysis during the hospitalization. Orthopedics consulted and patient seen by Dr. 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 It is noted that patient has a dry gangrene S with exposed bone. Recommendations from orthopedics was to proceed with a transtibial amputation which has been discussed multiple times in the office prior to admission. As per, orthopedics patient does not want to proceed with transtibial amputation and mother reinforcing patient's wishes at this time. Patient currently on empiric IV antibiotics. I assume patient's care since 03/04/2017.  Second Orthopedic opinion on 03/04/2017.  Assessment & Plan:  # acute respiratory  failure/vent dependent -Status post ATC.  -Continue trach care protocol. -PCCM following. Now on trach collar.  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  -Continue Toprol-XL 25 mg. Continue to monitor HR and BP.   # end-stage renal disease on hemodialysis -Hemodialysis as per nephrologist. Monitor labs and blood pressure.   # moderate protein calorie malnutrition -Patient currently getting tube feeds. IR was consulted for PEG tube placement however due to patient's leukocytosis and intermittent fevers IR holding off on PEG tube secondary to concern for seeding. -Follow up on IR and palliative care.  # anemia of chronic disease and chronic kidney disease: Monitor CBC. No sign of bleeding.  # left foot osteomyelitis/gangrene  -Patient has been seen in consultation by orthopedics, Dr. Duda who has recommended amputation however patient refusing amputation.  -Patient completed 10 days of IV antibiotics including Flagyl, vancomycin and ceftriaxone on 03/06/2017. Patient continued to have intermittent fever and has leukocytosis. As per infectious disease no need for long-term antibiotics since the definitive treatment is to do surgery. -TEE negative for endocarditis.  -Had a second opinion from orthopedics on 03/04/2017. As per Orthopedics note from 6/6, plan for meeting with the family on Saturday. As per family, they didn't have discussion with orthopedics on Saturday 6.9.2018. -I discussed with Dr. Duda regarding plan of care for the foot gangrene. He said, the patient refused procedure when she was alert and awake. He recommended continue current supportive care and palliative care. He will come by to see the  patient and discuss with the family. I appreciated.   # acute metabolic encephalopathy/anoxic encephalopathy post cardiac arrest -opens eyes and following simple   commands by moving her head. Mental status unchanged  # diabetes mellitus type 2 with neuropathy -Continue sliding scale. Monitor blood sugar level.  # leukocytosis and intermittent fever:  due to dry gangrene. Evaluated by infectious disease. Recommended no long-term antibiotics since the treatment his only surgical amputation.  -Minimize blood draws for this critically ill patient. Lab draw during dialysis.  #Goals of care: Patient has no improvement in mental status. No significant clinical improvement. Palliative care evaluation ongoing. Patient has a poor prognosis.  # Diarrhea: check stool c diff. The stool c diff negative. Patient has a rectal tube. Loses stool likely  due to tube feeding as well. 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  Disposition Plan:  stepdown  unit..    Consultants:   Infectious disease: Dr. Graylon Good 02/24/2017  Neurology Dr.Lindzen 02/24/2017  Gastroenterology: Dr. Watt Climes 02/19/2017  Wound care  PCCM: Dr Vaughan Browner 02/14/2017  Nephrology: Dr.Schertz 02/14/2017  Orthopedics: Dr. Durward Fortes 02/14/2017  Orthopedics: Dr. Sharol Given  Orthopedics second opinion consult by Dr Ninfa Linden on 03/04/2017  Discussed with Dr. Louanne Skye from Orthopedics on 03/08/2017  Discussed with Dr. Sharol Given on 03/09/2017 regarding further plan of care   Procedures:  CT head 02/14/2017  CT abdomen and pelvis 03/01/2017  Right upper quadrant ultrasound 02/18/2017  TEE 03/02/2017  MRI brain 02/18/2017  Lower extremity Dopplers 02/13/2017 negative for DVT  EEG 02/15/2017>>>> findings consistent with moderate to severe global cerebral dysfunction. Consistent with anoxic injury given clinical course Antimicrobials: Clinidamycin 5/19 >> 5/21 Vancomycin 5/19 >> 5/25 >> restart 5/30.Marland KitchenMarland KitchenMarland Kitchen6/8 Zosyn 5/19 >> 5/22 Meropenem 5/22 >> 5/30 Flagyl 5/30 >>6/8 Ceftriaxone 5/30 >>6/8 Subjective: Seen and examined at bedside. No change in clinical condition. Mother at bedside. Review of systems Limited. Objective: Vitals:   03/09/17 0412 03/09/17 0418 03/09/17 0500 03/09/17 0727  BP: 125/84   117/78  Pulse: (!) 127   (!) 126  Resp: (!) 21   (!) 21  Temp: 98.5 F (36.9 C)     TempSrc: Oral     SpO2: 98% 99%  100%  Weight:   95.7 kg (210 lb 15.7 oz)   Height:        Intake/Output Summary (Last 24 hours) at 03/09/17 1005 Last data filed at 03/09/17 0600  Gross per 24 hour  Intake              625 ml  Output                0 ml  Net  625 ml   Filed Weights   03/07/17 0700 03/08/17 0452 03/09/17 0500  Weight: 92.8 kg (204 lb 9.4 oz) 95.7 kg (211 lb) 95.7 kg (210 lb 15.7 oz)    Examination:  General exam:Ill-looking female with tracheostomy lying in bed, not in distress Neck: Tracheostomy site looks clean Respiratory system: Coarse breath  sounds bilateral, good air entry Cardiovascular system: Regular, tachycardic, S1-S2 heard.  Gastrointestinal system: Abdomen is nondistended, soft and nontender. Normal bowel sounds heard. Central nervous system: Opening her eyes and following simple commands by moving her head, unchanged  Extremities: Unable to assess. Bilateral foot has dressing applied  Psychiatry: Unable to assess.     Data Reviewed: I have personally reviewed following labs and imaging studies  CBC:  Recent Labs Lab 03/03/17 0252 03/04/17 0430 03/05/17 0242 03/07/17 0725  WBC 21.7* 24.6* 26.7* 24.4*  NEUTROABS  --  20.0*  --   --   HGB 8.5* 8.9* 9.1* 8.4*  HCT 28.3* 29.7* 30.2* 28.5*  MCV 97.6 96.1 97.1 97.3  PLT 672* 705* 693* 902*   Basic Metabolic Panel:  Recent Labs Lab 03/02/17 2357 03/03/17 0252 03/04/17 0430 03/05/17 0242 03/07/17 0726  NA 134* 136 133* 138 140  K 4.0 4.5 3.4* 3.5 3.1*  CL 96* 95* 92* 95* 98*  CO2 _0 GLUCOSE 191* 157* 215* 173* 144*  BUN 42* 42* 56* 91* 82*  CREATININE 3.12* 3.11* 3.52* 4.75* 5.14*  CALCIUM 8.3* 8.4* 8.9 9.4 9.2  MG 1.9 1.9  --   --   --   PHOS 2.6 2.7 2.8 3.3 3.9   GFR: Estimated Creatinine Clearance: 18.3 mL/min (A) (by C-G formula based on SCr of 5.14 mg/dL (H)). Liver Function Tests:  Recent Labs Lab 03/03/17 0252 03/04/17 0430 03/05/17 0242 03/07/17 0726  ALBUMIN 2.0* 2.1* 2.1* 2.2*   No results for input(s): LIPASE, AMYLASE in the last 168 hours. No results for input(s): AMMONIA in the last 168 hours. Coagulation Profile: No results for input(s): INR, PROTIME in the last 168 hours. Cardiac Enzymes: No results for input(s): CKTOTAL, CKMB, CKMBINDEX, TROPONINI in the last 168 hours. BNP (last 3 results) No results for input(s): PROBNP in the last 8760 hours. HbA1C: No results for input(s): HGBA1C in the last 72 hours. CBG:  Recent Labs Lab 03/08/17 1710 03/08/17 2000 03/09/17 0012 03/09/17 0411 03/09/17 0800   GLUCAP 157* 174* 191* 183* 180*   Lipid Profile: No results for input(s): CHOL, HDL, LDLCALC, TRIG, CHOLHDL, LDLDIRECT in the last 72 hours. Thyroid Function Tests: No results for input(s): TSH, T4TOTAL, FREET4, T3FREE, THYROIDAB in the last 72 hours. Anemia Panel: No results for input(s): VITAMINB12, FOLATE, FERRITIN, TIBC, IRON, RETICCTPCT in the last 72 hours. Sepsis Labs: No results for input(s): PROCALCITON, LATICACIDVEN in the last 168 hours.  Recent Results (from the past 240 hour(s))  Culture, blood (Routine X 2) w Reflex to ID Panel     Status: None   Collection Time: 02/27/17 10:39 AM  Result Value Ref Range Status   Specimen Description BLOOD RIGHT HAND  Final   Special Requests IN PEDIATRIC BOTTLE Blood Culture adequate volume  Final   Culture NO GROWTH 5 DAYS  Final   Report Status 03/04/2017 FINAL  Final  Culture, blood (Routine X 2) w Reflex to ID Panel     Status: None   Collection Time: 02/27/17 10:49 AM  Result Value Ref Range Status   Specimen Description BLOOD RIGHT HAND  Final  Special Requests IN PEDIATRIC BOTTLE Blood Culture adequate volume  Final   Culture NO GROWTH 5 DAYS  Final   Report Status 03/04/2017 FINAL  Final  C difficile quick scan w PCR reflex     Status: None   Collection Time: 03/04/17  2:17 PM  Result Value Ref Range Status   C Diff antigen NEGATIVE NEGATIVE Final   C Diff toxin NEGATIVE NEGATIVE Final   C Diff interpretation No C. difficile detected.  Final         Radiology Studies: No results found.      Scheduled Meds: . calcium carbonate (dosed in mg elemental calcium)  2,000 mg of elemental calcium Oral TID  . chlorhexidine gluconate (MEDLINE KIT)  15 mL Mouth Rinse BID  . darbepoetin (ARANESP) injection - DIALYSIS  200 mcg Intravenous Q Tue-HD  . feeding supplement (NEPRO CARB STEADY)  1,000 mL Per Tube Q24H  . feeding supplement (PRO-STAT SUGAR FREE 64)  60 mL Per Tube QID  . [name]'s butt cream   Topical BID  .  heparin  5,000 Units Subcutaneous Q8H  . insulin aspart  0-15 Units Subcutaneous Q4H  . mouth rinse  15 mL Mouth Rinse QID  . metoprolol succinate  25 mg Oral QHS  . multivitamin  1 tablet Oral QHS  . pantoprazole sodium  40 mg Per Tube Q24H   Continuous Infusions: . sodium chloride 10 mL/hr at 03/03/17 1700  . sodium chloride 20 mL/hr at 03/07/17 0413  . sodium chloride    . sodium chloride    . sodium chloride    . sodium chloride       LOS: 23 days    Dron Prasad Bhandari, MD [facility] Pager [phone]  If 7PM-7AM, please contact night-coverage [email or web address] Password [email or web address] 03/09/2017, 10:05 AM   

## 2017-03-09 NOTE — Consult Note (Addendum)
WOC Nurse wound consult note Reason for Consult: Consult requested for buttocks.  Pt has been incontinent of stool constantly and Flexiseal has been inserted to attempt to contain the stool, however, it is leaking around the insertion site and pt has severe moisture-associated skin damage.Currently applied foam dressings are trapping stool against the skin where it is leaking and it is difficult to keep wounds from becoming soiled.  There are multiple systemic factors which can impair healing. Wound type: Multiple patchy areas of full thickness skin loss in patchy areas across buttocks; largest is approx 4X3X.1cm to left buttock., and another is located in upper gluteal fold, approx 3X2X.1cm.  Other areas are smaller and scattered.  All sites are red and moist, small amt yellow drainage, no odor. Pressure Injury POA: These are NOT pressure injuries; they are not located over bony prominence and size is irregular. Appearance is consistent with moisture associated skin damage. Periwound: Red moist macerated skin, painful to touch. Dressing procedure/placement/frequency: Dr Wilford Sports barrier cream has already been ordered to protect and promote healing.  Air mattress is in place to reduce pressure and increase airflow to the affected area.  Pt's family member at the bedside to assess wound appearance and discuss plan of care.   Please re-consult if further assistance is needed.  Thank-you,  Cammie Mcgee MSN, RN, CWOCN, [place], CNS [phone]

## 2017-03-10 DIAGNOSIS — Z7189 Other specified counseling: Secondary | ICD-10-CM

## 2017-03-10 LAB — RENAL FUNCTION PANEL
Albumin: 2.2 g/dL — ABNORMAL LOW (ref 3.5–5.0)
Anion gap: 18 — ABNORMAL HIGH (ref 5–15)
BUN: 131 mg/dL — ABNORMAL HIGH (ref 6–20)
CO2: 23 mmol/L (ref 22–32)
Calcium: 9.6 mg/dL (ref 8.9–10.3)
Chloride: 101 mmol/L (ref 101–111)
Creatinine, Ser: 6.92 mg/dL — ABNORMAL HIGH (ref 0.44–1.00)
GFR calc Af Amer: 8 mL/min — ABNORMAL LOW (ref >60)
GFR calc non Af Amer: 7 mL/min — ABNORMAL LOW (ref >60)
Glucose, Bld: 173 mg/dL — ABNORMAL HIGH (ref 65–99)
Phosphorus: 4.4 mg/dL (ref 2.5–4.6)
Potassium: 3.2 mmol/L — ABNORMAL LOW (ref 3.5–5.1)
Sodium: 142 mmol/L (ref 135–145)

## 2017-03-10 LAB — GLUCOSE, CAPILLARY
GLUCOSE-CAPILLARY: 144 mg/dL — AB (ref 65–99)
GLUCOSE-CAPILLARY: 173 mg/dL — AB (ref 65–99)
Glucose-Capillary: 247 mg/dL — ABNORMAL HIGH (ref 65–99)
Glucose-Capillary: 150 mg/dL — ABNORMAL HIGH (ref 65–99)
Glucose-Capillary: 180 mg/dL — ABNORMAL HIGH (ref 65–99)
Glucose-Capillary: 178 mg/dL — ABNORMAL HIGH (ref 65–99)

## 2017-03-10 LAB — CBC
HCT: 29.9 % — ABNORMAL LOW (ref 36.0–46.0)
Hemoglobin: 8.9 g/dL — ABNORMAL LOW (ref 12.0–15.0)
MCH: 28.8 pg (ref 26.0–34.0)
MCHC: 29.8 g/dL — ABNORMAL LOW (ref 30.0–36.0)
MCV: 96.8 fL (ref 78.0–100.0)
Platelets: 460 10*3/uL — ABNORMAL HIGH (ref 150–400)
RBC: 3.09 MIL/uL — ABNORMAL LOW (ref 3.87–5.11)
RDW: 18.5 % — ABNORMAL HIGH (ref 11.5–15.5)
WBC: 33.8 10*3/uL — ABNORMAL HIGH (ref 4.0–10.5)

## 2017-03-10 MED ORDER — SODIUM CHLORIDE 0.9 % IV SOLN: 100.0000 mL | INTRAVENOUS | Status: DC | PRN

## 2017-03-10 MED ORDER — CHLORHEXIDINE GLUCONATE 0.12 % MT SOLN
OROMUCOSAL | Status: AC
  Filled 2017-03-10: qty 15

## 2017-03-10 MED ORDER — DARBEPOETIN ALFA 200 MCG/0.4ML IJ SOSY
PREFILLED_SYRINGE | INTRAMUSCULAR | Status: AC
  Administered 2017-03-10: 200 ug via INTRAVENOUS
  Filled 2017-03-10: qty 0.4

## 2017-03-10 NOTE — Progress Notes (Signed)
PROGRESS NOTE    [name]  YSA:[id number] DOB: [birth year] DOA: 02/14/2017 PCP: Vicenta Aly, FNP   Brief Narrative: 36 year old female end-stage renal disease on hemodialysis secondary to poorly controlled diabetes mellitus, bilateral transmetatarsal foot amputations, obesity, hypertension, medical noncompliance, nonischemic cardiomyopathy who was admitted 02/14/2017 with left leg pain and found to have osteomyelitis with wound nonhealing, dehiscence, purulent drainage from the wound. Patient placed empirically on IV antibiotics, orthopedics and nephrology consulted. Patient was found unresponsive in the room on 02/14/2017 after getting IV Dilaudid in the afternoon for leg pain. Patient had a 9+ minute asystole cardiac arrest was intubated and transferred to the ICU and underwent cooling and rewarming. Patient with no significant neurological improvement and was assessed by neurology who felt that given patient's current neurological status the meaningful neurological recovery was very dismal.  Patient also seen in consultation by ID and placed empirically on IV antibiotics which have been D escalated to IV Rocephin, Flagyl and vancomycin. Patient has been followed by nephrology and receiving hemodialysis during the hospitalization. Orthopedics consulted and patient seen by Dr. Sharol Given and is noted that patient has a dry gangrene involving the heel and Achilles of her left foot. It is noted that patient has a dry gangrene S with exposed bone. Recommendations from orthopedics was to proceed with a transtibial amputation which has been discussed multiple times in the office prior to admission. As per, orthopedics patient does not want to proceed with transtibial amputation and mother reinforcing patient's wishes at this time. Patient currently on empiric IV antibiotics. I assume patient's care since 03/04/2017.  Second Orthopedic opinion on 03/04/2017.  Assessment & Plan:  # acute respiratory  failure/vent dependent -Status post ATC.  -Continue trach care protocol. -PCCM following. Now on trach collar.  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  -Continue Toprol-XL 25 mg. Continue to monitor HR and BP.   # end-stage renal disease on hemodialysis -Hemodialysis as per nephrologist. Monitor labs and blood pressure.   # moderate protein calorie malnutrition -Patient currently getting tube feeds. IR was consulted for PEG tube placement however due to patient's leukocytosis and intermittent fevers IR holding off on PEG tube secondary to concern for seeding. -I have discussed with the mother regarding the need of feeding tube placement. I discussed with interventional radiology. They cannot place until this time because of worsening leukocytosis and fever. Follow-up further plan.  # anemia of chronic disease and chronic kidney disease: Monitor CBC. No sign of bleeding.  # left foot osteomyelitis/gangrene  -Patient has been seen in consultation by orthopedics, Dr. Sharol Given who has recommended amputation however patient refusing amputation.  -Patient completed 10 days of IV antibiotics including Flagyl, vancomycin and ceftriaxone on 03/06/2017. Patient continued to have intermittent fever and has leukocytosis. As per infectious disease no need for long-term antibiotics since the definitive treatment is to do surgery. I discussed with Dr. Sharol Given regarding plan of care for the foot gangrene. As per Dr. Jess Barters recommendation from today, the wound is dry gangrenous change and does not seem to be at all health risk. -TEE negative for endocarditis.  -Had a second opinion from orthopedics on 03/04/2017. As per Orthopedics  note from 6/6, plan for meeting with the family on Saturday. As per family, they didn't have discussion with orthopedics on Saturday 03/07/2017. -Patient with  no clinical improvement. Discussed with the palliative care service.  -# acute metabolic encephalopathy/anoxic encephalopathy post cardiac arrest -opens eyes and following simple commands by moving her head. Unchanged mental status.  # diabetes mellitus type 2 with neuropathy -Continue sliding scale. Monitor blood sugar level.  # leukocytosis and intermittent fever:  due to dry gangrene. Evaluated by infectious disease. Recommended no long-term antibiotics since the treatment is only surgical amputation.  -Minimize blood draws for this critically ill patient. Lab draw during dialysis.  #Goals of care: No immediate surgical plan as per Dr. Sharol Given. Computed IV antibiotics and no long-term antibiotics indicated by infectious disease. Patient continues to have worsening leukocytosis and fever. I discussed with the patient's mother, IR, nurse and palliative care today. No change in mental status. Patient needs skilled facility with friends support on discharge. Follow up multiple consultants for comparison. Continue to provide current care. Palliative care service appreciated.  # Diarrhea: check stool c diff. The stool c diff negative. Patient has a rectal tube. Loses stool likely  due to tube feeding as well. 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  Disposition Plan:  stepdown unit..    Consultants:   Infectious disease: Dr. Graylon Good 02/24/2017  Neurology Dr.Lindzen 02/24/2017  Gastroenterology: Dr. Watt Climes 02/19/2017  Wound care  PCCM: Dr Vaughan Browner 02/14/2017  Nephrology: Dr.Schertz 02/14/2017  Orthopedics: Dr. Durward Fortes 02/14/2017  Orthopedics: Dr. Sharol Given  Orthopedics second opinion consult by Dr Ninfa Linden on 03/04/2017  Discussed with Dr. Louanne Skye from Orthopedics on 03/08/2017  Discussed with Dr. Sharol Given on 03/09/2017 regarding further plan of care   Procedures:  CT head 02/14/2017  CT abdomen and pelvis 03/01/2017  Right upper quadrant ultrasound 02/18/2017  TEE 03/02/2017  MRI brain 02/18/2017  Lower extremity Dopplers 02/13/2017 negative for DVT  EEG 02/15/2017>>>> findings consistent with moderate to severe global cerebral dysfunction. Consistent with anoxic injury given clinical course Antimicrobials: Clinidamycin 5/19 >> 5/21 Vancomycin 5/19 >> 5/25 >> restart 5/30.Marland KitchenMarland KitchenMarland Kitchen6/8 Zosyn 5/19 >> 5/22 Meropenem 5/22 >> 5/30 Flagyl 5/30 >>6/8 Ceftriaxone 5/30 >>6/8 Subjective: Seen and examined at bedside. No change in clinical status. Having intermittent fever. Review of system limited. Mother at bedside. Objective: Vitals:   03/10/17 1030 03/10/17 1045 03/10/17 1100 03/10/17 1115  BP: 97/61 103/63 97/62 (!) 110/96  Pulse: (!) 137 (!) 137 (!) 136 (!) 128  Resp: 15 18 (!) 34 (!) 32  Temp:      TempSrc:      SpO2: 98% 97%  100%  Weight:  Height:         Intake/Output Summary (Last 24 hours) at 03/10/17 1124 Last data filed at 03/10/17 0600  Gross per 24 hour  Intake              165 ml  Output              500 ml  Net             -335 ml   Filed Weights   03/09/17 0500 03/10/17 0457 03/10/17 0744  Weight: 95.7 kg (210 lb 15.7 oz) 93.9 kg (207 lb) 93.6 kg (206 lb 5.6 oz)    Examination:  General exam:Ill-looking female with tracheostomy, lying in bed, unchanged clinical status Neck: Tracheostomy site looks clean Respiratory system: Coarse breath sound bilateral, good air entry Cardiovascular system: Regular, tachycardic, S1 and S2 normal.  Gastrointestinal system: Abdomen soft, nondistended. Bowel sound positive Central nervous system: Opening her eyes and tracking with the name, no significant change. Extremities: Unable to assess. Bilateral foot has dressing applied  Psychiatry: Unable to assess.     Data Reviewed: I have personally reviewed following labs and imaging studies  CBC:  Recent Labs Lab 03/04/17 0430 03/05/17 0242 03/07/17 0725 03/10/17 0721  WBC 24.6* 26.7* 24.4* 33.8*  NEUTROABS 20.0*  --   --   --   HGB 8.9* 9.1* 8.4* 8.9*  HCT 29.7* 30.2* 28.5* 29.9*  MCV 96.1 97.1 97.3 96.8  PLT 705* 693* 559* 680*   Basic Metabolic Panel:  Recent Labs Lab 03/04/17 0430 03/05/17 0242 03/07/17 0726 03/10/17 0722  NA 133* 138 140 142  K 3.4* 3.5 3.1* 3.2*  CL 92* 95* 98* 101  CO2 _0 GLUCOSE 215* 173* 144* 173*  BUN 56* 91* 82* 131*  CREATININE 3.52* 4.75* 5.14* 6.92*  CALCIUM 8.9 9.4 9.2 9.6  PHOS 2.8 3.3 3.9 4.4   GFR: Estimated Creatinine Clearance: 13.4 mL/min (A) (by C-G formula based on SCr of 6.92 mg/dL (H)). Liver Function Tests:  Recent Labs Lab 03/04/17 0430 03/05/17 0242 03/07/17 0726 03/10/17 0722  ALBUMIN 2.1* 2.1* 2.2* 2.2*   No results for input(s): LIPASE, AMYLASE in the last 168 hours. No results for input(s): AMMONIA in the last 168 hours. Coagulation  Profile: No results for input(s): INR, PROTIME in the last 168 hours. Cardiac Enzymes: No results for input(s): CKTOTAL, CKMB, CKMBINDEX, TROPONINI in the last 168 hours. BNP (last 3 results) No results for input(s): PROBNP in the last 8760 hours. HbA1C: No results for input(s): HGBA1C in the last 72 hours. CBG:  Recent Labs Lab 03/09/17 1605 03/09/17 2110 03/09/17 2333 03/10/17 0443 03/10/17 0813  GLUCAP 182* 232* 173* 247* 150*   Lipid Profile: No results for input(s): CHOL, HDL, LDLCALC, TRIG, CHOLHDL, LDLDIRECT in the last 72 hours. Thyroid Function Tests: No results for input(s): TSH, T4TOTAL, FREET4, T3FREE, THYROIDAB in the last 72 hours. Anemia Panel: No results for input(s): VITAMINB12, FOLATE, FERRITIN, TIBC, IRON, RETICCTPCT in the last 72 hours. Sepsis Labs: No results for input(s): PROCALCITON, LATICACIDVEN in the last 168 hours.  Recent Results (from the past 240 hour(s))  C difficile quick scan w PCR reflex     Status: None   Collection Time: 03/04/17  2:17 PM  Result Value Ref Range Status   C Diff antigen NEGATIVE NEGATIVE Final   C Diff toxin NEGATIVE NEGATIVE Final   C Diff interpretation No C. difficile detected.  Final  Radiology Studies: No results found.      Scheduled Meds: . calcium carbonate (dosed in mg elemental calcium)  2,000 mg of elemental calcium Oral TID  . chlorhexidine gluconate (MEDLINE KIT)  15 mL Mouth Rinse BID  . darbepoetin (ARANESP) injection - DIALYSIS  200 mcg Intravenous Q Tue-HD  . feeding supplement (NEPRO CARB STEADY)  1,000 mL Per Tube Q24H  . feeding supplement (PRO-STAT SUGAR FREE 64)  60 mL Per Tube QID  . [name]'s butt cream   Topical QID  . heparin  5,000 Units Subcutaneous Q8H  . insulin aspart  0-15 Units Subcutaneous Q4H  . mouth rinse  15 mL Mouth Rinse QID  . metoprolol succinate  25 mg Oral QHS  . multivitamin  1 tablet Oral QHS  . pantoprazole sodium  40 mg Per Tube Q24H   Continuous  Infusions: . sodium chloride 10 mL/hr at 03/03/17 1700  . sodium chloride 20 mL/hr at 03/07/17 0413  . sodium chloride    . sodium chloride    . sodium chloride    . sodium chloride    . sodium chloride    . sodium chloride       LOS: 24 days    Beth Kubicki Tanna Furry, MD [facility] Pager [phone]  If 7PM-7AM, please contact night-coverage [email or web address] Password [facility] 03/10/2017, 11:24 AM

## 2017-03-10 NOTE — Progress Notes (Addendum)
Daily Progress Note   Patient Name: [name]       Date: 03/10/2017 DOB: [birth year]  Age: 36 y.o. MRN#: [id number] Attending Physician: Rosita Fire, MD Primary Care Physician: Vicenta Aly, FNP Admit Date: 02/14/2017  Reason for Consultation/Follow-up: Establishing goals of care  Subjective: Patient with anoxic brain injury on trach unable to speak.  Mother sitting quietly at bedside in a wheelchair.  I spoke with her mother in the conference room.  Mrs Beth, [name] mother, has had a stroke and tells me that she has a difficult time remembering and processing things.  Mrs. 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  Prior to this hospitalization she was living at home with [name] and was unable to walk but otherwise functional.  We added the patient's sister, [name] (in [place]) on by conference call.  I explained the cardiac arrest, anoxic brain injury with resulting disability, on going heart failure, respiratory failure, kidney failure, and failure of [name]'s skin/bones (osteomyelitis).  We discussed [name] living in a facility for the rest of her life, and being so fragile.  With osteo, trach, HD cath, and some type of feeding tube - she is just waiting for the next infection to happen.  Beth understands that her sister can not tolerate surgery (amputation) at this point, and even if she could, it is doubtful that [name] would heal after surgery.    Beth was thankful on the phone and stated she feels she has a much better understanding of what is happening to her sister.  We discussed code status.  Mrs. Dugo broke down into tears.  Beth, hearing her mother also broke into tears.  I asked the family to process  all of this information and we would talk again another time.  We scheduled a family meeting in person with Mrs. Rouse and [name].  The earliest [place] can be here is Friday 6/15.   I committed to seeing [name] and calling Beth each day until Friday.    Assessment: 48 yof type 1 DM with ESRD and PVD who had all of her toes amputated in 08/2016 (per her mother) and has been unable to walk since.  She was admitted 5/19 with a spider bite (per her mother) and  suffered cardiac arrest and anoxic brain injury.  She became ventilator dependent and is now trached.  She has an NG in place for nutrition (albumin 2.2), but due to recurrent low grade fever and the placement of her stomach (deep to her liver) PEG placement is risky and has been deferred by IR.  She has gangrene of her left foot.  Ortho recommends watchful waiting. Given her recent cardiac arrest and heart failure there is a question of whether or not she is stable for surgery, and whether or not she would heal from surgery.  She has wounds on her sacral area and a rectal tube in place.  She intermittently runs a fever of approx 100.8.  She is no longer on antibiotics.  ID commented that the fevers may be central.    This is an extremely difficult situation.  I spoke with Drs. Carolin Sicks and [place]an[place] as well as with IR PA Jannifer Franklin, and Palliative Medicine NP Wadie Lessen about the case at length.  After much consideration, I believe Ms. Buzzelli is a poor surgical candidate and would likely not be able to heal from surgery.  She is a very high infection risk (gangrene, HD cath, trach, sacral wounds) and I am concerned about another infection overwhelming her body.  I do not believe she will walk again (she hasn't since 12/17) and I fear her functionality will be limited to receiving HD in a vent SNF or LTAC.   Patient Profile/HPI: 36 yo female with DM1, COPD, CAD, PVD who was admitted and suffered asystole cardiac arrest.  She now has an anoxic brain  injury   Length of Stay: 24  Current Medications: Scheduled Meds:  . calcium carbonate (dosed in mg elemental calcium)  2,000 mg of elemental calcium Oral TID  . chlorhexidine gluconate (MEDLINE KIT)  15 mL Mouth Rinse BID  . darbepoetin (ARANESP) injection - DIALYSIS  200 mcg Intravenous Q Tue-HD  . feeding supplement (NEPRO CARB STEADY)  1,000 mL Per Tube Q24H  . feeding supplement (PRO-STAT SUGAR FREE 64)  60 mL Per Tube QID  . [name]'s butt cream   Topical QID  . heparin  5,000 Units Subcutaneous Q8H  . insulin aspart  0-15 Units Subcutaneous Q4H  . mouth rinse  15 mL Mouth Rinse QID  . metoprolol succinate  25 mg Oral QHS  . multivitamin  1 tablet Oral QHS  . pantoprazole sodium  40 mg Per Tube Q24H    Continuous Infusions: . sodium chloride 10 mL/hr at 03/03/17 1700  . sodium chloride 20 mL/hr at 03/07/17 0413  . sodium chloride    . sodium chloride      PRN Meds: sodium chloride, sodium chloride, acetaminophen (TYLENOL) oral liquid 160 mg/5 mL, albuterol, fentaNYL (SUBLIMAZE) injection, metoprolol tartrate  Physical Exam        Well developed female, eyes open, lying in bed receiving HD.  She bobs her head against the side rail. She nods at me when I ask a question.  She is able to follow a simple command to squeeze my hand Her lower extremities are in prevlon boots and her left left is significantly swollen compared to her right.  Vital Signs: BP 107/65   Pulse (!) 120   Temp 98.2 F (36.8 C) (Oral)   Resp (!) 25   Ht 5' 8.5" (1.74 m)   Wt 92.3 kg (203 lb 7.8 oz)   LMP  (LMP Unknown) Comment: Patient trached  SpO2 100%   BMI  30.49 kg/m  SpO2: SpO2: 100 % O2 Device: O2 Device: Ventilator O2 Flow Rate: O2 Flow Rate (L/min): 10 L/min  Intake/output summary:   Intake/Output Summary (Last 24 hours) at 03/10/17 1735 Last data filed at 03/10/17 1156  Gross per 24 hour  Intake              165 ml  Output             1975 ml  Net            -1810 ml   LBM:  Last BM Date: 03/03/17 (flexiseal) Baseline Weight: Weight: 108 kg (238 lb) Most recent weight: Weight: 92.3 kg (203 lb 7.8 oz)       Palliative Assessment/Data:    Flowsheet Rows     Most Recent Value  Intake Tab  Referral Department  Hospitalist  Unit at Time of Referral  ICU  Palliative Care Primary Diagnosis  Nephrology  Date Notified  03/03/17  Palliative Care Type  New Palliative care  Reason for referral  Clarify Goals of Care  Date of Admission  02/14/17  Date first seen by Palliative Care  03/04/17  # of days Palliative referral response time  1 Day(s)  # of days IP prior to Palliative referral  17  Clinical Assessment  Palliative Performance Scale Score  20%  Psychosocial & Spiritual Assessment  Palliative Care Outcomes  Patient/Family meeting held?  Yes  Who was at the meeting?  mother and sister  Palliative Care Outcomes  Clarified goals of care      Patient Active Problem List   Diagnosis Date Noted  . Tachycardia   . DNR (do not resuscitate) discussion 03/04/2017  . Palliative care by specialist 03/04/2017  . Protein calorie malnutrition ([place])   . Acute on chronic respiratory failure with hypoxemia ([place])   . Bacteremia   . Encounter for central line placement   . Elevated liver enzymes   . Jaundice   . Wound dehiscence   . Coma ([place])   . Osteomyelitis ([place]) 02/14/2017  . Pressure injury of skin 02/14/2017  . Cardiac arrest, cause unspecified ([place])   . Dehiscence of amputation stump ([place])   . Status post transmetatarsal amputation of left foot ([place]) 10/21/2016  . Status post transmetatarsal amputation of right foot ([place]) 10/08/2016  . 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 Diabetic osteomyelitis ([place])   . Type 1 diabetes mellitus with nephropathy ([place])   . Left foot infection 06/20/2016  . ESRD (end stage renal disease) ([place]) 06/20/2016  . Diabetic foot ulcer ([place]) 06/20/2016  . Chronic pain  syndrome 02/24/2016  . Normal coronary arteries 10/12/2015  . NSTEMI- type 2, Troponin 11.2 05/11/2015  . Acute respiratory failure ([place]) 05/10/2015  . History of arthroscopy of right shoulder-05/10/15 05/10/2015  . CKD (chronic kidney disease) stage 5, GFR less than 15 ml/min (HCC) 09/21/2014  . Unspecified asthma(493.90) 10/25/2013  . Acute combined systolic and diastolic heart failure ([place]) 09/21/2013  . Non-ischemic cardiomyopathy - by echo 8/16- EF 35-40% 09/16/2013  . Morbid obesity-  09/16/2013  . Uncontrolled type 2 diabetes with renal manifestation ([place]) 09/15/2013  . Normocytic anemia 09/15/2013  . Lower extremity edema 09/15/2013  . Hyperlipidemia   . Hypertension     Palliative Care Plan    Recommendations/Plan:  PMT will continue to follow up with the family daily.  Family meeting scheduled 6/15 when sister arrives from [place].  Mother is unable  to make decisions for the patient due to previous stroke.  Goals of Care and Additional Recommendations:  Limitations on Scope of Treatment: Full Scope Treatment  Code Status:  Full code  Prognosis:   Unable to determine - pending ability continue to receive nutrition and goals of care (comfort vs full scope treatment)   Discharge Planning:  To Be Determined  Care plan was discussed with Drs Margit Hanks, PMT team, IR, bedside RN  Thank you for allowing the Palliative Medicine Team to assist in the care of this patient.  Total time spent:  90 min. Time in 10:00 am Time Out 11:30 am.     Greater than 50%  of this time was spent counseling and coordinating care related to the above assessment and plan.  Imogene Burn, PA-C Palliative Medicine  Please contact Palliative MedicineTeam phone at [phone] for questions and concerns between 7 am - 7 pm.   Please see AMION for individual provider pager numbers.

## 2017-03-10 NOTE — Progress Notes (Signed)
Patient ID: [name], female   DOB: [birth year], 36 y.o.   MRN: [id number] Patient is reevaluated. She has dry gangrene of the left foot. Patient's mother is not at bedside. Patient would not respond to commands. She rolled her head back and forth she had a blank stare in her eyes. She would not focus or look at me during questioning. Patient is unable to provide informed consent. Her wound has dry gangrenous changes she does not seem to be at a health risk with the dry gangrene. I feel this can be watched conservatively and would proceed with surgery if patient develops any signs of abscess infection or cellulitis.

## 2017-03-10 NOTE — Progress Notes (Signed)
Chaplain visited with patient via consult.  Patient is getting dialysis at this time.  Chaplain introduced herself to the patient.  Chaplain found mother, sister and niece in the waiting room and visited with them.  Mother is concerned for her daughter because she is not sure she will recover.  Chaplain spoke to mother about what she needed that could be helpful for her.  Chaplain shared that we could pray for the best possible outcome for her daughter and keep holding her in prayer.  Mother appreciative of Chaplain presence.  Family thanked Chaplain considerably for coming to talk to them.      03/10/17 1131  Clinical Encounter Type  Visited With Patient;Family  Visit Type Initial;Psychological support;Spiritual support;Social support  Referral From Nurse  Consult/Referral To Chaplain  Stress Factors  Patient Stress Factors Health changes  Family Stress Factors Health changes

## 2017-03-10 NOTE — Progress Notes (Signed)
PULMONARY / CRITICAL CARE MEDICINE   Name: [name] MRN: [id number] DOB: 05-21-1981    ADMISSION DATE:  02/14/2017 CONSULTATION DATE:  02/14/2017  REFERRING MD:  Feliz Beam MD  CHIEF COMPLAINT:  Sepsis, cardiac arrest  BRIEF SUMMARY:   36 year old female w/ ESRD d/t poorly controlled DM, bilateral transmetatarsal foot amputations, obesity, HTN, medical non-compliance as well as NICM. Admitted 5/19 with complaints of left leg pain, found to have radiological evidence of osteomyelitis with wound nonhealing, dehiscence, purulent drainage from the wound. Admitted for IV antibiotics, hemodialysis and possible surgery.  Found unresponsive in the room on 5/19. She had chest compressions, epi bicarb given. She was intubated by anesthesia and had ROSC in approximately 9 minutes. Downtime prior to code is unknown. She got dilaudid in the afternoon for leg pain.  SUBJECTIVE:  No follows  commands  VITAL SIGNS: BP (!) 110/96   Pulse (!) 128   Temp 99.4 F (37.4 C) (Oral)   Resp (!) 32   Ht 5' 8.5" (1.74 m)   Wt 206 lb 5.6 oz (93.6 kg)   LMP  (LMP Unknown) Comment: Patient trached  SpO2 100%   BMI 30.92 kg/m   VENTILATOR SETTINGS: Vent Mode: PRVC FiO2 (%):  [30 %] 30 % Set Rate:  [16 bmp] 16 bmp Vt Set:  [520 mL] 520 mL PEEP:  [5 cmH20] 5 cmH20 Plateau Pressure:  [19 cmH20-24 cmH20] 24 cmH20  INTAKE / OUTPUT: I/O last 3 completed shifts: In: 405 [I.V.:60; NG/GT:345] Out: 500 [Stool:500]  General:  Frail ill appearing female on full vent support HEENT:Trach, sutures have been removed NWG:NFAOZH to evaluate Neuro: NO follows commands. Whips head side to side CV: HSR ST122 PULM: even/non-labored, lungs bilaterally rhonchi YQ:MVHQ, non-tender, bsx4 active  Extremities: warm/dry, - edema , wounds and amputation unchanged Skin: no rashes or lesions    LABS:  BMET  Recent Labs Lab 03/05/17 0242 03/07/17 0726 03/10/17 0722  NA 138 140 142  K 3.5 3.1* 3.2*  CL 95* 98* 101   CO2 25 26 23   BUN 91* 82* 131*  CREATININE 4.75* 5.14* 6.92*  GLUCOSE 173* 144* 173*    Electrolytes  Recent Labs Lab 03/05/17 0242 03/07/17 0726 03/10/17 0722  CALCIUM 9.4 9.2 9.6  PHOS 3.3 3.9 4.4    CBC  Recent Labs Lab 03/05/17 0242 03/07/17 0725 03/10/17 0721  WBC 26.7* 24.4* 33.8*  HGB 9.1* 8.4* 8.9*  HCT 30.2* 28.5* 29.9*  PLT 693* 559* 460*    No results for input(s): PHART, PCO2ART, PO2ART in the last 168 hours.  Liver Enzymes  Recent Labs Lab 03/05/17 0242 03/07/17 0726 03/10/17 0722  ALBUMIN 2.1* 2.2* 2.2*   Glucose  Recent Labs Lab 03/09/17 1151 03/09/17 1605 03/09/17 2110 03/09/17 2333 03/10/17 0443 03/10/17 0813  GLUCAP 188* 182* 232* 173* 247* 150*   STUDIES:  Lower extremity ultrasound 5/18 >> no evidence of DVT CT head 5/19 >> normal exam Echo 5/20 >> EF 30-35%, increased LVF. Diffuse hypokinesis, akinesis of basilar mid-inferior myocardium, grade 2 diastolic dysfxn  EEG 5/20 >> finding c/w mod to severe global cerebral dysfxn. C/w anoxic injury given clinical course  LE [place] 5/21 >> negative MRI brain 5/24 >> ?thrombosed cortical vein, otherwise normal TEE 6/4 >> mild MR, normal AV, mild TR, mild PR, EF 30-35%, diffuse hypokinesis, no thrombus, no PFO, normal RV, moderate pericardial effusion with synechia suggesting some chronicity, no vegetations   CULTURES: Bcx 5/19 X 2 >> negative Blood cultures 6/1 >>  negative c diff 6/1 >> negative  ANTIBIOTICS: Clinidamycin 5/19 >> 5/21 Vancomycin 5/19 >> 5/25 >> restart 5/30 Zosyn 5/19 >> 5/22 Meropenem 5/22 >> 5/30 Flagyl 5/30 >>off Ceftriaxone 5/30 >>off  SIGNIFICANT EVENTS: 5/19  Admit, cardiac arrest, transfer to ICU; hypothermia protocol started 5/24  Off pressors.  MRI w/out evidence of ischemia  6/03  Tolerated ATC couple hours, then back to vent, HD   LINES/TUBES: ETT 5/19 >> 5/31 Lt IJ CVL 5/19 >> out Trach 5/31 >>   ASSESSMENT / PLAN:  Discussion:  36 y/o F with  PMH of poorly controlled DM, ESRD on HD and known gangrene of L achilles heel, admitted with non-healing wounds.  Admitted per TRH.  Received dilaudid for pain and subsequently suffered a cardiac arrest. Tx to ICU.   Pulmonary: Acute respiratory failure - in setting of cardiac arrest, chronic critical illness, requiring ventilator for support, making progress with weaning. Currently on full vent support during HD 6/12 P: PRVC as rest mode  PSV to ATC as tolerated  Trach care per protocol  Push PT, nutrition efforts as tolerated Intermittent CXR Primary working on source control for foot > second opinion obtained, remains a full code 6/12   All other medical issues below per TRH, appreciate assistance.  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 Per ID recommend total 10 days of antibiotics.  Continue ceftriaxone, flagyl plus vancomycin. Endocarditis Ruled Out 6/4 on TEE Diarrhea  DM type II Acute metabolic encephalopathy > by report improved yesterday Anoxic encephalopathy after cardiac arrest Hx of DM neuropathy    DVT prophylaxis - SQ heparin SUP - protonix Nutrition - tube feeds Goals of care - full code  Global:  TRH for primary medical care, appreciate assistance. PCCM will continue to follow for trach care.  Will follow up 6/14 unless new needs arise.   Brett Canales Minor ACNP [facility] Pager [phone] till 3 pm If no answer page [phone] 03/10/2017, 11:28 AM   Attending Note:  I have examined patient, reviewed labs, studies and notes. I have discussed the case with S Minor, and I agree with the data and plans as amended above.   36 yo woman with hx DM, ESRD, HTN, now anoxic brain injury and VDRF post arrest. She  underwent trach placement 5/31, has begun to do ATC for extended periods, then back to [facility]. Sutures removed on 6/9. On eval she is on MV, is moving her head but is unresponsive to me. She does appear to track. Would recommend continued ATC through the day, slowly try to extend the duration, back to MV at night for now. Will not plan to downsize for now since she is not vent free.   Levy Pupa, MD, PhD 03/10/2017, 2:44 PM [facility] [phone] or if no answer [phone]

## 2017-03-10 NOTE — Progress Notes (Addendum)
Patient ID: [name], female   DOB: 26-Dec-1980, 36 y.o.   MRN: [id number]   IR aware of Percutaneous gastric tube placement request Protein calorie malnutrition Per Dr Ronalee Belts  Still with labile fever Still wbc of 33.8  Discussed with Dr Ronalee Belts Palliative team has evaluated pt---awaiting their note  We will continue to watch for now When pt is more appropriate for percutaneous procedure -- we can reconsider timing  Need afeb x 48 hr and wbc wnl or trending to close to normal  Dr Ronalee Belts aware and agreeable

## 2017-03-10 NOTE — Progress Notes (Addendum)
[facility] Progress Note   Subjective: Awake on vent. Not following commands. Mother at bedside.   Objective Vitals:   03/10/17 1130 03/10/17 1145 03/10/17 1156 03/10/17 1210  BP: 103/85 100/81 105/72 98/69  Pulse: 81 (!) 122  (!) 120  Resp: (!) 28 (!) 24  (!) 32  Temp:   98.2 F (36.8 C)   TempSrc:   Oral   SpO2: 100%  100% 100%  Weight:   92.3 kg (203 lb 7.8 oz)   Height:       Physical Exam General: chronically ill appearing female in NAD. Eyes open, not tracking objects.  Heart: Y6,M6, 2/^ systolic M. No JVD. HR 120s Lungs: trach to vent bilateral breath sounds-few coarse upper airway BS.  Abdomen: active BS Feeding tube present. Extremities: Bilateral heel protectors. No LE edema.  Dialysis Access: LUA AVF + bruit faint in arterial portion of AVF  Dialysis: [facility] TTS   4h 97mn  F200   114kg  2/2 bath  LUA AVF  Hep none - Iron Sucrose (Venofer) 50 mg IVP During Dialysis 1X Week  - Mircera 75 mcg IV q 2 weeks ( last on 02/05/17) - Vitamin D (Calcitriol) Oral0.75 mcg Po q hd    Assess: 1. Cardiac Arrest 05/19 with anoxic brain injury. TEE negative for endocarditis.  2. VDRF: Trach to vent, full support this AM Fio2 30%.  3. L foot Osteomyelitis: Recommended amputation but pt refusing. Being followed per ID/Ortho.  4. ESRD - HD TTS, may need extra HD this week due to high solute (B/ Cr).  5. Anemia - HGB 8.4. Cont ESA. No Fe D/T osteo.  6. Secondary hyperparathyroidism - Ca 9.2 Phos 3.9. Cont Ca Carbonate.  7. HTN/volume - BP good, no vol excess one xam 8. Nutrition: Albumin 2.2 On tube feeding prostat/nepro  9. DM: per primary 10. NICM H/O diastolic/systolic HF. Volume controlled.   Plan - as above  RKelly SplinterMD [facility] pager [phone]  03/10/2017, 12:55 PM    Additional Objective Labs: Basic Metabolic Panel:  Recent Labs Lab 03/05/17 0242 03/07/17 0726 03/10/17 0722  NA 138 140 142  K 3.5 3.1* 3.2*  CL 95*  98* 101  CO2 25 26 23   GLUCOSE 173* 144* 173*  BUN 91* 82* 131*  CREATININE 4.75* 5.14* 6.92*  CALCIUM 9.4 9.2 9.6  PHOS 3.3 3.9 4.4   Liver Function Tests:  Recent Labs Lab 03/05/17 0242 03/07/17 0726 03/10/17 0722  ALBUMIN 2.1* 2.2* 2.2*   No results for input(s): LIPASE, AMYLASE in the last 168 hours. CBC:  Recent Labs Lab 03/04/17 0430 03/05/17 0242 03/07/17 0725 03/10/17 0721  WBC 24.6* 26.7* 24.4* 33.8*  NEUTROABS 20.0*  --   --   --   HGB 8.9* 9.1* 8.4* 8.9*  HCT 29.7* 30.2* 28.5* 29.9*  MCV 96.1 97.1 97.3 96.8  PLT 705* 693* 559* 460*   Blood Culture    Component Value Date/Time   SDES BLOOD RIGHT HAND 02/27/2017 1049   SPECREQUEST IN PEDIATRIC BOTTLE Blood Culture adequate volume 02/27/2017 1049   CULT NO GROWTH 5 DAYS 02/27/2017 1049   REPTSTATUS 03/04/2017 FINAL 02/27/2017 1049    Cardiac Enzymes: No results for input(s): CKTOTAL, CKMB, CKMBINDEX, TROPONINI in the last 168 hours. CBG:  Recent Labs Lab 03/09/17 2110 03/09/17 2333 03/10/17 0443 03/10/17 0813 03/10/17 1239  GLUCAP 232* 173* 247* 150* 144*   Iron Studies: No results for input(s): IRON, TIBC, TRANSFERRIN, FERRITIN in the last  72 hours. @lablastinr3 @ Studies/Results: No results found. Medications: . sodium chloride 10 mL/hr at 03/03/17 1700  . sodium chloride 20 mL/hr at 03/07/17 0413  . sodium chloride    . sodium chloride    . sodium chloride    . sodium chloride    . sodium chloride    . sodium chloride     . calcium carbonate (dosed in mg elemental calcium)  2,000 mg of elemental calcium Oral TID  . chlorhexidine gluconate (MEDLINE KIT)  15 mL Mouth Rinse BID  . darbepoetin (ARANESP) injection - DIALYSIS  200 mcg Intravenous Q Tue-HD  . feeding supplement (NEPRO CARB STEADY)  1,000 mL Per Tube Q24H  . feeding supplement (PRO-STAT SUGAR FREE 64)  60 mL Per Tube QID  . [name]'s butt cream   Topical QID  . heparin  5,000 Units Subcutaneous Q8H  . insulin aspart  0-15  Units Subcutaneous Q4H  . mouth rinse  15 mL Mouth Rinse QID  . metoprolol succinate  25 mg Oral QHS  . multivitamin  1 tablet Oral QHS  . pantoprazole sodium  40 mg Per Tube Q24H

## 2017-03-11 ENCOUNTER — Encounter (HOSPITAL_COMMUNITY): Payer: Self-pay | Admitting: Interventional Radiology

## 2017-03-11 ENCOUNTER — Inpatient Hospital Stay (HOSPITAL_COMMUNITY): Payer: Medicaid Other

## 2017-03-11 DIAGNOSIS — R6510 Systemic inflammatory response syndrome (SIRS) of non-infectious origin without acute organ dysfunction: Secondary | ICD-10-CM

## 2017-03-11 DIAGNOSIS — Z515 Encounter for palliative care: Secondary | ICD-10-CM

## 2017-03-11 DIAGNOSIS — G931 Anoxic brain damage, not elsewhere classified: Secondary | ICD-10-CM

## 2017-03-11 HISTORY — PX: IR FLUORO GUIDE CV LINE RIGHT: IMG2283

## 2017-03-11 HISTORY — PX: IR US GUIDE VASC ACCESS RIGHT: IMG2390

## 2017-03-11 LAB — RENAL FUNCTION PANEL
ALBUMIN: 2.5 g/dL — AB (ref 3.5–5.0)
Anion gap: 15 (ref 5–15)
BUN: 74 mg/dL — ABNORMAL HIGH (ref 6–20)
CHLORIDE: 98 mmol/L — AB (ref 101–111)
CO2: 27 mmol/L (ref 22–32)
CREATININE: 4.65 mg/dL — AB (ref 0.44–1.00)
Calcium: 9.3 mg/dL (ref 8.9–10.3)
GFR, EST AFRICAN AMERICAN: 13 mL/min — AB (ref >60)
GFR, EST NON AFRICAN AMERICAN: 11 mL/min — AB (ref >60)
Glucose, Bld: 175 mg/dL — ABNORMAL HIGH (ref 65–99)
PHOSPHORUS: 3.4 mg/dL (ref 2.5–4.6)
POTASSIUM: 4 mmol/L (ref 3.5–5.1)
Sodium: 140 mmol/L (ref 135–145)

## 2017-03-11 LAB — CBC WITH DIFFERENTIAL/PLATELET
BASOS ABS: 0 10*3/uL (ref 0.0–0.1)
Basophils Relative: 0 %
EOS PCT: 2 %
Eosinophils Absolute: 0.6 10*3/uL (ref 0.0–0.7)
HEMATOCRIT: 30.5 % — AB (ref 36.0–46.0)
Hemoglobin: 9.1 g/dL — ABNORMAL LOW (ref 12.0–15.0)
Lymphocytes Relative: 8 %
Lymphs Abs: 2.3 10*3/uL (ref 0.7–4.0)
MCH: 29.1 pg (ref 26.0–34.0)
MCHC: 29.8 g/dL — AB (ref 30.0–36.0)
MCV: 97.4 fL (ref 78.0–100.0)
MONO ABS: 2.6 10*3/uL — AB (ref 0.1–1.0)
MONOS PCT: 9 %
NEUTROS PCT: 81 %
Neutro Abs: 23 10*3/uL — ABNORMAL HIGH (ref 1.7–7.7)
PLATELETS: 377 10*3/uL (ref 150–400)
RBC: 3.13 MIL/uL — ABNORMAL LOW (ref 3.87–5.11)
RDW: 18.4 % — AB (ref 11.5–15.5)
WBC: 28.5 10*3/uL — ABNORMAL HIGH (ref 4.0–10.5)

## 2017-03-11 LAB — GLUCOSE, CAPILLARY
GLUCOSE-CAPILLARY: 156 mg/dL — AB (ref 65–99)
GLUCOSE-CAPILLARY: 142 mg/dL — AB (ref 65–99)
Glucose-Capillary: 189 mg/dL — ABNORMAL HIGH (ref 65–99)
Glucose-Capillary: 229 mg/dL — ABNORMAL HIGH (ref 65–99)
Glucose-Capillary: 131 mg/dL — ABNORMAL HIGH (ref 65–99)
Glucose-Capillary: 164 mg/dL — ABNORMAL HIGH (ref 65–99)

## 2017-03-11 MED ORDER — CHLORHEXIDINE GLUCONATE 0.12 % MT SOLN
OROMUCOSAL | Status: AC
  Filled 2017-03-11: qty 15

## 2017-03-11 MED ORDER — LIDOCAINE HCL 1 % IJ SOLN
INTRAMUSCULAR | Status: AC
  Filled 2017-03-11: qty 20

## 2017-03-11 MED ORDER — LIDOCAINE HCL 1 % IJ SOLN
INTRAMUSCULAR | Status: DC | PRN
  Administered 2017-03-11: 10 mL

## 2017-03-11 NOTE — Progress Notes (Signed)
[facility] Progress Note   Subjective: no change overnight, not responding to commands.  Family met with [facility] team yesterday.   Objective Vitals:   03/11/17 0746 03/11/17 0800 03/11/17 0822 03/11/17 1121  BP: 117/86 119/83  122/78  Pulse: (!) 109 (!) 110 (!) 129 (!) 114  Resp: (!) 24 (!) 22 (!) 25 (!) 28  Temp:   99.8 F (37.7 C)   TempSrc:   Oral   SpO2: 100% 100% 100% 100%  Weight:   93 kg (205 lb)   Height:       Physical Exam General: chronically ill appearing female in NAD. Eyes open, not tracking objects.  Heart: W0,J8, 2/^ systolic M. No JVD. HR 120s Lungs: trach to vent bilateral breath sounds-few coarse upper airway BS.  Abdomen: active BS Feeding tube present. Extremities: Bilateral heel protectors. No LE edema.  Dialysis Access: LUA AVF + bruit faint in arterial portion of AVF  Dialysis: [facility] TTS   4h 68mn  F200   114kg  2/2 bath  LUA AVF  Hep none - Iron Sucrose (Venofer) 50 mg IVP During Dialysis 1X Week  - Mircera 75 mcg IV q 2 weeks ( last on 02/05/17) - Vitamin D (Calcitriol) Oral0.75 mcg Po q hd    Assess: 1. Cardiac Arrest 05/19 with anoxic brain injury. TEE negative for endocarditis.  2. VDRF: Trach to vent, full support this AM Fio2 30%.  3. L foot Osteomyelitis: Recommended amputation but pt refusing. Being followed per ID/Ortho.  4. ESRD - HD TTS, B / Cr better today 5. Anemia - HGB 8.4. Cont ESA. No Fe D/T osteo.  6. Secondary hyperparathyroidism - Ca 9.2 Phos 3.9. Cont Ca Carbonate.  7. HTN/volume - BP good, no vol excess one xam, metopXL 25/ d 8. Nutrition: Albumin 2.2 On tube feeding prostat/nepro  9. DM: per primary 10. NICM H/O diastolic/systolic HF. Volume controlled.   Plan - HD Thursday, no fluid off  RKelly SplinterMD CMontarapager [phone]  03/10/2017, 12:55 PM    Additional Objective Labs: Basic Metabolic Panel:  Recent Labs Lab 03/07/17 0726 03/10/17 0722 03/11/17 0842  NA 140 142  140  K 3.1* 3.2* 4.0  CL 98* 101 98*  CO2 _0 GLUCOSE 144* 173* 175*  BUN 82* 131* 74*  CREATININE 5.14* 6.92* 4.65*  CALCIUM 9.2 9.6 9.3  PHOS 3.9 4.4 3.4   Liver Function Tests:  Recent Labs Lab 03/07/17 0726 03/10/17 0722 03/11/17 0842  ALBUMIN 2.2* 2.2* 2.5*   No results for input(s): LIPASE, AMYLASE in the last 168 hours. CBC:  Recent Labs Lab 03/05/17 0242 03/07/17 0725 03/10/17 0721 03/11/17 0842  WBC 26.7* 24.4* 33.8* 28.5*  NEUTROABS  --   --   --  23.0*  HGB 9.1* 8.4* 8.9* 9.1*  HCT 30.2* 28.5* 29.9* 30.5*  MCV 97.1 97.3 96.8 97.4  PLT 693* 559* 460* 377   Blood Culture    Component Value Date/Time   SDES BLOOD RIGHT HAND 02/27/2017 1049   SPECREQUEST IN PEDIATRIC BOTTLE Blood Culture adequate volume 02/27/2017 1049   CULT NO GROWTH 5 DAYS 02/27/2017 1049   REPTSTATUS 03/04/2017 FINAL 02/27/2017 1049    Cardiac Enzymes: No results for input(s): CKTOTAL, CKMB, CKMBINDEX, TROPONINI in the last 168 hours. CBG:  Recent Labs Lab 03/10/17 1856 03/10/17 2042 03/11/17 0027 03/11/17 0510 03/11/17 0830  GLUCAP 180* 178* 189* 229* 156*   Iron Studies: No results for input(s): IRON, TIBC, TRANSFERRIN,  FERRITIN in the last 72 hours. _0 @ Studies/Results: Dg Chest Port 1 View  Result Date: 03/11/2017 CLINICAL DATA:  Leukocytosis. EXAM: PORTABLE CHEST 1 VIEW COMPARISON:  03/03/2017 FINDINGS: Patient is rotated to the left. The cardio pericardial silhouette is enlarged. Slight improvement in left base aeration. Tracheostomy tube remains in place. A feeding tube passes into the stomach although the distal tip position is not included on the film. The visualized bony structures of the thorax are intact. Telemetry leads overlie the chest. IMPRESSION: Persistent enlargement of the cardiopericardial silhouette without evidence for overt pulmonary edema or substantial pleural effusion. Electronically Signed   By: Misty Stanley M.D.   On: 03/11/2017  08:37   Medications: . sodium chloride 10 mL/hr at 03/03/17 1700  . sodium chloride 20 mL/hr at 03/07/17 0413  . sodium chloride    . sodium chloride     . calcium carbonate (dosed in mg elemental calcium)  2,000 mg of elemental calcium Oral TID  . chlorhexidine gluconate (MEDLINE KIT)  15 mL Mouth Rinse BID  . darbepoetin (ARANESP) injection - DIALYSIS  200 mcg Intravenous Q Tue-HD  . feeding supplement (NEPRO CARB STEADY)  1,000 mL Per Tube Q24H  . feeding supplement (PRO-STAT SUGAR FREE 64)  60 mL Per Tube QID  . [name]'s butt cream   Topical QID  . heparin  5,000 Units Subcutaneous Q8H  . insulin aspart  0-15 Units Subcutaneous Q4H  . mouth rinse  15 mL Mouth Rinse QID  . metoprolol succinate  25 mg Oral QHS  . multivitamin  1 tablet Oral QHS  . pantoprazole sodium  40 mg Per Tube Q24H

## 2017-03-11 NOTE — Evaluation (Signed)
Speech Language Pathology Evaluation Patient Details Name: [name] MRN: [id number] DOB: Dec 06, 1980 Today's Date: 03/11/2017 Time: 2956-2130 SLP Time Calculation (min) (ACUTE ONLY): 24 min  Problem List:  Patient Active Problem List   Diagnosis Date Noted  . Goals of care, counseling/discussion   . Tachycardia   . DNR (do not resuscitate) discussion 03/04/2017  . Palliative care by specialist 03/04/2017  . Protein calorie malnutrition (HCC)   . Acute on chronic respiratory failure with hypoxemia (HCC)   . Bacteremia   . Encounter for central line placement   . Elevated liver enzymes   . Jaundice   . Wound dehiscence   . Coma (HCC)   . Osteomyelitis (HCC) 02/14/2017  . Pressure injury of skin 02/14/2017  . Cardiac arrest, cause unspecified (HCC)   . Dehiscence of amputation stump (HCC)   . Status post transmetatarsal amputation of left foot (HCC) 10/21/2016  . Status post transmetatarsal amputation of right foot (HCC) 10/08/2016  . 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 Diabetic osteomyelitis (HCC)   . 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 Left foot infection 06/20/2016  . ESRD (end stage renal disease) (HCC) 06/20/2016  . Diabetic foot ulcer (HCC) 06/20/2016  . Chronic pain syndrome 02/24/2016  . Normal coronary arteries 10/12/2015  . NSTEMI- type 2, Troponin 11.2 05/11/2015  . Acute respiratory failure (HCC) 05/10/2015  . History of arthroscopy of right shoulder-05/10/15 05/10/2015  . CKD (chronic kidney disease) stage 5, GFR less than 15 ml/min (HCC) 09/21/2014  . Unspecified asthma(493.90) 10/25/2013  . Acute combined systolic and diastolic heart failure (HCC) 09/21/2013  . Non-ischemic cardiomyopathy - by echo 8/16- EF 35-40% 09/16/2013  . Morbid obesity-  09/16/2013  . 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) 09/15/2013  . Normocytic anemia 09/15/2013  . Lower extremity edema  09/15/2013  . Hyperlipidemia   . Hypertension    Past Medical History:  Past Medical History:  Diagnosis Date  . Anemia   . Arthritis    "left hand" (09/15/2013)  . Asthma   . CHF (congestive heart failure) (HCC)   . Chronic bronchitis (HCC)    "just about q yr" (09/15/2013)  . Chronic kidney disease    "low kidney function" (09/15/2013) ,  T/Th/Sa  . COPD (chronic obstructive pulmonary disease) (HCC)   . Coronary artery disease   . Hyperlipidemia   . Hypertension   . Migraine    "get them alot" (09/15/2013)  . Myocardial infarction [facility]) 04/2015   NSTEMI  . Normal coronary arteries    by cardiac catheterization 09/20/13  . Peripheral vascular disease (HCC)   . Pneumonia    "couple times; have it now" (09/15/2013)  . PONV (postoperative nausea and vomiting)   . Restless legs   . Shortness of breath    "just recently; related to the pneumonia" (09/15/2013)  . Type 1 diabetes mellitus (HCC)    type 2   Past Surgical History:  Past Surgical History:  Procedure Laterality Date  . AMPUTATION Right 06/25/2016   Procedure: Amputation Right Great Toe at the Metatarsophalangeal Joint;  Surgeon: Nadara Mustard, MD;  [facility] OR;  Service: Orthopedics;  Laterality: Right;  . AMPUTATION Bilateral 10/08/2016   Procedure: Bilateral Transmetatarsal Amputation;  Surgeon: Nadara Mustard, MD;  Location: [facility] OR;  Service: Orthopedics;  Laterality: Bilateral;  . AV FISTULA PLACEMENT Left 03/27/2015   Procedure: CREATION RADIAL CEPHALIC ARTERIOVENOUS FISTULA;  Surgeon: Cristal Deer  Adele Dan, MD;  Location: [facility] OR;  Service: Vascular;  Laterality: Left;  . AV FISTULA PLACEMENT Left 11/23/2015   Procedure:  LEFT ARM BASILIC VEIN TRANSPOSITION  ;  Surgeon: Chuck Hint, MD;  Location: [facility] OR;  Service: Vascular;  Laterality: Left;  . CESAREAN SECTION  1999; 2006  . CORONARY ANGIOGRAM  09/20/2013   Procedure: CORONARY ANGIOGRAM;  Surgeon: Runell Gess, MD;  Location: [facility] CATH LAB;   Service: Cardiovascular;;  . FINGER SURGERY Left 1985   3rd and 4th digits reconstructed after cut off" (09/15/2013)  . PERIPHERAL VASCULAR CATHETERIZATION N/A 09/05/2016   Procedure: Abdominal Aortogram;  Surgeon: Sherren Kerns, MD;  Location: [facility] INVASIVE CV LAB;  Service: Cardiovascular;  Laterality: N/A;  . PERIPHERAL VASCULAR CATHETERIZATION Bilateral 09/05/2016   Procedure: Lower Extremity Angiography;  Surgeon: Sherren Kerns, MD;  Location: [facility] INVASIVE CV LAB;  Service: Cardiovascular;  Laterality: Bilateral;  . PERIPHERAL VASCULAR CATHETERIZATION Right 09/05/2016   Procedure: Peripheral Vascular Balloon Angioplasty;  Surgeon: Sherren Kerns, MD;  Location: [facility] INVASIVE CV LAB;  Service: Cardiovascular;  Laterality: Right;  peroneal and AT  . SHOULDER ARTHROSCOPY WITH BICEPSTENOTOMY Right 05/10/2015   Procedure: RIGHT SHOULDER ARTHROSCOPY WITH BICEPS TENOTOMY, DEBRIDEMENT LABRAL TEAR;  Surgeon: Jones Broom, MD;  Location: [facility] OR;  Service: Orthopedics;  Laterality: Right;  Right shoulder arthroscopy biceps tenotomy, debridement labral tear  . STUMP REVISION Left 01/21/2017   Procedure: . Revision Left Transmetatarsal Amputation;  Surgeon: Nadara Mustard, MD;  Location: [facility] OR;  Service: Orthopedics;  Laterality: Left;  . TONSILLECTOMY  1997  . TUBAL LIGATION  2006   HPI:  [name] a 36 y.o.femalewith a history of ESRD on HD T TH S, COPD/ Asthma , D s/p MI 04/2015, HLD, HTN, CHF, chronic anemia, DM, and a history of L foot partial amputation 09/2016 with revision in 12/2016 for dehiscence, presenting to the ED with worsening LLE pain, swelling, increased drainage at the stump, and chills. ETT 5/19 and trach'd 5/31. Hospital course included cardiac arrest, transfer to ICU   Assessment / Plan / Recommendation Clinical Impression  Pt seen for assessment of cognitive function, in setting of anoxic brain injury and tracheostomy on ventilator. At time of arrival, pt was alert and mother  was present. Mother reported pt "understands" and will give clear Y/N response. Pt was able to demonstrate Y/N with a head nod and turning her head to the left for no. However, her responses in a variety of contexts were less than 50% accurate (giving a Y/N on command, identifying her name, basic biographical information). She was able to able to articulate counting to three with moderate cues, follow one step oral motor commands with better than 80% accuracy, also roll her eyes to express exasperation with the task. However, she was never consistent with functional communication and incorrect responses increased with abstraction of question. SHe was very poorly accurate in basic conversation about her biographical information (where she is from, where she went to school, if she has a pet). 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 While pt is responsive, her sustained attention is severely impaired along with higher level function such as memory and problem solving. If she could verbalize, language of confusion or confabulation could be likely. Will proceed with PMSV trials when pt is on trach collar (apparently they were waiting on completion of dialysis today) for better assessment of cognition.     SLP Assessment  SLP  Recommendation/Assessment: Patient needs continued Speech Lanaguage Pathology Services SLP Visit Diagnosis: Cognitive communication deficit (R41.841)    Follow Up Recommendations  Skilled Nursing facility    Frequency and Duration min 2x/week  2 weeks      SLP Evaluation Cognition  Overall Cognitive Status: Impaired/Different from baseline Arousal/Alertness: Awake/alert Orientation Level: Disoriented to person;Disoriented to place Attention: Focused Focused Attention: Appears intact Sustained Attention: Impaired Sustained Attention Impairment: Verbal basic Behaviors: Restless       Comprehension  Auditory  Comprehension Overall Auditory Comprehension: Impaired Yes/No Questions: Impaired Basic Biographical Questions: 26-50% accurate Basic Immediate Environment Questions: 25-49% accurate Commands: Impaired One Step Basic Commands: 25-49% accurate Interfering Components: Attention Reading Comprehension Reading Status: Impaired Word level: Impaired Sentence Level: Not tested Paragraph Level: Not tested Functional Environmental (signs, name badge): Not tested    Expression Verbal Expression Overall Verbal Expression: Impaired Automatic Speech: Counting Level of Generative/Spontaneous Verbalization: Word;Phrase Repetition: Impaired Level of Impairment: Word level Written Expression Dominant Hand: Right   Oral / Motor  Oral Motor/Sensory Function Overall Oral Motor/Sensory Function: Within functional limits Motor Speech Overall Motor Speech:  (Unable to assess)   GO                   Harlon Ditty, MA CCC-SLP [id number]  Claudine Mouton 03/11/2017, 1:36 PM

## 2017-03-11 NOTE — Progress Notes (Signed)
6/13: CSW received call from patient's sister, Wandra Mannan [phone]). She requested that we search for a ventilator facility in [place] or even [place] instead of [place] due to her mother not being able to go to [place] with the patient. CSW explained that the hospital does not currently have other facility options for ventilator dialysis.   6/12: CSW and RNCM spoke with patient's mother in the room and sister, Wandra Mannan (via phone) regarding discharge planning options. CSW explained that if family decided to go to SNF, then the only option would be [facility] in [place]. The other option would be to proceed with hospice.   Osborne Casco Ledford Goodson LCSWA [phone]

## 2017-03-11 NOTE — Progress Notes (Signed)
Discussed g-tube case today with IR MD covering [facility] tomorrow for possible g-tube placement.  We are aware that her WBC will likely be persistently elevated secondary to her gangrene and inflammatory process.  Her fevers are likely central in origin as her cultures are all negative.  The mother has arranged a meeting with palliative care on Friday which will include the patient's sister as well.  The mother has a history of CVA and has difficulty understanding and processing all that is going on according to palliative care.  We will plan to await this meeting and if the family chooses to pursue aggressive measures and wants a g-tube then we can plan to do this early next week with the understanding that her WBC and fevers are not likely to resolve anytime soon.  She will require barium to be given as her anatomy is not ideal for percutaneous g-tube placement.  [name] E 1:39 PM 03/11/2017

## 2017-03-11 NOTE — Progress Notes (Signed)
Pt. Was transported to IR & back to [facility]M06 without any complications.

## 2017-03-11 NOTE — Progress Notes (Signed)
PROGRESS NOTE        PATIENT DETAILS Name: [name] Age: 36 y.o. Sex: female Date of Birth: 09-04-1981 Admit Date: 02/14/2017 Admitting Physician Lady Deutscher, MD SAY:[id number], Helene Kelp, FNP  Note-care assumed by me on 6/13  Brief Narrative: Patient is a 37 y.o. 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 She was subsequently found to be unresponsive in her room on 5/19 after getting IV Dilaudid-she was found to be in asystole, CPR was started, and ROSC in around 9 mintues, she was then transferred to the intensive care unit, and subsequently underwent cooling and then rewarming. She was unable to be liberated off the ventilator, and as a result underwent tracheostomy on 5/31. Has been evaluated by neurology during this hospital stay, per neurology chances of meaningful neurological recovery is dismal. Hospital course has now been complicated by persistent leukocytosis, respiratory failure, ongoing wound dehiscence with dry gangrene. See below for further details   Subjective: Follows some commands-but encephalopathic.  Per RN-no diarrhea.  Assessment/Plan: Acute hypoxemic respiratory failure in a setting of cardiac arrest: Continue to require full ventilator support, tracheostomy in place-pulmonary following   Cardiac arrest on 5/19 with anoxic brain injury: Tracheostomy in place, requires PEG tube placement-IR planning on PEG tube placement when leukocytosis improves. Evaluated by neurology on 5/29, felt to have poor prognosis for meaningful neurological recovery.  Opens eyes to verbal commands-seems to occasionally follow simple commands at times, upon reviewing prior documentation-it appears that she has had no significant improvement.  Systemic inflammatory response syndrome: Continues to have sinus tachycardia,intermittent low-grade fever (felt to be central fever by ID) and significant leukocytosis. Has completed a course of antimicrobial therapy, given persistent leukocytosis-repeat blood cultures, chest x-ray today. I suspect that leukocytosis is probably due to left foot stump wound with dry gangrene-for now continue to monitor off antimicrobial therapy.  Left trans-metatarsal stump wound dehiscence with gangrene: Being followed by orthopedics-per Dr. Miguel Dibble is mostly dry-and recommends that we continue to monitor and proceed with amputation if patient develops signs of infection. With persistent leukocytosis-repeating blood cultures-if no further sources of infection becomes evident-may need to revisit this issue at some point-although on my exam, I do not see any gross purulence from the left transmetatarsal wound. Per prior documentation, patient had refused to proceed with a BKA in the outpatient setting when advised by orthopedic surgeon.  ESRD: Nephrology following if an HD TTS.  Anemia: Likely secondary to chronic disease-probably worsened by acute illness. No signs of bleeding. Follow CBC.  DM-2: CBGs stable, continue SSI.  Chronic systolic heart failure/nonischemic cardiomyopathy (EF 30-35% by TEE on 6/4): Volume status stable-being managed with dialysis. Continue metoprolol.  Pericardial effusion: Seen on TEE-with no evidence of tamponade pathophysiology. Repeat echo at some point in the next few weeks.  Moderate protein calorie malnutrition: Continue NG tube feedings  Telemetry (independently reviewed): Sinus tachycardia  Studies: Lower extremity ultrasound 5/18 >> no evidence of DVT CT head 5/19 >> normal exam Echo 5/20 >> EF  30-35%, increased LVF. Diffuse hypokinesis, akinesis of basilar mid-inferior  myocardium, grade 2 diastolic dysfxn  EEG 7/51 >> finding c/w mod to severe global cerebral dysfxn. C/w anoxic injury given clinical course  LE [place] 5/21 >> negative MRI brain 5/24 >> ?thrombosed cortical vein, otherwise normal TEE 6/4 >> mild MR, normal AV, mild TR, mild PR, EF 30-35%, diffuse hypokinesis, no thrombus, no PFO, normal RV, moderate pericardial effusion with synechia suggesting some chronicity, no vegetations   Echo (reviewed):EF 30-35% on TEE done on 6/4  Morning labs/Imaging ordered: yes  DVT Prophylaxis: Prophylactic Heparin   Code Status: Full code   Family Communication: Mother at bedside  Disposition Plan: Remain inpatient in SDU  Antimicrobial agents: Anti-infectives    Start     Dose/Rate Route Frequency Ordered Stop   03/02/17 1200  cefTRIAXone (ROCEPHIN) 2 g in dextrose 5 % 50 mL IVPB     2 g 100 mL/hr over 30 Minutes Intravenous Every 24 hours 03/02/17 0810 03/06/17 1358   03/02/17 1200  vancomycin (VANCOCIN) IVPB 1000 mg/200 mL premix     1,000 mg 200 mL/hr over 60 Minutes Intravenous Every M-W-F (Hemodialysis) 03/02/17 0815 03/02/17 1457   03/02/17 1200  metroNIDAZOLE (FLAGYL) IVPB 500 mg     500 mg 100 mL/hr over 60 Minutes Intravenous Every 8 hours 03/02/17 0948 03/06/17 2230   02/26/17 1200  vancomycin (VANCOCIN) IVPB 1000 mg/200 mL premix     1,000 mg 200 mL/hr over 60 Minutes Intravenous Every T-Th-Sa (Hemodialysis) 02/25/17 1151 03/05/17 1325   02/25/17 1200  vancomycin (VANCOCIN) 2,000 mg in sodium chloride 0.9 % 500 mL IVPB     2,000 mg 250 mL/hr over 120 Minutes Intravenous  Once 02/25/17 1149 02/25/17 1449   02/25/17 0900  cefTRIAXone (ROCEPHIN) 2 g in dextrose 5 % 50 mL IVPB  Status:  Discontinued     2 g 100 mL/hr over 30 Minutes Intravenous Every 24 hours 02/25/17 0814 03/02/17 0810   02/25/17 0900  metroNIDAZOLE (FLAGYL) IVPB 500 mg  Status:  Discontinued       500 mg 100 mL/hr over 60 Minutes Intravenous Every 8 hours 02/25/17 0814 03/02/17 0948   02/20/17 1200  vancomycin (VANCOCIN) IVPB 1000 mg/200 mL premix  Status:  Discontinued     1,000 mg 200 mL/hr over 60 Minutes Intravenous Every M-W-F (Hemodialysis) 02/19/17 1506 02/20/17 1403   02/17/17 1800  meropenem (MERREM) 500 mg in sodium chloride 0.9 % 50 mL IVPB  Status:  Discontinued     500 mg 100 mL/hr over 30 Minutes Intravenous Daily-1800 02/17/17 1026 02/25/17 0814   02/17/17 1200  vancomycin (VANCOCIN) IVPB 1000 mg/200 mL premix     1,000 mg 200 mL/hr over 60 Minutes Intravenous Every T-Th-Sa (Hemodialysis) 02/14/17 1758 02/19/17 1612   02/14/17 2200  clindamycin (CLEOCIN) IVPB 900 mg  Status:  Discontinued     900 mg 100 mL/hr over 30 Minutes Intravenous Every 8 hours 02/14/17 1106 02/14/17 1928   02/14/17 2000  clindamycin (CLEOCIN) IVPB 900 mg  Status:  Discontinued     900 mg 100 mL/hr over 30 Minutes Intravenous Every 8 hours 02/14/17 1928 02/16/17 1047   02/14/17 1915  piperacillin-tazobactam (ZOSYN) IVPB 3.375 g  Status:  Discontinued     3.375 g 100 mL/hr over 30 Minutes Intravenous Every 12 hours 02/14/17 1801 02/17/17 1026   02/14/17 1845  vancomycin (VANCOCIN) 2,000 mg in sodium chloride 0.9 % 500 mL IVPB     2,000 mg 250 mL/hr over 120 Minutes Intravenous  Once 02/14/17 1758 02/14/17 2107  02/14/17 1115  clindamycin (CLEOCIN) IVPB 900 mg  Status:  Discontinued     900 mg 100 mL/hr over 30 Minutes Intravenous  Once 02/14/17 1106 02/14/17 2236   02/14/17 0945  clindamycin (CLEOCIN) IVPB 600 mg     600 mg 100 mL/hr over 30 Minutes Intravenous  Once 02/14/17 0931 02/14/17 1013      Procedures: ETT 5/19 >> 5/31 Lt IJ CVL 5/19 >> out Trach 5/31 >>  TEE 6/4 >> mild MR, normal AV, mild TR, mild PR, EF 30-35%, diffuse hypokinesis, no thrombus, no PFO, normal RV, moderate pericardial effusion with synechia suggesting some chronicity, no vegetations    CONSULTS:  Infectious disease:  Neurology  Gastroenterology  Wound care  PCCM  Nephrology  Orthopedics  Time spent: 30 minutes-Greater than 50% of this time was spent in counseling, explanation of diagnosis, planning of further management, and coordination of care.  MEDICATIONS: Scheduled Meds: . calcium carbonate (dosed in mg elemental calcium)  2,000 mg of elemental calcium Oral TID  . chlorhexidine gluconate (MEDLINE KIT)  15 mL Mouth Rinse BID  . darbepoetin (ARANESP) injection - DIALYSIS  200 mcg Intravenous Q Tue-HD  . feeding supplement (NEPRO CARB STEADY)  1,000 mL Per Tube Q24H  . feeding supplement (PRO-STAT SUGAR FREE 64)  60 mL Per Tube QID  . [name]'s butt cream   Topical QID  . heparin  5,000 Units Subcutaneous Q8H  . insulin aspart  0-15 Units Subcutaneous Q4H  . mouth rinse  15 mL Mouth Rinse QID  . metoprolol succinate  25 mg Oral QHS  . multivitamin  1 tablet Oral QHS  . pantoprazole sodium  40 mg Per Tube Q24H   Continuous Infusions: . sodium chloride 10 mL/hr at 03/03/17 1700  . sodium chloride 20 mL/hr at 03/07/17 0413  . sodium chloride    . sodium chloride     PRN Meds:.sodium chloride, sodium chloride, acetaminophen (TYLENOL) oral liquid 160 mg/5 mL, albuterol, fentaNYL (SUBLIMAZE) injection, metoprolol tartrate   PHYSICAL EXAM: Vital signs: Vitals:   03/11/17 0746 03/11/17 0800 03/11/17 0822 03/11/17 1121  BP: 117/86 119/83  122/78  Pulse: (!) 109 (!) 110 (!) 129 (!) 114  Resp: (!) 24 (!) 22 (!) 25 (!) 28  Temp:   99.8 F (37.7 C)   TempSrc:   Oral   SpO2: 100% 100% 100% 100%  Weight:   93 kg (205 lb)   Height:       Filed Weights   03/10/17 1156 03/11/17 0530 03/11/17 0822  Weight: 92.3 kg (203 lb 7.8 oz) 91.2 kg (201 lb) 93 kg (205 lb)   Body mass index is 30.72 kg/m.   General appearance:Awake-Chronically ill appearing-follow simple commands Eyes:no scleral icterus.Pink conjunctiva HEENT: Atraumatic and  Normocephalic Neck: Tracheostomy in place supple Resp:Good air entry bilaterally, transmitted upper airway sounds CVS: S1 S2 regular, tachycardic GI: Bowel sounds present, Non tender and not distended with no gaurding, rigidity or rebound Extremities: B/L Lower Ext shows no edema. Right transmetatarsal stump intact, left transmetatarsal stump with gangrene-no purulent drainage Neurology: Follow simple commands-able to squeeze my hands-unable to assess further Musculoskeletal:No digital cyanosis Skin:No Rash, warm and dry Wounds: See above  I have personally reviewed following labs and imaging studies  LABORATORY DATA: CBC:  Recent Labs Lab 03/05/17 0242 03/07/17 0725 03/10/17 0721 03/11/17 0842  WBC 26.7* 24.4* 33.8* 28.5*  NEUTROABS  --   --   --  23.0*  HGB 9.1* 8.4* 8.9* 9.1*  HCT 30.2*  28.5* 29.9* 30.5*  MCV 97.1 97.3 96.8 97.4  PLT 693* 559* 460* 559    Basic Metabolic Panel:  Recent Labs Lab 03/05/17 0242 03/07/17 0726 03/10/17 0722 03/11/17 0842  NA 138 140 142 140  K 3.5 3.1* 3.2* 4.0  CL 95* 98* 101 98*  CO2 _0 GLUCOSE 173* 144* 173* 175*  BUN 91* 82* 131* 74*  CREATININE 4.75* 5.14* 6.92* 4.65*  CALCIUM 9.4 9.2 9.6 9.3  PHOS 3.3 3.9 4.4 3.4    GFR: Estimated Creatinine Clearance: 20 mL/min (A) (by C-G formula based on SCr of 4.65 mg/dL (H)).  Liver Function Tests:  Recent Labs Lab 03/05/17 0242 03/07/17 0726 03/10/17 0722 03/11/17 0842  ALBUMIN 2.1* 2.2* 2.2* 2.5*   No results for input(s): LIPASE, AMYLASE in the last 168 hours. No results for input(s): AMMONIA in the last 168 hours.  Coagulation Profile: No results for input(s): INR, PROTIME in the last 168 hours.  Cardiac Enzymes: No results for input(s): CKTOTAL, CKMB, CKMBINDEX, TROPONINI in the last 168 hours.  BNP (last 3 results) No results for input(s): PROBNP in the last 8760 hours.  HbA1C: No results for input(s): HGBA1C in the last 72 hours.  CBG:  Recent  Labs Lab 03/10/17 1856 03/10/17 2042 03/11/17 0027 03/11/17 0510 03/11/17 0830  GLUCAP 180* 178* 189* 229* 156*    Lipid Profile: No results for input(s): CHOL, HDL, LDLCALC, TRIG, CHOLHDL, LDLDIRECT in the last 72 hours.  Thyroid Function Tests: No results for input(s): TSH, T4TOTAL, FREET4, T3FREE, THYROIDAB in the last 72 hours.  Anemia Panel: No results for input(s): VITAMINB12, FOLATE, FERRITIN, TIBC, IRON, RETICCTPCT in the last 72 hours.  Urine analysis:    Component Value Date/Time   COLORURINE AMBER (A) 02/14/2017 2111   APPEARANCEUR HAZY (A) 02/14/2017 2111   LABSPEC 1.015 02/14/2017 2111   PHURINE 5.0 02/14/2017 2111   GLUCOSEU NEGATIVE 02/14/2017 2111   HGBUR SMALL (A) 02/14/2017 2111   BILIRUBINUR NEGATIVE 02/14/2017 2111   KETONESUR NEGATIVE 02/14/2017 2111   PROTEINUR 30 (A) 02/14/2017 2111   UROBILINOGEN 0.2 09/21/2014 1908   NITRITE NEGATIVE 02/14/2017 2111   LEUKOCYTESUR TRACE (A) 02/14/2017 2111    Sepsis Labs: Lactic Acid, Venous    Component Value Date/Time   LATICACIDVEN 4.6 ([place]) 02/14/2017 1807    MICROBIOLOGY: Recent Results (from the past 240 hour(s))  C difficile quick scan w PCR reflex     Status: None   Collection Time: 03/04/17  2:17 PM  Result Value Ref Range Status   C Diff antigen NEGATIVE NEGATIVE Final   C Diff toxin NEGATIVE NEGATIVE Final   C Diff interpretation No C. difficile detected.  Final    RADIOLOGY STUDIES/RESULTS: Ct Abdomen Wo Contrast  Result Date: 03/02/2017 CLINICAL DATA:  Preop for gastrostomy to EXAM: CT ABDOMEN WITHOUT CONTRAST TECHNIQUE: Multidetector CT imaging of the abdomen was performed following the standard protocol without IV contrast. COMPARISON:  None. FINDINGS: Lower chest: Bibasilar dependent and medial consolidation. Large pericardial effusion. Hepatobiliary: The left lobe of the liver is prominent and anterior to the stoma. The gallbladder is not clearly visualized. It may either be decompressed  or absent due to cholecystectomy. Pancreas: Unremarkable Spleen: Unremarkable Adrenals/Urinary Tract: Kidneys and adrenal glands are within normal limits. Stomach/Bowel: The stomach is positioned deep to the liver and colon. Gastrostomy tube placement may be problematic. No evidence of small-bowel obstruction. Feeding tube tip is in the proximal duodenum. Vascular/Lymphatic: Small para-aortic lymph nodes. Atherosclerotic vascular calcifications  are noted. No evidence of aortic aneurysm. Other: No free-fluid. Musculoskeletal: No vertebral compression deformity. IMPRESSION: The stomach is positioned deep to a prominent left lobe of the liver as well as the transverse colon. Gas is distension of the stomach and barium opacification of the transverse colon will be essential during the procedure. Bibasilar pulmonary consolidation. Large pericardial effusion. Electronically Signed   By: Marybelle Killings M.D.   On: 03/02/2017 07:18   Dg Chest 2 View  Result Date: 02/14/2017 CLINICAL DATA:  Hypoxia. Pt came to ED with left foot pain post amputation of toes. Hx diabetes, PNA, COPD, HTN, MI, CAD, CKD, CHF. EXAM: CHEST  2 VIEW COMPARISON:  04/22/2016 FINDINGS: Moderate enlargement of the cardiopericardial silhouette, stable. No mediastinal or hilar masses. No evidence of adenopathy. Clear lungs.  No pleural effusion.  No pneumothorax. Skeletal structures are unremarkable. IMPRESSION: 1. No acute cardiopulmonary disease. 2. Stable moderate cardiomegaly. Electronically Signed   By: Lajean Manes M.D.   On: 02/14/2017 09:26   Ct Head Wo Contrast  Result Date: 02/14/2017 CLINICAL DATA:  Cardiac arrest and, EXAM: CT HEAD WITHOUT CONTRAST TECHNIQUE: Contiguous axial images were obtained from the base of the skull through the vertex without intravenous contrast. COMPARISON:  None. FINDINGS: Brain: No mass lesion, intraparenchymal hemorrhage or extra-axial collection. No evidence of acute cortical infarct. Brain parenchyma and  CSF-containing spaces are normal for age. Mildly asymmetric hypoattenuation of the right temporal lobe relative to the left is likely artifactual Vascular: No hyperdense vessel or unexpected calcification. Skull: Normal visualized skull base, calvarium and extracranial soft tissues. Sinuses/Orbits: No sinus fluid levels or advanced mucosal thickening. No mastoid effusion. Normal orbits. IMPRESSION: Normal head CT. Electronically Signed   By: Ulyses Jarred M.D.   On: 02/14/2017 22:23   Mr Brain Wo Contrast  Result Date: 02/18/2017 CLINICAL DATA:  Anoxic brain injury status post cardiac arrest EXAM: MRI HEAD WITHOUT CONTRAST TECHNIQUE: Multiplanar, multiecho pulse sequences of the brain and surrounding structures were obtained without intravenous contrast. IV contrast could not be administered due to the patient's poor renal function. COMPARISON:  Head CT 02/14/2017 FINDINGS: Brain: The midline structures are normal. There is no focal diffusion restriction to indicate acute infarct. The brain parenchymal signal is normal and there is no mass lesion. There is a focal susceptibility abnormality at the left insula (series 12, image 55). Brain volume is normal for age without age-advanced or lobar predominant atrophy. The dura is normal and there is no extra-axial collection. Vascular: Major intracranial arterial and venous sinus flow voids are preserved. Skull and upper cervical spine: The visualized skull base, calvarium, upper cervical spine and extracranial soft tissues are normal. Sinuses/Orbits: No fluid levels or advanced mucosal thickening. No mastoid effusion. Normal orbits. IMPRESSION: 1. Focus of susceptibility in the left insula may indicate a thrombosed cortical vein. The location does not correspond to a large artery. 2. No associated ischemia or hemorrhage.  Otherwise normal brain. Electronically Signed   By: Ulyses Jarred M.D.   On: 02/18/2017 14:55   Dg Chest Port 1 View  Result Date:  03/11/2017 CLINICAL DATA:  Leukocytosis. EXAM: PORTABLE CHEST 1 VIEW COMPARISON:  03/03/2017 FINDINGS: Patient is rotated to the left. The cardio pericardial silhouette is enlarged. Slight improvement in left base aeration. Tracheostomy tube remains in place. A feeding tube passes into the stomach although the distal tip position is not included on the film. The visualized bony structures of the thorax are intact. Telemetry leads overlie the chest. IMPRESSION: Persistent  enlargement of the cardiopericardial silhouette without evidence for overt pulmonary edema or substantial pleural effusion. Electronically Signed   By: Misty Stanley M.D.   On: 03/11/2017 08:37   Dg Chest Port 1 View  Result Date: 03/03/2017 CLINICAL DATA:  Respiratory failure. EXAM: PORTABLE CHEST 1 VIEW COMPARISON:  03/01/2017 . FINDINGS: Tracheostomy tube and feeding tube in stable position. Cardiomegaly with mild diffuse interstitial prominence suggesting mild CHF. Persistent atelectasis and consolidation left lower lobe. Small left pleural effusion cannot be excluded. No pneumothorax. IMPRESSION: 1. Tracheostomy tube and feeding tube in stable position. 2. Cardiomegaly with diffuse mild interstitial prominence suggesting mild CHF. 3. Persistent left lower lobe atelectasis and consolidation. Small left pleural effusion cannot be excluded . Electronically Signed   By: Marcello Moores  Register   On: 03/03/2017 07:09   Dg Chest Port 1 View  Result Date: 03/01/2017 CLINICAL DATA:  Respiratory failure EXAM: PORTABLE CHEST 1 VIEW COMPARISON:  02/27/2017 FINDINGS: 0546 hours. Patient rotated to the left. Tracheostomy tube remains in place. A feeding tube passes into the stomach although the distal tip position is not included on the film. The cardio pericardial silhouette is enlarged. Left base collapse/consolidation again noted. There is pulmonary vascular congestion without overt pulmonary edema. IMPRESSION: Rotated film with cardiomegaly and vascular  congestion. Persistent left base collapse/ consolidation. Electronically Signed   By: Misty Stanley M.D.   On: 03/01/2017 07:43   Dg Chest Port 1 View  Result Date: 02/27/2017 CLINICAL DATA:  Hypoxia. EXAM: PORTABLE CHEST 1 VIEW COMPARISON:  02/26/2017. FINDINGS: Tracheostomy to in stable position. Stable cardiomegaly. Low lung volumes. Progressive left lower lobe atelectasis and infiltrate. Small left pleural effusion cannot be excluded . IMPRESSION: 1. Tracheostomy tube in stable position. 2. Progressive left lower lobe atelectasis and infiltrate. Small left pleural effusion cannot be excluded . Electronically Signed   By: Marcello Moores  Register   On: 02/27/2017 07:01   Dg Chest Port 1 View  Result Date: 02/26/2017 CLINICAL DATA:  Status post tracheostomy placement. EXAM: PORTABLE CHEST 1 VIEW COMPARISON:  Earlier today. FINDINGS: The endotracheal tube has been removed and replaced with a tracheostomy tube in satisfactory position. The nasogastric tube and esophageal probe have been removed. The left jugular catheter has been removed. No pneumothorax. Grossly stable enlarged cardiac silhouette. Left lower lobe opacity with improvement laterally since 02/25/2017. Clear right lung. Unremarkable bones. IMPRESSION: 1. Tracheostomy tube in satisfactory position. 2. Left lower lobe atelectasis or pneumonia with mild improvement since 2 days ago. 3. Stable cardiomegaly. Electronically Signed   By: Claudie Revering M.D.   On: 02/26/2017 16:02   Dg Chest Port 1 View  Result Date: 02/26/2017 CLINICAL DATA:  Hypoxia.  Shortness of breath. EXAM: PORTABLE CHEST 1 VIEW COMPARISON:  02/25/2017. FINDINGS: Endotracheal tube, NG tube, left IJ line esophageal probe in stable position. Cardiomegaly with bilateral pulmonary interstitial infiltrates consistent with CHF. Left base atelectasis . Small left pleural effusion. Similar findings noted on prior exam. No pneumothorax. IMPRESSION: 1. Lines and tubes in stable position. 2.  Cardiomegaly with bilateral pulmonary interstitial prominence and small left pleural effusion noted consistent CHF. Left base atelectasis. Similar findings noted on prior exam. Electronically Signed   By: [place]   On: 02/26/2017 07:18   Dg Chest Port 1 View  Result Date: 02/25/2017 CLINICAL DATA:  Respiratory failure, intubated patient. Diabetes, cardiomyopathy, morbid obesity, end-stage renal disease. EXAM: PORTABLE CHEST 1 VIEW COMPARISON:  Portable chest x-ray of Feb 24, 2017 FINDINGS: The lungs are adequately inflated. The  pulmonary interstitial markings are slightly less prominent today. The pulmonary vascularity remains engorged. The cardiac silhouette remains enlarged. Retrocardiac region on the left is slightly less dense. The endotracheal tube tip lies 5.7 cm above the carina. The esophagogastric tube tip projects below the inferior margin of the image. The left internal jugular venous catheter tip projects over the proximal SVC. IMPRESSION: Slight interval improvement in pulmonary interstitial edema. Stable cardiomegaly. Persistent left lower lobe atelectasis or pneumonia. The support tubes are in reasonable position. Electronically Signed   By: David  Martinique M.D.   On: 02/25/2017 07:57   Dg Chest Port 1 View  Result Date: 02/24/2017 CLINICAL DATA:  Respiratory failure EXAM: PORTABLE CHEST 1 VIEW COMPARISON:  Two days ago FINDINGS: Endotracheal tube tip at the clavicular heads. An orogastric tube reaches the stomach. Esophageal thermistor. Left IJ central line with tip at the SVC level. Cardiomegaly and diffuse hazy opacity with cephalized blood flow. No definitive effusion. No pneumothorax. IMPRESSION: 1. Stable positioning of tubes and central line. 2. CHF pattern. Electronically Signed   By: Monte Fantasia M.D.   On: 02/24/2017 07:31   Dg Chest Port 1 View  Result Date: 02/22/2017 CLINICAL DATA:  Respiratory failure EXAM: PORTABLE CHEST 1 VIEW COMPARISON:  02/21/2017 FINDINGS:  Endotracheal tube terminates 5 cm above the carina. Cardiomegaly with mild interstitial edema. Retrocardiac opacity, possibly reflecting a combination of atelectasis and pleural effusion, pneumonia not excluded. No pneumothorax. Left IJ venous catheter terminates at the cavoatrial junction. Enteric tube courses into the stomach. IMPRESSION: Endotracheal tube terminates 5 cm above the carina. Cardiomegaly with mild interstitial edema. Retrocardiac opacity, possibly atelectasis and pleural effusion, pneumonia not excluded. Electronically Signed   By: Julian Hy M.D.   On: 02/22/2017 07:30   Dg Chest Port 1 View  Result Date: 02/21/2017 CLINICAL DATA:  Endotracheal tube. EXAM: PORTABLE CHEST 1 VIEW COMPARISON:  02/19/2017 FINDINGS: Endotracheal tube terminates approximately 5 cm above the carina. Enteric tube courses into the left upper abdomen with tip not imaged. Left jugular catheter terminates over the mid SVC. The cardiac silhouette remains enlarged. Pulmonary vascular congestion and bilateral parenchymal lung opacities have not significantly changed. No large pleural effusion or pneumothorax is identified. IMPRESSION: Unchanged pulmonary edema. Electronically Signed   By: Logan Bores M.D.   On: 02/21/2017 07:42   Dg Chest Port 1 View  Result Date: 02/19/2017 CLINICAL DATA:  36 year old female with respiratory failure. EXAM: PORTABLE CHEST 1 VIEW COMPARISON:  02/18/2017 and prior radiograph FINDINGS: An endotracheal tube with tip 3.5 cm above the carina, left IJ central venous catheter with tip overlying the lower SVC, and NG tube entering the stomach with tip off the field of view again noted. Pulmonary edema has decreased from the prior study. Continued left lower lung atelectasis/ consolidation again noted. There is no evidence of pneumothorax. No other changes are noted. IMPRESSION: Decreased pulmonary edema, otherwise unchanged appearance of the chest. Electronically Signed   By: Margarette Canada  M.D.   On: 02/19/2017 08:03   Dg Chest Port 1 View  Result Date: 02/18/2017 CLINICAL DATA:  Respiratory failure EXAM: PORTABLE CHEST 1 VIEW COMPARISON:  02/17/2017 FINDINGS: Cardiac shadow remains enlarged. Endotracheal tube, nasogastric catheter and esophageal probe are again seen and stable. Left jugular central line is again noted and stable. No pneumothorax is seen. Diffuse vascular congestion and increased parenchymal opacity is noted consistent with progressive CHF. No focal confluent infiltrate is noted. IMPRESSION: Progressive changes of CHF. Electronically Signed   By: Elta Guadeloupe  Lukens M.D.   On: 02/18/2017 08:15   Dg Chest Port 1 View  Result Date: 02/17/2017 CLINICAL DATA:  Pulmonary edema EXAM: PORTABLE CHEST 1 VIEW COMPARISON:  02/16/2017 FINDINGS: Endotracheal tube, NG tube, left jugular central venous catheter are stable. Severe cardiomegaly is stable. Mild vascular congestion is stable. No consolidation. No interstitial edema. IMPRESSION: Stable cardiomegaly and mild vascular congestion. Electronically Signed   By: Marybelle Killings M.D.   On: 02/17/2017 07:35   Dg Chest Port 1 View  Result Date: 02/16/2017 CLINICAL DATA:  Acute respiratory failure. EXAM: PORTABLE CHEST 1 VIEW COMPARISON:  Radiograph of Feb 14, 2017. FINDINGS: Stable cardiomegaly. Endotracheal and nasogastric tubes are unchanged in position. Left internal jugular catheter is unchanged with distal tip in expected position of SVC. No pneumothorax is noted. Lungs are clear. Bony thorax is unremarkable. IMPRESSION: Stable support apparatus.  Stable cardiomegaly. Electronically Signed   By: Marijo Conception, M.D.   On: 02/16/2017 07:30   Dg Chest Port 1 View  Result Date: 02/14/2017 CLINICAL DATA:  Central line placement EXAM: PORTABLE CHEST 1 VIEW COMPARISON:  02/14/2017 at 0913 hours FINDINGS: Endotracheal tube terminates 4.5 cm above the carina. Lungs are clear.  No pleural effusion or pneumothorax. Cardiomegaly. Left IJ venous  catheter terminates at cavoatrial junction. Enteric tube courses into the stomach. Defibrillator pads overlying the left hemithorax. IMPRESSION: Endotracheal tube terminates 4.5 cm above the carina. Left IJ venous catheter terminates at the cavoatrial junction. Electronically Signed   By: Julian Hy M.D.   On: 02/14/2017 18:31   Dg Abd Portable 1v  Result Date: 03/02/2017 CLINICAL DATA:  Encounter for feeding tube placement EXAM: PORTABLE ABDOMEN - 1 VIEW COMPARISON:  Portable exam 1712 hours compared to CT abdomen and pelvis of 03/01/2017 FINDINGS: Feeding tube traverses abdomen with tip projecting over distal antrum near pylorus. Cardiac silhouette appears enlarged. Visualized bowel gas pattern normal. Osseous structures unremarkable. IMPRESSION: Tip of feeding tube projects over distal gastric antrum near pylorus. Electronically Signed   By: Lavonia Dana M.D.   On: 03/02/2017 17:21   Dg Abd Portable 1v  Result Date: 02/27/2017 CLINICAL DATA:  Feeding tube placement EXAM: PORTABLE ABDOMEN - 1 VIEW COMPARISON:  None. FINDINGS: Feeding tube with the tip projecting over the antrum of the stomach. There is no bowel dilatation to suggest obstruction. There is no evidence of pneumoperitoneum, portal venous gas or pneumatosis. There are no pathologic calcifications along the expected course of the ureters. The osseous structures are unremarkable. IMPRESSION: Feeding tube with the tip projecting over the antrum of the stomach. Electronically Signed   By: Kathreen Devoid   On: 02/27/2017 11:05   Dg Foot Complete Left  Result Date: 02/14/2017 CLINICAL DATA:  Pt c/o severe pain and inflammation in left foot. 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 Weeping evident at site of amputation. Pt reports nausea today. Hx diabetes, HTN, CKD. EXAM: LEFT FOOT - COMPLETE 3+ VIEW COMPARISON:  12/29/2016 FINDINGS: Postop changes including resection of the proximal first and fourth metatarsals since prior exam.  Small proximal fragments of the second third and fifth metatarsals are identified. There is some indistinctness of the resection margin of the third and fifth metatarsal fragments suggesting osteomyelitis. There is a soft tissue defect distal to the resection margin seen best on the lateral projection. IMPRESSION: 1. Interval postop changes. Indistinctness of residual third and fifth metatarsal fragments, suggesting osteomyelitis. Electronically Signed   By: Lucrezia Europe M.D.   On: 02/14/2017 08:15  US Abdomen Limited Ruq  Result Date: 02/18/2017 CLINICAL DATA:  Elevated liver function studies. EXAM: US ABDOMEN LIMITED - RIGHT UPPER QUADRANT COMPARISON:  None. FINDINGS: Gallbladder: No gallstones or wall thickening visualized. No sonographic Murphy sign noted by sonographer. Common bile duct: Diameter: 7 mm, normal Liver: Circumscribed hyperechoic lesion with central hypoechoic appearance demonstrated along the dome of the liver measuring 2.8 cm maximal diameter. This is likely to represent a cavernous hemangioma, but the appearance on ultrasound is somewhat atypical. Consider follow-up with elective MRI of the liver for better characterization. IMPRESSION: No evidence of cholelithiasis or cholecystitis. Circumscribed hyperechoic lesion in the dome of the liver measuring 2.8 cm maximal diameter. This is probably a cavernous hemangioma but appearance is somewhat atypical. Suggest elective MRI liver in follow-up for better characterization. Electronically Signed   By: Lucienne Capers M.D.   On: 02/18/2017 02:10     LOS: 25 days   Oren Binet, MD  [facility] Pager:[phone]  If 7PM-7AM, please contact night-coverage [email or web address] Password [facility] 03/11/2017, 11:26 AM

## 2017-03-11 NOTE — Progress Notes (Signed)
Nutrition Follow-up  DOCUMENTATION CODES:   Obesity unspecified  INTERVENTION:    Continue Nepro to goal rate of 15 ml/h with 60 ml Prostat QID   TF regimen providing 1464 kcals, 128 gm protein, 349 ml free water daily  NUTRITION DIAGNOSIS:   Inadequate oral intake related to inability to eat as evidenced by NPO status, ongoing  GOAL:   Provide needs based on ASPEN/SCCM guidelines, met  MONITOR:   Vent status, TF tolerance, Labs, Weight trends, Skin, I & O's  ASSESSMENT:   36 yo Female w/ ESRD d/t poorly controlled DM, bilateral transmetatarsal foot amputations, obesity, HTN, medical non-compliance as well as NICM. Admitted with complaints of left leg pain, found to have radiological evidence of osteomyelitis with wound nonhealing, dehiscence, purulent drainage from the wound. Admitted for IV antibiotics, hemodialysis and possible surgery. Pt with nonhealing L foot wound, amputation recommended.   5/19 cardiac arrest 5/31 trach placed 6/01 Cortrak placed  Patient is currently on ventilator support MV: 12.4 L/min Temp (24hrs), Avg:99.1 F (37.3 C), Min:98.6 F (37 C), Max:99.8 F (37.7 C)  Nepro formula infusing at goal rate of 15 ml/hr via Cortrak small bore feeding tube.  Prostat 60 ml QID. Plan is for PEG tube placement per IR once pt afebrile x 48 hr and WBC is WNL. Palliative Care Team following for goals of care. Labs and medications reviewed. CBG's [id number].  Diet Order:  Diet NPO time specified   Skin:  Left heel DM foot ulcer unstageable to lower heel, DTI to upper heel, MASD groin and sacrum Stage II anus  Last BM:  6/13  Height:   Ht Readings from Last 1 Encounters:  02/14/17 5' 8.5" (1.74 m)   Weight: >>> stable  Wt Readings from Last 1 Encounters:  03/11/17 205 lb (93 kg)   Ideal Body Weight:  63.6 kg  BMI:  Body mass index is 30.72 kg/m.  Estimated Nutritional Needs:   Kcal:  1100-1400  Protein:  >/= 127 grams  Fluid:  per  MD  EDUCATION NEEDS:   No education needs identified at this time  Arthur Holms, RD, LDN Pager #: [phone] After-Hours Pager #: [phone]

## 2017-03-11 NOTE — Progress Notes (Signed)
Daily Progress Note   Patient Name: [name]       Date: 03/11/2017 DOB: 17-Feb-1981  Age: 36 y.o. MRN#: [id number] Attending Physician: Beth Osgood, MD Primary Care Physician: Beth Aly, FNP Admit Date: 02/14/2017  Reason for Consultation/Follow-up: Establishing goals of care  Subjective: Patient gone from room for tunneled cath placement.  I talked with her mother and then called [name] on the phone. [name] (mother) feels that she is seeing improvement in her daughter.  She appreciated the Cognitive evaluation that was started today and is looking forward to passy-muir valve placement so that [name] can speak.    I spoke with [name] who also feels [name] has improved from where she was 1 week ago.  She is expressing herself more and understanding more.    [name] and [name] have decided that they want to give [name] a chance to continue to improve.  They would like for her to receive a PEG and as much therapy as possible.  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  She now has an anoxic brain injury   Length of Stay: 25  Current Medications: Scheduled Meds:  . calcium carbonate (dosed in mg elemental calcium)  2,000 mg of elemental calcium Oral TID  . chlorhexidine gluconate (MEDLINE KIT)  15 mL Mouth Rinse BID  . darbepoetin (ARANESP) injection - DIALYSIS  200 mcg Intravenous Q Tue-HD  . feeding supplement (NEPRO CARB STEADY)  1,000 mL Per Tube Q24H  . feeding supplement (PRO-STAT SUGAR FREE 64)  60 mL Per Tube QID  . [name]'s butt cream   Topical QID  . heparin  5,000 Units Subcutaneous Q8H  . insulin aspart  0-15 Units Subcutaneous Q4H  .  lidocaine      . mouth rinse  15 mL Mouth Rinse QID  . metoprolol succinate  25 mg Oral QHS  . multivitamin  1 tablet Oral QHS  . pantoprazole sodium  40 mg Per Tube Q24H    Continuous Infusions: . sodium chloride 10 mL/hr at 03/03/17 1700  . sodium chloride 20 mL/hr at 03/07/17 0413  . sodium chloride      PRN Meds: sodium chloride, acetaminophen (TYLENOL) oral liquid  160 mg/5 mL, albuterol, fentaNYL (SUBLIMAZE) injection, lidocaine, metoprolol tartrate  Physical Exam       Patient not examined today.  Vital Signs: BP 122/78   Pulse (!) 114   Temp 99.8 F (37.7 C) (Oral)   Resp (!) 28   Ht 5' 8.5" (1.74 m)   Wt 93 kg (205 lb)   LMP  (LMP Unknown) Comment: Patient trached  SpO2 100%   BMI 30.72 kg/m  SpO2: SpO2: 100 % O2 Device: O2 Device: Ventilator O2 Flow Rate: O2 Flow Rate (L/min): 10 L/min  Intake/output summary:  Intake/Output Summary (Last 24 hours) at 03/11/17 1511 Last data filed at 03/11/17 5852  Gross per 24 hour  Intake             2391 ml  Output                0 ml  Net             2391 ml   LBM: Last BM Date: 03/10/17 Baseline Weight: Weight: 108 kg (238 lb) Most recent weight: Weight: 93 kg (205 lb)       Palliative Assessment/Data:    Flowsheet Rows     Most Recent Value  Intake Tab  Referral Department  Hospitalist  Unit at Time of Referral  ICU  Palliative Care Primary Diagnosis  Nephrology  Date Notified  03/03/17  Palliative Care Type  New Palliative care  Reason for referral  Clarify Goals of Care  Date of Admission  02/14/17  Date first seen by Palliative Care  03/04/17  # of days Palliative referral response time  1 Day(s)  # of days IP prior to Palliative referral  17  Clinical Assessment  Palliative Performance Scale Score  20%  Psychosocial & Spiritual Assessment  Palliative Care Outcomes  Patient/Family meeting held?  Yes  Who was at the meeting?  mother and sister  Palliative Care Outcomes  Clarified goals of care       Patient Active Problem List   Diagnosis Date Noted  . Goals of care, counseling/discussion   . Tachycardia   . DNR (do not resuscitate) discussion 03/04/2017  . Palliative care by specialist 03/04/2017  . Protein calorie malnutrition ([place])   . Acute on chronic respiratory failure with hypoxemia ([place])   . Bacteremia   . Encounter for central line placement   . Elevated liver enzymes   . Jaundice   . Wound dehiscence   . Coma ([place])   . Osteomyelitis ([place]) 02/14/2017  . Pressure injury of skin 02/14/2017  . Cardiac arrest, cause unspecified ([place])   . Dehiscence of amputation stump ([place])   . Status post transmetatarsal amputation of left foot ([place]) 10/21/2016  . Status post transmetatarsal amputation of right foot ([place]) 10/08/2016  . 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 Diabetic osteomyelitis ([place])   . Type 1 diabetes mellitus with nephropathy ([place])   . Left foot infection 06/20/2016  . ESRD (end stage renal disease) ([place]) 06/20/2016  . Diabetic foot ulcer ([place]) 06/20/2016  . Chronic pain syndrome 02/24/2016  . Normal coronary arteries 10/12/2015  . NSTEMI- type 2, Troponin 11.2 05/11/2015  . Acute respiratory failure ([place]) 05/10/2015  . History of arthroscopy of right shoulder-05/10/15 05/10/2015  . CKD (chronic kidney disease) stage 5, GFR less than 15 ml/min ([facility]) 09/21/2014  . Unspecified asthma(493.90) 10/25/2013  . Acute combined systolic and diastolic heart failure ([place]) 09/21/2013  .  Non-ischemic cardiomyopathy - by echo 8/16- EF 35-40% 09/16/2013  . Morbid obesity-  09/16/2013  . Uncontrolled type 2 diabetes with renal manifestation ([place]) 09/15/2013  . Normocytic anemia 09/15/2013  . Lower extremity edema 09/15/2013  . Hyperlipidemia   . Hypertension     Palliative Care Plan    Recommendations/Plan:  Speech Therapy cognition eval appreciated.  Plan to move forward with passy muir valve testing   Family  would like to move forward with PEG placement.  Continue full scope treatment.  Goals of Care and Additional Recommendations:  Limitations on Scope of Treatment: Full Scope Treatment  Code Status:  Full code  Prognosis:   Unable to determine   Discharge Planning:  To Be Determined likely vent snf vs LTAC  Care plan was discussed with patient's mother and sister.  Thank you for allowing the Palliative Medicine Team to assist in the care of this patient.  Total time spent:  35 min.     Greater than 50%  of this time was spent counseling and coordinating care related to the above assessment and plan.  Imogene Burn, PA-C Palliative Medicine  Please contact Palliative MedicineTeam phone at [phone] for questions and concerns between 7 am - 7 pm.   Please see AMION for individual provider pager numbers.

## 2017-03-12 ENCOUNTER — Other Ambulatory Visit (INDEPENDENT_AMBULATORY_CARE_PROVIDER_SITE_OTHER): Payer: Self-pay | Admitting: Family

## 2017-03-12 DIAGNOSIS — Z930 Tracheostomy status: Secondary | ICD-10-CM

## 2017-03-12 DIAGNOSIS — R7881 Bacteremia: Secondary | ICD-10-CM

## 2017-03-12 DIAGNOSIS — G931 Anoxic brain damage, not elsewhere classified: Secondary | ICD-10-CM

## 2017-03-12 LAB — CBC WITH DIFFERENTIAL/PLATELET
BASOS PCT: 0 %
Basophils Absolute: 0.1 10*3/uL (ref 0.0–0.1)
EOS ABS: 0.6 10*3/uL (ref 0.0–0.7)
EOS PCT: 2 %
HCT: 29.5 % — ABNORMAL LOW (ref 36.0–46.0)
Hemoglobin: 8.9 g/dL — ABNORMAL LOW (ref 12.0–15.0)
Lymphocytes Relative: 6 %
Lymphs Abs: 1.5 10*3/uL (ref 0.7–4.0)
MCH: 29.2 pg (ref 26.0–34.0)
MCHC: 30.2 g/dL (ref 30.0–36.0)
MCV: 96.7 fL (ref 78.0–100.0)
MONO ABS: 1.8 10*3/uL — AB (ref 0.1–1.0)
MONOS PCT: 8 %
Neutro Abs: 20.2 10*3/uL — ABNORMAL HIGH (ref 1.7–7.7)
Neutrophils Relative %: 84 %
Platelets: 380 10*3/uL (ref 150–400)
RBC: 3.05 MIL/uL — ABNORMAL LOW (ref 3.87–5.11)
RDW: 18.1 % — AB (ref 11.5–15.5)
WBC: 24 10*3/uL — ABNORMAL HIGH (ref 4.0–10.5)

## 2017-03-12 LAB — GLUCOSE, CAPILLARY
GLUCOSE-CAPILLARY: 146 mg/dL — AB (ref 65–99)
GLUCOSE-CAPILLARY: 192 mg/dL — AB (ref 65–99)
Glucose-Capillary: 156 mg/dL — ABNORMAL HIGH (ref 65–99)
Glucose-Capillary: 162 mg/dL — ABNORMAL HIGH (ref 65–99)
Glucose-Capillary: 175 mg/dL — ABNORMAL HIGH (ref 65–99)
Glucose-Capillary: 181 mg/dL — ABNORMAL HIGH (ref 65–99)
Glucose-Capillary: 212 mg/dL — ABNORMAL HIGH (ref 65–99)

## 2017-03-12 MED ORDER — CARVEDILOL 12.5 MG PO TABS
12.5000 mg | ORAL_TABLET | Freq: Two times a day (BID) | ORAL | Status: DC
  Administered 2017-03-12 – 2017-03-26 (×22): 12.5 mg via ORAL
  Filled 2017-03-12 (×23): qty 1

## 2017-03-12 MED ORDER — HEPARIN SODIUM (PORCINE) 1000 UNIT/ML DIALYSIS
1000.0000 [IU] | INTRAMUSCULAR | Status: DC | PRN
  Filled 2017-03-12: qty 1

## 2017-03-12 MED ORDER — LIDOCAINE-PRILOCAINE 2.5-2.5 % EX CREA
1.0000 "application " | TOPICAL_CREAM | CUTANEOUS | Status: DC | PRN
  Filled 2017-03-12: qty 5

## 2017-03-12 MED ORDER — CALCIUM CARBONATE ANTACID 1250 MG/5ML PO SUSP
250.0000 mg | Freq: Three times a day (TID) | ORAL | Status: DC
  Administered 2017-03-12 – 2017-03-28 (×42): 250 mg via ORAL
  Filled 2017-03-12 (×43): qty 5

## 2017-03-12 MED ORDER — LIDOCAINE HCL (PF) 1 % IJ SOLN: 5.0000 mL | INTRAMUSCULAR | Status: DC | PRN

## 2017-03-12 MED ORDER — VANCOMYCIN HCL IN DEXTROSE 1-5 GM/200ML-% IV SOLN
1000.0000 mg | INTRAVENOUS | Status: DC
  Administered 2017-03-14: 1000 mg via INTRAVENOUS
  Filled 2017-03-12 (×2): qty 200

## 2017-03-12 MED ORDER — CHLORHEXIDINE GLUCONATE 4 % EX LIQD
60.0000 mL | Freq: Once | CUTANEOUS | Status: AC
  Administered 2017-03-12: 4 via TOPICAL
  Filled 2017-03-12: qty 60

## 2017-03-12 MED ORDER — VANCOMYCIN HCL 1000 MG IV SOLR
1500.0000 mg | INTRAVENOUS | Status: AC
  Administered 2017-03-12: 1500 mg via INTRAVENOUS
  Filled 2017-03-12: qty 1500

## 2017-03-12 MED ORDER — SODIUM CHLORIDE 0.9% FLUSH
10.0000 mL | Freq: Two times a day (BID) | INTRAVENOUS | Status: DC
  Administered 2017-03-12: 30 mL
  Administered 2017-03-13 – 2017-03-15 (×4): 10 mL
  Administered 2017-03-15: 30 mL
  Administered 2017-03-16: 20 mL
  Administered 2017-03-16 – 2017-03-24 (×14): 10 mL
  Administered 2017-03-24: 20 mL
  Administered 2017-03-25 – 2017-03-26 (×3): 10 mL

## 2017-03-12 MED ORDER — ALTEPLASE 2 MG IJ SOLR
2.0000 mg | Freq: Once | INTRAMUSCULAR | Status: DC | PRN
  Filled 2017-03-12: qty 2

## 2017-03-12 MED ORDER — PENTAFLUOROPROP-TETRAFLUOROETH EX AERO: 1.0000 "application " | INHALATION_SPRAY | CUTANEOUS | Status: DC | PRN

## 2017-03-12 MED ORDER — SODIUM CHLORIDE 0.9 % IV SOLN
100.0000 mL | INTRAVENOUS | Status: DC | PRN
  Administered 2017-03-13: 14:00:00 via INTRAVENOUS

## 2017-03-12 MED ORDER — SODIUM CHLORIDE 0.9 % IV SOLN: 100.0000 mL | INTRAVENOUS | Status: DC | PRN

## 2017-03-12 MED ORDER — SODIUM CHLORIDE 0.9% FLUSH: 10.0000 mL | INTRAVENOUS | Status: DC | PRN

## 2017-03-12 MED ORDER — CHLORHEXIDINE GLUCONATE CLOTH 2 % EX PADS: 6.0000 | MEDICATED_PAD | Freq: Every day | CUTANEOUS | Status: DC

## 2017-03-12 MED ORDER — CEFAZOLIN SODIUM-DEXTROSE 2-4 GM/100ML-% IV SOLN
2.0000 g | INTRAVENOUS | Status: DC
  Filled 2017-03-12: qty 100

## 2017-03-12 MED ORDER — DEXTROSE 5 % IV SOLN
2.0000 g | INTRAVENOUS | Status: DC
  Filled 2017-03-12: qty 2

## 2017-03-12 NOTE — Progress Notes (Signed)
New fever of 102 today with foot appearing worse than it has.  Possible plans for amputation per primary physician. Palliative care did meet with the family yesterday and they do want to proceed with g-tube placement.  We will hold on g-tube for now, given new fever and worsening appearance of her foot, as this is not an urgent procedure as she has feeding access.  We will plan for g-tube next week if patient tolerates amputation well.  Montserrath Madding E 8:24 AM 03/12/2017

## 2017-03-12 NOTE — Progress Notes (Addendum)
PROGRESS NOTE        PATIENT DETAILS Name: [name] Age: 36 y.o. Sex: female Date of Birth: [birth year] Admit Date: 02/14/2017 Admitting Physician Lady Deutscher, MD YHC:[id number], Helene Kelp, FNP  Note-care assumed by me on 6/13  Brief Narrative: Patient is a 36 y.o. female with history of ESRD on HD, poorly controlled diabetes, history of bilateral transmetatarsal foot amputation with subsequent left foot wound dehiscence (refused BKA as outpatient), chronic systolic heart failure felt to be secondary to nonischemic cardiomyopathy admitted on 02/14/17 with wound dehiscence and persistent drainage from the left trans-metatarsal amputation site, she was admitted to the [facility]. She was subsequently found to be unresponsive in her room on 5/19 after getting IV Dilaudid-she was  in asystole, CPR was started, with ROSC in around 9 minutes.She was then transferred to the intensive care unit, and subsequently underwent cooling and then rewarming. She was unable to be liberated off the ventilator, and as a result underwent tracheostomy on 5/31. Patient has been evaluated by neurology during this hospital stay, per neurology chances of meaningful neurological recovery is dismal. Hospital course has now been complicated by persistent leukocytosis, respiratory failure, ongoing wound dehiscence with dry gangrene. See below for further details   Subjective: Fever overnight-follows some commands (squeezes my hands), and tracks my movement.  Assessment/Plan: Acute hypoxemic respiratory failure in a setting of cardiac arrest: Continue ventilator support, tracheostomy in place-pulmonology following.  Cardiac arrest on 5/19 with anoxic brain injury: Essentially unchanged, has a tracheostomy in place and requires a PEG tube-IR planning PEG tube placement sometime next week. Has been evaluated by neurology on 5/29-felt to have overall poor prognosis for meaningful  neurological recovery. Able to follow simple commands, tracks my movement with her eyes.   Systemic inflammatory response syndrome: Febrile overnight-continues to have sinus tachycardia. Left foot wound reevaluated today-appears to have some purulent drainage from the lateral area. Suspect that given persistent leukocytosis and worsening fever-have discussed case with Dr. Pilar Jarvis will discuss with family later today for potential BKA. In the meantime, we will start empiric vancomycin and Fortaz. Await blood cultures done on 6/13.   Left trans-metatarsal stump wound dehiscence with gangrene: See above- Given persistent fever-and now purulent discharge from the lateral aspect of the left wound-have discussed case with Dr. Sharol Given for potential BKA, Dr. Sharol Given will discuss with family-tentatively scheduled for 6/15. Have spoken with the patient's mother at bedside-she is agreeable to proceed with an BKA.  ESRD: Nephrology following-HD on TTS.   Anemia: Likely secondary to chronic disease-probably worsened by acute illness. No signs of bleeding. Follow CBC.  DM-2: CBGs stable with SSI.  Chronic systolic heart failure/nonischemic cardiomyopathy (EF 30-35% by TEE on 6/4): Volume status stable-being managed with dialysis. Continue metoprolol.  Pericardial effusion: Seen on TEE-with no evidence of tamponade pathophysiology. Repeat echo at some point in the next few weeks.  Moderate protein calorie malnutrition: Continue NG tube feedings  Goals of care: Unfortunate 36 year old female with multiple medical comorbidities (ESRD, cardiomyopathy, bilateral transmetatarsal amputation)-now with anoxic brain injury due to cardiac arrest sustained on admission. She unfortunately continues to have persistent fever and leukocytosis-now appears to have some purulent discharge from her left transmetatarsal amputation site-with tentative plans to proceed with a left BKA on 6/15 per orthopedic. She still needs PEG tube  placement-that is tentatively scheduled for sometime next week by  interventional radiology. She is still ventilator dependent. Palliative care has engaged with family-she is a full code, plans are to continue with full aggressive care at this time. Overall prognosis for meaningful recovery leading to any significant quality of life is poor.  Telemetry (independently reviewed): Sinus tachycardia  Studies: Lower extremity ultrasound 5/18 >> no evidence of DVT CT head 5/19 >> normal exam Echo 5/20 >> EF 30-35%, increased LVF. Diffuse hypokinesis, akinesis of basilar mid-inferior myocardium, grade 2 diastolic dysfxn  EEG 9/24 >> finding c/w mod to severe global cerebral dysfxn. C/w anoxic injury given clinical course  LE [place] 5/21 >> negative MRI brain 5/24 >> ?thrombosed cortical vein, otherwise normal TEE 6/4 >> mild MR, normal AV, mild TR, mild PR, EF 30-35%, diffuse hypokinesis, no thrombus, no PFO, normal RV, moderate pericardial effusion with synechia suggesting some chronicity, no vegetations   Echo (reviewed):EF 30-35% on TEE done on 6/4  Morning labs/Imaging ordered: yes  DVT Prophylaxis: Prophylactic Heparin   Code Status: Full code   Family Communication: Mother at bedside  Disposition Plan: Remain inpatient in SDU  Antimicrobial agents: Anti-infectives    Start     Dose/Rate Route Frequency Ordered Stop   03/02/17 1200  cefTRIAXone (ROCEPHIN) 2 g in dextrose 5 % 50 mL IVPB     2 g 100 mL/hr over 30 Minutes Intravenous Every 24 hours 03/02/17 0810 03/06/17 1358   03/02/17 1200  vancomycin (VANCOCIN) IVPB 1000 mg/200 mL premix     1,000 mg 200 mL/hr over 60 Minutes Intravenous Every M-W-F (Hemodialysis) 03/02/17 0815 03/02/17 1457   03/02/17 1200  metroNIDAZOLE (FLAGYL) IVPB 500 mg     500 mg 100 mL/hr over 60 Minutes Intravenous Every 8 hours 03/02/17 0948 03/06/17 2230   02/26/17 1200  vancomycin (VANCOCIN) IVPB 1000 mg/200 mL premix     1,000 mg 200 mL/hr over 60  Minutes Intravenous Every T-Th-Sa (Hemodialysis) 02/25/17 1151 03/05/17 1325   02/25/17 1200  vancomycin (VANCOCIN) 2,000 mg in sodium chloride 0.9 % 500 mL IVPB     2,000 mg 250 mL/hr over 120 Minutes Intravenous  Once 02/25/17 1149 02/25/17 1449   02/25/17 0900  cefTRIAXone (ROCEPHIN) 2 g in dextrose 5 % 50 mL IVPB  Status:  Discontinued     2 g 100 mL/hr over 30 Minutes Intravenous Every 24 hours 02/25/17 0814 03/02/17 0810   02/25/17 0900  metroNIDAZOLE (FLAGYL) IVPB 500 mg  Status:  Discontinued     500 mg 100 mL/hr over 60 Minutes Intravenous Every 8 hours 02/25/17 0814 03/02/17 0948   02/20/17 1200  vancomycin (VANCOCIN) IVPB 1000 mg/200 mL premix  Status:  Discontinued     1,000 mg 200 mL/hr over 60 Minutes Intravenous Every M-W-F (Hemodialysis) 02/19/17 1506 02/20/17 1403   02/17/17 1800  meropenem (MERREM) 500 mg in sodium chloride 0.9 % 50 mL IVPB  Status:  Discontinued     500 mg 100 mL/hr over 30 Minutes Intravenous Daily-1800 02/17/17 1026 02/25/17 0814   02/17/17 1200  vancomycin (VANCOCIN) IVPB 1000 mg/200 mL premix     1,000 mg 200 mL/hr over 60 Minutes Intravenous Every T-Th-Sa (Hemodialysis) 02/14/17 1758 02/19/17 1612   02/14/17 2200  clindamycin (CLEOCIN) IVPB 900 mg  Status:  Discontinued     900 mg 100 mL/hr over 30 Minutes Intravenous Every 8 hours 02/14/17 1106 02/14/17 1928   02/14/17 2000  clindamycin (CLEOCIN) IVPB 900 mg  Status:  Discontinued     900 mg 100 mL/hr over 30 Minutes  Intravenous Every 8 hours 02/14/17 1928 02/16/17 1047   02/14/17 1915  piperacillin-tazobactam (ZOSYN) IVPB 3.375 g  Status:  Discontinued     3.375 g 100 mL/hr over 30 Minutes Intravenous Every 12 hours 02/14/17 1801 02/17/17 1026   02/14/17 1845  vancomycin (VANCOCIN) 2,000 mg in sodium chloride 0.9 % 500 mL IVPB     2,000 mg 250 mL/hr over 120 Minutes Intravenous  Once 02/14/17 1758 02/14/17 2107   02/14/17 1115  clindamycin (CLEOCIN) IVPB 900 mg  Status:  Discontinued     900  mg 100 mL/hr over 30 Minutes Intravenous  Once 02/14/17 1106 02/14/17 2236   02/14/17 0945  clindamycin (CLEOCIN) IVPB 600 mg     600 mg 100 mL/hr over 30 Minutes Intravenous  Once 02/14/17 0931 02/14/17 1013      Procedures: ETT 5/19 >> 5/31 Lt IJ CVL 5/19 >> out Trach 5/31 >>  TEE 6/4 >> mild MR, normal AV, mild TR, mild PR, EF 30-35%, diffuse hypokinesis, no thrombus, no PFO, normal RV, moderate pericardial effusion with synechia suggesting some chronicity, no vegetations   CONSULTS:  Infectious disease:  Neurology  Gastroenterology  Wound care  PCCM  Nephrology  Orthopedics  Time spent: 30 minutes-Greater than 50% of this time was spent in counseling, explanation of diagnosis, planning of further management, and coordination of care.  MEDICATIONS: Scheduled Meds: . calcium carbonate (dosed in mg elemental calcium)  2,000 mg of elemental calcium Oral TID  . chlorhexidine gluconate (MEDLINE KIT)  15 mL Mouth Rinse BID  . darbepoetin (ARANESP) injection - DIALYSIS  200 mcg Intravenous Q Tue-HD  . feeding supplement (NEPRO CARB STEADY)  1,000 mL Per Tube Q24H  . feeding supplement (PRO-STAT SUGAR FREE 64)  60 mL Per Tube QID  . [name]'s butt cream   Topical QID  . heparin  5,000 Units Subcutaneous Q8H  . insulin aspart  0-15 Units Subcutaneous Q4H  . mouth rinse  15 mL Mouth Rinse QID  . metoprolol succinate  25 mg Oral QHS  . multivitamin  1 tablet Oral QHS  . pantoprazole sodium  40 mg Per Tube Q24H   Continuous Infusions: . sodium chloride 10 mL/hr at 03/03/17 1700  . sodium chloride 20 mL/hr at 03/07/17 0413  . sodium chloride    . sodium chloride    . sodium chloride     PRN Meds:.sodium chloride, sodium chloride, sodium chloride, acetaminophen (TYLENOL) oral liquid 160 mg/5 mL, albuterol, alteplase, fentaNYL (SUBLIMAZE) injection, heparin, lidocaine (PF), lidocaine, lidocaine-prilocaine, metoprolol tartrate, pentafluoroprop-tetrafluoroeth   PHYSICAL  EXAM: Vital signs: Vitals:   03/12/17 0350 03/12/17 0418 03/12/17 0500 03/12/17 0823  BP:  111/83  118/83  Pulse:    (!) 117  Resp:  17  20  Temp: 100.1 F (37.8 C)   98.9 F (37.2 C)  TempSrc: Axillary   Oral  SpO2:  100%  92%  Weight:   89.4 kg (197 lb)   Height:       Filed Weights   03/11/17 0530 03/11/17 0822 03/12/17 0500  Weight: 91.2 kg (201 lb) 93 kg (205 lb) 89.4 kg (197 lb)   Body mass index is 29.52 kg/m.   General appearance:Awake-track severe movement. Appears chronically ill Eyes: No scleral icterus HEENT: Atraumatic and normocephalic Neck: Tracheostomy in place Resp: VA bilaterally-transmitted upper airway sounds CVS: S1-S2 regular, tachycardic GI: Soft, nontender and nondistended Extremities: Left transmetatarsal stump-moderate amount of purulent discharge from the lateral area. Right transmetatarsal stump appears intact.  Neurology:  Appears to track my movement-squeezes my hands-unable to evaluate any further Musculoskeletal: No digital cyanosis. Skin: No rash Wounds: See above  I have personally reviewed following labs and imaging studies  LABORATORY DATA: CBC:  Recent Labs Lab 03/07/17 0725 03/10/17 0721 03/11/17 0842  WBC 24.4* 33.8* 28.5*  NEUTROABS  --   --  23.0*  HGB 8.4* 8.9* 9.1*  HCT 28.5* 29.9* 30.5*  MCV 97.3 96.8 97.4  PLT 559* 460* 741    Basic Metabolic Panel:  Recent Labs Lab 03/07/17 0726 03/10/17 0722 03/11/17 0842  NA 140 142 140  K 3.1* 3.2* 4.0  CL 98* 101 98*  CO2 _0 GLUCOSE 144* 173* 175*  BUN 82* 131* 74*  CREATININE 5.14* 6.92* 4.65*  CALCIUM 9.2 9.6 9.3  PHOS 3.9 4.4 3.4    GFR: Estimated Creatinine Clearance: 19.6 mL/min (A) (by C-G formula based on SCr of 4.65 mg/dL (H)).  Liver Function Tests:  Recent Labs Lab 03/07/17 0726 03/10/17 0722 03/11/17 0842  ALBUMIN 2.2* 2.2* 2.5*   No results for input(s): LIPASE, AMYLASE in the last 168 hours. No results for input(s): AMMONIA in the  last 168 hours.  Coagulation Profile: No results for input(s): INR, PROTIME in the last 168 hours.  Cardiac Enzymes: No results for input(s): CKTOTAL, CKMB, CKMBINDEX, TROPONINI in the last 168 hours.  BNP (last 3 results) No results for input(s): PROBNP in the last 8760 hours.  HbA1C: No results for input(s): HGBA1C in the last 72 hours.  CBG:  Recent Labs Lab 03/11/17 1302 03/11/17 1629 03/11/17 1958 03/12/17 0017 03/12/17 0348  GLUCAP 142* 131* 164* 192* 181*    Lipid Profile: No results for input(s): CHOL, HDL, LDLCALC, TRIG, CHOLHDL, LDLDIRECT in the last 72 hours.  Thyroid Function Tests: No results for input(s): TSH, T4TOTAL, FREET4, T3FREE, THYROIDAB in the last 72 hours.  Anemia Panel: No results for input(s): VITAMINB12, FOLATE, FERRITIN, TIBC, IRON, RETICCTPCT in the last 72 hours.  Urine analysis:    Component Value Date/Time   COLORURINE AMBER (A) 02/14/2017 2111   APPEARANCEUR HAZY (A) 02/14/2017 2111   LABSPEC 1.015 02/14/2017 2111   PHURINE 5.0 02/14/2017 2111   GLUCOSEU NEGATIVE 02/14/2017 2111   HGBUR SMALL (A) 02/14/2017 2111   BILIRUBINUR NEGATIVE 02/14/2017 2111   KETONESUR NEGATIVE 02/14/2017 2111   PROTEINUR 30 (A) 02/14/2017 2111   UROBILINOGEN 0.2 09/21/2014 1908   NITRITE NEGATIVE 02/14/2017 2111   LEUKOCYTESUR TRACE (A) 02/14/2017 2111    Sepsis Labs: Lactic Acid, Venous    Component Value Date/Time   LATICACIDVEN 4.6 ([place]) 02/14/2017 1807    MICROBIOLOGY: Recent Results (from the past 240 hour(s))  C difficile quick scan w PCR reflex     Status: None   Collection Time: 03/04/17  2:17 PM  Result Value Ref Range Status   C Diff antigen NEGATIVE NEGATIVE Final   C Diff toxin NEGATIVE NEGATIVE Final   C Diff interpretation No C. difficile detected.  Final    RADIOLOGY STUDIES/RESULTS: Ct Abdomen Wo Contrast  Result Date: 03/02/2017 CLINICAL DATA:  Preop for gastrostomy to EXAM: CT ABDOMEN WITHOUT CONTRAST TECHNIQUE:  Multidetector CT imaging of the abdomen was performed following the standard protocol without IV contrast. COMPARISON:  None. FINDINGS: Lower chest: Bibasilar dependent and medial consolidation. Large pericardial effusion. Hepatobiliary: The left lobe of the liver is prominent and anterior to the stoma. The gallbladder is not clearly visualized. It may either be decompressed or absent due to cholecystectomy. Pancreas: Unremarkable  Spleen: Unremarkable Adrenals/Urinary Tract: Kidneys and adrenal glands are within normal limits. Stomach/Bowel: The stomach is positioned deep to the liver and colon. Gastrostomy tube placement may be problematic. No evidence of small-bowel obstruction. Feeding tube tip is in the proximal duodenum. Vascular/Lymphatic: Small para-aortic lymph nodes. Atherosclerotic vascular calcifications are noted. No evidence of aortic aneurysm. Other: No free-fluid. Musculoskeletal: No vertebral compression deformity. IMPRESSION: The stomach is positioned deep to a prominent left lobe of the liver as well as the transverse colon. Gas is distension of the stomach and barium opacification of the transverse colon will be essential during the procedure. Bibasilar pulmonary consolidation. Large pericardial effusion. Electronically Signed   By: Marybelle Killings M.D.   On: 03/02/2017 07:18   Dg Chest 2 View  Result Date: 02/14/2017 CLINICAL DATA:  Hypoxia. Pt came to ED with left foot pain post amputation of toes. Hx diabetes, PNA, COPD, HTN, MI, CAD, CKD, CHF. EXAM: CHEST  2 VIEW COMPARISON:  04/22/2016 FINDINGS: Moderate enlargement of the cardiopericardial silhouette, stable. No mediastinal or hilar masses. No evidence of adenopathy. Clear lungs.  No pleural effusion.  No pneumothorax. Skeletal structures are unremarkable. IMPRESSION: 1. No acute cardiopulmonary disease. 2. Stable moderate cardiomegaly. Electronically Signed   By: Lajean Manes M.D.   On: 02/14/2017 09:26   Ct Head Wo Contrast  Result  Date: 02/14/2017 CLINICAL DATA:  Cardiac arrest and, EXAM: CT HEAD WITHOUT CONTRAST TECHNIQUE: Contiguous axial images were obtained from the base of the skull through the vertex without intravenous contrast. COMPARISON:  None. FINDINGS: Brain: No mass lesion, intraparenchymal hemorrhage or extra-axial collection. No evidence of acute cortical infarct. Brain parenchyma and CSF-containing spaces are normal for age. Mildly asymmetric hypoattenuation of the right temporal lobe relative to the left is likely artifactual Vascular: No hyperdense vessel or unexpected calcification. Skull: Normal visualized skull base, calvarium and extracranial soft tissues. Sinuses/Orbits: No sinus fluid levels or advanced mucosal thickening. No mastoid effusion. Normal orbits. IMPRESSION: Normal head CT. Electronically Signed   By: Ulyses Jarred M.D.   On: 02/14/2017 22:23   Mr Brain Wo Contrast  Result Date: 02/18/2017 CLINICAL DATA:  Anoxic brain injury status post cardiac arrest EXAM: MRI HEAD WITHOUT CONTRAST TECHNIQUE: Multiplanar, multiecho pulse sequences of the brain and surrounding structures were obtained without intravenous contrast. IV contrast could not be administered due to the patient's poor renal function. COMPARISON:  Head CT 02/14/2017 FINDINGS: Brain: The midline structures are normal. There is no focal diffusion restriction to indicate acute infarct. The brain parenchymal signal is normal and there is no mass lesion. There is a focal susceptibility abnormality at the left insula (series 12, image 55). Brain volume is normal for age without age-advanced or lobar predominant atrophy. The dura is normal and there is no extra-axial collection. Vascular: Major intracranial arterial and venous sinus flow voids are preserved. Skull and upper cervical spine: The visualized skull base, calvarium, upper cervical spine and extracranial soft tissues are normal. Sinuses/Orbits: No fluid levels or advanced mucosal thickening.  No mastoid effusion. Normal orbits. IMPRESSION: 1. Focus of susceptibility in the left insula may indicate a thrombosed cortical vein. The location does not correspond to a large artery. 2. No associated ischemia or hemorrhage.  Otherwise normal brain. Electronically Signed   By: Ulyses Jarred M.D.   On: 02/18/2017 14:55   Ir Fluoro Guide Cv Line Right  Result Date: 03/11/2017 INDICATION: Osteomyelitis EXAM: TUNNELED RIGHT JUGULAR PICC LINE PLACEMENT WITH ULTRASOUND AND FLUOROSCOPIC GUIDANCE MEDICATIONS: None. ANESTHESIA/SEDATION: None FLUOROSCOPY  TIME:  Fluoroscopy Time:  minutes 30 seconds (1 mGy). COMPLICATIONS: None immediate. PROCEDURE: The patient was advised of the possible risks and complications and agreed to undergo the procedure. The patient was then brought to the angiographic suite for the procedure. The right neck was prepped with chlorhexidine, draped in the usual sterile fashion using maximum barrier technique (cap and mask, sterile gown, sterile gloves, large sterile sheet, hand hygiene and cutaneous antiseptic). Local anesthesia was attained by infiltration with 1% lidocaine. Ultrasound demonstrated patency of the right jugular vein, and this was documented with an image. Under real-time ultrasound guidance, this vein was accessed with a 21 gauge micropuncture needle and image documentation was performed. A long subcutaneous tract was employed. The needle was exchanged over a guidewire for a peel-away sheath through which a 26 cm 5 [place] double lumen power injectable PICC was advanced, and positioned with its tip at the lower SVC/right atrial junction. The cuff was positioned in the subcutaneous tract. Fluoroscopy during the procedure and fluoro spot radiograph confirms appropriate catheter position. The catheter was flushed, secured to the skin with Prolene sutures, and covered with a sterile dressing. IMPRESSION: Successful placement of a tunneled right jugular PICC with sonographic and  fluoroscopic guidance. The catheter is ready for use. Electronically Signed   By: Marybelle Killings M.D.   On: 03/11/2017 15:31   Ir US Guide Vasc Access Right  Result Date: 03/11/2017 INDICATION: Osteomyelitis EXAM: TUNNELED RIGHT JUGULAR PICC LINE PLACEMENT WITH ULTRASOUND AND FLUOROSCOPIC GUIDANCE MEDICATIONS: None. ANESTHESIA/SEDATION: None FLUOROSCOPY TIME:  Fluoroscopy Time:  minutes 30 seconds (1 mGy). COMPLICATIONS: None immediate. PROCEDURE: The patient was advised of the possible risks and complications and agreed to undergo the procedure. The patient was then brought to the angiographic suite for the procedure. The right neck was prepped with chlorhexidine, draped in the usual sterile fashion using maximum barrier technique (cap and mask, sterile gown, sterile gloves, large sterile sheet, hand hygiene and cutaneous antiseptic). Local anesthesia was attained by infiltration with 1% lidocaine. Ultrasound demonstrated patency of the right jugular vein, and this was documented with an image. Under real-time ultrasound guidance, this vein was accessed with a 21 gauge micropuncture needle and image documentation was performed. A long subcutaneous tract was employed. The needle was exchanged over a guidewire for a peel-away sheath through which a 26 cm 5 [place] double lumen power injectable PICC was advanced, and positioned with its tip at the lower SVC/right atrial junction. The cuff was positioned in the subcutaneous tract. Fluoroscopy during the procedure and fluoro spot radiograph confirms appropriate catheter position. The catheter was flushed, secured to the skin with Prolene sutures, and covered with a sterile dressing. IMPRESSION: Successful placement of a tunneled right jugular PICC with sonographic and fluoroscopic guidance. The catheter is ready for use. Electronically Signed   By: Marybelle Killings M.D.   On: 03/11/2017 15:31   Dg Chest Port 1 View  Result Date: 03/11/2017 CLINICAL DATA:  Leukocytosis.  EXAM: PORTABLE CHEST 1 VIEW COMPARISON:  03/03/2017 FINDINGS: Patient is rotated to the left. The cardio pericardial silhouette is enlarged. Slight improvement in left base aeration. Tracheostomy tube remains in place. A feeding tube passes into the stomach although the distal tip position is not included on the film. The visualized bony structures of the thorax are intact. Telemetry leads overlie the chest. IMPRESSION: Persistent enlargement of the cardiopericardial silhouette without evidence for overt pulmonary edema or substantial pleural effusion. Electronically Signed   By: Verda Cumins.D.  On: 03/11/2017 08:37   Dg Chest Port 1 View  Result Date: 03/03/2017 CLINICAL DATA:  Respiratory failure. EXAM: PORTABLE CHEST 1 VIEW COMPARISON:  03/01/2017 . FINDINGS: Tracheostomy tube and feeding tube in stable position. Cardiomegaly with mild diffuse interstitial prominence suggesting mild CHF. Persistent atelectasis and consolidation left lower lobe. Small left pleural effusion cannot be excluded. No pneumothorax. IMPRESSION: 1. Tracheostomy tube and feeding tube in stable position. 2. Cardiomegaly with diffuse mild interstitial prominence suggesting mild CHF. 3. Persistent left lower lobe atelectasis and consolidation. Small left pleural effusion cannot be excluded . Electronically Signed   By: Marcello Moores  Register   On: 03/03/2017 07:09   Dg Chest Port 1 View  Result Date: 03/01/2017 CLINICAL DATA:  Respiratory failure EXAM: PORTABLE CHEST 1 VIEW COMPARISON:  02/27/2017 FINDINGS: 0546 hours. Patient rotated to the left. Tracheostomy tube remains in place. A feeding tube passes into the stomach although the distal tip position is not included on the film. The cardio pericardial silhouette is enlarged. Left base collapse/consolidation again noted. There is pulmonary vascular congestion without overt pulmonary edema. IMPRESSION: Rotated film with cardiomegaly and vascular congestion. Persistent left base  collapse/ consolidation. Electronically Signed   By: Misty Stanley M.D.   On: 03/01/2017 07:43   Dg Chest Port 1 View  Result Date: 02/27/2017 CLINICAL DATA:  Hypoxia. EXAM: PORTABLE CHEST 1 VIEW COMPARISON:  02/26/2017. FINDINGS: Tracheostomy to in stable position. Stable cardiomegaly. Low lung volumes. Progressive left lower lobe atelectasis and infiltrate. Small left pleural effusion cannot be excluded . IMPRESSION: 1. Tracheostomy tube in stable position. 2. Progressive left lower lobe atelectasis and infiltrate. Small left pleural effusion cannot be excluded . Electronically Signed   By: Marcello Moores  Register   On: 02/27/2017 07:01   Dg Chest Port 1 View  Result Date: 02/26/2017 CLINICAL DATA:  Status post tracheostomy placement. EXAM: PORTABLE CHEST 1 VIEW COMPARISON:  Earlier today. FINDINGS: The endotracheal tube has been removed and replaced with a tracheostomy tube in satisfactory position. The nasogastric tube and esophageal probe have been removed. The left jugular catheter has been removed. No pneumothorax. Grossly stable enlarged cardiac silhouette. Left lower lobe opacity with improvement laterally since 02/25/2017. Clear right lung. Unremarkable bones. IMPRESSION: 1. Tracheostomy tube in satisfactory position. 2. Left lower lobe atelectasis or pneumonia with mild improvement since 2 days ago. 3. Stable cardiomegaly. Electronically Signed   By: Claudie Revering M.D.   On: 02/26/2017 16:02   Dg Chest Port 1 View  Result Date: 02/26/2017 CLINICAL DATA:  Hypoxia.  Shortness of breath. EXAM: PORTABLE CHEST 1 VIEW COMPARISON:  02/25/2017. FINDINGS: Endotracheal tube, NG tube, left IJ line esophageal probe in stable position. Cardiomegaly with bilateral pulmonary interstitial infiltrates consistent with CHF. Left base atelectasis . Small left pleural effusion. Similar findings noted on prior exam. No pneumothorax. IMPRESSION: 1. Lines and tubes in stable position. 2. Cardiomegaly with bilateral pulmonary  interstitial prominence and small left pleural effusion noted consistent CHF. Left base atelectasis. Similar findings noted on prior exam. Electronically Signed   By: Glen Flora   On: 02/26/2017 07:18   Dg Chest Port 1 View  Result Date: 02/25/2017 CLINICAL DATA:  Respiratory failure, intubated patient. Diabetes, cardiomyopathy, morbid obesity, end-stage renal disease. EXAM: PORTABLE CHEST 1 VIEW COMPARISON:  Portable chest x-ray of Feb 24, 2017 FINDINGS: The lungs are adequately inflated. The pulmonary interstitial markings are slightly less prominent today. The pulmonary vascularity remains engorged. The cardiac silhouette remains enlarged. Retrocardiac region on the left is slightly less  dense. The endotracheal tube tip lies 5.7 cm above the carina. The esophagogastric tube tip projects below the inferior margin of the image. The left internal jugular venous catheter tip projects over the proximal SVC. IMPRESSION: Slight interval improvement in pulmonary interstitial edema. Stable cardiomegaly. Persistent left lower lobe atelectasis or pneumonia. The support tubes are in reasonable position. Electronically Signed   By: David  Martinique M.D.   On: 02/25/2017 07:57   Dg Chest Port 1 View  Result Date: 02/24/2017 CLINICAL DATA:  Respiratory failure EXAM: PORTABLE CHEST 1 VIEW COMPARISON:  Two days ago FINDINGS: Endotracheal tube tip at the clavicular heads. An orogastric tube reaches the stomach. Esophageal thermistor. Left IJ central line with tip at the SVC level. Cardiomegaly and diffuse hazy opacity with cephalized blood flow. No definitive effusion. No pneumothorax. IMPRESSION: 1. Stable positioning of tubes and central line. 2. CHF pattern. Electronically Signed   By: Monte Fantasia M.D.   On: 02/24/2017 07:31   Dg Chest Port 1 View  Result Date: 02/22/2017 CLINICAL DATA:  Respiratory failure EXAM: PORTABLE CHEST 1 VIEW COMPARISON:  02/21/2017 FINDINGS: Endotracheal tube terminates 5 cm above  the carina. Cardiomegaly with mild interstitial edema. Retrocardiac opacity, possibly reflecting a combination of atelectasis and pleural effusion, pneumonia not excluded. No pneumothorax. Left IJ venous catheter terminates at the cavoatrial junction. Enteric tube courses into the stomach. IMPRESSION: Endotracheal tube terminates 5 cm above the carina. Cardiomegaly with mild interstitial edema. Retrocardiac opacity, possibly atelectasis and pleural effusion, pneumonia not excluded. Electronically Signed   By: Julian Hy M.D.   On: 02/22/2017 07:30   Dg Chest Port 1 View  Result Date: 02/21/2017 CLINICAL DATA:  Endotracheal tube. EXAM: PORTABLE CHEST 1 VIEW COMPARISON:  02/19/2017 FINDINGS: Endotracheal tube terminates approximately 5 cm above the carina. Enteric tube courses into the left upper abdomen with tip not imaged. Left jugular catheter terminates over the mid SVC. The cardiac silhouette remains enlarged. Pulmonary vascular congestion and bilateral parenchymal lung opacities have not significantly changed. No large pleural effusion or pneumothorax is identified. IMPRESSION: Unchanged pulmonary edema. Electronically Signed   By: Logan Bores M.D.   On: 02/21/2017 07:42   Dg Chest Port 1 View  Result Date: 02/19/2017 CLINICAL DATA:  36 year old female with respiratory failure. EXAM: PORTABLE CHEST 1 VIEW COMPARISON:  02/18/2017 and prior radiograph FINDINGS: An endotracheal tube with tip 3.5 cm above the carina, left IJ central venous catheter with tip overlying the lower SVC, and NG tube entering the stomach with tip off the field of view again noted. Pulmonary edema has decreased from the prior study. Continued left lower lung atelectasis/ consolidation again noted. There is no evidence of pneumothorax. No other changes are noted. IMPRESSION: Decreased pulmonary edema, otherwise unchanged appearance of the chest. Electronically Signed   By: Margarette Canada M.D.   On: 02/19/2017 08:03   Dg Chest  Port 1 View  Result Date: 02/18/2017 CLINICAL DATA:  Respiratory failure EXAM: PORTABLE CHEST 1 VIEW COMPARISON:  02/17/2017 FINDINGS: Cardiac shadow remains enlarged. Endotracheal tube, nasogastric catheter and esophageal probe are again seen and stable. Left jugular central line is again noted and stable. No pneumothorax is seen. Diffuse vascular congestion and increased parenchymal opacity is noted consistent with progressive CHF. No focal confluent infiltrate is noted. IMPRESSION: Progressive changes of CHF. Electronically Signed   By: Inez Catalina M.D.   On: 02/18/2017 08:15   Dg Chest Port 1 View  Result Date: 02/17/2017 CLINICAL DATA:  Pulmonary edema EXAM: PORTABLE CHEST  1 VIEW COMPARISON:  02/16/2017 FINDINGS: Endotracheal tube, NG tube, left jugular central venous catheter are stable. Severe cardiomegaly is stable. Mild vascular congestion is stable. No consolidation. No interstitial edema. IMPRESSION: Stable cardiomegaly and mild vascular congestion. Electronically Signed   By: Marybelle Killings M.D.   On: 02/17/2017 07:35   Dg Chest Port 1 View  Result Date: 02/16/2017 CLINICAL DATA:  Acute respiratory failure. EXAM: PORTABLE CHEST 1 VIEW COMPARISON:  Radiograph of Feb 14, 2017. FINDINGS: Stable cardiomegaly. Endotracheal and nasogastric tubes are unchanged in position. Left internal jugular catheter is unchanged with distal tip in expected position of SVC. No pneumothorax is noted. Lungs are clear. Bony thorax is unremarkable. IMPRESSION: Stable support apparatus.  Stable cardiomegaly. Electronically Signed   By: Marijo Conception, M.D.   On: 02/16/2017 07:30   Dg Chest Port 1 View  Result Date: 02/14/2017 CLINICAL DATA:  Central line placement EXAM: PORTABLE CHEST 1 VIEW COMPARISON:  02/14/2017 at 0913 hours FINDINGS: Endotracheal tube terminates 4.5 cm above the carina. Lungs are clear.  No pleural effusion or pneumothorax. Cardiomegaly. Left IJ venous catheter terminates at cavoatrial  junction. Enteric tube courses into the stomach. Defibrillator pads overlying the left hemithorax. IMPRESSION: Endotracheal tube terminates 4.5 cm above the carina. Left IJ venous catheter terminates at the cavoatrial junction. Electronically Signed   By: Julian Hy M.D.   On: 02/14/2017 18:31   Dg Abd Portable 1v  Result Date: 03/02/2017 CLINICAL DATA:  Encounter for feeding tube placement EXAM: PORTABLE ABDOMEN - 1 VIEW COMPARISON:  Portable exam 1712 hours compared to CT abdomen and pelvis of 03/01/2017 FINDINGS: Feeding tube traverses abdomen with tip projecting over distal antrum near pylorus. Cardiac silhouette appears enlarged. Visualized bowel gas pattern normal. Osseous structures unremarkable. IMPRESSION: Tip of feeding tube projects over distal gastric antrum near pylorus. Electronically Signed   By: Lavonia Dana M.D.   On: 03/02/2017 17:21   Dg Abd Portable 1v  Result Date: 02/27/2017 CLINICAL DATA:  Feeding tube placement EXAM: PORTABLE ABDOMEN - 1 VIEW COMPARISON:  None. FINDINGS: Feeding tube with the tip projecting over the antrum of the stomach. There is no bowel dilatation to suggest obstruction. There is no evidence of pneumoperitoneum, portal venous gas or pneumatosis. There are no pathologic calcifications along the expected course of the ureters. The osseous structures are unremarkable. IMPRESSION: Feeding tube with the tip projecting over the antrum of the stomach. Electronically Signed   By: Kathreen Devoid   On: 02/27/2017 11:05   Dg Foot Complete Left  Result Date: 02/14/2017 CLINICAL DATA:  Pt c/o severe pain and inflammation in left foot. 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 Weeping evident at site of amputation. Pt reports nausea today. Hx diabetes, HTN, CKD. EXAM: LEFT FOOT - COMPLETE 3+ VIEW COMPARISON:  12/29/2016 FINDINGS: Postop changes including resection of the proximal first and fourth metatarsals since prior exam. Small proximal fragments of the  second third and fifth metatarsals are identified. There is some indistinctness of the resection margin of the third and fifth metatarsal fragments suggesting osteomyelitis. There is a soft tissue defect distal to the resection margin seen best on the lateral projection. IMPRESSION: 1. Interval postop changes. Indistinctness of residual third and fifth metatarsal fragments, suggesting osteomyelitis. Electronically Signed   By: Lucrezia Europe M.D.   On: 02/14/2017 08:15   US Abdomen Limited Ruq  Result Date: 02/18/2017 CLINICAL DATA:  Elevated liver function studies. EXAM: US ABDOMEN LIMITED - RIGHT UPPER QUADRANT COMPARISON:  None.  FINDINGS: Gallbladder: No gallstones or wall thickening visualized. No sonographic Murphy sign noted by sonographer. Common bile duct: Diameter: 7 mm, normal Liver: Circumscribed hyperechoic lesion with central hypoechoic appearance demonstrated along the dome of the liver measuring 2.8 cm maximal diameter. This is likely to represent a cavernous hemangioma, but the appearance on ultrasound is somewhat atypical. Consider follow-up with elective MRI of the liver for better characterization. IMPRESSION: No evidence of cholelithiasis or cholecystitis. Circumscribed hyperechoic lesion in the dome of the liver measuring 2.8 cm maximal diameter. This is probably a cavernous hemangioma but appearance is somewhat atypical. Suggest elective MRI liver in follow-up for better characterization. Electronically Signed   By: Lucienne Capers M.D.   On: 02/18/2017 02:10     LOS: 26 days   Oren Binet, MD  [facility] Pager:[phone]  If 7PM-7AM, please contact night-coverage [email or web address] Password [email or web address] 03/12/2017, 8:43 AM

## 2017-03-12 NOTE — Progress Notes (Signed)
PULMONARY / CRITICAL CARE MEDICINE   Name: [name] MRN: [id number] DOB: 08/02/1981    ADMISSION DATE:  02/14/2017 CONSULTATION DATE:  02/14/2017  REFERRING MD:  Feliz Beam MD  CHIEF COMPLAINT:  Sepsis, cardiac arrest  BRIEF SUMMARY:   36 year old female w/ ESRD d/t poorly controlled DM, bilateral transmetatarsal foot amputations, obesity, HTN, medical non-compliance as well as NICM. Admitted 5/19 with complaints of left leg pain, found to have radiological evidence of osteomyelitis with wound nonhealing, dehiscence, purulent drainage from the wound. Admitted for IV antibiotics, hemodialysis and possible surgery.  Found unresponsive in the room on 5/19. She had chest compressions, epi bicarb given. She was intubated by anesthesia and had ROSC in approximately 9 minutes. Downtime prior to code is unknown. She got dilaudid in the afternoon for leg pain.  SUBJECTIVE:   Failed SBT 6/13 Weaned today on 18/5 for almost 4 hours on HD- aborted due to tachypnea, tachycardia and sats dropping per RN. No change in secretions Dr. [name] to see for potential left BKA ? PEG for next week  VITAL SIGNS: BP 110/87   Pulse (!) 114   Temp 98 F (36.7 C) (Oral)   Resp 20   Ht 5' 8.5" (1.74 m)   Wt 196 lb 13.9 oz (89.3 kg)   LMP  (LMP Unknown) Comment: Patient trached  SpO2 100%   BMI 29.50 kg/m   VENTILATOR SETTINGS: Vent Mode: PRVC FiO2 (%):  [30 %-40 %] 30 % Set Rate:  [16 bmp] 16 bmp Vt Set:  [520 mL-530 mL] 530 mL PEEP:  [5 cmH20] 5 cmH20 Pressure Support:  [18 cmH20] 18 cmH20 Plateau Pressure:  [16 cmH20-25 cmH20] 25 cmH20  INTAKE / OUTPUT: I/O last 3 completed shifts: In: 2556 [I.V.:2041; Other:200; NG/GT:315] Out: -   General:  Chronically ill, frail adult lying in bed on MV in NAD HEENT: MM pink/moist, ETT, cortrak Neuro: Awake, follows simple commands, MAE CV: ST 120, s1s2 rrr, no m/r/g PULM: even/non-labored on full MV support, lungs bilaterally rhonchi- cleared with  suctioning- thick white, slightly residual diminished in LLL GI: soft, non-tender, bsx4 active  Extremities: warm/dry, no edema, bilateral heel protectors, dsg CDI  to LUA AVF Skin: no rashes or lesions   LABS:  BMET  Recent Labs Lab 03/07/17 0726 03/10/17 0722 03/11/17 0842  NA 140 142 140  K 3.1* 3.2* 4.0  CL 98* 101 98*  CO2 26 23 27   BUN 82* 131* 74*  CREATININE 5.14* 6.92* 4.65*  GLUCOSE 144* 173* 175*    Electrolytes  Recent Labs Lab 03/07/17 0726 03/10/17 0722 03/11/17 0842  CALCIUM 9.2 9.6 9.3  PHOS 3.9 4.4 3.4    CBC  Recent Labs Lab 03/10/17 0721 03/11/17 0842 03/12/17 0702  WBC 33.8* 28.5* 24.0*  HGB 8.9* 9.1* 8.9*  HCT 29.9* 30.5* 29.5*  PLT 460* 377 380    No results for input(s): PHART, PCO2ART, PO2ART in the last 168 hours.  Liver Enzymes  Recent Labs Lab 03/07/17 0726 03/10/17 0722 03/11/17 0842  ALBUMIN 2.2* 2.2* 2.5*   Glucose  Recent Labs Lab 03/11/17 1302 03/11/17 1629 03/11/17 1958 03/12/17 0017 03/12/17 0348 03/12/17 0823  GLUCAP 142* 131* 164* 192* 181* 212*   STUDIES:  Lower extremity ultrasound 5/18 >> no evidence of DVT CT head 5/19 >> normal exam Echo 5/20 >> EF 30-35%, increased LVF. Diffuse hypokinesis, akinesis of basilar mid-inferior myocardium, grade 2 diastolic dysfxn  EEG 5/20 >> finding c/w mod to severe global cerebral dysfxn.  C/w anoxic injury given clinical course  LE [place] 5/21 >> negative MRI brain 5/24 >> ?thrombosed cortical vein, otherwise normal TEE 6/4 >> mild MR, normal AV, mild TR, mild PR, EF 30-35%, diffuse hypokinesis, no thrombus, no PFO, normal RV, moderate pericardial effusion with synechia suggesting some chronicity, no vegetations   CULTURES: Bcx 5/19 X 2 >> negative Blood cultures 6/1 >> negative c diff 6/1 >> negative BC 6/13 >>  ANTIBIOTICS: Clinidamycin 5/19 >> 5/21 Vancomycin 5/19 >> 5/25;  5/30 >>6/7; restart 6/14 >> Zosyn 5/19 >> 5/22 Meropenem 5/22 >> 5/30 Flagyl 5/30  >>off Ceftriaxone 5/30 >>off Fortaz 6/14 >>  SIGNIFICANT EVENTS: 5/19  Admit, cardiac arrest, transfer to ICU; hypothermia protocol started 5/24  Off pressors.  MRI w/out evidence of ischemia  6/03  Tolerated ATC couple hours, then back to vent, HD   LINES/TUBES: ETT 5/19 >> 5/31 Lt IJ CVL 5/19 >> out Trach 5/31 >>   ASSESSMENT / PLAN:  Discussion:  36 y/o F with PMH of poorly controlled DM, ESRD on HD and known gangrene of L achilles heel, admitted with non-healing wounds.  Admitted per TRH.  Received dilaudid for pain and subsequently suffered a cardiac arrest. Tx to ICU.   Pulmonary: Acute respiratory failure - in setting of cardiac arrest, chronic critical illness, requiring ventilator for support.   Trach placed 5/31- sutures removed 6/09  P: Continue PRVC as rest mode Daily SBTs Goal is PSV to ATC  Trach care per protocol  Intermittent CXR Albuterol PRN  SLP following  TF per nutrition - scheduled for PEG some time next week Push PT efforts as tolerated Infection source control as below for LLE   All other medical issues below per TRH, appreciate assistance.   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 Per ID recommend total 10 days of antibiotics.  Continue ceftriaxone, flagyl plus vancomycin. Dr. 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 PCCM will continue to follow for trach care.  Will follow up 6/18 unless new needs arise.   Posey Boyer, AGACNP-BC [facility] Pgr: [phone] or if no answer [phone] 03/12/2017, 2:12 PM

## 2017-03-12 NOTE — Progress Notes (Signed)
Pharmacy Antibiotic Note  [name] is a 36 y.o. female admitted on 02/14/2017 with LLE TMA stump wound/gangrene. Ortho is monitoring however pt and family are currently refusing BKA . The patient was on empiric antibiotics from 5/19 - 6/8. Antibiotics were held as the wound appeared "dry and noninfectious" however now appears to be worsened per MD note with fevers so plan is to restart Vancomycin + Ceftazidime.  The patient's last Vancomycin dose was 1g on 6/7 - since that time the patient has received HD on 6/9 and 6/12 and tolerated well.   Plan: 1. Vancomycin 1500 mg IV x 1 to "re-load" after HD today 2. Start Vancomycin 1g IV post HD-TTS starting on 6/16 3. Start Ceftazidime 2g IV on TTS @ 1800 4. Will continue to follow HD schedule/duration, culture results, LOT, and antibiotic de-escalation plans   Height: 5' 8.5" (174 cm) Weight: 197 lb (89.4 kg) IBW/kg (Calculated) : 65.05  Temp (24hrs), Avg:99.9 F (37.7 C), Min:98.1 F (36.7 C), Max:102.6 F (39.2 C)   Recent Labs Lab 03/07/17 0725 03/07/17 0726 03/10/17 0721 03/10/17 0722 03/11/17 0842  WBC 24.4*  --  33.8*  --  28.5*  CREATININE  --  5.14*  --  6.92* 4.65*    Estimated Creatinine Clearance: 19.6 mL/min (A) (by C-G formula based on SCr of 4.65 mg/dL (H)).    Allergies  Allergen Reactions  . Aspirin Anaphylaxis  . Sulfur Hives  . Tramadol Hives and Other (See Comments)    Pt states she feels weird   . Gabapentin Other (See Comments)    Lethargic     Antimicrobials this admission: Vanc 5/19>> 5/25;  5/30 > 6/8 Zosyn 5/19>> 5/22 Clindamycin 5/19>> 5/21 Meropenem 5/22 >> 5/30 Ceftriax 5/30> 6/8 Metronid 5/30> 6/8  Dose adjustments this admission: 5/25 pre-HD VR: 23 (on 1 g w/ HD)  Microbiology results: 5/19 BCx: ngf 5/19: MRSA PCR: neg 6/1 BCx: ngf 6/1 CDiff: neg 6/6 CDiff >> neg 6/13 BCx >>  Thank you for allowing pharmacy to be a part of this patient's care.  Georgina Pillion, PharmD,  BCPS Clinical Pharmacist Pager: [phone] Clinical phone for 03/12/2017 from 7a-3:30p: [phone] If after 3:30p, please call main pharmacy at: [phone] 03/12/2017 9:14 AM

## 2017-03-12 NOTE — Consult Note (Signed)
Reason for Consult:  L LE gangrene Referring Physician:  Dr. Theressa Millard Mcmaster is an 36 y.o. female.  HPI:  36 y/o female with PMH of diabetes, CKD and CHF is now on her 26th day of admission.  She is ventilator dependent with brain injury after cardiac arrest at the time of her admission.  She has a h/o hallux amputation last year by Dr. Sharol Given.  She underwent bilat transmet amputations in January of this year also by Dr. Sharol Given.  She healed the right foot but has had persistent drainage and wound dehiscence since her surgery on the L.  Dr. Sharol Given has offered L BKA as definitive treatment of the L LE chronic infection.  I'm asked to provide a second opinion for care of her chronic L LE wounds and infection.  She is on abx now per ID.  She has had persistent fevers and leukocytosis despite abx.  Past Medical History:  Diagnosis Date  . Anemia   . Arthritis    "left hand" (09/15/2013)  . Asthma   . CHF (congestive heart failure) ([place])   . Chronic bronchitis ([place])    "just about q yr" (09/15/2013)  . Chronic kidney disease    "low kidney function" (09/15/2013) ,  [place]  . COPD (chronic obstructive pulmonary disease) ([place])   . Coronary artery disease   . Hyperlipidemia   . Hypertension   . Migraine    "get them alot" (09/15/2013)  . Myocardial infarction [facility]) 04/2015   NSTEMI  . Normal coronary arteries    by cardiac catheterization 09/20/13  . Peripheral vascular disease ([place])   . Pneumonia    "couple times; have it now" (09/15/2013)  . PONV (postoperative nausea and vomiting)   . Restless legs   . Shortness of breath    "just recently; related to the pneumonia" (09/15/2013)  . Type 1 diabetes mellitus ([place])    type 2    Past Surgical History:  Procedure Laterality Date  . AMPUTATION Right 06/25/2016   Procedure: Amputation Right Great Toe at the Metatarsophalangeal Joint;  Surgeon: Newt Minion, MD;  Location: [place];  Service: Orthopedics;  Laterality: Right;  . AMPUTATION  Bilateral 10/08/2016   Procedure: Bilateral Transmetatarsal Amputation;  Surgeon: Newt Minion, MD;  Location: [place];  Service: Orthopedics;  Laterality: Bilateral;  . AV FISTULA PLACEMENT Left 03/27/2015   Procedure: CREATION RADIAL CEPHALIC ARTERIOVENOUS FISTULA;  Surgeon: Angelia Mould, MD;  Location: [place];  Service: Vascular;  Laterality: Left;  . AV FISTULA PLACEMENT Left 11/23/2015   Procedure:  LEFT ARM BASILIC VEIN TRANSPOSITION  ;  Surgeon: Angelia Mould, MD;  Location: [place];  Service: Vascular;  Laterality: Left;  . CESAREAN SECTION  1999; 2006  . CORONARY ANGIOGRAM  09/20/2013   Procedure: CORONARY ANGIOGRAM;  Surgeon: Lorretta Harp, MD;  Location[facility] C[facility] LAB;  Service: Cardiovascular;;  . FINGER SURGERY Left 1985   3rd and 4th digits reconstructed after cut off" (09/15/2013)  . IR FLUORO GUIDE CV LINE RIGHT  03/11/2017  . IR US GUIDE VASC ACCESS RIGHT  03/11/2017  . PERIPHERAL VASCULAR CATHETERIZATION N/A 09/05/2016   Procedure: Abdominal Aortogram;  Surgeon: Elam Dutch, MD;  Location: [place] CV LAB;  Service: Cardiovascular;  Laterality: N/A;  . PERIPHERAL VASCULAR CATHETERIZATION Bilateral 09/05/2016   Procedure: Lower Extremity Angiography;  Surgeon: Elam Dutch, MD;  Location: [place] CV LAB;  Service: Cardiovascular;  Laterality: Bilateral;  . PERIPHERAL VASCULAR CATHETERIZATION  Right 09/05/2016   Procedure: Peripheral Vascular Balloon Angioplasty;  Surgeon: Elam Dutch, MD;  Location: [place] CV LAB;  Service: Cardiovascular;  Laterality: Right;  peroneal and AT  . SHOULDER ARTHROSCOPY WITH BICEPSTENOTOMY Right 05/10/2015   Procedure: RIGHT SHOULDER ARTHROSCOPY WITH BICEPS TENOTOMY, DEBRIDEMENT LABRAL TEAR;  Surgeon: Tania Ade, MD;  Location: [place];  Service: Orthopedics;  Laterality: Right;  Right shoulder arthroscopy biceps tenotomy, debridement labral tear  . STUMP REVISION Left 01/21/2017   Procedure: . Revision Left  Transmetatarsal Amputation;  Surgeon: Newt Minion, MD;  Location: [place];  Service: Orthopedics;  Laterality: Left;  . TONSILLECTOMY  1997  . TUBAL LIGATION  2006    Family History  Problem Relation Age of Onset  . Diabetes Mother   . Stroke Mother   . Hypertension Father   . Hyperlipidemia Father   . Cancer - Lung Father   . Diabetes Maternal Grandmother   . Cancer Paternal Grandmother     Social History:  reports that she has never smoked. She has never used smokeless tobacco. She reports that she does not drink alcohol or use drugs.  Allergies:  Allergies  Allergen Reactions  . Aspirin Anaphylaxis  . Sulfur Hives  . Tramadol Hives and Other (See Comments)    Pt states she feels weird   . Gabapentin Other (See Comments)    Lethargic     Medications: I have reviewed the patient's current medications.  Results for orders placed or performed during the hospital encounter of 02/14/17 (from the past 48 hour(s))  Glucose, capillary     Status: Abnormal   Collection Time: 03/10/17  6:56 PM  Result Value Ref Range   Glucose-Capillary 180 (H) 65 - 99 mg/dL  Glucose, capillary     Status: Abnormal   Collection Time: 03/10/17  8:42 PM  Result Value Ref Range   Glucose-Capillary 178 (H) 65 - 99 mg/dL  Glucose, capillary     Status: Abnormal   Collection Time: 03/11/17 12:27 AM  Result Value Ref Range   Glucose-Capillary 189 (H) 65 - 99 mg/dL  Glucose, capillary     Status: Abnormal   Collection Time: 03/11/17  5:10 AM  Result Value Ref Range   Glucose-Capillary 229 (H) 65 - 99 mg/dL  Glucose, capillary     Status: Abnormal   Collection Time: 03/11/17  8:30 AM  Result Value Ref Range   Glucose-Capillary 156 (H) 65 - 99 mg/dL  Culture, blood (routine x 2)     Status: None (Preliminary result)   Collection Time: 03/11/17  8:42 AM  Result Value Ref Range   Specimen Description BLOOD RIGHT HAND    Special Requests      BOTTLES DRAWN AEROBIC ONLY Blood Culture adequate  volume   Culture NO GROWTH 1 DAY    Report Status PENDING   Renal function panel     Status: Abnormal   Collection Time: 03/11/17  8:42 AM  Result Value Ref Range   Sodium 140 135 - 145 mmol/L   Potassium 4.0 3.5 - 5.1 mmol/L   Chloride 98 (L) 101 - 111 mmol/L   CO2 27 22 - 32 mmol/L   Glucose, Bld 175 (H) 65 - 99 mg/dL   BUN 74 (H) 6 - 20 mg/dL   Creatinine, Ser 4.65 (H) 0.44 - 1.00 mg/dL   Calcium 9.3 8.9 - 10.3 mg/dL   Phosphorus 3.4 2.5 - 4.6 mg/dL   Albumin 2.5 (L) 3.5 - 5.0 g/dL  GFR calc non Af Amer 11 (L) >60 mL/min   GFR calc Af Amer 13 (L) >60 mL/min    Comment: (NOTE) The eGFR has been calculated using the CKD EPI equation. This calculation has not been validated in all clinical situations. eGFR's persistently <60 mL/min signify possible Chronic Kidney Disease.    Anion gap 15 5 - 15  CBC with Differential/Platelet     Status: Abnormal   Collection Time: 03/11/17  8:42 AM  Result Value Ref Range   WBC 28.5 (H) 4.0 - 10.5 K/uL   RBC 3.13 (L) 3.87 - 5.11 MIL/uL   Hemoglobin 9.1 (L) 12.0 - 15.0 g/dL   HCT 30.5 (L) 36.0 - 46.0 %   MCV 97.4 78.0 - 100.0 fL   MCH 29.1 26.0 - 34.0 pg   MCHC 29.8 (L) 30.0 - 36.0 g/dL   RDW 18.4 (H) 11.5 - 15.5 %   Platelets 377 150 - 400 K/uL   Neutrophils Relative % 81 %   Lymphocytes Relative 8 %   Monocytes Relative 9 %   Eosinophils Relative 2 %   Basophils Relative 0 %   Neutro Abs 23.0 (H) 1.7 - 7.7 K/uL   Lymphs Abs 2.3 0.7 - 4.0 K/uL   Monocytes Absolute 2.6 (H) 0.1 - 1.0 K/uL   Eosinophils Absolute 0.6 0.0 - 0.7 K/uL   Basophils Absolute 0.0 0.0 - 0.1 K/uL   RBC Morphology POLYCHROMASIA PRESENT    WBC Morphology MILD LEFT SHIFT (1-5% METAS, OCC MYELO, OCC BANDS)   Culture, blood (routine x 2)     Status: None (Preliminary result)   Collection Time: 03/11/17  8:46 AM  Result Value Ref Range   Specimen Description BLOOD RIGHT HAND    Special Requests      BOTTLES DRAWN AEROBIC AND ANAEROBIC Blood Culture adequate  volume   Culture NO GROWTH 1 DAY    Report Status PENDING   Glucose, capillary     Status: Abnormal   Collection Time: 03/11/17  1:02 PM  Result Value Ref Range   Glucose-Capillary 142 (H) 65 - 99 mg/dL  Glucose, capillary     Status: Abnormal   Collection Time: 03/11/17  4:29 PM  Result Value Ref Range   Glucose-Capillary 131 (H) 65 - 99 mg/dL  Glucose, capillary     Status: Abnormal   Collection Time: 03/11/17  7:58 PM  Result Value Ref Range   Glucose-Capillary 164 (H) 65 - 99 mg/dL   Comment 1 Notify RN    Comment 2 Document in Chart   Glucose, capillary     Status: Abnormal   Collection Time: 03/12/17 12:17 AM  Result Value Ref Range   Glucose-Capillary 192 (H) 65 - 99 mg/dL  Glucose, capillary     Status: Abnormal   Collection Time: 03/12/17  3:48 AM  Result Value Ref Range   Glucose-Capillary 181 (H) 65 - 99 mg/dL  CBC with Differential/Platelet     Status: Abnormal   Collection Time: 03/12/17  7:02 AM  Result Value Ref Range   WBC 24.0 (H) 4.0 - 10.5 K/uL   RBC 3.05 (L) 3.87 - 5.11 MIL/uL   Hemoglobin 8.9 (L) 12.0 - 15.0 g/dL   HCT 29.5 (L) 36.0 - 46.0 %   MCV 96.7 78.0 - 100.0 fL   MCH 29.2 26.0 - 34.0 pg   MCHC 30.2 30.0 - 36.0 g/dL   RDW 18.1 (H) 11.5 - 15.5 %   Platelets 380 150 - 400 K/uL  Neutrophils Relative % 84 %   Neutro Abs 20.2 (H) 1.7 - 7.7 K/uL   Lymphocytes Relative 6 %   Lymphs Abs 1.5 0.7 - 4.0 K/uL   Monocytes Relative 8 %   Monocytes Absolute 1.8 (H) 0.1 - 1.0 K/uL   Eosinophils Relative 2 %   Eosinophils Absolute 0.6 0.0 - 0.7 K/uL   Basophils Relative 0 %   Basophils Absolute 0.1 0.0 - 0.1 K/uL  Glucose, capillary     Status: Abnormal   Collection Time: 03/12/17  8:23 AM  Result Value Ref Range   Glucose-Capillary 212 (H) 65 - 99 mg/dL  Glucose, capillary     Status: Abnormal   Collection Time: 03/12/17 12:51 PM  Result Value Ref Range   Glucose-Capillary 146 (H) 65 - 99 mg/dL  Glucose, capillary     Status: Abnormal   Collection  Time: 03/12/17  3:43 PM  Result Value Ref Range   Glucose-Capillary 156 (H) 65 - 99 mg/dL    Ir Fluoro Guide Cv Line Right  Result Date: 03/11/2017 INDICATION: Osteomyelitis EXAM: TUNNELED RIGHT JUGULAR PICC LINE PLACEMENT WITH ULTRASOUND AND FLUOROSCOPIC GUIDANCE MEDICATIONS: None. ANESTHESIA/SEDATION: None FLUOROSCOPY TIME:  Fluoroscopy Time:  minutes 30 seconds (1 mGy). COMPLICATIONS: None immediate. PROCEDURE: The patient was advised of the possible risks and complications and agreed to undergo the procedure. The patient was then brought to the angiographic suite for the procedure. The right neck was prepped with chlorhexidine, draped in the usual sterile fashion using maximum barrier technique (cap and mask, sterile gown, sterile gloves, large sterile sheet, hand hygiene and cutaneous antiseptic). Local anesthesia was attained by infiltration with 1% lidocaine. Ultrasound demonstrated patency of the right jugular vein, and this was documented with an image. Under real-time ultrasound guidance, this vein was accessed with a 21 gauge micropuncture needle and image documentation was performed. A long subcutaneous tract was employed. The needle was exchanged over a guidewire for a peel-away sheath through which a 26 cm 5 [place] double lumen power injectable PICC was advanced, and positioned with its tip at the lower SVC/right atrial junction. The cuff was positioned in the subcutaneous tract. Fluoroscopy during the procedure and fluoro spot radiograph confirms appropriate catheter position. The catheter was flushed, secured to the skin with Prolene sutures, and covered with a sterile dressing. IMPRESSION: Successful placement of a tunneled right jugular PICC with sonographic and fluoroscopic guidance. The catheter is ready for use. Electronically Signed   By: Marybelle Killings M.D.   On: 03/11/2017 15:31   Ir US Guide Vasc Access Right  Result Date: 03/11/2017 INDICATION: Osteomyelitis EXAM: TUNNELED RIGHT  JUGULAR PICC LINE PLACEMENT WITH ULTRASOUND AND FLUOROSCOPIC GUIDANCE MEDICATIONS: None. ANESTHESIA/SEDATION: None FLUOROSCOPY TIME:  Fluoroscopy Time:  minutes 30 seconds (1 mGy). COMPLICATIONS: None immediate. PROCEDURE: The patient was advised of the possible risks and complications and agreed to undergo the procedure. The patient was then brought to the angiographic suite for the procedure. The right neck was prepped with chlorhexidine, draped in the usual sterile fashion using maximum barrier technique (cap and mask, sterile gown, sterile gloves, large sterile sheet, hand hygiene and cutaneous antiseptic). Local anesthesia was attained by infiltration with 1% lidocaine. Ultrasound demonstrated patency of the right jugular vein, and this was documented with an image. Under real-time ultrasound guidance, this vein was accessed with a 21 gauge micropuncture needle and image documentation was performed. A long subcutaneous tract was employed. The needle was exchanged over a guidewire for a peel-away sheath through which a  26 cm 5 [place] double lumen power injectable PICC was advanced, and positioned with its tip at the lower SVC/right atrial junction. The cuff was positioned in the subcutaneous tract. Fluoroscopy during the procedure and fluoro spot radiograph confirms appropriate catheter position. The catheter was flushed, secured to the skin with Prolene sutures, and covered with a sterile dressing. IMPRESSION: Successful placement of a tunneled right jugular PICC with sonographic and fluoroscopic guidance. The catheter is ready for use. Electronically Signed   By: Marybelle Killings M.D.   On: 03/11/2017 15:31   Dg Chest Port 1 View  Result Date: 03/11/2017 CLINICAL DATA:  Leukocytosis. EXAM: PORTABLE CHEST 1 VIEW COMPARISON:  03/03/2017 FINDINGS: Patient is rotated to the left. The cardio pericardial silhouette is enlarged. Slight improvement in left base aeration. Tracheostomy tube remains in place. A feeding  tube passes into the stomach although the distal tip position is not included on the film. The visualized bony structures of the thorax are intact. Telemetry leads overlie the chest. IMPRESSION: Persistent enlargement of the cardiopericardial silhouette without evidence for overt pulmonary edema or substantial pleural effusion. Electronically Signed   By: Misty Stanley M.D.   On: 03/11/2017 08:37    ROS:  + fever. PE:  Blood pressure 111/78, pulse (!) 122, temperature 99.6 F (37.6 C), temperature source Axillary, resp. rate (!) 24, height 5' 8.5" (1.74 m), weight 89.3 kg (196 lb 13.9 oz), SpO2 100 %. Chronically ill appearing woman in nad.  On vent via trach collar.  She appears to respond to questions with shaking her head.  She is not alert or oriented.  B LEs with PRAFO boots in place.  L LE with dry gangrene of foot distally and posteriorly.  Bone exposed at distal wound which is open.  No drainage is evident, thought there is a foul smell.  No cellulitis.  Active PF and DF strength appears to be 4/5.  Sens to LT intact about the foot.  DP and PT pulses are not palpable, but there is brisk cap refill distally.  Xray:  Osteomyelitis of the lesser metatarsals  Assessment/Plan: L LE gangrene and osteomyelitis - I explained the nature of the condition to the patient and her mother in detail.  I believe BKA is the only option to eradicate chronic infection of the L LE.  However, there is no guarantee that a BK would heal given her fragile medical condition.  She could end up needing revision to a higher level of amputation, and there is no guarantee that she could heal any level of amputation.  I'll sign off and leave her in Dr. Jess Barters care for any surgical needs.  The patient's mother states that she understands the plan and agrees to [place] by Dr. Sharol Given.  Wylene Simmer 03/12/2017, 4:55 PM

## 2017-03-12 NOTE — Progress Notes (Signed)
Daily Progress Note   Patient Name: [name]       Date: 03/12/2017 DOB: 1981/08/23  Age: 36 y.o. MRN#: [id number] Attending Physician: Jonetta Osgood, MD Primary Care Physician: Vicenta Aly, FNP Admit Date: 02/14/2017  Reason for Consultation/Follow-up: Establishing goals of care  Subjective:  I spoke with sister [name], mother [name], and boyfriend [name] after examining [name]. All agree that the goal is FULL SCOPE TREATMENT.  The family feels that [name] has improved over the past two weeks and they want to give every opportunity to continue to improve.  The have been told that [name] is very fragile, she will likely live in a facility for the rest of her life, and that she is at very high risk for recurrent infection (pneumonia, HD cath, feeding tube, wounds).  They accept these risks and request full scope treatment.  Consequently they are supportive of - (1) amputation of the left leg       (2) placement of PEG tube for feeding       (3) continued HD       (4) utilization of passy-muir valve to speak  The family stressed that in the past [name] "had a balloon run in her right leg" prior to amputation and that is why they feel it healed.  They request that every measure be taken to ensure the left leg will heal after amputation.  I assured them we would perform all appropriate procedures and take all measures possible to ensure it would heal - but there is still a significant possibility that it will not heal given her poor nutritional status.      Case discussed with [facility] attending and Ortho surgery.   Patient Profile/HPI: 36 yo female with ESRD on HD, DM1, COPD, CAD, PVD with dry gangrene in her left lower extremity who was admitted and suffered asystole cardiac arrest. She now  has an anoxic brain injury.  (6/14) she continues to spike fevers.     Length of Stay: 26  Current Medications: Scheduled Meds:  . calcium carbonate (dosed in mg elemental calcium)  2,000 mg of elemental calcium Oral TID  . chlorhexidine gluconate (MEDLINE KIT)  15 mL Mouth Rinse BID  . darbepoetin (ARANESP) injection - DIALYSIS  200 mcg Intravenous Q Tue-HD  . feeding supplement (NEPRO CARB STEADY)  1,000 mL Per Tube Q24H  . feeding supplement (PRO-STAT SUGAR FREE 64)  60 mL Per Tube QID  . [name]'s butt cream   Topical QID  . heparin  5,000 Units Subcutaneous Q8H  . insulin aspart  0-15 Units Subcutaneous Q4H  . mouth rinse  15 mL Mouth Rinse QID  . metoprolol succinate  25 mg Oral QHS  . multivitamin  1 tablet Oral QHS  . pantoprazole sodium  40 mg Per Tube Q24H    Continuous Infusions: . sodium chloride 10 mL/hr at 03/03/17 1700  . sodium chloride 20 mL/hr at 03/07/17 0413  . sodium chloride    . sodium chloride    . sodium chloride    . cefTAZidime (FORTAZ)  IV    . vancomycin    . [START ON 03/14/2017] vancomycin      PRN Meds: sodium chloride, sodium chloride, sodium chloride, acetaminophen (TYLENOL) oral liquid 160 mg/5 mL, albuterol, alteplase, fentaNYL (SUBLIMAZE) injection, heparin, lidocaine (PF), lidocaine, lidocaine-prilocaine, metoprolol tartrate, pentafluoroprop-tetrafluoroeth  Physical Exam        Well developed female awake. Able to follow simple commands (raise 1 finger, raise 2 fingers).  N/G in place  CV tachy Resp no distress but sounds wet Abdomen soft  Vital Signs: BP (!) 104/59   Pulse (!) 122   Temp 99.3 F (37.4 C) (Oral)   Resp (!) 26   Ht 5' 8.5" (1.74 m)   Wt 89.3 kg (196 lb 13.9 oz)   LMP  (LMP Unknown) Comment: Patient trached  SpO2 100%   BMI 29.50 kg/m  SpO2: SpO2: 100 % O2 Device: O2 Device: Ventilator O2 Flow Rate: O2 Flow Rate (L/min): 10 L/min  Intake/output summary:  Intake/Output Summary (Last 24 hours) at 03/12/17  1153 Last data filed at 03/12/17 0830  Gross per 24 hour  Intake              180 ml  Output                0 ml  Net              180 ml   LBM: Last BM Date: 03/10/17 Baseline Weight: Weight: 108 kg (238 lb) Most recent weight: Weight: 89.3 kg (196 lb 13.9 oz)       Palliative Assessment/Data:    Flowsheet Rows     Most Recent Value  Intake Tab  Referral Department  Hospitalist  Unit at Time of Referral  ICU  Palliative Care Primary Diagnosis  Nephrology  Date Notified  03/03/17  Palliative Care Type  New Palliative care  Reason for referral  Clarify Goals of Care  Date of Admission  02/14/17  Date first seen by Palliative Care  03/04/17  # of days Palliative referral response time  1 Day(s)  # of days IP prior to Palliative referral  17  Clinical Assessment  Palliative Performance Scale Score  20%  Psychosocial & Spiritual Assessment  Palliative Care Outcomes  Patient/Family meeting held?  Yes  Who was at the meeting?  mother and sister  Palliative Care Outcomes  Clarified goals of care      Patient Active Problem List   Diagnosis Date Noted  . Anoxic brain injury ([place])   . Palliative care encounter   . Goals of care, counseling/discussion   . Tachycardia   . DNR (do not resuscitate) discussion 03/04/2017  . Palliative care by specialist 03/04/2017  . Protein calorie malnutrition ([place])   . Acute  on chronic respiratory failure with hypoxemia ([place])   . Bacteremia   . Encounter for central line placement   . Elevated liver enzymes   . Jaundice   . Wound dehiscence   . Coma ([place])   . Osteomyelitis ([place]) 02/14/2017  . Pressure injury of skin 02/14/2017  . Cardiac arrest, cause unspecified ([place])   . Dehiscence of amputation stump ([place])   . Status post transmetatarsal amputation of left foot ([place]) 10/21/2016  . Status post transmetatarsal amputation of right foot ([place]) 10/08/2016  . 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 Diabetic osteomyelitis ([place])   . Type 1 diabetes mellitus with nephropathy ([place])   . Left foot infection 06/20/2016  . ESRD (end stage renal disease) ([place]) 06/20/2016  . Diabetic foot ulcer ([place]) 06/20/2016  . Chronic pain syndrome 02/24/2016  . Normal coronary arteries 10/12/2015  . NSTEMI- type 2, Troponin 11.2 05/11/2015  . Acute respiratory failure ([place]) 05/10/2015  . History of arthroscopy of right shoulder-05/10/15 05/10/2015  . CKD (chronic kidney disease) stage 5, GFR less than 15 ml/min (HCC) 09/21/2014  . Unspecified asthma(493.90) 10/25/2013  . Acute combined systolic and diastolic heart failure ([place]) 09/21/2013  . Non-ischemic cardiomyopathy - by echo 8/16- EF 35-40% 09/16/2013  . Morbid obesity-  09/16/2013  . Uncontrolled type 2 diabetes with renal manifestation ([place]) 09/15/2013  . Normocytic anemia 09/15/2013  . Lower extremity edema 09/15/2013  . Hyperlipidemia   . Hypertension     Palliative Care Plan    Recommendations/Plan:  Full scope treatment.  Move forward with amputation of left leg and subsequent placement of PEG.  Palliative meeting planned with Mother and sister on Friday afternoon.  Will discuss code status and future expectations.   Code Status:  Full code  Prognosis:   Unable to determine   Discharge Planning:  Likely Vent SNF that supports HD.   Would recommend Palliative to follow at [place] was discussed with attending MD and patient's family.  Thank you for allowing the Palliative Medicine Team to assist in the care of this patient.  Total time spent:  55 m     Greater than 50%  of this time was spent counseling and coordinating care related to the above assessment and plan.  Imogene Burn, PA-C Palliative Medicine  Please contact Palliative MedicineTeam phone at [phone] for questions and concerns between 7 am - 7 pm.   Please see AMION for individual provider pager numbers.

## 2017-03-12 NOTE — Progress Notes (Signed)
SLP Cancellation Note  Patient Details Name: [name] MRN: [id number] DOB: 07/05/1981   Cancelled treatment:       Reason Eval/Treat Not Completed: Medical issues which prohibited therapy. Per RN, not appropriate for trach collar today due to temp spikes, worsening of foot, possible BKA tomorrow.   [name] MA, CCC-SLP [phone]    [name] 03/12/2017, 11:31 AM

## 2017-03-12 NOTE — Progress Notes (Addendum)
[facility] Progress Note   Subjective: no change overnight; met with PC team and plan is to proceed with leg amputation and PEG tube, vent SNF  Objective Vitals:   03/12/17 1145 03/12/17 1200 03/12/17 1215 03/12/17 1222  BP: 107/77 103/74 108/77 108/77  Pulse: (!) 119 (!) 130 (!) 131 (!) 132  Resp: (!) 26 (!) 31 (!) 28 (!) 26  Temp:      TempSrc:      SpO2: 100% 100% 100% 100%  Weight:      Height:       Physical Exam General: chronically ill appearing female in NAD. Eyes open, not tracking objects.  Heart: B7,J6, 2/^ systolic M. No JVD. HR 120s Lungs: trach to vent bilateral breath sounds-few coarse upper airway BS.  Abdomen: active BS Feeding tube present. Extremities: Bilateral heel protectors. No LE edema.  Dialysis Access: LUA AVF + bruit faint in arterial portion of AVF  Dialysis: [facility] TTS   4h 86mn  F200   114kg  2/2 bath  LUA AVF  Hep none - Iron Sucrose (Venofer) 50 mg IVP During Dialysis 1X Week  - Mircera 75 mcg IV q 2 weeks ( last on 02/05/17) - Vitamin D (Calcitriol) Oral0.75 mcg Po q hd    Assess: 1. 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 VDRF: Trach to vent, full support Fio2 30%.  3. L foot osteo: needs amp, see pall care notes 4. ESRD - HD TTS, 4h 171m 5. Anemia - HGB 8.4. Cont ESA. No Fe D/T osteo.  6. Secondary hyperparathyroidism - Ca 9.2 Phos 3.9, had enough Ca++ supplementation> dec to 250 tid CaCO3  7. HTN/volume - BP good, no vol excess one xam, change MTP to home coreg at 12.5 bid (25 bid at home), add isosorbide mononitrate soon if BP will tolerate (20 tid = home dose) 8. Nutrition: Albumin 2.2 On tube feeding prostat/nepro  9. DM: per primary 10. NICM H/O diastolic/systolic HF  Plan - HD today, coreg, possible leg amp/ PEG soon   RoKelly SplinterD Ca[place]ager [phone] 03/10/2017, 12:55 PM    Additional Objective Labs: Basic Metabolic Panel:  Recent Labs Lab 03/07/17 0726  03/10/17 0722 03/11/17 0842  NA 140 142 140  K 3.1* 3.2* 4.0  CL 98* 101 98*  CO2 _0 GLUCOSE 144* 173* 175*  BUN 82* 131* 74*  CREATININE 5.14* 6.92* 4.65*  CALCIUM 9.2 9.6 9.3  PHOS 3.9 4.4 3.4   Liver Function Tests:  Recent Labs Lab 03/07/17 0726 03/10/17 0722 03/11/17 0842  ALBUMIN 2.2* 2.2* 2.5*   No results for input(s): LIPASE, AMYLASE in the last 168 hours. CBC:  Recent Labs Lab 03/07/17 0725 03/10/17 0721 03/11/17 0842 03/12/17 0702  WBC 24.4* 33.8* 28.5* 24.0*  NEUTROABS  --   --  23.0* 20.2*  HGB 8.4* 8.9* 9.1* 8.9*  HCT 28.5* 29.9* 30.5* 29.5*  MCV 97.3 96.8 97.4 96.7  PLT 559* 460* 377 380   Blood Culture    Component Value Date/Time   SDES BLOOD RIGHT HAND 02/27/2017 1049   SPECREQUEST IN PEDIATRIC BOTTLE Blood Culture adequate volume 02/27/2017 1049   CULT NO GROWTH 5 DAYS 02/27/2017 1049   REPTSTATUS 03/04/2017 FINAL 02/27/2017 1049    Cardiac Enzymes: No results for input(s): CKTOTAL, CKMB, CKMBINDEX, TROPONINI in the last 168 hours. CBG:  Recent Labs Lab 03/11/17 1629 03/11/17 1958 03/12/17 0017 03/12/17 0348 03/12/17 0823  GLUCAP 131* 164* 192*  181* 212*   Iron Studies: No results for input(s): IRON, TIBC, TRANSFERRIN, FERRITIN in the last 72 hours. _0 @ Studies/Results: Ir Fluoro Guide Cv Line Right  Result Date: 03/11/2017 INDICATION: Osteomyelitis EXAM: TUNNELED RIGHT JUGULAR PICC LINE PLACEMENT WITH ULTRASOUND AND FLUOROSCOPIC GUIDANCE MEDICATIONS: None. ANESTHESIA/SEDATION: None FLUOROSCOPY TIME:  Fluoroscopy Time:  minutes 30 seconds (1 mGy). COMPLICATIONS: None immediate. PROCEDURE: The patient was advised of the possible risks and complications and agreed to undergo the procedure. The patient was then brought to the angiographic suite for the procedure. The right neck was prepped with chlorhexidine, draped in the usual sterile fashion using maximum barrier technique (cap and mask, sterile gown, sterile gloves,  large sterile sheet, hand hygiene and cutaneous antiseptic). Local anesthesia was attained by infiltration with 1% lidocaine. Ultrasound demonstrated patency of the right jugular vein, and this was documented with an image. Under real-time ultrasound guidance, this vein was accessed with a 21 gauge micropuncture needle and image documentation was performed. A long subcutaneous tract was employed. The needle was exchanged over a guidewire for a peel-away sheath through which a 26 cm 5 [place] double lumen power injectable PICC was advanced, and positioned with its tip at the lower SVC/right atrial junction. The cuff was positioned in the subcutaneous tract. Fluoroscopy during the procedure and fluoro spot radiograph confirms appropriate catheter position. The catheter was flushed, secured to the skin with Prolene sutures, and covered with a sterile dressing. IMPRESSION: Successful placement of a tunneled right jugular PICC with sonographic and fluoroscopic guidance. The catheter is ready for use. Electronically Signed   By: Marybelle Killings M.D.   On: 03/11/2017 15:31   Ir US Guide Vasc Access Right  Result Date: 03/11/2017 INDICATION: Osteomyelitis EXAM: TUNNELED RIGHT JUGULAR PICC LINE PLACEMENT WITH ULTRASOUND AND FLUOROSCOPIC GUIDANCE MEDICATIONS: None. ANESTHESIA/SEDATION: None FLUOROSCOPY TIME:  Fluoroscopy Time:  minutes 30 seconds (1 mGy). COMPLICATIONS: None immediate. PROCEDURE: The patient was advised of the possible risks and complications and agreed to undergo the procedure. The patient was then brought to the angiographic suite for the procedure. The right neck was prepped with chlorhexidine, draped in the usual sterile fashion using maximum barrier technique (cap and mask, sterile gown, sterile gloves, large sterile sheet, hand hygiene and cutaneous antiseptic). Local anesthesia was attained by infiltration with 1% lidocaine. Ultrasound demonstrated patency of the right jugular vein, and this was  documented with an image. Under real-time ultrasound guidance, this vein was accessed with a 21 gauge micropuncture needle and image documentation was performed. A long subcutaneous tract was employed. The needle was exchanged over a guidewire for a peel-away sheath through which a 26 cm 5 [place] double lumen power injectable PICC was advanced, and positioned with its tip at the lower SVC/right atrial junction. The cuff was positioned in the subcutaneous tract. Fluoroscopy during the procedure and fluoro spot radiograph confirms appropriate catheter position. The catheter was flushed, secured to the skin with Prolene sutures, and covered with a sterile dressing. IMPRESSION: Successful placement of a tunneled right jugular PICC with sonographic and fluoroscopic guidance. The catheter is ready for use. Electronically Signed   By: Marybelle Killings M.D.   On: 03/11/2017 15:31   Dg Chest Port 1 View  Result Date: 03/11/2017 CLINICAL DATA:  Leukocytosis. EXAM: PORTABLE CHEST 1 VIEW COMPARISON:  03/03/2017 FINDINGS: Patient is rotated to the left. The cardio pericardial silhouette is enlarged. Slight improvement in left base aeration. Tracheostomy tube remains in place. A feeding tube passes into the stomach although the distal  tip position is not included on the film. The visualized bony structures of the thorax are intact. Telemetry leads overlie the chest. IMPRESSION: Persistent enlargement of the cardiopericardial silhouette without evidence for overt pulmonary edema or substantial pleural effusion. Electronically Signed   By: Misty Stanley M.D.   On: 03/11/2017 08:37   Medications: . sodium chloride 10 mL/hr at 03/03/17 1700  . sodium chloride 20 mL/hr at 03/07/17 0413  . sodium chloride    . sodium chloride    . sodium chloride    . [START ON 03/13/2017]  ceFAZolin (ANCEF) IV    . cefTAZidime (FORTAZ)  IV    . vancomycin    . [START ON 03/14/2017] vancomycin     . calcium carbonate (dosed in mg elemental  calcium)  2,000 mg of elemental calcium Oral TID  . chlorhexidine gluconate (MEDLINE KIT)  15 mL Mouth Rinse BID  . darbepoetin (ARANESP) injection - DIALYSIS  200 mcg Intravenous Q Tue-HD  . feeding supplement (NEPRO CARB STEADY)  1,000 mL Per Tube Q24H  . feeding supplement (PRO-STAT SUGAR FREE 64)  60 mL Per Tube QID  . [name]'s butt cream   Topical QID  . heparin  5,000 Units Subcutaneous Q8H  . insulin aspart  0-15 Units Subcutaneous Q4H  . mouth rinse  15 mL Mouth Rinse QID  . metoprolol succinate  25 mg Oral QHS  . multivitamin  1 tablet Oral QHS  . pantoprazole sodium  40 mg Per Tube Q24H

## 2017-03-12 NOTE — Progress Notes (Signed)
Attempted to obtain consent for Left BKA performed by Dr. [name]- per patient's mother this is not the procedure or doctor she was expecting. Mother stated that her daughter would not allow Dr. [name] to perform and that she was under impression it was to calf. Explained that a BKA is on the calf. At this time she would like to speak to the MD prior to signing consent. Will be present in morning to sign consent when she feels comfortable to do so.   Noe Gens, RN

## 2017-03-13 ENCOUNTER — Other Ambulatory Visit (INDEPENDENT_AMBULATORY_CARE_PROVIDER_SITE_OTHER): Payer: Self-pay | Admitting: Family

## 2017-03-13 ENCOUNTER — Inpatient Hospital Stay (HOSPITAL_COMMUNITY): Payer: Medicaid Other | Admitting: Certified Registered Nurse Anesthetist

## 2017-03-13 ENCOUNTER — Encounter (HOSPITAL_COMMUNITY)
Admission: EM | Disposition: A | Discharge status: Skilled Nursing Facility | Payer: Self-pay | Source: Home / Self Care | Attending: Pulmonary Disease

## 2017-03-13 ENCOUNTER — Inpatient Hospital Stay (HOSPITAL_COMMUNITY): Payer: Medicaid Other

## 2017-03-13 ENCOUNTER — Encounter (HOSPITAL_COMMUNITY): Payer: Self-pay | Admitting: Anesthesiology

## 2017-03-13 DIAGNOSIS — L02416 Cutaneous abscess of left lower limb: Secondary | ICD-10-CM

## 2017-03-13 HISTORY — PX: AMPUTATION: SHX166

## 2017-03-13 LAB — TYPE AND SCREEN
ABO/RH(D): O POS
Antibody Screen: NEGATIVE

## 2017-03-13 LAB — CBC
HCT: 27.2 % — ABNORMAL LOW (ref 36.0–46.0)
Hemoglobin: 8.1 g/dL — ABNORMAL LOW (ref 12.0–15.0)
MCH: 29 pg (ref 26.0–34.0)
MCHC: 29.8 g/dL — ABNORMAL LOW (ref 30.0–36.0)
MCV: 97.5 fL (ref 78.0–100.0)
PLATELETS: 363 10*3/uL (ref 150–400)
RBC: 2.79 MIL/uL — AB (ref 3.87–5.11)
RDW: 18 % — ABNORMAL HIGH (ref 11.5–15.5)
WBC: 23.4 10*3/uL — AB (ref 4.0–10.5)

## 2017-03-13 LAB — RENAL FUNCTION PANEL
Albumin: 2.4 g/dL — ABNORMAL LOW (ref 3.5–5.0)
Anion gap: 14 (ref 5–15)
BUN: 45 mg/dL — ABNORMAL HIGH (ref 6–20)
CALCIUM: 9 mg/dL (ref 8.9–10.3)
CO2: 26 mmol/L (ref 22–32)
CREATININE: 3.78 mg/dL — AB (ref 0.44–1.00)
Chloride: 96 mmol/L — ABNORMAL LOW (ref 101–111)
GFR calc non Af Amer: 14 mL/min — ABNORMAL LOW (ref >60)
GFR, EST AFRICAN AMERICAN: 16 mL/min — AB (ref >60)
GLUCOSE: 172 mg/dL — AB (ref 65–99)
Phosphorus: 3 mg/dL (ref 2.5–4.6)
Potassium: 4.1 mmol/L (ref 3.5–5.1)
SODIUM: 136 mmol/L (ref 135–145)

## 2017-03-13 LAB — GLUCOSE, CAPILLARY
GLUCOSE-CAPILLARY: 161 mg/dL — AB (ref 65–99)
GLUCOSE-CAPILLARY: 155 mg/dL — AB (ref 65–99)
Glucose-Capillary: 104 mg/dL — ABNORMAL HIGH (ref 65–99)
Glucose-Capillary: 121 mg/dL — ABNORMAL HIGH (ref 65–99)
Glucose-Capillary: 131 mg/dL — ABNORMAL HIGH (ref 65–99)
Glucose-Capillary: 141 mg/dL — ABNORMAL HIGH (ref 65–99)

## 2017-03-13 LAB — ECHO INTRAOPERATIVE TEE
Height: 68.5 in
Weight: 3120 oz

## 2017-03-13 SURGERY — AMPUTATION, ABOVE KNEE
Anesthesia: General | Site: Leg Upper | Laterality: Left

## 2017-03-13 SURGERY — AMPUTATION BELOW KNEE
Anesthesia: General | Laterality: Left

## 2017-03-13 MED ORDER — ALTEPLASE 2 MG IJ SOLR: 2.0000 mg | Freq: Once | INTRAMUSCULAR | Status: DC | PRN

## 2017-03-13 MED ORDER — PHENYLEPHRINE HCL 10 MG/ML IJ SOLN
INTRAMUSCULAR | Status: DC | PRN
  Administered 2017-03-13 (×7): 80 ug via INTRAVENOUS

## 2017-03-13 MED ORDER — PENTAFLUOROPROP-TETRAFLUOROETH EX AERO: 1.0000 "application " | INHALATION_SPRAY | CUTANEOUS | Status: DC | PRN

## 2017-03-13 MED ORDER — HEPARIN SODIUM (PORCINE) 1000 UNIT/ML DIALYSIS: 1000.0000 [IU] | INTRAMUSCULAR | Status: DC | PRN

## 2017-03-13 MED ORDER — FENTANYL CITRATE (PF) 100 MCG/2ML IJ SOLN: 25.0000 ug | INTRAMUSCULAR | Status: DC | PRN

## 2017-03-13 MED ORDER — SODIUM CHLORIDE 0.9 % IV SOLN: 100.0000 mL | INTRAVENOUS | Status: DC | PRN

## 2017-03-13 MED ORDER — CEFAZOLIN SODIUM-DEXTROSE 2-4 GM/100ML-% IV SOLN
2.0000 g | INTRAVENOUS | Status: AC
  Administered 2017-03-13: 2 g via INTRAVENOUS
  Filled 2017-03-13 (×2): qty 100

## 2017-03-13 MED ORDER — LIDOCAINE HCL (PF) 1 % IJ SOLN
5.0000 mL | INTRAMUSCULAR | Status: DC | PRN
Start: 2017-03-13 — End: 2017-03-13

## 2017-03-13 MED ORDER — LIDOCAINE-PRILOCAINE 2.5-2.5 % EX CREA: 1.0000 "application " | TOPICAL_CREAM | CUTANEOUS | Status: DC | PRN

## 2017-03-13 MED ORDER — ALBUMIN HUMAN 5 % IV SOLN
INTRAVENOUS | Status: AC
  Filled 2017-03-13: qty 250

## 2017-03-13 MED ORDER — CHLORHEXIDINE GLUCONATE 4 % EX LIQD
60.0000 mL | Freq: Once | CUTANEOUS | Status: AC
  Administered 2017-03-13: 4 via TOPICAL
  Filled 2017-03-13: qty 60

## 2017-03-13 MED ORDER — 0.9 % SODIUM CHLORIDE (POUR BTL) OPTIME
TOPICAL | Status: DC | PRN
  Administered 2017-03-13: 1000 mL

## 2017-03-13 MED ORDER — ONDANSETRON HCL 4 MG PO TABS
4.0000 mg | ORAL_TABLET | Freq: Four times a day (QID) | ORAL | Status: DC | PRN
Start: 2017-03-13 — End: 2017-03-24

## 2017-03-13 MED ORDER — ACETAMINOPHEN 650 MG RE SUPP: 650.0000 mg | Freq: Four times a day (QID) | RECTAL | Status: DC | PRN

## 2017-03-13 MED ORDER — ALBUMIN HUMAN 5 % IV SOLN
12.5000 g | Freq: Once | INTRAVENOUS | Status: AC
  Administered 2017-03-13: 12.5 g via INTRAVENOUS

## 2017-03-13 MED ORDER — MIDAZOLAM HCL 2 MG/2ML IJ SOLN
INTRAMUSCULAR | Status: AC
  Filled 2017-03-13: qty 2

## 2017-03-13 MED ORDER — METOCLOPRAMIDE HCL 5 MG PO TABS: 5.0000 mg | ORAL_TABLET | Freq: Three times a day (TID) | ORAL | Status: DC | PRN

## 2017-03-13 MED ORDER — ONDANSETRON HCL 4 MG/2ML IJ SOLN: 4.0000 mg | Freq: Once | INTRAMUSCULAR | Status: DC | PRN

## 2017-03-13 MED ORDER — ACETAMINOPHEN 325 MG PO TABS
650.0000 mg | ORAL_TABLET | Freq: Four times a day (QID) | ORAL | Status: DC | PRN
  Administered 2017-03-23: 650 mg via ORAL
  Filled 2017-03-13 (×2): qty 2

## 2017-03-13 MED ORDER — LACTATED RINGERS IV SOLN: INTRAVENOUS | Status: DC | PRN

## 2017-03-13 MED ORDER — FENTANYL CITRATE (PF) 250 MCG/5ML IJ SOLN
INTRAMUSCULAR | Status: DC | PRN
  Administered 2017-03-13 (×2): 25 ug via INTRAVENOUS

## 2017-03-13 MED ORDER — FENTANYL CITRATE (PF) 250 MCG/5ML IJ SOLN
INTRAMUSCULAR | Status: AC
  Filled 2017-03-13: qty 5

## 2017-03-13 MED ORDER — EPHEDRINE SULFATE 50 MG/ML IJ SOLN
INTRAMUSCULAR | Status: DC | PRN
  Administered 2017-03-13: 5 mg via INTRAVENOUS

## 2017-03-13 MED ORDER — HYDROMORPHONE HCL 1 MG/ML IJ SOLN: 1.0000 mg | INTRAMUSCULAR | Status: DC | PRN

## 2017-03-13 MED ORDER — LIDOCAINE HCL (PF) 1 % IJ SOLN: 5.0000 mL | INTRAMUSCULAR | Status: DC | PRN

## 2017-03-13 MED ORDER — PROPOFOL 10 MG/ML IV BOLUS
INTRAVENOUS | Status: AC
  Filled 2017-03-13: qty 20

## 2017-03-13 MED ORDER — PHENYLEPHRINE HCL 10 MG/ML IJ SOLN
INTRAMUSCULAR | Status: DC | PRN
  Administered 2017-03-13: 20 ug/min via INTRAVENOUS

## 2017-03-13 MED ORDER — METOCLOPRAMIDE HCL 5 MG/ML IJ SOLN: 5.0000 mg | Freq: Three times a day (TID) | INTRAMUSCULAR | Status: DC | PRN

## 2017-03-13 MED ORDER — HEPARIN SODIUM (PORCINE) 1000 UNIT/ML DIALYSIS: 20.0000 [IU]/kg | INTRAMUSCULAR | Status: DC | PRN

## 2017-03-13 MED ORDER — ONDANSETRON HCL 4 MG/2ML IJ SOLN: 4.0000 mg | Freq: Four times a day (QID) | INTRAMUSCULAR | Status: DC | PRN

## 2017-03-13 SURGICAL SUPPLY — 33 items
BLADE SAW RECIP 87.9 MT (BLADE) ×4 IMPLANT
BLADE SURG 21 STRL SS (BLADE) ×16 IMPLANT
BNDG COHESIVE 6X5 TAN STRL LF (GAUZE/BANDAGES/DRESSINGS) ×8 IMPLANT
BNDG GAUZE ELAST 4 BULKY (GAUZE/BANDAGES/DRESSINGS) ×8 IMPLANT
CANISTER WOUND CARE 500ML ATS (WOUND CARE) ×4 IMPLANT
COVER SURGICAL LIGHT HANDLE (MISCELLANEOUS) ×4 IMPLANT
CUFF TOURNIQUET SINGLE 34IN LL (TOURNIQUET CUFF) ×4 IMPLANT
CUFF TOURNIQUET SINGLE 44IN (TOURNIQUET CUFF) IMPLANT
DRAPE INCISE IOBAN 66X45 STRL (DRAPES) ×8 IMPLANT
DRAPE U-SHAPE 47X51 STRL (DRAPES) ×4 IMPLANT
DRSG VAC ATS MED SENSATRAC (GAUZE/BANDAGES/DRESSINGS) ×4 IMPLANT
ELECT REM PT RETURN 9FT ADLT (ELECTROSURGICAL) ×4
ELECTRODE REM PT RTRN 9FT ADLT (ELECTROSURGICAL) ×2 IMPLANT
GLOVE BIOGEL PI IND STRL 9 (GLOVE) ×2 IMPLANT
GLOVE BIOGEL PI INDICATOR 9 (GLOVE) ×2
GLOVE SURG ORTHO 9.0 STRL STRW (GLOVE) ×4 IMPLANT
GOWN STRL REUS W/ TWL XL LVL3 (GOWN DISPOSABLE) ×4 IMPLANT
GOWN STRL REUS W/TWL XL LVL3 (GOWN DISPOSABLE) ×4
KIT BASIN OR (CUSTOM PROCEDURE TRAY) ×4 IMPLANT
KIT ROOM TURNOVER OR (KITS) ×4 IMPLANT
MANIFOLD NEPTUNE II (INSTRUMENTS) ×4 IMPLANT
NS IRRIG 1000ML POUR BTL (IV SOLUTION) ×4 IMPLANT
PACK ORTHO EXTREMITY (CUSTOM PROCEDURE TRAY) ×4 IMPLANT
PAD ARMBOARD 7.5X6 YLW CONV (MISCELLANEOUS) ×4 IMPLANT
PREVENA INCISION MGT 90 150 (MISCELLANEOUS) ×4 IMPLANT
SPONGE LAP 18X18 X RAY DECT (DISPOSABLE) IMPLANT
STAPLER VISISTAT 35W (STAPLE) IMPLANT
STOCKINETTE IMPERVIOUS LG (DRAPES) ×4 IMPLANT
SUT ETHILON 2 0 PSLX (SUTURE) ×16 IMPLANT
SUT SILK 2 0 (SUTURE) ×2
SUT SILK 2-0 18XBRD TIE 12 (SUTURE) ×2 IMPLANT
SUT VIC AB 1 CTX 27 (SUTURE) IMPLANT
TOWEL OR 17X26 10 PK STRL BLUE (TOWEL DISPOSABLE) ×4 IMPLANT

## 2017-03-13 NOTE — Plan of Care (Signed)
Problem: Education: Goal: Knowledge of [facility] Education information/materials will improve Outcome: Progressing Discussed patient's plan of care with her mother. Explained that she was supposed to have surgery 6/15 and plans for peg placement likely next week. Patient's mother agreeable to this; however, when asked to sign consent she was confused about the procedure and MD performing. Patient's mother would like to speak with MD and get clarity of this prior to signing consent. Tube feedings stopped at midnight to prepare for surgery.

## 2017-03-13 NOTE — Progress Notes (Signed)
Daily Progress Note   Patient Name: [name]       Date: 03/13/2017 DOB: Jun 22, 1981  Age: 36 y.o. MRN#: [id number] Attending Physician: Jonetta Osgood, MD Primary Care Physician: Vicenta Aly, FNP Admit Date: 02/14/2017  Reason for Consultation/Follow-up: Establishing goals of care  Subjective: Mother at bedside.  Sister was unable to arrive from [place] in time to meet with [place].   I spent time with Kendrick Fries and Mardene Celeste.  I shared the cognitive evaluation with Kendrick Fries so that she in turn can share it with Tameka when she arrives.  Provided listening and emotional support to [place].    She understands her daughter is at high risk for decompensation and at high risk not to recover from surgery - but she feels it is important to give her a chance for as much recovery as is possible.      Assessment: Calm.  Now on Trach collar.  Had successful brief trial with passy muir valve this morning and was able to say her name out loud.   Going for amputation of LLE.   Patient Profile/HPI: 36 yo female with ESRD on HD, DM1, COPD, CAD, PVD with dry gangrene in her left lower extremitywho was admitted and suffered asystole cardiac arrest. She now has an anoxic brain injury.     Length of Stay: 27  Current Medications: Scheduled Meds:  . calcium carbonate (dosed in mg elemental calcium)  250 mg of elemental calcium Oral TID  . carvedilol  12.5 mg Oral BID WC  . chlorhexidine gluconate (MEDLINE KIT)  15 mL Mouth Rinse BID  . darbepoetin (ARANESP) injection - DIALYSIS  200 mcg Intravenous Q Tue-HD  . feeding supplement (NEPRO CARB STEADY)  1,000 mL Per Tube Q24H  . feeding supplement (PRO-STAT SUGAR FREE 64)  60 mL Per Tube QID  . [name]'s butt cream   Topical QID  . heparin  5,000 Units  Subcutaneous Q8H  . insulin aspart  0-15 Units Subcutaneous Q4H  . mouth rinse  15 mL Mouth Rinse QID  . multivitamin  1 tablet Oral QHS  . pantoprazole sodium  40 mg Per Tube Q24H  . sodium chloride flush  10-40 mL Intracatheter Q12H    Continuous Infusions: . sodium chloride 10 mL/hr at 03/03/17 1700  . sodium chloride 20 mL/hr at 03/07/17  0413  . sodium chloride    . sodium chloride    . sodium chloride    .  ceFAZolin (ANCEF) IV    . cefTAZidime (FORTAZ)  IV    . [START ON 03/14/2017] vancomycin      PRN Meds: sodium chloride, sodium chloride, sodium chloride, acetaminophen (TYLENOL) oral liquid 160 mg/5 mL, albuterol, fentaNYL (SUBLIMAZE) injection, metoprolol tartrate, sodium chloride flush  Physical Exam       Awake, female in NAD, N/G in place on trach collar.  Smiles at me.  Follows 1 step commands.  Vital Signs: BP (!) 117/99 (BP Location: Left Arm)   Pulse (!) 117   Temp 98.6 F (37 C) (Oral)   Resp 20   Ht 5' 8.5" (1.74 m)   Wt 88.5 kg (195 lb)   LMP  (LMP Unknown) Comment: Patient trached  SpO2 94%   BMI 29.22 kg/m  SpO2: SpO2: 94 % O2 Device: O2 Device: Tracheostomy Collar O2 Flow Rate: O2 Flow Rate (L/min): 8 L/min  Intake/output summary:  Intake/Output Summary (Last 24 hours) at 03/13/17 1356 Last data filed at 03/13/17 9767  Gross per 24 hour  Intake          1622.25 ml  Output              200 ml  Net          1422.25 ml   LBM: Last BM Date: 03/13/17 Baseline Weight: Weight: 108 kg (238 lb) Most recent weight: Weight: 88.5 kg (195 lb)       Palliative Assessment/Data:    Flowsheet Rows     Most Recent Value  Intake Tab  Referral Department  Hospitalist  Unit at Time of Referral  ICU  Palliative Care Primary Diagnosis  Nephrology  Date Notified  03/03/17  Palliative Care Type  New Palliative care  Reason for referral  Clarify Goals of Care  Date of Admission  02/14/17  Date first seen by Palliative Care  03/04/17  # of days  Palliative referral response time  1 Day(s)  # of days IP prior to Palliative referral  17  Clinical Assessment  Palliative Performance Scale Score  20%  Psychosocial & Spiritual Assessment  Palliative Care Outcomes  Patient/Family meeting held?  Yes  Who was at the meeting?  mother and sister  Palliative Care Outcomes  Clarified goals of care      Patient Active Problem List   Diagnosis Date Noted  . Tracheostomy status ([place])   . Anoxic brain injury ([place])   . Palliative care encounter   . Goals of care, counseling/discussion   . Tachycardia   . DNR (do not resuscitate) discussion 03/04/2017  . Palliative care by specialist 03/04/2017  . Protein calorie malnutrition ([place])   . Acute on chronic respiratory failure with hypoxemia ([place])   . Bacteremia   . Encounter for central line placement   . Elevated liver enzymes   . Jaundice   . Wound dehiscence   . Coma ([place])   . Osteomyelitis ([place]) 02/14/2017  . Pressure injury of skin 02/14/2017  . Cardiac arrest, cause unspecified ([place])   . Dehiscence of amputation stump ([place])   . Status post transmetatarsal amputation of left foot ([place]) 10/21/2016  . Status post transmetatarsal amputation of right foot ([place]) 10/08/2016  . 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 Diabetic osteomyelitis ([place])   . 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  Left foot infection 06/20/2016  . ESRD (end stage renal disease) ([place]) 06/20/2016  . Diabetic foot ulcer ([place]) 06/20/2016  . Chronic pain syndrome 02/24/2016  . Normal coronary arteries 10/12/2015  . NSTEMI- type 2, Troponin 11.2 05/11/2015  . Acute respiratory failure ([place]) 05/10/2015  . History of arthroscopy of right shoulder-05/10/15 05/10/2015  . CKD (chronic kidney disease) stage 5, GFR less than 15 ml/min ([facility]) 09/21/2014  . Unspecified asthma(493.90) 10/25/2013  . Acute combined systolic and diastolic heart failure ([place])  09/21/2013  . Non-ischemic cardiomyopathy - by echo 8/16- EF 35-40% 09/16/2013  . Morbid obesity-  09/16/2013  . Uncontrolled type 2 diabetes with renal manifestation ([place]) 09/15/2013  . Normocytic anemia 09/15/2013  . Lower extremity edema 09/15/2013  . Hyperlipidemia   . Hypertension     Palliative Care Plan    Recommendations/Plan:  Full scope treatment  BKA  PEG  Progress as much as possible with SLP/ PT / OT  Plan for D/C to SNF that can provide HD, Trach care.   Family prefers [place], [place] if possible.  At this point PMT is providing emotional support - -  goals are determined.  We will follow peripherally and re-engage if the patient declines significantly or if we are called by the primary team.  Goals of Care and Additional Recommendations:  Limitations on Scope of Treatment: Full Scope Treatment  Code Status:  Full code  Prognosis:   Unable to determine.  PVD, non-healing wounds.  Going for amputation of LLE.  TBI   Discharge Planning:  [facility] for rehab with Palliative care service follow-up  Care plan was discussed with [facility] attending, patient's mother, pulmonary  Thank you for allowing the Palliative Medicine Team to assist in the care of this patient.  Total time spent:  35 min.     Greater than 50%  of this time was spent counseling and coordinating care related to the above assessment and plan.  Imogene Burn, PA-C Palliative Medicine  Please contact Palliative MedicineTeam phone at [phone] for questions and concerns between 7 am - 7 pm.   Please see AMION for individual provider pager numbers.

## 2017-03-13 NOTE — Anesthesia Postprocedure Evaluation (Signed)
Anesthesia Post Note  Patient: [name]  Procedure(s) Performed: Procedure(s) (LRB): AMPUTATION ABOVE KNEE (Left)     Patient location during evaluation: PACU Anesthesia Type: General Level of consciousness: lethargic and obtunded/minimal responses Pain management: pain level controlled Vital Signs Assessment: post-procedure vital signs reviewed and stable Respiratory status: spontaneous breathing, respiratory function stable and patient connected to tracheostomy mask oxygen Cardiovascular status: blood pressure returned to baseline Anesthetic complications: no    Last Vitals:  Vitals:   03/13/17 1700 03/13/17 1800  BP: 108/83 121/83  Pulse: (!) 112 (!) 112  Resp: 19 (!) 33  Temp:      Last Pain:  Vitals:   03/13/17 1555  TempSrc:   PainSc: Asleep                 [name]

## 2017-03-13 NOTE — Progress Notes (Signed)
PROGRESS NOTE        PATIENT DETAILS Name: [name] Age: 36 y.o. Sex: female Date of Birth: 1980/12/02 Admit Date: 02/14/2017 Admitting Physician Lady Deutscher, MD [id number], Helene Kelp, FNP  Note-care assumed by me on 6/13  Brief Narrative: Patient is a 36 y.o. female with history of ESRD on HD, poorly controlled diabetes, history of bilateral transmetatarsal foot amputation with subsequent left foot wound dehiscence (refused BKA as outpatient), chronic systolic heart failure felt to be secondary to nonischemic cardiomyopathy admitted on 02/14/17 with wound dehiscence and persistent drainage from the left trans-metatarsal amputation site, she was admitted to the [facility]. She was subsequently found to be unresponsive in her room on 5/19 after getting IV Dilaudid-she was  in asystole, CPR was started, with ROSC in around 9 minutes.She was then transferred to the intensive care unit, and subsequently underwent cooling and then rewarming. She was unable to be liberated off the ventilator, and as a result underwent tracheostomy on 5/31. Patient has been evaluated by neurology during this hospital stay, per neurology chances of meaningful neurological recovery is dismal. Hospital course has now been complicated by persistent leukocytosis, respiratory failure, ongoing wound dehiscence with dry gangrene. See below for further details   Subjective: Low grade fever overnight. Some foul smell to the left foot this morning. Mother at bedside.  Assessment/Plan: Acute hypoxemic respiratory failure in a setting of cardiac arrest: Continue ventilator support, [facility] following-tracheostomy in place. Will require long-term facility with the ventilator support on discharge.   Cardiac arrest on 5/19 with anoxic brain injury: Essentially unchanged, tracheostomy in place, requires a PEG tube-IR planning PEG tube sometime early next week. Evaluated by neurology on  5/29-felt to have poor overall prognoses for any meaningful recovery. Is able to follow some simple commands.   Systemic inflammatory response syndrome: Continues to have tachycardia, fever overnight-leukocytosis essentially unchanged. Left foot with some foul smelling odor today-minimal discharge is morning. Has been evaluated by 2 different orthopedics surgeon on 6/14-both agree for need to proceed with a BKA. Initially patient was reluctant as an outpatient-but given different circumstances-persistent SIRS syndrome-family (mother) is agreeable to proceed with BKA. She initially wanted Dr. Doran Durand to do the surgery, but Dr. Doran Durand (spoke over the phone) is not able to form BKA until mid to late next week. After further discussion-given persistent leukocytosis, fever-and the risk of worsening to sepsis syndrome-family now wants to proceed with BKA today by Dr. Sharol Given.  Blood cultures on 6/13 negative so far-remains on empiric vancomycin and Fortaz.  Note-2 RNs at bedside when discussions done this morning.  Left trans-metatarsal stump wound dehiscence with gangrene: See above  ESRD: Nephrology following-HD on TTS.   Anemia: Likely secondary to chronic disease-probably worsened by acute illness. No signs of bleeding, follow CBC.   DM-2: CBGs stable-continue with SSI.   Chronic systolic heart failure/nonischemic cardiomyopathy (EF 30-35% by TEE on 6/4): Volume status remained stable-this is managed with dialysis-continue with Coreg.   Pericardial effusion: Seen on TEE-with no evidence of tamponade pathophysiology. Repeat echo at some point in the next few weeks.  Moderate protein calorie malnutrition: Continue NG tube feedings  Goals of care: Unfortunate 36 year old female with ESRD, cardiomyopathy, bilateral transmetatarsal amputations complicated by left stump dehiscence-suffered a cardiac arrest on admission-now with anoxic brain injury-ventilator dependent-tracheostomy in place, awaiting PEG tube  placement next week.  Hospital course has been prolonged-complicated by failure to wean from the ventilator, persistent systemic inflammatory response syndrome due to left foot gangrene/infection-after extensive discussion with family-she remains a full code, with family choosing to continue with full aggressive care. She is for a left BKA today, following which hopefully we can get a PEG tube placed next week. She will require long-term SNF placement that has both ventilator and dialysis support. Family is aware of poor overall prognosis-long-term skilled nursing needs, and chances of further decompensation/deterioration.  Telemetry (independently reviewed): Sinus tachycardia  Studies: Lower extremity ultrasound 5/18 >> no evidence of DVT CT head 5/19 >> normal exam Echo 5/20 >> EF 30-35%, increased LVF. Diffuse hypokinesis, akinesis of basilar mid-inferior myocardium, grade 2 diastolic dysfxn  EEG 8/18 >> finding c/w mod to severe global cerebral dysfxn. C/w anoxic injury given clinical course  LE [place] 5/21 >> negative MRI brain 5/24 >> ?thrombosed cortical vein, otherwise normal TEE 6/4 >> mild MR, normal AV, mild TR, mild PR, EF 30-35%, diffuse hypokinesis, no thrombus, no PFO, normal RV, moderate pericardial effusion with synechia suggesting some chronicity, no vegetations   Echo (reviewed):EF 30-35% on TEE done on 6/4  Morning labs/Imaging ordered: yes  DVT Prophylaxis: Prophylactic Heparin   Code Status: Full code   Family Communication: Mother at bedside  Disposition Plan: Remain inpatient in SDU  Antimicrobial agents: Anti-infectives    Start     Dose/Rate Route Frequency Ordered Stop   03/14/17 1200  vancomycin (VANCOCIN) IVPB 1000 mg/200 mL premix     1,000 mg 200 mL/hr over 60 Minutes Intravenous Every T-Th-Sa (Hemodialysis) 03/12/17 0912     03/13/17 1430  ceFAZolin (ANCEF) IVPB 2g/100 mL premix     2 g 200 mL/hr over 30 Minutes Intravenous To [facility]  03/12/17 1208 03/14/17 1430   03/12/17 1800  cefTAZidime (FORTAZ) 2 g in dextrose 5 % 50 mL IVPB     2 g 100 mL/hr over 30 Minutes Intravenous Every T-Th-Sa (1800) 03/12/17 0912     03/12/17 1200  vancomycin (VANCOCIN) 1,500 mg in sodium chloride 0.9 % 250 mL IVPB     1,500 mg 250 mL/hr over 60 Minutes Intravenous Every Thu (Hemodialysis) 03/12/17 0912 03/12/17 1356   03/02/17 1200  cefTRIAXone (ROCEPHIN) 2 g in dextrose 5 % 50 mL IVPB     2 g 100 mL/hr over 30 Minutes Intravenous Every 24 hours 03/02/17 0810 03/06/17 1358   03/02/17 1200  vancomycin (VANCOCIN) IVPB 1000 mg/200 mL premix     1,000 mg 200 mL/hr over 60 Minutes Intravenous Every M-W-F (Hemodialysis) 03/02/17 0815 03/02/17 1457   03/02/17 1200  metroNIDAZOLE (FLAGYL) IVPB 500 mg     500 mg 100 mL/hr over 60 Minutes Intravenous Every 8 hours 03/02/17 0948 03/06/17 2230   02/26/17 1200  vancomycin (VANCOCIN) IVPB 1000 mg/200 mL premix     1,000 mg 200 mL/hr over 60 Minutes Intravenous Every T-Th-Sa (Hemodialysis) 02/25/17 1151 03/05/17 1325   02/25/17 1200  vancomycin (VANCOCIN) 2,000 mg in sodium chloride 0.9 % 500 mL IVPB     2,000 mg 250 mL/hr over 120 Minutes Intravenous  Once 02/25/17 1149 02/25/17 1449   02/25/17 0900  cefTRIAXone (ROCEPHIN) 2 g in dextrose 5 % 50 mL IVPB  Status:  Discontinued     2 g 100 mL/hr over 30 Minutes Intravenous Every 24 hours 02/25/17 0814 03/02/17 0810   02/25/17 0900  metroNIDAZOLE (FLAGYL) IVPB 500 mg  Status:  Discontinued     500  mg 100 mL/hr over 60 Minutes Intravenous Every 8 hours 02/25/17 0814 03/02/17 0948   02/20/17 1200  vancomycin (VANCOCIN) IVPB 1000 mg/200 mL premix  Status:  Discontinued     1,000 mg 200 mL/hr over 60 Minutes Intravenous Every M-W-F (Hemodialysis) 02/19/17 1506 02/20/17 1403   02/17/17 1800  meropenem (MERREM) 500 mg in sodium chloride 0.9 % 50 mL IVPB  Status:  Discontinued     500 mg 100 mL/hr over 30 Minutes Intravenous Daily-1800 02/17/17 1026  02/25/17 0814   02/17/17 1200  vancomycin (VANCOCIN) IVPB 1000 mg/200 mL premix     1,000 mg 200 mL/hr over 60 Minutes Intravenous Every T-Th-Sa (Hemodialysis) 02/14/17 1758 02/19/17 1612   02/14/17 2200  clindamycin (CLEOCIN) IVPB 900 mg  Status:  Discontinued     900 mg 100 mL/hr over 30 Minutes Intravenous Every 8 hours 02/14/17 1106 02/14/17 1928   02/14/17 2000  clindamycin (CLEOCIN) IVPB 900 mg  Status:  Discontinued     900 mg 100 mL/hr over 30 Minutes Intravenous Every 8 hours 02/14/17 1928 02/16/17 1047   02/14/17 1915  piperacillin-tazobactam (ZOSYN) IVPB 3.375 g  Status:  Discontinued     3.375 g 100 mL/hr over 30 Minutes Intravenous Every 12 hours 02/14/17 1801 02/17/17 1026   02/14/17 1845  vancomycin (VANCOCIN) 2,000 mg in sodium chloride 0.9 % 500 mL IVPB     2,000 mg 250 mL/hr over 120 Minutes Intravenous  Once 02/14/17 1758 02/14/17 2107   02/14/17 1115  clindamycin (CLEOCIN) IVPB 900 mg  Status:  Discontinued     900 mg 100 mL/hr over 30 Minutes Intravenous  Once 02/14/17 1106 02/14/17 2236   02/14/17 0945  clindamycin (CLEOCIN) IVPB 600 mg     600 mg 100 mL/hr over 30 Minutes Intravenous  Once 02/14/17 0931 02/14/17 1013      Procedures: ETT 5/19 >> 5/31 Lt IJ CVL 5/19 >> out Trach 5/31 >>  TEE 6/4 >> mild MR, normal AV, mild TR, mild PR, EF 30-35%, diffuse hypokinesis, no thrombus, no PFO, normal RV, moderate pericardial effusion with synechia suggesting some chronicity, no vegetations   CONSULTS:  Infectious disease:  Neurology  Gastroenterology  Wound care  PCCM  Nephrology  Orthopedics  Time spent: 30 minutes-Greater than 50% of this time was spent in counseling, explanation of diagnosis, planning of further management, and coordination of care.  MEDICATIONS: Scheduled Meds: . calcium carbonate (dosed in mg elemental calcium)  250 mg of elemental calcium Oral TID  . carvedilol  12.5 mg Oral BID WC  . chlorhexidine gluconate (MEDLINE KIT)   15 mL Mouth Rinse BID  . darbepoetin (ARANESP) injection - DIALYSIS  200 mcg Intravenous Q Tue-HD  . feeding supplement (NEPRO CARB STEADY)  1,000 mL Per Tube Q24H  . feeding supplement (PRO-STAT SUGAR FREE 64)  60 mL Per Tube QID  . [name]'s butt cream   Topical QID  . heparin  5,000 Units Subcutaneous Q8H  . insulin aspart  0-15 Units Subcutaneous Q4H  . mouth rinse  15 mL Mouth Rinse QID  . multivitamin  1 tablet Oral QHS  . pantoprazole sodium  40 mg Per Tube Q24H  . sodium chloride flush  10-40 mL Intracatheter Q12H   Continuous Infusions: . sodium chloride 10 mL/hr at 03/03/17 1700  . sodium chloride 20 mL/hr at 03/07/17 0413  . sodium chloride    . sodium chloride    . sodium chloride    .  ceFAZolin (ANCEF) IV    .  cefTAZidime (FORTAZ)  IV    . [START ON 03/14/2017] vancomycin     PRN Meds:.sodium chloride, sodium chloride, sodium chloride, acetaminophen (TYLENOL) oral liquid 160 mg/5 mL, albuterol, alteplase, fentaNYL (SUBLIMAZE) injection, heparin, lidocaine (PF), lidocaine, lidocaine-prilocaine, metoprolol tartrate, pentafluoroprop-tetrafluoroeth, sodium chloride flush   PHYSICAL EXAM: Vital signs: Vitals:   03/13/17 0300 03/13/17 0423 03/13/17 0500 03/13/17 0600  BP: 109/75 107/79 119/87 (!) 114/100  Pulse: (!) 123 (!) 124 (!) 123 (!) 126  Resp: 17 16 15 16   Temp:  (!) 100.7 F (38.2 C)    TempSrc:  Axillary    SpO2: 98% 99% 98% 100%  Weight:   88.5 kg (195 lb)   Height:       Filed Weights   03/12/17 0500 03/12/17 0830 03/13/17 0500  Weight: 89.4 kg (197 lb) 89.3 kg (196 lb 13.9 oz) 88.5 kg (195 lb)   Body mass index is 29.22 kg/m.  General appearance:Sleeping this morning-arousals-barely acknowledges me today. HEENT: Atraumatic and Normocephalic Neck: Tracheostomy in place. Resp:Good air entry bilaterally, some transmitted upper airway sounds CVS: S1 S2 regular, tachycardic GI: Bowel sounds present, Non tender and not distended with no gaurding,  rigidity or rebound. Extremities: Left foot-foul-smelling, minimal discharge-dry gangrene Musculoskeletal:No digital cyanosis Wounds:N/A  I have personally reviewed following labs and imaging studies  LABORATORY DATA: CBC:  Recent Labs Lab 03/07/17 0725 03/10/17 0721 03/11/17 0842 03/12/17 0702 03/13/17 0557  WBC 24.4* 33.8* 28.5* 24.0* 23.4*  NEUTROABS  --   --  23.0* 20.2*  --   HGB 8.4* 8.9* 9.1* 8.9* 8.1*  HCT 28.5* 29.9* 30.5* 29.5* 27.2*  MCV 97.3 96.8 97.4 96.7 97.5  PLT 559* 460* 377 380 638    Basic Metabolic Panel:  Recent Labs Lab 03/07/17 0726 03/10/17 0722 03/11/17 0842 03/13/17 0557  NA 140 142 140 136  K 3.1* 3.2* 4.0 4.1  CL 98* 101 98* 96*  CO2 26 23 27 26   GLUCOSE 144* 173* 175* 172*  BUN 82* 131* 74* 45*  CREATININE 5.14* 6.92* 4.65* 3.78*  CALCIUM 9.2 9.6 9.3 9.0  PHOS 3.9 4.4 3.4 3.0    GFR: Estimated Creatinine Clearance: 24 mL/min (A) (by C-G formula based on SCr of 3.78 mg/dL (H)).  Liver Function Tests:  Recent Labs Lab 03/07/17 0726 03/10/17 0722 03/11/17 0842 03/13/17 0557  ALBUMIN 2.2* 2.2* 2.5* 2.4*   No results for input(s): LIPASE, AMYLASE in the last 168 hours. No results for input(s): AMMONIA in the last 168 hours.  Coagulation Profile: No results for input(s): INR, PROTIME in the last 168 hours.  Cardiac Enzymes: No results for input(s): CKTOTAL, CKMB, CKMBINDEX, TROPONINI in the last 168 hours.  BNP (last 3 results) No results for input(s): PROBNP in the last 8760 hours.  HbA1C: No results for input(s): HGBA1C in the last 72 hours.  CBG:  Recent Labs Lab 03/12/17 1251 03/12/17 1543 03/12/17 2016 03/12/17 2351 03/13/17 0432  GLUCAP 146* 156* 162* 175* 161*    Lipid Profile: No results for input(s): CHOL, HDL, LDLCALC, TRIG, CHOLHDL, LDLDIRECT in the last 72 hours.  Thyroid Function Tests: No results for input(s): TSH, T4TOTAL, FREET4, T3FREE, THYROIDAB in the last 72 hours.  Anemia Panel: No  results for input(s): VITAMINB12, FOLATE, FERRITIN, TIBC, IRON, RETICCTPCT in the last 72 hours.  Urine analysis:    Component Value Date/Time   COLORURINE AMBER (A) 02/14/2017 2111   APPEARANCEUR HAZY (A) 02/14/2017 2111   LABSPEC 1.015 02/14/2017 2111   PHURINE 5.0 02/14/2017 2111  GLUCOSEU NEGATIVE 02/14/2017 2111   HGBUR SMALL (A) 02/14/2017 2111   BILIRUBINUR NEGATIVE 02/14/2017 2111   KETONESUR NEGATIVE 02/14/2017 2111   PROTEINUR 30 (A) 02/14/2017 2111   UROBILINOGEN 0.2 09/21/2014 1908   NITRITE NEGATIVE 02/14/2017 2111   LEUKOCYTESUR TRACE (A) 02/14/2017 2111    Sepsis Labs: Lactic Acid, Venous    Component Value Date/Time   LATICACIDVEN 4.6 ([place]) 02/14/2017 1807    MICROBIOLOGY: Recent Results (from the past 240 hour(s))  C difficile quick scan w PCR reflex     Status: None   Collection Time: 03/04/17  2:17 PM  Result Value Ref Range Status   C Diff antigen NEGATIVE NEGATIVE Final   C Diff toxin NEGATIVE NEGATIVE Final   C Diff interpretation No C. difficile detected.  Final  Culture, blood (routine x 2)     Status: None (Preliminary result)   Collection Time: 03/11/17  8:42 AM  Result Value Ref Range Status   Specimen Description BLOOD RIGHT HAND  Final   Special Requests   Final    BOTTLES DRAWN AEROBIC ONLY Blood Culture adequate volume   Culture NO GROWTH 1 DAY  Final   Report Status PENDING  Incomplete  Culture, blood (routine x 2)     Status: None (Preliminary result)   Collection Time: 03/11/17  8:46 AM  Result Value Ref Range Status   Specimen Description BLOOD RIGHT HAND  Final   Special Requests   Final    BOTTLES DRAWN AEROBIC AND ANAEROBIC Blood Culture adequate volume   Culture NO GROWTH 1 DAY  Final   Report Status PENDING  Incomplete    RADIOLOGY STUDIES/RESULTS: Ct Abdomen Wo Contrast  Result Date: 03/02/2017 CLINICAL DATA:  Preop for gastrostomy to EXAM: CT ABDOMEN WITHOUT CONTRAST TECHNIQUE: Multidetector CT imaging of the abdomen was  performed following the standard protocol without IV contrast. COMPARISON:  None. FINDINGS: Lower chest: Bibasilar dependent and medial consolidation. Large pericardial effusion. Hepatobiliary: The left lobe of the liver is prominent and anterior to the stoma. The gallbladder is not clearly visualized. It may either be decompressed or absent due to cholecystectomy. Pancreas: Unremarkable Spleen: Unremarkable Adrenals/Urinary Tract: Kidneys and adrenal glands are within normal limits. Stomach/Bowel: The stomach is positioned deep to the liver and colon. Gastrostomy tube placement may be problematic. No evidence of small-bowel obstruction. Feeding tube tip is in the proximal duodenum. Vascular/Lymphatic: Small para-aortic lymph nodes. Atherosclerotic vascular calcifications are noted. No evidence of aortic aneurysm. Other: No free-fluid. Musculoskeletal: No vertebral compression deformity. IMPRESSION: The stomach is positioned deep to a prominent left lobe of the liver as well as the transverse colon. Gas is distension of the stomach and barium opacification of the transverse colon will be essential during the procedure. Bibasilar pulmonary consolidation. Large pericardial effusion. Electronically Signed   By: Marybelle Killings M.D.   On: 03/02/2017 07:18   Dg Chest 2 View  Result Date: 02/14/2017 CLINICAL DATA:  Hypoxia. Pt came to ED with left foot pain post amputation of toes. Hx diabetes, PNA, COPD, HTN, MI, CAD, CKD, CHF. EXAM: CHEST  2 VIEW COMPARISON:  04/22/2016 FINDINGS: Moderate enlargement of the cardiopericardial silhouette, stable. No mediastinal or hilar masses. No evidence of adenopathy. Clear lungs.  No pleural effusion.  No pneumothorax. Skeletal structures are unremarkable. IMPRESSION: 1. No acute cardiopulmonary disease. 2. Stable moderate cardiomegaly. Electronically Signed   By: Lajean Manes M.D.   On: 02/14/2017 09:26   Ct Head Wo Contrast  Result Date: 02/14/2017 CLINICAL DATA:  Cardiac  arrest and, EXAM: CT HEAD WITHOUT CONTRAST TECHNIQUE: Contiguous axial images were obtained from the base of the skull through the vertex without intravenous contrast. COMPARISON:  None. FINDINGS: Brain: No mass lesion, intraparenchymal hemorrhage or extra-axial collection. No evidence of acute cortical infarct. Brain parenchyma and CSF-containing spaces are normal for age. Mildly asymmetric hypoattenuation of the right temporal lobe relative to the left is likely artifactual Vascular: No hyperdense vessel or unexpected calcification. Skull: Normal visualized skull base, calvarium and extracranial soft tissues. Sinuses/Orbits: No sinus fluid levels or advanced mucosal thickening. No mastoid effusion. Normal orbits. IMPRESSION: Normal head CT. Electronically Signed   By: Ulyses Jarred M.D.   On: 02/14/2017 22:23   Mr Brain Wo Contrast  Result Date: 02/18/2017 CLINICAL DATA:  Anoxic brain injury status post cardiac arrest EXAM: MRI HEAD WITHOUT CONTRAST TECHNIQUE: Multiplanar, multiecho pulse sequences of the brain and surrounding structures were obtained without intravenous contrast. IV contrast could not be administered due to the patient's poor renal function. COMPARISON:  Head CT 02/14/2017 FINDINGS: Brain: The midline structures are normal. There is no focal diffusion restriction to indicate acute infarct. The brain parenchymal signal is normal and there is no mass lesion. There is a focal susceptibility abnormality at the left insula (series 12, image 55). Brain volume is normal for age without age-advanced or lobar predominant atrophy. The dura is normal and there is no extra-axial collection. Vascular: Major intracranial arterial and venous sinus flow voids are preserved. Skull and upper cervical spine: The visualized skull base, calvarium, upper cervical spine and extracranial soft tissues are normal. Sinuses/Orbits: No fluid levels or advanced mucosal thickening. No mastoid effusion. Normal orbits.  IMPRESSION: 1. Focus of susceptibility in the left insula may indicate a thrombosed cortical vein. The location does not correspond to a large artery. 2. No associated ischemia or hemorrhage.  Otherwise normal brain. Electronically Signed   By: Ulyses Jarred M.D.   On: 02/18/2017 14:55   Ir Fluoro Guide Cv Line Right  Result Date: 03/11/2017 INDICATION: Osteomyelitis EXAM: TUNNELED RIGHT JUGULAR PICC LINE PLACEMENT WITH ULTRASOUND AND FLUOROSCOPIC GUIDANCE MEDICATIONS: None. ANESTHESIA/SEDATION: None FLUOROSCOPY TIME:  Fluoroscopy Time:  minutes 30 seconds (1 mGy). COMPLICATIONS: None immediate. PROCEDURE: The patient was advised of the possible risks and complications and agreed to undergo the procedure. The patient was then brought to the angiographic suite for the procedure. The right neck was prepped with chlorhexidine, draped in the usual sterile fashion using maximum barrier technique (cap and mask, sterile gown, sterile gloves, large sterile sheet, hand hygiene and cutaneous antiseptic). Local anesthesia was attained by infiltration with 1% lidocaine. Ultrasound demonstrated patency of the right jugular vein, and this was documented with an image. Under real-time ultrasound guidance, this vein was accessed with a 21 gauge micropuncture needle and image documentation was performed. A long subcutaneous tract was employed. The needle was exchanged over a guidewire for a peel-away sheath through which a 26 cm 5 [place] double lumen power injectable PICC was advanced, and positioned with its tip at the lower SVC/right atrial junction. The cuff was positioned in the subcutaneous tract. Fluoroscopy during the procedure and fluoro spot radiograph confirms appropriate catheter position. The catheter was flushed, secured to the skin with Prolene sutures, and covered with a sterile dressing. IMPRESSION: Successful placement of a tunneled right jugular PICC with sonographic and fluoroscopic guidance. The catheter is  ready for use. Electronically Signed   By: Marybelle Killings M.D.   On: 03/11/2017 15:31   Ir  US Guide Vasc Access Right  Result Date: 03/11/2017 INDICATION: Osteomyelitis EXAM: TUNNELED RIGHT JUGULAR PICC LINE PLACEMENT WITH ULTRASOUND AND FLUOROSCOPIC GUIDANCE MEDICATIONS: None. ANESTHESIA/SEDATION: None FLUOROSCOPY TIME:  Fluoroscopy Time:  minutes 30 seconds (1 mGy). COMPLICATIONS: None immediate. PROCEDURE: The patient was advised of the possible risks and complications and agreed to undergo the procedure. The patient was then brought to the angiographic suite for the procedure. The right neck was prepped with chlorhexidine, draped in the usual sterile fashion using maximum barrier technique (cap and mask, sterile gown, sterile gloves, large sterile sheet, hand hygiene and cutaneous antiseptic). Local anesthesia was attained by infiltration with 1% lidocaine. Ultrasound demonstrated patency of the right jugular vein, and this was documented with an image. Under real-time ultrasound guidance, this vein was accessed with a 21 gauge micropuncture needle and image documentation was performed. A long subcutaneous tract was employed. The needle was exchanged over a guidewire for a peel-away sheath through which a 26 cm 5 [place] double lumen power injectable PICC was advanced, and positioned with its tip at the lower SVC/right atrial junction. The cuff was positioned in the subcutaneous tract. Fluoroscopy during the procedure and fluoro spot radiograph confirms appropriate catheter position. The catheter was flushed, secured to the skin with Prolene sutures, and covered with a sterile dressing. IMPRESSION: Successful placement of a tunneled right jugular PICC with sonographic and fluoroscopic guidance. The catheter is ready for use. Electronically Signed   By: Marybelle Killings M.D.   On: 03/11/2017 15:31   Dg Chest Port 1 View  Result Date: 03/11/2017 CLINICAL DATA:  Leukocytosis. EXAM: PORTABLE CHEST 1 VIEW  COMPARISON:  03/03/2017 FINDINGS: Patient is rotated to the left. The cardio pericardial silhouette is enlarged. Slight improvement in left base aeration. Tracheostomy tube remains in place. A feeding tube passes into the stomach although the distal tip position is not included on the film. The visualized bony structures of the thorax are intact. Telemetry leads overlie the chest. IMPRESSION: Persistent enlargement of the cardiopericardial silhouette without evidence for overt pulmonary edema or substantial pleural effusion. Electronically Signed   By: Misty Stanley M.D.   On: 03/11/2017 08:37   Dg Chest Port 1 View  Result Date: 03/03/2017 CLINICAL DATA:  Respiratory failure. EXAM: PORTABLE CHEST 1 VIEW COMPARISON:  03/01/2017 . FINDINGS: Tracheostomy tube and feeding tube in stable position. Cardiomegaly with mild diffuse interstitial prominence suggesting mild CHF. Persistent atelectasis and consolidation left lower lobe. Small left pleural effusion cannot be excluded. No pneumothorax. IMPRESSION: 1. Tracheostomy tube and feeding tube in stable position. 2. Cardiomegaly with diffuse mild interstitial prominence suggesting mild CHF. 3. Persistent left lower lobe atelectasis and consolidation. Small left pleural effusion cannot be excluded . Electronically Signed   By: Marcello Moores  Register   On: 03/03/2017 07:09   Dg Chest Port 1 View  Result Date: 03/01/2017 CLINICAL DATA:  Respiratory failure EXAM: PORTABLE CHEST 1 VIEW COMPARISON:  02/27/2017 FINDINGS: 0546 hours. Patient rotated to the left. Tracheostomy tube remains in place. A feeding tube passes into the stomach although the distal tip position is not included on the film. The cardio pericardial silhouette is enlarged. Left base collapse/consolidation again noted. There is pulmonary vascular congestion without overt pulmonary edema. IMPRESSION: Rotated film with cardiomegaly and vascular congestion. Persistent left base collapse/ consolidation.  Electronically Signed   By: Misty Stanley M.D.   On: 03/01/2017 07:43   Dg Chest Port 1 View  Result Date: 02/27/2017 CLINICAL DATA:  Hypoxia. EXAM: PORTABLE CHEST 1 VIEW  COMPARISON:  02/26/2017. FINDINGS: Tracheostomy to in stable position. Stable cardiomegaly. Low lung volumes. Progressive left lower lobe atelectasis and infiltrate. Small left pleural effusion cannot be excluded . IMPRESSION: 1. Tracheostomy tube in stable position. 2. Progressive left lower lobe atelectasis and infiltrate. Small left pleural effusion cannot be excluded . Electronically Signed   By: Marcello Moores  Register   On: 02/27/2017 07:01   Dg Chest Port 1 View  Result Date: 02/26/2017 CLINICAL DATA:  Status post tracheostomy placement. EXAM: PORTABLE CHEST 1 VIEW COMPARISON:  Earlier today. FINDINGS: The endotracheal tube has been removed and replaced with a tracheostomy tube in satisfactory position. The nasogastric tube and esophageal probe have been removed. The left jugular catheter has been removed. No pneumothorax. Grossly stable enlarged cardiac silhouette. Left lower lobe opacity with improvement laterally since 02/25/2017. Clear right lung. Unremarkable bones. IMPRESSION: 1. Tracheostomy tube in satisfactory position. 2. Left lower lobe atelectasis or pneumonia with mild improvement since 2 days ago. 3. Stable cardiomegaly. Electronically Signed   By: Claudie Revering M.D.   On: 02/26/2017 16:02   Dg Chest Port 1 View  Result Date: 02/26/2017 CLINICAL DATA:  Hypoxia.  Shortness of breath. EXAM: PORTABLE CHEST 1 VIEW COMPARISON:  02/25/2017. FINDINGS: Endotracheal tube, NG tube, left IJ line esophageal probe in stable position. Cardiomegaly with bilateral pulmonary interstitial infiltrates consistent with CHF. Left base atelectasis . Small left pleural effusion. Similar findings noted on prior exam. No pneumothorax. IMPRESSION: 1. Lines and tubes in stable position. 2. Cardiomegaly with bilateral pulmonary interstitial prominence  and small left pleural effusion noted consistent CHF. Left base atelectasis. Similar findings noted on prior exam. Electronically Signed   By: [place]   On: 02/26/2017 07:18   Dg Chest Port 1 View  Result Date: 02/25/2017 CLINICAL DATA:  Respiratory failure, intubated patient. Diabetes, cardiomyopathy, morbid obesity, end-stage renal disease. EXAM: PORTABLE CHEST 1 VIEW COMPARISON:  Portable chest x-ray of Feb 24, 2017 FINDINGS: The lungs are adequately inflated. The pulmonary interstitial markings are slightly less prominent today. The pulmonary vascularity remains engorged. The cardiac silhouette remains enlarged. Retrocardiac region on the left is slightly less dense. The endotracheal tube tip lies 5.7 cm above the carina. The esophagogastric tube tip projects below the inferior margin of the image. The left internal jugular venous catheter tip projects over the proximal SVC. IMPRESSION: Slight interval improvement in pulmonary interstitial edema. Stable cardiomegaly. Persistent left lower lobe atelectasis or pneumonia. The support tubes are in reasonable position. Electronically Signed   By: David  Martinique M.D.   On: 02/25/2017 07:57   Dg Chest Port 1 View  Result Date: 02/24/2017 CLINICAL DATA:  Respiratory failure EXAM: PORTABLE CHEST 1 VIEW COMPARISON:  Two days ago FINDINGS: Endotracheal tube tip at the clavicular heads. An orogastric tube reaches the stomach. Esophageal thermistor. Left IJ central line with tip at the SVC level. Cardiomegaly and diffuse hazy opacity with cephalized blood flow. No definitive effusion. No pneumothorax. IMPRESSION: 1. Stable positioning of tubes and central line. 2. CHF pattern. Electronically Signed   By: Monte Fantasia M.D.   On: 02/24/2017 07:31   Dg Chest Port 1 View  Result Date: 02/22/2017 CLINICAL DATA:  Respiratory failure EXAM: PORTABLE CHEST 1 VIEW COMPARISON:  02/21/2017 FINDINGS: Endotracheal tube terminates 5 cm above the carina. Cardiomegaly  with mild interstitial edema. Retrocardiac opacity, possibly reflecting a combination of atelectasis and pleural effusion, pneumonia not excluded. No pneumothorax. Left IJ venous catheter terminates at the cavoatrial junction. Enteric tube courses into the  stomach. IMPRESSION: Endotracheal tube terminates 5 cm above the carina. Cardiomegaly with mild interstitial edema. Retrocardiac opacity, possibly atelectasis and pleural effusion, pneumonia not excluded. Electronically Signed   By: Julian Hy M.D.   On: 02/22/2017 07:30   Dg Chest Port 1 View  Result Date: 02/21/2017 CLINICAL DATA:  Endotracheal tube. EXAM: PORTABLE CHEST 1 VIEW COMPARISON:  02/19/2017 FINDINGS: Endotracheal tube terminates approximately 5 cm above the carina. Enteric tube courses into the left upper abdomen with tip not imaged. Left jugular catheter terminates over the mid SVC. The cardiac silhouette remains enlarged. Pulmonary vascular congestion and bilateral parenchymal lung opacities have not significantly changed. No large pleural effusion or pneumothorax is identified. IMPRESSION: Unchanged pulmonary edema. Electronically Signed   By: Logan Bores M.D.   On: 02/21/2017 07:42   Dg Chest Port 1 View  Result Date: 02/19/2017 CLINICAL DATA:  36 year old female with respiratory failure. EXAM: PORTABLE CHEST 1 VIEW COMPARISON:  02/18/2017 and prior radiograph FINDINGS: An endotracheal tube with tip 3.5 cm above the carina, left IJ central venous catheter with tip overlying the lower SVC, and NG tube entering the stomach with tip off the field of view again noted. Pulmonary edema has decreased from the prior study. Continued left lower lung atelectasis/ consolidation again noted. There is no evidence of pneumothorax. No other changes are noted. IMPRESSION: Decreased pulmonary edema, otherwise unchanged appearance of the chest. Electronically Signed   By: Margarette Canada M.D.   On: 02/19/2017 08:03   Dg Chest Port 1 View  Result  Date: 02/18/2017 CLINICAL DATA:  Respiratory failure EXAM: PORTABLE CHEST 1 VIEW COMPARISON:  02/17/2017 FINDINGS: Cardiac shadow remains enlarged. Endotracheal tube, nasogastric catheter and esophageal probe are again seen and stable. Left jugular central line is again noted and stable. No pneumothorax is seen. Diffuse vascular congestion and increased parenchymal opacity is noted consistent with progressive CHF. No focal confluent infiltrate is noted. IMPRESSION: Progressive changes of CHF. Electronically Signed   By: Inez Catalina M.D.   On: 02/18/2017 08:15   Dg Chest Port 1 View  Result Date: 02/17/2017 CLINICAL DATA:  Pulmonary edema EXAM: PORTABLE CHEST 1 VIEW COMPARISON:  02/16/2017 FINDINGS: Endotracheal tube, NG tube, left jugular central venous catheter are stable. Severe cardiomegaly is stable. Mild vascular congestion is stable. No consolidation. No interstitial edema. IMPRESSION: Stable cardiomegaly and mild vascular congestion. Electronically Signed   By: Marybelle Killings M.D.   On: 02/17/2017 07:35   Dg Chest Port 1 View  Result Date: 02/16/2017 CLINICAL DATA:  Acute respiratory failure. EXAM: PORTABLE CHEST 1 VIEW COMPARISON:  Radiograph of Feb 14, 2017. FINDINGS: Stable cardiomegaly. Endotracheal and nasogastric tubes are unchanged in position. Left internal jugular catheter is unchanged with distal tip in expected position of SVC. No pneumothorax is noted. Lungs are clear. Bony thorax is unremarkable. IMPRESSION: Stable support apparatus.  Stable cardiomegaly. Electronically Signed   By: Marijo Conception, M.D.   On: 02/16/2017 07:30   Dg Chest Port 1 View  Result Date: 02/14/2017 CLINICAL DATA:  Central line placement EXAM: PORTABLE CHEST 1 VIEW COMPARISON:  02/14/2017 at 0913 hours FINDINGS: Endotracheal tube terminates 4.5 cm above the carina. Lungs are clear.  No pleural effusion or pneumothorax. Cardiomegaly. Left IJ venous catheter terminates at cavoatrial junction. Enteric tube courses  into the stomach. Defibrillator pads overlying the left hemithorax. IMPRESSION: Endotracheal tube terminates 4.5 cm above the carina. Left IJ venous catheter terminates at the cavoatrial junction. Electronically Signed   By: Henderson Newcomer.D.  On: 02/14/2017 18:31   Dg Abd Portable 1v  Result Date: 03/02/2017 CLINICAL DATA:  Encounter for feeding tube placement EXAM: PORTABLE ABDOMEN - 1 VIEW COMPARISON:  Portable exam 1712 hours compared to CT abdomen and pelvis of 03/01/2017 FINDINGS: Feeding tube traverses abdomen with tip projecting over distal antrum near pylorus. Cardiac silhouette appears enlarged. Visualized bowel gas pattern normal. Osseous structures unremarkable. IMPRESSION: Tip of feeding tube projects over distal gastric antrum near pylorus. Electronically Signed   By: Lavonia Dana M.D.   On: 03/02/2017 17:21   Dg Abd Portable 1v  Result Date: 02/27/2017 CLINICAL DATA:  Feeding tube placement EXAM: PORTABLE ABDOMEN - 1 VIEW COMPARISON:  None. FINDINGS: Feeding tube with the tip projecting over the antrum of the stomach. There is no bowel dilatation to suggest obstruction. There is no evidence of pneumoperitoneum, portal venous gas or pneumatosis. There are no pathologic calcifications along the expected course of the ureters. The osseous structures are unremarkable. IMPRESSION: Feeding tube with the tip projecting over the antrum of the stomach. Electronically Signed   By: Kathreen Devoid   On: 02/27/2017 11:05   Dg Foot Complete Left  Result Date: 02/14/2017 CLINICAL DATA:  Pt c/o severe pain and inflammation in left foot. 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 Weeping evident at site of amputation. Pt reports nausea today. Hx diabetes, HTN, CKD. EXAM: LEFT FOOT - COMPLETE 3+ VIEW COMPARISON:  12/29/2016 FINDINGS: Postop changes including resection of the proximal first and fourth metatarsals since prior exam. Small proximal fragments of the second third and fifth  metatarsals are identified. There is some indistinctness of the resection margin of the third and fifth metatarsal fragments suggesting osteomyelitis. There is a soft tissue defect distal to the resection margin seen best on the lateral projection. IMPRESSION: 1. Interval postop changes. Indistinctness of residual third and fifth metatarsal fragments, suggesting osteomyelitis. Electronically Signed   By: Lucrezia Europe M.D.   On: 02/14/2017 08:15   US Abdomen Limited Ruq  Result Date: 02/18/2017 CLINICAL DATA:  Elevated liver function studies. EXAM: US ABDOMEN LIMITED - RIGHT UPPER QUADRANT COMPARISON:  None. FINDINGS: Gallbladder: No gallstones or wall thickening visualized. No sonographic Murphy sign noted by sonographer. Common bile duct: Diameter: 7 mm, normal Liver: Circumscribed hyperechoic lesion with central hypoechoic appearance demonstrated along the dome of the liver measuring 2.8 cm maximal diameter. This is likely to represent a cavernous hemangioma, but the appearance on ultrasound is somewhat atypical. Consider follow-up with elective MRI of the liver for better characterization. IMPRESSION: No evidence of cholelithiasis or cholecystitis. Circumscribed hyperechoic lesion in the dome of the liver measuring 2.8 cm maximal diameter. This is probably a cavernous hemangioma but appearance is somewhat atypical. Suggest elective MRI liver in follow-up for better characterization. Electronically Signed   By: Lucienne Capers M.D.   On: 02/18/2017 02:10     LOS: 27 days   Oren Binet, MD  [facility] Pager:[phone]  If 7PM-7AM, please contact night-coverage [email or web address] Password [facility] 03/13/2017, 7:52 AM

## 2017-03-13 NOTE — Progress Notes (Signed)
  Speech Language Pathology Treatment: [name] Speaking valve  Patient Details Name: [name] MRN: [id number] DOB: [birth year] Today's Date: 03/13/2017 Time: 0910-0929 SLP Time Calculation (min) (ACUTE ONLY): 19 min  Assessment / Plan / Recommendation Clinical Impression  Patient on trach collar as of this am, alert, restless, mouthing words appropriately in response to basic biographical questions. Cuff deflated at baseline and patient tolerating well without evidence of distress and stable vital signs. Valve placed for 5-10 second intervals however patient consistently with immediate increase in use of accessory muscles, no evidence of ability to move air through upper airway, and loud burst of air from trach hub with valve removal indicative of air trapping. Requested deep suctioning from RN and re-attempted valve placement without change in performance. Educated mom regarding probably cause, large trach size. Will continue efforts however suspect that trach downsize will be necessary before patient can effectively utilize PMV.    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 ETT 5/19 and trach'd 5/31. Hospital course included cardiac arrest, transfer to ICU      SLP Plan  Continue with current plan of care       Recommendations         Patient may use Passy-Muir Speech Valve: with SLP only PMSV Supervision: Full MD: Please consider changing trach tube to : Cuffless;Smaller size (when fully weaned from vent)         Oral Care Recommendations: Oral care QID Follow up Recommendations: Skilled Nursing facility SLP Visit Diagnosis: Aphonia (R49.1) Plan: Continue with current plan of  care       GO             [name] MA, CCC-SLP [phone]    Kierre Hintz Meryl 03/13/2017, 10:18 AM

## 2017-03-13 NOTE — Progress Notes (Signed)
PULMONARY / CRITICAL CARE MEDICINE   Name: [name] MRN: [id number] DOB: [birth year]    ADMISSION DATE:  02/14/2017 CONSULTATION DATE:  02/14/2017  REFERRING MD:  Feliz Beam MD  CHIEF COMPLAINT:  Sepsis, cardiac arrest  BRIEF SUMMARY:   36 year old female w/ ESRD d/t poorly controlled DM, bilateral transmetatarsal foot amputations, obesity, HTN, medical non-compliance as well as NICM. Admitted 5/19 with complaints of left leg pain, found to have radiological evidence of osteomyelitis with wound nonhealing, dehiscence, purulent drainage from the wound. Admitted for IV antibiotics, hemodialysis and possible surgery.  Found unresponsive in the room on 5/19. She had chest compressions, epi bicarb given. She was intubated by anesthesia and had ROSC in approximately 9 minutes. Downtime prior to code is unknown. She got dilaudid in the afternoon for leg pain.  SUBJECTIVE:  No follows  commands  VITAL SIGNS: BP 108/75   Pulse (!) 120   Temp (!) 100.7 F (38.2 C) (Axillary)   Resp 13   Ht 5' 8.5" (1.74 m)   Wt 195 lb (88.5 kg)   LMP  (LMP Unknown) Comment: Patient trached  SpO2 97%   BMI 29.22 kg/m   VENTILATOR SETTINGS: Vent Mode: PSV;CPAP FiO2 (%):  [30 %-40 %] 30 % Set Rate:  [16 bmp] 16 bmp Vt Set:  [530 mL] 530 mL PEEP:  [5 cmH20] 5 cmH20 Pressure Support:  [15 cmH20-18 cmH20] 18 cmH20 Plateau Pressure:  [19 cmH20-25 cmH20] 19 cmH20  INTAKE / OUTPUT: I/O last 3 completed shifts: In: 1772.3 [I.V.:1321; NG/GT:451.3] Out: 50 [Stool:200]  General:  Frail, AAF , unable to follow commands HEENT: Trach CDI, sutures out ESP:QZRAQT injury pattern Neuro: MAE spont, whips head side to side CV: HSR ST 118 PULM: Mild rhonchi bilat MA:UQJF, non-tender, bsx4 active  Extremities: Left leg with gangrene  Skin: Warm and dry    LABS:  BMET  Recent Labs Lab 03/10/17 0722 03/11/17 0842 03/13/17 0557  NA 142 140 136  K 3.2* 4.0 4.1  CL 101 98* 96*  CO2 23 27 26   BUN 131*  74* 45*  CREATININE 6.92* 4.65* 3.78*  GLUCOSE 173* 175* 172*    Electrolytes  Recent Labs Lab 03/10/17 0722 03/11/17 0842 03/13/17 0557  CALCIUM 9.6 9.3 9.0  PHOS 4.4 3.4 3.0    CBC  Recent Labs Lab 03/11/17 0842 03/12/17 0702 03/13/17 0557  WBC 28.5* 24.0* 23.4*  HGB 9.1* 8.9* 8.1*  HCT 30.5* 29.5* 27.2*  PLT 377 380 363    No results for input(s): PHART, PCO2ART, PO2ART in the last 168 hours.  Liver Enzymes  Recent Labs Lab 03/10/17 0722 03/11/17 0842 03/13/17 0557  ALBUMIN 2.2* 2.5* 2.4*   Glucose  Recent Labs Lab 03/12/17 0823 03/12/17 1251 03/12/17 1543 03/12/17 2016 03/12/17 2351 03/13/17 0432  GLUCAP 212* 146* 156* 162* 175* 161*   STUDIES:  Lower extremity ultrasound 5/18 >> no evidence of DVT CT head 5/19 >> normal exam Echo 5/20 >> EF 30-35%, increased LVF. Diffuse hypokinesis, akinesis of basilar mid-inferior myocardium, grade 2 diastolic dysfxn  EEG 5/20 >> finding c/w mod to severe global cerebral dysfxn. C/w anoxic injury given clinical course  LE [place] 5/21 >> negative MRI brain 5/24 >> ?thrombosed cortical vein, otherwise normal TEE 6/4 >> mild MR, normal AV, mild TR, mild PR, EF 30-35%, diffuse hypokinesis, no thrombus, no PFO, normal RV, moderate pericardial effusion with synechia suggesting some chronicity, no vegetations   CULTURES: Bcx 5/19 X 2 >> negative Blood cultures 6/1 >>  negative c diff 6/1 >> negative  ANTIBIOTICS: Clinidamycin 5/19 >> 5/21 Vancomycin 5/19 >> 5/25 >> restart 5/30 Zosyn 5/19 >> 5/22 Meropenem 5/22 >> 5/30 Flagyl 5/30 >>off Ceftriaxone 5/30 >>off fortaz 6/14>> Vanco 6/14>>  SIGNIFICANT EVENTS: 5/19  Admit, cardiac arrest, transfer to ICU; hypothermia protocol started 5/24  Off pressors.  MRI w/out evidence of ischemia  6/03  Tolerated ATC couple hours, then back to vent, HD  6/15 left BKA planned  LINES/TUBES: ETT 5/19 >> 5/31 Lt IJ CVL 5/19 >> out Trach 5/31 >>   ASSESSMENT /  PLAN:  Discussion:  36 y/o F with PMH of poorly controlled DM, ESRD on HD and known gangrene of L achilles heel, admitted with non-healing wounds.  Admitted per [facility].  Received dilaudid for pain and subsequently suffered a cardiac arrest. Tx to ICU.   Pulmonary: Acute respiratory failure - in setting of cardiac arrest, chronic critical illness, requiring ventilator for support, making progress with weaning. Currently on PS 15 with Vt 451,rr 22. Requires full vent nocturnally and intermittently during the day. P: PRVC as rest mode  PSV to ATC as tolerated  Trach care per protocol  Push PT, nutrition efforts as tolerated Intermittent CXR Primary working on source control for foot > second opinion obtained, remains a full code 6/12 For BKA 6/15 which may help with weaning.   All other medical issues below per [facility], appreciate assistance.  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 Per ID recommend total 10 days of antibiotics.  Continue ceftriaxone, flagyl plus vancomycin. 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 On hold for planned BKA 6/15 Goals of care - full code  Global:  [facility] for primary medical care, appreciate assistance. PCCM will continue to follow for trach care.  Will follow up 6/118 or 19 unless new needs arise.   Brett Canales Vonzell Lindblad ACNP [facility] PCCM Pager [phone] till 3 pm If no answer page [phone] 03/13/2017, 8:18 AM

## 2017-03-13 NOTE — Progress Notes (Signed)
[facility] Progress Note   Subjective: for BKA L this afternoon  Objective Vitals:   03/13/17 0819 03/13/17 0827 03/13/17 0927 03/13/17 1140  BP: 108/75  122/77 117/86  Pulse:  (!) 118 (!) 122 (!) 119  Resp:  16  16  Temp: 97.7 F (36.5 C)     TempSrc: Oral     SpO2:  99%  98%  Weight:      Height:       Physical Exam General: chronically ill appearing female in NAD, responded to simple command  Heart: V6,P2, 2/^ systolic M. No JVD. HR 120s Lungs: trach in place, off vent on trach collar, coarse bilat upper airway noises Abdomen: active BS Feeding tube present. Extremities: Bilateral heel protectors. No LE edema.  Dialysis Access: LUA AVF + bruit   Dialysis: [facility]   4h 32mn  F200   114kg  2/2 bath  LUA AVF  Hep none - Iron Sucrose (Venofer) 50 mg IVP During Dialysis 1X Week  - Mircera 75 mcg IV q 2 weeks ( last on 02/05/17) - Vitamin D (Calcitriol) Oral0.75 mcg Po q hd    Assess: 1. 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 VDRF: trach- dependent, off vent now 3. 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 ESRD - HD TTS, 4h 171m 5. Anemia - HGB 8.4. Cont ESA. No Fe D/T osteo.  6. Secondary hyperparathyroidism - Ca 9.2 Phos 3.9, s/p Ca++ supp, now on 250 tid CaCO3  7. HTN/volume - BP's ok, cont coreg at 12.5 bid (25 bid at home), holding home isosorbide 8. Nutrition: Albumin 2.2 On tube feeding prostat/nepro  9. DM: per primary 10. NICM H/O diastolic/systolic HF  Plan - HD tomorrow   [facility] SplinterD [facility] pager [phone] 03/10/2017, 12:55 PM    Additional Objective Labs: Basic Metabolic Panel:  Recent Labs Lab 03/10/17 0722 03/11/17 0842 03/13/17 0557  NA 142 140 136  K 3.2* 4.0 4.1  CL 101 98* 96*  CO2 _0 GLUCOSE 173* 175* 172*  BUN 131* 74* 45*  CREATININE 6.92* 4.65* 3.78*  CALCIUM 9.6 9.3 9.0  PHOS 4.4 3.4 3.0   Liver Function Tests:  Recent Labs Lab 03/10/17 0722 03/11/17 0842  03/13/17 0557  ALBUMIN 2.2* 2.5* 2.4*   No results for input(s): LIPASE, AMYLASE in the last 168 hours. CBC:  Recent Labs Lab 03/07/17 0725 03/10/17 0721 03/11/17 0842 03/12/17 0702 03/13/17 0557  WBC 24.4* 33.8* 28.5* 24.0* 23.4*  NEUTROABS  --   --  23.0* 20.2*  --   HGB 8.4* 8.9* 9.1* 8.9* 8.1*  HCT 28.5* 29.9* 30.5* 29.5* 27.2*  MCV 97.3 96.8 97.4 96.7 97.5  PLT 559* 460* 377 380 363   Blood Culture    Component Value Date/Time   SDES BLOOD RIGHT HAND 03/11/2017 0846   SPECREQUEST  03/11/2017 0846    BOTTLES DRAWN AEROBIC AND ANAEROBIC Blood Culture adequate volume   CULT NO GROWTH 2 DAYS 03/11/2017 0846   REPTSTATUS PENDING 03/11/2017 0846    Cardiac Enzymes: No results for input(s): CKTOTAL, CKMB, CKMBINDEX, TROPONINI in the last 168 hours. CBG:  Recent Labs Lab 03/12/17 1251 03/12/17 1543 03/12/17 2016 03/12/17 2351 03/13/17 0432  GLUCAP 146* 156* 162* 175* 161*   Iron Studies: No results for input(s): IRON, TIBC, TRANSFERRIN, FERRITIN in the last 72 hours. _1 @ Studies/Results: Ir Fluoro Guide Cv Line Right  Result Date: 03/11/2017 INDICATION: Osteomyelitis EXAM: TUNNELED RIGHT JUGULAR  PICC LINE PLACEMENT WITH ULTRASOUND AND FLUOROSCOPIC GUIDANCE MEDICATIONS: None. ANESTHESIA/SEDATION: None FLUOROSCOPY TIME:  Fluoroscopy Time:  minutes 30 seconds (1 mGy). COMPLICATIONS: None immediate. PROCEDURE: The patient was advised of the possible risks and complications and agreed to undergo the procedure. The patient was then brought to the angiographic suite for the procedure. The right neck was prepped with chlorhexidine, draped in the usual sterile fashion using maximum barrier technique (cap and mask, sterile gown, sterile gloves, large sterile sheet, hand hygiene and cutaneous antiseptic). Local anesthesia was attained by infiltration with 1% lidocaine. Ultrasound demonstrated patency of the right jugular vein, and this was documented with an image. Under  real-time ultrasound guidance, this vein was accessed with a 21 gauge micropuncture needle and image documentation was performed. A long subcutaneous tract was employed. The needle was exchanged over a guidewire for a peel-away sheath through which a 26 cm 5 [place] double lumen power injectable PICC was advanced, and positioned with its tip at the lower SVC/right atrial junction. The cuff was positioned in the subcutaneous tract. Fluoroscopy during the procedure and fluoro spot radiograph confirms appropriate catheter position. The catheter was flushed, secured to the skin with Prolene sutures, and covered with a sterile dressing. IMPRESSION: Successful placement of a tunneled right jugular PICC with sonographic and fluoroscopic guidance. The catheter is ready for use. Electronically Signed   By: Jolaine Click M.D.   On: 03/11/2017 15:31   Ir US Guide Vasc Access Right  Result Date: 03/11/2017 INDICATION: Osteomyelitis EXAM: TUNNELED RIGHT JUGULAR PICC LINE PLACEMENT WITH ULTRASOUND AND FLUOROSCOPIC GUIDANCE MEDICATIONS: None. ANESTHESIA/SEDATION: None FLUOROSCOPY TIME:  Fluoroscopy Time:  minutes 30 seconds (1 mGy). COMPLICATIONS: None immediate. PROCEDURE: The patient was advised of the possible risks and complications and agreed to undergo the procedure. The patient was then brought to the angiographic suite for the procedure. The right neck was prepped with chlorhexidine, draped in the usual sterile fashion using maximum barrier technique (cap and mask, sterile gown, sterile gloves, large sterile sheet, hand hygiene and cutaneous antiseptic). Local anesthesia was attained by infiltration with 1% lidocaine. Ultrasound demonstrated patency of the right jugular vein, and this was documented with an image. Under real-time ultrasound guidance, this vein was accessed with a 21 gauge micropuncture needle and image documentation was performed. A long subcutaneous tract was employed. The needle was exchanged over a  guidewire for a peel-away sheath through which a 26 cm 5 [place] double lumen power injectable PICC was advanced, and positioned with its tip at the lower SVC/right atrial junction. The cuff was positioned in the subcutaneous tract. Fluoroscopy during the procedure and fluoro spot radiograph confirms appropriate catheter position. The catheter was flushed, secured to the skin with Prolene sutures, and covered with a sterile dressing. IMPRESSION: Successful placement of a tunneled right jugular PICC with sonographic and fluoroscopic guidance. The catheter is ready for use. Electronically Signed   By: Jolaine Click M.D.   On: 03/11/2017 15:31   Medications: . sodium chloride 10 mL/hr at 03/03/17 1700  . sodium chloride 20 mL/hr at 03/07/17 0413  . sodium chloride    . sodium chloride    . sodium chloride    .  ceFAZolin (ANCEF) IV    . cefTAZidime (FORTAZ)  IV    . [START ON 03/14/2017] vancomycin     . calcium carbonate (dosed in mg elemental calcium)  250 mg of elemental calcium Oral TID  . carvedilol  12.5 mg Oral BID WC  . chlorhexidine  60  mL Topical Once  . chlorhexidine gluconate (MEDLINE KIT)  15 mL Mouth Rinse BID  . darbepoetin (ARANESP) injection - DIALYSIS  200 mcg Intravenous Q Tue-HD  . feeding supplement (NEPRO CARB STEADY)  1,000 mL Per Tube Q24H  . feeding supplement (PRO-STAT SUGAR FREE 64)  60 mL Per Tube QID  . [name]'s butt cream   Topical QID  . heparin  5,000 Units Subcutaneous Q8H  . insulin aspart  0-15 Units Subcutaneous Q4H  . mouth rinse  15 mL Mouth Rinse QID  . multivitamin  1 tablet Oral QHS  . pantoprazole sodium  40 mg Per Tube Q24H  . sodium chloride flush  10-40 mL Intracatheter Q12H

## 2017-03-13 NOTE — Op Note (Addendum)
02/14/2017 - 03/13/2017  3:51 PM  PATIENT:  [name]    PRE-OPERATIVE DIAGNOSIS:  abscess left foot  POST-OPERATIVE DIAGNOSIS: Abscess extending to the knee  PROCEDURE:  Left above-the-knee amputation  SURGEON:  Nadara Mustard, MD  PHYSICIAN ASSISTANT:None ANESTHESIA:   General  PREOPERATIVE INDICATIONS:  [name] is a  36 y.o. female with a diagnosis of abscess left foot who failed conservative measures and elected for surgical management.    The risks benefits and alternatives were discussed with the patient preoperatively including but not limited to the risks of infection, bleeding, nerve injury, cardiopulmonary complications, the need for revision surgery, among others, and the patient was willing to proceed.  OPERATIVE IMPLANTS: Wound VAC  OPERATIVE FINDINGS: Abscess extending up to the knee  OPERATIVE PROCEDURE: Patient was brought to the operating room and underwent a general anesthetic. After adequate levels anesthesia were obtained patient's left lower extremity was prepped using DuraPrep draped into a sterile field a timeout was called. A transverse incision was made 11 cm distal to the tibial tubercle. There was a large purulent abscess that extended from the foot up into the leg. This is extended all the way up to the knee. At this time it was determined that there were no salvage options below the knee this was covered with [employer] and gloves were then used for a above-the-knee amputation. A fishmouth incision was made proximal to the patella. This was carried down to the retinaculum the vascular bundles medially were clamped with a hemostat and the femur was transected with a reciprocating saw. The leg was amputated the vascular bundles were suture ligated with 2-0 silk. Hemostasis was obtained. The deep and superficial fascial layers were closed using 2-0 nylon. Staples were also used. A Prevena wound VAC was applied this had a good suction fit. Patient was then  extubated and taken to the PACU in stable condition.

## 2017-03-13 NOTE — Progress Notes (Signed)
Tube Feeding stopped as patient is NPO for surgery.  Noe Gens, RN

## 2017-03-13 NOTE — Progress Notes (Signed)
Patient transported back to 10M from PACU without incident. Patient transported on 45% Venturi setup to [facility]. Placed on 40% ATC upon arrival back to 10M.

## 2017-03-13 NOTE — Progress Notes (Signed)
Patient placed on 40% ATC after brief trial of PSV on ventilator. No distress noted at this time. Vital signs stable, plan to continue as long as tolerated by patient.

## 2017-03-13 NOTE — Anesthesia Procedure Notes (Signed)
Performed by: Daiva Eves Pre-anesthesia Checklist: Emergency Drugs available, Suction available, Patient being monitored, Timeout performed and Patient identified Patient Re-evaluated:Patient Re-evaluated prior to inductionOxygen Delivery Method: Circle system utilized Intubation Type: Inhalational induction with existing ETT and Tracheostomy Placement Confirmation: positive ETCO2 Dental Injury: Teeth and Oropharynx as per pre-operative assessment

## 2017-03-13 NOTE — Anesthesia Preprocedure Evaluation (Addendum)
Anesthesia Evaluation  Patient identified by MRN, date of birth, ID band Patient awake    Reviewed: Allergy & Precautions, NPO status , Patient's Chart, lab work & pertinent test results  History of Anesthesia Complications (+) PONV and history of anesthetic complications  Airway Mallampati: Trach  TM Distance: >3 FB Neck ROM: Full    Dental  (+) Teeth Intact   Pulmonary asthma , COPD,    breath sounds clear to auscultation       Cardiovascular hypertension, Pt. on medications and Pt. on home beta blockers + CAD, + Past MI and +CHF   Rhythm:Regular Rate:Normal  ECG: SR, rate 96  ECHO: Left ventricle: Diffuse hypokinesis. Systolic function was moderately to severely reduced. The estimated ejection fraction was in the range of 30% to 35%. - Aortic valve: No evidence of vegetation. - Mitral valve: There was mild regurgitation. - Left atrium: No evidence of thrombus in the appendage. No evidence of thrombus in the atrial cavity or appendage. - Right atrium: No evidence of thrombus in the atrial cavity or appendage. - Atrial septum: No defect or patent foramen ovale was identified. - Tricuspid valve: No evidence of vegetation. - Pulmonic valve: No evidence of vegetation. - Pericardium, extracardiac: Moderate pericardial effusion no obvious RV collapse Synechea seen suggesting some chronicity.   Neuro/Psych  Headaches,    GI/Hepatic   Endo/Other  diabetes, Insulin Dependent  Renal/GU ESRF and DialysisRenal disease     Musculoskeletal   Abdominal   Peds  Hematology  (+) anemia ,   Anesthesia Other Findings Hyperlipidemia  Reproductive/Obstetrics                            Anesthesia Physical  Anesthesia Plan  ASA: IV  Anesthesia Plan: General   Post-op Pain Management:    Induction: Intravenous  PONV Risk Score and Plan: 4 or greater and Ondansetron, Dexamethasone, Propofol and  Treatment may vary due to age or medical condition  Airway Management Planned: Tracheostomy  Additional Equipment:   Intra-op Plan:   Post-operative Plan:   Informed Consent: I have reviewed the patients History and Physical, chart, labs and discussed the procedure including the risks, benefits and alternatives for the proposed anesthesia with the patient or authorized representative who has indicated his/her understanding and acceptance.   Consent reviewed with POA  Plan Discussed with: CRNA  Anesthesia Plan Comments:        Anesthesia Quick Evaluation

## 2017-03-13 NOTE — Progress Notes (Signed)
Patient ID: [name], female   DOB: 1981-07-10, 36 y.o.   MRN: [id number] I reviewed the patient's care with the mother this morning. Mother states that the patient wants to have the procedure performed by Dr. Victorino Dike. I discussed with the mother that I would be available for any questions or consultations they may have. Patient's mother was concerned that a doctor did not evaluate a bug bite the patient had that precipitated her condition.

## 2017-03-13 NOTE — Transfer of Care (Signed)
Immediate Anesthesia Transfer of Care Note  Patient: [name]  Procedure(s) Performed: Procedure(s): LEFT BELOW KNEE AMPUTATION (Left)  Patient Location: PACU  Anesthesia Type:General  Level of Consciousness: drowsy and Patient remains intubated per anesthesia plan  Airway & Oxygen Therapy: Patient Spontanous Breathing and Patient placed on Ventilator (see vital sign flow sheet for setting)  Post-op Assessment: Report given to RN and Post -op Vital signs reviewed and stable  Post vital signs: Reviewed and stable  Last Vitals:  Vitals:   03/13/17 1200 03/13/17 1238  BP: 129/83 (!) 117/99  Pulse: (!) 117   Resp: 20   Temp:  37 C    Last Pain:  Vitals:   03/13/17 1238  TempSrc: Oral  PainSc:          Complications: No apparent anesthesia complications

## 2017-03-14 ENCOUNTER — Encounter (HOSPITAL_COMMUNITY): Payer: Self-pay | Admitting: Orthopedic Surgery

## 2017-03-14 LAB — CBC WITH DIFFERENTIAL/PLATELET
BASOS PCT: 0 %
Basophils Absolute: 0 10*3/uL (ref 0.0–0.1)
EOS ABS: 0.4 10*3/uL (ref 0.0–0.7)
EOS PCT: 2 %
HCT: 25.7 % — ABNORMAL LOW (ref 36.0–46.0)
HEMOGLOBIN: 7.6 g/dL — AB (ref 12.0–15.0)
LYMPHS ABS: 1.3 10*3/uL (ref 0.7–4.0)
Lymphocytes Relative: 6 %
MCH: 28.6 pg (ref 26.0–34.0)
MCHC: 29.6 g/dL — AB (ref 30.0–36.0)
MCV: 96.6 fL (ref 78.0–100.0)
MONOS PCT: 6 %
Monocytes Absolute: 1.4 10*3/uL — ABNORMAL HIGH (ref 0.1–1.0)
NEUTROS PCT: 86 %
Neutro Abs: 19.6 10*3/uL — ABNORMAL HIGH (ref 1.7–7.7)
PLATELETS: 334 10*3/uL (ref 150–400)
RBC: 2.66 MIL/uL — ABNORMAL LOW (ref 3.87–5.11)
RDW: 17.8 % — AB (ref 11.5–15.5)
WBC: 22.8 10*3/uL — ABNORMAL HIGH (ref 4.0–10.5)

## 2017-03-14 LAB — GLUCOSE, CAPILLARY
GLUCOSE-CAPILLARY: 120 mg/dL — AB (ref 65–99)
GLUCOSE-CAPILLARY: 147 mg/dL — AB (ref 65–99)
Glucose-Capillary: 151 mg/dL — ABNORMAL HIGH (ref 65–99)
Glucose-Capillary: 125 mg/dL — ABNORMAL HIGH (ref 65–99)

## 2017-03-14 LAB — RENAL FUNCTION PANEL
ALBUMIN: 2.4 g/dL — AB (ref 3.5–5.0)
Anion gap: 16 — ABNORMAL HIGH (ref 5–15)
BUN: 63 mg/dL — AB (ref 6–20)
CALCIUM: 8.6 mg/dL — AB (ref 8.9–10.3)
CO2: 23 mmol/L (ref 22–32)
CREATININE: 5.26 mg/dL — AB (ref 0.44–1.00)
Chloride: 97 mmol/L — ABNORMAL LOW (ref 101–111)
GFR calc Af Amer: 11 mL/min — ABNORMAL LOW (ref >60)
GFR calc non Af Amer: 10 mL/min — ABNORMAL LOW (ref >60)
GLUCOSE: 146 mg/dL — AB (ref 65–99)
PHOSPHORUS: 5.1 mg/dL — AB (ref 2.5–4.6)
Potassium: 4.4 mmol/L (ref 3.5–5.1)
SODIUM: 136 mmol/L (ref 135–145)

## 2017-03-14 NOTE — Progress Notes (Signed)
   Subjective: 1 Day Post-Op Procedure(s) (LRB): AMPUTATION ABOVE KNEE (Left) Patient reports pain as trach , non verbal .    Objective: Vital signs in last 24 hours: Temp:  [97.6 F (36.4 C)-100.1 F (37.8 C)] 99.6 F (37.6 C) (06/16 0818) Pulse Rate:  [109-125] 119 (06/16 1030) Resp:  [16-33] 17 (06/16 0740) BP: (79-139)/(60-99) 111/83 (06/16 1030) SpO2:  [94 %-100 %] 100 % (06/16 0828) FiO2 (%):  [30 %-40 %] 30 % (06/16 0828) Weight:  [185 lb (83.9 kg)-189 lb (85.7 kg)] 185 lb (83.9 kg) (06/16 0740)  Intake/Output from previous day: 06/15 0701 - 06/16 0700 In: 850 [I.V.:600; IV Piggyback:250] Out: 75 [Blood:75] Intake/Output this shift: No intake/output data recorded.   Recent Labs  03/12/17 0702 03/13/17 0557 03/14/17 0449  HGB 8.9* 8.1* 7.6*    Recent Labs  03/13/17 0557 03/14/17 0449  WBC 23.4* 22.8*  RBC 2.79* 2.66*  HCT 27.2* 25.7*  PLT 363 334    Recent Labs  03/13/17 0557 03/14/17 0449  NA 136 136  K 4.1 4.4  CL 96* 97*  CO2 26 23  BUN 45* 63*  CREATININE 3.78* 5.26*  GLUCOSE 172* 146*  CALCIUM 9.0 8.6*   No results for input(s): LABPT, INR in the last 72 hours.  VAC on AKA with good seal and low leak rate.  No results found.  Assessment/Plan: 1 Day Post-Op Procedure(s) (LRB): AMPUTATION ABOVE KNEE (Left)   With VAC .   [name] 03/14/2017, 10:42 AM

## 2017-03-14 NOTE — Progress Notes (Signed)
Vent on standby pt on 30% ATC tolerating well.

## 2017-03-14 NOTE — Progress Notes (Signed)
PROGRESS NOTE        PATIENT DETAILS Name: [name] Age: 36 y.o. Sex: female Date of Birth: May 10, 1981 Admit Date: 02/14/2017 Admitting Physician Lady Deutscher, MD CHE:NIDPOEUM, Helene Kelp, FNP  Note-care assumed by me on 6/13  Brief Narrative: Patient is a 36 y.o. female with history of ESRD on HD, poorly controlled diabetes, history of bilateral transmetatarsal foot amputation with subsequent left foot wound dehiscence (refused BKA as outpatient), chronic systolic heart failure felt to be secondary to nonischemic cardiomyopathy admitted on 02/14/17 with wound dehiscence and persistent drainage from the left trans-metatarsal amputation site, she was admitted to the [facility]. She was subsequently found to be unresponsive in her room on 5/19 after getting IV Dilaudid-she was  in asystole, CPR was started, with ROSC in around 9 minutes.She was then transferred to the intensive care unit, and subsequently underwent cooling and then rewarming. She was unable to be liberated off the ventilator, and as a result underwent tracheostomy on 5/31. Patient has been evaluated by neurology during this hospital stay, per neurology chances of meaningful neurological recovery is dismal. Hospital course has now been complicated by persistent leukocytosis, respiratory failure, and wound dehiscence with gangrene causing persistent SIRS pathophysiology. 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 Able to whisper yes. Follows commands. Dialysis nurse at bedside.  Assessment/Plan: Acute hypoxemic respiratory failure in a setting of cardiac arrest: Continue attempts to wean/liberate off ventilator, [facility] M following-tracheostomy in place. If improvement continues, she is able to tolerate more weaning trial, maybe able to hold off on placing a PEG tube.  Cardiac arrest  on 5/19 with anoxic brain injury: She seems to be improving, following commands, able to whisper "yes" to me today. Note-Evaluated by neurology on 5/29-felt to have poor overall prognoses for any meaningful recovery.    Systemic inflammatory response syndrome: She is slowly improving-less tachycardia, leukocytosis is decreasing-this is likely secondary to left lower extremity infection. Blood cultures continue to be negative, since she underwent AKA-I suspect she does not need any further antimicrobial therapy, will stop all IV antibiotics on 6/16 and monitor.  Left trans-metatarsal stump wound dehiscence with infection and gangrene: Per operative note-pus/abscess noted extending up to the knee when BKA was attempted, subsequently underwent a AKA. Note, initially family was very reluctant to proceed with amputation, but given persistent leukocytosis/fever and potential risk of sepsis, family agreed for amputation, subsequently AKA was done on 6/15. See above regarding antimicrobial therapy.  ESRD: Nephrology following-HD on TTS.   Anemia: Likely secondary to chronic disease-probably worsened by acute illness. No signs of bleeding, follow CBC.   DM-2: CBGs stable-continue with SSI.   Chronic systolic heart failure/nonischemic cardiomyopathy (EF 30-35% by TEE on 6/4): Volume status remained stable-this is managed with dialysis-continue with Coreg.   Pericardial effusion: Seen on TEE-with no evidence of tamponade pathophysiology. Repeat echo at some point in the next few weeks.  Moderate protein calorie malnutrition: Continue NG tube feedings  Goals of care: Unfortunate 36 year old female with ESRD, cardiomyopathy, bilateral transmetatarsal amputations complicated by left stump dehiscence-suffered a cardiac arrest on admission-now with anoxic brain injury-ventilator dependent-tracheostomy in place, awaiting PEG tube placement next week. Hospital course has been prolonged-complicated by failure to wean  from the ventilator, persistent systemic inflammatory response syndrome  due to left foot gangrene/infection-after extensive discussion with family-she remains a full code, with family choosing to continue with full aggressive care. 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 Her prognosis is still very guarded at this time, will await attempts to see if she can be liberated off the ventilator. Family is aware of the risk of decompensation and deterioration.   Telemetry (independently reviewed): Sinus tachycardia  Studies: Lower extremity ultrasound 5/18 >> no evidence of DVT CT head 5/19 >> normal exam Echo 5/20 >> EF 30-35%, increased LVF. Diffuse hypokinesis, akinesis of basilar mid-inferior myocardium, grade 2 diastolic dysfxn  EEG 2/12 >> finding c/w mod to severe global cerebral dysfxn. C/w anoxic injury given clinical course  LE [place] 5/21 >> negative MRI brain 5/24 >> ?thrombosed cortical vein, otherwise normal TEE 6/4 >> mild MR, normal AV, mild TR, mild PR, EF 30-35%, diffuse hypokinesis, no thrombus, no PFO, normal RV, moderate pericardial effusion with synechia suggesting some chronicity, no vegetations   Echo (reviewed):EF 30-35% on TEE done on 6/4  Morning labs/Imaging ordered: yes  DVT Prophylaxis: Prophylactic Heparin   Code Status: Full code   Family Communication: Mother at bedside  Disposition Plan: Remain inpatient in SDU  Antimicrobial agents: Anti-infectives    Start     Dose/Rate Route Frequency Ordered Stop   03/14/17 1200  vancomycin (VANCOCIN) IVPB 1000 mg/200 mL premix     1,000 mg 200 mL/hr over 60 Minutes Intravenous Every T-Th-Sa (Hemodialysis) 03/12/17 0912     03/13/17 1430  ceFAZolin (ANCEF) IVPB 2g/100 mL premix  Status:  Discontinued     2 g 200 mL/hr over 30 Minutes Intravenous To ShortStay Surgical 03/12/17 1208 03/13/17 1113   03/13/17 1115  ceFAZolin (ANCEF) IVPB 2g/100 mL premix     2 g 200 mL/hr over 30 Minutes Intravenous  To Surgery 03/13/17 1108 03/13/17 1506   03/12/17 1800  cefTAZidime (FORTAZ) 2 g in dextrose 5 % 50 mL IVPB     2 g 100 mL/hr over 30 Minutes Intravenous Every T-Th-Sa (1800) 03/12/17 0912     03/12/17 1200  vancomycin (VANCOCIN) 1,500 mg in sodium chloride 0.9 % 250 mL IVPB     1,500 mg 250 mL/hr over 60 Minutes Intravenous Every Thu (Hemodialysis) 03/12/17 0912 03/12/17 1356   03/02/17 1200  cefTRIAXone (ROCEPHIN) 2 g in dextrose 5 % 50 mL IVPB     2 g 100 mL/hr over 30 Minutes Intravenous Every 24 hours 03/02/17 0810 03/06/17 1358   03/02/17 1200  vancomycin (VANCOCIN) IVPB 1000 mg/200 mL premix     1,000 mg 200 mL/hr over 60 Minutes Intravenous Every M-W-F (Hemodialysis) 03/02/17 0815 03/02/17 1457   03/02/17 1200  metroNIDAZOLE (FLAGYL) IVPB 500 mg     500 mg 100 mL/hr over 60 Minutes Intravenous Every 8 hours 03/02/17 0948 03/06/17 2230   02/26/17 1200  vancomycin (VANCOCIN) IVPB 1000 mg/200 mL premix     1,000 mg 200 mL/hr over 60 Minutes Intravenous Every T-Th-Sa (Hemodialysis) 02/25/17 1151 03/05/17 1325   02/25/17 1200  vancomycin (VANCOCIN) 2,000 mg in sodium chloride 0.9 % 500 mL IVPB     2,000 mg 250 mL/hr over 120 Minutes Intravenous  Once 02/25/17 1149 02/25/17 1449   02/25/17 0900  cefTRIAXone (ROCEPHIN) 2 g in dextrose 5 % 50 mL IVPB  Status:  Discontinued     2 g 100 mL/hr over 30 Minutes Intravenous Every 24 hours 02/25/17 0814 03/02/17 0810   02/25/17 0900  metroNIDAZOLE (  FLAGYL) IVPB 500 mg  Status:  Discontinued     500 mg 100 mL/hr over 60 Minutes Intravenous Every 8 hours 02/25/17 0814 03/02/17 0948   02/20/17 1200  vancomycin (VANCOCIN) IVPB 1000 mg/200 mL premix  Status:  Discontinued     1,000 mg 200 mL/hr over 60 Minutes Intravenous Every M-W-F (Hemodialysis) 02/19/17 1506 02/20/17 1403   02/17/17 1800  meropenem (MERREM) 500 mg in sodium chloride 0.9 % 50 mL IVPB  Status:  Discontinued     500 mg 100 mL/hr over 30 Minutes Intravenous Daily-1800 02/17/17  1026 02/25/17 0814   02/17/17 1200  vancomycin (VANCOCIN) IVPB 1000 mg/200 mL premix     1,000 mg 200 mL/hr over 60 Minutes Intravenous Every T-Th-Sa (Hemodialysis) 02/14/17 1758 02/19/17 1612   02/14/17 2200  clindamycin (CLEOCIN) IVPB 900 mg  Status:  Discontinued     900 mg 100 mL/hr over 30 Minutes Intravenous Every 8 hours 02/14/17 1106 02/14/17 1928   02/14/17 2000  clindamycin (CLEOCIN) IVPB 900 mg  Status:  Discontinued     900 mg 100 mL/hr over 30 Minutes Intravenous Every 8 hours 02/14/17 1928 02/16/17 1047   02/14/17 1915  piperacillin-tazobactam (ZOSYN) IVPB 3.375 g  Status:  Discontinued     3.375 g 100 mL/hr over 30 Minutes Intravenous Every 12 hours 02/14/17 1801 02/17/17 1026   02/14/17 1845  vancomycin (VANCOCIN) 2,000 mg in sodium chloride 0.9 % 500 mL IVPB     2,000 mg 250 mL/hr over 120 Minutes Intravenous  Once 02/14/17 1758 02/14/17 2107   02/14/17 1115  clindamycin (CLEOCIN) IVPB 900 mg  Status:  Discontinued     900 mg 100 mL/hr over 30 Minutes Intravenous  Once 02/14/17 1106 02/14/17 2236   02/14/17 0945  clindamycin (CLEOCIN) IVPB 600 mg     600 mg 100 mL/hr over 30 Minutes Intravenous  Once 02/14/17 0931 02/14/17 1013      Procedures: ETT 5/19 >> 5/31 Lt IJ CVL 5/19 >> out Trach 5/31 >>  TEE 6/4 >> mild MR, normal AV, mild TR, mild PR, EF 30-35%, diffuse hypokinesis, no thrombus, no PFO, normal RV, moderate pericardial effusion with synechia suggesting some chronicity, no vegetations  Left AKA 6/15>>  CONSULTS:  Infectious disease:  Neurology  Gastroenterology  Wound care  PCCM  Nephrology  Orthopedics  Time spent: 30 minutes-Greater than 50% of this time was spent in counseling, explanation of diagnosis, planning of further management, and coordination of care.  MEDICATIONS: Scheduled Meds: . calcium carbonate (dosed in mg elemental calcium)  250 mg of elemental calcium Oral TID  . carvedilol  12.5 mg Oral BID WC  . chlorhexidine  gluconate (MEDLINE KIT)  15 mL Mouth Rinse BID  . darbepoetin (ARANESP) injection - DIALYSIS  200 mcg Intravenous Q Tue-HD  . feeding supplement (NEPRO CARB STEADY)  1,000 mL Per Tube Q24H  . feeding supplement (PRO-STAT SUGAR FREE 64)  60 mL Per Tube QID  . [name]'s butt cream   Topical QID  . heparin  5,000 Units Subcutaneous Q8H  . insulin aspart  0-15 Units Subcutaneous Q4H  . mouth rinse  15 mL Mouth Rinse QID  . multivitamin  1 tablet Oral QHS  . pantoprazole sodium  40 mg Per Tube Q24H  . sodium chloride flush  10-40 mL Intracatheter Q12H   Continuous Infusions: . sodium chloride 10 mL/hr at 03/13/17 1832  . sodium chloride    . sodium chloride    . sodium chloride    .  sodium chloride    . cefTAZidime (FORTAZ)  IV    . vancomycin 1,000 mg (03/14/17 1102)   PRN Meds:.sodium chloride, sodium chloride, sodium chloride, sodium chloride, acetaminophen (TYLENOL) oral liquid 160 mg/5 mL, acetaminophen **OR** acetaminophen, albuterol, alteplase, fentaNYL (SUBLIMAZE) injection, heparin, heparin, HYDROmorphone (DILAUDID) injection, lidocaine (PF), lidocaine-prilocaine, metoCLOPramide **OR** metoCLOPramide (REGLAN) injection, metoprolol tartrate, ondansetron **OR** ondansetron (ZOFRAN) IV, pentafluoroprop-tetrafluoroeth, sodium chloride flush   PHYSICAL EXAM: Vital signs: Vitals:   03/14/17 1130 03/14/17 1145 03/14/17 1200 03/14/17 1212  BP: 126/87 112/85 127/87   Pulse: (!) 116 (!) 118 (!) 117   Resp:   17   Temp:   98.5 F (36.9 C)   TempSrc:   Oral   SpO2:   100% 100%  Weight:   83.9 kg (185 lb)   Height:       Filed Weights   03/14/17 0421 03/14/17 0740 03/14/17 1200  Weight: 85.7 kg (189 lb) 83.9 kg (185 lb) 83.9 kg (185 lb)   Body mass index is 27.72 kg/m.  General appearance:Awake, follows commands. Able to whisper yes today HEENT: Atraumatic, normocephalic. Neck: Supple, tracheostomy in place. Resp: Moving air well-some transmitted upper airway sounds. CVS:  Heart sounds regular-slightly tachycardic. GI: Soft, nontender and nondistended. Extremities: Left AKA stump-wound VAC in place. Musculoskeletal: No distal cyanosis. Wounds: N/a  I have personally reviewed following labs and imaging studies  LABORATORY DATA: CBC:  Recent Labs Lab 03/10/17 0721 03/11/17 0842 03/12/17 0702 03/13/17 0557 03/14/17 0449  WBC 33.8* 28.5* 24.0* 23.4* 22.8*  NEUTROABS  --  23.0* 20.2*  --  19.6*  HGB 8.9* 9.1* 8.9* 8.1* 7.6*  HCT 29.9* 30.5* 29.5* 27.2* 25.7*  MCV 96.8 97.4 96.7 97.5 96.6  PLT 460* 377 380 363 762    Basic Metabolic Panel:  Recent Labs Lab 03/10/17 0722 03/11/17 0842 03/13/17 0557 03/14/17 0449  NA 142 140 136 136  K 3.2* 4.0 4.1 4.4  CL 101 98* 96* 97*  CO2 _0 GLUCOSE 173* 175* 172* 146*  BUN 131* 74* 45* 63*  CREATININE 6.92* 4.65* 3.78* 5.26*  CALCIUM 9.6 9.3 9.0 8.6*  PHOS 4.4 3.4 3.0 5.1*    GFR: Estimated Creatinine Clearance: 16.8 mL/min (A) (by C-G formula based on SCr of 5.26 mg/dL (H)).  Liver Function Tests:  Recent Labs Lab 03/10/17 0722 03/11/17 0842 03/13/17 0557 03/14/17 0449  ALBUMIN 2.2* 2.5* 2.4* 2.4*   No results for input(s): LIPASE, AMYLASE in the last 168 hours. No results for input(s): AMMONIA in the last 168 hours.  Coagulation Profile: No results for input(s): INR, PROTIME in the last 168 hours.  Cardiac Enzymes: No results for input(s): CKTOTAL, CKMB, CKMBINDEX, TROPONINI in the last 168 hours.  BNP (last 3 results) No results for input(s): PROBNP in the last 8760 hours.  HbA1C: No results for input(s): HGBA1C in the last 72 hours.  CBG:  Recent Labs Lab 03/13/17 1556 03/13/17 1917 03/13/17 2334 03/14/17 0422 03/14/17 1242  GLUCAP 121* 131* 141* 151* 147*    Lipid Profile: No results for input(s): CHOL, HDL, LDLCALC, TRIG, CHOLHDL, LDLDIRECT in the last 72 hours.  Thyroid Function Tests: No results for input(s): TSH, T4TOTAL, FREET4, T3FREE, THYROIDAB  in the last 72 hours.  Anemia Panel: No results for input(s): VITAMINB12, FOLATE, FERRITIN, TIBC, IRON, RETICCTPCT in the last 72 hours.  Urine analysis:    Component Value Date/Time   COLORURINE AMBER (A) 02/14/2017 2111   APPEARANCEUR HAZY (A) 02/14/2017 2111  LABSPEC 1.015 02/14/2017 2111   PHURINE 5.0 02/14/2017 2111   GLUCOSEU NEGATIVE 02/14/2017 2111   HGBUR SMALL (A) 02/14/2017 2111   BILIRUBINUR NEGATIVE 02/14/2017 2111   KETONESUR NEGATIVE 02/14/2017 2111   PROTEINUR 30 (A) 02/14/2017 2111   UROBILINOGEN 0.2 09/21/2014 1908   NITRITE NEGATIVE 02/14/2017 2111   LEUKOCYTESUR TRACE (A) 02/14/2017 2111    Sepsis Labs: Lactic Acid, Venous    Component Value Date/Time   LATICACIDVEN 4.6 ([place]) 02/14/2017 1807    MICROBIOLOGY: Recent Results (from the past 240 hour(s))  C difficile quick scan w PCR reflex     Status: None   Collection Time: 03/04/17  2:17 PM  Result Value Ref Range Status   C Diff antigen NEGATIVE NEGATIVE Final   C Diff toxin NEGATIVE NEGATIVE Final   C Diff interpretation No C. difficile detected.  Final  Culture, blood (routine x 2)     Status: None (Preliminary result)   Collection Time: 03/11/17  8:42 AM  Result Value Ref Range Status   Specimen Description BLOOD RIGHT HAND  Final   Special Requests   Final    BOTTLES DRAWN AEROBIC ONLY Blood Culture adequate volume   Culture NO GROWTH 3 DAYS  Final   Report Status PENDING  Incomplete  Culture, blood (routine x 2)     Status: None (Preliminary result)   Collection Time: 03/11/17  8:46 AM  Result Value Ref Range Status   Specimen Description BLOOD RIGHT HAND  Final   Special Requests   Final    BOTTLES DRAWN AEROBIC AND ANAEROBIC Blood Culture adequate volume   Culture NO GROWTH 3 DAYS  Final   Report Status PENDING  Incomplete    RADIOLOGY STUDIES/RESULTS: Ct Abdomen Wo Contrast  Result Date: 03/02/2017 CLINICAL DATA:  Preop for gastrostomy to EXAM: CT ABDOMEN WITHOUT CONTRAST TECHNIQUE:  Multidetector CT imaging of the abdomen was performed following the standard protocol without IV contrast. COMPARISON:  None. FINDINGS: Lower chest: Bibasilar dependent and medial consolidation. Large pericardial effusion. Hepatobiliary: The left lobe of the liver is prominent and anterior to the stoma. The gallbladder is not clearly visualized. It may either be decompressed or absent due to cholecystectomy. Pancreas: Unremarkable Spleen: Unremarkable Adrenals/Urinary Tract: Kidneys and adrenal glands are within normal limits. Stomach/Bowel: The stomach is positioned deep to the liver and colon. Gastrostomy tube placement may be problematic. No evidence of small-bowel obstruction. Feeding tube tip is in the proximal duodenum. Vascular/Lymphatic: Small para-aortic lymph nodes. Atherosclerotic vascular calcifications are noted. No evidence of aortic aneurysm. Other: No free-fluid. Musculoskeletal: No vertebral compression deformity. IMPRESSION: The stomach is positioned deep to a prominent left lobe of the liver as well as the transverse colon. Gas is distension of the stomach and barium opacification of the transverse colon will be essential during the procedure. Bibasilar pulmonary consolidation. Large pericardial effusion. Electronically Signed   By: Marybelle Killings M.D.   On: 03/02/2017 07:18   Dg Chest 2 View  Result Date: 02/14/2017 CLINICAL DATA:  Hypoxia. Pt came to ED with left foot pain post amputation of toes. Hx diabetes, PNA, COPD, HTN, MI, CAD, CKD, CHF. EXAM: CHEST  2 VIEW COMPARISON:  04/22/2016 FINDINGS: Moderate enlargement of the cardiopericardial silhouette, stable. No mediastinal or hilar masses. No evidence of adenopathy. Clear lungs.  No pleural effusion.  No pneumothorax. Skeletal structures are unremarkable. IMPRESSION: 1. No acute cardiopulmonary disease. 2. Stable moderate cardiomegaly. Electronically Signed   By: Lajean Manes M.D.   On: 02/14/2017 09:26  Ct Head Wo Contrast  Result  Date: 02/14/2017 CLINICAL DATA:  Cardiac arrest and, EXAM: CT HEAD WITHOUT CONTRAST TECHNIQUE: Contiguous axial images were obtained from the base of the skull through the vertex without intravenous contrast. COMPARISON:  None. FINDINGS: Brain: No mass lesion, intraparenchymal hemorrhage or extra-axial collection. No evidence of acute cortical infarct. Brain parenchyma and CSF-containing spaces are normal for age. Mildly asymmetric hypoattenuation of the right temporal lobe relative to the left is likely artifactual Vascular: No hyperdense vessel or unexpected calcification. Skull: Normal visualized skull base, calvarium and extracranial soft tissues. Sinuses/Orbits: No sinus fluid levels or advanced mucosal thickening. No mastoid effusion. Normal orbits. IMPRESSION: Normal head CT. Electronically Signed   By: Ulyses Jarred M.D.   On: 02/14/2017 22:23   Mr Brain Wo Contrast  Result Date: 02/18/2017 CLINICAL DATA:  Anoxic brain injury status post cardiac arrest EXAM: MRI HEAD WITHOUT CONTRAST TECHNIQUE: Multiplanar, multiecho pulse sequences of the brain and surrounding structures were obtained without intravenous contrast. IV contrast could not be administered due to the patient's poor renal function. COMPARISON:  Head CT 02/14/2017 FINDINGS: Brain: The midline structures are normal. There is no focal diffusion restriction to indicate acute infarct. The brain parenchymal signal is normal and there is no mass lesion. There is a focal susceptibility abnormality at the left insula (series 12, image 55). Brain volume is normal for age without age-advanced or lobar predominant atrophy. The dura is normal and there is no extra-axial collection. Vascular: Major intracranial arterial and venous sinus flow voids are preserved. Skull and upper cervical spine: The visualized skull base, calvarium, upper cervical spine and extracranial soft tissues are normal. Sinuses/Orbits: No fluid levels or advanced mucosal thickening.  No mastoid effusion. Normal orbits. IMPRESSION: 1. Focus of susceptibility in the left insula may indicate a thrombosed cortical vein. The location does not correspond to a large artery. 2. No associated ischemia or hemorrhage.  Otherwise normal brain. Electronically Signed   By: Ulyses Jarred M.D.   On: 02/18/2017 14:55   Ir Fluoro Guide Cv Line Right  Result Date: 03/11/2017 INDICATION: Osteomyelitis EXAM: TUNNELED RIGHT JUGULAR PICC LINE PLACEMENT WITH ULTRASOUND AND FLUOROSCOPIC GUIDANCE MEDICATIONS: None. ANESTHESIA/SEDATION: None FLUOROSCOPY TIME:  Fluoroscopy Time:  minutes 30 seconds (1 mGy). COMPLICATIONS: None immediate. PROCEDURE: The patient was advised of the possible risks and complications and agreed to undergo the procedure. The patient was then brought to the angiographic suite for the procedure. The right neck was prepped with chlorhexidine, draped in the usual sterile fashion using maximum barrier technique (cap and mask, sterile gown, sterile gloves, large sterile sheet, hand hygiene and cutaneous antiseptic). Local anesthesia was attained by infiltration with 1% lidocaine. Ultrasound demonstrated patency of the right jugular vein, and this was documented with an image. Under real-time ultrasound guidance, this vein was accessed with a 21 gauge micropuncture needle and image documentation was performed. A long subcutaneous tract was employed. The needle was exchanged over a guidewire for a peel-away sheath through which a 26 cm 5 [place] double lumen power injectable PICC was advanced, and positioned with its tip at the lower SVC/right atrial junction. The cuff was positioned in the subcutaneous tract. Fluoroscopy during the procedure and fluoro spot radiograph confirms appropriate catheter position. The catheter was flushed, secured to the skin with Prolene sutures, and covered with a sterile dressing. IMPRESSION: Successful placement of a tunneled right jugular PICC with sonographic and  fluoroscopic guidance. The catheter is ready for use. Electronically Signed   By: Arnell Sieving  Hoss M.D.   On: 03/11/2017 15:31   Ir US Guide Vasc Access Right  Result Date: 03/11/2017 INDICATION: Osteomyelitis EXAM: TUNNELED RIGHT JUGULAR PICC LINE PLACEMENT WITH ULTRASOUND AND FLUOROSCOPIC GUIDANCE MEDICATIONS: None. ANESTHESIA/SEDATION: None FLUOROSCOPY TIME:  Fluoroscopy Time:  minutes 30 seconds (1 mGy). COMPLICATIONS: None immediate. PROCEDURE: The patient was advised of the possible risks and complications and agreed to undergo the procedure. The patient was then brought to the angiographic suite for the procedure. The right neck was prepped with chlorhexidine, draped in the usual sterile fashion using maximum barrier technique (cap and mask, sterile gown, sterile gloves, large sterile sheet, hand hygiene and cutaneous antiseptic). Local anesthesia was attained by infiltration with 1% lidocaine. Ultrasound demonstrated patency of the right jugular vein, and this was documented with an image. Under real-time ultrasound guidance, this vein was accessed with a 21 gauge micropuncture needle and image documentation was performed. A long subcutaneous tract was employed. The needle was exchanged over a guidewire for a peel-away sheath through which a 26 cm 5 [place] double lumen power injectable PICC was advanced, and positioned with its tip at the lower SVC/right atrial junction. The cuff was positioned in the subcutaneous tract. Fluoroscopy during the procedure and fluoro spot radiograph confirms appropriate catheter position. The catheter was flushed, secured to the skin with Prolene sutures, and covered with a sterile dressing. IMPRESSION: Successful placement of a tunneled right jugular PICC with sonographic and fluoroscopic guidance. The catheter is ready for use. Electronically Signed   By: Marybelle Killings M.D.   On: 03/11/2017 15:31   Dg Chest Port 1 View  Result Date: 03/11/2017 CLINICAL DATA:  Leukocytosis.  EXAM: PORTABLE CHEST 1 VIEW COMPARISON:  03/03/2017 FINDINGS: Patient is rotated to the left. The cardio pericardial silhouette is enlarged. Slight improvement in left base aeration. Tracheostomy tube remains in place. A feeding tube passes into the stomach although the distal tip position is not included on the film. The visualized bony structures of the thorax are intact. Telemetry leads overlie the chest. IMPRESSION: Persistent enlargement of the cardiopericardial silhouette without evidence for overt pulmonary edema or substantial pleural effusion. Electronically Signed   By: Misty Stanley M.D.   On: 03/11/2017 08:37   Dg Chest Port 1 View  Result Date: 03/03/2017 CLINICAL DATA:  Respiratory failure. EXAM: PORTABLE CHEST 1 VIEW COMPARISON:  03/01/2017 . FINDINGS: Tracheostomy tube and feeding tube in stable position. Cardiomegaly with mild diffuse interstitial prominence suggesting mild CHF. Persistent atelectasis and consolidation left lower lobe. Small left pleural effusion cannot be excluded. No pneumothorax. IMPRESSION: 1. Tracheostomy tube and feeding tube in stable position. 2. Cardiomegaly with diffuse mild interstitial prominence suggesting mild CHF. 3. Persistent left lower lobe atelectasis and consolidation. Small left pleural effusion cannot be excluded . Electronically Signed   By: Marcello Moores  Register   On: 03/03/2017 07:09   Dg Chest Port 1 View  Result Date: 03/01/2017 CLINICAL DATA:  Respiratory failure EXAM: PORTABLE CHEST 1 VIEW COMPARISON:  02/27/2017 FINDINGS: 0546 hours. Patient rotated to the left. Tracheostomy tube remains in place. A feeding tube passes into the stomach although the distal tip position is not included on the film. The cardio pericardial silhouette is enlarged. Left base collapse/consolidation again noted. There is pulmonary vascular congestion without overt pulmonary edema. IMPRESSION: Rotated film with cardiomegaly and vascular congestion. Persistent left base  collapse/ consolidation. Electronically Signed   By: Misty Stanley M.D.   On: 03/01/2017 07:43   Dg Chest Port 1 View  Result Date:  02/27/2017 CLINICAL DATA:  Hypoxia. EXAM: PORTABLE CHEST 1 VIEW COMPARISON:  02/26/2017. FINDINGS: Tracheostomy to in stable position. Stable cardiomegaly. Low lung volumes. Progressive left lower lobe atelectasis and infiltrate. Small left pleural effusion cannot be excluded . IMPRESSION: 1. Tracheostomy tube in stable position. 2. Progressive left lower lobe atelectasis and infiltrate. Small left pleural effusion cannot be excluded . Electronically Signed   By: Marcello Moores  Register   On: 02/27/2017 07:01   Dg Chest Port 1 View  Result Date: 02/26/2017 CLINICAL DATA:  Status post tracheostomy placement. EXAM: PORTABLE CHEST 1 VIEW COMPARISON:  Earlier today. FINDINGS: The endotracheal tube has been removed and replaced with a tracheostomy tube in satisfactory position. The nasogastric tube and esophageal probe have been removed. The left jugular catheter has been removed. No pneumothorax. Grossly stable enlarged cardiac silhouette. Left lower lobe opacity with improvement laterally since 02/25/2017. Clear right lung. Unremarkable bones. IMPRESSION: 1. Tracheostomy tube in satisfactory position. 2. Left lower lobe atelectasis or pneumonia with mild improvement since 2 days ago. 3. Stable cardiomegaly. Electronically Signed   By: Claudie Revering M.D.   On: 02/26/2017 16:02   Dg Chest Port 1 View  Result Date: 02/26/2017 CLINICAL DATA:  Hypoxia.  Shortness of breath. EXAM: PORTABLE CHEST 1 VIEW COMPARISON:  02/25/2017. FINDINGS: Endotracheal tube, NG tube, left IJ line esophageal probe in stable position. Cardiomegaly with bilateral pulmonary interstitial infiltrates consistent with CHF. Left base atelectasis . Small left pleural effusion. Similar findings noted on prior exam. No pneumothorax. IMPRESSION: 1. Lines and tubes in stable position. 2. Cardiomegaly with bilateral pulmonary  interstitial prominence and small left pleural effusion noted consistent CHF. Left base atelectasis. Similar findings noted on prior exam. Electronically Signed   By: [place]   On: 02/26/2017 07:18   Dg Chest Port 1 View  Result Date: 02/25/2017 CLINICAL DATA:  Respiratory failure, intubated patient. Diabetes, cardiomyopathy, morbid obesity, end-stage renal disease. EXAM: PORTABLE CHEST 1 VIEW COMPARISON:  Portable chest x-ray of Feb 24, 2017 FINDINGS: The lungs are adequately inflated. The pulmonary interstitial markings are slightly less prominent today. The pulmonary vascularity remains engorged. The cardiac silhouette remains enlarged. Retrocardiac region on the left is slightly less dense. The endotracheal tube tip lies 5.7 cm above the carina. The esophagogastric tube tip projects below the inferior margin of the image. The left internal jugular venous catheter tip projects over the proximal SVC. IMPRESSION: Slight interval improvement in pulmonary interstitial edema. Stable cardiomegaly. Persistent left lower lobe atelectasis or pneumonia. The support tubes are in reasonable position. Electronically Signed   By: David  Martinique M.D.   On: 02/25/2017 07:57   Dg Chest Port 1 View  Result Date: 02/24/2017 CLINICAL DATA:  Respiratory failure EXAM: PORTABLE CHEST 1 VIEW COMPARISON:  Two days ago FINDINGS: Endotracheal tube tip at the clavicular heads. An orogastric tube reaches the stomach. Esophageal thermistor. Left IJ central line with tip at the SVC level. Cardiomegaly and diffuse hazy opacity with cephalized blood flow. No definitive effusion. No pneumothorax. IMPRESSION: 1. Stable positioning of tubes and central line. 2. CHF pattern. Electronically Signed   By: Monte Fantasia M.D.   On: 02/24/2017 07:31   Dg Chest Port 1 View  Result Date: 02/22/2017 CLINICAL DATA:  Respiratory failure EXAM: PORTABLE CHEST 1 VIEW COMPARISON:  02/21/2017 FINDINGS: Endotracheal tube terminates 5 cm above  the carina. Cardiomegaly with mild interstitial edema. Retrocardiac opacity, possibly reflecting a combination of atelectasis and pleural effusion, pneumonia not excluded. No pneumothorax. Left IJ venous catheter  terminates at the cavoatrial junction. Enteric tube courses into the stomach. IMPRESSION: Endotracheal tube terminates 5 cm above the carina. Cardiomegaly with mild interstitial edema. Retrocardiac opacity, possibly atelectasis and pleural effusion, pneumonia not excluded. Electronically Signed   By: Julian Hy M.D.   On: 02/22/2017 07:30   Dg Chest Port 1 View  Result Date: 02/21/2017 CLINICAL DATA:  Endotracheal tube. EXAM: PORTABLE CHEST 1 VIEW COMPARISON:  02/19/2017 FINDINGS: Endotracheal tube terminates approximately 5 cm above the carina. Enteric tube courses into the left upper abdomen with tip not imaged. Left jugular catheter terminates over the mid SVC. The cardiac silhouette remains enlarged. Pulmonary vascular congestion and bilateral parenchymal lung opacities have not significantly changed. No large pleural effusion or pneumothorax is identified. IMPRESSION: Unchanged pulmonary edema. Electronically Signed   By: Logan Bores M.D.   On: 02/21/2017 07:42   Dg Chest Port 1 View  Result Date: 02/19/2017 CLINICAL DATA:  36 year old female with respiratory failure. EXAM: PORTABLE CHEST 1 VIEW COMPARISON:  02/18/2017 and prior radiograph FINDINGS: An endotracheal tube with tip 3.5 cm above the carina, left IJ central venous catheter with tip overlying the lower SVC, and NG tube entering the stomach with tip off the field of view again noted. Pulmonary edema has decreased from the prior study. Continued left lower lung atelectasis/ consolidation again noted. There is no evidence of pneumothorax. No other changes are noted. IMPRESSION: Decreased pulmonary edema, otherwise unchanged appearance of the chest. Electronically Signed   By: Margarette Canada M.D.   On: 02/19/2017 08:03   Dg Chest  Port 1 View  Result Date: 02/18/2017 CLINICAL DATA:  Respiratory failure EXAM: PORTABLE CHEST 1 VIEW COMPARISON:  02/17/2017 FINDINGS: Cardiac shadow remains enlarged. Endotracheal tube, nasogastric catheter and esophageal probe are again seen and stable. Left jugular central line is again noted and stable. No pneumothorax is seen. Diffuse vascular congestion and increased parenchymal opacity is noted consistent with progressive CHF. No focal confluent infiltrate is noted. IMPRESSION: Progressive changes of CHF. Electronically Signed   By: Inez Catalina M.D.   On: 02/18/2017 08:15   Dg Chest Port 1 View  Result Date: 02/17/2017 CLINICAL DATA:  Pulmonary edema EXAM: PORTABLE CHEST 1 VIEW COMPARISON:  02/16/2017 FINDINGS: Endotracheal tube, NG tube, left jugular central venous catheter are stable. Severe cardiomegaly is stable. Mild vascular congestion is stable. No consolidation. No interstitial edema. IMPRESSION: Stable cardiomegaly and mild vascular congestion. Electronically Signed   By: Marybelle Killings M.D.   On: 02/17/2017 07:35   Dg Chest Port 1 View  Result Date: 02/16/2017 CLINICAL DATA:  Acute respiratory failure. EXAM: PORTABLE CHEST 1 VIEW COMPARISON:  Radiograph of Feb 14, 2017. FINDINGS: Stable cardiomegaly. Endotracheal and nasogastric tubes are unchanged in position. Left internal jugular catheter is unchanged with distal tip in expected position of SVC. No pneumothorax is noted. Lungs are clear. Bony thorax is unremarkable. IMPRESSION: Stable support apparatus.  Stable cardiomegaly. Electronically Signed   By: Marijo Conception, M.D.   On: 02/16/2017 07:30   Dg Chest Port 1 View  Result Date: 02/14/2017 CLINICAL DATA:  Central line placement EXAM: PORTABLE CHEST 1 VIEW COMPARISON:  02/14/2017 at 0913 hours FINDINGS: Endotracheal tube terminates 4.5 cm above the carina. Lungs are clear.  No pleural effusion or pneumothorax. Cardiomegaly. Left IJ venous catheter terminates at cavoatrial  junction. Enteric tube courses into the stomach. Defibrillator pads overlying the left hemithorax. IMPRESSION: Endotracheal tube terminates 4.5 cm above the carina. Left IJ venous catheter terminates at the cavoatrial  junction. Electronically Signed   By: Julian Hy M.D.   On: 02/14/2017 18:31   Dg Abd Portable 1v  Result Date: 03/02/2017 CLINICAL DATA:  Encounter for feeding tube placement EXAM: PORTABLE ABDOMEN - 1 VIEW COMPARISON:  Portable exam 1712 hours compared to CT abdomen and pelvis of 03/01/2017 FINDINGS: Feeding tube traverses abdomen with tip projecting over distal antrum near pylorus. Cardiac silhouette appears enlarged. Visualized bowel gas pattern normal. Osseous structures unremarkable. IMPRESSION: Tip of feeding tube projects over distal gastric antrum near pylorus. Electronically Signed   By: Lavonia Dana M.D.   On: 03/02/2017 17:21   Dg Abd Portable 1v  Result Date: 02/27/2017 CLINICAL DATA:  Feeding tube placement EXAM: PORTABLE ABDOMEN - 1 VIEW COMPARISON:  None. FINDINGS: Feeding tube with the tip projecting over the antrum of the stomach. There is no bowel dilatation to suggest obstruction. There is no evidence of pneumoperitoneum, portal venous gas or pneumatosis. There are no pathologic calcifications along the expected course of the ureters. The osseous structures are unremarkable. IMPRESSION: Feeding tube with the tip projecting over the antrum of the stomach. Electronically Signed   By: Kathreen Devoid   On: 02/27/2017 11:05   Dg Foot Complete Left  Result Date: 02/14/2017 CLINICAL DATA:  Pt c/o severe pain and inflammation in left foot. 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 Weeping evident at site of amputation. Pt reports nausea today. Hx diabetes, HTN, CKD. EXAM: LEFT FOOT - COMPLETE 3+ VIEW COMPARISON:  12/29/2016 FINDINGS: Postop changes including resection of the proximal first and fourth metatarsals since prior exam. Small proximal fragments of the  second third and fifth metatarsals are identified. There is some indistinctness of the resection margin of the third and fifth metatarsal fragments suggesting osteomyelitis. There is a soft tissue defect distal to the resection margin seen best on the lateral projection. IMPRESSION: 1. Interval postop changes. Indistinctness of residual third and fifth metatarsal fragments, suggesting osteomyelitis. Electronically Signed   By: Lucrezia Europe M.D.   On: 02/14/2017 08:15   US Abdomen Limited Ruq  Result Date: 02/18/2017 CLINICAL DATA:  Elevated liver function studies. EXAM: US ABDOMEN LIMITED - RIGHT UPPER QUADRANT COMPARISON:  None. FINDINGS: Gallbladder: No gallstones or wall thickening visualized. No sonographic Murphy sign noted by sonographer. Common bile duct: Diameter: 7 mm, normal Liver: Circumscribed hyperechoic lesion with central hypoechoic appearance demonstrated along the dome of the liver measuring 2.8 cm maximal diameter. This is likely to represent a cavernous hemangioma, but the appearance on ultrasound is somewhat atypical. Consider follow-up with elective MRI of the liver for better characterization. IMPRESSION: No evidence of cholelithiasis or cholecystitis. Circumscribed hyperechoic lesion in the dome of the liver measuring 2.8 cm maximal diameter. This is probably a cavernous hemangioma but appearance is somewhat atypical. Suggest elective MRI liver in follow-up for better characterization. Electronically Signed   By: Lucienne Capers M.D.   On: 02/18/2017 02:10     LOS: 28 days   Oren Binet, MD  [facility] Pager:[phone]  If 7PM-7AM, please contact night-coverage [email or web address] Password [email or web address] 03/14/2017, 1:34 PM

## 2017-03-14 NOTE — Procedures (Addendum)
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  More interactive.   I was present at this dialysis session, have reviewed the session itself and made  appropriate changes Vinson Moselle MD [facility] pager [phone]   03/14/2017, 1:50 PM

## 2017-03-14 NOTE — Progress Notes (Signed)
Patient on 30% ATC vent on standby. 

## 2017-03-15 LAB — RENAL FUNCTION PANEL
Albumin: 2.2 g/dL — ABNORMAL LOW (ref 3.5–5.0)
Anion gap: 13 (ref 5–15)
BUN: 29 mg/dL — AB (ref 6–20)
CHLORIDE: 95 mmol/L — AB (ref 101–111)
CO2: 27 mmol/L (ref 22–32)
Calcium: 8.3 mg/dL — ABNORMAL LOW (ref 8.9–10.3)
Creatinine, Ser: 3.17 mg/dL — ABNORMAL HIGH (ref 0.44–1.00)
GFR calc Af Amer: 20 mL/min — ABNORMAL LOW (ref >60)
GFR, EST NON AFRICAN AMERICAN: 18 mL/min — AB (ref >60)
Glucose, Bld: 157 mg/dL — ABNORMAL HIGH (ref 65–99)
Phosphorus: 3.9 mg/dL (ref 2.5–4.6)
Potassium: 3.5 mmol/L (ref 3.5–5.1)
SODIUM: 135 mmol/L (ref 135–145)

## 2017-03-15 LAB — CBC
HCT: 25.5 % — ABNORMAL LOW (ref 36.0–46.0)
Hemoglobin: 7.5 g/dL — ABNORMAL LOW (ref 12.0–15.0)
MCH: 28.8 pg (ref 26.0–34.0)
MCHC: 29.4 g/dL — ABNORMAL LOW (ref 30.0–36.0)
MCV: 98.1 fL (ref 78.0–100.0)
Platelets: 359 10*3/uL (ref 150–400)
RBC: 2.6 MIL/uL — AB (ref 3.87–5.11)
RDW: 18.1 % — ABNORMAL HIGH (ref 11.5–15.5)
WBC: 24.9 10*3/uL — AB (ref 4.0–10.5)

## 2017-03-15 LAB — GLUCOSE, CAPILLARY
GLUCOSE-CAPILLARY: 160 mg/dL — AB (ref 65–99)
GLUCOSE-CAPILLARY: 164 mg/dL — AB (ref 65–99)
GLUCOSE-CAPILLARY: 173 mg/dL — AB (ref 65–99)
Glucose-Capillary: 139 mg/dL — ABNORMAL HIGH (ref 65–99)
Glucose-Capillary: 137 mg/dL — ABNORMAL HIGH (ref 65–99)
Glucose-Capillary: 155 mg/dL — ABNORMAL HIGH (ref 65–99)
Glucose-Capillary: 125 mg/dL — ABNORMAL HIGH (ref 65–99)

## 2017-03-15 MED ORDER — CEFTAZIDIME 2 G IJ SOLR
2.0000 g | INTRAMUSCULAR | Status: DC
  Administered 2017-03-17: 2 g via INTRAVENOUS
  Filled 2017-03-15: qty 2

## 2017-03-15 MED ORDER — VANCOMYCIN HCL IN DEXTROSE 1-5 GM/200ML-% IV SOLN
1000.0000 mg | INTRAVENOUS | Status: DC
  Administered 2017-03-17: 1000 mg via INTRAVENOUS
  Filled 2017-03-15: qty 200

## 2017-03-15 NOTE — Progress Notes (Signed)
Patient off of vent and on 30% ATC. 

## 2017-03-15 NOTE — Progress Notes (Signed)
Pharmacy Antibiotic Note  [name] is a 36 y.o. 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 Of note, pt is ESRD on HD.   Abx D#4 (resumed 6/14) on vanc and ceftaz with HD. Has not gotten any ceftaz yet. No off schedule HDs.  Spiking fevers, WBC up to 24.9.   Per MD, plan is to continue ABX for 3-5 days from 6/15  Plan: Continue Vancomycin 1g IV post HD-TTS  Continue Ceftazidime 2g IV on TTS @ 1800 Will continue to follow HD schedule/duration, culture results, LOT, and antibiotic de-escalation plans   Height: 5' 8.5" (174 cm) Weight: 188 lb (85.3 kg) IBW/kg (Calculated) : 65.05  Temp (24hrs), Avg:99.5 F (37.5 C), Min:98.3 F (36.8 C), Max:100.9 F (38.3 C)   Recent Labs Lab 03/10/17 0722 03/11/17 0842 03/12/17 0702 03/13/17 0557 03/14/17 0449 03/15/17 0455  WBC  --  28.5* 24.0* 23.4* 22.8* 24.9*  CREATININE 6.92* 4.65*  --  3.78* 5.26* 3.17*    Estimated Creatinine Clearance: 28.1 mL/min (A) (by C-G formula based on SCr of 3.17 mg/dL (H)).    Allergies  Allergen Reactions  . Aspirin Anaphylaxis  . Sulfur Hives  . Tramadol Hives and Other (See Comments)    Pt states she feels weird   . Gabapentin Other (See Comments)    Lethargic     Antimicrobials this admission: Vanc 5/19>> 5/25;  5/30 > 6/8; restart 6/14 Zosyn 5/19>> 5/22 Clindamycin 5/19>> 5/21 Meropenem 5/22 >> 5/30 Ceftriax 5/30> 6/8 Metronid 5/30> 6/8 Ceftazidime 6/14 >>  Dose adjustments this admission: 5/25 pre-HD VR: 23 (on 1 g w/ HD)  Microbiology results:  5/19 BCx: ngf 5/19: MRSA PCR: neg 6/1 BCx: ngf 6/1 CDiff: neg 6/13 BCx: ngf     Thank you for allowing pharmacy to be a part of this patient's care.  Allena Katz, Pharm.D. PGY1 Pharmacy Resident 6/17/201811:49 AM Pager [phone]

## 2017-03-15 NOTE — Progress Notes (Signed)
Vent on standby patient on 35% ATC.  

## 2017-03-15 NOTE — Progress Notes (Signed)
[facility] Progress Note   Subjective: stable  Objective Vitals:   03/15/17 0806 03/15/17 0944 03/15/17 1046 03/15/17 1118  BP:   106/65   Pulse:   (!) 113   Resp:      Temp:  98.3 F (36.8 C)    TempSrc:  Oral    SpO2: 98%   95%  Weight:      Height:       Physical Exam General: chronically ill appearing female in NAD, responds to simple command  Heart: K1,S0, 2/^ systolic M. No JVD. HR 120s Lungs: trach in place, off vent on trach collar, coarse bilat upper airway noises Abdomen: active BS Feeding tube present. Extremities: L AKA w/ VAC in place, no edema RLE Dialysis Access: LUA AVF + bruit   Dialysis: [facility] TTS   4h 12mn  F200   114kg  2/2 bath  LUA AVF  Hep none - Iron Sucrose (Venofer) 50 mg IVP During Dialysis 1X Week  - Mircera 75 mcg IV q 2 weeks ( last on 02/05/17) - Vitamin D (Calcitriol) Oral0.75 mcg Po q hd    Assess: 1. 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 VDRF: trach- dependent, off vent now 3. 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 ESRD - HD TTS, 4h 161m 5. Anemia - HGB 8.4. Cont ESA. No Fe D/T osteo.  6. Secondary hyperparathyroidism - Ca 9.2 Phos 3.9, s/p Ca++ supp, now on 250 tid CaCO3  7. HTN/volume - BP's ok, cont coreg at 12.5 bid (25 bid at home), holding home isosorbide 8. Nutrition: Albumin 2.2 On tube feeding prostat/nepro  9. DM: per primary 10. NICM H/O diastolic/systolic HF  Plan - HD TTS   [facility] SplinterD [facility] pager [phone] 03/10/2017, 12:55 PM    Additional Objective Labs: Basic Metabolic Panel:  Recent Labs Lab 03/13/17 0557 03/14/17 0449 03/15/17 0455  NA 136 136 135  K 4.1 4.4 3.5  CL 96* 97* 95*  CO2 _0 GLUCOSE 172* 146* 157*  BUN 45* 63* 29*  CREATININE 3.78* 5.26* 3.17*  CALCIUM 9.0 8.6* 8.3*  PHOS 3.0 5.1* 3.9   Liver Function Tests:  Recent Labs Lab 03/13/17 0557 03/14/17 0449 03/15/17 0455  ALBUMIN 2.4* 2.4* 2.2*   No results for  input(s): LIPASE, AMYLASE in the last 168 hours. CBC:  Recent Labs Lab 03/11/17 0842 03/12/17 0702 03/13/17 0557 03/14/17 0449 03/15/17 0455  WBC 28.5* 24.0* 23.4* 22.8* 24.9*  NEUTROABS 23.0* 20.2*  --  19.6*  --   HGB 9.1* 8.9* 8.1* 7.6* 7.5*  HCT 30.5* 29.5* 27.2* 25.7* 25.5*  MCV 97.4 96.7 97.5 96.6 98.1  PLT 377 380 363 334 359   Blood Culture    Component Value Date/Time   SDES BLOOD RIGHT HAND 03/11/2017 0846   SPECREQUEST  03/11/2017 0846    BOTTLES DRAWN AEROBIC AND ANAEROBIC Blood Culture adequate volume   CULT NO GROWTH 4 DAYS 03/11/2017 0846   REPTSTATUS PENDING 03/11/2017 0846    Cardiac Enzymes: No results for input(s): CKTOTAL, CKMB, CKMBINDEX, TROPONINI in the last 168 hours. CBG:  Recent Labs Lab 03/14/17 1242 03/14/17 2041 03/15/17 0012 03/15/17 0332 03/15/17 1049  GLUCAP 147* 125* 173* 139* 164*   Iron Studies: No results for input(s): IRON, TIBC, TRANSFERRIN, FERRITIN in the last 72 hours. _1 @ Studies/Results: No results found. Medications: . sodium chloride 10 mL/hr at 03/13/17 1832  . sodium chloride    . sodium  chloride    . sodium chloride    . sodium chloride     . calcium carbonate (dosed in mg elemental calcium)  250 mg of elemental calcium Oral TID  . carvedilol  12.5 mg Oral BID WC  . chlorhexidine gluconate (MEDLINE KIT)  15 mL Mouth Rinse BID  . darbepoetin (ARANESP) injection - DIALYSIS  200 mcg Intravenous Q Tue-HD  . feeding supplement (NEPRO CARB STEADY)  1,000 mL Per Tube Q24H  . feeding supplement (PRO-STAT SUGAR FREE 64)  60 mL Per Tube QID  . [name]'s butt cream   Topical QID  . heparin  5,000 Units Subcutaneous Q8H  . insulin aspart  0-15 Units Subcutaneous Q4H  . mouth rinse  15 mL Mouth Rinse QID  . multivitamin  1 tablet Oral QHS  . pantoprazole sodium  40 mg Per Tube Q24H  . sodium chloride flush  10-40 mL Intracatheter Q12H

## 2017-03-15 NOTE — Progress Notes (Addendum)
PROGRESS NOTE        PATIENT DETAILS Name: [name] Age: 36 y.o. Sex: female Date of Birth: 09-Apr-1981 Admit Date: 02/14/2017 Admitting Physician Lady Deutscher, MD WRU:[id number], Helene Kelp, FNP  Note-care assumed by me on 6/13  Brief Narrative: Patient is a 36 y.o. female with history of ESRD on HD, poorly controlled diabetes, history of bilateral transmetatarsal foot amputation with subsequent left foot wound dehiscence (refused BKA as outpatient), chronic systolic heart failure felt to be secondary to nonischemic cardiomyopathy admitted on 02/14/17 with wound dehiscence and persistent drainage from the left trans-metatarsal amputation site, she was admitted to the [facility]. She was subsequently found to be unresponsive in her room on 5/19 after getting IV Dilaudid-she was  in asystole, CPR was started, with ROSC in around 9 minutes.She was then transferred to the intensive care unit, and subsequently underwent cooling and then rewarming. She was unable to be liberated off the ventilator, and as a result underwent tracheostomy on 5/31. Patient has been evaluated by neurology during this hospital stay, per neurology chances of meaningful neurological recovery is dismal. Hospital course has now been complicated by persistent leukocytosis, respiratory failure, and wound dehiscence with gangrene causing persistent SIRS pathophysiology. 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 Squeezes my hands when asked to. Per RN-patient had a good day yesterday-and tolerated ATC well-placed back on ventilator support in the evening.  Assessment/Plan: Acute hypoxemic respiratory failure in a setting of cardiac arrest: Continue attempts to wean/liberate off ventilator, PCCM following-tracheostomy in place. If improvement continues, she is able to tolerate more weaning  trial, maybe able to hold off on placing a PEG tube. Note-IR tentatively planning a PEG tube sometime this week.  Cardiac arrest on 5/19 with anoxic brain injury: She seems to be slowly improving, following commands.  Note-Evaluated by neurology on 5/29-felt to have poor overall prognoses for any meaningful recovery.    Systemic inflammatory response syndrome: Continues to be intermittently febrile (low-grade), tachycardic-and has persistent leukocytosis. Likely secondary to left foot gangrene/infection and abscess-she is now status post AKA and still continues to have these symptoms. Blood cultures on 6/1, and 6/13 continued to be negative. Chest x-ray on 6/13 negative for pneumonia. C. difficile studies on 6/6 negative as well-RN does not report any significant diarrhea. She was on vancomycin and Tressie Ellis that was started on 6/14, and subsequently discontinued on 6/16 after AKA. Spoke with Dr Comer-ID on call, who has evaluated this patient in the past-he recommends that we resume her vancomycin and Fortaz-and plan on at least 3-5 day course from 6/15 when she had a AKA. Suspect that if fever continues, may need to consider central fever, and ID may need to be officially reconsulted.  Left trans-metatarsal stump wound dehiscence with infection and gangrene: Per operative note-pus/abscess noted extending up to the knee when BKA was attempted, subsequently underwent a AKA. Note, initially family was very reluctant to proceed with amputation, but given persistent leukocytosis/fever and potential risk of sepsis, family agreed for amputation, subsequently AKA was done on 6/15. See above regarding antimicrobial therapy.  ESRD: Nephrology following-HD on TTS.   Anemia: Likely secondary to chronic disease-probably worsened by acute illness. No signs of bleeding, follow CBC.   DM-2: CBGs stable-continue with SSI.   Chronic systolic heart failure/nonischemic  cardiomyopathy (EF 30-35% by TEE on 6/4): Volume  status remained stable-this is managed with dialysis-continue with Coreg.   Pericardial effusion: Seen on TEE-with no evidence of tamponade pathophysiology. Repeat echo at some point in the next few weeks.  Moderate protein calorie malnutrition: Continue NG tube feedings  Goals of care: Unfortunate 36 year old female with ESRD, cardiomyopathy, bilateral transmetatarsal amputations complicated by left stump dehiscence-suffered a cardiac arrest on admission-now with anoxic brain injury-ventilator dependent-tracheostomy in place, awaiting PEG tube placement next week. Hospital course has been prolonged-complicated by failure to wean from the ventilator, persistent systemic inflammatory response syndrome due to left foot gangrene/infection-after extensive discussion with family-she remains a full code, with family choosing to continue with full aggressive care. 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 Her prognosis is still very guarded at this time, will await attempts to see if she can be liberated off the ventilator. Family is aware of the risk of decompensation and deterioration.   Telemetry (independently reviewed): Sinus tachycardia  Studies: Lower extremity ultrasound 5/18 >> no evidence of DVT CT head 5/19 >> normal exam Echo 5/20 >> EF 30-35%, increased LVF. Diffuse hypokinesis, akinesis of basilar mid-inferior myocardium, grade 2 diastolic dysfxn  EEG 7/61 >> finding c/w mod to severe global cerebral dysfxn. C/w anoxic injury given clinical course  LE [place] 5/21 >> negative MRI brain 5/24 >> ?thrombosed cortical vein, otherwise normal TEE 6/4 >> mild MR, normal AV, mild TR, mild PR, EF 30-35%, diffuse hypokinesis, no thrombus, no PFO, normal RV, moderate pericardial effusion with synechia suggesting some chronicity, no vegetations   Echo (reviewed):EF 30-35% on TEE done on 6/4  Morning labs/Imaging ordered: yes  DVT Prophylaxis: Prophylactic Heparin   Code  Status: Full code   Family Communication: Mother at bedside  Disposition Plan: Remain inpatient in SDU  Antimicrobial agents: Anti-infectives    Start     Dose/Rate Route Frequency Ordered Stop   03/14/17 1200  vancomycin (VANCOCIN) IVPB 1000 mg/200 mL premix  Status:  Discontinued     1,000 mg 200 mL/hr over 60 Minutes Intravenous Every T-Th-Sa (Hemodialysis) 03/12/17 0912 03/14/17 1542   03/13/17 1430  ceFAZolin (ANCEF) IVPB 2g/100 mL premix  Status:  Discontinued     2 g 200 mL/hr over 30 Minutes Intravenous To ShortStay Surgical 03/12/17 1208 03/13/17 1113   03/13/17 1115  ceFAZolin (ANCEF) IVPB 2g/100 mL premix     2 g 200 mL/hr over 30 Minutes Intravenous To Surgery 03/13/17 1108 03/13/17 1506   03/12/17 1800  cefTAZidime (FORTAZ) 2 g in dextrose 5 % 50 mL IVPB  Status:  Discontinued     2 g 100 mL/hr over 30 Minutes Intravenous Every T-Th-Sa (1800) 03/12/17 0912 03/14/17 1542   03/12/17 1200  vancomycin (VANCOCIN) 1,500 mg in sodium chloride 0.9 % 250 mL IVPB     1,500 mg 250 mL/hr over 60 Minutes Intravenous Every Thu (Hemodialysis) 03/12/17 0912 03/12/17 1356   03/02/17 1200  cefTRIAXone (ROCEPHIN) 2 g in dextrose 5 % 50 mL IVPB     2 g 100 mL/hr over 30 Minutes Intravenous Every 24 hours 03/02/17 0810 03/06/17 1358   03/02/17 1200  vancomycin (VANCOCIN) IVPB 1000 mg/200 mL premix     1,000 mg 200 mL/hr over 60 Minutes Intravenous Every M-W-F (Hemodialysis) 03/02/17 0815 03/02/17 1457   03/02/17 1200  metroNIDAZOLE (FLAGYL) IVPB 500 mg     500 mg 100 mL/hr over 60 Minutes Intravenous Every 8 hours 03/02/17 0948 03/06/17 2230  02/26/17 1200  vancomycin (VANCOCIN) IVPB 1000 mg/200 mL premix     1,000 mg 200 mL/hr over 60 Minutes Intravenous Every T-Th-Sa (Hemodialysis) 02/25/17 1151 03/05/17 1325   02/25/17 1200  vancomycin (VANCOCIN) 2,000 mg in sodium chloride 0.9 % 500 mL IVPB     2,000 mg 250 mL/hr over 120 Minutes Intravenous  Once 02/25/17 1149 02/25/17 1449    02/25/17 0900  cefTRIAXone (ROCEPHIN) 2 g in dextrose 5 % 50 mL IVPB  Status:  Discontinued     2 g 100 mL/hr over 30 Minutes Intravenous Every 24 hours 02/25/17 0814 03/02/17 0810   02/25/17 0900  metroNIDAZOLE (FLAGYL) IVPB 500 mg  Status:  Discontinued     500 mg 100 mL/hr over 60 Minutes Intravenous Every 8 hours 02/25/17 0814 03/02/17 0948   02/20/17 1200  vancomycin (VANCOCIN) IVPB 1000 mg/200 mL premix  Status:  Discontinued     1,000 mg 200 mL/hr over 60 Minutes Intravenous Every M-W-F (Hemodialysis) 02/19/17 1506 02/20/17 1403   02/17/17 1800  meropenem (MERREM) 500 mg in sodium chloride 0.9 % 50 mL IVPB  Status:  Discontinued     500 mg 100 mL/hr over 30 Minutes Intravenous Daily-1800 02/17/17 1026 02/25/17 0814   02/17/17 1200  vancomycin (VANCOCIN) IVPB 1000 mg/200 mL premix     1,000 mg 200 mL/hr over 60 Minutes Intravenous Every T-Th-Sa (Hemodialysis) 02/14/17 1758 02/19/17 1612   02/14/17 2200  clindamycin (CLEOCIN) IVPB 900 mg  Status:  Discontinued     900 mg 100 mL/hr over 30 Minutes Intravenous Every 8 hours 02/14/17 1106 02/14/17 1928   02/14/17 2000  clindamycin (CLEOCIN) IVPB 900 mg  Status:  Discontinued     900 mg 100 mL/hr over 30 Minutes Intravenous Every 8 hours 02/14/17 1928 02/16/17 1047   02/14/17 1915  piperacillin-tazobactam (ZOSYN) IVPB 3.375 g  Status:  Discontinued     3.375 g 100 mL/hr over 30 Minutes Intravenous Every 12 hours 02/14/17 1801 02/17/17 1026   02/14/17 1845  vancomycin (VANCOCIN) 2,000 mg in sodium chloride 0.9 % 500 mL IVPB     2,000 mg 250 mL/hr over 120 Minutes Intravenous  Once 02/14/17 1758 02/14/17 2107   02/14/17 1115  clindamycin (CLEOCIN) IVPB 900 mg  Status:  Discontinued     900 mg 100 mL/hr over 30 Minutes Intravenous  Once 02/14/17 1106 02/14/17 2236   02/14/17 0945  clindamycin (CLEOCIN) IVPB 600 mg     600 mg 100 mL/hr over 30 Minutes Intravenous  Once 02/14/17 0931 02/14/17 1013      Procedures: ETT 5/19 >>  5/31 Lt IJ CVL 5/19 >> out Trach 5/31 >>  TEE 6/4 >> mild MR, normal AV, mild TR, mild PR, EF 30-35%, diffuse hypokinesis, no thrombus, no PFO, normal RV, moderate pericardial effusion with synechia suggesting some chronicity, no vegetations  Rt IJ TLC 6/13>> Left AKA 6/15>>  CONSULTS:  Infectious disease:  Neurology  Gastroenterology  Wound care  PCCM  Nephrology  Orthopedics  Time spent: 30 minutes-Greater than 50% of this time was spent in counseling, explanation of diagnosis, planning of further management, and coordination of care.  MEDICATIONS: Scheduled Meds: . calcium carbonate (dosed in mg elemental calcium)  250 mg of elemental calcium Oral TID  . carvedilol  12.5 mg Oral BID WC  . chlorhexidine gluconate (MEDLINE KIT)  15 mL Mouth Rinse BID  . darbepoetin (ARANESP) injection - DIALYSIS  200 mcg Intravenous Q Tue-HD  . feeding supplement (NEPRO CARB STEADY)  1,000 mL Per Tube Q24H  . feeding supplement (PRO-STAT SUGAR FREE 64)  60 mL Per Tube QID  . [name]'s butt cream   Topical QID  . heparin  5,000 Units Subcutaneous Q8H  . insulin aspart  0-15 Units Subcutaneous Q4H  . mouth rinse  15 mL Mouth Rinse QID  . multivitamin  1 tablet Oral QHS  . pantoprazole sodium  40 mg Per Tube Q24H  . sodium chloride flush  10-40 mL Intracatheter Q12H   Continuous Infusions: . sodium chloride 10 mL/hr at 03/13/17 1832  . sodium chloride    . sodium chloride    . sodium chloride    . sodium chloride     PRN Meds:.sodium chloride, sodium chloride, sodium chloride, sodium chloride, acetaminophen (TYLENOL) oral liquid 160 mg/5 mL, acetaminophen **OR** acetaminophen, albuterol, alteplase, fentaNYL (SUBLIMAZE) injection, heparin, heparin, HYDROmorphone (DILAUDID) injection, lidocaine (PF), lidocaine-prilocaine, metoCLOPramide **OR** metoCLOPramide (REGLAN) injection, metoprolol tartrate, ondansetron **OR** ondansetron (ZOFRAN) IV, pentafluoroprop-tetrafluoroeth, sodium  chloride flush   PHYSICAL EXAM: Vital signs: Vitals:   03/15/17 0400 03/15/17 0459 03/15/17 0806 03/15/17 1046  BP: 96/62   106/65  Pulse: (!) 117   (!) 113  Resp: 16     Temp: (!) 100.9 F (38.3 C)     TempSrc: Oral     SpO2: 100%  98%   Weight:  85.3 kg (188 lb)    Height:       Filed Weights   03/14/17 0740 03/14/17 1200 03/15/17 0459  Weight: 83.9 kg (185 lb) 83.9 kg (185 lb) 85.3 kg (188 lb)   Body mass index is 28.17 kg/m.  General appearance:Sleeping when I walked in-easily arouses, squeeze my hands. Eyes: No scleral icterus HEENT: Atraumatic and Normocephalic Neck: supple, tracheostomy in place Resp:Good air entry bilaterally, some transmitted upper airway sounds CVS: S1 S2 regular, tachycardic GI: Bowel sounds present, Non tender and not distended with no gaurding, rigidity or rebound. Extremities: Left AKA stump-vac in place. Musculoskeletal:No digital cyanosis  I have personally reviewed following labs and imaging studies  LABORATORY DATA: CBC:  Recent Labs Lab 03/11/17 0842 03/12/17 0702 03/13/17 0557 03/14/17 0449 03/15/17 0455  WBC 28.5* 24.0* 23.4* 22.8* 24.9*  NEUTROABS 23.0* 20.2*  --  19.6*  --   HGB 9.1* 8.9* 8.1* 7.6* 7.5*  HCT 30.5* 29.5* 27.2* 25.7* 25.5*  MCV 97.4 96.7 97.5 96.6 98.1  PLT 377 380 363 334 314    Basic Metabolic Panel:  Recent Labs Lab 03/10/17 0722 03/11/17 0842 03/13/17 0557 03/14/17 0449 03/15/17 0455  NA 142 140 136 136 135  K 3.2* 4.0 4.1 4.4 3.5  CL 101 98* 96* 97* 95*  CO2 23 27 26 23 27   GLUCOSE 173* 175* 172* 146* 157*  BUN 131* 74* 45* 63* 29*  CREATININE 6.92* 4.65* 3.78* 5.26* 3.17*  CALCIUM 9.6 9.3 9.0 8.6* 8.3*  PHOS 4.4 3.4 3.0 5.1* 3.9    GFR: Estimated Creatinine Clearance: 28.1 mL/min (A) (by C-G formula based on SCr of 3.17 mg/dL (H)).  Liver Function Tests:  Recent Labs Lab 03/10/17 0722 03/11/17 0842 03/13/17 0557 03/14/17 0449 03/15/17 0455  ALBUMIN 2.2* 2.5* 2.4* 2.4* 2.2*    No results for input(s): LIPASE, AMYLASE in the last 168 hours. No results for input(s): AMMONIA in the last 168 hours.  Coagulation Profile: No results for input(s): INR, PROTIME in the last 168 hours.  Cardiac Enzymes: No results for input(s): CKTOTAL, CKMB, CKMBINDEX, TROPONINI in the last 168 hours.  BNP (last 3  results) No results for input(s): PROBNP in the last 8760 hours.  HbA1C: No results for input(s): HGBA1C in the last 72 hours.  CBG:  Recent Labs Lab 03/14/17 1242 03/14/17 2041 03/15/17 0012 03/15/17 0332 03/15/17 1049  GLUCAP 147* 125* 173* 139* 164*    Lipid Profile: No results for input(s): CHOL, HDL, LDLCALC, TRIG, CHOLHDL, LDLDIRECT in the last 72 hours.  Thyroid Function Tests: No results for input(s): TSH, T4TOTAL, FREET4, T3FREE, THYROIDAB in the last 72 hours.  Anemia Panel: No results for input(s): VITAMINB12, FOLATE, FERRITIN, TIBC, IRON, RETICCTPCT in the last 72 hours.  Urine analysis:    Component Value Date/Time   COLORURINE AMBER (A) 02/14/2017 2111   APPEARANCEUR HAZY (A) 02/14/2017 2111   LABSPEC 1.015 02/14/2017 2111   PHURINE 5.0 02/14/2017 2111   GLUCOSEU NEGATIVE 02/14/2017 2111   HGBUR SMALL (A) 02/14/2017 2111   BILIRUBINUR NEGATIVE 02/14/2017 2111   KETONESUR NEGATIVE 02/14/2017 2111   PROTEINUR 30 (A) 02/14/2017 2111   UROBILINOGEN 0.2 09/21/2014 1908   NITRITE NEGATIVE 02/14/2017 2111   LEUKOCYTESUR TRACE (A) 02/14/2017 2111    Sepsis Labs: Lactic Acid, Venous    Component Value Date/Time   LATICACIDVEN 4.6 (HH) 02/14/2017 1807    MICROBIOLOGY: Recent Results (from the past 240 hour(s))  Culture, blood (routine x 2)     Status: None (Preliminary result)   Collection Time: 03/11/17  8:42 AM  Result Value Ref Range Status   Specimen Description BLOOD RIGHT HAND  Final   Special Requests   Final    BOTTLES DRAWN AEROBIC ONLY Blood Culture adequate volume   Culture NO GROWTH 4 DAYS  Final   Report Status  PENDING  Incomplete  Culture, blood (routine x 2)     Status: None (Preliminary result)   Collection Time: 03/11/17  8:46 AM  Result Value Ref Range Status   Specimen Description BLOOD RIGHT HAND  Final   Special Requests   Final    BOTTLES DRAWN AEROBIC AND ANAEROBIC Blood Culture adequate volume   Culture NO GROWTH 4 DAYS  Final   Report Status PENDING  Incomplete    RADIOLOGY STUDIES/RESULTS: Ct Abdomen Wo Contrast  Result Date: 03/02/2017 CLINICAL DATA:  Preop for gastrostomy to EXAM: CT ABDOMEN WITHOUT CONTRAST TECHNIQUE: Multidetector CT imaging of the abdomen was performed following the standard protocol without IV contrast. COMPARISON:  None. FINDINGS: Lower chest: Bibasilar dependent and medial consolidation. Large pericardial effusion. Hepatobiliary: The left lobe of the liver is prominent and anterior to the stoma. The gallbladder is not clearly visualized. It may either be decompressed or absent due to cholecystectomy. Pancreas: Unremarkable Spleen: Unremarkable Adrenals/Urinary Tract: Kidneys and adrenal glands are within normal limits. Stomach/Bowel: The stomach is positioned deep to the liver and colon. Gastrostomy tube placement may be problematic. No evidence of small-bowel obstruction. Feeding tube tip is in the proximal duodenum. Vascular/Lymphatic: Small para-aortic lymph nodes. Atherosclerotic vascular calcifications are noted. No evidence of aortic aneurysm. Other: No free-fluid. Musculoskeletal: No vertebral compression deformity. IMPRESSION: The stomach is positioned deep to a prominent left lobe of the liver as well as the transverse colon. Gas is distension of the stomach and barium opacification of the transverse colon will be essential during the procedure. Bibasilar pulmonary consolidation. Large pericardial effusion. Electronically Signed   By: Marybelle Killings M.D.   On: 03/02/2017 07:18   Dg Chest 2 View  Result Date: 02/14/2017 CLINICAL DATA:  Hypoxia. Pt came to ED with  left foot pain post amputation  of toes. Hx diabetes, PNA, COPD, HTN, MI, CAD, CKD, CHF. EXAM: CHEST  2 VIEW COMPARISON:  04/22/2016 FINDINGS: Moderate enlargement of the cardiopericardial silhouette, stable. No mediastinal or hilar masses. No evidence of adenopathy. Clear lungs.  No pleural effusion.  No pneumothorax. Skeletal structures are unremarkable. IMPRESSION: 1. No acute cardiopulmonary disease. 2. Stable moderate cardiomegaly. Electronically Signed   By: Lajean Manes M.D.   On: 02/14/2017 09:26   Ct Head Wo Contrast  Result Date: 02/14/2017 CLINICAL DATA:  Cardiac arrest and, EXAM: CT HEAD WITHOUT CONTRAST TECHNIQUE: Contiguous axial images were obtained from the base of the skull through the vertex without intravenous contrast. COMPARISON:  None. FINDINGS: Brain: No mass lesion, intraparenchymal hemorrhage or extra-axial collection. No evidence of acute cortical infarct. Brain parenchyma and CSF-containing spaces are normal for age. Mildly asymmetric hypoattenuation of the right temporal lobe relative to the left is likely artifactual Vascular: No hyperdense vessel or unexpected calcification. Skull: Normal visualized skull base, calvarium and extracranial soft tissues. Sinuses/Orbits: No sinus fluid levels or advanced mucosal thickening. No mastoid effusion. Normal orbits. IMPRESSION: Normal head CT. Electronically Signed   By: Ulyses Jarred M.D.   On: 02/14/2017 22:23   Mr Brain Wo Contrast  Result Date: 02/18/2017 CLINICAL DATA:  Anoxic brain injury status post cardiac arrest EXAM: MRI HEAD WITHOUT CONTRAST TECHNIQUE: Multiplanar, multiecho pulse sequences of the brain and surrounding structures were obtained without intravenous contrast. IV contrast could not be administered due to the patient's poor renal function. COMPARISON:  Head CT 02/14/2017 FINDINGS: Brain: The midline structures are normal. There is no focal diffusion restriction to indicate acute infarct. The brain parenchymal signal  is normal and there is no mass lesion. There is a focal susceptibility abnormality at the left insula (series 12, image 55). Brain volume is normal for age without age-advanced or lobar predominant atrophy. The dura is normal and there is no extra-axial collection. Vascular: Major intracranial arterial and venous sinus flow voids are preserved. Skull and upper cervical spine: The visualized skull base, calvarium, upper cervical spine and extracranial soft tissues are normal. Sinuses/Orbits: No fluid levels or advanced mucosal thickening. No mastoid effusion. Normal orbits. IMPRESSION: 1. Focus of susceptibility in the left insula may indicate a thrombosed cortical vein. The location does not correspond to a large artery. 2. No associated ischemia or hemorrhage.  Otherwise normal brain. Electronically Signed   By: Ulyses Jarred M.D.   On: 02/18/2017 14:55   Ir Fluoro Guide Cv Line Right  Result Date: 03/11/2017 INDICATION: Osteomyelitis EXAM: TUNNELED RIGHT JUGULAR PICC LINE PLACEMENT WITH ULTRASOUND AND FLUOROSCOPIC GUIDANCE MEDICATIONS: None. ANESTHESIA/SEDATION: None FLUOROSCOPY TIME:  Fluoroscopy Time:  minutes 30 seconds (1 mGy). COMPLICATIONS: None immediate. PROCEDURE: The patient was advised of the possible risks and complications and agreed to undergo the procedure. The patient was then brought to the angiographic suite for the procedure. The right neck was prepped with chlorhexidine, draped in the usual sterile fashion using maximum barrier technique (cap and mask, sterile gown, sterile gloves, large sterile sheet, hand hygiene and cutaneous antiseptic). Local anesthesia was attained by infiltration with 1% lidocaine. Ultrasound demonstrated patency of the right jugular vein, and this was documented with an image. Under real-time ultrasound guidance, this vein was accessed with a 21 gauge micropuncture needle and image documentation was performed. A long subcutaneous tract was employed. The needle was  exchanged over a guidewire for a peel-away sheath through which a 26 cm 5 [place] double lumen power injectable PICC was advanced,  and positioned with its tip at the lower SVC/right atrial junction. The cuff was positioned in the subcutaneous tract. Fluoroscopy during the procedure and fluoro spot radiograph confirms appropriate catheter position. The catheter was flushed, secured to the skin with Prolene sutures, and covered with a sterile dressing. IMPRESSION: Successful placement of a tunneled right jugular PICC with sonographic and fluoroscopic guidance. The catheter is ready for use. Electronically Signed   By: Marybelle Killings M.D.   On: 03/11/2017 15:31   Ir US Guide Vasc Access Right  Result Date: 03/11/2017 INDICATION: Osteomyelitis EXAM: TUNNELED RIGHT JUGULAR PICC LINE PLACEMENT WITH ULTRASOUND AND FLUOROSCOPIC GUIDANCE MEDICATIONS: None. ANESTHESIA/SEDATION: None FLUOROSCOPY TIME:  Fluoroscopy Time:  minutes 30 seconds (1 mGy). COMPLICATIONS: None immediate. PROCEDURE: The patient was advised of the possible risks and complications and agreed to undergo the procedure. The patient was then brought to the angiographic suite for the procedure. The right neck was prepped with chlorhexidine, draped in the usual sterile fashion using maximum barrier technique (cap and mask, sterile gown, sterile gloves, large sterile sheet, hand hygiene and cutaneous antiseptic). Local anesthesia was attained by infiltration with 1% lidocaine. Ultrasound demonstrated patency of the right jugular vein, and this was documented with an image. Under real-time ultrasound guidance, this vein was accessed with a 21 gauge micropuncture needle and image documentation was performed. A long subcutaneous tract was employed. The needle was exchanged over a guidewire for a peel-away sheath through which a 26 cm 5 [place] double lumen power injectable PICC was advanced, and positioned with its tip at the lower SVC/right atrial junction. The  cuff was positioned in the subcutaneous tract. Fluoroscopy during the procedure and fluoro spot radiograph confirms appropriate catheter position. The catheter was flushed, secured to the skin with Prolene sutures, and covered with a sterile dressing. IMPRESSION: Successful placement of a tunneled right jugular PICC with sonographic and fluoroscopic guidance. The catheter is ready for use. Electronically Signed   By: Marybelle Killings M.D.   On: 03/11/2017 15:31   Dg Chest Port 1 View  Result Date: 03/11/2017 CLINICAL DATA:  Leukocytosis. EXAM: PORTABLE CHEST 1 VIEW COMPARISON:  03/03/2017 FINDINGS: Patient is rotated to the left. The cardio pericardial silhouette is enlarged. Slight improvement in left base aeration. Tracheostomy tube remains in place. A feeding tube passes into the stomach although the distal tip position is not included on the film. The visualized bony structures of the thorax are intact. Telemetry leads overlie the chest. IMPRESSION: Persistent enlargement of the cardiopericardial silhouette without evidence for overt pulmonary edema or substantial pleural effusion. Electronically Signed   By: Misty Stanley M.D.   On: 03/11/2017 08:37   Dg Chest Port 1 View  Result Date: 03/03/2017 CLINICAL DATA:  Respiratory failure. EXAM: PORTABLE CHEST 1 VIEW COMPARISON:  03/01/2017 . FINDINGS: Tracheostomy tube and feeding tube in stable position. Cardiomegaly with mild diffuse interstitial prominence suggesting mild CHF. Persistent atelectasis and consolidation left lower lobe. Small left pleural effusion cannot be excluded. No pneumothorax. IMPRESSION: 1. Tracheostomy tube and feeding tube in stable position. 2. Cardiomegaly with diffuse mild interstitial prominence suggesting mild CHF. 3. Persistent left lower lobe atelectasis and consolidation. Small left pleural effusion cannot be excluded . Electronically Signed   By: Marcello Moores  Register   On: 03/03/2017 07:09   Dg Chest Port 1 View  Result Date:  03/01/2017 CLINICAL DATA:  Respiratory failure EXAM: PORTABLE CHEST 1 VIEW COMPARISON:  02/27/2017 FINDINGS: 0546 hours. Patient rotated to the left. Tracheostomy tube remains in place.  A feeding tube passes into the stomach although the distal tip position is not included on the film. The cardio pericardial silhouette is enlarged. Left base collapse/consolidation again noted. There is pulmonary vascular congestion without overt pulmonary edema. IMPRESSION: Rotated film with cardiomegaly and vascular congestion. Persistent left base collapse/ consolidation. Electronically Signed   By: Misty Stanley M.D.   On: 03/01/2017 07:43   Dg Chest Port 1 View  Result Date: 02/27/2017 CLINICAL DATA:  Hypoxia. EXAM: PORTABLE CHEST 1 VIEW COMPARISON:  02/26/2017. FINDINGS: Tracheostomy to in stable position. Stable cardiomegaly. Low lung volumes. Progressive left lower lobe atelectasis and infiltrate. Small left pleural effusion cannot be excluded . IMPRESSION: 1. Tracheostomy tube in stable position. 2. Progressive left lower lobe atelectasis and infiltrate. Small left pleural effusion cannot be excluded . Electronically Signed   By: Marcello Moores  Register   On: 02/27/2017 07:01   Dg Chest Port 1 View  Result Date: 02/26/2017 CLINICAL DATA:  Status post tracheostomy placement. EXAM: PORTABLE CHEST 1 VIEW COMPARISON:  Earlier today. FINDINGS: The endotracheal tube has been removed and replaced with a tracheostomy tube in satisfactory position. The nasogastric tube and esophageal probe have been removed. The left jugular catheter has been removed. No pneumothorax. Grossly stable enlarged cardiac silhouette. Left lower lobe opacity with improvement laterally since 02/25/2017. Clear right lung. Unremarkable bones. IMPRESSION: 1. Tracheostomy tube in satisfactory position. 2. Left lower lobe atelectasis or pneumonia with mild improvement since 2 days ago. 3. Stable cardiomegaly. Electronically Signed   By: Claudie Revering M.D.   On:  02/26/2017 16:02   Dg Chest Port 1 View  Result Date: 02/26/2017 CLINICAL DATA:  Hypoxia.  Shortness of breath. EXAM: PORTABLE CHEST 1 VIEW COMPARISON:  02/25/2017. FINDINGS: Endotracheal tube, NG tube, left IJ line esophageal probe in stable position. Cardiomegaly with bilateral pulmonary interstitial infiltrates consistent with CHF. Left base atelectasis . Small left pleural effusion. Similar findings noted on prior exam. No pneumothorax. IMPRESSION: 1. Lines and tubes in stable position. 2. Cardiomegaly with bilateral pulmonary interstitial prominence and small left pleural effusion noted consistent CHF. Left base atelectasis. Similar findings noted on prior exam. Electronically Signed   By: [place]   On: 02/26/2017 07:18   Dg Chest Port 1 View  Result Date: 02/25/2017 CLINICAL DATA:  Respiratory failure, intubated patient. Diabetes, cardiomyopathy, morbid obesity, end-stage renal disease. EXAM: PORTABLE CHEST 1 VIEW COMPARISON:  Portable chest x-ray of Feb 24, 2017 FINDINGS: The lungs are adequately inflated. The pulmonary interstitial markings are slightly less prominent today. The pulmonary vascularity remains engorged. The cardiac silhouette remains enlarged. Retrocardiac region on the left is slightly less dense. The endotracheal tube tip lies 5.7 cm above the carina. The esophagogastric tube tip projects below the inferior margin of the image. The left internal jugular venous catheter tip projects over the proximal SVC. IMPRESSION: Slight interval improvement in pulmonary interstitial edema. Stable cardiomegaly. Persistent left lower lobe atelectasis or pneumonia. The support tubes are in reasonable position. Electronically Signed   By: David  Martinique M.D.   On: 02/25/2017 07:57   Dg Chest Port 1 View  Result Date: 02/24/2017 CLINICAL DATA:  Respiratory failure EXAM: PORTABLE CHEST 1 VIEW COMPARISON:  Two days ago FINDINGS: Endotracheal tube tip at the clavicular heads. An orogastric  tube reaches the stomach. Esophageal thermistor. Left IJ central line with tip at the SVC level. Cardiomegaly and diffuse hazy opacity with cephalized blood flow. No definitive effusion. No pneumothorax. IMPRESSION: 1. Stable positioning of tubes and central line.  2. CHF pattern. Electronically Signed   By: Monte Fantasia M.D.   On: 02/24/2017 07:31   Dg Chest Port 1 View  Result Date: 02/22/2017 CLINICAL DATA:  Respiratory failure EXAM: PORTABLE CHEST 1 VIEW COMPARISON:  02/21/2017 FINDINGS: Endotracheal tube terminates 5 cm above the carina. Cardiomegaly with mild interstitial edema. Retrocardiac opacity, possibly reflecting a combination of atelectasis and pleural effusion, pneumonia not excluded. No pneumothorax. Left IJ venous catheter terminates at the cavoatrial junction. Enteric tube courses into the stomach. IMPRESSION: Endotracheal tube terminates 5 cm above the carina. Cardiomegaly with mild interstitial edema. Retrocardiac opacity, possibly atelectasis and pleural effusion, pneumonia not excluded. Electronically Signed   By: Julian Hy M.D.   On: 02/22/2017 07:30   Dg Chest Port 1 View  Result Date: 02/21/2017 CLINICAL DATA:  Endotracheal tube. EXAM: PORTABLE CHEST 1 VIEW COMPARISON:  02/19/2017 FINDINGS: Endotracheal tube terminates approximately 5 cm above the carina. Enteric tube courses into the left upper abdomen with tip not imaged. Left jugular catheter terminates over the mid SVC. The cardiac silhouette remains enlarged. Pulmonary vascular congestion and bilateral parenchymal lung opacities have not significantly changed. No large pleural effusion or pneumothorax is identified. IMPRESSION: Unchanged pulmonary edema. Electronically Signed   By: Logan Bores M.D.   On: 02/21/2017 07:42   Dg Chest Port 1 View  Result Date: 02/19/2017 CLINICAL DATA:  36 year old female with respiratory failure. EXAM: PORTABLE CHEST 1 VIEW COMPARISON:  02/18/2017 and prior radiograph FINDINGS: An  endotracheal tube with tip 3.5 cm above the carina, left IJ central venous catheter with tip overlying the lower SVC, and NG tube entering the stomach with tip off the field of view again noted. Pulmonary edema has decreased from the prior study. Continued left lower lung atelectasis/ consolidation again noted. There is no evidence of pneumothorax. No other changes are noted. IMPRESSION: Decreased pulmonary edema, otherwise unchanged appearance of the chest. Electronically Signed   By: Margarette Canada M.D.   On: 02/19/2017 08:03   Dg Chest Port 1 View  Result Date: 02/18/2017 CLINICAL DATA:  Respiratory failure EXAM: PORTABLE CHEST 1 VIEW COMPARISON:  02/17/2017 FINDINGS: Cardiac shadow remains enlarged. Endotracheal tube, nasogastric catheter and esophageal probe are again seen and stable. Left jugular central line is again noted and stable. No pneumothorax is seen. Diffuse vascular congestion and increased parenchymal opacity is noted consistent with progressive CHF. No focal confluent infiltrate is noted. IMPRESSION: Progressive changes of CHF. Electronically Signed   By: Inez Catalina M.D.   On: 02/18/2017 08:15   Dg Chest Port 1 View  Result Date: 02/17/2017 CLINICAL DATA:  Pulmonary edema EXAM: PORTABLE CHEST 1 VIEW COMPARISON:  02/16/2017 FINDINGS: Endotracheal tube, NG tube, left jugular central venous catheter are stable. Severe cardiomegaly is stable. Mild vascular congestion is stable. No consolidation. No interstitial edema. IMPRESSION: Stable cardiomegaly and mild vascular congestion. Electronically Signed   By: Marybelle Killings M.D.   On: 02/17/2017 07:35   Dg Chest Port 1 View  Result Date: 02/16/2017 CLINICAL DATA:  Acute respiratory failure. EXAM: PORTABLE CHEST 1 VIEW COMPARISON:  Radiograph of Feb 14, 2017. FINDINGS: Stable cardiomegaly. Endotracheal and nasogastric tubes are unchanged in position. Left internal jugular catheter is unchanged with distal tip in expected position of SVC. No  pneumothorax is noted. Lungs are clear. Bony thorax is unremarkable. IMPRESSION: Stable support apparatus.  Stable cardiomegaly. Electronically Signed   By: Marijo Conception, M.D.   On: 02/16/2017 07:30   Dg Chest [facility] 1 View  Result  Date: 02/14/2017 CLINICAL DATA:  Central line placement EXAM: PORTABLE CHEST 1 VIEW COMPARISON:  02/14/2017 at 0913 hours FINDINGS: Endotracheal tube terminates 4.5 cm above the carina. Lungs are clear.  No pleural effusion or pneumothorax. Cardiomegaly. Left IJ venous catheter terminates at cavoatrial junction. Enteric tube courses into the stomach. Defibrillator pads overlying the left hemithorax. IMPRESSION: Endotracheal tube terminates 4.5 cm above the carina. Left IJ venous catheter terminates at the cavoatrial junction. Electronically Signed   By: Julian Hy M.D.   On: 02/14/2017 18:31   Dg Abd Portable 1v  Result Date: 03/02/2017 CLINICAL DATA:  Encounter for feeding tube placement EXAM: PORTABLE ABDOMEN - 1 VIEW COMPARISON:  Portable exam 1712 hours compared to CT abdomen and pelvis of 03/01/2017 FINDINGS: Feeding tube traverses abdomen with tip projecting over distal antrum near pylorus. Cardiac silhouette appears enlarged. Visualized bowel gas pattern normal. Osseous structures unremarkable. IMPRESSION: Tip of feeding tube projects over distal gastric antrum near pylorus. Electronically Signed   By: Lavonia Dana M.D.   On: 03/02/2017 17:21   Dg Abd Portable 1v  Result Date: 02/27/2017 CLINICAL DATA:  Feeding tube placement EXAM: PORTABLE ABDOMEN - 1 VIEW COMPARISON:  None. FINDINGS: Feeding tube with the tip projecting over the antrum of the stomach. There is no bowel dilatation to suggest obstruction. There is no evidence of pneumoperitoneum, portal venous gas or pneumatosis. There are no pathologic calcifications along the expected course of the ureters. The osseous structures are unremarkable. IMPRESSION: Feeding tube with the tip projecting over the antrum  of the stomach. Electronically Signed   By: Kathreen Devoid   On: 02/27/2017 11:05   Dg Foot Complete Left  Result Date: 02/14/2017 CLINICAL DATA:  Pt c/o severe pain and inflammation in left foot. 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 Weeping evident at site of amputation. Pt reports nausea today. Hx diabetes, HTN, CKD. EXAM: LEFT FOOT - COMPLETE 3+ VIEW COMPARISON:  12/29/2016 FINDINGS: Postop changes including resection of the proximal first and fourth metatarsals since prior exam. Small proximal fragments of the second third and fifth metatarsals are identified. There is some indistinctness of the resection margin of the third and fifth metatarsal fragments suggesting osteomyelitis. There is a soft tissue defect distal to the resection margin seen best on the lateral projection. IMPRESSION: 1. Interval postop changes. Indistinctness of residual third and fifth metatarsal fragments, suggesting osteomyelitis. Electronically Signed   By: Lucrezia Europe M.D.   On: 02/14/2017 08:15   US Abdomen Limited Ruq  Result Date: 02/18/2017 CLINICAL DATA:  Elevated liver function studies. EXAM: US ABDOMEN LIMITED - RIGHT UPPER QUADRANT COMPARISON:  None. FINDINGS: Gallbladder: No gallstones or wall thickening visualized. No sonographic Murphy sign noted by sonographer. Common bile duct: Diameter: 7 mm, normal Liver: Circumscribed hyperechoic lesion with central hypoechoic appearance demonstrated along the dome of the liver measuring 2.8 cm maximal diameter. This is likely to represent a cavernous hemangioma, but the appearance on ultrasound is somewhat atypical. Consider follow-up with elective MRI of the liver for better characterization. IMPRESSION: No evidence of cholelithiasis or cholecystitis. Circumscribed hyperechoic lesion in the dome of the liver measuring 2.8 cm maximal diameter. This is probably a cavernous hemangioma but appearance is somewhat atypical. Suggest elective MRI liver in follow-up for  better characterization. Electronically Signed   By: Lucienne Capers M.D.   On: 02/18/2017 02:10     LOS: 29 days   Oren Binet, MD  [facility] Pager:[phone]  If 7PM-7AM, please contact night-coverage [email or web address]  Password [email or web address] 03/15/2017, 11:09 AM

## 2017-03-16 DIAGNOSIS — I5041 Acute combined systolic (congestive) and diastolic (congestive) heart failure: Secondary | ICD-10-CM

## 2017-03-16 DIAGNOSIS — R0902 Hypoxemia: Secondary | ICD-10-CM

## 2017-03-16 LAB — CBC
HEMATOCRIT: 25.4 % — AB (ref 36.0–46.0)
HEMOGLOBIN: 7.6 g/dL — AB (ref 12.0–15.0)
MCH: 28.6 pg (ref 26.0–34.0)
MCHC: 29.9 g/dL — AB (ref 30.0–36.0)
MCV: 95.5 fL (ref 78.0–100.0)
Platelets: 371 10*3/uL (ref 150–400)
RBC: 2.66 MIL/uL — ABNORMAL LOW (ref 3.87–5.11)
RDW: 17.6 % — ABNORMAL HIGH (ref 11.5–15.5)
WBC: 23.3 10*3/uL — ABNORMAL HIGH (ref 4.0–10.5)

## 2017-03-16 LAB — GLUCOSE, CAPILLARY
GLUCOSE-CAPILLARY: 125 mg/dL — AB (ref 65–99)
GLUCOSE-CAPILLARY: 140 mg/dL — AB (ref 65–99)
GLUCOSE-CAPILLARY: 135 mg/dL — AB (ref 65–99)
Glucose-Capillary: 173 mg/dL — ABNORMAL HIGH (ref 65–99)
Glucose-Capillary: 137 mg/dL — ABNORMAL HIGH (ref 65–99)
Glucose-Capillary: 156 mg/dL — ABNORMAL HIGH (ref 65–99)

## 2017-03-16 LAB — CULTURE, BLOOD (ROUTINE X 2)
CULTURE: NO GROWTH
Culture: NO GROWTH
Special Requests: ADEQUATE
Special Requests: ADEQUATE

## 2017-03-16 NOTE — Progress Notes (Signed)
[facility] ROUNDING NOTE   Subjective:   Interval History:  36 y.o. female who presents with radiologic evidence of osteomyelitis with wound dehiscence and nonhealing. The patient is having drainage of mucopurulent drainage from the wound. Patient will be admitted for BKA  6/15   Objective:  Vital signs in last 24 hours:  Temp:  [98.2 F (36.8 C)-102 F (38.9 C)] 98.2 F (36.8 C) (06/18 0800) Pulse Rate:  [107-120] 109 (06/18 0821) Resp:  [17-36] 17 (06/18 0821) BP: (93-130)/(65-81) 130/81 (06/18 0800) SpO2:  [95 %-100 %] 100 % (06/18 0821) FiO2 (%):  [35 %] 35 % (06/18 0821) Weight:  [194 lb (88 kg)] 194 lb (88 kg) (06/18 0338)  Weight change: 9 lb (4.082 kg) Filed Weights   03/14/17 1200 03/15/17 0459 03/16/17 0338  Weight: 185 lb (83.9 kg) 188 lb (85.3 kg) 194 lb (88 kg)    Intake/Output: I/O last 3 completed shifts: In: 886.4 [I.V.:365.7; NG/GT:520.7] Out: 140 [Drains:40; Stool:100]   Intake/Output this shift:  No intake/output data recorded.  CVS- RRR RS- CTA ABD- BS present soft non-distended EXT- no edema  LUA AVF + bruit    Basic Metabolic Panel:  Recent Labs Lab 03/10/17 0722 03/11/17 0842 03/13/17 0557 03/14/17 0449 03/15/17 0455  NA 142 140 136 136 135  K 3.2* 4.0 4.1 4.4 3.5  CL 101 98* 96* 97* 95*  CO2 _0 GLUCOSE 173* 175* 172* 146* 157*  BUN 131* 74* 45* 63* 29*  CREATININE 6.92* 4.65* 3.78* 5.26* 3.17*  CALCIUM 9.6 9.3 9.0 8.6* 8.3*  PHOS 4.4 3.4 3.0 5.1* 3.9    Liver Function Tests:  Recent Labs Lab 03/10/17 0722 03/11/17 0842 03/13/17 0557 03/14/17 0449 03/15/17 0455  ALBUMIN 2.2* 2.5* 2.4* 2.4* 2.2*   No results for input(s): LIPASE, AMYLASE in the last 168 hours. No results for input(s): AMMONIA in the last 168 hours.  CBC:  Recent Labs Lab 03/11/17 0842 03/12/17 0702 03/13/17 0557 03/14/17 0449 03/15/17 0455 03/16/17 0444  WBC 28.5* 24.0* 23.4* 22.8* 24.9* 23.3*  NEUTROABS 23.0* 20.2*   --  19.6*  --   --   HGB 9.1* 8.9* 8.1* 7.6* 7.5* 7.6*  HCT 30.5* 29.5* 27.2* 25.7* 25.5* 25.4*  MCV 97.4 96.7 97.5 96.6 98.1 95.5  PLT 377 380 363 334 359 371    Cardiac Enzymes: No results for input(s): CKTOTAL, CKMB, CKMBINDEX, TROPONINI in the last 168 hours.  BNP: Invalid input(s): POCBNP  CBG:  Recent Labs Lab 03/15/17 1342 03/15/17 1655 03/15/17 2057 03/15/17 2312 03/16/17 0252  GLUCAP 137* 155* 125* 160* 125*    Microbiology: Results for orders placed or performed during the hospital encounter of 02/14/17  Culture, blood (Routine X 2) w Reflex to ID Panel     Status: None   Collection Time: 02/14/17  8:08 AM  Result Value Ref Range Status   Specimen Description BLOOD RIGHT ANTECUBITAL  Final   Special Requests IN PEDIATRIC BOTTLE Blood Culture adequate volume  Final   Culture NO GROWTH 5 DAYS  Final   Report Status 02/19/2017 FINAL  Final  Culture, blood (Routine X 2) w Reflex to ID Panel     Status: None   Collection Time: 02/14/17  8:12 AM  Result Value Ref Range Status   Specimen Description BLOOD RIGHT HAND  Final   Special Requests IN PEDIATRIC BOTTLE Blood Culture adequate volume  Final   Culture NO GROWTH 5 DAYS  Final   Report  Status 02/19/2017 FINAL  Final  MRSA PCR Screening     Status: None   Collection Time: 02/14/17  5:44 PM  Result Value Ref Range Status   MRSA by PCR NEGATIVE NEGATIVE Final    Comment:        The GeneXpert MRSA Assay (FDA approved for NASAL specimens only), is one component of a comprehensive MRSA colonization surveillance program. It is not intended to diagnose MRSA infection nor to guide or monitor treatment for MRSA infections.   C difficile quick scan w PCR reflex     Status: None   Collection Time: 02/27/17  9:41 AM  Result Value Ref Range Status   C Diff antigen NEGATIVE NEGATIVE Final   C Diff toxin NEGATIVE NEGATIVE Final   C Diff interpretation No C. difficile detected.  Final  Culture, blood (Routine X 2)  w Reflex to ID Panel     Status: None   Collection Time: 02/27/17 10:39 AM  Result Value Ref Range Status   Specimen Description BLOOD RIGHT HAND  Final   Special Requests IN PEDIATRIC BOTTLE Blood Culture adequate volume  Final   Culture NO GROWTH 5 DAYS  Final   Report Status 03/04/2017 FINAL  Final  Culture, blood (Routine X 2) w Reflex to ID Panel     Status: None   Collection Time: 02/27/17 10:49 AM  Result Value Ref Range Status   Specimen Description BLOOD RIGHT HAND  Final   Special Requests IN PEDIATRIC BOTTLE Blood Culture adequate volume  Final   Culture NO GROWTH 5 DAYS  Final   Report Status 03/04/2017 FINAL  Final  C difficile quick scan w PCR reflex     Status: None   Collection Time: 03/04/17  2:17 PM  Result Value Ref Range Status   C Diff antigen NEGATIVE NEGATIVE Final   C Diff toxin NEGATIVE NEGATIVE Final   C Diff interpretation No C. difficile detected.  Final  Culture, blood (routine x 2)     Status: None (Preliminary result)   Collection Time: 03/11/17  8:42 AM  Result Value Ref Range Status   Specimen Description BLOOD RIGHT HAND  Final   Special Requests   Final    BOTTLES DRAWN AEROBIC ONLY Blood Culture adequate volume   Culture NO GROWTH 4 DAYS  Final   Report Status PENDING  Incomplete  Culture, blood (routine x 2)     Status: None (Preliminary result)   Collection Time: 03/11/17  8:46 AM  Result Value Ref Range Status   Specimen Description BLOOD RIGHT HAND  Final   Special Requests   Final    BOTTLES DRAWN AEROBIC AND ANAEROBIC Blood Culture adequate volume   Culture NO GROWTH 4 DAYS  Final   Report Status PENDING  Incomplete    Coagulation Studies: No results for input(s): LABPROT, INR in the last 72 hours.  Urinalysis: No results for input(s): COLORURINE, LABSPEC, PHURINE, GLUCOSEU, HGBUR, BILIRUBINUR, KETONESUR, PROTEINUR, UROBILINOGEN, NITRITE, LEUKOCYTESUR in the last 72 hours.  Invalid input(s): APPERANCEUR    Imaging: No results  found.   Medications:   . sodium chloride 10 mL/hr at 03/13/17 1832  . sodium chloride    . sodium chloride    . sodium chloride    . sodium chloride    . [START ON 03/17/2017] cefTAZidime (FORTAZ)  IV    . [START ON 03/17/2017] vancomycin     . calcium carbonate (dosed in mg elemental calcium)  250 mg of elemental calcium  Oral TID  . carvedilol  12.5 mg Oral BID WC  . chlorhexidine gluconate (MEDLINE KIT)  15 mL Mouth Rinse BID  . darbepoetin (ARANESP) injection - DIALYSIS  200 mcg Intravenous Q Tue-HD  . feeding supplement (NEPRO CARB STEADY)  1,000 mL Per Tube Q24H  . feeding supplement (PRO-STAT SUGAR FREE 64)  60 mL Per Tube QID  . [name]'s butt cream   Topical QID  . heparin  5,000 Units Subcutaneous Q8H  . insulin aspart  0-15 Units Subcutaneous Q4H  . mouth rinse  15 mL Mouth Rinse QID  . multivitamin  1 tablet Oral QHS  . pantoprazole sodium  40 mg Per Tube Q24H  . sodium chloride flush  10-40 mL Intracatheter Q12H   sodium chloride, sodium chloride, sodium chloride, sodium chloride, acetaminophen (TYLENOL) oral liquid 160 mg/5 mL, acetaminophen **OR** acetaminophen, albuterol, alteplase, fentaNYL (SUBLIMAZE) injection, heparin, heparin, HYDROmorphone (DILAUDID) injection, lidocaine (PF), lidocaine-prilocaine, metoCLOPramide **OR** metoCLOPramide (REGLAN) injection, metoprolol tartrate, ondansetron **OR** ondansetron (ZOFRAN) IV, pentafluoroprop-tetrafluoroeth, sodium chloride flush  Assessment/ Plan:   1. 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 VDRF: trach- dependent, off vent now 3. 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 ESRD - HD TTS, 4h 52mn 5. Anemia - HGB 8.4. Cont ESA. No Fe D/T osteo.  6. Secondary hyperparathyroidism - Ca 9.2 Phos 3.9, s/p Ca++ supp, now on 250 tid CaCO3  7. HTN/volume - BP's ok, cont coreg at 12.5 bid (25 bid at home), holding home isosorbide 8. Nutrition: Albumin 2.2 On tube feeding prostat/nepro  9. DM: per primary 10. NICM H/O  diastolic/systolic HF     LOS: 30 [name] W _0 _1 :29 AM

## 2017-03-16 NOTE — Progress Notes (Signed)
Upon my arrival pt was tachycardic and tachypenic. BBS were very diminished and pt was restless. Pt was placed back on vent and PRN Albuterol was given. Peak pressures kept increasing, pt was levaged yielding a moderate amount of tenacious, thick mucous. Pt is more comfortable and vital signs are improving. RT will continue to monitor.

## 2017-03-16 NOTE — Progress Notes (Signed)
PULMONARY / CRITICAL CARE MEDICINE   Name: [name] MRN: [id number] DOB: [birth year]    ADMISSION DATE:  02/14/2017 CONSULTATION DATE:  02/14/2017  REFERRING MD:  Feliz Beam MD  CHIEF COMPLAINT:  Sepsis, cardiac arrest  BRIEF SUMMARY:   36 year old female w/ ESRD d/t poorly controlled DM, bilateral transmetatarsal foot amputations, obesity, HTN, medical non-compliance as well as NICM. Admitted 5/19 with complaints of left leg pain, found to have radiological evidence of osteomyelitis with wound nonhealing, dehiscence, purulent drainage from the wound. Admitted for IV antibiotics, hemodialysis and possible surgery.  Found unresponsive in the room on 5/19. She had chest compressions, epi bicarb given. She was intubated by anesthesia and had ROSC in approximately 9 minutes. Downtime prior to code is unknown. She got dilaudid in the afternoon for leg pain.  SUBJECTIVE:  No events overnight, tolerating TC  VITAL SIGNS: BP 102/76 (BP Location: Right Arm)   Pulse 99   Temp 98 F (36.7 C) (Oral)   Resp (!) 26   Ht 5' 8.5" (1.74 m)   Wt 194 lb (88 kg)   LMP  (LMP Unknown) Comment: Patient trached  SpO2 97%   BMI 29.07 kg/m   VENTILATOR SETTINGS: FiO2 (%):  [30 %-35 %] 30 %  INTAKE / OUTPUT: I/O last 3 completed shifts: In: 886.4 [I.V.:365.7; NG/GT:520.7] Out: 140 [Drains:40; Stool:100]  General:  Frail, ill appearing female on TC HEENT: Trach in good position PSY: Unable to assess Neuro: Not following commands CV: RRR, Nl S1/S2, -M/R/G. PULM: Corase BS diffusely GI: Soft, NT, ND and +BS Extremities: warm/dry, - edema , wounds and amputation unchanged Skin: no rashes or lesions  LABS:  BMET  Recent Labs Lab 03/13/17 0557 03/14/17 0449 03/15/17 0455  NA 136 136 135  K 4.1 4.4 3.5  CL 96* 97* 95*  CO2 26 23 27   BUN 45* 63* 29*  CREATININE 3.78* 5.26* 3.17*  GLUCOSE 172* 146* 157*   Electrolytes  Recent Labs Lab 03/13/17 0557 03/14/17 0449 03/15/17 0455   CALCIUM 9.0 8.6* 8.3*  PHOS 3.0 5.1* 3.9   CBC  Recent Labs Lab 03/14/17 0449 03/15/17 0455 03/16/17 0444  WBC 22.8* 24.9* 23.3*  HGB 7.6* 7.5* 7.6*  HCT 25.7* 25.5* 25.4*  PLT 334 359 371   No results for input(s): PHART, PCO2ART, PO2ART in the last 168 hours.  Liver Enzymes  Recent Labs Lab 03/13/17 0557 03/14/17 0449 03/15/17 0455  ALBUMIN 2.4* 2.4* 2.2*   Glucose  Recent Labs Lab 03/15/17 1655 03/15/17 2057 03/15/17 2312 03/16/17 0252 03/16/17 0827 03/16/17 1233  GLUCAP 155* 125* 160* 125* 173* 140*   STUDIES:  Lower extremity ultrasound 5/18 >> no evidence of DVT CT head 5/19 >> normal exam Echo 5/20 >> EF 30-35%, increased LVF. Diffuse hypokinesis, akinesis of basilar mid-inferior myocardium, grade 2 diastolic dysfxn  EEG 5/20 >> finding c/w mod to severe global cerebral dysfxn. C/w anoxic injury given clinical course  LE [place] 5/21 >> negative MRI brain 5/24 >> ?thrombosed cortical vein, otherwise normal TEE 6/4 >> mild MR, normal AV, mild TR, mild PR, EF 30-35%, diffuse hypokinesis, no thrombus, no PFO, normal RV, moderate pericardial effusion with synechia suggesting some chronicity, no vegetations   CULTURES: Bcx 5/19 X 2 >> negative Blood cultures 6/1 >> negative c diff 6/1 >> negative  ANTIBIOTICS: Clinidamycin 5/19 >> 5/21 Vancomycin 5/19 >> 5/25 >> restart 5/30 Zosyn 5/19 >> 5/22 Meropenem 5/22 >> 5/30 Flagyl 5/30 >>off Ceftriaxone 5/30 >>off  SIGNIFICANT EVENTS:  5/19  Admit, cardiac arrest, transfer to ICU; hypothermia protocol started 5/24  Off pressors.  MRI w/out evidence of ischemia  6/03  Tolerated ATC couple hours, then back to vent, HD   LINES/TUBES: ETT 5/19 >> 5/31 Lt IJ CVL 5/19 >> out Trach 5/31 >>   ASSESSMENT / PLAN:  Discussion:  36 y/o F with PMH of poorly controlled DM, ESRD on HD and known gangrene of L achilles heel, admitted with non-healing wounds.  Admitted per TRH.  Received dilaudid for pain and subsequently  suffered a cardiac arrest.  Pulmonary: Acute respiratory failure - in setting of cardiac arrest, chronic critical illness, requiring ventilator for support, making progress with weaning. Currently on full vent support during HD 6/12 P: Maintain on TC as tolerated Maintain current trach size and type for now, sutures are out Trach care per protocol  Push PT, nutrition efforts as tolerated Intermittent CXR Primary working on source control for foot > second opinion obtained, remains a full code 6/12  PCCM will f/u.  Alyson Reedy, M.D. [facility] Pulmonary/Critical Care Medicine. Pager: [phone]. After hours pager: [phone].

## 2017-03-16 NOTE — Progress Notes (Signed)
Need order to change trach

## 2017-03-16 NOTE — Progress Notes (Signed)
PROGRESS NOTE        PATIENT DETAILS Name: [name] Age: 36 y.o. Sex: female Date of Birth: 1980/10/30 Admit Date: 02/14/2017 Admitting Physician Lady Deutscher, MD NIO:[id number], Helene Kelp, FNP  Note-care assumed by me on 6/13  Brief Narrative: Patient is a 36 y.o. female with history of ESRD on HD, poorly controlled diabetes, history of bilateral transmetatarsal foot amputation with subsequent left foot wound dehiscence (refused BKA as outpatient), chronic systolic heart failure felt to be secondary to nonischemic cardiomyopathy admitted on 02/14/17 with wound dehiscence and persistent drainage from the left trans-metatarsal amputation site, she was admitted to the [facility]. She was subsequently found to be unresponsive in her room on 5/19 after getting IV Dilaudid-she was  in asystole, CPR was started, with ROSC in around 9 minutes.She was then transferred to the intensive care unit, and subsequently underwent cooling and then rewarming. She was unable to be liberated off the ventilator, and as a result underwent tracheostomy on 5/31. Patient has been evaluated by neurology during this hospital stay, per neurology chances of meaningful neurological recovery is dismal. Hospital course has now been complicated by persistent leukocytosis, respiratory failure, and wound dehiscence with gangrene causing persistent SIRS pathophysiology. 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 If improvement continues, she is able to tolerate more weaning trial, maybe able to hold off on PEG tube. Patient tolerating attempts to  wean off the vent. Swallow study and PMV training, hold off on PEG. May need to decrease trach size , will request PCCM, last seen 6/12   Cardiac arrest on 5/19 with anoxic brain injury: She seems to be slowly improving, following commands.  Note-Evaluated by neurology on 5/29-felt to have poor overall prognoses for any meaningful recovery.    Systemic inflammatory response syndrome:/Persistent leukocytosis Continues to be intermittently febrile (low-grade), tachycardic-and has persistent leukocytosis. Likely secondary to left foot gangrene/infection and abscess-she is now status post AKA and still continues to have these symptoms. Blood cultures on 6/1, 6/13 continue to be negative. . Chest x-ray on 6/13 negative for pneumonia. C. difficile studies on 6/6 negative as well-RN does not report any significant diarrhea. She was on vancomycin and Tressie Ellis that was started on 6/14, and subsequently discontinued on 6/16 after AKA. Spoke with Dr Comer-ID on call, who has evaluated this patient in the past-he recommends that we resume her vancomycin and Fortaz-and continue for 5 days from 6/15 when she had a AKA  through 6/20. Fever has improved off any sedation of antibiotics, consult infectious disease if no improvement   Left trans-metatarsal stump wound dehiscence with infection and gangrene: Per operative note-pus/abscess noted extending up to the knee when BKA was attempted, subsequently underwent a AKA. Note, initially family was very reluctant to proceed with amputation, but given persistent leukocytosis/fever and potential risk of sepsis, family agreed for amputation, subsequently AKA was done on 6/15.  She  has a wound VAC in place which will be continued for 1 week postoperatively until 6/22  ESRD: Nephrology following-HD on TTS.   Anemia: Hemoglobin 7.5-9.0  Likely secondary to chronic disease-probably worsened by acute illness. No signs of bleeding, follow CBC.   DM-2: CBGs stable-continue with  SSI.   Chronic systolic heart failure/nonischemic cardiomyopathy (EF 30-35% by TEE on 6/4): Volume status remained stable-this is managed with dialysis-continue with Coreg.   Pericardial effusion: Seen on TEE-with no evidence of tamponade pathophysiology. Repeat echo at some point in the next few weeks.  Moderate protein calorie malnutrition: Continue NG tube feedings  Goals of care: Unfortunate 36 year old female with ESRD, cardiomyopathy, bilateral transmetatarsal amputations complicated by left stump dehiscence-suffered a cardiac arrest on admission-now with anoxic brain injury-ventilator dependent-tracheostomy in place, awaiting  initiation of oral feeding. Hospital course has been prolonged-complicated by failure to wean from the ventilator, persistent systemic inflammatory response syndrome due to left foot gangrene/infection-after extensive discussion with family-she remains a full code, with family choosing to continue with full aggressive care. 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 Her prognosis is still very guarded at this time, will await attempts to see if she can be liberated off the ventilator. Family is aware of the risk of decompensation and deterioration.   Telemetry (independently reviewed): Sinus tachycardia  Studies: Lower extremity ultrasound 5/18 >> no evidence of DVT CT head 5/19 >> normal exam Echo 5/20 >> EF 30-35%, increased LVF. Diffuse hypokinesis, akinesis of basilar mid-inferior myocardium, grade 2 diastolic dysfxn  EEG 9/23 >> finding c/w mod to severe global cerebral dysfxn. C/w anoxic injury given clinical course  LE [place] 5/21 >> negative MRI brain 5/24 >> ?thrombosed cortical vein, otherwise normal TEE 6/4 >> mild MR, normal AV, mild TR, mild PR, EF 30-35%, diffuse hypokinesis, no thrombus, no PFO, normal RV, moderate pericardial effusion with synechia suggesting some chronicity, no vegetations   Echo (reviewed):EF 30-35% on TEE done on  6/4  Morning labs/Imaging ordered: yes  DVT Prophylaxis: Prophylactic Heparin   Code Status: Full code   Family Communication: Mother at bedside  Disposition Plan: Remain inpatient in SDU  Antimicrobial agents: Anti-infectives    Start     Dose/Rate Route Frequency Ordered Stop   03/17/17 1800  cefTAZidime (FORTAZ) 2 g in dextrose 5 % 50 mL IVPB     2 g 100 mL/hr over 30 Minutes Intravenous Every T-Th-Sa (1800) 03/15/17 1143     03/17/17 1200  vancomycin (VANCOCIN) IVPB 1000 mg/200 mL premix     1,000 mg 200 mL/hr over 60 Minutes Intravenous Every T-Th-Sa (Hemodialysis) 03/15/17 1143     03/14/17 1200  vancomycin (VANCOCIN) IVPB 1000 mg/200 mL premix  Status:  Discontinued     1,000 mg 200 mL/hr over 60 Minutes Intravenous Every T-Th-Sa (Hemodialysis) 03/12/17 0912 03/14/17 1542   03/13/17 1430  ceFAZolin (ANCEF) IVPB 2g/100 mL premix  Status:  Discontinued     2 g 200 mL/hr over 30 Minutes Intravenous To ShortStay Surgical 03/12/17 1208 03/13/17 1113   03/13/17 1115  ceFAZolin (ANCEF) IVPB 2g/100 mL premix     2 g 200 mL/hr over 30 Minutes Intravenous To Surgery 03/13/17 1108 03/13/17 1506   03/12/17 1800  cefTAZidime (FORTAZ) 2 g in dextrose 5 % 50 mL IVPB  Status:  Discontinued     2 g 100 mL/hr over 30 Minutes Intravenous Every T-Th-Sa (1800) 03/12/17 0912 03/14/17 1542   03/12/17 1200  vancomycin (VANCOCIN) 1,500 mg in sodium chloride 0.9 % 250 mL IVPB     1,500 mg 250 mL/hr over 60 Minutes Intravenous Every Thu (Hemodialysis) 03/12/17 0912 03/12/17 1356  03/02/17 1200  cefTRIAXone (ROCEPHIN) 2 g in dextrose 5 % 50 mL IVPB     2 g 100 mL/hr over 30 Minutes Intravenous Every 24 hours 03/02/17 0810 03/06/17 1358   03/02/17 1200  vancomycin (VANCOCIN) IVPB 1000 mg/200 mL premix     1,000 mg 200 mL/hr over 60 Minutes Intravenous Every M-W-F (Hemodialysis) 03/02/17 0815 03/02/17 1457   03/02/17 1200  metroNIDAZOLE (FLAGYL) IVPB 500 mg     500 mg 100 mL/hr over 60  Minutes Intravenous Every 8 hours 03/02/17 0948 03/06/17 2230   02/26/17 1200  vancomycin (VANCOCIN) IVPB 1000 mg/200 mL premix     1,000 mg 200 mL/hr over 60 Minutes Intravenous Every T-Th-Sa (Hemodialysis) 02/25/17 1151 03/05/17 1325   02/25/17 1200  vancomycin (VANCOCIN) 2,000 mg in sodium chloride 0.9 % 500 mL IVPB     2,000 mg 250 mL/hr over 120 Minutes Intravenous  Once 02/25/17 1149 02/25/17 1449   02/25/17 0900  cefTRIAXone (ROCEPHIN) 2 g in dextrose 5 % 50 mL IVPB  Status:  Discontinued     2 g 100 mL/hr over 30 Minutes Intravenous Every 24 hours 02/25/17 0814 03/02/17 0810   02/25/17 0900  metroNIDAZOLE (FLAGYL) IVPB 500 mg  Status:  Discontinued     500 mg 100 mL/hr over 60 Minutes Intravenous Every 8 hours 02/25/17 0814 03/02/17 0948   02/20/17 1200  vancomycin (VANCOCIN) IVPB 1000 mg/200 mL premix  Status:  Discontinued     1,000 mg 200 mL/hr over 60 Minutes Intravenous Every M-W-F (Hemodialysis) 02/19/17 1506 02/20/17 1403   02/17/17 1800  meropenem (MERREM) 500 mg in sodium chloride 0.9 % 50 mL IVPB  Status:  Discontinued     500 mg 100 mL/hr over 30 Minutes Intravenous Daily-1800 02/17/17 1026 02/25/17 0814   02/17/17 1200  vancomycin (VANCOCIN) IVPB 1000 mg/200 mL premix     1,000 mg 200 mL/hr over 60 Minutes Intravenous Every T-Th-Sa (Hemodialysis) 02/14/17 1758 02/19/17 1612   02/14/17 2200  clindamycin (CLEOCIN) IVPB 900 mg  Status:  Discontinued     900 mg 100 mL/hr over 30 Minutes Intravenous Every 8 hours 02/14/17 1106 02/14/17 1928   02/14/17 2000  clindamycin (CLEOCIN) IVPB 900 mg  Status:  Discontinued     900 mg 100 mL/hr over 30 Minutes Intravenous Every 8 hours 02/14/17 1928 02/16/17 1047   02/14/17 1915  piperacillin-tazobactam (ZOSYN) IVPB 3.375 g  Status:  Discontinued     3.375 g 100 mL/hr over 30 Minutes Intravenous Every 12 hours 02/14/17 1801 02/17/17 1026   02/14/17 1845  vancomycin (VANCOCIN) 2,000 mg in sodium chloride 0.9 % 500 mL IVPB     2,000  mg 250 mL/hr over 120 Minutes Intravenous  Once 02/14/17 1758 02/14/17 2107   02/14/17 1115  clindamycin (CLEOCIN) IVPB 900 mg  Status:  Discontinued     900 mg 100 mL/hr over 30 Minutes Intravenous  Once 02/14/17 1106 02/14/17 2236   02/14/17 0945  clindamycin (CLEOCIN) IVPB 600 mg     600 mg 100 mL/hr over 30 Minutes Intravenous  Once 02/14/17 0931 02/14/17 1013      Procedures: ETT 5/19 >> 5/31 Lt IJ CVL 5/19 >> out Trach 5/31 >>  TEE 6/4 >> mild MR, normal AV, mild TR, mild PR, EF 30-35%, diffuse hypokinesis, no thrombus, no PFO, normal RV, moderate pericardial effusion with synechia suggesting some chronicity, no vegetations  Rt IJ TLC 6/13>> Left AKA 6/15>>  CONSULTS:  Infectious disease:  Neurology  Gastroenterology  Wound care  PCCM  Nephrology  Orthopedics  Time spent: 30 minutes-Greater than 50% of this time was spent in counseling, explanation of diagnosis, planning of further management, and coordination of care.  MEDICATIONS: Scheduled Meds: . calcium carbonate (dosed in mg elemental calcium)  250 mg of elemental calcium Oral TID  . carvedilol  12.5 mg Oral BID WC  . chlorhexidine gluconate (MEDLINE KIT)  15 mL Mouth Rinse BID  . darbepoetin (ARANESP) injection - DIALYSIS  200 mcg Intravenous Q Tue-HD  . feeding supplement (NEPRO CARB STEADY)  1,000 mL Per Tube Q24H  . feeding supplement (PRO-STAT SUGAR FREE 64)  60 mL Per Tube QID  . [name]'s butt cream   Topical QID  . heparin  5,000 Units Subcutaneous Q8H  . insulin aspart  0-15 Units Subcutaneous Q4H  . mouth rinse  15 mL Mouth Rinse QID  . multivitamin  1 tablet Oral QHS  . pantoprazole sodium  40 mg Per Tube Q24H  . sodium chloride flush  10-40 mL Intracatheter Q12H   Continuous Infusions: . sodium chloride 10 mL/hr at 03/13/17 1832  . sodium chloride    . sodium chloride    . sodium chloride    . sodium chloride    . [START ON 03/17/2017] cefTAZidime (FORTAZ)  IV    . [START ON  03/17/2017] vancomycin     PRN Meds:.sodium chloride, sodium chloride, sodium chloride, sodium chloride, acetaminophen (TYLENOL) oral liquid 160 mg/5 mL, acetaminophen **OR** acetaminophen, albuterol, alteplase, fentaNYL (SUBLIMAZE) injection, heparin, heparin, HYDROmorphone (DILAUDID) injection, lidocaine (PF), lidocaine-prilocaine, metoCLOPramide **OR** metoCLOPramide (REGLAN) injection, metoprolol tartrate, ondansetron **OR** ondansetron (ZOFRAN) IV, pentafluoroprop-tetrafluoroeth, sodium chloride flush   PHYSICAL EXAM: Vital signs: Vitals:   03/16/17 0823 03/16/17 0836 03/16/17 1135 03/16/17 1206  BP:  130/81  102/76  Pulse:  (!) 106 (!) 104 99  Resp:   16 (!) 26  Temp:    98 F (36.7 C)  TempSrc: Oral   Oral  SpO2:   99% 97%  Weight:      Height:       Filed Weights   03/14/17 1200 03/15/17 0459 03/16/17 0338  Weight: 83.9 kg (185 lb) 85.3 kg (188 lb) 88 kg (194 lb)   Body mass index is 29.07 kg/m.  General appearance:Sleeping when I walked in-easily arouses, squeeze my hands. Eyes: No scleral icterus HEENT: Atraumatic and Normocephalic Neck: supple, tracheostomy in place Resp:Good air entry bilaterally, some transmitted upper airway sounds CVS: S1 S2 regular, tachycardic GI: Bowel sounds present, Non tender and not distended with no gaurding, rigidity or rebound. Extremities: Left AKA stump-vac in place. Musculoskeletal:No digital cyanosis  I have personally reviewed following labs and imaging studies  LABORATORY DATA: CBC:  Recent Labs Lab 03/11/17 0842 03/12/17 0702 03/13/17 0557 03/14/17 0449 03/15/17 0455 03/16/17 0444  WBC 28.5* 24.0* 23.4* 22.8* 24.9* 23.3*  NEUTROABS 23.0* 20.2*  --  19.6*  --   --   HGB 9.1* 8.9* 8.1* 7.6* 7.5* 7.6*  HCT 30.5* 29.5* 27.2* 25.7* 25.5* 25.4*  MCV 97.4 96.7 97.5 96.6 98.1 95.5  PLT 377 380 363 334 359 244    Basic Metabolic Panel:  Recent Labs Lab 03/10/17 0722 03/11/17 0842 03/13/17 0557 03/14/17 0449  03/15/17 0455  NA 142 140 136 136 135  K 3.2* 4.0 4.1 4.4 3.5  CL 101 98* 96* 97* 95*  CO2 _0 GLUCOSE 173* 175* 172* 146* 157*  BUN 131* 74* 45* 63* 29*  CREATININE  6.92* 4.65* 3.78* 5.26* 3.17*  CALCIUM 9.6 9.3 9.0 8.6* 8.3*  PHOS 4.4 3.4 3.0 5.1* 3.9    GFR: Estimated Creatinine Clearance: 28.5 mL/min (A) (by C-G formula based on SCr of 3.17 mg/dL (H)).  Liver Function Tests:  Recent Labs Lab 03/10/17 0722 03/11/17 0842 03/13/17 0557 03/14/17 0449 03/15/17 0455  ALBUMIN 2.2* 2.5* 2.4* 2.4* 2.2*   No results for input(s): LIPASE, AMYLASE in the last 168 hours. No results for input(s): AMMONIA in the last 168 hours.  Coagulation Profile: No results for input(s): INR, PROTIME in the last 168 hours.  Cardiac Enzymes: No results for input(s): CKTOTAL, CKMB, CKMBINDEX, TROPONINI in the last 168 hours.  BNP (last 3 results) No results for input(s): PROBNP in the last 8760 hours.  HbA1C: No results for input(s): HGBA1C in the last 72 hours.  CBG:  Recent Labs Lab 03/15/17 1655 03/15/17 2057 03/15/17 2312 03/16/17 0252 03/16/17 0827  GLUCAP 155* 125* 160* 125* 173*    Lipid Profile: No results for input(s): CHOL, HDL, LDLCALC, TRIG, CHOLHDL, LDLDIRECT in the last 72 hours.  Thyroid Function Tests: No results for input(s): TSH, T4TOTAL, FREET4, T3FREE, THYROIDAB in the last 72 hours.  Anemia Panel: No results for input(s): VITAMINB12, FOLATE, FERRITIN, TIBC, IRON, RETICCTPCT in the last 72 hours.  Urine analysis:    Component Value Date/Time   COLORURINE AMBER (A) 02/14/2017 2111   APPEARANCEUR HAZY (A) 02/14/2017 2111   LABSPEC 1.015 02/14/2017 2111   PHURINE 5.0 02/14/2017 2111   GLUCOSEU NEGATIVE 02/14/2017 2111   HGBUR SMALL (A) 02/14/2017 2111   BILIRUBINUR NEGATIVE 02/14/2017 2111   KETONESUR NEGATIVE 02/14/2017 2111   PROTEINUR 30 (A) 02/14/2017 2111   UROBILINOGEN 0.2 09/21/2014 1908   NITRITE NEGATIVE 02/14/2017 2111    LEUKOCYTESUR TRACE (A) 02/14/2017 2111    Sepsis Labs: Lactic Acid, Venous    Component Value Date/Time   LATICACIDVEN 4.6 (HH) 02/14/2017 1807    MICROBIOLOGY: Recent Results (from the past 240 hour(s))  Culture, blood (routine x 2)     Status: None (Preliminary result)   Collection Time: 03/11/17  8:42 AM  Result Value Ref Range Status   Specimen Description BLOOD RIGHT HAND  Final   Special Requests   Final    BOTTLES DRAWN AEROBIC ONLY Blood Culture adequate volume   Culture NO GROWTH 4 DAYS  Final   Report Status PENDING  Incomplete  Culture, blood (routine x 2)     Status: None (Preliminary result)   Collection Time: 03/11/17  8:46 AM  Result Value Ref Range Status   Specimen Description BLOOD RIGHT HAND  Final   Special Requests   Final    BOTTLES DRAWN AEROBIC AND ANAEROBIC Blood Culture adequate volume   Culture NO GROWTH 4 DAYS  Final   Report Status PENDING  Incomplete    RADIOLOGY STUDIES/RESULTS: Ct Abdomen Wo Contrast  Result Date: 03/02/2017 CLINICAL DATA:  Preop for gastrostomy to EXAM: CT ABDOMEN WITHOUT CONTRAST TECHNIQUE: Multidetector CT imaging of the abdomen was performed following the standard protocol without IV contrast. COMPARISON:  None. FINDINGS: Lower chest: Bibasilar dependent and medial consolidation. Large pericardial effusion. Hepatobiliary: The left lobe of the liver is prominent and anterior to the stoma. The gallbladder is not clearly visualized. It may either be decompressed or absent due to cholecystectomy. Pancreas: Unremarkable Spleen: Unremarkable Adrenals/Urinary Tract: Kidneys and adrenal glands are within normal limits. Stomach/Bowel: The stomach is positioned deep to the liver and colon. Gastrostomy tube placement may be  problematic. No evidence of small-bowel obstruction. Feeding tube tip is in the proximal duodenum. Vascular/Lymphatic: Small para-aortic lymph nodes. Atherosclerotic vascular calcifications are noted. No evidence of aortic  aneurysm. Other: No free-fluid. Musculoskeletal: No vertebral compression deformity. IMPRESSION: The stomach is positioned deep to a prominent left lobe of the liver as well as the transverse colon. Gas is distension of the stomach and barium opacification of the transverse colon will be essential during the procedure. Bibasilar pulmonary consolidation. Large pericardial effusion. Electronically Signed   By: Marybelle Killings M.D.   On: 03/02/2017 07:18   Ct Head Wo Contrast  Result Date: 02/14/2017 CLINICAL DATA:  Cardiac arrest and, EXAM: CT HEAD WITHOUT CONTRAST TECHNIQUE: Contiguous axial images were obtained from the base of the skull through the vertex without intravenous contrast. COMPARISON:  None. FINDINGS: Brain: No mass lesion, intraparenchymal hemorrhage or extra-axial collection. No evidence of acute cortical infarct. Brain parenchyma and CSF-containing spaces are normal for age. Mildly asymmetric hypoattenuation of the right temporal lobe relative to the left is likely artifactual Vascular: No hyperdense vessel or unexpected calcification. Skull: Normal visualized skull base, calvarium and extracranial soft tissues. Sinuses/Orbits: No sinus fluid levels or advanced mucosal thickening. No mastoid effusion. Normal orbits. IMPRESSION: Normal head CT. Electronically Signed   By: Ulyses Jarred M.D.   On: 02/14/2017 22:23   Mr Brain Wo Contrast  Result Date: 02/18/2017 CLINICAL DATA:  Anoxic brain injury status post cardiac arrest EXAM: MRI HEAD WITHOUT CONTRAST TECHNIQUE: Multiplanar, multiecho pulse sequences of the brain and surrounding structures were obtained without intravenous contrast. IV contrast could not be administered due to the patient's poor renal function. COMPARISON:  Head CT 02/14/2017 FINDINGS: Brain: The midline structures are normal. There is no focal diffusion restriction to indicate acute infarct. The brain parenchymal signal is normal and there is no mass lesion. There is a focal  susceptibility abnormality at the left insula (series 12, image 55). Brain volume is normal for age without age-advanced or lobar predominant atrophy. The dura is normal and there is no extra-axial collection. Vascular: Major intracranial arterial and venous sinus flow voids are preserved. Skull and upper cervical spine: The visualized skull base, calvarium, upper cervical spine and extracranial soft tissues are normal. Sinuses/Orbits: No fluid levels or advanced mucosal thickening. No mastoid effusion. Normal orbits. IMPRESSION: 1. Focus of susceptibility in the left insula may indicate a thrombosed cortical vein. The location does not correspond to a large artery. 2. No associated ischemia or hemorrhage.  Otherwise normal brain. Electronically Signed   By: Ulyses Jarred M.D.   On: 02/18/2017 14:55   Ir Fluoro Guide Cv Line Right  Result Date: 03/11/2017 INDICATION: Osteomyelitis EXAM: TUNNELED RIGHT JUGULAR PICC LINE PLACEMENT WITH ULTRASOUND AND FLUOROSCOPIC GUIDANCE MEDICATIONS: None. ANESTHESIA/SEDATION: None FLUOROSCOPY TIME:  Fluoroscopy Time:  minutes 30 seconds (1 mGy). COMPLICATIONS: None immediate. PROCEDURE: The patient was advised of the possible risks and complications and agreed to undergo the procedure. The patient was then brought to the angiographic suite for the procedure. The right neck was prepped with chlorhexidine, draped in the usual sterile fashion using maximum barrier technique (cap and mask, sterile gown, sterile gloves, large sterile sheet, hand hygiene and cutaneous antiseptic). Local anesthesia was attained by infiltration with 1% lidocaine. Ultrasound demonstrated patency of the right jugular vein, and this was documented with an image. Under real-time ultrasound guidance, this vein was accessed with a 21 gauge micropuncture needle and image documentation was performed. A long subcutaneous tract was employed. The needle  was exchanged over a guidewire for a peel-away sheath through  which a 26 cm 5 [place] double lumen power injectable PICC was advanced, and positioned with its tip at the lower SVC/right atrial junction. The cuff was positioned in the subcutaneous tract. Fluoroscopy during the procedure and fluoro spot radiograph confirms appropriate catheter position. The catheter was flushed, secured to the skin with Prolene sutures, and covered with a sterile dressing. IMPRESSION: Successful placement of a tunneled right jugular PICC with sonographic and fluoroscopic guidance. The catheter is ready for use. Electronically Signed   By: Marybelle Killings M.D.   On: 03/11/2017 15:31   Ir US Guide Vasc Access Right  Result Date: 03/11/2017 INDICATION: Osteomyelitis EXAM: TUNNELED RIGHT JUGULAR PICC LINE PLACEMENT WITH ULTRASOUND AND FLUOROSCOPIC GUIDANCE MEDICATIONS: None. ANESTHESIA/SEDATION: None FLUOROSCOPY TIME:  Fluoroscopy Time:  minutes 30 seconds (1 mGy). COMPLICATIONS: None immediate. PROCEDURE: The patient was advised of the possible risks and complications and agreed to undergo the procedure. The patient was then brought to the angiographic suite for the procedure. The right neck was prepped with chlorhexidine, draped in the usual sterile fashion using maximum barrier technique (cap and mask, sterile gown, sterile gloves, large sterile sheet, hand hygiene and cutaneous antiseptic). Local anesthesia was attained by infiltration with 1% lidocaine. Ultrasound demonstrated patency of the right jugular vein, and this was documented with an image. Under real-time ultrasound guidance, this vein was accessed with a 21 gauge micropuncture needle and image documentation was performed. A long subcutaneous tract was employed. The needle was exchanged over a guidewire for a peel-away sheath through which a 26 cm 5 [place] double lumen power injectable PICC was advanced, and positioned with its tip at the lower SVC/right atrial junction. The cuff was positioned in the subcutaneous tract. Fluoroscopy  during the procedure and fluoro spot radiograph confirms appropriate catheter position. The catheter was flushed, secured to the skin with Prolene sutures, and covered with a sterile dressing. IMPRESSION: Successful placement of a tunneled right jugular PICC with sonographic and fluoroscopic guidance. The catheter is ready for use. Electronically Signed   By: Marybelle Killings M.D.   On: 03/11/2017 15:31   Dg Chest Port 1 View  Result Date: 03/11/2017 CLINICAL DATA:  Leukocytosis. EXAM: PORTABLE CHEST 1 VIEW COMPARISON:  03/03/2017 FINDINGS: Patient is rotated to the left. The cardio pericardial silhouette is enlarged. Slight improvement in left base aeration. Tracheostomy tube remains in place. A feeding tube passes into the stomach although the distal tip position is not included on the film. The visualized bony structures of the thorax are intact. Telemetry leads overlie the chest. IMPRESSION: Persistent enlargement of the cardiopericardial silhouette without evidence for overt pulmonary edema or substantial pleural effusion. Electronically Signed   By: Misty Stanley M.D.   On: 03/11/2017 08:37   Dg Chest Port 1 View  Result Date: 03/03/2017 CLINICAL DATA:  Respiratory failure. EXAM: PORTABLE CHEST 1 VIEW COMPARISON:  03/01/2017 . FINDINGS: Tracheostomy tube and feeding tube in stable position. Cardiomegaly with mild diffuse interstitial prominence suggesting mild CHF. Persistent atelectasis and consolidation left lower lobe. Small left pleural effusion cannot be excluded. No pneumothorax. IMPRESSION: 1. Tracheostomy tube and feeding tube in stable position. 2. Cardiomegaly with diffuse mild interstitial prominence suggesting mild CHF. 3. Persistent left lower lobe atelectasis and consolidation. Small left pleural effusion cannot be excluded . Electronically Signed   By: Marcello Moores  Register   On: 03/03/2017 07:09   Dg Chest Port 1 View  Result Date: 03/01/2017 CLINICAL DATA:  Respiratory failure EXAM: PORTABLE  CHEST 1 VIEW COMPARISON:  02/27/2017 FINDINGS: 0546 hours. Patient rotated to the left. Tracheostomy tube remains in place. A feeding tube passes into the stomach although the distal tip position is not included on the film. The cardio pericardial silhouette is enlarged. Left base collapse/consolidation again noted. There is pulmonary vascular congestion without overt pulmonary edema. IMPRESSION: Rotated film with cardiomegaly and vascular congestion. Persistent left base collapse/ consolidation. Electronically Signed   By: Misty Stanley M.D.   On: 03/01/2017 07:43   Dg Chest Port 1 View  Result Date: 02/27/2017 CLINICAL DATA:  Hypoxia. EXAM: PORTABLE CHEST 1 VIEW COMPARISON:  02/26/2017. FINDINGS: Tracheostomy to in stable position. Stable cardiomegaly. Low lung volumes. Progressive left lower lobe atelectasis and infiltrate. Small left pleural effusion cannot be excluded . IMPRESSION: 1. Tracheostomy tube in stable position. 2. Progressive left lower lobe atelectasis and infiltrate. Small left pleural effusion cannot be excluded . Electronically Signed   By: Marcello Moores  Register   On: 02/27/2017 07:01   Dg Chest Port 1 View  Result Date: 02/26/2017 CLINICAL DATA:  Status post tracheostomy placement. EXAM: PORTABLE CHEST 1 VIEW COMPARISON:  Earlier today. FINDINGS: The endotracheal tube has been removed and replaced with a tracheostomy tube in satisfactory position. The nasogastric tube and esophageal probe have been removed. The left jugular catheter has been removed. No pneumothorax. Grossly stable enlarged cardiac silhouette. Left lower lobe opacity with improvement laterally since 02/25/2017. Clear right lung. Unremarkable bones. IMPRESSION: 1. Tracheostomy tube in satisfactory position. 2. Left lower lobe atelectasis or pneumonia with mild improvement since 2 days ago. 3. Stable cardiomegaly. Electronically Signed   By: Claudie Revering M.D.   On: 02/26/2017 16:02   Dg Chest Port 1 View  Result Date:  02/26/2017 CLINICAL DATA:  Hypoxia.  Shortness of breath. EXAM: PORTABLE CHEST 1 VIEW COMPARISON:  02/25/2017. FINDINGS: Endotracheal tube, NG tube, left IJ line esophageal probe in stable position. Cardiomegaly with bilateral pulmonary interstitial infiltrates consistent with CHF. Left base atelectasis . Small left pleural effusion. Similar findings noted on prior exam. No pneumothorax. IMPRESSION: 1. Lines and tubes in stable position. 2. Cardiomegaly with bilateral pulmonary interstitial prominence and small left pleural effusion noted consistent CHF. Left base atelectasis. Similar findings noted on prior exam. Electronically Signed   By: [place]   On: 02/26/2017 07:18   Dg Chest Port 1 View  Result Date: 02/25/2017 CLINICAL DATA:  Respiratory failure, intubated patient. Diabetes, cardiomyopathy, morbid obesity, end-stage renal disease. EXAM: PORTABLE CHEST 1 VIEW COMPARISON:  Portable chest x-ray of Feb 24, 2017 FINDINGS: The lungs are adequately inflated. The pulmonary interstitial markings are slightly less prominent today. The pulmonary vascularity remains engorged. The cardiac silhouette remains enlarged. Retrocardiac region on the left is slightly less dense. The endotracheal tube tip lies 5.7 cm above the carina. The esophagogastric tube tip projects below the inferior margin of the image. The left internal jugular venous catheter tip projects over the proximal SVC. IMPRESSION: Slight interval improvement in pulmonary interstitial edema. Stable cardiomegaly. Persistent left lower lobe atelectasis or pneumonia. The support tubes are in reasonable position. Electronically Signed   By: David  Martinique M.D.   On: 02/25/2017 07:57   Dg Chest Port 1 View  Result Date: 02/24/2017 CLINICAL DATA:  Respiratory failure EXAM: PORTABLE CHEST 1 VIEW COMPARISON:  Two days ago FINDINGS: Endotracheal tube tip at the clavicular heads. An orogastric tube reaches the stomach. Esophageal thermistor. Left IJ  central line with tip at the SVC  level. Cardiomegaly and diffuse hazy opacity with cephalized blood flow. No definitive effusion. No pneumothorax. IMPRESSION: 1. Stable positioning of tubes and central line. 2. CHF pattern. Electronically Signed   By: Monte Fantasia M.D.   On: 02/24/2017 07:31   Dg Chest Port 1 View  Result Date: 02/22/2017 CLINICAL DATA:  Respiratory failure EXAM: PORTABLE CHEST 1 VIEW COMPARISON:  02/21/2017 FINDINGS: Endotracheal tube terminates 5 cm above the carina. Cardiomegaly with mild interstitial edema. Retrocardiac opacity, possibly reflecting a combination of atelectasis and pleural effusion, pneumonia not excluded. No pneumothorax. Left IJ venous catheter terminates at the cavoatrial junction. Enteric tube courses into the stomach. IMPRESSION: Endotracheal tube terminates 5 cm above the carina. Cardiomegaly with mild interstitial edema. Retrocardiac opacity, possibly atelectasis and pleural effusion, pneumonia not excluded. Electronically Signed   By: Julian Hy M.D.   On: 02/22/2017 07:30   Dg Chest Port 1 View  Result Date: 02/21/2017 CLINICAL DATA:  Endotracheal tube. EXAM: PORTABLE CHEST 1 VIEW COMPARISON:  02/19/2017 FINDINGS: Endotracheal tube terminates approximately 5 cm above the carina. Enteric tube courses into the left upper abdomen with tip not imaged. Left jugular catheter terminates over the mid SVC. The cardiac silhouette remains enlarged. Pulmonary vascular congestion and bilateral parenchymal lung opacities have not significantly changed. No large pleural effusion or pneumothorax is identified. IMPRESSION: Unchanged pulmonary edema. Electronically Signed   By: Logan Bores M.D.   On: 02/21/2017 07:42   Dg Chest Port 1 View  Result Date: 02/19/2017 CLINICAL DATA:  35 year old female with respiratory failure. EXAM: PORTABLE CHEST 1 VIEW COMPARISON:  02/18/2017 and prior radiograph FINDINGS: An endotracheal tube with tip 3.5 cm above the carina, left  IJ central venous catheter with tip overlying the lower SVC, and NG tube entering the stomach with tip off the field of view again noted. Pulmonary edema has decreased from the prior study. Continued left lower lung atelectasis/ consolidation again noted. There is no evidence of pneumothorax. No other changes are noted. IMPRESSION: Decreased pulmonary edema, otherwise unchanged appearance of the chest. Electronically Signed   By: Margarette Canada M.D.   On: 02/19/2017 08:03   Dg Chest Port 1 View  Result Date: 02/18/2017 CLINICAL DATA:  Respiratory failure EXAM: PORTABLE CHEST 1 VIEW COMPARISON:  02/17/2017 FINDINGS: Cardiac shadow remains enlarged. Endotracheal tube, nasogastric catheter and esophageal probe are again seen and stable. Left jugular central line is again noted and stable. No pneumothorax is seen. Diffuse vascular congestion and increased parenchymal opacity is noted consistent with progressive CHF. No focal confluent infiltrate is noted. IMPRESSION: Progressive changes of CHF. Electronically Signed   By: Inez Catalina M.D.   On: 02/18/2017 08:15   Dg Chest Port 1 View  Result Date: 02/17/2017 CLINICAL DATA:  Pulmonary edema EXAM: PORTABLE CHEST 1 VIEW COMPARISON:  02/16/2017 FINDINGS: Endotracheal tube, NG tube, left jugular central venous catheter are stable. Severe cardiomegaly is stable. Mild vascular congestion is stable. No consolidation. No interstitial edema. IMPRESSION: Stable cardiomegaly and mild vascular congestion. Electronically Signed   By: Marybelle Killings M.D.   On: 02/17/2017 07:35   Dg Chest Port 1 View  Result Date: 02/16/2017 CLINICAL DATA:  Acute respiratory failure. EXAM: PORTABLE CHEST 1 VIEW COMPARISON:  Radiograph of Feb 14, 2017. FINDINGS: Stable cardiomegaly. Endotracheal and nasogastric tubes are unchanged in position. Left internal jugular catheter is unchanged with distal tip in expected position of SVC. No pneumothorax is noted. Lungs are clear. Bony thorax is  unremarkable. IMPRESSION: Stable support apparatus.  Stable cardiomegaly.  Electronically Signed   By: Marijo Conception, M.D.   On: 02/16/2017 07:30   Dg Chest Port 1 View  Result Date: 02/14/2017 CLINICAL DATA:  Central line placement EXAM: PORTABLE CHEST 1 VIEW COMPARISON:  02/14/2017 at 0913 hours FINDINGS: Endotracheal tube terminates 4.5 cm above the carina. Lungs are clear.  No pleural effusion or pneumothorax. Cardiomegaly. Left IJ venous catheter terminates at cavoatrial junction. Enteric tube courses into the stomach. Defibrillator pads overlying the left hemithorax. IMPRESSION: Endotracheal tube terminates 4.5 cm above the carina. Left IJ venous catheter terminates at the cavoatrial junction. Electronically Signed   By: Julian Hy M.D.   On: 02/14/2017 18:31   Dg Abd Portable 1v  Result Date: 03/02/2017 CLINICAL DATA:  Encounter for feeding tube placement EXAM: PORTABLE ABDOMEN - 1 VIEW COMPARISON:  Portable exam 1712 hours compared to CT abdomen and pelvis of 03/01/2017 FINDINGS: Feeding tube traverses abdomen with tip projecting over distal antrum near pylorus. Cardiac silhouette appears enlarged. Visualized bowel gas pattern normal. Osseous structures unremarkable. IMPRESSION: Tip of feeding tube projects over distal gastric antrum near pylorus. Electronically Signed   By: Lavonia Dana M.D.   On: 03/02/2017 17:21   Dg Abd Portable 1v  Result Date: 02/27/2017 CLINICAL DATA:  Feeding tube placement EXAM: PORTABLE ABDOMEN - 1 VIEW COMPARISON:  None. FINDINGS: Feeding tube with the tip projecting over the antrum of the stomach. There is no bowel dilatation to suggest obstruction. There is no evidence of pneumoperitoneum, portal venous gas or pneumatosis. There are no pathologic calcifications along the expected course of the ureters. The osseous structures are unremarkable. IMPRESSION: Feeding tube with the tip projecting over the antrum of the stomach. Electronically Signed   By: Kathreen Devoid    On: 02/27/2017 11:05   US Abdomen Limited Ruq  Result Date: 02/18/2017 CLINICAL DATA:  Elevated liver function studies. EXAM: US ABDOMEN LIMITED - RIGHT UPPER QUADRANT COMPARISON:  None. FINDINGS: Gallbladder: No gallstones or wall thickening visualized. No sonographic Murphy sign noted by sonographer. Common bile duct: Diameter: 7 mm, normal Liver: Circumscribed hyperechoic lesion with central hypoechoic appearance demonstrated along the dome of the liver measuring 2.8 cm maximal diameter. This is likely to represent a cavernous hemangioma, but the appearance on ultrasound is somewhat atypical. Consider follow-up with elective MRI of the liver for better characterization. IMPRESSION: No evidence of cholelithiasis or cholecystitis. Circumscribed hyperechoic lesion in the dome of the liver measuring 2.8 cm maximal diameter. This is probably a cavernous hemangioma but appearance is somewhat atypical. Suggest elective MRI liver in follow-up for better characterization. Electronically Signed   By: Lucienne Capers M.D.   On: 02/18/2017 02:10     LOS: 30 days   Reyne Dumas, MD  [facility] Pager: [phone]  If 7PM-7AM, please contact night-coverage [email or web address] Password [email or web address] 03/16/2017, 12:20 PM

## 2017-03-16 NOTE — Progress Notes (Signed)
  Speech Language Pathology Treatment: [name] Speaking valve  Patient Details Name: [name] MRN: [id number] DOB: 1981/04/16 Today's Date: 03/16/2017 Time: 3912-2583 SLP Time Calculation (min) (ACUTE ONLY): 19 min  Assessment / Plan / Recommendation Clinical Impression  Pt needs downsize of trach to be able to progress with PMSV or PO intake. Pt making effort to redirect air with PMSV placement, following commands to cough, push air, hum, and speak, but overt resistance noted. Pt will not be able to effectively expectorate secretions or aspirated material without access to upper airway. Please address with trach downsize.    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 ETT 5/19 and trach'd 5/31. Hospital course included cardiac arrest, transfer to ICU      SLP Plan  Continue with current plan of care (downsize )       Recommendations         Patient may use Passy-Muir Speech Valve: with SLP only PMSV Supervision: Full MD: Please consider changing trach tube to : Cuffless;Smaller size         Plan: Continue with current plan of care (downsize )       GO               Harlon Ditty, MA CCC-SLP [phone]  Claudine Mouton 03/16/2017, 10:36 AM

## 2017-03-16 NOTE — Progress Notes (Signed)
Patient ID: [name], female   DOB: 09-Sep-1981, 36 y.o.   MRN: [id number] Postop day 3 left above-the-knee amputation. The dressing is clean and dry minimal drainage and the wound VAC. We will continue the wound VAC for 1 week postoperatively.

## 2017-03-16 NOTE — Progress Notes (Addendum)
Patient apparently improving after amputation and beginning to wean on the vent.  SLP has been written for to attempt swallow study and PMV training.  TRH note states may hold on g-tube for now to see if she is able to avoid placement.  Please let us know/reconsult [place] going forward if a g-tube is warranted in this patient.  [name] E 9:40 AM 03/16/2017

## 2017-03-16 NOTE — Progress Notes (Signed)
CSW received call that patient mom is inquiring about resources to help pay for patient's house bills.  CSW provided pt mom with list of crisis resources and also discussed likelihood that patient would need extended care and may want to start discussing longer term options for her home.  CSW continuing to follow to assist with placement needs.  Burna Sis, LCSW Clinical Social Worker [phone]

## 2017-03-17 ENCOUNTER — Inpatient Hospital Stay (HOSPITAL_COMMUNITY): Payer: Medicaid Other

## 2017-03-17 LAB — GLUCOSE, CAPILLARY
GLUCOSE-CAPILLARY: 169 mg/dL — AB (ref 65–99)
GLUCOSE-CAPILLARY: 159 mg/dL — AB (ref 65–99)
GLUCOSE-CAPILLARY: 149 mg/dL — AB (ref 65–99)
GLUCOSE-CAPILLARY: 111 mg/dL — AB (ref 65–99)
GLUCOSE-CAPILLARY: 171 mg/dL — AB (ref 65–99)

## 2017-03-17 LAB — COMPREHENSIVE METABOLIC PANEL
ALT: 6 U/L — ABNORMAL LOW (ref 14–54)
ANION GAP: 15 (ref 5–15)
AST: 21 U/L (ref 15–41)
Albumin: 2.1 g/dL — ABNORMAL LOW (ref 3.5–5.0)
Alkaline Phosphatase: 227 U/L — ABNORMAL HIGH (ref 38–126)
BUN: 95 mg/dL — AB (ref 6–20)
CHLORIDE: 97 mmol/L — AB (ref 101–111)
CO2: 24 mmol/L (ref 22–32)
Calcium: 8.7 mg/dL — ABNORMAL LOW (ref 8.9–10.3)
Creatinine, Ser: 5.81 mg/dL — ABNORMAL HIGH (ref 0.44–1.00)
GFR, EST AFRICAN AMERICAN: 10 mL/min — AB (ref >60)
GFR, EST NON AFRICAN AMERICAN: 8 mL/min — AB (ref >60)
Glucose, Bld: 175 mg/dL — ABNORMAL HIGH (ref 65–99)
POTASSIUM: 3.1 mmol/L — AB (ref 3.5–5.1)
Sodium: 136 mmol/L (ref 135–145)
Total Bilirubin: 1.5 mg/dL — ABNORMAL HIGH (ref 0.3–1.2)
Total Protein: 6.3 g/dL — ABNORMAL LOW (ref 6.5–8.1)

## 2017-03-17 LAB — CBC
HCT: 26.1 % — ABNORMAL LOW (ref 36.0–46.0)
Hemoglobin: 7.6 g/dL — ABNORMAL LOW (ref 12.0–15.0)
MCH: 27.7 pg (ref 26.0–34.0)
MCHC: 29.1 g/dL — ABNORMAL LOW (ref 30.0–36.0)
MCV: 95.3 fL (ref 78.0–100.0)
PLATELETS: 402 10*3/uL — AB (ref 150–400)
RBC: 2.74 MIL/uL — ABNORMAL LOW (ref 3.87–5.11)
RDW: 17.1 % — AB (ref 11.5–15.5)
WBC: 19.4 10*3/uL — ABNORMAL HIGH (ref 4.0–10.5)

## 2017-03-17 MED ORDER — HEPARIN SODIUM (PORCINE) 1000 UNIT/ML DIALYSIS: 1000.0000 [IU] | INTRAMUSCULAR | Status: DC | PRN

## 2017-03-17 MED ORDER — SODIUM CHLORIDE 0.9 % IV SOLN: 100.0000 mL | INTRAVENOUS | Status: DC | PRN

## 2017-03-17 MED ORDER — DARBEPOETIN ALFA 200 MCG/0.4ML IJ SOSY
PREFILLED_SYRINGE | INTRAMUSCULAR | Status: AC
  Administered 2017-03-17: 200 ug via INTRAVENOUS
  Filled 2017-03-17: qty 0.4

## 2017-03-17 MED ORDER — PENTAFLUOROPROP-TETRAFLUOROETH EX AERO: 1.0000 "application " | INHALATION_SPRAY | CUTANEOUS | Status: DC | PRN

## 2017-03-17 MED ORDER — LIDOCAINE-PRILOCAINE 2.5-2.5 % EX CREA: 1.0000 "application " | TOPICAL_CREAM | CUTANEOUS | Status: DC | PRN

## 2017-03-17 MED ORDER — LIDOCAINE HCL (PF) 1 % IJ SOLN
5.0000 mL | INTRAMUSCULAR | Status: DC | PRN
Start: 2017-03-17 — End: 2017-03-24

## 2017-03-17 MED ORDER — VANCOMYCIN HCL IN DEXTROSE 1-5 GM/200ML-% IV SOLN
INTRAVENOUS | Status: AC
  Administered 2017-03-17: 1000 mg via INTRAVENOUS
  Filled 2017-03-17: qty 200

## 2017-03-17 NOTE — Progress Notes (Signed)
Pt trach changed per protocol by RT x 2.  Current trach exchanged for same size and type, #6 Shiley, cuffed.  Placement of new trach confirmed by positive color change on ETCO2 detector.  Pt tolerated procedure fairly well with no distress.  Will continue to monitor.

## 2017-03-17 NOTE — Progress Notes (Signed)
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 The patient is having drainage of mucopurulent drainage from the wound. 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 Diarrhea and rectal tube . Right foot with protecting boot  Objective:  Vital signs in last 24 hours:  Temp:  [98 F (36.7 C)-99.9 F (37.7 C)] 99.9 F (37.7 C) (06/19 0800) Pulse Rate:  [99-119] 106 (06/19 0800) Resp:  [16-35] 16 (06/19 0836) BP: (93-124)/(55-95) 106/76 (06/19 0836) SpO2:  [94 %-100 %] 100 % (06/19 0836) FiO2 (%):  [30 %] 30 % (06/19 0847) Weight:  [192 lb (87.1 kg)] 192 lb (87.1 kg) (06/19 0405)  Weight change: -2 lb (-0.907 kg) Filed Weights   03/15/17 0459 03/16/17 0338 03/17/17 0405  Weight: 188 lb (85.3 kg) 194 lb (88 kg) 192 lb (87.1 kg)    Intake/Output: I/O last 3 completed shifts: In: 1245.7 [I.V.:360; NG/GT:885.7] Out: 100 [Stool:100]   Intake/Output this shift:  No intake/output data recorded.   CVS- RRR RS- CTA ABD- BS present soft non-distended EXT- no edema  LUA AVF + bruit    Basic Metabolic Panel:  Recent Labs Lab 03/11/17 0842 03/13/17 0557 03/14/17 0449 03/15/17 0455 03/17/17 0405  NA 140 136 136 135 136  K 4.0 4.1 4.4 3.5 3.1*  CL 98* 96* 97* 95* 97*  CO2 _0 GLUCOSE 175* 172* 146* 157* 175*  BUN 74* 45* 63* 29* 95*  CREATININE 4.65* 3.78* 5.26* 3.17* 5.81*  CALCIUM 9.3 9.0 8.6* 8.3* 8.7*  PHOS 3.4 3.0 5.1* 3.9  --     Liver Function Tests:  Recent Labs Lab 03/11/17 0842 03/13/17 0557 03/14/17 0449 03/15/17 0455 03/17/17 0405  AST  --   --   --   --  21  ALT  --   --   --   --  6*  ALKPHOS  --   --   --   --  227*  BILITOT  --   --   --   --  1.5*  PROT  --   --   --   --  6.3*  ALBUMIN 2.5* 2.4* 2.4* 2.2* 2.1*   No results for input(s): LIPASE, AMYLASE in  the last 168 hours. No results for input(s): AMMONIA in the last 168 hours.  CBC:  Recent Labs Lab 03/11/17 0842 03/12/17 0702 03/13/17 0557 03/14/17 0449 03/15/17 0455 03/16/17 0444 03/17/17 0405  WBC 28.5* 24.0* 23.4* 22.8* 24.9* 23.3* 19.4*  NEUTROABS 23.0* 20.2*  --  19.6*  --   --   --   HGB 9.1* 8.9* 8.1* 7.6* 7.5* 7.6* 7.6*  HCT 30.5* 29.5* 27.2* 25.7* 25.5* 25.4* 26.1*  MCV 97.4 96.7 97.5 96.6 98.1 95.5 95.3  PLT 377 380 363 334 359 371 402*    Cardiac Enzymes: No results for input(s): CKTOTAL, CKMB, CKMBINDEX, TROPONINI in the last 168 hours.  BNP: Invalid input(s): POCBNP  CBG:  Recent Labs Lab 03/16/17 1711 03/16/17 1920 03/16/17 2325 03/17/17 0403 03/17/17 0825  GLUCAP 137* 135* 156* 169* 159*    Microbiology: Results for orders placed or performed during the hospital encounter of 02/14/17  Culture, blood (Routine X 2) w Reflex to ID Panel     Status: None   Collection Time: 02/14/17  8:08 AM  Result Value Ref Range Status  Specimen Description BLOOD RIGHT ANTECUBITAL  Final   Special Requests IN PEDIATRIC BOTTLE Blood Culture adequate volume  Final   Culture NO GROWTH 5 DAYS  Final   Report Status 02/19/2017 FINAL  Final  Culture, blood (Routine X 2) w Reflex to ID Panel     Status: None   Collection Time: 02/14/17  8:12 AM  Result Value Ref Range Status   Specimen Description BLOOD RIGHT HAND  Final   Special Requests IN PEDIATRIC BOTTLE Blood Culture adequate volume  Final   Culture NO GROWTH 5 DAYS  Final   Report Status 02/19/2017 FINAL  Final  MRSA PCR Screening     Status: None   Collection Time: 02/14/17  5:44 PM  Result Value Ref Range Status   MRSA by PCR NEGATIVE NEGATIVE Final    Comment:        The GeneXpert MRSA Assay (FDA approved for NASAL specimens only), is one component of a comprehensive MRSA colonization surveillance program. It is not intended to diagnose MRSA infection nor to guide or monitor treatment for MRSA  infections.   C difficile quick scan w PCR reflex     Status: None   Collection Time: 02/27/17  9:41 AM  Result Value Ref Range Status   C Diff antigen NEGATIVE NEGATIVE Final   C Diff toxin NEGATIVE NEGATIVE Final   C Diff interpretation No C. difficile detected.  Final  Culture, blood (Routine X 2) w Reflex to ID Panel     Status: None   Collection Time: 02/27/17 10:39 AM  Result Value Ref Range Status   Specimen Description BLOOD RIGHT HAND  Final   Special Requests IN PEDIATRIC BOTTLE Blood Culture adequate volume  Final   Culture NO GROWTH 5 DAYS  Final   Report Status 03/04/2017 FINAL  Final  Culture, blood (Routine X 2) w Reflex to ID Panel     Status: None   Collection Time: 02/27/17 10:49 AM  Result Value Ref Range Status   Specimen Description BLOOD RIGHT HAND  Final   Special Requests IN PEDIATRIC BOTTLE Blood Culture adequate volume  Final   Culture NO GROWTH 5 DAYS  Final   Report Status 03/04/2017 FINAL  Final  C difficile quick scan w PCR reflex     Status: None   Collection Time: 03/04/17  2:17 PM  Result Value Ref Range Status   C Diff antigen NEGATIVE NEGATIVE Final   C Diff toxin NEGATIVE NEGATIVE Final   C Diff interpretation No C. difficile detected.  Final  Culture, blood (routine x 2)     Status: None   Collection Time: 03/11/17  8:42 AM  Result Value Ref Range Status   Specimen Description BLOOD RIGHT HAND  Final   Special Requests   Final    BOTTLES DRAWN AEROBIC ONLY Blood Culture adequate volume   Culture NO GROWTH 5 DAYS  Final   Report Status 03/16/2017 FINAL  Final  Culture, blood (routine x 2)     Status: None   Collection Time: 03/11/17  8:46 AM  Result Value Ref Range Status   Specimen Description BLOOD RIGHT HAND  Final   Special Requests   Final    BOTTLES DRAWN AEROBIC AND ANAEROBIC Blood Culture adequate volume   Culture NO GROWTH 5 DAYS  Final   Report Status 03/16/2017 FINAL  Final    Coagulation Studies: No results for input(s):  LABPROT, INR in the last 72 hours.  Urinalysis: No results for  input(s): COLORURINE, LABSPEC, PHURINE, GLUCOSEU, HGBUR, BILIRUBINUR, KETONESUR, PROTEINUR, UROBILINOGEN, NITRITE, LEUKOCYTESUR in the last 72 hours.  Invalid input(s): APPERANCEUR    Imaging: No results found.   Medications:   . sodium chloride 10 mL/hr at 03/16/17 1634  . sodium chloride    . sodium chloride    . sodium chloride    . sodium chloride    . cefTAZidime (FORTAZ)  IV    . vancomycin     . calcium carbonate (dosed in mg elemental calcium)  250 mg of elemental calcium Oral TID  . carvedilol  12.5 mg Oral BID WC  . chlorhexidine gluconate (MEDLINE KIT)  15 mL Mouth Rinse BID  . darbepoetin (ARANESP) injection - DIALYSIS  200 mcg Intravenous Q Tue-HD  . feeding supplement (NEPRO CARB STEADY)  1,000 mL Per Tube Q24H  . feeding supplement (PRO-STAT SUGAR FREE 64)  60 mL Per Tube QID  . [name]'s butt cream   Topical QID  . heparin  5,000 Units Subcutaneous Q8H  . insulin aspart  0-15 Units Subcutaneous Q4H  . mouth rinse  15 mL Mouth Rinse QID  . multivitamin  1 tablet Oral QHS  . pantoprazole sodium  40 mg Per Tube Q24H  . sodium chloride flush  10-40 mL Intracatheter Q12H   sodium chloride, sodium chloride, sodium chloride, sodium chloride, acetaminophen (TYLENOL) oral liquid 160 mg/5 mL, acetaminophen **OR** acetaminophen, albuterol, fentaNYL (SUBLIMAZE) injection, heparin, HYDROmorphone (DILAUDID) injection, metoCLOPramide **OR** metoCLOPramide (REGLAN) injection, metoprolol tartrate, ondansetron **OR** ondansetron (ZOFRAN) IV, sodium chloride flush  Assessment/ Plan:  1. 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 VDRF: trach- dependent, off vent now 3. 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 ESRD - HD TTS, 4h 69mn 5. Anemia - HGB 7.6 . Cont ESA. No Fe D/T osteo.  6. Secondary hyperparathyroidism - Ca 8.7  Phos 3.9, s/pCa++ supp, now on250 tid CaCO3  7. HTN/volume - BP's ok, cont coreg at 12.5 bid  (25 bid at home), holding home isosorbide 8. Nutrition: Albumin 2.2 On tube feeding prostat/nepro  9. DM: per primary 10. NICM H/O diastolic/systolic HF   LOS: 31 Montez Cuda W _0 _1 :44 AM

## 2017-03-17 NOTE — Progress Notes (Signed)
PROGRESS NOTE        PATIENT DETAILS Name: [name] Age: 36 y.o. Sex: female Date of Birth: 1981/05/02 Admit Date: 02/14/2017 Admitting Physician Lady Deutscher, MD [id number], Helene Kelp, FNP  Note-care assumed by me on 6/13  Brief Narrative: Patient is a 36 y.o. female with history of ESRD on HD, poorly controlled diabetes, history of bilateral transmetatarsal foot amputation with subsequent left foot wound dehiscence (refused BKA as outpatient), chronic systolic heart failure felt to be secondary to nonischemic cardiomyopathy admitted on 02/14/17 with wound dehiscence and persistent drainage from the left trans-metatarsal amputation site, she was admitted to the [facility]. She was subsequently found to be unresponsive in her room on 5/19 after getting IV Dilaudid-she was  in asystole, CPR was started, with ROSC in around 9 minutes.She was then transferred to the intensive care unit, and subsequently underwent cooling and then rewarming. She was unable to be liberated off the ventilator, and as a result underwent tracheostomy on 5/31. Patient has been evaluated by neurology during this hospital stay, per neurology chances of meaningful neurological recovery is dismal. Hospital course has now been complicated by persistent leukocytosis, respiratory failure, and wound dehiscence with gangrene causing persistent SIRS pathophysiology. 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 Tenacious thick mucous secretions noted. Continue attempts to wean/liberate off ventilator, PCCM following-tracheostomy in place. Critical care evaluated patient 6/18 recommend maintaining current Trach  Size. Trach  care per protocol. If improvement continues, and she is able to tolerate more weaning trial, she may be ready for Swallow study and PMV training, hold off on PEG for now. Will repeat chest x-ray 6/19. Explained to the mother in detail that respiratory status tenuous , does not allow downsizing her trach  Cardiac arrest on 5/19 with anoxic brain injury: She seems to be slowly improving, following commands.  Note-Evaluated by neurology on 5/29-felt to have poor overall prognoses for any meaningful recovery.    Systemic inflammatory response syndrome:/Persistent leukocytosis Continues to be intermittently febrile (low-grade), tachycardic-and has persistent leukocytosis white count slightly improved from 23.3-> 19.4. Likely secondary to left foot gangrene/infection and abscess-she is now status post AKA and still continues to have these symptoms. Blood cultures on 6/1, 6/13 continue to be negative. . Chest x-ray on 6/13 negative for pneumonia. C. difficile studies on 6/6 negative . Patient does have diarrhea and has a rectal tube in place. Marland Kitchen She was on vancomycin and Tressie Ellis that was started on 6/14, and subsequently discontinued on 6/16 after AKA. Spoke with Dr Comer-ID on call, who has evaluated this patient in the past-he recommends that we resume her vancomycin and Fortaz-and continue for 5 days from 6/15 when she had a AKA  through 6/20. Fever has improved off any sedation of antibiotics, consult infectious disease if no improvement.   Left trans-metatarsal stump wound dehiscence with infection and gangrene: Per operative note-pus/abscess noted extending up to the knee when BKA was attempted, subsequently underwent a AKA. Note, initially family was very reluctant to proceed with amputation, but given persistent leukocytosis/fever and potential risk of sepsis, family agreed for amputation, subsequently AKA was done on  6/15.  She  has a wound VAC in place which will be continued for 1 week postoperatively  until 6/22  ESRD: Nephrology following-HD on TTS.   Anemia: Hemoglobin 7.5-9.0  Likely secondary to chronic disease-probably worsened by acute illness. No signs of bleeding, hemoglobin has been stable around 7.6   DM-2: CBGs stable-continue with SSI.   Chronic systolic heart failure/nonischemic cardiomyopathy (EF 30-35% by TEE on 6/4): Volume status remained stable-this is managed with dialysis-continue with Coreg.   Pericardial effusion: Seen on TEE-with no evidence of tamponade pathophysiology. Repeat echo at some point in the next few weeks.  Moderate protein calorie malnutrition: Continue NG tube feedings  Hypokalemia-nephrology following  Goals of care: Unfortunate 35-year-old female with ESRD, cardiomyopathy, bilateral transmetatarsal amputations complicated by left stump dehiscence-suffered a cardiac arrest on admission-now with anoxic brain injury-ventilator dependent-tracheostomy in place, awaiting  initiation of oral feeding. Hospital course has been prolonged-complicated by failure to wean from the ventilator, persistent systemic inflammatory response syndrome due to left foot gangrene/infection-after extensive discussion with family-she remains a full code, with family choosing to continue with full aggressive care. 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 Her prognosis is still very guarded at this time, will await attempts to see if she can be liberated off the ventilator. Family is aware of the risk of decompensation and deterioration.     Telemetry (independently reviewed): Sinus tachycardia  Studies: Lower extremity ultrasound 5/18 >> no evidence of DVT CT head 5/19 >> normal exam Echo 5/20 >> EF 30-35%, increased LVF. Diffuse hypokinesis, akinesis of basilar mid-inferior myocardium, grade 2 diastolic dysfxn  EEG 5/20 >> finding c/w mod to severe global cerebral dysfxn. C/w anoxic injury given clinical course  LE US 5/21 >> negative MRI brain 5/24 >>  ?thrombosed cortical vein, otherwise normal TEE 6/4 >> mild MR, normal AV, mild TR, mild PR, EF 30-35%, diffuse hypokinesis, no thrombus, no PFO, normal RV, moderate pericardial effusion with synechia suggesting some chronicity, no vegetations   Echo (reviewed):EF 30-35% on TEE done on 6/4  Morning labs/Imaging ordered: yes  DVT Prophylaxis: Prophylactic Heparin   Code Status: Full code   Family Communication: Mother at bedside  Disposition Plan: Remain inpatient in SDU  Antimicrobial agents: Anti-infectives    Start     Dose/Rate Route Frequency Ordered Stop   03/17/17 1800  cefTAZidime (FORTAZ) 2 g in dextrose 5 % 50 mL IVPB     2 g 100 mL/hr over 30 Minutes Intravenous Every T-Th-Sa (1800) 03/15/17 1143     03/17/17 1200  vancomycin (VANCOCIN) IVPB 1000 mg/200 mL premix     1,000 mg 200 mL/hr over 60 Minutes Intravenous Every T-Th-Sa (Hemodialysis) 03/15/17 1143     03/14/17 1200  vancomycin (VANCOCIN) IVPB 1000 mg/200 mL premix  Status:  Discontinued     1,000 mg 200 mL/hr over 60 Minutes Intravenous Every T-Th-Sa (Hemodialysis) 03/12/17 0912 03/14/17 1542   03/13/17 1430  ceFAZolin (ANCEF) IVPB 2g/100 mL premix  Status:  Discontinued     2 g 200 mL/hr over 30 Minutes Intravenous To ShortStay Surgical 03/12/17 1208 03/13/17 1113   03/13/17 1115  ceFAZolin (ANCEF) IVPB 2g/100 mL premix     2 g 200 mL/hr over 30 Minutes Intravenous To Surgery 03/13/17 1108 03/13/17 1506   03/12/17 1800  cefTAZidime (FORTAZ) 2 g in dextrose 5 % 50 mL IVPB  Status:  Discontinued     2 g 100 mL/hr over 30 Minutes Intravenous Every T-Th-Sa (1800) 03/12/17   0912 03/14/17 1542   03/12/17 1200  vancomycin (VANCOCIN) 1,500 mg in sodium chloride 0.9 % 250 mL IVPB     1,500 mg 250 mL/hr over 60 Minutes Intravenous Every Thu (Hemodialysis) 03/12/17 0912 03/12/17 1356   03/02/17 1200  cefTRIAXone (ROCEPHIN) 2 g in dextrose 5 % 50 mL IVPB     2 g 100 mL/hr over 30 Minutes Intravenous Every 24 hours  03/02/17 0810 03/06/17 1358   03/02/17 1200  vancomycin (VANCOCIN) IVPB 1000 mg/200 mL premix     1,000 mg 200 mL/hr over 60 Minutes Intravenous Every M-W-F (Hemodialysis) 03/02/17 0815 03/02/17 1457   03/02/17 1200  metroNIDAZOLE (FLAGYL) IVPB 500 mg     500 mg 100 mL/hr over 60 Minutes Intravenous Every 8 hours 03/02/17 0948 03/06/17 2230   02/26/17 1200  vancomycin (VANCOCIN) IVPB 1000 mg/200 mL premix     1,000 mg 200 mL/hr over 60 Minutes Intravenous Every T-Th-Sa (Hemodialysis) 02/25/17 1151 03/05/17 1325   02/25/17 1200  vancomycin (VANCOCIN) 2,000 mg in sodium chloride 0.9 % 500 mL IVPB     2,000 mg 250 mL/hr over 120 Minutes Intravenous  Once 02/25/17 1149 02/25/17 1449   02/25/17 0900  cefTRIAXone (ROCEPHIN) 2 g in dextrose 5 % 50 mL IVPB  Status:  Discontinued     2 g 100 mL/hr over 30 Minutes Intravenous Every 24 hours 02/25/17 0814 03/02/17 0810   02/25/17 0900  metroNIDAZOLE (FLAGYL) IVPB 500 mg  Status:  Discontinued     500 mg 100 mL/hr over 60 Minutes Intravenous Every 8 hours 02/25/17 0814 03/02/17 0948   02/20/17 1200  vancomycin (VANCOCIN) IVPB 1000 mg/200 mL premix  Status:  Discontinued     1,000 mg 200 mL/hr over 60 Minutes Intravenous Every M-W-F (Hemodialysis) 02/19/17 1506 02/20/17 1403   02/17/17 1800  meropenem (MERREM) 500 mg in sodium chloride 0.9 % 50 mL IVPB  Status:  Discontinued     500 mg 100 mL/hr over 30 Minutes Intravenous Daily-1800 02/17/17 1026 02/25/17 0814   02/17/17 1200  vancomycin (VANCOCIN) IVPB 1000 mg/200 mL premix     1,000 mg 200 mL/hr over 60 Minutes Intravenous Every T-Th-Sa (Hemodialysis) 02/14/17 1758 02/19/17 1612   02/14/17 2200  clindamycin (CLEOCIN) IVPB 900 mg  Status:  Discontinued     900 mg 100 mL/hr over 30 Minutes Intravenous Every 8 hours 02/14/17 1106 02/14/17 1928   02/14/17 2000  clindamycin (CLEOCIN) IVPB 900 mg  Status:  Discontinued     900 mg 100 mL/hr over 30 Minutes Intravenous Every 8 hours 02/14/17 1928  02/16/17 1047   02/14/17 1915  piperacillin-tazobactam (ZOSYN) IVPB 3.375 g  Status:  Discontinued     3.375 g 100 mL/hr over 30 Minutes Intravenous Every 12 hours 02/14/17 1801 02/17/17 1026   02/14/17 1845  vancomycin (VANCOCIN) 2,000 mg in sodium chloride 0.9 % 500 mL IVPB     2,000 mg 250 mL/hr over 120 Minutes Intravenous  Once 02/14/17 1758 02/14/17 2107   02/14/17 1115  clindamycin (CLEOCIN) IVPB 900 mg  Status:  Discontinued     900 mg 100 mL/hr over 30 Minutes Intravenous  Once 02/14/17 1106 02/14/17 2236   02/14/17 0945  clindamycin (CLEOCIN) IVPB 600 mg     600 mg 100 mL/hr over 30 Minutes Intravenous  Once 02/14/17 0931 02/14/17 1013      Procedures: ETT 5/19 >> 5/31 Lt IJ CVL 5/19 >> out Trach 5/31 >>  TEE 6/4 >> mild MR, normal AV, mild   TR, mild PR, EF 30-35%, diffuse hypokinesis, no thrombus, no PFO, normal RV, moderate pericardial effusion with synechia suggesting some chronicity, no vegetations  Rt IJ TLC 6/13>> Left AKA 6/15>>  CONSULTS:  Infectious disease:  Neurology  Gastroenterology  Wound care  PCCM  Nephrology  Orthopedics  Time spent: 30 minutes-Greater than 50% of this time was spent in counseling, explanation of diagnosis, planning of further management, and coordination of care.  MEDICATIONS: Scheduled Meds: . calcium carbonate (dosed in mg elemental calcium)  250 mg of elemental calcium Oral TID  . carvedilol  12.5 mg Oral BID WC  . chlorhexidine gluconate (MEDLINE KIT)  15 mL Mouth Rinse BID  . darbepoetin (ARANESP) injection - DIALYSIS  200 mcg Intravenous Q Tue-HD  . feeding supplement (NEPRO CARB STEADY)  1,000 mL Per Tube Q24H  . feeding supplement (PRO-STAT SUGAR FREE 64)  60 mL Per Tube QID  . [name]'s butt cream   Topical QID  . heparin  5,000 Units Subcutaneous Q8H  . insulin aspart  0-15 Units Subcutaneous Q4H  . mouth rinse  15 mL Mouth Rinse QID  . multivitamin  1 tablet Oral QHS  . pantoprazole sodium  40 mg Per Tube  Q24H  . sodium chloride flush  10-40 mL Intracatheter Q12H   Continuous Infusions: . sodium chloride 10 mL/hr at 03/16/17 1634  . sodium chloride    . sodium chloride    . sodium chloride    . sodium chloride    . sodium chloride    . sodium chloride    . cefTAZidime (FORTAZ)  IV    . vancomycin     PRN Meds:.sodium chloride, sodium chloride, sodium chloride, sodium chloride, sodium chloride, sodium chloride, acetaminophen (TYLENOL) oral liquid 160 mg/5 mL, acetaminophen **OR** acetaminophen, albuterol, fentaNYL (SUBLIMAZE) injection, heparin, heparin, HYDROmorphone (DILAUDID) injection, lidocaine (PF), lidocaine-prilocaine, metoCLOPramide **OR** metoCLOPramide (REGLAN) injection, metoprolol tartrate, ondansetron **OR** ondansetron (ZOFRAN) IV, pentafluoroprop-tetrafluoroeth, sodium chloride flush   PHYSICAL EXAM: Vital signs: Vitals:   03/17/17 0405 03/17/17 0454 03/17/17 0800 03/17/17 0836  BP: 102/70  106/76 106/76  Pulse: (!) 114 (!) 107 (!) 106   Resp: 16  16 16  Temp: 99 F (37.2 C)  99.9 F (37.7 C)   TempSrc: Oral  Oral   SpO2: 100% 100% 100% 100%  Weight: 87.1 kg (192 lb)     Height:       Filed Weights   03/15/17 0459 03/16/17 0338 03/17/17 0405  Weight: 85.3 kg (188 lb) 88 kg (194 lb) 87.1 kg (192 lb)   Body mass index is 28.77 kg/m.  General appearance:Sleeping when I walked in-easily arouses, squeeze my hands. Eyes: No scleral icterus HEENT: Atraumatic and Normocephalic Neck: supple, tracheostomy in place Resp:Good air entry bilaterally, some transmitted upper airway sounds CVS: S1 S2 regular, tachycardic GI: Bowel sounds present, Non tender and not distended with no gaurding, rigidity or rebound. Extremities: Left AKA stump-vac in place. Musculoskeletal:No digital cyanosis  I have personally reviewed following labs and imaging studies  LABORATORY DATA: CBC:  Recent Labs Lab 03/11/17 0842 03/12/17 0702 03/13/17 0557 03/14/17 0449 03/15/17 0455  03/16/17 0444 03/17/17 0405  WBC 28.5* 24.0* 23.4* 22.8* 24.9* 23.3* 19.4*  NEUTROABS 23.0* 20.2*  --  19.6*  --   --   --   HGB 9.1* 8.9* 8.1* 7.6* 7.5* 7.6* 7.6*  HCT 30.5* 29.5* 27.2* 25.7* 25.5* 25.4* 26.1*  MCV 97.4 96.7 97.5 96.6 98.1 95.5 95.3  PLT 377 380 363 334 359   371 402*    Basic Metabolic Panel:  Recent Labs Lab 03/11/17 0842 03/13/17 0557 03/14/17 0449 03/15/17 0455 03/17/17 0405  NA 140 136 136 135 136  K 4.0 4.1 4.4 3.5 3.1*  CL 98* 96* 97* 95* 97*  CO2 27 26 23 27 24  GLUCOSE 175* 172* 146* 157* 175*  BUN 74* 45* 63* 29* 95*  CREATININE 4.65* 3.78* 5.26* 3.17* 5.81*  CALCIUM 9.3 9.0 8.6* 8.3* 8.7*  PHOS 3.4 3.0 5.1* 3.9  --     GFR: Estimated Creatinine Clearance: 15.5 mL/min (A) (by C-G formula based on SCr of 5.81 mg/dL (H)).  Liver Function Tests:  Recent Labs Lab 03/11/17 0842 03/13/17 0557 03/14/17 0449 03/15/17 0455 03/17/17 0405  AST  --   --   --   --  21  ALT  --   --   --   --  6*  ALKPHOS  --   --   --   --  227*  BILITOT  --   --   --   --  1.5*  PROT  --   --   --   --  6.3*  ALBUMIN 2.5* 2.4* 2.4* 2.2* 2.1*   No results for input(s): LIPASE, AMYLASE in the last 168 hours. No results for input(s): AMMONIA in the last 168 hours.  Coagulation Profile: No results for input(s): INR, PROTIME in the last 168 hours.  Cardiac Enzymes: No results for input(s): CKTOTAL, CKMB, CKMBINDEX, TROPONINI in the last 168 hours.  BNP (last 3 results) No results for input(s): PROBNP in the last 8760 hours.  HbA1C: No results for input(s): HGBA1C in the last 72 hours.  CBG:  Recent Labs Lab 03/16/17 1711 03/16/17 1920 03/16/17 2325 03/17/17 0403 03/17/17 0825  GLUCAP 137* 135* 156* 169* 159*    Lipid Profile: No results for input(s): CHOL, HDL, LDLCALC, TRIG, CHOLHDL, LDLDIRECT in the last 72 hours.  Thyroid Function Tests: No results for input(s): TSH, T4TOTAL, FREET4, T3FREE, THYROIDAB in the last 72 hours.  Anemia  Panel: No results for input(s): VITAMINB12, FOLATE, FERRITIN, TIBC, IRON, RETICCTPCT in the last 72 hours.  Urine analysis:    Component Value Date/Time   COLORURINE AMBER (A) 02/14/2017 2111   APPEARANCEUR HAZY (A) 02/14/2017 2111   LABSPEC 1.015 02/14/2017 2111   PHURINE 5.0 02/14/2017 2111   GLUCOSEU NEGATIVE 02/14/2017 2111   HGBUR SMALL (A) 02/14/2017 2111   BILIRUBINUR NEGATIVE 02/14/2017 2111   KETONESUR NEGATIVE 02/14/2017 2111   PROTEINUR 30 (A) 02/14/2017 2111   UROBILINOGEN 0.2 09/21/2014 1908   NITRITE NEGATIVE 02/14/2017 2111   LEUKOCYTESUR TRACE (A) 02/14/2017 2111    Sepsis Labs: Lactic Acid, Venous    Component Value Date/Time   LATICACIDVEN 4.6 (HH) 02/14/2017 1807    MICROBIOLOGY: Recent Results (from the past 240 hour(s))  Culture, blood (routine x 2)     Status: None   Collection Time: 03/11/17  8:42 AM  Result Value Ref Range Status   Specimen Description BLOOD RIGHT HAND  Final   Special Requests   Final    BOTTLES DRAWN AEROBIC ONLY Blood Culture adequate volume   Culture NO GROWTH 5 DAYS  Final   Report Status 03/16/2017 FINAL  Final  Culture, blood (routine x 2)     Status: None   Collection Time: 03/11/17  8:46 AM  Result Value Ref Range Status   Specimen Description BLOOD RIGHT HAND  Final   Special Requests   Final      BOTTLES DRAWN AEROBIC AND ANAEROBIC Blood Culture adequate volume   Culture NO GROWTH 5 DAYS  Final   Report Status 03/16/2017 FINAL  Final    RADIOLOGY STUDIES/RESULTS: Ct Abdomen Wo Contrast  Result Date: 03/02/2017 CLINICAL DATA:  Preop for gastrostomy to EXAM: CT ABDOMEN WITHOUT CONTRAST TECHNIQUE: Multidetector CT imaging of the abdomen was performed following the standard protocol without IV contrast. COMPARISON:  None. FINDINGS: Lower chest: Bibasilar dependent and medial consolidation. Large pericardial effusion. Hepatobiliary: The left lobe of the liver is prominent and anterior to the stoma. The gallbladder is not  clearly visualized. It may either be decompressed or absent due to cholecystectomy. Pancreas: Unremarkable Spleen: Unremarkable Adrenals/Urinary Tract: Kidneys and adrenal glands are within normal limits. Stomach/Bowel: The stomach is positioned deep to the liver and colon. Gastrostomy tube placement may be problematic. No evidence of small-bowel obstruction. Feeding tube tip is in the proximal duodenum. Vascular/Lymphatic: Small para-aortic lymph nodes. Atherosclerotic vascular calcifications are noted. No evidence of aortic aneurysm. Other: No free-fluid. Musculoskeletal: No vertebral compression deformity. IMPRESSION: The stomach is positioned deep to a prominent left lobe of the liver as well as the transverse colon. Gas is distension of the stomach and barium opacification of the transverse colon will be essential during the procedure. Bibasilar pulmonary consolidation. Large pericardial effusion. Electronically Signed   By: Marybelle Killings M.D.   On: 03/02/2017 07:18   Mr Brain Wo Contrast  Result Date: 02/18/2017 CLINICAL DATA:  Anoxic brain injury status post cardiac arrest EXAM: MRI HEAD WITHOUT CONTRAST TECHNIQUE: Multiplanar, multiecho pulse sequences of the brain and surrounding structures were obtained without intravenous contrast. IV contrast could not be administered due to the patient's poor renal function. COMPARISON:  Head CT 02/14/2017 FINDINGS: Brain: The midline structures are normal. There is no focal diffusion restriction to indicate acute infarct. The brain parenchymal signal is normal and there is no mass lesion. There is a focal susceptibility abnormality at the left insula (series 12, image 55). Brain volume is normal for age without age-advanced or lobar predominant atrophy. The dura is normal and there is no extra-axial collection. Vascular: Major intracranial arterial and venous sinus flow voids are preserved. Skull and upper cervical spine: The visualized skull base, calvarium, upper  cervical spine and extracranial soft tissues are normal. Sinuses/Orbits: No fluid levels or advanced mucosal thickening. No mastoid effusion. Normal orbits. IMPRESSION: 1. Focus of susceptibility in the left insula may indicate a thrombosed cortical vein. The location does not correspond to a large artery. 2. No associated ischemia or hemorrhage.  Otherwise normal brain. Electronically Signed   By: Ulyses Jarred M.D.   On: 02/18/2017 14:55   Ir Fluoro Guide Cv Line Right  Result Date: 03/11/2017 INDICATION: Osteomyelitis EXAM: TUNNELED RIGHT JUGULAR PICC LINE PLACEMENT WITH ULTRASOUND AND FLUOROSCOPIC GUIDANCE MEDICATIONS: None. ANESTHESIA/SEDATION: None FLUOROSCOPY TIME:  Fluoroscopy Time:  minutes 30 seconds (1 mGy). COMPLICATIONS: None immediate. PROCEDURE: The patient was advised of the possible risks and complications and agreed to undergo the procedure. The patient was then brought to the angiographic suite for the procedure. The right neck was prepped with chlorhexidine, draped in the usual sterile fashion using maximum barrier technique (cap and mask, sterile gown, sterile gloves, large sterile sheet, hand hygiene and cutaneous antiseptic). Local anesthesia was attained by infiltration with 1% lidocaine. Ultrasound demonstrated patency of the right jugular vein, and this was documented with an image. Under real-time ultrasound guidance, this vein was accessed with a 21 gauge micropuncture needle and image documentation  was performed. A long subcutaneous tract was employed. The needle was exchanged over a guidewire for a peel-away sheath through which a 26 cm 5 [place] double lumen power injectable PICC was advanced, and positioned with its tip at the lower SVC/right atrial junction. The cuff was positioned in the subcutaneous tract. Fluoroscopy during the procedure and fluoro spot radiograph confirms appropriate catheter position. The catheter was flushed, secured to the skin with Prolene sutures, and  covered with a sterile dressing. IMPRESSION: Successful placement of a tunneled right jugular PICC with sonographic and fluoroscopic guidance. The catheter is ready for use. Electronically Signed   By: Marybelle Killings M.D.   On: 03/11/2017 15:31   Ir US Guide Vasc Access Right  Result Date: 03/11/2017 INDICATION: Osteomyelitis EXAM: TUNNELED RIGHT JUGULAR PICC LINE PLACEMENT WITH ULTRASOUND AND FLUOROSCOPIC GUIDANCE MEDICATIONS: None. ANESTHESIA/SEDATION: None FLUOROSCOPY TIME:  Fluoroscopy Time:  minutes 30 seconds (1 mGy). COMPLICATIONS: None immediate. PROCEDURE: The patient was advised of the possible risks and complications and agreed to undergo the procedure. The patient was then brought to the angiographic suite for the procedure. The right neck was prepped with chlorhexidine, draped in the usual sterile fashion using maximum barrier technique (cap and mask, sterile gown, sterile gloves, large sterile sheet, hand hygiene and cutaneous antiseptic). Local anesthesia was attained by infiltration with 1% lidocaine. Ultrasound demonstrated patency of the right jugular vein, and this was documented with an image. Under real-time ultrasound guidance, this vein was accessed with a 21 gauge micropuncture needle and image documentation was performed. A long subcutaneous tract was employed. The needle was exchanged over a guidewire for a peel-away sheath through which a 26 cm 5 [place] double lumen power injectable PICC was advanced, and positioned with its tip at the lower SVC/right atrial junction. The cuff was positioned in the subcutaneous tract. Fluoroscopy during the procedure and fluoro spot radiograph confirms appropriate catheter position. The catheter was flushed, secured to the skin with Prolene sutures, and covered with a sterile dressing. IMPRESSION: Successful placement of a tunneled right jugular PICC with sonographic and fluoroscopic guidance. The catheter is ready for use. Electronically Signed   By:  Marybelle Killings M.D.   On: 03/11/2017 15:31   Dg Chest Port 1 View  Result Date: 03/11/2017 CLINICAL DATA:  Leukocytosis. EXAM: PORTABLE CHEST 1 VIEW COMPARISON:  03/03/2017 FINDINGS: Patient is rotated to the left. The cardio pericardial silhouette is enlarged. Slight improvement in left base aeration. Tracheostomy tube remains in place. A feeding tube passes into the stomach although the distal tip position is not included on the film. The visualized bony structures of the thorax are intact. Telemetry leads overlie the chest. IMPRESSION: Persistent enlargement of the cardiopericardial silhouette without evidence for overt pulmonary edema or substantial pleural effusion. Electronically Signed   By: Misty Stanley M.D.   On: 03/11/2017 08:37   Dg Chest Port 1 View  Result Date: 03/03/2017 CLINICAL DATA:  Respiratory failure. EXAM: PORTABLE CHEST 1 VIEW COMPARISON:  03/01/2017 . FINDINGS: Tracheostomy tube and feeding tube in stable position. Cardiomegaly with mild diffuse interstitial prominence suggesting mild CHF. Persistent atelectasis and consolidation left lower lobe. Small left pleural effusion cannot be excluded. No pneumothorax. IMPRESSION: 1. Tracheostomy tube and feeding tube in stable position. 2. Cardiomegaly with diffuse mild interstitial prominence suggesting mild CHF. 3. Persistent left lower lobe atelectasis and consolidation. Small left pleural effusion cannot be excluded . Electronically Signed   By: Marcello Moores  Register   On: 03/03/2017 07:09   Dg Chest  Port 1 View  Result Date: 03/01/2017 CLINICAL DATA:  Respiratory failure EXAM: PORTABLE CHEST 1 VIEW COMPARISON:  02/27/2017 FINDINGS: 0546 hours. Patient rotated to the left. Tracheostomy tube remains in place. A feeding tube passes into the stomach although the distal tip position is not included on the film. The cardio pericardial silhouette is enlarged. Left base collapse/consolidation again noted. There is pulmonary vascular congestion  without overt pulmonary edema. IMPRESSION: Rotated film with cardiomegaly and vascular congestion. Persistent left base collapse/ consolidation. Electronically Signed   By: Misty Stanley M.D.   On: 03/01/2017 07:43   Dg Chest Port 1 View  Result Date: 02/27/2017 CLINICAL DATA:  Hypoxia. EXAM: PORTABLE CHEST 1 VIEW COMPARISON:  02/26/2017. FINDINGS: Tracheostomy to in stable position. Stable cardiomegaly. Low lung volumes. Progressive left lower lobe atelectasis and infiltrate. Small left pleural effusion cannot be excluded . IMPRESSION: 1. Tracheostomy tube in stable position. 2. Progressive left lower lobe atelectasis and infiltrate. Small left pleural effusion cannot be excluded . Electronically Signed   By: Marcello Moores  Register   On: 02/27/2017 07:01   Dg Chest Port 1 View  Result Date: 02/26/2017 CLINICAL DATA:  Status post tracheostomy placement. EXAM: PORTABLE CHEST 1 VIEW COMPARISON:  Earlier today. FINDINGS: The endotracheal tube has been removed and replaced with a tracheostomy tube in satisfactory position. The nasogastric tube and esophageal probe have been removed. The left jugular catheter has been removed. No pneumothorax. Grossly stable enlarged cardiac silhouette. Left lower lobe opacity with improvement laterally since 02/25/2017. Clear right lung. Unremarkable bones. IMPRESSION: 1. Tracheostomy tube in satisfactory position. 2. Left lower lobe atelectasis or pneumonia with mild improvement since 2 days ago. 3. Stable cardiomegaly. Electronically Signed   By: Claudie Revering M.D.   On: 02/26/2017 16:02   Dg Chest Port 1 View  Result Date: 02/26/2017 CLINICAL DATA:  Hypoxia.  Shortness of breath. EXAM: PORTABLE CHEST 1 VIEW COMPARISON:  02/25/2017. FINDINGS: Endotracheal tube, NG tube, left IJ line esophageal probe in stable position. Cardiomegaly with bilateral pulmonary interstitial infiltrates consistent with CHF. Left base atelectasis . Small left pleural effusion. Similar findings noted on  prior exam. No pneumothorax. IMPRESSION: 1. Lines and tubes in stable position. 2. Cardiomegaly with bilateral pulmonary interstitial prominence and small left pleural effusion noted consistent CHF. Left base atelectasis. Similar findings noted on prior exam. Electronically Signed   By: [place]   On: 02/26/2017 07:18   Dg Chest Port 1 View  Result Date: 02/25/2017 CLINICAL DATA:  Respiratory failure, intubated patient. Diabetes, cardiomyopathy, morbid obesity, end-stage renal disease. EXAM: PORTABLE CHEST 1 VIEW COMPARISON:  Portable chest x-ray of Feb 24, 2017 FINDINGS: The lungs are adequately inflated. The pulmonary interstitial markings are slightly less prominent today. The pulmonary vascularity remains engorged. The cardiac silhouette remains enlarged. Retrocardiac region on the left is slightly less dense. The endotracheal tube tip lies 5.7 cm above the carina. The esophagogastric tube tip projects below the inferior margin of the image. The left internal jugular venous catheter tip projects over the proximal SVC. IMPRESSION: Slight interval improvement in pulmonary interstitial edema. Stable cardiomegaly. Persistent left lower lobe atelectasis or pneumonia. The support tubes are in reasonable position. Electronically Signed   By: David  Martinique M.D.   On: 02/25/2017 07:57   Dg Chest Port 1 View  Result Date: 02/24/2017 CLINICAL DATA:  Respiratory failure EXAM: PORTABLE CHEST 1 VIEW COMPARISON:  Two days ago FINDINGS: Endotracheal tube tip at the clavicular heads. An orogastric tube reaches the stomach. Esophageal thermistor.  Left IJ central line with tip at the SVC level. Cardiomegaly and diffuse hazy opacity with cephalized blood flow. No definitive effusion. No pneumothorax. IMPRESSION: 1. Stable positioning of tubes and central line. 2. CHF pattern. Electronically Signed   By: Jonathon  Watts M.D.   On: 02/24/2017 07:31   Dg Chest Port 1 View  Result Date: 02/22/2017 CLINICAL DATA:   Respiratory failure EXAM: PORTABLE CHEST 1 VIEW COMPARISON:  02/21/2017 FINDINGS: Endotracheal tube terminates 5 cm above the carina. Cardiomegaly with mild interstitial edema. Retrocardiac opacity, possibly reflecting a combination of atelectasis and pleural effusion, pneumonia not excluded. No pneumothorax. Left IJ venous catheter terminates at the cavoatrial junction. Enteric tube courses into the stomach. IMPRESSION: Endotracheal tube terminates 5 cm above the carina. Cardiomegaly with mild interstitial edema. Retrocardiac opacity, possibly atelectasis and pleural effusion, pneumonia not excluded. Electronically Signed   By: Sriyesh  Krishnan M.D.   On: 02/22/2017 07:30   Dg Chest Port 1 View  Result Date: 02/21/2017 CLINICAL DATA:  Endotracheal tube. EXAM: PORTABLE CHEST 1 VIEW COMPARISON:  02/19/2017 FINDINGS: Endotracheal tube terminates approximately 5 cm above the carina. Enteric tube courses into the left upper abdomen with tip not imaged. Left jugular catheter terminates over the mid SVC. The cardiac silhouette remains enlarged. Pulmonary vascular congestion and bilateral parenchymal lung opacities have not significantly changed. No large pleural effusion or pneumothorax is identified. IMPRESSION: Unchanged pulmonary edema. Electronically Signed   By: Allen  Grady M.D.   On: 02/21/2017 07:42   Dg Chest Port 1 View  Result Date: 02/19/2017 CLINICAL DATA:  35-year-old female with respiratory failure. EXAM: PORTABLE CHEST 1 VIEW COMPARISON:  02/18/2017 and prior radiograph FINDINGS: An endotracheal tube with tip 3.5 cm above the carina, left IJ central venous catheter with tip overlying the lower SVC, and NG tube entering the stomach with tip off the field of view again noted. Pulmonary edema has decreased from the prior study. Continued left lower lung atelectasis/ consolidation again noted. There is no evidence of pneumothorax. No other changes are noted. IMPRESSION: Decreased pulmonary edema,  otherwise unchanged appearance of the chest. Electronically Signed   By: Jeffrey  Hu M.D.   On: 02/19/2017 08:03   Dg Chest Port 1 View  Result Date: 02/18/2017 CLINICAL DATA:  Respiratory failure EXAM: PORTABLE CHEST 1 VIEW COMPARISON:  02/17/2017 FINDINGS: Cardiac shadow remains enlarged. Endotracheal tube, nasogastric catheter and esophageal probe are again seen and stable. Left jugular central line is again noted and stable. No pneumothorax is seen. Diffuse vascular congestion and increased parenchymal opacity is noted consistent with progressive CHF. No focal confluent infiltrate is noted. IMPRESSION: Progressive changes of CHF. Electronically Signed   By: Mark  Lukens M.D.   On: 02/18/2017 08:15   Dg Chest Port 1 View  Result Date: 02/17/2017 CLINICAL DATA:  Pulmonary edema EXAM: PORTABLE CHEST 1 VIEW COMPARISON:  02/16/2017 FINDINGS: Endotracheal tube, NG tube, left jugular central venous catheter are stable. Severe cardiomegaly is stable. Mild vascular congestion is stable. No consolidation. No interstitial edema. IMPRESSION: Stable cardiomegaly and mild vascular congestion. Electronically Signed   By: Arthur  Hoss M.D.   On: 02/17/2017 07:35   Dg Chest Port 1 View  Result Date: 02/16/2017 CLINICAL DATA:  Acute respiratory failure. EXAM: PORTABLE CHEST 1 VIEW COMPARISON:  Radiograph of Feb 14, 2017. FINDINGS: Stable cardiomegaly. Endotracheal and nasogastric tubes are unchanged in position. Left internal jugular catheter is unchanged with distal tip in expected position of SVC. No pneumothorax is noted. Lungs are clear. Bony thorax   is unremarkable. IMPRESSION: Stable support apparatus.  Stable cardiomegaly. Electronically Signed   By: James  Green Jr, M.D.   On: 02/16/2017 07:30   Dg Abd Portable 1v  Result Date: 03/02/2017 CLINICAL DATA:  Encounter for feeding tube placement EXAM: PORTABLE ABDOMEN - 1 VIEW COMPARISON:  Portable exam 1712 hours compared to CT abdomen and pelvis of 03/01/2017  FINDINGS: Feeding tube traverses abdomen with tip projecting over distal antrum near pylorus. Cardiac silhouette appears enlarged. Visualized bowel gas pattern normal. Osseous structures unremarkable. IMPRESSION: Tip of feeding tube projects over distal gastric antrum near pylorus. Electronically Signed   By: Mark  Boles M.D.   On: 03/02/2017 17:21   Dg Abd Portable 1v  Result Date: 02/27/2017 CLINICAL DATA:  Feeding tube placement EXAM: PORTABLE ABDOMEN - 1 VIEW COMPARISON:  None. FINDINGS: Feeding tube with the tip projecting over the antrum of the stomach. There is no bowel dilatation to suggest obstruction. There is no evidence of pneumoperitoneum, portal venous gas or pneumatosis. There are no pathologic calcifications along the expected course of the ureters. The osseous structures are unremarkable. IMPRESSION: Feeding tube with the tip projecting over the antrum of the stomach. Electronically Signed   By: Hetal  Patel   On: 02/27/2017 11:05   Us Abdomen Limited Ruq  Result Date: 02/18/2017 CLINICAL DATA:  Elevated liver function studies. EXAM: US ABDOMEN LIMITED - RIGHT UPPER QUADRANT COMPARISON:  None. FINDINGS: Gallbladder: No gallstones or wall thickening visualized. No sonographic Murphy sign noted by sonographer. Common bile duct: Diameter: 7 mm, normal Liver: Circumscribed hyperechoic lesion with central hypoechoic appearance demonstrated along the dome of the liver measuring 2.8 cm maximal diameter. This is likely to represent a cavernous hemangioma, but the appearance on ultrasound is somewhat atypical. Consider follow-up with elective MRI of the liver for better characterization. IMPRESSION: No evidence of cholelithiasis or cholecystitis. Circumscribed hyperechoic lesion in the dome of the liver measuring 2.8 cm maximal diameter. This is probably a cavernous hemangioma but appearance is somewhat atypical. Suggest elective MRI liver in follow-up for better characterization. Electronically  Signed   By: William  Stevens M.D.   On: 02/18/2017 02:10     LOS: 31 days   Nayana Abrol, MD  [facility] Pager: [phone]  If 7PM-7AM, please contact night-coverage [email or web address] Password [email or web address] 03/17/2017, 12:11 PM   

## 2017-03-18 LAB — GLUCOSE, CAPILLARY
GLUCOSE-CAPILLARY: 164 mg/dL — AB (ref 65–99)
GLUCOSE-CAPILLARY: 165 mg/dL — AB (ref 65–99)
GLUCOSE-CAPILLARY: 179 mg/dL — AB (ref 65–99)
Glucose-Capillary: 217 mg/dL — ABNORMAL HIGH (ref 65–99)
Glucose-Capillary: 155 mg/dL — ABNORMAL HIGH (ref 65–99)
Glucose-Capillary: 181 mg/dL — ABNORMAL HIGH (ref 65–99)
Glucose-Capillary: 172 mg/dL — ABNORMAL HIGH (ref 65–99)

## 2017-03-18 LAB — HEPATITIS B SURFACE ANTIGEN: Hepatitis B Surface Ag: NEGATIVE

## 2017-03-18 MED ORDER — POTASSIUM & SODIUM PHOSPHATES 280-160-250 MG PO PACK
1.0000 | PACK | Freq: Three times a day (TID) | ORAL | Status: DC
  Administered 2017-03-18 – 2017-03-21 (×11): 1
  Filled 2017-03-18 (×19): qty 1

## 2017-03-18 NOTE — NC FL2 (Signed)
[place] MEDICAID FL2 LEVEL OF CARE SCREENING TOOL     IDENTIFICATION  Patient Name: [name] Birthdate: Dec 12, 1980 Sex: female Admission Date (Current Location): 02/14/2017  [facility] and [place] Number:  Herbalist and Address:  [facility], [place] [address], [place], [place] [address]      Provider Number: [id number]  Attending Physician Name and Address:  Reyne Dumas, MD  Relative Name and Phone Number:       Current Level of Care: Hospital Recommended Level of Care: [place] Prior Approval Number:    Date Approved/Denied:   PASRR Number:  [id number]   Discharge Plan: SNF    Current Diagnoses: Patient Active Problem List   Diagnosis Date Noted  . Hypoxemia   . Abscess of left leg   . Tracheostomy status ([place])   . Anoxic brain injury ([place])   . Palliative care encounter   . Goals of care, counseling/discussion   . Tachycardia   . DNR (do not resuscitate) discussion 03/04/2017  . Palliative care by specialist 03/04/2017  . Protein calorie malnutrition ([place])   . Acute on chronic respiratory failure with hypoxemia ([place])   . Bacteremia   . Encounter for central line placement   . Elevated liver enzymes   . Jaundice   . Wound dehiscence   . Coma ([place])   . Osteomyelitis ([place]) 02/14/2017  . Pressure injury of skin 02/14/2017  . Cardiac arrest, cause unspecified ([place])   . Dehiscence of amputation stump ([place])   . Status post transmetatarsal amputation of left foot ([place]) 10/21/2016  . Status post transmetatarsal amputation of right foot ([place]) 10/08/2016  . 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 Diabetic osteomyelitis ([place])   . 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 Left foot infection 06/20/2016  . ESRD (end stage renal disease) ([place]) 06/20/2016  . Diabetic foot ulcer ([place]) 06/20/2016  . Chronic pain syndrome 02/24/2016  . Normal coronary  arteries 10/12/2015  . NSTEMI- type 2, Troponin 11.2 05/11/2015  . Acute respiratory failure ([place]) 05/10/2015  . History of arthroscopy of right shoulder-05/10/15 05/10/2015  . CKD (chronic kidney disease) stage 5, GFR less than 15 ml/min (HCC) 09/21/2014  . Unspecified asthma(493.90) 10/25/2013  . Acute combined systolic and diastolic heart failure ([place]) 09/21/2013  . Non-ischemic cardiomyopathy - by echo 8/16- EF 35-40% 09/16/2013  . Morbid obesity-  09/16/2013  . Uncontrolled type 2 diabetes with renal manifestation ([place]) 09/15/2013  . Normocytic anemia 09/15/2013  . Lower extremity edema 09/15/2013  . Hyperlipidemia   . Hypertension     Orientation RESPIRATION BLADDER Height & Weight      (DOx4)  Tracheostomy, Vent, O2 (36m shiley cuffed trach during the day and vent at night) Incontinent Weight: 182 lb 15.7 oz (83 kg) Height:  5' 8.5" (174 cm)  BEHAVIORAL SYMPTOMS/MOOD NEUROLOGICAL BOWEL NUTRITION STATUS      Incontinent (rectal tube) Diet (will have PEG tube placed prior to discharge)  AMBULATORY STATUS COMMUNICATION OF NEEDS Skin   Total Care Verbally PU Stage and Appropriate Care   PU Stage 2 Dressing:  (anus- open air)                   Personal Care Assistance Level of Assistance  Bathing, Dressing, Feeding Bathing Assistance: Maximum assistance Feeding assistance: Maximum assistance Dressing Assistance: Maximum assistance     Functional Limitations Info  Sight, Hearing, Speech Sight Info:  Adequate Hearing Info: Adequate Speech Info: Impaired    SPECIAL CARE FACTORS FREQUENCY  PT (By licensed PT), OT (By licensed OT)     PT Frequency: 5/wk OT Frequency: 5/wk            Contractures Contractures Info: Not present    Additional Factors Info  Code Status, Allergies, Isolation Precautions Code Status Info: FULL Allergies Info: Aspirin, Sulfur, Tramadol, Gabapentin     Isolation Precautions Info: Enteric     Current Medications (03/18/2017):  This  is the current hospital active medication list Current Facility-Administered Medications  Medication Dose Route Frequency Provider Last Rate Last Dose  . 0.9 %  sodium chloride infusion   Intravenous Continuous Erick Colace, NP 10 mL/hr at 03/16/17 1634    . 0.9 %  sodium chloride infusion  100 mL Intravenous PRN Roney Jaffe, MD      . 0.9 %  sodium chloride infusion  100 mL Intravenous PRN Roney Jaffe, MD      . 0.9 %  sodium chloride infusion  100 mL Intravenous PRN Roney Jaffe, MD      . 0.9 %  sodium chloride infusion  100 mL Intravenous PRN Roney Jaffe, MD      . 0.9 %  sodium chloride infusion  100 mL Intravenous PRN Ejigiri, Thomos Lemons, PA-C      . 0.9 %  sodium chloride infusion  100 mL Intravenous PRN Ejigiri, Thomos Lemons, PA-C      . acetaminophen (TYLENOL) solution 650 mg  650 mg Per Tube Q6H PRN Jamal Maes, MD   650 mg at 03/16/17 0455  . acetaminophen (TYLENOL) tablet 650 mg  650 mg Oral Q6H PRN Newt Minion, MD       Or  . acetaminophen (TYLENOL) suppository 650 mg  650 mg Rectal Q6H PRN Newt Minion, MD      . albuterol (PROVENTIL) (2.5 MG/3ML) 0.083% nebulizer solution 2.5 mg  2.5 mg Inhalation Q6H PRN Sharene Butters E, PA-C   2.5 mg at 03/16/17 2352  . calcium carbonate (dosed in mg elemental calcium) suspension 250 mg of elemental calcium  250 mg of elemental calcium Oral TID Roney Jaffe, MD   250 mg of elemental calcium at 03/18/17 0908  . carvedilol (COREG) tablet 12.5 mg  12.5 mg Oral BID WC Roney Jaffe, MD   12.5 mg at 03/18/17 0907  . chlorhexidine gluconate (MEDLINE KIT) (PERIDEX) 0.12 % solution 15 mL  15 mL Mouth Rinse BID Mannam, Praveen, MD   15 mL at 03/18/17 0943  . Darbepoetin Alfa (ARANESP) injection 200 mcg  200 mcg Intravenous Q Antonieta Pert, MD   200 mcg at 03/17/17 1508  . feeding supplement (NEPRO CARB STEADY) liquid 1,000 mL  1,000 mL Per Tube Q24H Simonne Maffucci B, MD 25 mL/hr at 03/17/17 1135 1,000 mL at  03/17/17 1135  . feeding supplement (PRO-STAT SUGAR FREE 64) liquid 60 mL  60 mL Per Tube QID Mannam, Praveen, MD   60 mL at 03/18/17 0909  . fentaNYL (SUBLIMAZE) injection 25-100 mcg  25-100 mcg Intravenous Q2H PRN Brand Males, MD   50 mcg at 03/09/17 2346  . [name]'s butt cream   Topical QID Rosita Fire, MD      . heparin injection 1,000 Units  1,000 Units Dialysis PRN Lynnda Child, PA-C      . heparin injection 1,900 Units  20 Units/kg Dialysis PRN Rexene Agent, MD      .  heparin injection 5,000 Units  5,000 Units Subcutaneous Q8H Rondel Jumbo, PA-C   5,000 Units at 03/18/17 0455  . HYDROmorphone (DILAUDID) injection 1 mg  1 mg Intravenous Q2H PRN Newt Minion, MD      . insulin aspart (novoLOG) injection 0-15 Units  0-15 Units Subcutaneous Q4H Parrett, Tammy S, NP   3 Units at 03/18/17 0906  . lidocaine (PF) (XYLOCAINE) 1 % injection 5 mL  5 mL Intradermal PRN Lynnda Child, PA-C      . lidocaine-prilocaine (EMLA) cream 1 application  1 application Topical PRN Lynnda Child, PA-C      . MEDLINE mouth rinse  15 mL Mouth Rinse QID Mannam, Praveen, MD   15 mL at 03/18/17 0454  . metoCLOPramide (REGLAN) tablet 5-10 mg  5-10 mg Oral Q8H PRN Newt Minion, MD       Or  . metoCLOPramide (REGLAN) injection 5-10 mg  5-10 mg Intravenous Q8H PRN Newt Minion, MD      . metoprolol tartrate (LOPRESSOR) injection 5 mg  5 mg Intravenous Q4H PRN Rosita Fire, MD   5 mg at 03/18/17 0019  . multivitamin (RENA-VIT) tablet 1 tablet  1 tablet Oral Nile Riggs, MD   1 tablet at 03/17/17 2230  . ondansetron (ZOFRAN) tablet 4 mg  4 mg Oral Q6H PRN Newt Minion, MD       Or  . ondansetron [facility]) injection 4 mg  4 mg Intravenous Q6H PRN Newt Minion, MD      . pantoprazole sodium (PROTONIX) 40 mg/20 mL oral suspension 40 mg  40 mg Per Tube Q24H Chesley Mires, MD   40 mg at 03/17/17 1956  . pentafluoroprop-tetrafluoroeth (GEBAUERS) aerosol 1  application  1 application Topical PRN Ejigiri, Thomos Lemons, PA-C      . potassium & sodium phosphates (PHOS-NAK) 280-160-250 MG packet 1 packet  1 packet Per Tube TID WC & HS Abrol, Nayana, MD      . sodium chloride flush (NS) 0.9 % injection 10-40 mL  10-40 mL Intracatheter Q12H Ghimire, Henreitta Leber, MD   10 mL at 03/18/17 0944  . sodium chloride flush (NS) 0.9 % injection 10-40 mL  10-40 mL Intracatheter PRN Ghimire, Henreitta Leber, MD         Discharge Medications: Please see discharge summary for a list of discharge medications.  Relevant Imaging Results:  Relevant Lab Results:   Additional Information SSN: [id number]; patient will need stretcher dialysis at facility  Saori Umholtz, Connye Burkitt, LCSW

## 2017-03-18 NOTE — Progress Notes (Signed)
PROGRESS NOTE        PATIENT DETAILS Name: [name] Age: 36 y.o. Sex: female Date of Birth: 11-02-1980 Admit Date: 02/14/2017 Admitting Physician Lady Deutscher, MD [id number], Helene Kelp, FNP  Note-care assumed by me on 6/13  Brief Narrative: Patient is a 36 y.o. female with history of ESRD on HD, poorly controlled diabetes, history of bilateral transmetatarsal foot amputation with subsequent left foot wound dehiscence (refused BKA as outpatient), chronic systolic heart failure felt to be secondary to nonischemic cardiomyopathy admitted on 02/14/17 with wound dehiscence and persistent drainage from the left trans-metatarsal amputation site, she was admitted to the [facility]. She was subsequently found to be unresponsive in her room on 5/19 after getting IV Dilaudid-she was  in asystole, CPR was started, with ROSC in around 9 minutes.She was then transferred to the intensive care unit, and subsequently underwent cooling and then rewarming. She was unable to be liberated off the ventilator, and as a result underwent tracheostomy on 5/31. Patient has been evaluated by neurology during this hospital stay, per neurology chances of meaningful neurological recovery is dismal. Hospital course has now been complicated by persistent leukocytosis, respiratory failure, and wound dehiscence with gangrene causing persistent SIRS pathophysiology. 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 Continue attempts to wean/liberate off ventilator, PCCM following-tracheostomy in place. Trach replaced for same size #6. Critical care evaluated patient 6/18 recommend maintaining current Trach  Size.  Trach care per protocol. If improvement continues, and she is able to tolerate more weaning trial, she may be ready for Swallow study and PMV training, hold off on PEG for now. Repeat speech therapy evaluation today Will repeat chest x-ray 6/19. Explained to the mother in detail that respiratory status tenuous , does not allow downsizing her trach   Cardiac arrest on 5/19 with anoxic brain injury: She seems to be slowly improving, following all major commands.  Note-Evaluated by neurology on 5/29-felt to have poor overall prognoses for any meaningful recovery.    Systemic inflammatory response syndrome:/Persistent leukocytosis Continues to be intermittently febrile (low-grade), tachycardic-and has persistent leukocytosis white count slightly improved from 23.3-> 19.4 which is slowly improving. Likely secondary to left foot gangrene/infection and abscess-she is now status post AKA and still continues to have these symptoms. Blood cultures on 6/1, 6/13 continue to be negative. . Chest x-ray on 6/13 negative for pneumonia. C. difficile studies on 6/6 negative, will repeat given prolonged course of antibiotics . Patient does have diarrhea and has a rectal tube in place. Marland Kitchen She was on vancomycin and Tressie Ellis that was started on 6/14, and subsequently discontinued on 6/16 after AKA. Spoke with Dr Comer-ID on call, who has evaluated this patient in the past-he recommends that we resume her vancomycin and Fortaz-and continue for 5 days from 6/15 when she had a AKA  through 6/20. Fever has improved off any sedation of antibiotics, consult infectious disease if no improvement.   Left trans-metatarsal stump wound dehiscence with infection and gangrene: Per operative note-pus/abscess noted extending up to the knee when BKA was attempted, subsequently underwent a AKA. Note, initially family was very reluctant to proceed  with amputation, but given persistent leukocytosis/fever and potential risk of sepsis, family agreed for  amputation, subsequently AKA was done on 6/15.  She  has a wound VAC in place which will be continued for 1 week postoperatively until 6/22. Pain seems to be under control  ESRD: Nephrology following-HD on TTS.   Anemia: Hemoglobin 7.5-9.0  Likely secondary to chronic disease-probably worsened by acute illness. No signs of bleeding, hemoglobin has been stable around 7.6   DM-2: CBGs stable-continue with SSI.   Chronic systolic heart failure/nonischemic cardiomyopathy (EF 30-35% by TEE on 6/4): Volume status remained stable-this is managed with dialysis-continue with Coreg.   Pericardial effusion: Seen on TEE-with no evidence of tamponade pathophysiology. Repeat echo at some point in the next few weeks.  Moderate protein calorie malnutrition: Continue NG tube feedings  Hypokalemia-recheck labs tomorrow  Goals of care: Unfortunate 36 year old female with ESRD, cardiomyopathy, bilateral transmetatarsal amputations complicated by left stump dehiscence-suffered a cardiac arrest on admission-now with anoxic brain injury-ventilator dependent-tracheostomy in place, awaiting  initiation of oral feeding. Hospital course has been prolonged-complicated by failure to wean from the ventilator, persistent systemic inflammatory response syndrome due to left foot gangrene/infection-after extensive discussion with family-she remains a full code, with family choosing to continue with full aggressive care. 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 Her prognosis is still very guarded at this time, will await attempts to see if she can be liberated off the ventilator. Family is aware of the risk of decompensation and deterioration.     Telemetry (independently reviewed): Sinus tachycardia  Studies: Lower extremity ultrasound 5/18 >> no evidence of DVT CT head 5/19 >> normal exam Echo 5/20 >> EF 30-35%, increased LVF. Diffuse hypokinesis, akinesis of basilar mid-inferior myocardium, grade 2  diastolic dysfxn  EEG 7/04 >> finding c/w mod to severe global cerebral dysfxn. C/w anoxic injury given clinical course  LE [place] 5/21 >> negative MRI brain 5/24 >> ?thrombosed cortical vein, otherwise normal TEE 6/4 >> mild MR, normal AV, mild TR, mild PR, EF 30-35%, diffuse hypokinesis, no thrombus, no PFO, normal RV, moderate pericardial effusion with synechia suggesting some chronicity, no vegetations   Echo (reviewed):EF 30-35% on TEE done on 6/4  Morning labs/Imaging ordered: yes  DVT Prophylaxis: Prophylactic Heparin   Code Status: Full code   Family Communication: Mother at bedside, discussed on a daily basis  Disposition Plan: Remain inpatient in SDU  Antimicrobial agents: Anti-infectives    Start     Dose/Rate Route Frequency Ordered Stop   03/17/17 1800  cefTAZidime (FORTAZ) 2 g in dextrose 5 % 50 mL IVPB     2 g 100 mL/hr over 30 Minutes Intravenous Every T-Th-Sa (1800) 03/15/17 1143     03/17/17 1200  vancomycin (VANCOCIN) IVPB 1000 mg/200 mL premix     1,000 mg 200 mL/hr over 60 Minutes Intravenous Every T-Th-Sa (Hemodialysis) 03/15/17 1143     03/14/17 1200  vancomycin (VANCOCIN) IVPB 1000 mg/200 mL premix  Status:  Discontinued     1,000 mg 200 mL/hr over 60 Minutes Intravenous Every T-Th-Sa (Hemodialysis) 03/12/17 0912 03/14/17 1542   03/13/17 1430  ceFAZolin (ANCEF) IVPB 2g/100 mL premix  Status:  Discontinued     2 g 200 mL/hr over 30 Minutes Intravenous To ShortStay Surgical 03/12/17 1208 03/13/17 1113   03/13/17 1115  ceFAZolin (ANCEF) IVPB 2g/100 mL premix     2 g 200 mL/hr over 30 Minutes Intravenous To Surgery 03/13/17 1108 03/13/17 1506   03/12/17 1800  cefTAZidime (FORTAZ) 2 g in dextrose 5 % 50 mL IVPB  Status:  Discontinued     2 g 100 mL/hr over 30 Minutes Intravenous Every T-Th-Sa (1800) 03/12/17 0912 03/14/17 1542   03/12/17 1200  vancomycin (VANCOCIN) 1,500 mg in sodium chloride 0.9 % 250 mL IVPB     1,500 mg 250 mL/hr over 60 Minutes  Intravenous Every Thu (Hemodialysis) 03/12/17 0912 03/12/17 1356   03/02/17 1200  cefTRIAXone (ROCEPHIN) 2 g in dextrose 5 % 50 mL IVPB     2 g 100 mL/hr over 30 Minutes Intravenous Every 24 hours 03/02/17 0810 03/06/17 1358   03/02/17 1200  vancomycin (VANCOCIN) IVPB 1000 mg/200 mL premix     1,000 mg 200 mL/hr over 60 Minutes Intravenous Every M-W-F (Hemodialysis) 03/02/17 0815 03/02/17 1457   03/02/17 1200  metroNIDAZOLE (FLAGYL) IVPB 500 mg     500 mg 100 mL/hr over 60 Minutes Intravenous Every 8 hours 03/02/17 0948 03/06/17 2230   02/26/17 1200  vancomycin (VANCOCIN) IVPB 1000 mg/200 mL premix     1,000 mg 200 mL/hr over 60 Minutes Intravenous Every T-Th-Sa (Hemodialysis) 02/25/17 1151 03/05/17 1325   02/25/17 1200  vancomycin (VANCOCIN) 2,000 mg in sodium chloride 0.9 % 500 mL IVPB     2,000 mg 250 mL/hr over 120 Minutes Intravenous  Once 02/25/17 1149 02/25/17 1449   02/25/17 0900  cefTRIAXone (ROCEPHIN) 2 g in dextrose 5 % 50 mL IVPB  Status:  Discontinued     2 g 100 mL/hr over 30 Minutes Intravenous Every 24 hours 02/25/17 0814 03/02/17 0810   02/25/17 0900  metroNIDAZOLE (FLAGYL) IVPB 500 mg  Status:  Discontinued     500 mg 100 mL/hr over 60 Minutes Intravenous Every 8 hours 02/25/17 0814 03/02/17 0948   02/20/17 1200  vancomycin (VANCOCIN) IVPB 1000 mg/200 mL premix  Status:  Discontinued     1,000 mg 200 mL/hr over 60 Minutes Intravenous Every M-W-F (Hemodialysis) 02/19/17 1506 02/20/17 1403   02/17/17 1800  meropenem (MERREM) 500 mg in sodium chloride 0.9 % 50 mL IVPB  Status:  Discontinued     500 mg 100 mL/hr over 30 Minutes Intravenous Daily-1800 02/17/17 1026 02/25/17 0814   02/17/17 1200  vancomycin (VANCOCIN) IVPB 1000 mg/200 mL premix     1,000 mg 200 mL/hr over 60 Minutes Intravenous Every T-Th-Sa (Hemodialysis) 02/14/17 1758 02/19/17 1612   02/14/17 2200  clindamycin (CLEOCIN) IVPB 900 mg  Status:  Discontinued     900 mg 100 mL/hr over 30 Minutes Intravenous  Every 8 hours 02/14/17 1106 02/14/17 1928   02/14/17 2000  clindamycin (CLEOCIN) IVPB 900 mg  Status:  Discontinued     900 mg 100 mL/hr over 30 Minutes Intravenous Every 8 hours 02/14/17 1928 02/16/17 1047   02/14/17 1915  piperacillin-tazobactam (ZOSYN) IVPB 3.375 g  Status:  Discontinued     3.375 g 100 mL/hr over 30 Minutes Intravenous Every 12 hours 02/14/17 1801 02/17/17 1026   02/14/17 1845  vancomycin (VANCOCIN) 2,000 mg in sodium chloride 0.9 % 500 mL IVPB     2,000 mg 250 mL/hr over 120 Minutes Intravenous  Once 02/14/17 1758 02/14/17 2107   02/14/17 1115  clindamycin (CLEOCIN) IVPB 900 mg  Status:  Discontinued     900 mg 100 mL/hr over 30 Minutes Intravenous  Once 02/14/17 1106 02/14/17 2236   02/14/17 0945  clindamycin (CLEOCIN) IVPB 600 mg     600 mg 100 mL/hr over 30 Minutes Intravenous  Once 02/14/17  4037 02/14/17 1013      Procedures: ETT 5/19 >> 5/31 Lt IJ CVL 5/19 >> out Trach 5/31 >>  TEE 6/4 >> mild MR, normal AV, mild TR, mild PR, EF 30-35%, diffuse hypokinesis, no thrombus, no PFO, normal RV, moderate pericardial effusion with synechia suggesting some chronicity, no vegetations  Rt IJ TLC 6/13>> Left AKA 6/15>>  CONSULTS:  Infectious disease:  Neurology  Gastroenterology  Wound care  PCCM  Nephrology  Orthopedics  Time spent: 30 minutes-Greater than 50% of this time was spent in counseling, explanation of diagnosis, planning of further management, and coordination of care.  MEDICATIONS: Scheduled Meds: . calcium carbonate (dosed in mg elemental calcium)  250 mg of elemental calcium Oral TID  . carvedilol  12.5 mg Oral BID WC  . chlorhexidine gluconate (MEDLINE KIT)  15 mL Mouth Rinse BID  . darbepoetin (ARANESP) injection - DIALYSIS  200 mcg Intravenous Q Tue-HD  . feeding supplement (NEPRO CARB STEADY)  1,000 mL Per Tube Q24H  . feeding supplement (PRO-STAT SUGAR FREE 64)  60 mL Per Tube QID  . [name]'s butt cream   Topical QID  .  heparin  5,000 Units Subcutaneous Q8H  . insulin aspart  0-15 Units Subcutaneous Q4H  . mouth rinse  15 mL Mouth Rinse QID  . multivitamin  1 tablet Oral QHS  . pantoprazole sodium  40 mg Per Tube Q24H  . potassium & sodium phosphates  1 packet Per Tube TID WC & HS  . sodium chloride flush  10-40 mL Intracatheter Q12H   Continuous Infusions: . sodium chloride 10 mL/hr at 03/16/17 1634  . sodium chloride    . sodium chloride    . sodium chloride    . sodium chloride    . sodium chloride    . sodium chloride    . cefTAZidime (FORTAZ)  IV Stopped (03/17/17 2018)  . vancomycin 1,000 mg (03/17/17 1757)   PRN Meds:.sodium chloride, sodium chloride, sodium chloride, sodium chloride, sodium chloride, sodium chloride, acetaminophen (TYLENOL) oral liquid 160 mg/5 mL, acetaminophen **OR** acetaminophen, albuterol, fentaNYL (SUBLIMAZE) injection, heparin, heparin, HYDROmorphone (DILAUDID) injection, lidocaine (PF), lidocaine-prilocaine, metoCLOPramide **OR** metoCLOPramide (REGLAN) injection, metoprolol tartrate, ondansetron **OR** ondansetron (ZOFRAN) IV, pentafluoroprop-tetrafluoroeth, sodium chloride flush   PHYSICAL EXAM: Vital signs: Vitals:   03/18/17 0500 03/18/17 0600 03/18/17 0734 03/18/17 0800  BP: 94/71 95/66 110/82 112/72  Pulse: (!) 115 (!) 116  (!) 112  Resp: (!) 8  17 13   Temp:    99 F (37.2 C)  TempSrc:    Oral  SpO2: 100% 100% 100% 100%  Weight:      Height:       Filed Weights   03/17/17 1425 03/17/17 1848 03/18/17 0455  Weight: 87 kg (191 lb 12.8 oz) 85.5 kg (188 lb 7.9 oz) 83 kg (182 lb 15.7 oz)   Body mass index is 27.42 kg/m.  General appearance:Sleeping when I walked in-easily arouses, squeeze my hands. Eyes: No scleral icterus HEENT: Atraumatic and Normocephalic Neck: supple, tracheostomy in place Resp:Good air entry bilaterally, some transmitted upper airway sounds CVS: S1 S2 regular, tachycardic GI: Bowel sounds present, Non tender and not distended with  no gaurding, rigidity or rebound. Extremities: Left AKA stump-vac in place. Musculoskeletal:No digital cyanosis  I have personally reviewed following labs and imaging studies  LABORATORY DATA: CBC:  Recent Labs Lab 03/12/17 0702 03/13/17 0557 03/14/17 0449 03/15/17 0455 03/16/17 0444 03/17/17 0405  WBC 24.0* 23.4* 22.8* 24.9* 23.3* 19.4*  NEUTROABS 20.2*  --  19.6*  --   --   --   HGB 8.9* 8.1* 7.6* 7.5* 7.6* 7.6*  HCT 29.5* 27.2* 25.7* 25.5* 25.4* 26.1*  MCV 96.7 97.5 96.6 98.1 95.5 95.3  PLT 380 363 334 359 371 402*    Basic Metabolic Panel:  Recent Labs Lab 03/13/17 0557 03/14/17 0449 03/15/17 0455 03/17/17 0405  NA 136 136 135 136  K 4.1 4.4 3.5 3.1*  CL 96* 97* 95* 97*  CO2 26 23 27 24   GLUCOSE 172* 146* 157* 175*  BUN 45* 63* 29* 95*  CREATININE 3.78* 5.26* 3.17* 5.81*  CALCIUM 9.0 8.6* 8.3* 8.7*  PHOS 3.0 5.1* 3.9  --     GFR: Estimated Creatinine Clearance: 15.1 mL/min (A) (by C-G formula based on SCr of 5.81 mg/dL (H)).  Liver Function Tests:  Recent Labs Lab 03/13/17 0557 03/14/17 0449 03/15/17 0455 03/17/17 0405  AST  --   --   --  21  ALT  --   --   --  6*  ALKPHOS  --   --   --  227*  BILITOT  --   --   --  1.5*  PROT  --   --   --  6.3*  ALBUMIN 2.4* 2.4* 2.2* 2.1*   No results for input(s): LIPASE, AMYLASE in the last 168 hours. No results for input(s): AMMONIA in the last 168 hours.  Coagulation Profile: No results for input(s): INR, PROTIME in the last 168 hours.  Cardiac Enzymes: No results for input(s): CKTOTAL, CKMB, CKMBINDEX, TROPONINI in the last 168 hours.  BNP (last 3 results) No results for input(s): PROBNP in the last 8760 hours.  HbA1C: No results for input(s): HGBA1C in the last 72 hours.  CBG:  Recent Labs Lab 03/17/17 1546 03/17/17 2011 03/18/17 0017 03/18/17 0452 03/18/17 0800  GLUCAP 111* 171* 179* 217* 155*    Lipid Profile: No results for input(s): CHOL, HDL, LDLCALC, TRIG, CHOLHDL, LDLDIRECT  in the last 72 hours.  Thyroid Function Tests: No results for input(s): TSH, T4TOTAL, FREET4, T3FREE, THYROIDAB in the last 72 hours.  Anemia Panel: No results for input(s): VITAMINB12, FOLATE, FERRITIN, TIBC, IRON, RETICCTPCT in the last 72 hours.  Urine analysis:    Component Value Date/Time   COLORURINE AMBER (A) 02/14/2017 2111   APPEARANCEUR HAZY (A) 02/14/2017 2111   LABSPEC 1.015 02/14/2017 2111   PHURINE 5.0 02/14/2017 2111   GLUCOSEU NEGATIVE 02/14/2017 2111   HGBUR SMALL (A) 02/14/2017 2111   BILIRUBINUR NEGATIVE 02/14/2017 2111   KETONESUR NEGATIVE 02/14/2017 2111   PROTEINUR 30 (A) 02/14/2017 2111   UROBILINOGEN 0.2 09/21/2014 1908   NITRITE NEGATIVE 02/14/2017 2111   LEUKOCYTESUR TRACE (A) 02/14/2017 2111    Sepsis Labs: Lactic Acid, Venous    Component Value Date/Time   LATICACIDVEN 4.6 ([place]) 02/14/2017 1807    MICROBIOLOGY: Recent Results (from the past 240 hour(s))  Culture, blood (routine x 2)     Status: None   Collection Time: 03/11/17  8:42 AM  Result Value Ref Range Status   Specimen Description BLOOD RIGHT HAND  Final   Special Requests   Final    BOTTLES DRAWN AEROBIC ONLY Blood Culture adequate volume   Culture NO GROWTH 5 DAYS  Final   Report Status 03/16/2017 FINAL  Final  Culture, blood (routine x 2)     Status: None   Collection Time: 03/11/17  8:46 AM  Result Value Ref Range Status   Specimen Description BLOOD RIGHT HAND  Final   Special Requests   Final    BOTTLES DRAWN AEROBIC AND ANAEROBIC Blood Culture adequate volume   Culture NO GROWTH 5 DAYS  Final   Report Status 03/16/2017 FINAL  Final    RADIOLOGY STUDIES/RESULTS: Ct Abdomen Wo Contrast  Result Date: 03/02/2017 CLINICAL DATA:  Preop for gastrostomy to EXAM: CT ABDOMEN WITHOUT CONTRAST TECHNIQUE: Multidetector CT imaging of the abdomen was performed following the standard protocol without IV contrast. COMPARISON:  None. FINDINGS: Lower chest: Bibasilar dependent and medial  consolidation. Large pericardial effusion. Hepatobiliary: The left lobe of the liver is prominent and anterior to the stoma. The gallbladder is not clearly visualized. It may either be decompressed or absent due to cholecystectomy. Pancreas: Unremarkable Spleen: Unremarkable Adrenals/Urinary Tract: Kidneys and adrenal glands are within normal limits. Stomach/Bowel: The stomach is positioned deep to the liver and colon. Gastrostomy tube placement may be problematic. No evidence of small-bowel obstruction. Feeding tube tip is in the proximal duodenum. Vascular/Lymphatic: Small para-aortic lymph nodes. Atherosclerotic vascular calcifications are noted. No evidence of aortic aneurysm. Other: No free-fluid. Musculoskeletal: No vertebral compression deformity. IMPRESSION: The stomach is positioned deep to a prominent left lobe of the liver as well as the transverse colon. Gas is distension of the stomach and barium opacification of the transverse colon will be essential during the procedure. Bibasilar pulmonary consolidation. Large pericardial effusion. Electronically Signed   By: Marybelle Killings M.D.   On: 03/02/2017 07:18   Mr Brain Wo Contrast  Result Date: 02/18/2017 CLINICAL DATA:  Anoxic brain injury status post cardiac arrest EXAM: MRI HEAD WITHOUT CONTRAST TECHNIQUE: Multiplanar, multiecho pulse sequences of the brain and surrounding structures were obtained without intravenous contrast. IV contrast could not be administered due to the patient's poor renal function. COMPARISON:  Head CT 02/14/2017 FINDINGS: Brain: The midline structures are normal. There is no focal diffusion restriction to indicate acute infarct. The brain parenchymal signal is normal and there is no mass lesion. There is a focal susceptibility abnormality at the left insula (series 12, image 55). Brain volume is normal for age without age-advanced or lobar predominant atrophy. The dura is normal and there is no extra-axial collection. Vascular:  Major intracranial arterial and venous sinus flow voids are preserved. Skull and upper cervical spine: The visualized skull base, calvarium, upper cervical spine and extracranial soft tissues are normal. Sinuses/Orbits: No fluid levels or advanced mucosal thickening. No mastoid effusion. Normal orbits. IMPRESSION: 1. Focus of susceptibility in the left insula may indicate a thrombosed cortical vein. The location does not correspond to a large artery. 2. No associated ischemia or hemorrhage.  Otherwise normal brain. Electronically Signed   By: Ulyses Jarred M.D.   On: 02/18/2017 14:55   Ir Fluoro Guide Cv Line Right  Result Date: 03/11/2017 INDICATION: Osteomyelitis EXAM: TUNNELED RIGHT JUGULAR PICC LINE PLACEMENT WITH ULTRASOUND AND FLUOROSCOPIC GUIDANCE MEDICATIONS: None. ANESTHESIA/SEDATION: None FLUOROSCOPY TIME:  Fluoroscopy Time:  minutes 30 seconds (1 mGy). COMPLICATIONS: None immediate. PROCEDURE: The patient was advised of the possible risks and complications and agreed to undergo the procedure. The patient was then brought to the angiographic suite for the procedure. The right neck was prepped with chlorhexidine, draped in the usual sterile fashion using maximum barrier technique (cap and mask, sterile gown, sterile gloves, large sterile sheet, hand hygiene and cutaneous antiseptic). Local anesthesia was attained by infiltration with 1% lidocaine. Ultrasound demonstrated patency of the right jugular vein, and this was documented with an image. Under real-time ultrasound guidance, this vein  was accessed with a 21 gauge micropuncture needle and image documentation was performed. A long subcutaneous tract was employed. The needle was exchanged over a guidewire for a peel-away sheath through which a 26 cm 5 [place] double lumen power injectable PICC was advanced, and positioned with its tip at the lower SVC/right atrial junction. The cuff was positioned in the subcutaneous tract. Fluoroscopy during the  procedure and fluoro spot radiograph confirms appropriate catheter position. The catheter was flushed, secured to the skin with Prolene sutures, and covered with a sterile dressing. IMPRESSION: Successful placement of a tunneled right jugular PICC with sonographic and fluoroscopic guidance. The catheter is ready for use. Electronically Signed   By: Marybelle Killings M.D.   On: 03/11/2017 15:31   Ir US Guide Vasc Access Right  Result Date: 03/11/2017 INDICATION: Osteomyelitis EXAM: TUNNELED RIGHT JUGULAR PICC LINE PLACEMENT WITH ULTRASOUND AND FLUOROSCOPIC GUIDANCE MEDICATIONS: None. ANESTHESIA/SEDATION: None FLUOROSCOPY TIME:  Fluoroscopy Time:  minutes 30 seconds (1 mGy). COMPLICATIONS: None immediate. PROCEDURE: The patient was advised of the possible risks and complications and agreed to undergo the procedure. The patient was then brought to the angiographic suite for the procedure. The right neck was prepped with chlorhexidine, draped in the usual sterile fashion using maximum barrier technique (cap and mask, sterile gown, sterile gloves, large sterile sheet, hand hygiene and cutaneous antiseptic). Local anesthesia was attained by infiltration with 1% lidocaine. Ultrasound demonstrated patency of the right jugular vein, and this was documented with an image. Under real-time ultrasound guidance, this vein was accessed with a 21 gauge micropuncture needle and image documentation was performed. A long subcutaneous tract was employed. The needle was exchanged over a guidewire for a peel-away sheath through which a 26 cm 5 [place] double lumen power injectable PICC was advanced, and positioned with its tip at the lower SVC/right atrial junction. The cuff was positioned in the subcutaneous tract. Fluoroscopy during the procedure and fluoro spot radiograph confirms appropriate catheter position. The catheter was flushed, secured to the skin with Prolene sutures, and covered with a sterile dressing. IMPRESSION: Successful  placement of a tunneled right jugular PICC with sonographic and fluoroscopic guidance. The catheter is ready for use. Electronically Signed   By: Marybelle Killings M.D.   On: 03/11/2017 15:31   Dg Chest Port 1 View  Result Date: 03/17/2017 CLINICAL DATA:  Shortness of Breath EXAM: PORTABLE CHEST 1 VIEW COMPARISON:  03/11/2017 FINDINGS: Cardiac shadow is enlarged but stable. Tracheostomy tube, feeding catheter and right-sided jugular central line are noted. The lungs are poorly aerated but no focal infiltrate is seen. Mild crowding of the vascular markings is noted related to the poor inspiratory effort. No bony abnormality is seen. IMPRESSION: Stable cardiomegaly. Poor inspiratory effort although no focal infiltrate is seen. Electronically Signed   By: Inez Catalina M.D.   On: 03/17/2017 13:48   Dg Chest Port 1 View  Result Date: 03/11/2017 CLINICAL DATA:  Leukocytosis. EXAM: PORTABLE CHEST 1 VIEW COMPARISON:  03/03/2017 FINDINGS: Patient is rotated to the left. The cardio pericardial silhouette is enlarged. Slight improvement in left base aeration. Tracheostomy tube remains in place. A feeding tube passes into the stomach although the distal tip position is not included on the film. The visualized bony structures of the thorax are intact. Telemetry leads overlie the chest. IMPRESSION: Persistent enlargement of the cardiopericardial silhouette without evidence for overt pulmonary edema or substantial pleural effusion. Electronically Signed   By: Misty Stanley M.D.   On: 03/11/2017 08:37  Dg Chest Port 1 View  Result Date: 03/03/2017 CLINICAL DATA:  Respiratory failure. EXAM: PORTABLE CHEST 1 VIEW COMPARISON:  03/01/2017 . FINDINGS: Tracheostomy tube and feeding tube in stable position. Cardiomegaly with mild diffuse interstitial prominence suggesting mild CHF. Persistent atelectasis and consolidation left lower lobe. Small left pleural effusion cannot be excluded. No pneumothorax. IMPRESSION: 1. Tracheostomy  tube and feeding tube in stable position. 2. Cardiomegaly with diffuse mild interstitial prominence suggesting mild CHF. 3. Persistent left lower lobe atelectasis and consolidation. Small left pleural effusion cannot be excluded . Electronically Signed   By: Marcello Moores  Register   On: 03/03/2017 07:09   Dg Chest Port 1 View  Result Date: 03/01/2017 CLINICAL DATA:  Respiratory failure EXAM: PORTABLE CHEST 1 VIEW COMPARISON:  02/27/2017 FINDINGS: 0546 hours. Patient rotated to the left. Tracheostomy tube remains in place. A feeding tube passes into the stomach although the distal tip position is not included on the film. The cardio pericardial silhouette is enlarged. Left base collapse/consolidation again noted. There is pulmonary vascular congestion without overt pulmonary edema. IMPRESSION: Rotated film with cardiomegaly and vascular congestion. Persistent left base collapse/ consolidation. Electronically Signed   By: Misty Stanley M.D.   On: 03/01/2017 07:43   Dg Chest Port 1 View  Result Date: 02/27/2017 CLINICAL DATA:  Hypoxia. EXAM: PORTABLE CHEST 1 VIEW COMPARISON:  02/26/2017. FINDINGS: Tracheostomy to in stable position. Stable cardiomegaly. Low lung volumes. Progressive left lower lobe atelectasis and infiltrate. Small left pleural effusion cannot be excluded . IMPRESSION: 1. Tracheostomy tube in stable position. 2. Progressive left lower lobe atelectasis and infiltrate. Small left pleural effusion cannot be excluded . Electronically Signed   By: Marcello Moores  Register   On: 02/27/2017 07:01   Dg Chest Port 1 View  Result Date: 02/26/2017 CLINICAL DATA:  Status post tracheostomy placement. EXAM: PORTABLE CHEST 1 VIEW COMPARISON:  Earlier today. FINDINGS: The endotracheal tube has been removed and replaced with a tracheostomy tube in satisfactory position. The nasogastric tube and esophageal probe have been removed. The left jugular catheter has been removed. No pneumothorax. Grossly stable enlarged cardiac  silhouette. Left lower lobe opacity with improvement laterally since 02/25/2017. Clear right lung. Unremarkable bones. IMPRESSION: 1. Tracheostomy tube in satisfactory position. 2. Left lower lobe atelectasis or pneumonia with mild improvement since 2 days ago. 3. Stable cardiomegaly. Electronically Signed   By: Claudie Revering M.D.   On: 02/26/2017 16:02   Dg Chest Port 1 View  Result Date: 02/26/2017 CLINICAL DATA:  Hypoxia.  Shortness of breath. EXAM: PORTABLE CHEST 1 VIEW COMPARISON:  02/25/2017. FINDINGS: Endotracheal tube, NG tube, left IJ line esophageal probe in stable position. Cardiomegaly with bilateral pulmonary interstitial infiltrates consistent with CHF. Left base atelectasis . Small left pleural effusion. Similar findings noted on prior exam. No pneumothorax. IMPRESSION: 1. Lines and tubes in stable position. 2. Cardiomegaly with bilateral pulmonary interstitial prominence and small left pleural effusion noted consistent CHF. Left base atelectasis. Similar findings noted on prior exam. Electronically Signed   By: Archer   On: 02/26/2017 07:18   Dg Chest Port 1 View  Result Date: 02/25/2017 CLINICAL DATA:  Respiratory failure, intubated patient. Diabetes, cardiomyopathy, morbid obesity, end-stage renal disease. EXAM: PORTABLE CHEST 1 VIEW COMPARISON:  Portable chest x-ray of Feb 24, 2017 FINDINGS: The lungs are adequately inflated. The pulmonary interstitial markings are slightly less prominent today. The pulmonary vascularity remains engorged. The cardiac silhouette remains enlarged. Retrocardiac region on the left is slightly less dense. The endotracheal tube tip  lies 5.7 cm above the carina. The esophagogastric tube tip projects below the inferior margin of the image. The left internal jugular venous catheter tip projects over the proximal SVC. IMPRESSION: Slight interval improvement in pulmonary interstitial edema. Stable cardiomegaly. Persistent left lower lobe atelectasis or  pneumonia. The support tubes are in reasonable position. Electronically Signed   By: David  Martinique M.D.   On: 02/25/2017 07:57   Dg Chest Port 1 View  Result Date: 02/24/2017 CLINICAL DATA:  Respiratory failure EXAM: PORTABLE CHEST 1 VIEW COMPARISON:  Two days ago FINDINGS: Endotracheal tube tip at the clavicular heads. An orogastric tube reaches the stomach. Esophageal thermistor. Left IJ central line with tip at the SVC level. Cardiomegaly and diffuse hazy opacity with cephalized blood flow. No definitive effusion. No pneumothorax. IMPRESSION: 1. Stable positioning of tubes and central line. 2. CHF pattern. Electronically Signed   By: Monte Fantasia M.D.   On: 02/24/2017 07:31   Dg Chest Port 1 View  Result Date: 02/22/2017 CLINICAL DATA:  Respiratory failure EXAM: PORTABLE CHEST 1 VIEW COMPARISON:  02/21/2017 FINDINGS: Endotracheal tube terminates 5 cm above the carina. Cardiomegaly with mild interstitial edema. Retrocardiac opacity, possibly reflecting a combination of atelectasis and pleural effusion, pneumonia not excluded. No pneumothorax. Left IJ venous catheter terminates at the cavoatrial junction. Enteric tube courses into the stomach. IMPRESSION: Endotracheal tube terminates 5 cm above the carina. Cardiomegaly with mild interstitial edema. Retrocardiac opacity, possibly atelectasis and pleural effusion, pneumonia not excluded. Electronically Signed   By: Julian Hy M.D.   On: 02/22/2017 07:30   Dg Chest Port 1 View  Result Date: 02/21/2017 CLINICAL DATA:  Endotracheal tube. EXAM: PORTABLE CHEST 1 VIEW COMPARISON:  02/19/2017 FINDINGS: Endotracheal tube terminates approximately 5 cm above the carina. Enteric tube courses into the left upper abdomen with tip not imaged. Left jugular catheter terminates over the mid SVC. The cardiac silhouette remains enlarged. Pulmonary vascular congestion and bilateral parenchymal lung opacities have not significantly changed. No large pleural effusion  or pneumothorax is identified. IMPRESSION: Unchanged pulmonary edema. Electronically Signed   By: Logan Bores M.D.   On: 02/21/2017 07:42   Dg Chest Port 1 View  Result Date: 02/19/2017 CLINICAL DATA:  36 year old female with respiratory failure. EXAM: PORTABLE CHEST 1 VIEW COMPARISON:  02/18/2017 and prior radiograph FINDINGS: An endotracheal tube with tip 3.5 cm above the carina, left IJ central venous catheter with tip overlying the lower SVC, and NG tube entering the stomach with tip off the field of view again noted. Pulmonary edema has decreased from the prior study. Continued left lower lung atelectasis/ consolidation again noted. There is no evidence of pneumothorax. No other changes are noted. IMPRESSION: Decreased pulmonary edema, otherwise unchanged appearance of the chest. Electronically Signed   By: Margarette Canada M.D.   On: 02/19/2017 08:03   Dg Chest Port 1 View  Result Date: 02/18/2017 CLINICAL DATA:  Respiratory failure EXAM: PORTABLE CHEST 1 VIEW COMPARISON:  02/17/2017 FINDINGS: Cardiac shadow remains enlarged. Endotracheal tube, nasogastric catheter and esophageal probe are again seen and stable. Left jugular central line is again noted and stable. No pneumothorax is seen. Diffuse vascular congestion and increased parenchymal opacity is noted consistent with progressive CHF. No focal confluent infiltrate is noted. IMPRESSION: Progressive changes of CHF. Electronically Signed   By: Inez Catalina M.D.   On: 02/18/2017 08:15   Dg Chest Port 1 View  Result Date: 02/17/2017 CLINICAL DATA:  Pulmonary edema EXAM: PORTABLE CHEST 1 VIEW COMPARISON:  02/16/2017  FINDINGS: Endotracheal tube, NG tube, left jugular central venous catheter are stable. Severe cardiomegaly is stable. Mild vascular congestion is stable. No consolidation. No interstitial edema. IMPRESSION: Stable cardiomegaly and mild vascular congestion. Electronically Signed   By: Marybelle Killings M.D.   On: 02/17/2017 07:35   Dg Abd  Portable 1v  Result Date: 03/02/2017 CLINICAL DATA:  Encounter for feeding tube placement EXAM: PORTABLE ABDOMEN - 1 VIEW COMPARISON:  Portable exam 1712 hours compared to CT abdomen and pelvis of 03/01/2017 FINDINGS: Feeding tube traverses abdomen with tip projecting over distal antrum near pylorus. Cardiac silhouette appears enlarged. Visualized bowel gas pattern normal. Osseous structures unremarkable. IMPRESSION: Tip of feeding tube projects over distal gastric antrum near pylorus. Electronically Signed   By: Lavonia Dana M.D.   On: 03/02/2017 17:21   Dg Abd Portable 1v  Result Date: 02/27/2017 CLINICAL DATA:  Feeding tube placement EXAM: PORTABLE ABDOMEN - 1 VIEW COMPARISON:  None. FINDINGS: Feeding tube with the tip projecting over the antrum of the stomach. There is no bowel dilatation to suggest obstruction. There is no evidence of pneumoperitoneum, portal venous gas or pneumatosis. There are no pathologic calcifications along the expected course of the ureters. The osseous structures are unremarkable. IMPRESSION: Feeding tube with the tip projecting over the antrum of the stomach. Electronically Signed   By: Kathreen Devoid   On: 02/27/2017 11:05   US Abdomen Limited Ruq  Result Date: 02/18/2017 CLINICAL DATA:  Elevated liver function studies. EXAM: US ABDOMEN LIMITED - RIGHT UPPER QUADRANT COMPARISON:  None. FINDINGS: Gallbladder: No gallstones or wall thickening visualized. No sonographic Murphy sign noted by sonographer. Common bile duct: Diameter: 7 mm, normal Liver: Circumscribed hyperechoic lesion with central hypoechoic appearance demonstrated along the dome of the liver measuring 2.8 cm maximal diameter. This is likely to represent a cavernous hemangioma, but the appearance on ultrasound is somewhat atypical. Consider follow-up with elective MRI of the liver for better characterization. IMPRESSION: No evidence of cholelithiasis or cholecystitis. Circumscribed hyperechoic lesion in the dome of  the liver measuring 2.8 cm maximal diameter. This is probably a cavernous hemangioma but appearance is somewhat atypical. Suggest elective MRI liver in follow-up for better characterization. Electronically Signed   By: Lucienne Capers M.D.   On: 02/18/2017 02:10     LOS: 32 days   Reyne Dumas, MD  [facility] Pager: [phone]  If 7PM-7AM, please contact night-coverage [email or web address] Password [facility] 03/18/2017, 10:44 AM

## 2017-03-18 NOTE — Progress Notes (Addendum)
Nutrition Follow-up  DOCUMENTATION CODES:   Obesity unspecified  INTERVENTION:    Continue Nepro to goal rate of 15 ml/h with 60 ml Prostat QID   TF regimen providing 1464 kcals, 128 gm protein, 349 ml free water daily  NUTRITION DIAGNOSIS:   Inadequate oral intake related to inability to eat as evidenced by NPO status, ongoing  GOAL:   Patient will meet greater than or equal to 90% of their needs, met  MONITOR:   Vent status, TF tolerance, Labs, Weight trends, Skin, I & O's  ASSESSMENT:   36 yo Female w/ ESRD d/t poorly controlled DM, bilateral transmetatarsal foot amputations, obesity, HTN, medical non-compliance as well as NICM. Admitted with complaints of left leg pain, found to have radiological evidence of osteomyelitis with wound nonhealing, dehiscence, purulent drainage from the wound. Admitted for IV antibiotics, hemodialysis and possible surgery. Pt with nonhealing L foot wound, amputation recommended.   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  Prostat 60 ml QID. G-tube placement now on hold. Speech Path to attempt swallow study.  Rec trach downsize. Medications reviewed.  Reglan started 6/15.  Rena-Vit & ABX continue.  Palliative Care Team following. Labs reviewed. K 3.1 (L). CBG's [phone].  Diet Order:  Diet NPO time specified   Skin:  Left AKA stump - wound VAC MASD groin and sacrum Stage II anus  Last BM:  6/19 rectal tube  Height:   Ht Readings from Last 1 Encounters:  02/14/17 5' 8.5" (1.74 m)   Weight: >>> fluctuating but stable  Wt Readings from Last 1 Encounters:  03/18/17 182 lb 15.7 oz (83 kg)   Ideal Body Weight:  63.6 kg  BMI:  30.5 kg/m2 >> adjusted for AKA  Estimated Nutritional Needs:    Kcal:  1100-1400  Protein:  >/= 127 grams  Fluid:  per MD  EDUCATION NEEDS:   No education needs identified at this time  Arthur Holms, RD, LDN Pager #: [phone] After-Hours Pager #: [phone]

## 2017-03-18 NOTE — Progress Notes (Signed)
[facility] ROUNDING NOTE   Subjective:   Interval History:  36 y.o.femalewho presents with radiologic evidence of osteomyelitis with wound dehiscence and nonhealing. The patient is having drainage of mucopurulent drainage from the wound. Patient  admitted forBKA 6/15 , she is dialysis dependent and has a tracheostomy. Diarrhea and rectal tube . Right foot with protecting boot  Objective:  Vital signs in last 24 hours:  Temp:  [97.9 F (36.6 C)-99.7 F (37.6 C)] 99 F (37.2 C) (06/20 0800) Pulse Rate:  [108-124] 112 (06/20 0800) Resp:  [5-38] 13 (06/20 0800) BP: (94-127)/(66-97) 112/72 (06/20 0800) SpO2:  [90 %-100 %] 100 % (06/20 0800) FiO2 (%):  [30 %] 30 % (06/20 0734) Weight:  [182 lb 15.7 oz (83 kg)-191 lb 12.8 oz (87 kg)] 182 lb 15.7 oz (83 kg) (06/20 0455)  Weight change: -3.2 oz (-0.091 kg) Filed Weights   03/17/17 1425 03/17/17 1848 03/18/17 0455  Weight: 191 lb 12.8 oz (87 kg) 188 lb 7.9 oz (85.5 kg) 182 lb 15.7 oz (83 kg)    Intake/Output: I/O last 3 completed shifts: In: 2482 [I.V.:220; NG/GT:925; IV Piggyback:250] Out: 1500 [Other:1500]   Intake/Output this shift:  No intake/output data recorded.  CVS- RRR RS- CTA ABD- BS present soft non-distended EXT- no edema LUA AVF + bruit    Basic Metabolic Panel:  Recent Labs Lab 03/13/17 0557 03/14/17 0449 03/15/17 0455 03/17/17 0405  NA 136 136 135 136  K 4.1 4.4 3.5 3.1*  CL 96* 97* 95* 97*  CO2 26 23 27 24   GLUCOSE 172* 146* 157* 175*  BUN 45* 63* 29* 95*  CREATININE 3.78* 5.26* 3.17* 5.81*  CALCIUM 9.0 8.6* 8.3* 8.7*  PHOS 3.0 5.1* 3.9  --     Liver Function Tests:  Recent Labs Lab 03/13/17 0557 03/14/17 0449 03/15/17 0455 03/17/17 0405  AST  --   --   --  21  ALT  --   --   --  6*  ALKPHOS  --   --   --  227*  BILITOT  --   --   --  1.5*  PROT  --   --   --  6.3*  ALBUMIN 2.4* 2.4* 2.2* 2.1*   No results for input(s): LIPASE, AMYLASE in the last 168 hours. No  results for input(s): AMMONIA in the last 168 hours.  CBC:  Recent Labs Lab 03/12/17 0702 03/13/17 0557 03/14/17 0449 03/15/17 0455 03/16/17 0444 03/17/17 0405  WBC 24.0* 23.4* 22.8* 24.9* 23.3* 19.4*  NEUTROABS 20.2*  --  19.6*  --   --   --   HGB 8.9* 8.1* 7.6* 7.5* 7.6* 7.6*  HCT 29.5* 27.2* 25.7* 25.5* 25.4* 26.1*  MCV 96.7 97.5 96.6 98.1 95.5 95.3  PLT 380 363 334 359 371 402*    Cardiac Enzymes: No results for input(s): CKTOTAL, CKMB, CKMBINDEX, TROPONINI in the last 168 hours.  BNP: Invalid input(s): POCBNP  CBG:  Recent Labs Lab 03/17/17 1546 03/17/17 2011 03/18/17 0017 03/18/17 0452 03/18/17 0800  GLUCAP 111* 171* 179* 62* 155*    Microbiology: Results for orders placed or performed during the hospital encounter of 02/14/17  Culture, blood (Routine X 2) w Reflex to ID Panel     Status: None   Collection Time: 02/14/17  8:08 AM  Result Value Ref Range Status   Specimen Description BLOOD RIGHT ANTECUBITAL  Final   Special Requests IN PEDIATRIC BOTTLE Blood Culture adequate volume  Final   Culture NO  GROWTH 5 DAYS  Final   Report Status 02/19/2017 FINAL  Final  Culture, blood (Routine X 2) w Reflex to ID Panel     Status: None   Collection Time: 02/14/17  8:12 AM  Result Value Ref Range Status   Specimen Description BLOOD RIGHT HAND  Final   Special Requests IN PEDIATRIC BOTTLE Blood Culture adequate volume  Final   Culture NO GROWTH 5 DAYS  Final   Report Status 02/19/2017 FINAL  Final  MRSA PCR Screening     Status: None   Collection Time: 02/14/17  5:44 PM  Result Value Ref Range Status   MRSA by PCR NEGATIVE NEGATIVE Final    Comment:        The GeneXpert MRSA Assay (FDA approved for NASAL specimens only), is one component of a comprehensive MRSA colonization surveillance program. It is not intended to diagnose MRSA infection nor to guide or monitor treatment for MRSA infections.   C difficile quick scan w PCR reflex     Status: None    Collection Time: 02/27/17  9:41 AM  Result Value Ref Range Status   C Diff antigen NEGATIVE NEGATIVE Final   C Diff toxin NEGATIVE NEGATIVE Final   C Diff interpretation No C. difficile detected.  Final  Culture, blood (Routine X 2) w Reflex to ID Panel     Status: None   Collection Time: 02/27/17 10:39 AM  Result Value Ref Range Status   Specimen Description BLOOD RIGHT HAND  Final   Special Requests IN PEDIATRIC BOTTLE Blood Culture adequate volume  Final   Culture NO GROWTH 5 DAYS  Final   Report Status 03/04/2017 FINAL  Final  Culture, blood (Routine X 2) w Reflex to ID Panel     Status: None   Collection Time: 02/27/17 10:49 AM  Result Value Ref Range Status   Specimen Description BLOOD RIGHT HAND  Final   Special Requests IN PEDIATRIC BOTTLE Blood Culture adequate volume  Final   Culture NO GROWTH 5 DAYS  Final   Report Status 03/04/2017 FINAL  Final  C difficile quick scan w PCR reflex     Status: None   Collection Time: 03/04/17  2:17 PM  Result Value Ref Range Status   C Diff antigen NEGATIVE NEGATIVE Final   C Diff toxin NEGATIVE NEGATIVE Final   C Diff interpretation No C. difficile detected.  Final  Culture, blood (routine x 2)     Status: None   Collection Time: 03/11/17  8:42 AM  Result Value Ref Range Status   Specimen Description BLOOD RIGHT HAND  Final   Special Requests   Final    BOTTLES DRAWN AEROBIC ONLY Blood Culture adequate volume   Culture NO GROWTH 5 DAYS  Final   Report Status 03/16/2017 FINAL  Final  Culture, blood (routine x 2)     Status: None   Collection Time: 03/11/17  8:46 AM  Result Value Ref Range Status   Specimen Description BLOOD RIGHT HAND  Final   Special Requests   Final    BOTTLES DRAWN AEROBIC AND ANAEROBIC Blood Culture adequate volume   Culture NO GROWTH 5 DAYS  Final   Report Status 03/16/2017 FINAL  Final    Coagulation Studies: No results for input(s): LABPROT, INR in the last 72 hours.  Urinalysis: No results for  input(s): COLORURINE, LABSPEC, PHURINE, GLUCOSEU, HGBUR, BILIRUBINUR, KETONESUR, PROTEINUR, UROBILINOGEN, NITRITE, LEUKOCYTESUR in the last 72 hours.  Invalid input(s): APPERANCEUR  Imaging: Dg Chest Port 1 View  Result Date: 03/17/2017 CLINICAL DATA:  Shortness of Breath EXAM: PORTABLE CHEST 1 VIEW COMPARISON:  03/11/2017 FINDINGS: Cardiac shadow is enlarged but stable. Tracheostomy tube, feeding catheter and right-sided jugular central line are noted. The lungs are poorly aerated but no focal infiltrate is seen. Mild crowding of the vascular markings is noted related to the poor inspiratory effort. No bony abnormality is seen. IMPRESSION: Stable cardiomegaly. Poor inspiratory effort although no focal infiltrate is seen. Electronically Signed   By: Inez Catalina M.D.   On: 03/17/2017 13:48     Medications:   . sodium chloride 10 mL/hr at 03/16/17 1634  . sodium chloride    . sodium chloride    . sodium chloride    . sodium chloride    . sodium chloride    . sodium chloride    . cefTAZidime (FORTAZ)  IV Stopped (03/17/17 2018)  . vancomycin 1,000 mg (03/17/17 1757)   . calcium carbonate (dosed in mg elemental calcium)  250 mg of elemental calcium Oral TID  . carvedilol  12.5 mg Oral BID WC  . chlorhexidine gluconate (MEDLINE KIT)  15 mL Mouth Rinse BID  . darbepoetin (ARANESP) injection - DIALYSIS  200 mcg Intravenous Q Tue-HD  . feeding supplement (NEPRO CARB STEADY)  1,000 mL Per Tube Q24H  . feeding supplement (PRO-STAT SUGAR FREE 64)  60 mL Per Tube QID  . [name]'s butt cream   Topical QID  . heparin  5,000 Units Subcutaneous Q8H  . insulin aspart  0-15 Units Subcutaneous Q4H  . mouth rinse  15 mL Mouth Rinse QID  . multivitamin  1 tablet Oral QHS  . pantoprazole sodium  40 mg Per Tube Q24H  . potassium & sodium phosphates  1 packet Per Tube TID WC & HS  . sodium chloride flush  10-40 mL Intracatheter Q12H   sodium chloride, sodium chloride, sodium chloride, sodium  chloride, sodium chloride, sodium chloride, acetaminophen (TYLENOL) oral liquid 160 mg/5 mL, acetaminophen **OR** acetaminophen, albuterol, fentaNYL (SUBLIMAZE) injection, heparin, heparin, HYDROmorphone (DILAUDID) injection, lidocaine (PF), lidocaine-prilocaine, metoCLOPramide **OR** metoCLOPramide (REGLAN) injection, metoprolol tartrate, ondansetron **OR** ondansetron (ZOFRAN) IV, pentafluoroprop-tetrafluoroeth, sodium chloride flush  Assessment/ Plan:  1. 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 VDRF: trach- dependent, off vent now 3. 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 ESRD - HD TTS, 4h 96mn 5. Anemia - HGB 7.6 . Cont ESA. No Fe D/T osteo.  6. Secondary hyperparathyroidism - Ca 8.7  Phos 3.9, s/pCa++ supp, now on250 tid CaCO3  7. HTN/volume - BP's ok, cont coreg at 12.5 bid (25 bid at home), holding home isosorbide 8. Nutrition: Albumin 2.1 On tube feeding prostat/nepro  9. DM: per primary 10. NICM H/O diastolic/systolic HF    LOS: 32 Jishnu Jenniges W @TODAY @10 :34 AM

## 2017-03-18 NOTE — Progress Notes (Signed)
CSW continuing to follow for placement needs  [facility] in [place] informed CSW they are unable to accept patient with pending Medicaid or out of state medicaid- she would need [place] authorized prior to admission and she can not receive [place] Medicaid until she is a citizen of [place]  CSW is researching other options  Burna Sis, LCSW Clinical Social Worker [phone]

## 2017-03-19 LAB — COMPREHENSIVE METABOLIC PANEL
ALT: 7 U/L — AB (ref 14–54)
ANION GAP: 17 — AB (ref 5–15)
AST: 24 U/L (ref 15–41)
Albumin: 2.3 g/dL — ABNORMAL LOW (ref 3.5–5.0)
Alkaline Phosphatase: 288 U/L — ABNORMAL HIGH (ref 38–126)
BUN: 83 mg/dL — ABNORMAL HIGH (ref 6–20)
CALCIUM: 8.9 mg/dL (ref 8.9–10.3)
CHLORIDE: 99 mmol/L — AB (ref 101–111)
CO2: 24 mmol/L (ref 22–32)
CREATININE: 4.71 mg/dL — AB (ref 0.44–1.00)
GFR, EST AFRICAN AMERICAN: 13 mL/min — AB (ref >60)
GFR, EST NON AFRICAN AMERICAN: 11 mL/min — AB (ref >60)
Glucose, Bld: 201 mg/dL — ABNORMAL HIGH (ref 65–99)
Potassium: 3.8 mmol/L (ref 3.5–5.1)
SODIUM: 140 mmol/L (ref 135–145)
Total Bilirubin: 1.3 mg/dL — ABNORMAL HIGH (ref 0.3–1.2)
Total Protein: 6.9 g/dL (ref 6.5–8.1)

## 2017-03-19 LAB — C DIFFICILE QUICK SCREEN W PCR REFLEX
C DIFFICLE (CDIFF) ANTIGEN: NEGATIVE
C Diff interpretation: NOT DETECTED
C Diff toxin: NEGATIVE

## 2017-03-19 LAB — CBC
HCT: 28.5 % — ABNORMAL LOW (ref 36.0–46.0)
HEMOGLOBIN: 8.2 g/dL — AB (ref 12.0–15.0)
MCH: 28.5 pg (ref 26.0–34.0)
MCHC: 28.8 g/dL — ABNORMAL LOW (ref 30.0–36.0)
MCV: 99 fL (ref 78.0–100.0)
Platelets: 438 10*3/uL — ABNORMAL HIGH (ref 150–400)
RBC: 2.88 MIL/uL — AB (ref 3.87–5.11)
RDW: 17.5 % — ABNORMAL HIGH (ref 11.5–15.5)
WBC: 18.6 10*3/uL — AB (ref 4.0–10.5)

## 2017-03-19 LAB — GLUCOSE, CAPILLARY
GLUCOSE-CAPILLARY: 142 mg/dL — AB (ref 65–99)
GLUCOSE-CAPILLARY: 161 mg/dL — AB (ref 65–99)
Glucose-Capillary: 175 mg/dL — ABNORMAL HIGH (ref 65–99)
Glucose-Capillary: 176 mg/dL — ABNORMAL HIGH (ref 65–99)
Glucose-Capillary: 124 mg/dL — ABNORMAL HIGH (ref 65–99)
Glucose-Capillary: 137 mg/dL — ABNORMAL HIGH (ref 65–99)

## 2017-03-19 LAB — MAGNESIUM: Magnesium: 2.2 mg/dL (ref 1.7–2.4)

## 2017-03-19 MED ORDER — SODIUM CHLORIDE 0.9 % IV SOLN: 100.0000 mL | INTRAVENOUS | Status: DC | PRN

## 2017-03-19 MED ORDER — SACCHAROMYCES BOULARDII 250 MG PO CAPS: 250.0000 mg | ORAL_CAPSULE | Freq: Two times a day (BID) | ORAL | Status: DC

## 2017-03-19 MED ORDER — LIDOCAINE-PRILOCAINE 2.5-2.5 % EX CREA: 1.0000 "application " | TOPICAL_CREAM | CUTANEOUS | Status: DC | PRN

## 2017-03-19 MED ORDER — PENTAFLUOROPROP-TETRAFLUOROETH EX AERO: 1.0000 "application " | INHALATION_SPRAY | CUTANEOUS | Status: DC | PRN

## 2017-03-19 MED ORDER — ALBUMIN HUMAN 25 % IV SOLN
INTRAVENOUS | Status: AC
  Administered 2017-03-19: 25 g via INTRAVENOUS
  Filled 2017-03-19: qty 200

## 2017-03-19 MED ORDER — LIDOCAINE HCL (PF) 1 % IJ SOLN: 5.0000 mL | INTRAMUSCULAR | Status: DC | PRN

## 2017-03-19 MED ORDER — METOPROLOL TARTRATE 5 MG/5ML IV SOLN
2.5000 mg | INTRAVENOUS | Status: DC | PRN
  Administered 2017-03-20 – 2017-03-22 (×5): 5 mg via INTRAVENOUS
  Administered 2017-03-26: 2.5 mg via INTRAVENOUS
  Filled 2017-03-19 (×7): qty 5

## 2017-03-19 MED ORDER — SACCHAROMYCES BOULARDII 250 MG PO CAPS
250.0000 mg | ORAL_CAPSULE | Freq: Two times a day (BID) | ORAL | Status: DC
  Administered 2017-03-20 – 2017-04-10 (×39): 250 mg via ORAL
  Filled 2017-03-19 (×39): qty 1

## 2017-03-19 MED ORDER — ALTEPLASE 2 MG IJ SOLR
2.0000 mg | Freq: Once | INTRAMUSCULAR | Status: DC | PRN
  Filled 2017-03-19: qty 2

## 2017-03-19 MED ORDER — SODIUM CHLORIDE 0.9 % IV BOLUS (SEPSIS)
500.0000 mL | Freq: Once | INTRAVENOUS | Status: AC
  Administered 2017-03-19: 250 mL via INTRAVENOUS

## 2017-03-19 MED ORDER — HEPARIN SODIUM (PORCINE) 1000 UNIT/ML DIALYSIS: 1000.0000 [IU] | INTRAMUSCULAR | Status: DC | PRN

## 2017-03-19 MED ORDER — ALBUMIN HUMAN 25 % IV SOLN
25.0000 g | Freq: Once | INTRAVENOUS | Status: AC
  Administered 2017-03-19: 25 g via INTRAVENOUS

## 2017-03-19 NOTE — Progress Notes (Signed)
PROGRESS NOTE        PATIENT DETAILS Name: [name] Age: 36 y.o. Sex: female Date of Birth: 10-Mar-1981 Admit Date: 02/14/2017 Admitting Physician Lady Deutscher, MD [id number], Helene Kelp, FNP  Note-care assumed by me on 6/13  Brief Narrative: Patient is a 35 y.o. female with history of ESRD on HD, poorly controlled diabetes, history of bilateral transmetatarsal foot amputation with subsequent left foot wound dehiscence (refused BKA as outpatient), chronic systolic heart failure felt to be secondary to nonischemic cardiomyopathy admitted on 02/14/17 with wound dehiscence and persistent drainage from the left trans-metatarsal amputation site, she was admitted to the [facility]. She was subsequently found to be unresponsive in her room on 5/19 after getting IV Dilaudid-she was  in asystole, CPR was started, with ROSC in around 9 minutes.She was then transferred to the intensive care unit, and subsequently underwent cooling and then rewarming. She was unable to be liberated off the ventilator, and as a result underwent tracheostomy on 5/31. Patient has been evaluated by neurology during this hospital stay, per neurology chances of meaningful neurological recovery is dismal. Hospital course has now been complicated by persistent leukocytosis, respiratory failure, and wound dehiscence with gangrene causing persistent SIRS pathophysiology. 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 On ATC overnight . Continue attempts to wean/liberate off ventilator, PCCM following for resp issues -tracheostomy in place. Trach replaced for same size #6. Critical care evaluated patient 6/18 recommend maintaining current Trach  Size.  If  improvement continues, and she is able to tolerate more weaning trial, she may be ready for Swallow study and PMV training, holding  off on PEG for now. Repeat speech therapy evaluation still pending   Diarrhea reglab dc'd Add probiotic    Cardiac arrest on 5/19 with anoxic brain injury: She seems to be slowly improving, following all major commands. Evaluated by neurology on 5/29-felt to have poor overall prognoses for any meaningful recovery.    Systemic inflammatory response syndrome:/Persistent leukocytosis Continues to be intermittently febrile (low-grade), tachycardic-and has persistent leukocytosis white count slightly improved from 23.3-> 18.6  which is slowly improving. Likely secondary to left foot gangrene/infection and abscess-she is now status post AKA and still continues to have these symptoms. Blood cultures on 6/1, 6/13 continue to be negative. . Chest x-ray on 6/13 negative for pneumonia. C. difficile studies on 6/6 negative,   repeat c diff pending , check due to prolonged course of antibiotics .Still has a rectal tube in place. Marland Kitchen She was on vancomycin and Tressie Ellis that was started on 6/14, and subsequently discontinued on 6/16 after AKA. Spoke with Dr Linus Salmons- who has evaluated this patient in the past-he recommends  vancomycin and Fortaz through 6/20, now completed . Fever curve has improved    Left trans-metatarsal stump wound dehiscence with infection and gangrene: Per operative note-pus/abscess noted extending up to the knee when BKA was attempted, subsequently underwent a AKA. Note, initially family was very reluctant to proceed with amputation, but given persistent leukocytosis/fever and potential risk of sepsis, family agreed for amputation, subsequently AKA was done on 6/15.  She  has a wound VAC in place which will  be continued for 1 week postoperatively until 6/22. Pain seems to be under control  ESRD: Nephrology following-HD on TTS.   Anemia: Hemoglobin 7.5-9.0  Likely  secondary to chronic disease-probably worsened by acute illness. No signs of bleeding, hemoglobin has been stable around 8.2  DM-2: CBGs stable-continue with SSI.   Chronic systolic heart failure/nonischemic cardiomyopathy (EF 30-35% by TEE on 6/4): Volume status remained stable-this is managed with dialysis-continue with Coreg.   Pericardial effusion: Seen on TEE-with no evidence of tamponade pathophysiology. Repeat echo at some point in the next few weeks.  Moderate protein calorie malnutrition: Continue NG tube feedings  Hypokalemia-recheck labs tomorrow  Goals of care: Unfortunate 36 year old female with ESRD, cardiomyopathy, bilateral transmetatarsal amputations complicated by left stump dehiscence-suffered a cardiac arrest on admission-now with anoxic brain injury-ventilator dependent-tracheostomy in place, awaiting  initiation of oral feeding. Hospital course has been prolonged-complicated by failure to wean from the ventilator, persistent systemic inflammatory response syndrome due to left foot gangrene/infection-after extensive discussion with family-she remains a full code, with family choosing to continue with full aggressive care. 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 Her prognosis is still very guarded at this time, will await attempts to see if she can be liberated off the ventilator. Family is aware of the risk of decompensation and deterioration.     Telemetry (independently reviewed): Sinus tachycardia  Studies: Lower extremity ultrasound 5/18 >> no evidence of DVT CT head 5/19 >> normal exam Echo 5/20 >> EF 30-35%, increased LVF. Diffuse hypokinesis, akinesis of basilar mid-inferior myocardium, grade 2 diastolic dysfxn  EEG 0/99 >> finding c/w mod to severe global cerebral dysfxn. C/w anoxic injury given clinical course  LE [place] 5/21 >> negative MRI brain 5/24 >> ?thrombosed cortical vein, otherwise normal TEE 6/4 >> mild MR, normal AV, mild TR, mild  PR, EF 30-35%, diffuse hypokinesis, no thrombus, no PFO, normal RV, moderate pericardial effusion with synechia suggesting some chronicity, no vegetations   Echo (reviewed):EF 30-35% on TEE done on 6/4  Morning labs/Imaging ordered: yes  DVT Prophylaxis: Prophylactic Heparin   Code Status: Full code   Family Communication: Mother at bedside, discussed on a daily basis  Disposition Plan: Remain inpatient in SDU  Antimicrobial agents: Anti-infectives    Start     Dose/Rate Route Frequency Ordered Stop   03/17/17 1800  cefTAZidime (FORTAZ) 2 g in dextrose 5 % 50 mL IVPB  Status:  Discontinued     2 g 100 mL/hr over 30 Minutes Intravenous Every T-Th-Sa (1800) 03/15/17 1143 03/18/17 1223   03/17/17 1200  vancomycin (VANCOCIN) IVPB 1000 mg/200 mL premix  Status:  Discontinued     1,000 mg 200 mL/hr over 60 Minutes Intravenous Every T-Th-Sa (Hemodialysis) 03/15/17 1143 03/18/17 1223   03/14/17 1200  vancomycin (VANCOCIN) IVPB 1000 mg/200 mL premix  Status:  Discontinued     1,000 mg 200 mL/hr over 60 Minutes Intravenous Every T-Th-Sa (Hemodialysis) 03/12/17 0912 03/14/17 1542   03/13/17 1430  ceFAZolin (ANCEF) IVPB 2g/100 mL premix  Status:  Discontinued     2 g 200 mL/hr over 30 Minutes Intravenous To ShortStay Surgical 03/12/17 1208 03/13/17 1113   03/13/17 1115  ceFAZolin (ANCEF) IVPB 2g/100 mL premix     2 g 200 mL/hr over 30 Minutes Intravenous To Surgery 03/13/17 1108 03/13/17 1506   03/12/17 1800  cefTAZidime (FORTAZ) 2 g in dextrose 5 % 50 mL IVPB  Status:  Discontinued     2 g 100 mL/hr over  30 Minutes Intravenous Every T-Th-Sa (1800) 03/12/17 0912 03/14/17 1542   03/12/17 1200  vancomycin (VANCOCIN) 1,500 mg in sodium chloride 0.9 % 250 mL IVPB     1,500 mg 250 mL/hr over 60 Minutes Intravenous Every Thu (Hemodialysis) 03/12/17 0912 03/12/17 1356   03/02/17 1200  cefTRIAXone (ROCEPHIN) 2 g in dextrose 5 % 50 mL IVPB     2 g 100 mL/hr over 30 Minutes Intravenous Every 24  hours 03/02/17 0810 03/06/17 1358   03/02/17 1200  vancomycin (VANCOCIN) IVPB 1000 mg/200 mL premix     1,000 mg 200 mL/hr over 60 Minutes Intravenous Every M-W-F (Hemodialysis) 03/02/17 0815 03/02/17 1457   03/02/17 1200  metroNIDAZOLE (FLAGYL) IVPB 500 mg     500 mg 100 mL/hr over 60 Minutes Intravenous Every 8 hours 03/02/17 0948 03/06/17 2230   02/26/17 1200  vancomycin (VANCOCIN) IVPB 1000 mg/200 mL premix     1,000 mg 200 mL/hr over 60 Minutes Intravenous Every T-Th-Sa (Hemodialysis) 02/25/17 1151 03/05/17 1325   02/25/17 1200  vancomycin (VANCOCIN) 2,000 mg in sodium chloride 0.9 % 500 mL IVPB     2,000 mg 250 mL/hr over 120 Minutes Intravenous  Once 02/25/17 1149 02/25/17 1449   02/25/17 0900  cefTRIAXone (ROCEPHIN) 2 g in dextrose 5 % 50 mL IVPB  Status:  Discontinued     2 g 100 mL/hr over 30 Minutes Intravenous Every 24 hours 02/25/17 0814 03/02/17 0810   02/25/17 0900  metroNIDAZOLE (FLAGYL) IVPB 500 mg  Status:  Discontinued     500 mg 100 mL/hr over 60 Minutes Intravenous Every 8 hours 02/25/17 0814 03/02/17 0948   02/20/17 1200  vancomycin (VANCOCIN) IVPB 1000 mg/200 mL premix  Status:  Discontinued     1,000 mg 200 mL/hr over 60 Minutes Intravenous Every M-W-F (Hemodialysis) 02/19/17 1506 02/20/17 1403   02/17/17 1800  meropenem (MERREM) 500 mg in sodium chloride 0.9 % 50 mL IVPB  Status:  Discontinued     500 mg 100 mL/hr over 30 Minutes Intravenous Daily-1800 02/17/17 1026 02/25/17 0814   02/17/17 1200  vancomycin (VANCOCIN) IVPB 1000 mg/200 mL premix     1,000 mg 200 mL/hr over 60 Minutes Intravenous Every T-Th-Sa (Hemodialysis) 02/14/17 1758 02/19/17 1612   02/14/17 2200  clindamycin (CLEOCIN) IVPB 900 mg  Status:  Discontinued     900 mg 100 mL/hr over 30 Minutes Intravenous Every 8 hours 02/14/17 1106 02/14/17 1928   02/14/17 2000  clindamycin (CLEOCIN) IVPB 900 mg  Status:  Discontinued     900 mg 100 mL/hr over 30 Minutes Intravenous Every 8 hours 02/14/17  1928 02/16/17 1047   02/14/17 1915  piperacillin-tazobactam (ZOSYN) IVPB 3.375 g  Status:  Discontinued     3.375 g 100 mL/hr over 30 Minutes Intravenous Every 12 hours 02/14/17 1801 02/17/17 1026   02/14/17 1845  vancomycin (VANCOCIN) 2,000 mg in sodium chloride 0.9 % 500 mL IVPB     2,000 mg 250 mL/hr over 120 Minutes Intravenous  Once 02/14/17 1758 02/14/17 2107   02/14/17 1115  clindamycin (CLEOCIN) IVPB 900 mg  Status:  Discontinued     900 mg 100 mL/hr over 30 Minutes Intravenous  Once 02/14/17 1106 02/14/17 2236   02/14/17 0945  clindamycin (CLEOCIN) IVPB 600 mg     600 mg 100 mL/hr over 30 Minutes Intravenous  Once 02/14/17 0931 02/14/17 1013      Procedures: ETT 5/19 >> 5/31 Lt IJ CVL 5/19 >> out Trach 5/31 >>  TEE  6/4 >> mild MR, normal AV, mild TR, mild PR, EF 30-35%, diffuse hypokinesis, no thrombus, no PFO, normal RV, moderate pericardial effusion with synechia suggesting some chronicity, no vegetations  Rt IJ TLC 6/13>> Left AKA 6/15>>  CONSULTS:  Infectious disease:  Neurology  Gastroenterology  Wound care  PCCM  Nephrology  Orthopedics  Time spent: 30 minutes-Greater than 50% of this time was spent in counseling, explanation of diagnosis, planning of further management, and coordination of care.  MEDICATIONS: Scheduled Meds: . calcium carbonate (dosed in mg elemental calcium)  250 mg of elemental calcium Oral TID  . carvedilol  12.5 mg Oral BID WC  . chlorhexidine gluconate (MEDLINE KIT)  15 mL Mouth Rinse BID  . darbepoetin (ARANESP) injection - DIALYSIS  200 mcg Intravenous Q Tue-HD  . feeding supplement (NEPRO CARB STEADY)  1,000 mL Per Tube Q24H  . feeding supplement (PRO-STAT SUGAR FREE 64)  60 mL Per Tube QID  . [name]'s butt cream   Topical QID  . heparin  5,000 Units Subcutaneous Q8H  . insulin aspart  0-15 Units Subcutaneous Q4H  . mouth rinse  15 mL Mouth Rinse QID  . multivitamin  1 tablet Oral QHS  . pantoprazole sodium  40 mg Per  Tube Q24H  . potassium & sodium phosphates  1 packet Per Tube TID WC & HS  . sodium chloride flush  10-40 mL Intracatheter Q12H   Continuous Infusions: . sodium chloride 10 mL/hr at 03/16/17 1634  . sodium chloride    . sodium chloride    . sodium chloride    . sodium chloride    . sodium chloride    . sodium chloride     PRN Meds:.sodium chloride, sodium chloride, sodium chloride, sodium chloride, sodium chloride, sodium chloride, acetaminophen (TYLENOL) oral liquid 160 mg/5 mL, acetaminophen **OR** acetaminophen, albuterol, alteplase, fentaNYL (SUBLIMAZE) injection, heparin, heparin, HYDROmorphone (DILAUDID) injection, lidocaine (PF), lidocaine-prilocaine, metoprolol tartrate, ondansetron **OR** ondansetron (ZOFRAN) IV, pentafluoroprop-tetrafluoroeth, sodium chloride flush   PHYSICAL EXAM: Vital signs: Vitals:   03/19/17 1116 03/19/17 1130 03/19/17 1145 03/19/17 1200  BP: 101/72 91/67 (!) 89/71 91/71  Pulse: (!) 111 (!) 111 (!) 110 (!) 111  Resp: (!) _0 (!) 32  Temp:      TempSrc:      SpO2: 100% 100% 100% 99%  Weight:      Height:       Filed Weights   03/18/17 0455 03/19/17 0450 03/19/17 0847  Weight: 83 kg (182 lb 15.7 oz) 84 kg (185 lb 3 oz) 84 kg (185 lb 3 oz)   Body mass index is 27.75 kg/m.  General appearance:Sleeping when I walked in-easily arouses, squeeze my hands. Eyes: No scleral icterus HEENT: Atraumatic and Normocephalic Neck: supple, tracheostomy in place Resp:Good air entry bilaterally, some transmitted upper airway sounds CVS: S1 S2 regular, tachycardic GI: Bowel sounds present, Non tender and not distended with no gaurding, rigidity or rebound. Extremities: Left AKA stump-vac in place. Musculoskeletal:No digital cyanosis  I have personally reviewed following labs and imaging studies  LABORATORY DATA: CBC:  Recent Labs Lab 03/14/17 0449 03/15/17 0455 03/16/17 0444 03/17/17 0405 03/19/17 0504  WBC 22.8* 24.9* 23.3* 19.4* 18.6*    NEUTROABS 19.6*  --   --   --   --   HGB 7.6* 7.5* 7.6* 7.6* 8.2*  HCT 25.7* 25.5* 25.4* 26.1* 28.5*  MCV 96.6 98.1 95.5 95.3 99.0  PLT 334 359 371 402* 438*    Basic Metabolic Panel:  Recent Labs Lab 03/13/17 0557 03/14/17 0449 03/15/17 0455 03/17/17 0405 03/19/17 0504  NA 136 136 135 136 140  K 4.1 4.4 3.5 3.1* 3.8  CL 96* 97* 95* 97* 99*  CO2 _0 GLUCOSE 172* 146* 157* 175* 201*  BUN 45* 63* 29* 95* 83*  CREATININE 3.78* 5.26* 3.17* 5.81* 4.71*  CALCIUM 9.0 8.6* 8.3* 8.7* 8.9  MG  --   --   --   --  2.2  PHOS 3.0 5.1* 3.9  --   --     GFR: Estimated Creatinine Clearance: 18.8 mL/min (A) (by C-G formula based on SCr of 4.71 mg/dL (H)).  Liver Function Tests:  Recent Labs Lab 03/13/17 0557 03/14/17 0449 03/15/17 0455 03/17/17 0405 03/19/17 0504  AST  --   --   --  21 24  ALT  --   --   --  6* 7*  ALKPHOS  --   --   --  227* 288*  BILITOT  --   --   --  1.5* 1.3*  PROT  --   --   --  6.3* 6.9  ALBUMIN 2.4* 2.4* 2.2* 2.1* 2.3*   No results for input(s): LIPASE, AMYLASE in the last 168 hours. No results for input(s): AMMONIA in the last 168 hours.  Coagulation Profile: No results for input(s): INR, PROTIME in the last 168 hours.  Cardiac Enzymes: No results for input(s): CKTOTAL, CKMB, CKMBINDEX, TROPONINI in the last 168 hours.  BNP (last 3 results) No results for input(s): PROBNP in the last 8760 hours.  HbA1C: No results for input(s): HGBA1C in the last 72 hours.  CBG:  Recent Labs Lab 03/18/17 1559 03/18/17 2058 03/18/17 2339 03/19/17 0444 03/19/17 0736  GLUCAP 164* 165* 142* 175* 161*    Lipid Profile: No results for input(s): CHOL, HDL, LDLCALC, TRIG, CHOLHDL, LDLDIRECT in the last 72 hours.  Thyroid Function Tests: No results for input(s): TSH, T4TOTAL, FREET4, T3FREE, THYROIDAB in the last 72 hours.  Anemia Panel: No results for input(s): VITAMINB12, FOLATE, FERRITIN, TIBC, IRON, RETICCTPCT in the last 72  hours.  Urine analysis:    Component Value Date/Time   COLORURINE AMBER (A) 02/14/2017 2111   APPEARANCEUR HAZY (A) 02/14/2017 2111   LABSPEC 1.015 02/14/2017 2111   PHURINE 5.0 02/14/2017 2111   GLUCOSEU NEGATIVE 02/14/2017 2111   HGBUR SMALL (A) 02/14/2017 2111   BILIRUBINUR NEGATIVE 02/14/2017 2111   KETONESUR NEGATIVE 02/14/2017 2111   PROTEINUR 30 (A) 02/14/2017 2111   UROBILINOGEN 0.2 09/21/2014 1908   NITRITE NEGATIVE 02/14/2017 2111   LEUKOCYTESUR TRACE (A) 02/14/2017 2111    Sepsis Labs: Lactic Acid, Venous    Component Value Date/Time   LATICACIDVEN 4.6 ([place]) 02/14/2017 1807    MICROBIOLOGY: Recent Results (from the past 240 hour(s))  Culture, blood (routine x 2)     Status: None   Collection Time: 03/11/17  8:42 AM  Result Value Ref Range Status   Specimen Description BLOOD RIGHT HAND  Final   Special Requests   Final    BOTTLES DRAWN AEROBIC ONLY Blood Culture adequate volume   Culture NO GROWTH 5 DAYS  Final   Report Status 03/16/2017 FINAL  Final  Culture, blood (routine x 2)     Status: None   Collection Time: 03/11/17  8:46 AM  Result Value Ref Range Status   Specimen Description BLOOD RIGHT HAND  Final   Special Requests   Final    BOTTLES DRAWN  AEROBIC AND ANAEROBIC Blood Culture adequate volume   Culture NO GROWTH 5 DAYS  Final   Report Status 03/16/2017 FINAL  Final    RADIOLOGY STUDIES/RESULTS: Ct Abdomen Wo Contrast  Result Date: 03/02/2017 CLINICAL DATA:  Preop for gastrostomy to EXAM: CT ABDOMEN WITHOUT CONTRAST TECHNIQUE: Multidetector CT imaging of the abdomen was performed following the standard protocol without IV contrast. COMPARISON:  None. FINDINGS: Lower chest: Bibasilar dependent and medial consolidation. Large pericardial effusion. Hepatobiliary: The left lobe of the liver is prominent and anterior to the stoma. The gallbladder is not clearly visualized. It may either be decompressed or absent due to cholecystectomy. Pancreas:  Unremarkable Spleen: Unremarkable Adrenals/Urinary Tract: Kidneys and adrenal glands are within normal limits. Stomach/Bowel: The stomach is positioned deep to the liver and colon. Gastrostomy tube placement may be problematic. No evidence of small-bowel obstruction. Feeding tube tip is in the proximal duodenum. Vascular/Lymphatic: Small para-aortic lymph nodes. Atherosclerotic vascular calcifications are noted. No evidence of aortic aneurysm. Other: No free-fluid. Musculoskeletal: No vertebral compression deformity. IMPRESSION: The stomach is positioned deep to a prominent left lobe of the liver as well as the transverse colon. Gas is distension of the stomach and barium opacification of the transverse colon will be essential during the procedure. Bibasilar pulmonary consolidation. Large pericardial effusion. Electronically Signed   By: Marybelle Killings M.D.   On: 03/02/2017 07:18   Mr Brain Wo Contrast  Result Date: 02/18/2017 CLINICAL DATA:  Anoxic brain injury status post cardiac arrest EXAM: MRI HEAD WITHOUT CONTRAST TECHNIQUE: Multiplanar, multiecho pulse sequences of the brain and surrounding structures were obtained without intravenous contrast. IV contrast could not be administered due to the patient's poor renal function. COMPARISON:  Head CT 02/14/2017 FINDINGS: Brain: The midline structures are normal. There is no focal diffusion restriction to indicate acute infarct. The brain parenchymal signal is normal and there is no mass lesion. There is a focal susceptibility abnormality at the left insula (series 12, image 55). Brain volume is normal for age without age-advanced or lobar predominant atrophy. The dura is normal and there is no extra-axial collection. Vascular: Major intracranial arterial and venous sinus flow voids are preserved. Skull and upper cervical spine: The visualized skull base, calvarium, upper cervical spine and extracranial soft tissues are normal. Sinuses/Orbits: No fluid levels or  advanced mucosal thickening. No mastoid effusion. Normal orbits. IMPRESSION: 1. Focus of susceptibility in the left insula may indicate a thrombosed cortical vein. The location does not correspond to a large artery. 2. No associated ischemia or hemorrhage.  Otherwise normal brain. Electronically Signed   By: Ulyses Jarred M.D.   On: 02/18/2017 14:55   Ir Fluoro Guide Cv Line Right  Result Date: 03/11/2017 INDICATION: Osteomyelitis EXAM: TUNNELED RIGHT JUGULAR PICC LINE PLACEMENT WITH ULTRASOUND AND FLUOROSCOPIC GUIDANCE MEDICATIONS: None. ANESTHESIA/SEDATION: None FLUOROSCOPY TIME:  Fluoroscopy Time:  minutes 30 seconds (1 mGy). COMPLICATIONS: None immediate. PROCEDURE: The patient was advised of the possible risks and complications and agreed to undergo the procedure. The patient was then brought to the angiographic suite for the procedure. The right neck was prepped with chlorhexidine, draped in the usual sterile fashion using maximum barrier technique (cap and mask, sterile gown, sterile gloves, large sterile sheet, hand hygiene and cutaneous antiseptic). Local anesthesia was attained by infiltration with 1% lidocaine. Ultrasound demonstrated patency of the right jugular vein, and this was documented with an image. Under real-time ultrasound guidance, this vein was accessed with a 21 gauge micropuncture needle and image documentation was performed.  A long subcutaneous tract was employed. The needle was exchanged over a guidewire for a peel-away sheath through which a 26 cm 5 [place] double lumen power injectable PICC was advanced, and positioned with its tip at the lower SVC/right atrial junction. The cuff was positioned in the subcutaneous tract. Fluoroscopy during the procedure and fluoro spot radiograph confirms appropriate catheter position. The catheter was flushed, secured to the skin with Prolene sutures, and covered with a sterile dressing. IMPRESSION: Successful placement of a tunneled right jugular  PICC with sonographic and fluoroscopic guidance. The catheter is ready for use. Electronically Signed   By: Marybelle Killings M.D.   On: 03/11/2017 15:31   Ir US Guide Vasc Access Right  Result Date: 03/11/2017 INDICATION: Osteomyelitis EXAM: TUNNELED RIGHT JUGULAR PICC LINE PLACEMENT WITH ULTRASOUND AND FLUOROSCOPIC GUIDANCE MEDICATIONS: None. ANESTHESIA/SEDATION: None FLUOROSCOPY TIME:  Fluoroscopy Time:  minutes 30 seconds (1 mGy). COMPLICATIONS: None immediate. PROCEDURE: The patient was advised of the possible risks and complications and agreed to undergo the procedure. The patient was then brought to the angiographic suite for the procedure. The right neck was prepped with chlorhexidine, draped in the usual sterile fashion using maximum barrier technique (cap and mask, sterile gown, sterile gloves, large sterile sheet, hand hygiene and cutaneous antiseptic). Local anesthesia was attained by infiltration with 1% lidocaine. Ultrasound demonstrated patency of the right jugular vein, and this was documented with an image. Under real-time ultrasound guidance, this vein was accessed with a 21 gauge micropuncture needle and image documentation was performed. A long subcutaneous tract was employed. The needle was exchanged over a guidewire for a peel-away sheath through which a 26 cm 5 [place] double lumen power injectable PICC was advanced, and positioned with its tip at the lower SVC/right atrial junction. The cuff was positioned in the subcutaneous tract. Fluoroscopy during the procedure and fluoro spot radiograph confirms appropriate catheter position. The catheter was flushed, secured to the skin with Prolene sutures, and covered with a sterile dressing. IMPRESSION: Successful placement of a tunneled right jugular PICC with sonographic and fluoroscopic guidance. The catheter is ready for use. Electronically Signed   By: Marybelle Killings M.D.   On: 03/11/2017 15:31   Dg Chest Port 1 View  Result Date:  03/17/2017 CLINICAL DATA:  Shortness of Breath EXAM: PORTABLE CHEST 1 VIEW COMPARISON:  03/11/2017 FINDINGS: Cardiac shadow is enlarged but stable. Tracheostomy tube, feeding catheter and right-sided jugular central line are noted. The lungs are poorly aerated but no focal infiltrate is seen. Mild crowding of the vascular markings is noted related to the poor inspiratory effort. No bony abnormality is seen. IMPRESSION: Stable cardiomegaly. Poor inspiratory effort although no focal infiltrate is seen. Electronically Signed   By: Inez Catalina M.D.   On: 03/17/2017 13:48   Dg Chest Port 1 View  Result Date: 03/11/2017 CLINICAL DATA:  Leukocytosis. EXAM: PORTABLE CHEST 1 VIEW COMPARISON:  03/03/2017 FINDINGS: Patient is rotated to the left. The cardio pericardial silhouette is enlarged. Slight improvement in left base aeration. Tracheostomy tube remains in place. A feeding tube passes into the stomach although the distal tip position is not included on the film. The visualized bony structures of the thorax are intact. Telemetry leads overlie the chest. IMPRESSION: Persistent enlargement of the cardiopericardial silhouette without evidence for overt pulmonary edema or substantial pleural effusion. Electronically Signed   By: Misty Stanley M.D.   On: 03/11/2017 08:37   Dg Chest Port 1 View  Result Date: 03/03/2017 CLINICAL DATA:  Respiratory  failure. EXAM: PORTABLE CHEST 1 VIEW COMPARISON:  03/01/2017 . FINDINGS: Tracheostomy tube and feeding tube in stable position. Cardiomegaly with mild diffuse interstitial prominence suggesting mild CHF. Persistent atelectasis and consolidation left lower lobe. Small left pleural effusion cannot be excluded. No pneumothorax. IMPRESSION: 1. Tracheostomy tube and feeding tube in stable position. 2. Cardiomegaly with diffuse mild interstitial prominence suggesting mild CHF. 3. Persistent left lower lobe atelectasis and consolidation. Small left pleural effusion cannot be excluded  . Electronically Signed   By: Marcello Moores  Register   On: 03/03/2017 07:09   Dg Chest Port 1 View  Result Date: 03/01/2017 CLINICAL DATA:  Respiratory failure EXAM: PORTABLE CHEST 1 VIEW COMPARISON:  02/27/2017 FINDINGS: 0546 hours. Patient rotated to the left. Tracheostomy tube remains in place. A feeding tube passes into the stomach although the distal tip position is not included on the film. The cardio pericardial silhouette is enlarged. Left base collapse/consolidation again noted. There is pulmonary vascular congestion without overt pulmonary edema. IMPRESSION: Rotated film with cardiomegaly and vascular congestion. Persistent left base collapse/ consolidation. Electronically Signed   By: Misty Stanley M.D.   On: 03/01/2017 07:43   Dg Chest Port 1 View  Result Date: 02/27/2017 CLINICAL DATA:  Hypoxia. EXAM: PORTABLE CHEST 1 VIEW COMPARISON:  02/26/2017. FINDINGS: Tracheostomy to in stable position. Stable cardiomegaly. Low lung volumes. Progressive left lower lobe atelectasis and infiltrate. Small left pleural effusion cannot be excluded . IMPRESSION: 1. Tracheostomy tube in stable position. 2. Progressive left lower lobe atelectasis and infiltrate. Small left pleural effusion cannot be excluded . Electronically Signed   By: Marcello Moores  Register   On: 02/27/2017 07:01   Dg Chest Port 1 View  Result Date: 02/26/2017 CLINICAL DATA:  Status post tracheostomy placement. EXAM: PORTABLE CHEST 1 VIEW COMPARISON:  Earlier today. FINDINGS: The endotracheal tube has been removed and replaced with a tracheostomy tube in satisfactory position. The nasogastric tube and esophageal probe have been removed. The left jugular catheter has been removed. No pneumothorax. Grossly stable enlarged cardiac silhouette. Left lower lobe opacity with improvement laterally since 02/25/2017. Clear right lung. Unremarkable bones. IMPRESSION: 1. Tracheostomy tube in satisfactory position. 2. Left lower lobe atelectasis or pneumonia with  mild improvement since 2 days ago. 3. Stable cardiomegaly. Electronically Signed   By: Claudie Revering M.D.   On: 02/26/2017 16:02   Dg Chest Port 1 View  Result Date: 02/26/2017 CLINICAL DATA:  Hypoxia.  Shortness of breath. EXAM: PORTABLE CHEST 1 VIEW COMPARISON:  02/25/2017. FINDINGS: Endotracheal tube, NG tube, left IJ line esophageal probe in stable position. Cardiomegaly with bilateral pulmonary interstitial infiltrates consistent with CHF. Left base atelectasis . Small left pleural effusion. Similar findings noted on prior exam. No pneumothorax. IMPRESSION: 1. Lines and tubes in stable position. 2. Cardiomegaly with bilateral pulmonary interstitial prominence and small left pleural effusion noted consistent CHF. Left base atelectasis. Similar findings noted on prior exam. Electronically Signed   By: [place]   On: 02/26/2017 07:18   Dg Chest Port 1 View  Result Date: 02/25/2017 CLINICAL DATA:  Respiratory failure, intubated patient. Diabetes, cardiomyopathy, morbid obesity, end-stage renal disease. EXAM: PORTABLE CHEST 1 VIEW COMPARISON:  Portable chest x-ray of Feb 24, 2017 FINDINGS: The lungs are adequately inflated. The pulmonary interstitial markings are slightly less prominent today. The pulmonary vascularity remains engorged. The cardiac silhouette remains enlarged. Retrocardiac region on the left is slightly less dense. The endotracheal tube tip lies 5.7 cm above the carina. The esophagogastric tube tip projects below the  inferior margin of the image. The left internal jugular venous catheter tip projects over the proximal SVC. IMPRESSION: Slight interval improvement in pulmonary interstitial edema. Stable cardiomegaly. Persistent left lower lobe atelectasis or pneumonia. The support tubes are in reasonable position. Electronically Signed   By: David  Martinique M.D.   On: 02/25/2017 07:57   Dg Chest Port 1 View  Result Date: 02/24/2017 CLINICAL DATA:  Respiratory failure EXAM: PORTABLE  CHEST 1 VIEW COMPARISON:  Two days ago FINDINGS: Endotracheal tube tip at the clavicular heads. An orogastric tube reaches the stomach. Esophageal thermistor. Left IJ central line with tip at the SVC level. Cardiomegaly and diffuse hazy opacity with cephalized blood flow. No definitive effusion. No pneumothorax. IMPRESSION: 1. Stable positioning of tubes and central line. 2. CHF pattern. Electronically Signed   By: Monte Fantasia M.D.   On: 02/24/2017 07:31   Dg Chest Port 1 View  Result Date: 02/22/2017 CLINICAL DATA:  Respiratory failure EXAM: PORTABLE CHEST 1 VIEW COMPARISON:  02/21/2017 FINDINGS: Endotracheal tube terminates 5 cm above the carina. Cardiomegaly with mild interstitial edema. Retrocardiac opacity, possibly reflecting a combination of atelectasis and pleural effusion, pneumonia not excluded. No pneumothorax. Left IJ venous catheter terminates at the cavoatrial junction. Enteric tube courses into the stomach. IMPRESSION: Endotracheal tube terminates 5 cm above the carina. Cardiomegaly with mild interstitial edema. Retrocardiac opacity, possibly atelectasis and pleural effusion, pneumonia not excluded. Electronically Signed   By: Julian Hy M.D.   On: 02/22/2017 07:30   Dg Chest Port 1 View  Result Date: 02/21/2017 CLINICAL DATA:  Endotracheal tube. EXAM: PORTABLE CHEST 1 VIEW COMPARISON:  02/19/2017 FINDINGS: Endotracheal tube terminates approximately 5 cm above the carina. Enteric tube courses into the left upper abdomen with tip not imaged. Left jugular catheter terminates over the mid SVC. The cardiac silhouette remains enlarged. Pulmonary vascular congestion and bilateral parenchymal lung opacities have not significantly changed. No large pleural effusion or pneumothorax is identified. IMPRESSION: Unchanged pulmonary edema. Electronically Signed   By: Logan Bores M.D.   On: 02/21/2017 07:42   Dg Chest Port 1 View  Result Date: 02/19/2017 CLINICAL DATA:  36 year old female  with respiratory failure. EXAM: PORTABLE CHEST 1 VIEW COMPARISON:  02/18/2017 and prior radiograph FINDINGS: An endotracheal tube with tip 3.5 cm above the carina, left IJ central venous catheter with tip overlying the lower SVC, and NG tube entering the stomach with tip off the field of view again noted. Pulmonary edema has decreased from the prior study. Continued left lower lung atelectasis/ consolidation again noted. There is no evidence of pneumothorax. No other changes are noted. IMPRESSION: Decreased pulmonary edema, otherwise unchanged appearance of the chest. Electronically Signed   By: Margarette Canada M.D.   On: 02/19/2017 08:03   Dg Chest Port 1 View  Result Date: 02/18/2017 CLINICAL DATA:  Respiratory failure EXAM: PORTABLE CHEST 1 VIEW COMPARISON:  02/17/2017 FINDINGS: Cardiac shadow remains enlarged. Endotracheal tube, nasogastric catheter and esophageal probe are again seen and stable. Left jugular central line is again noted and stable. No pneumothorax is seen. Diffuse vascular congestion and increased parenchymal opacity is noted consistent with progressive CHF. No focal confluent infiltrate is noted. IMPRESSION: Progressive changes of CHF. Electronically Signed   By: Inez Catalina M.D.   On: 02/18/2017 08:15   Dg Abd Portable 1v  Result Date: 03/02/2017 CLINICAL DATA:  Encounter for feeding tube placement EXAM: PORTABLE ABDOMEN - 1 VIEW COMPARISON:  Portable exam 1712 hours compared to CT abdomen and pelvis of  03/01/2017 FINDINGS: Feeding tube traverses abdomen with tip projecting over distal antrum near pylorus. Cardiac silhouette appears enlarged. Visualized bowel gas pattern normal. Osseous structures unremarkable. IMPRESSION: Tip of feeding tube projects over distal gastric antrum near pylorus. Electronically Signed   By: Lavonia Dana M.D.   On: 03/02/2017 17:21   Dg Abd Portable 1v  Result Date: 02/27/2017 CLINICAL DATA:  Feeding tube placement EXAM: PORTABLE ABDOMEN - 1 VIEW COMPARISON:   None. FINDINGS: Feeding tube with the tip projecting over the antrum of the stomach. There is no bowel dilatation to suggest obstruction. There is no evidence of pneumoperitoneum, portal venous gas or pneumatosis. There are no pathologic calcifications along the expected course of the ureters. The osseous structures are unremarkable. IMPRESSION: Feeding tube with the tip projecting over the antrum of the stomach. Electronically Signed   By: Kathreen Devoid   On: 02/27/2017 11:05   US Abdomen Limited Ruq  Result Date: 02/18/2017 CLINICAL DATA:  Elevated liver function studies. EXAM: US ABDOMEN LIMITED - RIGHT UPPER QUADRANT COMPARISON:  None. FINDINGS: Gallbladder: No gallstones or wall thickening visualized. No sonographic Murphy sign noted by sonographer. Common bile duct: Diameter: 7 mm, normal Liver: Circumscribed hyperechoic lesion with central hypoechoic appearance demonstrated along the dome of the liver measuring 2.8 cm maximal diameter. This is likely to represent a cavernous hemangioma, but the appearance on ultrasound is somewhat atypical. Consider follow-up with elective MRI of the liver for better characterization. IMPRESSION: No evidence of cholelithiasis or cholecystitis. Circumscribed hyperechoic lesion in the dome of the liver measuring 2.8 cm maximal diameter. This is probably a cavernous hemangioma but appearance is somewhat atypical. Suggest elective MRI liver in follow-up for better characterization. Electronically Signed   By: Lucienne Capers M.D.   On: 02/18/2017 02:10     LOS: 33 days   Reyne Dumas, MD  [facility] Pager: [phone]  If 7PM-7AM, please contact night-coverage [email or web address] Password [facility] 03/19/2017, 12:16 PM

## 2017-03-19 NOTE — Progress Notes (Signed)
Patient had 14 beats of vtach, was asymptomatic per assessment, MD made aware.

## 2017-03-19 NOTE — Progress Notes (Signed)
Arrived to patient room 40M-06 at 0847.  Reviewed treatment plan and this RN agrees.  Report received from bedside RN, Seward Grater.  Consent verified.  Patient Alert, commands at times. Lung sounds clear and diminished to ausculation in all fields. No edema. Cardiac: ST.  Prepped LUAVF with alcohol and cannulated with two 15 gauge needles.  Pulsation of blood noted.  Flushed access well with saline per protocol.  Connected and secured lines and initiated tx at 0900.  UF goal of 2500 mL and net fluid removal of 2000 mL.  Will continue to monitor.

## 2017-03-19 NOTE — Progress Notes (Signed)
Dialysis treatment completed.  2500 mL ultrafiltrated and net fluid removal 2000 mL.    Patient status unchanged. Lung sounds diminished to ausculation in all fields. No edema. Cardiac: ST.  Disconnected lines and removed needles.  Pressure held for 10 minutes and band aid/gauze dressing applied.  Report given to bedside RN, Seward Grater.

## 2017-03-19 NOTE — Progress Notes (Signed)
SLP Cancellation Note  Patient Details Name: [name] MRN: [id number] DOB: 11/12/1980   Cancelled treatment:       Reason Eval/Treat Not Completed: Patient at procedure or test/unavailable   Natia Fahmy, [name] 03/19/2017, 10:02 AM

## 2017-03-19 NOTE — Progress Notes (Signed)
Daily Progress Note   Patient Name: [name]       Date: 03/19/2017 DOB: 10/25/1980  Age: 36 y.o. MRN#: [id number] Attending Physician: Beth Dumas, MD Primary Care Physician: Beth Aly, FNP Admit Date: 02/14/2017  Reason for Consultation/Follow-up: Establishing goals of care  Subjective: [name] is able to follow simple commands.  She appears comfortable.  Mother at bedside states she feels [name] is doing ok.  Plans are still for full scope treatment.   They are hopeful she will be able to eat and not need a PEG tube.   Assessment: Patient appears stable after AKA.  Wound vac in place on left AKA, N/G in for feeds.  Patient with rectal tube for diarrhea.   Patient Profile/HPI: 36 yo female with ESRD on HD, DM1, COPD, CAD, PVD with dry gangrene in her left lower extremitywho was admitted and suffered asystole cardiac arrest. She now has an anoxic brain injury.    Length of Stay: 33  Current Medications: Scheduled Meds:  . calcium carbonate (dosed in mg elemental calcium)  250 mg of elemental calcium Oral TID  . carvedilol  12.5 mg Oral BID WC  . chlorhexidine gluconate (MEDLINE KIT)  15 mL Mouth Rinse BID  . darbepoetin (ARANESP) injection - DIALYSIS  200 mcg Intravenous Q Tue-HD  . feeding supplement (NEPRO CARB STEADY)  1,000 mL Per Tube Q24H  . feeding supplement (PRO-STAT SUGAR FREE 64)  60 mL Per Tube QID  . [name]'s butt cream   Topical QID  . heparin  5,000 Units Subcutaneous Q8H  . insulin aspart  0-15 Units Subcutaneous Q4H  . mouth rinse  15 mL Mouth Rinse QID  . multivitamin  1 tablet Oral QHS  . pantoprazole sodium  40 mg Per Tube Q24H  . potassium & sodium phosphates  1 packet Per Tube TID WC & HS  . sodium chloride flush  10-40 mL Intracatheter Q12H      Continuous Infusions: . sodium chloride 10 mL/hr at 03/16/17 1634  . sodium chloride    . sodium chloride    . sodium chloride    . sodium chloride    . sodium chloride    . sodium chloride      PRN Meds: sodium chloride, sodium chloride, sodium chloride, sodium chloride, sodium chloride, sodium chloride, acetaminophen (TYLENOL)  oral liquid 160 mg/5 mL, acetaminophen **OR** acetaminophen, albuterol, fentaNYL (SUBLIMAZE) injection, heparin, heparin, HYDROmorphone (DILAUDID) injection, lidocaine (PF), lidocaine-prilocaine, metoCLOPramide **OR** metoCLOPramide (REGLAN) injection, metoprolol tartrate, ondansetron **OR** ondansetron (ZOFRAN) IV, pentafluoroprop-tetrafluoroeth, sodium chloride flush  Physical Exam    Well developed female on trach collar.  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 CV tachy Resp no distress Abdomen soft nt, nd        Vital Signs: BP 90/62   Pulse (!) 116   Temp 98.3 F (36.8 C) (Oral)   Resp (!) 41   Ht 5' 8.5" (1.74 m)   Wt 84 kg (185 lb 3 oz)   LMP  (LMP Unknown) Comment: Patient trached  SpO2 96%   BMI 27.75 kg/m  SpO2: SpO2: 96 % O2 Device: O2 Device: Tracheostomy Collar O2 Flow Rate: O2 Flow Rate (L/min): 8 L/min  Intake/output summary:  Intake/Output Summary (Last 24 hours) at 03/19/17 0802 Last data filed at 03/18/17 2355  Gross per 24 hour  Intake              200 ml  Output                0 ml  Net              200 ml   LBM: Last BM Date: 03/18/17 Baseline Weight: Weight: 108 kg (238 lb) Most recent weight: Weight: 84 kg (185 lb 3 oz)       Palliative Assessment/Data:    Flowsheet Rows     Most Recent Value  Intake Tab  Referral Department  Hospitalist  Unit at Time of Referral  ICU  Palliative Care Primary Diagnosis  Nephrology  Date Notified  03/03/17  Palliative Care Type  New Palliative care  Reason for referral  Clarify Goals of Care  Date of Admission  02/14/17  Date first seen by Palliative Care  03/04/17  # of  days Palliative referral response time  1 Day(s)  # of days IP prior to Palliative referral  17  Clinical Assessment  Palliative Performance Scale Score  30%  Psychosocial & Spiritual Assessment  Palliative Care Outcomes  Patient/Family meeting held?  Yes  Who was at the meeting?  mother and sister  Palliative Care Outcomes  Clarified goals of care      Patient Active Problem List   Diagnosis Date Noted  . Hypoxemia   . Abscess of left leg   . Tracheostomy status ([place])   . Anoxic brain injury ([place])   . Palliative care encounter   . Goals of care, counseling/discussion   . Tachycardia   . DNR (do not resuscitate) discussion 03/04/2017  . Palliative care by specialist 03/04/2017  . Protein calorie malnutrition ([place])   . Acute on chronic respiratory failure with hypoxemia ([place])   . Bacteremia   . Encounter for central line placement   . Elevated liver enzymes   . Jaundice   . Wound dehiscence   . Coma ([place])   . Osteomyelitis ([place]) 02/14/2017  . Pressure injury of skin 02/14/2017  . Cardiac arrest, cause unspecified ([place])   . Dehiscence of amputation stump ([place])   . Status post transmetatarsal amputation of left foot ([place]) 10/21/2016  . Status post transmetatarsal amputation of right foot ([place]) 10/08/2016  . 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 Diabetic osteomyelitis ([place])   . 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 Left foot infection 06/20/2016  .  ESRD (end stage renal disease) ([place]) 06/20/2016  . Diabetic foot ulcer ([place]) 06/20/2016  . Chronic pain syndrome 02/24/2016  . Normal coronary arteries 10/12/2015  . NSTEMI- type 2, Troponin 11.2 05/11/2015  . Acute respiratory failure ([place]) 05/10/2015  . History of arthroscopy of right shoulder-05/10/15 05/10/2015  . CKD (chronic kidney disease) stage 5, GFR less than 15 ml/min ([facility]) 09/21/2014  . Unspecified asthma(493.90) 10/25/2013  . Acute combined  systolic and diastolic heart failure ([place]) 09/21/2013  . Non-ischemic cardiomyopathy - by echo 8/16- EF 35-40% 09/16/2013  . Morbid obesity-  09/16/2013  . Uncontrolled type 2 diabetes with renal manifestation ([place]) 09/15/2013  . Normocytic anemia 09/15/2013  . Lower extremity edema 09/15/2013  . Hyperlipidemia   . Hypertension     Palliative Care Plan    Recommendations/Plan:  Diarrhea - tube feed related?   Will d/c reglan (even though I don't think she has received any)  Requested nutrition evaluate tube feeds to see if a different formula may reduce diarrhea  May consider imodium if C-diff is negative.  Goals of Care and Additional Recommendations:  Limitations on Scope of Treatment: Full Scope Treatment  Code Status:  Full code  Prognosis:   Unable to determine   Discharge Planning:  To Be Determined  Care plan was discussed with patient's mother and Dr. Allyson Sabal  Thank you for allowing the Palliative Medicine Team to assist in the care of this patient.  Total time spent:  15 min.     Greater than 50%  of this time was spent counseling and coordinating care related to the above assessment and plan.  Imogene Burn, PA-C Palliative Medicine  Please contact Palliative MedicineTeam phone at [phone] for questions and concerns between 7 am - 7 pm.   Please see AMION for individual provider pager numbers.

## 2017-03-19 NOTE — Progress Notes (Addendum)
Nutrition Consult/Brief Note  RD consulted for question regarding TF formula/diarrhea.  Nepro with CARBSTEADY is the only formula on [employer] formulary designed for pt's on dialysis with low Phos, K and Na.  Also contains fiber and short-term fructooligosaccharides (prebiotic).  Noted Reglan discontinued this AM.  Pt may also benefit from addition of probiotic.  Recommend continuing current TF regimen at this time.  RD to continue to follow.  Maureen Chatters, RD, LDN Pager #: [phone] After-Hours Pager #: [phone]

## 2017-03-19 NOTE — Progress Notes (Signed)
[facility] ROUNDING NOTE   Subjective:    Interval History: 36 y.o.femalewho presents with radiologic evidence of osteomyelitis with wound dehiscence and nonhealing. The patient is having drainage of mucopurulent drainage from the wound. Patient admitted forBKA 6/15 , she is dialysis dependent and has a tracheostomy. Diarrhea and rectal tube . Right foot with protecting boot  Objective:  Vital signs in last 24 hours:  Temp:  [98 F (36.7 C)-99 F (37.2 C)] 98 F (36.7 C) (06/21 0457) Pulse Rate:  [107-120] 116 (06/21 0715) Resp:  [6-38] 6 (06/21 0715) BP: (90-123)/(46-82) 90/62 (06/21 0715) SpO2:  [92 %-100 %] 96 % (06/21 0715) FiO2 (%):  [30 %] 30 % (06/21 0715) Weight:  [185 lb 3 oz (84 kg)] 185 lb 3 oz (84 kg) (06/21 0450)  Weight change: -6 lb 9.8 oz (-3 kg) Filed Weights   03/17/17 1848 03/18/17 0455 03/19/17 0450  Weight: 188 lb 7.9 oz (85.5 kg) 182 lb 15.7 oz (83 kg) 185 lb 3 oz (84 kg)    Intake/Output: I/O last 3 completed shifts: In: 535 [I.V.:10; Other:200; NG/GT:275; IV Piggyback:50] Out: -    Intake/Output this shift:  No intake/output data recorded.  CVS- RRR tachy  RS- CTA  Trach  ABD- BS present soft non-distended   NGT  EXT- no edema  LUA AVF  + bruit    Basic Metabolic Panel:  Recent Labs Lab 03/13/17 0557 03/14/17 0449 03/15/17 0455 03/17/17 0405 03/19/17 0504  NA 136 136 135 136 140  K 4.1 4.4 3.5 3.1* 3.8  CL 96* 97* 95* 97* 99*  CO2 26 23 27 24 24   GLUCOSE 172* 146* 157* 175* 201*  BUN 45* 63* 29* 95* 83*  CREATININE 3.78* 5.26* 3.17* 5.81* 4.71*  CALCIUM 9.0 8.6* 8.3* 8.7* 8.9  MG  --   --   --   --  2.2  PHOS 3.0 5.1* 3.9  --   --     Liver Function Tests:  Recent Labs Lab 03/13/17 0557 03/14/17 0449 03/15/17 0455 03/17/17 0405 03/19/17 0504  AST  --   --   --  21 24  ALT  --   --   --  6* 7*  ALKPHOS  --   --   --  227* 288*  BILITOT  --   --   --  1.5* 1.3*  PROT  --   --   --  6.3* 6.9   ALBUMIN 2.4* 2.4* 2.2* 2.1* 2.3*   No results for input(s): LIPASE, AMYLASE in the last 168 hours. No results for input(s): AMMONIA in the last 168 hours.  CBC:  Recent Labs Lab 03/14/17 0449 03/15/17 0455 03/16/17 0444 03/17/17 0405 03/19/17 0504  WBC 22.8* 24.9* 23.3* 19.4* 18.6*  NEUTROABS 19.6*  --   --   --   --   HGB 7.6* 7.5* 7.6* 7.6* 8.2*  HCT 25.7* 25.5* 25.4* 26.1* 28.5*  MCV 96.6 98.1 95.5 95.3 99.0  PLT 334 359 371 402* 438*    Cardiac Enzymes: No results for input(s): CKTOTAL, CKMB, CKMBINDEX, TROPONINI in the last 168 hours.  BNP: Invalid input(s): POCBNP  CBG:  Recent Labs Lab 03/18/17 1413 03/18/17 1559 03/18/17 2058 03/18/17 2339 03/19/17 0444  GLUCAP 172* 164* 165* 142* 175*    Microbiology: Results for orders placed or performed during the hospital encounter of 02/14/17  Culture, blood (Routine X 2) w Reflex to ID Panel     Status: None   Collection Time:  02/14/17  8:08 AM  Result Value Ref Range Status   Specimen Description BLOOD RIGHT ANTECUBITAL  Final   Special Requests IN PEDIATRIC BOTTLE Blood Culture adequate volume  Final   Culture NO GROWTH 5 DAYS  Final   Report Status 02/19/2017 FINAL  Final  Culture, blood (Routine X 2) w Reflex to ID Panel     Status: None   Collection Time: 02/14/17  8:12 AM  Result Value Ref Range Status   Specimen Description BLOOD RIGHT HAND  Final   Special Requests IN PEDIATRIC BOTTLE Blood Culture adequate volume  Final   Culture NO GROWTH 5 DAYS  Final   Report Status 02/19/2017 FINAL  Final  MRSA PCR Screening     Status: None   Collection Time: 02/14/17  5:44 PM  Result Value Ref Range Status   MRSA by PCR NEGATIVE NEGATIVE Final    Comment:        The GeneXpert MRSA Assay (FDA approved for NASAL specimens only), is one component of a comprehensive MRSA colonization surveillance program. It is not intended to diagnose MRSA infection nor to guide or monitor treatment for MRSA  infections.   C difficile quick scan w PCR reflex     Status: None   Collection Time: 02/27/17  9:41 AM  Result Value Ref Range Status   C Diff antigen NEGATIVE NEGATIVE Final   C Diff toxin NEGATIVE NEGATIVE Final   C Diff interpretation No C. difficile detected.  Final  Culture, blood (Routine X 2) w Reflex to ID Panel     Status: None   Collection Time: 02/27/17 10:39 AM  Result Value Ref Range Status   Specimen Description BLOOD RIGHT HAND  Final   Special Requests IN PEDIATRIC BOTTLE Blood Culture adequate volume  Final   Culture NO GROWTH 5 DAYS  Final   Report Status 03/04/2017 FINAL  Final  Culture, blood (Routine X 2) w Reflex to ID Panel     Status: None   Collection Time: 02/27/17 10:49 AM  Result Value Ref Range Status   Specimen Description BLOOD RIGHT HAND  Final   Special Requests IN PEDIATRIC BOTTLE Blood Culture adequate volume  Final   Culture NO GROWTH 5 DAYS  Final   Report Status 03/04/2017 FINAL  Final  C difficile quick scan w PCR reflex     Status: None   Collection Time: 03/04/17  2:17 PM  Result Value Ref Range Status   C Diff antigen NEGATIVE NEGATIVE Final   C Diff toxin NEGATIVE NEGATIVE Final   C Diff interpretation No C. difficile detected.  Final  Culture, blood (routine x 2)     Status: None   Collection Time: 03/11/17  8:42 AM  Result Value Ref Range Status   Specimen Description BLOOD RIGHT HAND  Final   Special Requests   Final    BOTTLES DRAWN AEROBIC ONLY Blood Culture adequate volume   Culture NO GROWTH 5 DAYS  Final   Report Status 03/16/2017 FINAL  Final  Culture, blood (routine x 2)     Status: None   Collection Time: 03/11/17  8:46 AM  Result Value Ref Range Status   Specimen Description BLOOD RIGHT HAND  Final   Special Requests   Final    BOTTLES DRAWN AEROBIC AND ANAEROBIC Blood Culture adequate volume   Culture NO GROWTH 5 DAYS  Final   Report Status 03/16/2017 FINAL  Final    Coagulation Studies: No results for input(s):  LABPROT, INR in the last 72 hours.  Urinalysis: No results for input(s): COLORURINE, LABSPEC, PHURINE, GLUCOSEU, HGBUR, BILIRUBINUR, KETONESUR, PROTEINUR, UROBILINOGEN, NITRITE, LEUKOCYTESUR in the last 72 hours.  Invalid input(s): APPERANCEUR    Imaging: Dg Chest Port 1 View  Result Date: 03/17/2017 CLINICAL DATA:  Shortness of Breath EXAM: PORTABLE CHEST 1 VIEW COMPARISON:  03/11/2017 FINDINGS: Cardiac shadow is enlarged but stable. Tracheostomy tube, feeding catheter and right-sided jugular central line are noted. The lungs are poorly aerated but no focal infiltrate is seen. Mild crowding of the vascular markings is noted related to the poor inspiratory effort. No bony abnormality is seen. IMPRESSION: Stable cardiomegaly. Poor inspiratory effort although no focal infiltrate is seen. Electronically Signed   By: Inez Catalina M.D.   On: 03/17/2017 13:48     Medications:   . sodium chloride 10 mL/hr at 03/16/17 1634  . sodium chloride    . sodium chloride    . sodium chloride    . sodium chloride    . sodium chloride    . sodium chloride     . calcium carbonate (dosed in mg elemental calcium)  250 mg of elemental calcium Oral TID  . carvedilol  12.5 mg Oral BID WC  . chlorhexidine gluconate (MEDLINE KIT)  15 mL Mouth Rinse BID  . darbepoetin (ARANESP) injection - DIALYSIS  200 mcg Intravenous Q Tue-HD  . feeding supplement (NEPRO CARB STEADY)  1,000 mL Per Tube Q24H  . feeding supplement (PRO-STAT SUGAR FREE 64)  60 mL Per Tube QID  . [name]'s butt cream   Topical QID  . heparin  5,000 Units Subcutaneous Q8H  . insulin aspart  0-15 Units Subcutaneous Q4H  . mouth rinse  15 mL Mouth Rinse QID  . multivitamin  1 tablet Oral QHS  . pantoprazole sodium  40 mg Per Tube Q24H  . potassium & sodium phosphates  1 packet Per Tube TID WC & HS  . sodium chloride flush  10-40 mL Intracatheter Q12H   sodium chloride, sodium chloride, sodium chloride, sodium chloride, sodium chloride,  sodium chloride, acetaminophen (TYLENOL) oral liquid 160 mg/5 mL, acetaminophen **OR** acetaminophen, albuterol, fentaNYL (SUBLIMAZE) injection, heparin, heparin, HYDROmorphone (DILAUDID) injection, lidocaine (PF), lidocaine-prilocaine, metoCLOPramide **OR** metoCLOPramide (REGLAN) injection, metoprolol tartrate, ondansetron **OR** ondansetron (ZOFRAN) IV, pentafluoroprop-tetrafluoroeth, sodium chloride flush  Assessment/ Plan:  1. 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 VDRF: trach- dependent, off vent now 3. 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 ESRD - HD TTS, 4h 31mn 5. Anemia - HGB 8.2. Cont ESA ( 200 mcg weekly ) . No Fe D/T osteo.  6. Secondary hyperparathyroidism -  Calcium 8.9  7. HTN/volume -  Pressures appear marginal , cont coreg at 12.5 bid   8. Nutrition: Albumin 2.1 On tube feeding prostat/nepro  9. DM: per primary 10. NICM H/O diastolic/systolic HF  EF 35 %   Plan dialysis today   LOS: 33 Alejandro Gamel W @TODAY @7 :33 AM

## 2017-03-20 LAB — GLUCOSE, CAPILLARY
GLUCOSE-CAPILLARY: 127 mg/dL — AB (ref 65–99)
GLUCOSE-CAPILLARY: 143 mg/dL — AB (ref 65–99)
GLUCOSE-CAPILLARY: 140 mg/dL — AB (ref 65–99)
GLUCOSE-CAPILLARY: 182 mg/dL — AB (ref 65–99)
Glucose-Capillary: 168 mg/dL — ABNORMAL HIGH (ref 65–99)

## 2017-03-20 MED ORDER — WHITE PETROLATUM GEL
Status: AC
  Filled 2017-03-20: qty 1

## 2017-03-20 NOTE — Progress Notes (Signed)
PULMONARY / CRITICAL CARE MEDICINE   Name: [name] MRN: [id number] DOB: 01-12-1981    ADMISSION DATE:  02/14/2017 CONSULTATION DATE:  02/14/2017  REFERRING MD:  Feliz Beam MD  CHIEF COMPLAINT:  Sepsis, cardiac arrest  BRIEF SUMMARY:   36 year old female w/ ESRD d/t poorly controlled DM, bilateral transmetatarsal foot amputations, obesity, HTN, medical non-compliance as well as NICM. Admitted 5/19 with complaints of left leg pain, found to have radiological evidence of osteomyelitis with wound nonhealing, dehiscence, purulent drainage from the wound. Admitted for IV antibiotics, hemodialysis and possible surgery.  Found unresponsive in the room on 5/19. She had chest compressions, epi bicarb given. She was intubated by anesthesia and had ROSC in approximately 9 minutes. Downtime prior to code is unknown. She got dilaudid in the afternoon for leg pain.  SUBJECTIVE:  No events overnight, tolerating TC  VITAL SIGNS: BP 108/74   Pulse (!) 121   Temp 98.8 F (37.1 C) (Axillary)   Resp (!) 22   Ht 5' 8.5" (1.74 m)   Wt 174 lb 2.6 oz (79 kg)   LMP  (LMP Unknown) Comment: Patient trached  SpO2 95%   BMI 26.10 kg/m   VENTILATOR SETTINGS: FiO2 (%):  [28 %-30 %] 28 %  INTAKE / OUTPUT: I/O last 3 completed shifts: In: 1439.3 [Other:800; NG/GT:639.3] Out: 2600 [Other:2000; Stool:600]  General:  Frail, ill appearing female, older than stated age HEENT: Trach in good position PSY: Unable to assess due to trach Neuro: Not following command CV: RRR, Nl S1/S2, -M/R/G. PULM: Coarse BS diffusely GI: Soft, NT, ND and +BS Extremities: warm/dry, -edema, wounds and amputation unchanged Skin: no rashes or lesions  LABS:  BMET  Recent Labs Lab 03/15/17 0455 03/17/17 0405 03/19/17 0504  NA 135 136 140  K 3.5 3.1* 3.8  CL 95* 97* 99*  CO2 27 24 24   BUN 29* 95* 83*  CREATININE 3.17* 5.81* 4.71*  GLUCOSE 157* 175* 201*   Electrolytes  Recent Labs Lab 03/14/17 0449  03/15/17 0455 03/17/17 0405 03/19/17 0504  CALCIUM 8.6* 8.3* 8.7* 8.9  MG  --   --   --  2.2  PHOS 5.1* 3.9  --   --    CBC  Recent Labs Lab 03/16/17 0444 03/17/17 0405 03/19/17 0504  WBC 23.3* 19.4* 18.6*  HGB 7.6* 7.6* 8.2*  HCT 25.4* 26.1* 28.5*  PLT 371 402* 438*   No results for input(s): PHART, PCO2ART, PO2ART in the last 168 hours.  Liver Enzymes  Recent Labs Lab 03/15/17 0455 03/17/17 0405 03/19/17 0504  AST  --  21 24  ALT  --  6* 7*  ALKPHOS  --  227* 288*  BILITOT  --  1.5* 1.3*  ALBUMIN 2.2* 2.1* 2.3*   Glucose  Recent Labs Lab 03/19/17 1542 03/19/17 2014 03/19/17 2303 03/20/17 0406 03/20/17 0754 03/20/17 1143  GLUCAP 176* 124* 137* 168* 127* 143*   STUDIES:  Lower extremity ultrasound 5/18 >> no evidence of DVT CT head 5/19 >> normal exam Echo 5/20 >> EF 30-35%, increased LVF. Diffuse hypokinesis, akinesis of basilar mid-inferior myocardium, grade 2 diastolic dysfxn  EEG 5/20 >> finding c/w mod to severe global cerebral dysfxn. C/w anoxic injury given clinical course  LE [place] 5/21 >> negative MRI brain 5/24 >> ?thrombosed cortical vein, otherwise normal TEE 6/4 >> mild MR, normal AV, mild TR, mild PR, EF 30-35%, diffuse hypokinesis, no thrombus, no PFO, normal RV, moderate pericardial effusion with synechia suggesting some chronicity, no  vegetations   CULTURES: Bcx 5/19 X 2 >> negative Blood cultures 6/1 >> negative c diff 6/1 >> negative  ANTIBIOTICS: Clinidamycin 5/19 >> 5/21 Vancomycin 5/19 >> 5/25 >> restart 5/30 Zosyn 5/19 >> 5/22 Meropenem 5/22 >> 5/30 Flagyl 5/30 >>off Ceftriaxone 5/30 >>off  SIGNIFICANT EVENTS: 5/19  Admit, cardiac arrest, transfer to ICU; hypothermia protocol started 5/24  Off pressors.  MRI w/out evidence of ischemia  6/03  Tolerated ATC couple hours, then back to vent, HD   LINES/TUBES: ETT 5/19>>>5/31 Lt IJ CVL 5/19>>>out Trach 5/31>>>  ASSESSMENT / PLAN:  Discussion:  36 y/o F with PMH of poorly  controlled DM, ESRD on HD and known gangrene of L achilles heel, admitted with non-healing wounds.  Admitted per TRH.  Received dilaudid for pain and subsequently suffered a cardiac arrest.  Pulmonary: Acute respiratory failure post cardiac arrest  P: Maintain on TC as tolerated Maintain current trach type and size, sutures removed. Trach care per protocol PT as able Intermittent CXR  PCCM will f/u on Monday.  Alyson Reedy, M.D. [facility] Pulmonary/Critical Care Medicine. Pager: [phone]. After hours pager: [phone].

## 2017-03-20 NOTE — Evaluation (Signed)
Clinical/Bedside Swallow Evaluation Patient Details  Name: [name] MRN: [id number] Date of Birth: March 30, 1981  Today's Date: 03/20/2017 Time: SLP Start Time (ACUTE ONLY): 1340 SLP Stop Time (ACUTE ONLY): 1400 SLP Time Calculation (min) (ACUTE ONLY): 20 min  Past Medical History:  Past Medical History:  Diagnosis Date  . Anemia   . Arthritis    "left hand" (09/15/2013)  . Asthma   . CHF (congestive heart failure) (HCC)   . Chronic bronchitis (HCC)    "just about q yr" (09/15/2013)  . Chronic kidney disease    "low kidney function" (09/15/2013) ,  T/Th/Sa  . COPD (chronic obstructive pulmonary disease) (HCC)   . Coronary artery disease   . Hyperlipidemia   . Hypertension   . Migraine    "get them alot" (09/15/2013)  . Myocardial infarction [facility]) 04/2015   NSTEMI  . Normal coronary arteries    by cardiac catheterization 09/20/13  . Peripheral vascular disease (HCC)   . Pneumonia    "couple times; have it now" (09/15/2013)  . PONV (postoperative nausea and vomiting)   . Restless legs   . Shortness of breath    "just recently; related to the pneumonia" (09/15/2013)  . Type 1 diabetes mellitus (HCC)    type 2   Past Surgical History:  Past Surgical History:  Procedure Laterality Date  . AMPUTATION Right 06/25/2016   Procedure: Amputation Right Great Toe at the Metatarsophalangeal Joint;  Surgeon: Nadara Mustard, MD;  Location[facility] OR;  Service: Orthopedics;  Laterality: Right;  . AMPUTATION Bilateral 10/08/2016   Procedure: Bilateral Transmetatarsal Amputation;  Surgeon: Nadara Mustard, MD;  Location: [facility] OR;  Service: Orthopedics;  Laterality: Bilateral;  . AMPUTATION Left 03/13/2017   Procedure: AMPUTATION ABOVE KNEE;  Surgeon: Nadara Mustard, MD;  Location: [facility] OR;  Service: Orthopedics;  Laterality: Left;  . AV FISTULA PLACEMENT Left 03/27/2015   Procedure: CREATION RADIAL CEPHALIC ARTERIOVENOUS FISTULA;  Surgeon: Chuck Hint, MD;  Location: [facility] OR;  Service: Vascular;   Laterality: Left;  . AV FISTULA PLACEMENT Left 11/23/2015   Procedure:  LEFT ARM BASILIC VEIN TRANSPOSITION  ;  Surgeon: Chuck Hint, MD;  Location: [facility] OR;  Service: Vascular;  Laterality: Left;  . CESAREAN SECTION  1999; 2006  . CORONARY ANGIOGRAM  09/20/2013   Procedure: CORONARY ANGIOGRAM;  Surgeon: Runell Gess, MD;  Location[facility] C[facility] LAB;  Service: Cardiovascular;;  . FINGER SURGERY Left 1985   3rd and 4th digits reconstructed after cut off" (09/15/2013)  . IR FLUORO GUIDE CV LINE RIGHT  03/11/2017  . IR US GUIDE VASC ACCESS RIGHT  03/11/2017  . PERIPHERAL VASCULAR CATHETERIZATION N/A 09/05/2016   Procedure: Abdominal Aortogram;  Surgeon: Sherren Kerns, MD;  Location: [facility] INVASIVE CV LAB;  Service: Cardiovascular;  Laterality: N/A;  . PERIPHERAL VASCULAR CATHETERIZATION Bilateral 09/05/2016   Procedure: Lower Extremity Angiography;  Surgeon: Sherren Kerns, MD;  Location: [facility] INVASIVE CV LAB;  Service: Cardiovascular;  Laterality: Bilateral;  . PERIPHERAL VASCULAR CATHETERIZATION Right 09/05/2016   Procedure: Peripheral Vascular Balloon Angioplasty;  Surgeon: Sherren Kerns, MD;  Location: [facility] INVASIVE CV LAB;  Service: Cardiovascular;  Laterality: Right;  peroneal and AT  . SHOULDER ARTHROSCOPY WITH BICEPSTENOTOMY Right 05/10/2015   Procedure: RIGHT SHOULDER ARTHROSCOPY WITH BICEPS TENOTOMY, DEBRIDEMENT LABRAL TEAR;  Surgeon: Jones Broom, MD;  Location: [facility] OR;  Service: Orthopedics;  Laterality: Right;  Right shoulder arthroscopy biceps tenotomy, debridement labral tear  . STUMP REVISION Left 01/21/2017  Procedure: . Revision Left Transmetatarsal Amputation;  Surgeon: Nadara Mustard, MD;  Location: [facility] OR;  Service: Orthopedics;  Laterality: Left;  . TONSILLECTOMY  1997  . TUBAL LIGATION  2006   HPI:  [name] a 36 y.o.femalewith a history of ESRD on HD T TH S, COPD/ Asthma , D s/p MI 04/2015, HLD, HTN, CHF, chronic anemia, DM, and a history of L foot partial  amputation 09/2016 with revision in 12/2016 for dehiscence, presenting to the ED with worsening LLE pain, swelling, increased drainage at the stump, and chills. ETT 5/19 and trach'd 5/31. Hospital course included cardiac arrest, transfer to ICU   Assessment / Plan / Recommendation Clinical Impression  Pt is unable to tolerate PMSV due to size of trach, but Cortrak has come out and pt potentially could toelrate PO despite inability to use PMSV. SLP repositioned pt and provided ice chip trials with excellent presentation. Pt masticated and swallowed without overt signs of aspiration. Will proceed with MBS for objective assessment of swallowing for potential diet.  SLP Visit Diagnosis: Dysphagia, unspecified (R13.10)    Aspiration Risk  Severe aspiration risk    Diet Recommendation NPO   Medication Administration: Crushed with puree    Other  Recommendations Oral Care Recommendations: Oral care QID   Follow up Recommendations Skilled Nursing facility      Frequency and Duration            Prognosis        Swallow Study   General HPI: [name] a 37 y.o.femalewith a history of ESRD on HD T TH S, COPD/ Asthma , D s/p MI 04/2015, HLD, HTN, CHF, chronic anemia, DM, and a history of L foot partial amputation 09/2016 with revision in 12/2016 for dehiscence, presenting to the ED with worsening LLE pain, swelling, increased drainage at the stump, and chills. ETT 5/19 and trach'd 5/31. Hospital course included cardiac arrest, transfer to ICU Type of Study: Bedside Swallow Evaluation Previous Swallow Assessment: none Diet Prior to this Study: NPO Temperature Spikes Noted: No Respiratory Status: Trach Collar History of Recent Intubation: No Behavior/Cognition: Alert;Requires cueing Oral Care Completed by SLP: No Oral Cavity - Dentition: Adequate natural dentition Self-Feeding Abilities: Total assist Patient Positioning: Upright in bed Baseline Vocal Quality: Not observed Volitional  Cough: Strong Volitional Swallow: Able to elicit    Oral/Motor/Sensory Function Overall Oral Motor/Sensory Function: Within functional limits   Ice Chips Ice chips: Within functional limits Presentation: Spoon   Thin Liquid Thin Liquid: Not tested    Nectar Thick     Honey Thick     Puree     Solid   GO           Harlon Ditty, MA CCC-SLP [phone]  Stuti Sandin, [name] 03/20/2017,2:28 PM

## 2017-03-20 NOTE — Progress Notes (Signed)
PROGRESS NOTE        PATIENT DETAILS Name: [name] Age: 36 y.o. Sex: female Date of Birth: 07-06-1981 Admit Date: 02/14/2017 Admitting Physician Lady Deutscher, MD [id number], Helene Kelp, FNP  Note-care assumed by me on 6/18  Brief Narrative: Patient is a 36 y.o. female with history of ESRD on HD, poorly controlled diabetes, history of bilateral transmetatarsal foot amputation with subsequent left foot wound dehiscence (refused BKA as outpatient), chronic systolic heart failure felt to be secondary to nonischemic cardiomyopathy admitted on 02/14/17 with wound dehiscence and persistent drainage from the left trans-metatarsal amputation site, she was admitted to the [facility]. She was subsequently found to be unresponsive in her room on 5/19 after getting IV Dilaudid-she was  in asystole, CPR was started, with ROSC in around 9 minutes.She was then transferred to the intensive care unit, and subsequently underwent cooling and then rewarming. She was unable to be liberated off the ventilator, and as a result underwent tracheostomy on 5/31. Patient has been evaluated by neurology during this hospital stay, per neurology chances of meaningful neurological recovery is dismal. Hospital course has now been complicated by persistent leukocytosis, respiratory failure, and wound dehiscence with gangrene causing persistent SIRS pathophysiology. 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 On ATC overnight .  PCCM following for resp issues -tracheostomy in place. Trach replaced for same size #6. Critical care evaluated patient 6/22  recommend maintaining current Trach  Size.  she may be ready for Swallow study  and PMV training, holding  off on PEG for now. I have made multiple requests and put in orders to expedite speech therapy to see her . Patient pulled out her [place]  tube yesterday, we're making attempts to replace it    Diarrhea reglab dc'd probiotic  DC rectal tube soon   Cardiac arrest on 5/19 with anoxic brain injury:  She seems to be slowly improving, following all major commands. Evaluated by neurology on 5/29-felt to have poor overall prognoses for any meaningful recovery.     Systemic inflammatory response syndrome:/Persistent leukocytosis Continues to be intermittently febrile (low-grade), tachycardic-and has persistent leukocytosis white count slightly improved from 23.3-> 18.6  which is slowly improving. Likely secondary to left foot gangrene/infection and abscess-she is now status post AKA and still continues to have these symptoms. Blood cultures on 6/1, 6/13 continue to be negative. . Chest x-ray on 6/13 negative for pneumonia. C. difficile studies on 6/6 negative,   repeat c diff pending , check due to prolonged course of antibiotics .Still has a rectal tube in place. Marland Kitchen She was on vancomycin and Tressie Ellis that was started on 6/14, and subsequently discontinued on 6/16 after AKA. Spoke with Dr Linus Salmons- who has evaluated this patient in the past-he recommends  vancomycin and Fortaz through 6/20, now completed .      Left trans-metatarsal stump wound dehiscence with infection and gangrene: Per operative note-pus/abscess noted extending up to the knee when BKA was attempted, subsequently underwent a AKA. Note, initially family was very reluctant to proceed with amputation, but given persistent leukocytosis/fever and potential risk of sepsis, family agreed for amputation, subsequently AKA was done on  6/15.  She  has a wound VAC in place which will be continued for 1 week postoperatively until 6/22. Pain seems to be under control  ESRD: Nephrology following-HD on TTS. Last hemodialysis  6/21  Anemia: Hemoglobin 7.5-9.0  Likely secondary to chronic disease-probably worsened by acute illness. No signs of bleeding, hemoglobin has been stable around 8.2  DM-2: CBGs stable-continue with SSI.   Chronic systolic heart failure/nonischemic cardiomyopathy (EF 30-35% by TEE on 6/4): Volume status remained stable-this is managed with dialysis-continue with Coreg.   Pericardial effusion: Seen on TEE-with no evidence of tamponade pathophysiology. Repeat echo at some point in the next few weeks.  Moderate protein calorie malnutrition: Continue NG tube feedings  Hypokalemia-stable  Goals of care: Unfortunate 36 year old female with ESRD, cardiomyopathy, bilateral transmetatarsal amputations complicated by left stump dehiscence-suffered a cardiac arrest on admission-now with anoxic brain injury-ventilator dependent-tracheostomy in place, awaiting  initiation of oral feeding. Hospital course has been prolonged-complicated by failure to wean from the ventilator, persistent systemic inflammatory response syndrome due to left foot gangrene/infection-after extensive discussion with family-she remains a full code, with family choosing to continue with full aggressive care. 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 Her prognosis is still very guarded at this time, will await attempts to see if she can be liberated off the ventilator. Family is aware of the risk of decompensation and deterioration.     Telemetry (independently reviewed): Sinus tachycardia  Studies: Lower extremity ultrasound 5/18 >> no evidence of DVT CT head 5/19 >> normal exam Echo 5/20 >> EF 30-35%, increased LVF. Diffuse hypokinesis, akinesis of basilar mid-inferior myocardium, grade 2 diastolic dysfxn  EEG 8/54 >> finding c/w mod to severe global cerebral dysfxn. C/w anoxic injury given clinical course  LE [place] 5/21 >> negative MRI brain 5/24 >> ?thrombosed cortical vein, otherwise normal TEE 6/4 >> mild  MR, normal AV, mild TR, mild PR, EF 30-35%, diffuse hypokinesis, no thrombus, no PFO, normal RV, moderate pericardial effusion with synechia suggesting some chronicity, no vegetations   Echo (reviewed):EF 30-35% on TEE done on 6/4  Morning labs/Imaging ordered: yes  DVT Prophylaxis: Prophylactic Heparin   Code Status: Full code   Family Communication: Mother at bedside, discussed on a daily basis  Disposition Plan: Remain inpatient in SDU  Antimicrobial agents: Anti-infectives    Start     Dose/Rate Route Frequency Ordered Stop   03/17/17 1800  cefTAZidime (FORTAZ) 2 g in dextrose 5 % 50 mL IVPB  Status:  Discontinued     2 g 100 mL/hr over 30 Minutes Intravenous Every T-Th-Sa (1800) 03/15/17 1143 03/18/17 1223   03/17/17 1200  vancomycin (VANCOCIN) IVPB 1000 mg/200 mL premix  Status:  Discontinued     1,000 mg 200 mL/hr over 60 Minutes Intravenous Every T-Th-Sa (Hemodialysis) 03/15/17 1143 03/18/17 1223   03/14/17 1200  vancomycin (VANCOCIN) IVPB 1000 mg/200 mL premix  Status:  Discontinued     1,000 mg 200 mL/hr over 60 Minutes Intravenous Every T-Th-Sa (Hemodialysis) 03/12/17 0912 03/14/17 1542   03/13/17 1430  ceFAZolin (ANCEF) IVPB 2g/100 mL premix  Status:  Discontinued     2 g 200 mL/hr over 30 Minutes Intravenous To ShortStay Surgical 03/12/17 1208 03/13/17 1113   03/13/17 1115  ceFAZolin (ANCEF) IVPB 2g/100 mL premix     2 g 200 mL/hr over 30 Minutes Intravenous To Surgery 03/13/17 1108 03/13/17 1506   03/12/17 1800  cefTAZidime (FORTAZ) 2 g in dextrose 5 % 50 mL IVPB  Status:  Discontinued     2 g 100 mL/hr over 30 Minutes Intravenous Every T-Th-Sa (1800) 03/12/17 0912 03/14/17 1542   03/12/17 1200  vancomycin (VANCOCIN) 1,500 mg in sodium chloride 0.9 % 250 mL IVPB     1,500 mg 250 mL/hr over 60 Minutes Intravenous Every Thu (Hemodialysis) 03/12/17 0912 03/12/17 1356   03/02/17 1200  cefTRIAXone (ROCEPHIN) 2 g in dextrose 5 % 50 mL IVPB     2 g 100 mL/hr over 30  Minutes Intravenous Every 24 hours 03/02/17 0810 03/06/17 1358   03/02/17 1200  vancomycin (VANCOCIN) IVPB 1000 mg/200 mL premix     1,000 mg 200 mL/hr over 60 Minutes Intravenous Every M-W-F (Hemodialysis) 03/02/17 0815 03/02/17 1457   03/02/17 1200  metroNIDAZOLE (FLAGYL) IVPB 500 mg     500 mg 100 mL/hr over 60 Minutes Intravenous Every 8 hours 03/02/17 0948 03/06/17 2230   02/26/17 1200  vancomycin (VANCOCIN) IVPB 1000 mg/200 mL premix     1,000 mg 200 mL/hr over 60 Minutes Intravenous Every T-Th-Sa (Hemodialysis) 02/25/17 1151 03/05/17 1325   02/25/17 1200  vancomycin (VANCOCIN) 2,000 mg in sodium chloride 0.9 % 500 mL IVPB     2,000 mg 250 mL/hr over 120 Minutes Intravenous  Once 02/25/17 1149 02/25/17 1449   02/25/17 0900  cefTRIAXone (ROCEPHIN) 2 g in dextrose 5 % 50 mL IVPB  Status:  Discontinued     2 g 100 mL/hr over 30 Minutes Intravenous Every 24 hours 02/25/17 0814 03/02/17 0810   02/25/17 0900  metroNIDAZOLE (FLAGYL) IVPB 500 mg  Status:  Discontinued     500 mg 100 mL/hr over 60 Minutes Intravenous Every 8 hours 02/25/17 0814 03/02/17 0948   02/20/17 1200  vancomycin (VANCOCIN) IVPB 1000 mg/200 mL premix  Status:  Discontinued     1,000 mg 200 mL/hr over 60 Minutes Intravenous Every M-W-F (Hemodialysis) 02/19/17 1506 02/20/17 1403   02/17/17 1800  meropenem (MERREM) 500 mg in sodium chloride 0.9 % 50 mL IVPB  Status:  Discontinued     500 mg 100 mL/hr over 30 Minutes Intravenous Daily-1800 02/17/17 1026 02/25/17 0814   02/17/17 1200  vancomycin (VANCOCIN) IVPB 1000 mg/200 mL premix     1,000 mg 200 mL/hr over 60 Minutes Intravenous Every T-Th-Sa (Hemodialysis) 02/14/17 1758 02/19/17 1612   02/14/17 2200  clindamycin (CLEOCIN) IVPB 900 mg  Status:  Discontinued     900 mg 100 mL/hr over 30 Minutes Intravenous Every 8 hours 02/14/17 1106 02/14/17 1928   02/14/17 2000  clindamycin (CLEOCIN) IVPB 900 mg  Status:  Discontinued     900 mg 100 mL/hr over 30 Minutes  Intravenous Every 8 hours 02/14/17 1928 02/16/17 1047   02/14/17 1915  piperacillin-tazobactam (ZOSYN) IVPB 3.375 g  Status:  Discontinued     3.375 g 100 mL/hr over 30 Minutes Intravenous Every 12 hours 02/14/17 1801 02/17/17 1026   02/14/17 1845  vancomycin (VANCOCIN) 2,000 mg in sodium chloride 0.9 % 500 mL IVPB     2,000 mg 250 mL/hr over 120 Minutes Intravenous  Once 02/14/17 1758 02/14/17 2107   02/14/17 1115  clindamycin (CLEOCIN) IVPB 900 mg  Status:  Discontinued     900 mg 100 mL/hr over 30 Minutes Intravenous  Once 02/14/17 1106 02/14/17 2236   02/14/17 0945  clindamycin (CLEOCIN) IVPB 600 mg     600 mg 100 mL/hr over 30 Minutes Intravenous  Once 02/14/17 0931 02/14/17 1013      Procedures: ETT 5/19 >>  5/31 Lt IJ CVL 5/19 >> out Trach 5/31 >>  TEE 6/4 >> mild MR, normal AV, mild TR, mild PR, EF 30-35%, diffuse hypokinesis, no thrombus, no PFO, normal RV, moderate pericardial effusion with synechia suggesting some chronicity, no vegetations  Rt IJ TLC 6/13>> Left AKA 6/15>>  CONSULTS:  Infectious disease:  Neurology  Gastroenterology  Wound care  PCCM  Nephrology  Orthopedics  Time spent: 30 minutes-Greater than 50% of this time was spent in counseling, explanation of diagnosis, planning of further management, and coordination of care.  MEDICATIONS: Scheduled Meds: . white petrolatum      . calcium carbonate (dosed in mg elemental calcium)  250 mg of elemental calcium Oral TID  . carvedilol  12.5 mg Oral BID WC  . chlorhexidine gluconate (MEDLINE KIT)  15 mL Mouth Rinse BID  . darbepoetin (ARANESP) injection - DIALYSIS  200 mcg Intravenous Q Tue-HD  . feeding supplement (NEPRO CARB STEADY)  1,000 mL Per Tube Q24H  . feeding supplement (PRO-STAT SUGAR FREE 64)  60 mL Per Tube QID  . [name]'s butt cream   Topical QID  . heparin  5,000 Units Subcutaneous Q8H  . insulin aspart  0-15 Units Subcutaneous Q4H  . mouth rinse  15 mL Mouth Rinse QID  .  multivitamin  1 tablet Oral QHS  . pantoprazole sodium  40 mg Per Tube Q24H  . potassium & sodium phosphates  1 packet Per Tube TID WC & HS  . saccharomyces boulardii  250 mg Oral BID  . sodium chloride flush  10-40 mL Intracatheter Q12H   Continuous Infusions: . sodium chloride 10 mL/hr at 03/16/17 1634  . sodium chloride    . sodium chloride    . sodium chloride    . sodium chloride    . sodium chloride    . sodium chloride     PRN Meds:.sodium chloride, sodium chloride, sodium chloride, sodium chloride, sodium chloride, sodium chloride, acetaminophen (TYLENOL) oral liquid 160 mg/5 mL, acetaminophen **OR** acetaminophen, albuterol, alteplase, fentaNYL (SUBLIMAZE) injection, heparin, heparin, HYDROmorphone (DILAUDID) injection, lidocaine (PF), lidocaine-prilocaine, metoprolol tartrate, ondansetron **OR** ondansetron (ZOFRAN) IV, pentafluoroprop-tetrafluoroeth, sodium chloride flush   PHYSICAL EXAM: Vital signs: Vitals:   03/20/17 0731 03/20/17 1000 03/20/17 1057 03/20/17 1200  BP: 108/74 95/67  102/85  Pulse:  (!) 111 (!) 121 (!) 124  Resp:  (!) 9 (!) 22 (!) 24  Temp: 98.4 F (36.9 C)   98.6 F (37 C)  TempSrc: Oral   Oral  SpO2:  97% 95% 94%  Weight:      Height:       Filed Weights   03/19/17 0847 03/19/17 1300 03/20/17 0644  Weight: 84 kg (185 lb 3 oz) 82 kg (180 lb 12.4 oz) 79 kg (174 lb 2.6 oz)   Body mass index is 26.1 kg/m.  General appearance:Sleeping when I walked in-easily arouses, squeeze my hands. Eyes: No scleral icterus HEENT: Atraumatic and Normocephalic Neck: supple, tracheostomy in place Resp:Good air entry bilaterally, some transmitted upper airway sounds CVS: S1 S2 regular, tachycardic GI: Bowel sounds present, Non tender and not distended with no gaurding, rigidity or rebound. Extremities: Left AKA stump-vac in place. Musculoskeletal:No digital cyanosis  I have personally reviewed following labs and imaging studies  LABORATORY  DATA: CBC:  Recent Labs Lab 03/14/17 0449 03/15/17 0455 03/16/17 0444 03/17/17 0405 03/19/17 0504  WBC 22.8* 24.9* 23.3* 19.4* 18.6*  NEUTROABS 19.6*  --   --   --   --   HGB  7.6* 7.5* 7.6* 7.6* 8.2*  HCT 25.7* 25.5* 25.4* 26.1* 28.5*  MCV 96.6 98.1 95.5 95.3 99.0  PLT 334 359 371 402* 438*    Basic Metabolic Panel:  Recent Labs Lab 03/14/17 0449 03/15/17 0455 03/17/17 0405 03/19/17 0504  NA 136 135 136 140  K 4.4 3.5 3.1* 3.8  CL 97* 95* 97* 99*  CO2 _0 GLUCOSE 146* 157* 175* 201*  BUN 63* 29* 95* 83*  CREATININE 5.26* 3.17* 5.81* 4.71*  CALCIUM 8.6* 8.3* 8.7* 8.9  MG  --   --   --  2.2  PHOS 5.1* 3.9  --   --     GFR: Estimated Creatinine Clearance: 18.3 mL/min (A) (by C-G formula based on SCr of 4.71 mg/dL (H)).  Liver Function Tests:  Recent Labs Lab 03/14/17 0449 03/15/17 0455 03/17/17 0405 03/19/17 0504  AST  --   --  21 24  ALT  --   --  6* 7*  ALKPHOS  --   --  227* 288*  BILITOT  --   --  1.5* 1.3*  PROT  --   --  6.3* 6.9  ALBUMIN 2.4* 2.2* 2.1* 2.3*   No results for input(s): LIPASE, AMYLASE in the last 168 hours. No results for input(s): AMMONIA in the last 168 hours.  Coagulation Profile: No results for input(s): INR, PROTIME in the last 168 hours.  Cardiac Enzymes: No results for input(s): CKTOTAL, CKMB, CKMBINDEX, TROPONINI in the last 168 hours.  BNP (last 3 results) No results for input(s): PROBNP in the last 8760 hours.  HbA1C: No results for input(s): HGBA1C in the last 72 hours.  CBG:  Recent Labs Lab 03/19/17 2014 03/19/17 2303 03/20/17 0406 03/20/17 0754 03/20/17 1143  GLUCAP 124* 137* 168* 127* 143*    Lipid Profile: No results for input(s): CHOL, HDL, LDLCALC, TRIG, CHOLHDL, LDLDIRECT in the last 72 hours.  Thyroid Function Tests: No results for input(s): TSH, T4TOTAL, FREET4, T3FREE, THYROIDAB in the last 72 hours.  Anemia Panel: No results for input(s): VITAMINB12, FOLATE, FERRITIN, TIBC,  IRON, RETICCTPCT in the last 72 hours.  Urine analysis:    Component Value Date/Time   COLORURINE AMBER (A) 02/14/2017 2111   APPEARANCEUR HAZY (A) 02/14/2017 2111   LABSPEC 1.015 02/14/2017 2111   PHURINE 5.0 02/14/2017 2111   GLUCOSEU NEGATIVE 02/14/2017 2111   HGBUR SMALL (A) 02/14/2017 2111   BILIRUBINUR NEGATIVE 02/14/2017 2111   KETONESUR NEGATIVE 02/14/2017 2111   PROTEINUR 30 (A) 02/14/2017 2111   UROBILINOGEN 0.2 09/21/2014 1908   NITRITE NEGATIVE 02/14/2017 2111   LEUKOCYTESUR TRACE (A) 02/14/2017 2111    Sepsis Labs: Lactic Acid, Venous    Component Value Date/Time   LATICACIDVEN 4.6 ([place]) 02/14/2017 1807    MICROBIOLOGY: Recent Results (from the past 240 hour(s))  Culture, blood (routine x 2)     Status: None   Collection Time: 03/11/17  8:42 AM  Result Value Ref Range Status   Specimen Description BLOOD RIGHT HAND  Final   Special Requests   Final    BOTTLES DRAWN AEROBIC ONLY Blood Culture adequate volume   Culture NO GROWTH 5 DAYS  Final   Report Status 03/16/2017 FINAL  Final  Culture, blood (routine x 2)     Status: None   Collection Time: 03/11/17  8:46 AM  Result Value Ref Range Status   Specimen Description BLOOD RIGHT HAND  Final   Special Requests   Final    BOTTLES  DRAWN AEROBIC AND ANAEROBIC Blood Culture adequate volume   Culture NO GROWTH 5 DAYS  Final   Report Status 03/16/2017 FINAL  Final  C difficile quick scan w PCR reflex     Status: None   Collection Time: 03/19/17  9:00 AM  Result Value Ref Range Status   C Diff antigen NEGATIVE NEGATIVE Final   C Diff toxin NEGATIVE NEGATIVE Final   C Diff interpretation No C. difficile detected.  Final    RADIOLOGY STUDIES/RESULTS: Ct Abdomen Wo Contrast  Result Date: 03/02/2017 CLINICAL DATA:  Preop for gastrostomy to EXAM: CT ABDOMEN WITHOUT CONTRAST TECHNIQUE: Multidetector CT imaging of the abdomen was performed following the standard protocol without IV contrast. COMPARISON:  None.  FINDINGS: Lower chest: Bibasilar dependent and medial consolidation. Large pericardial effusion. Hepatobiliary: The left lobe of the liver is prominent and anterior to the stoma. The gallbladder is not clearly visualized. It may either be decompressed or absent due to cholecystectomy. Pancreas: Unremarkable Spleen: Unremarkable Adrenals/Urinary Tract: Kidneys and adrenal glands are within normal limits. Stomach/Bowel: The stomach is positioned deep to the liver and colon. Gastrostomy tube placement may be problematic. No evidence of small-bowel obstruction. Feeding tube tip is in the proximal duodenum. Vascular/Lymphatic: Small para-aortic lymph nodes. Atherosclerotic vascular calcifications are noted. No evidence of aortic aneurysm. Other: No free-fluid. Musculoskeletal: No vertebral compression deformity. IMPRESSION: The stomach is positioned deep to a prominent left lobe of the liver as well as the transverse colon. Gas is distension of the stomach and barium opacification of the transverse colon will be essential during the procedure. Bibasilar pulmonary consolidation. Large pericardial effusion. Electronically Signed   By: Marybelle Killings M.D.   On: 03/02/2017 07:18   Mr Brain Wo Contrast  Result Date: 02/18/2017 CLINICAL DATA:  Anoxic brain injury status post cardiac arrest EXAM: MRI HEAD WITHOUT CONTRAST TECHNIQUE: Multiplanar, multiecho pulse sequences of the brain and surrounding structures were obtained without intravenous contrast. IV contrast could not be administered due to the patient's poor renal function. COMPARISON:  Head CT 02/14/2017 FINDINGS: Brain: The midline structures are normal. There is no focal diffusion restriction to indicate acute infarct. The brain parenchymal signal is normal and there is no mass lesion. There is a focal susceptibility abnormality at the left insula (series 12, image 55). Brain volume is normal for age without age-advanced or lobar predominant atrophy. The dura is  normal and there is no extra-axial collection. Vascular: Major intracranial arterial and venous sinus flow voids are preserved. Skull and upper cervical spine: The visualized skull base, calvarium, upper cervical spine and extracranial soft tissues are normal. Sinuses/Orbits: No fluid levels or advanced mucosal thickening. No mastoid effusion. Normal orbits. IMPRESSION: 1. Focus of susceptibility in the left insula may indicate a thrombosed cortical vein. The location does not correspond to a large artery. 2. No associated ischemia or hemorrhage.  Otherwise normal brain. Electronically Signed   By: Ulyses Jarred M.D.   On: 02/18/2017 14:55   Ir Fluoro Guide Cv Line Right  Result Date: 03/11/2017 INDICATION: Osteomyelitis EXAM: TUNNELED RIGHT JUGULAR PICC LINE PLACEMENT WITH ULTRASOUND AND FLUOROSCOPIC GUIDANCE MEDICATIONS: None. ANESTHESIA/SEDATION: None FLUOROSCOPY TIME:  Fluoroscopy Time:  minutes 30 seconds (1 mGy). COMPLICATIONS: None immediate. PROCEDURE: The patient was advised of the possible risks and complications and agreed to undergo the procedure. The patient was then brought to the angiographic suite for the procedure. The right neck was prepped with chlorhexidine, draped in the usual sterile fashion using maximum barrier technique (cap and  mask, sterile gown, sterile gloves, large sterile sheet, hand hygiene and cutaneous antiseptic). Local anesthesia was attained by infiltration with 1% lidocaine. Ultrasound demonstrated patency of the right jugular vein, and this was documented with an image. Under real-time ultrasound guidance, this vein was accessed with a 21 gauge micropuncture needle and image documentation was performed. A long subcutaneous tract was employed. The needle was exchanged over a guidewire for a peel-away sheath through which a 26 cm 5 [place] double lumen power injectable PICC was advanced, and positioned with its tip at the lower SVC/right atrial junction. The cuff was  positioned in the subcutaneous tract. Fluoroscopy during the procedure and fluoro spot radiograph confirms appropriate catheter position. The catheter was flushed, secured to the skin with Prolene sutures, and covered with a sterile dressing. IMPRESSION: Successful placement of a tunneled right jugular PICC with sonographic and fluoroscopic guidance. The catheter is ready for use. Electronically Signed   By: Marybelle Killings M.D.   On: 03/11/2017 15:31   Ir US Guide Vasc Access Right  Result Date: 03/11/2017 INDICATION: Osteomyelitis EXAM: TUNNELED RIGHT JUGULAR PICC LINE PLACEMENT WITH ULTRASOUND AND FLUOROSCOPIC GUIDANCE MEDICATIONS: None. ANESTHESIA/SEDATION: None FLUOROSCOPY TIME:  Fluoroscopy Time:  minutes 30 seconds (1 mGy). COMPLICATIONS: None immediate. PROCEDURE: The patient was advised of the possible risks and complications and agreed to undergo the procedure. The patient was then brought to the angiographic suite for the procedure. The right neck was prepped with chlorhexidine, draped in the usual sterile fashion using maximum barrier technique (cap and mask, sterile gown, sterile gloves, large sterile sheet, hand hygiene and cutaneous antiseptic). Local anesthesia was attained by infiltration with 1% lidocaine. Ultrasound demonstrated patency of the right jugular vein, and this was documented with an image. Under real-time ultrasound guidance, this vein was accessed with a 21 gauge micropuncture needle and image documentation was performed. A long subcutaneous tract was employed. The needle was exchanged over a guidewire for a peel-away sheath through which a 26 cm 5 [place] double lumen power injectable PICC was advanced, and positioned with its tip at the lower SVC/right atrial junction. The cuff was positioned in the subcutaneous tract. Fluoroscopy during the procedure and fluoro spot radiograph confirms appropriate catheter position. The catheter was flushed, secured to the skin with Prolene  sutures, and covered with a sterile dressing. IMPRESSION: Successful placement of a tunneled right jugular PICC with sonographic and fluoroscopic guidance. The catheter is ready for use. Electronically Signed   By: Marybelle Killings M.D.   On: 03/11/2017 15:31   Dg Chest Port 1 View  Result Date: 03/17/2017 CLINICAL DATA:  Shortness of Breath EXAM: PORTABLE CHEST 1 VIEW COMPARISON:  03/11/2017 FINDINGS: Cardiac shadow is enlarged but stable. Tracheostomy tube, feeding catheter and right-sided jugular central line are noted. The lungs are poorly aerated but no focal infiltrate is seen. Mild crowding of the vascular markings is noted related to the poor inspiratory effort. No bony abnormality is seen. IMPRESSION: Stable cardiomegaly. Poor inspiratory effort although no focal infiltrate is seen. Electronically Signed   By: Inez Catalina M.D.   On: 03/17/2017 13:48   Dg Chest Port 1 View  Result Date: 03/11/2017 CLINICAL DATA:  Leukocytosis. EXAM: PORTABLE CHEST 1 VIEW COMPARISON:  03/03/2017 FINDINGS: Patient is rotated to the left. The cardio pericardial silhouette is enlarged. Slight improvement in left base aeration. Tracheostomy tube remains in place. A feeding tube passes into the stomach although the distal tip position is not included on the film. The visualized bony structures  of the thorax are intact. Telemetry leads overlie the chest. IMPRESSION: Persistent enlargement of the cardiopericardial silhouette without evidence for overt pulmonary edema or substantial pleural effusion. Electronically Signed   By: Misty Stanley M.D.   On: 03/11/2017 08:37   Dg Chest Port 1 View  Result Date: 03/03/2017 CLINICAL DATA:  Respiratory failure. EXAM: PORTABLE CHEST 1 VIEW COMPARISON:  03/01/2017 . FINDINGS: Tracheostomy tube and feeding tube in stable position. Cardiomegaly with mild diffuse interstitial prominence suggesting mild CHF. Persistent atelectasis and consolidation left lower lobe. Small left pleural  effusion cannot be excluded. No pneumothorax. IMPRESSION: 1. Tracheostomy tube and feeding tube in stable position. 2. Cardiomegaly with diffuse mild interstitial prominence suggesting mild CHF. 3. Persistent left lower lobe atelectasis and consolidation. Small left pleural effusion cannot be excluded . Electronically Signed   By: Marcello Moores  Register   On: 03/03/2017 07:09   Dg Chest Port 1 View  Result Date: 03/01/2017 CLINICAL DATA:  Respiratory failure EXAM: PORTABLE CHEST 1 VIEW COMPARISON:  02/27/2017 FINDINGS: 0546 hours. Patient rotated to the left. Tracheostomy tube remains in place. A feeding tube passes into the stomach although the distal tip position is not included on the film. The cardio pericardial silhouette is enlarged. Left base collapse/consolidation again noted. There is pulmonary vascular congestion without overt pulmonary edema. IMPRESSION: Rotated film with cardiomegaly and vascular congestion. Persistent left base collapse/ consolidation. Electronically Signed   By: Misty Stanley M.D.   On: 03/01/2017 07:43   Dg Chest Port 1 View  Result Date: 02/27/2017 CLINICAL DATA:  Hypoxia. EXAM: PORTABLE CHEST 1 VIEW COMPARISON:  02/26/2017. FINDINGS: Tracheostomy to in stable position. Stable cardiomegaly. Low lung volumes. Progressive left lower lobe atelectasis and infiltrate. Small left pleural effusion cannot be excluded . IMPRESSION: 1. Tracheostomy tube in stable position. 2. Progressive left lower lobe atelectasis and infiltrate. Small left pleural effusion cannot be excluded . Electronically Signed   By: Marcello Moores  Register   On: 02/27/2017 07:01   Dg Chest Port 1 View  Result Date: 02/26/2017 CLINICAL DATA:  Status post tracheostomy placement. EXAM: PORTABLE CHEST 1 VIEW COMPARISON:  Earlier today. FINDINGS: The endotracheal tube has been removed and replaced with a tracheostomy tube in satisfactory position. The nasogastric tube and esophageal probe have been removed. The left jugular  catheter has been removed. No pneumothorax. Grossly stable enlarged cardiac silhouette. Left lower lobe opacity with improvement laterally since 02/25/2017. Clear right lung. Unremarkable bones. IMPRESSION: 1. Tracheostomy tube in satisfactory position. 2. Left lower lobe atelectasis or pneumonia with mild improvement since 2 days ago. 3. Stable cardiomegaly. Electronically Signed   By: Claudie Revering M.D.   On: 02/26/2017 16:02   Dg Chest Port 1 View  Result Date: 02/26/2017 CLINICAL DATA:  Hypoxia.  Shortness of breath. EXAM: PORTABLE CHEST 1 VIEW COMPARISON:  02/25/2017. FINDINGS: Endotracheal tube, NG tube, left IJ line esophageal probe in stable position. Cardiomegaly with bilateral pulmonary interstitial infiltrates consistent with CHF. Left base atelectasis . Small left pleural effusion. Similar findings noted on prior exam. No pneumothorax. IMPRESSION: 1. Lines and tubes in stable position. 2. Cardiomegaly with bilateral pulmonary interstitial prominence and small left pleural effusion noted consistent CHF. Left base atelectasis. Similar findings noted on prior exam. Electronically Signed   By: [place]   On: 02/26/2017 07:18   Dg Chest Port 1 View  Result Date: 02/25/2017 CLINICAL DATA:  Respiratory failure, intubated patient. Diabetes, cardiomyopathy, morbid obesity, end-stage renal disease. EXAM: PORTABLE CHEST 1 VIEW COMPARISON:  Portable chest  x-ray of Feb 24, 2017 FINDINGS: The lungs are adequately inflated. The pulmonary interstitial markings are slightly less prominent today. The pulmonary vascularity remains engorged. The cardiac silhouette remains enlarged. Retrocardiac region on the left is slightly less dense. The endotracheal tube tip lies 5.7 cm above the carina. The esophagogastric tube tip projects below the inferior margin of the image. The left internal jugular venous catheter tip projects over the proximal SVC. IMPRESSION: Slight interval improvement in pulmonary interstitial  edema. Stable cardiomegaly. Persistent left lower lobe atelectasis or pneumonia. The support tubes are in reasonable position. Electronically Signed   By: David  Martinique M.D.   On: 02/25/2017 07:57   Dg Chest Port 1 View  Result Date: 02/24/2017 CLINICAL DATA:  Respiratory failure EXAM: PORTABLE CHEST 1 VIEW COMPARISON:  Two days ago FINDINGS: Endotracheal tube tip at the clavicular heads. An orogastric tube reaches the stomach. Esophageal thermistor. Left IJ central line with tip at the SVC level. Cardiomegaly and diffuse hazy opacity with cephalized blood flow. No definitive effusion. No pneumothorax. IMPRESSION: 1. Stable positioning of tubes and central line. 2. CHF pattern. Electronically Signed   By: Monte Fantasia M.D.   On: 02/24/2017 07:31   Dg Chest Port 1 View  Result Date: 02/22/2017 CLINICAL DATA:  Respiratory failure EXAM: PORTABLE CHEST 1 VIEW COMPARISON:  02/21/2017 FINDINGS: Endotracheal tube terminates 5 cm above the carina. Cardiomegaly with mild interstitial edema. Retrocardiac opacity, possibly reflecting a combination of atelectasis and pleural effusion, pneumonia not excluded. No pneumothorax. Left IJ venous catheter terminates at the cavoatrial junction. Enteric tube courses into the stomach. IMPRESSION: Endotracheal tube terminates 5 cm above the carina. Cardiomegaly with mild interstitial edema. Retrocardiac opacity, possibly atelectasis and pleural effusion, pneumonia not excluded. Electronically Signed   By: Julian Hy M.D.   On: 02/22/2017 07:30   Dg Chest Port 1 View  Result Date: 02/21/2017 CLINICAL DATA:  Endotracheal tube. EXAM: PORTABLE CHEST 1 VIEW COMPARISON:  02/19/2017 FINDINGS: Endotracheal tube terminates approximately 5 cm above the carina. Enteric tube courses into the left upper abdomen with tip not imaged. Left jugular catheter terminates over the mid SVC. The cardiac silhouette remains enlarged. Pulmonary vascular congestion and bilateral parenchymal  lung opacities have not significantly changed. No large pleural effusion or pneumothorax is identified. IMPRESSION: Unchanged pulmonary edema. Electronically Signed   By: Logan Bores M.D.   On: 02/21/2017 07:42   Dg Chest Port 1 View  Result Date: 02/19/2017 CLINICAL DATA:  36 year old female with respiratory failure. EXAM: PORTABLE CHEST 1 VIEW COMPARISON:  02/18/2017 and prior radiograph FINDINGS: An endotracheal tube with tip 3.5 cm above the carina, left IJ central venous catheter with tip overlying the lower SVC, and NG tube entering the stomach with tip off the field of view again noted. Pulmonary edema has decreased from the prior study. Continued left lower lung atelectasis/ consolidation again noted. There is no evidence of pneumothorax. No other changes are noted. IMPRESSION: Decreased pulmonary edema, otherwise unchanged appearance of the chest. Electronically Signed   By: Margarette Canada M.D.   On: 02/19/2017 08:03   Dg Abd Portable 1v  Result Date: 03/02/2017 CLINICAL DATA:  Encounter for feeding tube placement EXAM: PORTABLE ABDOMEN - 1 VIEW COMPARISON:  Portable exam 1712 hours compared to CT abdomen and pelvis of 03/01/2017 FINDINGS: Feeding tube traverses abdomen with tip projecting over distal antrum near pylorus. Cardiac silhouette appears enlarged. Visualized bowel gas pattern normal. Osseous structures unremarkable. IMPRESSION: Tip of feeding tube projects over distal gastric antrum  near pylorus. Electronically Signed   By: Lavonia Dana M.D.   On: 03/02/2017 17:21   Dg Abd Portable 1v  Result Date: 02/27/2017 CLINICAL DATA:  Feeding tube placement EXAM: PORTABLE ABDOMEN - 1 VIEW COMPARISON:  None. FINDINGS: Feeding tube with the tip projecting over the antrum of the stomach. There is no bowel dilatation to suggest obstruction. There is no evidence of pneumoperitoneum, portal venous gas or pneumatosis. There are no pathologic calcifications along the expected course of the ureters. The  osseous structures are unremarkable. IMPRESSION: Feeding tube with the tip projecting over the antrum of the stomach. Electronically Signed   By: Kathreen Devoid   On: 02/27/2017 11:05     LOS: 34 days   Reyne Dumas, MD  [facility] Pager: [phone]  If 7PM-7AM, please contact night-coverage [email or web address] Password [email or web address] 03/20/2017, 2:13 PM

## 2017-03-20 NOTE — Progress Notes (Signed)
Patient continues to be stable-respiratory wise. Patient not place on ventilator at this time. At bedside if needed. RT will continue to monitor.

## 2017-03-20 NOTE — Progress Notes (Signed)
Subjective:  Hd yest- removed 2000- seemed to tolerate well - is tracking and following some commands - pulled coretrak out Objective Vital signs in last 24 hours: Vitals:   03/20/17 0319 03/20/17 0400 03/20/17 0644 03/20/17 0731  BP: (!) 120/91 124/86  108/74  Pulse:      Resp:  20    Temp:  98.8 F (37.1 C)    TempSrc:  Axillary    SpO2:  96%    Weight:   79 kg (174 lb 2.6 oz)   Height:       Weight change: 0 kg (0 lb)  Intake/Output Summary (Last 24 hours) at 03/20/17 0944 Last data filed at 03/20/17 0000  Gross per 24 hour  Intake              700 ml  Output             2600 ml  Net            -1900 ml    Dialysis Orders: Center: [facility]  On TueThuSat, 4 hrs 15 min, Optiflux 200 EDW 114 (kg), 2.0 K, 2.0 Ca, AVF LUA  Iron Sucrose (Venofer) 50 mg IVP During Dialysis 1X Week  Mircera 75 mcg IV q  2 weeks ( last on 02/05/17) Vitamin D (Calcitriol) Oral 0.75 mcg  Po q hd    Assessment/ Plan: Pt is [name] 36 y.o. 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 Anoxic brain injury- very sad as pt previously very functional- is in there, tracking and following some commands- does not appear that family is willing to back off on anything at this time 2. ESRD - cont TTS- via AVF- planning for tomorrow 3. Anemia- hgb 8.2 up from 7.6- on max aranesp 4. Secondary hyperparathyroidism- on calcium carbonate suspension- now also started on potassium phosphate- nervous about having HD pt on this will stop but adjust binders, dialysis bath as needed 5. HTN/volume- not an issue- on lopressor but mostly due to tachycardia 6. PVD- now s/p AKA- wound vac   [name]    Labs: Basic Metabolic Panel:  Recent Labs Lab 03/14/17 0449 03/15/17 0455 03/17/17 0405 03/19/17 0504  NA 136 135 136 140  K 4.4 3.5 3.1* 3.8  CL 97* 95* 97* 99*  CO2 _0 GLUCOSE 146* 157* 175* 201*  BUN 63* 29* 95*  83*  CREATININE 5.26* 3.17* 5.81* 4.71*  CALCIUM 8.6* 8.3* 8.7* 8.9  PHOS 5.1* 3.9  --   --    Liver Function Tests:  Recent Labs Lab 03/15/17 0455 03/17/17 0405 03/19/17 0504  AST  --  21 24  ALT  --  6* 7*  ALKPHOS  --  227* 288*  BILITOT  --  1.5* 1.3*  PROT  --  6.3* 6.9  ALBUMIN 2.2* 2.1* 2.3*   No results for input(s): LIPASE, AMYLASE in the last 168 hours. No results for input(s): AMMONIA in the last 168 hours. CBC:  Recent Labs Lab 03/14/17 0449 03/15/17 0455 03/16/17 0444 03/17/17 0405 03/19/17 0504  WBC 22.8* 24.9* 23.3* 19.4* 18.6*  NEUTROABS 19.6*  --   --   --   --   HGB 7.6* 7.5* 7.6* 7.6* 8.2*  HCT 25.7* 25.5* 25.4* 26.1* 28.5*  MCV 96.6 98.1 95.5 95.3 99.0  PLT 334 359 371 402* 438*   Cardiac Enzymes: No results for input(s): CKTOTAL, CKMB,  CKMBINDEX, TROPONINI in the last 168 hours. CBG:  Recent Labs Lab 03/19/17 1542 03/19/17 2014 03/19/17 2303 03/20/17 0406 03/20/17 0754  GLUCAP 176* 124* 137* 168* 127*    Iron Studies: No results for input(s): IRON, TIBC, TRANSFERRIN, FERRITIN in the last 72 hours. Studies/Results: No results found. Medications: Infusions: . sodium chloride 10 mL/hr at 03/16/17 1634  . sodium chloride    . sodium chloride    . sodium chloride    . sodium chloride    . sodium chloride    . sodium chloride      Scheduled Medications: . calcium carbonate (dosed in mg elemental calcium)  250 mg of elemental calcium Oral TID  . carvedilol  12.5 mg Oral BID WC  . chlorhexidine gluconate (MEDLINE KIT)  15 mL Mouth Rinse BID  . darbepoetin (ARANESP) injection - DIALYSIS  200 mcg Intravenous Q Tue-HD  . feeding supplement (NEPRO CARB STEADY)  1,000 mL Per Tube Q24H  . feeding supplement (PRO-STAT SUGAR FREE 64)  60 mL Per Tube QID  . [name]'s butt cream   Topical QID  . heparin  5,000 Units Subcutaneous Q8H  . insulin aspart  0-15 Units Subcutaneous Q4H  . mouth rinse  15 mL Mouth Rinse QID  . multivitamin  1  tablet Oral QHS  . pantoprazole sodium  40 mg Per Tube Q24H  . potassium & sodium phosphates  1 packet Per Tube TID WC & HS  . saccharomyces boulardii  250 mg Oral BID  . sodium chloride flush  10-40 mL Intracatheter Q12H    have reviewed scheduled and prn medications.  Physical Exam: General: tracking me- following some commands- on just O2 via trach- no vent Heart: tachy Lungs: mostly clear Abdomen: obese, soft Extremities: foot in boot- AKA with wound vac Dialysis Access: avf on left - good thrill and bruit   03/20/2017,9:44 AM  LOS: 34 days

## 2017-03-20 NOTE — Progress Notes (Signed)
Cortrak Tube Team Note:  Consult received to place a Cortrak feeding tube.   A 10 F Cortrak tube was placed in the R nare and secured with a nasal bridle at 79 cm. Per the Cortrak monitor reading the tube tip is post pyloric.   No x-ray is required. RN may begin using tube.   If the tube becomes dislodged please keep the tube and contact the Cortrak team at [email or web address] (password [email or web address]) for replacement.  If after hours and replacement cannot be delayed, place a NG tube and confirm placement with an abdominal x-ray.    Kendell Bane RD, LDN, CNSC [phone] Pager [phone] After Hours Pager

## 2017-03-21 ENCOUNTER — Inpatient Hospital Stay (HOSPITAL_COMMUNITY): Payer: Medicaid Other

## 2017-03-21 LAB — GLUCOSE, CAPILLARY
GLUCOSE-CAPILLARY: 190 mg/dL — AB (ref 65–99)
GLUCOSE-CAPILLARY: 235 mg/dL — AB (ref 65–99)
Glucose-Capillary: 143 mg/dL — ABNORMAL HIGH (ref 65–99)
Glucose-Capillary: 197 mg/dL — ABNORMAL HIGH (ref 65–99)
Glucose-Capillary: 199 mg/dL — ABNORMAL HIGH (ref 65–99)
Glucose-Capillary: 195 mg/dL — ABNORMAL HIGH (ref 65–99)

## 2017-03-21 LAB — RENAL FUNCTION PANEL
ANION GAP: 18 — AB (ref 5–15)
Albumin: 2.8 g/dL — ABNORMAL LOW (ref 3.5–5.0)
BUN: 73 mg/dL — ABNORMAL HIGH (ref 6–20)
CHLORIDE: 99 mmol/L — AB (ref 101–111)
CO2: 26 mmol/L (ref 22–32)
Calcium: 9.4 mg/dL (ref 8.9–10.3)
Creatinine, Ser: 4.99 mg/dL — ABNORMAL HIGH (ref 0.44–1.00)
GFR, EST AFRICAN AMERICAN: 12 mL/min — AB (ref >60)
GFR, EST NON AFRICAN AMERICAN: 10 mL/min — AB (ref >60)
Glucose, Bld: 206 mg/dL — ABNORMAL HIGH (ref 65–99)
POTASSIUM: 4.2 mmol/L (ref 3.5–5.1)
Phosphorus: 5.6 mg/dL — ABNORMAL HIGH (ref 2.5–4.6)
Sodium: 143 mmol/L (ref 135–145)

## 2017-03-21 LAB — CBC
HEMATOCRIT: 30.7 % — AB (ref 36.0–46.0)
HEMOGLOBIN: 8.8 g/dL — AB (ref 12.0–15.0)
MCH: 28.4 pg (ref 26.0–34.0)
MCHC: 28.7 g/dL — ABNORMAL LOW (ref 30.0–36.0)
MCV: 99 fL (ref 78.0–100.0)
PLATELETS: 555 10*3/uL — AB (ref 150–400)
RBC: 3.1 MIL/uL — AB (ref 3.87–5.11)
RDW: 17.6 % — ABNORMAL HIGH (ref 11.5–15.5)
WBC: 19.4 10*3/uL — AB (ref 4.0–10.5)

## 2017-03-21 LAB — TROPONIN I: Troponin I: 0.04 ng/mL (ref <0.03)

## 2017-03-21 NOTE — Progress Notes (Signed)
Modified Barium Swallow Progress Note  Patient Details  Name: [name] MRN: [id number] Date of Birth: 09/04/1981  Today's Date: 03/21/2017  Modified Barium Swallow completed.  Full report located under Chart Review in the Imaging Section.  Brief recommendations include the following:  Clinical Impression  Unfortunately pts arousal was a major barrier to success on this exam. Pt fully alert during bedside eval the preceeding day, and awake but drowsy on arrival to radiology. When she was transferred to the chair and positioned, pt could not appropriately sustain arousal without max cues. SLP was able to provide stimuli and tactile cueing for one bite of puree. Pt orally transited this eventually and triggered a weak, ineffective swallow leaving the majority of the bolus in pharynx. With max cues and painful stimuli SLP was able to cue pt to trigger two more swallows to clear bolus from pharynx. At that time the study was discontinued due to risk to pt and inability to progress further. This is likely not indicative of pts function and there is potential for ability to consume PO, but pt will need to be fully alert and participatory. Will follow for further attempts.    Swallow Evaluation Recommendations       SLP Diet Recommendations: NPO;Alternative means - temporary                                Elaysia Devargas, [name] 03/21/2017,9:54 AM

## 2017-03-21 NOTE — Progress Notes (Addendum)
PROGRESS NOTE        PATIENT DETAILS Name: [name] Age: 36 y.o. Sex: female Date of Birth: 08/22/1981 Admit Date: 02/14/2017 Admitting Physician Lady Deutscher, MD KCM:[id number], Helene Kelp, FNP  Note-care assumed by me on 6/18  Brief Narrative: Patient is a 36 y.o. female with history of ESRD on HD, poorly controlled diabetes, history of bilateral transmetatarsal foot amputation with subsequent left foot wound dehiscence (refused BKA as outpatient), chronic systolic heart failure felt to be secondary to nonischemic cardiomyopathy admitted on 02/14/17 with wound dehiscence and persistent drainage from the left trans-metatarsal amputation site, she was admitted to the [facility]. She was subsequently found to be unresponsive in her room on 5/19 after getting IV Dilaudid-she was  in asystole, CPR was started, with ROSC in around 9 minutes.She was then transferred to the intensive care unit, and subsequently underwent cooling and then rewarming. She was unable to be liberated off the ventilator, and as a result underwent tracheostomy on 5/31. Patient has been evaluated by neurology during this hospital stay, per neurology chances of meaningful neurological recovery is dismal. Hospital course has now been complicated by persistent leukocytosis, respiratory failure, and wound dehiscence with gangrene causing persistent SIRS pathophysiology. 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 On ATC overnight .  PCCM following for resp issues -tracheostomy in place. Trach replaced for same size #6. Critical care evaluated patient 6/22  recommend maintaining current Trach  Size. S/P MBS 6/23, recommendation is NPO .   holding  off on PEG  for now.  Patient pulled out her CoRTRAK  tube few days ago and it was replaced     Diarrhea reglab dc'd probiotic  DC rectal tube soon   Cardiac arrest on 5/19 with anoxic brain injury:  She seems to be slowly improving, following all major commands. Evaluated by neurology on 5/29-felt to have poor overall prognoses for any meaningful recovery.     Systemic inflammatory response syndrome:/Persistent leukocytosis Continues to be intermittently febrile (low-grade), tachycardic-and has persistent leukocytosis white count slightly improved from 23.3-> 18.6  which is slowly improving. Likely secondary to left foot gangrene/infection and abscess-she is now status post AKA and still continues to have these symptoms. Blood cultures on 6/1, 6/13 continue to be negative. . Chest x-ray on 6/13 negative for pneumonia. C. difficile studies on 6/6 negative,   repeat c diff pending , check due to prolonged course of antibiotics .Still has a rectal tube in place. Marland Kitchen She was on vancomycin and Tressie Ellis that was started on 6/14, and subsequently discontinued on 6/16 after AKA. Spoke with Dr Linus Salmons- who has evaluated this patient in the past-he recommends  vancomycin and Fortaz through 6/20, now completed .    Low frade fever again 6/23 , repeat CXR , BC for fever >101   Left trans-metatarsal stump wound dehiscence with infection and gangrene: Per operative note-pus/abscess noted extending up to the knee when BKA was attempted, subsequently underwent a AKA. Note, initially family was very reluctant to proceed with amputation, but given persistent leukocytosis/fever and potential risk of sepsis, family agreed for amputation, subsequently AKA was done on 6/15.  She  has a wound VAC in place which has been discontinued . Pain seems to be under control  ESRD: Nephrology following-HD on TTS. Last hemodialysis 6/21 , HD terminated 6/23 due to tachycardia and hypotension  Anemia: Hemoglobin 7.5-9.0  Likely secondary to  chronic disease-probably worsened by acute illness. No signs of bleeding, hemoglobin has been stable around 8.2  DM-2: CBGs stable-continue with SSI.   Chronic systolic heart failure/nonischemic cardiomyopathy (EF 30-35% by TEE on 6/4): Volume status remained stable-this is managed with dialysis-continue with Coreg.   Pericardial effusion: Seen on TEE-with no evidence of tamponade pathophysiology. Repeat echo at some point in the next few weeks.  Moderate protein calorie malnutrition: Continue NG tube feedings  Hypokalemia-stable  Goals of care: Unfortunate 36 year old female with ESRD, cardiomyopathy, bilateral transmetatarsal amputations complicated by left stump dehiscence-suffered a cardiac arrest on admission-now with anoxic brain injury-ventilator dependent-tracheostomy in place, awaiting  initiation of oral feeding. Hospital course has been prolonged-complicated by failure to wean from the ventilator, persistent systemic inflammatory response syndrome due to left foot gangrene/infection-after extensive discussion with family-she remains a full code, with family choosing to continue with full aggressive care. 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 Her prognosis is still very guarded at this time, will await attempts to see if she can be liberated off the ventilator. Family is aware of the risk of decompensation and deterioration.     Telemetry (independently reviewed): Sinus tachycardia  Studies: Lower extremity ultrasound 5/18 >> no evidence of DVT CT head 5/19 >> normal exam Echo 5/20 >> EF 30-35%, increased LVF. Diffuse hypokinesis, akinesis of basilar mid-inferior myocardium, grade 2 diastolic dysfxn  EEG 0/93 >> finding c/w mod to severe global cerebral dysfxn. C/w anoxic injury given clinical course  LE [place] 5/21 >> negative MRI brain 5/24 >> ?thrombosed cortical vein, otherwise normal TEE 6/4 >> mild MR, normal AV, mild TR, mild PR, EF 30-35%, diffuse  hypokinesis, no thrombus, no PFO, normal RV, moderate pericardial effusion with synechia suggesting some chronicity, no vegetations   Echo (reviewed):EF 30-35% on TEE done on 6/4  Morning labs/Imaging ordered: yes  DVT Prophylaxis: Prophylactic Heparin   Code Status: Full code   Family Communication: Mother at bedside, discussed on a daily basis  Disposition Plan: Remain inpatient in SDU  Antimicrobial agents: Anti-infectives    Start     Dose/Rate Route Frequency Ordered Stop   03/17/17 1800  cefTAZidime (FORTAZ) 2 g in dextrose 5 % 50 mL IVPB  Status:  Discontinued     2 g 100 mL/hr over 30 Minutes Intravenous Every T-Th-Sa (1800) 03/15/17 1143 03/18/17 1223   03/17/17 1200  vancomycin (VANCOCIN) IVPB 1000 mg/200 mL premix  Status:  Discontinued     1,000 mg 200 mL/hr over 60 Minutes Intravenous Every T-Th-Sa (Hemodialysis) 03/15/17 1143 03/18/17 1223   03/14/17 1200  vancomycin (VANCOCIN) IVPB 1000 mg/200 mL premix  Status:  Discontinued     1,000 mg 200 mL/hr over 60 Minutes Intravenous Every T-Th-Sa (Hemodialysis) 03/12/17 0912 03/14/17 1542   03/13/17 1430  ceFAZolin (ANCEF) IVPB 2g/100 mL premix  Status:  Discontinued     2 g 200 mL/hr over 30 Minutes Intravenous To ShortStay Surgical 03/12/17 1208 03/13/17 1113   03/13/17 1115  ceFAZolin (ANCEF) IVPB 2g/100 mL premix     2 g 200 mL/hr over 30 Minutes Intravenous To Surgery 03/13/17 1108 03/13/17 1506   03/12/17 1800  cefTAZidime (FORTAZ) 2 g in dextrose 5 % 50 mL IVPB  Status:  Discontinued     2 g 100 mL/hr over 30 Minutes Intravenous Every T-Th-Sa (1800) 03/12/17 0912 03/14/17 1542   03/12/17 1200  vancomycin (VANCOCIN) 1,500 mg in sodium chloride 0.9 % 250 mL IVPB     1,500 mg 250 mL/hr over 60 Minutes Intravenous Every Thu (Hemodialysis) 03/12/17 0912 03/12/17 1356   03/02/17 1200  cefTRIAXone (ROCEPHIN) 2 g in dextrose 5 % 50 mL IVPB     2 g 100 mL/hr over 30 Minutes Intravenous Every 24 hours 03/02/17 0810  03/06/17 1358   03/02/17 1200  vancomycin (VANCOCIN) IVPB 1000 mg/200 mL premix     1,000 mg 200 mL/hr over 60 Minutes Intravenous Every M-W-F (Hemodialysis) 03/02/17 0815 03/02/17 1457   03/02/17 1200  metroNIDAZOLE (FLAGYL) IVPB 500 mg     500 mg 100 mL/hr over 60 Minutes Intravenous Every 8 hours 03/02/17 0948 03/06/17 2230   02/26/17 1200  vancomycin (VANCOCIN) IVPB 1000 mg/200 mL premix     1,000 mg 200 mL/hr over 60 Minutes Intravenous Every T-Th-Sa (Hemodialysis) 02/25/17 1151 03/05/17 1325   02/25/17 1200  vancomycin (VANCOCIN) 2,000 mg in sodium chloride 0.9 % 500 mL IVPB     2,000 mg 250 mL/hr over 120 Minutes Intravenous  Once 02/25/17 1149 02/25/17 1449   02/25/17 0900  cefTRIAXone (ROCEPHIN) 2 g in dextrose 5 % 50 mL IVPB  Status:  Discontinued     2 g 100 mL/hr over 30 Minutes Intravenous Every 24 hours 02/25/17 0814 03/02/17 0810   02/25/17 0900  metroNIDAZOLE (FLAGYL) IVPB 500 mg  Status:  Discontinued     500 mg 100 mL/hr over 60 Minutes Intravenous Every 8 hours 02/25/17 0814 03/02/17 0948   02/20/17 1200  vancomycin (VANCOCIN) IVPB 1000 mg/200 mL premix  Status:  Discontinued     1,000 mg 200 mL/hr over 60 Minutes Intravenous Every M-W-F (Hemodialysis) 02/19/17 1506 02/20/17 1403   02/17/17 1800  meropenem (MERREM) 500 mg in sodium chloride 0.9 % 50 mL IVPB  Status:  Discontinued     500 mg 100 mL/hr over 30 Minutes Intravenous Daily-1800 02/17/17 1026 02/25/17 0814   02/17/17 1200  vancomycin (VANCOCIN) IVPB 1000 mg/200 mL premix     1,000 mg 200 mL/hr over 60 Minutes Intravenous Every T-Th-Sa (Hemodialysis) 02/14/17 1758 02/19/17 1612   02/14/17 2200  clindamycin (CLEOCIN) IVPB 900 mg  Status:  Discontinued     900 mg 100 mL/hr over 30 Minutes Intravenous Every 8 hours 02/14/17 1106 02/14/17 1928   02/14/17 2000  clindamycin (CLEOCIN) IVPB 900 mg  Status:  Discontinued     900 mg 100 mL/hr over 30 Minutes Intravenous Every 8 hours 02/14/17 1928 02/16/17 1047    02/14/17 1915  piperacillin-tazobactam (ZOSYN) IVPB 3.375 g  Status:  Discontinued     3.375 g 100 mL/hr over 30 Minutes Intravenous Every 12 hours 02/14/17 1801 02/17/17 1026   02/14/17 1845  vancomycin (VANCOCIN) 2,000 mg in sodium chloride 0.9 % 500 mL IVPB     2,000 mg 250 mL/hr over 120 Minutes Intravenous  Once 02/14/17 1758 02/14/17 2107   02/14/17 1115  clindamycin (CLEOCIN) IVPB 900 mg  Status:  Discontinued     900 mg 100 mL/hr over 30 Minutes Intravenous  Once 02/14/17 1106 02/14/17 2236   02/14/17 0945  clindamycin (CLEOCIN) IVPB 600 mg     600 mg 100 mL/hr over 30 Minutes Intravenous  Once 02/14/17 0931 02/14/17 1013      Procedures: ETT 5/19 >>  5/31 Lt IJ CVL 5/19 >> out Trach 5/31 >>  TEE 6/4 >> mild MR, normal AV, mild TR, mild PR, EF 30-35%, diffuse hypokinesis, no thrombus, no PFO, normal RV, moderate pericardial effusion with synechia suggesting some chronicity, no vegetations  Rt IJ TLC 6/13>> Left AKA 6/15>>  CONSULTS:  Infectious disease:  Neurology  Gastroenterology  Wound care  PCCM  Nephrology  Orthopedics  Time spent: 30 minutes-Greater than 50% of this time was spent in counseling, explanation of diagnosis, planning of further management, and coordination of care.  MEDICATIONS: Scheduled Meds: . calcium carbonate (dosed in mg elemental calcium)  250 mg of elemental calcium Oral TID  . carvedilol  12.5 mg Oral BID WC  . chlorhexidine gluconate (MEDLINE KIT)  15 mL Mouth Rinse BID  . darbepoetin (ARANESP) injection - DIALYSIS  200 mcg Intravenous Q Tue-HD  . feeding supplement (NEPRO CARB STEADY)  1,000 mL Per Tube Q24H  . feeding supplement (PRO-STAT SUGAR FREE 64)  60 mL Per Tube QID  . [name]'s butt cream   Topical QID  . heparin  5,000 Units Subcutaneous Q8H  . insulin aspart  0-15 Units Subcutaneous Q4H  . mouth rinse  15 mL Mouth Rinse QID  . multivitamin  1 tablet Oral QHS  . pantoprazole sodium  40 mg Per Tube Q24H  .  potassium & sodium phosphates  1 packet Per Tube TID WC & HS  . saccharomyces boulardii  250 mg Oral BID  . sodium chloride flush  10-40 mL Intracatheter Q12H   Continuous Infusions: . sodium chloride 10 mL/hr at 03/16/17 1634  . sodium chloride    . sodium chloride    . sodium chloride    . sodium chloride    . sodium chloride    . sodium chloride     PRN Meds:.sodium chloride, sodium chloride, sodium chloride, sodium chloride, sodium chloride, sodium chloride, acetaminophen (TYLENOL) oral liquid 160 mg/5 mL, acetaminophen **OR** acetaminophen, albuterol, alteplase, fentaNYL (SUBLIMAZE) injection, heparin, heparin, HYDROmorphone (DILAUDID) injection, lidocaine (PF), lidocaine-prilocaine, metoprolol tartrate, ondansetron **OR** ondansetron (ZOFRAN) IV, pentafluoroprop-tetrafluoroeth, sodium chloride flush   PHYSICAL EXAM: Vital signs: Vitals:   03/21/17 0800 03/21/17 0807 03/21/17 1158 03/21/17 1200  BP: 102/61   (!) 85/73  Pulse:  (!) 125 (!) 122 (!) 57  Resp: (!) 28 (!) 32 (!) 21 17  Temp: 99.5 F (37.5 C)   99.4 F (37.4 C)  TempSrc: Oral   Axillary  SpO2: 94% 95% 100% 95%  Weight:      Height:       Filed Weights   03/19/17 1300 03/20/17 0644 03/21/17 0500  Weight: 82 kg (180 lb 12.4 oz) 79 kg (174 lb 2.6 oz) 78 kg (171 lb 15.3 oz)   Body mass index is 25.77 kg/m.  General appearance:Sleeping when I walked in-easily arouses, squeeze my hands. Eyes: No scleral icterus HEENT: Atraumatic and Normocephalic Neck: supple, tracheostomy in place Resp:Good air entry bilaterally, some transmitted upper airway sounds CVS: S1 S2 regular, tachycardic GI: Bowel sounds present, Non tender and not distended with no gaurding, rigidity or rebound. Extremities: Left AKA stump-vac in place. Musculoskeletal:No digital cyanosis  I have personally reviewed following labs and imaging studies  LABORATORY DATA: CBC:  Recent Labs Lab 03/15/17 0455 03/16/17 0444 03/17/17 0405  03/19/17 0504  WBC 24.9* 23.3* 19.4* 18.6*  HGB 7.5* 7.6* 7.6* 8.2*  HCT 25.5* 25.4* 26.1* 28.5*  MCV 98.1 95.5 95.3 99.0  PLT 359 371 402* 438*  Basic Metabolic Panel:  Recent Labs Lab 03/15/17 0455 03/17/17 0405 03/19/17 0504  NA 135 136 140  K 3.5 3.1* 3.8  CL 95* 97* 99*  CO2 27 24 24   GLUCOSE 157* 175* 201*  BUN 29* 95* 83*  CREATININE 3.17* 5.81* 4.71*  CALCIUM 8.3* 8.7* 8.9  MG  --   --  2.2  PHOS 3.9  --   --     GFR: Estimated Creatinine Clearance: 16.8 mL/min (A) (by C-G formula based on SCr of 4.71 mg/dL (H)).  Liver Function Tests:  Recent Labs Lab 03/15/17 0455 03/17/17 0405 03/19/17 0504  AST  --  21 24  ALT  --  6* 7*  ALKPHOS  --  227* 288*  BILITOT  --  1.5* 1.3*  PROT  --  6.3* 6.9  ALBUMIN 2.2* 2.1* 2.3*   No results for input(s): LIPASE, AMYLASE in the last 168 hours. No results for input(s): AMMONIA in the last 168 hours.  Coagulation Profile: No results for input(s): INR, PROTIME in the last 168 hours.  Cardiac Enzymes: No results for input(s): CKTOTAL, CKMB, CKMBINDEX, TROPONINI in the last 168 hours.  BNP (last 3 results) No results for input(s): PROBNP in the last 8760 hours.  HbA1C: No results for input(s): HGBA1C in the last 72 hours.  CBG:  Recent Labs Lab 03/20/17 1930 03/21/17 0012 03/21/17 0401 03/21/17 0753 03/21/17 1154  GLUCAP 182* 190* 143* 197* 199*    Lipid Profile: No results for input(s): CHOL, HDL, LDLCALC, TRIG, CHOLHDL, LDLDIRECT in the last 72 hours.  Thyroid Function Tests: No results for input(s): TSH, T4TOTAL, FREET4, T3FREE, THYROIDAB in the last 72 hours.  Anemia Panel: No results for input(s): VITAMINB12, FOLATE, FERRITIN, TIBC, IRON, RETICCTPCT in the last 72 hours.  Urine analysis:    Component Value Date/Time   COLORURINE AMBER (A) 02/14/2017 2111   APPEARANCEUR HAZY (A) 02/14/2017 2111   LABSPEC 1.015 02/14/2017 2111   PHURINE 5.0 02/14/2017 2111   GLUCOSEU NEGATIVE  02/14/2017 2111   HGBUR SMALL (A) 02/14/2017 2111   BILIRUBINUR NEGATIVE 02/14/2017 2111   KETONESUR NEGATIVE 02/14/2017 2111   PROTEINUR 30 (A) 02/14/2017 2111   UROBILINOGEN 0.2 09/21/2014 1908   NITRITE NEGATIVE 02/14/2017 2111   LEUKOCYTESUR TRACE (A) 02/14/2017 2111    Sepsis Labs: Lactic Acid, Venous    Component Value Date/Time   LATICACIDVEN 4.6 ([place]) 02/14/2017 1807    MICROBIOLOGY: Recent Results (from the past 240 hour(s))  C difficile quick scan w PCR reflex     Status: None   Collection Time: 03/19/17  9:00 AM  Result Value Ref Range Status   C Diff antigen NEGATIVE NEGATIVE Final   C Diff toxin NEGATIVE NEGATIVE Final   C Diff interpretation No C. difficile detected.  Final    RADIOLOGY STUDIES/RESULTS: Ct Abdomen Wo Contrast  Result Date: 03/02/2017 CLINICAL DATA:  Preop for gastrostomy to EXAM: CT ABDOMEN WITHOUT CONTRAST TECHNIQUE: Multidetector CT imaging of the abdomen was performed following the standard protocol without IV contrast. COMPARISON:  None. FINDINGS: Lower chest: Bibasilar dependent and medial consolidation. Large pericardial effusion. Hepatobiliary: The left lobe of the liver is prominent and anterior to the stoma. The gallbladder is not clearly visualized. It may either be decompressed or absent due to cholecystectomy. Pancreas: Unremarkable Spleen: Unremarkable Adrenals/Urinary Tract: Kidneys and adrenal glands are within normal limits. Stomach/Bowel: The stomach is positioned deep to the liver and colon. Gastrostomy tube placement may be problematic. No evidence of small-bowel obstruction. Feeding  tube tip is in the proximal duodenum. Vascular/Lymphatic: Small para-aortic lymph nodes. Atherosclerotic vascular calcifications are noted. No evidence of aortic aneurysm. Other: No free-fluid. Musculoskeletal: No vertebral compression deformity. IMPRESSION: The stomach is positioned deep to a prominent left lobe of the liver as well as the transverse colon.  Gas is distension of the stomach and barium opacification of the transverse colon will be essential during the procedure. Bibasilar pulmonary consolidation. Large pericardial effusion. Electronically Signed   By: Marybelle Killings M.D.   On: 03/02/2017 07:18   Ir Fluoro Guide Cv Line Right  Result Date: 03/11/2017 INDICATION: Osteomyelitis EXAM: TUNNELED RIGHT JUGULAR PICC LINE PLACEMENT WITH ULTRASOUND AND FLUOROSCOPIC GUIDANCE MEDICATIONS: None. ANESTHESIA/SEDATION: None FLUOROSCOPY TIME:  Fluoroscopy Time:  minutes 30 seconds (1 mGy). COMPLICATIONS: None immediate. PROCEDURE: The patient was advised of the possible risks and complications and agreed to undergo the procedure. The patient was then brought to the angiographic suite for the procedure. The right neck was prepped with chlorhexidine, draped in the usual sterile fashion using maximum barrier technique (cap and mask, sterile gown, sterile gloves, large sterile sheet, hand hygiene and cutaneous antiseptic). Local anesthesia was attained by infiltration with 1% lidocaine. Ultrasound demonstrated patency of the right jugular vein, and this was documented with an image. Under real-time ultrasound guidance, this vein was accessed with a 21 gauge micropuncture needle and image documentation was performed. A long subcutaneous tract was employed. The needle was exchanged over a guidewire for a peel-away sheath through which a 26 cm 5 [place] double lumen power injectable PICC was advanced, and positioned with its tip at the lower SVC/right atrial junction. The cuff was positioned in the subcutaneous tract. Fluoroscopy during the procedure and fluoro spot radiograph confirms appropriate catheter position. The catheter was flushed, secured to the skin with Prolene sutures, and covered with a sterile dressing. IMPRESSION: Successful placement of a tunneled right jugular PICC with sonographic and fluoroscopic guidance. The catheter is ready for use. Electronically  Signed   By: Marybelle Killings M.D.   On: 03/11/2017 15:31   Ir US Guide Vasc Access Right  Result Date: 03/11/2017 INDICATION: Osteomyelitis EXAM: TUNNELED RIGHT JUGULAR PICC LINE PLACEMENT WITH ULTRASOUND AND FLUOROSCOPIC GUIDANCE MEDICATIONS: None. ANESTHESIA/SEDATION: None FLUOROSCOPY TIME:  Fluoroscopy Time:  minutes 30 seconds (1 mGy). COMPLICATIONS: None immediate. PROCEDURE: The patient was advised of the possible risks and complications and agreed to undergo the procedure. The patient was then brought to the angiographic suite for the procedure. The right neck was prepped with chlorhexidine, draped in the usual sterile fashion using maximum barrier technique (cap and mask, sterile gown, sterile gloves, large sterile sheet, hand hygiene and cutaneous antiseptic). Local anesthesia was attained by infiltration with 1% lidocaine. Ultrasound demonstrated patency of the right jugular vein, and this was documented with an image. Under real-time ultrasound guidance, this vein was accessed with a 21 gauge micropuncture needle and image documentation was performed. A long subcutaneous tract was employed. The needle was exchanged over a guidewire for a peel-away sheath through which a 26 cm 5 [place] double lumen power injectable PICC was advanced, and positioned with its tip at the lower SVC/right atrial junction. The cuff was positioned in the subcutaneous tract. Fluoroscopy during the procedure and fluoro spot radiograph confirms appropriate catheter position. The catheter was flushed, secured to the skin with Prolene sutures, and covered with a sterile dressing. IMPRESSION: Successful placement of a tunneled right jugular PICC with sonographic and fluoroscopic guidance. The catheter is ready for use. Electronically  Signed   By: Marybelle Killings M.D.   On: 03/11/2017 15:31   Dg Chest Port 1 View  Result Date: 03/17/2017 CLINICAL DATA:  Shortness of Breath EXAM: PORTABLE CHEST 1 VIEW COMPARISON:  03/11/2017 FINDINGS:  Cardiac shadow is enlarged but stable. Tracheostomy tube, feeding catheter and right-sided jugular central line are noted. The lungs are poorly aerated but no focal infiltrate is seen. Mild crowding of the vascular markings is noted related to the poor inspiratory effort. No bony abnormality is seen. IMPRESSION: Stable cardiomegaly. Poor inspiratory effort although no focal infiltrate is seen. Electronically Signed   By: Inez Catalina M.D.   On: 03/17/2017 13:48   Dg Chest Port 1 View  Result Date: 03/11/2017 CLINICAL DATA:  Leukocytosis. EXAM: PORTABLE CHEST 1 VIEW COMPARISON:  03/03/2017 FINDINGS: Patient is rotated to the left. The cardio pericardial silhouette is enlarged. Slight improvement in left base aeration. Tracheostomy tube remains in place. A feeding tube passes into the stomach although the distal tip position is not included on the film. The visualized bony structures of the thorax are intact. Telemetry leads overlie the chest. IMPRESSION: Persistent enlargement of the cardiopericardial silhouette without evidence for overt pulmonary edema or substantial pleural effusion. Electronically Signed   By: Misty Stanley M.D.   On: 03/11/2017 08:37   Dg Chest Port 1 View  Result Date: 03/03/2017 CLINICAL DATA:  Respiratory failure. EXAM: PORTABLE CHEST 1 VIEW COMPARISON:  03/01/2017 . FINDINGS: Tracheostomy tube and feeding tube in stable position. Cardiomegaly with mild diffuse interstitial prominence suggesting mild CHF. Persistent atelectasis and consolidation left lower lobe. Small left pleural effusion cannot be excluded. No pneumothorax. IMPRESSION: 1. Tracheostomy tube and feeding tube in stable position. 2. Cardiomegaly with diffuse mild interstitial prominence suggesting mild CHF. 3. Persistent left lower lobe atelectasis and consolidation. Small left pleural effusion cannot be excluded . Electronically Signed   By: Marcello Moores  Register   On: 03/03/2017 07:09   Dg Chest Port 1 View  Result  Date: 03/01/2017 CLINICAL DATA:  Respiratory failure EXAM: PORTABLE CHEST 1 VIEW COMPARISON:  02/27/2017 FINDINGS: 0546 hours. Patient rotated to the left. Tracheostomy tube remains in place. A feeding tube passes into the stomach although the distal tip position is not included on the film. The cardio pericardial silhouette is enlarged. Left base collapse/consolidation again noted. There is pulmonary vascular congestion without overt pulmonary edema. IMPRESSION: Rotated film with cardiomegaly and vascular congestion. Persistent left base collapse/ consolidation. Electronically Signed   By: Misty Stanley M.D.   On: 03/01/2017 07:43   Dg Chest Port 1 View  Result Date: 02/27/2017 CLINICAL DATA:  Hypoxia. EXAM: PORTABLE CHEST 1 VIEW COMPARISON:  02/26/2017. FINDINGS: Tracheostomy to in stable position. Stable cardiomegaly. Low lung volumes. Progressive left lower lobe atelectasis and infiltrate. Small left pleural effusion cannot be excluded . IMPRESSION: 1. Tracheostomy tube in stable position. 2. Progressive left lower lobe atelectasis and infiltrate. Small left pleural effusion cannot be excluded . Electronically Signed   By: Marcello Moores  Register   On: 02/27/2017 07:01   Dg Chest Port 1 View  Result Date: 02/26/2017 CLINICAL DATA:  Status post tracheostomy placement. EXAM: PORTABLE CHEST 1 VIEW COMPARISON:  Earlier today. FINDINGS: The endotracheal tube has been removed and replaced with a tracheostomy tube in satisfactory position. The nasogastric tube and esophageal probe have been removed. The left jugular catheter has been removed. No pneumothorax. Grossly stable enlarged cardiac silhouette. Left lower lobe opacity with improvement laterally since 02/25/2017. Clear right lung. Unremarkable bones. IMPRESSION:  1. Tracheostomy tube in satisfactory position. 2. Left lower lobe atelectasis or pneumonia with mild improvement since 2 days ago. 3. Stable cardiomegaly. Electronically Signed   By: Claudie Revering M.D.    On: 02/26/2017 16:02   Dg Chest Port 1 View  Result Date: 02/26/2017 CLINICAL DATA:  Hypoxia.  Shortness of breath. EXAM: PORTABLE CHEST 1 VIEW COMPARISON:  02/25/2017. FINDINGS: Endotracheal tube, NG tube, left IJ line esophageal probe in stable position. Cardiomegaly with bilateral pulmonary interstitial infiltrates consistent with CHF. Left base atelectasis . Small left pleural effusion. Similar findings noted on prior exam. No pneumothorax. IMPRESSION: 1. Lines and tubes in stable position. 2. Cardiomegaly with bilateral pulmonary interstitial prominence and small left pleural effusion noted consistent CHF. Left base atelectasis. Similar findings noted on prior exam. Electronically Signed   By: Kinsey   On: 02/26/2017 07:18   Dg Chest Port 1 View  Result Date: 02/25/2017 CLINICAL DATA:  Respiratory failure, intubated patient. Diabetes, cardiomyopathy, morbid obesity, end-stage renal disease. EXAM: PORTABLE CHEST 1 VIEW COMPARISON:  Portable chest x-ray of Feb 24, 2017 FINDINGS: The lungs are adequately inflated. The pulmonary interstitial markings are slightly less prominent today. The pulmonary vascularity remains engorged. The cardiac silhouette remains enlarged. Retrocardiac region on the left is slightly less dense. The endotracheal tube tip lies 5.7 cm above the carina. The esophagogastric tube tip projects below the inferior margin of the image. The left internal jugular venous catheter tip projects over the proximal SVC. IMPRESSION: Slight interval improvement in pulmonary interstitial edema. Stable cardiomegaly. Persistent left lower lobe atelectasis or pneumonia. The support tubes are in reasonable position. Electronically Signed   By: David  Martinique M.D.   On: 02/25/2017 07:57   Dg Chest Port 1 View  Result Date: 02/24/2017 CLINICAL DATA:  Respiratory failure EXAM: PORTABLE CHEST 1 VIEW COMPARISON:  Two days ago FINDINGS: Endotracheal tube tip at the clavicular heads. An orogastric  tube reaches the stomach. Esophageal thermistor. Left IJ central line with tip at the SVC level. Cardiomegaly and diffuse hazy opacity with cephalized blood flow. No definitive effusion. No pneumothorax. IMPRESSION: 1. Stable positioning of tubes and central line. 2. CHF pattern. Electronically Signed   By: Monte Fantasia M.D.   On: 02/24/2017 07:31   Dg Chest Port 1 View  Result Date: 02/22/2017 CLINICAL DATA:  Respiratory failure EXAM: PORTABLE CHEST 1 VIEW COMPARISON:  02/21/2017 FINDINGS: Endotracheal tube terminates 5 cm above the carina. Cardiomegaly with mild interstitial edema. Retrocardiac opacity, possibly reflecting a combination of atelectasis and pleural effusion, pneumonia not excluded. No pneumothorax. Left IJ venous catheter terminates at the cavoatrial junction. Enteric tube courses into the stomach. IMPRESSION: Endotracheal tube terminates 5 cm above the carina. Cardiomegaly with mild interstitial edema. Retrocardiac opacity, possibly atelectasis and pleural effusion, pneumonia not excluded. Electronically Signed   By: Julian Hy M.D.   On: 02/22/2017 07:30   Dg Chest Port 1 View  Result Date: 02/21/2017 CLINICAL DATA:  Endotracheal tube. EXAM: PORTABLE CHEST 1 VIEW COMPARISON:  02/19/2017 FINDINGS: Endotracheal tube terminates approximately 5 cm above the carina. Enteric tube courses into the left upper abdomen with tip not imaged. Left jugular catheter terminates over the mid SVC. The cardiac silhouette remains enlarged. Pulmonary vascular congestion and bilateral parenchymal lung opacities have not significantly changed. No large pleural effusion or pneumothorax is identified. IMPRESSION: Unchanged pulmonary edema. Electronically Signed   By: Logan Bores M.D.   On: 02/21/2017 07:42   Dg Abd Portable 1v  Result Date: 03/02/2017  CLINICAL DATA:  Encounter for feeding tube placement EXAM: PORTABLE ABDOMEN - 1 VIEW COMPARISON:  Portable exam 1712 hours compared to CT abdomen and  pelvis of 03/01/2017 FINDINGS: Feeding tube traverses abdomen with tip projecting over distal antrum near pylorus. Cardiac silhouette appears enlarged. Visualized bowel gas pattern normal. Osseous structures unremarkable. IMPRESSION: Tip of feeding tube projects over distal gastric antrum near pylorus. Electronically Signed   By: Lavonia Dana M.D.   On: 03/02/2017 17:21   Dg Abd Portable 1v  Result Date: 02/27/2017 CLINICAL DATA:  Feeding tube placement EXAM: PORTABLE ABDOMEN - 1 VIEW COMPARISON:  None. FINDINGS: Feeding tube with the tip projecting over the antrum of the stomach. There is no bowel dilatation to suggest obstruction. There is no evidence of pneumoperitoneum, portal venous gas or pneumatosis. There are no pathologic calcifications along the expected course of the ureters. The osseous structures are unremarkable. IMPRESSION: Feeding tube with the tip projecting over the antrum of the stomach. Electronically Signed   By: Kathreen Devoid   On: 02/27/2017 11:05   Dg Swallowing Func-speech Pathology  Result Date: 03/21/2017 Objective Swallowing Evaluation: Type of Study: MBS-Modified Barium Swallow Study Patient Details Name: [name] MRN: [id number] Date of Birth: 15-Sep-1981 Today's Date: 03/21/2017 Time: SLP Start Time (ACUTE ONLY): 0905-SLP Stop Time (ACUTE ONLY): 0915 SLP Time Calculation (min) (ACUTE ONLY): 10 min Past Medical History: Past Medical History: Diagnosis Date . Anemia  . Arthritis   "left hand" (09/15/2013) . Asthma  . CHF (congestive heart failure) ([place])  . Chronic bronchitis ([place])   "just about q yr" (09/15/2013) . Chronic kidney disease   "low kidney function" (09/15/2013) ,  T/Th/Sa . COPD (chronic obstructive pulmonary disease) ([place])  . Coronary artery disease  . Hyperlipidemia  . Hypertension  . Migraine   "get them alot" (09/15/2013) . Myocardial infarction [facility]) 04/2015  NSTEMI . Normal coronary arteries   by cardiac catheterization 09/20/13 . Peripheral vascular disease ([place])  .  Pneumonia   "couple times; have it now" (09/15/2013) . PONV (postoperative nausea and vomiting)  . Restless legs  . Shortness of breath   "just recently; related to the pneumonia" (09/15/2013) . Type 1 diabetes mellitus ([place])   type 2 Past Surgical History: Past Surgical History: Procedure Laterality Date . AMPUTATION Right 06/25/2016  Procedure: Amputation Right Great Toe at the Metatarsophalangeal Joint;  Surgeon: Newt Minion, MD;  Location: [place];  Service: Orthopedics;  Laterality: Right; . AMPUTATION Bilateral 10/08/2016  Procedure: Bilateral Transmetatarsal Amputation;  Surgeon: Newt Minion, MD;  Location: [place];  Service: Orthopedics;  Laterality: Bilateral; . AMPUTATION Left 03/13/2017  Procedure: AMPUTATION ABOVE KNEE;  Surgeon: Newt Minion, MD;  Location: [place];  Service: Orthopedics;  Laterality: Left; . AV FISTULA PLACEMENT Left 03/27/2015  Procedure: CREATION RADIAL CEPHALIC ARTERIOVENOUS FISTULA;  Surgeon: Angelia Mould, MD;  Location: [place];  Service: Vascular;  Laterality: Left; . AV FISTULA PLACEMENT Left 11/23/2015  Procedure:  LEFT ARM BASILIC VEIN TRANSPOSITION  ;  Surgeon: Angelia Mould, MD;  Location: [place];  Service: Vascular;  Laterality: Left; . CESAREAN SECTION  1999; 2006 . CORONARY ANGIOGRAM  09/20/2013  Procedure: CORONARY ANGIOGRAM;  Surgeon: Lorretta Harp, MD;  Location: [facility] CATH LAB;  Service: Cardiovascular;; . FINGER SURGERY Left 1985  3rd and 4th digits reconstructed after cut off" (09/15/2013) . IR FLUORO GUIDE CV LINE RIGHT  03/11/2017 . IR US GUIDE VASC ACCESS RIGHT  03/11/2017 . PERIPHERAL VASCULAR CATHETERIZATION N/A 09/05/2016  Procedure: Abdominal Aortogram;  Surgeon: Elam Dutch, MD;  Location: [place] CV LAB;  Service: Cardiovascular;  Laterality: N/A; . PERIPHERAL VASCULAR CATHETERIZATION Bilateral 09/05/2016  Procedure: Lower Extremity Angiography;  Surgeon: Elam Dutch, MD;  Location: [place] CV LAB;  Service: Cardiovascular;   Laterality: Bilateral; . PERIPHERAL VASCULAR CATHETERIZATION Right 09/05/2016  Procedure: Peripheral Vascular Balloon Angioplasty;  Surgeon: Elam Dutch, MD;  Location: [place] CV LAB;  Service: Cardiovascular;  Laterality: Right;  peroneal and AT . SHOULDER ARTHROSCOPY WITH BICEPSTENOTOMY Right 05/10/2015  Procedure: RIGHT SHOULDER ARTHROSCOPY WITH BICEPS TENOTOMY, DEBRIDEMENT LABRAL TEAR;  Surgeon: Tania Ade, MD;  Location: [place];  Service: Orthopedics;  Laterality: Right;  Right shoulder arthroscopy biceps tenotomy, debridement labral tear . STUMP REVISION Left 01/21/2017  Procedure: . Revision Left Transmetatarsal Amputation;  Surgeon: Newt Minion, MD;  Location: [place];  Service: Orthopedics;  Laterality: Left; . TONSILLECTOMY  1997 . 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 ETT 5/19 and trach'd 5/31. Hospital course included cardiac arrest, transfer to ICU No Data Recorded Assessment / Plan / Recommendation CHL IP CLINICAL IMPRESSIONS 03/21/2017 Clinical Impression Unfortunately pts arousal was a major barrier to success on this exam. Pt fully alert during bedside eval the preceeding day, and awake but drowsy on arrival to radiology. When she was transferred to the chair and positioned, pt could not appropriately sustain arousal without max cues. SLP was able to provide stimuli and tactile cueing for one bite of puree. Pt orally transited this eventually and triggered a weak, ineffective swallow leaving the majority of the bolus in pharynx. With max cues and painful stimuli SLP was able to cue pt to trigger two more swallows to clear bolus from pharynx. At that time the study was  discontinued due to risk to pt and inability to progress further. This is likely not indicative of pts function and there is potential for ability to consume PO, but pt will need to be fully alert and participatory. Will follow for further attempts.  SLP Visit Diagnosis Dysphagia, unspecified (R13.10) Attention and concentration deficit following -- Frontal lobe and executive function deficit following -- Impact on safety and function Severe aspiration risk   CHL IP TREATMENT RECOMMENDATION 03/21/2017 Treatment Recommendations Therapy as outlined in treatment plan below   No flowsheet data found. CHL IP DIET RECOMMENDATION 03/21/2017 SLP Diet Recommendations NPO;Alternative means - temporary Liquid Administration via -- Medication Administration -- Compensations -- Postural Changes --   No flowsheet data found.  CHL IP FOLLOW UP RECOMMENDATIONS 03/21/2017 Follow up Recommendations Skilled Nursing facility   [facility] IP FREQUENCY AND DURATION 03/21/2017 Speech Therapy Frequency (ACUTE ONLY) min 2x/week Treatment Duration 2 weeks      CHL IP ORAL PHASE 03/21/2017 Oral Phase Impaired Oral - Pudding Teaspoon -- Oral - Pudding Cup -- Oral - Honey Teaspoon -- Oral - Honey Cup -- Oral - Nectar Teaspoon -- Oral - Nectar Cup -- Oral - Nectar Straw -- Oral - Thin Teaspoon -- Oral - Thin Cup -- Oral - Thin Straw -- Oral - Puree Delayed oral transit;Reduced posterior propulsion Oral - Mech Soft -- Oral - Regular -- Oral - Multi-Consistency --  Oral - Pill -- Oral Phase - Comment --  CHL IP PHARYNGEAL PHASE 03/21/2017 Pharyngeal Phase Impaired Pharyngeal- Pudding Teaspoon -- Pharyngeal -- Pharyngeal- Pudding Cup -- Pharyngeal -- Pharyngeal- Honey Teaspoon -- Pharyngeal -- Pharyngeal- Honey Cup -- Pharyngeal -- Pharyngeal- Nectar Teaspoon -- Pharyngeal -- Pharyngeal- Nectar Cup -- Pharyngeal -- Pharyngeal- Nectar Straw -- Pharyngeal -- Pharyngeal- Thin Teaspoon -- Pharyngeal -- Pharyngeal- Thin Cup -- Pharyngeal -- Pharyngeal- Thin Straw --  Pharyngeal -- Pharyngeal- Puree Reduced laryngeal elevation;Reduced tongue base retraction;Reduced pharyngeal peristalsis;Reduced epiglottic inversion;Delayed swallow initiation-vallecula;Pharyngeal residue - valleculae;Pharyngeal residue - pyriform Pharyngeal -- Pharyngeal- Mechanical Soft -- Pharyngeal -- Pharyngeal- Regular -- Pharyngeal -- Pharyngeal- Multi-consistency -- Pharyngeal -- Pharyngeal- Pill -- Pharyngeal -- Pharyngeal Comment --  No flowsheet data found. No flowsheet data found. DeBlois, Katherene Ponto 03/21/2017, 9:55 AM                LOS: 35 days   Reyne Dumas, MD  [facility] Pager: [phone]  If 7PM-7AM, please contact night-coverage [email or web address] Password [facility] 03/21/2017, 12:59 PM

## 2017-03-21 NOTE — Procedures (Addendum)
Patient seen and examined on Hemodialysis. QB 350 mL/ min UF goal out of UF.  Approximately 20 min into treatment pt became abruptly tachycardic to the 130s.  Appeared to have some EKG changes on monitor 12 lead EKG obtained showing sinus tachycardia at 137 with TWI in I, II, AVF, V4-V6.  SpO2 100%.  250 mL bolus given.  BFR decreased to 300 mL/ min.  If no improvement will terminate treatment.  Labs were drawn at the beginning of treatment and are pending.    Primary team notified.  Bufford Buttner MD  [facility] pgr [phone] 3:36 PM

## 2017-03-21 NOTE — Progress Notes (Signed)
Subjective:    is tracking and following some commands - has feeding tube replaced- on trach collar for 2 days Objective Vital signs in last 24 hours: Vitals:   03/20/17 2000 03/20/17 2300 03/21/17 0400 03/21/17 0500  BP: 101/87 110/75 95/64   Pulse:      Resp: (!) 27 (!) 24 20   Temp: 98.5 F (36.9 C) 98.8 F (37.1 C)    TempSrc: Oral Oral Oral   SpO2: 98% 100% 99%   Weight:    78 kg (171 lb 15.3 oz)  Height:       Weight change: -6 kg (-13 lb 3.6 oz)  Intake/Output Summary (Last 24 hours) at 03/21/17 0740 Last data filed at 03/21/17 0600  Gross per 24 hour  Intake              195 ml  Output                0 ml  Net              195 ml    Dialysis Orders: Center: [facility]  On TueThuSat, 4 hrs 15 min, Optiflux 200 EDW 114 (kg), 2.0 K, 2.0 Ca, AVF LUA  Iron Sucrose (Venofer) 50 mg IVP During Dialysis 1X Week  Mircera 75 mcg IV q  2 weeks ( last on 02/05/17) Vitamin D (Calcitriol) Oral 0.75 mcg  Po q hd    Assessment/ Plan: Pt is a 37 y.o. 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 Anoxic brain injury- very sad as pt previously very functional- is in there, tracking and following some commands- does not appear that family is willing to back off on anything at this time 2. ESRD - cont TTS- via AVF- planning for today 3. Anemia- hgb 8.2 up from 7.6- on max aranesp 4. Secondary hyperparathyroidism- on calcium carbonate suspension- now also started on potassium phosphate- nervous about having HD pt on this will stop but adjust binders, dialysis bath as needed 5. HTN/volume- not an issue- on lopressor but mostly due to tachycardia 6. PVD- now s/p AKA- wound vac   Sarabeth Benton A    Labs: Basic Metabolic Panel:  Recent Labs Lab 03/15/17 0455 03/17/17 0405 03/19/17 0504  NA 135 136 140  K 3.5 3.1* 3.8  CL 95* 97* 99*  CO2 27 24 24   GLUCOSE 157* 175* 201*  BUN 29* 95* 83*   CREATININE 3.17* 5.81* 4.71*  CALCIUM 8.3* 8.7* 8.9  PHOS 3.9  --   --    Liver Function Tests:  Recent Labs Lab 03/15/17 0455 03/17/17 0405 03/19/17 0504  AST  --  21 24  ALT  --  6* 7*  ALKPHOS  --  227* 288*  BILITOT  --  1.5* 1.3*  PROT  --  6.3* 6.9  ALBUMIN 2.2* 2.1* 2.3*   No results for input(s): LIPASE, AMYLASE in the last 168 hours. No results for input(s): AMMONIA in the last 168 hours. CBC:  Recent Labs Lab 03/15/17 0455 03/16/17 0444 03/17/17 0405 03/19/17 0504  WBC 24.9* 23.3* 19.4* 18.6*  HGB 7.5* 7.6* 7.6* 8.2*  HCT 25.5* 25.4* 26.1* 28.5*  MCV 98.1 95.5 95.3 99.0  PLT 359 371 402* 438*   Cardiac Enzymes: No results for input(s): CKTOTAL, CKMB, CKMBINDEX, TROPONINI in the last 168 hours. CBG:  Recent Labs Lab 03/20/17 1143 03/20/17 1553 03/20/17 1930 03/21/17 0012 03/21/17 0401  GLUCAP 143* 140* 182* 190* 143*    Iron Studies: No results for input(s): IRON, TIBC, TRANSFERRIN, FERRITIN in the last 72 hours. Studies/Results: No results found. Medications: Infusions: . sodium chloride 10 mL/hr at 03/16/17 1634  . sodium chloride    . sodium chloride    . sodium chloride    . sodium chloride    . sodium chloride    . sodium chloride      Scheduled Medications: . calcium carbonate (dosed in mg elemental calcium)  250 mg of elemental calcium Oral TID  . carvedilol  12.5 mg Oral BID WC  . chlorhexidine gluconate (MEDLINE KIT)  15 mL Mouth Rinse BID  . darbepoetin (ARANESP) injection - DIALYSIS  200 mcg Intravenous Q Tue-HD  . feeding supplement (NEPRO CARB STEADY)  1,000 mL Per Tube Q24H  . feeding supplement (PRO-STAT SUGAR FREE 64)  60 mL Per Tube QID  . [name]'s butt cream   Topical QID  . heparin  5,000 Units Subcutaneous Q8H  . insulin aspart  0-15 Units Subcutaneous Q4H  . mouth rinse  15 mL Mouth Rinse QID  . multivitamin  1 tablet Oral QHS  . pantoprazole sodium  40 mg Per Tube Q24H  . potassium & sodium phosphates  1  packet Per Tube TID WC & HS  . saccharomyces boulardii  250 mg Oral BID  . sodium chloride flush  10-40 mL Intracatheter Q12H    have reviewed scheduled and prn medications.  Physical Exam: General: tracking me- following some commands- on just O2 via trach- no vent- flailing arms some  Heart: tachy Lungs: mostly clear Abdomen: obese, soft Extremities: foot in boot- AKA with wound vac Dialysis Access: avf on left - good thrill and bruit   03/21/2017,7:40 AM  LOS: 35 days

## 2017-03-22 ENCOUNTER — Inpatient Hospital Stay (HOSPITAL_COMMUNITY): Payer: Medicaid Other

## 2017-03-22 DIAGNOSIS — R509 Fever, unspecified: Secondary | ICD-10-CM

## 2017-03-22 LAB — COMPREHENSIVE METABOLIC PANEL
ALBUMIN: 2.8 g/dL — AB (ref 3.5–5.0)
ALK PHOS: 326 U/L — AB (ref 38–126)
ALT: 13 U/L — AB (ref 14–54)
ANION GAP: 16 — AB (ref 5–15)
AST: 26 U/L (ref 15–41)
BUN: 89 mg/dL — ABNORMAL HIGH (ref 6–20)
CHLORIDE: 101 mmol/L (ref 101–111)
CO2: 27 mmol/L (ref 22–32)
CREATININE: 5.81 mg/dL — AB (ref 0.44–1.00)
Calcium: 9.4 mg/dL (ref 8.9–10.3)
GFR calc Af Amer: 10 mL/min — ABNORMAL LOW (ref >60)
GFR calc non Af Amer: 8 mL/min — ABNORMAL LOW (ref >60)
GLUCOSE: 240 mg/dL — AB (ref 65–99)
Potassium: 4.5 mmol/L (ref 3.5–5.1)
SODIUM: 144 mmol/L (ref 135–145)
Total Bilirubin: 1.5 mg/dL — ABNORMAL HIGH (ref 0.3–1.2)
Total Protein: 7.4 g/dL (ref 6.5–8.1)

## 2017-03-22 LAB — GLUCOSE, CAPILLARY
GLUCOSE-CAPILLARY: 193 mg/dL — AB (ref 65–99)
GLUCOSE-CAPILLARY: 193 mg/dL — AB (ref 65–99)
Glucose-Capillary: 196 mg/dL — ABNORMAL HIGH (ref 65–99)
Glucose-Capillary: 205 mg/dL — ABNORMAL HIGH (ref 65–99)
Glucose-Capillary: 190 mg/dL — ABNORMAL HIGH (ref 65–99)
Glucose-Capillary: 209 mg/dL — ABNORMAL HIGH (ref 65–99)

## 2017-03-22 LAB — CBC
HCT: 30.8 % — ABNORMAL LOW (ref 36.0–46.0)
HEMOGLOBIN: 8.7 g/dL — AB (ref 12.0–15.0)
MCH: 28.2 pg (ref 26.0–34.0)
MCHC: 28.2 g/dL — ABNORMAL LOW (ref 30.0–36.0)
MCV: 100 fL (ref 78.0–100.0)
PLATELETS: 483 10*3/uL — AB (ref 150–400)
RBC: 3.08 MIL/uL — AB (ref 3.87–5.11)
RDW: 17.6 % — ABNORMAL HIGH (ref 11.5–15.5)
WBC: 23.8 10*3/uL — ABNORMAL HIGH (ref 4.0–10.5)

## 2017-03-22 MED ORDER — IOPAMIDOL (ISOVUE-370) INJECTION 76%
INTRAVENOUS | Status: AC
  Administered 2017-03-22: 100 mL
  Filled 2017-03-22: qty 100

## 2017-03-22 NOTE — Progress Notes (Signed)
Subjective:   Events noted- did not tolerate HD yest- tachy to 140- treatment ended early - had fever - HR still in the 120's this AM  Objective Vital signs in last 24 hours: Vitals:   03/22/17 0430 03/22/17 0500 03/22/17 0730 03/22/17 0828  BP: (!) 88/66 102/66    Pulse:   (!) 123 (!) 123  Resp: 20  (!) 24 (!) 28  Temp:   99.4 F (37.4 C)   TempSrc:   Axillary   SpO2: 98%  97% 98%  Weight:      Height:       Weight change: 1 kg (2 lb 3.3 oz)  Intake/Output Summary (Last 24 hours) at 03/22/17 0849 Last data filed at 03/22/17 0600  Gross per 24 hour  Intake              165 ml  Output             -558 ml  Net              723 ml    Dialysis Orders: Center: [facility]  On TueThuSat, 4 hrs 15 min, Optiflux 200 EDW 114 (kg), 2.0 K, 2.0 Ca, AVF LUA  Iron Sucrose (Venofer) 50 mg IVP During Dialysis 1X Week  Mircera 75 mcg IV q  2 weeks ( last on 02/05/17) Vitamin D (Calcitriol) Oral 0.75 mcg  Po q hd    Assessment/ Plan: Pt is a 36 y.o. 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 Anoxic brain injury-  pt previously very functional- is in there, tracking and following some commands- does not appear that family is willing to back off on anything at this time 2. ESRD - cont TTS- via AVF- did not tolerate HD yest- possibly the fever exacerbated her tachycardia- will try HD again tomorrow off schedule 3. Anemia- hgb 8.7 up from 7.6- on max aranesp 4. Secondary hyperparathyroidism- on calcium carbonate suspension- now also started on potassium phosphate- nervous about having HD pt on this will stop but adjust binders, dialysis bath as needed 5. HTN/volume- not an issue- on lopressor but mostly due to tachycardia 6. PVD- now s/p AKA- wound vac 7. Fever/WBC- per primary- CXR neg   Nashley [place] A    Labs: Basic Metabolic Panel:  Recent Labs Lab 03/19/17 0504 03/21/17 1430 03/22/17 0501  NA  140 143 144  K 3.8 4.2 4.5  CL 99* 99* 101  CO2 _0 GLUCOSE 201* 206* 240*  BUN 83* 73* 89*  CREATININE 4.71* 4.99* 5.81*  CALCIUM 8.9 9.4 9.4  PHOS  --  5.6*  --    Liver Function Tests:  Recent Labs Lab 03/17/17 0405 03/19/17 0504 03/21/17 1430 03/22/17 0501  AST 21 24  --  26  ALT 6* 7*  --  13*  ALKPHOS 227* 288*  --  326*  BILITOT 1.5* 1.3*  --  1.5*  PROT 6.3* 6.9  --  7.4  ALBUMIN 2.1* 2.3* 2.8* 2.8*   No results for input(s): LIPASE, AMYLASE in the last 168 hours. No results for input(s): AMMONIA in the last 168 hours. CBC:  Recent Labs Lab 03/16/17 0444 03/17/17 0405 03/19/17 0504 03/21/17 1430 03/22/17 0501  WBC 23.3* 19.4* 18.6* 19.4* 23.8*  HGB 7.6* 7.6* 8.2* 8.8* 8.7*  HCT 25.4* 26.1* 28.5* 30.7* 30.8*  MCV 95.5 95.3 99.0 99.0 100.0  PLT 371 402* 438* 555*  483*   Cardiac Enzymes:  Recent Labs Lab 03/21/17 1855  TROPONINI 0.04*   CBG:  Recent Labs Lab 03/21/17 1154 03/21/17 1724 03/21/17 1933 03/21/17 2321 03/22/17 0355  GLUCAP 199* 195* 235* 196* 193*    Iron Studies: No results for input(s): IRON, TIBC, TRANSFERRIN, FERRITIN in the last 72 hours. Studies/Results: Dg Chest Port 1 View  Result Date: 03/21/2017 CLINICAL DATA:  Fever. EXAM: PORTABLE CHEST 1 VIEW COMPARISON:  March 17, 2017 FINDINGS: A tracheostomy tube is stable. A feeding tube terminates below today's film. A right-sided line terminates in the right atrium, unchanged. Stable cardiomegaly. No pneumothorax. No pulmonary nodules, masses, or focal infiltrates. IMPRESSION: Stable support apparatus. Cardiomegaly without overt edema. No change. Electronically Signed   By: Dorise Bullion III M.D   On: 03/21/2017 14:56   Dg Swallowing Func-speech Pathology  Result Date: 03/21/2017 Objective Swallowing Evaluation: Type of Study: MBS-Modified Barium Swallow Study Patient Details Name: [name] MRN: [id number] Date of Birth: 26-Dec-1980 Today's Date: 03/21/2017 Time: SLP Start  Time (ACUTE ONLY): 0905-SLP Stop Time (ACUTE ONLY): 0915 SLP Time Calculation (min) (ACUTE ONLY): 10 min Past Medical History: Past Medical History: Diagnosis Date . Anemia  . Arthritis   "left hand" (09/15/2013) . Asthma  . CHF (congestive heart failure) ([place])  . Chronic bronchitis ([place])   "just about q yr" (09/15/2013) . Chronic kidney disease   "low kidney function" (09/15/2013) ,  T/Th/Sa . COPD (chronic obstructive pulmonary disease) ([place])  . Coronary artery disease  . Hyperlipidemia  . Hypertension  . Migraine   "get them alot" (09/15/2013) . Myocardial infarction [facility]) 04/2015  NSTEMI . Normal coronary arteries   by cardiac catheterization 09/20/13 . Peripheral vascular disease ([place])  . Pneumonia   "couple times; have it now" (09/15/2013) . PONV (postoperative nausea and vomiting)  . Restless legs  . Shortness of breath   "just recently; related to the pneumonia" (09/15/2013) . Type 1 diabetes mellitus ([place])   type 2 Past Surgical History: Past Surgical History: Procedure Laterality Date . AMPUTATION Right 06/25/2016  Procedure: Amputation Right Great Toe at the Metatarsophalangeal Joint;  Surgeon: Newt Minion, MD;  Location: [place];  Service: Orthopedics;  Laterality: Right; . AMPUTATION Bilateral 10/08/2016  Procedure: Bilateral Transmetatarsal Amputation;  Surgeon: Newt Minion, MD;  Location: [place];  Service: Orthopedics;  Laterality: Bilateral; . AMPUTATION Left 03/13/2017  Procedure: AMPUTATION ABOVE KNEE;  Surgeon: Newt Minion, MD;  Location: [place];  Service: Orthopedics;  Laterality: Left; . AV FISTULA PLACEMENT Left 03/27/2015  Procedure: CREATION RADIAL CEPHALIC ARTERIOVENOUS FISTULA;  Surgeon: Angelia Mould, MD;  Location: [place];  Service: Vascular;  Laterality: Left; . AV FISTULA PLACEMENT Left 11/23/2015  Procedure:  LEFT ARM BASILIC VEIN TRANSPOSITION  ;  Surgeon: Angelia Mould, MD;  Location: [place];  Service: Vascular;  Laterality: Left; . CESAREAN SECTION  1999; 2006 . CORONARY  ANGIOGRAM  09/20/2013  Procedure: CORONARY ANGIOGRAM;  Surgeon: Lorretta Harp, MD;  Location: [facility] CATH LAB;  Service: Cardiovascular;; . FINGER SURGERY Left 1985  3rd and 4th digits reconstructed after cut off" (09/15/2013) . IR FLUORO GUIDE CV LINE RIGHT  03/11/2017 . IR US GUIDE VASC ACCESS RIGHT  03/11/2017 . PERIPHERAL VASCULAR CATHETERIZATION N/A 09/05/2016  Procedure: Abdominal Aortogram;  Surgeon: Elam Dutch, MD;  Location: [place] CV LAB;  Service: Cardiovascular;  Laterality: N/A; . PERIPHERAL VASCULAR CATHETERIZATION Bilateral 09/05/2016  Procedure: Lower Extremity Angiography;  Surgeon: Elam Dutch, MD;  Location: [facility]  INVASIVE CV LAB;  Service: Cardiovascular;  Laterality: Bilateral; . PERIPHERAL VASCULAR CATHETERIZATION Right 09/05/2016  Procedure: Peripheral Vascular Balloon Angioplasty;  Surgeon: Elam Dutch, MD;  Location: [place] CV LAB;  Service: Cardiovascular;  Laterality: Right;  peroneal and AT . SHOULDER ARTHROSCOPY WITH BICEPSTENOTOMY Right 05/10/2015  Procedure: RIGHT SHOULDER ARTHROSCOPY WITH BICEPS TENOTOMY, DEBRIDEMENT LABRAL TEAR;  Surgeon: Tania Ade, MD;  Location: [place];  Service: Orthopedics;  Laterality: Right;  Right shoulder arthroscopy biceps tenotomy, debridement labral tear . STUMP REVISION Left 01/21/2017  Procedure: . Revision Left Transmetatarsal Amputation;  Surgeon: Newt Minion, MD;  Location: [place];  Service: Orthopedics;  Laterality: Left; . TONSILLECTOMY  1997 . 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 ETT 5/19 and trach'd 5/31. Hospital course included cardiac arrest, transfer to ICU No Data Recorded Assessment / Plan / Recommendation CHL  IP CLINICAL IMPRESSIONS 03/21/2017 Clinical Impression Unfortunately pts arousal was a major barrier to success on this exam. Pt fully alert during bedside eval the preceeding day, and awake but drowsy on arrival to radiology. When she was transferred to the chair and positioned, pt could not appropriately sustain arousal without max cues. SLP was able to provide stimuli and tactile cueing for one bite of puree. Pt orally transited this eventually and triggered a weak, ineffective swallow leaving the majority of the bolus in pharynx. With max cues and painful stimuli SLP was able to cue pt to trigger two more swallows to clear bolus from pharynx. At that time the study was discontinued due to risk to pt and inability to progress further. This is likely not indicative of pts function and there is potential for ability to consume PO, but pt will need to be fully alert and participatory. Will follow for further attempts.  SLP Visit Diagnosis Dysphagia, unspecified (R13.10) Attention and concentration deficit following -- Frontal lobe and executive function deficit following -- Impact on safety and function Severe aspiration risk   CHL IP TREATMENT RECOMMENDATION 03/21/2017 Treatment Recommendations Therapy as outlined in treatment plan below   No flowsheet data found. CHL IP DIET RECOMMENDATION 03/21/2017 SLP Diet Recommendations NPO;Alternative means - temporary Liquid Administration via -- Medication Administration -- Compensations -- Postural Changes --   No flowsheet data found.  CHL IP FOLLOW UP RECOMMENDATIONS 03/21/2017 Follow up Recommendations Skilled Nursing facility   [facility] IP FREQUENCY AND DURATION 03/21/2017 Speech Therapy Frequency (ACUTE ONLY) min 2x/week Treatment Duration 2 weeks      CHL IP ORAL PHASE 03/21/2017 Oral Phase Impaired Oral - Pudding Teaspoon -- Oral - Pudding Cup -- Oral - Honey Teaspoon -- Oral - Honey Cup -- Oral - Nectar Teaspoon -- Oral - Nectar Cup -- Oral - Nectar Straw -- Oral - Thin  Teaspoon -- Oral - Thin Cup -- Oral - Thin Straw -- Oral - Puree Delayed oral transit;Reduced posterior propulsion Oral - Mech Soft -- Oral - Regular -- Oral - Multi-Consistency -- Oral - Pill -- Oral Phase - Comment --  CHL IP PHARYNGEAL PHASE 03/21/2017 Pharyngeal Phase Impaired Pharyngeal- Pudding Teaspoon -- Pharyngeal -- Pharyngeal- Pudding Cup -- Pharyngeal -- Pharyngeal- Honey Teaspoon -- Pharyngeal -- Pharyngeal- Honey Cup -- Pharyngeal --  Pharyngeal- Nectar Teaspoon -- Pharyngeal -- Pharyngeal- Nectar Cup -- Pharyngeal -- Pharyngeal- Nectar Straw -- Pharyngeal -- Pharyngeal- Thin Teaspoon -- Pharyngeal -- Pharyngeal- Thin Cup -- Pharyngeal -- Pharyngeal- Thin Straw -- Pharyngeal -- Pharyngeal- Puree Reduced laryngeal elevation;Reduced tongue base retraction;Reduced pharyngeal peristalsis;Reduced epiglottic inversion;Delayed swallow initiation-vallecula;Pharyngeal residue - valleculae;Pharyngeal residue - pyriform Pharyngeal -- Pharyngeal- Mechanical Soft -- Pharyngeal -- Pharyngeal- Regular -- Pharyngeal -- Pharyngeal- Multi-consistency -- Pharyngeal -- Pharyngeal- Pill -- Pharyngeal -- Pharyngeal Comment --  No flowsheet data found. No flowsheet data found. De[place], Katherene Ponto 03/21/2017, 9:55 AM              Medications: Infusions: . sodium chloride 10 mL/hr at 03/16/17 1634  . sodium chloride    . sodium chloride    . sodium chloride    . sodium chloride    . sodium chloride    . sodium chloride      Scheduled Medications: . calcium carbonate (dosed in mg elemental calcium)  250 mg of elemental calcium Oral TID  . carvedilol  12.5 mg Oral BID WC  . chlorhexidine gluconate (MEDLINE KIT)  15 mL Mouth Rinse BID  . darbepoetin (ARANESP) injection - DIALYSIS  200 mcg Intravenous Q Tue-HD  . feeding supplement (NEPRO CARB STEADY)  1,000 mL Per Tube Q24H  . feeding supplement (PRO-STAT SUGAR FREE 64)  60 mL Per Tube QID  . [name]'s butt cream   Topical QID  . heparin  5,000 Units  Subcutaneous Q8H  . insulin aspart  0-15 Units Subcutaneous Q4H  . mouth rinse  15 mL Mouth Rinse QID  . multivitamin  1 tablet Oral QHS  . pantoprazole sodium  40 mg Per Tube Q24H  . potassium & sodium phosphates  1 packet Per Tube TID WC & HS  . saccharomyces boulardii  250 mg Oral BID  . sodium chloride flush  10-40 mL Intracatheter Q12H    have reviewed scheduled and prn medications.  Physical Exam: General: tracking me- following some commands- on just O2 via trach-  flailing arms some  Heart: tachy Lungs: mostly clear- louder sounds- ?wheeze Abdomen: obese, soft Extremities: foot in boot- AKA with wound vac Dialysis Access: avf on left - good thrill and bruit   03/22/2017,8:49 AM  LOS: 36 days

## 2017-03-22 NOTE — Progress Notes (Signed)
CRITICAL VALUE ALERT  Critical Value:  Troponin 0.04  Date & Time Notied:  03/22/2017 2145  Provider Notified: Dr. Antionette Char at 12:34 AM   Orders Received/Actions taken: call back at 12:35 AM, no new orders

## 2017-03-22 NOTE — Progress Notes (Signed)
03/22/2017 Central monitor called patient had 7 runs of Vtach at [phone] and it was reported Heartrate was 149. Dr Selena Batten was on the unit and was made aware. [facility] RN.

## 2017-03-22 NOTE — Progress Notes (Addendum)
Patient ID: [name], female   DOB: 11-14-1980, 36 y.o.   MRN: [id number]                                                                PROGRESS NOTE                                                                                                                                                                                                             Patient Demographics:    [name], is a 36 y.o. female, DOB - Sep 15, 1981, SEG:[id number]  Admit date - 02/14/2017   Admitting Physician Lady Deutscher, MD  Outpatient Primary MD for the patient is [name], [place]  LOS - 36  Outpatient Specialists:     Chief Complaint  Patient presents with  . Leg Pain  . Diarrhea       Brief Narrative   36 y.o. female with history of ESRD on HD, poorly controlled diabetes, history of bilateral transmetatarsal foot amputation with subsequent left foot wound dehiscence (refused BKA as outpatient), chronic systolic heart failure felt to be secondary to nonischemic cardiomyopathy admitted on 02/14/17 with wound dehiscence and persistent drainage from the left trans-metatarsal amputation site, she was admitted to the [facility]. She was subsequently found to be unresponsive in her room on 5/19 after getting IV Dilaudid-she was  in asystole, CPR was started, with ROSC in around 9 minutes.She was then transferred to the intensive care unit, and subsequently underwent cooling and then rewarming. She was unable to be liberated off the ventilator, and as a result underwent tracheostomy on 5/31. Patient has been evaluated by neurology during this hospital stay, per neurology chances of meaningful neurological recovery is dismal. Hospital course has now been complicated by persistent leukocytosis, respiratory failure, and wound dehiscence with gangrene causing persistent SIRS pathophysiology. 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  She is unable to communicate.  Speech recommended continuation of NPO.  CXR negative.  Intermittent tachycardia.   Per [name] her mother no significant change in her status.    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)   Diabetic osteomyelitis ([place])   Dehiscence of amputation stump (HCC)   Osteomyelitis (HCC)   Chronic pain syndrome   Cardiac arrest, cause unspecified ([place])   Pressure injury of skin   Wound dehiscence   Coma ([place])   Elevated liver enzymes   Jaundice   Encounter for central line placement   Protein calorie malnutrition ([place])   DNR (do not resuscitate) discussion   Palliative care by specialist   Tachycardia   Goals of care, counseling/discussion   Anoxic brain injury [facility])   Palliative care encounter   Tracheostomy status ([place])   Abscess of left leg   Hypoxemia   Acute /chronic hypoxemic respiratory failure in a setting of cardiac arrest:   Patient required ventilator night of 6/19-6/20. On ATC overnight .  PCCM following for resp issues -tracheostomy in place. Trach replaced for same size #6. Critical care evaluated patient 6/22  recommend maintaining current Trach  Size. S/P MBS 6/23, recommendation is NPO . holding  off on PEG for now.  Patient pulled out her CoRTRAK  tube few days ago and it was replaced     Diarrhea reglan d/c probiotic  DC rectal tube soon   Cardiac arrest on 5/19 with anoxic brain injury:  She seems to be slowly improving, following all major commands. Evaluated by neurology on  5/29-felt to have poor overall prognoses for any meaningful recovery.     Systemic inflammatory response syndrome:/Persistent leukocytosis Continues to be intermittently febrile (low-grade), tachycardic-and has persistent leukocytosis white count slightly improved from 23.3-> 18.6  which is slowly improving. Likely secondary to left foot gangrene/infection and abscess-she is now status post AKA and still continues to have these symptoms. Blood cultures on 6/1, 6/13 continue to be negative. . Chest x-ray on 6/13 negative for pneumonia. C. difficile studies on 6/6 negative,   repeat c diff pending , check due to prolonged course of antibiotics .Still has a rectal tube in place. Marland Kitchen She was on vancomycin and Tressie Ellis that was started on 6/14, and subsequently discontinued on 6/16 after AKA. Spoke with Dr Linus Salmons- who has evaluated this patient in the past-he recommends  vancomycin and Fortaz through 6/20, now completed .    Fever (low grade) Low frade fever again 6/23 , repeat CXR 6/23=> negative, BC for fever >101 Due to fever and tachycardia Check CTA chest r/o PE, (helps evaluate pericardial effusion)  Left trans-metatarsal stump wound dehiscence with infection and gangrene: Per operative note-pus/abscess noted extending up to the knee when BKA was attempted, subsequently underwent a AKA. Note, initially family was very reluctant to proceed with amputation, but given persistent leukocytosis/fever and potential risk of sepsis, family agreed for amputation, subsequently AKA was done on 6/15.  She  has a wound VAC in place which has been discontinued . Pain seems to be under control  ESRD: Nephrology following-HD on TTS. Last hemodialysis 6/21 , HD terminated 6/23 due to tachycardia and hypotension  Anemia: Hemoglobin 7.5-9.0  Likely secondary to chronic disease-probably worsened by acute illness. No signs of bleeding, hemoglobin has been stable around 8.2  DM-2: CBGs stable-continue with SSI.  Add  levemir 5 units [place] qday  Chronic systolic heart failure/nonischemic cardiomyopathy (EF 30-35% by TEE on 6/4): Volume status remained stable-this is managed with dialysis-continue with Coreg.   Pericardial effusion: Seen on TEE-with no evidence of tamponade pathophysiology. Repeat echo at some point in the next few weeks.  Moderate protein calorie malnutrition: Continue NG tube feedings  Hypokalemia-stable  Goals of care: Unfortunate 36 year old female with ESRD, cardiomyopathy, bilateral transmetatarsal amputations complicated by left stump dehiscence-suffered a cardiac arrest on admission-now with anoxic brain injury-ventilator dependent-tracheostomy in place, awaiting  initiation of oral feeding. Hospital course has been prolonged-complicated by failure to wean from the ventilator, persistent systemic inflammatory response syndrome due to left foot gangrene/infection-after extensive discussion with family-she remains a full code, with family choosing to continue with full aggressive care. 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 Her prognosis is still very guarded at this time, will await attempts to see if she can be liberated off the ventilator. Family is aware of the risk of decompensation and deterioration.     Telemetry (independently reviewed): Sinus tachycardia   Studies: Lower extremity ultrasound 5/18 >> no evidence of DVT CT head 5/19 >> normal exam Echo 5/20 >>EF 30-35%, increased LVF. Diffuse hypokinesis, akinesis of basilar mid-inferior myocardium, grade 2 diastolic dysfxn  EEG 7/06 >>finding c/w mod to severe global cerebral dysfxn. C/w anoxic injury given clinical course  LE [place] 5/21 >> negative MRI brain 5/24 >> ?thrombosed cortical vein, otherwise normal TEE 6/4 >>mild MR, normal AV, mild TR, mild PR, EF 30-35%, diffuse hypokinesis, no thrombus, no PFO, normal RV, moderate pericardial effusion with synechia suggesting some chronicity, no  vegetations   Echo (reviewed):EF 30-35% on TEE done on 6/4  Morning labs/Imaging ordered: yes  DVT Prophylaxis: Prophylactic Heparin   Code Status: Full code   Family Communication: Mother at bedside, discussed on a daily basis  Dispo: continue SDU  Lab Results  Component Value Date   PLT 483 (H) 03/22/2017      Anti-infectives    Start     Dose/Rate Route Frequency Ordered Stop   03/17/17 1800  cefTAZidime (FORTAZ) 2 g in dextrose 5 % 50 mL IVPB  Status:  Discontinued     2 g 100 mL/hr over 30 Minutes Intravenous Every T-Th-Sa (1800) 03/15/17 1143 03/18/17 1223   03/17/17 1200  vancomycin (VANCOCIN) IVPB 1000 mg/200 mL premix  Status:  Discontinued     1,000 mg 200 mL/hr over 60 Minutes Intravenous Every T-Th-Sa (Hemodialysis) 03/15/17 1143 03/18/17 1223   03/14/17 1200  vancomycin (VANCOCIN) IVPB 1000 mg/200 mL premix  Status:  Discontinued     1,000 mg 200 mL/hr over 60 Minutes Intravenous Every T-Th-Sa (Hemodialysis) 03/12/17 0912 03/14/17 1542   03/13/17 1430  ceFAZolin (ANCEF) IVPB 2g/100 mL premix  Status:  Discontinued     2 g 200 mL/hr over 30 Minutes Intravenous To ShortStay Surgical 03/12/17 1208 03/13/17 1113   03/13/17 1115  ceFAZolin (ANCEF) IVPB 2g/100 mL premix     2 g 200 mL/hr over 30 Minutes Intravenous To Surgery 03/13/17 1108 03/13/17 1506   03/12/17 1800  cefTAZidime (FORTAZ) 2 g in dextrose 5 % 50 mL IVPB  Status:  Discontinued     2 g 100 mL/hr over 30 Minutes Intravenous Every T-Th-Sa (1800) 03/12/17 0912 03/14/17 1542   03/12/17 1200  vancomycin (VANCOCIN) 1,500 mg in sodium chloride 0.9 % 250 mL IVPB     1,500 mg 250 mL/hr over 60 Minutes Intravenous Every  Thu (Hemodialysis) 03/12/17 0912 03/12/17 1356   03/02/17 1200  cefTRIAXone (ROCEPHIN) 2 g in dextrose 5 % 50 mL IVPB     2 g 100 mL/hr over 30 Minutes Intravenous Every 24 hours 03/02/17 0810 03/06/17 1358   03/02/17 1200  vancomycin (VANCOCIN) IVPB 1000 mg/200 mL premix     1,000  mg 200 mL/hr over 60 Minutes Intravenous Every M-W-F (Hemodialysis) 03/02/17 0815 03/02/17 1457   03/02/17 1200  metroNIDAZOLE (FLAGYL) IVPB 500 mg     500 mg 100 mL/hr over 60 Minutes Intravenous Every 8 hours 03/02/17 0948 03/06/17 2230   02/26/17 1200  vancomycin (VANCOCIN) IVPB 1000 mg/200 mL premix     1,000 mg 200 mL/hr over 60 Minutes Intravenous Every T-Th-Sa (Hemodialysis) 02/25/17 1151 03/05/17 1325   02/25/17 1200  vancomycin (VANCOCIN) 2,000 mg in sodium chloride 0.9 % 500 mL IVPB     2,000 mg 250 mL/hr over 120 Minutes Intravenous  Once 02/25/17 1149 02/25/17 1449   02/25/17 0900  cefTRIAXone (ROCEPHIN) 2 g in dextrose 5 % 50 mL IVPB  Status:  Discontinued     2 g 100 mL/hr over 30 Minutes Intravenous Every 24 hours 02/25/17 0814 03/02/17 0810   02/25/17 0900  metroNIDAZOLE (FLAGYL) IVPB 500 mg  Status:  Discontinued     500 mg 100 mL/hr over 60 Minutes Intravenous Every 8 hours 02/25/17 0814 03/02/17 0948   02/20/17 1200  vancomycin (VANCOCIN) IVPB 1000 mg/200 mL premix  Status:  Discontinued     1,000 mg 200 mL/hr over 60 Minutes Intravenous Every M-W-F (Hemodialysis) 02/19/17 1506 02/20/17 1403   02/17/17 1800  meropenem (MERREM) 500 mg in sodium chloride 0.9 % 50 mL IVPB  Status:  Discontinued     500 mg 100 mL/hr over 30 Minutes Intravenous Daily-1800 02/17/17 1026 02/25/17 0814   02/17/17 1200  vancomycin (VANCOCIN) IVPB 1000 mg/200 mL premix     1,000 mg 200 mL/hr over 60 Minutes Intravenous Every T-Th-Sa (Hemodialysis) 02/14/17 1758 02/19/17 1612   02/14/17 2200  clindamycin (CLEOCIN) IVPB 900 mg  Status:  Discontinued     900 mg 100 mL/hr over 30 Minutes Intravenous Every 8 hours 02/14/17 1106 02/14/17 1928   02/14/17 2000  clindamycin (CLEOCIN) IVPB 900 mg  Status:  Discontinued     900 mg 100 mL/hr over 30 Minutes Intravenous Every 8 hours 02/14/17 1928 02/16/17 1047   02/14/17 1915  piperacillin-tazobactam (ZOSYN) IVPB 3.375 g  Status:  Discontinued     3.375  g 100 mL/hr over 30 Minutes Intravenous Every 12 hours 02/14/17 1801 02/17/17 1026   02/14/17 1845  vancomycin (VANCOCIN) 2,000 mg in sodium chloride 0.9 % 500 mL IVPB     2,000 mg 250 mL/hr over 120 Minutes Intravenous  Once 02/14/17 1758 02/14/17 2107   02/14/17 1115  clindamycin (CLEOCIN) IVPB 900 mg  Status:  Discontinued     900 mg 100 mL/hr over 30 Minutes Intravenous  Once 02/14/17 1106 02/14/17 2236   02/14/17 0945  clindamycin (CLEOCIN) IVPB 600 mg     600 mg 100 mL/hr over 30 Minutes Intravenous  Once 02/14/17 0931 02/14/17 1013        Objective:   Vitals:   03/22/17 0357 03/22/17 0430 03/22/17 0500 03/22/17 0730  BP:  (!) 88/66 102/66   Pulse:    (!) 123  Resp:  20  (!) 24  Temp: 100.1 F (37.8 C)   99.4 F (37.4 C)  TempSrc: Axillary   Axillary  SpO2:  98%  97%  Weight: 80 kg (176 lb 5.9 oz)     Height:        Wt Readings from Last 3 Encounters:  03/22/17 80 kg (176 lb 5.9 oz)  02/12/17 110.2 kg (243 lb)  01/28/17 110.2 kg (243 lb)     Intake/Output Summary (Last 24 hours) at 03/22/17 0741 Last data filed at 03/22/17 0600  Gross per 24 hour  Intake              165 ml  Output             -558 ml  Net              723 ml     Physical Exam  Awake Alert, Oriented X 3, No new F.N deficits, Normal affect [place].AT,PERRAL Supple Neck,No JVD, No cervical lymphadenopathy appriciated.  Symmetrical Chest wall movement, Good air movement bilaterally, CTAB Tachy s1, s2, ,No Gallops,Rubs or new Murmurs, No Parasternal Heave +ve B.Sounds, Abd Soft, No tenderness, No organomegaly appriciated, No rebound - guarding or rigidity. No Cyanosis, Clubbing or edema, No new Rash or bruise   Flexiseal in place Wound vac in place L AKA     Data Review:    CBC  Recent Labs Lab 03/16/17 0444 03/17/17 0405 03/19/17 0504 03/21/17 1430 03/22/17 0501  WBC 23.3* 19.4* 18.6* 19.4* 23.8*  HGB 7.6* 7.6* 8.2* 8.8* 8.7*  HCT 25.4* 26.1* 28.5* 30.7* 30.8*  PLT 371 402*  438* 555* 483*  MCV 95.5 95.3 99.0 99.0 100.0  MCH 28.6 27.7 28.5 28.4 28.2  MCHC 29.9* 29.1* 28.8* 28.7* 28.2*  RDW 17.6* 17.1* 17.5* 17.6* 17.6*    Chemistries   Recent Labs Lab 03/17/17 0405 03/19/17 0504 03/21/17 1430 03/22/17 0501  NA 136 140 143 144  K 3.1* 3.8 4.2 4.5  CL 97* 99* 99* 101  CO2 _0 GLUCOSE 175* 201* 206* 240*  BUN 95* 83* 73* 89*  CREATININE 5.81* 4.71* 4.99* 5.81*  CALCIUM 8.7* 8.9 9.4 9.4  MG  --  2.2  --   --   AST 21 24  --  26  ALT 6* 7*  --  13*  ALKPHOS 227* 288*  --  326*  BILITOT 1.5* 1.3*  --  1.5*   ------------------------------------------------------------------------------------------------------------------ No results for input(s): CHOL, HDL, LDLCALC, TRIG, CHOLHDL, LDLDIRECT in the last 72 hours.  Lab Results  Component Value Date   HGBA1C 5.2 10/08/2016   ------------------------------------------------------------------------------------------------------------------ No results for input(s): TSH, T4TOTAL, T3FREE, THYROIDAB in the last 72 hours.  Invalid input(s): FREET3 ------------------------------------------------------------------------------------------------------------------ No results for input(s): VITAMINB12, FOLATE, FERRITIN, TIBC, IRON, RETICCTPCT in the last 72 hours.  Coagulation profile No results for input(s): INR, PROTIME in the last 168 hours.  No results for input(s): DDIMER in the last 72 hours.  Cardiac Enzymes  Recent Labs Lab 03/21/17 1855  TROPONINI 0.04*   ------------------------------------------------------------------------------------------------------------------ No results found for: BNP  Inpatient Medications  Scheduled Meds: . calcium carbonate (dosed in mg elemental calcium)  250 mg of elemental calcium Oral TID  . carvedilol  12.5 mg Oral BID WC  . chlorhexidine gluconate (MEDLINE KIT)  15 mL Mouth Rinse BID  . darbepoetin (ARANESP) injection - DIALYSIS  200 mcg  Intravenous Q Tue-HD  . feeding supplement (NEPRO CARB STEADY)  1,000 mL Per Tube Q24H  . feeding supplement (PRO-STAT SUGAR FREE 64)  60 mL Per Tube QID  . [name]'s butt cream   Topical  QID  . heparin  5,000 Units Subcutaneous Q8H  . insulin aspart  0-15 Units Subcutaneous Q4H  . mouth rinse  15 mL Mouth Rinse QID  . multivitamin  1 tablet Oral QHS  . pantoprazole sodium  40 mg Per Tube Q24H  . potassium & sodium phosphates  1 packet Per Tube TID WC & HS  . saccharomyces boulardii  250 mg Oral BID  . sodium chloride flush  10-40 mL Intracatheter Q12H   Continuous Infusions: . sodium chloride 10 mL/hr at 03/16/17 1634  . sodium chloride    . sodium chloride    . sodium chloride    . sodium chloride    . sodium chloride    . sodium chloride     PRN Meds:.sodium chloride, sodium chloride, sodium chloride, sodium chloride, sodium chloride, sodium chloride, acetaminophen (TYLENOL) oral liquid 160 mg/5 mL, acetaminophen **OR** acetaminophen, albuterol, alteplase, fentaNYL (SUBLIMAZE) injection, heparin, heparin, HYDROmorphone (DILAUDID) injection, lidocaine (PF), lidocaine-prilocaine, metoprolol tartrate, ondansetron **OR** ondansetron (ZOFRAN) IV, pentafluoroprop-tetrafluoroeth, sodium chloride flush  Micro Results Recent Results (from the past 240 hour(s))  C difficile quick scan w PCR reflex     Status: None   Collection Time: 03/19/17  9:00 AM  Result Value Ref Range Status   C Diff antigen NEGATIVE NEGATIVE Final   C Diff toxin NEGATIVE NEGATIVE Final   C Diff interpretation No C. difficile detected.  Final    Radiology Reports Ct Abdomen Wo Contrast  Result Date: 03/02/2017 CLINICAL DATA:  Preop for gastrostomy to EXAM: CT ABDOMEN WITHOUT CONTRAST TECHNIQUE: Multidetector CT imaging of the abdomen was performed following the standard protocol without IV contrast. COMPARISON:  None. FINDINGS: Lower chest: Bibasilar dependent and medial consolidation. Large pericardial effusion.  Hepatobiliary: The left lobe of the liver is prominent and anterior to the stoma. The gallbladder is not clearly visualized. It may either be decompressed or absent due to cholecystectomy. Pancreas: Unremarkable Spleen: Unremarkable Adrenals/Urinary Tract: Kidneys and adrenal glands are within normal limits. Stomach/Bowel: The stomach is positioned deep to the liver and colon. Gastrostomy tube placement may be problematic. No evidence of small-bowel obstruction. Feeding tube tip is in the proximal duodenum. Vascular/Lymphatic: Small para-aortic lymph nodes. Atherosclerotic vascular calcifications are noted. No evidence of aortic aneurysm. Other: No free-fluid. Musculoskeletal: No vertebral compression deformity. IMPRESSION: The stomach is positioned deep to a prominent left lobe of the liver as well as the transverse colon. Gas is distension of the stomach and barium opacification of the transverse colon will be essential during the procedure. Bibasilar pulmonary consolidation. Large pericardial effusion. Electronically Signed   By: Marybelle Killings M.D.   On: 03/02/2017 07:18   Ir Fluoro Guide Cv Line Right  Result Date: 03/11/2017 INDICATION: Osteomyelitis EXAM: TUNNELED RIGHT JUGULAR PICC LINE PLACEMENT WITH ULTRASOUND AND FLUOROSCOPIC GUIDANCE MEDICATIONS: None. ANESTHESIA/SEDATION: None FLUOROSCOPY TIME:  Fluoroscopy Time:  minutes 30 seconds (1 mGy). COMPLICATIONS: None immediate. PROCEDURE: The patient was advised of the possible risks and complications and agreed to undergo the procedure. The patient was then brought to the angiographic suite for the procedure. The right neck was prepped with chlorhexidine, draped in the usual sterile fashion using maximum barrier technique (cap and mask, sterile gown, sterile gloves, large sterile sheet, hand hygiene and cutaneous antiseptic). Local anesthesia was attained by infiltration with 1% lidocaine. Ultrasound demonstrated patency of the right jugular vein, and this  was documented with an image. Under real-time ultrasound guidance, this vein was accessed with a 21 gauge micropuncture needle and image  documentation was performed. A long subcutaneous tract was employed. The needle was exchanged over a guidewire for a peel-away sheath through which a 26 cm 5 [place] double lumen power injectable PICC was advanced, and positioned with its tip at the lower SVC/right atrial junction. The cuff was positioned in the subcutaneous tract. Fluoroscopy during the procedure and fluoro spot radiograph confirms appropriate catheter position. The catheter was flushed, secured to the skin with Prolene sutures, and covered with a sterile dressing. IMPRESSION: Successful placement of a tunneled right jugular PICC with sonographic and fluoroscopic guidance. The catheter is ready for use. Electronically Signed   By: Marybelle Killings M.D.   On: 03/11/2017 15:31   Ir US Guide Vasc Access Right  Result Date: 03/11/2017 INDICATION: Osteomyelitis EXAM: TUNNELED RIGHT JUGULAR PICC LINE PLACEMENT WITH ULTRASOUND AND FLUOROSCOPIC GUIDANCE MEDICATIONS: None. ANESTHESIA/SEDATION: None FLUOROSCOPY TIME:  Fluoroscopy Time:  minutes 30 seconds (1 mGy). COMPLICATIONS: None immediate. PROCEDURE: The patient was advised of the possible risks and complications and agreed to undergo the procedure. The patient was then brought to the angiographic suite for the procedure. The right neck was prepped with chlorhexidine, draped in the usual sterile fashion using maximum barrier technique (cap and mask, sterile gown, sterile gloves, large sterile sheet, hand hygiene and cutaneous antiseptic). Local anesthesia was attained by infiltration with 1% lidocaine. Ultrasound demonstrated patency of the right jugular vein, and this was documented with an image. Under real-time ultrasound guidance, this vein was accessed with a 21 gauge micropuncture needle and image documentation was performed. A long subcutaneous tract was  employed. The needle was exchanged over a guidewire for a peel-away sheath through which a 26 cm 5 [place] double lumen power injectable PICC was advanced, and positioned with its tip at the lower SVC/right atrial junction. The cuff was positioned in the subcutaneous tract. Fluoroscopy during the procedure and fluoro spot radiograph confirms appropriate catheter position. The catheter was flushed, secured to the skin with Prolene sutures, and covered with a sterile dressing. IMPRESSION: Successful placement of a tunneled right jugular PICC with sonographic and fluoroscopic guidance. The catheter is ready for use. Electronically Signed   By: Marybelle Killings M.D.   On: 03/11/2017 15:31   Dg Chest Port 1 View  Result Date: 03/21/2017 CLINICAL DATA:  Fever. EXAM: PORTABLE CHEST 1 VIEW COMPARISON:  March 17, 2017 FINDINGS: A tracheostomy tube is stable. A feeding tube terminates below today's film. A right-sided line terminates in the right atrium, unchanged. Stable cardiomegaly. No pneumothorax. No pulmonary nodules, masses, or focal infiltrates. IMPRESSION: Stable support apparatus. Cardiomegaly without overt edema. No change. Electronically Signed   By: Dorise Bullion III M.D   On: 03/21/2017 14:56   Dg Chest Port 1 View  Result Date: 03/17/2017 CLINICAL DATA:  Shortness of Breath EXAM: PORTABLE CHEST 1 VIEW COMPARISON:  03/11/2017 FINDINGS: Cardiac shadow is enlarged but stable. Tracheostomy tube, feeding catheter and right-sided jugular central line are noted. The lungs are poorly aerated but no focal infiltrate is seen. Mild crowding of the vascular markings is noted related to the poor inspiratory effort. No bony abnormality is seen. IMPRESSION: Stable cardiomegaly. Poor inspiratory effort although no focal infiltrate is seen. Electronically Signed   By: Inez Catalina M.D.   On: 03/17/2017 13:48   Dg Chest Port 1 View  Result Date: 03/11/2017 CLINICAL DATA:  Leukocytosis. EXAM: PORTABLE CHEST 1 VIEW  COMPARISON:  03/03/2017 FINDINGS: Patient is rotated to the left. The cardio pericardial silhouette is enlarged. Slight improvement in  left base aeration. Tracheostomy tube remains in place. A feeding tube passes into the stomach although the distal tip position is not included on the film. The visualized bony structures of the thorax are intact. Telemetry leads overlie the chest. IMPRESSION: Persistent enlargement of the cardiopericardial silhouette without evidence for overt pulmonary edema or substantial pleural effusion. Electronically Signed   By: Misty Stanley M.D.   On: 03/11/2017 08:37   Dg Chest Port 1 View  Result Date: 03/03/2017 CLINICAL DATA:  Respiratory failure. EXAM: PORTABLE CHEST 1 VIEW COMPARISON:  03/01/2017 . FINDINGS: Tracheostomy tube and feeding tube in stable position. Cardiomegaly with mild diffuse interstitial prominence suggesting mild CHF. Persistent atelectasis and consolidation left lower lobe. Small left pleural effusion cannot be excluded. No pneumothorax. IMPRESSION: 1. Tracheostomy tube and feeding tube in stable position. 2. Cardiomegaly with diffuse mild interstitial prominence suggesting mild CHF. 3. Persistent left lower lobe atelectasis and consolidation. Small left pleural effusion cannot be excluded . Electronically Signed   By: Marcello Moores  Register   On: 03/03/2017 07:09   Dg Chest Port 1 View  Result Date: 03/01/2017 CLINICAL DATA:  Respiratory failure EXAM: PORTABLE CHEST 1 VIEW COMPARISON:  02/27/2017 FINDINGS: 0546 hours. Patient rotated to the left. Tracheostomy tube remains in place. A feeding tube passes into the stomach although the distal tip position is not included on the film. The cardio pericardial silhouette is enlarged. Left base collapse/consolidation again noted. There is pulmonary vascular congestion without overt pulmonary edema. IMPRESSION: Rotated film with cardiomegaly and vascular congestion. Persistent left base collapse/ consolidation.  Electronically Signed   By: Misty Stanley M.D.   On: 03/01/2017 07:43   Dg Chest Port 1 View  Result Date: 02/27/2017 CLINICAL DATA:  Hypoxia. EXAM: PORTABLE CHEST 1 VIEW COMPARISON:  02/26/2017. FINDINGS: Tracheostomy to in stable position. Stable cardiomegaly. Low lung volumes. Progressive left lower lobe atelectasis and infiltrate. Small left pleural effusion cannot be excluded . IMPRESSION: 1. Tracheostomy tube in stable position. 2. Progressive left lower lobe atelectasis and infiltrate. Small left pleural effusion cannot be excluded . Electronically Signed   By: Marcello Moores  Register   On: 02/27/2017 07:01   Dg Chest Port 1 View  Result Date: 02/26/2017 CLINICAL DATA:  Status post tracheostomy placement. EXAM: PORTABLE CHEST 1 VIEW COMPARISON:  Earlier today. FINDINGS: The endotracheal tube has been removed and replaced with a tracheostomy tube in satisfactory position. The nasogastric tube and esophageal probe have been removed. The left jugular catheter has been removed. No pneumothorax. Grossly stable enlarged cardiac silhouette. Left lower lobe opacity with improvement laterally since 02/25/2017. Clear right lung. Unremarkable bones. IMPRESSION: 1. Tracheostomy tube in satisfactory position. 2. Left lower lobe atelectasis or pneumonia with mild improvement since 2 days ago. 3. Stable cardiomegaly. Electronically Signed   By: Claudie Revering M.D.   On: 02/26/2017 16:02   Dg Chest Port 1 View  Result Date: 02/26/2017 CLINICAL DATA:  Hypoxia.  Shortness of breath. EXAM: PORTABLE CHEST 1 VIEW COMPARISON:  02/25/2017. FINDINGS: Endotracheal tube, NG tube, left IJ line esophageal probe in stable position. Cardiomegaly with bilateral pulmonary interstitial infiltrates consistent with CHF. Left base atelectasis . Small left pleural effusion. Similar findings noted on prior exam. No pneumothorax. IMPRESSION: 1. Lines and tubes in stable position. 2. Cardiomegaly with bilateral pulmonary interstitial prominence  and small left pleural effusion noted consistent CHF. Left base atelectasis. Similar findings noted on prior exam. Electronically Signed   By: [place]   On: 02/26/2017 07:18   Dg Chest  Port 1 View  Result Date: 02/25/2017 CLINICAL DATA:  Respiratory failure, intubated patient. Diabetes, cardiomyopathy, morbid obesity, end-stage renal disease. EXAM: PORTABLE CHEST 1 VIEW COMPARISON:  Portable chest x-ray of Feb 24, 2017 FINDINGS: The lungs are adequately inflated. The pulmonary interstitial markings are slightly less prominent today. The pulmonary vascularity remains engorged. The cardiac silhouette remains enlarged. Retrocardiac region on the left is slightly less dense. The endotracheal tube tip lies 5.7 cm above the carina. The esophagogastric tube tip projects below the inferior margin of the image. The left internal jugular venous catheter tip projects over the proximal SVC. IMPRESSION: Slight interval improvement in pulmonary interstitial edema. Stable cardiomegaly. Persistent left lower lobe atelectasis or pneumonia. The support tubes are in reasonable position. Electronically Signed   By: David  [place]ique M.D.   On: 02/25/2017 07:57   Dg Chest Port 1 View  Result Date: 02/24/2017 CLINICAL DATA:  Respiratory failure EXAM: PORTABLE CHEST 1 VIEW COMPARISON:  Two days ago FINDINGS: Endotracheal tube tip at the clavicular heads. An orogastric tube reaches the stomach. Esophageal thermistor. Left IJ central line with tip at the SVC level. Cardiomegaly and diffuse hazy opacity with cephalized blood flow. No definitive effusion. No pneumothorax. IMPRESSION: 1. Stable positioning of tubes and central line. 2. CHF pattern. Electronically Signed   By: Monte Fantasia M.D.   On: 02/24/2017 07:31   Dg Chest Port 1 View  Result Date: 02/22/2017 CLINICAL DATA:  Respiratory failure EXAM: PORTABLE CHEST 1 VIEW COMPARISON:  02/21/2017 FINDINGS: Endotracheal tube terminates 5 cm above the carina. Cardiomegaly  with mild interstitial edema. Retrocardiac opacity, possibly reflecting a combination of atelectasis and pleural effusion, pneumonia not excluded. No pneumothorax. Left IJ venous catheter terminates at the cavoatrial junction. Enteric tube courses into the stomach. IMPRESSION: Endotracheal tube terminates 5 cm above the carina. Cardiomegaly with mild interstitial edema. Retrocardiac opacity, possibly atelectasis and pleural effusion, pneumonia not excluded. Electronically Signed   By: Julian Hy M.D.   On: 02/22/2017 07:30   Dg Chest Port 1 View  Result Date: 02/21/2017 CLINICAL DATA:  Endotracheal tube. EXAM: PORTABLE CHEST 1 VIEW COMPARISON:  02/19/2017 FINDINGS: Endotracheal tube terminates approximately 5 cm above the carina. Enteric tube courses into the left upper abdomen with tip not imaged. Left jugular catheter terminates over the mid SVC. The cardiac silhouette remains enlarged. Pulmonary vascular congestion and bilateral parenchymal lung opacities have not significantly changed. No large pleural effusion or pneumothorax is identified. IMPRESSION: Unchanged pulmonary edema. Electronically Signed   By: Logan Bores M.D.   On: 02/21/2017 07:42   Dg Abd Portable 1v  Result Date: 03/02/2017 CLINICAL DATA:  Encounter for feeding tube placement EXAM: PORTABLE ABDOMEN - 1 VIEW COMPARISON:  Portable exam 1712 hours compared to CT abdomen and pelvis of 03/01/2017 FINDINGS: Feeding tube traverses abdomen with tip projecting over distal antrum near pylorus. Cardiac silhouette appears enlarged. Visualized bowel gas pattern normal. Osseous structures unremarkable. IMPRESSION: Tip of feeding tube projects over distal gastric antrum near pylorus. Electronically Signed   By: Lavonia Dana M.D.   On: 03/02/2017 17:21   Dg Abd Portable 1v  Result Date: 02/27/2017 CLINICAL DATA:  Feeding tube placement EXAM: PORTABLE ABDOMEN - 1 VIEW COMPARISON:  None. FINDINGS: Feeding tube with the tip projecting over the  antrum of the stomach. There is no bowel dilatation to suggest obstruction. There is no evidence of pneumoperitoneum, portal venous gas or pneumatosis. There are no pathologic calcifications along the expected course of the ureters. The osseous structures  are unremarkable. IMPRESSION: Feeding tube with the tip projecting over the antrum of the stomach. Electronically Signed   By: Kathreen Devoid   On: 02/27/2017 11:05   Dg Swallowing Func-speech Pathology  Result Date: 03/21/2017 Objective Swallowing Evaluation: Type of Study: MBS-Modified Barium Swallow Study Patient Details Name: [name] MRN: [id number] Date of Birth: [birth year] Today's Date: 03/21/2017 Time: SLP Start Time (ACUTE ONLY): 0905-SLP Stop Time (ACUTE ONLY): 0915 SLP Time Calculation (min) (ACUTE ONLY): 10 min Past Medical History: Past Medical History: Diagnosis Date . Anemia  . Arthritis   "left hand" (09/15/2013) . Asthma  . CHF (congestive heart failure) ([place])  . Chronic bronchitis ([place])   "just about q yr" (09/15/2013) . Chronic kidney disease   "low kidney function" (09/15/2013) ,  T/Th/Sa . COPD (chronic obstructive pulmonary disease) ([place])  . Coronary artery disease  . Hyperlipidemia  . Hypertension  . Migraine   "get them alot" (09/15/2013) . Myocardial infarction [facility]) 04/2015  NSTEMI . Normal coronary arteries   by cardiac catheterization 09/20/13 . Peripheral vascular disease ([place])  . Pneumonia   "couple times; have it now" (09/15/2013) . PONV (postoperative nausea and vomiting)  . Restless legs  . Shortness of breath   "just recently; related to the pneumonia" (09/15/2013) . Type 1 diabetes mellitus ([place])   type 2 Past Surgical History: Past Surgical History: Procedure Laterality Date . AMPUTATION Right 06/25/2016  Procedure: Amputation Right Great Toe at the Metatarsophalangeal Joint;  Surgeon: Newt Minion, MD;  Location: [place];  Service: Orthopedics;  Laterality: Right; . AMPUTATION Bilateral 10/08/2016  Procedure: Bilateral  Transmetatarsal Amputation;  Surgeon: Newt Minion, MD;  Location: [place];  Service: Orthopedics;  Laterality: Bilateral; . AMPUTATION Left 03/13/2017  Procedure: AMPUTATION ABOVE KNEE;  Surgeon: Newt Minion, MD;  Location: [place];  Service: Orthopedics;  Laterality: Left; . AV FISTULA PLACEMENT Left 03/27/2015  Procedure: CREATION RADIAL CEPHALIC ARTERIOVENOUS FISTULA;  Surgeon: Angelia Mould, MD;  Location: [place];  Service: Vascular;  Laterality: Left; . AV FISTULA PLACEMENT Left 11/23/2015  Procedure:  LEFT ARM BASILIC VEIN TRANSPOSITION  ;  Surgeon: Angelia Mould, MD;  Location: [place];  Service: Vascular;  Laterality: Left; . CESAREAN SECTION  1999; 2006 . CORONARY ANGIOGRAM  09/20/2013  Procedure: CORONARY ANGIOGRAM;  Surgeon: Lorretta Harp, MD;  Location: [facility] CATH LAB;  Service: Cardiovascular;; . FINGER SURGERY Left 1985  3rd and 4th digits reconstructed after cut off" (09/15/2013) . IR FLUORO GUIDE CV LINE RIGHT  03/11/2017 . IR US GUIDE VASC ACCESS RIGHT  03/11/2017 . PERIPHERAL VASCULAR CATHETERIZATION N/A 09/05/2016  Procedure: Abdominal Aortogram;  Surgeon: Elam Dutch, MD;  Location: [place] CV LAB;  Service: Cardiovascular;  Laterality: N/A; . PERIPHERAL VASCULAR CATHETERIZATION Bilateral 09/05/2016  Procedure: Lower Extremity Angiography;  Surgeon: Elam Dutch, MD;  Location: [place] CV LAB;  Service: Cardiovascular;  Laterality: Bilateral; . PERIPHERAL VASCULAR CATHETERIZATION Right 09/05/2016  Procedure: Peripheral Vascular Balloon Angioplasty;  Surgeon: Elam Dutch, MD;  Location: [place] CV LAB;  Service: Cardiovascular;  Laterality: Right;  peroneal and AT . SHOULDER ARTHROSCOPY WITH BICEPSTENOTOMY Right 05/10/2015  Procedure: RIGHT SHOULDER ARTHROSCOPY WITH BICEPS TENOTOMY, DEBRIDEMENT LABRAL TEAR;  Surgeon: Tania Ade, MD;  Location: [place];  Service: Orthopedics;  Laterality: Right;  Right shoulder arthroscopy biceps tenotomy, debridement labral tear .  STUMP REVISION Left 01/21/2017  Procedure: . Revision Left Transmetatarsal Amputation;  Surgeon: Newt Minion, MD;  Location: [place];  Service: Orthopedics;  Laterality: Left; . TONSILLECTOMY  1997 . 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 ETT 5/19 and trach'd 5/31. Hospital course included cardiac arrest, transfer to ICU No Data Recorded Assessment / Plan / Recommendation CHL IP CLINICAL IMPRESSIONS 03/21/2017 Clinical Impression Unfortunately pts arousal was a major barrier to success on this exam. Pt fully alert during bedside eval the preceeding day, and awake but drowsy on arrival to radiology. When she was transferred to the chair and positioned, pt could not appropriately sustain arousal without max cues. SLP was able to provide stimuli and tactile cueing for one bite of puree. Pt orally transited this eventually and triggered a weak, ineffective swallow leaving the majority of the bolus in pharynx. With max cues and painful stimuli SLP was able to cue pt to trigger two more swallows to clear bolus from pharynx. At that time the study was discontinued due to risk to pt and inability to progress further. This is likely not indicative of pts function and there is potential for ability to consume PO, but pt will need to be fully alert and participatory. Will follow for further attempts.  SLP Visit Diagnosis Dysphagia, unspecified (R13.10) Attention and concentration deficit following -- Frontal lobe and executive function deficit following -- Impact on safety and function Severe aspiration risk   CHL IP TREATMENT RECOMMENDATION 03/21/2017 Treatment Recommendations Therapy as outlined in treatment plan below    No flowsheet data found. CHL IP DIET RECOMMENDATION 03/21/2017 SLP Diet Recommendations NPO;Alternative means - temporary Liquid Administration via -- Medication Administration -- Compensations -- Postural Changes --   No flowsheet data found.  CHL IP FOLLOW UP RECOMMENDATIONS 03/21/2017 Follow up Recommendations Skilled Nursing facility   [facility] IP FREQUENCY AND DURATION 03/21/2017 Speech Therapy Frequency (ACUTE ONLY) min 2x/week Treatment Duration 2 weeks      CHL IP ORAL PHASE 03/21/2017 Oral Phase Impaired Oral - Pudding Teaspoon -- Oral - Pudding Cup -- Oral - Honey Teaspoon -- Oral - Honey Cup -- Oral - Nectar Teaspoon -- Oral - Nectar Cup -- Oral - Nectar Straw -- Oral - Thin Teaspoon -- Oral - Thin Cup -- Oral - Thin Straw -- Oral - Puree Delayed oral transit;Reduced posterior propulsion Oral - Mech Soft -- Oral - Regular -- Oral - Multi-Consistency -- Oral - Pill -- Oral Phase - Comment --  CHL IP PHARYNGEAL PHASE 03/21/2017 Pharyngeal Phase Impaired Pharyngeal- Pudding Teaspoon -- Pharyngeal -- Pharyngeal- Pudding Cup -- Pharyngeal -- Pharyngeal- Honey Teaspoon -- Pharyngeal -- Pharyngeal- Honey Cup -- Pharyngeal -- Pharyngeal- Nectar Teaspoon -- Pharyngeal -- Pharyngeal- Nectar Cup -- Pharyngeal -- Pharyngeal- Nectar Straw -- Pharyngeal -- Pharyngeal- Thin Teaspoon -- Pharyngeal -- Pharyngeal- Thin Cup -- Pharyngeal -- Pharyngeal- Thin Straw -- Pharyngeal -- Pharyngeal- Puree Reduced laryngeal elevation;Reduced tongue base retraction;Reduced pharyngeal peristalsis;Reduced epiglottic inversion;Delayed swallow initiation-vallecula;Pharyngeal residue - valleculae;Pharyngeal residue - pyriform Pharyngeal -- Pharyngeal- Mechanical Soft -- Pharyngeal -- Pharyngeal- Regular -- Pharyngeal -- Pharyngeal- Multi-consistency -- Pharyngeal -- Pharyngeal- Pill -- Pharyngeal -- Pharyngeal Comment --  No flowsheet data found. No flowsheet data found. [place], Katherene Ponto 03/21/2017, 9:55 AM  Time Spent in  minutes  30   Jani Gravel M.D on 03/22/2017 at 7:41 AM  Between 7am to 7pm - Pager - [phone]  After 7pm go to [email or web address] - password [facility]  [facility] -  Office  [phone]

## 2017-03-23 LAB — COMPREHENSIVE METABOLIC PANEL
ALBUMIN: 2.7 g/dL — AB (ref 3.5–5.0)
ALT: 13 U/L — ABNORMAL LOW (ref 14–54)
ANION GAP: 18 — AB (ref 5–15)
AST: 27 U/L (ref 15–41)
Alkaline Phosphatase: 298 U/L — ABNORMAL HIGH (ref 38–126)
BILIRUBIN TOTAL: 1.4 mg/dL — AB (ref 0.3–1.2)
BUN: 137 mg/dL — AB (ref 6–20)
CO2: 26 mmol/L (ref 22–32)
Calcium: 9.4 mg/dL (ref 8.9–10.3)
Chloride: 100 mmol/L — ABNORMAL LOW (ref 101–111)
Creatinine, Ser: 7.3 mg/dL — ABNORMAL HIGH (ref 0.44–1.00)
GFR calc Af Amer: 7 mL/min — ABNORMAL LOW (ref >60)
GFR calc non Af Amer: 6 mL/min — ABNORMAL LOW (ref >60)
GLUCOSE: 196 mg/dL — AB (ref 65–99)
POTASSIUM: 4.3 mmol/L (ref 3.5–5.1)
SODIUM: 144 mmol/L (ref 135–145)
Total Protein: 7.2 g/dL (ref 6.5–8.1)

## 2017-03-23 LAB — GLUCOSE, CAPILLARY
GLUCOSE-CAPILLARY: 190 mg/dL — AB (ref 65–99)
Glucose-Capillary: 128 mg/dL — ABNORMAL HIGH (ref 65–99)
Glucose-Capillary: 143 mg/dL — ABNORMAL HIGH (ref 65–99)
Glucose-Capillary: 155 mg/dL — ABNORMAL HIGH (ref 65–99)
Glucose-Capillary: 167 mg/dL — ABNORMAL HIGH (ref 65–99)
Glucose-Capillary: 183 mg/dL — ABNORMAL HIGH (ref 65–99)
Glucose-Capillary: 234 mg/dL — ABNORMAL HIGH (ref 65–99)

## 2017-03-23 LAB — CBC
HEMATOCRIT: 28.8 % — AB (ref 36.0–46.0)
HEMOGLOBIN: 8.3 g/dL — AB (ref 12.0–15.0)
MCH: 28.7 pg (ref 26.0–34.0)
MCHC: 28.8 g/dL — AB (ref 30.0–36.0)
MCV: 99.7 fL (ref 78.0–100.0)
Platelets: 486 10*3/uL — ABNORMAL HIGH (ref 150–400)
RBC: 2.89 MIL/uL — ABNORMAL LOW (ref 3.87–5.11)
RDW: 17.2 % — AB (ref 11.5–15.5)
WBC: 20.5 10*3/uL — AB (ref 4.0–10.5)

## 2017-03-23 MED ORDER — ALBUMIN HUMAN 25 % IV SOLN
12.5000 g | Freq: Once | INTRAVENOUS | Status: AC
  Administered 2017-03-23: 12.5 g via INTRAVENOUS

## 2017-03-23 MED ORDER — ALBUMIN HUMAN 5 % IV SOLN
INTRAVENOUS | Status: AC
  Filled 2017-03-23: qty 500

## 2017-03-23 MED ORDER — ALBUMIN HUMAN 25 % IV SOLN
25.0000 g | Freq: Once | INTRAVENOUS | Status: AC
  Administered 2017-03-23: 25 g via INTRAVENOUS
  Filled 2017-03-23: qty 50

## 2017-03-23 MED ORDER — HEPARIN SODIUM (PORCINE) 1000 UNIT/ML DIALYSIS
20.0000 [IU]/kg | INTRAMUSCULAR | Status: DC | PRN
  Filled 2017-03-23: qty 2

## 2017-03-23 MED ORDER — ALBUMIN HUMAN 25 % IV SOLN
INTRAVENOUS | Status: AC
  Administered 2017-03-23: 12.5 g via INTRAVENOUS
  Filled 2017-03-23: qty 100

## 2017-03-23 NOTE — Progress Notes (Addendum)
PROGRESS NOTE        PATIENT DETAILS Name: [name] Age: 36 y.o. Sex: female Date of Birth: 22-Nov-1980 Admit Date: 02/14/2017 Admitting Physician Lady Deutscher, MD VDI:[id number], Helene Kelp, FNP  Note-care assumed by me on 6/18  Brief Narrative: Patient is a 36 y.o. female with history of ESRD on HD, poorly controlled diabetes, history of bilateral transmetatarsal foot amputation with subsequent left foot wound dehiscence (refused BKA as outpatient), chronic systolic heart failure felt to be secondary to nonischemic cardiomyopathy admitted on 02/14/17 with wound dehiscence and persistent drainage from the left trans-metatarsal amputation site, she was admitted to the [facility]. She was subsequently found to be unresponsive in her room on 5/19 after getting IV Dilaudid-she was  in asystole, CPR was started, with ROSC in around 9 minutes.She was then transferred to the intensive care unit, and subsequently underwent cooling and then rewarming. She was unable to be liberated off the ventilator, and as a result underwent tracheostomy on 5/31. Patient has been evaluated by neurology during this hospital stay, per neurology chances of meaningful neurological recovery is dismal. Hospital course has now been complicated by persistent leukocytosis, respiratory failure, and wound dehiscence with gangrene causing persistent SIRS pathophysiology. 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 On ATC overnight .  PCCM following for resp issues -tracheostomy in place. Change to #4 cuffless trach .  Marland Kitchen S/P MBS 6/23, recommendation is NPO as the patient was too lethargic . FEES is  recommended.  Holding off on PEG for now.  Patient pulled out her CoRTRAK  tube few days ago and it was replaced     Diarrhea, continues to have loose stools Continue probiotics  Tachycardia-sinus tachycardia, occasional SVT, likely secondary to low-grade fever, low blood pressure, not sure if the patient will be a candidate for long-term hemodialysis  Cardiac arrest on 5/19 with anoxic brain injury:  She seems to be slowly improving, following all major commands. Evaluated by neurology on 5/29-felt to have poor overall prognoses for any meaningful recovery.     Systemic inflammatory response syndrome:/Persistent leukocytosis Continues to be intermittently febrile (low-grade), tachycardic-and has persistent leukocytosis white count slightly improved from 23.3-> 18.6 >20.5  . Likely secondary to left foot gangrene/infection and abscess-she is now status post AKA and still continues to have these symptoms. Blood cultures on 6/1, 6/13 continue to be negative. The repeat blood cultures 6/25 . Chest x-ray on 6/13 negative for pneumonia. Patient underwent CT chest on 6/24, which was negative for pneumonia, PE. Ruled out for C. difficile 2,   received multiple courses of antibiotics between 5/19-6/20   She was on vancomycin and Tressie Ellis that was started on 6/14, and subsequently discontinued on 6/16 after AKA. Spoke with Dr Linus Salmons- who has evaluated this patient in the past-he recommends  vancomycin and Fortaz through 6/20, now completed .        Left trans-metatarsal stump wound dehiscence with infection and gangrene: Per operative note-pus/abscess noted extending up to the knee when BKA was attempted, subsequently underwent a AKA. Note, initially family was very reluctant to proceed with amputation, but given persistent leukocytosis/fever and  potential risk of sepsis, family agreed for amputation, subsequently AKA was done on 6/15.  Orthopedics removed wound VAC   ESRD: Nephrology following-HD on TTS.  Last hemodialysis 6/21 , HD terminated 6/23 due to tachycardia and hypotension  Anemia: Hemoglobin 7.5-9.0  Likely secondary to chronic disease-probably worsened by acute illness. No signs of bleeding, hemoglobin has been stable around 8.2-8.5  DM-2: CBGs stable-continue with SSI.   Chronic systolic heart failure/nonischemic cardiomyopathy (EF 30-35% by TEE on 6/4): Volume status remained stable-this is managed with dialysis-continue with Coreg.   Pericardial effusion: Seen on TEE-with no evidence of tamponade pathophysiology. Repeat echo at some point in the next few weeks.  Moderate protein calorie malnutrition: Continue NG tube feedings  Hypokalemia-stable  Goals of care:  Prognosis is extremely guarded, need to make a decision about continuing hemodialysis   Telemetry (independently reviewed): Sinus tachycardia  Studies: Lower extremity ultrasound 5/18 >> no evidence of DVT CT head 5/19 >> normal exam Echo 5/20 >> EF 30-35%, increased LVF. Diffuse hypokinesis, akinesis of basilar mid-inferior myocardium, grade 2 diastolic dysfxn  EEG 1/61 >> finding c/w mod to severe global cerebral dysfxn. C/w anoxic injury given clinical course  LE [place] 5/21 >> negative MRI brain 5/24 >> ?thrombosed cortical vein, otherwise normal TEE 6/4 >> mild MR, normal AV, mild TR, mild PR, EF 30-35%, diffuse hypokinesis, no thrombus, no PFO, normal RV, moderate pericardial effusion with synechia suggesting some chronicity, no vegetations   Echo (reviewed):EF 30-35% on TEE done on 6/4  Morning labs/Imaging ordered: yes  DVT Prophylaxis: Prophylactic Heparin   Code Status: Full code   Family Communication: Mother at bedside, discussed on a daily basis  Disposition Plan: Remain inpatient in SDU  Antimicrobial agents: Anti-infectives    Start     Dose/Rate Route Frequency Ordered Stop   03/17/17 1800  cefTAZidime (FORTAZ) 2 g in dextrose 5 % 50 mL IVPB  Status:  Discontinued     2 g 100 mL/hr  over 30 Minutes Intravenous Every T-Th-Sa (1800) 03/15/17 1143 03/18/17 1223   03/17/17 1200  vancomycin (VANCOCIN) IVPB 1000 mg/200 mL premix  Status:  Discontinued     1,000 mg 200 mL/hr over 60 Minutes Intravenous Every T-Th-Sa (Hemodialysis) 03/15/17 1143 03/18/17 1223   03/14/17 1200  vancomycin (VANCOCIN) IVPB 1000 mg/200 mL premix  Status:  Discontinued     1,000 mg 200 mL/hr over 60 Minutes Intravenous Every T-Th-Sa (Hemodialysis) 03/12/17 0912 03/14/17 1542   03/13/17 1430  ceFAZolin (ANCEF) IVPB 2g/100 mL premix  Status:  Discontinued     2 g 200 mL/hr over 30 Minutes Intravenous To ShortStay Surgical 03/12/17 1208 03/13/17 1113   03/13/17 1115  ceFAZolin (ANCEF) IVPB 2g/100 mL premix     2 g 200 mL/hr over 30 Minutes Intravenous To Surgery 03/13/17 1108 03/13/17 1506   03/12/17 1800  cefTAZidime (FORTAZ) 2 g in dextrose 5 % 50 mL IVPB  Status:  Discontinued     2 g 100 mL/hr over 30 Minutes Intravenous Every T-Th-Sa (1800) 03/12/17 0912 03/14/17 1542   03/12/17 1200  vancomycin (VANCOCIN) 1,500 mg in sodium chloride 0.9 % 250 mL IVPB     1,500 mg 250 mL/hr over 60 Minutes Intravenous Every Thu (Hemodialysis) 03/12/17 0912 03/12/17 1356   03/02/17 1200  cefTRIAXone (ROCEPHIN) 2 g in dextrose 5 % 50 mL IVPB     2 g 100 mL/hr over 30 Minutes Intravenous Every 24 hours 03/02/17 0810 03/06/17 1358   03/02/17 1200  vancomycin (VANCOCIN)  IVPB 1000 mg/200 mL premix     1,000 mg 200 mL/hr over 60 Minutes Intravenous Every M-W-F (Hemodialysis) 03/02/17 0815 03/02/17 1457   03/02/17 1200  metroNIDAZOLE (FLAGYL) IVPB 500 mg     500 mg 100 mL/hr over 60 Minutes Intravenous Every 8 hours 03/02/17 0948 03/06/17 2230   02/26/17 1200  vancomycin (VANCOCIN) IVPB 1000 mg/200 mL premix     1,000 mg 200 mL/hr over 60 Minutes Intravenous Every T-Th-Sa (Hemodialysis) 02/25/17 1151 03/05/17 1325   02/25/17 1200  vancomycin (VANCOCIN) 2,000 mg in sodium chloride 0.9 % 500 mL IVPB     2,000 mg 250  mL/hr over 120 Minutes Intravenous  Once 02/25/17 1149 02/25/17 1449   02/25/17 0900  cefTRIAXone (ROCEPHIN) 2 g in dextrose 5 % 50 mL IVPB  Status:  Discontinued     2 g 100 mL/hr over 30 Minutes Intravenous Every 24 hours 02/25/17 0814 03/02/17 0810   02/25/17 0900  metroNIDAZOLE (FLAGYL) IVPB 500 mg  Status:  Discontinued     500 mg 100 mL/hr over 60 Minutes Intravenous Every 8 hours 02/25/17 0814 03/02/17 0948   02/20/17 1200  vancomycin (VANCOCIN) IVPB 1000 mg/200 mL premix  Status:  Discontinued     1,000 mg 200 mL/hr over 60 Minutes Intravenous Every M-W-F (Hemodialysis) 02/19/17 1506 02/20/17 1403   02/17/17 1800  meropenem (MERREM) 500 mg in sodium chloride 0.9 % 50 mL IVPB  Status:  Discontinued     500 mg 100 mL/hr over 30 Minutes Intravenous Daily-1800 02/17/17 1026 02/25/17 0814   02/17/17 1200  vancomycin (VANCOCIN) IVPB 1000 mg/200 mL premix     1,000 mg 200 mL/hr over 60 Minutes Intravenous Every T-Th-Sa (Hemodialysis) 02/14/17 1758 02/19/17 1612   02/14/17 2200  clindamycin (CLEOCIN) IVPB 900 mg  Status:  Discontinued     900 mg 100 mL/hr over 30 Minutes Intravenous Every 8 hours 02/14/17 1106 02/14/17 1928   02/14/17 2000  clindamycin (CLEOCIN) IVPB 900 mg  Status:  Discontinued     900 mg 100 mL/hr over 30 Minutes Intravenous Every 8 hours 02/14/17 1928 02/16/17 1047   02/14/17 1915  piperacillin-tazobactam (ZOSYN) IVPB 3.375 g  Status:  Discontinued     3.375 g 100 mL/hr over 30 Minutes Intravenous Every 12 hours 02/14/17 1801 02/17/17 1026   02/14/17 1845  vancomycin (VANCOCIN) 2,000 mg in sodium chloride 0.9 % 500 mL IVPB     2,000 mg 250 mL/hr over 120 Minutes Intravenous  Once 02/14/17 1758 02/14/17 2107   02/14/17 1115  clindamycin (CLEOCIN) IVPB 900 mg  Status:  Discontinued     900 mg 100 mL/hr over 30 Minutes Intravenous  Once 02/14/17 1106 02/14/17 2236   02/14/17 0945  clindamycin (CLEOCIN) IVPB 600 mg     600 mg 100 mL/hr over 30 Minutes Intravenous   Once 02/14/17 0931 02/14/17 1013      Procedures: ETT 5/19 >> 5/31 Lt IJ CVL 5/19 >> out Trach 5/31 >>  TEE 6/4 >> mild MR, normal AV, mild TR, mild PR, EF 30-35%, diffuse hypokinesis, no thrombus, no PFO, normal RV, moderate pericardial effusion with synechia suggesting some chronicity, no vegetations  Rt IJ TLC 6/13>> Left AKA 6/15>>  CONSULTS:  Infectious disease:  Neurology  Gastroenterology  Wound care  PCCM  Nephrology  Orthopedics  Time spent: 30 minutes-Greater than 50% of this time was spent in counseling, explanation of diagnosis, planning of further management, and coordination of care.  MEDICATIONS: Scheduled Meds: . calcium carbonate (  dosed in mg elemental calcium)  250 mg of elemental calcium Oral TID  . carvedilol  12.5 mg Oral BID WC  . chlorhexidine gluconate (MEDLINE KIT)  15 mL Mouth Rinse BID  . darbepoetin (ARANESP) injection - DIALYSIS  200 mcg Intravenous Q Tue-HD  . feeding supplement (NEPRO CARB STEADY)  1,000 mL Per Tube Q24H  . feeding supplement (PRO-STAT SUGAR FREE 64)  60 mL Per Tube QID  . [name]'s butt cream   Topical QID  . heparin  5,000 Units Subcutaneous Q8H  . insulin aspart  0-15 Units Subcutaneous Q4H  . mouth rinse  15 mL Mouth Rinse QID  . multivitamin  1 tablet Oral QHS  . pantoprazole sodium  40 mg Per Tube Q24H  . saccharomyces boulardii  250 mg Oral BID  . sodium chloride flush  10-40 mL Intracatheter Q12H   Continuous Infusions: . sodium chloride 10 mL/hr at 03/16/17 1634  . sodium chloride    . sodium chloride    . sodium chloride    . sodium chloride    . sodium chloride    . sodium chloride     PRN Meds:.sodium chloride, sodium chloride, sodium chloride, sodium chloride, sodium chloride, sodium chloride, acetaminophen (TYLENOL) oral liquid 160 mg/5 mL, acetaminophen **OR** acetaminophen, albuterol, alteplase, fentaNYL (SUBLIMAZE) injection, heparin, heparin, heparin, HYDROmorphone (DILAUDID) injection,  lidocaine (PF), lidocaine-prilocaine, metoprolol tartrate, ondansetron **OR** ondansetron (ZOFRAN) IV, pentafluoroprop-tetrafluoroeth, sodium chloride flush   PHYSICAL EXAM: Vital signs: Vitals:   03/23/17 1115 03/23/17 1130 03/23/17 1145 03/23/17 1201  BP: 99/75 91/77 106/73 107/65  Pulse: (!) 112 (!) 115 (!) 124 (!) 123  Resp: (!) 27 (!) 32 (!) 27 (!) 28  Temp:      TempSrc:      SpO2:      Weight:      Height:       Filed Weights   03/22/17 0357 03/23/17 0254 03/23/17 0800  Weight: 80 kg (176 lb 5.9 oz) 81 kg (178 lb 9.2 oz) 80 kg (176 lb 5.9 oz)   Body mass index is 26.43 kg/m.  General appearance:Sleeping when I walked in-easily arouses, squeeze my hands. Eyes: No scleral icterus HEENT: Atraumatic and Normocephalic Neck: supple, tracheostomy in place Resp:Good air entry bilaterally, some transmitted upper airway sounds CVS: S1 S2 regular, tachycardic GI: Bowel sounds present, Non tender and not distended with no gaurding, rigidity or rebound. Extremities: Left AKA stump-vac in place. Musculoskeletal:No digital cyanosis  I have personally reviewed following labs and imaging studies  LABORATORY DATA: CBC:  Recent Labs Lab 03/17/17 0405 03/19/17 0504 03/21/17 1430 03/22/17 0501 03/23/17 0303  WBC 19.4* 18.6* 19.4* 23.8* 20.5*  HGB 7.6* 8.2* 8.8* 8.7* 8.3*  HCT 26.1* 28.5* 30.7* 30.8* 28.8*  MCV 95.3 99.0 99.0 100.0 99.7  PLT 402* 438* 555* 483* 486*    Basic Metabolic Panel:  Recent Labs Lab 03/17/17 0405 03/19/17 0504 03/21/17 1430 03/22/17 0501 03/23/17 0303  NA 136 140 143 144 144  K 3.1* 3.8 4.2 4.5 4.3  CL 97* 99* 99* 101 100*  CO2 24 24 26 27 26   GLUCOSE 175* 201* 206* 240* 196*  BUN 95* 83* 73* 89* 137*  CREATININE 5.81* 4.71* 4.99* 5.81* 7.30*  CALCIUM 8.7* 8.9 9.4 9.4 9.4  MG  --  2.2  --   --   --   PHOS  --   --  5.6*  --   --     GFR: Estimated Creatinine Clearance: 11.8 mL/min (A) (  by C-G formula based on SCr of 7.3 mg/dL  (H)).  Liver Function Tests:  Recent Labs Lab 03/17/17 0405 03/19/17 0504 03/21/17 1430 03/22/17 0501 03/23/17 0303  AST 21 24  --  26 27  ALT 6* 7*  --  13* 13*  ALKPHOS 227* 288*  --  326* 298*  BILITOT 1.5* 1.3*  --  1.5* 1.4*  PROT 6.3* 6.9  --  7.4 7.2  ALBUMIN 2.1* 2.3* 2.8* 2.8* 2.7*   No results for input(s): LIPASE, AMYLASE in the last 168 hours. No results for input(s): AMMONIA in the last 168 hours.  Coagulation Profile: No results for input(s): INR, PROTIME in the last 168 hours.  Cardiac Enzymes:  Recent Labs Lab 03/21/17 1855  TROPONINI 0.04*    BNP (last 3 results) No results for input(s): PROBNP in the last 8760 hours.  HbA1C: No results for input(s): HGBA1C in the last 72 hours.  CBG:  Recent Labs Lab 03/22/17 2105 03/22/17 2352 03/23/17 0352 03/23/17 0758 03/23/17 1156  GLUCAP 209* 234* 190* 167* 155*    Lipid Profile: No results for input(s): CHOL, HDL, LDLCALC, TRIG, CHOLHDL, LDLDIRECT in the last 72 hours.  Thyroid Function Tests: No results for input(s): TSH, T4TOTAL, FREET4, T3FREE, THYROIDAB in the last 72 hours.  Anemia Panel: No results for input(s): VITAMINB12, FOLATE, FERRITIN, TIBC, IRON, RETICCTPCT in the last 72 hours.  Urine analysis:    Component Value Date/Time   COLORURINE AMBER (A) 02/14/2017 2111   APPEARANCEUR HAZY (A) 02/14/2017 2111   LABSPEC 1.015 02/14/2017 2111   PHURINE 5.0 02/14/2017 2111   GLUCOSEU NEGATIVE 02/14/2017 2111   HGBUR SMALL (A) 02/14/2017 2111   BILIRUBINUR NEGATIVE 02/14/2017 2111   KETONESUR NEGATIVE 02/14/2017 2111   PROTEINUR 30 (A) 02/14/2017 2111   UROBILINOGEN 0.2 09/21/2014 1908   NITRITE NEGATIVE 02/14/2017 2111   LEUKOCYTESUR TRACE (A) 02/14/2017 2111    Sepsis Labs: Lactic Acid, Venous    Component Value Date/Time   LATICACIDVEN 4.6 ([place]) 02/14/2017 1807    MICROBIOLOGY: Recent Results (from the past 240 hour(s))  C difficile quick scan w PCR reflex     Status:  None   Collection Time: 03/19/17  9:00 AM  Result Value Ref Range Status   C Diff antigen NEGATIVE NEGATIVE Final   C Diff toxin NEGATIVE NEGATIVE Final   C Diff interpretation No C. difficile detected.  Final    RADIOLOGY STUDIES/RESULTS: Ct Abdomen Wo Contrast  Result Date: 03/02/2017 CLINICAL DATA:  Preop for gastrostomy to EXAM: CT ABDOMEN WITHOUT CONTRAST TECHNIQUE: Multidetector CT imaging of the abdomen was performed following the standard protocol without IV contrast. COMPARISON:  None. FINDINGS: Lower chest: Bibasilar dependent and medial consolidation. Large pericardial effusion. Hepatobiliary: The left lobe of the liver is prominent and anterior to the stoma. The gallbladder is not clearly visualized. It may either be decompressed or absent due to cholecystectomy. Pancreas: Unremarkable Spleen: Unremarkable Adrenals/Urinary Tract: Kidneys and adrenal glands are within normal limits. Stomach/Bowel: The stomach is positioned deep to the liver and colon. Gastrostomy tube placement may be problematic. No evidence of small-bowel obstruction. Feeding tube tip is in the proximal duodenum. Vascular/Lymphatic: Small para-aortic lymph nodes. Atherosclerotic vascular calcifications are noted. No evidence of aortic aneurysm. Other: No free-fluid. Musculoskeletal: No vertebral compression deformity. IMPRESSION: The stomach is positioned deep to a prominent left lobe of the liver as well as the transverse colon. Gas is distension of the stomach and barium opacification of the transverse colon will be essential  during the procedure. Bibasilar pulmonary consolidation. Large pericardial effusion. Electronically Signed   By: Marybelle Killings M.D.   On: 03/02/2017 07:18   Ct Angio Chest Pe W Or Wo Contrast  Result Date: 03/22/2017 CLINICAL DATA:  Tachycardia. EXAM: CT ANGIOGRAPHY CHEST WITH CONTRAST TECHNIQUE: Multidetector CT imaging of the chest was performed using the standard protocol during bolus  administration of intravenous contrast. Multiplanar CT image reconstructions and MIPs were obtained to evaluate the vascular anatomy. CONTRAST:  100 cc Isovue COMPARISON:  None. FINDINGS: Cardiovascular: No filling defects within the pulmonary arteries arteries to suggest acute pulmonary embolism. Mediastinum/Nodes: No axillary or supraclavicular adenopathy. No mediastinal adenopathy. Tracheostomy tube extends the mid trachea. Feeding tube extends the stomach esophagus. Lungs/Pleura: Mild basilar atelectasis. No pleural effusion. No infiltrate or overt pulmonary edema. Upper Abdomen: Limited view of the liver, kidneys, pancreas are unremarkable. Normal adrenal glands. Musculoskeletal: No aggressive osseous lesion. Review of the MIP images confirms the above findings. IMPRESSION: 1. No pulmonary embolism. 2. Bibasilar atelectasis. 3. Tracheostomy tube appears in good position Electronically Signed   By: Suzy Bouchard M.D.   On: 03/22/2017 13:52   Ir Fluoro Guide Cv Line Right  Result Date: 03/11/2017 INDICATION: Osteomyelitis EXAM: TUNNELED RIGHT JUGULAR PICC LINE PLACEMENT WITH ULTRASOUND AND FLUOROSCOPIC GUIDANCE MEDICATIONS: None. ANESTHESIA/SEDATION: None FLUOROSCOPY TIME:  Fluoroscopy Time:  minutes 30 seconds (1 mGy). COMPLICATIONS: None immediate. PROCEDURE: The patient was advised of the possible risks and complications and agreed to undergo the procedure. The patient was then brought to the angiographic suite for the procedure. The right neck was prepped with chlorhexidine, draped in the usual sterile fashion using maximum barrier technique (cap and mask, sterile gown, sterile gloves, large sterile sheet, hand hygiene and cutaneous antiseptic). Local anesthesia was attained by infiltration with 1% lidocaine. Ultrasound demonstrated patency of the right jugular vein, and this was documented with an image. Under real-time ultrasound guidance, this vein was accessed with a 21 gauge micropuncture needle  and image documentation was performed. A long subcutaneous tract was employed. The needle was exchanged over a guidewire for a peel-away sheath through which a 26 cm 5 [place] double lumen power injectable PICC was advanced, and positioned with its tip at the lower SVC/right atrial junction. The cuff was positioned in the subcutaneous tract. Fluoroscopy during the procedure and fluoro spot radiograph confirms appropriate catheter position. The catheter was flushed, secured to the skin with Prolene sutures, and covered with a sterile dressing. IMPRESSION: Successful placement of a tunneled right jugular PICC with sonographic and fluoroscopic guidance. The catheter is ready for use. Electronically Signed   By: Marybelle Killings M.D.   On: 03/11/2017 15:31   Ir US Guide Vasc Access Right  Result Date: 03/11/2017 INDICATION: Osteomyelitis EXAM: TUNNELED RIGHT JUGULAR PICC LINE PLACEMENT WITH ULTRASOUND AND FLUOROSCOPIC GUIDANCE MEDICATIONS: None. ANESTHESIA/SEDATION: None FLUOROSCOPY TIME:  Fluoroscopy Time:  minutes 30 seconds (1 mGy). COMPLICATIONS: None immediate. PROCEDURE: The patient was advised of the possible risks and complications and agreed to undergo the procedure. The patient was then brought to the angiographic suite for the procedure. The right neck was prepped with chlorhexidine, draped in the usual sterile fashion using maximum barrier technique (cap and mask, sterile gown, sterile gloves, large sterile sheet, hand hygiene and cutaneous antiseptic). Local anesthesia was attained by infiltration with 1% lidocaine. Ultrasound demonstrated patency of the right jugular vein, and this was documented with an image. Under real-time ultrasound guidance, this vein was accessed with a 21 gauge micropuncture needle and  image documentation was performed. A long subcutaneous tract was employed. The needle was exchanged over a guidewire for a peel-away sheath through which a 26 cm 5 [place] double lumen power injectable  PICC was advanced, and positioned with its tip at the lower SVC/right atrial junction. The cuff was positioned in the subcutaneous tract. Fluoroscopy during the procedure and fluoro spot radiograph confirms appropriate catheter position. The catheter was flushed, secured to the skin with Prolene sutures, and covered with a sterile dressing. IMPRESSION: Successful placement of a tunneled right jugular PICC with sonographic and fluoroscopic guidance. The catheter is ready for use. Electronically Signed   By: Marybelle Killings M.D.   On: 03/11/2017 15:31   Dg Chest Port 1 View  Result Date: 03/21/2017 CLINICAL DATA:  Fever. EXAM: PORTABLE CHEST 1 VIEW COMPARISON:  March 17, 2017 FINDINGS: A tracheostomy tube is stable. A feeding tube terminates below today's film. A right-sided line terminates in the right atrium, unchanged. Stable cardiomegaly. No pneumothorax. No pulmonary nodules, masses, or focal infiltrates. IMPRESSION: Stable support apparatus. Cardiomegaly without overt edema. No change. Electronically Signed   By: Dorise Bullion III M.D   On: 03/21/2017 14:56   Dg Chest Port 1 View  Result Date: 03/17/2017 CLINICAL DATA:  Shortness of Breath EXAM: PORTABLE CHEST 1 VIEW COMPARISON:  03/11/2017 FINDINGS: Cardiac shadow is enlarged but stable. Tracheostomy tube, feeding catheter and right-sided jugular central line are noted. The lungs are poorly aerated but no focal infiltrate is seen. Mild crowding of the vascular markings is noted related to the poor inspiratory effort. No bony abnormality is seen. IMPRESSION: Stable cardiomegaly. Poor inspiratory effort although no focal infiltrate is seen. Electronically Signed   By: Inez Catalina M.D.   On: 03/17/2017 13:48   Dg Chest Port 1 View  Result Date: 03/11/2017 CLINICAL DATA:  Leukocytosis. EXAM: PORTABLE CHEST 1 VIEW COMPARISON:  03/03/2017 FINDINGS: Patient is rotated to the left. The cardio pericardial silhouette is enlarged. Slight improvement in left  base aeration. Tracheostomy tube remains in place. A feeding tube passes into the stomach although the distal tip position is not included on the film. The visualized bony structures of the thorax are intact. Telemetry leads overlie the chest. IMPRESSION: Persistent enlargement of the cardiopericardial silhouette without evidence for overt pulmonary edema or substantial pleural effusion. Electronically Signed   By: Misty Stanley M.D.   On: 03/11/2017 08:37   Dg Chest Port 1 View  Result Date: 03/03/2017 CLINICAL DATA:  Respiratory failure. EXAM: PORTABLE CHEST 1 VIEW COMPARISON:  03/01/2017 . FINDINGS: Tracheostomy tube and feeding tube in stable position. Cardiomegaly with mild diffuse interstitial prominence suggesting mild CHF. Persistent atelectasis and consolidation left lower lobe. Small left pleural effusion cannot be excluded. No pneumothorax. IMPRESSION: 1. Tracheostomy tube and feeding tube in stable position. 2. Cardiomegaly with diffuse mild interstitial prominence suggesting mild CHF. 3. Persistent left lower lobe atelectasis and consolidation. Small left pleural effusion cannot be excluded . Electronically Signed   By: Marcello Moores  Register   On: 03/03/2017 07:09   Dg Chest Port 1 View  Result Date: 03/01/2017 CLINICAL DATA:  Respiratory failure EXAM: PORTABLE CHEST 1 VIEW COMPARISON:  02/27/2017 FINDINGS: 0546 hours. Patient rotated to the left. Tracheostomy tube remains in place. A feeding tube passes into the stomach although the distal tip position is not included on the film. The cardio pericardial silhouette is enlarged. Left base collapse/consolidation again noted. There is pulmonary vascular congestion without overt pulmonary edema. IMPRESSION: Rotated film with cardiomegaly and  vascular congestion. Persistent left base collapse/ consolidation. Electronically Signed   By: Misty Stanley M.D.   On: 03/01/2017 07:43   Dg Chest Port 1 View  Result Date: 02/27/2017 CLINICAL DATA:  Hypoxia. EXAM:  PORTABLE CHEST 1 VIEW COMPARISON:  02/26/2017. FINDINGS: Tracheostomy to in stable position. Stable cardiomegaly. Low lung volumes. Progressive left lower lobe atelectasis and infiltrate. Small left pleural effusion cannot be excluded . IMPRESSION: 1. Tracheostomy tube in stable position. 2. Progressive left lower lobe atelectasis and infiltrate. Small left pleural effusion cannot be excluded . Electronically Signed   By: Marcello Moores  Register   On: 02/27/2017 07:01   Dg Chest Port 1 View  Result Date: 02/26/2017 CLINICAL DATA:  Status post tracheostomy placement. EXAM: PORTABLE CHEST 1 VIEW COMPARISON:  Earlier today. FINDINGS: The endotracheal tube has been removed and replaced with a tracheostomy tube in satisfactory position. The nasogastric tube and esophageal probe have been removed. The left jugular catheter has been removed. No pneumothorax. Grossly stable enlarged cardiac silhouette. Left lower lobe opacity with improvement laterally since 02/25/2017. Clear right lung. Unremarkable bones. IMPRESSION: 1. Tracheostomy tube in satisfactory position. 2. Left lower lobe atelectasis or pneumonia with mild improvement since 2 days ago. 3. Stable cardiomegaly. Electronically Signed   By: Claudie Revering M.D.   On: 02/26/2017 16:02   Dg Chest Port 1 View  Result Date: 02/26/2017 CLINICAL DATA:  Hypoxia.  Shortness of breath. EXAM: PORTABLE CHEST 1 VIEW COMPARISON:  02/25/2017. FINDINGS: Endotracheal tube, NG tube, left IJ line esophageal probe in stable position. Cardiomegaly with bilateral pulmonary interstitial infiltrates consistent with CHF. Left base atelectasis . Small left pleural effusion. Similar findings noted on prior exam. No pneumothorax. IMPRESSION: 1. Lines and tubes in stable position. 2. Cardiomegaly with bilateral pulmonary interstitial prominence and small left pleural effusion noted consistent CHF. Left base atelectasis. Similar findings noted on prior exam. Electronically Signed   By: [place]   On: 02/26/2017 07:18   Dg Chest Port 1 View  Result Date: 02/25/2017 CLINICAL DATA:  Respiratory failure, intubated patient. Diabetes, cardiomyopathy, morbid obesity, end-stage renal disease. EXAM: PORTABLE CHEST 1 VIEW COMPARISON:  Portable chest x-ray of Feb 24, 2017 FINDINGS: The lungs are adequately inflated. The pulmonary interstitial markings are slightly less prominent today. The pulmonary vascularity remains engorged. The cardiac silhouette remains enlarged. Retrocardiac region on the left is slightly less dense. The endotracheal tube tip lies 5.7 cm above the carina. The esophagogastric tube tip projects below the inferior margin of the image. The left internal jugular venous catheter tip projects over the proximal SVC. IMPRESSION: Slight interval improvement in pulmonary interstitial edema. Stable cardiomegaly. Persistent left lower lobe atelectasis or pneumonia. The support tubes are in reasonable position. Electronically Signed   By: David  Martinique M.D.   On: 02/25/2017 07:57   Dg Chest Port 1 View  Result Date: 02/24/2017 CLINICAL DATA:  Respiratory failure EXAM: PORTABLE CHEST 1 VIEW COMPARISON:  Two days ago FINDINGS: Endotracheal tube tip at the clavicular heads. An orogastric tube reaches the stomach. Esophageal thermistor. Left IJ central line with tip at the SVC level. Cardiomegaly and diffuse hazy opacity with cephalized blood flow. No definitive effusion. No pneumothorax. IMPRESSION: 1. Stable positioning of tubes and central line. 2. CHF pattern. Electronically Signed   By: Monte Fantasia M.D.   On: 02/24/2017 07:31   Dg Chest Port 1 View  Result Date: 02/22/2017 CLINICAL DATA:  Respiratory failure EXAM: PORTABLE CHEST 1 VIEW COMPARISON:  02/21/2017 FINDINGS: Endotracheal tube  terminates 5 cm above the carina. Cardiomegaly with mild interstitial edema. Retrocardiac opacity, possibly reflecting a combination of atelectasis and pleural effusion, pneumonia not excluded. No  pneumothorax. Left IJ venous catheter terminates at the cavoatrial junction. Enteric tube courses into the stomach. IMPRESSION: Endotracheal tube terminates 5 cm above the carina. Cardiomegaly with mild interstitial edema. Retrocardiac opacity, possibly atelectasis and pleural effusion, pneumonia not excluded. Electronically Signed   By: Julian Hy M.D.   On: 02/22/2017 07:30   Dg Abd Portable 1v  Result Date: 03/02/2017 CLINICAL DATA:  Encounter for feeding tube placement EXAM: PORTABLE ABDOMEN - 1 VIEW COMPARISON:  Portable exam 1712 hours compared to CT abdomen and pelvis of 03/01/2017 FINDINGS: Feeding tube traverses abdomen with tip projecting over distal antrum near pylorus. Cardiac silhouette appears enlarged. Visualized bowel gas pattern normal. Osseous structures unremarkable. IMPRESSION: Tip of feeding tube projects over distal gastric antrum near pylorus. Electronically Signed   By: Lavonia Dana M.D.   On: 03/02/2017 17:21   Dg Abd Portable 1v  Result Date: 02/27/2017 CLINICAL DATA:  Feeding tube placement EXAM: PORTABLE ABDOMEN - 1 VIEW COMPARISON:  None. FINDINGS: Feeding tube with the tip projecting over the antrum of the stomach. There is no bowel dilatation to suggest obstruction. There is no evidence of pneumoperitoneum, portal venous gas or pneumatosis. There are no pathologic calcifications along the expected course of the ureters. The osseous structures are unremarkable. IMPRESSION: Feeding tube with the tip projecting over the antrum of the stomach. Electronically Signed   By: Kathreen Devoid   On: 02/27/2017 11:05   Dg Swallowing Func-speech Pathology  Result Date: 03/21/2017 Objective Swallowing Evaluation: Type of Study: MBS-Modified Barium Swallow Study Patient Details Name: Jansen Goodpasture MRN: [id number] Date of Birth: 03-17-1981 Today's Date: 03/21/2017 Time: SLP Start Time (ACUTE ONLY): 0905-SLP Stop Time (ACUTE ONLY): 0915 SLP Time Calculation (min) (ACUTE ONLY): 10 min Past  Medical History: Past Medical History: Diagnosis Date . Anemia  . Arthritis   "left hand" (09/15/2013) . Asthma  . CHF (congestive heart failure) ([place])  . Chronic bronchitis ([place])   "just about q yr" (09/15/2013) . Chronic kidney disease   "low kidney function" (09/15/2013) ,  T/Th/Sa . COPD (chronic obstructive pulmonary disease) ([place])  . Coronary artery disease  . Hyperlipidemia  . Hypertension  . Migraine   "get them alot" (09/15/2013) . Myocardial infarction [facility]) 04/2015  NSTEMI . Normal coronary arteries   by cardiac catheterization 09/20/13 . Peripheral vascular disease ([place])  . Pneumonia   "couple times; have it now" (09/15/2013) . PONV (postoperative nausea and vomiting)  . Restless legs  . Shortness of breath   "just recently; related to the pneumonia" (09/15/2013) . Type 1 diabetes mellitus ([place])   type 2 Past Surgical History: Past Surgical History: Procedure Laterality Date . AMPUTATION Right 06/25/2016  Procedure: Amputation Right Great Toe at the Metatarsophalangeal Joint;  Surgeon: Newt Minion, MD;  Location: [place];  Service: Orthopedics;  Laterality: Right; . AMPUTATION Bilateral 10/08/2016  Procedure: Bilateral Transmetatarsal Amputation;  Surgeon: Newt Minion, MD;  Location: [place];  Service: Orthopedics;  Laterality: Bilateral; . AMPUTATION Left 03/13/2017  Procedure: AMPUTATION ABOVE KNEE;  Surgeon: Newt Minion, MD;  Location: [place];  Service: Orthopedics;  Laterality: Left; . AV FISTULA PLACEMENT Left 03/27/2015  Procedure: CREATION RADIAL CEPHALIC ARTERIOVENOUS FISTULA;  Surgeon: Angelia Mould, MD;  Location: [place];  Service: Vascular;  Laterality: Left; . AV FISTULA PLACEMENT Left 11/23/2015  Procedure:  LEFT ARM  BASILIC VEIN TRANSPOSITION  ;  Surgeon: Angelia Mould, MD;  Location: [place];  Service: Vascular;  Laterality: Left; . CESAREAN SECTION  1999; 2006 . CORONARY ANGIOGRAM  09/20/2013  Procedure: CORONARY ANGIOGRAM;  Surgeon: Lorretta Harp, MD;  Location: [facility] CATH LAB;   Service: Cardiovascular;; . FINGER SURGERY Left 1985  3rd and 4th digits reconstructed after cut off" (09/15/2013) . IR FLUORO GUIDE CV LINE RIGHT  03/11/2017 . IR US GUIDE VASC ACCESS RIGHT  03/11/2017 . PERIPHERAL VASCULAR CATHETERIZATION N/A 09/05/2016  Procedure: Abdominal Aortogram;  Surgeon: Elam [place], MD;  Location: [place] CV LAB;  Service: Cardiovascular;  Laterality: N/A; . PERIPHERAL VASCULAR CATHETERIZATION Bilateral 09/05/2016  Procedure: Lower Extremity Angiography;  Surgeon: Elam [place], MD;  Location: [place] CV LAB;  Service: Cardiovascular;  Laterality: Bilateral; . PERIPHERAL VASCULAR CATHETERIZATION Right 09/05/2016  Procedure: Peripheral Vascular Balloon Angioplasty;  Surgeon: Elam [place], MD;  Location: [place] CV LAB;  Service: Cardiovascular;  Laterality: Right;  peroneal and AT . SHOULDER ARTHROSCOPY WITH BICEPSTENOTOMY Right 05/10/2015  Procedure: RIGHT SHOULDER ARTHROSCOPY WITH BICEPS TENOTOMY, DEBRIDEMENT LABRAL TEAR;  Surgeon: Tania Ade, MD;  Location: [place];  Service: Orthopedics;  Laterality: Right;  Right shoulder arthroscopy biceps tenotomy, debridement labral tear . STUMP REVISION Left 01/21/2017  Procedure: . Revision Left Transmetatarsal Amputation;  Surgeon: Newt Minion, MD;  Location: [place];  Service: Orthopedics;  Laterality: Left; . TONSILLECTOMY  1997 . 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 ETT 5/19 and trach'd 5/31. Hospital course included cardiac arrest, transfer to ICU No Data Recorded Assessment / Plan / Recommendation CHL IP CLINICAL IMPRESSIONS 03/21/2017 Clinical Impression Unfortunately pts arousal was a major barrier to success  on this exam. Pt fully alert during bedside eval the preceeding day, and awake but drowsy on arrival to radiology. When she was transferred to the chair and positioned, pt could not appropriately sustain arousal without max cues. SLP was able to provide stimuli and tactile cueing for one bite of puree. Pt orally transited this eventually and triggered a weak, ineffective swallow leaving the majority of the bolus in pharynx. With max cues and painful stimuli SLP was able to cue pt to trigger two more swallows to clear bolus from pharynx. At that time the study was discontinued due to risk to pt and inability to progress further. This is likely not indicative of pts function and there is potential for ability to consume PO, but pt will need to be fully alert and participatory. Will follow for further attempts.  SLP Visit Diagnosis Dysphagia, unspecified (R13.10) Attention and concentration deficit following -- Frontal lobe and executive function deficit following -- Impact on safety and function Severe aspiration risk   CHL IP TREATMENT RECOMMENDATION 03/21/2017 Treatment Recommendations Therapy as outlined in treatment plan below   No flowsheet data found. CHL IP DIET RECOMMENDATION 03/21/2017 SLP Diet Recommendations NPO;Alternative means - temporary Liquid Administration via -- Medication Administration -- Compensations -- Postural Changes --   No flowsheet data found.  CHL IP FOLLOW UP RECOMMENDATIONS 03/21/2017 Follow up Recommendations Skilled Nursing facility   [facility] IP FREQUENCY AND DURATION 03/21/2017 Speech Therapy Frequency (ACUTE ONLY)  min 2x/week Treatment Duration 2 weeks      CHL IP ORAL PHASE 03/21/2017 Oral Phase Impaired Oral - Pudding Teaspoon -- Oral - Pudding Cup -- Oral - Honey Teaspoon -- Oral - Honey Cup -- Oral - Nectar Teaspoon -- Oral - Nectar Cup -- Oral - Nectar Straw -- Oral - Thin Teaspoon -- Oral - Thin Cup -- Oral - Thin Straw -- Oral - Puree Delayed oral transit;Reduced posterior propulsion  Oral - Mech Soft -- Oral - Regular -- Oral - Multi-Consistency -- Oral - Pill -- Oral Phase - Comment --  CHL IP PHARYNGEAL PHASE 03/21/2017 Pharyngeal Phase Impaired Pharyngeal- Pudding Teaspoon -- Pharyngeal -- Pharyngeal- Pudding Cup -- Pharyngeal -- Pharyngeal- Honey Teaspoon -- Pharyngeal -- Pharyngeal- Honey Cup -- Pharyngeal -- Pharyngeal- Nectar Teaspoon -- Pharyngeal -- Pharyngeal- Nectar Cup -- Pharyngeal -- Pharyngeal- Nectar Straw -- Pharyngeal -- Pharyngeal- Thin Teaspoon -- Pharyngeal -- Pharyngeal- Thin Cup -- Pharyngeal -- Pharyngeal- Thin Straw -- Pharyngeal -- Pharyngeal- Puree Reduced laryngeal elevation;Reduced tongue base retraction;Reduced pharyngeal peristalsis;Reduced epiglottic inversion;Delayed swallow initiation-vallecula;Pharyngeal residue - valleculae;Pharyngeal residue - pyriform Pharyngeal -- Pharyngeal- Mechanical Soft -- Pharyngeal -- Pharyngeal- Regular -- Pharyngeal -- Pharyngeal- Multi-consistency -- Pharyngeal -- Pharyngeal- Pill -- Pharyngeal -- Pharyngeal Comment --  No flowsheet data found. No flowsheet data found. De[place], Katherene Ponto 03/21/2017, 9:55 AM                LOS: 37 days   Reyne Dumas, MD  [facility] Pager: [phone]  If 7PM-7AM, please contact night-coverage [email or web address] Password [facility] 03/23/2017, 12:18 PM

## 2017-03-23 NOTE — Procedures (Signed)
Tracheostomy Change Note  Patient Details:   Name: Karema Knudtson DOB: 04/21/1981 MRN: [id number]    Airway Documentation: [name] changed at this time per MD order.  #[id number] placed without complication assisted by J.Haley RRT, +ETco2 color change, +BBS, sx cath passed.    Evaluation  O2 sats: stable throughout Complications: No apparent complications Patient did tolerate procedure well. Bilateral Breath Sounds: Diminished coarse    Cherylin Mylar 03/23/2017, 2:36 PM

## 2017-03-23 NOTE — Progress Notes (Signed)
Subjective:   Events noted- did not tolerate HD yest- tachy to 140- treatment ended early - had fever - HR still in the 120's this AM  Objective Vital signs in last 24 hours: Vitals:   03/23/17 0930 03/23/17 0945 03/23/17 1000 03/23/17 1015  BP: (!) 83/71 95/63 (!) 94/59 95/60  Pulse: (!) 121 (!) 121 (!) 124 (!) 117  Resp: (!) 29 (!) 31 (!) 35 (!) 36  Temp:      TempSrc:      SpO2:      Weight:      Height:       Weight change: 2 kg (4 lb 6.6 oz)  Intake/Output Summary (Last 24 hours) at 03/23/17 1033 Last data filed at 03/23/17 0600  Gross per 24 hour  Intake            293.5 ml  Output                0 ml  Net            293.5 ml    Dialysis:  [facility] TTS 4h 32mn   F200   114kg   2/2 bath  LUA AVF Iron Sucrose (Venofer) 50 mg IVP During Dialysis 1X Week  Mircera 75 mcg IV q  2 weeks ( last on 02/05/17) Vitamin D (Calcitriol) Oral 0.75 mcg  Po q hd    Summary: Pt is a 36y.o. 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 Anoxic brain injury-  pt previously very functional- is tracking and following some commands- does not appear that family is willing to back off on anything at this time 2. ESRD - on TTS HD. Didn't tolerate Sat HD , getting HD today off schedule. Plan MWSat HD this week 3. Anemia- hgb 8.7 up from 7.6- on max aranesp 4. MBD of CKD - Ca and Phos in range, will dc CaCO3.  No binders. Alk phos ^, will check pth.  5. HTN/volume- not an issue- on lopressor but mostly due to tachycardia 6. PVD- now s/p L AKA- wound vac  RKelly SplinterMD CEastern State Hospitalpgr [phone]  03/23/2017, 10:38 AM       Labs: Basic Metabolic Panel:  Recent Labs Lab 03/21/17 1430 03/22/17 0501 03/23/17 0303  NA 143 144 144  K 4.2 4.5 4.3  CL 99* 101 100*  CO2 26 27 26   GLUCOSE 206* 240* 196*  BUN 73* 89* 137*  CREATININE 4.99* 5.81* 7.30*  CALCIUM 9.4 9.4 9.4  PHOS 5.6*   --   --    Liver Function Tests:  Recent Labs Lab 03/19/17 0504 03/21/17 1430 03/22/17 0501 03/23/17 0303  AST 24  --  26 27  ALT 7*  --  13* 13*  ALKPHOS 288*  --  326* 298*  BILITOT 1.3*  --  1.5* 1.4*  PROT 6.9  --  7.4 7.2  ALBUMIN 2.3* 2.8* 2.8* 2.7*   No results for input(s): LIPASE, AMYLASE in the last 168 hours. No results for input(s): AMMONIA in the last 168 hours. CBC:  Recent Labs Lab 03/17/17 0405 03/19/17 0504 03/21/17 1430 03/22/17 0501 03/23/17 0303  WBC 19.4* 18.6* 19.4* 23.8* 20.5*  HGB 7.6* 8.2* 8.8* 8.7* 8.3*  HCT 26.1* 28.5* 30.7* 30.8* 28.8*  MCV 95.3 99.0 99.0 100.0 99.7  PLT 402* 438* 555* 483* 486*   Cardiac Enzymes:  Recent Labs Lab 03/21/17 1855  TROPONINI 0.04*   CBG:  Recent Labs Lab 03/22/17 1627 03/22/17 2105 03/22/17 2352 03/23/17 0352 03/23/17 0758  GLUCAP 193* 209* 234* 190* 167*    Iron Studies: No results for input(s): IRON, TIBC, TRANSFERRIN, FERRITIN in the last 72 hours. Studies/Results: Ct Angio Chest Pe W Or Wo Contrast  Result Date: 03/22/2017 CLINICAL DATA:  Tachycardia. EXAM: CT ANGIOGRAPHY CHEST WITH CONTRAST TECHNIQUE: Multidetector CT imaging of the chest was performed using the standard protocol during bolus administration of intravenous contrast. Multiplanar CT image reconstructions and MIPs were obtained to evaluate the vascular anatomy. CONTRAST:  100 cc Isovue COMPARISON:  None. FINDINGS: Cardiovascular: No filling defects within the pulmonary arteries arteries to suggest acute pulmonary embolism. Mediastinum/Nodes: No axillary or supraclavicular adenopathy. No mediastinal adenopathy. Tracheostomy tube extends the mid trachea. Feeding tube extends the stomach esophagus. Lungs/Pleura: Mild basilar atelectasis. No pleural effusion. No infiltrate or overt pulmonary edema. Upper Abdomen: Limited view of the liver, kidneys, pancreas are unremarkable. Normal adrenal glands. Musculoskeletal: No aggressive osseous  lesion. Review of the MIP images confirms the above findings. IMPRESSION: 1. No pulmonary embolism. 2. Bibasilar atelectasis. 3. Tracheostomy tube appears in good position Electronically Signed   By: Suzy Bouchard M.D.   On: 03/22/2017 13:52   Dg Chest Port 1 View  Result Date: 03/21/2017 CLINICAL DATA:  Fever. EXAM: PORTABLE CHEST 1 VIEW COMPARISON:  March 17, 2017 FINDINGS: A tracheostomy tube is stable. A feeding tube terminates below today's film. A right-sided line terminates in the right atrium, unchanged. Stable cardiomegaly. No pneumothorax. No pulmonary nodules, masses, or focal infiltrates. IMPRESSION: Stable support apparatus. Cardiomegaly without overt edema. No change. Electronically Signed   By: Dorise Bullion III M.D   On: 03/21/2017 14:56   Medications: Infusions: . sodium chloride 10 mL/hr at 03/16/17 1634  . sodium chloride    . sodium chloride    . sodium chloride    . sodium chloride    . sodium chloride    . sodium chloride      Scheduled Medications: . calcium carbonate (dosed in mg elemental calcium)  250 mg of elemental calcium Oral TID  . carvedilol  12.5 mg Oral BID WC  . chlorhexidine gluconate (MEDLINE KIT)  15 mL Mouth Rinse BID  . darbepoetin (ARANESP) injection - DIALYSIS  200 mcg Intravenous Q Tue-HD  . feeding supplement (NEPRO CARB STEADY)  1,000 mL Per Tube Q24H  . feeding supplement (PRO-STAT SUGAR FREE 64)  60 mL Per Tube QID  . [name]'s butt cream   Topical QID  . heparin  5,000 Units Subcutaneous Q8H  . insulin aspart  0-15 Units Subcutaneous Q4H  . mouth rinse  15 mL Mouth Rinse QID  . multivitamin  1 tablet Oral QHS  . pantoprazole sodium  40 mg Per Tube Q24H  . saccharomyces boulardii  250 mg Oral BID  . sodium chloride flush  10-40 mL Intracatheter Q12H    have reviewed scheduled and prn medications.  Physical Exam: General: tracking some- following some commands- on just O2 via trach Heart: tachy Lungs: mostly clear- louder  sounds Abdomen: obese, soft Extremities: foot in boot- L AKA with wound vac Dialysis Access: avf on left - good thrill and bruit   03/23/2017,10:33 AM  LOS: 37 days

## 2017-03-23 NOTE — Progress Notes (Addendum)
SLP Cancellation Note  Patient Details Name: [name] MRN: [id number] DOB: 04/24/1981   Cancelled treatment:       Reason Eval/Treat Not Completed: Patient at procedure or test/unavailable. Pt currently on dialysis in room. Typically schedule in T/TH/Sat, but did not complete dialysis on sat, so making up today and will likely have it tomorrow as well. Discussed results of MBS with mother (pt too lethargic) and plan to attempt FEES when/if pt is appropriately alert, potentially on Wednesday. Mother reports [name] often goes nights without sleeping and can be very lethargic for long periods. Depending on the result of FEES, this could be a barrier to safe/consistent intake regardless of swallow function.    Judeen Geralds, [name] 03/23/2017, 10:15 AM

## 2017-03-23 NOTE — Progress Notes (Signed)
Inpatient Diabetes Program Recommendations  AACE/ADA: New Consensus Statement on Inpatient Glycemic Control (2015)  Target Ranges:  Prepandial:   less than 140 mg/dL      Peak postprandial:   less than 180 mg/dL (1-2 hours)      Critically ill patients:  140 - 180 mg/dL   Lab Results  Component Value Date   GLUCAP 167 (H) 03/23/2017   HGBA1C 5.2 10/08/2016    Review of Glycemic ControlResults for [name], [name] (MRN [id number]) as of 03/23/2017 11:38  Ref. Range 03/22/2017 21:05 03/22/2017 23:52 03/23/2017 03:52 03/23/2017 07:58  Glucose-Capillary Latest Ref Range: 65 - 99 mg/dL 597 (H) 416 (H) 384 (H) 167 (H)   Diabetes history: Type 1 diabetes Current orders for Inpatient glycemic control:  Novolog moderate q 4 hours Inpatient Diabetes Program Recommendations:    Consider adding Lantus 6 units daily.  Thanks, Beryl Meager, RN, BC-ADM Inpatient Diabetes Coordinator Pager [phone] (8a-5p)

## 2017-03-23 NOTE — Progress Notes (Signed)
Patient ID: [name], female   DOB: Aug 14, 1981, 36 y.o.   MRN: [id number] Patient is 10 days status post above-knee amputation. Orders written to discontinue the wound VAC, remove the wound VAC dressing, apply dry dressing change daily.

## 2017-03-23 NOTE — Progress Notes (Signed)
PULMONARY / CRITICAL CARE MEDICINE   Name: [name] MRN: [id number] DOB: [birth year]    ADMISSION DATE:  02/14/2017 CONSULTATION DATE:  02/14/2017  REFERRING MD:  Feliz Beam MD  CHIEF COMPLAINT:  Sepsis, cardiac arrest  BRIEF SUMMARY:   36 year old female w/ ESRD d/t poorly controlled DM, bilateral transmetatarsal foot amputations, obesity, HTN, medical non-compliance as well as NICM. Admitted 5/19 with complaints of left leg pain, found to have radiological evidence of osteomyelitis with wound nonhealing, dehiscence, purulent drainage from the wound. Admitted for IV antibiotics, hemodialysis and possible surgery.  Found unresponsive in the room on 5/19. She had chest compressions, epi bicarb given. She was intubated by anesthesia and had ROSC in approximately 9 minutes. Downtime prior to code is unknown. She got dilaudid in the afternoon for leg pain.  SUBJECTIVE:  Did not tolerate HD 6/24. Tachy, febrile. Off vent since 6/18.   VITAL SIGNS: BP 92/65   Pulse (!) 115   Temp 98.4 F (36.9 C) (Axillary)   Resp (!) 33   Ht 5' 8.5" (1.74 m)   Wt 80 kg (176 lb 5.9 oz)   LMP  (LMP Unknown) Comment: Patient trached  SpO2 98%   BMI 26.43 kg/m   VENTILATOR SETTINGS: FiO2 (%):  [28 %] 28 %  INTAKE / OUTPUT: I/O last 3 completed shifts: In: 503.5 [NG/GT:503.5] Out: -   General:  Chronically ill appearing female, NAD  HEENT: MM pink/moist, trach site c/d  Neuro:  Awake, sliding around in bed, tracks, does not follow commands  CV: s1s2 rrr, no m/r/g PULM: even/non-labored, diminished bases  NW:GNFA, non-tender, bsx4 active  Extremities: warm/dry, 1+ BLE edema  Skin: no rashes or lesions  LABS:  BMET  Recent Labs Lab 03/21/17 1430 03/22/17 0501 03/23/17 0303  NA 143 144 144  K 4.2 4.5 4.3  CL 99* 101 100*  CO2 26 27 26   BUN 73* 89* 137*  CREATININE 4.99* 5.81* 7.30*  GLUCOSE 206* 240* 196*   Electrolytes  Recent Labs Lab 03/19/17 0504 03/21/17 1430  03/22/17 0501 03/23/17 0303  CALCIUM 8.9 9.4 9.4 9.4  MG 2.2  --   --   --   PHOS  --  5.6*  --   --    CBC  Recent Labs Lab 03/21/17 1430 03/22/17 0501 03/23/17 0303  WBC 19.4* 23.8* 20.5*  HGB 8.8* 8.7* 8.3*  HCT 30.7* 30.8* 28.8*  PLT 555* 483* 486*   No results for input(s): PHART, PCO2ART, PO2ART in the last 168 hours.  Liver Enzymes  Recent Labs Lab 03/19/17 0504 03/21/17 1430 03/22/17 0501 03/23/17 0303  AST 24  --  26 27  ALT 7*  --  13* 13*  ALKPHOS 288*  --  326* 298*  BILITOT 1.3*  --  1.5* 1.4*  ALBUMIN 2.3* 2.8* 2.8* 2.7*   Glucose  Recent Labs Lab 03/22/17 1147 03/22/17 1627 03/22/17 2105 03/22/17 2352 03/23/17 0352 03/23/17 0758  GLUCAP 190* 193* 209* 234* 190* 167*   STUDIES:  Lower extremity ultrasound 5/18 >> no evidence of DVT CT head 5/19 >> normal exam Echo 5/20 >> EF 30-35%, increased LVF. Diffuse hypokinesis, akinesis of basilar mid-inferior myocardium, grade 2 diastolic dysfxn  EEG 5/20 >> finding c/w mod to severe global cerebral dysfxn. C/w anoxic injury given clinical course  LE [place] 5/21 >> negative MRI brain 5/24 >> ?thrombosed cortical vein, otherwise normal TEE 6/4 >> mild MR, normal AV, mild TR, mild PR, EF 30-35%, diffuse hypokinesis, no  thrombus, no PFO, normal RV, moderate pericardial effusion with synechia suggesting some chronicity, no vegetations   CULTURES: Bcx 5/19 X 2 >> negative Blood cultures 6/1 >> negative c diff 6/1 >> negative  ANTIBIOTICS: Clinidamycin 5/19 >> 5/21 Vancomycin 5/19 >> 5/25 >> restart 5/30 Zosyn 5/19 >> 5/22 Meropenem 5/22 >> 5/30 Flagyl 5/30 >>off Ceftriaxone 5/30 >>off  SIGNIFICANT EVENTS: 5/19  Admit, cardiac arrest, transfer to ICU; hypothermia protocol started 5/24  Off pressors.  MRI w/out evidence of ischemia  6/03  Tolerated ATC couple hours, then back to vent, HD   LINES/TUBES: ETT 5/19>>>5/31 Lt IJ CVL 5/19>>>out Trach 5/31>>>  ASSESSMENT / PLAN:  Discussion:  36 y/o  F with PMH of poorly controlled DM, ESRD on HD and known gangrene of L achilles heel, admitted with non-healing wounds.  Admitted per TRH.  Received dilaudid for pain and subsequently suffered a cardiac arrest.  Pulmonary: Trach dependent respiratory failure post cardiac arrest  P: Maintain on TC as tolerated Change to #4 cuffless trach per RT  Trach care per protocol PT as able Intermittent CXR Speech following    PCCM will f/u once weekly, please call sooner if needed.    Dirk Dress, NP 03/23/2017  11:22 AM Pager: [phone] or [phone]   STAFF NOTE: I, Rory Percy, MD FACP have personally reviewed patient's available data, including medical history, events of note, physical examination and test results as part of my evaluation. I have discussed with resident/NP and other care providers such as pharmacist, RN and RRT. In addition, I personally evaluated patient and elicited key findings of: awakens, not speaking, remains on trach collase, lungs with crackles, CT which I reviewed has pulm edema, she is on HD and they have 1 liter off, hope canmaintain neg balance with findings , she is not speaking and likley first we should downsize and dc cuff to 4 cuffless and then assess ability to speak, if unable may be cortical from brain injury, remain on trach collar, I updated mom, I reviewed all past films and CT as noted with edema   Mcarthur Rossetti. Tyson Alias, MD, FACP Pgr: [phone]  Pulmonary & Critical Care 03/23/2017 11:54 AM

## 2017-03-24 LAB — GLUCOSE, CAPILLARY
GLUCOSE-CAPILLARY: 206 mg/dL — AB (ref 65–99)
GLUCOSE-CAPILLARY: 209 mg/dL — AB (ref 65–99)
Glucose-Capillary: 186 mg/dL — ABNORMAL HIGH (ref 65–99)
Glucose-Capillary: 128 mg/dL — ABNORMAL HIGH (ref 65–99)
Glucose-Capillary: 191 mg/dL — ABNORMAL HIGH (ref 65–99)

## 2017-03-24 LAB — COMPREHENSIVE METABOLIC PANEL
ALK PHOS: 350 U/L — AB (ref 38–126)
ALT: 17 U/L (ref 14–54)
AST: 34 U/L (ref 15–41)
Albumin: 3.9 g/dL (ref 3.5–5.0)
Anion gap: 19 — ABNORMAL HIGH (ref 5–15)
BILIRUBIN TOTAL: 1.5 mg/dL — AB (ref 0.3–1.2)
BUN: 71 mg/dL — ABNORMAL HIGH (ref 6–20)
CALCIUM: 9.9 mg/dL (ref 8.9–10.3)
CO2: 27 mmol/L (ref 22–32)
CREATININE: 4.26 mg/dL — AB (ref 0.44–1.00)
Chloride: 94 mmol/L — ABNORMAL LOW (ref 101–111)
GFR calc Af Amer: 14 mL/min — ABNORMAL LOW (ref >60)
GFR calc non Af Amer: 12 mL/min — ABNORMAL LOW (ref >60)
Glucose, Bld: 157 mg/dL — ABNORMAL HIGH (ref 65–99)
Potassium: 4.1 mmol/L (ref 3.5–5.1)
SODIUM: 140 mmol/L (ref 135–145)
Total Protein: 8.5 g/dL — ABNORMAL HIGH (ref 6.5–8.1)

## 2017-03-24 LAB — CBC
HEMATOCRIT: 33.4 % — AB (ref 36.0–46.0)
HEMOGLOBIN: 9.5 g/dL — AB (ref 12.0–15.0)
MCH: 28.4 pg (ref 26.0–34.0)
MCHC: 28.4 g/dL — ABNORMAL LOW (ref 30.0–36.0)
MCV: 99.7 fL (ref 78.0–100.0)
Platelets: 441 10*3/uL — ABNORMAL HIGH (ref 150–400)
RBC: 3.35 MIL/uL — AB (ref 3.87–5.11)
RDW: 17.7 % — ABNORMAL HIGH (ref 11.5–15.5)
WBC: 18.8 10*3/uL — ABNORMAL HIGH (ref 4.0–10.5)

## 2017-03-24 LAB — PARATHYROID HORMONE, INTACT (NO CA): PTH: 88 pg/mL — AB (ref 15–65)

## 2017-03-24 MED ORDER — ALTEPLASE 2 MG IJ SOLR
2.0000 mg | Freq: Once | INTRAMUSCULAR | Status: AC
  Administered 2017-03-24: 2 mg
  Filled 2017-03-24: qty 2

## 2017-03-24 MED ORDER — LOPERAMIDE HCL 2 MG PO CAPS
2.0000 mg | ORAL_CAPSULE | ORAL | Status: DC | PRN
  Administered 2017-03-24 – 2017-03-30 (×3): 2 mg via ORAL
  Filled 2017-03-24 (×3): qty 1

## 2017-03-24 NOTE — Progress Notes (Signed)
  Speech Language Pathology    Patient Details Name: [name] MRN: [id number] DOB: 1981-07-16 Today's Date: 03/24/2017 Time:  -     RN, Caitlin notified this SLP that Raesha's dialysis will be tomorrow and not today. SLP contacted Amber in dialysis who will tag the chart to NOT start dialysis before 9:30 because pt needs FEES test in room prior to HD starting.  ST plans for FEES around 8:30 tomorrow am.                  [name] 03/24/2017, 11:34 AM   [name] [name].Ed [employer] [phone]

## 2017-03-24 NOTE — Progress Notes (Addendum)
Subjective:   Did well with HD yesterday, no fluid off.  No new c/o, spoke w/ mother.   Objective Vital signs in last 24 hours: Vitals:   03/24/17 0352 03/24/17 0744 03/24/17 1139 03/24/17 1233  BP:   118/79 (!) 83/53  Pulse:  (!) 110 96 98  Resp:  (!) 30 (!) 21 (!) 24  Temp:    (!) 96.5 F (35.8 C)  TempSrc:    Axillary  SpO2:  96% 96% 99%  Weight: 74 kg (163 lb 2.3 oz)     Height:       Weight change: -1 kg (-2 lb 3.3 oz)  Intake/Output Summary (Last 24 hours) at 03/24/17 1248 Last data filed at 03/24/17 1000  Gross per 24 hour  Intake              270 ml  Output              700 ml  Net             -430 ml    Dialysis:  [facility] TTS 4h 50mn   F200   114kg   2/2 bath  LUA AVF Iron Sucrose (Venofer) 50 mg IVP During Dialysis 1X Week  Mircera 75 mcg IV q  2 weeks ( last on 02/05/17) Vitamin D (Calcitriol) Oral 0.75 mcg  Po q hd    Summary: Pt is a 36y.o. 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 Anoxic brain injury-  pt previously very functional- is tracking and following some commands- does not appear that family is willing to back off on anything at this time 2. ESRD - on TTS HD, started HD June '17. Didn't tolerate Sat HD. Plan MWSat HD this week. Has very low fluid intakes, plan no UF with HD. HD tomorrow.  3. Anemia- hgb 9.5 which is up, on max aranesp 4. MBD of CKD - Ca and Phos in range, will dc CaCO3.  No binders. Alk phos ^, will check pth.  5. HTN - not hypertensive but on coreg for high HR, now resolved 2 wks after leg amp. Will dc coreg 6. PVD- now s/p L AKA- wound vac 7. Volume - looks euvolemic on exam or prob slightly dry  RKelly SplinterMD CNewell Rubbermaidpgr ([phone]  03/24/2017, 12:48 PM       Labs: Basic Metabolic Panel:  Recent Labs Lab 03/21/17 1430 03/22/17 0501 03/23/17 0303 03/24/17 0800  NA 143 144 144 140  K 4.2 4.5 4.3 4.1   CL 99* 101 100* 94*  CO2 26 27 26 27   GLUCOSE 206* 240* 196* 157*  BUN 73* 89* 137* 71*  CREATININE 4.99* 5.81* 7.30* 4.26*  CALCIUM 9.4 9.4 9.4 9.9  PHOS 5.6*  --   --   --    Liver Function Tests:  Recent Labs Lab 03/22/17 0501 03/23/17 0303 03/24/17 0800  AST 26 27 34  ALT 13* 13* 17  ALKPHOS 326* 298* 350*  BILITOT 1.5* 1.4* 1.5*  PROT 7.4 7.2 8.5*  ALBUMIN 2.8* 2.7* 3.9   No results for input(s): LIPASE, AMYLASE in the last 168 hours. No results for input(s): AMMONIA in the last 168 hours. CBC:  Recent Labs Lab 03/19/17 0504 03/21/17 1430 03/22/17 0501 03/23/17 0303 03/24/17 0800  WBC 18.6* 19.4* 23.8* 20.5* 18.8*  HGB 8.2* 8.8* 8.7* 8.3* 9.5*  HCT  28.5* 30.7* 30.8* 28.8* 33.4*  MCV 99.0 99.0 100.0 99.7 99.7  PLT 438* 555* 483* 486* 441*   Cardiac Enzymes:  Recent Labs Lab 03/21/17 1855  TROPONINI 0.04*   CBG:  Recent Labs Lab 03/23/17 1156 03/23/17 1630 03/23/17 2202 03/24/17 0043 03/24/17 0346  GLUCAP 155* 128* 143* 209* 206*    Iron Studies: No results for input(s): IRON, TIBC, TRANSFERRIN, FERRITIN in the last 72 hours. Studies/Results: Ct Angio Chest Pe W Or Wo Contrast  Result Date: 03/22/2017 CLINICAL DATA:  Tachycardia. EXAM: CT ANGIOGRAPHY CHEST WITH CONTRAST TECHNIQUE: Multidetector CT imaging of the chest was performed using the standard protocol during bolus administration of intravenous contrast. Multiplanar CT image reconstructions and MIPs were obtained to evaluate the vascular anatomy. CONTRAST:  100 cc Isovue COMPARISON:  None. FINDINGS: Cardiovascular: No filling defects within the pulmonary arteries arteries to suggest acute pulmonary embolism. Mediastinum/Nodes: No axillary or supraclavicular adenopathy. No mediastinal adenopathy. Tracheostomy tube extends the mid trachea. Feeding tube extends the stomach esophagus. Lungs/Pleura: Mild basilar atelectasis. No pleural effusion. No infiltrate or overt pulmonary edema. Upper  Abdomen: Limited view of the liver, kidneys, pancreas are unremarkable. Normal adrenal glands. Musculoskeletal: No aggressive osseous lesion. Review of the MIP images confirms the above findings. IMPRESSION: 1. No pulmonary embolism. 2. Bibasilar atelectasis. 3. Tracheostomy tube appears in good position Electronically Signed   By: Suzy Bouchard M.D.   On: 03/22/2017 13:52   Medications: Infusions:   Scheduled Medications: . calcium carbonate (dosed in mg elemental calcium)  250 mg of elemental calcium Oral TID  . carvedilol  12.5 mg Oral BID WC  . chlorhexidine gluconate (MEDLINE KIT)  15 mL Mouth Rinse BID  . darbepoetin (ARANESP) injection - DIALYSIS  200 mcg Intravenous Q Tue-HD  . feeding supplement (NEPRO CARB STEADY)  1,000 mL Per Tube Q24H  . feeding supplement (PRO-STAT SUGAR FREE 64)  60 mL Per Tube QID  . [name]'s butt cream   Topical QID  . heparin  5,000 Units Subcutaneous Q8H  . insulin aspart  0-15 Units Subcutaneous Q4H  . mouth rinse  15 mL Mouth Rinse QID  . multivitamin  1 tablet Oral QHS  . pantoprazole sodium  40 mg Per Tube Q24H  . saccharomyces boulardii  250 mg Oral BID  . sodium chloride flush  10-40 mL Intracatheter Q12H    have reviewed scheduled and prn medications.  Physical Exam: General: tracking some- following some commands- on just O2 via trach Heart: tachy Lungs: mostly clear- louder sounds Abdomen: obese, soft Extremities: foot in boot- L AKA with wound vac Dialysis Access: avf on left - good thrill and bruit   03/24/2017,12:48 PM  LOS: 38 days

## 2017-03-24 NOTE — Progress Notes (Signed)
Daily Progress Note   Patient Name: [name]       Date: 03/24/2017 DOB: [birth year]  Age: 36 y.o. MRN#: [id number] Attending Physician: Reyne Dumas, MD Primary Care Physician: Vicenta Aly, FNP Admit Date: 02/14/2017  Reason for Consultation/Follow-up: Establishing goals of care  Subjective: Patient more interactive that in the past.  Answering yes/no questions and rolling her eyes at me when she doesn't know the answer.   Denies pain.  Indicates she would like to eat. Talked with mother - BP is soft (systolic 87).  She is due for HD today.  Swallowing study for tomorrow (when she doesn't have HD).   Assessment: Patient appears alert and stable but with soft BP and on going diarrhea on N/G feeds (has rectal tube in place).  Still with intermittent fever (100.x), WBC around 20, alk phos around 300.  Albumin steady around 2.8  Patient Profile/HPI: 36 yo female with ESRD on HD, DM1, COPD, CAD, PVD with dry gangrene in her left lower extremitywho was admitted and suffered asystole cardiac arrest. She now has an anoxic brain injury. 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  Wound vac removed 6/25.    Length of Stay: 38  Current Medications: Scheduled Meds:  . calcium carbonate (dosed in mg elemental calcium)  250 mg of elemental calcium Oral TID  . carvedilol  12.5 mg Oral BID WC  . chlorhexidine gluconate (MEDLINE KIT)  15 mL Mouth Rinse BID  . darbepoetin (ARANESP) injection - DIALYSIS  200 mcg Intravenous Q Tue-HD  . feeding supplement (NEPRO CARB STEADY)  1,000 mL Per Tube Q24H  . feeding supplement (PRO-STAT SUGAR FREE 64)  60 mL Per Tube QID  . [name]'s butt cream   Topical QID  . heparin  5,000 Units Subcutaneous Q8H  . insulin aspart  0-15 Units Subcutaneous Q4H  . mouth rinse  15 mL Mouth  Rinse QID  . multivitamin  1 tablet Oral QHS  . pantoprazole sodium  40 mg Per Tube Q24H  . saccharomyces boulardii  250 mg Oral BID  . sodium chloride flush  10-40 mL Intracatheter Q12H    Continuous Infusions:   PRN Meds: acetaminophen (TYLENOL) oral liquid 160 mg/5 mL, albuterol, fentaNYL (SUBLIMAZE) injection, loperamide, metoprolol tartrate, sodium chloride flush  Physical Exam        Awake, pleasant, alert.  Follows commands.  Does not speak but indicates yes/no CV tachy Resp no distress Abdomen soft nt  Vital Signs: BP 117/76 (BP Location: Right Arm)   Pulse (!) 110   Temp 99.4 F (37.4 C) (Oral)   Resp (!) 30   Ht 5' 8.5" (1.74 m)   Wt 74 kg (163 lb 2.3 oz)   LMP  (LMP Unknown) Comment: Patient trached  SpO2 96%   BMI 24.44 kg/m  SpO2: SpO2: 96 % O2 Device: O2 Device: Tracheostomy Collar O2 Flow Rate: O2 Flow Rate (L/min): 6 L/min  Intake/output summary:  Intake/Output Summary (Last 24 hours) at 03/24/17 1700 Last data filed at 03/24/17 0600  Gross per 24 hour  Intake              315 ml  Output             2290 ml  Net            -1975 ml   LBM: Last BM Date: 03/22/17 (Flexiseal) Baseline Weight: Weight: 108 kg (238 lb) Most recent weight: Weight: 74 kg (163 lb 2.3 oz)       Palliative Assessment/Data:    Flowsheet Rows     Most Recent Value  Intake Tab  Referral Department  Hospitalist  Unit at Time of Referral  ICU  Palliative Care Primary Diagnosis  Nephrology  Date Notified  03/03/17  Palliative Care Type  New Palliative care  Reason for referral  Clarify Goals of Care  Date of Admission  02/14/17  Date first seen by Palliative Care  03/04/17  # of days Palliative referral response time  1 Day(s)  # of days IP prior to Palliative referral  17  Clinical Assessment  Palliative Performance Scale Score  30%  Psychosocial & Spiritual Assessment  Palliative Care Outcomes  Patient/Family meeting held?  Yes  Who was at the meeting?  mother and  sister  Palliative Care Outcomes  Clarified goals of care      Patient Active Problem List   Diagnosis Date Noted  . Fever   . Hypoxemia   . Abscess of left leg   . Tracheostomy status ([place])   . Anoxic brain injury ([place])   . Palliative care encounter   . Goals of care, counseling/discussion   . Tachycardia   . DNR (do not resuscitate) discussion 03/04/2017  . Palliative care by specialist 03/04/2017  . Protein calorie malnutrition ([place])   . Acute on chronic respiratory failure with hypoxemia ([place])   . Bacteremia   . Encounter for central line placement   . Elevated liver enzymes   . Jaundice   . Wound dehiscence   . Coma ([place])   . Osteomyelitis ([place]) 02/14/2017  . Pressure injury of skin 02/14/2017  . Cardiac arrest, cause unspecified ([place])   . Dehiscence of amputation stump ([place])   . Status post transmetatarsal amputation of left foot ([place]) 10/21/2016  . Status post transmetatarsal amputation of right foot ([place]) 10/08/2016  . 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 Diabetic osteomyelitis ([place])   . 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 Left foot infection 06/20/2016  . ESRD (end stage renal disease) ([place]) 06/20/2016  . Diabetic foot ulcer ([place]) 06/20/2016  . Chronic pain syndrome 02/24/2016  . Normal coronary arteries 10/12/2015  . NSTEMI- type 2, Troponin 11.2 05/11/2015  . Acute respiratory failure ([place]) 05/10/2015  . History of arthroscopy of right  shoulder-05/10/15 05/10/2015  . CKD (chronic kidney disease) stage 5, GFR less than 15 ml/min ([facility]) 09/21/2014  . Unspecified asthma(493.90) 10/25/2013  . Acute combined systolic and diastolic heart failure ([place]) 09/21/2013  . Non-ischemic cardiomyopathy - by echo 8/16- EF 35-40% 09/16/2013  . Morbid obesity-  09/16/2013  . Uncontrolled type 2 diabetes with renal manifestation ([place]) 09/15/2013  . Normocytic anemia 09/15/2013  . Lower extremity edema  09/15/2013  . Hyperlipidemia   . Hypertension     Palliative Care Plan    Recommendations/Plan:  Is Pass[place] muir valve appropriate?  Will add imodium as she is c-diff neg  Soft BP will defer to nephrology and primary teams  Swallow eval pending  PMT to follow up with family regarding code status   Prognosis:   Unable to determine   Discharge Planning:  To Be Determined  Care plan was discussed with patient's mother  Thank you for allowing the Palliative Medicine Team to assist in the care of this patient.  Total time spent:  15 min.     Greater than 50%  of this time was spent counseling and coordinating care related to the above assessment and plan.  Imogene Burn, PA-C Palliative Medicine  Please contact Palliative MedicineTeam phone at [phone] for questions and concerns between 7 am - 7 pm.   Please see AMION for individual provider pager numbers.

## 2017-03-24 NOTE — Progress Notes (Signed)
Patient's mother concerned about patients dialysis today. MD called to clarify when patient would have HD. Patient was dialyzed Monday (yesterday). MD stated that the patient would have HD on Wednesday and Saturday this week.

## 2017-03-24 NOTE — Progress Notes (Addendum)
PROGRESS NOTE        PATIENT DETAILS Name: [name] Age: 36 y.o. Sex: female Date of Birth: 1981/08/29 Admit Date: 02/14/2017 Admitting Physician Lady Deutscher, MD LHT:[id number], Helene Kelp, FNP  Note-care assumed by me on 6/18  Brief Narrative: Patient is a 36 y.o. female with history of ESRD on HD, poorly controlled diabetes, history of bilateral transmetatarsal foot amputation with subsequent left foot wound dehiscence (refused BKA as outpatient), chronic systolic heart failure felt to be secondary to nonischemic cardiomyopathy admitted on 02/14/17 with wound dehiscence and persistent drainage from the left trans-metatarsal amputation site, she was admitted to the [facility]. She was subsequently found to be unresponsive in her room on 5/19 after getting IV Dilaudid-she was  in asystole, CPR was started, with ROSC in around 9 minutes.She was then transferred to the intensive care unit, and subsequently underwent cooling and then rewarming. She was unable to be liberated off the ventilator, and as a result underwent tracheostomy on 5/31. Patient has been evaluated by neurology during this hospital stay, per neurology chances of meaningful neurological recovery is dismal. Hospital course has now been complicated by persistent leukocytosis, respiratory failure, and wound dehiscence with gangrene causing persistent SIRS pathophysiology. 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 On ATC for several days at 28%FIo2 .  PCCM following for resp issues -tracheostomy in place. Change to #4 cuffless trach .  Marland Kitchen S/P MBS 6/23, recommendation is NPO as the patient was too lethargic . FEES 6/27.  Holding off on PEG for now.   Patient pulled out her CoRTRAK  tube few days ago and it was replaced     Diarrhea, continues to have loose stools Continue probiotics,attempt to dc flexseal soon  Tachycardia-sinus tachycardia, occasional SVT, likely secondary to low-grade fever, low blood pressure, not sure if the patient will be a candidate for long-term hemodialysis  Cardiac arrest on 5/19 with anoxic brain injury:  She seems to be slowly improving on a daily basis , following all major commands. Evaluated by neurology on 5/29-felt to have poor overall prognoses for any meaningful recovery.     Systemic inflammatory response syndrome:/Persistent leukocytosis Continues to be intermittently febrile (low-grade), tachycardic-and has persistent leukocytosis white count slightly improved from 23.3-> 18.6 >20.5>18.8  . Likely secondary to left foot gangrene/infection and abscess-she is now status post AKA and still continues to have these symptoms. Blood cultures on 6/1, 6/13 continue to be negative. The repeat blood cultures 6/25 . Chest x-ray on 6/13 negative for pneumonia. Patient underwent CT chest on 6/24, which was negative for pneumonia, PE. Ruled out for C. difficile 2,   received multiple courses of antibiotics between 5/19-6/20,   She was on vancomycin and Tressie Ellis that was started on 6/14, and subsequently discontinued on 6/16 after AKA. Spoke with Dr Linus Salmons- who has evaluated this patient in the past-he recommends  vancomycin and Fortaz through 6/20, now completed .    Central line malfunctioning , removed     Left trans-metatarsal stump wound dehiscence with infection and gangrene: Per operative note-pus/abscess noted extending up to the knee when BKA was attempted, subsequently underwent a AKA. Note, initially family was  very reluctant to proceed with amputation, but given persistent leukocytosis/fever and potential risk of sepsis, family agreed for amputation, subsequently AKA was done on 6/15.  Orthopedics removed wound  VAC   ESRD: Nephrology following-HD on TTS.   Anemia: Hemoglobin 7.5-9.0  Likely secondary to chronic disease-probably worsened by acute illness. No signs of bleeding, hemoglobin has been stable around 8.2-8.5  DM-2: CBGs stable-continue with SSI.   Chronic systolic heart failure/nonischemic cardiomyopathy (EF 30-35% by TEE on 6/4): Volume status remained stable-this is managed with dialysis-continue with Coreg.   Pericardial effusion: Seen on TEE-with no evidence of tamponade pathophysiology. Repeat echo  6/26 to recheck EF and evaluate pericardial effusion  Moderate protein calorie malnutrition: Continue NG tube feedings  Hypokalemia-stable  Goals of care:  Prognosis is extremely guarded, need to make a decision about continuing hemodialysis   Telemetry (independently reviewed): Sinus tachycardia  Studies: Lower extremity ultrasound 5/18 >> no evidence of DVT CT head 5/19 >> normal exam Echo 5/20 >> EF 30-35%, increased LVF. Diffuse hypokinesis, akinesis of basilar mid-inferior myocardium, grade 2 diastolic dysfxn  EEG 2/69 >> finding c/w mod to severe global cerebral dysfxn. C/w anoxic injury given clinical course  LE [place] 5/21 >> negative MRI brain 5/24 >> ?thrombosed cortical vein, otherwise normal TEE 6/4 >> mild MR, normal AV, mild TR, mild PR, EF 30-35%, diffuse hypokinesis, no thrombus, no PFO, normal RV, moderate pericardial effusion with synechia suggesting some chronicity, no vegetations   Echo (reviewed):EF 30-35% on TEE done on 6/4  Morning labs/Imaging ordered: yes  DVT Prophylaxis: Prophylactic Heparin   Code Status: Full code   Family Communication: Mother at bedside, discussed on a daily basis  Disposition Plan: Remain inpatient in SDU  Antimicrobial agents: Anti-infectives    Start     Dose/Rate Route Frequency Ordered Stop   03/17/17 1800  cefTAZidime (FORTAZ) 2 g in dextrose 5 % 50 mL IVPB  Status:  Discontinued     2 g 100 mL/hr over 30 Minutes  Intravenous Every T-Th-Sa (1800) 03/15/17 1143 03/18/17 1223   03/17/17 1200  vancomycin (VANCOCIN) IVPB 1000 mg/200 mL premix  Status:  Discontinued     1,000 mg 200 mL/hr over 60 Minutes Intravenous Every T-Th-Sa (Hemodialysis) 03/15/17 1143 03/18/17 1223   03/14/17 1200  vancomycin (VANCOCIN) IVPB 1000 mg/200 mL premix  Status:  Discontinued     1,000 mg 200 mL/hr over 60 Minutes Intravenous Every T-Th-Sa (Hemodialysis) 03/12/17 0912 03/14/17 1542   03/13/17 1430  ceFAZolin (ANCEF) IVPB 2g/100 mL premix  Status:  Discontinued     2 g 200 mL/hr over 30 Minutes Intravenous To ShortStay Surgical 03/12/17 1208 03/13/17 1113   03/13/17 1115  ceFAZolin (ANCEF) IVPB 2g/100 mL premix     2 g 200 mL/hr over 30 Minutes Intravenous To Surgery 03/13/17 1108 03/13/17 1506   03/12/17 1800  cefTAZidime (FORTAZ) 2 g in dextrose 5 % 50 mL IVPB  Status:  Discontinued     2 g 100 mL/hr over 30 Minutes Intravenous Every T-Th-Sa (1800) 03/12/17 0912 03/14/17 1542   03/12/17 1200  vancomycin (VANCOCIN) 1,500 mg in sodium chloride 0.9 % 250 mL IVPB     1,500 mg 250 mL/hr over 60 Minutes Intravenous Every Thu (Hemodialysis) 03/12/17 0912 03/12/17 1356   03/02/17 1200  cefTRIAXone (ROCEPHIN) 2 g in dextrose 5 % 50 mL IVPB     2 g 100 mL/hr over 30 Minutes Intravenous Every 24 hours 03/02/17 0810 03/06/17 1358   03/02/17 1200  vancomycin (  VANCOCIN) IVPB 1000 mg/200 mL premix     1,000 mg 200 mL/hr over 60 Minutes Intravenous Every M-W-F (Hemodialysis) 03/02/17 0815 03/02/17 1457   03/02/17 1200  metroNIDAZOLE (FLAGYL) IVPB 500 mg     500 mg 100 mL/hr over 60 Minutes Intravenous Every 8 hours 03/02/17 0948 03/06/17 2230   02/26/17 1200  vancomycin (VANCOCIN) IVPB 1000 mg/200 mL premix     1,000 mg 200 mL/hr over 60 Minutes Intravenous Every T-Th-Sa (Hemodialysis) 02/25/17 1151 03/05/17 1325   02/25/17 1200  vancomycin (VANCOCIN) 2,000 mg in sodium chloride 0.9 % 500 mL IVPB     2,000 mg 250 mL/hr over 120  Minutes Intravenous  Once 02/25/17 1149 02/25/17 1449   02/25/17 0900  cefTRIAXone (ROCEPHIN) 2 g in dextrose 5 % 50 mL IVPB  Status:  Discontinued     2 g 100 mL/hr over 30 Minutes Intravenous Every 24 hours 02/25/17 0814 03/02/17 0810   02/25/17 0900  metroNIDAZOLE (FLAGYL) IVPB 500 mg  Status:  Discontinued     500 mg 100 mL/hr over 60 Minutes Intravenous Every 8 hours 02/25/17 0814 03/02/17 0948   02/20/17 1200  vancomycin (VANCOCIN) IVPB 1000 mg/200 mL premix  Status:  Discontinued     1,000 mg 200 mL/hr over 60 Minutes Intravenous Every M-W-F (Hemodialysis) 02/19/17 1506 02/20/17 1403   02/17/17 1800  meropenem (MERREM) 500 mg in sodium chloride 0.9 % 50 mL IVPB  Status:  Discontinued     500 mg 100 mL/hr over 30 Minutes Intravenous Daily-1800 02/17/17 1026 02/25/17 0814   02/17/17 1200  vancomycin (VANCOCIN) IVPB 1000 mg/200 mL premix     1,000 mg 200 mL/hr over 60 Minutes Intravenous Every T-Th-Sa (Hemodialysis) 02/14/17 1758 02/19/17 1612   02/14/17 2200  clindamycin (CLEOCIN) IVPB 900 mg  Status:  Discontinued     900 mg 100 mL/hr over 30 Minutes Intravenous Every 8 hours 02/14/17 1106 02/14/17 1928   02/14/17 2000  clindamycin (CLEOCIN) IVPB 900 mg  Status:  Discontinued     900 mg 100 mL/hr over 30 Minutes Intravenous Every 8 hours 02/14/17 1928 02/16/17 1047   02/14/17 1915  piperacillin-tazobactam (ZOSYN) IVPB 3.375 g  Status:  Discontinued     3.375 g 100 mL/hr over 30 Minutes Intravenous Every 12 hours 02/14/17 1801 02/17/17 1026   02/14/17 1845  vancomycin (VANCOCIN) 2,000 mg in sodium chloride 0.9 % 500 mL IVPB     2,000 mg 250 mL/hr over 120 Minutes Intravenous  Once 02/14/17 1758 02/14/17 2107   02/14/17 1115  clindamycin (CLEOCIN) IVPB 900 mg  Status:  Discontinued     900 mg 100 mL/hr over 30 Minutes Intravenous  Once 02/14/17 1106 02/14/17 2236   02/14/17 0945  clindamycin (CLEOCIN) IVPB 600 mg     600 mg 100 mL/hr over 30 Minutes Intravenous  Once 02/14/17  0931 02/14/17 1013      Procedures: ETT 5/19 >> 5/31 Lt IJ CVL 5/19 >> out Trach 5/31 >>  TEE 6/4 >> mild MR, normal AV, mild TR, mild PR, EF 30-35%, diffuse hypokinesis, no thrombus, no PFO, normal RV, moderate pericardial effusion with synechia suggesting some chronicity, no vegetations  Rt IJ TLC 6/13>> Left AKA 6/15>>  CONSULTS:  Infectious disease:  Neurology  Gastroenterology  Wound care  PCCM  Nephrology  Orthopedics  Time spent: 30 minutes-Greater than 50% of this time was spent in counseling, explanation of diagnosis, planning of further management, and coordination of care.  MEDICATIONS: Scheduled Meds: . calcium  carbonate (dosed in mg elemental calcium)  250 mg of elemental calcium Oral TID  . carvedilol  12.5 mg Oral BID WC  . chlorhexidine gluconate (MEDLINE KIT)  15 mL Mouth Rinse BID  . darbepoetin (ARANESP) injection - DIALYSIS  200 mcg Intravenous Q Tue-HD  . feeding supplement (NEPRO CARB STEADY)  1,000 mL Per Tube Q24H  . feeding supplement (PRO-STAT SUGAR FREE 64)  60 mL Per Tube QID  . [name]'s butt cream   Topical QID  . heparin  5,000 Units Subcutaneous Q8H  . insulin aspart  0-15 Units Subcutaneous Q4H  . mouth rinse  15 mL Mouth Rinse QID  . multivitamin  1 tablet Oral QHS  . pantoprazole sodium  40 mg Per Tube Q24H  . saccharomyces boulardii  250 mg Oral BID  . sodium chloride flush  10-40 mL Intracatheter Q12H   Continuous Infusions:  PRN Meds:.acetaminophen (TYLENOL) oral liquid 160 mg/5 mL, albuterol, fentaNYL (SUBLIMAZE) injection, loperamide, metoprolol tartrate, sodium chloride flush   PHYSICAL EXAM: Vital signs: Vitals:   03/24/17 0352 03/24/17 0744 03/24/17 1139 03/24/17 1233  BP:   118/79 (!) 83/53  Pulse:  (!) 110 96 98  Resp:  (!) 30 (!) 21 (!) 24  Temp:    (!) 96.5 F (35.8 C)  TempSrc:    Axillary  SpO2:  96% 96% 99%  Weight: 74 kg (163 lb 2.3 oz)     Height:       Filed Weights   03/23/17 0800 03/23/17 1213  03/24/17 0352  Weight: 80 kg (176 lb 5.9 oz) 78 kg (171 lb 15.3 oz) 74 kg (163 lb 2.3 oz)   Body mass index is 24.44 kg/m.  General appearance:Sleeping when I walked in-easily arouses, squeeze my hands. Eyes: No scleral icterus HEENT: Atraumatic and Normocephalic Neck: supple, tracheostomy in place Resp:Good air entry bilaterally, some transmitted upper airway sounds CVS: S1 S2 regular, tachycardic GI: Bowel sounds present, Non tender and not distended with no gaurding, rigidity or rebound. Extremities: Left AKA stump-vac in place. Musculoskeletal:No digital cyanosis  I have personally reviewed following labs and imaging studies  LABORATORY DATA: CBC:  Recent Labs Lab 03/19/17 0504 03/21/17 1430 03/22/17 0501 03/23/17 0303 03/24/17 0800  WBC 18.6* 19.4* 23.8* 20.5* 18.8*  HGB 8.2* 8.8* 8.7* 8.3* 9.5*  HCT 28.5* 30.7* 30.8* 28.8* 33.4*  MCV 99.0 99.0 100.0 99.7 99.7  PLT 438* 555* 483* 486* 441*    Basic Metabolic Panel:  Recent Labs Lab 03/19/17 0504 03/21/17 1430 03/22/17 0501 03/23/17 0303 03/24/17 0800  NA 140 143 144 144 140  K 3.8 4.2 4.5 4.3 4.1  CL 99* 99* 101 100* 94*  CO2 24 26 27 26 27   GLUCOSE 201* 206* 240* 196* 157*  BUN 83* 73* 89* 137* 71*  CREATININE 4.71* 4.99* 5.81* 7.30* 4.26*  CALCIUM 8.9 9.4 9.4 9.4 9.9  MG 2.2  --   --   --   --   PHOS  --  5.6*  --   --   --     GFR: Estimated Creatinine Clearance: 18.6 mL/min (A) (by C-G formula based on SCr of 4.26 mg/dL (H)).  Liver Function Tests:  Recent Labs Lab 03/19/17 0504 03/21/17 1430 03/22/17 0501 03/23/17 0303 03/24/17 0800  AST 24  --  26 27 34  ALT 7*  --  13* 13* 17  ALKPHOS 288*  --  326* 298* 350*  BILITOT 1.3*  --  1.5* 1.4* 1.5*  PROT 6.9  --  7.4 7.2 8.5*  ALBUMIN 2.3* 2.8* 2.8* 2.7* 3.9   No results for input(s): LIPASE, AMYLASE in the last 168 hours. No results for input(s): AMMONIA in the last 168 hours.  Coagulation Profile: No results for input(s): INR,  PROTIME in the last 168 hours.  Cardiac Enzymes:  Recent Labs Lab 03/21/17 1855  TROPONINI 0.04*    BNP (last 3 results) No results for input(s): PROBNP in the last 8760 hours.  HbA1C: No results for input(s): HGBA1C in the last 72 hours.  CBG:  Recent Labs Lab 03/23/17 1630 03/23/17 2202 03/24/17 0043 03/24/17 0346 03/24/17 1230  GLUCAP 128* 143* 209* 206* 186*    Lipid Profile: No results for input(s): CHOL, HDL, LDLCALC, TRIG, CHOLHDL, LDLDIRECT in the last 72 hours.  Thyroid Function Tests: No results for input(s): TSH, T4TOTAL, FREET4, T3FREE, THYROIDAB in the last 72 hours.  Anemia Panel: No results for input(s): VITAMINB12, FOLATE, FERRITIN, TIBC, IRON, RETICCTPCT in the last 72 hours.  Urine analysis:    Component Value Date/Time   COLORURINE AMBER (A) 02/14/2017 2111   APPEARANCEUR HAZY (A) 02/14/2017 2111   LABSPEC 1.015 02/14/2017 2111   PHURINE 5.0 02/14/2017 2111   GLUCOSEU NEGATIVE 02/14/2017 2111   HGBUR SMALL (A) 02/14/2017 2111   BILIRUBINUR NEGATIVE 02/14/2017 2111   KETONESUR NEGATIVE 02/14/2017 2111   PROTEINUR 30 (A) 02/14/2017 2111   UROBILINOGEN 0.2 09/21/2014 1908   NITRITE NEGATIVE 02/14/2017 2111   LEUKOCYTESUR TRACE (A) 02/14/2017 2111    Sepsis Labs: Lactic Acid, Venous    Component Value Date/Time   LATICACIDVEN 4.6 ([place]) 02/14/2017 1807    MICROBIOLOGY: Recent Results (from the past 240 hour(s))  C difficile quick scan w PCR reflex     Status: None   Collection Time: 03/19/17  9:00 AM  Result Value Ref Range Status   C Diff antigen NEGATIVE NEGATIVE Final   C Diff toxin NEGATIVE NEGATIVE Final   C Diff interpretation No C. difficile detected.  Final    RADIOLOGY STUDIES/RESULTS: Ct Abdomen Wo Contrast  Result Date: 03/02/2017 CLINICAL DATA:  Preop for gastrostomy to EXAM: CT ABDOMEN WITHOUT CONTRAST TECHNIQUE: Multidetector CT imaging of the abdomen was performed following the standard protocol without IV contrast.  COMPARISON:  None. FINDINGS: Lower chest: Bibasilar dependent and medial consolidation. Large pericardial effusion. Hepatobiliary: The left lobe of the liver is prominent and anterior to the stoma. The gallbladder is not clearly visualized. It may either be decompressed or absent due to cholecystectomy. Pancreas: Unremarkable Spleen: Unremarkable Adrenals/Urinary Tract: Kidneys and adrenal glands are within normal limits. Stomach/Bowel: The stomach is positioned deep to the liver and colon. Gastrostomy tube placement may be problematic. No evidence of small-bowel obstruction. Feeding tube tip is in the proximal duodenum. Vascular/Lymphatic: Small para-aortic lymph nodes. Atherosclerotic vascular calcifications are noted. No evidence of aortic aneurysm. Other: No free-fluid. Musculoskeletal: No vertebral compression deformity. IMPRESSION: The stomach is positioned deep to a prominent left lobe of the liver as well as the transverse colon. Gas is distension of the stomach and barium opacification of the transverse colon will be essential during the procedure. Bibasilar pulmonary consolidation. Large pericardial effusion. Electronically Signed   By: Marybelle Killings M.D.   On: 03/02/2017 07:18   Ct Angio Chest Pe W Or Wo Contrast  Result Date: 03/22/2017 CLINICAL DATA:  Tachycardia. EXAM: CT ANGIOGRAPHY CHEST WITH CONTRAST TECHNIQUE: Multidetector CT imaging of the chest was performed using the standard protocol during bolus administration of intravenous contrast. Multiplanar CT image reconstructions  and MIPs were obtained to evaluate the vascular anatomy. CONTRAST:  100 cc Isovue COMPARISON:  None. FINDINGS: Cardiovascular: No filling defects within the pulmonary arteries arteries to suggest acute pulmonary embolism. Mediastinum/Nodes: No axillary or supraclavicular adenopathy. No mediastinal adenopathy. Tracheostomy tube extends the mid trachea. Feeding tube extends the stomach esophagus. Lungs/Pleura: Mild basilar  atelectasis. No pleural effusion. No infiltrate or overt pulmonary edema. Upper Abdomen: Limited view of the liver, kidneys, pancreas are unremarkable. Normal adrenal glands. Musculoskeletal: No aggressive osseous lesion. Review of the MIP images confirms the above findings. IMPRESSION: 1. No pulmonary embolism. 2. Bibasilar atelectasis. 3. Tracheostomy tube appears in good position Electronically Signed   By: Suzy Bouchard M.D.   On: 03/22/2017 13:52   Ir Fluoro Guide Cv Line Right  Result Date: 03/11/2017 INDICATION: Osteomyelitis EXAM: TUNNELED RIGHT JUGULAR PICC LINE PLACEMENT WITH ULTRASOUND AND FLUOROSCOPIC GUIDANCE MEDICATIONS: None. ANESTHESIA/SEDATION: None FLUOROSCOPY TIME:  Fluoroscopy Time:  minutes 30 seconds (1 mGy). COMPLICATIONS: None immediate. PROCEDURE: The patient was advised of the possible risks and complications and agreed to undergo the procedure. The patient was then brought to the angiographic suite for the procedure. The right neck was prepped with chlorhexidine, draped in the usual sterile fashion using maximum barrier technique (cap and mask, sterile gown, sterile gloves, large sterile sheet, hand hygiene and cutaneous antiseptic). Local anesthesia was attained by infiltration with 1% lidocaine. Ultrasound demonstrated patency of the right jugular vein, and this was documented with an image. Under real-time ultrasound guidance, this vein was accessed with a 21 gauge micropuncture needle and image documentation was performed. A long subcutaneous tract was employed. The needle was exchanged over a guidewire for a peel-away sheath through which a 26 cm 5 [place] double lumen power injectable PICC was advanced, and positioned with its tip at the lower SVC/right atrial junction. The cuff was positioned in the subcutaneous tract. Fluoroscopy during the procedure and fluoro spot radiograph confirms appropriate catheter position. The catheter was flushed, secured to the skin with Prolene  sutures, and covered with a sterile dressing. IMPRESSION: Successful placement of a tunneled right jugular PICC with sonographic and fluoroscopic guidance. The catheter is ready for use. Electronically Signed   By: Marybelle Killings M.D.   On: 03/11/2017 15:31   Ir US Guide Vasc Access Right  Result Date: 03/11/2017 INDICATION: Osteomyelitis EXAM: TUNNELED RIGHT JUGULAR PICC LINE PLACEMENT WITH ULTRASOUND AND FLUOROSCOPIC GUIDANCE MEDICATIONS: None. ANESTHESIA/SEDATION: None FLUOROSCOPY TIME:  Fluoroscopy Time:  minutes 30 seconds (1 mGy). COMPLICATIONS: None immediate. PROCEDURE: The patient was advised of the possible risks and complications and agreed to undergo the procedure. The patient was then brought to the angiographic suite for the procedure. The right neck was prepped with chlorhexidine, draped in the usual sterile fashion using maximum barrier technique (cap and mask, sterile gown, sterile gloves, large sterile sheet, hand hygiene and cutaneous antiseptic). Local anesthesia was attained by infiltration with 1% lidocaine. Ultrasound demonstrated patency of the right jugular vein, and this was documented with an image. Under real-time ultrasound guidance, this vein was accessed with a 21 gauge micropuncture needle and image documentation was performed. A long subcutaneous tract was employed. The needle was exchanged over a guidewire for a peel-away sheath through which a 26 cm 5 [place] double lumen power injectable PICC was advanced, and positioned with its tip at the lower SVC/right atrial junction. The cuff was positioned in the subcutaneous tract. Fluoroscopy during the procedure and fluoro spot radiograph confirms appropriate catheter position. The catheter was flushed,  secured to the skin with Prolene sutures, and covered with a sterile dressing. IMPRESSION: Successful placement of a tunneled right jugular PICC with sonographic and fluoroscopic guidance. The catheter is ready for use. Electronically  Signed   By: Marybelle Killings M.D.   On: 03/11/2017 15:31   Dg Chest Port 1 View  Result Date: 03/21/2017 CLINICAL DATA:  Fever. EXAM: PORTABLE CHEST 1 VIEW COMPARISON:  March 17, 2017 FINDINGS: A tracheostomy tube is stable. A feeding tube terminates below today's film. A right-sided line terminates in the right atrium, unchanged. Stable cardiomegaly. No pneumothorax. No pulmonary nodules, masses, or focal infiltrates. IMPRESSION: Stable support apparatus. Cardiomegaly without overt edema. No change. Electronically Signed   By: Dorise Bullion III M.D   On: 03/21/2017 14:56   Dg Chest Port 1 View  Result Date: 03/17/2017 CLINICAL DATA:  Shortness of Breath EXAM: PORTABLE CHEST 1 VIEW COMPARISON:  03/11/2017 FINDINGS: Cardiac shadow is enlarged but stable. Tracheostomy tube, feeding catheter and right-sided jugular central line are noted. The lungs are poorly aerated but no focal infiltrate is seen. Mild crowding of the vascular markings is noted related to the poor inspiratory effort. No bony abnormality is seen. IMPRESSION: Stable cardiomegaly. Poor inspiratory effort although no focal infiltrate is seen. Electronically Signed   By: Inez Catalina M.D.   On: 03/17/2017 13:48   Dg Chest Port 1 View  Result Date: 03/11/2017 CLINICAL DATA:  Leukocytosis. EXAM: PORTABLE CHEST 1 VIEW COMPARISON:  03/03/2017 FINDINGS: Patient is rotated to the left. The cardio pericardial silhouette is enlarged. Slight improvement in left base aeration. Tracheostomy tube remains in place. A feeding tube passes into the stomach although the distal tip position is not included on the film. The visualized bony structures of the thorax are intact. Telemetry leads overlie the chest. IMPRESSION: Persistent enlargement of the cardiopericardial silhouette without evidence for overt pulmonary edema or substantial pleural effusion. Electronically Signed   By: Misty Stanley M.D.   On: 03/11/2017 08:37   Dg Chest Port 1 View  Result Date:  03/03/2017 CLINICAL DATA:  Respiratory failure. EXAM: PORTABLE CHEST 1 VIEW COMPARISON:  03/01/2017 . FINDINGS: Tracheostomy tube and feeding tube in stable position. Cardiomegaly with mild diffuse interstitial prominence suggesting mild CHF. Persistent atelectasis and consolidation left lower lobe. Small left pleural effusion cannot be excluded. No pneumothorax. IMPRESSION: 1. Tracheostomy tube and feeding tube in stable position. 2. Cardiomegaly with diffuse mild interstitial prominence suggesting mild CHF. 3. Persistent left lower lobe atelectasis and consolidation. Small left pleural effusion cannot be excluded . Electronically Signed   By: Marcello Moores  Register   On: 03/03/2017 07:09   Dg Chest Port 1 View  Result Date: 03/01/2017 CLINICAL DATA:  Respiratory failure EXAM: PORTABLE CHEST 1 VIEW COMPARISON:  02/27/2017 FINDINGS: 0546 hours. Patient rotated to the left. Tracheostomy tube remains in place. A feeding tube passes into the stomach although the distal tip position is not included on the film. The cardio pericardial silhouette is enlarged. Left base collapse/consolidation again noted. There is pulmonary vascular congestion without overt pulmonary edema. IMPRESSION: Rotated film with cardiomegaly and vascular congestion. Persistent left base collapse/ consolidation. Electronically Signed   By: Misty Stanley M.D.   On: 03/01/2017 07:43   Dg Chest Port 1 View  Result Date: 02/27/2017 CLINICAL DATA:  Hypoxia. EXAM: PORTABLE CHEST 1 VIEW COMPARISON:  02/26/2017. FINDINGS: Tracheostomy to in stable position. Stable cardiomegaly. Low lung volumes. Progressive left lower lobe atelectasis and infiltrate. Small left pleural effusion cannot be excluded .  IMPRESSION: 1. Tracheostomy tube in stable position. 2. Progressive left lower lobe atelectasis and infiltrate. Small left pleural effusion cannot be excluded . Electronically Signed   By: Marcello Moores  Register   On: 02/27/2017 07:01   Dg Chest Port 1 View  Result  Date: 02/26/2017 CLINICAL DATA:  Status post tracheostomy placement. EXAM: PORTABLE CHEST 1 VIEW COMPARISON:  Earlier today. FINDINGS: The endotracheal tube has been removed and replaced with a tracheostomy tube in satisfactory position. The nasogastric tube and esophageal probe have been removed. The left jugular catheter has been removed. No pneumothorax. Grossly stable enlarged cardiac silhouette. Left lower lobe opacity with improvement laterally since 02/25/2017. Clear right lung. Unremarkable bones. IMPRESSION: 1. Tracheostomy tube in satisfactory position. 2. Left lower lobe atelectasis or pneumonia with mild improvement since 2 days ago. 3. Stable cardiomegaly. Electronically Signed   By: Claudie Revering M.D.   On: 02/26/2017 16:02   Dg Chest Port 1 View  Result Date: 02/26/2017 CLINICAL DATA:  Hypoxia.  Shortness of breath. EXAM: PORTABLE CHEST 1 VIEW COMPARISON:  02/25/2017. FINDINGS: Endotracheal tube, NG tube, left IJ line esophageal probe in stable position. Cardiomegaly with bilateral pulmonary interstitial infiltrates consistent with CHF. Left base atelectasis . Small left pleural effusion. Similar findings noted on prior exam. No pneumothorax. IMPRESSION: 1. Lines and tubes in stable position. 2. Cardiomegaly with bilateral pulmonary interstitial prominence and small left pleural effusion noted consistent CHF. Left base atelectasis. Similar findings noted on prior exam. Electronically Signed   By: [place]   On: 02/26/2017 07:18   Dg Chest Port 1 View  Result Date: 02/25/2017 CLINICAL DATA:  Respiratory failure, intubated patient. Diabetes, cardiomyopathy, morbid obesity, end-stage renal disease. EXAM: PORTABLE CHEST 1 VIEW COMPARISON:  Portable chest x-ray of Feb 24, 2017 FINDINGS: The lungs are adequately inflated. The pulmonary interstitial markings are slightly less prominent today. The pulmonary vascularity remains engorged. The cardiac silhouette remains enlarged. Retrocardiac  region on the left is slightly less dense. The endotracheal tube tip lies 5.7 cm above the carina. The esophagogastric tube tip projects below the inferior margin of the image. The left internal jugular venous catheter tip projects over the proximal SVC. IMPRESSION: Slight interval improvement in pulmonary interstitial edema. Stable cardiomegaly. Persistent left lower lobe atelectasis or pneumonia. The support tubes are in reasonable position. Electronically Signed   By: David  [place] M.D.   On: 02/25/2017 07:57   Dg Chest Port 1 View  Result Date: 02/24/2017 CLINICAL DATA:  Respiratory failure EXAM: PORTABLE CHEST 1 VIEW COMPARISON:  Two days ago FINDINGS: Endotracheal tube tip at the clavicular heads. An orogastric tube reaches the stomach. Esophageal thermistor. Left IJ central line with tip at the SVC level. Cardiomegaly and diffuse hazy opacity with cephalized blood flow. No definitive effusion. No pneumothorax. IMPRESSION: 1. Stable positioning of tubes and central line. 2. CHF pattern. Electronically Signed   By: Monte Fantasia M.D.   On: 02/24/2017 07:31   Dg Abd Portable 1v  Result Date: 03/02/2017 CLINICAL DATA:  Encounter for feeding tube placement EXAM: PORTABLE ABDOMEN - 1 VIEW COMPARISON:  Portable exam 1712 hours compared to CT abdomen and pelvis of 03/01/2017 FINDINGS: Feeding tube traverses abdomen with tip projecting over distal antrum near pylorus. Cardiac silhouette appears enlarged. Visualized bowel gas pattern normal. Osseous structures unremarkable. IMPRESSION: Tip of feeding tube projects over distal gastric antrum near pylorus. Electronically Signed   By: Lavonia Dana M.D.   On: 03/02/2017 17:21   Dg Abd Portable 1v  Result  Date: 02/27/2017 CLINICAL DATA:  Feeding tube placement EXAM: PORTABLE ABDOMEN - 1 VIEW COMPARISON:  None. FINDINGS: Feeding tube with the tip projecting over the antrum of the stomach. There is no bowel dilatation to suggest obstruction. There is no evidence of  pneumoperitoneum, portal venous gas or pneumatosis. There are no pathologic calcifications along the expected course of the ureters. The osseous structures are unremarkable. IMPRESSION: Feeding tube with the tip projecting over the antrum of the stomach. Electronically Signed   By: Kathreen Devoid   On: 02/27/2017 11:05   Dg Swallowing Func-speech Pathology  Result Date: 03/21/2017 Objective Swallowing Evaluation: Type of Study: MBS-Modified Barium Swallow Study Patient Details Name: Dezi Brauner MRN: [id number] Date of Birth: Nov 28, 1980 Today's Date: 03/21/2017 Time: SLP Start Time (ACUTE ONLY): 0905-SLP Stop Time (ACUTE ONLY): 0915 SLP Time Calculation (min) (ACUTE ONLY): 10 min Past Medical History: Past Medical History: Diagnosis Date . Anemia  . Arthritis   "left hand" (09/15/2013) . Asthma  . CHF (congestive heart failure) ([place])  . Chronic bronchitis ([place])   "just about q yr" (09/15/2013) . Chronic kidney disease   "low kidney function" (09/15/2013) ,  T/Th/Sa . COPD (chronic obstructive pulmonary disease) ([place])  . Coronary artery disease  . Hyperlipidemia  . Hypertension  . Migraine   "get them alot" (09/15/2013) . Myocardial infarction [facility]) 04/2015  NSTEMI . Normal coronary arteries   by cardiac catheterization 09/20/13 . Peripheral vascular disease ([place])  . Pneumonia   "couple times; have it now" (09/15/2013) . PONV (postoperative nausea and vomiting)  . Restless legs  . Shortness of breath   "just recently; related to the pneumonia" (09/15/2013) . Type 1 diabetes mellitus ([place])   type 2 Past Surgical History: Past Surgical History: Procedure Laterality Date . AMPUTATION Right 06/25/2016  Procedure: Amputation Right Great Toe at the Metatarsophalangeal Joint;  Surgeon: Newt Minion, MD;  Location: [place];  Service: Orthopedics;  Laterality: Right; . AMPUTATION Bilateral 10/08/2016  Procedure: Bilateral Transmetatarsal Amputation;  Surgeon: Newt Minion, MD;  Location: [place];  Service: Orthopedics;  Laterality:  Bilateral; . AMPUTATION Left 03/13/2017  Procedure: AMPUTATION ABOVE KNEE;  Surgeon: Newt Minion, MD;  Location: [place];  Service: Orthopedics;  Laterality: Left; . AV FISTULA PLACEMENT Left 03/27/2015  Procedure: CREATION RADIAL CEPHALIC ARTERIOVENOUS FISTULA;  Surgeon: Angelia Mould, MD;  Location: [place];  Service: Vascular;  Laterality: Left; . AV FISTULA PLACEMENT Left 11/23/2015  Procedure:  LEFT ARM BASILIC VEIN TRANSPOSITION  ;  Surgeon: Angelia Mould, MD;  Location: [place];  Service: Vascular;  Laterality: Left; . CESAREAN SECTION  1999; 2006 . CORONARY ANGIOGRAM  09/20/2013  Procedure: CORONARY ANGIOGRAM;  Surgeon: Lorretta Harp, MD;  Location: [facility] CATH LAB;  Service: Cardiovascular;; . FINGER SURGERY Left 1985  3rd and 4th digits reconstructed after cut off" (09/15/2013) . IR FLUORO GUIDE CV LINE RIGHT  03/11/2017 . IR US GUIDE VASC ACCESS RIGHT  03/11/2017 . PERIPHERAL VASCULAR CATHETERIZATION N/A 09/05/2016  Procedure: Abdominal Aortogram;  Surgeon: Elam Dutch, MD;  Location: [place] CV LAB;  Service: Cardiovascular;  Laterality: N/A; . PERIPHERAL VASCULAR CATHETERIZATION Bilateral 09/05/2016  Procedure: Lower Extremity Angiography;  Surgeon: Elam Dutch, MD;  Location: [place] CV LAB;  Service: Cardiovascular;  Laterality: Bilateral; . PERIPHERAL VASCULAR CATHETERIZATION Right 09/05/2016  Procedure: Peripheral Vascular Balloon Angioplasty;  Surgeon: Elam Dutch, MD;  Location: [place] CV LAB;  Service: Cardiovascular;  Laterality: Right;  peroneal and AT . SHOULDER ARTHROSCOPY  WITH BICEPSTENOTOMY Right 05/10/2015  Procedure: RIGHT SHOULDER ARTHROSCOPY WITH BICEPS TENOTOMY, DEBRIDEMENT LABRAL TEAR;  Surgeon: Tania Ade, MD;  Location: [place];  Service: Orthopedics;  Laterality: Right;  Right shoulder arthroscopy biceps tenotomy, debridement labral tear . STUMP REVISION Left 01/21/2017  Procedure: . Revision Left Transmetatarsal Amputation;  Surgeon: Newt Minion,  MD;  Location: [place];  Service: Orthopedics;  Laterality: Left; . TONSILLECTOMY  1997 . 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 ETT 5/19 and trach'd 5/31. Hospital course included cardiac arrest, transfer to ICU No Data Recorded Assessment / Plan / Recommendation CHL IP CLINICAL IMPRESSIONS 03/21/2017 Clinical Impression Unfortunately pts arousal was a major barrier to success on this exam. Pt fully alert during bedside eval the preceeding day, and awake but drowsy on arrival to radiology. When she was transferred to the chair and positioned, pt could not appropriately sustain arousal without max cues. SLP was able to provide stimuli and tactile cueing for one bite of puree. Pt orally transited this eventually and triggered a weak, ineffective swallow leaving the majority of the bolus in pharynx. With max cues and painful stimuli SLP was able to cue pt to trigger two more swallows to clear bolus from pharynx. At that time the study was discontinued due to risk to pt and inability to progress further. This is likely not indicative of pts function and there is potential for ability to consume PO, but pt will need to be fully alert and participatory. Will follow for further attempts.  SLP Visit Diagnosis Dysphagia, unspecified (R13.10) Attention and concentration deficit following -- Frontal lobe and executive function deficit following -- Impact on safety and function Severe aspiration risk   CHL IP TREATMENT RECOMMENDATION 03/21/2017 Treatment Recommendations Therapy as outlined in treatment plan below   No flowsheet data found. CHL IP DIET RECOMMENDATION 03/21/2017 SLP Diet Recommendations NPO;Alternative means  - temporary Liquid Administration via -- Medication Administration -- Compensations -- Postural Changes --   No flowsheet data found.  CHL IP FOLLOW UP RECOMMENDATIONS 03/21/2017 Follow up Recommendations Skilled Nursing facility   [facility] IP FREQUENCY AND DURATION 03/21/2017 Speech Therapy Frequency (ACUTE ONLY) min 2x/week Treatment Duration 2 weeks      CHL IP ORAL PHASE 03/21/2017 Oral Phase Impaired Oral - Pudding Teaspoon -- Oral - Pudding Cup -- Oral - Honey Teaspoon -- Oral - Honey Cup -- Oral - Nectar Teaspoon -- Oral - Nectar Cup -- Oral - Nectar Straw -- Oral - Thin Teaspoon -- Oral - Thin Cup -- Oral - Thin Straw -- Oral - Puree Delayed oral transit;Reduced posterior propulsion Oral - Mech Soft -- Oral - Regular -- Oral - Multi-Consistency -- Oral - Pill -- Oral Phase - Comment --  CHL IP PHARYNGEAL PHASE 03/21/2017 Pharyngeal Phase Impaired Pharyngeal- Pudding Teaspoon -- Pharyngeal -- Pharyngeal- Pudding Cup -- Pharyngeal -- Pharyngeal- Honey Teaspoon -- Pharyngeal -- Pharyngeal- Honey Cup -- Pharyngeal -- Pharyngeal- Nectar Teaspoon -- Pharyngeal -- Pharyngeal- Nectar Cup -- Pharyngeal -- Pharyngeal- Nectar Straw -- Pharyngeal -- Pharyngeal- Thin Teaspoon -- Pharyngeal -- Pharyngeal- Thin Cup -- Pharyngeal -- Pharyngeal- Thin Straw -- Pharyngeal -- Pharyngeal- Puree Reduced laryngeal elevation;Reduced tongue base retraction;Reduced pharyngeal  peristalsis;Reduced epiglottic inversion;Delayed swallow initiation-vallecula;Pharyngeal residue - valleculae;Pharyngeal residue - pyriform Pharyngeal -- Pharyngeal- Mechanical Soft -- Pharyngeal -- Pharyngeal- Regular -- Pharyngeal -- Pharyngeal- Multi-consistency -- Pharyngeal -- Pharyngeal- Pill -- Pharyngeal -- Pharyngeal Comment --  No flowsheet data found. No flowsheet data found. [place], Katherene Ponto 03/21/2017, 9:55 AM                LOS: 38 days   Reyne Dumas, MD  [facility] Pager: [phone]  If 7PM-7AM, please contact  night-coverage [email or web address] Password [email or web address] 03/24/2017, 2:25 PM

## 2017-03-24 NOTE — Progress Notes (Signed)
Central line red port unable to pull blood back. Purple port flushes and draws back fine. IV team consulted for TPA. TPA attempt was unsuccessful. MD Abrol made aware. Order given to D/C central line.

## 2017-03-24 NOTE — Progress Notes (Signed)
  Speech Language Pathology  Patient Details Name: [name] MRN: [id number] DOB: 09-28-1981 Today's Date: 03/24/2017 Time:  -     Noted order for discontinue swallow orders stating "family says they have not seen speech therapy despite mother and pt being in the room." Most recent progress note 6/25, SLP spoke with mom and decided to hold FEES until Wed when she does not have HD. Pt was seen and/or attempted 3 times last week. Dr. Susie Cassette called (thought she wrote order) who stated she did not write d/c order but she would order for FEES due to plan discussed yesterday with mom. FEES to be completed tomorrow (a non HD day).                   [name] 03/24/2017, 10:49 AM  [name] [name].Ed [employer] [phone]

## 2017-03-24 NOTE — Progress Notes (Signed)
RN unable to draw blood for 5AM labs on 03/24/17. Phlebotomy called and asked to draw labs.

## 2017-03-24 NOTE — Progress Notes (Signed)
Changed PT trach set up and water bottle 

## 2017-03-25 ENCOUNTER — Inpatient Hospital Stay (HOSPITAL_COMMUNITY): Payer: Medicaid Other

## 2017-03-25 LAB — GLUCOSE, CAPILLARY
GLUCOSE-CAPILLARY: 138 mg/dL — AB (ref 65–99)
GLUCOSE-CAPILLARY: 166 mg/dL — AB (ref 65–99)
GLUCOSE-CAPILLARY: 184 mg/dL — AB (ref 65–99)
GLUCOSE-CAPILLARY: 184 mg/dL — AB (ref 65–99)
Glucose-Capillary: 191 mg/dL — ABNORMAL HIGH (ref 65–99)
Glucose-Capillary: 240 mg/dL — ABNORMAL HIGH (ref 65–99)

## 2017-03-25 MED ORDER — DARBEPOETIN ALFA 200 MCG/0.4ML IJ SOSY
PREFILLED_SYRINGE | INTRAMUSCULAR | Status: AC
  Filled 2017-03-25: qty 0.4

## 2017-03-25 MED ORDER — DARBEPOETIN ALFA 200 MCG/0.4ML IJ SOSY
200.0000 ug | PREFILLED_SYRINGE | INTRAMUSCULAR | Status: DC
  Administered 2017-04-02 – 2017-04-30 (×5): 200 ug via INTRAVENOUS
  Filled 2017-03-25 (×5): qty 0.4

## 2017-03-25 MED ORDER — LIDOCAINE HCL (PF) 1 % IJ SOLN: 5.0000 mL | INTRAMUSCULAR | Status: DC | PRN

## 2017-03-25 MED ORDER — LIDOCAINE-PRILOCAINE 2.5-2.5 % EX CREA: 1.0000 "application " | TOPICAL_CREAM | CUTANEOUS | Status: DC | PRN

## 2017-03-25 MED ORDER — SODIUM CHLORIDE 0.9 % IV SOLN: 100.0000 mL | INTRAVENOUS | Status: DC | PRN

## 2017-03-25 MED ORDER — HEPARIN SODIUM (PORCINE) 1000 UNIT/ML DIALYSIS
1000.0000 [IU] | INTRAMUSCULAR | Status: DC | PRN
  Filled 2017-03-25: qty 1

## 2017-03-25 MED ORDER — DARBEPOETIN ALFA 200 MCG/0.4ML IJ SOSY
200.0000 ug | PREFILLED_SYRINGE | INTRAMUSCULAR | Status: AC
  Administered 2017-03-25: 200 ug via INTRAVENOUS
  Filled 2017-03-25: qty 0.4

## 2017-03-25 MED ORDER — HEPARIN SODIUM (PORCINE) 1000 UNIT/ML DIALYSIS
2000.0000 [IU] | INTRAMUSCULAR | Status: DC | PRN
  Administered 2017-03-25: 2000 [IU] via INTRAVENOUS_CENTRAL
  Filled 2017-03-25 (×2): qty 2

## 2017-03-25 MED ORDER — PENTAFLUOROPROP-TETRAFLUOROETH EX AERO: 1.0000 "application " | INHALATION_SPRAY | CUTANEOUS | Status: DC | PRN

## 2017-03-25 MED ORDER — ALTEPLASE 2 MG IJ SOLR
2.0000 mg | Freq: Once | INTRAMUSCULAR | Status: DC | PRN
  Filled 2017-03-25: qty 2

## 2017-03-25 NOTE — Progress Notes (Signed)
  Speech Language Pathology Treatment: [name] Speaking valve  Patient Details Name: [name] MRN: [id number] DOB: July 29, 1981 Today's Date: 03/25/2017 Time: 0840-0900 SLP Time Calculation (min) (ACUTE ONLY): 20 min  Assessment / Plan / Recommendation Clinical Impression  Patient seen for PMSV trials in preparation for and during FEES. [name] now changed to 4.0 cuffless shiley. Patient now able to tolerate PMV in place for 30 minutes without change in vital signs, evidence of distress, or indication of air trapping with valve removal. Positive direction of air through mouth and nose immediately noted. With verbal and visual cueing for maximum effort, patient able to blow air out of mouth for movement of tissue, further indicating intact upper airway. Intact glottal closure noted during spontaneous cough. Minimal, low intensity but clear, phonatory output noted with max verbal and visual cueing for sustained vowel sound, otherwise patient mouthing words but without audible speech, suspect a result of cognitive deficits and decreased breath support. Valve worn during entire FEES without difficulty. Removed following session. Mom present and educated regard  Performance and plan .    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 ETT 5/19 and trach'd 5/31. Hospital course included cardiac arrest, transfer to ICU      SLP Plan  Continue with current plan of care       Recommendations         Patient may use Passy-Muir Speech Valve: Intermittently with supervision;During PO intake/meals PMSV Supervision: Full         Follow up Recommendations: Skilled Nursing facility SLP Visit Diagnosis: Aphonia  (R49.1) Plan: Continue with current plan of care       GO          [facility] MA, CCC-SLP [phone]       [name] 03/25/2017, 9:35 AM

## 2017-03-25 NOTE — Progress Notes (Signed)
Placed PT on new ATC set up- 28%.

## 2017-03-25 NOTE — Procedures (Signed)
Objective Swallowing Evaluation: Type of Study: FEES-Fiberoptic Endoscopic Evaluation of Swallow  Patient Details  Name: [name] MRN: [id number] Date of Birth: 05/25/1981  Today's Date: 03/25/2017 Time: SLP Start Time (ACUTE ONLY): 0900-SLP Stop Time (ACUTE ONLY): 0930 SLP Time Calculation (min) (ACUTE ONLY): 30 min  Past Medical History:  Past Medical History:  Diagnosis Date  . Anemia   . Arthritis    "left hand" (09/15/2013)  . Asthma   . CHF (congestive heart failure) (HCC)   . Chronic bronchitis (HCC)    "just about q yr" (09/15/2013)  . Chronic kidney disease    "low kidney function" (09/15/2013) ,  T/Th/Sa  . COPD (chronic obstructive pulmonary disease) (HCC)   . Coronary artery disease   . Hyperlipidemia   . Hypertension   . Migraine    "get them alot" (09/15/2013)  . Myocardial infarction [facility]) 04/2015   NSTEMI  . Normal coronary arteries    by cardiac catheterization 09/20/13  . Peripheral vascular disease (HCC)   . Pneumonia    "couple times; have it now" (09/15/2013)  . PONV (postoperative nausea and vomiting)   . Restless legs   . Shortness of breath    "just recently; related to the pneumonia" (09/15/2013)  . Type 1 diabetes mellitus (HCC)    type 2   Past Surgical History:  Past Surgical History:  Procedure Laterality Date  . AMPUTATION Right 06/25/2016   Procedure: Amputation Right Great Toe at the Metatarsophalangeal Joint;  Surgeon: Nadara Mustard, MD;  Location: [facility] OR;  Service: Orthopedics;  Laterality: Right;  . AMPUTATION Bilateral 10/08/2016   Procedure: Bilateral Transmetatarsal Amputation;  Surgeon: Nadara Mustard, MD;  Location: [facility] OR;  Service: Orthopedics;  Laterality: Bilateral;  . AMPUTATION Left 03/13/2017   Procedure: AMPUTATION ABOVE KNEE;  Surgeon: Nadara Mustard, MD;  Location: [facility] OR;  Service: Orthopedics;  Laterality: Left;  . AV FISTULA PLACEMENT Left 03/27/2015   Procedure: CREATION RADIAL CEPHALIC ARTERIOVENOUS FISTULA;  Surgeon:  Chuck Hint, MD;  Location: [facility] OR;  Service: Vascular;  Laterality: Left;  . AV FISTULA PLACEMENT Left 11/23/2015   Procedure:  LEFT ARM BASILIC VEIN TRANSPOSITION  ;  Surgeon: Chuck Hint, MD;  Location: [facility] OR;  Service: Vascular;  Laterality: Left;  . CESAREAN SECTION  1999; 2006  . CORONARY ANGIOGRAM  09/20/2013   Procedure: CORONARY ANGIOGRAM;  Surgeon: Runell Gess, MD;  Location: [facility] CATH LAB;  Service: Cardiovascular;;  . FINGER SURGERY Left 1985   3rd and 4th digits reconstructed after cut off" (09/15/2013)  . IR FLUORO GUIDE CV LINE RIGHT  03/11/2017  . IR US GUIDE VASC ACCESS RIGHT  03/11/2017  . PERIPHERAL VASCULAR CATHETERIZATION N/A 09/05/2016   Procedure: Abdominal Aortogram;  Surgeon: Sherren Kerns, MD;  Location: [facility] INVASIVE CV LAB;  Service: Cardiovascular;  Laterality: N/A;  . PERIPHERAL VASCULAR CATHETERIZATION Bilateral 09/05/2016   Procedure: Lower Extremity Angiography;  Surgeon: Sherren Kerns, MD;  Location: [facility] INVASIVE CV LAB;  Service: Cardiovascular;  Laterality: Bilateral;  . PERIPHERAL VASCULAR CATHETERIZATION Right 09/05/2016   Procedure: Peripheral Vascular Balloon Angioplasty;  Surgeon: Sherren Kerns, MD;  Location: [facility] INVASIVE CV LAB;  Service: Cardiovascular;  Laterality: Right;  peroneal and AT  . SHOULDER ARTHROSCOPY WITH BICEPSTENOTOMY Right 05/10/2015   Procedure: RIGHT SHOULDER ARTHROSCOPY WITH BICEPS TENOTOMY, DEBRIDEMENT LABRAL TEAR;  Surgeon: Jones Broom, MD;  Location: [facility] OR;  Service: Orthopedics;  Laterality: Right;  Right shoulder arthroscopy biceps tenotomy, debridement labral tear  .  STUMP REVISION Left 01/21/2017   Procedure: . Revision Left Transmetatarsal Amputation;  Surgeon: Nadara Mustard, MD;  Location: [facility] OR;  Service: Orthopedics;  Laterality: Left;  . TONSILLECTOMY  1997  . 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 ETT 5/19 and trach'd 5/31. Hospital course included cardiac arrest, transfer to ICU  No Data Recorded   Assessment / Plan / Recommendation  CHL IP CLINICAL IMPRESSIONS 03/25/2017  Clinical Impression FEES complete. Patient with fluctuating periods of lethargy and restlessness/agitation, pulling at scope/bit clinician finger, but in general more alert and able to cooperate in exam as compared to previous MBS 6.23. Oral phase WFL. Patient presents with a moderate pharyngeal phase dysphagia characterized by weakness and decreased laryngeal closure resulting in penetration of thin liquids and mild-moderate post swallow residuals (primarily in vallecula). Patient able to fully protect her airway with solids  and nectar thick liquids. Aspiration risk increased with soft solids at this time as patient unaware of residuals, which occassionally move towards the laryngeal vestibule post swallow. Will initiate a conservative diet and f/u closely to monitor for tolerance and potential to advance. Note that PMV in place for entire exam.   SLP Visit Diagnosis Dysphagia, pharyngeal phase (R13.13)  Attention and concentration deficit following --  Frontal lobe and executive function deficit following --  Impact on safety and function Moderate aspiration risk      CHL IP TREATMENT RECOMMENDATION 03/25/2017  Treatment Recommendations Therapy as outlined in treatment plan below     Prognosis 03/25/2017  Prognosis for Safe Diet Advancement Good  Barriers to Reach Goals Cognitive deficits  Barriers/Prognosis Comment --    CHL IP DIET RECOMMENDATION 03/25/2017  SLP Diet Recommendations Dysphagia 1 (Puree) solids;Nectar thick liquid  Liquid Administration via Cup;No straw  Medication Administration  Crushed with puree  Compensations Slow rate;Small sips/bites  Postural Changes Seated upright at 90 degrees      CHL IP OTHER RECOMMENDATIONS 03/25/2017  Recommended Consults --  Oral Care Recommendations Oral care BID  Other Recommendations Order thickener from pharmacy;Prohibited food (jello, ice cream, thin soups);Remove water pitcher      CHL IP FOLLOW UP RECOMMENDATIONS 03/25/2017  Follow up Recommendations Skilled Nursing facility      [facility] IP FREQUENCY AND DURATION 03/25/2017  Speech Therapy Frequency (ACUTE ONLY) min 3x week  Treatment Duration 2 weeks           CHL IP ORAL PHASE 03/25/2017  Oral Phase WFL  Oral - Pudding Teaspoon --  Oral - Pudding Cup --  Oral - Honey Teaspoon --  Oral - Honey Cup --  Oral - Nectar Teaspoon --  Oral - Nectar Cup --  Oral - Nectar Straw --  Oral - Thin Teaspoon --  Oral - Thin Cup --  Oral - Thin Straw --  Oral - Puree --  Oral - Mech Soft --  Oral - Regular --  Oral - Multi-Consistency --  Oral - Pill --  Oral Phase - Comment --    CHL IP PHARYNGEAL PHASE 03/25/2017  Pharyngeal Phase Impaired  Pharyngeal- Pudding Teaspoon --  Pharyngeal --  Pharyngeal- Pudding Cup --  Pharyngeal --  Pharyngeal- Honey Teaspoon --  Pharyngeal --  Pharyngeal- Honey Cup --  Pharyngeal --  Pharyngeal- Nectar Teaspoon Delayed swallow initiation-vallecula;Reduced tongue base retraction;Pharyngeal residue - valleculae  Pharyngeal --  Pharyngeal- Nectar Cup Delayed swallow initiation-vallecula;Reduced tongue base retraction;Pharyngeal residue - valleculae  Pharyngeal --  Pharyngeal- Nectar Straw --  Pharyngeal --  Pharyngeal- Thin Teaspoon Delayed swallow initiation-vallecula;Reduced tongue base retraction;Penetration/Aspiration during swallow  Pharyngeal Material enters airway, remains ABOVE vocal cords and not ejected out  Pharyngeal- Thin Cup Delayed swallow initiation-vallecula;Reduced tongue base retraction;Penetration/Aspiration during  swallow  Pharyngeal Material enters airway, remains ABOVE vocal cords and not ejected out  Pharyngeal- Thin Straw --  Pharyngeal --  Pharyngeal- Puree Delayed swallow initiation-vallecula;Pharyngeal residue - valleculae;Pharyngeal residue - pyriform;Reduced tongue base retraction  Pharyngeal --  Pharyngeal- Mechanical Soft Delayed swallow initiation-vallecula;Reduced tongue base retraction;Pharyngeal residue - valleculae  Pharyngeal --  Pharyngeal- Regular --  Pharyngeal --  Pharyngeal- Multi-consistency --  Pharyngeal --  Pharyngeal- Pill --  Pharyngeal --  Pharyngeal Comment --    [name] MA, CCC-SLP [phone]   Nadim Malia Meryl 03/25/2017, 9:47 AM

## 2017-03-25 NOTE — Progress Notes (Signed)
Subjective:   Doing well, per staff she passed her FEES swallow eval and now has a cuffless trach and a lower size.     Objective Vital signs in last 24 hours: Vitals:   03/25/17 0454 03/25/17 0800 03/25/17 0947 03/25/17 1000  BP:  95/68 93/61   Pulse:  (!) 102  (!) 107  Resp:  (!) 22  (!) 24  Temp:  98.2 F (36.8 C) (!) 96.8 F (36 C)   TempSrc:  Oral Axillary   SpO2:  98%  100%  Weight: 73 kg (160 lb 15 oz)     Height:       Weight change: -7 kg (-15 lb 6.9 oz)  Intake/Output Summary (Last 24 hours) at 03/25/17 1105 Last data filed at 03/25/17 0600  Gross per 24 hour  Intake              500 ml  Output              200 ml  Net              300 ml    Dialysis:  [facility] TTS 4h 67mn   F200   114kg   2/2 bath  LUA AVF Iron Sucrose (Venofer) 50 mg IVP During Dialysis 1X Week  Mircera 75 mcg IV q  2 weeks ( last on 02/05/17) Vitamin D (Calcitriol) Oral 0.75 mcg  Po q hd    Summary: Pt is a 36y.o. 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 Anoxic brain injury-  Communicating w/ gestures.  Passed FEES test and now has cuffless trach w/ lower size 2. ESRD - on TTS HD, started HD June '17. Plan MWSat HD this week. Has very low fluid intakes, plan no UF with HD. HD upstairs today 3. Anemia- hgb 9.5 which is up, on max aranesp 4. MBD of CKD - Ca and Phos in range, will dc CaCO3.  No binders. PTH is 88 here.   5. HTN - soft BP's and tachy, no BP meds needed 6. PVD- s/p L AKA- wound vac 7. Volume - looks dry on exam  RKelly SplinterMD CThe Orthopedic Specialty Hospitalpgr ([phone]  03/25/2017, 11:05 AM       Labs: Basic Metabolic Panel:  Recent Labs Lab 03/21/17 1430 03/22/17 0501 03/23/17 0303 03/24/17 0800  NA 143 144 144 140  K 4.2 4.5 4.3 4.1  CL 99* 101 100* 94*  CO2 26 27 26 27   GLUCOSE 206* 240* 196* 157*  BUN 73* 89* 137* 71*  CREATININE 4.99* 5.81* 7.30* 4.26*   CALCIUM 9.4 9.4 9.4 9.9  PHOS 5.6*  --   --   --    Liver Function Tests:  Recent Labs Lab 03/22/17 0501 03/23/17 0303 03/24/17 0800  AST 26 27 34  ALT 13* 13* 17  ALKPHOS 326* 298* 350*  BILITOT 1.5* 1.4* 1.5*  PROT 7.4 7.2 8.5*  ALBUMIN 2.8* 2.7* 3.9   No results for input(s): LIPASE, AMYLASE in the last 168 hours. No results for input(s): AMMONIA in the last 168 hours. CBC:  Recent Labs Lab 03/19/17 0504 03/21/17 1430 03/22/17 0501 03/23/17 0303 03/24/17 0800  WBC 18.6* 19.4* 23.8* 20.5* 18.8*  HGB 8.2* 8.8* 8.7* 8.3* 9.5*  HCT 28.5* 30.7* 30.8* 28.8* 33.4*  MCV 99.0 99.0 100.0 99.7 99.7  PLT 438* 555* 483* 486* 441*  Cardiac Enzymes:  Recent Labs Lab 03/21/17 1855  TROPONINI 0.04*   CBG:  Recent Labs Lab 03/24/17 1600 03/24/17 2018 03/25/17 0002 03/25/17 0437 03/25/17 0948  GLUCAP 128* 191* 191* 184* 240*    Iron Studies: No results for input(s): IRON, TIBC, TRANSFERRIN, FERRITIN in the last 72 hours. Studies/Results: No results found. Medications: Infusions: . sodium chloride    . sodium chloride      Scheduled Medications: . calcium carbonate (dosed in mg elemental calcium)  250 mg of elemental calcium Oral TID  . carvedilol  12.5 mg Oral BID WC  . chlorhexidine gluconate (MEDLINE KIT)  15 mL Mouth Rinse BID  . darbepoetin (ARANESP) injection - DIALYSIS  200 mcg Intravenous Q Wed-HD  . [START ON 04/02/2017] darbepoetin (ARANESP) injection - DIALYSIS  200 mcg Intravenous Q Thu-HD  . feeding supplement (NEPRO CARB STEADY)  1,000 mL Per Tube Q24H  . feeding supplement (PRO-STAT SUGAR FREE 64)  60 mL Per Tube QID  . [name]'s butt cream   Topical QID  . heparin  5,000 Units Subcutaneous Q8H  . insulin aspart  0-15 Units Subcutaneous Q4H  . mouth rinse  15 mL Mouth Rinse QID  . multivitamin  1 tablet Oral QHS  . pantoprazole sodium  40 mg Per Tube Q24H  . saccharomyces boulardii  250 mg Oral BID  . sodium chloride flush  10-40 mL  Intracatheter Q12H    have reviewed scheduled and prn medications.  Physical Exam: General: tracking some- following some commands- on just O2 via trach Heart: tachy Lungs: mostly clear- louder sounds Abdomen: obese, soft Extremities: foot in boot- L AKA with wound vac Dialysis Access: avf on left - good thrill and bruit   03/25/2017,11:05 AM  LOS: 39 days

## 2017-03-25 NOTE — Progress Notes (Signed)
PROGRESS NOTE        PATIENT DETAILS Name: [name] Age: 36 y.o. Sex: female Date of Birth: 23-Apr-1981 Admit Date: 02/14/2017 Admitting Physician Lady Deutscher, MD XTA:[id number], Helene Kelp, FNP  Note-care assumed by me on 6/18  Brief Narrative: Patient is a 36 y.o. female with history of ESRD on HD, poorly controlled diabetes, history of bilateral transmetatarsal foot amputation with subsequent left foot wound dehiscence (refused BKA as outpatient), chronic systolic heart failure felt to be secondary to nonischemic cardiomyopathy admitted on 02/14/17 with wound dehiscence and persistent drainage from the left trans-metatarsal amputation site, she was admitted to the [facility]. She was subsequently found to be unresponsive in her room on 5/19 after getting IV Dilaudid-she was  in asystole, CPR was started, with ROSC in around 9 minutes.She was then transferred to the intensive care unit, and subsequently underwent cooling and then rewarming. She was unable to be liberated off the ventilator, and as a result underwent tracheostomy on 5/31. Patient has been evaluated by neurology during this hospital stay, per neurology chances of meaningful neurological recovery is dismal. Hospital course has now been complicated by persistent leukocytosis, respiratory failure, and wound dehiscence with gangrene causing persistent SIRS pathophysiology. 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 Since then the patient has been weaned off the vent.  Subjective: Patient seen during dialysis, hypotensive with systolic blood pressure in the 80s   Assessment/Plan: Acute /chronic hypoxemic respiratory failure in a setting of cardiac arrest:  Last ventilator use was on night of 6/19-6/20. On ATC for several days at 28%FIo2 .  PCCM following for resp issues -tracheostomy in place. Change to #4 cuffless trach .  Marland Kitchen S/P MBS 6/23, recommendation is NPO as the patient was too  lethargic . Now status post FEES 6/27. Patient started on nectar thick liquids and dysphagia 1 diet. Holding off on PEG for now. Remove Cortrak, 6/28 with swallowing goes well today   Diarrhea, continues to have loose stools Continue probiotics,attempt to dc flexseal soon  Tachycardia-sinus tachycardia, occasional SVT, likely secondary to low-grade fever, low blood pressure, not sure if the patient will be a candidate for long-term hemodialysis. Repeat 2-D echo to rule out repeat pericardial effusion  Cardiac arrest on 5/19 with anoxic brain injury:  She seems to be slowly improving on daily basis, following all major commands. Evaluated by neurology on 5/29-felt to have poor overall prognoses for any meaningful recovery.     Systemic inflammatory response syndrome:/Persistent leukocytosis low-grade fever resolved, tachycardic-and has persistent leukocytosis white count slightly improved from 23.3-> 18.6 >20.5>18.8  . Likely secondary to left foot gangrene/infection and abscess-she is now status post AKA and still continues to have these symptoms. Blood cultures on 6/1, 6/13 continue to be negative. The repeat blood cultures 6/25 . Chest x-ray on 6/13 negative for pneumonia. Patient underwent CT chest on 6/24, which was negative for pneumonia, PE. Ruled out for C. difficile 2,   received multiple courses of antibiotics between 5/19-6/20,   She was on vancomycin and Tressie Ellis that was started on 6/14, and discontinued on 6/16 after AKA.  Dr Linus Salmons- recommended  vancomycin and Fortaz through 6/20, now completed .    Central line discontinued 6/26 due to malfunction   Left trans-metatarsal stump wound dehiscence with infection and gangrene: Per operative note-pus/abscess noted extending up to the knee when BKA was  attempted, subsequently underwent a AKA. Note, initially family was very reluctant to proceed with amputation, but given persistent leukocytosis/fever and potential risk of sepsis, family agreed  for amputation, subsequently AKA was done on 6/15.  Orthopedics removed wound VAC   ESRD: Nephrology following-HD on TTS. Hypotensive during HD  Anemia: Hemoglobin 7.5-9.0  Likely secondary to chronic disease-probably worsened by acute illness. No signs of bleeding, hemoglobin has been stable around 8.2-8.5  DM-2: CBGs stable-continue with SSI.   Chronic systolic heart failure/nonischemic cardiomyopathy (EF 30-35% by TEE on 6/4): Volume status remained stable-this is managed with dialysis-continue with Coreg.   Pericardial effusion: Seen on TEE-with no evidence of tamponade pathophysiology. Repeat echo  6/26 to recheck EF and evaluate pericardial effusion  Moderate protein calorie malnutrition: Started PO's  today  Hypokalemia-stable  Goals of care:  Prognosis is extremely guarded, need to make a decision about continuing hemodialysis   Telemetry (independently reviewed): Sinus tachycardia  Studies: Lower extremity ultrasound 5/18 >> no evidence of DVT CT head 5/19 >> normal exam Echo 5/20 >> EF 30-35%, increased LVF. Diffuse hypokinesis, akinesis of basilar mid-inferior myocardium, grade 2 diastolic dysfxn  EEG 0/31 >> finding c/w mod to severe global cerebral dysfxn. C/w anoxic injury given clinical course  LE [place] 5/21 >> negative MRI brain 5/24 >> ?thrombosed cortical vein, otherwise normal TEE 6/4 >> mild MR, normal AV, mild TR, mild PR, EF 30-35%, diffuse hypokinesis, no thrombus, no PFO, normal RV, moderate pericardial effusion with synechia suggesting some chronicity, no vegetations   Echo (reviewed):EF 30-35% on TEE done on 6/4  Morning labs/Imaging ordered: yes  DVT Prophylaxis: Prophylactic Heparin   Code Status: Full code   Family Communication: Mother at bedside, discussed on a daily basis  Disposition Plan: Remain inpatient in SDU  Antimicrobial agents: Anti-infectives    Start     Dose/Rate Route Frequency Ordered Stop   03/17/17 1800  cefTAZidime  (FORTAZ) 2 g in dextrose 5 % 50 mL IVPB  Status:  Discontinued     2 g 100 mL/hr over 30 Minutes Intravenous Every T-Th-Sa (1800) 03/15/17 1143 03/18/17 1223   03/17/17 1200  vancomycin (VANCOCIN) IVPB 1000 mg/200 mL premix  Status:  Discontinued     1,000 mg 200 mL/hr over 60 Minutes Intravenous Every T-Th-Sa (Hemodialysis) 03/15/17 1143 03/18/17 1223   03/14/17 1200  vancomycin (VANCOCIN) IVPB 1000 mg/200 mL premix  Status:  Discontinued     1,000 mg 200 mL/hr over 60 Minutes Intravenous Every T-Th-Sa (Hemodialysis) 03/12/17 0912 03/14/17 1542   03/13/17 1430  ceFAZolin (ANCEF) IVPB 2g/100 mL premix  Status:  Discontinued     2 g 200 mL/hr over 30 Minutes Intravenous To ShortStay Surgical 03/12/17 1208 03/13/17 1113   03/13/17 1115  ceFAZolin (ANCEF) IVPB 2g/100 mL premix     2 g 200 mL/hr over 30 Minutes Intravenous To Surgery 03/13/17 1108 03/13/17 1506   03/12/17 1800  cefTAZidime (FORTAZ) 2 g in dextrose 5 % 50 mL IVPB  Status:  Discontinued     2 g 100 mL/hr over 30 Minutes Intravenous Every T-Th-Sa (1800) 03/12/17 0912 03/14/17 1542   03/12/17 1200  vancomycin (VANCOCIN) 1,500 mg in sodium chloride 0.9 % 250 mL IVPB     1,500 mg 250 mL/hr over 60 Minutes Intravenous Every Thu (Hemodialysis) 03/12/17 0912 03/12/17 1356   03/02/17 1200  cefTRIAXone (ROCEPHIN) 2 g in dextrose 5 % 50 mL IVPB     2 g 100 mL/hr over 30 Minutes Intravenous Every 24  hours 03/02/17 0810 03/06/17 1358   03/02/17 1200  vancomycin (VANCOCIN) IVPB 1000 mg/200 mL premix     1,000 mg 200 mL/hr over 60 Minutes Intravenous Every M-W-F (Hemodialysis) 03/02/17 0815 03/02/17 1457   03/02/17 1200  metroNIDAZOLE (FLAGYL) IVPB 500 mg     500 mg 100 mL/hr over 60 Minutes Intravenous Every 8 hours 03/02/17 0948 03/06/17 2230   02/26/17 1200  vancomycin (VANCOCIN) IVPB 1000 mg/200 mL premix     1,000 mg 200 mL/hr over 60 Minutes Intravenous Every T-Th-Sa (Hemodialysis) 02/25/17 1151 03/05/17 1325   02/25/17 1200   vancomycin (VANCOCIN) 2,000 mg in sodium chloride 0.9 % 500 mL IVPB     2,000 mg 250 mL/hr over 120 Minutes Intravenous  Once 02/25/17 1149 02/25/17 1449   02/25/17 0900  cefTRIAXone (ROCEPHIN) 2 g in dextrose 5 % 50 mL IVPB  Status:  Discontinued     2 g 100 mL/hr over 30 Minutes Intravenous Every 24 hours 02/25/17 0814 03/02/17 0810   02/25/17 0900  metroNIDAZOLE (FLAGYL) IVPB 500 mg  Status:  Discontinued     500 mg 100 mL/hr over 60 Minutes Intravenous Every 8 hours 02/25/17 0814 03/02/17 0948   02/20/17 1200  vancomycin (VANCOCIN) IVPB 1000 mg/200 mL premix  Status:  Discontinued     1,000 mg 200 mL/hr over 60 Minutes Intravenous Every M-W-F (Hemodialysis) 02/19/17 1506 02/20/17 1403   02/17/17 1800  meropenem (MERREM) 500 mg in sodium chloride 0.9 % 50 mL IVPB  Status:  Discontinued     500 mg 100 mL/hr over 30 Minutes Intravenous Daily-1800 02/17/17 1026 02/25/17 0814   02/17/17 1200  vancomycin (VANCOCIN) IVPB 1000 mg/200 mL premix     1,000 mg 200 mL/hr over 60 Minutes Intravenous Every T-Th-Sa (Hemodialysis) 02/14/17 1758 02/19/17 1612   02/14/17 2200  clindamycin (CLEOCIN) IVPB 900 mg  Status:  Discontinued     900 mg 100 mL/hr over 30 Minutes Intravenous Every 8 hours 02/14/17 1106 02/14/17 1928   02/14/17 2000  clindamycin (CLEOCIN) IVPB 900 mg  Status:  Discontinued     900 mg 100 mL/hr over 30 Minutes Intravenous Every 8 hours 02/14/17 1928 02/16/17 1047   02/14/17 1915  piperacillin-tazobactam (ZOSYN) IVPB 3.375 g  Status:  Discontinued     3.375 g 100 mL/hr over 30 Minutes Intravenous Every 12 hours 02/14/17 1801 02/17/17 1026   02/14/17 1845  vancomycin (VANCOCIN) 2,000 mg in sodium chloride 0.9 % 500 mL IVPB     2,000 mg 250 mL/hr over 120 Minutes Intravenous  Once 02/14/17 1758 02/14/17 2107   02/14/17 1115  clindamycin (CLEOCIN) IVPB 900 mg  Status:  Discontinued     900 mg 100 mL/hr over 30 Minutes Intravenous  Once 02/14/17 1106 02/14/17 2236   02/14/17 0945   clindamycin (CLEOCIN) IVPB 600 mg     600 mg 100 mL/hr over 30 Minutes Intravenous  Once 02/14/17 0931 02/14/17 1013      Procedures: ETT 5/19 >> 5/31 Lt IJ CVL 5/19 >> out Trach 5/31 >>  TEE 6/4 >> mild MR, normal AV, mild TR, mild PR, EF 30-35%, diffuse hypokinesis, no thrombus, no PFO, normal RV, moderate pericardial effusion with synechia suggesting some chronicity, no vegetations  Rt IJ TLC 6/13>> Left AKA 6/15>>  CONSULTS:  Infectious disease:  Neurology  Gastroenterology  Wound care  PCCM  Nephrology  Orthopedics  Time spent: 30 minutes-Greater than 50% of this time was spent in counseling, explanation of diagnosis, planning of further  management, and coordination of care.  MEDICATIONS: Scheduled Meds: . calcium carbonate (dosed in mg elemental calcium)  250 mg of elemental calcium Oral TID  . carvedilol  12.5 mg Oral BID WC  . chlorhexidine gluconate (MEDLINE KIT)  15 mL Mouth Rinse BID  . darbepoetin (ARANESP) injection - DIALYSIS  200 mcg Intravenous Q Wed-HD  . [START ON 04/02/2017] darbepoetin (ARANESP) injection - DIALYSIS  200 mcg Intravenous Q Thu-HD  . feeding supplement (NEPRO CARB STEADY)  1,000 mL Per Tube Q24H  . feeding supplement (PRO-STAT SUGAR FREE 64)  60 mL Per Tube QID  . [name]'s butt cream   Topical QID  . heparin  5,000 Units Subcutaneous Q8H  . insulin aspart  0-15 Units Subcutaneous Q4H  . mouth rinse  15 mL Mouth Rinse QID  . multivitamin  1 tablet Oral QHS  . pantoprazole sodium  40 mg Per Tube Q24H  . saccharomyces boulardii  250 mg Oral BID  . sodium chloride flush  10-40 mL Intracatheter Q12H   Continuous Infusions: . sodium chloride    . sodium chloride     PRN Meds:.sodium chloride, sodium chloride, acetaminophen (TYLENOL) oral liquid 160 mg/5 mL, albuterol, alteplase, fentaNYL (SUBLIMAZE) injection, heparin, heparin, lidocaine (PF), lidocaine-prilocaine, loperamide, metoprolol tartrate, pentafluoroprop-tetrafluoroeth,  sodium chloride flush   PHYSICAL EXAM: Vital signs: Vitals:   03/25/17 1115 03/25/17 1120 03/25/17 1125 03/25/17 1130  BP: (!) 63/42 (!) 80/55 (!) 85/54 (!) 84/57  Pulse: (!) 119 (!) 120 (!) 119 (!) 115  Resp:      Temp:      TempSrc:      SpO2:      Weight:      Height:       Filed Weights   03/24/17 0352 03/25/17 0454 03/25/17 1040  Weight: 74 kg (163 lb 2.3 oz) 73 kg (160 lb 15 oz) 77 kg (169 lb 12.1 oz)   Body mass index is 25.44 kg/m.  General appearance:Sleeping when I walked in-easily arouses, squeeze my hands. Eyes: No scleral icterus HEENT: Atraumatic and Normocephalic Neck: supple, tracheostomy in place Resp:Good air entry bilaterally, some transmitted upper airway sounds CVS: S1 S2 regular, tachycardic GI: Bowel sounds present, Non tender and not distended with no gaurding, rigidity or rebound. Extremities: Left AKA stump-vac in place. Musculoskeletal:No digital cyanosis  I have personally reviewed following labs and imaging studies  LABORATORY DATA: CBC:  Recent Labs Lab 03/19/17 0504 03/21/17 1430 03/22/17 0501 03/23/17 0303 03/24/17 0800  WBC 18.6* 19.4* 23.8* 20.5* 18.8*  HGB 8.2* 8.8* 8.7* 8.3* 9.5*  HCT 28.5* 30.7* 30.8* 28.8* 33.4*  MCV 99.0 99.0 100.0 99.7 99.7  PLT 438* 555* 483* 486* 441*    Basic Metabolic Panel:  Recent Labs Lab 03/19/17 0504 03/21/17 1430 03/22/17 0501 03/23/17 0303 03/24/17 0800  NA 140 143 144 144 140  K 3.8 4.2 4.5 4.3 4.1  CL 99* 99* 101 100* 94*  CO2 24 26 27 26 27   GLUCOSE 201* 206* 240* 196* 157*  BUN 83* 73* 89* 137* 71*  CREATININE 4.71* 4.99* 5.81* 7.30* 4.26*  CALCIUM 8.9 9.4 9.4 9.4 9.9  MG 2.2  --   --   --   --   PHOS  --  5.6*  --   --   --     GFR: Estimated Creatinine Clearance: 18.6 mL/min (A) (by C-G formula based on SCr of 4.26 mg/dL (H)).  Liver Function Tests:  Recent Labs Lab 03/19/17 0504 03/21/17 1430 03/22/17 0501  03/23/17 0303 03/24/17 0800  AST 24  --  26 27 34    ALT 7*  --  13* 13* 17  ALKPHOS 288*  --  326* 298* 350*  BILITOT 1.3*  --  1.5* 1.4* 1.5*  PROT 6.9  --  7.4 7.2 8.5*  ALBUMIN 2.3* 2.8* 2.8* 2.7* 3.9   No results for input(s): LIPASE, AMYLASE in the last 168 hours. No results for input(s): AMMONIA in the last 168 hours.  Coagulation Profile: No results for input(s): INR, PROTIME in the last 168 hours.  Cardiac Enzymes:  Recent Labs Lab 03/21/17 1855  TROPONINI 0.04*    BNP (last 3 results) No results for input(s): PROBNP in the last 8760 hours.  HbA1C: No results for input(s): HGBA1C in the last 72 hours.  CBG:  Recent Labs Lab 03/24/17 1600 03/24/17 2018 03/25/17 0002 03/25/17 0437 03/25/17 0948  GLUCAP 128* 191* 191* 184* 240*    Lipid Profile: No results for input(s): CHOL, HDL, LDLCALC, TRIG, CHOLHDL, LDLDIRECT in the last 72 hours.  Thyroid Function Tests: No results for input(s): TSH, T4TOTAL, FREET4, T3FREE, THYROIDAB in the last 72 hours.  Anemia Panel: No results for input(s): VITAMINB12, FOLATE, FERRITIN, TIBC, IRON, RETICCTPCT in the last 72 hours.  Urine analysis:    Component Value Date/Time   COLORURINE AMBER (A) 02/14/2017 2111   APPEARANCEUR HAZY (A) 02/14/2017 2111   LABSPEC 1.015 02/14/2017 2111   PHURINE 5.0 02/14/2017 2111   GLUCOSEU NEGATIVE 02/14/2017 2111   HGBUR SMALL (A) 02/14/2017 2111   BILIRUBINUR NEGATIVE 02/14/2017 2111   KETONESUR NEGATIVE 02/14/2017 2111   PROTEINUR 30 (A) 02/14/2017 2111   UROBILINOGEN 0.2 09/21/2014 1908   NITRITE NEGATIVE 02/14/2017 2111   LEUKOCYTESUR TRACE (A) 02/14/2017 2111    Sepsis Labs: Lactic Acid, Venous    Component Value Date/Time   LATICACIDVEN 4.6 ([place]) 02/14/2017 1807    MICROBIOLOGY: Recent Results (from the past 240 hour(s))  C difficile quick scan w PCR reflex     Status: None   Collection Time: 03/19/17  9:00 AM  Result Value Ref Range Status   C Diff antigen NEGATIVE NEGATIVE Final   C Diff toxin NEGATIVE NEGATIVE  Final   C Diff interpretation No C. difficile detected.  Final  Culture, blood (Routine X 2) w Reflex to ID Panel     Status: None (Preliminary result)   Collection Time: 03/23/17  2:06 PM  Result Value Ref Range Status   Specimen Description BLOOD RIGHT WRIST  Final   Special Requests   Final    BOTTLES DRAWN AEROBIC AND ANAEROBIC Blood Culture adequate volume   Culture NO GROWTH 1 DAY  Final   Report Status PENDING  Incomplete  Culture, blood (Routine X 2) w Reflex to ID Panel     Status: None (Preliminary result)   Collection Time: 03/23/17  2:06 PM  Result Value Ref Range Status   Specimen Description BLOOD RIGHT ARM  Final   Special Requests   Final    BOTTLES DRAWN AEROBIC ONLY Blood Culture adequate volume   Culture NO GROWTH 1 DAY  Final   Report Status PENDING  Incomplete    RADIOLOGY STUDIES/RESULTS: Ct Abdomen Wo Contrast  Result Date: 03/02/2017 CLINICAL DATA:  Preop for gastrostomy to EXAM: CT ABDOMEN WITHOUT CONTRAST TECHNIQUE: Multidetector CT imaging of the abdomen was performed following the standard protocol without IV contrast. COMPARISON:  None. FINDINGS: Lower chest: Bibasilar dependent and medial consolidation. Large pericardial effusion. Hepatobiliary: The left  lobe of the liver is prominent and anterior to the stoma. The gallbladder is not clearly visualized. It may either be decompressed or absent due to cholecystectomy. Pancreas: Unremarkable Spleen: Unremarkable Adrenals/Urinary Tract: Kidneys and adrenal glands are within normal limits. Stomach/Bowel: The stomach is positioned deep to the liver and colon. Gastrostomy tube placement may be problematic. No evidence of small-bowel obstruction. Feeding tube tip is in the proximal duodenum. Vascular/Lymphatic: Small para-aortic lymph nodes. Atherosclerotic vascular calcifications are noted. No evidence of aortic aneurysm. Other: No free-fluid. Musculoskeletal: No vertebral compression deformity. IMPRESSION: The stomach is  positioned deep to a prominent left lobe of the liver as well as the transverse colon. Gas is distension of the stomach and barium opacification of the transverse colon will be essential during the procedure. Bibasilar pulmonary consolidation. Large pericardial effusion. Electronically Signed   By: Marybelle Killings M.D.   On: 03/02/2017 07:18   Ct Angio Chest Pe W Or Wo Contrast  Result Date: 03/22/2017 CLINICAL DATA:  Tachycardia. EXAM: CT ANGIOGRAPHY CHEST WITH CONTRAST TECHNIQUE: Multidetector CT imaging of the chest was performed using the standard protocol during bolus administration of intravenous contrast. Multiplanar CT image reconstructions and MIPs were obtained to evaluate the vascular anatomy. CONTRAST:  100 cc Isovue COMPARISON:  None. FINDINGS: Cardiovascular: No filling defects within the pulmonary arteries arteries to suggest acute pulmonary embolism. Mediastinum/Nodes: No axillary or supraclavicular adenopathy. No mediastinal adenopathy. Tracheostomy tube extends the mid trachea. Feeding tube extends the stomach esophagus. Lungs/Pleura: Mild basilar atelectasis. No pleural effusion. No infiltrate or overt pulmonary edema. Upper Abdomen: Limited view of the liver, kidneys, pancreas are unremarkable. Normal adrenal glands. Musculoskeletal: No aggressive osseous lesion. Review of the MIP images confirms the above findings. IMPRESSION: 1. No pulmonary embolism. 2. Bibasilar atelectasis. 3. Tracheostomy tube appears in good position Electronically Signed   By: Suzy Bouchard M.D.   On: 03/22/2017 13:52   Ir Fluoro Guide Cv Line Right  Result Date: 03/11/2017 INDICATION: Osteomyelitis EXAM: TUNNELED RIGHT JUGULAR PICC LINE PLACEMENT WITH ULTRASOUND AND FLUOROSCOPIC GUIDANCE MEDICATIONS: None. ANESTHESIA/SEDATION: None FLUOROSCOPY TIME:  Fluoroscopy Time:  minutes 30 seconds (1 mGy). COMPLICATIONS: None immediate. PROCEDURE: The patient was advised of the possible risks and complications and agreed  to undergo the procedure. The patient was then brought to the angiographic suite for the procedure. The right neck was prepped with chlorhexidine, draped in the usual sterile fashion using maximum barrier technique (cap and mask, sterile gown, sterile gloves, large sterile sheet, hand hygiene and cutaneous antiseptic). Local anesthesia was attained by infiltration with 1% lidocaine. Ultrasound demonstrated patency of the right jugular vein, and this was documented with an image. Under real-time ultrasound guidance, this vein was accessed with a 21 gauge micropuncture needle and image documentation was performed. A long subcutaneous tract was employed. The needle was exchanged over a guidewire for a peel-away sheath through which a 26 cm 5 [place] double lumen power injectable PICC was advanced, and positioned with its tip at the lower SVC/right atrial junction. The cuff was positioned in the subcutaneous tract. Fluoroscopy during the procedure and fluoro spot radiograph confirms appropriate catheter position. The catheter was flushed, secured to the skin with Prolene sutures, and covered with a sterile dressing. IMPRESSION: Successful placement of a tunneled right jugular PICC with sonographic and fluoroscopic guidance. The catheter is ready for use. Electronically Signed   By: Marybelle Killings M.D.   On: 03/11/2017 15:31   Ir US Guide Vasc Access Right  Result Date: 03/11/2017 INDICATION: Osteomyelitis  EXAM: TUNNELED RIGHT JUGULAR PICC LINE PLACEMENT WITH ULTRASOUND AND FLUOROSCOPIC GUIDANCE MEDICATIONS: None. ANESTHESIA/SEDATION: None FLUOROSCOPY TIME:  Fluoroscopy Time:  minutes 30 seconds (1 mGy). COMPLICATIONS: None immediate. PROCEDURE: The patient was advised of the possible risks and complications and agreed to undergo the procedure. The patient was then brought to the angiographic suite for the procedure. The right neck was prepped with chlorhexidine, draped in the usual sterile fashion using maximum barrier  technique (cap and mask, sterile gown, sterile gloves, large sterile sheet, hand hygiene and cutaneous antiseptic). Local anesthesia was attained by infiltration with 1% lidocaine. Ultrasound demonstrated patency of the right jugular vein, and this was documented with an image. Under real-time ultrasound guidance, this vein was accessed with a 21 gauge micropuncture needle and image documentation was performed. A long subcutaneous tract was employed. The needle was exchanged over a guidewire for a peel-away sheath through which a 26 cm 5 [place] double lumen power injectable PICC was advanced, and positioned with its tip at the lower SVC/right atrial junction. The cuff was positioned in the subcutaneous tract. Fluoroscopy during the procedure and fluoro spot radiograph confirms appropriate catheter position. The catheter was flushed, secured to the skin with Prolene sutures, and covered with a sterile dressing. IMPRESSION: Successful placement of a tunneled right jugular PICC with sonographic and fluoroscopic guidance. The catheter is ready for use. Electronically Signed   By: Marybelle Killings M.D.   On: 03/11/2017 15:31   Dg Chest Port 1 View  Result Date: 03/21/2017 CLINICAL DATA:  Fever. EXAM: PORTABLE CHEST 1 VIEW COMPARISON:  March 17, 2017 FINDINGS: A tracheostomy tube is stable. A feeding tube terminates below today's film. A right-sided line terminates in the right atrium, unchanged. Stable cardiomegaly. No pneumothorax. No pulmonary nodules, masses, or focal infiltrates. IMPRESSION: Stable support apparatus. Cardiomegaly without overt edema. No change. Electronically Signed   By: Dorise Bullion III M.D   On: 03/21/2017 14:56   Dg Chest Port 1 View  Result Date: 03/17/2017 CLINICAL DATA:  Shortness of Breath EXAM: PORTABLE CHEST 1 VIEW COMPARISON:  03/11/2017 FINDINGS: Cardiac shadow is enlarged but stable. Tracheostomy tube, feeding catheter and right-sided jugular central line are noted. The lungs are  poorly aerated but no focal infiltrate is seen. Mild crowding of the vascular markings is noted related to the poor inspiratory effort. No bony abnormality is seen. IMPRESSION: Stable cardiomegaly. Poor inspiratory effort although no focal infiltrate is seen. Electronically Signed   By: Inez Catalina M.D.   On: 03/17/2017 13:48   Dg Chest Port 1 View  Result Date: 03/11/2017 CLINICAL DATA:  Leukocytosis. EXAM: PORTABLE CHEST 1 VIEW COMPARISON:  03/03/2017 FINDINGS: Patient is rotated to the left. The cardio pericardial silhouette is enlarged. Slight improvement in left base aeration. Tracheostomy tube remains in place. A feeding tube passes into the stomach although the distal tip position is not included on the film. The visualized bony structures of the thorax are intact. Telemetry leads overlie the chest. IMPRESSION: Persistent enlargement of the cardiopericardial silhouette without evidence for overt pulmonary edema or substantial pleural effusion. Electronically Signed   By: Misty Stanley M.D.   On: 03/11/2017 08:37   Dg Chest Port 1 View  Result Date: 03/03/2017 CLINICAL DATA:  Respiratory failure. EXAM: PORTABLE CHEST 1 VIEW COMPARISON:  03/01/2017 . FINDINGS: Tracheostomy tube and feeding tube in stable position. Cardiomegaly with mild diffuse interstitial prominence suggesting mild CHF. Persistent atelectasis and consolidation left lower lobe. Small left pleural effusion cannot be excluded. No pneumothorax.  IMPRESSION: 1. Tracheostomy tube and feeding tube in stable position. 2. Cardiomegaly with diffuse mild interstitial prominence suggesting mild CHF. 3. Persistent left lower lobe atelectasis and consolidation. Small left pleural effusion cannot be excluded . Electronically Signed   By: Marcello Moores  Register   On: 03/03/2017 07:09   Dg Chest Port 1 View  Result Date: 03/01/2017 CLINICAL DATA:  Respiratory failure EXAM: PORTABLE CHEST 1 VIEW COMPARISON:  02/27/2017 FINDINGS: 0546 hours. Patient rotated  to the left. Tracheostomy tube remains in place. A feeding tube passes into the stomach although the distal tip position is not included on the film. The cardio pericardial silhouette is enlarged. Left base collapse/consolidation again noted. There is pulmonary vascular congestion without overt pulmonary edema. IMPRESSION: Rotated film with cardiomegaly and vascular congestion. Persistent left base collapse/ consolidation. Electronically Signed   By: Misty Stanley M.D.   On: 03/01/2017 07:43   Dg Chest Port 1 View  Result Date: 02/27/2017 CLINICAL DATA:  Hypoxia. EXAM: PORTABLE CHEST 1 VIEW COMPARISON:  02/26/2017. FINDINGS: Tracheostomy to in stable position. Stable cardiomegaly. Low lung volumes. Progressive left lower lobe atelectasis and infiltrate. Small left pleural effusion cannot be excluded . IMPRESSION: 1. Tracheostomy tube in stable position. 2. Progressive left lower lobe atelectasis and infiltrate. Small left pleural effusion cannot be excluded . Electronically Signed   By: Marcello Moores  Register   On: 02/27/2017 07:01   Dg Chest Port 1 View  Result Date: 02/26/2017 CLINICAL DATA:  Status post tracheostomy placement. EXAM: PORTABLE CHEST 1 VIEW COMPARISON:  Earlier today. FINDINGS: The endotracheal tube has been removed and replaced with a tracheostomy tube in satisfactory position. The nasogastric tube and esophageal probe have been removed. The left jugular catheter has been removed. No pneumothorax. Grossly stable enlarged cardiac silhouette. Left lower lobe opacity with improvement laterally since 02/25/2017. Clear right lung. Unremarkable bones. IMPRESSION: 1. Tracheostomy tube in satisfactory position. 2. Left lower lobe atelectasis or pneumonia with mild improvement since 2 days ago. 3. Stable cardiomegaly. Electronically Signed   By: Claudie Revering M.D.   On: 02/26/2017 16:02   Dg Chest Port 1 View  Result Date: 02/26/2017 CLINICAL DATA:  Hypoxia.  Shortness of breath. EXAM: PORTABLE CHEST 1  VIEW COMPARISON:  02/25/2017. FINDINGS: Endotracheal tube, NG tube, left IJ line esophageal probe in stable position. Cardiomegaly with bilateral pulmonary interstitial infiltrates consistent with CHF. Left base atelectasis . Small left pleural effusion. Similar findings noted on prior exam. No pneumothorax. IMPRESSION: 1. Lines and tubes in stable position. 2. Cardiomegaly with bilateral pulmonary interstitial prominence and small left pleural effusion noted consistent CHF. Left base atelectasis. Similar findings noted on prior exam. Electronically Signed   By: Cross [place]   On: 02/26/2017 07:18   Dg Chest Port 1 View  Result Date: 02/25/2017 CLINICAL DATA:  Respiratory failure, intubated patient. Diabetes, cardiomyopathy, morbid obesity, end-stage renal disease. EXAM: PORTABLE CHEST 1 VIEW COMPARISON:  Portable chest x-ray of Feb 24, 2017 FINDINGS: The lungs are adequately inflated. The pulmonary interstitial markings are slightly less prominent today. The pulmonary vascularity remains engorged. The cardiac silhouette remains enlarged. Retrocardiac region on the left is slightly less dense. The endotracheal tube tip lies 5.7 cm above the carina. The esophagogastric tube tip projects below the inferior margin of the image. The left internal jugular venous catheter tip projects over the proximal SVC. IMPRESSION: Slight interval improvement in pulmonary interstitial edema. Stable cardiomegaly. Persistent left lower lobe atelectasis or pneumonia. The support tubes are in reasonable position. Electronically Signed  By: David  Martinique M.D.   On: 02/25/2017 07:57   Dg Chest Port 1 View  Result Date: 02/24/2017 CLINICAL DATA:  Respiratory failure EXAM: PORTABLE CHEST 1 VIEW COMPARISON:  Two days ago FINDINGS: Endotracheal tube tip at the clavicular heads. An orogastric tube reaches the stomach. Esophageal thermistor. Left IJ central line with tip at the SVC level. Cardiomegaly and diffuse hazy opacity with  cephalized blood flow. No definitive effusion. No pneumothorax. IMPRESSION: 1. Stable positioning of tubes and central line. 2. CHF pattern. Electronically Signed   By: Monte Fantasia M.D.   On: 02/24/2017 07:31   Dg Abd Portable 1v  Result Date: 03/02/2017 CLINICAL DATA:  Encounter for feeding tube placement EXAM: PORTABLE ABDOMEN - 1 VIEW COMPARISON:  Portable exam 1712 hours compared to CT abdomen and pelvis of 03/01/2017 FINDINGS: Feeding tube traverses abdomen with tip projecting over distal antrum near pylorus. Cardiac silhouette appears enlarged. Visualized bowel gas pattern normal. Osseous structures unremarkable. IMPRESSION: Tip of feeding tube projects over distal gastric antrum near pylorus. Electronically Signed   By: Lavonia Dana M.D.   On: 03/02/2017 17:21   Dg Abd Portable 1v  Result Date: 02/27/2017 CLINICAL DATA:  Feeding tube placement EXAM: PORTABLE ABDOMEN - 1 VIEW COMPARISON:  None. FINDINGS: Feeding tube with the tip projecting over the antrum of the stomach. There is no bowel dilatation to suggest obstruction. There is no evidence of pneumoperitoneum, portal venous gas or pneumatosis. There are no pathologic calcifications along the expected course of the ureters. The osseous structures are unremarkable. IMPRESSION: Feeding tube with the tip projecting over the antrum of the stomach. Electronically Signed   By: Kathreen Devoid   On: 02/27/2017 11:05   Dg Swallowing Func-speech Pathology  Result Date: 03/21/2017 Objective Swallowing Evaluation: Type of Study: MBS-Modified Barium Swallow Study Patient Details Name: Mehek Grega MRN: [id number] Date of Birth: Mar 22, 1981 Today's Date: 03/21/2017 Time: SLP Start Time (ACUTE ONLY): 0905-SLP Stop Time (ACUTE ONLY): 0915 SLP Time Calculation (min) (ACUTE ONLY): 10 min Past Medical History: Past Medical History: Diagnosis Date . Anemia  . Arthritis   "left hand" (09/15/2013) . Asthma  . CHF (congestive heart failure) ([place])  . Chronic bronchitis ([place])    "just about q yr" (09/15/2013) . Chronic kidney disease   "low kidney function" (09/15/2013) ,  T/Th/Sa . COPD (chronic obstructive pulmonary disease) ([place])  . Coronary artery disease  . Hyperlipidemia  . Hypertension  . Migraine   "get them alot" (09/15/2013) . Myocardial infarction [facility]) 04/2015  NSTEMI . Normal coronary arteries   by cardiac catheterization 09/20/13 . Peripheral vascular disease ([place])  . Pneumonia   "couple times; have it now" (09/15/2013) . PONV (postoperative nausea and vomiting)  . Restless legs  . Shortness of breath   "just recently; related to the pneumonia" (09/15/2013) . Type 1 diabetes mellitus ([place])   type 2 Past Surgical History: Past Surgical History: Procedure Laterality Date . AMPUTATION Right 06/25/2016  Procedure: Amputation Right Great Toe at the Metatarsophalangeal Joint;  Surgeon: Newt Minion, MD;  Location: [place];  Service: Orthopedics;  Laterality: Right; . AMPUTATION Bilateral 10/08/2016  Procedure: Bilateral Transmetatarsal Amputation;  Surgeon: Newt Minion, MD;  Location: [place];  Service: Orthopedics;  Laterality: Bilateral; . AMPUTATION Left 03/13/2017  Procedure: AMPUTATION ABOVE KNEE;  Surgeon: Newt Minion, MD;  Location: [place];  Service: Orthopedics;  Laterality: Left; . AV FISTULA PLACEMENT Left 03/27/2015  Procedure: CREATION RADIAL CEPHALIC ARTERIOVENOUS FISTULA;  Surgeon: Angelia Mould,  MD;  Location: [place];  Service: Vascular;  Laterality: Left; . AV FISTULA PLACEMENT Left 11/23/2015  Procedure:  LEFT ARM BASILIC VEIN TRANSPOSITION  ;  Surgeon: Angelia Mould, MD;  Location: [place];  Service: Vascular;  Laterality: Left; . CESAREAN SECTION  1999; 2006 . CORONARY ANGIOGRAM  09/20/2013  Procedure: CORONARY ANGIOGRAM;  Surgeon: Lorretta Harp, MD;  Location: [facility] CATH LAB;  Service: Cardiovascular;; . FINGER SURGERY Left 1985  3rd and 4th digits reconstructed after cut off" (09/15/2013) . IR FLUORO GUIDE CV LINE RIGHT  03/11/2017 . IR US GUIDE VASC  ACCESS RIGHT  03/11/2017 . PERIPHERAL VASCULAR CATHETERIZATION N/A 09/05/2016  Procedure: Abdominal Aortogram;  Surgeon: Elam Dutch, MD;  Location: [place] CV LAB;  Service: Cardiovascular;  Laterality: N/A; . PERIPHERAL VASCULAR CATHETERIZATION Bilateral 09/05/2016  Procedure: Lower Extremity Angiography;  Surgeon: Elam Dutch, MD;  Location: [place] CV LAB;  Service: Cardiovascular;  Laterality: Bilateral; . PERIPHERAL VASCULAR CATHETERIZATION Right 09/05/2016  Procedure: Peripheral Vascular Balloon Angioplasty;  Surgeon: Elam Dutch, MD;  Location: [place] CV LAB;  Service: Cardiovascular;  Laterality: Right;  peroneal and AT . SHOULDER ARTHROSCOPY WITH BICEPSTENOTOMY Right 05/10/2015  Procedure: RIGHT SHOULDER ARTHROSCOPY WITH BICEPS TENOTOMY, DEBRIDEMENT LABRAL TEAR;  Surgeon: Tania Ade, MD;  Location: [place];  Service: Orthopedics;  Laterality: Right;  Right shoulder arthroscopy biceps tenotomy, debridement labral tear . STUMP REVISION Left 01/21/2017  Procedure: . Revision Left Transmetatarsal Amputation;  Surgeon: Newt Minion, MD;  Location: [place];  Service: Orthopedics;  Laterality: Left; . TONSILLECTOMY  1997 . 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 ETT 5/19 and trach'd 5/31. Hospital course included cardiac arrest, transfer to ICU No Data Recorded Assessment / Plan / Recommendation CHL IP CLINICAL IMPRESSIONS 03/21/2017 Clinical Impression Unfortunately pts arousal was a major barrier to success on this exam. Pt fully alert during bedside eval the preceeding day, and awake but drowsy on arrival to radiology. When she was transferred to the chair and positioned, pt  could not appropriately sustain arousal without max cues. SLP was able to provide stimuli and tactile cueing for one bite of puree. Pt orally transited this eventually and triggered a weak, ineffective swallow leaving the majority of the bolus in pharynx. With max cues and painful stimuli SLP was able to cue pt to trigger two more swallows to clear bolus from pharynx. At that time the study was discontinued due to risk to pt and inability to progress further. This is likely not indicative of pts function and there is potential for ability to consume PO, but pt will need to be fully alert and participatory. Will follow for further attempts.  SLP Visit Diagnosis Dysphagia, unspecified (R13.10) Attention and concentration deficit following -- Frontal lobe and executive function deficit following -- Impact on safety and function Severe aspiration risk   CHL IP TREATMENT RECOMMENDATION 03/21/2017 Treatment Recommendations Therapy as outlined in treatment plan below   No flowsheet data found. CHL IP DIET RECOMMENDATION 03/21/2017 SLP Diet Recommendations NPO;Alternative means - temporary Liquid Administration via -- Medication Administration -- Compensations -- Postural Changes --   No flowsheet data found.  CHL IP FOLLOW  UP RECOMMENDATIONS 03/21/2017 Follow up Recommendations Skilled Nursing facility   [facility] IP FREQUENCY AND DURATION 03/21/2017 Speech Therapy Frequency (ACUTE ONLY) min 2x/week Treatment Duration 2 weeks      CHL IP ORAL PHASE 03/21/2017 Oral Phase Impaired Oral - Pudding Teaspoon -- Oral - Pudding Cup -- Oral - Honey Teaspoon -- Oral - Honey Cup -- Oral - Nectar Teaspoon -- Oral - Nectar Cup -- Oral - Nectar Straw -- Oral - Thin Teaspoon -- Oral - Thin Cup -- Oral - Thin Straw -- Oral - Puree Delayed oral transit;Reduced posterior propulsion Oral - Mech Soft -- Oral - Regular -- Oral - Multi-Consistency -- Oral - Pill -- Oral Phase - Comment --  CHL IP PHARYNGEAL PHASE 03/21/2017 Pharyngeal Phase Impaired  Pharyngeal- Pudding Teaspoon -- Pharyngeal -- Pharyngeal- Pudding Cup -- Pharyngeal -- Pharyngeal- Honey Teaspoon -- Pharyngeal -- Pharyngeal- Honey Cup -- Pharyngeal -- Pharyngeal- Nectar Teaspoon -- Pharyngeal -- Pharyngeal- Nectar Cup -- Pharyngeal -- Pharyngeal- Nectar Straw -- Pharyngeal -- Pharyngeal- Thin Teaspoon -- Pharyngeal -- Pharyngeal- Thin Cup -- Pharyngeal -- Pharyngeal- Thin Straw -- Pharyngeal -- Pharyngeal- Puree Reduced laryngeal elevation;Reduced tongue base retraction;Reduced pharyngeal peristalsis;Reduced epiglottic inversion;Delayed swallow initiation-vallecula;Pharyngeal residue - valleculae;Pharyngeal residue - pyriform Pharyngeal -- Pharyngeal- Mechanical Soft -- Pharyngeal -- Pharyngeal- Regular -- Pharyngeal -- Pharyngeal- Multi-consistency -- Pharyngeal -- Pharyngeal- Pill -- Pharyngeal -- Pharyngeal Comment --  No flowsheet data found. No flowsheet data found. DeBlois, Katherene Ponto 03/21/2017, 9:55 AM                LOS: 39 days   Reyne Dumas, MD  [facility] Pager: [phone]  If 7PM-7AM, please contact night-coverage [email or web address] Password [facility] 03/25/2017, 11:44 AM

## 2017-03-25 NOTE — Progress Notes (Signed)
Nutrition Follow-up  DOCUMENTATION CODES:   Obesity unspecified  INTERVENTION:    Continue Nepro to goal rate of 15 ml/h with 60 ml Prostat QID   TF regimen providing 1464 kcals, 128 gm protein, 349 ml free water daily  NUTRITION DIAGNOSIS:   Inadequate oral intake related to inability to eat as evidenced by NPO status, ongoing  GOAL:   Patient will meet greater than or equal to 90% of their needs, met  MONITOR:   Vent status, TF tolerance, Labs, Weight trends, Skin, I & O's  ASSESSMENT:   36 yo Female w/ ESRD d/t poorly controlled DM, bilateral transmetatarsal foot amputations, obesity, HTN, medical non-compliance as well as NICM. Admitted with complaints of left leg pain, found to have radiological evidence of osteomyelitis with wound nonhealing, dehiscence, purulent drainage from the wound. Admitted for IV antibiotics, hemodialysis and possible surgery. Pt with nonhealing L foot wound, amputation recommended.   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 Nepro formula infusing at goal rate of 15 ml/hr via Cortrak small bore feeding tube.  Prostat 60 ml QID. S/p FEES today per Speech Path. Advanced to Dys 1-nectar thick liquids.  Medications reviewed. Florastor ordered 6/21. Labs reviewed. CBG's [id number]. Palliative Care Team following.  Diet Order:  DIET - DYS 1 Room service appropriate? Yes; Fluid consistency: Nectar Thick   Skin:  Left AKA stump - wound VAC MASD groin and sacrum Stage II anus  Last BM:  6/24 flexiseal  Height:   Ht Readings from Last 1 Encounters:  02/14/17 5' 8.5" (1.74 m)   Weight: >>> fluctuating but stable  Wt Readings from Last 1 Encounters:  03/25/17 169 lb 12.1 oz (77 kg)   Ideal Body Weight:  63.6 kg  BMI:  30.5 kg/m2 >> adjusted for AKA  Estimated Nutritional Needs:   Kcal:  1100-1400  Protein:  >/= 127  grams  Fluid:  per MD  EDUCATION NEEDS:   No education needs identified at this time  Arthur Holms, RD, LDN Pager #: [phone] After-Hours Pager #: [phone]

## 2017-03-25 NOTE — Progress Notes (Signed)
[facility] in [place] unable to accept patient- they state that [place] Medicaid no longer approves bedside dialysis so they would be unable to accept patient with only Medicaid as a payor source  CSW will continue to follow  Burna Sis, LCSW Clinical Social Worker [phone]

## 2017-03-25 NOTE — Progress Notes (Signed)
Meds delayed this AM due to swallow study. HD contacted for report so that patient may have treatment today.

## 2017-03-26 ENCOUNTER — Inpatient Hospital Stay (HOSPITAL_COMMUNITY): Payer: Medicaid Other

## 2017-03-26 LAB — ECHOCARDIOGRAM LIMITED
Height: 68.5 in
Weight: 2751.34 oz

## 2017-03-26 LAB — GLUCOSE, CAPILLARY
GLUCOSE-CAPILLARY: 229 mg/dL — AB (ref 65–99)
GLUCOSE-CAPILLARY: 177 mg/dL — AB (ref 65–99)
GLUCOSE-CAPILLARY: 217 mg/dL — AB (ref 65–99)
GLUCOSE-CAPILLARY: 223 mg/dL — AB (ref 65–99)
GLUCOSE-CAPILLARY: 205 mg/dL — AB (ref 65–99)
Glucose-Capillary: 253 mg/dL — ABNORMAL HIGH (ref 65–99)

## 2017-03-26 LAB — COMPREHENSIVE METABOLIC PANEL
ALK PHOS: 355 U/L — AB (ref 38–126)
ALT: 20 U/L (ref 14–54)
AST: 33 U/L (ref 15–41)
Albumin: 3.4 g/dL — ABNORMAL LOW (ref 3.5–5.0)
Anion gap: 16 — ABNORMAL HIGH (ref 5–15)
BUN: 46 mg/dL — AB (ref 6–20)
CALCIUM: 9.6 mg/dL (ref 8.9–10.3)
CO2: 27 mmol/L (ref 22–32)
CREATININE: 3.28 mg/dL — AB (ref 0.44–1.00)
Chloride: 95 mmol/L — ABNORMAL LOW (ref 101–111)
GFR, EST AFRICAN AMERICAN: 20 mL/min — AB (ref >60)
GFR, EST NON AFRICAN AMERICAN: 17 mL/min — AB (ref >60)
Glucose, Bld: 182 mg/dL — ABNORMAL HIGH (ref 65–99)
Potassium: 4.3 mmol/L (ref 3.5–5.1)
Sodium: 138 mmol/L (ref 135–145)
Total Bilirubin: 1.5 mg/dL — ABNORMAL HIGH (ref 0.3–1.2)
Total Protein: 8.3 g/dL — ABNORMAL HIGH (ref 6.5–8.1)

## 2017-03-26 LAB — CBC
HEMATOCRIT: 33 % — AB (ref 36.0–46.0)
HEMOGLOBIN: 9.4 g/dL — AB (ref 12.0–15.0)
MCH: 28.7 pg (ref 26.0–34.0)
MCHC: 28.5 g/dL — ABNORMAL LOW (ref 30.0–36.0)
MCV: 100.6 fL — AB (ref 78.0–100.0)
Platelets: 453 10*3/uL — ABNORMAL HIGH (ref 150–400)
RBC: 3.28 MIL/uL — AB (ref 3.87–5.11)
RDW: 17.5 % — ABNORMAL HIGH (ref 11.5–15.5)
WBC: 23.7 10*3/uL — AB (ref 4.0–10.5)

## 2017-03-26 MED ORDER — DEXTROSE 50 % IV SOLN: 1.0000 | Freq: Once | INTRAVENOUS | Status: DC

## 2017-03-26 MED ORDER — ACETAMINOPHEN 160 MG/5ML PO SOLN: 650.0000 mg | Freq: Four times a day (QID) | ORAL | Status: DC | PRN

## 2017-03-26 MED ORDER — FENTANYL CITRATE (PF) 100 MCG/2ML IJ SOLN: 25.0000 ug | INTRAMUSCULAR | Status: DC | PRN

## 2017-03-26 MED ORDER — SODIUM CHLORIDE 0.9 % IV BOLUS (SEPSIS)
250.0000 mL | Freq: Once | INTRAVENOUS | Status: AC
  Administered 2017-03-26: 250 mL via INTRAVENOUS

## 2017-03-26 MED ORDER — METOPROLOL TARTRATE 12.5 MG HALF TABLET
12.5000 mg | ORAL_TABLET | Freq: Four times a day (QID) | ORAL | Status: DC | PRN
  Administered 2017-03-27 – 2017-04-05 (×4): 12.5 mg via ORAL
  Filled 2017-03-26 (×3): qty 1

## 2017-03-26 NOTE — Progress Notes (Signed)
Cortrak removed. 

## 2017-03-26 NOTE — Progress Notes (Signed)
Patient arousable but sleepy this morning, not safe to attempt to eat breakfast at this time. Will continue to monitor closely and encourage PO intake.

## 2017-03-26 NOTE — Progress Notes (Signed)
PROGRESS NOTE        PATIENT DETAILS Name: [name] Age: 36 y.o. Sex: female Date of Birth: [birth year] Admit Date: 02/14/2017 Admitting Physician Lady Deutscher, MD YIR:[id number], Helene Kelp, FNP  Note-care assumed by me on 6/18  Brief Narrative: Patient is a 36 y.o. female with history of ESRD on HD, poorly controlled diabetes, history of bilateral transmetatarsal foot amputation with subsequent left foot wound dehiscence (refused BKA as outpatient), chronic systolic heart failure felt to be secondary to nonischemic cardiomyopathy admitted on 02/14/17 with wound dehiscence and persistent drainage from the left trans-metatarsal amputation site, she was admitted to the [facility]. She was subsequently found to be unresponsive in her room on 5/19 after getting IV Dilaudid-she was  in asystole, CPR was started, with ROSC in around 9 minutes.She was then transferred to the intensive care unit, and subsequently underwent cooling and then rewarming. She was unable to be liberated off the ventilator, and as a result underwent tracheostomy on 5/31. Patient has been evaluated by neurology during this hospital stay, per neurology chances of meaningful neurological recovery is dismal. Hospital course has now been complicated by persistent leukocytosis, respiratory failure, and wound dehiscence with gangrene causing persistent SIRS pathophysiology. 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 Since then the patient has been weaned off the vent.   Subjective: Fever of 102, tachypneic    Assessment/Plan: Acute /chronic hypoxemic respiratory failure in a setting of cardiac arrest:  Last ventilator use was on night of 6/19-6/20. On ATC for several days at 28%FIo2 .  PCCM following for resp issues -tracheostomy in place. Change to #4 cuffless trach .  Marland Kitchen S/P MBS 6/23,  Now status post FEES 6/27. Patient started on nectar thick liquids and dysphagia 1 diet 6/27. Holding off  on PEG for now.   Cortrak removed , 6/28 . Unfortunately patient seems to have aspirated -is febrile and tachypneic , will repeat CXR in am to confirm  Tachycardia-sinus tachycardia, occasional SVT, likely secondary to low-grade fever, low blood pressure, not sure if the patient will be a candidate for long-term hemodialysis. Repeat 2-D echo shows EF 25-30%, small pericardial effusion,previously EF was 30-35%  Cardiac arrest on 5/19 with anoxic brain injury:  She seems to be  Cognitively  improving on daily basis, following all major commands. Evaluated by neurology on 5/29-felt to have poor overall prognoses for any meaningful recovery.     Systemic inflammatory response syndrome:/Persistent leukocytosis   tachycardic-and has persistent leukocytosis white count fluctuating  >20.5>18.8 >23.7  .Initially thought to be  secondary to left foot gangrene/infection and abscess-she is now status post AKA and still continues to have these symptoms. Blood cultures on 6/1, 6/13 continue to be negative.   repeat blood cultures 6/25 ngsf,  Chest x-ray on 6/13 negative for pneumonia. Patient underwent CT chest on 6/24, which was negative for pneumonia, PE.  Ruled out for C. difficile 2,   received multiple courses of antibiotics between 5/19-6/20, She was on vancomycin and Tressie Ellis that was started on 6/14, and discontinued on 6/16 after AKA.  Dr Linus Salmons- recommended  vancomycin and Fortaz through 6/20, now completed .    Central line discontinued 6/26 due to malfunction Febrile again 6/28, concern for overt aspiration but CXR negative,will repeat again 6/29  Diarrhea, continues to have loose stools Continue probiotics,attempt to dc flexseal soon  Left  trans-metatarsal stump wound dehiscence with infection and gangrene: Per operative note-pus/abscess noted extending up to the knee when BKA was attempted, subsequently underwent a AKA. Note, initially family was very reluctant to proceed with amputation, but given  persistent leukocytosis/fever and potential risk of sepsis, family agreed for amputation, subsequently AKA was done on 6/15.  Orthopedics removed wound VAC 6/20.Requested Dr Sharol Given to revaluate left leg stump 6/28  ESRD: Nephrology following-HD on TTS.    Anemia: Hemoglobin 7.5-9.0  Likely secondary to chronic disease-probably worsened by acute illness. No signs of bleeding, hemoglobin has been stable around 8.2-8.5  DM-2: CBGs stable-continue with SSI.   Chronic systolic heart failure/nonischemic cardiomyopathy (EF 30-35% by TEE on 6/4): Volume status remained stable-this is managed with dialysis-continue with Coreg.   Pericardial effusion: Seen on TEE-with no evidence of tamponade pathophysiology. Repeat echo  6/26 no significant pericardial effusion  Moderate protein calorie malnutrition: Started PO's   6/27   Hypokalemia-stable  Goals of care:  Prognosis is extremely guarded, need to make a decision about continuing hemodialysis, resuming abx    Telemetry (independently reviewed): Sinus tachycardia  Studies: Lower extremity ultrasound 5/18 >> no evidence of DVT CT head 5/19 >> normal exam Echo 5/20 >> EF 30-35%, increased LVF. Diffuse hypokinesis, akinesis of basilar mid-inferior myocardium, grade 2 diastolic dysfxn  EEG 8/32 >> finding c/w mod to severe global cerebral dysfxn. C/w anoxic injury given clinical course  LE [place] 5/21 >> negative MRI brain 5/24 >> ?thrombosed cortical vein, otherwise normal TEE 6/4 >> mild MR, normal AV, mild TR, mild PR, EF 30-35%, diffuse hypokinesis, no thrombus, no PFO, normal RV, moderate pericardial effusion with synechia suggesting some chronicity, no vegetations   Echo (reviewed):EF 30-35% on TEE done on 6/4  Morning labs/Imaging ordered: yes  DVT Prophylaxis: Prophylactic Heparin   Code Status: Full code   Family Communication: Mother at bedside, discussed on a daily basis  Disposition Plan: Remain inpatient in SDU  Antimicrobial  agents: Anti-infectives    Start     Dose/Rate Route Frequency Ordered Stop   03/17/17 1800  cefTAZidime (FORTAZ) 2 g in dextrose 5 % 50 mL IVPB  Status:  Discontinued     2 g 100 mL/hr over 30 Minutes Intravenous Every T-Th-Sa (1800) 03/15/17 1143 03/18/17 1223   03/17/17 1200  vancomycin (VANCOCIN) IVPB 1000 mg/200 mL premix  Status:  Discontinued     1,000 mg 200 mL/hr over 60 Minutes Intravenous Every T-Th-Sa (Hemodialysis) 03/15/17 1143 03/18/17 1223   03/14/17 1200  vancomycin (VANCOCIN) IVPB 1000 mg/200 mL premix  Status:  Discontinued     1,000 mg 200 mL/hr over 60 Minutes Intravenous Every T-Th-Sa (Hemodialysis) 03/12/17 0912 03/14/17 1542   03/13/17 1430  ceFAZolin (ANCEF) IVPB 2g/100 mL premix  Status:  Discontinued     2 g 200 mL/hr over 30 Minutes Intravenous To ShortStay Surgical 03/12/17 1208 03/13/17 1113   03/13/17 1115  ceFAZolin (ANCEF) IVPB 2g/100 mL premix     2 g 200 mL/hr over 30 Minutes Intravenous To Surgery 03/13/17 1108 03/13/17 1506   03/12/17 1800  cefTAZidime (FORTAZ) 2 g in dextrose 5 % 50 mL IVPB  Status:  Discontinued     2 g 100 mL/hr over 30 Minutes Intravenous Every T-Th-Sa (1800) 03/12/17 0912 03/14/17 1542   03/12/17 1200  vancomycin (VANCOCIN) 1,500 mg in sodium chloride 0.9 % 250 mL IVPB     1,500 mg 250 mL/hr over 60 Minutes Intravenous Every Thu (Hemodialysis) 03/12/17 0912 03/12/17 1356  03/02/17 1200  cefTRIAXone (ROCEPHIN) 2 g in dextrose 5 % 50 mL IVPB     2 g 100 mL/hr over 30 Minutes Intravenous Every 24 hours 03/02/17 0810 03/06/17 1358   03/02/17 1200  vancomycin (VANCOCIN) IVPB 1000 mg/200 mL premix     1,000 mg 200 mL/hr over 60 Minutes Intravenous Every M-W-F (Hemodialysis) 03/02/17 0815 03/02/17 1457   03/02/17 1200  metroNIDAZOLE (FLAGYL) IVPB 500 mg     500 mg 100 mL/hr over 60 Minutes Intravenous Every 8 hours 03/02/17 0948 03/06/17 2230   02/26/17 1200  vancomycin (VANCOCIN) IVPB 1000 mg/200 mL premix     1,000 mg 200 mL/hr  over 60 Minutes Intravenous Every T-Th-Sa (Hemodialysis) 02/25/17 1151 03/05/17 1325   02/25/17 1200  vancomycin (VANCOCIN) 2,000 mg in sodium chloride 0.9 % 500 mL IVPB     2,000 mg 250 mL/hr over 120 Minutes Intravenous  Once 02/25/17 1149 02/25/17 1449   02/25/17 0900  cefTRIAXone (ROCEPHIN) 2 g in dextrose 5 % 50 mL IVPB  Status:  Discontinued     2 g 100 mL/hr over 30 Minutes Intravenous Every 24 hours 02/25/17 0814 03/02/17 0810   02/25/17 0900  metroNIDAZOLE (FLAGYL) IVPB 500 mg  Status:  Discontinued     500 mg 100 mL/hr over 60 Minutes Intravenous Every 8 hours 02/25/17 0814 03/02/17 0948   02/20/17 1200  vancomycin (VANCOCIN) IVPB 1000 mg/200 mL premix  Status:  Discontinued     1,000 mg 200 mL/hr over 60 Minutes Intravenous Every M-W-F (Hemodialysis) 02/19/17 1506 02/20/17 1403   02/17/17 1800  meropenem (MERREM) 500 mg in sodium chloride 0.9 % 50 mL IVPB  Status:  Discontinued     500 mg 100 mL/hr over 30 Minutes Intravenous Daily-1800 02/17/17 1026 02/25/17 0814   02/17/17 1200  vancomycin (VANCOCIN) IVPB 1000 mg/200 mL premix     1,000 mg 200 mL/hr over 60 Minutes Intravenous Every T-Th-Sa (Hemodialysis) 02/14/17 1758 02/19/17 1612   02/14/17 2200  clindamycin (CLEOCIN) IVPB 900 mg  Status:  Discontinued     900 mg 100 mL/hr over 30 Minutes Intravenous Every 8 hours 02/14/17 1106 02/14/17 1928   02/14/17 2000  clindamycin (CLEOCIN) IVPB 900 mg  Status:  Discontinued     900 mg 100 mL/hr over 30 Minutes Intravenous Every 8 hours 02/14/17 1928 02/16/17 1047   02/14/17 1915  piperacillin-tazobactam (ZOSYN) IVPB 3.375 g  Status:  Discontinued     3.375 g 100 mL/hr over 30 Minutes Intravenous Every 12 hours 02/14/17 1801 02/17/17 1026   02/14/17 1845  vancomycin (VANCOCIN) 2,000 mg in sodium chloride 0.9 % 500 mL IVPB     2,000 mg 250 mL/hr over 120 Minutes Intravenous  Once 02/14/17 1758 02/14/17 2107   02/14/17 1115  clindamycin (CLEOCIN) IVPB 900 mg  Status:  Discontinued      900 mg 100 mL/hr over 30 Minutes Intravenous  Once 02/14/17 1106 02/14/17 2236   02/14/17 0945  clindamycin (CLEOCIN) IVPB 600 mg     600 mg 100 mL/hr over 30 Minutes Intravenous  Once 02/14/17 0931 02/14/17 1013      Procedures: ETT 5/19 >> 5/31 Lt IJ CVL 5/19 >> out Trach 5/31 >>  TEE 6/4 >> mild MR, normal AV, mild TR, mild PR, EF 30-35%, diffuse hypokinesis, no thrombus, no PFO, normal RV, moderate pericardial effusion with synechia suggesting some chronicity, no vegetations  Rt IJ TLC 6/13>> Left AKA 6/15>>  CONSULTS:  Infectious disease:  Neurology  Gastroenterology  Wound care  PCCM  Nephrology  Orthopedics  Time spent: 30 minutes-Greater than 50% of this time was spent in counseling, explanation of diagnosis, planning of further management, and coordination of care.  MEDICATIONS: Scheduled Meds: . calcium carbonate (dosed in mg elemental calcium)  250 mg of elemental calcium Oral TID  . carvedilol  12.5 mg Oral BID WC  . chlorhexidine gluconate (MEDLINE KIT)  15 mL Mouth Rinse BID  . [START ON 04/02/2017] darbepoetin (ARANESP) injection - DIALYSIS  200 mcg Intravenous Q Thu-HD  . dextrose  1 ampule Intravenous Once  . [name]'s butt cream   Topical QID  . heparin  5,000 Units Subcutaneous Q8H  . insulin aspart  0-15 Units Subcutaneous Q4H  . mouth rinse  15 mL Mouth Rinse QID  . multivitamin  1 tablet Oral QHS  . pantoprazole sodium  40 mg Per Tube Q24H  . saccharomyces boulardii  250 mg Oral BID  . sodium chloride flush  10-40 mL Intracatheter Q12H   Continuous Infusions:  PRN Meds:.acetaminophen (TYLENOL) oral liquid 160 mg/5 mL, albuterol, fentaNYL (SUBLIMAZE) injection, loperamide, metoprolol tartrate, sodium chloride flush   PHYSICAL EXAM: Vital signs: Vitals:   03/26/17 0822 03/26/17 0916 03/26/17 1100 03/26/17 1206  BP: 105/73  (!) 76/42 (!) 101/58  Pulse: (!) 134 (!) 138 (!) 132   Resp: (!) 29  (!) 40 (!) 36  Temp: 98.3 F (36.8 C)    (!) 102.8 F (39.3 C)  TempSrc: Oral   Oral  SpO2: 96% 92% 95% 98%  Weight:      Height:       Filed Weights   03/25/17 1040 03/25/17 1500 03/26/17 0500  Weight: 77 kg (169 lb 12.1 oz) 77 kg (169 lb 12.1 oz) 78 kg (171 lb 15.3 oz)   Body mass index is 25.77 kg/m.  General appearance:patient is awake and alert , some dysarthria  Eyes: No scleral icterus HEENT: Atraumatic and Normocephalic Neck: supple, tracheostomy in place Resp:Good air entry bilaterally, some transmitted upper airway sounds CVS: S1 S2 regular, tachycardic GI: Bowel sounds present, Non tender and not distended with no gaurding, rigidity or rebound. Extremities: Left AKA stump-vac in place. Musculoskeletal:No digital cyanosis  I have personally reviewed following labs and imaging studies  LABORATORY DATA: CBC:  Recent Labs Lab 03/21/17 1430 03/22/17 0501 03/23/17 0303 03/24/17 0800 03/26/17 0352  WBC 19.4* 23.8* 20.5* 18.8* 23.7*  HGB 8.8* 8.7* 8.3* 9.5* 9.4*  HCT 30.7* 30.8* 28.8* 33.4* 33.0*  MCV 99.0 100.0 99.7 99.7 100.6*  PLT 555* 483* 486* 441* 453*    Basic Metabolic Panel:  Recent Labs Lab 03/21/17 1430 03/22/17 0501 03/23/17 0303 03/24/17 0800 03/26/17 0352  NA 143 144 144 140 138  K 4.2 4.5 4.3 4.1 4.3  CL 99* 101 100* 94* 95*  CO2 26 27 26 27 27   GLUCOSE 206* 240* 196* 157* 182*  BUN 73* 89* 137* 71* 46*  CREATININE 4.99* 5.81* 7.30* 4.26* 3.28*  CALCIUM 9.4 9.4 9.4 9.9 9.6  PHOS 5.6*  --   --   --   --     GFR: Estimated Creatinine Clearance: 24.1 mL/min (A) (by C-G formula based on SCr of 3.28 mg/dL (H)).  Liver Function Tests:  Recent Labs Lab 03/21/17 1430 03/22/17 0501 03/23/17 0303 03/24/17 0800 03/26/17 0352  AST  --  26 27 34 33  ALT  --  13* 13* 17 20  ALKPHOS  --  326* 298* 350* 355*  BILITOT  --  1.5* 1.4* 1.5* 1.5*  PROT  --  7.4 7.2 8.5* 8.3*  ALBUMIN 2.8* 2.8* 2.7* 3.9 3.4*   No results for input(s): LIPASE, AMYLASE in the last 168 hours. No  results for input(s): AMMONIA in the last 168 hours.  Coagulation Profile: No results for input(s): INR, PROTIME in the last 168 hours.  Cardiac Enzymes:  Recent Labs Lab 03/21/17 1855  TROPONINI 0.04*    BNP (last 3 results) No results for input(s): PROBNP in the last 8760 hours.  HbA1C: No results for input(s): HGBA1C in the last 72 hours.  CBG:  Recent Labs Lab 03/25/17 2022 03/25/17 2344 03/26/17 0534 03/26/17 0826 03/26/17 1209  GLUCAP 184* 166* 229* 177* 217*    Lipid Profile: No results for input(s): CHOL, HDL, LDLCALC, TRIG, CHOLHDL, LDLDIRECT in the last 72 hours.  Thyroid Function Tests: No results for input(s): TSH, T4TOTAL, FREET4, T3FREE, THYROIDAB in the last 72 hours.  Anemia Panel: No results for input(s): VITAMINB12, FOLATE, FERRITIN, TIBC, IRON, RETICCTPCT in the last 72 hours.  Urine analysis:    Component Value Date/Time   COLORURINE AMBER (A) 02/14/2017 2111   APPEARANCEUR HAZY (A) 02/14/2017 2111   LABSPEC 1.015 02/14/2017 2111   PHURINE 5.0 02/14/2017 2111   GLUCOSEU NEGATIVE 02/14/2017 2111   HGBUR SMALL (A) 02/14/2017 2111   BILIRUBINUR NEGATIVE 02/14/2017 2111   KETONESUR NEGATIVE 02/14/2017 2111   PROTEINUR 30 (A) 02/14/2017 2111   UROBILINOGEN 0.2 09/21/2014 1908   NITRITE NEGATIVE 02/14/2017 2111   LEUKOCYTESUR TRACE (A) 02/14/2017 2111    Sepsis Labs: Lactic Acid, Venous    Component Value Date/Time   LATICACIDVEN 4.6 ([place]) 02/14/2017 1807    MICROBIOLOGY: Recent Results (from the past 240 hour(s))  C difficile quick scan w PCR reflex     Status: None   Collection Time: 03/19/17  9:00 AM  Result Value Ref Range Status   C Diff antigen NEGATIVE NEGATIVE Final   C Diff toxin NEGATIVE NEGATIVE Final   C Diff interpretation No C. difficile detected.  Final  Culture, blood (Routine X 2) w Reflex to ID Panel     Status: None (Preliminary result)   Collection Time: 03/23/17  2:06 PM  Result Value Ref Range Status    Specimen Description BLOOD RIGHT WRIST  Final   Special Requests   Final    BOTTLES DRAWN AEROBIC AND ANAEROBIC Blood Culture adequate volume   Culture NO GROWTH 2 DAYS  Final   Report Status PENDING  Incomplete  Culture, blood (Routine X 2) w Reflex to ID Panel     Status: None (Preliminary result)   Collection Time: 03/23/17  2:06 PM  Result Value Ref Range Status   Specimen Description BLOOD RIGHT ARM  Final   Special Requests   Final    BOTTLES DRAWN AEROBIC ONLY Blood Culture adequate volume   Culture NO GROWTH 2 DAYS  Final   Report Status PENDING  Incomplete    RADIOLOGY STUDIES/RESULTS: Ct Abdomen Wo Contrast  Result Date: 03/02/2017 CLINICAL DATA:  Preop for gastrostomy to EXAM: CT ABDOMEN WITHOUT CONTRAST TECHNIQUE: Multidetector CT imaging of the abdomen was performed following the standard protocol without IV contrast. COMPARISON:  None. FINDINGS: Lower chest: Bibasilar dependent and medial consolidation. Large pericardial effusion. Hepatobiliary: The left lobe of the liver is prominent and anterior to the stoma. The gallbladder is not clearly visualized. It may either be decompressed or absent due to cholecystectomy. Pancreas: Unremarkable Spleen: Unremarkable Adrenals/Urinary Tract: Kidneys and adrenal glands  are within normal limits. Stomach/Bowel: The stomach is positioned deep to the liver and colon. Gastrostomy tube placement may be problematic. No evidence of small-bowel obstruction. Feeding tube tip is in the proximal duodenum. Vascular/Lymphatic: Small para-aortic lymph nodes. Atherosclerotic vascular calcifications are noted. No evidence of aortic aneurysm. Other: No free-fluid. Musculoskeletal: No vertebral compression deformity. IMPRESSION: The stomach is positioned deep to a prominent left lobe of the liver as well as the transverse colon. Gas is distension of the stomach and barium opacification of the transverse colon will be essential during the procedure. Bibasilar  pulmonary consolidation. Large pericardial effusion. Electronically Signed   By: Marybelle Killings M.D.   On: 03/02/2017 07:18   Ct Angio Chest Pe W Or Wo Contrast  Result Date: 03/22/2017 CLINICAL DATA:  Tachycardia. EXAM: CT ANGIOGRAPHY CHEST WITH CONTRAST TECHNIQUE: Multidetector CT imaging of the chest was performed using the standard protocol during bolus administration of intravenous contrast. Multiplanar CT image reconstructions and MIPs were obtained to evaluate the vascular anatomy. CONTRAST:  100 cc Isovue COMPARISON:  None. FINDINGS: Cardiovascular: No filling defects within the pulmonary arteries arteries to suggest acute pulmonary embolism. Mediastinum/Nodes: No axillary or supraclavicular adenopathy. No mediastinal adenopathy. Tracheostomy tube extends the mid trachea. Feeding tube extends the stomach esophagus. Lungs/Pleura: Mild basilar atelectasis. No pleural effusion. No infiltrate or overt pulmonary edema. Upper Abdomen: Limited view of the liver, kidneys, pancreas are unremarkable. Normal adrenal glands. Musculoskeletal: No aggressive osseous lesion. Review of the MIP images confirms the above findings. IMPRESSION: 1. No pulmonary embolism. 2. Bibasilar atelectasis. 3. Tracheostomy tube appears in good position Electronically Signed   By: Suzy Bouchard M.D.   On: 03/22/2017 13:52   Ir Fluoro Guide Cv Line Right  Result Date: 03/11/2017 INDICATION: Osteomyelitis EXAM: TUNNELED RIGHT JUGULAR PICC LINE PLACEMENT WITH ULTRASOUND AND FLUOROSCOPIC GUIDANCE MEDICATIONS: None. ANESTHESIA/SEDATION: None FLUOROSCOPY TIME:  Fluoroscopy Time:  minutes 30 seconds (1 mGy). COMPLICATIONS: None immediate. PROCEDURE: The patient was advised of the possible risks and complications and agreed to undergo the procedure. The patient was then brought to the angiographic suite for the procedure. The right neck was prepped with chlorhexidine, draped in the usual sterile fashion using maximum barrier technique (cap  and mask, sterile gown, sterile gloves, large sterile sheet, hand hygiene and cutaneous antiseptic). Local anesthesia was attained by infiltration with 1% lidocaine. Ultrasound demonstrated patency of the right jugular vein, and this was documented with an image. Under real-time ultrasound guidance, this vein was accessed with a 21 gauge micropuncture needle and image documentation was performed. A long subcutaneous tract was employed. The needle was exchanged over a guidewire for a peel-away sheath through which a 26 cm 5 [place] double lumen power injectable PICC was advanced, and positioned with its tip at the lower SVC/right atrial junction. The cuff was positioned in the subcutaneous tract. Fluoroscopy during the procedure and fluoro spot radiograph confirms appropriate catheter position. The catheter was flushed, secured to the skin with Prolene sutures, and covered with a sterile dressing. IMPRESSION: Successful placement of a tunneled right jugular PICC with sonographic and fluoroscopic guidance. The catheter is ready for use. Electronically Signed   By: Marybelle Killings M.D.   On: 03/11/2017 15:31   Ir US Guide Vasc Access Right  Result Date: 03/11/2017 INDICATION: Osteomyelitis EXAM: TUNNELED RIGHT JUGULAR PICC LINE PLACEMENT WITH ULTRASOUND AND FLUOROSCOPIC GUIDANCE MEDICATIONS: None. ANESTHESIA/SEDATION: None FLUOROSCOPY TIME:  Fluoroscopy Time:  minutes 30 seconds (1 mGy). COMPLICATIONS: None immediate. PROCEDURE: The patient was advised of the  possible risks and complications and agreed to undergo the procedure. The patient was then brought to the angiographic suite for the procedure. The right neck was prepped with chlorhexidine, draped in the usual sterile fashion using maximum barrier technique (cap and mask, sterile gown, sterile gloves, large sterile sheet, hand hygiene and cutaneous antiseptic). Local anesthesia was attained by infiltration with 1% lidocaine. Ultrasound demonstrated patency of the  right jugular vein, and this was documented with an image. Under real-time ultrasound guidance, this vein was accessed with a 21 gauge micropuncture needle and image documentation was performed. A long subcutaneous tract was employed. The needle was exchanged over a guidewire for a peel-away sheath through which a 26 cm 5 [place] double lumen power injectable PICC was advanced, and positioned with its tip at the lower SVC/right atrial junction. The cuff was positioned in the subcutaneous tract. Fluoroscopy during the procedure and fluoro spot radiograph confirms appropriate catheter position. The catheter was flushed, secured to the skin with Prolene sutures, and covered with a sterile dressing. IMPRESSION: Successful placement of a tunneled right jugular PICC with sonographic and fluoroscopic guidance. The catheter is ready for use. Electronically Signed   By: Marybelle Killings M.D.   On: 03/11/2017 15:31   Dg Chest Port 1 View  Result Date: 03/26/2017 CLINICAL DATA:  Aspiration EXAM: PORTABLE CHEST 1 VIEW COMPARISON:  03/21/2017 FINDINGS: Tracheostomy is unchanged. Interval removal of right Central line and feeding tube. Cardiomegaly. Increasing interstitial prominence within the lungs could reflect interstitial edema. No effusions or acute bony abnormality. IMPRESSION: Stable cardiomegaly. Increasing interstitial prominence, question interstitial edema. Electronically Signed   By: Rolm Baptise M.D.   On: 03/26/2017 09:43   Dg Chest Port 1 View  Result Date: 03/21/2017 CLINICAL DATA:  Fever. EXAM: PORTABLE CHEST 1 VIEW COMPARISON:  March 17, 2017 FINDINGS: A tracheostomy tube is stable. A feeding tube terminates below today's film. A right-sided line terminates in the right atrium, unchanged. Stable cardiomegaly. No pneumothorax. No pulmonary nodules, masses, or focal infiltrates. IMPRESSION: Stable support apparatus. Cardiomegaly without overt edema. No change. Electronically Signed   By: Dorise Bullion III M.D    On: 03/21/2017 14:56   Dg Chest Port 1 View  Result Date: 03/17/2017 CLINICAL DATA:  Shortness of Breath EXAM: PORTABLE CHEST 1 VIEW COMPARISON:  03/11/2017 FINDINGS: Cardiac shadow is enlarged but stable. Tracheostomy tube, feeding catheter and right-sided jugular central line are noted. The lungs are poorly aerated but no focal infiltrate is seen. Mild crowding of the vascular markings is noted related to the poor inspiratory effort. No bony abnormality is seen. IMPRESSION: Stable cardiomegaly. Poor inspiratory effort although no focal infiltrate is seen. Electronically Signed   By: Inez Catalina M.D.   On: 03/17/2017 13:48   Dg Chest Port 1 View  Result Date: 03/11/2017 CLINICAL DATA:  Leukocytosis. EXAM: PORTABLE CHEST 1 VIEW COMPARISON:  03/03/2017 FINDINGS: Patient is rotated to the left. The cardio pericardial silhouette is enlarged. Slight improvement in left base aeration. Tracheostomy tube remains in place. A feeding tube passes into the stomach although the distal tip position is not included on the film. The visualized bony structures of the thorax are intact. Telemetry leads overlie the chest. IMPRESSION: Persistent enlargement of the cardiopericardial silhouette without evidence for overt pulmonary edema or substantial pleural effusion. Electronically Signed   By: Misty [place] M.D.   On: 03/11/2017 08:37   Dg Chest Port 1 View  Result Date: 03/03/2017 CLINICAL DATA:  Respiratory failure. EXAM: PORTABLE CHEST 1  VIEW COMPARISON:  03/01/2017 . FINDINGS: Tracheostomy tube and feeding tube in stable position. Cardiomegaly with mild diffuse interstitial prominence suggesting mild CHF. Persistent atelectasis and consolidation left lower lobe. Small left pleural effusion cannot be excluded. No pneumothorax. IMPRESSION: 1. Tracheostomy tube and feeding tube in stable position. 2. Cardiomegaly with diffuse mild interstitial prominence suggesting mild CHF. 3. Persistent left lower lobe atelectasis  and consolidation. Small left pleural effusion cannot be excluded . Electronically Signed   By: Marcello Moores  Register   On: 03/03/2017 07:09   Dg Chest Port 1 View  Result Date: 03/01/2017 CLINICAL DATA:  Respiratory failure EXAM: PORTABLE CHEST 1 VIEW COMPARISON:  02/27/2017 FINDINGS: 0546 hours. Patient rotated to the left. Tracheostomy tube remains in place. A feeding tube passes into the stomach although the distal tip position is not included on the film. The cardio pericardial silhouette is enlarged. Left base collapse/consolidation again noted. There is pulmonary vascular congestion without overt pulmonary edema. IMPRESSION: Rotated film with cardiomegaly and vascular congestion. Persistent left base collapse/ consolidation. Electronically Signed   By: Misty [place] M.D.   On: 03/01/2017 07:43   Dg Chest Port 1 View  Result Date: 02/27/2017 CLINICAL DATA:  Hypoxia. EXAM: PORTABLE CHEST 1 VIEW COMPARISON:  02/26/2017. FINDINGS: Tracheostomy to in stable position. Stable cardiomegaly. Low lung volumes. Progressive left lower lobe atelectasis and infiltrate. Small left pleural effusion cannot be excluded . IMPRESSION: 1. Tracheostomy tube in stable position. 2. Progressive left lower lobe atelectasis and infiltrate. Small left pleural effusion cannot be excluded . Electronically Signed   By: Marcello Moores  Register   On: 02/27/2017 07:01   Dg Chest Port 1 View  Result Date: 02/26/2017 CLINICAL DATA:  Status post tracheostomy placement. EXAM: PORTABLE CHEST 1 VIEW COMPARISON:  Earlier today. FINDINGS: The endotracheal tube has been removed and replaced with a tracheostomy tube in satisfactory position. The nasogastric tube and esophageal probe have been removed. The left jugular catheter has been removed. No pneumothorax. Grossly stable enlarged cardiac silhouette. Left lower lobe opacity with improvement laterally since 02/25/2017. Clear right lung. Unremarkable bones. IMPRESSION: 1. Tracheostomy tube in  satisfactory position. 2. Left lower lobe atelectasis or pneumonia with mild improvement since 2 days ago. 3. Stable cardiomegaly. Electronically Signed   By: Claudie Revering M.D.   On: 02/26/2017 16:02   Dg Chest Port 1 View  Result Date: 02/26/2017 CLINICAL DATA:  Hypoxia.  Shortness of breath. EXAM: PORTABLE CHEST 1 VIEW COMPARISON:  02/25/2017. FINDINGS: Endotracheal tube, NG tube, left IJ line esophageal probe in stable position. Cardiomegaly with bilateral pulmonary interstitial infiltrates consistent with CHF. Left base atelectasis . Small left pleural effusion. Similar findings noted on prior exam. No pneumothorax. IMPRESSION: 1. Lines and tubes in stable position. 2. Cardiomegaly with bilateral pulmonary interstitial prominence and small left pleural effusion noted consistent CHF. Left base atelectasis. Similar findings noted on prior exam. Electronically Signed   By: [place]   On: 02/26/2017 07:18   Dg Chest Port 1 View  Result Date: 02/25/2017 CLINICAL DATA:  Respiratory failure, intubated patient. Diabetes, cardiomyopathy, morbid obesity, end-stage renal disease. EXAM: PORTABLE CHEST 1 VIEW COMPARISON:  Portable chest x-ray of Feb 24, 2017 FINDINGS: The lungs are adequately inflated. The pulmonary interstitial markings are slightly less prominent today. The pulmonary vascularity remains engorged. The cardiac silhouette remains enlarged. Retrocardiac region on the left is slightly less dense. The endotracheal tube tip lies 5.7 cm above the carina. The esophagogastric tube tip projects below the inferior margin of the image.  The left internal jugular venous catheter tip projects over the proximal SVC. IMPRESSION: Slight interval improvement in pulmonary interstitial edema. Stable cardiomegaly. Persistent left lower lobe atelectasis or pneumonia. The support tubes are in reasonable position. Electronically Signed   By: David  Martinique M.D.   On: 02/25/2017 07:57   Dg Abd Portable 1v  Result  Date: 03/26/2017 CLINICAL DATA:  Feeding tube placement EXAM: PORTABLE ABDOMEN - 1 VIEW COMPARISON:  None. FINDINGS: No feeding tube visualized on this image. Nonobstructive bowel gas pattern. IMPRESSION: No visible feeding tube. Electronically Signed   By: Rolm Baptise M.D.   On: 03/26/2017 09:43   Dg Abd Portable 1v  Result Date: 03/02/2017 CLINICAL DATA:  Encounter for feeding tube placement EXAM: PORTABLE ABDOMEN - 1 VIEW COMPARISON:  Portable exam 1712 hours compared to CT abdomen and pelvis of 03/01/2017 FINDINGS: Feeding tube traverses abdomen with tip projecting over distal antrum near pylorus. Cardiac silhouette appears enlarged. Visualized bowel gas pattern normal. Osseous structures unremarkable. IMPRESSION: Tip of feeding tube projects over distal gastric antrum near pylorus. Electronically Signed   By: Lavonia Dana M.D.   On: 03/02/2017 17:21   Dg Abd Portable 1v  Result Date: 02/27/2017 CLINICAL DATA:  Feeding tube placement EXAM: PORTABLE ABDOMEN - 1 VIEW COMPARISON:  None. FINDINGS: Feeding tube with the tip projecting over the antrum of the stomach. There is no bowel dilatation to suggest obstruction. There is no evidence of pneumoperitoneum, portal venous gas or pneumatosis. There are no pathologic calcifications along the expected course of the ureters. The osseous structures are unremarkable. IMPRESSION: Feeding tube with the tip projecting over the antrum of the stomach. Electronically Signed   By: Kathreen Devoid   On: 02/27/2017 11:05   Dg Swallowing Func-speech Pathology  Result Date: 03/21/2017 Objective Swallowing Evaluation: Type of Study: MBS-Modified Barium Swallow Study Patient Details Name: [name] MRN: [id number] Date of Birth: 1981/09/23 Today's Date: 03/21/2017 Time: SLP Start Time (ACUTE ONLY): 0905-SLP Stop Time (ACUTE ONLY): 0915 SLP Time Calculation (min) (ACUTE ONLY): 10 min Past Medical History: Past Medical History: Diagnosis Date . Anemia  . Arthritis   "left hand"  (09/15/2013) . Asthma  . CHF (congestive heart failure) ([place])  . Chronic bronchitis ([place])   "just about q yr" (09/15/2013) . Chronic kidney disease   "low kidney function" (09/15/2013) ,  T/Th/Sa . COPD (chronic obstructive pulmonary disease) ([place])  . Coronary artery disease  . Hyperlipidemia  . Hypertension  . Migraine   "get them alot" (09/15/2013) . Myocardial infarction [facility]) 04/2015  NSTEMI . Normal coronary arteries   by cardiac catheterization 09/20/13 . Peripheral vascular disease ([place])  . Pneumonia   "couple times; have it now" (09/15/2013) . PONV (postoperative nausea and vomiting)  . Restless legs  . Shortness of breath   "just recently; related to the pneumonia" (09/15/2013) . Type 1 diabetes mellitus ([place])   type 2 Past Surgical History: Past Surgical History: Procedure Laterality Date . AMPUTATION Right 06/25/2016  Procedure: Amputation Right Great Toe at the Metatarsophalangeal Joint;  Surgeon: Newt Minion, MD;  Location: [place];  Service: Orthopedics;  Laterality: Right; . AMPUTATION Bilateral 10/08/2016  Procedure: Bilateral Transmetatarsal Amputation;  Surgeon: Newt Minion, MD;  Location: [place];  Service: Orthopedics;  Laterality: Bilateral; . AMPUTATION Left 03/13/2017  Procedure: AMPUTATION ABOVE KNEE;  Surgeon: Newt Minion, MD;  Location: [place];  Service: Orthopedics;  Laterality: Left; . AV FISTULA PLACEMENT Left 03/27/2015  Procedure: CREATION RADIAL CEPHALIC ARTERIOVENOUS FISTULA;  Surgeon: Angelia Mould, MD;  Location: [place];  Service: Vascular;  Laterality: Left; . AV FISTULA PLACEMENT Left 11/23/2015  Procedure:  LEFT ARM BASILIC VEIN TRANSPOSITION  ;  Surgeon: Angelia Mould, MD;  Location: [place];  Service: Vascular;  Laterality: Left; . CESAREAN SECTION  1999; 2006 . CORONARY ANGIOGRAM  09/20/2013  Procedure: CORONARY ANGIOGRAM;  Surgeon: Lorretta Harp, MD;  Location: [facility] CATH LAB;  Service: Cardiovascular;; . FINGER SURGERY Left 1985  3rd and 4th digits reconstructed  after cut off" (09/15/2013) . IR FLUORO GUIDE CV LINE RIGHT  03/11/2017 . IR US GUIDE VASC ACCESS RIGHT  03/11/2017 . PERIPHERAL VASCULAR CATHETERIZATION N/A 09/05/2016  Procedure: Abdominal Aortogram;  Surgeon: Elam Dutch, MD;  Location: [place] CV LAB;  Service: Cardiovascular;  Laterality: N/A; . PERIPHERAL VASCULAR CATHETERIZATION Bilateral 09/05/2016  Procedure: Lower Extremity Angiography;  Surgeon: Elam Dutch, MD;  Location: [place] CV LAB;  Service: Cardiovascular;  Laterality: Bilateral; . PERIPHERAL VASCULAR CATHETERIZATION Right 09/05/2016  Procedure: Peripheral Vascular Balloon Angioplasty;  Surgeon: Elam Dutch, MD;  Location: [place] CV LAB;  Service: Cardiovascular;  Laterality: Right;  peroneal and AT . SHOULDER ARTHROSCOPY WITH BICEPSTENOTOMY Right 05/10/2015  Procedure: RIGHT SHOULDER ARTHROSCOPY WITH BICEPS TENOTOMY, DEBRIDEMENT LABRAL TEAR;  Surgeon: Tania Ade, MD;  Location: [place];  Service: Orthopedics;  Laterality: Right;  Right shoulder arthroscopy biceps tenotomy, debridement labral tear . STUMP REVISION Left 01/21/2017  Procedure: . Revision Left Transmetatarsal Amputation;  Surgeon: Newt Minion, MD;  Location: [place];  Service: Orthopedics;  Laterality: Left; . TONSILLECTOMY  1997 . TUBAL LIGATION  2006 HPI: Senna Lape a 36 y.o.femalewith a history of ESRD on HD T TH S, COPD/ Asthma , D s/p MI 04/2015, HLD, HTN, CHF, chronic anemia, DM, and a history of L foot partial amputation 09/2016 with revision in 12/2016 for dehiscence, presenting to the ED with worsening LLE pain, swelling, increased drainage at the stump, and chills. ETT 5/19 and trach'd 5/31. Hospital course included cardiac arrest, transfer to ICU No Data Recorded Assessment / Plan / Recommendation CHL IP CLINICAL IMPRESSIONS 03/21/2017 Clinical Impression Unfortunately pts arousal was a major barrier to success on this exam. Pt fully alert during bedside eval the preceeding day, and awake but  drowsy on arrival to radiology. When she was transferred to the chair and positioned, pt could not appropriately sustain arousal without max cues. SLP was able to provide stimuli and tactile cueing for one bite of puree. Pt orally transited this eventually and triggered a weak, ineffective swallow leaving the majority of the bolus in pharynx. With max cues and painful stimuli SLP was able to cue pt to trigger two more swallows to clear bolus from pharynx. At that time the study was discontinued due to risk to pt and inability to progress further. This is likely not indicative of pts function and there is potential for ability to consume PO, but pt will need to be fully alert and participatory. Will follow for further attempts.  SLP Visit Diagnosis Dysphagia, unspecified (R13.10) Attention and concentration deficit following -- Frontal lobe and executive function deficit following -- Impact on safety and function Severe aspiration risk   CHL IP TREATMENT RECOMMENDATION 03/21/2017 Treatment Recommendations Therapy as outlined in treatment plan below   No flowsheet data found. CHL IP DIET RECOMMENDATION 03/21/2017 SLP Diet Recommendations NPO;Alternative means - temporary Liquid Administration via -- Medication Administration -- Compensations -- Postural Changes --   No flowsheet data found.  CHL IP FOLLOW UP RECOMMENDATIONS 03/21/2017 Follow up Recommendations Skilled Nursing facility   [facility] IP FREQUENCY AND DURATION 03/21/2017 Speech Therapy Frequency (ACUTE ONLY) min 2x/week Treatment Duration 2 weeks      CHL IP ORAL PHASE 03/21/2017 Oral Phase Impaired Oral - Pudding Teaspoon -- Oral - Pudding Cup -- Oral - Honey Teaspoon -- Oral - Honey Cup -- Oral - Nectar Teaspoon -- Oral - Nectar Cup -- Oral - Nectar Straw -- Oral - Thin Teaspoon -- Oral - Thin Cup -- Oral - Thin Straw -- Oral - Puree Delayed oral transit;Reduced posterior propulsion Oral - Mech Soft -- Oral - Regular -- Oral - Multi-Consistency -- Oral - Pill --  Oral Phase - Comment --  CHL IP PHARYNGEAL PHASE 03/21/2017 Pharyngeal Phase Impaired Pharyngeal- Pudding Teaspoon -- Pharyngeal -- Pharyngeal- Pudding Cup -- Pharyngeal -- Pharyngeal- Honey Teaspoon -- Pharyngeal -- Pharyngeal- Honey Cup -- Pharyngeal -- Pharyngeal- Nectar Teaspoon -- Pharyngeal -- Pharyngeal- Nectar Cup -- Pharyngeal -- Pharyngeal- Nectar Straw -- Pharyngeal -- Pharyngeal- Thin Teaspoon -- Pharyngeal -- Pharyngeal- Thin Cup -- Pharyngeal -- Pharyngeal- Thin Straw -- Pharyngeal -- Pharyngeal- Puree Reduced laryngeal elevation;Reduced tongue base retraction;Reduced pharyngeal peristalsis;Reduced epiglottic inversion;Delayed swallow initiation-vallecula;Pharyngeal residue - valleculae;Pharyngeal residue - pyriform Pharyngeal -- Pharyngeal- Mechanical Soft -- Pharyngeal -- Pharyngeal- Regular -- Pharyngeal -- Pharyngeal- Multi-consistency -- Pharyngeal -- Pharyngeal- Pill -- Pharyngeal -- Pharyngeal Comment --  No flowsheet data found. No flowsheet data found. De[place], Katherene Ponto 03/21/2017, 9:55 AM                LOS: 40 days   Reyne Dumas, MD  [facility] Pager: [phone]  If 7PM-7AM, please contact night-coverage [email or web address] Password [email or web address] 03/26/2017, 1:39 PM

## 2017-03-26 NOTE — Progress Notes (Signed)
Daily Progress Note   Patient Name: [name]       Date: 03/26/2017 DOB: 1980-10-09  Age: 36 y.o. MRN#: [id number] Attending Physician: Reyne Dumas, MD Primary Care Physician: Vicenta Aly, FNP Admit Date: 02/14/2017  Reason for Consultation/Follow-up: Establishing goals of care  Subjective: Spoke with Kendrick Fries outside the room regarding code status she was receptive to the information and will think about it.  She is unable to make a decision without Tameka.  Kendrick Fries and I discussed that while Niylah's brain function seems improved (she's interactive and following commands) - she keeps having physical set backs - fever of 102, tachycardia, hypotension during HD, a new black spot on her right heel.  We talked about whether or not Cheray will be able to eat and drink enough to survive.  What would Kendrick Fries do if she could not?  If the black spot on her right heel worsens - would they consider another surgery for Shamona.  I asked Kendrick Fries "what is an acceptable quality of life for Margert" ?  Kendrick Fries remained very thoughtful and commented that if things worsened - "I know at some point it has to end"  Kendrick Fries stated that she appreciated it when MDs discussed upsetting issues (surgery, code status) outside the room with her rather than discussing them in front of [place].  She feels strongly that Ova understands what is being said.   Assessment: Very debilitated patient (she's lost 60 lbs), on-going intermittent fevers, recently has not had consistently effective HD due to hypotension and tachycardia.   Patient Profile/HPI:  36 yo female with ESRD on HD, DM1, COPD, CAD, PVD with dry gangrene in her left lower extremitywho was admitted and suffered asystole cardiac arrest. She now has an anoxic brain  injury. 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  Wound vac removed 6/25. Continues to have intermittent fevers.  N/G was removed.  Patient started on pureed foods.    Length of Stay: 40  Current Medications: Scheduled Meds:  . calcium carbonate (dosed in mg elemental calcium)  250 mg of elemental calcium Oral TID  . carvedilol  12.5 mg Oral BID WC  . chlorhexidine gluconate (MEDLINE KIT)  15 mL Mouth Rinse BID  . [START ON 04/02/2017] darbepoetin (ARANESP) injection - DIALYSIS  200 mcg Intravenous Q Thu-HD  . dextrose  1 ampule Intravenous  Once  . [name]'s butt cream   Topical QID  . heparin  5,000 Units Subcutaneous Q8H  . insulin aspart  0-15 Units Subcutaneous Q4H  . mouth rinse  15 mL Mouth Rinse QID  . multivitamin  1 tablet Oral QHS  . pantoprazole sodium  40 mg Per Tube Q24H  . saccharomyces boulardii  250 mg Oral BID  . sodium chloride flush  10-40 mL Intracatheter Q12H    Continuous Infusions:   PRN Meds: acetaminophen (TYLENOL) oral liquid 160 mg/5 mL, albuterol, fentaNYL (SUBLIMAZE) injection, loperamide, metoprolol tartrate, sodium chloride flush  Physical Exam        Awake, appears fatigued and flushed, diaphoretic with fever of 102.8 CV tachycardic Resp on trach collar - no distress Abdomen soft, nd   Vital Signs: BP (!) 101/58   Pulse (!) 132   Temp (!) 102.8 F (39.3 C) (Oral)   Resp (!) 36   Ht 5' 8.5" (1.74 m)   Wt 78 kg (171 lb 15.3 oz)   LMP  (LMP Unknown) Comment: Patient trached  SpO2 98%   BMI 25.77 kg/m  SpO2: SpO2: 98 % O2 Device: O2 Device: Tracheostomy Collar O2 Flow Rate: O2 Flow Rate (L/min): 6 L/min  Intake/output summary:  Intake/Output Summary (Last 24 hours) at 03/26/17 1510 Last data filed at 03/26/17 0930  Gross per 24 hour  Intake              405 ml  Output                3 ml  Net              402 ml   LBM: Last BM Date: 03/26/17 Baseline Weight: Weight: 108 kg (238 lb) Most recent weight: Weight: 78 kg (171 lb 15.3 oz)       Palliative  Assessment/Data:    Flowsheet Rows     Most Recent Value  Intake Tab  Referral Department  Hospitalist  Unit at Time of Referral  ICU  Palliative Care Primary Diagnosis  Nephrology  Date Notified  03/03/17  Palliative Care Type  New Palliative care  Reason for referral  Clarify Goals of Care  Date of Admission  02/14/17  Date first seen by Palliative Care  03/04/17  # of days Palliative referral response time  1 Day(s)  # of days IP prior to Palliative referral  17  Clinical Assessment  Palliative Performance Scale Score  30%  Psychosocial & Spiritual Assessment  Palliative Care Outcomes  Patient/Family meeting held?  Yes  Who was at the meeting?  mother and sister  Palliative Care Outcomes  Clarified goals of care      Patient Active Problem List   Diagnosis Date Noted  . Fever   . Hypoxemia   . Abscess of left leg   . Tracheostomy status ([place])   . Anoxic brain injury ([place])   . Palliative care encounter   . Goals of care, counseling/discussion   . Tachycardia   . DNR (do not resuscitate) discussion 03/04/2017  . Palliative care by specialist 03/04/2017  . Protein calorie malnutrition ([place])   . Acute on chronic respiratory failure with hypoxemia ([place])   . Bacteremia   . Encounter for central line placement   . Elevated liver enzymes   . Jaundice   . Wound dehiscence   . Coma ([place])   . Osteomyelitis ([place]) 02/14/2017  . Pressure injury of skin 02/14/2017  . Cardiac arrest, cause unspecified (  [place])   . Dehiscence of amputation stump ([place])   . Status post transmetatarsal amputation of left foot ([place]) 10/21/2016  . Status post transmetatarsal amputation of right foot ([place]) 10/08/2016  . 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 Diabetic osteomyelitis ([place])   . Type 1 diabetes mellitus with nephropathy ([place])   . Left foot infection 06/20/2016  . ESRD (end stage renal disease) ([place]) 06/20/2016  . Diabetic foot ulcer  ([place]) 06/20/2016  . Chronic pain syndrome 02/24/2016  . Normal coronary arteries 10/12/2015  . NSTEMI- type 2, Troponin 11.2 05/11/2015  . Acute respiratory failure ([place]) 05/10/2015  . History of arthroscopy of right shoulder-05/10/15 05/10/2015  . CKD (chronic kidney disease) stage 5, GFR less than 15 ml/min (H[facility]) 09/21/2014  . Unspecified asthma(493.90) 10/25/2013  . Acute combined systolic and diastolic heart failure ([place]) 09/21/2013  . Non-ischemic cardiomyopathy - by echo 8/16- EF 35-40% 09/16/2013  . Morbid obesity-  09/16/2013  . Uncontrolled type 2 diabetes with renal manifestation ([place]) 09/15/2013  . Normocytic anemia 09/15/2013  . Lower extremity edema 09/15/2013  . Hyperlipidemia   . Hypertension     Palliative Care Plan    Recommendations/Plan:  PMT will continue to follow and continue [place] discussions.   I will update Tame[place] in [place] as well.   Goals of Care and Additional Recommendations:  Limitations on Scope of Treatment: Full Scope Treatment  Code Status:  Full code  Prognosis:   Unable to determine   Discharge Planning:  To Be Determined  Care plan was discussed with Kendrick Fries (Mother)  Thank you for allowing the Palliative Medicine Team to assist in the care of this patient.  Total time spent:  35 min.     Greater than 50%  of this time was spent counseling and coordinating care related to the above assessment and plan.  Imogene Burn, PA-C Palliative Medicine  Please contact Palliative MedicineTeam phone at [phone] for questions and concerns between 7 am - 7 pm.   Please see AMION for individual provider pager numbers.

## 2017-03-26 NOTE — Progress Notes (Signed)
Patient lost IV access, IV team unsuccessful with reinsertion. Dr. Susie Cassette made aware; patient will not have access. Medication orders modified to by mouth.

## 2017-03-26 NOTE — Progress Notes (Signed)
Patient is coughing while getting meds through [facility], secretions thick white with lumps. Med admin stopped & TF disconnected. MD paged for order for placement verification. Pt RR 40s, sats mid 80s. RT paged for additional assistance.

## 2017-03-27 ENCOUNTER — Inpatient Hospital Stay (HOSPITAL_COMMUNITY): Payer: Medicaid Other

## 2017-03-27 DIAGNOSIS — T17908A Unspecified foreign body in respiratory tract, part unspecified causing other injury, initial encounter: Secondary | ICD-10-CM

## 2017-03-27 LAB — GLUCOSE, CAPILLARY
GLUCOSE-CAPILLARY: 232 mg/dL — AB (ref 65–99)
GLUCOSE-CAPILLARY: 207 mg/dL — AB (ref 65–99)
Glucose-Capillary: 223 mg/dL — ABNORMAL HIGH (ref 65–99)
Glucose-Capillary: 170 mg/dL — ABNORMAL HIGH (ref 65–99)
Glucose-Capillary: 353 mg/dL — ABNORMAL HIGH (ref 65–99)
Glucose-Capillary: 252 mg/dL — ABNORMAL HIGH (ref 65–99)

## 2017-03-27 LAB — MRSA PCR SCREENING: MRSA BY PCR: NEGATIVE

## 2017-03-27 MED ORDER — INSULIN GLARGINE 100 UNIT/ML ~~LOC~~ SOLN
8.0000 [IU] | Freq: Every day | SUBCUTANEOUS | Status: DC
  Administered 2017-03-27 – 2017-04-30 (×29): 8 [IU] via SUBCUTANEOUS
  Filled 2017-03-27 (×35): qty 0.08

## 2017-03-27 MED ORDER — DEXTROSE 5 % IV SOLN
1.0000 g | INTRAVENOUS | Status: DC
  Filled 2017-03-27: qty 1

## 2017-03-27 MED ORDER — DEXTROSE 5 % IV SOLN
1.0000 g | Freq: Once | INTRAVENOUS | Status: AC
  Administered 2017-03-27: 1 g via INTRAVENOUS
  Filled 2017-03-27: qty 1

## 2017-03-27 NOTE — Progress Notes (Signed)
Pharmacy Antibiotic Note  [name] is a 36 y.o. female admitted on 02/14/2017 ; now with HCAP  HD pt  Plan: ceftaz 1 g q24h (1st dose now; 2nd dose and further to start tomorrow at 1600 to avoid being given before HD)  Height: 5' 8.5" (174 cm) Weight: 171 lb 15.3 oz (78 kg) IBW/kg (Calculated) : 65.05  Temp (24hrs), Avg:99.3 F (37.4 C), Min:97.5 F (36.4 C), Max:102.8 F (39.3 C)   Recent Labs Lab 03/21/17 1430 03/22/17 0501 03/23/17 0303 03/24/17 0800 03/26/17 0352  WBC 19.4* 23.8* 20.5* 18.8* 23.7*  CREATININE 4.99* 5.81* 7.30* 4.26* 3.28*    Estimated Creatinine Clearance: 24.1 mL/min (A) (by C-G formula based on SCr of 3.28 mg/dL (H)).    Allergies  Allergen Reactions  . Aspirin Anaphylaxis  . Sulfur Hives  . Tramadol Hives and Other (See Comments)    Pt states she feels weird   . Gabapentin Other (See Comments)    Lethargic    Isaac Bliss, PharmD, BCPS, BCCCP Clinical Pharmacist Clinical phone for 03/27/2017 from 7a-3:30p: [phone] If after 3:30p, please call main pharmacy at: [phone] 03/27/2017 8:16 AM

## 2017-03-27 NOTE — Progress Notes (Signed)
Subjective:  Eating soft foods now, NG out  Objective Vital signs in last 24 hours: Vitals:   03/27/17 0550 03/27/17 0733 03/27/17 0807 03/27/17 0812  BP:  (!) 80/60    Pulse:    (!) 128  Resp:  (!) 27  (!) 30  Temp:   (!) 100.7 F (38.2 C)   TempSrc:   Oral   SpO2:    93%  Weight: 78 kg (171 lb 15.3 oz)     Height:       Weight change: 1 kg (2 lb 3.3 oz)  Intake/Output Summary (Last 24 hours) at 03/27/17 1027 Last data filed at 03/27/17 1000  Gross per 24 hour  Intake              180 ml  Output                1 ml  Net              179 ml   Physical Exam: General: tracking some- following some commands- on just O2 via trach Heart: tachy Lungs: coarse BS bilat ant, no rales or wheezing, trying to bite my arm while examining her Abdomen: obese, soft Extremities: R heel w/ new posterior ulcer, dry, no odor; L AKA with wound vac Dialysis Access: avf on left - good thrill and bruit   Dialysis:  [facility] TTS 4h 21mn   F200   114kg   2/2 bath  LUA AVF Iron Sucrose (Venofer) 50 mg IVP During Dialysis 1X Week  Mircera 75 mcg IV q  2 weeks ( last on 02/05/17) Vitamin D (Calcitriol) Oral 0.75 mcg  Po q hd    Summary: Pt is a 36y.o. 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 Anoxic brain injury- has made progress. Eating solids now, NG out.  2. ESRD - on TTS HD, started HD June '17. Plan MWSat HD this week. Has very low fluid intakes, plan no UF with HD.  HD Sat then TTS.  3. Anemia- hgb 9.5 which is up, on max aranesp 4. MBD of CKD - Ca and Phos in range, will dc CaCO3.  No binders. PTH is 88 here.   5. HTN - soft BP's and tachy, no BP meds needed 6. PVD- s/p L AKA- wound vac 7. Volume - down 36kg from admit.  No vol excess  RKelly SplinterMD CNewell Rubbermaidpgr [phone]  03/27/2017, 10:27 AM       Labs: Basic Metabolic Panel:  Recent Labs Lab 03/21/17 1430   03/23/17 0303 03/24/17 0800 03/26/17 0352  NA 143  < > 144 140 138  K 4.2  < > 4.3 4.1 4.3  CL 99*  < > 100* 94* 95*  CO2 26  < > 26 27 27   GLUCOSE 206*  < > 196* 157* 182*  BUN 73*  < > 137* 71* 46*  CREATININE 4.99*  < > 7.30* 4.26* 3.28*  CALCIUM 9.4  < > 9.4 9.9 9.6  PHOS 5.6*  --   --   --   --   < > = values in this interval not displayed. Liver Function Tests:  Recent Labs Lab 03/23/17 0303 03/24/17 0800 03/26/17 0352  AST 27 34 33  ALT 13* 17 20  ALKPHOS 298* 350* 355*  BILITOT 1.4* 1.5* 1.5*  PROT 7.2 8.5* 8.3*  ALBUMIN 2.7* 3.9 3.4*   No results for input(s): LIPASE, AMYLASE in the last 168 hours. No results for input(s): AMMONIA in the last 168 hours. CBC:  Recent Labs Lab 03/21/17 1430 03/22/17 0501 03/23/17 0303 03/24/17 0800 03/26/17 0352  WBC 19.4* 23.8* 20.5* 18.8* 23.7*  HGB 8.8* 8.7* 8.3* 9.5* 9.4*  HCT 30.7* 30.8* 28.8* 33.4* 33.0*  MCV 99.0 100.0 99.7 99.7 100.6*  PLT 555* 483* 486* 441* 453*   Cardiac Enzymes:  Recent Labs Lab 03/21/17 1855  TROPONINI 0.04*   CBG:  Recent Labs Lab 03/26/17 1209 03/26/17 1714 03/26/17 1958 03/26/17 2327 03/27/17 0415  GLUCAP 217* 223* 253* 205* 223*    Iron Studies: No results for input(s): IRON, TIBC, TRANSFERRIN, FERRITIN in the last 72 hours. Studies/Results: Dg Chest Port 1 View  Result Date: 03/27/2017 CLINICAL DATA:  Tachypnea EXAM: PORTABLE CHEST 1 VIEW COMPARISON:  03/26/2017 FINDINGS: Tracheostomy tube is again noted in satisfactory position. Cardiomegaly is again noted. Mild increasing vascular congestion is noted with some interstitial edema. Increasing density in the right lung base is noted likely representing early infiltrate. This is new from the prior exam. No bony abnormality is noted. IMPRESSION: Stable interstitial edema with new right basilar infiltrate identified. Electronically Signed   By: Inez Catalina M.D.   On: 03/27/2017 07:14   Dg Chest Port 1 View  Result Date:  03/26/2017 CLINICAL DATA:  Aspiration EXAM: PORTABLE CHEST 1 VIEW COMPARISON:  03/21/2017 FINDINGS: Tracheostomy is unchanged. Interval removal of right Central line and feeding tube. Cardiomegaly. Increasing interstitial prominence within the lungs could reflect interstitial edema. No effusions or acute bony abnormality. IMPRESSION: Stable cardiomegaly. Increasing interstitial prominence, question interstitial edema. Electronically Signed   By: Rolm Baptise M.D.   On: 03/26/2017 09:43   Dg Abd Portable 1v  Result Date: 03/26/2017 CLINICAL DATA:  Feeding tube placement EXAM: PORTABLE ABDOMEN - 1 VIEW COMPARISON:  None. FINDINGS: No feeding tube visualized on this image. Nonobstructive bowel gas pattern. IMPRESSION: No visible feeding tube. Electronically Signed   By: Rolm Baptise M.D.   On: 03/26/2017 09:43   Medications: Infusions: . [START ON 03/28/2017] cefTAZidime (FORTAZ)  IV      Scheduled Medications: . calcium carbonate (dosed in mg elemental calcium)  250 mg of elemental calcium Oral TID  . carvedilol  12.5 mg Oral BID WC  . chlorhexidine gluconate (MEDLINE KIT)  15 mL Mouth Rinse BID  . [START ON 04/02/2017] darbepoetin (ARANESP) injection - DIALYSIS  200 mcg Intravenous Q Thu-HD  . dextrose  1 ampule Intravenous Once  . [name]'s butt cream   Topical QID  . heparin  5,000 Units Subcutaneous Q8H  . insulin aspart  0-15 Units Subcutaneous Q4H  . mouth rinse  15 mL Mouth Rinse QID  . multivitamin  1 tablet Oral QHS  . pantoprazole sodium  40 mg Per Tube Q24H  . saccharomyces boulardii  250 mg Oral BID    have reviewed scheduled and prn medications.     03/27/2017,10:27 AM  LOS: 41 days

## 2017-03-27 NOTE — Progress Notes (Signed)
Results for MEDORA, HOLIHAN (MRN [id number]) as of 03/27/2017 08:57  Ref. Range 03/26/2017 12:09 03/26/2017 17:14 03/26/2017 19:58 03/26/2017 23:27 03/27/2017 04:15  Glucose-Capillary Latest Ref Range: 65 - 99 mg/dL 248 (H) 250 (H) 037 (H) 205 (H) 223 (H)  Noted that CBGs have been greater than 180 mg/dl.  Recommend adding  dose of Lantus 6-8 units daily if blood sugars continue to be elevated and continuing Novolog correction scale.   Smith Mince RN BSN CDE Diabetes Coordinator Pager: [phone]  8am-5pm

## 2017-03-27 NOTE — Progress Notes (Signed)
   Introduced chaplaincy services.  Will follow, as needed.  

## 2017-03-27 NOTE — Progress Notes (Signed)
Daily Progress Note   Patient Name: [name]       Date: 03/27/2017 DOB: 10-Dec-1980  Age: 36 y.o. MRN#: [id number] Attending Physician: Reyne Dumas, MD Primary Care Physician: Vicenta Aly, FNP Admit Date: 02/14/2017  Reason for Consultation/Follow-up: Establishing goals of care  Subjective: Spoke with mother at bedside.  She understands that [name] has aspiration pneumonia.  She has not spoken today which is unusual and concerning to [name].  [name] would like for Yarissa's diet to be changed to diabetic so that Delva will receive sugar free apple sauce, pudding and light yogurt.    I attempted to call Rileigh's sister [name], in [place].  [name] turns to [name] to help with decisions.  Unfortunately [name] did not answer her phone.  I left a voice mail asking her to call me.  Assessment: BP soft (sys 85), pulse rate in the 120s - 130s. Currently afebrile.  CXR shows Rt basilar opacity - likely aspiration pna - probably from the cor trak as it was backing up before it was removed.  [name] easily takes bites of puree but tires very quickly.  I'm concerned about the following and would like to converse with [name].  Recurrent aspiration  Adequate nutrition intake - I would like to re-discuss PEG  Code status  Decreased ability to tolerate effective HD due to hypotension and tachycardia - likely a poor long term HD candidate.   Patient Profile/HPI: 36 yo female with ESRD on HD, DM1, COPD, CAD, PVD with dry gangrene in her left lower extremitywho was admitted and suffered asystole cardiac arrest. She now has an anoxic brain injury. 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 Wound vac removed 6/25. Continued to have intermittent fevers.  N/G was removed.  Patient started on pureed foods. On 6/28 the patient spiked a  fever.  6/29 CXR shows likely asp pneumonia.  Mother at bedside, [name], has had a stroke.  She will admit that she speaks and thinks slowly.  She has a difficult time getting around physically.   [name] was her care taker - they lived together.  With [place] can not be at home alone.  I'm certain [name] would want to be at [facility] bedside, but unfortunately she has had to live in the hospital while [name] has been here.  When Neeva's hospitalization is over I  anticipate [name] will go to [place] to live with [name].  Length of Stay: 41  Current Medications: Scheduled Meds:  . calcium carbonate (dosed in mg elemental calcium)  250 mg of elemental calcium Oral TID  . carvedilol  12.5 mg Oral BID WC  . chlorhexidine gluconate (MEDLINE KIT)  15 mL Mouth Rinse BID  . [START ON 04/02/2017] darbepoetin (ARANESP) injection - DIALYSIS  200 mcg Intravenous Q Thu-HD  . dextrose  1 ampule Intravenous Once  . [name]'s butt cream   Topical QID  . heparin  5,000 Units Subcutaneous Q8H  . insulin aspart  0-15 Units Subcutaneous Q4H  . insulin glargine  8 Units Subcutaneous Daily  . mouth rinse  15 mL Mouth Rinse QID  . multivitamin  1 tablet Oral QHS  . pantoprazole sodium  40 mg Per Tube Q24H  . saccharomyces boulardii  250 mg Oral BID    Continuous Infusions: . [START ON 03/28/2017] cefTAZidime (FORTAZ)  IV      PRN Meds: acetaminophen (TYLENOL) oral liquid 160 mg/5 mL, albuterol, fentaNYL (SUBLIMAZE) injection, loperamide, metoprolol tartrate  Physical Exam        Chronically ill appearing female, child like, follows commands.  Does not try to speak today. CV tachy (like a freight train) Resp on trach collar Abdomen soft, nt   Vital Signs: BP (!) 84/65   Pulse (!) 136   Temp 99.9 F (37.7 C) (Axillary)   Resp (!) 24   Ht 5' 8.5" (1.74 m)   Wt 78 kg (171 lb 15.3 oz)   LMP  (LMP Unknown) Comment: Patient trached  SpO2 100%   BMI 25.77 kg/m  SpO2: SpO2: 100 % O2 Device:  O2 Device: Tracheostomy Collar O2 Flow Rate: O2 Flow Rate (L/min): 6 L/min  Intake/output summary:  Intake/Output Summary (Last 24 hours) at 03/27/17 1622 Last data filed at 03/27/17 1501  Gross per 24 hour  Intake              180 ml  Output                0 ml  Net              180 ml   LBM: Last BM Date: 03/26/17 Baseline Weight: Weight: 108 kg (238 lb) Most recent weight: Weight: 78 kg (171 lb 15.3 oz)       Palliative Assessment/Data:    Flowsheet Rows     Most Recent Value  Intake Tab  Referral Department  Hospitalist  Unit at Time of Referral  ICU  Palliative Care Primary Diagnosis  Nephrology  Date Notified  03/03/17  Palliative Care Type  New Palliative care  Reason for referral  Clarify Goals of Care  Date of Admission  02/14/17  Date first seen by Palliative Care  03/04/17  # of days Palliative referral response time  1 Day(s)  # of days IP prior to Palliative referral  17  Clinical Assessment  Palliative Performance Scale Score  30%  Psychosocial & Spiritual Assessment  Palliative Care Outcomes  Patient/Family meeting held?  Yes  Who was at the meeting?  mother and sister  Palliative Care Outcomes  Clarified goals of care      Patient Active Problem List   Diagnosis Date Noted  . Fever   . Hypoxemia   . Abscess of left leg   . Tracheostomy status ([place])   . Anoxic brain injury ([place])   . Palliative care encounter   .  Goals of care, counseling/discussion   . Tachycardia   . DNR (do not resuscitate) discussion 03/04/2017  . Palliative care by specialist 03/04/2017  . Protein calorie malnutrition ([place])   . Acute on chronic respiratory failure with hypoxemia ([place])   . Bacteremia   . Encounter for central line placement   . Elevated liver enzymes   . Jaundice   . Wound dehiscence   . Coma ([place])   . Osteomyelitis ([place]) 02/14/2017  . Pressure injury of skin 02/14/2017  . Cardiac arrest, cause unspecified ([place])   . Dehiscence of amputation stump  ([place])   . Status post transmetatarsal amputation of left foot ([place]) 10/21/2016  . Status post transmetatarsal amputation of right foot ([place]) 10/08/2016  . 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 Diabetic osteomyelitis ([place])   . Type 1 diabetes mellitus with nephropathy ([place])   . Left foot infection 06/20/2016  . ESRD (end stage renal disease) ([place]) 06/20/2016  . Diabetic foot ulcer ([place]) 06/20/2016  . Chronic pain syndrome 02/24/2016  . Normal coronary arteries 10/12/2015  . NSTEMI- type 2, Troponin 11.2 05/11/2015  . Acute respiratory failure ([place]) 05/10/2015  . History of arthroscopy of right shoulder-05/10/15 05/10/2015  . CKD (chronic kidney disease) stage 5, GFR less than 15 ml/min ([facility]) 09/21/2014  . Unspecified asthma(493.90) 10/25/2013  . Acute combined systolic and diastolic heart failure ([place]) 09/21/2013  . Non-ischemic cardiomyopathy - by echo 8/16- EF 35-40% 09/16/2013  . Morbid obesity-  09/16/2013  . Uncontrolled type 2 diabetes with renal manifestation ([place]) 09/15/2013  . Normocytic anemia 09/15/2013  . Lower extremity edema 09/15/2013  . Hyperlipidemia   . Hypertension     Palliative Care Plan    Recommendations/Plan:  PMT will continue to follow intermittently to shadow for decline and continue to refine goals of care (with [name]).  Changed diet to diabetic and pureed at [facility] request  Code Status:  Full code  Prognosis:   Unable to determine   Discharge Planning:  To Be Determined  Care plan was discussed with patient's mother.  Thank you for allowing the Palliative Medicine Team to assist in the care of this patient.  Total time spent:  60 min     Greater than 50%  of this time was spent counseling and coordinating care related to the above assessment and plan.  Imogene Burn, PA-C Palliative Medicine  Please contact Palliative MedicineTeam phone at [phone] for questions and concerns  between 7 am - 7 pm.   Please see AMION for individual provider pager numbers.

## 2017-03-27 NOTE — Progress Notes (Signed)
  Speech Language Pathology Treatment: Dysphagia;Passy Muir Speaking valve  Patient Details Name: [name] MRN: [id number] DOB: [birth year] Today's Date: 03/27/2017 Time: 2023-3435 SLP Time Calculation (min) (ACUTE ONLY): 15 min  Assessment / Plan / Recommendation Clinical Impression  Pt nonverbal today.  PMV placed on cuffless #4 trach.  VS remained stable initially - respiratory rate increased to 40s, but quickly declined to mid 20s.  Voice could not be elicited today.  No spontaneous attempts to communicate nor answering questions despite max verbal/tactile cues.  Pt's mother states she has not talked at all today.  Per Dr. Antionette Poles note, there are concerns pt may have aspirated -she is febrile and tachypneic; repeat CXR  6/29 new right basilar infiltrate.  During our session, she tolerated nectar-thick liquids with hand-over-hand assist and allowing only single sips when seated in upright position.  No overt nor subtle s/s of aspiration noted.  Pt defaults to lying toward right side, requiring constant cues/efforts to maintain her in upright position. Reviewed with pt's mother necessity of proper HOB position and slow rate to maximize safety.  She verbalized understanding.  Continue PMV only with full supervision and during meals.  Follow precautions as posted over bed.  SLP will continue to follow for PMV, cognition, and dysphagia.    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 ETT 5/19 and trach'd 5/31. Hospital course included cardiac arrest, transfer to ICU      SLP Plan  Continue with current plan of care       Recommendations  Diet recommendations: Dysphagia 1 (puree);Nectar-thick  liquid Medication Administration: Crushed with puree Supervision: Full supervision/cueing for compensatory strategies Compensations: Slow rate;Small sips/bites Postural Changes and/or Swallow Maneuvers: Seated upright 90 degrees      Patient may use Passy-Muir Speech Valve: Intermittently with supervision;During PO intake/meals PMSV Supervision: Full         Oral Care Recommendations: Oral care BID Follow up Recommendations: Skilled Nursing facility SLP Visit Diagnosis: Dysphagia, pharyngeal phase (R13.13) Plan: Continue with current plan of care       GO               Asaiah Hunnicutt L. Samson Frederic, [place] CCC/SLP Pager [phone]  Blenda Mounts [place] 03/27/2017, 3:20 PM

## 2017-03-27 NOTE — Progress Notes (Signed)
PROGRESS NOTE        PATIENT DETAILS Name: [name] Age: 36 y.o. Sex: female Date of Birth: [birth year] Admit Date: 02/14/2017 Admitting Physician Lady Deutscher, MD [id number], Helene Kelp, FNP  Note-care assumed by me on 6/18  Brief Narrative: Patient is a 36 y.o. female with history of ESRD on HD, poorly controlled diabetes, history of bilateral transmetatarsal foot amputation with subsequent left foot wound dehiscence (refused BKA as outpatient), chronic systolic heart failure felt to be secondary to nonischemic cardiomyopathy admitted on 02/14/17 with wound dehiscence and persistent drainage from the left trans-metatarsal amputation site, she was admitted to the [facility]. She was subsequently found to be unresponsive in her room on 5/19 after getting IV Dilaudid-she was  in asystole, CPR was started, with ROSC in around 9 minutes.She was then transferred to the intensive care unit, and subsequently underwent cooling and then rewarming. She was unable to be liberated off the ventilator for several weeks , and as a result underwent tracheostomy on 5/31. Patient has been evaluated by neurology during this hospital stay, per neurology chances of meaningful neurological recovery is dismal. Hospital course has now been complicated by persistent leukocytosis, respiratory failure, and wound dehiscence with gangrene causing persistent SIRS pathophysiology and now suspected aspiration PNA . 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 Since then the patient has been weaned off the vent and maintained on ATC    Subjective: Fever of 100.7 , tachypnea,tachycardiac     Assessment/Plan: Acute /chronic hypoxemic respiratory failure in a setting of cardiac arrest:  Last ventilator use was on night of 6/19-6/20. On ATC for several days at 28%FIo2,increased overnight to 50% Fio2 .  PCCM following for resp issues -tracheostomy in place. Change to #4 cuffless trach .  Marland Kitchen S/P MBS  6/23,  Now status post FEES 6/27. Patient started on nectar thick liquids and dysphagia 1 diet 6/27. Holding off on PEG for now.   Cortrak removed , 6/28 . Unfortunately patient seems to have aspirated -is febrile and tachypneic ,   repeat CXR  6/29 new right basilar infiltrate.Restarted fortaz 6/29  Tachycardia-sinus tachycardia, occasional SVT, likely secondary to low-grade fever, low blood pressure, not sure if the patient will be a candidate for long-term hemodialysis. Repeat 2-D echo shows EF 25-30%, small pericardial effusion,previously EF was 30-35%  Cardiac arrest on 5/19 with anoxic brain injury:  She seems to be  Cognitively  improving on daily basis, following all major commands. Evaluated by neurology on 5/29-felt to have poor overall prognoses for any meaningful recovery. Since then she has been more interactive     Systemic inflammatory response syndrome:/Persistent leukocytosis   tachycardic-and has persistent leukocytosis white count fluctuating  >20.5>18.8 >23.7  .Initially thought to be  secondary to left foot gangrene/infection and abscess-she is now status post AKA and still continues to have these symptoms. Blood cultures on 6/1, 6/13 continue to be negative.   repeat blood cultures 6/25 ngsf,  Chest x-ray on 6/13 negative for pneumonia. Patient underwent CT chest on 6/24, which was negative for pneumonia, PE.  Ruled out for C. difficile 2,   received multiple courses of antibiotics between 5/19-6/20, She was on vancomycin and Tressie Ellis that was started on 6/14, and discontinued on 6/16 after AKA.  Dr Linus Salmons- recommended  vancomycin and Fortaz through 6/20, now completed .    Central  line discontinued 6/26 due to malfunction Febrile again 6/28, concern for overt aspiration ,confirmed on CXR  6/29, restarted fortaz 6/29  Diarrhea, continues to have loose stools Continue probiotics,attempt to dc flexseal soon  Left trans-metatarsal stump wound dehiscence with infection and gangrene:  Per operative note-pus/abscess noted extending up to the knee when BKA was attempted, subsequently underwent a AKA. Note, initially family was very reluctant to proceed with amputation, but given persistent leukocytosis/fever and potential risk of sepsis, family agreed for amputation, subsequently AKA was done on 6/15.  Orthopedics removed wound VAC 6/20.Requested Dr Sharol Given to revaluate left leg stump 6/28  ESRD: Nephrology following-HD on TTS.    Anemia: Hemoglobin 7.5-9.0  Likely secondary to chronic disease-probably worsened by acute illness. No signs of bleeding, hemoglobin has been stable around 8.2-8.5  DM-2: CBGs elevated -continue with SSI. Check a1c, started lantus 8 units due to elevated CBG's   Chronic systolic heart failure/nonischemic cardiomyopathy (EF 30-35% by TEE on 6/4): Volume status remained stable-this is managed with dialysis-continue with Coreg.   Pericardial effusion: Seen on TEE-with no evidence of tamponade pathophysiology. Repeat echo  6/26 no significant pericardial effusion  Moderate protein calorie malnutrition: Started PO's   6/27   Hypokalemia-stable  Goals of care:  Prognosis is extremely guarded, need to make a decision about continuing hemodialysis, long term  abx use    Telemetry (independently reviewed): Sinus tachycardia  Studies: Lower extremity ultrasound 5/18 >> no evidence of DVT CT head 5/19 >> normal exam Echo 5/20 >> EF 30-35%, increased LVF. Diffuse hypokinesis, akinesis of basilar mid-inferior myocardium, grade 2 diastolic dysfxn  EEG 1/88 >> finding c/w mod to severe global cerebral dysfxn. C/w anoxic injury given clinical course  LE [place] 5/21 >> negative MRI brain 5/24 >> ?thrombosed cortical vein, otherwise normal TEE 6/4 >> mild MR, normal AV, mild TR, mild PR, EF 30-35%, diffuse hypokinesis, no thrombus, no PFO, normal RV, moderate pericardial effusion with synechia suggesting some chronicity, no vegetations   Echo (reviewed):EF  EF: 25%  -   30%, A trivial pericardial effusion      DVT Prophylaxis: Prophylactic Heparin   Code Status: Full code   Family Communication: Mother at bedside, discussed on a daily basis  Disposition Plan: Remain inpatient in SDU  Antimicrobial agents: Anti-infectives    Start     Dose/Rate Route Frequency Ordered Stop   03/28/17 1600  cefTAZidime (FORTAZ) 1 g in dextrose 5 % 50 mL IVPB     1 g 100 mL/hr over 30 Minutes Intravenous Every 24 hours 03/27/17 0814     03/27/17 0815  cefTAZidime (FORTAZ) 1 g in dextrose 5 % 50 mL IVPB     1 g 100 mL/hr over 30 Minutes Intravenous  Once 03/27/17 0814 03/27/17 1026   03/17/17 1800  cefTAZidime (FORTAZ) 2 g in dextrose 5 % 50 mL IVPB  Status:  Discontinued     2 g 100 mL/hr over 30 Minutes Intravenous Every T-Th-Sa (1800) 03/15/17 1143 03/18/17 1223   03/17/17 1200  vancomycin (VANCOCIN) IVPB 1000 mg/200 mL premix  Status:  Discontinued     1,000 mg 200 mL/hr over 60 Minutes Intravenous Every T-Th-Sa (Hemodialysis) 03/15/17 1143 03/18/17 1223   03/14/17 1200  vancomycin (VANCOCIN) IVPB 1000 mg/200 mL premix  Status:  Discontinued     1,000 mg 200 mL/hr over 60 Minutes Intravenous Every T-Th-Sa (Hemodialysis) 03/12/17 0912 03/14/17 1542   03/13/17 1430  ceFAZolin (ANCEF) IVPB 2g/100 mL premix  Status:  Discontinued  2 g 200 mL/hr over 30 Minutes Intravenous To ShortStay Surgical 03/12/17 1208 03/13/17 1113   03/13/17 1115  ceFAZolin (ANCEF) IVPB 2g/100 mL premix     2 g 200 mL/hr over 30 Minutes Intravenous To Surgery 03/13/17 1108 03/13/17 1506   03/12/17 1800  cefTAZidime (FORTAZ) 2 g in dextrose 5 % 50 mL IVPB  Status:  Discontinued     2 g 100 mL/hr over 30 Minutes Intravenous Every T-Th-Sa (1800) 03/12/17 0912 03/14/17 1542   03/12/17 1200  vancomycin (VANCOCIN) 1,500 mg in sodium chloride 0.9 % 250 mL IVPB     1,500 mg 250 mL/hr over 60 Minutes Intravenous Every Thu (Hemodialysis) 03/12/17 0912 03/12/17 1356   03/02/17 1200   cefTRIAXone (ROCEPHIN) 2 g in dextrose 5 % 50 mL IVPB     2 g 100 mL/hr over 30 Minutes Intravenous Every 24 hours 03/02/17 0810 03/06/17 1358   03/02/17 1200  vancomycin (VANCOCIN) IVPB 1000 mg/200 mL premix     1,000 mg 200 mL/hr over 60 Minutes Intravenous Every M-W-F (Hemodialysis) 03/02/17 0815 03/02/17 1457   03/02/17 1200  metroNIDAZOLE (FLAGYL) IVPB 500 mg     500 mg 100 mL/hr over 60 Minutes Intravenous Every 8 hours 03/02/17 0948 03/06/17 2230   02/26/17 1200  vancomycin (VANCOCIN) IVPB 1000 mg/200 mL premix     1,000 mg 200 mL/hr over 60 Minutes Intravenous Every T-Th-Sa (Hemodialysis) 02/25/17 1151 03/05/17 1325   02/25/17 1200  vancomycin (VANCOCIN) 2,000 mg in sodium chloride 0.9 % 500 mL IVPB     2,000 mg 250 mL/hr over 120 Minutes Intravenous  Once 02/25/17 1149 02/25/17 1449   02/25/17 0900  cefTRIAXone (ROCEPHIN) 2 g in dextrose 5 % 50 mL IVPB  Status:  Discontinued     2 g 100 mL/hr over 30 Minutes Intravenous Every 24 hours 02/25/17 0814 03/02/17 0810   02/25/17 0900  metroNIDAZOLE (FLAGYL) IVPB 500 mg  Status:  Discontinued     500 mg 100 mL/hr over 60 Minutes Intravenous Every 8 hours 02/25/17 0814 03/02/17 0948   02/20/17 1200  vancomycin (VANCOCIN) IVPB 1000 mg/200 mL premix  Status:  Discontinued     1,000 mg 200 mL/hr over 60 Minutes Intravenous Every M-W-F (Hemodialysis) 02/19/17 1506 02/20/17 1403   02/17/17 1800  meropenem (MERREM) 500 mg in sodium chloride 0.9 % 50 mL IVPB  Status:  Discontinued     500 mg 100 mL/hr over 30 Minutes Intravenous Daily-1800 02/17/17 1026 02/25/17 0814   02/17/17 1200  vancomycin (VANCOCIN) IVPB 1000 mg/200 mL premix     1,000 mg 200 mL/hr over 60 Minutes Intravenous Every T-Th-Sa (Hemodialysis) 02/14/17 1758 02/19/17 1612   02/14/17 2200  clindamycin (CLEOCIN) IVPB 900 mg  Status:  Discontinued     900 mg 100 mL/hr over 30 Minutes Intravenous Every 8 hours 02/14/17 1106 02/14/17 1928   02/14/17 2000  clindamycin (CLEOCIN)  IVPB 900 mg  Status:  Discontinued     900 mg 100 mL/hr over 30 Minutes Intravenous Every 8 hours 02/14/17 1928 02/16/17 1047   02/14/17 1915  piperacillin-tazobactam (ZOSYN) IVPB 3.375 g  Status:  Discontinued     3.375 g 100 mL/hr over 30 Minutes Intravenous Every 12 hours 02/14/17 1801 02/17/17 1026   02/14/17 1845  vancomycin (VANCOCIN) 2,000 mg in sodium chloride 0.9 % 500 mL IVPB     2,000 mg 250 mL/hr over 120 Minutes Intravenous  Once 02/14/17 1758 02/14/17 2107   02/14/17 1115  clindamycin (CLEOCIN) IVPB  900 mg  Status:  Discontinued     900 mg 100 mL/hr over 30 Minutes Intravenous  Once 02/14/17 1106 02/14/17 2236   02/14/17 0945  clindamycin (CLEOCIN) IVPB 600 mg     600 mg 100 mL/hr over 30 Minutes Intravenous  Once 02/14/17 0931 02/14/17 1013      Procedures: ETT 5/19 >> 5/31 Lt IJ CVL 5/19 >> out Trach 5/31 >>  TEE 6/4 >> mild MR, normal AV, mild TR, mild PR, EF 30-35%, diffuse hypokinesis, no thrombus, no PFO, normal RV, moderate pericardial effusion with synechia suggesting some chronicity, no vegetations  Rt IJ TLC 6/13>> Left AKA 6/15>>  CONSULTS:  Infectious disease:  Neurology  Gastroenterology  Wound care  PCCM  Nephrology  Orthopedics  Time spent: 30 minutes-Greater than 50% of this time was spent in counseling, explanation of diagnosis, planning of further management, and coordination of care.  MEDICATIONS: Scheduled Meds: . calcium carbonate (dosed in mg elemental calcium)  250 mg of elemental calcium Oral TID  . carvedilol  12.5 mg Oral BID WC  . chlorhexidine gluconate (MEDLINE KIT)  15 mL Mouth Rinse BID  . [START ON 04/02/2017] darbepoetin (ARANESP) injection - DIALYSIS  200 mcg Intravenous Q Thu-HD  . dextrose  1 ampule Intravenous Once  . [name]'s butt cream   Topical QID  . heparin  5,000 Units Subcutaneous Q8H  . insulin aspart  0-15 Units Subcutaneous Q4H  . mouth rinse  15 mL Mouth Rinse QID  . multivitamin  1 tablet Oral QHS    . pantoprazole sodium  40 mg Per Tube Q24H  . saccharomyces boulardii  250 mg Oral BID   Continuous Infusions: . [START ON 03/28/2017] cefTAZidime (FORTAZ)  IV     PRN Meds:.acetaminophen (TYLENOL) oral liquid 160 mg/5 mL, albuterol, fentaNYL (SUBLIMAZE) injection, loperamide, metoprolol tartrate   PHYSICAL EXAM: Vital signs: Vitals:   03/27/17 0733 03/27/17 0807 03/27/17 0812 03/27/17 1139  BP: (!) 80/60     Pulse:   (!) 128 (!) 136  Resp: (!) 27  (!) 30 (!) 41  Temp:  (!) 100.7 F (38.2 C)    TempSrc:  Oral    SpO2:   93% 98%  Weight:      Height:       Filed Weights   03/25/17 1500 03/26/17 0500 03/27/17 0550  Weight: 77 kg (169 lb 12.1 oz) 78 kg (171 lb 15.3 oz) 78 kg (171 lb 15.3 oz)   Body mass index is 25.77 kg/m.  General appearance:patient is awake and alert , some dysarthria  Eyes: No scleral icterus HEENT: Atraumatic and Normocephalic Neck: supple, tracheostomy in place Resp:Good air entry bilaterally, some transmitted upper airway sounds CVS: S1 S2 regular, tachycardic GI: Bowel sounds present, Non tender and not distended with no gaurding, rigidity or rebound. Extremities: Left AKA stump-vac in place. Musculoskeletal:No digital cyanosis  I have personally reviewed following labs and imaging studies  LABORATORY DATA: CBC:  Recent Labs Lab 03/21/17 1430 03/22/17 0501 03/23/17 0303 03/24/17 0800 03/26/17 0352  WBC 19.4* 23.8* 20.5* 18.8* 23.7*  HGB 8.8* 8.7* 8.3* 9.5* 9.4*  HCT 30.7* 30.8* 28.8* 33.4* 33.0*  MCV 99.0 100.0 99.7 99.7 100.6*  PLT 555* 483* 486* 441* 453*    Basic Metabolic Panel:  Recent Labs Lab 03/21/17 1430 03/22/17 0501 03/23/17 0303 03/24/17 0800 03/26/17 0352  NA 143 144 144 140 138  K 4.2 4.5 4.3 4.1 4.3  CL 99* 101 100* 94* 95*  CO2 26 27  26 27 27   GLUCOSE 206* 240* 196* 157* 182*  BUN 73* 89* 137* 71* 46*  CREATININE 4.99* 5.81* 7.30* 4.26* 3.28*  CALCIUM 9.4 9.4 9.4 9.9 9.6  PHOS 5.6*  --   --   --   --      GFR: Estimated Creatinine Clearance: 24.1 mL/min (A) (by C-G formula based on SCr of 3.28 mg/dL (H)).  Liver Function Tests:  Recent Labs Lab 03/21/17 1430 03/22/17 0501 03/23/17 0303 03/24/17 0800 03/26/17 0352  AST  --  26 27 34 33  ALT  --  13* 13* 17 20  ALKPHOS  --  326* 298* 350* 355*  BILITOT  --  1.5* 1.4* 1.5* 1.5*  PROT  --  7.4 7.2 8.5* 8.3*  ALBUMIN 2.8* 2.8* 2.7* 3.9 3.4*   No results for input(s): LIPASE, AMYLASE in the last 168 hours. No results for input(s): AMMONIA in the last 168 hours.  Coagulation Profile: No results for input(s): INR, PROTIME in the last 168 hours.  Cardiac Enzymes:  Recent Labs Lab 03/21/17 1855  TROPONINI 0.04*    BNP (last 3 results) No results for input(s): PROBNP in the last 8760 hours.  HbA1C: No results for input(s): HGBA1C in the last 72 hours.  CBG:  Recent Labs Lab 03/26/17 1958 03/26/17 2327 03/27/17 0415 03/27/17 0756 03/27/17 1203  GLUCAP 253* 205* 223* 170* 353*    Lipid Profile: No results for input(s): CHOL, HDL, LDLCALC, TRIG, CHOLHDL, LDLDIRECT in the last 72 hours.  Thyroid Function Tests: No results for input(s): TSH, T4TOTAL, FREET4, T3FREE, THYROIDAB in the last 72 hours.  Anemia Panel: No results for input(s): VITAMINB12, FOLATE, FERRITIN, TIBC, IRON, RETICCTPCT in the last 72 hours.  Urine analysis:    Component Value Date/Time   COLORURINE AMBER (A) 02/14/2017 2111   APPEARANCEUR HAZY (A) 02/14/2017 2111   LABSPEC 1.015 02/14/2017 2111   PHURINE 5.0 02/14/2017 2111   GLUCOSEU NEGATIVE 02/14/2017 2111   HGBUR SMALL (A) 02/14/2017 2111   BILIRUBINUR NEGATIVE 02/14/2017 2111   KETONESUR NEGATIVE 02/14/2017 2111   PROTEINUR 30 (A) 02/14/2017 2111   UROBILINOGEN 0.2 09/21/2014 1908   NITRITE NEGATIVE 02/14/2017 2111   LEUKOCYTESUR TRACE (A) 02/14/2017 2111    Sepsis Labs: Lactic Acid, Venous    Component Value Date/Time   LATICACIDVEN 4.6 ([place]) 02/14/2017 1807     MICROBIOLOGY: Recent Results (from the past 240 hour(s))  C difficile quick scan w PCR reflex     Status: None   Collection Time: 03/19/17  9:00 AM  Result Value Ref Range Status   C Diff antigen NEGATIVE NEGATIVE Final   C Diff toxin NEGATIVE NEGATIVE Final   C Diff interpretation No C. difficile detected.  Final  Culture, blood (Routine X 2) w Reflex to ID Panel     Status: None (Preliminary result)   Collection Time: 03/23/17  2:06 PM  Result Value Ref Range Status   Specimen Description BLOOD RIGHT WRIST  Final   Special Requests   Final    BOTTLES DRAWN AEROBIC AND ANAEROBIC Blood Culture adequate volume   Culture NO GROWTH 3 DAYS  Final   Report Status PENDING  Incomplete  Culture, blood (Routine X 2) w Reflex to ID Panel     Status: None (Preliminary result)   Collection Time: 03/23/17  2:06 PM  Result Value Ref Range Status   Specimen Description BLOOD RIGHT ARM  Final   Special Requests   Final    BOTTLES DRAWN AEROBIC  ONLY Blood Culture adequate volume   Culture NO GROWTH 3 DAYS  Final   Report Status PENDING  Incomplete    RADIOLOGY STUDIES/RESULTS: Ct Abdomen Wo Contrast  Result Date: 03/02/2017 CLINICAL DATA:  Preop for gastrostomy to EXAM: CT ABDOMEN WITHOUT CONTRAST TECHNIQUE: Multidetector CT imaging of the abdomen was performed following the standard protocol without IV contrast. COMPARISON:  None. FINDINGS: Lower chest: Bibasilar dependent and medial consolidation. Large pericardial effusion. Hepatobiliary: The left lobe of the liver is prominent and anterior to the stoma. The gallbladder is not clearly visualized. It may either be decompressed or absent due to cholecystectomy. Pancreas: Unremarkable Spleen: Unremarkable Adrenals/Urinary Tract: Kidneys and adrenal glands are within normal limits. Stomach/Bowel: The stomach is positioned deep to the liver and colon. Gastrostomy tube placement may be problematic. No evidence of small-bowel obstruction. Feeding tube  tip is in the proximal duodenum. Vascular/Lymphatic: Small para-aortic lymph nodes. Atherosclerotic vascular calcifications are noted. No evidence of aortic aneurysm. Other: No free-fluid. Musculoskeletal: No vertebral compression deformity. IMPRESSION: The stomach is positioned deep to a prominent left lobe of the liver as well as the transverse colon. Gas is distension of the stomach and barium opacification of the transverse colon will be essential during the procedure. Bibasilar pulmonary consolidation. Large pericardial effusion. Electronically Signed   By: Marybelle Killings M.D.   On: 03/02/2017 07:18   Ct Angio Chest Pe W Or Wo Contrast  Result Date: 03/22/2017 CLINICAL DATA:  Tachycardia. EXAM: CT ANGIOGRAPHY CHEST WITH CONTRAST TECHNIQUE: Multidetector CT imaging of the chest was performed using the standard protocol during bolus administration of intravenous contrast. Multiplanar CT image reconstructions and MIPs were obtained to evaluate the vascular anatomy. CONTRAST:  100 cc Isovue COMPARISON:  None. FINDINGS: Cardiovascular: No filling defects within the pulmonary arteries arteries to suggest acute pulmonary embolism. Mediastinum/Nodes: No axillary or supraclavicular adenopathy. No mediastinal adenopathy. Tracheostomy tube extends the mid trachea. Feeding tube extends the stomach esophagus. Lungs/Pleura: Mild basilar atelectasis. No pleural effusion. No infiltrate or overt pulmonary edema. Upper Abdomen: Limited view of the liver, kidneys, pancreas are unremarkable. Normal adrenal glands. Musculoskeletal: No aggressive osseous lesion. Review of the MIP images confirms the above findings. IMPRESSION: 1. No pulmonary embolism. 2. Bibasilar atelectasis. 3. Tracheostomy tube appears in good position Electronically Signed   By: Suzy Bouchard M.D.   On: 03/22/2017 13:52   Ir Fluoro Guide Cv Line Right  Result Date: 03/11/2017 INDICATION: Osteomyelitis EXAM: TUNNELED RIGHT JUGULAR PICC LINE PLACEMENT  WITH ULTRASOUND AND FLUOROSCOPIC GUIDANCE MEDICATIONS: None. ANESTHESIA/SEDATION: None FLUOROSCOPY TIME:  Fluoroscopy Time:  minutes 30 seconds (1 mGy). COMPLICATIONS: None immediate. PROCEDURE: The patient was advised of the possible risks and complications and agreed to undergo the procedure. The patient was then brought to the angiographic suite for the procedure. The right neck was prepped with chlorhexidine, draped in the usual sterile fashion using maximum barrier technique (cap and mask, sterile gown, sterile gloves, large sterile sheet, hand hygiene and cutaneous antiseptic). Local anesthesia was attained by infiltration with 1% lidocaine. Ultrasound demonstrated patency of the right jugular vein, and this was documented with an image. Under real-time ultrasound guidance, this vein was accessed with a 21 gauge micropuncture needle and image documentation was performed. A long subcutaneous tract was employed. The needle was exchanged over a guidewire for a peel-away sheath through which a 26 cm 5 [place] double lumen power injectable PICC was advanced, and positioned with its tip at the lower SVC/right atrial junction. The cuff was positioned in the  subcutaneous tract. Fluoroscopy during the procedure and fluoro spot radiograph confirms appropriate catheter position. The catheter was flushed, secured to the skin with Prolene sutures, and covered with a sterile dressing. IMPRESSION: Successful placement of a tunneled right jugular PICC with sonographic and fluoroscopic guidance. The catheter is ready for use. Electronically Signed   By: Marybelle Killings M.D.   On: 03/11/2017 15:31   Ir US Guide Vasc Access Right  Result Date: 03/11/2017 INDICATION: Osteomyelitis EXAM: TUNNELED RIGHT JUGULAR PICC LINE PLACEMENT WITH ULTRASOUND AND FLUOROSCOPIC GUIDANCE MEDICATIONS: None. ANESTHESIA/SEDATION: None FLUOROSCOPY TIME:  Fluoroscopy Time:  minutes 30 seconds (1 mGy). COMPLICATIONS: None immediate. PROCEDURE: The  patient was advised of the possible risks and complications and agreed to undergo the procedure. The patient was then brought to the angiographic suite for the procedure. The right neck was prepped with chlorhexidine, draped in the usual sterile fashion using maximum barrier technique (cap and mask, sterile gown, sterile gloves, large sterile sheet, hand hygiene and cutaneous antiseptic). Local anesthesia was attained by infiltration with 1% lidocaine. Ultrasound demonstrated patency of the right jugular vein, and this was documented with an image. Under real-time ultrasound guidance, this vein was accessed with a 21 gauge micropuncture needle and image documentation was performed. A long subcutaneous tract was employed. The needle was exchanged over a guidewire for a peel-away sheath through which a 26 cm 5 [place] double lumen power injectable PICC was advanced, and positioned with its tip at the lower SVC/right atrial junction. The cuff was positioned in the subcutaneous tract. Fluoroscopy during the procedure and fluoro spot radiograph confirms appropriate catheter position. The catheter was flushed, secured to the skin with Prolene sutures, and covered with a sterile dressing. IMPRESSION: Successful placement of a tunneled right jugular PICC with sonographic and fluoroscopic guidance. The catheter is ready for use. Electronically Signed   By: Marybelle Killings M.D.   On: 03/11/2017 15:31   Dg Chest Port 1 View  Result Date: 03/27/2017 CLINICAL DATA:  Tachypnea EXAM: PORTABLE CHEST 1 VIEW COMPARISON:  03/26/2017 FINDINGS: Tracheostomy tube is again noted in satisfactory position. Cardiomegaly is again noted. Mild increasing vascular congestion is noted with some interstitial edema. Increasing density in the right lung base is noted likely representing early infiltrate. This is new from the prior exam. No bony abnormality is noted. IMPRESSION: Stable interstitial edema with new right basilar infiltrate identified.  Electronically Signed   By: Inez Catalina M.D.   On: 03/27/2017 07:14   Dg Chest Port 1 View  Result Date: 03/26/2017 CLINICAL DATA:  Aspiration EXAM: PORTABLE CHEST 1 VIEW COMPARISON:  03/21/2017 FINDINGS: Tracheostomy is unchanged. Interval removal of right Central line and feeding tube. Cardiomegaly. Increasing interstitial prominence within the lungs could reflect interstitial edema. No effusions or acute bony abnormality. IMPRESSION: Stable cardiomegaly. Increasing interstitial prominence, question interstitial edema. Electronically Signed   By: Rolm Baptise M.D.   On: 03/26/2017 09:43   Dg Chest Port 1 View  Result Date: 03/21/2017 CLINICAL DATA:  Fever. EXAM: PORTABLE CHEST 1 VIEW COMPARISON:  March 17, 2017 FINDINGS: A tracheostomy tube is stable. A feeding tube terminates below today's film. A right-sided line terminates in the right atrium, unchanged. Stable cardiomegaly. No pneumothorax. No pulmonary nodules, masses, or focal infiltrates. IMPRESSION: Stable support apparatus. Cardiomegaly without overt edema. No change. Electronically Signed   By: Dorise Bullion III M.D   On: 03/21/2017 14:56   Dg Chest Port 1 View  Result Date: 03/17/2017 CLINICAL DATA:  Shortness of Breath EXAM:  PORTABLE CHEST 1 VIEW COMPARISON:  03/11/2017 FINDINGS: Cardiac shadow is enlarged but stable. Tracheostomy tube, feeding catheter and right-sided jugular central line are noted. The lungs are poorly aerated but no focal infiltrate is seen. Mild crowding of the vascular markings is noted related to the poor inspiratory effort. No bony abnormality is seen. IMPRESSION: Stable cardiomegaly. Poor inspiratory effort although no focal infiltrate is seen. Electronically Signed   By: Inez Catalina M.D.   On: 03/17/2017 13:48   Dg Chest Port 1 View  Result Date: 03/11/2017 CLINICAL DATA:  Leukocytosis. EXAM: PORTABLE CHEST 1 VIEW COMPARISON:  03/03/2017 FINDINGS: Patient is rotated to the left. The cardio pericardial  silhouette is enlarged. Slight improvement in left base aeration. Tracheostomy tube remains in place. A feeding tube passes into the stomach although the distal tip position is not included on the film. The visualized bony structures of the thorax are intact. Telemetry leads overlie the chest. IMPRESSION: Persistent enlargement of the cardiopericardial silhouette without evidence for overt pulmonary edema or substantial pleural effusion. Electronically Signed   By: Misty Stanley M.D.   On: 03/11/2017 08:37   Dg Chest Port 1 View  Result Date: 03/03/2017 CLINICAL DATA:  Respiratory failure. EXAM: PORTABLE CHEST 1 VIEW COMPARISON:  03/01/2017 . FINDINGS: Tracheostomy tube and feeding tube in stable position. Cardiomegaly with mild diffuse interstitial prominence suggesting mild CHF. Persistent atelectasis and consolidation left lower lobe. Small left pleural effusion cannot be excluded. No pneumothorax. IMPRESSION: 1. Tracheostomy tube and feeding tube in stable position. 2. Cardiomegaly with diffuse mild interstitial prominence suggesting mild CHF. 3. Persistent left lower lobe atelectasis and consolidation. Small left pleural effusion cannot be excluded . Electronically Signed   By: Marcello Moores  Register   On: 03/03/2017 07:09   Dg Chest Port 1 View  Result Date: 03/01/2017 CLINICAL DATA:  Respiratory failure EXAM: PORTABLE CHEST 1 VIEW COMPARISON:  02/27/2017 FINDINGS: 0546 hours. Patient rotated to the left. Tracheostomy tube remains in place. A feeding tube passes into the stomach although the distal tip position is not included on the film. The cardio pericardial silhouette is enlarged. Left base collapse/consolidation again noted. There is pulmonary vascular congestion without overt pulmonary edema. IMPRESSION: Rotated film with cardiomegaly and vascular congestion. Persistent left base collapse/ consolidation. Electronically Signed   By: Misty Stanley M.D.   On: 03/01/2017 07:43   Dg Chest Port 1  View  Result Date: 02/27/2017 CLINICAL DATA:  Hypoxia. EXAM: PORTABLE CHEST 1 VIEW COMPARISON:  02/26/2017. FINDINGS: Tracheostomy to in stable position. Stable cardiomegaly. Low lung volumes. Progressive left lower lobe atelectasis and infiltrate. Small left pleural effusion cannot be excluded . IMPRESSION: 1. Tracheostomy tube in stable position. 2. Progressive left lower lobe atelectasis and infiltrate. Small left pleural effusion cannot be excluded . Electronically Signed   By: Marcello Moores  Register   On: 02/27/2017 07:01   Dg Chest Port 1 View  Result Date: 02/26/2017 CLINICAL DATA:  Status post tracheostomy placement. EXAM: PORTABLE CHEST 1 VIEW COMPARISON:  Earlier today. FINDINGS: The endotracheal tube has been removed and replaced with a tracheostomy tube in satisfactory position. The nasogastric tube and esophageal probe have been removed. The left jugular catheter has been removed. No pneumothorax. Grossly stable enlarged cardiac silhouette. Left lower lobe opacity with improvement laterally since 02/25/2017. Clear right lung. Unremarkable bones. IMPRESSION: 1. Tracheostomy tube in satisfactory position. 2. Left lower lobe atelectasis or pneumonia with mild improvement since 2 days ago. 3. Stable cardiomegaly. Electronically Signed   By: Claudie Revering  M.D.   On: 02/26/2017 16:02   Dg Chest Port 1 View  Result Date: 02/26/2017 CLINICAL DATA:  Hypoxia.  Shortness of breath. EXAM: PORTABLE CHEST 1 VIEW COMPARISON:  02/25/2017. FINDINGS: Endotracheal tube, NG tube, left IJ line esophageal probe in stable position. Cardiomegaly with bilateral pulmonary interstitial infiltrates consistent with CHF. Left base atelectasis . Small left pleural effusion. Similar findings noted on prior exam. No pneumothorax. IMPRESSION: 1. Lines and tubes in stable position. 2. Cardiomegaly with bilateral pulmonary interstitial prominence and small left pleural effusion noted consistent CHF. Left base atelectasis. Similar  findings noted on prior exam. Electronically Signed   By: [place]   On: 02/26/2017 07:18   Dg Abd Portable 1v  Result Date: 03/26/2017 CLINICAL DATA:  Feeding tube placement EXAM: PORTABLE ABDOMEN - 1 VIEW COMPARISON:  None. FINDINGS: No feeding tube visualized on this image. Nonobstructive bowel gas pattern. IMPRESSION: No visible feeding tube. Electronically Signed   By: Rolm Baptise M.D.   On: 03/26/2017 09:43   Dg Abd Portable 1v  Result Date: 03/02/2017 CLINICAL DATA:  Encounter for feeding tube placement EXAM: PORTABLE ABDOMEN - 1 VIEW COMPARISON:  Portable exam 1712 hours compared to CT abdomen and pelvis of 03/01/2017 FINDINGS: Feeding tube traverses abdomen with tip projecting over distal antrum near pylorus. Cardiac silhouette appears enlarged. Visualized bowel gas pattern normal. Osseous structures unremarkable. IMPRESSION: Tip of feeding tube projects over distal gastric antrum near pylorus. Electronically Signed   By: Lavonia Dana M.D.   On: 03/02/2017 17:21   Dg Abd Portable 1v  Result Date: 02/27/2017 CLINICAL DATA:  Feeding tube placement EXAM: PORTABLE ABDOMEN - 1 VIEW COMPARISON:  None. FINDINGS: Feeding tube with the tip projecting over the antrum of the stomach. There is no bowel dilatation to suggest obstruction. There is no evidence of pneumoperitoneum, portal venous gas or pneumatosis. There are no pathologic calcifications along the expected course of the ureters. The osseous structures are unremarkable. IMPRESSION: Feeding tube with the tip projecting over the antrum of the stomach. Electronically Signed   By: Kathreen Devoid   On: 02/27/2017 11:05   Dg Swallowing Func-speech Pathology  Result Date: 03/21/2017 Objective Swallowing Evaluation: Type of Study: MBS-Modified Barium Swallow Study Patient Details Name: [name] MRN: [id number] Date of Birth: Apr 13, 1981 Today's Date: 03/21/2017 Time: SLP Start Time (ACUTE ONLY): 0905-SLP Stop Time (ACUTE ONLY): 0915 SLP Time  Calculation (min) (ACUTE ONLY): 10 min Past Medical History: Past Medical History: Diagnosis Date . Anemia  . Arthritis   "left hand" (09/15/2013) . Asthma  . CHF (congestive heart failure) ([place])  . Chronic bronchitis ([place])   "just about q yr" (09/15/2013) . Chronic kidney disease   "low kidney function" (09/15/2013) ,  T/Th/Sa . COPD (chronic obstructive pulmonary disease) ([place])  . Coronary artery disease  . Hyperlipidemia  . Hypertension  . Migraine   "get them alot" (09/15/2013) . Myocardial infarction [facility]) 04/2015  NSTEMI . Normal coronary arteries   by cardiac catheterization 09/20/13 . Peripheral vascular disease ([place])  . Pneumonia   "couple times; have it now" (09/15/2013) . PONV (postoperative nausea and vomiting)  . Restless legs  . Shortness of breath   "just recently; related to the pneumonia" (09/15/2013) . Type 1 diabetes mellitus ([place])   type 2 Past Surgical History: Past Surgical History: Procedure Laterality Date . AMPUTATION Right 06/25/2016  Procedure: Amputation Right Great Toe at the Metatarsophalangeal Joint;  Surgeon: Newt Minion, MD;  Location: [place];  Service: Orthopedics;  Laterality: Right; . AMPUTATION Bilateral 10/08/2016  Procedure: Bilateral Transmetatarsal Amputation;  Surgeon: Newt Minion, MD;  Location: [place];  Service: Orthopedics;  Laterality: Bilateral; . AMPUTATION Left 03/13/2017  Procedure: AMPUTATION ABOVE KNEE;  Surgeon: Newt Minion, MD;  Location: [place];  Service: Orthopedics;  Laterality: Left; . AV FISTULA PLACEMENT Left 03/27/2015  Procedure: CREATION RADIAL CEPHALIC ARTERIOVENOUS FISTULA;  Surgeon: Angelia Mould, MD;  Location: [place];  Service: Vascular;  Laterality: Left; . AV FISTULA PLACEMENT Left 11/23/2015  Procedure:  LEFT ARM BASILIC VEIN TRANSPOSITION  ;  Surgeon: Angelia Mould, MD;  Location: [place];  Service: Vascular;  Laterality: Left; . CESAREAN SECTION  1999; 2006 . CORONARY ANGIOGRAM  09/20/2013  Procedure: CORONARY ANGIOGRAM;  Surgeon:  Lorretta Harp, MD;  Location: [facility] CATH LAB;  Service: Cardiovascular;; . FINGER SURGERY Left 1985  3rd and 4th digits reconstructed after cut off" (09/15/2013) . IR FLUORO GUIDE CV LINE RIGHT  03/11/2017 . IR US GUIDE VASC ACCESS RIGHT  03/11/2017 . PERIPHERAL VASCULAR CATHETERIZATION N/A 09/05/2016  Procedure: Abdominal Aortogram;  Surgeon: Elam Dutch, MD;  Location: [place] CV LAB;  Service: Cardiovascular;  Laterality: N/A; . PERIPHERAL VASCULAR CATHETERIZATION Bilateral 09/05/2016  Procedure: Lower Extremity Angiography;  Surgeon: Elam Dutch, MD;  Location: [place] CV LAB;  Service: Cardiovascular;  Laterality: Bilateral; . PERIPHERAL VASCULAR CATHETERIZATION Right 09/05/2016  Procedure: Peripheral Vascular Balloon Angioplasty;  Surgeon: Elam Dutch, MD;  Location: [place] CV LAB;  Service: Cardiovascular;  Laterality: Right;  peroneal and AT . SHOULDER ARTHROSCOPY WITH BICEPSTENOTOMY Right 05/10/2015  Procedure: RIGHT SHOULDER ARTHROSCOPY WITH BICEPS TENOTOMY, DEBRIDEMENT LABRAL TEAR;  Surgeon: Tania Ade, MD;  Location: [place];  Service: Orthopedics;  Laterality: Right;  Right shoulder arthroscopy biceps tenotomy, debridement labral tear . STUMP REVISION Left 01/21/2017  Procedure: . Revision Left Transmetatarsal Amputation;  Surgeon: Newt Minion, MD;  Location: [place];  Service: Orthopedics;  Laterality: Left; . TONSILLECTOMY  1997 . 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 ETT 5/19 and trach'd 5/31. Hospital course included cardiac arrest, transfer to ICU No Data Recorded Assessment / Plan / Recommendation CHL IP CLINICAL IMPRESSIONS 03/21/2017 Clinical Impression  Unfortunately pts arousal was a major barrier to success on this exam. Pt fully alert during bedside eval the preceeding day, and awake but drowsy on arrival to radiology. When she was transferred to the chair and positioned, pt could not appropriately sustain arousal without max cues. SLP was able to provide stimuli and tactile cueing for one bite of puree. Pt orally transited this eventually and triggered a weak, ineffective swallow leaving the majority of the bolus in pharynx. With max cues and painful stimuli SLP was able to cue pt to trigger two more swallows to clear bolus from pharynx. At that time the study was discontinued due to risk to pt and inability to progress further. This is likely not indicative of pts function and there is potential for ability to consume PO, but pt will need to be fully alert and participatory. Will follow for further attempts.  SLP Visit Diagnosis Dysphagia, unspecified (R13.10) Attention and concentration deficit following -- Frontal lobe  and executive function deficit following -- Impact on safety and function Severe aspiration risk   CHL IP TREATMENT RECOMMENDATION 03/21/2017 Treatment Recommendations Therapy as outlined in treatment plan below   No flowsheet data found. CHL IP DIET RECOMMENDATION 03/21/2017 SLP Diet Recommendations NPO;Alternative means - temporary Liquid Administration via -- Medication Administration -- Compensations -- Postural Changes --   No flowsheet data found.  CHL IP FOLLOW UP RECOMMENDATIONS 03/21/2017 Follow up Recommendations Skilled Nursing facility   [facility] IP FREQUENCY AND DURATION 03/21/2017 Speech Therapy Frequency (ACUTE ONLY) min 2x/week Treatment Duration 2 weeks      CHL IP ORAL PHASE 03/21/2017 Oral Phase Impaired Oral - Pudding Teaspoon -- Oral - Pudding Cup -- Oral - Honey Teaspoon -- Oral - Honey Cup -- Oral - Nectar Teaspoon -- Oral - Nectar Cup -- Oral - Nectar Straw -- Oral - Thin Teaspoon -- Oral - Thin Cup -- Oral - Thin Straw -- Oral -  Puree Delayed oral transit;Reduced posterior propulsion Oral - Mech Soft -- Oral - Regular -- Oral - Multi-Consistency -- Oral - Pill -- Oral Phase - Comment --  CHL IP PHARYNGEAL PHASE 03/21/2017 Pharyngeal Phase Impaired Pharyngeal- Pudding Teaspoon -- Pharyngeal -- Pharyngeal- Pudding Cup -- Pharyngeal -- Pharyngeal- Honey Teaspoon -- Pharyngeal -- Pharyngeal- Honey Cup -- Pharyngeal -- Pharyngeal- Nectar Teaspoon -- Pharyngeal -- Pharyngeal- Nectar Cup -- Pharyngeal -- Pharyngeal- Nectar Straw -- Pharyngeal -- Pharyngeal- Thin Teaspoon -- Pharyngeal -- Pharyngeal- Thin Cup -- Pharyngeal -- Pharyngeal- Thin Straw -- Pharyngeal -- Pharyngeal- Puree Reduced laryngeal elevation;Reduced tongue base retraction;Reduced pharyngeal peristalsis;Reduced epiglottic inversion;Delayed swallow initiation-vallecula;Pharyngeal residue - valleculae;Pharyngeal residue - pyriform Pharyngeal -- Pharyngeal- Mechanical Soft -- Pharyngeal -- Pharyngeal- Regular -- Pharyngeal -- Pharyngeal- Multi-consistency -- Pharyngeal -- Pharyngeal- Pill -- Pharyngeal -- Pharyngeal Comment --  No flowsheet data found. No flowsheet data found. [place], Katherene Ponto 03/21/2017, 9:55 AM                LOS: 41 days   Reyne Dumas, MD  [facility] Pager: [phone]  If 7PM-7AM, please contact night-coverage [email or web address] Password [email or web address] 03/27/2017, 1:02 PM

## 2017-03-28 DIAGNOSIS — E1165 Type 2 diabetes mellitus with hyperglycemia: Secondary | ICD-10-CM

## 2017-03-28 DIAGNOSIS — N185 Chronic kidney disease, stage 5: Secondary | ICD-10-CM

## 2017-03-28 DIAGNOSIS — E1122 Type 2 diabetes mellitus with diabetic chronic kidney disease: Secondary | ICD-10-CM

## 2017-03-28 DIAGNOSIS — T17908A Unspecified foreign body in respiratory tract, part unspecified causing other injury, initial encounter: Secondary | ICD-10-CM

## 2017-03-28 LAB — COMPREHENSIVE METABOLIC PANEL
ALT: 15 U/L (ref 14–54)
AST: 23 U/L (ref 15–41)
Albumin: 3 g/dL — ABNORMAL LOW (ref 3.5–5.0)
Alkaline Phosphatase: 290 U/L — ABNORMAL HIGH (ref 38–126)
Anion gap: 18 — ABNORMAL HIGH (ref 5–15)
BUN: 91 mg/dL — AB (ref 6–20)
CHLORIDE: 99 mmol/L — AB (ref 101–111)
CO2: 27 mmol/L (ref 22–32)
CREATININE: 7.38 mg/dL — AB (ref 0.44–1.00)
Calcium: 10.5 mg/dL — ABNORMAL HIGH (ref 8.9–10.3)
GFR calc Af Amer: 7 mL/min — ABNORMAL LOW (ref >60)
GFR calc non Af Amer: 6 mL/min — ABNORMAL LOW (ref >60)
GLUCOSE: 171 mg/dL — AB (ref 65–99)
Potassium: 5 mmol/L (ref 3.5–5.1)
SODIUM: 144 mmol/L (ref 135–145)
Total Bilirubin: 1.4 mg/dL — ABNORMAL HIGH (ref 0.3–1.2)
Total Protein: 8.3 g/dL — ABNORMAL HIGH (ref 6.5–8.1)

## 2017-03-28 LAB — CULTURE, BLOOD (ROUTINE X 2)
CULTURE: NO GROWTH
CULTURE: NO GROWTH
SPECIAL REQUESTS: ADEQUATE
Special Requests: ADEQUATE

## 2017-03-28 LAB — GLUCOSE, CAPILLARY
GLUCOSE-CAPILLARY: 179 mg/dL — AB (ref 65–99)
GLUCOSE-CAPILLARY: 160 mg/dL — AB (ref 65–99)
Glucose-Capillary: 173 mg/dL — ABNORMAL HIGH (ref 65–99)
Glucose-Capillary: 165 mg/dL — ABNORMAL HIGH (ref 65–99)
Glucose-Capillary: 188 mg/dL — ABNORMAL HIGH (ref 65–99)

## 2017-03-28 LAB — CBC
HCT: 31.9 % — ABNORMAL LOW (ref 36.0–46.0)
Hemoglobin: 9.3 g/dL — ABNORMAL LOW (ref 12.0–15.0)
MCH: 28.9 pg (ref 26.0–34.0)
MCHC: 29.2 g/dL — ABNORMAL LOW (ref 30.0–36.0)
MCV: 99.1 fL (ref 78.0–100.0)
PLATELETS: 458 10*3/uL — AB (ref 150–400)
RBC: 3.22 MIL/uL — AB (ref 3.87–5.11)
RDW: 17.6 % — ABNORMAL HIGH (ref 11.5–15.5)
WBC: 36.8 10*3/uL — AB (ref 4.0–10.5)

## 2017-03-28 MED ORDER — CARVEDILOL 6.25 MG PO TABS
6.2500 mg | ORAL_TABLET | Freq: Two times a day (BID) | ORAL | Status: DC
  Administered 2017-03-28: 6.25 mg via ORAL
  Filled 2017-03-28: qty 1

## 2017-03-28 MED ORDER — PIPERACILLIN-TAZOBACTAM 3.375 G IVPB
3.3750 g | Freq: Two times a day (BID) | INTRAVENOUS | Status: AC
  Administered 2017-03-28 – 2017-04-11 (×26): 3.375 g via INTRAVENOUS
  Filled 2017-03-28 (×30): qty 50

## 2017-03-28 MED ORDER — HEPARIN SODIUM (PORCINE) 1000 UNIT/ML DIALYSIS: 2000.0000 [IU] | INTRAMUSCULAR | Status: DC | PRN

## 2017-03-28 MED ORDER — SODIUM CHLORIDE 0.45 % IV SOLN
INTRAVENOUS | Status: DC
  Administered 2017-03-28 – 2017-03-31 (×3): via INTRAVENOUS

## 2017-03-28 MED ORDER — ALTEPLASE 2 MG IJ SOLR: 2.0000 mg | Freq: Once | INTRAMUSCULAR | Status: DC | PRN

## 2017-03-28 MED ORDER — SODIUM CHLORIDE 0.9 % IV SOLN: 100.0000 mL | INTRAVENOUS | Status: DC | PRN

## 2017-03-28 MED ORDER — PENTAFLUOROPROP-TETRAFLUOROETH EX AERO: 1.0000 "application " | INHALATION_SPRAY | CUTANEOUS | Status: DC | PRN

## 2017-03-28 MED ORDER — LIDOCAINE HCL (PF) 1 % IJ SOLN: 5.0000 mL | INTRAMUSCULAR | Status: DC | PRN

## 2017-03-28 MED ORDER — CALCIUM CARBONATE ANTACID 1250 MG/5ML PO SUSP
250.0000 mg | Freq: Three times a day (TID) | ORAL | Status: DC
  Administered 2017-03-28 – 2017-04-12 (×34): 250 mg via ORAL
  Filled 2017-03-28 (×33): qty 5

## 2017-03-28 MED ORDER — CARVEDILOL 3.125 MG PO TABS: 3.1250 mg | ORAL_TABLET | Freq: Two times a day (BID) | ORAL | Status: DC

## 2017-03-28 MED ORDER — HEPARIN SODIUM (PORCINE) 1000 UNIT/ML DIALYSIS: 1000.0000 [IU] | INTRAMUSCULAR | Status: DC | PRN

## 2017-03-28 MED ORDER — LIDOCAINE-PRILOCAINE 2.5-2.5 % EX CREA: 1.0000 "application " | TOPICAL_CREAM | CUTANEOUS | Status: DC | PRN

## 2017-03-28 NOTE — Progress Notes (Signed)
Pharmacy Antibiotic Note  [name] is a 36 y.o. female admitted on 02/14/2017 with LLE TMA stump wound/gangrene and has a prolonged and complicated hospital stay. The current issue is concern for HCAP and aspiration PNA. Pharmacy has been consulted to transition from Ceftazidime to Zosyn for better anaerobic coverage.   Plan: 1. Start Zosyn 3.375g IV every 12 hours (infused over 4 hours) 2. Will continue to follow HD schedule/duration, culture results, LOT, and antibiotic de-escalation plans   Height: 5' 8.5" (174 cm) Weight: 169 lb 12.1 oz (77 kg) IBW/kg (Calculated) : 65.05  Temp (24hrs), Avg:98.6 F (37 C), Min:97.8 F (36.6 C), Max:99.9 F (37.7 C)   Recent Labs Lab 03/22/17 0501 03/23/17 0303 03/24/17 0800 03/26/17 0352 03/28/17 0308  WBC 23.8* 20.5* 18.8* 23.7* 36.8*  CREATININE 5.81* 7.30* 4.26* 3.28* 7.38*    Estimated Creatinine Clearance: 10.7 mL/min (A) (by C-G formula based on SCr of 7.38 mg/dL (H)).    Allergies  Allergen Reactions  . Aspirin Anaphylaxis  . Sulfur Hives  . Tramadol Hives and Other (See Comments)    Pt states she feels weird   . Gabapentin Other (See Comments)    Lethargic     Antimicrobials this admission: Vanc 5/19>> 5/25;  5/30 > 6/8; restart 6/14 >> 6/20 Zosyn 5/19>> 5/22; restart 6/30 >> Clindamycin 5/19>> 5/21 Meropenem 5/22 >> 5/30 Ceftriax 5/30> 6/8 Metronid 5/30> 6/8 Ceftazidime 6/14 >> 6/20;6/29>> 6/30  Dose adjustments this admission: 5/25 pre-HD VR: 23 (on 1 g w/ HD)  Microbiology results: 5/19 BCx: ngf 5/19: MRSA PCR: neg 6/1 BCx: ngf 6/1 CDiff: neg 6/13 BCx: ngf 6/28 blood x 2 >> ngtd  Thank you for allowing pharmacy to be a part of this patient's care.  Georgina Pillion, PharmD, BCPS Clinical Pharmacist Clinical phone for 03/28/2017 from 7a-3:30p: [phone] If after 3:30p, please call main pharmacy at: [phone] 03/28/2017 10:47 AM

## 2017-03-28 NOTE — Progress Notes (Addendum)
Subjective:  No new c/o's.  Prob new R basilar PNA by  CXR yest /w new fevers xy 2 days and rising WBC.  Also diarrhea w/ rectal tube in place.  BP's low and tachycardic.   Objective Vital signs in last 24 hours: Vitals:   03/28/17 1100 03/28/17 1115 03/28/17 1123 03/28/17 1126  BP: (!) 93/55 (!) 72/39 (!) 78/40 (!) 73/38  Pulse: (!) 120 (!) 128 (!) 129 (!) 128  Resp:      Temp:      TempSrc:      SpO2:      Weight:      Height:       Weight change: -1 kg (-2 lb 3.3 oz)  Intake/Output Summary (Last 24 hours) at 03/28/17 1153 Last data filed at 03/27/17 1501  Gross per 24 hour  Intake               60 ml  Output                0 ml  Net               60 ml   Physical Exam: General: tracking some- following some commands- on just O2 via trach Heart: tachy Lungs: coarse BS bilat ant, no rales or wheezing Abdomen: obese, soft Extremities: R heel w/ new posterior ulcer, dry, no odor; L AKA with wound vac Dialysis Access: avf on left - good thrill and bruit   Dialysis:  [facility] TTS 4h 70mn   F200   114kg   2/2 bath  LUA AVF Iron Sucrose (Venofer) 50 mg IVP During Dialysis 1X Week  Mircera 75 mcg IV q  2 weeks ( last on 02/05/17) Vitamin D (Calcitriol) Oral 0.75 mcg  Po q hd    Summary: Pt is a 36y.o. yo female who was admitted on 02/14/2017 with arrest- now with anoxic brain injury, trach and s/p L AKA this hosp   Assess: 1. Anoxic brain injury- has made some progress. Eating solids now, NG out.  Poor long-term HD candidate in this condition 2. Fever/ PNA /^WBC - on IV abx now 3. Hypotension/ tachy/ vol depletion - combination vol depletion and infection (#2). BP's too low for Coreg, have dc'd Coreg. Starting IVF's.  4. ESRD - on TTS HD, started HD June '17.  5. Anemia- hgb 9.5 which is up, on max aranesp 6. MBD of CKD - Ca and Phos in range, no binders. PTH 88 here.   7. HTN - soft BP's and tachy, no BP meds needed 8. PVD- s/p L AKA- wound vac 9. DM2 - per primary  Plan  - 1 L NS bolus with HD today. Needs more fluid in.  Start 1/2 NS IVF's at 65/hr.  RKelly SplinterMD CNewell Rubbermaidpgr ([phone]  03/28/2017, 11:53 AM       Labs: Basic Metabolic Panel:  Recent Labs Lab 03/21/17 1430  03/24/17 0800 03/26/17 0352 03/28/17 0308  NA 143  < > 140 138 144  K 4.2  < > 4.1 4.3 5.0  CL 99*  < > 94* 95* 99*  CO2 26  < > 27 27 27   GLUCOSE 206*  < > 157* 182* 171*  BUN 73*  < > 71* 46* 91*  CREATININE 4.99*  < > 4.26* 3.28* 7.38*  CALCIUM 9.4  < > 9.9 9.6 10.5*  PHOS 5.6*  --   --   --   --   < > =  values in this interval not displayed. Liver Function Tests:  Recent Labs Lab 03/24/17 0800 03/26/17 0352 03/28/17 0308  AST 34 33 23  ALT 17 20 15   ALKPHOS 350* 355* 290*  BILITOT 1.5* 1.5* 1.4*  PROT 8.5* 8.3* 8.3*  ALBUMIN 3.9 3.4* 3.0*   No results for input(s): LIPASE, AMYLASE in the last 168 hours. No results for input(s): AMMONIA in the last 168 hours. CBC:  Recent Labs Lab 03/22/17 0501 03/23/17 0303 03/24/17 0800 03/26/17 0352 03/28/17 0308  WBC 23.8* 20.5* 18.8* 23.7* 36.8*  HGB 8.7* 8.3* 9.5* 9.4* 9.3*  HCT 30.8* 28.8* 33.4* 33.0* 31.9*  MCV 100.0 99.7 99.7 100.6* 99.1  PLT 483* 486* 441* 453* 458*   Cardiac Enzymes:  Recent Labs Lab 03/21/17 1855  TROPONINI 0.04*   CBG:  Recent Labs Lab 03/27/17 1624 03/27/17 1945 03/27/17 2314 03/28/17 0419 03/28/17 0816  GLUCAP 252* 232* 207* 173* 165*    Iron Studies: No results for input(s): IRON, TIBC, TRANSFERRIN, FERRITIN in the last 72 hours. Studies/Results: Dg Chest Port 1 View  Result Date: 03/27/2017 CLINICAL DATA:  Tachypnea EXAM: PORTABLE CHEST 1 VIEW COMPARISON:  03/26/2017 FINDINGS: Tracheostomy tube is again noted in satisfactory position. Cardiomegaly is again noted. Mild increasing vascular congestion is noted with some interstitial edema. Increasing density in the right lung base is noted likely representing early infiltrate. This is new  from the prior exam. No bony abnormality is noted. IMPRESSION: Stable interstitial edema with new right basilar infiltrate identified. Electronically Signed   By: Inez Catalina M.D.   On: 03/27/2017 07:14   Medications: Infusions: . sodium chloride    . sodium chloride    . piperacillin-tazobactam (ZOSYN)  IV      Scheduled Medications: . calcium carbonate (dosed in mg elemental calcium)  250 mg of elemental calcium Oral TID WC  . [START ON 03/29/2017] carvedilol  3.125 mg Oral BID WC  . chlorhexidine gluconate (MEDLINE KIT)  15 mL Mouth Rinse BID  . [START ON 04/02/2017] darbepoetin (ARANESP) injection - DIALYSIS  200 mcg Intravenous Q Thu-HD  . dextrose  1 ampule Intravenous Once  . [name]'s butt cream   Topical QID  . heparin  5,000 Units Subcutaneous Q8H  . insulin aspart  0-15 Units Subcutaneous Q4H  . insulin glargine  8 Units Subcutaneous Daily  . mouth rinse  15 mL Mouth Rinse QID  . multivitamin  1 tablet Oral QHS  . pantoprazole sodium  40 mg Per Tube Q24H  . saccharomyces boulardii  250 mg Oral BID    have reviewed scheduled and prn medications.

## 2017-03-28 NOTE — Progress Notes (Signed)
Patient arrived to unit per bed.  Reviewed treatment plan and this RN agrees.  Report received from bedside RN, Tobi Bastos.  Consent verified.  Patient Alert, not speaking. Lung sounds rhoncus to ausculation in all fields. No edema. Cardiac: ST.  Prepped LUAVF with alcohol and cannulated with two 15 gauge needles.  Pulsation of blood noted.  Flushed access well with saline per protocol.  Connected and secured lines and initiated tx at 1057.  UF goal of 500 mL and net fluid removal of 0 mL.  Will continue to monitor.

## 2017-03-28 NOTE — Progress Notes (Signed)
[facility] PROGRESS NOTE  Samira Acero HXT:[id number] DOB: [birth year] DOA: 02/14/2017  PCP: Vicenta Aly, FNP  Brief History/Interval Summary: Patient is a 36 y.o. female with history of ESRD on HD, poorly controlled diabetes, history of bilateral transmetatarsal foot amputation with subsequent left foot wound dehiscence (refused BKA as outpatient), chronic systolic heart failure felt to be secondary to nonischemic cardiomyopathy admitted on 02/14/17 with wound dehiscence and persistent drainage from the left trans-metatarsal amputation site. She was [facility] to [facility]. She was subsequently found to be unresponsive in her room on 5/19 after getting IV Dilaudid-she was  in asystole, CPR was started, with ROSC in around 9 minutes.She was then transferred to the intensive care unit, and subsequently underwent cooling and then rewarming. She was unable to be liberated off the ventilator for several weeks , and as a result underwent tracheostomy on 5/31. Patient has been evaluated by neurology during this hospital stay, per neurology chances of meaningful neurological recovery was dismal. Hospital course has now been complicated by persistent leukocytosis, respiratory failure, and wound dehiscence with gangrene causing persistent SIRS pathophysiology and now suspected aspiration PNA. 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 Since then the patient has been weaned off the vent and maintained on trach collar.   Consultants:   Infectious disease:  Neurology  Gastroenterology  Wound care  PCCM  Nephrology  Orthopedics  Procedures:   Transthoracic echocardiogram 5/20 Study Conclusions  - Left ventricle: The cavity size was normal. Wall thickness was   increased in a pattern of mild LVH. Systolic function was   moderately to severely reduced. The estimated ejection fraction   was in the range of 30% to 35%. Diffuse hypokinesis. There is   akinesis of the  basal-midinferior myocardium. Features are   consistent with a pseudonormal left ventricular filling pattern,   with concomitant abnormal relaxation and increased filling   pressure (grade 2 diastolic dysfunction). - Mitral valve: There was moderate regurgitation. Valve area by   pressure half-time: 2.44 cm^2. - Left atrium: The atrium was mildly dilated. - Right ventricle: The cavity size was mildly dilated. Wall   thickness was normal. - Tricuspid valve: There was mild regurgitation. - Pulmonary arteries: Systolic pressure was mildly increased. PA   peak pressure: 43 mm Hg (S). - Pericardium, extracardiac: A moderate pericardial effusion was   identified circumferential to the heart. . Largest pocket of   fluid is posterior to right atrium (2cm). No evidence of   tamponade physiology.  Transthoracic echocardiogram 6/28 Study Conclusions  - Left ventricle: The cavity size was normal. There was mild   concentric hypertrophy. Systolic function was severely reduced.   The estimated ejection fraction was in the range of 25% to 30%.   Diffuse hypokinesis. Regional wall motion abnormalities cannot be   excluded. - Mitral valve: There was mild regurgitation. - Pericardium, extracardiac: A trivial pericardial effusion was   identified circumferential to the heart.  Antibiotics: She has previously completed multiple courses of antibiotics. Currently on ceftazidime. Will be changed over to Zosyn to cover anaerobic organisms.  Subjective/Interval History: Patient is nonverbal for the most part. She does not appear to be in any distress. Patient's mother is at the bedside.    Objective:  Vital Signs  Vitals:   03/28/17 0500 03/28/17 0813 03/28/17 0900 03/28/17 1000  BP:  (!) 95/58 93/70 (!) 69/52  Pulse:  (!) 131 (!) 133 (!) 124  Resp:  (!) 33 (!) 26 (!) 42  Temp:  98.2 F (36.8 C)    TempSrc:  Axillary    SpO2:  100% 100% 99%  Weight: 77 kg (169 lb 12.1 oz)     Height:          Intake/Output Summary (Last 24 hours) at 03/28/17 1022 Last data filed at 03/27/17 1501  Gross per 24 hour  Intake               60 ml  Output                0 ml  Net               60 ml   Filed Weights   03/26/17 0500 03/27/17 0550 03/28/17 0500  Weight: 78 kg (171 lb 15.3 oz) 78 kg (171 lb 15.3 oz) 77 kg (169 lb 12.1 oz)   Telemetry: Shows sinus tachycardia   General appearance: alert, appears stated age, distracted and no distress Resp: Coarse breath sounds heard bilaterally. Crackles heard bilateral bases. No wheezing. No rhonchi. Cardio: regular rate and rhythm, S1, S2 normal, no murmur, click, rub or gallop GI: soft, non-tender; bowel sounds normal; no masses,  no organomegaly Extremities: Left AKA. Black area noted in the right heel. No erythema. No drainage. Neurologic: She is awake and alert. However, not very communicative. Moving her extremities.  Lab Results:  Data Reviewed: I have personally reviewed following labs and imaging studies  CBC:  Recent Labs Lab 03/22/17 0501 03/23/17 0303 03/24/17 0800 03/26/17 0352 03/28/17 0308  WBC 23.8* 20.5* 18.8* 23.7* 36.8*  HGB 8.7* 8.3* 9.5* 9.4* 9.3*  HCT 30.8* 28.8* 33.4* 33.0* 31.9*  MCV 100.0 99.7 99.7 100.6* 99.1  PLT 483* 486* 441* 453* 458*    Basic Metabolic Panel:  Recent Labs Lab 03/21/17 1430 03/22/17 0501 03/23/17 0303 03/24/17 0800 03/26/17 0352 03/28/17 0308  NA 143 144 144 140 138 144  K 4.2 4.5 4.3 4.1 4.3 5.0  CL 99* 101 100* 94* 95* 99*  CO2 _0 GLUCOSE 206* 240* 196* 157* 182* 171*  BUN 73* 89* 137* 71* 46* 91*  CREATININE 4.99* 5.81* 7.30* 4.26* 3.28* 7.38*  CALCIUM 9.4 9.4 9.4 9.9 9.6 10.5*  PHOS 5.6*  --   --   --   --   --     GFR: Estimated Creatinine Clearance: 10.7 mL/min (A) (by C-G formula based on SCr of 7.38 mg/dL (H)).  Liver Function Tests:  Recent Labs Lab 03/22/17 0501 03/23/17 0303 03/24/17 0800 03/26/17 0352 03/28/17 0308  AST 26 27 34  33 23  ALT 13* 13* _1 ALKPHOS 326* 298* 350* 355* 290*  BILITOT 1.5* 1.4* 1.5* 1.5* 1.4*  PROT 7.4 7.2 8.5* 8.3* 8.3*  ALBUMIN 2.8* 2.7* 3.9 3.4* 3.0*    Cardiac Enzymes:  Recent Labs Lab 03/21/17 1855  TROPONINI 0.04*    CBG:  Recent Labs Lab 03/27/17 1624 03/27/17 1945 03/27/17 2314 03/28/17 0419 03/28/17 0816  GLUCAP 252* 232* 207* 173* 165*     Recent Results (from the past 240 hour(s))  C difficile quick scan w PCR reflex     Status: None   Collection Time: 03/19/17  9:00 AM  Result Value Ref Range Status   C Diff antigen NEGATIVE NEGATIVE Final   C Diff toxin NEGATIVE NEGATIVE Final   C Diff interpretation No C. difficile detected.  Final  Culture, blood (Routine X 2) w Reflex to ID Panel  Status: None (Preliminary result)   Collection Time: 03/23/17  2:06 PM  Result Value Ref Range Status   Specimen Description BLOOD RIGHT WRIST  Final   Special Requests   Final    BOTTLES DRAWN AEROBIC AND ANAEROBIC Blood Culture adequate volume   Culture NO GROWTH 4 DAYS  Final   Report Status PENDING  Incomplete  Culture, blood (Routine X 2) w Reflex to ID Panel     Status: None (Preliminary result)   Collection Time: 03/23/17  2:06 PM  Result Value Ref Range Status   Specimen Description BLOOD RIGHT ARM  Final   Special Requests   Final    BOTTLES DRAWN AEROBIC ONLY Blood Culture adequate volume   Culture NO GROWTH 4 DAYS  Final   Report Status PENDING  Incomplete  Culture, blood (routine x 2)     Status: None (Preliminary result)   Collection Time: 03/26/17  2:31 PM  Result Value Ref Range Status   Specimen Description BLOOD LEFT ARM  Final   Special Requests IN PEDIATRIC BOTTLE Blood Culture adequate volume  Final   Culture NO GROWTH 1 DAY  Final   Report Status PENDING  Incomplete  Culture, blood (routine x 2)     Status: None (Preliminary result)   Collection Time: 03/26/17  2:31 PM  Result Value Ref Range Status   Specimen Description BLOOD RIGHT  HAND  Final   Special Requests IN PEDIATRIC BOTTLE Blood Culture adequate volume  Final   Culture NO GROWTH 1 DAY  Final   Report Status PENDING  Incomplete  MRSA PCR Screening     Status: None   Collection Time: 03/27/17  8:09 AM  Result Value Ref Range Status   MRSA by PCR NEGATIVE NEGATIVE Final    Comment:        The GeneXpert MRSA Assay (FDA approved for NASAL specimens only), is one component of a comprehensive MRSA colonization surveillance program. It is not intended to diagnose MRSA infection nor to guide or monitor treatment for MRSA infections.       Radiology Studies: Dg Chest Port 1 View  Result Date: 03/27/2017 CLINICAL DATA:  Tachypnea EXAM: PORTABLE CHEST 1 VIEW COMPARISON:  03/26/2017 FINDINGS: Tracheostomy tube is again noted in satisfactory position. Cardiomegaly is again noted. Mild increasing vascular congestion is noted with some interstitial edema. Increasing density in the right lung base is noted likely representing early infiltrate. This is new from the prior exam. No bony abnormality is noted. IMPRESSION: Stable interstitial edema with new right basilar infiltrate identified. Electronically Signed   By: Inez Catalina M.D.   On: 03/27/2017 07:14     Medications:  Scheduled: . calcium carbonate (dosed in mg elemental calcium)  250 mg of elemental calcium Oral TID  . [START ON 03/29/2017] carvedilol  3.125 mg Oral BID WC  . chlorhexidine gluconate (MEDLINE KIT)  15 mL Mouth Rinse BID  . [START ON 04/02/2017] darbepoetin (ARANESP) injection - DIALYSIS  200 mcg Intravenous Q Thu-HD  . dextrose  1 ampule Intravenous Once  . [name]'s butt cream   Topical QID  . heparin  5,000 Units Subcutaneous Q8H  . insulin aspart  0-15 Units Subcutaneous Q4H  . insulin glargine  8 Units Subcutaneous Daily  . mouth rinse  15 mL Mouth Rinse QID  . multivitamin  1 tablet Oral QHS  . pantoprazole sodium  40 mg Per Tube Q24H  . 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)   Diabetic osteomyelitis ([place])   Dehiscence of amputation stump (HCC)   Osteomyelitis (HCC)   Chronic pain syndrome   Cardiac arrest, cause unspecified ([place])   Pressure injury of skin   Wound dehiscence   Coma ([place])   Elevated liver enzymes   Jaundice   Encounter for central line placement   Protein calorie malnutrition ([place])   DNR (do not resuscitate) discussion   Palliative care by specialist   Tachycardia   Goals of care, counseling/discussion   Anoxic brain injury [facility])   Palliative care encounter   Tracheostomy status ([place])   Abscess of left leg   Hypoxemia   Fever   Aspiration into airway    Acute /chronic hypoxemic respiratory failure in a setting of cardiac arrest Last ventilator use was on night of 6/19-6/20. On TC for several days. PCCM following for resp issues -tracheostomy in place. Change to #4 cuffless trach. S/P MBS 6/23. Now status post FEES 6/27. Patient started on nectar thick liquids and dysphagia 1 diet 6/27. Holding off on PEG for now. Cortrak removed 6/28 . Unfortunately patient seems to have aspirated. She was febrile and tachypneic. Repeat chest x-ray on 6/29 showed new right-sided infiltrate. She was started back on [place] on 6/29. Will change to Zosyn to provide anaerobic coverage.  Tachycardia Sinus tachycardia, occasional SVT, likely secondary to  low-grade fever, low blood pressure, not sure if the patient will be a candidate for long-term hemodialysis. Repeat 2-D echo shows EF 25-30%, small pericardial effusion,previously EF was 30-35%. She is noted to be on carvedilol. Blood pressure is noted to be borderline low. Dose was reduced. Blood pressure did drop after she was given carvedilol. Dose will be further reduced. This will be challenging as the patient is on hemodialysis.  Cardiac arrest on 5/19 with anoxic brain injury Patient has shown some cognitive improvement over the last few days. She has been noted to follow certain commands, although not following them today. Her prognosis remains guarded. She has been seen by neurology.    Systemic inflammatory response syndrome/Persistent leukocytosis/aspiration pneumonia Tachycardic-and has persistent leukocytosis. WBC noted to be significantly higher today. Initially thought to be secondary to left foot gangrene/infection and abscess-she is now status post AKA and still continues to have high WBC. Blood cultures on 6/1, 6/13 were negative. Repeat blood cultures 6/25 without any growth as well. Chest x-ray from 6/29 to show new right-sided infiltrate which could be the reason for her increase count. Antibiotics have been restarted. As discussed above. She has been tested for C. difficile as well, which has been negative. She underwent CT scan of her chest on 6/24 which was negative for PE. She has completed multiple different courses of antibiotics with the last course of vancomycin and Fortaz completed on 6/20. Central line was discontinued 6/26. Started on Fortaz 6/29 and changed to Zosyn. 6/30.   Diarrhea, continues to have loose stools Continue probiotics and Imodium as needed.  Left  trans-metatarsal stump wound dehiscence with infection and gangrene Per operative note-pus/abscess noted extending up to the knee when BKA was attempted, subsequently underwent a AKA. Note, initially family  was very reluctant to proceed with amputation, but given persistent leukocytosis/fever and potential risk of sepsis, family agreed for amputation, subsequently AKA was done on 6/15. Orthopedics removed wound VAC 6/20. Requested Dr Sharol Given to revaluate left leg stump 6/28. Patient also appears to have developed a pressure wound in the right heel. No drainage, no foul smell. No erythema noted. Consult wound care nurse.  ESRD Nephrology following-HD on TTS. Low blood pressure. Will create challenges for hemodialysis.  Normocytic anemia Likely secondary to chronic disease-probably worsened by acute illness. No signs of bleeding, hemoglobin has been stable.  Diabetes mellitus type 2  Continue with SSI. On Lantus. No recent HbA1c. The last one was from January and was 5.2.  Chronic systolic heart failure/nonischemic cardiomyopathy (EF 30-35% by TEE on 6/4) Volume is being managed with hemodialysis. She is on Coreg. However, her blood pressure tends to drop when she gets this medication. We will cut back on the dose.   Pericardial effusion Seen on TEE-with no evidence of tamponade pathophysiology. Repeat echo 6/26 with no significant pericardial effusion.  Moderate protein calorie malnutrition Started PO's 6/27   Goals of care Prognosis is extremely guarded. Palliative medicine is following and assisting with management.   DVT Prophylaxis: Subcutaneous heparin    Code Status: Full code  Family Communication: Discussed with the mother  Disposition Plan: Management as outlined above.    LOS: 42 days   [facility] Pager [phone] 03/28/2017, 10:22 AM  If 7PM-7AM, please contact night-coverage at [email or web address], password [facility]

## 2017-03-28 NOTE — Progress Notes (Signed)
Dialysis treatment terminated early per Nephrology d/t persistent hypotension despite 1L NS bolus.  0 mL ultrafiltrated and net fluid removal -1500 mL.    Patient status unchanged. Lung sounds diminished to ausculation in all fields. No edema. Cardiac: ST.  Disconnected lines and removed needles.  Pressure held for 10 minutes and band aid/gauze dressing applied.  Report given to bedside RN, Tobi Bastos.

## 2017-03-29 ENCOUNTER — Inpatient Hospital Stay (HOSPITAL_COMMUNITY): Payer: Medicaid Other

## 2017-03-29 DIAGNOSIS — D649 Anemia, unspecified: Secondary | ICD-10-CM

## 2017-03-29 LAB — CBC
HCT: 30.4 % — ABNORMAL LOW (ref 36.0–46.0)
HEMOGLOBIN: 8.7 g/dL — AB (ref 12.0–15.0)
MCH: 28.2 pg (ref 26.0–34.0)
MCHC: 28.6 g/dL — ABNORMAL LOW (ref 30.0–36.0)
MCV: 98.7 fL (ref 78.0–100.0)
Platelets: 387 10*3/uL (ref 150–400)
RBC: 3.08 MIL/uL — AB (ref 3.87–5.11)
RDW: 17.5 % — ABNORMAL HIGH (ref 11.5–15.5)
WBC: 21.5 10*3/uL — AB (ref 4.0–10.5)

## 2017-03-29 LAB — GLUCOSE, CAPILLARY
GLUCOSE-CAPILLARY: 113 mg/dL — AB (ref 65–99)
GLUCOSE-CAPILLARY: 132 mg/dL — AB (ref 65–99)
GLUCOSE-CAPILLARY: 141 mg/dL — AB (ref 65–99)
Glucose-Capillary: 110 mg/dL — ABNORMAL HIGH (ref 65–99)
Glucose-Capillary: 118 mg/dL — ABNORMAL HIGH (ref 65–99)
Glucose-Capillary: 147 mg/dL — ABNORMAL HIGH (ref 65–99)
Glucose-Capillary: 203 mg/dL — ABNORMAL HIGH (ref 65–99)

## 2017-03-29 LAB — BASIC METABOLIC PANEL
ANION GAP: 18 — AB (ref 5–15)
BUN: 60 mg/dL — ABNORMAL HIGH (ref 6–20)
CHLORIDE: 94 mmol/L — AB (ref 101–111)
CO2: 26 mmol/L (ref 22–32)
Calcium: 9.3 mg/dL (ref 8.9–10.3)
Creatinine, Ser: 5.29 mg/dL — ABNORMAL HIGH (ref 0.44–1.00)
GFR calc Af Amer: 11 mL/min — ABNORMAL LOW (ref >60)
GFR calc non Af Amer: 9 mL/min — ABNORMAL LOW (ref >60)
Glucose, Bld: 112 mg/dL — ABNORMAL HIGH (ref 65–99)
POTASSIUM: 4.2 mmol/L (ref 3.5–5.1)
SODIUM: 138 mmol/L (ref 135–145)

## 2017-03-29 MED ORDER — RESOURCE THICKENUP CLEAR PO POWD
ORAL | Status: DC | PRN
  Filled 2017-03-29 (×2): qty 125

## 2017-03-29 NOTE — Progress Notes (Signed)
 [facility] PROGRESS NOTE  [name] MRN:[id number] DOB: 02/11/1981 DOA: 02/14/2017  PCP: Anderson, Teresa, FNP  Brief History/Interval Summary: Patient is a 35 y.o. female with history of ESRD on HD, poorly controlled diabetes, history of bilateral transmetatarsal foot amputation with subsequent left foot wound dehiscence (refused BKA as outpatient), chronic systolic heart failure felt to be secondary to nonischemic cardiomyopathy admitted on 02/14/17 with wound dehiscence and persistent drainage from the left trans-metatarsal amputation site. She was [facility] to [facility]. She was subsequently found to be unresponsive in her room on 5/19 after getting IV Dilaudid-she was  in asystole, CPR was started, with ROSC in around 9 minutes.She was then transferred to the intensive care unit, and subsequently underwent cooling and then rewarming. She was unable to be liberated off the ventilator for several weeks , and as a result underwent tracheostomy on 5/31. Patient has been evaluated by neurology during this hospital stay, per neurology chances of meaningful neurological recovery was dismal. Hospital course has now been complicated by persistent leukocytosis, respiratory failure, and wound dehiscence with gangrene causing persistent SIRS pathophysiology and now suspected aspiration PNA. 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 Since then the patient has been weaned off the vent and maintained on trach collar.   Consultants:   Infectious disease:  Neurology  Gastroenterology  Wound care  PCCM  Nephrology  Orthopedics  Procedures:   Transthoracic echocardiogram 5/20 Study Conclusions  - Left ventricle: The cavity size was normal. Wall thickness was   increased in a pattern of mild LVH. Systolic function was   moderately to severely reduced. The estimated ejection fraction   was in the range of 30% to 35%. Diffuse hypokinesis. There is   akinesis of the  basal-midinferior myocardium. Features are   consistent with a pseudonormal left ventricular filling pattern,   with concomitant abnormal relaxation and increased filling   pressure (grade 2 diastolic dysfunction). - Mitral valve: There was moderate regurgitation. Valve area by   pressure half-time: 2.44 cm^2. - Left atrium: The atrium was mildly dilated. - Right ventricle: The cavity size was mildly dilated. Wall   thickness was normal. - Tricuspid valve: There was mild regurgitation. - Pulmonary arteries: Systolic pressure was mildly increased. PA   peak pressure: 43 mm Hg (S). - Pericardium, extracardiac: A moderate pericardial effusion was   identified circumferential to the heart. . Largest pocket of   fluid is posterior to right atrium (2cm). No evidence of   tamponade physiology.  Transthoracic echocardiogram 6/28 Study Conclusions  - Left ventricle: The cavity size was normal. There was mild   concentric hypertrophy. Systolic function was severely reduced.   The estimated ejection fraction was in the range of 25% to 30%.   Diffuse hypokinesis. Regional wall motion abnormalities cannot be   excluded. - Mitral valve: There was mild regurgitation. - Pericardium, extracardiac: A trivial pericardial effusion was   identified circumferential to the heart.  Antibiotics: She has previously completed multiple courses of antibiotics. Was started back on ceftazidime 6/29. Changed over to Zosyn to cover anaerobic organisms on 6/30.  Subjective/Interval History: Patient is nonverbal for the most part. Does not appear to be in any distress. Her mother is at the bedside.   Objective:  Vital Signs  Vitals:   03/28/17 2341 03/29/17 0327 03/29/17 0453 03/29/17 0557  BP: 101/70  108/76   Pulse: (!) 108 99 (!) 101   Resp: (!) 25 (!) 23 (!) 28   Temp: 97.7 F (36.5   C)  97.5 F (36.4 C)   TempSrc: Oral  Oral   SpO2: 100% 100% 98%   Weight:    80.4 kg (177 lb 4 oz)  Height:          Intake/Output Summary (Last 24 hours) at 03/29/17 0736 Last data filed at 03/29/17 0500  Gross per 24 hour  Intake           867.08 ml  Output            -1400 ml  Net          2267.08 ml   Filed Weights   03/28/17 1040 03/28/17 1250 03/29/17 0557  Weight: 77 kg (169 lb 12.1 oz) 78.5 kg (173 lb 1 oz) 80.4 kg (177 lb 4 oz)   Telemetry: Heart rate has improved. Still sinus tachycardia   General appearance: Awake, alert. Distracted. No distress. Resp: Coarse breath sounds bilaterally. Crackles at the bases. No wheezing or rhonchi. Perhaps somewhat improved air entry compared to yesterday. Cardio: His minister is tachycardic, regular. No S3, S4. No rubs, murmurs or bruit GI: Abdomen soft. Nontender, nondistended. Bowel sounds are present. Extremities: Left AKA. Dressing was removed from the left stump. There is one area with exposed bone. No erythema. There is some serous drainage. No bleeding noted. Black area noted in the right heel. No erythema. No drainage. Neurologic: She is awake and alert. However, not very communicative. Moving her extremities.  Lab Results:  Data Reviewed: I have personally reviewed following labs and imaging studies  CBC:  Recent Labs Lab 03/23/17 0303 03/24/17 0800 03/26/17 0352 03/28/17 0308 03/29/17 0421  WBC 20.5* 18.8* 23.7* 36.8* 21.5*  HGB 8.3* 9.5* 9.4* 9.3* 8.7*  HCT 28.8* 33.4* 33.0* 31.9* 30.4*  MCV 99.7 99.7 100.6* 99.1 98.7  PLT 486* 441* 453* 458* 932    Basic Metabolic Panel:  Recent Labs Lab 03/23/17 0303 03/24/17 0800 03/26/17 0352 03/28/17 0308 03/29/17 0421  NA 144 140 138 144 138  K 4.3 4.1 4.3 5.0 4.2  CL 100* 94* 95* 99* 94*  CO2 _0 GLUCOSE 196* 157* 182* 171* 112*  BUN 137* 71* 46* 91* 60*  CREATININE 7.30* 4.26* 3.28* 7.38* 5.29*  CALCIUM 9.4 9.9 9.6 10.5* 9.3    GFR: Estimated Creatinine Clearance: 16.4 mL/min (A) (by C-G formula based on SCr of 5.29 mg/dL (H)).  Liver Function  Tests:  Recent Labs Lab 03/23/17 0303 03/24/17 0800 03/26/17 0352 03/28/17 0308  AST 27 34 33 23  ALT 13* _1 ALKPHOS 298* 350* 355* 290*  BILITOT 1.4* 1.5* 1.5* 1.4*  PROT 7.2 8.5* 8.3* 8.3*  ALBUMIN 2.7* 3.9 3.4* 3.0*     CBG:  Recent Labs Lab 03/28/17 1417 03/28/17 1747 03/28/17 2007 03/28/17 2344 03/29/17 0439  GLUCAP 179* 160* 188* 132* 118*     Recent Results (from the past 240 hour(s))  C difficile quick scan w PCR reflex     Status: None   Collection Time: 03/19/17  9:00 AM  Result Value Ref Range Status   C Diff antigen NEGATIVE NEGATIVE Final   C Diff toxin NEGATIVE NEGATIVE Final   C Diff interpretation No C. difficile detected.  Final  Culture, blood (Routine X 2) w Reflex to ID Panel     Status: None   Collection Time: 03/23/17  2:06 PM  Result Value Ref Range Status   Specimen Description BLOOD RIGHT WRIST  Final   Special Requests  Final    BOTTLES DRAWN AEROBIC AND ANAEROBIC Blood Culture adequate volume   Culture NO GROWTH 5 DAYS  Final   Report Status 03/28/2017 FINAL  Final  Culture, blood (Routine X 2) w Reflex to ID Panel     Status: None   Collection Time: 03/23/17  2:06 PM  Result Value Ref Range Status   Specimen Description BLOOD RIGHT ARM  Final   Special Requests   Final    BOTTLES DRAWN AEROBIC ONLY Blood Culture adequate volume   Culture NO GROWTH 5 DAYS  Final   Report Status 03/28/2017 FINAL  Final  Culture, blood (routine x 2)     Status: None (Preliminary result)   Collection Time: 03/26/17  2:31 PM  Result Value Ref Range Status   Specimen Description BLOOD LEFT ARM  Final   Special Requests IN PEDIATRIC BOTTLE Blood Culture adequate volume  Final   Culture NO GROWTH 2 DAYS  Final   Report Status PENDING  Incomplete  Culture, blood (routine x 2)     Status: None (Preliminary result)   Collection Time: 03/26/17  2:31 PM  Result Value Ref Range Status   Specimen Description BLOOD RIGHT HAND  Final   Special  Requests IN PEDIATRIC BOTTLE Blood Culture adequate volume  Final   Culture NO GROWTH 2 DAYS  Final   Report Status PENDING  Incomplete  MRSA PCR Screening     Status: None   Collection Time: 03/27/17  8:09 AM  Result Value Ref Range Status   MRSA by PCR NEGATIVE NEGATIVE Final    Comment:        The GeneXpert MRSA Assay (FDA approved for NASAL specimens only), is one component of a comprehensive MRSA colonization surveillance program. It is not intended to diagnose MRSA infection nor to guide or monitor treatment for MRSA infections.       Radiology Studies: No results found.   Medications:  Scheduled: . calcium carbonate (dosed in mg elemental calcium)  250 mg of elemental calcium Oral TID WC  . chlorhexidine gluconate (MEDLINE KIT)  15 mL Mouth Rinse BID  . [START ON 04/02/2017] darbepoetin (ARANESP) injection - DIALYSIS  200 mcg Intravenous Q Thu-HD  . dextrose  1 ampule Intravenous Once  . [name]'s butt cream   Topical QID  . heparin  5,000 Units Subcutaneous Q8H  . insulin aspart  0-15 Units Subcutaneous Q4H  . insulin glargine  8 Units Subcutaneous Daily  . mouth rinse  15 mL Mouth Rinse QID  . multivitamin  1 tablet Oral QHS  . pantoprazole sodium  40 mg Per Tube Q24H  . saccharomyces boulardii  250 mg Oral BID   Continuous: . sodium chloride 65 mL/hr at 03/28/17 1235  . 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)   Diabetic osteomyelitis (HCC)   Dehiscence of amputation stump (HCC)   Osteomyelitis (HCC)   Chronic pain syndrome   Cardiac arrest,  cause unspecified (HCC)   Pressure injury of skin   Wound dehiscence   Coma (HCC)   Elevated liver enzymes   Jaundice   Encounter for central line placement   Protein calorie malnutrition (HCC)   DNR (do not resuscitate) discussion   Palliative care by specialist   Tachycardia   Goals of care, counseling/discussion   Anoxic brain injury (HCC)   Palliative care encounter   Tracheostomy status (HCC)   Abscess of left leg   Hypoxemia   Fever   Aspiration into airway    Acute /chronic hypoxemic respiratory failure in a setting of cardiac arrest Last ventilator use was on night of 6/19-6/20. On TC for several days. PCCM following for resp issues -tracheostomy in place. Change to #4 cuffless trach. S/P MBS 6/23. Now status post FEES 6/27. Patient started on nectar thick liquids and dysphagia 1 diet 6/27. Holding off on PEG for now. Cortrak removed 6/28. Unfortunately patient has aspirated again. She was febrile and tachypneic. Repeat chest x-ray on 6/29 showed new right-sided infiltrate. She was started on Fortaz on 6/29 and changed over to Zosyn to provide anaerobic coverage. Seems to be stable.  Tachycardia Sinus tachycardia, occasional SVT, likely secondary to low-grade fever. Blood pressure dropped when she was given carvedilol. This has been discontinued by nephrology. She is getting IV fluids. Repeat 2-D echo shows EF 25-30%, small pericardial effusion, previously EF was 30-35%. Persistent tachycardia could be contributing to his cardiomyopathy as well. Patient was on carvedilol. However, she was not getting this consistently due to low blood pressures. This has been discontinued for now. Heart rate has improved some and could be due to IV fluids.   Cardiac arrest on 5/19 with anoxic brain  injury Patient has shown some cognitive improvement over the last few days. She has been noted to follow certain commands at times. Her prognosis remains guarded. She has been seen by neurology.    Systemic inflammatory response syndrome/Persistent leukocytosis/aspiration pneumonia Patient developed recurrent aspiration on 6/28 and 29. She was started back on antibiotics. WBC is improved today. Heart rate is improved. Continue Zosyn for now. Initially thought to be secondary to left foot gangrene/infection and abscess-she is now status post AKA and still continues to have high WBC. Blood cultures on 6/1, 6/13 were negative. Repeat blood cultures 6/25 without any growth as well. Chest x-ray from 6/29 to show new right-sided infiltrate which could be the reason for her increase count. She has been tested for C. difficile as well, which has been negative. She underwent CT scan of her chest on 6/24 which was negative for PE. She has completed multiple different courses of antibiotics with the last course of vancomycin and Fortaz completed on 6/20. Central line was discontinued 6/26. Started on Fortaz 6/29 and changed to Zosyn. 6/30.   Diarrhea, continues to have loose stools Continue probiotics and Imodium as needed. Try to discontinue Flexiseal.  Left trans-metatarsal stump wound dehiscence with infection and gangrene Per operative note-pus/abscess noted extending up to the knee when BKA was attempted, subsequently underwent a AKA. Note, initially family was very reluctant to proceed with amputation, but given persistent leukocytosis/fever and potential risk of sepsis, family agreed for amputation, subsequently AKA was done on 6/15. Orthopedics removed wound VAC 6/20. Requested Dr Duda to revaluate left leg stump 6/28. He will be in the hospital tomorrow. There is some serous drainage from the left stump. Exposed bone is noted. This will be dressed again. Orthopedics to evaluate tomorrow. Patient also    appears to have developed a pressure wound in the right heel. No drainage, no foul smell. No erythema noted. Consulted wound care nurse.  ESRD Nephrology following-HD on TTS. Low blood pressure will create challenges for hemodialysis.  Normocytic anemia Likely secondary to chronic disease-probably worsened by acute illness. No signs of bleeding, hemoglobin has been stable with some fluctuations.  Diabetes mellitus type 2  Continue with SSI. On Lantus. No recent HbA1c. The last one was from January and was 5.2.  Chronic systolic heart failure/nonischemic cardiomyopathy Volume is being managed with hemodialysis. Patient was placed on beta blocker, but she has not been able to due to low blood pressure. Coreg has been discontinued. Echocardiogram reports as above. EF 25-30% per last echocardiogram.  Pericardial effusion Seen on TEE-with no evidence of tamponade pathophysiology. Repeat echo 6/26 with no significant pericardial effusion.  Moderate protein calorie malnutrition Started orals 6/27. Aspiration precautions.   Goals of care Prognosis is guarded. Palliative medicine is following and assisting with management.   DVT Prophylaxis: Subcutaneous heparin    Code Status: Full code  Family Communication: Discussed with the mother  Disposition Plan: Management as outlined above.    LOS: 43 days   KRISHNAN,GOKUL  [facility] Pager [phone] 03/29/2017, 7:36 AM  If 7PM-7AM, please contact night-coverage at [email or web address], password [email or web address]   

## 2017-03-29 NOTE — Progress Notes (Signed)
  Speech Language Pathology Treatment: Dysphagia;Cognitive-Linquistic;Passy Muir Speaking valve  Patient Details Name: [name] MRN: [id number] DOB: 04/20/1981 Today's Date: 03/29/2017 Time: 1020-1047 SLP Time Calculation (min) (ACUTE ONLY): 27 min  Assessment / Plan / Recommendation Clinical Impression  SLP visited pt and mother. We discussed her PO intake and precautions. Mother and RN report pt has not shown any specific signs of aspiration and has been wearing PMSV with PO intake though reminders to staff are sometimes needed. Her main risk factors for aspiration are waxing and waning arousal as well as positioning and oral hygiene. As long as pt is bed bound with no mobility with intermittent attention, she is likely to have poor ability to sense and expectorate her secretions with high risk of aspiration of oral bacteria. Pt took cup sips of nectar thick liquids with hand over hand assist with no difficulty observed. Recommend pt continue current diet with precautions.   Pt is progressing with PMSV use, can tolerate the valve with full family supervision during all waking hours. She audibly said her name today, also "Mama." She mouths short phrases to her mother and will likely start phonating more regularly with opportunities. At this point it is important to encourage attention and arousal. Pt would benefit from use of a lift to a wheel chair if this is possible. She has been in ICU/step down for almost 2 months and needs more activity, sunlight, etc. I discussed encouraging a a schedule and discussing time of day to help orient her and decrease her wakefulness at night.    HPI HPI: Grazia Dudzinski a 36 y.o.femalewith a history of ESRD on HD T TH S, COPD/ Asthma , D s/p MI 04/2015, HLD, HTN, CHF, chronic anemia, DM, and a history of L foot partial amputation 09/2016 with revision in 12/2016 for dehiscence, presenting to the ED with worsening LLE pain, swelling, increased drainage at the stump,  and chills. ETT 5/19 and trach'd 5/31. Hospital course included cardiac arrest, transfer to ICU      SLP Plan  Continue with current plan of care       Recommendations  Diet recommendations: Dysphagia 1 (puree);Nectar-thick liquid Liquids provided via: Cup Medication Administration: Crushed with puree      Patient may use Passy-Muir Speech Valve: During all waking hours (remove during sleep);During PO intake/meals;Caregiver trained to provide supervision PMSV Supervision: Full         Oral Care Recommendations: Oral care BID Follow up Recommendations: Skilled Nursing facility SLP Visit Diagnosis: Dysphagia, pharyngeal phase (R13.13) Plan: Continue with current plan of care       GO                Dandre Sisler, [name] 03/29/2017, 10:47 AM

## 2017-03-29 NOTE — Progress Notes (Addendum)
Subjective:  Unable to complete yesterday due to BP's in the 60's and 70's.  Gave IVF's overnight and HR better this am.  Temp/ WBC coming down after IV abx started   Objective Vital signs in last 24 hours: Vitals:   03/29/17 0327 03/29/17 0453 03/29/17 0557 03/29/17 0821  BP:  108/76  (!) 89/58  Pulse: 99 (!) 101    Resp: (!) 23 (!) 28    Temp:  97.5 F (36.4 C)  98 F (36.7 C)  TempSrc:  Oral  Oral  SpO2: 100% 98%    Weight:   80.4 kg (177 lb 4 oz)   Height:       Weight change: 0 kg (0 lb)  Intake/Output Summary (Last 24 hours) at 03/29/17 1145 Last data filed at 03/29/17 0600  Gross per 24 hour  Intake           932.08 ml  Output            -1400 ml  Net          2332.08 ml   Physical Exam: General: tracking some- following some commands- on just O2 via trach Heart: tachy Lungs: coarse BS bilat ant, no rales or wheezing Abdomen: obese, soft Extremities: R heel w/ new posterior ulcer, dry, no odor; L AKA with wound vac Dialysis Access: avf on left - good thrill and bruit   Dialysis:  [facility] TTS 4h 52mn   F200   114kg   2/2 bath  LUA AVF  Hep none Iron Sucrose (Venofer) 50 mg IVP During Dialysis 1X Week  Mircera 75 mcg IV q  2 weeks ( last on 02/05/17) Vitamin D (Calcitriol) Oral 0.75 mcg  Po q hd    Summary: Pt is a 36y.o. yo female who was admitted on 02/14/2017 with arrest- now with anoxic brain injury, trach and s/p L AKA this hosp   Assess: 1. Anoxic brain injury- has made some progress. Eating solids now, NG out. 2. Fever/ PNA /^WBC - on IV abx, wbc/ fevers improving 3. Hypotension/ tachycardia - poss hypovolemia, giving IVF's cautiously w/ hx CM EF25% 4. ESRD - on TTS HD, started HD June '17. Unable to dialyze yesterday due to hypotension. Not CRRT candidate given severe comorbidities. Reassess daily and will dialyze if BP will allow. PC team following, have updated pt's mother.  5. Anemia- hgb 9.5 which is up, on max aranesp 6. MBD of CKD - Ca and Phos in  range, no binders. PTH 88 here.   7. HTN - soft BP's and tachy, no BP meds needed 8. PVD- s/p L AKA- wound vac 9. DM2 - per primary  Plan - cont IVF's for now. O/W as above.     RKelly SplinterMD CNewell Rubbermaidpgr ([phone]  03/29/2017, 11:45 AM       Labs: Basic Metabolic Panel:  Recent Labs Lab 03/26/17 0352 03/28/17 0308 03/29/17 0421  NA 138 144 138  K 4.3 5.0 4.2  CL 95* 99* 94*  CO2 27 27 26   GLUCOSE 182* 171* 112*  BUN 46* 91* 60*  CREATININE 3.28* 7.38* 5.29*  CALCIUM 9.6 10.5* 9.3   Liver Function Tests:  Recent Labs Lab 03/24/17 0800 03/26/17 0352 03/28/17 0308  AST 34 33 23  ALT 17 20 15   ALKPHOS 350* 355* 290*  BILITOT 1.5* 1.5* 1.4*  PROT 8.5* 8.3* 8.3*  ALBUMIN 3.9 3.4* 3.0*   No results for input(s): LIPASE, AMYLASE in the  last 168 hours. No results for input(s): AMMONIA in the last 168 hours. CBC:  Recent Labs Lab 03/23/17 0303 03/24/17 0800 03/26/17 0352 03/28/17 0308 03/29/17 0421  WBC 20.5* 18.8* 23.7* 36.8* 21.5*  HGB 8.3* 9.5* 9.4* 9.3* 8.7*  HCT 28.8* 33.4* 33.0* 31.9* 30.4*  MCV 99.7 99.7 100.6* 99.1 98.7  PLT 486* 441* 453* 458* 387   Cardiac Enzymes: No results for input(s): CKTOTAL, CKMB, CKMBINDEX, TROPONINI in the last 168 hours. CBG:  Recent Labs Lab 03/28/17 1747 03/28/17 2007 03/28/17 2344 03/29/17 0439 03/29/17 0820  GLUCAP 160* 188* 132* 118* 113*    Iron Studies: No results for input(s): IRON, TIBC, TRANSFERRIN, FERRITIN in the last 72 hours. Studies/Results: Dg Chest Port 1 View  Result Date: 03/29/2017 CLINICAL DATA:  Follow-up pneumonia EXAM: PORTABLE CHEST 1 VIEW COMPARISON:  03/27/2017 FINDINGS: Cardiomegaly with vascular congestion. No overt edema. No effusions. Improving infiltrate in the right lung. IMPRESSION: Cardiomegaly with vascular congestion. Improving airspace opacity in the right lung. Electronically Signed   By: Rolm Baptise M.D.   On: 03/29/2017 11:02    Medications: Infusions: . sodium chloride 65 mL/hr at 03/29/17 0817  . piperacillin-tazobactam (ZOSYN)  IV 3.375 g (03/29/17 1022)    Scheduled Medications: . calcium carbonate (dosed in mg elemental calcium)  250 mg of elemental calcium Oral TID WC  . chlorhexidine gluconate (MEDLINE KIT)  15 mL Mouth Rinse BID  . [START ON 04/02/2017] darbepoetin (ARANESP) injection - DIALYSIS  200 mcg Intravenous Q Thu-HD  . dextrose  1 ampule Intravenous Once  . [name]'s butt cream   Topical QID  . heparin  5,000 Units Subcutaneous Q8H  . insulin aspart  0-15 Units Subcutaneous Q4H  . insulin glargine  8 Units Subcutaneous Daily  . mouth rinse  15 mL Mouth Rinse QID  . multivitamin  1 tablet Oral QHS  . pantoprazole sodium  40 mg Per Tube Q24H  . saccharomyces boulardii  250 mg Oral BID    have reviewed scheduled and prn medications.

## 2017-03-30 LAB — CBC
HCT: 28.4 % — ABNORMAL LOW (ref 36.0–46.0)
Hemoglobin: 8.1 g/dL — ABNORMAL LOW (ref 12.0–15.0)
MCH: 27.6 pg (ref 26.0–34.0)
MCHC: 28.5 g/dL — AB (ref 30.0–36.0)
MCV: 96.9 fL (ref 78.0–100.0)
PLATELETS: 387 10*3/uL (ref 150–400)
RBC: 2.93 MIL/uL — ABNORMAL LOW (ref 3.87–5.11)
RDW: 17.1 % — ABNORMAL HIGH (ref 11.5–15.5)
WBC: 15.7 10*3/uL — ABNORMAL HIGH (ref 4.0–10.5)

## 2017-03-30 LAB — BASIC METABOLIC PANEL
Anion gap: 16 — ABNORMAL HIGH (ref 5–15)
BUN: 71 mg/dL — AB (ref 6–20)
CALCIUM: 9 mg/dL (ref 8.9–10.3)
CO2: 26 mmol/L (ref 22–32)
CREATININE: 6.19 mg/dL — AB (ref 0.44–1.00)
Chloride: 96 mmol/L — ABNORMAL LOW (ref 101–111)
GFR calc Af Amer: 9 mL/min — ABNORMAL LOW (ref >60)
GFR, EST NON AFRICAN AMERICAN: 8 mL/min — AB (ref >60)
GLUCOSE: 84 mg/dL (ref 65–99)
POTASSIUM: 4.4 mmol/L (ref 3.5–5.1)
SODIUM: 138 mmol/L (ref 135–145)

## 2017-03-30 LAB — GLUCOSE, CAPILLARY
GLUCOSE-CAPILLARY: 102 mg/dL — AB (ref 65–99)
GLUCOSE-CAPILLARY: 124 mg/dL — AB (ref 65–99)
GLUCOSE-CAPILLARY: 81 mg/dL (ref 65–99)
Glucose-Capillary: 94 mg/dL (ref 65–99)
Glucose-Capillary: 133 mg/dL — ABNORMAL HIGH (ref 65–99)
Glucose-Capillary: 125 mg/dL — ABNORMAL HIGH (ref 65–99)

## 2017-03-30 LAB — HEMOGLOBIN A1C
HEMOGLOBIN A1C: 5.9 % — AB (ref 4.8–5.6)
Mean Plasma Glucose: 123 mg/dL

## 2017-03-30 NOTE — Progress Notes (Signed)
Subjective:  Afeb- hemodynamically stable - getting some IVF  Objective Vital signs in last 24 hours: Vitals:   03/30/17 0334 03/30/17 0400 03/30/17 0500 03/30/17 0753  BP: 112/72 114/78 101/89   Pulse: 94 92 92 93  Resp: (!) _0 (!) 25  Temp: 97.6 F (36.4 C)     TempSrc: Axillary     SpO2: 100% 100% 100% 99%  Weight:      Height:       Weight change: 4 kg (8 lb 13.1 oz)  Intake/Output Summary (Last 24 hours) at 03/30/17 0804 Last data filed at 03/30/17 0500  Gross per 24 hour  Intake             1465 ml  Output                0 ml  Net             1465 ml   Physical Exam: General: more alert, answering questions - on just O2 via trach Heart: tachy Lungs: coarse BS bilat ant, no rales or wheezing Abdomen: obese, soft Extremities: R heel w/ new posterior ulcer, dry, no odor; L AKA with wound vac Dialysis Access: avf on left - good thrill and bruit   Dialysis:  [facility] TTS 4h 23mn   F200   114kg   2/2 bath  LUA AVF  Hep none Iron Sucrose (Venofer) 50 mg IVP During Dialysis 1X Week  Mircera 75 mcg IV q  2 weeks ( last on 02/05/17) Vitamin D (Calcitriol) Oral 0.75 mcg  Po q hd    Summary: Pt is a 36y.o. yo female who was admitted on 02/14/2017 with arrest- now with anoxic brain injury, trach and s/p L AKA this hosp   Assess: 1. Anoxic brain injury- has made some progress. Eating solids now, NG out. 2. Fever/ PNA /^WBC - on IV abx, wbc/ fevers improving- cx neg 3. Hypotension/ tachycardia - poss hypovolemia, giving IVF's cautiously w/ hx CM EF25% 4. ESRD - on TTS HD, started HD June '17. Unable to dialyze fully Saturday due to hypotension. Not CRRT candidate given severe comorbidities. Reassess daily and will dialyze if BP will allow. No indication today - will plan for tomorrow (Tues)  5. Anemia- on max aranesp- hgb dropping  6. MBD of CKD - Ca and Phos in range, on calc suspension. PTH 88 here.   7. HTN - soft BP's and tachy, no BP meds needed 8. PVD- s/p L AKA-  wound vac 9. DM2 - per primary  Jadah Bobak A  03/30/2017, 8:04 AM       Labs: Basic Metabolic Panel:  Recent Labs Lab 03/28/17 0308 03/29/17 0421 03/30/17 0227  NA 144 138 138  K 5.0 4.2 4.4  CL 99* 94* 96*  CO2 _1 GLUCOSE 171* 112* 84  BUN 91* 60* 71*  CREATININE 7.38* 5.29* 6.19*  CALCIUM 10.5* 9.3 9.0   Liver Function Tests:  Recent Labs Lab 03/24/17 0800 03/26/17 0352 03/28/17 0308  AST 34 33 23  ALT _2 ALKPHOS 350* 355* 290*  BILITOT 1.5* 1.5* 1.4*  PROT 8.5* 8.3* 8.3*  ALBUMIN 3.9 3.4* 3.0*   No results for input(s): LIPASE, AMYLASE in the last 168 hours. No results for input(s): AMMONIA in the last 168 hours. CBC:  Recent Labs Lab 03/24/17 0800 03/26/17 0352 03/28/17 0308 03/29/17 0421 03/30/17 0227  WBC 18.8* 23.7* 36.8* 21.5* 15.7*  HGB 9.5* 9.4* 9.3*  8.7* 8.1*  HCT 33.4* 33.0* 31.9* 30.4* 28.4*  MCV 99.7 100.6* 99.1 98.7 96.9  PLT 441* 453* 458* 387 387   Cardiac Enzymes: No results for input(s): CKTOTAL, CKMB, CKMBINDEX, TROPONINI in the last 168 hours. CBG:  Recent Labs Lab 03/29/17 1237 03/29/17 1607 03/29/17 1954 03/29/17 2331 03/30/17 0332  GLUCAP 141* 147* 203* 110* 102*    Iron Studies: No results for input(s): IRON, TIBC, TRANSFERRIN, FERRITIN in the last 72 hours. Studies/Results: Dg Chest Port 1 View  Result Date: 03/29/2017 CLINICAL DATA:  Follow-up pneumonia EXAM: PORTABLE CHEST 1 VIEW COMPARISON:  03/27/2017 FINDINGS: Cardiomegaly with vascular congestion. No overt edema. No effusions. Improving infiltrate in the right lung. IMPRESSION: Cardiomegaly with vascular congestion. Improving airspace opacity in the right lung. Electronically Signed   By: Rolm Baptise M.D.   On: 03/29/2017 11:02   Medications: Infusions: . sodium chloride 65 mL/hr at 03/29/17 0817  . piperacillin-tazobactam (ZOSYN)  IV Stopped (03/30/17 0119)    Scheduled Medications: . calcium carbonate (dosed in mg elemental  calcium)  250 mg of elemental calcium Oral TID WC  . chlorhexidine gluconate (MEDLINE KIT)  15 mL Mouth Rinse BID  . [START ON 04/02/2017] darbepoetin (ARANESP) injection - DIALYSIS  200 mcg Intravenous Q Thu-HD  . dextrose  1 ampule Intravenous Once  . [name]'s butt cream   Topical QID  . heparin  5,000 Units Subcutaneous Q8H  . insulin aspart  0-15 Units Subcutaneous Q4H  . insulin glargine  8 Units Subcutaneous Daily  . mouth rinse  15 mL Mouth Rinse QID  . multivitamin  1 tablet Oral QHS  . pantoprazole sodium  40 mg Per Tube Q24H  . saccharomyces boulardii  250 mg Oral BID    have reviewed scheduled and prn medications.

## 2017-03-30 NOTE — Consult Note (Addendum)
WOC Nurse wound consult note Reason for Consult: Consult requested for right heel Wound type: Deep tissue injury Pressure Injury POA: No Measurement: 3.5X3.5cm Wound bed: Darker colored intact skin; no open wound, fluctuance, or drainage Dressing procedure/placement/frequency: Prevalon boot is in place to reduce pressure.  Foam dressing to protect from further injury.  No topical treatment is indicated at this time; will plan to re-assess weekly.  Deep tissue injuries are high risk to evolve into full thickness tissue loss; topical treatment would be ordered if location eventually evolves into an open wound.  Discussed plan of care with mother at the bedside; she is upset and states this occurred while the patient was here in the hospital. Cammie Mcgee MSN, RN, CWOCN, [place], CNS [phone].

## 2017-03-30 NOTE — Progress Notes (Signed)
PULMONARY / CRITICAL CARE MEDICINE   Name: [name] MRN: [id number] DOB: [birth year]    ADMISSION DATE:  02/14/2017 CONSULTATION DATE:  02/14/2017  REFERRING MD:  Feliz Beam MD  CHIEF COMPLAINT:  Sepsis, cardiac arrest  BRIEF SUMMARY:   36 year old female w/ ESRD d/t poorly controlled DM, bilateral transmetatarsal foot amputations, obesity, HTN, medical non-compliance as well as NICM. Admitted 5/19 with complaints of left leg pain, found to have radiological evidence of osteomyelitis with wound nonhealing, dehiscence, purulent drainage from the wound. Admitted for IV antibiotics, hemodialysis and possible surgery.  Found unresponsive in the room on 5/19. She had chest compressions, epi bicarb given. She was intubated by anesthesia and had ROSC in approximately 9 minutes. Downtime prior to code is unknown. She got dilaudid in the afternoon for leg pain.  SUBJECTIVE:  Off vent since 6/18.  Continues to not be able to dialyze fully due to hypotension.  VITAL SIGNS: BP 126/75 (BP Location: Right Arm)   Pulse 94   Temp 97 F (36.1 C) (Oral)   Resp (!) 26   Ht 5' 8.5" (1.74 m)   Wt 81 kg (178 lb 9.2 oz)   LMP  (LMP Unknown) Comment: Patient trached  SpO2 99%   BMI 26.76 kg/m   VENTILATOR SETTINGS: FiO2 (%):  [40 %] 40 %  INTAKE / OUTPUT: I/O last 3 completed shifts: In: 2100 [I.V.:1950; IV Piggyback:150] Out: 100 [Stool:100]  General:  Chronically ill appearing female, NAD  HEENT: MM pink/moist, trach site c/d/i Neuro:  Awake, sliding around in bed, tracks appropriately but does not follow commands  CV: s1s2 rrr, no m/r/g PULM: even/non-labored, diminished bases  WU:JWJX, non-tender, bsx4 active  Extremities: warm/dry, 1+ BLE edema  Skin: no rashes or lesions  LABS:  BMET  Recent Labs Lab 03/28/17 0308 03/29/17 0421 03/30/17 0227  NA 144 138 138  K 5.0 4.2 4.4  CL 99* 94* 96*  CO2 27 26 26   BUN 91* 60* 71*  CREATININE 7.38* 5.29* 6.19*  GLUCOSE 171* 112* 84    Electrolytes  Recent Labs Lab 03/28/17 0308 03/29/17 0421 03/30/17 0227  CALCIUM 10.5* 9.3 9.0   CBC  Recent Labs Lab 03/28/17 0308 03/29/17 0421 03/30/17 0227  WBC 36.8* 21.5* 15.7*  HGB 9.3* 8.7* 8.1*  HCT 31.9* 30.4* 28.4*  PLT 458* 387 387   No results for input(s): PHART, PCO2ART, PO2ART in the last 168 hours.  Liver Enzymes  Recent Labs Lab 03/24/17 0800 03/26/17 0352 03/28/17 0308  AST 34 33 23  ALT 17 20 15   ALKPHOS 350* 355* 290*  BILITOT 1.5* 1.5* 1.4*  ALBUMIN 3.9 3.4* 3.0*   Glucose  Recent Labs Lab 03/29/17 1237 03/29/17 1607 03/29/17 1954 03/29/17 2331 03/30/17 0332 03/30/17 0807  GLUCAP 141* 147* 203* 110* 102* 94   STUDIES:  Lower extremity ultrasound 5/18 >> no evidence of DVT CT head 5/19 >> normal exam Echo 5/20 >> EF 30-35%, increased LVF. Diffuse hypokinesis, akinesis of basilar mid-inferior myocardium, grade 2 diastolic dysfxn  EEG 5/20 >> finding c/w mod to severe global cerebral dysfxn. C/w anoxic injury given clinical course  LE [place] 5/21 >> negative MRI brain 5/24 >> ?thrombosed cortical vein, otherwise normal TEE 6/4 >> mild MR, normal AV, mild TR, mild PR, EF 30-35%, diffuse hypokinesis, no thrombus, no PFO, normal RV, moderate pericardial effusion with synechia suggesting some chronicity, no vegetations   CULTURES: Bcx 5/19 X 2 >> negative Blood cultures 6/1 >> negative c diff 6/1 >>  negative  ANTIBIOTICS: Clinidamycin 5/19 >> 5/21 Vancomycin 5/19 >> 5/25 >> restart 5/30 Zosyn 5/19 >> 5/22 Meropenem 5/22 >> 5/30 Flagyl 5/30 >>off Ceftriaxone 5/30 >>off  SIGNIFICANT EVENTS: 5/19  Admit, cardiac arrest, transfer to ICU; hypothermia protocol started 5/24  Off pressors.  MRI w/out evidence of ischemia  6/03  Tolerated ATC couple hours, then back to vent, HD   LINES/TUBES: ETT 5/19>>>5/31 Lt IJ CVL 5/19>>>out Trach 5/31>>>  ASSESSMENT / PLAN:  Discussion:  36 y/o F with PMH of poorly controlled DM, ESRD on HD  and known gangrene of L achilles heel, admitted with non-healing wounds.  Admitted per TRH.  Received dilaudid for pain and subsequently suffered a cardiac arrest.  Trach dependent respiratory failure post cardiac arrest - currently with #4 uncuffed shiley P: Maintain on TC as tolerated Trach care per protocol PT as able Intermittent CXR Speech following   PCCM will f/u once weekly, please call sooner if needed.    Rutherford Guys, [place] [facility] Pager: [phone]  or [phone] 03/30/2017, 12:20 PM   STAFF NOTE: I, Rory Percy, MD FACP have personally reviewed patient's available data, including medical history, events of note, physical examination and test results as part of my evaluation. I have discussed with resident/NP and other care providers such as pharmacist, RN and RRT. In addition, I personally evaluated patient and elicited key findings of: sleeping, mom sleeping, no distress, rr wnl, no secretions on trach collar, slight crackles throughout, PMV with strict observation maintained, trach collar toleratd, not fully HD secondary to BP, doing well over all with 4 trach size without cuff, her lack of HD tolerance and risk edema/ resp failure / neuro status progress are deterants to decannulation, in addition to her lack of PMV success for long time periods, need to increase PMV use with direct to observation  Mcarthur Rossetti. Tyson Alias, MD, FACP Pgr: [phone] [facility] 03/30/2017 1:05 PM

## 2017-03-30 NOTE — Care Management (Signed)
Case manager continues to monitor patient for appropriate discharge plans.

## 2017-03-30 NOTE — Evaluation (Signed)
Physical Therapy Evaluation Patient Details Name: [name] MRN: [id number] DOB: 12/27/1980 Today's Date: 03/30/2017   History of Present Illness  [name] is a 36 y.o. 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 ETT 5/19 and trach'd 5/31.   Clinical Impression  Pt admitted with above diagnosis. Pt currently with functional limitations due to the deficits listed below (see PT Problem List). Pt has not mobilized in roughly 6 weeks. Sat EOB x15 mins with max A for bed mobility and mod A to maintain sitting. Educated patient and mother on exercises that could be done in bed. Pt will benefit from skilled PT to increase their independence and safety with mobility to allow discharge to the venue listed below.       Follow Up Recommendations SNF;Supervision/Assistance - 24 hour    Equipment Recommendations  Other (comment) (TBD)    Recommendations for Other Services       Precautions / Restrictions Precautions Precautions: Fall Restrictions Weight Bearing Restrictions: Yes RLE Weight Bearing: Non weight bearing LLE Weight Bearing: Non weight bearing      Mobility  Bed Mobility Overal bed mobility: Needs Assistance Bed Mobility: Rolling;Sidelying to Sit;Sit to Supine Rolling: Mod assist Sidelying to sit: Max assist;+2 for safety/equipment   Sit to supine: +2 for physical assistance;Max assist   General bed mobility comments: pt able to grasp rail and pull trunk to side, mod A for managment of lower body during roll. Max A at LE's and trunk for SL to sit as well as return to supine  Transfers                 General transfer comment: unable at this point, spoke with RN about using lift for OOB to chair.    Ambulation/Gait                Stairs            Wheelchair Mobility    Modified Rankin (Stroke Patients Only)       Balance Overall balance assessment: Needs assistance Sitting-balance support: Bilateral upper extremity supported Sitting balance-Leahy Scale: Poor Sitting balance - Comments: mod A for maintaining sitting x15 mins. Frequent R lean with inability to correct. Pt had diffciulty maintaining head control. Was able to wash face with hand over hand assist. BP dropped 119/ 62 to 97/62.  Postural control: Right lateral lean;Posterior lean                                   Pertinent Vitals/Pain Pain Assessment: Faces Faces Pain Scale: Hurts even more Pain Location: R foot to touch Pain Descriptors / Indicators: Burning Pain Intervention(s): Limited activity within patient's tolerance;Monitored during session    Home Living Family/patient expects to be discharged to:: Private residence Living Arrangements: Spouse/significant other;Children Available Help at Discharge: Family;Available 24 hours/day Type of Home: Apartment Home Access: Stairs to enter Entrance Stairs-Rails: Right Entrance Stairs-Number of Steps: full flight Home Layout: One level   Additional Comments: Pt lives with her boyfriend.     Prior Function Level of Independence: Independent         Comments: chart from  1/18 reports pt ambulating and using her w/c around the house. But now hasn't been up since AKA     Hand Dominance   Dominant Hand: Right    Extremity/Trunk Assessment   Upper Extremity Assessment Upper Extremity Assessment: Generalized weakness    Lower Extremity Assessment Lower Extremity Assessment: Generalized weakness    Cervical / Trunk Assessment Cervical / Trunk Assessment: Kyphotic  Communication   Communication: Tracheostomy  Cognition Arousal/Alertness: Lethargic Behavior During Therapy: WFL for tasks assessed/performed;Flat  affect Overall Cognitive Status: Impaired/Different from baseline Area of Impairment: Attention;Following commands;Awareness;Problem solving                   Current Attention Level: Focused   Following Commands: Follows one step commands inconsistently;Follows one step commands with increased time   Awareness: Intellectual Problem Solving: Slow processing;Decreased initiation;Requires verbal cues;Requires tactile cues General Comments: all processing very slow, pt childlike and limited attempt at verbalization with trach      General Comments      Exercises General Exercises - Lower Extremity Heel Slides: AAROM;Right;10 reps;Supine;Other (comment) (with R heel off bed) Straight Leg Raises: AAROM;Right;Left;10 reps;Supine   Assessment/Plan    PT Assessment Patient needs continued PT services  PT Problem List Decreased strength;Decreased range of motion;Decreased activity tolerance;Decreased balance;Decreased mobility;Decreased coordination;Decreased cognition;Pain;Obesity       PT Treatment Interventions DME instruction;Functional mobility training;Therapeutic activities;Therapeutic exercise;Balance training;Cognitive remediation;Neuromuscular re-education;Patient/family education;Wheelchair mobility training    PT Goals (Current goals can be found in the Care Plan section)  Acute Rehab PT Goals Patient Stated Goal: get more mobile PT Goal Formulation: With patient Time For Goal Achievement: 04/13/17 Potential to Achieve Goals: Fair    Frequency Min 3X/week   Barriers to discharge        Co-evaluation               AM-PAC PT "6 Clicks" Daily Activity  Outcome Measure Difficulty turning over in bed (including adjusting bedclothes, sheets and blankets)?: Total Difficulty moving from lying on back to sitting on the side of the bed? : Total Difficulty sitting down on and standing up from a chair with arms (e.g., wheelchair, bedside commode, etc,.)?:  Total Help needed moving to and from a bed to chair (including a wheelchair)?: Total Help needed walking in hospital room?: Total Help needed climbing 3-5 steps with a railing? : Total 6 Click Score: 6    End of Session Equipment Utilized During Treatment: Oxygen Activity Tolerance: Patient limited by fatigue Patient left: in bed;with call bell/phone within reach;with family/visitor present Nurse Communication: Mobility status;Need for lift equipment PT Visit Diagnosis: Muscle weakness (generalized) (M62.81);Pain;Other abnormalities of gait and mobility (R26.89) Pain - Right/Left: Right Pain - part of body: Ankle and joints of foot    Time: 1615-1701 PT Time Calculation (min) (ACUTE ONLY): 46 min   Charges:   PT Evaluation $PT Eval High Complexity: 1 Procedure PT Treatments $Therapeutic Activity: 23-37 mins   PT G Codes:        Lyanne Co, PT  Acute Rehab Services  [phone]   Lawana Chambers Lamisha Roussell 03/30/2017, 5:16 PM

## 2017-03-30 NOTE — Progress Notes (Addendum)
[facility] PROGRESS NOTE  [name] BBC:[id number] DOB: 19-Mar-1981 DOA: 02/14/2017  PCP: Vicenta Aly, FNP  Brief History/Interval Summary: Patient is a 36 y.o. female with history of ESRD on HD, poorly controlled diabetes, history of bilateral transmetatarsal foot amputation with subsequent left foot wound dehiscence (refused BKA as outpatient), chronic systolic heart failure felt to be secondary to nonischemic cardiomyopathy admitted on 02/14/17 with wound dehiscence and persistent drainage from the left trans-metatarsal amputation site. She was [facility]. She was subsequently found to be unresponsive in her room on 5/19 after getting IV Dilaudid-she was  in asystole, CPR was started, with ROSC in around 9 minutes.She was then transferred to the intensive care unit, and subsequently underwent cooling and then rewarming. She was unable to be liberated off the ventilator for several weeks , and as a result underwent tracheostomy on 5/31. Patient has been evaluated by neurology during this hospital stay, per neurology chances of meaningful neurological recovery was dismal. Hospital course has now been complicated by persistent leukocytosis, respiratory failure, and wound dehiscence with gangrene causing persistent SIRS pathophysiology and now suspected aspiration PNA. 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 Since then the patient has been weaned off the vent and maintained on trach collar.   Consultants:   Infectious disease:  Neurology  Gastroenterology  Wound care  PCCM  Nephrology  Orthopedics  Procedures:   Transthoracic echocardiogram 5/20 Study Conclusions  - Left ventricle: The cavity size was normal. Wall thickness was   increased in a pattern of mild LVH. Systolic function was   moderately to severely reduced. The estimated ejection fraction   was in the range of 30% to 35%. Diffuse hypokinesis. There is   akinesis of the  basal-midinferior myocardium. Features are   consistent with a pseudonormal left ventricular filling pattern,   with concomitant abnormal relaxation and increased filling   pressure (grade 2 diastolic dysfunction). - Mitral valve: There was moderate regurgitation. Valve area by   pressure half-time: 2.44 cm^2. - Left atrium: The atrium was mildly dilated. - Right ventricle: The cavity size was mildly dilated. Wall   thickness was normal. - Tricuspid valve: There was mild regurgitation. - Pulmonary arteries: Systolic pressure was mildly increased. PA   peak pressure: 43 mm Hg (S). - Pericardium, extracardiac: A moderate pericardial effusion was   identified circumferential to the heart. . Largest pocket of   fluid is posterior to right atrium (2cm). No evidence of   tamponade physiology.  Transthoracic echocardiogram 6/28 Study Conclusions  - Left ventricle: The cavity size was normal. There was mild   concentric hypertrophy. Systolic function was severely reduced.   The estimated ejection fraction was in the range of 25% to 30%.   Diffuse hypokinesis. Regional wall motion abnormalities cannot be   excluded. - Mitral valve: There was mild regurgitation. - Pericardium, extracardiac: A trivial pericardial effusion was   identified circumferential to the heart.  Antibiotics: She has previously completed multiple courses of antibiotics. Was started back on ceftazidime 6/29. Changed over to Zosyn to cover anaerobic organisms on 6/30.  Subjective/Interval History: Patient remains nonverbal. Does not appear to be in any distress. Her mother is at the bedside.  Objective:  Vital Signs  Vitals:   03/30/17 0333 03/30/17 0334 03/30/17 0400 03/30/17 0500  BP:  112/72 114/78 101/89  Pulse:  94 92 92  Resp:  (!) _0 Temp:  97.6 F (36.4 C)    TempSrc:  Axillary  SpO2:  100% 100% 100%  Weight: 81 kg (178 lb 9.2 oz)     Height:        Intake/Output Summary (Last 24 hours)  at 03/30/17 0731 Last data filed at 03/30/17 0500  Gross per 24 hour  Intake             1530 ml  Output                0 ml  Net             1530 ml   Filed Weights   03/28/17 1250 03/29/17 0557 03/30/17 0333  Weight: 78.5 kg (173 lb 1 oz) 80.4 kg (177 lb 4 oz) 81 kg (178 lb 9.2 oz)   Telemetry: Heart rate has improved. No longer in persistent sinus tachycardia.   General appearance: Awake. Distracted. No distress. Resp: Coarse breath sounds bilaterally. Somewhat improved in entry. Few crackles at the bases. No wheezing. No rhonchi. Cardio: S1, S2 is normal, regular. No S3, S4. No rubs, murmurs, or bruit GI: Abdomen is soft. Nontender, nondistended. Bowel sounds are present. No masses or organomegaly Extremities: Left AKA. Dressing was removed from the left stump yesterday. There is one area with exposed bone. No erythema. There is some serous drainage. No bleeding noted. Black area noted in the right heel. No erythema. No drainage. Neurologic: Awake, alert. Not communicative. Moving her extremities.  Lab Results:  Data Reviewed: I have personally reviewed following labs and imaging studies  CBC:  Recent Labs Lab 03/24/17 0800 03/26/17 0352 03/28/17 0308 03/29/17 0421 03/30/17 0227  WBC 18.8* 23.7* 36.8* 21.5* 15.7*  HGB 9.5* 9.4* 9.3* 8.7* 8.1*  HCT 33.4* 33.0* 31.9* 30.4* 28.4*  MCV 99.7 100.6* 99.1 98.7 96.9  PLT 441* 453* 458* 387 841    Basic Metabolic Panel:  Recent Labs Lab 03/24/17 0800 03/26/17 0352 03/28/17 0308 03/29/17 0421 03/30/17 0227  NA 140 138 144 138 138  K 4.1 4.3 5.0 4.2 4.4  CL 94* 95* 99* 94* 96*  CO2 _0 GLUCOSE 157* 182* 171* 112* 84  BUN 71* 46* 91* 60* 71*  CREATININE 4.26* 3.28* 7.38* 5.29* 6.19*  CALCIUM 9.9 9.6 10.5* 9.3 9.0    GFR: Estimated Creatinine Clearance: 14 mL/min (A) (by C-G formula based on SCr of 6.19 mg/dL (H)).  Liver Function Tests:  Recent Labs Lab 03/24/17 0800 03/26/17 0352 03/28/17 0308    AST 34 33 23  ALT _1 ALKPHOS 350* 355* 290*  BILITOT 1.5* 1.5* 1.4*  PROT 8.5* 8.3* 8.3*  ALBUMIN 3.9 3.4* 3.0*     CBG:  Recent Labs Lab 03/29/17 1237 03/29/17 1607 03/29/17 1954 03/29/17 2331 03/30/17 0332  GLUCAP 141* 147* 203* 110* 102*     Recent Results (from the past 240 hour(s))  Culture, blood (Routine X 2) w Reflex to ID Panel     Status: None   Collection Time: 03/23/17  2:06 PM  Result Value Ref Range Status   Specimen Description BLOOD RIGHT WRIST  Final   Special Requests   Final    BOTTLES DRAWN AEROBIC AND ANAEROBIC Blood Culture adequate volume   Culture NO GROWTH 5 DAYS  Final   Report Status 03/28/2017 FINAL  Final  Culture, blood (Routine X 2) w Reflex to ID Panel     Status: None   Collection Time: 03/23/17  2:06 PM  Result Value Ref Range Status   Specimen Description BLOOD RIGHT  ARM  Final   Special Requests   Final    BOTTLES DRAWN AEROBIC ONLY Blood Culture adequate volume   Culture NO GROWTH 5 DAYS  Final   Report Status 03/28/2017 FINAL  Final  Culture, blood (routine x 2)     Status: None (Preliminary result)   Collection Time: 03/26/17  2:31 PM  Result Value Ref Range Status   Specimen Description BLOOD LEFT ARM  Final   Special Requests IN PEDIATRIC BOTTLE Blood Culture adequate volume  Final   Culture NO GROWTH 3 DAYS  Final   Report Status PENDING  Incomplete  Culture, blood (routine x 2)     Status: None (Preliminary result)   Collection Time: 03/26/17  2:31 PM  Result Value Ref Range Status   Specimen Description BLOOD RIGHT HAND  Final   Special Requests IN PEDIATRIC BOTTLE Blood Culture adequate volume  Final   Culture NO GROWTH 3 DAYS  Final   Report Status PENDING  Incomplete  MRSA PCR Screening     Status: None   Collection Time: 03/27/17  8:09 AM  Result Value Ref Range Status   MRSA by PCR NEGATIVE NEGATIVE Final    Comment:        The GeneXpert MRSA Assay (FDA approved for NASAL specimens only), is one  component of a comprehensive MRSA colonization surveillance program. It is not intended to diagnose MRSA infection nor to guide or monitor treatment for MRSA infections.       Radiology Studies: Dg Chest Port 1 View  Result Date: 03/29/2017 CLINICAL DATA:  Follow-up pneumonia EXAM: PORTABLE CHEST 1 VIEW COMPARISON:  03/27/2017 FINDINGS: Cardiomegaly with vascular congestion. No overt edema. No effusions. Improving infiltrate in the right lung. IMPRESSION: Cardiomegaly with vascular congestion. Improving airspace opacity in the right lung. Electronically Signed   By: Rolm Baptise M.D.   On: 03/29/2017 11:02     Medications:  Scheduled: . calcium carbonate (dosed in mg elemental calcium)  250 mg of elemental calcium Oral TID WC  . chlorhexidine gluconate (MEDLINE KIT)  15 mL Mouth Rinse BID  . [START ON 04/02/2017] darbepoetin (ARANESP) injection - DIALYSIS  200 mcg Intravenous Q Thu-HD  . dextrose  1 ampule Intravenous Once  . [name]'s butt cream   Topical QID  . heparin  5,000 Units Subcutaneous Q8H  . insulin aspart  0-15 Units Subcutaneous Q4H  . insulin glargine  8 Units Subcutaneous Daily  . mouth rinse  15 mL Mouth Rinse QID  . multivitamin  1 tablet Oral QHS  . pantoprazole sodium  40 mg Per Tube Q24H  . saccharomyces boulardii  250 mg Oral BID   Continuous: . sodium chloride 65 mL/hr at 03/29/17 0817  . 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)   Normocytic anemia   Hyperlipidemia   Hypertension   Non-ischemic cardiomyopathy - by echo 8/16- EF 35-40%   Morbid obesity-    Acute combined systolic and diastolic heart  failure (HCC)   ESRD (end stage renal disease) (HCC)   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)   Diabetic osteomyelitis (HCC)   Dehiscence of amputation stump (HCC)   Osteomyelitis (HCC)   Chronic pain syndrome   Cardiac arrest, cause  unspecified ([place])   Pressure injury of skin   Wound dehiscence   Coma ([place])   Elevated liver enzymes   Jaundice   Encounter for central line placement   Protein calorie malnutrition ([place])   DNR (do not resuscitate) discussion   Palliative care by specialist   Tachycardia   Goals of care, counseling/discussion   Anoxic brain injury [facility])   Palliative care encounter   Tracheostomy status ([place])   Abscess of left leg   Hypoxemia   Fever   Aspiration into airway    Acute /chronic hypoxemic respiratory failure in a setting of cardiac arrest Last ventilator use was on night of 6/19-6/20. On TC for several days. PCCM following for resp issues -tracheostomy in place. Change to #4 cuffless trach. S/P MBS 6/23. Now status post FEES 6/27. Patient started on nectar thick liquids and dysphagia 1 diet 6/27. Holding off on PEG for now. Cortrak removed 6/28. Unfortunately patient has aspirated again. She was febrile and tachypneic. Repeat chest x-ray on 6/29 showed new right-sided infiltrate. She was started on [place] on 6/29 and changed over to Zosyn to provide anaerobic coverage. Seems to be stable. Chest x-ray from yesterday showed improving opacity in the right lung.  Systemic inflammatory response syndrome/Persistent leukocytosis/aspiration pneumonia Patient developed recurrent aspiration on 6/28-29. She was started back on antibiotics. She is improving. Heart rate is improved. Continue Zosyn for now. Initial infections were due to left foot gangrene and abscess. Now, she status post AKA. Blood cultures have been done multiple times and have been negative so far. She has had C. difficile testing, which was also been negative. CT scan did not show any PE.  She's completed multiple courses of antibiotics. Most recently started on Fortaz on 6/29 and changed to Zosyn on 6/30.   Tachycardia Patient was in persistent sinus tachycardia for many days. Could've been due to fever and infection. It's also possible that she might have been volume depleted. She was given IV fluids. She was started on antibiotics. Patient did not tolerate being on beta blocker with significant drop in blood pressures. However, over the last 48 hours her heart rate has significantly improved. Some of this could also be due to her cardiomyopathy and could also be contributing to her cardiomyopathy. No recent TSH, which we will check.   ESRD Low blood pressure has impeded dialysis. Nephrology is following closely. No dialysis today. If blood pressure continues to create challenges for dialysis then further discussions will need to be held with family. Patient apparently not a candidate for CRRT. Pressors, to keep BP up, are not a good as it will not provide a long-term solution.  Left trans-metatarsal stump wound dehiscence with infection and gangrene Per operative note-pus/abscess noted extending up to the knee when BKA was attempted, subsequently underwent a AKA. Note, initially family was very reluctant to proceed with amputation, but given persistent leukocytosis/fever and potential risk of sepsis, family agreed for amputation, subsequently AKA was done on 6/15. Orthopedics removed wound VAC 6/20. Dr. Sharol Given to evaluate her today. Discussed with him. Patient also appears to have developed a pressure wound in the right heel. No drainage, no foul smell. No erythema noted. Consulted wound care nurse.  Anoxic brain injury secondary to Cardiac arrest on 5/19 Patient has shown some cognitive improvement over the last few days. She has been noted to follow certain commands at times. Her prognosis remains guarded. She has been seen by neurology.    Diarrhea, continues to have loose  stools Continue probiotics and Imodium as needed. Try to discontinue Flexiseal.  Normocytic anemia Likely secondary to chronic disease-probably worsened by acute illness. No signs of bleeding, hemoglobin has been stable with some fluctuations.  Diabetes mellitus type 2  Continue with SSI. On Lantus. HbA1c 5.9.  Chronic systolic heart failure/nonischemic cardiomyopathy Volume was being managed with hemodialysis. Patient was placed on beta blocker, but she has not been able to due to low blood pressure. Coreg has been discontinued. Echocardiogram reports as above. EF 25-30% per last echocardiogram.  Pericardial effusion Seen on TEE-with no evidence of tamponade pathophysiology. Repeat echo 6/26 with no significant pericardial effusion.  Moderate protein calorie malnutrition Started orals 6/27. Aspiration precautions.   Goals of care Prognosis is guarded. Palliative medicine is following and assisting with management.   DVT Prophylaxis: Subcutaneous heparin    Code Status: Full code  Family Communication: Discussed with the mother  Disposition Plan: Management as outlined above.    LOS: 3 days   [facility] Pager [phone] 03/30/2017, 7:31 AM  If 7PM-7AM, please contact night-coverage at [email or web address], password [facility]

## 2017-03-31 ENCOUNTER — Encounter (HOSPITAL_COMMUNITY): Payer: Self-pay | Admitting: Orthopedic Surgery

## 2017-03-31 LAB — CULTURE, BLOOD (ROUTINE X 2)
CULTURE: NO GROWTH
CULTURE: NO GROWTH
SPECIAL REQUESTS: ADEQUATE
SPECIAL REQUESTS: ADEQUATE

## 2017-03-31 LAB — CBC
HEMATOCRIT: 28.8 % — AB (ref 36.0–46.0)
HEMOGLOBIN: 8.3 g/dL — AB (ref 12.0–15.0)
MCH: 27.9 pg (ref 26.0–34.0)
MCHC: 28.8 g/dL — AB (ref 30.0–36.0)
MCV: 96.6 fL (ref 78.0–100.0)
Platelets: 402 10*3/uL — ABNORMAL HIGH (ref 150–400)
RBC: 2.98 MIL/uL — ABNORMAL LOW (ref 3.87–5.11)
RDW: 17 % — AB (ref 11.5–15.5)
WBC: 15.9 10*3/uL — ABNORMAL HIGH (ref 4.0–10.5)

## 2017-03-31 LAB — GLUCOSE, CAPILLARY
GLUCOSE-CAPILLARY: 86 mg/dL (ref 65–99)
GLUCOSE-CAPILLARY: 84 mg/dL (ref 65–99)
GLUCOSE-CAPILLARY: 128 mg/dL — AB (ref 65–99)
Glucose-Capillary: 158 mg/dL — ABNORMAL HIGH (ref 65–99)

## 2017-03-31 LAB — BASIC METABOLIC PANEL
Anion gap: 19 — ABNORMAL HIGH (ref 5–15)
BUN: 78 mg/dL — AB (ref 6–20)
CALCIUM: 9.1 mg/dL (ref 8.9–10.3)
CHLORIDE: 96 mmol/L — AB (ref 101–111)
CO2: 24 mmol/L (ref 22–32)
CREATININE: 7.05 mg/dL — AB (ref 0.44–1.00)
GFR calc non Af Amer: 7 mL/min — ABNORMAL LOW (ref >60)
GFR, EST AFRICAN AMERICAN: 8 mL/min — AB (ref >60)
Glucose, Bld: 92 mg/dL (ref 65–99)
Potassium: 4.5 mmol/L (ref 3.5–5.1)
Sodium: 139 mmol/L (ref 135–145)

## 2017-03-31 LAB — TSH: TSH: 4.644 u[IU]/mL — ABNORMAL HIGH (ref 0.350–4.500)

## 2017-03-31 LAB — HEMOGLOBIN A1C
Hgb A1c MFr Bld: 5.8 % — ABNORMAL HIGH (ref 4.8–5.6)
MEAN PLASMA GLUCOSE: 120 mg/dL

## 2017-03-31 MED ORDER — LIDOCAINE-PRILOCAINE 2.5-2.5 % EX CREA
1.0000 "application " | TOPICAL_CREAM | CUTANEOUS | Status: DC | PRN
  Filled 2017-03-31: qty 5

## 2017-03-31 MED ORDER — LIDOCAINE HCL (PF) 1 % IJ SOLN: 5.0000 mL | INTRAMUSCULAR | Status: DC | PRN

## 2017-03-31 MED ORDER — HEPARIN SODIUM (PORCINE) 1000 UNIT/ML DIALYSIS: 1000.0000 [IU] | INTRAMUSCULAR | Status: DC | PRN

## 2017-03-31 MED ORDER — SODIUM CHLORIDE 0.9 % IV SOLN: 100.0000 mL | INTRAVENOUS | Status: DC | PRN

## 2017-03-31 MED ORDER — PENTAFLUOROPROP-TETRAFLUOROETH EX AERO: 1.0000 "application " | INHALATION_SPRAY | CUTANEOUS | Status: DC | PRN

## 2017-03-31 MED ORDER — PRO-STAT SUGAR FREE PO LIQD
30.0000 mL | Freq: Two times a day (BID) | ORAL | Status: DC
  Administered 2017-03-31 – 2017-04-06 (×11): 30 mL via ORAL
  Filled 2017-03-31 (×11): qty 30

## 2017-03-31 MED ORDER — NEPRO/CARBSTEADY PO LIQD
237.0000 mL | Freq: Two times a day (BID) | ORAL | Status: DC
  Administered 2017-04-01 – 2017-04-06 (×4): 237 mL via ORAL
  Filled 2017-03-31 (×17): qty 237

## 2017-03-31 MED ORDER — ALTEPLASE 2 MG IJ SOLR
2.0000 mg | Freq: Once | INTRAMUSCULAR | Status: DC | PRN
  Filled 2017-03-31: qty 2

## 2017-03-31 NOTE — Progress Notes (Signed)
Patient ID: [name], female   DOB: 10-Jun-1981, 36 y.o.   MRN: [id number] Patient is status post left above-knee amputation and has also developed a decubitus ulcer right heel.  Examination patient has central breakdown of the incision. There is no purulence no drainage no cellulitis no signs of abscess. Patient does have a wound which is approximately 2 cm in diameter and there is exposed femur. The remainder of the surgical incision is well healed. Examination of the right foot she does have a thin black eschar on the heel with a decubitus ulcer. There is no drainage no cellulitis the eschar seems very superficial.  Continue with the foam boot for the right leg and continue with the Mepilex dressing changes. Patient will require revision of the left above-knee amputation. I have spoken with her mother and we'll plan to proceed with revision surgery on Friday afternoon. Nothing by mouth after midnight Thursday. Discussed the importance with her mother regarding nutrition intake for wound healing.

## 2017-03-31 NOTE — Progress Notes (Signed)
CSW continuing to follow and assist with disposition  There continues to be no facility that can accept patient with current trach/dialysis needs and with [place] Medicaid  CSW will continue to follow  Burna Sis, LCSW Clinical Social Worker [phone]

## 2017-03-31 NOTE — Progress Notes (Signed)
Subjective:  Afeb- hemodynamically stable - still getting some IVF  Objective Vital signs in last 24 hours: Vitals:   03/31/17 0700 03/31/17 0720 03/31/17 0754 03/31/17 0758  BP: 105/69 105/69 (!) 120/93   Pulse: 99 100 (!) 102   Resp: 11 14 (!) 21   Temp:   98.2 F (36.8 C)   TempSrc:   Oral   SpO2: 95% 98% 94% 95%  Weight:      Height:       Weight change: -3 kg (-6 lb 9.8 oz)  Intake/Output Summary (Last 24 hours) at 03/31/17 9450 Last data filed at 03/31/17 3888  Gross per 24 hour  Intake          1965.83 ml  Output                0 ml  Net          1965.83 ml   Physical Exam: General: resting this AM- on just O2 via trach Heart: tachy Lungs: coarse BS bilat ant, no rales or wheezing Abdomen: obese, soft Extremities: R heel w/ new posterior ulcer, dry, no odor; L AKA with wound vac Dialysis Access: avf on left - good thrill and bruit   Dialysis:  [facility] TTS 4h 70mn   F200   114kg   2/2 bath  LUA AVF  Hep none Iron Sucrose (Venofer) 50 mg IVP During Dialysis 1X Week  Mircera 75 mcg IV q  2 weeks ( last on 02/05/17) Vitamin D (Calcitriol) Oral 0.75 mcg  Po q hd    Summary: Pt is a 36y.o. yo female who was admitted on 02/14/2017 with arrest- now with anoxic brain injury, trach and s/p L AKA this hosp   Assess: 1. Anoxic brain injury- has made some progress. Eating solids now, NG out. 2. Fever/ PNA /^WBC - on IV zosyn, wbc/ fevers improving- cx neg 3. Hypotension/ tachycardia - poss hypovolemia, giving IVF's cautiously w/ hx CM EF25%- 2 liters in- will stop IVF 4. ESRD - on TTS HD, started HD June '17. Unable to dialyze fully Saturday due to hypotension.  Reassess daily and will dialyze if BP will allow.  will plan for today 5. Anemia- on max aranesp- hgb dropping  6. MBD of CKD - Ca and Phos in range, on calc suspension. PTH 88 here.   7. HTN - soft BP's and tachy, no BP meds needed 8. PVD- s/p L AKA- wound vac- will need revision later this week  9. DM2 - per  primary  Kerith Sherley A  03/31/2017, 8:22 AM       Labs: Basic Metabolic Panel:  Recent Labs Lab 03/29/17 0421 03/30/17 0227 03/31/17 0258  NA 138 138 139  K 4.2 4.4 4.5  CL 94* 96* 96*  CO2 26 26 24   GLUCOSE 112* 84 92  BUN 60* 71* 78*  CREATININE 5.29* 6.19* 7.05*  CALCIUM 9.3 9.0 9.1   Liver Function Tests:  Recent Labs Lab 03/26/17 0352 03/28/17 0308  AST 33 23  ALT 20 15  ALKPHOS 355* 290*  BILITOT 1.5* 1.4*  PROT 8.3* 8.3*  ALBUMIN 3.4* 3.0*   No results for input(s): LIPASE, AMYLASE in the last 168 hours. No results for input(s): AMMONIA in the last 168 hours. CBC:  Recent Labs Lab 03/26/17 0352 03/28/17 0308 03/29/17 0421 03/30/17 0227 03/31/17 0258  WBC 23.7* 36.8* 21.5* 15.7* 15.9*  HGB 9.4* 9.3* 8.7* 8.1* 8.3*  HCT 33.0* 31.9* 30.4* 28.4* 28.8*  MCV  100.6* 99.1 98.7 96.9 96.6  PLT 453* 458* 387 387 402*   Cardiac Enzymes: No results for input(s): CKTOTAL, CKMB, CKMBINDEX, TROPONINI in the last 168 hours. CBG:  Recent Labs Lab 03/30/17 1612 03/30/17 2105 03/30/17 2359 03/31/17 0410 03/31/17 0757  GLUCAP 124* 125* 81 86 84    Iron Studies: No results for input(s): IRON, TIBC, TRANSFERRIN, FERRITIN in the last 72 hours. Studies/Results: Dg Chest Port 1 View  Result Date: 03/29/2017 CLINICAL DATA:  Follow-up pneumonia EXAM: PORTABLE CHEST 1 VIEW COMPARISON:  03/27/2017 FINDINGS: Cardiomegaly with vascular congestion. No overt edema. No effusions. Improving infiltrate in the right lung. IMPRESSION: Cardiomegaly with vascular congestion. Improving airspace opacity in the right lung. Electronically Signed   By: Rolm Baptise M.D.   On: 03/29/2017 11:02   Medications: Infusions: . sodium chloride 65 mL/hr at 03/31/17 0758  . piperacillin-tazobactam (ZOSYN)  IV Stopped (03/31/17 0116)    Scheduled Medications: . calcium carbonate (dosed in mg elemental calcium)  250 mg of elemental calcium Oral TID WC  . chlorhexidine  gluconate (MEDLINE KIT)  15 mL Mouth Rinse BID  . [START ON 04/02/2017] darbepoetin (ARANESP) injection - DIALYSIS  200 mcg Intravenous Q Thu-HD  . dextrose  1 ampule Intravenous Once  . [name]'s butt cream   Topical QID  . heparin  5,000 Units Subcutaneous Q8H  . insulin aspart  0-15 Units Subcutaneous Q4H  . insulin glargine  8 Units Subcutaneous Daily  . mouth rinse  15 mL Mouth Rinse QID  . multivitamin  1 tablet Oral QHS  . pantoprazole sodium  40 mg Per Tube Q24H  . saccharomyces boulardii  250 mg Oral BID    have reviewed scheduled and prn medications.

## 2017-03-31 NOTE — Progress Notes (Signed)
Nutrition Follow-up  DOCUMENTATION CODES:   Obesity unspecified  INTERVENTION:  Provide Nepro Shake po BID (thickened to nectar thick consistency), each supplement provides 425 kcal and 19 grams protein.  Provide 30 ml Prostat po BID, each supplement provides 100 kcal and 15 grams of protein.   Encourage adequate PO intake.  NUTRITION DIAGNOSIS:   Inadequate oral intake related to inability to eat as evidenced by NPO status; diet advanced; improving  GOAL:   Patient will meet greater than or equal to 90% of their needs; progressing  MONITOR:   PO intake, Supplement acceptance, Diet advancement, Labs, Weight trends, Skin, I & O's  REASON FOR ASSESSMENT:   Consult Enteral/tube feeding initiation and management  ASSESSMENT:   36 yo Female w/ ESRD d/t poorly controlled DM, bilateral transmetatarsal foot amputations, obesity, HTN, medical non-compliance as well as NICM. Admitted with complaints of left leg pain, found to have radiological evidence of osteomyelitis with wound nonhealing, dehiscence, purulent drainage from the wound. Admitted for IV antibiotics, hemodialysis and possible surgery. Pt with nonhealing L foot wound, amputation recommended.   5/19  cardiac arrest 5/31  trach placed 6/01  Cortrak placed 6/15  L AKA 6/22  Cortrak placed 6/28  NGT removed, TF discontinued  Pt is currently on a dysphagia 2 diet with nectar thick liquids. Meal completion has been 75% this AM. Family at bedside has been encouraging pt to eat at meals. RD to order nutritional supplements to aid in caloric and protein need as well as in wound healing.   Labs and medications reviewed.   Diet Order:  DIET DYS 2 Room service appropriate? Yes; Fluid consistency: Nectar Thick  Skin:  Wound (see comment) (Stage II anus, DTI R heel, L AKA stump)  Last BM:  7/2  Height:   Ht Readings from Last 1 Encounters:  02/14/17 5' 8.5" (1.74 m)    Weight:   Wt Readings from Last 1 Encounters:   03/31/17 171 lb 15.3 oz (78 kg)    Ideal Body Weight:  63.6 kg  BMI:  Body mass index is 25.77 kg/m.  Estimated Nutritional Needs:   Kcal:  2100-2300  Protein:  110-130 grams  Fluid:  Per MD  EDUCATION NEEDS:   No education needs identified at this time  Roslyn Smiling, MS, RD, LDN Pager # [phone] After hours/ weekend pager # [phone]

## 2017-03-31 NOTE — Progress Notes (Addendum)
Did not see or examine the patient today.    I spoke with the patient's sister, Wandra Mannan [phone]), for approximately 20 minutes.  Wandra Mannan is in [place].  She helps Myrene Buddy decide what decisions to make for Tameka.  We discussed the up coming AKA revision surgery planned for Friday.  We dicussed Scott's improvements and her declines. Specifically - (1) N/G is out and she is beginning to eat but has aspirated and needs to be able to eat enough to support her nutritional needs and heal wounds; (2) She is tolerating HD but needs to be able to regularly tolerate effective HD that filters the blood and removes fluid without becoming hypotensive and tachycardic; (3)  Variable healing of her wounds - now with a new black area on her right heal and needing a revision of her AKA.    We discussed that a long ICU/step down stay often leads to dwindling rather than improvement in strength.  Chanielle needs to get out of step down to get stronger.    Tameka asked what is coming next.    I described best case:  Kallista will eat enough to support her nutritional needs and not aspirate,  She will heal her wounds after coming thru the revision surgery Friday, and she will consistently tolerate effective HD.  If so the next step would be to tolerate HD sitting up in a chair for 4 hours.  I described worse case:  Bryleigh is at high risk for an acute event, but more likely, if she declines she will not eat enough to heal and she will slowly continue to dwindle.  Not be able to tolerate effective HD and likely aspirate again.  If 1 or more of those things happened she would be a candidate for [facility] / comfort care.  We discussed considering changing code status.    Tameka listened and asked appropriate questions.  She has my cell phone number to contact me.  I committed to continue to check on Honest and update Tameka intermittently.  Time 15 min.  Norvel Richards, PA-C Palliative Medicine Pager:  [phone]

## 2017-03-31 NOTE — Progress Notes (Signed)
[facility] PROGRESS NOTE  [name] NOB:[id number] DOB: 1981/03/05 DOA: 02/14/2017  PCP: Vicenta Aly, FNP  Brief History/Interval Summary: Patient is a 36 y.o. female with history of ESRD on HD, poorly controlled diabetes, history of bilateral transmetatarsal foot amputation with subsequent left foot wound dehiscence (refused BKA as outpatient), chronic systolic heart failure felt to be secondary to nonischemic cardiomyopathy admitted on 02/14/17 with wound dehiscence and persistent drainage from the left trans-metatarsal amputation site. She was [facility] to the [facility]. She was subsequently found to be unresponsive in her room on 5/19 after getting IV Dilaudid-she was  in asystole, CPR was started, with ROSC in around 9 minutes.She was then transferred to the intensive care unit, and subsequently underwent cooling and then rewarming. She was unable to be liberated off the ventilator for several weeks , and as a result underwent tracheostomy on 5/31. Patient has been evaluated by neurology during this hospital stay, per neurology chances of meaningful neurological recovery was dismal. Hospital course has now been complicated by persistent leukocytosis, respiratory failure, and wound dehiscence with gangrene causing persistent SIRS pathophysiology and now suspected aspiration PNA. 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 Since then the patient has been weaned off the vent and maintained on trach collar. Low blood pressures have been an issue , especially with respect to her need for dialysis. Palliative medicine is following.   Consultants:   Infectious disease:  Neurology  Gastroenterology  Wound care  PCCM  Nephrology  Orthopedics  Procedures:   Transthoracic echocardiogram 5/20 Study Conclusions  - Left ventricle: The cavity size was normal. Wall thickness was   increased in a pattern of mild LVH. Systolic function was   moderately to severely reduced. The  estimated ejection fraction   was in the range of 30% to 35%. Diffuse hypokinesis. There is   akinesis of the basal-midinferior myocardium. Features are   consistent with a pseudonormal left ventricular filling pattern,   with concomitant abnormal relaxation and increased filling   pressure (grade 2 diastolic dysfunction). - Mitral valve: There was moderate regurgitation. Valve area by   pressure half-time: 2.44 cm^2. - Left atrium: The atrium was mildly dilated. - Right ventricle: The cavity size was mildly dilated. Wall   thickness was normal. - Tricuspid valve: There was mild regurgitation. - Pulmonary arteries: Systolic pressure was mildly increased. PA   peak pressure: 43 mm Hg (S). - Pericardium, extracardiac: A moderate pericardial effusion was   identified circumferential to the heart. . Largest pocket of   fluid is posterior to right atrium (2cm). No evidence of   tamponade physiology.  Transthoracic echocardiogram 6/28 Study Conclusions  - Left ventricle: The cavity size was normal. There was mild   concentric hypertrophy. Systolic function was severely reduced.   The estimated ejection fraction was in the range of 25% to 30%.   Diffuse hypokinesis. Regional wall motion abnormalities cannot be   excluded. - Mitral valve: There was mild regurgitation. - Pericardium, extracardiac: A trivial pericardial effusion was   identified circumferential to the heart.  Left above-knee amputation. 6/15  Antibiotics: She has previously completed multiple courses of antibiotics. Was started back on ceftazidime 6/29. Changed over to Zosyn to cover anaerobic organisms on 6/30.  Subjective/Interval History: Patient remains nonverbal. Does not appear to be in any distress. Mother is at the bedside. Unable to do review of systems.  Objective:  Vital Signs  Vitals:   03/31/17 0500 03/31/17 0600 03/31/17 0700 03/31/17 0720  BP: Marland Kitchen)  126/93 125/89 105/69 105/69  Pulse: (!) 106  99  100  Resp: 20 (!) 22 11 14   Temp:      TempSrc:      SpO2: 95%  95% 98%  Weight: 78 kg (171 lb 15.3 oz)     Height:        Intake/Output Summary (Last 24 hours) at 03/31/17 0750 Last data filed at 03/31/17 0500  Gross per 24 hour  Intake             1773 ml  Output                0 ml  Net             1773 ml   Filed Weights   03/29/17 0557 03/30/17 0333 03/31/17 0500  Weight: 80.4 kg (177 lb 4 oz) 81 kg (178 lb 9.2 oz) 78 kg (171 lb 15.3 oz)   Telemetry: Occasional sinus tachycardia.   General appearance: Awake. Distracted. Does not appear to be in any distress.  Resp: Improved air entry overall. Coarse breath sounds. Few crackles at the bases. No wheezing or rhonchi. Cardio: S1, S2 is normal, regular. No S3, S4. No rubs, murmurs or bruit GI: Abdomen is soft, nontender, nondistended. Bowel sounds are present. No masses or organomegaly Extremities: Left AKA. Wound dehiscence has been noted with exposed bone. Black area noted in the right heel. No erythema. No drainage. Neurologic: Awake, alert. Does not speak. Moving all her extremities.  Lab Results:  Data Reviewed: I have personally reviewed following labs and imaging studies  CBC:  Recent Labs Lab 03/26/17 0352 03/28/17 0308 03/29/17 0421 03/30/17 0227 03/31/17 0258  WBC 23.7* 36.8* 21.5* 15.7* 15.9*  HGB 9.4* 9.3* 8.7* 8.1* 8.3*  HCT 33.0* 31.9* 30.4* 28.4* 28.8*  MCV 100.6* 99.1 98.7 96.9 96.6  PLT 453* 458* 387 387 402*    Basic Metabolic Panel:  Recent Labs Lab 03/26/17 0352 03/28/17 0308 03/29/17 0421 03/30/17 0227 03/31/17 0258  NA 138 144 138 138 139  K 4.3 5.0 4.2 4.4 4.5  CL 95* 99* 94* 96* 96*  CO2 27 27 26 26 24   GLUCOSE 182* 171* 112* 84 92  BUN 46* 91* 60* 71* 78*  CREATININE 3.28* 7.38* 5.29* 6.19* 7.05*  CALCIUM 9.6 10.5* 9.3 9.0 9.1    GFR: Estimated Creatinine Clearance: 11.2 mL/min (A) (by C-G formula based on SCr of 7.05 mg/dL (H)).  Liver Function Tests:  Recent Labs Lab  03/24/17 0800 03/26/17 0352 03/28/17 0308  AST 34 33 23  ALT 17 20 15   ALKPHOS 350* 355* 290*  BILITOT 1.5* 1.5* 1.4*  PROT 8.5* 8.3* 8.3*  ALBUMIN 3.9 3.4* 3.0*     CBG:  Recent Labs Lab 03/30/17 1157 03/30/17 1612 03/30/17 2105 03/30/17 2359 03/31/17 0410  GLUCAP 133* 124* 125* 81 86     Recent Results (from the past 240 hour(s))  Culture, blood (Routine X 2) w Reflex to ID Panel     Status: None   Collection Time: 03/23/17  2:06 PM  Result Value Ref Range Status   Specimen Description BLOOD RIGHT WRIST  Final   Special Requests   Final    BOTTLES DRAWN AEROBIC AND ANAEROBIC Blood Culture adequate volume   Culture NO GROWTH 5 DAYS  Final   Report Status 03/28/2017 FINAL  Final  Culture, blood (Routine X 2) w Reflex to ID Panel     Status: None   Collection Time: 03/23/17  2:06 PM  Result Value Ref Range Status   Specimen Description BLOOD RIGHT ARM  Final   Special Requests   Final    BOTTLES DRAWN AEROBIC ONLY Blood Culture adequate volume   Culture NO GROWTH 5 DAYS  Final   Report Status 03/28/2017 FINAL  Final  Culture, blood (routine x 2)     Status: None (Preliminary result)   Collection Time: 03/26/17  2:31 PM  Result Value Ref Range Status   Specimen Description BLOOD LEFT ARM  Final   Special Requests IN PEDIATRIC BOTTLE Blood Culture adequate volume  Final   Culture NO GROWTH 4 DAYS  Final   Report Status PENDING  Incomplete  Culture, blood (routine x 2)     Status: None (Preliminary result)   Collection Time: 03/26/17  2:31 PM  Result Value Ref Range Status   Specimen Description BLOOD RIGHT HAND  Final   Special Requests IN PEDIATRIC BOTTLE Blood Culture adequate volume  Final   Culture NO GROWTH 4 DAYS  Final   Report Status PENDING  Incomplete  MRSA PCR Screening     Status: None   Collection Time: 03/27/17  8:09 AM  Result Value Ref Range Status   MRSA by PCR NEGATIVE NEGATIVE Final    Comment:        The GeneXpert MRSA Assay  (FDA approved for NASAL specimens only), is one component of a comprehensive MRSA colonization surveillance program. It is not intended to diagnose MRSA infection nor to guide or monitor treatment for MRSA infections.       Radiology Studies: Dg Chest Port 1 View  Result Date: 03/29/2017 CLINICAL DATA:  Follow-up pneumonia EXAM: PORTABLE CHEST 1 VIEW COMPARISON:  03/27/2017 FINDINGS: Cardiomegaly with vascular congestion. No overt edema. No effusions. Improving infiltrate in the right lung. IMPRESSION: Cardiomegaly with vascular congestion. Improving airspace opacity in the right lung. Electronically Signed   By: Rolm Baptise M.D.   On: 03/29/2017 11:02     Medications:  Scheduled: . calcium carbonate (dosed in mg elemental calcium)  250 mg of elemental calcium Oral TID WC  . chlorhexidine gluconate (MEDLINE KIT)  15 mL Mouth Rinse BID  . [START ON 04/02/2017] darbepoetin (ARANESP) injection - DIALYSIS  200 mcg Intravenous Q Thu-HD  . dextrose  1 ampule Intravenous Once  . [name]'s butt cream   Topical QID  . heparin  5,000 Units Subcutaneous Q8H  . insulin aspart  0-15 Units Subcutaneous Q4H  . insulin glargine  8 Units Subcutaneous Daily  . mouth rinse  15 mL Mouth Rinse QID  . multivitamin  1 tablet Oral QHS  . pantoprazole sodium  40 mg Per Tube Q24H  . saccharomyces boulardii  250 mg Oral BID   Continuous: . sodium chloride 65 mL/hr at 03/31/17 0501  . 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)   Normocytic anemia   Hyperlipidemia   Hypertension   Non-ischemic cardiomyopathy - by echo 8/16- EF 35-40%   Morbid obesity-     Acute combined systolic and diastolic heart failure (HCC)   ESRD (end stage renal disease) ([place])   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)   Diabetic osteomyelitis ([place])   Dehiscence of amputation stump ([place])  Osteomyelitis (HCC)   Chronic pain syndrome   Cardiac arrest, cause unspecified (HCC)   Pressure injury of skin   Wound dehiscence   Coma (HCC)   Elevated liver enzymes   Jaundice   Encounter for central line placement   Protein calorie malnutrition ([place])   DNR (do not resuscitate) discussion   Palliative care by specialist   Tachycardia   Goals of care, counseling/discussion   Anoxic brain injury [facility])   Palliative care encounter   Tracheostomy status ([place])   Abscess of left leg   Hypoxemia   Fever   Aspiration into airway    Acute /chronic hypoxemic respiratory failure in a setting of cardiac arrest Last ventilator use was on night of 6/19-6/20. On TC for several days. PCCM following for resp issues -tracheostomy in place. Change to #4 cuffless trach. S/P MBS 6/23. Now status post FEES 6/27. Patient started on nectar thick liquids and dysphagia 1 diet 6/27. Holding off on PEG for now. Cortrak removed 6/28. Unfortunately patient has aspirated again. She was febrile and tachypneic. Repeat chest x-ray on 6/29 showed new right-sided infiltrate. She was started on [place] on 6/29 and changed over to Zosyn to provide anaerobic coverage. Seems to be stable. Chest x-ray from 7/1 showed improving opacity in the right lung.  Systemic inflammatory response syndrome/Persistent leukocytosis/aspiration pneumonia Patient developed recurrent aspiration on 6/28-29. She was started back on antibiotics. She is improving. Heart rate is improved. Continue Zosyn for now. Initial infections were due to left foot gangrene and abscess. Now, she status post AKA. Blood cultures have been done multiple times and have been negative so far. She has had C. difficile testing, which was also been  negative. CT scan did not show any PE. She's completed multiple courses of antibiotics. Most recently started on Fortaz on 6/29 and changed to Zosyn on 6/30.   Tachycardia Heart rate has improved. Patient was in persistent sinus tachycardia for many days. Could've been due to fever and infection. It's also possible that she might have been volume depleted. She was given IV fluids. She was started on antibiotics. Patient did not tolerate being on beta blocker with significant drop in blood pressures. Some of this could also be due to her cardiomyopathy and could also be contributing to her cardiomyopathy. TSH 4.6.  ESRD Low blood pressure has impeded dialysis. Nephrology is following closely. If blood pressure continues to create challenges for dialysis then further discussions will need to be held with family. Patient apparently not a candidate for CRRT. Pressors, to keep BP up, are not a good as it will not provide a long-term solution.  Left trans-metatarsal stump wound dehiscence with infection and gangrene Per operative note-pus/abscess noted extending up to the knee when BKA was attempted, subsequently underwent a AKA. Note, initially family was very reluctant to proceed with amputation, but given persistent leukocytosis/fever and potential risk of sepsis, family agreed for amputation, subsequently AKA was done on 6/15. Orthopedics removed wound VAC 6/20. Seen by orthopedics. The plan is for revision of her left AKA. Wound care nurse following for the pressure wound on the right heel.  Anoxic brain injury secondary to Cardiac arrest on 5/19 Patient has shown some cognitive improvement over the last manydays. She has been noted to follow certain commands at times. Her prognosis remains guarded. She has been seen by neurology during this hospitalization.    Diarrhea, continues to have loose stools Continue probiotics and Imodium as needed. Try to discontinue Flexiseal.  Normocytic  anemia Likely secondary to chronic disease-probably worsened by acute illness. No signs of bleeding, hemoglobin has been stable with some fluctuations.  Diabetes mellitus type 2  Continue with SSI. On Lantus. HbA1c 5.9.  Chronic systolic heart failure/nonischemic cardiomyopathy Volume was being managed with hemodialysis. Patient was placed on beta blocker, but she has not been able to due to low blood pressure. Coreg has been discontinued. Echocardiogram reports as above. EF 25-30% per last echocardiogram.  Pericardial effusion Seen on TEE-with no evidence of tamponade pathophysiology. Repeat echo 6/26 with no significant pericardial effusion.  Moderate protein calorie malnutrition Started orals 6/27. Aspiration precautions.   Goals of care Prognosis is guarded. Palliative medicine is following and assisting with management. Further discussions will need to be held if the patient is unable to undergo dialysis due to low blood pressures.   DVT Prophylaxis: Subcutaneous heparin    Code Status: Full code  Family Communication: Discussed with the mother  Disposition Plan: Management as outlined above.    LOS: 90 days   [facility] Pager [phone] 03/31/2017, 7:50 AM  If 7PM-7AM, please contact night-coverage at [email or web address], password [facility]

## 2017-03-31 NOTE — Progress Notes (Signed)
  Speech Language Pathology Treatment: Dysphagia;Cognitive-Linquistic  Patient Details Name: [name] MRN: [id number] DOB: 05/11/1981 Today's Date: 03/31/2017 Time: 0900-0930 SLP Time Calculation (min) (ACUTE ONLY): 30 min  Assessment / Plan / Recommendation Clinical Impression  Pt making significant improvement today in sustained attention with verbal and functional tasks. Pt making some requests for basic needs without cueing with PMSV in place. She is conversing at phrase level with moderate cues to increased volume. Able to problem solve self feeding with moderate tactile cues needed, mostly due to weakness of extremities. Pt is toelrating nectar thick liquids, but needs assist for small sips as she can be a little impulsive with cup. Pt also able to masticate soft solids. Will uprgrade diet to dys 2 (fine chop). Orientation to time and situation rapidly improving. Provided model to mother about discussion regarding time and plan for the day, goals  and activities to look forward to (requesting a bed pan, calling her family members with loud vocal quality). Recommend OT consult.    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 ETT 5/19 and trach'd 5/31. Hospital course included cardiac arrest, transfer to ICU      SLP Plan  Continue with current plan of care       Recommendations  Diet recommendations: Dysphagia 2 (fine chop);Nectar-thick liquid Liquids provided via: Cup Medication Administration: Crushed with puree Supervision: Full supervision/cueing for compensatory strategies Compensations: Slow rate;Small sips/bites Postural Changes and/or Swallow Maneuvers: Seated upright 90 degrees      Patient may use Passy-Muir Speech Valve: During all waking hours (remove during sleep);During PO intake/meals;Caregiver trained to provide supervision;During all therapies with supervision PMSV Supervision: Full         Plan: Continue with current plan of care       GO                [name], [name] 03/31/2017, 9:31 AM

## 2017-04-01 DIAGNOSIS — G894 Chronic pain syndrome: Secondary | ICD-10-CM

## 2017-04-01 LAB — CBC
HEMATOCRIT: 30.8 % — AB (ref 36.0–46.0)
HEMOGLOBIN: 8.6 g/dL — AB (ref 12.0–15.0)
MCH: 27.3 pg (ref 26.0–34.0)
MCHC: 27.9 g/dL — ABNORMAL LOW (ref 30.0–36.0)
MCV: 97.8 fL (ref 78.0–100.0)
Platelets: 401 10*3/uL — ABNORMAL HIGH (ref 150–400)
RBC: 3.15 MIL/uL — ABNORMAL LOW (ref 3.87–5.11)
RDW: 17.6 % — AB (ref 11.5–15.5)
WBC: 16 10*3/uL — AB (ref 4.0–10.5)

## 2017-04-01 LAB — GLUCOSE, CAPILLARY
GLUCOSE-CAPILLARY: 106 mg/dL — AB (ref 65–99)
GLUCOSE-CAPILLARY: 120 mg/dL — AB (ref 65–99)
GLUCOSE-CAPILLARY: 163 mg/dL — AB (ref 65–99)
GLUCOSE-CAPILLARY: 88 mg/dL (ref 65–99)
GLUCOSE-CAPILLARY: 95 mg/dL (ref 65–99)
Glucose-Capillary: 116 mg/dL — ABNORMAL HIGH (ref 65–99)
Glucose-Capillary: 133 mg/dL — ABNORMAL HIGH (ref 65–99)
Glucose-Capillary: 187 mg/dL — ABNORMAL HIGH (ref 65–99)

## 2017-04-01 LAB — BASIC METABOLIC PANEL
ANION GAP: 13 (ref 5–15)
BUN: 19 mg/dL (ref 6–20)
CALCIUM: 8.9 mg/dL (ref 8.9–10.3)
CHLORIDE: 96 mmol/L — AB (ref 101–111)
CO2: 29 mmol/L (ref 22–32)
Creatinine, Ser: 3.1 mg/dL — ABNORMAL HIGH (ref 0.44–1.00)
GFR calc non Af Amer: 18 mL/min — ABNORMAL LOW (ref >60)
GFR, EST AFRICAN AMERICAN: 21 mL/min — AB (ref >60)
Glucose, Bld: 94 mg/dL (ref 65–99)
Potassium: 3.4 mmol/L — ABNORMAL LOW (ref 3.5–5.1)
Sodium: 138 mmol/L (ref 135–145)

## 2017-04-01 NOTE — Progress Notes (Signed)
Physical Therapy Treatment Patient Details Name: [name] MRN: [id number] DOB: 1981-02-27 Today's Date: 04/01/2017    History of Present Illness [name] is a 36 y.o. 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 ETT 5/19 and trach'd 5/31.     PT Comments    Pt admitted with above diagnosis. Pt currently with functional limitations due to balance and endurance deficits listed below (see PT Problem List). Pt BP dropped and pt dizzy per report.  BP 121/81 initially and down to 95/67.  Pt lying down due to dizziness.  Nursing made aware. HR also elevated with sitting from 107-130 bpm.  Sats on 30% trach collar >90%.   Spent incr time positioning pt.  Pt family present and was very attentive.  Nurse Asked PT to leave off wrist restraints and family aware to call if they are leaving room.  Pt will benefit from skilled PT to increase their independence and safety with mobility to allow discharge to the venue listed below.     Follow Up Recommendations  SNF;Supervision/Assistance - 24 hour     Equipment Recommendations  Other (comment) (TBD)    Recommendations for Other Services       Precautions / Restrictions Precautions Precautions: Fall Restrictions Weight Bearing Restrictions: Yes RLE Weight Bearing: Non weight bearing LLE Weight Bearing: Non weight bearing    Mobility  Bed Mobility Overal bed mobility: Needs Assistance Bed Mobility: Rolling;Sidelying to Sit;Sit to Supine Rolling: Mod assist Sidelying to sit: Max assist;+2 for safety/equipment   Sit to supine: +2 for physical assistance;Max assist   General bed mobility comments: pt able to grasp rail and pull trunk to side, mod A for managment of lower body during roll.  Max A at LE's and trunk for SL to sit as well as return to supine  Transfers                 General transfer comment: unable at this point can  use lift for OOB to chair.   Ambulation/Gait                 Stairs            Wheelchair Mobility    Modified Rankin (Stroke Patients Only)       Balance Overall balance assessment: Needs assistance Sitting-balance support: Bilateral upper extremity supported Sitting balance-Leahy Scale: Poor Sitting balance - Comments: mod to max  A for maintaining sitting x5 mins. Frequent R lean with inability to correct. Pt had diffciulty maintaining head control. Pt bobbing head back and forth and would have a blank stare and would not respond when asked questions. BP dropped 121/ 81 to 95/67.  Pt attempting to lie down therefore assisted her back to supine.  Asked pt if she was dizzy and pt shook head yes.  Notified nursing of drop in BP.   Postural control: Right lateral lean;Posterior lean                                  Cognition Arousal/Alertness: Lethargic Behavior During Therapy: WFL for tasks assessed/performed;Flat affect Overall Cognitive Status: Impaired/Different from baseline Area of Impairment:  Attention;Following commands;Awareness;Problem solving                   Current Attention Level: Focused   Following Commands: Follows one step commands inconsistently;Follows one step commands with increased time   Awareness: Intellectual Problem Solving: Slow processing;Decreased initiation;Requires verbal cues;Requires tactile cues General Comments: all processing very slow, pt childlike and limited attempt at verbalization with trach      Exercises General Exercises - Lower Extremity Heel Slides: AAROM;Both;10 reps;Supine    General Comments        Pertinent Vitals/Pain Pain Assessment: Faces Faces Pain Scale: Hurts even more Pain Location: R foot to touch Pain Descriptors /  Indicators: Burning Pain Intervention(s): Limited activity within patient's tolerance;Monitored during session;Repositioned    Home Living                      Prior Function            PT Goals (current goals can now be found in the care plan section) Progress towards PT goals: Not progressing toward goals - comment (BP drops when pt sitting up - orthostasis)    Frequency    Min 3X/week      PT Plan Current plan remains appropriate    Co-evaluation              AM-PAC PT "6 Clicks" Daily Activity  Outcome Measure  Difficulty turning over in bed (including adjusting bedclothes, sheets and blankets)?: Total Difficulty moving from lying on back to sitting on the side of the bed? : Total Difficulty sitting down on and standing up from a chair with arms (e.g., wheelchair, bedside commode, etc,.)?: Total Help needed moving to and from a bed to chair (including a wheelchair)?: Total Help needed walking in hospital room?: Total Help needed climbing 3-5 steps with a railing? : Total 6 Click Score: 6    End of Session Equipment Utilized During Treatment: Oxygen (trach in place at 30%) Activity Tolerance: Patient limited by fatigue Patient left: in bed;with call bell/phone within reach;with family/visitor present Nurse Communication: Mobility status;Need for lift equipment PT Visit Diagnosis: Muscle weakness (generalized) (M62.81);Pain;Other abnormalities of gait and mobility (R26.89) Pain - Right/Left: Right Pain - part of body: Ankle and joints of foot     Time: 9147-8295 PT Time Calculation (min) (ACUTE ONLY): 27 min  Charges:  $Therapeutic Activity: 23-37 mins                    G Codes:       [name],PT Acute Rehabilitation [phone] (pager)    [name] 04/01/2017, 12:30 PM

## 2017-04-01 NOTE — Progress Notes (Signed)
Changed water bottle and trach set up

## 2017-04-01 NOTE — Progress Notes (Signed)
Patient pulled trach out a second time. RT reinserted trach. Positioned confirmed with etco2 detector and auscultation. RN placed pt in restraints. Vitals are stable at this time. RT will monitor as needed.

## 2017-04-01 NOTE — Progress Notes (Signed)
RN called to room by patient's mother at bedside, patient had pulled out trach, VSS and in no apparent distress, RT was called and reinserted trach.

## 2017-04-01 NOTE — Progress Notes (Signed)
[facility] PROGRESS NOTE  [name] ASN:[id number] DOB: August 26, 1981 DOA: 02/14/2017  PCP: Vicenta Aly, FNP  Brief History/Interval Summary: Patient is a 36 y.o. female with history of ESRD on HD, poorly controlled diabetes, history of bilateral transmetatarsal foot amputation with subsequent left foot wound dehiscence (refused BKA as outpatient), chronic systolic heart failure felt to be secondary to nonischemic cardiomyopathy admitted on 02/14/17 with wound dehiscence and persistent drainage from the left trans-metatarsal amputation site. She [facility]. She was subsequently found to be unresponsive in her room on 5/19 after getting IV Dilaudid-she was  in asystole, CPR was started, with ROSC in around 9 minutes.She was then transferred to the intensive care unit, and subsequently underwent cooling and then rewarming. She was unable to be liberated off the ventilator for several weeks , and as a result underwent tracheostomy on 5/31. Patient has been evaluated by neurology during this hospital stay, per neurology chances of meaningful neurological recovery was dismal. Hospital course has now been complicated by persistent leukocytosis, respiratory failure, and wound dehiscence with gangrene causing persistent SIRS pathophysiology and now suspected aspiration PNA. 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 Since then the patient has been weaned off the vent and maintained on trach collar. Low blood pressures have been an issue , especially with respect to her need for dialysis. Palliative medicine is following.   Consultants:   Infectious disease:  Neurology  Gastroenterology  Wound care  PCCM  Nephrology  Orthopedics  Procedures:   Transthoracic echocardiogram 5/20 Study Conclusions  - Left ventricle: The cavity size was normal. Wall thickness was   increased in a pattern of mild LVH. Systolic function was   moderately to severely reduced. The  estimated ejection fraction   was in the range of 30% to 35%. Diffuse hypokinesis. There is   akinesis of the basal-midinferior myocardium. Features are   consistent with a pseudonormal left ventricular filling pattern,   with concomitant abnormal relaxation and increased filling   pressure (grade 2 diastolic dysfunction). - Mitral valve: There was moderate regurgitation. Valve area by   pressure half-time: 2.44 cm^2. - Left atrium: The atrium was mildly dilated. - Right ventricle: The cavity size was mildly dilated. Wall   thickness was normal. - Tricuspid valve: There was mild regurgitation. - Pulmonary arteries: Systolic pressure was mildly increased. PA   peak pressure: 43 mm Hg (S). - Pericardium, extracardiac: A moderate pericardial effusion was   identified circumferential to the heart. . Largest pocket of   fluid is posterior to right atrium (2cm). No evidence of   tamponade physiology.  Transthoracic echocardiogram 6/28 Study Conclusions  - Left ventricle: The cavity size was normal. There was mild   concentric hypertrophy. Systolic function was severely reduced.   The estimated ejection fraction was in the range of 25% to 30%.   Diffuse hypokinesis. Regional wall motion abnormalities cannot be   excluded. - Mitral valve: There was mild regurgitation. - Pericardium, extracardiac: A trivial pericardial effusion was   identified circumferential to the heart.  Left above-knee amputation. 6/15  Antibiotics: She has previously completed multiple courses of antibiotics. Was started back on ceftazidime 6/29. Changed over to Zosyn to cover anaerobic organisms on 6/30.  Subjective/Interval History: Some improvement has been documented, the patient is becoming more alert and more responsive, overnight she was a little bit agitated and she took her trach tube out, family at bedside discussed about the case.  Objective:  Vital Signs  Vitals:  04/01/17 0709 04/01/17 0800  04/01/17 0900 04/01/17 1139  BP: 103/65 104/74 121/81 99/65  Pulse: (!) 109 (!) 108 (!) 108 (!) 108  Resp: (!) 22 (!) 27 (!) 21 (!) 23  Temp:      TempSrc:      SpO2: 98% 90% 99% 97%  Weight:      Height:        Intake/Output Summary (Last 24 hours) at 04/01/17 1240 Last data filed at 04/01/17 1000  Gross per 24 hour  Intake           538.08 ml  Output              563 ml  Net           -24.92 ml   Filed Weights   03/31/17 1430 03/31/17 1845 04/01/17 0426  Weight: 81 kg (178 lb 9.2 oz) 80 kg (176 lb 5.9 oz) 78 kg (171 lb 15.3 oz)   Telemetry: Occasional sinus tachycardia.   General appearance: sleeping , aroused easily confused . Resp: CTAB , NO CRACKLES , no wheezes. Cardio: S1, S2 is normal, regular. No S3, S4. No rubs, murmurs or bruit GI: Abdomen is soft, nontender, nondistended. Bowel sounds are present. No masses or organomegaly Extremities: Left AKA. Wound dehiscence has been noted with exposed bone. Black area noted in the right heel. No erythema. No drainage. Neurologic: Awake, alert. Does not speak. Moving all her extremities.  Lab Results:  Data Reviewed: I have personally reviewed following labs and imaging studies  CBC:  Recent Labs Lab 03/28/17 0308 03/29/17 0421 03/30/17 0227 03/31/17 0258 04/01/17 0241  WBC 36.8* 21.5* 15.7* 15.9* 16.0*  HGB 9.3* 8.7* 8.1* 8.3* 8.6*  HCT 31.9* 30.4* 28.4* 28.8* 30.8*  MCV 99.1 98.7 96.9 96.6 97.8  PLT 458* 387 387 402* 401*    Basic Metabolic Panel:  Recent Labs Lab 03/28/17 0308 03/29/17 0421 03/30/17 0227 03/31/17 0258 04/01/17 0241  NA 144 138 138 139 138  K 5.0 4.2 4.4 4.5 3.4*  CL 99* 94* 96* 96* 96*  CO2 27 26 26 24 29   GLUCOSE 171* 112* 84 92 94  BUN 91* 60* 71* 78* 19  CREATININE 7.38* 5.29* 6.19* 7.05* 3.10*  CALCIUM 10.5* 9.3 9.0 9.1 8.9    GFR: Estimated Creatinine Clearance: 25.5 mL/min (A) (by C-G formula based on SCr of 3.1 mg/dL (H)).  Liver Function Tests:  Recent Labs Lab  03/26/17 0352 03/28/17 0308  AST 33 23  ALT 20 15  ALKPHOS 355* 290*  BILITOT 1.5* 1.4*  PROT 8.3* 8.3*  ALBUMIN 3.4* 3.0*     CBG:  Recent Labs Lab 03/31/17 1153 03/31/17 2202 04/01/17 0058 04/01/17 0404 04/01/17 0752  GLUCAP 128* 158* 120* 95 88     Recent Results (from the past 240 hour(s))  Culture, blood (Routine X 2) w Reflex to ID Panel     Status: None   Collection Time: 03/23/17  2:06 PM  Result Value Ref Range Status   Specimen Description BLOOD RIGHT WRIST  Final   Special Requests   Final    BOTTLES DRAWN AEROBIC AND ANAEROBIC Blood Culture adequate volume   Culture NO GROWTH 5 DAYS  Final   Report Status 03/28/2017 FINAL  Final  Culture, blood (Routine X 2) w Reflex to ID Panel     Status: None   Collection Time: 03/23/17  2:06 PM  Result Value Ref Range Status   Specimen Description BLOOD RIGHT ARM  Final  Special Requests   Final    BOTTLES DRAWN AEROBIC ONLY Blood Culture adequate volume   Culture NO GROWTH 5 DAYS  Final   Report Status 03/28/2017 FINAL  Final  Culture, blood (routine x 2)     Status: None   Collection Time: 03/26/17  2:31 PM  Result Value Ref Range Status   Specimen Description BLOOD LEFT ARM  Final   Special Requests IN PEDIATRIC BOTTLE Blood Culture adequate volume  Final   Culture NO GROWTH 5 DAYS  Final   Report Status 03/31/2017 FINAL  Final  Culture, blood (routine x 2)     Status: None   Collection Time: 03/26/17  2:31 PM  Result Value Ref Range Status   Specimen Description BLOOD RIGHT HAND  Final   Special Requests IN PEDIATRIC BOTTLE Blood Culture adequate volume  Final   Culture NO GROWTH 5 DAYS  Final   Report Status 03/31/2017 FINAL  Final  MRSA PCR Screening     Status: None   Collection Time: 03/27/17  8:09 AM  Result Value Ref Range Status   MRSA by PCR NEGATIVE NEGATIVE Final    Comment:        The GeneXpert MRSA Assay (FDA approved for NASAL specimens only), is one component of a comprehensive MRSA  colonization surveillance program. It is not intended to diagnose MRSA infection nor to guide or monitor treatment for MRSA infections.       Radiology Studies: No results found.   Medications:  Scheduled: . calcium carbonate (dosed in mg elemental calcium)  250 mg of elemental calcium Oral TID WC  . chlorhexidine gluconate (MEDLINE KIT)  15 mL Mouth Rinse BID  . [START ON 04/02/2017] darbepoetin (ARANESP) injection - DIALYSIS  200 mcg Intravenous Q Thu-HD  . dextrose  1 ampule Intravenous Once  . feeding supplement (NEPRO CARB STEADY)  237 mL Oral BID BM  . feeding supplement (PRO-STAT SUGAR FREE 64)  30 mL Oral BID  . [name]'s butt cream   Topical QID  . heparin  5,000 Units Subcutaneous Q8H  . insulin aspart  0-15 Units Subcutaneous Q4H  . insulin glargine  8 Units Subcutaneous Daily  . mouth rinse  15 mL Mouth Rinse QID  . multivitamin  1 tablet Oral QHS  . pantoprazole sodium  40 mg Per Tube Q24H  . saccharomyces boulardii  250 mg Oral BID   Continuous: . sodium chloride 10 mL (03/31/17 0901)  . sodium chloride    . sodium chloride    . 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)   Normocytic anemia   Hyperlipidemia   Hypertension   Non-ischemic cardiomyopathy - by echo 8/16- EF 35-40%   Morbid obesity-    Acute combined systolic and diastolic heart failure (HCC)   ESRD (end stage renal disease) ([place])   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)   Diabetic osteomyelitis ([place])   Dehiscence  of amputation stump (HCC)   Osteomyelitis (HCC)   Chronic pain syndrome   Cardiac arrest, cause unspecified ([place])   Pressure injury of skin   Wound dehiscence   Coma ([place])   Elevated liver enzymes  Jaundice   Encounter for central line placement   Protein calorie malnutrition ([place])   DNR (do not resuscitate) discussion   Palliative care by specialist   Tachycardia   Goals of care, counseling/discussion   Anoxic brain injury [facility])   Palliative care encounter   Tracheostomy status ([place])   Abscess of left leg   Hypoxemia   Fever   Aspiration into airway    Acute /chronic hypoxemic respiratory failure in a setting of cardiac arrest Improving . Last ventilator use was on night of 6/19-6/20. On TC for several days. PCCM following for resp issues -tracheostomy in place. Change to #4 cuffless trach. S/P MBS 6/23. Now status post FEES 6/27. Patient started on nectar thick liquids and dysphagia 1 diet 6/27. Holding off on PEG for now. Cortrak removed 6/28. Unfortunately patient has aspirated again. She was febrile and tachypneic. Repeat chest x-ray on 6/29 showed new right-sided infiltrate. She was started on [place] on 6/29 and changed over to Zosyn to provide anaerobic coverage. Seems to be stable. Chest x-ray from 7/1 showed improving opacity in the right lung.  Systemic inflammatory response syndrome/Persistent leukocytosis/aspiration pneumonia Improving . Patient developed recurrent aspiration on 6/28-29. She was started back on antibiotics. She is improving. Heart rate is improved. Continue Zosyn for now. Initial infections were due to left foot gangrene and abscess. Now, she status post AKA. Blood cultures have been done multiple times and have been negative so far. She has had C. difficile testing, which was also been negative. CT scan did not show any PE. She's completed multiple courses of antibiotics. Most recently started on Fortaz on 6/29 and changed to Zosyn on 6/30.     ESRD On HD per Nephrology since June 17 , TTS , Tolerating it well, continue to follow up with Nephrology .  Left trans-metatarsal stump wound dehiscence with infection and gangrene Per operative note-pus/abscess noted extending up to the knee when BKA was attempted, subsequently underwent a AKA. Note, initially family was very reluctant to proceed with amputation, but given persistent leukocytosis/fever and potential risk of sepsis, family agreed for amputation, subsequently AKA was done on 6/15. Orthopedics removed wound VAC 6/20. Seen by orthopedics. The plan is for revision of her left AKA hopefully later this week . Wound care nurse following for the pressure wound on the right heel.  Anoxic brain injury secondary to Cardiac arrest on 5/19 Patient has shown some cognitive improvement over the last manydays. She has been noted to follow certain commands at times. Her prognosis remains guarded. She has been seen by neurology during this hospitalization.    Diarrhea, continues to have loose stools Continue probiotics and Imodium as needed. Try to discontinue Flexiseal.  Normocytic anemia Likely secondary to chronic disease-probably worsened by acute illness. No signs of bleeding, hemoglobin has been stable with some fluctuations.  Diabetes mellitus type 2  Continue with SSI. On Lantus. HbA1c 5.9.  Chronic systolic heart failure/nonischemic cardiomyopathy Volume was being managed with hemodialysis. Patient was placed on beta blocker, but she has not been able to due to low blood pressure. Coreg has been discontinued. Echocardiogram reports as above. EF 25-30% per last echocardiogram.  Pericardial effusion Seen on TEE-with no evidence of tamponade pathophysiology. Repeat echo 6/26 with no significant pericardial effusion.  Moderate protein calorie malnutrition Started orals 6/27. Aspiration precautions.   Goals of care Prognosis is guarded. Palliative medicine is following  and assisting with management.    DVT Prophylaxis: Subcutaneous heparin    Code Status:  Full code  Family Communication: Discussed with the mother  Disposition Plan: Management as outlined above.    Waldron Session  [facility] Pager [phone] 04/01/2017, 12:40 PM  If 7PM-7AM, please contact night-coverage at [email or web address], password [facility]

## 2017-04-01 NOTE — Progress Notes (Signed)
Subjective:  Has pulled out her trach....twice.  HD yest- removed 563 some low BP and tachy after- now better Objective Vital signs in last 24 hours: Vitals:   04/01/17 0426 04/01/17 0500 04/01/17 0600 04/01/17 0709  BP:  113/72 114/82 103/65  Pulse:  (!) 113 (!) 109 (!) 109  Resp:  (!) 23 (!) 30 (!) 22  Temp:      TempSrc:      SpO2:  100% 93% 98%  Weight: 78 kg (171 lb 15.3 oz)     Height:       Weight change: 3 kg (6 lb 9.8 oz)  Intake/Output Summary (Last 24 hours) at 04/01/17 1610 Last data filed at 04/01/17 0700  Gross per 24 hour  Intake              110 ml  Output              563 ml  Net             -453 ml   Physical Exam: General: resting this AM- on just O2 via trach- responds with a nod when pushed-  Heart: tachy Lungs: coarse BS bilat ant, no rales or wheezing Abdomen: obese, soft Extremities: R heel w/ new posterior ulcer, dry, no odor; L AKA  Dialysis Access: avf on left - good thrill and bruit   Dialysis:  [facility] TTS 4h 23mn   F200   114kg   2/2 bath  LUA AVF  Hep none Iron Sucrose (Venofer) 50 mg IVP During Dialysis 1X Week  Mircera 75 mcg IV q  2 weeks ( last on 02/05/17) Vitamin D (Calcitriol) Oral 0.75 mcg  Po q hd    Summary: Pt is a 36y.o. yo female who was admitted on 02/14/2017 with arrest- now with anoxic brain injury, trach and s/p L AKA this hosp   Assess: 1. Anoxic brain injury- has made some progress, more alert, commands. Eating some now, but aspiration remains concern- NG out. However, not to point where she is stable for OP HD 2. Fever/ PNA /^WBC - on IV zosyn, wbc/ fevers improving- cx neg 3. Hypotension/ tachycardia - not sure is true hypovolemia, giving IVF's previously with 2 liters in- HR not much better- is on low dose metoprolol- vitals stable today  4. ESRD - on TTS HD, started HD June '17. Unable to dialyze fully Saturday due to hypotension.  Done Tuesday- full tx- did have transient hypotension but seemed like after and only 500  removed. Plan for HD on Thursday  5. Anemia- on max aranesp- hgb stable in 8's  6. MBD of CKD - Ca and Phos in range, on calc suspension. PTH 88 here.   7. HTN - soft BP's and tachy, no BP meds needed- on metoprolol prn to keep hr under 120 8. PVD- s/p L AKA- wound vac- will need revision later this week  9. DM2 - per primary 10. Nutrition- unclear if will be able to take enough in to meet needs, albumin 3.0 11. Dispo- difficult with ESRD and trach- [place] Medicaid so not sure LTAC is option.  Mother clinging to the fact that she has improved but did not want to tell her about the revision on AKA- "she has been through so much"   Kariss Longmire A  04/01/2017, 8:23 AM       Labs: Basic Metabolic Panel:  Recent Labs Lab 03/30/17 0227 03/31/17 0258 04/01/17 0241  NA 138 139 138  K 4.4  4.5 3.4*  CL 96* 96* 96*  CO2 26 24 29   GLUCOSE 84 92 94  BUN 71* 78* 19  CREATININE 6.19* 7.05* 3.10*  CALCIUM 9.0 9.1 8.9   Liver Function Tests:  Recent Labs Lab 03/26/17 0352 03/28/17 0308  AST 33 23  ALT 20 15  ALKPHOS 355* 290*  BILITOT 1.5* 1.4*  PROT 8.3* 8.3*  ALBUMIN 3.4* 3.0*   No results for input(s): LIPASE, AMYLASE in the last 168 hours. No results for input(s): AMMONIA in the last 168 hours. CBC:  Recent Labs Lab 03/28/17 0308 03/29/17 0421 03/30/17 0227 03/31/17 0258 04/01/17 0241  WBC 36.8* 21.5* 15.7* 15.9* 16.0*  HGB 9.3* 8.7* 8.1* 8.3* 8.6*  HCT 31.9* 30.4* 28.4* 28.8* 30.8*  MCV 99.1 98.7 96.9 96.6 97.8  PLT 458* 387 387 402* 401*   Cardiac Enzymes: No results for input(s): CKTOTAL, CKMB, CKMBINDEX, TROPONINI in the last 168 hours. CBG:  Recent Labs Lab 03/31/17 0757 03/31/17 1153 03/31/17 2202 04/01/17 0058 04/01/17 0404  GLUCAP 84 128* 158* 120* 95    Iron Studies: No results for input(s): IRON, TIBC, TRANSFERRIN, FERRITIN in the last 72 hours. Studies/Results: No results found. Medications: Infusions: . sodium chloride 10 mL  (03/31/17 0901)  . sodium chloride    . sodium chloride    . piperacillin-tazobactam (ZOSYN)  IV Stopped (04/01/17 0307)    Scheduled Medications: . calcium carbonate (dosed in mg elemental calcium)  250 mg of elemental calcium Oral TID WC  . chlorhexidine gluconate (MEDLINE KIT)  15 mL Mouth Rinse BID  . [START ON 04/02/2017] darbepoetin (ARANESP) injection - DIALYSIS  200 mcg Intravenous Q Thu-HD  . dextrose  1 ampule Intravenous Once  . feeding supplement (NEPRO CARB STEADY)  237 mL Oral BID BM  . feeding supplement (PRO-STAT SUGAR FREE 64)  30 mL Oral BID  . [name]'s butt cream   Topical QID  . heparin  5,000 Units Subcutaneous Q8H  . insulin aspart  0-15 Units Subcutaneous Q4H  . insulin glargine  8 Units Subcutaneous Daily  . mouth rinse  15 mL Mouth Rinse QID  . multivitamin  1 tablet Oral QHS  . pantoprazole sodium  40 mg Per Tube Q24H  . saccharomyces boulardii  250 mg Oral BID    have reviewed scheduled and prn medications.

## 2017-04-01 NOTE — Progress Notes (Signed)
   I asked [name] why she removed her trach twice.  She replied "hurts".    I spent time with [name] and [name] and observed [name] feed herself 2 bites of mashed potatoes using a spoon with no assistance from her mother and no difficulty swallowing.  Beth Richards, PA-C Palliative Medicine Pager: [phone]  No charge note

## 2017-04-01 NOTE — Progress Notes (Signed)
RN called RT because patient pulled out trach. RT reinserted trach and confirmed position with etco2 detector. Patient bilateral breath sounds are clear and pt Sp02 97% on 35% ATC. RT will monitor as needed.

## 2017-04-02 ENCOUNTER — Other Ambulatory Visit (INDEPENDENT_AMBULATORY_CARE_PROVIDER_SITE_OTHER): Payer: Self-pay | Admitting: Family

## 2017-04-02 LAB — COMPREHENSIVE METABOLIC PANEL
ALK PHOS: 205 U/L — AB (ref 38–126)
ALT: 12 U/L — ABNORMAL LOW (ref 14–54)
ANION GAP: 15 (ref 5–15)
AST: 18 U/L (ref 15–41)
Albumin: 2.5 g/dL — ABNORMAL LOW (ref 3.5–5.0)
BILIRUBIN TOTAL: 1.1 mg/dL (ref 0.3–1.2)
BUN: 41 mg/dL — ABNORMAL HIGH (ref 6–20)
CALCIUM: 8.9 mg/dL (ref 8.9–10.3)
CO2: 28 mmol/L (ref 22–32)
Chloride: 98 mmol/L — ABNORMAL LOW (ref 101–111)
Creatinine, Ser: 4.94 mg/dL — ABNORMAL HIGH (ref 0.44–1.00)
GFR calc non Af Amer: 10 mL/min — ABNORMAL LOW (ref >60)
GFR, EST AFRICAN AMERICAN: 12 mL/min — AB (ref >60)
Glucose, Bld: 119 mg/dL — ABNORMAL HIGH (ref 65–99)
Potassium: 4 mmol/L (ref 3.5–5.1)
SODIUM: 141 mmol/L (ref 135–145)
TOTAL PROTEIN: 7 g/dL (ref 6.5–8.1)

## 2017-04-02 LAB — CBC
HCT: 31.2 % — ABNORMAL LOW (ref 36.0–46.0)
HEMOGLOBIN: 8.7 g/dL — AB (ref 12.0–15.0)
MCH: 28 pg (ref 26.0–34.0)
MCHC: 27.9 g/dL — AB (ref 30.0–36.0)
MCV: 100.3 fL — ABNORMAL HIGH (ref 78.0–100.0)
Platelets: 445 10*3/uL — ABNORMAL HIGH (ref 150–400)
RBC: 3.11 MIL/uL — ABNORMAL LOW (ref 3.87–5.11)
RDW: 17.8 % — AB (ref 11.5–15.5)
WBC: 13.7 10*3/uL — ABNORMAL HIGH (ref 4.0–10.5)

## 2017-04-02 LAB — GLUCOSE, CAPILLARY
GLUCOSE-CAPILLARY: 116 mg/dL — AB (ref 65–99)
GLUCOSE-CAPILLARY: 139 mg/dL — AB (ref 65–99)
GLUCOSE-CAPILLARY: 101 mg/dL — AB (ref 65–99)
GLUCOSE-CAPILLARY: 230 mg/dL — AB (ref 65–99)
Glucose-Capillary: 88 mg/dL (ref 65–99)

## 2017-04-02 MED ORDER — ALBUMIN HUMAN 25 % IV SOLN
INTRAVENOUS | Status: AC
  Administered 2017-04-02: 25 g via INTRAVENOUS
  Filled 2017-04-02: qty 100

## 2017-04-02 MED ORDER — ALBUMIN HUMAN 25 % IV SOLN
25.0000 g | Freq: Once | INTRAVENOUS | Status: AC
Start: 2017-04-02 — End: 2017-04-02
  Administered 2017-04-02: 25 g via INTRAVENOUS

## 2017-04-02 MED ORDER — MIDODRINE HCL 5 MG PO TABS
ORAL_TABLET | ORAL | Status: AC
  Administered 2017-04-02: 10 mg via ORAL
  Filled 2017-04-02: qty 2

## 2017-04-02 MED ORDER — MIDODRINE HCL 5 MG PO TABS: 10.0000 mg | ORAL_TABLET | Freq: Once | ORAL | Status: DC

## 2017-04-02 MED ORDER — DARBEPOETIN ALFA 200 MCG/0.4ML IJ SOSY
PREFILLED_SYRINGE | INTRAMUSCULAR | Status: AC
  Administered 2017-04-02: 200 ug via INTRAVENOUS
  Filled 2017-04-02: qty 0.4

## 2017-04-02 MED ORDER — MIDODRINE HCL 5 MG PO TABS
10.0000 mg | ORAL_TABLET | ORAL | Status: DC
  Administered 2017-04-02: 10 mg via ORAL

## 2017-04-02 NOTE — Progress Notes (Signed)
Patient arrived to unit per bed.  Reviewed treatment plan and this RN agrees.  Report received from bedside RN, Claris Che.  Consent verified.  Patient Alert, oriented to self. Lung sounds clear to ausculation in all fields. No edema. Cardiac: ST.  Prepped LUAVF with alcohol and cannulated with two 15 gauge needles.  Pulsation of blood noted.  Flushed access well with saline per protocol.  Connected and secured lines and initiated tx at 1432.  UF goal of 1500 mL and net fluid removal of 1000 mL.  Will continue to monitor.

## 2017-04-02 NOTE — Progress Notes (Signed)
SLP Cancellation Note  Patient Details Name: [name] MRN: [id number] DOB: 1981-08-27   Cancelled treatment:       Reason Eval/Treat Not Completed: Patient at procedure or test/unavailable   Oletta Buehring, [name] 04/02/2017, 2:46 PM

## 2017-04-02 NOTE — Progress Notes (Signed)
HD tx completed @ 1832 w/ hypotensive episode that was relieved by midodrine admin, UF goal met, blood rinsed back, report called to primary nurse

## 2017-04-02 NOTE — Progress Notes (Signed)
RT in unit charting when approached by the Pt's NT.  Pt family member in room concerned Pt had pulled her trach out.  RT entered Pt room and assessed Pt, finding that she had in fact pulled trach most of the way out.  Trach reinserted and confirmed via EtCO2 colormetric detector.  RN aware.

## 2017-04-02 NOTE — Evaluation (Signed)
Occupational Therapy Evaluation Patient Details Name: [name] MRN: [id number] DOB: 06/18/1981 Today's Date: 04/02/2017    History of Present Illness [name] is a 36 y.o. 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 ETT 5/19 and trach'd 5/31.    Clinical Impression   Pt was independent prior to admission. Presents with generalized weakness with poor trunk and head control. She demonstrates impaired cognition. Pt is currently dependent in all ADL and requires 2 person assist for bed level mobility. Recommending SNF upon d/c. Will follow acutely.    Follow Up Recommendations  SNF;Supervision/Assistance - 24 hour    Equipment Recommendations  Wheelchair (measurements OT);Wheelchair cushion (measurements OT);Hospital bed;3 in 1 bedside commode    Recommendations for Other Services       Precautions / Restrictions Precautions Precautions: Fall Precaution Comments: Trach Restrictions Weight Bearing Restrictions: Yes RLE Weight Bearing: Non weight bearing LLE Weight Bearing: Non weight bearing Other Position/Activity Restrictions: PRAFO boot on RLE      Mobility Bed Mobility Overal bed mobility: Needs Assistance Bed Mobility: Rolling;Sidelying to Sit;Sit to Supine Rolling: Max assist Sidelying to sit: Max assist;+2 for physical assistance   Sit to supine: Max assist;+2 for physical assistance   General bed mobility comments: Required tactile cues to initiate rolling.  Was able to reach with RUE for rail.  Assist for lower body.  Required +2 max assist to move LE's off of bed and to raise trunk/head to sitting position.  Patient with poor head control and zero trunk control in sitting.  Attempted to work on shifting weight  forward/backward and to right side.  Required max assist for trunk control.  Was able to hold head upright initially.  With fatigue, head control decreased.  No attempt to right head with lateral leans.  Returned to supine with +2 max assist.  Transfers                 General transfer comment: NT, will require lift equipment    Balance Overall balance assessment: Needs assistance Sitting-balance support: Bilateral upper extremity supported Sitting balance-Leahy Scale: Zero Sitting balance - Comments: Required mod to max assist to maintain sitting balance.  Minimal ability to correct posture. Postural control: Right lateral lean;Posterior lean                                 ADL either performed or assessed with clinical judgement   ADL Overall ADL's : Needs assistance/impaired Eating/Feeding: Maximal assistance;Bed level   Grooming: Total assistance;Bed level   Upper Body Bathing: Total assistance;Bed level   Lower Body Bathing: Total assistance;Bed level   Upper Body Dressing : Total assistance;Bed level   Lower Body Dressing: Total assistance;Bed level       Toileting- Clothing Manipulation and Hygiene: Total assistance;Bed level               Vision   Additional Comments: appears intact, difficult to assess due to impaired cognition     Perception     Praxis      Pertinent Vitals/Pain Pain Assessment: Faces Faces Pain Scale: Hurts even more Pain Location: R foot to touch Pain Descriptors /  Indicators: Grimacing Pain Intervention(s): Monitored during session;Repositioned     Hand Dominance Right   Extremity/Trunk Assessment Upper Extremity Assessment Upper Extremity Assessment: Generalized weakness   Lower Extremity Assessment Lower Extremity Assessment: Defer to PT evaluation   Cervical / Trunk Assessment Cervical / Trunk Assessment: Kyphotic;Other exceptions Cervical / Trunk Exceptions: poor head control, marked truncal  weakness   Communication Communication Communication: Tracheostomy   Cognition Arousal/Alertness: Lethargic Behavior During Therapy: Flat affect Overall Cognitive Status: Difficult to assess Area of Impairment: Following commands;Awareness;Problem solving;Attention                   Current Attention Level: Sustained   Following Commands: Follows one step commands inconsistently;Follows one step commands with increased time     Problem Solving: Slow processing;Decreased initiation;Difficulty sequencing;Requires tactile cues;Requires verbal cues General Comments: pt attempting to bite when hand is near her mouth   General Comments       Exercises     Shoulder Instructions      Home Living Family/patient expects to be discharged to:: Private residence Living Arrangements: Spouse/significant other;Children Available Help at Discharge: Family;Available 24 hours/day Type of Home: Apartment Home Access: Stairs to enter [employer] of Steps: full flight Entrance Stairs-Rails: Right Home Layout: One level               Home Equipment: None   Additional Comments: Pt lives with her boyfriend.       Prior Functioning/Environment Level of Independence: Independent                 OT Problem List: Decreased strength;Decreased activity tolerance;Impaired balance (sitting and/or standing);Decreased coordination;Decreased cognition;Decreased knowledge of use of DME or AE;Impaired UE functional use;Pain;Impaired tone      OT Treatment/Interventions: Self-care/ADL training;DME and/or AE instruction;Therapeutic activities;Cognitive remediation/compensation;Patient/family education;Balance training;Therapeutic exercise    OT Goals(Current goals can be found in the care plan section) Acute Rehab OT Goals Patient Stated Goal: get more mobile OT Goal Formulation: With family Time For Goal Achievement: 04/16/17 Potential to Achieve Goals: Fair ADL  Goals Pt Will Perform Eating: with mod assist;sitting Pt Will Perform Grooming: with mod assist;sitting;bed level Additional ADL Goal #1: Pt will follow one step commands 50 % of time. Additional ADL Goal #2: Pt will roll with min assist for pericare and repositioning. Additional ADL Goal #3: Pt will sit with min assist and head at midline x 5 minutes.  OT Frequency: Min 2X/week   Barriers to D/C:            Co-evaluation PT/OT/SLP Co-Evaluation/Treatment: Yes Reason for Co-Treatment: For patient/therapist safety;Complexity of the patient's impairments (multi-system involvement) PT goals addressed during session: Mobility/safety with mobility;Balance OT goals addressed during session: Strengthening/ROM      AM-PAC PT "6 Clicks" Daily Activity     Outcome Measure Help from another person eating meals?: A Lot Help from another person taking care of personal grooming?: Total Help from another person toileting, which includes using toliet, bedpan, or urinal?: Total Help from another person bathing (including washing, rinsing, drying)?: Total Help from another person to put on and taking off regular upper body clothing?: Total Help from another person to put on and taking off regular lower body clothing?: Total 6 Click Score: 7   End of Session Equipment Utilized During Treatment: Oxygen  Activity Tolerance: Patient tolerated treatment well Patient left: in bed;with call bell/phone within reach;with family/visitor present  OT Visit Diagnosis: Muscle weakness (generalized) (M62.81);Pain;Cognitive communication deficit (R41.841) Pain - Right/Left: Left Pain -  part of body: Ankle and joints of foot                Time: 4098-1191 OT Time Calculation (min): 21 min Charges:  OT General Charges $OT Visit: 1 Procedure OT Evaluation $OT Eval Moderate Complexity: 1 Procedure G-Codes:     Evern Bio 04/02/2017, 2:30 PM  [phone]

## 2017-04-02 NOTE — Procedures (Signed)
I have personally attended this patient's dialysis session.   Low BP and tachycardia with restlessness - albumin for BP support Will start some midodrine 10 mg w/HD No AVF issues   Camille Bal, MD [facility] [phone] Pager 04/02/2017, 3:25 PM

## 2017-04-02 NOTE — Progress Notes (Addendum)
Physical Therapy Treatment Patient Details Name: [name] MRN: [id number] DOB: 31-Oct-1980 Today's Date: 04/02/2017    History of Present Illness [name] is a 36 y.o. 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 ETT 5/19 and trach'd 5/31.     PT Comments    Patient continues to require +2 max assist for mobility/sitting EOB.  Continue to recommend SNF at d/c for continued therapy.   Follow Up Recommendations  SNF;Supervision/Assistance - 24 hour     Equipment Recommendations  Other (comment) (TBD)    Recommendations for Other Services       Precautions / Restrictions Precautions Precautions: Fall Precaution Comments: Trach Restrictions Weight Bearing Restrictions: Yes RLE Weight Bearing: Non weight bearing LLE Weight Bearing: Non weight bearing Other Position/Activity Restrictions: PRAFO boot on RLE    Mobility  Bed Mobility Overal bed mobility: Needs Assistance Bed Mobility: Rolling;Sidelying to Sit;Sit to Supine Rolling: Max assist Sidelying to sit: Max assist;+2 for physical assistance   Sit to supine: Max assist;+2 for physical assistance   General bed mobility comments: Required tactile cues to initiate rolling.  Was able to reach with RUE for rail.  Assist for lower body.  Required +2 max assist to move LE's off of bed and to raise trunk/head to sitting position.  Patient with poor head control and zero trunk control in sitting.  Attempted to work on shifting weight forward/backward and to right side.  Required max assist for trunk control.  Was able to hold head upright initially.  With fatigue, head control decreased.  No attempt to right head with lateral leans.  Returned to supine with +2 max  assist.  Transfers                 General transfer comment: NT  Ambulation/Gait                 Stairs            Wheelchair Mobility    Modified Rankin (Stroke Patients Only)       Balance Overall balance assessment: Needs assistance Sitting-balance support: Bilateral upper extremity supported Sitting balance-Leahy Scale: Zero Sitting balance - Comments: Required mod to max assist to maintain sitting balance.  Minimal ability to correct posture. Postural control: Right lateral lean;Posterior lean                                  Cognition Arousal/Alertness: Lethargic Behavior During Therapy: Flat affect Overall Cognitive Status: Difficult to assess Area of Impairment: Following commands;Awareness;Problem solving;Attention                   Current Attention Level: Sustained   Following Commands: Follows one step commands inconsistently;Follows one step commands with increased time     Problem Solving: Slow processing;Decreased initiation;Difficulty sequencing;Requires tactile cues;Requires verbal cues        Exercises      General Comments        Pertinent Vitals/Pain Pain Assessment: Faces Faces Pain Scale: Hurts even more Pain Location: R foot to touch Pain Descriptors / Indicators: Grimacing Pain Intervention(s): Monitored during session;Repositioned    Home Living  Prior Function            PT Goals (current goals can now be found in the care plan section) Progress towards PT goals: Progressing toward goals    Frequency    Min 3X/week      PT Plan Current plan remains appropriate    Co-evaluation PT/OT/SLP Co-Evaluation/Treatment: Yes Reason for Co-Treatment: Complexity of the patient's impairments (multi-system involvement);For patient/therapist safety PT goals addressed during session: Mobility/safety with mobility;Balance        AM-PAC PT "6 Clicks" Daily  Activity  Outcome Measure  Difficulty turning over in bed (including adjusting bedclothes, sheets and blankets)?: Total Difficulty moving from lying on back to sitting on the side of the bed? : Total Difficulty sitting down on and standing up from a chair with arms (e.g., wheelchair, bedside commode, etc,.)?: Total Help needed moving to and from a bed to chair (including a wheelchair)?: Total Help needed walking in hospital room?: Total Help needed climbing 3-5 steps with a railing? : Total 6 Click Score: 6    End of Session Equipment Utilized During Treatment: Oxygen Activity Tolerance: Patient limited by fatigue Patient left: in bed;with call bell/phone within reach;with family/visitor present;with restraints reapplied (mitts reapplied) Nurse Communication: Mobility status;Need for lift equipment PT Visit Diagnosis: Muscle weakness (generalized) (M62.81);Pain;Other abnormalities of gait and mobility (R26.89) Pain - Right/Left: Right Pain - part of body: Ankle and joints of foot     Time: 1610-9604 PT Time Calculation (min) (ACUTE ONLY): 21 min  Charges:  $Therapeutic Activity: 8-22 mins                    G Codes:       Durenda Hurt. Renaldo Fiddler, [facility] Acute Rehab Services Pager [phone]    Vena Austria 04/02/2017, 1:52 PM

## 2017-04-02 NOTE — Progress Notes (Addendum)
[facility] Rounding Note  Subjective:  Has not pulled trach out any more Told Palliative was because "it hurt" Objective Vital signs in last 24 hours: Vitals:   04/02/17 0819 04/02/17 0900 04/02/17 1000 04/02/17 1214  BP: 106/67 95/71 98/60  (!) 123/112  Pulse: (!) 110  (!) 112 (!) 106  Resp: (!) 25  (!) 24 (!) 23  Temp: (!) 97.4 F (36.3 C)   98 F (36.7 C)  TempSrc: Axillary   Oral  SpO2: 96%  99% 97%  Weight:      Height:       Weight change: -7 kg (-15 lb 6.9 oz)  Intake/Output Summary (Last 24 hours) at 04/02/17 1345 Last data filed at 04/02/17 0400  Gross per 24 hour  Intake              230 ml  Output                0 ml  Net              230 ml   Physical Exam: Mom in the room with her encouraging her to feed self Trach/trach collar Looking around, holds spoon and takes a bite of potatoes with encouragement Anteriorly coarse BS but grossly clear Tachy S1S2 No S3 Abd obese, soft and non tender Left AKA with dressing in place not removed Right foot in protective boot (on prev exam was said to have new posterior heel ulcer, dry, no odor Dialysis Access: Left AVF + bruit and thrill   Recent Labs Lab 03/31/17 0258 04/01/17 0241 04/02/17 0709  NA 139 138 141  K 4.5 3.4* 4.0  CL 96* 96* 98*  CO2 24 29 28   GLUCOSE 92 94 119*  BUN 78* 19 41*  CREATININE 7.05* 3.10* 4.94*  CALCIUM 9.1 8.9 8.9    Recent Labs Lab 03/28/17 0308 04/02/17 0709  AST 23 18  ALT 15 12*  ALKPHOS 290* 205*  BILITOT 1.4* 1.1  PROT 8.3* 7.0  ALBUMIN 3.0* 2.5*    Recent Labs Lab 03/29/17 0421 03/30/17 0227 03/31/17 0258 04/01/17 0241 04/02/17 0709  WBC 21.5* 15.7* 15.9* 16.0* 13.7*  HGB 8.7* 8.1* 8.3* 8.6* 8.7*  HCT 30.4* 28.4* 28.8* 30.8* 31.2*  MCV 98.7 96.9 96.6 97.8 100.3*  PLT 387 387 402* 401* 445*     Recent Labs Lab 04/01/17 1837 04/01/17 1939 04/01/17 2337 04/02/17 0359 04/02/17 0816  GLUCAP 106* 133* 187* 88 116*    Medications: Infusions: . sodium  chloride 10 mL (03/31/17 0901)  . sodium chloride    . sodium chloride    . piperacillin-tazobactam (ZOSYN)  IV Stopped (04/02/17 0219)    Scheduled Medications: . calcium carbonate (dosed in mg elemental calcium)  250 mg of elemental calcium Oral TID WC  . chlorhexidine gluconate (MEDLINE KIT)  15 mL Mouth Rinse BID  . darbepoetin (ARANESP) injection - DIALYSIS  200 mcg Intravenous Q Thu-HD  . dextrose  1 ampule Intravenous Once  . feeding supplement (NEPRO CARB STEADY)  237 mL Oral BID BM  . feeding supplement (PRO-STAT SUGAR FREE 64)  30 mL Oral BID  . [name]'s butt cream   Topical QID  . heparin  5,000 Units Subcutaneous Q8H  . insulin aspart  0-15 Units Subcutaneous Q4H  . insulin glargine  8 Units Subcutaneous Daily  . mouth rinse  15 mL Mouth Rinse QID  . multivitamin  1 tablet Oral QHS  . pantoprazole sodium  40 mg Per Tube Q24H  .  saccharomyces boulardii  250 mg Oral BID   Dialysis:  [facility] TTS 4h 42mn   F200   114kg   2/2 bath  LUA AVF  Hep none Iron Sucrose (Venofer) 50 mg IVP During Dialysis 1X Week  Mircera 75 mcg IV q  2 weeks ( last on 02/05/17) Vitamin D (Calcitriol) Oral 0.75 mcg  Po q hd    Summary: 36year old female w/ ESRD d/t poorly controlled DM, HD bilateral TMA's, obesity, HTN, medical non-compliance, NICM. Admitted 5/19 w/left leg pain, + radiological evidence of osteomyelitis w/wound nonhealing, dehiscence, purulent drainage. Admitted for IV antibiotics, hemodialysis and possible surgery. Found unresponsive in her room 5/19. Rec'd  CPR, epi bicarb; intubated; cooled. Now with anoxic brain injury, trach and s/p L AKA this hosp. Course further complicated by recurrent aspiration PNA,   Problems/Plans  1. Anoxic brain injury- some progress since I last saw (01/2017) as is awake, follow commands, interacts, eats some, but issues with recurrent aspiration. has made some progress, more alert, commands. Eating some now, but aspiration remains concern- NG out.  However, not to point where she is stable for OP HD 2. Trach - on trach collar. Has pulled out twice herself in past few days. Not sure what the plan is for decannulation. Not on vent since 6/20.  3. Fever/ PNA /^WBC - recurrent asp'n 6/28. Zosyn since 6/30.  4. Hypotension/ tachycardia - Soft BP, volume status difficult to assess. Intolerant of standing doses of BB for tachycardia.  5. ESRD - on TTS HD, started HD June '17. Unable to dialyze fully Saturday due to hypotension.  Done Tuesday- full tx- did have transient hypotension but seemed like after and only 500 removed. Plan for HD today. Unclear why we are not using midodrine - will review notes to make sure has not been advised against. 6. Anemia- on max aranesp- hgb stable in 8's  7. MBD of CKD - Ca and Phos in range, on calc suspension. PTH 88 here.   8. HTN - soft BP's and tachy, no BP meds needed- on metoprolol prn only to keep hr under 120 9. PVD- s/p L AKA 03/13/17, VAC off 6/20. Needs further revision.  10. DM2 - per primary 11. Nutrition- unclear if will be able to take enough in to meet needs, albumin 3.0 12. Dispo- difficult with ESRD and trach- [place] Medicaid so not sure LTAC is option.  Not stable enough for HD in the outpt setting.   CJamal Maes MD CPoncha SpringsPager 04/02/2017, 1:55 PM

## 2017-04-02 NOTE — Progress Notes (Signed)
[facility] PROGRESS NOTE  Hollyann Pablo FEO:[id number] DOB: 1980/11/04 DOA: 02/14/2017  PCP: Vicenta Aly, FNP  Brief History/Interval Summary: Patient is a 36 y.o. female with history of ESRD on HD, poorly controlled diabetes, history of bilateral transmetatarsal foot amputation with subsequent left foot wound dehiscence (refused BKA as outpatient), chronic systolic heart failure felt to be secondary to nonischemic cardiomyopathy admitted on 02/14/17 with wound dehiscence and persistent drainage from the left trans-metatarsal amputation site. She was [facility] to the [facility]. She was subsequently found to be unresponsive in her room on 5/19 after getting IV Dilaudid-she was  in asystole, CPR was started, with ROSC in around 9 minutes.She was then transferred to the intensive care unit, and subsequently underwent cooling and then rewarming. She was unable to be liberated off the ventilator for several weeks , and as a result underwent tracheostomy on 5/31. Patient has been evaluated by neurology during this hospital stay, per neurology chances of meaningful neurological recovery was dismal. Hospital course has now been complicated by persistent leukocytosis, respiratory failure, and wound dehiscence with gangrene causing persistent SIRS pathophysiology and now suspected aspiration PNA. 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 Since then the patient has been weaned off the vent and maintained on trach collar. Low blood pressures have been an issue , especially with respect to her need for dialysis. Palliative medicine is following.   Consultants:   Infectious disease:  Neurology  Gastroenterology  Wound care  PCCM  Nephrology  Orthopedics  Procedures:   Transthoracic echocardiogram 5/20 Study Conclusions  - Left ventricle: The cavity size was normal. Wall thickness was   increased in a pattern of mild LVH. Systolic function was   moderately to severely reduced. The  estimated ejection fraction   was in the range of 30% to 35%. Diffuse hypokinesis. There is   akinesis of the basal-midinferior myocardium. Features are   consistent with a pseudonormal left ventricular filling pattern,   with concomitant abnormal relaxation and increased filling   pressure (grade 2 diastolic dysfunction). - Mitral valve: There was moderate regurgitation. Valve area by   pressure half-time: 2.44 cm^2. - Left atrium: The atrium was mildly dilated. - Right ventricle: The cavity size was mildly dilated. Wall   thickness was normal. - Tricuspid valve: There was mild regurgitation. - Pulmonary arteries: Systolic pressure was mildly increased. PA   peak pressure: 43 mm Hg (S). - Pericardium, extracardiac: A moderate pericardial effusion was   identified circumferential to the heart. . Largest pocket of   fluid is posterior to right atrium (2cm). No evidence of   tamponade physiology.  Transthoracic echocardiogram 6/28 Study Conclusions  - Left ventricle: The cavity size was normal. There was mild   concentric hypertrophy. Systolic function was severely reduced.   The estimated ejection fraction was in the range of 25% to 30%.   Diffuse hypokinesis. Regional wall motion abnormalities cannot be   excluded. - Mitral valve: There was mild regurgitation. - Pericardium, extracardiac: A trivial pericardial effusion was   identified circumferential to the heart.  Left above-knee amputation. 6/15  Antibiotics: She has previously completed multiple courses of antibiotics. Was started back on ceftazidime 6/29. Changed over to Zosyn to cover anaerobic organisms on 6/30.  Subjective/Interval History: The patient is continuing to show signs of improvement she is becoming more alert more awake oriented stable hemodynamically without any complaints.  Objective:  Vital Signs  Vitals:   04/02/17 1500 04/02/17 1515 04/02/17 1530 04/02/17 1600  BP: Marland Kitchen)  76/60 94/66 111/80 93/64    Pulse: (!) 122 (!) 120 (!) 112 (!) 116  Resp:      Temp:      TempSrc:      SpO2:      Weight:      Height:        Intake/Output Summary (Last 24 hours) at 04/02/17 1629 Last data filed at 04/02/17 0400  Gross per 24 hour  Intake              230 ml  Output                0 ml  Net              230 ml   Filed Weights   04/01/17 0426 04/02/17 0359 04/02/17 1419  Weight: 78 kg (171 lb 15.3 oz) 74 kg (163 lb 2.3 oz) 74 kg (163 lb 2.3 oz)   Telemetry: Occasional sinus tachycardia.   General appearance:Alert, awake and responsive .  Resp: CTAB , NO CRACKLES , no wheezes. Cardio: S1, S2 is normal, regular. No S3, S4. No rubs, murmurs or bruit GI: Abdomen is soft, nontender, nondistended. Bowel sounds are present. No masses or organomegaly Extremities: Left AKA. Wound dehiscence has been noted with exposed bone. Black area noted in the right heel. No erythema. No drainage. Neurologic: Awake, alert. Does not speak. Moving all her extremities.  Lab Results:  Data Reviewed: I have personally reviewed following labs and imaging studies  CBC:  Recent Labs Lab 03/29/17 0421 03/30/17 0227 03/31/17 0258 04/01/17 0241 04/02/17 0709  WBC 21.5* 15.7* 15.9* 16.0* 13.7*  HGB 8.7* 8.1* 8.3* 8.6* 8.7*  HCT 30.4* 28.4* 28.8* 30.8* 31.2*  MCV 98.7 96.9 96.6 97.8 100.3*  PLT 387 387 402* 401* 445*    Basic Metabolic Panel:  Recent Labs Lab 03/29/17 0421 03/30/17 0227 03/31/17 0258 04/01/17 0241 04/02/17 0709  NA 138 138 139 138 141  K 4.2 4.4 4.5 3.4* 4.0  CL 94* 96* 96* 96* 98*  CO2 26 26 24 29 28   GLUCOSE 112* 84 92 94 119*  BUN 60* 71* 78* 19 41*  CREATININE 5.29* 6.19* 7.05* 3.10* 4.94*  CALCIUM 9.3 9.0 9.1 8.9 8.9    GFR: Estimated Creatinine Clearance: 16 mL/min (A) (by C-G formula based on SCr of 4.94 mg/dL (H)).  Liver Function Tests:  Recent Labs Lab 03/28/17 0308 04/02/17 0709  AST 23 18  ALT 15 12*  ALKPHOS 290* 205*  BILITOT 1.4* 1.1  PROT 8.3*  7.0  ALBUMIN 3.0* 2.5*     CBG:  Recent Labs Lab 04/01/17 1939 04/01/17 2337 04/02/17 0359 04/02/17 0816 04/02/17 1213  GLUCAP 133* 187* 88 116* 139*     Recent Results (from the past 240 hour(s))  Culture, blood (routine x 2)     Status: None   Collection Time: 03/26/17  2:31 PM  Result Value Ref Range Status   Specimen Description BLOOD LEFT ARM  Final   Special Requests IN PEDIATRIC BOTTLE Blood Culture adequate volume  Final   Culture NO GROWTH 5 DAYS  Final   Report Status 03/31/2017 FINAL  Final  Culture, blood (routine x 2)     Status: None   Collection Time: 03/26/17  2:31 PM  Result Value Ref Range Status   Specimen Description BLOOD RIGHT HAND  Final   Special Requests IN PEDIATRIC BOTTLE Blood Culture adequate volume  Final   Culture NO GROWTH 5 DAYS  Final  Report Status 03/31/2017 FINAL  Final  MRSA PCR Screening     Status: None   Collection Time: 03/27/17  8:09 AM  Result Value Ref Range Status   MRSA by PCR NEGATIVE NEGATIVE Final    Comment:        The GeneXpert MRSA Assay (FDA approved for NASAL specimens only), is one component of a comprehensive MRSA colonization surveillance program. It is not intended to diagnose MRSA infection nor to guide or monitor treatment for MRSA infections.       Radiology Studies: No results found.   Medications:  Scheduled: . calcium carbonate (dosed in mg elemental calcium)  250 mg of elemental calcium Oral TID WC  . chlorhexidine gluconate (MEDLINE KIT)  15 mL Mouth Rinse BID  . darbepoetin (ARANESP) injection - DIALYSIS  200 mcg Intravenous Q Thu-HD  . dextrose  1 ampule Intravenous Once  . feeding supplement (NEPRO CARB STEADY)  237 mL Oral BID BM  . feeding supplement (PRO-STAT SUGAR FREE 64)  30 mL Oral BID  . [name]'s butt cream   Topical QID  . heparin  5,000 Units Subcutaneous Q8H  . insulin aspart  0-15 Units Subcutaneous Q4H  . insulin glargine  8 Units Subcutaneous Daily  . mouth rinse   15 mL Mouth Rinse QID  . midodrine  10 mg Oral Q T,Th,Sa-HD  . multivitamin  1 tablet Oral QHS  . pantoprazole sodium  40 mg Per Tube Q24H  . saccharomyces boulardii  250 mg Oral BID   Continuous: . sodium chloride 10 mL (03/31/17 0901)  . sodium chloride    . sodium chloride    . piperacillin-tazobactam (ZOSYN)  IV Stopped (04/02/17 0219)   VQQ:VZDGLO chloride, sodium chloride, acetaminophen (TYLENOL) oral liquid 160 mg/5 mL, albuterol, alteplase, fentaNYL (SUBLIMAZE) injection, heparin, lidocaine (PF), lidocaine-prilocaine, loperamide, metoprolol tartrate, pentafluoroprop-tetrafluoroeth, RESOURCE THICKENUP CLEAR  Assessment/Plan:  Principal Problem:   Acute on chronic respiratory failure with hypoxemia (HCC) Active Problems:   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)   Normocytic anemia   Hyperlipidemia   Hypertension   Non-ischemic cardiomyopathy - by echo 8/16- EF 35-40%   Morbid obesity-    Acute combined systolic and diastolic heart failure (HCC)   ESRD (end stage renal disease) ([place])   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)   Diabetic osteomyelitis ([place])   Dehiscence of amputation stump (HCC)   Osteomyelitis (HCC)   Chronic pain syndrome   Cardiac arrest, cause unspecified ([place])   Pressure injury of skin   Wound dehiscence   Coma ([place])   Elevated liver enzymes   Jaundice   Encounter for central line placement   Protein calorie malnutrition ([place])   DNR (do not resuscitate) discussion   Palliative care by specialist   Tachycardia   Goals of care, counseling/discussion   Anoxic brain injury [facility])   Palliative care encounter   Tracheostomy status ([place])   Abscess of left leg   Hypoxemia   Fever   Aspiration into airway    Acute /chronic hypoxemic respiratory failure in a setting of cardiac arrest Continues to improve. Last ventilator use was on night of 6/19-6/20. On TC for several days. PCCM following for resp issues -tracheostomy  in place. Change to #4 cuffless trach. S/P MBS 6/23. Now status post FEES 6/27. Patient started on nectar thick liquids and dysphagia 1 diet 6/27. Holding off on PEG for now. Cortrak removed 6/28. Unfortunately patient has aspirated again. She was febrile and tachypneic. Repeat  chest x-ray on 6/29 showed new right-sided infiltrate. She was started on [place] on 6/29 and changed over to Zosyn to provide anaerobic coverage. Seems to be stable. Chest x-ray from 7/1 showed improving opacity in the right lung.  Systemic inflammatory response syndrome/Persistent leukocytosis/aspiration pneumonia Improving . Patient developed recurrent aspiration on 6/28-29. She was started back on antibiotics. She is improving. Heart rate is improved. Continue Zosyn for now. Initial infections were due to left foot gangrene and abscess. Now, she status post AKA. Blood cultures have been done multiple times and have been negative so far. She has had C. difficile testing, which was also been negative. CT scan did not show any PE. She's completed multiple courses of antibiotics. Most recently started on [place]az on 6/29 and changed to Zosyn on 6/30.    ESRD On HD per Nephrology since June 17 , TTS , Tolerating it well, continue to follow up with Nephrology .  Left trans-metatarsal stump wound dehiscence with infection and gangrene Per operative note-pus/abscess noted extending up to the knee when BKA was attempted, subsequently underwent a AKA. Note, initially family was very reluctant to proceed with amputation, but given persistent leukocytosis/fever and potential risk of sepsis, family agreed for amputation, subsequently AKA was done on 6/15. Orthopedics removed wound VAC 6/20. Seen by orthopedics. The plan is for revision of her left AKA hopefully later this week . Wound care nurse following for the pressure wound on the right heel.  Anoxic brain injury secondary to Cardiac arrest on 5/19 Patient has shown some cognitive  improvement over the last manydays. She has been noted to follow certain commands at times. Her prognosis remains guarded. She has been seen by neurology during this hospitalization.    Diarrhea, continues to have loose stools Continue probiotics and Imodium as needed. Try to discontinue Flexiseal.  Normocytic anemia Likely secondary to chronic disease-probably worsened by acute illness. No signs of bleeding, hemoglobin has been stable with some fluctuations.  Diabetes mellitus type 2  Continue with SSI. On Lantus. HbA1c 5.9.  Chronic systolic heart failure/nonischemic cardiomyopathy Volume was being managed with hemodialysis. Patient was placed on beta blocker, but she has not been able to due to low blood pressure. Coreg has been discontinued. Echocardiogram reports as above. EF 25-30% per last echocardiogram.  Pericardial effusion Seen on TEE-with no evidence of tamponade pathophysiology. Repeat echo 6/26 with no significant pericardial effusion.  Moderate protein calorie malnutrition Started orals 6/27. Aspiration precautions.   Goals of care Prognosis is guarded.  DVT Prophylaxis: Subcutaneous heparin    Code Status: Full code  Family Communication: Discussed with the mother  Disposition Plan: Management as outlined above.    Waldron Session  [facility] Pager [phone] 04/02/2017, 4:29 PM  If 7PM-7AM, please contact night-coverage at [email or web address], password [facility]

## 2017-04-03 ENCOUNTER — Inpatient Hospital Stay (HOSPITAL_COMMUNITY): Payer: Medicaid Other | Admitting: Certified Registered"

## 2017-04-03 ENCOUNTER — Encounter (HOSPITAL_COMMUNITY)
Admission: EM | Disposition: A | Discharge status: Skilled Nursing Facility | Payer: Self-pay | Source: Home / Self Care | Attending: Pulmonary Disease

## 2017-04-03 ENCOUNTER — Encounter (HOSPITAL_COMMUNITY): Payer: Self-pay | Admitting: Anesthesiology

## 2017-04-03 DIAGNOSIS — T8781 Dehiscence of amputation stump: Secondary | ICD-10-CM

## 2017-04-03 HISTORY — PX: STUMP REVISION: SHX6102

## 2017-04-03 HISTORY — PX: APPLICATION OF WOUND VAC: SHX5189

## 2017-04-03 LAB — GLUCOSE, CAPILLARY
GLUCOSE-CAPILLARY: 177 mg/dL — AB (ref 65–99)
GLUCOSE-CAPILLARY: 74 mg/dL (ref 65–99)
Glucose-Capillary: 85 mg/dL (ref 65–99)
Glucose-Capillary: 95 mg/dL (ref 65–99)
Glucose-Capillary: 104 mg/dL — ABNORMAL HIGH (ref 65–99)
Glucose-Capillary: 95 mg/dL (ref 65–99)
Glucose-Capillary: 104 mg/dL — ABNORMAL HIGH (ref 65–99)

## 2017-04-03 SURGERY — REVISION, AMPUTATION SITE
Anesthesia: General | Site: Leg Upper | Laterality: Left

## 2017-04-03 MED ORDER — ONDANSETRON HCL 4 MG/2ML IJ SOLN: 4.0000 mg | Freq: Once | INTRAMUSCULAR | Status: DC | PRN

## 2017-04-03 MED ORDER — POLYETHYLENE GLYCOL 3350 17 G PO PACK
17.0000 g | PACK | Freq: Every day | ORAL | Status: DC | PRN
Start: 2017-04-03 — End: 2017-04-30

## 2017-04-03 MED ORDER — MAGNESIUM CITRATE PO SOLN: 1.0000 | Freq: Once | ORAL | Status: DC | PRN

## 2017-04-03 MED ORDER — 0.9 % SODIUM CHLORIDE (POUR BTL) OPTIME
TOPICAL | Status: DC | PRN
  Administered 2017-04-03: 1000 mL

## 2017-04-03 MED ORDER — LIDOCAINE HCL (CARDIAC) 20 MG/ML IV SOLN
INTRAVENOUS | Status: DC | PRN
  Administered 2017-04-03: 60 mg via INTRAVENOUS

## 2017-04-03 MED ORDER — METHOCARBAMOL 500 MG PO TABS: 500.0000 mg | ORAL_TABLET | Freq: Four times a day (QID) | ORAL | Status: DC | PRN

## 2017-04-03 MED ORDER — LACTATED RINGERS IV SOLN: INTRAVENOUS | Status: DC | PRN

## 2017-04-03 MED ORDER — SODIUM CHLORIDE 0.9 % IV SOLN
0.0000 ug/min | INTRAVENOUS | Status: DC
  Administered 2017-04-03: 50 ug/min via INTRAVENOUS
  Filled 2017-04-03: qty 1

## 2017-04-03 MED ORDER — ONDANSETRON HCL 4 MG PO TABS: 4.0000 mg | ORAL_TABLET | Freq: Four times a day (QID) | ORAL | Status: DC | PRN

## 2017-04-03 MED ORDER — CHLORHEXIDINE GLUCONATE 4 % EX LIQD
60.0000 mL | Freq: Once | CUTANEOUS | Status: DC
  Filled 2017-04-03: qty 60

## 2017-04-03 MED ORDER — BISACODYL 10 MG RE SUPP: 10.0000 mg | Freq: Every day | RECTAL | Status: DC | PRN

## 2017-04-03 MED ORDER — DEXTROSE 5 % IV SOLN
INTRAVENOUS | Status: DC | PRN
  Administered 2017-04-03: 50 ug/min via INTRAVENOUS

## 2017-04-03 MED ORDER — ACETAMINOPHEN 325 MG PO TABS
650.0000 mg | ORAL_TABLET | Freq: Four times a day (QID) | ORAL | Status: DC | PRN
  Administered 2017-04-09 – 2017-04-18 (×3): 650 mg via ORAL
  Filled 2017-04-03 (×2): qty 2

## 2017-04-03 MED ORDER — ONDANSETRON HCL 4 MG/2ML IJ SOLN
INTRAMUSCULAR | Status: DC | PRN
  Administered 2017-04-03: 4 mg via INTRAVENOUS

## 2017-04-03 MED ORDER — FENTANYL CITRATE (PF) 100 MCG/2ML IJ SOLN
INTRAMUSCULAR | Status: AC
  Filled 2017-04-03: qty 2

## 2017-04-03 MED ORDER — FENTANYL CITRATE (PF) 100 MCG/2ML IJ SOLN
25.0000 ug | INTRAMUSCULAR | Status: DC | PRN
  Administered 2017-04-03 (×2): 25 ug via INTRAVENOUS

## 2017-04-03 MED ORDER — MIDODRINE HCL 5 MG PO TABS: 10.0000 mg | ORAL_TABLET | ORAL | Status: DC

## 2017-04-03 MED ORDER — ONDANSETRON HCL 4 MG/2ML IJ SOLN
4.0000 mg | Freq: Four times a day (QID) | INTRAMUSCULAR | Status: DC | PRN
  Administered 2017-04-12: 4 mg via INTRAVENOUS
  Filled 2017-04-03: qty 2

## 2017-04-03 MED ORDER — ACETAMINOPHEN 650 MG RE SUPP: 650.0000 mg | Freq: Four times a day (QID) | RECTAL | Status: DC | PRN

## 2017-04-03 MED ORDER — METOCLOPRAMIDE HCL 5 MG/ML IJ SOLN
5.0000 mg | Freq: Three times a day (TID) | INTRAMUSCULAR | Status: DC | PRN
Start: 2017-04-03 — End: 2017-04-08

## 2017-04-03 MED ORDER — FENTANYL CITRATE (PF) 250 MCG/5ML IJ SOLN
INTRAMUSCULAR | Status: AC
  Filled 2017-04-03: qty 5

## 2017-04-03 MED ORDER — CEFAZOLIN SODIUM-DEXTROSE 2-4 GM/100ML-% IV SOLN: 2.0000 g | INTRAVENOUS | Status: DC

## 2017-04-03 MED ORDER — PROPOFOL 10 MG/ML IV BOLUS
INTRAVENOUS | Status: DC | PRN
  Administered 2017-04-03: 40 mg via INTRAVENOUS

## 2017-04-03 MED ORDER — FENTANYL CITRATE (PF) 100 MCG/2ML IJ SOLN
INTRAMUSCULAR | Status: DC | PRN
  Administered 2017-04-03: 50 ug via INTRAVENOUS
  Administered 2017-04-03: 25 ug via INTRAVENOUS
  Administered 2017-04-03: 50 ug via INTRAVENOUS

## 2017-04-03 MED ORDER — DOCUSATE SODIUM 100 MG PO CAPS
100.0000 mg | ORAL_CAPSULE | Freq: Two times a day (BID) | ORAL | Status: DC
  Administered 2017-04-03 – 2017-04-29 (×20): 100 mg via ORAL
  Filled 2017-04-03 (×33): qty 1

## 2017-04-03 MED ORDER — METOCLOPRAMIDE HCL 5 MG PO TABS: 5.0000 mg | ORAL_TABLET | Freq: Three times a day (TID) | ORAL | Status: DC | PRN

## 2017-04-03 MED ORDER — SODIUM CHLORIDE 0.9 % IV SOLN: INTRAVENOUS | Status: DC

## 2017-04-03 MED ORDER — SODIUM CHLORIDE 0.9 % IV SOLN
INTRAVENOUS | Status: DC | PRN
  Administered 2017-04-03: 16:00:00 via INTRAVENOUS

## 2017-04-03 MED ORDER — 0.9 % SODIUM CHLORIDE (POUR BTL) OPTIME: TOPICAL | Status: DC | PRN

## 2017-04-03 MED ORDER — METHOCARBAMOL 1000 MG/10ML IJ SOLN
500.0000 mg | Freq: Four times a day (QID) | INTRAVENOUS | Status: DC | PRN
  Filled 2017-04-03: qty 5

## 2017-04-03 MED ORDER — HYDROMORPHONE HCL 1 MG/ML IJ SOLN: 1.0000 mg | INTRAMUSCULAR | Status: DC | PRN

## 2017-04-03 SURGICAL SUPPLY — 36 items
BLADE SAW RECIP 87.9 MT (BLADE) ×3 IMPLANT
BLADE SURG 21 STRL SS (BLADE) ×3 IMPLANT
CANISTER WOUND CARE 500ML ATS (WOUND CARE) ×3 IMPLANT
COVER SURGICAL LIGHT HANDLE (MISCELLANEOUS) ×3 IMPLANT
DRAPE EXTREMITY T 121X128X90 (DRAPE) ×3 IMPLANT
DRAPE HALF SHEET 40X57 (DRAPES) ×3 IMPLANT
DRAPE INCISE IOBAN 66X45 STRL (DRAPES) IMPLANT
DRAPE U-SHAPE 47X51 STRL (DRAPES) ×6 IMPLANT
DRESSING PREVENA PLUS CUSTOM (GAUZE/BANDAGES/DRESSINGS) ×1 IMPLANT
DRSG PREVENA PLUS CUSTOM (GAUZE/BANDAGES/DRESSINGS) ×3
DRSG VAC ATS MED SENSATRAC (GAUZE/BANDAGES/DRESSINGS) ×3 IMPLANT
DURAPREP 26ML APPLICATOR (WOUND CARE) ×3 IMPLANT
ELECT REM PT RETURN 9FT ADLT (ELECTROSURGICAL) ×3
ELECTRODE REM PT RTRN 9FT ADLT (ELECTROSURGICAL) ×1 IMPLANT
GLOVE BIOGEL PI IND STRL 9 (GLOVE) ×1 IMPLANT
GLOVE BIOGEL PI INDICATOR 9 (GLOVE) ×2
GLOVE SURG ORTHO 9.0 STRL STRW (GLOVE) ×3 IMPLANT
GOWN STRL REUS W/ TWL LRG LVL3 (GOWN DISPOSABLE) ×1 IMPLANT
GOWN STRL REUS W/ TWL XL LVL3 (GOWN DISPOSABLE) ×1 IMPLANT
GOWN STRL REUS W/TWL LRG LVL3 (GOWN DISPOSABLE) ×2
GOWN STRL REUS W/TWL XL LVL3 (GOWN DISPOSABLE) ×2
KIT BASIN OR (CUSTOM PROCEDURE TRAY) ×3 IMPLANT
KIT PREVENA INCISION MGT20CM45 (CANNISTER) ×3 IMPLANT
KIT ROOM TURNOVER OR (KITS) ×3 IMPLANT
MANIFOLD NEPTUNE II (INSTRUMENTS) IMPLANT
NS IRRIG 1000ML POUR BTL (IV SOLUTION) ×3 IMPLANT
PACK GENERAL/GYN (CUSTOM PROCEDURE TRAY) ×3 IMPLANT
PAD ARMBOARD 7.5X6 YLW CONV (MISCELLANEOUS) ×3 IMPLANT
PAD NEG PRESSURE SENSATRAC (MISCELLANEOUS) IMPLANT
SPONGE DRAIN TRACH 4X4 STRL 2S (GAUZE/BANDAGES/DRESSINGS) ×3 IMPLANT
STAPLER VISISTAT 35W (STAPLE) ×3 IMPLANT
SUT ETHILON 2 0 PSLX (SUTURE) ×12 IMPLANT
SUT ETHILON 3 0 FSL (SUTURE) ×6 IMPLANT
SUT SILK 2 0 (SUTURE) ×2
SUT SILK 2-0 18XBRD TIE 12 (SUTURE) ×1 IMPLANT
TOWEL OR 17X26 10 PK STRL BLUE (TOWEL DISPOSABLE) ×3 IMPLANT

## 2017-04-03 NOTE — Progress Notes (Signed)
SLP Cancellation Note  Patient Details Name: [name] MRN: [id number] DOB: 1981/04/30   Cancelled treatment:       Reason Eval/Treat Not Completed: Patient at procedure or test/unavailable. NPO for procedure today.    Leisl Spurrier, [name] 04/03/2017, 2:19 PM

## 2017-04-03 NOTE — H&P (View-Only) (Signed)
Patient ID: [name], female   DOB: 06/01/1981, 35 y.o.   MRN: [id number] Patient is status post left above-knee amputation and has also developed a decubitus ulcer right heel.  Examination patient has central breakdown of the incision. There is no purulence no drainage no cellulitis no signs of abscess. Patient does have a wound which is approximately 2 cm in diameter and there is exposed femur. The remainder of the surgical incision is well healed. Examination of the right foot she does have a thin black eschar on the heel with a decubitus ulcer. There is no drainage no cellulitis the eschar seems very superficial.  Continue with the foam boot for the right leg and continue with the Mepilex dressing changes. Patient will require revision of the left above-knee amputation. I have spoken with her mother and we'll plan to proceed with revision surgery on Friday afternoon. Nothing by mouth after midnight Thursday. Discussed the importance with her mother regarding nutrition intake for wound healing. 

## 2017-04-03 NOTE — Anesthesia Preprocedure Evaluation (Addendum)
Anesthesia Evaluation  Patient identified by MRN, date of birth, ID band Patient awake    Reviewed: Allergy & Precautions, NPO status , Patient's Chart, lab work & pertinent test results  History of Anesthesia Complications (+) PONV and history of anesthetic complications  Airway Mallampati: Trach       Dental no notable dental hx. (+) Teeth Intact   Pulmonary shortness of breath, with exertion and at rest, asthma , pneumonia, resolved, COPD,  COPD inhaler,    Pulmonary exam normal breath sounds clear to auscultation       Cardiovascular hypertension, Pt. on medications and Pt. on home beta blockers + CAD, + Past MI, + Peripheral Vascular Disease and +CHF  Normal cardiovascular exam Rhythm:Regular Rate:Normal     Neuro/Psych  Headaches, Hx/o anoxic encephalopathy negative psych ROS   GI/Hepatic Neg liver ROS,   Endo/Other  diabetes, Well Controlled, Type 2Hyperlipidemia  Renal/GU ESRF and DialysisRenal disease  negative genitourinary   Musculoskeletal  (+) Arthritis , Osteoarthritis,    Abdominal (+) - obese,   Peds  Hematology  (+) anemia ,   Anesthesia Other Findings   Reproductive/Obstetrics                             Anesthesia Physical Anesthesia Plan  ASA: IV  Anesthesia Plan: General   Post-op Pain Management:    Induction: Intravenous  PONV Risk Score and Plan: 4 or greater and Ondansetron, Dexamethasone, Propofol and Midazolam  Airway Management Planned: Tracheostomy  Additional Equipment:   Intra-op Plan:   Post-operative Plan:   Informed Consent: I have reviewed the patients History and Physical, chart, labs and discussed the procedure including the risks, benefits and alternatives for the proposed anesthesia with the patient or authorized representative who has indicated his/her understanding and acceptance.     Plan Discussed with: CRNA, Anesthesiologist  and Surgeon  Anesthesia Plan Comments:         Anesthesia Quick Evaluation

## 2017-04-03 NOTE — Transfer of Care (Signed)
Immediate Anesthesia Transfer of Care Note  Patient: [name]  Procedure(s) Performed: Procedure(s): Revision Left Above Knee Amputation (Left) Application of Wound Vacuum (Left)  Patient Location: PACU  Anesthesia Type:General  Level of Consciousness: awake and alert   Airway & Oxygen Therapy: Patient Spontanous Breathing and Patient connected to tracheostomy mask oxygen  Post-op Assessment: Report given to RN and Post -op Vital signs reviewed and stable  Post vital signs: Reviewed and stable  Last Vitals:  Vitals:   04/03/17 1200 04/03/17 1708  BP: 119/80 (!) 81/60  Pulse: (!) 103 91  Resp: (!) 26 20  Temp:  (!) 36.3 C    Last Pain:  Vitals:   04/03/17 1148  TempSrc: Axillary  PainSc:          Complications: No apparent anesthesia complications

## 2017-04-03 NOTE — Interval H&P Note (Signed)
History and Physical Interval Note:  04/03/2017 6:48 AM  [name]  has presented today for surgery, with the diagnosis of Dehiscence Left Above Knee Amputation  The various methods of treatment have been discussed with the patient and family. After consideration of risks, benefits and other options for treatment, the patient has consented to  Procedure(s): Revision Left Above Knee Amputation (Left) as a surgical intervention .  The patient's history has been reviewed, patient examined, no change in status, stable for surgery.  I have reviewed the patient's chart and labs.  Questions were answered to the patient's satisfaction.     Nadara Mustard

## 2017-04-03 NOTE — Progress Notes (Signed)
[facility] Rounding Note  Subjective:  For amputation revision today Interacts, tiny little head nods in response to questions NPO for surgery Had HD yesterday - hypotension mitigated to some degree by midodrine  Vital signs in last 24 hours: Vitals:   04/02/17 2330 04/03/17 0345 04/03/17 0747 04/03/17 1000  BP: 115/68 118/75 119/78 111/82  Pulse: (!) 103 (!) 109 (!) 105 (!) 105  Resp: (!) 25 (!) 21 (!) 22 (!) 24  Temp: 98 F (36.7 C) 98.2 F (36.8 C) 97.9 F (36.6 C)   TempSrc: Oral Oral Oral   SpO2: 98% 99% 98% 97%  Weight:  74.5 kg (164 lb 3.9 oz)    Height:       Weight change: 0 kg (0 lb)  Intake/Output Summary (Last 24 hours) at 04/03/17 1141 Last data filed at 04/03/17 0600  Gross per 24 hour  Intake              550 ml  Output              507 ml  Net               43 ml   Physical Exam: Mom in the room with her Trach/trach collar Looks at me, tiny nods in response to questions Anteriorly coarse BS but grossly clear S1S2 No S3 HR low 100's Abd obese, soft and non tender Left AKA with dressing in place not removed Right foot in protective boot (on prev exam was said to have new posterior heel ulcer, dry, no odor Dialysis Access: Left AVF + bruit and thrill  No labs today 7/6  Recent Labs Lab 03/31/17 0258 04/01/17 0241 04/02/17 0709  NA 139 138 141  K 4.5 3.4* 4.0  CL 96* 96* 98*  CO2 24 29 28   GLUCOSE 92 94 119*  BUN 78* 19 41*  CREATININE 7.05* 3.10* 4.94*  CALCIUM 9.1 8.9 8.9    Recent Labs Lab 03/28/17 0308 04/02/17 0709  AST 23 18  ALT 15 12*  ALKPHOS 290* 205*  BILITOT 1.4* 1.1  PROT 8.3* 7.0  ALBUMIN 3.0* 2.5*    Recent Labs Lab 03/29/17 0421 03/30/17 0227 03/31/17 0258 04/01/17 0241 04/02/17 0709  WBC 21.5* 15.7* 15.9* 16.0* 13.7*  HGB 8.7* 8.1* 8.3* 8.6* 8.7*  HCT 30.4* 28.4* 28.8* 30.8* 31.2*  MCV 98.7 96.9 96.6 97.8 100.3*  PLT 387 387 402* 401* 445*     Recent Labs Lab 04/02/17 1213 04/02/17 2020 04/02/17 2325  04/03/17 0349 04/03/17 0746  GLUCAP 139* 101* 230* 74 85    Medications: Infusions: . sodium chloride 10 mL (03/31/17 0901)  . piperacillin-tazobactam (ZOSYN)  IV 3.375 g (04/03/17 1119)    Scheduled Medications: . calcium carbonate (dosed in mg elemental calcium)  250 mg of elemental calcium Oral TID WC  . chlorhexidine gluconate (MEDLINE KIT)  15 mL Mouth Rinse BID  . darbepoetin (ARANESP) injection - DIALYSIS  200 mcg Intravenous Q Thu-HD  . dextrose  1 ampule Intravenous Once  . feeding supplement (NEPRO CARB STEADY)  237 mL Oral BID BM  . feeding supplement (PRO-STAT SUGAR FREE 64)  30 mL Oral BID  . [name]'s butt cream   Topical QID  . heparin  5,000 Units Subcutaneous Q8H  . insulin aspart  0-15 Units Subcutaneous Q4H  . insulin glargine  8 Units Subcutaneous Daily  . mouth rinse  15 mL Mouth Rinse QID  . midodrine  10 mg Oral Q T,Th,Sa-HD  . multivitamin  1 tablet Oral QHS  . pantoprazole sodium  40 mg Per Tube Q24H  . saccharomyces boulardii  250 mg Oral BID   Dialysis:  [facility] TTS 4h 32mn   F200   114kg   2/2 bath  LUA AVF  Hep none Iron Sucrose (Venofer) 50 mg IVP During Dialysis 1X Week  Mircera 75 mcg IV q  2 weeks ( last on 02/05/17) Vitamin D (Calcitriol) Oral 0.75 mcg  Po q hd    Summary: 36year old female w/ ESRD d/t poorly controlled DM, HD bilateral TMA's, obesity, HTN, medical non-compliance, NICM. Admitted 5/19 w/left leg pain, + radiological evidence of osteomyelitis w/wound nonhealing, dehiscence, purulent drainage. Admitted for IV antibiotics, hemodialysis and possible surgery. Found unresponsive in her room 5/19. Rec'd  CPR, epi bicarb; intubated; cooled. Now with anoxic brain injury, trach and s/p L AKA this hosp. Course further complicated by recurrent aspiration PNA,   Problems/Plans  1. Anoxic brain injury- progress since I last saw (01/2017) as is awake, follow commands, interacts, eats some, but issues with recurrent aspiration. Still with hands  mittened to avoid pulling things out. Very restless in HD requiring 1 on 1 care often during TMT. Not to point where she would be stable for OP HD 2. Trach - on trach collar. Has pulled out twice herself in past few days. Not sure what the plan is for decannulation. Not on vent since 6/20.  3. Fever/ PNA /^WBC - recurrent asp'n 6/28. Zosyn since 6/30. No CBC today 4. Hypotension/ tachycardia - Soft BP, volume status difficult to assess. Intolerant of standing doses of BB for tachycardia.  5. ESRD - on TTS HD, started HD June '17. HD hampered by hypotension. Added midodrine yesterday and was able to complete TMT. Cont TTS 6. Anemia- on max aranesp- hgb stable in 8's  7. MBD of CKD - Ca and Phos in range, on calc suspension. PTH 88 here.   8. HTN - soft BP's and tachy, no BP meds needed- on metoprolol prn only to keep hr under 120 9. PVD- s/p L AKA 03/13/17, VAC off 6/20. For revision today d/t exposed bone.  10. DM2 - per primary 11. sHF - EF 25-30% contributes to BP issues on HD 12. Nutrition- unclear if will be able to take enough in to meet needs, albumin 3.0 13. Dispo- difficult with ESRD and trach- [place] Medicaid so not sure LTAC is option.  Not stable enough for HD in the outpt setting as requires mostly 1 on 1 care right now to keep from dislodging needles.   CJamal Maes MD [facility] [phone] 04/03/2017, 11:41 AM

## 2017-04-03 NOTE — Progress Notes (Signed)
CSW continuing to follow- no options for this patient at this time due to continued dialysis and trach dependence  CSW will continue to follow  Burna Sis, LCSW Clinical Social Worker [phone]

## 2017-04-03 NOTE — Progress Notes (Signed)
[facility] PROGRESS NOTE  [name] QZR:[id number] DOB: [birth year] DOA: 02/14/2017  PCP: Vicenta Aly, FNP  Brief History/Interval Summary: Patient is a 36 y.o. female with history of ESRD on HD, poorly controlled diabetes, history of bilateral transmetatarsal foot amputation with subsequent left foot wound dehiscence (refused BKA as outpatient), chronic systolic heart failure felt to be secondary to nonischemic cardiomyopathy admitted on 02/14/17 with wound dehiscence and persistent drainage from the left trans-metatarsal amputation site. She was [facility] to the [facility]. She was subsequently found to be unresponsive in her room on 5/19 after getting IV Dilaudid-she was  in asystole, CPR was started, with ROSC in around 9 minutes.She was then transferred to the intensive care unit, and subsequently underwent cooling and then rewarming. She was unable to be liberated off the ventilator for several weeks , and as a result underwent tracheostomy on 5/31. Patient has been evaluated by neurology during this hospital stay, per neurology chances of meaningful neurological recovery was dismal. Hospital course has now been complicated by persistent leukocytosis, respiratory failure, and wound dehiscence with gangrene causing persistent SIRS pathophysiology and now suspected aspiration PNA. 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 Since then the patient has been weaned off the vent and maintained on trach collar. Low blood pressures have been an issue , especially with respect to her need for dialysis. Palliative medicine is following.   Consultants:   Infectious disease:  Neurology  Gastroenterology  Wound care  PCCM  Nephrology  Orthopedics  Procedures:   Transthoracic echocardiogram 5/20 Study Conclusions  - Left ventricle: The cavity size was normal. Wall thickness was   increased in a pattern of mild LVH. Systolic function was   moderately to severely reduced. The  estimated ejection fraction   was in the range of 30% to 35%. Diffuse hypokinesis. There is   akinesis of the basal-midinferior myocardium. Features are   consistent with a pseudonormal left ventricular filling pattern,   with concomitant abnormal relaxation and increased filling   pressure (grade 2 diastolic dysfunction). - Mitral valve: There was moderate regurgitation. Valve area by   pressure half-time: 2.44 cm^2. - Left atrium: The atrium was mildly dilated. - Right ventricle: The cavity size was mildly dilated. Wall   thickness was normal. - Tricuspid valve: There was mild regurgitation. - Pulmonary arteries: Systolic pressure was mildly increased. PA   peak pressure: 43 mm Hg (S). - Pericardium, extracardiac: A moderate pericardial effusion was   identified circumferential to the heart. . Largest pocket of   fluid is posterior to right atrium (2cm). No evidence of   tamponade physiology.  Transthoracic echocardiogram 6/28 Study Conclusions  - Left ventricle: The cavity size was normal. There was mild   concentric hypertrophy. Systolic function was severely reduced.   The estimated ejection fraction was in the range of 25% to 30%.   Diffuse hypokinesis. Regional wall motion abnormalities cannot be   excluded. - Mitral valve: There was mild regurgitation. - Pericardium, extracardiac: A trivial pericardial effusion was   identified circumferential to the heart.  Left above-knee amputation. 6/15  Antibiotics: She has previously completed multiple courses of antibiotics. Was started back on ceftazidime 6/29. Changed over to Zosyn to cover anaerobic organisms on 6/30.  Subjective/Interval History:  The patient is alert and awake , follows some commands , no major issues .  Objective:  Vital Signs  Vitals:   04/02/17 2100 04/02/17 2330 04/03/17 0345 04/03/17 0747  BP:  115/68 118/75 119/78  Pulse:  Marland Kitchen)  103 (!) 109 (!) 105  Resp:  (!) 25 (!) 21 (!) 22  Temp:  98 F  (36.7 C) 98.2 F (36.8 C) 97.9 F (36.6 C)  TempSrc:  Oral Oral Oral  SpO2: 99% 98% 99% 98%  Weight:   74.5 kg (164 lb 3.9 oz)   Height:        Intake/Output Summary (Last 24 hours) at 04/03/17 1033 Last data filed at 04/03/17 0600  Gross per 24 hour  Intake              550 ml  Output              507 ml  Net               43 ml   Filed Weights   04/02/17 1419 04/02/17 1900 04/03/17 0345  Weight: 74 kg (163 lb 2.3 oz) 73.5 kg (162 lb 0.6 oz) 74.5 kg (164 lb 3.9 oz)     General appearance: Alert and awake ,follows commands periodically. Marland Kitchen  Resp: CTAB , NO CRACKLES , no wheezes. Cardio: S1, S2 is normal, regular. No S3, S4. No rubs, murmurs or bruit GI: Abdomen is soft, nontender, nondistended. Bowel sounds are present. No masses or organomegaly Extremities: Left AKA. Wound dehiscence has been revised. Neurologic: Awake, alert. Does not speak. Moving all her extremities.  Lab Results:  Data Reviewed: I have personally reviewed following labs and imaging studies  CBC:  Recent Labs Lab 03/29/17 0421 03/30/17 0227 03/31/17 0258 04/01/17 0241 04/02/17 0709  WBC 21.5* 15.7* 15.9* 16.0* 13.7*  HGB 8.7* 8.1* 8.3* 8.6* 8.7*  HCT 30.4* 28.4* 28.8* 30.8* 31.2*  MCV 98.7 96.9 96.6 97.8 100.3*  PLT 387 387 402* 401* 445*    Basic Metabolic Panel:  Recent Labs Lab 03/29/17 0421 03/30/17 0227 03/31/17 0258 04/01/17 0241 04/02/17 0709  NA 138 138 139 138 141  K 4.2 4.4 4.5 3.4* 4.0  CL 94* 96* 96* 96* 98*  CO2 _0 GLUCOSE 112* 84 92 94 119*  BUN 60* 71* 78* 19 41*  CREATININE 5.29* 6.19* 7.05* 3.10* 4.94*  CALCIUM 9.3 9.0 9.1 8.9 8.9    GFR: Estimated Creatinine Clearance: 16 mL/min (A) (by C-G formula based on SCr of 4.94 mg/dL (H)).  Liver Function Tests:  Recent Labs Lab 03/28/17 0308 04/02/17 0709  AST 23 18  ALT 15 12*  ALKPHOS 290* 205*  BILITOT 1.4* 1.1  PROT 8.3* 7.0  ALBUMIN 3.0* 2.5*     CBG:  Recent Labs Lab  04/02/17 1213 04/02/17 2020 04/02/17 2325 04/03/17 0349 04/03/17 0746  GLUCAP 139* 101* 230* 74 85     Recent Results (from the past 240 hour(s))  Culture, blood (routine x 2)     Status: None   Collection Time: 03/26/17  2:31 PM  Result Value Ref Range Status   Specimen Description BLOOD LEFT ARM  Final   Special Requests IN PEDIATRIC BOTTLE Blood Culture adequate volume  Final   Culture NO GROWTH 5 DAYS  Final   Report Status 03/31/2017 FINAL  Final  Culture, blood (routine x 2)     Status: None   Collection Time: 03/26/17  2:31 PM  Result Value Ref Range Status   Specimen Description BLOOD RIGHT HAND  Final   Special Requests IN PEDIATRIC BOTTLE Blood Culture adequate volume  Final   Culture NO GROWTH 5 DAYS  Final   Report Status 03/31/2017 FINAL  Final  MRSA PCR Screening     Status: None   Collection Time: 03/27/17  8:09 AM  Result Value Ref Range Status   MRSA by PCR NEGATIVE NEGATIVE Final    Comment:        The GeneXpert MRSA Assay (FDA approved for NASAL specimens only), is one component of a comprehensive MRSA colonization surveillance program. It is not intended to diagnose MRSA infection nor to guide or monitor treatment for MRSA infections.       Radiology Studies: No results found.   Medications:  Scheduled: . calcium carbonate (dosed in mg elemental calcium)  250 mg of elemental calcium Oral TID WC  . chlorhexidine gluconate (MEDLINE KIT)  15 mL Mouth Rinse BID  . darbepoetin (ARANESP) injection - DIALYSIS  200 mcg Intravenous Q Thu-HD  . dextrose  1 ampule Intravenous Once  . feeding supplement (NEPRO CARB STEADY)  237 mL Oral BID BM  . feeding supplement (PRO-STAT SUGAR FREE 64)  30 mL Oral BID  . [name]'s butt cream   Topical QID  . heparin  5,000 Units Subcutaneous Q8H  . insulin aspart  0-15 Units Subcutaneous Q4H  . insulin glargine  8 Units Subcutaneous Daily  . mouth rinse  15 mL Mouth Rinse QID  . midodrine  10 mg Oral Q  T,Th,Sa-HD  . multivitamin  1 tablet Oral QHS  . pantoprazole sodium  40 mg Per Tube Q24H  . saccharomyces boulardii  250 mg Oral BID   Continuous: . sodium chloride 10 mL (03/31/17 0901)  . piperacillin-tazobactam (ZOSYN)  IV Stopped (04/03/17 0117)   TWK:MQKMMNOTRRNHA (TYLENOL) oral liquid 160 mg/5 mL, albuterol, fentaNYL (SUBLIMAZE) injection, loperamide, metoprolol tartrate, RESOURCE THICKENUP CLEAR  Assessment/Plan:  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)   Diabetic osteomyelitis (HCC)   Dehiscence of amputation stump (HCC)   Osteomyelitis (HCC)   Chronic pain syndrome   Cardiac arrest, cause unspecified ([place])   Pressure injury of skin   Wound dehiscence   Coma ([place])   Elevated liver enzymes   Jaundice   Encounter for central line placement   Protein calorie malnutrition ([place])   DNR (do not resuscitate) discussion   Palliative care by specialist   Tachycardia   Goals of care, counseling/discussion   Anoxic brain injury [facility])   Palliative care encounter   Tracheostomy status ([place])   Abscess of left leg   Hypoxemia   Fever   Aspiration into airway    Acute /chronic hypoxemic respiratory failure in a setting of cardiac arrest Continues to improve. Last ventilator use was on night of 6/19-6/20. On TC for several days. PCCM following for resp issues -tracheostomy in place. Change to #4 cuffless trach. S/P MBS 6/23. Now status post FEES 6/27. Patient started on nectar thick liquids and dysphagia 1 diet 6/27. Holding off on PEG for now. Cortrak removed 6/28. Unfortunately  patient has aspirated again. She was febrile and tachypneic. Repeat chest x-ray on 6/29 showed new right-sided infiltrate. She was started on [place] on 6/29 and changed over to Zosyn to provide anaerobic coverage. Seems to be stable. Chest  x-ray from 7/1 showed improving opacity in the right lung. I will repeat the CXR today .  Aspiration PNA : Improving , WCC is trending down .  Patient developed recurrent aspiration on 6/28-29. She was started back on antibiotics. She is improving. Heart rate is improved. Continue Zosyn for now. Initial infections were due to left foot gangrene and abscess. Now, she status post AKA. Blood cultures have been done multiple times and have been negative so far. She has had C. difficile testing, which was also been negative. CT scan did not show any PE. She's completed multiple courses of antibiotics. Most recently started on Fortaz on 6/29 and changed to Zosyn on 6/30.    ESRD On HD per Nephrology since June 17 , Tolerating it well, continue to follow up with Nephrology .  Left trans-metatarsal stump wound dehiscence with infection and gangrene Per operative note-pus/abscess noted extending up to the knee when BKA was attempted, subsequently underwent a AKA. Note, initially family was very reluctant to proceed with amputation, but given persistent leukocytosis/fever and potential risk of sepsis, family agreed for amputation, subsequently AKA was done on 6/15. Orthopedics removed wound VAC 6/20. Seen by orthopedics.revision of her left AKA happened yesterday , 04/02/17 Wound care nurse following for the pressure wound on the right heel.  Anoxic brain injury secondary to Cardiac arrest on 5/19 Patient has shown some cognitive improvement over the last many days. She has been noted to follow certain commands at times. Her prognosis remains guarded. She has been seen by neurology during this hospitalization, she continue   Diarrhea, continues to have loose  stools Continue probiotics and Imodium as needed. Try to discontinue Flexiseal.  Normocytic anemia Likely secondary to chronic disease-probably worsened by acute illness. No signs of bleeding, hemoglobin has been stable with some fluctuations.  Diabetes mellitus type 2  Continue with SSI. On Lantus. HbA1c 5.9.  Chronic systolic heart failure/nonischemic cardiomyopathy Volume was being managed with hemodialysis. Patient was placed on beta blocker, but she has not been able to due to low blood pressure. Coreg has been discontinued. Echocardiogram reports as above. EF 25-30% per last echocardiogram, remains compensated .  Pericardial effusion Seen on TEE-with no evidence of tamponade pathophysiology. Repeat echo 6/26 with no significant pericardial effusion.  Moderate protein calorie malnutrition Started orals 6/27. Aspiration precautions.   Goals of care Prognosis is guarded.  DVT Prophylaxis: Subcutaneous heparin    Code Status: Full code  Family Communication: Discussed with the mother  Disposition Plan: Management as outlined above.    Waldron Session  [facility] Pager [phone] 04/03/2017, 10:33 AM  If 7PM-7AM, please contact night-coverage at [email or web address], password [facility]

## 2017-04-03 NOTE — Progress Notes (Signed)
Physical Therapy Treatment Patient Details Name: [name] MRN: [id number] DOB: 01-21-1981 Today's Date: 04/03/2017    History of Present Illness [name] is a 36 y.o. 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 ETT 5/19 and trach'd 5/31.     PT Comments    Patient required mod A +2 for bed mobility. Focused on bilat LE therex. OR today for L AKA revision. Continue to progress as tolerated with anticipated d/c to SNF for further skilled PT services.    Follow Up Recommendations  SNF;Supervision/Assistance - 24 hour     Equipment Recommendations  Other (comment) (TBD)    Recommendations for Other Services       Precautions / Restrictions Precautions Precautions: Fall Precaution Comments: Trach Restrictions Weight Bearing Restrictions: Yes RLE Weight Bearing: Non weight bearing LLE Weight Bearing: Non weight bearing Other Position/Activity Restrictions: PRAFO boot on RLE    Mobility  Bed Mobility Overal bed mobility: Needs Assistance Bed Mobility: Rolling Rolling: Mod assist;+2 for physical assistance         General bed mobility comments: multimodal cues for sequencing; use of rail to maintain sidelying  Transfers                 General transfer comment: NT  Ambulation/Gait                 Stairs            Wheelchair Mobility    Modified Rankin (Stroke Patients Only)       Balance                                            Cognition Arousal/Alertness: Lethargic Behavior During Therapy: Flat affect Overall Cognitive Status: Difficult to assess Area of Impairment: Following commands;Problem solving;Attention                   Current Attention  Level: Sustained   Following Commands: Follows one step commands inconsistently;Follows one step commands with increased time     Problem Solving: Slow processing;Decreased initiation;Difficulty sequencing;Requires tactile cues;Requires verbal cues        Exercises General Exercises - Lower Extremity Hip ABduction/ADduction: AROM;AAROM;Both;10 reps;Supine;Sidelying Straight Leg Raises: AROM;AAROM;Both;5 reps;Supine Hip Flexion/Marching: AAROM;Right;10 reps;Supine    General Comments General comments (skin integrity, edema, etc.): mother present       Pertinent Vitals/Pain Pain Assessment: Faces Faces Pain Scale: Hurts even more Pain Location: R thigh with abduction Pain Descriptors / Indicators: Grimacing Pain Intervention(s): Limited activity within patient's tolerance;Monitored during session;Repositioned    Home Living                      Prior Function            PT Goals (current goals can now be found in the care plan section) Progress towards PT goals: Progressing toward goals    Frequency    Min 3X/week      PT Plan Current plan remains appropriate    Co-evaluation  AM-PAC PT "6 Clicks" Daily Activity  Outcome Measure  Difficulty turning over in bed (including adjusting bedclothes, sheets and blankets)?: Total Difficulty moving from lying on back to sitting on the side of the bed? : Total Difficulty sitting down on and standing up from a chair with arms (e.g., wheelchair, bedside commode, etc,.)?: Total Help needed moving to and from a bed to chair (including a wheelchair)?: Total Help needed walking in hospital room?: Total Help needed climbing 3-5 steps with a railing? : Total 6 Click Score: 6    End of Session Equipment Utilized During Treatment: Oxygen Activity Tolerance: Patient limited by fatigue;Patient limited by lethargy Patient left: in bed;with call bell/phone within reach;with family/visitor present;with  restraints reapplied (mitts reapplied) Nurse Communication: Mobility status;Need for lift equipment PT Visit Diagnosis: Muscle weakness (generalized) (M62.81);Pain;Other abnormalities of gait and mobility (R26.89) Pain - Right/Left: Right Pain - part of body: Ankle and joints of foot     Time: 0912-0935 PT Time Calculation (min) (ACUTE ONLY): 23 min  Charges:  $Therapeutic Exercise: 8-22 mins $Therapeutic Activity: 8-22 mins                    G Codes:       Erline Levine, PTA Pager: [phone]     Carolynne Edouard 04/03/2017, 1:21 PM

## 2017-04-03 NOTE — Anesthesia Procedure Notes (Signed)
Procedure Name: Intubation Date/Time: 04/03/2017 4:35 PM Performed by: Lewie Loron Pre-anesthesia Checklist: Patient identified, Emergency Drugs available, Suction available, Patient being monitored and Timeout performed Patient Re-evaluated:Patient Re-evaluated prior to inductionOxygen Delivery Method: Circle system utilized Preoxygenation: Pre-oxygenation with 100% oxygen Intubation Type: Inhalational induction and IV induction Tube size: 6.0 mm Number of attempts: 1 Placement Confirmation: breath sounds checked- equal and bilateral and positive ETCO2 Dental Injury: Teeth and Oropharynx as per pre-operative assessment  Comments: #6 cuffed ETT passed easily through 4 shiley trach stoma by Dr Gar Gibbon.  Zigmund Gottron, CRNA

## 2017-04-03 NOTE — Op Note (Signed)
02/14/2017 - 04/03/2017  4:54 PM  PATIENT:  [name]    PRE-OPERATIVE DIAGNOSIS:  Dehiscence Left Above Knee Amputation  POST-OPERATIVE DIAGNOSIS:  Same  PROCEDURE:  Revision Left Above Knee Amputation, Application of Wound Vacuum  SURGEON:  Nadara Mustard, MD  PHYSICIAN ASSISTANT:None ANESTHESIA:   General  PREOPERATIVE INDICATIONS:  [name] is a  36 y.o. female with a diagnosis of Dehiscence Left Above Knee Amputation who failed conservative measures and elected for surgical management.    The risks benefits and alternatives were discussed with the patient preoperatively including but not limited to the risks of infection, bleeding, nerve injury, cardiopulmonary complications, the need for revision surgery, among others, and the patient was willing to proceed.  OPERATIVE IMPLANTS: vac   OPERATIVE FINDINGS: Good petechial bleeding no abscess  OPERATIVE PROCEDURE: Patient brought the operating room and underwent a general anesthetic. After adequate levels anesthesia were obtained patient's left lower extremity was prepped using DuraPrep draped into a sterile field a timeout was called. A fishmouth incision was made around the dehisced above-knee amputation. Incisions were made down through healthy viable noninvolved tissue. Electrocautery was used for hemostasis. The medial vascular bundles were clamped with a hemostat and suture ligated with 2-0 silk. A reciprocating saw was used to resect approximately 3 cm of the distal femur. The wound and leg were resected in 1 block of tissue this did not contaminate the operative field. The wound was irrigated with normal saline wound was reapproximated using 2-0 nylon and staples. A Prevena plus wound VAC was applied to a vac ultra canister. This had a good suction fit. Patient was extubated taken to the PACU in stable condition.

## 2017-04-04 LAB — CBC
HEMATOCRIT: 29.8 % — AB (ref 36.0–46.0)
Hemoglobin: 8.5 g/dL — ABNORMAL LOW (ref 12.0–15.0)
MCH: 28.6 pg (ref 26.0–34.0)
MCHC: 28.5 g/dL — ABNORMAL LOW (ref 30.0–36.0)
MCV: 100.3 fL — ABNORMAL HIGH (ref 78.0–100.0)
PLATELETS: 489 10*3/uL — AB (ref 150–400)
RBC: 2.97 MIL/uL — ABNORMAL LOW (ref 3.87–5.11)
RDW: 17.9 % — ABNORMAL HIGH (ref 11.5–15.5)
WBC: 12.5 10*3/uL — AB (ref 4.0–10.5)

## 2017-04-04 LAB — GLUCOSE, CAPILLARY
GLUCOSE-CAPILLARY: 99 mg/dL (ref 65–99)
GLUCOSE-CAPILLARY: 193 mg/dL — AB (ref 65–99)
Glucose-Capillary: 122 mg/dL — ABNORMAL HIGH (ref 65–99)
Glucose-Capillary: 85 mg/dL (ref 65–99)
Glucose-Capillary: 132 mg/dL — ABNORMAL HIGH (ref 65–99)

## 2017-04-04 LAB — RENAL FUNCTION PANEL
ALBUMIN: 2.7 g/dL — AB (ref 3.5–5.0)
Anion gap: 14 (ref 5–15)
BUN: 22 mg/dL — ABNORMAL HIGH (ref 6–20)
CO2: 26 mmol/L (ref 22–32)
CREATININE: 3.86 mg/dL — AB (ref 0.44–1.00)
Calcium: 8.8 mg/dL — ABNORMAL LOW (ref 8.9–10.3)
Chloride: 97 mmol/L — ABNORMAL LOW (ref 101–111)
GFR calc Af Amer: 16 mL/min — ABNORMAL LOW (ref >60)
GFR, EST NON AFRICAN AMERICAN: 14 mL/min — AB (ref >60)
Glucose, Bld: 95 mg/dL (ref 65–99)
PHOSPHORUS: 5.5 mg/dL — AB (ref 2.5–4.6)
Potassium: 4.1 mmol/L (ref 3.5–5.1)
Sodium: 137 mmol/L (ref 135–145)

## 2017-04-04 MED ORDER — MIDODRINE HCL 5 MG PO TABS
10.0000 mg | ORAL_TABLET | ORAL | Status: DC
  Administered 2017-04-04 – 2017-04-11 (×5): 10 mg via ORAL
  Filled 2017-04-04 (×6): qty 2

## 2017-04-04 MED ORDER — MIDODRINE HCL 5 MG PO TABS: 10.0000 mg | ORAL_TABLET | ORAL | Status: DC

## 2017-04-04 NOTE — Procedures (Signed)
I have personally attended this patient's dialysis session.   L AVF no cannulation issues but won't keep arm still Rec'd midodrine pre HD 2K bath K 4.1 UF goal 1.5 liters Current BP 108/57  Camille Bal, MD [facility] [phone] Pager 04/04/2017, 11:46 AM

## 2017-04-04 NOTE — Progress Notes (Signed)
SLP Cancellation Note  Patient Details Name: Karthika Molyneux MRN: [id number] DOB: [birth year]   Cancelled treatment:       Reason Eval/Treat Not Completed: Patient at procedure or test/unavailable. Patient was in HD.    Angela Nevin, MA, CCC-SLP 04/04/17 4:09 PM

## 2017-04-04 NOTE — Progress Notes (Signed)
[facility] PROGRESS NOTE  [name] KTG:[id number] DOB: May 25, 1981 DOA: 02/14/2017  PCP: Vicenta Aly, FNP  Brief History/Interval Summary: Patient is a 36 y.o. female with history of ESRD on HD, poorly controlled diabetes, history of bilateral transmetatarsal foot amputation with subsequent left foot wound dehiscence (refused BKA as outpatient), chronic systolic heart failure felt to be secondary to nonischemic cardiomyopathy admitted on 02/14/17 with wound dehiscence and persistent drainage from the left trans-metatarsal amputation site. She was admitted to the [facility]. She was subsequently found to be unresponsive in her room on 5/19 after getting IV Dilaudid-she was  in asystole, CPR was started, with ROSC in around 9 minutes.She was then transferred to the intensive care unit, and subsequently underwent cooling and then rewarming. She was unable to be liberated off the ventilator for several weeks , and as a result underwent tracheostomy on 5/31. Patient has been evaluated by neurology during this hospital stay, per neurology chances of meaningful neurological recovery was dismal. Hospital course has now been complicated by persistent leukocytosis, respiratory failure, and wound dehiscence with gangrene causing persistent SIRS pathophysiology and now suspected aspiration PNA. 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 Since then the patient has been weaned off the vent and maintained on trach collar. Low blood pressures have been an issue , especially with respect to her need for dialysis. Palliative medicine is following.   Consultants:   Infectious disease:  Neurology  Gastroenterology  Wound care  PCCM  Nephrology  Orthopedics  Procedures:   Transthoracic echocardiogram 5/20 Study Conclusions  - Left ventricle: The cavity size was normal. Wall thickness was   increased in a pattern of mild LVH. Systolic function was   moderately to severely reduced. The  estimated ejection fraction   was in the range of 30% to 35%. Diffuse hypokinesis. There is   akinesis of the basal-midinferior myocardium. Features are   consistent with a pseudonormal left ventricular filling pattern,   with concomitant abnormal relaxation and increased filling   pressure (grade 2 diastolic dysfunction). - Mitral valve: There was moderate regurgitation. Valve area by   pressure half-time: 2.44 cm^2. - Left atrium: The atrium was mildly dilated. - Right ventricle: The cavity size was mildly dilated. Wall   thickness was normal. - Tricuspid valve: There was mild regurgitation. - Pulmonary arteries: Systolic pressure was mildly increased. PA   peak pressure: 43 mm Hg (S). - Pericardium, extracardiac: A moderate pericardial effusion was   identified circumferential to the heart. . Largest pocket of   fluid is posterior to right atrium (2cm). No evidence of   tamponade physiology.  Transthoracic echocardiogram 6/28 Study Conclusions  - Left ventricle: The cavity size was normal. There was mild   concentric hypertrophy. Systolic function was severely reduced.   The estimated ejection fraction was in the range of 25% to 30%.   Diffuse hypokinesis. Regional wall motion abnormalities cannot be   excluded. - Mitral valve: There was mild regurgitation. - Pericardium, extracardiac: A trivial pericardial effusion was   identified circumferential to the heart.  Left above-knee amputation. 6/15  Antibiotics: She has previously completed multiple courses of antibiotics. Was started back on ceftazidime 6/29. Changed over to Zosyn to cover anaerobic organisms on 6/30.  Subjective/Interval History:  The patient is alert and awake , follows some commands , no major issues .  Objective:  Vital Signs  Vitals:   04/04/17 1044 04/04/17 1100 04/04/17 1129 04/04/17 1145  BP: (!) 105/58 131/77 96/70 (!) 108/57  Pulse: (!) 107 (!) 109 85 (!) 116  Resp: (!) 22 (!) 22 (!) 24  (!) 22  Temp:      TempSrc:      SpO2: 100%   100%  Weight:      Height:        Intake/Output Summary (Last 24 hours) at 04/04/17 1158 Last data filed at 04/04/17 0800  Gross per 24 hour  Intake              810 ml  Output               51 ml  Net              759 ml   Filed Weights   04/03/17 0345 04/04/17 0406 04/04/17 0915  Weight: 74.5 kg (164 lb 3.9 oz) 81 kg (178 lb 9.2 oz) 83 kg (182 lb 15.7 oz)     General appearance: Alert and awake ,follows commands periodically. Marland Kitchen  Resp: Clear to auscultation bilaterally. Cardio: S1, S2 is normal, regular. No S3, S4. No rubs, murmurs or bruit GI: Abdomen is soft, nontender, nondistended. Bowel sounds are present. No masses or organomegaly Extremities: Left AKA. Wound dehiscence has been revised. Neurologic: Awake, alert. Aphasic. Moving all her extremities.  Lab Results:  Data Reviewed: I have personally reviewed following labs and imaging studies  CBC:  Recent Labs Lab 03/30/17 0227 03/31/17 0258 04/01/17 0241 04/02/17 0709 04/04/17 0417  WBC 15.7* 15.9* 16.0* 13.7* 12.5*  HGB 8.1* 8.3* 8.6* 8.7* 8.5*  HCT 28.4* 28.8* 30.8* 31.2* 29.8*  MCV 96.9 96.6 97.8 100.3* 100.3*  PLT 387 402* 401* 445* 489*    Basic Metabolic Panel:  Recent Labs Lab 03/30/17 0227 03/31/17 0258 04/01/17 0241 04/02/17 0709 04/04/17 0417  NA 138 139 138 141 137  K 4.4 4.5 3.4* 4.0 4.1  CL 96* 96* 96* 98* 97*  CO2 26 24 29 28 26   GLUCOSE 84 92 94 119* 95  BUN 71* 78* 19 41* 22*  CREATININE 6.19* 7.05* 3.10* 4.94* 3.86*  CALCIUM 9.0 9.1 8.9 8.9 8.8*  PHOS  --   --   --   --  5.5*    GFR: Estimated Creatinine Clearance: 22.8 mL/min (A) (by C-G formula based on SCr of 3.86 mg/dL (H)).  Liver Function Tests:  Recent Labs Lab 04/02/17 0709 04/04/17 0417  AST 18  --   ALT 12*  --   ALKPHOS 205*  --   BILITOT 1.1  --   PROT 7.0  --   ALBUMIN 2.5* 2.7*     CBG:  Recent Labs Lab 04/03/17 1814 04/03/17 2001 04/04/17 0001  04/04/17 0756 04/04/17 1013  GLUCAP 95 104* 177* 122* 85     Recent Results (from the past 240 hour(s))  Culture, blood (routine x 2)     Status: None   Collection Time: 03/26/17  2:31 PM  Result Value Ref Range Status   Specimen Description BLOOD LEFT ARM  Final   Special Requests IN PEDIATRIC BOTTLE Blood Culture adequate volume  Final   Culture NO GROWTH 5 DAYS  Final   Report Status 03/31/2017 FINAL  Final  Culture, blood (routine x 2)     Status: None   Collection Time: 03/26/17  2:31 PM  Result Value Ref Range Status   Specimen Description BLOOD RIGHT HAND  Final   Special Requests IN PEDIATRIC BOTTLE Blood Culture adequate volume  Final   Culture NO GROWTH 5 DAYS  Final   Report Status 03/31/2017 FINAL  Final  MRSA PCR Screening     Status: None   Collection Time: 03/27/17  8:09 AM  Result Value Ref Range Status   MRSA by PCR NEGATIVE NEGATIVE Final    Comment:        The GeneXpert MRSA Assay (FDA approved for NASAL specimens only), is one component of a comprehensive MRSA colonization surveillance program. It is not intended to diagnose MRSA infection nor to guide or monitor treatment for MRSA infections.       Radiology Studies: No results found.   Medications:  Scheduled: . calcium carbonate (dosed in mg elemental calcium)  250 mg of elemental calcium Oral TID WC  . chlorhexidine gluconate (MEDLINE KIT)  15 mL Mouth Rinse BID  . darbepoetin (ARANESP) injection - DIALYSIS  200 mcg Intravenous Q Thu-HD  . dextrose  1 ampule Intravenous Once  . docusate sodium  100 mg Oral BID  . feeding supplement (NEPRO CARB STEADY)  237 mL Oral BID BM  . feeding supplement (PRO-STAT SUGAR FREE 64)  30 mL Oral BID  . [name]'s butt cream   Topical QID  . heparin  5,000 Units Subcutaneous Q8H  . insulin aspart  0-15 Units Subcutaneous Q4H  . insulin glargine  8 Units Subcutaneous Daily  . mouth rinse  15 mL Mouth Rinse QID  . midodrine  10 mg Oral Q T,Th,Sa-HD  .  multivitamin  1 tablet Oral QHS  . pantoprazole sodium  40 mg Per Tube Q24H  . saccharomyces boulardii  250 mg Oral BID   Continuous: . sodium chloride 10 mL (03/31/17 0901)  . sodium chloride    . methocarbamol (ROBAXIN)  IV    . 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)   Normocytic anemia   Hyperlipidemia   Hypertension   Non-ischemic cardiomyopathy - by echo 8/16- EF 35-40%   Morbid obesity-    Acute combined systolic and diastolic heart failure (HCC)   ESRD (end stage renal disease) ([place])   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)   Diabetic osteomyelitis (HCC)   Dehiscence of amputation stump (HCC)   Osteomyelitis (HCC)   Chronic pain syndrome   Cardiac arrest, cause unspecified (HCC)   Pressure injury of skin   Wound dehiscence   Coma ([place])   Elevated liver enzymes   Jaundice   Encounter for central line placement   Protein calorie malnutrition ([place])   DNR (do not resuscitate) discussion   Palliative care by specialist   Tachycardia   Goals of care, counseling/discussion   Anoxic brain injury [facility])   Palliative care encounter   Tracheostomy status ([place])   Abscess of left leg   Hypoxemia   Fever   Aspiration into airway    Acute /chronic hypoxemic respiratory failure in a setting of cardiac arrest Continues to improve. Last ventilator use was on night of 6/19-6/20. On TC for several  days. PCCM following for resp issues -tracheostomy in place. Change to #4 cuffless trach. S/P MBS 6/23. Now status post FEES 6/27. Patient started on nectar thick liquids and  dysphagia 1 diet 6/27. Holding off on PEG for now. Cortrak removed 6/28. Unfortunately patient has aspirated again. She was febrile and tachypneic. Repeat chest x-ray on 6/29 showed new right-sided infiltrate. She was started on [place] on 6/29 and changed over to Zosyn to provide anaerobic coverage. Seems to be stable. Chest x-ray from 7/1 showed improving opacity in the right lung, will stop the Abx on 07/10 if ok with ID.  Aspiration PNA : Improving , WCC is trending down .  Patient developed recurrent aspiration on 6/28-29. She was started back on antibiotics. She is improving. Heart rate is improved. Continue Zosyn for now. Initial infections were due to left foot gangrene and abscess. Now, she status post AKA. Blood cultures have been done multiple times and have been negative so far. She has had C. difficile testing, which was also been negative. CT scan did not show any PE. She's completed multiple courses of antibiotics. Most recently started on Fortaz on 6/29 and changed to Zosyn on 6/30.    ESRD On HD per Nephrology since June 17 , Tolerating it well, continue to follow up with Nephrology ,due for HD today .  Left trans-metatarsal stump wound dehiscence with infection and gangrene Per operative note-pus/abscess noted extending up to the knee when BKA was attempted, subsequently underwent a AKA. Note, initially family was very reluctant to proceed with amputation, but given persistent leukocytosis/fever and potential risk of sepsis, family agreed for amputation, subsequently AKA was done on 6/15. Orthopedics removed wound VAC 6/20. Seen by orthopedics.revision of her left AKA happened 04/02/17 , 04/02/17 Wound care nurse following for the pressure wound on the right heel.  Anoxic brain injury secondary to Cardiac arrest  on 5/19 Patient has shown some cognitive improvement over the last many days. She has been noted to follow certain commands at times. Her prognosis remains guarded. She has been seen by neurology during this hospitalization, she continues to show sign of improvement .  Diarrhea, continues to have loose stools Continue probiotics and Imodium as needed. Try to discontinue Flexiseal.  Normocytic anemia Likely secondary to chronic disease-probably worsened by acute illness. No signs of bleeding, hemoglobin has been stable with some fluctuations.  Diabetes mellitus type 2  Continue with SSI. On Lantus. HbA1c 5.9.  Chronic systolic heart failure/nonischemic cardiomyopathy Volume was being managed with hemodialysis. Patient was placed on beta blocker, but she has not been able to due to low blood pressure. Coreg has been discontinued. Echocardiogram reports as above. EF 25-30% per last echocardiogram, remains compensated .  Pericardial effusion Seen on TEE-with no evidence of tamponade pathophysiology. Repeat echo 6/26 with no significant pericardial effusion.  Moderate protein calorie malnutrition Started orals 6/27. Aspiration precautions.   Goals of care Prognosis is guarded.  DVT Prophylaxis: Subcutaneous heparin    Code Status: Full code  Family Communication: Discussed with the mother  Disposition Plan: Management as outlined above.    Taimi Towe,MD .  [facility] Pager [phone] 04/04/2017, 11:58 AM  If 7PM-7AM, please contact night-coverage at [email or web address], password [facility]

## 2017-04-04 NOTE — Progress Notes (Signed)
Patient ID: [name], female   DOB: 1981-06-24, 36 y.o.   MRN: [id number] I spoke with the patient's mother she states that there were no problems overnight. Patient is seen in dialysis. There is less than 50 mL in the VAC canister. The wound VAC is functioning well

## 2017-04-04 NOTE — Anesthesia Postprocedure Evaluation (Signed)
Anesthesia Post Note  Patient: [name]  Procedure(s) Performed: Procedure(s) (LRB): Revision Left Above Knee Amputation (Left) Application of Wound Vacuum (Left)     Patient location during evaluation: PACU Anesthesia Type: General Level of consciousness: sedated and patient cooperative Pain management: pain level controlled Vital Signs Assessment: post-procedure vital signs reviewed and stable Respiratory status: spontaneous breathing Cardiovascular status: stable Anesthetic complications: no    Last Vitals:  Vitals:   04/04/17 0745 04/04/17 0758  BP: (!) 120/95 112/63  Pulse: (!) 118   Resp: (!) 23   Temp: 37 C     Last Pain:  Vitals:   04/04/17 0745  TempSrc: Oral  PainSc:                  Lewie Loron

## 2017-04-04 NOTE — Progress Notes (Signed)
[facility] Rounding Note  Subjective:  Had revision of L AKA with application wound VAC yesterday (7/6) Seen in HD Hard to keep her arm still - requiring a restraint as a reminder Hands mittened  Vital signs in last 24 hours: Vitals:   04/04/17 0930 04/04/17 1000 04/04/17 1031 04/04/17 1044  BP: 102/70 105/69 112/73 (!) 105/58  Pulse: (!) 119 (!) 113 (!) 109 (!) 107  Resp: (!) 27 (!) 21 (!) 22 (!) 22  Temp:      TempSrc:      SpO2:  100%  100%  Weight:      Height:       Weight change: 7 kg (15 lb 6.9 oz)  Intake/Output Summary (Last 24 hours) at 04/04/17 1124 Last data filed at 04/04/17 0800  Gross per 24 hour  Intake              810 ml  Output               51 ml  Net              759 ml   Physical Exam: Lying over to the right side on the bed Trach/trach collar Looks at me, tiny nods in response to questions Anteriorly coarse BS but grossly clear S1S2 No S3 HR 120 Abd obese, soft and non tender Left AKA with wound VAC in place Right foot in protective boot (on prev exam was said to have new posterior heel ulcer, dry, no odor Dialysis Access: Left AVF + bruit and thrill  - cannulated   Recent Labs Lab 04/01/17 0241 04/02/17 0709 04/04/17 0417  NA 138 141 137  K 3.4* 4.0 4.1  CL 96* 98* 97*  CO2 _0 GLUCOSE 94 119* 95  BUN 19 41* 22*  CREATININE 3.10* 4.94* 3.86*  CALCIUM 8.9 8.9 8.8*  PHOS  --   --  5.5*    Recent Labs Lab 04/02/17 0709 04/04/17 0417  AST 18  --   ALT 12*  --   ALKPHOS 205*  --   BILITOT 1.1  --   PROT 7.0  --   ALBUMIN 2.5* 2.7*    Recent Labs Lab 03/30/17 0227 03/31/17 0258 04/01/17 0241 04/02/17 0709 04/04/17 0417  WBC 15.7* 15.9* 16.0* 13.7* 12.5*  HGB 8.1* 8.3* 8.6* 8.7* 8.5*  HCT 28.4* 28.8* 30.8* 31.2* 29.8*  MCV 96.9 96.6 97.8 100.3* 100.3*  PLT 387 402* 401* 445* 489*     Recent Labs Lab 04/03/17 1814 04/03/17 2001 04/04/17 0001 04/04/17 0756 04/04/17 1013  GLUCAP 95 104* 177* 122* 85     Medications: Infusions: . sodium chloride 10 mL (03/31/17 0901)  . sodium chloride    . methocarbamol (ROBAXIN)  IV    . piperacillin-tazobactam (ZOSYN)  IV Stopped (04/04/17 0159)    Scheduled Medications: . calcium carbonate (dosed in mg elemental calcium)  250 mg of elemental calcium Oral TID WC  . chlorhexidine gluconate (MEDLINE KIT)  15 mL Mouth Rinse BID  . darbepoetin (ARANESP) injection - DIALYSIS  200 mcg Intravenous Q Thu-HD  . dextrose  1 ampule Intravenous Once  . docusate sodium  100 mg Oral BID  . feeding supplement (NEPRO CARB STEADY)  237 mL Oral BID BM  . feeding supplement (PRO-STAT SUGAR FREE 64)  30 mL Oral BID  . [name]'s butt cream   Topical QID  . heparin  5,000 Units Subcutaneous Q8H  . insulin aspart  0-15 Units Subcutaneous  Q4H  . insulin glargine  8 Units Subcutaneous Daily  . mouth rinse  15 mL Mouth Rinse QID  . midodrine  10 mg Oral Q T,Th,Sa-HD  . multivitamin  1 tablet Oral QHS  . pantoprazole sodium  40 mg Per Tube Q24H  . saccharomyces boulardii  250 mg Oral BID   Dialysis:  [facility] TTS 4h 93mn    F200    114kg    2/2 bath   LUA AVF   Hep none Iron Sucrose (Venofer) 50 mg IVP During Dialysis 1X Week  Mircera 75 mcg IV q  2 weeks ( last on 02/05/17) Vitamin D (Calcitriol) Oral 0.75 mcg  Po q hd    Summary: 36year old female w/ ESRD d/t poorly controlled DM, HD bilateral TMA's, obesity, HTN, medical non-compliance, NICM. Admitted 5/19 w/left leg pain, + radiological evidence of osteomyelitis w/wound nonhealing, dehiscence, purulent drainage. Admitted for IV antibiotics, hemodialysis and possible surgery. Found unresponsive in her room 5/19. Rec'd  CPR, epi bicarb; intubated; cooled. Now with anoxic brain injury, trach and s/p L AKA this hosp. Course further complicated by recurrent aspiration PNA, dehiscence of AK wound requiring revision 7/6.   Problems/Plans  1. Anoxic brain injury- progress since I last saw (01/2017) as is awake,  follow some commands, interacts, eats some, but issues with recurrent aspiration. Still with hands mittened to avoid pulling things out. Very restless in HD requiring 1 on 1 care often during TMT. Not to point where she would be stable for OP HD 2. Trach - on trach collar. Has pulled out twice herself in past week. Not sure what the plan is for decannulation. Not on vent since 6/20.  3. Fever/ PNA /^WBC - recurrent asp'n 6/28. Zosyn since 6/30.  4. Hypotension/ tachycardia - Soft BP, volume status difficult to assess. Intolerant of standing doses of BB for tachycardia.  5. ESRD -  TTS HD, started HD June '17. HD hampered by hypotension. Added midodrine which has mitigated hypotension to some degree 6. Anemia- on max aranesp- hgb stable in 8's  7. MBD of CKD - Ca and Phos in range, on calc suspension. PTH 88 here.   8. HTN - soft BP's and tachy, no BP meds needed- on metoprolol prn only to keep hr under 120 9. PVD- s/p L AKA 03/13/17, VAC off 6/20. For revision today d/t exposed bone.  10. DM2 - per primary 11. sHF - EF 25-30% contributes to BP issues on HD 12. Nutrition- unclear if will be able to take enough in to meet needs, albumin 3.0 13. Dispo- difficult with ESRD and trach- [place] Medicaid so not sure LTAC is option.  Not stable enough for HD in the outpt setting as requires mostly 1 on 1 care right now to keep from dislodging needles.   CJamal Maes MD [facility] [phone] 04/04/2017, 11:24 AM

## 2017-04-05 ENCOUNTER — Inpatient Hospital Stay (HOSPITAL_COMMUNITY): Payer: Medicaid Other

## 2017-04-05 ENCOUNTER — Encounter (HOSPITAL_COMMUNITY): Payer: Self-pay | Admitting: Orthopedic Surgery

## 2017-04-05 LAB — CBC
HCT: 29.5 % — ABNORMAL LOW (ref 36.0–46.0)
Hemoglobin: 8.4 g/dL — ABNORMAL LOW (ref 12.0–15.0)
MCH: 28.3 pg (ref 26.0–34.0)
MCHC: 28.5 g/dL — ABNORMAL LOW (ref 30.0–36.0)
MCV: 99.3 fL (ref 78.0–100.0)
PLATELETS: 459 10*3/uL — AB (ref 150–400)
RBC: 2.97 MIL/uL — AB (ref 3.87–5.11)
RDW: 18 % — ABNORMAL HIGH (ref 11.5–15.5)
WBC: 16.9 10*3/uL — ABNORMAL HIGH (ref 4.0–10.5)

## 2017-04-05 LAB — GLUCOSE, CAPILLARY
GLUCOSE-CAPILLARY: 131 mg/dL — AB (ref 65–99)
GLUCOSE-CAPILLARY: 208 mg/dL — AB (ref 65–99)
GLUCOSE-CAPILLARY: 259 mg/dL — AB (ref 65–99)
Glucose-Capillary: 131 mg/dL — ABNORMAL HIGH (ref 65–99)
Glucose-Capillary: 93 mg/dL (ref 65–99)
Glucose-Capillary: 102 mg/dL — ABNORMAL HIGH (ref 65–99)

## 2017-04-05 LAB — COMPREHENSIVE METABOLIC PANEL
ALT: 13 U/L — AB (ref 14–54)
AST: 19 U/L (ref 15–41)
Albumin: 2.4 g/dL — ABNORMAL LOW (ref 3.5–5.0)
Alkaline Phosphatase: 234 U/L — ABNORMAL HIGH (ref 38–126)
Anion gap: 13 (ref 5–15)
BILIRUBIN TOTAL: 1.2 mg/dL (ref 0.3–1.2)
BUN: 14 mg/dL (ref 6–20)
CALCIUM: 8.7 mg/dL — AB (ref 8.9–10.3)
CHLORIDE: 96 mmol/L — AB (ref 101–111)
CO2: 27 mmol/L (ref 22–32)
Creatinine, Ser: 2.83 mg/dL — ABNORMAL HIGH (ref 0.44–1.00)
GFR, EST AFRICAN AMERICAN: 23 mL/min — AB (ref >60)
GFR, EST NON AFRICAN AMERICAN: 20 mL/min — AB (ref >60)
Glucose, Bld: 132 mg/dL — ABNORMAL HIGH (ref 65–99)
Potassium: 3.8 mmol/L (ref 3.5–5.1)
Sodium: 136 mmol/L (ref 135–145)
TOTAL PROTEIN: 7 g/dL (ref 6.5–8.1)

## 2017-04-05 MED ORDER — HYDROMORPHONE HCL 1 MG/ML IJ SOLN
1.0000 mg | INTRAMUSCULAR | Status: DC | PRN
  Administered 2017-04-06: 1 mg via INTRAVENOUS
  Filled 2017-04-05: qty 1

## 2017-04-05 NOTE — Progress Notes (Signed)
[facility] Rounding Note  Subjective/Interval events:  S/p revision L AKA with application wound VAC (7/6) HD 7/7 - was hard to have her keep her arm still  Sleepier today but looks at me, follows commands Took meds this AM in applesauce  Vital signs in last 24 hours: Vitals:   04/05/17 0400 04/05/17 0426 04/05/17 0735 04/05/17 0749  BP: 105/63  (!) 98/57 106/73  Pulse: (!) 119  (!) 121 (!) 118  Resp: 17  16 18   Temp:  98.1 F (36.7 C)  98.7 F (37.1 C)  TempSrc:  Oral  Oral  SpO2: 100%  97% 100%  Weight:  79 kg (174 lb 2.6 oz)    Height:       Weight change: 2 kg (4 lb 6.6 oz)  Intake/Output Summary (Last 24 hours) at 04/05/17 0956 Last data filed at 04/05/17 7564  Gross per 24 hour  Intake              553 ml  Output             1525 ml  Net             -972 ml   Physical Exam: Lying over to the right side on the bed Trach/trach collar Looks at me, nods in response to questions - a little more tired, harder to engage than yesterday Lungs clear anteriorly S1S2 No S3  Persistent tachycardia 120's  Abd obese, soft and non tender Left AKA with wound VAC in place Right foot partial amputation - prevalon boot and foam  removed and heel ulcer with some skin necrosis, dry, no drainage  (RN says looks stable to her) Dialysis Access: Left AVF + bruit and thrill     Recent Labs Lab 04/02/17 0709 04/04/17 0417 04/05/17 0601  NA 141 137 136  K 4.0 4.1 3.8  CL 98* 97* 96*  CO2 28 26 27   GLUCOSE 119* 95 132*  BUN 41* 22* 14  CREATININE 4.94* 3.86* 2.83*  CALCIUM 8.9 8.8* 8.7*  PHOS  --  5.5*  --     Recent Labs Lab 04/02/17 0709 04/04/17 0417 04/05/17 0601  AST 18  --  19  ALT 12*  --  13*  ALKPHOS 205*  --  234*  BILITOT 1.1  --  1.2  PROT 7.0  --  7.0  ALBUMIN 2.5* 2.7* 2.4*    Recent Labs Lab 03/31/17 0258 04/01/17 0241 04/02/17 0709 04/04/17 0417 04/05/17 0601  WBC 15.9* 16.0* 13.7* 12.5* 16.9*  HGB 8.3* 8.6* 8.7* 8.5* 8.4*  HCT 28.8* 30.8* 31.2*  29.8* 29.5*  MCV 96.6 97.8 100.3* 100.3* 99.3  PLT 402* 401* 445* 489* 459*     Recent Labs Lab 04/04/17 1946 04/04/17 2328 04/05/17 0425 04/05/17 0546 04/05/17 0747  GLUCAP 193* 132* 131* 131* 93    Medications: Infusions: . methocarbamol (ROBAXIN)  IV    . piperacillin-tazobactam (ZOSYN)  IV 3.375 g (04/05/17 0927)    Scheduled Medications: . calcium carbonate (dosed in mg elemental calcium)  250 mg of elemental calcium Oral TID WC  . chlorhexidine gluconate (MEDLINE KIT)  15 mL Mouth Rinse BID  . darbepoetin (ARANESP) injection - DIALYSIS  200 mcg Intravenous Q Thu-HD  . dextrose  1 ampule Intravenous Once  . docusate sodium  100 mg Oral BID  . feeding supplement (NEPRO CARB STEADY)  237 mL Oral BID BM  . feeding supplement (PRO-STAT SUGAR FREE 64)  30 mL Oral BID  .  [name]'s butt cream   Topical QID  . heparin  5,000 Units Subcutaneous Q8H  . insulin aspart  0-15 Units Subcutaneous Q4H  . insulin glargine  8 Units Subcutaneous Daily  . mouth rinse  15 mL Mouth Rinse QID  . midodrine  10 mg Oral Q T,Th,Sa-HD  . multivitamin  1 tablet Oral QHS  . pantoprazole sodium  40 mg Per Tube Q24H  . saccharomyces boulardii  250 mg Oral BID   Dialysis:  [facility] TTS 4h 61mn    F200    114kg    2/2 bath   LUA AVF   Hep none Iron Sucrose (Venofer) 50 mg IVP During Dialysis 1X Week  Mircera 75 mcg IV q  2 weeks ( last on 02/05/17) Vitamin D (Calcitriol) Oral 0.75 mcg  Po q hd    Summary: 36year old female w/ ESRD d/t poorly controlled DM, HD bilateral TMA's, obesity, HTN, medical non-compliance, NICM. Admitted 5/19 w/left leg pain, + radiological evidence of osteomyelitis w/wound nonhealing, dehiscence, purulent drainage. Admitted for IV antibiotics, hemodialysis and possible surgery. Found unresponsive in her room 5/19. Rec'd  CPR, epi bicarb; intubated; cooled. Now with anoxic brain injury, trach and s/p L AKA this hosp. Course further complicated by recurrent aspiration  PNA, dehiscence of AK wound requiring revision 7/6.   Problems/Plans  1. Anoxic brain injury- has made some progress. Awake, follow some commands, interacts, eats some, but issues with recurrent aspiration. Still with hands mittened to avoid pulling things out. Very restless in HD requiring 1 on 1 care often during TMT.  2. Trach - on trach collar. Has pulled out twice herself in past week. Not sure what the plan is for decannulation. Not on vent since 6/20.  3. Fever/ PNA /^WBC - recurrent asp'n 6/28. Zosyn since 6/30. R sided infiltrate improving.  Anticipate d/c ATB's 7/10 per TRH note 4. Resting tachycardia - Euvolemic on exam. Intolerant of standing doses of BB - getting low dose of metoprolol BID prn for HR >120 5. ESRD -  TTS HD.  Hampered by hypotension. Added midodrine which has mitigated hypotension to some degree. Weights all over the place so unclear what to use as EDW. Euvolemic at this time.  6. Anemia- on max aranesp- hgb stable in 8's  7. MBD of CKD - Ca and Phos in range, on calc suspension. PTH 88 on 03/23/17. No calcitriol needed at this time. 8. PVD- s/p L AKA 03/13/17.  Revision 7/6 with replacement of VAC. R heel ulcer new this adm - dry/cbd 9. DM2 - per primary 10. sHF - EF 25-30% contributes to BP issues on HD 11. Nutrition- unclear if will be able to take enough in to meet needs 12. Dispo- difficult with ESRD and trach- [place] Medicaid so not sure LTAC is option.  Not appropriate at this time for HD in the outpt setting (requires much 1 on 1 care to keep from dislodging needles, unable to dialyze in chair, and still with trach).   CJamal Maes MD [facility] [phone] 04/05/2017, 9:56 AM

## 2017-04-05 NOTE — Progress Notes (Signed)
[facility] PROGRESS NOTE  [name] BPZ:[id number] DOB: [birth year] DOA: 02/14/2017  PCP: Vicenta Aly, FNP  Brief History/Interval Summary: Patient is a 36 y.o. female with history of ESRD on HD, poorly controlled diabetes, history of bilateral transmetatarsal foot amputation with subsequent left foot wound dehiscence (refused BKA as outpatient), chronic systolic heart failure felt to be secondary to nonischemic cardiomyopathy admitted on 02/14/17 with wound dehiscence and persistent drainage from the left trans-metatarsal amputation site. She was [facility] to the [facility]. She was subsequently found to be unresponsive in her room on 5/19 after getting IV Dilaudid-she was  in asystole, CPR was started, with ROSC in around 9 minutes.She was then transferred to the intensive care unit, and subsequently underwent cooling and then rewarming. She was unable to be liberated off the ventilator for several weeks , and as a result underwent tracheostomy on 5/31. Patient has been evaluated by neurology during this hospital stay, per neurology chances of meaningful neurological recovery was dismal. Hospital course has now been complicated by persistent leukocytosis, respiratory failure, and wound dehiscence with gangrene causing persistent SIRS pathophysiology and now suspected aspiration PNA. 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 Since then the patient has been weaned off the vent and maintained on trach collar. Low blood pressures have been an issue , especially with respect to her need for dialysis. Palliative medicine is following.   Consultants:   Infectious disease:  Neurology  Gastroenterology  Wound care  PCCM  Nephrology  Orthopedics  Procedures:   Transthoracic echocardiogram 5/20 Study Conclusions  - Left ventricle: The cavity size was normal. Wall thickness was   increased in a pattern of mild LVH. Systolic function was   moderately to severely reduced. The  estimated ejection fraction   was in the range of 30% to 35%. Diffuse hypokinesis. There is   akinesis of the basal-midinferior myocardium. Features are   consistent with a pseudonormal left ventricular filling pattern,   with concomitant abnormal relaxation and increased filling   pressure (grade 2 diastolic dysfunction). - Mitral valve: There was moderate regurgitation. Valve area by   pressure half-time: 2.44 cm^2. - Left atrium: The atrium was mildly dilated. - Right ventricle: The cavity size was mildly dilated. Wall   thickness was normal. - Tricuspid valve: There was mild regurgitation. - Pulmonary arteries: Systolic pressure was mildly increased. PA   peak pressure: 43 mm Hg (S). - Pericardium, extracardiac: A moderate pericardial effusion was   identified circumferential to the heart. . Largest pocket of   fluid is posterior to right atrium (2cm). No evidence of   tamponade physiology.  Transthoracic echocardiogram 6/28 Study Conclusions  - Left ventricle: The cavity size was normal. There was mild   concentric hypertrophy. Systolic function was severely reduced.   The estimated ejection fraction was in the range of 25% to 30%.   Diffuse hypokinesis. Regional wall motion abnormalities cannot be   excluded. - Mitral valve: There was mild regurgitation. - Pericardium, extracardiac: A trivial pericardial effusion was   identified circumferential to the heart.  Left above-knee amputation. 6/15  Antibiotics: She has previously completed multiple courses of antibiotics. Was started back on ceftazidime 6/29. Changed over to Zosyn to cover anaerobic organisms on 6/30.  Subjective/Interval History:  The patient still looks comfortable , no change in her situation , no fevers but tachycardic all the time ,discussed the case with the RN and no change in the status .  Objective:  Vital Signs  Vitals:  04/05/17 0426 04/05/17 0735 04/05/17 0749 04/05/17 0959  BP:  (!)  98/57 106/73 114/74  Pulse:  (!) 121 (!) 118 (!) 126  Resp:  16 18   Temp: 98.1 F (36.7 C)  98.7 F (37.1 C)   TempSrc: Oral  Oral   SpO2:  97% 100%   Weight: 79 kg (174 lb 2.6 oz)     Height:        Intake/Output Summary (Last 24 hours) at 04/05/17 1046 Last data filed at 04/05/17 0927  Gross per 24 hour  Intake              553 ml  Output             1525 ml  Net             -972 ml   Filed Weights   04/04/17 0915 04/04/17 1317 04/05/17 0426  Weight: 83 kg (182 lb 15.7 oz) 80 kg (176 lb 5.9 oz) 79 kg (174 lb 2.6 oz)     General appearance: NAD. Resp: Clear to auscultation bilaterally. Cardio: S1, S2 is normal, regular. No S3, S4. No rubs, murmurs or bruit GI: Abdomen is soft, nontender, nondistended. Bowel sounds are present. No masses or organomegaly Extremities: Left AKA. Wound dehiscence has been revised. Neurologic: Awake, alert. Aphasic, MOVES her extremities. Lab Results:  Data Reviewed: I have personally reviewed following labs and imaging studies  CBC:  Recent Labs Lab 03/31/17 0258 04/01/17 0241 04/02/17 0709 04/04/17 0417 04/05/17 0601  WBC 15.9* 16.0* 13.7* 12.5* 16.9*  HGB 8.3* 8.6* 8.7* 8.5* 8.4*  HCT 28.8* 30.8* 31.2* 29.8* 29.5*  MCV 96.6 97.8 100.3* 100.3* 99.3  PLT 402* 401* 445* 489* 459*    Basic Metabolic Panel:  Recent Labs Lab 03/31/17 0258 04/01/17 0241 04/02/17 0709 04/04/17 0417 04/05/17 0601  NA 139 138 141 137 136  K 4.5 3.4* 4.0 4.1 3.8  CL 96* 96* 98* 97* 96*  CO2 _0 GLUCOSE 92 94 119* 95 132*  BUN 78* 19 41* 22* 14  CREATININE 7.05* 3.10* 4.94* 3.86* 2.83*  CALCIUM 9.1 8.9 8.9 8.8* 8.7*  PHOS  --   --   --  5.5*  --     GFR: Estimated Creatinine Clearance: 30.4 mL/min (A) (by C-G formula based on SCr of 2.83 mg/dL (H)).  Liver Function Tests:  Recent Labs Lab 04/02/17 0709 04/04/17 0417 04/05/17 0601  AST 18  --  19  ALT 12*  --  13*  ALKPHOS 205*  --  234*  BILITOT 1.1  --  1.2  PROT  7.0  --  7.0  ALBUMIN 2.5* 2.7* 2.4*     CBG:  Recent Labs Lab 04/04/17 1946 04/04/17 2328 04/05/17 0425 04/05/17 0546 04/05/17 0747  GLUCAP 193* 132* 131* 131* 93     Recent Results (from the past 240 hour(s))  Culture, blood (routine x 2)     Status: None   Collection Time: 03/26/17  2:31 PM  Result Value Ref Range Status   Specimen Description BLOOD LEFT ARM  Final   Special Requests IN PEDIATRIC BOTTLE Blood Culture adequate volume  Final   Culture NO GROWTH 5 DAYS  Final   Report Status 03/31/2017 FINAL  Final  Culture, blood (routine x 2)     Status: None   Collection Time: 03/26/17  2:31 PM  Result Value Ref Range Status   Specimen Description BLOOD RIGHT HAND  Final  Special Requests IN PEDIATRIC BOTTLE Blood Culture adequate volume  Final   Culture NO GROWTH 5 DAYS  Final   Report Status 03/31/2017 FINAL  Final  MRSA PCR Screening     Status: None   Collection Time: 03/27/17  8:09 AM  Result Value Ref Range Status   MRSA by PCR NEGATIVE NEGATIVE Final    Comment:        The GeneXpert MRSA Assay (FDA approved for NASAL specimens only), is one component of a comprehensive MRSA colonization surveillance program. It is not intended to diagnose MRSA infection nor to guide or monitor treatment for MRSA infections.       Radiology Studies: No results found.   Medications:  Scheduled: . calcium carbonate (dosed in mg elemental calcium)  250 mg of elemental calcium Oral TID WC  . chlorhexidine gluconate (MEDLINE KIT)  15 mL Mouth Rinse BID  . darbepoetin (ARANESP) injection - DIALYSIS  200 mcg Intravenous Q Thu-HD  . dextrose  1 ampule Intravenous Once  . docusate sodium  100 mg Oral BID  . feeding supplement (NEPRO CARB STEADY)  237 mL Oral BID BM  . feeding supplement (PRO-STAT SUGAR FREE 64)  30 mL Oral BID  . [name]'s butt cream   Topical QID  . heparin  5,000 Units Subcutaneous Q8H  . insulin aspart  0-15 Units Subcutaneous Q4H  . insulin  glargine  8 Units Subcutaneous Daily  . mouth rinse  15 mL Mouth Rinse QID  . midodrine  10 mg Oral Q T,Th,Sa-HD  . multivitamin  1 tablet Oral QHS  . pantoprazole sodium  40 mg Per Tube Q24H  . saccharomyces boulardii  250 mg Oral BID   Continuous: . methocarbamol (ROBAXIN)  IV    . piperacillin-tazobactam (ZOSYN)  IV 3.375 g (04/05/17 0927)   MGN:[id number] **OR** acetaminophen, albuterol, bisacodyl, fentaNYL (SUBLIMAZE) injection, HYDROmorphone (DILAUDID) injection, loperamide, magnesium citrate, methocarbamol **OR** methocarbamol (ROBAXIN)  IV, metoCLOPramide **OR** metoCLOPramide (REGLAN) injection, metoprolol tartrate, ondansetron **OR** ondansetron (ZOFRAN) IV, polyethylene glycol, RESOURCE THICKENUP CLEAR  Assessment/Plan:  Principal Problem:   Acute on chronic respiratory failure with hypoxemia (HCC) Active Problems:   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)   Normocytic anemia   Hyperlipidemia   Hypertension   Non-ischemic cardiomyopathy - by echo 8/16- EF 35-40%   Morbid obesity-    Acute combined systolic and diastolic heart failure (HCC)   ESRD (end stage renal disease) ([place])   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)   Diabetic osteomyelitis (HCC)   Dehiscence of amputation stump (HCC)   Osteomyelitis (HCC)   Chronic pain syndrome   Cardiac arrest, cause unspecified (HCC)   Pressure injury of skin   Wound dehiscence   Coma ([place])   Elevated liver enzymes   Jaundice   Encounter for central line placement   Protein calorie malnutrition ([place])   DNR (do not resuscitate) discussion   Palliative care by specialist   Tachycardia   Goals of care, counseling/discussion   Anoxic brain injury [facility])   Palliative care encounter   Tracheostomy status ([place])   Abscess of left leg   Hypoxemia   Fever   Aspiration into airway    Acute /chronic hypoxemic respiratory failure in a setting of cardiac arrest :s/p intubation then trach .  Last  ventilator use was on night of 6/19-6/20. On TC for several days. PCCM following for resp issues -tracheostomy in place. Change to #4 cuffless trach. S/P MBS 6/23. Now status post  FEES 6/27. Patient started on nectar thick liquids and dysphagia 1 diet 6/27. Holding off on PEG for now. Cortrak removed 6/28. Unfortunately patient has aspirated again. She was febrile and tachypneic. Repeat chest x-ray on 6/29 showed new right-sided infiltrate. She was started on [place] on 6/29 and changed over to Zosyn to provide anaerobic coverage. Seems to be stable. Chest x-ray from 7/1 showed improving opacity in the right lung, will stop the Abx on 07/10 if ok with ID. CXR to be repeated today .    Patient developed recurrent aspiration on 6/28-29. She was started back on antibiotics. She is improving. Heart rate is improved. Continue Zosyn for now. Initial infections were due to left foot gangrene and abscess. Now, she status post AKA. Blood cultures have been done multiple times and have been negative so far. She has had C. difficile testing, which was also been negative. CT scan did not show any PE. She's completed multiple courses of antibiotics. Most recently started on [place] on 6/29 and changed to Zosyn on 6/30.    ESRD On HD per Nephrology since June 17 , Tolerating it well, continue to follow up with Nephrology .  Left trans-metatarsal stump wound dehiscence with infection and gangrene Per operative note-pus/abscess noted extending up to the knee when BKA was attempted, subsequently underwent a AKA. Note, initially family was very reluctant to proceed with amputation, but given persistent leukocytosis/fever and potential risk of sepsis, family agreed for amputation, subsequently AKA was done on 6/15. Orthopedics removed wound VAC 6/20. Seen by orthopedics.revision of her left AKA happened 04/02/17 , 04/02/17 Wound care nurse following for the pressure wound on the right heel.  Anoxic brain injury secondary  to Cardiac arrest on 5/19 Patient has shown some cognitive improvement over the last many days. She has been noted to follow certain commands at times. Her prognosis remains guarded. She has been seen by neurology during this hospitalization, she continues to show sign of improvement .  Diarrhea, continues to have loose stools Continue probiotics and Imodium as needed. Try to discontinue Flexiseal.  Normocytic anemia Likely secondary to chronic disease-probably worsened by acute illness. No signs of bleeding, hemoglobin has been stable with some fluctuations.  Diabetes mellitus type 2  Continue with SSI. On Lantus. HbA1c 5.9.  Chronic systolic heart failure/nonischemic cardiomyopathy Volume was being managed with hemodialysis. Patient was placed on beta blocker, but she has not been able to due to low blood pressure. Coreg has been discontinued. Echocardiogram reports as above. EF 25-30% per last echocardiogram, remains compensated .  Pericardial effusion Seen on TEE-with no evidence of tamponade pathophysiology. Repeat echo 6/26 with no significant pericardial effusion.  Moderate protein calorie malnutrition Started orals 6/27. Aspiration precautions.   Goals of care Prognosis is guarded.  DVT Prophylaxis: Subcutaneous heparin    Code Status: Full code  Family Communication: Discussed with the mother  Disposition Plan: Management as outlined above.    Jereline Ticer,MD .  [facility] Pager [phone] 04/05/2017, 10:46 AM  If 7PM-7AM, please contact night-coverage at [email or web address], password [facility]

## 2017-04-05 NOTE — Progress Notes (Signed)
Patient ID: [name], female   DOB: 1981/09/26, 36 y.o.   MRN: [id number] Patient is status post revision left above the knee amputation. Patient ate a good breakfast this morning. There is no additional drainage in the wound VAC canister. We will leave the wound VAC in place for 1 week.

## 2017-04-06 LAB — RENAL FUNCTION PANEL
ANION GAP: 16 — AB (ref 5–15)
Albumin: 2.3 g/dL — ABNORMAL LOW (ref 3.5–5.0)
BUN: 32 mg/dL — ABNORMAL HIGH (ref 6–20)
CHLORIDE: 95 mmol/L — AB (ref 101–111)
CO2: 26 mmol/L (ref 22–32)
Calcium: 8.7 mg/dL — ABNORMAL LOW (ref 8.9–10.3)
Creatinine, Ser: 4.48 mg/dL — ABNORMAL HIGH (ref 0.44–1.00)
GFR calc non Af Amer: 12 mL/min — ABNORMAL LOW (ref >60)
GFR, EST AFRICAN AMERICAN: 13 mL/min — AB (ref >60)
Glucose, Bld: 109 mg/dL — ABNORMAL HIGH (ref 65–99)
POTASSIUM: 4.7 mmol/L (ref 3.5–5.1)
Phosphorus: 5.6 mg/dL — ABNORMAL HIGH (ref 2.5–4.6)
Sodium: 137 mmol/L (ref 135–145)

## 2017-04-06 LAB — GLUCOSE, CAPILLARY
GLUCOSE-CAPILLARY: 97 mg/dL (ref 65–99)
GLUCOSE-CAPILLARY: 75 mg/dL (ref 65–99)
GLUCOSE-CAPILLARY: 90 mg/dL (ref 65–99)
GLUCOSE-CAPILLARY: 207 mg/dL — AB (ref 65–99)
Glucose-Capillary: 121 mg/dL — ABNORMAL HIGH (ref 65–99)
Glucose-Capillary: 129 mg/dL — ABNORMAL HIGH (ref 65–99)
Glucose-Capillary: 132 mg/dL — ABNORMAL HIGH (ref 65–99)
Glucose-Capillary: 245 mg/dL — ABNORMAL HIGH (ref 65–99)
Glucose-Capillary: 247 mg/dL — ABNORMAL HIGH (ref 65–99)

## 2017-04-06 LAB — CBC
HEMATOCRIT: 27.6 % — AB (ref 36.0–46.0)
HEMOGLOBIN: 7.8 g/dL — AB (ref 12.0–15.0)
MCH: 28.1 pg (ref 26.0–34.0)
MCHC: 28.3 g/dL — ABNORMAL LOW (ref 30.0–36.0)
MCV: 99.3 fL (ref 78.0–100.0)
Platelets: 457 10*3/uL — ABNORMAL HIGH (ref 150–400)
RBC: 2.78 MIL/uL — AB (ref 3.87–5.11)
RDW: 17.8 % — ABNORMAL HIGH (ref 11.5–15.5)
WBC: 19.7 10*3/uL — AB (ref 4.0–10.5)

## 2017-04-06 MED ORDER — ALBUMIN HUMAN 25 % IV SOLN: 25.0000 g | Freq: Once | INTRAVENOUS | Status: DC

## 2017-04-06 MED ORDER — MIDODRINE HCL 5 MG PO TABS
10.0000 mg | ORAL_TABLET | ORAL | Status: AC
  Administered 2017-04-06: 10 mg via ORAL
  Filled 2017-04-06: qty 2

## 2017-04-06 NOTE — Progress Notes (Signed)
Physical Therapy Treatment Patient Details Name: [name] MRN: [id number] DOB: [birth year] Today's Date: 04/06/2017    History of Present Illness [name] is a 36 y.o. 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 ETT 5/19 and trach'd 5/31.     PT Comments    Patient tolerated sitting EOB ~20 minutes working on sitting tolerance, balance and feeding. Pt continues to be lethargic and thrashing UEs at times. Mostly nonverbal despite wearing PMV.  Pt requires Min-Max A for EOB balance with LOB anteriorly/posteriorly without warning. Will continue to follow and progress as tolerated.  Follow Up Recommendations  SNF;Supervision/Assistance - 24 hour     Equipment Recommendations  Other (comment) (defer to next venue)    Recommendations for Other Services       Precautions / Restrictions Precautions Precautions: Fall Precaution Comments: Trach collar; PMV with supervision; bil mitt restraints Restrictions Other Position/Activity Restrictions: PRAFO boot on RLE    Mobility  Bed Mobility Overal bed mobility: Needs Assistance Bed Mobility: Supine to Sit     Supine to sit: Max assist;+2 for physical assistance;HOB elevated Sit to supine: Max assist;+2 for physical assistance   General bed mobility comments: Assist with LEs and to elevate trunk to get to EOB. Assist to bring LEs into bed and lower trunk.  Transfers                 General transfer comment: deferred  Ambulation/Gait                 Stairs            Wheelchair Mobility    Modified Rankin (Stroke Patients Only)       Balance Overall balance assessment: Needs assistance Sitting-balance support: Bilateral upper extremity supported  (foot supported) Sitting balance-Leahy Scale: Zero Sitting balance - Comments: Varying assist needed for EOB balance ranging from Max A-Min A with unpredictable movements forward and posteriory. Sat EOB ~20 mins to assist with feeding/breakfast and balance. Postural control: Posterior lean;Right lateral lean                                  Cognition Arousal/Alertness: Lethargic Behavior During Therapy: Restless;Flat affect Overall Cognitive Status: Difficult to assess                     Current Attention Level: Sustained   Following Commands: Follows one step commands inconsistently;Follows one step commands with increased time   Awareness: Intellectual Problem Solving: Slow processing;Decreased initiation;Difficulty sequencing;Requires tactile cues;Requires verbal cues General Comments: Attempting to bite arm/hand when near mouth. Thrashing UEs. Mostly nonverbal despite PMV.      Exercises      General Comments General comments (skin integrity, edema, etc.): Mother present. BP soft but stable.      Pertinent Vitals/Pain Pain Assessment: Faces Faces Pain Scale: Hurts little more Pain Location: not able to state but with movement Pain Descriptors / Indicators: Grimacing Pain Intervention(s): Monitored during session;Repositioned;Limited activity within patient's tolerance    Home Living  Prior Function            PT Goals (current goals can now be found in the care plan section) Progress towards PT goals: Not progressing toward goals - comment (possibly due to lethargy vs cognition?)    Frequency    Min 2X/week      PT Plan Current plan remains appropriate;Frequency needs to be updated    Co-evaluation PT/OT/SLP Co-Evaluation/Treatment: Yes Reason for Co-Treatment: Complexity of the patient's impairments (multi-system involvement);Necessary to address cognition/behavior during functional activity;For  patient/therapist safety PT goals addressed during session: Mobility/safety with mobility;Balance        AM-PAC PT "6 Clicks" Daily Activity  Outcome Measure  Difficulty turning over in bed (including adjusting bedclothes, sheets and blankets)?: Total Difficulty moving from lying on back to sitting on the side of the bed? : Total Difficulty sitting down on and standing up from a chair with arms (e.g., wheelchair, bedside commode, etc,.)?: Total Help needed moving to and from a bed to chair (including a wheelchair)?: Total Help needed walking in hospital room?: Total Help needed climbing 3-5 steps with a railing? : Total 6 Click Score: 6    End of Session Equipment Utilized During Treatment: Oxygen (5L/min 02 on trach collar 28%) Activity Tolerance: Patient limited by fatigue;Patient limited by lethargy Patient left: in bed;with call bell/phone within reach;with family/visitor present;Other (comment);with restraints reapplied Primary school teacher) Nurse Communication: Mobility status;Need for lift equipment PT Visit Diagnosis: Muscle weakness (generalized) (M62.81);Other abnormalities of gait and mobility (R26.89)     Time: 1610-9604 PT Time Calculation (min) (ACUTE ONLY): 35 min  Charges:  $Therapeutic Activity: 8-22 mins                    G Codes:       Mylo Red, PT, DPT [phone]     [name] 04/06/2017, 10:14 AM

## 2017-04-06 NOTE — Progress Notes (Signed)
Hospitalist notified of a BP drop to 69/55 one hour post dilauded   administration ( 1 mg intravenous ).

## 2017-04-06 NOTE — Progress Notes (Signed)
[facility] PROGRESS NOTE  [name] WUJ:[id number] DOB: [birth year] DOA: 02/14/2017  PCP: Vicenta Aly, FNP  Brief History/Interval Summary: Patient is a 36 y.o. female with history of ESRD on HD, poorly controlled diabetes, history of bilateral transmetatarsal foot amputation with subsequent left foot wound dehiscence (refused BKA as outpatient), chronic systolic heart failure felt to be secondary to nonischemic cardiomyopathy admitted on 02/14/17 with wound dehiscence and persistent drainage from the left trans-metatarsal amputation site. She was admitted to the [facility]. She was subsequently found to be unresponsive in her room on 5/19 after getting IV Dilaudid-she was  in asystole, CPR was started, with ROSC in around 9 minutes.She was then transferred to the intensive care unit, and subsequently underwent cooling and then rewarming. She was unable to be liberated off the ventilator for several weeks , and as a result underwent tracheostomy on 5/31. Patient has been evaluated by neurology during this hospital stay, per neurology chances of meaningful neurological recovery was dismal. Hospital course has now been complicated by persistent leukocytosis, respiratory failure, and wound dehiscence with gangrene causing persistent SIRS pathophysiology and now suspected aspiration PNA. 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 Since then the patient has been weaned off the vent and maintained on trach collar. Low blood pressures have been an issue , especially with respect to her need for dialysis. Palliative medicine is following.   Consultants:   Infectious disease:  Neurology  Gastroenterology  Wound care  PCCM  Nephrology  Orthopedics  Procedures:   Transthoracic echocardiogram 5/20 Study Conclusions  - Left ventricle: The cavity size was normal. Wall thickness was   increased in a pattern of mild LVH. Systolic function was   moderately to severely reduced. The  estimated ejection fraction   was in the range of 30% to 35%. Diffuse hypokinesis. There is   akinesis of the basal-midinferior myocardium. Features are   consistent with a pseudonormal left ventricular filling pattern,   with concomitant abnormal relaxation and increased filling   pressure (grade 2 diastolic dysfunction). - Mitral valve: There was moderate regurgitation. Valve area by   pressure half-time: 2.44 cm^2. - Left atrium: The atrium was mildly dilated. - Right ventricle: The cavity size was mildly dilated. Wall   thickness was normal. - Tricuspid valve: There was mild regurgitation. - Pulmonary arteries: Systolic pressure was mildly increased. PA   peak pressure: 43 mm Hg (S). - Pericardium, extracardiac: A moderate pericardial effusion was   identified circumferential to the heart. . Largest pocket of   fluid is posterior to right atrium (2cm). No evidence of   tamponade physiology.  Transthoracic echocardiogram 6/28 Study Conclusions  - Left ventricle: The cavity size was normal. There was mild   concentric hypertrophy. Systolic function was severely reduced.   The estimated ejection fraction was in the range of 25% to 30%.   Diffuse hypokinesis. Regional wall motion abnormalities cannot be   excluded. - Mitral valve: There was mild regurgitation. - Pericardium, extracardiac: A trivial pericardial effusion was   identified circumferential to the heart.  Left above-knee amputation. 6/15  Antibiotics: She has previously completed multiple courses of antibiotics. Was started back on ceftazidime 6/29. Changed over to Zosyn to cover anaerobic organisms on 6/30.  Subjective/Interval History:  The patient looks in the same clinical situation, moving upper and lower extremities full dose commands but her eyes, she is still tachycardic afebrile without any issues or complaints overnight.  Objective:  Vital Signs  Vitals:   04/06/17 0351  04/06/17 0420 04/06/17 0801  04/06/17 0811  BP: (!) 135/96  110/65   Pulse: (!) 113  (!) 107 (!) 10  Resp: (!) 22  15   Temp: 99 F (37.2 C)  98.6 F (37 C)   TempSrc: Oral  Oral   SpO2: 97% 95% 94%   Weight:      Height:       No intake or output data in the 24 hours ending 04/06/17 1119 Filed Weights   04/04/17 0915 04/04/17 1317 04/05/17 0426  Weight: 83 kg (182 lb 15.7 oz) 80 kg (176 lb 5.9 oz) 79 kg (174 lb 2.6 oz)     General appearance: non distressed . Resp: CTAB. Cardio: S1, S2 is normal, regular. No S3, S4. No rubs, murmurs or bruit GI: Abdomen is soft, nontender, nondistended. Bowel sounds are present. No masses or organomegaly Extremities: Left AKA. Wound dehiscence has been revised. Neurologic: Awake, alert. Aphasic, MOVES her extremities. Lab Results:  Data Reviewed: I have personally reviewed following labs and imaging studies  CBC:  Recent Labs Lab 04/01/17 0241 04/02/17 0709 04/04/17 0417 04/05/17 0601 04/06/17 0443  WBC 16.0* 13.7* 12.5* 16.9* 19.7*  HGB 8.6* 8.7* 8.5* 8.4* 7.8*  HCT 30.8* 31.2* 29.8* 29.5* 27.6*  MCV 97.8 100.3* 100.3* 99.3 99.3  PLT 401* 445* 489* 459* 457*    Basic Metabolic Panel:  Recent Labs Lab 04/01/17 0241 04/02/17 0709 04/04/17 0417 04/05/17 0601 04/06/17 0443  NA 138 141 137 136 137  K 3.4* 4.0 4.1 3.8 4.7  CL 96* 98* 97* 96* 95*  CO2 _0 GLUCOSE 94 119* 95 132* 109*  BUN 19 41* 22* 14 32*  CREATININE 3.10* 4.94* 3.86* 2.83* 4.48*  CALCIUM 8.9 8.9 8.8* 8.7* 8.7*  PHOS  --   --  5.5*  --  5.6*    GFR: Estimated Creatinine Clearance: 19.2 mL/min (A) (by C-G formula based on SCr of 4.48 mg/dL (H)).  Liver Function Tests:  Recent Labs Lab 04/02/17 0709 04/04/17 0417 04/05/17 0601 04/06/17 0443  AST 18  --  19  --   ALT 12*  --  13*  --   ALKPHOS 205*  --  234*  --   BILITOT 1.1  --  1.2  --   PROT 7.0  --  7.0  --   ALBUMIN 2.5* 2.7* 2.4* 2.3*     CBG:  Recent Labs Lab 04/05/17 1638 04/05/17 1955  04/06/17 0106 04/06/17 0404 04/06/17 0758  GLUCAP 102* 259* 75 129* 90     No results found for this or any previous visit (from the past 240 hour(s)).    Radiology Studies: Dg Chest Port 1 View  Result Date: 04/05/2017 CLINICAL DATA:  SOB. Hx diabetes, HTN, PNA, CAD, COPD. EXAM: PORTABLE CHEST 1 VIEW COMPARISON:  03/29/2017 FINDINGS: Tracheostomy is unchanged in position. Numerous leads and wires project over the chest. Midline trachea. Cardiomegaly accentuated by AP portable technique. No pleural fluid. No pneumothorax. Moderate interstitial prominence and indistinctness. Improved right-sided aeration with patchy left mid and lower lung airspace disease developing. IMPRESSION: Cardiomegaly with similar moderate interstitial prominence, suspicious for pulmonary edema. Shifting airspace opacities which could represent infection or alveolar edema. Electronically Signed   By: Abigail Miyamoto M.D.   On: 04/05/2017 11:24     Medications:  Scheduled: . calcium carbonate (dosed in mg elemental calcium)  250 mg of elemental calcium Oral TID WC  . chlorhexidine gluconate (MEDLINE KIT)  15 mL  Mouth Rinse BID  . darbepoetin (ARANESP) injection - DIALYSIS  200 mcg Intravenous Q Thu-HD  . dextrose  1 ampule Intravenous Once  . docusate sodium  100 mg Oral BID  . feeding supplement (NEPRO CARB STEADY)  237 mL Oral BID BM  . feeding supplement (PRO-STAT SUGAR FREE 64)  30 mL Oral BID  . [name]'s butt cream   Topical QID  . heparin  5,000 Units Subcutaneous Q8H  . insulin aspart  0-15 Units Subcutaneous Q4H  . insulin glargine  8 Units Subcutaneous Daily  . mouth rinse  15 mL Mouth Rinse QID  . midodrine  10 mg Oral Q T,Th,Sa-HD  . multivitamin  1 tablet Oral QHS  . pantoprazole sodium  40 mg Per Tube Q24H  . saccharomyces boulardii  250 mg Oral BID   Continuous: . albumin human    . methocarbamol (ROBAXIN)  IV    . 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)   Normocytic anemia   Hyperlipidemia   Hypertension   Non-ischemic cardiomyopathy - by echo 8/16- EF 35-40%   Morbid obesity-    Acute combined systolic and diastolic heart failure (HCC)   ESRD (end stage renal disease) ([place])   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)   Diabetic osteomyelitis (HCC)   Dehiscence of amputation stump (HCC)   Osteomyelitis (HCC)   Chronic pain syndrome   Cardiac arrest, cause unspecified (HCC)   Pressure injury of skin   Wound dehiscence   Coma ([place])   Elevated liver enzymes   Jaundice   Encounter for central line placement   Protein calorie malnutrition ([place])   DNR (do not resuscitate) discussion   Palliative care by specialist   Tachycardia   Goals of care, counseling/discussion   Anoxic brain injury [facility])   Palliative care encounter   Tracheostomy status ([place])   Abscess of left leg   Hypoxemia   Fever   Aspiration into airway    Acute /chronic hypoxemic respiratory failure in a setting of cardiac arrest :s/p intubation then trach .  Last ventilator use was on night of 6/19-6/20. On TC for several days. PCCM following for resp issues -tracheostomy in place. Change to #4 cuffless trach. S/P MBS 6/23. Now status post FEES 6/27. Patient started on nectar thick liquids and dysphagia 1 diet 6/27. Holding off on PEG for now. Cortrak removed 6/28. Unfortunately patient has aspirated again.  She was febrile and tachypneic. Repeat chest x-ray on 6/29 showed new right-sided infiltrate. She was started on [place] on 6/29 and changed over to Zosyn to provide anaerobic coverage. Seems to be stable. Chest x-ray from 7/1 showed improving opacity in the right lung. CXR  repeated showed shifting airspace opacities which could represent infection or alveolar edema, her [place] is trending up , continue Zosyn we'll give 1 dose of vancomycin followed by the ID input.  . ESRD On HD per Nephrology since June 17 , Tolerating it well, continue to follow up with Nephrology .  Left trans-metatarsal stump wound dehiscence with infection and gangrene Per operative note-pus/abscess noted extending up to the knee  when BKA was attempted, subsequently underwent a AKA. Note, initially family was very reluctant to proceed with amputation, but given persistent leukocytosis/fever and potential risk of sepsis, family agreed for amputation, subsequently AKA was done on 6/15. Orthopedics removed wound VAC 6/20. Seen by orthopedics.revision of her left AKA happened 04/02/17 , 04/02/17 Wound care nurse following for the pressure wound on the right heel.  Initial infections were due to left foot gangrene and abscess. Now, she status post AKA. Blood cultures have been done multiple times and have been negative so far. She has had C. difficile testing, which was also been negative. CT scan did not show any PE. She's completed multiple courses of antibiotics. Most recently started on Fortaz on 6/29 and changed to Zosyn on 6/30.     Anoxic brain injury secondary to Cardiac arrest on 5/19 Patient has shown some cognitive improvement over the last many days. She has been noted to follow certain commands at times. Her prognosis remains guarded. She has been seen by neurology during this hospitalization, she continues to show some sign of improvement .  Diarrhea, continues to have loose stools Continue probiotics and Imodium as  needed. Try to discontinue Flexiseal.  Normocytic anemia Likely secondary to chronic disease-probably worsened by acute illness. No signs of bleeding, hemoglobin has been stable with some fluctuations.  Diabetes mellitus type 2  Continue with SSI. On Lantus. HbA1c 5.9.  Chronic systolic heart failure/nonischemic cardiomyopathy Volume was being managed with hemodialysis. Patient was placed on beta blocker, but she has not been able to due to low blood pressure. Coreg has been discontinued. Echocardiogram reports as above. EF 25-30% per last echocardiogram, remains compensated .  Pericardial effusion Seen on TEE-with no evidence of tamponade pathophysiology. Repeat echo 6/26 with no significant pericardial effusion.  Moderate protein calorie malnutrition Started orals 6/27. Aspiration precautions.   Goals of care Prognosis is guarded.  DVT Prophylaxis: Subcutaneous heparin    Code Status: Full code  Family Communication: Discussed with the mother  Disposition Plan: Management as outlined above.    Quenton Recendez,MD .  [facility] Pager [phone] 04/06/2017, 11:19 AM  If 7PM-7AM, please contact night-coverage at [email or web address], password [facility]

## 2017-04-06 NOTE — Progress Notes (Signed)
  Speech Language Pathology Treatment: Dysphagia;Cognitive-Linquistic  Patient Details Name: [name] MRN: [id number] DOB: 02-07-1981 Today's Date: 04/06/2017 Time: 6578-4696 SLP Time Calculation (min) (ACUTE ONLY): 20 min  Assessment / Plan / Recommendation Clinical Impression  Pt in bed, awake with PMSV in place. Pt responsive to questions with delayed response and repetition needed at times. Continuing to require moderate verbal reminders for deep breaths, increased intensity and deliberant speech. No indications of airway compromise with nectar juice despite large sips (cued for smaller). Doffed valve, washed and placed in container. She has make great progress towards goals since this SLP last worked with her.   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 ETT 5/19 and trach'd 5/31. Hospital course included cardiac arrest, transfer to ICU      SLP Plan  Continue with current plan of care       Recommendations  Diet recommendations: Dysphagia 2 (fine chop);Nectar-thick liquid Liquids provided via: Cup Medication Administration: Crushed with puree Supervision: Patient able to self feed;Full supervision/cueing for compensatory strategies Compensations: Small sips/bites;Slow rate;Minimize environmental distractions Postural Changes and/or Swallow Maneuvers: Seated upright 90 degrees      Patient may use Passy-Muir Speech Valve: During all waking hours (remove during sleep);During PO intake/meals;Caregiver trained to provide supervision;During all therapies with supervision PMSV Supervision: Full         Oral Care Recommendations: Oral care BID Follow up Recommendations: Skilled Nursing  facility SLP Visit Diagnosis: Cognitive communication deficit (R41.841);Dysphagia, unspecified (R13.10) Plan: Continue with current plan of care       GO                [name] 04/06/2017, 2:38 PM  [name] [name].Ed [employer] [phone]

## 2017-04-06 NOTE — Progress Notes (Signed)
Patient remains restless and agitated- pulled out IV access.  Talked to hospitalist , albumin and midodrine ordered

## 2017-04-06 NOTE — Progress Notes (Addendum)
[facility] Rounding Note  Subjective:  No new issues overnight. Mom says she is reading words and speaking w/o capped trach. They are working on Estate agent.  Eating good.   Vital signs in last 24 hours: Vitals:   04/06/17 0351 04/06/17 0420 04/06/17 0801 04/06/17 0811  BP: (!) 135/96  110/65   Pulse: (!) 113  (!) 107 (!) 10  Resp: (!) 22  15   Temp: 99 F (37.2 C)  98.6 F (37 C)   TempSrc: Oral  Oral   SpO2: 97% 95% 94%   Weight:      Height:       Weight change:  No intake or output data in the 24 hours ending 04/06/17 1105 Physical Exam: Lying over to the right side on the bed Trach/trach collar Looks at me, tiny nods in response to questions Anteriorly coarse BS but o/w clear S1S2 No S3 HR 120 Abd obese, soft and non tender Left AKA with wound VAC in place No LE or hip edema Right foot in protective boot Dialysis Access: Left AVF + bruit and thrill  Dialysis: TTS [facility] 4h 31mn   F200  114kg   2/2 bath  LUA AVF  Hep none - Iron Sucrose (Venofer) 50 mg IVP During Dialysis 1X Week  - Mircera 75 mcg IV q  2 weeks ( last on 02/05/17) - Vitamin D (Calcitriol) Oral 0.75 mcg  Po q hd   Summary: 36year old female w/ ESRD d/t poorly controlled DM, HD bilateral TMA's, obesity, HTN, medical non-compliance, NICM. Admitted 5/19 w/left leg pain, + radiological evidence of osteomyelitis w/wound nonhealing, dehiscence, purulent drainage. Admitted for IV antibiotics, hemodialysis and possible surgery. Found unresponsive in her room that afternoon - cardiac arrest > rec'd  CPR, epi bicarb; intubated; cooled. Now with anoxic brain injury, trach and s/p L AKA this hosp. Course further complicated by recurrent aspiration PNA, dehiscence of AK wound requiring revision 7/6.   Assessment: 1. Anoxic brain injury sp arrest - pt is awake, speaking and eating. Still gets agitated / restless on HD. Not to point where she would be stable for OP HD 2. Trach - on trach collar. Has pulled out twice herself in  past week. Not sure what the plan is for decannulation. Not on vent since 6/20.  3. Fever/ PNA /^WBC - recurrent asp'n 6/28. Zosyn since 6/30.  4. Hypotension/ tachycardia - Soft BP, volume status difficult to assess. Intolerant of standing doses of BB for tachycardia.  5. ESRD -  TTS HD, started HD June '17. HD hampered by hypotension. Added midodrine which has mitigated hypotension to some degree 6. Anemia- on max aranesp- hgb stable in 8's  7. MBD of CKD - Ca and Phos in range, on calc suspension. PTH 88 here.   8. HTN - soft BP's and tachy, no BP meds needed- on metoprolol prn only to keep hr under 120 9. 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   10. DM2 - per primary 11. sHF - EF 25-30% contributes to BP issues on HD 12. Nutrition- unclear if will be able to take enough in to meet needs, albumin 3.0 13. Dispo- difficult with ESRD and trach- [place] Medicaid so not sure LTAC is option. Not OP HD candidate at this time (see #1 above)  Plan - HD Tuesday, keep even   RKelly SplinterMD CCopper Ridge Surgery Centerpgr [phone]  04/06/2017, 11:14 AM   Recent Labs Lab 04/04/17 [id number]  0601 04/06/17 0443  NA 137 136 137  K 4.1 3.8 4.7  CL 97* 96* 95*  CO2 26 27 26   GLUCOSE 95 132* 109*  BUN 22* 14 32*  CREATININE 3.86* 2.83* 4.48*  CALCIUM 8.8* 8.7* 8.7*  PHOS 5.5*  --  5.6*    Recent Labs Lab 04/02/17 0709 04/04/17 0417 04/05/17 0601 04/06/17 0443  AST 18  --  19  --   ALT 12*  --  13*  --   ALKPHOS 205*  --  234*  --   BILITOT 1.1  --  1.2  --   PROT 7.0  --  7.0  --   ALBUMIN 2.5* 2.7* 2.4* 2.3*    Recent Labs Lab 04/01/17 0241 04/02/17 0709 04/04/17 0417 04/05/17 0601 04/06/17 0443  WBC 16.0* 13.7* 12.5* 16.9* 19.7*  HGB 8.6* 8.7* 8.5* 8.4* 7.8*  HCT 30.8* 31.2* 29.8* 29.5* 27.6*  MCV 97.8 100.3* 100.3* 99.3 99.3  PLT 401* 445* 489* 459* 457*     Recent Labs Lab 04/05/17 1638 04/05/17 1955 04/06/17 0106 04/06/17 0404 04/06/17 0758   GLUCAP 102* 259* 75 129* 90    Medications: Infusions: . albumin human    . methocarbamol (ROBAXIN)  IV    . piperacillin-tazobactam (ZOSYN)  IV Stopped (04/06/17 0144)    Scheduled Medications: . calcium carbonate (dosed in mg elemental calcium)  250 mg of elemental calcium Oral TID WC  . chlorhexidine gluconate (MEDLINE KIT)  15 mL Mouth Rinse BID  . darbepoetin (ARANESP) injection - DIALYSIS  200 mcg Intravenous Q Thu-HD  . dextrose  1 ampule Intravenous Once  . docusate sodium  100 mg Oral BID  . feeding supplement (NEPRO CARB STEADY)  237 mL Oral BID BM  . feeding supplement (PRO-STAT SUGAR FREE 64)  30 mL Oral BID  . [name]'s butt cream   Topical QID  . heparin  5,000 Units Subcutaneous Q8H  . insulin aspart  0-15 Units Subcutaneous Q4H  . insulin glargine  8 Units Subcutaneous Daily  . mouth rinse  15 mL Mouth Rinse QID  . midodrine  10 mg Oral Q T,Th,Sa-HD  . multivitamin  1 tablet Oral QHS  . pantoprazole sodium  40 mg Per Tube Q24H  . saccharomyces boulardii  250 mg Oral BID

## 2017-04-06 NOTE — Progress Notes (Signed)
Occupational Therapy Treatment Patient Details Name: [name] MRN: [id number] DOB: [birth year] Today's Date: 04/06/2017    History of present illness [name] is a 36 y.o. 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 ETT 5/19 and trach'd 5/31.    OT comments  This 35 yo female admitted and underwent above presents to acute OT today with no awareness of sitting balance, not really following commands. She will continue to benefit from acute OT to work on these two areas to hopefully be able to increase independence and decrease burden of care.  Follow Up Recommendations  SNF;Supervision/Assistance - 24 hour    Equipment Recommendations  Wheelchair (measurements OT);Wheelchair cushion (measurements OT);Hospital bed;3 in 1 bedside commode       Precautions / Restrictions Precautions Precautions: Fall Precaution Comments: Trach collar; PMV with supervision; bil mitt restraints Restrictions Other Position/Activity Restrictions: PRAFO boot on RLE       Mobility Bed Mobility Overal bed mobility: Needs Assistance Bed Mobility: Supine to Sit     Supine to sit: Max assist;+2 for physical assistance;HOB elevated Sit to supine: Max assist;+2 for physical assistance   General bed mobility comments: Assist with LEs and to elevate trunk to get to EOB. Assist to bring LEs into bed and lower trunk.  Transfers                 General transfer comment: deferred    Balance Overall balance assessment: Needs assistance Sitting-balance support: Bilateral upper extremity supported (foot supported) Sitting balance-Leahy Scale: Zero Sitting balance - Comments: Varying assist needed for EOB balance ranging from Max A-Min A with  unpredictable movements forward and posteriory. Sat EOB ~20 mins to assist with feeding/breakfast and balance. Postural control: Posterior lean;Right lateral lean                                 ADL either performed or assessed with clinical judgement   ADL Overall ADL's : Needs assistance/impaired Eating/Feeding: Moderate assistance;Bed level Eating/Feeding Details (indicate cue type and reason): total A sitting EOB due to not able to maintain balance                                         Vision  Mother reports she does wear glasses, but they are not here in hospital            Cognition Arousal/Alertness: Lethargic Behavior During Therapy: Restless;Flat affect Overall Cognitive Status: Difficult to assess                     Current Attention Level: Sustained   Following Commands: Follows one step commands inconsistently;Follows one step commands with increased time   Awareness: Intellectual Problem Solving: Slow processing;Decreased initiation;Difficulty sequencing;Requires tactile cues;Requires verbal cues General Comments: Attempting to bite arm/hand when near mouth. Thrashing UEs. Mostly nonverbal despite PMV.              General Comments Mother present. BP soft but stable.    Pertinent Vitals/ Pain  Pain Assessment: Faces Faces Pain Scale: Hurts little more Pain Location: not able to state but with movement Pain Descriptors / Indicators: Grimacing Pain Intervention(s): Monitored during session;Repositioned;Limited activity within patient's tolerance         Frequency  Min 2X/week        Progress Toward Goals  OT Goals(current goals can now be found in the care plan section)  Progress towards OT goals: Not progressing toward goals - comment (not really following commands today, not attempting to correct sitting balance)     Plan Discharge plan remains appropriate    Co-evaluation    PT/OT/SLP  Co-Evaluation/Treatment: Yes Reason for Co-Treatment: Complexity of the patient's impairments (multi-system involvement);Necessary to address cognition/behavior during functional activity;For patient/therapist safety PT goals addressed during session: Mobility/safety with mobility;Balance OT goals addressed during session: ADL's and self-care;Strengthening/ROM      AM-PAC PT "6 Clicks" Daily Activity     Outcome Measure   Help from another person eating meals?: A Lot Help from another person taking care of personal grooming?: Total Help from another person toileting, which includes using toliet, bedpan, or urinal?: Total Help from another person bathing (including washing, rinsing, drying)?: Total Help from another person to put on and taking off regular upper body clothing?: Total Help from another person to put on and taking off regular lower body clothing?: Total 6 Click Score: 7    End of Session Equipment Utilized During Treatment: Oxygen (5 liters at 27%)  OT Visit Diagnosis: Other abnormalities of gait and mobility (R26.89);Muscle weakness (generalized) (M62.81);Feeding difficulties (R63.3);Other symptoms and signs involving cognitive function;Cognitive communication deficit (R41.841)   Activity Tolerance Patient tolerated treatment well   Patient Left in bed;with call bell/phone within reach;with family/visitor present           Time: 0812-0842 OT Time Calculation (min): 30 min  Charges: OT General Charges $OT Visit: 1 Procedure OT Treatments $Self Care/Home Management : 8-22 mins  Ignacia Palma, OTR/L [phone] 04/06/2017

## 2017-04-06 NOTE — Progress Notes (Signed)
Patient ID: [name], female   DOB [id number]   This NP visited patient at the bedside as a follow up to  yesterday's GOCs meeting.  Placed calll to sister/Tymeka # [phone] and continued dicussion regarding current medical situation and the reality of high risk for decompensation.  Jess Barters is her sister's main support person, she helps the patient's mother with decisions.   Mother has had multiple CVAs in the past and relies on Tymeka for support.      Stressed the  importance of continued conversation with family and their  medical providers regarding overall plan of care and treatment options,  ensuring decisions are within the context of the patients values and GOCs.  Encouraged Tymeka to consider limits of care specifically to code status.   Questions and concerns addressed  Time in 1200          Time out    1235  Total time spent on the unit was 35 minutes  Greater than 50% of the time was spent in counseling and coordination of care PMT will continue to support holistically     [name] NP  Palliative Medicine Team Team Phone # [phone] Pager [phone]

## 2017-04-07 DIAGNOSIS — Z431 Encounter for attention to gastrostomy: Secondary | ICD-10-CM

## 2017-04-07 LAB — COMPREHENSIVE METABOLIC PANEL
ALK PHOS: 194 U/L — AB (ref 38–126)
ALT: 8 U/L — ABNORMAL LOW (ref 14–54)
ANION GAP: 11 (ref 5–15)
AST: 28 U/L (ref 15–41)
Albumin: 2.2 g/dL — ABNORMAL LOW (ref 3.5–5.0)
BUN: 18 mg/dL (ref 6–20)
CALCIUM: 8.6 mg/dL — AB (ref 8.9–10.3)
CO2: 28 mmol/L (ref 22–32)
Chloride: 96 mmol/L — ABNORMAL LOW (ref 101–111)
Creatinine, Ser: 2.6 mg/dL — ABNORMAL HIGH (ref 0.44–1.00)
GFR, EST AFRICAN AMERICAN: 26 mL/min — AB (ref >60)
GFR, EST NON AFRICAN AMERICAN: 22 mL/min — AB (ref >60)
Glucose, Bld: 113 mg/dL — ABNORMAL HIGH (ref 65–99)
Potassium: 4.2 mmol/L (ref 3.5–5.1)
SODIUM: 135 mmol/L (ref 135–145)
Total Bilirubin: 1 mg/dL (ref 0.3–1.2)
Total Protein: 6.5 g/dL (ref 6.5–8.1)

## 2017-04-07 LAB — RENAL FUNCTION PANEL
ALBUMIN: 2.1 g/dL — AB (ref 3.5–5.0)
ANION GAP: 16 — AB (ref 5–15)
BUN: 53 mg/dL — ABNORMAL HIGH (ref 6–20)
CALCIUM: 8.7 mg/dL — AB (ref 8.9–10.3)
CO2: 27 mmol/L (ref 22–32)
CREATININE: 5.63 mg/dL — AB (ref 0.44–1.00)
Chloride: 94 mmol/L — ABNORMAL LOW (ref 101–111)
GFR, EST AFRICAN AMERICAN: 10 mL/min — AB (ref >60)
GFR, EST NON AFRICAN AMERICAN: 9 mL/min — AB (ref >60)
Glucose, Bld: 120 mg/dL — ABNORMAL HIGH (ref 65–99)
PHOSPHORUS: 6 mg/dL — AB (ref 2.5–4.6)
Potassium: 4.8 mmol/L (ref 3.5–5.1)
SODIUM: 137 mmol/L (ref 135–145)

## 2017-04-07 LAB — GLUCOSE, CAPILLARY
GLUCOSE-CAPILLARY: 117 mg/dL — AB (ref 65–99)
Glucose-Capillary: 105 mg/dL — ABNORMAL HIGH (ref 65–99)
Glucose-Capillary: 115 mg/dL — ABNORMAL HIGH (ref 65–99)
Glucose-Capillary: 118 mg/dL — ABNORMAL HIGH (ref 65–99)
Glucose-Capillary: 171 mg/dL — ABNORMAL HIGH (ref 65–99)

## 2017-04-07 LAB — CBC
HCT: 25.1 % — ABNORMAL LOW (ref 36.0–46.0)
HEMOGLOBIN: 7.3 g/dL — AB (ref 12.0–15.0)
MCH: 28.4 pg (ref 26.0–34.0)
MCHC: 29.1 g/dL — ABNORMAL LOW (ref 30.0–36.0)
MCV: 97.7 fL (ref 78.0–100.0)
PLATELETS: 423 10*3/uL — AB (ref 150–400)
RBC: 2.57 MIL/uL — AB (ref 3.87–5.11)
RDW: 17.5 % — ABNORMAL HIGH (ref 11.5–15.5)
WBC: 16.4 10*3/uL — AB (ref 4.0–10.5)

## 2017-04-07 MED ORDER — SODIUM CHLORIDE 0.9 % IV SOLN: 100.0000 mL | INTRAVENOUS | Status: DC | PRN

## 2017-04-07 MED ORDER — ALTEPLASE 2 MG IJ SOLR: 2.0000 mg | Freq: Once | INTRAMUSCULAR | Status: DC | PRN

## 2017-04-07 MED ORDER — PRO-STAT SUGAR FREE PO LIQD
30.0000 mL | Freq: Three times a day (TID) | ORAL | Status: DC
  Administered 2017-04-07 – 2017-04-27 (×51): 30 mL via ORAL
  Administered 2017-04-28: 22:00:00 via ORAL
  Administered 2017-04-28 – 2017-04-30 (×7): 30 mL via ORAL
  Filled 2017-04-07 (×60): qty 30

## 2017-04-07 MED ORDER — LIDOCAINE-PRILOCAINE 2.5-2.5 % EX CREA: 1.0000 "application " | TOPICAL_CREAM | CUTANEOUS | Status: DC | PRN

## 2017-04-07 MED ORDER — PENTAFLUOROPROP-TETRAFLUOROETH EX AERO: 1.0000 "application " | INHALATION_SPRAY | CUTANEOUS | Status: DC | PRN

## 2017-04-07 MED ORDER — HEPARIN SODIUM (PORCINE) 1000 UNIT/ML DIALYSIS: 1000.0000 [IU] | INTRAMUSCULAR | Status: DC | PRN

## 2017-04-07 MED ORDER — LIDOCAINE HCL (PF) 1 % IJ SOLN: 5.0000 mL | INTRAMUSCULAR | Status: DC | PRN

## 2017-04-07 NOTE — Progress Notes (Signed)
PULMONARY / CRITICAL CARE MEDICINE   Name: [name] MRN: [id number] DOB: May 11, 1981    ADMISSION DATE:  02/14/2017 CONSULTATION DATE:  02/14/2017  REFERRING MD:  Feliz Beam MD  CHIEF COMPLAINT:  Sepsis, cardiac arrest  BRIEF SUMMARY:   36 year old female w/ ESRD d/t poorly controlled DM, bilateral transmetatarsal foot amputations, obesity, HTN, medical non-compliance as well as NICM. Admitted 5/19 with complaints of left leg pain, found to have radiological evidence of osteomyelitis with wound nonhealing, dehiscence, purulent drainage from the wound. Admitted for IV antibiotics, hemodialysis and possible surgery.  Found unresponsive in the room on 5/19. She had chest compressions, epi bicarb given. She was intubated by anesthesia and had ROSC in approximately 9 minutes. Downtime prior to code is unknown. She got dilaudid in the afternoon for leg pain.  SUBJECTIVE:  Off vent since 6/18. Tolerating HD with adequate BP. In NAD in HD unit.   VITAL SIGNS: BP (!) 148/104   Pulse (!) 110   Temp 98.3 F (36.8 C)   Resp (!) 23   Ht 5' 8.5" (1.74 m)   Wt 171 lb 15.3 oz (78 kg)   LMP  (LMP Unknown) Comment: Patient trached  SpO2 95%   BMI 25.77 kg/m   VENTILATOR SETTINGS: FiO2 (%):  [28 %] 28 %  INTAKE / OUTPUT: I/O last 3 completed shifts: In: 100 [IV Piggyback:100] Out: 1 [Stool:1]  General:  Chronically ill appearing female, NAD in HD unit undergoing HD, sleeping HEENT: MM pink/moist, trach site c/d/i, secure, normocephalic, atraumatic Neuro:  Asleep, arouses to call of name,  tracks appropriately but does not follow commands  CV: s1s2 rrr, no m/r/g PULM: even/non-labored, diminished bases , to TC 28% FE:OFHQ, non-tender, bsx4 active  Extremities: warm/dry, 1+ BLE edema, partial foot amputations Skin: CDI, No rash or lesions noted  LABS:  BMET  Recent Labs Lab 04/05/17 0601 04/06/17 0443 04/07/17 0427  NA 136 137 137  K 3.8 4.7 4.8  CL 96* 95* 94*  CO2 27 26 27    BUN 14 32* 53*  CREATININE 2.83* 4.48* 5.63*  GLUCOSE 132* 109* 120*   Electrolytes  Recent Labs Lab 04/04/17 0417 04/05/17 0601 04/06/17 0443 04/07/17 0427  CALCIUM 8.8* 8.7* 8.7* 8.7*  PHOS 5.5*  --  5.6* 6.0*   CBC  Recent Labs Lab 04/05/17 0601 04/06/17 0443 04/07/17 0427  WBC 16.9* 19.7* 16.4*  HGB 8.4* 7.8* 7.3*  HCT 29.5* 27.6* 25.1*  PLT 459* 457* 423*   No results for input(s): PHART, PCO2ART, PO2ART in the last 168 hours.  Liver Enzymes  Recent Labs Lab 04/02/17 0709  04/05/17 0601 04/06/17 0443 04/07/17 0427  AST 18  --  19  --   --   ALT 12*  --  13*  --   --   ALKPHOS 205*  --  234*  --   --   BILITOT 1.1  --  1.2  --   --   ALBUMIN 2.5*  < > 2.4* 2.3* 2.1*  < > = values in this interval not displayed. Glucose  Recent Labs Lab 04/06/17 0758 04/06/17 1149 04/06/17 1609 04/06/17 1930 04/06/17 2342 04/07/17 0414  GLUCAP 90 207* 121* 247* 132* 117*   STUDIES:  Lower extremity ultrasound 5/18 >> no evidence of DVT CT head 5/19 >> normal exam Echo 5/20 >> EF 30-35%, increased LVF. Diffuse hypokinesis, akinesis of basilar mid-inferior myocardium, grade 2 diastolic dysfxn  EEG 5/20 >> finding c/w mod to severe global cerebral  dysfxn. C/w anoxic injury given clinical course  LE [place] 5/21 >> negative MRI brain 5/24 >> ?thrombosed cortical vein, otherwise normal TEE 6/4 >> mild MR, normal AV, mild TR, mild PR, EF 30-35%, diffuse hypokinesis, no thrombus, no PFO, normal RV, moderate pericardial effusion with synechia suggesting some chronicity, no vegetations   CULTURES: Bcx 5/19 X 2 >> negative Blood cultures 6/1 >> negative c diff 6/1 >> negative  ANTIBIOTICS: Clinidamycin 5/19 >> 5/21 Vancomycin 5/19 >> 5/25 >> restart 5/30 Zosyn 5/19 >> 5/22 Meropenem 5/22 >> 5/30 Flagyl 5/30 >>off Ceftriaxone 5/30 >>off  SIGNIFICANT EVENTS: 5/19  Admit, cardiac arrest, transfer to ICU; hypothermia protocol started 5/24  Off pressors.  MRI w/out  evidence of ischemia  6/03  Tolerated ATC couple hours, then back to vent, HD   LINES/TUBES: ETT 5/19>>>5/31 Lt IJ CVL 5/19>>>out Trach 5/31>>>  ASSESSMENT / PLAN:  Discussion:  36 y/o F with PMH of poorly controlled DM, ESRD on HD and known gangrene of L achilles heel, admitted with non-healing wounds.  Admitted per TRH.  Received dilaudid for pain and subsequently suffered a cardiac arrest.  Trach dependent respiratory failure post cardiac arrest - currently with #4 uncuffed shiley Working with Speech with PMV while awake. Tolerating HD/ Hemodynamically stable  P: Maintain on TC as tolerated Titrate oxygen to maintain saturations > 94% Trach care per unit  protocol PT/ Mobilize  as able Intermittent CXR Speech following:  Dysphagia 2 Diet as recommended  PMV while awake with direct observation   PCCM will continue to f/u once weekly, please call sooner if needed.    Bevelyn Ngo, AGACNP-BC [facility] Pager:  [phone] 04/07/2017, 10:21 AM

## 2017-04-07 NOTE — Progress Notes (Signed)
Nutrition Follow-up  DOCUMENTATION CODES:   Obesity unspecified  INTERVENTION:   -D/c Nepro Shake, due to poor acceptance -Increase 30 ml Prostat to TID, each supplement provides 100 kcals and 15 grams protein  -Continue rena-vit  NUTRITION DIAGNOSIS:   Increased nutrient needs related to wound healing as evidenced by estimated needs.  Ongoing  GOAL:   Patient will meet greater than or equal to 90% of their needs  Progressing  MONITOR:   PO intake, Supplement acceptance, Diet advancement, Labs, Weight trends, Skin, I & O's  REASON FOR ASSESSMENT:   Consult Enteral/tube feeding initiation and management  ASSESSMENT:   36 yo Female w/ ESRD d/t poorly controlled DM, bilateral transmetatarsal foot amputations, obesity, HTN, medical non-compliance as well as NICM. Admitted with complaints of left leg pain, found to have radiological evidence of osteomyelitis with wound nonhealing, dehiscence, purulent drainage from the wound. Admitted for IV antibiotics, hemodialysis and possible surgery. Pt with nonhealing L foot wound, amputation recommended.   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   Per [facility] note on 04/07/17, rt heel wound has progressed to unstageable.   Pt's intake continues to improve. Noted 75-100% of meal completion. Pt family is very supportive and working towards self-feeding. Pt is refusing Nepro supplements, however, taking Prostat.   Labs reviewed: Phos: 6.0 (on calcium carbonate binder), CBGS: 117-247.   Diet Order:  DIET DYS 2 Room service appropriate? Yes with Assist; Fluid consistency: Nectar Thick  Skin:  Wound (see comment) (lt AKA w vac, UN rt heel, MASD anus)  Last BM:  04/06/17  Height:   Ht Readings from Last 1 Encounters:  02/14/17 5' 8.5" (1.74 m)     Weight:   Wt Readings from Last 1 Encounters:  04/07/17 171 lb 15.3 oz (78 kg)    Ideal Body Weight:  63.6 kg  BMI:  Body mass index is 25.77 kg/m.  Estimated Nutritional Needs:   Kcal:  2100-2300  Protein:  110-130 grams  Fluid:  Per MD  EDUCATION NEEDS:   No education needs identified at this time  [name] Knife, RD, LDN, CDE Pager: [phone] After hours Pager: [phone]

## 2017-04-07 NOTE — Progress Notes (Signed)
[facility] PROGRESS NOTE  [name] YBF:[id number] DOB: 07-02-1981 DOA: 02/14/2017  PCP: Vicenta Aly, FNP  Brief History/Interval Summary: Patient is a 36 y.o. female with history of ESRD on HD, poorly controlled diabetes, history of bilateral transmetatarsal foot amputation with subsequent left foot wound dehiscence (refused BKA as outpatient), chronic systolic heart failure felt to be secondary to nonischemic cardiomyopathy admitted on 02/14/17 with wound dehiscence and persistent drainage from the left trans-metatarsal amputation site. She was [facility]. She was subsequently found to be unresponsive in her room on 5/19 after getting IV Dilaudid-she was  in asystole, CPR was started, with ROSC in around 9 minutes.She was then transferred to the intensive care unit, and subsequently underwent cooling and then rewarming. She was unable to be liberated off the ventilator for several weeks , and as a result underwent tracheostomy on 5/31. Patient has been evaluated by neurology during this hospital stay, per neurology chances of meaningful neurological recovery was dismal. Hospital course has now been complicated by persistent leukocytosis, respiratory failure, and wound dehiscence with gangrene causing persistent SIRS pathophysiology and now suspected aspiration PNA. 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 Since then the patient has been weaned off the vent and maintained on trach collar. Low blood pressures have been an issue , especially with respect to her need for dialysis. Palliative medicine is following.   Consultants:   Infectious disease:  Neurology  Gastroenterology  Wound care  PCCM  Nephrology  Orthopedics  Procedures:   Transthoracic echocardiogram 5/20 Study Conclusions  - Left ventricle: The cavity size was normal. Wall thickness was   increased in a pattern of mild LVH. Systolic function was   moderately to severely reduced. The  estimated ejection fraction   was in the range of 30% to 35%. Diffuse hypokinesis. There is   akinesis of the basal-midinferior myocardium. Features are   consistent with a pseudonormal left ventricular filling pattern,   with concomitant abnormal relaxation and increased filling   pressure (grade 2 diastolic dysfunction). - Mitral valve: There was moderate regurgitation. Valve area by   pressure half-time: 2.44 cm^2. - Left atrium: The atrium was mildly dilated. - Right ventricle: The cavity size was mildly dilated. Wall   thickness was normal. - Tricuspid valve: There was mild regurgitation. - Pulmonary arteries: Systolic pressure was mildly increased. PA   peak pressure: 43 mm Hg (S). - Pericardium, extracardiac: A moderate pericardial effusion was   identified circumferential to the heart. . Largest pocket of   fluid is posterior to right atrium (2cm). No evidence of   tamponade physiology.  Transthoracic echocardiogram 6/28 Study Conclusions  - Left ventricle: The cavity size was normal. There was mild   concentric hypertrophy. Systolic function was severely reduced.   The estimated ejection fraction was in the range of 25% to 30%.   Diffuse hypokinesis. Regional wall motion abnormalities cannot be   excluded. - Mitral valve: There was mild regurgitation. - Pericardium, extracardiac: A trivial pericardial effusion was   identified circumferential to the heart.  Left above-knee amputation. 6/15  Antibiotics: She has previously completed multiple courses of antibiotics. Was started back on ceftazidime 6/29. Changed over to Zosyn to cover anaerobic organisms on 6/30 for possible Aspiration PNA .  Subjective/Interval History:  No big change in the clinical picture the patient is aphasic not communicative she follows you with her eyes and make some purposeful movements she moves all her upper and lower extremities.  Objective:  Vital  Signs  Vitals:   04/07/17 0810  04/07/17 0815 04/07/17 0900 04/07/17 0930  BP: (!) 130/93 115/80 (!) 146/100 (!) 148/104  Pulse: (!) 103 (!) 115 (!) 105 (!) 110  Resp: (!) 24 (!) 28 (!) 23 (!) 23  Temp:      TempSrc:      SpO2:      Weight:      Height:        Intake/Output Summary (Last 24 hours) at 04/07/17 1017 Last data filed at 04/07/17 0220  Gross per 24 hour  Intake              100 ml  Output                1 ml  Net               99 ml   Filed Weights   04/05/17 0426 04/07/17 0416 04/07/17 0745  Weight: 79 kg (174 lb 2.6 oz) 81 kg (178 lb 9.2 oz) 78 kg (171 lb 15.3 oz)     General appearance: NAD. Resp: CTAB. Cardio: S1, S2 is normal, regular. No S3, S4. No rubs, murmurs or bruit GI: Abdomen is soft, nontender, nondistended. Bowel sounds are present. No masses or organomegaly Extremities: Left AKA. Wound dehiscence has been revised, RIGHT HEEL Decub ulcer 4*5 cm , dry. Neurologic: Awake, alert. Aphasic, MOVES her extremities. Lab Results:  Data Reviewed: I have personally reviewed following labs and imaging studies  CBC:  Recent Labs Lab 04/02/17 0709 04/04/17 0417 04/05/17 0601 04/06/17 0443 04/07/17 0427  WBC 13.7* 12.5* 16.9* 19.7* 16.4*  HGB 8.7* 8.5* 8.4* 7.8* 7.3*  HCT 31.2* 29.8* 29.5* 27.6* 25.1*  MCV 100.3* 100.3* 99.3 99.3 97.7  PLT 445* 489* 459* 457* 423*    Basic Metabolic Panel:  Recent Labs Lab 04/02/17 0709 04/04/17 0417 04/05/17 0601 04/06/17 0443 04/07/17 0427  NA 141 137 136 137 137  K 4.0 4.1 3.8 4.7 4.8  CL 98* 97* 96* 95* 94*  CO2 _0 GLUCOSE 119* 95 132* 109* 120*  BUN 41* 22* 14 32* 53*  CREATININE 4.94* 3.86* 2.83* 4.48* 5.63*  CALCIUM 8.9 8.8* 8.7* 8.7* 8.7*  PHOS  --  5.5*  --  5.6* 6.0*    GFR: Estimated Creatinine Clearance: 14.1 mL/min (A) (by C-G formula based on SCr of 5.63 mg/dL (H)).  Liver Function Tests:  Recent Labs Lab 04/02/17 0709 04/04/17 0417 04/05/17 0601 04/06/17 0443 04/07/17 0427  AST 18  --  19  --    --   ALT 12*  --  13*  --   --   ALKPHOS 205*  --  234*  --   --   BILITOT 1.1  --  1.2  --   --   PROT 7.0  --  7.0  --   --   ALBUMIN 2.5* 2.7* 2.4* 2.3* 2.1*     CBG:  Recent Labs Lab 04/06/17 1149 04/06/17 1609 04/06/17 1930 04/06/17 2342 04/07/17 0414  GLUCAP 207* 121* 247* 132* 117*     No results found for this or any previous visit (from the past 240 hour(s)).    Radiology Studies: Dg Chest Port 1 View  Result Date: 04/05/2017 CLINICAL DATA:  SOB. Hx diabetes, HTN, PNA, CAD, COPD. EXAM: PORTABLE CHEST 1 VIEW COMPARISON:  03/29/2017 FINDINGS: Tracheostomy is unchanged in position. Numerous leads and wires project over the chest. Midline trachea. Cardiomegaly accentuated by AP  portable technique. No pleural fluid. No pneumothorax. Moderate interstitial prominence and indistinctness. Improved right-sided aeration with patchy left mid and lower lung airspace disease developing. IMPRESSION: Cardiomegaly with similar moderate interstitial prominence, suspicious for pulmonary edema. Shifting airspace opacities which could represent infection or alveolar edema. Electronically Signed   By: Abigail Miyamoto M.D.   On: 04/05/2017 11:24     Medications:  Scheduled: . calcium carbonate (dosed in mg elemental calcium)  250 mg of elemental calcium Oral TID WC  . chlorhexidine gluconate (MEDLINE KIT)  15 mL Mouth Rinse BID  . darbepoetin (ARANESP) injection - DIALYSIS  200 mcg Intravenous Q Thu-HD  . dextrose  1 ampule Intravenous Once  . docusate sodium  100 mg Oral BID  . feeding supplement (NEPRO CARB STEADY)  237 mL Oral BID BM  . feeding supplement (PRO-STAT SUGAR FREE 64)  30 mL Oral BID  . [name]'s butt cream   Topical QID  . heparin  5,000 Units Subcutaneous Q8H  . insulin aspart  0-15 Units Subcutaneous Q4H  . insulin glargine  8 Units Subcutaneous Daily  . mouth rinse  15 mL Mouth Rinse QID  . midodrine  10 mg Oral Q T,Th,Sa-HD  . multivitamin  1 tablet Oral QHS  .  pantoprazole sodium  40 mg Per Tube Q24H  . saccharomyces boulardii  250 mg Oral BID   Continuous: . sodium chloride    . sodium chloride    . albumin human    . methocarbamol (ROBAXIN)  IV    . piperacillin-tazobactam (ZOSYN)  IV Stopped (04/07/17 0220)   LGX:QJJHER chloride, sodium chloride, acetaminophen **OR** acetaminophen, albuterol, alteplase, bisacodyl, fentaNYL (SUBLIMAZE) injection, heparin, HYDROmorphone (DILAUDID) injection, lidocaine (PF), lidocaine-prilocaine, loperamide, magnesium citrate, methocarbamol **OR** methocarbamol (ROBAXIN)  IV, metoCLOPramide **OR** metoCLOPramide (REGLAN) injection, metoprolol tartrate, ondansetron **OR** ondansetron (ZOFRAN) IV, pentafluoroprop-tetrafluoroeth, polyethylene glycol, RESOURCE THICKENUP CLEAR  Assessment/Plan:  Principal Problem:   Acute on chronic respiratory failure with hypoxemia (HCC) Active Problems:   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)   Normocytic anemia   Hyperlipidemia   Hypertension   Non-ischemic cardiomyopathy - by echo 8/16- EF 35-40%   Morbid obesity-    Acute combined systolic and diastolic heart failure (HCC)   ESRD (end stage renal disease) ([place])   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)   Diabetic osteomyelitis ([place])   Dehiscence of amputation stump (HCC)   Osteomyelitis (HCC)   Chronic pain syndrome   Cardiac arrest, cause unspecified ([place])   Pressure injury of skin   Wound dehiscence   Coma ([place])   Elevated liver enzymes   Jaundice   Encounter for central line placement   Protein calorie malnutrition ([place])   DNR (do not resuscitate) discussion   Palliative care by specialist   Tachycardia   Goals of care, counseling/discussion   Anoxic brain injury [facility])   Palliative care encounter   Tracheostomy status ([place])   Abscess of left leg   Hypoxemia   Fever   Aspiration into airway    1-Acute /chronic hypoxemic respiratory failure in a setting of cardiac arrest  :s/p intubation then trach .  Last ventilator use was on night of 6/19-6/20. On TC for several days. PCCM following for resp issues -tracheostomy in place. Change to #4 cuffless trach. S/P MBS 6/23. Now status post FEES 6/27. Patient started on nectar thick liquids and dysphagia 1 diet 6/27. Holding off on PEG for now. Cortrak removed 6/28. Unfortunately patient has aspirated again. She was febrile  and tachypneic. Repeat chest x-ray on 6/29 showed new right-sided infiltrate. She was started on [place] on 6/29 and changed over to Zosyn to provide anaerobic coverage. Seems to be stable. Chest x-ray from 7/1 showed improving opacity in the right lung. The patient has improved clinically from the pulmonary standpoint , will conitnue Zosyn till July 13 to complete 14 days coverage , one dose of Vanc given yesterday as her Frederika were trending up with her decub ulcer I had to cover G+cocci .   2-ESRD On HD per Nephrology since June 17 , Tolerating it well, continue to follow up with Nephrology .  3-Left trans-metatarsal stump wound dehiscence with infection and gangrene Per operative note-pus/abscess noted extending up to the knee when BKA was attempted, subsequently underwent a AKA. Note, initially family was very reluctant to proceed with amputation, but given persistent leukocytosis/fever and potential risk of sepsis, family agreed for amputation, subsequently AKA was done on 6/15. Orthopedics removed wound VAC 6/20. Seen by orthopedics.revision of her left AKA happened 04/02/17 ,also the Wound care nurse following for the pressure wound on the right heel.  Initial infections were due to left foot gangrene and abscess. Now, she status post AKA. Blood cultures have been done multiple times and have been negative so far. She has had C. difficile testing, which was also been negative. CT scan did not show any PE. She's completed multiple courses of antibiotics. Most recently started on [place] on 6/29 and changed to  Zosyn on 6/30, stop date 04/10/17.   4-Anoxic brain injury secondary to Cardiac arrest on 5/19: Patient has shown some cognitive improvement over the last many days. She has been noted to follow certain commands at times. Her prognosis remains guarded. She has been seen by neurology during this hospitalization, she continues to show some sign of improvement , family is full of hope but my expectation is limited.  5-Diarrhea, continues to have loose stools Continue probiotics and Imodium as needed. Marland Kitchen  6-Normocytic anemia Likely secondary to chronic disease-probably worsened by acute illness. No signs of bleeding, hemoglobin has been stable with some fluctuations.  7-Diabetes mellitus type 2  Continue with SSI. On Lantus. HbA1c 5.9.  8-Chronic systolic heart failure/nonischemic cardiomyopathy Volume was being managed with hemodialysis. Patient was placed on beta blocker, but she has not been able to due to low blood pressure. Coreg has been discontinued. Echocardiogram reports as above. EF 25-30% per last echocardiogram, remains compensated .  9-Pericardial effusion Seen on TEE-with no evidence of tamponade pathophysiology. Repeat echo 6/26 with no significant pericardial effusion.  10-Moderate protein calorie malnutrition Started orals 6/27. Aspiration precautions.   Goals of care Prognosis is guarded.  DVT Prophylaxis: Subcutaneous heparin    Code Status: Full code  Family Communication: Discussed with the mother  Disposition Plan: Management as outlined above.    Shayden Gingrich,MD .  [facility] Pager [phone] 04/07/2017, 10:17 AM  If 7PM-7AM, please contact night-coverage at [email or web address], password [facility]

## 2017-04-07 NOTE — Procedures (Signed)
  I was present at this dialysis session, have reviewed the session itself and made  appropriate changes Vinson Moselle MD [facility] pager [phone]   04/07/2017, 11:27 AM

## 2017-04-07 NOTE — Consult Note (Addendum)
WOC Nurse wound consult note Ortho service following for assessment and plan of care to left stump surgical site.  Reason for Consult: Right heel with previous deep tissue injury has evolved into an unstageable pressure injury. Pressure Injury POA: No Measurement: 4.5X5cm Wound bed: 100% dry eschar; no odor, drainage, or fluctuance. Dressing procedure/placement/frequency: Prevalon boot is in place to reduce pressure.  Foam dressing to protect from further injury.  It is best practice to leave dry stable eschar in place. No topical treatment is indicated at this time; will plan to re-assess weekly. No family present at the bedside.  Cammie Mcgee MSN, RN, CWOCN, [place], CNS [phone].

## 2017-04-08 DIAGNOSIS — E785 Hyperlipidemia, unspecified: Secondary | ICD-10-CM

## 2017-04-08 DIAGNOSIS — I950 Idiopathic hypotension: Secondary | ICD-10-CM

## 2017-04-08 DIAGNOSIS — E43 Unspecified severe protein-calorie malnutrition: Secondary | ICD-10-CM

## 2017-04-08 DIAGNOSIS — Z794 Long term (current) use of insulin: Secondary | ICD-10-CM

## 2017-04-08 LAB — CBC
HCT: 28.2 % — ABNORMAL LOW (ref 36.0–46.0)
Hemoglobin: 7.9 g/dL — ABNORMAL LOW (ref 12.0–15.0)
MCH: 27.7 pg (ref 26.0–34.0)
MCHC: 28 g/dL — ABNORMAL LOW (ref 30.0–36.0)
MCV: 98.9 fL (ref 78.0–100.0)
PLATELETS: 472 10*3/uL — AB (ref 150–400)
RBC: 2.85 MIL/uL — AB (ref 3.87–5.11)
RDW: 17.6 % — AB (ref 11.5–15.5)
WBC: 13.4 10*3/uL — AB (ref 4.0–10.5)

## 2017-04-08 LAB — GLUCOSE, CAPILLARY
GLUCOSE-CAPILLARY: 100 mg/dL — AB (ref 65–99)
GLUCOSE-CAPILLARY: 87 mg/dL (ref 65–99)
GLUCOSE-CAPILLARY: 205 mg/dL — AB (ref 65–99)
Glucose-Capillary: 148 mg/dL — ABNORMAL HIGH (ref 65–99)
Glucose-Capillary: 171 mg/dL — ABNORMAL HIGH (ref 65–99)

## 2017-04-08 LAB — COMPREHENSIVE METABOLIC PANEL
ALBUMIN: 2.2 g/dL — AB (ref 3.5–5.0)
ALT: 12 U/L — AB (ref 14–54)
AST: 19 U/L (ref 15–41)
Alkaline Phosphatase: 226 U/L — ABNORMAL HIGH (ref 38–126)
Anion gap: 13 (ref 5–15)
BUN: 29 mg/dL — AB (ref 6–20)
CHLORIDE: 97 mmol/L — AB (ref 101–111)
CO2: 27 mmol/L (ref 22–32)
CREATININE: 3.61 mg/dL — AB (ref 0.44–1.00)
Calcium: 8.7 mg/dL — ABNORMAL LOW (ref 8.9–10.3)
GFR calc Af Amer: 17 mL/min — ABNORMAL LOW (ref >60)
GFR, EST NON AFRICAN AMERICAN: 15 mL/min — AB (ref >60)
Glucose, Bld: 99 mg/dL (ref 65–99)
POTASSIUM: 4.5 mmol/L (ref 3.5–5.1)
SODIUM: 137 mmol/L (ref 135–145)
Total Bilirubin: 1.1 mg/dL (ref 0.3–1.2)
Total Protein: 6.9 g/dL (ref 6.5–8.1)

## 2017-04-08 MED ORDER — INSULIN ASPART 100 UNIT/ML ~~LOC~~ SOLN
0.0000 [IU] | Freq: Three times a day (TID) | SUBCUTANEOUS | Status: DC
  Administered 2017-04-08: 2 [IU] via SUBCUTANEOUS
  Administered 2017-04-08 – 2017-04-09 (×2): 5 [IU] via SUBCUTANEOUS
  Administered 2017-04-09: 2 [IU] via SUBCUTANEOUS
  Administered 2017-04-10: 3 [IU] via SUBCUTANEOUS
  Administered 2017-04-10: 2 [IU] via SUBCUTANEOUS
  Administered 2017-04-12: 3 [IU] via SUBCUTANEOUS
  Administered 2017-04-12: 2 [IU] via SUBCUTANEOUS
  Administered 2017-04-13: 5 [IU] via SUBCUTANEOUS
  Administered 2017-04-15: 3 [IU] via SUBCUTANEOUS
  Administered 2017-04-17 – 2017-04-18 (×2): 2 [IU] via SUBCUTANEOUS
  Administered 2017-04-19 – 2017-04-20 (×2): 3 [IU] via SUBCUTANEOUS
  Administered 2017-04-20: 2 [IU] via SUBCUTANEOUS
  Administered 2017-04-21: 3 [IU] via SUBCUTANEOUS
  Administered 2017-04-22 – 2017-04-24 (×4): 2 [IU] via SUBCUTANEOUS
  Administered 2017-04-26: 3 [IU] via SUBCUTANEOUS
  Administered 2017-04-26 – 2017-04-28 (×3): 2 [IU] via SUBCUTANEOUS
  Administered 2017-04-29: 3 [IU] via SUBCUTANEOUS

## 2017-04-08 MED ORDER — NYSTATIN 100000 UNIT/ML MT SUSP
5.0000 mL | Freq: Four times a day (QID) | OROMUCOSAL | Status: DC
  Administered 2017-04-08 – 2017-04-14 (×19): 500000 [IU] via ORAL
  Filled 2017-04-08 (×19): qty 5

## 2017-04-08 MED ORDER — FLUCONAZOLE IN SODIUM CHLORIDE 100-0.9 MG/50ML-% IV SOLN
100.0000 mg | INTRAVENOUS | Status: DC
  Administered 2017-04-08 – 2017-04-12 (×5): 100 mg via INTRAVENOUS
  Filled 2017-04-08 (×5): qty 50

## 2017-04-08 NOTE — Progress Notes (Signed)
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   Admitted on 02/14/17 to [facility] with left foot/leg pain, wound dehiscence and purulent drainage from the left trans-metatarsal amputation site.   She was subsequently found to be unresponsive on 5/19. Noted to bein asystole and CPR started with ROSC in approximately 9 minutes. She was intubated by anaesthesia, transferred to the intensive care unit and subsequently underwent cooling protocol. She was unable to be liberated off the ventilator and as a result underwent tracheostomy on 5/31.  Neurological/ Cognitive status has been poor. She has been evaluated by neurology on 5/29 and, per neurology, chances of meaningful neurological recovery are dismal.  Hospital course complicated by persistent leukocytosis, respiratory failure and left transmetatarsal wound dehiscence with gangrene. 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  She has subsequently had recurrent aspiration pneumonias.  CXR on 6/29 showed a RLL infiltrate.  Subjective: Evaluated with RN and mother in room. No complaints.   Assessment & Plan:  Acute /chronichypoxemic vent dependent respiratory failure in a setting of cardiac arrest - Intubated 6/19-6/20.  Now with tracheostomy.  PCCM following for resp issues -tracheostomy in place. Change to #4 cuffless trach.  - S/P MBS 6/23. Now status post FEES 6/27. Patient started on nectar thick liquids and dysphagia 1 diet 6/27. Holding off on PEG   - has had aspiration issues- recurrent aspiration on 6/28-  Repeat chest x-ray on 6/29 showed new right-sided infiltrate - She was started on  [place] on 6/29 and changed over to [place] on 6/30   - Chest x-ray from 7/1 showed improving opacity in the right lung. - using passy-muir valve while awake   ESRD On HD TTS  per Nephrology    Left trans-metatarsal stump wound dehiscence with infection and gangrene Per operative note-pus/abscess noted extending up to the knee when BKA was attempted, subsequently underwent a AKA. Note, initially family was very reluctant to proceed with amputation, but given persistent leukocytosis/fever and potential risk of sepsis, family agreed for amputation - underwent AKA 6/15 -Orthopedics removed wound VAC 6/20- revision of her left AKA  04/03/17   - currently with wound vac- surgery plans to leave in for 1 week after surgery  Right heel eschar - wound care RN following  Decubitus ulcers - air mattress- wound care per nursing staff  Diabetes mellitus - cont Lantus 8 U and SSI with meals  Thrush - start Nystatin and Diflucan today   Anoxic brain injury secondary to Cardiac arrest on 5/19: - slow cognitive improvement- following command, talking  Diarrhea  Continue probiotics and Imodium as needed. .   Normocytic anemia Likely secondary to chronic disease-probably worsened by acute illness. No signs of bleeding, hemoglobin has been stable with some fluctuations.   Diabetes mellitus type 2  Continue with SSI. On Lantus. HbA1c 5.9.   Chronic systolic heart failure/nonischemic cardiomyopathy - Echocardiogram>> EF 25-30% with diffuse hypokinesis - volume management with dialysis - unable to tolerate Coreg due to hypotension    Hypotension - Midodrine with dialysis   Pericardial effusion Seen on TEE-with no evidence of tamponade pathophysiology. Repeat echo 6/26 with no significant pericardial effusion.   Moderate protein calorie malnutrition Started orals 6/27. Aspiration  precautions  Depression?  - has been on Cymbalta at home which is currently being held    DVT  prophylaxis: Heparin Code Status: Full code Family Communication: mother  Disposition Plan: to be determined Consultants:  Nephrology, pulmonary, palliative care, ID, Critical care, Ortho Procedures:  Transthoracic echocardiogram 6/28 Study Conclusions  - Left ventricle: The cavity size was normal. There was mild concentric hypertrophy. Systolic function was severely reduced. The estimated ejection fraction was in the range of 25% to 30%. Diffuse hypokinesis. Regional wall motion abnormalities cannot be excluded. - Mitral valve: There was mild regurgitation. - Pericardium, extracardiac: A trivial pericardial effusion was identified circumferential to the heart.  Left above-knee amputation. 6/15  Antimicrobials:  Anti-infectives    Start     Dose/Rate Route Frequency Ordered Stop   04/04/17 0600  ceFAZolin (ANCEF) IVPB 2g/100 mL premix  Status:  Discontinued     2 g 200 mL/hr over 30 Minutes Intravenous On call to O.R. 04/03/17 1840 04/03/17 1848   03/28/17 1600  cefTAZidime (FORTAZ) 1 g in dextrose 5 % 50 mL IVPB  Status:  Discontinued     1 g 100 mL/hr over 30 Minutes Intravenous Every 24 hours 03/27/17 0814 03/28/17 0752   03/28/17 1100  piperacillin-tazobactam (ZOSYN) IVPB 3.375 g     3.375 g 12.5 mL/hr over 240 Minutes Intravenous Every 12 hours 03/28/17 1038 04/11/17 1359   03/27/17 0815  cefTAZidime (FORTAZ) 1 g in dextrose 5 % 50 mL IVPB     1 g 100 mL/hr over 30 Minutes Intravenous  Once 03/27/17 0814 03/27/17 1026   03/17/17 1800  cefTAZidime (FORTAZ) 2 g in dextrose 5 % 50 mL IVPB  Status:  Discontinued     2 g 100 mL/hr over 30 Minutes Intravenous Every T-Th-Sa (1800) 03/15/17 1143 03/18/17 1223   03/17/17 1200  vancomycin (VANCOCIN) IVPB 1000 mg/200 mL premix  Status:  Discontinued     1,000 mg 200 mL/hr over 60 Minutes Intravenous Every T-Th-Sa (Hemodialysis) 03/15/17 1143 03/18/17 1223   03/14/17 1200  vancomycin (VANCOCIN) IVPB 1000 mg/200 mL  premix  Status:  Discontinued     1,000 mg 200 mL/hr over 60 Minutes Intravenous Every T-Th-Sa (Hemodialysis) 03/12/17 0912 03/14/17 1542   03/13/17 1430  ceFAZolin (ANCEF) IVPB 2g/100 mL premix  Status:  Discontinued     2 g 200 mL/hr over 30 Minutes Intravenous To ShortStay Surgical 03/12/17 1208 03/13/17 1113   03/13/17 1115  ceFAZolin (ANCEF) IVPB 2g/100 mL premix     2 g 200 mL/hr over 30 Minutes Intravenous To Surgery 03/13/17 1108 03/13/17 1506   03/12/17 1800  cefTAZidime (FORTAZ) 2 g in dextrose 5 % 50 mL IVPB  Status:  Discontinued     2 g 100 mL/hr over 30 Minutes Intravenous Every T-Th-Sa (1800) 03/12/17 0912 03/14/17 1542   03/12/17 1200  vancomycin (VANCOCIN) 1,500 mg in sodium chloride 0.9 % 250 mL IVPB     1,500 mg 250 mL/hr over 60 Minutes Intravenous Every Thu (Hemodialysis) 03/12/17 0912 03/12/17 1356   03/02/17 1200  cefTRIAXone (ROCEPHIN) 2 g in dextrose 5 % 50 mL IVPB     2 g 100 mL/hr over 30 Minutes Intravenous Every 24 hours 03/02/17 0810 03/06/17 1358   03/02/17 1200  vancomycin (VANCOCIN) IVPB 1000 mg/200 mL premix     1,000 mg 200 mL/hr over 60 Minutes Intravenous Every M-W-F (Hemodialysis) 03/02/17 0815 03/02/17 1457   03/02/17 1200  metroNIDAZOLE (FLAGYL) IVPB 500 mg  500 mg 100 mL/hr over 60 Minutes Intravenous Every 8 hours 03/02/17 0948 03/06/17 2230   02/26/17 1200  vancomycin (VANCOCIN) IVPB 1000 mg/200 mL premix     1,000 mg 200 mL/hr over 60 Minutes Intravenous Every T-Th-Sa (Hemodialysis) 02/25/17 1151 03/05/17 1325   02/25/17 1200  vancomycin (VANCOCIN) 2,000 mg in sodium chloride 0.9 % 500 mL IVPB     2,000 mg 250 mL/hr over 120 Minutes Intravenous  Once 02/25/17 1149 02/25/17 1449   02/25/17 0900  cefTRIAXone (ROCEPHIN) 2 g in dextrose 5 % 50 mL IVPB  Status:  Discontinued     2 g 100 mL/hr over 30 Minutes Intravenous Every 24 hours 02/25/17 0814 03/02/17 0810   02/25/17 0900  metroNIDAZOLE (FLAGYL) IVPB 500 mg  Status:  Discontinued      500 mg 100 mL/hr over 60 Minutes Intravenous Every 8 hours 02/25/17 0814 03/02/17 0948   02/20/17 1200  vancomycin (VANCOCIN) IVPB 1000 mg/200 mL premix  Status:  Discontinued     1,000 mg 200 mL/hr over 60 Minutes Intravenous Every M-W-F (Hemodialysis) 02/19/17 1506 02/20/17 1403   02/17/17 1800  meropenem (MERREM) 500 mg in sodium chloride 0.9 % 50 mL IVPB  Status:  Discontinued     500 mg 100 mL/hr over 30 Minutes Intravenous Daily-1800 02/17/17 1026 02/25/17 0814   02/17/17 1200  vancomycin (VANCOCIN) IVPB 1000 mg/200 mL premix     1,000 mg 200 mL/hr over 60 Minutes Intravenous Every T-Th-Sa (Hemodialysis) 02/14/17 1758 02/19/17 1612   02/14/17 2200  clindamycin (CLEOCIN) IVPB 900 mg  Status:  Discontinued     900 mg 100 mL/hr over 30 Minutes Intravenous Every 8 hours 02/14/17 1106 02/14/17 1928   02/14/17 2000  clindamycin (CLEOCIN) IVPB 900 mg  Status:  Discontinued     900 mg 100 mL/hr over 30 Minutes Intravenous Every 8 hours 02/14/17 1928 02/16/17 1047   02/14/17 1915  piperacillin-tazobactam (ZOSYN) IVPB 3.375 g  Status:  Discontinued     3.375 g 100 mL/hr over 30 Minutes Intravenous Every 12 hours 02/14/17 1801 02/17/17 1026   02/14/17 1845  vancomycin (VANCOCIN) 2,000 mg in sodium chloride 0.9 % 500 mL IVPB     2,000 mg 250 mL/hr over 120 Minutes Intravenous  Once 02/14/17 1758 02/14/17 2107   02/14/17 1115  clindamycin (CLEOCIN) IVPB 900 mg  Status:  Discontinued     900 mg 100 mL/hr over 30 Minutes Intravenous  Once 02/14/17 1106 02/14/17 2236   02/14/17 0945  clindamycin (CLEOCIN) IVPB 600 mg     600 mg 100 mL/hr over 30 Minutes Intravenous  Once 02/14/17 0931 02/14/17 1013       Objective: Vitals:   04/08/17 0400 04/08/17 0500 04/08/17 0600 04/08/17 0741  BP: 123/81     Pulse:    (!) 118  Resp: (!) 21   16  Temp:   98.4 F (36.9 C)   TempSrc:   Axillary   SpO2: 97%   99%  Weight:  85 kg (187 lb 6.3 oz)    Height:        Intake/Output Summary (Last 24  hours) at 04/08/17 0829 Last data filed at 04/08/17 0440  Gross per 24 hour  Intake              450 ml  Output                0 ml  Net  450 ml   Filed Weights   04/07/17 0416 04/07/17 0745 04/08/17 0500  Weight: 81 kg (178 lb 9.2 oz) 78 kg (171 lb 15.3 oz) 85 kg (187 lb 6.3 oz)    Examination: General exam: Appears comfortable  HEENT: PERRLA, oral mucosa moist, has severe thrush, no sclera icterus or thrush- trach intact Respiratory system: Clear to auscultation. Respiratory effort normal. Cardiovascular system: S1 & S2 heard, RRR.  No murmurs  Gastrointestinal system: Abdomen soft, non-tender, nondistended. Normal bowel sound. No organomegaly Central nervous system: Alert - follows some commands this morning- opens mouth when asked but not moving extremities when asked- normal tone in extermities Extremities: No cyanosis, clubbing or edema- left AKA with wound vac Skin: multiple ulcers on sacral area- no cellulitis- right heel eschar Psychiatry:  alert    Data Reviewed: I have personally reviewed following labs and imaging studies  CBC:  Recent Labs Lab 04/04/17 0417 04/05/17 0601 04/06/17 0443 04/07/17 0427 04/08/17 0333  WBC 12.5* 16.9* 19.7* 16.4* 13.4*  HGB 8.5* 8.4* 7.8* 7.3* 7.9*  HCT 29.8* 29.5* 27.6* 25.1* 28.2*  MCV 100.3* 99.3 99.3 97.7 98.9  PLT 489* 459* 457* 423* 161*   Basic Metabolic Panel:  Recent Labs Lab 04/04/17 0417 04/05/17 0601 04/06/17 0443 04/07/17 0427 04/07/17 1415 04/08/17 0333  NA 137 136 137 137 135 137  K 4.1 3.8 4.7 4.8 4.2 4.5  CL 97* 96* 95* 94* 96* 97*  CO2 _0 GLUCOSE 95 132* 109* 120* 113* 99  BUN 22* 14 32* 53* 18 29*  CREATININE 3.86* 2.83* 4.48* 5.63* 2.60* 3.61*  CALCIUM 8.8* 8.7* 8.7* 8.7* 8.6* 8.7*  PHOS 5.5*  --  5.6* 6.0*  --   --    GFR: Estimated Creatinine Clearance: 24.6 mL/min (A) (by C-G formula based on SCr of 3.61 mg/dL (H)). Liver Function Tests:  Recent Labs Lab  04/02/17 0709  04/05/17 0601 04/06/17 0443 04/07/17 0427 04/07/17 1415 04/08/17 0333  AST 18  --  19  --   --  28 19  ALT 12*  --  13*  --   --  8* 12*  ALKPHOS 205*  --  234*  --   --  194* 226*  BILITOT 1.1  --  1.2  --   --  1.0 1.1  PROT 7.0  --  7.0  --   --  6.5 6.9  ALBUMIN 2.5*  < > 2.4* 2.3* 2.1* 2.2* 2.2*  < > = values in this interval not displayed. No results for input(s): LIPASE, AMYLASE in the last 168 hours. No results for input(s): AMMONIA in the last 168 hours. Coagulation Profile: No results for input(s): INR, PROTIME in the last 168 hours. Cardiac Enzymes: No results for input(s): CKTOTAL, CKMB, CKMBINDEX, TROPONINI in the last 168 hours. BNP (last 3 results) No results for input(s): PROBNP in the last 8760 hours. HbA1C: No results for input(s): HGBA1C in the last 72 hours. CBG:  Recent Labs Lab 04/07/17 1256 04/07/17 1646 04/07/17 1959 04/07/17 2346 04/08/17 0400  GLUCAP 105* 118* 171* 115* 100*   Lipid Profile: No results for input(s): CHOL, HDL, LDLCALC, TRIG, CHOLHDL, LDLDIRECT in the last 72 hours. Thyroid Function Tests: No results for input(s): TSH, T4TOTAL, FREET4, T3FREE, THYROIDAB in the last 72 hours. Anemia Panel: No results for input(s): VITAMINB12, FOLATE, FERRITIN, TIBC, IRON, RETICCTPCT in the last 72 hours. Urine analysis:    Component Value Date/Time   COLORURINE AMBER (A) 02/14/2017  2111   APPEARANCEUR HAZY (A) 02/14/2017 2111   LABSPEC 1.015 02/14/2017 2111   PHURINE 5.0 02/14/2017 2111   GLUCOSEU NEGATIVE 02/14/2017 2111   HGBUR SMALL (A) 02/14/2017 2111   BILIRUBINUR NEGATIVE 02/14/2017 2111   [place] 02/14/2017 2111   PROTEINUR 30 (A) 02/14/2017 2111   UROBILINOGEN 0.2 09/21/2014 1908   NITRITE NEGATIVE 02/14/2017 2111   LEUKOCYTESUR TRACE (A) 02/14/2017 2111   Sepsis Labs: _0 (procalcitonin:4,lacticidven:4) )No results found for this or any previous visit (from the past 240 hour(s)).        Radiology Studies: No results found.    Scheduled Meds: . calcium carbonate (dosed in mg elemental calcium)  250 mg of elemental calcium Oral TID WC  . chlorhexidine gluconate (MEDLINE KIT)  15 mL Mouth Rinse BID  . darbepoetin (ARANESP) injection - DIALYSIS  200 mcg Intravenous Q Thu-HD  . dextrose  1 ampule Intravenous Once  . docusate sodium  100 mg Oral BID  . feeding supplement (PRO-STAT SUGAR FREE 64)  30 mL Oral TID  . [name]'s butt cream   Topical QID  . heparin  5,000 Units Subcutaneous Q8H  . insulin aspart  0-15 Units Subcutaneous Q4H  . insulin glargine  8 Units Subcutaneous Daily  . mouth rinse  15 mL Mouth Rinse QID  . midodrine  10 mg Oral Q T,Th,Sa-HD  . multivitamin  1 tablet Oral QHS  . pantoprazole sodium  40 mg Per Tube Q24H  . saccharomyces boulardii  250 mg Oral BID   Continuous Infusions: . albumin human    . piperacillin-tazobactam (ZOSYN)  IV Stopped (04/08/17 0440)     LOS: 53 days    Time spent in minutes: 35    Debbe Odea, MD [facility] Pager: [email or web address] Password [facility] 04/08/2017, 8:29 AM

## 2017-04-08 NOTE — Progress Notes (Signed)
[facility] Rounding Note  Subjective:  Sitting up, speaking, knows hospital and 2018.    Vital signs in last 24 hours: Vitals:   04/08/17 0600 04/08/17 0741 04/08/17 1100 04/08/17 1206  BP:      Pulse:  (!) 118  (!) 114  Resp:  16 (!) 23 (!) 23  Temp: 98.4 F (36.9 C)     TempSrc: Axillary     SpO2:  99%  100%  Weight:      Height:       Weight change: -3 kg (-6 lb 9.8 oz)  Intake/Output Summary (Last 24 hours) at 04/08/17 1343 Last data filed at 04/08/17 0440  Gross per 24 hour  Intake              450 ml  Output                0 ml  Net              450 ml   Physical Exam: Alert, smiling, talking Trach/trach collar Anteriorly coarse BS but o/w clear S1S2 No S3 HR 120 Abd obese, soft and non tender Left AKA with wound VAC in place No LE or hip edema Right foot in protective boot Dialysis Access: Left AVF + bruit and thrill  Dialysis: TTS [facility] 4h 32mn   F200  114kg   2/2 bath  LUA AVF  Hep none - Iron Sucrose (Venofer) 50 mg IVP During Dialysis 1X Week  - Mircera 75 mcg IV q  2 weeks ( last on 02/05/17) - Vitamin D (Calcitriol) Oral 0.75 mcg  Po q hd   Summary: 36year old female w/ ESRD d/t poorly controlled DM, HD bilateral TMA's, obesity, HTN, medical non-compliance, NICM. Admitted 5/19 w/left leg pain, + radiological evidence of osteomyelitis w/wound nonhealing, dehiscence, purulent drainage. Admitted for IV antibiotics, hemodialysis and possible surgery. Found unresponsive in her room that afternoon - cardiac arrest > rec'd  CPR, epi bicarb; intubated; cooled. Now with anoxic brain injury, trach and s/p L AKA this hosp. Course further complicated by recurrent aspiration PNA, dehiscence of AK wound requiring revision 7/6.   Assessment: 1. Anoxic brain injury sp arrest - pt is awake, speaking and eating. Still gets agitated / restless on HD.  2. Trach - on trach collar. Has pulled out twice herself in past week. Not sure what the plan is for decannulation. Not on vent  since 6/20.  3. Fever/ PNA /^WBC - recurrent asp'n 6/28. Zosyn since 6/30.  4. Hypotension/ tachycardia - Soft BP, volume status difficult to assess. Intolerant of standing doses of BB for tachycardia.  5. ESRD -  TTS HD, started HD June '17. HD hampered by hypotension. Added midodrine which has mitigated hypotension to some degree 6. Anemia- on max aranesp- hgb stable in 8's  7. MBD of CKD - Ca and Phos in range, on calc suspension. PTH 88 here.   8. HTN - soft BP's and tachy, no BP meds needed- on metoprolol prn only to keep hr under 120 9. 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   10. DM2 - per primary 11. sHF - EF 25-30% contributes to BP issues on HD 12. Nutrition- unclear if will be able to take enough in to meet needs, albumin 3.0 13. Dispo- difficult with ESRD and trach-  Medicaid so not sure LTAC is option.    Plan - HD on TTS schedule , upstairs   RKelly SplinterMD [facility] pgr [phone]   04/06/2017, 11:14 AM   Recent Labs Lab 04/04/17 0417  04/06/17 0443 04/07/17 0427 04/07/17 1415 04/08/17 0333  NA 137  < > 137 137 135 137  K 4.1  < > 4.7 4.8 4.2 4.5  CL 97*  < > 95* 94* 96* 97*  CO2 26  < > _0 GLUCOSE 95  < > 109* 120* 113* 99  BUN 22*  < > 32* 53* 18 29*  CREATININE 3.86*  < > 4.48* 5.63* 2.60* 3.61*  CALCIUM 8.8*  < > 8.7* 8.7* 8.6* 8.7*  PHOS 5.5*  --  5.6* 6.0*  --   --   < > = values in this interval not displayed.  Recent Labs Lab 04/05/17 0601  04/07/17 0427 04/07/17 1415 04/08/17 0333  AST 19  --   --  28 19  ALT 13*  --   --  8* 12*  ALKPHOS 234*  --   --  194* 226*  BILITOT 1.2  --   --  1.0 1.1  PROT 7.0  --   --  6.5 6.9  ALBUMIN 2.4*  < > 2.1* 2.2* 2.2*  < > = values in this interval not displayed.  Recent Labs Lab 04/04/17 0417 04/05/17 0601 04/06/17 0443 04/07/17 0427 04/08/17 0333  WBC 12.5* 16.9* 19.7* 16.4* 13.4*  HGB 8.5* 8.4* 7.8* 7.3* 7.9*  HCT 29.8* 29.5* 27.6* 25.1* 28.2*  MCV  100.3* 99.3 99.3 97.7 98.9  PLT 489* 459* 457* 423* 472*     Recent Labs Lab 04/07/17 1959 04/07/17 2346 04/08/17 0400 04/08/17 0816 04/08/17 1136  GLUCAP 171* 115* 100* 87 205*    Medications: Infusions: . albumin human    . fluconazole (DIFLUCAN) IV 100 mg (04/08/17 1119)  . piperacillin-tazobactam (ZOSYN)  IV Stopped (04/08/17 0440)    Scheduled Medications: . calcium carbonate (dosed in mg elemental calcium)  250 mg of elemental calcium Oral TID WC  . chlorhexidine gluconate (MEDLINE KIT)  15 mL Mouth Rinse BID  . darbepoetin (ARANESP) injection - DIALYSIS  200 mcg Intravenous Q Thu-HD  . dextrose  1 ampule Intravenous Once  . docusate sodium  100 mg Oral BID  . feeding supplement (PRO-STAT SUGAR FREE 64)  30 mL Oral TID  . [name]'s butt cream   Topical QID  . heparin  5,000 Units Subcutaneous Q8H  . insulin aspart  0-15 Units Subcutaneous TID WC  . insulin glargine  8 Units Subcutaneous Daily  . mouth rinse  15 mL Mouth Rinse QID  . midodrine  10 mg Oral Q T,Th,Sa-HD  . multivitamin  1 tablet Oral QHS  . nystatin  5 mL Oral QID  . saccharomyces boulardii  250 mg Oral BID

## 2017-04-08 NOTE — Progress Notes (Signed)
Capped patient's trach.  Tolerated well.  HR 117, sat 100%, RR 20.  Will continue to monitor.

## 2017-04-08 NOTE — Progress Notes (Addendum)
  Speech Language Pathology Treatment: Dysphagia;Cognitive-Linquistic  Patient Details Name: Asli Bustamante MRN: [id number] DOB: 05/22/1981 Today's Date: 04/08/2017 Time: 6754-4920 SLP Time Calculation (min) (ACUTE ONLY): 27 min  Assessment / Plan / Recommendation Clinical Impression  Initiated reassessment of current cognitive function from initial assessment when pt intubated (trach) 6/13. On the Cognistat she scored in the severe impairment range for orientation (2/12), she knew to utilize calendar when prompted. Scored 6/8 on attention subtest which is within normal range and 12/12 on sentence/phrase repetition within normal range. Next session will focus on comprehension, naming, memory, calculations, reasoning and judgement.   PMV donned for session and tolerated well. Mod-max reminders for increased volume. After set up assist pt able to self feed needing mod-max cues for smaller bites and sips. No signs aspiration during 5 min observation and is tolerating Dys 2 texture (upgrade when demonstrating increased use of swallow precautions with decreased level of assist.    HPI HPI: Kalen Shelby a 36 y.o.femalewith a history of ESRD on HD T TH S, COPD/ Asthma , D s/p MI 04/2015, HLD, HTN, CHF, chronic anemia, DM, and a history of L foot partial amputation 09/2016 with revision in 12/2016 for dehiscence, presenting to the ED with worsening LLE pain, swelling, increased drainage at the stump, and chills. ETT 5/19 and trach'd 5/31. Hospital course included cardiac arrest, transfer to ICU      SLP Plan  Continue with current plan of care       Recommendations  Diet recommendations: Dysphagia 2 (fine chop);Nectar-thick liquid Liquids provided via: Cup Medication Administration: Crushed with puree Supervision: Patient able to self feed;Full supervision/cueing for compensatory strategies Compensations: Small sips/bites;Slow rate;Minimize environmental distractions Postural Changes and/or Swallow  Maneuvers: Seated upright 90 degrees      Patient may use Passy-Muir Speech Valve: During all waking hours (remove during sleep);During PO intake/meals;Caregiver trained to provide supervision;During all therapies with supervision PMSV Supervision: Full         Oral Care Recommendations: Oral care BID Follow up Recommendations: Skilled Nursing facility SLP Visit Diagnosis: Dysphagia, unspecified (R13.10);Cognitive communication deficit (F00.712) Plan: Continue with current plan of care       GO                [name] 04/08/2017, 10:13 AM  [name] [name].Ed [employer] [phone]

## 2017-04-09 DIAGNOSIS — L89610 Pressure ulcer of right heel, unstageable: Secondary | ICD-10-CM

## 2017-04-09 LAB — GLUCOSE, CAPILLARY
GLUCOSE-CAPILLARY: 132 mg/dL — AB (ref 65–99)
Glucose-Capillary: 112 mg/dL — ABNORMAL HIGH (ref 65–99)
Glucose-Capillary: 125 mg/dL — ABNORMAL HIGH (ref 65–99)
Glucose-Capillary: 203 mg/dL — ABNORMAL HIGH (ref 65–99)

## 2017-04-09 LAB — CBC
HEMATOCRIT: 28.3 % — AB (ref 36.0–46.0)
Hemoglobin: 8.1 g/dL — ABNORMAL LOW (ref 12.0–15.0)
MCH: 28 pg (ref 26.0–34.0)
MCHC: 28.6 g/dL — ABNORMAL LOW (ref 30.0–36.0)
MCV: 97.9 fL (ref 78.0–100.0)
Platelets: 469 10*3/uL — ABNORMAL HIGH (ref 150–400)
RBC: 2.89 MIL/uL — AB (ref 3.87–5.11)
RDW: 17.6 % — AB (ref 11.5–15.5)
WBC: 11.7 10*3/uL — AB (ref 4.0–10.5)

## 2017-04-09 LAB — BASIC METABOLIC PANEL
Anion gap: 14 (ref 5–15)
BUN: 48 mg/dL — AB (ref 6–20)
CHLORIDE: 97 mmol/L — AB (ref 101–111)
CO2: 27 mmol/L (ref 22–32)
Calcium: 9 mg/dL (ref 8.9–10.3)
Creatinine, Ser: 5.16 mg/dL — ABNORMAL HIGH (ref 0.44–1.00)
GFR calc Af Amer: 11 mL/min — ABNORMAL LOW (ref >60)
GFR calc non Af Amer: 10 mL/min — ABNORMAL LOW (ref >60)
Glucose, Bld: 110 mg/dL — ABNORMAL HIGH (ref 65–99)
POTASSIUM: 4.6 mmol/L (ref 3.5–5.1)
SODIUM: 138 mmol/L (ref 135–145)

## 2017-04-09 MED ORDER — LIDOCAINE HCL (PF) 1 % IJ SOLN: 5.0000 mL | INTRAMUSCULAR | Status: DC | PRN

## 2017-04-09 MED ORDER — ALTEPLASE 2 MG IJ SOLR: 2.0000 mg | Freq: Once | INTRAMUSCULAR | Status: DC | PRN

## 2017-04-09 MED ORDER — DARBEPOETIN ALFA 200 MCG/0.4ML IJ SOSY
PREFILLED_SYRINGE | INTRAMUSCULAR | Status: AC
  Administered 2017-04-09: 200 ug via INTRAVENOUS
  Filled 2017-04-09: qty 0.4

## 2017-04-09 MED ORDER — ACETAMINOPHEN 325 MG PO TABS
ORAL_TABLET | ORAL | Status: AC
  Administered 2017-04-09: 650 mg via ORAL
  Filled 2017-04-09: qty 2

## 2017-04-09 MED ORDER — SODIUM CHLORIDE 0.9 % IV SOLN: 100.0000 mL | INTRAVENOUS | Status: DC | PRN

## 2017-04-09 MED ORDER — PENTAFLUOROPROP-TETRAFLUOROETH EX AERO: 1.0000 "application " | INHALATION_SPRAY | CUTANEOUS | Status: DC | PRN

## 2017-04-09 MED ORDER — HEPARIN SODIUM (PORCINE) 1000 UNIT/ML DIALYSIS: 1000.0000 [IU] | INTRAMUSCULAR | Status: DC | PRN

## 2017-04-09 MED ORDER — LIDOCAINE-PRILOCAINE 2.5-2.5 % EX CREA: 1.0000 "application " | TOPICAL_CREAM | CUTANEOUS | Status: DC | PRN

## 2017-04-09 NOTE — Progress Notes (Signed)
Patient ID: [name], female   DOB: 05-23-1981, 36 y.o.   MRN: [id number]  This NP visited patient at the bedside for follow for palliative needs.  Patient and her mother are sleeping peacefully in room, did not disturb.    Placed calll to sister/Tymeka # [phone]  in [place] to offer update and  support  No questions and concerns today.  PMT will continue to support holistically   No charge  [name] NP  Palliative Medicine Team Team Phone # [phone] Pager [phone]

## 2017-04-09 NOTE — Progress Notes (Signed)
PULMONARY / CRITICAL CARE MEDICINE   Name: [name] MRN: [id number] DOB: [birth year]    ADMISSION DATE:  02/14/2017 CONSULTATION DATE:  02/14/2017  REFERRING MD:  Feliz Beam MD  CHIEF COMPLAINT:  Sepsis, cardiac arrest  BRIEF SUMMARY:   36 year old female w/ ESRD d/t poorly controlled DM, bilateral transmetatarsal foot amputations, obesity, HTN, medical non-compliance as well as NICM. Admitted 5/19 with complaints of left leg pain, found to have radiological evidence of osteomyelitis with wound nonhealing, dehiscence, purulent drainage from the wound. Admitted for IV antibiotics, hemodialysis and possible surgery.  Found unresponsive in the room on 5/19. She had chest compressions, epi bicarb given. She was intubated by anesthesia and had ROSC in approximately 9 minutes. Downtime prior to code is unknown. She got dilaudid in the afternoon for leg pain.  SUBJECTIVE: trach capped 7/11.  Tolerating well.  Eating breakfast    VITAL SIGNS: BP 127/87 (BP Location: Left Arm)   Pulse (!) 126   Temp 98.1 F (36.7 C) (Oral)   Resp 20   Ht 5' 8.5" (1.74 m)   Wt 87 kg (191 lb 12.8 oz)   LMP  (LMP Unknown) Comment: Patient trached  SpO2 96%   BMI 28.74 kg/m   VENTILATOR SETTINGS: FiO2 (%):  [28 %] 28 %  INTAKE / OUTPUT: I/O last 3 completed shifts: In: 300 [P.O.:100; IV Piggyback:200] Out: -   General:  Chronically ill appearing female, NAD eating breakfast  HEENT: MM pink/moist, trach site c/d/i, cap in place Neuro: awake, alert, eating, nods appropriately, generalized weakness  CV: s1s2 rrr, no m/r/g, tachy  PULM: resps even non labored on RA with trach capped, diminished bases, few scattered rhonchi  XH:FSFS, non-tender, bsx4 active  Extremities: warm/dry, 1+ BLE edema, partial foot amputations Skin: CDI, No rash or lesions noted  LABS:  BMET  Recent Labs Lab 04/07/17 1415 04/08/17 0333 04/09/17 0429  NA 135 137 138  K 4.2 4.5 4.6  CL 96* 97* 97*  CO2 28 27 27   BUN  18 29* 48*  CREATININE 2.60* 3.61* 5.16*  GLUCOSE 113* 99 110*   Electrolytes  Recent Labs Lab 04/04/17 0417  04/06/17 0443 04/07/17 0427 04/07/17 1415 04/08/17 0333 04/09/17 0429  CALCIUM 8.8*  < > 8.7* 8.7* 8.6* 8.7* 9.0  PHOS 5.5*  --  5.6* 6.0*  --   --   --   < > = values in this interval not displayed. CBC  Recent Labs Lab 04/07/17 0427 04/08/17 0333 04/09/17 0429  WBC 16.4* 13.4* 11.7*  HGB 7.3* 7.9* 8.1*  HCT 25.1* 28.2* 28.3*  PLT 423* 472* 469*   No results for input(s): PHART, PCO2ART, PO2ART in the last 168 hours.  Liver Enzymes  Recent Labs Lab 04/05/17 0601  04/07/17 0427 04/07/17 1415 04/08/17 0333  AST 19  --   --  28 19  ALT 13*  --   --  8* 12*  ALKPHOS 234*  --   --  194* 226*  BILITOT 1.2  --   --  1.0 1.1  ALBUMIN 2.4*  < > 2.1* 2.2* 2.2*  < > = values in this interval not displayed. Glucose  Recent Labs Lab 04/08/17 0816 04/08/17 1136 04/08/17 1609 04/08/17 2013 04/09/17 0344 04/09/17 0741  GLUCAP 87 205* 148* 171* 112* 125*   STUDIES:  Lower extremity ultrasound 5/18 >> no evidence of DVT CT head 5/19 >> normal exam Echo 5/20 >> EF 30-35%, increased LVF. Diffuse hypokinesis, akinesis of basilar  mid-inferior myocardium, grade 2 diastolic dysfxn  EEG 5/20 >> finding c/w mod to severe global cerebral dysfxn. C/w anoxic injury given clinical course  LE [place] 5/21 >> negative MRI brain 5/24 >> ?thrombosed cortical vein, otherwise normal TEE 6/4 >> mild MR, normal AV, mild TR, mild PR, EF 30-35%, diffuse hypokinesis, no thrombus, no PFO, normal RV, moderate pericardial effusion with synechia suggesting some chronicity, no vegetations   CULTURES: Bcx 5/19 X 2 >> negative Blood cultures 6/1 >> negative c diff 6/1 >> negative  ANTIBIOTICS: Clinidamycin 5/19 >> 5/21 Vancomycin 5/19 >> 5/25 >> restart 5/30 Zosyn 5/19 >> 5/22 Meropenem 5/22 >> 5/30 Flagyl 5/30 >>off Ceftriaxone 5/30 >>off  SIGNIFICANT EVENTS: 5/19  Admit, cardiac  arrest, transfer to ICU; hypothermia protocol started 5/24  Off pressors.  MRI w/out evidence of ischemia  6/03  Tolerated ATC couple hours, then back to vent, HD   LINES/TUBES: ETT 5/19>>>5/31 Lt IJ CVL 5/19>>>out Trach 5/31>>>capped 7/11>>>  ASSESSMENT / PLAN:  Discussion:  36 y/o F with PMH of poorly controlled DM, ESRD on HD and known gangrene of L achilles heel, admitted with non-healing wounds.  Admitted per TRH.  Received dilaudid for pain and subsequently suffered a cardiac arrest. Now with anoxic brain injury - clinical status slowly improving.   Trach dependent respiratory failure post cardiac arrest - currently with #4 uncuffed shiley. Tolerated capped trach and RA since 7/11.   P: Monitor capped trach today - consider decannulation if tolerates x24-48 hrs  Titrate oxygen to maintain saturations > 94% Trach care per unit  protocol PT/ Mobilize  as able Intermittent CXR Speech following:  Dysphagia 2 Diet as recommended  PMV while awake with direct observation    PCCM will continue to f/u once weekly, please call sooner if needed.    Dirk Dress, NP 04/09/2017  8:49 AM Pager: [phone] or [phone]

## 2017-04-09 NOTE — Progress Notes (Signed)
PROGRESS NOTE    [name]   HGD:[id number]  DOB: 1981/03/24  DOA: 02/14/2017 PCP: Vicenta Aly, FNP   Brief Narrative:  Patient is a 36 y.o.femalewith history of medical noncompliance, ESRD on HD, poorly controlled diabetes, PVD, bilateral transmetatarsal amputation with subsequent left foot wound dehiscence (refused BKA), chronic systolic heart failure due to nonischemic cardiomyopathy.   Admitted on 02/14/17 to [facility] with left foot/leg pain, wound dehiscence and purulent drainage from the left trans-metatarsal amputation site.   She was subsequently found to be unresponsive on 5/19. Noted to bein asystole and CPR started with ROSC in approximately 9 minutes. She was intubated by anaesthesia, transferred to the intensive care unit and subsequently underwent cooling protocol. She was unable to be liberated off the ventilator and as a result underwent tracheostomy on 5/31.  Neurological/ Cognitive status has been poor. She has been evaluated by neurology on 5/29 and, per neurology, chances of meaningful neurological recovery appeared to be dismal.  Hospital course complicated by persistent leukocytosis, respiratory failure and left transmetatarsal wound dehiscence with gangrene. 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  She has subsequently had recurrent aspiration pneumonias.  CXR on 6/29 showed a RLL infiltrate.  Subjective: Evaluated with tech and mother in room. She is restless this AM. Pulse ox has come off. Lurline Idol is capped. Non-verbal at this time.  Assessment & Plan: Acute /chronichypoxemic vent dependent respiratory failure in a setting of cardiac arrest Aspiration pneumonia- RLL  Tracheostomy  - Intubated 6/19-6/20.  5/31- tracheostomy.  - PCCM following for resp issues  - S/P MBS 6/23. Now status post FEES 6/27. Patient started on nectar thick liquids and dysphagia 1 diet 6/27. Holding off on PEG   - has had aspiration issues- recurrent aspiration on 6/28-  Repeat  chest x-ray on 6/29 showed new right-sided infiltrate - She was started on Fortaz on 6/29 and changed over to [place] on 6/30   - Chest x-ray from 7/1 showed improving opacity in the right lung. - using passy-muir valve while awake - WBC normalizing   ESRD On HD TTS  per Nephrology    Left trans-metatarsal stump wound dehiscence with infection and gangrene Per operative note-pus/abscess noted extending up to the knee when BKA was attempted, subsequently underwent a AKA. Note, initially family was very reluctant to proceed with amputation, but given persistent leukocytosis/fever and potential risk of sepsis, family agreed for amputation - underwent AKA 6/15 -Orthopedics removed wound VAC 6/20- revision of her left AKA  04/03/17   - currently with wound vac- surgery plans to leave in for 1 week after surgery  Right heel eschar - wound care RN following - Prevalon boot  Ulcers on sacral area -[name] butt cream - air mattress  - have asked wound care to evaluate these   Diabetes mellitus - cont Lantus 8 U and SSI with meals  Thrush - started Nystatin and Diflucan 7/11- continue   Anoxic brain injury secondary to Cardiac arrest on 5/19: - slow cognitive improvement- following commands, talking intermittently but remains quite restless at times - per RN she is feeding herself now  Diarrhea  - resolved    Normocytic anemia Likely secondary to chronic disease-probably worsened by acute illness. No signs of bleeding, hemoglobin has been stable with some fluctuations.   Diabetes mellitus type 2  Continue with SSI. On Lantus. HbA1c 5.9.   Chronic systolic heart failure/nonischemic cardiomyopathy - Echocardiogram>> EF 25-30% with diffuse hypokinesis - volume management with dialysis - unable to tolerate Coreg  due to hypotension    Hypotension - Midodrine with dialysis   Pericardial effusion Seen on TEE-with no evidence of tamponade pathophysiology. Repeat echo 6/26  with no significant pericardial effusion.   Moderate protein calorie malnutrition Started orals 6/27. Aspiration precautions  Depression?  - has been on Cymbalta at home which is currently being held    DVT prophylaxis: Heparin Code Status: Full code Family Communication: mother  Disposition Plan: to be determined Consultants:  Nephrology, pulmonary, palliative care, ID, Critical care, Ortho Procedures:  Transthoracic echocardiogram 6/28 Study Conclusions  - Left ventricle: The cavity size was normal. There was mild concentric hypertrophy. Systolic function was severely reduced. The estimated ejection fraction was in the range of 25% to 30%. Diffuse hypokinesis. Regional wall motion abnormalities cannot be excluded. - Mitral valve: There was mild regurgitation. - Pericardium, extracardiac: A trivial pericardial effusion was identified circumferential to the heart.  Left above-knee amputation. 6/15  Antimicrobials:  Anti-infectives    Start     Dose/Rate Route Frequency Ordered Stop   04/08/17 1000  fluconazole (DIFLUCAN) IVPB 100 mg     100 mg 50 mL/hr over 60 Minutes Intravenous Every 24 hours 04/08/17 0851     04/04/17 0600  ceFAZolin (ANCEF) IVPB 2g/100 mL premix  Status:  Discontinued     2 g 200 mL/hr over 30 Minutes Intravenous On call to O.R. 04/03/17 1840 04/03/17 1848   03/28/17 1600  cefTAZidime (FORTAZ) 1 g in dextrose 5 % 50 mL IVPB  Status:  Discontinued     1 g 100 mL/hr over 30 Minutes Intravenous Every 24 hours 03/27/17 0814 03/28/17 0752   03/28/17 1100  piperacillin-tazobactam (ZOSYN) IVPB 3.375 g     3.375 g 12.5 mL/hr over 240 Minutes Intravenous Every 12 hours 03/28/17 1038 04/11/17 1359   03/27/17 0815  cefTAZidime (FORTAZ) 1 g in dextrose 5 % 50 mL IVPB     1 g 100 mL/hr over 30 Minutes Intravenous  Once 03/27/17 0814 03/27/17 1026   03/17/17 1800  cefTAZidime (FORTAZ) 2 g in dextrose 5 % 50 mL IVPB  Status:  Discontinued     2  g 100 mL/hr over 30 Minutes Intravenous Every T-Th-Sa (1800) 03/15/17 1143 03/18/17 1223   03/17/17 1200  vancomycin (VANCOCIN) IVPB 1000 mg/200 mL premix  Status:  Discontinued     1,000 mg 200 mL/hr over 60 Minutes Intravenous Every T-Th-Sa (Hemodialysis) 03/15/17 1143 03/18/17 1223   03/14/17 1200  vancomycin (VANCOCIN) IVPB 1000 mg/200 mL premix  Status:  Discontinued     1,000 mg 200 mL/hr over 60 Minutes Intravenous Every T-Th-Sa (Hemodialysis) 03/12/17 0912 03/14/17 1542   03/13/17 1430  ceFAZolin (ANCEF) IVPB 2g/100 mL premix  Status:  Discontinued     2 g 200 mL/hr over 30 Minutes Intravenous To ShortStay Surgical 03/12/17 1208 03/13/17 1113   03/13/17 1115  ceFAZolin (ANCEF) IVPB 2g/100 mL premix     2 g 200 mL/hr over 30 Minutes Intravenous To Surgery 03/13/17 1108 03/13/17 1506   03/12/17 1800  cefTAZidime (FORTAZ) 2 g in dextrose 5 % 50 mL IVPB  Status:  Discontinued     2 g 100 mL/hr over 30 Minutes Intravenous Every T-Th-Sa (1800) 03/12/17 0912 03/14/17 1542   03/12/17 1200  vancomycin (VANCOCIN) 1,500 mg in sodium chloride 0.9 % 250 mL IVPB     1,500 mg 250 mL/hr over 60 Minutes Intravenous Every Thu (Hemodialysis) 03/12/17 0912 03/12/17 1356   03/02/17 1200  cefTRIAXone (ROCEPHIN) 2 g in  dextrose 5 % 50 mL IVPB     2 g 100 mL/hr over 30 Minutes Intravenous Every 24 hours 03/02/17 0810 03/06/17 1358   03/02/17 1200  vancomycin (VANCOCIN) IVPB 1000 mg/200 mL premix     1,000 mg 200 mL/hr over 60 Minutes Intravenous Every M-W-F (Hemodialysis) 03/02/17 0815 03/02/17 1457   03/02/17 1200  metroNIDAZOLE (FLAGYL) IVPB 500 mg     500 mg 100 mL/hr over 60 Minutes Intravenous Every 8 hours 03/02/17 0948 03/06/17 2230   02/26/17 1200  vancomycin (VANCOCIN) IVPB 1000 mg/200 mL premix     1,000 mg 200 mL/hr over 60 Minutes Intravenous Every T-Th-Sa (Hemodialysis) 02/25/17 1151 03/05/17 1325   02/25/17 1200  vancomycin (VANCOCIN) 2,000 mg in sodium chloride 0.9 % 500 mL IVPB      2,000 mg 250 mL/hr over 120 Minutes Intravenous  Once 02/25/17 1149 02/25/17 1449   02/25/17 0900  cefTRIAXone (ROCEPHIN) 2 g in dextrose 5 % 50 mL IVPB  Status:  Discontinued     2 g 100 mL/hr over 30 Minutes Intravenous Every 24 hours 02/25/17 0814 03/02/17 0810   02/25/17 0900  metroNIDAZOLE (FLAGYL) IVPB 500 mg  Status:  Discontinued     500 mg 100 mL/hr over 60 Minutes Intravenous Every 8 hours 02/25/17 0814 03/02/17 0948   02/20/17 1200  vancomycin (VANCOCIN) IVPB 1000 mg/200 mL premix  Status:  Discontinued     1,000 mg 200 mL/hr over 60 Minutes Intravenous Every M-W-F (Hemodialysis) 02/19/17 1506 02/20/17 1403   02/17/17 1800  meropenem (MERREM) 500 mg in sodium chloride 0.9 % 50 mL IVPB  Status:  Discontinued     500 mg 100 mL/hr over 30 Minutes Intravenous Daily-1800 02/17/17 1026 02/25/17 0814   02/17/17 1200  vancomycin (VANCOCIN) IVPB 1000 mg/200 mL premix     1,000 mg 200 mL/hr over 60 Minutes Intravenous Every T-Th-Sa (Hemodialysis) 02/14/17 1758 02/19/17 1612   02/14/17 2200  clindamycin (CLEOCIN) IVPB 900 mg  Status:  Discontinued     900 mg 100 mL/hr over 30 Minutes Intravenous Every 8 hours 02/14/17 1106 02/14/17 1928   02/14/17 2000  clindamycin (CLEOCIN) IVPB 900 mg  Status:  Discontinued     900 mg 100 mL/hr over 30 Minutes Intravenous Every 8 hours 02/14/17 1928 02/16/17 1047   02/14/17 1915  piperacillin-tazobactam (ZOSYN) IVPB 3.375 g  Status:  Discontinued     3.375 g 100 mL/hr over 30 Minutes Intravenous Every 12 hours 02/14/17 1801 02/17/17 1026   02/14/17 1845  vancomycin (VANCOCIN) 2,000 mg in sodium chloride 0.9 % 500 mL IVPB     2,000 mg 250 mL/hr over 120 Minutes Intravenous  Once 02/14/17 1758 02/14/17 2107   02/14/17 1115  clindamycin (CLEOCIN) IVPB 900 mg  Status:  Discontinued     900 mg 100 mL/hr over 30 Minutes Intravenous  Once 02/14/17 1106 02/14/17 2236   02/14/17 0945  clindamycin (CLEOCIN) IVPB 600 mg     600 mg 100 mL/hr over 30 Minutes  Intravenous  Once 02/14/17 0931 02/14/17 1013       Objective: Vitals:   04/09/17 0345 04/09/17 0400 04/09/17 0742 04/09/17 0808  BP:  (!) 132/93 127/87   Pulse:    (!) 126  Resp:  17  20  Temp: 98 F (36.7 C)  98.1 F (36.7 C)   TempSrc: Oral  Oral   SpO2:      Weight: 87 kg (191 lb 12.8 oz)     Height:  Intake/Output Summary (Last 24 hours) at 04/09/17 0923 Last data filed at 04/09/17 0504  Gross per 24 hour  Intake              150 ml  Output                0 ml  Net              150 ml   Filed Weights   04/07/17 0745 04/08/17 0500 04/09/17 0345  Weight: 78 kg (171 lb 15.3 oz) 85 kg (187 lb 6.3 oz) 87 kg (191 lb 12.8 oz)    Examination: General exam: Appears comfortable  HEENT: PERRLA, oral mucosa moist, has severe thrush, no sclera icterus or thrush- trach intact Respiratory system: Clear to auscultation. Respiratory effort normal. Cardiovascular system: S1 & S2 heard, RRR.  No murmurs  Gastrointestinal system: Abdomen soft, non-tender, nondistended. Normal bowel sound. No organomegaly Central nervous system: Alert - moving arms and legs - normal tone in extermities Extremities: No cyanosis, clubbing or edema- left AKA with wound vac Skin: multiple ulcers on sacral area- no cellulitis- right heel eschar Psychiatry:  alert    Data Reviewed: I have personally reviewed following labs and imaging studies  CBC:  Recent Labs Lab 04/05/17 0601 04/06/17 0443 04/07/17 0427 04/08/17 0333 04/09/17 0429  WBC 16.9* 19.7* 16.4* 13.4* 11.7*  HGB 8.4* 7.8* 7.3* 7.9* 8.1*  HCT 29.5* 27.6* 25.1* 28.2* 28.3*  MCV 99.3 99.3 97.7 98.9 97.9  PLT 459* 457* 423* 472* 160*   Basic Metabolic Panel:  Recent Labs Lab 04/04/17 0417  04/06/17 0443 04/07/17 0427 04/07/17 1415 04/08/17 0333 04/09/17 0429  NA 137  < > 137 137 135 137 138  K 4.1  < > 4.7 4.8 4.2 4.5 4.6  CL 97*  < > 95* 94* 96* 97* 97*  CO2 26  < > _0 GLUCOSE 95  < > 109* 120* 113* 99  110*  BUN 22*  < > 32* 53* 18 29* 48*  CREATININE 3.86*  < > 4.48* 5.63* 2.60* 3.61* 5.16*  CALCIUM 8.8*  < > 8.7* 8.7* 8.6* 8.7* 9.0  PHOS 5.5*  --  5.6* 6.0*  --   --   --   < > = values in this interval not displayed. GFR: Estimated Creatinine Clearance: 17.4 mL/min (A) (by C-G formula based on SCr of 5.16 mg/dL (H)). Liver Function Tests:  Recent Labs Lab 04/05/17 0601 04/06/17 0443 04/07/17 0427 04/07/17 1415 04/08/17 0333  AST 19  --   --  28 19  ALT 13*  --   --  8* 12*  ALKPHOS 234*  --   --  194* 226*  BILITOT 1.2  --   --  1.0 1.1  PROT 7.0  --   --  6.5 6.9  ALBUMIN 2.4* 2.3* 2.1* 2.2* 2.2*   No results for input(s): LIPASE, AMYLASE in the last 168 hours. No results for input(s): AMMONIA in the last 168 hours. Coagulation Profile: No results for input(s): INR, PROTIME in the last 168 hours. Cardiac Enzymes: No results for input(s): CKTOTAL, CKMB, CKMBINDEX, TROPONINI in the last 168 hours. BNP (last 3 results) No results for input(s): PROBNP in the last 8760 hours. HbA1C: No results for input(s): HGBA1C in the last 72 hours. CBG:  Recent Labs Lab 04/08/17 1136 04/08/17 1609 04/08/17 2013 04/09/17 0344 04/09/17 0741  GLUCAP 205* 148* 171* 112* 125*   Lipid Profile: No results  for input(s): CHOL, HDL, LDLCALC, TRIG, CHOLHDL, LDLDIRECT in the last 72 hours. Thyroid Function Tests: No results for input(s): TSH, T4TOTAL, FREET4, T3FREE, THYROIDAB in the last 72 hours. Anemia Panel: No results for input(s): VITAMINB12, FOLATE, FERRITIN, TIBC, IRON, RETICCTPCT in the last 72 hours. Urine analysis:    Component Value Date/Time   COLORURINE AMBER (A) 02/14/2017 2111   APPEARANCEUR HAZY (A) 02/14/2017 2111   LABSPEC 1.015 02/14/2017 2111   PHURINE 5.0 02/14/2017 2111   GLUCOSEU NEGATIVE 02/14/2017 2111   HGBUR SMALL (A) 02/14/2017 2111   BILIRUBINUR NEGATIVE 02/14/2017 2111   [place] 02/14/2017 2111   PROTEINUR 30 (A) 02/14/2017 2111    UROBILINOGEN 0.2 09/21/2014 1908   NITRITE NEGATIVE 02/14/2017 2111   LEUKOCYTESUR TRACE (A) 02/14/2017 2111   Sepsis Labs: _0 (procalcitonin:4,lacticidven:4) )No results found for this or any previous visit (from the past 240 hour(s)).       Radiology Studies: No results found.    Scheduled Meds: . calcium carbonate (dosed in mg elemental calcium)  250 mg of elemental calcium Oral TID WC  . chlorhexidine gluconate (MEDLINE KIT)  15 mL Mouth Rinse BID  . darbepoetin (ARANESP) injection - DIALYSIS  200 mcg Intravenous Q Thu-HD  . dextrose  1 ampule Intravenous Once  . docusate sodium  100 mg Oral BID  . feeding supplement (PRO-STAT SUGAR FREE 64)  30 mL Oral TID  . [name]'s butt cream   Topical QID  . heparin  5,000 Units Subcutaneous Q8H  . insulin aspart  0-15 Units Subcutaneous TID WC  . insulin glargine  8 Units Subcutaneous Daily  . mouth rinse  15 mL Mouth Rinse QID  . midodrine  10 mg Oral Q T,Th,Sa-HD  . multivitamin  1 tablet Oral QHS  . nystatin  5 mL Oral QID  . saccharomyces boulardii  250 mg Oral BID   Continuous Infusions: . albumin human    . fluconazole (DIFLUCAN) IV Stopped (04/08/17 1219)  . piperacillin-tazobactam (ZOSYN)  IV Stopped (04/09/17 0504)     LOS: 54 days    Time spent in minutes: 35    Debbe Odea, MD [facility] Pager: [email or web address] Password [facility] 04/09/2017, 9:23 AM

## 2017-04-09 NOTE — Consult Note (Signed)
WOC Nurse wound consult note Reason for Consult: perirectal wound Wound type: Incontinence associated dermatitis Pressure Injury POA: NA Measurement: 8cm circular area around rectum Wound bed: pink Drainage (amount, consistency, odor) none Periwound: intact, moist Dressing procedure/placement/frequency:I have provided nurses with orders for cleansing and applying Purple Top Criticaid Barrier BID and prn soiling.  Pt has multiple other wounds that are already being treated with Dr. Lubertha [place] Cream. Pt is already on a low air loss mattress. If she moves out to the regular floor she will need a specialty bed. We will not follow, but will remain available to this patient, to nursing, and the medical and/or surgical teams.  Please re-consult if we need to assist further.    Barnett Hatter, RN-C, WTA-C Wound Treatment Associate

## 2017-04-09 NOTE — Progress Notes (Signed)
PT Cancellation Note  Patient Details Name: [name] MRN: [id number] DOB: Mar 12, 1981   Cancelled Treatment:    Reason Eval/Treat Not Completed: Patient at procedure or test/unavailable (pt at HD)   Angelina Ok [facility] 04/09/2017, 2:18 PM [facility] PT [phone]

## 2017-04-09 NOTE — Progress Notes (Signed)
[facility] Rounding Note  Subjective:  Sitting up, cheerful and joking around   Vital signs in last 24 hours: Vitals:   04/09/17 0400 04/09/17 0742 04/09/17 0808 04/09/17 1204  BP: (!) 132/93 127/87  (!) 128/91  Pulse:   (!) 126   Resp: 17  20   Temp:  98.1 F (36.7 C)  98.3 F (36.8 C)  TempSrc:  Oral  Oral  SpO2:    99%  Weight:      Height:       Weight change: 9 kg (19 lb 13.5 oz)  Intake/Output Summary (Last 24 hours) at 04/09/17 1318 Last data filed at 04/09/17 0504  Gross per 24 hour  Intake              100 ml  Output                0 ml  Net              100 ml   Physical Exam: Alert, smiling, talking Trach/trach collar Anteriorly coarse BS but o/w clear S1S2 No S3 HR 120 Abd obese, soft and non tender 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 Admitted 5/19 w/left leg pain, + radiological evidence of osteomyelitis w/wound nonhealing, dehiscence, purulent drainage. Admitted for IV antibiotics, hemodialysis and possible surgery. Found unresponsive in her room that afternoon - cardiac arrest > rec'd  CPR, epi bicarb; intubated; cooled. Now with anoxic brain injury, trach and s/p L AKA this hosp. Course further complicated by recurrent aspiration PNA, dehiscence of AK wound requiring revision 7/6.   Assessment: 1. Anoxic brain injury sp arrest - pt is awake, speaking and eating, brain injury mostly resolved. 2. Trach - on trach collar. Not sure what the plan is for decannulation. Not on vent since 6/20.  3. Fever/ PNA /^WBC -  completing IV abx today 7/12 4. Tachycardia - not sure cause, don't attempt to treat w MTP, doesn't tolerate 5. ESRD -  TTS HD, started HD June '17. HD hampered by hypotension. Added midodrine preHD 6. Anemia- on max aranesp- hgb stable in 8's  7. MBD of CKD - Ca and Phos in range, on calc suspension. PTH 88 here.   8. 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   9. DM2 - per primary 10. sHF - EF 25-30% contributes to BP issues on HD 11. Nutrition- unclear if will be able to take enough in to meet needs, albumin 3.0 12. Dispo- difficult with ESRD and trach- [place] Medicaid so not sure LTAC is option.    Plan - HD on TTS schedule , upstairs   RKelly SplinterMD [facility] Hospitalp[facility] [phone]  04/06/2017, 11:14 AM   Recent Labs Lab 04/04/17 0417  04/06/17 0443 04/07/17 0427 04/07/17 1415 04/08/17 0333 04/09/17 0429  NA 137  < > 137 137 135 137 138  K 4.1  < > 4.7  4.8 4.2 4.5 4.6  CL 97*  < > 95* 94* 96* 97* 97*  CO2 26  < > 26 27 28 27 27   GLUCOSE 95  < > 109* 120* 113* 99 110*  BUN 22*  < > 32* 53* 18 29* 48*  CREATININE 3.86*  < > 4.48* 5.63* 2.60* 3.61* 5.16*  CALCIUM 8.8*  < > 8.7* 8.7* 8.6* 8.7* 9.0  PHOS 5.5*  --  5.6* 6.0*  --   --   --   < > = values in this interval not displayed.  Recent Labs Lab 04/05/17 0601  04/07/17 0427 04/07/17 1415 04/08/17 0333  AST 19  --   --  28 19  ALT 13*  --   --  8* 12*  ALKPHOS 234*  --   --  194* 226*  BILITOT 1.2  --   --  1.0 1.1  PROT 7.0  --   --  6.5 6.9  ALBUMIN 2.4*  < > 2.1* 2.2* 2.2*  < > = values in this interval not displayed.  Recent Labs Lab 04/05/17 0601 04/06/17 0443 04/07/17 0427 04/08/17 0333 04/09/17 0429  WBC 16.9* 19.7* 16.4* 13.4* 11.7*  HGB 8.4* 7.8* 7.3* 7.9* 8.1*  HCT 29.5* 27.6* 25.1* 28.2* 28.3*  MCV 99.3 99.3 97.7 98.9 97.9  PLT 459* 457* 423* 472* 469*     Recent Labs Lab 04/08/17 1609 04/08/17 2013 04/09/17 0344 04/09/17 0741 04/09/17 1203  GLUCAP 148* 171*  112* 125* 203*    Medications: Infusions: . albumin human    . fluconazole (DIFLUCAN) IV Stopped (04/09/17 1048)  . piperacillin-tazobactam (ZOSYN)  IV Stopped (04/09/17 0504)    Scheduled Medications: . calcium carbonate (dosed in mg elemental calcium)  250 mg of elemental calcium Oral TID WC  . chlorhexidine gluconate (MEDLINE KIT)  15 mL Mouth Rinse BID  . darbepoetin (ARANESP) injection - DIALYSIS  200 mcg Intravenous Q Thu-HD  . dextrose  1 ampule Intravenous Once  . docusate sodium  100 mg Oral BID  . feeding supplement (PRO-STAT SUGAR FREE 64)  30 mL Oral TID  . [name]'s butt cream   Topical QID  . heparin  5,000 Units Subcutaneous Q8H  . insulin aspart  0-15 Units Subcutaneous TID WC  . insulin glargine  8 Units Subcutaneous Daily  . mouth rinse  15 mL Mouth Rinse QID  . midodrine  10 mg Oral Q T,Th,Sa-HD  . multivitamin  1 tablet Oral QHS  . nystatin  5 mL Oral QID  . saccharomyces boulardii  250 mg Oral BID

## 2017-04-09 NOTE — Discharge Instructions (Signed)

## 2017-04-10 DIAGNOSIS — Z978 Presence of other specified devices: Secondary | ICD-10-CM

## 2017-04-10 LAB — GLUCOSE, CAPILLARY
GLUCOSE-CAPILLARY: 93 mg/dL (ref 65–99)
Glucose-Capillary: 163 mg/dL — ABNORMAL HIGH (ref 65–99)
Glucose-Capillary: 140 mg/dL — ABNORMAL HIGH (ref 65–99)
Glucose-Capillary: 165 mg/dL — ABNORMAL HIGH (ref 65–99)

## 2017-04-10 MED ORDER — DULOXETINE HCL 30 MG PO CPEP
30.0000 mg | ORAL_CAPSULE | Freq: Every day | ORAL | Status: DC
  Administered 2017-04-10 – 2017-04-30 (×19): 30 mg via ORAL
  Filled 2017-04-10 (×20): qty 1

## 2017-04-10 MED ORDER — CARVEDILOL 3.125 MG PO TABS
3.1250 mg | ORAL_TABLET | Freq: Two times a day (BID) | ORAL | Status: DC
  Administered 2017-04-10 – 2017-04-12 (×2): 3.125 mg via ORAL
  Filled 2017-04-10 (×2): qty 1

## 2017-04-10 NOTE — Progress Notes (Signed)
  Speech Language Pathology Treatment: Cognitive-Linquistic  Patient Details Name: [name] MRN: [id number] DOB: 1981-02-06 Today's Date: 04/10/2017 Time: 2035-5974 SLP Time Calculation (min) (ACUTE ONLY): 22 min  Assessment / Plan / Recommendation Clinical Impression  Continued diagnostic treatment of cognitive abilities with trach plugged. Good immediate recall x 2 of 4 words; unable to recall words independently after 6 min delay; recalled 1 of 4 with choice, 3/4 incorrect given choice of three. She needed total verbal and visual assist with simple mental math (15 + 7). Named objects around room accurately with min prompt x  1. She has made significant communicative and cognitive improvements in her ability to respond, response time and interaction with others. Made her needs known for bathroom with this SLP. Will revise/update goals and continue to treat.     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 ETT 5/19 and trach'd 5/31. Hospital course included cardiac arrest, transfer to ICU      SLP Plan  Continue with current plan of care       Recommendations  Diet recommendations: Dysphagia 2 (fine chop);Nectar-thick liquid Liquids provided via: Cup Medication Administration: Crushed with puree Supervision: Patient able to self feed;Full supervision/cueing for compensatory strategies Compensations: Small sips/bites;Slow rate;Minimize environmental distractions Postural Changes and/or Swallow Maneuvers: Seated upright 90 degrees                Oral Care Recommendations: Oral care BID Follow up Recommendations:  (TBD) SLP Visit Diagnosis: Dysphagia, unspecified (R13.10);Cognitive communication  deficit (B63.845) Plan: Continue with current plan of care       GO                [name] 04/10/2017, 1:23 PM  [name] [name].Ed [employer] [phone]

## 2017-04-10 NOTE — Progress Notes (Addendum)
PROGRESS NOTE    [name]   DUK:[id number]  DOB: Jan 30, 1981  DOA: 02/14/2017 PCP: Vicenta Aly, FNP   Brief Narrative:  Patient is a 36 y.o.femalewith history of medical noncompliance, ESRD on HD, poorly controlled diabetes, PVD, bilateral transmetatarsal amputation with subsequent left foot wound dehiscence (refused BKA), chronic systolic heart failure due to nonischemic cardiomyopathy.   Admitted on 02/14/17 to [facility] with left foot/leg pain, wound dehiscence and purulent drainage from the left trans-metatarsal amputation site.   She was subsequently found to be unresponsive on 5/19. Noted to bein asystole and CPR started with ROSC in approximately 9 minutes. She was intubated by anaesthesia, transferred to the intensive care unit and subsequently underwent cooling protocol. She was unable to be liberated off the ventilator and as a result underwent tracheostomy on 5/31.  Neurological/ Cognitive status has been poor. She has been evaluated by neurology on 5/29 and, per neurology, chances of meaningful neurological recovery appeared to be dismal.  Hospital course complicated by persistent leukocytosis, respiratory failure and left transmetatarsal wound dehiscence with gangrene. 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  She has subsequently had recurrent aspiration pneumonias.  CXR on 6/29 showed a RLL infiltrate.  Subjective: Evaluated with IV nurse and mother in room. She is asleep on the chair and difficult to keep away. Talks briefly to me before falling asleep again. No complaints. Per mother, the patient has had no issues. Spoke with RN about the patient and no issues reported.  Assessment & Plan: Acute /chronichypoxemic vent dependent respiratory failure in a setting of cardiac arrest Aspiration pneumonia- RLL  Tracheostomy  - Intubated 6/19-6/20.  5/31- tracheostomy.  - PCCM following for resp issues  - S/P MBS 6/23. Now status post FEES 6/27. Patient started on nectar  thick liquids and dysphagia 1 diet 6/27. Holding off on PEG   - has had aspiration issues- recurrent aspiration on 6/28-  Repeat chest x-ray on 6/29 showed new right-sided infiltrate - She was started on Fortaz on 6/29 and changed over to [place] on 6/30   - Chest x-ray from 7/1 showed improving opacity in the right lung. - has completed 14 days of Zosyn  - using passy-muir valve while awake   ESRD On HD TTS  per Nephrology   Sinus tachycardia - likely due to severe deconditioning - on Midodrine during dialysis days which can help with this - will try to resume a low dose of Coreg on non-dialysis days   Left trans-metatarsal stump wound dehiscence with infection and gangrene - underwent AKA 6/15 - Per operative note, pus/abscess noted extending up to the knee when BKA was attempted and she subsequently underwent a AKA. Note, initially family was very reluctant to proceed with amputation, but given persistent leukocytosis/fever the family agreed for amputation - Orthopedics removed wound VAC 6/20 - revision of her left AKA  04/03/17   - currently with wound vac   Right heel eschar - wound care RN following - Prevalon boot  Ulcers on sacral area- incontinence associated dermatitis - WOC >> Cleanse and apply Purple Top Criticaid Cream BID and PRN incontinence of urine or stool - also applying barrier cream - air mattress    Diabetes mellitus 2 - cont Lantus 8 U and SSI with meals  Thrush - started Nystatin and Diflucan 7/11- continue   Anoxic brain injury secondary to Cardiac arrest on 5/19: - slow cognitive improvement- following commands, talking & feeding herself now  Diarrhea  - resolved    Normocytic  anemia Likely secondary to chronic disease-probably worsened by acute illness. No signs of bleeding, hemoglobin has been stable with some fluctuations.   Diabetes mellitus type 2  Continue with SSI. On Lantus. HbA1c 5.9.   Chronic systolic heart failure/nonischemic  cardiomyopathy - Echocardiogram>> EF 25-30% with diffuse hypokinesis - volume management with dialysis - has been unable to tolerate Coreg due to hypotension  - will try to resume a low dose today  Hypotension - Midodrine with dialysis   Pericardial effusion Seen on TEE- no evidence of tamponade pathophysiology. Repeat echo 6/26 with no significant pericardial effusion.   Moderate protein calorie malnutrition - taking orals well now- cont Prostat  Depression?  - has been on Cymbalta at home which is currently being held- will resume    DVT prophylaxis: Heparin Code Status: Full code Family Communication: mother  Disposition Plan: to be determined Consultants:  Nephrology, pulmonary, palliative care, ID, Critical care, Ortho Procedures:  Transthoracic echocardiogram 6/28 Study Conclusions  - Left ventricle: The cavity size was normal. There was mild concentric hypertrophy. Systolic function was severely reduced. The estimated ejection fraction was in the range of 25% to 30%. Diffuse hypokinesis. Regional wall motion abnormalities cannot be excluded. - Mitral valve: There was mild regurgitation. - Pericardium, extracardiac: A trivial pericardial effusion was identified circumferential to the heart.  Left above-knee amputation. 6/15  Antimicrobials:  Anti-infectives    Start     Dose/Rate Route Frequency Ordered Stop   04/08/17 1000  fluconazole (DIFLUCAN) IVPB 100 mg     100 mg 50 mL/hr over 60 Minutes Intravenous Every 24 hours 04/08/17 0851     04/04/17 0600  ceFAZolin (ANCEF) IVPB 2g/100 mL premix  Status:  Discontinued     2 g 200 mL/hr over 30 Minutes Intravenous On call to O.R. 04/03/17 1840 04/03/17 1848   03/28/17 1600  cefTAZidime (FORTAZ) 1 g in dextrose 5 % 50 mL IVPB  Status:  Discontinued     1 g 100 mL/hr over 30 Minutes Intravenous Every 24 hours 03/27/17 0814 03/28/17 0752   03/28/17 1100  piperacillin-tazobactam (ZOSYN) IVPB 3.375 g       3.375 g 12.5 mL/hr over 240 Minutes Intravenous Every 12 hours 03/28/17 1038 04/11/17 1359   03/27/17 0815  cefTAZidime (FORTAZ) 1 g in dextrose 5 % 50 mL IVPB     1 g 100 mL/hr over 30 Minutes Intravenous  Once 03/27/17 0814 03/27/17 1026   03/17/17 1800  cefTAZidime (FORTAZ) 2 g in dextrose 5 % 50 mL IVPB  Status:  Discontinued     2 g 100 mL/hr over 30 Minutes Intravenous Every T-Th-Sa (1800) 03/15/17 1143 03/18/17 1223   03/17/17 1200  vancomycin (VANCOCIN) IVPB 1000 mg/200 mL premix  Status:  Discontinued     1,000 mg 200 mL/hr over 60 Minutes Intravenous Every T-Th-Sa (Hemodialysis) 03/15/17 1143 03/18/17 1223   03/14/17 1200  vancomycin (VANCOCIN) IVPB 1000 mg/200 mL premix  Status:  Discontinued     1,000 mg 200 mL/hr over 60 Minutes Intravenous Every T-Th-Sa (Hemodialysis) 03/12/17 0912 03/14/17 1542   03/13/17 1430  ceFAZolin (ANCEF) IVPB 2g/100 mL premix  Status:  Discontinued     2 g 200 mL/hr over 30 Minutes Intravenous To ShortStay Surgical 03/12/17 1208 03/13/17 1113   03/13/17 1115  ceFAZolin (ANCEF) IVPB 2g/100 mL premix     2 g 200 mL/hr over 30 Minutes Intravenous To Surgery 03/13/17 1108 03/13/17 1506   03/12/17 1800  cefTAZidime (FORTAZ) 2 g in dextrose  5 % 50 mL IVPB  Status:  Discontinued     2 g 100 mL/hr over 30 Minutes Intravenous Every T-Th-Sa (1800) 03/12/17 0912 03/14/17 1542   03/12/17 1200  vancomycin (VANCOCIN) 1,500 mg in sodium chloride 0.9 % 250 mL IVPB     1,500 mg 250 mL/hr over 60 Minutes Intravenous Every Thu (Hemodialysis) 03/12/17 0912 03/12/17 1356   03/02/17 1200  cefTRIAXone (ROCEPHIN) 2 g in dextrose 5 % 50 mL IVPB     2 g 100 mL/hr over 30 Minutes Intravenous Every 24 hours 03/02/17 0810 03/06/17 1358   03/02/17 1200  vancomycin (VANCOCIN) IVPB 1000 mg/200 mL premix     1,000 mg 200 mL/hr over 60 Minutes Intravenous Every M-W-F (Hemodialysis) 03/02/17 0815 03/02/17 1457   03/02/17 1200  metroNIDAZOLE (FLAGYL) IVPB 500 mg     500  mg 100 mL/hr over 60 Minutes Intravenous Every 8 hours 03/02/17 0948 03/06/17 2230   02/26/17 1200  vancomycin (VANCOCIN) IVPB 1000 mg/200 mL premix     1,000 mg 200 mL/hr over 60 Minutes Intravenous Every T-Th-Sa (Hemodialysis) 02/25/17 1151 03/05/17 1325   02/25/17 1200  vancomycin (VANCOCIN) 2,000 mg in sodium chloride 0.9 % 500 mL IVPB     2,000 mg 250 mL/hr over 120 Minutes Intravenous  Once 02/25/17 1149 02/25/17 1449   02/25/17 0900  cefTRIAXone (ROCEPHIN) 2 g in dextrose 5 % 50 mL IVPB  Status:  Discontinued     2 g 100 mL/hr over 30 Minutes Intravenous Every 24 hours 02/25/17 0814 03/02/17 0810   02/25/17 0900  metroNIDAZOLE (FLAGYL) IVPB 500 mg  Status:  Discontinued     500 mg 100 mL/hr over 60 Minutes Intravenous Every 8 hours 02/25/17 0814 03/02/17 0948   02/20/17 1200  vancomycin (VANCOCIN) IVPB 1000 mg/200 mL premix  Status:  Discontinued     1,000 mg 200 mL/hr over 60 Minutes Intravenous Every M-W-F (Hemodialysis) 02/19/17 1506 02/20/17 1403   02/17/17 1800  meropenem (MERREM) 500 mg in sodium chloride 0.9 % 50 mL IVPB  Status:  Discontinued     500 mg 100 mL/hr over 30 Minutes Intravenous Daily-1800 02/17/17 1026 02/25/17 0814   02/17/17 1200  vancomycin (VANCOCIN) IVPB 1000 mg/200 mL premix     1,000 mg 200 mL/hr over 60 Minutes Intravenous Every T-Th-Sa (Hemodialysis) 02/14/17 1758 02/19/17 1612   02/14/17 2200  clindamycin (CLEOCIN) IVPB 900 mg  Status:  Discontinued     900 mg 100 mL/hr over 30 Minutes Intravenous Every 8 hours 02/14/17 1106 02/14/17 1928   02/14/17 2000  clindamycin (CLEOCIN) IVPB 900 mg  Status:  Discontinued     900 mg 100 mL/hr over 30 Minutes Intravenous Every 8 hours 02/14/17 1928 02/16/17 1047   02/14/17 1915  piperacillin-tazobactam (ZOSYN) IVPB 3.375 g  Status:  Discontinued     3.375 g 100 mL/hr over 30 Minutes Intravenous Every 12 hours 02/14/17 1801 02/17/17 1026   02/14/17 1845  vancomycin (VANCOCIN) 2,000 mg in sodium chloride 0.9 %  500 mL IVPB     2,000 mg 250 mL/hr over 120 Minutes Intravenous  Once 02/14/17 1758 02/14/17 2107   02/14/17 1115  clindamycin (CLEOCIN) IVPB 900 mg  Status:  Discontinued     900 mg 100 mL/hr over 30 Minutes Intravenous  Once 02/14/17 1106 02/14/17 2236   02/14/17 0945  clindamycin (CLEOCIN) IVPB 600 mg     600 mg 100 mL/hr over 30 Minutes Intravenous  Once 02/14/17 0931 02/14/17 1013  Objective: Vitals:   04/10/17 0759 04/10/17 0800 04/10/17 1254 04/10/17 1343  BP: 121/86 114/73  107/79  Pulse: (!) 123   (!) 121  Resp:    (!) 24  Temp: 97.7 F (36.5 C)  97.6 F (36.4 C)   TempSrc: Axillary  Axillary   SpO2: 100%   99%  Weight:      Height:        Intake/Output Summary (Last 24 hours) at 04/10/17 1437 Last data filed at 04/10/17 1100  Gross per 24 hour  Intake              748 ml  Output                0 ml  Net              748 ml   Filed Weights   04/09/17 1345 04/09/17 1755 04/10/17 0500  Weight: 83 kg (182 lb 15.7 oz) 83 kg (182 lb 15.7 oz) 82 kg (180 lb 12.4 oz)    Examination: General exam: Appears comfortable - sitting up in chair today HEENT: PERRLA, oral mucosa moist, has severe thrush, no sclera icterus or thrush- trach intact and capped Respiratory system: Clear to auscultation. Respiratory effort normal. Cardiovascular system: S1 & S2 heard, RRR.  No murmurs  Gastrointestinal system: Abdomen soft, non-tender, nondistended. Normal bowel sound. No organomegaly Central nervous system: Alert - moving arms and legs - normal tone in extermities Extremities: No cyanosis, clubbing or edema- left AKA with wound vac Skin: multiple ulcers on sacral area- no cellulitis- right heel eschar   Data Reviewed: I have personally reviewed following labs and imaging studies  CBC:  Recent Labs Lab 04/05/17 0601 04/06/17 0443 04/07/17 0427 04/08/17 0333 04/09/17 0429  WBC 16.9* 19.7* 16.4* 13.4* 11.7*  HGB 8.4* 7.8* 7.3* 7.9* 8.1*  HCT 29.5* 27.6* 25.1* 28.2*  28.3*  MCV 99.3 99.3 97.7 98.9 97.9  PLT 459* 457* 423* 472* 371*   Basic Metabolic Panel:  Recent Labs Lab 04/04/17 0417  04/06/17 0443 04/07/17 0427 04/07/17 1415 04/08/17 0333 04/09/17 0429  NA 137  < > 137 137 135 137 138  K 4.1  < > 4.7 4.8 4.2 4.5 4.6  CL 97*  < > 95* 94* 96* 97* 97*  CO2 26  < > _0 GLUCOSE 95  < > 109* 120* 113* 99 110*  BUN 22*  < > 32* 53* 18 29* 48*  CREATININE 3.86*  < > 4.48* 5.63* 2.60* 3.61* 5.16*  CALCIUM 8.8*  < > 8.7* 8.7* 8.6* 8.7* 9.0  PHOS 5.5*  --  5.6* 6.0*  --   --   --   < > = values in this interval not displayed. GFR: Estimated Creatinine Clearance: 16.9 mL/min (A) (by C-G formula based on SCr of 5.16 mg/dL (H)). Liver Function Tests:  Recent Labs Lab 04/05/17 0601 04/06/17 0443 04/07/17 0427 04/07/17 1415 04/08/17 0333  AST 19  --   --  28 19  ALT 13*  --   --  8* 12*  ALKPHOS 234*  --   --  194* 226*  BILITOT 1.2  --   --  1.0 1.1  PROT 7.0  --   --  6.5 6.9  ALBUMIN 2.4* 2.3* 2.1* 2.2* 2.2*   No results for input(s): LIPASE, AMYLASE in the last 168 hours. No results for input(s): AMMONIA in the last 168 hours. Coagulation Profile: No results for input(s):  INR, PROTIME in the last 168 hours. Cardiac Enzymes: No results for input(s): CKTOTAL, CKMB, CKMBINDEX, TROPONINI in the last 168 hours. BNP (last 3 results) No results for input(s): PROBNP in the last 8760 hours. HbA1C: No results for input(s): HGBA1C in the last 72 hours. CBG:  Recent Labs Lab 04/09/17 0741 04/09/17 1203 04/09/17 2132 04/10/17 0758 04/10/17 1252  GLUCAP 125* 203* 132* 93 163*   Lipid Profile: No results for input(s): CHOL, HDL, LDLCALC, TRIG, CHOLHDL, LDLDIRECT in the last 72 hours. Thyroid Function Tests: No results for input(s): TSH, T4TOTAL, FREET4, T3FREE, THYROIDAB in the last 72 hours. Anemia Panel: No results for input(s): VITAMINB12, FOLATE, FERRITIN, TIBC, IRON, RETICCTPCT in the last 72 hours. Urine analysis:     Component Value Date/Time   COLORURINE AMBER (A) 02/14/2017 2111   APPEARANCEUR HAZY (A) 02/14/2017 2111   LABSPEC 1.015 02/14/2017 2111   PHURINE 5.0 02/14/2017 2111   GLUCOSEU NEGATIVE 02/14/2017 2111   HGBUR SMALL (A) 02/14/2017 2111   BILIRUBINUR NEGATIVE 02/14/2017 2111   [place] 02/14/2017 2111   PROTEINUR 30 (A) 02/14/2017 2111   UROBILINOGEN 0.2 09/21/2014 1908   NITRITE NEGATIVE 02/14/2017 2111   LEUKOCYTESUR TRACE (A) 02/14/2017 2111   Sepsis Labs: _0 (procalcitonin:4,lacticidven:4) )No results found for this or any previous visit (from the past 240 hour(s)).       Radiology Studies: No results found.    Scheduled Meds: . calcium carbonate (dosed in mg elemental calcium)  250 mg of elemental calcium Oral TID WC  . chlorhexidine gluconate (MEDLINE KIT)  15 mL Mouth Rinse BID  . darbepoetin (ARANESP) injection - DIALYSIS  200 mcg Intravenous Q Thu-HD  . dextrose  1 ampule Intravenous Once  . docusate sodium  100 mg Oral BID  . feeding supplement (PRO-STAT SUGAR FREE 64)  30 mL Oral TID  . [name]'s butt cream   Topical QID  . heparin  5,000 Units Subcutaneous Q8H  . insulin aspart  0-15 Units Subcutaneous TID WC  . insulin glargine  8 Units Subcutaneous Daily  . mouth rinse  15 mL Mouth Rinse QID  . midodrine  10 mg Oral Q T,Th,Sa-HD  . multivitamin  1 tablet Oral QHS  . nystatin  5 mL Oral QID  . saccharomyces boulardii  250 mg Oral BID   Continuous Infusions: . fluconazole (DIFLUCAN) IV Stopped (04/10/17 1100)  . piperacillin-tazobactam (ZOSYN)  IV 3.375 g (04/10/17 1305)     LOS: 55 days    Time spent in minutes: 35    Debbe Odea, MD [facility] Pager: [email or web address] Password [facility] 04/10/2017, 2:37 PM

## 2017-04-10 NOTE — Progress Notes (Signed)
Physical Therapy Treatment Patient Details Name: [name] MRN: [id number] DOB: [birth year] Today's Date: 04/10/2017    History of Present Illness [name] is a 36 y.o. 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 ETT 5/19 and trach'd 5/31. Pt now with trach on room air.     PT Comments    Pt able to tolerate OOB to chair using maxisky lift. Pt alert and following commands and able to participate.   Follow Up Recommendations  SNF;Supervision/Assistance - 24 hour     Equipment Recommendations  Other (comment) (defer to next venue)    Recommendations for Other Services       Precautions / Restrictions Precautions Precautions: Fall Precaution Comments: trach Restrictions Other Position/Activity Restrictions: PRAFO boot on RLE    Mobility  Bed Mobility Overal bed mobility: Needs Assistance Bed Mobility: Rolling Rolling: Mod assist;+2 for safety/equipment         General bed mobility comments: Assist to bring hips and shoulders over. Pt able to assist by reaching with UE's  Transfers Overall transfer level: Needs assistance               General transfer comment: Pt transferred bed to chair using maxiisky lift. After reaching chair pt needed to have BM so used maxisky to raise pt and place bedpan. Removed bedpan in same way. BP 80's/60's sitting in recliner  Ambulation/Gait                 Stairs            Wheelchair Mobility    Modified Rankin (Stroke Patients Only)       Balance Overall balance assessment: Needs assistance Sitting-balance support:  (foot supported)   Sitting balance - Comments: Sat pt forward in chair for repositioning of  lift pad. Pt required assist to maintain this position Postural control: Posterior lean                                  Cognition Arousal/Alertness: Awake/alert Behavior During Therapy: Flat affect Overall Cognitive Status: Impaired/Different from baseline Area of Impairment: Memory;Following commands;Safety/judgement;Problem solving;Attention                   Current Attention Level: Sustained Memory: Decreased recall of precautions;Decreased short-term memory Following Commands: Follows one step commands with increased time Safety/Judgement: Decreased awareness of deficits;Decreased awareness of safety Awareness: Intellectual Problem Solving: Slow processing;Decreased initiation;Difficulty sequencing;Requires tactile cues;Requires verbal cues        Exercises      General Comments        Pertinent Vitals/Pain Pain Assessment: No/denies pain    Home Living                      Prior Function            PT Goals (current goals can now be found in the care plan section) Progress towards PT goals: Progressing toward goals    Frequency    Min 2X/week      PT Plan Current plan remains appropriate;Frequency needs to be updated  Co-evaluation              AM-PAC PT "6 Clicks" Daily Activity  Outcome Measure  Difficulty turning over in bed (including adjusting bedclothes, sheets and blankets)?: Total Difficulty moving from lying on back to sitting on the side of the bed? : Total Difficulty sitting down on and standing up from a chair with arms (e.g., wheelchair, bedside commode, etc,.)?: Total Help needed moving to and from a bed to chair (including a wheelchair)?: Total Help needed walking in hospital room?: Total Help needed climbing 3-5 steps with a railing? : Total 6 Click Score: 6    End of Session   Activity Tolerance: Patient tolerated treatment well Patient left: in bed;with call bell/phone within reach;with  family/visitor present;Other (comment);with restraints reapplied Primary school teacher) Nurse Communication: Mobility status;Need for lift equipment PT Visit Diagnosis: Muscle weakness (generalized) (M62.81);Other abnormalities of gait and mobility (R26.89)     Time: 0102-7253 PT Time Calculation (min) (ACUTE ONLY): 37 min  Charges:  $Therapeutic Activity: 23-37 mins                    G Codes:       [facility] PT [phone]    Angelina Ok [facility] 04/10/2017, 11:21 AM

## 2017-04-10 NOTE — Progress Notes (Signed)
[facility] Rounding Note  Subjective:  Sitting up in chair , working w/ speech Rx  Vital signs in last 24 hours: Vitals:   04/10/17 0700 04/10/17 0745 04/10/17 0759 04/10/17 0800  BP:  113/80 121/86 114/73  Pulse:  (!) 124 (!) 123   Resp:  (!) 30    Temp:   97.7 F (36.5 C)   TempSrc:   Axillary   SpO2: 100% 100% 100%   Weight:      Height:       Weight change: -4 kg (-8 lb 13.1 oz)  Intake/Output Summary (Last 24 hours) at 04/10/17 1224 Last data filed at 04/10/17 1100  Gross per 24 hour  Intake              748 ml  Output                0 ml  Net              748 ml   Physical Exam: Alert, smiling, talking Trach/trach collar Anteriorly coarse BS but o/w clear S1S2 No S3 HR 120 Abd obese, soft and non tender Left AKA with wound VAC in place No LE or hip edema Right foot in protective boot Dialysis Access: Left AVF + bruit and thrill  Dialysis: TTS [facility] 4h 1mn   F200  114kg   2/2 bath  LUA AVF  Hep none - Iron Sucrose (Venofer) 50 mg IVP During Dialysis 1X Week  - Mircera 75 mcg IV q  2 weeks ( last on 02/05/17) - Vitamin D (Calcitriol) Oral 0.75 mcg  Po q hd   Summary: 36year old female w/ ESRD d/t poorly controlled DM, HD bilateral TMA's, obesity, HTN, medical non-compliance, NICM. Admitted 5/19 w/left leg pain, + radiological evidence of osteomyelitis w/wound nonhealing, dehiscence, purulent drainage. Admitted for IV antibiotics, hemodialysis and possible surgery. Found unresponsive in her room that afternoon - cardiac arrest > rec'd  CPR, epi bicarb; intubated; cooled. Now with anoxic brain injury, trach and s/p L AKA this hosp. Course further complicated by recurrent aspiration PNA, dehiscence of AK wound requiring revision 7/6.   Assessment: 1. Anoxic brain injury sp arrest - pt is awake, speaking and eating now. Much better.  2. Trach - on trach collar. Not sure what the plan is for decannulation. Not on vent since 6/20.  3. Fever/ PNA /^WBC - completed abx  7/12 4. Tachycardia - not sure cause, don't attempt to treat w MTP, doesn't tolerate 5. ESRD -  TTS HD, started HD June '17. HD hampered by hypotension. Added midodrine preHD 6. Anemia- on max aranesp- hgb stable in 8's  7. MBD of CKD - Ca and Phos in range, on calc suspension. PTH 88 here.   8. 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   9. DM2 - per primary 10. sHF - EF 25-30% contributes to BP issues on HD 11. Nutrition- unclear if will be able to take enough in to meet needs, albumin 3.0 12. Dispo- difficult with ESRD and trach- [place] Medicaid so not sure LTAC is option.    Plan - HD Sat upstairs   RKelly SplinterMD [facility] ([phone]  04/06/2017, 11:14 AM   Recent Labs Lab 04/04/17 0417  04/06/17 0443 04/07/17 0427 04/07/17 1415 04/08/17 0333 04/09/17 0429  NA 137  < > 137 137 135 137 138  K 4.1  < > 4.7 4.8 4.2 4.5 4.6  CL  97*  < > 95* 94* 96* 97* 97*  CO2 26  < > 26 27 28 27 27   GLUCOSE 95  < > 109* 120* 113* 99 110*  BUN 22*  < > 32* 53* 18 29* 48*  CREATININE 3.86*  < > 4.48* 5.63* 2.60* 3.61* 5.16*  CALCIUM 8.8*  < > 8.7* 8.7* 8.6* 8.7* 9.0  PHOS 5.5*  --  5.6* 6.0*  --   --   --   < > = values in this interval not displayed.  Recent Labs Lab 04/05/17 0601  04/07/17 0427 04/07/17 1415 04/08/17 0333  AST 19  --   --  28 19  ALT 13*  --   --  8* 12*  ALKPHOS 234*  --   --  194* 226*  BILITOT 1.2  --   --  1.0 1.1  PROT 7.0  --   --  6.5 6.9  ALBUMIN 2.4*  < > 2.1* 2.2* 2.2*  < > = values in this interval not displayed.  Recent Labs Lab 04/05/17 0601 04/06/17 0443 04/07/17 0427 04/08/17 0333 04/09/17 0429  WBC 16.9* 19.7* 16.4* 13.4* 11.7*  HGB 8.4* 7.8* 7.3* 7.9* 8.1*  HCT 29.5* 27.6* 25.1* 28.2* 28.3*  MCV 99.3 99.3 97.7 98.9 97.9  PLT 459* 457* 423* 472* 469*     Recent Labs Lab 04/09/17 0344 04/09/17 0741 04/09/17 1203 04/09/17 2132 04/10/17 0758  GLUCAP 112* 125* 203* 132* 93     Medications: Infusions: . fluconazole (DIFLUCAN) IV Stopped (04/10/17 1100)  . piperacillin-tazobactam (ZOSYN)  IV Stopped (04/10/17 0800)    Scheduled Medications: . calcium carbonate (dosed in mg elemental calcium)  250 mg of elemental calcium Oral TID WC  . chlorhexidine gluconate (MEDLINE KIT)  15 mL Mouth Rinse BID  . darbepoetin (ARANESP) injection - DIALYSIS  200 mcg Intravenous Q Thu-HD  . dextrose  1 ampule Intravenous Once  . docusate sodium  100 mg Oral BID  . feeding supplement (PRO-STAT SUGAR FREE 64)  30 mL Oral TID  . [name]'s butt cream   Topical QID  . heparin  5,000 Units Subcutaneous Q8H  . insulin aspart  0-15 Units Subcutaneous TID WC  . insulin glargine  8 Units Subcutaneous Daily  . mouth rinse  15 mL Mouth Rinse QID  . midodrine  10 mg Oral Q T,Th,Sa-HD  . multivitamin  1 tablet Oral QHS  . nystatin  5 mL Oral QID  . saccharomyces boulardii  250 mg Oral BID

## 2017-04-10 NOTE — Progress Notes (Signed)
CSW continuing to follow for placement needs- patient currently capped and considering decannulation if stable.  Per RN pt has been sitting in chair in room- CSW will continue to follow and send out new referral to local SNFs Monday if pt can be decannulated and able to sit for dialysis then she can be placed locally  Burna Sis, LCSW Clinical Social Worker [phone]

## 2017-04-11 DIAGNOSIS — J9500 Unspecified tracheostomy complication: Secondary | ICD-10-CM

## 2017-04-11 LAB — BASIC METABOLIC PANEL
ANION GAP: 13 (ref 5–15)
BUN: 33 mg/dL — ABNORMAL HIGH (ref 6–20)
CHLORIDE: 94 mmol/L — AB (ref 101–111)
CO2: 28 mmol/L (ref 22–32)
Calcium: 9.2 mg/dL (ref 8.9–10.3)
Creatinine, Ser: 4.12 mg/dL — ABNORMAL HIGH (ref 0.44–1.00)
GFR calc Af Amer: 15 mL/min — ABNORMAL LOW (ref >60)
GFR, EST NON AFRICAN AMERICAN: 13 mL/min — AB (ref >60)
Glucose, Bld: 104 mg/dL — ABNORMAL HIGH (ref 65–99)
POTASSIUM: 3.9 mmol/L (ref 3.5–5.1)
SODIUM: 135 mmol/L (ref 135–145)

## 2017-04-11 LAB — CBC
HEMATOCRIT: 29 % — AB (ref 36.0–46.0)
HEMOGLOBIN: 8.3 g/dL — AB (ref 12.0–15.0)
MCH: 28 pg (ref 26.0–34.0)
MCHC: 28.6 g/dL — ABNORMAL LOW (ref 30.0–36.0)
MCV: 98 fL (ref 78.0–100.0)
Platelets: 397 10*3/uL (ref 150–400)
RBC: 2.96 MIL/uL — ABNORMAL LOW (ref 3.87–5.11)
RDW: 18 % — ABNORMAL HIGH (ref 11.5–15.5)
WBC: 10.1 10*3/uL (ref 4.0–10.5)

## 2017-04-11 LAB — GLUCOSE, CAPILLARY
GLUCOSE-CAPILLARY: 105 mg/dL — AB (ref 65–99)
GLUCOSE-CAPILLARY: 141 mg/dL — AB (ref 65–99)
Glucose-Capillary: 105 mg/dL — ABNORMAL HIGH (ref 65–99)

## 2017-04-11 NOTE — Progress Notes (Signed)
Trach dislodged earlier today.  RT made [place] aware.  No issues w/ replacing it.   Plan Cont capped trach over weekend We will most likely de-cannulate on Monday if no issues arise over weekend Call if needed   Simonne Martinet ACNP-BC [facility] Pulmonary/Critical Care Pager # [phone] OR # [phone] if no answer

## 2017-04-11 NOTE — Progress Notes (Signed)
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   Admitted on 02/14/17 to [facility] with left foot/leg pain, wound dehiscence and purulent drainage from the left trans-metatarsal amputation site.   She was subsequently found to be unresponsive on 5/19. Noted to bein asystole and CPR started with ROSC in approximately 9 minutes. She was intubated by anaesthesia, transferred to the intensive care unit and subsequently underwent cooling protocol. She was unable to be liberated off the ventilator and as a result underwent tracheostomy on 5/31.  Neurological/ Cognitive status has been poor. She has been evaluated by neurology on 5/29 and, per neurology, chances of meaningful neurological recovery appeared to be dismal.  Hospital course complicated by persistent leukocytosis, respiratory failure and left transmetatarsal wound dehiscence with gangrene. 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  She has subsequently had recurrent aspiration pneumonias.  CXR on 6/29 showed a RLL infiltrate.  Subjective: Lurline Idol is out this AM. Patient I speaking and tells me she feels fine. No complaints to me. Mother at bedside is not aware that trach has come out.   Assessment & Plan: Acute /chronichypoxemic vent dependent respiratory failure in a setting of cardiac arrest Aspiration pneumonia- RLL  Tracheostomy  - Intubated 6/19-6/20.  5/31- tracheostomy- trach out this AM- appears quite stable without it- paged pulmonary and respiratory at bedside - PCCM following for resp issues  - S/P MBS 6/23. Now status post FEES 6/27. Patient started on nectar thick liquids and  dysphagia 1 diet 6/27. Holding off on PEG   - has had aspiration issues- recurrent aspiration on 6/28-  Repeat chest x-ray on 6/29 showed new right-sided infiltrate - She was started on Fortaz on 6/29 and changed over to [place] on 6/30   - Chest x-ray from 7/1 showed improving opacity in the right lung. - has completed 14 days of Zosyn  - has been using passy-muir valve while awake and was doing well with it   ESRD On HD TTS  per Nephrology - will be dialyzed today  Sinus tachycardia - likely due to severe deconditioning - on Midodrine during dialysis days which can help with this -  Resumed Coreg 3.125 BID on non-dialysis days- BP stable at 127/97 today- HR in one teens and improved from 32s (note Lurline Idol is also out this AM)   Left trans-metatarsal stump wound dehiscence with infection and gangrene - underwent AKA 6/15 - Per operative note, pus/abscess noted extending up to the knee when BKA was attempted and she subsequently underwent a AKA. Note, initially family was very reluctant to proceed with amputation, but given persistent leukocytosis/fever the family agreed for amputation - Orthopedics removed wound VAC 6/20 - revision of her left AKA  04/03/17   - currently with wound vac   Right heel eschar - wound care RN following - Prevalon boot  Ulcers on sacral area- incontinence associated dermatitis - WOC >> Cleanse and apply Purple Top Criticaid Cream BID and PRN incontinence of urine or stool - also applying barrier cream - air mattress    Diabetes mellitus 2 - cont Lantus 8 U and SSI with meals  Thrush - started Nystatin and Diflucan 7/11- continue   Anoxic brain injury secondary  to Cardiac arrest on 5/19: - slow cognitive improvement- following commands, talking & feeding herself now  Diarrhea  - resolved    Normocytic anemia Likely secondary to chronic disease-probably worsened by acute illness. No signs of bleeding, hemoglobin has been stable with some  fluctuations.   Diabetes mellitus type 2  Continue with SSI. On Lantus. HbA1c 5.9.   Chronic systolic heart failure/nonischemic cardiomyopathy - Echocardiogram>> EF 25-30% with diffuse hypokinesis - volume management with dialysis - has been unable to tolerate Coreg due to hypotension  - resumed yesterday for non-dialysis day only - BP looks stable this AM  Hypotension - Midodrine with dialysis - BP up today 127/97- following   Pericardial effusion Seen on TEE- no evidence of tamponade pathophysiology. Repeat echo 6/26 with no significant pericardial effusion.   Moderate protein calorie malnutrition - taking orals well now- cont Prostat  Depression?  - has been on Cymbalta at home which is currently being held- will resume    DVT prophylaxis: Heparin Code Status: Full code Family Communication: mother  Disposition Plan: to be determined Consultants:  Nephrology, pulmonary, palliative care, ID, Critical care, Ortho Procedures:  Transthoracic echocardiogram 6/28 Study Conclusions  - Left ventricle: The cavity size was normal. There was mild concentric hypertrophy. Systolic function was severely reduced. The estimated ejection fraction was in the range of 25% to 30%. Diffuse hypokinesis. Regional wall motion abnormalities cannot be excluded. - Mitral valve: There was mild regurgitation. - Pericardium, extracardiac: A trivial pericardial effusion was identified circumferential to the heart.  Left above-knee amputation. 6/15  Antimicrobials:  Anti-infectives    Start     Dose/Rate Route Frequency Ordered Stop   04/08/17 1000  fluconazole (DIFLUCAN) IVPB 100 mg     100 mg 50 mL/hr over 60 Minutes Intravenous Every 24 hours 04/08/17 0851     04/04/17 0600  ceFAZolin (ANCEF) IVPB 2g/100 mL premix  Status:  Discontinued     2 g 200 mL/hr over 30 Minutes Intravenous On call to O.R. 04/03/17 1840 04/03/17 1848   03/28/17 1600  cefTAZidime (FORTAZ) 1 g in  dextrose 5 % 50 mL IVPB  Status:  Discontinued     1 g 100 mL/hr over 30 Minutes Intravenous Every 24 hours 03/27/17 0814 03/28/17 0752   03/28/17 1100  piperacillin-tazobactam (ZOSYN) IVPB 3.375 g     3.375 g 12.5 mL/hr over 240 Minutes Intravenous Every 12 hours 03/28/17 1038 04/11/17 1359   03/27/17 0815  cefTAZidime (FORTAZ) 1 g in dextrose 5 % 50 mL IVPB     1 g 100 mL/hr over 30 Minutes Intravenous  Once 03/27/17 0814 03/27/17 1026   03/17/17 1800  cefTAZidime (FORTAZ) 2 g in dextrose 5 % 50 mL IVPB  Status:  Discontinued     2 g 100 mL/hr over 30 Minutes Intravenous Every T-Th-Sa (1800) 03/15/17 1143 03/18/17 1223   03/17/17 1200  vancomycin (VANCOCIN) IVPB 1000 mg/200 mL premix  Status:  Discontinued     1,000 mg 200 mL/hr over 60 Minutes Intravenous Every T-Th-Sa (Hemodialysis) 03/15/17 1143 03/18/17 1223   03/14/17 1200  vancomycin (VANCOCIN) IVPB 1000 mg/200 mL premix  Status:  Discontinued     1,000 mg 200 mL/hr over 60 Minutes Intravenous Every T-Th-Sa (Hemodialysis) 03/12/17 0912 03/14/17 1542   03/13/17 1430  ceFAZolin (ANCEF) IVPB 2g/100 mL premix  Status:  Discontinued     2 g 200 mL/hr over 30 Minutes Intravenous To ShortStay Surgical 03/12/17 1208 03/13/17 1113   03/13/17 1115  ceFAZolin (  ANCEF) IVPB 2g/100 mL premix     2 g 200 mL/hr over 30 Minutes Intravenous To Surgery 03/13/17 1108 03/13/17 1506   03/12/17 1800  cefTAZidime (FORTAZ) 2 g in dextrose 5 % 50 mL IVPB  Status:  Discontinued     2 g 100 mL/hr over 30 Minutes Intravenous Every T-Th-Sa (1800) 03/12/17 0912 03/14/17 1542   03/12/17 1200  vancomycin (VANCOCIN) 1,500 mg in sodium chloride 0.9 % 250 mL IVPB     1,500 mg 250 mL/hr over 60 Minutes Intravenous Every Thu (Hemodialysis) 03/12/17 0912 03/12/17 1356   03/02/17 1200  cefTRIAXone (ROCEPHIN) 2 g in dextrose 5 % 50 mL IVPB     2 g 100 mL/hr over 30 Minutes Intravenous Every 24 hours 03/02/17 0810 03/06/17 1358   03/02/17 1200  vancomycin (VANCOCIN)  IVPB 1000 mg/200 mL premix     1,000 mg 200 mL/hr over 60 Minutes Intravenous Every M-W-F (Hemodialysis) 03/02/17 0815 03/02/17 1457   03/02/17 1200  metroNIDAZOLE (FLAGYL) IVPB 500 mg     500 mg 100 mL/hr over 60 Minutes Intravenous Every 8 hours 03/02/17 0948 03/06/17 2230   02/26/17 1200  vancomycin (VANCOCIN) IVPB 1000 mg/200 mL premix     1,000 mg 200 mL/hr over 60 Minutes Intravenous Every T-Th-Sa (Hemodialysis) 02/25/17 1151 03/05/17 1325   02/25/17 1200  vancomycin (VANCOCIN) 2,000 mg in sodium chloride 0.9 % 500 mL IVPB     2,000 mg 250 mL/hr over 120 Minutes Intravenous  Once 02/25/17 1149 02/25/17 1449   02/25/17 0900  cefTRIAXone (ROCEPHIN) 2 g in dextrose 5 % 50 mL IVPB  Status:  Discontinued     2 g 100 mL/hr over 30 Minutes Intravenous Every 24 hours 02/25/17 0814 03/02/17 0810   02/25/17 0900  metroNIDAZOLE (FLAGYL) IVPB 500 mg  Status:  Discontinued     500 mg 100 mL/hr over 60 Minutes Intravenous Every 8 hours 02/25/17 0814 03/02/17 0948   02/20/17 1200  vancomycin (VANCOCIN) IVPB 1000 mg/200 mL premix  Status:  Discontinued     1,000 mg 200 mL/hr over 60 Minutes Intravenous Every M-W-F (Hemodialysis) 02/19/17 1506 02/20/17 1403   02/17/17 1800  meropenem (MERREM) 500 mg in sodium chloride 0.9 % 50 mL IVPB  Status:  Discontinued     500 mg 100 mL/hr over 30 Minutes Intravenous Daily-1800 02/17/17 1026 02/25/17 0814   02/17/17 1200  vancomycin (VANCOCIN) IVPB 1000 mg/200 mL premix     1,000 mg 200 mL/hr over 60 Minutes Intravenous Every T-Th-Sa (Hemodialysis) 02/14/17 1758 02/19/17 1612   02/14/17 2200  clindamycin (CLEOCIN) IVPB 900 mg  Status:  Discontinued     900 mg 100 mL/hr over 30 Minutes Intravenous Every 8 hours 02/14/17 1106 02/14/17 1928   02/14/17 2000  clindamycin (CLEOCIN) IVPB 900 mg  Status:  Discontinued     900 mg 100 mL/hr over 30 Minutes Intravenous Every 8 hours 02/14/17 1928 02/16/17 1047   02/14/17 1915  piperacillin-tazobactam (ZOSYN) IVPB  3.375 g  Status:  Discontinued     3.375 g 100 mL/hr over 30 Minutes Intravenous Every 12 hours 02/14/17 1801 02/17/17 1026   02/14/17 1845  vancomycin (VANCOCIN) 2,000 mg in sodium chloride 0.9 % 500 mL IVPB     2,000 mg 250 mL/hr over 120 Minutes Intravenous  Once 02/14/17 1758 02/14/17 2107   02/14/17 1115  clindamycin (CLEOCIN) IVPB 900 mg  Status:  Discontinued     900 mg 100 mL/hr over 30 Minutes Intravenous  Once 02/14/17  1106 02/14/17 2236   02/14/17 0945  clindamycin (CLEOCIN) IVPB 600 mg     600 mg 100 mL/hr over 30 Minutes Intravenous  Once 02/14/17 0931 02/14/17 1013       Objective: Vitals:   04/11/17 0417 04/11/17 0740 04/11/17 0745 04/11/17 0800  BP: 112/71   (!) 127/97  Pulse: (!) 116     Resp: (!) 9   (!) 29  Temp: (!) 97.4 F (36.3 C) 97.9 F (36.6 C)    TempSrc: Oral Oral    SpO2: 96%  96%   Weight: 78 kg (171 lb 15.3 oz)     Height:        Intake/Output Summary (Last 24 hours) at 04/11/17 0943 Last data filed at 04/11/17 0107  Gross per 24 hour  Intake              390 ml  Output                0 ml  Net              390 ml   Filed Weights   04/09/17 1755 04/10/17 0500 04/11/17 0417  Weight: 83 kg (182 lb 15.7 oz) 82 kg (180 lb 12.4 oz) 78 kg (171 lb 15.3 oz)    Examination: General exam: Appears comfortable -  HEENT: PERRLA, oral mucosa moist, has severe thrush, no sclera icterus or thrush- trach is out Respiratory system: Clear to auscultation. Respiratory effort normal. Cardiovascular system: S1 & S2 heard, RRR.  No murmurs  Gastrointestinal system: Abdomen soft, non-tender, nondistended. Normal bowel sound. No organomegaly Central nervous system: Alert - moving arms and legs - normal tone in extermities Extremities: No cyanosis, clubbing or edema- left AKA with wound vac, prior right transmetatarsal amputation Skin: multiple ulcers on sacral area- no cellulitis- right heel eschar   Data Reviewed: I have personally reviewed following labs  and imaging studies  CBC:  Recent Labs Lab 04/06/17 0443 04/07/17 0427 04/08/17 0333 04/09/17 0429 04/11/17 0341  WBC 19.7* 16.4* 13.4* 11.7* 10.1  HGB 7.8* 7.3* 7.9* 8.1* 8.3*  HCT 27.6* 25.1* 28.2* 28.3* 29.0*  MCV 99.3 97.7 98.9 97.9 98.0  PLT 457* 423* 472* 469* 147   Basic Metabolic Panel:  Recent Labs Lab 04/06/17 0443 04/07/17 0427 04/07/17 1415 04/08/17 0333 04/09/17 0429 04/11/17 0341  NA 137 137 135 137 138 135  K 4.7 4.8 4.2 4.5 4.6 3.9  CL 95* 94* 96* 97* 97* 94*  CO2 _0 GLUCOSE 109* 120* 113* 99 110* 104*  BUN 32* 53* 18 29* 48* 33*  CREATININE 4.48* 5.63* 2.60* 3.61* 5.16* 4.12*  CALCIUM 8.7* 8.7* 8.6* 8.7* 9.0 9.2  PHOS 5.6* 6.0*  --   --   --   --    GFR: Estimated Creatinine Clearance: 19.2 mL/min (A) (by C-G formula based on SCr of 4.12 mg/dL (H)). Liver Function Tests:  Recent Labs Lab 04/05/17 0601 04/06/17 0443 04/07/17 0427 04/07/17 1415 04/08/17 0333  AST 19  --   --  28 19  ALT 13*  --   --  8* 12*  ALKPHOS 234*  --   --  194* 226*  BILITOT 1.2  --   --  1.0 1.1  PROT 7.0  --   --  6.5 6.9  ALBUMIN 2.4* 2.3* 2.1* 2.2* 2.2*   No results for input(s): LIPASE, AMYLASE in the last 168 hours. No results for input(s): AMMONIA in the  last 168 hours. Coagulation Profile: No results for input(s): INR, PROTIME in the last 168 hours. Cardiac Enzymes: No results for input(s): CKTOTAL, CKMB, CKMBINDEX, TROPONINI in the last 168 hours. BNP (last 3 results) No results for input(s): PROBNP in the last 8760 hours. HbA1C: No results for input(s): HGBA1C in the last 72 hours. CBG:  Recent Labs Lab 04/10/17 0758 04/10/17 1252 04/10/17 1635 04/10/17 2140 04/11/17 0746  GLUCAP 93 163* 140* 165* 105*   Lipid Profile: No results for input(s): CHOL, HDL, LDLCALC, TRIG, CHOLHDL, LDLDIRECT in the last 72 hours. Thyroid Function Tests: No results for input(s): TSH, T4TOTAL, FREET4, T3FREE, THYROIDAB in the last 72  hours. Anemia Panel: No results for input(s): VITAMINB12, FOLATE, FERRITIN, TIBC, IRON, RETICCTPCT in the last 72 hours. Urine analysis:    Component Value Date/Time   COLORURINE AMBER (A) 02/14/2017 2111   APPEARANCEUR HAZY (A) 02/14/2017 2111   LABSPEC 1.015 02/14/2017 2111   PHURINE 5.0 02/14/2017 2111   GLUCOSEU NEGATIVE 02/14/2017 2111   HGBUR SMALL (A) 02/14/2017 2111   BILIRUBINUR NEGATIVE 02/14/2017 2111   [place] 02/14/2017 2111   PROTEINUR 30 (A) 02/14/2017 2111   UROBILINOGEN 0.2 09/21/2014 1908   NITRITE NEGATIVE 02/14/2017 2111   LEUKOCYTESUR TRACE (A) 02/14/2017 2111   Sepsis Labs: _0 (procalcitonin:4,lacticidven:4) )No results found for this or any previous visit (from the past 240 hour(s)).       Radiology Studies: No results found.    Scheduled Meds: . calcium carbonate (dosed in mg elemental calcium)  250 mg of elemental calcium Oral TID WC  . carvedilol  3.125 mg Oral BID WC  . chlorhexidine gluconate (MEDLINE KIT)  15 mL Mouth Rinse BID  . darbepoetin (ARANESP) injection - DIALYSIS  200 mcg Intravenous Q Thu-HD  . dextrose  1 ampule Intravenous Once  . docusate sodium  100 mg Oral BID  . DULoxetine  30 mg Oral Daily  . feeding supplement (PRO-STAT SUGAR FREE 64)  30 mL Oral TID  . [name]'s butt cream   Topical QID  . heparin  5,000 Units Subcutaneous Q8H  . insulin aspart  0-15 Units Subcutaneous TID WC  . insulin glargine  8 Units Subcutaneous Daily  . mouth rinse  15 mL Mouth Rinse QID  . midodrine  10 mg Oral Q T,Th,Sa-HD  . multivitamin  1 tablet Oral QHS  . nystatin  5 mL Oral QID   Continuous Infusions: . fluconazole (DIFLUCAN) IV Stopped (04/10/17 1100)  . piperacillin-tazobactam (ZOSYN)  IV Stopped (04/11/17 0507)     LOS: 56 days    Time spent in minutes: 35    Debbe Odea, MD [facility] Pager: [email or web address] Password [facility] 04/11/2017, 9:43 AM

## 2017-04-11 NOTE — Progress Notes (Signed)
RT note- Called for patients trach dislodged, no distress, replaced without difficulty, BBS equal, RN at bedside, continue to monitor.

## 2017-04-11 NOTE — Procedures (Signed)
PT doing well, getting up in chair every day, trach issues are clearing up and they may dc the trach on Monday.    I was present at this dialysis session, have reviewed the session itself and made  appropriate changes Vinson Moselle MD [facility] pager [phone]   04/11/2017, 12:54 PM

## 2017-04-12 LAB — GLUCOSE, CAPILLARY
GLUCOSE-CAPILLARY: 113 mg/dL — AB (ref 65–99)
Glucose-Capillary: 128 mg/dL — ABNORMAL HIGH (ref 65–99)
Glucose-Capillary: 165 mg/dL — ABNORMAL HIGH (ref 65–99)
Glucose-Capillary: 135 mg/dL — ABNORMAL HIGH (ref 65–99)

## 2017-04-12 MED ORDER — CARVEDILOL 6.25 MG PO TABS
6.2500 mg | ORAL_TABLET | Freq: Two times a day (BID) | ORAL | Status: DC
  Administered 2017-04-12 – 2017-04-30 (×33): 6.25 mg via ORAL
  Filled 2017-04-12 (×33): qty 1

## 2017-04-12 MED ORDER — FLUCONAZOLE 100 MG PO TABS
100.0000 mg | ORAL_TABLET | Freq: Every day | ORAL | Status: AC
  Administered 2017-04-13: 100 mg via ORAL
  Filled 2017-04-12 (×2): qty 1

## 2017-04-12 NOTE — Progress Notes (Signed)
[facility] Rounding Note  Subjective:  Lethargic today , but arousable. BP's higher than usual.  HR 130 today.   Vital signs in last 24 hours: Vitals:   04/12/17 0600 04/12/17 0821 04/12/17 0851 04/12/17 0900  BP: (!) 149/109 (!) 140/94  136/88  Pulse: (!) 134 (!) 129    Resp: (!) 28 (!) 31    Temp:  98.1 F (36.7 C)    TempSrc:  Axillary    SpO2: 97%  97% 100%  Weight:      Height:       Weight change: 2 kg (4 lb 6.6 oz)  Intake/Output Summary (Last 24 hours) at 04/12/17 1034 Last data filed at 04/11/17 1710  Gross per 24 hour  Intake               50 ml  Output             1000 ml  Net             -950 ml   Physical Exam: Alert, smiling, talking Trach/trach collar Anteriorly coarse BS but o/w clear S1S2 No S3 HR 120 Abd obese, soft and non tender Left AKA with wound VAC in place No LE or hip edema Right foot in protective boot Dialysis Access: Left AVF + bruit and thrill  Dialysis: TTS [facility] 4h 49mn   F200  114kg   2/2 bath  LUA AVF  Hep none - Iron Sucrose (Venofer) 50 mg IVP During Dialysis 1X Week  - Mircera 75 mcg IV q  2 weeks ( last on 02/05/17) - Vitamin D (Calcitriol) Oral 0.75 mcg  Po q hd   Summary: 36year old female w/ ESRD d/t poorly controlled DM, HD bilateral TMA's, obesity, HTN, medical non-compliance, NICM. Admitted 5/19 w/left leg pain, + radiological evidence of osteomyelitis w/wound nonhealing, dehiscence, purulent drainage. Admitted for IV antibiotics, hemodialysis and possible surgery. Found unresponsive in her room that afternoon - cardiac arrest > rec'd  CPR, epi bicarb; intubated; cooled. Now with anoxic brain injury, trach and s/p L AKA this hosp. Course further complicated by recurrent aspiration PNA, dehiscence of AK wound requiring revision 7/6.   Assessment: 1. Anoxic brain injury sp arrest - pt awake, speaking, eating now. Improved.  2. Trach - on trach collar. Not on vent since 6/20. Possible decannulation soon, per CCM.  3. Fever/ PNA /^WBC -  completed abx 7/12 4. ESRD -  TTS HD, started HD June '17.  5. HTN - BP's up the last 48 hrs, on low dose Coreg. Will dc midodrine preHD.  6. Anemia- on max aranesp- hgb stable in 8's  7. MBD of CKD - phos in range, Ca++ creeping up, will dc CaCO3. PTH 88 here.   8. 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   9. DM2 - per primary 10. sHF - EF 25-30% contributes to BP issues on HD 11. Nutrition- unclear if will be able to take enough in to meet needs, albumin 2.2 12. Dispo- difficult with ESRD and trach- [place] Medicaid so not sure LTAC is option.    Plan - HD Tuesday, dc CaCO3   RKelly SplinterMD CTilden Community Hospitalpgr [phone]  04/06/2017, 11:14 AM   Recent Labs Lab 04/06/17 0443 04/07/17 0427  04/08/17 0333 04/09/17 0429 04/11/17 0341  NA 137 137  < > 137 138 135  K 4.7 4.8  < > 4.5 4.6 3.9  CL 95* 94*  < >  97* 97* 94*  CO2 26 27  < > 27 27 28   GLUCOSE 109* 120*  < > 99 110* 104*  BUN 32* 53*  < > 29* 48* 33*  CREATININE 4.48* 5.63*  < > 3.61* 5.16* 4.12*  CALCIUM 8.7* 8.7*  < > 8.7* 9.0 9.2  PHOS 5.6* 6.0*  --   --   --   --   < > = values in this interval not displayed.  Recent Labs Lab 04/07/17 0427 04/07/17 1415 04/08/17 0333  AST  --  28 19  ALT  --  8* 12*  ALKPHOS  --  194* 226*  BILITOT  --  1.0 1.1  PROT  --  6.5 6.9  ALBUMIN 2.1* 2.2* 2.2*    Recent Labs Lab 04/06/17 0443 04/07/17 0427 04/08/17 0333 04/09/17 0429 04/11/17 0341  WBC 19.7* 16.4* 13.4* 11.7* 10.1  HGB 7.8* 7.3* 7.9* 8.1* 8.3*  HCT 27.6* 25.1* 28.2* 28.3* 29.0*  MCV 99.3 97.7 98.9 97.9 98.0  PLT 457* 423* 472* 469* 397     Recent Labs Lab 04/10/17 2140 04/11/17 0746 04/11/17 1821 04/11/17 2119 04/12/17 0852  GLUCAP 165* 105* 105* 141* 128*    Medications: Infusions: . fluconazole (DIFLUCAN) IV 100 mg (04/12/17 1000)    Scheduled Medications: . calcium carbonate (dosed in mg elemental calcium)  250 mg of elemental calcium Oral TID WC  . carvedilol   3.125 mg Oral BID WC  . chlorhexidine gluconate (MEDLINE KIT)  15 mL Mouth Rinse BID  . darbepoetin (ARANESP) injection - DIALYSIS  200 mcg Intravenous Q Thu-HD  . dextrose  1 ampule Intravenous Once  . docusate sodium  100 mg Oral BID  . DULoxetine  30 mg Oral Daily  . feeding supplement (PRO-STAT SUGAR FREE 64)  30 mL Oral TID  . [name]'s butt cream   Topical QID  . heparin  5,000 Units Subcutaneous Q8H  . insulin aspart  0-15 Units Subcutaneous TID WC  . insulin glargine  8 Units Subcutaneous Daily  . mouth rinse  15 mL Mouth Rinse QID  . midodrine  10 mg Oral Q T,Th,Sa-HD  . multivitamin  1 tablet Oral QHS  . nystatin  5 mL Oral QID

## 2017-04-12 NOTE — Plan of Care (Signed)
Problem: Health Behavior/Discharge Planning: Goal: Ability to manage health-related needs will improve Outcome: Progressing Patient [name] has been capped with plans for possible decannulation 7/16 if tolerated well. Patient tolerating well on room air.   Problem: Skin Integrity: Goal: Risk for impaired skin integrity will decrease Outcome: Progressing [name]'s butt cream applied to wounds on buttocks. Stump elevated and negative pressure wound therapy maintained.   Problem: Health Behavior/Discharge Planning: Goal: Ability to safely manage health related needs after discharge will improve Outcome: Completed/Met Date Met: 04/12/17 Working towards discharge.   Problem: Activity: Goal: Ability to tolerate increased activity will improve Outcome: Completed/Met Date Met: 04/12/17 Patient able to sit up in bed and tolerate being up in chair for short amounts of time. Continues to work with PT.   Problem: Coping: Goal: Level of anxiety will decrease Outcome: Completed/Met Date Met: 04/12/17 Patient restless but no longer on the ventilator. Tolerates room air without anxiety.   Problem: Respiratory: Goal: Ability to maintain a clear airway and adequate ventilation will improve Outcome: Completed/Met Date Met: 04/12/17 Patient on room air and no longer requires ventilator.   Problem: Role Relationship: Goal: Method of communication will improve Outcome: Completed/Met Date Met: 04/12/17 Patient has worked with SLP and able to communicate with trach capped. Patient eating Dysphagia 2 Nectar thick diet.

## 2017-04-12 NOTE — Progress Notes (Signed)
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   Admitted on 02/14/17 to [facility] with left foot/leg pain, wound dehiscence and purulent drainage from the left trans-metatarsal amputation site.   She was subsequently found to be unresponsive on 5/19. Noted to bein asystole and CPR started with ROSC in approximately 9 minutes. She was intubated by anaesthesia, transferred to the intensive care unit and subsequently underwent cooling protocol. She was unable to be liberated off the ventilator and as a result underwent tracheostomy on 5/31.  Neurological/ Cognitive status has been poor. She has been evaluated by neurology on 5/29 and, per neurology, chances of meaningful neurological recovery appeared to be dismal.  Hospital course complicated by persistent leukocytosis, respiratory failure and left transmetatarsal wound dehiscence with gangrene. 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  She has subsequently had recurrent aspiration pneumonias.  CXR on 6/29 showed a RLL infiltrate.  Subjective: Sleepy this AM. Did not talk to me. Mother at bedside has no concerns.   Assessment & Plan: Acute /chronichypoxemic vent dependent respiratory failure in a setting of cardiac arrest Aspiration pneumonia- RLL  Tracheostomy  - Intubated 6/19-6/20.  5/31- tracheostomy- - PCCM following  - S/P MBS 6/23. Now status post FEES 6/27. Patient started on nectar thick liquids and dysphagia 1 diet 6/27. Holding off on PEG   - has had aspiration issues- recurrent aspiration on 6/28-  Repeat chest x-ray on 6/29 showed new right-sided infiltrate - She was started on  Fortaz on 6/29 and changed over to [place] on 6/30   - Chest x-ray from 7/1 showed improving opacity in the right lung. - has completed 14 days of Zosyn  - has been using passy-muir valve while awake and was doing well with it   ESRD On HD TTS  per Nephrology   Sinus tachycardia - likely due to severe deconditioning -  Resumed Coreg 3.125 BID on non-dialysis days-  increase to 6.25 today as BP rising   Left trans-metatarsal stump wound dehiscence with infection and gangrene - underwent AKA 6/15 - Per operative note, pus/abscess noted extending up to the knee when BKA was attempted and she subsequently underwent a AKA. Note, initially family was very reluctant to proceed with amputation, but given persistent leukocytosis/fever the family agreed for amputation - Orthopedics removed wound VAC 6/20 - revision of her left AKA  04/03/17   - currently with wound vac   Right heel eschar - wound care RN following - Prevalon boot  Ulcers on sacral area- incontinence associated dermatitis - WOC >> Cleanse and apply Purple Top Criticaid Cream BID and PRN incontinence of urine or stool - also applying barrier cream - air mattress    Diabetes mellitus 2 - cont Lantus 8 U and SSI with meals  Thrush - started Nystatin and Diflucan 7/11- continue until 7/17   Anoxic brain injury secondary to Cardiac arrest on 5/19: - slow cognitive improvement- following commands, talking & feeding herself now  Diarrhea  - resolved    Normocytic anemia Likely secondary to chronic disease-probably worsened by acute illness. No signs of bleeding, hemoglobin has been stable with some fluctuations.   Diabetes mellitus type 2  Continue with SSI. On Lantus.  HbA1c 5.9.   Chronic systolic heart failure/nonischemic cardiomyopathy - Echocardiogram>> EF 25-30% with diffuse hypokinesis - volume management with dialysis - has been unable to tolerate Coreg due to hypotension  - resumed as BP  rising  Hypotension/ HTN - Midodrine with dialysis to be discontinued today due to resolution of hypotension - using Coreg now    Pericardial effusion Seen on TEE- no evidence of tamponade pathophysiology. Repeat echo 6/26 with no significant pericardial effusion.   Moderate protein calorie malnutrition - taking orals well now- cont Prostat  Depression?  - has been on Cymbalta at home which is currently being held- will resume    DVT prophylaxis: Heparin Code Status: Full code Family Communication: mother  Disposition Plan: to be determined Consultants:  Nephrology, pulmonary, palliative care, ID, Critical care, Ortho Procedures:  Transthoracic echocardiogram 6/28 Study Conclusions  - Left ventricle: The cavity size was normal. There was mild concentric hypertrophy. Systolic function was severely reduced. The estimated ejection fraction was in the range of 25% to 30%. Diffuse hypokinesis. Regional wall motion abnormalities cannot be excluded. - Mitral valve: There was mild regurgitation. - Pericardium, extracardiac: A trivial pericardial effusion was identified circumferential to the heart.  Left above-knee amputation. 6/15  Antimicrobials:  Anti-infectives    Start     Dose/Rate Route Frequency Ordered Stop   04/08/17 1000  fluconazole (DIFLUCAN) IVPB 100 mg     100 mg 50 mL/hr over 60 Minutes Intravenous Every 24 hours 04/08/17 0851     04/04/17 0600  ceFAZolin (ANCEF) IVPB 2g/100 mL premix  Status:  Discontinued     2 g 200 mL/hr over 30 Minutes Intravenous On call to O.R. 04/03/17 1840 04/03/17 1848   03/28/17 1600  cefTAZidime (FORTAZ) 1 g in dextrose 5 % 50 mL IVPB  Status:  Discontinued     1 g 100 mL/hr over 30 Minutes Intravenous Every 24 hours 03/27/17 0814 03/28/17 0752   03/28/17 1100  piperacillin-tazobactam (ZOSYN) IVPB 3.375 g     3.375 g 12.5 mL/hr over 240 Minutes Intravenous Every 12 hours 03/28/17 1038 04/11/17 1359   03/27/17  0815  cefTAZidime (FORTAZ) 1 g in dextrose 5 % 50 mL IVPB     1 g 100 mL/hr over 30 Minutes Intravenous  Once 03/27/17 0814 03/27/17 1026   03/17/17 1800  cefTAZidime (FORTAZ) 2 g in dextrose 5 % 50 mL IVPB  Status:  Discontinued     2 g 100 mL/hr over 30 Minutes Intravenous Every T-Th-Sa (1800) 03/15/17 1143 03/18/17 1223   03/17/17 1200  vancomycin (VANCOCIN) IVPB 1000 mg/200 mL premix  Status:  Discontinued     1,000 mg 200 mL/hr over 60 Minutes Intravenous Every T-Th-Sa (Hemodialysis) 03/15/17 1143 03/18/17 1223   03/14/17 1200  vancomycin (VANCOCIN) IVPB 1000 mg/200 mL premix  Status:  Discontinued     1,000 mg 200 mL/hr over 60 Minutes Intravenous Every T-Th-Sa (Hemodialysis) 03/12/17 0912 03/14/17 1542   03/13/17 1430  ceFAZolin (ANCEF) IVPB 2g/100 mL premix  Status:  Discontinued     2 g 200 mL/hr over 30 Minutes Intravenous To ShortStay Surgical 03/12/17 1208 03/13/17 1113   03/13/17 1115  ceFAZolin (ANCEF) IVPB 2g/100 mL premix     2 g 200 mL/hr over 30 Minutes Intravenous To Surgery 03/13/17 1108 03/13/17 1506   03/12/17 1800  cefTAZidime (FORTAZ) 2 g in dextrose 5 % 50 mL IVPB  Status:  Discontinued     2 g 100 mL/hr over 30 Minutes Intravenous Every T-Th-Sa (  1800) 03/12/17 0912 03/14/17 1542   03/12/17 1200  vancomycin (VANCOCIN) 1,500 mg in sodium chloride 0.9 % 250 mL IVPB     1,500 mg 250 mL/hr over 60 Minutes Intravenous Every Thu (Hemodialysis) 03/12/17 0912 03/12/17 1356   03/02/17 1200  cefTRIAXone (ROCEPHIN) 2 g in dextrose 5 % 50 mL IVPB     2 g 100 mL/hr over 30 Minutes Intravenous Every 24 hours 03/02/17 0810 03/06/17 1358   03/02/17 1200  vancomycin (VANCOCIN) IVPB 1000 mg/200 mL premix     1,000 mg 200 mL/hr over 60 Minutes Intravenous Every M-W-F (Hemodialysis) 03/02/17 0815 03/02/17 1457   03/02/17 1200  metroNIDAZOLE (FLAGYL) IVPB 500 mg     500 mg 100 mL/hr over 60 Minutes Intravenous Every 8 hours 03/02/17 0948 03/06/17 2230   02/26/17 1200  vancomycin  (VANCOCIN) IVPB 1000 mg/200 mL premix     1,000 mg 200 mL/hr over 60 Minutes Intravenous Every T-Th-Sa (Hemodialysis) 02/25/17 1151 03/05/17 1325   02/25/17 1200  vancomycin (VANCOCIN) 2,000 mg in sodium chloride 0.9 % 500 mL IVPB     2,000 mg 250 mL/hr over 120 Minutes Intravenous  Once 02/25/17 1149 02/25/17 1449   02/25/17 0900  cefTRIAXone (ROCEPHIN) 2 g in dextrose 5 % 50 mL IVPB  Status:  Discontinued     2 g 100 mL/hr over 30 Minutes Intravenous Every 24 hours 02/25/17 0814 03/02/17 0810   02/25/17 0900  metroNIDAZOLE (FLAGYL) IVPB 500 mg  Status:  Discontinued     500 mg 100 mL/hr over 60 Minutes Intravenous Every 8 hours 02/25/17 0814 03/02/17 0948   02/20/17 1200  vancomycin (VANCOCIN) IVPB 1000 mg/200 mL premix  Status:  Discontinued     1,000 mg 200 mL/hr over 60 Minutes Intravenous Every M-W-F (Hemodialysis) 02/19/17 1506 02/20/17 1403   02/17/17 1800  meropenem (MERREM) 500 mg in sodium chloride 0.9 % 50 mL IVPB  Status:  Discontinued     500 mg 100 mL/hr over 30 Minutes Intravenous Daily-1800 02/17/17 1026 02/25/17 0814   02/17/17 1200  vancomycin (VANCOCIN) IVPB 1000 mg/200 mL premix     1,000 mg 200 mL/hr over 60 Minutes Intravenous Every T-Th-Sa (Hemodialysis) 02/14/17 1758 02/19/17 1612   02/14/17 2200  clindamycin (CLEOCIN) IVPB 900 mg  Status:  Discontinued     900 mg 100 mL/hr over 30 Minutes Intravenous Every 8 hours 02/14/17 1106 02/14/17 1928   02/14/17 2000  clindamycin (CLEOCIN) IVPB 900 mg  Status:  Discontinued     900 mg 100 mL/hr over 30 Minutes Intravenous Every 8 hours 02/14/17 1928 02/16/17 1047   02/14/17 1915  piperacillin-tazobactam (ZOSYN) IVPB 3.375 g  Status:  Discontinued     3.375 g 100 mL/hr over 30 Minutes Intravenous Every 12 hours 02/14/17 1801 02/17/17 1026   02/14/17 1845  vancomycin (VANCOCIN) 2,000 mg in sodium chloride 0.9 % 500 mL IVPB     2,000 mg 250 mL/hr over 120 Minutes Intravenous  Once 02/14/17 1758 02/14/17 2107   02/14/17  1115  clindamycin (CLEOCIN) IVPB 900 mg  Status:  Discontinued     900 mg 100 mL/hr over 30 Minutes Intravenous  Once 02/14/17 1106 02/14/17 2236   02/14/17 0945  clindamycin (CLEOCIN) IVPB 600 mg     600 mg 100 mL/hr over 30 Minutes Intravenous  Once 02/14/17 0931 02/14/17 1013       Objective: Vitals:   04/12/17 0821 04/12/17 0851 04/12/17 0900 04/12/17 1218  BP: (!) 140/94  136/88 96/60  Pulse: (!) 129   (!) 113  Resp: (!) 31     Temp: 98.1 F (36.7 C)   (!) 97.3 F (36.3 C)  TempSrc: Axillary   Axillary  SpO2:  97% 100% 95%  Weight:      Height:        Intake/Output Summary (Last 24 hours) at 04/12/17 1351 Last data filed at 04/12/17 1000  Gross per 24 hour  Intake               50 ml  Output             1000 ml  Net             -950 ml   Filed Weights   04/11/17 0417 04/12/17 0034 04/12/17 0500  Weight: 78 kg (171 lb 15.3 oz) 80 kg (176 lb 5.9 oz) 79 kg (174 lb 2.6 oz)    Examination: General exam: Appears comfortable -  HEENT: PERRLA, oral mucosa moist, thrush improving Respiratory system: Clear to auscultation. Respiratory effort normal. Cardiovascular system: S1 & S2 heard, RRR.  No murmurs  Gastrointestinal system: Abdomen soft, non-tender, nondistended. Normal bowel sound. No organomegaly Central nervous system: Alert - moving arms and legs - normal tone in extermities Extremities: No cyanosis, clubbing or edema- left AKA with wound vac, prior right transmetatarsal amputation Skin: multiple ulcers on sacral area- no cellulitis- right heel eschar   Data Reviewed: I have personally reviewed following labs and imaging studies  CBC:  Recent Labs Lab 04/06/17 0443 04/07/17 0427 04/08/17 0333 04/09/17 0429 04/11/17 0341  WBC 19.7* 16.4* 13.4* 11.7* 10.1  HGB 7.8* 7.3* 7.9* 8.1* 8.3*  HCT 27.6* 25.1* 28.2* 28.3* 29.0*  MCV 99.3 97.7 98.9 97.9 98.0  PLT 457* 423* 472* 469* 720   Basic Metabolic Panel:  Recent Labs Lab 04/06/17 0443 04/07/17 0427  04/07/17 1415 04/08/17 0333 04/09/17 0429 04/11/17 0341  NA 137 137 135 137 138 135  K 4.7 4.8 4.2 4.5 4.6 3.9  CL 95* 94* 96* 97* 97* 94*  CO2 26 27 28 27 27 28   GLUCOSE 109* 120* 113* 99 110* 104*  BUN 32* 53* 18 29* 48* 33*  CREATININE 4.48* 5.63* 2.60* 3.61* 5.16* 4.12*  CALCIUM 8.7* 8.7* 8.6* 8.7* 9.0 9.2  PHOS 5.6* 6.0*  --   --   --   --    GFR: Estimated Creatinine Clearance: 20.9 mL/min (A) (by C-G formula based on SCr of 4.12 mg/dL (H)). Liver Function Tests:  Recent Labs Lab 04/06/17 0443 04/07/17 0427 04/07/17 1415 04/08/17 0333  AST  --   --  28 19  ALT  --   --  8* 12*  ALKPHOS  --   --  194* 226*  BILITOT  --   --  1.0 1.1  PROT  --   --  6.5 6.9  ALBUMIN 2.3* 2.1* 2.2* 2.2*   No results for input(s): LIPASE, AMYLASE in the last 168 hours. No results for input(s): AMMONIA in the last 168 hours. Coagulation Profile: No results for input(s): INR, PROTIME in the last 168 hours. Cardiac Enzymes: No results for input(s): CKTOTAL, CKMB, CKMBINDEX, TROPONINI in the last 168 hours. BNP (last 3 results) No results for input(s): PROBNP in the last 8760 hours. HbA1C: No results for input(s): HGBA1C in the last 72 hours. CBG:  Recent Labs Lab 04/11/17 0746 04/11/17 1821 04/11/17 2119 04/12/17 0852 04/12/17 1247  GLUCAP 105* 105* 141* 128* 113*   Lipid Profile:  No results for input(s): CHOL, HDL, LDLCALC, TRIG, CHOLHDL, LDLDIRECT in the last 72 hours. Thyroid Function Tests: No results for input(s): TSH, T4TOTAL, FREET4, T3FREE, THYROIDAB in the last 72 hours. Anemia Panel: No results for input(s): VITAMINB12, FOLATE, FERRITIN, TIBC, IRON, RETICCTPCT in the last 72 hours. Urine analysis:    Component Value Date/Time   COLORURINE AMBER (A) 02/14/2017 2111   APPEARANCEUR HAZY (A) 02/14/2017 2111   LABSPEC 1.015 02/14/2017 2111   PHURINE 5.0 02/14/2017 2111   GLUCOSEU NEGATIVE 02/14/2017 2111   HGBUR SMALL (A) 02/14/2017 2111   BILIRUBINUR NEGATIVE  02/14/2017 2111   [place] 02/14/2017 2111   PROTEINUR 30 (A) 02/14/2017 2111   UROBILINOGEN 0.2 09/21/2014 1908   NITRITE NEGATIVE 02/14/2017 2111   LEUKOCYTESUR TRACE (A) 02/14/2017 2111   Sepsis Labs: @LABRCNTIP (procalcitonin:4,lacticidven:4) )No results found for this or any previous visit (from the past 240 hour(s)).       Radiology Studies: No results found.    Scheduled Meds: . carvedilol  3.125 mg Oral BID WC  . chlorhexidine gluconate (MEDLINE KIT)  15 mL Mouth Rinse BID  . darbepoetin (ARANESP) injection - DIALYSIS  200 mcg Intravenous Q Thu-HD  . dextrose  1 ampule Intravenous Once  . docusate sodium  100 mg Oral BID  . DULoxetine  30 mg Oral Daily  . feeding supplement (PRO-STAT SUGAR FREE 64)  30 mL Oral TID  . [name]'s butt cream   Topical QID  . heparin  5,000 Units Subcutaneous Q8H  . insulin aspart  0-15 Units Subcutaneous TID WC  . insulin glargine  8 Units Subcutaneous Daily  . mouth rinse  15 mL Mouth Rinse QID  . multivitamin  1 tablet Oral QHS  . nystatin  5 mL Oral QID   Continuous Infusions: . fluconazole (DIFLUCAN) IV Stopped (04/12/17 1100)     LOS: 57 days    Time spent in minutes: 35    Debbe Odea, MD [facility] Pager: [email or web address] Password [email or web address] 04/12/2017, 1:51 PM

## 2017-04-13 DIAGNOSIS — T17908S Unspecified foreign body in respiratory tract, part unspecified causing other injury, sequela: Secondary | ICD-10-CM

## 2017-04-13 LAB — RENAL FUNCTION PANEL
Albumin: 2.5 g/dL — ABNORMAL LOW (ref 3.5–5.0)
Anion gap: 13 (ref 5–15)
BUN: 36 mg/dL — AB (ref 6–20)
CHLORIDE: 99 mmol/L — AB (ref 101–111)
CO2: 27 mmol/L (ref 22–32)
CREATININE: 4.27 mg/dL — AB (ref 0.44–1.00)
Calcium: 9.4 mg/dL (ref 8.9–10.3)
GFR calc Af Amer: 14 mL/min — ABNORMAL LOW (ref >60)
GFR, EST NON AFRICAN AMERICAN: 12 mL/min — AB (ref >60)
Glucose, Bld: 110 mg/dL — ABNORMAL HIGH (ref 65–99)
Phosphorus: 4.6 mg/dL (ref 2.5–4.6)
Potassium: 4.5 mmol/L (ref 3.5–5.1)
SODIUM: 139 mmol/L (ref 135–145)

## 2017-04-13 LAB — CBC
HCT: 32.5 % — ABNORMAL LOW (ref 36.0–46.0)
Hemoglobin: 9.1 g/dL — ABNORMAL LOW (ref 12.0–15.0)
MCH: 28.6 pg (ref 26.0–34.0)
MCHC: 28 g/dL — ABNORMAL LOW (ref 30.0–36.0)
MCV: 102.2 fL — AB (ref 78.0–100.0)
PLATELETS: 434 10*3/uL — AB (ref 150–400)
RBC: 3.18 MIL/uL — ABNORMAL LOW (ref 3.87–5.11)
RDW: 18.7 % — AB (ref 11.5–15.5)
WBC: 9.7 10*3/uL (ref 4.0–10.5)

## 2017-04-13 LAB — GLUCOSE, CAPILLARY
GLUCOSE-CAPILLARY: 222 mg/dL — AB (ref 65–99)
Glucose-Capillary: 116 mg/dL — ABNORMAL HIGH (ref 65–99)
Glucose-Capillary: 73 mg/dL (ref 65–99)

## 2017-04-13 MED ORDER — SODIUM CHLORIDE 0.9 % IV SOLN: 100.0000 mL | INTRAVENOUS | Status: DC | PRN

## 2017-04-13 MED ORDER — LIDOCAINE HCL (PF) 1 % IJ SOLN: 5.0000 mL | INTRAMUSCULAR | Status: DC | PRN

## 2017-04-13 MED ORDER — PENTAFLUOROPROP-TETRAFLUOROETH EX AERO: 1.0000 "application " | INHALATION_SPRAY | CUTANEOUS | Status: DC | PRN

## 2017-04-13 MED ORDER — HEPARIN SODIUM (PORCINE) 1000 UNIT/ML DIALYSIS
1000.0000 [IU] | INTRAMUSCULAR | Status: DC | PRN
  Filled 2017-04-13: qty 1

## 2017-04-13 MED ORDER — LIDOCAINE-PRILOCAINE 2.5-2.5 % EX CREA
1.0000 "application " | TOPICAL_CREAM | CUTANEOUS | Status: DC | PRN
  Filled 2017-04-13: qty 5

## 2017-04-13 MED ORDER — ALTEPLASE 2 MG IJ SOLR
2.0000 mg | Freq: Once | INTRAMUSCULAR | Status: DC | PRN
  Filled 2017-04-13: qty 2

## 2017-04-13 NOTE — Progress Notes (Signed)
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   Admitted on 02/14/17 to [facility] with left foot/leg pain, wound dehiscence and purulent drainage from the left trans-metatarsal amputation site.   She was subsequently found to be unresponsive on 5/19. Noted to bein asystole and CPR started with ROSC in approximately 9 minutes. She was intubated by anaesthesia, transferred to the intensive care unit and subsequently underwent cooling protocol. She was unable to be liberated off the ventilator and as a result underwent tracheostomy on 5/31.  Neurological/ Cognitive status has been poor. She has been evaluated by neurology on 5/29 and, per neurology, chances of meaningful neurological recovery appeared to be dismal.  Hospital course complicated by persistent leukocytosis, respiratory failure and left transmetatarsal wound dehiscence with gangrene. 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  She has subsequently had recurrent aspiration pneumonias.  CXR on 6/29 showed a RLL infiltrate.  Subjective: Sleepy again this morning. Did not talk to me.   Assessment & Plan: Acute /chronichypoxemic vent dependent respiratory failure in a setting of cardiac arrest Aspiration pneumonia- RLL  Tracheostomy  - Intubated 6/19-6/20.  5/31- tracheostomy- - PCCM following  - S/P MBS 6/23. Now status post FEES 6/27. Patient started on nectar thick liquids and dysphagia 1 diet 6/27. Holding off on PEG   - has had aspiration issues- recurrent aspiration on 6/28-  Repeat chest x-ray on 6/29 showed new right-sided infiltrate - She was started on Fortaz on 6/29 and changed  over to [place] on 6/30   - Chest x-ray from 7/1 showed improving opacity in the right lung. 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  - Orthopedics removed wound VAC 6/20 - revision of her left AKA  04/03/17   - currently with wound vac - have notified Dr Sharol Given to follow  Right heel eschar - wound care RN following - Prevalon boot  Ulcers on sacral area- incontinence associated dermatitis - WOC >> Cleanse and apply Purple Top Criticaid Cream BID and PRN incontinence of urine or stool - also applying barrier cream - air mattress    Diabetes mellitus 2 - cont Lantus 8 U and SSI with meals  Thrush - started Nystatin and Diflucan 7/11- continue until 7/17   Anoxic brain injury secondary to Cardiac arrest on 5/19: - slow cognitive improvement- following commands, talking & feeding herself now  Diarrhea  - resolved    Normocytic anemia Likely secondary to chronic disease-probably worsened by acute illness. No signs of bleeding, hemoglobin has been stable with some fluctuations.  Diabetes mellitus type 2  Continue with SSI. On Lantus. HbA1c 5.9.   Chronic systolic heart failure/nonischemic cardiomyopathy - Echocardiogram>> EF 25-30% with diffuse hypokinesis - volume management with dialysis - has been unable to tolerate Coreg due  to hypotension  - resumed as BP rising  Hypotension/ HTN - Midodrine with dialysis to be discontinued today due to resolution of hypotension - using Coreg now    Pericardial effusion Seen on TEE- no evidence of tamponade pathophysiology. Repeat echo 6/26 with no significant pericardial effusion.   Moderate protein calorie malnutrition - taking orals well now- cont Prostat  Depression?  - has been on Cymbalta at home which is currently being held- will resume    DVT prophylaxis: Heparin Code Status: Full code Family Communication: mother  Disposition Plan: to be determined- difficult to place in facility due to [facility] and dialysis Consultants:  Nephrology, pulmonary, palliative care, ID, Critical care, Ortho Procedures:  Transthoracic echocardiogram 6/28 Study Conclusions  - Left ventricle: The cavity size was normal. There was mild concentric hypertrophy. Systolic function was severely reduced. The estimated ejection fraction was in the range of 25% to 30%. Diffuse hypokinesis. Regional wall motion abnormalities cannot be excluded. - Mitral valve: There was mild regurgitation. - Pericardium, extracardiac: A trivial pericardial effusion was identified circumferential to the heart.  Left above-knee amputation. 6/15  Antimicrobials:  Anti-infectives    Start     Dose/Rate Route Frequency Ordered Stop   04/13/17 1000  fluconazole (DIFLUCAN) tablet 100 mg     100 mg Oral Daily 04/12/17 1356 04/15/17 0959   04/08/17 1000  fluconazole (DIFLUCAN) IVPB 100 mg  Status:  Discontinued     100 mg 50 mL/hr over 60 Minutes Intravenous Every 24 hours 04/08/17 0851 04/12/17 1356   04/04/17 0600  ceFAZolin (ANCEF) IVPB 2g/100 mL premix  Status:  Discontinued     2 g 200 mL/hr over 30 Minutes Intravenous On call to O.R. 04/03/17 1840 04/03/17 1848   03/28/17 1600  cefTAZidime (FORTAZ) 1 g in dextrose 5 % 50 mL IVPB  Status:  Discontinued     1 g 100 mL/hr over 30 Minutes  Intravenous Every 24 hours 03/27/17 0814 03/28/17 0752   03/28/17 1100  piperacillin-tazobactam (ZOSYN) IVPB 3.375 g     3.375 g 12.5 mL/hr over 240 Minutes Intravenous Every 12 hours 03/28/17 1038 04/11/17 1359   03/27/17 0815  cefTAZidime (FORTAZ) 1 g in dextrose 5 % 50 mL IVPB     1 g 100 mL/hr over 30 Minutes Intravenous  Once 03/27/17 0814 03/27/17 1026   03/17/17 1800  cefTAZidime (FORTAZ) 2 g in dextrose 5 % 50 mL IVPB  Status:  Discontinued     2 g 100 mL/hr over 30 Minutes Intravenous Every T-Th-Sa (1800) 03/15/17 1143 03/18/17 1223   03/17/17 1200  vancomycin (VANCOCIN) IVPB 1000 mg/200 mL premix  Status:  Discontinued     1,000 mg 200 mL/hr over 60 Minutes Intravenous Every T-Th-Sa (Hemodialysis) 03/15/17 1143 03/18/17 1223   03/14/17 1200  vancomycin (VANCOCIN) IVPB 1000 mg/200 mL premix  Status:  Discontinued     1,000 mg 200 mL/hr over 60 Minutes Intravenous Every T-Th-Sa (Hemodialysis) 03/12/17 0912 03/14/17 1542   03/13/17 1430  ceFAZolin (ANCEF) IVPB 2g/100 mL premix  Status:  Discontinued     2 g 200 mL/hr over 30 Minutes Intravenous To ShortStay Surgical 03/12/17 1208 03/13/17 1113   03/13/17 1115  ceFAZolin (ANCEF) IVPB 2g/100 mL premix     2 g  200 mL/hr over 30 Minutes Intravenous To Surgery 03/13/17 1108 03/13/17 1506   03/12/17 1800  cefTAZidime (FORTAZ) 2 g in dextrose 5 % 50 mL IVPB  Status:  Discontinued     2 g 100 mL/hr over 30 Minutes Intravenous Every T-Th-Sa (1800) 03/12/17 0912 03/14/17 1542   03/12/17 1200  vancomycin (VANCOCIN) 1,500 mg in sodium chloride 0.9 % 250 mL IVPB     1,500 mg 250 mL/hr over 60 Minutes Intravenous Every Thu (Hemodialysis) 03/12/17 0912 03/12/17 1356   03/02/17 1200  cefTRIAXone (ROCEPHIN) 2 g in dextrose 5 % 50 mL IVPB     2 g 100 mL/hr over 30 Minutes Intravenous Every 24 hours 03/02/17 0810 03/06/17 1358   03/02/17 1200  vancomycin (VANCOCIN) IVPB 1000 mg/200 mL premix     1,000 mg 200 mL/hr over 60 Minutes Intravenous  Every M-W-F (Hemodialysis) 03/02/17 0815 03/02/17 1457   03/02/17 1200  metroNIDAZOLE (FLAGYL) IVPB 500 mg     500 mg 100 mL/hr over 60 Minutes Intravenous Every 8 hours 03/02/17 0948 03/06/17 2230   02/26/17 1200  vancomycin (VANCOCIN) IVPB 1000 mg/200 mL premix     1,000 mg 200 mL/hr over 60 Minutes Intravenous Every T-Th-Sa (Hemodialysis) 02/25/17 1151 03/05/17 1325   02/25/17 1200  vancomycin (VANCOCIN) 2,000 mg in sodium chloride 0.9 % 500 mL IVPB     2,000 mg 250 mL/hr over 120 Minutes Intravenous  Once 02/25/17 1149 02/25/17 1449   02/25/17 0900  cefTRIAXone (ROCEPHIN) 2 g in dextrose 5 % 50 mL IVPB  Status:  Discontinued     2 g 100 mL/hr over 30 Minutes Intravenous Every 24 hours 02/25/17 0814 03/02/17 0810   02/25/17 0900  metroNIDAZOLE (FLAGYL) IVPB 500 mg  Status:  Discontinued     500 mg 100 mL/hr over 60 Minutes Intravenous Every 8 hours 02/25/17 0814 03/02/17 0948   02/20/17 1200  vancomycin (VANCOCIN) IVPB 1000 mg/200 mL premix  Status:  Discontinued     1,000 mg 200 mL/hr over 60 Minutes Intravenous Every M-W-F (Hemodialysis) 02/19/17 1506 02/20/17 1403   02/17/17 1800  meropenem (MERREM) 500 mg in sodium chloride 0.9 % 50 mL IVPB  Status:  Discontinued     500 mg 100 mL/hr over 30 Minutes Intravenous Daily-1800 02/17/17 1026 02/25/17 0814   02/17/17 1200  vancomycin (VANCOCIN) IVPB 1000 mg/200 mL premix     1,000 mg 200 mL/hr over 60 Minutes Intravenous Every T-Th-Sa (Hemodialysis) 02/14/17 1758 02/19/17 1612   02/14/17 2200  clindamycin (CLEOCIN) IVPB 900 mg  Status:  Discontinued     900 mg 100 mL/hr over 30 Minutes Intravenous Every 8 hours 02/14/17 1106 02/14/17 1928   02/14/17 2000  clindamycin (CLEOCIN) IVPB 900 mg  Status:  Discontinued     900 mg 100 mL/hr over 30 Minutes Intravenous Every 8 hours 02/14/17 1928 02/16/17 1047   02/14/17 1915  piperacillin-tazobactam (ZOSYN) IVPB 3.375 g  Status:  Discontinued     3.375 g 100 mL/hr over 30 Minutes Intravenous  Every 12 hours 02/14/17 1801 02/17/17 1026   02/14/17 1845  vancomycin (VANCOCIN) 2,000 mg in sodium chloride 0.9 % 500 mL IVPB     2,000 mg 250 mL/hr over 120 Minutes Intravenous  Once 02/14/17 1758 02/14/17 2107   02/14/17 1115  clindamycin (CLEOCIN) IVPB 900 mg  Status:  Discontinued     900 mg 100 mL/hr over 30 Minutes Intravenous  Once 02/14/17 1106 02/14/17 2236   02/14/17 0945  clindamycin (CLEOCIN) IVPB  600 mg     600 mg 100 mL/hr over 30 Minutes Intravenous  Once 02/14/17 0931 02/14/17 1013       Objective: Vitals:   04/13/17 0731 04/13/17 0754 04/13/17 0927 04/13/17 1100  BP:  119/90 112/78   Pulse:  (!) 117 (!) 121   Resp: 15   11  Temp:  (!) 97.1 F (36.2 C)    TempSrc:  Axillary    SpO2: 98% 97%  98%  Weight:      Height:        Intake/Output Summary (Last 24 hours) at 04/13/17 1132 Last data filed at 04/13/17 0820  Gross per 24 hour  Intake                0 ml  Output               50 ml  Net              -50 ml   Filed Weights   04/12/17 0034 04/12/17 0500 04/13/17 0427  Weight: 80 kg (176 lb 5.9 oz) 79 kg (174 lb 2.6 oz) 79 kg (174 lb 2.6 oz)    Examination: General exam: Appears comfortable -  HEENT: PERRLA, oral mucosa moist, will not open mouth today Respiratory system: Clear to auscultation. Respiratory effort normal. Cardiovascular system: S1 & S2 heard, RRR.  No murmurs tachycardic Gastrointestinal system: Abdomen soft, non-tender, nondistended. Normal bowel sound. No organomegaly Central nervous system: Alert - moving arms and legs - normal tone in extermities Extremities: No cyanosis, clubbing or edema- left AKA with wound vac, prior right transmetatarsal amputation Skin: multiple ulcers on sacral area- no cellulitis- right heel eschar   Data Reviewed: I have personally reviewed following labs and imaging studies  CBC:  Recent Labs Lab 04/07/17 0427 04/08/17 0333 04/09/17 0429 04/11/17 0341  WBC 16.4* 13.4* 11.7* 10.1  HGB 7.3* 7.9*  8.1* 8.3*  HCT 25.1* 28.2* 28.3* 29.0*  MCV 97.7 98.9 97.9 98.0  PLT 423* 472* 469* 976   Basic Metabolic Panel:  Recent Labs Lab 04/07/17 0427 04/07/17 1415 04/08/17 0333 04/09/17 0429 04/11/17 0341  NA 137 135 137 138 135  K 4.8 4.2 4.5 4.6 3.9  CL 94* 96* 97* 97* 94*  CO2 27 28 27 27 28   GLUCOSE 120* 113* 99 110* 104*  BUN 53* 18 29* 48* 33*  CREATININE 5.63* 2.60* 3.61* 5.16* 4.12*  CALCIUM 8.7* 8.6* 8.7* 9.0 9.2  PHOS 6.0*  --   --   --   --    GFR: Estimated Creatinine Clearance: 20.9 mL/min (A) (by C-G formula based on SCr of 4.12 mg/dL (H)). Liver Function Tests:  Recent Labs Lab 04/07/17 0427 04/07/17 1415 04/08/17 0333  AST  --  28 19  ALT  --  8* 12*  ALKPHOS  --  194* 226*  BILITOT  --  1.0 1.1  PROT  --  6.5 6.9  ALBUMIN 2.1* 2.2* 2.2*   No results for input(s): LIPASE, AMYLASE in the last 168 hours. No results for input(s): AMMONIA in the last 168 hours. Coagulation Profile: No results for input(s): INR, PROTIME in the last 168 hours. Cardiac Enzymes: No results for input(s): CKTOTAL, CKMB, CKMBINDEX, TROPONINI in the last 168 hours. BNP (last 3 results) No results for input(s): PROBNP in the last 8760 hours. HbA1C: No results for input(s): HGBA1C in the last 72 hours. CBG:  Recent Labs Lab 04/12/17 0852 04/12/17 1247 04/12/17 1618 04/12/17 2122 04/13/17  0753  GLUCAP 128* 113* 165* 135* 116*   Lipid Profile: No results for input(s): CHOL, HDL, LDLCALC, TRIG, CHOLHDL, LDLDIRECT in the last 72 hours. Thyroid Function Tests: No results for input(s): TSH, T4TOTAL, FREET4, T3FREE, THYROIDAB in the last 72 hours. Anemia Panel: No results for input(s): VITAMINB12, FOLATE, FERRITIN, TIBC, IRON, RETICCTPCT in the last 72 hours. Urine analysis:    Component Value Date/Time   COLORURINE AMBER (A) 02/14/2017 2111   APPEARANCEUR HAZY (A) 02/14/2017 2111   LABSPEC 1.015 02/14/2017 2111   PHURINE 5.0 02/14/2017 2111   GLUCOSEU NEGATIVE  02/14/2017 2111   HGBUR SMALL (A) 02/14/2017 2111   BILIRUBINUR NEGATIVE 02/14/2017 2111   KETONESUR NEGATIVE 02/14/2017 2111   PROTEINUR 30 (A) 02/14/2017 2111   UROBILINOGEN 0.2 09/21/2014 1908   NITRITE NEGATIVE 02/14/2017 2111   LEUKOCYTESUR TRACE (A) 02/14/2017 2111   Sepsis Labs: @LABRCNTIP (procalcitonin:4,lacticidven:4) )No results found for this or any previous visit (from the past 240 hour(s)).       Radiology Studies: No results found.    Scheduled Meds: . carvedilol  6.25 mg Oral BID WC  . chlorhexidine gluconate (MEDLINE KIT)  15 mL Mouth Rinse BID  . darbepoetin (ARANESP) injection - DIALYSIS  200 mcg Intravenous Q Thu-HD  . dextrose  1 ampule Intravenous Once  . docusate sodium  100 mg Oral BID  . DULoxetine  30 mg Oral Daily  . feeding supplement (PRO-STAT SUGAR FREE 64)  30 mL Oral TID  . fluconazole  100 mg Oral Daily  . [name]'s butt cream   Topical QID  . heparin  5,000 Units Subcutaneous Q8H  . insulin aspart  0-15 Units Subcutaneous TID WC  . insulin glargine  8 Units Subcutaneous Daily  . mouth rinse  15 mL Mouth Rinse QID  . multivitamin  1 tablet Oral QHS  . nystatin  5 mL Oral QID   Continuous Infusions:    LOS: 58 days    Time spent in minutes: 35    Debbe Odea, MD [facility] Pager: [email or web address] Password [facility] 04/13/2017, 11:32 AM

## 2017-04-13 NOTE — Progress Notes (Signed)
Trach de-cannulated per Dr. Reginia Naas order, patient tolerated well, SATS remained at 100%, RR 16 bpm, will continue to monitor patient.

## 2017-04-13 NOTE — Progress Notes (Signed)
PULMONARY / CRITICAL CARE MEDICINE   Name: [name] MRN: [id number] DOB: 1981-05-01    ADMISSION DATE:  02/14/2017 CONSULTATION DATE:  02/14/2017  REFERRING MD:  Feliz Beam MD  CHIEF COMPLAINT:  Sepsis, cardiac arrest  BRIEF SUMMARY:   36 year old female w/ ESRD d/t poorly controlled DM, bilateral transmetatarsal foot amputations, obesity, HTN, medical non-compliance as well as NICM. Admitted 5/19 with complaints of left leg pain, found to have radiological evidence of osteomyelitis with wound nonhealing, dehiscence, purulent drainage from the wound. Admitted for IV antibiotics, hemodialysis and possible surgery.  Found unresponsive in the room on 5/19. She had chest compressions, epi bicarb given. She was intubated by anesthesia and had ROSC in approximately 9 minutes. Downtime prior to code is unknown. She got dilaudid in the afternoon for leg pain.  SUBJECTIVE:  Trach dislodged over the weekend, replaced without difficulty.  Pt on RA.  Mother at bedside > reports trach has been capped for several days (thinks since 7/11).  Pt denies cough, sputum production, SOB.   VITAL SIGNS: BP 112/78   Pulse (!) 121   Temp (!) 97.1 F (36.2 C) (Axillary)   Resp 11   Ht 5' 8.5" (1.74 m)   Wt 174 lb 2.6 oz (79 kg)   LMP  (LMP Unknown) Comment: Patient trached  SpO2 98%   BMI 26.10 kg/m   VENTILATOR SETTINGS: FiO2 (%):  [21 %] 21 %  INTAKE / OUTPUT: I/O last 3 completed shifts: In: 27 [IV Piggyback:50] Out: 50 [Drains:50]  General: chronically ill appearing female in NAD HEENT: MM pink/moist, trach midline c/d/i PSY: calm/appropriate Neuro: Awake, alert, MAE CV: s1s2 rrr, no m/r/g PULM: even/non-labored, lungs bilaterally clear SH:FWYO, non-tender, bsx4 active  Extremities: warm/dry, no edema  Skin: no rashes or lesions  LABS:  BMET  Recent Labs Lab 04/08/17 0333 04/09/17 0429 04/11/17 0341  NA 137 138 135  K 4.5 4.6 3.9  CL 97* 97* 94*  CO2 27 27 28   BUN 29* 48* 33*   CREATININE 3.61* 5.16* 4.12*  GLUCOSE 99 110* 104*   Electrolytes  Recent Labs Lab 04/07/17 0427  04/08/17 0333 04/09/17 0429 04/11/17 0341  CALCIUM 8.7*  < > 8.7* 9.0 9.2  PHOS 6.0*  --   --   --   --   < > = values in this interval not displayed.   CBC  Recent Labs Lab 04/08/17 0333 04/09/17 0429 04/11/17 0341  WBC 13.4* 11.7* 10.1  HGB 7.9* 8.1* 8.3*  HCT 28.2* 28.3* 29.0*  PLT 472* 469* 397   No results for input(s): PHART, PCO2ART, PO2ART in the last 168 hours.  Liver Enzymes  Recent Labs Lab 04/07/17 0427 04/07/17 1415 04/08/17 0333  AST  --  28 19  ALT  --  8* 12*  ALKPHOS  --  194* 226*  BILITOT  --  1.0 1.1  ALBUMIN 2.1* 2.2* 2.2*   Glucose  Recent Labs Lab 04/11/17 2119 04/12/17 0852 04/12/17 1247 04/12/17 1618 04/12/17 2122 04/13/17 0753  GLUCAP 141* 128* 113* 165* 135* 116*   STUDIES:  Lower extremity ultrasound 5/18 >> no evidence of DVT CT head 5/19 >> normal exam Echo 5/20 >> EF 30-35%, increased LVF. Diffuse hypokinesis, akinesis of basilar mid-inferior myocardium, grade 2 diastolic dysfxn  EEG 5/20 >> finding c/w mod to severe global cerebral dysfxn. C/w anoxic injury given clinical course  LE [place] 5/21 >> negative MRI brain 5/24 >> ?thrombosed cortical vein, otherwise normal TEE 6/4 >>  mild MR, normal AV, mild TR, mild PR, EF 30-35%, diffuse hypokinesis, no thrombus, no PFO, normal RV, moderate pericardial effusion with synechia suggesting some chronicity, no vegetations   CULTURES: Bcx 5/19 X 2 >> negative Blood cultures 6/1 >> negative C diff 6/1 >> negative  ANTIBIOTICS: Clinidamycin 5/19 >> 5/21 Vancomycin 5/19 >> 5/25 >> restart 5/30 Zosyn 5/19 >> 5/22 Meropenem 5/22 >> 5/30 Flagyl 5/30 >>off Ceftriaxone 5/30 >> 6/4   SIGNIFICANT EVENTS: 5/19  Admit, cardiac arrest, transfer to ICU; hypothermia protocol started 5/24  Off pressors.  MRI w/out evidence of ischemia  6/03  Tolerated ATC couple hours, then back to vent,  HD  7/11  Trach capped  7/16  On RA   LINES/TUBES: ETT 5/19 >> 5/31 Lt IJ CVL 5/19 >> out Trach 5/31 >>   ASSESSMENT / PLAN:  Discussion:  36 y/o F with PMH of poorly controlled DM, ESRD on HD and known gangrene of L achilles heel, admitted with non-healing wounds.  Admitted per TRH.  Received dilaudid for pain and subsequently suffered a cardiac arrest.  Trach dependent respiratory failure post cardiac arrest - currently with #4 uncuffed shiley -Working with Speech with PMV while awake. -Tolerating HD / Hemodynamically stable  P: Wean O2 for sats > 92% Trach care per protocol  PT as able  SLP following > D2 Diet, PMV while awake with direct observation Intermittent CXR Likely can decannulate 7/16, will discuss with attending    Canary Brim, NP-C [facility] Pgr: [phone] or if no answer [phone] 04/13/2017, 11:30 AM

## 2017-04-13 NOTE — Plan of Care (Signed)
Problem: Skin Integrity: Goal: Risk for impaired skin integrity will decrease Outcome: Progressing [name]'s butt cream applied to wounds on buttocks whenever patient is cleaned. Encourage patient to turn off of right side- but she repositions self to slide back there. Foot of bed elevated and HOB less than 30 degrees. Continue monitoring patient skin.

## 2017-04-13 NOTE — Progress Notes (Signed)
Physical Therapy Treatment Patient Details Name: [name] MRN: [id number] DOB: 1980/10/04 Today's Date: 04/13/2017    History of Present Illness [name] is a 36 y.o. 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 ETT 5/19 and trach'd 5/31. Pt now with trach on room air.     PT Comments    Pt with slow progress. Sleepier today and required constant stimuli to stay awake.   Follow Up Recommendations  SNF;Supervision/Assistance - 24 hour     Equipment Recommendations  Other (comment) (defer to next venue)    Recommendations for Other Services       Precautions / Restrictions Precautions Precautions: Fall Precaution Comments: trach (trach now plugged) Restrictions RLE Weight Bearing: Non weight bearing LLE Weight Bearing: Non weight bearing    Mobility  Bed Mobility Overal bed mobility: Needs Assistance Bed Mobility: Supine to Sit;Sit to Sidelying;Rolling Rolling: Total assist (more assist due to lethargy)   Supine to sit: +2 for physical assistance;Max assist   Sit to sidelying: +2 for physical assistance;Mod assist General bed mobility comments: Assist to elevate trunk into sitting and to bring hips to EOB.  Assist to lower trunk and bring leg up into bed.  Transfers                    Ambulation/Gait                 Stairs            Wheelchair Mobility    Modified Rankin (Stroke Patients Only)       Balance Overall balance assessment: Needs assistance Sitting-balance support: Bilateral upper extremity supported;Feet supported Sitting balance-Leahy Scale: Poor Sitting balance - Comments: Pt sat EOB x ~15 minutes with min to mod assist.  Pt with difficulty modulating balance corrections moving from posterior lean to heavy anterior lean and vice versa. Pt participated in ADL activities with OT. Postural control: Posterior lean;Other (comment) (anterior lean)                                  Cognition Arousal/Alertness: Lethargic Behavior During Therapy: Flat affect Overall Cognitive Status: Impaired/Different from baseline Area of Impairment: Memory;Following commands;Safety/judgement;Problem solving;Attention                   Current Attention Level: Sustained Memory: Decreased recall of precautions;Decreased short-term memory Following Commands: Follows one step commands with increased time;Follows one step commands inconsistently Safety/Judgement: Decreased awareness of deficits;Decreased awareness of safety   Problem Solving: Slow processing;Decreased initiation;Difficulty sequencing;Requires tactile cues;Requires verbal cues General Comments: Pt would arouse for periods with stimuli but falling asleep frequently.      Exercises      General Comments        Pertinent Vitals/Pain Pain Assessment: Faces Faces Pain Scale: No hurt    Home Living                      Prior Function            PT Goals (current goals can now be found in the care plan section)  Progress towards PT goals: Progressing toward goals    Frequency    Min 2X/week      PT Plan Current plan remains appropriate    Co-evaluation PT/OT/SLP Co-Evaluation/Treatment: Yes Reason for Co-Treatment: Complexity of the patient's impairments (multi-system involvement);For patient/therapist safety          AM-PAC PT "6 Clicks" Daily Activity  Outcome Measure  Difficulty turning over in bed (including adjusting bedclothes, sheets and blankets)?: Total Difficulty moving from lying on back to sitting on the side of the bed? : Total Difficulty sitting down on and standing up from a chair with arms (e.g.,  wheelchair, bedside commode, etc,.)?: Total Help needed moving to and from a bed to chair (including a wheelchair)?: Total Help needed walking in hospital room?: Total Help needed climbing 3-5 steps with a railing? : Total 6 Click Score: 6    End of Session   Activity Tolerance: Patient limited by lethargy Patient left: in bed;with call bell/phone within reach;with family/visitor present Nurse Communication: Mobility status;Need for lift equipment PT Visit Diagnosis: Muscle weakness (generalized) (M62.81);Other abnormalities of gait and mobility (R26.89)     Time: 1040-1106 PT Time Calculation (min) (ACUTE ONLY): 26 min  Charges:  $Therapeutic Activity: 8-22 mins                    G Codes:       [facility] PT [phone]    Angelina [facility] 04/13/2017, 11:29 AM

## 2017-04-13 NOTE — Progress Notes (Signed)
Occupational Therapy Treatment Patient Details Name: [name] MRN: [id number] DOB: Oct 03, 1980 Today's Date: 04/13/2017    History of present illness [name] is a 36 y.o. 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 ETT 5/19 and trach'd 5/31. Pt now with trach on room air.    OT comments  Pt seen in conjunction with PT in order to maximize functional gains. She is demonstrating progress toward OT goals this session. She was able to complete seated grooming tasks with mod assist and multimodal cues to initiate. Pt continues to require significantly increased time to follow one-step commands and was able to follow approximately 30% this session. Pt lethargic this session additionally requiring consistent stimuli to maintain participation. D/C plan remains appropriate.  Follow Up Recommendations  SNF;Supervision/Assistance - 24 hour    Equipment Recommendations  Wheelchair (measurements OT);Wheelchair cushion (measurements OT);Hospital bed;3 in 1 bedside commode    Recommendations for Other Services      Precautions / Restrictions Precautions Precautions: Fall Precaution Comments: trach (trach now plugged) Restrictions Weight Bearing Restrictions: Yes RLE Weight Bearing: Non weight bearing LLE Weight Bearing: Non weight bearing Other Position/Activity Restrictions: PRAFO boot on RLE       Mobility Bed Mobility Overal bed mobility: Needs Assistance Bed Mobility: Supine to Sit;Sit to Sidelying;Rolling Rolling: Total assist (increased assist due to lethargy)   Supine to sit: +2 for physical assistance;Max assist   Sit to sidelying: +2 for physical assistance;Mod assist General bed  mobility comments: Assist to elevate trunk into sitting and to bring hips to EOB.  Assist to lower trunk and bring leg up into bed.  Transfers                      Balance Overall balance assessment: Needs assistance Sitting-balance support: Bilateral upper extremity supported;Feet supported Sitting balance-Leahy Scale: Poor Sitting balance - Comments: Pt sat EOB x ~15 minutes with min to mod assist while participating in ADL tasks. Pt with difficulty modulating balance corrections moving from posterior lean to heavy anterior lean and vice versa.  Postural control: Posterior lean;Other (comment) (anterior lean)                                 ADL either performed or assessed with clinical judgement   ADL Overall ADL's : Needs assistance/impaired     Grooming: Moderate assistance;Sitting Grooming Details (indicate cue type and reason): With cueing and assist to initiate movement, able to wash her face with mod assist while seated at EOB with assistance for balance.                                General ADL Comments: Pt able to sit at EOB to participate in grooming tasks this session.      Vision       Perception     Praxis      Cognition Arousal/Alertness: Lethargic Behavior During Therapy: Flat affect Overall Cognitive Status: Impaired/Different from baseline Area of Impairment: Memory;Following commands;Safety/judgement;Problem solving;Attention  Current Attention Level: Sustained Memory: Decreased recall of precautions;Decreased short-term memory Following Commands: Follows one step commands with increased time;Follows one step commands inconsistently Safety/Judgement: Decreased awareness of deficits;Decreased awareness of safety Awareness: Intellectual Problem Solving: Slow processing;Decreased initiation;Difficulty sequencing;Requires tactile cues;Requires verbal cues General Comments: Pt would arouse for  periods with stimuli but falling asleep frequently.        Exercises Exercises: Other exercises Other Exercises Other Exercises: Facilitated functional reaching with B UE during ADL tasks and pt able to demonstrate approximately 90 degrees of shoulder flexion bilaterally.    Shoulder Instructions       General Comments Mother present during session    Pertinent Vitals/ Pain       Pain Assessment: Faces Faces Pain Scale: No hurt  Home Living                                          Prior Functioning/Environment              Frequency  Min 2X/week        Progress Toward Goals  OT Goals(current goals can now be found in the care plan section)  Progress towards OT goals: Progressing toward goals  Acute Rehab OT Goals Patient Stated Goal: get more mobile OT Goal Formulation: With family Time For Goal Achievement: 04/16/17 Potential to Achieve Goals: Fair ADL Goals Pt Will Perform Eating: with mod assist;sitting Pt Will Perform Grooming: with mod assist;sitting;bed level Additional ADL Goal #1: Pt will follow one step commands 50 % of time. Additional ADL Goal #2: Pt will roll with min assist for pericare and repositioning. Additional ADL Goal #3: Pt will sit with min assist and head at midline x 5 minutes.  Plan Discharge plan remains appropriate    Co-evaluation    PT/OT/SLP Co-Evaluation/Treatment: Yes Reason for Co-Treatment: Complexity of the patient's impairments (multi-system involvement);For patient/therapist safety   OT goals addressed during session: ADL's and self-care;Strengthening/ROM      AM-PAC PT "6 Clicks" Daily Activity     Outcome Measure   Help from another person eating meals?: A Lot Help from another person taking care of personal grooming?: A Lot Help from another person toileting, which includes using toliet, bedpan, or urinal?: Total Help from another person bathing (including washing, rinsing, drying)?:  Total Help from another person to put on and taking off regular upper body clothing?: Total Help from another person to put on and taking off regular lower body clothing?: Total 6 Click Score: 8    End of Session    OT Visit Diagnosis: Other abnormalities of gait and mobility (R26.89);Muscle weakness (generalized) (M62.81);Feeding difficulties (R63.3);Other symptoms and signs involving cognitive function;Cognitive communication deficit (R41.841) Pain - Right/Left: Left Pain - part of body: Ankle and joints of foot   Activity Tolerance Patient tolerated treatment well   Patient Left in bed;with call bell/phone within reach;with family/visitor present   Nurse Communication          Time: 1040-1106 OT Time Calculation (min): 26 min  Charges: OT General Charges $OT Visit: 1 Procedure OT Treatments $Self Care/Home Management : 8-22 mins  Doristine Section, MS OTR/L  Pager: [phone]    Janiesha Diehl A Roscoe Witts 04/13/2017, 5:57 PM

## 2017-04-13 NOTE — Progress Notes (Signed)
[facility] Rounding Note  Subjective:  Eating breakfast, mom at bedside.  No complaints.    Vital signs in last 24 hours: Vitals:   04/13/17 0700 04/13/17 0731 04/13/17 0754 04/13/17 0927  BP: 119/90  119/90 112/78  Pulse:   (!) 117 (!) 121  Resp: (!) 24 15    Temp:   (!) 97.1 F (36.2 C)   TempSrc:   Axillary   SpO2:  98% 97%   Weight:      Height:       Weight change: -1 kg (-2 lb 3.3 oz)  Intake/Output Summary (Last 24 hours) at 04/13/17 1005 Last data filed at 04/13/17 0820  Gross per 24 hour  Intake                0 ml  Output               50 ml  Net              -50 ml   Physical Exam: GEN Alert, smiling, talking HEENT: trach capped PULM: Anteriorly coarse, normal WOB CV tachycardic  ABD obese, soft and non tender EXT: Left AKA with wound VAC in place, serosanguinous fluid, no edema, Right foot in protective boot Dialysis Access: Left AVF + bruit and thrill  Dialysis: TTS [facility] 4h 57mn   F200  114kg   2/2 bath  LUA AVF  Hep none - Iron Sucrose (Venofer) 50 mg IVP During Dialysis 1X Week  - Mircera 75 mcg IV q  2 weeks ( last on 02/05/17) - Vitamin D (Calcitriol) Oral 0.75 mcg  Po q hd   Summary: [age]year old female w/ ESRD d/t poorly controlled DM, HD bilateral TMA's, obesity, HTN, medical non-compliance, NICM. Admitted 5/19 w/left leg pain, + radiological evidence of osteomyelitis w/wound nonhealing, dehiscence, purulent drainage. Admitted for IV antibiotics, hemodialysis and possible surgery. Found unresponsive in her room that afternoon - cardiac arrest > rec'd  CPR, epi bicarb; intubated; cooled. Now with anoxic brain injury, trach and s/p L AKA this hosp. Course further complicated by recurrent aspiration PNA, dehiscence of AK wound requiring revision 7/6.   Assessment/ Plan: 1. Anoxic brain injury sp arrest - pt awake, speaking, eating now. Improved.  2. Trach - trach appears to be capped.  Not on vent since 6/20. Possible decannulation today, per CCM.  3. Fever/ PNA  /^WBC - completed abx 7/12 4. ESRD -  TTS HD, started HD June '17. Continue normal schedule 5. HTN - BP's up the last 48 hrs, on low dose Coreg. Have d/c'd midodrine  6. Anemia- on max aranesp- hgb stable in 8's  7. MBD of CKD - phos in range, Ca++ creeping up, will dc CaCO3. PTH 88 here.   8. 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   9. DM2 - per primary 10. sHF - EF 25-30% contributes to BP issues on HD 11. Nutrition- unclear if will be able to take enough in to meet needs, albumin 2.2 12. Dispo- difficult with ESRD and trach- [place] Medicaid so not sure LTAC is option. If has successful decannulation then dispo may become easier.     EMadelon LipsMD CTrinity Regional Hospitalpgr [phone]/05/2017, 11:14 AM   Recent Labs Lab 04/07/17 0427  04/08/17 0333 04/09/17 0429 04/11/17 0341  NA 137  < > 137 138 135  K 4.8  < > 4.5 4.6 3.9  CL 94*  < > 97* 97* 94*  CO2 27  < > 27 27 28   GLUCOSE 120*  < > 99 110* 104*  BUN 53*  < > 29* 48* 33*  CREATININE 5.63*  < > 3.61* 5.16* 4.12*  CALCIUM 8.7*  < > 8.7* 9.0 9.2  PHOS 6.0*  --   --   --   --   < > = values in this interval not displayed.  Recent Labs Lab 04/07/17 0427 04/07/17 1415 04/08/17 0333  AST  --  28 19  ALT  --  8* 12*  ALKPHOS  --  194* 226*  BILITOT  --  1.0 1.1  PROT  --  6.5 6.9  ALBUMIN 2.1* 2.2* 2.2*    Recent Labs Lab 04/07/17 0427 04/08/17 0333 04/09/17 0429 04/11/17 0341  WBC 16.4* 13.4* 11.7* 10.1  HGB 7.3* 7.9* 8.1* 8.3*  HCT 25.1* 28.2* 28.3* 29.0*  MCV 97.7 98.9 97.9 98.0  PLT 423* 472* 469* 397     Recent Labs Lab 04/12/17 0852 04/12/17 1247 04/12/17 1618 04/12/17 2122 04/13/17 0753  GLUCAP 128* 113* 165* 135* 116*    Medications: Infusions:   Scheduled Medications: . carvedilol  6.25 mg Oral BID WC  . chlorhexidine gluconate (MEDLINE KIT)  15 mL Mouth Rinse BID  . darbepoetin (ARANESP) injection - DIALYSIS  200 mcg Intravenous Q Thu-HD  . dextrose  1 ampule  Intravenous Once  . docusate sodium  100 mg Oral BID  . DULoxetine  30 mg Oral Daily  . feeding supplement (PRO-STAT SUGAR FREE 64)  30 mL Oral TID  . fluconazole  100 mg Oral Daily  . [name]'s butt cream   Topical QID  . heparin  5,000 Units Subcutaneous Q8H  . insulin aspart  0-15 Units Subcutaneous TID WC  . insulin glargine  8 Units Subcutaneous Daily  . mouth rinse  15 mL Mouth Rinse QID  . multivitamin  1 tablet Oral QHS  . nystatin  5 mL Oral QID

## 2017-04-14 LAB — GLUCOSE, CAPILLARY
GLUCOSE-CAPILLARY: 114 mg/dL — AB (ref 65–99)
GLUCOSE-CAPILLARY: 117 mg/dL — AB (ref 65–99)
Glucose-Capillary: 100 mg/dL — ABNORMAL HIGH (ref 65–99)

## 2017-04-14 LAB — CBC
HEMATOCRIT: 31 % — AB (ref 36.0–46.0)
Hemoglobin: 8.9 g/dL — ABNORMAL LOW (ref 12.0–15.0)
MCH: 28.7 pg (ref 26.0–34.0)
MCHC: 28.7 g/dL — AB (ref 30.0–36.0)
MCV: 100 fL (ref 78.0–100.0)
PLATELETS: 483 10*3/uL — AB (ref 150–400)
RBC: 3.1 MIL/uL — ABNORMAL LOW (ref 3.87–5.11)
RDW: 18.9 % — AB (ref 11.5–15.5)
WBC: 9.2 10*3/uL (ref 4.0–10.5)

## 2017-04-14 LAB — RENAL FUNCTION PANEL
ALBUMIN: 2.4 g/dL — AB (ref 3.5–5.0)
Anion gap: 12 (ref 5–15)
BUN: 47 mg/dL — AB (ref 6–20)
CALCIUM: 9.5 mg/dL (ref 8.9–10.3)
CO2: 29 mmol/L (ref 22–32)
Chloride: 100 mmol/L — ABNORMAL LOW (ref 101–111)
Creatinine, Ser: 5 mg/dL — ABNORMAL HIGH (ref 0.44–1.00)
GFR calc Af Amer: 12 mL/min — ABNORMAL LOW (ref >60)
GFR, EST NON AFRICAN AMERICAN: 10 mL/min — AB (ref >60)
GLUCOSE: 116 mg/dL — AB (ref 65–99)
PHOSPHORUS: 4.8 mg/dL — AB (ref 2.5–4.6)
Potassium: 4.4 mmol/L (ref 3.5–5.1)
Sodium: 141 mmol/L (ref 135–145)

## 2017-04-14 MED ORDER — ORAL CARE MOUTH RINSE
15.0000 mL | Freq: Two times a day (BID) | OROMUCOSAL | Status: DC
  Administered 2017-04-14 – 2017-04-30 (×26): 15 mL via OROMUCOSAL

## 2017-04-14 NOTE — Consult Note (Signed)
WOC Nurse follow-up wound consult note Ortho service following for assessment and plan of care to left stump surgical site.  Reason for Consult: Right heel with unstageable pressure injury. Pressure Injury POA: No Measurement: 4.5X6cm Wound bed: 100% dry eschar; no odor, drainage, or fluctuance. Dressing procedure/placement/frequency: Prevalon boot is in place to reduce pressure. Foam dressing to protect from further injury. It is best practice to leave dry stable eschar in place. No topical treatment is indicated at this time; will plan to re-assess weekly. Discussed plan of care with mother at the bedside. Cammie Mcgee MSN, RN, CWOCN, [place], CNS [phone].

## 2017-04-14 NOTE — Progress Notes (Signed)
[facility] Rounding Note  Subjective:  Coming to dialysis in a chair.  No complaints this AM.  Decannulated yesterday and tolerated well.   Vital signs in last 24 hours: Vitals:   04/14/17 0500 04/14/17 0600 04/14/17 0727 04/14/17 0801  BP: (!) 154/106 (!) 139/116 (!) 135/91   Pulse:   (!) 118 (!) 113  Resp:    18  Temp:   98.2 F (36.8 C)   TempSrc:      SpO2:   98%   Weight: 80 kg (176 lb 5.9 oz)     Height:       Weight change: 1 kg (2 lb 3.3 oz) No intake or output data in the 24 hours ending 04/14/17 1610 Physical Exam: GEN Alert, NAD HEENT: dressing c/d/i over prior trach site PULM: Anteriorly coarse, normal WOB CV tachycardic  ABD obese, soft and non tender EXT: Left AKA with wound VAC in place, serosanguinous fluid, no edema, Right foot in protective boot Dialysis Access: Left AVF + bruit and thrill  Dialysis: TTS [facility] 4h   F200  114kg   2/2 bath  LUA AVF  Hep none - Iron Sucrose (Venofer) 50 mg IVP During Dialysis 1X Week  - Mircera 75 mcg IV q  2 weeks ( last on 02/05/17) - Vitamin D (Calcitriol) Oral 0.75 mcg  Po q hd   Summary: 36 year old female w/ ESRD d/t poorly controlled DM, HD bilateral TMA's, obesity, HTN, medical non-compliance, NICM. Admitted 5/19 w/left leg pain, + radiological evidence of osteomyelitis w/wound nonhealing, dehiscence, purulent drainage. Admitted for IV antibiotics, hemodialysis and possible surgery. Found unresponsive in her room that afternoon - cardiac arrest > rec'd  CPR, epi bicarb; intubated; cooled. Now with anoxic brain injury, trach and s/p L AKA this hosp. Course further complicated by recurrent aspiration PNA, dehiscence of AK wound requiring revision 7/6.   Assessment/ Plan: 1. Anoxic brain injury sp arrest - pt awake, speaking, eating now. Improved.  2. Trach - s/p decannulation 7/16. 3. Fever/ PNA /^WBC - completed abx 7/12.  Resolved. 4. ESRD -  TTS HD, started HD June '17. Continue normal schedule 5. HTN - BP's up the last  48 hrs, on low dose Coreg. Have d/c'd midodrine 6. Anemia- on max aranesp- hgb stable in 8's.  Will repeat iron panel today.   7. MBD of CKD - phos in range, Ca++ creeping up, will dc CaCO3. PTH 88 here.   8. 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   9. DM2 - per primary 10. sHF - EF 25-30% contributes to BP issues on HD 11. Nutrition- unclear if will be able to take enough in to meet needs, albumin 2.2.  Taking prostat. 12. Dispo- difficult with ESRD and trach- [place] Medicaid so not sure LTAC is option. If has successful decannulation then dispo may become easier.     Bufford Buttner MD [facility] pgr [phone]    Recent Labs Lab 04/09/17 [phone] 04/11/17 0341 04/13/17 1426  NA 138 135 139  K 4.6 3.9 4.5  CL 97* 94* 99*  CO2 27 28 27   GLUCOSE 110* 104* 110*  BUN 48* 33* 36*  CREATININE 5.16* 4.12* 4.27*  CALCIUM 9.0 9.2 9.4  PHOS  --   --  4.6    Recent Labs Lab 04/07/17 1415 04/08/17 0333 04/13/17 1426  AST 28 19  --   ALT 8* 12*  --   ALKPHOS 194* 226*  --  BILITOT 1.0 1.1  --   PROT 6.5 6.9  --   ALBUMIN 2.2* 2.2* 2.5*    Recent Labs Lab 04/08/17 0333 04/09/17 0429 04/11/17 0341 04/13/17 1426  WBC 13.4* 11.7* 10.1 9.7  HGB 7.9* 8.1* 8.3* 9.1*  HCT 28.2* 28.3* 29.0* 32.5*  MCV 98.9 97.9 98.0 102.2*  PLT 472* 469* 397 434*     Recent Labs Lab 04/12/17 2122 04/13/17 0753 04/13/17 1134 04/13/17 1641 04/14/17 0758  GLUCAP 135* 116* 222* 73 114*    Medications: Infusions: . sodium chloride    . sodium chloride      Scheduled Medications: . carvedilol  6.25 mg Oral BID WC  . darbepoetin (ARANESP) injection - DIALYSIS  200 mcg Intravenous Q Thu-HD  . dextrose  1 ampule Intravenous Once  . docusate sodium  100 mg Oral BID  . DULoxetine  30 mg Oral Daily  . feeding supplement (PRO-STAT SUGAR FREE 64)  30 mL Oral TID  . fluconazole  100 mg Oral Daily  . [name]'s butt cream   Topical QID  . heparin  5,000 Units  Subcutaneous Q8H  . insulin aspart  0-15 Units Subcutaneous TID WC  . insulin glargine  8 Units Subcutaneous Daily  . mouth rinse  15 mL Mouth Rinse BID  . multivitamin  1 tablet Oral QHS  . nystatin  5 mL Oral QID

## 2017-04-14 NOTE — Progress Notes (Signed)
PULMONARY / CRITICAL CARE MEDICINE   Name: [name] MRN: [id number] DOB: [birth year]    ADMISSION DATE:  02/14/2017 CONSULTATION DATE:  02/14/2017  REFERRING MD:  Feliz Beam MD  CHIEF COMPLAINT:  Sepsis, cardiac arrest  BRIEF SUMMARY:   36 year old female w/ ESRD d/t poorly controlled DM, bilateral transmetatarsal foot amputations, obesity, HTN, medical non-compliance as well as NICM. Admitted 5/19 with complaints of left leg pain, found to have radiological evidence of osteomyelitis with wound nonhealing, dehiscence, purulent drainage from the wound. Admitted for IV antibiotics, hemodialysis and possible surgery.  Found unresponsive in the room on 5/19. She had chest compressions, epi bicarb given. She was intubated by anesthesia and had ROSC in approximately 9 minutes. Downtime prior to code is unknown. She got dilaudid in the afternoon for leg pain.  SUBJECTIVE:  Pt denies significant secretions from trach site.    VITAL SIGNS: BP (!) 123/91   Pulse (!) 117   Temp 98 F (36.7 C) (Oral)   Resp 17   Ht 5' 8.5" (1.74 m)   Wt 176 lb 5.9 oz (80 kg)   LMP  (LMP Unknown) Comment: Patient trached  SpO2 95%   BMI 26.43 kg/m   VENTILATOR SETTINGS: FiO2 (%):  [21 %] 21 %  INTAKE / OUTPUT: I/O last 3 completed shifts: In: -  Out: 50 [Drains:50]  General:  Chronically ill appearing female in NAD, sitting in chair for HD HEENT: MM pink/moist, trach site clean/dry/intact, no significant leak from site PSY: calm Neuro: Awakens to voice, MAE, generalized weakness  CV: s1s2 rrr, no m/r/g PULM: even/non-labored, lungs bilaterally clear, soft voice projection [id number], non-tender, bsx4 active  Extremities: warm/dry, no edema  Skin: no rashes or lesions   LABS:  BMET  Recent Labs Lab 04/11/17 0341 04/13/17 1426 04/14/17 0730  NA 135 139 141  K 3.9 4.5 4.4  CL 94* 99* 100*  CO2 28 27 29   BUN 33* 36* 47*  CREATININE 4.12* 4.27* 5.00*  GLUCOSE 104* 110* 116*    Electrolytes  Recent Labs Lab 04/11/17 0341 04/13/17 1426 04/14/17 0730  CALCIUM 9.2 9.4 9.5  PHOS  --  4.6 4.8*     CBC  Recent Labs Lab 04/11/17 0341 04/13/17 1426 04/14/17 0730  WBC 10.1 9.7 9.2  HGB 8.3* 9.1* 8.9*  HCT 29.0* 32.5* 31.0*  PLT 397 434* 483*   No results for input(s): PHART, PCO2ART, PO2ART in the last 168 hours.  Liver Enzymes  Recent Labs Lab 04/07/17 1415 04/08/17 0333 04/13/17 1426 04/14/17 0730  AST 28 19  --   --   ALT 8* 12*  --   --   ALKPHOS 194* 226*  --   --   BILITOT 1.0 1.1  --   --   ALBUMIN 2.2* 2.2* 2.5* 2.4*   Glucose  Recent Labs Lab 04/12/17 1618 04/12/17 2122 04/13/17 0753 04/13/17 1134 04/13/17 1641 04/14/17 0758  GLUCAP 165* 135* 116* 222* 73 114*   STUDIES:  Lower extremity ultrasound 5/18 >> no evidence of DVT CT head 5/19 >> normal exam Echo 5/20 >> EF 30-35%, increased LVF. Diffuse hypokinesis, akinesis of basilar mid-inferior myocardium, grade 2 diastolic dysfxn  EEG 5/20 >> finding c/w mod to severe global cerebral dysfxn. C/w anoxic injury given clinical course  LE [place] 5/21 >> negative MRI brain 5/24 >> ?thrombosed cortical vein, otherwise normal TEE 6/4 >> mild MR, normal AV, mild TR, mild PR, EF 30-35%, diffuse hypokinesis, no thrombus, no PFO, normal  RV, moderate pericardial effusion with synechia suggesting some chronicity, no vegetations   CULTURES: Bcx 5/19 X 2 >> negative Blood cultures 6/1 >> negative C diff 6/1 >> negative  ANTIBIOTICS: Clinidamycin 5/19 >> 5/21 Vancomycin 5/19 >> 5/25 >> restart 5/30 Zosyn 5/19 >> 5/22 Meropenem 5/22 >> 5/30 Flagyl 5/30 >> off Ceftriaxone 5/30 >> 6/4   SIGNIFICANT EVENTS: 5/19  Admit, cardiac arrest, transfer to ICU; hypothermia protocol started 5/24  Off pressors.  MRI w/out evidence of ischemia  6/03  Tolerated ATC couple hours, then back to vent, HD  7/11  Trach capped  7/16  On RA.  Trach dislodged over the weekend, replaced without  difficulty. Decannulated late PM  LINES/TUBES: ETT 5/19 >> 5/31 Lt IJ CVL 5/19 >> out Trach 5/31 >> 7/16  ASSESSMENT / PLAN:  Discussion:  36 y/o F with PMH of poorly controlled DM, ESRD on HD and known gangrene of L achilles heel, admitted with non-healing wounds.  Admitted per TRH.  Received dilaudid for pain and subsequently suffered a cardiac arrest.  Trach dependent respiratory failure post cardiac arrest - s/p prolonged trach need, decannulated 7/16.   -Working with Speech with PMV while awake. -Tolerating HD / Hemodynamically stable  P: Wean O2 for sats > 92% Clean trach stoma Q shift and PRN  SLP following, appreciate input   PCCM will be available PRN.  Please call back if new needs arise.    Canary Brim, NP-C [facility] Pgr: [phone] or if no answer [phone] 04/14/2017, 10:32 AM

## 2017-04-14 NOTE — Plan of Care (Signed)
Problem: Activity: Goal: Risk for activity intolerance will decrease Outcome: Progressing Day shift RN encouraged patient to start doing things for herself. Patient able to hold a cup and drink water- use a spoon to feed. Patient supposed to get up in chair for HD - important for her to sit up and tolerate prior to discharge.

## 2017-04-14 NOTE — Progress Notes (Addendum)
Nutrition Follow-up  DOCUMENTATION CODES:   Obesity unspecified  INTERVENTION:   Continue:  Pro-stat 30 ml TID, each supplement provides 100 kcal and 15 gm protein  Rena-vit daily  NUTRITION DIAGNOSIS:   Increased nutrient needs related to wound healing as evidenced by estimated needs.  Ongoing  GOAL:   Patient will meet greater than or equal to 90% of their needs  Met  MONITOR:   PO intake, Supplement acceptance, Diet advancement, Labs, Weight trends, Skin, I & O's  REASON FOR ASSESSMENT:   Consult Enteral/tube feeding initiation and management  ASSESSMENT:   36 yo Female w/ ESRD d/t poorly controlled DM, bilateral transmetatarsal foot amputations, obesity, HTN, medical non-compliance as well as NICM. Admitted with complaints of left leg pain, found to have radiological evidence of osteomyelitis with wound nonhealing, dehiscence, purulent drainage from the wound. Admitted for IV antibiotics, hemodialysis and possible surgery. Pt with nonhealing L foot wound, amputation recommended.   Patient with improved appetite, now consuming 100% of meals. She is also receiving Pro-stat 30 ml TID PO, tolerating well.  She was decannulated on 7/16. S/P HD today. VAC being removed from AKA site today. Wound to be covered with dry dressing. Labs reviewed: phosphorus 4.8 (H) CBG's: 114-100 Medications reviewed and include Colace, Rena-vit.  Diet Order:  DIET DYS 2 Room service appropriate? Yes with Assist; Fluid consistency: Nectar Thick  Skin:  Wound (see comment) (lt AKA w vac, UN rt heel, MASD anus) VAC being removed from AKA today  Last BM:  7/17  Height:   Ht Readings from Last 1 Encounters:  02/14/17 5' 8.5" (1.74 m)    Weight:   Wt Readings from Last 1 Encounters:  04/14/17 176 lb 5.9 oz (80 kg)    Ideal Body Weight:  63.6 kg  BMI:  Body mass index is 26.43 kg/m.  Estimated Nutritional Needs:   Kcal:  2100-2300  Protein:  110-130 grams  Fluid:  Per  MD  EDUCATION NEEDS:   No education needs identified at this time  Molli Barrows, [place], [place], [place] Pager [phone] After Hours Pager [phone]

## 2017-04-14 NOTE — Procedures (Signed)
Patient seen and examined on Hemodialysis. QB 400 AVF, UF goal even.  Watching blood pressure and HR now that midodrine has been d/c'd.  Treatment adjusted as needed.  Bufford Buttner MD [facility] pgr [phone] 9:07 AM

## 2017-04-14 NOTE — NC FL2 (Signed)
[place] MEDICAID FL2 LEVEL OF CARE SCREENING TOOL     IDENTIFICATION  Patient Name: [name] Birthdate: 1980/11/15 Sex: female Admission Date (Current Location): 02/14/2017  [facility] and [place] Number:  Producer, television/film/video and Address:  [facility] [place]. [facility], [address] [place], [place] [address]      Provider Number: [id number]  Attending Physician Name and Address:  Calvert Cantor, MD  Relative Name and Phone Number:       Current Level of Care: Hospital Recommended Level of Care: Skilled Nursing Facility Prior Approval Number:    Date Approved/Denied:   PASRR Number: [id number]  Discharge Plan: SNF    Current Diagnoses: Patient Active Problem List   Diagnosis Date Noted  . Aspiration into airway   . Fever   . Hypoxemia   . Abscess of left leg   . Tracheostomy status (HCC)   . Anoxic brain injury (HCC)   . Palliative care encounter   . Goals of care, counseling/discussion   . Tachycardia   . DNR (do not resuscitate) discussion 03/04/2017  . Palliative care by specialist 03/04/2017  . Protein calorie malnutrition (HCC)   . Acute on chronic respiratory failure with hypoxemia (HCC)   . Bacteremia   . Encounter for PEG (percutaneous endoscopic gastrostomy) (HCC)   . Elevated liver enzymes   . Jaundice   . Wound dehiscence   . Coma (HCC)   . Osteomyelitis (HCC) 02/14/2017  . Pressure injury of skin 02/14/2017  . Cardiac arrest, cause unspecified (HCC)   . Dehiscence of amputation stump (HCC)   . Status post transmetatarsal amputation of left foot (HCC) 10/21/2016  . Status post transmetatarsal amputation of right foot (HCC) 10/08/2016  . 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 Diabetic osteomyelitis (HCC)   . 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 Left foot infection 06/20/2016  . ESRD (end stage renal disease) (HCC) 06/20/2016  . Diabetic foot ulcer (HCC) 06/20/2016   . Chronic pain syndrome 02/24/2016  . Normal coronary arteries 10/12/2015  . NSTEMI- type 2, Troponin 11.2 05/11/2015  . Acute respiratory failure (HCC) 05/10/2015  . History of arthroscopy of right shoulder-05/10/15 05/10/2015  . CKD (chronic kidney disease) stage 5, GFR less than 15 ml/min (HCC) 09/21/2014  . Unspecified asthma(493.90) 10/25/2013  . Acute combined systolic and diastolic heart failure (HCC) 09/21/2013  . Non-ischemic cardiomyopathy - by echo 8/16- EF 35-40% 09/16/2013  . Morbid obesity-  09/16/2013  . 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) 09/15/2013  . Normocytic anemia 09/15/2013  . Lower extremity edema 09/15/2013  . Hyperlipidemia   . Hypertension     Orientation RESPIRATION BLADDER Height & Weight     Self, Place  Normal Continent Weight:  (UTO, pt in HD recliner chair.) Height:  5' 8.5" (174 cm)  BEHAVIORAL SYMPTOMS/MOOD NEUROLOGICAL BOWEL NUTRITION STATUS      Incontinent Diet (see DC summary)  AMBULATORY STATUS COMMUNICATION OF NEEDS Skin   Extensive Assist Verbally PU Stage and Appropriate Care (unstageable PU located on heel requiring foam dressing changes)   PU Stage 2 Dressing:  (anus- open air)                   Personal Care Assistance Level of Assistance  Bathing, Dressing, Feeding Bathing Assistance: Maximum assistance Feeding assistance: Limited assistance Dressing Assistance: Maximum assistance     Functional Limitations Info  Sight, Hearing, Speech Sight Info:  Adequate Hearing Info: Adequate Speech Info: Adequate    SPECIAL CARE FACTORS FREQUENCY  PT (By licensed PT), OT (By licensed OT)     PT Frequency: 5/wk OT Frequency: 5/wk            Contractures Contractures Info: Not present    Additional Factors Info  Allergies, Psychotropic, Code Status, Insulin Sliding Scale Code Status Info: FULL Allergies Info: Aspirin, Sulfur, Tramadol, Gabapentin Psychotropic Info: cymbalta Insulin Sliding Scale  Info: 4/day Isolation Precautions Info: NA     Current Medications (04/14/2017):  This is the current hospital active medication list Current Facility-Administered Medications  Medication Dose Route Frequency Provider Last Rate Last Dose  . 0.9 %  sodium chloride infusion  100 mL Intravenous PRN Bufford Buttner, MD      . 0.9 %  sodium chloride infusion  100 mL Intravenous PRN Bufford Buttner, MD      . acetaminophen (TYLENOL) tablet 650 mg  650 mg Oral Q6H PRN Nadara Mustard, MD   650 mg at 04/12/17 2111   Or  . acetaminophen (TYLENOL) suppository 650 mg  650 mg Rectal Q6H PRN Nadara Mustard, MD      . albuterol (PROVENTIL) (2.5 MG/3ML) 0.083% nebulizer solution 2.5 mg  2.5 mg Inhalation Q6H PRN Marlowe Kays E, PA-C   2.5 mg at 03/16/17 2352  . alteplase (CATHFLO ACTIVASE) injection 2 mg  2 mg Intracatheter Once PRN Bufford Buttner, MD      . bisacodyl (DULCOLAX) suppository 10 mg  10 mg Rectal Daily PRN Nadara Mustard, MD      . carvedilol (COREG) tablet 6.25 mg  6.25 mg Oral BID WC Calvert Cantor, MD   6.25 mg at 04/13/17 1657  . Darbepoetin Alfa (ARANESP) injection 200 mcg  200 mcg Intravenous Q Thu-HD Delano Metz, MD   200 mcg at 04/09/17 1707  . dextrose 50 % solution 50 mL  1 ampule Intravenous Once Abrol, Germain Osgood, MD      . docusate sodium (COLACE) capsule 100 mg  100 mg Oral BID Nadara Mustard, MD   100 mg at 04/11/17 1100  . DULoxetine (CYMBALTA) DR capsule 30 mg  30 mg Oral Daily Calvert Cantor, MD   30 mg at 04/13/17 0928  . feeding supplement (PRO-STAT SUGAR FREE 64) liquid 30 mL  30 mL Oral TID Efrain Sella, MD   30 mL at 04/13/17 2149  . fluconazole (DIFLUCAN) tablet 100 mg  100 mg Oral Daily Calvert Cantor, MD   100 mg at 04/13/17 0929  . [name]'s butt cream   Topical QID Maxie Barb, MD      . heparin injection 1,000 Units  1,000 Units Dialysis PRN Bufford Buttner, MD      . heparin injection 5,000 Units  5,000 Units Subcutaneous Q8H Marcos Eke, PA-C    5,000 Units at 04/14/17 1314  . insulin aspart (novoLOG) injection 0-15 Units  0-15 Units Subcutaneous TID WC Calvert Cantor, MD   5 Units at 04/13/17 1200  . insulin glargine (LANTUS) injection 8 Units  8 Units Subcutaneous Daily Richarda Overlie, MD   8 Units at 04/13/17 0928  . lidocaine (PF) (XYLOCAINE) 1 % injection 5 mL  5 mL Intradermal PRN Bufford Buttner, MD      . lidocaine-prilocaine (EMLA) cream 1 application  1 application Topical PRN Bufford Buttner, MD      . magnesium citrate solution 1 Bottle  1 Bottle Oral Once PRN Nadara Mustard, MD      .  MEDLINE mouth rinse  15 mL Mouth Rinse BID Calvert Cantor, MD      . multivitamin (RENA-VIT) tablet 1 tablet  1 tablet Oral Vernetta Honey, MD   1 tablet at 04/13/17 2148  . nystatin (MYCOSTATIN) 100000 UNIT/ML suspension 500,000 Units  5 mL Oral QID Calvert Cantor, MD   500,000 Units at 04/14/17 1313  . ondansetron (ZOFRAN) tablet 4 mg  4 mg Oral Q6H PRN Nadara Mustard, MD       Or  . ondansetron [facility]) injection 4 mg  4 mg Intravenous Q6H PRN Nadara Mustard, MD   4 mg at 04/12/17 0926  . pentafluoroprop-tetrafluoroeth (GEBAUERS) aerosol 1 application  1 application Topical PRN Bufford Buttner, MD      . polyethylene glycol (MIRALAX / GLYCOLAX) packet 17 g  17 g Oral Daily PRN Nadara Mustard, MD      . RESOURCE THICKENUP CLEAR   Oral PRN Osvaldo Shipper, MD         Discharge Medications: Please see discharge summary for a list of discharge medications.  Relevant Imaging Results:  Relevant Lab Results:   Additional Information SSN: [id number], dialysis pt- scheduled undetermined at this time  Burna Sis, LCSW

## 2017-04-14 NOTE — Progress Notes (Signed)
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   Admitted on 02/14/17 to [facility] with left foot/leg pain, wound dehiscence and purulent drainage from the left trans-metatarsal amputation site.   She was subsequently found to be unresponsive on 5/19. Noted to bein asystole and CPR started with ROSC in approximately 9 minutes. She was intubated by anaesthesia, transferred to the intensive care unit and subsequently underwent cooling protocol. She was unable to be liberated off the ventilator and as a result underwent tracheostomy on 5/31.  Neurological/ Cognitive status has been poor. She has been evaluated by neurology on 5/29 and, per neurology, chances of meaningful neurological recovery appeared to be dismal.  Hospital course complicated by persistent leukocytosis, respiratory failure and left transmetatarsal wound dehiscence with gangrene. 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  She has subsequently had recurrent aspiration pneumonias.  CXR on 6/29 showed a RLL infiltrate.  Subjective: Evaluated during dialysis. No complaints.    Assessment & Plan: Acute /chronichypoxemic vent dependent respiratory failure in a setting of cardiac arrest Aspiration pneumonia- RLL  Tracheostomy  - Intubated 6/19-6/20.  5/31- tracheostomy- - PCCM following  - S/P MBS 6/23. Now status post FEES 6/27. Patient started on nectar thick liquids and dysphagia 1 diet 6/27. Holding off on PEG   - has had aspiration issues- recurrent aspiration on 6/28-  Repeat chest x-ray on 6/29 showed new right-sided infiltrate - She was started on Fortaz on 6/29 and changed  over to Zosyn on 6/30   - Chest x-ray from 7/1 showed improving opacity in the right lung. 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  - Orthopedics removed wound VAC 6/20 - revision of her left AKA  04/03/17   - currently with wound vac - spoke with Dr [name] today -  OK to remove now and cover with a dry dressing- have placed the order  Right heel eschar - wound care RN following - Prevalon boot  Ulcers on sacral area- incontinence associated dermatitis - WOC >> Cleanse and apply Purple Top Criticaid Cream BID and PRN incontinence of urine or stool - also applying barrier cream - air mattress    Diabetes mellitus 2 - cont Lantus 8 U and SSI with meals  Thrush - started Nystatin and Diflucan 7/11- will d/c today.   Anoxic brain injury secondary to Cardiac arrest on 5/19: - slow cognitive improvement- following commands, talking & feeding herself now  Diarrhea  - resolved    Normocytic anemia Likely secondary to chronic disease-probably worsened by acute illness. No signs of bleeding, hemoglobin has  been stable with some fluctuations.   Diabetes mellitus type 2  Continue with SSI. On Lantus. HbA1c 5.9.   Chronic systolic heart failure/nonischemic cardiomyopathy - Echocardiogram>> EF 25-30% with diffuse hypokinesis - volume management with dialysis - has  been unable to tolerate Coreg due to hypotension  - resumed as BP rising  Hypotension/ HTN - Midodrine with dialysis to be discontinued today due to resolution of hypotension - using Coreg now    Pericardial effusion Seen on TEE- no evidence of tamponade pathophysiology. Repeat echo 6/26 with no significant pericardial effusion.   Moderate protein calorie malnutrition - taking orals well now- cont Prostat  Depression?  - has been on Cymbalta at home which was being held-   resumed on 7/13  Dysphagia  - D 2 diet with nectar thick liquids    DVT prophylaxis: Heparin Code Status: Full code Family Communication: mother  Disposition Plan: to be determined- difficult to place in facility due to [facility] and dialysis Consultants:  Nephrology, pulmonary, palliative care, ID, Critical care, Ortho Procedures:  Transthoracic echocardiogram 6/28 Study Conclusions  - Left ventricle: The cavity size was normal. There was mild concentric hypertrophy. Systolic function was severely reduced. The estimated ejection fraction was in the range of 25% to 30%. Diffuse hypokinesis. Regional wall motion abnormalities cannot be excluded. - Mitral valve: There was mild regurgitation. - Pericardium, extracardiac: A trivial pericardial effusion was identified circumferential to the heart.  Left above-knee amputation. 6/15  Antimicrobials:  Anti-infectives    Start     Dose/Rate Route Frequency Ordered Stop   04/13/17 1000  fluconazole (DIFLUCAN) tablet 100 mg     100 mg Oral Daily 04/12/17 1356 04/15/17 0959   04/08/17 1000  fluconazole (DIFLUCAN) IVPB 100 mg  Status:  Discontinued     100 mg 50 mL/hr over 60 Minutes Intravenous Every 24 hours 04/08/17 0851 04/12/17 1356   04/04/17 0600  ceFAZolin (ANCEF) IVPB 2g/100 mL premix  Status:  Discontinued     2 g 200 mL/hr over 30 Minutes Intravenous On call to O.R. 04/03/17 1840 04/03/17 1848   03/28/17 1600  cefTAZidime (FORTAZ) 1 g in  dextrose 5 % 50 mL IVPB  Status:  Discontinued     1 g 100 mL/hr over 30 Minutes Intravenous Every 24 hours 03/27/17 0814 03/28/17 0752   03/28/17 1100  piperacillin-tazobactam (ZOSYN) IVPB 3.375 g     3.375 g 12.5 mL/hr over 240 Minutes Intravenous Every 12 hours 03/28/17 1038 04/11/17 1359   03/27/17 0815  cefTAZidime (FORTAZ) 1 g in dextrose 5 % 50 mL IVPB     1 g 100 mL/hr over 30 Minutes Intravenous  Once 03/27/17 0814 03/27/17 1026   03/17/17 1800  cefTAZidime (FORTAZ) 2 g in dextrose 5 % 50 mL IVPB  Status:  Discontinued     2 g 100 mL/hr over 30 Minutes Intravenous Every T-Th-Sa (1800) 03/15/17 1143 03/18/17 1223   03/17/17 1200  vancomycin (VANCOCIN) IVPB 1000 mg/200 mL premix  Status:  Discontinued     1,000 mg 200 mL/hr over 60 Minutes Intravenous Every T-Th-Sa (Hemodialysis) 03/15/17 1143 03/18/17 1223   03/14/17 1200  vancomycin (VANCOCIN) IVPB 1000 mg/200 mL premix  Status:  Discontinued     1,000 mg 200 mL/hr over 60 Minutes Intravenous Every T-Th-Sa (Hemodialysis) 03/12/17 0912 03/14/17 1542   03/13/17 1430  ceFAZolin (ANCEF) IVPB 2g/100 mL premix  Status:  Discontinued     2 g 200 mL/hr over 30 Minutes Intravenous To [facility] 03/12/17  1208 03/13/17 1113   03/13/17 1115  ceFAZolin (ANCEF) IVPB 2g/100 mL premix     2 g 200 mL/hr over 30 Minutes Intravenous To Surgery 03/13/17 1108 03/13/17 1506   03/12/17 1800  cefTAZidime (FORTAZ) 2 g in dextrose 5 % 50 mL IVPB  Status:  Discontinued     2 g 100 mL/hr over 30 Minutes Intravenous Every T-Th-Sa (1800) 03/12/17 0912 03/14/17 1542   03/12/17 1200  vancomycin (VANCOCIN) 1,500 mg in sodium chloride 0.9 % 250 mL IVPB     1,500 mg 250 mL/hr over 60 Minutes Intravenous Every Thu (Hemodialysis) 03/12/17 0912 03/12/17 1356   03/02/17 1200  cefTRIAXone (ROCEPHIN) 2 g in dextrose 5 % 50 mL IVPB     2 g 100 mL/hr over 30 Minutes Intravenous Every 24 hours 03/02/17 0810 03/06/17 1358   03/02/17 1200  vancomycin (VANCOCIN)  IVPB 1000 mg/200 mL premix     1,000 mg 200 mL/hr over 60 Minutes Intravenous Every M-W-F (Hemodialysis) 03/02/17 0815 03/02/17 1457   03/02/17 1200  metroNIDAZOLE (FLAGYL) IVPB 500 mg     500 mg 100 mL/hr over 60 Minutes Intravenous Every 8 hours 03/02/17 0948 03/06/17 2230   02/26/17 1200  vancomycin (VANCOCIN) IVPB 1000 mg/200 mL premix     1,000 mg 200 mL/hr over 60 Minutes Intravenous Every T-Th-Sa (Hemodialysis) 02/25/17 1151 03/05/17 1325   02/25/17 1200  vancomycin (VANCOCIN) 2,000 mg in sodium chloride 0.9 % 500 mL IVPB     2,000 mg 250 mL/hr over 120 Minutes Intravenous  Once 02/25/17 1149 02/25/17 1449   02/25/17 0900  cefTRIAXone (ROCEPHIN) 2 g in dextrose 5 % 50 mL IVPB  Status:  Discontinued     2 g 100 mL/hr over 30 Minutes Intravenous Every 24 hours 02/25/17 0814 03/02/17 0810   02/25/17 0900  metroNIDAZOLE (FLAGYL) IVPB 500 mg  Status:  Discontinued     500 mg 100 mL/hr over 60 Minutes Intravenous Every 8 hours 02/25/17 0814 03/02/17 0948   02/20/17 1200  vancomycin (VANCOCIN) IVPB 1000 mg/200 mL premix  Status:  Discontinued     1,000 mg 200 mL/hr over 60 Minutes Intravenous Every M-W-F (Hemodialysis) 02/19/17 1506 02/20/17 1403   02/17/17 1800  meropenem (MERREM) 500 mg in sodium chloride 0.9 % 50 mL IVPB  Status:  Discontinued     500 mg 100 mL/hr over 30 Minutes Intravenous Daily-1800 02/17/17 1026 02/25/17 0814   02/17/17 1200  vancomycin (VANCOCIN) IVPB 1000 mg/200 mL premix     1,000 mg 200 mL/hr over 60 Minutes Intravenous Every T-Th-Sa (Hemodialysis) 02/14/17 1758 02/19/17 1612   02/14/17 2200  clindamycin (CLEOCIN) IVPB 900 mg  Status:  Discontinued     900 mg 100 mL/hr over 30 Minutes Intravenous Every 8 hours 02/14/17 1106 02/14/17 1928   02/14/17 2000  clindamycin (CLEOCIN) IVPB 900 mg  Status:  Discontinued     900 mg 100 mL/hr over 30 Minutes Intravenous Every 8 hours 02/14/17 1928 02/16/17 1047   02/14/17 1915  piperacillin-tazobactam (ZOSYN) IVPB  3.375 g  Status:  Discontinued     3.375 g 100 mL/hr over 30 Minutes Intravenous Every 12 hours 02/14/17 1801 02/17/17 1026   02/14/17 1845  vancomycin (VANCOCIN) 2,000 mg in sodium chloride 0.9 % 500 mL IVPB     2,000 mg 250 mL/hr over 120 Minutes Intravenous  Once 02/14/17 1758 02/14/17 2107   02/14/17 1115  clindamycin (CLEOCIN) IVPB 900 mg  Status:  Discontinued     900 mg  100 mL/hr over 30 Minutes Intravenous  Once 02/14/17 1106 02/14/17 2236   02/14/17 0945  clindamycin (CLEOCIN) IVPB 600 mg     600 mg 100 mL/hr over 30 Minutes Intravenous  Once 02/14/17 0931 02/14/17 1013       Objective: Vitals:   04/14/17 1130 04/14/17 1200 04/14/17 1225 04/14/17 1230  BP: (!) 134/91 (!) 141/92 (!) 148/94 137/84  Pulse: (!) 115 (!) 113 (!) 113 (!) 110  Resp: 15 16  18   Temp:   98.1 F (36.7 C) 98.1 F (36.7 C)  TempSrc:   Oral Oral  SpO2:   98% 96%  Weight:      Height:        Intake/Output Summary (Last 24 hours) at 04/14/17 1538 Last data filed at 04/14/17 1230  Gross per 24 hour  Intake                0 ml  Output                0 ml  Net                0 ml   Filed Weights   04/12/17 0500 04/13/17 0427 04/14/17 0500  Weight: 79 kg (174 lb 2.6 oz) 79 kg (174 lb 2.6 oz) 80 kg (176 lb 5.9 oz)    Examination: General exam: Appears comfortable -  HEENT: PERRLA, oral mucosa moist Respiratory system: Clear to auscultation. Respiratory effort normal. Cardiovascular system: S1 & S2 heard, RRR.  No murmurs tachycardic Gastrointestinal system: Abdomen soft, non-tender, nondistended. Normal bowel sound. No organomegaly Central nervous system: Alert - moving arms and legs - normal tone in extermities Extremities: No cyanosis, clubbing or edema- left AKA with wound vac, prior right transmetatarsal amputation Skin: multiple ulcers on sacral area- no cellulitis- right heel eschar   Data Reviewed: I have personally reviewed following labs and imaging studies  CBC:  Recent  Labs Lab 04/08/17 0333 04/09/17 0429 04/11/17 0341 04/13/17 1426 04/14/17 0730  WBC 13.4* 11.7* 10.1 9.7 9.2  HGB 7.9* 8.1* 8.3* 9.1* 8.9*  HCT 28.2* 28.3* 29.0* 32.5* 31.0*  MCV 98.9 97.9 98.0 102.2* 100.0  PLT 472* 469* 397 434* 483*   Basic Metabolic Panel:  Recent Labs Lab 04/08/17 0333 04/09/17 0429 04/11/17 0341 04/13/17 1426 04/14/17 0730  NA 137 138 135 139 141  K 4.5 4.6 3.9 4.5 4.4  CL 97* 97* 94* 99* 100*  CO2 27 27 28 27 29   GLUCOSE 99 110* 104* 110* 116*  BUN 29* 48* 33* 36* 47*  CREATININE 3.61* 5.16* 4.12* 4.27* 5.00*  CALCIUM 8.7* 9.0 9.2 9.4 9.5  PHOS  --   --   --  4.6 4.8*   GFR: Estimated Creatinine Clearance: 17.3 mL/min (A) (by C-G formula based on SCr of 5 mg/dL (H)). Liver Function Tests:  Recent Labs Lab 04/08/17 0333 04/13/17 1426 04/14/17 0730  AST 19  --   --   ALT 12*  --   --   ALKPHOS 226*  --   --   BILITOT 1.1  --   --   PROT 6.9  --   --   ALBUMIN 2.2* 2.5* 2.4*   No results for input(s): LIPASE, AMYLASE in the last 168 hours. No results for input(s): AMMONIA in the last 168 hours. Coagulation Profile: No results for input(s): INR, PROTIME in the last 168 hours. Cardiac Enzymes: No results for input(s): CKTOTAL, CKMB, CKMBINDEX, TROPONINI in the last 168  hours. BNP (last 3 results) No results for input(s): PROBNP in the last 8760 hours. HbA1C: No results for input(s): HGBA1C in the last 72 hours. CBG:  Recent Labs Lab 04/13/17 0753 04/13/17 1134 04/13/17 1641 04/14/17 0758 04/14/17 1221  GLUCAP 116* 222* 73 114* 100*   Lipid Profile: No results for input(s): CHOL, HDL, LDLCALC, TRIG, CHOLHDL, LDLDIRECT in the last 72 hours. Thyroid Function Tests: No results for input(s): TSH, T4TOTAL, FREET4, T3FREE, THYROIDAB in the last 72 hours. Anemia Panel: No results for input(s): VITAMINB12, FOLATE, FERRITIN, TIBC, IRON, RETICCTPCT in the last 72 hours. Urine analysis:    Component Value Date/Time   COLORURINE  AMBER (A) 02/14/2017 2111   APPEARANCEUR HAZY (A) 02/14/2017 2111   LABSPEC 1.015 02/14/2017 2111   PHURINE 5.0 02/14/2017 2111   GLUCOSEU NEGATIVE 02/14/2017 2111   HGBUR SMALL (A) 02/14/2017 2111   BILIRUBINUR NEGATIVE 02/14/2017 2111   KETONESUR NEGATIVE 02/14/2017 2111   PROTEINUR 30 (A) 02/14/2017 2111   UROBILINOGEN 0.2 09/21/2014 1908   NITRITE NEGATIVE 02/14/2017 2111   LEUKOCYTESUR TRACE (A) 02/14/2017 2111   Sepsis Labs: @LABRCNTIP (procalcitonin:4,lacticidven:4) )No results found for this or any previous visit (from the past 240 hour(s)).       Radiology Studies: No results found.    Scheduled Meds: . carvedilol  6.25 mg Oral BID WC  . darbepoetin (ARANESP) injection - DIALYSIS  200 mcg Intravenous Q Thu-HD  . dextrose  1 ampule Intravenous Once  . docusate sodium  100 mg Oral BID  . DULoxetine  30 mg Oral Daily  . feeding supplement (PRO-STAT SUGAR FREE 64)  30 mL Oral TID  . fluconazole  100 mg Oral Daily  . [name]'s butt cream   Topical QID  . heparin  5,000 Units Subcutaneous Q8H  . insulin aspart  0-15 Units Subcutaneous TID WC  . insulin glargine  8 Units Subcutaneous Daily  . mouth rinse  15 mL Mouth Rinse BID  . multivitamin  1 tablet Oral QHS  . nystatin  5 mL Oral QID   Continuous Infusions: . sodium chloride    . sodium chloride       LOS: 59 days    Time spent in minutes: 35    Calvert Cantor, MD [facility] Pager: [email or web address] Password [facility] 04/14/2017, 3:38 PM

## 2017-04-14 NOTE — Progress Notes (Signed)
Patient ID: [name], female   DOB: October 15, 1980, 35 y.o.   MRN: [id number]  This NP visited patient at the bedside for follow-up  for palliative needs.  Patient continues to slowly improve.  [name] has been de-cannulated.   Plan is SNF for rehab.  Placed calll to sister/Tymeka # [phone]  in [place] to offer update and  support  No questions and concerns today.  PMT will continue to support holistically.  I will f/u on Monday if still hospitalized.  Family knows to call with questions or concerns.   No charge  [name] NP  Palliative Medicine Team Team Phone # [phone] Pager [phone]

## 2017-04-14 NOTE — Progress Notes (Signed)
Wound vac discontinued per orders. Dry dressing applied per order. Staples CDI.

## 2017-04-15 LAB — GLUCOSE, CAPILLARY
GLUCOSE-CAPILLARY: 193 mg/dL — AB (ref 65–99)
Glucose-Capillary: 154 mg/dL — ABNORMAL HIGH (ref 65–99)
Glucose-Capillary: 130 mg/dL — ABNORMAL HIGH (ref 65–99)
Glucose-Capillary: 100 mg/dL — ABNORMAL HIGH (ref 65–99)

## 2017-04-15 NOTE — Progress Notes (Signed)
[facility] Rounding Note  Subjective:  No acute events.  Continues to improve.    Vital signs in last 24 hours: Vitals:   04/15/17 0150 04/15/17 0400 04/15/17 0405 04/15/17 0732  BP: (!) 109/55 (!) 109/56  (!) 92/42  Pulse: (!) 113  (!) 134 (!) 120  Resp: (!) 22 (!) 21 (!) 27   Temp:  98.6 F (37 C)    TempSrc:  Oral    SpO2: 98%  97% 92%  Weight:  76 kg (167 lb 8.8 oz)    Height:  5' 8.5" (1.74 m)     Weight change: -4 kg (-8 lb 13.1 oz)  Intake/Output Summary (Last 24 hours) at 04/15/17 1114 Last data filed at 04/14/17 2230  Gross per 24 hour  Intake              200 ml  Output               50 ml  Net              150 ml   Physical Exam: GEN Alert, NAD HEENT: prior trach site healing PULM: normal WOB, clear bilaterally CV tachycardic  ABD obese, soft and non tender EXT: Left AKA dressed, no edema, Right foot in protective boot Dialysis Access: Left AVF + bruit and thrill  Dialysis: TTS [facility] 4h   F200  114kg   2/2 bath  LUA AVF  Hep none - Iron Sucrose (Venofer) 50 mg IVP During Dialysis 1X Week  - Mircera 75 mcg IV q  2 weeks ( last on 02/05/17) - Vitamin D (Calcitriol) Oral 0.75 mcg  Po q hd   Summary: 36 year old female w/ ESRD d/t poorly controlled DM, HD bilateral TMA's, obesity, HTN, medical non-compliance, NICM. Admitted 5/19 w/left leg pain, + radiological evidence of osteomyelitis w/wound nonhealing, dehiscence, purulent drainage. Admitted for IV antibiotics, hemodialysis and possible surgery. Found unresponsive in her room that afternoon - cardiac arrest > rec'd  CPR, epi bicarb; intubated; cooled. Now with anoxic brain injury, trach and s/p L AKA this hosp. Course further complicated by recurrent aspiration PNA, dehiscence of AK wound requiring revision 7/6.   Assessment/ Plan: 1. Anoxic brain injury sp arrest - pt awake, speaking, eating now. Improved.  2. Trach - s/p decannulation 7/16. 3. Fever/ PNA /^WBC - completed abx 7/12.  Resolved. 4. ESRD -  TTS HD,  started HD June '17. Continue normal schedule. 5. HTN - BP's up the last 48 hrs, on low dose Coreg. Have d/c'd midodrine 6. Anemia- on max aranesp- hgb stable in 8's.  Repeat iron panel pending 7. MBD of CKD - phos in range, Ca++ creeping up, will dc CaCO3. PTH 88 here.   8. 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   9. DM2 - per primary 10. sHF - EF 25-30% contributes to BP issues on HD 11. Nutrition- unclear if will be able to take enough in to meet needs, albumin 2.2.  Taking prostat. 12. Dispo- difficult with ESRD and trach- [place] Medicaid so not sure LTAC is option. Since she's decannulated that makes it easier for dispo.     Bufford Buttner MD [facility] pgr [phone]    Recent Labs Lab 04/11/17 0341 04/13/17 1426 04/14/17 0730  NA 135 139 141  K 3.9 4.5 4.4  CL 94* 99* 100*  CO2 28 27 29   GLUCOSE 104* 110* 116*  BUN 33* 36* 47*  CREATININE 4.12* 4.27*  5.00*  CALCIUM 9.2 9.4 9.5  PHOS  --  4.6 4.8*    Recent Labs Lab 04/13/17 1426 04/14/17 0730  ALBUMIN 2.5* 2.4*    Recent Labs Lab 04/09/17 0429 04/11/17 0341 04/13/17 1426 04/14/17 0730  WBC 11.7* 10.1 9.7 9.2  HGB 8.1* 8.3* 9.1* 8.9*  HCT 28.3* 29.0* 32.5* 31.0*  MCV 97.9 98.0 102.2* 100.0  PLT 469* 397 434* 483*     Recent Labs Lab 04/14/17 0758 04/14/17 1221 04/14/17 1644 04/14/17 2316 04/15/17 0737  GLUCAP 114* 100* 117* 154* 130*    Medications: Infusions: . sodium chloride    . sodium chloride      Scheduled Medications: . carvedilol  6.25 mg Oral BID WC  . darbepoetin (ARANESP) injection - DIALYSIS  200 mcg Intravenous Q Thu-HD  . dextrose  1 ampule Intravenous Once  . docusate sodium  100 mg Oral BID  . DULoxetine  30 mg Oral Daily  . feeding supplement (PRO-STAT SUGAR FREE 64)  30 mL Oral TID  . [name]'s butt cream   Topical QID  . heparin  5,000 Units Subcutaneous Q8H  . insulin aspart  0-15 Units Subcutaneous TID WC  . insulin glargine  8 Units  Subcutaneous Daily  . mouth rinse  15 mL Mouth Rinse BID  . multivitamin  1 tablet Oral QHS

## 2017-04-15 NOTE — Progress Notes (Signed)
CSW provided bed offers to pt and pt mom- they are interested in [facility] liaison to visit pt and pt mom in room to provide information  CSW will continue to follow  Burna Sis, LCSW Clinical Social Worker [phone]

## 2017-04-15 NOTE — Progress Notes (Signed)
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   Admitted on 02/14/17 to [facility] with left foot/leg pain, wound dehiscence and purulent drainage from the left trans-metatarsal amputation site.   She was subsequently found to be unresponsive on 5/19. Noted to bein asystole and CPR started with ROSC in approximately 9 minutes. She was intubated by anaesthesia, transferred to the intensive care unit and subsequently underwent cooling protocol. She was unable to be liberated off the ventilator and as a result underwent tracheostomy on 5/31.  Neurological/ Cognitive status has been poor. She has been evaluated by neurology on 5/29 and, per neurology, chances of meaningful neurological recovery appeared to be dismal.  Hospital course complicated by persistent leukocytosis, respiratory failure and left transmetatarsal wound dehiscence with gangrene. 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  Over the course of her hospitalizations, she has been treated for multiple episodes of recurrent aspiration pneumonias.   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)   Diabetic osteomyelitis (HCC)   Dehiscence of amputation stump (HCC)   Osteomyelitis  (HCC)   Chronic pain syndrome   Cardiac arrest, cause unspecified (HCC)   Pressure injury of skin   Wound dehiscence   Coma (HCC)   Elevated liver enzymes   Jaundice   Encounter for PEG (percutaneous endoscopic gastrostomy) (HCC)   Protein calorie malnutrition (HCC)   DNR (do not resuscitate) discussion   Palliative care by specialist   Tachycardia   Goals of care, counseling/discussion   Anoxic brain injury [facility])   Palliative care encounter   Tracheostomy status (HCC)   Abscess of left leg   Hypoxemia   Fever   Aspiration into airway   Acute on chronic respiratory failure, in the setting of cardiac arrest with superimposed recurrent aspiration pneumonias during her hospitalization.  S/p intubation on 5/19 and extubated 5/31, tracheostomy, and de cannulated on 7/16.  - currently on RA, and off antibiotics.  - PEG on hold and on dysphagia diet and mom at bedside, reports good appetite.    ESRD on HD  TTS per nephrology.    Left trans metatarsal stump wound dehiscence with infection and gangrene: - underwent AKA, because of wound dehiscence and osteomyelitis. She underwent revision of the AKA on 7/6.  - dry dressings as needed.    Right heel eschar/ pressure ulcers on the sacral area:  Wound care consulted and recommendations given.   Type 2 DM:  CBG (last 3)   Recent Labs  04/14/17 1644 04/14/17 2316 04/15/17 0737  GLUCAP 117* 154* 130*    Resume SSI and lantus of 8 units.    Anoxic brain injury:  Slow cognitive improvement  Following some commands.    Normocytic anemia:  Anemia of chronic disease worsened by acute illness .  Hemoglobin stable around  8.    NICM, WITH chronic systolic heart failure:  She appears to be compensated.  Fluid management with HD.  Resume coreg. Hold for SBP<90MMHG.    Pericardial effusion without evidence of tamponade physiology.   Moderate protein cal malnutrition:  Dietary consulted and recommendations given.    Depression;  Resumed home cymbalta.    Dysphagia:  On dys 2 diet with nectar thick liquids.       DVT prophylaxis: (Lovenox/Heparin/SCD's/anticoagulated/None (if comfort care) Code Status: (Full/Partial - specify details) Family Communication: (Specify name, relationship & date discussed. NO "discussed with patient") Disposition Plan: (specify when and where you expect patient to be discharged). Include barriers to DC in this tab.   Consultants:   PCCM as needed  Nephrology  Palliative care consult   Orthopedics,    Procedures: STUDIES:  Lower extremity ultrasound 5/18 >> no evidence of DVT CT head 5/19 >> normal exam Echo 5/20 >> EF 30-35%, increased LVF. Diffuse hypokinesis, akinesis of basilar mid-inferior myocardium, grade 2 diastolic dysfxn  EEG 5/20 >> finding c/w mod to severe global cerebral dysfxn. C/w anoxic injury given clinical course  LE [place] 5/21 >> negative MRI brain 5/24 >> ?thrombosed cortical vein, otherwise normal TEE 6/4 >> mild MR, normal AV, mild TR, mild PR, EF 30-35%, diffuse hypokinesis, no thrombus, no PFO, normal RV, moderate pericardial effusion with synechia suggesting some chronicity, no vegetations   Antimicrobials: Clinidamycin 5/19 >> 5/21 Vancomycin 5/19 >> 5/25 >> restart 5/30 Zosyn 5/19 >> 5/22 Meropenem 5/22 >> 5/30 Flagyl 5/30 >> off Ceftriaxone 5/30 >> 6/4  SIGNIFICANT EVENTS: 5/19  Admit, cardiac arrest, transfer to ICU; hypothermia protocol started 5/24  Off pressors.  MRI w/out evidence of ischemia  6/03  Tolerated ATC couple hours, then back to vent, HD  7/11  Trach capped  7/16  On RA.  Trach dislodged over the weekend, replaced without difficulty. Decannulated late PM  LINES/TUBES: ETT 5/19 >> 5/31 Lt IJ CVL 5/19 >> out Trach 5/31 >> 7/16  Subjective: Does not appear to be in distress, . Mother at bedside requesting for letters to be submitted to insurance .   Objective: Vitals:   04/15/17 0150 04/15/17 0400  04/15/17 0405 04/15/17 0732  BP: (!) 109/55 (!) 109/56  (!) 92/42  Pulse: (!) 113  (!) 134 (!) 120  Resp: (!) 22 (!) 21 (!) 27   Temp:  98.6 F (37 C)    TempSrc:  Oral    SpO2: 98%  97% 92%  Weight:  76 kg (167 lb 8.8 oz)    Height:  5' 8.5" (1.74 m)      Intake/Output Summary (Last 24 hours) at 04/15/17 1015 Last data filed at 04/14/17 2230  Gross per 24 hour  Intake              200 ml  Output               50 ml  Net              150 ml   Filed Weights   04/13/17 0427 04/14/17 0500 04/15/17 0400  Weight: 79 kg (174 lb 2.6 oz) 80 kg (176 lb 5.9 oz) 76 kg (167 lb 8.8 oz)    Examination:  General exam: Appears calm and comfortable  Respiratory system: Clear to auscultation. Respiratory effort normal. Cardiovascular system: S1 & S2 heard, no murmer, tachycardic.   Gastrointestinal system: Abdomen is nondistended, soft and nontender. No organomegaly or masses felt. Normal bowel  sounds heard. Central nervous system:alert but not oriented. Able to move all extremities.  Extremities: left Aka. Normal tone.  Skin: sacral area ulcer, right heel ischar.  Psychiatry: cannot be assessed in detail. She is calm without any agitation.    Data Reviewed: I have personally reviewed following labs and imaging studies  CBC:  Recent Labs Lab 04/09/17 0429 04/11/17 0341 04/13/17 1426 04/14/17 0730  WBC 11.7* 10.1 9.7 9.2  HGB 8.1* 8.3* 9.1* 8.9*  HCT 28.3* 29.0* 32.5* 31.0*  MCV 97.9 98.0 102.2* 100.0  PLT 469* 397 434* 483*   Basic Metabolic Panel:  Recent Labs Lab 04/09/17 0429 04/11/17 0341 04/13/17 1426 04/14/17 0730  NA 138 135 139 141  K 4.6 3.9 4.5 4.4  CL 97* 94* 99* 100*  CO2 27 28 27 29   GLUCOSE 110* 104* 110* 116*  BUN 48* 33* 36* 47*  CREATININE 5.16* 4.12* 4.27* 5.00*  CALCIUM 9.0 9.2 9.4 9.5  PHOS  --   --  4.6 4.8*   GFR: Estimated Creatinine Clearance: 15.8 mL/min (A) (by C-G formula based on SCr of 5 mg/dL (H)). Liver Function Tests:  Recent  Labs Lab 04/13/17 1426 04/14/17 0730  ALBUMIN 2.5* 2.4*   No results for input(s): LIPASE, AMYLASE in the last 168 hours. No results for input(s): AMMONIA in the last 168 hours. Coagulation Profile: No results for input(s): INR, PROTIME in the last 168 hours. Cardiac Enzymes: No results for input(s): CKTOTAL, CKMB, CKMBINDEX, TROPONINI in the last 168 hours. BNP (last 3 results) No results for input(s): PROBNP in the last 8760 hours. HbA1C: No results for input(s): HGBA1C in the last 72 hours. CBG:  Recent Labs Lab 04/13/17 1641 04/14/17 0758 04/14/17 1221 04/14/17 1644 04/15/17 0737  GLUCAP 73 114* 100* 117* 130*   Lipid Profile: No results for input(s): CHOL, HDL, LDLCALC, TRIG, CHOLHDL, LDLDIRECT in the last 72 hours. Thyroid Function Tests: No results for input(s): TSH, T4TOTAL, FREET4, T3FREE, THYROIDAB in the last 72 hours. Anemia Panel: No results for input(s): VITAMINB12, FOLATE, FERRITIN, TIBC, IRON, RETICCTPCT in the last 72 hours. Sepsis Labs: No results for input(s): PROCALCITON, LATICACIDVEN in the last 168 hours.  No results found for this or any previous visit (from the past 240 hour(s)).       Radiology Studies: No results found.      Scheduled Meds: . carvedilol  6.25 mg Oral BID WC  . darbepoetin (ARANESP) injection - DIALYSIS  200 mcg Intravenous Q Thu-HD  . dextrose  1 ampule Intravenous Once  . docusate sodium  100 mg Oral BID  . DULoxetine  30 mg Oral Daily  . feeding supplement (PRO-STAT SUGAR FREE 64)  30 mL Oral TID  . [name]'s butt cream   Topical QID  . heparin  5,000 Units Subcutaneous Q8H  . insulin aspart  0-15 Units Subcutaneous TID WC  . insulin glargine  8 Units Subcutaneous Daily  . mouth rinse  15 mL Mouth Rinse BID  . multivitamin  1 tablet Oral QHS   Continuous Infusions: . sodium chloride    . sodium chloride       LOS: 60 days    Time spent:35 minutes.     Kathlen Mody, MD [facility] Pager  [phone]  If 7PM-7AM, please contact night-coverage [email or web address] Password [email or web address] 04/15/2017, 10:15 AM

## 2017-04-16 LAB — CBC
HEMATOCRIT: 29.6 % — AB (ref 36.0–46.0)
HEMOGLOBIN: 8.6 g/dL — AB (ref 12.0–15.0)
MCH: 28.5 pg (ref 26.0–34.0)
MCHC: 29.1 g/dL — AB (ref 30.0–36.0)
MCV: 98 fL (ref 78.0–100.0)
Platelets: 399 10*3/uL (ref 150–400)
RBC: 3.02 MIL/uL — ABNORMAL LOW (ref 3.87–5.11)
RDW: 18.4 % — AB (ref 11.5–15.5)
WBC: 9.2 10*3/uL (ref 4.0–10.5)

## 2017-04-16 LAB — IRON AND TIBC
IRON: 56 ug/dL (ref 28–170)
SATURATION RATIOS: 36 % — AB (ref 10.4–31.8)
TIBC: 157 ug/dL — AB (ref 250–450)
UIBC: 101 ug/dL

## 2017-04-16 LAB — RENAL FUNCTION PANEL
ALBUMIN: 2.3 g/dL — AB (ref 3.5–5.0)
ANION GAP: 15 (ref 5–15)
BUN: 51 mg/dL — AB (ref 6–20)
CHLORIDE: 94 mmol/L — AB (ref 101–111)
CO2: 24 mmol/L (ref 22–32)
Calcium: 9.1 mg/dL (ref 8.9–10.3)
Creatinine, Ser: 5 mg/dL — ABNORMAL HIGH (ref 0.44–1.00)
GFR calc Af Amer: 12 mL/min — ABNORMAL LOW (ref >60)
GFR calc non Af Amer: 10 mL/min — ABNORMAL LOW (ref >60)
GLUCOSE: 156 mg/dL — AB (ref 65–99)
PHOSPHORUS: 4.8 mg/dL — AB (ref 2.5–4.6)
POTASSIUM: 4.8 mmol/L (ref 3.5–5.1)
Sodium: 133 mmol/L — ABNORMAL LOW (ref 135–145)

## 2017-04-16 LAB — GLUCOSE, CAPILLARY
GLUCOSE-CAPILLARY: 113 mg/dL — AB (ref 65–99)
GLUCOSE-CAPILLARY: 168 mg/dL — AB (ref 65–99)
Glucose-Capillary: 137 mg/dL — ABNORMAL HIGH (ref 65–99)
Glucose-Capillary: 172 mg/dL — ABNORMAL HIGH (ref 65–99)

## 2017-04-16 LAB — HEPATITIS B SURFACE ANTIGEN: HEP B S AG: NEGATIVE

## 2017-04-16 LAB — FERRITIN: FERRITIN: 1338 ng/mL — AB (ref 11–307)

## 2017-04-16 MED ORDER — SODIUM CHLORIDE 0.9% FLUSH
3.0000 mL | Freq: Two times a day (BID) | INTRAVENOUS | Status: DC
  Administered 2017-04-16 – 2017-04-30 (×22): 3 mL via INTRAVENOUS

## 2017-04-16 MED ORDER — DARBEPOETIN ALFA 200 MCG/0.4ML IJ SOSY
PREFILLED_SYRINGE | INTRAMUSCULAR | Status: AC
  Administered 2017-04-16: 200 ug via INTRAVENOUS
  Filled 2017-04-16: qty 0.4

## 2017-04-16 NOTE — Consult Note (Signed)
Physical Medicine and Rehabilitation Consult   Reason for Consult: ABI due to cardiac arrest,  L-BKA and h/o right transmetatarsal amputation.  Referring Physician: Derinda Late   HPI: [name] is a 36 y.o. 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 She was placed on IV antibiotics with question need for BKA due to concerns of wound healing. She was noted to be septic and was found unresponsive in room requiring ACLS protocol with ROSC in 9 minutes. She was intubated and taken to HD that evening. Hospital course significant for presistent leucocytosis and fever despite antibiotics and ID felt fevers due to leg infection. She was difficulty to extubate and  required tracheostomy and weaned to ATC and decannulated by 7/16.  Patient/Family continued to refuse amputation initially and Dr. Victorino Dike consulted for input. 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 She has had poor wound healing requiring revision of wound 7/6. She is showing improvement in mentation, verbal output and activity tolerance--was able to go to dialysis in recliner chair on Tues. She continues to be limtied by cognitive deficits, L-AKA and debility due to prolonged hospitalization. . MD requesting CIR for follow up therapy.    Review of Systems  Unable to perform ROS: Mental acuity  Respiratory: Negative for cough and shortness of breath.   Cardiovascular: Negative for chest pain and palpitations.  Musculoskeletal: Positive for back pain, joint pain (right knee pain) and myalgias.  Neurological: Positive for weakness. Negative for headaches.  Psychiatric/Behavioral: Positive for memory loss.      Past Medical History:  Diagnosis Date  . Anemia   . Arthritis    "left hand" (09/15/2013)  . Asthma     . CHF (congestive heart failure) (HCC)   . Chronic bronchitis (HCC)    "just about q yr" (09/15/2013)  . Chronic kidney disease    "low kidney function" (09/15/2013) ,  T/Th/Sa  . COPD (chronic obstructive pulmonary disease) (HCC)   . Coronary artery disease   . Hyperlipidemia   . Hypertension   . Migraine    "get them alot" (09/15/2013)  . Myocardial infarction [facility]) 04/2015   NSTEMI  . Normal coronary arteries    by cardiac catheterization 09/20/13  . Peripheral vascular disease (HCC)   . Pneumonia    "couple times; have it now" (09/15/2013)  . PONV (postoperative nausea and vomiting)   . Restless legs   . Shortness of breath    "just recently; related to the pneumonia" (09/15/2013)  . Type 1 diabetes mellitus (HCC)    type 2    Past Surgical History:  Procedure Laterality Date  . AMPUTATION Right 06/25/2016   Procedure: Amputation Right Great Toe at the Metatarsophalangeal Joint;  Surgeon: Nadara Mustard, MD;  Location: [facility] OR;  Service: Orthopedics;  Laterality: Right;  . AMPUTATION Bilateral 10/08/2016   Procedure: Bilateral Transmetatarsal Amputation;  Surgeon: Nadara Mustard, MD;  Location: [facility] OR;  Service: Orthopedics;  Laterality: Bilateral;  . AMPUTATION Left 03/13/2017   Procedure: AMPUTATION ABOVE KNEE;  Surgeon: Nadara Mustard, MD;  Location: [facility] OR;  Service: Orthopedics;  Laterality: Left;  . APPLICATION OF WOUND VAC Left 04/03/2017   Procedure: Application of Wound Vacuum;  Surgeon: Nadara Mustard, MD;  Location: [facility] OR;  Service: Orthopedics;  Laterality: Left;  .  AV FISTULA PLACEMENT Left 03/27/2015   Procedure: CREATION RADIAL CEPHALIC ARTERIOVENOUS FISTULA;  Surgeon: Chuck Hint, MD;  Location: [facility] OR;  Service: Vascular;  Laterality: Left;  . AV FISTULA PLACEMENT Left 11/23/2015   Procedure:  LEFT ARM BASILIC VEIN TRANSPOSITION  ;  Surgeon: Chuck Hint, MD;  Location: [facility] OR;  Service: Vascular;  Laterality: Left;  . CESAREAN SECTION  1999; 2006  .  CORONARY ANGIOGRAM  09/20/2013   Procedure: CORONARY ANGIOGRAM;  Surgeon: Runell Gess, MD;  Location: [facility] CATH LAB;  Service: Cardiovascular;;  . FINGER SURGERY Left 1985   3rd and 4th digits reconstructed after cut off" (09/15/2013)  . IR FLUORO GUIDE CV LINE RIGHT  03/11/2017  . IR US GUIDE VASC ACCESS RIGHT  03/11/2017  . PERIPHERAL VASCULAR CATHETERIZATION N/A 09/05/2016   Procedure: Abdominal Aortogram;  Surgeon: Sherren Kerns, MD;  Location: [facility] INVASIVE CV LAB;  Service: Cardiovascular;  Laterality: N/A;  . PERIPHERAL VASCULAR CATHETERIZATION Bilateral 09/05/2016   Procedure: Lower Extremity Angiography;  Surgeon: Sherren Kerns, MD;  Location: [facility] INVASIVE CV LAB;  Service: Cardiovascular;  Laterality: Bilateral;  . PERIPHERAL VASCULAR CATHETERIZATION Right 09/05/2016   Procedure: Peripheral Vascular Balloon Angioplasty;  Surgeon: Sherren Kerns, MD;  Location: [facility] INVASIVE CV LAB;  Service: Cardiovascular;  Laterality: Right;  peroneal and AT  . SHOULDER ARTHROSCOPY WITH BICEPSTENOTOMY Right 05/10/2015   Procedure: RIGHT SHOULDER ARTHROSCOPY WITH BICEPS TENOTOMY, DEBRIDEMENT LABRAL TEAR;  Surgeon: Jones Broom, MD;  Location: [facility] OR;  Service: Orthopedics;  Laterality: Right;  Right shoulder arthroscopy biceps tenotomy, debridement labral tear  . STUMP REVISION Left 01/21/2017   Procedure: . Revision Left Transmetatarsal Amputation;  Surgeon: Nadara Mustard, MD;  Location: [facility] OR;  Service: Orthopedics;  Laterality: Left;  . STUMP REVISION Left 04/03/2017   Procedure: Revision Left Above Knee Amputation;  Surgeon: Nadara Mustard, MD;  Location: [facility] OR;  Service: Orthopedics;  Laterality: Left;  . TONSILLECTOMY  1997  . TUBAL LIGATION  2006    Family History  Problem Relation Age of Onset  . Diabetes Mother   . Stroke Mother   . Hypertension Father   . Hyperlipidemia Father   . Cancer - Lung Father   . Diabetes Maternal Grandmother   . Cancer Paternal Grandmother     Social  History: Lives with children ( 75 and 63 years old) and mother (had stroke and use a walker) moved in a year ago to help. Sister from out of state helping at this time. Per  reports that she has never smoked. She has never used smokeless tobacco. Per reports that she does not drink alcohol or use drugs.    Allergies  Allergen Reactions  . Aspirin Anaphylaxis  . Sulfur Hives  . Tramadol Hives and Other (See Comments)    Pt states she feels weird   . Gabapentin Other (See Comments)    Lethargic     Medications Prior to Admission  Medication Sig Dispense Refill  . albuterol (PROVENTIL HFA;VENTOLIN HFA) 108 (90 BASE) MCG/ACT inhaler Inhale 2 puffs into the lungs every 6 (six) hours as needed for wheezing or shortness of breath.     Marland Kitchen albuterol (PROVENTIL) (2.5 MG/3ML) 0.083% nebulizer solution Take 2.5 mg by nebulization every 6 (six) hours as needed for wheezing or shortness of breath.    Marland Kitchen atorvastatin (LIPITOR) 40 MG tablet Take 40 mg by mouth at bedtime.    . budesonide (PULMICORT) 0.25 MG/2ML nebulizer solution Take 0.25  mg by nebulization 2 (two) times daily as needed (shortness of breath or wheezing).    . carvedilol (COREG) 25 MG tablet Take 25 mg by mouth 2 (two) times daily.    . collagenase (SANTYL) ointment Apply 1 application topically daily. (Patient taking differently: Apply 1 application topically daily as needed (itching). ) 15 g 0  . diphenhydrAMINE (BENADRYL) 25 MG tablet Take 25 mg by mouth every 6 (six) hours as needed for itching.    . DULoxetine (CYMBALTA) 30 MG capsule Take 1 capsule (30 mg total) by mouth daily. Qam 30 capsule 3  . hydrOXYzine (ATARAX/VISTARIL) 25 MG tablet Take 1 tablet (25 mg total) by mouth every 6 (six) hours. 24 tablet 0  . insulin aspart (NOVOLOG FLEXPEN) 100 UNIT/ML FlexPen Inject 2-15 Units into the skin 3 (three) times daily with meals. Per sliding scale - based on carb count and CBG     . Insulin Glargine (LANTUS SOLOSTAR) 100 UNIT/ML Solostar  Pen Inject 10 Units into the skin at bedtime. (Patient taking differently: Inject 15 Units into the skin at bedtime. ) 15 mL 11  . isosorbide mononitrate (ISMO,MONOKET) 20 MG tablet Take 20 mg by mouth 3 (three) times daily.    . meclizine (ANTIVERT) 25 MG tablet Take 25 mg by mouth 3 (three) times daily as needed for dizziness or nausea.    . multivitamin (RENA-VIT) TABS tablet Take 1 tablet by mouth daily.    Marland Kitchen oxyCODONE (ROXICODONE) 5 MG immediate release tablet Take 1 tablet (5 mg total) by mouth 2 (two) times daily as needed for breakthrough pain. 60 tablet 0  . oxyCODONE-acetaminophen (ROXICET) 5-325 MG tablet Take 1 tablet by mouth every 4 (four) hours as needed for severe pain. 60 tablet 0  . pregabalin (LYRICA) 50 MG capsule Take 1 capsule (50 mg total) by mouth 3 (three) times daily. 90 capsule 1  . sevelamer carbonate (RENVELA) 800 MG tablet Take 3,200-4,000 mg by mouth 3 (three) times daily with meals.     Marland Kitchen tiZANidine (ZANAFLEX) 4 MG tablet Take 4 mg by mouth 3 (three) times daily as needed for muscle spasms.    . [DISCONTINUED] clindamycin (CLEOCIN) 300 MG capsule Take 600 mg by mouth 3 (three) times daily.      Home: Home Living Family/patient expects to be discharged to:: Private residence Living Arrangements: Spouse/significant other, Children Available Help at Discharge: Family, Available 24 hours/day Type of Home: Apartment Home Access: Stairs to enter [employer] of Steps: full flight Entrance Stairs-Rails: Right Home Layout: One level Home Equipment: None Additional Comments: Pt lives with her boyfriend.   Functional History: Prior Function Level of Independence: Independent Comments: chart from 1/18 reports pt ambulating and using her w/c around the house. But now hasn't been up since AKA Functional Status:  Mobility: Bed Mobility Overal bed mobility: Needs Assistance Bed Mobility: Supine to Sit, Sit to Sidelying, Rolling Rolling: Total assist  (increased assist due to lethargy) Sidelying to sit: Max assist, +2 for physical assistance Supine to sit: +2 for physical assistance, Max assist Sit to supine: Max assist, +2 for physical assistance Sit to sidelying: +2 for physical assistance, Mod assist General bed mobility comments: Assist to elevate trunk into sitting and to bring hips to EOB.  Assist to lower trunk and bring leg up into bed. Transfers Overall transfer level: Needs assistance Transfer via Lift Equipment: Maxisky General transfer comment: Pt transferred bed to chair using maxiisky lift. After reaching chair pt needed to have BM so  used maxisky to raise pt and place bedpan. Removed bedpan in same way. BP 80's/60's sitting in recliner      ADL: ADL Overall ADL's : Needs assistance/impaired Eating/Feeding: Moderate assistance, Bed level Eating/Feeding Details (indicate cue type and reason): total A sitting EOB due to not able to maintain balance Grooming: Moderate assistance, Sitting Grooming Details (indicate cue type and reason): With cueing and assist to initiate movement, able to wash her face with mod assist while seated at EOB with assistance for balance.  Upper Body Bathing: Total assistance, Bed level Lower Body Bathing: Total assistance, Bed level Upper Body Dressing : Total assistance, Bed level Lower Body Dressing: Total assistance, Bed level Toileting- Clothing Manipulation and Hygiene: Total assistance, Bed level General ADL Comments: Pt able to sit at EOB to participate in grooming tasks this session.   Cognition: Cognition Overall Cognitive Status: Impaired/Different from baseline Arousal/Alertness: Awake/alert Orientation Level: Oriented to person, Disoriented to time, Oriented to place, Oriented to situation Attention: Focused Focused Attention: Appears intact Sustained Attention: Impaired Sustained Attention Impairment: Verbal basic Behaviors: Restless Cognition Arousal/Alertness:  Lethargic Behavior During Therapy: Flat affect Overall Cognitive Status: Impaired/Different from baseline Area of Impairment: Memory, Following commands, Safety/judgement, Problem solving, Attention Current Attention Level: Sustained Memory: Decreased recall of precautions, Decreased short-term memory Following Commands: Follows one step commands with increased time, Follows one step commands inconsistently Safety/Judgement: Decreased awareness of deficits, Decreased awareness of safety Awareness: Intellectual Problem Solving: Slow processing, Decreased initiation, Difficulty sequencing, Requires tactile cues, Requires verbal cues General Comments: Pt would arouse for periods with stimuli but falling asleep frequently. Difficult to assess due to: Tracheostomy  Blood pressure 131/88, pulse (!) 107, temperature (!) 97.4 F (36.3 C), temperature source Oral, resp. rate 14, height 5' 8.5" (1.74 m), weight 81 kg (178 lb 9.2 oz), SpO2 98 %. Physical Exam  Nursing note and vitals reviewed. Constitutional: She appears well-developed and well-nourished.  HENT:  Head: Normocephalic and atraumatic.  Eyes: Pupils are equal, round, and reactive to light. Conjunctivae and EOM are normal.  Neck: Normal range of motion. Neck supple.  Cardiovascular: Regular rhythm.  Tachycardia present.   Respiratory: Effort normal and breath sounds normal. No stridor. No respiratory distress. She has no wheezes.  GI: Soft. Bowel sounds are normal. She exhibits no distension. There is no tenderness.  Musculoskeletal:  Keeps RLE flexed at the knee. L-AKA with dry dressing.--non-tender to touch.   Neurological: She is alert.  Speaks in childish sing-song voice. Oriented to self and able to state place as "[place]" and DOB. "[birth year]" even when given cues.  Significant cognitive deficits noted--unable to  see calender on the wall or read without cues, difficulty processing and impairments in long and short term memory  noted. Able to follow simple one and two step commands. UE 3+ to 4/5. LE: 3+ to 4/5.   Skin: Skin is warm and dry.  Psychiatric: Her affect is inappropriate. Her speech is delayed and tangential. She is slowed. Cognition and memory are impaired. She expresses inappropriate judgment.    Results for orders placed or performed during the hospital encounter of 02/14/17 (from the past 24 hour(s))  Glucose, capillary     Status: Abnormal   Collection Time: 04/15/17 11:37 AM  Result Value Ref Range   Glucose-Capillary 193 (H) 65 - 99 mg/dL   Comment 1 Notify RN    Comment 2 Document in Chart   Glucose, capillary     Status: Abnormal   Collection Time: 04/15/17  4:38  PM  Result Value Ref Range   Glucose-Capillary 100 (H) 65 - 99 mg/dL   Comment 1 Notify RN    Comment 2 Document in Chart   Glucose, capillary     Status: Abnormal   Collection Time: 04/16/17 12:01 AM  Result Value Ref Range   Glucose-Capillary 137 (H) 65 - 99 mg/dL   No results found.  Assessment/Plan: Diagnosis: anoxic brain injury, severe debility after multiple medical issues and complications, recent left AKA/revision 1. Does the need for close, 24 hr/day medical supervision in concert with the patient's rehab needs make it unreasonable for this patient to be served in a less intensive setting? Potentially 2. Co-Morbidities requiring supervision/potential complications: DM, ESRD, A/Cs/dCHF, chronic pain.  3. Due to bowel management, safety, skin/wound care, disease management, medication administration, pain management and patient education, does the patient require 24 hr/day rehab nursing? Potentially 4. Does the patient require coordinated care of a physician, rehab nurse, PT (1-2 hrs/day, 5 days/week), OT (1-2 hrs/day, 5 days/week) and SLP (1-2 hrs/day, 5 days/week) to address physical and functional deficits in the context of the above medical diagnosis(es)? Yes Addressing deficits in the following areas: balance,  endurance, locomotion, strength, transferring, bowel/bladder control, bathing, dressing, feeding, grooming and psychosocial support 5. Can the patient actively participate in an intensive therapy program of at least 3 hrs of therapy per day at least 5 days per week? No and Potentially 6. The potential for patient to make measurable gains while on inpatient rehab is fair 7. Anticipated functional outcomes upon discharge from inpatient rehab are mod assist  with PT, mod assist with OT, mod assist and max assist with SLP. 8. Estimated rehab length of stay to reach the above functional goals is: ?3-4 weeks???? 9. Anticipated D/C setting: Home 10. Anticipated post D/C treatments: HH therapy 11. Overall Rehab/Functional Prognosis: fair  RECOMMENDATIONS: This patient's condition is appropriate for continued rehabilitative care in the following setting: SNF Patient has agreed to participate in recommended program. N/A Note that insurance prior authorization may be required for reimbursement for recommended care.  Comment: While this patient could benefit from the inpatient rehab environment, I see activity tolerance and carryover as obstacles. Mother was at bedside and is primary caregiver---she uses a walker for balance.   Rehab Admissions Coordinator to follow up.  Thanks,  Ranelle Oyster, MD, Earlie Counts, PA-C 04/16/2017

## 2017-04-16 NOTE — Procedures (Signed)
I was present at this session.  I have reviewed the session itself and made appropriate changes.  HD via UA AVF.  bp in 130-150s.  tol well.   Access prss ok.   Tilmon Wisehart L 7/19/20183:14 PM

## 2017-04-16 NOTE — Progress Notes (Signed)
  Speech Language Pathology Treatment: Cognitive-Linquistic;Dysphagia  Patient Details Name: [name] MRN: [id number] DOB: Apr 06, 1981 Today's Date: 04/16/2017 Time: 2595-6387 SLP Time Calculation (min) (ACUTE ONLY): 20 min  Assessment / Plan / Recommendation Clinical Impression  Pt continues to demontrate improving cognition, particularly over the past two weeks. SLP offered min to moderate verbal/question cues during basic task with money. Pt able to name and identify all bills and coins and also complete basic addition tasks with 2 quantities. Increasing the complexity of the task by asking pt to add 3, 4 or 5 small numbers resulted in failure, and increased cueing needed for attention and working memory. Pt also requires max verbal cues to recall recent events of the day and explain details of events. Pt able to state some basic biographical information but not orientation to date, time or repetitive weekly events. Completed the 3 oz water test to screen for ability to upgrade liquid textures. Pt demonstrated no signs of aspiration, will f/u with MBS tomorrow given suspected resolution of dysphagia.   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 ETT 5/19 and trach'd 5/31. Hospital course included cardiac arrest, transfer to ICU      SLP Plan  MBS       Recommendations  Diet recommendations: Dysphagia 2 (fine chop);Nectar-thick liquid Liquids provided via: Cup;Straw Medication Administration: Crushed with puree Supervision: Patient able to self feed;Full supervision/cueing for compensatory strategies Compensations: Small sips/bites;Slow rate;Minimize environmental distractions Postural Changes and/or  Swallow Maneuvers: Seated upright 90 degrees                General recommendations: Rehab consult Oral Care Recommendations: Oral care BID Follow up Recommendations: Inpatient Rehab Plan: MBS       GO                Avory Rahimi, [name] 04/16/2017, 1:25 PM

## 2017-04-16 NOTE — Progress Notes (Signed)
CSW assisting with discharge disposition:  CSW spoke with pt mom concerning lack of options for patient she either needs to go to rehab and have her SSI check taken for length of rehab or return home which seems like an unsafe DC plan.  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  CSW made letter explained that pt depends on check to pay for her home and that rehab stay is expected for 6 months or less- faxed this to DSS to see if patient can keep more than $30 of her check.  Pt and mom prefer [facility] at this time but mom feels strongly that she would like to tour with other daughter on Saturday when other daughter comes to town before allowing transfer- CSW has explained that pt will likely be stable before this time.    CSW spoke with dialysis and confirmed pt is with [facility]- Sherrie Mustache can transport to this dialysis center  Burna Sis, LCSW Clinical Social Worker [phone]

## 2017-04-16 NOTE — Progress Notes (Signed)
[facility] Rounding Note  Subjective:  No acute events.  Continues to improve.  Getting eval'd by CIR  Vital signs in last 24 hours: Vitals:   04/16/17 0332 04/16/17 0500 04/16/17 0746 04/16/17 0800  BP: (!) 157/111  (!) 151/94 131/88  Pulse: (!) 111  (!) 111 (!) 107  Resp: 19  19 14   Temp: 97.8 F (36.6 C)  97.8 F (36.6 C) (!) 97.4 F (36.3 C)  TempSrc: Oral  Oral Oral  SpO2: 96%  98% 98%  Weight:  81 kg (178 lb 9.2 oz)    Height:       Weight change: 5 kg (11 lb 0.4 oz)  Intake/Output Summary (Last 24 hours) at 04/16/17 1121 Last data filed at 04/16/17 0200  Gross per 24 hour  Intake             1020 ml  Output                1 ml  Net             1019 ml   Physical Exam: GEN Alert, NAD HEENT: prior trach site healing PULM: normal WOB, clear bilaterally CV tachycardic  ABD obese, soft and non tender EXT: Left AKA dressed, no edema, Right foot in protective boot Dialysis Access: Left AVF + bruit and thrill  Dialysis: TTS [facility] 4h   F200  114kg   2/2 bath  LUA AVF  Hep none - Iron Sucrose (Venofer) 50 mg IVP During Dialysis 1X Week  - Mircera 75 mcg IV q  2 weeks ( last on 02/05/17) - Vitamin D (Calcitriol) Oral 0.75 mcg  Po q hd   Summary: 36 year old female w/ ESRD d/t poorly controlled DM, HD bilateral TMA's, obesity, HTN, medical non-compliance, NICM. Admitted 5/19 w/left leg pain, + radiological evidence of osteomyelitis w/wound nonhealing, dehiscence, purulent drainage. Admitted for IV antibiotics, hemodialysis and possible surgery. Found unresponsive in her room that afternoon - cardiac arrest > rec'd  CPR, epi bicarb; intubated; cooled. Now with anoxic brain injury, trach and s/p L AKA this hosp. Course further complicated by recurrent aspiration PNA, dehiscence of AK wound requiring revision 7/6.   Assessment/ Plan: 1. Anoxic brain injury sp arrest - pt awake, speaking, eating now. Improved.  2. Trach - s/p decannulation 7/16. 3. Fever/ PNA /^WBC - completed abx  7/12.  Resolved. 4. ESRD -  TTS HD, started HD June '17. Continue normal schedule. 5. HTN - BP's up the last 48 hrs, on low dose Coreg. Have d/c'd midodrine 6. Anemia- on max aranesp- hgb stable in 8's.  Repeat iron panel reordered 7. MBD of CKD - phos in range, Ca++ creeping up, will dc CaCO3. PTH 88 here.   8. 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   9. DM2 - per primary 10. sHF - EF 25-30%; improving BP 11. Nutrition-  albumin 2.2.  Taking prostat. 12. Dispo-Since she's decannulated that makes it easier for dispo. ? CIR vs SNF     Bufford Buttner MD [facility] pgr [phone]    Recent Labs Lab 04/11/17 0341 04/13/17 1426 04/14/17 0730  NA 135 139 141  K 3.9 4.5 4.4  CL 94* 99* 100*  CO2 28 27 29   GLUCOSE 104* 110* 116*  BUN 33* 36* 47*  CREATININE 4.12* 4.27* 5.00*  CALCIUM 9.2 9.4 9.5  PHOS  --  4.6 4.8*    Recent Labs Lab 04/13/17 1426 04/14/17 0730  ALBUMIN 2.5* 2.4*    Recent Labs Lab 04/11/17 0341 04/13/17 1426 04/14/17 0730  WBC 10.1 9.7 9.2  HGB 8.3* 9.1* 8.9*  HCT 29.0* 32.5* 31.0*  MCV 98.0 102.2* 100.0  PLT 397 434* 483*     Recent Labs Lab 04/14/17 2316 04/15/17 0737 04/15/17 1137 04/15/17 1638 04/16/17 0001  GLUCAP 154* 130* 193* 100* 137*    Medications: Infusions: . sodium chloride    . sodium chloride      Scheduled Medications: . carvedilol  6.25 mg Oral BID WC  . darbepoetin (ARANESP) injection - DIALYSIS  200 mcg Intravenous Q Thu-HD  . dextrose  1 ampule Intravenous Once  . docusate sodium  100 mg Oral BID  . DULoxetine  30 mg Oral Daily  . feeding supplement (PRO-STAT SUGAR FREE 64)  30 mL Oral TID  . [name]'s butt cream   Topical QID  . heparin  5,000 Units Subcutaneous Q8H  . insulin aspart  0-15 Units Subcutaneous TID WC  . insulin glargine  8 Units Subcutaneous Daily  . mouth rinse  15 mL Mouth Rinse BID  . multivitamin  1 tablet Oral QHS

## 2017-04-16 NOTE — Progress Notes (Signed)
PROGRESS NOTE    [name]  WUJ:[id number] DOB: 19-Jan-1981 DOA: 02/14/2017 PCP: [name], FNP    Brief Narrative: Patient is a 36 y.o.femalewith history of medical noncompliance, ESRD on HD, poorly controlled diabetes, PVD, bilateral transmetatarsal amputation with subsequent left foot wound dehiscence (refused BKA), chronic systolic heart failure due to nonischemic cardiomyopathy.   Admitted on 02/14/17 to [facility] with left foot/leg pain, wound dehiscence and purulent drainage from the left trans-metatarsal amputation site.   She was subsequently found to be unresponsive on 5/19. Noted to bein asystole and CPR started with ROSC in approximately 9 minutes. She was intubated by anaesthesia, transferred to the intensive care unit and subsequently underwent cooling protocol. She was unable to be liberated off the ventilator and as a result underwent tracheostomy on 5/31. She underwent decannulation on 7/16.  Neurological/ Cognitive status has been poor. She has been evaluated by neurology on 5/29 and, per neurology, chances of meaningful neurological recovery appeared to be dismal.  Currently she is able to follow simple commands and is alert and is eating.  Hospital course complicated by persistent leukocytosis, respiratory failure and left transmetatarsal wound dehiscence with gangrene. 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  Over the course of her hospitalizations, she has been treated for multiple episodes of recurrent aspiration pneumonias.  Currently off antibiotics and awaiting placement.   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)   Diabetic osteomyelitis (HCC)   Dehiscence of amputation stump (HCC)   Osteomyelitis (HCC)   Chronic pain syndrome   Cardiac arrest, cause unspecified (HCC)   Pressure injury of skin   Wound dehiscence   Coma (HCC)   Elevated liver enzymes   Jaundice   Encounter for PEG (percutaneous endoscopic gastrostomy) (HCC)   Protein calorie malnutrition (HCC)   DNR (do not resuscitate) discussion   Palliative care by specialist   Tachycardia   Goals of care, counseling/discussion   Anoxic brain injury [facility])   Palliative care encounter   Tracheostomy status (HCC)   Abscess of left leg   Hypoxemia   Fever   Aspiration into airway   Acute on chronic respiratory failure, in the setting of cardiac arrest with superimposed recurrent aspiration pneumonias during her hospitalization.  S/p intubation on 5/19 and extubated 5/31, tracheostomy, and de cannulated on 7/16.  - currently on RA, and off antibiotics.  - PEG on hold and on dysphagia diet and mom at bedside, reports good appetite.  - no respiratory issues so far.    ESRD on HD  TTS per nephrology.  Nephrology on board and recommendations given.  Plan for HD in chair today.  Midodrine discontinued as her BP improved.    Left trans metatarsal stump wound dehiscence with infection and gangrene: - underwent AKA, because of wound dehiscence and osteomyelitis. She underwent revision of the AKA on 7/6.  - dry dressings as needed.  - off the wound vac.    Right heel eschar/ pressure ulcers on the sacral area:  Wound care consulted and recommendations given.   Type 2 DM:  CBG (  last 3)   Recent Labs  04/15/17 1137 04/15/17 1638 04/16/17 0001  GLUCAP 193* 100* 137*    Resume SSI and lantus of 8 units.  No change in insulin regimen.    Anoxic brain injury:  Slow cognitive improvement  Following some commands.    Normocytic anemia:  Anemia of chronic  disease worsened by acute illness .  Hemoglobin stable around 8.    NICM, WITH chronic systolic heart failure:  She appears to be compensated.  Fluid management with HD.  Resume coreg. Hold for SBP<90MMHG.    Pericardial effusion without evidence of tamponade physiology.   Moderate protein calorie malnutrition:  Dietary consulted and recommendations given.   Depression;  Resumed home cymbalta.    Dysphagia:  On dys 2 diet with nectar thick liquids.       DVT prophylaxis:heparin sq Code Status: full code.  Family Communication: mom at bedside.  Disposition Plan: pending CIR consult vs SNF placement, in 1 to 2 days.    Consultants:   PCCM as needed  Nephrology  Palliative care consult   Orthopedics,    Procedures: STUDIES:  Lower extremity ultrasound 5/18 >> no evidence of DVT CT head 5/19 >> normal exam Echo 5/20 >> EF 30-35%, increased LVF. Diffuse hypokinesis, akinesis of basilar mid-inferior myocardium, grade 2 diastolic dysfxn  EEG 5/20 >> finding c/w mod to severe global cerebral dysfxn. C/w anoxic injury given clinical course  LE [place] 5/21 >> negative MRI brain 5/24 >> ?thrombosed cortical vein, otherwise normal TEE 6/4 >> mild MR, normal AV, mild TR, mild PR, EF 30-35%, diffuse hypokinesis, no thrombus, no PFO, normal RV, moderate pericardial effusion with synechia suggesting some chronicity, no vegetations   Antimicrobials: Clinidamycin 5/19 >> 5/21 Vancomycin 5/19 >> 5/25 >> restart 5/30 Zosyn 5/19 >> 5/22 Meropenem 5/22 >> 5/30 Flagyl 5/30 >> off Ceftriaxone 5/30 >> 6/4  SIGNIFICANT EVENTS: 5/19  Admit, cardiac arrest, transfer to ICU; hypothermia protocol started 5/24  Off pressors.  MRI w/out evidence of ischemia  6/03  Tolerated ATC couple hours, then back to vent, HD  7/11  Trach capped  7/16  On RA.  Trach dislodged over the weekend, replaced without difficulty. Decannulated late PM  LINES/TUBES: ETT 5/19 >> 5/31 Lt IJ CVL 5/19 >>  out Trach 5/31 >> 7/16  Subjective: No new complaints.  No chest pain or sob.  No nausea, vomiting.   Objective: Vitals:   04/16/17 0332 04/16/17 0500 04/16/17 0746 04/16/17 0800  BP: (!) 157/111  (!) 151/94 131/88  Pulse: (!) 111  (!) 111 (!) 107  Resp: 19  19 14   Temp: 97.8 F (36.6 C)  97.8 F (36.6 C) (!) 97.4 F (36.3 C)  TempSrc: Oral  Oral Oral  SpO2: 96%  98% 98%  Weight:  81 kg (178 lb 9.2 oz)    Height:        Intake/Output Summary (Last 24 hours) at 04/16/17 0938 Last data filed at 04/16/17 0200  Gross per 24 hour  Intake             1320 ml  Output                1 ml  Net             1319 ml   Filed Weights   04/14/17 0500 04/15/17 0400 04/16/17 0500  Weight: 80 kg (176 lb 5.9 oz) 76 kg (167 lb 8.8 oz) 81 kg (178 lb 9.2 oz)  Examination:  General exam: Appears calm and comfortable  Respiratory system: Clear to auscultation. Respiratory effort normal. Cardiovascular system: S1 & S2 heard, no murmer, tachycardic.   Gastrointestinal system: Abdomen is nondistended, soft and nontender. No organomegaly or masses felt. Normal bowel sounds heard. Central nervous system:alert but not oriented. Able to move all extremities.  Extremities: left Aka. Normal tone.  Skin: sacral area ulcer, right heel ischar.  Psychiatry: no agitation, calm.     Data Reviewed: I have personally reviewed following labs and imaging studies  CBC:  Recent Labs Lab 04/11/17 0341 04/13/17 1426 04/14/17 0730  WBC 10.1 9.7 9.2  HGB 8.3* 9.1* 8.9*  HCT 29.0* 32.5* 31.0*  MCV 98.0 102.2* 100.0  PLT 397 434* 483*   Basic Metabolic Panel:  Recent Labs Lab 04/11/17 0341 04/13/17 1426 04/14/17 0730  NA 135 139 141  K 3.9 4.5 4.4  CL 94* 99* 100*  CO2 28 27 29   GLUCOSE 104* 110* 116*  BUN 33* 36* 47*  CREATININE 4.12* 4.27* 5.00*  CALCIUM 9.2 9.4 9.5  PHOS  --  4.6 4.8*   GFR: Estimated Creatinine Clearance: 17.4 mL/min (A) (by C-G formula based on SCr of 5 mg/dL  (H)). Liver Function Tests:  Recent Labs Lab 04/13/17 1426 04/14/17 0730  ALBUMIN 2.5* 2.4*   No results for input(s): LIPASE, AMYLASE in the last 168 hours. No results for input(s): AMMONIA in the last 168 hours. Coagulation Profile: No results for input(s): INR, PROTIME in the last 168 hours. Cardiac Enzymes: No results for input(s): CKTOTAL, CKMB, CKMBINDEX, TROPONINI in the last 168 hours. BNP (last 3 results) No results for input(s): PROBNP in the last 8760 hours. HbA1C: No results for input(s): HGBA1C in the last 72 hours. CBG:  Recent Labs Lab 04/14/17 2316 04/15/17 0737 04/15/17 1137 04/15/17 1638 04/16/17 0001  GLUCAP 154* 130* 193* 100* 137*   Lipid Profile: No results for input(s): CHOL, HDL, LDLCALC, TRIG, CHOLHDL, LDLDIRECT in the last 72 hours. Thyroid Function Tests: No results for input(s): TSH, T4TOTAL, FREET4, T3FREE, THYROIDAB in the last 72 hours. Anemia Panel: No results for input(s): VITAMINB12, FOLATE, FERRITIN, TIBC, IRON, RETICCTPCT in the last 72 hours. Sepsis Labs: No results for input(s): PROCALCITON, LATICACIDVEN in the last 168 hours.  No results found for this or any previous visit (from the past 240 hour(s)).       Radiology Studies: No results found.      Scheduled Meds: . carvedilol  6.25 mg Oral BID WC  . darbepoetin (ARANESP) injection - DIALYSIS  200 mcg Intravenous Q Thu-HD  . dextrose  1 ampule Intravenous Once  . docusate sodium  100 mg Oral BID  . DULoxetine  30 mg Oral Daily  . feeding supplement (PRO-STAT SUGAR FREE 64)  30 mL Oral TID  . [name]'s butt cream   Topical QID  . heparin  5,000 Units Subcutaneous Q8H  . insulin aspart  0-15 Units Subcutaneous TID WC  . insulin glargine  8 Units Subcutaneous Daily  . mouth rinse  15 mL Mouth Rinse BID  . multivitamin  1 tablet Oral QHS   Continuous Infusions: . sodium chloride    . sodium chloride       LOS: 61 days    Time spent:35 minutes.      Kathlen Mody, MD [facility] Pager [phone]  If 7PM-7AM, please contact night-coverage [email or web address] Password [facility] 04/16/2017, 9:38 AM

## 2017-04-16 NOTE — Progress Notes (Signed)
Inpatient Rehabilitation  Please see IP Rehab MD consult by Dr. Riley Kill for full details, recommending SNF for post acute therapies at this time.  Attempted to meet with patient to discuss; however, patient is in dialysis at this time.  Plan to follow up tomorrow.  Please call with questions.   Charlane Ferretti., CCC/SLP Admission Coordinator  [facility] Inpatient Rehabilitation  Cell [phone]

## 2017-04-16 NOTE — Progress Notes (Signed)
Physical Therapy Treatment Patient Details Name: [name] MRN: [id number] DOB: [birth year] Today's Date: 04/16/2017    History of Present Illness [name] is a 36 y.o. 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 ETT 5/19 and trach'd 5/31. Trach removed 7/16.    PT Comments    Pt with improved cognition and mobility. Pt able to answer questions (not always accurately) with slight delay. Following simple commands consistently with delay and able to participate in treatment session. Recommend CIR to assist with providing more intensive therapy to allow pt quicker progression resulting in decr burden of care.   Follow Up Recommendations  Supervision/Assistance - 24 hour;CIR     Equipment Recommendations  Other (comment) (defer to next venue)    Recommendations for Other Services       Precautions / Restrictions Precautions Precautions: Fall    Mobility  Bed Mobility Overal bed mobility: Needs Assistance Bed Mobility: Supine to Sit;Sit to Sidelying;Rolling Rolling: Min assist;Min guard (Min guard assist to roll left and min assist to roll rt) Sidelying to sit: Mod assist;HOB elevated     Sit to sidelying: Mod assist General bed mobility comments: Assist to bring RLE off bed and to push trunk up into sitting. Assist to lower trunk and bring RLE up into bed  Transfers                    Ambulation/Gait                 Stairs            Wheelchair Mobility    Modified Rankin (Stroke Patients Only)       Balance Overall balance assessment: Needs assistance Sitting-balance support: Bilateral upper extremity supported;Feet supported Sitting  balance-Leahy Scale: Poor Sitting balance - Comments: Pt sat EOB x 15 minutes with min guard to mod assist. Pt able to assist to correct balance with verbal/tactile cues. Worked on scooting hips toward EOB. Pt able to engage in conversation while on EOB. Postural control: Posterior lean;Other (comment) (anterior lean)                                  Cognition Arousal/Alertness: Awake/alert Behavior During Therapy: Flat affect Overall Cognitive Status: Impaired/Different from baseline Area of Impairment: Memory;Following commands;Safety/judgement;Problem solving;Attention;Orientation                 Orientation Level: Disoriented to;Time;Situation Current Attention Level: Sustained Memory: Decreased recall of precautions;Decreased short-term memory Following Commands: Follows one step commands with increased time Safety/Judgement: Decreased awareness of deficits;Decreased awareness of safety   Problem Solving: Slow processing;Decreased initiation;Difficulty sequencing;Requires tactile cues;Requires verbal cues General Comments: Pt would arouse for periods with stimuli but falling asleep frequently.      Exercises      General Comments        Pertinent Vitals/Pain Pain Assessment: Faces Faces Pain Scale: Hurts little more Pain Location: Lt residual limb Pain Descriptors / Indicators: Grimacing Pain Intervention(s): Limited activity within patient's tolerance;Monitored during session;Repositioned    Home Living  Prior Function            PT Goals (current goals can now be found in the care plan section) Progress towards PT goals: Progressing toward goals    Frequency    Min 2X/week      PT Plan Discharge plan needs to be updated    Co-evaluation              AM-PAC PT "6 Clicks" Daily Activity  Outcome Measure  Difficulty turning over in bed (including adjusting bedclothes, sheets and blankets)?:  Total Difficulty moving from lying on back to sitting on the side of the bed? : Total Difficulty sitting down on and standing up from a chair with arms (e.g., wheelchair, bedside commode, etc,.)?: Total Help needed moving to and from a bed to chair (including a wheelchair)?: Total Help needed walking in hospital room?: Total Help needed climbing 3-5 steps with a railing? : Total 6 Click Score: 6    End of Session   Activity Tolerance: Patient tolerated treatment well Patient left: in bed;with call bell/phone within reach;with family/visitor present Nurse Communication: Mobility status;Need for lift equipment PT Visit Diagnosis: Muscle weakness (generalized) (M62.81);Other abnormalities of gait and mobility (R26.89)     Time: 9629-5284 PT Time Calculation (min) (ACUTE ONLY): 29 min  Charges:  $Therapeutic Activity: 23-37 mins                    G Codes:       [facility] PT [phone]    Angelina Ok [facility] 04/16/2017, 3:23 PM

## 2017-04-17 ENCOUNTER — Inpatient Hospital Stay (HOSPITAL_COMMUNITY): Payer: Medicaid Other

## 2017-04-17 LAB — GLUCOSE, CAPILLARY
GLUCOSE-CAPILLARY: 126 mg/dL — AB (ref 65–99)
Glucose-Capillary: 93 mg/dL (ref 65–99)
Glucose-Capillary: 225 mg/dL — ABNORMAL HIGH (ref 65–99)
Glucose-Capillary: 155 mg/dL — ABNORMAL HIGH (ref 65–99)

## 2017-04-17 NOTE — Progress Notes (Signed)
Inpatient Rehabilitation  Met with patient to discuss recommendations for SNF level post acute rehab.  Patient nodded but not sure of her understanding, no other family at bedside.  Note that CSW has been working with patient and her mother.  Will sign off at this time.  Call if there are questions.   Carmelia Roller., CCC/SLP Admission Coordinator  [facility]  Cell [phone]

## 2017-04-17 NOTE — Progress Notes (Signed)
[facility] Rounding Note  Subjective:  No acute events. Tolerated HD well yesterday.  Vital signs in last 24 hours: Vitals:   04/17/17 0500 04/17/17 0630 04/17/17 0755 04/17/17 0800  BP:  (!) 152/97 (!) 130/91 (!) 143/91  Pulse:  (!) 113  (!) 111  Resp:  18 (!) 30 18  Temp:   98.1 F (36.7 C)   TempSrc:   Oral   SpO2:  96%  97%  Weight: 82 kg (180 lb 12.4 oz)     Height:       Weight change: 1 kg (2 lb 3.3 oz)  Intake/Output Summary (Last 24 hours) at 04/17/17 1123 Last data filed at 04/17/17 1047  Gross per 24 hour  Intake              443 ml  Output             1002 ml  Net             -559 ml   Physical Exam: GEN Alert, NAD HEENT: prior trach site healing PULM: normal WOB, clear bilaterally CV tachycardic  ABD obese, soft and non tender EXT: Left AKA dressed, no edema, Right foot in protective boot Dialysis Access: Left AVF + bruit and thrill  Dialysis: TTS [facility] 4h   F200  114kg   2/2 bath  LUA AVF  Hep none - Iron Sucrose (Venofer) 50 mg IVP During Dialysis 1X Week  - Mircera 75 mcg IV q  2 weeks ( last on 02/05/17) - Vitamin D (Calcitriol) Oral 0.75 mcg  Po q hd   Summary: 36 year old female w/ ESRD d/t poorly controlled DM, HD bilateral TMA's, obesity, HTN, medical non-compliance, NICM. Admitted 5/19 w/left leg pain, + radiological evidence of osteomyelitis w/wound nonhealing, dehiscence, purulent drainage. Admitted for IV antibiotics, hemodialysis and possible surgery. Found unresponsive in her room that afternoon - cardiac arrest > rec'd  CPR, epi bicarb; intubated; cooled. Now with anoxic brain injury, trach and s/p L AKA this hosp. Course further complicated by recurrent aspiration PNA, dehiscence of AK wound requiring revision 7/6.   Assessment/ Plan: 1. Anoxic brain injury sp arrest - pt awake, speaking, eating now. Improved.  2. Trach - s/p decannulation 7/16. 3. Fever/ PNA /^WBC - completed abx 7/12.  Resolved. 4. ESRD -  TTS HD, started HD June '17.  Continue normal schedule. 5. HTN - BP's up the last 48 hrs, on low dose Coreg. Have d/c'd midodrine.  Has had only bed weights--> will ask to get a hoyer lift weight to adequately prescribe UF 6. Anemia- on max aranesp- hgb stable in 8's.  Repeat iron panel reordered 7. MBD of CKD - phos in range, Ca++ creeping up, will dc CaCO3. PTH 88 here.   8. 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   9. DM2 - per primary 10. sHF - EF 25-30%; improving BP 11. Nutrition-  albumin 2.2.  Taking prostat. 12. Dispo-Since she's decannulated that makes it easier for dispo. Likely SNF for her.     Bufford Buttner MD [facility] pgr [phone]    Recent Labs Lab 04/13/17 1426 04/14/17 0730 04/16/17 1408  NA 139 141 133*  K 4.5 4.4 4.8  CL 99* 100* 94*  CO2 27 29 24   GLUCOSE 110* 116* 156*  BUN 36* 47* 51*  CREATININE 4.27* 5.00* 5.00*  CALCIUM 9.4 9.5 9.1  PHOS 4.6 4.8* 4.8*    Recent Labs Lab 04/13/17  1426 04/14/17 0730 04/16/17 1408  ALBUMIN 2.5* 2.4* 2.3*    Recent Labs Lab 04/11/17 0341 04/13/17 1426 04/14/17 0730 04/16/17 1407  WBC 10.1 9.7 9.2 9.2  HGB 8.3* 9.1* 8.9* 8.6*  HCT 29.0* 32.5* 31.0* 29.6*  MCV 98.0 102.2* 100.0 98.0  PLT 397 434* 483* 399     Recent Labs Lab 04/16/17 0001 04/16/17 0750 04/16/17 1210 04/16/17 2125 04/17/17 0758  GLUCAP 137* 113* 168* 172* 93    Medications: Infusions: . sodium chloride    . sodium chloride      Scheduled Medications: . carvedilol  6.25 mg Oral BID WC  . darbepoetin (ARANESP) injection - DIALYSIS  200 mcg Intravenous Q Thu-HD  . dextrose  1 ampule Intravenous Once  . docusate sodium  100 mg Oral BID  . DULoxetine  30 mg Oral Daily  . feeding supplement (PRO-STAT SUGAR FREE 64)  30 mL Oral TID  . [name]'s butt cream   Topical QID  . heparin  5,000 Units Subcutaneous Q8H  . insulin aspart  0-15 Units Subcutaneous TID WC  . insulin glargine  8 Units Subcutaneous Daily  . mouth rinse   15 mL Mouth Rinse BID  . multivitamin  1 tablet Oral QHS  . sodium chloride flush  3 mL Intravenous Q12H

## 2017-04-17 NOTE — Progress Notes (Signed)
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   Admitted on 02/14/17 to [facility] with left foot/leg pain, wound dehiscence and purulent drainage from the left trans-metatarsal amputation site.   She was subsequently found to be unresponsive on 5/19. Noted to bein asystole and CPR started with ROSC in approximately 9 minutes. She was intubated by anaesthesia, transferred to the intensive care unit and subsequently underwent cooling protocol. She was unable to be liberated off the ventilator and as a result underwent tracheostomy on 5/31. She underwent decannulation on 7/16.  Neurological/ Cognitive status has been poor. She has been evaluated by neurology on 5/29 and, per neurology, chances of meaningful neurological recovery appeared to be dismal.  Currently she is able to follow simple commands and is alert and is eating.  Hospital course complicated by persistent leukocytosis, respiratory failure and left transmetatarsal wound dehiscence with gangrene. 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  Over the course of her hospitalizations, she has been treated for multiple episodes of recurrent aspiration pneumonias.  Currently off antibiotics and awaiting placement.   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)   Diabetic osteomyelitis (HCC)   Dehiscence of amputation stump (HCC)   Osteomyelitis (HCC)   Chronic pain syndrome   Cardiac arrest, cause unspecified (HCC)   Pressure injury of skin   Wound dehiscence   Coma (HCC)   Elevated liver enzymes   Jaundice   Encounter for PEG (percutaneous endoscopic gastrostomy) (HCC)   Protein calorie malnutrition (HCC)   DNR (do not resuscitate) discussion   Palliative care by specialist   Tachycardia   Goals of care, counseling/discussion   Anoxic brain injury [facility])   Palliative care encounter   Tracheostomy status (HCC)   Abscess of left leg   Hypoxemia   Fever   Aspiration into airway   Acute on chronic respiratory failure, in the setting of cardiac arrest with superimposed recurrent aspiration pneumonias during her hospitalization.  S/p intubation on 5/19 and extubated 5/31, tracheostomy, and de cannulated on 7/16.  - currently on RA, and off antibiotics.  - PEG on hold and on dysphagia diet and mom at bedside, reports good appetite.  - no respiratory issues so far.  - no new medications.    ESRD on HD  TTS per nephrology.  Nephrology on board and recommendations given.  Tolerated HD in chair well.  Midodrine discontinued as her BP improved.     Left trans metatarsal stump wound dehiscence with infection and gangrene: - underwent AKA, because of wound dehiscence and osteomyelitis. She underwent revision of the AKA on 7/6.  - dry dressings as needed.  - off the wound vac.  - no changes.    Right heel eschar/ pressure ulcers on the sacral area:  Wound care consulted and  recommendations given.   Type 2 DM:  CBG (last 3)   Recent Labs  04/16/17 2125 04/17/17 0758 04/17/17 1147  GLUCAP 172* 93 225*    Resume SSI and lantus of 8 units.  No change in insulin regimen.    Anoxic brain injury:  Slow cognitive improvement  Following some commands.  No  agitation.    Normocytic anemia:  Anemia of chronic disease worsened by acute illness .  Hemoglobin stable around 8.    NICM, WITH chronic systolic heart failure:  She appears to be compensated.  Fluid management with HD.  Resume coreg. Hold for SBP<90MMHG.    Pericardial effusion without evidence of tamponade physiology.   Moderate protein calorie malnutrition:  Dietary consulted and recommendations given.   Depression;  Resumed home cymbalta.    Dysphagia:  On dys 2 diet with nectar thick liquids.    CIR consutled to see if she is a candidate for acute rehab. But deferred to SNF. Pt's mom will see facilities tomorrow and plan for discharge when bed available.    DVT prophylaxis:heparin sq Code Status: full code.  Family Communication: mom at bedside.  Disposition Plan:SNF placement when bed available. Pt stable for discharge.   Consultants:   PCCM as needed  Nephrology  Palliative care consult   Orthopedics,    Procedures: STUDIES:  Lower extremity ultrasound 5/18 >> no evidence of DVT CT head 5/19 >> normal exam Echo 5/20 >> EF 30-35%, increased LVF. Diffuse hypokinesis, akinesis of basilar mid-inferior myocardium, grade 2 diastolic dysfxn  EEG 5/20 >> finding c/w mod to severe global cerebral dysfxn. C/w anoxic injury given clinical course  LE [place] 5/21 >> negative MRI brain 5/24 >> ?thrombosed cortical vein, otherwise normal TEE 6/4 >> mild MR, normal AV, mild TR, mild PR, EF 30-35%, diffuse hypokinesis, no thrombus, no PFO, normal RV, moderate pericardial effusion with synechia suggesting some chronicity, no vegetations   Antimicrobials: Clinidamycin 5/19 >> 5/21 Vancomycin 5/19 >> 5/25 >> restart 5/30 Zosyn 5/19 >> 5/22 Meropenem 5/22 >> 5/30 Flagyl 5/30 >> off Ceftriaxone 5/30 >> 6/4  SIGNIFICANT EVENTS: 5/19  Admit, cardiac arrest, transfer to ICU; hypothermia protocol started 5/24  Off pressors.  MRI w/out evidence of ischemia  6/03  Tolerated  ATC couple hours, then back to vent, HD  7/11  Trach capped  7/16  On RA.  Trach dislodged over the weekend, replaced without difficulty. Decannulated late PM  LINES/TUBES: ETT 5/19 >> 5/31 Lt IJ CVL 5/19 >> out Trach 5/31 >> 7/16  Subjective: No new complaints.  No chest pain or sob.  No nausea, vomiting.    Objective: Vitals:   04/17/17 1200 04/17/17 1300 04/17/17 1400 04/17/17 1500  BP: 131/89 (!) 152/92 111/72 (!) 142/107  Pulse: (!) 104 (!) 25 62 (!) 105  Resp: (!) 23 (!) 21 (!) 22 (!) 25  Temp:    98 F (36.7 C)  TempSrc:    Oral  SpO2: 97% 97% 98% 99%  Weight:      Height:        Intake/Output Summary (Last 24 hours) at 04/17/17 1553 Last data filed at 04/17/17 1047  Gross per 24 hour  Intake              443 ml  Output             1002 ml  Net             -559 ml   [employer]  04/15/17 0400 04/16/17 0500 04/17/17 0500  Weight: 76 kg (167 lb 8.8 oz) 81 kg (178 lb 9.2 oz) 82 kg (180 lb 12.4 oz)    Examination: stable.  General exam: Appears calm and comfortable  Respiratory system: Clear to auscultation. Respiratory effort normal. Cardiovascular system: S1 & S2 heard, no murmer, tachycardic.   Gastrointestinal system: Abdomen is nondistended, soft and nontender. No organomegaly or masses felt. Normal bowel sounds heard. Central nervous system:alert but not oriented. Able to move all extremities.  Extremities: left Aka. Normal tone.  Skin: sacral area ulcer, right heel ischar.  Psychiatry: no agitation, calm.     Data Reviewed: I have personally reviewed following labs and imaging studies  CBC:  Recent Labs Lab 04/11/17 0341 04/13/17 1426 04/14/17 0730 04/16/17 1407  WBC 10.1 9.7 9.2 9.2  HGB 8.3* 9.1* 8.9* 8.6*  HCT 29.0* 32.5* 31.0* 29.6*  MCV 98.0 102.2* 100.0 98.0  PLT 397 434* 483* 399   Basic Metabolic Panel:  Recent Labs Lab 04/11/17 0341 04/13/17 1426 04/14/17 0730 04/16/17 1408  NA 135 139 141 133*  K 3.9 4.5 4.4 4.8    CL 94* 99* 100* 94*  CO2 28 27 29 24   GLUCOSE 104* 110* 116* 156*  BUN 33* 36* 47* 51*  CREATININE 4.12* 4.27* 5.00* 5.00*  CALCIUM 9.2 9.4 9.5 9.1  PHOS  --  4.6 4.8* 4.8*   GFR: Estimated Creatinine Clearance: 17.5 mL/min (A) (by C-G formula based on SCr of 5 mg/dL (H)). Liver Function Tests:  Recent Labs Lab 04/13/17 1426 04/14/17 0730 04/16/17 1408  ALBUMIN 2.5* 2.4* 2.3*   No results for input(s): LIPASE, AMYLASE in the last 168 hours. No results for input(s): AMMONIA in the last 168 hours. Coagulation Profile: No results for input(s): INR, PROTIME in the last 168 hours. Cardiac Enzymes: No results for input(s): CKTOTAL, CKMB, CKMBINDEX, TROPONINI in the last 168 hours. BNP (last 3 results) No results for input(s): PROBNP in the last 8760 hours. HbA1C: No results for input(s): HGBA1C in the last 72 hours. CBG:  Recent Labs Lab 04/16/17 0750 04/16/17 1210 04/16/17 2125 04/17/17 0758 04/17/17 1147  GLUCAP 113* 168* 172* 93 225*   Lipid Profile: No results for input(s): CHOL, HDL, LDLCALC, TRIG, CHOLHDL, LDLDIRECT in the last 72 hours. Thyroid Function Tests: No results for input(s): TSH, T4TOTAL, FREET4, T3FREE, THYROIDAB in the last 72 hours. Anemia Panel:  Recent Labs  04/16/17 1410  FERRITIN 1,338*  TIBC 157*  IRON 56   Sepsis Labs: No results for input(s): PROCALCITON, LATICACIDVEN in the last 168 hours.  No results found for this or any previous visit (from the past 240 hour(s)).       Radiology Studies: Dg Swallowing Func-speech Pathology  Result Date: 04/17/2017 Objective Swallowing Evaluation: Type of Study: MBS-Modified Barium Swallow Study Patient Details Name: [name] MRN: [id number] Date of Birth: [birth year] Today's Date: 04/17/2017 Time: SLP Start Time (ACUTE ONLY): 1100-SLP Stop Time (ACUTE ONLY): 1130 SLP Time Calculation (min) (ACUTE ONLY): 30 min Past Medical History: Past Medical History: Diagnosis Date . Anemia  . Arthritis    "left hand" (09/15/2013) . Asthma  . CHF (congestive heart failure) (HCC)  . Chronic bronchitis (HCC)   "just about q yr" (09/15/2013) . Chronic kidney disease   "low kidney function" (09/15/2013) ,  T/Th/Sa . COPD (chronic obstructive pulmonary disease) (HCC)  . Coronary artery disease  . Hyperlipidemia  . Hypertension  . Migraine   "get them alot" (09/15/2013) . Myocardial infarction (  HCC) 04/2015  NSTEMI . Normal coronary arteries   by cardiac catheterization 09/20/13 . Peripheral vascular disease (HCC)  . Pneumonia   "couple times; have it now" (09/15/2013) . PONV (postoperative nausea and vomiting)  . Restless legs  . Shortness of breath   "just recently; related to the pneumonia" (09/15/2013) . Type 1 diabetes mellitus (HCC)   type 2 Past Surgical History: Past Surgical History: Procedure Laterality Date . AMPUTATION Right 06/25/2016  Procedure: Amputation Right Great Toe at the Metatarsophalangeal Joint;  Surgeon: Nadara Mustard, MD;  Location[facility] OR;  Service: Orthopedics;  Laterality: Right; . AMPUTATION Bilateral 10/08/2016  Procedure: Bilateral Transmetatarsal Amputation;  Surgeon: Nadara Mustard, MD;  Location: [facility] OR;  Service: Orthopedics;  Laterality: Bilateral; . AMPUTATION Left 03/13/2017  Procedure: AMPUTATION ABOVE KNEE;  Surgeon: Nadara Mustard, MD;  Location: [facility] OR;  Service: Orthopedics;  Laterality: Left; . APPLICATION OF WOUND VAC Left 04/03/2017  Procedure: Application of Wound Vacuum;  Surgeon: Nadara Mustard, MD;  Location: [facility] OR;  Service: Orthopedics;  Laterality: Left; . AV FISTULA PLACEMENT Left 03/27/2015  Procedure: CREATION RADIAL CEPHALIC ARTERIOVENOUS FISTULA;  Surgeon: Chuck Hint, MD;  Location: [facility] OR;  Service: Vascular;  Laterality: Left; . AV FISTULA PLACEMENT Left 11/23/2015  Procedure:  LEFT ARM BASILIC VEIN TRANSPOSITION  ;  Surgeon: Chuck Hint, MD;  Location: [facility] OR;  Service: Vascular;  Laterality: Left; . CESAREAN SECTION  1999; 2006 . CORONARY ANGIOGRAM   09/20/2013  Procedure: CORONARY ANGIOGRAM;  Surgeon: Runell Gess, MD;  Location: [facility] CATH LAB;  Service: Cardiovascular;; . FINGER SURGERY Left 1985  3rd and 4th digits reconstructed after cut off" (09/15/2013) . IR FLUORO GUIDE CV LINE RIGHT  03/11/2017 . IR US GUIDE VASC ACCESS RIGHT  03/11/2017 . PERIPHERAL VASCULAR CATHETERIZATION N/A 09/05/2016  Procedure: Abdominal Aortogram;  Surgeon: Sherren Kerns, MD;  Location: [facility] INVASIVE CV LAB;  Service: Cardiovascular;  Laterality: N/A; . PERIPHERAL VASCULAR CATHETERIZATION Bilateral 09/05/2016  Procedure: Lower Extremity Angiography;  Surgeon: Sherren Kerns, MD;  Location: [facility] INVASIVE CV LAB;  Service: Cardiovascular;  Laterality: Bilateral; . PERIPHERAL VASCULAR CATHETERIZATION Right 09/05/2016  Procedure: Peripheral Vascular Balloon Angioplasty;  Surgeon: Sherren Kerns, MD;  Location: [facility] INVASIVE CV LAB;  Service: Cardiovascular;  Laterality: Right;  peroneal and AT . SHOULDER ARTHROSCOPY WITH BICEPSTENOTOMY Right 05/10/2015  Procedure: RIGHT SHOULDER ARTHROSCOPY WITH BICEPS TENOTOMY, DEBRIDEMENT LABRAL TEAR;  Surgeon: Jones Broom, MD;  Location: [facility] OR;  Service: Orthopedics;  Laterality: Right;  Right shoulder arthroscopy biceps tenotomy, debridement labral tear . STUMP REVISION Left 01/21/2017  Procedure: . Revision Left Transmetatarsal Amputation;  Surgeon: Nadara Mustard, MD;  Location: [facility] OR;  Service: Orthopedics;  Laterality: Left; . STUMP REVISION Left 04/03/2017  Procedure: Revision Left Above Knee Amputation;  Surgeon: Nadara Mustard, MD;  Location: [facility] OR;  Service: Orthopedics;  Laterality: Left; . TONSILLECTOMY  1997 . TUBAL LIGATION  2006 HPI: [name] a 36 y.o.femalewith a history of ESRD on HD T TH S, COPD/ Asthma , D s/p MI 04/2015, HLD, HTN, CHF, chronic anemia, DM, and a history of L foot partial amputation 09/2016 with revision in 12/2016 for dehiscence, presenting to the ED with worsening LLE pain, swelling, increased drainage at the  stump, and chills. ETT 5/19 and trach'd 5/31. Hospital course included cardiac arrest, transfer to ICU No Data Recorded Assessment / Plan / Recommendation CHL IP CLINICAL IMPRESSIONS 04/17/2017 Clinical Impression   Pt demonstrates improved swallow function, particularly  with mastication of solids. Pt does still have silent trace penetration events during the swallow as swallow initiation is timely, but hyoid and excursion and complete airway closure is slightly slow intermittently. Despite this, penetration is minimal and is mostly ejected with subsequent swallows. Recommend intermittent throat clears with thin liquids and regular solids. SLP will follow for tolerance.  SLP Visit Diagnosis Dysphagia, oropharyngeal phase (R13.12) Attention and concentration deficit following -- Frontal lobe and executive function deficit following -- Impact on safety and function --   CHL IP TREATMENT RECOMMENDATION 04/17/2017 Treatment Recommendations Therapy as outlined in treatment plan below   Prognosis 03/25/2017 Prognosis for Safe Diet Advancement Good Barriers to Reach Goals Cognitive deficits Barriers/Prognosis Comment -- CHL IP DIET RECOMMENDATION 04/17/2017 SLP Diet Recommendations Regular solids;Thin liquid Liquid Administration via Cup;Straw Medication Administration Whole meds with puree Compensations Slow rate;Small sips/bites;Clear throat intermittently Postural Changes Remain semi-upright after after feeds/meals (Comment)   CHL IP OTHER RECOMMENDATIONS 04/17/2017 Recommended Consults -- Oral Care Recommendations Oral care BID Other Recommendations --   CHL IP FOLLOW UP RECOMMENDATIONS 04/17/2017 Follow up Recommendations Inpatient Rehab   CHL IP FREQUENCY AND DURATION 04/17/2017 Speech Therapy Frequency (ACUTE ONLY) min 2x/week Treatment Duration 2 weeks      CHL IP ORAL PHASE 04/17/2017 Oral Phase WFL Oral - Pudding Teaspoon -- Oral - Pudding Cup -- Oral - Honey Teaspoon -- Oral - Honey Cup -- Oral - Nectar Teaspoon -- Oral  - Nectar Cup -- Oral - Nectar Straw -- Oral - Thin Teaspoon -- Oral - Thin Cup -- Oral - Thin Straw -- Oral - Puree -- Oral - Mech Soft -- Oral - Regular -- Oral - Multi-Consistency -- Oral - Pill -- Oral Phase - Comment --  CHL IP PHARYNGEAL PHASE 04/17/2017 Pharyngeal Phase Impaired Pharyngeal- Pudding Teaspoon -- Pharyngeal -- Pharyngeal- Pudding Cup -- Pharyngeal -- Pharyngeal- Honey Teaspoon -- Pharyngeal -- Pharyngeal- Honey Cup -- Pharyngeal -- Pharyngeal- Nectar Teaspoon NT Pharyngeal -- Pharyngeal- Nectar Cup NT Pharyngeal -- Pharyngeal- Nectar Straw -- Pharyngeal -- Pharyngeal- Thin Teaspoon NT Pharyngeal -- Pharyngeal- Thin Cup Penetration/Aspiration during swallow Pharyngeal Material enters airway, CONTACTS cords and then ejected out;Material enters airway, remains ABOVE vocal cords and not ejected out;Material does not enter airway Pharyngeal- Thin Straw Penetration/Aspiration during swallow Pharyngeal Material does not enter airway;Material enters airway, remains ABOVE vocal cords and not ejected out;Material enters airway, CONTACTS cords and then ejected out Pharyngeal- Puree WFL Pharyngeal -- Pharyngeal- Mechanical Soft WFL Pharyngeal -- Pharyngeal- Regular WFL Pharyngeal -- Pharyngeal- Multi-consistency -- Pharyngeal -- Pharyngeal- Pill WFL Pharyngeal -- Pharyngeal Comment --  No flowsheet data found. No flowsheet data found. DeBlois, [name] 04/17/2017, 2:02 PM                   Scheduled Meds: . carvedilol  6.25 mg Oral BID WC  . darbepoetin (ARANESP) injection - DIALYSIS  200 mcg Intravenous Q Thu-HD  . dextrose  1 ampule Intravenous Once  . docusate sodium  100 mg Oral BID  . DULoxetine  30 mg Oral Daily  . feeding supplement (PRO-STAT SUGAR FREE 64)  30 mL Oral TID  . [name]'s butt cream   Topical QID  . heparin  5,000 Units Subcutaneous Q8H  . insulin aspart  0-15 Units Subcutaneous TID WC  . insulin glargine  8 Units Subcutaneous Daily  . mouth rinse  15 mL Mouth  Rinse BID  . multivitamin  1 tablet Oral QHS  . sodium chloride flush  3  mL Intravenous Q12H   Continuous Infusions: . sodium chloride    . sodium chloride       LOS: 62 days    Time spent:35 minutes.     Kathlen Mody, MD [facility] Pager [phone]  If 7PM-7AM, please contact night-coverage [email or web address] Password [facility] 04/17/2017, 3:53 PM

## 2017-04-17 NOTE — Progress Notes (Signed)
Modified Barium Swallow Progress Note  Patient Details  Name: [name] MRN: [id number] Date of Birth: 06-Aug-1981  Today's Date: 04/17/2017  Modified Barium Swallow completed.  Full report located under Chart Review in the Imaging Section.  Brief recommendations include the following:  Clinical Impression  Pt demonstrates improved swallow function, particularly with mastiction of solids. Pt does still have silent trace penetration events during the swallow as swallow initiation is timely, but hyoid and excursion and complete airway closure is slightly slow intermittently. Despite this, penetration is minimal and is mostly ejected with subsequent swallows. Recommend intermittent throat clears with thin liquids and regular solids. SLP will follow for tolerance.    Swallow Evaluation Recommendations       SLP Diet Recommendations: Regular solids;Thin liquid   Liquid Administration via: Cup;Straw   Medication Administration: Whole meds with puree   Supervision: Staff to assist with self feeding;Full supervision/cueing for compensatory strategies   Compensations: Slow rate;Small sips/bites;Clear throat intermittently   Postural Changes: Remain semi-upright after after feeds/meals (Comment)   Oral Care Recommendations: Oral care BID       Harlon Ditty, MA CCC-SLP [phone]  [name], [name] 04/17/2017,2:01 PM

## 2017-04-18 LAB — GLUCOSE, CAPILLARY
GLUCOSE-CAPILLARY: 93 mg/dL (ref 65–99)
GLUCOSE-CAPILLARY: 107 mg/dL — AB (ref 65–99)
GLUCOSE-CAPILLARY: 126 mg/dL — AB (ref 65–99)
Glucose-Capillary: 167 mg/dL — ABNORMAL HIGH (ref 65–99)
Glucose-Capillary: 131 mg/dL — ABNORMAL HIGH (ref 65–99)

## 2017-04-18 LAB — RENAL FUNCTION PANEL
ALBUMIN: 2.3 g/dL — AB (ref 3.5–5.0)
ANION GAP: 12 (ref 5–15)
BUN: 40 mg/dL — ABNORMAL HIGH (ref 6–20)
CO2: 27 mmol/L (ref 22–32)
Calcium: 9 mg/dL (ref 8.9–10.3)
Chloride: 92 mmol/L — ABNORMAL LOW (ref 101–111)
Creatinine, Ser: 4.64 mg/dL — ABNORMAL HIGH (ref 0.44–1.00)
GFR calc non Af Amer: 11 mL/min — ABNORMAL LOW (ref >60)
GFR, EST AFRICAN AMERICAN: 13 mL/min — AB (ref >60)
Glucose, Bld: 102 mg/dL — ABNORMAL HIGH (ref 65–99)
PHOSPHORUS: 4.9 mg/dL — AB (ref 2.5–4.6)
Potassium: 4.1 mmol/L (ref 3.5–5.1)
Sodium: 131 mmol/L — ABNORMAL LOW (ref 135–145)

## 2017-04-18 LAB — CBC
HCT: 30.3 % — ABNORMAL LOW (ref 36.0–46.0)
HEMOGLOBIN: 8.9 g/dL — AB (ref 12.0–15.0)
MCH: 28.8 pg (ref 26.0–34.0)
MCHC: 29.4 g/dL — AB (ref 30.0–36.0)
MCV: 98.1 fL (ref 78.0–100.0)
Platelets: 374 10*3/uL (ref 150–400)
RBC: 3.09 MIL/uL — AB (ref 3.87–5.11)
RDW: 18 % — ABNORMAL HIGH (ref 11.5–15.5)
WBC: 8.3 10*3/uL (ref 4.0–10.5)

## 2017-04-18 MED ORDER — ACETAMINOPHEN 325 MG PO TABS
ORAL_TABLET | ORAL | Status: AC
  Administered 2017-04-18: 650 mg via ORAL
  Filled 2017-04-18: qty 2

## 2017-04-18 NOTE — Progress Notes (Signed)
Dialysis treatment completed.  2000 mL ultrafiltrated and net fluid removal 1500 mL.    Patient status unchanged. Lung sounds diminished to ausculation in all fields. No edema. Cardiac: ST.  Disconnected lines and removed needles.  Pressure held for 10 minutes and band aid/gauze dressing applied.  Report given to bedside RN, Misty.

## 2017-04-18 NOTE — Progress Notes (Signed)
[facility] Rounding Note  Subjective:  No acute events. Feeling well.  Excited for discharge soon  Vital signs in last 24 hours: Vitals:   04/17/17 1953 04/18/17 0013 04/18/17 0442 04/18/17 0726  BP: 131/87 (!) 150/93 (!) 164/99 (!) 161/90  Pulse: 100 (!) 108 (!) 115 (!) 108  Resp: (!) 27 (!) 22 18   Temp: 98.1 F (36.7 C)  (!) 97.3 F (36.3 C) 97.7 F (36.5 C)  TempSrc: Oral  Oral Oral  SpO2: 97% 100% 100% 99%  Weight:   82 kg (180 lb 12.4 oz)   Height:       Weight change: 0 kg (0 lb)  Intake/Output Summary (Last 24 hours) at 04/18/17 3606 Last data filed at 04/17/17 1830  Gross per 24 hour  Intake              203 ml  Output                0 ml  Net              203 ml   Physical Exam: GEN Alert, NAD HEENT: prior trach site healing PULM: normal WOB, clear bilaterally CV tachycardic  ABD obese, soft and non tender EXT: Left AKA dressed, no edema, Right foot in protective boot Dialysis Access: Left AVF + bruit and thrill  Dialysis: TTS [facility] 4h   F200  114kg   2/2 bath  LUA AVF  Hep none - Iron Sucrose (Venofer) 50 mg IVP During Dialysis 1X Week  - Mircera 75 mcg IV q  2 weeks ( last on 02/05/17) - Vitamin D (Calcitriol) Oral 0.75 mcg  Po q hd   Summary: 36 year old female w/ ESRD d/t poorly controlled DM, HD bilateral TMA's, obesity, HTN, medical non-compliance, NICM. Admitted 5/19 w/left leg pain, + radiological evidence of osteomyelitis w/wound nonhealing, dehiscence, purulent drainage. Admitted for IV antibiotics, hemodialysis and possible surgery. Found unresponsive in her room that afternoon - cardiac arrest > rec'd  CPR, epi bicarb; intubated; cooled. Now with anoxic brain injury, trach and s/p L AKA this hosp. Course further complicated by recurrent aspiration PNA, dehiscence of AK wound requiring revision 7/6.   Assessment/ Plan: 1. Anoxic brain injury sp arrest - pt awake, speaking, eating now. Improved.  2. Trach - s/p decannulation 7/16. 3. Fever/ PNA /^WBC -  completed abx 7/12.  Resolved. 4. ESRD -  TTS HD, started HD June '17. Continue normal schedule. 5. HTN/ volume- BP's up, on low dose Coreg. Have d/c'd midodrine. EDW appears to be ~ 80- 82 kg. 6. Anemia- on max aranesp- hgb stable in 8's.  Repeat iron panel reordered, no need for iron 7. MBD of CKD - phos in range, Ca++ creeping up, calcitriol d/c'd. PTH 88 here.  If still here next week, need to repeat PTH. 8. 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  S/p woundvac. 9. DM2 - per primary 10. sHF - EF 25-30%; improving BP 11. Nutrition-  albumin 2.2.  Taking prostat. 12. Dispo-to SNF once her mom makes a decision on where to go.   Bufford Buttner MD [facility] pgr [phone]    Recent Labs Lab 04/13/17 1426 04/14/17 0730 04/16/17 1408  NA 139 141 133*  K 4.5 4.4 4.8  CL 99* 100* 94*  CO2 27 29 24   GLUCOSE 110* 116* 156*  BUN 36* 47* 51*  CREATININE 4.27* 5.00* 5.00*  CALCIUM 9.4 9.5 9.1  PHOS 4.6  4.8* 4.8*    Recent Labs Lab 04/13/17 1426 04/14/17 0730 04/16/17 1408  ALBUMIN 2.5* 2.4* 2.3*    Recent Labs Lab 04/13/17 1426 04/14/17 0730 04/16/17 1407  WBC 9.7 9.2 9.2  HGB 9.1* 8.9* 8.6*  HCT 32.5* 31.0* 29.6*  MCV 102.2* 100.0 98.0  PLT 434* 483* 399     Recent Labs Lab 04/17/17 1147 04/17/17 1550 04/17/17 2129 04/18/17 0605 04/18/17 0730  GLUCAP 225* 126* 155* 93 107*    Medications: Infusions: . sodium chloride    . sodium chloride      Scheduled Medications: . carvedilol  6.25 mg Oral BID WC  . darbepoetin (ARANESP) injection - DIALYSIS  200 mcg Intravenous Q Thu-HD  . dextrose  1 ampule Intravenous Once  . docusate sodium  100 mg Oral BID  . DULoxetine  30 mg Oral Daily  . feeding supplement (PRO-STAT SUGAR FREE 64)  30 mL Oral TID  . [name]'s butt cream   Topical QID  . heparin  5,000 Units Subcutaneous Q8H  . insulin aspart  0-15 Units Subcutaneous TID WC  . insulin glargine  8 Units Subcutaneous Daily  .  mouth rinse  15 mL Mouth Rinse BID  . multivitamin  1 tablet Oral QHS  . sodium chloride flush  3 mL Intravenous Q12H

## 2017-04-18 NOTE — Progress Notes (Signed)
Patient arrived to unit per chair.  Reviewed treatment plan and this RN agrees.  Report received from bedside RN, Nadine.  Consent verified.  Patient A & O X 3. Lung sounds diminished to ausculation in all fields. No edema. Cardiac: ST.  Prepped LUAVF with alcohol and cannulated with two 15 gauge needles.  Pulsation of blood noted.  Flushed access well with saline per protocol.  Connected and secured lines and initiated tx at 1543.  UF goal of 2000 mL and net fluid removal of 1500 mL.  Will continue to monitor.

## 2017-04-18 NOTE — Progress Notes (Signed)
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   Admitted on 02/14/17 to [facility] with left foot/leg pain, wound dehiscence and purulent drainage from the left trans-metatarsal amputation site.   She was subsequently found to be unresponsive on 5/19. Noted to bein asystole and CPR started with ROSC in approximately 9 minutes. She was intubated by anaesthesia, transferred to the intensive care unit and subsequently underwent cooling protocol. She couldn't be weaned off the ventilator and as a result underwent tracheostomy on 5/31. She underwent decannulation on 7/16.  Neurological/ Cognitive status has been poor. She has been evaluated by neurology on 5/29 and, per neurology, chances of meaningful neurological recovery appeared to be dismal.  Currently she is able to follow simple commands and is alert and is eating.  Hospital course complicated by persistent leukocytosis, respiratory failure and left transmetatarsal wound dehiscence with gangrene. 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  Over the course of her hospitalizations, she has been treated for multiple episodes of recurrent aspiration pneumonias.  Currently off antibiotics and awaiting placement.  No new issues overnight, currently awaiting SNF placement.   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)   Diabetic osteomyelitis (HCC)   Dehiscence of amputation stump (HCC)   Osteomyelitis (HCC)   Chronic pain syndrome   Cardiac arrest, cause unspecified (HCC)   Pressure injury of skin   Wound dehiscence   Coma (HCC)   Elevated liver enzymes   Jaundice   Encounter for PEG (percutaneous endoscopic gastrostomy) (HCC)   Protein calorie malnutrition (HCC)   DNR (do not resuscitate) discussion   Palliative care by specialist   Tachycardia   Goals of care, counseling/discussion   Anoxic brain injury [facility])   Palliative care encounter   Tracheostomy status (HCC)   Abscess of left leg   Hypoxemia   Fever   Aspiration into airway   Acute on chronic respiratory failure, in the setting of cardiac arrest with superimposed recurrent aspiration pneumonias during her hospitalization.  S/p intubation on 5/19 and extubated 5/31, tracheostomy, and de cannulated on 7/16.  - currently on RA, and off antibiotics.  - PEG on hold and on dysphagia diet and mom at bedside, reports good appetite.  - no respiratory issues so far.  - no new medications.    ESRD on HD  TTS per nephrology.  Nephrology on board and recommendations given.  Tolerated HD in chair well.  Midodrine discontinued as her BP improved.  BP slightly higher than normal today, since she is due for HD today, we will not lower it.  She remains asymptomatic.      Left trans metatarsal stump wound dehiscence with infection and gangrene: - underwent AKA, because of wound dehiscence and osteomyelitis. She underwent revision of the AKA on 7/6.  -  dry dressings as needed.  - off the wound vac.  - no changes.    Right heel eschar/ pressure ulcers on the sacral area:  Wound care consulted and recommendations given.   Type 2 DM:  CBG (last 3)   Recent Labs  04/17/17 2129 04/18/17 0605 04/18/17 0730    GLUCAP 155* 93 107*    Resume SSI and lantus of 8 units.  No change in insulin regimen.    Anoxic brain injury:  Slow cognitive improvement  Following some commands.  No agitation. Eating breakfast by herself.    Normocytic anemia:  Anemia of chronic disease worsened by acute illness .  Hemoglobin stable around 8.    NICM, WITH chronic systolic heart failure:  She appears to be compensated.  Fluid management with HD.  Resume coreg. Hold for SBP<90MMHG.    Pericardial effusion without evidence of tamponade physiology.   Moderate protein calorie malnutrition:  Dietary consulted and recommendations given.   Depression;  Resumed home cymbalta.    Dysphagia:  On dys 2 diet with nectar thick liquids.   Hypertension:  bp slightly high this am, holding off on adding bp meds as she has HD today.    CIR consutled to see if she is a candidate for acute rehab. But deferred to SNF. Pt's mom will see facilities tomorrow and plan for discharge when bed available.    DVT prophylaxis:heparin sq Code Status: full code.  Family Communication: mom at bedside.  Disposition Plan:SNF placement when bed available. Pt stable for discharge.   Consultants:   PCCM as needed  Nephrology  Palliative care consult   Orthopedics,    Procedures: STUDIES:  Lower extremity ultrasound 5/18 >> no evidence of DVT CT head 5/19 >> normal exam Echo 5/20 >> EF 30-35%, increased LVF. Diffuse hypokinesis, akinesis of basilar mid-inferior myocardium, grade 2 diastolic dysfxn  EEG 5/20 >> finding c/w mod to severe global cerebral dysfxn. C/w anoxic injury given clinical course  LE [place] 5/21 >> negative MRI brain 5/24 >> ?thrombosed cortical vein, otherwise normal TEE 6/4 >> mild MR, normal AV, mild TR, mild PR, EF 30-35%, diffuse hypokinesis, no thrombus, no PFO, normal RV, moderate pericardial effusion with synechia suggesting some chronicity, no vegetations   Antimicrobials: Clinidamycin 5/19  >> 5/21 Vancomycin 5/19 >> 5/25 >> restart 5/30 Zosyn 5/19 >> 5/22 Meropenem 5/22 >> 5/30 Flagyl 5/30 >> off Ceftriaxone 5/30 >> 6/4  SIGNIFICANT EVENTS: 5/19  Admit, cardiac arrest, transfer to ICU; hypothermia protocol started 5/24  Off pressors.  MRI w/out evidence of ischemia  6/03  Tolerated ATC couple hours, then back to vent, HD  7/11  Trach capped  7/16  On RA.  Trach dislodged over the weekend, replaced without difficulty. Decannulated late PM  LINES/TUBES: ETT 5/19 >> 5/31 Lt IJ CVL 5/19 >> out Trach 5/31 >> 7/16  Subjective: No new complaints.  No chest pain or sob.   Objective: Vitals:   04/17/17 1953 04/18/17 0013 04/18/17 0442 04/18/17 0726  BP: 131/87 (!) 150/93 (!) 164/99 (!) 161/90  Pulse: 100 (!) 108 (!) 115 (!) 108  Resp: (!) 27 (!) 22 18   Temp: 98.1 F (36.7 C)  (!) 97.3 F (36.3 C) 97.7 F (36.5 C)  TempSrc: Oral  Oral Oral  SpO2: 97% 100% 100% 99%  Weight:   82 kg (180 lb 12.4 oz)   Height:        Intake/Output Summary (Last 24 hours) at 04/18/17 7253 Last data filed at  04/17/17 1830  Gross per 24 hour  Intake              203 ml  Output                0 ml  Net              203 ml   Filed Weights   04/16/17 0500 04/17/17 0500 04/18/17 0442  Weight: 81 kg (178 lb 9.2 oz) 82 kg (180 lb 12.4 oz) 82 kg (180 lb 12.4 oz)    Examination: stable.  General exam: Appears calm and comfortable on RA.  Respiratory system: Good air entry. No wheezing or rhonchi.  Cardiovascular system: S1 & S2 heard, no murmer, tachycardic.   Gastrointestinal system: Abdomen issoft NT ND BS+ Central nervous system:alert , following some commands. Able to move all extremities.   Extremities: left Aka. Normal tone.  Skin: sacral area ulcer, right heel ischar.  Psychiatry: no agitation, calm.     Data Reviewed: I have personally reviewed following labs and imaging studies  CBC:  Recent Labs Lab 04/13/17 1426 04/14/17 0730 04/16/17 1407  WBC 9.7 9.2 9.2    HGB 9.1* 8.9* 8.6*  HCT 32.5* 31.0* 29.6*  MCV 102.2* 100.0 98.0  PLT 434* 483* 399   Basic Metabolic Panel:  Recent Labs Lab 04/13/17 1426 04/14/17 0730 04/16/17 1408  NA 139 141 133*  K 4.5 4.4 4.8  CL 99* 100* 94*  CO2 27 29 24   GLUCOSE 110* 116* 156*  BUN 36* 47* 51*  CREATININE 4.27* 5.00* 5.00*  CALCIUM 9.4 9.5 9.1  PHOS 4.6 4.8* 4.8*   GFR: Estimated Creatinine Clearance: 17.5 mL/min (A) (by C-G formula based on SCr of 5 mg/dL (H)). Liver Function Tests:  Recent Labs Lab 04/13/17 1426 04/14/17 0730 04/16/17 1408  ALBUMIN 2.5* 2.4* 2.3*   No results for input(s): LIPASE, AMYLASE in the last 168 hours. No results for input(s): AMMONIA in the last 168 hours. Coagulation Profile: No results for input(s): INR, PROTIME in the last 168 hours. Cardiac Enzymes: No results for input(s): CKTOTAL, CKMB, CKMBINDEX, TROPONINI in the last 168 hours. BNP (last 3 results) No results for input(s): PROBNP in the last 8760 hours. HbA1C: No results for input(s): HGBA1C in the last 72 hours. CBG:  Recent Labs Lab 04/17/17 1147 04/17/17 1550 04/17/17 2129 04/18/17 0605 04/18/17 0730  GLUCAP 225* 126* 155* 93 107*   Lipid Profile: No results for input(s): CHOL, HDL, LDLCALC, TRIG, CHOLHDL, LDLDIRECT in the last 72 hours. Thyroid Function Tests: No results for input(s): TSH, T4TOTAL, FREET4, T3FREE, THYROIDAB in the last 72 hours. Anemia Panel:  Recent Labs  04/16/17 1410  FERRITIN 1,338*  TIBC 157*  IRON 56   Sepsis Labs: No results for input(s): PROCALCITON, LATICACIDVEN in the last 168 hours.  No results found for this or any previous visit (from the past 240 hour(s)).       Radiology Studies: Dg Swallowing Func-speech Pathology  Result Date: 04/17/2017 Objective Swallowing Evaluation: Type of Study: MBS-Modified Barium Swallow Study Patient Details Name: Idali Lafever MRN: [id number] Date of Birth: 1981-03-09 Today's Date: 04/17/2017 Time: SLP Start Time  (ACUTE ONLY): 1100-SLP Stop Time (ACUTE ONLY): 1130 SLP Time Calculation (min) (ACUTE ONLY): 30 min Past Medical History: Past Medical History: Diagnosis Date . Anemia  . Arthritis   "left hand" (09/15/2013) . Asthma  . CHF (congestive heart failure) (HCC)  . Chronic bronchitis (HCC)   "just about q yr" (09/15/2013) .  Chronic kidney disease   "low kidney function" (09/15/2013) ,  T/Th/Sa . COPD (chronic obstructive pulmonary disease) (HCC)  . Coronary artery disease  . Hyperlipidemia  . Hypertension  . Migraine   "get them alot" (09/15/2013) . Myocardial infarction [facility]) 04/2015  NSTEMI . Normal coronary arteries   by cardiac catheterization 09/20/13 . Peripheral vascular disease (HCC)  . Pneumonia   "couple times; have it now" (09/15/2013) . PONV (postoperative nausea and vomiting)  . Restless legs  . Shortness of breath   "just recently; related to the pneumonia" (09/15/2013) . Type 1 diabetes mellitus (HCC)   type 2 Past Surgical History: Past Surgical History: Procedure Laterality Date . AMPUTATION Right 06/25/2016  Procedure: Amputation Right Great Toe at the Metatarsophalangeal Joint;  Surgeon: Nadara Mustard, MD;  Location: [facility] OR;  Service: Orthopedics;  Laterality: Right; . AMPUTATION Bilateral 10/08/2016  Procedure: Bilateral Transmetatarsal Amputation;  Surgeon: Nadara Mustard, MD;  Location: [facility] OR;  Service: Orthopedics;  Laterality: Bilateral; . AMPUTATION Left 03/13/2017  Procedure: AMPUTATION ABOVE KNEE;  Surgeon: Nadara Mustard, MD;  Location: [facility] OR;  Service: Orthopedics;  Laterality: Left; . APPLICATION OF WOUND VAC Left 04/03/2017  Procedure: Application of Wound Vacuum;  Surgeon: Nadara Mustard, MD;  Location[facility] OR;  Service: Orthopedics;  Laterality: Left; . AV FISTULA PLACEMENT Left 03/27/2015  Procedure: CREATION RADIAL CEPHALIC ARTERIOVENOUS FISTULA;  Surgeon: Chuck Hint, MD;  Location: [facility] OR;  Service: Vascular;  Laterality: Left; . AV FISTULA PLACEMENT Left 11/23/2015  Procedure:  LEFT ARM  BASILIC VEIN TRANSPOSITION  ;  Surgeon: Chuck Hint, MD;  Location: [facility] OR;  Service: Vascular;  Laterality: Left; . CESAREAN SECTION  1999; 2006 . CORONARY ANGIOGRAM  09/20/2013  Procedure: CORONARY ANGIOGRAM;  Surgeon: Runell Gess, MD;  Location: [facility] CATH LAB;  Service: Cardiovascular;; . FINGER SURGERY Left 1985  3rd and 4th digits reconstructed after cut off" (09/15/2013) . IR FLUORO GUIDE CV LINE RIGHT  03/11/2017 . IR US GUIDE VASC ACCESS RIGHT  03/11/2017 . PERIPHERAL VASCULAR CATHETERIZATION N/A 09/05/2016  Procedure: Abdominal Aortogram;  Surgeon: Sherren Kerns, MD;  Location: [facility] INVASIVE CV LAB;  Service: Cardiovascular;  Laterality: N/A; . PERIPHERAL VASCULAR CATHETERIZATION Bilateral 09/05/2016  Procedure: Lower Extremity Angiography;  Surgeon: Sherren Kerns, MD;  Location: [facility] INVASIVE CV LAB;  Service: Cardiovascular;  Laterality: Bilateral; . PERIPHERAL VASCULAR CATHETERIZATION Right 09/05/2016  Procedure: Peripheral Vascular Balloon Angioplasty;  Surgeon: Sherren Kerns, MD;  Location: [facility] INVASIVE CV LAB;  Service: Cardiovascular;  Laterality: Right;  peroneal and AT . SHOULDER ARTHROSCOPY WITH BICEPSTENOTOMY Right 05/10/2015  Procedure: RIGHT SHOULDER ARTHROSCOPY WITH BICEPS TENOTOMY, DEBRIDEMENT LABRAL TEAR;  Surgeon: Jones Broom, MD;  Location: [facility] OR;  Service: Orthopedics;  Laterality: Right;  Right shoulder arthroscopy biceps tenotomy, debridement labral tear . STUMP REVISION Left 01/21/2017  Procedure: . Revision Left Transmetatarsal Amputation;  Surgeon: Nadara Mustard, MD;  Location: [facility] OR;  Service: Orthopedics;  Laterality: Left; . STUMP REVISION Left 04/03/2017  Procedure: Revision Left Above Knee Amputation;  Surgeon: Nadara Mustard, MD;  Location: [facility] OR;  Service: Orthopedics;  Laterality: Left; . TONSILLECTOMY  1997 . TUBAL LIGATION  2006 HPI: Dona Walby a 36 y.o.femalewith a history of ESRD on HD T TH S, COPD/ Asthma , D s/p MI 04/2015, HLD, HTN, CHF, chronic anemia,  DM, and a history of L foot partial amputation 09/2016 with revision in 12/2016 for dehiscence, presenting to the ED with worsening LLE pain, swelling, increased  drainage at the stump, and chills. ETT 5/19 and trach'd 5/31. Hospital course included cardiac arrest, transfer to ICU No Data Recorded Assessment / Plan / Recommendation CHL IP CLINICAL IMPRESSIONS 04/17/2017 Clinical Impression   Pt demonstrates improved swallow function, particularly with mastication of solids. Pt does still have silent trace penetration events during the swallow as swallow initiation is timely, but hyoid and excursion and complete airway closure is slightly slow intermittently. Despite this, penetration is minimal and is mostly ejected with subsequent swallows. Recommend intermittent throat clears with thin liquids and regular solids. SLP will follow for tolerance.  SLP Visit Diagnosis Dysphagia, oropharyngeal phase (R13.12) Attention and concentration deficit following -- Frontal lobe and executive function deficit following -- Impact on safety and function --   CHL IP TREATMENT RECOMMENDATION 04/17/2017 Treatment Recommendations Therapy as outlined in treatment plan below   Prognosis 03/25/2017 Prognosis for Safe Diet Advancement Good Barriers to Reach Goals Cognitive deficits Barriers/Prognosis Comment -- CHL IP DIET RECOMMENDATION 04/17/2017 SLP Diet Recommendations Regular solids;Thin liquid Liquid Administration via Cup;Straw Medication Administration Whole meds with puree Compensations Slow rate;Small sips/bites;Clear throat intermittently Postural Changes Remain semi-upright after after feeds/meals (Comment)   CHL IP OTHER RECOMMENDATIONS 04/17/2017 Recommended Consults -- Oral Care Recommendations Oral care BID Other Recommendations --   CHL IP FOLLOW UP RECOMMENDATIONS 04/17/2017 Follow up Recommendations Inpatient Rehab   CHL IP FREQUENCY AND DURATION 04/17/2017 Speech Therapy Frequency (ACUTE ONLY) min 2x/week Treatment Duration 2  weeks      CHL IP ORAL PHASE 04/17/2017 Oral Phase WFL Oral - Pudding Teaspoon -- Oral - Pudding Cup -- Oral - Honey Teaspoon -- Oral - Honey Cup -- Oral - Nectar Teaspoon -- Oral - Nectar Cup -- Oral - Nectar Straw -- Oral - Thin Teaspoon -- Oral - Thin Cup -- Oral - Thin Straw -- Oral - Puree -- Oral - Mech Soft -- Oral - Regular -- Oral - Multi-Consistency -- Oral - Pill -- Oral Phase - Comment --  CHL IP PHARYNGEAL PHASE 04/17/2017 Pharyngeal Phase Impaired Pharyngeal- Pudding Teaspoon -- Pharyngeal -- Pharyngeal- Pudding Cup -- Pharyngeal -- Pharyngeal- Honey Teaspoon -- Pharyngeal -- Pharyngeal- Honey Cup -- Pharyngeal -- Pharyngeal- Nectar Teaspoon NT Pharyngeal -- Pharyngeal- Nectar Cup NT Pharyngeal -- Pharyngeal- Nectar Straw -- Pharyngeal -- Pharyngeal- Thin Teaspoon NT Pharyngeal -- Pharyngeal- Thin Cup Penetration/Aspiration during swallow Pharyngeal Material enters airway, CONTACTS cords and then ejected out;Material enters airway, remains ABOVE vocal cords and not ejected out;Material does not enter airway Pharyngeal- Thin Straw Penetration/Aspiration during swallow Pharyngeal Material does not enter airway;Material enters airway, remains ABOVE vocal cords and not ejected out;Material enters airway, CONTACTS cords and then ejected out Pharyngeal- Puree WFL Pharyngeal -- Pharyngeal- Mechanical Soft WFL Pharyngeal -- Pharyngeal- Regular WFL Pharyngeal -- Pharyngeal- Multi-consistency -- Pharyngeal -- Pharyngeal- Pill WFL Pharyngeal -- Pharyngeal Comment --  No flowsheet data found. No flowsheet data found. DeBlois, [name] 04/17/2017, 2:02 PM                   Scheduled Meds: . carvedilol  6.25 mg Oral BID WC  . darbepoetin (ARANESP) injection - DIALYSIS  200 mcg Intravenous Q Thu-HD  . dextrose  1 ampule Intravenous Once  . docusate sodium  100 mg Oral BID  . DULoxetine  30 mg Oral Daily  . feeding supplement (PRO-STAT SUGAR FREE 64)  30 mL Oral TID  . [name]'s butt cream   Topical  QID  . heparin  5,000 Units Subcutaneous Q8H  .  insulin aspart  0-15 Units Subcutaneous TID WC  . insulin glargine  8 Units Subcutaneous Daily  . mouth rinse  15 mL Mouth Rinse BID  . multivitamin  1 tablet Oral QHS  . sodium chloride flush  3 mL Intravenous Q12H   Continuous Infusions: . sodium chloride    . sodium chloride       LOS: 63 days    Time spent:25 minutes.     Kathlen Mody, MD [facility] Pager [phone]  If 7PM-7AM, please contact night-coverage [email or web address] Password [email or web address] 04/18/2017, 9:08 AM

## 2017-04-19 LAB — GLUCOSE, CAPILLARY
GLUCOSE-CAPILLARY: 160 mg/dL — AB (ref 65–99)
Glucose-Capillary: 98 mg/dL (ref 65–99)
Glucose-Capillary: 158 mg/dL — ABNORMAL HIGH (ref 65–99)
Glucose-Capillary: 113 mg/dL — ABNORMAL HIGH (ref 65–99)
Glucose-Capillary: 162 mg/dL — ABNORMAL HIGH (ref 65–99)

## 2017-04-19 NOTE — Progress Notes (Signed)
[facility] Rounding Note  Subjective:  Tolerated HD well yesterday.  No acute events. Mom to look at more SNFs on Monday.    Vital signs in last 24 hours: Vitals:   04/18/17 2027 04/18/17 2300 04/19/17 0527 04/19/17 0735  BP: (!) 137/91 91/81 (!) 131/94 (!) 93/49  Pulse:   (!) 103 (!) 105  Resp:  20 13 16   Temp: 98 F (36.7 C) 97.9 F (36.6 C) 97.9 F (36.6 C) 97.9 F (36.6 C)  TempSrc: Oral Oral Oral Oral  SpO2: 99% 100% 97% 99%  Weight:   79 kg (174 lb 2.6 oz)   Height:       Weight change: 0 kg (0 lb)  Intake/Output Summary (Last 24 hours) at 04/19/17 0946 Last data filed at 04/18/17 1945  Gross per 24 hour  Intake                0 ml  Output             1500 ml  Net            -1500 ml   Physical Exam: GEN Alert, NAD HEENT: prior trach site healing PULM: normal WOB, clear bilaterally CV tachycardic  ABD obese, soft and non tender EXT: Left AKA dressed, no edema, Right foot in protective boot Dialysis Access: Left AVF + bruit and thrill  Dialysis: TTS [facility] 4h   F200  114kg   2/2 bath  LUA AVF  Hep none - Iron Sucrose (Venofer) 50 mg IVP During Dialysis 1X Week  - Mircera 75 mcg IV q  2 weeks ( last on 02/05/17) - Vitamin D (Calcitriol) Oral 0.75 mcg  Po q hd   Summary: 36 year old female w/ ESRD d/t poorly controlled DM, HD bilateral TMA's, obesity, HTN, medical non-compliance, NICM. Admitted 5/19 w/left leg pain, + radiological evidence of osteomyelitis w/wound nonhealing, dehiscence, purulent drainage. Admitted for IV antibiotics, hemodialysis and possible surgery. Found unresponsive in her room that afternoon - cardiac arrest > rec'd  CPR, epi bicarb; intubated; cooled. Now with anoxic brain injury, trach and s/p L AKA this hosp. Course further complicated by recurrent aspiration PNA, dehiscence of AK wound requiring revision 7/6.  Now decannulated, stable and ready for discharge pending choosing a SNF.   Assessment/ Plan: 1. Anoxic brain injury: s/p cardiac arrest.   Much improved. 2. Acute hypoxic respiratory failure: required trach, now decannulated 7/16. 3. ESRD: TTS HD.  Continue normal schedule.  No heparin 4. HTN/ volume: On low-dose coreg.  Required midodrine before, now d/c'd.  EDW appears to be ~ 80 kg. 5. Anemia: Darbopoetin 200 q week.  Repeat iron panel with Tsat 36% 7/19, no need for IV iron 6. Bone/ Mineral:  Renal diet, no binder, calcitriol d/c'd due to increasing Ca.  PTH 88 here.  If still here next week, need to repeat PTH. 7. 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  S/p woundvac, now being dressed. 8. DM 2:  per primary 9. Chronic systolic CHF: EF 91-66%; improving BP, on BB 10. Nutrition: albumin 2.2.  Taking prostat. 11. Dispo: to SNF once her mom makes a decision on where to go.   Bufford Buttner MD [facility] pgr [phone]    Recent Labs Lab 04/14/17 0730 04/16/17 1408 04/18/17 1729  NA 141 133* 131*  K 4.4 4.8 4.1  CL 100* 94* 92*  CO2 29 24 27   GLUCOSE 116* 156* 102*  BUN 47* 51*  40*  CREATININE 5.00* 5.00* 4.64*  CALCIUM 9.5 9.1 9.0  PHOS 4.8* 4.8* 4.9*    Recent Labs Lab 04/14/17 0730 04/16/17 1408 04/18/17 1729  ALBUMIN 2.4* 2.3* 2.3*    Recent Labs Lab 04/13/17 1426 04/14/17 0730 04/16/17 1407 04/18/17 1730  WBC 9.7 9.2 9.2 8.3  HGB 9.1* 8.9* 8.6* 8.9*  HCT 32.5* 31.0* 29.6* 30.3*  MCV 102.2* 100.0 98.0 98.1  PLT 434* 483* 399 374     Recent Labs Lab 04/18/17 0730 04/18/17 1202 04/18/17 1449 04/18/17 2122 04/19/17 0734  GLUCAP 107* 167* 131* 126* 98    Medications: Infusions: . sodium chloride    . sodium chloride      Scheduled Medications: . carvedilol  6.25 mg Oral BID WC  . darbepoetin (ARANESP) injection - DIALYSIS  200 mcg Intravenous Q Thu-HD  . dextrose  1 ampule Intravenous Once  . docusate sodium  100 mg Oral BID  . DULoxetine  30 mg Oral Daily  . feeding supplement (PRO-STAT SUGAR FREE 64)  30 mL Oral TID  . [name]'s butt cream    Topical QID  . heparin  5,000 Units Subcutaneous Q8H  . insulin aspart  0-15 Units Subcutaneous TID WC  . insulin glargine  8 Units Subcutaneous Daily  . mouth rinse  15 mL Mouth Rinse BID  . multivitamin  1 tablet Oral QHS  . sodium chloride flush  3 mL Intravenous Q12H

## 2017-04-19 NOTE — Clinical Social Work Note (Signed)
CSW received call from RN staing pt's mom asking to speak with CSW. CSW met with pt' mom at bedside. Mom states she toured [facility] and declined this offer. CSW provided mom with current offers. Mom not interested in [facility], [facility], or [facility]. Mom states she is touring [facility] and [facility], but may need more time. CSW asked what more time would be needed for if touring these facilities tomorrow. Mom states she needs more time to figure out how she will "make it without her (pt's) check." CSW explained that when pt is medically ready for discharge and has bed offers available, we cannot continue to hold pt here for that reason. CSW asked mom to please provide decision tomorrow after tour of facilities.   CSW also discussed with mom possible resources to assist her in caring for pt's children. CSW discussed food and nutrition services and Medicaid, as well as provided [facility] phone number and address. CSW encouraged mom to reach out to [facility] to see if she may be eligible for any other services to assist with caring for pt's children. CSW also provided crisis assistance resources to mom. CSW continuing to follow for discharge needs.   Oretha Ellis, [place], [place] Work (Weekend Coverage) [phone]

## 2017-04-19 NOTE — Progress Notes (Signed)
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   Admitted on 02/14/17 to [facility] with left foot/leg pain, wound dehiscence and purulent drainage from the left trans-metatarsal amputation site.   She was subsequently found to be unresponsive on 5/19. Noted to bein asystole and CPR started with ROSC in approximately 9 minutes. She was intubated by anaesthesia, transferred to the intensive care unit and subsequently underwent cooling protocol. She couldn't be weaned off the ventilator and as a result underwent tracheostomy on 5/31. She underwent decannulation on 7/16.  Neurological/ Cognitive status has been poor. She has been evaluated by neurology on 5/29 and, per neurology, chances of meaningful neurological recovery appeared to be dismal.  Currently she is able to follow simple commands and is alert and is eating.  Hospital course complicated by persistent leukocytosis, respiratory failure and left transmetatarsal wound dehiscence with gangrene. 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  Over the course of her hospitalizations, she has been treated for multiple episodes of recurrent aspiration pneumonias.  Currently off antibiotics and awaiting placement.  No new issues overnight, currently awaiting SNF placement.   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)   Diabetic osteomyelitis (HCC)   Dehiscence of amputation stump (HCC)   Osteomyelitis (HCC)   Chronic pain syndrome   Cardiac arrest, cause unspecified (HCC)   Pressure injury of skin   Wound dehiscence   Coma (HCC)   Elevated liver enzymes   Jaundice   Encounter for PEG (percutaneous endoscopic gastrostomy) (HCC)   Protein calorie malnutrition (HCC)   DNR (do not resuscitate) discussion   Palliative care by specialist   Tachycardia   Goals of care, counseling/discussion   Anoxic brain injury [facility])   Palliative care encounter   Tracheostomy status (HCC)   Abscess of left leg   Hypoxemia   Fever   Aspiration into airway   Acute on chronic respiratory failure, in the setting of cardiac arrest with superimposed recurrent aspiration pneumonias during her hospitalization.  S/p intubation on 5/19 and extubated 5/31, tracheostomy, and de cannulated on 7/16.  - currently on RA, and off antibiotics.  - PEG on hold and on dysphagia diet and mom at bedside, reports good appetite.  - no respiratory issues so far.  - no new complaints.    ESRD on HD  TTS per nephrology.  Nephrology on board and recommendations given.  Tolerated HD in chair well.  Off midodrine. bp on the low normal. Monitor to see if she needs to be started on midodrine.      Left trans metatarsal stump wound dehiscence with infection and gangrene: - underwent AKA, because of wound dehiscence and osteomyelitis. She underwent revision of the AKA on 7/6.  - dry dressings as needed.  - off the wound vac.  -  no changes.  - in bed today.    Right heel eschar/ pressure ulcers on the sacral area:  Wound care consulted and recommendations given.  Dry dressings.   Type 2 DM:  CBG (last 3)   Recent Labs  04/19/17 0734 04/19/17 1231 04/19/17 1434  GLUCAP 98 158* 160*    Resume SSI and  lantus of 8 units.  No change in insulin regimen.    Anoxic brain injury:  Slow cognitive improvement  Following some commands and talking.  No agitation.     Normocytic anemia:  Anemia of chronic disease worsened by acute illness .  Hemoglobin stable around 8.  darbopoetin once every week.    NICM, WITH chronic systolic heart failure:  She appears to be compensated.  Fluid management with HD.  Resume coreg. Hold for SBP<90MMHG.    Pericardial effusion without evidence of tamponade physiology.   Moderate protein calorie malnutrition:  Dietary consulted and recommendations given.   Depression;  Resumed home cymbalta.    Dysphagia:  On dys 2 diet with nectar thick liquids.   Hypertension:  bp low normal today. Asymptomatic. Just on coreg.   CIR consutled to see if she is a candidate for acute rehab. But deferred to SNF. Mother did not like the [facility] and she is going to see the rest of them tomorrow.  Patient is medically stable for discharge and we are awaiting placement.   DVT prophylaxis:heparin sq Code Status: full code.  Family Communication: mom at bedside.  Disposition Plan:SNF placement when bed available. Pt stable for discharge.   Consultants:   PCCM as needed  Nephrology  Palliative care consult   Orthopedics,    Procedures: STUDIES:  Lower extremity ultrasound 5/18 >> no evidence of DVT CT head 5/19 >> normal exam Echo 5/20 >> EF 30-35%, increased LVF. Diffuse hypokinesis, akinesis of basilar mid-inferior myocardium, grade 2 diastolic dysfxn  EEG 5/20 >> finding c/w mod to severe global cerebral dysfxn. C/w anoxic injury given clinical course  LE [place] 5/21 >> negative MRI brain 5/24 >> ?thrombosed cortical vein, otherwise normal TEE 6/4 >> mild MR, normal AV, mild TR, mild PR, EF 30-35%, diffuse hypokinesis, no thrombus, no PFO, normal RV, moderate pericardial effusion with synechia suggesting some chronicity, no vegetations    Antimicrobials: Clinidamycin 5/19 >> 5/21 Vancomycin 5/19 >> 5/25 >> restart 5/30 Zosyn 5/19 >> 5/22 Meropenem 5/22 >> 5/30 Flagyl 5/30 >> off Ceftriaxone 5/30 >> 6/4  SIGNIFICANT EVENTS: 5/19  Admit, cardiac arrest, transfer to ICU; hypothermia protocol started 5/24  Off pressors.  MRI w/out evidence of ischemia  6/03  Tolerated ATC couple hours, then back to vent, HD  7/11  Trach capped  7/16  On RA.  Trach dislodged over the weekend, replaced without difficulty. Decannulated late PM  LINES/TUBES: ETT 5/19 >> 5/31 Lt IJ CVL 5/19 >> out Trach 5/31 >> 7/16  Subjective: No new complaints.  No chest pain or sob. Appears cheerful.   Objective: Vitals:   04/18/17 2027 04/18/17 2300 04/19/17 0527 04/19/17 0735  BP: (!) 137/91 91/81 (!) 131/94 (!) 93/49  Pulse:   (!) 103 (!) 105  Resp:  20 13 16   Temp: 98 F (36.7 C) 97.9 F (36.6 C) 97.9 F (36.6 C) 97.9 F (36.6 C)  TempSrc: Oral Oral Oral Oral  SpO2: 99% 100% 97% 99%  Weight:   79 kg (174 lb 2.6 oz)   Height:        Intake/Output Summary (Last 24 hours) at  04/19/17 1612 Last data filed at 04/18/17 1945  Gross per 24 hour  Intake                0 ml  Output             1500 ml  Net            -1500 ml   Filed Weights   04/18/17 0442 04/18/17 1531 04/19/17 0527  Weight: 82 kg (180 lb 12.4 oz) 82 kg (180 lb 12.4 oz) 79 kg (174 lb 2.6 oz)    Examination: stable.   General exam: Appears calm and comfortable on RA.  Respiratory system: Good air entry. No wheezing or rhonchi.  Cardiovascular system: S1 & S2 heard, no murmer, tachycardic.   Gastrointestinal system: Abdomen is soft NT ND BS+ Central nervous system:alert , following some commands. Able to move all extremities.   Extremities: left Aka. Normal tone. Right lower extremity in boot. Skin: sacral area ulcer, right heel ischar.  Psychiatry: no agitation, calm.     Data Reviewed: I have personally reviewed following labs and imaging  studies  CBC:  Recent Labs Lab 04/13/17 1426 04/14/17 0730 04/16/17 1407 04/18/17 1730  WBC 9.7 9.2 9.2 8.3  HGB 9.1* 8.9* 8.6* 8.9*  HCT 32.5* 31.0* 29.6* 30.3*  MCV 102.2* 100.0 98.0 98.1  PLT 434* 483* 399 374   Basic Metabolic Panel:  Recent Labs Lab 04/13/17 1426 04/14/17 0730 04/16/17 1408 04/18/17 1729  NA 139 141 133* 131*  K 4.5 4.4 4.8 4.1  CL 99* 100* 94* 92*  CO2 27 29 24 27   GLUCOSE 110* 116* 156* 102*  BUN 36* 47* 51* 40*  CREATININE 4.27* 5.00* 5.00* 4.64*  CALCIUM 9.4 9.5 9.1 9.0  PHOS 4.6 4.8* 4.8* 4.9*   GFR: Estimated Creatinine Clearance: 18.5 mL/min (A) (by C-G formula based on SCr of 4.64 mg/dL (H)). Liver Function Tests:  Recent Labs Lab 04/13/17 1426 04/14/17 0730 04/16/17 1408 04/18/17 1729  ALBUMIN 2.5* 2.4* 2.3* 2.3*   No results for input(s): LIPASE, AMYLASE in the last 168 hours. No results for input(s): AMMONIA in the last 168 hours. Coagulation Profile: No results for input(s): INR, PROTIME in the last 168 hours. Cardiac Enzymes: No results for input(s): CKTOTAL, CKMB, CKMBINDEX, TROPONINI in the last 168 hours. BNP (last 3 results) No results for input(s): PROBNP in the last 8760 hours. HbA1C: No results for input(s): HGBA1C in the last 72 hours. CBG:  Recent Labs Lab 04/18/17 1449 04/18/17 2122 04/19/17 0734 04/19/17 1231 04/19/17 1434  GLUCAP 131* 126* 98 158* 160*   Lipid Profile: No results for input(s): CHOL, HDL, LDLCALC, TRIG, CHOLHDL, LDLDIRECT in the last 72 hours. Thyroid Function Tests: No results for input(s): TSH, T4TOTAL, FREET4, T3FREE, THYROIDAB in the last 72 hours. Anemia Panel: No results for input(s): VITAMINB12, FOLATE, FERRITIN, TIBC, IRON, RETICCTPCT in the last 72 hours. Sepsis Labs: No results for input(s): PROCALCITON, LATICACIDVEN in the last 168 hours.  No results found for this or any previous visit (from the past 240 hour(s)).       Radiology Studies: No results  found.      Scheduled Meds: . carvedilol  6.25 mg Oral BID WC  . darbepoetin (ARANESP) injection - DIALYSIS  200 mcg Intravenous Q Thu-HD  . dextrose  1 ampule Intravenous Once  . docusate sodium  100 mg Oral BID  . DULoxetine  30 mg Oral Daily  . feeding supplement (PRO-STAT SUGAR FREE 64)  30 mL Oral TID  . [name]'s butt cream   Topical QID  . heparin  5,000 Units Subcutaneous Q8H  . insulin aspart  0-15 Units Subcutaneous TID WC  . insulin glargine  8 Units Subcutaneous Daily  . mouth rinse  15 mL Mouth Rinse BID  . multivitamin  1 tablet Oral QHS  . sodium chloride flush  3 mL Intravenous Q12H   Continuous Infusions: . sodium chloride    . sodium chloride       LOS: 64 days    Time spent:25 minutes.     Kathlen Mody, MD [facility] Pager [phone]  If 7PM-7AM, please contact night-coverage [email or web address] Password [facility] 04/19/2017, 4:12 PM

## 2017-04-20 LAB — GLUCOSE, CAPILLARY
GLUCOSE-CAPILLARY: 115 mg/dL — AB (ref 65–99)
GLUCOSE-CAPILLARY: 150 mg/dL — AB (ref 65–99)
Glucose-Capillary: 122 mg/dL — ABNORMAL HIGH (ref 65–99)
Glucose-Capillary: 157 mg/dL — ABNORMAL HIGH (ref 65–99)
Glucose-Capillary: 123 mg/dL — ABNORMAL HIGH (ref 65–99)

## 2017-04-20 MED ORDER — GERHARDT'S BUTT CREAM: 1.0000 "application " | TOPICAL_CREAM | Freq: Four times a day (QID) | CUTANEOUS | Status: DC

## 2017-04-20 MED ORDER — DARBEPOETIN ALFA 200 MCG/0.4ML IJ SOSY: 200.0000 ug | PREFILLED_SYRINGE | INTRAMUSCULAR | Status: AC

## 2017-04-20 MED ORDER — RESOURCE THICKENUP CLEAR PO POWD: ORAL | Status: AC

## 2017-04-20 MED ORDER — PRO-STAT SUGAR FREE PO LIQD: 30.0000 mL | Freq: Three times a day (TID) | ORAL | 0 refills | Status: AC

## 2017-04-20 MED ORDER — DOCUSATE SODIUM 100 MG PO CAPS: 100.0000 mg | ORAL_CAPSULE | Freq: Two times a day (BID) | ORAL | 0 refills | Status: AC

## 2017-04-20 MED ORDER — INSULIN GLARGINE 100 UNIT/ML ~~LOC~~ SOLN: 8.0000 [IU] | Freq: Every day | SUBCUTANEOUS | 11 refills | Status: AC

## 2017-04-20 MED ORDER — BISACODYL 10 MG RE SUPP: 10.0000 mg | Freq: Every day | RECTAL | 0 refills | Status: AC | PRN

## 2017-04-20 MED ORDER — CARVEDILOL 6.25 MG PO TABS: 6.2500 mg | ORAL_TABLET | Freq: Two times a day (BID) | ORAL | Status: AC

## 2017-04-20 NOTE — Discharge Summary (Addendum)
Physician Discharge Summary  [name] ZOX:[id number] DOB: May 19, 1981 DOA: 02/14/2017  PCP: [name], FNP  Admit date: 02/14/2017 Discharge date: 04/20/2017  Admitted From: HOME.  Disposition: SNF  Recommendations for Outpatient Follow-up:  1. Follow up with PCP in 1-2 weeks 2. Please obtain BMP/CBC in one week 3. Please follow up with nephrology as needed.     Discharge Condition: stable.  CODE STATUS: full code.  Diet recommendation: Renal diet.  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   Admitted on 02/14/17 to [facility] with left foot/leg pain, wound dehiscence and purulent drainage from the left trans-metatarsal amputation site.  She was subsequently found to be unresponsive on 5/19. Noted to bein asystole and CPR started with ROSC in approximately 9 minutes. She was intubated by anaesthesia, transferred to the intensive care unit and subsequently underwent cooling protocol. She couldn't be weaned off the ventilator and as a result underwent tracheostomy on 5/31. She underwent decannulation on 7/16.  Neurological/ Cognitive status has been poor. She has been evaluated by neurology on 5/29 and, per neurology, chances of meaningful neurological recovery appeared to be dismal.  Currently she is able to follow simple commands and is alert and is eating.  Hospital course complicated by persistent leukocytosis, respiratory failure and left transmetatarsal wound dehiscence with gangrene. 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  Over the course of her hospitalizations, she has been treated for multiple episodes of recurrent aspiration pneumonias.  Currently off antibiotics and awaiting placement.  No new issues overnight,  currently awaiting SNF placement.    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)   Diabetic osteomyelitis (HCC)   Dehiscence of amputation stump (HCC)   Osteomyelitis (HCC)   Chronic pain syndrome   Cardiac arrest, cause unspecified (HCC)   Pressure injury of skin   Wound dehiscence   Coma (HCC)   Elevated liver enzymes   Jaundice   Encounter for PEG (percutaneous endoscopic gastrostomy) (HCC)   Protein calorie malnutrition (HCC)   DNR (do not resuscitate) discussion   Palliative care by specialist   Tachycardia   Goals of care, counseling/discussion   Anoxic brain injury [facility])   Palliative care encounter   Tracheostomy status (HCC)   Abscess of left leg   Hypoxemia   Fever   Aspiration into airway  Acute on chronic respiratory failure, in the setting of cardiac arrest with superimposed recurrent aspiration pneumonias during her hospitalization.  S/p intubation on 5/19 and extubated 5/31, tracheostomy, and de cannulated on 7/16.  - currently on RA, and off antibiotics.  - PEG on hold and on dysphagia diet and mom at bedside, reports good appetite.  - no respiratory issues so far.  - no new complaints.    ESRD on HD  TTS per nephrology.  Nephrology on board and recommendations given.  Tolerated HD in chair well.  Off midodrine. bp on the low normal. Monitor  to see if she needs to be started on midodrine.      Left trans metatarsal stump wound dehiscence with infection and gangrene: - underwent AKA, because of wound dehiscence and osteomyelitis. She  underwent revision of the AKA on 7/6.  - dry dressings as needed.  - off the wound vac.  - no changes.  - in bed today.    Right heel eschar/ pressure ulcers on the sacral area:  Wound care consulted and recommendations given.  Dry dressings.   Type 2 DM:  CBG (last 3)   Recent Labs  04/19/17 1651 04/19/17 2113 04/20/17 1209  GLUCAP 113* 162* 157*      Resume SSI and lantus of 8 units.     Anoxic brain injury:  Slow cognitive improvement  Following some commands and talking.  No agitation.     Normocytic anemia:  Anemia of chronic disease worsened by acute illness .  Hemoglobin stable around 8.  darbopoetin once every week.    NICM, WITH chronic systolic heart failure:  She appears to be compensated.  Fluid management with HD.  Resume coreg. Hold for SBP<90MMHG.    Pericardial effusion without evidence of tamponade physiology.   Moderate protein calorie malnutrition:  Dietary consulted and recommendations given.   Depression;  Resumed home cymbalta.    Dysphagia:  On dys 2 diet with nectar thick liquids.   Hypertension:  Well controlled   Patient is medically stable for discharge and we are awaiting placement.     Consultants:   PCCM as needed  Nephrology  Palliative care consult   Orthopedics,    Procedures: STUDIES: Lower extremity ultrasound 5/18 >> no evidence of DVT CT head 5/19 >> normal exam Echo 5/20 >>EF 30-35%, increased LVF. Diffuse hypokinesis, akinesis of basilar mid-inferior myocardium, grade 2 diastolic dysfxn  EEG 5/20 >>finding c/w mod to severe global cerebral dysfxn. C/w anoxic injury given clinical course  LE [place] 5/21 >> negative MRI brain 5/24 >> ?thrombosed cortical vein, otherwise normal TEE 6/4 >>mild MR, normal AV, mild TR, mild PR, EF 30-35%, diffuse hypokinesis, no thrombus, no PFO, normal RV, moderate pericardial effusion with synechia suggesting some chronicity, no vegetations    Antimicrobials: Clinidamycin 5/19 >> 5/21 Vancomycin 5/19 >> 5/25 >> restart 5/30 Zosyn 5/19 >> 5/22 Meropenem 5/22 >> 5/30 Flagyl 5/30 >> off Ceftriaxone 5/30 >>6/4  SIGNIFICANT EVENTS: 5/19 Admit, cardiac arrest, transfer to ICU; hypothermia protocol started 5/24 Off pressors. MRI w/out evidence of ischemia  6/03 Tolerated ATC couple hours, then back to vent, HD  7/11 Trach capped  7/16 On RA. Trach dislodged over the weekend, replaced without difficulty. Decannulated late PM  LINES/TUBES: ETT 5/19 >> 5/31 Lt IJ CVL 5/19 >> out Trach 5/31 >>7/16   Discharge Instructions  Discharge Instructions    Diet - low sodium heart healthy    Complete by:  As directed    Discharge instructions    Complete by:  As directed    PLEASE FOLLOW UP WITH PCP in one week post discharge from the SNF.     Allergies as of 04/20/2017      Reactions   Aspirin Anaphylaxis   Sulfur Hives   Tramadol Hives, Other (See Comments)   Pt states she feels weird   Gabapentin Other (See Comments)   Lethargic       Medication List    STOP taking these medications   atorvastatin 40 MG tablet Commonly known as:  LIPITOR   diphenhydrAMINE 25 MG tablet  Commonly known as:  BENADRYL   hydrOXYzine 25 MG tablet Commonly known as:  ATARAX/VISTARIL   Insulin Glargine 100 UNIT/ML Solostar Pen Commonly known as:  LANTUS SOLOSTAR Replaced by:  insulin glargine 100 UNIT/ML injection   isosorbide mononitrate 20 MG tablet Commonly known as:  ISMO,MONOKET   meclizine 25 MG tablet Commonly known as:  ANTIVERT   oxyCODONE 5 MG immediate release tablet Commonly known as:  ROXICODONE   oxyCODONE-acetaminophen 5-325 MG tablet Commonly known as:  ROXICET   pregabalin 50 MG capsule Commonly known as:  LYRICA   sevelamer carbonate 800 MG tablet Commonly known as:  RENVELA   tiZANidine 4 MG tablet Commonly known as:  ZANAFLEX     TAKE these medications   albuterol 108 (90 Base) MCG/ACT  inhaler Commonly known as:  PROVENTIL HFA;VENTOLIN HFA Inhale 2 puffs into the lungs every 6 (six) hours as needed for wheezing or shortness of breath.   albuterol (2.5 MG/3ML) 0.083% nebulizer solution Commonly known as:  PROVENTIL Take 2.5 mg by nebulization every 6 (six) hours as needed for wheezing or shortness of breath.   bisacodyl 10 MG suppository Commonly known as:  DULCOLAX Place 1 suppository (10 mg total) rectally daily as needed for moderate constipation.   budesonide 0.25 MG/2ML nebulizer solution Commonly known as:  PULMICORT Take 0.25 mg by nebulization 2 (two) times daily as needed (shortness of breath or wheezing).   carvedilol 6.25 MG tablet Commonly known as:  COREG Take 1 tablet (6.25 mg total) by mouth 2 (two) times daily with a meal. What changed:  medication strength  how much to take  when to take this   collagenase ointment Commonly known as:  SANTYL Apply 1 application topically daily. What changed:  when to take this  reasons to take this   Darbepoetin Alfa 200 MCG/0.4ML Sosy injection Commonly known as:  ARANESP Inject 0.4 mLs (200 mcg total) into the vein every Thursday with hemodialysis.   docusate sodium 100 MG capsule Commonly known as:  COLACE Take 1 capsule (100 mg total) by mouth 2 (two) times daily.   DULoxetine 30 MG capsule Commonly known as:  CYMBALTA Take 1 capsule (30 mg total) by mouth daily. Qam   feeding supplement (PRO-STAT SUGAR FREE 64) Liqd Take 30 mLs by mouth 3 (three) times daily.   [name]'s butt cream Crea Apply 1 application topically 4 (four) times daily.   insulin glargine 100 UNIT/ML injection Commonly known as:  LANTUS Inject 0.08 mLs (8 Units total) into the skin daily. Replaces:  Insulin Glargine 100 UNIT/ML Solostar Pen   multivitamin Tabs tablet Take 1 tablet by mouth daily.   NOVOLOG FLEXPEN 100 UNIT/ML FlexPen Generic drug:  insulin aspart Inject 2-15 Units into the skin 3 (three) times  daily with meals. Per sliding scale - based on carb count and CBG   RESOURCE THICKENUP CLEAR Powd USE AS NEEDED.      Follow-up Information    Nadara Mustard, MD Follow up in 1 week(s).   Specialty:  Orthopedic Surgery Contact information: [address] [place] [address] [phone]          Allergies  Allergen Reactions  . Aspirin Anaphylaxis  . Sulfur Hives  . Tramadol Hives and Other (See Comments)    Pt states she feels weird   . Gabapentin Other (See Comments)    Lethargic     Consultations:  nephrology   Procedures/Studies: Ct Angio Chest Pe W Or Wo Contrast  Result  Date: 03/22/2017 CLINICAL DATA:  Tachycardia. EXAM: CT ANGIOGRAPHY CHEST WITH CONTRAST TECHNIQUE: Multidetector CT imaging of the chest was performed using the standard protocol during bolus administration of intravenous contrast. Multiplanar CT image reconstructions and MIPs were obtained to evaluate the vascular anatomy. CONTRAST:  100 cc Isovue COMPARISON:  None. FINDINGS: Cardiovascular: No filling defects within the pulmonary arteries arteries to suggest acute pulmonary embolism. Mediastinum/Nodes: No axillary or supraclavicular adenopathy. No mediastinal adenopathy. Tracheostomy tube extends the mid trachea. Feeding tube extends the stomach esophagus. Lungs/Pleura: Mild basilar atelectasis. No pleural effusion. No infiltrate or overt pulmonary edema. Upper Abdomen: Limited view of the liver, kidneys, pancreas are unremarkable. Normal adrenal glands. Musculoskeletal: No aggressive osseous lesion. Review of the MIP images confirms the above findings. IMPRESSION: 1. No pulmonary embolism. 2. Bibasilar atelectasis. 3. Tracheostomy tube appears in good position Electronically Signed   By: Genevive Bi M.D.   On: 03/22/2017 13:52   Dg Chest Port 1 View  Result Date: 04/05/2017 CLINICAL DATA:  SOB. Hx diabetes, HTN, PNA, CAD, COPD. EXAM: PORTABLE CHEST 1 VIEW COMPARISON:  03/29/2017  FINDINGS: Tracheostomy is unchanged in position. Numerous leads and wires project over the chest. Midline trachea. Cardiomegaly accentuated by AP portable technique. No pleural fluid. No pneumothorax. Moderate interstitial prominence and indistinctness. Improved right-sided aeration with patchy left mid and lower lung airspace disease developing. IMPRESSION: Cardiomegaly with similar moderate interstitial prominence, suspicious for pulmonary edema. Shifting airspace opacities which could represent infection or alveolar edema. Electronically Signed   By: Jeronimo Greaves M.D.   On: 04/05/2017 11:24   Dg Chest Port 1 View  Result Date: 03/29/2017 CLINICAL DATA:  Follow-up pneumonia EXAM: PORTABLE CHEST 1 VIEW COMPARISON:  03/27/2017 FINDINGS: Cardiomegaly with vascular congestion. No overt edema. No effusions. Improving infiltrate in the right lung. IMPRESSION: Cardiomegaly with vascular congestion. Improving airspace opacity in the right lung. Electronically Signed   By: Charlett Nose M.D.   On: 03/29/2017 11:02   Dg Chest Port 1 View  Result Date: 03/27/2017 CLINICAL DATA:  Tachypnea EXAM: PORTABLE CHEST 1 VIEW COMPARISON:  03/26/2017 FINDINGS: Tracheostomy tube is again noted in satisfactory position. Cardiomegaly is again noted. Mild increasing vascular congestion is noted with some interstitial edema. Increasing density in the right lung base is noted likely representing early infiltrate. This is new from the prior exam. No bony abnormality is noted. IMPRESSION: Stable interstitial edema with new right basilar infiltrate identified. Electronically Signed   By: Alcide Clever M.D.   On: 03/27/2017 07:14   Dg Chest Port 1 View  Result Date: 03/26/2017 CLINICAL DATA:  Aspiration EXAM: PORTABLE CHEST 1 VIEW COMPARISON:  03/21/2017 FINDINGS: Tracheostomy is unchanged. Interval removal of right Central line and feeding tube. Cardiomegaly. Increasing interstitial prominence within the lungs could reflect  interstitial edema. No effusions or acute bony abnormality. IMPRESSION: Stable cardiomegaly. Increasing interstitial prominence, question interstitial edema. Electronically Signed   By: Charlett Nose M.D.   On: 03/26/2017 09:43   Dg Chest Port 1 View  Result Date: 03/21/2017 CLINICAL DATA:  Fever. EXAM: PORTABLE CHEST 1 VIEW COMPARISON:  March 17, 2017 FINDINGS: A tracheostomy tube is stable. A feeding tube terminates below today's film. A right-sided line terminates in the right atrium, unchanged. Stable cardiomegaly. No pneumothorax. No pulmonary nodules, masses, or focal infiltrates. IMPRESSION: Stable support apparatus. Cardiomegaly without overt edema. No change. Electronically Signed   By: Gerome Sam III M.D   On: 03/21/2017 14:56   Dg Abd Portable 1v  Result Date: 03/26/2017 CLINICAL  DATA:  Feeding tube placement EXAM: PORTABLE ABDOMEN - 1 VIEW COMPARISON:  None. FINDINGS: No feeding tube visualized on this image. Nonobstructive bowel gas pattern. IMPRESSION: No visible feeding tube. Electronically Signed   By: Charlett Nose M.D.   On: 03/26/2017 09:43   Dg Swallowing Func-speech Pathology  Result Date: 04/17/2017 Objective Swallowing Evaluation: Type of Study: MBS-Modified Barium Swallow Study Patient Details Name: Mirel Hundal MRN: [id number] Date of Birth: Mar 09, 1981 Today's Date: 04/17/2017 Time: SLP Start Time (ACUTE ONLY): 1100-SLP Stop Time (ACUTE ONLY): 1130 SLP Time Calculation (min) (ACUTE ONLY): 30 min Past Medical History: Past Medical History: Diagnosis Date . Anemia  . Arthritis   "left hand" (09/15/2013) . Asthma  . CHF (congestive heart failure) (HCC)  . Chronic bronchitis (HCC)   "just about q yr" (09/15/2013) . Chronic kidney disease   "low kidney function" (09/15/2013) ,  T/Th/Sa . COPD (chronic obstructive pulmonary disease) (HCC)  . Coronary artery disease  . Hyperlipidemia  . Hypertension  . Migraine   "get them alot" (09/15/2013) . Myocardial infarction [facility]) 04/2015  NSTEMI .  Normal coronary arteries   by cardiac catheterization 09/20/13 . Peripheral vascular disease (HCC)  . Pneumonia   "couple times; have it now" (09/15/2013) . PONV (postoperative nausea and vomiting)  . Restless legs  . Shortness of breath   "just recently; related to the pneumonia" (09/15/2013) . Type 1 diabetes mellitus (HCC)   type 2 Past Surgical History: Past Surgical History: Procedure Laterality Date . AMPUTATION Right 06/25/2016  Procedure: Amputation Right Great Toe at the Metatarsophalangeal Joint;  Surgeon: Nadara Mustard, MD;  Location: [facility] OR;  Service: Orthopedics;  Laterality: Right; . AMPUTATION Bilateral 10/08/2016  Procedure: Bilateral Transmetatarsal Amputation;  Surgeon: Nadara Mustard, MD;  Location: [facility] OR;  Service: Orthopedics;  Laterality: Bilateral; . AMPUTATION Left 03/13/2017  Procedure: AMPUTATION ABOVE KNEE;  Surgeon: Nadara Mustard, MD;  Location: [facility] OR;  Service: Orthopedics;  Laterality: Left; . APPLICATION OF WOUND VAC Left 04/03/2017  Procedure: Application of Wound Vacuum;  Surgeon: Nadara Mustard, MD;  Location: [facility] OR;  Service: Orthopedics;  Laterality: Left; . AV FISTULA PLACEMENT Left 03/27/2015  Procedure: CREATION RADIAL CEPHALIC ARTERIOVENOUS FISTULA;  Surgeon: Chuck Hint, MD;  Location: [facility] OR;  Service: Vascular;  Laterality: Left; . AV FISTULA PLACEMENT Left 11/23/2015  Procedure:  LEFT ARM BASILIC VEIN TRANSPOSITION  ;  Surgeon: Chuck Hint, MD;  Location: [facility] OR;  Service: Vascular;  Laterality: Left; . CESAREAN SECTION  1999; 2006 . CORONARY ANGIOGRAM  09/20/2013  Procedure: CORONARY ANGIOGRAM;  Surgeon: Runell Gess, MD;  Location: [facility] CATH LAB;  Service: Cardiovascular;; . FINGER SURGERY Left 1985  3rd and 4th digits reconstructed after cut off" (09/15/2013) . IR FLUORO GUIDE CV LINE RIGHT  03/11/2017 . IR US GUIDE VASC ACCESS RIGHT  03/11/2017 . PERIPHERAL VASCULAR CATHETERIZATION N/A 09/05/2016  Procedure: Abdominal Aortogram;  Surgeon: Sherren Kerns, MD;   Location: [facility] INVASIVE CV LAB;  Service: Cardiovascular;  Laterality: N/A; . PERIPHERAL VASCULAR CATHETERIZATION Bilateral 09/05/2016  Procedure: Lower Extremity Angiography;  Surgeon: Sherren Kerns, MD;  Location: [facility] INVASIVE CV LAB;  Service: Cardiovascular;  Laterality: Bilateral; . PERIPHERAL VASCULAR CATHETERIZATION Right 09/05/2016  Procedure: Peripheral Vascular Balloon Angioplasty;  Surgeon: Sherren Kerns, MD;  Location: [facility] INVASIVE CV LAB;  Service: Cardiovascular;  Laterality: Right;  peroneal and AT . SHOULDER ARTHROSCOPY WITH BICEPSTENOTOMY Right 05/10/2015  Procedure: RIGHT SHOULDER ARTHROSCOPY WITH BICEPS TENOTOMY, DEBRIDEMENT LABRAL TEAR;  Surgeon: Jill Alexanders  Ave Filter, MD;  Location: [facility] OR;  Service: Orthopedics;  Laterality: Right;  Right shoulder arthroscopy biceps tenotomy, debridement labral tear . STUMP REVISION Left 01/21/2017  Procedure: . Revision Left Transmetatarsal Amputation;  Surgeon: Nadara Mustard, MD;  Location: [facility] OR;  Service: Orthopedics;  Laterality: Left; . STUMP REVISION Left 04/03/2017  Procedure: Revision Left Above Knee Amputation;  Surgeon: Nadara Mustard, MD;  Location: [facility] OR;  Service: Orthopedics;  Laterality: Left; . TONSILLECTOMY  1997 . TUBAL LIGATION  2006 HPI: Ayannah Faddis a 36 y.o.femalewith a history of ESRD on HD T TH S, COPD/ Asthma , D s/p MI 04/2015, HLD, HTN, CHF, chronic anemia, DM, and a history of L foot partial amputation 09/2016 with revision in 12/2016 for dehiscence, presenting to the ED with worsening LLE pain, swelling, increased drainage at the stump, and chills. ETT 5/19 and trach'd 5/31. Hospital course included cardiac arrest, transfer to ICU No Data Recorded Assessment / Plan / Recommendation CHL IP CLINICAL IMPRESSIONS 04/17/2017 Clinical Impression   Pt demonstrates improved swallow function, particularly with mastication of solids. Pt does still have silent trace penetration events during the swallow as swallow initiation is timely, but hyoid and  excursion and complete airway closure is slightly slow intermittently. Despite this, penetration is minimal and is mostly ejected with subsequent swallows. Recommend intermittent throat clears with thin liquids and regular solids. SLP will follow for tolerance.  SLP Visit Diagnosis Dysphagia, oropharyngeal phase (R13.12) Attention and concentration deficit following -- Frontal lobe and executive function deficit following -- Impact on safety and function --   CHL IP TREATMENT RECOMMENDATION 04/17/2017 Treatment Recommendations Therapy as outlined in treatment plan below   Prognosis 03/25/2017 Prognosis for Safe Diet Advancement Good Barriers to Reach Goals Cognitive deficits Barriers/Prognosis Comment -- CHL IP DIET RECOMMENDATION 04/17/2017 SLP Diet Recommendations Regular solids;Thin liquid Liquid Administration via Cup;Straw Medication Administration Whole meds with puree Compensations Slow rate;Small sips/bites;Clear throat intermittently Postural Changes Remain semi-upright after after feeds/meals (Comment)   CHL IP OTHER RECOMMENDATIONS 04/17/2017 Recommended Consults -- Oral Care Recommendations Oral care BID Other Recommendations --   CHL IP FOLLOW UP RECOMMENDATIONS 04/17/2017 Follow up Recommendations Inpatient Rehab   CHL IP FREQUENCY AND DURATION 04/17/2017 Speech Therapy Frequency (ACUTE ONLY) min 2x/week Treatment Duration 2 weeks      CHL IP ORAL PHASE 04/17/2017 Oral Phase WFL Oral - Pudding Teaspoon -- Oral - Pudding Cup -- Oral - Honey Teaspoon -- Oral - Honey Cup -- Oral - Nectar Teaspoon -- Oral - Nectar Cup -- Oral - Nectar Straw -- Oral - Thin Teaspoon -- Oral - Thin Cup -- Oral - Thin Straw -- Oral - Puree -- Oral - Mech Soft -- Oral - Regular -- Oral - Multi-Consistency -- Oral - Pill -- Oral Phase - Comment --  CHL IP PHARYNGEAL PHASE 04/17/2017 Pharyngeal Phase Impaired Pharyngeal- Pudding Teaspoon -- Pharyngeal -- Pharyngeal- Pudding Cup -- Pharyngeal -- Pharyngeal- Honey Teaspoon -- Pharyngeal --  Pharyngeal- Honey Cup -- Pharyngeal -- Pharyngeal- Nectar Teaspoon NT Pharyngeal -- Pharyngeal- Nectar Cup NT Pharyngeal -- Pharyngeal- Nectar Straw -- Pharyngeal -- Pharyngeal- Thin Teaspoon NT Pharyngeal -- Pharyngeal- Thin Cup Penetration/Aspiration during swallow Pharyngeal Material enters airway, CONTACTS cords and then ejected out;Material enters airway, remains ABOVE vocal cords and not ejected out;Material does not enter airway Pharyngeal- Thin Straw Penetration/Aspiration during swallow Pharyngeal Material does not enter airway;Material enters airway, remains ABOVE vocal cords and not ejected out;Material enters airway, CONTACTS cords and then ejected out Pharyngeal- Puree [facility]  Pharyngeal -- Pharyngeal- Mechanical Soft WFL Pharyngeal -- Pharyngeal- Regular WFL Pharyngeal -- Pharyngeal- Multi-consistency -- Pharyngeal -- Pharyngeal- Pill WFL Pharyngeal -- Pharyngeal Comment --  No flowsheet data found. No flowsheet data found. DeBlois, [name] 04/17/2017, 2:02 PM                  Subjective: No new complaints.   Discharge Exam: Vitals:   04/20/17 0553 04/20/17 0702  BP: (!) 144/101   Pulse: 99 (!) 101  Resp: 18   Temp: 97.8 F (36.6 C) 97.9 F (36.6 C)   Vitals:   04/20/17 0400 04/20/17 0500 04/20/17 0553 04/20/17 0702  BP: (!) 151/107 (!) 166/109 (!) 144/101   Pulse:   99 (!) 101  Resp:   18   Temp:   97.8 F (36.6 C) 97.9 F (36.6 C)  TempSrc:   Oral Oral  SpO2:   97%   Weight:  81 kg (178 lb 9.2 oz) 81 kg (178 lb 9.2 oz)   Height:        General: Pt is alert, awake, not in acute distress Cardiovascular: RRR, S1/S2 +, no rubs, no gallops Respiratory: CTA bilaterally, no wheezing, no rhonchi Abdominal: Soft, NT, ND, bowel sounds + Extremities: no edema, no cyanosis    The results of significant diagnostics from this hospitalization (including imaging, microbiology, ancillary and laboratory) are listed below for reference.     Microbiology: No results found  for this or any previous visit (from the past 240 hour(s)).   Labs: BNP (last 3 results) No results for input(s): BNP in the last 8760 hours. Basic Metabolic Panel:  Recent Labs Lab 04/13/17 1426 04/14/17 0730 04/16/17 1408 04/18/17 1729  NA 139 141 133* 131*  K 4.5 4.4 4.8 4.1  CL 99* 100* 94* 92*  CO2 27 29 24 27   GLUCOSE 110* 116* 156* 102*  BUN 36* 47* 51* 40*  CREATININE 4.27* 5.00* 5.00* 4.64*  CALCIUM 9.4 9.5 9.1 9.0  PHOS 4.6 4.8* 4.8* 4.9*   Liver Function Tests:  Recent Labs Lab 04/13/17 1426 04/14/17 0730 04/16/17 1408 04/18/17 1729  ALBUMIN 2.5* 2.4* 2.3* 2.3*   No results for input(s): LIPASE, AMYLASE in the last 168 hours. No results for input(s): AMMONIA in the last 168 hours. CBC:  Recent Labs Lab 04/13/17 1426 04/14/17 0730 04/16/17 1407 04/18/17 1730  WBC 9.7 9.2 9.2 8.3  HGB 9.1* 8.9* 8.6* 8.9*  HCT 32.5* 31.0* 29.6* 30.3*  MCV 102.2* 100.0 98.0 98.1  PLT 434* 483* 399 374   Cardiac Enzymes: No results for input(s): CKTOTAL, CKMB, CKMBINDEX, TROPONINI in the last 168 hours. BNP: Invalid input(s): POCBNP CBG:  Recent Labs Lab 04/19/17 0734 04/19/17 1231 04/19/17 1434 04/19/17 1651 04/19/17 2113  GLUCAP 98 158* 160* 113* 162*   D-Dimer No results for input(s): DDIMER in the last 72 hours. Hgb A1c No results for input(s): HGBA1C in the last 72 hours. Lipid Profile No results for input(s): CHOL, HDL, LDLCALC, TRIG, CHOLHDL, LDLDIRECT in the last 72 hours. Thyroid function studies No results for input(s): TSH, T4TOTAL, T3FREE, THYROIDAB in the last 72 hours.  Invalid input(s): FREET3 Anemia work up No results for input(s): VITAMINB12, FOLATE, FERRITIN, TIBC, IRON, RETICCTPCT in the last 72 hours. Urinalysis    Component Value Date/Time   COLORURINE AMBER (A) 02/14/2017 2111   APPEARANCEUR HAZY (A) 02/14/2017 2111   LABSPEC 1.015 02/14/2017 2111   PHURINE 5.0 02/14/2017 2111   GLUCOSEU NEGATIVE 02/14/2017 2111   HGBUR  SMALL (A) 02/14/2017 2111   BILIRUBINUR NEGATIVE 02/14/2017 2111   KETONESUR NEGATIVE 02/14/2017 2111   PROTEINUR 30 (A) 02/14/2017 2111   UROBILINOGEN 0.2 09/21/2014 1908   NITRITE NEGATIVE 02/14/2017 2111   LEUKOCYTESUR TRACE (A) 02/14/2017 2111   Sepsis Labs Invalid input(s): PROCALCITONIN,  WBC,  LACTICIDVEN Microbiology No results found for this or any previous visit (from the past 240 hour(s)).   Time coordinating discharge: Over 30 minutes  SIGNED:   Kathlen Mody, MD  [facility] 04/20/2017, 10:58 AM Pager   If 7PM-7AM, please contact night-coverage [email or web address] Password [email or web address]   Addendum:  Patient was not discharged on 7/23 as she could not sit in a normal wheel chair to be transported due to poor truncal stablity and the facility declined to take her.

## 2017-04-20 NOTE — Progress Notes (Addendum)
4:20pm CSW spoke with supervisor and Market researcher concerning pt situation.  Will need intensive PT in the hospital to allow pt to sit in a normal wheelchair for dialysis transport.  CSW paged MD to request daily PT order- cannot DC until able to be transported to dialysis center in wheelchair van  3:15pm CSW following for DC to SNF  Spoke with pt mom this morning at 9am to confirm preference for [facility] states this is top choice but wanted to speak with someone at the facility  CSW arranged for facility coordinator to visit at 12pm to discuss  Facility coordinator met with family- they can take patient as long as patient can sit in a normal wheelchair to be transported to dialysis  CSW spoke with PT and informed of SNF requirements- they worked with patient and do not think normal wheelchair transport is safe for pt- states pt was sliding out of chair as soon as she was placed in it  CSW discussing alternative options with supervisor  Jorge Ny, [place] Social Worker [phone]

## 2017-04-20 NOTE — Progress Notes (Signed)
Occupational Therapy Treatment Patient Details Name: [name] MRN: [id number] DOB: 06-Sep-1981 Today's Date: 04/20/2017    History of present illness [name] is a 36 y.o. 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 ETT 5/19 and trach'd 5/31. Trach removed 7/16.   OT comments  Pt seen in conjunction with PT to assess her ability to maintain sitting balance in standard w/c.  Pt was incontinent of stool, and assisted with peri care.  Maxi sky was utilized to transfer pt to w/c.  Once pt was lowered into chair, she immediately slid to EOC making no attempt to correct posture.  Pt was unsafe to leave in w/c, and was transferred back to bed.  Pt demonstrates poor trunk control which is compounded by pt's cognitive deficits.  She demonstrates poor attention, and poor safety awareness.  If pt to be transported via w/c, optimally recommend tilt in space w/c with lap and shoulder straps, if this is not an option, recommend reclining w/c with lap belt and shoulder harness to prevent pt from sliding/falling out of w/c.    Follow Up Recommendations  SNF;Supervision/Assistance - 24 hour    Equipment Recommendations  Wheelchair (measurements OT);Wheelchair cushion (measurements OT);Hospital bed;3 in 1 bedside commode    Recommendations for Other Services      Precautions / Restrictions Precautions Precautions: Fall Precaution Comments: trach Restrictions Weight Bearing Restrictions: No RLE Weight Bearing: Non weight bearing LLE Weight Bearing: Non weight bearing Other Position/Activity Restrictions: PRAFO boot on RLE       Mobility Bed Mobility Overal bed mobility: Needs Assistance Bed  Mobility: Rolling Rolling: Min assist;Min guard (Min guard to roll right; min A to roll left)         General bed mobility comments: Rolling to right/left x6 for pericare and for donning/doffing lift pad.   Transfers Overall transfer level: Needs assistance               General transfer comment: Transferred to w/c and back as pt sliding out of w/c in posterior pelvic tilt prior to being lowered all the way down. BP 80s/50s.    Balance Overall balance assessment: No apparent balance deficits (not formally assessed) Sitting-balance support: Feet supported Sitting balance-Leahy Scale: Poor Sitting balance - Comments: Pt unable to maintain sitting in w/c.  Immediately slides forward without attempts to correct posture  Postural control: Posterior lean                                 ADL either performed or assessed with clinical judgement   ADL Overall ADL's : Needs assistance/impaired Eating/Feeding: Set up;Minimal assistance;Bed level Eating/Feeding Details (indicate cue type and reason): Pt requires excessive amount of time to self feed to being easily distracted                                          Vision       Perception     Praxis      Cognition Arousal/Alertness: Awake/alert Behavior During  Therapy: Flat affect Overall Cognitive Status: Impaired/Different from baseline Area of Impairment: Attention;Following commands;Safety/judgement;Awareness;Problem solving                 Orientation Level: Disoriented to;Time;Situation Current Attention Level: Selective;Sustained Memory: Decreased recall of precautions;Decreased short-term memory Following Commands: Follows one step commands with increased time Safety/Judgement: Decreased awareness of deficits;Decreased awareness of safety   Problem Solving: Slow processing;Requires tactile cues;Requires verbal cues General Comments: Pt HOH, requires increased time to follow  commands.        Exercises Amputee Exercises Quad Sets: Left;5 reps;Supine Hip ABduction/ADduction: Left;5 reps;Supine Hip Flexion/Marching: Left;5 reps;Supine   Shoulder Instructions       General Comments Mother present during session.    Pertinent Vitals/ Pain       Pain Assessment: No/denies pain  Home Living                                          Prior Functioning/Environment              Frequency  Min 2X/week        Progress Toward Goals  OT Goals(current goals can now be found in the care plan section)  Progress towards OT goals: Progressing toward goals (goals updated )  Acute Rehab OT Goals Patient Stated Goal: Pt didn't state.  She needs to be able to sit upright in standard w/c for one hour for transport to HD  OT Goal Formulation: With patient Time For Goal Achievement: 05/04/17 Potential to Achieve Goals: Fair ADL Goals Pt Will Perform Eating: with set-up;sitting Pt Will Perform Grooming: with min assist;sitting Pt Will Perform Upper Body Bathing: with min assist;sitting Pt Will Perform Lower Body Bathing: with max assist;bed level Additional ADL Goal #1: Pt will maintain EOB sitting x 15 mins with min A   Plan Discharge plan remains appropriate    Co-evaluation    PT/OT/SLP Co-Evaluation/Treatment: Yes Reason for Co-Treatment: Complexity of the patient's impairments (multi-system involvement);For patient/therapist safety;Necessary to address cognition/behavior during functional activity PT goals addressed during session: Mobility/safety with mobility;Balance OT goals addressed during session: ADL's and self-care      AM-PAC PT "6 Clicks" Daily Activity     Outcome Measure   Help from another person eating meals?: A Little Help from another person taking care of personal grooming?: A Lot Help from another person toileting, which includes using toliet, bedpan, or urinal?: Total Help from another person bathing  (including washing, rinsing, drying)?: Total Help from another person to put on and taking off regular upper body clothing?: Total Help from another person to put on and taking off regular lower body clothing?: Total 6 Click Score: 9    End of Session    OT Visit Diagnosis: Other abnormalities of gait and mobility (R26.89);Muscle weakness (generalized) (M62.81);Feeding difficulties (R63.3);Other symptoms and signs involving cognitive function;Cognitive communication deficit (R41.841)   Activity Tolerance Patient tolerated treatment well   Patient Left in bed;with call bell/phone within reach;with family/visitor present   Nurse Communication Mobility status        Time: 1435-1510 OT Time Calculation (min): 35 min  Charges: OT General Charges $OT Visit: 1 Procedure OT Treatments $Therapeutic Activity: 8-22 mins  [employer], OTR/L [phone]    Jeani Hawking M 04/20/2017, 6:29 PM

## 2017-04-20 NOTE — Care Management Note (Addendum)
Case Management Note  Patient Details  Name: [name] MRN: [id number] Date of Birth: 01-22-1981  Subjective/Objective:     Pt admitted- 5/19 with complaints of left leg pain, found to have evidence of osteomyelitis with wound nonhealing, dehiscence, purulent drainage from the wound. Admitted for IV antibiotics, hemodialysis and possible surgery. Found unresponsive in the room on 5/19- cardiac arrest- intubated Patient currently on ventilator via trachwith significantly lower mental status since code. Patient unable to care for herself and will require long term care at discharge. Patient from home and independent pta. Main support since hospitalized has been disabled mother who has stayed at bedside in ICU. Mother in [facility] with limited resources to care for self. Per nursing staff has been showering at hospital and washing her undergarments in sink, and unable to secure food for herself while here  6/7 1429 [name] RN, BSN - patient transferred to SDU, Patient with [employer], lives with mother, who has been at bedside. She has a number 6 shiley trach 30%, palliative consulted, wbc elevated, plan for peg,she has cortrak right now.  MD recommendation is fro  Amputation.  CSW following for SNF placement.       6/11 1531 [name] RN, BSN -  Patient was on trach collar, now back on vent, with non healing osteo/gangreene left leg wound s/p cardiac arrest, which patient does not want an amputation per ortho, conts on iv abx, receiving dialysis, tube feeds per cortrak, IR holding off on peg secondary to fevers and leuokocytosis.  CSW  Looking for placement   For Vent/Trach? Dialysis.    6/12 1510 [name] RN, BSN- NCM and CSW spoke with mother Myrene Buddy at beside and spoke with Wandra Mannan, the other daughter on the phone  We discussed that patient was living with boyfriend  Before she was admitted her at hospital and she was indep.  She had decided previously before she was unable  to make decisions that she did not want an amputation.  Myrene Buddy , her mother states she is the spokes person for patient now.  We informed them that we know palliative has been speaking with them and wanted to let them know there options, she is on the vent, and the available snf for her needs right now is [facility]  in [place] and if they do decide to go with hospice that is located here in [place].  The sister , Wandra Mannan states the MD stated that the amputation is not an option now.  Myrene Buddy and Wandra Mannan will think about the options that the NCM and CSW told them about so they can make an appropriate  decision for their loved one.  NCM and CSW will check back with mother.   6/14 1246 [name] RN, BSN -Move forward with amputation of left leg and subsequent placement of PEG. Per palliative note for 6/14, and they still plan a meeting with family on Friday.   6/20 1209 [name] RN, BSN - s/p L AKA on 6/15, has not been able to wean off vent, back on vent today, also awaiting WBC to trend downward in order to do peg tube.   7/9 1827 [name] RN, BSN-  6/20 last night on vent, on trach collar for several days, changed to #4 cuffless,s/p fees 6/27, on dysph 1 diet,  AKA was done on 6/15, wound vac removed 6/20, revision of AKA on 7/5 with wound vac in place. CSW following for placement for HD, trach.   7/10  [name] RN, BSN- discussed in LOS, appropriate for continued stay.   7/12 [name] RN, BSN- Trach Dependent resp failure, ESRD, anoxic brain injury secondary to cardiac arrest, l aka, chf, using passy-muir valve during the day.  Trach capped on Wed for trial, conts on iv abx, trach 28%, plan is SNF.  CSW following for placement. Discussed in LOS , appropriate for continued stay.   7/16 1729 [name] RN, BSN - decannulated today.  7/17 1544 [name] RN,BSN- decannulated yesterday, sit up in chair today for HD, she will need to be clipped for Diaylsis.  CSW  faxed out to SNF.   Discussed in LOS appropriate for continued stay.  7/23 1428 [name] RN, BSN- patient is for discharge to SNF today, CSW facilitating.  Per CSW note ,patient can not dc to SNF until she can be transported to HD by a wheelchair, right now pt does not think a normal wheelchair transport is safe for patient, ( she was sliding out of chair as soon as she was placed in it.   PCP [name]                   Action/Plan:   Expected Discharge Date:  04/20/17               Expected Discharge Plan:  Skilled Nursing Facility  In-House Referral:  Clinical Social Work  Discharge planning Services  CM Consult  Post Acute Care Choice:    Choice offered to:     DME Arranged:    DME Agency:     HH Arranged:    HH Agency:     Status of Service:  Completed, signed off  If discussed at [employer], dates discussed:    Additional Comments:  Leone Haven, RN 04/20/2017, 2:28 PM

## 2017-04-20 NOTE — Progress Notes (Signed)
Physical Therapy Treatment Patient Details Name: [name] MRN: [id number] DOB: 25-Aug-1981 Today's Date: 04/20/2017    History of Present Illness [name] is a 36 y.o. 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 ETT 5/19 and trach'd 5/31. Trach removed 7/16.    PT Comments    Goal of today's session was to see if pt could tolerate sitting in standard w/c (to ensure safe transportation to dialysis) prior to d/c to SNF per request of the facility. Attempted to transfer pt to w/c using maxisky however when being lowered into chair, pt sliding out immediately. Pt with posterior pelvic tilt and not safely able to sit in standard w/c at this time due to lack of support and poor trunk control. SW notified. Tolerated exercises at bed level. Will continue to follow.   Follow Up Recommendations  SNF;Supervision/Assistance - 24 hour     Equipment Recommendations  Other (comment) (defer)    Recommendations for Other Services       Precautions / Restrictions Precautions Precautions: Fall Restrictions Weight Bearing Restrictions: No Other Position/Activity Restrictions: PRAFO boot on RLE    Mobility  Bed Mobility Overal bed mobility: Needs Assistance Bed Mobility: Rolling Rolling: Min assist;Min guard (Min guard to roll right; min A to roll left)         General bed mobility comments: Rolling to right/left x6 for pericare and for donning/doffing lift pad.   Transfers Overall transfer level: Needs assistance               General transfer comment: Transferred to w/c and back as pt sliding out of w/c in posterior pelvic tilt prior to being lowered all the way down. BP  80s/50s.  Ambulation/Gait                 Stairs            Wheelchair Mobility    Modified Rankin (Stroke Patients Only)       Balance Overall balance assessment: No apparent balance deficits (not formally assessed)                                          Cognition Arousal/Alertness: Awake/alert Behavior During Therapy: Flat affect Overall Cognitive Status: Impaired/Different from baseline                     Current Attention Level: Selective   Following Commands: Follows one step commands with increased time Safety/Judgement: Decreased awareness of deficits;Decreased awareness of safety   Problem Solving: Slow processing;Requires tactile cues;Requires verbal cues General Comments: Pt HOH, requires increased time to follow commands.      Exercises Amputee Exercises Quad Sets: Left;5 reps;Supine Hip ABduction/ADduction: Left;5 reps;Supine Hip Flexion/Marching: Left;5 reps;Supine    General Comments General comments (skin integrity, edema, etc.): Mother present during session.      Pertinent Vitals/Pain Pain Assessment: No/denies pain    Home Living                      Prior Function  PT Goals (current goals can now be found in the care plan section) Progress towards PT goals: Progressing toward goals (slowly)    Frequency    Min 2X/week      PT Plan Discharge plan needs to be updated    Co-evaluation PT/OT/SLP Co-Evaluation/Treatment: Yes Reason for Co-Treatment: Complexity of the patient's impairments (multi-system involvement);For patient/therapist safety;To address functional/ADL transfers PT goals addressed during session: Mobility/safety with mobility;Balance        AM-PAC PT "6 Clicks" Daily Activity  Outcome Measure  Difficulty turning over in bed (including adjusting bedclothes, sheets and blankets)?: Total Difficulty moving from lying on back to sitting on the side of the  bed? : Total Difficulty sitting down on and standing up from a chair with arms (e.g., wheelchair, bedside commode, etc,.)?: Total Help needed moving to and from a bed to chair (including a wheelchair)?: Total Help needed walking in hospital room?: Total Help needed climbing 3-5 steps with a railing? : Total 6 Click Score: 6    End of Session   Activity Tolerance: Patient tolerated treatment well Patient left: in bed;with call bell/phone within reach;with family/visitor present;Other (comment) (prevalon boot donned) Nurse Communication: Mobility status;Need for lift equipment PT Visit Diagnosis: Muscle weakness (generalized) (M62.81);Other abnormalities of gait and mobility (R26.89)     Time: 1561-5379 PT Time Calculation (min) (ACUTE ONLY): 34 min  Charges:  $Therapeutic Activity: 8-22 mins                    G Codes:       Mylo Red, PT, DPT [phone]     Blake Divine A Herron Fero 04/20/2017, 3:42 PM

## 2017-04-20 NOTE — Plan of Care (Signed)
Problem: Health Behavior/Discharge Planning: Goal: Ability to manage health-related needs will improve Outcome: Progressing Patient's mom working to select a SNF for the patient at discharge. Patient's mother has toured multiple facilities and mentions that so far [facility] has been her choice. Information shared with day shift RN to pass on to social work.   Problem: Skin Integrity: Goal: Risk for impaired skin integrity will decrease Outcome: Progressing Wounds to buttocks improving and wounds dressed daily to legs. Stump healing very well- staples intact without signs of dehiscence.   Problem: Tissue Perfusion: Goal: Risk factors for ineffective tissue perfusion will decrease Outcome: Completed/Met Date Met: 04/20/17 Patient remains of heparin injections to prevent blood clots.  Problem: Activity: Goal: Risk for activity intolerance will decrease Outcome: Progressing Patient up to chair for HD. Tolerating well.

## 2017-04-20 NOTE — Progress Notes (Signed)
[facility] Rounding Note  Subjective:  No new issues.    Vital signs in last 24 hours: Vitals:   04/20/17 0400 04/20/17 0500 04/20/17 0553 04/20/17 0702  BP: (!) 151/107 (!) 166/109 (!) 144/101   Pulse:   99 (!) 101  Resp:   18   Temp:   97.8 F (36.6 C) 97.9 F (36.6 C)  TempSrc:   Oral Oral  SpO2:   97%   Weight:  81 kg (178 lb 9.2 oz) 81 kg (178 lb 9.2 oz)   Height:       Weight change: -1 kg (-2 lb 3.3 oz)  Intake/Output Summary (Last 24 hours) at 04/20/17 0915 Last data filed at 04/19/17 2235  Gross per 24 hour  Intake              723 ml  Output                0 ml  Net              723 ml   Physical Exam: GEN Alert, NAD HEENT: prior trach site healing PULM: normal WOB, clear bilaterally CV tachycardic  ABD obese, soft and non tender EXT: Left AKA dressed, no edema, Right foot in protective boot Dialysis Access: Left AVF + bruit and thrill  Dialysis: TTS [facility] 4h   F200  114kg   2/2 bath  LUA AVF  Hep none - Iron Sucrose (Venofer) 50 mg IVP During Dialysis 1X Week  - Mircera 75 mcg IV q  2 weeks ( last on 02/05/17) - Vitamin D (Calcitriol) Oral 0.75 mcg  Po q hd   Summary: 36 year old female w/ ESRD d/t poorly controlled DM, HD bilateral TMA's, obesity, HTN, medical non-compliance, NICM. Admitted 5/19 w/left leg pain, + radiological evidence of osteomyelitis w/wound nonhealing, dehiscence, purulent drainage. Admitted for IV antibiotics, hemodialysis and possible surgery. Found unresponsive in her room that afternoon - cardiac arrest > rec'd  CPR, epi bicarb; intubated; cooled. Then developed anoxic brain injury, trach and s/p L AKA this hosp. Course further complicated by recurrent aspiration PNA, dehiscence of AK wound requiring revision 7/6.  Now decannulated, ABI improving, and pt stable and ready for discharge pending choosing a SNF.   Assessment/ Plan: 1. Anoxic brain injury: s/p cardiac arrest.  Much improved. 2. Acute hypoxic respiratory failure: required  trach, now decannulated 7/16. 3. ESRD: TTS HD.  Continue normal schedule.  No heparin 4. HTN/ volume: On low-dose coreg.  Required midodrine before, now d/c'd.  EDW appears to be ~ 80 kg. 5. Anemia: Darbopoetin 200 q week.  Repeat iron panel with Tsat 36% 7/19, no need for IV iron 6. Bone/ Mineral:  Renal diet, no binder, calcitriol d/c'd due to increasing Ca.  PTH 88 here.  If still here next week, need to repeat PTH. 7. 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  S/p woundvac, now being dressed. 8. DM 2:  per primary 9. Chronic systolic CHF: EF 62-13%; improving BP, on BB 10. Nutrition: albumin 2.2.  Taking prostat. 11. Dispo: to SNF once her mom makes a decision on where to go.   Vinson Moselle MD [employer] pgr [phone]   04/20/2017, 9:16 AM         Recent Labs Lab 04/14/17 0730 04/16/17 1408 04/18/17 1729  NA 141 133* 131*  K 4.4 4.8 4.1  CL 100* 94* 92*  CO2 29 24 27   GLUCOSE 116* 156*  102*  BUN 47* 51* 40*  CREATININE 5.00* 5.00* 4.64*  CALCIUM 9.5 9.1 9.0  PHOS 4.8* 4.8* 4.9*    Recent Labs Lab 04/14/17 0730 04/16/17 1408 04/18/17 1729  ALBUMIN 2.4* 2.3* 2.3*    Recent Labs Lab 04/13/17 1426 04/14/17 0730 04/16/17 1407 04/18/17 1730  WBC 9.7 9.2 9.2 8.3  HGB 9.1* 8.9* 8.6* 8.9*  HCT 32.5* 31.0* 29.6* 30.3*  MCV 102.2* 100.0 98.0 98.1  PLT 434* 483* 399 374     Recent Labs Lab 04/19/17 0734 04/19/17 1231 04/19/17 1434 04/19/17 1651 04/19/17 2113  GLUCAP 98 158* 160* 113* 162*    Medications: Infusions: . sodium chloride    . sodium chloride      Scheduled Medications: . carvedilol  6.25 mg Oral BID WC  . darbepoetin (ARANESP) injection - DIALYSIS  200 mcg Intravenous Q Thu-HD  . dextrose  1 ampule Intravenous Once  . docusate sodium  100 mg Oral BID  . DULoxetine  30 mg Oral Daily  . feeding supplement (PRO-STAT SUGAR FREE 64)  30 mL Oral TID  . [name]'s butt cream   Topical QID  . heparin  5,000  Units Subcutaneous Q8H  . insulin aspart  0-15 Units Subcutaneous TID WC  . insulin glargine  8 Units Subcutaneous Daily  . mouth rinse  15 mL Mouth Rinse BID  . multivitamin  1 tablet Oral QHS  . sodium chloride flush  3 mL Intravenous Q12H

## 2017-04-20 NOTE — Progress Notes (Signed)
Nutrition Follow-up  DOCUMENTATION CODES:   Obesity unspecified  INTERVENTION:   Continue:  Pro-stat 30 ml TID, each supplement provides 100 kcal and 15 gm protein  Rena-vit daily  NUTRITION DIAGNOSIS:   Increased nutrient needs related to wound healing as evidenced by estimated needs.  Ongoing  GOAL:   Patient will meet greater than or equal to 90% of their needs  Met  MONITOR:   PO intake, Supplement acceptance, Diet advancement, Labs, Weight trends, Skin, I & O's  ASSESSMENT:   36 yo Female w/ ESRD d/t poorly controlled DM, bilateral transmetatarsal foot amputations, obesity, HTN, medical non-compliance as well as NICM. Admitted with complaints of left leg pain, found to have radiological evidence of osteomyelitis with wound nonhealing, dehiscence, purulent drainage from the wound. Admitted for IV antibiotics, hemodialysis and possible surgery. Pt with nonhealing L foot wound, amputation recommended.    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 RN reports eating well, feeding herself. PO intake 65-80% per flowsheets. Also receiving Prostat liquid protein 30 ml TID. Tolerating well. Stable for discharge. Mom working on selecting SNF. CBG's [phone]. HD schedule is TTS.  Diet Order:  Diet renal/carb modified with fluid restriction Diet-HS Snack? Nothing; Room service appropriate? Yes; Fluid consistency: Thin Diet - low sodium heart healthy  Skin:  Wound (see comment) (lt AKA w vac, UN rt heel, MASD anus) VAC removed 7/17  Last BM:  7/23  Height:   Ht Readings from Last 1 Encounters:  04/15/17 5' 8.5" (1.74 m)    Weight:   Wt Readings from Last 1 Encounters:  04/20/17 178 lb 9.2 oz (81 kg)    Ideal Body Weight:  63.6 kg  BMI:  Body mass  index is 26.76 kg/m.  Estimated Nutritional Needs:   Kcal:  2100-2300  Protein:  110-130 grams  Fluid:  Per MD  EDUCATION NEEDS:   No education needs identified at this time  Arthur Holms, RD, LDN Pager #: [phone] After-Hours Pager #: [phone]

## 2017-04-21 LAB — CBC
HCT: 30.8 % — ABNORMAL LOW (ref 36.0–46.0)
Hemoglobin: 9.4 g/dL — ABNORMAL LOW (ref 12.0–15.0)
MCH: 29 pg (ref 26.0–34.0)
MCHC: 30.5 g/dL (ref 30.0–36.0)
MCV: 95.1 fL (ref 78.0–100.0)
PLATELETS: 365 10*3/uL (ref 150–400)
RBC: 3.24 MIL/uL — AB (ref 3.87–5.11)
RDW: 17.6 % — AB (ref 11.5–15.5)
WBC: 7.2 10*3/uL (ref 4.0–10.5)

## 2017-04-21 LAB — RENAL FUNCTION PANEL
ALBUMIN: 2.4 g/dL — AB (ref 3.5–5.0)
Anion gap: 14 (ref 5–15)
BUN: 49 mg/dL — AB (ref 6–20)
CO2: 26 mmol/L (ref 22–32)
CREATININE: 5.31 mg/dL — AB (ref 0.44–1.00)
Calcium: 8.9 mg/dL (ref 8.9–10.3)
Chloride: 92 mmol/L — ABNORMAL LOW (ref 101–111)
GFR, EST AFRICAN AMERICAN: 11 mL/min — AB (ref >60)
GFR, EST NON AFRICAN AMERICAN: 9 mL/min — AB (ref >60)
Glucose, Bld: 90 mg/dL (ref 65–99)
PHOSPHORUS: 6.1 mg/dL — AB (ref 2.5–4.6)
Potassium: 3.8 mmol/L (ref 3.5–5.1)
Sodium: 132 mmol/L — ABNORMAL LOW (ref 135–145)

## 2017-04-21 LAB — GLUCOSE, CAPILLARY
GLUCOSE-CAPILLARY: 89 mg/dL (ref 65–99)
GLUCOSE-CAPILLARY: 155 mg/dL — AB (ref 65–99)
GLUCOSE-CAPILLARY: 130 mg/dL — AB (ref 65–99)
GLUCOSE-CAPILLARY: 130 mg/dL — AB (ref 65–99)

## 2017-04-21 MED ORDER — ALTEPLASE 2 MG IJ SOLR: 2.0000 mg | Freq: Once | INTRAMUSCULAR | Status: DC | PRN

## 2017-04-21 MED ORDER — PENTAFLUOROPROP-TETRAFLUOROETH EX AERO: 1.0000 "application " | INHALATION_SPRAY | CUTANEOUS | Status: DC | PRN

## 2017-04-21 MED ORDER — HEPARIN SODIUM (PORCINE) 1000 UNIT/ML DIALYSIS: 1000.0000 [IU] | INTRAMUSCULAR | Status: DC | PRN

## 2017-04-21 MED ORDER — SODIUM CHLORIDE 0.9 % IV SOLN: 100.0000 mL | INTRAVENOUS | Status: DC | PRN

## 2017-04-21 MED ORDER — LIDOCAINE HCL (PF) 1 % IJ SOLN: 5.0000 mL | INTRAMUSCULAR | Status: DC | PRN

## 2017-04-21 MED ORDER — LIDOCAINE-PRILOCAINE 2.5-2.5 % EX CREA: 1.0000 "application " | TOPICAL_CREAM | CUTANEOUS | Status: DC | PRN

## 2017-04-21 NOTE — Progress Notes (Signed)
PT Cancellation Note  Patient Details Name: [name] MRN: [id number] DOB: Aug 10, 1981   Cancelled Treatment:    Reason Eval/Treat Not Completed: Patient at procedure or test/unavailable Pt off floor at HD. Will follow up as time allows.   Blake Divine A [place] Dibella 04/21/2017, 8:23 AM Mylo Red, PT, DPT [phone]

## 2017-04-21 NOTE — Progress Notes (Signed)
Physical Therapy Treatment Patient Details Name: [name] MRN: [id number] DOB: 11/30/1980 Today's Date: 04/21/2017    History of Present Illness [name] is a 36 y.o. 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 ETT 5/19 and trach'd 5/31. Trach removed 7/16.    PT Comments    Pt progressing towards goals, limited by fatigue and dizziness. Cognition and assist levels improved since last session. Pt felt dizzy after sitting and weight shifting. BP decreased from 129/61 to 87/36 in sitting, after getting back in supine BP increased to 101/41.  Pt required VC's for bed mobility and sitting balance. Pt next TX to work in core strengthening to potentially be able to sit upright in chair safely.    Follow Up Recommendations  SNF;Supervision/Assistance - 24 hour     Equipment Recommendations  Other (comment)    Recommendations for Other Services       Precautions / Restrictions Precautions Precautions: Fall Restrictions Weight Bearing Restrictions: Yes RLE Weight Bearing: Non weight bearing LLE Weight Bearing: Non weight bearing Other Position/Activity Restrictions: PRAFO boot on RLE    Mobility  Bed Mobility Overal bed mobility: Needs Assistance Bed Mobility: Rolling;Sidelying to Sit;Sit to Supine Rolling: Min assist Sidelying to sit: Min assist;HOB elevated   Sit to supine: Mod assist   General bed mobility comments: VC's for technique/sequencing, hand placement, advancement of RLE. Pt sat EOB while weightshifting with VC's to lock elbows, PTA closely guarded to keep pt from falling over in sitting.   Transfers                    Ambulation/Gait                  Stairs            Wheelchair Mobility    Modified Rankin (Stroke Patients Only)       Balance Overall balance assessment: Needs assistance Sitting-balance support: Feet supported;Bilateral upper extremity supported Sitting balance-Leahy Scale: Poor Sitting balance - Comments: Pt unable to maintain sitting on EOB without assist.  Postural control: Posterior lean;Right lateral lean;Left lateral lean                                  Cognition Arousal/Alertness: Awake/alert Behavior During Therapy: WFL for tasks assessed/performed Overall Cognitive Status: Impaired/Different from baseline Area of Impairment: Memory;Safety/judgement                 Orientation Level: Disoriented to;Time (didnt know year, knew it was Tuesday) Current Attention Level: Selective   Following Commands: Follows one step commands with increased time Safety/Judgement: Decreased awareness of safety Awareness: Intellectual Problem Solving: Requires verbal cues;Requires tactile cues        Exercises Other Exercises Other Exercises: Anterior/Posterior pelvic tilts in supine X10 Other Exercises: Mini crunch- reaching to my hand from supine X10 BUE (straight forward, then side to side)    General Comments        Pertinent Vitals/Pain Pain Assessment: No/denies pain    Home Living  Prior Function            PT Goals (current goals can now be found in the care plan section) Acute Rehab PT Goals Patient Stated Goal: to get some rest PT Goal Formulation: With patient Potential to Achieve Goals: Good Progress towards PT goals: Progressing toward goals    Frequency    Min 3X/week      PT Plan Discharge plan needs to be updated    Co-evaluation PT/OT/SLP Co-Evaluation/Treatment: Yes Reason for Co-Treatment: Complexity of the patient's impairments (multi-system involvement);For patient/therapist safety PT goals addressed during  session: Mobility/safety with mobility;Balance OT goals addressed during session: ADL's and self-care      AM-PAC PT "6 Clicks" Daily Activity  Outcome Measure  Difficulty turning over in bed (including adjusting bedclothes, sheets and blankets)?: Total Difficulty moving from lying on back to sitting on the side of the bed? : Total Difficulty sitting down on and standing up from a chair with arms (e.g., wheelchair, bedside commode, etc,.)?: Total Help needed moving to and from a bed to chair (including a wheelchair)?: Total Help needed walking in hospital room?: Total Help needed climbing 3-5 steps with a railing? : Total 6 Click Score: 3    End of Session Equipment Utilized During Treatment: Gait belt;Other (comment) (PRAFO) Activity Tolerance: Patient limited by fatigue Patient left: in bed;with call bell/phone within reach;with family/visitor present Nurse Communication: Mobility status PT Visit Diagnosis: Muscle weakness (generalized) (M62.81);Other abnormalities of gait and mobility (R26.89) Pain - Right/Left: Right Pain - part of body: Ankle and joints of foot     Time: 4098-1191 PT Time Calculation (min) (ACUTE ONLY): 35 min  Charges:  $Neuromuscular Re-education: 8-22 mins                    G Codes:       [name], SPTA [phone]    [name] 04/21/2017, 5:05 PM

## 2017-04-21 NOTE — Consult Note (Signed)
WOC Nurse follow-up wound consult note Ortho service following for assessment and plan of care to left stump surgical site.  Reason for Consult: Right heel with unstageable pressure injury. Pressure Injury POA: No Measurement: 5X8cm Wound bed: 90% dry eschar; 10% red at loose edges which are beginning to lift and peel. No odor, drainage, or fluctuance. Dressing procedure/placement/frequency: Prevalon boot is in place to reduce pressure. Foam dressing to protect from further injury. It is best practice to leave dry stable eschar in place. No topical treatment is indicated at this time; will plan to re-assess weekly.  Cammie Mcgee MSN, RN, CWOCN, [place], CNS [phone].

## 2017-04-21 NOTE — Progress Notes (Signed)
Occupational Therapy Treatment Patient Details Name: [name] MRN: [id number] DOB: 20-Jan-1981 Today's Date: 04/21/2017    History of present illness [name] is a 36 y.o. 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 ETT 5/19 and trach'd 5/31. Trach removed 7/16.   OT comments  Worked with pt on EOB sitting tolerance and balance.  She sat EOB x ~18 mins with min - max A.  Pt spontaneously falls to side or posteriorly when distracted or fatigued.   She complained of dizziness EOB - BP supine 129/61; sitting 87/36; and 101/41 once she was returned to supine.  Feel best option for transport to HD will be in a tilt in space or reclining w/c with lap belt and chest harness.  Will continue to follow.   Follow Up Recommendations  SNF;Supervision/Assistance - 24 hour    Equipment Recommendations  Wheelchair (measurements OT);Wheelchair cushion (measurements OT);Hospital bed;3 in 1 bedside commode    Recommendations for Other Services      Precautions / Restrictions Precautions Precautions: Fall Restrictions Weight Bearing Restrictions: Yes RLE Weight Bearing: Non weight bearing LLE Weight Bearing: Non weight bearing Other Position/Activity Restrictions: PRAFO boot on RLE       Mobility Bed Mobility Overal bed mobility: Needs Assistance Bed Mobility: Rolling;Sidelying to Sit;Sit to Supine Rolling: Min assist Sidelying to sit: Min assist;HOB elevated Supine to sit: +2 for physical assistance;Max assist Sit to supine: Mod assist Sit to sidelying: Mod assist General bed mobility comments: VC's for technique/sequencing, hand placement, advancement of RLE. Pt sat EOB while weightshifting  with VC's to lock elbows, PTA closely guarded to keep pt from falling over in sitting.   Transfers                      Balance Overall balance assessment: Needs assistance Sitting-balance support: Feet supported;Bilateral upper extremity supported Sitting balance-Leahy Scale: Poor Sitting balance - Comments: Pt requires min - max A for sitting balance EOB.  She falls posteriorly and to the right/left  when fatigued  Postural control: Posterior lean;Right lateral lean;Left lateral lean                                 ADL either performed or assessed with clinical judgement   ADL                                               Vision       Perception     Praxis      Cognition Arousal/Alertness: Awake/alert Behavior During Therapy: Flat affect;WFL for tasks assessed/performed Overall Cognitive Status: Impaired/Different from baseline Area of Impairment: Attention;Memory;Following commands;Safety/judgement;Awareness;Problem solving;Orientation                 Orientation Level: Disoriented to Current Attention Level: Sustained Memory: Decreased recall of precautions;Decreased short-term memory Following Commands: Follows one step commands consistently Safety/Judgement: Decreased awareness of safety Awareness: Intellectual Problem Solving: Slow processing;Decreased initiation;Difficulty sequencing;Requires verbal cues;Requires tactile cues  Exercises Other Exercises Other Exercises: Anterior/Posterior pelvic tilts in supine X10 Other Exercises: Mini crunch- reaching to my hand from supine X10 BUE (straight forward, then side to side)   Shoulder Instructions       General Comments Pt with symptomatic orthostatic hypotension while sitting EOB.  BP:  supine 129/61; sitting 87/34; Pt returned to supine with pressure slowly increasing to 101/41    Pertinent Vitals/ Pain       Pain Assessment: No/denies pain  Home  Living                                          Prior Functioning/Environment              Frequency  Min 2X/week        Progress Toward Goals  OT Goals(current goals can now be found in the care plan section)  Progress towards OT goals: Progressing toward goals (but limted by orthostatic hypotension )  Acute Rehab OT Goals Patient Stated Goal: to get some rest  Plan Discharge plan remains appropriate    Co-evaluation    PT/OT/SLP Co-Evaluation/Treatment: Yes Reason for Co-Treatment: Complexity of the patient's impairments (multi-system involvement);For patient/therapist safety PT goals addressed during session: Mobility/safety with mobility;Balance OT goals addressed during session: ADL's and self-care      AM-PAC PT "6 Clicks" Daily Activity     Outcome Measure   Help from another person eating meals?: A Little Help from another person taking care of personal grooming?: A Lot Help from another person toileting, which includes using toliet, bedpan, or urinal?: Total Help from another person bathing (including washing, rinsing, drying)?: A Lot Help from another person to put on and taking off regular upper body clothing?: Total Help from another person to put on and taking off regular lower body clothing?: Total 6 Click Score: 10    End of Session    OT Visit Diagnosis: Other abnormalities of gait and mobility (R26.89);Muscle weakness (generalized) (M62.81);Feeding difficulties (R63.3);Other symptoms and signs involving cognitive function;Cognitive communication deficit (R41.841)   Activity Tolerance Treatment limited secondary to medical complications (Comment)   Patient Left in bed;with call bell/phone within reach;with family/visitor present   Nurse Communication Other (comment) (orthostatic hypotension )        Time: 1610-9604 OT Time Calculation (min): 35 min  Charges: OT General Charges $OT Visit: 1 Procedure OT  Treatments $Therapeutic Activity: 8-22 mins  [employer], OTR/L [phone]    Jeani Hawking M 04/21/2017, 5:11 PM

## 2017-04-21 NOTE — Progress Notes (Signed)
OT Cancellation Note  Patient Details Name: Analie Kice MRN: [id number] DOB: 04-04-1981   Cancelled Treatment:    Reason Eval/Treat Not Completed: Patient at procedure or test/ unavailable.  Pt in HD.  Meloni [employer], OTR/L [phone]   Jeani Hawking M 04/21/2017, 10:04 AM

## 2017-04-21 NOTE — Plan of Care (Signed)
Problem: Health Behavior/Discharge Planning: Goal: Ability to manage health-related needs will improve Outcome: Progressing Patient's mother continues to work to try and find a place that she is comfortable having Tarisha placed in. Patient was unable to tolerate being up in wheelchair for an hour and thus not a candidate for [employer].

## 2017-04-21 NOTE — Progress Notes (Addendum)
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   Admitted on 02/14/17 to [facility] with left foot/leg pain, wound dehiscence and purulent drainage from the left trans-metatarsal amputation site.   She was subsequently found to be unresponsive on 5/19. Noted to bein asystole and CPR started with ROSC in approximately 9 minutes. She was intubated by anaesthesia, transferred to the intensive care unit and subsequently underwent cooling protocol. She couldn't be weaned off the ventilator and as a result underwent tracheostomy on 5/31. She underwent decannulation on 7/16.  Neurological/ Cognitive status has been poor. She has been evaluated by neurology on 5/29 and, per neurology, chances of meaningful neurological recovery appeared to be dismal.  Currently she is able to follow simple commands and is alert and is eating.  Hospital course complicated by persistent leukocytosis, respiratory failure and left transmetatarsal wound dehiscence with gangrene. 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  Over the course of her hospitalizations, she has been treated for multiple episodes of recurrent aspiration pneumonias.  Currently off antibiotics and awaiting placement.  Patient was supposed to be discharged on 7/23, but she could not sit in a normal wheel chair to be transported to the HD, due to poor truncal stability, and the facility declined to take her until she is able to sit in a normal wheel chair without sliding down  for transport to and back from the HD.   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)   Diabetic osteomyelitis (HCC)   Dehiscence of amputation stump (HCC)   Osteomyelitis (HCC)   Chronic pain syndrome   Cardiac arrest, cause unspecified (HCC)   Pressure injury of skin   Wound dehiscence   Coma (HCC)   Elevated liver enzymes   Jaundice   Encounter for PEG (percutaneous endoscopic gastrostomy) (HCC)   Protein calorie malnutrition (HCC)   DNR (do not resuscitate) discussion   Palliative care by specialist   Tachycardia   Goals of care, counseling/discussion   Anoxic brain injury [facility])   Palliative care encounter   Tracheostomy status (HCC)   Abscess of left leg   Hypoxemia   Fever   Aspiration into airway   Acute on chronic respiratory failure, in the setting of cardiac arrest with superimposed recurrent aspiration pneumonias during her hospitalization.  S/p intubation on 5/19 and extubated 5/31, tracheostomy, and de cannulated on 7/16.  - currently on RA, and off antibiotics.  - patient is eating without assistance and has a good appetite. - no respiratory issues so far.  - no new complaints.    ESRD on HD  TTS per nephrology.  Nephrology on board and recommendations given.  Tolerated HD in chair well.  Off midodrine.  bp parameters good.      Left trans metatarsal stump wound dehiscence  with infection and gangrene: - underwent AKA, because of wound dehiscence and osteomyelitis. She underwent revision of the AKA on 7/6.  - dry dressings as needed.  - off the wound vac.  - no new complaints.     Right heel eschar/ pressure ulcers on the sacral area:  Wound care consulted and  recommendations given.  Dry dressings.   Type 2 DM:  CBG (last 3)   Recent Labs  04/20/17 1209 04/20/17 1713 04/20/17 2124  GLUCAP 157* 123* 150*    Resume SSI and lantus of 8 units.  No change in insulin regimen.    Anoxic brain injury:  Slow cognitive improvement  Following some commands and talking.  No agitation.     Normocytic anemia:  Anemia of chronic disease worsened by acute illness .  Hemoglobin stable around 8.  darbopoetin once every week.    NICM, WITH chronic systolic heart failure:  She appears to be compensated.  Fluid management with HD.  Resume coreg. Hold for SBP<90MMHG.    Pericardial effusion without evidence of tamponade physiology.   Moderate protein calorie malnutrition:  Dietary consulted and recommendations given.   Depression;  Resumed home cymbalta.    Dysphagia:  On dys 2 diet with nectar thick liquids.   Hypertension:  Well controlled. Resume coreg.    CIR consutled to see if she is a candidate for acute rehab. But deferred to SNF. Patient was supposed to be discharged on 7/23, but she couldn't sit int he wheel chair without sliding down,  Due to poor truncal control and deemed not safe to be discharged until she can sit in the wheel chair properly without sliding down, .  Daily PT ordered.   DVT prophylaxis:heparin sq Code Status: full code.  Family Communication: mom at bedside.  Disposition Plan:SNF placement when bed available.  Consultants:   PCCM as needed  Nephrology  Palliative care consult   Orthopedics,    Procedures: STUDIES:  Lower extremity ultrasound 5/18 >> no evidence of DVT CT head 5/19 >> normal exam Echo 5/20 >> EF 30-35%, increased LVF. Diffuse hypokinesis, akinesis of basilar mid-inferior myocardium, grade 2 diastolic dysfxn  EEG 5/20 >> finding c/w mod to severe global cerebral dysfxn. C/w anoxic injury given clinical course  LE [place] 5/21 >> negative MRI brain 5/24 >> ?thrombosed cortical  vein, otherwise normal TEE 6/4 >> mild MR, normal AV, mild TR, mild PR, EF 30-35%, diffuse hypokinesis, no thrombus, no PFO, normal RV, moderate pericardial effusion with synechia suggesting some chronicity, no vegetations   Antimicrobials: Clinidamycin 5/19 >> 5/21 Vancomycin 5/19 >> 5/25 >> restart 5/30 Zosyn 5/19 >> 5/22 Meropenem 5/22 >> 5/30 Flagyl 5/30 >> off Ceftriaxone 5/30 >> 6/4  SIGNIFICANT EVENTS: 5/19  Admit, cardiac arrest, transfer to ICU; hypothermia protocol started 5/24  Off pressors.  MRI w/out evidence of ischemia  6/03  Tolerated ATC couple hours, then back to vent, HD  7/11  Trach capped  7/16  On RA.  Trach dislodged over the weekend, replaced without difficulty. Decannulated late PM  LINES/TUBES: ETT 5/19 >> 5/31 Lt IJ CVL 5/19 >> out Trach 5/31 >> 7/16  Subjective: No new complaints.  Objective: Vitals:   04/20/17 2009 04/20/17 2200 04/21/17 0007 04/21/17 0500  BP: (!) 117/33 (!) 94/50 118/77 (!) 123/53  Pulse: 96 99 97   Resp: 17  (!) 24 (!) 9  Temp: 98.2 F (36.8 C)  98 F (36.7 C) 97.6 F (36.4 C)  TempSrc: Oral  Oral Oral  SpO2: 99% 97% 97% 98%  Weight:    86 kg (189 lb 9.5 oz)  Height:       No intake or output data in the 24 hours ending 04/21/17 0817 Filed Weights   04/20/17 0500 04/20/17 0553 04/21/17 0500  Weight: 81 kg (178 lb 9.2 oz) 81 kg (178 lb 9.2 oz) 86 kg (189 lb 9.5 oz)    Examination: not changed from yesterday.   General exam: Appears calm and comfortable on RA.  Respiratory system: Good air entry. No wheezing or rhonchi.  Cardiovascular system: S1 & S2 heard, no murmer, tachycardic.   Gastrointestinal system: Abdomen is soft NT ND BS+ Central nervous system:alert , following some commands. Able to move all extremities.   Extremities: left Aka. Normal tone. Right lower extremity in boot. Skin: sacral area ulcer, right heel ischar.  Psychiatry: no agitation, calm.     Data Reviewed: I have personally reviewed  following labs and imaging studies  CBC:  Recent Labs Lab 04/16/17 1407 04/18/17 1730  WBC 9.2 8.3  HGB 8.6* 8.9*  HCT 29.6* 30.3*  MCV 98.0 98.1  PLT 399 374   Basic Metabolic Panel:  Recent Labs Lab 04/16/17 1408 04/18/17 1729  NA 133* 131*  K 4.8 4.1  CL 94* 92*  CO2 24 27  GLUCOSE 156* 102*  BUN 51* 40*  CREATININE 5.00* 4.64*  CALCIUM 9.1 9.0  PHOS 4.8* 4.9*   GFR: Estimated Creatinine Clearance: 19.3 mL/min (A) (by C-G formula based on SCr of 4.64 mg/dL (H)). Liver Function Tests:  Recent Labs Lab 04/16/17 1408 04/18/17 1729  ALBUMIN 2.3* 2.3*   No results for input(s): LIPASE, AMYLASE in the last 168 hours. No results for input(s): AMMONIA in the last 168 hours. Coagulation Profile: No results for input(s): INR, PROTIME in the last 168 hours. Cardiac Enzymes: No results for input(s): CKTOTAL, CKMB, CKMBINDEX, TROPONINI in the last 168 hours. BNP (last 3 results) No results for input(s): PROBNP in the last 8760 hours. HbA1C: No results for input(s): HGBA1C in the last 72 hours. CBG:  Recent Labs Lab 04/20/17 0735 04/20/17 1011 04/20/17 1209 04/20/17 1713 04/20/17 2124  GLUCAP 122* 115* 157* 123* 150*   Lipid Profile: No results for input(s): CHOL, HDL, LDLCALC, TRIG, CHOLHDL, LDLDIRECT in the last 72 hours. Thyroid Function Tests: No results for input(s): TSH, T4TOTAL, FREET4, T3FREE, THYROIDAB in the last 72 hours. Anemia Panel: No results for input(s): VITAMINB12, FOLATE, FERRITIN, TIBC, IRON, RETICCTPCT in the last 72 hours. Sepsis Labs: No results for input(s): PROCALCITON, LATICACIDVEN in the last 168 hours.  No results found for this or any previous visit (from the past 240 hour(s)).       Radiology Studies: No results found.      Scheduled Meds: . carvedilol  6.25 mg Oral BID WC  . darbepoetin (ARANESP) injection - DIALYSIS  200 mcg Intravenous Q Thu-HD  . dextrose  1 ampule Intravenous Once  . docusate sodium  100 mg  Oral BID  . DULoxetine  30 mg Oral Daily  . feeding supplement (PRO-STAT SUGAR FREE 64)  30 mL Oral TID  . [name]'s butt cream   Topical QID  . heparin  5,000 Units Subcutaneous Q8H  . insulin aspart  0-15 Units Subcutaneous TID WC  . insulin glargine  8 Units Subcutaneous Daily  . mouth rinse  15 mL Mouth Rinse BID  . multivitamin  1 tablet Oral QHS  . sodium chloride flush  3 mL Intravenous Q12H  Continuous Infusions: . sodium chloride    . sodium chloride    . sodium chloride    . sodium chloride       LOS: 66 days    Time spent:25 minutes.     Kathlen Mody, MD [facility] Pager [phone]  If 7PM-7AM, please contact night-coverage [email or web address] Password [facility] 04/21/2017, 8:17 AM

## 2017-04-21 NOTE — Procedures (Signed)
Stable on HD upstairs.  No complaints.  Doing well w/ trach out.  Stable BP's on HD.    I was present at this dialysis session, have reviewed the session itself and made  appropriate changes Vinson Moselle MD [facility] pager [phone]   04/21/2017, 12:04 PM

## 2017-04-22 DIAGNOSIS — R531 Weakness: Secondary | ICD-10-CM

## 2017-04-22 LAB — GLUCOSE, CAPILLARY
GLUCOSE-CAPILLARY: 151 mg/dL — AB (ref 65–99)
Glucose-Capillary: 110 mg/dL — ABNORMAL HIGH (ref 65–99)
Glucose-Capillary: 126 mg/dL — ABNORMAL HIGH (ref 65–99)
Glucose-Capillary: 122 mg/dL — ABNORMAL HIGH (ref 65–99)
Glucose-Capillary: 152 mg/dL — ABNORMAL HIGH (ref 65–99)

## 2017-04-22 NOTE — Progress Notes (Signed)
PT Cancellation Note  Patient Details Name: Harumi Coger MRN: [id number] DOB: [birth year]   Cancelled Treatment:    Reason Eval/Treat Not Completed: Other (comment) (being transferred to new department)   Vena Austria 04/22/2017, 8:13 PM Durenda Hurt. Renaldo Fiddler, [facility] Pager [phone]

## 2017-04-22 NOTE — Plan of Care (Signed)
Problem: Health Behavior/Discharge Planning: Goal: Ability to manage health-related needs will improve Outcome: Progressing Mother at bedside assisting with health-related needs, patient is confused and unable to care for self at this time.  Problem: Activity: Goal: Risk for activity intolerance will decrease Outcome: Progressing Up to chair for HD, moving self in bed  Problem: Fluid Volume: Goal: Ability to maintain a balanced intake and output will improve Outcome: Progressing HD

## 2017-04-22 NOTE — Progress Notes (Signed)
[facility] Rounding Note  Subjective:  No new issues.  Has not been able to sit in a wheelchair yet, slides out when attempting.   Vital signs in last 24 hours: Vitals:   04/22/17 0400 04/22/17 0500 04/22/17 0600 04/22/17 0922  BP: (!) 150/98 131/87 (!) 141/91 (!) 147/98  Pulse: 93 (!) 114    Resp: (!) 0 18 11 (!) 27  Temp:    (!) 97.4 F (36.3 C)  TempSrc:    Oral  SpO2: 97% 95%    Weight:  80 kg (176 lb 5.9 oz)    Height:       Weight change: -6 kg (-13 lb 3.6 oz)  Intake/Output Summary (Last 24 hours) at 04/22/17 1218 Last data filed at 04/22/17 1610  Gross per 24 hour  Intake              483 ml  Output                0 ml  Net              483 ml   Physical Exam: GEN Alert, NAD HEENT: prior trach site healing PULM: normal WOB, clear bilaterally CV tachycardic  ABD obese, soft and non tender EXT: Left AKA dressed, no edema, Right foot in protective boot Dialysis Access: Left AVF + bruit and thrill  Dialysis: TTS [facility] 4h   F200  114kg   2/2 bath  LUA AVF  Hep none - Iron Sucrose (Venofer) 50 mg IVP During Dialysis 1X Week  - Mircera 75 mcg IV q  2 weeks ( last on 02/05/17) - Vitamin D (Calcitriol) Oral 0.75 mcg  Po q hd   Summary: 36 year old female w/ ESRD d/t poorly controlled DM, HD bilateral TMA's, obesity, HTN, medical non-compliance, NICM. Admitted 5/19 w/left leg pain, + radiological evidence of osteomyelitis w/wound nonhealing, dehiscence, purulent drainage. Admitted for IV antibiotics, hemodialysis and possible surgery. Found unresponsive in her room that afternoon - cardiac arrest > rec'd  CPR, epi bicarb; intubated; cooled. Then developed anoxic brain injury, trach and s/p L AKA this hosp. Course further complicated by recurrent aspiration PNA, dehiscence of AK wound requiring revision 7/6.  Now decannulated, ABI improving, and pt stable and ready for discharge pending choosing a SNF.   Assessment/ Plan: 1. Anoxic brain injury: s/p cardiac arrest.  Much  improved. 2. Resp failure, sp trach: decannulated 7/16. 3. ESRD: TTS HD.  Continue normal schedule.  No heparin 4. Debility: per SW notes SNF will not take pt until she can sit in WC 5. HTN/ volume: On low-dose coreg.  Required midodrine before, now d/c'd.  EDW appears to be ~ 80 kg. 6. Anemia: Darbopoetin 200 q week.  Repeat iron panel with Tsat 36% 7/19, no need for IV iron 7. Bone/ Mineral:  Renal diet, no binder, calcitriol d/c'd due to increasing Ca.  PTH 88 here.  If still here next week, need to repeat PTH. 8. 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  S/p woundvac, now being dressed. 9. DM 2:  per primary 10. Chronic systolic CHF: EF 96-04%; improving BP, on BB 11. Nutrition: albumin 2.2.  Taking prostat. 12. Dispo: to SNF hopefully soon    Vinson Moselle MD [facility] pgr [phone]   04/22/2017, 12:18 PM         Recent Labs Lab 04/16/17 1408 04/18/17 1729 04/21/17 0800  NA 133* 131* 132*  K 4.8 4.1  3.8  CL 94* 92* 92*  CO2 24 27 26   GLUCOSE 156* 102* 90  BUN 51* 40* 49*  CREATININE 5.00* 4.64* 5.31*  CALCIUM 9.1 9.0 8.9  PHOS 4.8* 4.9* 6.1*    Recent Labs Lab 04/16/17 1408 04/18/17 1729 04/21/17 0800  ALBUMIN 2.3* 2.3* 2.4*    Recent Labs Lab 04/16/17 1407 04/18/17 1730 04/21/17 0800  WBC 9.2 8.3 7.2  HGB 8.6* 8.9* 9.4*  HCT 29.6* 30.3* 30.8*  MCV 98.0 98.1 95.1  PLT 399 374 365     Recent Labs Lab 04/21/17 1636 04/21/17 1757 04/21/17 2202 04/22/17 0734 04/22/17 1204  GLUCAP 155* 130* 130* 110* 126*    Medications: Infusions: . sodium chloride    . sodium chloride      Scheduled Medications: . carvedilol  6.25 mg Oral BID WC  . darbepoetin (ARANESP) injection - DIALYSIS  200 mcg Intravenous Q Thu-HD  . dextrose  1 ampule Intravenous Once  . docusate sodium  100 mg Oral BID  . DULoxetine  30 mg Oral Daily  . feeding supplement (PRO-STAT SUGAR FREE 64)  30 mL Oral TID  . [name]'s butt cream   Topical  QID  . heparin  5,000 Units Subcutaneous Q8H  . insulin aspart  0-15 Units Subcutaneous TID WC  . insulin glargine  8 Units Subcutaneous Daily  . mouth rinse  15 mL Mouth Rinse BID  . multivitamin  1 tablet Oral QHS  . sodium chloride flush  3 mL Intravenous Q12H

## 2017-04-22 NOTE — Progress Notes (Signed)
  Speech Language Pathology Treatment: Dysphagia  Patient Details Name: [name] MRN: [id number] DOB: 1981-05-08 Today's Date: 04/22/2017 Time: 2863-8177 SLP Time Calculation (min) (ACUTE ONLY): 10 min  Assessment / Plan / Recommendation Clinical Impression  Pt seen for diet tolerance follow up post MBS and diet upgrade to regular thin liquids. SLP arrived after lunch meal, RN reports excellent tolerance and intake. SLP observed pt with thin liquids, offered PO solid snack however pt declined. No overt s/sx of aspiration evidenced with any PO this date. Reinforced safe swallow strategy for intermittent throat clearing for maximizing airway protection.  Pt required increased cueing for completion.  SLP will continue to follow up for cognitive intervention and swallow safety during acute stay.      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 ETT 5/19 and trach'd 5/31. Hospital course included cardiac arrest, transfer to ICU. Pt post decannulation of tracheostomy.      SLP Plan  Continue with current plan of care       Recommendations  Diet recommendations: Regular;Thin liquid Liquids provided via: Cup;Straw Medication Administration: Whole meds with puree Supervision: Patient able to self feed;Full supervision/cueing for compensatory strategies Compensations: Slow rate;Small sips/bites;Clear throat intermittently Postural Changes and/or Swallow Maneuvers: Seated upright 90 degrees      Patient may use Passy-Muir Speech Valve:  (NA)         Oral Care Recommendations: Oral care BID Follow up Recommendations: Skilled Nursing facility SLP Visit Diagnosis: Dysphagia, oropharyngeal phase  (R13.12) Plan: Continue with current plan of care       GO               Marcene Duos MA, CCC-SLP Acute Care Speech Language Pathologist    Kennieth Rad 04/22/2017, 3:15 PM

## 2017-04-22 NOTE — Progress Notes (Signed)
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   Admitted on 02/14/17 to [facility] with left foot/leg pain, wound dehiscence and purulent drainage from the left trans-metatarsal amputation site.  She was subsequently found to be unresponsive on 5/19. Noted to bein asystole and CPR started with ROSC in approximately 9 minutes. She was intubated by anaesthesia, transferred to the intensive care unit and subsequently underwent cooling protocol. She couldn't be weaned off the ventilator and as a result underwent tracheostomy on 5/31. She underwent decannulation on 7/16.  Neurological/ Cognitive status has been poor. She has been evaluated by neurology on 5/29 and, per neurology, chances of meaningful neurological recovery appeared to be dismal.  Currently she is able to follow simple commands and is alert and is eating.  Hospital course complicated by persistent leukocytosis, respiratory failure and left transmetatarsal wound dehiscence with gangrene. 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  Over the course of her hospitalizations, she has been treated for multiple episodes of recurrent aspiration pneumonias.  Currently off antibiotics and awaiting placement.  Patient was supposed to be discharged on 7/23, but she could not sit in a normal wheel chair to be transported to the HD, due to poor truncal stability, and the facility declined to take her until she is able to sit in a normal wheel chair without sliding down  for transport to and back from the HD.    Subjective: No complaints. ROS: no complaints of nausea, vomiting, constipation diarrhea,  cough, dyspnea or dysuria. No other complaints.   Assessment & Plan:  Acute /chronichypoxemic vent dependent respiratory failure in a setting of cardiac arrest Aspiration pneumonia- RLL  Tracheostomy  - Intubated 6/19-6/20.  5/31- tracheostomy- - PCCM following  - S/P MBS 6/23. Now status post FEES 6/27. Patient started on nectar thick liquids and dysphagia 1 diet 6/27. Holding off on PEG   - has had aspiration issues- recurrent aspiration on 6/28-  Repeat chest x-ray on 6/29 showed new right-sided infiltrate - She was started on Fortaz on 6/29 and changed over to Zosyn on 6/30   - Chest x-ray from 7/1 showed improving opacity in the right lung. 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  - Orthopedics removed wound VAC 6/20 - revision of her left AKA  04/03/17      Right heel eschar - wound care RN following - Prevalon boot  Ulcers on sacral area- incontinence associated dermatitis - WOC >> Cleanse and apply Purple Top Criticaid Cream BID and PRN incontinence of urine or stool - also applying barrier cream -  air mattress    Diabetes mellitus 2 - cont Lantus 8 U and SSI with meals  Thrush - resolved with treatment with Diflucan and Nystatin  Anoxic brain injury secondary to Cardiac arrest on 5/19: - slow cognitive improvement- following commands, talking & feeding herself  now  Diarrhea  - resolved    Normocytic anemia Likely secondary to chronic disease-probably worsened by acute illness. No signs of bleeding, hemoglobin has been stable with some fluctuations.   Diabetes mellitus type 2  Continue with SSI. On Lantus. HbA1c 5.9.   Chronic systolic heart failure/nonischemic cardiomyopathy - Echocardiogram>> EF 25-30% with diffuse hypokinesis - volume management with dialysis - has been unable to tolerate Coreg due to hypotension  - resumed as BP rising  Hypotension/ HTN - Midodrine with dialysis to be discontinued today due to resolution of hypotension - using Coreg now    Pericardial effusion Seen on TEE- no evidence of tamponade pathophysiology. Repeat echo 6/26 with no significant pericardial effusion.   Moderate protein calorie malnutrition - taking orals well now- cont Prostat  Depression?  - has been on Cymbalta at home which was being held-   resumed on 7/13  Dysphagia  - D 2 diet with nectar thick liquids     DVT prophylaxis: Heparin Code Status: Full code Family Communication: mother Disposition Plan: SNF  Consultants:   PCCM    Nephrology  Palliative care consult   Orthopedics,  Procedures:  Lower extremity ultrasound 5/18 >> no evidence of DVT CT head 5/19 >> normal exam Echo 5/20 >>EF 30-35%, increased LVF. Diffuse hypokinesis, akinesis of basilar mid-inferior myocardium, grade 2 diastolic dysfxn  EEG 5/20 >>finding c/w mod to severe global cerebral dysfxn. C/w anoxic injury given clinical course  LE [place] 5/21 >> negative MRI brain 5/24 >> ?thrombosed cortical vein, otherwise normal TEE 6/4 >>mild MR, normal AV, mild TR, mild PR, EF 30-35%, diffuse hypokinesis, no thrombus, no PFO, normal RV, moderate pericardial effusion with synechia suggesting some chronicity, no vegetations   Antimicrobials:  Anti-infectives    Start     Dose/Rate Route Frequency Ordered Stop   04/13/17 1000  fluconazole (DIFLUCAN)  tablet 100 mg     100 mg Oral Daily 04/12/17 1356 04/15/17 0959   04/08/17 1000  fluconazole (DIFLUCAN) IVPB 100 mg  Status:  Discontinued     100 mg 50 mL/hr over 60 Minutes Intravenous Every 24 hours 04/08/17 0851 04/12/17 1356   04/04/17 0600  ceFAZolin (ANCEF) IVPB 2g/100 mL premix  Status:  Discontinued     2 g 200 mL/hr over 30 Minutes Intravenous On call to O.R. 04/03/17 1840 04/03/17 1848   03/28/17 1600  cefTAZidime (FORTAZ) 1 g in dextrose 5 % 50 mL IVPB  Status:  Discontinued     1 g 100 mL/hr over 30 Minutes Intravenous Every 24 hours 03/27/17 0814 03/28/17 0752   03/28/17 1100  piperacillin-tazobactam (ZOSYN) IVPB 3.375 g     3.375 g 12.5 mL/hr over 240 Minutes Intravenous Every 12 hours 03/28/17 1038 04/11/17 1359   03/27/17 0815  cefTAZidime (FORTAZ) 1 g in dextrose 5 % 50 mL IVPB     1 g 100 mL/hr over 30 Minutes Intravenous  Once 03/27/17 0814 03/27/17 1026   03/17/17 1800  cefTAZidime (FORTAZ) 2 g in dextrose 5 % 50 mL IVPB  Status:  Discontinued     2 g 100 mL/hr over 30 Minutes Intravenous Every T-Th-Sa (1800) 03/15/17 1143 03/18/17 1223   03/17/17 1200  vancomycin (VANCOCIN) IVPB  1000 mg/200 mL premix  Status:  Discontinued     1,000 mg 200 mL/hr over 60 Minutes Intravenous Every T-Th-Sa (Hemodialysis) 03/15/17 1143 03/18/17 1223   03/14/17 1200  vancomycin (VANCOCIN) IVPB 1000 mg/200 mL premix  Status:  Discontinued     1,000 mg 200 mL/hr over 60 Minutes Intravenous Every T-Th-Sa (Hemodialysis) 03/12/17 0912 03/14/17 1542   03/13/17 1430  ceFAZolin (ANCEF) IVPB 2g/100 mL premix  Status:  Discontinued     2 g 200 mL/hr over 30 Minutes Intravenous To ShortStay Surgical 03/12/17 1208 03/13/17 1113   03/13/17 1115  ceFAZolin (ANCEF) IVPB 2g/100 mL premix     2 g 200 mL/hr over 30 Minutes Intravenous To Surgery 03/13/17 1108 03/13/17 1506   03/12/17 1800  cefTAZidime (FORTAZ) 2 g in dextrose 5 % 50 mL IVPB  Status:  Discontinued     2 g 100 mL/hr over 30 Minutes  Intravenous Every T-Th-Sa (1800) 03/12/17 0912 03/14/17 1542   03/12/17 1200  vancomycin (VANCOCIN) 1,500 mg in sodium chloride 0.9 % 250 mL IVPB     1,500 mg 250 mL/hr over 60 Minutes Intravenous Every Thu (Hemodialysis) 03/12/17 0912 03/12/17 1356   03/02/17 1200  cefTRIAXone (ROCEPHIN) 2 g in dextrose 5 % 50 mL IVPB     2 g 100 mL/hr over 30 Minutes Intravenous Every 24 hours 03/02/17 0810 03/06/17 1358   03/02/17 1200  vancomycin (VANCOCIN) IVPB 1000 mg/200 mL premix     1,000 mg 200 mL/hr over 60 Minutes Intravenous Every M-W-F (Hemodialysis) 03/02/17 0815 03/02/17 1457   03/02/17 1200  metroNIDAZOLE (FLAGYL) IVPB 500 mg     500 mg 100 mL/hr over 60 Minutes Intravenous Every 8 hours 03/02/17 0948 03/06/17 2230   02/26/17 1200  vancomycin (VANCOCIN) IVPB 1000 mg/200 mL premix     1,000 mg 200 mL/hr over 60 Minutes Intravenous Every T-Th-Sa (Hemodialysis) 02/25/17 1151 03/05/17 1325   02/25/17 1200  vancomycin (VANCOCIN) 2,000 mg in sodium chloride 0.9 % 500 mL IVPB     2,000 mg 250 mL/hr over 120 Minutes Intravenous  Once 02/25/17 1149 02/25/17 1449   02/25/17 0900  cefTRIAXone (ROCEPHIN) 2 g in dextrose 5 % 50 mL IVPB  Status:  Discontinued     2 g 100 mL/hr over 30 Minutes Intravenous Every 24 hours 02/25/17 0814 03/02/17 0810   02/25/17 0900  metroNIDAZOLE (FLAGYL) IVPB 500 mg  Status:  Discontinued     500 mg 100 mL/hr over 60 Minutes Intravenous Every 8 hours 02/25/17 0814 03/02/17 0948   02/20/17 1200  vancomycin (VANCOCIN) IVPB 1000 mg/200 mL premix  Status:  Discontinued     1,000 mg 200 mL/hr over 60 Minutes Intravenous Every M-W-F (Hemodialysis) 02/19/17 1506 02/20/17 1403   02/17/17 1800  meropenem (MERREM) 500 mg in sodium chloride 0.9 % 50 mL IVPB  Status:  Discontinued     500 mg 100 mL/hr over 30 Minutes Intravenous Daily-1800 02/17/17 1026 02/25/17 0814   02/17/17 1200  vancomycin (VANCOCIN) IVPB 1000 mg/200 mL premix     1,000 mg 200 mL/hr over 60 Minutes  Intravenous Every T-Th-Sa (Hemodialysis) 02/14/17 1758 02/19/17 1612   02/14/17 2200  clindamycin (CLEOCIN) IVPB 900 mg  Status:  Discontinued     900 mg 100 mL/hr over 30 Minutes Intravenous Every 8 hours 02/14/17 1106 02/14/17 1928   02/14/17 2000  clindamycin (CLEOCIN) IVPB 900 mg  Status:  Discontinued     900 mg 100 mL/hr over 30 Minutes Intravenous Every 8  hours 02/14/17 1928 02/16/17 1047   02/14/17 1915  piperacillin-tazobactam (ZOSYN) IVPB 3.375 g  Status:  Discontinued     3.375 g 100 mL/hr over 30 Minutes Intravenous Every 12 hours 02/14/17 1801 02/17/17 1026   02/14/17 1845  vancomycin (VANCOCIN) 2,000 mg in sodium chloride 0.9 % 500 mL IVPB     2,000 mg 250 mL/hr over 120 Minutes Intravenous  Once 02/14/17 1758 02/14/17 2107   02/14/17 1115  clindamycin (CLEOCIN) IVPB 900 mg  Status:  Discontinued     900 mg 100 mL/hr over 30 Minutes Intravenous  Once 02/14/17 1106 02/14/17 2236   02/14/17 0945  clindamycin (CLEOCIN) IVPB 600 mg     600 mg 100 mL/hr over 30 Minutes Intravenous  Once 02/14/17 0931 02/14/17 1013       Objective: Vitals:   04/22/17 0500 04/22/17 0600 04/22/17 0922 04/22/17 1200  BP: 131/87 (!) 141/91 (!) 147/98 138/81  Pulse: (!) 114     Resp: 18 11 (!) 27 (!) 27  Temp:   (!) 97.4 F (36.3 C) 98.3 F (36.8 C)  TempSrc:   Oral Oral  SpO2: 95%     Weight: 80 kg (176 lb 5.9 oz)     Height:        Intake/Output Summary (Last 24 hours) at 04/22/17 1530 Last data filed at 04/22/17 0934  Gross per 24 hour  Intake              243 ml  Output                0 ml  Net              243 ml   Filed Weights   04/20/17 0553 04/21/17 0500 04/22/17 0500  Weight: 81 kg (178 lb 9.2 oz) 86 kg (189 lb 9.5 oz) 80 kg (176 lb 5.9 oz)    Examination: General exam: Appears comfortable  HEENT: PERRLA, oral mucosa moist, no sclera icterus or thrush Respiratory system: Clear to auscultation. Respiratory effort normal. Cardiovascular system: S1 & S2 heard, RRR.  No  murmurs  Gastrointestinal system: Abdomen soft, non-tender, nondistended. Normal bowel sound. No organomegaly Central nervous system: Alert and oriented. No focal neurological deficits. Extremities: No cyanosis, clubbing or edema Skin: No rashes or ulcers Psychiatry:  Mood & affect appropriate.     Data Reviewed: I have personally reviewed following labs and imaging studies  CBC:  Recent Labs Lab 04/16/17 1407 04/18/17 1730 04/21/17 0800  WBC 9.2 8.3 7.2  HGB 8.6* 8.9* 9.4*  HCT 29.6* 30.3* 30.8*  MCV 98.0 98.1 95.1  PLT 399 374 365   Basic Metabolic Panel:  Recent Labs Lab 04/16/17 1408 04/18/17 1729 04/21/17 0800  NA 133* 131* 132*  K 4.8 4.1 3.8  CL 94* 92* 92*  CO2 24 27 26   GLUCOSE 156* 102* 90  BUN 51* 40* 49*  CREATININE 5.00* 4.64* 5.31*  CALCIUM 9.1 9.0 8.9  PHOS 4.8* 4.9* 6.1*   GFR: Estimated Creatinine Clearance: 16.3 mL/min (A) (by C-G formula based on SCr of 5.31 mg/dL (H)). Liver Function Tests:  Recent Labs Lab 04/16/17 1408 04/18/17 1729 04/21/17 0800  ALBUMIN 2.3* 2.3* 2.4*   No results for input(s): LIPASE, AMYLASE in the last 168 hours. No results for input(s): AMMONIA in the last 168 hours. Coagulation Profile: No results for input(s): INR, PROTIME in the last 168 hours. Cardiac Enzymes: No results for input(s): CKTOTAL, CKMB, CKMBINDEX, TROPONINI in the last 168 hours.  BNP (last 3 results) No results for input(s): PROBNP in the last 8760 hours. HbA1C: No results for input(s): HGBA1C in the last 72 hours. CBG:  Recent Labs Lab 04/21/17 1636 04/21/17 1757 04/21/17 2202 04/22/17 0734 04/22/17 1204  GLUCAP 155* 130* 130* 110* 126*   Lipid Profile: No results for input(s): CHOL, HDL, LDLCALC, TRIG, CHOLHDL, LDLDIRECT in the last 72 hours. Thyroid Function Tests: No results for input(s): TSH, T4TOTAL, FREET4, T3FREE, THYROIDAB in the last 72 hours. Anemia Panel: No results for input(s): VITAMINB12, FOLATE, FERRITIN, TIBC,  IRON, RETICCTPCT in the last 72 hours. Urine analysis:    Component Value Date/Time   COLORURINE AMBER (A) 02/14/2017 2111   APPEARANCEUR HAZY (A) 02/14/2017 2111   LABSPEC 1.015 02/14/2017 2111   PHURINE 5.0 02/14/2017 2111   GLUCOSEU NEGATIVE 02/14/2017 2111   HGBUR SMALL (A) 02/14/2017 2111   BILIRUBINUR NEGATIVE 02/14/2017 2111   KETONESUR NEGATIVE 02/14/2017 2111   PROTEINUR 30 (A) 02/14/2017 2111   UROBILINOGEN 0.2 09/21/2014 1908   NITRITE NEGATIVE 02/14/2017 2111   LEUKOCYTESUR TRACE (A) 02/14/2017 2111   Sepsis Labs: @LABRCNTIP (procalcitonin:4,lacticidven:4) )No results found for this or any previous visit (from the past 240 hour(s)).       Radiology Studies: No results found.    Scheduled Meds: . carvedilol  6.25 mg Oral BID WC  . darbepoetin (ARANESP) injection - DIALYSIS  200 mcg Intravenous Q Thu-HD  . dextrose  1 ampule Intravenous Once  . docusate sodium  100 mg Oral BID  . DULoxetine  30 mg Oral Daily  . feeding supplement (PRO-STAT SUGAR FREE 64)  30 mL Oral TID  . [name]'s butt cream   Topical QID  . heparin  5,000 Units Subcutaneous Q8H  . insulin aspart  0-15 Units Subcutaneous TID WC  . insulin glargine  8 Units Subcutaneous Daily  . mouth rinse  15 mL Mouth Rinse BID  . multivitamin  1 tablet Oral QHS  . sodium chloride flush  3 mL Intravenous Q12H   Continuous Infusions: . sodium chloride    . sodium chloride       LOS: 67 days    Time spent in minutes: 35    Calvert Cantor, MD [facility] Pager: [email or web address] Password [email or web address] 04/22/2017, 3:30 PM

## 2017-04-22 NOTE — Progress Notes (Signed)
Patient transferred from 4M to [phone]. VSS. Skin assessed by 2 RNs. Multiple foam dressings to buttocks covering stage II injuries. Dry dressing to L aka site. Abrasions to R knee. Foam dressing to R necrotic heel, no drainage. Patient alert and orient to person and place. Bess Kinds, RN

## 2017-04-22 NOTE — Progress Notes (Signed)
Patient ID: [name], female   DOB: [birth year], 36 y.o.   MRN: [id number]  This NP visited patient at the bedside for follow-up  for palliative needs.  Patient continues to slowly improve.     Plan is SNF for rehab once [name] is able to sit up and be transported  Placed calll to sister/[name] # [phone]  in [place] to offer update and support.  She understands the current medical situation and shares the struggles she and her family have been through "dealing with this whole situation, there is always something right around the corner".   I discussed with her my concern that the patient is high risk for decompensation, and the  Discussed  the importance of continued conversation with family and their  medical providers regarding overall plan of care and treatment options,  ensuring decisions are within the context of the patients values and GOCs.  Recommend palliative services once she is discharge.    Questions and concerns addressed.    Time in   1700       Time out  1725   Total time spent on the unit was 25 min    Greater than 50% of the time was spent in counseling and coordination of care  [name] NP  Palliative Medicine Team Team Phone # [phone] Pager [phone]         No questions and concerns today.  PMT will continue to support holistically.  I will f/u on Monday if still hospitalized.  Family knows to call with questions or concerns.   Time in    Time out        Total time spent on the unit was   [name] NP  Palliative Medicine Team Team Phone # [phone] Pager [phone]

## 2017-04-22 NOTE — Progress Notes (Signed)
Patient to transfer to 6E-03. Report called to [facility]

## 2017-04-23 DIAGNOSIS — R531 Weakness: Secondary | ICD-10-CM

## 2017-04-23 LAB — RENAL FUNCTION PANEL
ANION GAP: 11 (ref 5–15)
Albumin: 2.4 g/dL — ABNORMAL LOW (ref 3.5–5.0)
BUN: 45 mg/dL — ABNORMAL HIGH (ref 6–20)
CALCIUM: 8.9 mg/dL (ref 8.9–10.3)
CHLORIDE: 94 mmol/L — AB (ref 101–111)
CO2: 26 mmol/L (ref 22–32)
CREATININE: 5.16 mg/dL — AB (ref 0.44–1.00)
GFR, EST AFRICAN AMERICAN: 11 mL/min — AB (ref >60)
GFR, EST NON AFRICAN AMERICAN: 10 mL/min — AB (ref >60)
Glucose, Bld: 152 mg/dL — ABNORMAL HIGH (ref 65–99)
Phosphorus: 5.8 mg/dL — ABNORMAL HIGH (ref 2.5–4.6)
Potassium: 4.2 mmol/L (ref 3.5–5.1)
Sodium: 131 mmol/L — ABNORMAL LOW (ref 135–145)

## 2017-04-23 LAB — CBC
HCT: 30.7 % — ABNORMAL LOW (ref 36.0–46.0)
HEMOGLOBIN: 9.4 g/dL — AB (ref 12.0–15.0)
MCH: 29.1 pg (ref 26.0–34.0)
MCHC: 30.6 g/dL (ref 30.0–36.0)
MCV: 95 fL (ref 78.0–100.0)
PLATELETS: 326 10*3/uL (ref 150–400)
RBC: 3.23 MIL/uL — AB (ref 3.87–5.11)
RDW: 16.8 % — ABNORMAL HIGH (ref 11.5–15.5)
WBC: 7.9 10*3/uL (ref 4.0–10.5)

## 2017-04-23 LAB — GLUCOSE, CAPILLARY
GLUCOSE-CAPILLARY: 115 mg/dL — AB (ref 65–99)
GLUCOSE-CAPILLARY: 90 mg/dL (ref 65–99)
Glucose-Capillary: 97 mg/dL (ref 65–99)

## 2017-04-23 MED ORDER — LIDOCAINE HCL (PF) 1 % IJ SOLN: 5.0000 mL | INTRAMUSCULAR | Status: DC | PRN

## 2017-04-23 MED ORDER — DARBEPOETIN ALFA 200 MCG/0.4ML IJ SOSY
PREFILLED_SYRINGE | INTRAMUSCULAR | Status: AC
  Administered 2017-04-23: 200 ug via INTRAVENOUS
  Filled 2017-04-23: qty 0.4

## 2017-04-23 MED ORDER — ALTEPLASE 2 MG IJ SOLR: 2.0000 mg | Freq: Once | INTRAMUSCULAR | Status: DC | PRN

## 2017-04-23 MED ORDER — LIDOCAINE-PRILOCAINE 2.5-2.5 % EX CREA: 1.0000 "application " | TOPICAL_CREAM | CUTANEOUS | Status: DC | PRN

## 2017-04-23 MED ORDER — PENTAFLUOROPROP-TETRAFLUOROETH EX AERO: 1.0000 "application " | INHALATION_SPRAY | CUTANEOUS | Status: DC | PRN

## 2017-04-23 MED ORDER — SODIUM CHLORIDE 0.9 % IV SOLN: 100.0000 mL | INTRAVENOUS | Status: DC | PRN

## 2017-04-23 NOTE — Progress Notes (Signed)
Dialysis treatment completed.  500 mL ultrafiltrated and net fluid removal 0 mL.    Patient status unchanged. Lung sounds diminished to ausculation in all fields. Generalized non pitting edema. Cardiac: ST.  Disconnected lines and removed needles.  Pressure held for 10 minutes and band aid/gauze dressing applied.  Report given to bedside RN, Kirstin.

## 2017-04-23 NOTE — Progress Notes (Signed)
Physical Therapy Treatment Patient Details Name: [name] MRN: [id number] DOB: 1981/02/23 Today's Date: 04/23/2017    History of Present Illness [name] is a 36 y.o. 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 ETT 5/19 and trach'd 5/31. Trach removed 7/16.    PT Comments    Pt was seen for supine to sitting BP measures as follow:  Supine 152/93 with pulse 111, sitting supported on trunk was 126/78 with pulse 111.  Pt is requiring continual support on bed for balance as she leans to either side with no warning.  Pt was able to be assisted to scoot up the bed with CNA and PT after challenging her with sitting bathing with CNA.  Pt is tired and needed mod assist to lean back to supine to finish bath with CNA only.  Will continue acutely to work on assessing her vitals and looking at her control of mobility, and try to find a suitable chair for her to sit for HD safely without sudden LOB laterally or forward.     Follow Up Recommendations  SNF     Equipment Recommendations  Other (comment)    Recommendations for Other Services       Precautions / Restrictions Precautions Precautions: Fall Restrictions Weight Bearing Restrictions: Yes RLE Weight Bearing: Non weight bearing LLE Weight Bearing: Non weight bearing Other Position/Activity Restrictions: PRAFO boot on RLE    Mobility  Bed Mobility Overal bed mobility: Needs Assistance Bed Mobility: Rolling;Supine to Sit;Sit to Supine Rolling: Min assist (pt assists with bed rails)   Supine to sit: Mod assist Sit to supine: Mod assist   General bed mobility comments: cues for all steps, pt tends to ignore her RLE with mobility and  needed reminding to bring it up to the bed for work on scooting backward  Transfers Overall transfer level: Needs assistance Equipment used: 2 person hand held assist Transfers: Licensed conveyancer transfers: +2 physical assistance;+2 safety/equipment   General transfer comment: practiced on bed surface, after noting the BP readings were WFL both supine and sitting  Ambulation/Gait             General Gait Details: NWB on BLE's   Stairs            Wheelchair Mobility    Modified Rankin (Stroke Patients Only)       Balance     Sitting balance-Leahy Scale: Poor Sitting balance - Comments: pt lists in all directions with no control exerted once the balance changes Postural control: Posterior lean;Right lateral lean;Left lateral lean                                  Cognition Arousal/Alertness: Lethargic Behavior During Therapy: Flat affect;WFL for tasks assessed/performed Overall Cognitive Status: Impaired/Different from baseline Area of Impairment: Attention;Following commands;Safety/judgement;Awareness;Problem solving;Memory                 Orientation Level: Situation Current Attention Level: Sustained Memory: Decreased recall of precautions;Decreased short-term memory Following Commands: Follows one step commands with increased time Safety/Judgement: Decreased awareness of deficits;Decreased awareness  of safety Awareness: Intellectual Problem Solving: Slow processing;Decreased initiation;Requires verbal cues;Requires tactile cues General Comments: pt is following instructions with PT with some brief time processing      Exercises      General Comments        Pertinent Vitals/Pain Pain Assessment: No/denies pain    Home Living                      Prior Function            PT Goals (current goals can now be found in the care plan section) Acute Rehab PT Goals Patient Stated  Goal: to feel better, less tired Progress towards PT goals: Not progressing toward goals - comment (still struggling with sitting balance control but will work )    Frequency    Min 3X/week      PT Plan Current plan remains appropriate    Co-evaluation              AM-PAC PT "6 Clicks" Daily Activity  Outcome Measure  Difficulty turning over in bed (including adjusting bedclothes, sheets and blankets)?: Total Difficulty moving from lying on back to sitting on the side of the bed? : Total Difficulty sitting down on and standing up from a chair with arms (e.g., wheelchair, bedside commode, etc,.)?: Total Help needed moving to and from a bed to chair (including a wheelchair)?: Total Help needed walking in hospital room?: Total Help needed climbing 3-5 steps with a railing? : Total 6 Click Score: 6    End of Session Equipment Utilized During Treatment: Other (comment) (PRAFO RLE) Activity Tolerance: Patient limited by fatigue;Other (comment) (complaints of dizziness) Patient left: in bed;with call bell/phone within reach;with family/visitor present;with nursing/sitter in room Nurse Communication: Mobility status PT Visit Diagnosis: Muscle weakness (generalized) (M62.81);Other abnormalities of gait and mobility (R26.89)     Time: 2712-9290 PT Time Calculation (min) (ACUTE ONLY): 26 min  Charges:  $Therapeutic Activity: 8-22 mins $Neuromuscular Re-education: 8-22 mins                    G Codes:  Functional Assessment Tool Used: AM-PAC 6 Clicks Basic Mobility     Ivar Drape 04/23/2017, 1:14 PM   Samul Dada, PT MS Acute Rehab Dept. Number: [facility] [id number] and [facility] [phone]

## 2017-04-23 NOTE — Progress Notes (Signed)
Patient arrived to unit per HD chair.  Reviewed treatment plan and this RN agrees.  Report received from bedside RN, Mathis Fare.  Consent verified.  Patient A & O X 3, forgetful. Lung sounds diminished to ausculation in all fields. No edema. Cardiac: NSR.  Prepped LUAVF with alcohol and cannulated with two 15 gauge needles.  Pulsation of blood noted.  Flushed access well with saline per protocol.  Connected and secured lines and initiated tx at 1520.  UF goal of 500 mL and net fluid removal of 0 mL.  Will continue to monitor.

## 2017-04-23 NOTE — Progress Notes (Signed)
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   Admitted on 02/14/17 to [facility] with left foot/leg pain, wound dehiscence and purulent drainage from the left trans-metatarsal amputation site.  She was subsequently found to be unresponsive on 5/19. Noted to bein asystole and CPR started with ROSC in approximately 9 minutes. She was intubated by anaesthesia, transferred to the intensive care unit and subsequently underwent cooling protocol. She couldn't be weaned off the ventilator and as a result underwent tracheostomy on 5/31. She underwent decannulation on 7/16.  Neurological/ Cognitive status has been poor. She has been evaluated by neurology on 5/29 and, per neurology, chances of meaningful neurological recovery appeared to be dismal.  Currently she is able to follow simple commands and is alert and is eating.  Hospital course complicated by persistent leukocytosis, respiratory failure and left transmetatarsal wound dehiscence with gangrene. 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  Over the course of her hospitalizations, she has been treated for multiple episodes of recurrent aspiration pneumonias.  Currently off antibiotics and awaiting placement.  The patient is unable to sit in a normal wheel chair to be transported to the HD due to poor truncal stability. She cannot be discharged until she is able to sit in a normal wheel chair(without sliding down) for transport to HD.    Subjective: No complaints. ROS: no complaints of nausea, vomiting, constipation diarrhea, cough, dyspnea or dysuria. No other complaints.   Assessment & Plan:  Acute /chronichypoxemic vent dependent respiratory failure in a setting of cardiac arrest Aspiration pneumonia- RLL  Tracheostomy  - Intubated 6/19-6/20.  5/31- tracheostomy- - PCCM following  - S/P MBS 6/23. Now status post FEES 6/27. Patient started on nectar thick liquids and dysphagia 1 diet 6/27. Holding off on PEG   - has had aspiration issues- recurrent aspiration on 6/28-  Repeat chest x-ray on 6/29 showed new right-sided infiltrate - She was started on Fortaz on 6/29 and changed over to Zosyn on 6/30   - Chest x-ray from 7/1 showed improving opacity in the right lung. 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  - Orthopedics removed wound VAC 6/20 - revision of her left AKA  04/03/17  with reapplication of wound vac which was subsequently removed. -  wound remains clean   Right heel eschar - wound care RN following - Prevalon boot  Ulcers on sacral area- incontinence associated dermatitis - WOC >> Cleanse and apply Purple Top [employer] Cream BID and PRN incontinence of urine or stool - also applying barrier cream - air mattress    Diabetes mellitus 2 - cont Lantus 8 U and SSI with meals  Thrush - resolved with treatment with Diflucan and Nystatin  Anoxic brain injury secondary to Cardiac arrest on 5/19: - slow cognitive improvement- following commands, talking & feeding herself now  Diarrhea  - resolved    Normocytic anemia Likely  secondary to chronic disease-probably worsened by acute illness. No signs of bleeding, hemoglobin has been stable with some fluctuations.   Diabetes mellitus type 2  Continue with SSI. On Lantus. HbA1c 5.9.   Chronic systolic heart failure/nonischemic cardiomyopathy - Echocardiogram>> EF 25-30% with diffuse hypokinesis - volume management with dialysis - has been unable to tolerate Coreg due to hypotension  - resumed as BP rising  Hypotension/ HTN - Midodrine with dialysis to be discontinued today due to resolution of hypotension - using Coreg now    Pericardial effusion Seen on TEE- no evidence of tamponade pathophysiology. Repeat echo 6/26 with no significant pericardial effusion.   Moderate protein calorie malnutrition - taking orals well now- cont Prostat  Depression?  - has been on Cymbalta at home which was being held-   resumed on 7/13  Dysphagia  - D 2 diet with nectar thick liquids     DVT prophylaxis: Heparin Code Status: Full code Family Communication: mother Disposition Plan: SNF  Consultants:   PCCM    Nephrology  Palliative care consult   Orthopedics,  Procedures:  Lower extremity ultrasound 5/18 >> no evidence of DVT CT head 5/19 >> normal exam Echo 5/20 >>EF 30-35%, increased LVF. Diffuse hypokinesis, akinesis of basilar mid-inferior myocardium, grade 2 diastolic dysfxn  EEG 5/20 >>finding c/w mod to severe global cerebral dysfxn. C/w anoxic injury given clinical course  LE [place] 5/21 >> negative MRI brain 5/24 >> ?thrombosed cortical vein, otherwise normal TEE 6/4 >>mild MR, normal AV, mild TR, mild PR, EF 30-35%, diffuse hypokinesis, no thrombus, no PFO, normal RV, moderate pericardial effusion with synechia suggesting some chronicity, no vegetations   Antimicrobials:  Anti-infectives    Start     Dose/Rate Route Frequency Ordered Stop   04/13/17 1000  fluconazole (DIFLUCAN) tablet 100 mg     100 mg Oral Daily 04/12/17 1356 04/15/17  0959   04/08/17 1000  fluconazole (DIFLUCAN) IVPB 100 mg  Status:  Discontinued     100 mg 50 mL/hr over 60 Minutes Intravenous Every 24 hours 04/08/17 0851 04/12/17 1356   04/04/17 0600  ceFAZolin (ANCEF) IVPB 2g/100 mL premix  Status:  Discontinued     2 g 200 mL/hr over 30 Minutes Intravenous On call to O.R. 04/03/17 1840 04/03/17 1848   03/28/17 1600  cefTAZidime (FORTAZ) 1 g in dextrose 5 % 50 mL IVPB  Status:  Discontinued     1 g 100 mL/hr over 30 Minutes Intravenous Every 24 hours 03/27/17 0814 03/28/17 0752   03/28/17 1100  piperacillin-tazobactam (ZOSYN) IVPB 3.375 g     3.375 g 12.5 mL/hr over 240 Minutes Intravenous Every 12 hours 03/28/17 1038 04/11/17 1359   03/27/17 0815  cefTAZidime (FORTAZ) 1 g in dextrose 5 % 50 mL IVPB     1 g 100 mL/hr over 30 Minutes Intravenous  Once 03/27/17 0814 03/27/17 1026   03/17/17 1800  cefTAZidime (FORTAZ) 2 g in dextrose 5 % 50 mL IVPB  Status:  Discontinued     2 g 100 mL/hr over 30 Minutes Intravenous Every T-Th-Sa (1800) 03/15/17 1143 03/18/17 1223   03/17/17 1200  vancomycin (VANCOCIN) IVPB 1000 mg/200 mL premix  Status:  Discontinued     1,000 mg 200 mL/hr over 60  Minutes Intravenous Every T-Th-Sa (Hemodialysis) 03/15/17 1143 03/18/17 1223   03/14/17 1200  vancomycin (VANCOCIN) IVPB 1000 mg/200 mL premix  Status:  Discontinued     1,000 mg 200 mL/hr over 60 Minutes Intravenous Every T-Th-Sa (Hemodialysis) 03/12/17 0912 03/14/17 1542   03/13/17 1430  ceFAZolin (ANCEF) IVPB 2g/100 mL premix  Status:  Discontinued     2 g 200 mL/hr over 30 Minutes Intravenous To ShortStay Surgical 03/12/17 1208 03/13/17 1113   03/13/17 1115  ceFAZolin (ANCEF) IVPB 2g/100 mL premix     2 g 200 mL/hr over 30 Minutes Intravenous To Surgery 03/13/17 1108 03/13/17 1506   03/12/17 1800  cefTAZidime (FORTAZ) 2 g in dextrose 5 % 50 mL IVPB  Status:  Discontinued     2 g 100 mL/hr over 30 Minutes Intravenous Every T-Th-Sa (1800) 03/12/17 0912 03/14/17 1542    03/12/17 1200  vancomycin (VANCOCIN) 1,500 mg in sodium chloride 0.9 % 250 mL IVPB     1,500 mg 250 mL/hr over 60 Minutes Intravenous Every Thu (Hemodialysis) 03/12/17 0912 03/12/17 1356   03/02/17 1200  cefTRIAXone (ROCEPHIN) 2 g in dextrose 5 % 50 mL IVPB     2 g 100 mL/hr over 30 Minutes Intravenous Every 24 hours 03/02/17 0810 03/06/17 1358   03/02/17 1200  vancomycin (VANCOCIN) IVPB 1000 mg/200 mL premix     1,000 mg 200 mL/hr over 60 Minutes Intravenous Every M-W-F (Hemodialysis) 03/02/17 0815 03/02/17 1457   03/02/17 1200  metroNIDAZOLE (FLAGYL) IVPB 500 mg     500 mg 100 mL/hr over 60 Minutes Intravenous Every 8 hours 03/02/17 0948 03/06/17 2230   02/26/17 1200  vancomycin (VANCOCIN) IVPB 1000 mg/200 mL premix     1,000 mg 200 mL/hr over 60 Minutes Intravenous Every T-Th-Sa (Hemodialysis) 02/25/17 1151 03/05/17 1325   02/25/17 1200  vancomycin (VANCOCIN) 2,000 mg in sodium chloride 0.9 % 500 mL IVPB     2,000 mg 250 mL/hr over 120 Minutes Intravenous  Once 02/25/17 1149 02/25/17 1449   02/25/17 0900  cefTRIAXone (ROCEPHIN) 2 g in dextrose 5 % 50 mL IVPB  Status:  Discontinued     2 g 100 mL/hr over 30 Minutes Intravenous Every 24 hours 02/25/17 0814 03/02/17 0810   02/25/17 0900  metroNIDAZOLE (FLAGYL) IVPB 500 mg  Status:  Discontinued     500 mg 100 mL/hr over 60 Minutes Intravenous Every 8 hours 02/25/17 0814 03/02/17 0948   02/20/17 1200  vancomycin (VANCOCIN) IVPB 1000 mg/200 mL premix  Status:  Discontinued     1,000 mg 200 mL/hr over 60 Minutes Intravenous Every M-W-F (Hemodialysis) 02/19/17 1506 02/20/17 1403   02/17/17 1800  meropenem (MERREM) 500 mg in sodium chloride 0.9 % 50 mL IVPB  Status:  Discontinued     500 mg 100 mL/hr over 30 Minutes Intravenous Daily-1800 02/17/17 1026 02/25/17 0814   02/17/17 1200  vancomycin (VANCOCIN) IVPB 1000 mg/200 mL premix     1,000 mg 200 mL/hr over 60 Minutes Intravenous Every T-Th-Sa (Hemodialysis) 02/14/17 1758 02/19/17 1612    02/14/17 2200  clindamycin (CLEOCIN) IVPB 900 mg  Status:  Discontinued     900 mg 100 mL/hr over 30 Minutes Intravenous Every 8 hours 02/14/17 1106 02/14/17 1928   02/14/17 2000  clindamycin (CLEOCIN) IVPB 900 mg  Status:  Discontinued     900 mg 100 mL/hr over 30 Minutes Intravenous Every 8 hours 02/14/17 1928 02/16/17 1047   02/14/17 1915  piperacillin-tazobactam (ZOSYN) IVPB 3.375 g  Status:  Discontinued     3.375 g 100 mL/hr over 30 Minutes Intravenous Every 12 hours 02/14/17 1801 02/17/17 1026   02/14/17 1845  vancomycin (VANCOCIN) 2,000 mg in sodium chloride 0.9 % 500 mL IVPB     2,000 mg 250 mL/hr over 120 Minutes Intravenous  Once 02/14/17 1758 02/14/17 2107   02/14/17 1115  clindamycin (CLEOCIN) IVPB 900 mg  Status:  Discontinued     900 mg 100 mL/hr over 30 Minutes Intravenous  Once 02/14/17 1106 02/14/17 2236   02/14/17 0945  clindamycin (CLEOCIN) IVPB 600 mg     600 mg 100 mL/hr over 30 Minutes Intravenous  Once 02/14/17 0931 02/14/17 1013       Objective: Vitals:   04/23/17 1520 04/23/17 1530 04/23/17 1600 04/23/17 1630  BP: 121/65 122/77 113/75 110/70  Pulse: 100 (!) 103 (!) 107 (!) 110  Resp:      Temp:      TempSrc:      SpO2:      Weight:      Height:        Intake/Output Summary (Last 24 hours) at 04/23/17 1655 Last data filed at 04/23/17 0600  Gross per 24 hour  Intake               60 ml  Output                0 ml  Net               60 ml   Filed Weights   04/22/17 0500 04/22/17 2115 04/23/17 1509  Weight: 80 kg (176 lb 5.9 oz) 77 kg (169 lb 12.1 oz) 77 kg (169 lb 12.1 oz)    Examination: General exam: Appears comfortable -  HEENT: PERRLA, oral mucosa moist Respiratory system: Clear to auscultation. Respiratory effort normal. Cardiovascular system: S1 & S2 heard, RRR.  No murmurs, tachycardic Gastrointestinal system: Abdomen soft, non-tender, nondistended. Normal bowel sound. No organomegaly Central nervous system: Alert - moving arms and  legs - normal tone in extermities Extremities: No cyanosis, clubbing or edema- left AKA  - incision is clean Skin:  - right heel eschar Psych: flat affect, poor communication    Data Reviewed: I have personally reviewed following labs and imaging studies  CBC:  Recent Labs Lab 04/18/17 1730 04/21/17 0800 04/23/17 1554  WBC 8.3 7.2 7.9  HGB 8.9* 9.4* 9.4*  HCT 30.3* 30.8* 30.7*  MCV 98.1 95.1 95.0  PLT 374 365 326   Basic Metabolic Panel:  Recent Labs Lab 04/18/17 1729 04/21/17 0800 04/23/17 1554  NA 131* 132* 131*  K 4.1 3.8 4.2  CL 92* 92* 94*  CO2 27 26 26   GLUCOSE 102* 90 152*  BUN 40* 49* 45*  CREATININE 4.64* 5.31* 5.16*  CALCIUM 9.0 8.9 8.9  PHOS 4.9* 6.1* 5.8*   GFR: Estimated Creatinine Clearance: 15.3 mL/min (A) (by C-G formula based on SCr of 5.16 mg/dL (H)). Liver Function Tests:  Recent Labs Lab 04/18/17 1729 04/21/17 0800 04/23/17 1554  ALBUMIN 2.3* 2.4* 2.4*   No results for input(s): LIPASE, AMYLASE in the last 168 hours. No results for input(s): AMMONIA in the last 168 hours. Coagulation Profile: No results for input(s): INR, PROTIME in the last 168 hours. Cardiac Enzymes: No results for input(s): CKTOTAL, CKMB, CKMBINDEX, TROPONINI in the last 168 hours. BNP (last 3 results) No results for input(s): PROBNP in the last 8760 hours. HbA1C: No results for input(s): HGBA1C in the last 72  hours. CBG:  Recent Labs Lab 04/22/17 1704 04/22/17 1851 04/22/17 2111 04/23/17 0734 04/23/17 1224  GLUCAP 122* 152* 151* 97 115*   Lipid Profile: No results for input(s): CHOL, HDL, LDLCALC, TRIG, CHOLHDL, LDLDIRECT in the last 72 hours. Thyroid Function Tests: No results for input(s): TSH, T4TOTAL, FREET4, T3FREE, THYROIDAB in the last 72 hours. Anemia Panel: No results for input(s): VITAMINB12, FOLATE, FERRITIN, TIBC, IRON, RETICCTPCT in the last 72 hours. Urine analysis:    Component Value Date/Time   COLORURINE AMBER (A) 02/14/2017 2111     APPEARANCEUR HAZY (A) 02/14/2017 2111   LABSPEC 1.015 02/14/2017 2111   PHURINE 5.0 02/14/2017 2111   GLUCOSEU NEGATIVE 02/14/2017 2111   HGBUR SMALL (A) 02/14/2017 2111   BILIRUBINUR NEGATIVE 02/14/2017 2111   KETONESUR NEGATIVE 02/14/2017 2111   PROTEINUR 30 (A) 02/14/2017 2111   UROBILINOGEN 0.2 09/21/2014 1908   NITRITE NEGATIVE 02/14/2017 2111   LEUKOCYTESUR TRACE (A) 02/14/2017 2111   Sepsis Labs: @LABRCNTIP (procalcitonin:4,lacticidven:4) )No results found for this or any previous visit (from the past 240 hour(s)).       Radiology Studies: No results found.    Scheduled Meds: . carvedilol  6.25 mg Oral BID WC  . darbepoetin (ARANESP) injection - DIALYSIS  200 mcg Intravenous Q Thu-HD  . dextrose  1 ampule Intravenous Once  . docusate sodium  100 mg Oral BID  . DULoxetine  30 mg Oral Daily  . feeding supplement (PRO-STAT SUGAR FREE 64)  30 mL Oral TID  . [name]'s butt cream   Topical QID  . heparin  5,000 Units Subcutaneous Q8H  . insulin aspart  0-15 Units Subcutaneous TID WC  . insulin glargine  8 Units Subcutaneous Daily  . mouth rinse  15 mL Mouth Rinse BID  . multivitamin  1 tablet Oral QHS  . sodium chloride flush  3 mL Intravenous Q12H   Continuous Infusions: . sodium chloride    . sodium chloride    . sodium chloride    . sodium chloride       LOS: 68 days    Time spent in minutes: 35    Calvert Cantor, MD [facility] Pager: [email or web address] Password [email or web address] 04/23/2017, 4:55 PM

## 2017-04-23 NOTE — Clinical Social Work Note (Signed)
[name] talked with patient's mother at the bedside this morning regarding discharge disposition, facility selection, and patient being able to sit for dialysis. [name] informed that [name] is preferred facility. [name] also informed [name] that her daughter has gone to dialysis a few times in the chair already. Call made to [facility], admissions directors with [name] regarding patient and [name] informed that they will be unable to accept patient as she cannot sit in a wheelchair to be transported to dialysis and other transport options, would be more expensive.   Today's nephrology note reviewed and per [name], patient "Has not been able to sit in a wheelchair yet, slides out when attempting". [name] consulted with nurse case manager, [name] regarding this concern and possible solutions, including having patient sit in wheelchair in room by nursing and possible assistance by PT with this issue. Nurse case manager talked with patient's nurse, [name] and charge nurse regarding this concern. [name] will continue to follow and assist as needed with discharge planning and also following up regarding concerns with patient being able to be transported to and from dialysis in a wheelchair.  Genelle Bal, MSW, LCSW Licensed Clinical Social Worker Clinical Social Work Department [employer] [phone]

## 2017-04-23 NOTE — Progress Notes (Addendum)
[facility] Rounding Note  Subjective:  No new issues.  Has not been able to sit in a wheelchair yet, slides out when attempting.   Vital signs in last 24 hours: Vitals:   04/22/17 1815 04/22/17 2115 04/23/17 0557 04/23/17 0910  BP: 132/77 (!) 143/93 (!) 150/96 (!) 161/93  Pulse:  (!) 103 (!) 109 (!) 110  Resp: (!) 24 20 20 18   Temp: 98.2 F (36.8 C) 98.9 F (37.2 C) 97.8 F (36.6 C) 97.7 F (36.5 C)  TempSrc: Oral Oral Oral Oral  SpO2:  97% 99%   Weight:  77 kg (169 lb 12.1 oz)    Height:       Weight change: -3 kg (-6 lb 9.8 oz)  Intake/Output Summary (Last 24 hours) at 04/23/17 1249 Last data filed at 04/23/17 0600  Gross per 24 hour  Intake               60 ml  Output                0 ml  Net               60 ml   Physical Exam: GEN Alert, NAD HEENT: prior trach site healing PULM: normal WOB, clear bilaterally CV tachycardic  ABD obese, soft and non tender EXT: Left AKA dressed, no edema, Right foot in protective boot Dialysis Access: Left AVF + bruit and thrill  Dialysis: TTS [facility] 4h   F200  114kg   2/2 bath  LUA AVF  Hep none - Iron Sucrose (Venofer) 50 mg IVP During Dialysis 1X Week  - Mircera 75 mcg IV q  2 weeks ( last on 02/05/17) - Vitamin D (Calcitriol) Oral 0.75 mcg  Po q hd   Summary: 36 year old female w/ ESRD d/t poorly controlled DM, HD bilateral TMA's, obesity, HTN, medical non-compliance, NICM. Admitted 5/19 w/left leg pain, + radiological evidence of osteomyelitis w/wound nonhealing, dehiscence, purulent drainage. Admitted for IV antibiotics, hemodialysis and possible surgery. Found unresponsive in her room that afternoon - cardiac arrest > rec'd  CPR, epi bicarb; intubated; cooled. Then developed anoxic brain injury, trach and s/p L AKA this hosp. Course further complicated by recurrent aspiration PNA, dehiscence of AK wound requiring revision 7/6.  Now decannulated, ABI improving, and pt stable and ready for discharge pending choosing a SNF.    Assessment/ Plan: 1. Anoxic brain injury: s/p cardiac arrest. Better, not less talkative lately. 2. Resp failure, sp trach: decannulated 7/16. 3. ESRD: TTS HD.  Continue normal schedule.  No heparin 4. Debility: cannot sit up by herself and can't sit in a WC. Not able to dc due to inherent OP HD in-center and transportation requirements she cannot meet w/ her significant physical disabilities.  She is not ready for OP dialysis physically and not sure if/ when she will be.   I have d/w PT, primary MD and SW on 6E.   5. HTN/ volume: On low-dose coreg.  Required midodrine before, now d/c'd.  EDW appears to be ~ 80 kg. 6. Anemia: Darbopoetin 200 q week.  Repeat iron panel with Tsat 36% 7/19, no need for IV iron 7. Bone/ Mineral:  Renal diet, no binder, calcitriol d/c'd due to increasing Ca.  PTH 88 here.  If still here next week, need to repeat PTH. 8. 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  S/p woundvac, now being dressed. 9. DM 2:  per primary 10. Chronic systolic CHF:  EF 25-30%; improving BP, on BB 11. Nutrition: albumin 2.2.  Taking prostat.   Vinson Moselle MD [employer] pgr [phone]   04/23/2017, 12:49 PM         Recent Labs Lab 04/16/17 1408 04/18/17 1729 04/21/17 0800  NA 133* 131* 132*  K 4.8 4.1 3.8  CL 94* 92* 92*  CO2 24 27 26   GLUCOSE 156* 102* 90  BUN 51* 40* 49*  CREATININE 5.00* 4.64* 5.31*  CALCIUM 9.1 9.0 8.9  PHOS 4.8* 4.9* 6.1*    Recent Labs Lab 04/16/17 1408 04/18/17 1729 04/21/17 0800  ALBUMIN 2.3* 2.3* 2.4*    Recent Labs Lab 04/16/17 1407 04/18/17 1730 04/21/17 0800  WBC 9.2 8.3 7.2  HGB 8.6* 8.9* 9.4*  HCT 29.6* 30.3* 30.8*  MCV 98.0 98.1 95.1  PLT 399 374 365     Recent Labs Lab 04/22/17 1704 04/22/17 1851 04/22/17 2111 04/23/17 0734 04/23/17 1224  GLUCAP 122* 152* 151* 97 115*    Medications: Infusions: . sodium chloride    . sodium chloride      Scheduled Medications: . carvedilol   6.25 mg Oral BID WC  . darbepoetin (ARANESP) injection - DIALYSIS  200 mcg Intravenous Q Thu-HD  . dextrose  1 ampule Intravenous Once  . docusate sodium  100 mg Oral BID  . DULoxetine  30 mg Oral Daily  . feeding supplement (PRO-STAT SUGAR FREE 64)  30 mL Oral TID  . [name]'s butt cream   Topical QID  . heparin  5,000 Units Subcutaneous Q8H  . insulin aspart  0-15 Units Subcutaneous TID WC  . insulin glargine  8 Units Subcutaneous Daily  . mouth rinse  15 mL Mouth Rinse BID  . multivitamin  1 tablet Oral QHS  . sodium chloride flush  3 mL Intravenous Q12H

## 2017-04-24 LAB — GLUCOSE, CAPILLARY
GLUCOSE-CAPILLARY: 149 mg/dL — AB (ref 65–99)
Glucose-Capillary: 121 mg/dL — ABNORMAL HIGH (ref 65–99)
Glucose-Capillary: 144 mg/dL — ABNORMAL HIGH (ref 65–99)
Glucose-Capillary: 129 mg/dL — ABNORMAL HIGH (ref 65–99)

## 2017-04-24 MED ORDER — SEVELAMER CARBONATE 800 MG PO TABS
1600.0000 mg | ORAL_TABLET | Freq: Three times a day (TID) | ORAL | Status: DC
  Filled 2017-04-24: qty 2

## 2017-04-24 MED ORDER — SEVELAMER CARBONATE 0.8 G PO PACK
1.6000 g | PACK | Freq: Three times a day (TID) | ORAL | Status: DC
  Administered 2017-04-24 – 2017-04-30 (×15): 1.6 g via ORAL
  Filled 2017-04-24 (×20): qty 2

## 2017-04-24 NOTE — Care Management Note (Signed)
Case Management Note  Patient Details  Name: [name] MRN: [id number] Date of Birth: 1981-06-02  Subjective/Objective:     CM following for progression and d/c planning.                Action/Plan: 04/24/2017 Pt has been able to sit in recliner for HD treatment, however will need to be able to sit in a wheelchair for transport to the HD treatment in the community once d/c to a SNF. Per staff pt is unable to control her arms and legs and is unable to assist with sitting upright. Continues to fall forward and to the side when in an upright sitting position. Pt will have to be able to sit in a wheelchair for transport to HD in order to d/c. Staff will work with pt  In effort to strengthen for sitting.   Expected Discharge Date:  07;31;2018              Expected Discharge Plan:  Skilled Nursing Facility  In-House Referral:  Clinical Social Work  Discharge planning Services  CM Consult  Post Acute Care Choice:    Choice offered to:     DME Arranged:    DME Agency:     HH Arranged:    HH Agency:     Status of Service:  Completed, signed off  If discussed at [employer], dates discussed:    Additional Comments:  Starlyn Skeans, RN 04/24/2017, 3:18 PM

## 2017-04-24 NOTE — Care Management Note (Signed)
Case Management Note  Patient Details  Name: Tieshia Rackard MRN: [id number] Date of Birth: [birth year]  Subjective/Objective:     CM following for progression and d/c planning.                Action/Plan: 04/24/2017 Staff working with pt to maintain balance in wheelchair. Per staff , pt was hoyer lifted into the wheelchair and was able to sit in the wheelchair x2hr and ate lunch in the wheelchair. CSW V Crawford informed, as this abillity to sit upright in the wheelchair will impact her ability to d/c to the SNF and transport to an outpatient HD center.   Expected Discharge Date:  04/28/2017              Expected Discharge Plan:  Skilled Nursing Facility  In-House Referral:  Clinical Social Work  Discharge planning Services  CM Consult  Post Acute Care Choice:    Choice offered to:     DME Arranged:    DME Agency:     HH Arranged:    HH Agency:     Status of Service:  Completed, signed off  If discussed at [employer], dates discussed:    Additional Comments:  Starlyn Skeans, RN 04/24/2017, 4:09 PM

## 2017-04-24 NOTE — Progress Notes (Signed)
Subjective:  Tolerated HD in [facility] yest.  Mother in Room  Discussed with her  Need for Pt to be up in Wheelchair with out problems before dc   Objective Vital signs in last 24 hours: Vitals:   04/23/17 1953 04/23/17 2025 04/24/17 0444 04/24/17 0730  BP: (!) 147/87  140/85 (!) 143/87  Pulse: (!) 114  97 (!) 115  Resp: 20  16 18   Temp: (!) 97.3 F (36.3 C)  98.8 F (37.1 C) 99.3 F (37.4 C)  TempSrc: Oral   Oral  SpO2: 100%  94% 99%  Weight:  76 kg (167 lb 8.8 oz)    Height:       Weight change: 0 kg (0 lb)  Physical Exam: GEN  Awoken from sleep ,Alert, NAD HEENT: prior trach site healing PULM: normal WOB, clear bilaterally Card - Reg . tachycardic  ABD obese, soft and non tender/ NT  EXT: Left AKA dressed, trace edema L thigh and R pedal with  Right foot in protective boot Dialysis Access: Left AVF + bruit and thrill  Dialysis: TTS [facility] 4h   F200  114kg  ( NOW  In hosp EDW ~~80) 2/2 bath  LUA AVF  Hep none - Iron Sucrose (Venofer) 50 mg IVP During Dialysis 1X Week  - Mircera 75 mcg IV q 2 weeks ( last on 02/05/17) - Vitamin D (Calcitriol) Oral0.75 mcg Po q hd   Summary: 36 year old female w/ ESRD d/t poorly controlled DM, HD bilateral TMA's, obesity, HTN, medical non-compliance, NICM. Admitted 5/19 w/left leg pain, + radiological evidence of osteomyelitis w/wound nonhealing, dehiscence, purulent drainage. Admitted for IV antibiotics, hemodialysis and possible surgery. Found unresponsive in her room that afternoon - cardiac arrest > rec'd  CPR, epi bicarb; intubated; cooled. Then developed anoxic brain injury, trach and s/p L AKA this hosp. Course further complicated by recurrent aspiration PNA, dehiscence of AK wound requiring revision 7/6.  Now decannulated, ABI improving, and pt stable and ready for discharge pending choosing a SNF.   Problem/Plan: 1. Anoxic brain injury: s/p cardiac arrest. Minimal speech this am after awoken  2. Resp failure, sp trach:  decannulated 7/16. 3. ESRD: TTS HD.  Continue normal schedule.  No heparin 4. Debility: cannot sit up by herself and can't sit in a WC.Not able to dc due to inherent OP HD in-center and transportation requirements she cannot meet w/ her significant physical disabilities. She is not ready for OP dialysis physically and not sure if/ when she will be.   Dr. Arlean Hopping  Has d/w PT, primary MD and SW on 6E.   5. HTN/ volume: On low-dose coreg.  Required midodrine before, now d/c'd.  EDW is fluctuating. Need to try to avoid overtreating BP's since she is having some orthostatic drops.  Not removing any fluid w HD as well for now.  6. Anemia Of ESRD :  HGB 9.4, Darbopoetin 200 q week. Thurs /  Repeat iron panel with Tsat 36% 7/19, no need for IV iron 7. Bone/ Mineral:  Renal diet, no binder, calcitriol d/c'd due to increasing Ca.  PTH 88 here.  If still here next week, need to repeat PTH. Am Ca  8.8  Corec = 9.3/  Phos= 5.8 <6.1  Start binder back Renvela =on prior as OP  8. 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  S/p wound vac  9. DM 2:  per primary 10. Chronic systolic CHF: EF 02-58%; improving BP stable >  Attempt 2 liter uf  hd in am  bp 143/87, on 6.25mg  bid coreg  11. Nutrition: albumin 2.2.>2.4  Taking prostat.   Lenny Pastel, PA-C [facility] [phone] 04/24/2017,9:00 AM  LOS: 69 days   Pt seen, examined and agree w A/P as above.  Vinson Moselle MD [facility] pager [phone]   04/24/2017, 12:58 PM    Labs: Basic Metabolic Panel:  Recent Labs Lab 04/18/17 1729 04/21/17 0800 04/23/17 1554  NA 131* 132* 131*  K 4.1 3.8 4.2  CL 92* 92* 94*  CO2 27 26 26   GLUCOSE 102* 90 152*  BUN 40* 49* 45*  CREATININE 4.64* 5.31* 5.16*  CALCIUM 9.0 8.9 8.9  PHOS 4.9* 6.1* 5.8*   Liver Function Tests:  Recent Labs Lab 04/18/17 1729 04/21/17 0800 04/23/17 1554  ALBUMIN 2.3* 2.4* 2.4*   No results for input(s): LIPASE, AMYLASE in the last 168  hours. No results for input(s): AMMONIA in the last 168 hours. CBC:  Recent Labs Lab 04/18/17 1730 04/21/17 0800 04/23/17 1554  WBC 8.3 7.2 7.9  HGB 8.9* 9.4* 9.4*  HCT 30.3* 30.8* 30.7*  MCV 98.1 95.1 95.0  PLT 374 365 326   Cardiac Enzymes: No results for input(s): CKTOTAL, CKMB, CKMBINDEX, TROPONINI in the last 168 hours. CBG:  Recent Labs Lab 04/22/17 2111 04/23/17 0734 04/23/17 1224 04/23/17 2006 04/24/17 0746  GLUCAP 151* 97 115* 90 121*    Studies/Results: No results found. Medications: . sodium chloride    . sodium chloride     . carvedilol  6.25 mg Oral BID WC  . darbepoetin (ARANESP) injection - DIALYSIS  200 mcg Intravenous Q Thu-HD  . dextrose  1 ampule Intravenous Once  . docusate sodium  100 mg Oral BID  . DULoxetine  30 mg Oral Daily  . feeding supplement (PRO-STAT SUGAR FREE 64)  30 mL Oral TID  . [name]'s butt cream   Topical QID  . heparin  5,000 Units Subcutaneous Q8H  . insulin aspart  0-15 Units Subcutaneous TID WC  . insulin glargine  8 Units Subcutaneous Daily  . mouth rinse  15 mL Mouth Rinse BID  . multivitamin  1 tablet Oral QHS  . sodium chloride flush  3 mL Intravenous Q12H

## 2017-04-24 NOTE — Progress Notes (Signed)
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   Admitted on 02/14/17 to [facility] with left foot/leg pain, wound dehiscence and purulent drainage from the left trans-metatarsal amputation site.  She was subsequently found to be unresponsive on 5/19. Noted to bein asystole and CPR started with ROSC in approximately 9 minutes. She was intubated by anaesthesia, transferred to the intensive care unit and subsequently underwent cooling protocol. She couldn't be weaned off the ventilator and as a result underwent tracheostomy on 5/31. She underwent decannulation on 7/16.  Neurological/ Cognitive status has been poor. She has been evaluated by neurology on 5/29 and, per neurology, chances of meaningful neurological recovery appeared to be dismal.  Currently she is able to follow simple commands and is alert and is eating.  Hospital course complicated by persistent leukocytosis, respiratory failure and left transmetatarsal wound dehiscence with gangrene. 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  Over the course of her hospitalizations, she has been treated for multiple episodes of recurrent aspiration pneumonias.  Currently off antibiotics and awaiting placement.  The patient is unable to sit in a normal wheel chair to be transported to the HD due to poor truncal stability.  She cannot be discharged until she is able to sit in a normal wheel chair(without sliding down) for transport to HD.    Subjective: No complaints. ROS: no complaints of nausea, vomiting, constipation diarrhea, cough, dyspnea or dysuria. No other complaints.   Assessment &  Plan:  Acute /chronichypoxemic vent dependent respiratory failure in a setting of cardiac arrest Aspiration pneumonia- RLL  Tracheostomy  - Intubated 6/19-6/20.  5/31- tracheostomy- - PCCM following  - S/P MBS 6/23. Now status post FEES 6/27. Patient started on nectar thick liquids and dysphagia 1 diet 6/27. Holding off on PEG   - has had aspiration issues- recurrent aspiration on 6/28-  Repeat chest x-ray on 6/29 showed new right-sided infiltrate - She was started on Fortaz on 6/29 and changed over to Zosyn on 6/30   - Chest x-ray from 7/1 showed improving opacity in the right lung. 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  - Orthopedics removed wound VAC 6/20 - revision of her left AKA  04/03/17  with reapplication of wound vac which was subsequently removed. -  wound remains clean   Right heel eschar - wound care RN following - Prevalon boot  Ulcers on sacral area- incontinence associated dermatitis - WOC >> Cleanse and apply Purple Top Criticaid Cream BID and PRN incontinence of urine or stool - also applying barrier cream - air mattress    Diabetes mellitus 2 - cont Lantus 8 U and SSI with  meals  Thrush - resolved with treatment with Diflucan and Nystatin  Anoxic brain injury secondary to Cardiac arrest on 5/19: - slow cognitive improvement- following commands, talking & feeding herself now  Diarrhea  - resolved    Normocytic  anemia Likely secondary to chronic disease-probably worsened by acute illness. No signs of bleeding, hemoglobin has been stable with some fluctuations.   Diabetes mellitus type 2  Continue with SSI. On Lantus. HbA1c 5.9.   Chronic systolic heart failure/nonischemic cardiomyopathy - Echocardiogram>> EF 25-30% with diffuse hypokinesis - volume management with dialysis - cont Coreg as tolerated  Hypotension/ HTN - Midodrine with dialysis to be discontinued  due to resolution of hypotension - using Coreg now    Pericardial effusion Seen on TEE- no evidence of tamponade pathophysiology. Repeat echo 6/26 with no significant pericardial effusion.   Moderate protein calorie malnutrition - taking orals well now- cont Prostat  Depression?  - has been on Cymbalta at home which was being held-   resumed on 7/13  Dysphagia  - D 2 diet with nectar thick liquids   DVT prophylaxis: Heparin Code Status: Full code Family Communication: mother Disposition Plan: SNF  Consultants:   PCCM    Nephrology  Palliative care consult   Orthopedics,  Procedures:  Lower extremity ultrasound 5/18 >> no evidence of DVT CT head 5/19 >> normal exam Echo 5/20 >>EF 30-35%, increased LVF. Diffuse hypokinesis, akinesis of basilar mid-inferior myocardium, grade 2 diastolic dysfxn  EEG 5/20 >>finding c/w mod to severe global cerebral dysfxn. C/w anoxic injury given clinical course  LE [place] 5/21 >> negative MRI brain 5/24 >> ?thrombosed cortical vein, otherwise normal TEE 6/4 >>mild MR, normal AV, mild TR, mild PR, EF 30-35%, diffuse hypokinesis, no thrombus, no PFO, normal RV, moderate pericardial effusion with synechia suggesting some chronicity, no vegetations   Antimicrobials:  Anti-infectives    Start     Dose/Rate Route Frequency Ordered Stop   04/13/17 1000  fluconazole (DIFLUCAN) tablet 100 mg     100 mg Oral Daily 04/12/17 1356 04/15/17 0959   04/08/17 1000  fluconazole (DIFLUCAN) IVPB  100 mg  Status:  Discontinued     100 mg 50 mL/hr over 60 Minutes Intravenous Every 24 hours 04/08/17 0851 04/12/17 1356   04/04/17 0600  ceFAZolin (ANCEF) IVPB 2g/100 mL premix  Status:  Discontinued     2 g 200 mL/hr over 30 Minutes Intravenous On call to O.R. 04/03/17 1840 04/03/17 1848   03/28/17 1600  cefTAZidime (FORTAZ) 1 g in dextrose 5 % 50 mL IVPB  Status:  Discontinued     1 g 100 mL/hr over 30 Minutes Intravenous Every 24 hours 03/27/17 0814 03/28/17 0752   03/28/17 1100  piperacillin-tazobactam (ZOSYN) IVPB 3.375 g     3.375 g 12.5 mL/hr over 240 Minutes Intravenous Every 12 hours 03/28/17 1038 04/11/17 1359   03/27/17 0815  cefTAZidime (FORTAZ) 1 g in dextrose 5 % 50 mL IVPB     1 g 100 mL/hr over 30 Minutes Intravenous  Once 03/27/17 0814 03/27/17 1026   03/17/17 1800  cefTAZidime (FORTAZ) 2 g in dextrose 5 % 50 mL IVPB  Status:  Discontinued     2 g 100 mL/hr over 30 Minutes Intravenous Every T-Th-Sa (1800) 03/15/17 1143 03/18/17 1223   03/17/17 1200  vancomycin (VANCOCIN) IVPB 1000 mg/200 mL premix  Status:  Discontinued     1,000 mg 200 mL/hr over 60 Minutes Intravenous Every T-Th-Sa (Hemodialysis) 03/15/17 1143 03/18/17 1223  03/14/17 1200  vancomycin (VANCOCIN) IVPB 1000 mg/200 mL premix  Status:  Discontinued     1,000 mg 200 mL/hr over 60 Minutes Intravenous Every T-Th-Sa (Hemodialysis) 03/12/17 0912 03/14/17 1542   03/13/17 1430  ceFAZolin (ANCEF) IVPB 2g/100 mL premix  Status:  Discontinued     2 g 200 mL/hr over 30 Minutes Intravenous To ShortStay Surgical 03/12/17 1208 03/13/17 1113   03/13/17 1115  ceFAZolin (ANCEF) IVPB 2g/100 mL premix     2 g 200 mL/hr over 30 Minutes Intravenous To Surgery 03/13/17 1108 03/13/17 1506   03/12/17 1800  cefTAZidime (FORTAZ) 2 g in dextrose 5 % 50 mL IVPB  Status:  Discontinued     2 g 100 mL/hr over 30 Minutes Intravenous Every T-Th-Sa (1800) 03/12/17 0912 03/14/17 1542   03/12/17 1200  vancomycin (VANCOCIN) 1,500 mg in  sodium chloride 0.9 % 250 mL IVPB     1,500 mg 250 mL/hr over 60 Minutes Intravenous Every Thu (Hemodialysis) 03/12/17 0912 03/12/17 1356   03/02/17 1200  cefTRIAXone (ROCEPHIN) 2 g in dextrose 5 % 50 mL IVPB     2 g 100 mL/hr over 30 Minutes Intravenous Every 24 hours 03/02/17 0810 03/06/17 1358   03/02/17 1200  vancomycin (VANCOCIN) IVPB 1000 mg/200 mL premix     1,000 mg 200 mL/hr over 60 Minutes Intravenous Every M-W-F (Hemodialysis) 03/02/17 0815 03/02/17 1457   03/02/17 1200  metroNIDAZOLE (FLAGYL) IVPB 500 mg     500 mg 100 mL/hr over 60 Minutes Intravenous Every 8 hours 03/02/17 0948 03/06/17 2230   02/26/17 1200  vancomycin (VANCOCIN) IVPB 1000 mg/200 mL premix     1,000 mg 200 mL/hr over 60 Minutes Intravenous Every T-Th-Sa (Hemodialysis) 02/25/17 1151 03/05/17 1325   02/25/17 1200  vancomycin (VANCOCIN) 2,000 mg in sodium chloride 0.9 % 500 mL IVPB     2,000 mg 250 mL/hr over 120 Minutes Intravenous  Once 02/25/17 1149 02/25/17 1449   02/25/17 0900  cefTRIAXone (ROCEPHIN) 2 g in dextrose 5 % 50 mL IVPB  Status:  Discontinued     2 g 100 mL/hr over 30 Minutes Intravenous Every 24 hours 02/25/17 0814 03/02/17 0810   02/25/17 0900  metroNIDAZOLE (FLAGYL) IVPB 500 mg  Status:  Discontinued     500 mg 100 mL/hr over 60 Minutes Intravenous Every 8 hours 02/25/17 0814 03/02/17 0948   02/20/17 1200  vancomycin (VANCOCIN) IVPB 1000 mg/200 mL premix  Status:  Discontinued     1,000 mg 200 mL/hr over 60 Minutes Intravenous Every M-W-F (Hemodialysis) 02/19/17 1506 02/20/17 1403   02/17/17 1800  meropenem (MERREM) 500 mg in sodium chloride 0.9 % 50 mL IVPB  Status:  Discontinued     500 mg 100 mL/hr over 30 Minutes Intravenous Daily-1800 02/17/17 1026 02/25/17 0814   02/17/17 1200  vancomycin (VANCOCIN) IVPB 1000 mg/200 mL premix     1,000 mg 200 mL/hr over 60 Minutes Intravenous Every T-Th-Sa (Hemodialysis) 02/14/17 1758 02/19/17 1612   02/14/17 2200  clindamycin (CLEOCIN) IVPB 900 mg   Status:  Discontinued     900 mg 100 mL/hr over 30 Minutes Intravenous Every 8 hours 02/14/17 1106 02/14/17 1928   02/14/17 2000  clindamycin (CLEOCIN) IVPB 900 mg  Status:  Discontinued     900 mg 100 mL/hr over 30 Minutes Intravenous Every 8 hours 02/14/17 1928 02/16/17 1047   02/14/17 1915  piperacillin-tazobactam (ZOSYN) IVPB 3.375 g  Status:  Discontinued     3.375 g 100 mL/hr over 30  Minutes Intravenous Every 12 hours 02/14/17 1801 02/17/17 1026   02/14/17 1845  vancomycin (VANCOCIN) 2,000 mg in sodium chloride 0.9 % 500 mL IVPB     2,000 mg 250 mL/hr over 120 Minutes Intravenous  Once 02/14/17 1758 02/14/17 2107   02/14/17 1115  clindamycin (CLEOCIN) IVPB 900 mg  Status:  Discontinued     900 mg 100 mL/hr over 30 Minutes Intravenous  Once 02/14/17 1106 02/14/17 2236   02/14/17 0945  clindamycin (CLEOCIN) IVPB 600 mg     600 mg 100 mL/hr over 30 Minutes Intravenous  Once 02/14/17 0931 02/14/17 1013       Objective: Vitals:   04/23/17 1953 04/23/17 2025 04/24/17 0444 04/24/17 0730  BP: (!) 147/87  140/85 (!) 143/87  Pulse: (!) 114  97 (!) 115  Resp: 20  16 18   Temp: (!) 97.3 F (36.3 C)  98.8 F (37.1 C) 99.3 F (37.4 C)  TempSrc: Oral   Oral  SpO2: 100%  94% 99%  Weight:  76 kg (167 lb 8.8 oz)    Height:        Intake/Output Summary (Last 24 hours) at 04/24/17 1622 Last data filed at 04/24/17 1322  Gross per 24 hour  Intake              303 ml  Output                0 ml  Net              303 ml   Filed Weights   04/23/17 1509 04/23/17 1920 04/23/17 2025  Weight: 77 kg (169 lb 12.1 oz) 77 kg (169 lb 12.1 oz) 76 kg (167 lb 8.8 oz)    Examination: General exam: Appears comfortable -  HEENT: PERRLA, oral mucosa moist Respiratory system: Clear to auscultation. Respiratory effort normal. Cardiovascular system: S1 & S2 heard, RRR.  No murmurs, tachycardic Gastrointestinal system: Abdomen soft, non-tender, nondistended. Normal bowel sound. No  organomegaly Central nervous system: Alert - moving arms and legs - normal tone in extermities Extremities: No cyanosis, clubbing or edema- left AKA  - incision is clean Skin:  - right heel eschar Psych: flat affect, poor communication    Data Reviewed: I have personally reviewed following labs and imaging studies  CBC:  Recent Labs Lab 04/18/17 1730 04/21/17 0800 04/23/17 1554  WBC 8.3 7.2 7.9  HGB 8.9* 9.4* 9.4*  HCT 30.3* 30.8* 30.7*  MCV 98.1 95.1 95.0  PLT 374 365 326   Basic Metabolic Panel:  Recent Labs Lab 04/18/17 1729 04/21/17 0800 04/23/17 1554  NA 131* 132* 131*  K 4.1 3.8 4.2  CL 92* 92* 94*  CO2 27 26 26   GLUCOSE 102* 90 152*  BUN 40* 49* 45*  CREATININE 4.64* 5.31* 5.16*  CALCIUM 9.0 8.9 8.9  PHOS 4.9* 6.1* 5.8*   GFR: Estimated Creatinine Clearance: 15.3 mL/min (A) (by C-G formula based on SCr of 5.16 mg/dL (H)). Liver Function Tests:  Recent Labs Lab 04/18/17 1729 04/21/17 0800 04/23/17 1554  ALBUMIN 2.3* 2.4* 2.4*   No results for input(s): LIPASE, AMYLASE in the last 168 hours. No results for input(s): AMMONIA in the last 168 hours. Coagulation Profile: No results for input(s): INR, PROTIME in the last 168 hours. Cardiac Enzymes: No results for input(s): CKTOTAL, CKMB, CKMBINDEX, TROPONINI in the last 168 hours. BNP (last 3 results) No results for input(s): PROBNP in the last 8760 hours. HbA1C: No results for input(s): HGBA1C in  the last 72 hours. CBG:  Recent Labs Lab 04/23/17 0734 04/23/17 1224 04/23/17 2006 04/24/17 0746 04/24/17 1214  GLUCAP 97 115* 90 121* 149*   Lipid Profile: No results for input(s): CHOL, HDL, LDLCALC, TRIG, CHOLHDL, LDLDIRECT in the last 72 hours. Thyroid Function Tests: No results for input(s): TSH, T4TOTAL, FREET4, T3FREE, THYROIDAB in the last 72 hours. Anemia Panel: No results for input(s): VITAMINB12, FOLATE, FERRITIN, TIBC, IRON, RETICCTPCT in the last 72 hours. Urine analysis:     Component Value Date/Time   COLORURINE AMBER (A) 02/14/2017 2111   APPEARANCEUR HAZY (A) 02/14/2017 2111   LABSPEC 1.015 02/14/2017 2111   PHURINE 5.0 02/14/2017 2111   GLUCOSEU NEGATIVE 02/14/2017 2111   HGBUR SMALL (A) 02/14/2017 2111   BILIRUBINUR NEGATIVE 02/14/2017 2111   KETONESUR NEGATIVE 02/14/2017 2111   PROTEINUR 30 (A) 02/14/2017 2111   UROBILINOGEN 0.2 09/21/2014 1908   NITRITE NEGATIVE 02/14/2017 2111   LEUKOCYTESUR TRACE (A) 02/14/2017 2111   Sepsis Labs: @LABRCNTIP (procalcitonin:4,lacticidven:4) )No results found for this or any previous visit (from the past 240 hour(s)).       Radiology Studies: No results found.    Scheduled Meds: . carvedilol  6.25 mg Oral BID WC  . darbepoetin (ARANESP) injection - DIALYSIS  200 mcg Intravenous Q Thu-HD  . dextrose  1 ampule Intravenous Once  . docusate sodium  100 mg Oral BID  . DULoxetine  30 mg Oral Daily  . feeding supplement (PRO-STAT SUGAR FREE 64)  30 mL Oral TID  . [name]'s butt cream   Topical QID  . heparin  5,000 Units Subcutaneous Q8H  . insulin aspart  0-15 Units Subcutaneous TID WC  . insulin glargine  8 Units Subcutaneous Daily  . mouth rinse  15 mL Mouth Rinse BID  . multivitamin  1 tablet Oral QHS  . sevelamer carbonate  1.6 g Oral TID WC  . sodium chloride flush  3 mL Intravenous Q12H   Continuous Infusions: . sodium chloride    . sodium chloride       LOS: 69 days    Time spent in minutes: 35    Calvert Cantor, MD [facility] Pager: [email or web address] Password [facility] 04/24/2017, 4:22 PM

## 2017-04-24 NOTE — Plan of Care (Signed)
Problem: Activity: Goal: Risk for activity intolerance will decrease Outcome: Progressing Maximove used to transfer patent from bed to wheelchair. Patient tolerated well. Will continue to monitor. Bess Kinds, RN

## 2017-04-24 NOTE — Clinical Social Work Note (Addendum)
CSW informed earlier today by nurse case manager that patient was hoyer lifted into a wheelchair and was able to sit for 2 hours and ate her lunch in the wheelchair. CSW reviewed notes and will continue to follow as nursing and PT works with patient on consistently being able to sit in a wheelchair so that she can be transported to dialysis once discharged to a skilled facility. Patient has been taken to dialysis in a dialysis chair (not wheelchair) several times and is not having problems with the dialysis chair.  [facility] bed offer and was willing to accept patient, however admissions director Velna Hatchet declined on 7/26 during conversation with CSW regarding placement.  Velna Hatchet indicated awareness that patient was not able to sit in a wheelchair, and this would be how she would be transported to dialysis. [facility] will be contacted again to determine if they will reconsider patient.   CSW will continue to follow and facility discharge to a skilled facility once patient can be discharged to a skilled facility.

## 2017-04-25 LAB — RENAL FUNCTION PANEL
ALBUMIN: 2.5 g/dL — AB (ref 3.5–5.0)
Anion gap: 11 (ref 5–15)
BUN: 41 mg/dL — AB (ref 6–20)
CO2: 26 mmol/L (ref 22–32)
CREATININE: 4.61 mg/dL — AB (ref 0.44–1.00)
Calcium: 9.1 mg/dL (ref 8.9–10.3)
Chloride: 94 mmol/L — ABNORMAL LOW (ref 101–111)
GFR calc Af Amer: 13 mL/min — ABNORMAL LOW (ref >60)
GFR calc non Af Amer: 11 mL/min — ABNORMAL LOW (ref >60)
GLUCOSE: 128 mg/dL — AB (ref 65–99)
PHOSPHORUS: 4.5 mg/dL (ref 2.5–4.6)
POTASSIUM: 4 mmol/L (ref 3.5–5.1)
Sodium: 131 mmol/L — ABNORMAL LOW (ref 135–145)

## 2017-04-25 LAB — CBC
HEMATOCRIT: 30.7 % — AB (ref 36.0–46.0)
Hemoglobin: 9.1 g/dL — ABNORMAL LOW (ref 12.0–15.0)
MCH: 28.6 pg (ref 26.0–34.0)
MCHC: 29.6 g/dL — AB (ref 30.0–36.0)
MCV: 96.5 fL (ref 78.0–100.0)
PLATELETS: 276 10*3/uL (ref 150–400)
RBC: 3.18 MIL/uL — ABNORMAL LOW (ref 3.87–5.11)
RDW: 16.1 % — AB (ref 11.5–15.5)
WBC: 7.9 10*3/uL (ref 4.0–10.5)

## 2017-04-25 LAB — GLUCOSE, CAPILLARY
GLUCOSE-CAPILLARY: 116 mg/dL — AB (ref 65–99)
GLUCOSE-CAPILLARY: 136 mg/dL — AB (ref 65–99)
Glucose-Capillary: 95 mg/dL (ref 65–99)
Glucose-Capillary: 158 mg/dL — ABNORMAL HIGH (ref 65–99)

## 2017-04-25 NOTE — Plan of Care (Signed)
Problem: Activity: Goal: Risk for activity intolerance will decrease Outcome: Progressing Patient transferred back to bed using hoyer lift in order to complete adls. Patient transferred to hemodialysis recliner using hoyer lift prior to transport to hemodialysis. Will continue to monitor patient tolerance out of bed. Bess Kinds, RN

## 2017-04-25 NOTE — Progress Notes (Deleted)
  I was present at this dialysis session, have reviewed the session itself and made  appropriate changes Vinson Moselle MD [facility] pager [phone]   04/25/2017, 1:07 PM

## 2017-04-25 NOTE — Plan of Care (Signed)
Problem: Activity: Goal: Risk for activity intolerance will decrease Outcome: Progressing Patient out of bed to wheelchair this morning using hoyer lift. Patient tolerated well. Will continue to monitor. Bess Kinds, RN

## 2017-04-25 NOTE — Progress Notes (Signed)
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   Admitted on 02/14/17 to [facility] with left foot/leg pain, wound dehiscence and purulent drainage from the left trans-metatarsal amputation site.  She was subsequently found to be unresponsive on 5/19. Noted to bein asystole and CPR started with ROSC in approximately 9 minutes. She was intubated by anaesthesia, transferred to the intensive care unit and subsequently underwent cooling protocol. She couldn't be weaned off the ventilator and as a result underwent tracheostomy on 5/31. She underwent decannulation on 7/16.  Neurological/ Cognitive status has been poor. She has been evaluated by neurology on 5/29 and, per neurology, chances of meaningful neurological recovery appeared to be dismal.  Currently she is able to follow simple commands and is alert and is eating.  Hospital course complicated by persistent leukocytosis, respiratory failure and left transmetatarsal wound dehiscence with gangrene. 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  Over the course of her hospitalizations, she has been treated for multiple episodes of recurrent aspiration pneumonias.  Currently off antibiotics and awaiting placement.  The patient is unable to sit in a normal wheel chair to be transported to the HD due to poor truncal stability.  She cannot be discharged until she is able to sit in a normal wheel chair(without sliding down) for transport to HD.    Subjective: No complaints. ROS: no complaints of nausea, vomiting, constipation diarrhea, cough, dyspnea or dysuria. No other complaints.   Assessment &  Plan:  Acute /chronichypoxemic vent dependent respiratory failure in a setting of cardiac arrest Aspiration pneumonia- RLL  Tracheostomy  - Intubated 6/19-6/20.  5/31- tracheostomy- - PCCM following  - S/P MBS 6/23. Now status post FEES 6/27. Patient started on nectar thick liquids and dysphagia 1 diet 6/27. Holding off on PEG   - has had aspiration issues- recurrent aspiration on 6/28-  Repeat chest x-ray on 6/29 showed new right-sided infiltrate - She was started on Fortaz on 6/29 and changed over to Zosyn on 6/30   - Chest x-ray from 7/1 showed improving opacity in the right lung. 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  - Orthopedics removed wound VAC 6/20 - revision of her left AKA  04/03/17  with reapplication of wound vac which was subsequently removed. -  wound remains clean   Right heel eschar - wound care RN following - Prevalon boot  Ulcers on sacral area- incontinence associated dermatitis - WOC >> Cleanse and apply Purple Top Crit[employer] Cream BID and PRN incontinence of urine or stool - also applying barrier cream - air mattress    Diabetes mellitus 2 - cont Lantus 8 U and SSI with  meals  Thrush - resolved with treatment with Diflucan and Nystatin  Anoxic brain injury secondary to Cardiac arrest on 5/19: - slow cognitive improvement- following commands, talking & feeding herself now  Diarrhea  - resolved    Normocytic  anemia Likely secondary to chronic disease-probably worsened by acute illness. No signs of bleeding, hemoglobin has been stable with some fluctuations.   Diabetes mellitus type 2  Continue with SSI. On Lantus. HbA1c 5.9.   Chronic systolic heart failure/nonischemic cardiomyopathy - Echocardiogram>> EF 25-30% with diffuse hypokinesis - volume management with dialysis - cont Coreg as tolerated  Hypotension/ HTN - Midodrine with dialysis to be discontinued  due to resolution of hypotension - using Coreg now    Pericardial effusion Seen on TEE- no evidence of tamponade pathophysiology. Repeat echo 6/26 with no significant pericardial effusion.   Moderate protein calorie malnutrition - taking orals well now- cont Prostat  Depression?  - has been on Cymbalta at home which was being held-   resumed on 7/13  Dysphagia  - D 2 diet with nectar thick liquids   DVT prophylaxis: Heparin Code Status: Full code Family Communication: mother Disposition Plan: SNF  Consultants:   PCCM    Nephrology  Palliative care consult   Orthopedics,  Procedures:  Lower extremity ultrasound 5/18 >> no evidence of DVT CT head 5/19 >> normal exam Echo 5/20 >>EF 30-35%, increased LVF. Diffuse hypokinesis, akinesis of basilar mid-inferior myocardium, grade 2 diastolic dysfxn  EEG 5/20 >>finding c/w mod to severe global cerebral dysfxn. C/w anoxic injury given clinical course  LE [place] 5/21 >> negative MRI brain 5/24 >> ?thrombosed cortical vein, otherwise normal TEE 6/4 >>mild MR, normal AV, mild TR, mild PR, EF 30-35%, diffuse hypokinesis, no thrombus, no PFO, normal RV, moderate pericardial effusion with synechia suggesting some chronicity, no vegetations   Antimicrobials:  Anti-infectives    Start     Dose/Rate Route Frequency Ordered Stop   04/13/17 1000  fluconazole (DIFLUCAN) tablet 100 mg     100 mg Oral Daily 04/12/17 1356 04/15/17 0959   04/08/17 1000  fluconazole (DIFLUCAN) IVPB  100 mg  Status:  Discontinued     100 mg 50 mL/hr over 60 Minutes Intravenous Every 24 hours 04/08/17 0851 04/12/17 1356   04/04/17 0600  ceFAZolin (ANCEF) IVPB 2g/100 mL premix  Status:  Discontinued     2 g 200 mL/hr over 30 Minutes Intravenous On call to O.R. 04/03/17 1840 04/03/17 1848   03/28/17 1600  cefTAZidime (FORTAZ) 1 g in dextrose 5 % 50 mL IVPB  Status:  Discontinued     1 g 100 mL/hr over 30 Minutes Intravenous Every 24 hours 03/27/17 0814 03/28/17 0752   03/28/17 1100  piperacillin-tazobactam (ZOSYN) IVPB 3.375 g     3.375 g 12.5 mL/hr over 240 Minutes Intravenous Every 12 hours 03/28/17 1038 04/11/17 1359   03/27/17 0815  cefTAZidime (FORTAZ) 1 g in dextrose 5 % 50 mL IVPB     1 g 100 mL/hr over 30 Minutes Intravenous  Once 03/27/17 0814 03/27/17 1026   03/17/17 1800  cefTAZidime (FORTAZ) 2 g in dextrose 5 % 50 mL IVPB  Status:  Discontinued     2 g 100 mL/hr over 30 Minutes Intravenous Every T-Th-Sa (1800) 03/15/17 1143 03/18/17 1223   03/17/17 1200  vancomycin (VANCOCIN) IVPB 1000 mg/200 mL premix  Status:  Discontinued     1,000 mg 200 mL/hr over 60 Minutes Intravenous Every T-Th-Sa (Hemodialysis) 03/15/17 1143 03/18/17 1223  03/14/17 1200  vancomycin (VANCOCIN) IVPB 1000 mg/200 mL premix  Status:  Discontinued     1,000 mg 200 mL/hr over 60 Minutes Intravenous Every T-Th-Sa (Hemodialysis) 03/12/17 0912 03/14/17 1542   03/13/17 1430  ceFAZolin (ANCEF) IVPB 2g/100 mL premix  Status:  Discontinued     2 g 200 mL/hr over 30 Minutes Intravenous To ShortStay Surgical 03/12/17 1208 03/13/17 1113   03/13/17 1115  ceFAZolin (ANCEF) IVPB 2g/100 mL premix     2 g 200 mL/hr over 30 Minutes Intravenous To Surgery 03/13/17 1108 03/13/17 1506   03/12/17 1800  cefTAZidime (FORTAZ) 2 g in dextrose 5 % 50 mL IVPB  Status:  Discontinued     2 g 100 mL/hr over 30 Minutes Intravenous Every T-Th-Sa (1800) 03/12/17 0912 03/14/17 1542   03/12/17 1200  vancomycin (VANCOCIN) 1,500 mg in  sodium chloride 0.9 % 250 mL IVPB     1,500 mg 250 mL/hr over 60 Minutes Intravenous Every Thu (Hemodialysis) 03/12/17 0912 03/12/17 1356   03/02/17 1200  cefTRIAXone (ROCEPHIN) 2 g in dextrose 5 % 50 mL IVPB     2 g 100 mL/hr over 30 Minutes Intravenous Every 24 hours 03/02/17 0810 03/06/17 1358   03/02/17 1200  vancomycin (VANCOCIN) IVPB 1000 mg/200 mL premix     1,000 mg 200 mL/hr over 60 Minutes Intravenous Every M-W-F (Hemodialysis) 03/02/17 0815 03/02/17 1457   03/02/17 1200  metroNIDAZOLE (FLAGYL) IVPB 500 mg     500 mg 100 mL/hr over 60 Minutes Intravenous Every 8 hours 03/02/17 0948 03/06/17 2230   02/26/17 1200  vancomycin (VANCOCIN) IVPB 1000 mg/200 mL premix     1,000 mg 200 mL/hr over 60 Minutes Intravenous Every T-Th-Sa (Hemodialysis) 02/25/17 1151 03/05/17 1325   02/25/17 1200  vancomycin (VANCOCIN) 2,000 mg in sodium chloride 0.9 % 500 mL IVPB     2,000 mg 250 mL/hr over 120 Minutes Intravenous  Once 02/25/17 1149 02/25/17 1449   02/25/17 0900  cefTRIAXone (ROCEPHIN) 2 g in dextrose 5 % 50 mL IVPB  Status:  Discontinued     2 g 100 mL/hr over 30 Minutes Intravenous Every 24 hours 02/25/17 0814 03/02/17 0810   02/25/17 0900  metroNIDAZOLE (FLAGYL) IVPB 500 mg  Status:  Discontinued     500 mg 100 mL/hr over 60 Minutes Intravenous Every 8 hours 02/25/17 0814 03/02/17 0948   02/20/17 1200  vancomycin (VANCOCIN) IVPB 1000 mg/200 mL premix  Status:  Discontinued     1,000 mg 200 mL/hr over 60 Minutes Intravenous Every M-W-F (Hemodialysis) 02/19/17 1506 02/20/17 1403   02/17/17 1800  meropenem (MERREM) 500 mg in sodium chloride 0.9 % 50 mL IVPB  Status:  Discontinued     500 mg 100 mL/hr over 30 Minutes Intravenous Daily-1800 02/17/17 1026 02/25/17 0814   02/17/17 1200  vancomycin (VANCOCIN) IVPB 1000 mg/200 mL premix     1,000 mg 200 mL/hr over 60 Minutes Intravenous Every T-Th-Sa (Hemodialysis) 02/14/17 1758 02/19/17 1612   02/14/17 2200  clindamycin (CLEOCIN) IVPB 900 mg   Status:  Discontinued     900 mg 100 mL/hr over 30 Minutes Intravenous Every 8 hours 02/14/17 1106 02/14/17 1928   02/14/17 2000  clindamycin (CLEOCIN) IVPB 900 mg  Status:  Discontinued     900 mg 100 mL/hr over 30 Minutes Intravenous Every 8 hours 02/14/17 1928 02/16/17 1047   02/14/17 1915  piperacillin-tazobactam (ZOSYN) IVPB 3.375 g  Status:  Discontinued     3.375 g 100 mL/hr over 30  Minutes Intravenous Every 12 hours 02/14/17 1801 02/17/17 1026   02/14/17 1845  vancomycin (VANCOCIN) 2,000 mg in sodium chloride 0.9 % 500 mL IVPB     2,000 mg 250 mL/hr over 120 Minutes Intravenous  Once 02/14/17 1758 02/14/17 2107   02/14/17 1115  clindamycin (CLEOCIN) IVPB 900 mg  Status:  Discontinued     900 mg 100 mL/hr over 30 Minutes Intravenous  Once 02/14/17 1106 02/14/17 2236   02/14/17 0945  clindamycin (CLEOCIN) IVPB 600 mg     600 mg 100 mL/hr over 30 Minutes Intravenous  Once 02/14/17 0931 02/14/17 1013       Objective: Vitals:   04/25/17 0431 04/25/17 0900 04/25/17 1245 04/25/17 1255  BP: (!) 155/98 (!) 150/85 120/84 118/80  Pulse: (!) 114 (!) 117 (!) 105 (!) 103  Resp: 18 20 (!) 25   Temp: 98.9 F (37.2 C) 98.3 F (36.8 C) 97.8 F (36.6 C)   TempSrc: Oral Oral Oral   SpO2: 97% 95% 98%   Weight:      Height:        Intake/Output Summary (Last 24 hours) at 04/25/17 1321 Last data filed at 04/25/17 0900  Gross per 24 hour  Intake              480 ml  Output                0 ml  Net              480 ml   Filed Weights   04/23/17 1920 04/23/17 2025 04/24/17 2106  Weight: 77 kg (169 lb 12.1 oz) 76 kg (167 lb 8.8 oz) 79 kg (174 lb 2.6 oz)    Examination: General exam: Appears comfortable - sleepy  HEENT: PERRLA, oral mucosa moist Respiratory system: Clear to auscultation. Respiratory effort normal. Cardiovascular system: S1 & S2 heard, RRR.  No murmurs, tachycardic Gastrointestinal system: Abdomen soft, non-tender, nondistended. Normal bowel sound. No  organomegaly Central nervous system: Alert - moving arms and legs - normal tone in extermities Extremities: No cyanosis, clubbing or edema- left AKA  - incision is clean Skin:  - right heel eschar Psych: flat affect, poor communication    Data Reviewed: I have personally reviewed following labs and imaging studies  CBC:  Recent Labs Lab 04/18/17 1730 04/21/17 0800 04/23/17 1554 04/25/17 1247  WBC 8.3 7.2 7.9 7.9  HGB 8.9* 9.4* 9.4* 9.1*  HCT 30.3* 30.8* 30.7* 30.7*  MCV 98.1 95.1 95.0 96.5  PLT 374 365 326 276   Basic Metabolic Panel:  Recent Labs Lab 04/18/17 1729 04/21/17 0800 04/23/17 1554  NA 131* 132* 131*  K 4.1 3.8 4.2  CL 92* 92* 94*  CO2 27 26 26   GLUCOSE 102* 90 152*  BUN 40* 49* 45*  CREATININE 4.64* 5.31* 5.16*  CALCIUM 9.0 8.9 8.9  PHOS 4.9* 6.1* 5.8*   GFR: Estimated Creatinine Clearance: 16.7 mL/min (A) (by C-G formula based on SCr of 5.16 mg/dL (H)). Liver Function Tests:  Recent Labs Lab 04/18/17 1729 04/21/17 0800 04/23/17 1554  ALBUMIN 2.3* 2.4* 2.4*   No results for input(s): LIPASE, AMYLASE in the last 168 hours. No results for input(s): AMMONIA in the last 168 hours. Coagulation Profile: No results for input(s): INR, PROTIME in the last 168 hours. Cardiac Enzymes: No results for input(s): CKTOTAL, CKMB, CKMBINDEX, TROPONINI in the last 168 hours. BNP (last 3 results) No results for input(s): PROBNP in the last 8760 hours. HbA1C: No  results for input(s): HGBA1C in the last 72 hours. CBG:  Recent Labs Lab 04/24/17 1214 04/24/17 1724 04/24/17 2109 04/25/17 0800 04/25/17 1234  GLUCAP 149* 144* 129* 116* 136*   Lipid Profile: No results for input(s): CHOL, HDL, LDLCALC, TRIG, CHOLHDL, LDLDIRECT in the last 72 hours. Thyroid Function Tests: No results for input(s): TSH, T4TOTAL, FREET4, T3FREE, THYROIDAB in the last 72 hours. Anemia Panel: No results for input(s): VITAMINB12, FOLATE, FERRITIN, TIBC, IRON, RETICCTPCT in the  last 72 hours. Urine analysis:    Component Value Date/Time   COLORURINE AMBER (A) 02/14/2017 2111   APPEARANCEUR HAZY (A) 02/14/2017 2111   LABSPEC 1.015 02/14/2017 2111   PHURINE 5.0 02/14/2017 2111   GLUCOSEU NEGATIVE 02/14/2017 2111   HGBUR SMALL (A) 02/14/2017 2111   BILIRUBINUR NEGATIVE 02/14/2017 2111   KETONESUR NEGATIVE 02/14/2017 2111   PROTEINUR 30 (A) 02/14/2017 2111   UROBILINOGEN 0.2 09/21/2014 1908   NITRITE NEGATIVE 02/14/2017 2111   LEUKOCYTESUR TRACE (A) 02/14/2017 2111   Sepsis Labs: @LABRCNTIP (procalcitonin:4,lacticidven:4) )No results found for this or any previous visit (from the past 240 hour(s)).       Radiology Studies: No results found.    Scheduled Meds: . carvedilol  6.25 mg Oral BID WC  . darbepoetin (ARANESP) injection - DIALYSIS  200 mcg Intravenous Q Thu-HD  . dextrose  1 ampule Intravenous Once  . docusate sodium  100 mg Oral BID  . DULoxetine  30 mg Oral Daily  . feeding supplement (PRO-STAT SUGAR FREE 64)  30 mL Oral TID  . [name]'s butt cream   Topical QID  . heparin  5,000 Units Subcutaneous Q8H  . insulin aspart  0-15 Units Subcutaneous TID WC  . insulin glargine  8 Units Subcutaneous Daily  . mouth rinse  15 mL Mouth Rinse BID  . multivitamin  1 tablet Oral QHS  . sevelamer carbonate  1.6 g Oral TID WC  . sodium chloride flush  3 mL Intravenous Q12H   Continuous Infusions: . sodium chloride    . sodium chloride       LOS: 70 days    Time spent in minutes: 35    Calvert Cantor, MD [facility] Pager: [email or web address] Password [email or web address] 04/25/2017, 1:21 PM

## 2017-04-25 NOTE — Procedures (Signed)
  I was present at this dialysis session, have reviewed the session itself and made  appropriate changes Vinson Moselle MD [facility] pager [phone]   04/25/2017, 1:11 PM

## 2017-04-25 NOTE — Plan of Care (Signed)
Problem: Activity: Goal: Risk for activity intolerance will decrease Outcome: Progressing Patient tolerated being in recliner for hemodialysis. Transferred to bed using hoyer lift (maximove) following hemodialysis. Will continue to monitor. Bess Kinds, RN

## 2017-04-26 LAB — GLUCOSE, CAPILLARY
GLUCOSE-CAPILLARY: 116 mg/dL — AB (ref 65–99)
GLUCOSE-CAPILLARY: 96 mg/dL (ref 65–99)
Glucose-Capillary: 155 mg/dL — ABNORMAL HIGH (ref 65–99)
Glucose-Capillary: 140 mg/dL — ABNORMAL HIGH (ref 65–99)

## 2017-04-26 NOTE — Progress Notes (Signed)
Patient was able to sit in her wheelchair continuously from 1230 pm up to 400 pm.However we need to use hoyer lift in getting her out and in from bed to wheelchair.

## 2017-04-26 NOTE — Progress Notes (Signed)
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   Admitted on 02/14/17 to [facility] with left foot/leg pain, wound dehiscence and purulent drainage from the left trans-metatarsal amputation site.  She was subsequently found to be unresponsive on 5/19. Noted to bein asystole and CPR started with ROSC in approximately 9 minutes. She was intubated by anaesthesia, transferred to the intensive care unit and subsequently underwent cooling protocol. She couldn't be weaned off the ventilator and as a result underwent tracheostomy on 5/31. She underwent decannulation on 7/16.  Neurological/ Cognitive status has been poor. She has been evaluated by neurology on 5/29 and, per neurology, chances of meaningful neurological recovery appeared to be dismal.  Currently she is able to follow simple commands and is alert and is eating.  Hospital course complicated by persistent leukocytosis, respiratory failure and left transmetatarsal wound dehiscence with gangrene. 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  Over the course of her hospitalizations, she has been treated for multiple episodes of recurrent aspiration pneumonias.  Currently off antibiotics and awaiting placement.  The patient is unable to sit in a normal wheel chair to be transported to the HD due to poor truncal stability.  She cannot be discharged until she is able to sit in a normal wheel chair(without sliding down) for transport to HD.    Subjective: No complaints. Sleeping and difficult to awaken.  ROS: no complaints of nausea, vomiting, constipation diarrhea, cough, dyspnea or dysuria. No other  complaints.   Assessment & Plan:  Acute /chronichypoxemic vent dependent respiratory failure in a setting of cardiac arrest Aspiration pneumonia- RLL  Tracheostomy  - Intubated 6/19-6/20.  5/31- tracheostomy- - PCCM following  - S/P MBS 6/23. Now status post FEES 6/27. Patient started on nectar thick liquids and dysphagia 1 diet 6/27. Holding off on PEG   - has had aspiration issues- recurrent aspiration on 6/28-  Repeat chest x-ray on 6/29 showed new right-sided infiltrate - She was started on Fortaz on 6/29 and changed over to Zosyn on 6/30   - Chest x-ray from 7/1 showed improving opacity in the right lung. 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  - Orthopedics removed wound VAC 6/20 - revision of her left AKA  04/03/17  with reapplication of wound vac which was subsequently removed. -  wound remains clean   Right heel eschar - wound care RN following - Prevalon boot  Ulcers on sacral area- incontinence associated dermatitis - WOC   - barrier cream - air mattress    Diabetes mellitus 2 - cont Lantus 8 U and SSI with meals  Thrush - resolved with treatment with Diflucan and  Nystatin  Anoxic brain injury secondary to Cardiac arrest on 5/19: - slow cognitive improvement- following commands, talking & feeding herself now  Diarrhea  - resolved    Normocytic anemia Likely secondary to chronic disease-probably worsened by acute illness.  No signs of bleeding, hemoglobin has been stable with some fluctuations.   Diabetes mellitus type 2  Continue with SSI. On Lantus. HbA1c 5.9.   Chronic systolic heart failure/nonischemic cardiomyopathy - Echocardiogram>> EF 25-30% with diffuse hypokinesis - volume management with dialysis - cont Coreg as tolerated  Hypotension/ HTN - Midodrine with dialysis to be discontinued  due to resolution of hypotension - using Coreg now    Pericardial effusion Seen on TEE- no evidence of tamponade pathophysiology. Repeat echo 6/26 with no significant pericardial effusion.   Moderate protein calorie malnutrition - taking orals well now- cont Prostat  Depression?  - has been on Cymbalta at home which was being held-   resumed on 7/13  Dysphagia  - D 2 diet with nectar thick liquids   DVT prophylaxis: Heparin Code Status: Full code Family Communication: mother Disposition Plan: SNF  Consultants:   PCCM    Nephrology  Palliative care consult   Orthopedics,  Procedures:  Lower extremity ultrasound 5/18 >> no evidence of DVT CT head 5/19 >> normal exam Echo 5/20 >>EF 30-35%, increased LVF. Diffuse hypokinesis, akinesis of basilar mid-inferior myocardium, grade 2 diastolic dysfxn  EEG 5/20 >>finding c/w mod to severe global cerebral dysfxn. C/w anoxic injury given clinical course  LE [place] 5/21 >> negative MRI brain 5/24 >> ?thrombosed cortical vein, otherwise normal TEE 6/4 >>mild MR, normal AV, mild TR, mild PR, EF 30-35%, diffuse hypokinesis, no thrombus, no PFO, normal RV, moderate pericardial effusion with synechia suggesting some chronicity, no vegetations   Antimicrobials:  Anti-infectives    Start     Dose/Rate Route Frequency Ordered Stop   04/13/17 1000  fluconazole (DIFLUCAN) tablet 100 mg     100 mg Oral Daily 04/12/17 1356 04/15/17 0959   04/08/17 1000  fluconazole (DIFLUCAN) IVPB 100 mg  Status:  Discontinued     100 mg 50 mL/hr over 60 Minutes Intravenous  Every 24 hours 04/08/17 0851 04/12/17 1356   04/04/17 0600  ceFAZolin (ANCEF) IVPB 2g/100 mL premix  Status:  Discontinued     2 g 200 mL/hr over 30 Minutes Intravenous On call to O.R. 04/03/17 1840 04/03/17 1848   03/28/17 1600  cefTAZidime (FORTAZ) 1 g in dextrose 5 % 50 mL IVPB  Status:  Discontinued     1 g 100 mL/hr over 30 Minutes Intravenous Every 24 hours 03/27/17 0814 03/28/17 0752   03/28/17 1100  piperacillin-tazobactam (ZOSYN) IVPB 3.375 g     3.375 g 12.5 mL/hr over 240 Minutes Intravenous Every 12 hours 03/28/17 1038 04/11/17 1359   03/27/17 0815  cefTAZidime (FORTAZ) 1 g in dextrose 5 % 50 mL IVPB     1 g 100 mL/hr over 30 Minutes Intravenous  Once 03/27/17 0814 03/27/17 1026   03/17/17 1800  cefTAZidime (FORTAZ) 2 g in dextrose 5 % 50 mL IVPB  Status:  Discontinued     2 g 100 mL/hr over 30 Minutes Intravenous Every T-Th-Sa (1800) 03/15/17 1143 03/18/17 1223   03/17/17 1200  vancomycin (VANCOCIN) IVPB 1000 mg/200 mL premix  Status:  Discontinued     1,000 mg 200 mL/hr over 60 Minutes Intravenous Every T-Th-Sa (Hemodialysis) 03/15/17 1143 03/18/17 1223   03/14/17 1200  vancomycin (VANCOCIN) IVPB 1000 mg/200 mL premix  Status:  Discontinued     1,000 mg 200 mL/hr over 60 Minutes Intravenous Every T-Th-Sa (Hemodialysis) 03/12/17 0912 03/14/17 1542   03/13/17 1430  ceFAZolin (ANCEF) IVPB 2g/100 mL premix  Status:  Discontinued     2 g 200 mL/hr over 30 Minutes Intravenous To ShortStay Surgical 03/12/17 1208 03/13/17 1113   03/13/17 1115  ceFAZolin (ANCEF) IVPB 2g/100 mL premix     2 g 200 mL/hr over 30 Minutes Intravenous To Surgery 03/13/17 1108 03/13/17 1506   03/12/17 1800  cefTAZidime (FORTAZ) 2 g in dextrose 5 % 50 mL IVPB  Status:  Discontinued     2 g 100 mL/hr over 30 Minutes Intravenous Every T-Th-Sa (1800) 03/12/17 0912 03/14/17 1542   03/12/17 1200  vancomycin (VANCOCIN) 1,500 mg in sodium chloride 0.9 % 250 mL IVPB     1,500 mg 250 mL/hr over 60 Minutes  Intravenous Every Thu (Hemodialysis) 03/12/17 0912 03/12/17 1356   03/02/17 1200  cefTRIAXone (ROCEPHIN) 2 g in dextrose 5 % 50 mL IVPB     2 g 100 mL/hr over 30 Minutes Intravenous Every 24 hours 03/02/17 0810 03/06/17 1358   03/02/17 1200  vancomycin (VANCOCIN) IVPB 1000 mg/200 mL premix     1,000 mg 200 mL/hr over 60 Minutes Intravenous Every M-W-F (Hemodialysis) 03/02/17 0815 03/02/17 1457   03/02/17 1200  metroNIDAZOLE (FLAGYL) IVPB 500 mg     500 mg 100 mL/hr over 60 Minutes Intravenous Every 8 hours 03/02/17 0948 03/06/17 2230   02/26/17 1200  vancomycin (VANCOCIN) IVPB 1000 mg/200 mL premix     1,000 mg 200 mL/hr over 60 Minutes Intravenous Every T-Th-Sa (Hemodialysis) 02/25/17 1151 03/05/17 1325   02/25/17 1200  vancomycin (VANCOCIN) 2,000 mg in sodium chloride 0.9 % 500 mL IVPB     2,000 mg 250 mL/hr over 120 Minutes Intravenous  Once 02/25/17 1149 02/25/17 1449   02/25/17 0900  cefTRIAXone (ROCEPHIN) 2 g in dextrose 5 % 50 mL IVPB  Status:  Discontinued     2 g 100 mL/hr over 30 Minutes Intravenous Every 24 hours 02/25/17 0814 03/02/17 0810   02/25/17 0900  metroNIDAZOLE (FLAGYL) IVPB 500 mg  Status:  Discontinued     500 mg 100 mL/hr over 60 Minutes Intravenous Every 8 hours 02/25/17 0814 03/02/17 0948   02/20/17 1200  vancomycin (VANCOCIN) IVPB 1000 mg/200 mL premix  Status:  Discontinued     1,000 mg 200 mL/hr over 60 Minutes Intravenous Every M-W-F (Hemodialysis) 02/19/17 1506 02/20/17 1403   02/17/17 1800  meropenem (MERREM) 500 mg in sodium chloride 0.9 % 50 mL IVPB  Status:  Discontinued     500 mg 100 mL/hr over 30 Minutes Intravenous Daily-1800 02/17/17 1026 02/25/17 0814   02/17/17 1200  vancomycin (VANCOCIN) IVPB 1000 mg/200 mL premix     1,000 mg 200 mL/hr over 60 Minutes Intravenous Every T-Th-Sa (Hemodialysis) 02/14/17 1758 02/19/17 1612   02/14/17 2200  clindamycin (CLEOCIN) IVPB 900 mg  Status:  Discontinued     900 mg 100 mL/hr over 30 Minutes Intravenous  Every 8 hours 02/14/17 1106 02/14/17 1928   02/14/17 2000  clindamycin (CLEOCIN) IVPB 900 mg  Status:  Discontinued     900 mg 100 mL/hr over 30 Minutes Intravenous Every 8 hours 02/14/17 1928 02/16/17 1047   02/14/17 1915  piperacillin-tazobactam (ZOSYN) IVPB 3.375 g  Status:  Discontinued     3.375 g 100 mL/hr over 30 Minutes Intravenous Every 12 hours 02/14/17 1801 02/17/17 1026  02/14/17 1845  vancomycin (VANCOCIN) 2,000 mg in sodium chloride 0.9 % 500 mL IVPB     2,000 mg 250 mL/hr over 120 Minutes Intravenous  Once 02/14/17 1758 02/14/17 2107   02/14/17 1115  clindamycin (CLEOCIN) IVPB 900 mg  Status:  Discontinued     900 mg 100 mL/hr over 30 Minutes Intravenous  Once 02/14/17 1106 02/14/17 2236   02/14/17 0945  clindamycin (CLEOCIN) IVPB 600 mg     600 mg 100 mL/hr over 30 Minutes Intravenous  Once 02/14/17 0931 02/14/17 1013       Objective: Vitals:   04/25/17 1651 04/25/17 2227 04/26/17 0548 04/26/17 0900  BP: 137/85 (!) 151/92 (!) 149/99 (!) 146/85  Pulse: (!) 103 (!) 113  (!) 124  Resp: 14 18  18   Temp: (!) 97.5 F (36.4 C) 98.7 F (37.1 C) 98.8 F (37.1 C) 98.3 F (36.8 C)  TempSrc: Oral Oral Oral Oral  SpO2: 98% 95% 96% 94%  Weight:  77 kg (169 lb 12.1 oz)    Height:        Intake/Output Summary (Last 24 hours) at 04/26/17 1320 Last data filed at 04/25/17 1651  Gross per 24 hour  Intake                0 ml  Output             2000 ml  Net            -2000 ml   Filed Weights   04/23/17 2025 04/24/17 2106 04/25/17 2227  Weight: 76 kg (167 lb 8.8 oz) 79 kg (174 lb 2.6 oz) 77 kg (169 lb 12.1 oz)    Examination: General exam: Appears comfortable - sleepy  HEENT: PERRLA, oral mucosa moist Respiratory system: Clear to auscultation. Respiratory effort normal. Cardiovascular system: S1 & S2 heard, RRR.  No murmurs, tachycardic Gastrointestinal system: Abdomen soft, non-tender, nondistended. Normal bowel sound. No organomegaly Central nervous system: Alert  - moving arms and legs - normal tone in extermities Extremities: No cyanosis, clubbing or edema- left AKA  - incision is clean Skin:  - right heel eschar Psych: flat affect, poor communication    Data Reviewed: I have personally reviewed following labs and imaging studies  CBC:  Recent Labs Lab 04/21/17 0800 04/23/17 1554 04/25/17 1247  WBC 7.2 7.9 7.9  HGB 9.4* 9.4* 9.1*  HCT 30.8* 30.7* 30.7*  MCV 95.1 95.0 96.5  PLT 365 326 276   Basic Metabolic Panel:  Recent Labs Lab 04/21/17 0800 04/23/17 1554 04/25/17 1248  NA 132* 131* 131*  K 3.8 4.2 4.0  CL 92* 94* 94*  CO2 26 26 26   GLUCOSE 90 152* 128*  BUN 49* 45* 41*  CREATININE 5.31* 5.16* 4.61*  CALCIUM 8.9 8.9 9.1  PHOS 6.1* 5.8* 4.5   GFR: Estimated Creatinine Clearance: 17.2 mL/min (A) (by C-G formula based on SCr of 4.61 mg/dL (H)). Liver Function Tests:  Recent Labs Lab 04/21/17 0800 04/23/17 1554 04/25/17 1248  ALBUMIN 2.4* 2.4* 2.5*   No results for input(s): LIPASE, AMYLASE in the last 168 hours. No results for input(s): AMMONIA in the last 168 hours. Coagulation Profile: No results for input(s): INR, PROTIME in the last 168 hours. Cardiac Enzymes: No results for input(s): CKTOTAL, CKMB, CKMBINDEX, TROPONINI in the last 168 hours. BNP (last 3 results) No results for input(s): PROBNP in the last 8760 hours. HbA1C: No results for input(s): HGBA1C in the last 72 hours. CBG:  Recent Labs  Lab 04/25/17 1234 04/25/17 1717 04/25/17 2232 04/26/17 0759 04/26/17 1218  GLUCAP 136* 95 158* 155* 116*   Lipid Profile: No results for input(s): CHOL, HDL, LDLCALC, TRIG, CHOLHDL, LDLDIRECT in the last 72 hours. Thyroid Function Tests: No results for input(s): TSH, T4TOTAL, FREET4, T3FREE, THYROIDAB in the last 72 hours. Anemia Panel: No results for input(s): VITAMINB12, FOLATE, FERRITIN, TIBC, IRON, RETICCTPCT in the last 72 hours. Urine analysis:    Component Value Date/Time   COLORURINE AMBER (A)  02/14/2017 2111   APPEARANCEUR HAZY (A) 02/14/2017 2111   LABSPEC 1.015 02/14/2017 2111   PHURINE 5.0 02/14/2017 2111   GLUCOSEU NEGATIVE 02/14/2017 2111   HGBUR SMALL (A) 02/14/2017 2111   BILIRUBINUR NEGATIVE 02/14/2017 2111   KETONESUR NEGATIVE 02/14/2017 2111   PROTEINUR 30 (A) 02/14/2017 2111   UROBILINOGEN 0.2 09/21/2014 1908   NITRITE NEGATIVE 02/14/2017 2111   LEUKOCYTESUR TRACE (A) 02/14/2017 2111   Sepsis Labs: @LABRCNTIP (procalcitonin:4,lacticidven:4) )No results found for this or any previous visit (from the past 240 hour(s)).       Radiology Studies: No results found.    Scheduled Meds: . carvedilol  6.25 mg Oral BID WC  . darbepoetin (ARANESP) injection - DIALYSIS  200 mcg Intravenous Q Thu-HD  . dextrose  1 ampule Intravenous Once  . docusate sodium  100 mg Oral BID  . DULoxetine  30 mg Oral Daily  . feeding supplement (PRO-STAT SUGAR FREE 64)  30 mL Oral TID  . [name]'s butt cream   Topical QID  . heparin  5,000 Units Subcutaneous Q8H  . insulin aspart  0-15 Units Subcutaneous TID WC  . insulin glargine  8 Units Subcutaneous Daily  . mouth rinse  15 mL Mouth Rinse BID  . multivitamin  1 tablet Oral QHS  . sevelamer carbonate  1.6 g Oral TID WC  . sodium chloride flush  3 mL Intravenous Q12H   Continuous Infusions: . sodium chloride    . sodium chloride       LOS: 71 days    Time spent in minutes: 35    Calvert Cantor, MD [facility] Pager: [email or web address] Password [email or web address] 04/26/2017, 1:20 PM

## 2017-04-26 NOTE — Progress Notes (Signed)
Subjective:   HD yest tolerated  On schedule   Objective Vital signs in last 24 hours: Vitals:   04/25/17 1651 04/25/17 2227 04/26/17 0548 04/26/17 0900  BP: 137/85 (!) 151/92 (!) 149/99 (!) 146/85  Pulse: (!) 103 (!) 113  (!) 124  Resp: 14 18  18   Temp: (!) 97.5 F (36.4 C) 98.7 F (37.1 C) 98.8 F (37.1 C) 98.3 F (36.8 C)  TempSrc: Oral Oral Oral Oral  SpO2: 98% 95% 96% 94%  Weight:  77 kg (169 lb 12.1 oz)    Height:       Weight change: -2 kg (-4 lb 6.5 oz)  Physical Exam: GEN  Awoken from sleep ,Alert, NAD HEENT: prior trach site healing PULM: normal WOB, clear bilaterally Card - Reg . tachycardic  ABD obese, soft and non tender/ NT  EXT: Left AKA dressed, trace edema L thigh and R pedal with  Right foot in protective boot Dialysis Access: Left AVF + bruit and thrill  Dialysis:TTS [facility] 4h F200 114kg ( NOW  In hosp EDW ~~,78.5- )2/2 bath LUA AVF Hep none - Iron Sucrose (Venofer) 50 mg IVP During Dialysis 1X Week  - Mircera 75 mcg IV q 2 weeks ( last on 02/05/17) - Vitamin D (Calcitriol) Oral0.75 mcg Po q hd   Summary: 36 year old female w/ ESRD d/t poorly controlled DM, HD bilateral TMA's, obesity, HTN, medical non-compliance, NICM. Admitted 5/19 w/left leg pain, + radiological evidence of osteomyelitis w/wound nonhealing, dehiscence, purulent drainage. Admitted for IV antibiotics, hemodialysis and possible surgery. Found unresponsive in her room that afternoon - cardiac arrest >rec'd CPR, epi bicarb; intubated; cooled. Then developed anoxic brain injury, trach and s/p L AKA this hosp. Course further complicated by recurrent aspiration PNA, dehiscence of AK wound requiring revision 7/6. Now decannulated, ABI improving, and pt stable and ready for discharge pending choosing a SNF.   Impression/Plan: 1. Anoxic brain injury: s/p cardiac arrest. Improved cognition.  2. Resp failure, sp trach:decannulated 7/16. 3. ESRD:TTS HD. Continue normal schedule.  No heparin 4. Debility: cannot sit up by herself and can't sit in a WC.Not able to dc due to inherent OP HD in-center and HD transportation requirements she cannot meet w/ her significant physical disabilities.  5. HTN/ volume:On low-dose coreg. Required midodrine before, now d/c'd.Need to try to avoid overtreating BP's since she was having some orthostatic drops affecting PT.  Not removing any fluid w HD as well for now. 6.25mg  bid coreg  6. Anemia Of ESRD : HGB 9.1, Darbopoetin 200 q week. Thurs / Repeat iron panel with Tsat 36% 7/19, no need for IV iron 7. Bone/ Mineral: Renal diet, no binder, calcitriol d/c'd due to increasing Ca. PTH 88 here. If still here next week, need to repeat PTH. yest. Ca 9.1 Corec = 10.3 /  Phos= ,4.5<5.8 <6.1 Re Started binder= Renvela =on prior as OP  8. 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 S/p wound vac  9. DM 2:per primary 10. Chronic systolic CHF: EF 16-10%;  As above avoid large uf  To min sec bp dropping now off midodrine  11. Nutrition: albumin 2.2.>2.4>2.5  Taking prostat.  Lenny Pastel, PA-C [facility] Beeper [phone] 04/26/2017,11:15 AM  LOS: 71 days   Pt seen, examined and agree w A/P as above.  Vinson Moselle MD [employer] pager [phone]   04/26/2017, 1:15 PM     Labs: Basic Metabolic Panel:  Recent Labs Lab 04/21/17 0800  04/23/17 1554 04/25/17 1248  NA 132* 131* 131*  K 3.8 4.2 4.0  CL 92* 94* 94*  CO2 26 26 26   GLUCOSE 90 152* 128*  BUN 49* 45* 41*  CREATININE 5.31* 5.16* 4.61*  CALCIUM 8.9 8.9 9.1  PHOS 6.1* 5.8* 4.5   Liver Function Tests:  Recent Labs Lab 04/21/17 0800 04/23/17 1554 04/25/17 1248  ALBUMIN 2.4* 2.4* 2.5*   No results for input(s): LIPASE, AMYLASE in the last 168 hours. No results for input(s): AMMONIA in the last 168 hours. CBC:  Recent Labs Lab 04/21/17 0800 04/23/17 1554 04/25/17 1247  WBC 7.2 7.9 7.9  HGB 9.4* 9.4* 9.1*  HCT 30.8*  30.7* 30.7*  MCV 95.1 95.0 96.5  PLT 365 326 276   Cardiac Enzymes: No results for input(s): CKTOTAL, CKMB, CKMBINDEX, TROPONINI in the last 168 hours. CBG:  Recent Labs Lab 04/25/17 0800 04/25/17 1234 04/25/17 1717 04/25/17 2232 04/26/17 0759  GLUCAP 116* 136* 95 158* 155*    Studies/Results: No results found. Medications: . sodium chloride    . sodium chloride     . carvedilol  6.25 mg Oral BID WC  . darbepoetin (ARANESP) injection - DIALYSIS  200 mcg Intravenous Q Thu-HD  . dextrose  1 ampule Intravenous Once  . docusate sodium  100 mg Oral BID  . DULoxetine  30 mg Oral Daily  . feeding supplement (PRO-STAT SUGAR FREE 64)  30 mL Oral TID  . [name]'s butt cream   Topical QID  . heparin  5,000 Units Subcutaneous Q8H  . insulin aspart  0-15 Units Subcutaneous TID WC  . insulin glargine  8 Units Subcutaneous Daily  . mouth rinse  15 mL Mouth Rinse BID  . multivitamin  1 tablet Oral QHS  . sevelamer carbonate  1.6 g Oral TID WC  . sodium chloride flush  3 mL Intravenous Q12H

## 2017-04-27 LAB — GLUCOSE, CAPILLARY
GLUCOSE-CAPILLARY: 148 mg/dL — AB (ref 65–99)
Glucose-Capillary: 82 mg/dL (ref 65–99)
Glucose-Capillary: 87 mg/dL (ref 65–99)
Glucose-Capillary: 121 mg/dL — ABNORMAL HIGH (ref 65–99)

## 2017-04-27 MED ORDER — NEPRO/CARBSTEADY PO LIQD: 237.0000 mL | Freq: Two times a day (BID) | ORAL | Status: DC | PRN

## 2017-04-27 NOTE — Progress Notes (Signed)
SLP Cancellation Note  Patient Details Name: [name] MRN: [id number] DOB: 03/29/1981   Cancelled treatment:        PT/OT just starting treatment when ST arrived. Will continue efforts.   [name] 04/27/2017, 2:51 PM  [name] [name].Ed [employer] [phone]

## 2017-04-27 NOTE — Progress Notes (Signed)
Physical Therapy Treatment Patient Details Name: [name] MRN: [id number] DOB: 22-Feb-1981 Today's Date: 04/27/2017    History of Present Illness [name] is a 36 y.o. 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 ETT 5/19 and trach'd 5/31. Trach removed 7/16.    PT Comments    Pt is progressing towards goals, limited by fatigue and decreased functional mobility. Pt vitals remained stable during supine to sit transfer. She was able to use her UEs to do a majority of the work; however, lost trunk stability and control when seated upright and required max physical assistance to support. Pt required min guard to mod assist during seated balance, and was able to perform basic ADL with OT present. Muscle activation was performed through feed forward weight shifts. She was educated on updated weight bearing status on RLE, as wound had been healed and closed. Pt was instructed to use RLE to assist in slide transfer to chair with slide board, but could not reach the ground. Pt will benefit from continued skilled PT to regain strength and function, and is progressing toward d/c plan to a SNF.   She has been out of bed and in the chair as of 15:26.     Follow Up Recommendations  SNF     Equipment Recommendations   Will consider slide board, other equipment will be recommended at next venue of care.    Recommendations for Other Services       Precautions / Restrictions Precautions Precautions: Fall Restrictions Weight Bearing Restrictions: Yes LLE Weight Bearing: Non weight bearing Other Position/Activity Restrictions: Per Dr. [name] RLE FWB if wound is fully closed/healed    Mobility  Bed  Mobility Overal bed mobility: Needs Assistance Bed Mobility: Rolling;Supine to Sit Rolling: Min assist   Supine to sit: Mod assist     General bed mobility comments: step by step cues, increased time and physical assistance needed to sit up and bring hips to EOB.  Transfers Overall transfer level: Needs assistance Equipment used: 2 person hand held assist Transfers: Lateral/Scoot Transfers          Lateral/Scoot Transfers: With slide board;+2 safety/equipment;+2 physical assistance;Max assist General transfer comment: pt was cued to use RLE to weight bear and assist with transfer, but she had difficulty reaching her R residual foot to the floor. Attempted to use UEs for slide transfer, but not enough strength/control thereby requiring more physical assist.   Ambulation/Gait                 Stairs            Wheelchair Mobility    Modified Rankin (Stroke Patients Only)       Balance Overall balance assessment: Needs assistance Sitting-balance support: Bilateral upper extremity supported;Feet unsupported Sitting balance-Leahy Scale: Poor Sitting balance - Comments: On initial supine to sit transfer, pt was unable to control trunk and required mod assist +2 for support. After remaining seated for 10+ minutes, pt was able to organize and control her trunk musculature to remain upright with min guard +2 for safety. Pt was able to perform feed forward weight shifts in A/P direction and bilaterally for muscle activation while maintaing  BiUE support. Mod - max assist was required for lateral weight shift/elbow prop to place slide board under hips.                                      Cognition Arousal/Alertness: Lethargic Behavior During Therapy: WFL for tasks assessed/performed;Flat affect Overall Cognitive Status: Impaired/Different from baseline Area of Impairment: Orientation;Attention;Memory;Following commands;Awareness                    Current Attention Level: Sustained Memory: Decreased short-term memory Following Commands: Follows one step commands with increased time Safety/Judgement: Decreased awareness of safety;Decreased awareness of deficits   Problem Solving: Slow processing;Decreased initiation;Requires verbal cues;Requires tactile cues General Comments: Pt is following one step instructions. Difficulty recalling recent information or answering open ended questions, easily distracted by external stimuli.      Exercises      General Comments        Pertinent Vitals/Pain Pain Assessment: 0-10 Pain Score: 2  Pain Location: R LE after abrupt movement of leg  Orthostatics:  BP- Lying: 152/84 Pulse- Lying: 111 BP- Sitting: 141/92 Pulse- Sitting: 112    Home Living                      Prior Function            PT Goals (current goals can now be found in the care plan section) Progress towards PT goals: Progressing toward goals (improvements in seated balance, only min guard at times )    Frequency    Min 3X/week      PT Plan Current plan remains appropriate    Co-evaluation PT/OT/SLP Co-Evaluation/Treatment: Yes Reason for Co-Treatment: Complexity of the patient's impairments (multi-system involvement);Necessary to address cognition/behavior during functional activity;For patient/therapist safety;To address functional/ADL transfers          AM-PAC PT "6 Clicks" Daily Activity  Outcome Measure  Difficulty turning over in bed (including adjusting bedclothes, sheets and blankets)?: Total Difficulty moving from lying on back to sitting on the side of the bed? : Total Difficulty sitting down on and standing up from a chair with arms (e.g., wheelchair, bedside commode, etc,.)?: Total Help needed moving to and from a bed to chair (including a wheelchair)?: Total Help needed walking in hospital room?: Total Help needed climbing 3-5 steps with a railing? : Total 6 Click Score:  6    End of Session Equipment Utilized During Treatment: Other (comment) (slide board, PRAFO RLE) Activity Tolerance: Patient tolerated treatment well;Patient limited by lethargy Patient left: in chair;with call bell/phone within reach;with family/visitor present;Other (comment) (with PRAFO RLE)   PT Visit Diagnosis: Muscle weakness (generalized) (M62.81);Other abnormalities of gait and mobility (R26.89) Pain - Right/Left: Right Pain - part of body: Ankle and joints of foot     Time: 1445-1526 PT Time Calculation (min) (ACUTE ONLY): 41 min  Charges:  $Therapeutic Activity: 23-37 mins                    G Codes:       Callie Fielding, SPT Acute Rehabilitation Services Office: [phone]    Callie Fielding 04/27/2017, 4:22 PM

## 2017-04-27 NOTE — Plan of Care (Signed)
Problem: Activity: Goal: Risk for activity intolerance will decrease Outcome: Progressing Pt got up with physical therapy in recliner. Encourage to sit up in chair for a few hours and eat dinner in chair

## 2017-04-27 NOTE — Clinical Social Work Note (Addendum)
CSW contacted admissions director at [facility], Cherly Beach and provided update regarding patient sitting in wheelchair on 7/29 from 12:30 pm - 4 pm and also sitting in wheelchair on 7/27 and 7/28. Cherly Beach advised that she will come to hospital to see patient and then talk with CSW. As of the writing of this note, contact has not been made. Call made to admissions director and message left requesting that she call CSW.  Genelle Bal, MSW, LCSW Licensed Clinical Social Worker Clinical Social Work Department [employer] [phone]

## 2017-04-27 NOTE — Progress Notes (Signed)
[facility] Progress Note   Subjective:  Seen in room. No overnight events. No CP/dyspnea.  Objective Vitals:   04/26/17 1741 04/26/17 2122 04/27/17 0431 04/27/17 0800  BP: (!) 148/98 (!) 120/101 129/89 131/80  Pulse: (!) 111 (!) 102 (!) 114 (!) 112  Resp: 18 16 18 16   Temp: 98.8 F (37.1 C) 98.8 F (37.1 C) 98.5 F (36.9 C) 98.1 F (36.7 C)  TempSrc: Oral   Oral  SpO2: 98% 99% 96% 99%  Weight:  75.8 kg (167 lb)    Height:       Physical Exam General: Awake/alert; NAD. Heart: RRR; no murmur Lungs: Expiratory coarse wheezing throughout, no rales Abdomen: Soft, non-tender Extremities: L AKA, RLE without edema. Dialysis Access: LUE AVF + thrill/bruit  Additional Objective Labs: Basic Metabolic Panel:  Recent Labs Lab 04/21/17 0800 04/23/17 1554 04/25/17 1248  NA 132* 131* 131*  K 3.8 4.2 4.0  CL 92* 94* 94*  CO2 26 26 26   GLUCOSE 90 152* 128*  BUN 49* 45* 41*  CREATININE 5.31* 5.16* 4.61*  CALCIUM 8.9 8.9 9.1  PHOS 6.1* 5.8* 4.5   Liver Function Tests:  Recent Labs Lab 04/21/17 0800 04/23/17 1554 04/25/17 1248  ALBUMIN 2.4* 2.4* 2.5*   CBC:  Recent Labs Lab 04/21/17 0800 04/23/17 1554 04/25/17 1247  WBC 7.2 7.9 7.9  HGB 9.4* 9.4* 9.1*  HCT 30.8* 30.7* 30.7*  MCV 95.1 95.0 96.5  PLT 365 326 276   CBG:  Recent Labs Lab 04/26/17 0759 04/26/17 1218 04/26/17 1743 04/26/17 2114 04/27/17 0830  GLUCAP 155* 116* 140* 96 82   Medications: . sodium chloride    . sodium chloride     . carvedilol  6.25 mg Oral BID WC  . darbepoetin (ARANESP) injection - DIALYSIS  200 mcg Intravenous Q Thu-HD  . dextrose  1 ampule Intravenous Once  . docusate sodium  100 mg Oral BID  . DULoxetine  30 mg Oral Daily  . feeding supplement (PRO-STAT SUGAR FREE 64)  30 mL Oral TID  . [name]'s butt cream   Topical QID  . heparin  5,000 Units Subcutaneous Q8H  . insulin aspart  0-15 Units Subcutaneous TID WC  . insulin glargine  8 Units  Subcutaneous Daily  . mouth rinse  15 mL Mouth Rinse BID  . multivitamin  1 tablet Oral QHS  . sevelamer carbonate  1.6 g Oral TID WC  . sodium chloride flush  3 mL Intravenous Q12H    Dialysis Orders: TTS [facility] 4h F200 114kg ( NOW In hosp EDW ~78.5kg)2/2 bath LUA AVF Hep none - Iron Sucrose (Venofer) 50 mg IVP weekly - Mircera 75 mcg IV q 2 weeks (last on 02/05/17) - Calcitriol 0.75 mcg PO q HD  Summary: 36 year old female w/ ESRD d/t poorly controlled DM, HD bilateral TMA's, obesity, HTN, medical non-compliance, NICM. Admitted 5/19 w/left leg pain, + radiological evidence of osteomyelitis w/wound nonhealing, dehiscence, purulent drainage. Admitted for IV antibiotics, hemodialysis and possible surgery. Found unresponsive in her room that afternoon - cardiac arrest >rec'd CPR, epi bicarb; intubated; cooled. Then developed anoxic brain injury, trach and s/p L AKA this hosp. Course further complicated by recurrent aspiration PNA, dehiscence of AK wound requiring revision 7/6. Now decannulated, ABI improving, and pt stable and ready for discharge pending choosing a SNF.   Assessment/Plan: 1. Anoxic brain injury: s/p cardiac arrest, ongoing improvement in cognition. 2. Resp failure (s/p trach, now decannulated 7/16): No issues. 3. ESRD:Will  continue TTS schedule, no heparin. Next HD 7/31. 4. Debility: Working with PT to sit for 4-5 hour periods for outpt HD. D/c delayed due to transportation requirements she cannot meet w/ her significant physical disabilities.  5. HTN/ volume:On low-dose coreg 6.25mg  BID. Required midodrine before, now d/c'd.Need to try to avoid overtreating BP's since she was having some orthostatic drops affecting PT.  6. Anemia of ESRD:Hgb 9.1, on Aranesp weekly. Last Tsat 36% on 7/19, no need for IV iron. 7. Secondary hyperparathyroidism: Calcitriol has been on hold due to higher Ca, last PTH 88 (03/23/17). [name] was previously on hold, now  resumed d/t higher Phos. Monitor. 8. 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 S/p wound vac. 9. Type 2 DM:On insulin, per primary. 10. Chronic systolic CHF: EF 16-10%. 11. Nutrition: Alb 2.5, continue Pro-stat.  Ozzie Hoyle, PA-C 04/27/2017, 9:18 AM  [employer] Pager: [phone]

## 2017-04-27 NOTE — Progress Notes (Signed)
Nutrition Follow-up  DOCUMENTATION CODES:   Obesity unspecified  INTERVENTION:   -Nepro ordered BID PRN for any missed meals due to HD or pt meal completion <50%  -Also receiving automatic scheduled bedtime snack on renal diet  -Pro-Stat TID  -Continue Rena-Vit  NUTRITION DIAGNOSIS:   Increased nutrient needs related to wound healing as evidenced by estimated needs.  Conitnues  GOAL:   Patient will meet greater than or equal to 90% of their needs  Progressing  MONITOR:   PO intake, Supplement acceptance, Diet advancement, Labs, Weight trends, Skin, I & O's  REASON FOR ASSESSMENT:   Consult Enteral/tube feeding initiation and management  ASSESSMENT:   36 yo Female w/ ESRD d/t poorly controlled DM, bilateral transmetatarsal foot amputations, obesity, HTN, medical non-compliance as well as NICM. Admitted with complaints of left leg pain, found to have radiological evidence of osteomyelitis with wound nonhealing, dehiscence, purulent drainage from the wound. Admitted for IV antibiotics, hemodialysis and possible surgery. Pt with nonhealing L foot wound, amputation recommended.   Discharge delayed due to pt unable to meet transportation requires to HD Pt continues with scheduled HD (TTS), next HD 7/31 Recorded po intake 75-100% of meals, Pro-Stat 30 mL TID continues  Weight has trended down since admission; pt only net +800 mL. Dry weight on admission of 114 kg, now estimated at 75.8 kg. Noted pt with L AKA since admission but would not account for all of patient's weight loss  Labs: sodium 131, CBGs 82-158, phosphorus wdl, potassium wdl, corrected calcium 10.3 (H) Meds: ss novolog, lantus, Rena-Vit, renvela  Diet Order:  Diet renal/carb modified with fluid restriction Diet-HS Snack? Nothing; Room service appropriate? Yes; Fluid consistency: Thin Diet - low sodium heart healthy  Skin:  Wound (see comment) (stage II anus, unstageable heel, left AKA)  Last BM:   7/23  Height:   Ht Readings from Last 1 Encounters:  04/15/17 5' 8.5" (1.74 m)    Weight:   Wt Readings from Last 1 Encounters:  04/26/17 167 lb (75.8 kg)    Ideal Body Weight:  63.6 kg  BMI:  Body mass index is 25.02 kg/m.  Estimated Nutritional Needs:   Kcal:  2100-2300  Protein:  110-130 grams  Fluid:  Per MD  EDUCATION NEEDS:   No education needs identified at this time  Romelle Starcher MS, RD, LDN [phone] Pager  [phone] Weekend/On-Call Pager

## 2017-04-27 NOTE — Progress Notes (Signed)
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   Admitted on 02/14/17 to [facility] with left foot/leg pain, wound dehiscence and purulent drainage from the left trans-metatarsal amputation site.  She was subsequently found to be unresponsive on 5/19. Noted to bein asystole and CPR started with ROSC in approximately 9 minutes. She was intubated by anaesthesia, transferred to the intensive care unit and subsequently underwent cooling protocol. She couldn't be weaned off the ventilator and as a result underwent tracheostomy on 5/31. She underwent decannulation on 7/16.  Neurological/ Cognitive status has been poor. She has been evaluated by neurology on 5/29 and, per neurology, chances of meaningful neurological recovery appeared to be dismal.  Currently she is able to follow simple commands and is alert and is eating.  Hospital course complicated by persistent leukocytosis, respiratory failure and left transmetatarsal wound dehiscence with gangrene. 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  Over the course of her hospitalizations, she has been treated for multiple episodes of recurrent aspiration pneumonias.  Currently off antibiotics and awaiting placement.  The patient is unable to sit in a normal wheel chair to be transported to the HD due to poor truncal stability.  She cannot be discharged until she is able to sit in a normal wheel chair(without sliding down) for transport to HD.    Subjective: No complaints. Sleeping and difficult to awaken.  ROS: no complaints of nausea, vomiting, constipation diarrhea, cough, dyspnea or dysuria. No other  complaints.   Assessment & Plan:  Acute /chronichypoxemic vent dependent respiratory failure in a setting of cardiac arrest Aspiration pneumonia- RLL  Tracheostomy  - Intubated 6/19-6/20.  5/31- tracheostomy- - PCCM following  - S/P MBS 6/23. Now status post FEES 6/27. Patient started on nectar thick liquids and dysphagia 1 diet 6/27. Holding off on PEG   - has had aspiration issues- recurrent aspiration on 6/28-  Repeat chest x-ray on 6/29 showed new right-sided infiltrate - She was started on Fortaz on 6/29 and changed over to Zosyn on 6/30   - Chest x-ray from 7/1 showed improving opacity in the right lung. 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  - Orthopedics removed wound VAC 6/20 - revision of her left AKA  04/03/17  with reapplication of wound vac which was subsequently removed. -  wound remains clean   Right heel eschar - wound care RN following - Prevalon boot  Ulcers on sacral area- incontinence associated dermatitis - WOC   - barrier cream - air mattress    Diabetes mellitus 2 - cont Lantus 8 U and SSI with meals  Thrush - resolved with treatment with Diflucan and  Nystatin  Anoxic brain injury secondary to Cardiac arrest on 5/19: - slow cognitive improvement- following commands, talking & feeding herself now  Diarrhea  - resolved    Normocytic anemia Likely secondary to chronic disease-probably worsened by acute illness.  No signs of bleeding, hemoglobin has been stable with some fluctuations.   Diabetes mellitus type 2  Continue with SSI. On Lantus. HbA1c 5.9.   Chronic systolic heart failure/nonischemic cardiomyopathy - Echocardiogram>> EF 25-30% with diffuse hypokinesis - volume management with dialysis - cont Coreg as tolerated  Hypotension/ HTN - Midodrine with dialysis to be discontinued  due to resolution of hypotension - using Coreg now    Pericardial effusion Seen on TEE- no evidence of tamponade pathophysiology. Repeat echo 6/26 with no significant pericardial effusion.   Moderate protein calorie malnutrition - taking orals well now- cont Prostat  Depression?  - has been on Cymbalta at home which was being held-   resumed on 7/13  Dysphagia  - D 2 diet with nectar thick liquids   DVT prophylaxis: Heparin Code Status: Full code Family Communication: mother Disposition Plan: SNF  Consultants:   PCCM    Nephrology  Palliative care consult   Orthopedics,  Procedures:  Lower extremity ultrasound 5/18 >> no evidence of DVT CT head 5/19 >> normal exam Echo 5/20 >>EF 30-35%, increased LVF. Diffuse hypokinesis, akinesis of basilar mid-inferior myocardium, grade 2 diastolic dysfxn  EEG 5/20 >>finding c/w mod to severe global cerebral dysfxn. C/w anoxic injury given clinical course  LE [place] 5/21 >> negative MRI brain 5/24 >> ?thrombosed cortical vein, otherwise normal TEE 6/4 >>mild MR, normal AV, mild TR, mild PR, EF 30-35%, diffuse hypokinesis, no thrombus, no PFO, normal RV, moderate pericardial effusion with synechia suggesting some chronicity, no vegetations   Antimicrobials:  Anti-infectives    Start     Dose/Rate Route Frequency Ordered Stop   04/13/17 1000  fluconazole (DIFLUCAN) tablet 100 mg     100 mg Oral Daily 04/12/17 1356 04/15/17 0959   04/08/17 1000  fluconazole (DIFLUCAN) IVPB 100 mg  Status:  Discontinued     100 mg 50 mL/hr over 60 Minutes Intravenous  Every 24 hours 04/08/17 0851 04/12/17 1356   04/04/17 0600  ceFAZolin (ANCEF) IVPB 2g/100 mL premix  Status:  Discontinued     2 g 200 mL/hr over 30 Minutes Intravenous On call to O.R. 04/03/17 1840 04/03/17 1848   03/28/17 1600  cefTAZidime (FORTAZ) 1 g in dextrose 5 % 50 mL IVPB  Status:  Discontinued     1 g 100 mL/hr over 30 Minutes Intravenous Every 24 hours 03/27/17 0814 03/28/17 0752   03/28/17 1100  piperacillin-tazobactam (ZOSYN) IVPB 3.375 g     3.375 g 12.5 mL/hr over 240 Minutes Intravenous Every 12 hours 03/28/17 1038 04/11/17 1359   03/27/17 0815  cefTAZidime (FORTAZ) 1 g in dextrose 5 % 50 mL IVPB     1 g 100 mL/hr over 30 Minutes Intravenous  Once 03/27/17 0814 03/27/17 1026   03/17/17 1800  cefTAZidime (FORTAZ) 2 g in dextrose 5 % 50 mL IVPB  Status:  Discontinued     2 g 100 mL/hr over 30 Minutes Intravenous Every T-Th-Sa (1800) 03/15/17 1143 03/18/17 1223   03/17/17 1200  vancomycin (VANCOCIN) IVPB 1000 mg/200 mL premix  Status:  Discontinued     1,000 mg 200 mL/hr over 60 Minutes Intravenous Every T-Th-Sa (Hemodialysis) 03/15/17 1143 03/18/17 1223   03/14/17 1200  vancomycin (VANCOCIN) IVPB 1000 mg/200 mL premix  Status:  Discontinued     1,000 mg 200 mL/hr over 60 Minutes Intravenous Every T-Th-Sa (Hemodialysis) 03/12/17 0912 03/14/17 1542   03/13/17 1430  ceFAZolin (ANCEF) IVPB 2g/100 mL premix  Status:  Discontinued     2 g 200 mL/hr over 30 Minutes Intravenous To ShortStay Surgical 03/12/17 1208 03/13/17 1113   03/13/17 1115  ceFAZolin (ANCEF) IVPB 2g/100 mL premix     2 g 200 mL/hr over 30 Minutes Intravenous To Surgery 03/13/17 1108 03/13/17 1506   03/12/17 1800  cefTAZidime (FORTAZ) 2 g in dextrose 5 % 50 mL IVPB  Status:  Discontinued     2 g 100 mL/hr over 30 Minutes Intravenous Every T-Th-Sa (1800) 03/12/17 0912 03/14/17 1542   03/12/17 1200  vancomycin (VANCOCIN) 1,500 mg in sodium chloride 0.9 % 250 mL IVPB     1,500 mg 250 mL/hr over 60 Minutes  Intravenous Every Thu (Hemodialysis) 03/12/17 0912 03/12/17 1356   03/02/17 1200  cefTRIAXone (ROCEPHIN) 2 g in dextrose 5 % 50 mL IVPB     2 g 100 mL/hr over 30 Minutes Intravenous Every 24 hours 03/02/17 0810 03/06/17 1358   03/02/17 1200  vancomycin (VANCOCIN) IVPB 1000 mg/200 mL premix     1,000 mg 200 mL/hr over 60 Minutes Intravenous Every M-W-F (Hemodialysis) 03/02/17 0815 03/02/17 1457   03/02/17 1200  metroNIDAZOLE (FLAGYL) IVPB 500 mg     500 mg 100 mL/hr over 60 Minutes Intravenous Every 8 hours 03/02/17 0948 03/06/17 2230   02/26/17 1200  vancomycin (VANCOCIN) IVPB 1000 mg/200 mL premix     1,000 mg 200 mL/hr over 60 Minutes Intravenous Every T-Th-Sa (Hemodialysis) 02/25/17 1151 03/05/17 1325   02/25/17 1200  vancomycin (VANCOCIN) 2,000 mg in sodium chloride 0.9 % 500 mL IVPB     2,000 mg 250 mL/hr over 120 Minutes Intravenous  Once 02/25/17 1149 02/25/17 1449   02/25/17 0900  cefTRIAXone (ROCEPHIN) 2 g in dextrose 5 % 50 mL IVPB  Status:  Discontinued     2 g 100 mL/hr over 30 Minutes Intravenous Every 24 hours 02/25/17 0814 03/02/17 0810   02/25/17 0900  metroNIDAZOLE (FLAGYL) IVPB 500 mg  Status:  Discontinued     500 mg 100 mL/hr over 60 Minutes Intravenous Every 8 hours 02/25/17 0814 03/02/17 0948   02/20/17 1200  vancomycin (VANCOCIN) IVPB 1000 mg/200 mL premix  Status:  Discontinued     1,000 mg 200 mL/hr over 60 Minutes Intravenous Every M-W-F (Hemodialysis) 02/19/17 1506 02/20/17 1403   02/17/17 1800  meropenem (MERREM) 500 mg in sodium chloride 0.9 % 50 mL IVPB  Status:  Discontinued     500 mg 100 mL/hr over 30 Minutes Intravenous Daily-1800 02/17/17 1026 02/25/17 0814   02/17/17 1200  vancomycin (VANCOCIN) IVPB 1000 mg/200 mL premix     1,000 mg 200 mL/hr over 60 Minutes Intravenous Every T-Th-Sa (Hemodialysis) 02/14/17 1758 02/19/17 1612   02/14/17 2200  clindamycin (CLEOCIN) IVPB 900 mg  Status:  Discontinued     900 mg 100 mL/hr over 30 Minutes Intravenous  Every 8 hours 02/14/17 1106 02/14/17 1928   02/14/17 2000  clindamycin (CLEOCIN) IVPB 900 mg  Status:  Discontinued     900 mg 100 mL/hr over 30 Minutes Intravenous Every 8 hours 02/14/17 1928 02/16/17 1047   02/14/17 1915  piperacillin-tazobactam (ZOSYN) IVPB 3.375 g  Status:  Discontinued     3.375 g 100 mL/hr over 30 Minutes Intravenous Every 12 hours 02/14/17 1801 02/17/17 1026  02/14/17 1845  vancomycin (VANCOCIN) 2,000 mg in sodium chloride 0.9 % 500 mL IVPB     2,000 mg 250 mL/hr over 120 Minutes Intravenous  Once 02/14/17 1758 02/14/17 2107   02/14/17 1115  clindamycin (CLEOCIN) IVPB 900 mg  Status:  Discontinued     900 mg 100 mL/hr over 30 Minutes Intravenous  Once 02/14/17 1106 02/14/17 2236   02/14/17 0945  clindamycin (CLEOCIN) IVPB 600 mg     600 mg 100 mL/hr over 30 Minutes Intravenous  Once 02/14/17 0931 02/14/17 1013       Objective: Vitals:   04/26/17 1741 04/26/17 2122 04/27/17 0431 04/27/17 0800  BP: (!) 148/98 (!) 120/101 129/89 131/80  Pulse: (!) 111 (!) 102 (!) 114 (!) 112  Resp: 18 16 18 16   Temp: 98.8 F (37.1 C) 98.8 F (37.1 C) 98.5 F (36.9 C) 98.1 F (36.7 C)  TempSrc: Oral   Oral  SpO2: 98% 99% 96% 99%  Weight:  75.8 kg (167 lb)    Height:        Intake/Output Summary (Last 24 hours) at 04/27/17 1717 Last data filed at 04/27/17 1300  Gross per 24 hour  Intake              363 ml  Output                0 ml  Net              363 ml   Filed Weights   04/24/17 2106 04/25/17 2227 04/26/17 2122  Weight: 79 kg (174 lb 2.6 oz) 77 kg (169 lb 12.1 oz) 75.8 kg (167 lb)    Examination: General exam: Appears comfortable - sleeping HEENT: PERRLA, oral mucosa moist, no sclera icterus or thrush Respiratory system: Clear to auscultation. Respiratory effort normal. Cardiovascular system: S1 & S2 heard,  No murmurs - tachycardic Gastrointestinal system: Abdomen soft, non-tender, nondistended. Normal bowel sound. No organomegaly Central nervous  system: Alert and oriented. No focal neurological deficits. Extremities: No cyanosis, clubbing or edema- left AKA Skin: right heel eschar Psychiatry:  Mood & affect appropriate.     Data Reviewed: I have personally reviewed following labs and imaging studies  CBC:  Recent Labs Lab 04/21/17 0800 04/23/17 1554 04/25/17 1247  WBC 7.2 7.9 7.9  HGB 9.4* 9.4* 9.1*  HCT 30.8* 30.7* 30.7*  MCV 95.1 95.0 96.5  PLT 365 326 276   Basic Metabolic Panel:  Recent Labs Lab 04/21/17 0800 04/23/17 1554 04/25/17 1248  NA 132* 131* 131*  K 3.8 4.2 4.0  CL 92* 94* 94*  CO2 26 26 26   GLUCOSE 90 152* 128*  BUN 49* 45* 41*  CREATININE 5.31* 5.16* 4.61*  CALCIUM 8.9 8.9 9.1  PHOS 6.1* 5.8* 4.5   GFR: Estimated Creatinine Clearance: 17.2 mL/min (A) (by C-G formula based on SCr of 4.61 mg/dL (H)). Liver Function Tests:  Recent Labs Lab 04/21/17 0800 04/23/17 1554 04/25/17 1248  ALBUMIN 2.4* 2.4* 2.5*   No results for input(s): LIPASE, AMYLASE in the last 168 hours. No results for input(s): AMMONIA in the last 168 hours. Coagulation Profile: No results for input(s): INR, PROTIME in the last 168 hours. Cardiac Enzymes: No results for input(s): CKTOTAL, CKMB, CKMBINDEX, TROPONINI in the last 168 hours. BNP (last 3 results) No results for input(s): PROBNP in the last 8760 hours. HbA1C: No results for input(s): HGBA1C in the last 72 hours. CBG:  Recent Labs Lab 04/26/17 1743 04/26/17 2114 04/27/17 0830  04/27/17 1203 04/27/17 1651  GLUCAP 140* 96 82 148* 87   Lipid Profile: No results for input(s): CHOL, HDL, LDLCALC, TRIG, CHOLHDL, LDLDIRECT in the last 72 hours. Thyroid Function Tests: No results for input(s): TSH, T4TOTAL, FREET4, T3FREE, THYROIDAB in the last 72 hours. Anemia Panel: No results for input(s): VITAMINB12, FOLATE, FERRITIN, TIBC, IRON, RETICCTPCT in the last 72 hours. Urine analysis:    Component Value Date/Time   COLORURINE AMBER (A) 02/14/2017 2111     APPEARANCEUR HAZY (A) 02/14/2017 2111   LABSPEC 1.015 02/14/2017 2111   PHURINE 5.0 02/14/2017 2111   GLUCOSEU NEGATIVE 02/14/2017 2111   HGBUR SMALL (A) 02/14/2017 2111   BILIRUBINUR NEGATIVE 02/14/2017 2111   KETONESUR NEGATIVE 02/14/2017 2111   PROTEINUR 30 (A) 02/14/2017 2111   UROBILINOGEN 0.2 09/21/2014 1908   NITRITE NEGATIVE 02/14/2017 2111   LEUKOCYTESUR TRACE (A) 02/14/2017 2111   Sepsis Labs: @LABRCNTIP (procalcitonin:4,lacticidven:4) )No results found for this or any previous visit (from the past 240 hour(s)).       Radiology Studies: No results found.    Scheduled Meds: . carvedilol  6.25 mg Oral BID WC  . darbepoetin (ARANESP) injection - DIALYSIS  200 mcg Intravenous Q Thu-HD  . dextrose  1 ampule Intravenous Once  . docusate sodium  100 mg Oral BID  . DULoxetine  30 mg Oral Daily  . feeding supplement (PRO-STAT SUGAR FREE 64)  30 mL Oral TID  . [name]'s butt cream   Topical QID  . heparin  5,000 Units Subcutaneous Q8H  . insulin aspart  0-15 Units Subcutaneous TID WC  . insulin glargine  8 Units Subcutaneous Daily  . mouth rinse  15 mL Mouth Rinse BID  . multivitamin  1 tablet Oral QHS  . sevelamer carbonate  1.6 g Oral TID WC  . sodium chloride flush  3 mL Intravenous Q12H   Continuous Infusions: . sodium chloride    . sodium chloride       LOS: 72 days    Time spent in minutes: 35    Calvert Cantor, MD [facility] Pager: [email or web address] Password [email or web address] 04/27/2017, 5:17 PM

## 2017-04-27 NOTE — Progress Notes (Signed)
Occupational Therapy Treatment Patient Details Name: [name] MRN: [id number] DOB: 18-Sep-1981 Today's Date: 04/27/2017    History of present illness [name] is a 36 y.o. 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 ETT 5/19 and trach'd 5/31. Trach removed 7/16.   OT comments  Pt progressing towards established OT goals as seen by completing grooming tasks at EOB while maintaining sitting balance with Min A. Pt performed lateral scoot transfer with Max A +2 using a sliding board to drop arm recliner. Will continue to follow acutely to increase pt occupational performance and participation. Continue to recommend dc to SNF for further OT.    Follow Up Recommendations  SNF;Supervision/Assistance - 24 hour    Equipment Recommendations  Wheelchair (measurements OT);Wheelchair cushion (measurements OT);Hospital bed;3 in 1 bedside commode    Recommendations for Other Services      Precautions / Restrictions Precautions Precautions: Fall Restrictions Weight Bearing Restrictions: Yes LLE Weight Bearing: Non weight bearing Other Position/Activity Restrictions: Per Dr. [name] RLE FWB if wound is fully closed/healed       Mobility Bed Mobility Overal bed mobility: Needs Assistance Bed Mobility: Rolling;Supine to Sit Rolling: Min assist   Supine to sit: Mod assist     General bed mobility comments: cues for all steps, increased time and physical assistance needed to sit up and bring hips to EOB.  Transfers Overall transfer level: Needs assistance Equipment used: 2 person hand held assist Transfers: Lateral/Scoot Transfers          Lateral/Scoot Transfers: With slide board;+2  safety/equipment;+2 physical assistance;Max assist General transfer comment: pt was cued to use RLE to weight bear and assist with transfer, but she had difficulty reaching her R residual foot to the floor. Attempted to use UEs for slide transfer, but not enough strength/control.     Balance Overall balance assessment: Needs assistance Sitting-balance support: Bilateral upper extremity supported;Feet unsupported Sitting balance-Leahy Scale: Poor Sitting balance - Comments: On initial supine to sit transfer, pt was unable to control trunk and required mod assist +2 for support. After remaining seated for 10+ minutes, pt was able to organize and control her trunk musculature to remain upright with min guard +2. Pt was able to perform feed forward weight shifts in A/P direction and bilaterally for muscle activation while maintaing BiUE support. Mod - max assist was required for lateral weight shift to place slide board under hips.                                     ADL either performed or assessed with clinical judgement   ADL Overall ADL's : Needs assistance/impaired     Grooming: Sitting;Minimal assistance Grooming Details (indicate cue type and reason): Pt performed grooming at EOB with Min A for balance while sitting                 Toilet Transfer: +2 for physical assistance;Maximal assistance (lateral scoot with sliding board) Toilet Transfer Details (indicate cue type and reason): Simulated BSC transfer with sliding board to drop arm recliner. Pt requiring Max A +2 for safety and balance.  General ADL Comments: Pt maintained sitting EOB for ~20 min with Min A.      Vision       Perception     Praxis      Cognition Arousal/Alertness: Awake/alert Behavior During Therapy: WFL for tasks assessed/performed;Flat affect Overall Cognitive Status: Impaired/Different from baseline Area of Impairment: Orientation;Attention;Memory;Following  commands;Awareness                 Orientation Level: Situation Current Attention Level: Sustained Memory: Decreased short-term memory Following Commands: Follows one step commands with increased time Safety/Judgement: Decreased awareness of safety;Decreased awareness of deficits Awareness: Intellectual Problem Solving: Slow processing;Decreased initiation;Requires verbal cues;Requires tactile cues General Comments: Pt is following one step instructions. Difficulty recalling recent information or answering open ended questions        Exercises     Shoulder Instructions       General Comments Pt mother present throughout session    Pertinent Vitals/ Pain       Pain Assessment: 0-10 Pain Score: 2  Pain Location: R LE after abrupt movement of leg Pain Descriptors / Indicators: Grimacing Pain Intervention(s): Monitored during session;Limited activity within patient's tolerance;Repositioned  Home Living                                          Prior Functioning/Environment              Frequency  Min 2X/week        Progress Toward Goals  OT Goals(current goals can now be found in the care plan section)  Progress towards OT goals: Progressing toward goals  Acute Rehab OT Goals Patient Stated Goal: to feel better, less tired OT Goal Formulation: With patient Time For Goal Achievement: 05/04/17 Potential to Achieve Goals: Fair ADL Goals Pt Will Perform Eating: with set-up;sitting Pt Will Perform Grooming: with min assist;sitting Pt Will Perform Upper Body Bathing: with min assist;sitting Pt Will Perform Lower Body Bathing: with max assist;bed level Additional ADL Goal #1: Pt will maintain EOB sitting x 15 mins with min A  Additional ADL Goal #2: Pt will roll with min assist for pericare and repositioning. Additional ADL Goal #3: Pt will sit with min assist and head at midline x 5 minutes.  Plan Discharge plan remains appropriate     Co-evaluation    PT/OT/SLP Co-Evaluation/Treatment: Yes Reason for Co-Treatment: Complexity of the patient's impairments (multi-system involvement) PT goals addressed during session: Mobility/safety with mobility OT goals addressed during session: ADL's and self-care      AM-PAC PT "6 Clicks" Daily Activity     Outcome Measure   Help from another person eating meals?: A Little Help from another person taking care of personal grooming?: A Little Help from another person toileting, which includes using toliet, bedpan, or urinal?: A Lot Help from another person bathing (including washing, rinsing, drying)?: A Lot Help from another person to put on and taking off regular upper body clothing?: A Lot Help from another person to put on and taking off regular lower body clothing?: Total 6 Click Score: 13    End of Session Equipment Utilized During Treatment: Other (comment) (Sliding board)  OT Visit Diagnosis: Other abnormalities of gait and mobility (R26.89);Muscle weakness (generalized) (M62.81);Feeding difficulties (R63.3);Other symptoms and signs involving cognitive function;Cognitive communication deficit (R41.841) Pain - Right/Left: Left Pain - part of body: Ankle and joints of foot   Activity  Tolerance Patient tolerated treatment well;Patient limited by fatigue   Patient Left with call bell/phone within reach;with family/visitor present;in chair;with chair alarm set   Nurse Communication Mobility status        Time: 1445-1526 OT Time Calculation (min): 41 min  Charges: OT General Charges $OT Visit: 1 Procedure OT Treatments $Self Care/Home Management : 8-22 mins  Lindzy Rupert MSOT, OTR/L Acute Rehab Pager: [phone] Office: [phone]   Theodoro Grist Dalaysia Harms 04/27/2017, 5:57 PM

## 2017-04-28 LAB — RENAL FUNCTION PANEL
ALBUMIN: 2.5 g/dL — AB (ref 3.5–5.0)
Anion gap: 11 (ref 5–15)
BUN: 60 mg/dL — ABNORMAL HIGH (ref 6–20)
CALCIUM: 9.2 mg/dL (ref 8.9–10.3)
CO2: 26 mmol/L (ref 22–32)
CREATININE: 5.52 mg/dL — AB (ref 0.44–1.00)
Chloride: 99 mmol/L — ABNORMAL LOW (ref 101–111)
GFR calc non Af Amer: 9 mL/min — ABNORMAL LOW (ref >60)
GFR, EST AFRICAN AMERICAN: 10 mL/min — AB (ref >60)
GLUCOSE: 92 mg/dL (ref 65–99)
PHOSPHORUS: 4 mg/dL (ref 2.5–4.6)
Potassium: 3.9 mmol/L (ref 3.5–5.1)
SODIUM: 136 mmol/L (ref 135–145)

## 2017-04-28 LAB — CBC
HCT: 31.1 % — ABNORMAL LOW (ref 36.0–46.0)
HEMOGLOBIN: 9.2 g/dL — AB (ref 12.0–15.0)
MCH: 28.8 pg (ref 26.0–34.0)
MCHC: 29.6 g/dL — ABNORMAL LOW (ref 30.0–36.0)
MCV: 97.2 fL (ref 78.0–100.0)
PLATELETS: 307 10*3/uL (ref 150–400)
RBC: 3.2 MIL/uL — ABNORMAL LOW (ref 3.87–5.11)
RDW: 16.3 % — ABNORMAL HIGH (ref 11.5–15.5)
WBC: 9 10*3/uL (ref 4.0–10.5)

## 2017-04-28 LAB — GLUCOSE, CAPILLARY
Glucose-Capillary: 84 mg/dL (ref 65–99)
Glucose-Capillary: 127 mg/dL — ABNORMAL HIGH (ref 65–99)
Glucose-Capillary: 124 mg/dL — ABNORMAL HIGH (ref 65–99)

## 2017-04-28 NOTE — Progress Notes (Signed)
  Speech Language Pathology Treatment: Cognitive-Linquistic  Patient Details Name: [name] MRN: [id number] DOB: 10-25-1980 Today's Date: 04/28/2017 Time: 2902-1115 SLP Time Calculation (min) (ACUTE ONLY): 35 min  Assessment / Plan / Recommendation Clinical Impression  Pt seen for cognitive treatment with mom present. She is making great progress toward her goals. She required mod-max assist for temporal orientation using calendar. She stated "I haven't been here very long." Working memory is decreased. SLP initiated memory pages where pt wrote activities she did today and information she wants to recall. She had no recollection of going to HD this morning (not even after cued). SLP encouraged pt to fill out each day. She required assist for simple subtraction with regrouping. SLP left printed sheets (homework) for pt to complete. WIll continue ST.   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 Pt found unresponsive and code blue activated 5/19 as RN placing leads for cardiac monitoring (uncertain length of unresponsiveness), s/p anoxic brain injury, intubated and trach'd 5/31. Hospital course included cardiac arrest, transfer to ICU. Pt post decannulation of tracheostomy.      SLP Plan  Continue with current plan of care       Recommendations                   Oral Care Recommendations: Oral care BID Follow up Recommendations: Skilled Nursing facility SLP Visit Diagnosis: Cognitive communication deficit (Z20.802) Plan: Continue with current plan of care       GO                [name] 04/28/2017, 2:35 PM  [name] [name].Ed [employer] [phone]

## 2017-04-28 NOTE — Clinical Social Work Note (Addendum)
Admissions director, Lamont Snowball visited with patient at the bedside to met her and mother and to see her possibly sitting in wheelchair or bedside chair. Patient was sitting in wheelchair with a strap around her to keep her from sliding out of wheelchair. Per Ms. Holsley, this is a restraint and they cannot accept patients with any type of restraint, and other facilities don's accept restraints as well. CSW contacted Tammy with [facility] facilities regarding patient and she also indicated that they as well as other SNF's don't accept patients who are restrained in any way. MD contacted and updated and reported that she will not be attending and to update new attending.   CSW talked with patient's nurse and nurse techs (before 6 pm) and was advised that patient was not strapped into her wheelchair today and has not been strapped in for several days as they were instructed not to do so. CSW attempted to reach admissions director with [facility] without success and will contact her on Wednesday.   Charge nurse (6E) also updated. CSW will continue to follow and contact other facilities regarding patient on Wednesday.   Rhylee Nunn Givens, MSW, LCSW Licensed Clinical Social Worker [place] [phone]

## 2017-04-28 NOTE — Progress Notes (Addendum)
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   Admitted on 02/14/17 to [facility] with left foot/leg pain, wound dehiscence and purulent drainage from the left trans-metatarsal amputation site.  Later that day, she was found to be unresponsive and asystolic. CPR started with ROSC in approximately 9 minutes. She was intubated by anaesthesia, transferred to the intensive care unit and subsequently underwent cooling protocol. She couldn't be weaned off the ventilator and as a result underwent tracheostomy on 5/31. She underwent decannulation on 7/16.  Neurological/ Cognitive status has been poor. She has been evaluated by neurology on 5/29 and, per neurology, chances of meaningful neurological recovery appeared to be dismal.  Currently she is able to follow simple commands and is alert and is eating.  Hospital course complicated by persistent leukocytosis, respiratory failure and left transmetatarsal wound dehiscence with gangrene. 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  Over the course of her hospitalizations, she has been treated for multiple episodes of recurrent aspiration pneumonias.  Currently off antibiotics and awaiting placement.  The patient is unable to sit in a normal wheel chair to be transported to the HD due to poor truncal stability.  She cannot be discharged until she is able to sit in a normal wheel chair (without sliding down) for transport to HD. PT is working with her and RNs are getting her up to a wheelchair.    Subjective:  no complaints.   ROS: no complaints of nausea, vomiting, constipation diarrhea, cough, dyspnea or  dysuria. No other complaints.   Assessment & Plan: Acute hypoxemic vent dependent respiratory failure in a setting of cardiac arrest Subsequent Aspiration pneumonia- RLL  Tracheostomy  - Intubated 6/19-6/20.  5/31- tracheostomy-  Recurrent aspirations and on antibiotics  - S/P MBS 6/23.  FEES 6/27. Patient started on nectar thick liquids and dysphagia 1 diet 6/27.   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  - Orthopedics removed wound VAC 6/20 - revision of her left AKA  04/03/17  with reapplication of wound vac which was subsequently removed. -  wound remains clean and is healing well  Anoxic brain injury secondary to Cardiac arrest on 5/19: - slow cognitive improvement - following commands, talking & feeding herself now but has poor mobility and mostly sleeps during the day  Sinus tachycardia - likely due to severe deconditioning - Coreg -  6.25    Diarrhea  - resolved    Normocytic anemia Likely secondary to chronic disease-probably worsened by acute illness.     Diabetes mellitus type 2  Continue with SSI. On Lantus. HbA1c 5.9.   Chronic systolic heart failure/nonischemic cardiomyopathy -  Echocardiogram>> EF 25-30% with diffuse hypokinesis - volume management with dialysis - cont Coreg as tolerated  Hypotension/ HTN - Midodrine with dialysis discontinued  due to resolution of hypotension - using Coreg now for HTN  Right heel eschar - wound care RN following - Prevalon boot  Ulcers on sacral area- incontinence associated dermatitis - WOC   - barrier cream - air mattress    Diabetes mellitus 2 - cont Lantus 8 U and SSI with meals  Thrush - resolved with treatment with Diflucan and Nystatin    Pericardial effusion Seen on TEE- no evidence of tamponade pathophysiology. Repeat echo 6/26 with no significant pericardial effusion.   Moderate protein calorie malnutrition - taking orals well now-  - Nephro carb  Depression - has been on Cymbalta at home which was being held-   resumed on 7/13  DVT prophylaxis: Heparin Code Status: Full code Family Communication: mother Disposition Plan: SNF  Consultants:   PCCM    Nephrology  Palliative care consult   Orthopedics,  Procedures:  Lower extremity ultrasound 5/18 >> no evidence of DVT CT head 5/19 >> normal exam Echo 5/20 >>EF 30-35%, increased LVF. Diffuse hypokinesis, akinesis of basilar mid-inferior myocardium, grade 2 diastolic dysfxn  EEG 5/20 >>finding c/w mod to severe global cerebral dysfxn. C/w anoxic injury given clinical course  LE [place] 5/21 >> negative MRI brain 5/24 >> ?thrombosed cortical vein, otherwise normal TEE 6/4 >>mild MR, normal AV, mild TR, mild PR, EF 30-35%, diffuse hypokinesis, no thrombus, no PFO, normal RV, moderate pericardial effusion with synechia suggesting some chronicity, no vegetations   Antimicrobials:  Anti-infectives    Start     Dose/Rate Route Frequency Ordered Stop   04/13/17 1000  fluconazole (DIFLUCAN) tablet 100 mg     100 mg Oral Daily 04/12/17 1356 04/15/17 0959   04/08/17 1000  fluconazole (DIFLUCAN) IVPB 100 mg  Status:  Discontinued     100 mg 50  mL/hr over 60 Minutes Intravenous Every 24 hours 04/08/17 0851 04/12/17 1356   04/04/17 0600  ceFAZolin (ANCEF) IVPB 2g/100 mL premix  Status:  Discontinued     2 g 200 mL/hr over 30 Minutes Intravenous On call to O.R. 04/03/17 1840 04/03/17 1848   03/28/17 1600  cefTAZidime (FORTAZ) 1 g in dextrose 5 % 50 mL IVPB  Status:  Discontinued     1 g 100 mL/hr over 30 Minutes Intravenous Every 24 hours 03/27/17 0814 03/28/17 0752   03/28/17 1100  piperacillin-tazobactam (ZOSYN) IVPB 3.375 g     3.375 g 12.5 mL/hr over 240 Minutes Intravenous Every 12 hours 03/28/17 1038 04/11/17 1359   03/27/17 0815  cefTAZidime (FORTAZ) 1 g in dextrose 5 % 50 mL IVPB     1 g 100 mL/hr over 30 Minutes Intravenous  Once 03/27/17 0814 03/27/17 1026   03/17/17 1800  cefTAZidime (FORTAZ) 2 g in dextrose 5 % 50 mL IVPB  Status:  Discontinued     2 g 100 mL/hr over 30 Minutes Intravenous Every T-Th-Sa (1800) 03/15/17 1143 03/18/17 1223   03/17/17 1200  vancomycin (VANCOCIN) IVPB 1000 mg/200 mL premix  Status:  Discontinued     1,000 mg 200 mL/hr over 60 Minutes Intravenous Every T-Th-Sa (Hemodialysis)  03/15/17 1143 03/18/17 1223   03/14/17 1200  vancomycin (VANCOCIN) IVPB 1000 mg/200 mL premix  Status:  Discontinued     1,000 mg 200 mL/hr over 60 Minutes Intravenous Every T-Th-Sa (Hemodialysis) 03/12/17 0912 03/14/17 1542   03/13/17 1430  ceFAZolin (ANCEF) IVPB 2g/100 mL premix  Status:  Discontinued     2 g 200 mL/hr over 30 Minutes Intravenous To ShortStay Surgical 03/12/17 1208 03/13/17 1113   03/13/17 1115  ceFAZolin (ANCEF) IVPB 2g/100 mL premix     2 g 200 mL/hr over 30 Minutes Intravenous To Surgery 03/13/17 1108 03/13/17 1506   03/12/17 1800  cefTAZidime (FORTAZ) 2 g in dextrose 5 % 50 mL IVPB  Status:  Discontinued     2 g 100 mL/hr over 30 Minutes Intravenous Every T-Th-Sa (1800) 03/12/17 0912 03/14/17 1542   03/12/17 1200  vancomycin (VANCOCIN) 1,500 mg in sodium chloride 0.9 % 250 mL IVPB     1,500  mg 250 mL/hr over 60 Minutes Intravenous Every Thu (Hemodialysis) 03/12/17 0912 03/12/17 1356   03/02/17 1200  cefTRIAXone (ROCEPHIN) 2 g in dextrose 5 % 50 mL IVPB     2 g 100 mL/hr over 30 Minutes Intravenous Every 24 hours 03/02/17 0810 03/06/17 1358   03/02/17 1200  vancomycin (VANCOCIN) IVPB 1000 mg/200 mL premix     1,000 mg 200 mL/hr over 60 Minutes Intravenous Every M-W-F (Hemodialysis) 03/02/17 0815 03/02/17 1457   03/02/17 1200  metroNIDAZOLE (FLAGYL) IVPB 500 mg     500 mg 100 mL/hr over 60 Minutes Intravenous Every 8 hours 03/02/17 0948 03/06/17 2230   02/26/17 1200  vancomycin (VANCOCIN) IVPB 1000 mg/200 mL premix     1,000 mg 200 mL/hr over 60 Minutes Intravenous Every T-Th-Sa (Hemodialysis) 02/25/17 1151 03/05/17 1325   02/25/17 1200  vancomycin (VANCOCIN) 2,000 mg in sodium chloride 0.9 % 500 mL IVPB     2,000 mg 250 mL/hr over 120 Minutes Intravenous  Once 02/25/17 1149 02/25/17 1449   02/25/17 0900  cefTRIAXone (ROCEPHIN) 2 g in dextrose 5 % 50 mL IVPB  Status:  Discontinued     2 g 100 mL/hr over 30 Minutes Intravenous Every 24 hours 02/25/17 0814 03/02/17 0810   02/25/17 0900  metroNIDAZOLE (FLAGYL) IVPB 500 mg  Status:  Discontinued     500 mg 100 mL/hr over 60 Minutes Intravenous Every 8 hours 02/25/17 0814 03/02/17 0948   02/20/17 1200  vancomycin (VANCOCIN) IVPB 1000 mg/200 mL premix  Status:  Discontinued     1,000 mg 200 mL/hr over 60 Minutes Intravenous Every M-W-F (Hemodialysis) 02/19/17 1506 02/20/17 1403   02/17/17 1800  meropenem (MERREM) 500 mg in sodium chloride 0.9 % 50 mL IVPB  Status:  Discontinued     500 mg 100 mL/hr over 30 Minutes Intravenous Daily-1800 02/17/17 1026 02/25/17 0814   02/17/17 1200  vancomycin (VANCOCIN) IVPB 1000 mg/200 mL premix     1,000 mg 200 mL/hr over 60 Minutes Intravenous Every T-Th-Sa (Hemodialysis) 02/14/17 1758 02/19/17 1612   02/14/17 2200  clindamycin (CLEOCIN) IVPB 900 mg  Status:  Discontinued     900 mg 100  mL/hr over 30 Minutes Intravenous Every 8 hours 02/14/17 1106 02/14/17 1928   02/14/17 2000  clindamycin (CLEOCIN) IVPB 900 mg  Status:  Discontinued     900 mg 100 mL/hr over 30 Minutes Intravenous Every 8 hours 02/14/17 1928 02/16/17 1047   02/14/17 1915  piperacillin-tazobactam (ZOSYN) IVPB 3.375 g  Status:  Discontinued  3.375 g 100 mL/hr over 30 Minutes Intravenous Every 12 hours 02/14/17 1801 02/17/17 1026   02/14/17 1845  vancomycin (VANCOCIN) 2,000 mg in sodium chloride 0.9 % 500 mL IVPB     2,000 mg 250 mL/hr over 120 Minutes Intravenous  Once 02/14/17 1758 02/14/17 2107   02/14/17 1115  clindamycin (CLEOCIN) IVPB 900 mg  Status:  Discontinued     900 mg 100 mL/hr over 30 Minutes Intravenous  Once 02/14/17 1106 02/14/17 2236   02/14/17 0945  clindamycin (CLEOCIN) IVPB 600 mg     600 mg 100 mL/hr over 30 Minutes Intravenous  Once 02/14/17 0931 02/14/17 1013       Objective: Vitals:   04/28/17 1045 04/28/17 1100 04/28/17 1130 04/28/17 1157  BP: (!) 130/91 (!) 138/91 (!) 154/91 138/88  Pulse: (!) 109 (!) 110 (!) 107 (!) 107  Resp:    (!) 24  Temp:    97.9 F (36.6 C)  TempSrc:    Oral  SpO2:    96%  Weight:      Height:        Intake/Output Summary (Last 24 hours) at 04/28/17 1320 Last data filed at 04/28/17 1157  Gross per 24 hour  Intake              360 ml  Output              500 ml  Net             -140 ml   Filed Weights   04/25/17 2227 04/26/17 2122 04/27/17 2147  Weight: 77 kg (169 lb 12.1 oz) 75.8 kg (167 lb) 75.6 kg (166 lb 10.7 oz)    Examination: General exam: Appears comfortable  HEENT: PERRLA, oral mucosa moist, no sclera icterus or thrush Respiratory system: Clear to auscultation. Respiratory effort normal. Cardiovascular system: S1 & S2 heard,  No murmurs  Gastrointestinal system: Abdomen soft, non-tender, nondistended. Normal bowel sound. No organomegaly Central nervous system: Alert and oriented. No focal neurological  deficits. Extremities: No cyanosis, clubbing or edema- left BKA- right transmetatarsal ampuatation Skin: right heel necrotic ulcer Psychiatry:  flat affect- poorly communicative    Data Reviewed: I have personally reviewed following labs and imaging studies  CBC:  Recent Labs Lab 04/23/17 1554 04/25/17 1247 04/28/17 0801  WBC 7.9 7.9 9.0  HGB 9.4* 9.1* 9.2*  HCT 30.7* 30.7* 31.1*  MCV 95.0 96.5 97.2  PLT 326 276 307   Basic Metabolic Panel:  Recent Labs Lab 04/23/17 1554 04/25/17 1248 04/28/17 0801  NA 131* 131* 136  K 4.2 4.0 3.9  CL 94* 94* 99*  CO2 26 26 26   GLUCOSE 152* 128* 92  BUN 45* 41* 60*  CREATININE 5.16* 4.61* 5.52*  CALCIUM 8.9 9.1 9.2  PHOS 5.8* 4.5 4.0   GFR: Estimated Creatinine Clearance: 14.3 mL/min (A) (by C-G formula based on SCr of 5.52 mg/dL (H)). Liver Function Tests:  Recent Labs Lab 04/23/17 1554 04/25/17 1248 04/28/17 0801  ALBUMIN 2.4* 2.5* 2.5*   No results for input(s): LIPASE, AMYLASE in the last 168 hours. No results for input(s): AMMONIA in the last 168 hours. Coagulation Profile: No results for input(s): INR, PROTIME in the last 168 hours. Cardiac Enzymes: No results for input(s): CKTOTAL, CKMB, CKMBINDEX, TROPONINI in the last 168 hours. BNP (last 3 results) No results for input(s): PROBNP in the last 8760 hours. HbA1C: No results for input(s): HGBA1C in the last 72 hours. CBG:  Recent Labs Lab 04/27/17  0830 04/27/17 1203 04/27/17 1651 04/27/17 2130 04/28/17 1230  GLUCAP 82 148* 87 121* 84   Lipid Profile: No results for input(s): CHOL, HDL, LDLCALC, TRIG, CHOLHDL, LDLDIRECT in the last 72 hours. Thyroid Function Tests: No results for input(s): TSH, T4TOTAL, FREET4, T3FREE, THYROIDAB in the last 72 hours. Anemia Panel: No results for input(s): VITAMINB12, FOLATE, FERRITIN, TIBC, IRON, RETICCTPCT in the last 72 hours. Urine analysis:    Component Value Date/Time   COLORURINE AMBER (A) 02/14/2017 2111    APPEARANCEUR HAZY (A) 02/14/2017 2111   LABSPEC 1.015 02/14/2017 2111   PHURINE 5.0 02/14/2017 2111   GLUCOSEU NEGATIVE 02/14/2017 2111   HGBUR SMALL (A) 02/14/2017 2111   BILIRUBINUR NEGATIVE 02/14/2017 2111   KETONESUR NEGATIVE 02/14/2017 2111   PROTEINUR 30 (A) 02/14/2017 2111   UROBILINOGEN 0.2 09/21/2014 1908   NITRITE NEGATIVE 02/14/2017 2111   LEUKOCYTESUR TRACE (A) 02/14/2017 2111   Sepsis Labs: @LABRCNTIP (procalcitonin:4,lacticidven:4) )No results found for this or any previous visit (from the past 240 hour(s)).       Radiology Studies: No results found.    Scheduled Meds: . carvedilol  6.25 mg Oral BID WC  . darbepoetin (ARANESP) injection - DIALYSIS  200 mcg Intravenous Q Thu-HD  . dextrose  1 ampule Intravenous Once  . docusate sodium  100 mg Oral BID  . DULoxetine  30 mg Oral Daily  . feeding supplement (PRO-STAT SUGAR FREE 64)  30 mL Oral TID  . [name]'s butt cream   Topical QID  . heparin  5,000 Units Subcutaneous Q8H  . insulin aspart  0-15 Units Subcutaneous TID WC  . insulin glargine  8 Units Subcutaneous Daily  . mouth rinse  15 mL Mouth Rinse BID  . multivitamin  1 tablet Oral QHS  . sevelamer carbonate  1.6 g Oral TID WC  . sodium chloride flush  3 mL Intravenous Q12H   Continuous Infusions: . sodium chloride    . sodium chloride       LOS: 73 days    Time spent in minutes: 35    Calvert Cantor, MD [facility] Pager: [email or web address] Password [email or web address] 04/28/2017, 1:20 PM

## 2017-04-28 NOTE — Progress Notes (Signed)
Patient placed in dialysis chair today around 7:30am and stayed in chair until around 5pm. Patient was able to sit up in chair and was not strapped into chair. Patient tolerated sitting up well.

## 2017-04-28 NOTE — Consult Note (Signed)
WOC Nurse follow-up wound consult note Ortho service following for assessment and plan of care to left stump surgical site.  Reason for Consult: Right heel with unstageable pressure injury. Pressure Injury POA: No Measurement: 5X8cm Wound bed: 90% dry eschar; 10% red at loose edges which continue to lift and peel. Some odor, no drainage or fluctuance. Dressing procedure/placement/frequency: Prevalon boot is in place to reduce pressure. Foam dressing to protect from further injury. It is best practice to leave dry stable eschar in place. No topical treatment is indicated at this time; will plan to re-assess weekly.  Cammie Mcgee MSN, RN, CWOCN, [place], CNS [phone].

## 2017-04-28 NOTE — Procedures (Signed)
Patient seen on Hemodialysis. QB 350, UF goal 1L Treatment adjusted as needed.  Zetta Bills MD [facility]. Office # [phone] Pager # [phone] 10:01 AM

## 2017-04-28 NOTE — Progress Notes (Signed)
Patient ID: [name], female   DOB: 1981-06-15, 36 y.o.   MRN: [id number]  [facility] Progress Note   Assessment/ Plan:   1. Anoxic brain injury: s/p cardiac arrest, ongoing slow improvement in cognition- still with significant functional deficit. 2. Resp failure (s/p trach, now decannulated 7/16): requiring intermittent oxygen supplementation when fall asleep with desaturation. 3. ESRD: Ongoing HD at this time to continue TTS schedule, no heparin. . 4. Debility: continue efforts at physical therapy for truncal strengthening and training to allow for prolonged sitting and some assistance with transfers to allow OP HD.  5. HTN/ volume:On low-dose coreg 6.25mg  BID primarily for tachycardia. PTA was on midodrine- will monitor closely and limit hypotensive episodes.  6. Anemia of ESRD:Hgb 9.1, on Aranesp weekly. Last Tsat 36% on 7/19, no need for IV iron. 7. Secondary hyperparathyroidism: Calcitriol has been on hold due to higher Ca, last PTH 88 (03/23/17). [name] was previously on hold, now resumed d/t higher Phos. Monitor. 8. 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 S/p wound vac. 9. Type 2 DM:On insulin, per primary. 10. Chronic systolic CHF: EF 33-00%. 11. Nutrition: Alb 2.5, continue Pro-stat.  Subjective:   Reports some intermittent right foot pain. Denies any CP/SOB.   Objective:   BP (!) 132/92   Pulse (!) 113   Temp 97.9 F (36.6 C) (Oral)   Resp 19   Ht 5' 8.5" (1.74 m)   Wt 75.6 kg (166 lb 10.7 oz)   LMP  (LMP Unknown) Comment: Patient trached  SpO2 95%   BMI 24.97 kg/m   Physical Exam: TMA:UQJFHLK to comfortable at dialysis--in a recliner TGY:BWLSL regular tachycardia, normal s1 and s2 Resp:Decreased breath sounds over bases, no rales/rhonchi Abd: soft, obese, NT, BS normal Ext:s/p left AKA and with right foot in soft boot/dressing  Labs: BMET  Recent Labs Lab 04/23/17 1554 04/25/17 1248 04/28/17 0801  NA 131* 131* 136   K 4.2 4.0 3.9  CL 94* 94* 99*  CO2 26 26 26   GLUCOSE 152* 128* 92  BUN 45* 41* 60*  CREATININE 5.16* 4.61* 5.52*  CALCIUM 8.9 9.1 9.2  PHOS 5.8* 4.5 4.0   CBC  Recent Labs Lab 04/23/17 1554 04/25/17 1247 04/28/17 0801  WBC 7.9 7.9 9.0  HGB 9.4* 9.1* 9.2*  HCT 30.7* 30.7* 31.1*  MCV 95.0 96.5 97.2  PLT 326 276 307   Medications:    . carvedilol  6.25 mg Oral BID WC  . darbepoetin (ARANESP) injection - DIALYSIS  200 mcg Intravenous Q Thu-HD  . dextrose  1 ampule Intravenous Once  . docusate sodium  100 mg Oral BID  . DULoxetine  30 mg Oral Daily  . feeding supplement (PRO-STAT SUGAR FREE 64)  30 mL Oral TID  . [name]'s butt cream   Topical QID  . heparin  5,000 Units Subcutaneous Q8H  . insulin aspart  0-15 Units Subcutaneous TID WC  . insulin glargine  8 Units Subcutaneous Daily  . mouth rinse  15 mL Mouth Rinse BID  . multivitamin  1 tablet Oral QHS  . sevelamer carbonate  1.6 g Oral TID WC  . sodium chloride flush  3 mL Intravenous Q12H   Beth Bills, MD 04/28/2017, 10:02 AM

## 2017-04-28 NOTE — Plan of Care (Signed)
Problem: Physical Regulation: Goal: Will remain free from infection Outcome: Progressing Patient aware of need to keep skin clean and dry in order to prevent infection. Patient aware of need to drink water and drinks water frequently throughout the day.

## 2017-04-29 LAB — GLUCOSE, CAPILLARY
GLUCOSE-CAPILLARY: 111 mg/dL — AB (ref 65–99)
GLUCOSE-CAPILLARY: 164 mg/dL — AB (ref 65–99)
GLUCOSE-CAPILLARY: 95 mg/dL (ref 65–99)
GLUCOSE-CAPILLARY: 153 mg/dL — AB (ref 65–99)

## 2017-04-29 LAB — PTH, INTACT AND CALCIUM
CALCIUM TOTAL (PTH): 9 mg/dL (ref 8.7–10.2)
PTH: 29 pg/mL (ref 15–65)

## 2017-04-29 MED ORDER — NEPRO/CARBSTEADY PO LIQD: 237.0000 mL | Freq: Two times a day (BID) | ORAL | 0 refills | Status: AC | PRN

## 2017-04-29 MED ORDER — SEVELAMER CARBONATE 0.8 G PO PACK: 1.6000 g | PACK | Freq: Three times a day (TID) | ORAL | Status: AC

## 2017-04-29 NOTE — Progress Notes (Signed)
Patient ID: [name], female   DOB: 01-11-1981, 36 y.o.   MRN: [id number]  This NP visited patient at the bedside along with mother for f/u to assess for pallaitive.  needs and emotional support.  Patient mother expressed their understanding that disposition to skilled nursing facility for rehabilitation will occur in the next few days.  They are very pleased and happy with the patient's progress.   I discussed with both of them the importance of increasing self-care and mobility.  Patient hopes to ultimately "get back home again".  Patient verbalized her desire to "just do something",   she tells me she liked to fish and be outside.  Discussed with patient the importance of continued conversation with family and their  medical providers regarding overall plan of care and treatment options,  ensuring decisions are within the context of the patients values and GOCs.  Questions and concerns addressed    Palliative medicine team will continue to support holistically.  Time in  10:30 AM         Time out    11:05 AM    Total time spent on the unit was 35 minutes  Greater than 50% of the time was spent in counseling and coordination of care  [name] NP  Palliative Medicine Team Team Phone # [phone] Pager [phone]

## 2017-04-29 NOTE — Progress Notes (Signed)
Occupational Therapy Treatment Patient Details Name: [name] MRN: [id number] DOB: 19-Apr-1981 Today's Date: 04/29/2017    History of present illness [name] is a 36 y.o. 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 ETT 5/19 and trach'd 5/31. Trach removed 7/16.   OT comments  Pt with improvements in cognition and functional mobility; able to perform lateral scoot transfer x2 with use of slide board and mod assist +2. Pt continues to require assist for sitting balance and max assist for LB dressing due to poor balance and fatigue. D/c plan remains appropriate. Will continue to follow acutely.   Follow Up Recommendations  SNF;Supervision/Assistance - 24 hour    Equipment Recommendations  Wheelchair (measurements OT);Wheelchair cushion (measurements OT);Hospital bed;3 in 1 bedside commode    Recommendations for Other Services      Precautions / Restrictions Precautions Precautions: Fall Restrictions Weight Bearing Restrictions: Yes RLE Weight Bearing: Non weight bearing LLE Weight Bearing: Weight bearing as tolerated Other Position/Activity Restrictions: Prevalon boot on RLE       Mobility Bed Mobility Overal bed mobility: Needs Assistance Bed Mobility: Sidelying to Sit;Rolling;Sit to Sidelying Rolling: Min assist;+2 for safety/equipment Sidelying to sit: Min assist;+2 for physical assistance;+2 for safety/equipment     Sit to sidelying: Min assist;+2 for safety/equipment General bed mobility comments: Pt able to grasp rail and pull trunk to side with min assist. Pt needed cueing for most bed mobility, but was able to perform the tasks with minimal assist.    Transfers Overall transfer level: Needs assistance Equipment used: 2 person hand held assist Transfers: Lateral/Scoot Transfers (with sliding-board )          Lateral/Scoot Transfers: Mod assist;+2 physical assistance;+2 safety/equipment;With slide board General transfer comment: Pt was able to transfer from EOB to chair and back with mod assist, while WBAT through RLE. Pt remembered the transfer routine and was able to tranfer quicker and with more efficiency. Frequent rest breaks were needed due to dizziness and regaining orientation.    Balance Overall balance assessment: Needs assistance Sitting-balance support: Bilateral upper extremity supported;Feet supported Sitting balance-Leahy Scale: Poor Sitting balance - Comments: Pt was able to balance while sitting with support from both UE with min guard to min assist for safety.  Postural control: Posterior lean                                 ADL either performed or assessed with clinical judgement   ADL Overall ADL's : Needs assistance/impaired                     Lower Body Dressing: Maximal assistance Lower Body Dressing Details (indicate cue type and reason): to don R sock; pt attempting but unable to because she was "too tired" Toilet Transfer: Moderate assistance;+2 for physical assistance (lateral scoot with slide board)           Functional mobility during ADLs: Moderate assistance;+2 for physical assistance (for lateral scoot with slide board)       Vision       Perception     Praxis  Cognition Arousal/Alertness: Awake/alert Behavior During Therapy: WFL for tasks assessed/performed Overall Cognitive Status: Within Functional Limits for tasks assessed                       Memory:  (No memory of last PT session ) Following Commands: Follows one step commands consistently       General Comments: Per PT student report; pt with improved cognition from last session.         Exercises     Shoulder Instructions       General Comments Pt's performance increased from last session. Pt was able to converse with those in the room without getting distracted and losing focus on task. Pt was able to move quicker and with more efficiency.    Pertinent Vitals/ Pain       Pain Assessment: No/denies pain  Home Living                                          Prior Functioning/Environment              Frequency  Min 2X/week        Progress Toward Goals  OT Goals(current goals can now be found in the care plan section)  Progress towards OT goals: Progressing toward goals  Acute Rehab OT Goals Patient Stated Goal: to feel better, less tired OT Goal Formulation: With patient  Plan Discharge plan remains appropriate    Co-evaluation    PT/OT/SLP Co-Evaluation/Treatment: Yes Reason for Co-Treatment: Complexity of the patient's impairments (multi-system involvement);For patient/therapist safety PT goals addressed during session: Mobility/safety with mobility;Balance OT goals addressed during session: Strengthening/ROM      AM-PAC PT "6 Clicks" Daily Activity     Outcome Measure   Help from another person eating meals?: None Help from another person taking care of personal grooming?: A Little Help from another person toileting, which includes using toliet, bedpan, or urinal?: A Lot Help from another person bathing (including washing, rinsing, drying)?: A Lot Help from another person to put on and taking off regular upper body clothing?: A Lot Help from another person to put on and taking off regular lower body clothing?: A Lot 6 Click Score: 15    End of Session Equipment Utilized During Treatment: Gait belt;Other (comment) (slide board)  OT Visit Diagnosis: Other abnormalities of gait and mobility (R26.89);Muscle weakness (generalized) (M62.81);Feeding difficulties (R63.3);Other symptoms and signs involving cognitive  function;Cognitive communication deficit (R41.841) Pain - Right/Left: Left Pain - part of body: Ankle and joints of foot   Activity Tolerance Patient tolerated treatment well   Patient Left in bed;with call bell/phone within reach;with bed alarm set   Nurse Communication          Time: 8119-1478 OT Time Calculation (min): 29 min  Charges: OT General Charges $OT Visit: 1 Procedure OT Treatments $Therapeutic Activity: 8-22 mins  [name], M.S., OTR/L Pager: [phone]   [name] 04/29/2017, 5:19 PM

## 2017-04-29 NOTE — Progress Notes (Signed)
[facility] Progress Note   Subjective: Seen in room, drowsy but awake. No overnight events. Notes indicate that she was able to sit up in HD chair around 9 hours yesterday without incident. Denies CP or dyspnea today.  Objective Vitals:   04/28/17 1740 04/28/17 2110 04/29/17 0517 04/29/17 0948  BP: (!) 141/76 (!) 135/96 (!) 136/92 (!) 140/97  Pulse: (!) 101 (!) 108 (!) 104 (!) 107  Resp: 20 19 20 18   Temp: 98.4 F (36.9 C) 98.7 F (37.1 C) 98.7 F (37.1 C) 98.9 F (37.2 C)  TempSrc: Oral Oral Oral Oral  SpO2: 98% 98% 99% 96%  Weight:  75.2 kg (165 lb 12.6 oz)    Height:       Physical Exam General: Well appearing, NAD. Heart: Slightly tachycardic. No murmur Lungs: CTA anteriorly, no wheezing/rales Extremities: No LE edema, s/p L AKA Dialysis Access: AVF + thrill/bruit  Additional Objective Labs: Basic Metabolic Panel:  Recent Labs Lab 04/23/17 1554 04/25/17 1248 04/28/17 0801  NA 131* 131* 136  K 4.2 4.0 3.9  CL 94* 94* 99*  CO2 26 26 26   GLUCOSE 152* 128* 92  BUN 45* 41* 60*  CREATININE 5.16* 4.61* 5.52*  CALCIUM 8.9 9.1 9.2  9.0  PHOS 5.8* 4.5 4.0   Liver Function Tests:  Recent Labs Lab 04/23/17 1554 04/25/17 1248 04/28/17 0801  ALBUMIN 2.4* 2.5* 2.5*   CBC:  Recent Labs Lab 04/23/17 1554 04/25/17 1247 04/28/17 0801  WBC 7.9 7.9 9.0  HGB 9.4* 9.1* 9.2*  HCT 30.7* 30.7* 31.1*  MCV 95.0 96.5 97.2  PLT 326 276 307   CBG:  Recent Labs Lab 04/27/17 2130 04/28/17 1230 04/28/17 1640 04/28/17 2112 04/29/17 0741  GLUCAP 121* 84 127* 124* 111*   Medications: . sodium chloride    . sodium chloride     . carvedilol  6.25 mg Oral BID WC  . darbepoetin (ARANESP) injection - DIALYSIS  200 mcg Intravenous Q Thu-HD  . dextrose  1 ampule Intravenous Once  . docusate sodium  100 mg Oral BID  . DULoxetine  30 mg Oral Daily  . feeding supplement (PRO-STAT SUGAR FREE 64)  30 mL Oral TID  . [name]'s butt cream   Topical QID   . heparin  5,000 Units Subcutaneous Q8H  . insulin aspart  0-15 Units Subcutaneous TID WC  . insulin glargine  8 Units Subcutaneous Daily  . mouth rinse  15 mL Mouth Rinse BID  . multivitamin  1 tablet Oral QHS  . sevelamer carbonate  1.6 g Oral TID WC  . sodium chloride flush  3 mL Intravenous Q12H    Dialysis Orders: TTS [facility] 4h F200 114kg (NOW ~76kg)2/2 bath LUA AVF Hep none - Iron Sucrose (Venofer) 50 mg IVP weekly - Mircera 75 mcg IV q 2 weeks (last on 02/05/17) - Calcitriol 0.75 mcg PO q HD  Assessment/Plan: 1. Anoxic brain injury: s/p cardiac arrest, ongoing slow improvement in cognition (still with significant functional deficit). 2. Resp failure (s/p trach, now decannulated 7/16): requiring intermittent O2 supplementation when falling asleep with desaturation. 3. ESRD: Continue HD per TTS schedule, no heparin.  4. Debility: Continue efforts at physical therapy for truncal strengthening and training to allow for prolonged sitting and some assistance with transfers to allow OP HD.  5. HTN/ volume:On low-dose coreg 6.25mg  BID primarily for tachycardia. PTA was on midodrine, now off. Monitoring. 6. Anemia of ESRD:Hgb 9.2, on Aranesp weekly. LastTsat 36% on 7/19, no  need for IV iron. 7. Secondary hyperparathyroidism: Calcitriol has been on hold due to higher Ca. PTH low at 88 (03/23/17), 29 (04/28/17). [name] was previously on hold, now resumed d/t higher Phos. Monitor. 8. 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 S/p wound vac. 9. Type 2 DM:On insulin, per primary. 10. Chronic systolic CHF: EF 32-44%. 11. Nutrition: Alb 2.5, continue Pro-stat.  Ozzie Hoyle, PA-C 04/29/2017, 10:06 AM  [facility] Pager: [phone]

## 2017-04-29 NOTE — Progress Notes (Signed)
[facility]                                                                              Patient Demographics  Litsy Epting, is a 36 y.o. female, DOB - Oct 05, 1980, ONG:[id number]  Admit date - 02/14/2017   Admitting Physician Lahoma Crocker, MD  Outpatient Primary MD for the patient is [name], FNP  Outpatient specialists:   LOS - 74  days   Medical records reviewed and are as summarized below:    Chief Complaint  Patient presents with  . Leg Pain  . 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   Admitted on 02/14/17 to [facility] with left foot/leg pain, wound dehiscence and purulent drainage from the left trans-metatarsal amputation site.  Later that day, she was found to be unresponsive and asystolic. CPR started with ROSC in approximately 9 minutes. She was intubated by anaesthesia, transferred to the intensive care unit and subsequently underwent cooling protocol. She couldn't be weaned off the ventilator and as a result underwent tracheostomy on 5/31. She underwent decannulation on 7/16.  Neurological/ Cognitive status has been poor. She has been evaluated by neurology on 5/29 and, per neurology, chances of meaningful neurological recovery appeared to be dismal. Currently she is able to follow simple commands and is alert and is eating.  Hospital course complicated by persistent leukocytosis, respiratory failure and left transmetatarsal wound dehiscence with gangrene. 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  Over the course of her hospitalizations, she has been treated for multiple episodes of recurrent aspiration pneumonias.  Currently off antibiotics and awaiting placement.  The patient is  unable to sit in a normal wheel chair to be transported to the HD due to poor truncal stability.   Assessment & Plan       Acute on chronic respiratory failure with hypoxemia (HCC)In the setting of cardiac arrest status post intubation then tracheostomy, superimposed recurrent aspiration pneumonias during the hospitalization - Status post intubation on 5/19, extubation 5/31, tracheostomy, decannulated on 7/16 - Currently on room air and off antibiotics - Status post MBS on 6/23, s/p FEES 6/27, currently on renal carb diet with thin liquids - Chest x-ray from 7/1 showing improving opacity in the right lung, has completed 14 days of zosyn - Decannulated on 7/16, stable, PCCM signed off   ESRD on hemodialysis since June 17 - TTS per nephrology - Tolerated hemodialysis in chair  Left transmetatarsal stump wound dehiscence with infection and gangrene - Underwent left AKA on 6/15 because of wound dehiscence and osteo-myelitis. Per Operative note, pus/abscess noted extending up to the knee when BKA was attempted and she subsequently underwent AKA. Orthopedics removed the wound VAC on 6/20. - She underwent revision of the AKA on 7/6 with reapplication of wound VAC.  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 Repeat 2-D echo on 6/26 with no significant pericardial effusion  Moderate protein calorie malnutrition Started orals on 6/27, aspiration precautions  Depression - Resumed Cymbalta  Code Status: Full CODE STATUS DVT Prophylaxis:   heparin Family Communication: Discussed in detail with the patient, all imaging results, lab results explained to the patient's mother    Disposition Plan: Awaiting skilled nursing facility  Time Spent in minutes  25 minutes  Procedures:  Lower extremity ultrasound 5/18 >> no evidence of DVT CT head 5/19 >> normal exam Echo 5/20 >>EF 30-35%, increased LVF. Diffuse hypokinesis, akinesis of basilar mid-inferior myocardium, grade 2 diastolic dysfxn  EEG 5/20 >>finding c/w mod to severe global cerebral dysfxn. C/w anoxic injury given clinical course  LE [place] 5/21 >> negative MRI brain 5/24 >> ?thrombosed cortical vein, otherwise normal TEE 6/4 >>mild MR, normal AV, mild TR, mild PR, EF 30-35%, diffuse hypokinesis, no thrombus, no PFO, normal RV, moderate pericardial effusion with synechia suggesting some chronicity, no vegetations   Consultants:   Infectious disease Neurology Gastroenterology Wound care [facility] Nephrology Orthopedics  Antimicrobials:      Medications  Scheduled Meds: . carvedilol  6.25 mg Oral BID WC  . darbepoetin (ARANESP) injection - DIALYSIS  200 mcg Intravenous Q Thu-HD  . dextrose  1 ampule Intravenous Once  . docusate sodium  100 mg Oral BID  . DULoxetine  30 mg Oral Daily  . feeding supplement (PRO-STAT SUGAR FREE 64)  30 mL Oral TID  . [name]'s butt cream   Topical QID  . heparin  5,000 Units Subcutaneous Q8H  . insulin aspart  0-15 Units Subcutaneous TID WC  . insulin glargine  8 Units Subcutaneous Daily  . mouth rinse  15 mL Mouth Rinse BID  . multivitamin  1 tablet Oral QHS  . sevelamer carbonate  1.6 g Oral TID WC  . sodium chloride flush  3 mL Intravenous Q12H    Continuous Infusions: . sodium chloride    . sodium chloride     PRN Meds:.sodium chloride, sodium chloride, acetaminophen **OR** acetaminophen, albuterol, bisacodyl, feeding supplement (NEPRO CARB STEADY), magnesium citrate, ondansetron **OR** ondansetron (ZOFRAN) IV, polyethylene glycol, RESOURCE THICKENUP CLEAR   Antibiotics   Anti-infectives    Start     Dose/Rate Route Frequency Ordered Stop   04/13/17 1000  fluconazole (DIFLUCAN) tablet 100 mg     100 mg Oral Daily 04/12/17 1356 04/15/17 0959   04/08/17 1000  fluconazole (DIFLUCAN) IVPB 100 mg  Status:  Discontinued     100 mg 50 mL/hr over 60 Minutes Intravenous Every 24 hours 04/08/17 0851 04/12/17 1356   04/04/17 0600  ceFAZolin (ANCEF) IVPB 2g/100 mL premix  Status:  Discontinued     2 g 200 mL/hr over 30 Minutes Intravenous On call to O.R. 04/03/17 1840 04/03/17 1848   03/28/17 1600  cefTAZidime (FORTAZ) 1 g in dextrose 5 % 50 mL IVPB  Status:  Discontinued     1 g 100 mL/hr over 30 Minutes Intravenous Every 24 hours 03/27/17 0814 03/28/17 0752   03/28/17 1100  piperacillin-tazobactam (ZOSYN) IVPB 3.375 g     3.375 g 12.5 mL/hr over 240 Minutes Intravenous Every 12 hours 03/28/17 1038 04/11/17 1359   03/27/17 0815  cefTAZidime (FORTAZ) 1 g in dextrose 5 % 50 mL IVPB     1 g 100 mL/hr over 30 Minutes Intravenous  Once 03/27/17 0814 03/27/17 1026   03/17/17 1800  cefTAZidime (FORTAZ) 2 g in dextrose 5 % 50 mL IVPB  Status:  Discontinued     2 g 100 mL/hr over 30 Minutes Intravenous Every T-Th-Sa (1800) 03/15/17 1143 03/18/17 1223   03/17/17 1200  vancomycin (VANCOCIN) IVPB 1000 mg/200 mL premix  Status:  Discontinued     1,000 mg 200 mL/hr over 60 Minutes Intravenous Every T-Th-Sa (Hemodialysis) 03/15/17 1143 03/18/17 1223   03/14/17 1200  vancomycin (VANCOCIN) IVPB 1000 mg/200 mL premix  Status:  Discontinued     1,000 mg 200 mL/hr over 60 Minutes Intravenous Every T-Th-Sa (Hemodialysis) 03/12/17 0912 03/14/17 1542    03/13/17 1430  ceFAZolin (ANCEF) IVPB 2g/100 mL premix  Status:  Discontinued     2 g 200 mL/hr over 30 Minutes Intravenous To ShortStay Surgical 03/12/17 1208 03/13/17 1113   03/13/17 1115  ceFAZolin (ANCEF) IVPB 2g/100 mL premix     2 g 200 mL/hr over 30 Minutes Intravenous To Surgery 03/13/17 1108 03/13/17 1506   03/12/17 1800  cefTAZidime (FORTAZ) 2 g in dextrose 5 % 50 mL IVPB  Status:  Discontinued     2 g 100 mL/hr over 30 Minutes Intravenous Every T-Th-Sa (1800) 03/12/17 0912 03/14/17 1542   03/12/17 1200  vancomycin (VANCOCIN) 1,500 mg in sodium chloride 0.9 % 250 mL IVPB     1,500 mg 250 mL/hr over 60 Minutes Intravenous Every Thu (Hemodialysis) 03/12/17 0912 03/12/17 1356   03/02/17 1200  cefTRIAXone (ROCEPHIN) 2 g in dextrose 5 % 50 mL IVPB     2 g 100 mL/hr over 30 Minutes Intravenous Every 24 hours 03/02/17 0810 03/06/17 1358   03/02/17 1200  vancomycin (VANCOCIN) IVPB 1000 mg/200 mL premix     1,000 mg 200 mL/hr over 60 Minutes Intravenous Every M-W-F (Hemodialysis) 03/02/17 0815 03/02/17 1457   03/02/17 1200  metroNIDAZOLE (FLAGYL) IVPB 500 mg     500 mg 100 mL/hr over 60 Minutes Intravenous Every 8 hours 03/02/17 0948 03/06/17 2230   02/26/17 1200  vancomycin (VANCOCIN) IVPB 1000 mg/200 mL premix     1,000 mg 200 mL/hr over 60 Minutes Intravenous Every T-Th-Sa (Hemodialysis) 02/25/17 1151 03/05/17 1325   02/25/17 1200  vancomycin (VANCOCIN) 2,000 mg in sodium chloride 0.9 % 500 mL IVPB     2,000 mg 250 mL/hr over 120 Minutes Intravenous  Once 02/25/17 1149 02/25/17 1449   02/25/17 0900  cefTRIAXone (ROCEPHIN) 2 g in dextrose 5 % 50 mL IVPB  Status:  Discontinued     2 g 100 mL/hr over 30 Minutes Intravenous Every 24 hours 02/25/17 0814 03/02/17 0810   02/25/17 0900  metroNIDAZOLE (FLAGYL) IVPB 500 mg  Status:  Discontinued     500 mg 100 mL/hr over 60 Minutes Intravenous Every 8 hours 02/25/17 0814 03/02/17 0948   02/20/17 1200  vancomycin (VANCOCIN) IVPB 1000  mg/200 mL premix  Status:  Discontinued     1,000 mg 200 mL/hr over 60 Minutes Intravenous Every M-W-F (Hemodialysis) 02/19/17 1506 02/20/17 1403  02/17/17 1800  meropenem (MERREM) 500 mg in sodium chloride 0.9 % 50 mL IVPB  Status:  Discontinued     500 mg 100 mL/hr over 30 Minutes Intravenous Daily-1800 02/17/17 1026 02/25/17 0814   02/17/17 1200  vancomycin (VANCOCIN) IVPB 1000 mg/200 mL premix     1,000 mg 200 mL/hr over 60 Minutes Intravenous Every T-Th-Sa (Hemodialysis) 02/14/17 1758 02/19/17 1612   02/14/17 2200  clindamycin (CLEOCIN) IVPB 900 mg  Status:  Discontinued     900 mg 100 mL/hr over 30 Minutes Intravenous Every 8 hours 02/14/17 1106 02/14/17 1928   02/14/17 2000  clindamycin (CLEOCIN) IVPB 900 mg  Status:  Discontinued     900 mg 100 mL/hr over 30 Minutes Intravenous Every 8 hours 02/14/17 1928 02/16/17 1047   02/14/17 1915  piperacillin-tazobactam (ZOSYN) IVPB 3.375 g  Status:  Discontinued     3.375 g 100 mL/hr over 30 Minutes Intravenous Every 12 hours 02/14/17 1801 02/17/17 1026   02/14/17 1845  vancomycin (VANCOCIN) 2,000 mg in sodium chloride 0.9 % 500 mL IVPB     2,000 mg 250 mL/hr over 120 Minutes Intravenous  Once 02/14/17 1758 02/14/17 2107   02/14/17 1115  clindamycin (CLEOCIN) IVPB 900 mg  Status:  Discontinued     900 mg 100 mL/hr over 30 Minutes Intravenous  Once 02/14/17 1106 02/14/17 2236   02/14/17 0945  clindamycin (CLEOCIN) IVPB 600 mg     600 mg 100 mL/hr over 30 Minutes Intravenous  Once 02/14/17 0931 02/14/17 1013        Subjective:   Brenleigh Collet was seen and examined today.  Sleepy unable to obtain any review of systems from the patient. Mother at the bedside, states no issues. No fevers or chills.    Objective:   Vitals:   04/28/17 1740 04/28/17 2110 04/29/17 0517 04/29/17 0948  BP: (!) 141/76 (!) 135/96 (!) 136/92 (!) 140/97  Pulse: (!) 101 (!) 108 (!) 104 (!) 107  Resp: 20 19 20 18   Temp: 98.4 F (36.9 C) 98.7 F (37.1 C) 98.7  F (37.1 C) 98.9 F (37.2 C)  TempSrc: Oral Oral Oral Oral  SpO2: 98% 98% 99% 96%  Weight:  75.2 kg (165 lb 12.6 oz)    Height:        Intake/Output Summary (Last 24 hours) at 04/29/17 1325 Last data filed at 04/29/17 0900  Gross per 24 hour  Intake              963 ml  Output                1 ml  Net              962 ml     Wt Readings from Last 3 Encounters:  04/28/17 75.2 kg (165 lb 12.6 oz)  02/12/17 110.2 kg (243 lb)  01/28/17 110.2 kg (243 lb)     Exam  General: Comfortable sleepy, mother at the bedside  Eyes:   HEENT:  Atraumatic, normocephalic, normal oropharynx  Cardiovascular: S1 S2 auscultated, Regular rate and rhythm. Tachycardia  Respiratory: Clear to auscultation bilaterally, no wheezing, rales or rhonchi  Gastrointestinal: Soft, nontender, nondistended, + bowel sounds  Ext: left AKA  Neuro: sleepy, unable to assess  Musculoskeletal: No digital cyanosis, clubbing  Skin: right heel eschar, tattoos on the back  Psych: sleepy unable to assess   Data Reviewed:  I have personally reviewed following labs and imaging studies  Micro Results No results found  for this or any previous visit (from the past 240 hour(s)).  Radiology Reports Dg Chest Port 1 View  Result Date: 04/05/2017 CLINICAL DATA:  SOB. Hx diabetes, HTN, PNA, CAD, COPD. EXAM: PORTABLE CHEST 1 VIEW COMPARISON:  03/29/2017 FINDINGS: Tracheostomy is unchanged in position. Numerous leads and wires project over the chest. Midline trachea. Cardiomegaly accentuated by AP portable technique. No pleural fluid. No pneumothorax. Moderate interstitial prominence and indistinctness. Improved right-sided aeration with patchy left mid and lower lung airspace disease developing. IMPRESSION: Cardiomegaly with similar moderate interstitial prominence, suspicious for pulmonary edema. Shifting airspace opacities which could represent infection or alveolar edema. Electronically Signed   By: Jeronimo Greaves M.D.    On: 04/05/2017 11:24   Dg Swallowing Func-speech Pathology  Result Date: 04/17/2017 Objective Swallowing Evaluation: Type of Study: MBS-Modified Barium Swallow Study Patient Details Name: Levia Waltermire MRN: [id number] Date of Birth: 15-Aug-1981 Today's Date: 04/17/2017 Time: SLP Start Time (ACUTE ONLY): 1100-SLP Stop Time (ACUTE ONLY): 1130 SLP Time Calculation (min) (ACUTE ONLY): 30 min Past Medical History: Past Medical History: Diagnosis Date . Anemia  . Arthritis   "left hand" (09/15/2013) . Asthma  . CHF (congestive heart failure) (HCC)  . Chronic bronchitis (HCC)   "just about q yr" (09/15/2013) . Chronic kidney disease   "low kidney function" (09/15/2013) ,  T/Th/Sa . COPD (chronic obstructive pulmonary disease) (HCC)  . Coronary artery disease  . Hyperlipidemia  . Hypertension  . Migraine   "get them alot" (09/15/2013) . Myocardial infarction [facility]) 04/2015  NSTEMI . Normal coronary arteries   by cardiac catheterization 09/20/13 . Peripheral vascular disease (HCC)  . Pneumonia   "couple times; have it now" (09/15/2013) . PONV (postoperative nausea and vomiting)  . Restless legs  . Shortness of breath   "just recently; related to the pneumonia" (09/15/2013) . Type 1 diabetes mellitus (HCC)   type 2 Past Surgical History: Past Surgical History: Procedure Laterality Date . AMPUTATION Right 06/25/2016  Procedure: Amputation Right Great Toe at the Metatarsophalangeal Joint;  Surgeon: Nadara Mustard, MD;  Location: [facility] OR;  Service: Orthopedics;  Laterality: Right; . AMPUTATION Bilateral 10/08/2016  Procedure: Bilateral Transmetatarsal Amputation;  Surgeon: Nadara Mustard, MD;  Location: [facility] OR;  Service: Orthopedics;  Laterality: Bilateral; . AMPUTATION Left 03/13/2017  Procedure: AMPUTATION ABOVE KNEE;  Surgeon: Nadara Mustard, MD;  Location: [facility] OR;  Service: Orthopedics;  Laterality: Left; . APPLICATION OF WOUND VAC Left 04/03/2017  Procedure: Application of Wound Vacuum;  Surgeon: Nadara Mustard, MD;  Location: [facility] OR;   Service: Orthopedics;  Laterality: Left; . AV FISTULA PLACEMENT Left 03/27/2015  Procedure: CREATION RADIAL CEPHALIC ARTERIOVENOUS FISTULA;  Surgeon: Chuck Hint, MD;  Location: [facility] OR;  Service: Vascular;  Laterality: Left; . AV FISTULA PLACEMENT Left 11/23/2015  Procedure:  LEFT ARM BASILIC VEIN TRANSPOSITION  ;  Surgeon: Chuck Hint, MD;  Location: [facility] OR;  Service: Vascular;  Laterality: Left; . CESAREAN SECTION  1999; 2006 . CORONARY ANGIOGRAM  09/20/2013  Procedure: CORONARY ANGIOGRAM;  Surgeon: Runell Gess, MD;  Location: [facility] CATH LAB;  Service: Cardiovascular;; . FINGER SURGERY Left 1985  3rd and 4th digits reconstructed after cut off" (09/15/2013) . IR FLUORO GUIDE CV LINE RIGHT  03/11/2017 . IR US GUIDE VASC ACCESS RIGHT  03/11/2017 . PERIPHERAL VASCULAR CATHETERIZATION N/A 09/05/2016  Procedure: Abdominal Aortogram;  Surgeon: Sherren Kerns, MD;  Location: [facility] INVASIVE CV LAB;  Service: Cardiovascular;  Laterality: N/A; . PERIPHERAL VASCULAR CATHETERIZATION Bilateral 09/05/2016  Procedure:  Lower Extremity Angiography;  Surgeon: Sherren Kerns, MD;  Location: [facility] INVASIVE CV LAB;  Service: Cardiovascular;  Laterality: Bilateral; . PERIPHERAL VASCULAR CATHETERIZATION Right 09/05/2016  Procedure: Peripheral Vascular Balloon Angioplasty;  Surgeon: Sherren Kerns, MD;  Location: [facility] INVASIVE CV LAB;  Service: Cardiovascular;  Laterality: Right;  peroneal and AT . SHOULDER ARTHROSCOPY WITH BICEPSTENOTOMY Right 05/10/2015  Procedure: RIGHT SHOULDER ARTHROSCOPY WITH BICEPS TENOTOMY, DEBRIDEMENT LABRAL TEAR;  Surgeon: Jones Broom, MD;  Location: [facility] OR;  Service: Orthopedics;  Laterality: Right;  Right shoulder arthroscopy biceps tenotomy, debridement labral tear . STUMP REVISION Left 01/21/2017  Procedure: . Revision Left Transmetatarsal Amputation;  Surgeon: Nadara Mustard, MD;  Location: [facility] OR;  Service: Orthopedics;  Laterality: Left; . STUMP REVISION Left 04/03/2017  Procedure: Revision Left  Above Knee Amputation;  Surgeon: Nadara Mustard, MD;  Location: [facility] OR;  Service: Orthopedics;  Laterality: Left; . TONSILLECTOMY  1997 . TUBAL LIGATION  2006 HPI: Xitlaly Ault a 36 y.o.femalewith a history of ESRD on HD T TH S, COPD/ Asthma , D s/p MI 04/2015, HLD, HTN, CHF, chronic anemia, DM, and a history of L foot partial amputation 09/2016 with revision in 12/2016 for dehiscence, presenting to the ED with worsening LLE pain, swelling, increased drainage at the stump, and chills. ETT 5/19 and trach'd 5/31. Hospital course included cardiac arrest, transfer to ICU No Data Recorded Assessment / Plan / Recommendation CHL IP CLINICAL IMPRESSIONS 04/17/2017 Clinical Impression   Pt demonstrates improved swallow function, particularly with mastication of solids. Pt does still have silent trace penetration events during the swallow as swallow initiation is timely, but hyoid and excursion and complete airway closure is slightly slow intermittently. Despite this, penetration is minimal and is mostly ejected with subsequent swallows. Recommend intermittent throat clears with thin liquids and regular solids. SLP will follow for tolerance.  SLP Visit Diagnosis Dysphagia, oropharyngeal phase (R13.12) Attention and concentration deficit following -- Frontal lobe and executive function deficit following -- Impact on safety and function --   CHL IP TREATMENT RECOMMENDATION 04/17/2017 Treatment Recommendations Therapy as outlined in treatment plan below   Prognosis 03/25/2017 Prognosis for Safe Diet Advancement Good Barriers to Reach Goals Cognitive deficits Barriers/Prognosis Comment -- CHL IP DIET RECOMMENDATION 04/17/2017 SLP Diet Recommendations Regular solids;Thin liquid Liquid Administration via Cup;Straw Medication Administration Whole meds with puree Compensations Slow rate;Small sips/bites;Clear throat intermittently Postural Changes Remain semi-upright after after feeds/meals (Comment)   CHL IP OTHER RECOMMENDATIONS  04/17/2017 Recommended Consults -- Oral Care Recommendations Oral care BID Other Recommendations --   CHL IP FOLLOW UP RECOMMENDATIONS 04/17/2017 Follow up Recommendations Inpatient Rehab   CHL IP FREQUENCY AND DURATION 04/17/2017 Speech Therapy Frequency (ACUTE ONLY) min 2x/week Treatment Duration 2 weeks      CHL IP ORAL PHASE 04/17/2017 Oral Phase WFL Oral - Pudding Teaspoon -- Oral - Pudding Cup -- Oral - Honey Teaspoon -- Oral - Honey Cup -- Oral - Nectar Teaspoon -- Oral - Nectar Cup -- Oral - Nectar Straw -- Oral - Thin Teaspoon -- Oral - Thin Cup -- Oral - Thin Straw -- Oral - Puree -- Oral - Mech Soft -- Oral - Regular -- Oral - Multi-Consistency -- Oral - Pill -- Oral Phase - Comment --  CHL IP PHARYNGEAL PHASE 04/17/2017 Pharyngeal Phase Impaired Pharyngeal- Pudding Teaspoon -- Pharyngeal -- Pharyngeal- Pudding Cup -- Pharyngeal -- Pharyngeal- Honey Teaspoon -- Pharyngeal -- Pharyngeal- Honey Cup -- Pharyngeal -- Pharyngeal- Nectar Teaspoon NT Pharyngeal -- Pharyngeal- Nectar Cup NT Pharyngeal --  Pharyngeal- Nectar Straw -- Pharyngeal -- Pharyngeal- Thin Teaspoon NT Pharyngeal -- Pharyngeal- Thin Cup Penetration/Aspiration during swallow Pharyngeal Material enters airway, CONTACTS cords and then ejected out;Material enters airway, remains ABOVE vocal cords and not ejected out;Material does not enter airway Pharyngeal- Thin Straw Penetration/Aspiration during swallow Pharyngeal Material does not enter airway;Material enters airway, remains ABOVE vocal cords and not ejected out;Material enters airway, CONTACTS cords and then ejected out Pharyngeal- Puree WFL Pharyngeal -- Pharyngeal- Mechanical Soft WFL Pharyngeal -- Pharyngeal- Regular WFL Pharyngeal -- Pharyngeal- Multi-consistency -- Pharyngeal -- Pharyngeal- Pill WFL Pharyngeal -- Pharyngeal Comment --  No flowsheet data found. No flowsheet data found. DeBlois, [name] 04/17/2017, 2:02 PM               Lab Data:  CBC:  Recent Labs Lab  04/23/17 1554 04/25/17 1247 04/28/17 0801  WBC 7.9 7.9 9.0  HGB 9.4* 9.1* 9.2*  HCT 30.7* 30.7* 31.1*  MCV 95.0 96.5 97.2  PLT 326 276 307   Basic Metabolic Panel:  Recent Labs Lab 04/23/17 1554 04/25/17 1248 04/28/17 0801  NA 131* 131* 136  K 4.2 4.0 3.9  CL 94* 94* 99*  CO2 26 26 26   GLUCOSE 152* 128* 92  BUN 45* 41* 60*  CREATININE 5.16* 4.61* 5.52*  CALCIUM 8.9 9.1 9.2  9.0  PHOS 5.8* 4.5 4.0   GFR: Estimated Creatinine Clearance: 14.3 mL/min (A) (by C-G formula based on SCr of 5.52 mg/dL (H)). Liver Function Tests:  Recent Labs Lab 04/23/17 1554 04/25/17 1248 04/28/17 0801  ALBUMIN 2.4* 2.5* 2.5*   No results for input(s): LIPASE, AMYLASE in the last 168 hours. No results for input(s): AMMONIA in the last 168 hours. Coagulation Profile: No results for input(s): INR, PROTIME in the last 168 hours. Cardiac Enzymes: No results for input(s): CKTOTAL, CKMB, CKMBINDEX, TROPONINI in the last 168 hours. BNP (last 3 results) No results for input(s): PROBNP in the last 8760 hours. HbA1C: No results for input(s): HGBA1C in the last 72 hours. CBG:  Recent Labs Lab 04/28/17 1230 04/28/17 1640 04/28/17 2112 04/29/17 0741 04/29/17 1135  GLUCAP 84 127* 124* 111* 164*   Lipid Profile: No results for input(s): CHOL, HDL, LDLCALC, TRIG, CHOLHDL, LDLDIRECT in the last 72 hours. Thyroid Function Tests: No results for input(s): TSH, T4TOTAL, FREET4, T3FREE, THYROIDAB in the last 72 hours. Anemia Panel: No results for input(s): VITAMINB12, FOLATE, FERRITIN, TIBC, IRON, RETICCTPCT in the last 72 hours. Urine analysis:    Component Value Date/Time   COLORURINE AMBER (A) 02/14/2017 2111   APPEARANCEUR HAZY (A) 02/14/2017 2111   LABSPEC 1.015 02/14/2017 2111   PHURINE 5.0 02/14/2017 2111   GLUCOSEU NEGATIVE 02/14/2017 2111   HGBUR SMALL (A) 02/14/2017 2111   BILIRUBINUR NEGATIVE 02/14/2017 2111   KETONESUR NEGATIVE 02/14/2017 2111   PROTEINUR 30 (A) 02/14/2017  2111   UROBILINOGEN 0.2 09/21/2014 1908   NITRITE NEGATIVE 02/14/2017 2111   LEUKOCYTESUR TRACE (A) 02/14/2017 2111     Kehinde Totzke M.D. [facility] 04/29/2017, 1:25 PM  Pager: [phone] Between 7am to 7pm - call Pager - [phone]  After 7pm go to [email or web address] - password [email or web address]  Call night coverage person covering after 7pm

## 2017-04-29 NOTE — Progress Notes (Signed)
Physical Therapy Treatment Patient Details Name: [name] MRN: [id number] DOB: Jan 02, 1981 Today's Date: 04/29/2017    History of Present Illness [name] is a 36 y.o. 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 ETT 5/19 and trach'd 5/31. Trach removed 7/16.    PT Comments    Continuing work on functional mobility and activity tolerance; Pt presented awake and alert in bed, with improved cognition and ability to hold basic conversation and answer questions appropriately. Pt needed min assist for all bed mobility and mod assist with slide-board transfer from bed to chair. Pt was able to remember the routine of the transfer and moved quicker with more efficiency using RLE WBAT to help with transfer. Continue to recommend post-acute rehab.     Follow Up Recommendations  SNF     Equipment Recommendations  Other (comment) (TBD)    Recommendations for Other Services       Precautions / Restrictions Precautions Precautions: Fall Restrictions Weight Bearing Restrictions: Yes RLE Weight Bearing: Non weight bearing LLE Weight Bearing: Weight bearing as tolerated Other Position/Activity Restrictions: Prevalon Boot   Mobility  Bed Mobility Overal bed mobility: Needs Assistance Bed Mobility: Sidelying to Sit;Rolling;Sit to Sidelying Rolling: Min assist;+2 for safety/equipment Sidelying to sit: Mod assist;+2 for physical assistance;+2 for safety/equipment     Sit to sidelying: Min assist;+2 for safety/equipment General bed mobility comments: Pt able to grasp rail and pull trunk to side with min assist. Pt needed cueing for most bed mobility, but was able to perform the tasks with minimal  assist.   Transfers Overall transfer level: Needs assistance Equipment used: 2 person hand held assist Transfers: Lateral/Scoot Transfers (with sliding-board )          Lateral/Scoot Transfers: Mod assist;+2 physical assistance;+2 safety/equipment;With slide board General transfer comment: Pt was able to transfer from EOB to chair and back with mod assist, while WBAT through RLE. Pt remembered the transfer routine and was able to tranfer quicker and with more efficiency. Frequent rest breaks were needed due to dizziness and regaining orientation.  Ambulation/Gait                 Stairs            Wheelchair Mobility    Modified Rankin (Stroke Patients Only)       Balance Overall balance assessment: Needs assistance Sitting-balance support: Bilateral upper extremity supported;Feet supported Sitting balance-Leahy Scale: Poor Sitting balance - Comments: Pt was able to balance while sitting with support from both UE with min guard to min assist for safety. Still with trunk weakness, causing decrease sitting balance. Postural control: Posterior lean                                  Cognition Arousal/Alertness: Awake/alert Behavior During Therapy: WFL for tasks assessed/performed Overall Cognitive Status: Impaired; see SLP note for further details.                        Memory:  (No memory of last PT session ) Following Commands: Follows one step commands consistently  General Comments: Pt's interaction improved from last session; remembered familiar faces, and was able to engage in casual conversation. Pt stated she was unable to remember transfering to chair via sliding-board on Monday's PT session; however, her actions resembled familiarity with transfer process.      General Comments General comments (skin integrity, edema, etc.): Pt's performance increased from last session. Pt was able to converse with those in the room without  getting distracted and losing focus on task. Pt was able to move quicker and with more efficiency.      Pertinent Vitals/Pain Pain Assessment: No/denies pain    Home Living                      Prior Function            PT Goals (current goals can now be found in the care plan section) Acute Rehab PT Goals Patient Stated Goal: to feel better, less tired PT Goal Formulation: With patient Time For Goal Achievement: 04/13/17 Potential to Achieve Goals: Good Progress towards PT goals: Progressing toward goals    Frequency    Min 3X/week      PT Plan Current plan remains appropriate    Co-evaluation PT/OT/SLP Co-Evaluation/Treatment: Yes Reason for Co-Treatment: Complexity of the patient's impairments (multi-system involvement);For patient/therapist safety PT goals addressed during session: Mobility/safety with mobility;Balance OT goals addressed during session: Strengthening/ROM      AM-PAC PT "6 Clicks" Daily Activity  Outcome Measure  Difficulty turning over in bed (including adjusting bedclothes, sheets and blankets)?: Total Difficulty moving from lying on back to sitting on the side of the bed? : Total Difficulty sitting down on and standing up from a chair with arms (e.g., wheelchair, bedside commode, etc,.)?: Total Help needed moving to and from a bed to chair (including a wheelchair)?: A Lot Help needed walking in hospital room?: Total Help needed climbing 3-5 steps with a railing? : Total 6 Click Score: 7    End of Session Equipment Utilized During Treatment: Other (comment) (Sliding Board) Activity Tolerance: Patient tolerated treatment well;Patient limited by fatigue (Pt stated "I'm tired" on multiple occassions ) Patient left: in bed;with call bell/phone within reach   PT Visit Diagnosis: Muscle weakness (generalized) (M62.81);Other abnormalities of gait and mobility (R26.89);Dizziness and giddiness (R42)     Time: 1610-9604 PT Time  Calculation (min) (ACUTE ONLY): 29 min  Charges:  $Therapeutic Activity: 8-22 mins                    G Codes:       Romie Jumper, SPT Acute Rehabilitation Services Office: [phone]   Sherron Ales 04/29/2017, 2:42 PM

## 2017-04-29 NOTE — Progress Notes (Addendum)
  Speech Language Pathology Treatment: Cognitive-Linquistic  Patient Details Name: [name] MRN: [id number] DOB: 07/22/1981 Today's Date: 04/29/2017 Time: 1829-9371 SLP Time Calculation (min) (ACUTE ONLY): 20 min  Assessment / Plan / Recommendation Clinical Impression  Pt seen for cognitive treatment. Pt had completed memory pages for 7/31 which were provided by SLP previous date. Provided mod verbal cues to assist pt in completing page for this date. Mother at bedside provided information to add to the page (visitor, doctor visit) as pt unable to recall the events of the morning. Also noted some long-term memory deficits as well as pt unable to tell age of her daughter. Pt continues to demonstrate working memory deficits  which impacted ability to complete simple math problems. Reviewed findings with pt and mother, encouraged to continue completing memory page as events come up during the day. Plan to provide calendar and additional memory/ problem solving activities for pt to complete at bedside. Will continue to follow for cognition.   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 Pt found unresponsive and code blue activated 5/19 as RN placing leads for cardiac monitoring (uncertain length of unresponsiveness), s/p anoxic brain injury, intubated and trach'd 5/31. Hospital course included cardiac arrest, transfer to ICU. Pt post decannulation of tracheostomy.      SLP Plan  Continue with current plan of care       Recommendations  Diet recommendations: Regular;Thin liquid Liquids provided via: Cup;Straw                Oral Care Recommendations: Oral care BID Follow up Recommendations: Skilled  Nursing facility SLP Visit Diagnosis: Cognitive communication deficit (I96.789) Plan: Continue with current plan of care       GO                Beth Kung, MA, CCC-SLP 04/29/2017, 12:38 PM [id number]

## 2017-04-29 NOTE — Clinical Social Work Note (Signed)
CSW continuing to work on SNF placement for patient and admissions director with [facility] contacted and conversation had regarding patient being restrained in a wheelchair.Ms. [name], admission director advised that patient has not been restrained (per admissions director, this information came from patient's mother) and CSW verified with staff (nurse and nurse techs) that patient has not been restrained while sitting in the wheelchair. Ms. [name] indicated that she would talk with administration at [facility] and get back with CSW.  CSW contacted later (4:42 pm) by [facility] admissions director and advised that they can take patient tomorrow as they would not be able to get transportation arranged for patient on Thursday, but will have it arranged for Saturday and ongoing. MD and charge nurse informed. CSW will continue to follow and facilitate discharge to [facility] on 8/2 after dialysis.  Genelle Bal, MSW, LCSW Licensed Clinical Social Worker Clinical Social Work Department [employer] [phone]

## 2017-04-30 ENCOUNTER — Inpatient Hospital Stay (INDEPENDENT_AMBULATORY_CARE_PROVIDER_SITE_OTHER): Payer: Medicaid Other | Admitting: Family

## 2017-04-30 LAB — CBC
HEMATOCRIT: 33.2 % — AB (ref 36.0–46.0)
HEMOGLOBIN: 9.6 g/dL — AB (ref 12.0–15.0)
MCH: 28.1 pg (ref 26.0–34.0)
MCHC: 28.9 g/dL — ABNORMAL LOW (ref 30.0–36.0)
MCV: 97.1 fL (ref 78.0–100.0)
Platelets: 316 10*3/uL (ref 150–400)
RBC: 3.42 MIL/uL — ABNORMAL LOW (ref 3.87–5.11)
RDW: 16.1 % — AB (ref 11.5–15.5)
WBC: 9.3 10*3/uL (ref 4.0–10.5)

## 2017-04-30 LAB — RENAL FUNCTION PANEL
ANION GAP: 10 (ref 5–15)
Albumin: 2.5 g/dL — ABNORMAL LOW (ref 3.5–5.0)
BUN: 53 mg/dL — AB (ref 6–20)
CHLORIDE: 99 mmol/L — AB (ref 101–111)
CO2: 25 mmol/L (ref 22–32)
Calcium: 9.4 mg/dL (ref 8.9–10.3)
Creatinine, Ser: 4.42 mg/dL — ABNORMAL HIGH (ref 0.44–1.00)
GFR, EST AFRICAN AMERICAN: 14 mL/min — AB (ref >60)
GFR, EST NON AFRICAN AMERICAN: 12 mL/min — AB (ref >60)
Glucose, Bld: 89 mg/dL (ref 65–99)
PHOSPHORUS: 3.8 mg/dL (ref 2.5–4.6)
POTASSIUM: 4.4 mmol/L (ref 3.5–5.1)
Sodium: 134 mmol/L — ABNORMAL LOW (ref 135–145)

## 2017-04-30 LAB — GLUCOSE, CAPILLARY
GLUCOSE-CAPILLARY: 118 mg/dL — AB (ref 65–99)
Glucose-Capillary: 88 mg/dL (ref 65–99)

## 2017-04-30 MED ORDER — INSULIN ASPART 100 UNIT/ML ~~LOC~~ SOLN: 0.0000 [IU] | Freq: Three times a day (TID) | SUBCUTANEOUS | 11 refills | Status: AC

## 2017-04-30 MED ORDER — DARBEPOETIN ALFA 200 MCG/0.4ML IJ SOSY
PREFILLED_SYRINGE | INTRAMUSCULAR | Status: AC
  Administered 2017-04-30: 200 ug via INTRAVENOUS
  Filled 2017-04-30: qty 0.4

## 2017-04-30 NOTE — Progress Notes (Signed)
Patient ID: [name], female   DOB: 1981/03/27, 36 y.o.   MRN: [id number]  [facility] Progress Note   Assessment/ Plan:   1. Anoxic brain injury: s/p cardiac arrest, continues to show slowimprovement in cognition (still with significant functional deficit). Plans for DC to SNF when available.  2. Resp failure (s/p trach, now decannulated 7/16): monitoring oxygen saturation for PRN supplementation. 3. ESRD:Continue HD today per TTS schedule, no heparin.  4. Debility: Continue efforts at physical therapy for truncal strengthening and training - now able to sit for prolonged times and with some assistance with transfers .  5. HTN/ volume:On low-dose coreg 6.25mg  BID primarily for tachycardia.  6. Anemia of ESRD:Hgb 9.2, on Aranesp weekly. LastTsat 36% on 7/19, no need for IV iron. 7. Secondary hyperparathyroidism: On sevelamer for phosphorus binding- not on calcitriol (hypercalcemia) 8. 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 S/p wound vac. 9. Type 2 DM:On insulin, per primary. 10. Chronic systolic CHF: EF 75-91%. 11. Nutrition: Alb 2.5, continue Pro-stat.  Subjective:   No acute events overnight   Objective:   BP 139/90   Pulse (!) 105   Temp 98.2 F (36.8 C) (Oral)   Resp (!) 22   Ht 5' 8.5" (1.74 m)   Wt 75.2 kg (165 lb 12.6 oz)   LMP  (LMP Unknown) Comment: Patient trached  SpO2 97%   BMI 24.84 kg/m   Physical Exam: MBW:[id number] resting in dialysis. XBL:TJQZE RRR, Normal S1 and S2 Resp:CTA bilaterally, no rales/rhonchi SPQ:ZRAQ, flat, non-tender, BS normal Ext: Trace LE edema right leg. S/p Left AKA  Labs: BMET  Recent Labs Lab 04/23/17 1554 04/25/17 1248 04/28/17 0801 04/30/17 0800  NA 131* 131* 136 134*  K 4.2 4.0 3.9 4.4  CL 94* 94* 99* 99*  CO2 26 26 26 25   GLUCOSE 152* 128* 92 89  BUN 45* 41* 60* 53*  CREATININE 5.16* 4.61* 5.52* 4.42*  CALCIUM 8.9 9.1 9.2  9.0 9.4  PHOS 5.8* 4.5 4.0 3.8   CBC  Recent  Labs Lab 04/23/17 1554 04/25/17 1247 04/28/17 0801 04/30/17 0349  WBC 7.9 7.9 9.0 9.3  HGB 9.4* 9.1* 9.2* 9.6*  HCT 30.7* 30.7* 31.1* 33.2*  MCV 95.0 96.5 97.2 97.1  PLT 326 276 307 316   Medications:    . carvedilol  6.25 mg Oral BID WC  . darbepoetin (ARANESP) injection - DIALYSIS  200 mcg Intravenous Q Thu-HD  . dextrose  1 ampule Intravenous Once  . docusate sodium  100 mg Oral BID  . DULoxetine  30 mg Oral Daily  . feeding supplement (PRO-STAT SUGAR FREE 64)  30 mL Oral TID  . [name]'s butt cream   Topical QID  . heparin  5,000 Units Subcutaneous Q8H  . insulin aspart  0-15 Units Subcutaneous TID WC  . insulin glargine  8 Units Subcutaneous Daily  . mouth rinse  15 mL Mouth Rinse BID  . multivitamin  1 tablet Oral QHS  . sevelamer carbonate  1.6 g Oral TID WC  . sodium chloride flush  3 mL Intravenous Q12H   Zetta Bills, MD 04/30/2017, 9:07 AM

## 2017-04-30 NOTE — Discharge Summary (Signed)
Physician Discharge Summary   Patient ID: [name] MRN: [id number] DOB/AGE: 1981-07-02 36 y.o.  Admit date: 02/14/2017 Discharge date: 04/30/2017  Primary Care Physician:  [name], FNP  Discharge Diagnoses:     Acute on chronic respiratory failure with hypoxia  Cardiac arrest  Intubation, tracheostomy  Recurrent aspiration pneumonias during the hospitalization  ESRD on hemodialysis  Left transmetatarsal stump wound dehiscence with infection and gangrene  Right heel eschar/pressure ulcers on the sacral area  Anoxic brain injury secondary to cardiac arrest on 5/19  Normocytic anemia . Osteomyelitis (HCC) . 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 Hyperlipidemia . Hypertension . Morbid obesity-  . Normocytic anemia . 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 Diabetic osteomyelitis (HCC) . Elevated liver enzymes . Acute combined systolic and diastolic heart failure (HCC) . Chronic pain syndrome . Dehiscence of amputation stump (HCC)   Consults:   Infectious disease Neurology Gastroenterology Wound care [facility] CM Nephrology Orthopedics  Recommendations for Outpatient Follow-up:  WOC recommendations: Cleanse and apply Purple Top Criticaid Cream BID and PRN incontinence of urine or stool - Air mattress   DIET: Renal carb modified diet with fluid restriction, and thin liquids No pork, pills whole in pure, clear throat intermittently    Allergies:   Allergies  Allergen Reactions  . Aspirin Anaphylaxis  . Sulfur Hives  . Tramadol Hives and Other (See Comments)    Pt states she feels weird   . Gabapentin Other (See Comments)    Lethargic      DISCHARGE MEDICATIONS: Current Discharge Medication List    START taking these medications   Details  Amino Acids-Protein Hydrolys (FEEDING SUPPLEMENT, PRO-STAT SUGAR FREE 64,) LIQD Take 30 mLs by mouth 3 (three) times daily. Qty: 900 mL, Refills: 0    bisacodyl (DULCOLAX) 10 MG  suppository Place 1 suppository (10 mg total) rectally daily as needed for moderate constipation. Qty: 12 suppository, Refills: 0    Darbepoetin Alfa (ARANESP) 200 MCG/0.4ML SOSY injection Inject 0.4 mLs (200 mcg total) into the vein every Thursday with hemodialysis. Qty: 1.68 mL    docusate sodium (COLACE) 100 MG capsule Take 1 capsule (100 mg total) by mouth 2 (two) times daily. Qty: 10 capsule, Refills: 0    Hydrocortisone ([name]'S BUTT CREAM) CREA Apply 1 application topically 4 (four) times daily.    insulin aspart (NOVOLOG) 100 UNIT/ML injection Inject 0-15 Units into the skin 3 (three) times daily with meals. Sliding scale  CBG 70 - 120: 0 units: CBG 121 - 150: 2 units; CBG 151 - 200: 3 units; CBG 201 - 250: 5 units; CBG 251 - 300: 8 units;CBG 301 - 350: 11 units; CBG 351 - 400: 15 units; CBG > 400 : 15 units and notify MD Qty: 10 mL, Refills: 11    insulin glargine (LANTUS) 100 UNIT/ML injection Inject 0.08 mLs (8 Units total) into the skin daily. Qty: 10 mL, Refills: 11    Maltodextrin-Xanthan Gum (RESOURCE THICKENUP CLEAR) POWD USE AS NEEDED.    Nutritional Supplements (FEEDING SUPPLEMENT, NEPRO CARB STEADY,) LIQD Take 237 mLs by mouth 2 (two) times daily as needed (missed meals due to HD or eating <50% of meal). Refills: 0    sevelamer carbonate (RENVELA) 0.8 g PACK packet Take 1.6 g by mouth 3 (three) times daily with meals. Qty: 270 each      CONTINUE these medications which have CHANGED   Details  carvedilol (COREG) 6.25 MG tablet Take 1 tablet (6.25 mg total) by  mouth 2 (two) times daily with a meal.      CONTINUE these medications which have NOT CHANGED   Details  albuterol (PROVENTIL HFA;VENTOLIN HFA) 108 (90 BASE) MCG/ACT inhaler Inhale 2 puffs into the lungs every 6 (six) hours as needed for wheezing or shortness of breath.     albuterol (PROVENTIL) (2.5 MG/3ML) 0.083% nebulizer solution Take 2.5 mg by nebulization every 6 (six) hours as needed for wheezing or  shortness of breath.    budesonide (PULMICORT) 0.25 MG/2ML nebulizer solution Take 0.25 mg by nebulization 2 (two) times daily as needed (shortness of breath or wheezing).    collagenase (SANTYL) ointment Apply 1 application topically daily. Qty: 15 g, Refills: 0    DULoxetine (CYMBALTA) 30 MG capsule Take 1 capsule (30 mg total) by mouth daily. Qam Qty: 30 capsule, Refills: 3    multivitamin (RENA-VIT) TABS tablet Take 1 tablet by mouth daily.      STOP taking these medications     atorvastatin (LIPITOR) 40 MG tablet      diphenhydrAMINE (BENADRYL) 25 MG tablet      hydrOXYzine (ATARAX/VISTARIL) 25 MG tablet      insulin aspart (NOVOLOG FLEXPEN) 100 UNIT/ML FlexPen      Insulin Glargine (LANTUS SOLOSTAR) 100 UNIT/ML Solostar Pen      isosorbide mononitrate (ISMO,MONOKET) 20 MG tablet      meclizine (ANTIVERT) 25 MG tablet      oxyCODONE (ROXICODONE) 5 MG immediate release tablet      oxyCODONE-acetaminophen (ROXICET) 5-325 MG tablet      pregabalin (LYRICA) 50 MG capsule      sevelamer carbonate (RENVELA) 800 MG tablet      tiZANidine (ZANAFLEX) 4 MG tablet          Brief H and P: For complete details please refer to admission H and P, but in brief Patient is a 36 y.o.femalewith history of medical noncompliance, ESRD on HD, poorly controlled diabetes, PVD, 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   Admitted on 02/14/17 to [facility] with left foot/leg pain, wound dehiscence and purulent drainage from the left trans-metatarsal amputation site.  Later that day, she wasfound to be unresponsive andasystolic. CPR started with ROSC in approximately 9 minutes. She was intubated by anaesthesia, transferred to the intensive care unit and subsequently underwent cooling protocol. She couldn't be weaned off the ventilator and as a result underwent  tracheostomy on 5/31. She underwent decannulation on 7/16.  Neurological/ Cognitive status has been poor. She has been evaluated by neurology on 5/29 and, per neurology, chances of meaningful neurological recovery appeared to be dismal. Currently she is able to follow simple commands and is alert and is eating.  Hospital course complicated by persistent leukocytosis, respiratory failure and left transmetatarsal wound dehiscence with gangrene. 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  Over the course of her hospitalizations, she has been treated for multiple episodes of recurrent aspiration pneumonias.  I assumed care on 04/29/17  Hospital Course:     Acute on chronic respiratory failure with hypoxemia (HCC)In the setting of cardiac arrest status post intubation then tracheostomy, superimposed recurrent aspiration pneumonias during the hospitalization - Status post intubation on 5/19, extubation 5/31,then tracheostomy, decannulated on 7/16 - Currently on room air, O2 sats 97% and off antibiotics - Status post MBS on 6/23, s/p FEES 6/27, currently on renal carb diet with thin liquids, tolerating diet. - Chest x-ray from 7/1 showing  improving opacity in the right lung, has completed 14 days of zosyn - Decannulated on 7/16, stable, critical care has signed off.   ESRD on hemodialysis since June 17 - TTS per nephrology - Tolerated hemodialysis in chair, receiving hemodialysis on 8/2 prior to discharge.  Left transmetatarsal stump wound dehiscence with infection and gangrene - Underwent left AKA on 6/15 because of wound dehiscence and osteomyelitis. Per Operative note, pus/abscess noted extending up to the knee when BKA was attempted and she subsequently underwent AKA. Orthopedics removed the wound VAC on 6/20. - She underwent revision of the AKA on 7/6 with reapplication of wound VAC.  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 Repeat 2-D echo on 6/26 with no significant pericardial effusion  Moderate protein calorie malnutrition Started orals on 6/27, aspiration precautions  Depression - Resumed Cymbalta   Day of Discharge BP 136/84   Pulse (!) 109   Temp 98.2 F (36.8 C) (Oral)   Resp (!) 22   Ht 5' 8.5" (1.74 m)   Wt 75.2 kg (165 lb 12.6 oz)   LMP  (LMP Unknown) Comment: Patient trached  SpO2 97%   BMI 24.84 kg/m   Physical Exam: General: Comfortable, NAD HEENT: anicteric sclera, pupils reactive to light and accommodation CVS: S1-S2 clear no murmur rubs or gallops Chest: clear to auscultation bilaterally, no wheezing rales or rhonchi Abdomen: soft nontender, nondistended, normal bowel sounds Extremities: Left AKA    The results of significant diagnostics from this hospitalization (including imaging, microbiology, ancillary and laboratory) are listed below for reference.    LAB RESULTS: Basic Metabolic Panel:  Recent Labs Lab 04/28/17 0801 04/30/17 0800  NA 136 134*  K 3.9 4.4  CL 99* 99*  CO2 26 25  GLUCOSE 92 89  BUN 60* 53*  CREATININE 5.52* 4.42*  CALCIUM 9.2  9.0 9.4  PHOS 4.0 3.8   Liver Function Tests:  Recent Labs Lab 04/28/17 0801 04/30/17 0800  ALBUMIN 2.5* 2.5*   No results for  input(s): LIPASE, AMYLASE in the last 168 hours. No results for input(s): AMMONIA in the last 168 hours. CBC:  Recent Labs Lab 04/28/17 0801 04/30/17 0349  WBC 9.0 9.3  HGB 9.2* 9.6*  HCT 31.1* 33.2*  MCV 97.2 97.1  PLT 307 316   Cardiac Enzymes: No results for input(s): CKTOTAL, CKMB, CKMBINDEX, TROPONINI in the last 168 hours. BNP: Invalid input(s): POCBNP CBG:  Recent Labs Lab 04/29/17 1710 04/29/17 2043  GLUCAP 95 153*    Significant Diagnostic Studies:  Dg Chest 2 View  Result Date: 02/14/2017 CLINICAL DATA:  Hypoxia. Pt came to ED with left foot pain post amputation of toes. Hx diabetes, PNA, COPD, HTN, MI, CAD, CKD, CHF. EXAM: CHEST  2 VIEW COMPARISON:  04/22/2016 FINDINGS: Moderate enlargement of the cardiopericardial silhouette, stable. No mediastinal or  hilar masses. No evidence of adenopathy. Clear lungs.  No pleural effusion.  No pneumothorax. Skeletal structures are unremarkable. IMPRESSION: 1. No acute cardiopulmonary disease. 2. Stable moderate cardiomegaly. Electronically Signed   By: Amie Portland M.D.   On: 02/14/2017 09:26   Ct Head Wo Contrast  Result Date: 02/14/2017 CLINICAL DATA:  Cardiac arrest and, EXAM: CT HEAD WITHOUT CONTRAST TECHNIQUE: Contiguous axial images were obtained from the base of the skull through the vertex without intravenous contrast. COMPARISON:  None. FINDINGS: Brain: No mass lesion, intraparenchymal hemorrhage or extra-axial collection. No evidence of acute cortical infarct. Brain parenchyma and CSF-containing spaces are normal for age. Mildly asymmetric hypoattenuation of the right temporal lobe relative to the left is likely artifactual Vascular: No hyperdense vessel or unexpected calcification. Skull: Normal visualized skull base, calvarium and extracranial soft tissues. Sinuses/Orbits: No sinus fluid levels or advanced mucosal thickening. No mastoid effusion. Normal orbits. IMPRESSION: Normal head CT. Electronically Signed   By: Deatra Robinson M.D.   On: 02/14/2017 22:23   Dg Chest Port 1 View  Result Date: 02/14/2017 CLINICAL DATA:  Central line placement EXAM: PORTABLE CHEST 1 VIEW COMPARISON:  02/14/2017 at 0913 hours FINDINGS: Endotracheal tube terminates 4.5 cm above the carina. Lungs are clear.  No pleural effusion or pneumothorax. Cardiomegaly. Left IJ venous catheter terminates at cavoatrial junction. Enteric tube courses into the stomach. Defibrillator pads overlying the left hemithorax. IMPRESSION: Endotracheal tube terminates 4.5 cm above the carina. Left IJ venous catheter terminates at the cavoatrial junction. Electronically Signed   By: Charline Bills M.D.   On: 02/14/2017 18:31   Dg Foot Complete Left  Result Date: 02/14/2017 CLINICAL DATA:  Pt c/o severe pain and inflammation in left foot. 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 Weeping evident at site of amputation. Pt reports nausea today. Hx diabetes, HTN, CKD. EXAM: LEFT FOOT - COMPLETE 3+ VIEW COMPARISON:  12/29/2016 FINDINGS: Postop changes including resection of the proximal first and fourth metatarsals since prior exam. Small proximal fragments of the second third and fifth metatarsals are identified. There is some indistinctness of the resection margin of the third and fifth metatarsal fragments suggesting osteomyelitis. There is a soft tissue defect distal to the resection margin seen best on the lateral projection. IMPRESSION: 1. Interval postop changes. Indistinctness of residual third and fifth metatarsal fragments, suggesting osteomyelitis. Electronically Signed   By: Corlis Leak M.D.   On: 02/14/2017 08:15    2D ECHO: Study Conclusions  - Left ventricle: The cavity size was normal. There was mild   concentric hypertrophy. Systolic function was severely reduced.   The estimated ejection fraction was in the range of 25% to 30%.   Diffuse hypokinesis. Regional wall motion abnormalities cannot be   excluded. - Mitral valve: There was  mild regurgitation. - Pericardium, extracardiac: A trivial pericardial effusion was   identified circumferential to the heart.  Disposition and Follow-up: Discharge Instructions    Diet - low sodium heart healthy    Complete by:  As directed    Discharge instructions    Complete by:  As directed    PLEASE FOLLOW UP WITH PCP in one week post discharge from the SNF.   Increase activity slowly    Complete by:  As directed        DISPOSITION:SNF   DISCHARGE FOLLOW-UP  Contact information for follow-up providers    Nadara Mustard, MD Follow up in 1 week(s).   Specialty:  Orthopedic Surgery Why:  Appointment date  on 04/30/2017 at 10:45a  Contact information: [address] [place] [address] [phone]        [name], FNP. Schedule an appointment as soon as possible for a visit in 2 week(s).   Specialty:  Nurse Practitioner Contact information: [address] [place] [address] [phone]            Contact information for after-discharge care    Destination    [facility] Follow up.   Specialty:  Skilled Nursing Facility Contact information: [address] [place] [address] [phone]                   Time spent on Discharge:   Signed:   Thad Ranger M.D. [facility] 04/30/2017, 12:08 PM Pager: [phone]

## 2017-04-30 NOTE — Discharge Summary (Signed)
Pt given discharge instructions, follow up info, and prescriptions. Denies questions.  

## 2017-04-30 NOTE — Clinical Social Work Note (Signed)
Clinical Social Work Assessment  Patient Details  Name: [name] MRN: [id number] Date of Birth: May 26, 1981  Date of referral:  04/14/17               Reason for consult:  Facility Placement                Permission sought to share information with:  Family Supports Permission granted to share information::  No (Not not totally oriented and conversations had with mother who is usually at the bedside.)  Name::     [name]  Agency::     Relationship::  Mother  Contact Information:  [phone]  Housing/Transportation Living arrangements for the past 2 months:  Single Family Home Source of Information:  Parent Patient Interpreter Needed:  None Criminal Activity/Legal Involvement Pertinent to Current Situation/Hospitalization:  No - Comment as needed Significant Relationships:  Parents, Other Family Members Lives with:  Parents ([name] (mother) lives with her daughter) Do you feel safe going back to the place where you live?  No Need for family participation in patient care:  Yes (Comment)  Care giving concerns:  CSW talked with patient's mother at the bedside on 7/26 and she is in agreement with facility placement for patient. [name] indicated that she would not be able to adequately care for patient at home at this time.   Social Worker assessment / plan:  CSW talked with patient's mother regarding discharge disposition as patient was sleepy and lethargic during the visit. [name] had been working with another CSW regarding discharge and had chosen [facility] facility. [name] appeared pleased that her daughter was now able to go to dialysis in the chair.   Employment status:  Disabled (Comment on whether or not currently receiving Disability) Insurance information:  Medicaid [place] PT Recommendations:  Skilled Nursing Facility Information / Referral to community resources:  Skilled Nursing Facility  Patient/Family's Response to care:  No concerns expressed by  mother regarding care during hospitalization.  Patient/Family's Understanding of and Emotional Response to Diagnosis, Current Treatment, and Prognosis:  Mother appears to understand the severity of patient's illness and that her recuperation may be slow.  Emotional Assessment Appearance:  Appears stated age Attitude/Demeanor/Rapport:  Lethargic Affect (typically observed):  Quiet Orientation:  Oriented to Self, Oriented to Place Alcohol / Substance use:  Tobacco Use, Alcohol Use, Illicit Drugs (Patient reported that she has never smoked and does not drink or use illicit drugs) Psych involvement (Current and /or in the community):  No (Comment)  Discharge Needs  Concerns to be addressed:  Discharge Planning Concerns Readmission within the last 30 days:  Yes Current discharge risk:  None Barriers to Discharge:  Other (Assuring patient could sit in a wheelchair unrestrained)   Beth Dupre Lazaro Arms, LCSW 04/30/2017, 3:15 PM

## 2017-04-30 NOTE — Procedures (Signed)
Patient seen on Hemodialysis. QB 350, UF goal 1L Treatment adjusted as needed.  Zetta Bills MD [facility]. Office # [phone] Pager # [phone] 9:14 AM

## 2017-04-30 NOTE — Clinical Social Work Placement (Signed)
   CLINICAL SOCIAL WORK PLACEMENT  NOTE 04/30/17 - DISCHARGED TO [facility], TRANSPORTED BY AMBULANCE  Date:  04/30/2017  Patient Details  Name: [name] MRN: [id number] Date of Birth: 06-12-1981  Clinical Social Work is seeking post-discharge placement for this patient at the Skilled  Nursing Facility level of care (*CSW will initial, date and re-position this form in  chart as items are completed):  Yes   Patient/family provided with [facility] Clinical Social Work Department's list of facilities offering this level of care within the geographic area requested by the patient (or if unable, by the patient's family).  Yes   Patient/family informed of their freedom to choose among providers that offer the needed level of care, that participate in Medicare, Medicaid or managed care program needed by the patient, have an available bed and are willing to accept the patient.  Yes   Patient/family informed of [facility]'s ownership interest in [facility] and [facility], as well as of the fact that they are under no obligation to receive care at these facilities.  PASRR submitted to EDS on 02/27/17     PASRR number received on 02/27/17     Existing PASRR number confirmed on       FL2 transmitted to all facilities in geographic area requested by pt/family on 04/14/17     FL2 transmitted to all facilities within larger geographic area on       Patient informed that his/her managed care company has contracts with or will negotiate with certain facilities, including the following:        Yes   Patient/family informed of bed offers received.  Patient chooses bed at [facility]     Physician recommends and patient chooses bed at      Patient to be transferred to [facility] on 04/30/17.  Patient to be transferred to facility by Ambulance     Patient family notified on 04/30/17 of transfer.  Name of family member notified:  Mother, Ashleymarie Dinsmore     PHYSICIAN       Additional  Comment:    _______________________________________________ Cristobal Goldmann, LCSW 04/30/2017, 3:29 PM

## 2017-05-08 ENCOUNTER — Ambulatory Visit (INDEPENDENT_AMBULATORY_CARE_PROVIDER_SITE_OTHER): Payer: Medicaid Other | Admitting: Family

## 2017-05-08 ENCOUNTER — Encounter (INDEPENDENT_AMBULATORY_CARE_PROVIDER_SITE_OTHER): Payer: Self-pay | Admitting: Family

## 2017-05-08 VITALS — Ht 68.0 in | Wt 165.0 lb

## 2017-05-08 DIAGNOSIS — Z89612 Acquired absence of left leg above knee: Secondary | ICD-10-CM

## 2017-05-08 DIAGNOSIS — I70263 Atherosclerosis of native arteries of extremities with gangrene, bilateral legs: Secondary | ICD-10-CM | POA: Diagnosis not present

## 2017-05-08 DIAGNOSIS — L89610 Pressure ulcer of right heel, unstageable: Secondary | ICD-10-CM

## 2017-05-08 DIAGNOSIS — N186 End stage renal disease: Secondary | ICD-10-CM

## 2017-05-08 DIAGNOSIS — S78112A Complete traumatic amputation at level between left hip and knee, initial encounter: Secondary | ICD-10-CM | POA: Insufficient documentation

## 2017-05-08 MED ORDER — SILVER SULFADIAZINE 1 % EX CREA: 1.0000 "application " | TOPICAL_CREAM | Freq: Every day | CUTANEOUS | 0 refills | Status: DC

## 2017-05-08 NOTE — Progress Notes (Signed)
Post-Op Visit Note   Patient: [name]           Date of Birth: 1980-11-10           MRN: [id number] Visit Date: 05/08/2017 PCP: [name], FNP  Chief Complaint:  Chief Complaint  Patient presents with  . Left Leg - Routine Post Op    04/03/17 revision left AKA and wound vac    HPI:  The patient is a 36 year old woman who presents today status post left above-the-knee amputation on July 6. Incision is approximated with staples and sutures has been residing at [facility] for her rehabilitation.  has a heel ulcer on the right as well. A dry dressing is in place today. Family reports she has a moon boot that she's been using back at the facility does not have this with her today.    Ortho Exam On examination the left above-the-knee amputation this is well proximate sutures and staples is healing well there is no gaping no drainage no erythema does have massive swelling. The left lower extremity with moderate edema does have a 7 cm x 3 cm decubitus ulcer to the posterior heel this is surrounded by some maceration there is no drainage no cellulitis.  Visit Diagnoses:  1. 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 Decubitus ulcer of right heel, unstageable (HCC)   3. ESRD (end stage renal disease) (HCC)   4. Above knee amputation of left lower extremity (HCC)     Plan: Him begin daily Dial soap cleansing of the incision. Apply dry dressings until healed. Did provide a order for her prosthesis to [facility] today. Once the shrinkers obtained she is to wear this to the left below the knee amputation daily. For the right heel ulcer begin Silvadene dressing changes daily flow to heel. Provided a PRAFO boot to offload pressure from the heel. Elevate this as able. Follow-up in office in 2 weeks.  Follow-Up Instructions: Return in about 2 weeks (around 05/22/2017).   Imaging: No results found.  Orders:  No orders of the defined types were  placed in this encounter.  No orders of the defined types were placed in this encounter.    PMFS History: Patient Active Problem List   Diagnosis Date Noted  . Above knee amputation of left lower extremity (HCC) 05/08/2017  . Weakness generalized   . Aspiration into airway   . Fever   . Hypoxemia   . Tracheostomy status (HCC)   . Anoxic brain injury (HCC)   . Palliative care encounter   . Goals of care, counseling/discussion   . Tachycardia   . DNR (do not resuscitate) discussion 03/04/2017  . Palliative care by specialist 03/04/2017  . Protein calorie malnutrition (HCC)   . Acute on chronic respiratory failure with hypoxemia (HCC)   . Bacteremia   . Encounter for PEG (percutaneous endoscopic gastrostomy) (HCC)   . Elevated liver enzymes   . Jaundice   . Coma (HCC)   . Osteomyelitis (HCC) 02/14/2017  . Pressure injury of skin 02/14/2017  . Cardiac arrest, cause unspecified (HCC)   . Status post transmetatarsal amputation of right foot (HCC) 10/08/2016  . 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 Diabetic osteomyelitis (HCC)   . 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 ESRD (end stage renal disease) (HCC) 06/20/2016  . Diabetic foot ulcer (HCC) 06/20/2016  . Chronic pain syndrome 02/24/2016  .  Normal coronary arteries 10/12/2015  . NSTEMI- type 2, Troponin 11.2 05/11/2015  . Acute respiratory failure (HCC) 05/10/2015  . History of arthroscopy of right shoulder-05/10/15 05/10/2015  . CKD (chronic kidney disease) stage 5, GFR less than 15 ml/min (HCC) 09/21/2014  . Unspecified asthma(493.90) 10/25/2013  . Acute combined systolic and diastolic heart failure (HCC) 09/21/2013  . Non-ischemic cardiomyopathy - by echo 8/16- EF 35-40% 09/16/2013  . Morbid obesity-  09/16/2013  . 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) 09/15/2013  . Normocytic anemia 09/15/2013  . Lower extremity edema 09/15/2013    . Hyperlipidemia   . Hypertension    Past Medical History:  Diagnosis Date  . Anemia   . Arthritis    "left hand" (09/15/2013)  . Asthma   . CHF (congestive heart failure) (HCC)   . Chronic bronchitis (HCC)    "just about q yr" (09/15/2013)  . Chronic kidney disease    "low kidney function" (09/15/2013) ,  T/Th/Sa  . COPD (chronic obstructive pulmonary disease) (HCC)   . Coronary artery disease   . Hyperlipidemia   . Hypertension   . Migraine    "get them alot" (09/15/2013)  . Myocardial infarction [facility]) 04/2015   NSTEMI  . Normal coronary arteries    by cardiac catheterization 09/20/13  . Peripheral vascular disease (HCC)   . Pneumonia    "couple times; have it now" (09/15/2013)  . PONV (postoperative nausea and vomiting)   . Restless legs   . Shortness of breath    "just recently; related to the pneumonia" (09/15/2013)  . Type 1 diabetes mellitus (HCC)    type 2    Family History  Problem Relation Age of Onset  . Diabetes Mother   . Stroke Mother   . Hypertension Father   . Hyperlipidemia Father   . Cancer - Lung Father   . Diabetes Maternal Grandmother   . Cancer Paternal Grandmother     Past Surgical History:  Procedure Laterality Date  . AMPUTATION Right 06/25/2016   Procedure: Amputation Right Great Toe at the Metatarsophalangeal Joint;  Surgeon: Nadara Mustard, MD;  Location: [facility] OR;  Service: Orthopedics;  Laterality: Right;  . AMPUTATION Bilateral 10/08/2016   Procedure: Bilateral Transmetatarsal Amputation;  Surgeon: Nadara Mustard, MD;  Location: [facility] OR;  Service: Orthopedics;  Laterality: Bilateral;  . AMPUTATION Left 03/13/2017   Procedure: AMPUTATION ABOVE KNEE;  Surgeon: Nadara Mustard, MD;  Location: [facility] OR;  Service: Orthopedics;  Laterality: Left;  . APPLICATION OF WOUND VAC Left 04/03/2017   Procedure: Application of Wound Vacuum;  Surgeon: Nadara Mustard, MD;  Location: [facility] OR;  Service: Orthopedics;  Laterality: Left;  . AV FISTULA PLACEMENT Left  03/27/2015   Procedure: CREATION RADIAL CEPHALIC ARTERIOVENOUS FISTULA;  Surgeon: Chuck Hint, MD;  Location: [facility] OR;  Service: Vascular;  Laterality: Left;  . AV FISTULA PLACEMENT Left 11/23/2015   Procedure:  LEFT ARM BASILIC VEIN TRANSPOSITION  ;  Surgeon: Chuck Hint, MD;  Location: [facility] OR;  Service: Vascular;  Laterality: Left;  . CESAREAN SECTION  1999; 2006  . CORONARY ANGIOGRAM  09/20/2013   Procedure: CORONARY ANGIOGRAM;  Surgeon: Runell Gess, MD;  Location: [facility] CATH LAB;  Service: Cardiovascular;;  . FINGER SURGERY Left 1985   3rd and 4th digits reconstructed after cut off" (09/15/2013)  . IR FLUORO GUIDE CV LINE RIGHT  03/11/2017  . IR US GUIDE VASC ACCESS RIGHT  03/11/2017  . PERIPHERAL VASCULAR CATHETERIZATION N/A  09/05/2016   Procedure: Abdominal Aortogram;  Surgeon: Sherren Kerns, MD;  Location: [facility] INVASIVE CV LAB;  Service: Cardiovascular;  Laterality: N/A;  . PERIPHERAL VASCULAR CATHETERIZATION Bilateral 09/05/2016   Procedure: Lower Extremity Angiography;  Surgeon: Sherren Kerns, MD;  Location: [facility] INVASIVE CV LAB;  Service: Cardiovascular;  Laterality: Bilateral;  . PERIPHERAL VASCULAR CATHETERIZATION Right 09/05/2016   Procedure: Peripheral Vascular Balloon Angioplasty;  Surgeon: Sherren Kerns, MD;  Location: [facility] INVASIVE CV LAB;  Service: Cardiovascular;  Laterality: Right;  peroneal and AT  . SHOULDER ARTHROSCOPY WITH BICEPSTENOTOMY Right 05/10/2015   Procedure: RIGHT SHOULDER ARTHROSCOPY WITH BICEPS TENOTOMY, DEBRIDEMENT LABRAL TEAR;  Surgeon: Jones Broom, MD;  Location: [facility] OR;  Service: Orthopedics;  Laterality: Right;  Right shoulder arthroscopy biceps tenotomy, debridement labral tear  . STUMP REVISION Left 01/21/2017   Procedure: . Revision Left Transmetatarsal Amputation;  Surgeon: Nadara Mustard, MD;  Location: [facility] OR;  Service: Orthopedics;  Laterality: Left;  . STUMP REVISION Left 04/03/2017   Procedure: Revision Left Above Knee Amputation;   Surgeon: Nadara Mustard, MD;  Location: [facility] OR;  Service: Orthopedics;  Laterality: Left;  . TONSILLECTOMY  1997  . TUBAL LIGATION  2006   Social History   Occupational History  . Student    Social History Main Topics  . Smoking status: Never Smoker  . Smokeless tobacco: Never Used  . Alcohol use No  . Drug use: No  . Sexual activity: Not Currently    Partners: Male    Birth control/ protection: Other-see comments     Comment: S/P tubal ligation

## 2017-05-12 ENCOUNTER — Telehealth (INDEPENDENT_AMBULATORY_CARE_PROVIDER_SITE_OTHER): Payer: Self-pay | Admitting: Radiology

## 2017-05-12 NOTE — Telephone Encounter (Signed)
Amada Kingfisher calling from [facility]. She is OT. She advised [facility] is unable to go out to see patient. Advised they would like to use level 4 since another resident is already having services with level 4, and they are going to see them already. Advised ok to use Level 4 per Dr.Duda. If they new rx to let us know.

## 2017-05-21 NOTE — Addendum Note (Signed)
Addendum  created 05/21/17 1333 by Kipp Brood, MD   Sign clinical note

## 2017-05-22 ENCOUNTER — Encounter (INDEPENDENT_AMBULATORY_CARE_PROVIDER_SITE_OTHER): Payer: Self-pay | Admitting: Family

## 2017-05-22 ENCOUNTER — Ambulatory Visit (INDEPENDENT_AMBULATORY_CARE_PROVIDER_SITE_OTHER): Payer: Medicaid Other | Admitting: Family

## 2017-05-22 VITALS — Ht 68.0 in | Wt 165.0 lb

## 2017-05-22 DIAGNOSIS — S78112A Complete traumatic amputation at level between left hip and knee, initial encounter: Secondary | ICD-10-CM

## 2017-05-22 DIAGNOSIS — Z89612 Acquired absence of left leg above knee: Secondary | ICD-10-CM

## 2017-05-22 DIAGNOSIS — L89610 Pressure ulcer of right heel, unstageable: Secondary | ICD-10-CM

## 2017-05-22 NOTE — Progress Notes (Signed)
Post-Op Visit Note   Patient: [name]           Date of Birth: 01-May-1981           MRN: [id number] Visit Date: 05/22/2017 PCP: [name], FNP  Chief Complaint:  Chief Complaint  Patient presents with  . Left Leg - Routine Post Op    04/03/17 left AKA  . Right Foot - Wound Check    Heel ulcer    HPI:  The patient is a 36 year old woman who presents today status post left above-the-knee amputation on July 6. Has been residing at [place] for her rehabilitation.  has a heel ulcer on the right as well. A dry dressing is in place today. Prafo as well.    Wound Check     Ortho Exam On examination the left above-the-knee amputation this is well healed. no drainage no erythema. Limb is consolidating.  The left lower extremity with moderate edema. decubitus ulcer to the posterior heel completely covered with eschar. No drainage. No surrounding maceration. No odor. No erythema. Visit Diagnoses:  1. Above knee amputation of left lower extremity (HCC)   2. Decubitus ulcer of right heel, unstageable (HCC)     Plan: continue to wear the shrinker to the left above the knee amputation daily. Ready for next steps with prosthesis set up with Level 4.  For the right heel ulcer begin Silvadene dressing changes daily to heel. Continue PRAFO boot to offload pressure from the heel. Elevate this as able. Follow-up in office in 2 months.  Follow-Up Instructions: Return in about 2 months (around 07/22/2017).   Imaging: No results found.  Orders:  No orders of the defined types were placed in this encounter.  No orders of the defined types were placed in this encounter.    PMFS History: Patient Active Problem List   Diagnosis Date Noted  . Above knee amputation of left lower extremity (HCC) 05/08/2017  . Weakness generalized   . Aspiration into airway   . Fever   . Hypoxemia   . Tracheostomy status (HCC)   . Anoxic brain injury (HCC)   . Palliative care encounter   .  Goals of care, counseling/discussion   . Tachycardia   . DNR (do not resuscitate) discussion 03/04/2017  . Palliative care by specialist 03/04/2017  . Protein calorie malnutrition (HCC)   . Acute on chronic respiratory failure with hypoxemia (HCC)   . Bacteremia   . Encounter for PEG (percutaneous endoscopic gastrostomy) (HCC)   . Elevated liver enzymes   . Jaundice   . Coma (HCC)   . Osteomyelitis (HCC) 02/14/2017  . Pressure injury of skin 02/14/2017  . Cardiac arrest, cause unspecified (HCC)   . Status post transmetatarsal amputation of right foot (HCC) 10/08/2016  . 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 Diabetic osteomyelitis (HCC)   . 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 ESRD (end stage renal disease) (HCC) 06/20/2016  . Diabetic foot ulcer (HCC) 06/20/2016  . Chronic pain syndrome 02/24/2016  . Normal coronary arteries 10/12/2015  . NSTEMI- type 2, Troponin 11.2 05/11/2015  . Acute respiratory failure (HCC) 05/10/2015  . History of arthroscopy of right shoulder-05/10/15 05/10/2015  . CKD (chronic kidney disease) stage 5, GFR less than 15 ml/min (HCC) 09/21/2014  . Unspecified asthma(493.90) 10/25/2013  . Acute combined systolic and diastolic heart failure (HCC) 09/21/2013  . Non-ischemic cardiomyopathy - by echo 8/16- EF 35-40% 09/16/2013  .  Morbid obesity-  09/16/2013  . 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) 09/15/2013  . Normocytic anemia 09/15/2013  . Lower extremity edema 09/15/2013  . Hyperlipidemia   . Hypertension    Past Medical History:  Diagnosis Date  . Anemia   . Arthritis    "left hand" (09/15/2013)  . Asthma   . CHF (congestive heart failure) (HCC)   . Chronic bronchitis (HCC)    "just about q yr" (09/15/2013)  . Chronic kidney disease    "low kidney function" (09/15/2013) ,  T/Th/Sa  . COPD (chronic obstructive pulmonary disease) (HCC)   . Coronary artery  disease   . Hyperlipidemia   . Hypertension   . Migraine    "get them alot" (09/15/2013)  . Myocardial infarction [facility]) 04/2015   NSTEMI  . Normal coronary arteries    by cardiac catheterization 09/20/13  . Peripheral vascular disease (HCC)   . Pneumonia    "couple times; have it now" (09/15/2013)  . PONV (postoperative nausea and vomiting)   . Restless legs   . Shortness of breath    "just recently; related to the pneumonia" (09/15/2013)  . Type 1 diabetes mellitus (HCC)    type 2    Family History  Problem Relation Age of Onset  . Diabetes Mother   . Stroke Mother   . Hypertension Father   . Hyperlipidemia Father   . Cancer - Lung Father   . Diabetes Maternal Grandmother   . Cancer Paternal Grandmother     Past Surgical History:  Procedure Laterality Date  . AMPUTATION Right 06/25/2016   Procedure: Amputation Right Great Toe at the Metatarsophalangeal Joint;  Surgeon: Nadara Mustard, MD;  Location: [facility] OR;  Service: Orthopedics;  Laterality: Right;  . AMPUTATION Bilateral 10/08/2016   Procedure: Bilateral Transmetatarsal Amputation;  Surgeon: Nadara Mustard, MD;  Location: [facility] OR;  Service: Orthopedics;  Laterality: Bilateral;  . AMPUTATION Left 03/13/2017   Procedure: AMPUTATION ABOVE KNEE;  Surgeon: Nadara Mustard, MD;  Location: [facility] OR;  Service: Orthopedics;  Laterality: Left;  . APPLICATION OF WOUND VAC Left 04/03/2017   Procedure: Application of Wound Vacuum;  Surgeon: Nadara Mustard, MD;  Location: [facility] OR;  Service: Orthopedics;  Laterality: Left;  . AV FISTULA PLACEMENT Left 03/27/2015   Procedure: CREATION RADIAL CEPHALIC ARTERIOVENOUS FISTULA;  Surgeon: Chuck Hint, MD;  Location: [facility] OR;  Service: Vascular;  Laterality: Left;  . AV FISTULA PLACEMENT Left 11/23/2015   Procedure:  LEFT ARM BASILIC VEIN TRANSPOSITION  ;  Surgeon: Chuck Hint, MD;  Location: [facility] OR;  Service: Vascular;  Laterality: Left;  . CESAREAN SECTION  1999; 2006  . CORONARY ANGIOGRAM   09/20/2013   Procedure: CORONARY ANGIOGRAM;  Surgeon: Runell Gess, MD;  Location: [facility] CATH LAB;  Service: Cardiovascular;;  . FINGER SURGERY Left 1985   3rd and 4th digits reconstructed after cut off" (09/15/2013)  . IR FLUORO GUIDE CV LINE RIGHT  03/11/2017  . IR US GUIDE VASC ACCESS RIGHT  03/11/2017  . PERIPHERAL VASCULAR CATHETERIZATION N/A 09/05/2016   Procedure: Abdominal Aortogram;  Surgeon: Sherren Kerns, MD;  Location: [facility] INVASIVE CV LAB;  Service: Cardiovascular;  Laterality: N/A;  . PERIPHERAL VASCULAR CATHETERIZATION Bilateral 09/05/2016   Procedure: Lower Extremity Angiography;  Surgeon: Sherren Kerns, MD;  Location: [facility] INVASIVE CV LAB;  Service: Cardiovascular;  Laterality: Bilateral;  . PERIPHERAL VASCULAR CATHETERIZATION Right 09/05/2016   Procedure: Peripheral Vascular Balloon Angioplasty;  Surgeon: Janetta Hora  Fields, MD;  Location: [facility] INVASIVE CV LAB;  Service: Cardiovascular;  Laterality: Right;  peroneal and AT  . SHOULDER ARTHROSCOPY WITH BICEPSTENOTOMY Right 05/10/2015   Procedure: RIGHT SHOULDER ARTHROSCOPY WITH BICEPS TENOTOMY, DEBRIDEMENT LABRAL TEAR;  Surgeon: Jones Broom, MD;  Location: [facility] OR;  Service: Orthopedics;  Laterality: Right;  Right shoulder arthroscopy biceps tenotomy, debridement labral tear  . STUMP REVISION Left 01/21/2017   Procedure: . Revision Left Transmetatarsal Amputation;  Surgeon: Nadara Mustard, MD;  Location: [facility] OR;  Service: Orthopedics;  Laterality: Left;  . STUMP REVISION Left 04/03/2017   Procedure: Revision Left Above Knee Amputation;  Surgeon: Nadara Mustard, MD;  Location: [facility] OR;  Service: Orthopedics;  Laterality: Left;  . TONSILLECTOMY  1997  . TUBAL LIGATION  2006   Social History   Occupational History  . Student    Social History Main Topics  . Smoking status: Never Smoker  . Smokeless tobacco: Never Used  . Alcohol use No  . Drug use: No  . Sexual activity: Not Currently    Partners: Male    Birth control/ protection:  Other-see comments     Comment: S/P tubal ligation

## 2017-06-10 ENCOUNTER — Ambulatory Visit (INDEPENDENT_AMBULATORY_CARE_PROVIDER_SITE_OTHER): Payer: Medicaid Other | Admitting: Family

## 2017-06-10 DIAGNOSIS — Z89612 Acquired absence of left leg above knee: Secondary | ICD-10-CM

## 2017-06-10 DIAGNOSIS — S78112A Complete traumatic amputation at level between left hip and knee, initial encounter: Secondary | ICD-10-CM

## 2017-06-10 DIAGNOSIS — L03116 Cellulitis of left lower limb: Secondary | ICD-10-CM

## 2017-06-10 MED ORDER — MUPIROCIN 2 % EX OINT: 1.0000 "application " | TOPICAL_OINTMENT | Freq: Every day | CUTANEOUS | 6 refills | Status: DC

## 2017-06-10 NOTE — Progress Notes (Signed)
Post-Op Visit Note   Patient: [name]           Date of Birth: May 22, 1981           MRN: [id number] Visit Date: 06/10/2017 PCP: [name], FNP  Chief Complaint:  Chief Complaint  Patient presents with  . Left Leg - Routine Post Op    HPI:  The patient is a 36 year old woman who presents today status post left above-the-knee amputation on July 6 of this year. The patient is unable to provide a good history however she was sent from her facility for evaluation of her left AKA she's been having erythema drainage increased tenderness. Has been on doxycycline since September 7 without improvement.  No systemic symptoms.    Ortho Exam on examination her left above-the-knee amputation incision is well-healed however there are 2 draining sinuses some macerated tissue there is exudative tissue in wound bed there is moderate serous drainage. No odor. No palpable deep abscess. Does have local cellulitis.  Visit Diagnoses:  1. Above knee amputation of left lower extremity (HCC)   2. Cellulitis of left lower extremity     Plan: continue Doxycycline. Mupirocin dressing changes daily following wound cleansing. Will discuss revision of AKA with Dr. [name] and call patient and her mother to discuss plan.   Follow-Up Instructions: Return in about 1 week (around 06/17/2017).   Imaging: No results found.  Orders:  No orders of the defined types were placed in this encounter.  Meds ordered this encounter  Medications  . mupirocin ointment (BACTROBAN) 2 %    Sig: Apply 1 application topically daily.    Dispense:  22 g    Refill:  6     PMFS History: Patient Active Problem List   Diagnosis Date Noted  . Above knee amputation of left lower extremity (HCC) 05/08/2017  . Weakness generalized   . Aspiration into airway   . Fever   . Hypoxemia   . Tracheostomy status (HCC)   . Anoxic brain injury (HCC)   . Palliative care encounter   . Goals of care, counseling/discussion   .  Tachycardia   . DNR (do not resuscitate) discussion 03/04/2017  . Palliative care by specialist 03/04/2017  . Protein calorie malnutrition (HCC)   . Acute on chronic respiratory failure with hypoxemia (HCC)   . Bacteremia   . Encounter for PEG (percutaneous endoscopic gastrostomy) (HCC)   . Elevated liver enzymes   . Jaundice   . Coma (HCC)   . Osteomyelitis (HCC) 02/14/2017  . Pressure injury of skin 02/14/2017  . Cardiac arrest, cause unspecified (HCC)   . Status post transmetatarsal amputation of right foot (HCC) 10/08/2016  . 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 Diabetic osteomyelitis (HCC)   . 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 ESRD (end stage renal disease) (HCC) 06/20/2016  . Diabetic foot ulcer (HCC) 06/20/2016  . Chronic pain syndrome 02/24/2016  . Normal coronary arteries 10/12/2015  . NSTEMI- type 2, Troponin 11.2 05/11/2015  . Acute respiratory failure (HCC) 05/10/2015  . History of arthroscopy of right shoulder-05/10/15 05/10/2015  . CKD (chronic kidney disease) stage 5, GFR less than 15 ml/min (HCC) 09/21/2014  . Unspecified asthma(493.90) 10/25/2013  . Acute combined systolic and diastolic heart failure (HCC) 09/21/2013  . Non-ischemic cardiomyopathy - by echo 8/16- EF 35-40% 09/16/2013  . Morbid obesity-  09/16/2013  . 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)  09/15/2013  . Normocytic anemia 09/15/2013  . Lower extremity edema 09/15/2013  . Hyperlipidemia   . Hypertension    Past Medical History:  Diagnosis Date  . Anemia   . Arthritis    "left hand" (09/15/2013)  . Asthma   . CHF (congestive heart failure) (HCC)   . Chronic bronchitis (HCC)    "just about q yr" (09/15/2013)  . Chronic kidney disease    "low kidney function" (09/15/2013) ,  T/Th/Sa  . COPD (chronic obstructive pulmonary disease) (HCC)   . Coronary artery disease   . Hyperlipidemia   . Hypertension    . Migraine    "get them alot" (09/15/2013)  . Myocardial infarction [facility]) 04/2015   NSTEMI  . Normal coronary arteries    by cardiac catheterization 09/20/13  . Peripheral vascular disease (HCC)   . Pneumonia    "couple times; have it now" (09/15/2013)  . PONV (postoperative nausea and vomiting)   . Restless legs   . Shortness of breath    "just recently; related to the pneumonia" (09/15/2013)  . Type 1 diabetes mellitus (HCC)    type 2    Family History  Problem Relation Age of Onset  . Diabetes Mother   . Stroke Mother   . Hypertension Father   . Hyperlipidemia Father   . Cancer - Lung Father   . Diabetes Maternal Grandmother   . Cancer Paternal Grandmother     Past Surgical History:  Procedure Laterality Date  . AMPUTATION Right 06/25/2016   Procedure: Amputation Right Great Toe at the Metatarsophalangeal Joint;  Surgeon: Nadara Mustard, MD;  Location: [facility] OR;  Service: Orthopedics;  Laterality: Right;  . AMPUTATION Bilateral 10/08/2016   Procedure: Bilateral Transmetatarsal Amputation;  Surgeon: Nadara Mustard, MD;  Location: [facility] OR;  Service: Orthopedics;  Laterality: Bilateral;  . AMPUTATION Left 03/13/2017   Procedure: AMPUTATION ABOVE KNEE;  Surgeon: Nadara Mustard, MD;  Location: [facility] OR;  Service: Orthopedics;  Laterality: Left;  . APPLICATION OF WOUND VAC Left 04/03/2017   Procedure: Application of Wound Vacuum;  Surgeon: Nadara Mustard, MD;  Location: [facility] OR;  Service: Orthopedics;  Laterality: Left;  . AV FISTULA PLACEMENT Left 03/27/2015   Procedure: CREATION RADIAL CEPHALIC ARTERIOVENOUS FISTULA;  Surgeon: Chuck Hint, MD;  Location: [facility] OR;  Service: Vascular;  Laterality: Left;  . AV FISTULA PLACEMENT Left 11/23/2015   Procedure:  LEFT ARM BASILIC VEIN TRANSPOSITION  ;  Surgeon: Chuck Hint, MD;  Location: [facility] OR;  Service: Vascular;  Laterality: Left;  . CESAREAN SECTION  1999; 2006  . CORONARY ANGIOGRAM  09/20/2013   Procedure: CORONARY ANGIOGRAM;   Surgeon: Runell Gess, MD;  Location: [facility] CATH LAB;  Service: Cardiovascular;;  . FINGER SURGERY Left 1985   3rd and 4th digits reconstructed after cut off" (09/15/2013)  . IR FLUORO GUIDE CV LINE RIGHT  03/11/2017  . IR US GUIDE VASC ACCESS RIGHT  03/11/2017  . PERIPHERAL VASCULAR CATHETERIZATION N/A 09/05/2016   Procedure: Abdominal Aortogram;  Surgeon: Sherren Kerns, MD;  Location: [facility] INVASIVE CV LAB;  Service: Cardiovascular;  Laterality: N/A;  . PERIPHERAL VASCULAR CATHETERIZATION Bilateral 09/05/2016   Procedure: Lower Extremity Angiography;  Surgeon: Sherren Kerns, MD;  Location: [facility] INVASIVE CV LAB;  Service: Cardiovascular;  Laterality: Bilateral;  . PERIPHERAL VASCULAR CATHETERIZATION Right 09/05/2016   Procedure: Peripheral Vascular Balloon Angioplasty;  Surgeon: Sherren Kerns, MD;  Location: [facility] INVASIVE CV LAB;  Service: Cardiovascular;  Laterality: Right;  peroneal and AT  . SHOULDER ARTHROSCOPY WITH BICEPSTENOTOMY Right 05/10/2015   Procedure: RIGHT SHOULDER ARTHROSCOPY WITH BICEPS TENOTOMY, DEBRIDEMENT LABRAL TEAR;  Surgeon: Jones Broom, MD;  Location: [facility] OR;  Service: Orthopedics;  Laterality: Right;  Right shoulder arthroscopy biceps tenotomy, debridement labral tear  . STUMP REVISION Left 01/21/2017   Procedure: . Revision Left Transmetatarsal Amputation;  Surgeon: Nadara Mustard, MD;  Location: [facility] OR;  Service: Orthopedics;  Laterality: Left;  . STUMP REVISION Left 04/03/2017   Procedure: Revision Left Above Knee Amputation;  Surgeon: Nadara Mustard, MD;  Location: [facility] OR;  Service: Orthopedics;  Laterality: Left;  . TONSILLECTOMY  1997  . TUBAL LIGATION  2006   Social History   Occupational History  . Student    Social History Main Topics  . Smoking status: Never Smoker  . Smokeless tobacco: Never Used  . Alcohol use No  . Drug use: No  . Sexual activity: Not Currently    Partners: Male    Birth control/ protection: Other-see comments     Comment: S/P tubal  ligation

## 2017-06-11 ENCOUNTER — Ambulatory Visit (INDEPENDENT_AMBULATORY_CARE_PROVIDER_SITE_OTHER): Payer: Self-pay | Admitting: Orthopedic Surgery

## 2017-06-17 ENCOUNTER — Encounter (INDEPENDENT_AMBULATORY_CARE_PROVIDER_SITE_OTHER): Payer: Self-pay | Admitting: Family

## 2017-06-17 ENCOUNTER — Ambulatory Visit (INDEPENDENT_AMBULATORY_CARE_PROVIDER_SITE_OTHER): Payer: Medicaid Other | Admitting: Family

## 2017-06-17 ENCOUNTER — Other Ambulatory Visit (INDEPENDENT_AMBULATORY_CARE_PROVIDER_SITE_OTHER): Payer: Self-pay | Admitting: Family

## 2017-06-17 DIAGNOSIS — L89610 Pressure ulcer of right heel, unstageable: Secondary | ICD-10-CM

## 2017-06-17 DIAGNOSIS — S78112A Complete traumatic amputation at level between left hip and knee, initial encounter: Secondary | ICD-10-CM

## 2017-06-17 DIAGNOSIS — Z89612 Acquired absence of left leg above knee: Secondary | ICD-10-CM

## 2017-06-17 DIAGNOSIS — S78112S Complete traumatic amputation at level between left hip and knee, sequela: Secondary | ICD-10-CM

## 2017-06-17 MED ORDER — DOXYCYCLINE HYCLATE 100 MG PO TABS: 100.0000 mg | ORAL_TABLET | Freq: Two times a day (BID) | ORAL | 0 refills | Status: AC

## 2017-06-17 NOTE — Progress Notes (Addendum)
Post-Op Visit Note   Patient: [name]           Date of Birth: 1981-08-03           MRN: [id number] Visit Date: 06/17/2017 PCP: [name], FNP  Chief Complaint:  Chief Complaint  Patient presents with  . Left Leg - Routine Post Op  . Right Foot - Follow-up    HPI:  The patient is a 36 year old woman who presents today status post left above-the-knee amputation on July 6 of this year. The patient is unable to provide a good history due to history of anoxic brain injury.   Is seen in follow up for evaluation of infection with continued drainage to left AKA. she's been having erythema drainage increased tenderness. Review of medical records sent with patient, appears to be on no oral antibiotics, has completed a Doxycycline course.   No systemic symptoms.  WBC on 06/15/17 9.2  Ortho Exam on examination her left above-the-knee amputation has 3 ulcerative areas centrally with maceration and drainage. there is exudative tissue in wound bed. there is clear yellow drainage. No odor. No palpable deep abscess. Does have local cellulitis.  Right heel with foul odor. Decubitus ulcer with surrounding erythema and maceration. No drainage. Exquisite tenderness.  Visit Diagnoses:  No diagnosis found.  Plan: Resume Doxycycline. Mupirocin dressing changes daily following wound cleansing to left AKA. Will plan for revision surgery of Left AKA this Friday. Have attempted to call mother yvonne, left voicemail. Will plan for stat MRI of right heel, concern for osteo, deep infection. Concerned may need further limb salvage surgery on right. Continue with silvadene dressing changes on right with PRAFO.   Follow-Up Instructions: No Follow-up on file.   Imaging: No results found.     Orders:  No orders of the defined types were placed in this encounter.  No orders of the defined types were placed in this encounter.    PMFS History: Patient Active Problem List   Diagnosis Date  Noted  . Above knee amputation of left lower extremity (HCC) 05/08/2017  . Weakness generalized   . Aspiration into airway   . Fever   . Hypoxemia   . Tracheostomy status (HCC)   . Anoxic brain injury (HCC)   . Palliative care encounter   . Goals of care, counseling/discussion   . Tachycardia   . DNR (do not resuscitate) discussion 03/04/2017  . Palliative care by specialist 03/04/2017  . Protein calorie malnutrition (HCC)   . Acute on chronic respiratory failure with hypoxemia (HCC)   . Bacteremia   . Encounter for PEG (percutaneous endoscopic gastrostomy) (HCC)   . Elevated liver enzymes   . Jaundice   . Coma (HCC)   . Osteomyelitis (HCC) 02/14/2017  . Pressure injury of skin 02/14/2017  . Cardiac arrest, cause unspecified (HCC)   . Status post transmetatarsal amputation of right foot (HCC) 10/08/2016  . 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 Diabetic osteomyelitis (HCC)   . 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 ESRD (end stage renal disease) (HCC) 06/20/2016  . Diabetic foot ulcer (HCC) 06/20/2016  . Chronic pain syndrome 02/24/2016  . Normal coronary arteries 10/12/2015  . NSTEMI- type 2, Troponin 11.2 05/11/2015  . Acute respiratory failure (HCC) 05/10/2015  . History of arthroscopy of right shoulder-05/10/15 05/10/2015  . CKD (chronic kidney disease) stage 5, GFR less than 15 ml/min (HCC) 09/21/2014  . Unspecified asthma(493.90) 10/25/2013  .  Acute combined systolic and diastolic heart failure (HCC) 09/21/2013  . Non-ischemic cardiomyopathy - by echo 8/16- EF 35-40% 09/16/2013  . Morbid obesity-  09/16/2013  . 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) 09/15/2013  . Normocytic anemia 09/15/2013  . Lower extremity edema 09/15/2013  . Hyperlipidemia   . Hypertension    Past Medical History:  Diagnosis Date  . Anemia   . Arthritis    "left hand" (09/15/2013)  . Asthma   . CHF  (congestive heart failure) (HCC)   . Chronic bronchitis (HCC)    "just about q yr" (09/15/2013)  . Chronic kidney disease    "low kidney function" (09/15/2013) ,  T/Th/Sa  . COPD (chronic obstructive pulmonary disease) (HCC)   . Coronary artery disease   . Hyperlipidemia   . Hypertension   . Migraine    "get them alot" (09/15/2013)  . Myocardial infarction [facility]) 04/2015   NSTEMI  . Normal coronary arteries    by cardiac catheterization 09/20/13  . Peripheral vascular disease (HCC)   . Pneumonia    "couple times; have it now" (09/15/2013)  . PONV (postoperative nausea and vomiting)   . Restless legs   . Shortness of breath    "just recently; related to the pneumonia" (09/15/2013)  . Type 1 diabetes mellitus (HCC)    type 2    Family History  Problem Relation Age of Onset  . Diabetes Mother   . Stroke Mother   . Hypertension Father   . Hyperlipidemia Father   . Cancer - Lung Father   . Diabetes Maternal Grandmother   . Cancer Paternal Grandmother     Past Surgical History:  Procedure Laterality Date  . AMPUTATION Right 06/25/2016   Procedure: Amputation Right Great Toe at the Metatarsophalangeal Joint;  Surgeon: Nadara Mustard, MD;  Location[facility] OR;  Service: Orthopedics;  Laterality: Right;  . AMPUTATION Bilateral 10/08/2016   Procedure: Bilateral Transmetatarsal Amputation;  Surgeon: Nadara Mustard, MD;  Location: [facility] OR;  Service: Orthopedics;  Laterality: Bilateral;  . AMPUTATION Left 03/13/2017   Procedure: AMPUTATION ABOVE KNEE;  Surgeon: Nadara Mustard, MD;  Location: [facility] OR;  Service: Orthopedics;  Laterality: Left;  . APPLICATION OF WOUND VAC Left 04/03/2017   Procedure: Application of Wound Vacuum;  Surgeon: Nadara Mustard, MD;  Location: [facility] OR;  Service: Orthopedics;  Laterality: Left;  . AV FISTULA PLACEMENT Left 03/27/2015   Procedure: CREATION RADIAL CEPHALIC ARTERIOVENOUS FISTULA;  Surgeon: Chuck Hint, MD;  Location: [facility] OR;  Service: Vascular;  Laterality:  Left;  . AV FISTULA PLACEMENT Left 11/23/2015   Procedure:  LEFT ARM BASILIC VEIN TRANSPOSITION  ;  Surgeon: Chuck Hint, MD;  Location: [facility] OR;  Service: Vascular;  Laterality: Left;  . CESAREAN SECTION  1999; 2006  . CORONARY ANGIOGRAM  09/20/2013   Procedure: CORONARY ANGIOGRAM;  Surgeon: Runell Gess, MD;  Location: [facility] CATH LAB;  Service: Cardiovascular;;  . FINGER SURGERY Left 1985   3rd and 4th digits reconstructed after cut off" (09/15/2013)  . IR FLUORO GUIDE CV LINE RIGHT  03/11/2017  . IR US GUIDE VASC ACCESS RIGHT  03/11/2017  . PERIPHERAL VASCULAR CATHETERIZATION N/A 09/05/2016   Procedure: Abdominal Aortogram;  Surgeon: Sherren Kerns, MD;  Location: [facility] INVASIVE CV LAB;  Service: Cardiovascular;  Laterality: N/A;  . PERIPHERAL VASCULAR CATHETERIZATION Bilateral 09/05/2016   Procedure: Lower Extremity Angiography;  Surgeon: Sherren Kerns, MD;  Location: [facility] INVASIVE CV LAB;  Service:  Cardiovascular;  Laterality: Bilateral;  . PERIPHERAL VASCULAR CATHETERIZATION Right 09/05/2016   Procedure: Peripheral Vascular Balloon Angioplasty;  Surgeon: Sherren Kerns, MD;  Location: [facility] INVASIVE CV LAB;  Service: Cardiovascular;  Laterality: Right;  peroneal and AT  . SHOULDER ARTHROSCOPY WITH BICEPSTENOTOMY Right 05/10/2015   Procedure: RIGHT SHOULDER ARTHROSCOPY WITH BICEPS TENOTOMY, DEBRIDEMENT LABRAL TEAR;  Surgeon: Jones Broom, MD;  Location: [facility] OR;  Service: Orthopedics;  Laterality: Right;  Right shoulder arthroscopy biceps tenotomy, debridement labral tear  . STUMP REVISION Left 01/21/2017   Procedure: . Revision Left Transmetatarsal Amputation;  Surgeon: Nadara Mustard, MD;  Location: [facility] OR;  Service: Orthopedics;  Laterality: Left;  . STUMP REVISION Left 04/03/2017   Procedure: Revision Left Above Knee Amputation;  Surgeon: Nadara Mustard, MD;  Location: [facility] OR;  Service: Orthopedics;  Laterality: Left;  . TONSILLECTOMY  1997  . TUBAL LIGATION  2006   Social History    Occupational History  . Student    Social History Main Topics  . Smoking status: Never Smoker  . Smokeless tobacco: Never Used  . Alcohol use No  . Drug use: No  . Sexual activity: Not Currently    Partners: Male    Birth control/ protection: Other-see comments     Comment: S/P tubal ligation

## 2017-06-18 ENCOUNTER — Other Ambulatory Visit (INDEPENDENT_AMBULATORY_CARE_PROVIDER_SITE_OTHER): Payer: Self-pay | Admitting: Family

## 2017-06-18 ENCOUNTER — Ambulatory Visit (HOSPITAL_COMMUNITY): Admission: RE | Admit: 2017-06-18 | Payer: Medicaid Other | Source: Ambulatory Visit

## 2017-06-18 NOTE — Pre-Procedure Instructions (Signed)
    [name]  06/18/2017    Your procedure is scheduled on Friday, September 21.  Report to [facility] Admitting at 9:30 AM   Call this number if you have problems the morning of surgery: [phone] - pre- op desk   Remember:  Do not eat food or drink liquids after midnight.  Take these medicines the morning of surgery with A SIP OF WATER : carvedilol (COREG)                      DULoxetine (CYMBALTA Norco  Check CBG in AM and every 2 hours until she is transported to the hospital. If CBG is greater than 70 give  1/2 of scheduled dose of Lantus. If CBG > 220 give 1/2 of Sliding scale dose  If CBG < 70 do not give Insulin and treat with 4 glucose tablets or 1 tube glucose gel. Recheck CBG 15 minutes later, call the pre op desk for further institutions.  Give Albuterol inhaler or nebulizer treatments if needed.  Please send current Medication Record with administrated medications documented.   Per orders Dr Kipp Brood

## 2017-06-18 NOTE — Progress Notes (Signed)
Ms Schimming is alert, not always appropriate.  Patient's mother will be at the hospital the day of surgery to sign consent

## 2017-06-18 NOTE — Pre-Procedure Instructions (Signed)
    [name]  06/18/2017    Your procedure is scheduled on Friday, September 21.  Report to [facility] Admitting at 9:30 AM   Call this number if you have problems the morning of surgery: [phone] - pre- op desk   Remember:  Do not eat food or drink liquids after midnight.  Take these medicines the morning of surgery with A SIP OF WATER : carvedilol (COREG)                      DULoxetine (CYMBALTA Norco  Check CBG in AM and every 2 hours until she is transported to the hospital. If CBG is greater than 70 give  1/2 of scheduled dose of Lantus. If CBG > 220 give 1/2 of Sliding scale dose  If CBG < 70 do not give Insulin and treat with 4 glucose tablets or 1 tube glucose gel. Recheck CBG 15 minutes later, call the pre op desk for further institutions.  Please send current Medication Record with administrated medications documented.

## 2017-06-19 ENCOUNTER — Ambulatory Visit (HOSPITAL_COMMUNITY): Payer: Medicaid Other | Admitting: Anesthesiology

## 2017-06-19 ENCOUNTER — Encounter (HOSPITAL_COMMUNITY)
Admission: RE | Disposition: A | Discharge status: Skilled Nursing Facility | Payer: Self-pay | Source: Ambulatory Visit | Attending: Orthopedic Surgery

## 2017-06-19 ENCOUNTER — Ambulatory Visit (HOSPITAL_COMMUNITY)
Admission: RE | Admit: 2017-06-19 | Discharge: 2017-06-19 | Disposition: A | Discharge status: Skilled Nursing Facility | Payer: Medicaid Other | Source: Ambulatory Visit | Attending: Orthopedic Surgery | Admitting: Orthopedic Surgery

## 2017-06-19 DIAGNOSIS — Y835 Amputation of limb(s) as the cause of abnormal reaction of the patient, or of later complication, without mention of misadventure at the time of the procedure: Secondary | ICD-10-CM | POA: Diagnosis not present

## 2017-06-19 DIAGNOSIS — E1151 Type 2 diabetes mellitus with diabetic peripheral angiopathy without gangrene: Secondary | ICD-10-CM | POA: Insufficient documentation

## 2017-06-19 DIAGNOSIS — Z794 Long term (current) use of insulin: Secondary | ICD-10-CM | POA: Insufficient documentation

## 2017-06-19 DIAGNOSIS — I2510 Atherosclerotic heart disease of native coronary artery without angina pectoris: Secondary | ICD-10-CM | POA: Diagnosis not present

## 2017-06-19 DIAGNOSIS — Z89431 Acquired absence of right foot: Secondary | ICD-10-CM | POA: Diagnosis not present

## 2017-06-19 DIAGNOSIS — Z930 Tracheostomy status: Secondary | ICD-10-CM

## 2017-06-19 DIAGNOSIS — I129 Hypertensive chronic kidney disease with stage 1 through stage 4 chronic kidney disease, or unspecified chronic kidney disease: Secondary | ICD-10-CM | POA: Insufficient documentation

## 2017-06-19 DIAGNOSIS — T8781 Dehiscence of amputation stump: Secondary | ICD-10-CM

## 2017-06-19 DIAGNOSIS — E1122 Type 2 diabetes mellitus with diabetic chronic kidney disease: Secondary | ICD-10-CM | POA: Diagnosis not present

## 2017-06-19 DIAGNOSIS — Z89612 Acquired absence of left leg above knee: Secondary | ICD-10-CM | POA: Insufficient documentation

## 2017-06-19 DIAGNOSIS — N189 Chronic kidney disease, unspecified: Secondary | ICD-10-CM | POA: Insufficient documentation

## 2017-06-19 DIAGNOSIS — T8744 Infection of amputation stump, left lower extremity: Secondary | ICD-10-CM | POA: Insufficient documentation

## 2017-06-19 DIAGNOSIS — E46 Unspecified protein-calorie malnutrition: Secondary | ICD-10-CM

## 2017-06-19 DIAGNOSIS — Z431 Encounter for attention to gastrostomy: Secondary | ICD-10-CM

## 2017-06-19 DIAGNOSIS — I252 Old myocardial infarction: Secondary | ICD-10-CM | POA: Diagnosis not present

## 2017-06-19 DIAGNOSIS — L89619 Pressure ulcer of right heel, unspecified stage: Secondary | ICD-10-CM | POA: Insufficient documentation

## 2017-06-19 DIAGNOSIS — J449 Chronic obstructive pulmonary disease, unspecified: Secondary | ICD-10-CM | POA: Diagnosis not present

## 2017-06-19 DIAGNOSIS — I428 Other cardiomyopathies: Secondary | ICD-10-CM

## 2017-06-19 DIAGNOSIS — M869 Osteomyelitis, unspecified: Secondary | ICD-10-CM

## 2017-06-19 DIAGNOSIS — I96 Gangrene, not elsewhere classified: Secondary | ICD-10-CM | POA: Diagnosis present

## 2017-06-19 HISTORY — PX: STUMP REVISION: SHX6102

## 2017-06-19 HISTORY — PX: APPLICATION OF WOUND VAC: SHX5189

## 2017-06-19 LAB — HEMOGLOBIN A1C
HEMOGLOBIN A1C: 4.9 % (ref 4.8–5.6)
Mean Plasma Glucose: 93.93 mg/dL

## 2017-06-19 LAB — POCT I-STAT 4, (NA,K, GLUC, HGB,HCT)
Glucose, Bld: 108 mg/dL — ABNORMAL HIGH (ref 65–99)
HCT: 30 % — ABNORMAL LOW (ref 36.0–46.0)
HEMOGLOBIN: 10.2 g/dL — AB (ref 12.0–15.0)
POTASSIUM: 3.2 mmol/L — AB (ref 3.5–5.1)
Sodium: 137 mmol/L (ref 135–145)

## 2017-06-19 LAB — GLUCOSE, CAPILLARY
GLUCOSE-CAPILLARY: 108 mg/dL — AB (ref 65–99)
Glucose-Capillary: 101 mg/dL — ABNORMAL HIGH (ref 65–99)

## 2017-06-19 LAB — HCG, SERUM, QUALITATIVE: Preg, Serum: NEGATIVE

## 2017-06-19 SURGERY — REVISION, AMPUTATION SITE
Anesthesia: General | Site: Thigh | Laterality: Left

## 2017-06-19 MED ORDER — ONDANSETRON HCL 4 MG/2ML IJ SOLN
INTRAMUSCULAR | Status: DC | PRN
Start: 2017-06-19 — End: 2017-06-19
  Administered 2017-06-19: 4 mg via INTRAVENOUS

## 2017-06-19 MED ORDER — GLYCOPYRROLATE 0.2 MG/ML IJ SOLN
INTRAMUSCULAR | Status: DC | PRN
  Administered 2017-06-19: 0.2 mg via INTRAVENOUS

## 2017-06-19 MED ORDER — CARVEDILOL 3.125 MG PO TABS
6.2500 mg | ORAL_TABLET | Freq: Once | ORAL | Status: AC
  Administered 2017-06-19: 6.25 mg via ORAL
  Filled 2017-06-19: qty 2

## 2017-06-19 MED ORDER — FENTANYL CITRATE (PF) 250 MCG/5ML IJ SOLN
INTRAMUSCULAR | Status: AC
  Filled 2017-06-19: qty 5

## 2017-06-19 MED ORDER — ONDANSETRON HCL 4 MG/2ML IJ SOLN
INTRAMUSCULAR | Status: AC
  Filled 2017-06-19: qty 2

## 2017-06-19 MED ORDER — ARTIFICIAL TEARS OPHTHALMIC OINT
TOPICAL_OINTMENT | OPHTHALMIC | Status: DC | PRN
  Administered 2017-06-19: 1 via OPHTHALMIC

## 2017-06-19 MED ORDER — HYDROMORPHONE HCL 1 MG/ML IJ SOLN
INTRAMUSCULAR | Status: AC
  Administered 2017-06-19: 0.5 mg via INTRAVENOUS
  Filled 2017-06-19: qty 1

## 2017-06-19 MED ORDER — SODIUM CHLORIDE 0.9 % IV SOLN
INTRAVENOUS | Status: DC
  Administered 2017-06-19: 11:00:00 via INTRAVENOUS

## 2017-06-19 MED ORDER — CEFAZOLIN SODIUM-DEXTROSE 2-4 GM/100ML-% IV SOLN
INTRAVENOUS | Status: AC
  Filled 2017-06-19: qty 100

## 2017-06-19 MED ORDER — 0.9 % SODIUM CHLORIDE (POUR BTL) OPTIME
TOPICAL | Status: DC | PRN
  Administered 2017-06-19: 1000 mL

## 2017-06-19 MED ORDER — FENTANYL CITRATE (PF) 250 MCG/5ML IJ SOLN
INTRAMUSCULAR | Status: DC | PRN
Start: 2017-06-19 — End: 2017-06-19
  Administered 2017-06-19: 50 ug via INTRAVENOUS

## 2017-06-19 MED ORDER — PROPOFOL 10 MG/ML IV BOLUS
INTRAVENOUS | Status: AC
  Filled 2017-06-19: qty 20

## 2017-06-19 MED ORDER — LIDOCAINE 2% (20 MG/ML) 5 ML SYRINGE
INTRAMUSCULAR | Status: AC
  Filled 2017-06-19: qty 5

## 2017-06-19 MED ORDER — LIDOCAINE HCL (CARDIAC) 20 MG/ML IV SOLN
INTRAVENOUS | Status: DC | PRN
  Administered 2017-06-19: 60 mg via INTRATRACHEAL

## 2017-06-19 MED ORDER — PROPOFOL 10 MG/ML IV BOLUS
INTRAVENOUS | Status: DC | PRN
  Administered 2017-06-19: 100 mg via INTRAVENOUS

## 2017-06-19 MED ORDER — CHLORHEXIDINE GLUCONATE 4 % EX LIQD: 60.0000 mL | Freq: Once | CUTANEOUS | Status: DC

## 2017-06-19 MED ORDER — ALBUTEROL SULFATE HFA 108 (90 BASE) MCG/ACT IN AERS
1.0000 | INHALATION_SPRAY | Freq: Four times a day (QID) | RESPIRATORY_TRACT | Status: DC | PRN
  Administered 2017-06-19: 2 via RESPIRATORY_TRACT
  Filled 2017-06-19: qty 6.7

## 2017-06-19 MED ORDER — HYDROMORPHONE HCL 1 MG/ML IJ SOLN
0.2500 mg | INTRAMUSCULAR | Status: DC | PRN
  Administered 2017-06-19: 0.5 mg via INTRAVENOUS
  Administered 2017-06-19: 0.25 mg via INTRAVENOUS
  Administered 2017-06-19: 25 mg via INTRAVENOUS

## 2017-06-19 MED ORDER — CEFAZOLIN SODIUM-DEXTROSE 2-4 GM/100ML-% IV SOLN
2.0000 g | INTRAVENOUS | Status: AC
  Administered 2017-06-19: 2 g via INTRAVENOUS

## 2017-06-19 SURGICAL SUPPLY — 33 items
BENZOIN TINCTURE PRP APPL 2/3 (GAUZE/BANDAGES/DRESSINGS) ×4 IMPLANT
BLADE SAW RECIP 87.9 MT (BLADE) IMPLANT
BLADE SURG 21 STRL SS (BLADE) ×4 IMPLANT
CANISTER PREVENA PLUS 150 (CANNISTER) ×32 IMPLANT
COVER SURGICAL LIGHT HANDLE (MISCELLANEOUS) ×4 IMPLANT
DRAPE EXTREMITY T 121X128X90 (DRAPE) ×4 IMPLANT
DRAPE HALF SHEET 40X57 (DRAPES) ×4 IMPLANT
DRAPE INCISE IOBAN 66X45 STRL (DRAPES) ×4 IMPLANT
DRAPE U-SHAPE 47X51 STRL (DRAPES) ×8 IMPLANT
DRESSING PREVENA PLUS CUSTOM (GAUZE/BANDAGES/DRESSINGS) ×2 IMPLANT
DRSG PREVENA PLUS CUSTOM (GAUZE/BANDAGES/DRESSINGS) ×4
DRSG VAC ATS MED SENSATRAC (GAUZE/BANDAGES/DRESSINGS) ×4 IMPLANT
DURAPREP 26ML APPLICATOR (WOUND CARE) ×4 IMPLANT
ELECT REM PT RETURN 9FT ADLT (ELECTROSURGICAL) ×4
ELECTRODE REM PT RTRN 9FT ADLT (ELECTROSURGICAL) ×2 IMPLANT
GLOVE BIOGEL PI IND STRL 9 (GLOVE) ×2 IMPLANT
GLOVE BIOGEL PI INDICATOR 9 (GLOVE) ×2
GLOVE SURG ORTHO 9.0 STRL STRW (GLOVE) ×4 IMPLANT
GOWN STRL REUS W/ TWL XL LVL3 (GOWN DISPOSABLE) ×4 IMPLANT
GOWN STRL REUS W/TWL XL LVL3 (GOWN DISPOSABLE) ×4
KIT BASIN OR (CUSTOM PROCEDURE TRAY) ×4 IMPLANT
KIT DRSG PREVENA PLUS 7DAY 125 (MISCELLANEOUS) ×4 IMPLANT
KIT ROOM TURNOVER OR (KITS) ×4 IMPLANT
MANIFOLD NEPTUNE II (INSTRUMENTS) ×4 IMPLANT
NS IRRIG 1000ML POUR BTL (IV SOLUTION) ×4 IMPLANT
PACK GENERAL/GYN (CUSTOM PROCEDURE TRAY) ×4 IMPLANT
PAD ARMBOARD 7.5X6 YLW CONV (MISCELLANEOUS) ×4 IMPLANT
PAD NEG PRESSURE SENSATRAC (MISCELLANEOUS) IMPLANT
STAPLER VISISTAT 35W (STAPLE) IMPLANT
SUT ETHILON 2 0 PSLX (SUTURE) ×16 IMPLANT
SUT SILK 2 0 (SUTURE)
SUT SILK 2-0 18XBRD TIE 12 (SUTURE) IMPLANT
TOWEL OR 17X26 10 PK STRL BLUE (TOWEL DISPOSABLE) ×4 IMPLANT

## 2017-06-19 NOTE — Anesthesia Procedure Notes (Signed)
Procedure Name: LMA Insertion Date/Time: 06/19/2017 12:03 PM Performed by: Gaynelle Adu Pre-anesthesia Checklist: Patient identified, Emergency Drugs available, Suction available and Patient being monitored Patient Re-evaluated:Patient Re-evaluated prior to induction Oxygen Delivery Method: Circle system utilized Preoxygenation: Pre-oxygenation with 100% oxygen Induction Type: IV induction LMA: LMA inserted LMA Size: 4.0 Number of attempts: 1 Placement Confirmation: positive ETCO2 and breath sounds checked- equal and bilateral Tube secured with: Tape Dental Injury: Teeth and Oropharynx as per pre-operative assessment

## 2017-06-19 NOTE — Transfer of Care (Signed)
Immediate Anesthesia Transfer of Care Note  Patient: [name]  Procedure(s) Performed: Procedure(s): REVISION LEFT ABOVE KNEE AMPUTATION (Left) APPLICATION OF WOUND VAC (Left)  Patient Location: PACU  Anesthesia Type:General  Level of Consciousness: awake, alert  and patient cooperative  Airway & Oxygen Therapy: Patient Spontanous Breathing and Patient connected to face mask oxygen  Post-op Assessment: Report given to RN, Post -op Vital signs reviewed and stable, Patient moving all extremities X 4 and Patient able to stick tongue midline  Post vital signs: Reviewed and stable  Last Vitals:  Vitals:   06/19/17 1246 06/19/17 1247  BP:  112/73  Pulse:  87  Resp:  18  Temp: (!) 36.4 C   SpO2:  100%    Last Pain:  Vitals:   06/19/17 1012  TempSrc: Oral      Patients Stated Pain Goal: 3 (06/19/17 1020)  Complications: No apparent anesthesia complications

## 2017-06-19 NOTE — H&P (Signed)
[name] is an 36 y.o. female.   Chief Complaint: Painful ulcer left by HPI: The patient is a 36 year old woman who presents today status post left above-the-knee amputation on July 6 of this year. The patient is unable to provide a good history due to history of anoxic brain injury.   Is seen in follow up for evaluation of infection with continued drainage to left AKA. she's been having erythema drainage increased tenderness. Review of medical records sent with patient, appears to be on no oral antibiotics, has completed a Doxycycline course.   No systemic symptoms. WBC on 06/15/17 9.2  Past Medical History:  Diagnosis Date  . Anemia   . Arthritis    "left hand" (09/15/2013)  . Asthma   . CHF (congestive heart failure) (HCC)   . Chronic bronchitis (HCC)    "just about q yr" (09/15/2013)  . Chronic kidney disease    "low kidney function" (09/15/2013) ,  T/Th/Sa  . COPD (chronic obstructive pulmonary disease) (HCC)   . Coronary artery disease   . Hyperlipidemia   . Hypertension   . Migraine    "get them alot" (09/15/2013)  . Myocardial infarction [facility]) 04/2015   NSTEMI  . Normal coronary arteries    by cardiac catheterization 09/20/13  . Peripheral vascular disease (HCC)   . Pneumonia    "couple times; have it now" (09/15/2013)  . PONV (postoperative nausea and vomiting)   . Restless legs   . Shortness of breath    "just recently; related to the pneumonia" (09/15/2013)  . Type 1 diabetes mellitus (HCC)    type 2    Past Surgical History:  Procedure Laterality Date  . AMPUTATION Right 06/25/2016   Procedure: Amputation Right Great Toe at the Metatarsophalangeal Joint;  Surgeon: Nadara Mustard, MD;  Location: [facility] OR;  Service: Orthopedics;  Laterality: Right;  . AMPUTATION Bilateral 10/08/2016   Procedure: Bilateral Transmetatarsal Amputation;  Surgeon: Nadara Mustard, MD;  Location: [facility] OR;  Service: Orthopedics;  Laterality: Bilateral;  . AMPUTATION Left 03/13/2017   Procedure: AMPUTATION ABOVE KNEE;  Surgeon: Nadara Mustard, MD;  Location: [facility] OR;  Service: Orthopedics;  Laterality: Left;  . APPLICATION OF WOUND VAC Left 04/03/2017   Procedure: Application of Wound Vacuum;  Surgeon: Nadara Mustard, MD;  Location: [facility] OR;  Service: Orthopedics;  Laterality: Left;  . AV FISTULA PLACEMENT Left 03/27/2015   Procedure: CREATION RADIAL CEPHALIC ARTERIOVENOUS FISTULA;  Surgeon: Chuck Hint, MD;  Location: [facility] OR;  Service: Vascular;  Laterality: Left;  . AV FISTULA PLACEMENT Left 11/23/2015   Procedure:  LEFT ARM BASILIC VEIN TRANSPOSITION  ;  Surgeon: Chuck Hint, MD;  Location: [facility] OR;  Service: Vascular;  Laterality: Left;  . CESAREAN SECTION  1999; 2006  . CORONARY ANGIOGRAM  09/20/2013   Procedure: CORONARY ANGIOGRAM;  Surgeon: Runell Gess, MD;  Location: [facility] CATH LAB;  Service: Cardiovascular;;  . FINGER SURGERY Left 1985   3rd and 4th digits reconstructed after cut off" (09/15/2013)  . IR FLUORO GUIDE CV LINE RIGHT  03/11/2017  . IR US GUIDE VASC ACCESS RIGHT  03/11/2017  . PERIPHERAL VASCULAR CATHETERIZATION N/A 09/05/2016   Procedure: Abdominal Aortogram;  Surgeon: Sherren Kerns, MD;  Location: [facility] INVASIVE CV LAB;  Service: Cardiovascular;  Laterality: N/A;  . PERIPHERAL VASCULAR CATHETERIZATION Bilateral 09/05/2016   Procedure: Lower Extremity Angiography;  Surgeon: Sherren Kerns, MD;  Location: [facility] INVASIVE CV LAB;  Service: Cardiovascular;  Laterality: Bilateral;  .  PERIPHERAL VASCULAR CATHETERIZATION Right 09/05/2016   Procedure: Peripheral Vascular Balloon Angioplasty;  Surgeon: Sherren Kerns, MD;  Location: [facility] INVASIVE CV LAB;  Service: Cardiovascular;  Laterality: Right;  peroneal and AT  . SHOULDER ARTHROSCOPY WITH BICEPSTENOTOMY Right 05/10/2015   Procedure: RIGHT SHOULDER ARTHROSCOPY WITH BICEPS TENOTOMY, DEBRIDEMENT LABRAL TEAR;  Surgeon: Jones Broom, MD;  Location: [facility] OR;  Service: Orthopedics;  Laterality: Right;  Right  shoulder arthroscopy biceps tenotomy, debridement labral tear  . STUMP REVISION Left 01/21/2017   Procedure: . Revision Left Transmetatarsal Amputation;  Surgeon: Nadara Mustard, MD;  Location: [facility] OR;  Service: Orthopedics;  Laterality: Left;  . STUMP REVISION Left 04/03/2017   Procedure: Revision Left Above Knee Amputation;  Surgeon: Nadara Mustard, MD;  Location: [facility] OR;  Service: Orthopedics;  Laterality: Left;  . TONSILLECTOMY  1997  . TUBAL LIGATION  2006    Family History  Problem Relation Age of Onset  . Diabetes Mother   . Stroke Mother   . Hypertension Father   . Hyperlipidemia Father   . Cancer - Lung Father   . Diabetes Maternal Grandmother   . Cancer Paternal Grandmother    Social History:  reports that she has never smoked. She has never used smokeless tobacco. She reports that she does not drink alcohol or use drugs.  Allergies:  Allergies  Allergen Reactions  . Aspirin Anaphylaxis  . Sulfur Hives  . Tramadol Hives and Other (See Comments)    Pt states she feels weird   . Gabapentin Other (See Comments)    Lethargic     No prescriptions prior to admission.    No results found for this or any previous visit (from the past 48 hour(s)). No results found.  Review of Systems  All other systems reviewed and are negative.   There were no vitals taken for this visit. Physical Exam  on examination her left above-the-knee amputation has 3 ulcerative areas centrally with maceration and drainage. there is exudative tissue in wound bed. there is clear yellow drainage. No odor. No palpable deep abscess. Does have local cellulitis.  Right heel with foul odor. Decubitus ulcer with surrounding erythema and maceration. No drainage. Exquisite tenderness. Assessment/Plan Assessment: Gangrenous ulcers left above knee amputation with decubitus heel ulcer on the right.  Plan: We will plan for revision of the left above-the-knee amputation. Plan for pressure unloading PRAFO for  the right lower extremity.  Nadara Mustard, MD 06/19/2017, 6:47 AM

## 2017-06-19 NOTE — Progress Notes (Signed)
Report called to Danford Bad at [facility], patient medicated for pain but continues to keep saying "my leg hurts", per Danford Bad that is patient's baseline.

## 2017-06-19 NOTE — Op Note (Signed)
06/19/2017  12:44 PM  PATIENT:  [name]    PRE-OPERATIVE DIAGNOSIS:  Abscess Left Above Knee Amputation  POST-OPERATIVE DIAGNOSIS:  Same  PROCEDURE:  REVISION LEFT ABOVE KNEE AMPUTATION, APPLICATION OF WOUND VAC  SURGEON:  Nadara Mustard, MD  PHYSICIAN ASSISTANT:None ANESTHESIA:   General  PREOPERATIVE INDICATIONS:  [name] is a  36 y.o. female with a diagnosis of Abscess Left Above Knee Amputation who failed conservative measures and elected for surgical management.    The risks benefits and alternatives were discussed with the patient preoperatively including but not limited to the risks of infection, bleeding, nerve injury, cardiopulmonary complications, the need for revision surgery, among others, and the patient was willing to proceed.  OPERATIVE IMPLANTS: Prevena wound VAC  OPERATIVE FINDINGS: Good petechial bleeding postoperatively.  OPERATIVE PROCEDURE: Patient was brought to the operating room and underwent a general anesthetic. After adequate levels anesthesia were obtained patient's left lower extremity was prepped using DuraPrep draped into a sterile field a timeout was called. A fishmouth incision was made around the necrotic tissue over the left above-the-knee amputation. The incision was carried down to bone. The nonviable tissue was excised in 1 block of tissue. The distal 2 cm of femur were resected. Electrocautery was used for hemostasis. The tissue was cleansed. The wound was closed using 2-0 nylon. A Prevena wound VAC was applied this had a good suction fit patient was extubated taken to the PACU in stable condition.

## 2017-06-19 NOTE — Anesthesia Preprocedure Evaluation (Addendum)
Anesthesia Evaluation  Patient identified by MRN, date of birth, ID band Patient awake    Reviewed: Allergy & Precautions, H&P , NPO status , Patient's Chart, lab work & pertinent test results, reviewed documented beta blocker date and time   History of Anesthesia Complications (+) PONV  Airway Mallampati: II  TM Distance: >3 FB Neck ROM: Full    Dental no notable dental hx. (+) Teeth Intact, Dental Advisory Given   Pulmonary asthma , COPD,  COPD inhaler,    Pulmonary exam normal breath sounds clear to auscultation       Cardiovascular hypertension, Pt. on medications and Pt. on home beta blockers + Past MI, + Peripheral Vascular Disease and +CHF   Rhythm:Regular Rate:Normal     Neuro/Psych  Headaches, H/o anoxic brain injury negative psych ROS   GI/Hepatic negative GI ROS, Neg liver ROS,   Endo/Other  diabetes, Insulin Dependent  Renal/GU Renal InsufficiencyRenal disease  negative genitourinary   Musculoskeletal  (+) Arthritis , Osteoarthritis,    Abdominal   Peds  Hematology negative hematology ROS (+) anemia ,   Anesthesia Other Findings   Reproductive/Obstetrics negative OB ROS                           Anesthesia Physical Anesthesia Plan  ASA: III  Anesthesia Plan: General   Post-op Pain Management:    Induction: Intravenous  PONV Risk Score and Plan: 4 or greater and Ondansetron, Dexamethasone and Midazolam  Airway Management Planned: LMA  Additional Equipment:   Intra-op Plan:   Post-operative Plan: Extubation in OR  Informed Consent: I have reviewed the patients History and Physical, chart, labs and discussed the procedure including the risks, benefits and alternatives for the proposed anesthesia with the patient or authorized representative who has indicated his/her understanding and acceptance.   Dental advisory given  Plan Discussed with: CRNA  Anesthesia  Plan Comments:         Anesthesia Quick Evaluation

## 2017-06-19 NOTE — Progress Notes (Signed)
Prevena Plus canister changed 3x. Four more Prevena canisters sent with patient.

## 2017-06-19 NOTE — Anesthesia Postprocedure Evaluation (Signed)
Anesthesia Post Note  Patient: [name]  Procedure(s) Performed: Procedure(s) (LRB): REVISION LEFT ABOVE KNEE AMPUTATION (Left) APPLICATION OF WOUND VAC (Left)     Patient location during evaluation: PACU Anesthesia Type: General Level of consciousness: awake and alert Pain management: pain level controlled Vital Signs Assessment: post-procedure vital signs reviewed and stable Respiratory status: spontaneous breathing, nonlabored ventilation and respiratory function stable Cardiovascular status: blood pressure returned to baseline and stable Postop Assessment: no apparent nausea or vomiting Anesthetic complications: no    Last Vitals:  Vitals:   06/19/17 1324 06/19/17 1330  BP:  126/76  Pulse: 90 91  Resp: 20 19  Temp:  (!) 36 C  SpO2: 100% 100%    Last Pain:  Vitals:   06/19/17 1247  TempSrc:   PainSc: 0-No pain                 [name],[name]

## 2017-06-20 ENCOUNTER — Encounter (HOSPITAL_COMMUNITY): Payer: Self-pay | Admitting: Orthopedic Surgery

## 2017-06-29 DEATH — deceased

## 2017-07-14 MED FILL — Hydromorphone HCl Inj 1 MG/ML: INTRAMUSCULAR | Qty: 0.25 | Status: AC

## 2017-07-24 ENCOUNTER — Ambulatory Visit (INDEPENDENT_AMBULATORY_CARE_PROVIDER_SITE_OTHER): Payer: Medicaid Other | Admitting: Family

## 2018-05-08 IMAGING — US US ABDOMEN LIMITED
1 series · 14 of 25 positions shown · non-contrast
Comparison: None.

CLINICAL DATA: Elevated liver function studies.

EXAM:
US ABDOMEN LIMITED - RIGHT UPPER QUADRANT

[Series 1: us abdomen limited · 0.25mm/px · 14 of 52 slices shown]
[im 1/52]
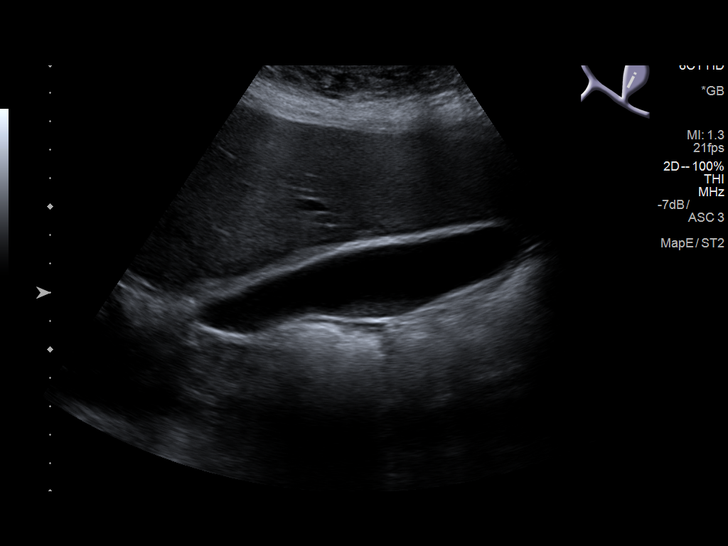
[im 5/52]
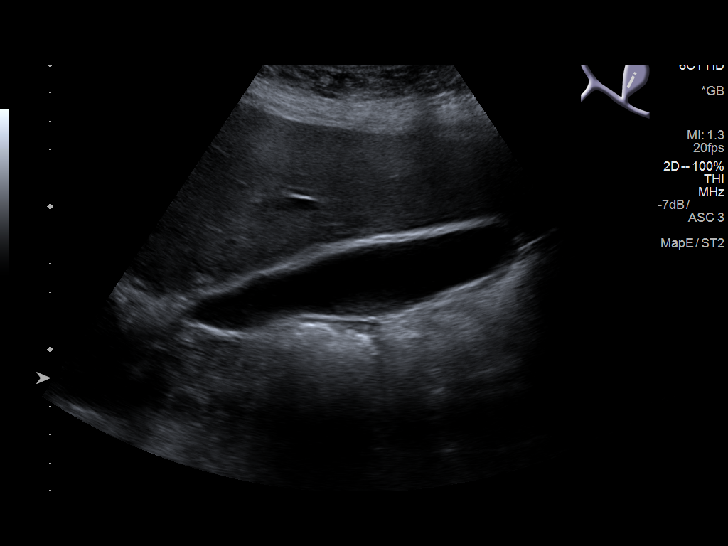
[im 9/52]
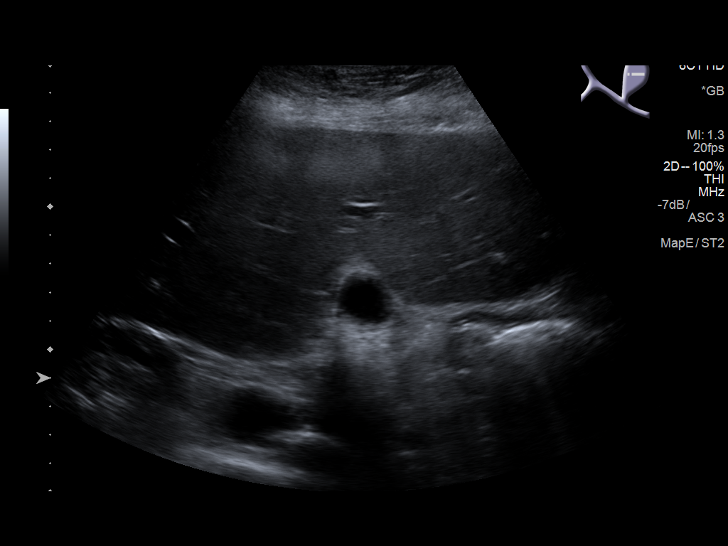
[im 13/52]
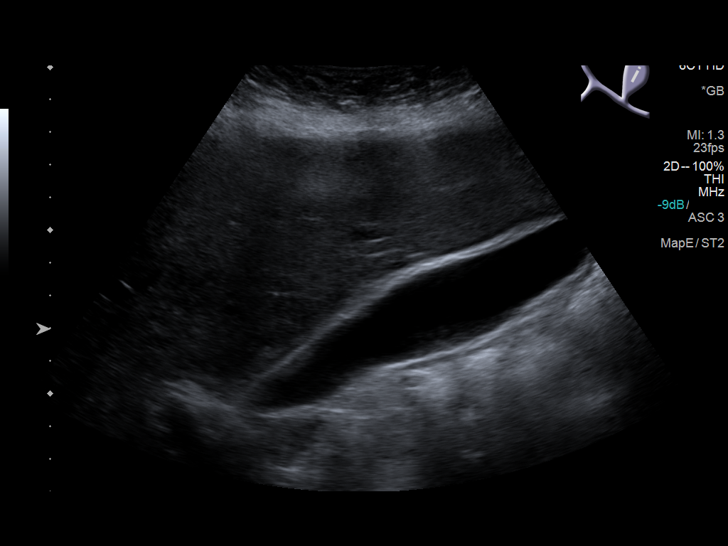
[im 18/52]
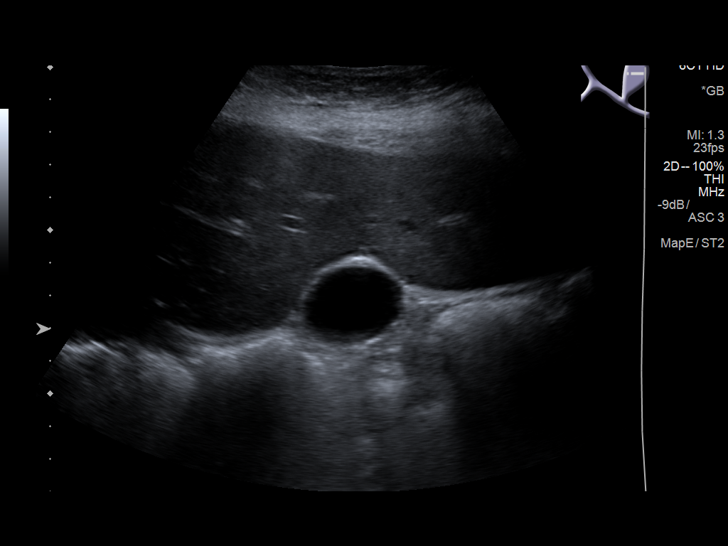
[im 20/52]
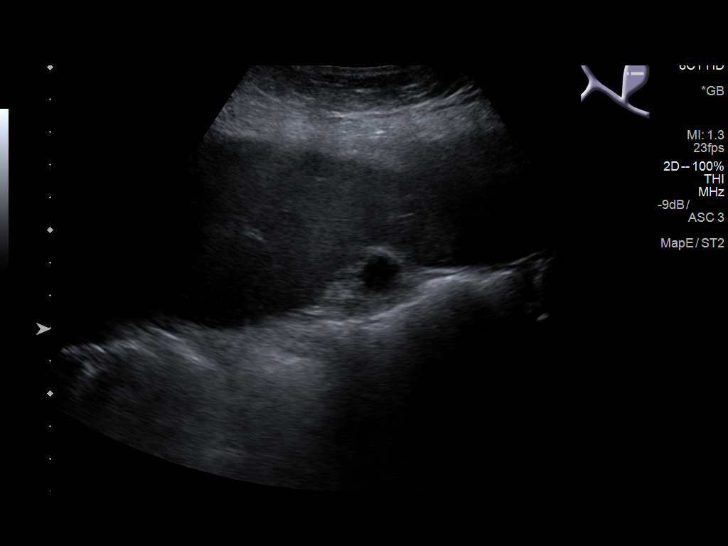
[im 24/52]
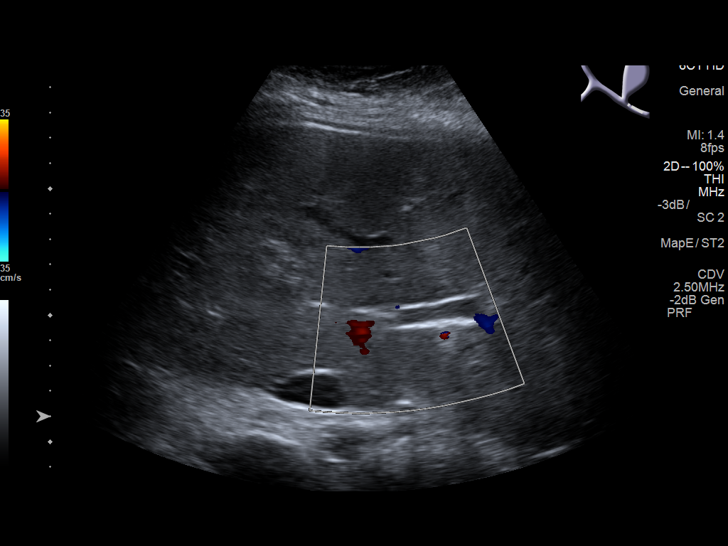
[im 28/52]
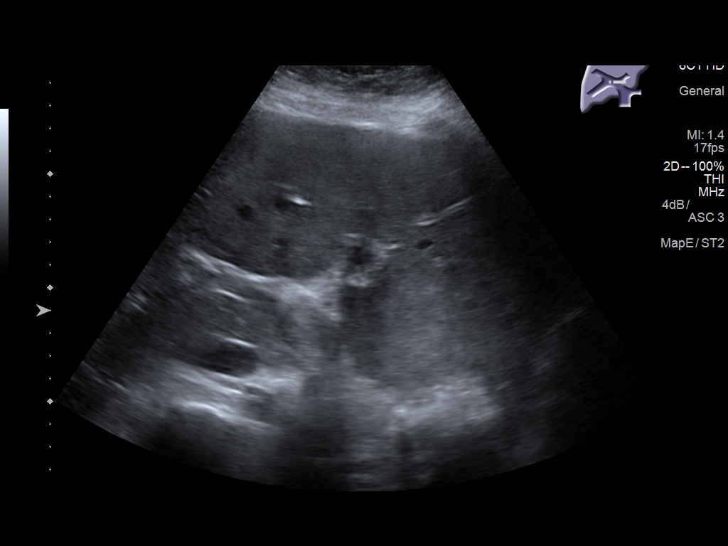
[im 32/52]
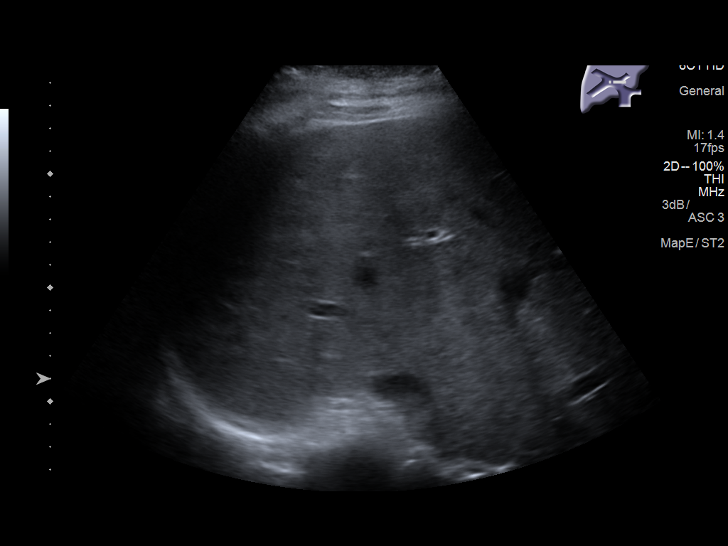
[im 35/52]
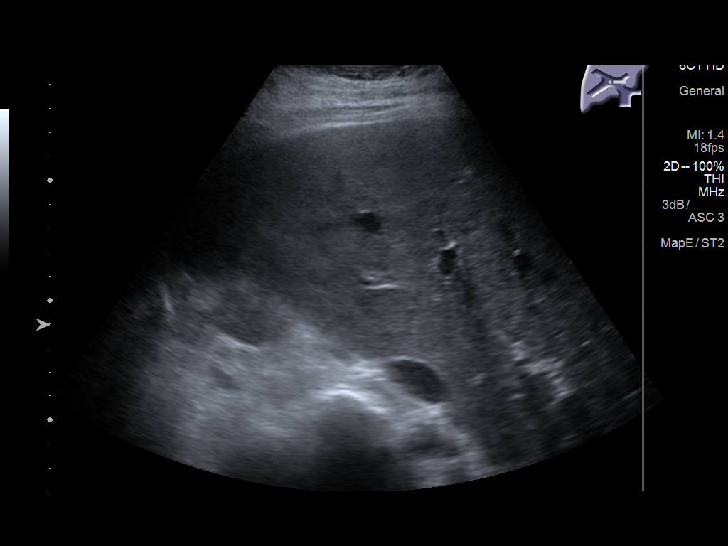
[im 39/52]
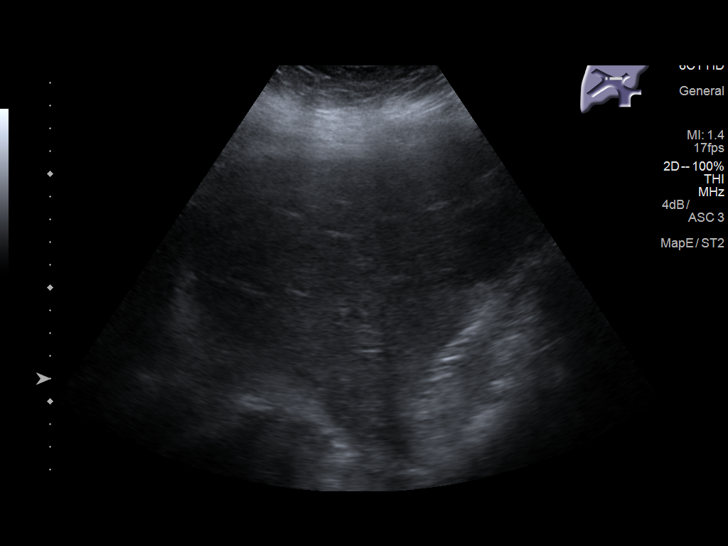
[im 43/52]
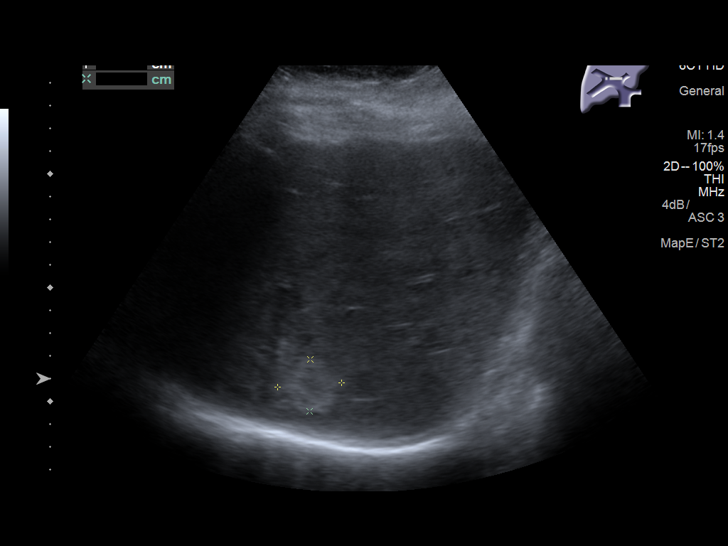
[im 47/52]
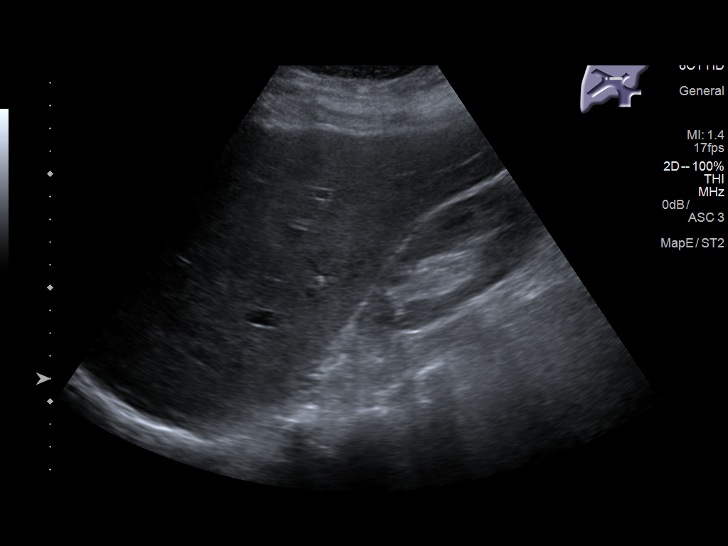
[im 52/52]
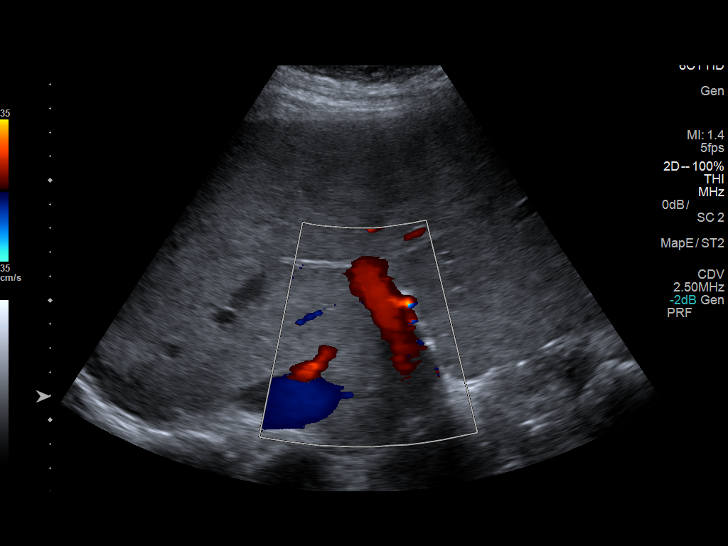

[14 of 25 positions shown; findings below may reference images not displayed]

FINDINGS: Gallbladder:

No gallstones or wall thickening visualized. No sonographic Murphy
sign noted by sonographer.

Common bile duct:

Diameter: 7 mm, normal

Liver:

Circumscribed hyperechoic lesion with central hypoechoic appearance
demonstrated along the dome of the liver measuring 2.8 cm maximal
diameter. This is likely to represent a cavernous hemangioma, but
the appearance on ultrasound is somewhat atypical. Consider
follow-up with elective MRI of the liver for better
characterization.
IMPRESSION: No evidence of cholelithiasis or cholecystitis. Circumscribed
hyperechoic lesion in the dome of the liver measuring 2.8 cm maximal
diameter. This is probably a cavernous hemangioma but appearance is
somewhat atypical. Suggest elective MRI liver in follow-up for
better characterization.

## 2018-09-01 IMAGING — DX DG FOOT COMPLETE 3+V*L*
3 series · 3 of 3 positions shown · non-contrast
Comparison: 12/29/2016

CLINICAL DATA: Pt c/o severe pain and inflammation in left foot.
Revision of left transmetatarsal amputation 01/21/2017 . Weeping
evident at site of amputation. Pt reports nausea today. Hx diabetes,
HTN, CKD.

EXAM:
LEFT FOOT - COMPLETE 3+ VIEW

[foot ap]
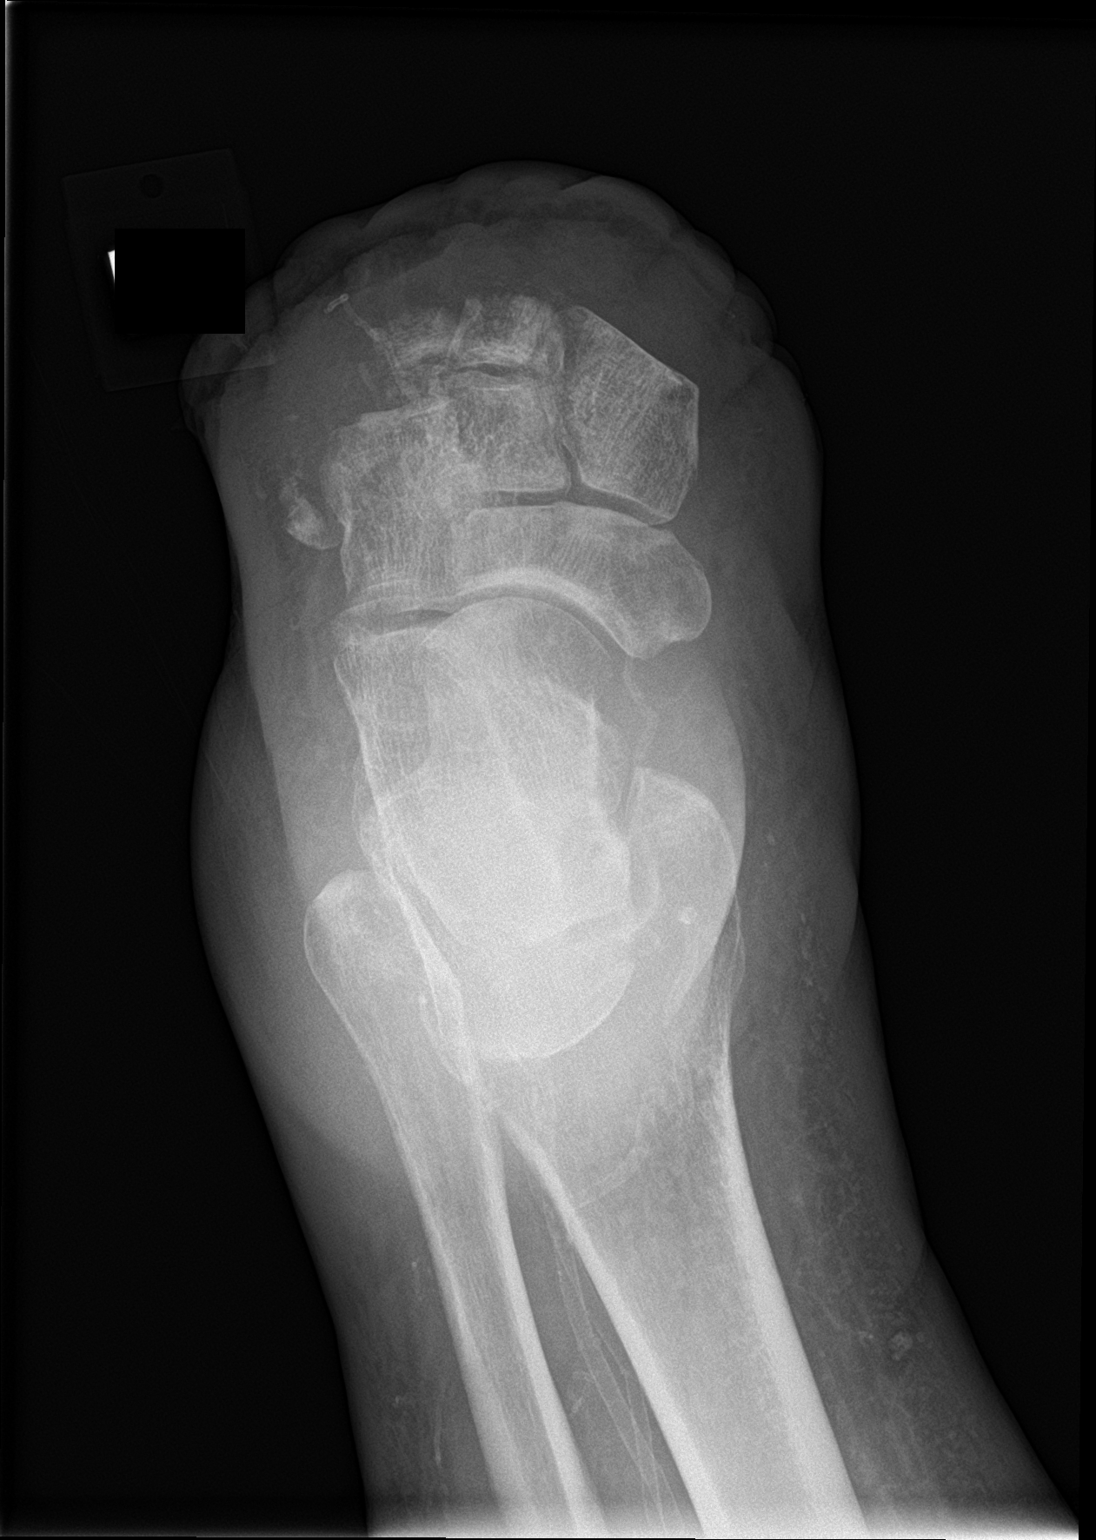

[foot obl]
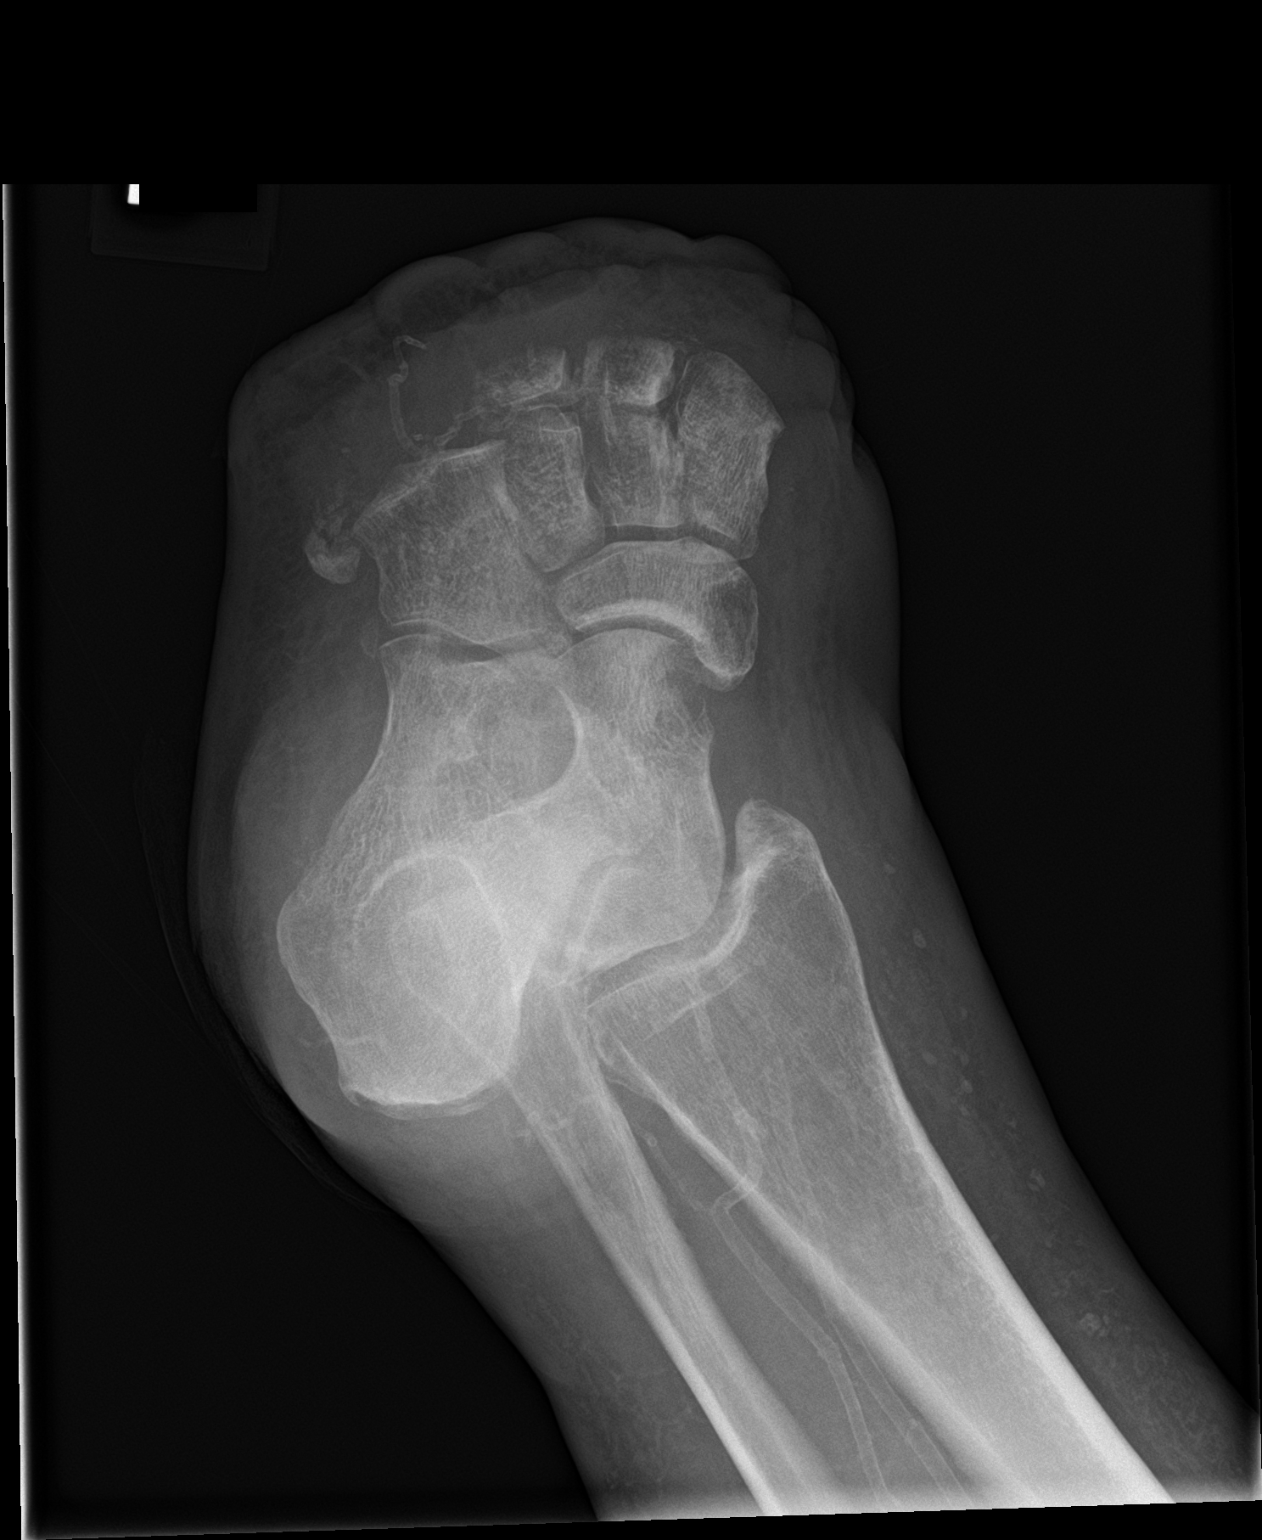

[foot lat]
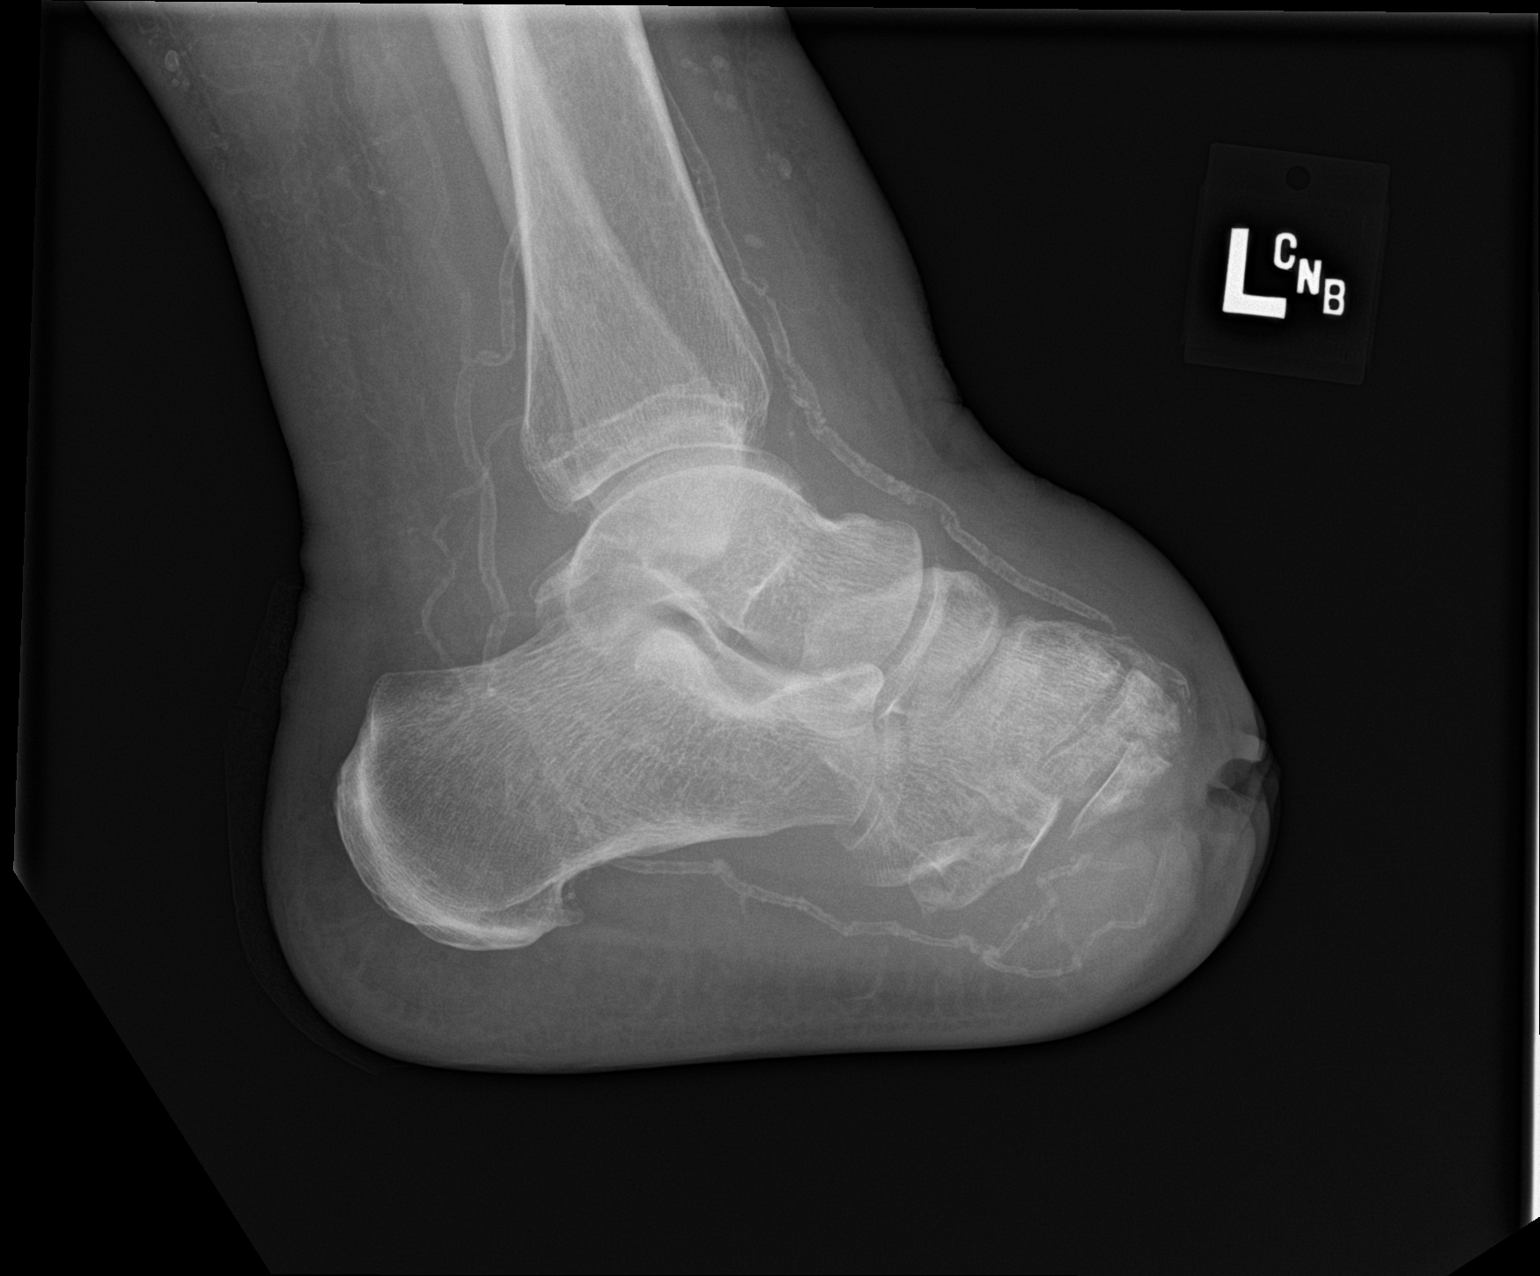

[3 of 3 positions shown; findings below may reference images not displayed]

FINDINGS: Postop changes including resection of the proximal first and fourth
metatarsals since prior exam. Small proximal fragments of the second
third and fifth metatarsals are identified. There is some
indistinctness of the resection margin of the third and fifth
metatarsal fragments suggesting osteomyelitis. There is a soft
tissue defect distal to the resection margin seen best on the
lateral projection.
IMPRESSION: 1. Interval postop changes. Indistinctness of residual third and
fifth metatarsal fragments, suggesting osteomyelitis.

## 2018-09-11 IMAGING — CR DG CHEST 1V PORT
1 series · 1 of 1 positions shown · non-contrast
Comparison: Two days ago

CLINICAL DATA: Respiratory failure

EXAM:
PORTABLE CHEST 1 VIEW

[AP]
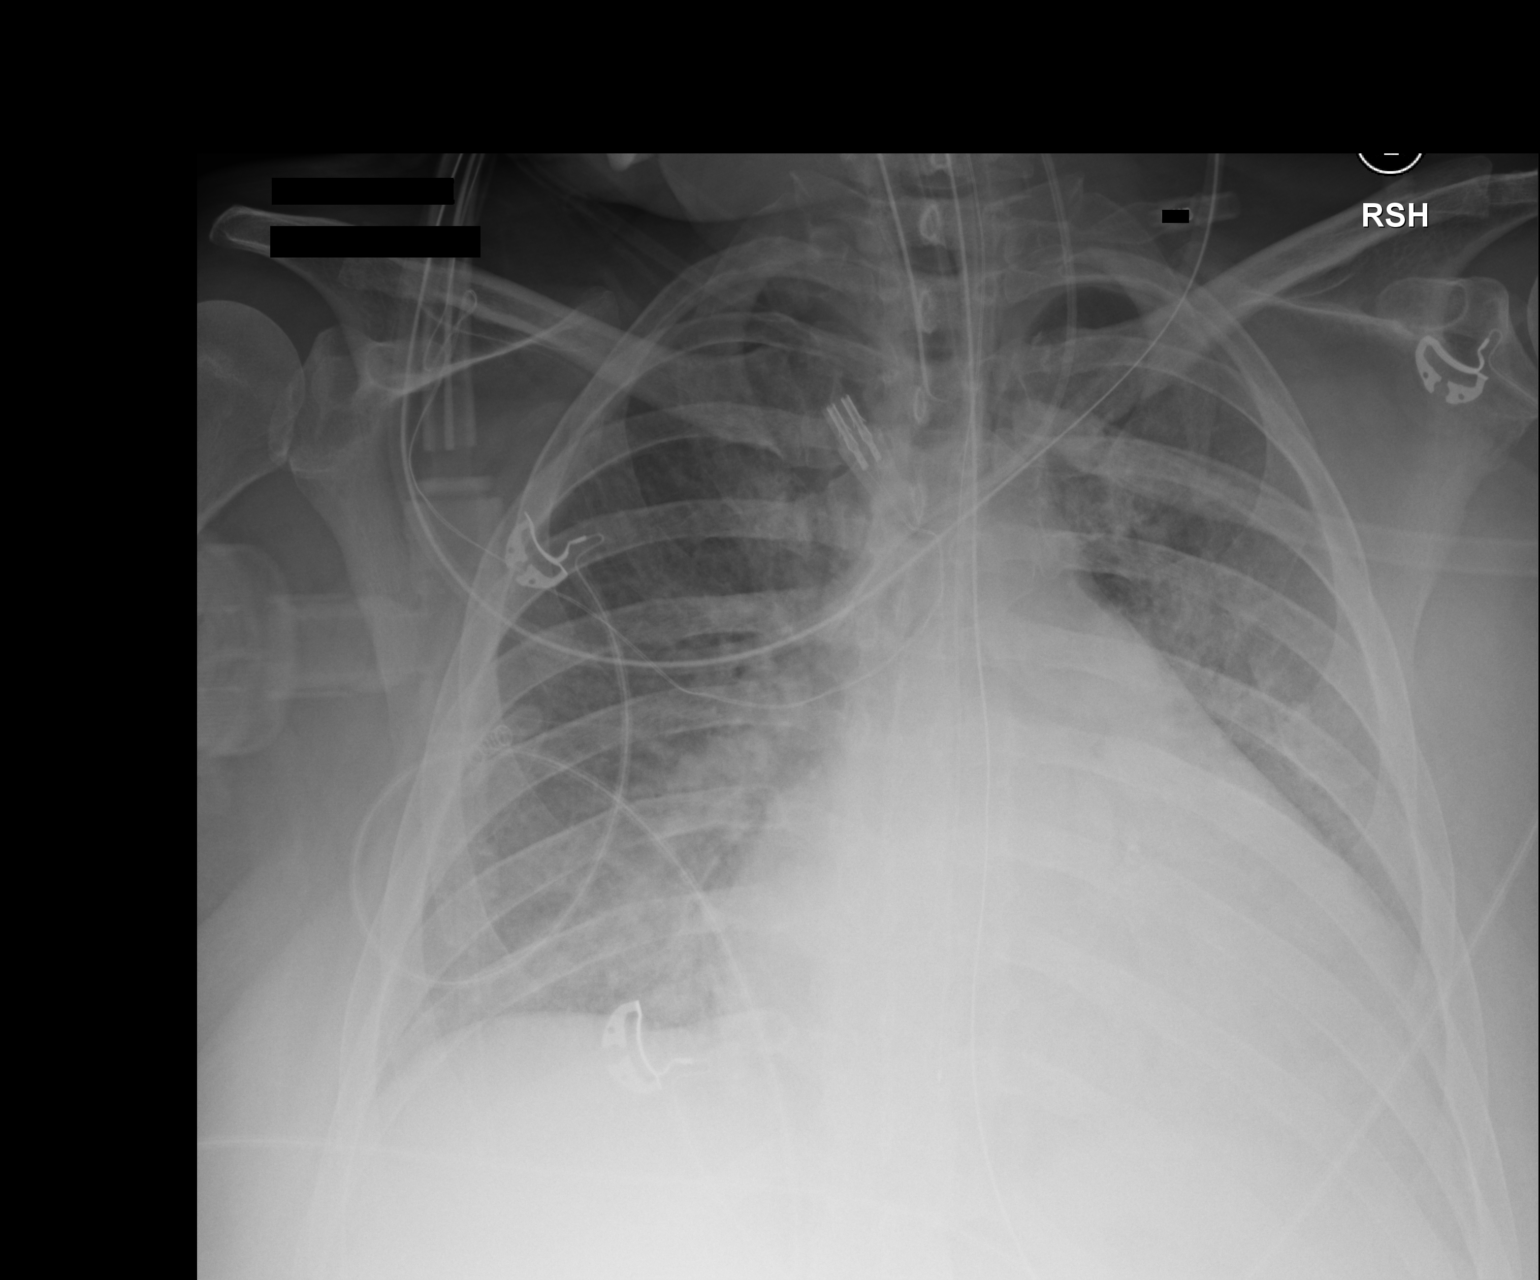

[1 of 1 positions shown; findings below may reference images not displayed]

FINDINGS: Endotracheal tube tip at the clavicular heads. An orogastric tube
reaches the stomach. Esophageal thermistor. Left IJ central line
with tip at the SVC level.

Cardiomegaly and diffuse hazy opacity with cephalized blood flow. No
definitive effusion. No pneumothorax.
IMPRESSION: 1. Stable positioning of tubes and central line.
2. CHF pattern.
